# Patient Record
Sex: Female | Born: 1940 | Race: White | Hispanic: No | Marital: Married | Smoking: Never smoker
Health system: Southern US, Community
[De-identification: ages and names within clinical notes are randomized; demographics above are authoritative.]

## PROBLEM LIST (undated history)

## (undated) ENCOUNTER — Emergency Department (HOSPITAL_COMMUNITY): Admission: EM | Discharge status: Against Medical Advice | Payer: Medicare Other

## (undated) DIAGNOSIS — Z9981 Dependence on supplemental oxygen: Secondary | ICD-10-CM

## (undated) DIAGNOSIS — I513 Intracardiac thrombosis, not elsewhere classified: Secondary | ICD-10-CM

## (undated) DIAGNOSIS — E039 Hypothyroidism, unspecified: Secondary | ICD-10-CM

## (undated) DIAGNOSIS — Z95810 Presence of automatic (implantable) cardiac defibrillator: Secondary | ICD-10-CM

## (undated) DIAGNOSIS — N184 Chronic kidney disease, stage 4 (severe): Secondary | ICD-10-CM

## (undated) DIAGNOSIS — E785 Hyperlipidemia, unspecified: Secondary | ICD-10-CM

## (undated) DIAGNOSIS — E559 Vitamin D deficiency, unspecified: Secondary | ICD-10-CM

## (undated) DIAGNOSIS — E118 Type 2 diabetes mellitus with unspecified complications: Secondary | ICD-10-CM

## (undated) DIAGNOSIS — I10 Essential (primary) hypertension: Secondary | ICD-10-CM

## (undated) DIAGNOSIS — I480 Paroxysmal atrial fibrillation: Secondary | ICD-10-CM

## (undated) DIAGNOSIS — I255 Ischemic cardiomyopathy: Secondary | ICD-10-CM

## (undated) DIAGNOSIS — I2510 Atherosclerotic heart disease of native coronary artery without angina pectoris: Secondary | ICD-10-CM

## (undated) DIAGNOSIS — I5042 Chronic combined systolic (congestive) and diastolic (congestive) heart failure: Secondary | ICD-10-CM

## (undated) DIAGNOSIS — Z950 Presence of cardiac pacemaker: Secondary | ICD-10-CM

## (undated) DIAGNOSIS — Z9989 Dependence on other enabling machines and devices: Secondary | ICD-10-CM

## (undated) DIAGNOSIS — J449 Chronic obstructive pulmonary disease, unspecified: Secondary | ICD-10-CM

## (undated) DIAGNOSIS — H6120 Impacted cerumen, unspecified ear: Secondary | ICD-10-CM

## (undated) DIAGNOSIS — R0902 Hypoxemia: Secondary | ICD-10-CM

## (undated) DIAGNOSIS — S32000A Wedge compression fracture of unspecified lumbar vertebra, initial encounter for closed fracture: Secondary | ICD-10-CM

## (undated) DIAGNOSIS — M8008XA Age-related osteoporosis with current pathological fracture, vertebra(e), initial encounter for fracture: Secondary | ICD-10-CM

## (undated) DIAGNOSIS — M549 Dorsalgia, unspecified: Secondary | ICD-10-CM

## (undated) DIAGNOSIS — G8929 Other chronic pain: Secondary | ICD-10-CM

## (undated) DIAGNOSIS — I447 Left bundle-branch block, unspecified: Secondary | ICD-10-CM

## (undated) DIAGNOSIS — G4733 Obstructive sleep apnea (adult) (pediatric): Secondary | ICD-10-CM

## (undated) DIAGNOSIS — R32 Unspecified urinary incontinence: Secondary | ICD-10-CM

## (undated) DIAGNOSIS — M109 Gout, unspecified: Secondary | ICD-10-CM

## (undated) DIAGNOSIS — E13319 Other specified diabetes mellitus with unspecified diabetic retinopathy without macular edema: Secondary | ICD-10-CM

## (undated) DIAGNOSIS — Z6841 Body Mass Index (BMI) 40.0 and over, adult: Secondary | ICD-10-CM

## (undated) DIAGNOSIS — C8595 Non-Hodgkin lymphoma, unspecified, lymph nodes of inguinal region and lower limb: Secondary | ICD-10-CM

## (undated) DIAGNOSIS — Z8781 Personal history of (healed) traumatic fracture: Secondary | ICD-10-CM

## (undated) DIAGNOSIS — D649 Anemia, unspecified: Secondary | ICD-10-CM

## (undated) DIAGNOSIS — I6529 Occlusion and stenosis of unspecified carotid artery: Secondary | ICD-10-CM

## (undated) HISTORY — DX: Left bundle-branch block, unspecified: I44.7

## (undated) HISTORY — DX: Chronic obstructive pulmonary disease, unspecified: J44.9

## (undated) HISTORY — DX: Hypoxemia: Z99.81

## (undated) HISTORY — DX: Dorsalgia, unspecified: M54.9

## (undated) HISTORY — DX: Essential (primary) hypertension: I10

## (undated) HISTORY — DX: Age-related osteoporosis with current pathological fracture, vertebra(e), initial encounter for fracture: M80.08XA

## (undated) HISTORY — DX: Hyperlipidemia, unspecified: E78.5

## (undated) HISTORY — DX: Hypoxemia: R09.02

## (undated) HISTORY — DX: Vitamin D deficiency, unspecified: E55.9

## (undated) HISTORY — DX: Ischemic cardiomyopathy: I25.5

## (undated) HISTORY — DX: Impacted cerumen, unspecified ear: H61.20

## (undated) HISTORY — DX: Hypercalcemia: E83.52

## (undated) HISTORY — DX: Unspecified urinary incontinence: R32

## (undated) HISTORY — PX: REFRACTIVE SURGERY: SHX103

## (undated) HISTORY — PX: INSERT / REPLACE / REMOVE PACEMAKER: SUR710

## (undated) HISTORY — DX: Occlusion and stenosis of unspecified carotid artery: I65.29

## (undated) HISTORY — PX: CATARACT EXTRACTION: SUR2

## (undated) HISTORY — DX: Presence of automatic (implantable) cardiac defibrillator: Z95.810

## (undated) HISTORY — DX: Other chronic pain: G89.29

## (undated) HISTORY — DX: Personal history of (healed) traumatic fracture: Z87.81

## (undated) HISTORY — DX: Anemia, unspecified: D64.9

## (undated) HISTORY — DX: Other specified diabetes mellitus with unspecified diabetic retinopathy without macular edema: E13.319

---

## 1968-05-14 HISTORY — PX: CHOLECYSTECTOMY: SHX55

## 1970-05-14 HISTORY — PX: TUBAL LIGATION: SHX77

## 1980-05-14 HISTORY — PX: ABDOMINAL HYSTERECTOMY: SHX81

## 1997-09-17 ENCOUNTER — Encounter: Admission: RE | Admit: 1997-09-17 | Discharge: 1997-12-16 | Payer: Self-pay | Admitting: Internal Medicine

## 1999-03-12 ENCOUNTER — Emergency Department (HOSPITAL_COMMUNITY): Admission: EM | Admit: 1999-03-12 | Discharge: 1999-03-12 | Payer: Self-pay | Admitting: Emergency Medicine

## 1999-03-14 ENCOUNTER — Emergency Department (HOSPITAL_COMMUNITY): Admission: EM | Admit: 1999-03-14 | Discharge: 1999-03-14 | Payer: Self-pay | Admitting: Emergency Medicine

## 1999-11-06 ENCOUNTER — Encounter: Admission: RE | Admit: 1999-11-06 | Discharge: 2000-02-04 | Payer: Self-pay | Admitting: Endocrinology

## 2001-05-14 HISTORY — PX: CORONARY ARTERY BYPASS GRAFT: SHX141

## 2001-06-03 ENCOUNTER — Inpatient Hospital Stay (HOSPITAL_COMMUNITY): Admission: AD | Admit: 2001-06-03 | Discharge: 2001-06-09 | Payer: Self-pay | Admitting: Cardiology

## 2001-06-03 ENCOUNTER — Encounter: Payer: Self-pay | Admitting: Cardiology

## 2001-06-03 ENCOUNTER — Encounter: Payer: Self-pay | Admitting: Surgery

## 2001-06-03 HISTORY — PX: CARDIAC CATHETERIZATION: SHX172

## 2001-06-04 ENCOUNTER — Encounter: Payer: Self-pay | Admitting: Surgery

## 2001-06-05 ENCOUNTER — Encounter: Payer: Self-pay | Admitting: Surgery

## 2001-06-06 ENCOUNTER — Encounter: Payer: Self-pay | Admitting: Surgery

## 2001-07-08 ENCOUNTER — Encounter (HOSPITAL_COMMUNITY): Admission: RE | Admit: 2001-07-08 | Discharge: 2001-10-06 | Payer: Self-pay | Admitting: Cardiology

## 2001-08-11 ENCOUNTER — Ambulatory Visit (HOSPITAL_BASED_OUTPATIENT_CLINIC_OR_DEPARTMENT_OTHER): Admission: RE | Admit: 2001-08-11 | Discharge: 2001-08-11 | Payer: Self-pay | Admitting: Cardiology

## 2001-12-02 ENCOUNTER — Ambulatory Visit (HOSPITAL_BASED_OUTPATIENT_CLINIC_OR_DEPARTMENT_OTHER): Admission: RE | Admit: 2001-12-02 | Discharge: 2001-12-02 | Payer: Self-pay | Admitting: Pulmonary Disease

## 2001-12-18 ENCOUNTER — Emergency Department (HOSPITAL_COMMUNITY): Admission: EM | Admit: 2001-12-18 | Discharge: 2001-12-18 | Payer: Self-pay

## 2002-04-15 ENCOUNTER — Encounter: Admission: RE | Admit: 2002-04-15 | Discharge: 2002-07-14 | Payer: Self-pay | Admitting: Pulmonary Disease

## 2002-04-22 ENCOUNTER — Ambulatory Visit: Admission: RE | Admit: 2002-04-22 | Discharge: 2002-04-22 | Payer: Self-pay | Admitting: Cardiology

## 2002-04-29 ENCOUNTER — Encounter (HOSPITAL_COMMUNITY): Admission: RE | Admit: 2002-04-29 | Discharge: 2002-07-28 | Payer: Self-pay | Admitting: Cardiology

## 2003-09-30 ENCOUNTER — Encounter
Admission: RE | Admit: 2003-09-30 | Discharge: 2003-09-30 | Payer: Self-pay | Admitting: Physical Medicine and Rehabilitation

## 2003-10-18 ENCOUNTER — Ambulatory Visit (HOSPITAL_COMMUNITY): Admission: RE | Admit: 2003-10-18 | Discharge: 2003-10-18 | Payer: Self-pay | Admitting: Endocrinology

## 2004-01-10 ENCOUNTER — Emergency Department (HOSPITAL_COMMUNITY): Admission: EM | Admit: 2004-01-10 | Discharge: 2004-01-10 | Payer: Self-pay | Admitting: Emergency Medicine

## 2004-03-23 ENCOUNTER — Ambulatory Visit: Payer: Self-pay | Admitting: Internal Medicine

## 2004-03-31 ENCOUNTER — Ambulatory Visit: Payer: Self-pay | Admitting: Cardiology

## 2004-04-20 ENCOUNTER — Ambulatory Visit: Payer: Self-pay | Admitting: Internal Medicine

## 2004-04-20 ENCOUNTER — Ambulatory Visit: Payer: Self-pay | Admitting: Cardiology

## 2004-05-02 ENCOUNTER — Ambulatory Visit: Payer: Self-pay | Admitting: Cardiology

## 2004-05-12 ENCOUNTER — Ambulatory Visit: Payer: Self-pay | Admitting: Internal Medicine

## 2004-05-23 ENCOUNTER — Ambulatory Visit: Payer: Self-pay | Admitting: Internal Medicine

## 2004-05-30 ENCOUNTER — Ambulatory Visit: Payer: Self-pay

## 2004-06-07 ENCOUNTER — Ambulatory Visit: Payer: Self-pay | Admitting: Cardiology

## 2004-06-14 ENCOUNTER — Ambulatory Visit: Payer: Self-pay | Admitting: Internal Medicine

## 2004-07-05 ENCOUNTER — Ambulatory Visit: Payer: Self-pay | Admitting: Cardiology

## 2004-07-05 ENCOUNTER — Ambulatory Visit: Payer: Self-pay | Admitting: Internal Medicine

## 2004-07-13 ENCOUNTER — Ambulatory Visit: Payer: Self-pay | Admitting: Cardiology

## 2004-07-20 ENCOUNTER — Ambulatory Visit: Payer: Self-pay | Admitting: *Deleted

## 2004-07-25 ENCOUNTER — Ambulatory Visit: Payer: Self-pay | Admitting: Internal Medicine

## 2004-08-10 ENCOUNTER — Ambulatory Visit: Payer: Self-pay | Admitting: Internal Medicine

## 2004-09-07 ENCOUNTER — Ambulatory Visit: Payer: Self-pay | Admitting: Cardiology

## 2004-09-07 ENCOUNTER — Ambulatory Visit: Payer: Self-pay | Admitting: Internal Medicine

## 2004-09-21 ENCOUNTER — Ambulatory Visit: Payer: Self-pay | Admitting: Internal Medicine

## 2004-10-05 ENCOUNTER — Ambulatory Visit: Payer: Self-pay | Admitting: Cardiology

## 2004-10-23 ENCOUNTER — Ambulatory Visit: Payer: Self-pay | Admitting: Internal Medicine

## 2004-10-25 ENCOUNTER — Ambulatory Visit: Payer: Self-pay | Admitting: Internal Medicine

## 2004-11-07 ENCOUNTER — Ambulatory Visit: Payer: Self-pay | Admitting: Cardiology

## 2004-11-21 ENCOUNTER — Ambulatory Visit: Payer: Self-pay | Admitting: *Deleted

## 2004-11-28 ENCOUNTER — Ambulatory Visit: Payer: Self-pay | Admitting: Cardiology

## 2004-12-06 ENCOUNTER — Ambulatory Visit: Payer: Self-pay | Admitting: Cardiology

## 2004-12-13 ENCOUNTER — Ambulatory Visit: Payer: Self-pay | Admitting: *Deleted

## 2004-12-20 ENCOUNTER — Ambulatory Visit: Payer: Self-pay | Admitting: Cardiology

## 2004-12-27 ENCOUNTER — Ambulatory Visit: Payer: Self-pay | Admitting: Cardiology

## 2005-01-02 ENCOUNTER — Ambulatory Visit: Payer: Self-pay | Admitting: Cardiology

## 2005-01-12 ENCOUNTER — Ambulatory Visit: Payer: Self-pay | Admitting: *Deleted

## 2005-01-23 ENCOUNTER — Ambulatory Visit: Payer: Self-pay | Admitting: Internal Medicine

## 2005-01-23 ENCOUNTER — Ambulatory Visit: Payer: Self-pay

## 2005-01-29 ENCOUNTER — Emergency Department (HOSPITAL_COMMUNITY): Admission: EM | Admit: 2005-01-29 | Discharge: 2005-01-29 | Payer: Self-pay | Admitting: Emergency Medicine

## 2005-01-30 ENCOUNTER — Ambulatory Visit: Payer: Self-pay | Admitting: Internal Medicine

## 2005-02-05 ENCOUNTER — Ambulatory Visit: Payer: Self-pay | Admitting: Internal Medicine

## 2005-02-19 ENCOUNTER — Ambulatory Visit: Payer: Self-pay | Admitting: Cardiology

## 2005-03-01 ENCOUNTER — Ambulatory Visit: Payer: Self-pay | Admitting: Internal Medicine

## 2005-03-06 ENCOUNTER — Ambulatory Visit: Payer: Self-pay | Admitting: Internal Medicine

## 2005-03-15 ENCOUNTER — Ambulatory Visit: Payer: Self-pay | Admitting: Internal Medicine

## 2005-03-16 ENCOUNTER — Ambulatory Visit: Payer: Self-pay | Admitting: Cardiology

## 2005-03-30 ENCOUNTER — Ambulatory Visit: Payer: Self-pay | Admitting: Cardiology

## 2005-04-13 ENCOUNTER — Encounter: Payer: Self-pay | Admitting: Internal Medicine

## 2005-04-13 ENCOUNTER — Ambulatory Visit (HOSPITAL_COMMUNITY): Admission: RE | Admit: 2005-04-13 | Discharge: 2005-04-13 | Payer: Self-pay | Admitting: Cardiology

## 2005-04-26 ENCOUNTER — Ambulatory Visit: Payer: Self-pay | Admitting: *Deleted

## 2005-05-15 ENCOUNTER — Ambulatory Visit: Payer: Self-pay | Admitting: Internal Medicine

## 2005-05-17 ENCOUNTER — Ambulatory Visit: Payer: Self-pay | Admitting: Internal Medicine

## 2005-05-23 ENCOUNTER — Ambulatory Visit: Payer: Self-pay | Admitting: *Deleted

## 2005-05-25 ENCOUNTER — Ambulatory Visit: Payer: Self-pay | Admitting: Internal Medicine

## 2005-05-31 ENCOUNTER — Ambulatory Visit: Payer: Self-pay | Admitting: Cardiology

## 2005-06-07 ENCOUNTER — Ambulatory Visit: Payer: Self-pay | Admitting: Cardiology

## 2005-06-15 ENCOUNTER — Ambulatory Visit: Payer: Self-pay | Admitting: Internal Medicine

## 2005-06-15 ENCOUNTER — Ambulatory Visit: Payer: Self-pay | Admitting: Cardiology

## 2005-06-29 ENCOUNTER — Ambulatory Visit: Payer: Self-pay | Admitting: Cardiology

## 2005-06-29 ENCOUNTER — Ambulatory Visit: Payer: Self-pay | Admitting: Internal Medicine

## 2005-07-02 ENCOUNTER — Ambulatory Visit: Payer: Self-pay | Admitting: Internal Medicine

## 2005-07-05 ENCOUNTER — Ambulatory Visit: Payer: Self-pay | Admitting: Internal Medicine

## 2005-07-12 ENCOUNTER — Ambulatory Visit: Payer: Self-pay

## 2005-07-16 ENCOUNTER — Ambulatory Visit: Payer: Self-pay | Admitting: Internal Medicine

## 2005-07-23 ENCOUNTER — Ambulatory Visit: Payer: Self-pay | Admitting: Internal Medicine

## 2005-07-24 ENCOUNTER — Ambulatory Visit: Payer: Self-pay | Admitting: *Deleted

## 2005-08-02 ENCOUNTER — Ambulatory Visit: Payer: Self-pay | Admitting: Internal Medicine

## 2005-08-02 ENCOUNTER — Ambulatory Visit (HOSPITAL_COMMUNITY): Admission: RE | Admit: 2005-08-02 | Discharge: 2005-08-03 | Payer: Self-pay | Admitting: Internal Medicine

## 2005-08-06 ENCOUNTER — Ambulatory Visit: Payer: Self-pay | Admitting: Internal Medicine

## 2005-08-09 ENCOUNTER — Encounter: Payer: Self-pay | Admitting: Internal Medicine

## 2005-08-09 ENCOUNTER — Ambulatory Visit: Payer: Self-pay

## 2005-08-14 ENCOUNTER — Ambulatory Visit: Payer: Self-pay | Admitting: Cardiology

## 2005-08-15 ENCOUNTER — Ambulatory Visit: Payer: Self-pay

## 2005-08-30 ENCOUNTER — Ambulatory Visit: Payer: Self-pay | Admitting: Internal Medicine

## 2005-09-12 ENCOUNTER — Ambulatory Visit: Payer: Self-pay | Admitting: Internal Medicine

## 2005-09-25 ENCOUNTER — Ambulatory Visit: Payer: Self-pay | Admitting: Cardiology

## 2005-10-05 ENCOUNTER — Ambulatory Visit: Payer: Self-pay | Admitting: Cardiology

## 2005-10-12 ENCOUNTER — Ambulatory Visit: Payer: Self-pay | Admitting: Internal Medicine

## 2005-10-16 ENCOUNTER — Ambulatory Visit: Payer: Self-pay | Admitting: Cardiology

## 2005-10-30 ENCOUNTER — Ambulatory Visit: Payer: Self-pay | Admitting: Cardiology

## 2005-11-15 ENCOUNTER — Ambulatory Visit: Payer: Self-pay | Admitting: Cardiology

## 2005-11-15 ENCOUNTER — Ambulatory Visit: Payer: Self-pay | Admitting: Internal Medicine

## 2005-11-21 ENCOUNTER — Ambulatory Visit: Payer: Self-pay | Admitting: Internal Medicine

## 2005-11-21 ENCOUNTER — Encounter: Payer: Self-pay | Admitting: Cardiology

## 2005-11-30 ENCOUNTER — Ambulatory Visit: Payer: Self-pay | Admitting: Cardiology

## 2005-12-12 ENCOUNTER — Ambulatory Visit: Payer: Self-pay | Admitting: *Deleted

## 2005-12-24 ENCOUNTER — Ambulatory Visit: Payer: Self-pay | Admitting: Cardiovascular Disease

## 2006-01-08 ENCOUNTER — Ambulatory Visit: Payer: Self-pay | Admitting: Cardiology

## 2006-01-17 ENCOUNTER — Ambulatory Visit: Payer: Self-pay | Admitting: Internal Medicine

## 2006-01-31 ENCOUNTER — Ambulatory Visit: Payer: Self-pay | Admitting: Cardiology

## 2006-02-28 ENCOUNTER — Ambulatory Visit: Payer: Self-pay | Admitting: Internal Medicine

## 2006-03-19 ENCOUNTER — Ambulatory Visit: Payer: Self-pay | Admitting: Internal Medicine

## 2006-03-28 ENCOUNTER — Ambulatory Visit: Payer: Self-pay | Admitting: Cardiology

## 2006-04-22 ENCOUNTER — Ambulatory Visit: Payer: Self-pay | Admitting: Internal Medicine

## 2006-04-22 LAB — CONVERTED CEMR LAB
ALT: 20 units/L (ref 0–40)
AST: 24 units/L (ref 0–37)
Albumin: 3.2 g/dL — ABNORMAL LOW (ref 3.5–5.2)
BUN: 47 mg/dL — ABNORMAL HIGH (ref 6–23)
Basophils Absolute: 0 10*3/uL (ref 0.0–0.1)
Basophils Relative: 0.3 % (ref 0.0–1.0)
Bilirubin Urine: NEGATIVE
Calcium: 9.9 mg/dL (ref 8.4–10.5)
Chloride: 96 meq/L (ref 96–112)
Cholesterol: 250 mg/dL (ref 0–200)
GFR calc non Af Amer: 30 mL/min
Glucose, Bld: 276 mg/dL — ABNORMAL HIGH (ref 70–99)
HDL: 42.1 mg/dL (ref >39.0)
Hemoglobin, Urine: NEGATIVE
Iron: 90 ug/dL (ref 42–145)
LDL Cholesterol: 4 mg/dL (ref 0–99)
LDL DIRECT: 36.4 mg/dL
Monocytes Absolute: 0.6 10*3/uL (ref 0.2–0.7)
Monocytes Relative: 11.5 % — ABNORMAL HIGH (ref 3.0–11.0)
Neutro Abs: 3.7 10*3/uL (ref 1.4–7.7)
Neutrophils Relative %: 65.4 % (ref 43.0–77.0)
Phosphorus Concentration: 4.2 mg/dL (ref 2.3–4.6)
Specific Gravity, Urine: 1.01 (ref 1.000–1.03)
Triglyceride fasting, serum: 1018 mg/dL (ref 0–149)
Urine Glucose: 100 mg/dL — AB
VLDL: 204 mg/dL — ABNORMAL HIGH (ref 0–40)
pH: 6.5 (ref 5.0–8.0)

## 2006-04-25 ENCOUNTER — Ambulatory Visit: Payer: Self-pay | Admitting: Cardiology

## 2006-04-29 ENCOUNTER — Ambulatory Visit: Payer: Self-pay | Admitting: Internal Medicine

## 2006-05-23 ENCOUNTER — Ambulatory Visit: Payer: Self-pay | Admitting: Cardiology

## 2006-05-31 ENCOUNTER — Ambulatory Visit: Payer: Self-pay | Admitting: Internal Medicine

## 2006-06-20 ENCOUNTER — Ambulatory Visit: Payer: Self-pay | Admitting: Cardiology

## 2006-07-11 ENCOUNTER — Ambulatory Visit: Payer: Self-pay | Admitting: *Deleted

## 2006-07-11 ENCOUNTER — Ambulatory Visit: Payer: Self-pay | Admitting: Cardiology

## 2006-07-29 ENCOUNTER — Ambulatory Visit: Payer: Self-pay | Admitting: Cardiology

## 2006-08-01 ENCOUNTER — Ambulatory Visit: Payer: Self-pay | Admitting: Internal Medicine

## 2006-08-05 ENCOUNTER — Encounter: Payer: Self-pay | Admitting: Cardiology

## 2006-08-05 ENCOUNTER — Ambulatory Visit: Payer: Self-pay

## 2006-08-08 ENCOUNTER — Ambulatory Visit: Payer: Self-pay | Admitting: Cardiology

## 2006-08-26 ENCOUNTER — Ambulatory Visit: Payer: Self-pay | Admitting: Internal Medicine

## 2006-08-29 ENCOUNTER — Ambulatory Visit: Payer: Self-pay | Admitting: Internal Medicine

## 2006-09-02 ENCOUNTER — Ambulatory Visit: Payer: Self-pay | Admitting: Internal Medicine

## 2006-09-23 ENCOUNTER — Ambulatory Visit: Payer: Self-pay | Admitting: Internal Medicine

## 2006-10-10 ENCOUNTER — Ambulatory Visit: Payer: Self-pay | Admitting: *Deleted

## 2006-10-11 ENCOUNTER — Ambulatory Visit: Payer: Self-pay | Admitting: Internal Medicine

## 2006-10-21 ENCOUNTER — Ambulatory Visit: Payer: Self-pay | Admitting: Internal Medicine

## 2006-10-21 LAB — CONVERTED CEMR LAB
Cholesterol: 236 mg/dL (ref 0–200)
GFR calc Af Amer: 39 mL/min
GFR calc non Af Amer: 32 mL/min
Glucose, Bld: 241 mg/dL — ABNORMAL HIGH (ref 70–99)
Hgb A1c MFr Bld: 11.4 % — ABNORMAL HIGH (ref 4.6–6.0)
Sodium: 137 meq/L (ref 135–145)
Total CHOL/HDL Ratio: 6.1
Triglycerides: 880 mg/dL (ref 0–149)

## 2006-10-31 ENCOUNTER — Ambulatory Visit: Payer: Self-pay | Admitting: Internal Medicine

## 2006-11-18 ENCOUNTER — Ambulatory Visit: Payer: Self-pay | Admitting: Internal Medicine

## 2006-11-20 DIAGNOSIS — I509 Heart failure, unspecified: Secondary | ICD-10-CM | POA: Insufficient documentation

## 2006-11-20 DIAGNOSIS — I10 Essential (primary) hypertension: Secondary | ICD-10-CM

## 2006-11-21 ENCOUNTER — Ambulatory Visit: Payer: Self-pay | Admitting: Cardiovascular Disease

## 2006-11-21 ENCOUNTER — Ambulatory Visit: Payer: Self-pay | Admitting: Internal Medicine

## 2006-12-05 ENCOUNTER — Ambulatory Visit: Payer: Self-pay | Admitting: Cardiology

## 2006-12-26 ENCOUNTER — Ambulatory Visit: Payer: Self-pay | Admitting: Cardiovascular Disease

## 2007-01-09 ENCOUNTER — Ambulatory Visit: Payer: Self-pay | Admitting: Cardiology

## 2007-01-16 ENCOUNTER — Ambulatory Visit: Payer: Self-pay | Admitting: Internal Medicine

## 2007-02-06 ENCOUNTER — Ambulatory Visit: Payer: Self-pay | Admitting: Cardiology

## 2007-02-06 ENCOUNTER — Encounter: Payer: Self-pay | Admitting: Internal Medicine

## 2007-02-06 ENCOUNTER — Ambulatory Visit: Payer: Self-pay | Admitting: Internal Medicine

## 2007-03-03 ENCOUNTER — Ambulatory Visit: Payer: Self-pay | Admitting: Internal Medicine

## 2007-03-06 ENCOUNTER — Ambulatory Visit: Payer: Self-pay | Admitting: Cardiology

## 2007-03-12 ENCOUNTER — Encounter: Payer: Self-pay | Admitting: Internal Medicine

## 2007-03-19 ENCOUNTER — Ambulatory Visit: Payer: Self-pay | Admitting: Internal Medicine

## 2007-03-19 ENCOUNTER — Telehealth: Payer: Self-pay | Admitting: Internal Medicine

## 2007-03-19 DIAGNOSIS — E113299 Type 2 diabetes mellitus with mild nonproliferative diabetic retinopathy without macular edema, unspecified eye: Secondary | ICD-10-CM | POA: Insufficient documentation

## 2007-03-19 DIAGNOSIS — E11319 Type 2 diabetes mellitus with unspecified diabetic retinopathy without macular edema: Secondary | ICD-10-CM

## 2007-03-19 DIAGNOSIS — E785 Hyperlipidemia, unspecified: Secondary | ICD-10-CM | POA: Insufficient documentation

## 2007-03-19 DIAGNOSIS — B373 Candidiasis of vulva and vagina: Secondary | ICD-10-CM

## 2007-04-04 ENCOUNTER — Ambulatory Visit: Payer: Self-pay | Admitting: Internal Medicine

## 2007-04-04 ENCOUNTER — Encounter: Payer: Self-pay | Admitting: Internal Medicine

## 2007-04-11 ENCOUNTER — Encounter: Payer: Self-pay | Admitting: Internal Medicine

## 2007-04-17 ENCOUNTER — Ambulatory Visit: Payer: Self-pay | Admitting: Internal Medicine

## 2007-04-17 DIAGNOSIS — E039 Hypothyroidism, unspecified: Secondary | ICD-10-CM | POA: Insufficient documentation

## 2007-04-19 LAB — CONVERTED CEMR LAB
Alkaline Phosphatase: 53 units/L (ref 39–117)
BUN: 38 mg/dL — ABNORMAL HIGH (ref 6–23)
CO2: 30 meq/L (ref 19–32)
Creatinine,U: 110.6 mg/dL
GFR calc Af Amer: 36 mL/min
HDL: 45 mg/dL (ref >39.0)
Microalb Creat Ratio: 31.6 mg/g — ABNORMAL HIGH (ref 0.0–30.0)
Microalb, Ur: 3.5 mg/dL — ABNORMAL HIGH (ref 0.0–1.9)
Potassium: 4.3 meq/L (ref 3.5–5.1)
Sodium: 138 meq/L (ref 135–145)
TSH: 2.9 microintl units/mL (ref 0.35–5.50)
Total Protein: 6.4 g/dL (ref 6.0–8.3)
Triglycerides: 1188 mg/dL (ref 0–149)

## 2007-05-20 ENCOUNTER — Ambulatory Visit: Payer: Self-pay | Admitting: Internal Medicine

## 2007-05-20 ENCOUNTER — Telehealth: Payer: Self-pay | Admitting: Internal Medicine

## 2007-05-20 DIAGNOSIS — I6529 Occlusion and stenosis of unspecified carotid artery: Secondary | ICD-10-CM

## 2007-05-20 DIAGNOSIS — R300 Dysuria: Secondary | ICD-10-CM

## 2007-05-21 ENCOUNTER — Encounter: Payer: Self-pay | Admitting: Internal Medicine

## 2007-05-21 LAB — CONVERTED CEMR LAB
Crystals: NEGATIVE
Hemoglobin, Urine: NEGATIVE
Mucus, UA: NEGATIVE
Total Protein, Urine: NEGATIVE mg/dL

## 2007-05-22 ENCOUNTER — Ambulatory Visit: Payer: Self-pay | Admitting: Internal Medicine

## 2007-05-28 ENCOUNTER — Ambulatory Visit: Payer: Self-pay | Admitting: Cardiovascular Disease

## 2007-05-28 ENCOUNTER — Encounter: Payer: Self-pay | Admitting: Internal Medicine

## 2007-05-28 ENCOUNTER — Ambulatory Visit: Payer: Self-pay

## 2007-05-29 ENCOUNTER — Ambulatory Visit: Payer: Self-pay | Admitting: Cardiology

## 2007-05-29 ENCOUNTER — Encounter: Payer: Self-pay | Admitting: Physician Assistant

## 2007-05-29 ENCOUNTER — Ambulatory Visit: Payer: Self-pay | Admitting: Internal Medicine

## 2007-06-03 ENCOUNTER — Encounter: Payer: Self-pay | Admitting: Internal Medicine

## 2007-06-09 ENCOUNTER — Encounter: Payer: Self-pay | Admitting: Internal Medicine

## 2007-06-18 ENCOUNTER — Telehealth: Payer: Self-pay | Admitting: Internal Medicine

## 2007-06-25 ENCOUNTER — Ambulatory Visit: Payer: Self-pay | Admitting: Cardiology

## 2007-06-25 ENCOUNTER — Encounter: Payer: Self-pay | Admitting: Internal Medicine

## 2007-06-27 ENCOUNTER — Encounter: Payer: Self-pay | Admitting: Internal Medicine

## 2007-07-04 ENCOUNTER — Encounter: Payer: Self-pay | Admitting: Internal Medicine

## 2007-07-08 ENCOUNTER — Encounter: Payer: Self-pay | Admitting: Internal Medicine

## 2007-07-09 ENCOUNTER — Ambulatory Visit: Payer: Self-pay | Admitting: Internal Medicine

## 2007-07-18 ENCOUNTER — Telehealth: Payer: Self-pay | Admitting: Internal Medicine

## 2007-07-21 ENCOUNTER — Encounter: Payer: Self-pay | Admitting: Internal Medicine

## 2007-07-22 ENCOUNTER — Encounter: Payer: Self-pay | Admitting: Internal Medicine

## 2007-08-06 ENCOUNTER — Ambulatory Visit: Payer: Self-pay | Admitting: Cardiology

## 2007-08-06 ENCOUNTER — Ambulatory Visit: Payer: Self-pay | Admitting: Internal Medicine

## 2007-08-07 LAB — CONVERTED CEMR LAB
ALT: 18 units/L (ref 0–35)
AST: 26 units/L (ref 0–37)
Albumin: 3.5 g/dL (ref 3.5–5.2)
BUN: 44 mg/dL — ABNORMAL HIGH (ref 6–23)
Bilirubin Urine: NEGATIVE
CO2: 32 meq/L (ref 19–32)
Calcium: 9.7 mg/dL (ref 8.4–10.5)
Chloride: 100 meq/L (ref 96–112)
Cholesterol: 210 mg/dL (ref 0–200)
Creatinine, Ser: 1.6 mg/dL — ABNORMAL HIGH (ref 0.4–1.2)
Direct LDL: 36.1 mg/dL
HDL: 35.2 mg/dL — ABNORMAL LOW (ref >39.0)
Hemoglobin, Urine: NEGATIVE
Hgb A1c MFr Bld: 8.7 % — ABNORMAL HIGH (ref 4.6–6.0)
Nitrite: NEGATIVE
Specific Gravity, Urine: 1.015 (ref 1.000–1.03)
Total Bilirubin: 0.6 mg/dL (ref 0.3–1.2)
Total CHOL/HDL Ratio: 6
Total Protein, Urine: NEGATIVE mg/dL
Triglycerides: 1026 mg/dL (ref 0–149)
Urine Glucose: NEGATIVE mg/dL
Urobilinogen, UA: 0.2 (ref 0.0–1.0)
pH: 6 (ref 5.0–8.0)

## 2007-08-19 ENCOUNTER — Ambulatory Visit: Payer: Self-pay | Admitting: Internal Medicine

## 2007-09-01 ENCOUNTER — Encounter: Payer: Self-pay | Admitting: Internal Medicine

## 2007-09-02 ENCOUNTER — Encounter: Payer: Self-pay | Admitting: Internal Medicine

## 2007-09-02 ENCOUNTER — Ambulatory Visit: Payer: Self-pay | Admitting: Internal Medicine

## 2007-09-03 ENCOUNTER — Ambulatory Visit: Payer: Self-pay | Admitting: Cardiology

## 2007-09-22 ENCOUNTER — Encounter: Payer: Self-pay | Admitting: Internal Medicine

## 2007-10-01 ENCOUNTER — Ambulatory Visit: Payer: Self-pay | Admitting: Cardiology

## 2007-10-27 ENCOUNTER — Encounter: Payer: Self-pay | Admitting: Internal Medicine

## 2007-10-29 ENCOUNTER — Ambulatory Visit: Payer: Self-pay | Admitting: Internal Medicine

## 2007-11-10 ENCOUNTER — Encounter: Payer: Self-pay | Admitting: Internal Medicine

## 2007-11-25 ENCOUNTER — Encounter: Payer: Self-pay | Admitting: Internal Medicine

## 2007-11-26 ENCOUNTER — Ambulatory Visit: Payer: Self-pay | Admitting: Cardiology

## 2007-12-03 ENCOUNTER — Ambulatory Visit: Payer: Self-pay | Admitting: Internal Medicine

## 2007-12-15 ENCOUNTER — Ambulatory Visit: Payer: Self-pay | Admitting: Cardiology

## 2007-12-16 ENCOUNTER — Ambulatory Visit: Payer: Self-pay | Admitting: Internal Medicine

## 2007-12-16 ENCOUNTER — Telehealth: Payer: Self-pay | Admitting: Internal Medicine

## 2007-12-16 LAB — CONVERTED CEMR LAB
CO2: 32 meq/L (ref 19–32)
Chloride: 96 meq/L (ref 96–112)
Creatinine,U: 140.3 mg/dL
Direct LDL: 39.9 mg/dL
Glucose, Bld: 218 mg/dL — ABNORMAL HIGH (ref 70–99)
Hgb A1c MFr Bld: 8.8 % — ABNORMAL HIGH (ref 4.6–6.0)
Microalb Creat Ratio: 14.3 mg/g (ref 0.0–30.0)
Microalb, Ur: 2 mg/dL — ABNORMAL HIGH (ref 0.0–1.9)
Potassium: 4.4 meq/L (ref 3.5–5.1)
Sodium: 137 meq/L (ref 135–145)
Total CHOL/HDL Ratio: 6.3

## 2007-12-23 ENCOUNTER — Ambulatory Visit: Payer: Self-pay | Admitting: Internal Medicine

## 2007-12-24 ENCOUNTER — Encounter: Payer: Self-pay | Admitting: Internal Medicine

## 2008-01-12 ENCOUNTER — Ambulatory Visit: Payer: Self-pay | Admitting: Cardiology

## 2008-01-26 ENCOUNTER — Ambulatory Visit: Payer: Self-pay | Admitting: Cardiology

## 2008-01-30 ENCOUNTER — Ambulatory Visit: Payer: Self-pay | Admitting: Cardiology

## 2008-02-06 ENCOUNTER — Encounter: Payer: Self-pay | Admitting: Cardiology

## 2008-02-06 ENCOUNTER — Ambulatory Visit: Payer: Self-pay | Admitting: Cardiology

## 2008-02-10 ENCOUNTER — Encounter: Payer: Self-pay | Admitting: Internal Medicine

## 2008-02-16 ENCOUNTER — Ambulatory Visit: Payer: Self-pay | Admitting: Cardiology

## 2008-02-18 ENCOUNTER — Ambulatory Visit: Payer: Self-pay | Admitting: Internal Medicine

## 2008-02-19 ENCOUNTER — Encounter: Payer: Self-pay | Admitting: Internal Medicine

## 2008-03-01 ENCOUNTER — Ambulatory Visit: Payer: Self-pay | Admitting: Internal Medicine

## 2008-03-15 ENCOUNTER — Ambulatory Visit: Payer: Self-pay | Admitting: Internal Medicine

## 2008-03-15 ENCOUNTER — Ambulatory Visit: Payer: Self-pay | Admitting: Cardiovascular Disease

## 2008-03-15 LAB — CONVERTED CEMR LAB
ALT: 21 units/L (ref 0–35)
AST: 26 units/L (ref 0–37)
Cholesterol: 262 mg/dL (ref 0–200)
Direct LDL: 28.6 mg/dL

## 2008-03-23 ENCOUNTER — Encounter: Payer: Self-pay | Admitting: Internal Medicine

## 2008-03-24 ENCOUNTER — Ambulatory Visit: Payer: Self-pay | Admitting: Internal Medicine

## 2008-03-26 ENCOUNTER — Ambulatory Visit: Payer: Self-pay | Admitting: Cardiovascular Disease

## 2008-04-12 ENCOUNTER — Ambulatory Visit: Payer: Self-pay | Admitting: Cardiovascular Disease

## 2008-04-14 ENCOUNTER — Encounter: Payer: Self-pay | Admitting: Internal Medicine

## 2008-04-19 ENCOUNTER — Encounter: Payer: Self-pay | Admitting: Internal Medicine

## 2008-04-26 ENCOUNTER — Ambulatory Visit: Payer: Self-pay | Admitting: Cardiology

## 2008-05-04 ENCOUNTER — Ambulatory Visit: Payer: Self-pay | Admitting: Internal Medicine

## 2008-05-04 DIAGNOSIS — J018 Other acute sinusitis: Secondary | ICD-10-CM

## 2008-05-17 ENCOUNTER — Ambulatory Visit: Payer: Self-pay | Admitting: *Deleted

## 2008-05-17 ENCOUNTER — Encounter: Payer: Self-pay | Admitting: Internal Medicine

## 2008-05-17 ENCOUNTER — Ambulatory Visit: Payer: Self-pay | Admitting: Internal Medicine

## 2008-05-17 DIAGNOSIS — N3000 Acute cystitis without hematuria: Secondary | ICD-10-CM

## 2008-05-17 LAB — CONVERTED CEMR LAB

## 2008-05-21 ENCOUNTER — Encounter: Payer: Self-pay | Admitting: Internal Medicine

## 2008-05-31 ENCOUNTER — Ambulatory Visit: Payer: Self-pay | Admitting: Internal Medicine

## 2008-06-03 ENCOUNTER — Encounter: Payer: Self-pay | Admitting: Internal Medicine

## 2008-06-03 ENCOUNTER — Ambulatory Visit: Payer: Self-pay

## 2008-06-07 ENCOUNTER — Ambulatory Visit: Payer: Self-pay | Admitting: Cardiology

## 2008-06-15 ENCOUNTER — Ambulatory Visit: Payer: Self-pay | Admitting: Internal Medicine

## 2008-06-15 LAB — CONVERTED CEMR LAB
AST: 21 units/L (ref 0–37)
CO2: 30 meq/L (ref 19–32)
Chloride: 108 meq/L (ref 96–112)
Cholesterol: 196 mg/dL (ref 0–200)
Creatinine, Ser: 1.5 mg/dL — ABNORMAL HIGH (ref 0.4–1.2)
Direct LDL: 74.8 mg/dL
Glucose, Bld: 122 mg/dL — ABNORMAL HIGH (ref 70–99)
Hgb A1c MFr Bld: 8.6 % — ABNORMAL HIGH (ref 4.6–6.0)
Sodium: 142 meq/L (ref 135–145)
Total CHOL/HDL Ratio: 5.1

## 2008-06-24 ENCOUNTER — Ambulatory Visit: Payer: Self-pay | Admitting: Internal Medicine

## 2008-06-24 DIAGNOSIS — N390 Urinary tract infection, site not specified: Secondary | ICD-10-CM

## 2008-06-25 ENCOUNTER — Encounter: Payer: Self-pay | Admitting: Internal Medicine

## 2008-06-25 LAB — HM MAMMOGRAPHY: HM Mammogram: NORMAL

## 2008-07-05 ENCOUNTER — Ambulatory Visit: Payer: Self-pay | Admitting: Cardiology

## 2008-07-08 ENCOUNTER — Encounter: Payer: Self-pay | Admitting: Internal Medicine

## 2008-07-25 ENCOUNTER — Encounter: Payer: Self-pay | Admitting: Internal Medicine

## 2008-08-04 ENCOUNTER — Ambulatory Visit: Payer: Self-pay | Admitting: Internal Medicine

## 2008-08-17 ENCOUNTER — Ambulatory Visit (HOSPITAL_BASED_OUTPATIENT_CLINIC_OR_DEPARTMENT_OTHER): Admission: RE | Admit: 2008-08-17 | Discharge: 2008-08-17 | Payer: Self-pay | Admitting: Internal Medicine

## 2008-08-17 ENCOUNTER — Ambulatory Visit: Payer: Self-pay | Admitting: Diagnostic Radiology

## 2008-08-17 ENCOUNTER — Ambulatory Visit: Payer: Self-pay | Admitting: Internal Medicine

## 2008-08-17 DIAGNOSIS — R05 Cough: Secondary | ICD-10-CM

## 2008-08-26 DIAGNOSIS — I2589 Other forms of chronic ischemic heart disease: Secondary | ICD-10-CM

## 2008-08-26 DIAGNOSIS — M549 Dorsalgia, unspecified: Secondary | ICD-10-CM

## 2008-08-26 DIAGNOSIS — Z95810 Presence of automatic (implantable) cardiac defibrillator: Secondary | ICD-10-CM

## 2008-08-26 DIAGNOSIS — I447 Left bundle-branch block, unspecified: Secondary | ICD-10-CM

## 2008-08-26 DIAGNOSIS — G4730 Sleep apnea, unspecified: Secondary | ICD-10-CM | POA: Insufficient documentation

## 2008-08-26 DIAGNOSIS — Z8673 Personal history of transient ischemic attack (TIA), and cerebral infarction without residual deficits: Secondary | ICD-10-CM | POA: Insufficient documentation

## 2008-08-26 DIAGNOSIS — E1139 Type 2 diabetes mellitus with other diabetic ophthalmic complication: Secondary | ICD-10-CM | POA: Insufficient documentation

## 2008-08-27 ENCOUNTER — Encounter: Payer: Self-pay | Admitting: Internal Medicine

## 2008-08-27 ENCOUNTER — Ambulatory Visit: Payer: Self-pay | Admitting: Internal Medicine

## 2008-09-16 ENCOUNTER — Encounter: Payer: Self-pay | Admitting: Internal Medicine

## 2008-09-17 ENCOUNTER — Ambulatory Visit: Payer: Self-pay | Admitting: Internal Medicine

## 2008-09-17 ENCOUNTER — Ambulatory Visit: Payer: Self-pay | Admitting: Cardiovascular Disease

## 2008-09-17 LAB — CONVERTED CEMR LAB
BUN: 29 mg/dL — ABNORMAL HIGH (ref 6–23)
Cholesterol: 340 mg/dL — ABNORMAL HIGH (ref 0–200)
Creatinine, Ser: 1.6 mg/dL — ABNORMAL HIGH (ref 0.4–1.2)
GFR calc non Af Amer: 34.28 mL/min (ref >60)
HDL: 47.1 mg/dL (ref >39.00)
Total Bilirubin: 1 mg/dL (ref 0.3–1.2)
Total CHOL/HDL Ratio: 7
Triglycerides: 1666 mg/dL — ABNORMAL HIGH (ref 0.0–149.0)
VLDL: 333.2 mg/dL — ABNORMAL HIGH (ref 0.0–40.0)

## 2008-09-21 ENCOUNTER — Ambulatory Visit: Payer: Self-pay | Admitting: Internal Medicine

## 2008-09-28 ENCOUNTER — Telehealth: Payer: Self-pay | Admitting: Internal Medicine

## 2008-10-04 ENCOUNTER — Ambulatory Visit: Payer: Self-pay | Admitting: Cardiovascular Disease

## 2008-10-12 ENCOUNTER — Encounter: Payer: Self-pay | Admitting: *Deleted

## 2008-10-13 LAB — HM DIABETES FOOT EXAM

## 2008-10-18 ENCOUNTER — Ambulatory Visit: Payer: Self-pay | Admitting: Cardiovascular Disease

## 2008-10-21 ENCOUNTER — Ambulatory Visit: Payer: Self-pay | Admitting: Internal Medicine

## 2008-10-21 DIAGNOSIS — R5381 Other malaise: Secondary | ICD-10-CM

## 2008-10-21 DIAGNOSIS — R5383 Other fatigue: Secondary | ICD-10-CM

## 2008-11-01 ENCOUNTER — Ambulatory Visit: Payer: Self-pay | Admitting: Internal Medicine

## 2008-11-01 ENCOUNTER — Encounter: Payer: Self-pay | Admitting: Internal Medicine

## 2008-11-16 ENCOUNTER — Ambulatory Visit: Payer: Self-pay | Admitting: Cardiology

## 2008-11-16 LAB — CONVERTED CEMR LAB
POC INR: 2.4
Prothrombin Time: 18.8 s

## 2008-11-17 ENCOUNTER — Encounter: Payer: Self-pay | Admitting: *Deleted

## 2008-11-22 ENCOUNTER — Ambulatory Visit: Payer: Self-pay | Admitting: Internal Medicine

## 2008-11-23 ENCOUNTER — Encounter: Payer: Self-pay | Admitting: Internal Medicine

## 2008-12-01 ENCOUNTER — Encounter: Payer: Self-pay | Admitting: Internal Medicine

## 2008-12-14 ENCOUNTER — Ambulatory Visit: Payer: Self-pay | Admitting: Internal Medicine

## 2008-12-14 ENCOUNTER — Ambulatory Visit: Payer: Self-pay | Admitting: Cardiology

## 2008-12-14 LAB — CONVERTED CEMR LAB
Albumin: 3.2 g/dL — ABNORMAL LOW (ref 3.5–5.2)
Alkaline Phosphatase: 67 units/L (ref 39–117)
Calcium: 9.2 mg/dL (ref 8.4–10.5)
Direct LDL: 91.9 mg/dL
GFR calc non Af Amer: 36.9 mL/min (ref >60)
Glucose, Bld: 207 mg/dL — ABNORMAL HIGH (ref 70–99)
HDL: 42.7 mg/dL (ref >39.00)
POC INR: 1.2
Prothrombin Time: 13.5 s
Sodium: 141 meq/L (ref 135–145)
VLDL: 133.8 mg/dL — ABNORMAL HIGH (ref 0.0–40.0)

## 2008-12-21 ENCOUNTER — Ambulatory Visit: Payer: Self-pay | Admitting: Internal Medicine

## 2008-12-24 ENCOUNTER — Encounter: Payer: Self-pay | Admitting: Internal Medicine

## 2009-01-04 ENCOUNTER — Ambulatory Visit: Payer: Self-pay | Admitting: Internal Medicine

## 2009-01-04 DIAGNOSIS — R197 Diarrhea, unspecified: Secondary | ICD-10-CM

## 2009-01-06 ENCOUNTER — Telehealth: Payer: Self-pay | Admitting: Internal Medicine

## 2009-01-06 ENCOUNTER — Encounter: Payer: Self-pay | Admitting: Internal Medicine

## 2009-01-11 ENCOUNTER — Ambulatory Visit: Payer: Self-pay | Admitting: Cardiology

## 2009-01-13 ENCOUNTER — Telehealth: Payer: Self-pay | Admitting: Internal Medicine

## 2009-01-13 ENCOUNTER — Ambulatory Visit: Payer: Self-pay | Admitting: Internal Medicine

## 2009-01-13 LAB — CONVERTED CEMR LAB
Bilirubin Urine: NEGATIVE
Nitrite: NEGATIVE
Total Protein, Urine: NEGATIVE mg/dL
pH: 6 (ref 5.0–8.0)

## 2009-01-15 ENCOUNTER — Encounter: Payer: Self-pay | Admitting: Internal Medicine

## 2009-01-18 ENCOUNTER — Telehealth: Payer: Self-pay | Admitting: Internal Medicine

## 2009-01-19 ENCOUNTER — Ambulatory Visit: Payer: Self-pay | Admitting: Cardiology

## 2009-01-21 ENCOUNTER — Ambulatory Visit: Payer: Self-pay | Admitting: Internal Medicine

## 2009-02-04 ENCOUNTER — Encounter: Payer: Self-pay | Admitting: Internal Medicine

## 2009-02-08 ENCOUNTER — Telehealth (INDEPENDENT_AMBULATORY_CARE_PROVIDER_SITE_OTHER): Payer: Self-pay | Admitting: *Deleted

## 2009-02-11 DIAGNOSIS — C8595 Non-Hodgkin lymphoma, unspecified, lymph nodes of inguinal region and lower limb: Secondary | ICD-10-CM

## 2009-02-11 HISTORY — DX: Non-Hodgkin lymphoma, unspecified, lymph nodes of inguinal region and lower limb: C85.95

## 2009-02-16 ENCOUNTER — Ambulatory Visit: Payer: Self-pay | Admitting: Internal Medicine

## 2009-02-22 ENCOUNTER — Encounter: Payer: Self-pay | Admitting: Internal Medicine

## 2009-02-28 ENCOUNTER — Ambulatory Visit: Payer: Self-pay | Admitting: Internal Medicine

## 2009-03-02 ENCOUNTER — Encounter: Payer: Self-pay | Admitting: Internal Medicine

## 2009-03-07 ENCOUNTER — Ambulatory Visit: Payer: Self-pay | Admitting: Cardiology

## 2009-03-07 DIAGNOSIS — I890 Lymphedema, not elsewhere classified: Secondary | ICD-10-CM

## 2009-03-07 DIAGNOSIS — I238 Other current complications following acute myocardial infarction: Secondary | ICD-10-CM

## 2009-03-08 ENCOUNTER — Ambulatory Visit: Payer: Self-pay | Admitting: Cardiovascular Disease

## 2009-03-08 ENCOUNTER — Encounter: Payer: Self-pay | Admitting: Cardiology

## 2009-03-08 LAB — CONVERTED CEMR LAB: POC INR: 2

## 2009-03-09 ENCOUNTER — Encounter: Payer: Self-pay | Admitting: Cardiology

## 2009-03-10 ENCOUNTER — Encounter: Payer: Self-pay | Admitting: Cardiology

## 2009-03-16 ENCOUNTER — Ambulatory Visit (HOSPITAL_COMMUNITY): Admission: RE | Admit: 2009-03-16 | Discharge: 2009-03-16 | Payer: Self-pay | Admitting: Surgery

## 2009-03-21 ENCOUNTER — Encounter (INDEPENDENT_AMBULATORY_CARE_PROVIDER_SITE_OTHER): Payer: Self-pay | Admitting: Interventional Radiology

## 2009-03-21 ENCOUNTER — Ambulatory Visit (HOSPITAL_COMMUNITY): Admission: RE | Admit: 2009-03-21 | Discharge: 2009-03-21 | Payer: Self-pay | Admitting: Surgery

## 2009-03-21 ENCOUNTER — Encounter (INDEPENDENT_AMBULATORY_CARE_PROVIDER_SITE_OTHER): Payer: Self-pay | Admitting: Surgery

## 2009-03-22 ENCOUNTER — Telehealth: Payer: Self-pay | Admitting: Cardiovascular Disease

## 2009-03-23 ENCOUNTER — Telehealth: Payer: Self-pay | Admitting: Cardiology

## 2009-03-25 ENCOUNTER — Ambulatory Visit: Payer: Self-pay | Admitting: Oncology

## 2009-03-25 ENCOUNTER — Encounter: Payer: Self-pay | Admitting: Internal Medicine

## 2009-03-28 ENCOUNTER — Telehealth (INDEPENDENT_AMBULATORY_CARE_PROVIDER_SITE_OTHER): Payer: Self-pay | Admitting: *Deleted

## 2009-03-28 ENCOUNTER — Telehealth: Payer: Self-pay | Admitting: Internal Medicine

## 2009-03-30 ENCOUNTER — Other Ambulatory Visit: Admission: RE | Admit: 2009-03-30 | Discharge: 2009-03-30 | Payer: Self-pay | Admitting: Oncology

## 2009-04-01 LAB — CBC WITH DIFFERENTIAL/PLATELET
BASO%: 0.4 % (ref 0.0–2.0)
EOS%: 2 % (ref 0.0–7.0)
HCT: 37.2 % (ref 34.8–46.6)
MCH: 31.5 pg (ref 25.1–34.0)
MCHC: 35.3 g/dL (ref 31.5–36.0)
MONO#: 0.5 10*3/uL (ref 0.1–0.9)
RDW: 13.7 % (ref 11.2–14.5)
WBC: 6.2 10*3/uL (ref 3.9–10.3)
lymph#: 1.1 10*3/uL (ref 0.9–3.3)

## 2009-04-01 LAB — COMPREHENSIVE METABOLIC PANEL
ALT: 14 U/L (ref 0–35)
BUN: 29 mg/dL — ABNORMAL HIGH (ref 6–23)
CO2: 28 mEq/L (ref 19–32)
Creatinine, Ser: 1.17 mg/dL (ref 0.40–1.20)
Total Bilirubin: 0.5 mg/dL (ref 0.3–1.2)

## 2009-04-01 LAB — LACTATE DEHYDROGENASE: LDH: 437 U/L — ABNORMAL HIGH (ref 94–250)

## 2009-04-01 LAB — HEPATITIS B CORE ANTIBODY, TOTAL: Hep B Core Total Ab: NEGATIVE

## 2009-04-01 LAB — HEPATITIS B SURFACE ANTIGEN: Hepatitis B Surface Ag: NEGATIVE

## 2009-04-04 ENCOUNTER — Ambulatory Visit (HOSPITAL_COMMUNITY): Admission: RE | Admit: 2009-04-04 | Discharge: 2009-04-04 | Payer: Self-pay | Admitting: Oncology

## 2009-04-05 ENCOUNTER — Telehealth: Payer: Self-pay | Admitting: Internal Medicine

## 2009-04-07 ENCOUNTER — Encounter: Payer: Self-pay | Admitting: Cardiology

## 2009-04-07 ENCOUNTER — Inpatient Hospital Stay (HOSPITAL_COMMUNITY): Admission: EM | Admit: 2009-04-07 | Discharge: 2009-04-10 | Payer: Self-pay | Admitting: Emergency Medicine

## 2009-04-07 ENCOUNTER — Ambulatory Visit: Payer: Self-pay | Admitting: Oncology

## 2009-04-07 ENCOUNTER — Ambulatory Visit: Payer: Self-pay | Admitting: Internal Medicine

## 2009-04-08 ENCOUNTER — Encounter (INDEPENDENT_AMBULATORY_CARE_PROVIDER_SITE_OTHER): Payer: Self-pay | Admitting: Internal Medicine

## 2009-04-08 ENCOUNTER — Encounter: Payer: Self-pay | Admitting: Cardiology

## 2009-04-08 ENCOUNTER — Ambulatory Visit: Payer: Self-pay | Admitting: Cardiovascular Disease

## 2009-04-09 ENCOUNTER — Ambulatory Visit: Payer: Self-pay | Admitting: Hematology & Oncology

## 2009-04-10 ENCOUNTER — Encounter: Payer: Self-pay | Admitting: Cardiology

## 2009-04-13 ENCOUNTER — Telehealth: Payer: Self-pay | Admitting: Internal Medicine

## 2009-04-13 ENCOUNTER — Encounter: Payer: Self-pay | Admitting: Cardiology

## 2009-04-15 ENCOUNTER — Encounter: Payer: Self-pay | Admitting: Internal Medicine

## 2009-04-15 LAB — CBC WITH DIFFERENTIAL/PLATELET
BASO%: 0.8 % (ref 0.0–2.0)
EOS%: 0.9 % (ref 0.0–7.0)
Eosinophils Absolute: 0.1 10*3/uL (ref 0.0–0.5)
MCHC: 33.2 g/dL (ref 31.5–36.0)
MCV: 88.9 fL (ref 79.5–101.0)
MONO%: 7.7 % (ref 0.0–14.0)
NEUT#: 8.7 10*3/uL — ABNORMAL HIGH (ref 1.5–6.5)
RBC: 3.89 10*6/uL (ref 3.70–5.45)
RDW: 14.3 % (ref 11.2–14.5)
nRBC: 0 % (ref 0–0)

## 2009-04-21 ENCOUNTER — Ambulatory Visit: Payer: Self-pay | Admitting: Internal Medicine

## 2009-04-21 DIAGNOSIS — Z8572 Personal history of non-Hodgkin lymphomas: Secondary | ICD-10-CM | POA: Insufficient documentation

## 2009-04-26 ENCOUNTER — Encounter: Payer: Self-pay | Admitting: Internal Medicine

## 2009-04-26 ENCOUNTER — Ambulatory Visit: Payer: Self-pay | Admitting: Oncology

## 2009-04-26 LAB — CBC WITH DIFFERENTIAL/PLATELET
BASO%: 0.8 % (ref 0.0–2.0)
HCT: 36.6 % (ref 34.8–46.6)
LYMPH%: 17.9 % (ref 14.0–49.7)
MCHC: 33.6 g/dL (ref 31.5–36.0)
MCV: 90.4 fL (ref 79.5–101.0)
MONO%: 6.6 % (ref 0.0–14.0)
NEUT%: 73.6 % (ref 38.4–76.8)
Platelets: 236 10*3/uL (ref 145–400)
RBC: 4.05 10*6/uL (ref 3.70–5.45)

## 2009-04-26 LAB — COMPREHENSIVE METABOLIC PANEL
ALT: 22 U/L (ref 0–35)
AST: 27 U/L (ref 0–37)
Alkaline Phosphatase: 57 U/L (ref 39–117)
CO2: 31 mEq/L (ref 19–32)
Creatinine, Ser: 1.03 mg/dL (ref 0.40–1.20)
Sodium: 138 mEq/L (ref 135–145)
Total Bilirubin: 0.7 mg/dL (ref 0.3–1.2)
Total Protein: 6.1 g/dL (ref 6.0–8.3)

## 2009-05-05 LAB — CBC WITH DIFFERENTIAL/PLATELET
BASO%: 1.3 % (ref 0.0–2.0)
EOS%: 2.1 % (ref 0.0–7.0)
HCT: 31.6 % — ABNORMAL LOW (ref 34.8–46.6)
LYMPH%: 18 % (ref 14.0–49.7)
MCH: 31.7 pg (ref 25.1–34.0)
MCHC: 34.4 g/dL (ref 31.5–36.0)
MONO#: 0.5 10*3/uL (ref 0.1–0.9)
NEUT%: 71.6 % (ref 38.4–76.8)
Platelets: 146 10*3/uL (ref 145–400)
RBC: 3.43 10*6/uL — ABNORMAL LOW (ref 3.70–5.45)
WBC: 7.3 10*3/uL (ref 3.9–10.3)
lymph#: 1.3 10*3/uL (ref 0.9–3.3)

## 2009-05-17 ENCOUNTER — Encounter: Payer: Self-pay | Admitting: Cardiology

## 2009-05-17 ENCOUNTER — Encounter: Payer: Self-pay | Admitting: Internal Medicine

## 2009-05-17 ENCOUNTER — Ambulatory Visit: Payer: Self-pay | Admitting: Oncology

## 2009-05-17 LAB — CBC WITH DIFFERENTIAL/PLATELET
Basophils Absolute: 0 10*3/uL (ref 0.0–0.1)
Eosinophils Absolute: 0.2 10*3/uL (ref 0.0–0.5)
HGB: 12.4 g/dL (ref 11.6–15.9)
MCV: 93.8 fL (ref 79.5–101.0)
MONO#: 0.9 10*3/uL (ref 0.1–0.9)
MONO%: 7.3 % (ref 0.0–14.0)
NEUT#: 9.6 10*3/uL — ABNORMAL HIGH (ref 1.5–6.5)
RDW: 18.4 % — ABNORMAL HIGH (ref 11.2–14.5)

## 2009-05-17 LAB — COMPREHENSIVE METABOLIC PANEL
Albumin: 3.6 g/dL (ref 3.5–5.2)
Alkaline Phosphatase: 81 U/L (ref 39–117)
BUN: 23 mg/dL (ref 6–23)
CO2: 32 mEq/L (ref 19–32)
Calcium: 9.7 mg/dL (ref 8.4–10.5)
Chloride: 98 mEq/L (ref 96–112)
Glucose, Bld: 157 mg/dL — ABNORMAL HIGH (ref 70–99)
Potassium: 4.1 mEq/L (ref 3.5–5.3)

## 2009-05-23 ENCOUNTER — Ambulatory Visit: Payer: Self-pay | Admitting: Internal Medicine

## 2009-05-31 ENCOUNTER — Encounter: Payer: Self-pay | Admitting: Internal Medicine

## 2009-06-07 ENCOUNTER — Encounter: Payer: Self-pay | Admitting: Cardiology

## 2009-06-07 LAB — COMPREHENSIVE METABOLIC PANEL
Albumin: 3.4 g/dL — ABNORMAL LOW (ref 3.5–5.2)
BUN: 23 mg/dL (ref 6–23)
Calcium: 9.2 mg/dL (ref 8.4–10.5)
Chloride: 100 mEq/L (ref 96–112)
Glucose, Bld: 238 mg/dL — ABNORMAL HIGH (ref 70–99)
Potassium: 4.2 mEq/L (ref 3.5–5.3)
Sodium: 139 mEq/L (ref 135–145)
Total Protein: 5.9 g/dL — ABNORMAL LOW (ref 6.0–8.3)

## 2009-06-07 LAB — CBC WITH DIFFERENTIAL/PLATELET
Basophils Absolute: 0 10*3/uL (ref 0.0–0.1)
Eosinophils Absolute: 0.1 10*3/uL (ref 0.0–0.5)
HGB: 11.4 g/dL — ABNORMAL LOW (ref 11.6–15.9)
MONO#: 0.1 10*3/uL (ref 0.1–0.9)
NEUT#: 9.8 10*3/uL — ABNORMAL HIGH (ref 1.5–6.5)
RBC: 3.44 10*6/uL — ABNORMAL LOW (ref 3.70–5.45)
RDW: 18 % — ABNORMAL HIGH (ref 11.2–14.5)
WBC: 11.1 10*3/uL — ABNORMAL HIGH (ref 3.9–10.3)
lymph#: 1.1 10*3/uL (ref 0.9–3.3)

## 2009-06-10 ENCOUNTER — Telehealth (INDEPENDENT_AMBULATORY_CARE_PROVIDER_SITE_OTHER): Payer: Self-pay | Admitting: *Deleted

## 2009-06-10 ENCOUNTER — Encounter: Payer: Self-pay | Admitting: Cardiology

## 2009-06-17 ENCOUNTER — Ambulatory Visit: Payer: Self-pay | Admitting: Internal Medicine

## 2009-06-17 LAB — CONVERTED CEMR LAB: POC INR: 1.1

## 2009-06-20 ENCOUNTER — Ambulatory Visit: Payer: Self-pay | Admitting: Oncology

## 2009-06-21 ENCOUNTER — Ambulatory Visit: Payer: Self-pay | Admitting: Cardiology

## 2009-06-21 DIAGNOSIS — I480 Paroxysmal atrial fibrillation: Secondary | ICD-10-CM

## 2009-06-22 DIAGNOSIS — R0989 Other specified symptoms and signs involving the circulatory and respiratory systems: Secondary | ICD-10-CM

## 2009-06-22 DIAGNOSIS — R0902 Hypoxemia: Secondary | ICD-10-CM | POA: Insufficient documentation

## 2009-06-22 DIAGNOSIS — R0609 Other forms of dyspnea: Secondary | ICD-10-CM

## 2009-06-22 LAB — COMPREHENSIVE METABOLIC PANEL
AST: 19 U/L (ref 0–37)
BUN: 16 mg/dL (ref 6–23)
Calcium: 9.2 mg/dL (ref 8.4–10.5)
Chloride: 101 mEq/L (ref 96–112)
Creatinine, Ser: 1.14 mg/dL (ref 0.40–1.20)
Total Bilirubin: 0.8 mg/dL (ref 0.3–1.2)

## 2009-06-22 LAB — CBC WITH DIFFERENTIAL/PLATELET
BASO%: 0.4 % (ref 0.0–2.0)
Basophils Absolute: 0 10*3/uL (ref 0.0–0.1)
EOS%: 2.4 % (ref 0.0–7.0)
HCT: 32.7 % — ABNORMAL LOW (ref 34.8–46.6)
HGB: 11.3 g/dL — ABNORMAL LOW (ref 11.6–15.9)
LYMPH%: 10.8 % — ABNORMAL LOW (ref 14.0–49.7)
MCH: 33 pg (ref 25.1–34.0)
MCHC: 34.5 g/dL (ref 31.5–36.0)
MCV: 95.8 fL (ref 79.5–101.0)
MONO%: 7.2 % (ref 0.0–14.0)
NEUT%: 79.2 % — ABNORMAL HIGH (ref 38.4–76.8)
Platelets: 140 10*3/uL — ABNORMAL LOW (ref 145–400)
lymph#: 1.1 10*3/uL (ref 0.9–3.3)

## 2009-06-23 ENCOUNTER — Ambulatory Visit (HOSPITAL_COMMUNITY): Admission: RE | Admit: 2009-06-23 | Discharge: 2009-06-23 | Payer: Self-pay | Admitting: Oncology

## 2009-06-24 ENCOUNTER — Ambulatory Visit: Payer: Self-pay | Admitting: Internal Medicine

## 2009-06-24 ENCOUNTER — Encounter: Payer: Self-pay | Admitting: Cardiology

## 2009-06-24 LAB — CONVERTED CEMR LAB: POC INR: 1.8

## 2009-06-28 ENCOUNTER — Encounter: Payer: Self-pay | Admitting: Cardiology

## 2009-06-28 LAB — CBC WITH DIFFERENTIAL/PLATELET
Basophils Absolute: 0.1 10*3/uL (ref 0.0–0.1)
Eosinophils Absolute: 0.1 10*3/uL (ref 0.0–0.5)
HCT: 30.7 % — ABNORMAL LOW (ref 34.8–46.6)
HGB: 10.1 g/dL — ABNORMAL LOW (ref 11.6–15.9)
MCV: 95.6 fL (ref 79.5–101.0)
NEUT#: 8.5 10*3/uL — ABNORMAL HIGH (ref 1.5–6.5)
NEUT%: 84 % — ABNORMAL HIGH (ref 38.4–76.8)
RDW: 17.1 % — ABNORMAL HIGH (ref 11.2–14.5)
lymph#: 0.8 10*3/uL — ABNORMAL LOW (ref 0.9–3.3)

## 2009-06-29 ENCOUNTER — Ambulatory Visit: Payer: Self-pay | Admitting: Internal Medicine

## 2009-07-12 ENCOUNTER — Ambulatory Visit: Payer: Self-pay | Admitting: Cardiovascular Disease

## 2009-07-12 LAB — CONVERTED CEMR LAB: POC INR: 1.4

## 2009-07-15 ENCOUNTER — Encounter: Payer: Self-pay | Admitting: Cardiology

## 2009-07-19 ENCOUNTER — Encounter: Payer: Self-pay | Admitting: Cardiology

## 2009-07-19 LAB — CBC WITH DIFFERENTIAL/PLATELET
BASO%: 0.8 % (ref 0.0–2.0)
Basophils Absolute: 0.1 10*3/uL (ref 0.0–0.1)
EOS%: 1.4 % (ref 0.0–7.0)
HCT: 31.9 % — ABNORMAL LOW (ref 34.8–46.6)
HGB: 10.5 g/dL — ABNORMAL LOW (ref 11.6–15.9)
LYMPH%: 17.6 % (ref 14.0–49.7)
MCH: 31.6 pg (ref 25.1–34.0)
MCHC: 32.9 g/dL (ref 31.5–36.0)
MCV: 96.1 fL (ref 79.5–101.0)
NEUT%: 66.5 % (ref 38.4–76.8)
Platelets: 142 10*3/uL — ABNORMAL LOW (ref 145–400)

## 2009-07-19 LAB — COMPREHENSIVE METABOLIC PANEL
AST: 20 U/L (ref 0–37)
Albumin: 2.5 g/dL — ABNORMAL LOW (ref 3.5–5.2)
Alkaline Phosphatase: 208 U/L — ABNORMAL HIGH (ref 39–117)
BUN: 30 mg/dL — ABNORMAL HIGH (ref 6–23)
Calcium: 8.3 mg/dL — ABNORMAL LOW (ref 8.4–10.5)
Chloride: 104 mEq/L (ref 96–112)
Creatinine, Ser: 1.7 mg/dL — ABNORMAL HIGH (ref 0.40–1.20)
Glucose, Bld: 438 mg/dL — ABNORMAL HIGH (ref 70–99)

## 2009-07-22 ENCOUNTER — Ambulatory Visit: Payer: Self-pay | Admitting: Cardiology

## 2009-07-25 ENCOUNTER — Ambulatory Visit: Payer: Self-pay | Admitting: Oncology

## 2009-07-25 LAB — BASIC METABOLIC PANEL
BUN: 32 mg/dL — ABNORMAL HIGH (ref 6–23)
Chloride: 103 mEq/L (ref 96–112)
Potassium: 4.3 mEq/L (ref 3.5–5.3)
Sodium: 140 mEq/L (ref 135–145)

## 2009-08-05 ENCOUNTER — Ambulatory Visit: Payer: Self-pay | Admitting: Internal Medicine

## 2009-08-09 LAB — CBC WITH DIFFERENTIAL/PLATELET
Basophils Absolute: 0 10*3/uL (ref 0.0–0.1)
Eosinophils Absolute: 0.1 10*3/uL (ref 0.0–0.5)
HCT: 35.8 % (ref 34.8–46.6)
LYMPH%: 34.5 % (ref 14.0–49.7)
MCV: 96.2 fL (ref 79.5–101.0)
MONO#: 1.2 10*3/uL — ABNORMAL HIGH (ref 0.1–0.9)
MONO%: 28.6 % — ABNORMAL HIGH (ref 0.0–14.0)
NEUT#: 1.4 10*3/uL — ABNORMAL LOW (ref 1.5–6.5)
NEUT%: 33.6 % — ABNORMAL LOW (ref 38.4–76.8)
Platelets: 192 10*3/uL (ref 145–400)
RBC: 3.72 10*6/uL (ref 3.70–5.45)
WBC: 4.2 10*3/uL (ref 3.9–10.3)

## 2009-08-12 ENCOUNTER — Ambulatory Visit: Payer: Self-pay | Admitting: Internal Medicine

## 2009-08-17 ENCOUNTER — Telehealth: Payer: Self-pay | Admitting: Internal Medicine

## 2009-08-17 ENCOUNTER — Encounter: Payer: Self-pay | Admitting: Family

## 2009-08-22 ENCOUNTER — Telehealth: Payer: Self-pay | Admitting: Internal Medicine

## 2009-08-25 ENCOUNTER — Encounter: Payer: Self-pay | Admitting: Cardiology

## 2009-08-26 ENCOUNTER — Ambulatory Visit: Payer: Self-pay | Admitting: Oncology

## 2009-08-30 ENCOUNTER — Telehealth: Payer: Self-pay | Admitting: Internal Medicine

## 2009-08-30 ENCOUNTER — Encounter: Payer: Self-pay | Admitting: Cardiology

## 2009-08-30 LAB — COMPREHENSIVE METABOLIC PANEL
ALT: 13 U/L (ref 0–35)
AST: 16 U/L (ref 0–37)
Alkaline Phosphatase: 65 U/L (ref 39–117)
CO2: 26 mEq/L (ref 19–32)
Sodium: 139 mEq/L (ref 135–145)
Total Bilirubin: 0.5 mg/dL (ref 0.3–1.2)
Total Protein: 5.9 g/dL — ABNORMAL LOW (ref 6.0–8.3)

## 2009-08-30 LAB — CBC WITH DIFFERENTIAL/PLATELET
BASO%: 0.3 % (ref 0.0–2.0)
EOS%: 4.1 % (ref 0.0–7.0)
LYMPH%: 15.2 % (ref 14.0–49.7)
MCH: 31.6 pg (ref 25.1–34.0)
MCHC: 33.8 g/dL (ref 31.5–36.0)
MONO#: 0.8 10*3/uL (ref 0.1–0.9)
MONO%: 11.5 % (ref 0.0–14.0)
Platelets: 173 10*3/uL (ref 145–400)
RBC: 3.92 10*6/uL (ref 3.70–5.45)
WBC: 7.2 10*3/uL (ref 3.9–10.3)

## 2009-08-31 ENCOUNTER — Ambulatory Visit: Payer: Self-pay | Admitting: Cardiology

## 2009-09-06 ENCOUNTER — Ambulatory Visit: Payer: Self-pay | Admitting: Internal Medicine

## 2009-09-06 LAB — CONVERTED CEMR LAB
BUN: 21 mg/dL (ref 6–23)
CO2: 31 meq/L (ref 19–32)
Calcium: 8.9 mg/dL (ref 8.4–10.5)
Chloride: 103 meq/L (ref 96–112)
Creatinine, Ser: 0.9 mg/dL (ref 0.4–1.2)
Glucose, Bld: 143 mg/dL — ABNORMAL HIGH (ref 70–99)
Microalb, Ur: 91.7 mg/dL — ABNORMAL HIGH (ref 0.0–1.9)

## 2009-09-08 ENCOUNTER — Ambulatory Visit (HOSPITAL_COMMUNITY): Admission: RE | Admit: 2009-09-08 | Discharge: 2009-09-08 | Payer: Self-pay | Admitting: Oncology

## 2009-09-13 ENCOUNTER — Ambulatory Visit: Payer: Self-pay | Admitting: Internal Medicine

## 2009-09-13 LAB — CONVERTED CEMR LAB

## 2009-09-16 ENCOUNTER — Ambulatory Visit: Payer: Self-pay | Admitting: Internal Medicine

## 2009-09-16 LAB — CONVERTED CEMR LAB: POC INR: 1.1

## 2009-09-23 ENCOUNTER — Ambulatory Visit: Payer: Self-pay | Admitting: Cardiology

## 2009-09-23 LAB — CONVERTED CEMR LAB: POC INR: 1.3

## 2009-09-28 ENCOUNTER — Inpatient Hospital Stay (HOSPITAL_COMMUNITY): Admission: AD | Admit: 2009-09-28 | Discharge: 2009-09-30 | Payer: Self-pay | Admitting: Internal Medicine

## 2009-09-28 ENCOUNTER — Ambulatory Visit: Payer: Self-pay | Admitting: Internal Medicine

## 2009-09-28 ENCOUNTER — Ambulatory Visit: Payer: Self-pay | Admitting: Diagnostic Radiology

## 2009-09-28 ENCOUNTER — Encounter: Payer: Self-pay | Admitting: Emergency Medicine

## 2009-09-29 ENCOUNTER — Ambulatory Visit: Payer: Self-pay | Admitting: Surgery

## 2009-09-29 ENCOUNTER — Encounter (INDEPENDENT_AMBULATORY_CARE_PROVIDER_SITE_OTHER): Payer: Self-pay | Admitting: Internal Medicine

## 2009-10-03 ENCOUNTER — Ambulatory Visit: Payer: Self-pay | Admitting: Cardiology

## 2009-10-11 ENCOUNTER — Telehealth: Payer: Self-pay | Admitting: Internal Medicine

## 2009-10-12 ENCOUNTER — Ambulatory Visit: Payer: Self-pay | Admitting: Internal Medicine

## 2009-10-12 LAB — CONVERTED CEMR LAB
BUN: 28 mg/dL — ABNORMAL HIGH (ref 6–23)
Basophils Absolute: 0 10*3/uL (ref 0.0–0.1)
Chloride: 98 meq/L (ref 96–112)
Eosinophils Relative: 4.2 % (ref 0.0–5.0)
GFR calc non Af Amer: 54.35 mL/min (ref >60)
Glucose, Bld: 285 mg/dL — ABNORMAL HIGH (ref 70–99)
HCT: 35.5 % — ABNORMAL LOW (ref 36.0–46.0)
Hemoglobin: 12.1 g/dL (ref 12.0–15.0)
Lymphs Abs: 1 10*3/uL (ref 0.7–4.0)
MCV: 86.6 fL (ref 78.0–100.0)
Monocytes Absolute: 0.7 10*3/uL (ref 0.1–1.0)
Monocytes Relative: 11.4 % (ref 3.0–12.0)
Neutro Abs: 3.9 10*3/uL (ref 1.4–7.7)
Platelets: 198 10*3/uL (ref 150.0–400.0)
Potassium: 4.5 meq/L (ref 3.5–5.1)
RDW: 15.1 % — ABNORMAL HIGH (ref 11.5–14.6)
Sodium: 139 meq/L (ref 135–145)

## 2009-10-17 ENCOUNTER — Ambulatory Visit: Payer: Self-pay | Admitting: Cardiology

## 2009-10-17 ENCOUNTER — Ambulatory Visit: Payer: Self-pay | Admitting: Internal Medicine

## 2009-10-24 ENCOUNTER — Telehealth: Payer: Self-pay | Admitting: Internal Medicine

## 2009-10-28 ENCOUNTER — Ambulatory Visit: Payer: Self-pay | Admitting: Cardiology

## 2009-10-28 DIAGNOSIS — E11319 Type 2 diabetes mellitus with unspecified diabetic retinopathy without macular edema: Secondary | ICD-10-CM | POA: Insufficient documentation

## 2009-11-01 ENCOUNTER — Encounter: Payer: Self-pay | Admitting: Internal Medicine

## 2009-11-07 ENCOUNTER — Inpatient Hospital Stay (HOSPITAL_COMMUNITY): Admission: EM | Admit: 2009-11-07 | Discharge: 2009-11-09 | Payer: Self-pay | Admitting: Emergency Medicine

## 2009-11-08 ENCOUNTER — Ambulatory Visit: Payer: Self-pay | Admitting: Internal Medicine

## 2009-11-09 ENCOUNTER — Telehealth: Payer: Self-pay | Admitting: Internal Medicine

## 2009-11-11 ENCOUNTER — Encounter: Payer: Self-pay | Admitting: Internal Medicine

## 2009-11-15 ENCOUNTER — Ambulatory Visit: Payer: Self-pay | Admitting: Internal Medicine

## 2009-11-15 LAB — CONVERTED CEMR LAB: POC INR: 2.5

## 2009-11-25 ENCOUNTER — Ambulatory Visit: Payer: Self-pay | Admitting: Oncology

## 2009-11-29 ENCOUNTER — Ambulatory Visit: Payer: Self-pay

## 2009-11-29 ENCOUNTER — Ambulatory Visit: Payer: Self-pay | Admitting: Internal Medicine

## 2009-11-29 ENCOUNTER — Encounter: Payer: Self-pay | Admitting: Internal Medicine

## 2009-11-29 LAB — CONVERTED CEMR LAB: POC INR: 2.3

## 2009-11-30 ENCOUNTER — Telehealth: Payer: Self-pay | Admitting: Internal Medicine

## 2009-12-05 ENCOUNTER — Telehealth (INDEPENDENT_AMBULATORY_CARE_PROVIDER_SITE_OTHER): Payer: Self-pay | Admitting: *Deleted

## 2009-12-05 ENCOUNTER — Telehealth: Payer: Self-pay | Admitting: Internal Medicine

## 2009-12-13 ENCOUNTER — Encounter: Payer: Self-pay | Admitting: Internal Medicine

## 2009-12-13 LAB — HM DIABETES EYE EXAM: HM Diabetic Eye Exam: NORMAL

## 2009-12-20 ENCOUNTER — Ambulatory Visit: Payer: Self-pay | Admitting: Cardiology

## 2009-12-20 ENCOUNTER — Inpatient Hospital Stay (HOSPITAL_COMMUNITY): Admission: EM | Admit: 2009-12-20 | Discharge: 2009-12-23 | Payer: Self-pay | Admitting: Emergency Medicine

## 2009-12-20 ENCOUNTER — Telehealth (INDEPENDENT_AMBULATORY_CARE_PROVIDER_SITE_OTHER): Payer: Self-pay | Admitting: *Deleted

## 2009-12-20 ENCOUNTER — Encounter (INDEPENDENT_AMBULATORY_CARE_PROVIDER_SITE_OTHER): Payer: Self-pay | Admitting: Internal Medicine

## 2009-12-20 ENCOUNTER — Encounter: Payer: Self-pay | Admitting: Cardiology

## 2009-12-23 ENCOUNTER — Encounter: Payer: Self-pay | Admitting: Cardiology

## 2009-12-27 ENCOUNTER — Ambulatory Visit: Payer: Self-pay | Admitting: Cardiology

## 2009-12-27 ENCOUNTER — Telehealth: Payer: Self-pay | Admitting: Internal Medicine

## 2009-12-27 ENCOUNTER — Telehealth (INDEPENDENT_AMBULATORY_CARE_PROVIDER_SITE_OTHER): Payer: Self-pay | Admitting: *Deleted

## 2009-12-28 ENCOUNTER — Ambulatory Visit: Payer: Self-pay | Admitting: Internal Medicine

## 2009-12-28 DIAGNOSIS — M79609 Pain in unspecified limb: Secondary | ICD-10-CM

## 2009-12-28 LAB — CONVERTED CEMR LAB
Chloride: 98 meq/L (ref 96–112)
Magnesium: 2.1 mg/dL (ref 1.5–2.5)
Potassium: 5.3 meq/L (ref 3.5–5.3)
Sodium: 136 meq/L (ref 135–145)
Total CK: 86 units/L (ref 7–177)

## 2009-12-29 ENCOUNTER — Telehealth: Payer: Self-pay | Admitting: Internal Medicine

## 2010-01-06 ENCOUNTER — Ambulatory Visit: Payer: Self-pay | Admitting: Oncology

## 2010-01-06 ENCOUNTER — Telehealth: Payer: Self-pay | Admitting: Internal Medicine

## 2010-01-06 ENCOUNTER — Emergency Department (HOSPITAL_COMMUNITY): Admission: EM | Admit: 2010-01-06 | Discharge: 2010-01-06 | Payer: Self-pay | Admitting: Family Medicine

## 2010-01-17 ENCOUNTER — Ambulatory Visit: Payer: Self-pay | Admitting: Internal Medicine

## 2010-01-19 ENCOUNTER — Ambulatory Visit: Payer: Self-pay | Admitting: Internal Medicine

## 2010-01-23 ENCOUNTER — Encounter: Payer: Self-pay | Admitting: Internal Medicine

## 2010-01-27 ENCOUNTER — Ambulatory Visit: Payer: Self-pay | Admitting: Cardiology

## 2010-01-27 LAB — CONVERTED CEMR LAB: POC INR: 2.4

## 2010-01-30 ENCOUNTER — Encounter: Payer: Self-pay | Admitting: Internal Medicine

## 2010-02-10 ENCOUNTER — Encounter: Payer: Self-pay | Admitting: Internal Medicine

## 2010-02-24 ENCOUNTER — Ambulatory Visit: Payer: Self-pay | Admitting: Oncology

## 2010-02-24 ENCOUNTER — Ambulatory Visit: Payer: Self-pay | Admitting: Cardiology

## 2010-02-24 LAB — CONVERTED CEMR LAB: POC INR: 2.9

## 2010-02-28 ENCOUNTER — Encounter: Payer: Self-pay | Admitting: Cardiology

## 2010-03-01 ENCOUNTER — Telehealth: Payer: Self-pay | Admitting: Internal Medicine

## 2010-03-15 ENCOUNTER — Ambulatory Visit: Payer: Self-pay | Admitting: Internal Medicine

## 2010-03-15 LAB — CONVERTED CEMR LAB
Bilirubin, Direct: 0.1 mg/dL (ref 0.0–0.3)
Calcium: 9.2 mg/dL (ref 8.4–10.5)
Creatinine, Ser: 1.1 mg/dL (ref 0.4–1.2)
GFR calc non Af Amer: 54.88 mL/min (ref >60)
HDL: 41.4 mg/dL (ref >39.00)
Sodium: 142 meq/L (ref 135–145)
TSH: 4.15 microintl units/mL (ref 0.35–5.50)
Total Bilirubin: 0.8 mg/dL (ref 0.3–1.2)
Total CHOL/HDL Ratio: 5
Triglycerides: 418 mg/dL — ABNORMAL HIGH (ref 0.0–149.0)
VLDL: 83.6 mg/dL — ABNORMAL HIGH (ref 0.0–40.0)

## 2010-03-21 ENCOUNTER — Ambulatory Visit: Payer: Self-pay | Admitting: Internal Medicine

## 2010-03-24 ENCOUNTER — Ambulatory Visit: Payer: Self-pay | Admitting: Cardiology

## 2010-04-06 ENCOUNTER — Encounter: Payer: Self-pay | Admitting: Internal Medicine

## 2010-04-12 ENCOUNTER — Telehealth: Payer: Self-pay | Admitting: Internal Medicine

## 2010-04-21 ENCOUNTER — Ambulatory Visit: Payer: Self-pay | Admitting: Internal Medicine

## 2010-04-21 LAB — CONVERTED CEMR LAB: POC INR: 2.8

## 2010-04-25 ENCOUNTER — Telehealth: Payer: Self-pay | Admitting: Internal Medicine

## 2010-04-27 ENCOUNTER — Ambulatory Visit: Payer: Self-pay | Admitting: Internal Medicine

## 2010-05-16 ENCOUNTER — Telehealth: Payer: Self-pay | Admitting: Internal Medicine

## 2010-05-16 ENCOUNTER — Encounter (INDEPENDENT_AMBULATORY_CARE_PROVIDER_SITE_OTHER): Payer: Self-pay | Admitting: *Deleted

## 2010-05-16 ENCOUNTER — Ambulatory Visit
Admission: RE | Admit: 2010-05-16 | Discharge: 2010-05-16 | Payer: Self-pay | Source: Home / Self Care | Attending: Internal Medicine | Admitting: Internal Medicine

## 2010-05-16 LAB — CONVERTED CEMR LAB
BUN: 24 mg/dL — ABNORMAL HIGH (ref 6–23)
CO2: 28 meq/L (ref 19–32)
Chloride: 99 meq/L (ref 96–112)
Creatinine, Ser: 1 mg/dL (ref 0.4–1.2)
TSH: 2.8 microintl units/mL (ref 0.35–5.50)

## 2010-05-18 ENCOUNTER — Ambulatory Visit
Admission: RE | Admit: 2010-05-18 | Discharge: 2010-05-18 | Payer: Self-pay | Source: Home / Self Care | Attending: Cardiology | Admitting: Cardiology

## 2010-05-18 ENCOUNTER — Encounter: Payer: Self-pay | Admitting: Cardiology

## 2010-05-19 ENCOUNTER — Ambulatory Visit: Admission: RE | Admit: 2010-05-19 | Discharge: 2010-05-19 | Payer: Self-pay | Source: Home / Self Care

## 2010-05-19 ENCOUNTER — Ambulatory Visit: Admit: 2010-05-19 | Payer: Self-pay | Admitting: Internal Medicine

## 2010-05-19 LAB — CONVERTED CEMR LAB: POC INR: 2.8

## 2010-05-22 ENCOUNTER — Ambulatory Visit
Admission: RE | Admit: 2010-05-22 | Discharge: 2010-05-22 | Payer: Self-pay | Source: Home / Self Care | Attending: Cardiology | Admitting: Cardiology

## 2010-05-22 ENCOUNTER — Ambulatory Visit
Admission: RE | Admit: 2010-05-22 | Discharge: 2010-05-22 | Payer: Self-pay | Source: Home / Self Care | Attending: Internal Medicine | Admitting: Internal Medicine

## 2010-05-25 ENCOUNTER — Encounter (INDEPENDENT_AMBULATORY_CARE_PROVIDER_SITE_OTHER): Payer: Self-pay | Admitting: *Deleted

## 2010-05-26 ENCOUNTER — Ambulatory Visit: Payer: Self-pay | Admitting: Oncology

## 2010-05-30 ENCOUNTER — Encounter: Payer: Self-pay | Admitting: Cardiology

## 2010-05-30 ENCOUNTER — Encounter: Payer: Self-pay | Admitting: Internal Medicine

## 2010-05-30 LAB — CBC WITH DIFFERENTIAL/PLATELET
BASO%: 0.6 % (ref 0.0–2.0)
Basophils Absolute: 0 10*3/uL (ref 0.0–0.1)
EOS%: 2.4 % (ref 0.0–7.0)
Eosinophils Absolute: 0.2 10*3/uL (ref 0.0–0.5)
HCT: 37.7 % (ref 34.8–46.6)
HGB: 13 g/dL (ref 11.6–15.9)
LYMPH%: 16 % (ref 14.0–49.7)
MCH: 30.4 pg (ref 25.1–34.0)
MCHC: 34.4 g/dL (ref 31.5–36.0)
MCV: 88.5 fL (ref 79.5–101.0)
MONO#: 0.5 10*3/uL (ref 0.1–0.9)
MONO%: 6.4 % (ref 0.0–14.0)
NEUT#: 5.4 10*3/uL (ref 1.5–6.5)
NEUT%: 74.6 % (ref 38.4–76.8)
Platelets: 168 10*3/uL (ref 145–400)
RBC: 4.26 10*6/uL (ref 3.70–5.45)
RDW: 14.3 % (ref 11.2–14.5)
WBC: 7.2 10*3/uL (ref 3.9–10.3)
lymph#: 1.2 10*3/uL (ref 0.9–3.3)

## 2010-05-30 LAB — LACTATE DEHYDROGENASE: LDH: 221 U/L (ref 94–250)

## 2010-06-01 ENCOUNTER — Encounter: Payer: Self-pay | Admitting: Internal Medicine

## 2010-06-01 ENCOUNTER — Ambulatory Visit
Admission: RE | Admit: 2010-06-01 | Discharge: 2010-06-01 | Payer: Self-pay | Source: Home / Self Care | Attending: Internal Medicine | Admitting: Internal Medicine

## 2010-06-03 ENCOUNTER — Encounter: Payer: Self-pay | Admitting: Physical Medicine and Rehabilitation

## 2010-06-04 ENCOUNTER — Encounter: Payer: Self-pay | Admitting: Physical Medicine and Rehabilitation

## 2010-06-05 ENCOUNTER — Telehealth: Payer: Self-pay | Admitting: Internal Medicine

## 2010-06-11 LAB — CONVERTED CEMR LAB

## 2010-06-13 NOTE — Letter (Signed)
Summary: Remote Device Check  [employer], Main Office  [address]   [place], [place] [address]   Phone: [phone]  Fax: [phone]     May 31, 2009 MRN: [id number]   [facility] [address] [place], [place]  [address]   Dear Ms. Jobst,   Your remote transmission was recieved and reviewed by your physician.  All diagnostics were within normal limits for you.    ___X___Your next office visit is scheduled for:  APRIL 2011 WITH DR Ladona Ridgel. Please call our office to schedule an appointment.    Sincerely,  Proofreader

## 2010-06-13 NOTE — Medication Information (Signed)
Summary: rov/sp  Anticoagulant Therapy  Managed by: Bethena Midget, RN, BSN Referring MD: Lewayne Bunting PCP: Dondra Spry DO Supervising MD: Tenny Craw MD, Gunnar Fusi Indication 1: Left Ventricular Dysfunction (ICD-LVD) Indication 2: Implant date 08/02/05--global 90 days (ICD-**) Lab Used: [facility] Site: [facility] INR POC 1.1 INR RANGE 2 - 3  Dietary changes: no    Health status changes: no    Bleeding/hemorrhagic complications: no    Recent/future hospitalizations: no    Any changes in medication regimen? no    Recent/future dental: no  Any missed doses?: no       Is patient compliant with meds? yes      Comments: Was off for 5 days prior to [facility] remorval on 09/08/09. Restarted on 09/09/09 with normal dose.   Allergies: 1)  ! Sulfa  Anticoagulation Management History:      The patient is taking warfarin and comes in today for a routine follow up visit.  Positive risk factors for bleeding include an age of 68 years or older, history of CVA/TIA, and presence of serious comorbidities.  The bleeding index is 'high risk'.  Positive CHADS2 values include History of CHF, History of HTN, History of Diabetes, and Prior Stroke/CVA/TIA.  Negative CHADS2 values include Age > 68 years old.  The start date was 09/20/2001.  Anticoagulation responsible provider: Tenny Craw MD, Gunnar Fusi.  INR POC: 1.1.  Cuvette Lot#: [id number].  Exp: 10/2010.    Anticoagulation Management Assessment/Plan:      The patient's current anticoagulation dose is Warfarin sodium 5 mg tabs: take as directed per coumadin clinic.  The target INR is 2.0-3.0.  The next INR is due 09/23/2009.  Anticoagulation instructions were given to patient.  Results were reviewed/authorized by Bethena Midget, RN, BSN.  She was notified by Bethena Midget, RN, BSN.         Prior Anticoagulation Instructions: INR 1.2   Take an extra 1/2 tablet today, 2 tablets tomorrow then resume same dose of 1 1/2 tablets every day except 1 tablet on Sunday and  Thursday.   Current Anticoagulation Instructions: INR 1.1 Today take extra 2.5mg s and on Saturday take 10mg s then resume 7.5mg s everyday except 5mg s on Sundays and Thursdays. Recheck in one week.  Prescriptions: WARFARIN SODIUM 5 MG TABS (WARFARIN SODIUM) take as directed per coumadin clinic  #45 x 3   Entered by:   Bethena Midget, RN, BSN   Authorized by:   Lewayne Bunting, MD, [facility]   Signed by:   Bethena Midget, RN, BSN on 09/16/2009   Method used:   Print then Give to Patient   RxID:   [id number]

## 2010-06-13 NOTE — Letter (Signed)
Summary: External Correspondence/ [facility]  External Correspondence/ [facility]   Imported By: Dorise Hiss 06/17/2009 09:52:18  _____________________________________________________________________  External Attachment:    Type:   Image     Comment:   External Document

## 2010-06-13 NOTE — Medication Information (Signed)
Summary: rov/ewj  Anticoagulant Therapy  Managed by: Cloyde Reams, RN, BSN Referring MD: Lewayne Bunting PCP: Dondra Spry DO Supervising MD: Tenny Craw MD, Gunnar Fusi Indication 1: Left Ventricular Dysfunction (ICD-LVD) Indication 2: Implant date 08/02/05--global 90 days (ICD-**) Lab Used: [facility] Site: [facility] INR POC 1.8 INR RANGE 2 - 3  Dietary changes: no    Health status changes: no    Bleeding/hemorrhagic complications: no    Recent/future hospitalizations: no    Any changes in medication regimen? no    Recent/future dental: no  Any missed doses?: no       Is patient compliant with meds? yes       Allergies (verified): 1)  ! Sulfa  Anticoagulation Management History:      The patient is taking warfarin and comes in today for a routine follow up visit.  Positive risk factors for bleeding include an age of 70 years or older, history of CVA/TIA, and presence of serious comorbidities.  The bleeding index is 'high risk'.  Positive CHADS2 values include History of CHF, History of HTN, History of Diabetes, and Prior Stroke/CVA/TIA.  Negative CHADS2 values include Age > 68 years old.  The start date was 09/20/2001.  Anticoagulation responsible provider: Tenny Craw MD, Gunnar Fusi.  INR POC: 1.8.  Cuvette Lot#: [id number].  Exp: 08/2010.    Anticoagulation Management Assessment/Plan:      The patient's current anticoagulation dose is Warfarin sodium 5 mg tabs: take as directed per coumadin clinic.  The target INR is 2.0-3.0.  The next INR is due 07/04/2009.  Anticoagulation instructions were given to patient.  Results were reviewed/authorized by Cloyde Reams, RN, BSN.  She was notified by Cloyde Reams RN.         Prior Anticoagulation Instructions: INR 1.1  Take 7.5mg  on this Saturday and Sunday then resume same dosage 5mg  daily except 7.5mg  on Mondays, Wednesdays, and Friday.  Recheck in 1 week.    Current Anticoagulation Instructions: INR 1.8  Start taking 1.5 tablets daily except 1  tablet on Sundays, Tuesdays, and Thursdays.  Recheck in 10 days.

## 2010-06-13 NOTE — Miscellaneous (Signed)
Summary: Care Plan/[facility]  Care Plan/[facility]   Imported By: Lanelle Bal 01/26/2010 12:18:22  _____________________________________________________________________  External Attachment:    Type:   Image     Comment:   External Document

## 2010-06-13 NOTE — Medication Information (Signed)
Summary: rov/eac      Allergies Added:  Anticoagulant Therapy  Managed by: Loma Newton, PharmD Referring MD: Lewayne Bunting PCP: Dondra Spry DO Supervising MD: Antoine Poche MD, Fayrene Fearing Indication 1: Left Ventricular Dysfunction (ICD-LVD) Indication 2: Implant date 08/02/05--global 90 days (ICD-**) Lab Used: [facility] Site: [facility] INR POC 2.5 INR RANGE 2 - 3  Dietary changes: no    Health status changes: yes       Details: Hospitalized two weeks ago for fluid overload  Bleeding/hemorrhagic complications: no    Recent/future hospitalizations: no    Any changes in medication regimen? no    Recent/future dental: no  Any missed doses?: no       Is patient compliant with meds? yes       Current Medications (verified): 1)  Crestor 10 Mg  Tabs (Rosuvastatin Calcium) .... By Mouth Once Daily 2)  Enalapril Maleate 10 Mg  Tabs (Enalapril Maleate) .... By Mouth Once Daily 3)  Citalopram Hydrobromide 20 Mg  Tabs (Citalopram Hydrobromide) .... By Mouth Once Daily 4)  Furosemide 80 Mg Tabs (Furosemide) .... Take 1 Tablet By Mouth Once A Day 5)  Coreg 25 Mg  Tabs (Carvedilol) .... 2 By Mouth Two Times A Day 6)  Amlodipine Besylate 5 Mg Tabs (Amlodipine Besylate) .... One By Mouth Once Daily 7)  Humulin R 100 Unit/ml Soln (Insulin Regular Human) .Marland KitchenMarland KitchenMarland Kitchen 15-25 Units Before Meals 8)  Novolin N 100 Unit/ml Susp (Insulin Isophane Human) .... Inject 40-50 Units in Am and 20-30 Units At Night 9)  Low-Dose Aspirin 81 Mg  Tabs (Aspirin) .... One By Mouth Once Daily 10)  Bd Insulin Syringe Ultrafine 30g X 1/2" 1 Ml Misc (Insulin Syringe-Needle U-100) .... Use For Insulin Injection Four Times A Day  (250.02 Dx Code) 11)  Fish Oil 1200 Mg Caps (Omega-3 Fatty Acids) .... Take 2 Capsules By Mouth Two Times A Day 12)  Vitamin D3 1000 Unit Caps (Cholecalciferol) .... Take 1 Capsule By Mouth Once A Day 13)  Multivitamins  Tabs (Multiple Vitamin) .... Take 1 Tablet By Mouth Once A Day 14)  Lorazepam  0.5 Mg Tabs (Lorazepam) .... Take 1 Tab By Mouth At Bedtime 15)  Warfarin Sodium 5 Mg Tabs (Warfarin Sodium) .... 7.5 Mg By Mouth 6 Days A Week and 5mg  By Mouth One Day Per Week 16)  Oxygen 3.5 L By [place] .... Pt Needs Home Oxygen and Portable System. Conserving Device Is Okay. Used At Bedtime 17)  Calcium-Vitamin D 600-200 Mg-Unit Tabs (Calcium-Vitamin D) .... Take 1 Tablet By Mouth Once A Day 18)  Gabapentin 100 Mg Caps (Gabapentin) .... One By Mouth Bid  Allergies (verified): 1)  ! Sulfa  Anticoagulation Management History:      The patient is taking warfarin and comes in today for a routine follow up visit.  Positive risk factors for bleeding include an age of 74 years or older, history of CVA/TIA, and presence of serious comorbidities.  The bleeding index is 'high risk'.  Positive CHADS2 values include History of CHF, History of HTN, History of Diabetes, and Prior Stroke/CVA/TIA.  Negative CHADS2 values include Age > 21 years old.  The start date was 09/20/2001.  Today's INR is 2.5.  Anticoagulation responsible provider: Antoine Poche MD, Fayrene Fearing.  INR POC: 2.5.  Cuvette Lot#: [id number].  Exp: 12/2010.    Anticoagulation Management Assessment/Plan:      The patient's current anticoagulation dose is Warfarin sodium 5 mg tabs: 7.5 mg by mouth 6 days a week  and 5mg  by mouth one day per week.  The target INR is 2.0-3.0.  The next INR is due 11/15/2009.  Anticoagulation instructions were given to patient.  Results were reviewed/authorized by Loma Newton, PharmD.  She was notified by Loma Newton.         Prior Anticoagulation Instructions: INR 2.4  Continue taking 7.5 mg every day, except take 5 mg on Thursday.  Return to clinic in 2 weeks.   Current Anticoagulation Instructions: INR = 2.5 (goal = 2-3)  The patient is to continue with the same dose of coumadin.  This dosage includes: Take 1 and 1/2 tablets on all days except for thursday take 1 tablet.

## 2010-06-13 NOTE — Progress Notes (Signed)
Summary: Lorazepam Refill  Phone Note Refill Request Message from:  Fax from Pharmacy on Oct 11, 2009 3:07 PM  Refills Requested: Medication #1:  LORAZEPAM 0.5 MG TABS Take 1 tab by mouth at bedtime   Dosage confirmed as above?Dosage Confirmed   Brand Name Necessary? No   Supply Requested: 1 month   Last Refilled: 08/11/2009  Method Requested: Telephone to Pharmacy Next Appointment Scheduled: 10/17/2009 @ 10:45 am Dr Artist Pais Initial call taken by: Glendell Docker CMA,  Oct 11, 2009 3:08 PM  Follow-up for Phone Call        ok to refill x 3 Follow-up by: [name],  October 12, 2009 12:10 PM  Additional Follow-up for Phone Call Additional follow up Details #1::        Left message on [facility] voicemail [phone] authorizing refills per Dr. Artist Pais.  Mervin Kung CMA  October 12, 2009 3:36 PM

## 2010-06-13 NOTE — Progress Notes (Signed)
Summary: stop coumadin   Phone Note From Other Clinic   Caller: Nurse tina Summary of Call: Per tina.pt needs to hold coumdain 5 days starting on 4/22 for a port a cath removal. is pt ok to do this?? [phone]  Initial call taken by: Edman Circle,  August 30, 2009 4:43 PM  Follow-up for Phone Call        Yes ,no problem Follow-up by: Lewayne Bunting, MD, [facility],  September 01, 2009 1:39 PM  Additional Follow-up for Phone Call Additional follow up Details #1::        Spoke with patient.  She is aware to start holding Coumadin tomorrow.  Spoke with RN at [facility].  They are aware that she has the okay to hold Coumadin.  They will do the procedure next week on 4/27.  Pt has f/u INR scheduled for 5/6 Additional Follow-up by: Weston Brass PharmD,  September 01, 2009 2:34 PM

## 2010-06-13 NOTE — Letter (Signed)
Summary: Letter/ [facility]  Letter/ [facility]   Imported By: Dorise Hiss 08/30/2009 09:29:09  _____________________________________________________________________  External Attachment:    Type:   Image     Comment:   External Document

## 2010-06-13 NOTE — Cardiovascular Report (Signed)
Summary: Office Visit   Office Visit   Imported By: Roderic Ovens 08/19/2009 14:12:49  _____________________________________________________________________  External Attachment:    Type:   Image     Comment:   External Document

## 2010-06-13 NOTE — Letter (Signed)
Summary: [facility]  [facility]   Imported By: Lanelle Bal 06/01/2009 11:31:54  _____________________________________________________________________  External Attachment:    Type:   Image     Comment:   External Document

## 2010-06-13 NOTE — Medication Information (Signed)
Summary: rov/tm  Anticoagulant Therapy  Managed by: Weston Brass, PharmD Referring MD: Lewayne Bunting PCP: Dondra Spry DO Supervising MD: Antoine Poche MD, Fayrene Fearing Indication 1: Left Ventricular Dysfunction (ICD-LVD) Indication 2: Implant date 08/02/05--global 90 days (ICD-**) Lab Used: [facility] Site: [facility] INR POC 1.3 INR RANGE 2 - 3  Dietary changes: no    Health status changes: no    Bleeding/hemorrhagic complications: no    Recent/future hospitalizations: no    Any changes in medication regimen? no    Recent/future dental: no  Any missed doses?: no       Is patient compliant with meds? yes       Allergies: 1)  ! Sulfa  Anticoagulation Management History:      The patient is taking warfarin and comes in today for a routine follow up visit.  Positive risk factors for bleeding include an age of 68 years or older, history of CVA/TIA, and presence of serious comorbidities.  The bleeding index is 'high risk'.  Positive CHADS2 values include History of CHF, History of HTN, History of Diabetes, and Prior Stroke/CVA/TIA.  Negative CHADS2 values include Age > 4 years old.  The start date was 09/20/2001.  Anticoagulation responsible provider: Antoine Poche MD, Fayrene Fearing.  INR POC: 1.3.  Cuvette Lot#: [id number].  Exp: 12/2010.    Anticoagulation Management Assessment/Plan:      The patient's current anticoagulation dose is Warfarin sodium 5 mg tabs: take as directed per coumadin clinic.  The target INR is 2.0-3.0.  The next INR is due 09/30/2009.  Anticoagulation instructions were given to patient.  Results were reviewed/authorized by Weston Brass, PharmD.  She was notified by [facility].         Prior Anticoagulation Instructions: INR 1.1 Today take extra 2.5mg s and on Saturday take 10mg s then resume 7.5mg s everyday except 5mg s on Sundays and Thursdays. Recheck in one week.   Current Anticoagulation Instructions: INR-1.3 Take an extra 1/2 tablet today, then  take 2 tablets tomorrow,  Start new dosing schedule. Take 1.5 tablets daily except take 1 tablet on Thursdays . Return in 1 week.

## 2010-06-13 NOTE — Letter (Signed)
Summary: Eye Exam/[facility]   Eye Exam/[facility]   Imported By: Maryln Gottron 12/20/2009 12:46:31  _____________________________________________________________________  External Attachment:    Type:   Image     Comment:   External Document

## 2010-06-13 NOTE — Assessment & Plan Note (Signed)
Summary: 3 MONTH FOLLOW UP/[facility]   Vital Signs:  Patient profile:   70 year old female Height:      64 inches Weight:      257.25 pounds BMI:     44.32 O2 Sat:      96 % on 2 L/min Temp:     97.7 degrees F oral Pulse rate:   74 / minute Pulse rhythm:   regular Resp:     22 per minute BP sitting:   140 / 70  (left arm) Cuff size:   large  Vitals Entered By: Glendell Docker CMA (Sep 13, 2009 10:34 AM)  O2 Flow:  2 L/min CC: Rm 3- 3 Month Follow up Disease management Is Patient Diabetic? Yes     Last PAP Result Declined -Hyst   Primary Care Provider:  DThomos Lemons DO  CC:  Rm 3- 3 Month Follow up Disease management.  History of Present Illness: 70 y/o female for DM II f/u, high grade non hodgkins lymphoma for f/u blood sugar this am  152, low 104 high 275, elevation is usually in the afternoon  she had her port a cath removed last Thursday, Chemo was completed 07/19/2009.  she is feeling fatigued.  she has been in good spirits up to this point she has been more irritable A1c is much lower than her usual  she has intermittent aching sensation of right lower ext  Preventive Screening-Counseling & Management  Alcohol-Tobacco     Smoking Status: never  Allergies: 1)  ! Sulfa  Past History:  Past Medical History: ICD - IN SITU (ICD-V45.02) LBBB (ICD-426.3)  CAROTID BRUIT (ICD-785.9)   DYSLIPIDEMIA (ICD-272.4)  HYPERTENSION (ICD-401.9) CONGESTIVE HEART FAILURE (ICD-428.0) CVA (ICD-434.91) BACK PAIN, CHRONIC (ICD-724.5)   SLEEP APNEA (ICD-780.57) DIABETIC  RETINOPATHY (ICD-250.50)  CARDIOMYOPATHY, ISCHEMIC (ICD-414.8) COUGH (ICD-786.2) UTI'S, RECURRENT (ICD-599.0) ACUTE CYSTITIS (ICD-595.0) RHINOSINUSITIS, ACUTE (ICD-461.8) DYSURIA (ICD-788.1) UNSPECIFIED HYPOTHYROIDISM (ICD-244.9) BACKGROUND DIABETIC RETINOPATHY (ICD-362.01) CANDIDIASIS OF VULVA AND VAGINA (ICD-112.1) DIABETES MELLITUS, TYPE II, UNCONTROLLED (ICD-250.02)  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 Truett Perna  Past Surgical History: Hysterectomy - 1982     Gallbladder -- 1970  CABG -- 2003       Family History: Family History of Cancer:  Family History of Heart problems           Social History: Married   Alcohol use-no   Tobacco use-no     Retired     Regular Exercise - no  Drug Use - no     Physical Exam  General:  alert and overweight-appearing.   Head:  normocephalic and atraumatic.   Eyes:  pupils equal, pupils round, and pupils reactive to light.   Mouth:  pharynx pink and moist.   Lungs:  normal respiratory effort, normal breath sounds, no crackles, and no wheezes.   Heart:  normal rate, regular rhythm, and no gallop.   Abdomen:  soft, non-tender, and normal bowel sounds.   Extremities:  trace left pedal edema and trace right pedal edema.     Impression & Recommendations:  Problem # 1:  DIABETES MELLITUS, TYPE II, UNCONTROLLED (ICD-250.02)  A1c much bettter than usual.  I am worried pt having low blood sugars overnight.  reduce evening NPH  dose  Her updated medication list for this problem includes:    Enalapril Maleate 10 Mg Tabs (Enalapril maleate) ..... By mouth once daily    Humulin R 100 Unit/ml Soln (Insulin regular human) .Marland KitchenMarland KitchenMarland KitchenMarland Kitchen  15-25 units before meals    Novolin N 100 Unit/ml Susp (Insulin isophane human) ..... Inject 60 units in am and 20-30 units at night    Low-dose Aspirin 81 Mg Tabs (Aspirin) ..... One by mouth once daily  Labs Reviewed: Creat: 0.9 (09/06/2009)    Reviewed HgBA1c results: 6.2 (09/06/2009)  9.3 (12/14/2008)  Orders: Prescription Created Electronically [phone])  Problem # 2:  HYPERTENSION (ICD-401.9)  Maintain current medication regimen.  Her updated medication list for this problem includes:    Enalapril Maleate 10 Mg Tabs (Enalapril maleate) ..... By mouth once daily    Furosemide 40 Mg Tabs (Furosemide) ..... One and 1/2 tab daily    Coreg 25 Mg Tabs (Carvedilol) .Marland Kitchen... 2 by mouth two  times a day    Amlodipine Besylate 5 Mg Tabs (Amlodipine besylate) ..... One by mouth once daily  BP today: 140/70 Prior BP: 119/48 (08/12/2009)  Labs Reviewed: K+: 4.5 (09/06/2009) Creat: : 0.9 (09/06/2009)   Chol: 275 (12/14/2008)   HDL: 42.70 (12/14/2008)   LDL: DEL (06/15/2008)   TG: 669.0 (12/14/2008)  Orders: Prescription Created Electronically ([phone])  Complete Medication List: 1)  Crestor 10 Mg Tabs (Rosuvastatin calcium) .... By mouth once daily 2)  Enalapril Maleate 10 Mg Tabs (Enalapril maleate) .... By mouth once daily 3)  Citalopram Hydrobromide 20 Mg Tabs (Citalopram hydrobromide) .... By mouth once daily 4)  Furosemide 40 Mg Tabs (Furosemide) .... One and 1/2 tab daily 5)  Coreg 25 Mg Tabs (Carvedilol) .... 2 by mouth two times a day 6)  Amlodipine Besylate 5 Mg Tabs (Amlodipine besylate) .... One by mouth once daily 7)  Humulin R 100 Unit/ml Soln (Insulin regular human) .Marland KitchenMarland KitchenMarland Kitchen 15-25 units before meals 8)  Novolin N 100 Unit/ml Susp (Insulin isophane human) .... Inject 60 units in am and 20-30 units at night 9)  Low-dose Aspirin 81 Mg Tabs (Aspirin) .... One by mouth once daily 10)  Bd Insulin Syringe Ultrafine 30g X 1/2" 1 Ml Misc (Insulin syringe-needle u-100) .... Use for insulin injection four times a day  (250.02 dx code) 11)  Fish Oil 1200 Mg Caps (Omega-3 fatty acids) .... Take 2 capsules by mouth two times a day 12)  Vitamin D3 1000 Unit Caps (Cholecalciferol) .... Take 1 capsule by mouth once a day 13)  Multivitamins Tabs (Multiple vitamin) .... Take 1 tablet by mouth once a day 14)  Lorazepam 0.5 Mg Tabs (Lorazepam) .... Take 1 tab by mouth at bedtime 15)  Warfarin Sodium 5 Mg Tabs (Warfarin sodium) .... Take as directed per coumadin clinic 16)  Oxygen 2 L By [place]  .... Pt needs home oxygen and portable system. conserving device is okay. used at bedtime 17)  Calcium-vitamin D 600-200 Mg-unit Tabs (Calcium-vitamin d) .... Take 1 tablet by mouth once a day  Patient  Instructions: 1)  Please schedule a follow-up appointment in 1 month. 2)  BMP prior to visit, ICD-9:  401.9 3)  BNP:   428.00 4)  CBCD:  786.09 5)  Please return for lab work one (1) week before your next appointment.  Prescriptions: HUMULIN R 100 UNIT/ML SOLN (INSULIN REGULAR HUMAN) 15-25 units before meals  #1 month x 3   Entered and Authorized by:   [name]   Signed by:   [name] on 09/13/2009   Method used:   [facility]        [facility]  [address] [id number]  [address]       [place], [place]  [address]       Ph: [phone]       Fax: [phone]   RxID:   [id number] NOVOLIN N 100 UNIT/ML SUSP (INSULIN ISOPHANE HUMAN) inject 60 units in AM and 20-30 units at night  #1 month x 3   Entered and Authorized by:   [name]   Signed by:   [name] on 09/13/2009   Method used:   [facility]        [facility]  [address] [id number]       [address]       [place], [place]  [address]       Ph: [phone]       Fax: [phone]   RxID:   [id number] FUROSEMIDE 40 MG TABS (FUROSEMIDE) one and 1/2 tab daily  #45 x 3   Entered and Authorized by:   [name]   Signed by:   [name] on 09/13/2009   Method used:   [facility]        [facility]  [address] [id number]       [address]       [place], [place]  [address]       Ph: [phone]       Fax: [phone]   RxID:   [id number] AMLODIPINE BESYLATE 5 MG TABS (AMLODIPINE BESYLATE) one by mouth once daily  #90 x 3   Entered by:   Glendell Docker CMA   Authorized by:   [name]   Signed by:   Glendell Docker CMA on 09/13/2009   Method used:   Print then Give to Patient   RxID:   [id number] COREG 25 MG  TABS (CARVEDILOL) 2 by mouth two times a day  #360 x 3   Entered by:   Glendell Docker CMA   Authorized by:   [name]   Signed by:   Glendell Docker CMA on 09/13/2009   Method used:   Print then Give to Patient   RxID:   [id number]   Current Allergies: ! SULFA   Preventive Care Screening  Pap Smear:    Date:  09/13/2009    Results:  Declined -Hyst

## 2010-06-13 NOTE — Miscellaneous (Signed)
Summary: Home Care Report/ RESPIRATORY ASSESSMENT  Home Care Report/ RESPIRATORY ASSESSMENT   Imported By: Dorise Hiss 07/21/2009 09:20:23  _____________________________________________________________________  External Attachment:    Type:   Image     Comment:   External Document

## 2010-06-13 NOTE — Letter (Signed)
[facility]   Imported By: Lanelle Bal 06/13/2009 12:59:01  _____________________________________________________________________  External Attachment:    Type:   Image     Comment:   External Document

## 2010-06-13 NOTE — Progress Notes (Signed)
Summary: Torsemide refill  Phone Note Call from Patient Call back at Home Phone [phone]   Caller: Patient Reason for Call: Refill Medication Summary of Call: [facility] [address] [facility]semide, pt is currently taking 1.5 pills twice daily, she needs to get a refill, is going out of town Initial call taken by: Lannette Donath,  March 01, 2010 4:22 PM  Follow-up for Phone Call        [facility] are on [facility] for Torsemide. Spoke with Shanda Bumps, she states patient has refills and is able to pick one up . She states patient last filled medication on 01/26/2010. Call was returned to patient at [phone], she states she will be going out of town next week and tried to get a refill on her Torsemide. She states the pharmacy advised her that she would have to call the office for refills.  She was informed that I spoke with [facility], who informed me that  she  has rx's on file at the  pharmacy. Patient verbalized understanding and agrees to check back pharmacy for refill Follow-up by: Glendell Docker CMA,  March 01, 2010 4:48 PM

## 2010-06-13 NOTE — Assessment & Plan Note (Signed)
Summary: F/U PER PT CALL-JM  Medications Added PREDNISONE 20 MG TABS (PREDNISONE) use as directed ETOPOSIDE 50 MG CAPS (ETOPOSIDE) use as directed LORAZEPAM 1 MG TABS (LORAZEPAM) Take 1/2-11 tab by mouth at bedtime as directed.      Allergies Added:   Visit Type:  Follow-up Primary Provider:  Dondra Spry DO  CC:  follow-up visit.  History of Present Illness: the patient is a 70 year old female with a history of ischemic myopathy, coronary bypass grafting, status post CRT-D but now with normal ejection fraction at 60%. 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 This was associated with marked swelling in lymphedema of the left leg. The patient was referred to Dr. Elnita Maxwell and she underwent successful chemotherapy. Her leg swelling has resolved. There is no palpable mass present in the groin anymore. She is scheduled for CT scan of the groin. The patient does report shortness of breath on exertion. Shoulder shrug is sleep apnea but is unable to use her CPAP machine. She also complains of daytime somnolence. During recent ICD interrogation she was found to have paroxysmal atrial fibrillation.  We walked the patient he office today and on minimal exertion the patient's saturation dropped to 86%. She currently denies any chest pain orthopnea PND or syncope  Clinical Review Panels:  Lab Work HgbA1c 9.3 (12/14/2008) TSH 3.04 (08/06/2007) BUN 30 (12/14/2008) Creatinine 1.5 (12/14/2008)    Preventive Screening-Counseling & Management  Alcohol-Tobacco     Smoking Status: never  Current Medications (verified): 1)  Crestor 10 Mg  Tabs (Rosuvastatin Calcium) .... By Mouth Once Daily 2)  Enalapril Maleate 10 Mg  Tabs (Enalapril Maleate) .... By Mouth Once Daily 3)  Citalopram Hydrobromide 20 Mg  Tabs (Citalopram Hydrobromide) .... By Mouth Once Daily 4)  Furosemide 80 Mg  Tabs (Furosemide) .... By Mouth Once Daily 5)  Coreg 25 Mg   Tabs (Carvedilol) .... 2 By Mouth Two Times A Day 6)  Amlodipine Besylate 5 Mg Tabs (Amlodipine Besylate) .... One By Mouth Once Daily 7)  Humulin R 100 Unit/ml Soln (Insulin Regular Human) .... 20-30 Units Sliding Scale Before Meals 8)  Novolin N 100 Unit/ml Susp (Insulin Isophane Human) .... Inject 60 Units in Am and 40 Units At Night 9)  Low-Dose Aspirin 81 Mg  Tabs (Aspirin) .... One By Mouth Once Daily 10)  Bd Insulin Syringe Ultrafine 30g X 1/2" 1 Ml Misc (Insulin Syringe-Needle U-100) .... Use For Insulin Injection Four Times A Day  (250.02 Dx Code) 11)  Fish Oil 1200 Mg Caps (Omega-3 Fatty Acids) .... Take 2 Capsules By Mouth Two Times A Day 12)  Vitamin D3 1000 Unit Caps (Cholecalciferol) .... Take 1 Capsule By Mouth Once A Day 13)  Multivitamins  Tabs (Multiple Vitamin) .... Take 1 Tablet By Mouth Once A Day 14)  Lorazepam 0.5 Mg Tabs (Lorazepam) .... Take 1 Tab By Mouth At Bedtime 15)  Warfarin Sodium 5 Mg Tabs (Warfarin Sodium) .... Take As Directed Per Coumadin Clinic 16)  Prednisone 20 Mg Tabs (Prednisone) .... Use As Directed 17)  Etoposide 50 Mg Caps (Etoposide) .... Use As Directed  Allergies (verified): 1)  ! Sulfa  Comments:  Nurse/Medical Assistant: The patient's medications and allergies were reviewed with the patient and were updated in the Medication and Allergy Lists. Verbally gave names and dose.  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) COUGH (ICD-786.2) UTI'S, RECURRENT (ICD-599.0) ACUTE CYSTITIS (ICD-595.0) RHINOSINUSITIS, ACUTE (ICD-461.8) DYSURIA (ICD-788.1) UNSPECIFIED HYPOTHYROIDISM (ICD-244.9) BACKGROUND DIABETIC  RETINOPATHY (ICD-362.01) CANDIDIASIS OF VULVA AND VAGINA (ICD-112.1) DIABETES MELLITUS, TYPE II, UNCONTROLLED (ICD-250.02) CRI Left groin mass - Non Hodgkins lymphoma B type 02/2009  Past Surgical History: Last updated: 01/21/2009 Hysterectomy - 1982     Gallbladder -- 1970 CABG -- 2003     Family History: Last updated: 04/21/2009 Family History of Cancer:  Family History of Heart problems        Social History: Last updated: 04/21/2009 Married   Alcohol use-no   Tobacco use-no     Retired    Regular Exercise - no Drug Use - no   Risk Factors: Alcohol Use: 0 (09/21/2008) Caffeine Use: 2 beverages daily (09/21/2008) Exercise: no (09/21/2008)  Risk Factors: Smoking Status: never (06/21/2009)  Review of Systems       The patient complains of weight gain/loss, shortness of breath, and sleep apnea.  The patient denies fatigue, malaise, fever, vision loss, decreased hearing, hoarseness, chest pain, palpitations, prolonged cough, wheezing, coughing up blood, abdominal pain, blood in stool, nausea, vomiting, diarrhea, heartburn, incontinence, blood in urine, muscle weakness, joint pain, leg swelling, rash, skin lesions, headache, fainting, dizziness, depression, anxiety, enlarged lymph nodes, easy bruising or bleeding, and environmental allergies.    Vital Signs:  Patient profile:   70 year old female Height:      64 inches Weight:      247 pounds Pulse rate:   80 / minute BP sitting:   125 / 75  (left arm) Cuff size:   large  Vitals Entered By: Carlye Grippe (June 21, 2009 2:21 PM)  Serial Vital Signs/Assessments:  Comments: 3:02 PM  O2 Sat with walking:   Baseline:  O2 Sat_94_%   HR_82_  1 Minute:  O2 Sat_84_%   HR_94_  2 Minutes: O2 Sat_83_%   HR_95_  3 Minutes: O2 Sat_86_%   HR_96_  1 minute post walking:  O2 Sat_92_%   HR_78_  By: Cyril Loosen, RN, BSN   CC: follow-up visit   Physical Exam  Additional Exam:  General: overweight white  female, well-nourished in no distress head: Normocephalic and atraumatic eyes PERRLA/EOMI intact, conjunctiva and lids normal nose: No deformity or lesions mouth normal dentition, normal posterior pharynx neck: Supple, no JVD.  No masses, thyromegaly or abnormal cervical nodes. Normal carotid upstroke lungs: Normal breath sounds bilaterally without wheezing.  Normal percussion heart: regular rate and rhythm with normal S1 and S2, no S3 or S4.  PMI is normal.  No pathological murmurs abdomen: Normal bowel sounds, abdomen is soft and nontender without masses, organomegaly or hernias noted.  No hepatosplenomegaly musculoskeletal: Back normal, normal gait muscle strength and tone normal pulsus: Pulse is normal in all 4 extremities Extremities: No peripheral pitting edema neurologic: Alert and oriented x 3 skin: Intact without lesions or rashes cervical nodes: No significant adenopathy psychologic: Normal affect     ICD Specifications Following MD:  Lewayne Bunting, MD     Referring MD:  [facility] ICD Vendor:  [employer]     ICD Model Number:  V-343     ICD Serial Number:  [id number] ICD DOI:  08/02/2005      Lead 1:    Location: RA     DOI: 08/02/2005  Model #: [id number]     Serial #: [id number]     Status: active Lead 2:    Location: RV     DOI: 08/02/2005     Model #: [id number]     Serial #: [id number]     Status: active Lead 3:    Location: LV     DOI: 08/02/2005     Model #: [id number]     Serial #: [id number]     Status: active  Indications::  ICM   ICD Follow Up ICD Dependent:  No      Episodes Coumadin:  Yes  Brady Parameters Mode DDDR     Lower Rate Limit:  60     Upper Rate Limit 120 PAV 180     Sensed AV Delay:  130  Tachy Zones VF:  240     VT:  194     VT1:  174     Impression & Recommendations:  Problem # 1:  NON-HODGKIN'S LYMPHOMA (ICD-202.80) the patient has significant resolution of her swelling in the left leg. She continues to follow with Dr. Elnita Maxwell. The patient stated there is a  possibility she might require radiation therapy. A CT scan for followup is pending.  Problem # 2:  ICD - IN SITU (ICD-V45.02) Assessment: Comment Only  Problem # 3:  SLEEP APNEA (ICD-780.57) the patient states that she is unable to tolerate CPAP mask. She does report shortness of breath on minimal exertion. We did an abbreviated 6 minute walk in the office today and the patient's significant desaturation. I've ordered oxygen for at night as well as during activity during the daytime Orders: Misc. Referral (Misc. 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 She denies exertional chest pain. I do not think an ischemia workup is necessary at this point in time. Her updated medication list for this problem includes:    Enalapril Maleate 10 Mg Tabs (Enalapril maleate) ..... By mouth once daily    Furosemide 80 Mg Tabs (Furosemide) ..... By mouth once daily    Coreg 25 Mg Tabs (Carvedilol) .Marland Kitchen... 2 by mouth two times a day    Amlodipine Besylate 5 Mg Tabs (Amlodipine besylate) ..... One by mouth once daily    Low-dose Aspirin 81 Mg Tabs (Aspirin) ..... One by mouth once daily    Warfarin Sodium 5 Mg Tabs (Warfarin sodium) .Marland Kitchen... Take as directed per coumadin clinic  Orders: EKG w/ Interpretation (93000)  Problem # 5:  ATRIAL FIBRILLATION, PAROXYSMAL (ICD-427.31) on device interrogation the patient's proximal atrial. fibrillation. She is nonsymptomatic with this and I started her on Coumadin. Her updated medication list for this problem includes:    Coreg 25 Mg Tabs (Carvedilol) .Marland Kitchen... 2 by mouth two times a day    Low-dose Aspirin 81 Mg Tabs (Aspirin) ..... One by mouth once daily    Warfarin Sodium 5 Mg Tabs (Warfarin sodium) .Marland Kitchen... Take as directed per coumadin clinic  Patient Instructions: 1)  Your physician wants you to follow-up in: 6 months. You will receive a reminder letter in the mail one-two months in  advance. If you don't receive a letter, please call our office to schedule the follow-up appointment. 2)  Take Lorazepam 1mg  1/2-1 tablet by mouth at bedtime as directed. 3)  Keep PT/INR appt in [place] on Friday. Prescriptions: LORAZEPAM 1 MG TABS (LORAZEPAM) Take 1/2-11 tab by mouth at bedtime as directed.  #30 x 1  Entered by:   Cyril Loosen, RN, BSN   Authorized by:   Lewayne Bunting, MD, [facility]   Signed by:   Cyril Loosen, RN, BSN on 06/21/2009   Method used:   Handwritten   RxID:   [id number]   Appended Document: F/U PER PT CALL-JM    Clinical Lists Changes  Medications: Added new medication of * OXYGEN 2 L BY [place] Pt needs home oxygen and portable system. Conserving device is okay.

## 2010-06-13 NOTE — Medication Information (Signed)
Summary: rov/ewj   Anticoagulant Therapy  Managed by: Weston Brass, PharmD Referring MD: Lewayne Bunting PCP: Dondra Spry DO Supervising MD: Jens Som MD, Arlys John Indication 1: Left Ventricular Dysfunction (ICD-LVD) Indication 2: Implant date 08/02/05--global 90 days (ICD-**) Lab Used: [facility] Site: [facility] INR POC 2.6 INR RANGE 2 - 3  Dietary changes: no    Health status changes: no    Bleeding/hemorrhagic complications: no    Recent/future hospitalizations: no    Any changes in medication regimen? no    Recent/future dental: no  Any missed doses?: no       Is patient compliant with meds? yes       Allergies: 1)  ! Sulfa  Anticoagulation Management History:      The patient is taking warfarin and comes in today for a routine follow up visit.  Positive risk factors for bleeding include an age of 68 years or older, history of CVA/TIA, and presence of serious comorbidities.  The bleeding index is 'high risk'.  Positive CHADS2 values include History of CHF, History of HTN, History of Diabetes, and Prior Stroke/CVA/TIA.  Negative CHADS2 values include Age > 68 years old.  The start date was 09/20/2001.  Her last INR was 2.5.  Anticoagulation responsible provider: Jens Som MD, Arlys John.  INR POC: 2.6.  Cuvette Lot#: [id number].  Exp: 04/2011.    Anticoagulation Management Assessment/Plan:      The patient's current anticoagulation dose is Warfarin sodium 5 mg tabs: 7.5 mg by mouth 6 days a week and 5mg  by mouth one day per week.  The target INR is 2.0-3.0.  The next INR is due 04/21/2010.  Anticoagulation instructions were given to patient.  Results were reviewed/authorized by Weston Brass, PharmD.  She was notified by Weston Brass PharmD.         Prior Anticoagulation Instructions: INR 2.9  Continue on same dosage 1.5 tablets daily.  Recheck in 4 weeks.    Current Anticoagulation Instructions: INR 2.6  Continue same dose of 1 1/2 tablets every day.  Recheck INR in 4 weeks.

## 2010-06-13 NOTE — Miscellaneous (Signed)
Summary: Appointment Canceled  Appointment status changed to canceled by [employer] on 11/21/2009 11:13 AM.  Cancellation Comments --------------------- echo/chf/jml  Appointment Information ----------------------- Appt Type:  CARDIOLOGY ANCILLARY VISIT      Date:  Monday, November 21, 2009      Time:  4:00 PM for 60 min   Urgency:  Routine   Made By:  Pearson Grippe  To Visit:  [id number]    Reason:  echo/chf/jml  Appt Comments ------------- -- 11/21/09 11:13: ([facility]) CANCELED -- echo/chf/jml -- 11/17/09 12:06: ([facility]) BOOKED -- Routine CARDIOLOGY ANCILLARY VISIT at 11/21/2009 4:00 PM for 60 min echo/chf/jml -- 11/03/09 16:05: ([facility]) BOOKED -- Routine CARDIOLOGY ANCILLARY VISIT at 11/21/2009

## 2010-06-13 NOTE — Medication Information (Signed)
Summary: rov/ewj  Anticoagulant Therapy  Managed by: Bethena Midget, RN, BSN Referring MD: Lewayne Bunting PCP: Dondra Spry DO Supervising MD: Clifton James MD, Cristal Deer Indication 1: Left Ventricular Dysfunction (ICD-LVD) Indication 2: Implant date 08/02/05--global 90 days (ICD-**) Lab Used: [facility] Site: [facility] INR POC 1.4 INR RANGE 2 - 3  Dietary changes: no    Health status changes: no    Bleeding/hemorrhagic complications: no    Recent/future hospitalizations: no    Any changes in medication regimen? no    Recent/future dental: no  Any missed doses?: no       Is patient compliant with meds? yes      Comments: Last Chemo Tx on 07/19/09.   Allergies: 1)  ! Sulfa  Anticoagulation Management History:      The patient is taking warfarin and comes in today for a routine follow up visit.  Positive risk factors for bleeding include an age of 68 years or older, history of CVA/TIA, and presence of serious comorbidities.  The bleeding index is 'high risk'.  Positive CHADS2 values include History of CHF, History of HTN, History of Diabetes, and Prior Stroke/CVA/TIA.  Negative CHADS2 values include Age > 18 years old.  The start date was 09/20/2001.  Anticoagulation responsible provider: Clifton James MD, Cristal Deer.  INR POC: 1.4.  Cuvette Lot#: [id number].  Exp: 09/2010.    Anticoagulation Management Assessment/Plan:      The patient's current anticoagulation dose is Warfarin sodium 5 mg tabs: take as directed per coumadin clinic.  The target INR is 2.0-3.0.  The next INR is due 07/22/2009.  Anticoagulation instructions were given to patient.  Results were reviewed/authorized by Bethena Midget, RN, BSN.  She was notified by Bethena Midget, RN, BSN.         Prior Anticoagulation Instructions: INR 1.8  Start taking 1.5 tablets daily except 1 tablet on Sundays, Tuesdays, and Thursdays.  Recheck in 10 days.  Current Anticoagulation Instructions: INR 1.4 Today extra 2.5mg s, then change  dose to 7.5mg s everyday except 5mg s on Sundays and Thursdays. Recheck in 10 days.

## 2010-06-13 NOTE — Progress Notes (Signed)
Summary: Diabetic shoes--Lm 7/21  Phone Note Call from Patient Call back at Home Phone [phone]   Caller: Patient Summary of Call: Pt states that [facility] still needs another form Initial call taken by: Lannette Donath,  November 30, 2009 3:57 PM  Follow-up for Phone Call        Left message with pt's husband to have pt return my call.  Nicki Guadalajara Fergerson CMA Duncan Dull)  December 01, 2009 10:34 AM   Additional Follow-up for Phone Call Additional follow up Details #1::         call placed to [phone], patient states she went by [facility] yesterday and her concerns has been resolved. She states she will call them and make sure, but she is okay for now.  Additional Follow-up by: Glendell Docker CMA,  December 02, 2009 10:21 AM

## 2010-06-13 NOTE — Progress Notes (Signed)
Summary: Pain   Phone Note Call from Patient Call back at Home Phone [phone]   Caller: Patient Call For: [name] Summary of Call: patient called and left voice message stating she was discharged from the hospital last week. She is now having pain form her waist down to her knees and difficutly waliking.   Call was returned to patient at [phone], no answer, voice message was left for patient to return call Initial call taken by: Glendell Docker CMA,  December 27, 2009 11:57 AM  Follow-up for Phone Call        patient returned call, voice message was left stating she was home.  Call was returned to patient, she requested appointment for follow up with Dr Artist Pais following her hospitalization. She was given appointment for 12/28/2009 @ 8:30A Follow-up by: Glendell Docker CMA,  December 27, 2009 4:54 PM

## 2010-06-13 NOTE — Progress Notes (Signed)
Summary: echo done    Phone Note Call from Patient   Summary of Call: Called to let Dr. Andee Lineman know that she did have echo done at [facility]. while there recently.  D/C summary is in chart for review.    Initial call taken by: Hoover Brunette, LPN,  December 27, 2009 10:08 AM  Follow-up for Phone Call        Reviewed. Follow-up by: Lewayne Bunting, MD, [facility],  January 01, 2010 10:40 AM

## 2010-06-13 NOTE — Medication Information (Signed)
Summary: Destiny [name]  Anticoagulant Therapy  Managed by: Weston Brass, PharmD Referring MD: Lewayne Bunting PCP: Dondra Spry DO Supervising MD: Graciela Husbands MD, Viviann Spare Indication 1: Left Ventricular Dysfunction (ICD-LVD) Indication 2: Implant date 08/02/05--global 90 days (ICD-**) Lab Used: [facility] Site: [facility] INR POC 2.3 INR RANGE 2 - 3  Dietary changes: no    Health status changes: no    Bleeding/hemorrhagic complications: no    Recent/future hospitalizations: yes       Details: echo today, in hospital in June for SOB/fluid retention  Any changes in medication regimen? no    Recent/future dental: no  Any missed doses?: no       Is patient compliant with meds? yes       Allergies: 1)  ! Sulfa  Anticoagulation Management History:      The patient is taking warfarin and comes in today for a routine follow up visit.  Positive risk factors for bleeding include an age of 70 years or older, history of CVA/TIA, and presence of serious comorbidities.  The bleeding index is 'high risk'.  Positive CHADS2 values include History of CHF, History of HTN, History of Diabetes, and Prior Stroke/CVA/TIA.  Negative CHADS2 values include Age > 68 years old.  The start date was 09/20/2001.  Her last INR was 2.5.  Anticoagulation responsible provider: Graciela Husbands MD, Viviann Spare.  INR POC: 2.3.  Cuvette Lot#: [id number].  Exp: 02/2011.    Anticoagulation Management Assessment/Plan:      The patient's current anticoagulation dose is Warfarin sodium 5 mg tabs: 7.5 mg by mouth 6 days a week and 5mg  by mouth one day per week.  The target INR is 2.0-3.0.  The next INR is due 12/27/2009.  Anticoagulation instructions were given to patient.  Results were reviewed/authorized by Weston Brass, PharmD.  She was notified by Dillard Cannon.         Prior Anticoagulation Instructions: INR 2.5  Continue on same dosage 1.5 tablets daily.  Recheck in 2 weeks.    Current Anticoagulation Instructions: INR 2.3  Continue same dose  of 1.5 tabs daily.  Re-check INR in 4 weeks.

## 2010-06-13 NOTE — Progress Notes (Signed)
Summary: refill--BD insulin syringes  Phone Note Refill Request Message from:  Fax from [facility]acy [facility] [address] on 10/22/09 9:42 am  Refills Requested: Medication #1:  BD INSULIN SYRINGE ULTRAFINE 30G X 1/2" 1 ML MISC use for insulin injection four times a day  (250.02 Dx Code)   Dosage confirmed as above?Dosage Confirmed   Supply Requested: 200   Last Refilled: 09/01/2009 Next Appointment Scheduled: dr Yoo--01/17/10 Initial call taken by: Mervin Kung CMA,  October 24, 2009 8:56 AM [facility]  [address] [id number] Call For: Dr. Artist Pais     Prescriptions: BD INSULIN SYRINGE ULTRAFINE 30G X 1/2" 1 ML MISC (INSULIN SYRINGE-NEEDLE U-100) use for insulin injection four times a day  (250.02 Dx Code)  #200 x 3   Entered by:   Mervin Kung CMA   Authorized by:   [name]   Signed by:   Mervin Kung CMA on 10/24/2009   Method used:   [facility]        [facility]  [address] [id number]       [address]       [place], [place]  [address]       Ph: [phone]       Fax: [phone]   RxID:   [id number]

## 2010-06-13 NOTE — Medication Information (Signed)
Summary: [facility]  [facility]   Imported By: Lanelle Bal 08/25/2009 13:56:56  _____________________________________________________________________  External Attachment:    Type:   Image     Comment:   External Document

## 2010-06-13 NOTE — Miscellaneous (Signed)
Summary: Eye Exam  Clinical Lists Changes  Observations: Added new observation of DMEYEEXAMNXT: 12/2010 (12/13/2009 11:29) Added new observation of DMEYEEXMRES: normal (12/13/2009 11:29) Added new observation of EYE EXAM BY: [facility] (12/13/2009 11:29) Added new observation of DIAB EYE EX: normal (12/13/2009 11:29)       Diabetes Management Exam:    Eye Exam:       Eye Exam done elsewhere          Date: 12/13/2009          Results: normal          Done by: [facility]

## 2010-06-13 NOTE — Medication Information (Signed)
Summary: rov/tm  Anticoagulant Therapy  Managed by: Eda Keys, PharmD Referring MD: Lewayne Bunting PCP: Dondra Spry DO Supervising MD: Antoine Poche MD, Fayrene Fearing Indication 1: Left Ventricular Dysfunction (ICD-LVD) Indication 2: Implant date 08/02/05--global 90 days (ICD-**) Lab Used: [facility] Site: [facility] INR POC 2.2 INR RANGE 2 - 3  Dietary changes: no    Health status changes: no    Bleeding/hemorrhagic complications: no    Recent/future hospitalizations: no    Any changes in medication regimen? yes       Details: Took last chemo tx this past tuesday,   Recent/future dental: no  Any missed doses?: no       Is patient compliant with meds? yes       Allergies: 1)  ! Sulfa  Anticoagulation Management History:      The patient is taking warfarin and comes in today for a routine follow up visit.  Positive risk factors for bleeding include an age of 68 years or older, history of CVA/TIA, and presence of serious comorbidities.  The bleeding index is 'high risk'.  Positive CHADS2 values include History of CHF, History of HTN, History of Diabetes, and Prior Stroke/CVA/TIA.  Negative CHADS2 values include Age > 30 years old.  The start date was 09/20/2001.  Anticoagulation responsible provider: Antoine Poche MD, Fayrene Fearing.  INR POC: 2.2.  Exp: 09/2010.    Anticoagulation Management Assessment/Plan:      The patient's current anticoagulation dose is Warfarin sodium 5 mg tabs: take as directed per coumadin clinic.  The target INR is 2.0-3.0.  The next INR is due 08/05/2009.  Anticoagulation instructions were given to patient.  Results were reviewed/authorized by Eda Keys, PharmD.  She was notified by Eda Keys.         Prior Anticoagulation Instructions: INR 1.4 Today extra 2.5mg s, then change dose to 7.5mg s everyday except 5mg s on Sundays and Thursdays. Recheck in 10 days.   Current Anticoagulation Instructions: INR 2.2  Continue taking 1 tablet on Sunday and thursdsay  and 1.5 tablets all other days.  Return to clinic in 2 weeks.

## 2010-06-13 NOTE — Letter (Signed)
Summary: [facility] NOTE  Internal Correspondence/ [facility]   Imported By: Dorise Hiss 09/08/2009 14:27:35  _____________________________________________________________________  External Attachment:    Type:   Image     Comment:   External Document

## 2010-06-13 NOTE — Cardiovascular Report (Signed)
Summary: Office Visit Remote   Office Visit Remote   Imported By: Roderic Ovens 11/05/2009 11:53:38  _____________________________________________________________________  External Attachment:    Type:   Image     Comment:   External Document

## 2010-06-13 NOTE — Letter (Signed)
[facility]   Imported By: Maryln Gottron 12/20/2009 12:44:56  _____________________________________________________________________  External Attachment:    Type:   Image     Comment:   External Document

## 2010-06-13 NOTE — Medication Information (Signed)
Summary: rov/ln   Anticoagulant Therapy  Managed by: Weston Brass, PharmD Referring MD: Lewayne Bunting PCP: Dondra Spry DO Supervising MD: Graciela Husbands MD, Viviann Spare Indication 1: Left Ventricular Dysfunction (ICD-LVD) Indication 2: Implant date 08/02/05--global 90 days (ICD-**) Lab Used: [facility] Site: [facility] INR POC 2.3 INR RANGE 2 - 3  Dietary changes: no    Health status changes: no    Bleeding/hemorrhagic complications: no    Recent/future hospitalizations: yes       Details: Hospitalized last Tuesday through Friday for fluid overload.  Any changes in medication regimen? no    Recent/future dental: no  Any missed doses?: no       Is patient compliant with meds? yes       Allergies: 1)  ! Sulfa  Anticoagulation Management History:      The patient is taking warfarin and comes in today for a routine follow up visit.  Positive risk factors for bleeding include an age of 68 years or older, history of CVA/TIA, and presence of serious comorbidities.  The bleeding index is 'high risk'.  Positive CHADS2 values include History of CHF, History of HTN, History of Diabetes, and Prior Stroke/CVA/TIA.  Negative CHADS2 values include Age > 32 years old.  The start date was 09/20/2001.  Her last INR was 2.5.  Anticoagulation responsible provider: Graciela Husbands MD, Viviann Spare.  INR POC: 2.3.  Cuvette Lot#: [id number].  Exp: 02/2011.    Anticoagulation Management Assessment/Plan:      The patient's current anticoagulation dose is Warfarin sodium 5 mg tabs: 7.5 mg by mouth 6 days a week and 5mg  by mouth one day per week.  The target INR is 2.0-3.0.  The next INR is due 01/24/2010.  Anticoagulation instructions were given to patient.  Results were reviewed/authorized by Weston Brass, PharmD.  She was notified by Liana Gerold, PharmD Candidate.         Prior Anticoagulation Instructions: INR 2.3  Continue same dose of 1.5 tabs daily.  Re-check INR in 4 weeks.  Current Anticoagulation Instructions: INR  2.3  Continue with 1.5 tablets daily.  Return to clinic in 4 weeks.

## 2010-06-13 NOTE — Medication Information (Signed)
Summary: rov/ewj  Anticoagulant Therapy  Managed by: Weston Brass, PharmD Referring MD: Lewayne Bunting PCP: Dondra Spry DO Supervising MD: Shirlee Latch MD, Freida Busman Indication 1: Left Ventricular Dysfunction (ICD-LVD) Indication 2: Implant date 08/02/05--global 90 days (ICD-**) Lab Used: [facility] Site: [facility] INR POC 1.2 INR RANGE 2 - 3  Dietary changes: no    Health status changes: no    Bleeding/hemorrhagic complications: no    Recent/future hospitalizations: yes       Details: having port-a-cath removed next week.  Will speak to Dr. Ladona Ridgel or Dr. Andee Lineman to get surgical clearance   Any changes in medication regimen? yes       Details: decreased furosemide to 40mg    Recent/future dental: no  Any missed doses?: no       Is patient compliant with meds? yes       Allergies: 1)  ! Sulfa  Anticoagulation Management History:      The patient is taking warfarin and comes in today for a routine follow up visit.  Positive risk factors for bleeding include an age of 70 years or older, history of CVA/TIA, and presence of serious comorbidities.  The bleeding index is 'high risk'.  Positive CHADS2 values include History of CHF, History of HTN, History of Diabetes, and Prior Stroke/CVA/TIA.  Negative CHADS2 values include Age > 68 years old.  The start date was 09/20/2001.  Anticoagulation responsible provider: Shirlee Latch MD, Alija Riano.  INR POC: 1.2.  Cuvette Lot#: [id number].  Exp: 09/2010.    Anticoagulation Management Assessment/Plan:      The patient's current anticoagulation dose is Warfarin sodium 5 mg tabs: take as directed per coumadin clinic.  The target INR is 2.0-3.0.  The next INR is due 09/16/2009.  Anticoagulation instructions were given to patient.  Results were reviewed/authorized by Weston Brass, PharmD.  She was notified by Weston Brass PharmD.         Prior Anticoagulation Instructions: INR 2.2  Continue on same dosage 1.5 tablets daily except 1 tablet on Sundays and Thursdays.   Recheck in 3 weeks.    Current Anticoagulation Instructions: INR 1.2   Take an extra 1/2 tablet today, 2 tablets tomorrow then resume same dose of 1 1/2 tablets every day except 1 tablet on Sunday and Thursday.

## 2010-06-13 NOTE — Letter (Signed)
Summary: Remote Device Check  [employer], Main Office  [address]   [place], [place] [address]   Phone: [phone]  Fax: [phone]     November 01, 2009 MRN: [id number]   [facility] [address] [place], [place]  [address]   Dear Destiny [name],   Your remote transmission was recieved and reviewed by your physician.  All diagnostics were within normal limits for you.  __X___Your next transmission is scheduled for:  01-19-2010.  Please transmit at any time this day.  If you have a wireless device your transmission will be sent automatically.   Sincerely,  Vella Kohler

## 2010-06-13 NOTE — Letter (Signed)
Summary: [facility]  [facility]   Imported By: Claudette Laws 09/21/2009 14:38:09  _____________________________________________________________________  External Attachment:    Type:   Image     Comment:   External Document

## 2010-06-13 NOTE — Letter (Signed)
Summary: [facility]  [facility]   Imported By: Lanelle Bal 02/21/2010 09:49:21  _____________________________________________________________________  External Attachment:    Type:   Image     Comment:   External Document

## 2010-06-13 NOTE — Assessment & Plan Note (Signed)
Summary: defib check.sjm.amber  Medications Added * OXYGEN 2 L BY [place] Pt needs home oxygen and portable system. Conserving device is okay. used at bedtime        Visit Type:  defib check Primary Provider:  Dondra Spry DO  CC:  shortness of breath.  History of Present Illness: Mrs. Yearsley returns today for followup.  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  She feels well.  She has had complete resolution in the swelling of her leg.  She denies c/p or sob.  She still has her indwelling porta-cath.  Problems Prior to Update: 1)  Hypoxemia  (ICD-799.02) 2)  Dyspnea On Exertion  (ICD-786.09) 3)  Atrial Fibrillation, Paroxysmal  (ICD-427.31) 4)  Non-hodgkin's Lymphoma  (ICD-202.80) 5)  Lymphedema  (ICD-457.1) 6)  Mural Thrombus, Left Ventricle  (ICD-429.79) 7)  Diarrhea  (ICD-787.91) 8)  Weakness  (ICD-780.79) 9)  Icd - in Situ  (ICD-V45.02) 10)  Lbbb  (ICD-426.3) 11)  Carotid Bruit  (ICD-785.9) 12)  Dyslipidemia  (ICD-272.4) 13)  Hypertension  (ICD-401.9) 14)  Congestive Heart Failure  (ICD-428.0) 15)  Cva  (ICD-434.91) 16)  Back Pain, Chronic  (ICD-724.5) 17)  Sleep Apnea  (ICD-780.57) 18)  Diabetic Retinopathy  (ICD-250.50) 19)  Cardiomyopathy, Ischemic  (ICD-414.8) 20)  Cough  (ICD-786.2) 21)  Uti's, Recurrent  (ICD-599.0) 22)  Acute Cystitis  (ICD-595.0) 23)  Rhinosinusitis, Acute  (ICD-461.8) 24)  Dysuria  (ICD-788.1) 25)  Unspecified Hypothyroidism  (ICD-244.9) 26)  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 By Mouth Once Daily 2)  Enalapril Maleate 10 Mg  Tabs (Enalapril Maleate) .... By Mouth Once Daily 3)  Citalopram Hydrobromide 20 Mg  Tabs (Citalopram Hydrobromide) .... By  Mouth Once Daily 4)  Furosemide 80 Mg  Tabs (Furosemide) .... By Mouth Once Daily 5)  Coreg 25 Mg  Tabs (Carvedilol) .... 2 By Mouth Two Times A Day 6)  Amlodipine Besylate 5 Mg Tabs (Amlodipine Besylate) .... One By Mouth Once Daily 7)  Humulin R 100 Unit/ml Soln (Insulin Regular Human) .... 20-30 Units Sliding Scale Before Meals 8)  Novolin N 100 Unit/ml Susp (Insulin Isophane Human) .... Inject 60 Units in Am and 40 Units At Night 9)  Low-Dose Aspirin 81 Mg  Tabs (Aspirin) .... One By Mouth Once Daily 10)  Bd Insulin Syringe Ultrafine 30g X 1/2" 1 Ml Misc (Insulin Syringe-Needle U-100) .... Use For Insulin Injection Four Times A Day  (250.02 Dx Code) 11)  Fish Oil 1200 Mg Caps (Omega-3 Fatty Acids) .... Take 2 Capsules By Mouth Two Times A Day 12)  Vitamin D3 1000 Unit Caps (Cholecalciferol) .... Take 1 Capsule By Mouth Once A Day 13)  Multivitamins  Tabs (Multiple Vitamin) .... Take 1 Tablet By Mouth Once A Day 14)  Lorazepam 0.5 Mg Tabs (Lorazepam) .... Take 1 Tab By Mouth At Bedtime 15)  Warfarin Sodium 5 Mg Tabs (Warfarin Sodium) .... Take As Directed Per Coumadin Clinic 16)  Prednisone 20 Mg Tabs (Prednisone) .... Use As Directed 17)  Etoposide 50 Mg Caps (Etoposide) .... Use As Directed 18)  Lorazepam 1 Mg Tabs (Lorazepam) .... Take 1/2-11 Tab By Mouth At Bedtime As Directed. 19)  Oxygen 2 L By [place] .... Pt  Needs Home Oxygen and Portable System. Conserving Device Is Okay.  Allergies: 1)  ! Sulfa  Past History:  Past Medical History: Last updated: 06/29/2009 ICD - IN SITU (ICD-V45.02) LBBB (ICD-426.3)  CAROTID BRUIT (ICD-785.9)   DYSLIPIDEMIA (ICD-272.4)  HYPERTENSION (ICD-401.9) CONGESTIVE HEART FAILURE (ICD-428.0) CVA (ICD-434.91) BACK PAIN, CHRONIC (ICD-724.5)   SLEEP APNEA (ICD-780.57) DIABETIC  RETINOPATHY (ICD-250.50)  CARDIOMYOPATHY, ISCHEMIC (ICD-414.8) COUGH (ICD-786.2) UTI'S, RECURRENT (ICD-599.0) ACUTE CYSTITIS (ICD-595.0) RHINOSINUSITIS, ACUTE  (ICD-461.8) DYSURIA (ICD-788.1) UNSPECIFIED HYPOTHYROIDISM (ICD-244.9) BACKGROUND DIABETIC RETINOPATHY (ICD-362.01) CANDIDIASIS OF VULVA AND VAGINA (ICD-112.1) DIABETES MELLITUS, TYPE II, UNCONTROLLED (ICD-250.02) 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 Truett Perna  Past Surgical History: Last updated: 06/29/2009 Hysterectomy - 1982     Gallbladder -- 1970  CABG -- 2003      Review of Systems  The patient denies chest pain, syncope, dyspnea on exertion, and peripheral edema.    Vital Signs:  Patient profile:   70 year old female Height:      64 inches Weight:      254 pounds Pulse rate:   77 / minute BP sitting:   119 / 48  (left arm) Cuff size:   large  Vitals Entered By: Oswald Hillock (August 12, 2009 10:40 AM)  Physical Exam  General:  alert and overweight-appearing.   Head:  normocephalic and atraumatic. Alopecic. Eyes:  PERRLA/EOM intact; conjunctiva and lids normal. Neck:  Neck supple, no JVD. No masses, thyromegaly or abnormal cervical nodes. Chest Wall:  Normal ICD incision Lungs:  normal respiratory effort, normal breath sounds, no crackles, and no wheezes.   Heart:  normal rate, regular rhythm, and no gallop.   Abdomen:  obese, non-tender.  unable to palpate left groin mass Msk:  Back normal, difficulty with gait. Muscle strength and tone normal. Pulses:  pulses normal in all 4 extremities Extremities:  No lower extremity edema  Neurologic:  cranial nerves II-XII intact and gait normal.      ICD Specifications Following MD:  Lewayne Bunting, MD     Referring MD:  [facility] ICD Vendor:  [employer]     ICD Model Number:  V-343     ICD Serial Number:  [id number] ICD DOI:  08/02/2005      Lead 1:    Location: RA     DOI: 08/02/2005     Model #: [id number]     Serial #: [id number]     Status: active Lead 2:    Location: RV     DOI: 08/02/2005     Model #: 7001     Serial #: [id number]     Status: active Lead 3:    Location: LV     DOI: 08/02/2005      Model #: [id number]     Serial #: [id number]     Status: active  Indications::  ICM   ICD Follow Up Remote Check?  No Battery Voltage:  2.55 V     Charge Time:  12.0 seconds     Underlying rhythm:  Brady @ 54 ICD Dependent:  No       ICD Device Measurements Atrium:  Amplitude: 2.8 mV, Impedance: 365 ohms, Threshold: 0.75 V at 0.5 msec Right Ventricle:  Amplitude: 12 mV, Impedance: 430 ohms, Threshold: 0.75 V at 0.5 msec Left Ventricle:  Impedance: 485 ohms, Threshold: 1.75 V at 1.5 msec  Episodes MS Episodes:  7     Percent Mode Switch:  <1%  Coumadin:  Yes Shock:  0     ATP:  0     Nonsustained:  0     Atrial Pacing:  99%     Ventricular Pacing:  99%  Brady Parameters Mode DDDR     Lower Rate Limit:  60     Upper Rate Limit 120 PAV 150     Sensed AV Delay:  100  Tachy Zones VF:  240     VT:  194     VT1:  174     Next Remote Date:  10/17/2009     Next Cardiology Appt Due:  08/13/2010 Tech Comments:  LV reprogrammed 2.5@1 .5.  There were 7 mode switch episodes the longest > 1 day, she is on coumadin. Quick opt done today A-V delays reprogrammed as above.  IV delay  LV-> RV @ 60 msec.   Ms. Elbert just finished her chemo 3 weeks ago for non-Hodgkin Lymphoma.  Since her battery voltage with this Atlas device has reached 2.55 she will send her Merlin transmissions every 2 months and return in one year with Dr. Ladona Ridgel unless she reaches ERI. Altha Harm, LPN  August 12, 1025 10:54 AM  MD Comments:  Agree with above.  Impression & Recommendations:  Problem # 1:  ICD - IN SITU (ICD-V45.02) Her device is working normally with approximately one year of longevity.  Will followup in several months.  Problem # 2:  ATRIAL FIBRILLATION, PAROXYSMAL (ICD-427.31) She remains in NSR.  Continue meds as below. Her updated medication list for this problem includes:    Coreg 25 Mg Tabs (Carvedilol) .Marland Kitchen... 2 by mouth two times a day    Low-dose Aspirin 81 Mg Tabs (Aspirin) ..... One by mouth once daily     Warfarin Sodium 5 Mg Tabs (Warfarin sodium) .Marland Kitchen... Take as directed per coumadin clinic  Problem # 3:  CONGESTIVE HEART FAILURE (ICD-428.0) She appears to be class 1-2.  A low sodium diet is recommended.  Continue current meds. Her updated medication list for this problem includes:    Enalapril Maleate 10 Mg Tabs (Enalapril maleate) ..... By mouth once daily    Furosemide 80 Mg Tabs (Furosemide) ..... By mouth once daily    Coreg 25 Mg Tabs (Carvedilol) .Marland Kitchen... 2 by mouth two times a day    Amlodipine Besylate 5 Mg Tabs (Amlodipine besylate) ..... One by mouth once daily    Low-dose Aspirin 81 Mg Tabs (Aspirin) ..... One by mouth once daily    Warfarin Sodium 5 Mg Tabs (Warfarin sodium) .Marland Kitchen... Take as directed per coumadin clinic  Patient Instructions: 1)  Your physician wants you to follow-up in:12 months with Dr Ladona Ridgel.  You will receive a reminder letter in the mail two months in advance. If you don't receive a letter, please call our office to schedule the follow-up appointment.

## 2010-06-13 NOTE — Miscellaneous (Signed)
Summary: rx - coumadin  Clinical Lists Changes  Medications: Added new medication of WARFARIN SODIUM 5 MG TABS (WARFARIN SODIUM) take as directed per coumadin clinic - Signed Rx of WARFARIN SODIUM 5 MG TABS (WARFARIN SODIUM) take as directed per coumadin clinic;  #45 x 2;  Signed;  Entered by: Hoover Brunette, LPN;  Authorized by: Lewayne Bunting, MD, [facility];  Method [id number] [address] [place], [place], [place]  [address], Ph: [phone], Fax: [phone]    Prescriptions: WARFARIN SODIUM 5 MG TABS (WARFARIN SODIUM) take as directed per coumadin clinic  #45 x 2   Entered by:   Hoover Brunette, LPN   Authorized by:   Lewayne Bunting, MD, [facility]   Signed by:   Hoover Brunette, LPN on 65/78/4696   Method used:   [facility]        [facility]  [address] [id number]       [address]       [place], [place]  [address]       Ph: [phone]       Fax: [phone]   RxID:   [id number]

## 2010-06-13 NOTE — Medication Information (Signed)
Summary: rov/sp  Anticoagulant Therapy  Managed by: Bethena Midget, RN, BSN Referring MD: Lewayne Bunting PCP: Dondra Spry DO Supervising MD: Johney Frame MD, Fayrene Fearing Indication 1: Left Ventricular Dysfunction (ICD-LVD) Indication 2: Implant date 08/02/05--global 90 days (ICD-**) Lab Used: [facility] Site: [facility] INR POC 2.8 INR RANGE 2 - 3  Dietary changes: no    Health status changes: no    Bleeding/hemorrhagic complications: no    Recent/future hospitalizations: no    Any changes in medication regimen? no    Recent/future dental: no  Any missed doses?: no       Is patient compliant with meds? yes       Allergies: 1)  ! Sulfa  Anticoagulation Management History:      The patient is taking warfarin and comes in today for a routine follow up visit.  Positive risk factors for bleeding include an age of 68 years or older, history of CVA/TIA, and presence of serious comorbidities.  The bleeding index is 'high risk'.  Positive CHADS2 values include History of CHF, History of HTN, History of Diabetes, and Prior Stroke/CVA/TIA.  Negative CHADS2 values include Age > 32 years old.  The start date was 09/20/2001.  Her last INR was 2.5.  Anticoagulation responsible provider: Allred MD, Fayrene Fearing.  INR POC: 2.8.  Cuvette Lot#: [id number].  Exp: 06/2011.    Anticoagulation Management Assessment/Plan:      The patient's current anticoagulation dose is Warfarin sodium 5 mg tabs: 7.5 mg by mouth 6 days a week and 5mg  by mouth one day per week.  The target INR is 2.0-3.0.  The next INR is due 05/19/2010.  Anticoagulation instructions were given to patient.  Results were reviewed/authorized by Bethena Midget, RN, BSN.  She was notified by Bethena Midget, RN, BSN.         Prior Anticoagulation Instructions: INR 2.6  Continue same dose of 1 1/2 tablets every day.  Recheck INR in 4 weeks.   Current Anticoagulation Instructions: INR 2.8 Continue 7.5mg  everyday. Recheck in 4 weeks.

## 2010-06-13 NOTE — Progress Notes (Signed)
Summary: Lab Results  Phone Note Outgoing Call   Summary of Call: call pt - electrolytes and kidney function stable.  magnesium level is normal .  muscle enzymes are normal.  she can resume crestor. Initial call taken by: [name],  December 29, 2009 6:19 PM  Follow-up for Phone Call        call placed to patient at [phone] she has been advised per Dr Artist Pais instructions Follow-up by: Glendell Docker CMA,  December 30, 2009 12:58 PM

## 2010-06-13 NOTE — Letter (Signed)
Summary: Internal Correspondence/ [facility]  Internal Correspondence/ [facility]   Imported By: Dorise Hiss 06/27/2009 16:37:22  _____________________________________________________________________  External Attachment:    Type:   Image     Comment:   External Document

## 2010-06-13 NOTE — Letter (Signed)
Summary: [facility]  Internal Correspondence/ [facility]   Imported By: Dorise Hiss 07/07/2009 11:33:32  _____________________________________________________________________  External Attachment:    Type:   Image     Comment:   External Document

## 2010-06-13 NOTE — Progress Notes (Signed)
Summary: refill-- insulin syringe  Phone Note Refill Request Message from:  [facility] December 05[facility] 2009 9:53 AM  Refills Requested: Medication #1:  BD INSULIN SYRINGE ULTRAFINE 30G X 1/2" 1 ML MISC use for insulin injection four times a day  (250.02 Dx Code)   Dosage confirmed as above?Dosage Confirmed   Supply Requested: 200   Last Refilled: 10/22/2009 Next Appointment Scheduled: 01-17-10  Dr Artist Pais Initial call taken by: Mervin Kung CMA Duncan Dull),  December 05, 2009 9:53 AM    Prescriptions: BD INSULIN SYRINGE ULTRAFINE 30G X 1/2" 1 ML MISC (INSULIN SYRINGE-NEEDLE U-100) use for insulin injection four times a day  (250.02 Dx Code)  #200 x 3   Entered by:   Mervin Kung CMA (AAMA)   Authorized by:   [name]   Signed by:   Mervin Kung CMA (AAMA) on 12/05/2009   Method used:   [facility]        [facility]  [address] [id number]       [address]       [place], [place]  [address]       Ph: [phone]       Fax: [phone]   RxID:   [id number]

## 2010-06-13 NOTE — Progress Notes (Signed)
Summary: foot pain  Phone Note Call from Patient Call back at [phone]   Caller: Patient Call For: [name] Summary of Call: Received voice message from pt stating she is having extreme electrical shock sensations in her left foot since this morning. Gabapentin has not relieved it. Is there anything else pt can do?  Nicki Guadalajara Fergerson CMA Duncan Dull)  January 06, 2010 3:19 PM   Follow-up for Phone Call        see higher dose of gabapentin Follow-up by: [name],  January 06, 2010 5:03 PM  Additional Follow-up for Phone Call Additional follow up Details #1::        Left detailed message on home voicemail re: Dr Olegario Messier instructions. Nicki Guadalajara Fergerson CMA Duncan Dull)  January 06, 2010 5:12 PM     New/Updated Medications: NEURONTIN 300 MG CAPS (GABAPENTIN) one by mouth three times a day prn Prescriptions: NEURONTIN 300 MG CAPS (GABAPENTIN) one by mouth three times a day prn  #90 x 1   Entered and Authorized by:   [name]   Signed by:   [name] on 01/06/2010   Method used:   [facility]        [facility]  [address] [id number]       [address]       [place], [place]  [address]       Ph: [phone]       Fax: [phone]   RxID:   [id number]

## 2010-06-13 NOTE — Progress Notes (Signed)
Summary: PHONE:CANCEL ECHO   Phone Note Call from Patient   Caller: HUSBAND Summary of Call: recv telephone call from Destiny [name] husband. Destiny [name] has been admitted to [facility] and I will cancel echo that was scheduled for today 12-20-2009. Initial call taken by: Zachary George,  December 20, 2009 8:25 AM

## 2010-06-13 NOTE — Progress Notes (Signed)
Summary: echo   ---- Converted from flag ---- ---- 11/29/2009 12:57 PM, Cyril Loosen, RN, BSN wrote: Destiny [name], please review and d/w Dr. Earnestine Leys  ---- 11/29/2009 12:55 PM, Almetta Lovely wrote: Destiny [name], [facility] you are doing well.  Destiny [name] came in for her echocardiogram today as a 12:15 add on patient.  She had another procedure scheduled for 1:15. She had some lab work drawn, at that point she decided she would not have enough time to have her echo done and keep her [facility] appointment.  She said she told the person who scheduled the appointment it was too close to the other appointment.  Patient also believes she had an echo when she was in the [facility] in May and June.  She said she was billed for the echo (800.00).  I could not find an echo in the [employer] system, e-chart, or EMR.  I told the patient there was not echo.  She said she would check her records and talk to Dr Andee Lineman and would reschedule if need be.  Thanks ------------------------------  Phone Note Outgoing Call   Summary of Call: Spoke with pt and states that she did not have one in May.  Wants to reschedule at [facility].  Order changed and Vicky aware to reschedule.   Initial call taken by: Hoover Brunette, LPN,  December 05, 2009 3:35 PM

## 2010-06-13 NOTE — Medication Information (Signed)
Summary: Diabetic Shoes/[facility]  Diabetic Shoes/[facility]   Imported By: Lanelle Bal 11/18/2009 08:33:40  _____________________________________________________________________  External Attachment:    Type:   Image     Comment:   External Document

## 2010-06-13 NOTE — Letter (Signed)
Summary: [facility]  [facility]   Imported By: Lanelle Bal 02/07/2010 10:14:45  _____________________________________________________________________  External Attachment:    Type:   Image     Comment:   External Document

## 2010-06-13 NOTE — Miscellaneous (Signed)
Summary: EKG  Clinical Lists Changes  Observations: Added new observation of EKG INTERP: AV sequential paced rhythm heart rate 77 beats per minute (06/21/2009 16:33)      EKG  Procedure date:  06/21/2009  Findings:      AV sequential paced rhythm heart rate 77 beats per minute

## 2010-06-13 NOTE — Assessment & Plan Note (Signed)
Summary: DIFFICULTY BREATHING/LD   Vital Signs:  Patient profile:   70 year old female Weight:      257.50 pounds BMI:     44.36 O2 Sat:      85 % on 3 L/min Temp:     98.5 degrees F oral Pulse rate:   66 / minute Pulse rhythm:   irregular Resp:     24 per minute BP sitting:   130 / 70  (left arm) Cuff size:   large  Vitals Entered By: Glendell Docker CMA (Sep 28, 2009 11:14 AM)  O2 Flow:  3 L/min CC: Increase in shortness of breat Is Patient Diabetic? Yes Comments shortness of breath   Primary Care Provider:  DThomos Lemons DO  CC:  Increase in shortness of breat.  History of Present Illness:  CHF      This is a 70 year old woman with hx of CHF who c/o increase in SOB.  The patient reports dyspnea at rest, dyspnea on exertion, orthopnea, and leg edema.    she has cold symptoms and thought her symptoms may be due to URI  Allergies: 1)  ! Sulfa  Past History:  Past Medical History: ICD - IN SITU (ICD-V45.02) LBBB (ICD-426.3)  CAROTID BRUIT (ICD-785.9)    DYSLIPIDEMIA (ICD-272.4)  HYPERTENSION (ICD-401.9) CONGESTIVE HEART FAILURE (ICD-428.0) CVA (ICD-434.91) BACK PAIN, CHRONIC (ICD-724.5)   SLEEP APNEA (ICD-780.57) DIABETIC  RETINOPATHY (ICD-250.50)  CARDIOMYOPATHY, ISCHEMIC (ICD-414.8) COUGH (ICD-786.2) UTI'S, RECURRENT (ICD-599.0) ACUTE CYSTITIS (ICD-595.0) RHINOSINUSITIS, ACUTE (ICD-461.8) DYSURIA (ICD-788.1) UNSPECIFIED HYPOTHYROIDISM (ICD-244.9) BACKGROUND DIABETIC RETINOPATHY (ICD-362.01) CANDIDIASIS OF VULVA AND VAGINA (ICD-112.1) DIABETES MELLITUS, TYPE II, UNCONTROLLED (ICD-250.02)  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 Truett Perna  Past Surgical History: Hysterectomy - 1982     Gallbladder -- 1970  CABG -- 2003        Family History: Family History of Cancer:  Family History of Heart problems            Social History: Married   Alcohol use-no   Tobacco use-no      Retired     Regular Exercise - no  Drug Use  - no     Physical Exam  General:  alert and overweight-appearing.   Lungs:  increased resp effort,  bibasilar crackles Heart:  normal rate, regular rhythm, and no gallop.   Extremities:  trace left pedal edema and trace right pedal edema.   Neurologic:  cranial nerves II-XII intact.   Psych:  normally interactive and good eye contact.     Impression & Recommendations:  Problem # 1:  CONGESTIVE HEART FAILURE (ICD-428.0) Pt with acute exacerbation.   pt referred to ER for IV lasix and admission  Her updated medication list for this problem includes:    Enalapril Maleate 10 Mg Tabs (Enalapril maleate) ..... By mouth once daily    Furosemide 40 Mg Tabs (Furosemide) ..... One and 1/2 tab daily    Coreg 25 Mg Tabs (Carvedilol) .Marland Kitchen... 2 by mouth two times a day    Low-dose Aspirin 81 Mg Tabs (Aspirin) ..... One by mouth once daily    Warfarin Sodium 5 Mg Tabs (Warfarin sodium) .Marland Kitchen... Take as directed per coumadin clinic  Complete Medication List: 1)  Crestor 10 Mg Tabs (Rosuvastatin calcium) .... By mouth once daily 2)  Enalapril Maleate 10 Mg Tabs (Enalapril maleate) .... By mouth once daily 3)  Citalopram Hydrobromide 20 Mg Tabs (Citalopram hydrobromide) .... By mouth once daily 4)  Furosemide 40 Mg Tabs (Furosemide) .... One and 1/2 tab daily 5)  Coreg 25 Mg Tabs (Carvedilol) .... 2 by mouth two times a day 6)  Amlodipine Besylate 5 Mg Tabs (Amlodipine besylate) .... One by mouth once daily 7)  Humulin R 100 Unit/ml Soln (Insulin regular human) .Marland KitchenMarland KitchenMarland Kitchen 15-25 units before meals 8)  Novolin N 100 Unit/ml Susp (Insulin isophane human) .... Inject 60 units in am and 20-30 units at night 9)  Low-dose Aspirin 81 Mg Tabs (Aspirin) .... One by mouth once daily 10)  Bd Insulin Syringe Ultrafine 30g X 1/2" 1 Ml Misc (Insulin syringe-needle u-100) .... Use for insulin injection four times a day  (250.02 dx code) 11)  Fish Oil 1200 Mg Caps (Omega-3 fatty acids) .... Take 2 capsules by mouth two times  a day 12)  Vitamin D3 1000 Unit Caps (Cholecalciferol) .... Take 1 capsule by mouth once a day 13)  Multivitamins Tabs (Multiple vitamin) .... Take 1 tablet by mouth once a day 14)  Lorazepam 0.5 Mg Tabs (Lorazepam) .... Take 1 tab by mouth at bedtime 15)  Warfarin Sodium 5 Mg Tabs (Warfarin sodium) .... Take as directed per coumadin clinic 16)  Oxygen 2 L By [place]  .... Pt needs home oxygen and portable system. conserving device is okay. used at bedtime 17)  Calcium-vitamin D 600-200 Mg-unit Tabs (Calcium-vitamin d) .... Take 1 tablet by mouth once a day  Current Allergies (reviewed today): ! SULFA

## 2010-06-13 NOTE — Medication Information (Signed)
Summary: rov/mb  Anticoagulant Therapy  Managed by: Cloyde Reams, RN, BSN Referring MD: Lewayne Bunting PCP: Dondra Spry DO Supervising MD: Graciela Husbands MD, Viviann Spare Indication 1: Left Ventricular Dysfunction (ICD-LVD) Indication 2: Implant date 08/02/05--global 90 days (ICD-**) Lab Used: [facility] Site: [facility] INR POC 2.5 INR RANGE 2 - 3  Dietary changes: no    Health status changes: no    Bleeding/hemorrhagic complications: no    Recent/future hospitalizations: yes       Details: Pt was in hospital last week, d/c 11/09/09.  INR 1.6 advised to take 1.5 tablets daily.    Any changes in medication regimen? yes       Details: d/c citalipram, started on Cymbalta.   Recent/future dental: no  Any missed doses?: no       Is patient compliant with meds? yes       Allergies: 1)  ! Sulfa  Anticoagulation Management History:      The patient is taking warfarin and comes in today for a routine follow up visit.  Positive risk factors for bleeding include an age of 68 years or older, history of CVA/TIA, and presence of serious comorbidities.  The bleeding index is 'high risk'.  Positive CHADS2 values include History of CHF, History of HTN, History of Diabetes, and Prior Stroke/CVA/TIA.  Negative CHADS2 values include Age > 68 years old.  The start date was 09/20/2001.  Her last INR was 2.5.  Anticoagulation responsible provider: Graciela Husbands MD, Viviann Spare.  INR POC: 2.5.  Cuvette Lot#: [id number].  Exp: 01/2011.    Anticoagulation Management Assessment/Plan:      The patient's current anticoagulation dose is Warfarin sodium 5 mg tabs: 7.5 mg by mouth 6 days a week and 5mg  by mouth one day per week.  The target INR is 2.0-3.0.  The next INR is due 11/29/2009.  Anticoagulation instructions were given to patient.  Results were reviewed/authorized by Cloyde Reams, RN, BSN.  She was notified by Cloyde Reams RN.         Prior Anticoagulation Instructions: INR = 2.5 (goal = 2-3)  The patient is to continue  with the same dose of coumadin.  This dosage includes: Take 1 and 1/2 tablets on all days except for thursday take 1 tablet.  Current Anticoagulation Instructions: INR 2.5  Continue on same dosage 1.5 tablets daily.  Recheck in 2 weeks.

## 2010-06-13 NOTE — Progress Notes (Signed)
Summary: Diabetic Shoes  Phone Note Other Incoming   Caller: [facility] [phone] Summary of Call: Delorise Shiner called and left voice message requesting a return call regarding patient and diabetic Shoes Initial call taken by: Glendell Docker CMA,  November 09, 2009 10:02 AM  Follow-up for Phone Call        call returned to [place] at [phone], voice recording reached stating store are 9-5. Will call back Follow-up by: Glendell Docker CMA,  November 10, 2009 8:05 AM  Additional Follow-up for Phone Call Additional follow up Details #1::        she states patient is requesting diabetic shoes and she will need paper work completed in order for medicare to pay for them. Delorise Shiner states she will fax the information to the office for completion. Additional Follow-up by: Glendell Docker CMA,  November 10, 2009 2:52 PM    Additional Follow-up for Phone Call Additional follow up Details #2::    awaiting physician signature Follow-up by: Glendell Docker CMA,  November 11, 2009 11:35 AM  Additional Follow-up for Phone Call Additional follow up Details #3:: Details for Additional Follow-up Action Taken: form signed and faxed  Additional Follow-up by: Glendell Docker CMA,  November 15, 2009 10:19 AM

## 2010-06-13 NOTE — Letter (Signed)
Summary: Discharge Summary/ [facility]  Discharge Summary/ [facility]   Imported By: Dorise Hiss 06/21/2009 13:57:11  _____________________________________________________________________  External Attachment:    Type:   Image     Comment:   External Document

## 2010-06-13 NOTE — Assessment & Plan Note (Signed)
Summary: HOSPITAL FOLLOW UP/[facility]   Vital Signs:  Patient profile:   70 year old female Weight:      252.50 pounds BMI:     43.50 O2 Sat:      95 % on Room air Temp:     98.1 degrees F oral Pulse rate:   77 / minute Pulse rhythm:   regular Resp:     20 per minute BP sitting:   108 / 50  (right arm) Cuff size:   large  Vitals Entered By: Glendell Docker CMA (December 28, 2009 8:36 AM)  O2 Flow:  Room air CC: Hospital Follow up Is Patient Diabetic? Yes Did you bring your meter with you today? No Pain Assessment Patient in pain? yes     Location: knee Intensity: 3 Type: aching Onset of pain  With activity   Primary Care Provider:  Dondra Spry DO  CC:  Hospital Follow up.  History of Present Illness: 70 y/o white female for hosp f/u pt admitted with CHF exacerbation "The patient was taking Lasix at home and she noted she gained almost 10 pounds over the last 4-5 days.  The patient was experiencing worsening shortness of breath and presented to the ER for further evaluation."  she tries her best to follow low salt diet she recently stopped diet soft drinks CHF teaching performed during hosp  she c/o bilateral leg pains after hosp discharge some improvement with high K foods intake and low sugar gatorade she has hx of low back pain with significant spondylosis  Preventive Screening-Counseling & Management  Alcohol-Tobacco     Smoking Status: never  Allergies: 1)  ! Sulfa  Past History:  Past Medical History: ICD - IN SITU (ICD-V45.02) LBBB (ICD-426.3)  CAROTID BRUIT (ICD-785.9)     DYSLIPIDEMIA (ICD-272.4)  HYPERTENSION (ICD-401.9)  CONGESTIVE HEART FAILURE (ICD-428.0)  CVA (ICD-434.91) BACK PAIN, CHRONIC (ICD-724.5)   SLEEP APNEA (ICD-780.57) DIABETIC  RETINOPATHY (ICD-250.50)  CARDIOMYOPATHY, ISCHEMIC (ICD-414.8) COUGH (ICD-786.2) UTI'S, RECURRENT (ICD-599.0) ACUTE CYSTITIS (ICD-595.0) RHINOSINUSITIS, ACUTE (ICD-461.8) DYSURIA  (ICD-788.1) UNSPECIFIED HYPOTHYROIDISM (ICD-244.9) BACKGROUND DIABETIC RETINOPATHY (ICD-362.01) CANDIDIASIS OF VULVA AND VAGINA (ICD-112.1) DIABETES MELLITUS, TYPE II, UNCONTROLLED (ICD-250.02)  CRI  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 Truett Perna  Past Surgical History: Hysterectomy - 1982     Gallbladder -- 1970  CABG -- 2003           Family History: Family History of Cancer:  Family History of Heart problems              Social History: Married   Alcohol use-no   Tobacco use-no      Retired       Regular Exercise - no  Drug Use - no     Review of Systems  The patient denies weight gain and chest pain.         she denies palpiations.  no report of abnormal arrhthymia during hosp  Physical Exam  General:  alert and overweight-appearing.   Head:  normocephalic and atraumatic.   Ears:  R ear normal and L ear normal.   Mouth:  pharynx pink and moist.   Neck:  No deformities, masses, or tenderness noted. Lungs:  normal respiratory effort, normal breath sounds, no crackles, and no wheezes.   Heart:  normal rate, regular rhythm, and no gallop.   Abdomen:  soft, non-tender, normal bowel sounds, and no masses.   Extremities:  trace left pedal edema and trace right pedal edema.  Neurologic:  cranial nerves II-XII intact and gait normal.   Psych:  normally interactive, good eye contact, not anxious appearing, and not depressed appearing.     Impression & Recommendations:  Problem # 1:  CONGESTIVE HEART FAILURE (ICD-428.0) pt with several admissions for CHF.  A repeat echocardiogram was done during recent [facility] admission  which showed moderate dysfunction, moderate LV dysfunction and   moderate dilation of the cavity size.  There was grade 2 associated  diastolic dysfunction.  pt understands importance of strict salt and fluid restriction.  pt already on ACE and B Blocker.  change furosemide to torsemide. continue daily weights.  pt understands to take  extra dose of torsemide if  she gains more than 2 lbs over 24 hrs.   The following medications were removed from the medication list:    Furosemide 40 Mg Tabs (Furosemide) .Marland Kitchen... Take 1 1/2  tablet by mouth two times a day Her updated medication list for this problem includes:    Enalapril Maleate 10 Mg Tabs (Enalapril maleate) ..... By mouth once daily    Coreg 25 Mg Tabs (Carvedilol) .Marland Kitchen... 2 by mouth two times a day    Low-dose Aspirin 81 Mg Tabs (Aspirin) ..... One by mouth once daily    Warfarin Sodium 5 Mg Tabs (Warfarin sodium) .Marland Kitchen... 7.5 mg by mouth 6 days a week and 5mg  by mouth one day per week    Torsemide 20 Mg Tabs (Torsemide) ..... One and 1/2 tab by mouth two times a day  Orders: T-Basic Metabolic Panel [id number]) T-BNP  (B Natriuretic Peptide) ([id number])  Problem # 2:  LEG PAIN, BILATERAL (ICD-729.5) since hosp discharge pt c/o bilateral leg pains I doubt myopathy.  she has hx of back pain.  prev CT of L spine in 2005-  1.  Borderline to slight central canal stenosis at L4-5 due to   multiple factors with advanced bilateral facet degenerative joint   disease.  No MRI due to pace maker.  If leg symptoms get worse, we discussed neurosurgical referral for CT Myelogram  Orders: T-CK Total [id number]) T-Magnesium ([id number])  Problem # 3:  DIABETES MELLITUS, TYPE II, UNCONTROLLED (ICD-250.02) Pt reports blood sugars were higher in the hosp but returning to baseline  Her updated medication list for this problem includes:    Enalapril Maleate 10 Mg Tabs (Enalapril maleate) ..... By mouth once daily    Humulin R 100 Unit/ml Soln (Insulin regular human) ..... Inject subcutaneously 20 -30  units three times a day before meals    Novolin N 100 Unit/ml Susp (Insulin isophane human) ..... Inject subcutaneously 60 units  every morning  and 30 units every evening    Low-dose Aspirin 81 Mg Tabs (Aspirin) ..... One by mouth once daily  Complete Medication List: 1)  Crestor  10 Mg Tabs (Rosuvastatin calcium) .... By mouth once daily 2)  Enalapril Maleate 10 Mg Tabs (Enalapril maleate) .... By mouth once daily 3)  Coreg 25 Mg Tabs (Carvedilol) .... 2 by mouth two times a day 4)  Amlodipine Besylate 5 Mg Tabs (Amlodipine besylate) .... One by mouth once daily 5)  Humulin R 100 Unit/ml Soln (Insulin regular human) .... Inject subcutaneously 20 -30  units three times a day before meals 6)  Novolin N 100 Unit/ml Susp (Insulin isophane human) .... Inject subcutaneously 60 units  every morning  and 30 units every evening 7)  Low-dose Aspirin 81 Mg Tabs (Aspirin) .... One by mouth once daily 8)  Bd Insulin  Syringe Ultrafine 30g X 1/2" 1 Ml Misc (Insulin syringe-needle u-100) .... Use for insulin injection four times a day  (250.02 dx code) 9)  Fish Oil 1200 Mg Caps (Omega-3 fatty acids) .... Take 2 capsules by mouth two times a day 10)  Vitamin D3 1000 Unit Caps (Cholecalciferol) .... Take 1 capsule by mouth once a day 11)  Multivitamins Tabs (Multiple vitamin) .... Take 1 tablet by mouth once a day 12)  Lorazepam 0.5 Mg Tabs (Lorazepam) .... Take 1 tab by mouth at bedtime 13)  Warfarin Sodium 5 Mg Tabs (Warfarin sodium) .... 7.5 mg by mouth 6 days a week and 5mg  by mouth one day per week 14)  Oxygen 3.5 L By [place]  .... Pt needs home oxygen and portable system. conserving device is okay. used at bedtime 15)  Calcium-vitamin D 600-200 Mg-unit Tabs (Calcium-vitamin d) .... Take 1 tablet by mouth once a day 16)  Gabapentin 100 Mg Caps (Gabapentin) .... Take 1 tablet by mouth once a day as needed 17)  Citalopram Hydrobromide 20 Mg Tabs (Citalopram hydrobromide) .... Take 1 tablet by mouth once a day 18)  Torsemide 20 Mg Tabs (Torsemide) .... One and 1/2 tab by mouth two times a day  Patient Instructions: 1)  Hold Crestor 2)  If you gain more than 2 lbs in 24 hrs, take extra tab of torsemide 3)  Please schedule a follow-up appointment in 2 weeks. Prescriptions: LORAZEPAM 0.5 MG  TABS (LORAZEPAM) Take 1 tab by mouth at bedtime  #30 x 3   Entered and Authorized by:   [name]   Signed by:   [name] on 12/28/2009   Method used:   Print then Give to Patient   RxID:   [id number] TORSEMIDE 20 MG TABS (TORSEMIDE) one and 1/2 tab by mouth two times a day  #90 x 1   Entered and Authorized by:   [name]   Signed by:   [name] on 12/28/2009   Method used:   [facility] to        [facility]  [address] [id number]       [address]       [place], [place]  [address]       Ph: [phone]       Fax: [phone]   RxID:   [id number]    Current Allergies (reviewed today): ! SULFA

## 2010-06-13 NOTE — Progress Notes (Signed)
Summary: restart coumadin   Phone Note Call from Patient   Summary of Call: Spoke with pt.  Advised her that Dr. Andee Lineman did discuss with Dr. Alcide Evener about restarting her coumadin.  He agreed to restart coumadin at her previous dose of 5mg  every day except for 7.5mg  on M, W, F and will check INR on 2/1 at 11:15.  Shelby Dubin agreed with dose.  Pt. aware.   Will cont. to have routine cardiac care here in [place].  Coumadin management at [facility].   Initial call taken by: Hoover Brunette, LPN,  June 10, 2009 4:50 PM

## 2010-06-13 NOTE — Medication Information (Signed)
Summary: rov/sp  Anticoagulant Therapy  Managed by: Weston Brass, PharmD Referring MD: Lewayne Bunting PCP: Dondra Spry DO Supervising MD: Myrtis Ser MD, Tinnie Gens Indication 1: Left Ventricular Dysfunction (ICD-LVD) Indication 2: Implant date 08/02/05--global 90 days (ICD-**) Lab Used: [facility] Site: [facility] INR POC 2.4 INR RANGE 2 - 3  Dietary changes: no    Health status changes: no    Bleeding/hemorrhagic complications: no    Recent/future hospitalizations: no    Any changes in medication regimen? yes       Details: Fentanyl patch and hydrocodone started on 9/13  Recent/future dental: no  Any missed doses?: no       Is patient compliant with meds? yes       Allergies: 1)  ! Sulfa  Anticoagulation Management History:      The patient is taking warfarin and comes in today for a routine follow up visit.  Positive risk factors for bleeding include an age of 68 years or older, history of CVA/TIA, and presence of serious comorbidities.  The bleeding index is 'high risk'.  Positive CHADS2 values include History of CHF, History of HTN, History of Diabetes, and Prior Stroke/CVA/TIA.  Negative CHADS2 values include Age > 50 years old.  The start date was 09/20/2001.  Her last INR was 2.5.  Anticoagulation responsible provider: Myrtis Ser MD, Tinnie Gens.  INR POC: 2.4.  Cuvette Lot#: [id number].  Exp: 02/2011.    Anticoagulation Management Assessment/Plan:      The patient's current anticoagulation dose is Warfarin sodium 5 mg tabs: 7.5 mg by mouth 6 days a week and 5mg  by mouth one day per week.  The target INR is 2.0-3.0.  The next INR is due 02/24/2010.  Anticoagulation instructions were given to patient.  Results were reviewed/authorized by Weston Brass, PharmD.  She was notified by Harrel Carina, PharmD candidate.         Prior Anticoagulation Instructions: INR 2.3  Continue with 1.5 tablets daily.  Return to clinic in 4 weeks.  Current Anticoagulation Instructions: INR 2.4  Continue  taking 1 1/2 tablets everyday. Re-check INR in 4 weeks.

## 2010-06-13 NOTE — Progress Notes (Signed)
Summary: amlodipine refill  Phone Note Refill Request Message from:  Patient on August 17, 2009 2:13 PM  Refills Requested: Medication #1:  AMLODIPINE BESYLATE 5 MG TABS one by mouth once daily   Dosage confirmed as above?Dosage Confirmed   Brand Name Necessary? No   Supply Requested: 1 month [facility] [address]    [facility] Requested: Electronic Next Appointment Scheduled: 08-26-2009 145 coumadin  Initial call taken by: Roselle Locus,  August 17, 2009 2:14 PM    Prescriptions: AMLODIPINE BESYLATE 5 MG TABS (AMLODIPINE BESYLATE) one by mouth once daily  #30 x 0   Entered by:   Mervin Kung CMA   Authorized by:   [name]   Signed by:   Mervin Kung CMA on 08/18/2009   Method used:   [facility]        [facility]  [address] [id number]       [address]       [place], [place]  [address]       Ph: [phone]       Fax: [phone]   RxID:   [id number]

## 2010-06-13 NOTE — Cardiovascular Report (Signed)
Summary: Office Visit Remote   Office Visit Remote   Imported By: Roderic Ovens 06/01/2009 12:07:05  _____________________________________________________________________  External Attachment:    Type:   Image     Comment:   External Document

## 2010-06-13 NOTE — Progress Notes (Signed)
Summary: CPAP- NOTES  Phone Note From Other Clinic   Caller: [facility]  Call For: yoo Summary of Call: they faxed over on Monday the download titration from her c pap machine.  They are trying to replace her machine and medicare requires that the doctor has a face to face with patient or document the need for the c pap in the medical notes and that she benefits from the machine and then fax them that record [phone]).  If the settings need to be changed please fax an rx  Initial call taken by: Roselle Locus,  April 12, 2010 8:53 AM  Follow-up for Phone Call        is it okay to fax the last office note along with the referral order? Follow-up by: Glendell Docker CMA,  April 12, 2010 10:13 AM  Additional Follow-up for Phone Call Additional follow up Details #1::        yes,  ok to fax note Additional Follow-up by: [name],  April 14, 2010 5:10 PM    Additional Follow-up for Phone Call Additional follow up Details #2::    office note and referral  faxed to  [facility] at [phone] Follow-up by: Glendell Docker CMA,  April 17, 2010 9:49 AM

## 2010-06-13 NOTE — Medication Information (Signed)
Summary: ccr/jss  Anticoagulant Therapy  Managed by: Eda Keys, PharmD Referring MD: Lewayne Bunting PCP: Dondra Spry DO Supervising MD: Jens Som MD, Arlys John Indication 1: Left Ventricular Dysfunction (ICD-LVD) Indication 2: Implant date 08/02/05--global 90 days (ICD-**) Lab Used: [facility]  Site: [facility] INR POC 2.4 INR RANGE 2 - 3  Dietary changes: no    Health status changes: no    Bleeding/hemorrhagic complications: no    Recent/future hospitalizations: yes       Details: patient was d/c from hospital this past fri 5/20 for respiratory distress and CHF  Any changes in medication regimen? yes       Details: increased lasix dose in hospital  Recent/future dental: no  Any missed doses?: no       Is patient compliant with meds? yes      Comments: INR 1.6 at hospital discharge  Allergies: 1)  ! Sulfa  Anticoagulation Management History:      The patient is taking warfarin and comes in today for a routine follow up visit.  Positive risk factors for bleeding include an age of 73 years or older, history of CVA/TIA, and presence of serious comorbidities.  The bleeding index is 'high risk'.  Positive CHADS2 values include History of CHF, History of HTN, History of Diabetes, and Prior Stroke/CVA/TIA.  Negative CHADS2 values include Age > 34 years old.  The start date was 09/20/2001.  Anticoagulation responsible provider: Jens Som MD, Arlys John.  INR POC: 2.4.  Cuvette Lot#: [id number].  Exp: 12/2010.    Anticoagulation Management Assessment/Plan:      The patient's current anticoagulation dose is Warfarin sodium 5 mg tabs: take as directed per coumadin clinic.  The target INR is 2.0-3.0.  The next INR is due 10/17/2009.  Anticoagulation instructions were given to patient.  Results were reviewed/authorized by Eda Keys, PharmD.  She was notified by Eda Keys.         Prior Anticoagulation Instructions: INR-1.3 Take an extra 1/2 tablet today, then  take 2 tablets  tomorrow, Start new dosing schedule. Take 1.5 tablets daily except take 1 tablet on Thursdays . Return in 1 week.  Current Anticoagulation Instructions: INR 2.4  Continue taking 7.5 mg every day, except take 5 mg on Thursday.  Return to clinic in 2 weeks.

## 2010-06-13 NOTE — Assessment & Plan Note (Signed)
Summary: 6 month fu -recv letter vs  Medications Added CYMBALTA 30 MG CPEP (DULOXETINE HCL) Take 1 tablet by mouth once a day for 2 weeks CYMBALTA 30 MG CPEP (DULOXETINE HCL) Take 1 tablet by mouth once a day after initial 2 weeks of 30mg .      Allergies Added:   Visit Type:  Follow-up Primary Provider:  Dondra Spry DO   History of Present Illness: the patient is a 70 year old female with ischemic cardiomyopathy, status post coronary bypass grafting, status post CRT-D with a last known ejection fraction of 60%.  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  She was treated with chemotherapy and is currently in remission.  The patient also has diabetes and complains of significant pain consistent diabetic neuropathy.  The patient also has obstructive sleep apnea.  She uses oxygen at night.  He was recently admitted to [facility] which was presumed to be acute on chronic diastolic heart failure.  She developed some renal insufficiency secondary to diuresis.  She also has a history of paroxysmal atrial fibrillation and is on Coumadin but has remained in normal sinus rhythm.  Her BNP level however was not significantly elevated.  The patient reports some right leg edema.  She had Dopplers done and there was no evidence of DVT.  She feels very short of breath.  She is in NYHA class 3.  Her exercise tolerance is very limited.  She denies however any chest pain.  Preventive Screening-Counseling & Management  Alcohol-Tobacco     Smoking Status: never  Current Medications (verified): 1)  Crestor 10 Mg  Tabs (Rosuvastatin Calcium) .... By Mouth Once Daily 2)  Enalapril Maleate 10 Mg  Tabs (Enalapril Maleate) .... By Mouth Once Daily 3)  Citalopram Hydrobromide 20 Mg  Tabs (Citalopram Hydrobromide) .... By Mouth Once Daily 4)  Furosemide 80 Mg Tabs (Furosemide) .... Take 1 Tablet By Mouth Once A Day 5)  Coreg 25 Mg  Tabs (Carvedilol) .... 2 By  Mouth Two Times A Day 6)  Amlodipine Besylate 5 Mg Tabs (Amlodipine Besylate) .... One By Mouth Once Daily 7)  Humulin R 100 Unit/ml Soln (Insulin Regular Human) .Marland KitchenMarland KitchenMarland Kitchen 15-25 Units Before Meals 8)  Novolin N 100 Unit/ml Susp (Insulin Isophane Human) .... Inject 40-50 Units in Am and 20-30 Units At Night 9)  Low-Dose Aspirin 81 Mg  Tabs (Aspirin) .... One By Mouth Once Daily 10)  Bd Insulin Syringe Ultrafine 30g X 1/2" 1 Ml Misc (Insulin Syringe-Needle U-100) .... Use For Insulin Injection Four Times A Day  (250.02 Dx Code) 11)  Fish Oil 1200 Mg Caps (Omega-3 Fatty Acids) .... Take 2 Capsules By Mouth Two Times A Day 12)  Vitamin D3 1000 Unit Caps (Cholecalciferol) .... Take 1 Capsule By Mouth Once A Day 13)  Multivitamins  Tabs (Multiple Vitamin) .... Take 1 Tablet By Mouth Once A Day 14)  Lorazepam 0.5 Mg Tabs (Lorazepam) .... Take 1 Tab By Mouth At Bedtime 15)  Warfarin Sodium 5 Mg Tabs (Warfarin Sodium) .... 7.5 Mg By Mouth 6 Days A Week and 5mg  By Mouth One Day Per Week 16)  Oxygen 3.5 L By Plato .... Pt Needs Home Oxygen and Portable System. Conserving Device Is Okay. Used At Bedtime 17)  Calcium-Vitamin D 600-200 Mg-Unit Tabs (Calcium-Vitamin D) .... Take 1 Tablet By Mouth Once A Day 18)  Gabapentin 100 Mg Caps (Gabapentin) .... One By Mouth Bid  Allergies (verified): 1)  !  Sulfa  Comments:  Nurse/Medical Assistant: The patient's medication list and allergies were reviewed with the patient and were updated in the Medication and Allergy Lists.  Past History:  Past Medical History: Last updated: 09/28/2009 ICD - IN SITU (ICD-V45.02) LBBB (ICD-426.3)  CAROTID BRUIT (ICD-785.9)    DYSLIPIDEMIA (ICD-272.4)  HYPERTENSION (ICD-401.9) CONGESTIVE HEART FAILURE (ICD-428.0) CVA (ICD-434.91) BACK PAIN, CHRONIC (ICD-724.5)   SLEEP APNEA (ICD-780.57) DIABETIC  RETINOPATHY (ICD-250.50)  CARDIOMYOPATHY, ISCHEMIC (ICD-414.8) COUGH (ICD-786.2) UTI'S, RECURRENT (ICD-599.0) ACUTE CYSTITIS  (ICD-595.0) RHINOSINUSITIS, ACUTE (ICD-461.8) DYSURIA (ICD-788.1) UNSPECIFIED HYPOTHYROIDISM (ICD-244.9) BACKGROUND DIABETIC RETINOPATHY (ICD-362.01) CANDIDIASIS OF VULVA AND VAGINA (ICD-112.1) DIABETES MELLITUS, TYPE II, UNCONTROLLED (ICD-250.02)  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 Truett Perna  Past Surgical History: Last updated: 09/28/2009 Hysterectomy - 1982     Gallbladder -- 1970  CABG -- 2003        Family History: Last updated: 09/28/2009 Family History of Cancer:  Family History of Heart problems            Social History: Last updated: 09/28/2009 Married   Alcohol use-no   Tobacco use-no      Retired     Regular Exercise - no  Drug Use - no     Risk Factors: Alcohol Use: 0 (09/21/2008) Caffeine Use: 2 beverages daily (09/21/2008) Exercise: no (09/21/2008)  Risk Factors: Smoking Status: never (10/28/2009)  Review of Systems       The patient complains of dyspnea on exertion and muscle weakness.  The patient denies fatigue, malaise, fever, weight gain/loss, vision loss, decreased hearing, hoarseness, chest pain, palpitations, shortness of breath, prolonged cough, wheezing, sleep apnea, coughing up blood, abdominal pain, blood in stool, nausea, vomiting, diarrhea, heartburn, incontinence, blood in urine, joint pain, leg swelling, rash, skin lesions, headache, fainting, dizziness, depression, anxiety, enlarged lymph nodes, easy bruising or bleeding, and environmental allergies.    Vital Signs:  Patient profile:   70 year old female Height:      64 inches Weight:      252 pounds O2 Sat:      94 % on Room air Pulse rate:   79 / minute BP sitting:   125 / 80  (left arm) Cuff size:   large  Vitals Entered By: Carlye Grippe (October 28, 2009 11:20 AM)  O2 Flow:  Room air  Physical Exam  Additional Exam:  General: overweight white female, well-nourished in no distress head: Normocephalic and atraumatic eyes PERRLA/EOMI intact,  conjunctiva and lids normal nose: No deformity or lesions mouth normal dentition, normal posterior pharynx neck: Supple, no JVD.  No masses, thyromegaly or abnormal cervical nodes. Normal carotid upstroke lungs: Normal breath sounds bilaterally without wheezing.  Normal percussion heart: regular rate and rhythm with normal S1 and S2, no S3 or S4.  PMI is normal.  No pathological murmurs abdomen: Normal bowel sounds, abdomen is soft and nontender without masses, organomegaly or hernias noted.  No hepatosplenomegaly musculoskeletal: Back normal, normal gait muscle strength and tone normal pulsus: Pulse is normal in all 4 extremities Extremities: No peripheral pitting edema neurologic: Alert and oriented x 3 skin: Intact without lesions or rashes cervical nodes: No significant adenopathy psychologic: Normal affect    EKG  Procedure date:  10/28/2009  Findings:      AV sequential paced rhythm.   ICD Specifications Following MD:  Lewayne Bunting, MD     Referring MD:  Andee Lineman ICD Vendor:  [employer]     ICD Model Number:  V-343     ICD Serial Number:  [id number] ICD DOI:  08/02/2005      Lead 1:    Location: RA     DOI: 08/02/2005     Model #: [id number]     Serial #: [id number]     Status: active Lead 2:    Location: RV     DOI: 08/02/2005     Model #: 7001     Serial #: [id number]     Status: active Lead 3:    Location: LV     DOI: 08/02/2005     Model #: 1058T     Serial #: [id number]     Status: active  Indications::  ICM   ICD Follow Up ICD Dependent:  No      Episodes Coumadin:  Yes  Brady Parameters Mode DDDR     Lower Rate Limit:  60     Upper Rate Limit 120 PAV 150     Sensed AV Delay:  100  Tachy Zones VF:  240     VT:  194     VT1:  174     Impression & Recommendations:  Problem # 1:  DYSPNEA ON EXERTION (ICD-786.09) the patient has worsening dyspnea and exertion.  We will repeat an echocardiogram to reassess her LV function.  If she has LV dysfunction she will need an ischemia  workup.  Other possibilities include underlying lung disease and is no definite evidence of ischemia the patient may require pulmonary function studies Her updated medication list for this problem includes:    Enalapril Maleate 10 Mg Tabs (Enalapril maleate) ..... By mouth once daily    Furosemide 80 Mg Tabs (Furosemide) .Marland Kitchen... Take 1 tablet by mouth once a day    Coreg 25 Mg Tabs (Carvedilol) .Marland Kitchen... 2 by mouth two times a day    Amlodipine Besylate 5 Mg Tabs (Amlodipine besylate) ..... One by mouth once daily    Low-dose Aspirin 81 Mg Tabs (Aspirin) ..... One by mouth once daily  Orders: 2-D Echocardiogram (2D Echo)  Problem # 2:  ATRIAL FIBRILLATION, PAROXYSMAL (ICD-427.31) the patient remained in AV sequential paced rhythm.  She remains on Coumadin Her updated medication list for this problem includes:    Coreg 25 Mg Tabs (Carvedilol) .Marland Kitchen... 2 by mouth two times a day    Low-dose Aspirin 81 Mg Tabs (Aspirin) ..... One by mouth once daily    Warfarin Sodium 5 Mg Tabs (Warfarin sodium) .Marland Kitchen... 7.5 mg by mouth 6 days a week and 5mg  by mouth one day per week  Orders: EKG w/ Interpretation (93000)  Problem # 3:  ICD - IN SITU (ICD-V45.02) Assessment: Comment Only  Problem # 4:  DIABETES MELLITUS, WITH AUTONOMIC NEUROPATHY (ICD-250.60) the patient has significant symptoms of neuropathy.  We will change the citalopram to the duloxetine.we will do a careful cross taper and started initially on 30 minute a few daily x 2 weeks then increase to 60 mg p.o. daily Her updated medication list for this problem includes:    Enalapril Maleate 10 Mg Tabs (Enalapril maleate) ..... By mouth once daily    Humulin R 100 Unit/ml Soln (Insulin regular human) .Marland KitchenMarland KitchenMarland KitchenMarland Kitchen 15-25 units before meals    Novolin N 100 Unit/ml Susp (Insulin isophane human) ..... Inject 40-50 units in am and 20-30 units at night    Low-dose Aspirin 81 Mg Tabs (Aspirin) ..... One by mouth once daily  Patient Instructions: 1)  Stop  Citalopram. 2)  Start  Cymbalta 30mg  by mouth once daily for 1 week. Then increase to 60mg  by mouth once daily. 3)  Your physician has requested that you have an echocardiogram.  Echocardiography is a painless test that uses sound waves to create images of your heart. It provides your doctor with information about the size and shape of your heart and how well your heart's chambers and valves are working.  This procedure takes approximately one hour. There are no restrictions for this procedure. This will be done in the [place] office. If the results of your test are normal or stable, you will receive a letter. If they are abnormal, the nurse will contact you by phone.  4)  Your physician wants you to follow-up in: 6 months. You will receive a reminder letter in the mail one-two months in advance. If you don't receive a letter, please call our office to schedule the follow-up appointment. Prescriptions: CYMBALTA 30 MG CPEP (DULOXETINE HCL) Take 1 tablet by mouth once a day after initial 2 weeks of 30mg .  #30 x 3   Entered by:   Cyril Loosen, RN, BSN   Authorized by:   Lewayne Bunting, MD, [facility]   Signed by:   Cyril Loosen, RN, BSN on 10/28/2009   Method used:   [facility]ron[facility] to        [facility]       [address]       [place], [place]  [address]       Ph: [phone]       Fax: [phone]   RxID:   [id number] CYMBALTA 30 MG CPEP (DULOXETINE HCL) Take 1 tablet by mouth once a day for 2 weeks  #14 x 0   Entered by:   Cyril Loosen, RN, BSN   Authorized by:   Lewayne Bunting, MD, [facility]   Signed by:   Cyril Loosen, RN, BSN on 10/28/2009   Method used:   [facility] to        [id number]       [address]       [place], [place]  [address]       Ph: [phone]       Fax: [phone]   RxID:   [id number]   Handout requested.

## 2010-06-13 NOTE — Procedures (Signed)
Summary: Cardiology Device Clinic    Allergies: 1)  ! Sulfa   ICD Specifications Following MD:  Lewayne Bunting, MD     Referring MD:  [facility] ICD Vendor:  [employer]     ICD Model Number:  V-343     ICD Serial Number:  [id number] ICD DOI:  08/02/2005      Lead 1:    Location: RA     DOI: 08/02/2005     Model #: 1788TC     Serial #: [id number]     Status: active Lead 2:    Location: RV     DOI: 08/02/2005     Model #: 7001     Serial #: [id number]     Status: active Lead 3:    Location: LV     DOI: 08/02/2005     Model #: 1058T     Serial #: [id number]     Status: active  Indications::  ICM   ICD Follow Up Remote Check?  No Battery Voltage:  2.55 V     Charge Time:  12.3 seconds     Battery Est. Longevity:  6 months ICD Dependent:  No       ICD Device Measurements Atrium:  Amplitude: 2.2 mV, Impedance: 370 ohms,  Right Ventricle:  Amplitude: 12 mV, Impedance: 385 ohms,   Episodes Coumadin:  Yes  Brady Parameters Mode DDDR     Lower Rate Limit:  60     Upper Rate Limit 120 PAV 150     Sensed AV Delay:  100  Tachy Zones VF:  240     VT:  194     VT1:  174     Tech Comments:  RV reprogrammed 2.5, Far R suppression .  Checked by [employer].  Atlas battery near ERI with 6-8 months longevity per tech services @ [employer].  Merlin transmissions every 3 months.

## 2010-06-13 NOTE — Letter (Signed)
Summary: [facility]  Internal Correspondence/ [facility]   Imported By: Dorise Hiss 03/16/2010 08:58:02  _____________________________________________________________________  External Attachment:    Type:   Image     Comment:   External Document

## 2010-06-13 NOTE — Progress Notes (Signed)
Summary: Rx Novolon  Phone Note Call from Patient Call back at Home Phone [phone]   Caller: Patient Call For: [name] Reason for Call: Refill Medication Summary of Call: Pt would like to pick up 3 Novolon today at [facility] [address] Initial call taken by: Lannette Donath,  August 22, 2009 9:17 AM  Follow-up for Phone Call        Notified pt. that refill was sent to pharmacy.  Mervin Kung CMA  August 22, 2009 9:44 AM

## 2010-06-13 NOTE — Medication Information (Signed)
Summary: Destiny [name]  Anticoagulant Therapy  Managed by: Cloyde Reams, RN, BSN Referring MD: Lewayne Bunting PCP: Dondra Spry DO Supervising MD: Riley Kill MD, Maisie Fus Indication 1: Left Ventricular Dysfunction (ICD-LVD) Indication 2: Implant date 08/02/05--global 90 days (ICD-**) Lab Used: [facility] [place] Site: [facility] INR POC 2.9 INR RANGE 2 - 3  Dietary changes: no    Health status changes: no    Bleeding/hemorrhagic complications: no    Recent/future hospitalizations: no    Any changes in medication regimen? no    Recent/future dental: no  Any missed doses?: no       Is patient compliant with meds? yes       Allergies: 1)  ! Sulfa  Anticoagulation Management History:      The patient is taking warfarin and comes in today for a routine follow up visit.  Positive risk factors for bleeding include an age of 68 years or older, history of CVA/TIA, and presence of serious comorbidities.  The bleeding index is 'high risk'.  Positive CHADS2 values include History of CHF, History of HTN, History of Diabetes, and Prior Stroke/CVA/TIA.  Negative CHADS2 values include Age > 68 years old.  The start date was 09/20/2001.  Her last INR was 2.5.  Anticoagulation responsible provider: Riley Kill MD, Maisie Fus.  INR POC: 2.9.  Cuvette Lot#: [id number].  Exp: 03/2011.    Anticoagulation Management Assessment/Plan:      The patient's current anticoagulation dose is Warfarin sodium 5 mg tabs: 7.5 mg by mouth 6 days a week and 5mg  by mouth one day per week.  The target INR is 2.0-3.0.  The next INR is due 03/24/2010.  Anticoagulation instructions were given to patient.  Results were reviewed/authorized by Cloyde Reams, RN, BSN.  She was notified by Cloyde Reams RN.         Prior Anticoagulation Instructions: INR 2.4  Continue taking 1 1/2 tablets everyday. Re-check INR in 4 weeks.   Current Anticoagulation Instructions: INR 2.9  Continue on same dosage 1.5 tablets daily.  Recheck in 4 weeks.

## 2010-06-13 NOTE — Progress Notes (Signed)
Summary: Humulin Refill  Phone Note Refill Request Message from:  Fax from Pharmacy on August 30, 2009 3:26 PM  Refills Requested: Medication #1:  HUMULIN R 100 UNIT/ML SOLN 20-30 units sliding scale before meals   Last Refilled: 07/16/2009  Method Requested: Electronic Next Appointment Scheduled: 4.20.11  Follow-up for Phone Call        Rx completed in Dr. Tiajuana Amass Follow-up by: Glendell Docker CMA,  August 30, 2009 4:13 PM    Prescriptions: HUMULIN R 100 UNIT/ML SOLN (INSULIN REGULAR HUMAN) 20-30 units sliding scale before meals  #30 x 4   Entered by:   Glendell Docker CMA   Authorized by:   [name]   Signed by:   Glendell Docker CMA on 08/30/2009   Method used:   [facility]        [facility]       [address]       [place], [place]  [address]       Ph: [phone]       Fax: [phone]   RxID:   [id number]

## 2010-06-13 NOTE — Medication Information (Signed)
Summary: rov/eac  Anticoagulant Therapy  Managed by: Cloyde Reams, RN, BSN Referring MD: Lewayne Bunting PCP: Dondra Spry DO Supervising MD: Tenny Craw MD, Gunnar Fusi Indication 1: Left Ventricular Dysfunction (ICD-LVD) Indication 2: Implant date 08/02/05--global 90 days (ICD-**) Lab Used: [facility] [place] [facility] Site: [facility] INR POC 2.2 INR RANGE 2 - 3  Dietary changes: no    Health status changes: no    Bleeding/hemorrhagic complications: no    Recent/future hospitalizations: no    Any changes in medication regimen? no    Recent/future dental: no  Any missed doses?: no       Is patient compliant with meds? yes       Allergies (verified): 1)  ! Sulfa  Anticoagulation Management History:      The patient is taking warfarin and comes in today for a routine follow up visit.  Positive risk factors for bleeding include an age of 68 years or older, history of CVA/TIA, and presence of serious comorbidities.  The bleeding index is 'high risk'.  Positive CHADS2 values include History of CHF, History of HTN, History of Diabetes, and Prior Stroke/CVA/TIA.  Negative CHADS2 values include Age > 67 years old.  The start date was 09/20/2001.  Anticoagulation responsible provider: Tenny Craw MD, Gunnar Fusi.  INR POC: 2.2.  Cuvette Lot#: [id number].  Exp: 09/2010.    Anticoagulation Management Assessment/Plan:      The patient's current anticoagulation dose is Warfarin sodium 5 mg tabs: take as directed per coumadin clinic.  The target INR is 2.0-3.0.  The next INR is due 08/26/2009.  Anticoagulation instructions were given to patient.  Results were reviewed/authorized by Cloyde Reams, RN, BSN.  She was notified by Cloyde Reams RN.         Prior Anticoagulation Instructions: INR 2.2  Continue taking 1 tablet on Sunday and thursdsay and 1.5 tablets all other days.  Return to clinic in 2 weeks.    Current Anticoagulation Instructions: INR 2.2  Continue on same dosage 1.5 tablets daily except 1 tablet on Sundays and  Thursdays.  Recheck in 3 weeks.

## 2010-06-13 NOTE — Cardiovascular Report (Signed)
Summary: Office Visit Remote   Office Visit Remote   Imported By: Roderic Ovens 01/31/2010 13:58:43  _____________________________________________________________________  External Attachment:    Type:   Image     Comment:   External Document

## 2010-06-13 NOTE — Letter (Signed)
Summary: Remote Device Check  [facility], Main Office  [address]   [place], [place] [address]   Phone: [phone]  Fax: [phone]     January 30, 2010 MRN: [id number]   [facility] [place] [address] [place], [place]  [address]   Dear Ms. Luby,   Your remote transmission was recieved and reviewed by your physician.  All diagnostics were within normal limits for you.  __X___Your next transmission is scheduled for:   04-27-2010.  Please transmit at any time this day.  If you have a wireless device your transmission will be sent automatically.  Sincerely,  Vella Kohler

## 2010-06-13 NOTE — Assessment & Plan Note (Signed)
Summary: 1 month follow up/mhf   Vital Signs:  Patient profile:   70 year old female Height:      64 inches Weight:      252.50 pounds BMI:     43.50 O2 Sat:      93 % on Room air Temp:     97.7 degrees F oral Pulse rate:   76 / minute Pulse rhythm:   irregular Resp:     22 per minute BP sitting:   110 / 68  (left arm) Cuff size:   large  Vitals Entered By: Glendell Docker CMA (October 17, 2009 11:04 AM)  O2 Flow:  Room air CC: Rm 2- 1 Month Follow up Is Patient Diabetic? No Pain Assessment Patient in pain? no      Comments blood sugar this am 300, c/ o electrical shock sensation  in feet for the past 2 nights, swelling in right leg below the knee since the change in furosemide,   Primary Care Provider:  DThomos Lemons DO  CC:  Rm 2- 1 Month Follow up.  History of Present Illness: 70 y/o for hosp follow up breathing better since hospitalization for diuresis she c/o intermittent leg pains.   feels like shock sensation  Preventive Screening-Counseling & Management  Alcohol-Tobacco     Smoking Status: current  Allergies: 1)  ! Sulfa  Past History:  Past Medical History: ICD - IN SITU (ICD-V45.02) LBBB (ICD-426.3)  CAROTID BRUIT (ICD-785.9)     DYSLIPIDEMIA (ICD-272.4)  HYPERTENSION (ICD-401.9) CONGESTIVE HEART FAILURE (ICD-428.0)  CVA (ICD-434.91) BACK PAIN, CHRONIC (ICD-724.5)   SLEEP APNEA (ICD-780.57) DIABETIC  RETINOPATHY (ICD-250.50)  CARDIOMYOPATHY, ISCHEMIC (ICD-414.8) COUGH (ICD-786.2) UTI'S, RECURRENT (ICD-599.0) ACUTE CYSTITIS (ICD-595.0) RHINOSINUSITIS, ACUTE (ICD-461.8) DYSURIA (ICD-788.1) UNSPECIFIED HYPOTHYROIDISM (ICD-244.9) BACKGROUND DIABETIC RETINOPATHY (ICD-362.01) CANDIDIASIS OF VULVA AND VAGINA (ICD-112.1) DIABETES MELLITUS, TYPE II, UNCONTROLLED (ICD-250.02)  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 Truett Perna  Past Surgical History: Hysterectomy - 1982     Gallbladder -- 1970  CABG -- 2003           Family History: Family History of Cancer:  Family History of Heart problems             Social History: Married   Alcohol use-no   Tobacco use-no      Retired      Regular Exercise - no  Drug Use - no   Smoking Status:  current  Physical Exam  General:  alert and overweight-appearing.   Lungs:  normal respiratory effort, normal breath sounds, no crackles, and no wheezes.   Heart:  normal rate, regular rhythm, and no gallop.   Extremities:  trace left pedal edema and trace right pedal edema.     Impression & Recommendations:  Problem # 1:  CONGESTIVE HEART FAILURE (ICD-428.0) Assessment Improved she appears euvolemic.  continue 80 mg of lasix.  continue ACE and B Blocker  Her updated medication list for this problem includes:    Enalapril Maleate 10 Mg Tabs (Enalapril maleate) ..... By mouth once daily    Furosemide 80 Mg Tabs (Furosemide) .Marland Kitchen... Take 1 tablet by mouth once a day    Coreg 25 Mg Tabs (Carvedilol) .Marland Kitchen... 2 by mouth two times a day    Low-dose Aspirin 81 Mg Tabs (Aspirin) ..... One by mouth once daily    Warfarin Sodium 5 Mg Tabs (Warfarin sodium) .Marland Kitchen... 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  use gabapentin as directed.  Her updated medication list for this problem includes:    Enalapril Maleate 10 Mg Tabs (Enalapril maleate) ..... By mouth once daily    Humulin R 100 Unit/ml Soln (Insulin regular human) .Marland KitchenMarland KitchenMarland KitchenMarland Kitchen 15-25 units before meals    Novolin N 100 Unit/ml Susp (Insulin isophane human) ..... Inject 40-50 units in am and 20-30 units at night    Low-dose Aspirin 81 Mg Tabs (Aspirin) ..... One by mouth once daily  Complete Medication List: 1)  Crestor 10 Mg Tabs (Rosuvastatin calcium) .... By mouth once daily 2)  Enalapril Maleate 10 Mg Tabs (Enalapril maleate) .... By mouth once daily 3)  Furosemide 80 Mg Tabs  (Furosemide) .... Take 1 tablet by mouth once a day 4)  Coreg 25 Mg Tabs (Carvedilol) .... 2 by mouth two times a day 5)  Amlodipine Besylate 5 Mg Tabs (Amlodipine besylate) .... One by mouth once daily 6)  Humulin R 100 Unit/ml Soln (Insulin regular human) .Marland KitchenMarland KitchenMarland Kitchen 15-25 units before meals 7)  Novolin N 100 Unit/ml Susp (Insulin isophane human) .... Inject 40-50 units in am and 20-30 units at night 8)  Low-dose Aspirin 81 Mg Tabs (Aspirin) .... One by mouth once daily 9)  Bd Insulin Syringe Ultrafine 30g X 1/2" 1 Ml Misc (Insulin syringe-needle u-100) .... Use for insulin injection four times a day  (250.02 dx code) 10)  Fish Oil 1200 Mg Caps (Omega-3 fatty acids) .... Take 2 capsules by mouth two times a day 11)  Vitamin D3 1000 Unit Caps (Cholecalciferol) .... Take 1 capsule by mouth once a day 12)  Multivitamins Tabs (Multiple vitamin) .... Take 1 tablet by mouth once a day 13)  Lorazepam 0.5 Mg Tabs (Lorazepam) .... Take 1 tab by mouth at bedtime 14)  Warfarin Sodium 5 Mg Tabs (Warfarin sodium) .... 7.5 mg by mouth 6 days a week and 5mg  by mouth one day per week 15)  Oxygen 3.5 L By [place]  .... Pt needs home oxygen and portable system. conserving device is okay. used at bedtime 16)  Calcium-vitamin D 600-200 Mg-unit Tabs (Calcium-vitamin d) .... Take 1 tablet by mouth once a day 17)  Gabapentin 100 Mg Caps (Gabapentin) .... One by mouth bid 18)  Cymbalta 30 Mg Cpep (Duloxetine hcl) .... Take 1 tablet by mouth once a day for 2 weeks 19)  Cymbalta 30 Mg Cpep (Duloxetine hcl) .... Take 1 tablet by mouth once a day after initial 2 weeks of 30mg .  Patient Instructions: 1)  Please schedule a follow-up appointment in 3 months 2)  BMP prior to visit, ICD-9: 401.9 3)  HbgA1C prior to visit, ICD-9: 250.02 4)  Please return for lab work one (1) week before your next appointment.  Prescriptions: GABAPENTIN 100 MG CAPS (GABAPENTIN) one by mouth bid  #60 x 5   Entered and Authorized by:   [name]    Signed by:   [name] on 10/17/2009   Method used:   Electronically to        [id number] (retail)       [address]       [place], [place]  [address]       Ph: [phone]       Fax: [phone]   RxID:  [id number] NOVOLIN N 100 UNIT/ML SUSP (INSULIN ISOPHANE HUMAN) inject 40-50 units in AM and 20-30 units at night  #1 month x 3   Entered and Authorized by:   [name]   Signed by:   [name] on 10/17/2009   Method used:   Electronically to        [facility] [address] [id number] ([facility]       [address]       [place], [place]  [address]       Ph: [phone]       Fax: [phone]   RxID:   [id number]   Current Allergies (reviewed today): ! SULFA

## 2010-06-13 NOTE — Letter (Signed)
[facility]   Imported By: Lanelle Bal 06/13/2009 13:08:25  _____________________________________________________________________  External Attachment:    Type:   Image     Comment:   External Document

## 2010-06-13 NOTE — Medication Information (Signed)
Summary: [email or web address]  Anticoagulant Therapy  Managed by: Cloyde Reams, RN, BSN Referring MD: Lewayne Bunting PCP: Dondra Spry DO Supervising MD: Johney Frame MD, Fayrene Fearing Indication 1: Left Ventricular Dysfunction (ICD-LVD) Indication 2: Implant date 08/02/05--global 90 days (ICD-**) Lab Used: [facility] Site: [facility] INR POC 1.1 INR RANGE 2 - 3  Dietary changes: no    Health status changes: no    Bleeding/hemorrhagic complications: no    Recent/future hospitalizations: no    Any changes in medication regimen? no    Recent/future dental: no  Any missed doses?: no       Is patient compliant with meds? yes      Comments: Started back on coumadin 06/11/09 at 5mg  once daily/7.5mg  MWF.    Allergies (verified): 1)  ! Sulfa  Anticoagulation Management History:      The patient is taking warfarin and comes in today for a routine follow up visit.  Positive risk factors for bleeding include an age of 60 years or older, history of CVA/TIA, and presence of serious comorbidities.  The bleeding index is 'high risk'.  Positive CHADS2 values include History of CHF, History of HTN, History of Diabetes, and Prior Stroke/CVA/TIA.  Negative CHADS2 values include Age > 30 years old.  The start date was 09/20/2001.  Anticoagulation responsible provider: Loan Oguin MD, Fayrene Fearing.  INR POC: 1.1.  Cuvette Lot#: [id number].  Exp: 08/2010.    Anticoagulation Management Assessment/Plan:      The patient's current anticoagulation dose is Warfarin sodium 5 mg tabs: take as directed per coumadin clinic.  The target INR is 2.0-3.0.  The next INR is due 06/24/2009.  Anticoagulation instructions were given to patient.  Results were reviewed/authorized by Cloyde Reams, RN, BSN.  She was notified by Cloyde Reams RN.         Prior Anticoagulation Instructions: INR 2.0 Change dose to 5mg s everyday except 7.5mg s on Mondays, Wednesdays and Fridays.   Current Anticoagulation Instructions: INR 1.1  Take 7.5mg  on this Saturday  and Sunday then resume same dosage 5mg  daily except 7.5mg  on Mondays, Wednesdays, and Friday.  Recheck in 1 week.

## 2010-06-13 NOTE — Assessment & Plan Note (Signed)
Summary: 3 month follow up/mhf   Vital Signs:  Patient profile:   70 year old female Weight:      258.50 pounds BMI:     44.53 O2 Sat:      91 % on Room air Temp:     98.1 degrees F oral Pulse rate:   77 / minute Pulse rhythm:   regular Resp:     20 per minute BP sitting:   116 / 50  (right arm) Cuff size:   regular  Vitals Entered By: Glendell Docker CMA (June 29, 2009 10:23 AM)  O2 Flow:  Room air  Primary Care Provider:  [name]  CC:  3 Month Follow up .  History of Present Illness: 3 Month Follow up  70 year old white female with history of high-grade non-Hodgkin's lymphoma, type 2 diabetes, diastolic heart failure for followup Since previous visit patient has completed additional chemotherapy. She is using prednisone after chemotherapy which is raising her blood sugars. However, she denies severe elevation in blood sugars. Oral intake has been less due to to symptoms of nausea.  she has one more round of chemotherapy left overall patient has been in good spirits. she remains very positive  Prednisone has caused some weight gain.  Diastolic dysfunction-patient taking Lasix regularly  Allergies: 1)  ! Sulfa  Past History:  Past Medical History: ICD - IN SITU (ICD-V45.02) LBBB (ICD-426.3)  CAROTID BRUIT (ICD-785.9)   DYSLIPIDEMIA (ICD-272.4)  HYPERTENSION (ICD-401.9) CONGESTIVE HEART FAILURE (ICD-428.0) CVA (ICD-434.91) BACK PAIN, CHRONIC (ICD-724.5)   SLEEP APNEA (ICD-780.57) DIABETIC  RETINOPATHY (ICD-250.50)  CARDIOMYOPATHY, ISCHEMIC (ICD-414.8) COUGH (ICD-786.2) UTI'S, RECURRENT (ICD-599.0) ACUTE CYSTITIS (ICD-595.0) RHINOSINUSITIS, ACUTE (ICD-461.8) DYSURIA (ICD-788.1) UNSPECIFIED HYPOTHYROIDISM (ICD-244.9) BACKGROUND DIABETIC RETINOPATHY (ICD-362.01) CANDIDIASIS OF VULVA AND VAGINA (ICD-112.1) DIABETES MELLITUS, TYPE II, UNCONTROLLED (ICD-250.02) 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 Truett Perna  Past Surgical  History: Hysterectomy - 1982     Gallbladder -- 1970  CABG -- 2003      Family History: Family History of Cancer:  Family History of Heart problems          Social History: Married   Alcohol use-no   Tobacco use-no     Retired     Regular Exercise - no Drug Use - no     Physical Exam  General:  alert and overweight-appearing.   Lungs:  normal respiratory effort, normal breath sounds, no crackles, and no wheezes.   Heart:  normal rate, regular rhythm, and no gallop.   Abdomen:  obese, non-tender.  unable to palpate left groin mass Extremities:  No lower extremity edema  Neurologic:  cranial nerves II-XII intact and gait normal.   Psych:  normally interactive, good eye contact, and not depressed appearing.     Impression & Recommendations:  Problem # 1:  NON-HODGKIN'S LYMPHOMA (ICD-202.80) followed by Dr. Truett Perna.  she received CHOP x 5.  Dr. Truett Perna to decide about radiation.   Bone marrow test was negative. pt has been getting prednisone after chemo her blood sugar have not been out of control. topaside causes GI upset.  She will f/u with ortho re:  left femur weakness.   Pt may be higher risk for pathologic fracture.  Mgt as per ortho  Problem # 2:  DIABETES MELLITUS, TYPE II, UNCONTROLLED (ICD-250.02) blood sugars have not been over 400 considering prednisone use.  she is having occ self managed hypoglycemia.  pt advised to decrease NPH dose in half when she  has has poor p.o. intake due to chemotherapy.  Her updated medication list for this problem includes:    Enalapril Maleate 10 Mg Tabs (Enalapril maleate) ..... By mouth once daily    Humulin R 100 Unit/ml Soln (Insulin regular human) .Marland Kitchen... 20-30 units sliding scale before meals    Novolin N 100 Unit/ml Susp (Insulin isophane human) ..... Inject 60 units in am and 40 units at night    Low-dose Aspirin 81 Mg Tabs (Aspirin) ..... One by mouth once daily  Problem # 3:  HYPERTENSION (ICD-401.9) stable.  oncologist  has been monitoring BMET.  she reports kidney function has been stable. Her updated medication list for this problem includes:    Enalapril Maleate 10 Mg Tabs (Enalapril maleate) ..... By mouth once daily    Furosemide 80 Mg Tabs (Furosemide) ..... By mouth once daily    Coreg 25 Mg Tabs (Carvedilol) .Marland Kitchen... 2 by mouth two times a day    Amlodipine Besylate 5 Mg Tabs (Amlodipine besylate) ..... One by mouth once daily  BP today: 116/50 Prior BP: 125/75 (06/21/2009)  Labs Reviewed: K+: 4.8 (12/14/2008) Creat: : 1.5 (12/14/2008)   Chol: 275 (12/14/2008)   HDL: 42.70 (12/14/2008)   LDL: DEL (06/15/2008)   TG: 669.0 (12/14/2008)  Complete Medication List: 1)  Crestor 10 Mg Tabs (Rosuvastatin calcium) .... By mouth once daily 2)  Enalapril Maleate 10 Mg Tabs (Enalapril maleate) .... By mouth once daily 3)  Citalopram Hydrobromide 20 Mg Tabs (Citalopram hydrobromide) .... By mouth once daily 4)  Furosemide 80 Mg Tabs (Furosemide) .... By mouth once daily 5)  Coreg 25 Mg Tabs (Carvedilol) .... 2 by mouth two times a day 6)  Amlodipine Besylate 5 Mg Tabs (Amlodipine besylate) .... One by mouth once daily 7)  Humulin R 100 Unit/ml Soln (Insulin regular human) .... 20-30 units sliding scale before meals 8)  Novolin N 100 Unit/ml Susp (Insulin isophane human) .... Inject 60 units in am and 40 units at night 9)  Low-dose Aspirin 81 Mg Tabs (Aspirin) .... One by mouth once daily 10)  Bd Insulin Syringe Ultrafine 30g X 1/2" 1 Ml Misc (Insulin syringe-needle u-100) .... Use for insulin injection four times a day  (250.02 dx code) 11)  Fish Oil 1200 Mg Caps (Omega-3 fatty acids) .... Take 2 capsules by mouth two times a day 12)  Vitamin D3 1000 Unit Caps (Cholecalciferol) .... Take 1 capsule by mouth once a day 13)  Multivitamins Tabs (Multiple vitamin) .... Take 1 tablet by mouth once a day 14)  Lorazepam 0.5 Mg Tabs (Lorazepam) .... Take 1 tab by mouth at bedtime 15)  Warfarin Sodium 5 Mg Tabs (Warfarin  sodium) .... Take as directed per coumadin clinic 16)  Prednisone 20 Mg Tabs (Prednisone) .... Use as directed 17)  Etoposide 50 Mg Caps (Etoposide) .... Use as directed 18)  Lorazepam 1 Mg Tabs (Lorazepam) .... Take 1/2-11 tab by mouth at bedtime as directed. 19)  Oxygen 2 L By [place]  .... Pt needs home oxygen and portable system. conserving device is okay.  Patient Instructions: 1)  Please schedule a follow-up appointment in 3 months. 2)  BMP prior to visit, ICD-9: 401.9 3)  HbgA1C prior to visit, ICD-9: 250.02 4)  Urine Microalbumin prior to visit, ICD-9: 250.02 5)  Please return for lab work one (1) week before your next appointment.   Current Allergies (reviewed today): ! SULFA

## 2010-06-13 NOTE — Assessment & Plan Note (Signed)
Summary: 3 month follow up/mhf   Vital Signs:  Patient profile:   70 year old female Height:      64 inches Weight:      255.75 pounds BMI:     44.06 O2 Sat:      93 % on Room air Temp:     98.0 degrees F oral Pulse rate:   77 / minute Pulse rhythm:   regular Resp:     20 per minute BP sitting:   114 / 60  (left arm) Cuff size:   large  Vitals Entered By: Glendell Docker CMA (January 17, 2010 10:55 AM)  O2 Flow:  Room air CC: 3 Month Follow up  Is Patient Diabetic? Yes Did you bring your meter with you today? No Pain Assessment Patient in pain? no      Comments low blood sugar 145 high 275 avg  150-160, elevation is usually in the afternoon. cpap machine supplies from [facility]   Primary Care Provider:  [name]  CC:  3 Month Follow up .  History of Present Illness: 70 y/o white female for f/u started PT at home - exercises involved - standing up her toes and hip flexion it aggravated her back pain  planning to ICD / pacemaker replaced  CHF - new diuretic working well.  she is weighing herself daily  Preventive Screening-Counseling & Management  Alcohol-Tobacco     Smoking Status: never  Allergies: 1)  ! Sulfa  Past History:  Past Medical History: ICD - IN SITU (ICD-V45.02) LBBB (ICD-426.3)  CAROTID BRUIT (ICD-785.9)     DYSLIPIDEMIA (ICD-272.4)   HYPERTENSION (ICD-401.9)  CONGESTIVE HEART FAILURE (ICD-428.0)  CVA (ICD-434.91) BACK PAIN, CHRONIC (ICD-724.5)   SLEEP APNEA (ICD-780.57) DIABETIC  RETINOPATHY (ICD-250.50)  CARDIOMYOPATHY, ISCHEMIC (ICD-414.8) COUGH (ICD-786.2) UTI'S, RECURRENT (ICD-599.0) ACUTE CYSTITIS (ICD-595.0) RHINOSINUSITIS, ACUTE (ICD-461.8) DYSURIA (ICD-788.1) UNSPECIFIED HYPOTHYROIDISM (ICD-244.9) BACKGROUND DIABETIC RETINOPATHY (ICD-362.01) CANDIDIASIS OF VULVA AND VAGINA (ICD-112.1) DIABETES MELLITUS, TYPE II, UNCONTROLLED (ICD-250.02)  CRI  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  Truett Perna  Past Surgical History: Hysterectomy - 1982     Gallbladder -- 1970  CABG -- 2003            Family History: Family History of Cancer:  Family History of Heart problems               Physical Exam  General:  alert, well-developed, and well-nourished.   Lungs:  normal respiratory effort, normal breath sounds, no crackles, and no wheezes.   Heart:  normal rate, regular rhythm, and no gallop.   Extremities:  No lower extremity edema   Impression & Recommendations:  Problem # 1:  CONGESTIVE HEART FAILURE (ICD-428.0) Assessment Improved good response to torsemide  Her updated medication list for this problem includes:    Enalapril Maleate 10 Mg Tabs (Enalapril maleate) ..... By mouth once daily    Coreg 25 Mg Tabs (Carvedilol) .Marland Kitchen... 2 by mouth two times a day    Low-dose Aspirin 81 Mg Tabs (Aspirin) ..... One by mouth once daily    Warfarin Sodium 5 Mg Tabs (Warfarin sodium) .Marland Kitchen... 7.5 mg by mouth 6 days a week and 5mg  by mouth one day per week    Torsemide 20 Mg Tabs (Torsemide) ..... One and 1/2 tab by mouth two times a day  Echocardiogram:  -  Left ventricular size was at the upper limits of normal. Overall  left ventricular systolic function was normal. Left         ventricular ejection fraction was estimated , range being 55         % to 60 %. Although no diagnostic left ventricular regional         wall motion abnormality was identified, this possibility         cannot be completely excluded on the basis of this study.         Left ventricular wall thickness was mildly increased. There         was mild paradoxical motion of the interventricular septum,         consistent with a conduction abnormality or paced rhythm.   -  There was normal aortic valve leaflet excursion. The mean         transaortic valve gradient was 9 mmHg.   -  There was mild mitral annular calcification. Also calcified,         redundant chordal structure noted in left ventricle.   -   The left atrium was mild to moderately dilated.   -  There was the appearance of a catheter or pacing wire in the         right ventricle.   -  The estimated peak pulmonary artery systolic pressure was mildly         increased. (08/05/2006)  Problem # 2:  HYPERTENSION (ICD-401.9) Assessment: Unchanged  Her updated medication list for this problem includes:    Enalapril Maleate 10 Mg Tabs (Enalapril maleate) ..... By mouth once daily    Coreg 25 Mg Tabs (Carvedilol) .Marland Kitchen... 2 by mouth two times a day    Amlodipine Besylate 5 Mg Tabs (Amlodipine besylate) ..... One by mouth once daily    Torsemide 20 Mg Tabs (Torsemide) ..... One and 1/2 tab by mouth two times a day  BP today: 114/60 Prior BP: 108/50 (12/28/2009)  Labs Reviewed: K+: 5.3 (12/28/2009) Creat: : 1.29 (12/28/2009)   Chol: 275 (12/14/2008)   HDL: 42.70 (12/14/2008)   LDL: DEL (06/15/2008)   TG: 669.0 (12/14/2008)  Complete Medication List: 1)  Crestor 10 Mg Tabs (Rosuvastatin calcium) .... By mouth once daily 2)  Enalapril Maleate 10 Mg Tabs (Enalapril maleate) .... By mouth once daily 3)  Coreg 25 Mg Tabs (Carvedilol) .... 2 by mouth two times a day 4)  Amlodipine Besylate 5 Mg Tabs (Amlodipine besylate) .... One by mouth once daily 5)  Humulin R 100 Unit/ml Soln (Insulin regular human) .... Inject subcutaneously 20 -30  units three times a day before meals 6)  Novolin N 100 Unit/ml Susp (Insulin isophane human) .... Inject subcutaneously 60 units  every morning  and 30 units every evening 7)  Low-dose Aspirin 81 Mg Tabs (Aspirin) .... One by mouth once daily 8)  Bd Insulin Syringe Ultrafine 30g X 1/2" 1 Ml Misc (Insulin syringe-needle u-100) .... Use for insulin injection four times a day  (250.02 dx code) 9)  Fish Oil 1200 Mg Caps (Omega-3 fatty acids) .... Take 2 capsules by mouth two times a day 10)  Vitamin D3 1000 Unit Caps (Cholecalciferol) .... Take 1 capsule by mouth once a day 11)  Multivitamins Tabs (Multiple  vitamin) .... Take 1 tablet by mouth once a day 12)  Lorazepam 0.5 Mg Tabs (Lorazepam) .... Take 1 tab by mouth at bedtime 13)  Warfarin Sodium 5 Mg Tabs (Warfarin sodium) .... 7.5 mg by mouth 6 days a week and 5mg  by  mouth one day per week 14)  Oxygen 3.5 L By [place]  .... Pt needs home oxygen and portable system. conserving device is okay. used at bedtime 15)  Calcium-vitamin D 600-200 Mg-unit Tabs (Calcium-vitamin d) .... Take 1 tablet by mouth once a day 16)  Neurontin 300 Mg Caps (Gabapentin) .... One by mouth three times a day prn 17)  Citalopram Hydrobromide 20 Mg Tabs (Citalopram hydrobromide) .... Take 1 tablet by mouth once a day 18)  Torsemide 20 Mg Tabs (Torsemide) .... One and 1/2 tab by mouth two times a day 19)  Hydrocodone-acetaminophen 5-500 Mg Tabs (Hydrocodone-acetaminophen) .... One by mouth two times a day prn  Other Orders: Influenza Vaccine MCR (81191) Flu Vaccine 41yrs + MEDICARE PATIENTS (Y7829)  Patient Instructions: 1)  Please schedule a follow-up appointment in 2 months. 2)  BMP prior to visit, ICD-9: 401.9 3)  HbgA1C prior to visit, ICD-9: 250.00 4)  Hepatic Panel prior to visit, ICD-9:  272.4 5)  Lipid Panel prior to visit, ICD-9: 272.4 6)  TSH prior to visit, ICD-9: 272.4 7)  Please return for lab work one (1) week before your next appointment.  Prescriptions: HYDROCODONE-ACETAMINOPHEN 5-500 MG TABS (HYDROCODONE-ACETAMINOPHEN) one by mouth two times a day prn  #30 x 0   Entered and Authorized by:   [name]   Signed by:   [name] on 01/17/2010   Method used:   Print then Give to Patient   RxID:   [id number] TORSEMIDE 20 MG TABS (TORSEMIDE) one and 1/2 tab by mouth two times a day  #90 x 3   Entered and Authorized by:   [name]   Signed by:   [name] on 01/17/2010   Method used:   Electronically to        [id number]       [address]       [place], [place]  [address]        Ph: [phone]       Fax: [phone]   RxID:   [id number] COREG 25 MG  TABS (CARVEDILOL) 2 by mouth two times a day  #360 x 3   Entered and Authorized by:   [name]   Signed by:   [name] on 01/17/2010   Method used:   [facility]        [facility] [facility] [address] [id number]       [address]       [place], [place]  [address]       Ph: [phone]       Fax: [phone]   RxID:   [id number] ENALAPRIL MALEATE 10 MG  TABS (ENALAPRIL MALEATE) by mouth once daily  #90 Each x 2   Entered and Authorized by:   [name]   Signed by:   [name] on 01/17/2010   Method used:   [facility]ronically to        [id number]       [address]       [place], [place]  [address]       Ph: [phone]       Fax: [phone]   RxID:   [id number]  CRESTOR 10 MG  TABS (ROSUVASTATIN CALCIUM) by mouth once daily  #30 x 5   Entered and Authorized by:   [name]   Signed by:   [name] on 01/17/2010   Method used:   [facility] to        [id number]       [address]       [place], [place]  [address]       Ph: [phone]       Fax: [phone]   RxID:   [id number]   Current Allergies (reviewed today): ! SULFA   Immunizations Administered:  Influenza Vaccine # 1:    Vaccine Type: Fluvax MCR    Site: left deltoid    Mfr: GlaxoSmithKline    Dose: 0.5 ml    Route: IM    Given by: Glendell Docker CMA    Exp. Date: 11/11/2010    Lot #: [id number]    VIS given: 12/06/09 version given January 17, 2010.  Flu Vaccine Consent Questions:    Do you have a history of severe allergic reactions to this vaccine? no    Any prior history of allergic reactions to egg and/or gelatin? no    Do you have a sensitivity to the preservative Thimersol? no    Do you have a past history of Guillan-Barre Syndrome? no     Do you currently have an acute febrile illness? no    Have you ever had a severe reaction to latex? no    Vaccine information given and explained to patient? yes    Are you currently pregnant? no

## 2010-06-15 NOTE — Medication Information (Signed)
Summary: rov/tm  Anticoagulant Therapy  Managed by: Bethena Midget, RN, BSN Referring MD: Lewayne Bunting PCP: Dondra Spry DO Supervising MD: Tenny Craw MD, Gunnar Fusi Indication 1: Left Ventricular Dysfunction (ICD-LVD) Indication 2: Implant date 08/02/05--global 90 days (ICD-**) Lab Used: [facility] Site: [facility] INR POC 2.8 INR RANGE 2 - 3  Dietary changes: no    Health status changes: no    Bleeding/hemorrhagic complications: no    Recent/future hospitalizations: no    Any changes in medication regimen? no    Recent/future dental: no  Any missed doses?: no       Is patient compliant with meds? yes       Allergies: 1)  ! Sulfa  Anticoagulation Management History:      The patient is taking warfarin and comes in today for a routine follow up visit.  Positive risk factors for bleeding include an age of 51 years or older, history of CVA/TIA, and presence of serious comorbidities.  The bleeding index is 'high risk'.  Positive CHADS2 values include History of CHF, History of HTN, History of Diabetes, and Prior Stroke/CVA/TIA.  Negative CHADS2 values include Age > 59 years old.  The start date was 09/20/2001.  Her last INR was 2.5.  Anticoagulation responsible provider: Tenny Craw MD, Gunnar Fusi.  INR POC: 2.8.  Cuvette Lot#: [id number].  Exp: 12/2010.    Anticoagulation Management Assessment/Plan:      The patient's current anticoagulation dose is Warfarin sodium 5 mg tabs: 7.5 mg by mouth 6 days a week and 5mg  by mouth one day per week.  The target INR is 2.0-3.0.  The next INR is due 06/16/2010.  Anticoagulation instructions were given to patient.  Results were reviewed/authorized by Bethena Midget, RN, BSN.  She was notified by Bethena Midget, RN, BSN.         Prior Anticoagulation Instructions: INR 2.8 Continue 7.5mg  everyday. Recheck in 4 weeks.   Current Anticoagulation Instructions: INR 2.8 Continue 7.5mg s daily. Recheck in 4 weeks.

## 2010-06-15 NOTE — Assessment & Plan Note (Signed)
Summary: 2 month fu/dt   Vital Signs:  Patient profile:   70 year old female Height:      64 inches Weight:      262 pounds BMI:     45.13 O2 Sat:      91 % on Room air Temp:     97.9 degrees F oral Pulse rate:   77 / minute Pulse rhythm:   regular Resp:     18 per minute BP sitting:   98 / 60  (left arm) Cuff size:   large  Vitals Entered By: Glendell Docker CMA (March 21, 2010 11:11 AM)  O2 Flow:  Room air CC: 2 month follow up, Type 2 diabetes mellitus follow-up Is Patient Diabetic? Yes Did you bring your meter with you today? No Pain Assessment Patient in pain? no      Comments low blood sugar 140 high 220 avg 165-175, questions about CPAP machine, needs a rx for machine and the supplies that goes with it.   Primary Care Provider:  Dondra Spry DO  CC:  2 month follow up and Type 2 diabetes mellitus follow-up.  History of Present Illness:  Type 2 Diabetes Mellitus Follow-Up      This is a 70 year old woman who presents for Type 2 diabetes mellitus follow-up.  The patient reports self managed hypoglycemia and weight gain.  The patient denies the following symptoms: chest pain.  Since the last visit the patient reports compliance with medications and monitoring blood glucose.    Preventive Screening-Counseling & Management  Alcohol-Tobacco     Smoking Status: never  Allergies: 1)  ! Sulfa  Past History:  Past Medical History: ICD - IN SITU (ICD-V45.02) LBBB (ICD-426.3)  CAROTID BRUIT (ICD-785.9)      DYSLIPIDEMIA (ICD-272.4)   HYPERTENSION (ICD-401.9)  CONGESTIVE HEART FAILURE (ICD-428.0)  CVA (ICD-434.91) BACK PAIN, CHRONIC (ICD-724.5)   SLEEP APNEA (ICD-780.57) DIABETIC  RETINOPATHY (ICD-250.50)  CARDIOMYOPATHY, ISCHEMIC (ICD-414.8) COUGH (ICD-786.2) UTI'S, RECURRENT (ICD-599.0) ACUTE CYSTITIS (ICD-595.0) RHINOSINUSITIS, ACUTE (ICD-461.8) DYSURIA (ICD-788.1) UNSPECIFIED HYPOTHYROIDISM (ICD-244.9) BACKGROUND DIABETIC RETINOPATHY  (ICD-362.01) CANDIDIASIS OF VULVA AND VAGINA (ICD-112.1) DIABETES MELLITUS, TYPE II, UNCONTROLLED (ICD-250.02)  CRI  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 Truett Perna  Past Surgical History: Hysterectomy - 1982     Gallbladder -- 1970  CABG -- 2003             Family History: Family History of Cancer:  Family History of Heart problems                Social History: Married   Alcohol use-no   Tobacco use-no      Retired       Regular Exercise - no  Drug Use - no      Review of Systems       The patient complains of weight gain.  The patient denies chest pain.    Physical Exam  General:  alert, well-developed, and well-nourished.   Head:  normocephalic and atraumatic.   Mouth:  pharynx pink and moist.   Neck:  No deformities, masses, or tenderness noted. Lungs:  normal respiratory effort, normal breath sounds, no crackles, and no wheezes.   Heart:  normal rate, regular rhythm, and no gallop.   Extremities:  No lower extremity edema Neurologic:  cranial nerves II-XII intact and gait normal.   Psych:  normally interactive, good eye contact, not anxious appearing, and not depressed appearing.     Impression &  Recommendations:  Problem # 1:  DIABETES MELLITUS, TYPE II, UNCONTROLLED (ICD-250.02) pt still having intermittent low blood sugars.  reduce NPH dose  Her updated medication list for this problem includes:    Enalapril Maleate 10 Mg Tabs (Enalapril maleate) ..... By mouth once daily    Humulin R 100 Unit/ml Soln (Insulin regular human) ..... Inject subcutaneously 20 -30  units three times a day before meals    Novolin N 100 Unit/ml Susp (Insulin isophane human) ..... Inject subcutaneously 40 units  every morning  and 30 units every evening    Low-dose Aspirin 81 Mg Tabs (Aspirin) ..... One by mouth once daily  Problem # 2:  SLEEP APNEA (ICD-780.57)  Orders: Misc. Referral (Misc. Ref) Misc. Referral (Misc. Ref)  Problem # 3:  CONGESTIVE  HEART FAILURE (ICD-428.0) Assessment: Deteriorated  Her updated medication list for this problem includes:    Enalapril Maleate 10 Mg Tabs (Enalapril maleate) ..... By mouth once daily    Coreg 25 Mg Tabs (Carvedilol) .Marland Kitchen... 2 by mouth two times a day    Low-dose Aspirin 81 Mg Tabs (Aspirin) ..... One by mouth once daily    Warfarin Sodium 5 Mg Tabs (Warfarin sodium) .Marland Kitchen... 7.5 mg by mouth 6 days a week and 5mg  by mouth one day per week    Torsemide 20 Mg Tabs (Torsemide) ..... One and 1/2 tab by mouth two times a day  Complete Medication List: 1)  Crestor 10 Mg Tabs (Rosuvastatin calcium) .... By mouth once daily 2)  Enalapril Maleate 10 Mg Tabs (Enalapril maleate) .... By mouth once daily 3)  Coreg 25 Mg Tabs (Carvedilol) .... 2 by mouth two times a day 4)  Amlodipine Besylate 5 Mg Tabs (Amlodipine besylate) .... One by mouth once daily 5)  Humulin R 100 Unit/ml Soln (Insulin regular human) .... Inject subcutaneously 20 -30  units three times a day before meals 6)  Novolin N 100 Unit/ml Susp (Insulin isophane human) .... Inject subcutaneously 40 units  every morning  and 30 units every evening 7)  Low-dose Aspirin 81 Mg Tabs (Aspirin) .... One by mouth once daily 8)  Bd Insulin Syringe Ultrafine 30g X 1/2" 1 Ml Misc (Insulin syringe-needle u-100) .... Use for insulin injection four times a day  (250.02 dx code) 9)  Fish Oil 1200 Mg Caps (Omega-3 fatty acids) .... Take 2 capsules by mouth two times a day 10)  Vitamin D3 1000 Unit Caps (Cholecalciferol) .... Take 1 capsule by mouth once a day 11)  Multivitamins Tabs (Multiple vitamin) .... Take 1 tablet by mouth once a day 12)  Lorazepam 0.5 Mg Tabs (Lorazepam) .... Take 1 tab by mouth at bedtime 13)  Warfarin Sodium 5 Mg Tabs (Warfarin sodium) .... 7.5 mg by mouth 6 days a week and 5mg  by mouth one day per week 14)  Oxygen 3.5 L By [place]  .... Pt needs home oxygen and portable system. conserving device is okay. used at bedtime 15)   Calcium-vitamin D 600-200 Mg-unit Tabs (Calcium-vitamin d) .... Take 1 tablet by mouth once a day 16)  Neurontin 300 Mg Caps (Gabapentin) .... One by mouth three times a day prn 17)  Citalopram Hydrobromide 20 Mg Tabs (Citalopram hydrobromide) .... Take 1 tablet by mouth once a day 18)  Torsemide 20 Mg Tabs (Torsemide) .... One and 1/2 tab by mouth two times a day 19)  Hydrocodone-acetaminophen 5-500 Mg Tabs (Hydrocodone-acetaminophen) .... One by mouth two times a day prn 20)  Duragesic-25 25 Mcg/hr  Pt72 (Fentanyl) .... Apply new  pacth every 3 days as directed 21)  Levothyroxine Sodium 25 Mcg Tabs (Levothyroxine sodium) .... One by mouth once daily  Patient Instructions: 1)  Please schedule a follow-up appointment in 2 months. 2)  TSH prior to visit, ICD-9:  244.90 3)  BMP prior to visit, ICD-9: 401.9 4)  BNP:  428.0 5)  Please return for lab work one (1) week before your next appointment.   Prescriptions: LEVOTHYROXINE SODIUM 25 MCG TABS (LEVOTHYROXINE SODIUM) one by mouth once daily  #30 x 3   Entered and Authorized by:   [name]   Signed by:   [name] on 03/21/2010   Method used:   [facility] to        [facility] [address] [id number]       [address]       [place], [place]  [address]       Ph: [phone]       Fax: [phone]   RxID:   [id number]    Orders Added: 1)  Misc. Referral [Misc. Ref] 2)  Misc. Referral [Misc. Ref] 3)  Est. Patient Level IV [24401]    Current Allergies (reviewed today): ! SULFA  Appended Document: Office Visit ([facility] 05)      Allergies: 1)  ! Sulfa     Impression & Recommendations:  Problem # 1:  SLEEP APNEA (ICD-780.57) Assessment Deteriorated Pt with history of OSA.  she uses CPAP regularly but she  needs new mask and machine pt has multiple comorbidities that are affected by OSA ( DM II, Htn, and CAD) pt advised to continue to use CPAP regularly  Complete  Medication List: 1)  Crestor 10 Mg Tabs (Rosuvastatin calcium) .... By mouth once daily 2)  Enalapril Maleate 10 Mg Tabs (Enalapril maleate) .... By mouth once daily 3)  Coreg 25 Mg Tabs (Carvedilol) .... 2 by mouth two times a day 4)  Amlodipine Besylate 5 Mg Tabs (Amlodipine besylate) .... One by mouth once daily 5)  Humulin R 100 Unit/ml Soln (Insulin regular human) .... Inject subcutaneously 20 -30  units three times a day before meals 6)  Novolin N 100 Unit/ml Susp (Insulin isophane human) .... Inject subcutaneously 40 units  every morning  and 30 units every evening 7)  Low-dose Aspirin 81 Mg Tabs (Aspirin) .... One by mouth once daily 8)  Bd Insulin Syringe Ultrafine 30g X 1/2" 1 Ml Misc (Insulin syringe-needle u-100) .... Use for insulin injection four times a day  (250.02 dx code) 9)  Fish Oil 1200 Mg Caps (Omega-3 fatty acids) .... Take 2 capsules by mouth two times a day 10)  Vitamin D3 1000 Unit Caps (Cholecalciferol) .... Take 1 capsule by mouth once a day 11)  Multivitamins Tabs (Multiple vitamin) .... Take 1 tablet by mouth once a day 12)  Lorazepam 0.5 Mg Tabs (Lorazepam) .... Take 1 tab by mouth at bedtime 13)  Warfarin Sodium 5 Mg Tabs (Warfarin sodium) .... 7.5 mg by mouth 6 days a week and 5mg  by mouth one day per week 14)  Oxygen 3.5 L By [place]  .... Pt needs home oxygen and portable system. conserving device is okay. used at bedtime 15)  Calcium-vitamin D 600-200 Mg-unit Tabs (Calcium-vitamin d) .... Take 1 tablet by mouth once a day 16)  Neurontin 300 Mg Caps (Gabapentin) .... One by mouth three times a day prn  17)  Citalopram Hydrobromide 20 Mg Tabs (Citalopram hydrobromide) .... Take 1 tablet by mouth once a day 18)  Torsemide 20 Mg Tabs (Torsemide) .... One and 1/2 tab by mouth two times a day 19)  Hydrocodone-acetaminophen 5-500 Mg Tabs (Hydrocodone-acetaminophen) .... One by mouth two times a day prn 20)  Duragesic-25 25 Mcg/hr Pt72 (Fentanyl) .... Apply new  pacth every  3 days as directed 21)  Levothyroxine Sodium 25 Mcg Tabs (Levothyroxine sodium) .... One by mouth once daily  ]

## 2010-06-15 NOTE — Progress Notes (Signed)
Summary: Status Update Sleep Study  Phone Note Outgoing Call   Call placed by: Glendell Docker CMA,  June 05, 2010 5:04 PM Call placed to: Patient Summary of Call: call placed to patient regarding sleep study, she states that she had the sleep study done several years ago, and does not remember where she had it done. She states she thinks it may have been done with Dr Shelle Iron. She was informed that I did not see any record of a sleep study in her chart.  She was informed , the form that was presented to Dr Artist Pais for completion regarding her CPAP  has information that is unable to be completed without the sleep study. She states she did not want to have the sleep study as advised per Dr Artist Pais in [facility], so she went to [facility] where she obtains all of her supplies and she states they performed testing that met Medicare requirements in-order for her to obtain the CPAP. She was advised to have them forward the infromation to Dr Artist Pais for whatever testing she has had, because without the sleep study the form currently can not be completed. Patient verbalized understanding and states she will have the information sent to Dr Artist Pais. Initial call taken by: Glendell Docker CMA,  June 05, 2010 5:05 PM  Follow-up for Phone Call        I suggest consult with sleep specialist - would she prefer Dr. Shelle Iron or Dr. Vassie Loll Follow-up by: [name],  June 05, 2010 5:30 PM  Additional Follow-up for Phone Call Additional follow up Details #1::        call returned to patient at [phone], she states she had her sleep study between 2003 & 2005 with Dr Shelle Iron. She states she prefers to follow up with Dr . Shelle Iron since the sleep study was originally done with him. She was advised to call and schedule a follow up with Dr Shelle Iron regarding her sleep study. Patient verbalized understanding and agrees as instructed Additional Follow-up by: Glendell Docker CMA,  June 06, 2010 10:00 AM

## 2010-06-15 NOTE — Assessment & Plan Note (Signed)
Summary: follow up Dr Lanna Poche office said they called and changed to 2 ...   Vital Signs:  Patient profile:   70 year old [name] Height:      64 inches Weight:      262 pounds BMI:     45.13 O2 Sat:      94 % on 2 L/min Temp:     Destiny.8 degrees F oral Pulse rate:   78 / minute Pulse rhythm:   regular Resp:     18 per minute BP sitting:   130 / 70  (right arm) Cuff size:   large  Vitals Entered By: Mervin Kung CMA Duncan Dull) (May 22, 2010 1:45 PM)  O2 Flow:  2 L/min CC: Pt here for 2 month follow up., Type 2 diabetes mellitus follow-up Is Patient Diabetic? Yes Pain Assessment Patient in pain? no      Comments Needs New Rx to [facility] for continued oxygen use. Mervin Kung CMA Duncan Dull)  May 22, 2010 1:53 PM    Primary Care Provider:  Dondra Spry DO  CC:  Pt here for 2 month follow up. and Type 2 diabetes mellitus follow-up.  History of Present Illness:  Type 2 Diabetes Mellitus Follow-Up      This is a 70 year old woman who presents for Type 2 diabetes mellitus follow-up.  The patient denies self managed hypoglycemia and hypoglycemia requiring help.  The patient denies the following symptoms: chest pain.  Since the last visit the patient reports compliance with medications and monitoring blood glucose.    CHF - stable pt requires oxygen with ambulation  OSA - needs order for home oxygen    Preventive Screening-Counseling & Management  Alcohol-Tobacco     Alcohol drinks/day: 0     Alcohol Counseling: not indicated; patient does not drink     Smoking Status: never     Tobacco Counseling: not indicated; no tobacco use  Allergies: 1)  ! Sulfa  Past History:  Past Medical History: ICD - IN SITU (ICD-V45.02) LBBB (ICD-426.3)  CAROTID BRUIT (ICD-785.9)      DYSLIPIDEMIA (ICD-272.4)    HYPERTENSION (ICD-401.9)  CONGESTIVE HEART FAILURE (ICD-428.0)  CVA (ICD-434.91) BACK PAIN, CHRONIC (ICD-724.5)   SLEEP APNEA (ICD-780.57) DIABETIC   RETINOPATHY (ICD-250.50)  CARDIOMYOPATHY, ISCHEMIC (ICD-414.8) COUGH (ICD-786.2) UTI'S, RECURRENT (ICD-599.0) ACUTE CYSTITIS (ICD-595.0) RHINOSINUSITIS, ACUTE (ICD-461.8) DYSURIA (ICD-788.1) UNSPECIFIED HYPOTHYROIDISM (ICD-244.9) BACKGROUND DIABETIC RETINOPATHY (ICD-362.01) CANDIDIASIS OF VULVA AND VAGINA (ICD-112.1) DIABETES MELLITUS, TYPE II, UNCONTROLLED (ICD-250.02)  CRI  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 Truett Perna Chronic low back pain, intermittent leg pain due to multilevel DDD  Past Surgical History: Hysterectomy - 1982     Gallbladder -- 1970  CABG -- 2003              Family History: Family History of Cancer:  Family History of Heart problems                 Social History: Married   Alcohol use-no   Tobacco use-no       Retired       Regular Exercise - no  Drug Use - no      Physical Exam  General:  alert, well-developed, and well-nourished.   Lungs:  normal respiratory effort, normal breath sounds, no crackles, and no wheezes.   Heart:  normal rate, regular rhythm, and no gallop.   Abdomen:  soft, non-tender, normal bowel sounds, and no masses.  Extremities:  No lower extremity edema Neurologic:  cranial nerves II-XII intact and gait normal.     Impression & Recommendations:  Problem # 1:  HYPOXEMIA (ICD-799.02)  pt 91 % on room air and quickly desaturates to 87% with exertion  continue oxygen therapy  Orders: Misc. Referral (Misc. Ref)  Problem # 2:  DIABETES MELLITUS, TYPE II, UNCONTROLLED (ICD-250.02) Assessment: Deteriorated pt currently using 20 units before meals two times a day.  pt to increase to 25 - 30.   pt advised to occ check post prandial blood sugars  Her updated medication list for this problem includes:    Enalapril Maleate 10 Mg Tabs (Enalapril maleate) ..... By mouth once daily    Humulin R 100 Unit/ml Soln (Insulin regular human) ..... Inject subcutaneously 20 -30  units two times a day before  meals    Novolin N 100 Unit/ml Susp (Insulin isophane human) ..... Inject subcutaneously 40 units  every morning  and 30 units every evening    Low-dose Aspirin 81 Mg Tabs (Aspirin) ..... One by mouth once daily  Problem # 3:  CONGESTIVE HEART FAILURE (ICD-428.0) Assessment: Unchanged  Her updated medication list for this problem includes:    Enalapril Maleate 10 Mg Tabs (Enalapril maleate) ..... By mouth once daily    Coreg 25 Mg Tabs (Carvedilol) .Marland Kitchen... 2 by mouth two times a day    Low-dose Aspirin 81 Mg Tabs (Aspirin) ..... One by mouth once daily    Warfarin Sodium 5 Mg Tabs (Warfarin sodium) .Marland Kitchen... 7.5 mg by mouth daily.    Torsemide 20 Mg Tabs (Torsemide) ..... One and 1/2 tab by mouth two times a day  Problem # 4:  BACK PAIN, CHRONIC (ICD-724.5) Assessment: Deteriorated use stool softener as directed  The following medications were removed from the medication list:    Hydrocodone-acetaminophen 5-500 Mg Tabs (Hydrocodone-acetaminophen) ..... One by mouth two times a day prn Her updated medication list for this problem includes:    Low-dose Aspirin 81 Mg Tabs (Aspirin) ..... One by mouth once daily    Duragesic-25 25 Mcg/hr Pt72 (Fentanyl) .Marland Kitchen... Apply new  pacth every 3 days as directed  Complete Medication List: 1)  Crestor 10 Mg Tabs (Rosuvastatin calcium) .... By mouth once daily 2)  Enalapril Maleate 10 Mg Tabs (Enalapril maleate) .... By mouth once daily 3)  Coreg 25 Mg Tabs (Carvedilol) .... 2 by mouth two times a day 4)  Amlodipine Besylate 5 Mg Tabs (Amlodipine besylate) .... One by mouth once daily 5)  Humulin R 100 Unit/ml Soln (Insulin regular human) .... Inject subcutaneously 20 -30  units two times a day before meals 6)  Novolin N 100 Unit/ml Susp (Insulin isophane human) .... Inject subcutaneously 40 units  every morning  and 30 units every evening 7)  Low-dose Aspirin 81 Mg Tabs (Aspirin) .... One by mouth once daily 8)  Bd Insulin Syringe Ultrafine 30g X 1/2" 1 Ml  Misc (Insulin syringe-needle u-100) .... Use for insulin injection four times a day  (250.02 dx code) 9)  Fish Oil 1200 Mg Caps (Omega-3 fatty acids) .... Take 2 capsules by mouth two times a day 10)  Vitamin D3 1000 Unit Caps (Cholecalciferol) .... Take 1 capsule by mouth once a day 11)  Multivitamins Tabs (Multiple vitamin) .... Take 1 tablet by mouth once a day 12)  Lorazepam 0.5 Mg Tabs (Lorazepam) .... Take 1 tab by mouth at bedtime 13)  Warfarin Sodium 5 Mg Tabs (Warfarin sodium) .... 7.5 mg  by mouth daily. 14)  Oxygen 3.5 L By [place]  .... Pt needs home oxygen and portable system. conserving device is okay. used at bedtime 15)  Calcium-vitamin D 600-200 Mg-unit Tabs (Calcium-vitamin d) .... Take 1 tablet by mouth once a day 16)  Neurontin 300 Mg Caps (Gabapentin) .... One by mouth once a day prn 17)  Citalopram Hydrobromide 20 Mg Tabs (Citalopram hydrobromide) .... Take 1 tablet by mouth once a day 18)  Torsemide 20 Mg Tabs (Torsemide) .... One and 1/2 tab by mouth two times a day 19)  Duragesic-25 25 Mcg/hr Pt72 (Fentanyl) .... Apply new  pacth every 3 days as directed 20)  Levothyroxine Sodium 25 Mcg Tabs (Levothyroxine sodium) .... One by mouth once daily 21)  Colace 100 Mg Caps (Docusate sodium) .... One by mouth two times a day  Patient Instructions: 1)  Please schedule a follow-up appointment in 3 months. 2)  BMP prior to visit, ICD-9: 250.02 3)  HbgA1C prior to visit, ICD-9:250.02 4)  BNP:  428.0 5)  Use 1/2 dose of OTC miralax if you still have constipation after starting stool softener Prescriptions: LEVOTHYROXINE SODIUM 25 MCG TABS (LEVOTHYROXINE SODIUM) one by mouth once daily  #30 x 3   Entered and Authorized by:   [name]   Signed by:   [name] on 05/22/2010   Method used:   [facility]        [id number]       [address]       [place], [place]  [address]       Ph: [phone]       Fax:  [phone]   RxID:   [id number] HUMULIN R 100 UNIT/ML SOLN (INSULIN REGULAR HUMAN) inject subcutaneously 20 -30  units two times a day before meals  #1 month x 5   Entered and Authorized by:   [name]   Signed by:   [name] on 05/22/2010   Method used:   [facility]        [facility] [id number]       [address]       [place], [place]  [address]       Ph: [phone]       Fax: [phone]   RxID:   [id number] NOVOLIN N 100 UNIT/ML SUSP (INSULIN ISOPHANE HUMAN) inject subcutaneously 40 units  every morning  and 30 units every evening  #1 month x 5   Entered and Authorized by:   [name]   Signed by:   [name] on 05/22/2010   Method used:   [facility]        [facility] [address] [id number]       [address]       [place], [place]  [address]       Ph: [phone]       Fax: [phone]   RxID:   [id number] CRESTOR 10 MG  TABS (ROSUVASTATIN CALCIUM) by mouth once daily  #30 x 5   Entered and Authorized by:   [name]   Signed by:   [name] on 05/22/2010   Method used:   [facility]  [facility] [address] [id number]       [address]       [place], [place]  [address]       Ph: [phone]       Fax: [phone]   RxID:   [id number]    Orders Added: 1)  Misc. Referral [Misc. Ref] 2)  Est. Patient Level III [24401]     Current Allergies (reviewed today): ! SULFA

## 2010-06-15 NOTE — Cardiovascular Report (Signed)
Summary: Office Visit Remote   Office Visit Remote   Imported By: Roderic Ovens 05/16/2010 16:13:52  _____________________________________________________________________  External Attachment:    Type:   Image     Comment:   External Document

## 2010-06-15 NOTE — Assessment & Plan Note (Signed)
Summary: pacer rep  Nurse Visit  Comments patient seen by pacer rep   Allergies: 1)  ! Sulfa

## 2010-06-15 NOTE — Assessment & Plan Note (Signed)
Summary: 6 MO FUL  Medications Added NEURONTIN 300 MG CAPS (GABAPENTIN) one by mouth three times a day prn      Allergies Added:   Primary Provider:  Dondra Spry DO   History of Present Illness: the patient is a 70 year old female with a history of coronary artery disease status post coronary bypass grafting, CRT-D, diabetes mellitus hypertension and dyslipidemia. She also has a history of obstructive sleep apnea. She has a history of variable ejection fractions. Several months ago she required hospitalization for congestive heart failure. She also has a history of non-Hodgkin lymphoma of the left thigh which is in remission status post chemotherapy.  Repeat echocardiogram reportedly was done with evidence for decreased ejection fraction. Prior to this her ejection fraction was 60-65%. Of note is also the patient and the current EKG appears significantly wide QRS changes from a prior tracing in August questioning whether the LV lead is working properly. The patient states that his left shoulder breath however she has been switched from furosemide torsemide. She also reports to me that she may need a pacemaker placement in 6 months secondary to EOL.  she denies any chest pain orthopnea PND. She reports no palpitations or syncope      Preventive Screening-Counseling & Management  Alcohol-Tobacco     Smoking Status: never  Current Medications (verified): 1)  Crestor 10 Mg  Tabs (Rosuvastatin Calcium) .... By Mouth Once Daily 2)  Enalapril Maleate 10 Mg  Tabs (Enalapril Maleate) .... By Mouth Once Daily 3)  Coreg 25 Mg  Tabs (Carvedilol) .... 2 By Mouth Two Times A Day 4)  Amlodipine Besylate 5 Mg Tabs (Amlodipine Besylate) .... One By Mouth Once Daily 5)  Humulin R 100 Unit/ml Soln (Insulin Regular Human) .... Inject Subcutaneously 20 -30  Units Three Times A Day Before Meals 6)  Novolin N 100 Unit/ml Susp (Insulin Isophane Human) .... Inject Subcutaneously 40 Units  Every Morning   and 30 Units Every Evening 7)  Low-Dose Aspirin 81 Mg  Tabs (Aspirin) .... One By Mouth Once Daily 8)  Bd Insulin Syringe Ultrafine 30g X 1/2" 1 Ml Misc (Insulin Syringe-Needle U-100) .... Use For Insulin Injection Four Times A Day  (250.02 Dx Code) 9)  Fish Oil 1200 Mg Caps (Omega-3 Fatty Acids) .... Take 2 Capsules By Mouth Two Times A Day 10)  Vitamin D3 1000 Unit Caps (Cholecalciferol) .... Take 1 Capsule By Mouth Once A Day 11)  Multivitamins  Tabs (Multiple Vitamin) .... Take 1 Tablet By Mouth Once A Day 12)  Lorazepam 0.5 Mg Tabs (Lorazepam) .... Take 1 Tab By Mouth At Bedtime 13)  Warfarin Sodium 5 Mg Tabs (Warfarin Sodium) .... 7.5 Mg By Mouth 6 Days A Week and 5mg  By Mouth One Day Per Week 14)  Oxygen 3.5 L By [place] .... Pt Needs Home Oxygen and Portable System. Conserving Device Is Okay. Used At Bedtime 15)  Calcium-Vitamin D 600-200 Mg-Unit Tabs (Calcium-Vitamin D) .... Take 1 Tablet By Mouth Once A Day 16)  Neurontin 300 Mg Caps (Gabapentin) .... One By Mouth Three Times A Day Prn 17)  Citalopram Hydrobromide 20 Mg Tabs (Citalopram Hydrobromide) .... Take 1 Tablet By Mouth Once A Day 18)  Torsemide 20 Mg Tabs (Torsemide) .... One and 1/2 Tab By Mouth Two Times A Day 19)  Hydrocodone-Acetaminophen 5-500 Mg Tabs (Hydrocodone-Acetaminophen) .... One By Mouth Two Times A Day Prn 20)  Duragesic-25 25 Mcg/hr Pt72 (Fentanyl) .... Apply New  Pacth Every 3 Days As  Directed 21)  Levothyroxine Sodium 25 Mcg Tabs (Levothyroxine Sodium) .... One By Mouth Once Daily  Allergies (verified): 1)  ! Sulfa  Comments:  Nurse/Medical Assistant: The patient's medication list and allergies were reviewed with the patient and were updated in the Medication and Allergy Lists.  Past History:  Past Medical History: Last updated: 03/21/2010 ICD - IN SITU (ICD-V45.02) LBBB (ICD-426.3)  CAROTID BRUIT (ICD-785.9)      DYSLIPIDEMIA (ICD-272.4)   HYPERTENSION (ICD-401.9)  CONGESTIVE HEART FAILURE  (ICD-428.0)  CVA (ICD-434.91) BACK PAIN, CHRONIC (ICD-724.5)   SLEEP APNEA (ICD-780.57) DIABETIC  RETINOPATHY (ICD-250.50)  CARDIOMYOPATHY, ISCHEMIC (ICD-414.8) COUGH (ICD-786.2) UTI'S, RECURRENT (ICD-599.0) ACUTE CYSTITIS (ICD-595.0) RHINOSINUSITIS, ACUTE (ICD-461.8) DYSURIA (ICD-788.1) UNSPECIFIED HYPOTHYROIDISM (ICD-244.9) BACKGROUND DIABETIC RETINOPATHY (ICD-362.01) CANDIDIASIS OF VULVA AND VAGINA (ICD-112.1) DIABETES MELLITUS, TYPE II, UNCONTROLLED (ICD-250.02)  CRI  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 Truett Perna  Past Surgical History: Last updated: 03/21/2010 Hysterectomy - 1982     Gallbladder -- 1970  CABG -- 2003             Family History: Last updated: 03/21/2010 Family History of Cancer:  Family History of Heart problems                Social History: Last updated: 03/21/2010 Married   Alcohol use-no   Tobacco use-no      Retired       Regular Exercise - no  Drug Use - no      Risk Factors: Alcohol Use: 0 (09/21/2008) Caffeine Use: 2 beverages daily (09/21/2008) Exercise: no (09/21/2008)  Risk Factors: Smoking Status: never (05/18/2010)  Review of Systems       The patient complains of weight loss and dyspnea on exertion.  The patient denies fatigue, malaise, fever, weight gain/loss, vision loss, decreased hearing, hoarseness, chest pain, palpitations, shortness of breath, prolonged cough, wheezing, sleep apnea, coughing up blood, abdominal pain, blood in stool, nausea, vomiting, diarrhea, heartburn, incontinence, blood in urine, muscle weakness, joint pain, leg swelling, rash, skin lesions, headache, fainting, dizziness, depression, anxiety, enlarged lymph nodes, easy bruising or bleeding, and environmental allergies.    Vital Signs:  Patient profile:   70 year old female Height:      64 inches Weight:      262 pounds O2 Sat:      91 % on Room air Pulse rate:   82 / minute BP sitting:   127 / 65  (left arm) Cuff size:    large  Vitals Entered By: Carlye Grippe (May 18, 2010 10:23 AM)  O2 Flow:  Room air  Physical Exam  Additional Exam:  General: overweight white female, well-nourished in no distress head: Normocephalic and atraumatic eyes PERRLA/EOMI intact, conjunctiva and lids normal nose: No deformity or lesions mouth normal dentition, normal posterior pharynx neck: Supple, no JVD.  No masses, thyromegaly or abnormal cervical nodes. Normal carotid upstroke lungs: Normal breath sounds bilaterally without wheezing.  Normal percussion heart: regular rate and rhythm with normal S1 and S2, no S3 or S4.  PMI is normal.  No pathological murmurs abdomen: Normal bowel sounds, abdomen is soft and nontender without masses, organomegaly or hernias noted.  No hepatosplenomegaly musculoskeletal: Back normal, normal gait muscle strength and tone normal pulsus: Pulse is normal in all 4 extremities Extremities: No peripheral pitting edema neurologic: Alert and oriented x 3 skin: Intact without lesions or rashes cervical nodes: No significant adenopathy psychologic: Normal affect    EKG  Procedure date:  05/18/2010  Findings:      AV sequential paced rhythm. QRS with 180 ms. Heart rate 78 beats per minute   ICD Specifications Following MD:  Lewayne Bunting, MD     Referring MD:  [facility] ICD Vendor:  [employer]     ICD Model Number:  [id number]     ICD Serial Number:  [id number] ICD DOI:  08/02/2005      Lead 1:    Location: RA     DOI: 08/02/2005     Model #: [id number]     Serial #: [id number]     Status: active Lead 2:    Location: RV     DOI: 08/02/2005     Model #: 7001     Serial #: [id number]     Status: active Lead 3:    Location: LV     DOI: 08/02/2005     Model #: [id number]     Serial #: [id number]     Status: active  Indications::  ICM   ICD Follow Up ICD Dependent:  No      Episodes Coumadin:  Yes  Brady Parameters Mode DDDR     Lower Rate Limit:  60     Upper Rate Limit 120 PAV 150     Sensed AV Delay:   100  Tachy Zones VF:  240     VT:  194     VT1:  174     Impression & Recommendations:  Problem # 1:  ATRIAL FIBRILLATION, PAROXYSMAL (ICD-427.31) the patient is on chronic Coumadin therapy. She also has a prior history of LV thrombus. She remained in AV sequential paced rhythm Her updated medication list for this problem includes:    Coreg 25 Mg Tabs (Carvedilol) .Marland Kitchen... 2 by mouth two times a day    Low-dose Aspirin 81 Mg Tabs (Aspirin) ..... One by mouth once daily    Warfarin Sodium 5 Mg Tabs (Warfarin sodium) .Marland Kitchen... 7.5 mg by mouth 6 days a week and 5mg  by mouth one day per week  Orders: EKG w/ Interpretation (93000)  Problem # 2:  ICD - IN SITU (ICD-V45.02) the patient is status post CRT D. Given her decline in ejection fraction in the fact that the QRS width has changed considerably in wondering if there is an issue with the LV leads. We will obtain a chest x-ray PA and lateral as well as interrogation of pacemaker by [employer].  Problem # 3:  CARDIOMYOPATHY, ISCHEMIC (ICD-414.8) the patient has a history of ischemic cardiomyopathy and is status post coronary bypass grafting. However ejection fraction has improved over the years. It now appears to have declined again. The echo however was of poor quality. The patient is declining a MUGA scan. We'll continue her medical therapy for now. She seems well volume compensated. Her updated medication list for this problem includes:    Enalapril Maleate 10 Mg Tabs (Enalapril maleate) ..... By mouth once daily    Coreg 25 Mg Tabs (Carvedilol) .Marland Kitchen... 2 by mouth two times a day    Amlodipine Besylate 5 Mg Tabs (Amlodipine besylate) ..... One by mouth once daily    Low-dose Aspirin 81 Mg Tabs (Aspirin) ..... One by mouth once daily    Warfarin Sodium 5 Mg Tabs (Warfarin sodium) .Marland Kitchen... 7.5 mg by mouth 6 days a week and 5mg  by mouth one day per week    Torsemide 20 Mg Tabs (Torsemide) ..... One and 1/2 tab by mouth two times a day  Orders: T-Chest  x-ray, 2 views ([id number])  Patient Instructions: 1)  Your physician recommends that you continue on your current medications as directed. Please refer to the Current Medication list given to you today. 2)  A chest x-ray takes a picture of the organs and structures inside the chest, including the heart, lungs, and blood vessels. This test can show several things, including, whether the heart is enlarged; whether fluid is building up in the lungs; and whether pacemaker / defibrillator leads are still in place. Please have this done today at [facility]. 3)  Your appointment with Dr. Artist Pais for Monday 9th, has been changed to 2:00pm. 4)  You have an appointment here 05/22/10 @10 :30am with Arlys John to have your ICD (LV lead ?malfunction) evaluated. 5)  Your physician wants you to follow-up in: 6 months.  You will receive a reminder letter in the mail about two months in advance. If you don't receive a letter, please call our office to schedule the follow-up appointment.

## 2010-06-15 NOTE — Progress Notes (Signed)
Summary: Mayford Knife needs verification for CPAP  Phone Note Call from Patient   Caller: patient  Call For: yoo  Summary of Call: [facility] in [place] needs Dr Artist Pais to fax stating that she uses her CPAP each night and that it helps her when she uses and that Dr Artist Pais recommends that she get a new one.  Medicare requires that the supplier have a statement from the doctor stating she uses and benefits from the machine  Initial call taken by: Roselle Locus,  April 25, 2010 4:40 PM  Follow-up for Phone Call        11/8 notes was appended plz fax copy of note re:  sleep apnea  Follow-up by: [name],  May 01, 2010 5:21 PM  Additional Follow-up for Phone Call Additional follow up Details #1::        Notes faxed to [facility], [phone]. Pt notified of status. Nicki Guadalajara Fergerson CMA Duncan Dull)  May 03, 2010 10:11 AM

## 2010-06-15 NOTE — Letter (Signed)
Summary: Remote Device Check  [facility], Main Office  [address]   [place], [place] [address]   Phone: [phone]  Fax: [phone]     May 16, 2010 MRN: [id number]   [facility] [address] [place], [place]  [address]   Dear Ms. Wade,   Your remote transmission was recieved and reviewed by your physician.  All diagnostics were within normal limits for you.    __X____Your next office visit is scheduled for:  April 2012 with Dr Ladona Ridgel. Please call our office to schedule an appointment.    Sincerely,  Vella Kohler

## 2010-06-15 NOTE — Progress Notes (Signed)
Summary: Novolin Refill  Phone Note Refill Request Message from:  Patient on May 16, 2010 3:11 PM  Refills Requested: Medication #1:  NOVOLIN N 100 UNIT/ML SUSP inject subcutaneously 40 units  every morning  and 30 units every evening   Dosage confirmed as above?Dosage Confirmed   Brand Name Necessary? No   Supply Requested: 1 month [facility] [address] [employer] patient is out of insulin    Method Requested: Electronic Next Appointment Scheduled: 05-18-10 Cardiology  Initial call taken by: Roselle Locus,  May 16, 2010 3:11 PM  Follow-up for Phone Call        Rx completed in Dr. Tiajuana Amass Follow-up by: Glendell Docker CMA,  May 16, 2010 4:00 PM    Prescriptions: NOVOLIN N 100 UNIT/ML SUSP (INSULIN ISOPHANE HUMAN) inject subcutaneously 40 units  every morning  and 30 units every evening  #30 x 3   Entered by:   Glendell Docker CMA   Authorized by:   [name]   Signed by:   Glendell Docker CMA on 05/16/2010   Method used:   [facility]        [facility]  [address] [facility] [address] [facility]       [address]       [place], [place]  [address]       Ph: [phone]       Fax: [phone]   RxID:   [id number]

## 2010-06-15 NOTE — Letter (Signed)
Summary: Engineer, materials at [facility]  [address]   [place], [place] [address]   Phone: [phone]  Fax: [phone]        May 25, 2010 MRN: [id number]   [facility] [address] [place], [place]  [address]   Dear Ms. Kibler,  Your test ordered by Selena Batten has been reviewed by your physician (or physician assistant) and was found to be normal or stable. Your physician (or physician assistant) felt no changes were needed at this time.  ____ Echocardiogram  ____ Cardiac Stress Test  ____ Lab Work  ____ Peripheral vascular study of arms, legs or neck  __X__ CT scan or X-ray (chest)  ____ Lung or Breathing test  ____ Other:   Thank you.   Hoover Brunette, LPN    Duane Boston, M.D., F.A.C.C. Thressa Sheller, M.D., F.A.C.C. Oneal Grout, M.D., F.A.C.C. Cheree Ditto, M.D., F.A.C.C. Daiva Nakayama, M.D., F.A.C.C. Kenney Houseman, M.D., F.A.C.C. Jeanne Ivan, PA-C

## 2010-06-16 ENCOUNTER — Encounter (INDEPENDENT_AMBULATORY_CARE_PROVIDER_SITE_OTHER): Payer: Medicare Other

## 2010-06-16 ENCOUNTER — Encounter: Payer: Self-pay | Admitting: Cardiology

## 2010-06-16 ENCOUNTER — Ambulatory Visit: Admit: 2010-06-16 | Payer: Self-pay

## 2010-06-16 DIAGNOSIS — I4891 Unspecified atrial fibrillation: Secondary | ICD-10-CM

## 2010-06-16 DIAGNOSIS — Z7901 Long term (current) use of anticoagulants: Secondary | ICD-10-CM

## 2010-06-21 NOTE — Letter (Addendum)
Summary: Internal Correspondence/ [facility]  Internal Correspondence/ [facility]   Imported By: Dorise Hiss 06/15/2010 15:20:24  _____________________________________________________________________  External Attachment:    Type:   Image     Comment:   External Document

## 2010-06-21 NOTE — Medication Information (Signed)
Summary: rov/ewj  Anticoagulant Therapy  Managed by: Windell Hummingbird, RN Referring MD: Lewayne Bunting PCP: Dondra Spry DO Supervising MD: Jens Som MD, Arlys John Indication 1: Left Ventricular Dysfunction (ICD-LVD) Indication 2: Implant date 08/02/05--global 90 days (ICD-**) Lab Used: [facility] Site: [facility] INR POC 2.4 INR RANGE 2 - 3  Dietary changes: no    Health status changes: no    Bleeding/hemorrhagic complications: no    Recent/future hospitalizations: no    Any changes in medication regimen? no    Recent/future dental: no  Any missed doses?: no       Is patient compliant with meds? yes       Allergies: 1)  ! Sulfa  Anticoagulation Management History:      The patient is taking warfarin and comes in today for a routine follow up visit.  Positive risk factors for bleeding include an age of 70 years or older, history of CVA/TIA, and presence of serious comorbidities.  The bleeding index is 'high risk'.  Positive CHADS2 values include History of CHF, History of HTN, History of Diabetes, and Prior Stroke/CVA/TIA.  Negative CHADS2 values include Age > 59 years old.  The start date was 09/20/2001.  Her last INR was 2.5.  Anticoagulation responsible provider: Jens Som MD, Arlys John.  INR POC: 2.4.  Cuvette Lot#: [id number].  Exp: 05/2011.    Anticoagulation Management Assessment/Plan:      The patient's current anticoagulation dose is Warfarin sodium 5 mg tabs: 7.5 mg by mouth daily..  The target INR is 2.0-3.0.  The next INR is due 07/14/2010.  Anticoagulation instructions were given to patient.  Results were reviewed/authorized by Windell Hummingbird, RN.  She was notified by Windell Hummingbird, RN.         Prior Anticoagulation Instructions: INR 2.8 Continue 7.5mg s daily. Recheck in 4 weeks.   Current Anticoagulation Instructions: INR 2.4 Continue taking 1 1/2 tablets every day. Recheck 4 weeks.

## 2010-06-25 ENCOUNTER — Encounter (INDEPENDENT_AMBULATORY_CARE_PROVIDER_SITE_OTHER): Payer: Self-pay | Admitting: *Deleted

## 2010-06-27 ENCOUNTER — Encounter: Payer: Self-pay | Admitting: Internal Medicine

## 2010-06-28 ENCOUNTER — Encounter: Payer: Self-pay | Admitting: Internal Medicine

## 2010-06-28 ENCOUNTER — Telehealth: Payer: Self-pay | Admitting: Internal Medicine

## 2010-06-29 ENCOUNTER — Encounter: Payer: Self-pay | Admitting: Internal Medicine

## 2010-07-05 NOTE — Progress Notes (Signed)
Summary: recertify oxygen  Phone Note From Other Clinic   Caller: Olegario Messier Call For: nurse Request: Talk with Nurse Summary of Call: Olegario Messier with [employer] [phone] called requesting dr. Artist Pais to recertify oxygen for pt. please assist. Initial call taken by: Elba Barman,  June 28, 2010 9:27 AM  Follow-up for Phone Call        Recertification form received and forwarded to Provider for signature. Nicki Guadalajara Fergerson CMA Duncan Dull)  June 28, 2010 4:23 PM   Additional Follow-up for Phone Call Additional follow up Details #1::        signed [name]  June 29, 2010 8:26 AM  Additional Follow-up by: [name],  June 29, 2010 8:26 AM

## 2010-07-05 NOTE — Letter (Signed)
Summary: Remote Device Check  [facility], Main Office  [address]   [place], [place] [address]   Phone: [phone]  Fax: [phone]     June 25, 2010 MRN: [id number]   [facility] [place] [address] [place], [place]  [address]   Dear Ms. Hicks,   Your remote transmission was recieved and reviewed by your physician.  All diagnostics were within normal limits for you.  __X____Your next office visit is scheduled for:   April 2012 with Dr Ladona Ridgel. Please call our office to schedule an appointment.    Sincerely,  Vella Kohler

## 2010-07-05 NOTE — Cardiovascular Report (Signed)
Summary: Office Visit Remote   Office Visit Remote   Imported By: Roderic Ovens 06/27/2010 14:51:33  _____________________________________________________________________  External Attachment:    Type:   Image     Comment:   External Document

## 2010-07-06 ENCOUNTER — Telehealth: Payer: Self-pay | Admitting: Internal Medicine

## 2010-07-07 ENCOUNTER — Telehealth: Payer: Self-pay | Admitting: Internal Medicine

## 2010-07-10 ENCOUNTER — Encounter: Payer: Self-pay | Admitting: Internal Medicine

## 2010-07-11 NOTE — Progress Notes (Signed)
Summary: [facility]: Office Visit  [facility]: Office Visit   Imported By: Earl Many 06/27/2010 18:24:17  _____________________________________________________________________  External Attachment:    Type:   Image     Comment:   External Document

## 2010-07-11 NOTE — Progress Notes (Signed)
Summary: refill-humulin  Phone Note Refill Request Message from:  Fax from Pharmacy on July 07, 2010 10:29 AM  Refills Requested: Medication #1:  HUMULIN R 100 UNIT/ML SOLN inject subcutaneously 20 -30  units two times a day before meals   Dosage confirmed as above?Dosage Confirmed   Brand Name Necessary? No   Supply Requested: 1 month   Last Refilled: 05/23/2010 [facility] [address] [place] [address] fax [phone]   Method Requested: Electronic Next Appointment Scheduled: 4.18.12 Dionicio Shelnutt Initial call taken by: Elba Barman,  July 07, 2010 10:31 AM    Prescriptions: HUMULIN R 100 UNIT/ML SOLN (INSULIN REGULAR HUMAN) inject subcutaneously 20 -30  units two times a day before meals  #1 month x 4   Entered by:   Mervin Kung CMA (AAMA)   Authorized by:   [name]   Signed by:   Mervin Kung CMA (AAMA) on 07/07/2010   Method used:   Electronically to        [facility]* (retail)       [address]       [place], [place]  [address]       Ph: [phone]       Fax: [phone]   RxID:   [id number]

## 2010-07-11 NOTE — Letter (Signed)
Summary: Recertification for Home Oxygen  Recertification for Home Oxygen   Imported By: Maryln Gottron 07/06/2010 13:43:04  _____________________________________________________________________  External Attachment:    Type:   Image     Comment:   External Document

## 2010-07-12 ENCOUNTER — Encounter: Payer: Self-pay | Admitting: Internal Medicine

## 2010-07-12 DIAGNOSIS — I4891 Unspecified atrial fibrillation: Secondary | ICD-10-CM

## 2010-07-12 DIAGNOSIS — I238 Other current complications following acute myocardial infarction: Secondary | ICD-10-CM

## 2010-07-12 DIAGNOSIS — I6350 Cerebral infarction due to unspecified occlusion or stenosis of unspecified cerebral artery: Secondary | ICD-10-CM

## 2010-07-17 ENCOUNTER — Telehealth: Payer: Self-pay | Admitting: Internal Medicine

## 2010-07-18 ENCOUNTER — Encounter (INDEPENDENT_AMBULATORY_CARE_PROVIDER_SITE_OTHER): Payer: Medicare Other

## 2010-07-18 ENCOUNTER — Encounter: Payer: Self-pay | Admitting: Cardiology

## 2010-07-18 DIAGNOSIS — I238 Other current complications following acute myocardial infarction: Secondary | ICD-10-CM

## 2010-07-18 DIAGNOSIS — I4891 Unspecified atrial fibrillation: Secondary | ICD-10-CM

## 2010-07-18 LAB — CONVERTED CEMR LAB: POC INR: 2.6

## 2010-07-20 NOTE — Progress Notes (Signed)
Summary: Prior Auth--BD insulin syringe  Phone Note Other Incoming   Caller: Quantity Limit Insulin syringe from [facility] Summary of Call: Received letter from [facility] re: quantity limit for BD insulin syringe. Spoke to [place] @ [phone], they will cover 100 syringes in 30 days. Prior Berkley Harvey form is being faxed.  Initial call taken by: Mervin Kung CMA Duncan Dull),  July 06, 2010 11:05 AM  Follow-up for Phone Call        PA form received, completed and forwarded to Provider for signature. Nicki Guadalajara Fergerson CMA Duncan Dull)  July 06, 2010 4:51 PM   Additional Follow-up for Phone Call Additional follow up Details #1::        form completed and Faxed to [facility]. Awaiting approval/denial status Additional Follow-up by: Glendell Docker CMA,  July 11, 2010 9:17 AM    Additional Follow-up for Phone Call Additional follow up Details #2::    Insulin syringes approved  through 05/14/2011 Follow-up by: Glendell Docker CMA,  July 12, 2010 8:11 AM

## 2010-07-20 NOTE — Medication Information (Signed)
Summary: Prior Authorization Request for Insulin Syringes   Prior Authorization Request for Insulin Syringes   Imported By: Maryln Gottron 07/14/2010 15:52:09  _____________________________________________________________________  External Attachment:    Type:   Image     Comment:   External Document

## 2010-07-25 NOTE — Medication Information (Signed)
Summary: rov/pc      Allergies Added:  Anticoagulant Therapy  Managed by: Samantha Crimes, PharmD Referring MD: Lewayne Bunting PCP: Dondra Spry DO Supervising MD: Antoine Poche MD, Fayrene Fearing Indication 1: Left Ventricular Dysfunction (ICD-LVD) Indication 2: Implant date 08/02/05--global 90 days (ICD-**) Lab Used: [facility] Site: [facility] INR POC 2.6 INR RANGE 2 - 3  Dietary changes: no    Health status changes: no    Bleeding/hemorrhagic complications: no    Recent/future hospitalizations: no    Any changes in medication regimen? no    Recent/future dental: no  Any missed doses?: no       Is patient compliant with meds? yes       Current Medications (verified): 1)  Crestor 10 Mg  Tabs (Rosuvastatin Calcium) .... By Mouth Once Daily 2)  Enalapril Maleate 10 Mg  Tabs (Enalapril Maleate) .... By Mouth Once Daily 3)  Coreg 25 Mg  Tabs (Carvedilol) .... 2 By Mouth Two Times A Day 4)  Amlodipine Besylate 5 Mg Tabs (Amlodipine Besylate) .... One By Mouth Once Daily 5)  Humulin R 100 Unit/ml Soln (Insulin Regular Human) .... Inject Subcutaneously 20 -30  Units Two Times A Day Before Meals 6)  Novolin N 100 Unit/ml Susp (Insulin Isophane Human) .... Inject Subcutaneously 40 Units  Every Morning  and 30 Units Every Evening 7)  Low-Dose Aspirin 81 Mg  Tabs (Aspirin) .... One By Mouth Once Daily 8)  Bd Insulin Syringe Ultrafine 30g X 1/2" 1 Ml Misc (Insulin Syringe-Needle U-100) .... Use For Insulin Injection Four Times A Day  (250.02 Dx Code) 9)  Fish Oil 1200 Mg Caps (Omega-3 Fatty Acids) .... Take 2 Capsules By Mouth Two Times A Day 10)  Vitamin D3 1000 Unit Caps (Cholecalciferol) .... Take 1 Capsule By Mouth Once A Day 11)  Multivitamins  Tabs (Multiple Vitamin) .... Take 1 Tablet By Mouth Once A Day 12)  Lorazepam 0.5 Mg Tabs (Lorazepam) .... Take 1 Tab By Mouth At Bedtime 13)  Warfarin Sodium 5 Mg Tabs (Warfarin Sodium) .... 7.5 Mg By Mouth Daily. 14)  Oxygen 3.5 L By [place] .... Pt  Needs Home Oxygen and Portable System. Conserving Device Is Okay. Used At Bedtime 15)  Calcium-Vitamin D 600-200 Mg-Unit Tabs (Calcium-Vitamin D) .... Take 1 Tablet By Mouth Once A Day 16)  Neurontin 300 Mg Caps (Gabapentin) .... One By Mouth Once A Day Prn 17)  Citalopram Hydrobromide 20 Mg Tabs (Citalopram Hydrobromide) .... Take 1 Tablet By Mouth Once A Day 18)  Torsemide 20 Mg Tabs (Torsemide) .... One and 1/2 Tab By Mouth Two Times A Day 19)  Duragesic-25 25 Mcg/hr Pt72 (Fentanyl) .... Apply New  Pacth Every 3 Days As Directed 20)  Levothyroxine Sodium 25 Mcg Tabs (Levothyroxine Sodium) .... One By Mouth Once Daily 21)  Colace 100 Mg Caps (Docusate Sodium) .... One By Mouth Two Times A Day  Allergies (verified): 1)  ! Sulfa  Anticoagulation Management History:      Positive risk factors for bleeding include an age of 65 years or older, history of CVA/TIA, and presence of serious comorbidities.  The bleeding index is 'high risk'.  Positive CHADS2 values include History of CHF, History of HTN, History of Diabetes, and Prior Stroke/CVA/TIA.  Negative CHADS2 values include Age > 66 years old.  The start date was 09/20/2001.  Her last INR was 2.5.  Anticoagulation responsible provider: Antoine Poche MD, Fayrene Fearing.  INR POC: 2.6.  Exp: 05/2011.    Anticoagulation Management  Assessment/Plan:      The patient's current anticoagulation dose is Warfarin sodium 5 mg tabs: 7.5 mg by mouth daily..  The target INR is 2.0-3.0.  The next INR is due 08/15/2010.  Anticoagulation instructions were given to patient.  Results were reviewed/authorized by Samantha Crimes, PharmD.         Prior Anticoagulation Instructions: INR 2.4 Continue taking 1 1/2 tablets every day. Recheck 4 weeks.  Current Anticoagulation Instructions: Cont with current regimen Return to clinic in one month

## 2010-07-25 NOTE — Medication Information (Signed)
Summary: Prior Authorization Needed for Insulin Syringes  Prior Authorization Needed for Insulin Syringes   Imported By: Maryln Gottron 07/14/2010 14:35:27  _____________________________________________________________________  External Attachment:    Type:   Image     Comment:   External Document

## 2010-07-25 NOTE — Progress Notes (Signed)
Summary: refill-levothyroxine  Phone Note Refill Request Message from:  Fax from Pharmacy on July 17, 2010 10:07 AM  Refills Requested: Medication #1:  LEVOTHYROXINE SODIUM 25 MCG TABS one by mouth once daily   Dosage confirmed as above?Dosage Confirmed   Brand Name Necessary? No   Supply Requested: 1 month   Last Refilled: 06/19/2010 [facility] [address] [place] [address] fax [phone]   Method Requested: Electronic Next Appointment Scheduled: 4.18.12 yoo Initial call taken by: Elba Barman,  July 17, 2010 10:09 AM  Follow-up for Phone Call        Rx completed in Dr. Tiajuana Amass Follow-up by: Glendell Docker CMA,  July 17, 2010 12:27 PM    Prescriptions: LEVOTHYROXINE SODIUM 25 MCG TABS (LEVOTHYROXINE SODIUM) one by mouth once daily  #30 x 0   Entered by:   Glendell Docker CMA   Authorized by:   [name]   Signed by:   Glendell Docker CMA on 07/17/2010   Method used:   Electronically to        [facility]* (retail)       [address]       [place], [place]  [address]       Ph: [phone]       Fax: [phone]   RxID:   [id number]

## 2010-07-28 LAB — CBC
HCT: 37.4 % (ref 36.0–46.0)
HCT: 40.2 % (ref 36.0–46.0)
Hemoglobin: 12.8 g/dL (ref 12.0–15.0)
MCH: 29.9 pg (ref 26.0–34.0)
MCHC: 34.3 g/dL (ref 30.0–36.0)
MCHC: 34.8 g/dL (ref 30.0–36.0)
MCV: 86 fL (ref 78.0–100.0)
MCV: 86.5 fL (ref 78.0–100.0)
Platelets: 176 10*3/uL (ref 150–400)
Platelets: 202 10*3/uL (ref 150–400)
RBC: 4.15 MIL/uL (ref 3.87–5.11)
RDW: 16.3 % — ABNORMAL HIGH (ref 11.5–15.5)
RDW: 16.4 % — ABNORMAL HIGH (ref 11.5–15.5)
WBC: 13.9 10*3/uL — ABNORMAL HIGH (ref 4.0–10.5)
WBC: 7.6 10*3/uL (ref 4.0–10.5)
WBC: 8.5 10*3/uL (ref 4.0–10.5)

## 2010-07-28 LAB — PROTIME-INR
INR: 1.62 — ABNORMAL HIGH (ref 0.00–1.49)
INR: 1.72 — ABNORMAL HIGH (ref 0.00–1.49)
Prothrombin Time: 19.4 seconds — ABNORMAL HIGH (ref 11.6–15.2)
Prothrombin Time: 20.3 seconds — ABNORMAL HIGH (ref 11.6–15.2)
Prothrombin Time: 22.9 seconds — ABNORMAL HIGH (ref 11.6–15.2)

## 2010-07-28 LAB — DIFFERENTIAL
Basophils Absolute: 0 10*3/uL (ref 0.0–0.1)
Basophils Absolute: 0 10*3/uL (ref 0.0–0.1)
Basophils Relative: 0 % (ref 0–1)
Eosinophils Relative: 2 % (ref 0–5)
Eosinophils Relative: 3 % (ref 0–5)
Lymphocytes Relative: 14 % (ref 12–46)
Lymphocytes Relative: 8 % — ABNORMAL LOW (ref 12–46)
Lymphs Abs: 1.2 10*3/uL (ref 0.7–4.0)
Neutro Abs: 6.1 10*3/uL (ref 1.7–7.7)

## 2010-07-28 LAB — COMPREHENSIVE METABOLIC PANEL
AST: 23 U/L (ref 0–37)
Alkaline Phosphatase: 74 U/L (ref 39–117)
BUN: 29 mg/dL — ABNORMAL HIGH (ref 6–23)
CO2: 28 mEq/L (ref 19–32)
Chloride: 101 mEq/L (ref 96–112)
Creatinine, Ser: 1.03 mg/dL (ref 0.4–1.2)
GFR calc Af Amer: >60 mL/min (ref >60)
GFR calc non Af Amer: 53 mL/min — ABNORMAL LOW (ref >60)
Total Bilirubin: 0.6 mg/dL (ref 0.3–1.2)

## 2010-07-28 LAB — CARDIAC PANEL(CRET KIN+CKTOT+MB+TROPI)
CK, MB: 1.8 ng/mL (ref 0.3–4.0)
CK, MB: 1.8 ng/mL (ref 0.3–4.0)
CK, MB: 2.1 ng/mL (ref 0.3–4.0)
Relative Index: INVALID (ref 0.0–2.5)
Total CK: 79 U/L (ref 7–177)
Total CK: 87 U/L (ref 7–177)
Total CK: 97 U/L (ref 7–177)
Troponin I: 0.04 ng/mL (ref 0.00–0.06)

## 2010-07-28 LAB — CULTURE, BLOOD (ROUTINE X 2): Culture: NO GROWTH

## 2010-07-28 LAB — BASIC METABOLIC PANEL
BUN: 38 mg/dL — ABNORMAL HIGH (ref 6–23)
BUN: 48 mg/dL — ABNORMAL HIGH (ref 6–23)
CO2: 32 mEq/L (ref 19–32)
Chloride: 101 mEq/L (ref 96–112)
Chloride: 102 mEq/L (ref 96–112)
Creatinine, Ser: 1.2 mg/dL (ref 0.4–1.2)
GFR calc Af Amer: 54 mL/min — ABNORMAL LOW (ref >60)
GFR calc non Af Amer: 45 mL/min — ABNORMAL LOW (ref >60)
GFR calc non Af Amer: 51 mL/min — ABNORMAL LOW (ref >60)
Glucose, Bld: 101 mg/dL — ABNORMAL HIGH (ref 70–99)
Potassium: 4.3 mEq/L (ref 3.5–5.1)
Potassium: 4.9 mEq/L (ref 3.5–5.1)
Sodium: 141 mEq/L (ref 135–145)

## 2010-07-28 LAB — GLUCOSE, CAPILLARY
Glucose-Capillary: 143 mg/dL — ABNORMAL HIGH (ref 70–99)
Glucose-Capillary: 162 mg/dL — ABNORMAL HIGH (ref 70–99)
Glucose-Capillary: 163 mg/dL — ABNORMAL HIGH (ref 70–99)
Glucose-Capillary: 173 mg/dL — ABNORMAL HIGH (ref 70–99)
Glucose-Capillary: 181 mg/dL — ABNORMAL HIGH (ref 70–99)
Glucose-Capillary: 187 mg/dL — ABNORMAL HIGH (ref 70–99)
Glucose-Capillary: 209 mg/dL — ABNORMAL HIGH (ref 70–99)
Glucose-Capillary: 211 mg/dL — ABNORMAL HIGH (ref 70–99)
Glucose-Capillary: 221 mg/dL — ABNORMAL HIGH (ref 70–99)

## 2010-07-28 LAB — HEMOGLOBIN A1C
Hgb A1c MFr Bld: 8 % — ABNORMAL HIGH (ref <5.7)
Mean Plasma Glucose: 183 mg/dL — ABNORMAL HIGH (ref <117)

## 2010-07-28 LAB — URINE CULTURE
Colony Count: NO GROWTH
Culture  Setup Time: 201108100121
Special Requests: NEGATIVE

## 2010-07-28 LAB — BRAIN NATRIURETIC PEPTIDE: Pro B Natriuretic peptide (BNP): 155 pg/mL — ABNORMAL HIGH (ref 0.0–100.0)

## 2010-07-28 LAB — URINE MICROSCOPIC-ADD ON

## 2010-07-28 LAB — POCT CARDIAC MARKERS

## 2010-07-28 LAB — LIPID PANEL
Cholesterol: 357 mg/dL — ABNORMAL HIGH (ref 0–200)
LDL Cholesterol: UNDETERMINED mg/dL (ref 0–99)

## 2010-07-28 LAB — URINALYSIS, ROUTINE W REFLEX MICROSCOPIC
Bilirubin Urine: NEGATIVE
Nitrite: NEGATIVE
Specific Gravity, Urine: 1.015 (ref 1.005–1.030)
Urobilinogen, UA: 0.2 mg/dL (ref 0.0–1.0)
pH: 5.5 (ref 5.0–8.0)

## 2010-07-28 LAB — TSH: TSH: 2.321 u[IU]/mL (ref 0.350–4.500)

## 2010-07-30 LAB — URINE MICROSCOPIC-ADD ON

## 2010-07-30 LAB — BASIC METABOLIC PANEL
BUN: 41 mg/dL — ABNORMAL HIGH (ref 6–23)
CO2: 32 mEq/L (ref 19–32)
Calcium: 9.1 mg/dL (ref 8.4–10.5)
Calcium: 9.1 mg/dL (ref 8.4–10.5)
Calcium: 9.3 mg/dL (ref 8.4–10.5)
Calcium: 9.3 mg/dL (ref 8.4–10.5)
Creatinine, Ser: 1.22 mg/dL — ABNORMAL HIGH (ref 0.4–1.2)
GFR calc Af Amer: 53 mL/min — ABNORMAL LOW (ref >60)
GFR calc Af Amer: >60 mL/min (ref >60)
GFR calc Af Amer: >60 mL/min (ref >60)
GFR calc non Af Amer: 44 mL/min — ABNORMAL LOW (ref >60)
GFR calc non Af Amer: 45 mL/min — ABNORMAL LOW (ref >60)
GFR calc non Af Amer: 52 mL/min — ABNORMAL LOW (ref >60)
GFR calc non Af Amer: 55 mL/min — ABNORMAL LOW (ref >60)
Glucose, Bld: 183 mg/dL — ABNORMAL HIGH (ref 70–99)
Potassium: 4.7 mEq/L (ref 3.5–5.1)
Potassium: 4.8 mEq/L (ref 3.5–5.1)
Sodium: 137 mEq/L (ref 135–145)
Sodium: 138 mEq/L (ref 135–145)

## 2010-07-30 LAB — CARDIAC PANEL(CRET KIN+CKTOT+MB+TROPI)
CK, MB: 1.5 ng/mL (ref 0.3–4.0)
Relative Index: INVALID (ref 0.0–2.5)
Relative Index: INVALID (ref 0.0–2.5)
Total CK: 88 U/L (ref 7–177)
Troponin I: 0.04 ng/mL (ref 0.00–0.06)
Troponin I: 0.04 ng/mL (ref 0.00–0.06)

## 2010-07-30 LAB — GLUCOSE, CAPILLARY
Glucose-Capillary: 106 mg/dL — ABNORMAL HIGH (ref 70–99)
Glucose-Capillary: 122 mg/dL — ABNORMAL HIGH (ref 70–99)
Glucose-Capillary: 148 mg/dL — ABNORMAL HIGH (ref 70–99)
Glucose-Capillary: 158 mg/dL — ABNORMAL HIGH (ref 70–99)
Glucose-Capillary: 160 mg/dL — ABNORMAL HIGH (ref 70–99)
Glucose-Capillary: 163 mg/dL — ABNORMAL HIGH (ref 70–99)
Glucose-Capillary: 182 mg/dL — ABNORMAL HIGH (ref 70–99)
Glucose-Capillary: 190 mg/dL — ABNORMAL HIGH (ref 70–99)
Glucose-Capillary: 221 mg/dL — ABNORMAL HIGH (ref 70–99)
Glucose-Capillary: 256 mg/dL — ABNORMAL HIGH (ref 70–99)
Glucose-Capillary: 295 mg/dL — ABNORMAL HIGH (ref 70–99)

## 2010-07-30 LAB — CBC
HCT: 34.2 % — ABNORMAL LOW (ref 36.0–46.0)
Hemoglobin: 11.5 g/dL — ABNORMAL LOW (ref 12.0–15.0)
Hemoglobin: 13.1 g/dL (ref 12.0–15.0)
MCH: 29 pg (ref 26.0–34.0)
Platelets: 163 10*3/uL (ref 150–400)
RBC: 3.98 MIL/uL (ref 3.87–5.11)
RBC: 4.04 MIL/uL (ref 3.87–5.11)
RBC: 4.46 MIL/uL (ref 3.87–5.11)

## 2010-07-30 LAB — DIFFERENTIAL
Basophils Absolute: 0 10*3/uL (ref 0.0–0.1)
Basophils Relative: 0 % (ref 0–1)
Eosinophils Absolute: 0.1 10*3/uL (ref 0.0–0.7)
Eosinophils Relative: 1 % (ref 0–5)
Lymphocytes Relative: 8 % — ABNORMAL LOW (ref 12–46)
Lymphs Abs: 0.7 10*3/uL (ref 0.7–4.0)
Monocytes Absolute: 0.8 10*3/uL (ref 0.1–1.0)
Neutro Abs: 8 10*3/uL — ABNORMAL HIGH (ref 1.7–7.7)
Neutrophils Relative %: 83 % — ABNORMAL HIGH (ref 43–77)

## 2010-07-30 LAB — URINALYSIS, ROUTINE W REFLEX MICROSCOPIC
Glucose, UA: NEGATIVE mg/dL
Ketones, ur: NEGATIVE mg/dL
Leukocytes, UA: NEGATIVE
Nitrite: NEGATIVE
pH: 5 (ref 5.0–8.0)

## 2010-07-30 LAB — BLOOD GAS, ARTERIAL
Bicarbonate: 25.1 mEq/L — ABNORMAL HIGH (ref 20.0–24.0)
Expiratory PAP: 6
O2 Saturation: 96.3 %
TCO2: 22.9 mmol/L (ref 0–100)
pH, Arterial: 7.34 — ABNORMAL LOW (ref 7.350–7.400)
pO2, Arterial: 84 mmHg (ref 80.0–100.0)

## 2010-07-30 LAB — LIPID PANEL
LDL Cholesterol: 62 mg/dL (ref 0–99)
Total CHOL/HDL Ratio: 3.3 RATIO
Triglycerides: 211 mg/dL — ABNORMAL HIGH (ref <150)
VLDL: 42 mg/dL — ABNORMAL HIGH (ref 0–40)

## 2010-07-30 LAB — PROTIME-INR
INR: 1.89 — ABNORMAL HIGH (ref 0.00–1.49)
Prothrombin Time: 21.5 seconds — ABNORMAL HIGH (ref 11.6–15.2)
Prothrombin Time: 23.5 seconds — ABNORMAL HIGH (ref 11.6–15.2)

## 2010-07-30 LAB — TSH: TSH: 3.024 u[IU]/mL (ref 0.350–4.500)

## 2010-07-31 LAB — BASIC METABOLIC PANEL
BUN: 25 mg/dL — ABNORMAL HIGH (ref 6–23)
CO2: 30 mEq/L (ref 19–32)
CO2: 33 mEq/L — ABNORMAL HIGH (ref 19–32)
Calcium: 8.8 mg/dL (ref 8.4–10.5)
Calcium: 8.9 mg/dL (ref 8.4–10.5)
Chloride: 96 mEq/L (ref 96–112)
Chloride: 99 mEq/L (ref 96–112)
Creatinine, Ser: 1.16 mg/dL (ref 0.4–1.2)
GFR calc Af Amer: 49 mL/min — ABNORMAL LOW (ref >60)
GFR calc Af Amer: >60 mL/min (ref >60)
GFR calc non Af Amer: 55 mL/min — ABNORMAL LOW (ref >60)
Glucose, Bld: 173 mg/dL — ABNORMAL HIGH (ref 70–99)
Sodium: 138 mEq/L (ref 135–145)
Sodium: 141 mEq/L (ref 135–145)

## 2010-07-31 LAB — GLUCOSE, CAPILLARY
Glucose-Capillary: 138 mg/dL — ABNORMAL HIGH (ref 70–99)
Glucose-Capillary: 170 mg/dL — ABNORMAL HIGH (ref 70–99)
Glucose-Capillary: 190 mg/dL — ABNORMAL HIGH (ref 70–99)
Glucose-Capillary: 266 mg/dL — ABNORMAL HIGH (ref 70–99)

## 2010-07-31 LAB — CBC
HCT: 34.2 % — ABNORMAL LOW (ref 36.0–46.0)
Hemoglobin: 12.2 g/dL (ref 12.0–15.0)
MCHC: 33.7 g/dL (ref 30.0–36.0)
MCV: 88.1 fL (ref 78.0–100.0)
Platelets: 159 10*3/uL (ref 150–400)
RBC: 4.15 MIL/uL (ref 3.87–5.11)
RDW: 14.3 % (ref 11.5–15.5)
RDW: 15.1 % (ref 11.5–15.5)

## 2010-07-31 LAB — POCT CARDIAC MARKERS
CKMB, poc: 1.4 ng/mL (ref 1.0–8.0)
Myoglobin, poc: 105 ng/mL (ref 12–200)

## 2010-07-31 LAB — HEMOGLOBIN A1C: Hgb A1c MFr Bld: 7 % — ABNORMAL HIGH (ref <5.7)

## 2010-07-31 LAB — CARDIAC PANEL(CRET KIN+CKTOT+MB+TROPI)
CK, MB: 1.1 ng/mL (ref 0.3–4.0)
CK, MB: 1.1 ng/mL (ref 0.3–4.0)
CK, MB: 1.6 ng/mL (ref 0.3–4.0)
Relative Index: INVALID (ref 0.0–2.5)
Relative Index: INVALID (ref 0.0–2.5)
Total CK: 67 U/L (ref 7–177)
Total CK: 87 U/L (ref 7–177)
Troponin I: 0.06 ng/mL (ref 0.00–0.06)

## 2010-07-31 LAB — DIFFERENTIAL
Basophils Absolute: 0.1 10*3/uL (ref 0.0–0.1)
Lymphocytes Relative: 13 % (ref 12–46)
Monocytes Absolute: 1.2 10*3/uL — ABNORMAL HIGH (ref 0.1–1.0)
Neutro Abs: 5.5 10*3/uL (ref 1.7–7.7)

## 2010-07-31 LAB — PROTIME-INR
INR: 1.6 — ABNORMAL HIGH (ref 0.00–1.49)
Prothrombin Time: 18.9 seconds — ABNORMAL HIGH (ref 11.6–15.2)

## 2010-08-04 ENCOUNTER — Telehealth: Payer: Self-pay | Admitting: Pulmonary Disease

## 2010-08-07 NOTE — Telephone Encounter (Signed)
ERROR DUPLICATE.Destiny [name]

## 2010-08-10 NOTE — Telephone Encounter (Signed)
Error

## 2010-08-11 ENCOUNTER — Telehealth: Payer: Self-pay | Admitting: Internal Medicine

## 2010-08-11 DIAGNOSIS — G4730 Sleep apnea, unspecified: Secondary | ICD-10-CM

## 2010-08-11 NOTE — Telephone Encounter (Signed)
Ok to refill x 3 

## 2010-08-11 NOTE — Telephone Encounter (Signed)
Refill- lorazepam 0.5mg  tab. Take 1 tablet by mouth daily at bedtime. Qty 30. Last fill 3.5.12

## 2010-08-14 MED ORDER — LORAZEPAM 0.5 MG PO TABS: 0.5000 mg | ORAL_TABLET | Freq: Every day | ORAL | Status: DC

## 2010-08-14 NOTE — Telephone Encounter (Signed)
rx called to pharmacy 

## 2010-08-16 LAB — CBC
HCT: 30.4 % — ABNORMAL LOW (ref 36.0–46.0)
HCT: 31.9 % — ABNORMAL LOW (ref 36.0–46.0)
HCT: 34.8 % — ABNORMAL LOW (ref 36.0–46.0)
HCT: 39.5 % (ref 36.0–46.0)
Hemoglobin: 10.6 g/dL — ABNORMAL LOW (ref 12.0–15.0)
Hemoglobin: 11 g/dL — ABNORMAL LOW (ref 12.0–15.0)
Hemoglobin: 11.3 g/dL — ABNORMAL LOW (ref 12.0–15.0)
Hemoglobin: 12 g/dL (ref 12.0–15.0)
Hemoglobin: 12.9 g/dL (ref 12.0–15.0)
Hemoglobin: 14 g/dL (ref 12.0–15.0)
MCHC: 34.5 g/dL (ref 30.0–36.0)
MCHC: 34.6 g/dL (ref 30.0–36.0)
MCV: 89.7 fL (ref 78.0–100.0)
MCV: 90 fL (ref 78.0–100.0)
MCV: 89 fL (ref 78.0–100.0)
Platelets: 153 10*3/uL (ref 150–400)
Platelets: 184 10*3/uL (ref 150–400)
RBC: 4.19 MIL/uL (ref 3.87–5.11)
RDW: 13.8 % (ref 11.5–15.5)
RDW: 13.9 % (ref 11.5–15.5)
WBC: 10.8 10*3/uL — ABNORMAL HIGH (ref 4.0–10.5)
WBC: 17.5 10*3/uL — ABNORMAL HIGH (ref 4.0–10.5)
WBC: 7.2 10*3/uL (ref 4.0–10.5)
WBC: 7.9 10*3/uL (ref 4.0–10.5)

## 2010-08-16 LAB — BRAIN NATRIURETIC PEPTIDE: Pro B Natriuretic peptide (BNP): 391 pg/mL — ABNORMAL HIGH (ref 0.0–100.0)

## 2010-08-16 LAB — URINALYSIS, MICROSCOPIC ONLY
Glucose, UA: NEGATIVE mg/dL
Nitrite: NEGATIVE
Specific Gravity, Urine: 1.019 (ref 1.005–1.030)
pH: 5 (ref 5.0–8.0)

## 2010-08-16 LAB — BASIC METABOLIC PANEL
BUN: 28 mg/dL — ABNORMAL HIGH (ref 6–23)
CO2: 28 mEq/L (ref 19–32)
CO2: 29 mEq/L (ref 19–32)
Calcium: 8.3 mg/dL — ABNORMAL LOW (ref 8.4–10.5)
Chloride: 104 mEq/L (ref 96–112)
Creatinine, Ser: 1 mg/dL (ref 0.4–1.2)
Creatinine, Ser: 1.3 mg/dL — ABNORMAL HIGH (ref 0.4–1.2)
GFR calc non Af Amer: 40 mL/min — ABNORMAL LOW (ref >60)
Glucose, Bld: 115 mg/dL — ABNORMAL HIGH (ref 70–99)
Glucose, Bld: 189 mg/dL — ABNORMAL HIGH (ref 70–99)
Potassium: 4.2 mEq/L (ref 3.5–5.1)
Potassium: 4.4 mEq/L (ref 3.5–5.1)
Sodium: 139 mEq/L (ref 135–145)
Sodium: 139 mEq/L (ref 135–145)

## 2010-08-16 LAB — CARDIAC PANEL(CRET KIN+CKTOT+MB+TROPI)
CK, MB: 1.8 ng/mL (ref 0.3–4.0)
Relative Index: INVALID (ref 0.0–2.5)
Total CK: 93 U/L (ref 7–177)
Troponin I: 0.09 ng/mL — ABNORMAL HIGH (ref 0.00–0.06)
Troponin I: 0.09 ng/mL — ABNORMAL HIGH (ref 0.00–0.06)

## 2010-08-16 LAB — BLOOD GAS, ARTERIAL
Drawn by: 23588
O2 Content: 3 L/min
Patient temperature: 98.6
TCO2: 23.8 mmol/L (ref 0–100)
pCO2 arterial: 48.3 mmHg — ABNORMAL HIGH (ref 35.0–45.0)
pH, Arterial: 7.355 (ref 7.350–7.400)

## 2010-08-16 LAB — COMPREHENSIVE METABOLIC PANEL
Albumin: 2.8 g/dL — ABNORMAL LOW (ref 3.5–5.2)
BUN: 29 mg/dL — ABNORMAL HIGH (ref 6–23)
Calcium: 8.4 mg/dL (ref 8.4–10.5)
Chloride: 99 mEq/L (ref 96–112)
Creatinine, Ser: 1.32 mg/dL — ABNORMAL HIGH (ref 0.4–1.2)
Total Bilirubin: 1 mg/dL (ref 0.3–1.2)

## 2010-08-16 LAB — GLUCOSE, CAPILLARY
Glucose-Capillary: 150 mg/dL — ABNORMAL HIGH (ref 70–99)
Glucose-Capillary: 151 mg/dL — ABNORMAL HIGH (ref 70–99)
Glucose-Capillary: 166 mg/dL — ABNORMAL HIGH (ref 70–99)
Glucose-Capillary: 204 mg/dL — ABNORMAL HIGH (ref 70–99)
Glucose-Capillary: 207 mg/dL — ABNORMAL HIGH (ref 70–99)
Glucose-Capillary: 247 mg/dL — ABNORMAL HIGH (ref 70–99)
Glucose-Capillary: 277 mg/dL — ABNORMAL HIGH (ref 70–99)
Glucose-Capillary: 368 mg/dL — ABNORMAL HIGH (ref 70–99)

## 2010-08-16 LAB — DIFFERENTIAL
Basophils Relative: 0 % (ref 0–1)
Eosinophils Absolute: 0 10*3/uL (ref 0.0–0.7)
Eosinophils Relative: 0 % (ref 0–5)
Lymphocytes Relative: 15 % (ref 12–46)
Lymphocytes Relative: 7 % — ABNORMAL LOW (ref 12–46)
Lymphs Abs: 0.8 10*3/uL (ref 0.7–4.0)
Lymphs Abs: 1.1 10*3/uL (ref 0.7–4.0)
Monocytes Absolute: 0.6 10*3/uL (ref 0.1–1.0)
Monocytes Absolute: 0.7 10*3/uL (ref 0.1–1.0)
Monocytes Relative: 6 % (ref 3–12)
Monocytes Relative: 9 % (ref 3–12)
Neutro Abs: 5.2 10*3/uL (ref 1.7–7.7)
Neutrophils Relative %: 73 % (ref 43–77)

## 2010-08-16 LAB — CK TOTAL AND CKMB (NOT AT ARMC)
CK, MB: 3 ng/mL (ref 0.3–4.0)
Relative Index: 1.4 (ref 0.0–2.5)
Total CK: 216 U/L — ABNORMAL HIGH (ref 7–177)

## 2010-08-16 LAB — PROTIME-INR
INR: 0.9 (ref 0.00–1.49)
Prothrombin Time: 12.1 seconds (ref 11.6–15.2)
Prothrombin Time: 14.7 seconds (ref 11.6–15.2)

## 2010-08-16 LAB — DIGOXIN LEVEL: Digoxin Level: 0.5 ng/mL — ABNORMAL LOW (ref 0.8–2.0)

## 2010-08-16 LAB — URINALYSIS, ROUTINE W REFLEX MICROSCOPIC
Bilirubin Urine: NEGATIVE
Ketones, ur: NEGATIVE mg/dL
Nitrite: NEGATIVE
Protein, ur: 100 mg/dL — AB
pH: 6 (ref 5.0–8.0)

## 2010-08-16 LAB — CULTURE, BLOOD (ROUTINE X 2): Culture: NO GROWTH

## 2010-08-16 LAB — URINE MICROSCOPIC-ADD ON

## 2010-08-16 LAB — POCT CARDIAC MARKERS
CKMB, poc: 2.7 ng/mL (ref 1.0–8.0)
Troponin i, poc: <0.05 ng/mL (ref 0.00–0.09)

## 2010-08-16 LAB — APTT: aPTT: 25 seconds (ref 24–37)

## 2010-08-16 LAB — CHROMOSOME ANALYSIS, BONE MARROW

## 2010-08-16 LAB — TSH: TSH: 0.783 u[IU]/mL (ref 0.350–4.500)

## 2010-08-16 LAB — PHOSPHORUS: Phosphorus: 4.6 mg/dL (ref 2.3–4.6)

## 2010-08-16 LAB — TROPONIN I: Troponin I: 0.09 ng/mL — ABNORMAL HIGH (ref 0.00–0.06)

## 2010-08-17 ENCOUNTER — Ambulatory Visit (INDEPENDENT_AMBULATORY_CARE_PROVIDER_SITE_OTHER): Payer: Medicare Other | Admitting: *Deleted

## 2010-08-17 DIAGNOSIS — Z7901 Long term (current) use of anticoagulants: Secondary | ICD-10-CM

## 2010-08-17 DIAGNOSIS — I6350 Cerebral infarction due to unspecified occlusion or stenosis of unspecified cerebral artery: Secondary | ICD-10-CM

## 2010-08-17 DIAGNOSIS — I238 Other current complications following acute myocardial infarction: Secondary | ICD-10-CM

## 2010-08-17 DIAGNOSIS — I4891 Unspecified atrial fibrillation: Secondary | ICD-10-CM

## 2010-08-17 LAB — POCT INR: INR: 2.6

## 2010-08-21 ENCOUNTER — Telehealth (INDEPENDENT_AMBULATORY_CARE_PROVIDER_SITE_OTHER): Payer: Medicare Other | Admitting: Internal Medicine

## 2010-08-21 ENCOUNTER — Other Ambulatory Visit: Payer: Self-pay | Admitting: Internal Medicine

## 2010-08-21 DIAGNOSIS — IMO0001 Reserved for inherently not codable concepts without codable children: Secondary | ICD-10-CM

## 2010-08-21 DIAGNOSIS — I509 Heart failure, unspecified: Secondary | ICD-10-CM

## 2010-08-21 DIAGNOSIS — I10 Essential (primary) hypertension: Secondary | ICD-10-CM

## 2010-08-21 MED ORDER — TORSEMIDE 20 MG PO TABS: 30.0000 mg | ORAL_TABLET | Freq: Two times a day (BID) | ORAL | Status: DC

## 2010-08-21 NOTE — Telephone Encounter (Signed)
Refill- torsemide 20mg  tab. Take 1 and 1/2 tablets by mouth twice a day. Qty 90. Last fill 2.7.12

## 2010-08-21 NOTE — Telephone Encounter (Signed)
Call placed to patient at [phone], verification received from patient that she is still taking  30mg  daily. Refill sent to pharmacy

## 2010-08-22 ENCOUNTER — Other Ambulatory Visit (INDEPENDENT_AMBULATORY_CARE_PROVIDER_SITE_OTHER): Payer: Medicare Other

## 2010-08-22 ENCOUNTER — Telehealth (INDEPENDENT_AMBULATORY_CARE_PROVIDER_SITE_OTHER): Payer: Medicare Other | Admitting: Internal Medicine

## 2010-08-22 DIAGNOSIS — I10 Essential (primary) hypertension: Secondary | ICD-10-CM

## 2010-08-22 DIAGNOSIS — I509 Heart failure, unspecified: Secondary | ICD-10-CM

## 2010-08-22 LAB — BASIC METABOLIC PANEL
CO2: 30 mEq/L (ref 19–32)
Calcium: 8.7 mg/dL (ref 8.4–10.5)
Creatinine, Ser: 0.9 mg/dL (ref 0.4–1.2)
Glucose, Bld: 201 mg/dL — ABNORMAL HIGH (ref 70–99)

## 2010-08-22 LAB — HEMOGLOBIN A1C: Hgb A1c MFr Bld: 7.8 % — ABNORMAL HIGH (ref 4.6–6.5)

## 2010-08-22 LAB — BRAIN NATRIURETIC PEPTIDE: Pro B Natriuretic peptide (BNP): 318.7 pg/mL — ABNORMAL HIGH (ref 0.0–100.0)

## 2010-08-22 MED ORDER — ENALAPRIL MALEATE 10 MG PO TABS: 10.0000 mg | ORAL_TABLET | Freq: Every day | ORAL | Status: DC

## 2010-08-22 NOTE — Telephone Encounter (Signed)
Refill- enalapril 10mg  tab. Take one tablet by mouth every day. Qty 90. Last fill 10.18.11

## 2010-08-22 NOTE — Telephone Encounter (Signed)
rx refill for Enalapril sent to pharmacy

## 2010-08-23 ENCOUNTER — Telehealth (INDEPENDENT_AMBULATORY_CARE_PROVIDER_SITE_OTHER): Payer: Medicare Other | Admitting: Internal Medicine

## 2010-08-23 DIAGNOSIS — E039 Hypothyroidism, unspecified: Secondary | ICD-10-CM

## 2010-08-23 MED ORDER — LEVOTHYROXINE SODIUM 25 MCG PO TABS: 25.0000 ug | ORAL_TABLET | Freq: Every day | ORAL | Status: DC

## 2010-08-23 NOTE — Telephone Encounter (Signed)
rx refill sent to pharmacy x 1 patient is scheduled for follow up on 08/30/2010

## 2010-08-23 NOTE — Telephone Encounter (Signed)
Refill- levothyroxine sodium tab. Take 1 tablet by mouth once a day. Qty 30. Last fill 3.5.12.

## 2010-08-30 ENCOUNTER — Ambulatory Visit: Payer: Self-pay | Admitting: Internal Medicine

## 2010-08-31 ENCOUNTER — Encounter: Payer: Self-pay | Admitting: *Deleted

## 2010-08-31 ENCOUNTER — Encounter: Payer: Self-pay | Admitting: Internal Medicine

## 2010-08-31 ENCOUNTER — Ambulatory Visit (INDEPENDENT_AMBULATORY_CARE_PROVIDER_SITE_OTHER): Payer: Medicare Other | Admitting: Internal Medicine

## 2010-08-31 VITALS — BP 136/68 | HR 76 | Ht 64.0 in | Wt 260.0 lb

## 2010-08-31 DIAGNOSIS — I509 Heart failure, unspecified: Secondary | ICD-10-CM

## 2010-08-31 DIAGNOSIS — Z95810 Presence of automatic (implantable) cardiac defibrillator: Secondary | ICD-10-CM

## 2010-08-31 DIAGNOSIS — E785 Hyperlipidemia, unspecified: Secondary | ICD-10-CM

## 2010-08-31 DIAGNOSIS — I5022 Chronic systolic (congestive) heart failure: Secondary | ICD-10-CM

## 2010-08-31 DIAGNOSIS — I428 Other cardiomyopathies: Secondary | ICD-10-CM

## 2010-08-31 NOTE — Assessment & Plan Note (Signed)
Her device has reached elective replacement indication. I have discussed the risk, benefits, goals, and expectations of device generator change and she wishes to proceed.

## 2010-08-31 NOTE — Progress Notes (Signed)
HPI Destiny [name] returns today for followup. She is a 70 year old woman with a dilated cardiomyopathy and congestive heart failure. She is status post biventricular defibrillator insertion. She has reached elective replacement indication. She denies chest pain and peripheral edema. She has class II congestive heart failure. She has had no defibrillator shocks. Allergies  Allergen Reactions  . Sulfonamide Derivatives      Current Outpatient Prescriptions  Medication Sig Dispense Refill  . amLODipine (NORVASC) 5 MG tablet Take 5 mg by mouth daily.        Marland Kitchen aspirin 81 MG tablet Take 81 mg by mouth daily.        . Calcium 600-200 MG-UNIT per tablet Take 1 tablet by mouth daily.        . carvedilol (COREG) 25 MG tablet Take 50 mg by mouth 2 (two) times daily with meals.        . Cholecalciferol (VITAMIN D) 1000 UNITS capsule Take 1,000 Units by mouth daily.        Marland Kitchen docusate sodium (COLACE) 100 MG capsule Take 100 mg by mouth 2 (two) times daily.        . enalapril (VASOTEC) 10 MG tablet Take 1 tablet (10 mg total) by mouth daily.  30 tablet  3  . fentaNYL (DURAGESIC - DOSED MCG/HR) 25 MCG/HR Place 1 patch onto the skin every third day.        . gabapentin (NEURONTIN) 300 MG capsule Take 300 mg by mouth daily as needed.        . insulin NPH (NOVOLIN N) 100 UNIT/ML injection Inject into the skin. Inject 40 units subcutaneously every morning and 30 units every evening.       . insulin regular (HUMULIN R) 100 UNIT/ML injection Inject 20-30 Units into the skin 2 (two) times daily before a meal.        . Insulin Syringe-Needle U-100 (B-D INS SYR ULTRAFINE 1CC/30G) 30G X 1/2" 1 ML MISC Use for insulin injection four times a day (250.02)       . levothyroxine (SYNTHROID, LEVOTHROID) 25 MCG tablet Take 1 tablet (25 mcg total) by mouth daily.  30 tablet  0  . LORazepam (ATIVAN) 0.5 MG tablet Take 1 tablet (0.5 mg total) by mouth at bedtime.  30 tablet  3  . Multiple Vitamins-Minerals (MULTIVITAMIN WITH  MINERALS) tablet Take 1 tablet by mouth daily.        . NON FORMULARY 3.5 L by Nasal route continuous. oxygen       . Omega-3 Fatty Acids (FISH OIL) 1200 MG CAPS Take 2 capsules by mouth 2 (two) times daily.        . rosuvastatin (CRESTOR) 10 MG tablet Take 10 mg by mouth daily.        Marland Kitchen torsemide (DEMADEX) 20 MG tablet Take 1.5 tablets (30 mg total) by mouth 2 (two) times daily.  90 tablet  3  . warfarin (COUMADIN) 5 MG tablet Take by mouth as directed.       Marland Kitchen DISCONTD: citalopram (CELEXA) 20 MG tablet Take 20 mg by mouth daily.           Past Medical History  Diagnosis Date  . ICD (implantable cardiac defibrillator) in place   . LBBB (left bundle branch block)   . Carotid bruit   . Dyslipidemia   . Hypertension   . CHF (congestive heart failure)   . Stroke   . Back pain, chronic   . Sleep apnea   .  Cardiomyopathy, ischemic   . Cough   . History of recurrent UTIs   . Cystitis, acute   . Rhinosinusitis   . Dysuria   . Thyroid disease     hypothyroidism  . 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 CRI (chronic renal insufficiency)   . 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 DDD (degenerative disc disease)     multilevel    ROS:   All systems reviewed and negative except as noted in the HPI.   Past Surgical History  Procedure Date  . Abdominal hysterectomy 1982  . Cholecystectomy 1970  . Coronary artery bypass graft 2003     Family History  Problem Relation Age of Onset  . Cancer    . Heart disease       History   Social History  . Marital Status: Married    Spouse Name: N/A    Number of Children: N/A  . Years of Education: N/A   Occupational History  . retired    Social History Main Topics  . Smoking status: Never Smoker   . Smokeless tobacco: Not on file  . Alcohol Use: No  . Drug Use: No  . Sexually Active:    Other Topics Concern  . Not on file   Social History Narrative    Regular Exercise:  No     BP 136/68  Pulse 76  Ht 5\' 4"  (1.626 m)  Wt 260 lb (117.935 kg)  BMI 44.63 kg/m2  Physical Exam:  Well appearing NAD HEENT: Unremarkable Neck:  No JVD, no thyromegally Lymphatics:  No adenopathy Back:  No CVA tenderness Lungs:  Clear. Well-healed defibrillator incision HEART:  Regular rate rhythm, no murmurs, no rubs, no clicks Abd:  Flat, positive bowel sounds, no organomegally, no rebound, no guarding Ext:  2 plus pulses, no edema, no cyanosis, no clubbing Skin:  No rashes no nodules Neuro:  CN II through XII intact, motor grossly intact  DEVICE  Normal device function.  See PaceArt for details. Device at [facility].  Assess/Plan:

## 2010-08-31 NOTE — Assessment & Plan Note (Signed)
She will continue her current medications. She will maintain a low-fat diet.

## 2010-08-31 NOTE — Patient Instructions (Signed)
See instruction sheet for generator change instructions.

## 2010-08-31 NOTE — Assessment & Plan Note (Signed)
Her symptoms are well controlled. We'll continue her current medications. Will maintain a low-sodium diet.

## 2010-09-01 ENCOUNTER — Encounter: Payer: Self-pay | Admitting: Pulmonary Disease

## 2010-09-01 LAB — CBC WITH DIFFERENTIAL/PLATELET
Basophils Absolute: 0.1 10*3/uL (ref 0.0–0.1)
HCT: 37.7 % (ref 36.0–46.0)
Lymphs Abs: 1.7 10*3/uL (ref 0.7–4.0)
MCV: 89 fl (ref 78.0–100.0)
Monocytes Absolute: 0.6 10*3/uL (ref 0.1–1.0)
Platelets: 191 10*3/uL (ref 150.0–400.0)
RDW: 14.3 % (ref 11.5–14.6)

## 2010-09-01 LAB — BASIC METABOLIC PANEL
Calcium: 8.9 mg/dL (ref 8.4–10.5)
GFR: 54.8 mL/min — ABNORMAL LOW (ref >60.00)
Sodium: 139 mEq/L (ref 135–145)

## 2010-09-01 LAB — PROTIME-INR: INR: 3.9 ratio — ABNORMAL HIGH (ref 0.8–1.0)

## 2010-09-04 ENCOUNTER — Encounter: Payer: Self-pay | Admitting: Internal Medicine

## 2010-09-04 ENCOUNTER — Telehealth: Payer: Self-pay | Admitting: Internal Medicine

## 2010-09-04 ENCOUNTER — Ambulatory Visit (INDEPENDENT_AMBULATORY_CARE_PROVIDER_SITE_OTHER): Payer: Medicare Other | Admitting: Internal Medicine

## 2010-09-04 DIAGNOSIS — E785 Hyperlipidemia, unspecified: Secondary | ICD-10-CM

## 2010-09-04 DIAGNOSIS — I10 Essential (primary) hypertension: Secondary | ICD-10-CM

## 2010-09-04 NOTE — Telephone Encounter (Signed)
Destiny [name] is going to have pt come back in for a repeat INR

## 2010-09-04 NOTE — Patient Instructions (Addendum)
Take 1/2 dose of NPH before pacemaker procedure Please complete the following lab tests before your next follow up appointment: BMET, A1c - 250.02 TSH - 244.9

## 2010-09-04 NOTE — Progress Notes (Signed)
Subjective:    [name], female    DOB: 30-Mar-1941, 70 y.o.   MRN: [id number]  HPI  70 year old female for followup regarding type 2 diabetes.  Her blood sugar control has slightly worsened due to poor dietary compliance. Patient denies significant hypoglycemia. No change to her insulin regimen.  In march, she experienced "spell".  Pt told pacer paced out her rhythm.  Due for pacer change this Thursday  Review of Systems Negative for chest pain.  No weight gain   Past Medical History  Diagnosis Date  . ICD (implantable cardiac defibrillator) in place   . LBBB (left bundle branch block)   . Carotid bruit   . Dyslipidemia   . Hypertension   . CHF (congestive heart failure)   . Stroke   . Back pain, chronic   . Sleep apnea   . Cardiomyopathy, ischemic   . Cough   . History of recurrent UTIs   . Cystitis, acute   . Rhinosinusitis   . Dysuria   . Thyroid disease     hypothyroidism  . 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 CRI (chronic renal insufficiency)   . 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 DDD (degenerative disc disease)     multilevel    History   Social History  . Marital Status: Married    Spouse Name: N/A    Number of Children: Y  . Years of Education: N/A   Occupational History  . 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    Social History Main Topics  . Smoking status: Never Smoker   . Smokeless tobacco: Not on file  . Alcohol Use: No  . Drug Use: No  . Sexually Active: Not on file   Other Topics Concern  . Not on file   Social History Narrative   Regular Exercise:  No    Past Surgical History  Procedure Date  . Abdominal hysterectomy 1982  . Cholecystectomy 1970  . Coronary artery bypass graft 2003  . Tubal ligation     Family History  Problem Relation Age of Onset  . Cancer Mother     kidney and female repo  . Heart disease Father   . Asthma Mother      Allergies  Allergen Reactions  . Sulfonamide Derivatives     Current Outpatient Prescriptions on File Prior to Visit  Medication Sig Dispense Refill  . amLODipine (NORVASC) 5 MG tablet Take 5 mg by mouth daily.        Marland Kitchen aspirin 81 MG tablet Take 81 mg by mouth daily.        . Calcium 600-200 MG-UNIT per tablet Take 1 tablet by mouth daily.        . carvedilol (COREG) 25 MG tablet Take 50 mg by mouth 2 (two) times daily with meals.        . Cholecalciferol (VITAMIN D) 1000 UNITS capsule Take 1,000 Units by mouth daily.        . enalapril (VASOTEC) 10 MG tablet Take 1 tablet (10 mg total) by mouth daily.  30 tablet  3  . fentaNYL (DURAGESIC - DOSED MCG/HR) 25 MCG/HR Place 1 patch onto the skin every 3 (three) days. Current 50 every 3 days tapering to 25 next month and then discontinued      . insulin NPH (NOVOLIN N) 100 UNIT/ML injection  Inject 30 Units into the skin 2 (two) times daily.       . insulin regular (HUMULIN R) 100 UNIT/ML injection Inject 30 Units into the skin 2 (two) times daily before a meal.       . Insulin Syringe-Needle U-100 (B-D INS SYR ULTRAFINE 1CC/30G) 30G X 1/2" 1 ML MISC Use for insulin injection four times a day (250.02)       . levothyroxine (SYNTHROID, LEVOTHROID) 25 MCG tablet Take 1 tablet (25 mcg total) by mouth daily.  30 tablet  0  . LORazepam (ATIVAN) 0.5 MG tablet Take 1 tablet (0.5 mg total) by mouth at bedtime.  30 tablet  3  . Multiple Vitamins-Minerals (MULTIVITAMIN WITH MINERALS) tablet Take 1 tablet by mouth daily.        . Omega-3 Fatty Acids (FISH OIL) 1200 MG CAPS Take 2 capsules by mouth 2 (two) times daily.        . rosuvastatin (CRESTOR) 10 MG tablet Take 10 mg by mouth daily.        Marland Kitchen torsemide (DEMADEX) 20 MG tablet Take 1.5 tablets (30 mg total) by mouth 2 (two) times daily.  90 tablet  3  . warfarin (COUMADIN) 5 MG tablet Take by mouth as directed. 09/04/2010 on hold until after surgery      . docusate sodium (COLACE) 100 MG capsule Take  100 mg by mouth 2 (two) times daily as needed.         BP 120/60  Pulse 77  Temp(Src) 98 F (36.7 C) (Oral)  Resp 20  Wt 261 lb (118.389 kg)  SpO2 95%       Objective:   Physical Exam     Constitutional: Pleasant  70 year old female Cardiovascular: Normal rate, regular rhythm and normal heart sounds.  Exam reveals no gallop and no friction rub.   No murmur heard.  no lower extremity edema Pulmonary/Chest: Effort normal and breath sounds normal.  No wheezes. No rales.  Abdominal: Soft. Bowel sounds are normal. No mass. There is no tenderness.  Neurological: Alert. No cranial nerve deficit.  Skin: Skin is warm and dry.  Psychiatric: Normal mood and affect. Behavior is normal.      Assessment & Plan:

## 2010-09-04 NOTE — Telephone Encounter (Signed)
Call placed to Dr Ladona Ridgel office at [phone] with Destiny [name], message was left with her for Dr Bruna Potter nurse to return call, regarding INR. Per patient report she last her her INR checked on 4/19  At 3.9- she is currently holding her coumadin, and she is scheduled for pacemaker replacement on Thursday 4/26. Dr Artist Pais would like to know if patient should have her INR rechecked before she has the pacemaker replaced

## 2010-09-04 NOTE — Telephone Encounter (Signed)
Spoke with Darlene  Pt held her Coumadin yesterday on her own.  Weston Brass, Pharm D spoke with pt and asked to restart he Coumadin

## 2010-09-04 NOTE — Telephone Encounter (Signed)
Tresa Endo with Dr Bruna Potter office returned phone call and left voice message stating according to pharmacist Tomasa Rand, patient may have may the decision to hold the Coumadin on her own.  She stated the patient could have the INR performed the day before or the day of the procdure. Tresa Endo stated he in her message that she will contact the patient regarding blood work

## 2010-09-05 ENCOUNTER — Encounter: Payer: Self-pay | Admitting: Pulmonary Disease

## 2010-09-05 ENCOUNTER — Ambulatory Visit (INDEPENDENT_AMBULATORY_CARE_PROVIDER_SITE_OTHER): Payer: Medicare Other | Admitting: Pulmonary Disease

## 2010-09-05 VITALS — BP 130/60 | HR 84 | Temp 98.1°F | Ht 64.5 in | Wt 263.8 lb

## 2010-09-05 DIAGNOSIS — G4733 Obstructive sleep apnea (adult) (pediatric): Secondary | ICD-10-CM | POA: Insufficient documentation

## 2010-09-05 NOTE — Patient Instructions (Signed)
Please have your dme send me the paperwork for your new cpap machine, and I will be happy to sign Keep up with cpap mask changes and supplies during the year.  Work on weight loss followup with me in one year, but call if having sleep issues.

## 2010-09-05 NOTE — Progress Notes (Signed)
  Subjective:    [name], female    DOB: 07-30-1940, 70 y.o.   MRN: [id number]  HPI The pt is a 69y/o female who comes in today for management of osa.  She has known severe osa from 2003, where she was found to have AHI 41/hr.  She was started on cpap successfully, but I have not seen her since 2004.  She comes in today where she is in need of a new cpap machine.  She has received one from her dme, but needs cmn signed.  The pt feels she is sleeping well, and denies alertness issues during the day.  She is not having any mask or pressure tolerance issues.  Her weight is up 20 pounds since her last visit.  Her epworth score today is normal at 9.     Review of Systems  Constitutional: Negative for fever and unexpected weight change.  HENT: Positive for congestion. Negative for ear pain, nosebleeds, sore throat, rhinorrhea, sneezing, trouble swallowing, dental problem, postnasal drip and sinus pressure.   Eyes: Negative for redness and itching.  Respiratory: Negative for cough, chest tightness, shortness of breath and wheezing.   Cardiovascular: Positive for leg swelling. Negative for palpitations.  Gastrointestinal: Negative for nausea and vomiting.  Genitourinary: Negative for dysuria.  Musculoskeletal: Positive for joint swelling.  Skin: Negative for rash.  Neurological: Negative for headaches.  Hematological: Does not bruise/bleed easily.  Psychiatric/Behavioral: Negative for dysphoric mood. The patient is not nervous/anxious.        Objective:   Physical Exam Constitutional:  Obese female, no acute distress  HENT:  Nares patent without discharge  Oropharynx without exudate, palate and uvula significantly elongated with narrowing  Eyes:  Perrla, eomi, no scleral icterus  Neck:  No JVD, no TMG  Cardiovascular:  Normal rate, regular rhythm, no rubs or gallops.  No murmurs        Intact distal pulses but decreased  Pulmonary :  Normal breath sounds, no stridor or  respiratory distress   No rales, rhonchi, or wheezing  Abdominal:  Soft, nondistended, bowel sounds present.  No tenderness noted.   Musculoskeletal: mild LE edema noted  Lymph Nodes:  No cervical lymphadenopathy noted  Skin:  No cyanosis noted  Neurologic:  Alert, appropriate, moves all 4 extremities without obvious deficit.         Assessment & Plan:

## 2010-09-05 NOTE — Assessment & Plan Note (Signed)
The pt has a history of severe osa, and has been on cpap successfully since starting.  She has just had her machine replaced, and feels she is doing well with it.  She is sleeping well, and denies significant EDS.  Her weight has gone up 20 pounds since her last titration, and I have told her if it goes up any further she will need an auto titration study at home to re-optimize her pressure.  Will sign for her new cpap machine, and she is to f/u with me in one year.

## 2010-09-06 ENCOUNTER — Ambulatory Visit (INDEPENDENT_AMBULATORY_CARE_PROVIDER_SITE_OTHER): Payer: Medicare Other | Admitting: *Deleted

## 2010-09-06 DIAGNOSIS — I6350 Cerebral infarction due to unspecified occlusion or stenosis of unspecified cerebral artery: Secondary | ICD-10-CM

## 2010-09-06 DIAGNOSIS — I4891 Unspecified atrial fibrillation: Secondary | ICD-10-CM

## 2010-09-06 DIAGNOSIS — I238 Other current complications following acute myocardial infarction: Secondary | ICD-10-CM

## 2010-09-06 LAB — POCT INR: INR: 1.9

## 2010-09-07 ENCOUNTER — Ambulatory Visit (HOSPITAL_COMMUNITY)
Admission: RE | Admit: 2010-09-07 | Discharge: 2010-09-07 | Disposition: A | Discharge status: Home / Self Care | Payer: Medicare Other | Source: Ambulatory Visit | Attending: Internal Medicine | Admitting: Internal Medicine

## 2010-09-07 DIAGNOSIS — I428 Other cardiomyopathies: Secondary | ICD-10-CM | POA: Insufficient documentation

## 2010-09-07 DIAGNOSIS — I509 Heart failure, unspecified: Secondary | ICD-10-CM | POA: Insufficient documentation

## 2010-09-07 DIAGNOSIS — Z4502 Encounter for adjustment and management of automatic implantable cardiac defibrillator: Secondary | ICD-10-CM | POA: Insufficient documentation

## 2010-09-07 LAB — GLUCOSE, CAPILLARY: Glucose-Capillary: 92 mg/dL (ref 70–99)

## 2010-09-07 LAB — SURGICAL PCR SCREEN: Staphylococcus aureus: POSITIVE — AB

## 2010-09-11 NOTE — Op Note (Signed)
  NAME:  Destiny [name], Destiny [name]               ACCOUNT NO.:  [id number]  MEDICAL RECORD NO.:  [id number]           PATIENT TYPE:  O  LOCATION:  [facility]                         FACILITY:  [facility]  PHYSICIAN:  Doylene Canning. Ladona Ridgel, MD    DATE OF BIRTH:  14-Aug-1940  DATE OF PROCEDURE:  09/07/2010 DATE OF DISCHARGE:  09/07/2010                              OPERATIVE REPORT   PROCEDURE PERFORMED:  Removal of a previously implanted biventricular ICD and insertion of a new biventricular ICD.  INDICATIONS:  Status post BiV ICD with the device now at [facility].  The patient has underlying dilated cardiomyopathy class III, congestive heart failure.  INTRODUCTION:  The patient is a 70 year old woman with a history of longstanding dilated cardiomyopathy despite maximal medical therapy. She underwent biventricular ICD insertion approximately 4 years ago.  At that time, her heart failure went from class III to class II.  She has now reached elective replacement indication on her device and is referred for a new BiV device.  PROCEDURE:  After informed consent was obtained, the patient was taken to diagnostic EP lab in a fasting state.  After usual preparation and draping, intravenous fentanyl and midazolam were given for sedation.  30 mL of lidocaine was infiltrated into the left infraclavicular region.  A 6-cm incision was carried out over this region, and electrocautery was utilized to dissect down to the fascial plane.  The ICD pocket was then entered with electrocautery.  The device was removed with gentle traction.  The leads were evaluated and found be working satisfactorily. The old BiV device was removed, and the new [employer] Unify BiV ICD serial number [id number] was connected to the old atrial RV and LV leads and placed back in the subcutaneous pocket.  The pocket was irrigated with antibiotic irrigation.  Electrocautery was utilized to assure hemostasis, and the patient was deeply sedated for  defibrillation threshold testing.  After the patient was more deeply sedated with fentanyl and Versed, VF was induced with T-wave shock.  A 17-1/2 joule shock was then delivered, which terminated VF and restored sinus rhythm.  At this point, no additional defibrillation threshold testing was carried out, and the incision was closed with 2-0 and 3-0 Vicryl.  Benzoin and Steri-Strips were painted on the skin, pressure dressing was applied, and the patient was returned to her room in satisfactory condition.  COMPLICATIONS:  There were no immediate procedure complications.  RESULTS:  Demonstrate successful removal of previously implanted BiV ICD, which had reached elective replacement, and insertion of a new BiV ICD with defibrillation threshold testing.     Doylene Canning. Ladona Ridgel, MD     GWT/MEDQ  D:  09/07/2010  T:  09/07/2010  Job:  [id number]  Electronically Signed by Lewayne Bunting MD on 09/11/2010 08:00:07 AM

## 2010-09-12 ENCOUNTER — Telehealth: Payer: Self-pay | Admitting: Internal Medicine

## 2010-09-12 NOTE — Telephone Encounter (Signed)
Received refill requests from [facility] on [address]. Previous rxs went to [facility].  Left message for pt to return my call. Need to verify which pharmacy pt is using.

## 2010-09-12 NOTE — Telephone Encounter (Signed)
Refill-amlodipine besylate 5mg  tab. Take 1 tablet by mouth once a day. Qty 90. Last fill 2.6.12

## 2010-09-12 NOTE — Telephone Encounter (Signed)
Refill- enalapril 10mg tab. Take one tablet by mouth every day. Qty 90. Last fill 10.18.11 °

## 2010-09-13 ENCOUNTER — Telehealth: Payer: Self-pay | Admitting: Internal Medicine

## 2010-09-13 NOTE — Telephone Encounter (Signed)
Refill request from [facility] [place] for enalapril 10mg  tab take 1 tab daily last fill 02-28-2010 qty 90

## 2010-09-13 NOTE — Telephone Encounter (Signed)
Ok for refills at requested pharmacies

## 2010-09-13 NOTE — Telephone Encounter (Signed)
Call placed to [facility] pharmacy at [phone] spoke with  Summer, she states patient has a Rx ready there for pick up for Citalopram. Spoke with the pharmacist he was informed patient has the requested refill Amlodipine and Enalapril sent to another pharmacy. Pharmacist is aware requested refills were not authorized for refill.   Call was placed to patient she was informed Rx for Citalopram was ready for pick up at [facility] pharmacy. Patient verbalized understanding and she does not need any additional medications filled at this time

## 2010-09-13 NOTE — Telephone Encounter (Signed)
Pt returned my call and verified that she does not get enalapril or amlodipine at [facility], still has refills on file at [facility]. Pt states she contacted [facility] about refilling her Celexa which still has refills on file. Pt states she told us she was not taking this at her last visit but was confused and still takes medication. Med has been added back to medication list.

## 2010-09-14 ENCOUNTER — Encounter: Payer: Medicare Other | Admitting: *Deleted

## 2010-09-14 ENCOUNTER — Telehealth: Payer: Self-pay | Admitting: Pulmonary Disease

## 2010-09-14 NOTE — Telephone Encounter (Signed)
Please advise Alida if you have CMN on pt. Thanks  Mindy Silva, CMA  

## 2010-09-15 NOTE — Telephone Encounter (Signed)
Spoke with [facility], still waiting for the blank CMN which was not faxed, I do have the form for supplies.  She said she will fax today .Kandice Hams

## 2010-09-18 ENCOUNTER — Ambulatory Visit (INDEPENDENT_AMBULATORY_CARE_PROVIDER_SITE_OTHER): Payer: Medicare Other | Admitting: *Deleted

## 2010-09-18 DIAGNOSIS — I238 Other current complications following acute myocardial infarction: Secondary | ICD-10-CM

## 2010-09-18 DIAGNOSIS — I4891 Unspecified atrial fibrillation: Secondary | ICD-10-CM

## 2010-09-18 DIAGNOSIS — Z95810 Presence of automatic (implantable) cardiac defibrillator: Secondary | ICD-10-CM

## 2010-09-18 DIAGNOSIS — I6350 Cerebral infarction due to unspecified occlusion or stenosis of unspecified cerebral artery: Secondary | ICD-10-CM

## 2010-09-18 DIAGNOSIS — I428 Other cardiomyopathies: Secondary | ICD-10-CM

## 2010-09-18 DIAGNOSIS — I509 Heart failure, unspecified: Secondary | ICD-10-CM

## 2010-09-18 LAB — POCT INR: INR: 1.9

## 2010-09-18 NOTE — Progress Notes (Signed)
icd check in clinic  

## 2010-09-19 ENCOUNTER — Telehealth (INDEPENDENT_AMBULATORY_CARE_PROVIDER_SITE_OTHER): Payer: Medicare Other | Admitting: Internal Medicine

## 2010-09-19 DIAGNOSIS — E039 Hypothyroidism, unspecified: Secondary | ICD-10-CM

## 2010-09-19 NOTE — Telephone Encounter (Signed)
Refill- levothyroxine sodium tab. Take 1 tablet by mouth once a day. Qty 30. Last fill 4.11.12  Refill- novolin n 1000/ml inj ml. Inject subcutaneously 40 units every morning and 30 units every evening. Qty 30. Last fill 4.9.12

## 2010-09-19 NOTE — Assessment & Plan Note (Signed)
Lab Results  Component Value Date   CHOL 202* 03/15/2010   CHOL  Value: 357        ATP III CLASSIFICATION:  <200     mg/dL   Desirable  098-119  mg/dL   Borderline High  >=147    mg/dL   High       * 12/13/9560   CHOL  Value: 150        ATP III CLASSIFICATION:  <200     mg/dL   Desirable  130-865  mg/dL   Borderline High  >=784    mg/dL   High        6/96/2952   Lab Results  Component Value Date   HDL 41.40 03/15/2010   HDL 58 12/20/2009   HDL 46 11/07/2009   Lab Results  Component Value Date   LDLCALC  Value: UNABLE TO CALCULATE IF TRIGLYCERIDE OVER 400 mg/dL        Total Cholesterol/HDL:CHD Risk Coronary Heart Disease Risk Table                     Men   Women  1/2 Average Risk   3.4   3.3  Average Risk       5.0   4.4  2 X Average Risk   9.6   7.1  3 X Average Risk  23.4   11.0        Use the calculated Patient Ratio above and the CHD Risk Table to determine the patient's CHD Risk.        ATP III CLASSIFICATION (LDL):  <100     mg/dL   Optimal  841-324  mg/dL   Near or Above                    Optimal  130-159  mg/dL   Borderline  401-027  mg/dL   High  >253     mg/dL   Very High 10/17/4401   LDLCALC  Value: 62        Total Cholesterol/HDL:CHD Risk Coronary Heart Disease Risk Table                     Men   Women  1/2 Average Risk   3.4   3.3  Average Risk       5.0   4.4  2 X Average Risk   9.6   7.1  3 X Average Risk  23.4   11.0        Use the calculated Patient Ratio above and the CHD Risk Table to determine the patient's CHD Risk.        ATP III CLASSIFICATION (LDL):  <100     mg/dL   Optimal  474-259  mg/dL   Near or Above                    Optimal  130-159  mg/dL   Borderline  563-875  mg/dL   High  >643     mg/dL   Very High 08/10/5186   LDLCALC 4 04/22/2006   Lab Results  Component Value Date   TRIG 418.0 Triglyceride is over 400; calculations on Lipids are invalid. mg/dL* 41/10/6061   TRIG 016* 12/20/2009   TRIG 211* 11/07/2009   Lab Results  Component Value Date   CHOLHDL 5 03/15/2010   CHOLHDL 6.2 12/20/2009   CHOLHDL 3.3 11/07/2009   Lab Results  Component Value Date  LDLDIRECT 83.9 03/15/2010   LDLDIRECT 91.9 12/14/2008   LDLDIRECT 27.5 09/17/2008   Continue current medication regimen.

## 2010-09-19 NOTE — Assessment & Plan Note (Signed)
Well controlled.  No change in meds  BP: 120/60 mmHg   Lab Results  Component Value Date   MICROALBUR 91.7* 09/06/2009   Lab Results  Component Value Date   CREATININE 1.1 08/31/2010

## 2010-09-19 NOTE — Assessment & Plan Note (Signed)
Patient has reasonable blood sugar control. Encouraged improved dietary compliance. Se is not having significant hypoglycemia.  No change to insulin regimen  Lab Results  Component Value Date   HGBA1C 7.8* 08/22/2010

## 2010-09-20 MED ORDER — LEVOTHYROXINE SODIUM 25 MCG PO TABS: 25.0000 ug | ORAL_TABLET | Freq: Every day | ORAL | Status: DC

## 2010-09-20 MED ORDER — INSULIN NPH (HUMAN) (ISOPHANE) 100 UNIT/ML ~~LOC~~ SUSP: 30.0000 [IU] | Freq: Two times a day (BID) | SUBCUTANEOUS | Status: DC

## 2010-09-20 NOTE — Telephone Encounter (Signed)
Rx refills sent to pharmacy,patient aware

## 2010-09-26 ENCOUNTER — Other Ambulatory Visit: Payer: Self-pay | Admitting: Oncology

## 2010-09-26 ENCOUNTER — Other Ambulatory Visit: Payer: Self-pay | Admitting: Internal Medicine

## 2010-09-26 ENCOUNTER — Other Ambulatory Visit: Payer: Self-pay | Admitting: Cardiology

## 2010-09-26 ENCOUNTER — Encounter (HOSPITAL_BASED_OUTPATIENT_CLINIC_OR_DEPARTMENT_OTHER): Payer: Medicare Other | Admitting: Oncology

## 2010-09-26 ENCOUNTER — Other Ambulatory Visit: Payer: Self-pay

## 2010-09-26 DIAGNOSIS — C8595 Non-Hodgkin lymphoma, unspecified, lymph nodes of inguinal region and lower limb: Secondary | ICD-10-CM

## 2010-09-26 LAB — CBC WITH DIFFERENTIAL/PLATELET
BASO%: 0.2 % (ref 0.0–2.0)
EOS%: 3.6 % (ref 0.0–7.0)
HGB: 13 g/dL (ref 11.6–15.9)
MCH: 30.3 pg (ref 25.1–34.0)
MCHC: 34.4 g/dL (ref 31.5–36.0)
RDW: 14.2 % (ref 11.2–14.5)
lymph#: 1.5 10*3/uL (ref 0.9–3.3)

## 2010-09-26 MED ORDER — WARFARIN SODIUM 5 MG PO TABS: ORAL_TABLET | ORAL | Status: DC

## 2010-09-26 NOTE — Telephone Encounter (Signed)
Rx refill sent to pharmacy. 

## 2010-09-26 NOTE — Telephone Encounter (Signed)
Pt states that pharmacy told her that we would not refill warfarin. I looked in previous phone notes but could not find anything about that med as to why we would not refill. Pt states she only has 2 pills left of warfarin. Please assist.

## 2010-09-26 NOTE — Assessment & Plan Note (Signed)
[facility]                          [facility] OFFICE NOTE   NAME:Destiny [name], Destiny [name]                      MRN:          [id number]  DATE:01/30/2008                            DOB:          [birth year]    Destiny [name] is a very pleasant 70 year old female who has a history of  severe ischemic cardiomyopathy with previous ejection fraction of 20%  improved to 55-65% following biventricular/ICD implantation.  The last  echo was performed on August 05, 2006.  At that time, the ejection  fraction was 55-60%.  There was trivial mitral regurgitation.  She also  has cerebrovascular disease.  Her last carotid Dopplers on May 28, 2007, showed 40-59% bilateral internal carotid artery stenosis.  She is  also followed by Dr. Ladona Ridgel for her ICD/BiV pacer.  Since she was last  seen, she denies any dyspnea on exertion, orthopnea, PND, pedal edema,  palpitations, presyncope, syncope, or chest pain.  She has had some  problems with pain in her left knee with a small effusion by her report.   MEDICATIONS:  1. Enalapril 10 mg p.o. daily.  2. Coreg 50 mg p.o. b.i.d.  3. Lasix 80 mg p.o. daily.  4. Digoxin 0.125 mg p.o. daily.  5. Fenofibrate 200 mg p.o. daily.  6. Norvasc 5 mg daily.  7. Coumadin as directed and followed in our [facility].  8. Neurontin 300 mg p.o. b.i.d.  9. Aspirin 81 mg p.o. daily.  10.She also takes Crestor 10 mg p.o. daily.  11.Vitamin D.  12.Insulin.  13.Apidia 35 units t.i.d.  14.Citalopram.   PHYSICAL EXAMINATION:  VITAL SIGNS:  Blood pressure 110/57, her pulse is  81.  She weighs 242 pounds.  HEENT:  Normal.  NECK:  Supple.  CHEST:  Clear.  CARDIOVASCULAR:  Regular rate.  ABDOMEN:  Shows no tenderness.  EXTREMITIES:  Show no edema.  She does have an effusion in her left  knee.   DIAGNOSIS:  1. Coronary artery disease - the patient is having no chest pain.      However, it has been approximately 6 years since her  bypass      surgery.  She was scheduled previously to have a Myoview by [employer].  However, she did not show for this as she apparently has a      problem of claustrophobia.  She is agreeable to proceed with a      Myoview and we will give Valium 5 mg prior to the study to see if      she will tolerate.  If it shows normal perfusion, then we will not      need to pursue further ischemia evaluation.  She will continue on      her aspirin, angiotensin-converting enzyme inhibitor, beta-blocker,      and statin.  2. History of ischemic cardiomyopathy, improved by most recent      echocardiogram - she will continue on her present medications.      Note, Dr. Artist Pais is following her laboratories including her      cholesterol,  liver, renal function, and CBC.  3. History of cardiac resynchronization therapy/automatic implantable      cardioverter-defibrillator - this is being managed by Dr. Ladona Ridgel.  4. History of left ventricular thrombus - she will continue on      Coumadin therapy.  5. Diabetes mellitus.  6. Hypertension - her blood pressure is adequately controlled.  7. Hyperlipidemia - she will continue on her statin as well as her      fenofibrate.  8. Obstructive sleep apnea.  9. History of chronic back pain.  10.Cerebrovascular disease - she will need followup carotid Dopplers      in January.   She will return to this office in 6 months.     Madolyn Frieze Jens Som, MD, [facility]  Electronically Signed    BSC/MedQ  DD: 01/30/2008  DT: 01/31/2008  Job #: [id number]

## 2010-09-26 NOTE — Assessment & Plan Note (Signed)
[facility]                          [facility] NOTE   NAME:Messinger, LIANE TRIBBEY                      MRN:          [id number]  DATE:05/29/2007                            DOB:          01/26/1941    CARDIOLOGIST:  Dr. Lewayne Bunting   ELECTROPHYSIOLOGIST:  Dr. Lewayne Bunting   PRIMARY CARE PHYSICIAN:  Dr. Artist Pais   REASON FOR VISIT:  Six-month followup.   HISTORY OF PRESENT ILLNESS:  Ms. Blatz is a very pleasant 70 year old  female patient with a history of severe ischemic cardiomyopathy with a  previous EF of 20% improved to 55-65% post biventricular/AICD  implantation.  She returns today for routine followup.  Overall, she is  doing well.  She denies any chest pain or significant shortness of  breath.  She describes NYHA class II to class IIb symptoms.  She denies  orthopnea, PND or pedal edema.  She denies any syncope.  She recently  had carotid Dopplers performed in our [facility] office upon the  recommendation of Dr. Artist Pais.  The results of this test are unknown at this  point in time.   MEDICATIONS:  1. Crestor 10 mg daily.  2. Enalapril 10 mg daily.  3. Coreg 25 mg two tablets b.i.d.  4. Prozac 20 mg daily.  5. Lasix 80 mg daily.  6. Digitek 0.125 mg daily.  7. Apidra 30 units three times a day.  8. Fenofibrate 200 mg daily.  9. Norvasc 5 mg daily.  10.Coumadin as directed.  11.Neurontin 300 mg b.i.d.  12.Aspirin 81 mg daily.  13.Lantus insulin 60 units b.i.d.   ALLERGIES:  SULFA.   PHYSICAL EXAMINATION:  She is a well-nourished, well-developed female.  She has blood pressure 121/85, pulse 60, oxygen saturation 93% on room  air.  HEENT:  Normal.  NECK:  Without JVD.  CARDIAC:  Normal S1, S2.  Regular rate and rhythm with a soft 1-2/6  systolic ejection murmur heard best along the left sternal border.  LUNGS:  Clear to auscultation bilaterally without wheezing, rhonchi or  rales.  ABDOMEN:  Soft, nontender, with normal active  bowel sounds, no  organomegaly.  EXTREMITIES:  Without edema.  CAROTIDS:  With questionable bilateral bruits bilaterally.   Electrocardiogram reveals underlying sinus rhythm, ventricular pacing,  ventricular rate of 61.   IMPRESSION:  1. Severe ischemic cardiomyopathy with previous ejection fraction of      20%.  Ejection fraction improved to 55-60% by echocardiogram March      2008.  2. Chronic systolic congestive heart failure, [place] Heart      Association class II to class IIb symptoms.  3. Status post cardiac resynchronization therapy/automatic implanted      cardioverter-defibrillator implantation March 2007.  4. Coronary disease status post coronary artery bypass grafting in      2003.  5. History of left ventricular thrombus; Coumadin therapy.  6. Diabetes mellitus type 2.  7. Hypertension.  8. Dyslipidemia.  9. Obstructive sleep apnea.  10.Chronic back pain.  11.Cerebrovascular disease.  Carotid Dopplers pending from May 28, 2007.  PLAN:  Ms. Pruden returns to the office today for followup.  Overall,  she is doing well from a cardiovascular standpoint.  It has been greater  than 5 years since her bypass surgery.  We will arrange routine  adenosine Cardiolite to assess graft patency.  She will return for  followup in 6 months or sooner p.r.n.  Will try to obtain the results of  her carotid Dopplers performed yesterday at our [facility] office.      Tereso Newcomer, PA-C  Electronically Signed      Learta Codding, MD,FACC  Electronically Signed   SW/MedQ  DD: 05/29/2007  DT: 05/29/2007  Job #: [id number]   cc:   Barbette Hair. Artist Pais, DO

## 2010-09-26 NOTE — Assessment & Plan Note (Signed)
[facility]                         ELECTROPHYSIOLOGY OFFICE NOTE   NAME:Cafarelli, KARYN BRULL                      MRN:          [id number]  DATE:11/17/2006                            DOB:          11/02/1940    Ms. Feuerborn returns today for follow-up.  She is a very pleasant, obese  middle-aged woman with a history of ischemic cardiomyopathy and  congestive heart failure, status post bi-V ICD insertion.  She returns  today for follow-up.  She denies chest pain or shortness of breath or  peripheral edema.  She also has a history of obesity and sleep apnea and  is on CPAP.  She has diabetes and hypertriglyceridemia.  She is  presently being followed by Dr. Artist Pais for this.   On exam today she is a pleasant but morbidly obese middle-aged woman in  no distress.  Blood pressure today was 121/50, the pulse 60 and regular, the  respirations were 18.  Weight was 235 pounds.  The neck revealed no jugular venous distention.  LUNGS:  Clear bilaterally to auscultation.  No wheezes, rales or rhonchi  were present.  The cardiovascular exam revealed a regular rate and rhythm with normal  S1 and S2.  The PMI was enlarged and laterally displaced.  The extremities demonstrated no edema.   Her medications include:  1. Coreg 50 mg twice a day.  2. Insulin.  3. Enalapril 10 mg daily.  4. Crestor 10 mg daily.  5. Warfarin as directed.  6. Fenofibrate.   Interrogation of her defibrillator demonstrates a St. Jude Atlas [phone]  with P waves greater than 3 and R waves greater than 12, the impedance  435 in the atrium and 490 in the right ventricle, 590 in the left  ventricle.  The threshold was 1.75 at 0.5 in the LV, 1 at 0.5 in the RV,  and 0.75 at 0.5 in the atrium.  There were no IC therapies.   IMPRESSION:  1. Ischemic cardiomyopathy.  2. Congestive heart failure.  3. Left bundle branch block.  4. Status post biventricular implantable cardioverter-defibrillator  insertion.   DISCUSSION:  Overall, Ms. Galanti is stable from an arrhythmia  perspective and from a heart failure perspective.  Her hemoglobin A1c I  note to be 11.  Her triglycerides were 880.  I have  discussed with her the importance of dietary compliance and medical  compliance with regard to her diabetes.  She will seek follow-up with  Dr. Artist Pais.     Doylene Canning. Ladona Ridgel, MD  Electronically Signed    GWT/MedQ  DD: 11/18/2006  DT: 11/18/2006  Job #: [id number]   cc:   Barbette Hair. Artist Pais, DO  Learta Codding, MD,FACC

## 2010-09-27 NOTE — Telephone Encounter (Signed)
Call returned to patient [phone] answer. A detailed voice message was left informing patient medication was refilled by Dr Ladona Ridgel on yesterday. She was advised to contact their office to check status of medication refill

## 2010-09-29 NOTE — Cardiovascular Report (Signed)
. [facility]  Patient:    Destiny [name], Destiny [name] Visit Number: [id number] MRN: [id number]          Service Type: CAT Location: 6500 [id number] [id number] Attending Physician:  Learta Codding Dictated by:   Lewayne Bunting, M.D. [facility] Proc. Date: 06/03/01 Admit Date:  06/03/2001   CC:         Corwin Levins, M.D. [facility]  Lewayne Bunting, M.D. [facility]   Cardiac Catheterization  DATE OF BIRTH: 11-14-1940  REFERRING PHYSICIAN: Corwin Levins, M.D.  CARDIOLOGIST: Lewayne Bunting, M.D.  PROCEDURES PERFORMED: 1. Left and right heart catheterization. 2. Selective coronary angiography. 3. Ventriculography.  DIAGNOSES: 1. Severe two-vessel coronary artery disease with involvement of the proximal    left anterior descending. 2. Non-insulin dependent diabetes mellitus. 3. Left ventricular systolic dysfunction. Ejection fraction 25%. 4. Mild pulmonary hypertension.  HISTORY: The patient is a 70 year old, white female, with a history of multiple cardiac risks factors. The patient had a prior evaluation by Dr. Shelva Majestic in 1993 and had an abnormal Cardiolite. Recommendations were given for diagnostic catheterization, but the patient did not pursue this further. The patient is now referred due to increased shortness of breath over the last several weeks. In particularly, she had an episode of severe shortness of breath associated with heartburn just prior to the Christmas Holidays. She slowly improved but still reports marked dyspnea on exertion. She is now referred for left and right heart catheterization to assess her coronary anatomy and hemodynamics.  DESCRIPTION OF PROCEDURE: After informed consent was obtained, the patient was brought to the catheterization laboratory. The right groin was sterilely prepped and draped. Lidocaine 1% was infiltrated to provide local anesthesia. A 6 French arterial sheath was placed using the modified Seldinger technique. An 8 French venous sheath was also  placed using the modified Seldinger technique. Right heart catheterization was performed through the 8 French venous sheath with an S-tipped Swan-Ganz catheter. Appropriate right-sided hemodynamics were obtained. Cardiac output was then determined both by thermodilution and Fick principal. Attention was then turned to selective coronary angiography. A 6 [place] JL 3.5 catheter was used to engage the left main coronary artery. Selective coronary angiography was performed in various projections using manual injection of contrast. The right coronary ostium was accessed with a JR4 catheter. Following selective coronary angiography, a 6 French angled pigtail catheter was placed in the left ventricular cavity and appropriate left-sided hemodynamics were obtained. Ventriculography was then performed in a single plane RAO projection using power injection of contrast. At the termination of the procedure, all catheters were pulled back as well as the right heart catheter and right ventricular and right atrial pressures were obtained. Both sheaths were removed and adequate hemostasis was provided. No complications were noted.  FINDINGS: 1. Hemodynamics: Right atrial pressure 16 mmHg, right ventricular pressure    43/11 mmHg. Pulmonary artery pressure 43/25 mmHg with a mean of 31 mmHg.    Pulmonary capillary wedge pressure mean of 19 mmHg. Left ventricular    pressure 138/19 mmHg, aortic pressure 138/72 mmHg. Cardiac output 4.9    L/min., cardiac index 2.36 L/min. Fick cardiac output is 4.3 L/min.    Fick cardiac index is 2.1 L/min. 2. Ventriculography: Ejection fraction is estimated at 25%. There is global    hypokinesis with a area of inferior akinesis extending from the base to the    mid ventricle. No mitral regurgitation is noted.  SELECTIVE CORONARY ANGIOGRAPHY: 1. Left main coronary artery is a  large caliber vessel with no evidence of    flow-limiting coronary artery disease. 2. The left  anterior descending artery is a large caliber vessel wrapping    around the apex. There is a large area of focal stenosis originating    from the first diagonal branch past the first septal perforator. Stenosis    is estimated at approximately 70-80%. There is ostial stenosis of the    first diagonal branch which is a large bifurcating artery. The inferior    limb also has a 50% focal stenosis. There are three small septal    perforators. 3. Circumflex coronary artery is a large caliber vessel with no evidence    of flow-limiting disease. There is a very small first obtuse marginal    branch. The circumflex proper also is small. There is a large first obtuse    marginal branch bifurcating in two posterolateral branches.  There is no    flow-limiting coronary artery disease in this distribution. 4. The right coronary artery is a very large caliber vessel with a focal    90% stenosis just proximal to a moderate sized RV branch. The right    coronary artery terminates in a large posterior descending artery without    flow-limiting coronary artery disease.  RECOMMENDATIONS: The patient was given the option of percutaneous coronary intervention versus coronary artery bypass grafting. PCI certainly an option, particularly of the right coronary artery lesion but the LAD lesion would be much harder to treat as it is a long stenosis involving the very first bifurcating diagonal branch. Given the fact that the patient is a diabetic and has LV dysfunction, she most likely would be best served with surgical revascularization in the long run. Both options were presented to the patient and she will discuss this further with Dr. Laneta Simmers. The patients LV dysfunction may be partly secondary to ischemia heart disease but there is likely also component of obstructive sleep apnea which will need to be worked up further after surgery. Given her history of LV thrombus in the past, and LV  dysfunction,  consideration should be given to a long-term anticoagulation after surgical intervention. Dictated by:   Lewayne Bunting, M.D. [facility] Attending Physician:  Learta Codding DD:  06/03/01 TD:  06/03/01 Job: [id number]

## 2010-09-29 NOTE — Procedures (Signed)
[facility]  Patient:    Destiny [name], Destiny [name] Visit Number: [id number] MRN: [id number]          Service Type: MED Location: 2000 2018 01 Attending Physician:  Cleatrice Burke Dictated by:   Burna Forts, M.D. Proc. Date: 06/04/01 Admit Date:  06/03/2001                             Procedure Report  PROCEDURE:  Intraoperative transesophageal echocardiography.  INDICATION FOR PROCEDURE:  Destiny [name] is a 70 year old female who presents for coronary artery bypass grafting.  She is known to have a dilated cardiomyopathy with an exceedingly low EF around the 20% range estimated.  She is to undergo coronary artery bypass grafting by Dr. Laneta Simmers.  He has requested that we place the TEE probe for evaluation of cardiac function and structures throughout the operative procedure.  DESCRIPTION OF PROCEDURE:  The patient was brought to the holding area the morning of surgery, where under local anesthesia with sedation, radial arterial, IV, and pulmonary catheters are inserted.  She is then taken to the OR for routine induction of general anesthesia.  The trachea is intubated. The TEE probe is passed oropharyngeally into the stomach and withdrawn for imaging of cardiac structures.  PRECARDIOPULMONARY BYPASS TRANSESOPHAGEAL ECHOCARDIOGRAPHY EXAMINATION: Left ventricle:  The left ventricle is a globally dilated left ventricular chamber.  Estimated ejection fraction, however, appears to be greater than 20%, on the order of 25 to above percentage.  There is a seemingly dyskinetic septum noted.  There is an akinetic anteroseptal area.  There is a severely hypokinetic inferior wall noted.  There is contractility noted in the lateral and the anterolateral areas, but these appear to be the only segments that are actually thickening and contracting and moving inward in the short axis view. Papillary muscles are well-outlined.  Overall the left ventricular  wall chamber is of much increased size.  The wall thickness, however, is essentially normal.  It would be considered a globally hypokinetic left ventricular chamber.  Mitral valve:  This is a mildly thickened mitral valve apparatus; however, it opens appropriately and closes in the usual fashion.  It appears to have normal function.  Doppler examination reveals essentially no mitral regurgitant flow noted.  Left atrium:  There is a small left atrial chamber.  The interatrial septum is seen and interrogated and appears intact.  The appendage is visualized.  No masses are noted.  Overall, the left atrial chamber is of normal size.  Aortic valve:  There is moderate aortic sclerosis of the leaflets.  There are three leaflets, three cusps noted.  It opens satisfactorily on systole and closes appropriately on diastole.  Doppler examination in both short axis and long axis views again reveals essentially no stenotic areas or any aortic insufficiency.  Right ventricle:  This is an enlarged right ventricular chamber.  Heavy trabeculations are noted.  Pulmonary artery catheter noted within.  Tricuspid valve:  Normal, thin, compliant, mobile tricuspid valve with a trace tricuspid regurgitant flow.  Right atrium:  Normal right atrial chamber is noted.  The pulmonary artery catheter is seen within.  The patient was placed on cardiopulmonary bypass with hypothermia.  Coronary artery bypass grafting x 4 carried out by Dr. Evelene Croon.  The patient is rewarmed, inotropes are begun.  The patient is DDD-paced and separated from cardiopulmonary bypass with the initial attempt.  POSTCARDIOPULMONARY BYPASS TRANSESOPHAGEAL ECHOCARDIOGRAPHY EXAMINATION (limited  exam):  Left ventricle:  In the early bypass period, again we see a paced left ventricular chamber.  There does appear to be with the addition of the pacing and inotropes increased wall motion and activity in both lateral, anterolateral, and  perhaps anterior wall.  The inferior wall remains essentially severely hypokinetic.  The septal wall is essentially dyskinetic, and that anteroseptal area remains severely hypokinetic, but overall when compared to the prebypass period, the contractile pattern appears improved. There does appear to be a slightly increased ejection fraction at this time.  The rest of the cardiac examination was as previously described without any significant change.  The patient returned to the cardiac intensive care unit in stable condition. Dictated by:   Burna Forts, M.D. Attending Physician:  Cleatrice Burke DD:  06/04/01 TD:  06/05/01 Job: [id number]

## 2010-09-29 NOTE — Discharge Summary (Signed)
NAME:  SHAVERGeri, Hepler               ACCOUNT NO.:  [id number]   MEDICAL RECORD NO.:  [id number]          PATIENT TYPE:  INP   LOCATION:  3735                         FACILITY:  [facility]   PHYSICIAN:  Doylene Canning. Ladona Ridgel, M.D.  DATE OF BIRTH:  1941/01/10   DATE OF ADMISSION:  08/02/2005  DATE OF DISCHARGE:  08/03/2005                                 DISCHARGE SUMMARY   ALLERGIES:  SULFA, NIACIN.   PRINCIPAL DIAGNOSES:  1.  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  2.  Ischemic cardiomyopathy, ejection fraction by transesophageal      echocardiogram December 2006 with 20%.  3.  Class III congestive heart failure, low output.  4.  Left bundle branch block.  5.  Severe 3 vessel coronary artery disease status post coronary artery      bypass grafting surgery.  6.  Left ventricular thrombus Coumadin therapy.   SECONDARY DIAGNOSES:  1.  Type 2 diabetes.  2.  Hypertension.  3.  Dyslipidemia.  4.  Obstructive sleep apnea.  5.  Chronic back pain.  6.  Right carotid bruit.  7.  Status post cholecystectomy.  8.  Status post hysterectomy.  9.  Obesity.   PROCEDURES:  August 02, 2005 - Implantation of cardioverter defibrillator  with left ventricular lead augmenting, to provide cardiac re-synchronization  therapy.  The QRS has greatly improved from the 160's to 116 milliseconds.  The patient has had no post-procedural complications.   HISTORY OF PRESENT ILLNESS:  Ms. Storlie is a 70 year old female. She has a  history of diabetes, hypertension, ischemic cardiomyopathy. The patient has  left bundle branch block. Her cardiac symptoms are primarily due to low  cardiac output rather than volume overload. As far as congestive failure  symptoms, she does not get short of breath when she walks anywhere but she  becomes tired when doing any house work and has to sit down and rest. The  patient has not been recently hospitalized with pulmonary edema. Ejection  fraction variable in the past.  Most recently it is ejection fraction of 20%.  She is followed by Dr. Arrie Aran for renal disease. The patient's EKG shows  left bundle branch block with QRS of 170 milliseconds. Treatment options  include implantable cardioverter defibrillator with cardiac  resynchronization therapy. The risks and benefits have been described to the  patient. She wishes to proceed on an elective basis.   HOSPITAL COURSE:  The patient presented electively to [facility] on  August 02, 2005. She underwent implantation in electrophysiology lab of a St.  Jude Atlas by Dr. Lewayne Bunting. Left ventricular lead was successfully  placed. She had reduction in her QRS dimension from 170 to 116 milliseconds,  which shows excellent cardiac resynchronization. The patient has had no post  procedural complications. She has no hematoma at the incision site. She is  achieving 93% oxygen saturation on room air. She has no appreciable lower  extremity edema. Chest x-ray has been inspected and shows no evidence of  pneumothorax in all 3 leads, the right atrium, right ventricle, and left  ventricular  lead, all in appropriate position. The device has been  interrogated by the [facility]. Jude check representative and all values are within  normal limits. Mobility of the left arm has been discussed with the patient  prior to discharge. She has also completed 3 doses of antibiotics. Her  creatinine on admission was 1.7. She discharges with the following  medications.   DISCHARGE MEDICATIONS:  1.  Fluoxetine 20 mg daily.  2.  Warfarin 5 mg daily except 7.5 on Friday.  3.  Digoxin 0.125 mg daily.  4.  Enteric coated aspirin 81 mg daily.  5.  Multivitamin daily.  6.  Vytorin 10/40 1 tab daily at bedtime.  7.  Lasix 80 mg daily.  8.  Norvasc 5 mg daily.  9.  Lantus 60 units at bedtime, subcutaneous injection.  10. Enalapril 10 mg daily.  11. Genuvia 50 mg daily.  12. Coreg 25 mg tablets, 2 tabs in the morning and 2 tabs in  the evening.  13. Neurontin 300 mg daily.  14. Humalog 40 mg SQ in the morning. At lunch, Humalog per sliding scale.  15. Fenofibrate 200 mg daily.  16. Calcium with vitamin D 600 mg daily.   PLAN:  1.  The patient is to purse low-sodium, low-cholesterol, diabetic diet.  2.  She is asked not to drive for the next week.  3.  She is asked not to lift anything heavier than 10 pounds for the next 4      weeks.  4.  She is to keep her incision dry for the next 7 days and sponge bathe      until Thursday, August 09, 2005.   FOLLOW UP:  1.  She will have followup at the [facility], [address]  2.  ICD Clinic on Wednesday, August 15, 2005 at 10:00.  3.  Coumadin Clinic on Monday, August 06, 2005 at 2:45 p.m.  4.  She will see Dr. Ladona Ridgel on Tuesday, November 27, 2005 at noon.   Of note, the patient is possibly to be enrolled in the Response study.   LABORATORY DATA:  Preoperative labs are as follows:  Complete blood count:  White cells 4.4, hemoglobin 11.4, hematocrit 33. Platelets 217,000. Serum  electrolytes:  Sodium 134, potassium 4, chloride 97, carbonate 27, BUN 32,  creatinine 1.7, glucose 303. PT on admission was 18.2, INR 1.5. The PTT was  29.      Maple Mirza, P.A.    ______________________________  Doylene Canning. Ladona Ridgel, M.D.    GM/MEDQ  D:  08/03/2005  T:  08/05/2005  Job:  [id number]   cc:   Gregary Signs A. Everardo All, M.D. [facility]  [address]  [place]  [place] [address]   Learta Codding, M.D. [facility]  [address]  [address]  [place]  [place] [address]   Terrial Rhodes, M.D.  Fax: [phone]

## 2010-09-29 NOTE — Op Note (Signed)
[place]. [facility]  Patient:    Destiny [name], Destiny [name] Visit Number: [id number] MRN: [id number]          Service Type: MED Location: 2300 2314 01 Attending Physician:  Cleatrice Burke Dictated by:   Alleen Borne, M.D. Proc. Date: 06/04/01 Admit Date:  06/03/2001   CC:         Lewayne Bunting, M.D.  Cath lab   Operative Report  PREOPERATIVE DIAGNOSIS:  Severe two-vessel coronary artery disease with severe left ventricular dysfunction and congestive heart failure.  POSTOPERATIVE DIAGNOSIS:  Severe two-vessel coronary artery disease with severe left ventricular dysfunction and congestive heart failure.  OPERATION PERFORMED:  Median sternotomy, extracorporeal circulation, coronary artery bypass graft surgery x 4 using a left internal mammary artery graft to the left anterior descending coronary artery, with saphenous vein grafts to the first and second diagonal branch of the LAD, and the right coronary artery.  SURGEON:  Alleen Borne, M.D.  ASSISTANTTollie Pizza. Thomasena Edis, P.A.  ANESTHESIA:  General endotracheal.  INDICATIONS FOR PROCEDURE:  The patient is a 70 year old obese diabetic woman with a history of hypertension and hyperlipidemia who has been followed by cardiology for left ventricular dysfunction and congestive heart failure.  She has had nuclear scans in the past that have shown left ventricular dysfunction but no definite ischemia and it was thought that the cause of her cardiomyopathy was likely nonischemic.  She now presents with a several week history of increasing shortness of breath with minimal exertion, orthopnea, PND, and peripheral edema.  She had undergone an echocardiogram within the past few weeks.  This showed severe depression of left ventricular ejection fraction to about 25% with akinesis of the inferior, septal, and apical walls. They could not exclude a left ventricular apical thrombus.  The left ventricle was severely  dilated. There was mild mitral regurgitation.  The patient was admitted and underwent cardiac catheterization which showed severe two-vessel coronary artery disease.  The LAD had a long proximal 80% stenosis.  There was a large first diagonal that bifurcated immediately into two separate diagonal branches which I denoted diagonal 1 and diagonal 2.  Both of these had 50% proximal stenosis.  The right coronary artery was a large vessel and had 90% proximal to midvessel stenosis.  The left circumflex had no significant disease.  Left ventricular ejection fraction at catheterization was about 25% with no mitral regurgitation.  Pulmonary artery pressure was 43/25 with a wedge of 19.  Right ventricular pressure was 43/11 and the right atrial pressure was 16.  There was no gradient across the aortic valve and cardiac index was 2.36.  After review of the angiograms and examination of the patient it was felt that coronary artery bypass surgery was the best treatment to prevent further ischemia and infarction and further deterioration of left ventricular function.  I discussed the operative procedure with the patient and her family including alternatives to surgery, benefits, and risks including bleeding, possible blood transfusion, infection, stroke, myocardial infarction, and death.   understood and agreed to proceed with surgery.  DESCRIPTION OF PROCEDURE:  The patient was taken to the operating room and placed on the table in supine position.  After induction of general endotracheal anesthesia, a Foley catheter was placed in the bladder using sterile technique.  Then the chest, abdomen and both lower extremities were prepped and draped in the usual sterile manner.  A transesophageal echocardiogram was performed.  This showed severe left ventricular dysfunction with  an ejection fraction of about 25%.  There was no mitral regurgitation. No left ventricular thrombus was seen.  Then the chest was  entered through a median sternotomy incision and the pericardium opened in the midline.  Examination of the heart showed a large heart with global hypokinesis.  The ascending aorta was of normal size and had no palpable plaques in it.  Exposure was somewhat difficult due to the large size of the heart and her deep chest with at least 6 cm of subcutaneous fat above the sternum.  Then the left internal mammary artery was harvested from the chest wall as a pedicle graft.  This was a medium caliber vessel with excellent blood flow through it.  At the same time a segment of greater saphenous vein was harvested from the right leg.  This vein was of medium size and good quality.  Then the patient was heparinized and when an adequate activated clotting time was achieved, the distal ascending aorta was cannulated using a 20 French aortic cannula for arterial inflow.  Venous outflow was achieved using a two-stage venous cannula through the right atrial appendage.  An antegrade cardioplegia and vent cannula was inserted into the aortic root.  The patient was placed on cardiopulmonary bypass and the distal coronary arteries were identified.  There was a large amount of epicardial fat.  The LAD was a large graftable vessel.  The two diagonal branches were lying beneath the epicardial fat.  Both were large graftable vessels.  The right coronary artery was also a large graftable vessel with no distal disease in it.  Then the aorta was cross-clamped and 500 cc of cold blood antegrade cardioplegia was administered in the aortic root with quick arrest of the heart.  Systemic hypothermia to 20 degrees centigrade and topical hypothermia with iced saline was used.  A temperature probe was placed in the septum and an insulating pad in the pericardium.  The first distal anastomosis was performed to the distal right coronary  artery.  The internal diameter was greater than 3 mm.  The conduit used was  a segment of greater saphenous vein.  Anastomosis was performed in end-to-side manner using continuous 7-0 Prolene suture.  The flow was measured through the graft and was excellent.  The second distal anastomosis was performed to the first diagonal branch. The internal diameter was 1.75 mm.  The conduit used was a second segment of greater saphenous vein.  The anastomosis was performed in an end-to-side manner using continuous 7-0 Prolene suture.  The flow was measured through the graft and was excellent.  The third distal anastomosis was performed to the second diagonal branch.  The internal diameter was 1.75 mm.  The conduit used was a third segment of greater saphenous vein.  The anastomosis was performed in an end-to-side manner using continuous 7-0 Prolene suture.  The flow was measured through the graft and was excellent.  Then another dose of cardioplegia was down the vein graft and in the aortic root.  The fourth distal anastomosis was performed to the midportion of the left anterior descending coronary artery.  The internal diameter was about 2 mm. The conduit used was the left internal mammary artery graft and this was brought through an opening in the left pericardium and anterior to the phrenic nerve.  It was anastomosed to the LAD in end-to-side manner using continuous 8-0 Prolene suture.  The pedicle was tacked to the epicardium with 6-0 Prolene sutures.  The patient was rewarmed to  37 degrees and the clamp removed from the mammary pedicle.  There was rapid warming of the ventricular septum and return of spontaneous ventricular fibrillation.  The crossclamp was removed with a time of 51 minutes and the patient defibrillated into sinus rhythm.  A partial occlusion clamp was placed on the aortic root and the three proximal vein graft anastomoses were performed in end-to-side manner using continuous 6-0 Prolene suture.  The clamps were removed, the vein grafts deaired and  the clamps removed from them.  The proximal and distal anastomoses appeared hemostatic and the line of the grafts satisfactory.  Graft markers were placed around the proximal anastomoses.  Two temporary right ventricular and right atrial pacing wires were placed and brought out through the skin. When the patient had rewarmed to 37 degrees centigrade, she was weaned from cardiopulmonary bypass on low dose dopamine.  Total bypass time was 98 minutes.  Cardiac function appeared excellent with a cardiac output of 6L per minute.  Protamine was given and the venous and aortic cannulas were removed without difficulty.  Hemostasis was achieved.  Transesophageal echocardiogram was again performed and showed improved left ventricular function.  Three chest tubes were placed with a tube in the posterior pericardium, one in the left pleural space and one in the anterior mediastinum.  The pericardium was reapproximated over the heart.  The sternum was closed with #6 stainless steel wires.  The fascia was closed with continuous #1 Vicryl suture.  The subcutaneous tissues were closed using continuous 2-0 Vicryl and the skin with 3-0 Vicryl subcuticular closure.  The lower extremity vein harvest site was closed in layers in a similar manner with staples used for the skin.  The sponge, needle and instrument counts were correct according to the scrub nurse.  A dry sterile dressing was applied over the incisions and around the chest tubes which were hooked to Pleur-evac suction.  The patient remained hemodynamically stable and was transported to the SICU in guarded but stable condition. Dictated by:   Alleen Borne, M.D. Attending Physician:  Cleatrice Burke DD:  06/04/01 TD:  06/04/01 Job: [id number]

## 2010-09-29 NOTE — Assessment & Plan Note (Signed)
[facility]                            [facility] NOTE   NAME:Destiny [name], Destiny [name]                      MRN:          [id number]  DATE:01/08/2006                            DOB:          [birth year]    HISTORY OF PRESENT ILLNESS:  The patient is a middle aged female with a  history of ischemic cardiomyopathy and congestive heart failure.  The  patient is status post Bi-V ICD insertion in March 2007.  The patient has  had a dramatic improvement in her functional class.  She is currently NYHA  class 122.  She states she has no chest pain, shortness of breath,  orthopnea, PND.  She received a St. Jude device and is part of the [employer].  Her INR was checked today in the office and was 2.5.   MEDICATIONS:  1. Enalapril 10 mg p.o. daily.  2. Coreg two tablets 35 mg p.o. b.i.d.  3. Prozac 20 mg daily.  4. Lasix 80 mg p.o. daily.  5. Digitek 0.25 mg daily.  6. Lantus  7. Januvia  8. Neurontin.  9. Vytorin 10/40  10.__________200 mg daily.  11.Norvasc 5 mg daily.  12.Coumadin as directed.   PHYSICAL EXAMINATION:  VITAL SIGNS:  Blood pressure 120/60, heart rate 70  beats per minute.  NECK:  No carotid upstroke.  No carotid bruits.  LUNGS:  Clear.  Breath sounds bilaterally.  HEART:  Regular rate and rhythm at normal sinus, no murmurs, rubs or  gallops.  ABDOMEN:  Soft, nontender, no rebound or guarding, good bowel sounds.  EXTREMITIES:  No cyanosis, clubbing or edema.   PROBLEMS:  1. Ischemic cardiomyopathy.  2. History of congestive heart failure.  3. Left bundle branch block.  4. Status post CRT/ICD.   PLAN:  1. The patient is doing remarkably well.  Her function class has      dramatically improved.  2. I made no change in her medical regimen.  3. I will leave to you further management of the patient's dyslipidemia.  4. See if the patient can follow up with [place] in six months. January 07, 2006            Learta Codding, MD, [facility]   GED/MedQ  DD:  01/07/2006  DT:  01/08/2006  Job #:  [id number]   cc:   Doylene Canning. Ladona Ridgel, MD
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
## 2010-09-29 NOTE — Assessment & Plan Note (Signed)
[facility]                           ELECTROPHYSIOLOGY OFFICE NOTE   NAME:Destiny [name], Destiny [name]                      MRN:          [id number]  DATE:11/21/2005                            DOB:          March 09, 1941    REFERRING PHYSICIAN:  Marinus Maw   Ms. Edelen returns today for follow-up.  She is a very pleasant, obese,  middle-aged woman with a history of an ischemic cardiomyopathy, congestive  heart failure and left bundle branch block who underwent BiV ICD insertion  back in March.  The patient states that she felt better the day after the  procedure and continues to do well.  Prior to the procedure, she could not  do housework, could not go shopping, could not prepare foods and now she is  doing all these without difficulty.  She denies chest pain.   PHYSICAL EXAMINATION:  GENERAL APPEARANCE:  She is a pleasant, obese, middle-  aged woman in no acute distress.  VITAL SIGNS:  Blood pressure today was 100/50, pulse 74 and regular,  respirations 18, weight 255 pounds, up 3 pounds from her initial visit back  in March.  NECK:  No jugular venous distension.  LUNGS:  Clear to auscultation bilaterally.  CARDIOVASCULAR:  Regular rate and rhythm with normal S1 and S2.  EXTREMITIES:  No edema.   The EKG demonstrates sinus rhythm with P synchronous ventricular pacing, QR  restoration of 130 msec.   Check of her defibrillator demonstrates a St. Jude Atlas V-343 device  implanted in March of 2007.  The P and R waves were 3 and 12 respectively.  The pacing impedence 485 in the atrium, 500 in the ventricle and 730 in the  left ventricle.  Threshold 0.75 at 0.5 in the atrium, 1 at 0.5 in the right  ventricle and 1.25 at 0.5 in the left ventricle.  Battery voltage was 3.2  volts.  Today her outputs are decreased at 20.5 in the atrium, 2.5 at 0.5 in  the ventricle and 2.25 at 0.5 in the left ventricle.   IMPRESSION:  1.  Ischemic cardiomyopathy.  2.   Congestive heart failure.  3.  Left bundle branch block.  4.  Status post BiV implantable cardioverter defibrillator insertion.   DISCUSSION:  Overall Ms. Guttman has a very nice response to her BiV ICD  implantation with marked improvement in her heart failure going from class  III to class I to II.  I will plan to see her  back in follow-up in a year.  She will follow up with [facility].  She will see Dr. Andee Lineman next month.                                   Doylene Canning. Ladona Ridgel, MD   GWT/MedQ  DD:  11/21/2005  DT:  11/21/2005  Job #:  [id number]   cc:   Gregary Signs A. Everardo All, MD

## 2010-09-29 NOTE — Assessment & Plan Note (Signed)
[name]                          [facility] NOTE   NAME:[name], [name]                      MRN:          [id number]  DATE:07/29/2006                            DOB:          04/28/1941    HISTORY OF PRESENT ILLNESS:  The patient is a white female with a  history of ischemic cardiomyopathy and congestive heart failure.  Ejection fraction is 20%. The patient is status post Bi-V ICD device  with marked improvement in her functional class, as well as resolution  of her lower extremity edema. The patient states that she is doing  remarkably well. She has also lost 30 pounds. She still has difficulty  managing her diabetes and is managed by Dr. Artist Pais. She denies any chest  pain, shortness of breath, orthopnea, or PND. She is limited by  arthritic complaints and chronic back pain.   MEDICATIONS:  Enalapril 10 mg daily, Coreg 2 tablets 25 mg p.o. b.i.d.,  Prozac 20 mg p.o. daily, Lasix 80 mg p.o. daily, Digitek 0.25 daily,  Apidra  30 units t.i.d., Lantus insulin, Januvia, Neurontin, Vytorin  10/40, __________ once a day, Norvasc 5 mg daily, Coumadin as directed,  Biotin, aspirin 81 mg daily.   PHYSICAL EXAMINATION:  VITAL SIGNS:  Blood pressure 130/70, heart rate  65 beats per minute, weight 234 pounds.  GENERAL:  An overweight white female but with significant weight loss.  HEENT:  __________.  NECK:  Supple. Normal carotid upstroke. No carotid bruits.  LUNGS:  Clear breath sounds bilaterally.  HEART:  Regular rate and rhythm. Normal S1 and S2. No murmur, rub, or  gallop.  ABDOMEN:  Soft, nontender. No rebound or guarding. Good bowel sounds.  EXTREMITIES:  No clubbing, cyanosis, or edema.   PROBLEM LIST:  1. Ischemic cardiomyopathy.  2. History of congestive heart failure.  3. __________ status post CRT ICD.  4. Ejection fraction of 20%.  5. Three vessel coronary artery bypass grafting status post CABG.  6. Left ventricular  thrombus, on Coumadin therapy.   PLAN:  1. The patient is doing remarkably well. I will make to changes in her      medical regimen. The patient wants to discontinue Vytorin. I gave      her the recent negative advertisements. She will be switched to      Simvastatin 40 mg p.o. daily by Dr. Artist Pais.  2. The patient will have an echocardiographic study done in the next      couple of weeks in our      [facility] office to reassess her LV function.  3. The patient will be seen back in the office in 6 months.     Learta Codding, MD,FACC  Electronically Signed    GED/MedQ  DD: 07/29/2006  DT: 07/29/2006  Job #: [id number]

## 2010-09-29 NOTE — Discharge Summary (Signed)
[place]. [facility]  Patient:    Destiny [name], Destiny [name] Visit Number: [id number] MRN: [id number]          Service Type: MED Location: 2000 2018 01 Attending Physician:  Cleatrice Burke Dictated by:   Maxwell Marion, RNFA Admit Date:  06/03/2001 Disc. Date: 06/09/01   CC:         Evelene Croon, M.D.  Corwin Levins, M.D. [facility] Darrelyn Hillock, M.D. [facility]  Charlaine Dalton. Sherene Sires, M.D. [facility]  Lewayne Bunting, M.D. [facility]   Discharge Summary  DATE OF BIRTH:  Oct 20, 1940.  DATE OF SURGERY:  06/04/2001.  ADMISSION DIAGNOSIS:  Congestive heart failure.  PAST MEDICAL HISTORY: 1. Dilated cardiomyopathy, status post abnormal Cardiolite scan in 1993,    2D echocardiogram January 2003. 2. Diabetes mellitus type 2. 3. Hypertension. 4. Hyperlipidemia. 5. Possible obstructive sleep apnea. 6. Morbid obesity. 7. Past surgeries include total abdominal hysterectomy/ bilateral    salpingo-oophorectomy, cholecystectomy, right wrist ganglionectomy.  ALLERGIES:  SULFA.  DISCHARGE DIAGNOSIS:  Severe two vessel coronary artery disease with symptoms of congestive heart failure, status post coronary artery bypass grafting.  BRIEF HISTORY:  The patient is a 70 year old Caucasian female who presented with a several week history of increasing shortness of breath with minimal exertion, orthopnea, paroxysmal nocturnal dyspnea, peripheral edema.  A 2D echocardiogram performed on May 20, 2001 revealed severely depressed left ventricular ejection fraction at about 25%, and left apical thrombus could not be excluded.  The left ventricle was noted to be dilated and she had some mild mitral valve regurgitation.  It was recommended that she proceed at this time with a cardiac catheterization and the patient agreed.  HOSPITAL COURSE:  On 06/03/01 the patient was admitted to [facility] under the care of [facility].  Dr. Lewayne Bunting performed a cardiac  catheterization which revealed severe two vessel coronary artery disease, ejection fraction approximately 25% and no mitral valve regurgitation.  A cardiac surgery consultation was obtained with Dr. Evelene Croon.  After examination of the patient and review of the records including the catheterization films, he recommended coronary artery bypass grafting as the preferred treatment choice for this woman.  On 06/03/01 she also underwent Doppler studies which revealed no significant carotid artery disease. Her ABIs were noted to be greater than 1.0 bilaterally.  On June 04, 2001 the patient underwent an uncomplicated coronary artery bypass grafting times four with Dr. Evelene Croon. Grafts placed at the time of procedure were left internal mammary artery to the left anterior descending artery, saphenous vein grafted to the first diagonal artery, saphenous vein graft was grafted to the second diagonal artery, saphenous vein grafted to the right coronary artery.  Vein was harvested from the right lower extremity for the bypass grafts.  The patient tolerated the procedure well and was transferred in stable condition to the SICU.  The patient has remained hemodynamically stable and her postoperative course has been uneventful.  Of note her hemoglobin A1C measured on admission was 11.7.  Her diabetes mellitus has been managed in the postoperative period with resumption of her oral medications in addition to sliding scale insulin as needed.  Her CBGs measured from the p.m. of 06/07/01 to the a.m. on 06/08/01 were as follows: 223, 134, 158, 170, 202.  She will need close outpatient follow up for continued diabetes management.  The patient is making good progress in recovering from her surgery.  It is anticipated she will be ready for  discharge home tomorrow, 06/09/2001.  CONDITION ON DISCHARGE:  Improved.  MEDICATIONS AT DISCHARGE INCLUDE: 1. Enteric coated aspirin 325 mg p.o. q.d. 2. Tylox one  to two p.o. q.4-6h p.r.n. for pain. 3. Toprol XL 25 mg p.o. q.d. 4. Altace 2.5 mg p.o. q.d. 5. Potassium chloride 20 mEq p.o. q.d. 6. Ferrous gluconate 300 mg p.o. b.i.d.  Patient has also been instructed to resume the following home medications: 1. Starlix 120 mg p.o. t.i.d. (new dose). 2. Actos 45 mg p.o. q.d. 3. Lasix 40 mg p.o. q.d. (this is also a new dose). 4. Premarin 0.625 mg p.o. q.d. 5. Prozac 20 mg p.o. q.d. 6. Neurontin 300 mg p.o. q.d. 7. Lipitor 40 mg p.o. q.d. 8. Digoxin 0.25 mg p.o. q.d.  FOLLOW UP: 1. It has been recommended to the patient to follow up with Dr. Everardo All within    the next two weeks for her diabetes mellitus. 2. She will have an appointment to see Dr. Andee Lineman in his office in    approximately two weeks and have a chest x-ray taken at that time. 3. She will also have an appointment to see Dr. Laneta Simmers at the [facility] office    in approximately 3 weeks. The office will call the patient with the date    and time for that appointment. Dictated by:   Maxwell Marion, RNFA Attending Physician:  Cleatrice Burke DD:  06/08/01 TD:  06/09/01 Job: [id number]

## 2010-09-29 NOTE — Op Note (Signed)
NAME:  SHAVERBaylin, Destiny [name]               ACCOUNT NO.:  [id number]   MEDICAL RECORD NO.:  [id number]          PATIENT TYPE:  INP   LOCATION:  3735                         FACILITY:  [facility]   PHYSICIAN:  Doylene Canning. Ladona Ridgel, M.D.  DATE OF BIRTH:  27-Feb-1941   DATE OF PROCEDURE:  08/02/2005  DATE OF DISCHARGE:  08/03/2005                                 OPERATIVE REPORT   PROCEDURE PERFORMED:  Implantation of a biventricular ICD.   I:  INTRODUCTION:  The patient is a very pleasant middle aged woman with a  history of obesity, hypertension, diabetes, and ischemic cardiomyopathy and  left bundle branch block with chronic renal insufficiency.  She is referred  by Dr. Lewayne Bunting because of congestive heart failure symptoms due to low  output.  She is now referred for BiV ICD insertion secondary to her EF of  20%, class III heart failure, and left bundle branch block despite maximal  medical therapy.   II:  PROCEDURE:  After informed consent was obtained, the patient was taken  to the diagnostic EP lab in a fasting state.  After the usual preparation  and draping, intravenous fentanyl and midazolam was given for sedation.  30  mL of lidocaine was infiltrated into the left infraclavicular region.  A 9  cm incision was carried out over this region and electrocautery utilized to  dissect down to the fascial plane.  10 mL of contrast was injected into the  left upper extremity venous system demonstrating a patent left subclavian  vein.  It was subsequently punctured x 3 and the St. Jude model 7001, 65 cm  active fixation defibrillation lead, serial number [id number], was advanced  into the right ventricle, St. Jude model 1788T, 52 cm active fixation pacing  lead, serial number [id number], was advanced to the right atrium.  Mapping was  carried out in the right ventricle and at the final site in the RV apex, the  R waves measured 25 millivolts and the pacing impedance with the lead  actively fixed was 580  ohms.  10 volt pacing at this location did not  stimulate the diaphragm and the pacing threshold was 0.7 volts at 0.5  millisecond.  With the right ventricular lead in satisfactory position,  attention was turned to placement of the right atrial lead which was placed  in the right atrial appendage where P waves measured between 3 millivolts  and the pacing threshold was 0.5 volts at 0.5 milliseconds.  Again, 10 volt  pacing was carried which demonstrated no diaphragmatic stimulation.  The  pacing impedance was 440 ohms.  With both atrial and right ventricular leads  in satisfactory position, attention was turned to placement of the left  ventricular lead.  Coronary sinus cannulation was very difficult because the  coronary sinus was markedly posterior and the right atrium was somewhat  smaller than would be expected.  This resulted in very minimal room to  manipulate the catheter and difficulty achieving CS access.  Unfortunately,  there was a very high take off of the coronary sinus which also made  advancement of the  coronary sinus catheter more difficult.  Ultimately,  after approximately an 1 1/2 hours, the coronary sinus was successfully  cannulated and venography was carried out demonstrating a posterolateral  vein and a high lateral vein, both of which were acceptable for LV pacing.  Initially, the lead was placed in the high lateral vein, however, it could  not be advanced as far as it was thought to be necessary and was not  particularly lateral in the LAO projection.  For this reason, the guiding  catheter was withdrawn and the left ventricular lead was backed up into the  posterior portion of the coronary sinus.  The posterolateral vein was  cannulated with the angioplasty guide-wire and the pacing lead was advanced  over the guide-wire into the posterolateral wall of the left ventricle.  At  this location, the pacing threshold was a 1 volt at 0.5 milliseconds and the  pacing  impedance was 900 ohms.  10 volt pacing did not stimulate the  diaphragm.  With these satisfactory parameters, the leads were secured to  the subpectoralis fascia with a figure-of-eight silk suture.  The sewing  sleeve was also secured with a silk suture.  Electrocautery was utilized to  make a subcutaneous pocket.  Kanamycin irrigation was utilized to irrigate  the pocket and the St. Jude Atlas Plus HF, model V-343, biventricular ICD  was connected to the right atrial, right ventricular, and left ventricular  lead and placed in the subcutaneous pocket.  The generator was secured with  a silk suture.  The patient was more deeply sedated with fentanyl, Valium,  and Valium, and defibrillator threshold testing was carried out.  After the  patient was more deeply sedated with fentanyl and Versed, VF was induced  with a T-wave shock and a 15 joule shock was delivered which terminated  ventricular fibrillation and restored sinus rhythm.  Five minutes was  allowed to elapse and a second VFT carried out.  Again, VF was induced with  a T-wave shock and, again, a 15 joule shock was delivered terminating  ventricular fibrillation and restoring sinus rhythm.  At this point, no  additional defibrillation threshold testing was carried out and the incision  was closed with a layer of 2-0 Vicryl followed by a layer of 3-0 Vicryl  followed by a layer of 4-0 Vicryl.  Benzoin was painted on the skin, Steri-  Strips were applied, and a pressure dressing placed.  The patient was  returned to her room in satisfactory condition.   III:  COMPLICATIONS:  There were no immediate procedure complications.   IV:  RESULTS:  Successful implantation of a biventricular ICD in a patient  with cardiomyopathy, EF 20%, left bundle branch block, and class III  congestive heart failure.           ______________________________  Doylene Canning. Ladona Ridgel, M.D.    GWT/MEDQ  D:  08/02/2005  T:  08/03/2005  Job:  [id number]   cc:   Learta Codding, M.D. [facility]  [address]  [address]  [place]  [place] [address]   Cleophas Dunker. Everardo All, M.D. [facility]  [address]  [place]  [place] [address]

## 2010-10-16 ENCOUNTER — Encounter: Payer: Medicare Other | Admitting: *Deleted

## 2010-10-22 ENCOUNTER — Emergency Department (HOSPITAL_COMMUNITY): Payer: Medicare Other

## 2010-10-22 ENCOUNTER — Emergency Department (HOSPITAL_COMMUNITY)
Admission: EM | Admit: 2010-10-22 | Discharge: 2010-10-23 | Disposition: A | Discharge status: Other Acute Inpatient Hospital | Payer: Medicare Other | Source: Home / Self Care | Attending: Emergency Medicine | Admitting: Emergency Medicine

## 2010-10-22 LAB — CBC
MCH: 29.1 pg (ref 26.0–34.0)
MCHC: 33.1 g/dL (ref 30.0–36.0)
MCV: 88 fL (ref 78.0–100.0)
Platelets: 184 10*3/uL (ref 150–400)
RDW: 14.3 % (ref 11.5–15.5)

## 2010-10-22 LAB — DIFFERENTIAL
Basophils Relative: 0 % (ref 0–1)
Eosinophils Absolute: 0.2 10*3/uL (ref 0.0–0.7)
Eosinophils Relative: 1 % (ref 0–5)
Lymphs Abs: 1.3 10*3/uL (ref 0.7–4.0)
Monocytes Relative: 9 % (ref 3–12)

## 2010-10-22 LAB — TROPONIN I: Troponin I: <0.3 ng/mL (ref <0.30)

## 2010-10-23 ENCOUNTER — Inpatient Hospital Stay (HOSPITAL_COMMUNITY)
Admission: EM | Admit: 2010-10-23 | Discharge: 2010-10-25 | DRG: 292 | Disposition: A | Discharge status: Home / Self Care | Payer: Medicare Other | Source: Ambulatory Visit | Attending: Cardiology | Admitting: Cardiology

## 2010-10-23 DIAGNOSIS — I5033 Acute on chronic diastolic (congestive) heart failure: Secondary | ICD-10-CM | POA: Diagnosis present

## 2010-10-23 DIAGNOSIS — Z951 Presence of aortocoronary bypass graft: Secondary | ICD-10-CM

## 2010-10-23 DIAGNOSIS — E1142 Type 2 diabetes mellitus with diabetic polyneuropathy: Secondary | ICD-10-CM | POA: Diagnosis present

## 2010-10-23 DIAGNOSIS — I110 Hypertensive heart disease with heart failure: Principal | ICD-10-CM | POA: Diagnosis present

## 2010-10-23 DIAGNOSIS — Z9119 Patient's noncompliance with other medical treatment and regimen: Secondary | ICD-10-CM

## 2010-10-23 DIAGNOSIS — I5041 Acute combined systolic (congestive) and diastolic (congestive) heart failure: Secondary | ICD-10-CM

## 2010-10-23 DIAGNOSIS — E039 Hypothyroidism, unspecified: Secondary | ICD-10-CM | POA: Diagnosis present

## 2010-10-23 DIAGNOSIS — E785 Hyperlipidemia, unspecified: Secondary | ICD-10-CM | POA: Diagnosis present

## 2010-10-23 DIAGNOSIS — Z95810 Presence of automatic (implantable) cardiac defibrillator: Secondary | ICD-10-CM

## 2010-10-23 DIAGNOSIS — J984 Other disorders of lung: Secondary | ICD-10-CM | POA: Diagnosis present

## 2010-10-23 DIAGNOSIS — Z794 Long term (current) use of insulin: Secondary | ICD-10-CM

## 2010-10-23 DIAGNOSIS — E1149 Type 2 diabetes mellitus with other diabetic neurological complication: Secondary | ICD-10-CM | POA: Diagnosis present

## 2010-10-23 DIAGNOSIS — Z87440 Personal history of urinary (tract) infections: Secondary | ICD-10-CM

## 2010-10-23 DIAGNOSIS — G4733 Obstructive sleep apnea (adult) (pediatric): Secondary | ICD-10-CM | POA: Diagnosis present

## 2010-10-23 DIAGNOSIS — Z86718 Personal history of other venous thrombosis and embolism: Secondary | ICD-10-CM

## 2010-10-23 DIAGNOSIS — E669 Obesity, unspecified: Secondary | ICD-10-CM | POA: Diagnosis present

## 2010-10-23 DIAGNOSIS — Z7982 Long term (current) use of aspirin: Secondary | ICD-10-CM

## 2010-10-23 DIAGNOSIS — G894 Chronic pain syndrome: Secondary | ICD-10-CM | POA: Diagnosis present

## 2010-10-23 DIAGNOSIS — E11319 Type 2 diabetes mellitus with unspecified diabetic retinopathy without macular edema: Secondary | ICD-10-CM | POA: Diagnosis present

## 2010-10-23 DIAGNOSIS — Z91199 Patient's noncompliance with other medical treatment and regimen due to unspecified reason: Secondary | ICD-10-CM

## 2010-10-23 DIAGNOSIS — Z79899 Other long term (current) drug therapy: Secondary | ICD-10-CM

## 2010-10-23 DIAGNOSIS — I2589 Other forms of chronic ischemic heart disease: Secondary | ICD-10-CM | POA: Diagnosis present

## 2010-10-23 DIAGNOSIS — Z7901 Long term (current) use of anticoagulants: Secondary | ICD-10-CM

## 2010-10-23 DIAGNOSIS — I369 Nonrheumatic tricuspid valve disorder, unspecified: Secondary | ICD-10-CM

## 2010-10-23 DIAGNOSIS — I509 Heart failure, unspecified: Secondary | ICD-10-CM | POA: Diagnosis present

## 2010-10-23 DIAGNOSIS — I2510 Atherosclerotic heart disease of native coronary artery without angina pectoris: Secondary | ICD-10-CM | POA: Diagnosis present

## 2010-10-23 LAB — MRSA PCR SCREENING: MRSA by PCR: POSITIVE — AB

## 2010-10-23 LAB — PROTIME-INR
INR: 2.68 — ABNORMAL HIGH (ref 0.00–1.49)
Prothrombin Time: 28.6 seconds — ABNORMAL HIGH (ref 11.6–15.2)

## 2010-10-23 LAB — HEMOGLOBIN A1C: Hgb A1c MFr Bld: 7.1 % — ABNORMAL HIGH (ref <5.7)

## 2010-10-23 LAB — BASIC METABOLIC PANEL
CO2: 30 mEq/L (ref 19–32)
Chloride: 98 mEq/L (ref 96–112)
GFR calc non Af Amer: 48 mL/min — ABNORMAL LOW (ref >60)
Glucose, Bld: 155 mg/dL — ABNORMAL HIGH (ref 70–99)
Potassium: 4.7 mEq/L (ref 3.5–5.1)
Sodium: 136 mEq/L (ref 135–145)

## 2010-10-23 LAB — GLUCOSE, CAPILLARY: Glucose-Capillary: 162 mg/dL — ABNORMAL HIGH (ref 70–99)

## 2010-10-24 DIAGNOSIS — I5033 Acute on chronic diastolic (congestive) heart failure: Secondary | ICD-10-CM

## 2010-10-24 LAB — GLUCOSE, CAPILLARY: Glucose-Capillary: 138 mg/dL — ABNORMAL HIGH (ref 70–99)

## 2010-10-24 LAB — PROTIME-INR
INR: 2.04 — ABNORMAL HIGH (ref 0.00–1.49)
Prothrombin Time: 23.2 seconds — ABNORMAL HIGH (ref 11.6–15.2)

## 2010-10-24 LAB — CBC
Hemoglobin: 11.7 g/dL — ABNORMAL LOW (ref 12.0–15.0)
MCH: 29.8 pg (ref 26.0–34.0)
RBC: 3.93 MIL/uL (ref 3.87–5.11)

## 2010-10-24 LAB — BASIC METABOLIC PANEL
CO2: 30 mEq/L (ref 19–32)
Calcium: 8.7 mg/dL (ref 8.4–10.5)
GFR calc non Af Amer: 40 mL/min — ABNORMAL LOW (ref >60)
Sodium: 138 mEq/L (ref 135–145)

## 2010-10-24 LAB — PRO B NATRIURETIC PEPTIDE: Pro B Natriuretic peptide (BNP): 4179 pg/mL — ABNORMAL HIGH (ref 0–125)

## 2010-10-25 ENCOUNTER — Telehealth: Payer: Self-pay | Admitting: Internal Medicine

## 2010-10-25 LAB — BASIC METABOLIC PANEL
CO2: 30 mEq/L (ref 19–32)
Calcium: 8.9 mg/dL (ref 8.4–10.5)
Creatinine, Ser: 1.39 mg/dL — ABNORMAL HIGH (ref 0.4–1.2)
Glucose, Bld: 187 mg/dL — ABNORMAL HIGH (ref 70–99)

## 2010-10-25 LAB — GLUCOSE, CAPILLARY
Glucose-Capillary: 176 mg/dL — ABNORMAL HIGH (ref 70–99)
Glucose-Capillary: 102 mg/dL — ABNORMAL HIGH (ref 70–99)

## 2010-10-25 NOTE — Telephone Encounter (Signed)
Called nurse back on 4700 and they had figured out what ever it was they were questioning

## 2010-10-25 NOTE — Telephone Encounter (Signed)
Destiny [name] with [facility] has questions re pt-has accent and hard to understand

## 2010-10-26 NOTE — Discharge Summary (Signed)
Destiny [name], Destiny [name] NO.:  [id number]  MEDICAL RECORD NO.:  [id number]  LOCATION:  4704                         FACILITY:  [facility]  PHYSICIAN:  Verne Carrow, MDDATE OF BIRTH:  03-23-1941  DATE OF ADMISSION:  10/23/2010 DATE OF DISCHARGE:  10/25/2010                              DISCHARGE SUMMARY   PRIMARY CARDIOLOGIST:  Learta Codding, MD, [facility]  PRIMARY ELECTROPHYSIOLOGIST:  Doylene Canning. Ladona Ridgel, MD  DISCHARGE DIAGNOSES: 1. Acute respiratory distress/insufficiency secondary to congestive     heart failure. 2. Acute on chronic diastolic congestive heart failure. 3. Coronary artery disease.  REASON FOR ADMISSION:  Shortness of breath secondary to congestive heart failure.  HOSPITAL COURSE:  This is a 70 year old Caucasian female with a history of obesity, type 2 diabetes mellitus, coronary artery disease, hypertension, obstructive sleep apnea, diabetes mellitus, hypothyroidism, and hyperlipidemia who has a history of LV systolic dysfunction with complete recovery by recent assessment as well as diastolic dysfunction who presented to the hospital on October 23, 2010, with complaints of shortness of breath.  In the emergency department, the patient was found to have saturations of 80% and was placed on high- flow oxygen via nasal cannula.  She was admitted to the Step-Down ICU where she was placed on intravenous Lasix.  Her cardiac enzymes were negative.  She diuresed well on intravenous Lasix.  She was transferred to the floor on October 24, 2010.  Echocardiogram performed in the hospital showed a probable normal left ventricular systolic function with mild LVH, mild aortic stenosis, mitral annular calcification, and mild left atrial enlargement.  The patient had complete resolution of her shortness of breath here in the hospital.  She was discharged to home in stable condition.  PROCEDURES PERFORMED IN THE HOSPITAL:  Two-D echocardiogram performed on October 23, 2010, shows probable normal ejection fraction with mild LVH, mild AS, mild mitral annular calcification, and mild left atrial enlargement.  DISCHARGE MEDICATIONS:  Please see computer dictation.  The patient will be discharging on her home medications.  This will include: 1. Torsemide 30 mg twice daily. 2. Multivitamins once daily. 3. Levothyroxine 25 mcg once daily. 4. Gabapentin 100 mg as needed. 5. Enalapril 10 mg once daily. 6. Crestor 10 mg once daily. 7. Coreg 25 mg twice daily. 8. Vitamin D3 1000 units once daily. 9. Coumadin 7.5 mg once daily. 10.Fish oil 1200 mg 2 capsules once daily. 11.Fentanyl patches 25 mcg per hour transdermally 1 patch once every 3     days. 12.Regular Humulin insulin 30 units twice daily before meals. 13.Novolin insulin 30 units twice daily before meals. 14.Aspirin 81 mg once daily. 15.Amlodipine 5 mg once daily.  The patient will have followup planned with Tereso Newcomer, physician's assistant in the [address] [place] office in 7-10 days.  The patient wishes to transfer her care to the [place] office.  She would like to follow up with Dr. Lewayne Bunting for her cardiac needs as she already knows Dr. Ladona Ridgel well for pacemaker checks.  She has been very pleased with her care per Dr. Andee Lineman, but lives in [place] and would prefer to follow up here in this office in [facility].  TIME  SPENT ON DISCHARGE:  30 minutes.  ADDENDUM:  The patient's oxygen saturations on room air dropped to 82%. This is documented in the paper record.  She will require home oxygen therapy.  We will write an order for this as she is leaving the hospital.     Verne Carrow, MD     CM/MEDQ  D:  10/25/2010  T:  10/26/2010  Job:  [id number]  cc:   Learta Codding, MD,FACC Doylene Canning. Ladona Ridgel, MD  Electronically Signed by Verne Carrow MD on 10/26/2010 01:51:00 PM

## 2010-10-27 ENCOUNTER — Ambulatory Visit: Payer: Medicare Other | Admitting: Cardiology

## 2010-10-31 ENCOUNTER — Ambulatory Visit (INDEPENDENT_AMBULATORY_CARE_PROVIDER_SITE_OTHER): Payer: Medicare Other | Admitting: *Deleted

## 2010-10-31 DIAGNOSIS — I6350 Cerebral infarction due to unspecified occlusion or stenosis of unspecified cerebral artery: Secondary | ICD-10-CM

## 2010-10-31 DIAGNOSIS — I4891 Unspecified atrial fibrillation: Secondary | ICD-10-CM

## 2010-10-31 DIAGNOSIS — I238 Other current complications following acute myocardial infarction: Secondary | ICD-10-CM

## 2010-10-31 LAB — POCT INR: INR: 2.3

## 2010-11-07 ENCOUNTER — Ambulatory Visit (INDEPENDENT_AMBULATORY_CARE_PROVIDER_SITE_OTHER): Payer: Medicare Other | Admitting: Cardiology

## 2010-11-07 ENCOUNTER — Encounter: Payer: Self-pay | Admitting: Internal Medicine

## 2010-11-07 ENCOUNTER — Encounter: Payer: Self-pay | Admitting: Cardiology

## 2010-11-07 VITALS — Ht 65.0 in | Wt 250.0 lb

## 2010-11-07 DIAGNOSIS — I4891 Unspecified atrial fibrillation: Secondary | ICD-10-CM

## 2010-11-07 DIAGNOSIS — R0602 Shortness of breath: Secondary | ICD-10-CM

## 2010-11-07 DIAGNOSIS — I255 Ischemic cardiomyopathy: Secondary | ICD-10-CM

## 2010-11-07 DIAGNOSIS — R0902 Hypoxemia: Secondary | ICD-10-CM | POA: Insufficient documentation

## 2010-11-07 DIAGNOSIS — E785 Hyperlipidemia, unspecified: Secondary | ICD-10-CM

## 2010-11-07 DIAGNOSIS — I509 Heart failure, unspecified: Secondary | ICD-10-CM

## 2010-11-07 DIAGNOSIS — I2589 Other forms of chronic ischemic heart disease: Secondary | ICD-10-CM

## 2010-11-07 DIAGNOSIS — G4730 Sleep apnea, unspecified: Secondary | ICD-10-CM

## 2010-11-07 DIAGNOSIS — Z95810 Presence of automatic (implantable) cardiac defibrillator: Secondary | ICD-10-CM

## 2010-11-07 DIAGNOSIS — C8589 Other specified types of non-Hodgkin lymphoma, extranodal and solid organ sites: Secondary | ICD-10-CM

## 2010-11-07 DIAGNOSIS — I5031 Acute diastolic (congestive) heart failure: Secondary | ICD-10-CM | POA: Insufficient documentation

## 2010-11-07 DIAGNOSIS — C8590 Non-Hodgkin lymphoma, unspecified, unspecified site: Secondary | ICD-10-CM | POA: Insufficient documentation

## 2010-11-07 MED ORDER — LORAZEPAM 0.5 MG PO TABS: 0.5000 mg | ORAL_TABLET | Freq: Every day | ORAL | Status: DC

## 2010-11-07 NOTE — Progress Notes (Signed)
HPI The patient is a 70 year old female with a history of coronary artery disease status post coronary bypass grafting, CRT-D with recent revision secondary to end of life, diabetes mellitus, hypertension dyslipidemia. The patient also has a history of obstructive sleep apnea. Over the last year she has had variable ejection fractions. She has not required 2 hospitalizations in the last 6 months for recurrent heart failure symptoms. Her most recent admission was June 2012. She was diuresed and she was felt to have acute on chronic diastolic heart failure. An echocardiogram was obtained which showed a probable normal ejection fraction with mild aortic stenosis and mild left atrial enlargement. She was diuresed during his hospitalization and did quite well but despite this she remained hypoxemia during exertion but levels dropping to 83%. She is now using a scooter and is in the process of obtaining oxygen therapy. She reports no further edema and I suspect that the increased salt intake after her recent vacation. From a cardiac standpoint the patient is otherwise doing well. She reports no palpitations presyncope or syncope. EKG was reviewed today shows normal AV sequential pacing secondary to biventricular pacing. 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 The patient has poor echocardiographic windows for imaging and I have suggested during the last office visit a MUGA scan  but the patient has declined this.  Allergies  Allergen Reactions  . Sulfonamide Derivatives     Current Outpatient Prescriptions on File Prior to Visit  Medication Sig Dispense Refill  . amLODipine (NORVASC) 5 MG tablet Take 5 mg by mouth daily.        Marland Kitchen aspirin 81 MG tablet Take 81 mg by mouth daily.        . Calcium 600-200 MG-UNIT per tablet Take 1 tablet by mouth daily.        . carvedilol (COREG) 25 MG tablet TAKE TWO TABLETS BY MOUTH TWICE DAILY  360 tablet  1  .  Cholecalciferol (VITAMIN D) 1000 UNITS capsule Take 1,000 Units by mouth daily.        . citalopram (CELEXA) 20 MG tablet Take 20 mg by mouth daily.        Marland Kitchen docusate sodium (COLACE) 100 MG capsule Take 100 mg by mouth 2 (two) times daily as needed.       . enalapril (VASOTEC) 10 MG tablet Take 1 tablet (10 mg total) by mouth daily.  30 tablet  3  . fentaNYL (DURAGESIC - DOSED MCG/HR) 25 MCG/HR Place 1 patch onto the skin every 3 (three) days. Current 50 every 3 days tapering to 25 next month and then discontinued      . gabapentin (NEURONTIN) 100 MG capsule One caps twice daily      . insulin NPH (NOVOLIN N) 100 UNIT/ML injection Inject 30 Units into the skin 2 (two) times daily.  10 mL  3  . insulin regular (HUMULIN R) 100 UNIT/ML injection Inject 30 Units into the skin 2 (two) times daily before a meal.       . Insulin Syringe-Needle U-100 (B-D INS SYR ULTRAFINE 1CC/30G) 30G X 1/2" 1 ML MISC Use for insulin injection four times a day (250.02)       . levothyroxine (SYNTHROID, LEVOTHROID) 25 MCG tablet Take 1 tablet (25 mcg total) by mouth daily.  30 tablet  3  . Multiple Vitamins-Minerals (MULTIVITAMIN WITH MINERALS) tablet Take 1 tablet by mouth daily.        Marland Kitchen  Omega-3 Fatty Acids (FISH OIL) 1200 MG CAPS Take 2 capsules by mouth 2 (two) times daily.        . rosuvastatin (CRESTOR) 10 MG tablet Take 10 mg by mouth daily.        Marland Kitchen torsemide (DEMADEX) 20 MG tablet Take 1.5 tablets (30 mg total) by mouth 2 (two) times daily.  90 tablet  3  . warfarin (COUMADIN) 5 MG tablet Take as directed by Anticoagulation clinic   45 tablet  3  . DISCONTD: LORazepam (ATIVAN) 0.5 MG tablet Take 1 tablet (0.5 mg total) by mouth at bedtime.  30 tablet  3    Past Medical History  Diagnosis Date  . ICD (implantable cardiac defibrillator) in place   . LBBB (left bundle branch block)   . Carotid bruit   . Dyslipidemia   . Hypertension   . CHF (congestive heart failure)     Diastolic heart failure but with  history of variable ejection fractions most recent ejection fraction normal mild LVH June 2012 status post recent hospitalization in June for heart failure with volume overload  . Stroke   . Back pain, chronic   . Sleep apnea   . Cardiomyopathy, ischemic     Status post coronary bypass grafting no recurrent chest pain  . Cough   . History of recurrent UTIs   . Cystitis, acute   . Rhinosinusitis   . Dysuria   . Thyroid disease     hypothyroidism  . 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 CRI (chronic renal insufficiency)   . 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 DDD (degenerative disc disease)     multilevel  . Lymphoma, non Hodgkin's     In remission  . Biventricular implantable cardiac defibrillator in situ     Status post device revision secondary to end of life  . Hypoxemia requiring supplemental oxygen     Past Surgical History  Procedure Date  . Abdominal hysterectomy 1982  . Cholecystectomy 1970  . Coronary artery bypass graft 2003  . Tubal ligation     Family History  Problem Relation Age of Onset  . Cancer Mother     kidney and female repo  . Heart disease Father   . Asthma Mother     History   Social History  . Marital Status: Married    Spouse Name: N/A    Number of Children: Y  . Years of Education: N/A   Occupational History  . 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    Social History Main Topics  . Smoking status: Never Smoker   . Smokeless tobacco: Never Used  . Alcohol Use: No  . Drug Use: No  . Sexually Active: Not on file   Other Topics Concern  . Not on file   Social History Narrative   Regular Exercise:  No   KZS:WFUXNATFT positives as outlined above. The remainder of the 18  point review of systems is negative  PHYSICAL EXAM Ht 5\' 5"  (1.651 m)  Wt 250 lb (113.399 kg)  BMI 41.60 kg/m2  General: Overweight white female Head: Normocephalic and  atraumatic Eyes:PERRLA/EOMI intact, conjunctiva and lids normal Ears: No deformity or lesions Mouth:normal dentition, normal posterior pharynx Neck: Supple, no JVD.  No masses, thyromegaly or abnormal cervical nodes Lungs: Normal breath sounds bilaterally without wheezing.  Normal percussion Cardiac: regular rate and  rhythm with normal S1 and S2, no S3 or S4.  PMI is normal.  No pathological murmurs Abdomen: Normal bowel sounds, abdomen is soft and nontender without masses, organomegaly or hernias noted.  No hepatosplenomegaly MSK: Back normal, normal gait muscle strength and tone normal Vascular: Pulse is normal in all 4 extremities Extremities: No peripheral pitting edema Neurologic: Alert and oriented x 3 Skin: Intact without lesions or rashes Lymphatics: No significant adenopathy Psychologic: Normal affect  ECG: AV sequential pacing, biventricular pacing heart rate 60 beats per minute  ASSESSMENT AND PLAN

## 2010-11-07 NOTE — Assessment & Plan Note (Signed)
No recurrent chest pain. Status post coronary bypass grafting.

## 2010-11-07 NOTE — Patient Instructions (Signed)
Your physician wants you to follow-up in: 6 months. You will receive a reminder letter in the mail one-two months in advance. If you don't receive a letter, please call our office to schedule the follow-up appointment. Your physician recommends that you continue on your current medications as directed. Please refer to the Current Medication list given to you today. 

## 2010-11-07 NOTE — Assessment & Plan Note (Signed)
Status post recent device revision for end of life. No complications

## 2010-11-07 NOTE — Assessment & Plan Note (Signed)
2 hospitalizations for volume overload in the last year with preserved ejection fraction. Last episode occurred after the patient returned from a trip. Likely related to increased salt intake. Edema has now resolved. Ejection fraction has remained stable

## 2010-11-07 NOTE — Assessment & Plan Note (Signed)
No recurrent arrhythmias.

## 2010-11-07 NOTE — Assessment & Plan Note (Signed)
Patient is in the process of getting oxygen as her oxygen saturation during exertion dropped to 83% as documented during her recent hospitalization. This was after she was treated for heart failure.

## 2010-11-07 NOTE — Assessment & Plan Note (Signed)
Laboratory work follow by primary care physician

## 2010-11-07 NOTE — Assessment & Plan Note (Signed)
Status post non-Hodgkin lymphoma of the left thigh associated with unilateral edema. Followed by oncology status post treatment in complete remission.

## 2010-11-14 NOTE — H&P (Signed)
NAME:  Destiny [name], Destiny [name] NO.:  [id number]  MEDICAL RECORD NO.:  [id number]  LOCATION:  [facility]                         FACILITY:  [facility]  PHYSICIAN:  Georga Hacking, M.D.DATE OF BIRTH:  Feb 12, 1941  DATE OF ADMISSION:  10/23/2010                             HISTORY & PHYSICAL   REASON FOR ADMISSION:  Heart failure.  HISTORY:  The patient is a 70 year old female who has a history of a cardiomyopathy and thought to be nonischemic, but a catheterization in January 2003 had severe left ventricular dysfunction and had severe disease in the LAD and the right coronary artery, had bypass grafting by Dr. Rexanne Mano that year with a mammary graft to the LAD and vein graft to the first and second diagonal branch of the LAD and a vein graft to the right coronary artery.  Evidently, since then, she has had significant improvement in her left ventricular function and also had placement of a biventricular defibrillator in March 2007 with an EF of 20%.  Since then, echoes have shown ejection fractions of 55 and 60%. She has had several admissions for congestive heart failure mostly at [facility] in May 2011, June 2011, and August 2011.  She has a history of Coumadin therapy for LV thrombus also previously.  She has a history of diabetes, coronary artery disease, and hypertension.  She was at the beach last week and has some dietary non-discretion with some sodium and did not use her oxygen during the week, but on returning home noticed worsening dyspnea and began to use her oxygen more yesterday. She tonight presented to [facility] emergency room with worsening shortness of breath and the emergency room physician consulted the cardiologist.  She is transferred to [facility] for further treatment.  She had a change out of her biventricular defibrillator in April by Dr. Lewayne Bunting.  PAST MEDICAL HISTORY:  Remarkable for: 1. Type 2 diabetes mellitus. 2.  Coronary artery disease previously. 3. Hypertension. 4. Obstructive sleep apnea. 5. Diabetes with retinopathy. 6. Recurrent urinary tract infections. 7. Hypothyroidism. 8. Hyperlipidemia.  PAST SURGICAL HISTORY:  Cholecystectomy, hysterectomy, AICD placement, and coronary artery bypass grafting.  ALLERGIES: 1. SULFA. 2. NIACIN.  CURRENT MEDICATIONS: 1. Amlodipine 5 mg daily. 2. Aspirin 81 mg daily. 3. Calcium daily. 4. Carvedilol 25 mg b.i.d. 5. Vitamin D 1000 units daily. 6. Colace 100 mg b.i.d. as needed. 7. Enalapril 10 mg daily. 8. Fentanyl patch which is tried to be tapered down to 25 mg. 9. Gabapentin 100 mg b.i.d. 10.Novolin N 30 units b.i.d. 11.Humulin R 30 units twice daily before meals. 12.Levothyroxine 25 mcg daily. 13.Lorazepam 0.5 mg at bedtime. 14.Multivitamins daily. 15.Omega-3 fatty acids 2 capsules daily. 16.Crestor 10 mg daily. 17.Demadex 20 mg, take 30 mg by mouth 2 times daily. 18.Warfarin per protocol, current dose is 7.5 mg daily.  SOCIAL HISTORY:  She currently lives with her husband.  She is quite inactive at home.  She is not currently smoking.  Does not use alcohol to excess.  FAMILY HISTORY:  Reviewed in the chart and is unchanged.  REVIEW OF SYSTEMS:  She has been obese for years and sedentary.  She has diabetic  retinopathy, but no other visible complaints.  No other eye, ear, nose, or throat complaints.  No difficulty swallowing.  She has chronic dyspnea and wears CPAP at night and occasionally wears oxygen if she becomes dyspneic.  She has no diarrhea, hematochezia, abdominal pain, or melena.  Has a history of recurrent urinary tract infections, some urinary frequency, occasional nocturia, significant arthritis with low back pain.  Uses fentanyl patch.  Occasional arthritis elsewhere. No history of stroke or TIA.  Other than as noted above, the remainder of systems is unremarkable.  PHYSICAL EXAMINATION:  GENERAL:  She is a very large  obese female who is currently not complaining of shortness of breath. VITAL SIGNS:  She was saturating 85% when she arrived, but with oxygen therapy her saturations are now approaching normal.  Blood pressure was 155/75.  Pulse is currently 64 and regular. SKIN:  Warm and dry, somewhat pale.  No obvious mass or lesions. ENT:  EOMI.  PERRLA.  C and S clear.  Fundi not examined.  Pharynx is negative. NECK:  JVP is difficult to assess due to her obesity. LUNGS:  Increased AP diameter.  Reduced breath sounds.  Very faint crackles at the bases. CARDIOVASCULAR:  Normal S1 and S2.  There is no S3 noted. ABDOMEN:  Quite large and soft.  No gross abdominal masses are noted. No tenderness is noted. EXTREMITIES:  Femoral pulses are deep and not really felt well, but distal pulses are 2+.  There is 1 to 2+ edema bilaterally.  Previous saphenous harvesting scars noted. NEUROLOGIC:  Normal cranial nerves.  Sensory and motor are grossly intact.  A 12-lead EKG shows paced rhythm.  Chest x-ray shows congestive heart failure.  LABORATORY DATA:  White count 15,100, 81% segmented cells.  Protime INR is 2.78.  BUN is 30, creatinine 1.12.  B-natriuretic peptide is 3784.  IMPRESSION: 1. Acute systolic and diastolic congestive heart failure. 2. History of ischemic cardiomyopathy with improvement with     biventricular defibrillation implant. 3. Coronary artery disease with previous bypass grafting. 4. Diabetes mellitus with retinopathy and neuropathy. 5. Hypertensive heart disease. 6. Hyperlipidemia, under treatment. 7. Chronic pain syndrome, on fentanyl patch. 8. Hypothyroidism, under treatment.  RECOMMENDATIONS:  The patient is transferred to the Step-Down Unit.  She will be placed on IV nitroglycerin.  She will weigh daily, be given intravenous Lasix.  Continue home medicines for congestive heart failure.     Georga Hacking, M.D.     WST/MEDQ  D:  10/23/2010  T:  10/23/2010  Job:   [id number]  cc:   Learta Codding, MD,FACC Barbette Hair. Artist Pais, DO  Electronically Signed by Lacretia Nicks. Donnie Aho M.D. on 11/14/2010 05:08:13 PM

## 2010-11-23 ENCOUNTER — Telehealth: Payer: Self-pay | Admitting: Internal Medicine

## 2010-11-23 NOTE — Telephone Encounter (Signed)
Patient is unsure whether she needs labs prior to 8-23 follow up appt.

## 2010-11-24 NOTE — Telephone Encounter (Signed)
Per 4/23 office note, pt will need BMP and TSH prior to 01/04/11 appt with Dr Rodena Medin. Pt states she will go to [facility] lab. Appt entered.

## 2010-11-28 ENCOUNTER — Ambulatory Visit (INDEPENDENT_AMBULATORY_CARE_PROVIDER_SITE_OTHER): Payer: Medicare Other | Admitting: *Deleted

## 2010-11-28 DIAGNOSIS — I4891 Unspecified atrial fibrillation: Secondary | ICD-10-CM

## 2010-11-28 DIAGNOSIS — I6350 Cerebral infarction due to unspecified occlusion or stenosis of unspecified cerebral artery: Secondary | ICD-10-CM

## 2010-11-28 DIAGNOSIS — I238 Other current complications following acute myocardial infarction: Secondary | ICD-10-CM

## 2010-11-28 LAB — POCT INR: INR: 3.9

## 2010-12-12 ENCOUNTER — Encounter: Payer: Self-pay | Admitting: Internal Medicine

## 2010-12-12 ENCOUNTER — Ambulatory Visit (INDEPENDENT_AMBULATORY_CARE_PROVIDER_SITE_OTHER): Payer: Medicare Other | Admitting: *Deleted

## 2010-12-12 ENCOUNTER — Ambulatory Visit: Payer: Medicare Other | Admitting: Cardiology

## 2010-12-12 DIAGNOSIS — I6350 Cerebral infarction due to unspecified occlusion or stenosis of unspecified cerebral artery: Secondary | ICD-10-CM

## 2010-12-12 DIAGNOSIS — I238 Other current complications following acute myocardial infarction: Secondary | ICD-10-CM

## 2010-12-12 DIAGNOSIS — I4891 Unspecified atrial fibrillation: Secondary | ICD-10-CM

## 2010-12-19 ENCOUNTER — Encounter: Payer: Self-pay | Admitting: Internal Medicine

## 2010-12-19 ENCOUNTER — Ambulatory Visit (INDEPENDENT_AMBULATORY_CARE_PROVIDER_SITE_OTHER): Payer: Medicare Other | Admitting: Internal Medicine

## 2010-12-19 DIAGNOSIS — I255 Ischemic cardiomyopathy: Secondary | ICD-10-CM

## 2010-12-19 DIAGNOSIS — I4891 Unspecified atrial fibrillation: Secondary | ICD-10-CM

## 2010-12-19 DIAGNOSIS — Z95810 Presence of automatic (implantable) cardiac defibrillator: Secondary | ICD-10-CM

## 2010-12-19 DIAGNOSIS — I509 Heart failure, unspecified: Secondary | ICD-10-CM

## 2010-12-19 DIAGNOSIS — I2589 Other forms of chronic ischemic heart disease: Secondary | ICD-10-CM

## 2010-12-19 NOTE — Progress Notes (Signed)
HPI Destiny [name] returns today for followup. She is a pleasant morbidly obese 70 year old woman with a long-standing cardiomyopathy and chronic systolic heart failure. She is status post biventricular ICD implantation. She has had no defibrillator shocks. She denies chest pain. She has class II congestive heart failure symptoms. She admits to being quite sedentary and has difficulty exercising secondary to arthritis in her knees and hips. She is deconditioned. She has had no syncope. Allergies  Allergen Reactions  . Sulfonamide Derivatives      Current Outpatient Prescriptions  Medication Sig Dispense Refill  . amLODipine (NORVASC) 5 MG tablet Take 5 mg by mouth daily.        Marland Kitchen aspirin 81 MG tablet Take 81 mg by mouth daily.        . Calcium 600-200 MG-UNIT per tablet Take 1 tablet by mouth daily.        . carvedilol (COREG) 25 MG tablet TAKE TWO TABLETS BY MOUTH TWICE DAILY  360 tablet  1  . Cholecalciferol (VITAMIN D) 1000 UNITS capsule Take 1,000 Units by mouth daily.        . citalopram (CELEXA) 20 MG tablet Take 20 mg by mouth daily.        Marland Kitchen docusate sodium (COLACE) 100 MG capsule Take 100 mg by mouth 2 (two) times daily as needed.       . enalapril (VASOTEC) 10 MG tablet Take 1 tablet (10 mg total) by mouth daily.  30 tablet  3  . gabapentin (NEURONTIN) 100 MG capsule One caps twice daily      . insulin NPH (NOVOLIN N) 100 UNIT/ML injection Inject 30 Units into the skin 2 (two) times daily.  10 mL  3  . insulin regular (HUMULIN R) 100 UNIT/ML injection Inject 30 Units into the skin 2 (two) times daily before a meal.       . Insulin Syringe-Needle U-100 (B-D INS SYR ULTRAFINE 1CC/30G) 30G X 1/2" 1 ML MISC Use for insulin injection four times a day (250.02)       . levothyroxine (SYNTHROID, LEVOTHROID) 25 MCG tablet Take 1 tablet (25 mcg total) by mouth daily.  30 tablet  3  . LORazepam (ATIVAN) 0.5 MG tablet Take 1 tablet (0.5 mg total) by mouth at bedtime.  30 tablet  3  . Multiple  Vitamins-Minerals (MULTIVITAMIN WITH MINERALS) tablet Take 1 tablet by mouth daily.        . Omega-3 Fatty Acids (FISH OIL) 1200 MG CAPS Take 2 capsules by mouth 2 (two) times daily.        . rosuvastatin (CRESTOR) 10 MG tablet Take 10 mg by mouth daily.        Marland Kitchen torsemide (DEMADEX) 20 MG tablet Take 1.5 tablets (30 mg total) by mouth 2 (two) times daily.  90 tablet  3  . warfarin (COUMADIN) 5 MG tablet Take as directed by Anticoagulation clinic   45 tablet  3     Past Medical History  Diagnosis Date  . ICD (implantable cardiac defibrillator) in place   . LBBB (left bundle branch block)   . Carotid bruit   . Dyslipidemia   . Hypertension   . CHF (congestive heart failure)     Diastolic heart failure but with history of variable ejection fractions most recent ejection fraction normal mild LVH June 2012 status post recent hospitalization in June for heart failure with volume overload  . Stroke   . Back pain, chronic   . Sleep apnea   .  Cardiomyopathy, ischemic     Status post coronary bypass grafting no recurrent chest pain  . Cough   . History of recurrent UTIs   . Cystitis, acute   . Rhinosinusitis   . Dysuria   . Thyroid disease     hypothyroidism  . 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 CRI (chronic renal insufficiency)   . 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 DDD (degenerative disc disease)     multilevel  . Lymphoma, non Hodgkin's     In remission  . Biventricular implantable cardiac defibrillator in situ     Status post device revision secondary to end of life  . Hypoxemia requiring supplemental oxygen   . Candidiasis of vulva and vagina     ROS:   All systems reviewed and negative except as noted in the HPI.   Past Surgical History  Procedure Date  . Abdominal hysterectomy 1982  . Cholecystectomy 1970  . Coronary artery bypass graft 2003  . Tubal ligation      Family History  Problem  Relation Age of Onset  . Cancer Mother     kidney and female repo  . Heart disease Father   . Asthma Mother      History   Social History  . Marital Status: Married    Spouse Name: N/A    Number of Children: Y  . Years of Education: N/A   Occupational History  . 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    Social History Main Topics  . Smoking status: Never Smoker   . Smokeless tobacco: Never Used  . Alcohol Use: No  . Drug Use: No  . Sexually Active: Not on file   Other Topics Concern  . Not on file   Social History Narrative   Regular Exercise:  No     BP 116/49  Pulse 60  Ht 5\' 4"  (1.626 m)  Wt 255 lb (115.667 kg)  BMI 43.77 kg/m2  Physical Exam:  Morbidly obese appearing NAD HEENT: Unremarkable Neck:  No JVD, no thyromegally Lymphatics:  No adenopathy Back:  No CVA tenderness Lungs:  Clear. Well-healed ICD incision. HEART:  Regular rate rhythm, no murmurs, no rubs, no clicks Abd:  soft, positive bowel sounds, no organomegally, no rebound, no guarding Ext:  2 plus pulses, no edema, no cyanosis, no clubbing Skin:  No rashes no nodules Neuro:  CN II through XII intact, motor grossly intact  DEVICE  Normal device function.  See PaceArt for details.   Assess/Plan:

## 2010-12-19 NOTE — Assessment & Plan Note (Signed)
She denies anginal symptoms. She is unfortunately a fairly sedentary. I've encouraged her to look into ways that she might become more active either by finding a pool to swim in or by riding an exercise bike. She did not seem particularly motivated.

## 2010-12-19 NOTE — Patient Instructions (Signed)
Your physician wants you to follow-up in: May 2013 with Dr Court Joy will receive a reminder letter in the mail two months in advance. If you don't receive a letter, please call our office to schedule the follow-up appointment.   Remote monitoring is used to monitor your Pacemaker of ICD from home. This monitoring reduces the number of office visits required to check your device to one time per year. It allows [place] to keep an eye on the functioning of your device to ensure it is working properly. You are scheduled for a device check from home on 03/22/2011. You may send your transmission at any time that day. If you have a wireless device, the transmission will be sent automatically. After your physician reviews your transmission, you will receive a postcard with your next transmission date.

## 2010-12-19 NOTE — Assessment & Plan Note (Signed)
Her device is working normally. We'll plan to recheck in several months. 

## 2010-12-22 ENCOUNTER — Telehealth: Payer: Self-pay | Admitting: Internal Medicine

## 2010-12-22 MED ORDER — AMLODIPINE BESYLATE 5 MG PO TABS: 5.0000 mg | ORAL_TABLET | Freq: Every day | ORAL | Status: DC

## 2010-12-22 NOTE — Telephone Encounter (Signed)
rx refill sent to pharmacy 

## 2010-12-22 NOTE — Telephone Encounter (Signed)
Refill- amlodipine besylate 5mg  tab. Take one tablet by mouth once a day. Qty 90. Last fill 5.1.12

## 2010-12-26 ENCOUNTER — Ambulatory Visit: Payer: PRIVATE HEALTH INSURANCE

## 2010-12-26 DIAGNOSIS — I6350 Cerebral infarction due to unspecified occlusion or stenosis of unspecified cerebral artery: Secondary | ICD-10-CM

## 2010-12-26 DIAGNOSIS — E119 Type 2 diabetes mellitus without complications: Secondary | ICD-10-CM

## 2010-12-26 DIAGNOSIS — E039 Hypothyroidism, unspecified: Secondary | ICD-10-CM

## 2010-12-26 DIAGNOSIS — I4891 Unspecified atrial fibrillation: Secondary | ICD-10-CM

## 2010-12-26 DIAGNOSIS — I238 Other current complications following acute myocardial infarction: Secondary | ICD-10-CM

## 2010-12-26 LAB — BASIC METABOLIC PANEL
BUN: 38 mg/dL — ABNORMAL HIGH (ref 6–23)
Calcium: 9 mg/dL (ref 8.4–10.5)
GFR: 50.34 mL/min — ABNORMAL LOW (ref >60.00)
Potassium: 5 mEq/L (ref 3.5–5.1)
Sodium: 137 mEq/L (ref 135–145)

## 2010-12-26 LAB — TSH: TSH: 2.99 u[IU]/mL (ref 0.35–5.50)

## 2010-12-27 ENCOUNTER — Encounter: Payer: Self-pay | Admitting: Internal Medicine

## 2011-01-02 ENCOUNTER — Ambulatory Visit (INDEPENDENT_AMBULATORY_CARE_PROVIDER_SITE_OTHER): Payer: Medicare Other | Admitting: *Deleted

## 2011-01-02 DIAGNOSIS — I4891 Unspecified atrial fibrillation: Secondary | ICD-10-CM

## 2011-01-02 DIAGNOSIS — I238 Other current complications following acute myocardial infarction: Secondary | ICD-10-CM

## 2011-01-02 DIAGNOSIS — I6350 Cerebral infarction due to unspecified occlusion or stenosis of unspecified cerebral artery: Secondary | ICD-10-CM

## 2011-01-04 ENCOUNTER — Telehealth: Payer: Self-pay | Admitting: Internal Medicine

## 2011-01-04 ENCOUNTER — Ambulatory Visit (INDEPENDENT_AMBULATORY_CARE_PROVIDER_SITE_OTHER): Payer: Medicare Other | Admitting: Internal Medicine

## 2011-01-04 ENCOUNTER — Encounter: Payer: Self-pay | Admitting: Internal Medicine

## 2011-01-04 ENCOUNTER — Ambulatory Visit: Payer: Medicare Other | Admitting: Internal Medicine

## 2011-01-04 DIAGNOSIS — G629 Polyneuropathy, unspecified: Secondary | ICD-10-CM

## 2011-01-04 DIAGNOSIS — E039 Hypothyroidism, unspecified: Secondary | ICD-10-CM

## 2011-01-04 DIAGNOSIS — G589 Mononeuropathy, unspecified: Secondary | ICD-10-CM

## 2011-01-04 DIAGNOSIS — E785 Hyperlipidemia, unspecified: Secondary | ICD-10-CM

## 2011-01-04 MED ORDER — GABAPENTIN 100 MG PO CAPS: ORAL_CAPSULE | ORAL | Status: DC

## 2011-01-04 NOTE — Telephone Encounter (Signed)
Lab orders entered for Destiny [name] for the week of 03/26/2011

## 2011-01-04 NOTE — Telephone Encounter (Signed)
Please fax order for labs to [facility] for week of 11 14 she has appt with hodgin on 11-20 @10am 

## 2011-01-04 NOTE — Patient Instructions (Signed)
Please schedule cbc, chem7, a1c, urine microalbumin 250.0 and lipid/lft 272.4 prior to next visit

## 2011-01-07 DIAGNOSIS — G629 Polyneuropathy, unspecified: Secondary | ICD-10-CM | POA: Insufficient documentation

## 2011-01-07 NOTE — Progress Notes (Signed)
  Subjective:    [name], female    DOB: 1940/07/04, 70 y.o.   MRN: [id number]  HPI Pt presents to clinic for followup of multiple medical problems. H/o PAF maintained on coumadin without gross active bleeding. Reviewed last a1c 7.1. Diabetic eye exam utd. Needs annual mammogram but states will schedule. tsh reviewed nl 8/12 with h/o hypothyroidism.  Wt and bp stable. No active complaints  Past Medical History  Diagnosis Date  . ICD (implantable cardiac defibrillator) in place   . LBBB (left bundle branch block)   . Carotid bruit   . Dyslipidemia   . Hypertension   . CHF (congestive heart failure)     Diastolic heart failure but with history of variable ejection fractions most recent ejection fraction normal mild LVH June 2012 status post recent hospitalization in June for heart failure with volume overload  . Stroke   . Back pain, chronic   . Sleep apnea   . Cardiomyopathy, ischemic     Status post coronary bypass grafting no recurrent chest pain  . Cough   . History of recurrent UTIs   . Cystitis, acute   . Rhinosinusitis   . Dysuria   . Thyroid disease     hypothyroidism  . 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 CRI (chronic renal insufficiency)   . 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 DDD (degenerative disc disease)     multilevel  . Lymphoma, non Hodgkin's     In remission  . Biventricular implantable cardiac defibrillator in situ     Status post device revision secondary to end of life  . Hypoxemia requiring supplemental oxygen   . Candidiasis of vulva and vagina    Past Surgical History  Procedure Date  . Abdominal hysterectomy 1982  . Cholecystectomy 1970  . Coronary artery bypass graft 2003  . Tubal ligation     reports that she has never smoked. She has never used smokeless tobacco. She reports that she does not drink alcohol or use illicit drugs. family history includes Asthma  in her mother; Cancer in her mother; and Heart disease in her father. Allergies  Allergen Reactions  . Sulfonamide Derivatives      Review of Systems see hpi     Objective:   Physical Exam  Physical Exam  Nursing note and vitals reviewed. Constitutional: Appears well-developed and well-nourished. No distress.  HENT:  Head: Normocephalic and atraumatic.  Right Ear: External ear normal.  Left Ear: External ear normal.  Eyes: Conjunctivae are normal. No scleral icterus.  Neck: Neck supple. Carotid bruit is not present.  Cardiovascular: Normal rate, regular rhythm and normal heart sounds.  Exam reveals no gallop and no friction rub.   No murmur heard. Pulmonary/Chest: Effort normal and breath sounds normal. No respiratory distress. He has no wheezes. no rales.  Lymphadenopathy:    He has no cervical adenopathy.  Neurological:Alert.  Skin: Skin is warm and dry. Not diaphoretic.  Psychiatric: Has a normal mood and affect.   Diabetic foot exam: decreased sensation by monofilament bilateral plantar feet.      Assessment & Plan:

## 2011-01-07 NOTE — Assessment & Plan Note (Signed)
Refill neurontin. Prescription for diabetic shoes.

## 2011-01-07 NOTE — Assessment & Plan Note (Signed)
Average control. With underlying cad recommend a1c ~7.

## 2011-01-07 NOTE — Assessment & Plan Note (Signed)
Stable. Asx. Continue current dosing.

## 2011-01-16 ENCOUNTER — Ambulatory Visit (INDEPENDENT_AMBULATORY_CARE_PROVIDER_SITE_OTHER): Payer: Medicare Other | Admitting: *Deleted

## 2011-01-16 DIAGNOSIS — I238 Other current complications following acute myocardial infarction: Secondary | ICD-10-CM

## 2011-01-16 DIAGNOSIS — I6350 Cerebral infarction due to unspecified occlusion or stenosis of unspecified cerebral artery: Secondary | ICD-10-CM

## 2011-01-16 DIAGNOSIS — I4891 Unspecified atrial fibrillation: Secondary | ICD-10-CM

## 2011-01-16 LAB — POCT INR: INR: 2.8

## 2011-01-25 ENCOUNTER — Encounter (HOSPITAL_BASED_OUTPATIENT_CLINIC_OR_DEPARTMENT_OTHER): Payer: Medicare Other | Admitting: Oncology

## 2011-01-25 ENCOUNTER — Other Ambulatory Visit: Payer: Self-pay | Admitting: Oncology

## 2011-01-25 DIAGNOSIS — C8595 Non-Hodgkin lymphoma, unspecified, lymph nodes of inguinal region and lower limb: Secondary | ICD-10-CM

## 2011-01-25 DIAGNOSIS — I10 Essential (primary) hypertension: Secondary | ICD-10-CM

## 2011-01-25 DIAGNOSIS — E119 Type 2 diabetes mellitus without complications: Secondary | ICD-10-CM

## 2011-01-25 LAB — CBC WITH DIFFERENTIAL/PLATELET
BASO%: 0.3 % (ref 0.0–2.0)
Eosinophils Absolute: 0.2 10*3/uL (ref 0.0–0.5)
MONO#: 0.7 10*3/uL (ref 0.1–0.9)
NEUT#: 3.9 10*3/uL (ref 1.5–6.5)
Platelets: 164 10*3/uL (ref 145–400)
RBC: 4.07 10*6/uL (ref 3.70–5.45)
RDW: 15 % — ABNORMAL HIGH (ref 11.2–14.5)
WBC: 6.3 10*3/uL (ref 3.9–10.3)
lymph#: 1.6 10*3/uL (ref 0.9–3.3)

## 2011-01-25 LAB — LACTATE DEHYDROGENASE: LDH: 191 U/L (ref 94–250)

## 2011-01-26 ENCOUNTER — Telehealth: Payer: Self-pay | Admitting: *Deleted

## 2011-01-26 ENCOUNTER — Telehealth: Payer: Self-pay | Admitting: Internal Medicine

## 2011-01-26 DIAGNOSIS — I10 Essential (primary) hypertension: Secondary | ICD-10-CM

## 2011-01-26 DIAGNOSIS — T82198A Other mechanical complication of other cardiac electronic device, initial encounter: Secondary | ICD-10-CM

## 2011-01-26 MED ORDER — TORSEMIDE 20 MG PO TABS: 30.0000 mg | ORAL_TABLET | Freq: Two times a day (BID) | ORAL | Status: DC

## 2011-01-26 NOTE — Telephone Encounter (Signed)
Rx refill sent to pharmacy. 

## 2011-01-26 NOTE — Telephone Encounter (Signed)
Checking lead 

## 2011-01-26 NOTE — Telephone Encounter (Signed)
Refill-torsemide 20mg  tab. Take 1 and 1/2 tablets by mouth twice daily. Qty 90. Last fill 7.25.12

## 2011-01-31 ENCOUNTER — Ambulatory Visit (INDEPENDENT_AMBULATORY_CARE_PROVIDER_SITE_OTHER)
Admission: RE | Admit: 2011-01-31 | Discharge: 2011-01-31 | Disposition: A | Discharge status: Home / Self Care | Payer: Medicare Other | Source: Ambulatory Visit | Attending: Internal Medicine | Admitting: Internal Medicine

## 2011-01-31 DIAGNOSIS — T82198A Other mechanical complication of other cardiac electronic device, initial encounter: Secondary | ICD-10-CM

## 2011-02-01 ENCOUNTER — Telehealth: Payer: Self-pay | Admitting: Internal Medicine

## 2011-02-01 DIAGNOSIS — E039 Hypothyroidism, unspecified: Secondary | ICD-10-CM

## 2011-02-01 MED ORDER — LEVOTHYROXINE SODIUM 25 MCG PO TABS: 25.0000 ug | ORAL_TABLET | Freq: Every day | ORAL | Status: DC

## 2011-02-01 MED ORDER — INSULIN REGULAR HUMAN 100 UNIT/ML IJ SOLN: 25.0000 [IU] | Freq: Two times a day (BID) | INTRAMUSCULAR | Status: DC

## 2011-02-01 NOTE — Telephone Encounter (Signed)
REFILL HUMOLOG R 1000/ML INJ #20 LAST FILL 12-22-10

## 2011-02-01 NOTE — Telephone Encounter (Signed)
REFILL LEVOTHYROXINE SODIUM TAB #30 TAKE 1 TABLET BY MOUTH ONCE A DAY LAST FILL 01-01-11

## 2011-02-01 NOTE — Telephone Encounter (Signed)
Call placed to patient at [phone], to verify which insulin refill needed. Patient has verified that she needs Humulin R filled.  Rx refills sent to pharmacy.

## 2011-02-13 ENCOUNTER — Encounter: Payer: Medicare Other | Admitting: *Deleted

## 2011-02-20 ENCOUNTER — Encounter: Payer: Medicare Other | Admitting: *Deleted

## 2011-02-27 ENCOUNTER — Ambulatory Visit (INDEPENDENT_AMBULATORY_CARE_PROVIDER_SITE_OTHER): Payer: Medicare Other | Admitting: *Deleted

## 2011-02-27 DIAGNOSIS — I6350 Cerebral infarction due to unspecified occlusion or stenosis of unspecified cerebral artery: Secondary | ICD-10-CM

## 2011-02-27 DIAGNOSIS — I4891 Unspecified atrial fibrillation: Secondary | ICD-10-CM

## 2011-02-27 DIAGNOSIS — I238 Other current complications following acute myocardial infarction: Secondary | ICD-10-CM

## 2011-02-27 LAB — POCT INR: INR: 2.7

## 2011-03-03 ENCOUNTER — Other Ambulatory Visit: Payer: Self-pay | Admitting: Internal Medicine

## 2011-03-05 NOTE — Telephone Encounter (Signed)
Rx refill sent to pharmacy. 

## 2011-03-22 ENCOUNTER — Ambulatory Visit (INDEPENDENT_AMBULATORY_CARE_PROVIDER_SITE_OTHER): Payer: Medicare Other | Admitting: *Deleted

## 2011-03-22 DIAGNOSIS — Z95810 Presence of automatic (implantable) cardiac defibrillator: Secondary | ICD-10-CM

## 2011-03-22 DIAGNOSIS — I2589 Other forms of chronic ischemic heart disease: Secondary | ICD-10-CM

## 2011-03-23 ENCOUNTER — Encounter: Payer: Self-pay | Admitting: Internal Medicine

## 2011-03-27 ENCOUNTER — Encounter: Payer: Medicare Other | Admitting: *Deleted

## 2011-03-28 ENCOUNTER — Ambulatory Visit: Payer: Medicare Other

## 2011-03-28 DIAGNOSIS — E785 Hyperlipidemia, unspecified: Secondary | ICD-10-CM

## 2011-03-28 LAB — BASIC METABOLIC PANEL
CO2: 25 mEq/L (ref 19–32)
Calcium: 9.2 mg/dL (ref 8.4–10.5)
Chloride: 102 mEq/L (ref 96–112)
Glucose, Bld: 164 mg/dL — ABNORMAL HIGH (ref 70–99)
Potassium: 4.3 mEq/L (ref 3.5–5.1)
Sodium: 138 mEq/L (ref 135–145)

## 2011-03-28 LAB — CBC WITH DIFFERENTIAL/PLATELET
Basophils Absolute: 0 10*3/uL (ref 0.0–0.1)
Eosinophils Relative: 2.6 % (ref 0.0–5.0)
HCT: 38.7 % (ref 36.0–46.0)
Lymphocytes Relative: 20.9 % (ref 12.0–46.0)
Lymphs Abs: 1.5 10*3/uL (ref 0.7–4.0)
Monocytes Relative: 8.8 % (ref 3.0–12.0)
Neutrophils Relative %: 67.2 % (ref 43.0–77.0)
Platelets: 181 10*3/uL (ref 150.0–400.0)
RDW: 14 % (ref 11.5–14.6)
WBC: 7 10*3/uL (ref 4.5–10.5)

## 2011-03-28 LAB — LIPID PANEL
Cholesterol: 254 mg/dL — ABNORMAL HIGH (ref 0–200)
HDL: 45.7 mg/dL (ref >39.00)
Total CHOL/HDL Ratio: 6
Triglycerides: 682 mg/dL — ABNORMAL HIGH (ref 0.0–149.0)

## 2011-03-28 LAB — HEPATIC FUNCTION PANEL
AST: 17 U/L (ref 0–37)
Albumin: 3.4 g/dL — ABNORMAL LOW (ref 3.5–5.2)
Total Bilirubin: 0.7 mg/dL (ref 0.3–1.2)

## 2011-03-28 LAB — LDL CHOLESTEROL, DIRECT: Direct LDL: 82.4 mg/dL

## 2011-03-29 ENCOUNTER — Ambulatory Visit (INDEPENDENT_AMBULATORY_CARE_PROVIDER_SITE_OTHER): Payer: Medicare Other | Admitting: *Deleted

## 2011-03-29 DIAGNOSIS — I238 Other current complications following acute myocardial infarction: Secondary | ICD-10-CM

## 2011-03-29 DIAGNOSIS — I6350 Cerebral infarction due to unspecified occlusion or stenosis of unspecified cerebral artery: Secondary | ICD-10-CM

## 2011-03-29 DIAGNOSIS — I4891 Unspecified atrial fibrillation: Secondary | ICD-10-CM

## 2011-03-29 LAB — MICROALBUMIN / CREATININE URINE RATIO
Creatinine,U: 68.3 mg/dL
Microalb, Ur: 77 mg/dL — ABNORMAL HIGH (ref 0.0–1.9)

## 2011-03-30 NOTE — Progress Notes (Signed)
Remote icd check  

## 2011-04-03 ENCOUNTER — Telehealth: Payer: Self-pay | Admitting: Internal Medicine

## 2011-04-03 ENCOUNTER — Encounter: Payer: Self-pay | Admitting: Internal Medicine

## 2011-04-03 ENCOUNTER — Ambulatory Visit (INDEPENDENT_AMBULATORY_CARE_PROVIDER_SITE_OTHER): Payer: Medicare Other | Admitting: Internal Medicine

## 2011-04-03 VITALS — BP 116/64 | HR 74 | Temp 98.2°F | Resp 20 | Wt 261.0 lb

## 2011-04-03 DIAGNOSIS — E785 Hyperlipidemia, unspecified: Secondary | ICD-10-CM

## 2011-04-03 DIAGNOSIS — E119 Type 2 diabetes mellitus without complications: Secondary | ICD-10-CM

## 2011-04-03 MED ORDER — CHOLINE FENOFIBRATE 135 MG PO CPDR: 135.0000 mg | DELAYED_RELEASE_CAPSULE | Freq: Every day | ORAL | Status: DC

## 2011-04-03 MED ORDER — ROSUVASTATIN CALCIUM 10 MG PO TABS: 10.0000 mg | ORAL_TABLET | Freq: Every day | ORAL | Status: DC

## 2011-04-03 NOTE — Progress Notes (Signed)
  Subjective:    [name], female    DOB: February 23, 1941, 70 y.o.   MRN: [id number]  HPI Pt presents to clinic for followup of multiple medical problems. Reviewed TG elevated 682 with typical range b/t 418-870 and pt states peak of 3000 in the past. Tolerating statin tx but not taking fibrate-denies past difficulty with fibrate. BP reviewed as nl. Not checking fsbs. A1c remains elevated. Urine microalbumin improving. Mammogram due and pt defers clinic scheduling stating she will make the appt. No active complaint.  Past Medical History  Diagnosis Date  . ICD (implantable cardiac defibrillator) in place   . LBBB (left bundle branch block)   . Carotid bruit   . Dyslipidemia   . Hypertension   . CHF (congestive heart failure)     Diastolic heart failure but with history of variable ejection fractions most recent ejection fraction normal mild LVH June 2012 status post recent hospitalization in June for heart failure with volume overload  . Stroke   . Back pain, chronic   . Sleep apnea   . Cardiomyopathy, ischemic     Status post coronary bypass grafting no recurrent chest pain  . Cough   . History of recurrent UTIs   . Cystitis, acute   . Rhinosinusitis   . Dysuria   . Thyroid disease     hypothyroidism  . 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 CRI (chronic renal insufficiency)   . 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 DDD (degenerative disc disease)     multilevel  . Lymphoma, non Hodgkin's     In remission  . Biventricular implantable cardiac defibrillator in situ     Status post device revision secondary to end of life  . Hypoxemia requiring supplemental oxygen   . Candidiasis of vulva and vagina    Past Surgical History  Procedure Date  . Abdominal hysterectomy 1982  . Cholecystectomy 1970  . Coronary artery bypass graft 2003  . Tubal ligation     reports that she has never smoked. She has  never used smokeless tobacco. She reports that she does not drink alcohol or use illicit drugs. family history includes Asthma in her mother; Cancer in her mother; and Heart disease in her father. Allergies  Allergen Reactions  . Sulfonamide Derivatives      Review of Systems see hpi     Objective:   Physical Exam  Physical Exam  Nursing note and vitals reviewed. Constitutional: Appears well-developed and well-nourished. No distress.  HENT:  Head: Normocephalic and atraumatic.  Right Ear: External ear normal.  Left Ear: External ear normal.  Eyes: Conjunctivae are normal. No scleral icterus.  Neck: Neck supple. Carotid bruit is not present.  Cardiovascular: Normal rate, regular rhythm and normal heart sounds.  Exam reveals no gallop and no friction rub.   No murmur heard. Pulmonary/Chest: Effort normal and breath sounds normal. No respiratory distress. He has no wheezes. no rales.  Lymphadenopathy:    He has no cervical adenopathy.  Neurological:Alert.  Skin: Skin is warm and dry. Not diaphoretic.  Psychiatric: Has a normal mood and affect.        Assessment & Plan:

## 2011-04-03 NOTE — Telephone Encounter (Signed)
Future order placed at [facility] for the week of 08/20/11.

## 2011-04-03 NOTE — Patient Instructions (Signed)
Please schedule chem7, a1c 250.0 and lipid/lft 272.4 prior to next appointment 

## 2011-04-06 ENCOUNTER — Encounter: Payer: Self-pay | Admitting: *Deleted

## 2011-04-06 ENCOUNTER — Telehealth: Payer: Self-pay | Admitting: Internal Medicine

## 2011-04-06 ENCOUNTER — Other Ambulatory Visit: Payer: Self-pay | Admitting: Internal Medicine

## 2011-04-06 DIAGNOSIS — I10 Essential (primary) hypertension: Secondary | ICD-10-CM

## 2011-04-06 MED ORDER — ENALAPRIL MALEATE 10 MG PO TABS: 10.0000 mg | ORAL_TABLET | Freq: Every day | ORAL | Status: DC

## 2011-04-06 NOTE — Telephone Encounter (Signed)
Refill- enalapril maleate 10mg  tab. Take one tablet by mouth once a day. Qty 90 last fill 8.20.12

## 2011-04-06 NOTE — Telephone Encounter (Signed)
Rx refill sent to [facility] pharmacy.

## 2011-04-08 NOTE — Assessment & Plan Note (Signed)
Attempt trilipex qd. Low fat diet and continue statin tx

## 2011-04-08 NOTE — Assessment & Plan Note (Signed)
Reinforced the need for daily fsbs monitoring. Pt instructed to call clinic with fsbs report within 2 wks.

## 2011-04-13 ENCOUNTER — Telehealth: Payer: Self-pay | Admitting: Internal Medicine

## 2011-04-13 MED ORDER — INSULIN NPH (HUMAN) (ISOPHANE) 100 UNIT/ML ~~LOC~~ SUSP: 30.0000 [IU] | Freq: Two times a day (BID) | SUBCUTANEOUS | Status: DC

## 2011-04-13 MED ORDER — INSULIN REGULAR HUMAN 100 UNIT/ML IJ SOLN: 30.0000 [IU] | Freq: Two times a day (BID) | INTRAMUSCULAR | Status: DC

## 2011-04-13 NOTE — Telephone Encounter (Signed)
Patient states that she is trying to refill novolin but insurance says that its too soon. Patient says that Rx says 25 units twice a day but patient states that Dr. Rodena Medin upped the dosage to 30 units twice a day at last visit. That is why patient is running out. Please send refill to [facility].

## 2011-04-13 NOTE — Telephone Encounter (Signed)
Rx refill sent to pharmacy. 

## 2011-04-16 ENCOUNTER — Encounter: Payer: Self-pay | Admitting: Internal Medicine

## 2011-05-07 ENCOUNTER — Telehealth: Payer: Self-pay | Admitting: Internal Medicine

## 2011-05-07 MED ORDER — GABAPENTIN 100 MG PO CAPS: ORAL_CAPSULE | ORAL | Status: DC

## 2011-05-07 NOTE — Telephone Encounter (Signed)
Refill-gabapentin 100mg  cap. Take one capsule by mouth twice daily. Qty 60 last fill 11.23.12

## 2011-05-07 NOTE — Telephone Encounter (Signed)
Rx refill sent to pharmacy. 

## 2011-05-10 ENCOUNTER — Ambulatory Visit (INDEPENDENT_AMBULATORY_CARE_PROVIDER_SITE_OTHER): Payer: Medicare Other | Admitting: *Deleted

## 2011-05-10 DIAGNOSIS — I6350 Cerebral infarction due to unspecified occlusion or stenosis of unspecified cerebral artery: Secondary | ICD-10-CM

## 2011-05-10 DIAGNOSIS — I4891 Unspecified atrial fibrillation: Secondary | ICD-10-CM

## 2011-05-10 DIAGNOSIS — I238 Other current complications following acute myocardial infarction: Secondary | ICD-10-CM

## 2011-05-10 LAB — POCT INR: INR: 4.1

## 2011-05-18 ENCOUNTER — Telehealth: Payer: Self-pay | Admitting: Oncology

## 2011-05-18 NOTE — Telephone Encounter (Signed)
S/w pt today re appt for 1/25.

## 2011-05-29 ENCOUNTER — Telehealth: Payer: Self-pay | Admitting: Internal Medicine

## 2011-05-29 NOTE — Telephone Encounter (Signed)
New Msg: pt calling wanting to know if she can bring renewal for pt handicap sticker up to office for MD to sign. Please return pt call to discuss further.

## 2011-05-29 NOTE — Telephone Encounter (Signed)
Spoke with husband and let him know Dr Ladona Ridgel will fill out form for her

## 2011-05-30 ENCOUNTER — Telehealth: Payer: Self-pay | Admitting: Internal Medicine

## 2011-05-30 DIAGNOSIS — E039 Hypothyroidism, unspecified: Secondary | ICD-10-CM

## 2011-05-30 DIAGNOSIS — I10 Essential (primary) hypertension: Secondary | ICD-10-CM

## 2011-05-30 NOTE — Telephone Encounter (Signed)
Patient is requesting refills on citalopram,carvedilol, torsemide, amlodipine, and levothyroxine to be sent to [facility] on [address]

## 2011-05-31 ENCOUNTER — Telehealth: Payer: Self-pay | Admitting: Internal Medicine

## 2011-05-31 ENCOUNTER — Ambulatory Visit (INDEPENDENT_AMBULATORY_CARE_PROVIDER_SITE_OTHER): Payer: Medicare Other | Admitting: *Deleted

## 2011-05-31 DIAGNOSIS — I4891 Unspecified atrial fibrillation: Secondary | ICD-10-CM

## 2011-05-31 DIAGNOSIS — I238 Other current complications following acute myocardial infarction: Secondary | ICD-10-CM | POA: Diagnosis not present

## 2011-05-31 DIAGNOSIS — I6350 Cerebral infarction due to unspecified occlusion or stenosis of unspecified cerebral artery: Secondary | ICD-10-CM | POA: Diagnosis not present

## 2011-05-31 MED ORDER — WARFARIN SODIUM 5 MG PO TABS: 5.0000 mg | ORAL_TABLET | ORAL | Status: DC

## 2011-05-31 MED ORDER — AMLODIPINE BESYLATE 5 MG PO TABS: 5.0000 mg | ORAL_TABLET | Freq: Every day | ORAL | Status: DC

## 2011-05-31 MED ORDER — CITALOPRAM HYDROBROMIDE 20 MG PO TABS: 20.0000 mg | ORAL_TABLET | Freq: Every day | ORAL | Status: DC

## 2011-05-31 MED ORDER — CARVEDILOL 25 MG PO TABS: 25.0000 mg | ORAL_TABLET | Freq: Two times a day (BID) | ORAL | Status: DC

## 2011-05-31 MED ORDER — TORSEMIDE 20 MG PO TABS: 30.0000 mg | ORAL_TABLET | Freq: Two times a day (BID) | ORAL | Status: DC

## 2011-05-31 MED ORDER — LEVOTHYROXINE SODIUM 25 MCG PO TABS: 25.0000 ug | ORAL_TABLET | Freq: Every day | ORAL | Status: DC

## 2011-05-31 NOTE — Telephone Encounter (Signed)
Rx refills sent to pharmacy. 

## 2011-05-31 NOTE — Telephone Encounter (Signed)
Walk in Pt Form " pt Dropped off Disability Pla Card needs to be completed" sent to [facility]  05/31/11/KM

## 2011-06-08 ENCOUNTER — Ambulatory Visit: Payer: Medicare Other | Admitting: Oncology

## 2011-06-08 ENCOUNTER — Other Ambulatory Visit: Payer: Medicare Other | Admitting: Lab

## 2011-06-13 ENCOUNTER — Telehealth: Payer: Self-pay | Admitting: *Deleted

## 2011-06-13 NOTE — Telephone Encounter (Signed)
ok 

## 2011-06-13 NOTE — Telephone Encounter (Signed)
Fax received from [facility] pharmacy on [address] requesting permission to change patient from Humlin R ( # 10- 25 units  Sq bid before a meal) Insulin to [facility]ion Novolin R

## 2011-06-14 MED ORDER — INSULIN REGULAR HUMAN 100 UNIT/ML IJ SOLN: 10.0000 [IU] | Freq: Three times a day (TID) | INTRAMUSCULAR | Status: DC

## 2011-06-14 NOTE — Telephone Encounter (Signed)
Rx change sent to pharmacy 

## 2011-06-18 ENCOUNTER — Ambulatory Visit (INDEPENDENT_AMBULATORY_CARE_PROVIDER_SITE_OTHER): Payer: Medicare Other | Admitting: Family

## 2011-06-18 ENCOUNTER — Encounter: Payer: Self-pay | Admitting: Family

## 2011-06-18 VITALS — BP 116/70 | HR 60 | Temp 98.1°F | Resp 18 | Wt 266.0 lb

## 2011-06-18 DIAGNOSIS — M79674 Pain in right toe(s): Secondary | ICD-10-CM

## 2011-06-18 DIAGNOSIS — M79609 Pain in unspecified limb: Secondary | ICD-10-CM | POA: Diagnosis not present

## 2011-06-18 DIAGNOSIS — M109 Gout, unspecified: Secondary | ICD-10-CM

## 2011-06-18 MED ORDER — COLCHICINE 0.6 MG PO TABS: 0.6000 mg | ORAL_TABLET | Freq: Every day | ORAL | Status: DC

## 2011-06-18 NOTE — Assessment & Plan Note (Signed)
Obtain uric acid level.  Pt is instructed to take 2 tabs now, followed by 1 tab an hour later.  She can repeat this tomorrow as needed.  Call if symptoms worsen or do not improve.  She was advised re: side effect of diarrhea.

## 2011-06-18 NOTE — Patient Instructions (Signed)
Please complete your blood work prior to leaving. Call if your symptoms worsen or if no improvement with use of Colcrys.

## 2011-06-18 NOTE — Progress Notes (Signed)
Subjective:    [name], female    DOB: 07/01/1940, 71 y.o.   MRN: [id number]  HPI  Ms.  Gerhart is a 71 yr old female who presents today with chief complaint of right toe pain.  Pain is located in the distal joint of the right great toe.  She denies hx of gout.  Symptoms started >1 month ago and pain has worsened over the last 1 week..  Pain is constant, worse with walking, improved by nothing.  She reports that pain is severe and that her vicodin that she uses for her back "hasn't touched the pain."   Review of Systems Past Medical History  Diagnosis Date  . ICD (implantable cardiac defibrillator) in place   . LBBB (left bundle branch block)   . Carotid bruit   . Dyslipidemia   . Hypertension   . CHF (congestive heart failure)     Diastolic heart failure but with history of variable ejection fractions most recent ejection fraction normal mild LVH June 2012 status post recent hospitalization in June for heart failure with volume overload  . Stroke   . Back pain, chronic   . Sleep apnea   . Cardiomyopathy, ischemic     Status post coronary bypass grafting no recurrent chest pain  . Cough   . History of recurrent UTIs   . Cystitis, acute   . Rhinosinusitis   . Dysuria   . Thyroid disease     hypothyroidism  . 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 CRI (chronic renal insufficiency)   . 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 DDD (degenerative disc disease)     multilevel  . Lymphoma, non Hodgkin's     In remission  . Biventricular implantable cardiac defibrillator in situ     Status post device revision secondary to end of life  . Hypoxemia requiring supplemental oxygen   . Candidiasis of vulva and vagina     History   Social History  . Marital Status: Married    Spouse Name: N/A    Number of Children: Y  . Years of Education: N/A   Occupational History  . 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    Social History Main Topics  . Smoking status: Never Smoker   . Smokeless tobacco: Never Used  . Alcohol Use: No  . Drug Use: No  . Sexually Active: Not on file   Other Topics Concern  . Not on file   Social History Narrative   Regular Exercise:  No    Past Surgical History  Procedure Date  . Abdominal hysterectomy 1982  . Cholecystectomy 1970  . Coronary artery bypass graft 2003  . Tubal ligation     Family History  Problem Relation Age of Onset  . Cancer Mother     kidney and female repo  . Heart disease Father   . Asthma Mother     Allergies  Allergen Reactions  . Sulfonamide Derivatives     Current Outpatient Prescriptions on File Prior to Visit  Medication Sig Dispense Refill  . amLODipine (NORVASC) 5 MG tablet Take 1 tablet (5 mg total) by mouth daily.  30 tablet  3  . aspirin 81 MG tablet Take 81 mg by mouth daily.        . Calcium 600-200 MG-UNIT per tablet Take 1 tablet by mouth  daily.        . carvedilol (COREG) 25 MG tablet Take 1 tablet (25 mg total) by mouth 2 (two) times daily with a meal.  60 tablet  3  . Cholecalciferol (VITAMIN D) 1000 UNITS capsule Take 1,000 Units by mouth daily.        . Choline Fenofibrate (TRILIPIX) 135 MG capsule Take 1 capsule (135 mg total) by mouth daily.  30 capsule  6  . citalopram (CELEXA) 20 MG tablet Take 1 tablet (20 mg total) by mouth daily.  30 tablet  3  . docusate sodium (COLACE) 100 MG capsule Take 100 mg by mouth 2 (two) times daily as needed.       . enalapril (VASOTEC) 10 MG tablet Take 1 tablet (10 mg total) by mouth daily.  90 tablet  1  . gabapentin (NEURONTIN) 100 MG capsule One caps twice daily  60 capsule  3  . HYDROcodone-acetaminophen (NORCO) 5-325 MG per tablet Take 1 tablet by mouth every 6 (six) hours as needed.        . insulin NPH (HUMULIN N,NOVOLIN N) 100 UNIT/ML injection Inject 30 Units into the skin 2 (two) times daily.  10 mL  5  . insulin regular (NOVOLIN R RELION) 100 units/mL  injection Inject 0.1-0.25 mLs (10-25 Units total) into the skin 3 (three) times daily before meals.  10 mL  6  . Insulin Syringe-Needle U-100 (B-D INS SYR ULTRAFINE 1CC/30G) 30G X 1/2" 1 ML MISC Use for insulin injection four times a day (250.02)       . levothyroxine (SYNTHROID, LEVOTHROID) 25 MCG tablet Take 1 tablet (25 mcg total) by mouth daily.  30 tablet  3  . LORazepam (ATIVAN) 0.5 MG tablet Take 1 tablet (0.5 mg total) by mouth at bedtime.  30 tablet  3  . Multiple Vitamins-Minerals (MULTIVITAMIN WITH MINERALS) tablet Take 1 tablet by mouth daily.        . Omega-3 Fatty Acids (FISH OIL) 1200 MG CAPS Take 2 capsules by mouth 2 (two) times daily.        . rosuvastatin (CRESTOR) 10 MG tablet Take 1 tablet (10 mg total) by mouth daily.  30 tablet  6  . torsemide (DEMADEX) 20 MG tablet Take 1.5 tablets (30 mg total) by mouth 2 (two) times daily.  90 tablet  3  . warfarin (COUMADIN) 5 MG tablet Take 1 tablet (5 mg total) by mouth as directed.  45 tablet  3  . warfarin (COUMADIN) 5 MG tablet Take 1 tablet (5 mg total) by mouth as directed.  45 tablet  3    BP 116/70  Pulse 60  Temp(Src) 98.1 F (36.7 C) (Oral)  Resp 18  Wt 266 lb 0.6 oz (120.675 kg)  SpO2 92%       Objective:   Physical Exam  Constitutional: She appears well-developed and well-nourished. No distress.  HENT:  Head: Normocephalic and atraumatic.  Cardiovascular: Normal rate and regular rhythm.   No murmur heard. Pulmonary/Chest: Effort normal and breath sounds normal. No respiratory distress. She has no wheezes. She has no rales. She exhibits no tenderness.  Musculoskeletal: She exhibits no edema.       Right great toe- no swelling, redness.    Skin: Skin is warm and dry.  Psychiatric: She has a normal mood and affect. Her behavior is normal. Judgment and thought content normal.          Assessment & Plan:

## 2011-06-19 ENCOUNTER — Encounter: Payer: Self-pay | Admitting: Family

## 2011-06-21 ENCOUNTER — Ambulatory Visit (INDEPENDENT_AMBULATORY_CARE_PROVIDER_SITE_OTHER): Payer: Medicare Other | Admitting: *Deleted

## 2011-06-21 DIAGNOSIS — I2589 Other forms of chronic ischemic heart disease: Secondary | ICD-10-CM | POA: Diagnosis not present

## 2011-06-21 DIAGNOSIS — Z95810 Presence of automatic (implantable) cardiac defibrillator: Secondary | ICD-10-CM

## 2011-06-21 DIAGNOSIS — I255 Ischemic cardiomyopathy: Secondary | ICD-10-CM

## 2011-06-22 ENCOUNTER — Encounter: Payer: Self-pay | Admitting: Internal Medicine

## 2011-06-22 LAB — REMOTE ICD DEVICE
AL AMPLITUDE: 2.7 mv
AL IMPEDENCE ICD: 340 Ohm
BAMS-0003: 70 {beats}/min
DEVICE MODEL ICD: 631685
HV IMPEDENCE: 49 Ohm
LV LEAD IMPEDENCE ICD: 280 Ohm
MODE SWITCH EPISODES: 17
RV LEAD AMPLITUDE: 11.7 mv
TZAT-0001SLOWVT: 1
TZAT-0012FASTVT: 200 ms
TZAT-0012SLOWVT: 200 ms
TZAT-0013FASTVT: 2
TZAT-0018FASTVT: NEGATIVE
TZAT-0020SLOWVT: 1 ms
TZON-0003FASTVT: 310 ms
TZON-0005SLOWVT: 6
TZST-0001FASTVT: 3
TZST-0001FASTVT: 5
TZST-0001SLOWVT: 4
TZST-0001SLOWVT: 5
TZST-0003FASTVT: 36 J
TZST-0003FASTVT: 40 J
TZST-0003FASTVT: 40 J
TZST-0003SLOWVT: 15 J

## 2011-06-25 ENCOUNTER — Telehealth: Payer: Self-pay | Admitting: *Deleted

## 2011-06-25 MED ORDER — COLCHICINE 0.6 MG PO TABS: ORAL_TABLET | ORAL | Status: DC

## 2011-06-25 NOTE — Telephone Encounter (Signed)
Received call from pt stating the swelling has gone down in her toe but she continues to have intermittent sharp pain in that toe. States she is unable to take any more Colcrys due to extreme diarrhea. Wants to know if we can call something else in as she is afraid it is going to get flare up again. Please advise.

## 2011-06-25 NOTE — Telephone Encounter (Signed)
Spoke to pt.  Gout pain is much improved, just has occasional sharp pain in toe.  Had bad diarrhea following the colcrys regimen.  I recommended that she take colcrys one table daily for the next few days, then keep on hand as needed for flares. She verbalizes understanding.

## 2011-06-26 ENCOUNTER — Other Ambulatory Visit (INDEPENDENT_AMBULATORY_CARE_PROVIDER_SITE_OTHER): Payer: Medicare Other

## 2011-06-26 DIAGNOSIS — E785 Hyperlipidemia, unspecified: Secondary | ICD-10-CM | POA: Diagnosis not present

## 2011-06-26 DIAGNOSIS — E119 Type 2 diabetes mellitus without complications: Secondary | ICD-10-CM

## 2011-06-26 LAB — BASIC METABOLIC PANEL
BUN: 65 mg/dL — ABNORMAL HIGH (ref 6–23)
CO2: 29 mEq/L (ref 19–32)
Calcium: 9.2 mg/dL (ref 8.4–10.5)
Chloride: 101 mEq/L (ref 96–112)
Creatinine, Ser: 1.9 mg/dL — ABNORMAL HIGH (ref 0.4–1.2)

## 2011-06-26 LAB — LIPID PANEL
Cholesterol: 209 mg/dL — ABNORMAL HIGH (ref 0–200)
HDL: 43.8 mg/dL (ref >39.00)
Triglycerides: 628 mg/dL — ABNORMAL HIGH (ref 0.0–149.0)
VLDL: 125.6 mg/dL — ABNORMAL HIGH (ref 0.0–40.0)

## 2011-06-26 LAB — HEPATIC FUNCTION PANEL
Albumin: 3.5 g/dL (ref 3.5–5.2)
Total Bilirubin: 0.5 mg/dL (ref 0.3–1.2)

## 2011-06-26 LAB — HEMOGLOBIN A1C: Hgb A1c MFr Bld: 7.7 % — ABNORMAL HIGH (ref 4.6–6.5)

## 2011-06-28 ENCOUNTER — Ambulatory Visit (INDEPENDENT_AMBULATORY_CARE_PROVIDER_SITE_OTHER): Payer: Medicare Other | Admitting: *Deleted

## 2011-06-28 DIAGNOSIS — I4891 Unspecified atrial fibrillation: Secondary | ICD-10-CM

## 2011-06-28 DIAGNOSIS — I6350 Cerebral infarction due to unspecified occlusion or stenosis of unspecified cerebral artery: Secondary | ICD-10-CM | POA: Diagnosis not present

## 2011-06-28 DIAGNOSIS — I238 Other current complications following acute myocardial infarction: Secondary | ICD-10-CM

## 2011-06-29 NOTE — Progress Notes (Signed)
ICD remote 

## 2011-07-03 ENCOUNTER — Ambulatory Visit (INDEPENDENT_AMBULATORY_CARE_PROVIDER_SITE_OTHER): Payer: Medicare Other | Admitting: Internal Medicine

## 2011-07-03 ENCOUNTER — Encounter: Payer: Self-pay | Admitting: Internal Medicine

## 2011-07-03 DIAGNOSIS — M109 Gout, unspecified: Secondary | ICD-10-CM

## 2011-07-03 DIAGNOSIS — R0989 Other specified symptoms and signs involving the circulatory and respiratory systems: Secondary | ICD-10-CM | POA: Diagnosis not present

## 2011-07-03 DIAGNOSIS — E785 Hyperlipidemia, unspecified: Secondary | ICD-10-CM

## 2011-07-03 DIAGNOSIS — R0600 Dyspnea, unspecified: Secondary | ICD-10-CM | POA: Insufficient documentation

## 2011-07-03 DIAGNOSIS — N289 Disorder of kidney and ureter, unspecified: Secondary | ICD-10-CM

## 2011-07-03 DIAGNOSIS — R0609 Other forms of dyspnea: Secondary | ICD-10-CM | POA: Diagnosis not present

## 2011-07-03 DIAGNOSIS — I509 Heart failure, unspecified: Secondary | ICD-10-CM

## 2011-07-03 DIAGNOSIS — E119 Type 2 diabetes mellitus without complications: Secondary | ICD-10-CM

## 2011-07-03 MED ORDER — CHOLINE FENOFIBRATE 135 MG PO CPDR: 135.0000 mg | DELAYED_RELEASE_CAPSULE | Freq: Every day | ORAL | Status: DC

## 2011-07-03 MED ORDER — ALLOPURINOL 100 MG PO TABS: 100.0000 mg | ORAL_TABLET | Freq: Every day | ORAL | Status: DC

## 2011-07-03 NOTE — Assessment & Plan Note (Signed)
Repeat chem7 

## 2011-07-03 NOTE — Progress Notes (Signed)
  Subjective:    [name], female    DOB: Sep 14, 1940, 71 y.o.   MRN: [id number]  HPI Pt presents to clinic for followup of multiple medical problems. Diabetic control remains suboptimal. Taking regular insulin only bid. Last 6 a1c's above 7. Reviewed chem7 with elevated cr with nl baseline. TG remain 600's with past peak of >1000. Did not price or begin trilipex. Having gout attacks of toes. Improves with colchicine but has diarrhea from repeated dosing. Notes wt gain associated with mild dyspnea.  Past Medical History  Diagnosis Date  . ICD (implantable cardiac defibrillator) in place   . LBBB (left bundle branch block)   . Carotid bruit   . Dyslipidemia   . Hypertension   . CHF (congestive heart failure)     Diastolic heart failure but with history of variable ejection fractions most recent ejection fraction normal mild LVH June 2012 status post recent hospitalization in June for heart failure with volume overload  . Stroke   . Back pain, chronic   . Sleep apnea   . Cardiomyopathy, ischemic     Status post coronary bypass grafting no recurrent chest pain  . Cough   . History of recurrent UTIs   . Cystitis, acute   . Rhinosinusitis   . Dysuria   . Thyroid disease     hypothyroidism  . 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 CRI (chronic renal insufficiency)   . 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 DDD (degenerative disc disease)     multilevel  . Lymphoma, non Hodgkin's     In remission  . Biventricular implantable cardiac defibrillator in situ     Status post device revision secondary to end of life  . Hypoxemia requiring supplemental oxygen   . Candidiasis of vulva and vagina    Past Surgical History  Procedure Date  . Abdominal hysterectomy 1982  . Cholecystectomy 1970  . Coronary artery bypass graft 2003  . Tubal ligation     reports that she has never smoked. She has never used  smokeless tobacco. She reports that she does not drink alcohol or use illicit drugs. family history includes Asthma in her mother; Cancer in her mother; and Heart disease in her father. Allergies  Allergen Reactions  . Sulfonamide Derivatives       Review of Systems see hpi     Objective:   Physical Exam  Physical Exam  Nursing note and vitals reviewed. Constitutional: Appears well-developed and well-nourished. No distress.  HENT:  Head: Normocephalic and atraumatic.  Right Ear: External ear normal.  Left Ear: External ear normal.  Eyes: Conjunctivae are normal. No scleral icterus.  Neck: Neck supple. Carotid bruit is not present.  Cardiovascular: Normal rate, regular rhythm and normal heart sounds.  Exam reveals no gallop and no friction rub.   No murmur heard. Pulmonary/Chest: Effort normal and breath sounds normal. No respiratory distress. He has no wheezes. no rales.  Lymphadenopathy:    He has no cervical adenopathy.  Neurological:Alert.  Skin: Skin is warm and dry. Not diaphoretic.  Psychiatric: Has a normal mood and affect.  Diabetic foot exam: +2 DP pulses, no diabetic wounds, ulcerations or significant callousing. Monofilament exam nl.       Assessment & Plan:

## 2011-07-03 NOTE — Assessment & Plan Note (Signed)
Obtain BNP 

## 2011-07-03 NOTE — Assessment & Plan Note (Signed)
Colchicine bid prn attacks. Begin allopurinol 100mg  po qd

## 2011-07-03 NOTE — Assessment & Plan Note (Signed)
Take regular insulin with each meal. Discussed nph and regular insulin actions. Proceed with endocrinology referral

## 2011-07-03 NOTE — Assessment & Plan Note (Signed)
Encouraged to begin trilipex to address TG

## 2011-07-04 ENCOUNTER — Ambulatory Visit: Payer: Medicare Other | Admitting: Internal Medicine

## 2011-07-04 LAB — BASIC METABOLIC PANEL
BUN: 57 mg/dL — ABNORMAL HIGH (ref 6–23)
CO2: 27 mEq/L (ref 19–32)
Calcium: 9 mg/dL (ref 8.4–10.5)
Creat: 1.64 mg/dL — ABNORMAL HIGH (ref 0.50–1.10)
Glucose, Bld: 222 mg/dL — ABNORMAL HIGH (ref 70–99)
Sodium: 139 mEq/L (ref 135–145)

## 2011-07-11 ENCOUNTER — Encounter: Payer: Self-pay | Admitting: *Deleted

## 2011-07-12 ENCOUNTER — Ambulatory Visit (INDEPENDENT_AMBULATORY_CARE_PROVIDER_SITE_OTHER): Payer: Medicare Other | Admitting: Pharmacist

## 2011-07-12 DIAGNOSIS — I4891 Unspecified atrial fibrillation: Secondary | ICD-10-CM | POA: Diagnosis not present

## 2011-07-12 DIAGNOSIS — I6350 Cerebral infarction due to unspecified occlusion or stenosis of unspecified cerebral artery: Secondary | ICD-10-CM

## 2011-07-12 DIAGNOSIS — I238 Other current complications following acute myocardial infarction: Secondary | ICD-10-CM | POA: Diagnosis not present

## 2011-07-23 ENCOUNTER — Telehealth: Payer: Self-pay | Admitting: Internal Medicine

## 2011-07-23 MED ORDER — "INSULIN SYRINGE-NEEDLE U-100 30G X 1/2"" 1 ML MISC": Status: DC

## 2011-07-23 NOTE — Telephone Encounter (Signed)
Patient states that she has switched pharmacies. New pharmacy is [facility] on [address] in [place]. She states that she needs a new rx of pen needles to be sent to [facility].

## 2011-07-23 NOTE — Telephone Encounter (Signed)
Rx sent to pharmacy   

## 2011-07-26 ENCOUNTER — Ambulatory Visit (INDEPENDENT_AMBULATORY_CARE_PROVIDER_SITE_OTHER): Payer: Medicare Other | Admitting: *Deleted

## 2011-07-26 ENCOUNTER — Ambulatory Visit (HOSPITAL_BASED_OUTPATIENT_CLINIC_OR_DEPARTMENT_OTHER): Payer: Medicare Other | Admitting: Oncology

## 2011-07-26 ENCOUNTER — Other Ambulatory Visit (HOSPITAL_BASED_OUTPATIENT_CLINIC_OR_DEPARTMENT_OTHER): Payer: Medicare Other | Admitting: Lab

## 2011-07-26 ENCOUNTER — Telehealth: Payer: Self-pay | Admitting: Oncology

## 2011-07-26 VITALS — BP 136/62 | HR 62 | Temp 97.7°F | Ht 64.0 in | Wt 262.0 lb

## 2011-07-26 DIAGNOSIS — I4891 Unspecified atrial fibrillation: Secondary | ICD-10-CM | POA: Diagnosis not present

## 2011-07-26 DIAGNOSIS — C8595 Non-Hodgkin lymphoma, unspecified, lymph nodes of inguinal region and lower limb: Secondary | ICD-10-CM

## 2011-07-26 DIAGNOSIS — I6350 Cerebral infarction due to unspecified occlusion or stenosis of unspecified cerebral artery: Secondary | ICD-10-CM

## 2011-07-26 DIAGNOSIS — G609 Hereditary and idiopathic neuropathy, unspecified: Secondary | ICD-10-CM

## 2011-07-26 DIAGNOSIS — I238 Other current complications following acute myocardial infarction: Secondary | ICD-10-CM

## 2011-07-26 DIAGNOSIS — C8589 Other specified types of non-Hodgkin lymphoma, extranodal and solid organ sites: Secondary | ICD-10-CM

## 2011-07-26 LAB — CBC WITH DIFFERENTIAL/PLATELET
BASO%: 0.4 % (ref 0.0–2.0)
MCHC: 34.2 g/dL (ref 31.5–36.0)
MONO#: 0.5 10*3/uL (ref 0.1–0.9)
RBC: 4.05 10*6/uL (ref 3.70–5.45)
WBC: 7.5 10*3/uL (ref 3.9–10.3)
lymph#: 2 10*3/uL (ref 0.9–3.3)

## 2011-07-26 LAB — POCT INR: INR: 4.5

## 2011-07-26 LAB — LACTATE DEHYDROGENASE: LDH: 230 U/L (ref 94–250)

## 2011-07-26 NOTE — Telephone Encounter (Signed)
appts made and printed for 01/2012     aom

## 2011-07-26 NOTE — Progress Notes (Signed)
OFFICE PROGRESS NOTE   INTERVAL HISTORY:   She returns as scheduled. She feels well. She has no complaint. She denies fever, night sweats, and anorexia. No recurrent pain or mass at the left thigh.  Objective:  Vital signs in last 24 hours:  Blood pressure 136/62, pulse 62, temperature 97.7 F (36.5 C), temperature source Oral, height 5\' 4"  (1.626 m), weight 262 lb (118.842 kg).    HEENT: Neck without mass Lymphatics: No cervical, supraclavicular, axillary, or inguinal nodes. No evidence of recurrent tumor at the left thigh. Resp: Lungs clear bilaterally Cardio: Regular rate and rhythm GI: Nontender, no hepatosplenomegaly Vascular: No leg edema   Lab Results:  Lab Results  Component Value Date   WBC 7.5 07/26/2011   HGB 12.7 07/26/2011   HCT 37.0 07/26/2011   MCV 91.5 07/26/2011   PLT 186 07/26/2011   ANC 4.8    Medications: I have reviewed the patient's current medications.  Assessment/Plan: 1. Non-Hodgkin's lymphoma, high-grade B-cell lymphoma, involving left inguinal mass, status post core biopsy 03/21/2009.  Staging PET scan on 03/16/2009 showed a left inguinal nodal conglomerate measuring 7 x 6.3 cm with intense hypermetabolic activity with additional hypermetabolic tumor and mass-like swelling within the muscle groups extending to the left knee with a permeative appearance of the distal femoral metaphysis and femoral condyles.  She completed 6 cycles of CHOP (Adriamycin was deleted and etoposide substituted) and rituximab with marked clinical and radiographic improvement. 2. Hospitalization 11/25 through 04/10/2009 with acute exacerbation of diastolic congestive heart failure, likely secondary to Lasix being held and intravenous fluids given with cycle 1 of chemotherapy. 3. Ischemic cardiomyopathy status post implantation of a biventricular ICD 08/02/2005 with improvement in the ejection fraction. 4. Insulin-dependent diabetes. 5. Hypertension. 6. Hypercholesterolemia.     7. Hypertriglyceridemia. 8. Sleep apnea on CPAP and home oxygen. 9. History of peripheral neuropathy, unchanged following chemotherapy. 10. Chronic low back pain followed by Dr. Ethelene Hal.   Disposition:   She remains in clinical remission from the non-Hodgkin's lymphoma. She will return for an office visit in 6 months. She will contact us in the interim for new symptoms.   Destiny Shutters, MD  07/26/2011  6:14 PM

## 2011-08-09 ENCOUNTER — Ambulatory Visit (INDEPENDENT_AMBULATORY_CARE_PROVIDER_SITE_OTHER): Payer: Medicare Other | Admitting: Pharmacist

## 2011-08-09 DIAGNOSIS — I238 Other current complications following acute myocardial infarction: Secondary | ICD-10-CM | POA: Diagnosis not present

## 2011-08-09 DIAGNOSIS — I4891 Unspecified atrial fibrillation: Secondary | ICD-10-CM

## 2011-08-09 DIAGNOSIS — I6350 Cerebral infarction due to unspecified occlusion or stenosis of unspecified cerebral artery: Secondary | ICD-10-CM | POA: Diagnosis not present

## 2011-08-09 LAB — POCT INR: INR: 3.4

## 2011-08-23 ENCOUNTER — Telehealth: Payer: Self-pay | Admitting: Internal Medicine

## 2011-08-23 MED ORDER — CARVEDILOL 25 MG PO TABS: ORAL_TABLET | ORAL | Status: DC

## 2011-08-23 NOTE — Telephone Encounter (Signed)
Previously she took carvedilol 25 mg two pills twice a day for a total of 100 mg per day.  When she picked up her new rx from [facility] the sig now reads 1 pill two times per day with a meal.  For a total of 50mg  per day.  Did  Dr Rodena Medin  Change the amount or was this a mistake.

## 2011-08-23 NOTE — Telephone Encounter (Signed)
Call returned to patient at [phone], she has verified the she is taking coreg 100 mg daily. She was informed Rx would be re-submitted to reflect current dosing and qty.  Rx corrected and resent to pharmacy.

## 2011-08-24 DIAGNOSIS — I10 Essential (primary) hypertension: Secondary | ICD-10-CM | POA: Diagnosis not present

## 2011-08-24 DIAGNOSIS — E039 Hypothyroidism, unspecified: Secondary | ICD-10-CM | POA: Diagnosis not present

## 2011-08-24 DIAGNOSIS — E7800 Pure hypercholesterolemia, unspecified: Secondary | ICD-10-CM | POA: Diagnosis not present

## 2011-08-27 ENCOUNTER — Ambulatory Visit (INDEPENDENT_AMBULATORY_CARE_PROVIDER_SITE_OTHER): Payer: Medicare Other | Admitting: *Deleted

## 2011-08-27 DIAGNOSIS — I238 Other current complications following acute myocardial infarction: Secondary | ICD-10-CM

## 2011-08-27 DIAGNOSIS — I4891 Unspecified atrial fibrillation: Secondary | ICD-10-CM

## 2011-08-27 DIAGNOSIS — I6350 Cerebral infarction due to unspecified occlusion or stenosis of unspecified cerebral artery: Secondary | ICD-10-CM

## 2011-09-05 ENCOUNTER — Telehealth: Payer: Self-pay | Admitting: Internal Medicine

## 2011-09-05 DIAGNOSIS — E039 Hypothyroidism, unspecified: Secondary | ICD-10-CM

## 2011-09-05 DIAGNOSIS — E785 Hyperlipidemia, unspecified: Secondary | ICD-10-CM

## 2011-09-05 DIAGNOSIS — IMO0001 Reserved for inherently not codable concepts without codable children: Secondary | ICD-10-CM

## 2011-09-05 NOTE — Telephone Encounter (Signed)
Chem7, a1c 250.0, lipid/lft 272.4 and tsh -hypothyroidism

## 2011-09-05 NOTE — Telephone Encounter (Signed)
Are there any labs needed for patient prior to her May appointment?

## 2011-09-05 NOTE — Telephone Encounter (Signed)
She's talking about trilipex likely. Might have samples to help out. Also don't see anything from endocrinology. Referred her to endocrine in feb for DM

## 2011-09-05 NOTE — Telephone Encounter (Signed)
Call placed to patient at [phone], patient expressed concerns of lab orders not being in place for blood draw. She stated that she has spoken with Marj 2 days earlier and was informed by Marj that labs were ordered for [place]. Patient was informed that message was not received regarding lab draw. She stated the lab tech was advised to have her come in fasting to appointment . She stated she preferred not to do that because she had started new medication and labs were being  Checked for that reason. She has rescheduled her appointment for 10/01/2011 with Dr Rodena Medin. She would like to have her cholesterol checked to see if the medication is working for her.  She stated the medication is costing her $70 a month to fill and did not want to continue taking the medication if it is not working. Patient was provided with my direct voice line , and was asked to call that number if she has any clinical questions.

## 2011-09-05 NOTE — Telephone Encounter (Signed)
Patient states that she is upset that she was told that she needed labs prior to 09/06/11 appointment with Dr. Rodena Medin. She went to lab and was told that there were no orders in system. Lab called [place] and patient states that we told her to come in fasting for this appointment.   Patient rescheduled this appointment for 10/01/11. She would like to know if she does need labs one week prior to this visit. She states that she will be going to the [facility] lab.

## 2011-09-06 ENCOUNTER — Ambulatory Visit: Payer: Medicare Other | Admitting: Internal Medicine

## 2011-09-06 NOTE — Telephone Encounter (Signed)
Lab orders entered

## 2011-09-11 ENCOUNTER — Ambulatory Visit (INDEPENDENT_AMBULATORY_CARE_PROVIDER_SITE_OTHER): Payer: Medicare Other | Admitting: *Deleted

## 2011-09-11 ENCOUNTER — Ambulatory Visit (INDEPENDENT_AMBULATORY_CARE_PROVIDER_SITE_OTHER): Payer: Medicare Other | Admitting: Pulmonary Disease

## 2011-09-11 ENCOUNTER — Encounter: Payer: Self-pay | Admitting: Pulmonary Disease

## 2011-09-11 VITALS — BP 134/74 | HR 99 | Temp 98.3°F | Ht 64.5 in | Wt 257.0 lb

## 2011-09-11 DIAGNOSIS — I6350 Cerebral infarction due to unspecified occlusion or stenosis of unspecified cerebral artery: Secondary | ICD-10-CM

## 2011-09-11 DIAGNOSIS — G4733 Obstructive sleep apnea (adult) (pediatric): Secondary | ICD-10-CM

## 2011-09-11 DIAGNOSIS — I238 Other current complications following acute myocardial infarction: Secondary | ICD-10-CM

## 2011-09-11 DIAGNOSIS — I4891 Unspecified atrial fibrillation: Secondary | ICD-10-CM

## 2011-09-11 NOTE — Assessment & Plan Note (Signed)
The patient is doing well with CPAP, and has had an excellent clinical response.  She is satisfied with her sleep and daytime alertness.  I've encouraged her to keep up with mask changes and supplies, and to work aggressively on weight loss.  If she is doing well, I will see her back in one year.

## 2011-09-11 NOTE — Progress Notes (Signed)
  Subjective:    [name], female    DOB: March 14, 1941, 71 y.o.   MRN: [id number]  HPI The patient comes in today for followup of her known obstructive sleep apnea.  She is wearing CPAP compliantly, and is having no issues with mask fit or pressure.  She feels that she is sleeping well, and is satisfied with her daytime alertness.  She has lost some weight since the last visit.   Review of Systems  Constitutional: Negative for fever and unexpected weight change.  HENT: Positive for congestion, rhinorrhea and sneezing. Negative for ear pain, nosebleeds, sore throat, trouble swallowing, dental problem, postnasal drip and sinus pressure.   Eyes: Positive for itching. Negative for redness.  Respiratory: Negative for cough, chest tightness, shortness of breath and wheezing.   Cardiovascular: Negative for palpitations and leg swelling.  Gastrointestinal: Negative for nausea and vomiting.  Genitourinary: Negative for dysuria.  Musculoskeletal: Negative for joint swelling.  Skin: Negative for rash.  Neurological: Negative for headaches.  Hematological: Bruises/bleeds easily.  Psychiatric/Behavioral: Negative for dysphoric mood. The patient is not nervous/anxious.        Objective:   Physical Exam Obese female in no acute distress No skin breakdown or pressure necrosis from the CPAP mask Lower extremities with mild edema, no cyanosis Alert and oriented, moves all 4 extremities.  Does not appear to be sleepy.       Assessment & Plan:

## 2011-09-11 NOTE — Patient Instructions (Signed)
Continue with cpap, and keep up with mask changes and supplies. Work on weight loss followup with me in one year, but call if having issues with cpap.

## 2011-09-13 ENCOUNTER — Telehealth: Payer: Self-pay | Admitting: Internal Medicine

## 2011-09-13 MED ORDER — GABAPENTIN 100 MG PO CAPS: 100.0000 mg | ORAL_CAPSULE | Freq: Two times a day (BID) | ORAL | Status: DC

## 2011-09-13 NOTE — Telephone Encounter (Signed)
Rx refill sent to pharmacy. 

## 2011-09-19 ENCOUNTER — Encounter: Payer: Self-pay | Admitting: Internal Medicine

## 2011-09-19 ENCOUNTER — Ambulatory Visit (INDEPENDENT_AMBULATORY_CARE_PROVIDER_SITE_OTHER): Payer: Medicare Other | Admitting: Internal Medicine

## 2011-09-19 VITALS — BP 126/58 | HR 65 | Ht 64.0 in | Wt 264.8 lb

## 2011-09-19 DIAGNOSIS — I4891 Unspecified atrial fibrillation: Secondary | ICD-10-CM

## 2011-09-19 DIAGNOSIS — I509 Heart failure, unspecified: Secondary | ICD-10-CM

## 2011-09-19 DIAGNOSIS — Z95810 Presence of automatic (implantable) cardiac defibrillator: Secondary | ICD-10-CM

## 2011-09-19 LAB — ICD DEVICE OBSERVATION
AL AMPLITUDE: 2.8 mv
AL IMPEDENCE ICD: 337.5 Ohm
ATRIAL PACING ICD: 87 pct
BAMS-0001: 180 {beats}/min
LV LEAD IMPEDENCE ICD: 287.5 Ohm
LV LEAD THRESHOLD: 1 V
MODE SWITCH EPISODES: 37
RV LEAD AMPLITUDE: 11.7 mv
RV LEAD THRESHOLD: 0.75 V
TOT-0007: 1
TOT-0008: 0
TZAT-0001SLOWVT: 1
TZAT-0004FASTVT: 8
TZAT-0013SLOWVT: 4
TZAT-0018FASTVT: NEGATIVE
TZAT-0020SLOWVT: 1 ms
TZON-0003FASTVT: 310 ms
TZON-0005SLOWVT: 6
TZON-0010FASTVT: 40 ms
TZON-0010SLOWVT: 40 ms
TZST-0001FASTVT: 2
TZST-0001FASTVT: 3
TZST-0001FASTVT: 5
TZST-0001SLOWVT: 2
TZST-0001SLOWVT: 4
TZST-0003FASTVT: 40 J
TZST-0003FASTVT: 40 J
TZST-0003SLOWVT: 30 J
TZST-0003SLOWVT: 40 J
VENTRICULAR PACING ICD: 99.84 pct

## 2011-09-19 NOTE — Assessment & Plan Note (Signed)
Her device is working normally. Will recheck in several months. 

## 2011-09-19 NOTE — Progress Notes (Signed)
HPI Destiny [name] returns today for followup. She is a pleasant 71 yo woman with a h/o chronic systolic CHF, LBBB, and morbid obesity. In the interim she has done well. She denies chest pain, sob, peripheral edema or syncope. No ICD shocks. Allergies  Allergen Reactions  . Sulfonamide Derivatives      Current Outpatient Prescriptions  Medication Sig Dispense Refill  . allopurinol (ZYLOPRIM) 100 MG tablet Take 1 tablet (100 mg total) by mouth daily.  30 tablet  6  . amLODipine (NORVASC) 5 MG tablet Take 1 tablet (5 mg total) by mouth daily.  30 tablet  3  . aspirin 81 MG tablet Take 81 mg by mouth daily.        . Calcium 600-200 MG-UNIT per tablet Take 1 tablet by mouth daily.        . carvedilol (COREG) 25 MG tablet Take 2 tablets by mouth twice a day  120 tablet  3  . Cholecalciferol (VITAMIN D) 1000 UNITS capsule Take 1,000 Units by mouth daily.        . Choline Fenofibrate (TRILIPIX) 135 MG capsule Take 1 capsule (135 mg total) by mouth daily.  30 capsule  6  . citalopram (CELEXA) 20 MG tablet Take 1 tablet (20 mg total) by mouth daily.  30 tablet  3  . colchicine 0.6 MG tablet Take 0.6 mg by mouth as needed.      . docusate sodium (COLACE) 100 MG capsule Take 100 mg by mouth 2 (two) times daily as needed.       . enalapril (VASOTEC) 10 MG tablet Take 1 tablet (10 mg total) by mouth daily.  90 tablet  1  . gabapentin (NEURONTIN) 100 MG capsule Take 1 capsule (100 mg total) by mouth 2 (two) times daily.  60 capsule  3  . HYDROcodone-acetaminophen (NORCO) 5-325 MG per tablet Take 1 tablet by mouth every 6 (six) hours as needed.        . insulin NPH (HUMULIN N,NOVOLIN N) 100 UNIT/ML injection 50 U every morning and 50 U at night      . insulin regular (NOVOLIN R,HUMULIN R) 100 units/mL injection Inject 20 Units into the skin 2 (two) times daily.       . Insulin Syringe-Needle U-100 (B-D INS SYR ULTRAFINE 1CC/30G) 30G X 1/2" 1 ML MISC Use for insulin injection five times a day (250.02)  200  each  6  . levothyroxine (SYNTHROID, LEVOTHROID) 25 MCG tablet Take 1 tablet (25 mcg total) by mouth daily.  30 tablet  3  . LORazepam (ATIVAN) 0.5 MG tablet Take 1 tablet (0.5 mg total) by mouth at bedtime.  30 tablet  3  . Multiple Vitamins-Minerals (MULTIVITAMIN WITH MINERALS) tablet Take 1 tablet by mouth daily.        . Omega-3 Fatty Acids (FISH OIL) 1200 MG CAPS Take 2 capsules by mouth 2 (two) times daily.        . rosuvastatin (CRESTOR) 10 MG tablet Take 1 tablet (10 mg total) by mouth daily.  30 tablet  6  . torsemide (DEMADEX) 20 MG tablet Take 1.5 tablets (30 mg total) by mouth 2 (two) times daily.  90 tablet  3  . warfarin (COUMADIN) 5 MG tablet Take 1 tablet (5 mg total) by mouth as directed.  45 tablet  3     Past Medical History  Diagnosis Date  . ICD (implantable cardiac defibrillator) in place   . LBBB (left bundle branch block)   .  Carotid bruit   . Dyslipidemia   . Hypertension   . CHF (congestive heart failure)     Diastolic heart failure but with history of variable ejection fractions most recent ejection fraction normal mild LVH June 2012 status post recent hospitalization in June for heart failure with volume overload  . Stroke   . Back pain, chronic   . Sleep apnea   . Cardiomyopathy, ischemic     Status post coronary bypass grafting no recurrent chest pain  . Cough   . History of recurrent UTIs   . Cystitis, acute   . Rhinosinusitis   . Dysuria   . Thyroid disease     hypothyroidism  . 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 CRI (chronic renal insufficiency)   . 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 DDD (degenerative disc disease)     multilevel  . Lymphoma, non Hodgkin's     In remission  . Biventricular implantable cardiac defibrillator in situ     Status post device revision secondary to end of life  . Hypoxemia requiring supplemental oxygen   . Candidiasis of vulva and vagina      ROS:   All systems reviewed and negative except as noted in the HPI.   Past Surgical History  Procedure Date  . Abdominal hysterectomy 1982  . Cholecystectomy 1970  . Coronary artery bypass graft 2003  . Tubal ligation   . Cardiac catheterization 06/03/01     Family History  Problem Relation Age of Onset  . Cancer Mother     kidney and female repo  . Heart disease Father   . Asthma Mother      History   Social History  . Marital Status: Married    Spouse Name: N/A    Number of Children: Y  . Years of Education: N/A   Occupational History  . 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    Social History Main Topics  . Smoking status: Never Smoker   . Smokeless tobacco: Never Used  . Alcohol Use: No  . Drug Use: No  . Sexually Active: Not on file   Other Topics Concern  . Not on file   Social History Narrative   Regular Exercise:  No     BP 126/58  Pulse 65  Ht 5\' 4"  (1.626 m)  Wt 264 lb 12.8 oz (120.112 kg)  BMI 45.45 kg/m2  Physical Exam:  Well appearing obese woman, NAD HEENT: Unremarkable Neck:  No JVD, no thyromegally Lymphatics:  No adenopathy Back:  No CVA tenderness Lungs:  Clear with no wheezes, rales, or rhonchi HEART:  Regular rate rhythm, no murmurs, no rubs, no clicks Abd:  soft, positive bowel sounds, no organomegally, no rebound, no guarding Ext:  2 plus pulses, no edema, no cyanosis, no clubbing Skin:  No rashes no nodules Neuro:  CN II through XII intact, motor grossly intact  DEVICE  Normal device function.  See PaceArt for details.   Assess/Plan:

## 2011-09-19 NOTE — Assessment & Plan Note (Signed)
She appears to be maintaining NSR. She will continue her current meds. 

## 2011-09-19 NOTE — Patient Instructions (Signed)
Your physician wants you to follow-up in: 12 months with Dr Taylor You will receive a reminder letter in the mail two months in advance. If you don't receive a letter, please call our office to schedule the follow-up appointment.   Remote monitoring is used to monitor your Pacemaker of ICD from home. This monitoring reduces the number of office visits required to check your device to one time per year. It allows us to keep an eye on the functioning of your device to ensure it is working properly. You are scheduled for a device check from home on 12/27/11. You may send your transmission at any time that day. If you have a wireless device, the transmission will be sent automatically. After your physician reviews your transmission, you will receive a postcard with your next transmission date.   

## 2011-09-25 ENCOUNTER — Other Ambulatory Visit (INDEPENDENT_AMBULATORY_CARE_PROVIDER_SITE_OTHER): Payer: Medicare Other

## 2011-09-25 DIAGNOSIS — E785 Hyperlipidemia, unspecified: Secondary | ICD-10-CM | POA: Diagnosis not present

## 2011-09-25 DIAGNOSIS — E039 Hypothyroidism, unspecified: Secondary | ICD-10-CM | POA: Diagnosis not present

## 2011-09-25 LAB — LIPID PANEL
Cholesterol: 152 mg/dL (ref 0–200)
Total CHOL/HDL Ratio: 3
Triglycerides: 231 mg/dL — ABNORMAL HIGH (ref 0.0–149.0)
VLDL: 46.2 mg/dL — ABNORMAL HIGH (ref 0.0–40.0)

## 2011-09-25 LAB — BASIC METABOLIC PANEL
CO2: 28 mEq/L (ref 19–32)
Chloride: 106 mEq/L (ref 96–112)
Creatinine, Ser: 1.7 mg/dL — ABNORMAL HIGH (ref 0.4–1.2)
Glucose, Bld: 132 mg/dL — ABNORMAL HIGH (ref 70–99)
Sodium: 143 mEq/L (ref 135–145)

## 2011-09-25 LAB — HEPATIC FUNCTION PANEL
Albumin: 3.4 g/dL — ABNORMAL LOW (ref 3.5–5.2)
Total Bilirubin: 0.5 mg/dL (ref 0.3–1.2)

## 2011-09-25 LAB — TSH: TSH: 2.78 u[IU]/mL (ref 0.35–5.50)

## 2011-10-01 ENCOUNTER — Encounter: Payer: Self-pay | Admitting: Internal Medicine

## 2011-10-01 ENCOUNTER — Telehealth: Payer: Self-pay | Admitting: Internal Medicine

## 2011-10-01 ENCOUNTER — Ambulatory Visit (INDEPENDENT_AMBULATORY_CARE_PROVIDER_SITE_OTHER): Payer: Medicare Other | Admitting: Internal Medicine

## 2011-10-01 VITALS — BP 118/60 | HR 69 | Temp 98.2°F | Resp 22 | Wt 268.0 lb

## 2011-10-01 DIAGNOSIS — R0989 Other specified symptoms and signs involving the circulatory and respiratory systems: Secondary | ICD-10-CM

## 2011-10-01 DIAGNOSIS — I10 Essential (primary) hypertension: Secondary | ICD-10-CM

## 2011-10-01 DIAGNOSIS — Z23 Encounter for immunization: Secondary | ICD-10-CM

## 2011-10-01 DIAGNOSIS — G4730 Sleep apnea, unspecified: Secondary | ICD-10-CM | POA: Diagnosis not present

## 2011-10-01 DIAGNOSIS — D649 Anemia, unspecified: Secondary | ICD-10-CM

## 2011-10-01 DIAGNOSIS — N289 Disorder of kidney and ureter, unspecified: Secondary | ICD-10-CM

## 2011-10-01 MED ORDER — LORAZEPAM 0.5 MG PO TABS: 0.5000 mg | ORAL_TABLET | Freq: Every day | ORAL | Status: DC

## 2011-10-01 NOTE — Patient Instructions (Signed)
Please schedule labs prior to next visit chem7-renal insufficiency and cbc, ferritin-anemia

## 2011-10-01 NOTE — Progress Notes (Signed)
  Subjective:    [name], female    DOB: 1941/03/08, 71 y.o.   MRN: [id number]  HPI Pt presents to clinic for followup of multiple medical problems. Now seeing endocrinology for DM. BP reviewed normotensive.   Past Medical History  Diagnosis Date  . ICD (implantable cardiac defibrillator) in place   . LBBB (left bundle branch block)   . Carotid bruit   . Dyslipidemia   . Hypertension   . CHF (congestive heart failure)     Diastolic heart failure but with history of variable ejection fractions most recent ejection fraction normal mild LVH June 2012 status post recent hospitalization in June for heart failure with volume overload  . Stroke   . Back pain, chronic   . Sleep apnea   . Cardiomyopathy, ischemic     Status post coronary bypass grafting no recurrent chest pain  . Cough   . History of recurrent UTIs   . Cystitis, acute   . Rhinosinusitis   . Dysuria   . Thyroid disease     hypothyroidism  . 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 CRI (chronic renal insufficiency)   . 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 DDD (degenerative disc disease)     multilevel  . Lymphoma, non Hodgkin's     In remission  . Biventricular implantable cardiac defibrillator in situ     Status post device revision secondary to end of life  . Hypoxemia requiring supplemental oxygen   . Candidiasis of vulva and vagina    Past Surgical History  Procedure Date  . Abdominal hysterectomy 1982  . Cholecystectomy 1970  . Coronary artery bypass graft 2003  . Tubal ligation   . Cardiac catheterization 06/03/01    reports that she has never smoked. She has never used smokeless tobacco. She reports that she does not drink alcohol or use illicit drugs. family history includes Asthma in her mother; Cancer in her mother; and Heart disease in her father. Allergies  Allergen Reactions  . Sulfonamide Derivatives        Review of Systems see hpi     Objective:   Physical Exam  Nursing note and vitals reviewed. Constitutional: She appears well-developed and well-nourished. No distress.  HENT:  Head: Normocephalic and atraumatic.  Right Ear: External ear normal.  Left Ear: External ear normal.  Eyes: Conjunctivae are normal. No scleral icterus.  Neck: Neck supple. No JVD present. Carotid bruit is present.       Left carotid bruit  Cardiovascular: Normal rate, regular rhythm and normal heart sounds.   Pulmonary/Chest: Effort normal and breath sounds normal. No respiratory distress. She has no wheezes. She has no rales.  Neurological: She is alert.  Skin: Skin is warm and dry. She is not diaphoretic.  Psychiatric: She has a normal mood and affect.          Assessment & Plan:

## 2011-10-02 ENCOUNTER — Ambulatory Visit (INDEPENDENT_AMBULATORY_CARE_PROVIDER_SITE_OTHER): Payer: Medicare Other | Admitting: *Deleted

## 2011-10-02 DIAGNOSIS — I4891 Unspecified atrial fibrillation: Secondary | ICD-10-CM | POA: Diagnosis not present

## 2011-10-02 DIAGNOSIS — I6350 Cerebral infarction due to unspecified occlusion or stenosis of unspecified cerebral artery: Secondary | ICD-10-CM

## 2011-10-02 DIAGNOSIS — I238 Other current complications following acute myocardial infarction: Secondary | ICD-10-CM | POA: Diagnosis not present

## 2011-10-02 NOTE — Telephone Encounter (Signed)
Lab orders entered for August of 2013.

## 2011-10-04 ENCOUNTER — Other Ambulatory Visit: Payer: Self-pay | Admitting: Cardiology

## 2011-10-04 ENCOUNTER — Telehealth: Payer: Self-pay | Admitting: Internal Medicine

## 2011-10-04 DIAGNOSIS — E039 Hypothyroidism, unspecified: Secondary | ICD-10-CM

## 2011-10-04 DIAGNOSIS — R0989 Other specified symptoms and signs involving the circulatory and respiratory systems: Secondary | ICD-10-CM

## 2011-10-04 MED ORDER — LEVOTHYROXINE SODIUM 25 MCG PO TABS: 25.0000 ug | ORAL_TABLET | Freq: Every day | ORAL | Status: DC

## 2011-10-04 NOTE — Telephone Encounter (Signed)
Rx refill sent to pharmacy. 

## 2011-10-04 NOTE — Telephone Encounter (Signed)
Refill- levothroid tab. Take one tablet by mouth every day. Qty 30 last fill 4.25.13

## 2011-10-12 ENCOUNTER — Encounter (INDEPENDENT_AMBULATORY_CARE_PROVIDER_SITE_OTHER): Payer: Medicare Other

## 2011-10-12 DIAGNOSIS — R0989 Other specified symptoms and signs involving the circulatory and respiratory systems: Secondary | ICD-10-CM

## 2011-10-12 DIAGNOSIS — I6529 Occlusion and stenosis of unspecified carotid artery: Secondary | ICD-10-CM

## 2011-10-13 DIAGNOSIS — R0989 Other specified symptoms and signs involving the circulatory and respiratory systems: Secondary | ICD-10-CM | POA: Insufficient documentation

## 2011-10-13 NOTE — Assessment & Plan Note (Signed)
Neurologically asx. Schedule carotid US.

## 2011-10-13 NOTE — Assessment & Plan Note (Signed)
Normotensive and stable. Continue current regimen. Monitor bp as outpt and followup in clinic as scheduled.  

## 2011-10-24 ENCOUNTER — Ambulatory Visit (INDEPENDENT_AMBULATORY_CARE_PROVIDER_SITE_OTHER): Payer: Medicare Other | Admitting: *Deleted

## 2011-10-24 DIAGNOSIS — I4891 Unspecified atrial fibrillation: Secondary | ICD-10-CM

## 2011-10-24 DIAGNOSIS — I6350 Cerebral infarction due to unspecified occlusion or stenosis of unspecified cerebral artery: Secondary | ICD-10-CM | POA: Diagnosis not present

## 2011-10-24 DIAGNOSIS — I238 Other current complications following acute myocardial infarction: Secondary | ICD-10-CM | POA: Diagnosis not present

## 2011-10-24 LAB — POCT INR: INR: 4.2

## 2011-10-30 ENCOUNTER — Telehealth: Payer: Self-pay | Admitting: Internal Medicine

## 2011-10-30 ENCOUNTER — Other Ambulatory Visit: Payer: Self-pay | Admitting: Cardiology

## 2011-10-30 DIAGNOSIS — I10 Essential (primary) hypertension: Secondary | ICD-10-CM

## 2011-10-30 MED ORDER — TORSEMIDE 20 MG PO TABS: 30.0000 mg | ORAL_TABLET | Freq: Two times a day (BID) | ORAL | Status: DC

## 2011-10-30 NOTE — Telephone Encounter (Signed)
Rx refill sent to pharmacy. 

## 2011-11-08 ENCOUNTER — Ambulatory Visit (INDEPENDENT_AMBULATORY_CARE_PROVIDER_SITE_OTHER): Payer: Medicare Other | Admitting: *Deleted

## 2011-11-08 DIAGNOSIS — I238 Other current complications following acute myocardial infarction: Secondary | ICD-10-CM

## 2011-11-08 DIAGNOSIS — I6350 Cerebral infarction due to unspecified occlusion or stenosis of unspecified cerebral artery: Secondary | ICD-10-CM

## 2011-11-08 DIAGNOSIS — I4891 Unspecified atrial fibrillation: Secondary | ICD-10-CM | POA: Diagnosis not present

## 2011-11-12 ENCOUNTER — Telehealth: Payer: Self-pay | Admitting: Internal Medicine

## 2011-11-12 MED ORDER — CITALOPRAM HYDROBROMIDE 20 MG PO TABS: 20.0000 mg | ORAL_TABLET | Freq: Every day | ORAL | Status: DC

## 2011-11-12 NOTE — Telephone Encounter (Signed)
Refill- citalopram 20mg  tab. Take one tablet by mouth every day. Qty 30 last fill 6.6.13

## 2011-11-12 NOTE — Telephone Encounter (Signed)
Done/SLS 

## 2011-11-12 NOTE — Telephone Encounter (Signed)
#  30  rf 6 

## 2011-11-20 ENCOUNTER — Ambulatory Visit (INDEPENDENT_AMBULATORY_CARE_PROVIDER_SITE_OTHER): Payer: Medicare Other | Admitting: Internal Medicine

## 2011-11-20 VITALS — BP 130/62 | HR 68 | Temp 98.1°F | Wt 270.0 lb

## 2011-11-20 DIAGNOSIS — R0600 Dyspnea, unspecified: Secondary | ICD-10-CM

## 2011-11-20 DIAGNOSIS — R0609 Other forms of dyspnea: Secondary | ICD-10-CM | POA: Diagnosis not present

## 2011-11-20 DIAGNOSIS — I509 Heart failure, unspecified: Secondary | ICD-10-CM

## 2011-11-20 DIAGNOSIS — E8770 Fluid overload, unspecified: Secondary | ICD-10-CM

## 2011-11-20 DIAGNOSIS — R05 Cough: Secondary | ICD-10-CM | POA: Diagnosis not present

## 2011-11-20 DIAGNOSIS — E8779 Other fluid overload: Secondary | ICD-10-CM

## 2011-11-20 DIAGNOSIS — R0989 Other specified symptoms and signs involving the circulatory and respiratory systems: Secondary | ICD-10-CM | POA: Diagnosis not present

## 2011-11-20 MED ORDER — DOXYCYCLINE HYCLATE 100 MG PO TABS: 100.0000 mg | ORAL_TABLET | Freq: Two times a day (BID) | ORAL | Status: AC

## 2011-11-20 MED ORDER — BENZONATATE 100 MG PO CAPS: 100.0000 mg | ORAL_CAPSULE | Freq: Three times a day (TID) | ORAL | Status: AC | PRN

## 2011-11-21 ENCOUNTER — Telehealth: Payer: Self-pay | Admitting: *Deleted

## 2011-11-21 LAB — BRAIN NATRIURETIC PEPTIDE: Brain Natriuretic Peptide: 137.2 pg/mL — ABNORMAL HIGH (ref 0.0–100.0)

## 2011-11-21 NOTE — Telephone Encounter (Signed)
Pt called to inform us that she is on Tessalon PO tabs for cough and Doxycycline 100mg s BID for 7 days. Appt rescheduled to recheck INR.

## 2011-11-23 ENCOUNTER — Telehealth: Payer: Self-pay | Admitting: Internal Medicine

## 2011-11-23 MED ORDER — AMLODIPINE BESYLATE 5 MG PO TABS: 5.0000 mg | ORAL_TABLET | Freq: Every day | ORAL | Status: DC

## 2011-11-23 NOTE — Telephone Encounter (Signed)
Refill Rx to [facility]. amlodipine

## 2011-11-23 NOTE — Telephone Encounter (Signed)
Refill-amlodipine 5mg  tab. Take one tablet by mouth every day. Qty 30 last fill 5.23.13

## 2011-11-25 ENCOUNTER — Encounter: Payer: Self-pay | Admitting: Internal Medicine

## 2011-11-25 DIAGNOSIS — R05 Cough: Secondary | ICD-10-CM | POA: Insufficient documentation

## 2011-11-25 NOTE — Assessment & Plan Note (Signed)
Obtain BNP 

## 2011-11-25 NOTE — Progress Notes (Signed)
  Subjective:    [name], female    DOB: [birth year], 71 y.o.   MRN: [id number]  HPI Pt presents to clinic for evaluation of cough. Notes thick white sputum without color or blood. No associated wheezing or dyspnea. Has chronic le edema unchanged. No f/c. Takes coumadin chronically under the direct of the coumadin clinic. No other alleviating or exacerbating factors.   Past Medical History  Diagnosis Date  . ICD (implantable cardiac defibrillator) in place   . LBBB (left bundle branch block)   . Carotid bruit   . Dyslipidemia   . Hypertension   . CHF (congestive heart failure)     Diastolic heart failure but with history of variable ejection fractions most recent ejection fraction normal mild LVH June 2012 status post recent hospitalization in June for heart failure with volume overload  . Stroke   . Back pain, chronic   . Sleep apnea   . Cardiomyopathy, ischemic     Status post coronary bypass grafting no recurrent chest pain  . Cough   . History of recurrent UTIs   . Cystitis, acute   . Rhinosinusitis   . Dysuria   . Thyroid disease     hypothyroidism  . 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 CRI (chronic renal insufficiency)   . 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 DDD (degenerative disc disease)     multilevel  . Lymphoma, non Hodgkin's     In remission  . Biventricular implantable cardiac defibrillator in situ     Status post device revision secondary to end of life  . Hypoxemia requiring supplemental oxygen   . Candidiasis of vulva and vagina    Past Surgical History  Procedure Date  . Abdominal hysterectomy 1982  . Cholecystectomy 1970  . Coronary artery bypass graft 2003  . Tubal ligation   . Cardiac catheterization 06/03/01    reports that she has never smoked. She has never used smokeless tobacco. She reports that she does not drink alcohol or use illicit drugs. family history  includes Asthma in her mother; Cancer in her mother; and Heart disease in her father. Allergies  Allergen Reactions  . Sulfonamide Derivatives      Review of Systems see hpi     Objective:   Physical Exam  Nursing note and vitals reviewed. Constitutional: She appears well-developed and well-nourished. No distress.  HENT:  Head: Normocephalic and atraumatic.  Right Ear: External ear normal.  Left Ear: External ear normal.  Nose: Nose normal.  Mouth/Throat: Oropharynx is clear and moist. No oropharyngeal exudate.  Eyes: Conjunctivae are normal. No scleral icterus.  Neck: Neck supple.  Cardiovascular: Normal rate, regular rhythm and normal heart sounds.  Exam reveals no gallop.   Pulmonary/Chest: Effort normal and breath sounds normal. No respiratory distress. She has no wheezes. She has no rales.  Lymphadenopathy:    She has no cervical adenopathy.  Neurological: She is alert.  Skin: Skin is warm and dry. She is not diaphoretic.  Psychiatric: She has a normal mood and affect.  Ext: +1 bilateral LE edema       Assessment & Plan:

## 2011-11-25 NOTE — Assessment & Plan Note (Signed)
Begin abx. Notify coumadin clinic. Attempt tessalon prn. Followup if no improvement or worsening.

## 2011-11-26 ENCOUNTER — Ambulatory Visit (INDEPENDENT_AMBULATORY_CARE_PROVIDER_SITE_OTHER): Payer: Medicare Other

## 2011-11-26 DIAGNOSIS — I238 Other current complications following acute myocardial infarction: Secondary | ICD-10-CM | POA: Diagnosis not present

## 2011-11-26 DIAGNOSIS — I4891 Unspecified atrial fibrillation: Secondary | ICD-10-CM

## 2011-11-26 DIAGNOSIS — I6350 Cerebral infarction due to unspecified occlusion or stenosis of unspecified cerebral artery: Secondary | ICD-10-CM | POA: Diagnosis not present

## 2011-12-07 DIAGNOSIS — H2510 Age-related nuclear cataract, unspecified eye: Secondary | ICD-10-CM | POA: Diagnosis not present

## 2011-12-07 DIAGNOSIS — E1139 Type 2 diabetes mellitus with other diabetic ophthalmic complication: Secondary | ICD-10-CM | POA: Diagnosis not present

## 2011-12-07 DIAGNOSIS — H52209 Unspecified astigmatism, unspecified eye: Secondary | ICD-10-CM | POA: Diagnosis not present

## 2011-12-07 DIAGNOSIS — H25049 Posterior subcapsular polar age-related cataract, unspecified eye: Secondary | ICD-10-CM | POA: Diagnosis not present

## 2011-12-12 ENCOUNTER — Ambulatory Visit (HOSPITAL_BASED_OUTPATIENT_CLINIC_OR_DEPARTMENT_OTHER)
Admission: RE | Admit: 2011-12-12 | Discharge: 2011-12-12 | Disposition: A | Discharge status: Home / Self Care | Payer: Medicare Other | Source: Ambulatory Visit | Attending: Family | Admitting: Family

## 2011-12-12 ENCOUNTER — Encounter: Payer: Self-pay | Admitting: Family

## 2011-12-12 ENCOUNTER — Ambulatory Visit (INDEPENDENT_AMBULATORY_CARE_PROVIDER_SITE_OTHER): Payer: Medicare Other | Admitting: Family

## 2011-12-12 ENCOUNTER — Telehealth: Payer: Self-pay | Admitting: Family

## 2011-12-12 VITALS — BP 130/62 | HR 60 | Temp 98.0°F | Resp 20 | Ht 64.0 in | Wt 271.0 lb

## 2011-12-12 DIAGNOSIS — M7730 Calcaneal spur, unspecified foot: Secondary | ICD-10-CM | POA: Diagnosis not present

## 2011-12-12 DIAGNOSIS — M25579 Pain in unspecified ankle and joints of unspecified foot: Secondary | ICD-10-CM | POA: Diagnosis not present

## 2011-12-12 DIAGNOSIS — M79671 Pain in right foot: Secondary | ICD-10-CM | POA: Insufficient documentation

## 2011-12-12 DIAGNOSIS — M79609 Pain in unspecified limb: Secondary | ICD-10-CM

## 2011-12-12 MED ORDER — METHYLPREDNISOLONE 4 MG PO KIT: PACK | ORAL | Status: AC

## 2011-12-12 NOTE — Patient Instructions (Addendum)
Please complete your blood work prior to leaving. Complete you x-rays on the first floor.

## 2011-12-12 NOTE — Assessment & Plan Note (Signed)
Secondary to gout vs injury.  Obtain x-ray right foot/ankle.  Obtain uric acid level.  If gout, plan rx with short course of steroids.

## 2011-12-12 NOTE — Progress Notes (Signed)
Subjective:    [name], female    DOB: [birth year], 70 y.o.   MRN: [id number]  HPI  Ms.  [name] is a 71 yr old female who presents today with chief complaint of right foot swelling and pain. Swelling has been present x 2.5  Started after she stepped "wrong" on her right foot. Reports + soreness right  lateral ankle.  Has associated "popping noise" of the right great toe when she walks.  She reports that she has used allopurinol, and colcrys once daily x 5 days without improvement.     Review of Systems See HPI  Past Medical History  Diagnosis Date  . ICD (implantable cardiac defibrillator) in place   . LBBB (left bundle branch block)   . Carotid bruit   . Dyslipidemia   . Hypertension   . CHF (congestive heart failure)     Diastolic heart failure but with history of variable ejection fractions most recent ejection fraction normal mild LVH June 2012 status post recent hospitalization in June for heart failure with volume overload  . Stroke   . Back pain, chronic   . Sleep apnea   . Cardiomyopathy, ischemic     Status post coronary bypass grafting no recurrent chest pain  . Cough   . History of recurrent UTIs   . Cystitis, acute   . Rhinosinusitis   . Dysuria   . Thyroid disease     hypothyroidism  . 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 CRI (chronic renal insufficiency)   . 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 DDD (degenerative disc disease)     multilevel  . Lymphoma, non Hodgkin's     In remission  . Biventricular implantable cardiac defibrillator in situ     Status post device revision secondary to end of life  . Hypoxemia requiring supplemental oxygen   . Candidiasis of vulva and vagina     History   Social History  . Marital Status: Married    Spouse Name: N/A    Number of Children: Y  . Years of Education: N/A   Occupational History  . 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     Social History Main Topics  . Smoking status: Never Smoker   . Smokeless tobacco: Never Used  . Alcohol Use: No  . Drug Use: No  . Sexually Active: Not on file   Other Topics Concern  . Not on file   Social History Narrative   Regular Exercise:  No    Past Surgical History  Procedure Date  . Abdominal hysterectomy 1982  . Cholecystectomy 1970  . Coronary artery bypass graft 2003  . Tubal ligation   . Cardiac catheterization 06/03/01    Family History  Problem Relation Age of Onset  . Cancer Mother     kidney and female repo  . Heart disease Father   . Asthma Mother     Allergies  Allergen Reactions  . Sulfonamide Derivatives     Current Outpatient Prescriptions on File Prior to Visit  Medication Sig Dispense Refill  . allopurinol (ZYLOPRIM) 100 MG tablet Take 1 tablet (100 mg total) by mouth daily.  30 tablet  6  . amLODipine (NORVASC) 5 MG tablet Take 1 tablet (5 mg total) by mouth daily.  30 tablet  3  . aspirin 81 MG tablet Take 81 mg  by mouth daily.        . Calcium 600-200 MG-UNIT per tablet Take 1 tablet by mouth daily.        . carvedilol (COREG) 25 MG tablet Take 2 tablets by mouth twice a day  120 tablet  3  . Cholecalciferol (VITAMIN D) 1000 UNITS capsule Take 1,000 Units by mouth daily.        . Choline Fenofibrate (TRILIPIX) 135 MG capsule Take 1 capsule (135 mg total) by mouth daily.  30 capsule  6  . citalopram (CELEXA) 20 MG tablet Take 1 tablet (20 mg total) by mouth daily.  30 tablet  6  . colchicine 0.6 MG tablet Take 0.6 mg by mouth as needed.      . docusate sodium (COLACE) 100 MG capsule Take 100 mg by mouth 2 (two) times daily as needed.       . enalapril (VASOTEC) 10 MG tablet Take 1 tablet (10 mg total) by mouth daily.  90 tablet  1  . gabapentin (NEURONTIN) 100 MG capsule Take 1 capsule (100 mg total) by mouth 2 (two) times daily.  60 capsule  3  . insulin NPH (HUMULIN N,NOVOLIN N) 100 UNIT/ML injection 50 U every morning and 40 U at night       . insulin regular (NOVOLIN R,HUMULIN R) 100 units/mL injection Inject into the skin. 30 units in am, 20 units pm      . Insulin Syringe-Needle U-100 (B-D INS SYR ULTRAFINE 1CC/30G) 30G X 1/2" 1 ML MISC Use for insulin injection five times a day (250.02)  200 each  6  . levothyroxine (SYNTHROID, LEVOTHROID) 25 MCG tablet Take 1 tablet (25 mcg total) by mouth daily.  90 tablet  1  . LORazepam (ATIVAN) 0.5 MG tablet Take 1 tablet (0.5 mg total) by mouth at bedtime.  90 tablet  1  . Multiple Vitamins-Minerals (MULTIVITAMIN WITH MINERALS) tablet Take 1 tablet by mouth daily.        . Omega-3 Fatty Acids (FISH OIL) 1200 MG CAPS Take 2 capsules by mouth 2 (two) times daily.        . rosuvastatin (CRESTOR) 10 MG tablet Take 1 tablet (10 mg total) by mouth daily.  30 tablet  6  . torsemide (DEMADEX) 20 MG tablet Take 1.5 tablets (30 mg total) by mouth 2 (two) times daily.  90 tablet  3  . warfarin (COUMADIN) 5 MG tablet Take as directed by anticoagulation clinic  120 tablet  1    BP 130/62  Pulse 60  Temp 98 F (36.7 C) (Oral)  Resp 20  Ht 5\' 4"  (1.626 m)  Wt 271 lb (122.925 kg)  BMI 46.52 kg/m2  SpO2 93%       Objective:   Physical Exam  Constitutional: She is oriented to person, place, and time. She appears well-developed and well-nourished. No distress.  HENT:  Head: Normocephalic and atraumatic.  Cardiovascular: Normal rate and regular rhythm.   No murmur heard. Pulmonary/Chest: Effort normal and breath sounds normal. No respiratory distress. She has no wheezes. She has no rales. She exhibits no tenderness.  Musculoskeletal:       R foot swelling is noted. No erythema.    Neurological: She is alert and oriented to person, place, and time.  Skin: Skin is warm and dry.  Psychiatric: She has a normal mood and affect. Her behavior is normal. Judgment and thought content normal.          Assessment & Plan:

## 2011-12-12 NOTE — Telephone Encounter (Signed)
Please call pt and let her know that her x-ray is negative for fracture.  Uric acid level is still pending, but she can start a Medrol Dose pak for likely gout flare. Call if symptoms worsen or if no improvement in 2-3 days.

## 2011-12-13 ENCOUNTER — Telehealth: Payer: Self-pay | Admitting: Internal Medicine

## 2011-12-13 MED ORDER — ALLOPURINOL 100 MG PO TABS: 100.0000 mg | ORAL_TABLET | Freq: Two times a day (BID) | ORAL | Status: DC

## 2011-12-13 NOTE — Telephone Encounter (Signed)
Pt notified of results below. Pt requested gout diet and it will be mailed.  She requests refill of allopurinol; refill sent. Pt states that Colcrys has caused her to have diarrhea "pretty bad". Advised pt not to increase dose at this time. Please advise.

## 2011-12-13 NOTE — Telephone Encounter (Signed)
Uric acid is mildly elevated. Foot pain is likely gout related. Would rec that she increase allopurinol to bid . Can increase colcrys to bid next few days until  symptoms improve. Call if no improvement with these changes.

## 2011-12-13 NOTE — Telephone Encounter (Signed)
Patient would like to know if she has gout and if so will Melissa call in the rx that will help.  Melissa also told her she would give her a list of foods to avoid to keep from getting gout.

## 2011-12-14 NOTE — Telephone Encounter (Signed)
Advised pt of negative foot xray and to continue Colcrys once daily and hold off on medrol dose pack. Will use dose pack if colcrys and allopurinol do not get symptoms under control. Pt voices understanding.

## 2011-12-14 NOTE — Telephone Encounter (Signed)
See 12/13/11 phone note.

## 2011-12-14 NOTE — Telephone Encounter (Signed)
Continue colrys once daily as tolerated until symptoms improved.

## 2011-12-18 ENCOUNTER — Other Ambulatory Visit (INDEPENDENT_AMBULATORY_CARE_PROVIDER_SITE_OTHER): Payer: Medicare Other

## 2011-12-18 DIAGNOSIS — N289 Disorder of kidney and ureter, unspecified: Secondary | ICD-10-CM | POA: Diagnosis not present

## 2011-12-18 DIAGNOSIS — D649 Anemia, unspecified: Secondary | ICD-10-CM | POA: Diagnosis not present

## 2011-12-18 LAB — CBC
MCV: 92.4 fl (ref 78.0–100.0)
Platelets: 270 10*3/uL (ref 150.0–400.0)
RBC: 4.24 Mil/uL (ref 3.87–5.11)

## 2011-12-18 LAB — BASIC METABOLIC PANEL
CO2: 27 mEq/L (ref 19–32)
Calcium: 9.5 mg/dL (ref 8.4–10.5)
Chloride: 99 mEq/L (ref 96–112)
Potassium: 5.2 mEq/L — ABNORMAL HIGH (ref 3.5–5.1)
Sodium: 137 mEq/L (ref 135–145)

## 2011-12-26 ENCOUNTER — Ambulatory Visit (INDEPENDENT_AMBULATORY_CARE_PROVIDER_SITE_OTHER): Payer: Medicare Other | Admitting: Internal Medicine

## 2011-12-26 ENCOUNTER — Ambulatory Visit (HOSPITAL_BASED_OUTPATIENT_CLINIC_OR_DEPARTMENT_OTHER)
Admission: RE | Admit: 2011-12-26 | Discharge: 2011-12-26 | Disposition: A | Discharge status: Home / Self Care | Payer: Medicare Other | Source: Ambulatory Visit | Attending: Internal Medicine | Admitting: Internal Medicine

## 2011-12-26 ENCOUNTER — Encounter: Payer: Self-pay | Admitting: Internal Medicine

## 2011-12-26 ENCOUNTER — Ambulatory Visit (INDEPENDENT_AMBULATORY_CARE_PROVIDER_SITE_OTHER): Payer: Medicare Other | Admitting: *Deleted

## 2011-12-26 VITALS — BP 112/78 | HR 60 | Temp 98.1°F | Resp 16 | Ht 64.0 in | Wt 272.2 lb

## 2011-12-26 DIAGNOSIS — N289 Disorder of kidney and ureter, unspecified: Secondary | ICD-10-CM | POA: Diagnosis not present

## 2011-12-26 DIAGNOSIS — M79609 Pain in unspecified limb: Secondary | ICD-10-CM | POA: Insufficient documentation

## 2011-12-26 DIAGNOSIS — I238 Other current complications following acute myocardial infarction: Secondary | ICD-10-CM

## 2011-12-26 DIAGNOSIS — I6350 Cerebral infarction due to unspecified occlusion or stenosis of unspecified cerebral artery: Secondary | ICD-10-CM | POA: Diagnosis not present

## 2011-12-26 DIAGNOSIS — M7730 Calcaneal spur, unspecified foot: Secondary | ICD-10-CM | POA: Insufficient documentation

## 2011-12-26 DIAGNOSIS — IMO0001 Reserved for inherently not codable concepts without codable children: Secondary | ICD-10-CM | POA: Diagnosis not present

## 2011-12-26 DIAGNOSIS — M79671 Pain in right foot: Secondary | ICD-10-CM

## 2011-12-26 DIAGNOSIS — I4891 Unspecified atrial fibrillation: Secondary | ICD-10-CM

## 2011-12-26 DIAGNOSIS — M791 Myalgia, unspecified site: Secondary | ICD-10-CM

## 2011-12-26 DIAGNOSIS — I10 Essential (primary) hypertension: Secondary | ICD-10-CM | POA: Diagnosis not present

## 2011-12-26 LAB — BASIC METABOLIC PANEL
CO2: 26 mEq/L (ref 19–32)
Chloride: 105 mEq/L (ref 96–112)
Creat: 2.23 mg/dL — ABNORMAL HIGH (ref 0.50–1.10)

## 2011-12-26 LAB — POCT INR: INR: 5.6

## 2011-12-26 MED ORDER — CARVEDILOL 25 MG PO TABS: ORAL_TABLET | ORAL | Status: DC

## 2011-12-26 MED ORDER — GABAPENTIN 100 MG PO CAPS: 100.0000 mg | ORAL_CAPSULE | Freq: Two times a day (BID) | ORAL | Status: DC

## 2011-12-26 MED ORDER — ENALAPRIL MALEATE 10 MG PO TABS: 10.0000 mg | ORAL_TABLET | Freq: Every day | ORAL | Status: DC

## 2011-12-26 NOTE — Assessment & Plan Note (Signed)
Acute on chronic. Now cr 2.0 with recent range 1.6-1.9. Repeat chem7 and avoid nephrotoxic agents. Pt feels mildly volume deplete. Decrease torsemide dose mildly from 1.5 tablet to 1.

## 2011-12-26 NOTE — Assessment & Plan Note (Signed)
No definitive response to gout treatment. Stop colchicine and decrease allopurinol dose with rising creatinine. Avoid nsaids as well. Repeat plain radiograph of foot. If reimaging neg then proceed with orthopedic consult. Doubt inflammatory etiology currently given hx, exam and lack of response to tx. Elevated uric acid level can be present with renal insufficiency. Concern for mechanical etiology given very acute onset.

## 2011-12-26 NOTE — Progress Notes (Signed)
  Subjective:    [name], female    DOB: Mar 25, 1941, 71 y.o.   MRN: [id number]  HPI Pt presents to clinic for followup of multiple medical problems. Notes ~2+week h/o right foot pain. Occurred acutely while walking but without injury/trauma. Has associated ST swelling and pain worse with direct pressure to plantar 1-2 MT head area. Underwent plain xray without evidence of fracture. Uric acid elevated and was tx'ed empirically for gout. Took colchicine for 6d without improvement of swelling or pain and developed diarrhea. States feel like she may be mildly dry and attributes leg myalgias to this. Allopurinol was increased at the time as well. Reviewed chem7 with Cr increased from 1.6-17 to 2.0 with minimal hyperkalemia.   Past Medical History  Diagnosis Date  . ICD (implantable cardiac defibrillator) in place   . LBBB (left bundle branch block)   . Carotid bruit   . Dyslipidemia   . Hypertension   . CHF (congestive heart failure)     Diastolic heart failure but with history of variable ejection fractions most recent ejection fraction normal mild LVH June 2012 status post recent hospitalization in June for heart failure with volume overload  . Stroke   . Back pain, chronic   . Sleep apnea   . Cardiomyopathy, ischemic     Status post coronary bypass grafting no recurrent chest pain  . Cough   . History of recurrent UTIs   . Cystitis, acute   . Rhinosinusitis   . Dysuria   . Thyroid disease     hypothyroidism  . 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 CRI (chronic renal insufficiency)   . 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 DDD (degenerative disc disease)     multilevel  . Lymphoma, non Hodgkin's     In remission  . Biventricular implantable cardiac defibrillator in situ     Status post device revision secondary to end of life  . Hypoxemia requiring supplemental oxygen   . Candidiasis of vulva and  vagina    Past Surgical History  Procedure Date  . Abdominal hysterectomy 1982  . Cholecystectomy 1970  . Coronary artery bypass graft 2003  . Tubal ligation   . Cardiac catheterization 06/03/01    reports that she has never smoked. She has never used smokeless tobacco. She reports that she does not drink alcohol or use illicit drugs. family history includes Asthma in her mother; Cancer in her mother; and Heart disease in her father. Allergies  Allergen Reactions  . Sulfonamide Derivatives       Review of Systems see hpi     Objective:   Physical Exam  Nursing note and vitals reviewed. Constitutional: She appears well-developed and well-nourished. No distress.  HENT:  Head: Normocephalic and atraumatic.  Musculoskeletal:       Right foot-mild ST swelling. No erythema or warmth. No wound. NT to palpation  Neurological: She is alert.  Skin: Skin is warm and dry. She is not diaphoretic.  Psychiatric: She has a normal mood and affect.          Assessment & Plan:

## 2011-12-26 NOTE — Assessment & Plan Note (Signed)
Obtain CK enzyme.

## 2011-12-27 ENCOUNTER — Encounter (HOSPITAL_COMMUNITY): Payer: Self-pay | Admitting: Emergency Medicine

## 2011-12-27 ENCOUNTER — Emergency Department (HOSPITAL_COMMUNITY): Payer: Medicare Other

## 2011-12-27 ENCOUNTER — Inpatient Hospital Stay (HOSPITAL_COMMUNITY)
Admission: EM | Admit: 2011-12-27 | Discharge: 2011-12-28 | DRG: 683 | Disposition: A | Discharge status: Home / Self Care | Payer: Medicare Other | Attending: Internal Medicine | Admitting: Internal Medicine

## 2011-12-27 ENCOUNTER — Ambulatory Visit (INDEPENDENT_AMBULATORY_CARE_PROVIDER_SITE_OTHER): Payer: Medicare Other | Admitting: *Deleted

## 2011-12-27 ENCOUNTER — Encounter: Payer: Self-pay | Admitting: Internal Medicine

## 2011-12-27 DIAGNOSIS — R0902 Hypoxemia: Secondary | ICD-10-CM

## 2011-12-27 DIAGNOSIS — I509 Heart failure, unspecified: Secondary | ICD-10-CM | POA: Diagnosis not present

## 2011-12-27 DIAGNOSIS — M549 Dorsalgia, unspecified: Secondary | ICD-10-CM

## 2011-12-27 DIAGNOSIS — G4733 Obstructive sleep apnea (adult) (pediatric): Secondary | ICD-10-CM | POA: Diagnosis present

## 2011-12-27 DIAGNOSIS — I5031 Acute diastolic (congestive) heart failure: Secondary | ICD-10-CM | POA: Diagnosis present

## 2011-12-27 DIAGNOSIS — R05 Cough: Secondary | ICD-10-CM

## 2011-12-27 DIAGNOSIS — I255 Ischemic cardiomyopathy: Secondary | ICD-10-CM

## 2011-12-27 DIAGNOSIS — M79609 Pain in unspecified limb: Secondary | ICD-10-CM

## 2011-12-27 DIAGNOSIS — E039 Hypothyroidism, unspecified: Secondary | ICD-10-CM | POA: Diagnosis not present

## 2011-12-27 DIAGNOSIS — I517 Cardiomegaly: Secondary | ICD-10-CM | POA: Diagnosis not present

## 2011-12-27 DIAGNOSIS — G629 Polyneuropathy, unspecified: Secondary | ICD-10-CM

## 2011-12-27 DIAGNOSIS — R791 Abnormal coagulation profile: Secondary | ICD-10-CM | POA: Diagnosis present

## 2011-12-27 DIAGNOSIS — I4891 Unspecified atrial fibrillation: Secondary | ICD-10-CM

## 2011-12-27 DIAGNOSIS — E11319 Type 2 diabetes mellitus with unspecified diabetic retinopathy without macular edema: Secondary | ICD-10-CM | POA: Diagnosis present

## 2011-12-27 DIAGNOSIS — I5042 Chronic combined systolic (congestive) and diastolic (congestive) heart failure: Secondary | ICD-10-CM

## 2011-12-27 DIAGNOSIS — G909 Disorder of the autonomic nervous system, unspecified: Secondary | ICD-10-CM | POA: Diagnosis present

## 2011-12-27 DIAGNOSIS — I129 Hypertensive chronic kidney disease with stage 1 through stage 4 chronic kidney disease, or unspecified chronic kidney disease: Secondary | ICD-10-CM | POA: Diagnosis present

## 2011-12-27 DIAGNOSIS — N390 Urinary tract infection, site not specified: Secondary | ICD-10-CM

## 2011-12-27 DIAGNOSIS — I238 Other current complications following acute myocardial infarction: Secondary | ICD-10-CM | POA: Diagnosis present

## 2011-12-27 DIAGNOSIS — M6282 Rhabdomyolysis: Secondary | ICD-10-CM | POA: Diagnosis present

## 2011-12-27 DIAGNOSIS — IMO0001 Reserved for inherently not codable concepts without codable children: Secondary | ICD-10-CM

## 2011-12-27 DIAGNOSIS — I10 Essential (primary) hypertension: Secondary | ICD-10-CM | POA: Diagnosis not present

## 2011-12-27 DIAGNOSIS — Z7901 Long term (current) use of anticoagulants: Secondary | ICD-10-CM | POA: Diagnosis not present

## 2011-12-27 DIAGNOSIS — I480 Paroxysmal atrial fibrillation: Secondary | ICD-10-CM | POA: Diagnosis present

## 2011-12-27 DIAGNOSIS — I6350 Cerebral infarction due to unspecified occlusion or stenosis of unspecified cerebral artery: Secondary | ICD-10-CM

## 2011-12-27 DIAGNOSIS — I2589 Other forms of chronic ischemic heart disease: Secondary | ICD-10-CM | POA: Diagnosis present

## 2011-12-27 DIAGNOSIS — E1139 Type 2 diabetes mellitus with other diabetic ophthalmic complication: Secondary | ICD-10-CM | POA: Diagnosis present

## 2011-12-27 DIAGNOSIS — C8589 Other specified types of non-Hodgkin lymphoma, extranodal and solid organ sites: Secondary | ICD-10-CM

## 2011-12-27 DIAGNOSIS — E860 Dehydration: Secondary | ICD-10-CM | POA: Diagnosis present

## 2011-12-27 DIAGNOSIS — T466X5A Adverse effect of antihyperlipidemic and antiarteriosclerotic drugs, initial encounter: Secondary | ICD-10-CM | POA: Diagnosis present

## 2011-12-27 DIAGNOSIS — Z794 Long term (current) use of insulin: Secondary | ICD-10-CM | POA: Diagnosis not present

## 2011-12-27 DIAGNOSIS — E669 Obesity, unspecified: Secondary | ICD-10-CM | POA: Diagnosis present

## 2011-12-27 DIAGNOSIS — N183 Chronic kidney disease, stage 3 unspecified: Secondary | ICD-10-CM | POA: Diagnosis present

## 2011-12-27 DIAGNOSIS — R0602 Shortness of breath: Secondary | ICD-10-CM | POA: Diagnosis not present

## 2011-12-27 DIAGNOSIS — Z7982 Long term (current) use of aspirin: Secondary | ICD-10-CM | POA: Diagnosis not present

## 2011-12-27 DIAGNOSIS — Z6841 Body Mass Index (BMI) 40.0 and over, adult: Secondary | ICD-10-CM | POA: Diagnosis not present

## 2011-12-27 DIAGNOSIS — E1149 Type 2 diabetes mellitus with other diabetic neurological complication: Secondary | ICD-10-CM | POA: Diagnosis present

## 2011-12-27 DIAGNOSIS — I890 Lymphedema, not elsewhere classified: Secondary | ICD-10-CM | POA: Diagnosis present

## 2011-12-27 DIAGNOSIS — R0989 Other specified symptoms and signs involving the circulatory and respiratory systems: Secondary | ICD-10-CM

## 2011-12-27 DIAGNOSIS — M79671 Pain in right foot: Secondary | ICD-10-CM

## 2011-12-27 DIAGNOSIS — N179 Acute kidney failure, unspecified: Principal | ICD-10-CM | POA: Diagnosis present

## 2011-12-27 DIAGNOSIS — C8595 Non-Hodgkin lymphoma, unspecified, lymph nodes of inguinal region and lower limb: Secondary | ICD-10-CM | POA: Diagnosis present

## 2011-12-27 DIAGNOSIS — Z79899 Other long term (current) drug therapy: Secondary | ICD-10-CM | POA: Diagnosis not present

## 2011-12-27 DIAGNOSIS — Z9981 Dependence on supplemental oxygen: Secondary | ICD-10-CM

## 2011-12-27 DIAGNOSIS — E785 Hyperlipidemia, unspecified: Secondary | ICD-10-CM

## 2011-12-27 DIAGNOSIS — M109 Gout, unspecified: Secondary | ICD-10-CM | POA: Diagnosis present

## 2011-12-27 DIAGNOSIS — G4730 Sleep apnea, unspecified: Secondary | ICD-10-CM

## 2011-12-27 DIAGNOSIS — N289 Disorder of kidney and ureter, unspecified: Secondary | ICD-10-CM

## 2011-12-27 DIAGNOSIS — I447 Left bundle-branch block, unspecified: Secondary | ICD-10-CM

## 2011-12-27 DIAGNOSIS — M791 Myalgia, unspecified site: Secondary | ICD-10-CM

## 2011-12-27 DIAGNOSIS — Z95810 Presence of automatic (implantable) cardiac defibrillator: Secondary | ICD-10-CM | POA: Diagnosis present

## 2011-12-27 DIAGNOSIS — R059 Cough, unspecified: Secondary | ICD-10-CM

## 2011-12-27 DIAGNOSIS — R0600 Dyspnea, unspecified: Secondary | ICD-10-CM

## 2011-12-27 DIAGNOSIS — C8590 Non-Hodgkin lymphoma, unspecified, unspecified site: Secondary | ICD-10-CM | POA: Diagnosis present

## 2011-12-27 HISTORY — DX: Obstructive sleep apnea (adult) (pediatric): G47.33

## 2011-12-27 HISTORY — DX: Hypothyroidism, unspecified: E03.9

## 2011-12-27 HISTORY — DX: Dependence on other enabling machines and devices: Z99.89

## 2011-12-27 HISTORY — DX: Non-Hodgkin lymphoma, unspecified, lymph nodes of inguinal region and lower limb: C85.95

## 2011-12-27 HISTORY — DX: Dependence on supplemental oxygen: Z99.81

## 2011-12-27 LAB — GLUCOSE, CAPILLARY: Glucose-Capillary: 74 mg/dL (ref 70–99)

## 2011-12-27 LAB — COMPREHENSIVE METABOLIC PANEL
ALT: 81 U/L — ABNORMAL HIGH (ref 0–35)
AST: 84 U/L — ABNORMAL HIGH (ref 0–37)
Albumin: 3.5 g/dL (ref 3.5–5.2)
Alkaline Phosphatase: 49 U/L (ref 39–117)
Chloride: 102 mEq/L (ref 96–112)
Potassium: 4.8 mEq/L (ref 3.5–5.1)
Sodium: 138 mEq/L (ref 135–145)
Total Bilirubin: 0.3 mg/dL (ref 0.3–1.2)
Total Protein: 6.7 g/dL (ref 6.0–8.3)

## 2011-12-27 LAB — CBC
Hemoglobin: 11.9 g/dL — ABNORMAL LOW (ref 12.0–15.0)
MCHC: 33.2 g/dL (ref 30.0–36.0)
RBC: 3.89 MIL/uL (ref 3.87–5.11)
RDW: 13.9 % (ref 11.5–15.5)

## 2011-12-27 LAB — CK TOTAL AND CKMB (NOT AT ARMC)
CK, MB: 19.1 ng/mL (ref 0.3–4.0)
Relative Index: 1 (ref 0.0–2.5)
Total CK: 1932 U/L — ABNORMAL HIGH (ref 7–177)

## 2011-12-27 LAB — POCT I-STAT TROPONIN I: Troponin i, poc: 0.03 ng/mL (ref 0.00–0.08)

## 2011-12-27 LAB — APTT: aPTT: 50 seconds — ABNORMAL HIGH (ref 24–37)

## 2011-12-27 MED ORDER — INSULIN ASPART 100 UNIT/ML ~~LOC~~ SOLN
20.0000 [IU] | Freq: Every day | SUBCUTANEOUS | Status: DC
  Administered 2011-12-27: 20 [IU] via SUBCUTANEOUS

## 2011-12-27 MED ORDER — VITAMIN D3 25 MCG (1000 UNIT) PO TABS
1000.0000 [IU] | ORAL_TABLET | Freq: Every day | ORAL | Status: DC
  Administered 2011-12-27 – 2011-12-28 (×2): 1000 [IU] via ORAL
  Filled 2011-12-27 (×2): qty 1

## 2011-12-27 MED ORDER — INSULIN ASPART 100 UNIT/ML ~~LOC~~ SOLN
30.0000 [IU] | Freq: Every day | SUBCUTANEOUS | Status: DC
  Administered 2011-12-28: 30 [IU] via SUBCUTANEOUS

## 2011-12-27 MED ORDER — OXYCODONE HCL 5 MG PO TABS: 5.0000 mg | ORAL_TABLET | ORAL | Status: DC | PRN

## 2011-12-27 MED ORDER — SODIUM CHLORIDE 0.9 % IJ SOLN
3.0000 mL | Freq: Two times a day (BID) | INTRAMUSCULAR | Status: DC
  Administered 2011-12-28: 3 mL via INTRAVENOUS

## 2011-12-27 MED ORDER — INSULIN NPH (HUMAN) (ISOPHANE) 100 UNIT/ML ~~LOC~~ SUSP: 40.0000 [IU] | Freq: Two times a day (BID) | SUBCUTANEOUS | Status: DC

## 2011-12-27 MED ORDER — ACETAMINOPHEN 650 MG RE SUPP: 650.0000 mg | Freq: Four times a day (QID) | RECTAL | Status: DC | PRN

## 2011-12-27 MED ORDER — LEVOTHYROXINE SODIUM 25 MCG PO TABS
25.0000 ug | ORAL_TABLET | Freq: Every day | ORAL | Status: DC
  Administered 2011-12-27 – 2011-12-28 (×2): 25 ug via ORAL
  Filled 2011-12-27 (×4): qty 1

## 2011-12-27 MED ORDER — LORAZEPAM 0.5 MG PO TABS
0.5000 mg | ORAL_TABLET | Freq: Every day | ORAL | Status: DC
  Administered 2011-12-27: 0.5 mg via ORAL
  Filled 2011-12-27: qty 1

## 2011-12-27 MED ORDER — INSULIN REGULAR HUMAN 100 UNIT/ML IJ SOLN: 20.0000 [IU] | Freq: Two times a day (BID) | INTRAMUSCULAR | Status: DC

## 2011-12-27 MED ORDER — CALCIUM 600-200 MG-UNIT PO TABS: 1.0000 | ORAL_TABLET | Freq: Every day | ORAL | Status: DC

## 2011-12-27 MED ORDER — CITALOPRAM HYDROBROMIDE 20 MG PO TABS
20.0000 mg | ORAL_TABLET | Freq: Every day | ORAL | Status: DC
  Administered 2011-12-27 – 2011-12-28 (×2): 20 mg via ORAL
  Filled 2011-12-27 (×2): qty 1

## 2011-12-27 MED ORDER — SODIUM CHLORIDE 0.9 % IV SOLN: INTRAVENOUS | Status: DC

## 2011-12-27 MED ORDER — GABAPENTIN 100 MG PO CAPS
100.0000 mg | ORAL_CAPSULE | Freq: Two times a day (BID) | ORAL | Status: DC
  Administered 2011-12-27 – 2011-12-28 (×2): 100 mg via ORAL
  Filled 2011-12-27 (×3): qty 1

## 2011-12-27 MED ORDER — ALLOPURINOL 100 MG PO TABS
100.0000 mg | ORAL_TABLET | Freq: Two times a day (BID) | ORAL | Status: DC
  Administered 2011-12-27 – 2011-12-28 (×2): 100 mg via ORAL
  Filled 2011-12-27 (×3): qty 1

## 2011-12-27 MED ORDER — SODIUM CHLORIDE 0.9 % IV SOLN
INTRAVENOUS | Status: DC
  Administered 2011-12-27 – 2011-12-28 (×2): via INTRAVENOUS

## 2011-12-27 MED ORDER — WARFARIN - PHARMACIST DOSING INPATIENT: Freq: Every day | Status: DC

## 2011-12-27 MED ORDER — ONDANSETRON HCL 4 MG/2ML IJ SOLN: 4.0000 mg | Freq: Four times a day (QID) | INTRAMUSCULAR | Status: DC | PRN

## 2011-12-27 MED ORDER — VITAMIN D 1000 UNITS PO CAPS: 1000.0000 [IU] | ORAL_CAPSULE | Freq: Every day | ORAL | Status: DC

## 2011-12-27 MED ORDER — ONDANSETRON HCL 4 MG PO TABS: 4.0000 mg | ORAL_TABLET | Freq: Four times a day (QID) | ORAL | Status: DC | PRN

## 2011-12-27 MED ORDER — AMLODIPINE BESYLATE 5 MG PO TABS
5.0000 mg | ORAL_TABLET | Freq: Every day | ORAL | Status: DC
  Administered 2011-12-27 – 2011-12-28 (×2): 5 mg via ORAL
  Filled 2011-12-27 (×2): qty 1

## 2011-12-27 MED ORDER — ACETAMINOPHEN 325 MG PO TABS: 650.0000 mg | ORAL_TABLET | Freq: Four times a day (QID) | ORAL | Status: DC | PRN

## 2011-12-27 MED ORDER — CARVEDILOL 25 MG PO TABS
25.0000 mg | ORAL_TABLET | Freq: Two times a day (BID) | ORAL | Status: DC
  Administered 2011-12-28: 25 mg via ORAL
  Filled 2011-12-27 (×3): qty 1

## 2011-12-27 MED ORDER — MULTI-VITAMIN/MINERALS PO TABS: 1.0000 | ORAL_TABLET | Freq: Every day | ORAL | Status: DC

## 2011-12-27 MED ORDER — ENALAPRIL MALEATE 10 MG PO TABS
10.0000 mg | ORAL_TABLET | Freq: Every day | ORAL | Status: DC
  Administered 2011-12-27 – 2011-12-28 (×2): 10 mg via ORAL
  Filled 2011-12-27 (×2): qty 1

## 2011-12-27 MED ORDER — ASPIRIN EC 81 MG PO TBEC
81.0000 mg | DELAYED_RELEASE_TABLET | Freq: Every day | ORAL | Status: DC
  Administered 2011-12-27 – 2011-12-28 (×2): 81 mg via ORAL
  Filled 2011-12-27 (×2): qty 1

## 2011-12-27 MED ORDER — ADULT MULTIVITAMIN W/MINERALS CH
1.0000 | ORAL_TABLET | Freq: Every day | ORAL | Status: DC
  Administered 2011-12-27 – 2011-12-28 (×2): 1 via ORAL
  Filled 2011-12-27 (×2): qty 1

## 2011-12-27 MED ORDER — INSULIN NPH (HUMAN) (ISOPHANE) 100 UNIT/ML ~~LOC~~ SUSP
40.0000 [IU] | Freq: Every day | SUBCUTANEOUS | Status: DC
  Filled 2011-12-27: qty 10

## 2011-12-27 MED ORDER — CALCIUM CARBONATE-VITAMIN D 500-200 MG-UNIT PO TABS
1.0000 | ORAL_TABLET | Freq: Every day | ORAL | Status: DC
  Administered 2011-12-27 – 2011-12-28 (×2): 1 via ORAL
  Filled 2011-12-27 (×3): qty 1

## 2011-12-27 MED ORDER — INSULIN NPH (HUMAN) (ISOPHANE) 100 UNIT/ML ~~LOC~~ SUSP
50.0000 [IU] | Freq: Every day | SUBCUTANEOUS | Status: DC
  Administered 2011-12-28: 50 [IU] via SUBCUTANEOUS
  Filled 2011-12-27: qty 10

## 2011-12-27 NOTE — H&P (Signed)
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 Recently trilipix (fenofibrate) was added to her statin for cholesterol management. Also for the past 5 weeks she has been having pain of her right first metatarsal, was initially diagnosed with gout and given a prednisone taper as well as colchicine. She developed severe diarrhea with the colchicine as well as nausea and had decreased by mouth intake because of this. Yesterday at her primary care physician's office a BMET and a CPK level were drawn. She has a baseline creatinine of 1.7 and her creatinine was found to be 2.2. Her CPK level was found to be around 2300. She is complaining of proximal leg weakness. Her primary care physician called her today and asked her to come to the emergency department, we were asked to admit her for further evaluation and management.  Allergies:   Allergies  Allergen Reactions  . Sulfonamide Derivatives Other (See Comments)    Unknown; "childhood allergy/mother"      Past Medical History  Diagnosis Date  . LBBB (left bundle branch block)   . Carotid bruit   . Dyslipidemia   . Hypertension   . CHF (congestive heart failure)     Diastolic heart failure but with history of variable ejection fractions most recent ejection fraction normal mild LVH June 2012 status post recent hospitalization in June for heart failure with volume overload  . Stroke   . Back pain, chronic   . Cardiomyopathy, ischemic     Status post coronary bypass  grafting no recurrent chest pain  . Cough   . History of recurrent UTIs   . Cystitis, acute   . Rhinosinusitis   . Dysuria   . 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 CRI (chronic renal insufficiency)   . DDD (degenerative disc disease)     multilevel  . Biventricular implantable cardiac defibrillator in situ     Status post device revision secondary to end of life  . Hypoxemia requiring supplemental oxygen   . Candidiasis of vulva and vagina   . Hypothyroidism   . ICD (implantable cardiac defibrillator) in place   . Lymphoma, non Hodgkin's     In remission  . 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 Truett Perna  . Old MI (myocardial infarction)     "I was never treated for it"  . Pneumonia 12/27/11    "3 times; it's been a long time ago"  . OSA on CPAP   . On home oxygen therapy 12/27/2011    "w/CPAP at night"    Past Surgical History  Procedure Date  . Cardiac catheterization 06/03/01  . Cholecystectomy 1970  . Tubal ligation 1972  . Abdominal hysterectomy 1982  . Insert / replace / remove pacemaker 2007; 2012    w/AICD  . Coronary artery bypass graft 2003    CABG X4    Prior to Admission medications   Medication Sig Start Date End Date Taking? Authorizing Provider  allopurinol (ZYLOPRIM) 100 MG  tablet Take 1 tablet (100 mg total) by mouth 2 (two) times daily. 12/13/11 12/12/12 Yes Sandford Craze, NP  amLODipine (NORVASC) 5 MG tablet Take 1 tablet (5 mg total) by mouth daily. 11/23/11  Yes Edwyna Perfect, MD  aspirin EC 81 MG tablet Take 81 mg by mouth daily.   Yes Historical Provider, MD  Calcium 600-200 MG-UNIT per tablet Take 1 tablet by mouth daily.     Yes Historical Provider, MD  carvedilol (COREG) 25 MG tablet Take 2 tablets by mouth twice a day 12/26/11  Yes Edwyna Perfect, MD  Cholecalciferol (VITAMIN D) 1000 UNITS capsule Take 1,000 Units by mouth daily.     Yes Historical Provider, MD  Choline  Fenofibrate (TRILIPIX) 135 MG capsule Take 1 capsule (135 mg total) by mouth daily. 07/03/11 07/02/12 Yes Edwyna Perfect, MD  Choline Fenofibrate 135 MG capsule Take 135 mg by mouth daily.   Yes Historical Provider, MD  citalopram (CELEXA) 20 MG tablet Take 1 tablet (20 mg total) by mouth daily. 11/12/11  Yes Edwyna Perfect, MD  colchicine 0.6 MG tablet Take 0.6 mg by mouth daily as needed. For gout.   Yes Historical Provider, MD  docusate sodium (COLACE) 100 MG capsule Take 100 mg by mouth 2 (two) times daily as needed. For constipation   Yes Historical Provider, MD  enalapril (VASOTEC) 10 MG tablet Take 1 tablet (10 mg total) by mouth daily. 12/26/11  Yes Edwyna Perfect, MD  gabapentin (NEURONTIN) 100 MG capsule Take 1 capsule (100 mg total) by mouth 2 (two) times daily. 12/26/11  Yes Edwyna Perfect, MD  insulin NPH (HUMULIN N,NOVOLIN N) 100 UNIT/ML injection Inject 40-50 Units into the skin 2 (two) times daily. Inject 50 units every morning and inject 40 units every night.   Yes Historical Provider, MD  insulin regular (NOVOLIN R,HUMULIN R) 100 units/mL injection Inject 20-30 Units into the skin 2 (two) times daily before a meal. Inject 30 units every morning and inject 20 units every night.   Yes Historical Provider, MD  levothyroxine (SYNTHROID, LEVOTHROID) 25 MCG tablet Take 1 tablet (25 mcg total) by mouth daily. 10/04/11  Yes Edwyna Perfect, MD  LORazepam (ATIVAN) 0.5 MG tablet Take 1 tablet (0.5 mg total) by mouth at bedtime. 10/01/11  Yes Edwyna Perfect, MD  Multiple Vitamins-Minerals (MULTIVITAMIN WITH MINERALS) tablet Take 1 tablet by mouth daily.     Yes Historical Provider, MD  Omega-3 Fatty Acids (FISH OIL) 1200 MG CAPS Take 2 capsules by mouth 2 (two) times daily.     Yes Historical Provider, MD  rosuvastatin (CRESTOR) 10 MG tablet Take 1 tablet (10 mg total) by mouth daily. 04/03/11  Yes Edwyna Perfect, MD  torsemide (DEMADEX) 20 MG tablet Take 20 mg by mouth 2 (two) times daily.   Yes  Historical Provider, MD  warfarin (COUMADIN) 5 MG tablet Take 5 mg by mouth daily.   Yes Historical Provider, MD    Social History:  reports that she has never smoked. She has never used smokeless tobacco. She reports that she does not drink alcohol or use illicit drugs.  Family History  Problem Relation Age of Onset  . Cancer Mother     kidney and female repo  . Heart disease Father   . Asthma Mother     Review of Systems:  Constitutional: Denies fever, chills, diaphoresis, appetite change and fatigue.  HEENT: Denies photophobia, eye pain, redness, hearing loss, ear pain, congestion, sore  throat, rhinorrhea, sneezing, mouth sores, trouble swallowing, neck pain, neck stiffness and tinnitus.   Respiratory: Denies SOB, DOE, cough, chest tightness,  and wheezing.   Cardiovascular: Denies chest pain, palpitations and leg swelling.  Gastrointestinal: Denies vomiting, abdominal pain,  constipation, blood in stool and abdominal distention.  Genitourinary: Denies dysuria, urgency, frequency, hematuria, flank pain and difficulty urinating.  Musculoskeletal: Denies myalgias, back pain, joint swelling, arthralgias and gait problem.  Skin: Denies pallor, rash and wound.  Neurological: Denies dizziness, seizures, syncope, weakness, light-headedness, numbness and headaches.  Hematological: Denies adenopathy. Easy bruising, personal or family bleeding history  Psychiatric/Behavioral: Denies suicidal ideation, mood changes, confusion, nervousness, sleep disturbance and agitation   Physical Exam: Blood pressure 142/61, pulse 64, temperature 98 F (36.7 C), temperature source Oral, resp. rate 18, height 5\' 4"  (1.626 m), weight 116.5 kg (256 lb 13.4 oz), SpO2 95.00%. General: Alert, awake, oriented x3, in no acute distress, very pleasant and cooperative with examination. HEENT: Normocephalic, atraumatic, pupils equal round and reactive to light, intact extraocular movements, wears corrective lenses,  dry mucous membranes. Neck: Supple, no JVD, no lymphadenopathy, no bruits, no goiter. Cardiovascular: Regular rate and rhythm, no murmurs, rubs or gallops. Respiratory: Clear to auscultation bilaterally. Abdomen: Obese, soft, nontender, nondistended, positive bowel sounds, no masses or organomegaly noted. Extremities: No clubbing, cyanosis. Mild edema and pain to palpation of her right first metatarsal joint. Neurological: Grossly intact and nonfocal. Skin: No rashes.  Labs on Admission:  Results for orders placed during the hospital encounter of 12/27/11 (from the past 48 hour(s))  CBC     Status: Abnormal   Collection Time   12/27/11 10:01 AM      Component Value Range Comment   WBC 6.5  4.0 - 10.5 K/uL    RBC 3.89  3.87 - 5.11 MIL/uL    Hemoglobin 11.9 (*) 12.0 - 15.0 g/dL    HCT 40.9 (*) 81.1 - 46.0 %    MCV 92.0  78.0 - 100.0 fL    MCH 30.6  26.0 - 34.0 pg    MCHC 33.2  30.0 - 36.0 g/dL    RDW 91.4  78.2 - 95.6 %    Platelets 186  150 - 400 K/uL   COMPREHENSIVE METABOLIC PANEL     Status: Abnormal   Collection Time   12/27/11 10:01 AM      Component Value Range Comment   Sodium 138  135 - 145 mEq/L    Potassium 4.8  3.5 - 5.1 mEq/L    Chloride 102  96 - 112 mEq/L    CO2 24  19 - 32 mEq/L    Glucose, Bld 212 (*) 70 - 99 mg/dL    BUN 68 (*) 6 - 23 mg/dL    Creatinine, Ser 2.13 (*) 0.50 - 1.10 mg/dL    Calcium 9.4  8.4 - 08.6 mg/dL    Total Protein 6.7  6.0 - 8.3 g/dL    Albumin 3.5  3.5 - 5.2 g/dL    AST 84 (*) 0 - 37 U/L    ALT 81 (*) 0 - 35 U/L    Alkaline Phosphatase 49  39 - 117 U/L    Total Bilirubin 0.3  0.3 - 1.2 mg/dL    GFR calc non Af Amer 22 (*) >90 mL/min    GFR calc Af Amer 25 (*) >90 mL/min   APTT     Status: Abnormal   Collection Time   12/27/11 10:01 AM  Component Value Range Comment   aPTT 50 (*) 24 - 37 seconds   PROTIME-INR     Status: Abnormal   Collection Time   12/27/11 10:01 AM      Component Value Range Comment   Prothrombin Time 42.0  (*) 11.6 - 15.2 seconds    INR 4.32 (*) 0.00 - 1.49   CK TOTAL AND CKMB     Status: Abnormal   Collection Time   12/27/11 10:15 AM      Component Value Range Comment   Total CK 1932 (*) 7 - 177 U/L    CK, MB 19.1 (*) 0.3 - 4.0 ng/mL    Relative Index 1.0  0.0 - 2.5   POCT I-STAT TROPONIN I     Status: Normal   Collection Time   12/27/11 10:35 AM      Component Value Range Comment   Troponin i, poc 0.03  0.00 - 0.08 ng/mL    Comment 3            GLUCOSE, CAPILLARY     Status: Normal   Collection Time   12/27/11  1:12 PM      Component Value Range Comment   Glucose-Capillary 74  70 - 99 mg/dL     Radiological Exams on Admission: Dg Chest 2 View  12/27/2011  *RADIOLOGY REPORT*  Clinical Data: Shortness of breath and CHF  CHEST - 2 VIEW  Comparison: 01/31/2011  Findings: The cardiac shadow is mildly enlarged.  Defibrillator is again seen.  Postsurgical changes are stable.  Or old right rib fracture is noted.  The lungs are clear bilaterally.  Mild degenerative change of the thoracic spine is seen.  IMPRESSION: Stable cardiomegaly.  No acute abnormality is seen.  Original Report Authenticated By: Phillips Odor, M.D.   Dg Foot Complete Right  12/26/2011  *RADIOLOGY REPORT*  Clinical Data: Pain, no known injury  RIGHT FOOT COMPLETE - 3+ VIEW  Comparison: 12/12/2011  Findings: Three views of the right foot submitted.  No acute fracture or subluxation.  Again noted plantar and posterior spurring of the calcaneus.  IMPRESSION: No acute fracture or subluxation.  Stable spurring of the calcaneus.  Original Report Authenticated By: Natasha Mead, M.D.    Assessment/Plan Principal Problem:  *Rhabdomyolysis Active Problems:  Acute renal failure  DIABETES MELLITUS, WITH AUTONOMIC NEUROPATHY  HYPERTENSION  ATRIAL FIBRILLATION, PAROXYSMAL  MURAL THROMBUS, LEFT VENTRICLE  LYMPHEDEMA  ICD - IN SITU  OSA (obstructive sleep apnea)  Chronic combined systolic and diastolic CHF (congestive heart  failure)  Cardiomyopathy, ischemic  Lymphoma, non Hodgkin's  Gout flare (recent)  Supratherapeutic INR  CKD (chronic kidney disease) stage 3, GFR 30-59 ml/min  Dehydration   #1 acute renal failure on chronic kidney disease stage III: Likely secondary to prerenal azotemia related to colchicine-induced diarrhea. We'll bring into the hospital for gentle IV fluid hydration. Recheck creatinine in the morning.  #2 rhabdomyolysis: I suspect this is related to the statin/fibrate combination. I will discontinue these medications at this time. Upon review of her medication list, it appears that colchicine can also directly cause rhabdomyolysis. Total CK level is around 2000. As above, admit for IV fluid hydration.  #3 obstructive sleep apnea: Nightly CPAP.  #4 paroxysmal atrial fibrillation: Maintained on chronic anticoagulation with Coumadin. INR is supratherapeutic. We'll as pharmacy to dose.  #5 chronic combined CHF: Last echo is from 2011 shows mildly decreased ejection fraction, but was a technically difficult study, with diastolic dysfunction. Will order new echo  since it has been 2 years. Of note in 2006 patient had quadruple vessel bypass and an ejection fraction of 25% and has had an AICD ever since.  #6 DVT prophylaxis: Adequately anticoagulated on Coumadin.  #7 disposition: Anticipate hospital stay of one to 2 days for correction of renal failure and IV fluid hydration.   Time Spent on Admission: 45 minutes.  Chaya Jan [facility] Pager: [phone] 12/27/2011, 4:53 PM

## 2011-12-27 NOTE — ED Provider Notes (Signed)
History     CSN: [id number]  Arrival date & time 12/27/11  9562   First MD Initiated Contact with Patient 12/27/11 1005      Chief Complaint  Patient presents with  . Labs Only    (Consider location/radiation/quality/duration/timing/severity/associated sxs/prior treatment) The history is provided by the patient.   patient was sent in by PCP for worsening renal function and elevated total CK.   she's had diarrhea after she started cultures seen for foot pain. Her renal function increased. Total CK done yesterday was 2500. A chest pain. No abdominal pain. She states that she aches all over. She states she's also had a headache. No fevers. She states that x-rays were done on her foot and it showed a bone spur. No change in her urine. She has a history of CHF. Past Medical History  Diagnosis Date  . ICD (implantable cardiac defibrillator) in place   . LBBB (left bundle branch block)   . Carotid bruit   . Dyslipidemia   . Hypertension   . CHF (congestive heart failure)     Diastolic heart failure but with history of variable ejection fractions most recent ejection fraction normal mild LVH June 2012 status post recent hospitalization in June for heart failure with volume overload  . Stroke   . Back pain, chronic   . Sleep apnea   . Cardiomyopathy, ischemic     Status post coronary bypass grafting no recurrent chest pain  . Cough   . History of recurrent UTIs   . Cystitis, acute   . Rhinosinusitis   . Dysuria   . Thyroid disease     hypothyroidism  . 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 CRI (chronic renal insufficiency)   . 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 DDD (degenerative disc disease)     multilevel  . Lymphoma, non Hodgkin's     In remission  . Biventricular implantable cardiac defibrillator in situ     Status post device revision secondary to end of life  . Hypoxemia requiring supplemental oxygen     . Candidiasis of vulva and vagina     Past Surgical History  Procedure Date  . Abdominal hysterectomy 1982  . Cholecystectomy 1970  . Coronary artery bypass graft 2003  . Tubal ligation   . Cardiac catheterization 06/03/01    Family History  Problem Relation Age of Onset  . Cancer Mother     kidney and female repo  . Heart disease Father   . Asthma Mother     History  Substance Use Topics  . Smoking status: Never Smoker   . Smokeless tobacco: Never Used  . Alcohol Use: No    OB History    Grav Para Term Preterm Abortions TAB SAB Ect Mult Living                  Review of Systems  Constitutional: Negative for activity change and appetite change.  HENT: Negative for neck stiffness.   Eyes: Negative for pain.  Respiratory: Negative for chest tightness and shortness of breath.   Cardiovascular: Negative for chest pain and leg swelling.  Gastrointestinal: Negative for nausea, vomiting, abdominal pain and diarrhea.  Genitourinary: Negative for flank pain.  Musculoskeletal: Positive for myalgias. Negative for back pain.  Skin: Negative for rash.  Neurological: Positive for weakness and headaches. Negative for numbness.  Psychiatric/Behavioral: Negative for behavioral problems.  Allergies  Sulfonamide derivatives  Home Medications   Current Outpatient Rx  Name Route Sig Dispense Refill  . ALLOPURINOL 100 MG PO TABS Oral Take 1 tablet (100 mg total) by mouth 2 (two) times daily. 60 tablet 3  . AMLODIPINE BESYLATE 5 MG PO TABS Oral Take 1 tablet (5 mg total) by mouth daily. 30 tablet 3  . ASPIRIN EC 81 MG PO TBEC Oral Take 81 mg by mouth daily.    Marland Kitchen CALCIUM 600-200 MG-UNIT PO TABS Oral Take 1 tablet by mouth daily.      Marland Kitchen CARVEDILOL 25 MG PO TABS  Take 2 tablets by mouth twice a day 120 tablet 5    Quantity and direction correction 08/23/2011.  Marland Kitchen VITAMIN D 1000 UNITS PO CAPS Oral Take 1,000 Units by mouth daily.      . CHOLINE FENOFIBRATE 135 MG PO CPDR Oral Take  1 capsule (135 mg total) by mouth daily. 30 capsule 6  . CHOLINE FENOFIBRATE 135 MG PO CPDR Oral Take 135 mg by mouth daily.    Marland Kitchen CITALOPRAM HYDROBROMIDE 20 MG PO TABS Oral Take 1 tablet (20 mg total) by mouth daily. 30 tablet 6  . COLCHICINE 0.6 MG PO TABS Oral Take 0.6 mg by mouth daily as needed. For gout.    Marland Kitchen DOCUSATE SODIUM 100 MG PO CAPS Oral Take 100 mg by mouth 2 (two) times daily as needed. For constipation    . ENALAPRIL MALEATE 10 MG PO TABS Oral Take 1 tablet (10 mg total) by mouth daily. 30 tablet 5  . GABAPENTIN 100 MG PO CAPS Oral Take 1 capsule (100 mg total) by mouth 2 (two) times daily. 60 capsule 5  . INSULIN ISOPHANE HUMAN 100 UNIT/ML [place] SUSP Subcutaneous Inject 40-50 Units into the skin 2 (two) times daily. Inject 50 units every morning and inject 40 units every night.    . INSULIN REGULAR HUMAN 100 UNIT/ML IJ SOLN Subcutaneous Inject 20-30 Units into the skin 2 (two) times daily before a meal. Inject 30 units every morning and inject 20 units every night.    Marland Kitchen LEVOTHYROXINE SODIUM 25 MCG PO TABS Oral Take 1 tablet (25 mcg total) by mouth daily. 90 tablet 1  . LORAZEPAM 0.5 MG PO TABS Oral Take 1 tablet (0.5 mg total) by mouth at bedtime. 90 tablet 1  . MULTI-VITAMIN/MINERALS PO TABS Oral Take 1 tablet by mouth daily.      Marland Kitchen FISH OIL 1200 MG PO CAPS Oral Take 2 capsules by mouth 2 (two) times daily.      Marland Kitchen ROSUVASTATIN CALCIUM 10 MG PO TABS Oral Take 1 tablet (10 mg total) by mouth daily. 30 tablet 6  . TORSEMIDE 20 MG PO TABS Oral Take 20 mg by mouth 2 (two) times daily.    . WARFARIN SODIUM 5 MG PO TABS Oral Take 5 mg by mouth daily.      BP 123/73  Pulse 62  Temp 97.9 F (36.6 C) (Oral)  Resp 18  SpO2 100%  Physical Exam  Nursing note and vitals reviewed. Constitutional: She is oriented to person, place, and time. She appears well-developed and well-nourished.  HENT:  Head: Normocephalic and atraumatic.  Eyes: EOM are normal. Pupils are equal, round, and  reactive to light.  Neck: Normal range of motion. Neck supple.  Cardiovascular: Normal rate, regular rhythm and normal heart sounds.   No murmur heard. Pulmonary/Chest: Effort normal and breath sounds normal. No respiratory distress. She has no wheezes. She  has no rales.  Abdominal: Soft. Bowel sounds are normal. She exhibits no distension. There is no tenderness. There is no rebound and no guarding.  Musculoskeletal: Normal range of motion. She exhibits no edema.       Mild tenderness over right foot.  Neurological: She is alert and oriented to person, place, and time. No cranial nerve deficit.  Skin: Skin is warm and dry.  Psychiatric: She has a normal mood and affect. Her speech is normal.    ED Course  Procedures (including critical care time)  Labs Reviewed  CBC - Abnormal; Notable for the following:    Hemoglobin 11.9 (*)     HCT 35.8 (*)     All other components within normal limits  COMPREHENSIVE METABOLIC PANEL - Abnormal; Notable for the following:    Glucose, Bld 212 (*)     BUN 68 (*)     Creatinine, Ser 2.20 (*)     AST 84 (*)     ALT 81 (*)     GFR calc non Af Amer 22 (*)     GFR calc Af Amer 25 (*)     All other components within normal limits  APTT - Abnormal; Notable for the following:    aPTT 50 (*)     All other components within normal limits  PROTIME-INR - Abnormal; Notable for the following:    Prothrombin Time 42.0 (*)     INR 4.32 (*)     All other components within normal limits  CK TOTAL AND CKMB - Abnormal; Notable for the following:    Total CK 1932 (*)     CK, MB 19.1 (*)     All other components within normal limits  POCT I-STAT TROPONIN I   Dg Chest 2 View  12/27/2011  *RADIOLOGY REPORT*  Clinical Data: Shortness of breath and CHF  CHEST - 2 VIEW  Comparison: 01/31/2011  Findings: The cardiac shadow is mildly enlarged.  Defibrillator is again seen.  Postsurgical changes are stable.  Or old right rib fracture is noted.  The lungs are clear  bilaterally.  Mild degenerative change of the thoracic spine is seen.  IMPRESSION: Stable cardiomegaly.  No acute abnormality is seen.  Original Report Authenticated By: Phillips Odor, M.D.   Dg Foot Complete Right  12/26/2011  *RADIOLOGY REPORT*  Clinical Data: Pain, no known injury  RIGHT FOOT COMPLETE - 3+ VIEW  Comparison: 12/12/2011  Findings: Three views of the right foot submitted.  No acute fracture or subluxation.  Again noted plantar and posterior spurring of the calcaneus.  IMPRESSION: No acute fracture or subluxation.  Stable spurring of the calcaneus.  Original Report Authenticated By: Natasha Mead, M.D.     1. Rhabdomyolysis   2. Acute renal failure     Date: 12/27/2011  Rate: 60  Rhythm: Electronically paced  QRS Axis: normal  Intervals: Electronically paced  ST/T Wave abnormalities: Electronically paced  Conduction Disutrbances:Electronically paced  Narrative Interpretation:   Old EKG Reviewed: unchanged     MDM   patient's infrarenal sufficiency and elevated creatinine. Lab recheck creatinine still elevated and CK is is mildly improved. Patient is a history of CHF and will be admitted to medicine for further evaluation.        Juliet Rude. Rubin Payor, MD 12/27/11 1236

## 2011-12-27 NOTE — Progress Notes (Signed)
ANTICOAGULATION CONSULT NOTE - Initial Consult  Pharmacy Consult for Coumadin Indication: atrial fibrillation  Allergies  Allergen Reactions  . Sulfonamide Derivatives Other (See Comments)    Unknown; "childhood allergy/mother"    Patient Measurements: Height: 5\' 4"  (162.6 cm) Weight: 256 lb 13.4 oz (116.5 kg) (Scale A) IBW/kg (Calculated) : 54.7   Vital Signs: Temp: 98 F (36.7 C) (08/15 1607) Temp src: Oral (08/15 1607) BP: 142/61 mmHg (08/15 1607) Pulse Rate: 64  (08/15 1607)  Labs:  Basename 12/27/11 1015 12/27/11 1001 12/26/11 1236 12/26/11 0843  HGB -- 11.9* -- --  HCT -- 35.8* -- --  PLT -- 186 -- --  APTT -- 50* -- --  LABPROT -- 42.0* -- --  INR -- 4.32* -- 5.6  HEPARINUNFRC -- -- -- --  CREATININE -- 2.20* 2.23* --  CKTOTAL 1932* -- 2561* --  CKMB 19.1* -- -- --  TROPONINI -- -- -- --    Estimated Creatinine Clearance: 30.3 ml/min (by C-G formula based on Cr of 2.2).   Medical History: Past Medical History  Diagnosis Date  . LBBB (left bundle branch block)   . Carotid bruit   . Dyslipidemia   . Hypertension   . CHF (congestive heart failure)     Diastolic heart failure but with history of variable ejection fractions most recent ejection fraction normal mild LVH June 2012 status post recent hospitalization in June for heart failure with volume overload  . Back pain, chronic     "just when I walk; mass on 3rd and 4th vertebrae right lower back"  . Cardiomyopathy, ischemic     Status post coronary bypass grafting no recurrent chest pain  . Cough   . History of recurrent UTIs   . Cystitis, acute   . Rhinosinusitis   . Dysuria   . 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 CRI (chronic renal insufficiency)   . Biventricular implantable cardiac defibrillator in situ     Status post device revision secondary to end of life  . Hypoxemia requiring supplemental oxygen   . Candidiasis of vulva and vagina   . Hypothyroidism   .  ICD (implantable cardiac defibrillator) in place   . Lymphoma, non Hodgkin's     In remission  . 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 Truett Perna  . Old MI (myocardial infarction)     "I was never treated for it"  . Pneumonia 12/27/11    "3 times; it's been a long time ago"  . OSA on CPAP   . On home oxygen therapy 12/27/2011    "w/CPAP at night"  . Exertional dyspnea 12/27/2011  . Stroke 12/27/2011    pt denies this history  . DDD (degenerative disc disease)     multilevel  . Gout attack ~ 08/2011    Medications:  Coumadin PTA dose: 5mg  daily, last dose 8/14  Assessment: 71 YOF on coumadin PTA for afib. Pt. Is admitted for evaluation of elevated scr and CPK. INR 4.32 today. Hgb 11.9. plt 186. No active bleeding.    Goal of Therapy:  INR 2-3 Monitor platelets by anticoagulation protocol: Yes   Plan:  - Hold Coumadin today - f/u daily INR  Bayard Hugger, PharmD, BCPS  Clinical Pharmacist  Pager: [phone]   12/27/2011,5:30 PM

## 2011-12-27 NOTE — ED Notes (Signed)
CBG = 74. Pickering MD notified.

## 2011-12-27 NOTE — Progress Notes (Signed)
RT Note: Pt refused to wear CPAP tonight. Pt stated she can only tolerate the nasal pillows and that her husband will bring them to her tomorrow.

## 2011-12-27 NOTE — ED Notes (Signed)
Critical value 19.1ckmb reported by lab.

## 2011-12-27 NOTE — ED Notes (Signed)
Patient states recently Doctor said patient dehydrated and could be the possibility kidney labs were elevated. Patient has no complaints and went to the the Doctor's office yesterday for a check up and general blood work.  States right foot edema trace pedal pulses +2 stated x-ray taken one day ago and said has a bone spur.

## 2011-12-27 NOTE — Progress Notes (Addendum)
Disposition Note  Destiny [name], is a 71 y.o. female,   MRN: [id number]  -  DOB - 1940/12/25  Outpatient Primary MD for the patient is Letitia Libra, Ala Dach, MD   Blood pressure 123/73, pulse 62, temperature 97.9 F (36.6 C), temperature source Oral, resp. rate 18, SpO2 100.00%.  Principal Problem:  *Rhabdomyolysis Active Problems:  Supratherapeutic INR  CKD (chronic kidney disease) stage 3, GFR 30-59 ml/min  Acute renal failure  Dehydration  DIABETES MELLITUS, WITH AUTONOMIC NEUROPATHY  HYPERTENSION  ATRIAL FIBRILLATION, PAROXYSMAL  OSA (obstructive sleep apnea)  Systolic and diastolic CHF, chronic  Cardiomyopathy, ischemic  Gout flare (recent)  MURAL THROMBUS, LEFT VENTRICLE  LYMPHEDEMA  ICD - IN SITU  Lymphoma, non Hodgkin's   71 year old female sent to the emergency department today because of abnormal lab values. Outpatient physician records reviewed. Patient has recently been treated for acute gout exacerbation. Recently with progression in chronic kidney disease and elevated serum creatinine with patient also endorsing sensation of feeling dehydrated and weak. Because of this but to rest him I dosage was decreased by the primary care physician on 12/26/2011. Laboratory data obtained on the same date did reveal findings consistent with acute rhabdomyolysis (CPK 2561) and acute renal failure. In addition INR supratherapeutic at 5.6. Laboratory data have been completed in the ER and shows persistent elevation in CPK but has decreased slightly to 1932. INR has also decreased to 4.3. Because of patient's underlying combined systolic and diastolic heart failure the ER physician did not feel comfortable with rapid aggressive rehydration and sending patient home. Her hemoglobin has decreased 2 g since yesterday and unclear if this is a spurious laboratory result or if this is patient reequilibrated with decreased dose of diuretic. She is not experiencing any signs or symptoms of active  bleeding and apparently is not having any abdominal back or flank pain that would be concerning for retroperitoneal hematoma. I discussed this patient with the emergency room physician Dr. Rubin Payor and we determined this patient would be appropriate for telemetry bed. I requested the ER physician placed temporary holding orders and include IV fluids at 75 cc per hour. In addition last echocardiogram was in 2012 and I requested for him to place an order for echocardiogram with contrast. Notify the flow manager of the bed request. Please note that I did not interview or examine this patient.  Junious Silk, ANP

## 2011-12-27 NOTE — Progress Notes (Signed)
1600 Transferred in from ER via Lobbyist and monitor . Fully awake alert . Made pt comfortable in bed

## 2011-12-27 NOTE — ED Notes (Signed)
Pt was sent here for abnormal labs creatnine levels up pt not sure of level

## 2011-12-28 ENCOUNTER — Telehealth: Payer: Self-pay | Admitting: Internal Medicine

## 2011-12-28 ENCOUNTER — Other Ambulatory Visit: Payer: Self-pay | Admitting: Internal Medicine

## 2011-12-28 DIAGNOSIS — I517 Cardiomegaly: Secondary | ICD-10-CM

## 2011-12-28 DIAGNOSIS — N289 Disorder of kidney and ureter, unspecified: Secondary | ICD-10-CM

## 2011-12-28 DIAGNOSIS — M6282 Rhabdomyolysis: Secondary | ICD-10-CM

## 2011-12-28 LAB — GLUCOSE, CAPILLARY: Glucose-Capillary: 207 mg/dL — ABNORMAL HIGH (ref 70–99)

## 2011-12-28 LAB — REMOTE ICD DEVICE
AL IMPEDENCE ICD: 340 Ohm
BAMS-0003: 70 {beats}/min
HV IMPEDENCE: 49 Ohm
LV LEAD IMPEDENCE ICD: 290 Ohm
RV LEAD IMPEDENCE ICD: 430 Ohm
TZAT-0001SLOWVT: 1
TZAT-0004FASTVT: 8
TZAT-0004SLOWVT: 8
TZAT-0012FASTVT: 200 ms
TZAT-0012SLOWVT: 200 ms
TZAT-0013FASTVT: 2
TZAT-0019FASTVT: 7.5 V
TZAT-0019SLOWVT: 7.5 V
TZAT-0020FASTVT: 1 ms
TZON-0003FASTVT: 310 ms
TZON-0004FASTVT: 16
TZON-0004SLOWVT: 24
TZON-0005FASTVT: 6
TZON-0010SLOWVT: 40 ms
TZST-0001FASTVT: 3
TZST-0001FASTVT: 4
TZST-0001FASTVT: 5
TZST-0001SLOWVT: 3
TZST-0001SLOWVT: 5
TZST-0003FASTVT: 36 J
TZST-0003FASTVT: 40 J
TZST-0003SLOWVT: 15 J
TZST-0003SLOWVT: 40 J

## 2011-12-28 LAB — BASIC METABOLIC PANEL
BUN: 59 mg/dL — ABNORMAL HIGH (ref 6–23)
GFR calc non Af Amer: 24 mL/min — ABNORMAL LOW (ref >90)
Glucose, Bld: 226 mg/dL — ABNORMAL HIGH (ref 70–99)
Potassium: 5.1 mEq/L (ref 3.5–5.1)

## 2011-12-28 LAB — CBC
Hemoglobin: 11.1 g/dL — ABNORMAL LOW (ref 12.0–15.0)
MCH: 31.1 pg (ref 26.0–34.0)
MCHC: 33.6 g/dL (ref 30.0–36.0)
MCV: 92.4 fL (ref 78.0–100.0)

## 2011-12-28 LAB — CK: Total CK: 994 U/L — ABNORMAL HIGH (ref 7–177)

## 2011-12-28 LAB — PROTIME-INR: Prothrombin Time: 31.2 seconds — ABNORMAL HIGH (ref 11.6–15.2)

## 2011-12-28 MED ORDER — WARFARIN SODIUM 5 MG PO TABS
5.0000 mg | ORAL_TABLET | Freq: Once | ORAL | Status: DC
  Filled 2011-12-28: qty 1

## 2011-12-28 MED ORDER — BIOTENE DRY MOUTH MT LIQD
15.0000 mL | Freq: Two times a day (BID) | OROMUCOSAL | Status: DC
  Administered 2011-12-28: 15 mL via OROMUCOSAL

## 2011-12-28 NOTE — Telephone Encounter (Signed)
Sharon-could you please order labs for this patient. Chem7-renal insufficiency. Patient will be going to the [facility] lab on Monday. She has scheduled a post hospital follow up for 01/03/12.

## 2011-12-28 NOTE — Progress Notes (Signed)
*  PRELIMINARY RESULTS* Echocardiogram 2D Echocardiogram has been performed.  Jeryl Columbia 12/28/2011, 12:10 PM

## 2011-12-28 NOTE — Progress Notes (Signed)
ANTICOAGULATION CONSULT NOTE - Follow Up Consult  Pharmacy Consult for Coumadin Indication: atrial fibrillation  Allergies  Allergen Reactions  . Sulfonamide Derivatives Other (See Comments)    Unknown; "childhood allergy/mother"   Vital Signs: Temp: 97.8 F (36.6 C) (08/16 0521) Temp src: Oral (08/16 0521) BP: 147/37 mmHg (08/16 0914) Pulse Rate: 65  (08/16 0914)  Labs:  Basename 12/28/11 0624 12/27/11 1015 12/27/11 1001 12/26/11 1236 12/26/11 0843  HGB 11.1* -- 11.9* -- --  HCT 33.0* -- 35.8* -- --  PLT 183 -- 186 -- --  APTT -- -- 50* -- --  LABPROT 31.2* -- 42.0* -- --  INR 2.95* -- 4.32* -- 5.6  HEPARINUNFRC -- -- -- -- --  CREATININE 2.03* -- 2.20* 2.23* --  CKTOTAL -- 1932* -- 2561* --  CKMB -- 19.1* -- -- --  TROPONINI -- -- -- -- --    Estimated Creatinine Clearance: 34 ml/min (by C-G formula based on Cr of 2.03).  Assessment: 71yof on coumadin pta for afib, admitted with supratherapeutic INR in the setting of ARF and rhabdo. INR back within therapeutic range today after holding last night's dose. Small decrease in Hgb/Hct but no bleeding noted. AST/ALT elevated. Will resume coumadin tonight at home dose.  Goal of Therapy:  INR 2-3 Monitor platelets by anticoagulation protocol: Yes   Plan:  1) Coumadin 5mg  x 1 2) Follow up INR in AM  Fredrik Rigger 12/28/2011,10:06 AM

## 2011-12-28 NOTE — Discharge Summary (Signed)
Physician Discharge Summary  [name] MRN: [id number] DOB/AGE: [birth year] 71 y.o.  Admit date: 12/27/2011 Discharge date: 12/28/2011  Primary Care Physician:  Letitia Libra, Ala Dach, MD   Discharge Diagnoses:    Principal Problem:  *Rhabdomyolysis Active Problems:  Acute renal failure  DIABETES MELLITUS, WITH AUTONOMIC NEUROPATHY  HYPERTENSION  ATRIAL FIBRILLATION, PAROXYSMAL  MURAL THROMBUS, LEFT VENTRICLE  LYMPHEDEMA  ICD - IN SITU  OSA (obstructive sleep apnea)  Chronic combined systolic and diastolic CHF (congestive heart failure)  Cardiomyopathy, ischemic  Lymphoma, non Hodgkin's  Gout flare (recent)  Supratherapeutic INR  CKD (chronic kidney disease) stage 3, GFR 30-59 ml/min  Dehydration    Medication List  As of 12/28/2011 10:22 AM   STOP taking these medications         Choline Fenofibrate 135 MG capsule      COLACE 100 MG capsule      colchicine 0.6 MG tablet         TAKE these medications         allopurinol 100 MG tablet   Commonly known as: ZYLOPRIM   Take 1 tablet (100 mg total) by mouth 2 (two) times daily.      amLODipine 5 MG tablet   Commonly known as: NORVASC   Take 1 tablet (5 mg total) by mouth daily.      aspirin EC 81 MG tablet   Take 81 mg by mouth daily.      Calcium 600-200 MG-UNIT per tablet   Take 1 tablet by mouth daily.      carvedilol 25 MG tablet   Commonly known as: COREG   Take 2 tablets by mouth twice a day      citalopram 20 MG tablet   Commonly known as: CELEXA   Take 1 tablet (20 mg total) by mouth daily.      enalapril 10 MG tablet   Commonly known as: VASOTEC   Take 1 tablet (10 mg total) by mouth daily.      Fish Oil 1200 MG Caps   Take 2 capsules by mouth 2 (two) times daily.      gabapentin 100 MG capsule   Commonly known as: NEURONTIN   Take 1 capsule (100 mg total) by mouth 2 (two) times daily.      insulin NPH 100 UNIT/ML injection   Commonly known as: HUMULIN N,NOVOLIN N   Inject 40-50 Units into the skin 2 (two) times daily. Inject 50 units every morning and inject 40 units every night.      insulin regular 100 units/mL injection   Commonly known as: NOVOLIN R,HUMULIN R   Inject 20-30 Units into the skin 2 (two) times daily before a meal. Inject 30 units every morning and inject 20 units every night.      levothyroxine 25 MCG tablet   Commonly known as: SYNTHROID, LEVOTHROID   Take 1 tablet (25 mcg total) by mouth daily.      LORazepam 0.5 MG tablet   Commonly known as: ATIVAN   Take 1 tablet (0.5 mg total) by mouth at bedtime.      multivitamin with minerals tablet   Take 1 tablet by mouth daily.      rosuvastatin 10 MG tablet   Commonly known as: CRESTOR   Take 1 tablet (10 mg total) by mouth daily.      torsemide 20 MG tablet   Commonly known as: DEMADEX   Take 20 mg by mouth 2 (two)  times daily.      Vitamin D 1000 UNITS capsule   Take 1,000 Units by mouth daily.      warfarin 5 MG tablet   Commonly known as: COUMADIN   Take 5 mg by mouth daily.             Disposition and Follow-up:  Patient will be discharged home today in stable and improved condition. She is instructed to followup with her primary care physician early next week for repeat labs. She will need a BMET to follow on her renal failure as well as a CPK level to ensure that it is trending down. She is also instructed to followup at the [facility]madin clinic as scheduled.  Consults:  None    Significant Diagnostic Studies:  Dg Chest 2 View  12/27/2011  *RADIOLOGY REPORT*  Clinical Data: Shortness of breath and CHF  CHEST - 2 VIEW  Comparison: 01/31/2011  Findings: The cardiac shadow is mildly enlarged.  Defibrillator is again seen.  Postsurgical changes are stable.  Or old right rib fracture is noted.  The lungs are clear bilaterally.  Mild degenerative change of the thoracic spine is seen.  IMPRESSION: Stable cardiomegaly.  No acute abnormality is seen.  Original Report  Authenticated By: Phillips Odor, M.D.   Dg Foot Complete Right  12/26/2011  *RADIOLOGY REPORT*  Clinical Data: Pain, no known injury  RIGHT FOOT COMPLETE - 3+ VIEW  Comparison: 12/12/2011  Findings: Three views of the right foot submitted.  No acute fracture or subluxation.  Again noted plantar and posterior spurring of the calcaneus.  IMPRESSION: No acute fracture or subluxation.  Stable spurring of the calcaneus.  Original Report Authenticated By: Natasha Mead, M.D.    Brief H and P: For complete details please refer to admission H and P, but in brief patient is a 71 year old woman with history of chronic combined CHF, chronic kidney disease stage III, diabetes, hypertension, hyperlipidemia, atrial fibrillation who was sent to the hospital by her primary care physician after receiving results of lab work that showed acute on chronic renal failure as well as an increased CPK level. We are asked to admit her for further evaluation and management.    Hospital Course:  Principal Problem:  *Rhabdomyolysis Active Problems:  Acute renal failure  DIABETES MELLITUS, WITH AUTONOMIC NEUROPATHY  HYPERTENSION  ATRIAL FIBRILLATION, PAROXYSMAL  MURAL THROMBUS, LEFT VENTRICLE  LYMPHEDEMA  ICD - IN SITU  OSA (obstructive sleep apnea)  Chronic combined systolic and diastolic CHF (congestive heart failure)  Cardiomyopathy, ischemic  Lymphoma, non Hodgkin's  Gout flare (recent)  Supratherapeutic INR  CKD (chronic kidney disease) stage 3, GFR 30-59 ml/min  Dehydration   #1 acute on chronic kidney disease stage III: Baseline creatinine is about 1.7. On admission she had a creatinine of 2.25 which is down to 2.0 today. I suspect that her acute renal failure is secondary to prerenal azotemia related to colchicine-induced diarrhea in addition to mild rhabdomyolysis. She has been instructed to take copious amounts of by mouth fluids. And to return to her primary care physician in about 4 days for repeat renal  function testing.  #2 rhabdomyolysis: Recently a fibrate was added to her statin regimen. This combination is known to cause rhabdomyolysis. Patient had a CPK level of 2500 that has dropped to roughly 1500. In addition she was starting to have thigh muscle pain and weakness likely related by muscle breakdown. I have suggested that she discontinue use of the fibrate and stay  on her statin only. Again she has been instructed to take copious fluids throughout the weekend.  All rest of her chronic medical conditions have been stable this hospitalization. Especially she has showed no signs of overt CHF with IV fluid repletion.  Time spent on Discharge: Greater than 30 minutes.  SignedChaya Jan [facility] Pager: [phone] 12/28/2011, 10:22 AM

## 2011-12-28 NOTE — Telephone Encounter (Signed)
Done per [facility]S

## 2011-12-28 NOTE — Telephone Encounter (Signed)
Patient states that she is in the hospital now. She states that the doctor there(Dr. Ardyth Harps) advised her to see her primary before next Tuesday for additional lab work. Dr. Rodena Medin will be out of the office beginning of next week. Should she see Melissa? Or, does she just need to come in for labs?   FYI: patient states that you will need to dial [phone] when calling her back at [phone]

## 2011-12-28 NOTE — Telephone Encounter (Signed)
i can certainly see her Thursday and she could get a chem7-renal insufficiency on mon or tues. If has to be seen before then I spoke with [facility] and she was agreeable

## 2011-12-28 NOTE — Progress Notes (Signed)
Diffinity done per protocol

## 2011-12-28 NOTE — Progress Notes (Signed)
1330discharge instructions given to pt . Verbalized understanding. Wheeled to lobby by NT

## 2011-12-31 ENCOUNTER — Other Ambulatory Visit: Payer: Self-pay | Admitting: *Deleted

## 2011-12-31 ENCOUNTER — Ambulatory Visit: Payer: Medicare Other

## 2011-12-31 DIAGNOSIS — M6282 Rhabdomyolysis: Secondary | ICD-10-CM

## 2011-12-31 DIAGNOSIS — N289 Disorder of kidney and ureter, unspecified: Secondary | ICD-10-CM

## 2011-12-31 LAB — BASIC METABOLIC PANEL
BUN: 37 mg/dL — ABNORMAL HIGH (ref 6–23)
CO2: 26 mEq/L (ref 19–32)
Chloride: 106 mEq/L (ref 96–112)
Potassium: 5.3 mEq/L — ABNORMAL HIGH (ref 3.5–5.1)

## 2011-12-31 LAB — CK: Total CK: 299 U/L — ABNORMAL HIGH (ref 7–177)

## 2011-12-31 NOTE — Progress Notes (Signed)
Per pt request, via phone from [facility] lab/SLS

## 2012-01-01 ENCOUNTER — Ambulatory Visit: Payer: Medicare Other | Admitting: Internal Medicine

## 2012-01-03 ENCOUNTER — Ambulatory Visit (INDEPENDENT_AMBULATORY_CARE_PROVIDER_SITE_OTHER): Payer: Medicare Other | Admitting: Internal Medicine

## 2012-01-03 ENCOUNTER — Encounter: Payer: Self-pay | Admitting: Internal Medicine

## 2012-01-03 ENCOUNTER — Ambulatory Visit (INDEPENDENT_AMBULATORY_CARE_PROVIDER_SITE_OTHER): Payer: Medicare Other

## 2012-01-03 VITALS — BP 122/68 | HR 60 | Temp 98.4°F | Resp 16 | Wt 264.5 lb

## 2012-01-03 DIAGNOSIS — E785 Hyperlipidemia, unspecified: Secondary | ICD-10-CM | POA: Diagnosis not present

## 2012-01-03 DIAGNOSIS — I6350 Cerebral infarction due to unspecified occlusion or stenosis of unspecified cerebral artery: Secondary | ICD-10-CM | POA: Diagnosis not present

## 2012-01-03 DIAGNOSIS — M6282 Rhabdomyolysis: Secondary | ICD-10-CM

## 2012-01-03 DIAGNOSIS — I4891 Unspecified atrial fibrillation: Secondary | ICD-10-CM

## 2012-01-03 DIAGNOSIS — I238 Other current complications following acute myocardial infarction: Secondary | ICD-10-CM

## 2012-01-03 DIAGNOSIS — M79609 Pain in unspecified limb: Secondary | ICD-10-CM | POA: Diagnosis not present

## 2012-01-03 DIAGNOSIS — M79671 Pain in right foot: Secondary | ICD-10-CM

## 2012-01-03 NOTE — Assessment & Plan Note (Signed)
Resolved

## 2012-01-03 NOTE — Assessment & Plan Note (Signed)
Fenofibrate stopped with dx of rhabdo. Attempt to avoid dual therapy if possible

## 2012-01-03 NOTE — Patient Instructions (Signed)
Please cancel next appointment for next week

## 2012-01-03 NOTE — Assessment & Plan Note (Signed)
Resolving. Asx. Likely contribution from statin, fenofibrate, prerenal state and colchicine. Repeat CK with next visit

## 2012-01-03 NOTE — Progress Notes (Signed)
  Subjective:    [name], female    DOB: [birth year], 71 y.o.   MRN: [id number]  HPI Pt presents to clinic for followup of multiple medical problems. S/p hospitalization for acute on chronic renal insufficiency and rhabdo. Feels much improved. Also right foot swelling spontaneously improved and pain resolved. Reviewed recent repeat chem7 with improved cr 1.6 and declining ck enzyme.   Past Medical History  Diagnosis Date  . LBBB (left bundle branch block)   . Carotid bruit   . Dyslipidemia   . Hypertension   . CHF (congestive heart failure)     Diastolic heart failure but with history of variable ejection fractions most recent ejection fraction normal mild LVH June 2012 status post recent hospitalization in June for heart failure with volume overload  . Back pain, chronic     "just when I walk; mass on 3rd and 4th vertebrae right lower back"  . Cardiomyopathy, ischemic     Status post coronary bypass grafting no recurrent chest pain  . Cough   . History of recurrent UTIs   . Cystitis, acute   . Rhinosinusitis   . Dysuria   . 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 CRI (chronic renal insufficiency)   . Biventricular implantable cardiac defibrillator in situ     Status post device revision secondary to end of life  . Hypoxemia requiring supplemental oxygen   . Candidiasis of vulva and vagina   . Hypothyroidism   . ICD (implantable cardiac defibrillator) in place   . Lymphoma, non Hodgkin's     In remission  . 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 Truett Perna  . Old MI (myocardial infarction)     "I was never treated for it"  . Pneumonia 12/27/11    "3 times; it's been a long time ago"  . OSA on CPAP   . On home oxygen therapy 12/27/2011    "w/CPAP at night"  . Exertional dyspnea 12/27/2011  . Stroke 12/27/2011    pt denies this history  . DDD (degenerative disc disease)     multilevel  . Gout  attack ~ 08/2011   Past Surgical History  Procedure Date  . Cardiac catheterization 06/03/01  . Cholecystectomy 1970  . Tubal ligation 1972  . Abdominal hysterectomy 1982  . Insert / replace / remove pacemaker 2007; 2012    w/AICD  . Coronary artery bypass graft 2003    CABG X4    reports that she has never smoked. She has never used smokeless tobacco. She reports that she does not drink alcohol or use illicit drugs. family history includes Asthma in her mother; Cancer in her mother; and Heart disease in her father. Allergies  Allergen Reactions  . Sulfonamide Derivatives Other (See Comments)    Unknown; "childhood allergy/mother"      Review of Systems see hpi     Objective:   Physical Exam  Nursing note and vitals reviewed. Constitutional: She appears well-developed and well-nourished. No distress.  HENT:  Head: Normocephalic and atraumatic.  Neurological: She is alert.  Skin: She is not diaphoretic.  Psychiatric: She has a normal mood and affect.          Assessment & Plan:

## 2012-01-03 NOTE — Assessment & Plan Note (Signed)
Improving to baseline. Suspected contribution from prerenal state as well as rhabdo.

## 2012-01-04 ENCOUNTER — Encounter: Payer: Self-pay | Admitting: *Deleted

## 2012-01-04 MED FILL — Perflutren Lipid Microsphere IV Susp 6.52 MG/ML: INTRAVENOUS | Qty: 2 | Status: AC

## 2012-01-07 ENCOUNTER — Ambulatory Visit: Payer: Medicare Other | Admitting: Internal Medicine

## 2012-01-08 DIAGNOSIS — H2589 Other age-related cataract: Secondary | ICD-10-CM | POA: Diagnosis not present

## 2012-01-08 DIAGNOSIS — IMO0002 Reserved for concepts with insufficient information to code with codable children: Secondary | ICD-10-CM | POA: Diagnosis not present

## 2012-01-08 DIAGNOSIS — H25049 Posterior subcapsular polar age-related cataract, unspecified eye: Secondary | ICD-10-CM | POA: Diagnosis not present

## 2012-01-08 DIAGNOSIS — H2510 Age-related nuclear cataract, unspecified eye: Secondary | ICD-10-CM | POA: Diagnosis not present

## 2012-01-15 ENCOUNTER — Telehealth: Payer: Self-pay | Admitting: Internal Medicine

## 2012-01-15 DIAGNOSIS — E039 Hypothyroidism, unspecified: Secondary | ICD-10-CM

## 2012-01-15 MED ORDER — LEVOTHYROXINE SODIUM 25 MCG PO TABS: 25.0000 ug | ORAL_TABLET | Freq: Every day | ORAL | Status: DC

## 2012-01-15 NOTE — Telephone Encounter (Signed)
Rx done/SLS 

## 2012-01-15 NOTE — Telephone Encounter (Signed)
Refill- levothroid tab. Take one tablet by mouth every day. Qty 90 last fill 8.31.13

## 2012-01-17 ENCOUNTER — Ambulatory Visit (INDEPENDENT_AMBULATORY_CARE_PROVIDER_SITE_OTHER): Payer: Medicare Other | Admitting: Pharmacist

## 2012-01-17 DIAGNOSIS — I238 Other current complications following acute myocardial infarction: Secondary | ICD-10-CM | POA: Diagnosis not present

## 2012-01-17 DIAGNOSIS — I4891 Unspecified atrial fibrillation: Secondary | ICD-10-CM

## 2012-01-17 DIAGNOSIS — I6350 Cerebral infarction due to unspecified occlusion or stenosis of unspecified cerebral artery: Secondary | ICD-10-CM | POA: Diagnosis not present

## 2012-01-23 ENCOUNTER — Telehealth: Payer: Self-pay | Admitting: Oncology

## 2012-01-23 NOTE — Telephone Encounter (Signed)
Pt called to r/s 9/12 appt to 10/1.

## 2012-01-24 ENCOUNTER — Ambulatory Visit: Payer: Medicare Other | Admitting: Nurse Practitioner

## 2012-01-27 DIAGNOSIS — J189 Pneumonia, unspecified organism: Secondary | ICD-10-CM | POA: Diagnosis not present

## 2012-01-27 DIAGNOSIS — Z794 Long term (current) use of insulin: Secondary | ICD-10-CM

## 2012-01-27 DIAGNOSIS — J9620 Acute and chronic respiratory failure, unspecified whether with hypoxia or hypercapnia: Secondary | ICD-10-CM | POA: Diagnosis present

## 2012-01-27 DIAGNOSIS — J9819 Other pulmonary collapse: Secondary | ICD-10-CM | POA: Diagnosis not present

## 2012-01-27 DIAGNOSIS — Z23 Encounter for immunization: Secondary | ICD-10-CM

## 2012-01-27 DIAGNOSIS — I501 Left ventricular failure: Secondary | ICD-10-CM | POA: Diagnosis not present

## 2012-01-27 DIAGNOSIS — S8000XA Contusion of unspecified knee, initial encounter: Secondary | ICD-10-CM | POA: Diagnosis not present

## 2012-01-27 DIAGNOSIS — E1149 Type 2 diabetes mellitus with other diabetic neurological complication: Secondary | ICD-10-CM | POA: Diagnosis present

## 2012-01-27 DIAGNOSIS — E11319 Type 2 diabetes mellitus with unspecified diabetic retinopathy without macular edema: Secondary | ICD-10-CM | POA: Diagnosis present

## 2012-01-27 DIAGNOSIS — Z7901 Long term (current) use of anticoagulants: Secondary | ICD-10-CM

## 2012-01-27 DIAGNOSIS — I4891 Unspecified atrial fibrillation: Secondary | ICD-10-CM | POA: Diagnosis present

## 2012-01-27 DIAGNOSIS — L03119 Cellulitis of unspecified part of limb: Secondary | ICD-10-CM | POA: Diagnosis present

## 2012-01-27 DIAGNOSIS — Z95810 Presence of automatic (implantable) cardiac defibrillator: Secondary | ICD-10-CM

## 2012-01-27 DIAGNOSIS — I252 Old myocardial infarction: Secondary | ICD-10-CM

## 2012-01-27 DIAGNOSIS — E039 Hypothyroidism, unspecified: Secondary | ICD-10-CM | POA: Diagnosis present

## 2012-01-27 DIAGNOSIS — I129 Hypertensive chronic kidney disease with stage 1 through stage 4 chronic kidney disease, or unspecified chronic kidney disease: Secondary | ICD-10-CM | POA: Diagnosis present

## 2012-01-27 DIAGNOSIS — E1139 Type 2 diabetes mellitus with other diabetic ophthalmic complication: Secondary | ICD-10-CM | POA: Diagnosis present

## 2012-01-27 DIAGNOSIS — M549 Dorsalgia, unspecified: Secondary | ICD-10-CM | POA: Diagnosis present

## 2012-01-27 DIAGNOSIS — Z9981 Dependence on supplemental oxygen: Secondary | ICD-10-CM

## 2012-01-27 DIAGNOSIS — I2589 Other forms of chronic ischemic heart disease: Secondary | ICD-10-CM | POA: Diagnosis present

## 2012-01-27 DIAGNOSIS — Z87898 Personal history of other specified conditions: Secondary | ICD-10-CM

## 2012-01-27 DIAGNOSIS — G909 Disorder of the autonomic nervous system, unspecified: Secondary | ICD-10-CM | POA: Diagnosis present

## 2012-01-27 DIAGNOSIS — I472 Ventricular tachycardia, unspecified: Secondary | ICD-10-CM | POA: Diagnosis present

## 2012-01-27 DIAGNOSIS — W19XXXA Unspecified fall, initial encounter: Secondary | ICD-10-CM | POA: Diagnosis present

## 2012-01-27 DIAGNOSIS — N183 Chronic kidney disease, stage 3 unspecified: Secondary | ICD-10-CM | POA: Diagnosis present

## 2012-01-27 DIAGNOSIS — I509 Heart failure, unspecified: Secondary | ICD-10-CM | POA: Diagnosis present

## 2012-01-27 DIAGNOSIS — J90 Pleural effusion, not elsewhere classified: Secondary | ICD-10-CM | POA: Diagnosis not present

## 2012-01-27 DIAGNOSIS — I5031 Acute diastolic (congestive) heart failure: Principal | ICD-10-CM | POA: Diagnosis present

## 2012-01-27 DIAGNOSIS — R0602 Shortness of breath: Secondary | ICD-10-CM | POA: Diagnosis not present

## 2012-01-27 DIAGNOSIS — I4729 Other ventricular tachycardia: Secondary | ICD-10-CM | POA: Diagnosis present

## 2012-01-27 DIAGNOSIS — G4733 Obstructive sleep apnea (adult) (pediatric): Secondary | ICD-10-CM | POA: Diagnosis present

## 2012-01-27 DIAGNOSIS — L02419 Cutaneous abscess of limb, unspecified: Secondary | ICD-10-CM | POA: Diagnosis present

## 2012-01-27 DIAGNOSIS — Z951 Presence of aortocoronary bypass graft: Secondary | ICD-10-CM

## 2012-01-27 DIAGNOSIS — J9610 Chronic respiratory failure, unspecified whether with hypoxia or hypercapnia: Secondary | ICD-10-CM | POA: Diagnosis present

## 2012-01-27 DIAGNOSIS — Z6841 Body Mass Index (BMI) 40.0 and over, adult: Secondary | ICD-10-CM

## 2012-01-27 DIAGNOSIS — E119 Type 2 diabetes mellitus without complications: Secondary | ICD-10-CM | POA: Diagnosis not present

## 2012-01-28 ENCOUNTER — Encounter (HOSPITAL_COMMUNITY): Payer: Self-pay | Admitting: *Deleted

## 2012-01-28 ENCOUNTER — Inpatient Hospital Stay (HOSPITAL_COMMUNITY)
Admission: EM | Admit: 2012-01-28 | Discharge: 2012-01-31 | DRG: 291 | Disposition: A | Discharge status: Home / Self Care | Payer: Medicare Other | Attending: Internal Medicine | Admitting: Internal Medicine

## 2012-01-28 ENCOUNTER — Emergency Department (HOSPITAL_COMMUNITY): Payer: Medicare Other

## 2012-01-28 ENCOUNTER — Inpatient Hospital Stay (HOSPITAL_COMMUNITY): Payer: Medicare Other

## 2012-01-28 DIAGNOSIS — I5033 Acute on chronic diastolic (congestive) heart failure: Secondary | ICD-10-CM

## 2012-01-28 DIAGNOSIS — G4733 Obstructive sleep apnea (adult) (pediatric): Secondary | ICD-10-CM | POA: Diagnosis present

## 2012-01-28 DIAGNOSIS — Z8572 Personal history of non-Hodgkin lymphomas: Secondary | ICD-10-CM | POA: Diagnosis present

## 2012-01-28 DIAGNOSIS — Z6841 Body Mass Index (BMI) 40.0 and over, adult: Secondary | ICD-10-CM | POA: Diagnosis not present

## 2012-01-28 DIAGNOSIS — M549 Dorsalgia, unspecified: Secondary | ICD-10-CM | POA: Diagnosis present

## 2012-01-28 DIAGNOSIS — J9620 Acute and chronic respiratory failure, unspecified whether with hypoxia or hypercapnia: Secondary | ICD-10-CM | POA: Diagnosis present

## 2012-01-28 DIAGNOSIS — N289 Disorder of kidney and ureter, unspecified: Secondary | ICD-10-CM

## 2012-01-28 DIAGNOSIS — I4891 Unspecified atrial fibrillation: Secondary | ICD-10-CM | POA: Diagnosis not present

## 2012-01-28 DIAGNOSIS — G4730 Sleep apnea, unspecified: Secondary | ICD-10-CM

## 2012-01-28 DIAGNOSIS — J9610 Chronic respiratory failure, unspecified whether with hypoxia or hypercapnia: Secondary | ICD-10-CM | POA: Diagnosis present

## 2012-01-28 DIAGNOSIS — I5031 Acute diastolic (congestive) heart failure: Secondary | ICD-10-CM | POA: Diagnosis present

## 2012-01-28 DIAGNOSIS — E785 Hyperlipidemia, unspecified: Secondary | ICD-10-CM

## 2012-01-28 DIAGNOSIS — E1149 Type 2 diabetes mellitus with other diabetic neurological complication: Secondary | ICD-10-CM | POA: Diagnosis present

## 2012-01-28 DIAGNOSIS — Z9981 Dependence on supplemental oxygen: Secondary | ICD-10-CM | POA: Diagnosis not present

## 2012-01-28 DIAGNOSIS — R0602 Shortness of breath: Secondary | ICD-10-CM | POA: Diagnosis not present

## 2012-01-28 DIAGNOSIS — R0603 Acute respiratory distress: Secondary | ICD-10-CM

## 2012-01-28 DIAGNOSIS — L02419 Cutaneous abscess of limb, unspecified: Secondary | ICD-10-CM | POA: Diagnosis present

## 2012-01-28 DIAGNOSIS — E1139 Type 2 diabetes mellitus with other diabetic ophthalmic complication: Secondary | ICD-10-CM

## 2012-01-28 DIAGNOSIS — M6282 Rhabdomyolysis: Secondary | ICD-10-CM

## 2012-01-28 DIAGNOSIS — I255 Ischemic cardiomyopathy: Secondary | ICD-10-CM

## 2012-01-28 DIAGNOSIS — I4729 Other ventricular tachycardia: Secondary | ICD-10-CM | POA: Diagnosis not present

## 2012-01-28 DIAGNOSIS — I509 Heart failure, unspecified: Secondary | ICD-10-CM | POA: Diagnosis not present

## 2012-01-28 DIAGNOSIS — E119 Type 2 diabetes mellitus without complications: Secondary | ICD-10-CM

## 2012-01-28 DIAGNOSIS — M25569 Pain in unspecified knee: Secondary | ICD-10-CM | POA: Diagnosis not present

## 2012-01-28 DIAGNOSIS — E039 Hypothyroidism, unspecified: Secondary | ICD-10-CM | POA: Diagnosis present

## 2012-01-28 DIAGNOSIS — Z7901 Long term (current) use of anticoagulants: Secondary | ICD-10-CM | POA: Diagnosis not present

## 2012-01-28 DIAGNOSIS — M79671 Pain in right foot: Secondary | ICD-10-CM

## 2012-01-28 DIAGNOSIS — R791 Abnormal coagulation profile: Secondary | ICD-10-CM

## 2012-01-28 DIAGNOSIS — I472 Ventricular tachycardia, unspecified: Secondary | ICD-10-CM | POA: Diagnosis not present

## 2012-01-28 DIAGNOSIS — N390 Urinary tract infection, site not specified: Secondary | ICD-10-CM

## 2012-01-28 DIAGNOSIS — M79609 Pain in unspecified limb: Secondary | ICD-10-CM

## 2012-01-28 DIAGNOSIS — Z951 Presence of aortocoronary bypass graft: Secondary | ICD-10-CM

## 2012-01-28 DIAGNOSIS — I890 Lymphedema, not elsewhere classified: Secondary | ICD-10-CM

## 2012-01-28 DIAGNOSIS — I447 Left bundle-branch block, unspecified: Secondary | ICD-10-CM

## 2012-01-28 DIAGNOSIS — M25539 Pain in unspecified wrist: Secondary | ICD-10-CM | POA: Diagnosis not present

## 2012-01-28 DIAGNOSIS — M7989 Other specified soft tissue disorders: Secondary | ICD-10-CM | POA: Diagnosis not present

## 2012-01-28 DIAGNOSIS — I5042 Chronic combined systolic (congestive) and diastolic (congestive) heart failure: Secondary | ICD-10-CM

## 2012-01-28 DIAGNOSIS — L0390 Cellulitis, unspecified: Secondary | ICD-10-CM

## 2012-01-28 DIAGNOSIS — I238 Other current complications following acute myocardial infarction: Secondary | ICD-10-CM

## 2012-01-28 DIAGNOSIS — C8590 Non-Hodgkin lymphoma, unspecified, unspecified site: Secondary | ICD-10-CM

## 2012-01-28 DIAGNOSIS — Z8673 Personal history of transient ischemic attack (TIA), and cerebral infarction without residual deficits: Secondary | ICD-10-CM | POA: Diagnosis present

## 2012-01-28 DIAGNOSIS — J9819 Other pulmonary collapse: Secondary | ICD-10-CM | POA: Diagnosis not present

## 2012-01-28 DIAGNOSIS — E11319 Type 2 diabetes mellitus with unspecified diabetic retinopathy without macular edema: Secondary | ICD-10-CM

## 2012-01-28 DIAGNOSIS — I480 Paroxysmal atrial fibrillation: Secondary | ICD-10-CM | POA: Diagnosis present

## 2012-01-28 DIAGNOSIS — J90 Pleural effusion, not elsewhere classified: Secondary | ICD-10-CM | POA: Diagnosis not present

## 2012-01-28 DIAGNOSIS — M109 Gout, unspecified: Secondary | ICD-10-CM

## 2012-01-28 DIAGNOSIS — Z794 Long term (current) use of insulin: Secondary | ICD-10-CM | POA: Diagnosis not present

## 2012-01-28 DIAGNOSIS — R0600 Dyspnea, unspecified: Secondary | ICD-10-CM

## 2012-01-28 DIAGNOSIS — R6889 Other general symptoms and signs: Secondary | ICD-10-CM

## 2012-01-28 DIAGNOSIS — I252 Old myocardial infarction: Secondary | ICD-10-CM | POA: Diagnosis not present

## 2012-01-28 DIAGNOSIS — G629 Polyneuropathy, unspecified: Secondary | ICD-10-CM

## 2012-01-28 DIAGNOSIS — S8000XA Contusion of unspecified knee, initial encounter: Secondary | ICD-10-CM

## 2012-01-28 DIAGNOSIS — I6350 Cerebral infarction due to unspecified occlusion or stenosis of unspecified cerebral artery: Secondary | ICD-10-CM

## 2012-01-28 DIAGNOSIS — J189 Pneumonia, unspecified organism: Secondary | ICD-10-CM

## 2012-01-28 DIAGNOSIS — C8589 Other specified types of non-Hodgkin lymphoma, extranodal and solid organ sites: Secondary | ICD-10-CM

## 2012-01-28 DIAGNOSIS — I129 Hypertensive chronic kidney disease with stage 1 through stage 4 chronic kidney disease, or unspecified chronic kidney disease: Secondary | ICD-10-CM | POA: Diagnosis present

## 2012-01-28 DIAGNOSIS — R0989 Other specified symptoms and signs involving the circulatory and respiratory systems: Secondary | ICD-10-CM

## 2012-01-28 DIAGNOSIS — I501 Left ventricular failure: Secondary | ICD-10-CM

## 2012-01-28 DIAGNOSIS — N183 Chronic kidney disease, stage 3 unspecified: Secondary | ICD-10-CM | POA: Diagnosis not present

## 2012-01-28 DIAGNOSIS — Z95810 Presence of automatic (implantable) cardiac defibrillator: Secondary | ICD-10-CM

## 2012-01-28 DIAGNOSIS — I10 Essential (primary) hypertension: Secondary | ICD-10-CM

## 2012-01-28 DIAGNOSIS — R0609 Other forms of dyspnea: Secondary | ICD-10-CM

## 2012-01-28 DIAGNOSIS — G909 Disorder of the autonomic nervous system, unspecified: Secondary | ICD-10-CM | POA: Diagnosis present

## 2012-01-28 DIAGNOSIS — I2589 Other forms of chronic ischemic heart disease: Secondary | ICD-10-CM | POA: Diagnosis present

## 2012-01-28 DIAGNOSIS — R0902 Hypoxemia: Secondary | ICD-10-CM

## 2012-01-28 HISTORY — DX: Intracardiac thrombosis, not elsewhere classified: I51.3

## 2012-01-28 HISTORY — DX: Type 2 diabetes mellitus with unspecified complications: E11.8

## 2012-01-28 HISTORY — DX: Morbid (severe) obesity due to excess calories: E66.01

## 2012-01-28 HISTORY — DX: Paroxysmal atrial fibrillation: I48.0

## 2012-01-28 HISTORY — DX: Body Mass Index (BMI) 40.0 and over, adult: Z684

## 2012-01-28 LAB — URINALYSIS, ROUTINE W REFLEX MICROSCOPIC
Bilirubin Urine: NEGATIVE
Glucose, UA: NEGATIVE mg/dL
Ketones, ur: NEGATIVE mg/dL
Nitrite: NEGATIVE
Specific Gravity, Urine: 1.016 (ref 1.005–1.030)
pH: 5.5 (ref 5.0–8.0)

## 2012-01-28 LAB — COMPREHENSIVE METABOLIC PANEL
ALT: 15 U/L (ref 0–35)
AST: 15 U/L (ref 0–37)
Albumin: 3.1 g/dL — ABNORMAL LOW (ref 3.5–5.2)
Alkaline Phosphatase: 62 U/L (ref 39–117)
CO2: 28 mEq/L (ref 19–32)
Chloride: 98 mEq/L (ref 96–112)
GFR calc non Af Amer: 32 mL/min — ABNORMAL LOW (ref >90)
Potassium: 4.1 mEq/L (ref 3.5–5.1)
Sodium: 136 mEq/L (ref 135–145)
Total Bilirubin: 0.4 mg/dL (ref 0.3–1.2)

## 2012-01-28 LAB — GLUCOSE, CAPILLARY
Glucose-Capillary: 155 mg/dL — ABNORMAL HIGH (ref 70–99)
Glucose-Capillary: 190 mg/dL — ABNORMAL HIGH (ref 70–99)
Glucose-Capillary: 173 mg/dL — ABNORMAL HIGH (ref 70–99)

## 2012-01-28 LAB — CBC
MCHC: 33.4 g/dL (ref 30.0–36.0)
MCV: 92.7 fL (ref 78.0–100.0)
Platelets: 171 10*3/uL (ref 150–400)
RDW: 14.8 % (ref 11.5–15.5)
WBC: 11.5 10*3/uL — ABNORMAL HIGH (ref 4.0–10.5)

## 2012-01-28 LAB — CBC WITH DIFFERENTIAL/PLATELET
Basophils Absolute: 0 10*3/uL (ref 0.0–0.1)
Basophils Relative: 0 % (ref 0–1)
HCT: 32.4 % — ABNORMAL LOW (ref 36.0–46.0)
Lymphocytes Relative: 15 % (ref 12–46)
MCHC: 33 g/dL (ref 30.0–36.0)
Monocytes Absolute: 1.1 10*3/uL — ABNORMAL HIGH (ref 0.1–1.0)
Neutro Abs: 8.7 10*3/uL — ABNORMAL HIGH (ref 1.7–7.7)
Neutrophils Relative %: 74 % (ref 43–77)
RDW: 14.9 % (ref 11.5–15.5)
WBC: 11.8 10*3/uL — ABNORMAL HIGH (ref 4.0–10.5)

## 2012-01-28 LAB — URINE MICROSCOPIC-ADD ON

## 2012-01-28 LAB — BASIC METABOLIC PANEL
Calcium: 9.2 mg/dL (ref 8.4–10.5)
Creatinine, Ser: 1.56 mg/dL — ABNORMAL HIGH (ref 0.50–1.10)
GFR calc Af Amer: 38 mL/min — ABNORMAL LOW (ref >90)

## 2012-01-28 LAB — PROTIME-INR: Prothrombin Time: 22.9 seconds — ABNORMAL HIGH (ref 11.6–15.2)

## 2012-01-28 LAB — PRO B NATRIURETIC PEPTIDE: Pro B Natriuretic peptide (BNP): 2551 pg/mL — ABNORMAL HIGH (ref 0–125)

## 2012-01-28 MED ORDER — MUPIROCIN 2 % EX OINT
TOPICAL_OINTMENT | Freq: Two times a day (BID) | CUTANEOUS | Status: DC
  Administered 2012-01-28 – 2012-01-30 (×4): via NASAL
  Administered 2012-01-30: 1 via NASAL
  Administered 2012-01-31: 09:00:00 via NASAL
  Filled 2012-01-28: qty 22

## 2012-01-28 MED ORDER — FUROSEMIDE 10 MG/ML IJ SOLN
40.0000 mg | Freq: Once | INTRAMUSCULAR | Status: AC
  Administered 2012-01-28: 40 mg via INTRAVENOUS
  Filled 2012-01-28: qty 4

## 2012-01-28 MED ORDER — INSULIN ASPART 100 UNIT/ML ~~LOC~~ SOLN
0.0000 [IU] | Freq: Every day | SUBCUTANEOUS | Status: DC
  Administered 2012-01-29: 2 [IU] via SUBCUTANEOUS

## 2012-01-28 MED ORDER — DEXTROSE 5 % IV SOLN: 1.0000 g | INTRAVENOUS | Status: DC

## 2012-01-28 MED ORDER — BIOTENE DRY MOUTH MT LIQD
15.0000 mL | Freq: Two times a day (BID) | OROMUCOSAL | Status: DC
  Administered 2012-01-28 – 2012-01-29 (×3): 15 mL via OROMUCOSAL

## 2012-01-28 MED ORDER — SODIUM CHLORIDE 0.9 % IJ SOLN
3.0000 mL | Freq: Two times a day (BID) | INTRAMUSCULAR | Status: DC
  Administered 2012-01-28 – 2012-01-31 (×6): 3 mL via INTRAVENOUS

## 2012-01-28 MED ORDER — INFLUENZA VIRUS VACC SPLIT PF IM SUSP
0.5000 mL | INTRAMUSCULAR | Status: AC
  Administered 2012-01-29: 0.5 mL via INTRAMUSCULAR
  Filled 2012-01-28: qty 0.5

## 2012-01-28 MED ORDER — SODIUM CHLORIDE 0.9 % IJ SOLN: 3.0000 mL | INTRAMUSCULAR | Status: DC | PRN

## 2012-01-28 MED ORDER — INSULIN NPH (HUMAN) (ISOPHANE) 100 UNIT/ML ~~LOC~~ SUSP
40.0000 [IU] | Freq: Every day | SUBCUTANEOUS | Status: DC
  Administered 2012-01-28 – 2012-01-30 (×3): 40 [IU] via SUBCUTANEOUS

## 2012-01-28 MED ORDER — WARFARIN SODIUM 7.5 MG PO TABS: 7.5000 mg | ORAL_TABLET | ORAL | Status: DC

## 2012-01-28 MED ORDER — AMLODIPINE BESYLATE 5 MG PO TABS
5.0000 mg | ORAL_TABLET | Freq: Every day | ORAL | Status: DC
  Administered 2012-01-28 – 2012-01-31 (×4): 5 mg via ORAL
  Filled 2012-01-28 (×4): qty 1

## 2012-01-28 MED ORDER — INSULIN ASPART 100 UNIT/ML ~~LOC~~ SOLN: 0.0000 [IU] | Freq: Three times a day (TID) | SUBCUTANEOUS | Status: DC

## 2012-01-28 MED ORDER — ALLOPURINOL 100 MG PO TABS
100.0000 mg | ORAL_TABLET | Freq: Two times a day (BID) | ORAL | Status: DC
  Administered 2012-01-28 – 2012-01-31 (×7): 100 mg via ORAL
  Filled 2012-01-28 (×8): qty 1

## 2012-01-28 MED ORDER — ACETAMINOPHEN 325 MG PO TABS
650.0000 mg | ORAL_TABLET | Freq: Once | ORAL | Status: AC
  Administered 2012-01-28: 650 mg via ORAL
  Filled 2012-01-28: qty 2

## 2012-01-28 MED ORDER — WARFARIN SODIUM 5 MG PO TABS: 5.0000 mg | ORAL_TABLET | ORAL | Status: DC

## 2012-01-28 MED ORDER — WARFARIN SODIUM 5 MG PO TABS
5.0000 mg | ORAL_TABLET | ORAL | Status: DC
  Administered 2012-01-28: 5 mg via ORAL
  Filled 2012-01-28 (×3): qty 1

## 2012-01-28 MED ORDER — LEVOTHYROXINE SODIUM 25 MCG PO TABS
25.0000 ug | ORAL_TABLET | Freq: Every day | ORAL | Status: DC
  Administered 2012-01-28 – 2012-01-31 (×4): 25 ug via ORAL
  Filled 2012-01-28 (×5): qty 1

## 2012-01-28 MED ORDER — CITALOPRAM HYDROBROMIDE 10 MG PO TABS
20.0000 mg | ORAL_TABLET | Freq: Every day | ORAL | Status: DC
  Administered 2012-01-28 – 2012-01-31 (×4): 20 mg via ORAL
  Filled 2012-01-28 (×4): qty 2

## 2012-01-28 MED ORDER — ENALAPRIL MALEATE 10 MG PO TABS
10.0000 mg | ORAL_TABLET | Freq: Every day | ORAL | Status: DC
  Administered 2012-01-28 – 2012-01-31 (×4): 10 mg via ORAL
  Filled 2012-01-28 (×4): qty 1

## 2012-01-28 MED ORDER — SODIUM CHLORIDE 0.9 % IJ SOLN
3.0000 mL | Freq: Two times a day (BID) | INTRAMUSCULAR | Status: DC
  Administered 2012-01-28 – 2012-01-30 (×2): 3 mL via INTRAVENOUS

## 2012-01-28 MED ORDER — INSULIN ASPART 100 UNIT/ML ~~LOC~~ SOLN
0.0000 [IU] | Freq: Three times a day (TID) | SUBCUTANEOUS | Status: DC
  Administered 2012-01-28 – 2012-01-30 (×6): 3 [IU] via SUBCUTANEOUS
  Administered 2012-01-30: 2 [IU] via SUBCUTANEOUS
  Administered 2012-01-30 – 2012-01-31 (×2): 3 [IU] via SUBCUTANEOUS

## 2012-01-28 MED ORDER — DEXTROSE 5 % IV SOLN
1.0000 g | INTRAVENOUS | Status: DC
  Administered 2012-01-28 – 2012-01-29 (×2): 1 g via INTRAVENOUS
  Filled 2012-01-28 (×3): qty 10

## 2012-01-28 MED ORDER — SODIUM CHLORIDE 0.9 % IV SOLN: 250.0000 mL | INTRAVENOUS | Status: DC | PRN

## 2012-01-28 MED ORDER — VANCOMYCIN HCL 1000 MG IV SOLR
1500.0000 mg | INTRAVENOUS | Status: DC
  Administered 2012-01-29 – 2012-01-30 (×2): 1500 mg via INTRAVENOUS
  Filled 2012-01-28 (×2): qty 1500

## 2012-01-28 MED ORDER — INSULIN NPH (HUMAN) (ISOPHANE) 100 UNIT/ML ~~LOC~~ SUSP: 40.0000 [IU] | Freq: Two times a day (BID) | SUBCUTANEOUS | Status: DC

## 2012-01-28 MED ORDER — CARVEDILOL 25 MG PO TABS
25.0000 mg | ORAL_TABLET | Freq: Two times a day (BID) | ORAL | Status: DC
  Administered 2012-01-28 – 2012-01-31 (×7): 25 mg via ORAL
  Filled 2012-01-28 (×11): qty 1

## 2012-01-28 MED ORDER — ASPIRIN EC 81 MG PO TBEC
81.0000 mg | DELAYED_RELEASE_TABLET | Freq: Every day | ORAL | Status: DC
  Administered 2012-01-28 – 2012-01-31 (×4): 81 mg via ORAL
  Filled 2012-01-28 (×4): qty 1

## 2012-01-28 MED ORDER — TETANUS-DIPHTHERIA TOXOIDS TD 5-2 LFU IM INJ
0.5000 mL | INJECTION | Freq: Once | INTRAMUSCULAR | Status: AC
  Administered 2012-01-28: 0.5 mL via INTRAMUSCULAR
  Filled 2012-01-28: qty 0.5

## 2012-01-28 MED ORDER — LEVOFLOXACIN IN D5W 750 MG/150ML IV SOLN
750.0000 mg | Freq: Once | INTRAVENOUS | Status: AC
  Administered 2012-01-28: 750 mg via INTRAVENOUS
  Filled 2012-01-28 (×2): qty 150

## 2012-01-28 MED ORDER — LORAZEPAM 0.5 MG PO TABS
0.5000 mg | ORAL_TABLET | Freq: Every day | ORAL | Status: DC
  Administered 2012-01-28 – 2012-01-30 (×3): 0.5 mg via ORAL
  Filled 2012-01-28 (×3): qty 1

## 2012-01-28 MED ORDER — GABAPENTIN 100 MG PO CAPS
100.0000 mg | ORAL_CAPSULE | Freq: Two times a day (BID) | ORAL | Status: DC
  Administered 2012-01-28 – 2012-01-31 (×7): 100 mg via ORAL
  Filled 2012-01-28 (×8): qty 1

## 2012-01-28 MED ORDER — WARFARIN - PHARMACIST DOSING INPATIENT: Freq: Every day | Status: DC

## 2012-01-28 MED ORDER — VANCOMYCIN HCL 1000 MG IV SOLR
2000.0000 mg | Freq: Once | INTRAVENOUS | Status: AC
  Administered 2012-01-28: 2000 mg via INTRAVENOUS
  Filled 2012-01-28: qty 2000

## 2012-01-28 MED ORDER — INSULIN NPH (HUMAN) (ISOPHANE) 100 UNIT/ML ~~LOC~~ SUSP
50.0000 [IU] | Freq: Every day | SUBCUTANEOUS | Status: DC
  Administered 2012-01-28 – 2012-01-31 (×4): 50 [IU] via SUBCUTANEOUS
  Filled 2012-01-28: qty 10

## 2012-01-28 MED ORDER — FUROSEMIDE 10 MG/ML IJ SOLN
40.0000 mg | Freq: Two times a day (BID) | INTRAMUSCULAR | Status: DC
  Administered 2012-01-28 – 2012-01-29 (×3): 40 mg via INTRAVENOUS
  Filled 2012-01-28 (×4): qty 4

## 2012-01-28 NOTE — H&P (Signed)
PCP:   Letitia Libra, Ala Dach, MD [facility]  Chief Complaint:  Shortness of breath  HPI: This is a 71 year old female who is morbidly obese with history of cardiomyopathy and congestive heart failure, chronic respiratory failure. She fell last Tuesday and the lacerated her right knee. She believes her knee is are infected. she's been having fevers, chills and some are draining from the right knee. Today she developed some difficulty breathing. Her oxygen which she uses mainly at night with her CPAP she started using the daytime. She has some wheezing. She denies any cough, nausea, vomiting, burning or urination, altered mental status or chest pains. She came to the ER.   Review of Systems:  The patient denies anorexia, fever, weight loss,, vision loss, decreased hearing, hoarseness, chest pain, syncope, peripheral edema, balance deficits, hemoptysis, abdominal pain, melena, hematochezia, severe indigestion/heartburn, hematuria, incontinence, genital sores, muscle weakness, suspicious skin lesions, transient blindness, difficulty walking, depression, unusual weight change, abnormal bleeding, enlarged lymph nodes, angioedema, and breast masses.  Past Medical History: Past Medical History  Diagnosis Date  . LBBB (left bundle branch block)   . Carotid bruit   . Dyslipidemia   . Hypertension   . CHF (congestive heart failure)     Diastolic heart failure but with history of variable ejection fractions most recent ejection fraction normal mild LVH June 2012 status post recent hospitalization in June for heart failure with volume overload  . Back pain, chronic     "just when I walk; mass on 3rd and 4th vertebrae right lower back"  . Cardiomyopathy, ischemic     Status post coronary bypass grafting no recurrent chest pain  . Cough   . History of recurrent UTIs   . Cystitis, acute   . Rhinosinusitis   . Dysuria   . 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 CRI  (chronic renal insufficiency)   . Biventricular implantable cardiac defibrillator in situ     Status post device revision secondary to end of life  . Hypoxemia requiring supplemental oxygen   . Candidiasis of vulva and vagina   . Hypothyroidism   . ICD (implantable cardiac defibrillator) in place   . Lymphoma, non Hodgkin's     In remission  . 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 Truett Perna  . Old MI (myocardial infarction)     "I was never treated for it"  . Pneumonia 12/27/11    "3 times; it's been a long time ago"  . OSA on CPAP   . On home oxygen therapy 12/27/2011    "w/CPAP at night"  . Exertional dyspnea 12/27/2011  . Stroke 12/27/2011    pt denies this history  . DDD (degenerative disc disease)     multilevel  . Gout attack ~ 08/2011   Past Surgical History  Procedure Date  . Cardiac catheterization 06/03/01  . Cholecystectomy 1970  . Tubal ligation 1972  . Abdominal hysterectomy 1982  . Insert / replace / remove pacemaker 2007; 2012    w/AICD  . Coronary artery bypass graft 2003    CABG X4    Medications: Prior to Admission medications   Medication Sig Start Date End Date Taking? Authorizing Provider  allopurinol (ZYLOPRIM) 100 MG tablet Take 1 tablet (100 mg total) by mouth 2 (two) times daily. 12/13/11 12/12/12 Yes Sandford Craze, NP  amLODipine (NORVASC) 5 MG tablet Take 1 tablet (5 mg total) by mouth daily.  11/23/11  Yes Edwyna Perfect, MD  aspirin EC 81 MG tablet Take 81 mg by mouth daily.   Yes Historical Provider, MD  Calcium 600-200 MG-UNIT per tablet Take 1 tablet by mouth daily.     Yes Historical Provider, MD  carvedilol (COREG) 25 MG tablet Take 2 tablets by mouth twice a day 12/26/11  Yes Edwyna Perfect, MD  Cholecalciferol (VITAMIN D) 1000 UNITS capsule Take 1,000 Units by mouth daily.     Yes Historical Provider, MD  citalopram (CELEXA) 20 MG tablet Take 1 tablet (20 mg total) by mouth daily. 11/12/11  Yes Edwyna Perfect, MD  enalapril (VASOTEC) 10 MG tablet Take 1 tablet (10 mg total) by mouth daily. 12/26/11  Yes Edwyna Perfect, MD  gabapentin (NEURONTIN) 100 MG capsule Take 1 capsule (100 mg total) by mouth 2 (two) times daily. 12/26/11  Yes Edwyna Perfect, MD  insulin NPH (HUMULIN N,NOVOLIN N) 100 UNIT/ML injection Inject 40-50 Units into the skin 2 (two) times daily. Inject 50 units every morning and inject 40 units every night.   Yes Historical Provider, MD  insulin regular (NOVOLIN R,HUMULIN R) 100 units/mL injection Inject 20-30 Units into the skin 2 (two) times daily before a meal. Inject 30 units every morning and inject 20 units every night.   Yes Historical Provider, MD  levothyroxine (SYNTHROID, LEVOTHROID) 25 MCG tablet Take 1 tablet (25 mcg total) by mouth daily. 01/15/12  Yes Edwyna Perfect, MD  LORazepam (ATIVAN) 0.5 MG tablet Take 1 tablet (0.5 mg total) by mouth at bedtime. 10/01/11  Yes Edwyna Perfect, MD  Multiple Vitamins-Minerals (MULTIVITAMIN WITH MINERALS) tablet Take 1 tablet by mouth daily.     Yes Historical Provider, MD  Omega-3 Fatty Acids (FISH OIL) 1200 MG CAPS Take 2 capsules by mouth 2 (two) times daily.     Yes Historical Provider, MD  rosuvastatin (CRESTOR) 10 MG tablet Take 1 tablet (10 mg total) by mouth daily. 04/03/11  Yes Edwyna Perfect, MD  torsemide (DEMADEX) 20 MG tablet Take 30 mg by mouth 2 (two) times daily.    Yes Historical Provider, MD  warfarin (COUMADIN) 5 MG tablet Take 5-7.5 mg by mouth See admin instructions. Takes 1.5 tablets (7.5mg ) on Thursday and Sunday. All other days takes 5mg .   Yes Historical Provider, MD    Allergies:   Allergies  Allergen Reactions  . Sulfonamide Derivatives Other (See Comments)    Unknown; "childhood allergy/mother"    Social History:  reports that she has never smoked. She has never used smokeless tobacco. She reports that she does not drink alcohol or use illicit drugs. lives at home with her husband, uses a scooter,  has a CPAP and oxygen at night   Family History: Family History  Problem Relation Age of Onset  . Cancer Mother     kidney and female repo  . Heart disease Father   . Asthma Mother     Physical Exam: Filed Vitals:   01/28/12 0131 01/28/12 0224 01/28/12 0245 01/28/12 0300  BP: 132/43 130/58 133/45 138/45  Pulse: 66 78 64 78  Temp:      TempSrc:      Resp: 22 18 20 21   SpO2: 96% 98% 94% 95%    General:  Alert and oriented times three, morbidly obese, no acute distress Eyes: PERRLA, pink conjunctiva, no scleral icterus ENT: Moist oral mucosa, neck supple, no thyromegaly Lungs: clear to ascultation, no wheeze, no crackles, no use  of accessory muscles, poor exam due to patient's body habitus  Cardiovascular: regular rate and rhythm, no regurgitation, no gallops, no murmurs. No carotid bruits, no JVD Abdomen: soft, positive BS, non-tender, non-distended, no organomegaly, not an acute abdomen GU: not examined Neuro: CN II - XII grossly intact, sensation intact Musculoskeletal: strength 5/5 all extremities, no clubbing, cyanosis or edema Skin: no rash, no subcutaneous crepitation, no decubitus, bruising right knee, no fluctuance, eschar, bruising around knee cap and down the right leg Psych: appropriate patient   Labs on Admission:   [facility] 01/28/12 0017  NA 136  K 4.1  CL 98  CO2 28  GLUCOSE 178*  BUN 42*  CREATININE 1.58*  CALCIUM 9.3  MG --  PHOS --    Basename 01/28/12 0017  AST 15  ALT 15  ALKPHOS 62  BILITOT 0.4  PROT 6.3  ALBUMIN 3.1*   No results found for this basename: LIPASE:2,AMYLASE:2 in the last 72 hours  Basename 01/28/12 0322 01/28/12 0017  WBC 11.5* 11.8*  NEUTROABS -- 8.7*  HGB 10.6* 10.7*  HCT 31.7* 32.4*  MCV 92.7 92.8  PLT 171 171    Basename 01/28/12 0017  CKTOTAL --  CKMB --  CKMBINDEX --  TROPONINI <0.30  Results for Wedge, Destiny [name] (MRN [id number]) as of 01/28/2012 04:18  Ref. Range 01/28/2012 00:17  Pro B Natriuretic  peptide (BNP) Latest Range: 0-125 pg/mL 2551.0 (H)  Results for Kochel, Destiny [name] (MRN [id number]) as of 01/28/2012 04:18  Ref. Range 01/28/2012 03:05  Prothrombin Time Latest Range: 11.6-15.2 seconds 22.9 (H)  INR Latest Range: 0.00-1.49  1.99 (H)   No components found with this basename: POCBNP:3 No results found for this basename: DDIMER:2 in the last 72 hours No results found for this basename: HGBA1C:2 in the last 72 hours No results found for this basename: CHOL:2,HDL:2,LDLCALC:2,TRIG:2,CHOLHDL:2,LDLDIRECT:2 in the last 72 hours No results found for this basename: TSH,T4TOTAL,FREET3,T3FREE,THYROIDAB in the last 72 hours No results found for this basename: VITAMINB12:2,FOLATE:2,FERRITIN:2,TIBC:2,IRON:2,RETICCTPCT:2 in the last 72 hours  Micro Results: Ordered and pending   Radiological Exams on Admission: Dg Chest 2 View  01/28/2012  *RADIOLOGY REPORT*  Clinical Data: Shortness of breath with fever.  Hypertension. Diabetes.  CHEST - 2 VIEW  Comparison: 12/27/2011  Findings: Cardiomegaly.  Dual lead pacer. Mild vascular congestion. No focal infiltrates. Mild bibasilar subsegmental atelectasis with low lung volumes.  Prior CABG.  Small right effusion.  No pneumothorax.  Osteopenia.  Slight worsening aeration compared with priors.  IMPRESSION: Cardiomegaly with mild vascular congestion.  Slight worsening aeration.   Original Report Authenticated By: Elsie Stain, M.D.    Dg Wrist Complete Left  01/28/2012  *RADIOLOGY REPORT*  Clinical Data: Status post fall 6 days ago.  Pain.  LEFT WRIST - COMPLETE 3+ VIEW  Comparison: None.  Findings: No acute bony or joint abnormality is identified. 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 There is degenerative disease at the base of the thumb and about the radiocarpal joint.  IMPRESSION: No acute abnormality.   Original Report Authenticated By: Bernadene Bell. D'ALESSIO, M.D.    Dg Knee Complete 4 Views Right  01/28/2012  *RADIOLOGY  REPORT*  Clinical Data: Status post fall 6 days ago.  Pain.  RIGHT KNEE - COMPLETE 4+ VIEW  Comparison: None.  Findings: There is marked soft tissue swelling anterior to the patella without underlying fracture or dislocation.  No joint effusion.  Vascular clips in the medial soft tissues are noted. Joint spaces are preserved.  IMPRESSION:  Soft tissue swelling anterior to the patella consistent with contusion/hemorrhage into the patellar bursa.  Negative for fracture.   Original Report Authenticated By: Bernadene Bell. Maricela Curet, M.D.     Assessment/Plan Present on Admission:  .CHF, acute diastolic  Admit  to telemetry Lasix IV Check BMP in a.m. EF of 60%   Cellulitis right knee/bruising/status post fall IV antibiotics  .NON-HODGKIN'S LYMPHOMA .Unspecified hypothyroidism .DIABETIC  RETINOPATHY .DIABETES MELLITUS, WITH AUTONOMIC NEUROPATHY .DYSLIPIDEMIA .HYPERTENSION .CVA .LYMPHEDEMA .ICD - IN SITU .OSA (obstructive sleep apnea) .Cardiomyopathy, ischemic .CKD (chronic kidney disease) stage 3, GFR 30-59 ml/min  stable resume home medication ADA diet and sliding scale insulin CPAP machine with oxygen at night   Full code No need for DVT prophylaxis  Jada Fass 01/28/2012, 4:19 AM

## 2012-01-28 NOTE — ED Notes (Signed)
The pt is c/o sob  With a temp today.  She is also c/o rt knee and  Lt wrist pain from a fall last Tuesday..  She has been on 02 since this am

## 2012-01-28 NOTE — ED Provider Notes (Signed)
History     CSN: [id number]  Arrival date & time 01/27/12  2357   First MD Initiated Contact with Patient 01/28/12 0136      Chief Complaint  Patient presents with  . 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  She presents today with complaints of increasingly severe shortness of breath and dyspnea on exertion for the past 24 hours. She has chronic orthopnea but, says that this is worsened. She has also noticed an increase in bilateral lower extremity edema. She denies cough as well as chest pain. She does note that she had a temperature of 100.7 this afternoon at home. She denies any abdominal pain, nausea, vomiting, diarrhea or genitourinary symptoms.  The patient reports compliance with all medications. Of note, her dose of torsemide had been decreased from 30 mg twice a day to 20 mg twice a day and she took this dose for about one week. After about one week, which was 8 days ago, she felt like her lower extremity edema was worsening. So, she switched back to her previous dose of 30 mg twice a day.  Past Medical History  Diagnosis Date  . LBBB (left bundle branch block)   . Carotid bruit   . Dyslipidemia   . Hypertension   . CHF (congestive heart failure)     Diastolic heart failure but with history of variable ejection fractions most recent ejection fraction normal mild LVH June 2012 status post recent hospitalization in June for heart failure with volume overload  . Back pain, chronic     "just when I walk; mass on 3rd and 4th vertebrae right lower back"  . Cardiomyopathy, ischemic     Status post coronary bypass grafting no recurrent chest pain  . Cough   .  History of recurrent UTIs   . Cystitis, acute   . Rhinosinusitis   . Dysuria   . 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 CRI (chronic renal insufficiency)   . Biventricular implantable cardiac defibrillator in situ     Status post device revision secondary to end of life  . Hypoxemia requiring supplemental oxygen   . Candidiasis of vulva and vagina   . Hypothyroidism   . ICD (implantable cardiac defibrillator) in place   . Lymphoma, non Hodgkin's     In remission  . 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 Truett Perna  . Old MI (myocardial infarction)     "I was never treated for it"  . Pneumonia 12/27/11    "3 times; it's been a long time ago"  . OSA on CPAP   . On home oxygen therapy 12/27/2011    "w/CPAP at night"  . Exertional dyspnea 12/27/2011  . Stroke 12/27/2011    pt denies this history  . DDD (degenerative disc disease)     multilevel  . Gout attack ~ 08/2011    Past Surgical History  Procedure Date  . Cardiac catheterization 06/03/01  . Cholecystectomy 1970  . Tubal ligation 1972  . Abdominal hysterectomy 1982  . Insert / replace / remove pacemaker 2007; 2012    w/AICD  . Coronary artery bypass graft 2003    CABG X4    Family History  Problem  Relation Age of Onset  . Cancer Mother     kidney and female repo  . Heart disease Father   . Asthma Mother     History  Substance Use Topics  . Smoking status: Never Smoker   . Smokeless tobacco: Never Used  . Alcohol Use: No    OB History    Grav Para Term Preterm Abortions TAB SAB Ect Mult Living                  Review of Systems  Gen: As per history of present illness, no weight loss, , chills, night sweats Eyes: no discharge or drainage, no occular pain or visual changes Nose: no epistaxis or rhinorrhea Mouth: no dental pain, no sore throat Neck: no neck pain Lungs: As per history of present illness, otherwise negative CV: As per  history of present illness, no chest pain or palpitations. Abd: no abdominal pain, nausea, vomiting GU: no dysuria or gross hematuria MSK: patient complains of pain in the right knee and left wrist since fall with blunt trauma to these areas which occurred last week after a mechanical fall.  Neuro: no headache, no focal neurologic deficits Skin: no rash Psyche: negative.  Allergies  Sulfonamide derivatives  Home Medications   Current Outpatient Rx  Name Route Sig Dispense Refill  . ALLOPURINOL 100 MG PO TABS Oral Take 1 tablet (100 mg total) by mouth 2 (two) times daily. 60 tablet 3  . AMLODIPINE BESYLATE 5 MG PO TABS Oral Take 1 tablet (5 mg total) by mouth daily. 30 tablet 3  . ASPIRIN EC 81 MG PO TBEC Oral Take 81 mg by mouth daily.    Marland Kitchen CALCIUM 600-200 MG-UNIT PO TABS Oral Take 1 tablet by mouth daily.      Marland Kitchen CARVEDILOL 25 MG PO TABS  Take 2 tablets by mouth twice a day 120 tablet 5    Quantity and direction correction 08/23/2011.  Marland Kitchen VITAMIN D 1000 UNITS PO CAPS Oral Take 1,000 Units by mouth daily.      Marland Kitchen CITALOPRAM HYDROBROMIDE 20 MG PO TABS Oral Take 1 tablet (20 mg total) by mouth daily. 30 tablet 6  . ENALAPRIL MALEATE 10 MG PO TABS Oral Take 1 tablet (10 mg total) by mouth daily. 30 tablet 5  . GABAPENTIN 100 MG PO CAPS Oral Take 1 capsule (100 mg total) by mouth 2 (two) times daily. 60 capsule 5  . INSULIN ISOPHANE HUMAN 100 UNIT/ML [place] SUSP Subcutaneous Inject 40-50 Units into the skin 2 (two) times daily. Inject 50 units every morning and inject 40 units every night.    . INSULIN REGULAR HUMAN 100 UNIT/ML IJ SOLN Subcutaneous Inject 20-30 Units into the skin 2 (two) times daily before a meal. Inject 30 units every morning and inject 20 units every night.    Marland Kitchen LEVOTHYROXINE SODIUM 25 MCG PO TABS Oral Take 1 tablet (25 mcg total) by mouth daily. 90 tablet 1  . LORAZEPAM 0.5 MG PO TABS Oral Take 1 tablet (0.5 mg total) by mouth at bedtime. 90 tablet 1  . MULTI-VITAMIN/MINERALS PO  TABS Oral Take 1 tablet by mouth daily.      Marland Kitchen FISH OIL 1200 MG PO CAPS Oral Take 2 capsules by mouth 2 (two) times daily.      Marland Kitchen ROSUVASTATIN CALCIUM 10 MG PO TABS Oral Take 1 tablet (10 mg total) by mouth daily. 30 tablet 6  . TORSEMIDE 20 MG PO TABS Oral Take 30 mg by  mouth 2 (two) times daily.     . WARFARIN SODIUM 5 MG PO TABS Oral Take 5-7.5 mg by mouth See admin instructions. Takes 1.5 tablets (7.5mg ) on Thursday and Sunday. All other days takes 5mg .      BP 138/45  Pulse 78  Temp 100.1 F (37.8 C) (Oral)  Resp 21  SpO2 95%  Physical Exam  Gen: acutely ill appearing, pale, on 2L 02 via [place], RR 24/min, sitting upright in gurney Head: NCAT Eyes: PERL, EOMI Nose: no epistaixis or rhinorrhea Mouth/throat: mucosa is moist and pink Neck: supple, no stridor, no jvd appreciated Lungs: BS diminished bilaterally, faint rales both bases.  CV: RRR, no murmur appreciated, no jvd appreciated, 1+ symmetric pretibial edema. Abd: morbidly obese, soft, notender, nondistended Back: no ttp, no cva ttp Skin: abrasion to anterior right knee, o/w wnl Neuro: CN ii-xii grossly intact, no focal deficits Ext: abrasion with soft tissue swelling and ttp over the anterior right knee, able range range without any pain, no instability detected, no increased warmth or erythema appreciated in comparison to left. Remainder of both LE nontender with normal and painless. ROM.  UE: pain with extension and flexion of the left wrist. No significant ttp here. No edema. Remainder of both UE palpated and nontender with painless ROM. Psyche; normal affect,  calm and cooperative.   ED Course  Procedures (including critical care time)  Labs Reviewed  CBC WITH DIFFERENTIAL - Abnormal; Notable for the following:    WBC 11.8 (*)     RBC 3.49 (*)     Hemoglobin 10.7 (*)     HCT 32.4 (*)     Neutro Abs 8.7 (*)     Monocytes Absolute 1.1 (*)     All other components within normal limits  COMPREHENSIVE METABOLIC PANEL -  Abnormal; Notable for the following:    Glucose, Bld 178 (*)     BUN 42 (*)     Creatinine, Ser 1.58 (*)     Albumin 3.1 (*)     GFR calc non Af Amer 32 (*)     GFR calc Af Amer 37 (*)     All other components within normal limits  PRO B NATRIURETIC PEPTIDE - Abnormal; Notable for the following:    Pro B Natriuretic peptide (BNP) 2551.0 (*)     All other components within normal limits  TROPONIN I  URINALYSIS, ROUTINE W REFLEX MICROSCOPIC  CULTURE, BLOOD (ROUTINE X 2)  CULTURE, BLOOD (ROUTINE X 2)  PROTIME-INR  BASIC METABOLIC PANEL  CBC   Dg Chest 2 View  01/28/2012  *RADIOLOGY REPORT*  Clinical Data: Shortness of breath with fever.  Hypertension. Diabetes.  CHEST - 2 VIEW  Comparison: 12/27/2011  Findings: Cardiomegaly.  Dual lead pacer. Mild vascular congestion. No focal infiltrates. Mild bibasilar subsegmental atelectasis with low lung volumes.  Prior CABG.  Small right effusion.  No pneumothorax.  Osteopenia.  Slight worsening aeration compared with priors.  IMPRESSION: Cardiomegaly with mild vascular congestion.  Slight worsening aeration.   Original Report Authenticated By: Elsie Stain, M.D.    EKG: atrial paced rhythm, normal rate, normal qrs, no acute ischemic changes, normal intervals per pacemaker.  DDX: pna, pleural effusion, pulmonary edema, acs. PE unlikely given anticoagulation. Could have UTI or early cellulitis of the skin/soft tissue overlying the right knee. Exam does not support septic arthritis.   1. Pulmonary edema with congestive heart failure   2. Pneumonia   3. Contusion, knee   4. Diabetes  mellitus   5. Respiratory distress   6. Unspecified hypothyroidism   7. HYPERTENSION   8. SLEEP APNEA       MDM  Patient with decompensated heart failure and pulmonary edema. We are treating with Lasix 40 mg IV in the emergency department. I have discussed the case with the hospitalist who will see and admit. The patient's also has low-grade fever. Cannot exclude  community-acquired pneumonia based on patient's chest x-ray. We are just treating with Levaquin IV. Blood cultures have been obtained. Urinalysis is pending.        Brandt Loosen, MD 01/28/12 [phone]

## 2012-01-28 NOTE — ED Notes (Signed)
Temp earlier and she received tylenol at 2100

## 2012-01-28 NOTE — Progress Notes (Signed)
Pt seen and examined admitted with am with Diastolic CHF exacerbation And also swelling and contusion of R knee after fall Continue IV lasix today EF 60% based on echo 8/13 Continue Vancomycin Coumadin for LV thrombus Home soon  Zannie Cove, MD [phone]

## 2012-01-28 NOTE — Progress Notes (Signed)
ANTICOAGULATION/ANTIBIOTIC CONSULT NOTE - Initial Consult  Pharmacy Consult for Coumadin and Vancocin Indication: atrial fibrillation and cellulitis  Allergies  Allergen Reactions  . Sulfonamide Derivatives Other (See Comments)    Unknown; "childhood allergy/mother"    Patient Measurements: Weight: 120kg  Vital Signs: Temp: 100 F (37.8 C) (09/16 0442) Temp src: Oral (09/16 0442) BP: 135/96 mmHg (09/16 0442) Pulse Rate: 78  (09/16 0300)  Labs:  Basename 01/28/12 0322 01/28/12 0305 01/28/12 0017  HGB 10.6* -- 10.7*  HCT 31.7* -- 32.4*  PLT 171 -- 171  APTT -- -- --  LABPROT -- 22.9* --  INR -- 1.99* --  HEPARINUNFRC -- -- --  CREATININE 1.56* -- 1.58*  CKTOTAL -- -- --  CKMB -- -- --  TROPONINI -- -- <0.30    Medical History: Past Medical History  Diagnosis Date  . LBBB (left bundle branch block)   . Carotid bruit   . Dyslipidemia   . Hypertension   . CHF (congestive heart failure)     Diastolic heart failure but with history of variable ejection fractions most recent ejection fraction normal mild LVH June 2012 status post recent hospitalization in June for heart failure with volume overload  . Back pain, chronic     "just when I walk; mass on 3rd and 4th vertebrae right lower back"  . Cardiomyopathy, ischemic     Status post coronary bypass grafting no recurrent chest pain  . Cough   . History of recurrent UTIs   . Cystitis, acute   . Rhinosinusitis   . Dysuria   . 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 CRI (chronic renal insufficiency)   . Biventricular implantable cardiac defibrillator in situ     Status post device revision secondary to end of life  . Hypoxemia requiring supplemental oxygen   . Candidiasis of vulva and vagina   . Hypothyroidism   . ICD (implantable cardiac defibrillator) in place   . Lymphoma, non Hodgkin's     In remission  . 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  Truett Perna  . Old MI (myocardial infarction)     "I was never treated for it"  . Pneumonia 12/27/11    "3 times; it's been a long time ago"  . OSA on CPAP   . On home oxygen therapy 12/27/2011    "w/CPAP at night"  . Exertional dyspnea 12/27/2011  . DDD (degenerative disc disease)     multilevel  . Gout attack ~ 08/2011  . Pacemaker     Medications:  Prescriptions prior to admission  Medication Sig Dispense Refill  . allopurinol (ZYLOPRIM) 100 MG tablet Take 1 tablet (100 mg total) by mouth 2 (two) times daily.  60 tablet  3  . amLODipine (NORVASC) 5 MG tablet Take 1 tablet (5 mg total) by mouth daily.  30 tablet  3  . aspirin EC 81 MG tablet Take 81 mg by mouth daily.      . Calcium 600-200 MG-UNIT per tablet Take 1 tablet by mouth daily.        . carvedilol (COREG) 25 MG tablet Take 2 tablets by mouth twice a day  120 tablet  5  . Cholecalciferol (VITAMIN D) 1000 UNITS capsule Take 1,000 Units by mouth daily.        . citalopram (CELEXA) 20 MG tablet Take 1 tablet (20 mg total) by mouth daily.  30 tablet  6  .  enalapril (VASOTEC) 10 MG tablet Take 1 tablet (10 mg total) by mouth daily.  30 tablet  5  . gabapentin (NEURONTIN) 100 MG capsule Take 1 capsule (100 mg total) by mouth 2 (two) times daily.  60 capsule  5  . insulin NPH (HUMULIN N,NOVOLIN N) 100 UNIT/ML injection Inject 40-50 Units into the skin 2 (two) times daily. Inject 50 units every morning and inject 40 units every night.      . insulin regular (NOVOLIN R,HUMULIN R) 100 units/mL injection Inject 20-30 Units into the skin 2 (two) times daily before a meal. Inject 30 units every morning and inject 20 units every night.      . levothyroxine (SYNTHROID, LEVOTHROID) 25 MCG tablet Take 1 tablet (25 mcg total) by mouth daily.  90 tablet  1  . LORazepam (ATIVAN) 0.5 MG tablet Take 1 tablet (0.5 mg total) by mouth at bedtime.  90 tablet  1  . Multiple Vitamins-Minerals (MULTIVITAMIN WITH MINERALS) tablet Take 1 tablet by mouth daily.         . Omega-3 Fatty Acids (FISH OIL) 1200 MG CAPS Take 2 capsules by mouth 2 (two) times daily.        . rosuvastatin (CRESTOR) 10 MG tablet Take 1 tablet (10 mg total) by mouth daily.  30 tablet  6  . torsemide (DEMADEX) 20 MG tablet Take 30 mg by mouth 2 (two) times daily.       Marland Kitchen warfarin (COUMADIN) 5 MG tablet Take 5-7.5 mg by mouth See admin instructions. Takes 1.5 tablets (7.5mg ) on Thursday and Sunday. All other days takes 5mg .       Scheduled:    . acetaminophen  650 mg Oral Once  . allopurinol  100 mg Oral BID  . amLODipine  5 mg Oral Daily  . aspirin EC  81 mg Oral Daily  . carvedilol  25 mg Oral BID WC  . cefTRIAXone (ROCEPHIN)  IV  1 g Intravenous Q24H  . citalopram  20 mg Oral Daily  . enalapril  10 mg Oral Daily  . furosemide  40 mg Intravenous Once  . furosemide  40 mg Intravenous BID  . gabapentin  100 mg Oral BID  . insulin aspart  0-15 Units Subcutaneous TID WC  . insulin aspart  0-5 Units Subcutaneous QHS  . insulin NPH  40-50 Units Subcutaneous BID  . levofloxacin (LEVAQUIN) IV  750 mg Intravenous Once  . levothyroxine  25 mcg Oral Daily  . LORazepam  0.5 mg Oral QHS  . sodium chloride  3 mL Intravenous Q12H  . sodium chloride  3 mL Intravenous Q12H  . tetanus & diphtheria toxoids (adult)  0.5 mL Intramuscular Once  . vancomycin  1,500 mg Intravenous Q24H  . vancomycin  2,000 mg Intravenous Once  . warfarin  5 mg Oral Custom  . warfarin  7.5 mg Oral Custom  . Warfarin - Pharmacist Dosing Inpatient   Does not apply q1800  . DISCONTD: warfarin  5-7.5 mg Oral See admin instructions    Assessment: 71yo female with CHF c/o SOB and believes her knee is infected, c/o fevers, chills, and draining from knee, to begin IV ABX for cellulitis (CXR unremarkable for infection); also to continue Coumadin for Afib, admitted with therapeutic INR.  Goal of Therapy:  INR 2-3 Vancomycin trough 10-15   Plan:  Will give vancomycin 2000mg  IV x1 followed by 1500mg  IV Q24H and  monitor CBC, Cx, levels prn; will continue home Coumadin dose of 5mg   daily except 7.5mg  on ThuSun and monitor INR.  Colleen Can PharmD BCPS 01/28/2012,5:29 AM

## 2012-01-28 NOTE — Progress Notes (Signed)
Patient arrived to unit from ED via stretcher. Patient alert,oriented and ambulatory.  Admission weight and vitals done. Patient oriented to unit and fall safety plan reviewed. Patient currently resting comfortably with call light within reach, will continue to monitor. Filed Vitals:   01/28/12 0539  BP: 166/56  Pulse: 68  Temp: 98.7 F (37.1 C)  Resp: 20    Destiny [name] M

## 2012-01-28 NOTE — ED Notes (Signed)
Pt's husband reports giving pt two extra strength tylenol today around 2100.

## 2012-01-28 NOTE — Plan of Care (Signed)
Problem: Consults Goal: Heart Failure Patient Education (See Patient Education module for education specifics.)  Outcome: Progressing HF BOOKLET GIVEN AND REVIEWED. PATIENT WEIGHS DAILY AND LIMITS SODIUM INTAKE

## 2012-01-28 NOTE — Progress Notes (Signed)
Patient has home CPAP unit and states that she will place on herself tonight. RT will continue to monitor.

## 2012-01-28 NOTE — Progress Notes (Signed)
Ur done

## 2012-01-29 ENCOUNTER — Encounter (HOSPITAL_COMMUNITY): Payer: Self-pay | Admitting: Physician Assistant

## 2012-01-29 DIAGNOSIS — L0390 Cellulitis, unspecified: Secondary | ICD-10-CM | POA: Diagnosis present

## 2012-01-29 DIAGNOSIS — L0291 Cutaneous abscess, unspecified: Secondary | ICD-10-CM

## 2012-01-29 DIAGNOSIS — I5033 Acute on chronic diastolic (congestive) heart failure: Secondary | ICD-10-CM | POA: Diagnosis not present

## 2012-01-29 DIAGNOSIS — I4891 Unspecified atrial fibrillation: Secondary | ICD-10-CM | POA: Diagnosis not present

## 2012-01-29 DIAGNOSIS — I472 Ventricular tachycardia: Secondary | ICD-10-CM | POA: Diagnosis not present

## 2012-01-29 DIAGNOSIS — E119 Type 2 diabetes mellitus without complications: Secondary | ICD-10-CM

## 2012-01-29 DIAGNOSIS — Z951 Presence of aortocoronary bypass graft: Secondary | ICD-10-CM

## 2012-01-29 DIAGNOSIS — I509 Heart failure, unspecified: Secondary | ICD-10-CM

## 2012-01-29 LAB — BASIC METABOLIC PANEL
BUN: 52 mg/dL — ABNORMAL HIGH (ref 6–23)
Chloride: 97 mEq/L (ref 96–112)
Creatinine, Ser: 1.99 mg/dL — ABNORMAL HIGH (ref 0.50–1.10)
GFR calc Af Amer: 28 mL/min — ABNORMAL LOW (ref >90)

## 2012-01-29 LAB — GLUCOSE, CAPILLARY: Glucose-Capillary: 204 mg/dL — ABNORMAL HIGH (ref 70–99)

## 2012-01-29 MED ORDER — WARFARIN SODIUM 7.5 MG PO TABS
7.5000 mg | ORAL_TABLET | Freq: Once | ORAL | Status: AC
  Administered 2012-01-29: 7.5 mg via ORAL
  Filled 2012-01-29 (×2): qty 1

## 2012-01-29 MED ORDER — FUROSEMIDE 10 MG/ML IJ SOLN
40.0000 mg | Freq: Once | INTRAMUSCULAR | Status: AC
  Administered 2012-01-29: 40 mg via INTRAVENOUS
  Filled 2012-01-29: qty 4

## 2012-01-29 MED ORDER — FUROSEMIDE 10 MG/ML IJ SOLN
80.0000 mg | Freq: Two times a day (BID) | INTRAMUSCULAR | Status: DC
  Administered 2012-01-29 – 2012-01-31 (×4): 80 mg via INTRAVENOUS
  Filled 2012-01-29 (×5): qty 8

## 2012-01-29 MED ORDER — MAGNESIUM OXIDE 400 (241.3 MG) MG PO TABS
400.0000 mg | ORAL_TABLET | Freq: Every day | ORAL | Status: DC
  Administered 2012-01-29 – 2012-01-31 (×3): 400 mg via ORAL
  Filled 2012-01-29 (×5): qty 1

## 2012-01-29 NOTE — Progress Notes (Signed)
Patient had a 20 beat run of v-tach.  Patient asymptomatic. Patient sitting up in chair talking with husband. BP: 139/88, HR: 68.  Patient states that she feels fine. Paged Craige Cotta, NP last night.  Craige Cotta, NP  Called this morning to clarify receiving message and gave an order to get an EKG.

## 2012-01-29 NOTE — Progress Notes (Addendum)
[facility]             Progress Note   Assessment/Plan: 1. Acute on Chronic Diastolic CHF Continue IV lasix today 40mg  Q12 I/Os inaccurate Continue coreg EF 60% based on ECHO 8/13  2. R knee cellulitis, scab and bruising after fall Xray with soft tissue swelling Continue Vancomycin today could transition to PO Abx by tomorrow  3. NSVT: 20 beats last pm, asymptomatic Bmet pending, check Mag Continue coreg, will request cards input  4. H/o LV thrombus on coumadin  5. P.Afib/ BiV pacer Continue coumadin  6. OSA: CPAP QHS  7.  DIABETES MELLITUS:: stable, continue NPH   Ambulate Home soon    LOS: 1 day   Clarissa Laird [facility] Pager: [phone] 01/29/2012, 8:11 AM      Subjective: Breathing better, R knee a little better  Objective: Vital signs in last 24 hours: Temp:  [97.9 F (36.6 C)-98.8 F (37.1 C)] 98.8 F (37.1 C) (09/16 2052) Pulse Rate:  [61-68] 68  (09/16 2052) Resp:  [18-20] 18  (09/16 2052) BP: (123-139)/(52-88) 139/88 mmHg (09/16 2052) SpO2:  [91 %-97 %] 97 % (09/16 2052) Weight change:  Last BM Date: 01/27/12  Intake/Output from previous day: 09/16 0701 - 09/17 0700 In: 960 [P.O.:960] Out: 750 [Urine:750]     Physical Exam: General: Alert, awake, oriented x3, in no acute distress. HEENT: No bruits, no goiter. Heart: Regular rate and rhythm, without murmurs, rubs, gallops. Lungs: Clear to auscultation bilaterally. Abdomen: Soft, nontender, nondistended, positive bowel sounds. Extremities: R knee with bruising, large scab and surrounding erythema Neuro: Grossly intact, nonfocal.    Lab Results: Basic Metabolic Panel:  Basename 01/28/12 0322 01/28/12 0017  NA 136 136  K 4.3 4.1  CL 100 98  CO2 28 28  GLUCOSE 133* 178*  BUN 41* 42*  CREATININE 1.56* 1.58*  CALCIUM 9.2 9.3  MG -- --  PHOS -- --   Liver Function Tests:  Basename 01/28/12 0017  AST 15  ALT 15  ALKPHOS 62  BILITOT 0.4  PROT 6.3   ALBUMIN 3.1*   No results found for this basename: LIPASE:2,AMYLASE:2 in the last 72 hours No results found for this basename: AMMONIA:2 in the last 72 hours CBC:  Basename 01/28/12 0322 01/28/12 0017  WBC 11.5* 11.8*  NEUTROABS -- 8.7*  HGB 10.6* 10.7*  HCT 31.7* 32.4*  MCV 92.7 92.8  PLT 171 171   Cardiac Enzymes:  Basename 01/28/12 0017  CKTOTAL --  CKMB --  CKMBINDEX --  TROPONINI <0.30   BNP:  Basename 01/28/12 0322 01/28/12 0017  PROBNP 2931.0* 2551.0*   D-Dimer: No results found for this basename: DDIMER:2 in the last 72 hours CBG:  Basename 01/29/12 0629 01/28/12 2049 01/28/12 1631 01/28/12 1143 01/28/12 0629  GLUCAP 116* 173* 190* 185* 155*   Hemoglobin A1C: No results found for this basename: HGBA1C in the last 72 hours Fasting Lipid Panel: No results found for this basename: CHOL,HDL,LDLCALC,TRIG,CHOLHDL,LDLDIRECT in the last 72 hours Thyroid Function Tests: No results found for this basename: TSH,T4TOTAL,FREET4,T3FREE,THYROIDAB in the last 72 hours Anemia Panel: No results found for this basename: VITAMINB12,FOLATE,FERRITIN,TIBC,IRON,RETICCTPCT in the last 72 hours Coagulation:  Basename 01/29/12 0530 01/28/12 0305  LABPROT 19.6* 22.9*  INR 1.63* 1.99*   Urine Drug Screen: Drugs of Abuse  No results found for this basename: labopia, cocainscrnur, labbenz, amphetmu, thcu, labbarb    Alcohol Level: No results found for this basename: ETH:2 in the last 72 hours Urinalysis:  [facility] 01/28/12  0432  COLORURINE YELLOW  LABSPEC 1.016  PHURINE 5.5  GLUCOSEU NEGATIVE  HGBUR NEGATIVE  BILIRUBINUR NEGATIVE  KETONESUR NEGATIVE  PROTEINUR NEGATIVE  UROBILINOGEN 0.2  NITRITE NEGATIVE  LEUKOCYTESUR MODERATE*    Recent Results (from the past 240 hour(s))  MRSA PCR SCREENING     Status: Abnormal   Collection Time   01/28/12  5:40 AM      Component Value Range Status Comment   MRSA by PCR POSITIVE (*) NEGATIVE Final     Studies/Results: Dg  Chest 2 View  01/28/2012  *RADIOLOGY REPORT*  Clinical Data: Shortness of breath with fever.  Hypertension. Diabetes.  CHEST - 2 VIEW  Comparison: 12/27/2011  Findings: Cardiomegaly.  Dual lead pacer. Mild vascular congestion. No focal infiltrates. Mild bibasilar subsegmental atelectasis with low lung volumes.  Prior CABG.  Small right effusion.  No pneumothorax.  Osteopenia.  Slight worsening aeration compared with priors.  IMPRESSION: Cardiomegaly with mild vascular congestion.  Slight worsening aeration.   Original Report Authenticated By: Elsie Stain, M.D.    Dg Wrist Complete Left  01/28/2012  *RADIOLOGY REPORT*  Clinical Data: Status post fall 6 days ago.  Pain.  LEFT WRIST - COMPLETE 3+ VIEW  Comparison: None.  Findings: No acute bony or joint abnormality is identified. 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 There is degenerative disease at the base of the thumb and about the radiocarpal joint.  IMPRESSION: No acute abnormality.   Original Report Authenticated By: Bernadene Bell. D'ALESSIO, M.D.    Dg Knee Complete 4 Views Right  01/28/2012  *RADIOLOGY REPORT*  Clinical Data: Status post fall 6 days ago.  Pain.  RIGHT KNEE - COMPLETE 4+ VIEW  Comparison: None.  Findings: There is marked soft tissue swelling anterior to the patella without underlying fracture or dislocation.  No joint effusion.  Vascular clips in the medial soft tissues are noted. Joint spaces are preserved.  IMPRESSION: Soft tissue swelling anterior to the patella consistent with contusion/hemorrhage into the patellar bursa.  Negative for fracture.   Original Report Authenticated By: Bernadene Bell. Maricela Curet, M.D.     Medications: Scheduled Meds:   . allopurinol  100 mg Oral BID  . amLODipine  5 mg Oral Daily  . antiseptic oral rinse  15 mL Mouth Rinse BID  . aspirin EC  81 mg Oral Daily  . carvedilol  25 mg Oral BID WC  . citalopram  20 mg Oral Daily  . enalapril  10 mg Oral Daily  . furosemide  40 mg  Intravenous BID  . gabapentin  100 mg Oral BID  . influenza  inactive virus vaccine  0.5 mL Intramuscular Tomorrow-1000  . insulin aspart  0-15 Units Subcutaneous TID WC  . insulin aspart  0-5 Units Subcutaneous QHS  . insulin NPH  40 Units Subcutaneous Q supper  . insulin NPH  50 Units Subcutaneous QAC breakfast  . levothyroxine  25 mcg Oral Q0600  . LORazepam  0.5 mg Oral QHS  . mupirocin ointment   Nasal BID  . sodium chloride  3 mL Intravenous Q12H  . sodium chloride  3 mL Intravenous Q12H  . vancomycin  1,500 mg Intravenous Q24H  . vancomycin  2,000 mg Intravenous Once  . warfarin  5 mg Oral Custom  . warfarin  7.5 mg Oral Custom  . Warfarin - Pharmacist Dosing Inpatient   Does not apply q1800  . DISCONTD: cefTRIAXone (ROCEPHIN)  IV  1 g Intravenous Q24H   Continuous Infusions:  PRN  Meds:.sodium chloride, sodium chloride  Assessment/Plan:   1. Acute on Chronic Diastolic CHF Continue IV lasix today 40mg  Q12 I/Os inaccurate Continue coreg EF 60% based on ECHO 8/13  2. R knee cellulitis, scab and bruising after fall Xray with soft tissue swelling Continue Vancomycin today could transition to PO Abx by tomorrow  3. NSVT: 20 beats last pm, asymptomatic Bmet pending, check Mag Continue coreg, will request cards input  4. H/o LV thrombus on coumadin  5. P.Afib/ BiV pacer Continue coumadin  6. OSA: CPAP QHS  7.  DIABETES MELLITUS:: stable, continue NPH   Ambulate Home soon    LOS: 1 day   [facility] Pager: [phone] 01/29/2012, 8:11 AM

## 2012-01-29 NOTE — Progress Notes (Addendum)
ANTICOAGULATION CONSULT NOTE - Follow Up Consult  Pharmacy Consult for coumadin Indication: atrial fibrillation and hx LV thrombus  Allergies  Allergen Reactions  . Sulfonamide Derivatives Other (See Comments)    Unknown; "childhood allergy/mother"    Patient Measurements: Height: 5\' 4"  (162.6 cm) Weight: 275 lb 9.2 oz (125 kg) IBW/kg (Calculated) : 54.7    Vital Signs: BP: 115/64 mmHg (09/17 0922)  Labs:  Basename 01/29/12 0900 01/29/12 0530 01/28/12 0322 01/28/12 0305 01/28/12 0017  HGB -- -- 10.6* -- 10.7*  HCT -- -- 31.7* -- 32.4*  PLT -- -- 171 -- 171  APTT -- -- -- -- --  LABPROT -- 19.6* -- 22.9* --  INR -- 1.63* -- 1.99* --  HEPARINUNFRC -- -- -- -- --  CREATININE 1.99* -- 1.56* -- 1.58*  CKTOTAL -- -- -- -- --  CKMB -- -- -- -- --  TROPONINI -- -- -- -- <0.30    Estimated Creatinine Clearance: 34.9 ml/min (by C-G formula based on Cr of 1.99).   Assessment: 71 yo F on coumadin for afib and hx LV thrombus.  Home Coumadin dose is 5mg  daily except 7.5mg  on ThuSun.  Her INR today is 1.63 on her home dose. No bleeding reported.     Goal of Therapy:  INR 2-3    Plan:  1. Give coumadin 7.5 mg today instead of usual 5mg  2. Daily INR 3. Continue home Coumadin dose of 5mg  daily except 7.5mg  on ThuSun Herby Abraham, Pharm.D. [phone] 01/29/2012 10:47 AM

## 2012-01-29 NOTE — Consult Note (Signed)
CARDIOLOGY CONSULT NOTE   [name] MRN: [id number] DOB/AGE: 06-04-1940 71 y.o.  Admit date: 01/28/2012  Primary Physician   Letitia Libra, Ala Dach, MD Primary Cardiologist  Dr. Juline Patch Reason for Consultation   NSVT, 20 beats  [name] is a 71 y.o. morbidly obese female with a history of CAD s/p CABG x4 2003, CM s/p AICD replaced 2012, chronic diastolic CHF with EF 60%, PAF, h/o mural thrombus in LV, insulin dependent DM. Presented to ED on 9/15 with increasing SOB, low grade fever and knee laceration after a fall a few days prior.  Last night, 8:44PM, pt had a 20 beat run of NSVT at about 110 bpm documented on telemetry.. Pt was asymptomatic, sitting on side of bed talking to husband per RN note. Pt and her husband deny noticing sx including lightheadedness, chest pain, diaphoresis, N/V. States runs of arrythmias has happened a few times in the past during hospital stays (and [facility] did not find record of the arrythmia upon device interrogation in the past). She is generally AV paced. The patient does not recall being shocked and there is no evidence on telemetry of a delivered shock. She denies current chest pain, lightheadedness and palpitations. States her SOB has improved, however it increases significantly with exertion. Denies chest pain with exertion.   Past Medical History  Diagnosis Date  . LBBB (left bundle branch block)   . Carotid bruit 10/19/2011    a. 10/19/2011 carotid duplex - Mild hard plaque bilaterally. Stable 40-59% bilateral ICA stenosis.   Marland Kitchen Dyslipidemia   . Hypertension   . Chronic diastolic CHF (congestive heart failure)     Diastolic heart failure but with history of variable ejection fractions most recent ejection fraction normal mild LVH June 2012 status post recent hospitalization in June for heart failure with volume overload  . Back pain, chronic     "just when I walk; mass on 3rd and 4th vertebrae right lower back"  .  Cardiomyopathy, ischemic     05/2001 - s/p CABG x4, LIMA --> LAD, saphenous vein --> first diagonal artery, saphenous vein --> 2nd diagonal ,  saphenous vein --> RCA  . History of recurrent UTIs   . 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 CRI (chronic renal insufficiency)   . Biventricular implantable cardiac defibrillator in situ 2007, 2012    s/p device revision secondary to end of life  . Hypoxemia requiring supplemental oxygen   . Candidiasis of vulva and vagina   . Hypothyroidism   . 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 Truett Perna, in remission  . Pneumonia 12/27/11    "3 times; it's been a long time ago"  . OSA on CPAP   . Gout attack ~ 08/2011  . PAF (paroxysmal atrial fibrillation)     on Coumadin  . DM (diabetes mellitus), type 2 with complications     insulin dependent, retinopathy, neuropathy  . Mural thrombus of left ventricle     before 2003, while on Coumadin  . Morbid obesity with BMI of 45.0-49.9, adult     Ht. 5'4". BMI 47.2     Past Surgical History  Procedure Date  . Cardiac catheterization 06/03/01  . Cholecystectomy 1970  . Tubal ligation 1972  . Abdominal hysterectomy 1982  . Insert / replace / remove pacemaker 2007; 2012    w/AICD  . Coronary artery bypass graft  2003    CABG X4  . Cataract extraction     left eye    Allergies  Allergen Reactions  . Sulfonamide Derivatives Other (See Comments)    Unknown; "childhood allergy/mother"    I have reviewed the patient's current medications    . allopurinol  100 mg Oral BID  . amLODipine  5 mg Oral Daily  . antiseptic oral rinse  15 mL Mouth Rinse BID  . aspirin EC  81 mg Oral Daily  . carvedilol  25 mg Oral BID WC  . citalopram  20 mg Oral Daily  . enalapril  10 mg Oral Daily  . furosemide  40 mg Intravenous BID  . gabapentin  100 mg Oral BID  . influenza  inactive virus vaccine  0.5 mL Intramuscular Tomorrow-1000  . insulin aspart   0-15 Units Subcutaneous TID WC  . insulin aspart  0-5 Units Subcutaneous QHS  . insulin NPH  40 Units Subcutaneous Q supper  . insulin NPH  50 Units Subcutaneous QAC breakfast  . levothyroxine  25 mcg Oral Q0600  . LORazepam  0.5 mg Oral QHS  . mupirocin ointment   Nasal BID  . sodium chloride  3 mL Intravenous Q12H  . sodium chloride  3 mL Intravenous Q12H  . vancomycin  1,500 mg Intravenous Q24H  . warfarin  5 mg Oral Custom  . warfarin  7.5 mg Oral Custom  . Warfarin - Pharmacist Dosing Inpatient   Does not apply q1800  . DISCONTD: cefTRIAXone (ROCEPHIN)  IV  1 g Intravenous Q24H     sodium chloride, sodium chloride  Medication Sig  allopurinol (ZYLOPRIM) 100 MG tablet Take 1 tablet (100 mg total) by mouth 2 (two) times daily.  amLODipine (NORVASC) 5 MG tablet Take 1 tablet (5 mg total) by mouth daily.  aspirin EC 81 MG tablet Take 81 mg by mouth daily.  Calcium 600-200 MG-UNIT per tablet Take 1 tablet by mouth daily.    carvedilol (COREG) 25 MG tablet Take 2 tablets by mouth twice a day  Cholecalciferol (VITAMIN D) 1000 UNITS capsule Take 1,000 Units by mouth daily.    citalopram (CELEXA) 20 MG tablet Take 1 tablet (20 mg total) by mouth daily.  enalapril (VASOTEC) 10 MG tablet Take 1 tablet (10 mg total) by mouth daily.  gabapentin (NEURONTIN) 100 MG capsule Take 1 capsule (100 mg total) by mouth 2 (two) times daily.  insulin NPH (HUMULIN N,NOVOLIN N) 100 UNIT/ML injection Inject 40-50 Units into the skin 2 times daily. Inject 50 units every morning and inject 40 units every night.  insulin regular (NOVOLIN R,HUMULIN R) 100 units/mL injection Inject 20-30 Units into the skin 2 times daily before a meal. Inject 30 units every morning and inject 20 units every night.  levothyroxine (SYNTHROID, LEVOTHROID) 25 MCG tablet Take 1 tablet (25 mcg total) by mouth daily.  LORazepam (ATIVAN) 0.5 MG tablet Take 1 tablet (0.5 mg total) by mouth at bedtime.  Multiple Vitamins-Minerals tablet  Take 1 tablet by mouth daily.    Omega-3 Fatty Acids (FISH OIL) 1200 MG CAPS Take 2 capsules by mouth 2 (two) times daily.    rosuvastatin (CRESTOR) 10 MG tablet Take 1 tablet (10 mg total) by mouth daily.  torsemide (DEMADEX) 20 MG tablet Take 30 mg by mouth 2 (two) times daily.   warfarin (COUMADIN) 5 MG tablet Take 5-7.5 mg by mouth See admin instructions. Takes 1.5 tablets (7.5mg ) on Thursday and Sunday. All other days takes 5mg .  History   Social History  . Marital Status: Married    Spouse Name: N/A    Number of Children: Y  . Years of Education: N/A   Occupational History  . 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    Social History Main Topics  . Smoking status: Never Smoker   . Smokeless tobacco: Never Used  . Alcohol Use: No  . Drug Use: No  . Sexually Active: Not Currently   Other Topics Concern  . Not on file   Social History Narrative   Regular Exercise:  No     Family History  Problem Relation Age of Onset  . Cancer Mother     kidney and female repo  . Heart disease Father   . Asthma Mother      ROS:  Full 14 point review of systems complete and found to be negative unless listed above.  Physical Exam: Blood pressure 115/64, pulse 68, temperature 98.8 F (37.1 C), temperature source Oral, resp. rate 18, height 5\' 4"  (1.626 m), weight 275 lb 9.2 oz (125 kg), SpO2 97.00%.  General: Morbidly obese, female in no acute distress sitting comfortably in chair. Husband present in room. Head: Eyes PERRLA, No xanthomas.   Normocephalic and atraumatic, oropharynx without edema or exudate. Dentition poor Lungs: Clear to ascultation bilaterally. Breath sounds distant R >L. Heart: HRRR S1 S2, no rub/gallop. pulses are 2+ extrem.   Neck: No carotid bruits. No lymphadenopathy.  JVD. Abdomen: Bowel sounds present, abdomen soft and non-tender without masses or hernias noted. Msk:  No weakness, no joint deformities or effusions. Extremities: No clubbing or cyanosis.  Swelling of lower right leg noted, without pitting edema. Legs warm to touch R>L.  Neuro: Alert and oriented X 3. No focal deficits noted. Psych:  Good affect, responds appropriately Skin: Eccymosis noted on right elbow. Dry, healing abrasion on anterior right knee  Labs:   Lab Results  Component Value Date   WBC 11.5* 01/28/2012   HGB 10.6* 01/28/2012   HCT 31.7* 01/28/2012   MCV 92.7 01/28/2012   PLT 171 01/28/2012    Basename 01/29/12 0530  INR 1.63*     Lab 01/29/12 0900 01/28/12 0017  NA 134* --  K 4.2 --  CL 97 --  CO2 28 --  BUN 52* --  CREATININE 1.99* --  CALCIUM 9.0 --  PROT -- 6.3  BILITOT -- 0.4  ALKPHOS -- 62  ALT -- 15  AST -- 15  GLUCOSE 201* --    Basename 01/28/12 0017  CKTOTAL --  CKMB --  TROPONINI <0.30   Pro B Natriuretic peptide (BNP)  Date/Time Value Range Status  01/28/2012  3:22 AM 2931.0* 0 - 125 pg/mL Final  01/28/2012 12:17 AM 2551.0* 0 - 125 pg/mL Final   Magnesium  Date Value Range Status  01/29/2012 1.6  1.5 - 2.5 mg/dL Final     Echo: 01/10/5620 Study Conclusions - Left ventricle: The cavity size was normal. Wall thickness was increased in a pattern of mild LVH. Systolic function was normal. The estimated ejection fraction was in the range of 60% to 65%. - Mitral valve: Calcified annulus. Mildly thickened leaflets - Left atrium: The atrium was moderately dilated.  Carotid Duplex: 10/19/2011 Mild hard plaque bilaterally. Stable 40-59% bilateral ICA stenosis.   ECG:  01/29/2012 7:02AM - Ventricularlly paced +/- atrial pacing  Radiology:   Dg Chest 2 View 01/28/2012  *RADIOLOGY REPORT*  Clinical Data: Shortness of breath with fever.  Hypertension. Diabetes.  CHEST - 2 VIEW  Comparison: 12/27/2011  Findings: Cardiomegaly.  Dual lead pacer. Mild vascular congestion. No focal infiltrates. Mild bibasilar subsegmental atelectasis with low lung volumes.  Prior CABG.  Small right effusion.  No pneumothorax.  Osteopenia.  Slight worsening  aeration compared with priors.  IMPRESSION: Cardiomegaly with mild vascular congestion.  Slight worsening aeration.   Original Report Authenticated By: Elsie Stain, M.D.    Dg Knee Complete 4 Views Right 01/28/2012  *RADIOLOGY REPORT*  Clinical Data: Status post fall 6 days ago.  Pain.  RIGHT KNEE - COMPLETE 4+ VIEW  Comparison: None.  Findings: There is marked soft tissue swelling anterior to the patella without underlying fracture or dislocation.  No joint effusion.  Vascular clips in the medial soft tissues are noted. Joint spaces are preserved.  IMPRESSION: Soft tissue swelling anterior to the patella consistent with contusion/hemorrhage into the patellar bursa.  Negative for fracture.   Original Report Authenticated By: Bernadene Bell. D'ALESSIO, M.D.     ASSESSMENT AND PLAN:   The patient was seen today by Theodore Demark and Dr. Myrtis Ser, the patient evaluated and the data reviewed.   1. NSVT, 20 beats slow rate ~110bpm -  Will call [employer] to come interrogate the device. K+ is nl. Mg+ is to be drawn now. Otherwise no further workup. ** Magnesium ordered by primary MD has resulted and is low-normal at 1.6, will supplement orally.** ** Spoke with rep for [facility] [employer] - device interrogated, sensor was over-sensing and thinking pt needed a higher heart rate because of activity, when the activity was minimal and did not warrant the rate increase.  Ex, moving an arm or standing up - HR would increase to the 120's. There were no elevated V-rate episodes NOT caused by the sensor. The sensor was re-programmed and the patient's heart rate is more stable and reflective of her activity level. Will continue to follow.  2. Acute on Chronic diastolic CHF - BNP has increased since previous day. BiV ICD shows volume overload with change in impedence. CXR shows mild vascular congestion. Currently on Coreg 25 mg bid, Norvasc 5 mg qd. In hospital she is on Lasix 40 mg bid IV. At home she is on torsemide 30 mg bid PO. Her I&O has  been positive so far in the hospital. She needs a higher IV lasix dose. We will change to 80 mg IV bid and follow I&O and renal.  3. HTN - treated  4. Dyslipidemia - continue statin  5. Anemia - she is not chronically anemic based on CBC from past  6. Leukocytosis, mild - on ABX  7. Chronic renal insufficiency - SCr is back to her baseline  8. Right Knee injury - have thought about DVT, but clinically consistent with mechanical injury. Continue anticoagulation with Coumadin. Other treatment per primary team.  9. PAF  No atrial fib seen recently. Plan to continue coumadin.   Signed: Theodore Demark 01/29/2012, 10:33 AM Co-Sign MD Patient seen and examined. I agree with the assessment and plan as detailed above. See also my additional thoughts below.   We were called to see the patient for 20 beats of wide complex tachycardia with a rate of 110. There were no symptoms. Potassium was normal. Magnesium was low it is being supplemented. The ICD has been interrogated. There was adjustment made to a sensing mode the may have caused some increased heart rate. No further evaluation of the rhythm is needed. The patient did appear  to be volume overloaded when she was admitted. She is to be diuresed  with her renal function watched. Her knee injury is concerning. She fell. She did not have syncope. The right leg is swollen and warm. It is being treated for cellulitis. At this point there is no plan for venous Doppler. The patient is on Coumadin. The INR dropped a little since admission. It needs to be kept therapeutic. Review of the old records reveals that there is a history of paroxysmal atrial fibrillation. Coumadin is to be continued.  Willa Rough, MD, [facility] 01/29/2012 1:02 PM

## 2012-01-30 DIAGNOSIS — I5033 Acute on chronic diastolic (congestive) heart failure: Secondary | ICD-10-CM | POA: Diagnosis not present

## 2012-01-30 DIAGNOSIS — L0291 Cutaneous abscess, unspecified: Secondary | ICD-10-CM | POA: Diagnosis not present

## 2012-01-30 DIAGNOSIS — I509 Heart failure, unspecified: Secondary | ICD-10-CM | POA: Diagnosis not present

## 2012-01-30 LAB — BASIC METABOLIC PANEL
CO2: 20 mEq/L (ref 19–32)
Calcium: 9.3 mg/dL (ref 8.4–10.5)
Glucose, Bld: 188 mg/dL — ABNORMAL HIGH (ref 70–99)
Potassium: 4.7 mEq/L (ref 3.5–5.1)
Sodium: 133 mEq/L — ABNORMAL LOW (ref 135–145)

## 2012-01-30 LAB — GLUCOSE, CAPILLARY

## 2012-01-30 LAB — CBC
Hemoglobin: 10.3 g/dL — ABNORMAL LOW (ref 12.0–15.0)
MCH: 30.7 pg (ref 26.0–34.0)
RBC: 3.35 MIL/uL — ABNORMAL LOW (ref 3.87–5.11)

## 2012-01-30 LAB — PROTIME-INR: Prothrombin Time: 17.8 seconds — ABNORMAL HIGH (ref 11.6–15.2)

## 2012-01-30 MED ORDER — DOXYCYCLINE HYCLATE 100 MG PO TABS
100.0000 mg | ORAL_TABLET | Freq: Two times a day (BID) | ORAL | Status: DC
  Administered 2012-01-30 – 2012-01-31 (×3): 100 mg via ORAL
  Filled 2012-01-30 (×4): qty 1

## 2012-01-30 MED ORDER — WARFARIN SODIUM 10 MG PO TABS
10.0000 mg | ORAL_TABLET | Freq: Once | ORAL | Status: AC
  Administered 2012-01-30: 10 mg via ORAL
  Filled 2012-01-30: qty 1

## 2012-01-30 MED ORDER — INSULIN NPH (HUMAN) (ISOPHANE) 100 UNIT/ML ~~LOC~~ SUSP: 40.0000 [IU] | Freq: Every day | SUBCUTANEOUS | Status: DC

## 2012-01-30 NOTE — Progress Notes (Addendum)
Subjective: Breathing better.  Feeling better.  Wondering if she can be discharged today.  Knee feeling better.  Objective: Vital signs in last 24 hours: Filed Vitals:   01/29/12 1440 01/29/12 2114 01/30/12 0530 01/30/12 1054  BP: 122/50 134/61 142/57 117/69  Pulse: 62 64 64 60  Temp: 98.2 F (36.8 C) 98 F (36.7 C) 98.1 F (36.7 C) 98.1 F (36.7 C)  TempSrc: Oral Oral Oral Oral  Resp: 19 18 18 18   Height:      Weight:   125.9 kg (277 lb 9 oz)   SpO2: 95% 95% 94% 95%   Weight change:   Intake/Output Summary (Last 24 hours) at 01/30/12 1129 Last data filed at 01/30/12 1478  Gross per 24 hour  Intake    480 ml  Output    900 ml  Net   -420 ml    Physical Exam: General: Awake, Oriented, No acute distress. HEENT: EOMI. Neck: Supple CV: S1 and S2 Lungs: Clear to ascultation bilaterally Abdomen: Soft, Nontender, Nondistended, +bowel sounds. Ext: Good pulses. 2+ LE edema, Right knee Eschar with surrounding erythema.  Lab Results: Basic Metabolic Panel:  Lab 01/30/12 2956 01/29/12 0900 01/28/12 0322 01/28/12 0017  NA 133* 134* 136 136  K 4.7 4.2 4.3 4.1  CL 99 97 100 98  CO2 20 28 28 28   GLUCOSE 188* 201* 133* 178*  BUN 64* 52* 41* 42*  CREATININE 2.12* 1.99* 1.56* 1.58*  CALCIUM 9.3 9.0 9.2 9.3  MG -- 1.6 -- --  PHOS -- -- -- --   Liver Function Tests:  Lab 01/28/12 0017  AST 15  ALT 15  ALKPHOS 62  BILITOT 0.4  PROT 6.3  ALBUMIN 3.1*   No results found for this basename: LIPASE:5,AMYLASE:5 in the last 168 hours No results found for this basename: AMMONIA:5 in the last 168 hours CBC:  Lab 01/30/12 0610 01/28/12 0322 01/28/12 0017  WBC 10.4 11.5* 11.8*  NEUTROABS -- -- 8.7*  HGB 10.3* 10.6* 10.7*  HCT 31.2* 31.7* 32.4*  MCV 93.1 92.7 92.8  PLT 176 171 171   Cardiac Enzymes:  Lab 01/28/12 0017  CKTOTAL --  CKMB --  CKMBINDEX --  TROPONINI <0.30   BNP (last 3 results)  Basename 01/28/12 0322 01/28/12 0017  PROBNP 2931.0* 2551.0*    CBG:  Lab 01/30/12 0608 01/29/12 2128 01/29/12 1623 01/29/12 1141 01/29/12 0629  GLUCAP 168* 204* 167* 152* 116*   No results found for this basename: HGBA1C:5 in the last 72 hours Other Labs: No components found with this basename: POCBNP:3 No results found for this basename: DDIMER:2 in the last 168 hours No results found for this basename: CHOL:2,HDL:2,LDLCALC:2,TRIG:2,CHOLHDL:2,LDLDIRECT:2 in the last 168 hours No results found for this basename: TSH,T4TOTAL,FREET3,T3FREE,FREET4,THYROIDAB in the last 168 hours No results found for this basename: VITAMINB12:2,FOLATE:2,FERRITIN:2,TIBC:2,IRON:2,RETICCTPCT:2 in the last 168 hours  Micro Results: Recent Results (from the past 240 hour(s))  CULTURE, BLOOD (ROUTINE X 2)     Status: Normal (Preliminary result)   Collection Time   01/28/12  3:00 AM      Component Value Range Status Comment   Specimen Description BLOOD RIGHT ARM   Final    Special Requests BOTTLES DRAWN AEROBIC AND ANAEROBIC 6CC EA   Final    Culture  Setup Time 01/28/2012 14:17   Final    Culture     Final    Value:        BLOOD CULTURE RECEIVED NO GROWTH TO DATE CULTURE WILL BE HELD  FOR 5 DAYS BEFORE ISSUING A FINAL NEGATIVE REPORT   Report Status PENDING   Incomplete   CULTURE, BLOOD (ROUTINE X 2)     Status: Normal (Preliminary result)   Collection Time   01/28/12  3:18 AM      Component Value Range Status Comment   Specimen Description BLOOD RIGHT HAND   Final    Special Requests BOTTLES DRAWN AEROBIC AND ANAEROBIC 6CC EA   Final    Culture  Setup Time 01/28/2012 14:16   Final    Culture     Final    Value:        BLOOD CULTURE RECEIVED NO GROWTH TO DATE CULTURE WILL BE HELD FOR 5 DAYS BEFORE ISSUING A FINAL NEGATIVE REPORT   Report Status PENDING   Incomplete   MRSA PCR SCREENING     Status: Abnormal   Collection Time   01/28/12  5:40 AM      Component Value Range Status Comment   MRSA by PCR POSITIVE (*) NEGATIVE Final     Studies/Results: No results  found.  Medications: I have reviewed the patient's current medications. Scheduled Meds:   . allopurinol  100 mg Oral BID  . amLODipine  5 mg Oral Daily  . antiseptic oral rinse  15 mL Mouth Rinse BID  . aspirin EC  81 mg Oral Daily  . carvedilol  25 mg Oral BID WC  . citalopram  20 mg Oral Daily  . enalapril  10 mg Oral Daily  . furosemide  40 mg Intravenous Once  . furosemide  80 mg Intravenous BID  . gabapentin  100 mg Oral BID  . insulin aspart  0-15 Units Subcutaneous TID WC  . insulin aspart  0-5 Units Subcutaneous QHS  . insulin NPH  40 Units Subcutaneous Q supper  . insulin NPH  50 Units Subcutaneous QAC breakfast  . levothyroxine  25 mcg Oral Q0600  . LORazepam  0.5 mg Oral QHS  . magnesium oxide  400 mg Oral Daily  . mupirocin ointment   Nasal BID  . sodium chloride  3 mL Intravenous Q12H  . sodium chloride  3 mL Intravenous Q12H  . vancomycin  1,500 mg Intravenous Q24H  . warfarin  10 mg Oral ONCE-1800  . warfarin  7.5 mg Oral ONCE-1800  . Warfarin - Pharmacist Dosing Inpatient   Does not apply q1800  . DISCONTD: furosemide  40 mg Intravenous BID  . DISCONTD: warfarin  5 mg Oral Custom  . DISCONTD: warfarin  7.5 mg Oral Custom   Continuous Infusions:  PRN Meds:.sodium chloride, sodium chloride  Assessment/Plan: Acute on chronic respiratory failure due to acute on Chronic Diastolic CHF  Continue IV lasix today 80mg  Q12, patient on torsemide at home. Cardiology following, will defer to cardiology when the patient can be transitioned to torsemide. Continue coreg. EF 60% based on ECHO 8/13.  R knee cellulitis, scab and bruising after fall  Xray with soft tissue swelling. Discontinue Vancomycin, transition to doxycycline today.  Antibiotics since 01/28/2012.  NSVT 20 beats asymptomatic  Appreciate cardiology input.  Had her pacemaker interrogated. Continue coreg.  H/o LV thrombus Continue coumadin.  P.Afib/ BiV pacer  Continue coumadin.  OSA CPAP QHS    DIABETES MELLITUS Stable, continue NPH.  Chronic kidney disease stage III Creatinine at baseline.  Continue to monitor.  Prophylaxis Continue Coumadin.  Disposition Consider discharge in 1-2 days.   LOS: 2 days  Eiden Bagot A, MD 01/30/2012, 11:29 AM

## 2012-01-30 NOTE — Progress Notes (Signed)
Inpatient Diabetes Program Recommendations  AACE/ADA: New Consensus Statement on Inpatient Glycemic Control (2013)  Target Ranges:  Prepandial:   less than 140 mg/dL      Peak postprandial:   less than 180 mg/dL (1-2 hours)      Critically ill patients:  140 - 180 mg/dL   Reason for Visit: NPH ordered for am and ac supper rather than am and HS (as pt does at home).  Inpatient Diabetes Program Recommendations Insulin - Basal: NPH pm dose ordered for ac supper.  NPH needs be given at HS to cover basal needs during the night and early am hours. Will call pharmacy for change to HS.  (Pt takes Regular at supper at home).  Note: Thank you, Lenor Coffin, RN, CNS, Diabetes Coordinator [phone])

## 2012-01-30 NOTE — Consult Note (Signed)
CONSULT NOTE - Follow Up Consult  Pharmacy Consult for Coumadin ;  Vancomycin Indication: atrial fibrillation and hx LV thrombus ;  R-knee cellulitis  Allergies Allergies  Allergen Reactions  . Sulfonamide Derivatives Other (See Comments)    Unknown; "childhood allergy/mother"    Patient Measurements: Height: 5\' 4"  (162.6 cm) Weight: 277 lb 9 oz (125.9 kg) (SCALE A) IBW/kg (Calculated) : 54.7   Vital Signs: BP 142/57  Pulse 64  Temp 98.1 F (36.7 C) (Oral)  Resp 18  Ht 5\' 4"  (1.626 m)  Wt 277 lb 9 oz (125.9 kg)  BMI 47.64 kg/m2  SpO2 94%  Labs:  Basename 01/30/12 0610 01/29/12 0900 01/29/12 0530 01/28/12 0322 01/28/12 0305 01/28/12 0017  HGB 10.3* -- -- 10.6* -- --  HCT 31.2* -- -- 31.7* -- 32.4*  PLT 176 -- -- 171 -- 171  LABPROT 17.8* -- 19.6* -- 22.9* --  INR 1.51* -- 1.63* -- 1.99* --  CREATININE 2.12* 1.99* -- 1.56* -- --   Lab Results  Component Value Date   INR 1.51* 01/30/2012   INR 1.63* 01/29/2012   INR 1.99* 01/28/2012     Estimated Creatinine Clearance: 32.9 ml/min (by C-G formula based on Cr of 2.12).  Medications:  Prescriptions prior to admission  Medication Sig Dispense Refill  . allopurinol (ZYLOPRIM) 100 MG tablet Take 1 tablet (100 mg total) by mouth 2 (two) times daily.  60 tablet  3  . amLODipine (NORVASC) 5 MG tablet Take 1 tablet (5 mg total) by mouth daily.  30 tablet  3  . aspirin EC 81 MG tablet Take 81 mg by mouth daily.      . Calcium 600-200 MG-UNIT per tablet Take 1 tablet by mouth daily.        . carvedilol (COREG) 25 MG tablet Take 2 tablets by mouth twice a day  120 tablet  5  . Cholecalciferol (VITAMIN D) 1000 UNITS capsule Take 1,000 Units by mouth daily.        . citalopram (CELEXA) 20 MG tablet Take 1 tablet (20 mg total) by mouth daily.  30 tablet  6  . enalapril (VASOTEC) 10 MG tablet Take 1 tablet (10 mg total) by mouth daily.  30 tablet  5  . gabapentin (NEURONTIN) 100 MG capsule Take 1 capsule (100 mg total) by mouth 2  (two) times daily.  60 capsule  5  . insulin NPH (HUMULIN N,NOVOLIN N) 100 UNIT/ML injection Inject 40-50 Units into the skin 2 (two) times daily. Inject 50 units every morning and inject 40 units every night.      . insulin regular (NOVOLIN R,HUMULIN R) 100 units/mL injection Inject 20-30 Units into the skin 2 (two) times daily before a meal. Inject 30 units every morning and inject 20 units every night.      . levothyroxine (SYNTHROID, LEVOTHROID) 25 MCG tablet Take 1 tablet (25 mcg total) by mouth daily.  90 tablet  1  . LORazepam (ATIVAN) 0.5 MG tablet Take 1 tablet (0.5 mg total) by mouth at bedtime.  90 tablet  1  . Multiple Vitamins-Minerals (MULTIVITAMIN WITH MINERALS) tablet Take 1 tablet by mouth daily.        . Omega-3 Fatty Acids (FISH OIL) 1200 MG CAPS Take 2 capsules by mouth 2 (two) times daily.        . rosuvastatin (CRESTOR) 10 MG tablet Take 1 tablet (10 mg total) by mouth daily.  30 tablet  6  . torsemide (DEMADEX) 20 MG  tablet Take 30 mg by mouth 2 (two) times daily.       Marland Kitchen warfarin (COUMADIN) 5 MG tablet Take 5-7.5 mg by mouth See admin instructions. Takes 1.5 tablets (7.5mg ) on Thursday and Sunday. All other days takes 5mg .       Scheduled:    . allopurinol  100 mg Oral BID  . amLODipine  5 mg Oral Daily  . antiseptic oral rinse  15 mL Mouth Rinse BID  . aspirin EC  81 mg Oral Daily  . carvedilol  25 mg Oral BID WC  . citalopram  20 mg Oral Daily  . enalapril  10 mg Oral Daily  . furosemide  40 mg Intravenous Once  . furosemide  80 mg Intravenous BID  . gabapentin  100 mg Oral BID  . insulin aspart  0-15 Units Subcutaneous TID WC  . insulin aspart  0-5 Units Subcutaneous QHS  . insulin NPH  40 Units Subcutaneous Q supper  . insulin NPH  50 Units Subcutaneous QAC breakfast  . levothyroxine  25 mcg Oral Q0600  . LORazepam  0.5 mg Oral QHS  . magnesium oxide  400 mg Oral Daily  . mupirocin ointment   Nasal BID  . sodium chloride  3 mL Intravenous Q12H  . sodium  chloride  3 mL Intravenous Q12H  . vancomycin  1,500 mg Intravenous Q24H  . warfarin  5 mg Oral Custom  . warfarin  7.5 mg Oral Custom  . warfarin  7.5 mg Oral ONCE-1800  . Warfarin - Pharmacist Dosing Inpatient   Does not apply q1800  . DISCONTD: furosemide  40 mg Intravenous BID   Anti-infectives, Current    Start     Dose/Rate Route Frequency Ordered Stop   01/29/12 0600   vancomycin (VANCOCIN) 1,500 mg in sodium chloride 0.9 % 500 mL IVPB        1,500 mg 250 mL/hr over 120 Minutes Intravenous Every 24 hours 01/28/12 0528            Assessment:  71 yo F on coumadin for afib and hx LV thrombus. [Home Coumadin dose is 5mg  daily except 7.5mg  on ThuSun]  INR has continued to fall despite higher dose of Coumadin yesterday.  Scr has risen since Vancomycin started.  Noted that patient may transition to oral antibiotic(s) today.  Afebrile, WBC 10.4  Goal of Therapy:  Target INR 2-3   Vancomycin trough 10 - 15 mcg/ml   Plan:  Coumadin 10 mg today.  If patient continues on Vancomycin, will check Vancomycin trough before next dose [9/19].   Destiny [name], Deetta Perla.D 01/30/2012, 9:50 AM

## 2012-01-31 ENCOUNTER — Telehealth: Payer: Self-pay | Admitting: Internal Medicine

## 2012-01-31 DIAGNOSIS — E1142 Type 2 diabetes mellitus with diabetic polyneuropathy: Secondary | ICD-10-CM

## 2012-01-31 DIAGNOSIS — I509 Heart failure, unspecified: Secondary | ICD-10-CM | POA: Diagnosis not present

## 2012-01-31 DIAGNOSIS — E1149 Type 2 diabetes mellitus with other diabetic neurological complication: Secondary | ICD-10-CM

## 2012-01-31 DIAGNOSIS — L0390 Cellulitis, unspecified: Secondary | ICD-10-CM | POA: Diagnosis not present

## 2012-01-31 DIAGNOSIS — I5033 Acute on chronic diastolic (congestive) heart failure: Secondary | ICD-10-CM | POA: Diagnosis not present

## 2012-01-31 LAB — BASIC METABOLIC PANEL
Calcium: 9.6 mg/dL (ref 8.4–10.5)
GFR calc Af Amer: 27 mL/min — ABNORMAL LOW (ref >90)
GFR calc non Af Amer: 24 mL/min — ABNORMAL LOW (ref >90)
Glucose, Bld: 184 mg/dL — ABNORMAL HIGH (ref 70–99)
Sodium: 135 mEq/L (ref 135–145)

## 2012-01-31 LAB — CBC
HCT: 31 % — ABNORMAL LOW (ref 36.0–46.0)
Hemoglobin: 10.1 g/dL — ABNORMAL LOW (ref 12.0–15.0)
MCH: 30.3 pg (ref 26.0–34.0)
MCHC: 32.6 g/dL (ref 30.0–36.0)
MCV: 93.1 fL (ref 78.0–100.0)
Platelets: 210 10*3/uL (ref 150–400)
RBC: 3.33 MIL/uL — ABNORMAL LOW (ref 3.87–5.11)
RDW: 14.8 % (ref 11.5–15.5)
WBC: 9.8 10*3/uL (ref 4.0–10.5)

## 2012-01-31 LAB — GLUCOSE, CAPILLARY
Glucose-Capillary: 153 mg/dL — ABNORMAL HIGH (ref 70–99)
Glucose-Capillary: 151 mg/dL — ABNORMAL HIGH (ref 70–99)

## 2012-01-31 LAB — PROTIME-INR
INR: 1.5 — ABNORMAL HIGH (ref 0.00–1.49)
Prothrombin Time: 17.7 seconds — ABNORMAL HIGH (ref 11.6–15.2)

## 2012-01-31 LAB — MAGNESIUM: Magnesium: 2.2 mg/dL (ref 1.5–2.5)

## 2012-01-31 MED ORDER — DOXYCYCLINE HYCLATE 100 MG PO TABS: 100.0000 mg | ORAL_TABLET | Freq: Two times a day (BID) | ORAL | Status: DC

## 2012-01-31 MED ORDER — TORSEMIDE 20 MG PO TABS
30.0000 mg | ORAL_TABLET | Freq: Two times a day (BID) | ORAL | Status: DC
  Administered 2012-01-31: 30 mg via ORAL
  Filled 2012-01-31 (×3): qty 1

## 2012-01-31 NOTE — Telephone Encounter (Signed)
Angel from [address] [facility] called to make a post hospital follow up next week for patient. Dr Rodena Medin does not have an available 30 minute appointment next week and Lawanna Kobus said that patient will need to be seen next week. Can I schedule appointment with [facility] for this post hospital follow?

## 2012-01-31 NOTE — Progress Notes (Signed)
Subjective: Breathing better.  Right knee better. Wondering if she can be discharged today.  Objective: Vital signs in last 24 hours: Filed Vitals:   01/30/12 2043 01/31/12 0455 01/31/12 0500 01/31/12 0819  BP: 148/52 152/54  123/64  Pulse: 66 65  62  Temp: 98 F (36.7 C) 98.5 F (36.9 C)  98.1 F (36.7 C)  TempSrc: Oral Oral  Oral  Resp: 18 18  18   Height:      Weight:   125.283 kg (276 lb 3.2 oz)   SpO2: 92% 94%  95%   Weight change: -0.617 kg (-1 lb 5.8 oz)  Intake/Output Summary (Last 24 hours) at 01/31/12 0956 Last data filed at 01/31/12 0832  Gross per 24 hour  Intake    720 ml  Output   1601 ml  Net   -881 ml    Physical Exam: General: Awake, Oriented, No acute distress. HEENT: EOMI. Neck: Supple CV: S1 and S2 Lungs: Clear to ascultation bilaterally Abdomen: Soft, Nontender, Nondistended, +bowel sounds. Ext: Good pulses. 1-2+ LE edema, Right knee Eschar with surrounding erythema, improved from yesterday.  Lab Results: Basic Metabolic Panel:  Lab 01/31/12 1610 01/30/12 0610 01/29/12 0900 01/28/12 0322 01/28/12 0017  NA 135 133* 134* 136 136  K 5.0 4.7 4.2 4.3 4.1  CL 99 99 97 100 98  CO2 28 20 28 28 28   GLUCOSE 184* 188* 201* 133* 178*  BUN 73* 64* 52* 41* 42*  CREATININE 2.05* 2.12* 1.99* 1.56* 1.58*  CALCIUM 9.6 9.3 9.0 9.2 9.3  MG 2.2 -- 1.6 -- --  PHOS -- -- -- -- --   Liver Function Tests:  Lab 01/28/12 0017  AST 15  ALT 15  ALKPHOS 62  BILITOT 0.4  PROT 6.3  ALBUMIN 3.1*   No results found for this basename: LIPASE:5,AMYLASE:5 in the last 168 hours No results found for this basename: AMMONIA:5 in the last 168 hours CBC:  Lab 01/31/12 0600 01/30/12 0610 01/28/12 0322 01/28/12 0017  WBC 9.8 10.4 11.5* 11.8*  NEUTROABS -- -- -- 8.7*  HGB 10.1* 10.3* 10.6* 10.7*  HCT 31.0* 31.2* 31.7* 32.4*  MCV 93.1 93.1 92.7 92.8  PLT 210 176 171 171   Cardiac Enzymes:  Lab 01/28/12 0017  CKTOTAL --  CKMB --  CKMBINDEX --  TROPONINI <0.30    BNP (last 3 results)  Basename 01/28/12 0322 01/28/12 0017  PROBNP 2931.0* 2551.0*   CBG:  Lab 01/31/12 0605 01/30/12 2049 01/30/12 1619 01/30/12 1135 01/30/12 0608  GLUCAP 151* 153* 140* 160* 168*   No results found for this basename: HGBA1C:5 in the last 72 hours Other Labs: No components found with this basename: POCBNP:3 No results found for this basename: DDIMER:2 in the last 168 hours No results found for this basename: CHOL:2,HDL:2,LDLCALC:2,TRIG:2,CHOLHDL:2,LDLDIRECT:2 in the last 168 hours No results found for this basename: TSH,T4TOTAL,FREET3,T3FREE,FREET4,THYROIDAB in the last 168 hours No results found for this basename: VITAMINB12:2,FOLATE:2,FERRITIN:2,TIBC:2,IRON:2,RETICCTPCT:2 in the last 168 hours  Micro Results: Recent Results (from the past 240 hour(s))  CULTURE, BLOOD (ROUTINE X 2)     Status: Normal (Preliminary result)   Collection Time   01/28/12  3:00 AM      Component Value Range Status Comment   Specimen Description BLOOD RIGHT ARM   Final    Special Requests BOTTLES DRAWN AEROBIC AND ANAEROBIC 6CC EA   Final    Culture  Setup Time 01/28/2012 14:17   Final    Culture     Final  Value:        BLOOD CULTURE RECEIVED NO GROWTH TO DATE CULTURE WILL BE HELD FOR 5 DAYS BEFORE ISSUING A FINAL NEGATIVE REPORT   Report Status PENDING   Incomplete   CULTURE, BLOOD (ROUTINE X 2)     Status: Normal (Preliminary result)   Collection Time   01/28/12  3:18 AM      Component Value Range Status Comment   Specimen Description BLOOD RIGHT HAND   Final    Special Requests BOTTLES DRAWN AEROBIC AND ANAEROBIC 6CC EA   Final    Culture  Setup Time 01/28/2012 14:16   Final    Culture     Final    Value:        BLOOD CULTURE RECEIVED NO GROWTH TO DATE CULTURE WILL BE HELD FOR 5 DAYS BEFORE ISSUING A FINAL NEGATIVE REPORT   Report Status PENDING   Incomplete   MRSA PCR SCREENING     Status: Abnormal   Collection Time   01/28/12  5:40 AM      Component Value Range Status  Comment   MRSA by PCR POSITIVE (*) NEGATIVE Final     Studies/Results: No results found.  Medications: I have reviewed the patient's current medications. Scheduled Meds:    . allopurinol  100 mg Oral BID  . amLODipine  5 mg Oral Daily  . antiseptic oral rinse  15 mL Mouth Rinse BID  . aspirin EC  81 mg Oral Daily  . carvedilol  25 mg Oral BID WC  . citalopram  20 mg Oral Daily  . doxycycline  100 mg Oral Q12H  . enalapril  10 mg Oral Daily  . gabapentin  100 mg Oral BID  . insulin aspart  0-15 Units Subcutaneous TID WC  . insulin aspart  0-5 Units Subcutaneous QHS  . insulin NPH  40 Units Subcutaneous QHS  . insulin NPH  50 Units Subcutaneous QAC breakfast  . levothyroxine  25 mcg Oral Q0600  . LORazepam  0.5 mg Oral QHS  . magnesium oxide  400 mg Oral Daily  . mupirocin ointment   Nasal BID  . sodium chloride  3 mL Intravenous Q12H  . sodium chloride  3 mL Intravenous Q12H  . torsemide  30 mg Oral BID  . warfarin  10 mg Oral ONCE-1800  . Warfarin - Pharmacist Dosing Inpatient   Does not apply q1800  . DISCONTD: furosemide  80 mg Intravenous BID  . DISCONTD: insulin NPH  40 Units Subcutaneous Q supper  . DISCONTD: vancomycin  1,500 mg Intravenous Q24H  . DISCONTD: warfarin  5 mg Oral Custom  . DISCONTD: warfarin  7.5 mg Oral Custom   Continuous Infusions:  PRN Meds:.sodium chloride, sodium chloride  Assessment/Plan: Acute on chronic respiratory failure due to acute on Chronic Diastolic CHF  Cardiology transitioned IV lasix today 80mg  Q12 back to home torsemide dose today. Continue coreg. EF 60% based on ECHO 8/13. Admission weight: 125 kg, Weight today 125.28 kg.  R knee cellulitis, scab and bruising after fall  Xray with soft tissue swelling. Initially was on Vancomycin which was discontinued, transitioned to doxycycline on 01/30/2012.  Antibiotics since 01/28/2012. Define 2 week course of antibiotics.  NSVT 20 beats asymptomatic  Appreciate cardiology input, no  further workup.  Had her pacemaker interrogated. Continue coreg.  H/o LV thrombus Continue coumadin.  P.Afib/ BiV pacer  Continue coumadin.  OSA CPAP QHS   DIABETES MELLITUS Stable, continue NPH.  Chronic kidney disease stage  III Creatinine at baseline.  Continue to monitor.  Prophylaxis Continue Coumadin.  Disposition Discharge today.   LOS: 3 days  Dois Juarbe A, MD 01/31/2012, 9:56 AM

## 2012-01-31 NOTE — Progress Notes (Signed)
[name], female   DOB: 16-Mar-1941, 71 y.o.   MRN: [id number]    SUBJECTIVE: The patient is feeling better overall. She is diaries in her edema is improved.   Filed Vitals:   01/30/12 2043 01/31/12 0455 01/31/12 0500 01/31/12 0819  BP: 148/52 152/54  123/64  Pulse: 66 65  62  Temp: 98 F (36.7 C) 98.5 F (36.9 C)  98.1 F (36.7 C)  TempSrc: Oral Oral  Oral  Resp: 18 18  18   Height:      Weight:   276 lb 3.2 oz (125.283 kg)   SpO2: 92% 94%  95%    Intake/Output Summary (Last 24 hours) at 01/31/12 0852 Last data filed at 01/31/12 4098  Gross per 24 hour  Intake    720 ml  Output   1601 ml  Net   -881 ml    LABS: Basic Metabolic Panel:  Basename 01/31/12 0600 01/30/12 0610 01/29/12 0900  NA 135 133* --  K 5.0 4.7 --  CL 99 99 --  CO2 28 20 --  GLUCOSE 184* 188* --  BUN 73* 64* --  CREATININE 2.05* 2.12* --  CALCIUM 9.6 9.3 --  MG 2.2 -- 1.6  PHOS -- -- --   Liver Function Tests: No results found for this basename: AST:2,ALT:2,ALKPHOS:2,BILITOT:2,PROT:2,ALBUMIN:2 in the last 72 hours No results found for this basename: LIPASE:2,AMYLASE:2 in the last 72 hours CBC:  Basename 01/31/12 0600 01/30/12 0610  WBC 9.8 10.4  NEUTROABS -- --  HGB 10.1* 10.3*  HCT 31.0* 31.2*  MCV 93.1 93.1  PLT 210 176   Cardiac Enzymes: No results found for this basename: CKTOTAL:3,CKMB:3,CKMBINDEX:3,TROPONINI:3 in the last 72 hours BNP: No components found with this basename: POCBNP:3 D-Dimer: No results found for this basename: DDIMER:2 in the last 72 hours Hemoglobin A1C: No results found for this basename: HGBA1C in the last 72 hours Fasting Lipid Panel: No results found for this basename: CHOL,HDL,LDLCALC,TRIG,CHOLHDL,LDLDIRECT in the last 72 hours Thyroid Function Tests: No results found for this basename: TSH,T4TOTAL,FREET3,T3FREE,THYROIDAB in the last 72 hours  RADIOLOGY: Dg Chest 2 View  01/28/2012  *RADIOLOGY REPORT*  Clinical Data: Shortness of  breath with fever.  Hypertension. Diabetes.  CHEST - 2 VIEW  Comparison: 12/27/2011  Findings: Cardiomegaly.  Dual lead pacer. Mild vascular congestion. No focal infiltrates. Mild bibasilar subsegmental atelectasis with low lung volumes.  Prior CABG.  Small right effusion.  No pneumothorax.  Osteopenia.  Slight worsening aeration compared with priors.  IMPRESSION: Cardiomegaly with mild vascular congestion.  Slight worsening aeration.   Original Report Authenticated By: Elsie Stain, M.D.    Dg Wrist Complete Left  01/28/2012  *RADIOLOGY REPORT*  Clinical Data: Status post fall 6 days ago.  Pain.  LEFT WRIST - COMPLETE 3+ VIEW  Comparison: None.  Findings: No acute bony or joint abnormality is identified. 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 There is degenerative disease at the base of the thumb and about the radiocarpal joint.  IMPRESSION: No acute abnormality.   Original Report Authenticated By: Bernadene Bell. D'ALESSIO, M.D.    Dg Knee Complete 4 Views Right  01/28/2012  *RADIOLOGY REPORT*  Clinical Data: Status post fall 6 days ago.  Pain.  RIGHT KNEE - COMPLETE 4+ VIEW  Comparison: None.  Findings: There is marked soft tissue swelling anterior to the patella without underlying fracture or dislocation.  No joint effusion.  Vascular clips in the medial soft tissues are noted. Joint spaces are preserved.  IMPRESSION: Soft tissue swelling anterior to the patella consistent with contusion/hemorrhage into the patellar bursa.  Negative for fracture.   Original Report Authenticated By: Bernadene Bell. Maricela Curet, M.D.     PHYSICAL EXAM  Patient is oriented to person time and place. Affect is normal. There is no jugular venous distention. Lungs are clear. Respiratory effort is nonlabored. Cardiac exam reveals S1 and S2. The abdomen is soft. There is trace peripheral edema.   TELEMETRY: No significant arrhythmias are noted.   ASSESSMENT AND PLAN:   Cardiomyopathy, ischemic  Patient is  on the appropriate medications.   CKD (chronic kidney disease) stage 3, GFR 30-59 ml/min  Renal function is stable at this time.   Acute on chronic diastolic CHF (congestive heart failure)   Patient has diaries tend is improved. I have changed her IV Lasix to the Torsemide that she takes at home   Cellulitis  This is being treated.  From the cardiac viewpoint the patient is stable to be discharged home. Please make a followup appointment with Dr. Sharrell Ku of our group.   Willa Rough 01/31/2012 8:52 AM

## 2012-01-31 NOTE — Telephone Encounter (Signed)
Spoke to [employer] and she told me to schedule patient for next Thursday for post hospital follow up. I called Angel at [address] to inform her that we have scheduled patient. She will inform patient of this appointment.

## 2012-01-31 NOTE — Discharge Summary (Signed)
Physician Discharge Summary  Destiny [name]:[id number] DOB: May 20, 1940 DOA: 01/28/2012  PCP: Estill Cotta, MD  Admit date: 01/28/2012 Discharge date: 01/31/2012  Recommendations for Outpatient Follow-up:  1. Followup with PCP in 1 week. 2. Followup with cardiology in 1 week.  Discharge Diagnoses:  Principal Problem:  *Acute on chronic diastolic CHF (congestive heart failure) Active Problems:  NON-HODGKIN'S LYMPHOMA  Unspecified hypothyroidism  DIABETIC  RETINOPATHY  DIABETES MELLITUS, WITH AUTONOMIC NEUROPATHY  DYSLIPIDEMIA  HYPERTENSION  ATRIAL FIBRILLATION, PAROXYSMAL  CVA  LYMPHEDEMA  ICD - IN SITU  OSA (obstructive sleep apnea)  Cardiomyopathy, ischemic  CKD (chronic kidney disease) stage 3, GFR 30-59 ml/min  Hx of CABG  Cellulitis   Discharge Condition: Stable  Diet recommendation: Diabetic diet  Filed Weights   01/28/12 0539 01/30/12 0530 01/31/12 0500  Weight: 125 kg (275 lb 9.2 oz) 125.9 kg (277 lb 9 oz) 125.283 kg (276 lb 3.2 oz)    History of present illness:  71 year old female who is morbidly obese with history of cardiomyopathy and congestive heart failure, chronic respiratory failure who presented on 01/28/2012 with shortness of breath.  Hospital Course:  Acute on chronic respiratory failure due to acute on Chronic Diastolic CHF  Patient dieuresed with lasix, initally was on IV furosemide at 40 mg BID, which was increased to 80 mg IV BID. Cardiology transitioned IV lasix 80mg  Q12 back to home torsemide dose prior to discharge. Breathing improved during the hospital stay. Continue coreg. EF 60% based on ECHO 8/13. Admission weight: 125 kg, Weight today 125.28 kg. Dr. Myrtis Ser with cardiology evaluated the patient.  R knee cellulitis, scab and bruising after fall  Xray with soft tissue swelling. Initially was on Vancomycin which was discontinued, transitioned to doxycycline on 01/30/2012.  Antibiotics since 01/28/2012. Define 2 week course of  antibiotics.  NSVT 20 beats asymptomatic  During the hospital stay patient had 20 beat NSVT, nonsymptomatic.  Cardiology recommended no further workup.  Had her pacemaker interrogated. Continue coreg.  H/o LV thrombus Continue coumadin. Dose per pharmacy.  P.Afib/ BiV pacer  Continue coumadin.  OSA CPAP QHS   DIABETES MELLITUS Stable, continue NPH.  Chronic kidney disease stage III Creatinine at baseline (~2).  Continue to monitor.  Procedures:  None  Consultations:  Dr. Myrtis Ser, Cardiology  Discharge Exam: Filed Vitals:   01/30/12 2043 01/31/12 0455 01/31/12 0500 01/31/12 0819  BP: 148/52 152/54  123/64  Pulse: 66 65  62  Temp: 98 F (36.7 C) 98.5 F (36.9 C)  98.1 F (36.7 C)  TempSrc: Oral Oral  Oral  Resp: 18 18  18   Height:      Weight:   125.283 kg (276 lb 3.2 oz)   SpO2: 92% 94%  95%   Discharge Instructions  Discharge Orders    Future Appointments: Provider: Department: Dept Phone: Center:   02/12/2012 10:15 AM Rana Snare, NP [facility] [phone] None   02/13/2012 10:00 AM Edwyna Perfect, MD [facility] [phone] [facility]   03/31/2012 11:20 AM [facility] [phone] [facility]   09/10/2012 10:00 AM Barbaraann Share, MD [facility] [phone] None     Future Orders Please Complete By Expires   Diet Carb Modified      Increase activity slowly      Discharge instructions      Comments:   Followup with Letitia Libra, Ala Dach, MD (PCP) in 1 week. Followup with Dr. Ladona Ridgel (cardiology) in 1 week.  Medication List     As of 01/31/2012 10:05 AM    TAKE these medications         allopurinol 100 MG tablet   Commonly known as: ZYLOPRIM   Take 1 tablet (100 mg total) by mouth 2 (two) times daily.      amLODipine 5 MG tablet   Commonly known as: NORVASC   Take 1 tablet (5 mg total) by mouth daily.      aspirin EC 81 MG tablet   Take 81 mg by mouth daily.      Calcium  600-200 MG-UNIT per tablet   Take 1 tablet by mouth daily.      carvedilol 25 MG tablet   Commonly known as: COREG   Take 2 tablets by mouth twice a day      citalopram 20 MG tablet   Commonly known as: CELEXA   Take 1 tablet (20 mg total) by mouth daily.      doxycycline 100 MG tablet   Commonly known as: VIBRA-TABS   Take 1 tablet (100 mg total) by mouth every 12 (twelve) hours.      enalapril 10 MG tablet   Commonly known as: VASOTEC   Take 1 tablet (10 mg total) by mouth daily.      Fish Oil 1200 MG Caps   Take 2 capsules by mouth 2 (two) times daily.      gabapentin 100 MG capsule   Commonly known as: NEURONTIN   Take 1 capsule (100 mg total) by mouth 2 (two) times daily.      insulin NPH 100 UNIT/ML injection   Commonly known as: HUMULIN N,NOVOLIN N   Inject 40-50 Units into the skin 2 (two) times daily. Inject 50 units every morning and inject 40 units every night.      insulin regular 100 units/mL injection   Commonly known as: NOVOLIN R,HUMULIN R   Inject 20-30 Units into the skin 2 (two) times daily before a meal. Inject 30 units every morning and inject 20 units every night.      levothyroxine 25 MCG tablet   Commonly known as: SYNTHROID, LEVOTHROID   Take 1 tablet (25 mcg total) by mouth daily.      LORazepam 0.5 MG tablet   Commonly known as: ATIVAN   Take 1 tablet (0.5 mg total) by mouth at bedtime.      multivitamin with minerals tablet   Take 1 tablet by mouth daily.      rosuvastatin 10 MG tablet   Commonly known as: CRESTOR   Take 1 tablet (10 mg total) by mouth daily.      torsemide 20 MG tablet   Commonly known as: DEMADEX   Take 30 mg by mouth 2 (two) times daily.      Vitamin D 1000 UNITS capsule   Take 1,000 Units by mouth daily.      warfarin 5 MG tablet   Commonly known as: COUMADIN   Take 5-7.5 mg by mouth See admin instructions. Takes 1.5 tablets (7.5mg ) on Thursday and Sunday. All other days takes 5mg .           Follow-up  Information    Follow up with Letitia Libra, Ala Dach, MD. Schedule an appointment as soon as possible for a visit in 1 week.   Contact information:   [address] [place] [address] [phone]       Follow up with Lewayne Bunting, MD. Schedule an appointment as  soon as possible for a visit in 1 week.   Contact information:   [address] [place] [address] [phone]       Follow up with Coumadin/Warfarin clinic. On 02/04/2012. (Please have PT/INR checked and dose of coumadin adjusted by pharmacy as necessary.)           The results of significant diagnostics from this hospitalization (including imaging, microbiology, ancillary and laboratory) are listed below for reference.    Significant Diagnostic Studies: Dg Chest 2 View  01/28/2012  *RADIOLOGY REPORT*  Clinical Data: Shortness of breath with fever.  Hypertension. Diabetes.  CHEST - 2 VIEW  Comparison: 12/27/2011  Findings: Cardiomegaly.  Dual lead pacer. Mild vascular congestion. No focal infiltrates. Mild bibasilar subsegmental atelectasis with low lung volumes.  Prior CABG.  Small right effusion.  No pneumothorax.  Osteopenia.  Slight worsening aeration compared with priors.  IMPRESSION: Cardiomegaly with mild vascular congestion.  Slight worsening aeration.   Original Report Authenticated By: Elsie Stain, M.D.    Dg Wrist Complete Left  01/28/2012  *RADIOLOGY REPORT*  Clinical Data: Status post fall 6 days ago.  Pain.  LEFT WRIST - COMPLETE 3+ VIEW  Comparison: None.  Findings: No acute bony or joint abnormality is identified. 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 There is degenerative disease at the base of the thumb and about the radiocarpal joint.  IMPRESSION: No acute abnormality.   Original Report Authenticated By: Bernadene Bell. D'ALESSIO, M.D.    Dg Knee Complete 4 Views Right  01/28/2012  *RADIOLOGY REPORT*  Clinical Data: Status post fall 6 days ago.  Pain.   RIGHT KNEE - COMPLETE 4+ VIEW  Comparison: None.  Findings: There is marked soft tissue swelling anterior to the patella without underlying fracture or dislocation.  No joint effusion.  Vascular clips in the medial soft tissues are noted. Joint spaces are preserved.  IMPRESSION: Soft tissue swelling anterior to the patella consistent with contusion/hemorrhage into the patellar bursa.  Negative for fracture.   Original Report Authenticated By: Bernadene Bell. Maricela Curet, M.D.     Microbiology: Recent Results (from the past 240 hour(s))  CULTURE, BLOOD (ROUTINE X 2)     Status: Normal (Preliminary result)   Collection Time   01/28/12  3:00 AM      Component Value Range Status Comment   Specimen Description BLOOD RIGHT ARM   Final    Special Requests BOTTLES DRAWN AEROBIC AND ANAEROBIC 6CC EA   Final    Culture  Setup Time 01/28/2012 14:17   Final    Culture     Final    Value:        BLOOD CULTURE RECEIVED NO GROWTH TO DATE CULTURE WILL BE HELD FOR 5 DAYS BEFORE ISSUING A FINAL NEGATIVE REPORT   Report Status PENDING   Incomplete   CULTURE, BLOOD (ROUTINE X 2)     Status: Normal (Preliminary result)   Collection Time   01/28/12  3:18 AM      Component Value Range Status Comment   Specimen Description BLOOD RIGHT HAND   Final    Special Requests BOTTLES DRAWN AEROBIC AND ANAEROBIC 6CC EA   Final    Culture  Setup Time 01/28/2012 14:16   Final    Culture     Final    Value:        BLOOD CULTURE RECEIVED NO GROWTH TO DATE CULTURE WILL BE HELD FOR 5 DAYS BEFORE ISSUING A FINAL NEGATIVE REPORT  Report Status PENDING   Incomplete   MRSA PCR SCREENING     Status: Abnormal   Collection Time   01/28/12  5:40 AM      Component Value Range Status Comment   MRSA by PCR POSITIVE (*) NEGATIVE Final      Labs: Basic Metabolic Panel:  Lab 01/31/12 4098 01/30/12 0610 01/29/12 0900 01/28/12 0322 01/28/12 0017  NA 135 133* 134* 136 136  K 5.0 4.7 4.2 4.3 4.1  CL 99 99 97 100 98  CO2 28 20 28 28 28     GLUCOSE 184* 188* 201* 133* 178*  BUN 73* 64* 52* 41* 42*  CREATININE 2.05* 2.12* 1.99* 1.56* 1.58*  CALCIUM 9.6 9.3 9.0 9.2 9.3  MG 2.2 -- 1.6 -- --  PHOS -- -- -- -- --   Liver Function Tests:  Lab 01/28/12 0017  AST 15  ALT 15  ALKPHOS 62  BILITOT 0.4  PROT 6.3  ALBUMIN 3.1*   No results found for this basename: LIPASE:5,AMYLASE:5 in the last 168 hours No results found for this basename: AMMONIA:5 in the last 168 hours CBC:  Lab 01/31/12 0600 01/30/12 0610 01/28/12 0322 01/28/12 0017  WBC 9.8 10.4 11.5* 11.8*  NEUTROABS -- -- -- 8.7*  HGB 10.1* 10.3* 10.6* 10.7*  HCT 31.0* 31.2* 31.7* 32.4*  MCV 93.1 93.1 92.7 92.8  PLT 210 176 171 171   Cardiac Enzymes:  Lab 01/28/12 0017  CKTOTAL --  CKMB --  CKMBINDEX --  TROPONINI <0.30   BNP: BNP (last 3 results)  Basename 01/28/12 0322 01/28/12 0017  PROBNP 2931.0* 2551.0*   CBG:  Lab 01/31/12 0605 01/30/12 2049 01/30/12 1619 01/30/12 1135 01/30/12 0608  GLUCAP 151* 153* 140* 160* 168*    Time coordinating discharge: 25 minutes  Signed:  [place] Latulippe A  [facility] 01/31/2012, 10:05 AM

## 2012-02-03 LAB — CULTURE, BLOOD (ROUTINE X 2): Culture: NO GROWTH

## 2012-02-06 ENCOUNTER — Ambulatory Visit: Payer: Medicare Other | Admitting: Internal Medicine

## 2012-02-07 ENCOUNTER — Ambulatory Visit: Payer: Medicare Other | Admitting: Internal Medicine

## 2012-02-08 ENCOUNTER — Ambulatory Visit (INDEPENDENT_AMBULATORY_CARE_PROVIDER_SITE_OTHER): Payer: Medicare Other | Admitting: Physician Assistant

## 2012-02-08 ENCOUNTER — Ambulatory Visit (INDEPENDENT_AMBULATORY_CARE_PROVIDER_SITE_OTHER): Payer: Medicare Other

## 2012-02-08 ENCOUNTER — Encounter: Payer: Self-pay | Admitting: Physician Assistant

## 2012-02-08 VITALS — BP 132/59 | HR 60 | Resp 18 | Ht 64.0 in | Wt 257.8 lb

## 2012-02-08 DIAGNOSIS — I5032 Chronic diastolic (congestive) heart failure: Secondary | ICD-10-CM

## 2012-02-08 DIAGNOSIS — I2510 Atherosclerotic heart disease of native coronary artery without angina pectoris: Secondary | ICD-10-CM | POA: Diagnosis not present

## 2012-02-08 DIAGNOSIS — I4891 Unspecified atrial fibrillation: Secondary | ICD-10-CM

## 2012-02-08 DIAGNOSIS — I238 Other current complications following acute myocardial infarction: Secondary | ICD-10-CM

## 2012-02-08 DIAGNOSIS — I6350 Cerebral infarction due to unspecified occlusion or stenosis of unspecified cerebral artery: Secondary | ICD-10-CM

## 2012-02-08 DIAGNOSIS — I10 Essential (primary) hypertension: Secondary | ICD-10-CM

## 2012-02-08 DIAGNOSIS — I255 Ischemic cardiomyopathy: Secondary | ICD-10-CM

## 2012-02-08 DIAGNOSIS — N189 Chronic kidney disease, unspecified: Secondary | ICD-10-CM

## 2012-02-08 DIAGNOSIS — I2589 Other forms of chronic ischemic heart disease: Secondary | ICD-10-CM

## 2012-02-08 LAB — BASIC METABOLIC PANEL
CO2: 32 mEq/L (ref 19–32)
Calcium: 9.7 mg/dL (ref 8.4–10.5)
GFR: 34.18 mL/min — ABNORMAL LOW (ref >60.00)
Sodium: 140 mEq/L (ref 135–145)

## 2012-02-08 LAB — POCT INR: INR: 1.9

## 2012-02-08 NOTE — Progress Notes (Signed)
[address] [place], [place]  [address] Phone: [phone] Fax:  [phone]  Date:  02/08/2012   Name:  Destiny [name]   DOB:  1940/09/08   MRN:  [id number]  PCP:  Letitia Libra, Ala Dach, MD  Primary Cardiologist/Primary Electrophysiologist:  Dr. Lewayne Bunting    History of Present Illness: Destiny [name] is a 71 y.o. female who returns for post hospital follow up.  She has a history of CAD, s/p CABG in 2003, ischemic cardiomyopathy with improved LVF, status post BiV-AICD, diastolic CHF, paroxysmal atrial fibrillation, LV mural thrombus, chronic Coumadin therapy, DM2.  She was admitted 9/16-9/19 with a/c diastolic CHF. Cardiology was asked to see her for a 20 beat run of nonsustained ventricular tachycardia. Her device was interrogated and there were adjustments made in her rate response. There was evidence of volume overload by changes in her impedance on interrogation of her device. Diuresis was adjusted. She was also treated for right lower extremity cellulitis. Of note, she was admitted in 8/13 with acute on chronic renal failure and elevated CPK level (rhabdomyolysis). This was in the setting of colchicine and fenofibrate use. She was treated with IV fluids and her diuretic dose was reduced. Echocardiogram at that time demonstrated normal LV function.  Since discharge, she is doing well. She denies chest. She has chronic dyspnea with exertion. She is chronic class III. She denies changes. Her weights have gone down since returning home. She denies syncope. She sleeps on an incline. This is unchanged. She denies PND.  Wt Readings from Last 3 Encounters:  02/08/12 257 lb 12.8 oz (116.937 kg)  01/31/12 276 lb 3.2 oz (125.283 kg)  01/03/12 264 lb 8 oz (119.976 kg)     Past Medical History  Diagnosis Date  . LBBB (left bundle branch block)   . Carotid bruit 10/19/2011    a. 10/19/2011 carotid duplex - Mild hard plaque bilaterally. Stable 40-59% bilateral ICA  stenosis.   Marland Kitchen Dyslipidemia   . Hypertension   . Chronic diastolic CHF (congestive heart failure)     Diastolic heart failure but with history of variable ejection fractions most recent ejection fraction normal mild LVH June 2012 status post recent hospitalization in June for heart failure with volume overload  . Back pain, chronic     "just when I walk; mass on 3rd and 4th vertebrae right lower back"  . Cardiomyopathy, ischemic     05/2001 - s/p CABG x4, LIMA --> LAD, saphenous vein --> first diagonal artery, saphenous vein --> 2nd diagonal ,  saphenous vein --> RCA  . History of recurrent UTIs   . 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 CRI (chronic renal insufficiency)   . Biventricular implantable cardiac defibrillator in situ 2007, 2012    s/p device revision secondary to end of life  . Hypoxemia requiring supplemental oxygen   . Candidiasis of vulva and vagina   . Hypothyroidism   . 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 Truett Perna, in remission  . Pneumonia 12/27/11    "3 times; it's been a long time ago"  . OSA on CPAP   . Gout attack ~ 08/2011  . PAF (paroxysmal atrial fibrillation)     on Coumadin  . DM (diabetes mellitus), type 2 with complications     insulin dependent, retinopathy, neuropathy  . Mural thrombus of left ventricle     before 2003,  while on Coumadin, No h/o CVA  . Morbid obesity with BMI of 45.0-49.9, adult     Ht. 5'4". BMI 47.2    Current Outpatient Prescriptions  Medication Sig Dispense Refill  . allopurinol (ZYLOPRIM) 100 MG tablet Take 1 tablet (100 mg total) by mouth 2 (two) times daily.  60 tablet  3  . amLODipine (NORVASC) 5 MG tablet Take 1 tablet (5 mg total) by mouth daily.  30 tablet  3  . aspirin EC 81 MG tablet Take 81 mg by mouth daily.      . Calcium 600-200 MG-UNIT per tablet Take 1 tablet by mouth daily.        . carvedilol (COREG) 25 MG tablet Take 2 tablets by mouth twice a  day  120 tablet  5  . Cholecalciferol (VITAMIN D) 1000 UNITS capsule Take 1,000 Units by mouth daily.        . citalopram (CELEXA) 20 MG tablet Take 1 tablet (20 mg total) by mouth daily.  30 tablet  6  . doxycycline (VIBRA-TABS) 100 MG tablet Take 1 tablet (100 mg total) by mouth every 12 (twelve) hours.  22 tablet  0  . enalapril (VASOTEC) 10 MG tablet Take 1 tablet (10 mg total) by mouth daily.  30 tablet  5  . gabapentin (NEURONTIN) 100 MG capsule Take 1 capsule (100 mg total) by mouth 2 (two) times daily.  60 capsule  5  . insulin NPH (HUMULIN N,NOVOLIN N) 100 UNIT/ML injection Inject 40-50 Units into the skin 2 (two) times daily. Inject 50 units every morning and inject 40 units every night.      . insulin regular (NOVOLIN R,HUMULIN R) 100 units/mL injection Inject 20-30 Units into the skin 2 (two) times daily before a meal. Inject 30 units every morning and inject 20 units every night.      . levothyroxine (SYNTHROID, LEVOTHROID) 25 MCG tablet Take 1 tablet (25 mcg total) by mouth daily.  90 tablet  1  . LORazepam (ATIVAN) 0.5 MG tablet Take 1 tablet (0.5 mg total) by mouth at bedtime.  90 tablet  1  . Multiple Vitamins-Minerals (MULTIVITAMIN WITH MINERALS) tablet Take 1 tablet by mouth daily.        . rosuvastatin (CRESTOR) 10 MG tablet Take 1 tablet (10 mg total) by mouth daily.  30 tablet  6  . torsemide (DEMADEX) 20 MG tablet Take 30 mg by mouth 2 (two) times daily.       Marland Kitchen warfarin (COUMADIN) 5 MG tablet Take 5-7.5 mg by mouth See admin instructions. Takes 1.5 tablets (7.5mg ) on Thursday and Sunday. All other days takes 5mg .      . Omega-3 Fatty Acids (FISH OIL) 1200 MG CAPS Take 2 capsules by mouth 2 (two) times daily.        Marland Kitchen VIGAMOX 0.5 % ophthalmic solution       . DISCONTD: amLODipine (NORVASC) 5 MG tablet         Allergies: Allergies  Allergen Reactions  . Sulfonamide Derivatives Other (See Comments)    Unknown; "childhood allergy/mother"    History  Substance Use Topics   . Smoking status: Never Smoker   . Smokeless tobacco: Never Used  . Alcohol Use: No     ROS:  Please see the history of present illness.  Right leg cellulitis improved with reduced edema. Pain is improved.   All other systems reviewed and negative.   PHYSICAL EXAM: VS:  BP 132/59  Pulse 60  Resp  18  Ht 5\' 4"  (1.626 m)  Wt 257 lb 12.8 oz (116.937 kg)  BMI 44.25 kg/m2  SpO2 90% Well nourished, well developed, in no acute distress HEENT: normal Neck: Difficult to assess JVD Cardiac:  normal S1, S2; RRR; no murmur Lungs:  Decreased breath sounds bilaterally, no wheezing, rhonchi or rales Abd: soft, nontender, no hepatomegaly Ext: Right lower extremity with eschar formation over the patella; tight 1+ edema noted, left lower extremity with trace edema Skin: warm and dry Neuro:  CNs 2-12 intact, no focal abnormalities noted  EKG:  AV paced, heart rate 60      ASSESSMENT AND PLAN:  1. Chronic Diastolic CHF: Her volume is improved. She has continued right lower extremity edema in the setting of recent cellulitis and prior vein harvesting for CABG. Continue current diuretic regimen. Check a basic metabolic panel today. Followup with either me or Dr. Ladona Ridgel in 2-3 months.  2. Atrial Fibrillation: Maintaining sinus rhythm. She remains on Coumadin.  3. Right Lower Extremity Cellulitis: Management per primary care.  4. Coronary Artery Disease: Stable. No angina. Continue aspirin and statin.  5. Ischemic Cardiomyopathy: LV function has recovered. Her device was interrogated in the hospital. She is already scheduled for followup device check in November. Continue current dose of beta blocker and ACE inhibitor.  6. Carotid Stenosis: She is due for followup Dopplers 10/2012.  7. Hypertension: Controlled.  8. Chronic Kidney Disease: Repeat basic metabolic panel today.  9. Hyperlipidemia: Continue Crestor.  Luna Glasgow, PA-C  12:49 PM 02/08/2012

## 2012-02-08 NOTE — Patient Instructions (Addendum)
Your physician recommends that you continue on your current medications as directed. Please refer to the Current Medication list given to you today.  Your physician recommends that you HAVElab work TODAY: BMET  Your physician recommends that you schedule a follow-up appointment in: 3 MONTHS WITH EITHER SCOTT WEAVER, PA-C OR DR. Ladona Ridgel

## 2012-02-11 ENCOUNTER — Telehealth: Payer: Self-pay | Admitting: *Deleted

## 2012-02-11 NOTE — Telephone Encounter (Signed)
pt notified about lab results w/verbal understanding today 

## 2012-02-11 NOTE — Telephone Encounter (Signed)
Message copied by Tarri Fuller on Mon Feb 11, 2012 12:34 PM ------      Message from: [place], [place] T      Created: Sat Feb 09, 2012  5:10 PM       Renal fxn stable      Tereso Newcomer, [place]  5:10 PM 02/09/2012

## 2012-02-12 ENCOUNTER — Ambulatory Visit (HOSPITAL_BASED_OUTPATIENT_CLINIC_OR_DEPARTMENT_OTHER): Payer: Medicare Other | Admitting: Nurse Practitioner

## 2012-02-12 ENCOUNTER — Telehealth: Payer: Self-pay | Admitting: Oncology

## 2012-02-12 VITALS — BP 110/56 | HR 60 | Temp 98.0°F | Resp 18 | Ht 64.0 in | Wt 268.0 lb

## 2012-02-12 DIAGNOSIS — M545 Low back pain, unspecified: Secondary | ICD-10-CM

## 2012-02-12 DIAGNOSIS — C8595 Non-Hodgkin lymphoma, unspecified, lymph nodes of inguinal region and lower limb: Secondary | ICD-10-CM

## 2012-02-12 DIAGNOSIS — C8589 Other specified types of non-Hodgkin lymphoma, extranodal and solid organ sites: Secondary | ICD-10-CM

## 2012-02-12 NOTE — Progress Notes (Signed)
OFFICE PROGRESS NOTE  Interval history:  Destiny [name] returns as scheduled. She is feeling well. No fevers or sweats. She has a good appetite. No pain at the left inguinal/thigh region. No enlarged lymph nodes. Energy level is beginning to improve following a hospitalization in September with acute CHF and cellulitis.   Objective: Blood pressure 110/56, pulse 60, temperature 98 F (36.7 C), temperature source Oral, resp. rate 18, height 5\' 4"  (1.626 m), weight 268 lb (121.564 kg).  Oropharynx is without thrush or ulceration. No palpable cervical, supraclavicular, axillary or inguinal lymph nodes. No evidence of recurrent tumor at the left thigh. Lungs are clear. No wheezes or rales. Regular cardiac rhythm. Abdomen is soft and nontender. Obese. No obvious organomegaly. Large scab at the right knee. Right lower leg with trace edema.  Lab Results: Lab Results  Component Value Date   WBC 9.8 01/31/2012   HGB 10.1* 01/31/2012   HCT 31.0* 01/31/2012   MCV 93.1 01/31/2012   PLT 210 01/31/2012    Chemistry:    Chemistry      Component Value Date/Time   NA 140 02/08/2012 1314   K 4.3 02/08/2012 1314   CL 100 02/08/2012 1314   CO2 32 02/08/2012 1314   BUN 44* 02/08/2012 1314   CREATININE 1.6* 02/08/2012 1314   CREATININE 2.23* 12/26/2011 1236      Component Value Date/Time   CALCIUM 9.7 02/08/2012 1314   ALKPHOS 62 01/28/2012 0017   AST 15 01/28/2012 0017   ALT 15 01/28/2012 0017   BILITOT 0.4 01/28/2012 0017       Studies/Results: Dg Chest 2 View  01/28/2012  *RADIOLOGY REPORT*  Clinical Data: Shortness of breath with fever.  Hypertension. Diabetes.  CHEST - 2 VIEW  Comparison: 12/27/2011  Findings: Cardiomegaly.  Dual lead pacer. Mild vascular congestion. No focal infiltrates. Mild bibasilar subsegmental atelectasis with low lung volumes.  Prior CABG.  Small right effusion.  No pneumothorax.  Osteopenia.  Slight worsening aeration compared with priors.  IMPRESSION: Cardiomegaly with mild vascular  congestion.  Slight worsening aeration.   Original Report Authenticated By: Elsie Stain, M.D.    Dg Wrist Complete Left  01/28/2012  *RADIOLOGY REPORT*  Clinical Data: Status post fall 6 days ago.  Pain.  LEFT WRIST - COMPLETE 3+ VIEW  Comparison: None.  Findings: No acute bony or joint abnormality is identified. 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 There is degenerative disease at the base of the thumb and about the radiocarpal joint.  IMPRESSION: No acute abnormality.   Original Report Authenticated By: Bernadene Bell. D'ALESSIO, M.D.    Dg Knee Complete 4 Views Right  01/28/2012  *RADIOLOGY REPORT*  Clinical Data: Status post fall 6 days ago.  Pain.  RIGHT KNEE - COMPLETE 4+ VIEW  Comparison: None.  Findings: There is marked soft tissue swelling anterior to the patella without underlying fracture or dislocation.  No joint effusion.  Vascular clips in the medial soft tissues are noted. Joint spaces are preserved.  IMPRESSION: Soft tissue swelling anterior to the patella consistent with contusion/hemorrhage into the patellar bursa.  Negative for fracture.   Original Report Authenticated By: Bernadene Bell. Maricela Curet, M.D.     Medications: I have reviewed the patient's current medications.  Assessment/Plan:  1. Non-Hodgkin's lymphoma, high-grade B-cell lymphoma, involving left inguinal mass, status post core biopsy 03/21/2009. Staging PET scan on 03/16/2009 showed a left inguinal nodal conglomerate measuring 7 x 6.3 cm with intense hypermetabolic activity with additional hypermetabolic tumor and mass-like  swelling within the muscle groups extending to the left knee with a permeative appearance of the distal femoral metaphysis and femoral condyles. She completed 6 cycles of CHOP (Adriamycin was deleted and etoposide substituted) and rituximab with marked clinical and radiographic improvement. 2. Hospitalization 11/25 through 04/10/2009 with acute exacerbation of diastolic congestive  heart failure, likely secondary to Lasix being held and intravenous fluids given with cycle 1 of chemotherapy. 3. Ischemic cardiomyopathy status post implantation of a biventricular ICD 08/02/2005 with improvement in the ejection fraction. 4. Insulin-dependent diabetes. 5. Hypertension. 6. Hypercholesterolemia.  7. Hypertriglyceridemia. 8. Sleep apnea on CPAP and home oxygen. 9. History of peripheral neuropathy, unchanged following chemotherapy. 10. Chronic low back pain followed by Dr. Ethelene Hal. 11. Hospitalization September 2013 with acute CHF and cellulitis.  Disposition-Ms. Dabbs remains in clinical remission from the non-Hodgkin's lymphoma. She will return for a followup visit in 6 months. She will contact the office in the interim with any problems.  Plan reviewed with Dr. Truett Perna.  Destiny [name] ANP/GNP-BC

## 2012-02-12 NOTE — Telephone Encounter (Signed)
lvm for pt advising of march 2014 appt....mailed march appt schedule to pt....sed

## 2012-02-13 ENCOUNTER — Encounter: Payer: Self-pay | Admitting: Internal Medicine

## 2012-02-13 ENCOUNTER — Ambulatory Visit: Payer: Medicare Other | Admitting: Internal Medicine

## 2012-02-13 ENCOUNTER — Ambulatory Visit (INDEPENDENT_AMBULATORY_CARE_PROVIDER_SITE_OTHER): Payer: Medicare Other | Admitting: Internal Medicine

## 2012-02-13 VITALS — BP 128/68 | HR 63 | Temp 97.8°F | Resp 18 | Wt 269.5 lb

## 2012-02-13 DIAGNOSIS — L0390 Cellulitis, unspecified: Secondary | ICD-10-CM | POA: Diagnosis not present

## 2012-02-13 DIAGNOSIS — I2589 Other forms of chronic ischemic heart disease: Secondary | ICD-10-CM

## 2012-02-13 DIAGNOSIS — I255 Ischemic cardiomyopathy: Secondary | ICD-10-CM

## 2012-02-13 DIAGNOSIS — L0291 Cutaneous abscess, unspecified: Secondary | ICD-10-CM | POA: Diagnosis not present

## 2012-02-13 MED ORDER — DOXYCYCLINE HYCLATE 100 MG PO TABS: 100.0000 mg | ORAL_TABLET | Freq: Two times a day (BID) | ORAL | Status: AC

## 2012-02-13 NOTE — Progress Notes (Signed)
Subjective:    [name], female    DOB: May 28, 1940, 71 y.o.   MRN: [id number]  HPI Pt presents to clinic for followup of multiple medical problems. Recently hospitalized with a right leg cellulitis and volume overload. Was diuresed and placed on IV Avelox clindamycin before being transitioned to doxycycline amount. Just completed oral course of doxycycline. Notes significant improvement of right leg warmth and redness though mild residual remains at both. No fever or chills. Coumadin clinic following closely with antibiotic use. Influenza and tetanus booster provided during hospitalization.   Past Medical History  Diagnosis Date  . LBBB (left bundle branch block)   . Carotid bruit 10/19/2011    a. 10/19/2011 carotid duplex - Mild hard plaque bilaterally. Stable 40-59% bilateral ICA stenosis.   Marland Kitchen Dyslipidemia   . Hypertension   . Chronic diastolic CHF (congestive heart failure)     Diastolic heart failure but with history of variable ejection fractions most recent ejection fraction normal mild LVH June 2012 status post recent hospitalization in June for heart failure with volume overload  . Back pain, chronic     "just when I walk; mass on 3rd and 4th vertebrae right lower back"  . Cardiomyopathy, ischemic     05/2001 - s/p CABG x4, LIMA --> LAD, saphenous vein --> first diagonal artery, saphenous vein --> 2nd diagonal ,  saphenous vein --> RCA  . History of recurrent UTIs   . 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 CRI (chronic renal insufficiency)   . Biventricular implantable cardiac defibrillator in situ 2007, 2012    s/p device revision secondary to end of life  . Hypoxemia requiring supplemental oxygen   . Candidiasis of vulva and vagina   . Hypothyroidism   . 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 Truett Perna, in remission  . Pneumonia 12/27/11    "3 times; it's been a long time ago"  . OSA on CPAP   .  Gout attack ~ 08/2011  . PAF (paroxysmal atrial fibrillation)     on Coumadin  . DM (diabetes mellitus), type 2 with complications     insulin dependent, retinopathy, neuropathy  . Mural thrombus of left ventricle     before 2003, while on Coumadin, No h/o CVA  . Morbid obesity with BMI of 45.0-49.9, adult     Ht. 5'4". BMI 47.2   Past Surgical History  Procedure Date  . Cardiac catheterization 06/03/01  . Cholecystectomy 1970  . Tubal ligation 1972  . Abdominal hysterectomy 1982  . Insert / replace / remove pacemaker 2007; 2012    w/AICD  . Coronary artery bypass graft 2003    CABG X4  . Cataract extraction     left eye    reports that she has never smoked. She has never used smokeless tobacco. She reports that she does not drink alcohol or use illicit drugs. family history includes Asthma in her mother; Cancer in her mother; and Heart disease in her father. Allergies  Allergen Reactions  . Sulfonamide Derivatives Other (See Comments)    Unknown; "childhood allergy/mother"     Review of Systems see hpi     Objective:   Physical Exam  Nursing note and vitals reviewed. Constitutional: She appears well-developed and well-nourished. No distress.  HENT:  Head: Normocephalic and atraumatic.  Pulmonary/Chest: Effort normal and breath sounds normal. No respiratory distress. She has no wheezes. She has  no rales.  Neurological: She is alert.  Skin: Skin is warm and dry. She is not diaphoretic.       Right lower extremity-mild circumferential erythema with minimal warmth. No wound or drainage. Mild soft tissue swelling  Psychiatric: She has a normal mood and affect.          Assessment & Plan:

## 2012-02-13 NOTE — Assessment & Plan Note (Signed)
Clinically euvolemic. Continue current regimen

## 2012-02-13 NOTE — Assessment & Plan Note (Signed)
Improved but not resolved. Extend course of antibiotics additional one week of doxycycline. Followup if no improvement or worsening

## 2012-02-20 DIAGNOSIS — E7800 Pure hypercholesterolemia, unspecified: Secondary | ICD-10-CM | POA: Diagnosis not present

## 2012-02-20 DIAGNOSIS — I10 Essential (primary) hypertension: Secondary | ICD-10-CM | POA: Diagnosis not present

## 2012-02-20 DIAGNOSIS — E039 Hypothyroidism, unspecified: Secondary | ICD-10-CM | POA: Diagnosis not present

## 2012-02-22 ENCOUNTER — Ambulatory Visit (INDEPENDENT_AMBULATORY_CARE_PROVIDER_SITE_OTHER): Payer: Medicare Other | Admitting: *Deleted

## 2012-02-22 DIAGNOSIS — I6350 Cerebral infarction due to unspecified occlusion or stenosis of unspecified cerebral artery: Secondary | ICD-10-CM | POA: Diagnosis not present

## 2012-02-22 DIAGNOSIS — I4891 Unspecified atrial fibrillation: Secondary | ICD-10-CM

## 2012-02-22 DIAGNOSIS — I238 Other current complications following acute myocardial infarction: Secondary | ICD-10-CM

## 2012-02-22 LAB — POCT INR: INR: 2.4

## 2012-03-04 ENCOUNTER — Telehealth: Payer: Self-pay | Admitting: *Deleted

## 2012-03-04 NOTE — Telephone Encounter (Signed)
Pt called stating she has had a little trouble breathing. She is using her O2 all the time now. She has gained weight since her last hospital visit. She doubled her fluid pills to see if this would help. She wanted Dr. Rodena Medin to know and to ask what else should she do. Please advise.

## 2012-03-04 NOTE — Telephone Encounter (Signed)
Agree with increase of diuretic. Monitor sx's closely. appt if no improvement. ED if worsening shortness of breath

## 2012-03-04 NOTE — Telephone Encounter (Signed)
Pt notified that Dr. Rodena Medin agrees with increase in diuretic. Pt advised to monitor sx's closely, appt if no improvement and to got to ED if shortness of breath worsens.

## 2012-03-13 ENCOUNTER — Ambulatory Visit (INDEPENDENT_AMBULATORY_CARE_PROVIDER_SITE_OTHER): Payer: Medicare Other | Admitting: *Deleted

## 2012-03-13 DIAGNOSIS — I4891 Unspecified atrial fibrillation: Secondary | ICD-10-CM | POA: Diagnosis not present

## 2012-03-13 DIAGNOSIS — I238 Other current complications following acute myocardial infarction: Secondary | ICD-10-CM

## 2012-03-13 DIAGNOSIS — I6350 Cerebral infarction due to unspecified occlusion or stenosis of unspecified cerebral artery: Secondary | ICD-10-CM

## 2012-03-13 LAB — POCT INR: INR: 2

## 2012-03-31 ENCOUNTER — Encounter: Payer: Medicare Other | Admitting: *Deleted

## 2012-04-01 ENCOUNTER — Encounter: Payer: Self-pay | Admitting: *Deleted

## 2012-04-01 DIAGNOSIS — H2510 Age-related nuclear cataract, unspecified eye: Secondary | ICD-10-CM | POA: Diagnosis not present

## 2012-04-01 DIAGNOSIS — H2589 Other age-related cataract: Secondary | ICD-10-CM | POA: Diagnosis not present

## 2012-04-01 DIAGNOSIS — H25049 Posterior subcapsular polar age-related cataract, unspecified eye: Secondary | ICD-10-CM | POA: Diagnosis not present

## 2012-04-02 ENCOUNTER — Telehealth: Payer: Self-pay | Admitting: Internal Medicine

## 2012-04-02 ENCOUNTER — Encounter: Payer: Self-pay | Admitting: Internal Medicine

## 2012-04-02 ENCOUNTER — Encounter: Payer: Medicare Other | Admitting: *Deleted

## 2012-04-02 DIAGNOSIS — G4730 Sleep apnea, unspecified: Secondary | ICD-10-CM

## 2012-04-02 DIAGNOSIS — I428 Other cardiomyopathies: Secondary | ICD-10-CM | POA: Diagnosis not present

## 2012-04-02 MED ORDER — LORAZEPAM 0.5 MG PO TABS: 0.5000 mg | ORAL_TABLET | Freq: Every day | ORAL | Status: DC

## 2012-04-02 MED ORDER — AMLODIPINE BESYLATE 5 MG PO TABS: 5.0000 mg | ORAL_TABLET | Freq: Every day | ORAL | Status: DC

## 2012-04-02 NOTE — Telephone Encounter (Signed)
Spoke with [facility] pharmacy RE: Rx from 05.20.13 #90x1; appears that pt has only been receiving #30 at each dispense [05.20.13; 06.18.13; 07.18.13; 08.20.13; 09.22.13; 10.16.13] according to pharmacy records [?], no reason as to why & no notation for partial refill in pharmacy system [?]/SLS Ok per Vo to fill per [facility] #30x5; phoned to pharmacy/SLS

## 2012-04-02 NOTE — Telephone Encounter (Signed)
Lorazepam request [Last Rx 05.20.13 #90x1]/SLS Please advise.

## 2012-04-02 NOTE — Telephone Encounter (Signed)
If taking qhs and received #90 last month then shouldn't be out

## 2012-04-02 NOTE — Telephone Encounter (Signed)
Refill- lorazepam 0.5mg  tab. Take one tablet by mouth at bedtime. Qty 90 last fill 10.16.13  Refill- amlodipine 5mg  tab. Take one tablet by mouth every day. Qty 30 last fill 10.16.13

## 2012-04-04 LAB — REMOTE ICD DEVICE
AL IMPEDENCE ICD: 310 Ohm
BAMS-0001: 180 {beats}/min
BAMS-0003: 70 {beats}/min
HV IMPEDENCE: 45 Ohm
LV LEAD IMPEDENCE ICD: 290 Ohm
LV LEAD THRESHOLD: 1.25 V
RV LEAD IMPEDENCE ICD: 380 Ohm
TZAT-0004FASTVT: 8
TZAT-0004SLOWVT: 8
TZAT-0012FASTVT: 200 ms
TZAT-0018FASTVT: NEGATIVE
TZAT-0019FASTVT: 7.5 V
TZON-0003FASTVT: 310 ms
TZON-0003SLOWVT: 345 ms
TZON-0004FASTVT: 16
TZON-0010SLOWVT: 40 ms
TZST-0001FASTVT: 3
TZST-0001FASTVT: 4
TZST-0001SLOWVT: 2
TZST-0001SLOWVT: 3
TZST-0001SLOWVT: 5
TZST-0003FASTVT: 40 J
TZST-0003SLOWVT: 15 J
TZST-0003SLOWVT: 40 J

## 2012-04-15 ENCOUNTER — Ambulatory Visit (INDEPENDENT_AMBULATORY_CARE_PROVIDER_SITE_OTHER): Payer: Medicare Other

## 2012-04-15 DIAGNOSIS — I6350 Cerebral infarction due to unspecified occlusion or stenosis of unspecified cerebral artery: Secondary | ICD-10-CM

## 2012-04-15 DIAGNOSIS — I4891 Unspecified atrial fibrillation: Secondary | ICD-10-CM | POA: Diagnosis not present

## 2012-04-15 DIAGNOSIS — I238 Other current complications following acute myocardial infarction: Secondary | ICD-10-CM

## 2012-04-15 LAB — POCT INR: INR: 2.6

## 2012-04-16 ENCOUNTER — Telehealth: Payer: Self-pay | Admitting: Internal Medicine

## 2012-04-16 NOTE — Telephone Encounter (Signed)
Refill-torsemide 20mg  tab. Take one and one-half tablet by mouth twice daily. Qty 90 last fill 11.8.13

## 2012-04-16 NOTE — Telephone Encounter (Signed)
Torsemide request, not on active medication list [last px 09.19.13 by Dr. Myrtis Ser 30 mg BID, changed from IV lasix to oral]; not reported as active/taking at 10.02.13 OV/SLS Please advise.

## 2012-04-16 NOTE — Telephone Encounter (Signed)
Ok to fill 

## 2012-04-17 MED ORDER — TORSEMIDE 20 MG PO TABS: 30.0000 mg | ORAL_TABLET | Freq: Two times a day (BID) | ORAL | Status: DC

## 2012-04-17 NOTE — Telephone Encounter (Signed)
Rx to pharmacy/SLS 

## 2012-04-23 ENCOUNTER — Encounter: Payer: Self-pay | Admitting: *Deleted

## 2012-04-29 ENCOUNTER — Ambulatory Visit: Payer: Medicare Other | Admitting: Physician Assistant

## 2012-05-20 ENCOUNTER — Ambulatory Visit: Payer: Medicare Other | Admitting: Internal Medicine

## 2012-05-27 ENCOUNTER — Ambulatory Visit (INDEPENDENT_AMBULATORY_CARE_PROVIDER_SITE_OTHER): Payer: Medicare Other

## 2012-05-27 DIAGNOSIS — I6350 Cerebral infarction due to unspecified occlusion or stenosis of unspecified cerebral artery: Secondary | ICD-10-CM | POA: Diagnosis not present

## 2012-05-27 DIAGNOSIS — I4891 Unspecified atrial fibrillation: Secondary | ICD-10-CM

## 2012-05-27 DIAGNOSIS — I238 Other current complications following acute myocardial infarction: Secondary | ICD-10-CM | POA: Diagnosis not present

## 2012-06-04 ENCOUNTER — Ambulatory Visit: Payer: Medicare Other | Admitting: Internal Medicine

## 2012-06-09 ENCOUNTER — Ambulatory Visit (INDEPENDENT_AMBULATORY_CARE_PROVIDER_SITE_OTHER): Payer: Medicare Other | Admitting: Family Medicine

## 2012-06-09 ENCOUNTER — Encounter: Payer: Self-pay | Admitting: Family Medicine

## 2012-06-09 VITALS — BP 122/72 | HR 70 | Temp 97.6°F | Ht 64.0 in | Wt 271.1 lb

## 2012-06-09 DIAGNOSIS — Z9981 Dependence on supplemental oxygen: Secondary | ICD-10-CM

## 2012-06-09 DIAGNOSIS — R0902 Hypoxemia: Secondary | ICD-10-CM | POA: Diagnosis not present

## 2012-06-09 DIAGNOSIS — C8589 Other specified types of non-Hodgkin lymphoma, extranodal and solid organ sites: Secondary | ICD-10-CM

## 2012-06-09 DIAGNOSIS — C8590 Non-Hodgkin lymphoma, unspecified, unspecified site: Secondary | ICD-10-CM

## 2012-06-09 DIAGNOSIS — I10 Essential (primary) hypertension: Secondary | ICD-10-CM | POA: Diagnosis not present

## 2012-06-09 DIAGNOSIS — E785 Hyperlipidemia, unspecified: Secondary | ICD-10-CM

## 2012-06-09 DIAGNOSIS — E559 Vitamin D deficiency, unspecified: Secondary | ICD-10-CM

## 2012-06-09 DIAGNOSIS — M109 Gout, unspecified: Secondary | ICD-10-CM

## 2012-06-09 HISTORY — DX: Vitamin D deficiency, unspecified: E55.9

## 2012-06-09 MED ORDER — KRILL OIL PO CAPS: ORAL_CAPSULE | ORAL | Status: DC

## 2012-06-09 NOTE — Patient Instructions (Addendum)
Check labs prior to visit, vitamin D level, tsh, lipid, renal, cbc, hepatic  Labs at [facility] prior to visit, uric acid, vitamin d level, tsh, renal, cbc, hepatic, lipids  MegaRed caps daily by Schiff  Cholesterol Cholesterol is a white, waxy, fat-like protein needed by your body in small amounts. The liver makes all the cholesterol you need. It is carried from the liver by the blood through the blood vessels. Deposits (plaque) may build up on blood vessel walls. This makes the arteries narrower and stiffer. Plaque increases the risk for heart attack and stroke. You cannot feel your cholesterol level even if it is very high. The only way to know is by a blood test to check your lipid (fats) levels. Once you know your cholesterol levels, you should keep a record of the test results. Work with your caregiver to to keep your levels in the desired range. WHAT THE RESULTS MEAN:  Total cholesterol is a rough measure of all the cholesterol in your blood.  LDL is the so-called bad cholesterol. This is the type that deposits cholesterol in the walls of the arteries. You want this level to be low.  HDL is the good cholesterol because it cleans the arteries and carries the LDL away. You want this level to be high.  Triglycerides are fat that the body can either burn for energy or store. High levels are closely linked to heart disease. DESIRED LEVELS:  Total cholesterol below 200.  LDL below 100 for people at risk, below 70 for very high risk.  HDL above 50 is good, above 60 is best.  Triglycerides below 150. HOW TO LOWER YOUR CHOLESTEROL:  Diet.  Choose fish or white meat chicken and [place], roasted or baked. Limit fatty cuts of red meat, fried foods, and processed meats, such as sausage and lunch meat.  Eat lots of fresh fruits and vegetables. Choose whole grains, beans, pasta, potatoes and cereals.  Use only small amounts of olive, corn or canola oils. Avoid butter, mayonnaise, shortening or  palm kernel oils. Avoid foods with trans-fats.  Use skim/nonfat milk and low-fat/nonfat yogurt and cheeses. Avoid whole milk, cream, ice cream, egg yolks and cheeses. Healthy desserts include angel food cake, ginger snaps, animal crackers, hard candy, popsicles, and low-fat/nonfat frozen yogurt. Avoid pastries, cakes, pies and cookies.  Exercise.  A regular program helps decrease LDL and raises HDL.  Helps with weight control.  Do things that increase your activity level like gardening, walking, or taking the stairs.  Medication.  May be prescribed by your caregiver to help lowering cholesterol and the risk for heart disease.  You may need medicine even if your levels are normal if you have several risk factors. HOME CARE INSTRUCTIONS   Follow your diet and exercise programs as suggested by your caregiver.  Take medications as directed.  Have blood work done when your caregiver feels it is necessary. MAKE SURE YOU:   Understand these instructions.  Will watch your condition.  Will get help right away if you are not doing well or get worse. Document Released: 01/23/2001 Document Revised: 07/23/2011 Document Reviewed: 07/16/2007 [facility] Patient Information 2013 [place], [place].

## 2012-06-09 NOTE — Assessment & Plan Note (Signed)
Well controlled today, no changes 

## 2012-06-09 NOTE — Assessment & Plan Note (Signed)
Uses oxygen especially at night and she finds she sleeps much better.

## 2012-06-09 NOTE — Assessment & Plan Note (Signed)
On Allopurinol check uric acid at next visit

## 2012-06-09 NOTE — Assessment & Plan Note (Signed)
Follows with oncology no recurrence in 3 years.

## 2012-06-09 NOTE — Progress Notes (Signed)
[name], female   DOB: 12-14-1940, 72 y.o.   MRN: [id number] Destiny [name] [id number] 1940/08/05 06/09/2012      Progress Note-Follow Up  Subjective  Chief Complaint  Chief Complaint  Patient presents with  . Follow-up    3 month    HPI  Patient is a 72 year old Caucasian female who is in today accompanied by her husband with multiple medical problems. Recently she's done very well the recent past. She's not had any recent illness, fevers, chills, chest pain, palpitations. She did recently have to be hospitalized for CHF exacerbation but this is not happening is frequent he is to once daily. She reports she has learned how to titrate her furosemide to help improve her fluid status as needed. No other acute concerns. Has chronic back pain but this is not worse. No GI or GU c/o. Follows with endocrine for her sugars and notes these are well controlled Past Medical History  Diagnosis Date  . LBBB (left bundle branch block)   . Carotid bruit 10/19/2011    a. 10/19/2011 carotid duplex - Mild hard plaque bilaterally. Stable 40-59% bilateral ICA stenosis.   Marland Kitchen Dyslipidemia   . Hypertension   . Chronic diastolic CHF (congestive heart failure)     Diastolic heart failure but with history of variable ejection fractions most recent ejection fraction normal mild LVH June 2012 status post recent hospitalization in June for heart failure with volume overload  . Back pain, chronic     "just when I walk; mass on 3rd and 4th vertebrae right lower back"  . Cardiomyopathy, ischemic     05/2001 - s/p CABG x4, LIMA --> LAD, saphenous vein --> first diagonal artery, saphenous vein --> 2nd diagonal ,  saphenous vein --> RCA  . History of recurrent UTIs   . 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 CRI (chronic renal insufficiency)   . Biventricular implantable cardiac defibrillator in situ 2007, 2012    s/p device revision secondary to end of life  . Hypoxemia  requiring supplemental oxygen   . Candidiasis of vulva and vagina   . Hypothyroidism   . 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 Truett Perna, in remission  . Pneumonia 12/27/11    "3 times; it's been a long time ago"  . OSA on CPAP   . Gout attack ~ 08/2011  . PAF (paroxysmal atrial fibrillation)     on Coumadin  . DM (diabetes mellitus), type 2 with complications     insulin dependent, retinopathy, neuropathy  . Mural thrombus of left ventricle     before 2003, while on Coumadin, No h/o CVA  . Morbid obesity with BMI of 45.0-49.9, adult     Ht. 5'4". BMI 47.2  . Vitamin D deficiency 06/09/2012    historical    Past Surgical History  Procedure Date  . Cardiac catheterization 06/03/01  . Cholecystectomy 1970  . Tubal ligation 1972  . Abdominal hysterectomy 1982  . Insert / replace / remove pacemaker 2007; 2012    w/AICD  . Coronary artery bypass graft 2003    CABG X4  . Cataract extraction     left eye    Family History  Problem Relation Age of Onset  . Cancer Mother     kidney and female repo  . Heart disease Father   . Asthma Mother     History   Social History  .  Marital Status: Married    Spouse Name: N/A    Number of Children: Y  . Years of Education: N/A   Occupational History  . 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    Social History Main Topics  . Smoking status: Never Smoker   . Smokeless tobacco: Never Used  . Alcohol Use: No  . Drug Use: No  . Sexually Active: Not Currently   Other Topics Concern  . Not on file   Social History Narrative   Regular Exercise:  No    Current Outpatient Prescriptions on File Prior to Visit  Medication Sig Dispense Refill  . allopurinol (ZYLOPRIM) 100 MG tablet Take 100 mg by mouth daily.      Marland Kitchen amLODipine (NORVASC) 5 MG tablet Take 1 tablet (5 mg total) by mouth daily.  30 tablet  3  . aspirin EC 81 MG tablet Take 81 mg by mouth daily.      . Calcium 600-200 MG-UNIT per tablet  Take 1 tablet by mouth daily.        . carvedilol (COREG) 25 MG tablet Take 2 tablets by mouth twice a day  120 tablet  5  . citalopram (CELEXA) 20 MG tablet Take 1 tablet (20 mg total) by mouth daily.  30 tablet  6  . enalapril (VASOTEC) 10 MG tablet Take 1 tablet (10 mg total) by mouth daily.  30 tablet  5  . gabapentin (NEURONTIN) 100 MG capsule Take 1 capsule (100 mg total) by mouth 2 (two) times daily.  60 capsule  5  . insulin NPH (HUMULIN N,NOVOLIN N) 100 UNIT/ML injection Inject 40-50 Units into the skin 2 (two) times daily. Inject 50 units every morning and inject 40 units every night.      . insulin regular (NOVOLIN R,HUMULIN R) 100 units/mL injection Inject 20-30 Units into the skin 2 (two) times daily before a meal. Inject 30 units every morning and inject 20 units every night.      . levothyroxine (SYNTHROID, LEVOTHROID) 25 MCG tablet Take 1 tablet (25 mcg total) by mouth daily.  90 tablet  1  . LORazepam (ATIVAN) 0.5 MG tablet Take 1 tablet (0.5 mg total) by mouth at bedtime.  30 tablet  5  . Multiple Vitamins-Minerals (MULTIVITAMIN WITH MINERALS) tablet Take 1 tablet by mouth daily.        . rosuvastatin (CRESTOR) 10 MG tablet Take 1 tablet (10 mg total) by mouth daily.  30 tablet  6  . torsemide (DEMADEX) 20 MG tablet Take 1.5 tablets (30 mg total) by mouth 2 (two) times daily.  90 tablet  3  . warfarin (COUMADIN) 5 MG tablet Take 5-7.5 mg by mouth See admin instructions. Takes 1.5 tablets (7.5mg ) on Thursday and Sunday. All other days takes 5mg .      . Cholecalciferol (VITAMIN D) 1000 UNITS capsule Take 1,000 Units by mouth daily.          Allergies  Allergen Reactions  . Sulfonamide Derivatives Other (See Comments)    Unknown; "childhood allergy/mother"    Review of Systems  Review of Systems  Constitutional: Negative for fever and malaise/fatigue.  HENT: Negative for congestion.   Eyes: Negative for discharge.  Respiratory: Positive for sputum production. Negative for  shortness of breath.   Cardiovascular: Positive for leg swelling. Negative for chest pain and palpitations.  Gastrointestinal: Negative for nausea, abdominal pain and diarrhea.  Genitourinary: Negative for dysuria.  Musculoskeletal: Negative for falls.  Skin:  Negative for rash.  Neurological: Negative for loss of consciousness and headaches.  Endo/Heme/Allergies: Negative for polydipsia.  Psychiatric/Behavioral: Negative for depression and suicidal ideas. The patient is not nervous/anxious and does not have insomnia.     Objective  BP 122/72  Pulse 70  Temp 97.6 F (36.4 C) (Temporal)  Ht 5\' 4"  (1.626 m)  Wt 271 lb 1.9 oz (122.979 kg)  BMI 46.54 kg/m2  SpO2 88%  Physical Exam  Physical Exam  Constitutional: She is oriented to person, place, and time and well-developed, well-nourished, and in no distress. No distress.  HENT:  Head: Normocephalic and atraumatic.  Eyes: Conjunctivae normal are normal.  Neck: Neck supple. No thyromegaly present.  Cardiovascular: Normal rate and regular rhythm.   Murmur heard.      3-4/6 sys m  Pulmonary/Chest: Effort normal and breath sounds normal. She has no wheezes.  Abdominal: She exhibits no distension and no mass.  Musculoskeletal: She exhibits edema.       Trace edema b/l le  Lymphadenopathy:    She has no cervical adenopathy.  Neurological: She is alert and oriented to person, place, and time.  Skin: Skin is warm and dry. No rash noted. She is not diaphoretic.  Psychiatric: Memory, affect and judgment normal.    Lab Results  Component Value Date   TSH 2.78 09/25/2011   Lab Results  Component Value Date   WBC 9.8 01/31/2012   HGB 10.1* 01/31/2012   HCT 31.0* 01/31/2012   MCV 93.1 01/31/2012   PLT 210 01/31/2012   Lab Results  Component Value Date   CREATININE 1.6* 02/08/2012   BUN 44* 02/08/2012   NA 140 02/08/2012   K 4.3 02/08/2012   CL 100 02/08/2012   CO2 32 02/08/2012   Lab Results  Component Value Date   ALT 15  01/28/2012   AST 15 01/28/2012   ALKPHOS 62 01/28/2012   BILITOT 0.4 01/28/2012   Lab Results  Component Value Date   CHOL 152 09/25/2011   Lab Results  Component Value Date   HDL 48.10 09/25/2011   Lab Results  Component Value Date   LDLCALC  Value: UNABLE TO CALCULATE IF TRIGLYCERIDE OVER 400 mg/dL        Total Cholesterol/HDL:CHD Risk Coronary Heart Disease Risk Table                     Men   Women  1/2 Average Risk   3.4   3.3  Average Risk       5.0   4.4  2 X Average Risk   9.6   7.1  3 X Average Risk  23.4   11.0        Use the calculated Patient Ratio above and the CHD Risk Table to determine the patient's CHD Risk.        ATP III CLASSIFICATION (LDL):  <100     mg/dL   Optimal  130-865  mg/dL   Near or Above                    Optimal  130-159  mg/dL   Borderline  784-696  mg/dL   High  >295     mg/dL   Very High 06/21/4130   Lab Results  Component Value Date   TRIG 231.0* 09/25/2011   Lab Results  Component Value Date   CHOLHDL 3 09/25/2011     Assessment & Plan  HYPERTENSION Well controlled today, no  changes  Lymphoma, non-Hodgkin's Follows with oncology no recurrence in 3 years.  Hypoxemia requiring supplemental oxygen Uses oxygen especially at night and she finds she sleeps much better.  Gout flare (recent) On Allopurinol check uric acid at next visit  Vitamin D deficiency Continue Calcium with vitamin d daily and check level at next visit

## 2012-06-09 NOTE — Assessment & Plan Note (Signed)
Continue Calcium with vitamin d daily and check level at next visit

## 2012-06-11 ENCOUNTER — Other Ambulatory Visit: Payer: Self-pay | Admitting: Cardiology

## 2012-06-19 ENCOUNTER — Telehealth: Payer: Self-pay | Admitting: Internal Medicine

## 2012-06-19 MED ORDER — CITALOPRAM HYDROBROMIDE 20 MG PO TABS: 20.0000 mg | ORAL_TABLET | Freq: Every day | ORAL | Status: DC

## 2012-06-19 NOTE — Telephone Encounter (Signed)
Refill- citalopram 20mg  tab. Take one tablet by mouth every day. Qty 30 last fill 1.2.14

## 2012-07-07 ENCOUNTER — Ambulatory Visit (INDEPENDENT_AMBULATORY_CARE_PROVIDER_SITE_OTHER): Payer: Medicare Other | Admitting: *Deleted

## 2012-07-07 ENCOUNTER — Encounter: Payer: Self-pay | Admitting: Internal Medicine

## 2012-07-07 ENCOUNTER — Other Ambulatory Visit: Payer: Self-pay | Admitting: Internal Medicine

## 2012-07-07 DIAGNOSIS — I2589 Other forms of chronic ischemic heart disease: Secondary | ICD-10-CM

## 2012-07-07 DIAGNOSIS — I509 Heart failure, unspecified: Secondary | ICD-10-CM | POA: Diagnosis not present

## 2012-07-07 DIAGNOSIS — I255 Ischemic cardiomyopathy: Secondary | ICD-10-CM

## 2012-07-07 DIAGNOSIS — I5042 Chronic combined systolic (congestive) and diastolic (congestive) heart failure: Secondary | ICD-10-CM

## 2012-07-07 DIAGNOSIS — Z95810 Presence of automatic (implantable) cardiac defibrillator: Secondary | ICD-10-CM

## 2012-07-08 ENCOUNTER — Ambulatory Visit (INDEPENDENT_AMBULATORY_CARE_PROVIDER_SITE_OTHER): Payer: Medicare Other | Admitting: Pharmacist

## 2012-07-08 DIAGNOSIS — I4891 Unspecified atrial fibrillation: Secondary | ICD-10-CM | POA: Diagnosis not present

## 2012-07-08 DIAGNOSIS — I238 Other current complications following acute myocardial infarction: Secondary | ICD-10-CM

## 2012-07-08 LAB — POCT INR: INR: 1.5

## 2012-07-09 ENCOUNTER — Telehealth: Payer: Self-pay | Admitting: Internal Medicine

## 2012-07-09 MED ORDER — GABAPENTIN 100 MG PO CAPS: 100.0000 mg | ORAL_CAPSULE | Freq: Two times a day (BID) | ORAL | Status: DC

## 2012-07-09 MED ORDER — CARVEDILOL 25 MG PO TABS: ORAL_TABLET | ORAL | Status: DC

## 2012-07-09 NOTE — Telephone Encounter (Signed)
CARVEDILOL 25 MG TAB QTY 120 TAKE 2 BY MOUTH TWO TIMES DAILY  LAST FILL 06-11-2012 GABAPENTIN 100 MG CAP QTY 60 TAKE 1 BY MOUTH 2 TIMES DAILY LAST FILL 06-11-2012

## 2012-07-11 LAB — REMOTE ICD DEVICE
AL AMPLITUDE: 3.3 mv
AL THRESHOLD: 0.625 V
ATRIAL PACING ICD: 67 pct
LV LEAD THRESHOLD: 1.125 V
RV LEAD IMPEDENCE ICD: 390 Ohm
RV LEAD THRESHOLD: 0.75 V
TZAT-0001FASTVT: 1
TZAT-0012SLOWVT: 200 ms
TZAT-0013SLOWVT: 4
TZAT-0019SLOWVT: 7.5 V
TZAT-0020FASTVT: 1 ms
TZAT-0020SLOWVT: 1 ms
TZON-0004SLOWVT: 24
TZON-0005FASTVT: 6
TZON-0005SLOWVT: 6
TZON-0010FASTVT: 40 ms
TZST-0001FASTVT: 2
TZST-0001FASTVT: 5
TZST-0001SLOWVT: 2
TZST-0001SLOWVT: 4
TZST-0003FASTVT: 40 J
TZST-0003SLOWVT: 30 J

## 2012-07-14 ENCOUNTER — Ambulatory Visit (HOSPITAL_BASED_OUTPATIENT_CLINIC_OR_DEPARTMENT_OTHER): Payer: Medicare Other | Admitting: Oncology

## 2012-07-14 ENCOUNTER — Telehealth: Payer: Self-pay | Admitting: Internal Medicine

## 2012-07-14 VITALS — BP 144/47 | HR 66 | Temp 98.2°F | Resp 22 | Ht 64.0 in | Wt 269.4 lb

## 2012-07-14 DIAGNOSIS — G609 Hereditary and idiopathic neuropathy, unspecified: Secondary | ICD-10-CM | POA: Diagnosis not present

## 2012-07-14 MED ORDER — ENALAPRIL MALEATE 10 MG PO TABS: 10.0000 mg | ORAL_TABLET | Freq: Every day | ORAL | Status: DC

## 2012-07-14 NOTE — Telephone Encounter (Signed)
Refill- enalapril 10mg  tab. Take one tablet by mouth every day. Qty 30 last fill 2.28.14

## 2012-07-14 NOTE — Progress Notes (Signed)
   [facility]    OFFICE PROGRESS NOTE    INTERVAL HISTORY:   She returns as scheduled. She feels well. No fever or anorexia. No palpable lymph nodes. No discomfort at the left leg.  Objective:  Vital signs in last 24 hours:  Blood pressure 144/47, pulse 66, temperature 98.2 F (36.8 C), temperature source Oral, resp. rate 22, height 5\' 4"  (1.626 m), weight 269 lb 6.4 oz (122.199 kg).    HEENT: Neck without mass Lymphatics: No cervical, supraclavicular, axillary, or inguinal nodes. No mass at the left thigh. Resp: end inspiratory rhonchi at the posterior base bilaterally, no respiratory distress Cardio: Regular rate and rhythm GI: Nontender, no hepatosplenomegaly Vascular: No leg edema   Medications: I have reviewed the patient's current medications.  Assessment/Plan:  1. Non-Hodgkin's lymphoma, high-grade B-cell lymphoma, involving left inguinal mass, status post core biopsy 03/21/2009. Staging PET scan on 03/16/2009 showed a left inguinal nodal conglomerate measuring 7 x 6.3 cm with intense hypermetabolic activity with additional hypermetabolic tumor and mass-like swelling within the muscle groups extending to the left knee with a permeative appearance of the distal femoral metaphysis and femoral condyles. She completed 6 cycles of CHOP (Adriamycin was deleted and etoposide substituted) and rituximab with marked clinical and radiographic improvement. 2. Hospitalization 11/25 through 04/10/2009 with acute exacerbation of diastolic congestive heart failure, likely secondary to Lasix being held and intravenous fluids given with cycle 1 of chemotherapy. 3. Ischemic cardiomyopathy status post implantation of a biventricular ICD 08/02/2005 with improvement in the ejection fraction. 4. Insulin-dependent diabetes. 5. Hypertension. 6. Hypercholesterolemia.  7. Hypertriglyceridemia. 8. Sleep apnea on CPAP and home oxygen. 9. History of peripheral neuropathy, unchanged  following chemotherapy. 10. Chronic low back pain followed by Dr. Ethelene Hal. 11. Hospitalization September 2013 with acute CHF and cellulitis. 12. Anemia during the September 2013 hospital addition-? Related to the acute infection/probably phlebotomy versus renal insufficiency, we will ask Dr. Rogelia Rohrer to check a CBC at the next scheduled office visit     Disposition:  She remains in clinical remission from non-Hodgkin's lymphoma. She will return for an office visit in 6 months. We will ask her to followup with Dr. Rogelia Rohrer for the anemia noted last year.   Thornton Papas, MD  07/14/2012  11:32 AM

## 2012-07-15 ENCOUNTER — Telehealth: Payer: Self-pay | Admitting: Oncology

## 2012-07-15 NOTE — Telephone Encounter (Signed)
s.w. pt and advised on 9.4.14 appt...Marland KitchenMarland KitchenDone

## 2012-07-23 ENCOUNTER — Encounter: Payer: Self-pay | Admitting: *Deleted

## 2012-07-30 ENCOUNTER — Ambulatory Visit (INDEPENDENT_AMBULATORY_CARE_PROVIDER_SITE_OTHER): Payer: Medicare Other | Admitting: *Deleted

## 2012-07-30 DIAGNOSIS — I238 Other current complications following acute myocardial infarction: Secondary | ICD-10-CM | POA: Diagnosis not present

## 2012-07-30 DIAGNOSIS — I6350 Cerebral infarction due to unspecified occlusion or stenosis of unspecified cerebral artery: Secondary | ICD-10-CM | POA: Diagnosis not present

## 2012-07-30 LAB — POCT INR: INR: 1.8

## 2012-08-11 ENCOUNTER — Telehealth: Payer: Self-pay | Admitting: Family Medicine

## 2012-08-11 MED ORDER — TORSEMIDE 20 MG PO TABS: 30.0000 mg | ORAL_TABLET | Freq: Two times a day (BID) | ORAL | Status: DC

## 2012-08-11 NOTE — Telephone Encounter (Signed)
Refill torsemide 20 mg tab take 1 and 1/2 tablets by mouth twice daily qty 90

## 2012-08-20 ENCOUNTER — Ambulatory Visit (INDEPENDENT_AMBULATORY_CARE_PROVIDER_SITE_OTHER): Payer: Medicare Other | Admitting: *Deleted

## 2012-08-20 DIAGNOSIS — I4891 Unspecified atrial fibrillation: Secondary | ICD-10-CM

## 2012-08-20 DIAGNOSIS — I6350 Cerebral infarction due to unspecified occlusion or stenosis of unspecified cerebral artery: Secondary | ICD-10-CM | POA: Diagnosis not present

## 2012-08-20 DIAGNOSIS — I238 Other current complications following acute myocardial infarction: Secondary | ICD-10-CM | POA: Diagnosis not present

## 2012-08-20 LAB — POCT INR: INR: 2.4

## 2012-08-25 ENCOUNTER — Telehealth: Payer: Self-pay | Admitting: Family Medicine

## 2012-08-25 MED ORDER — AMLODIPINE BESYLATE 5 MG PO TABS: 5.0000 mg | ORAL_TABLET | Freq: Every day | ORAL | Status: DC

## 2012-08-25 NOTE — Telephone Encounter (Signed)
Amlodipine 5mg  tab qty 30 take 1 tablet by mouth every day

## 2012-08-25 NOTE — Telephone Encounter (Signed)
RX sent

## 2012-09-01 ENCOUNTER — Ambulatory Visit: Payer: Medicare Other | Admitting: Family Medicine

## 2012-09-08 DIAGNOSIS — I10 Essential (primary) hypertension: Secondary | ICD-10-CM | POA: Diagnosis not present

## 2012-09-08 DIAGNOSIS — E7800 Pure hypercholesterolemia, unspecified: Secondary | ICD-10-CM | POA: Diagnosis not present

## 2012-09-08 DIAGNOSIS — E039 Hypothyroidism, unspecified: Secondary | ICD-10-CM | POA: Diagnosis not present

## 2012-09-08 DIAGNOSIS — G609 Hereditary and idiopathic neuropathy, unspecified: Secondary | ICD-10-CM | POA: Diagnosis not present

## 2012-09-10 ENCOUNTER — Other Ambulatory Visit: Payer: Self-pay | Admitting: Pulmonary Disease

## 2012-09-10 ENCOUNTER — Encounter: Payer: Self-pay | Admitting: Pulmonary Disease

## 2012-09-10 ENCOUNTER — Ambulatory Visit (INDEPENDENT_AMBULATORY_CARE_PROVIDER_SITE_OTHER): Payer: Medicare Other | Admitting: Pulmonary Disease

## 2012-09-10 VITALS — BP 144/70 | HR 70 | Temp 99.3°F | Wt 275.0 lb

## 2012-09-10 DIAGNOSIS — G4733 Obstructive sleep apnea (adult) (pediatric): Secondary | ICD-10-CM

## 2012-09-10 NOTE — Assessment & Plan Note (Signed)
The patient is doing well with CPAP, and continues to feel that it is helping her sleep significantly.  She is having no mask or pressure issues.  I have asked her to continue on her device, and to keep up with the mask changes and supplies.  I've also encouraged her to work aggressively on weight loss, as she has gained 20 pounds since her last visit.  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

## 2012-09-10 NOTE — Progress Notes (Signed)
  Subjective:    Patient ID: ADI SEALES, female    DOB: 1941-02-09, 72 y.o.   MRN: [id number]  HPI The patient comes in today for followup of her obstructive sleep apnea.  She is wearing CPAP compliantly, and is having no issues with her mask fit or pressure.  She feels that she is sleeping well with the device, and her husband has not heard breakthrough snoring or respiratory events.  She feels rested in the mornings upon arising, and has excellent daytime alertness.  She is still using oxygen through her CPAP machine at night.   Review of Systems  Constitutional: Positive for fatigue. Negative for fever and unexpected weight change.  HENT: Positive for rhinorrhea, sneezing and postnasal drip. Negative for ear pain, nosebleeds, congestion, sore throat, trouble swallowing, dental problem and sinus pressure.   Eyes: Negative for redness and itching.  Respiratory: Positive for shortness of breath. Negative for cough, chest tightness and wheezing.   Cardiovascular: Negative for palpitations and leg swelling.  Gastrointestinal: Negative for nausea and vomiting.  Genitourinary: Negative for dysuria.  Musculoskeletal: Negative for joint swelling.  Skin: Negative for rash.  Neurological: Negative for headaches.  Hematological: Does not bruise/bleed easily.  Psychiatric/Behavioral: Negative for dysphoric mood. The patient is not nervous/anxious.        Objective:   Physical Exam Morbidly obese female in no acute distress Nose without purulent or discharge noted No skin breakdown or pressure necrosis from the CPAP mask Neck without lymphadenopathy or thyromegaly Extremities with mild edema, no cyanosis Alert and oriented, moves all 4 extremities.  Does not appear to be sleepy.        Assessment & Plan:

## 2012-09-10 NOTE — Patient Instructions (Addendum)
Stay on cpap, and keep up with mask changes and supplies. Work on [employer] loss Will get you referred to a new medical equipment company followup with me in one year if doing well.

## 2012-09-17 ENCOUNTER — Ambulatory Visit (INDEPENDENT_AMBULATORY_CARE_PROVIDER_SITE_OTHER): Payer: Medicare Other | Admitting: *Deleted

## 2012-09-17 DIAGNOSIS — I4891 Unspecified atrial fibrillation: Secondary | ICD-10-CM | POA: Diagnosis not present

## 2012-09-17 DIAGNOSIS — I238 Other current complications following acute myocardial infarction: Secondary | ICD-10-CM

## 2012-09-17 DIAGNOSIS — I6350 Cerebral infarction due to unspecified occlusion or stenosis of unspecified cerebral artery: Secondary | ICD-10-CM

## 2012-09-23 ENCOUNTER — Telehealth: Payer: Self-pay | Admitting: *Deleted

## 2012-09-23 NOTE — Telephone Encounter (Signed)
OK to change Carvedilol to 12.5 mg 2 tabs po bid, disp 30 day supply with 3 rf

## 2012-09-23 NOTE — Telephone Encounter (Signed)
[facility] faxed a request to change patient's carvedilol 25mg  to 12.5mg  4 po bid. Carvedilol 25mg  is on back order. Please advise?

## 2012-09-24 ENCOUNTER — Other Ambulatory Visit: Payer: Self-pay | Admitting: Family

## 2012-09-24 NOTE — Telephone Encounter (Signed)
Rx sent in to pharmacy. 

## 2012-09-25 ENCOUNTER — Telehealth: Payer: Self-pay | Admitting: *Deleted

## 2012-09-25 ENCOUNTER — Ambulatory Visit (INDEPENDENT_AMBULATORY_CARE_PROVIDER_SITE_OTHER): Payer: Medicare Other | Admitting: Internal Medicine

## 2012-09-25 ENCOUNTER — Encounter: Payer: Self-pay | Admitting: Internal Medicine

## 2012-09-25 VITALS — BP 132/58 | HR 62 | Ht 64.0 in | Wt 268.2 lb

## 2012-09-25 DIAGNOSIS — I5033 Acute on chronic diastolic (congestive) heart failure: Secondary | ICD-10-CM

## 2012-09-25 DIAGNOSIS — I509 Heart failure, unspecified: Secondary | ICD-10-CM

## 2012-09-25 DIAGNOSIS — I4891 Unspecified atrial fibrillation: Secondary | ICD-10-CM | POA: Diagnosis not present

## 2012-09-25 DIAGNOSIS — I5042 Chronic combined systolic (congestive) and diastolic (congestive) heart failure: Secondary | ICD-10-CM | POA: Diagnosis not present

## 2012-09-25 DIAGNOSIS — I2589 Other forms of chronic ischemic heart disease: Secondary | ICD-10-CM

## 2012-09-25 DIAGNOSIS — I255 Ischemic cardiomyopathy: Secondary | ICD-10-CM

## 2012-09-25 DIAGNOSIS — Z95810 Presence of automatic (implantable) cardiac defibrillator: Secondary | ICD-10-CM

## 2012-09-25 LAB — ICD DEVICE OBSERVATION
AL IMPEDENCE ICD: 337.5 Ohm
ATRIAL PACING ICD: 68 pct
BAMS-0001: 180 {beats}/min
LV LEAD IMPEDENCE ICD: 312.5 Ohm
LV LEAD THRESHOLD: 1.125 V
TOT-0006: 20120426000000
TOT-0007: 1
TOT-0008: 0
TZAT-0001SLOWVT: 1
TZAT-0004FASTVT: 8
TZAT-0013FASTVT: 2
TZAT-0018FASTVT: NEGATIVE
TZON-0003FASTVT: 310 ms
TZON-0010FASTVT: 40 ms
TZON-0010SLOWVT: 40 ms
TZST-0001FASTVT: 3
TZST-0001FASTVT: 5
TZST-0001SLOWVT: 2
TZST-0001SLOWVT: 4
TZST-0001SLOWVT: 5
TZST-0003FASTVT: 40 J
TZST-0003SLOWVT: 15 J
TZST-0003SLOWVT: 40 J
VENTRICULAR PACING ICD: 99.31 pct

## 2012-09-25 NOTE — Patient Instructions (Addendum)
Your physician wants you to follow-up in: 12 months with Dr Taylor You will receive a reminder letter in the mail two months in advance. If you don't receive a letter, please call our office to schedule the follow-up appointment.   Remote monitoring is used to monitor your Pacemaker of ICD from home. This monitoring reduces the number of office visits required to check your device to one time per year. It allows us to keep an eye on the functioning of your device to ensure it is working properly. You are scheduled for a device check from home on 12/29/12. You may send your transmission at any time that day. If you have a wireless device, the transmission will be sent automatically. After your physician reviews your transmission, you will receive a postcard with your next transmission date.   

## 2012-09-25 NOTE — Assessment & Plan Note (Signed)
Her chronic systolic heart failure symptoms remain class II. No change in medical therapy. I've encouraged the patient to reduce her sodium intake. In addition, she is instructed to a less and try to lose weight.

## 2012-09-25 NOTE — Progress Notes (Signed)
HPI Destiny [name] returns today for followup. She is a very pleasant 72 year old woman with an ischemic cardiomyopathy, chronic systolic heart failure, left bundle branch block, status post biventricular ICD implantation. In the interim she has been stable. She does have morbid obesity and hypertension. She notes mild peripheral edema. She is been unable to lose weight. No syncope. Allergies  Allergen Reactions  . Sulfonamide Derivatives Other (See Comments)    Unknown; "childhood allergy/mother"     Current Outpatient Prescriptions  Medication Sig Dispense Refill  . allopurinol (ZYLOPRIM) 100 MG tablet Take 100 mg by mouth daily.      Marland Kitchen amLODipine (NORVASC) 5 MG tablet Take 1 tablet (5 mg total) by mouth daily.  30 tablet  3  . aspirin EC 81 MG tablet Take 81 mg by mouth daily.      . Calcium 600-200 MG-UNIT per tablet Take 1 tablet by mouth daily.        . carvedilol (COREG) 25 MG tablet Take 2 tablets by mouth twice a day  120 tablet  3  . citalopram (CELEXA) 20 MG tablet Take 1 tablet (20 mg total) by mouth daily.  30 tablet  6  . enalapril (VASOTEC) 10 MG tablet Take 1 tablet (10 mg total) by mouth daily.  30 tablet  5  . gabapentin (NEURONTIN) 100 MG capsule Take 1 capsule (100 mg total) by mouth 2 (two) times daily.  60 capsule  3  . insulin NPH (HUMULIN N,NOVOLIN N) 100 UNIT/ML injection Inject 40-50 Units into the skin 2 (two) times daily. Inject 45 units every morning and inject 30 units every night.      . insulin regular (NOVOLIN R,HUMULIN R) 100 units/mL injection Inject 20-30 Units into the skin 2 (two) times daily before a meal. Inject 30 units every morning and inject 20 units every night.      Boris Lown Oil CAPS MegaRed caps daily by Schiff      . levothyroxine (SYNTHROID, LEVOTHROID) 25 MCG tablet Take 1 tablet (25 mcg total) by mouth daily.  90 tablet  1  . LORazepam (ATIVAN) 0.5 MG tablet Take 1 tablet (0.5 mg total) by mouth at bedtime.  30 tablet  5  . Multiple  Vitamins-Minerals (MULTIVITAMIN WITH MINERALS) tablet Take 1 tablet by mouth daily.        . rosuvastatin (CRESTOR) 10 MG tablet Take 1 tablet (10 mg total) by mouth daily.  30 tablet  6  . torsemide (DEMADEX) 20 MG tablet Take 20 mg by mouth 2 (two) times daily.      Marland Kitchen warfarin (COUMADIN) 5 MG tablet Take 5-7.5 mg by mouth See admin instructions. Takes 1.5 tablets (7.5mg ) on Tues, Thursday and Sunday. All other days takes 5mg .       No current facility-administered medications for this visit.     Past Medical History  Diagnosis Date  . LBBB (left bundle branch block)   . Carotid bruit 10/19/2011    a. 10/19/2011 carotid duplex - Mild hard plaque bilaterally. Stable 40-59% bilateral ICA stenosis.   Marland Kitchen Dyslipidemia   . Hypertension   . Chronic diastolic CHF (congestive heart failure)     Diastolic heart failure but with history of variable ejection fractions most recent ejection fraction normal mild LVH June 2012 status post recent hospitalization in June for heart failure with volume overload  . Back pain, chronic     "just when I walk; mass on 3rd and 4th vertebrae right lower back"  . Cardiomyopathy,  ischemic     05/2001 - s/p CABG x4, LIMA --> LAD, saphenous vein --> first diagonal artery, saphenous vein --> 2nd diagonal ,  saphenous vein --> RCA  . History of recurrent UTIs   . 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 CRI (chronic renal insufficiency)   . Biventricular implantable cardiac defibrillator in situ 2007, 2012    s/p device revision secondary to end of life  . Hypoxemia requiring supplemental oxygen   . Candidiasis of vulva and vagina   . Hypothyroidism   . 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 Truett Perna, in remission  . Pneumonia 12/27/11    "3 times; it's been a long time ago"  . OSA on CPAP   . Gout attack ~ 08/2011  . PAF (paroxysmal atrial fibrillation)     on Coumadin  . DM (diabetes mellitus), type 2  with complications     insulin dependent, retinopathy, neuropathy  . Mural thrombus of left ventricle     before 2003, while on Coumadin, No h/o CVA  . Morbid obesity with BMI of 45.0-49.9, adult     Ht. 5'4". BMI 47.2  . Vitamin D deficiency 06/09/2012    historical    ROS:   All systems reviewed and negative except as noted in the HPI.   Past Surgical History  Procedure Laterality Date  . Cardiac catheterization  06/03/01  . Cholecystectomy  1970  . Tubal ligation  1972  . Abdominal hysterectomy  1982  . Insert / replace / remove pacemaker  2007; 2012    w/AICD  . Coronary artery bypass graft  2003    CABG X4  . Cataract extraction      left eye     Family History  Problem Relation Age of Onset  . Cancer Mother     kidney and female repo  . Heart disease Father   . Asthma Mother      History   Social History  . Marital Status: Married    Spouse Name: N/A    Number of Children: Y  . Years of Education: N/A   Occupational History  . 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    Social History Main Topics  . Smoking status: Never Smoker   . Smokeless tobacco: Never Used  . Alcohol Use: No  . Drug Use: No  . Sexually Active: Not Currently   Other Topics Concern  . Not on file   Social History Narrative   Regular Exercise:  No     BP 132/58  Pulse 62  Ht 5\' 4"  (1.626 m)  Wt 268 lb 3.2 oz (121.655 kg)  BMI 46.01 kg/m2  SpO2 89%  Physical Exam:  Well appearing morbidly obese, 72 year old woman, NAD HEENT: Unremarkable Neck:  8 cm JVD, no thyromegally Back:  No CVA tenderness Lungs:  Clear except for basilar rales. No wheezes, no rhonchi. Well-healed ICD incision. HEART:  Regular rate rhythm, no murmurs, no rubs, no clicks Abd:  soft, positive bowel sounds, obese, no organomegally, no rebound, no guarding Ext:  2 plus pulses, no edema, no cyanosis, no clubbing Skin:  No rashes no nodules Neuro:  CN II through XII intact, motor grossly  intact  DEVICE  Normal device function.  See PaceArt for details.   Assess/Plan:

## 2012-09-25 NOTE — Assessment & Plan Note (Signed)
She denies anginal symptoms. I've encouraged the patient to increase her physical activity. No change in medical therapy.

## 2012-09-25 NOTE — Telephone Encounter (Signed)
Patient called concerning rx that was called in to pharmacy [facility] Pharmacy told patient that they had not heard from our office. Pharmacy was called this morning. "See previous message". I called pharmacy back and was told to send patient to [facility] to pick up rx (25 mg). When I called patient her husband said that they only had brand name rx at [facility] and it cost $85. Patient wanted the 12.5 mg 2 po bid called into her [facility] [address]. Called pharmacy back and gave them the rx for carvedilol 12.5 over the phone.

## 2012-09-25 NOTE — Telephone Encounter (Signed)
Called [facility] pharmacy to change medication. Pharmacist stated that they were able to get the 25 mg so there is no need to change dosage. Pharmacist also stated that they informed patient, who was on another line.

## 2012-09-25 NOTE — Assessment & Plan Note (Signed)
Her [employer] biventricular ICD is working normally. She's had no intermittent ventricular arrhythmias. We'll plan to recheck in several months.

## 2012-09-25 NOTE — Telephone Encounter (Signed)
Patient called in regarding this. She states that she is out of carvedilol and has not taking any this morning. She would like a callback on her husbands cell# [phone]. Patient says that you have to dial [phone] and she says that it is okay to either leave a detailed message or talk to her husband about this.

## 2012-09-29 ENCOUNTER — Telehealth: Payer: Self-pay

## 2012-09-29 ENCOUNTER — Other Ambulatory Visit (INDEPENDENT_AMBULATORY_CARE_PROVIDER_SITE_OTHER): Payer: Medicare Other

## 2012-09-29 DIAGNOSIS — E559 Vitamin D deficiency, unspecified: Secondary | ICD-10-CM

## 2012-09-29 DIAGNOSIS — E785 Hyperlipidemia, unspecified: Secondary | ICD-10-CM | POA: Diagnosis not present

## 2012-09-29 DIAGNOSIS — E039 Hypothyroidism, unspecified: Secondary | ICD-10-CM

## 2012-09-29 DIAGNOSIS — G589 Mononeuropathy, unspecified: Secondary | ICD-10-CM | POA: Diagnosis not present

## 2012-09-29 DIAGNOSIS — E1149 Type 2 diabetes mellitus with other diabetic neurological complication: Secondary | ICD-10-CM

## 2012-09-29 DIAGNOSIS — I10 Essential (primary) hypertension: Secondary | ICD-10-CM | POA: Diagnosis not present

## 2012-09-29 LAB — RENAL FUNCTION PANEL
Albumin: 3.5 g/dL (ref 3.5–5.2)
BUN: 47 mg/dL — ABNORMAL HIGH (ref 6–23)
Calcium: 9.3 mg/dL (ref 8.4–10.5)
Chloride: 99 mEq/L (ref 96–112)
Glucose, Bld: 224 mg/dL — ABNORMAL HIGH (ref 70–99)
Potassium: 4.8 mEq/L (ref 3.5–5.1)

## 2012-09-29 LAB — HEPATIC FUNCTION PANEL
ALT: 14 U/L (ref 0–35)
Albumin: 3.5 g/dL (ref 3.5–5.2)
Alkaline Phosphatase: 74 U/L (ref 39–117)
Bilirubin, Direct: 0.1 mg/dL (ref 0.0–0.3)
Total Protein: 7.1 g/dL (ref 6.0–8.3)

## 2012-09-29 LAB — CBC
Hemoglobin: 13.6 g/dL (ref 12.0–15.0)
MCHC: 34 g/dL (ref 30.0–36.0)
Platelets: 183 10*3/uL (ref 150.0–400.0)
RBC: 4.5 Mil/uL (ref 3.87–5.11)

## 2012-09-29 LAB — LIPID PANEL
Cholesterol: 165 mg/dL (ref 0–200)
HDL: 38.3 mg/dL — ABNORMAL LOW (ref >39.00)
VLDL: 78.8 mg/dL — ABNORMAL HIGH (ref 0.0–40.0)

## 2012-09-29 LAB — TSH: TSH: 2.76 u[IU]/mL (ref 0.35–5.50)

## 2012-09-29 NOTE — Telephone Encounter (Signed)
Labs ordered.

## 2012-09-30 LAB — VITAMIN D 25 HYDROXY (VIT D DEFICIENCY, FRACTURES): Vit D, 25-Hydroxy: 22 ng/mL — ABNORMAL LOW (ref 30–89)

## 2012-10-02 ENCOUNTER — Encounter: Payer: Self-pay | Admitting: Family Medicine

## 2012-10-02 ENCOUNTER — Ambulatory Visit (INDEPENDENT_AMBULATORY_CARE_PROVIDER_SITE_OTHER): Payer: Medicare Other | Admitting: Family Medicine

## 2012-10-02 VITALS — BP 130/64 | HR 64 | Temp 98.1°F | Ht 64.0 in | Wt 270.2 lb

## 2012-10-02 DIAGNOSIS — G609 Hereditary and idiopathic neuropathy, unspecified: Secondary | ICD-10-CM

## 2012-10-02 DIAGNOSIS — I10 Essential (primary) hypertension: Secondary | ICD-10-CM | POA: Diagnosis not present

## 2012-10-02 DIAGNOSIS — I4891 Unspecified atrial fibrillation: Secondary | ICD-10-CM

## 2012-10-02 DIAGNOSIS — E1149 Type 2 diabetes mellitus with other diabetic neurological complication: Secondary | ICD-10-CM

## 2012-10-02 DIAGNOSIS — I890 Lymphedema, not elsewhere classified: Secondary | ICD-10-CM

## 2012-10-02 DIAGNOSIS — B379 Candidiasis, unspecified: Secondary | ICD-10-CM

## 2012-10-02 DIAGNOSIS — E559 Vitamin D deficiency, unspecified: Secondary | ICD-10-CM | POA: Diagnosis not present

## 2012-10-02 DIAGNOSIS — E785 Hyperlipidemia, unspecified: Secondary | ICD-10-CM

## 2012-10-02 MED ORDER — VITAMIN D3 1.25 MG (50000 UT) PO CAPS: 1.0000 | ORAL_CAPSULE | ORAL | Status: DC

## 2012-10-02 MED ORDER — CLOTRIMAZOLE 2 % VA CREA: 1.0000 | TOPICAL_CREAM | Freq: Every day | VAGINAL | Status: DC

## 2012-10-02 MED ORDER — GABAPENTIN 300 MG PO CAPS: 300.0000 mg | ORAL_CAPSULE | Freq: Two times a day (BID) | ORAL | Status: DC

## 2012-10-02 NOTE — Patient Instructions (Addendum)
Next visit, gyn labs prior vitamin d, uric acid, tsh, cbc, liver, renal  Vitamin D Deficiency Vitamin D is an important vitamin that your body needs. Having too little of it in your body is called a deficiency. A very bad deficiency can make your bones soft and can cause a condition called rickets.  Vitamin D is important to your body for different reasons, such as:   It helps your body absorb 2 minerals called calcium and phosphorus.  It helps make your bones healthy.  It may prevent some diseases, such as diabetes and multiple sclerosis.  It helps your muscles and heart. You can get vitamin D in several ways. It is a natural part of some foods. The vitamin is also added to some dairy products and cereals. Some people take vitamin D supplements. Also, your body makes vitamin D when you are in the sun. It changes the sun's rays into a form of the vitamin that your body can use. CAUSES   Not eating enough foods that contain vitamin D.  Not getting enough sunlight.  Having certain digestive system diseases that make it hard to absorb vitamin D. These diseases include Crohn's disease, chronic pancreatitis, and cystic fibrosis.  Having a surgery in which part of the stomach or small intestine is removed.  Being obese. Fat cells pull vitamin D out of your blood. That means that obese people may not have enough vitamin D left in their blood and in other body tissues.  Having chronic kidney or liver disease. RISK FACTORS Risk factors are things that make you more likely to develop a vitamin D deficiency. They include:  Being older.  Not being able to get outside very much.  Living in a nursing home.  Having had broken bones.  Having weak or thin bones (osteoporosis).  Having a disease or condition that changes how your body absorbs vitamin D.  Having dark skin.  Some medicines such as seizure medicines or steroids.  Being overweight or obese. SYMPTOMS Mild cases of vitamin D  deficiency may not have any symptoms. If you have a very bad case, symptoms may include:  Bone pain.  Muscle pain.  Falling often.  Broken bones caused by a minor injury, due to osteoporosis. DIAGNOSIS A blood test is the best way to tell if you have a vitamin D deficiency. TREATMENT Vitamin D deficiency can be treated in different ways. Treatment for vitamin D deficiency depends on what is causing it. Options include:  Taking vitamin D supplements.  Taking a calcium supplement. Your caregiver will suggest what dose is best for you. HOME CARE INSTRUCTIONS  Take any supplements that your caregiver prescribes. Follow the directions carefully. Take only the suggested amount.  Have your blood tested 2 months after you start taking supplements.  Eat foods that contain vitamin D. Healthy choices include:  Fortified dairy products, cereals, or juices. Fortified means vitamin D has been added to the food. Check the label on the package to be sure.  Fatty fish like salmon or trout.  Eggs.  Oysters.  Spend some time in the sun. Most people should get out in the sun without sunblock for 10 to 15 minutes at a time. Do this 3 times a week. People who have had skin cancer should not do this. Ask your caregiver how long you should be in the sun. Do not use a tanning bed.  Keep your weight at a healthy level. Lose weight if you need to.  Keep all  follow-up appointments. Your caregiver will need to perform blood tests to make sure your vitamin D deficiency is going away. SEEK MEDICAL CARE IF:  You have any questions about your treatment.  You continue to have symptoms of vitamin D deficiency.  You have nausea or vomiting.  You are constipated.  You feel confused.  You have severe abdominal or back pain. MAKE SURE YOU:  Understand these instructions.  Will watch your condition.  Will get help right away if you are not doing well or get worse. Document Released: 07/23/2011  Document Reviewed: 07/23/2011 [facility] Patient Information 2014 [place], [place].

## 2012-10-07 NOTE — Assessment & Plan Note (Signed)
Avoid trans fats, continue krill oil increase activity as tolerated

## 2012-10-07 NOTE — Assessment & Plan Note (Signed)
Has dropped Torsemide to 1 tab po bid

## 2012-10-07 NOTE — Progress Notes (Signed)
[name], female   DOB: 1940-08-25, 72 y.o.   MRN: [id number] Destiny [name] [id number] August 13, 1940 10/07/2012      Progress Note-Follow Up  Subjective  Chief Complaint  Chief Complaint  Patient presents with  . Follow-up    HPI  Patient is a 72 year old Caucasian female who is in today for followup overall is doing well. Is following with her endocrinologist and notes a recent hemoglobin A1c in the office was 7.2 she is generally doing well she has persistent sensation of tingling and mild numbness in her legs below her knees as well as her hands. Swelling with endocrinology as well as neurosurgery for a long history of nerve damage nothing acute or new. Is Dr. Clint Bolder from one half tablet twice a day to one tablet twice a day if her edema has done well. No recent illness. No chest pain, palpitations, shortness of breath, GI or GU complaints noted today.  Past Medical History  Diagnosis Date  . LBBB (left bundle branch block)   . Carotid bruit 10/19/2011    a. 10/19/2011 carotid duplex - Mild hard plaque bilaterally. Stable 40-59% bilateral ICA stenosis.   Marland Kitchen Dyslipidemia   . Hypertension   . Chronic diastolic CHF (congestive heart failure)     Diastolic heart failure but with history of variable ejection fractions most recent ejection fraction normal mild LVH June 2012 status post recent hospitalization in June for heart failure with volume overload  . Back pain, chronic     "just when I walk; mass on 3rd and 4th vertebrae right lower back"  . Cardiomyopathy, ischemic     05/2001 - s/p CABG x4, LIMA --> LAD, saphenous vein --> first diagonal artery, saphenous vein --> 2nd diagonal ,  saphenous vein --> RCA  . History of recurrent UTIs   . 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 CRI (chronic renal insufficiency)   . Biventricular implantable cardiac defibrillator in situ 2007, 2012    s/p device revision secondary to end of life  . Hypoxemia  requiring supplemental oxygen   . Candidiasis of vulva and vagina   . Hypothyroidism   . 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 Truett Perna, in remission  . Pneumonia 12/27/11    "3 times; it's been a long time ago"  . OSA on CPAP   . Gout attack ~ 08/2011  . PAF (paroxysmal atrial fibrillation)     on Coumadin  . DM (diabetes mellitus), type 2 with complications     insulin dependent, retinopathy, neuropathy  . Mural thrombus of left ventricle     before 2003, while on Coumadin, No h/o CVA  . Morbid obesity with BMI of 45.0-49.9, adult     Ht. 5'4". BMI 47.2  . Vitamin D deficiency 06/09/2012    historical    Past Surgical History  Procedure Laterality Date  . Cardiac catheterization  06/03/01  . Cholecystectomy  1970  . Tubal ligation  1972  . Abdominal hysterectomy  1982  . Insert / replace / remove pacemaker  2007; 2012    w/AICD  . Coronary artery bypass graft  2003    CABG X4  . Cataract extraction      left eye    Family History  Problem Relation Age of Onset  . Cancer Mother     kidney and female repo  . Heart disease Father   . Asthma  Mother     History   Social History  . Marital Status: Married    Spouse Name: N/A    Number of Children: Y  . Years of Education: N/A   Occupational History  . 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    Social History Main Topics  . Smoking status: Never Smoker   . Smokeless tobacco: Never Used  . Alcohol Use: No  . Drug Use: No  . Sexually Active: Not Currently   Other Topics Concern  . Not on file   Social History Narrative   Regular Exercise:  No    Current Outpatient Prescriptions on File Prior to Visit  Medication Sig Dispense Refill  . allopurinol (ZYLOPRIM) 100 MG tablet Take 100 mg by mouth daily.      Marland Kitchen amLODipine (NORVASC) 5 MG tablet Take 1 tablet (5 mg total) by mouth daily.  30 tablet  3  . aspirin EC 81 MG tablet Take 81 mg by mouth daily.      . carvedilol  (COREG) 25 MG tablet Take 2 tablets by mouth twice a day  120 tablet  3  . citalopram (CELEXA) 20 MG tablet Take 1 tablet (20 mg total) by mouth daily.  30 tablet  6  . enalapril (VASOTEC) 10 MG tablet Take 1 tablet (10 mg total) by mouth daily.  30 tablet  5  . insulin NPH (HUMULIN N,NOVOLIN N) 100 UNIT/ML injection Inject 40-50 Units into the skin 2 (two) times daily. Inject 45 units every morning and inject 30 units every night.      . insulin regular (NOVOLIN R,HUMULIN R) 100 units/mL injection Inject 20-30 Units into the skin 2 (two) times daily before a meal. Inject 30 units every morning and inject 20 units every night.      Boris Lown Oil CAPS MegaRed caps daily by Schiff      . levothyroxine (SYNTHROID, LEVOTHROID) 25 MCG tablet Take 1 tablet (25 mcg total) by mouth daily.  90 tablet  1  . LORazepam (ATIVAN) 0.5 MG tablet Take 1 tablet (0.5 mg total) by mouth at bedtime.  30 tablet  5  . Multiple Vitamins-Minerals (MULTIVITAMIN WITH MINERALS) tablet Take 1 tablet by mouth daily.        . rosuvastatin (CRESTOR) 10 MG tablet Take 1 tablet (10 mg total) by mouth daily.  30 tablet  6  . torsemide (DEMADEX) 20 MG tablet Take 20 mg by mouth 2 (two) times daily.      Marland Kitchen warfarin (COUMADIN) 5 MG tablet Take 5-7.5 mg by mouth See admin instructions. Takes 1.5 tablets (7.5mg ) on Tues, Thursday and Sunday. All other days takes 5mg .      . Calcium 600-200 MG-UNIT per tablet Take 1 tablet by mouth daily.         No current facility-administered medications on file prior to visit.    Allergies  Allergen Reactions  . Sulfonamide Derivatives Other (See Comments)    Unknown; "childhood allergy/mother"    Review of Systems  Review of Systems  Constitutional: Negative for fever and malaise/fatigue.  HENT: Negative for congestion.   Eyes: Negative for discharge.  Respiratory: Negative for shortness of breath.   Cardiovascular: Positive for leg swelling. Negative for chest pain and palpitations.   Gastrointestinal: Negative for nausea, abdominal pain and diarrhea.  Genitourinary: Negative for dysuria.  Musculoskeletal: Negative for falls.  Skin: Negative for rash.  Neurological: Positive for tingling. Negative for loss of consciousness and  headaches.  Endo/Heme/Allergies: Negative for polydipsia.  Psychiatric/Behavioral: Negative for depression and suicidal ideas. The patient is not nervous/anxious and does not have insomnia.     Objective  BP 130/64  Pulse 64  Temp(Src) 98.1 F (36.7 C) (Oral)  Ht 5\' 4"  (1.626 m)  Wt 270 lb 4 oz (122.585 kg)  BMI 46.37 kg/m2  SpO2 88%  Physical Exam  Physical Exam  Constitutional: She is oriented to person, place, and time and well-developed, well-nourished, and in no distress. No distress.  HENT:  Head: Normocephalic and atraumatic.  Eyes: Conjunctivae are normal.  Neck: Neck supple. No thyromegaly present.  Cardiovascular: Normal rate, regular rhythm and normal heart sounds.   Pulmonary/Chest: Effort normal and breath sounds normal. She has no wheezes.  Abdominal: She exhibits no distension and no mass.  Musculoskeletal: She exhibits no edema.  Lymphadenopathy:    She has no cervical adenopathy.  Neurological: She is alert and oriented to person, place, and time.  Skin: Skin is warm and dry. No rash noted. She is not diaphoretic.  Psychiatric: Memory, affect and judgment normal.    Lab Results  Component Value Date   TSH 2.76 09/29/2012   Lab Results  Component Value Date   WBC 7.1 09/29/2012   HGB 13.6 09/29/2012   HCT 40.2 09/29/2012   MCV 89.4 09/29/2012   PLT 183.0 09/29/2012   Lab Results  Component Value Date   CREATININE 1.4* 09/29/2012   BUN 47* 09/29/2012   NA 137 09/29/2012   K 4.8 09/29/2012   CL 99 09/29/2012   CO2 28 09/29/2012   Lab Results  Component Value Date   ALT 14 09/29/2012   AST 17 09/29/2012   ALKPHOS 74 09/29/2012   BILITOT 0.5 09/29/2012   Lab Results  Component Value Date   CHOL 165 09/29/2012    Lab Results  Component Value Date   HDL 38.30* 09/29/2012   Lab Results  Component Value Date   LDLCALC  Value: UNABLE TO CALCULATE IF TRIGLYCERIDE OVER 400 mg/dL        Total Cholesterol/HDL:CHD Risk Coronary Heart Disease Risk Table                     Men   Women  1/2 Average Risk   3.4   3.3  Average Risk       5.0   4.4  2 X Average Risk   9.6   7.1  3 X Average Risk  23.4   11.0        Use the calculated Patient Ratio above and the CHD Risk Table to determine the patient's CHD Risk.        ATP III CLASSIFICATION (LDL):  <100     mg/dL   Optimal  161-096  mg/dL   Near or Above                    Optimal  130-159  mg/dL   Borderline  045-409  mg/dL   High  >811     mg/dL   Very High 01/12/4781   Lab Results  Component Value Date   TRIG 394.0* 09/29/2012   Lab Results  Component Value Date   CHOLHDL 4 09/29/2012     Assessment & Plan2  HYPERTENSION Well controlled, no changes today  DIABETES MELLITUS, WITH AUTONOMIC NEUROPATHY hgba1c 7.2 with endocrinology per patient, recently, encouraged decrease simple carbs  ATRIAL FIBRILLATION, PAROXYSMAL RRR today  DYSLIPIDEMIA Avoid trans fats,  continue krill oil increase activity as tolerated  LYMPHEDEMA Has dropped Torsemide to 1 tab po bid

## 2012-10-07 NOTE — Assessment & Plan Note (Signed)
hgba1c 7.2 with endocrinology per patient, recently, encouraged decrease simple carbs

## 2012-10-07 NOTE — Assessment & Plan Note (Signed)
Well controlled, no changes today 

## 2012-10-07 NOTE — Assessment & Plan Note (Signed)
RRR today 

## 2012-10-08 ENCOUNTER — Ambulatory Visit (INDEPENDENT_AMBULATORY_CARE_PROVIDER_SITE_OTHER): Payer: Medicare Other | Admitting: Pharmacist

## 2012-10-08 DIAGNOSIS — I6350 Cerebral infarction due to unspecified occlusion or stenosis of unspecified cerebral artery: Secondary | ICD-10-CM

## 2012-10-08 DIAGNOSIS — I4891 Unspecified atrial fibrillation: Secondary | ICD-10-CM

## 2012-10-08 DIAGNOSIS — I238 Other current complications following acute myocardial infarction: Secondary | ICD-10-CM

## 2012-10-09 ENCOUNTER — Other Ambulatory Visit: Payer: Self-pay | Admitting: Internal Medicine

## 2012-10-09 NOTE — Telephone Encounter (Signed)
Refill request for Ativan Last filled by MD on -04/02/13 #30 x5 Last seen-10/02/12 F/U-01/02/13 Please advise refill?

## 2012-10-13 ENCOUNTER — Telehealth: Payer: Self-pay

## 2012-10-13 MED ORDER — VITAMIN D (ERGOCALCIFEROL) 1.25 MG (50000 UNIT) PO CAPS: 50000.0000 [IU] | ORAL_CAPSULE | ORAL | Status: DC

## 2012-10-13 NOTE — Telephone Encounter (Signed)
Pt called stating she needed Vitamin d 16109 sent to the pharmacy

## 2012-10-15 ENCOUNTER — Other Ambulatory Visit: Payer: Self-pay | Admitting: Internal Medicine

## 2012-10-17 ENCOUNTER — Ambulatory Visit (HOSPITAL_COMMUNITY)
Admission: RE | Admit: 2012-10-17 | Discharge: 2012-10-17 | Disposition: A | Discharge status: Home / Self Care | Payer: Medicare Other | Source: Ambulatory Visit | Attending: Cardiology | Admitting: Cardiology

## 2012-10-17 ENCOUNTER — Other Ambulatory Visit (HOSPITAL_COMMUNITY): Payer: Self-pay | Admitting: Orthopedic Surgery

## 2012-10-17 DIAGNOSIS — M7989 Other specified soft tissue disorders: Secondary | ICD-10-CM

## 2012-10-17 DIAGNOSIS — S838X9A Sprain of other specified parts of unspecified knee, initial encounter: Secondary | ICD-10-CM | POA: Diagnosis not present

## 2012-10-17 DIAGNOSIS — S86819A Strain of other muscle(s) and tendon(s) at lower leg level, unspecified leg, initial encounter: Secondary | ICD-10-CM | POA: Diagnosis not present

## 2012-10-17 DIAGNOSIS — M25561 Pain in right knee: Secondary | ICD-10-CM

## 2012-10-17 DIAGNOSIS — M25569 Pain in unspecified knee: Secondary | ICD-10-CM | POA: Insufficient documentation

## 2012-10-17 NOTE — Progress Notes (Signed)
Venous Duplex Completed. Negative. Erlene Quan

## 2012-10-20 ENCOUNTER — Ambulatory Visit
Admission: RE | Admit: 2012-10-20 | Discharge: 2012-10-20 | Disposition: A | Discharge status: Home / Self Care | Payer: Medicare Other | Source: Ambulatory Visit | Attending: Orthopedic Surgery | Admitting: Orthopedic Surgery

## 2012-10-20 ENCOUNTER — Other Ambulatory Visit: Payer: Self-pay | Admitting: Orthopedic Surgery

## 2012-10-20 DIAGNOSIS — S86819A Strain of other muscle(s) and tendon(s) at lower leg level, unspecified leg, initial encounter: Secondary | ICD-10-CM

## 2012-10-20 DIAGNOSIS — M7989 Other specified soft tissue disorders: Secondary | ICD-10-CM | POA: Diagnosis not present

## 2012-10-24 ENCOUNTER — Telehealth: Payer: Self-pay | Admitting: Family Medicine

## 2012-10-24 DIAGNOSIS — I5042 Chronic combined systolic (congestive) and diastolic (congestive) heart failure: Secondary | ICD-10-CM

## 2012-10-24 MED ORDER — TORSEMIDE 20 MG PO TABS: 20.0000 mg | ORAL_TABLET | Freq: Two times a day (BID) | ORAL | Status: DC

## 2012-10-24 NOTE — Telephone Encounter (Signed)
Refill- torsemide 20mg  tab. Take one and one-half tablets by mouth twice daily. Qty 90 last fill 2.21.14

## 2012-10-29 ENCOUNTER — Ambulatory Visit (INDEPENDENT_AMBULATORY_CARE_PROVIDER_SITE_OTHER): Payer: Medicare Other | Admitting: *Deleted

## 2012-10-29 DIAGNOSIS — I4891 Unspecified atrial fibrillation: Secondary | ICD-10-CM | POA: Diagnosis not present

## 2012-10-29 DIAGNOSIS — I238 Other current complications following acute myocardial infarction: Secondary | ICD-10-CM | POA: Diagnosis not present

## 2012-10-29 DIAGNOSIS — I6350 Cerebral infarction due to unspecified occlusion or stenosis of unspecified cerebral artery: Secondary | ICD-10-CM

## 2012-10-29 LAB — POCT INR: INR: 2.2

## 2012-11-05 ENCOUNTER — Other Ambulatory Visit: Payer: Self-pay | Admitting: Family Medicine

## 2012-11-05 MED ORDER — LORAZEPAM 0.5 MG PO TABS: 0.5000 mg | ORAL_TABLET | Freq: Every day | ORAL | Status: DC

## 2012-11-05 NOTE — Telephone Encounter (Signed)
Please advise refill? Last RX was done on 10-09-12 quantity 30 with 0 refills  If ok fax to [phone]

## 2012-11-05 NOTE — Telephone Encounter (Signed)
Refill-ativan 0.5mg  tab. Take one tablet by mouth once daily at bedtime. Qty 30 last fill 5.29.14

## 2012-11-11 ENCOUNTER — Encounter: Payer: Self-pay | Admitting: Internal Medicine

## 2012-11-20 ENCOUNTER — Other Ambulatory Visit: Payer: Self-pay

## 2012-11-20 DIAGNOSIS — Z961 Presence of intraocular lens: Secondary | ICD-10-CM | POA: Diagnosis not present

## 2012-11-20 DIAGNOSIS — E1139 Type 2 diabetes mellitus with other diabetic ophthalmic complication: Secondary | ICD-10-CM | POA: Diagnosis not present

## 2012-11-20 DIAGNOSIS — E11319 Type 2 diabetes mellitus with unspecified diabetic retinopathy without macular edema: Secondary | ICD-10-CM | POA: Diagnosis not present

## 2012-11-20 DIAGNOSIS — H35379 Puckering of macula, unspecified eye: Secondary | ICD-10-CM | POA: Diagnosis not present

## 2012-11-26 ENCOUNTER — Ambulatory Visit (INDEPENDENT_AMBULATORY_CARE_PROVIDER_SITE_OTHER): Payer: Medicare Other | Admitting: *Deleted

## 2012-11-26 DIAGNOSIS — I6350 Cerebral infarction due to unspecified occlusion or stenosis of unspecified cerebral artery: Secondary | ICD-10-CM

## 2012-11-26 DIAGNOSIS — I4891 Unspecified atrial fibrillation: Secondary | ICD-10-CM

## 2012-11-26 DIAGNOSIS — I238 Other current complications following acute myocardial infarction: Secondary | ICD-10-CM

## 2012-11-26 LAB — POCT INR: INR: 2.5

## 2012-11-28 ENCOUNTER — Telehealth: Payer: Self-pay

## 2012-11-28 NOTE — Telephone Encounter (Signed)
Pt called stating that she received a letter from our office and she thought Dr Abner Greenspan was under the understanding that she saw Dr Deveron Furlong for her diabetes and Dr Randon Goldsmith for her eyes.   I updated pt that it looked like this was a computer generated letter but I would add this information to her chart

## 2012-12-05 ENCOUNTER — Telehealth: Payer: Self-pay | Admitting: *Deleted

## 2012-12-05 MED ORDER — LORAZEPAM 0.5 MG PO TABS: 0.5000 mg | ORAL_TABLET | Freq: Every day | ORAL | Status: DC

## 2012-12-05 NOTE — Telephone Encounter (Signed)
Faxed refill request received from pharmacy for Lorazepam 0.5 mg Last filled by MD on 06.25.14, #30x0 Last AEX - 05.22.14 Next AEX - 3-Months Please advise on refills/SLS

## 2012-12-05 NOTE — Telephone Encounter (Signed)
OK to refill Lorazepam with same strength, same sig, same number and 1 refill

## 2012-12-17 ENCOUNTER — Telehealth: Payer: Self-pay | Admitting: Family Medicine

## 2012-12-17 DIAGNOSIS — I10 Essential (primary) hypertension: Secondary | ICD-10-CM

## 2012-12-17 DIAGNOSIS — N289 Disorder of kidney and ureter, unspecified: Secondary | ICD-10-CM

## 2012-12-17 DIAGNOSIS — E1149 Type 2 diabetes mellitus with other diabetic neurological complication: Secondary | ICD-10-CM

## 2012-12-17 DIAGNOSIS — E039 Hypothyroidism, unspecified: Secondary | ICD-10-CM

## 2012-12-17 DIAGNOSIS — E559 Vitamin D deficiency, unspecified: Secondary | ICD-10-CM

## 2012-12-17 DIAGNOSIS — E785 Hyperlipidemia, unspecified: Secondary | ICD-10-CM

## 2012-12-17 DIAGNOSIS — E1139 Type 2 diabetes mellitus with other diabetic ophthalmic complication: Secondary | ICD-10-CM

## 2012-12-17 NOTE — Telephone Encounter (Signed)
Lab order placed.

## 2012-12-17 NOTE — Telephone Encounter (Signed)
° °   Next visit, gyn labs prior vitamin d, uric acid, tsh, cbc, liver, renal    Patient has appointment on 12/26/12 and will go to the [facility] lab prior to this appointment. Also, patient is scheduled for 15 follow up visit and I explained to patient that Dr. Abner Greenspan wanted her to have a GYN appointment. Patient does not want GYN appointment, only follow up.

## 2012-12-22 ENCOUNTER — Other Ambulatory Visit: Payer: Medicare Other

## 2012-12-22 DIAGNOSIS — N289 Disorder of kidney and ureter, unspecified: Secondary | ICD-10-CM

## 2012-12-22 DIAGNOSIS — E1149 Type 2 diabetes mellitus with other diabetic neurological complication: Secondary | ICD-10-CM

## 2012-12-22 DIAGNOSIS — E559 Vitamin D deficiency, unspecified: Secondary | ICD-10-CM

## 2012-12-22 DIAGNOSIS — E039 Hypothyroidism, unspecified: Secondary | ICD-10-CM

## 2012-12-22 DIAGNOSIS — I10 Essential (primary) hypertension: Secondary | ICD-10-CM

## 2012-12-22 LAB — RENAL FUNCTION PANEL
BUN: 62 mg/dL — ABNORMAL HIGH (ref 6–23)
Calcium: 9.2 mg/dL (ref 8.4–10.5)
Creatinine, Ser: 1.7 mg/dL — ABNORMAL HIGH (ref 0.4–1.2)
Glucose, Bld: 201 mg/dL — ABNORMAL HIGH (ref 70–99)
Sodium: 138 mEq/L (ref 135–145)

## 2012-12-22 LAB — CBC
HCT: 38.4 % (ref 36.0–46.0)
Hemoglobin: 12.7 g/dL (ref 12.0–15.0)
Platelets: 190 10*3/uL (ref 150.0–400.0)
RBC: 4.2 Mil/uL (ref 3.87–5.11)
WBC: 7.9 10*3/uL (ref 4.5–10.5)

## 2012-12-22 LAB — HEPATIC FUNCTION PANEL
ALT: 14 U/L (ref 0–35)
Albumin: 3.5 g/dL (ref 3.5–5.2)
Total Bilirubin: 0.5 mg/dL (ref 0.3–1.2)

## 2012-12-26 ENCOUNTER — Ambulatory Visit (INDEPENDENT_AMBULATORY_CARE_PROVIDER_SITE_OTHER): Payer: Medicare Other | Admitting: Family Medicine

## 2012-12-26 ENCOUNTER — Encounter: Payer: Self-pay | Admitting: Family Medicine

## 2012-12-26 VITALS — BP 120/60 | HR 64 | Temp 98.2°F | Ht 64.0 in | Wt 273.0 lb

## 2012-12-26 DIAGNOSIS — I10 Essential (primary) hypertension: Secondary | ICD-10-CM

## 2012-12-26 DIAGNOSIS — M7021 Olecranon bursitis, right elbow: Secondary | ICD-10-CM

## 2012-12-26 DIAGNOSIS — E785 Hyperlipidemia, unspecified: Secondary | ICD-10-CM

## 2012-12-26 DIAGNOSIS — R52 Pain, unspecified: Secondary | ICD-10-CM | POA: Diagnosis not present

## 2012-12-26 DIAGNOSIS — J45909 Unspecified asthma, uncomplicated: Secondary | ICD-10-CM

## 2012-12-26 DIAGNOSIS — E559 Vitamin D deficiency, unspecified: Secondary | ICD-10-CM

## 2012-12-26 DIAGNOSIS — M7020 Olecranon bursitis, unspecified elbow: Secondary | ICD-10-CM

## 2012-12-26 DIAGNOSIS — G4733 Obstructive sleep apnea (adult) (pediatric): Secondary | ICD-10-CM

## 2012-12-26 MED ORDER — VITAMIN D (ERGOCALCIFEROL) 1.25 MG (50000 UNIT) PO CAPS: 50000.0000 [IU] | ORAL_CAPSULE | ORAL | Status: DC

## 2012-12-26 MED ORDER — INSULIN NPH (HUMAN) (ISOPHANE) 100 UNIT/ML ~~LOC~~ SUSP: 40.0000 [IU] | Freq: Two times a day (BID) | SUBCUTANEOUS | Status: DC

## 2012-12-26 MED ORDER — TRAMADOL HCL 50 MG PO TABS: 50.0000 mg | ORAL_TABLET | Freq: Two times a day (BID) | ORAL | Status: DC | PRN

## 2012-12-26 MED ORDER — ALBUTEROL SULFATE HFA 108 (90 BASE) MCG/ACT IN AERS: 2.0000 | INHALATION_SPRAY | Freq: Four times a day (QID) | RESPIRATORY_TRACT | Status: AC | PRN

## 2012-12-26 MED ORDER — INSULIN REGULAR HUMAN 100 UNIT/ML IJ SOLN: 20.0000 [IU] | Freq: Two times a day (BID) | INTRAMUSCULAR | Status: DC

## 2012-12-26 NOTE — Patient Instructions (Addendum)
Asthma, Adult Asthma is a condition that affects your lungs. It is characterized by swelling and narrowing of your airways as well as increased mucus production. The narrowing comes from swelling and muscle spasms inside the airways. When this happens, breathing can be difficult and you can have coughing, wheezing, and shortness of breath. Knowing more about asthma can help you manage it better. Asthma cannot be cured, but medicines and lifestyle changes can help control it. Asthma can be a minor problem for some people but if it is not controlled it can lead to a life-threatening asthma attack. Asthma can change over time. It is important to work with your caregiver to manage your asthma symptoms. CAUSES The exact cause of asthma is unknown. Asthma is believed to be caused by inherited (genetic) and environmental exposures. Swelling and redness (inflammation) of the airways occurs in asthma. This can be triggered by allergies, viral lung infections, or irritants in the air. Allergic reactions can cause you to wheeze immediately or several hours after an exposure. Asthma triggers are different for each person. It is important to pay attention and know what triggers your asthma.  Common triggers for asthma attacks include:  Animal dander from the skin, hair, or feathers of animals.  Dust mites contained in house dust.  Cockroaches.  Pollen from trees or grass.  Mold.  Cigarette or tobacco smoke. Smoking cannot be allowed in homes of people with asthma. People with asthma should not smoke and should not be around smokers.  Air pollutants such as dust, household cleaners, hair sprays, aerosol sprays, paint fumes, strong chemicals, or strong odors.  Cold air or weather changes. Cold air may cause inflammation. Winds increase molds and pollens in the air. There is not one best climate for people with asthma.  Strong emotions such as crying or laughing hard.  Stress.  Certain medicines such as  aspirin or beta-blockers.  Sulfites in such foods and drinks as dried fruits and wine.  Infections or inflammatory conditions such as the flu, a cold, or an inflammation of the nasal membranes (rhinitis).  Gastroesophageal reflux disease (GERD). GERD is a condition where stomach acid backs up into your throat (esophagus).  Exercise or strenous activity. Proper pre-exercise medicines allow most people to participate in sports. SYMPTOMS  Feeling short of breath.  Chest tightness or pain.  Difficulty sleeping due to coughing, wheezing, or feeling short of breath.  A whistling or wheezing sound with exhalation.  Coughing or wheezing that is worse when you:  Have a virus (such as a cold or the flu).  Are suffering from allergies.  Are exposed to certain fumes or chemicals.  Exercise. Signs that your asthma is probably getting worse include:   More frequent and bothersome asthma signs and symptoms.  Increasing difficulty breathing. This can be measured by a peak flow meter, which is a simple device used to check how well your lungs are working.  An increasingly frequent need to use a quick-relief inhaler. DIAGNOSIS  The diagnosis of asthma is made by review of your medical history, a physical exam, and possibly from other tests. Lung function studies may help with the diagnosis. TREATMENT  Asthma cannot be cured. However, for the majority of adults, asthma can be controlled with treatment. Besides avoidance of triggers of your asthma, medicines are often required. There are 2 classes of medicine used for asthma treatment: controller medicines (reduce inflammation and symptoms) andreliever or rescue medicines (relieve asthma symptoms during acute attacks). You may require daily   medicines to control your asthma. The most effective long-term controller medicines for asthma are inhaled corticosteroids (blocks inflammation). Other long-term control medicines include:  Leukotriene  receptor antagonists (blocks a pathway of inflammation).  Long-acting beta2-agonists (relaxes the muscles of the airways for at least 12 hours) with an inhaled corticosteroid.  Cromolyn sodium or nedocromil (alters certain inflammatory cells' ability to release chemicals that cause inflammation).  Immunomodulators (alters the immune system to prevent asthma symptoms).  Theophylline (relaxes muscles in the airways). You may also require a short-acting beta2-agonist to relieve asthma symptoms during an acute attack. You should understand what to do during an acute attack. Inhaled medicines are effective when used properly. Read the instructions on how to use your medicines correctly and speak to your caregiver if you have questions. Follow up with your caregiver on a regular basis to make sure your asthma is well-controlled. If your asthma is not well-controlled, if you have been hospitalized for asthma, or if multiple medicines or medium to high doses of inhaled corticosteroids are needed to control your asthma, request a referral to an asthma specialist. HOME CARE INSTRUCTIONS   Take medicines as directed by your caregiver.  Control your home environment in the following ways to help prevent asthma attacks:  Change your heating and air conditioning filter at least once a month.  Place a filter or cheesecloth over your heating and air conditioning vents.  Limit the use of fireplaces and wood stoves.  Do not smoke. Do not stay in places where others are smoking.  Get rid of pests (such as roaches and mice) and their droppings.  If you see mold on a plant, throw it away.  Clean your floors and dust every week. Use unscented cleaning products. Use a vacuum cleaner with a HEPA filter if possible. If vacuuming or cleaning triggers your asthma, try to find someone else to do these chores.  Floors in your house should be wood, tile, or vinyl. Carpet can trap dander and dust.  Use  allergy-proof pillows, mattress covers, and box spring covers.  Wash bedsheets and blankets every week in hot water and dry in a dryer.  Use a blanket that is made of polyester or cotton with a tight nap.  Do not use a dust ruffle on your bed.  Clean bathrooms and kitchens with bleach and repaint with mold-resistant paint.  Wash hands frequently.  Talk to your caregiver about an action plan for managing asthma attacks. This includes the use of a peak flow meter which measures the severity of the attack and medicines that can help stop the attack. An action plan can help minimize or stop the attack without having to seek medical care.  Remain calm during an asthma attack.  Always have a plan prepared for seeking medical attention. This should include contacting your caregiver and in the case of a severe attack, calling your local emergency services (911 in [place] SEEK MEDICAL CARE IF:   You have wheezing, shortness of breath, or a cough even if taking medicine to prevent attacks.  You have thickening of sputum.  Your sputum changes from clear or white to yellow, green, gray, or bloody.  You have any problems that may be related to the medicines you are taking (such as a rash, itching, swelling, or trouble breathing).  You are using a reliever medicine more than 2 3 times per week.  Your peak flow is still at 50 79% of personal best after following your action plan for 1   hour. SEEK IMMEDIATE MEDICAL CARE IF:   You are short of breath even at rest.  You get short of breath when doing very little physical activity.  You have difficulty eating, drinking, or talking due to asthma symptoms.  You have chest pain or you feel that your heart is beating fast.  You have a bluish color to your lips or fingernails.  You are lightheaded, dizzy, or faint.  You have a fever or persistent symptoms for more than 2 3 days.  You have a fever and symptoms suddenly get worse.  You seem to be  getting worse and are unresponsive to treatment during an asthma attack.  Your peak flow is less than 50% of personal best. MAKE SURE YOU:   Understand these instructions.  Will watch your condition.  Will get help right away if you are not doing well or get worse. Document Released: 04/30/2005 Document Revised: 04/16/2012 Document Reviewed: 12/17/2007 [employer] Patient Information 2014 [employer].  

## 2012-12-26 NOTE — Assessment & Plan Note (Signed)
Creatinine 1.7 today, stable, will continue to monitor

## 2012-12-26 NOTE — Assessment & Plan Note (Signed)
Has added krill oil, given sample of Crestor. Recheck at next visit. Avoid trans fats.

## 2012-12-26 NOTE — Assessment & Plan Note (Signed)
Continue Vitamin D 50000IU weekly for now and recheck in 3months

## 2012-12-29 ENCOUNTER — Ambulatory Visit (INDEPENDENT_AMBULATORY_CARE_PROVIDER_SITE_OTHER): Payer: Medicare Other | Admitting: *Deleted

## 2012-12-29 ENCOUNTER — Encounter: Payer: Self-pay | Admitting: Family Medicine

## 2012-12-29 ENCOUNTER — Encounter: Payer: Self-pay | Admitting: Internal Medicine

## 2012-12-29 DIAGNOSIS — I509 Heart failure, unspecified: Secondary | ICD-10-CM

## 2012-12-29 DIAGNOSIS — I255 Ischemic cardiomyopathy: Secondary | ICD-10-CM

## 2012-12-29 DIAGNOSIS — Z95810 Presence of automatic (implantable) cardiac defibrillator: Secondary | ICD-10-CM

## 2012-12-29 DIAGNOSIS — I2589 Other forms of chronic ischemic heart disease: Secondary | ICD-10-CM | POA: Diagnosis not present

## 2012-12-29 DIAGNOSIS — I5042 Chronic combined systolic (congestive) and diastolic (congestive) heart failure: Secondary | ICD-10-CM

## 2012-12-29 DIAGNOSIS — M7021 Olecranon bursitis, right elbow: Secondary | ICD-10-CM | POA: Insufficient documentation

## 2012-12-29 LAB — REMOTE ICD DEVICE
AL AMPLITUDE: 2.9 mv
AL IMPEDENCE ICD: 340 Ohm
BRDY-0002LV: 60 {beats}/min
BRDY-0003LV: 120 {beats}/min
BRDY-0004LV: 120 {beats}/min
DEVICE MODEL ICD: 631685
HV IMPEDENCE: 51 Ohm
LV LEAD IMPEDENCE ICD: 300 Ohm
MODE SWITCH EPISODES: 6
RV LEAD IMPEDENCE ICD: 390 Ohm
TZAT-0001FASTVT: 1
TZAT-0004FASTVT: 8
TZAT-0004SLOWVT: 8
TZAT-0013SLOWVT: 4
TZAT-0018FASTVT: NEGATIVE
TZAT-0018SLOWVT: NEGATIVE
TZAT-0019FASTVT: 7.5 V
TZAT-0019SLOWVT: 7.5 V
TZAT-0020FASTVT: 1 ms
TZON-0003FASTVT: 310 ms
TZON-0003SLOWVT: 345 ms
TZON-0004FASTVT: 24
TZON-0004SLOWVT: 30
TZON-0005FASTVT: 6
TZON-0010FASTVT: 40 ms
TZON-0010SLOWVT: 40 ms
TZST-0001FASTVT: 3
TZST-0001FASTVT: 4
TZST-0001SLOWVT: 3
TZST-0001SLOWVT: 5
TZST-0003FASTVT: 25 J
TZST-0003FASTVT: 40 J
TZST-0003SLOWVT: 30 J

## 2012-12-29 NOTE — Assessment & Plan Note (Signed)
No redness or warmth, resolving, continue to ice and apply [place] report if worsens for referral to ortho.

## 2012-12-29 NOTE — Assessment & Plan Note (Signed)
Using CPAP routinely 

## 2012-12-29 NOTE — Assessment & Plan Note (Signed)
Well controlled, no changes 

## 2012-12-29 NOTE — Progress Notes (Signed)
[name], female   DOB: 1940/10/21, 72 y.o.   MRN: [id number] HARRYETTE SHUART [id number] Apr 09, 1941 12/29/2012      Progress Note-Follow Up  Subjective  Chief Complaint  Chief Complaint  Patient presents with  . Follow-up    HPI  Patient is a 72 year old female in today for. Follows with endocrinology Dr. Juliet Rude and has an appointment next month. Reports good hemoglobin and see at last visit. She reports it was 6.2. She denies polyuria or polydipsia. Has ongoing right knee pain but it is slowly improving she was seen point. She presents with swelling and pain in her right elbow but that is resolving. She denies any trauma. She denies chest pain or palpitations. No shortness of breath, fevers no GI or GU complaints.  Past Medical History  Diagnosis Date  . LBBB (left bundle branch block)   . Carotid bruit 10/19/2011    a. 10/19/2011 carotid duplex - Mild hard plaque bilaterally. Stable 40-59% bilateral ICA stenosis.   Marland Kitchen Dyslipidemia   . Hypertension   . Chronic diastolic CHF (congestive heart failure)     Diastolic heart failure but with history of variable ejection fractions most recent ejection fraction normal mild LVH June 2012 status post recent hospitalization in June for heart failure with volume overload  . Back pain, chronic     "just when I walk; mass on 3rd and 4th vertebrae right lower back"  . Cardiomyopathy, ischemic     05/2001 - s/p CABG x4, LIMA --> LAD, saphenous vein --> first diagonal artery, saphenous vein --> 2nd diagonal ,  saphenous vein --> RCA  . History of recurrent UTIs   . 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 CRI (chronic renal insufficiency)   . Biventricular implantable cardiac defibrillator in situ 2007, 2012    s/p device revision secondary to end of life  . Hypoxemia requiring supplemental oxygen   . Candidiasis of vulva and vagina   . Hypothyroidism   . 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 Truett Perna, in remission  . Pneumonia 12/27/11    "3 times; it's been a long time ago"  . OSA on CPAP   . Gout attack ~ 08/2011  . PAF (paroxysmal atrial fibrillation)     on Coumadin  . DM (diabetes mellitus), type 2 with complications     insulin dependent, retinopathy, neuropathy  . Mural thrombus of left ventricle     before 2003, while on Coumadin, No h/o CVA  . Morbid obesity with BMI of 45.0-49.9, adult     Ht. 5'4". BMI 47.2  . Vitamin D deficiency 06/09/2012    historical  . Olecranon bursitis of right elbow 12/29/2012    Past Surgical History  Procedure Laterality Date  . Cardiac catheterization  06/03/01  . Cholecystectomy  1970  . Tubal ligation  1972  . Abdominal hysterectomy  1982  . Insert / replace / remove pacemaker  2007; 2012    w/AICD  . Coronary artery bypass graft  2003    CABG X4  . Cataract extraction      left eye    Family History  Problem Relation Age of Onset  . Cancer Mother     kidney and female repo  . Heart disease Father   . Asthma Mother     History   Social History  . Marital Status: Married    Spouse Name:  N/A    Number of Children: Y  . Years of Education: N/A   Occupational History  . 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    Social History Main Topics  . Smoking status: Never Smoker   . Smokeless tobacco: Never Used  . Alcohol Use: No  . Drug Use: No  . Sexual Activity: Not Currently   Other Topics Concern  . Not on file   Social History Narrative   Regular Exercise:  No    Current Outpatient Prescriptions on File Prior to Visit  Medication Sig Dispense Refill  . allopurinol (ZYLOPRIM) 100 MG tablet Take 100 mg by mouth daily.      Marland Kitchen amLODipine (NORVASC) 5 MG tablet Take 1 tablet (5 mg total) by mouth daily.  30 tablet  3  . aspirin EC 81 MG tablet Take 81 mg by mouth daily.      . Calcium 600-200 MG-UNIT per tablet Take 1 tablet by mouth daily.        . carvedilol (COREG) 25 MG tablet Take 2  tablets by mouth twice a day  120 tablet  3  . citalopram (CELEXA) 20 MG tablet Take 1 tablet (20 mg total) by mouth daily.  30 tablet  6  . clotrimazole (GYNE-LOTRIMIN 3) 2 % vaginal cream Place 1 Applicatorful vaginally at bedtime.  21 g  1  . enalapril (VASOTEC) 10 MG tablet Take 1 tablet (10 mg total) by mouth daily.  30 tablet  5  . gabapentin (NEURONTIN) 300 MG capsule Take 1 capsule (300 mg total) by mouth 2 (two) times daily.  60 capsule  2  . Krill Oil CAPS MegaRed caps daily by Schiff      . levothyroxine (SYNTHROID, LEVOTHROID) 25 MCG tablet TAKE ONE TABLET BY MOUTH EVERY DAY  90 tablet  0  . LORazepam (ATIVAN) 0.5 MG tablet Take 1 tablet (0.5 mg total) by mouth at bedtime.  30 tablet  1  . Multiple Vitamins-Minerals (MULTIVITAMIN WITH MINERALS) tablet Take 1 tablet by mouth daily.        . rosuvastatin (CRESTOR) 10 MG tablet Take 1 tablet (10 mg total) by mouth daily.  30 tablet  6  . torsemide (DEMADEX) 20 MG tablet Take 1 tablet (20 mg total) by mouth 2 (two) times daily.  120 tablet  2  . warfarin (COUMADIN) 5 MG tablet Take 5-7.5 mg by mouth See admin instructions. Takes 1.5 tablets (7.5mg ) on Tues, Thursday and Sunday. All other days takes 5mg .       No current facility-administered medications on file prior to visit.    Allergies  Allergen Reactions  . Sulfonamide Derivatives Other (See Comments)    Unknown; "childhood allergy/mother"    Review of Systems  Review of Systems  Constitutional: Negative for fever and malaise/fatigue.  HENT: Negative for congestion.   Eyes: Negative for discharge.  Respiratory: Negative for shortness of breath.   Cardiovascular: Negative for chest pain, palpitations and leg swelling.  Gastrointestinal: Negative for nausea, abdominal pain and diarrhea.  Genitourinary: Negative for dysuria.  Musculoskeletal: Positive for joint pain. Negative for falls.       Right elbow swelling, mild irritation, no trauma  Skin: Negative for rash.   Neurological: Negative for loss of consciousness and headaches.  Endo/Heme/Allergies: Negative for polydipsia.  Psychiatric/Behavioral: Negative for depression and suicidal ideas. The patient is not nervous/anxious and does not have insomnia.     Objective  BP 120/60  Pulse 64  Temp(Src) 98.2 F (36.8 C) (Oral)  Ht 5\' 4"  (1.626 m)  Wt 273 lb (123.832 kg)  BMI 46.84 kg/m2  SpO2 94%  Physical Exam  Physical Exam  Constitutional: She is oriented to person, place, and time and well-developed, well-nourished, and in no distress. No distress.  HENT:  Head: Normocephalic and atraumatic.  Eyes: Conjunctivae are normal.  Neck: Neck supple. No thyromegaly present.  Cardiovascular: Normal rate, regular rhythm and normal heart sounds.   No murmur heard. Pulmonary/Chest: Effort normal and breath sounds normal. She has no wheezes.  Abdominal: She exhibits no distension and no mass.  Musculoskeletal: She exhibits no edema.  2 cm raised, soft swelling over right elbow, no warmth or erythema  Lymphadenopathy:    She has no cervical adenopathy.  Neurological: She is alert and oriented to person, place, and time.  Skin: Skin is warm and dry. No rash noted. She is not diaphoretic.  Psychiatric: Memory, affect and judgment normal.    Lab Results  Component Value Date   TSH 2.49 12/22/2012   Lab Results  Component Value Date   WBC 7.9 12/22/2012   HGB 12.7 12/22/2012   HCT 38.4 12/22/2012   MCV 91.5 12/22/2012   PLT 190.0 12/22/2012   Lab Results  Component Value Date   CREATININE 1.7* 12/22/2012   BUN 62* 12/22/2012   NA 138 12/22/2012   K 5.0 12/22/2012   CL 100 12/22/2012   CO2 31 12/22/2012   Lab Results  Component Value Date   ALT 14 12/22/2012   AST 17 12/22/2012   ALKPHOS 59 12/22/2012   BILITOT 0.5 12/22/2012   Lab Results  Component Value Date   CHOL 165 09/29/2012   Lab Results  Component Value Date   HDL 38.30* 09/29/2012   Lab Results  Component Value Date   LDLCALC   Value: UNABLE TO CALCULATE IF TRIGLYCERIDE OVER 400 mg/dL        Total Cholesterol/HDL:CHD Risk Coronary Heart Disease Risk Table                     Men   Women  1/2 Average Risk   3.4   3.3  Average Risk       5.0   4.4  2 X Average Risk   9.6   7.1  3 X Average Risk  23.4   11.0        Use the calculated Patient Ratio above and the CHD Risk Table to determine the patient's CHD Risk.        ATP III CLASSIFICATION (LDL):  <100     mg/dL   Optimal  161-096  mg/dL   Near or Above                    Optimal  130-159  mg/dL   Borderline  045-409  mg/dL   High  >811     mg/dL   Very High 01/12/4781   Lab Results  Component Value Date   TRIG 394.0* 09/29/2012   Lab Results  Component Value Date   CHOLHDL 4 09/29/2012     Assessment & Plan  DYSLIPIDEMIA Has added krill oil, given sample of Crestor. Recheck at next visit. Avoid trans fats.  CKD (chronic kidney disease) stage 3, GFR 30-59 ml/min Creatinine 1.7 today, stable, will continue to monitor  Vitamin D deficiency Continue Vitamin D 50000IU weekly for now and recheck in 3months  HYPERTENSION Well controlled, no changes.  OSA (obstructive sleep apnea) Using CPAP routinely.   Olecranon bursitis of right elbow No redness or warmth, resolving, continue to ice and apply Salon Pas report if worsens for referral to ortho.

## 2012-12-30 ENCOUNTER — Other Ambulatory Visit: Payer: Self-pay | Admitting: Cardiology

## 2012-12-31 ENCOUNTER — Other Ambulatory Visit: Payer: Self-pay | Admitting: *Deleted

## 2012-12-31 ENCOUNTER — Telehealth: Payer: Self-pay | Admitting: *Deleted

## 2012-12-31 ENCOUNTER — Ambulatory Visit (INDEPENDENT_AMBULATORY_CARE_PROVIDER_SITE_OTHER): Payer: Medicare Other | Admitting: Pharmacist

## 2012-12-31 DIAGNOSIS — I6350 Cerebral infarction due to unspecified occlusion or stenosis of unspecified cerebral artery: Secondary | ICD-10-CM | POA: Diagnosis not present

## 2012-12-31 DIAGNOSIS — I4891 Unspecified atrial fibrillation: Secondary | ICD-10-CM

## 2012-12-31 DIAGNOSIS — I238 Other current complications following acute myocardial infarction: Secondary | ICD-10-CM | POA: Diagnosis not present

## 2012-12-31 DIAGNOSIS — G609 Hereditary and idiopathic neuropathy, unspecified: Secondary | ICD-10-CM

## 2012-12-31 LAB — POCT INR: INR: 2.3

## 2012-12-31 MED ORDER — AMLODIPINE BESYLATE 5 MG PO TABS: 5.0000 mg | ORAL_TABLET | Freq: Every day | ORAL | Status: DC

## 2012-12-31 MED ORDER — GABAPENTIN 300 MG PO CAPS: 300.0000 mg | ORAL_CAPSULE | Freq: Two times a day (BID) | ORAL | Status: DC

## 2012-12-31 NOTE — Telephone Encounter (Signed)
Faxed refill request received from pharmacy for Gabapentin Last filled by MD on 05.22.14, #[id number] Last AEX - 08.15.14 Please Advise/SLS

## 2012-12-31 NOTE — Telephone Encounter (Signed)
Rx request to pharmacy/SLS  

## 2012-12-31 NOTE — Telephone Encounter (Signed)
Rx  sent  to  [facility]

## 2013-01-02 ENCOUNTER — Ambulatory Visit: Payer: Medicare Other | Admitting: Family Medicine

## 2013-01-02 ENCOUNTER — Encounter: Payer: Self-pay | Admitting: *Deleted

## 2013-01-14 ENCOUNTER — Other Ambulatory Visit: Payer: Self-pay | Admitting: *Deleted

## 2013-01-14 MED ORDER — LEVOTHYROXINE SODIUM 25 MCG PO TABS: ORAL_TABLET | ORAL | Status: DC

## 2013-01-14 NOTE — Telephone Encounter (Signed)
Rx request to pharmacy/SLS  

## 2013-01-15 ENCOUNTER — Telehealth: Payer: Self-pay

## 2013-01-15 ENCOUNTER — Ambulatory Visit (HOSPITAL_BASED_OUTPATIENT_CLINIC_OR_DEPARTMENT_OTHER): Payer: Medicare Other | Admitting: Nurse Practitioner

## 2013-01-15 VITALS — BP 132/57 | HR 61 | Temp 97.9°F | Resp 19 | Ht 64.0 in | Wt 272.8 lb

## 2013-01-15 DIAGNOSIS — C8595 Non-Hodgkin lymphoma, unspecified, lymph nodes of inguinal region and lower limb: Secondary | ICD-10-CM | POA: Diagnosis not present

## 2013-01-15 DIAGNOSIS — C8589 Other specified types of non-Hodgkin lymphoma, extranodal and solid organ sites: Secondary | ICD-10-CM

## 2013-01-15 NOTE — Telephone Encounter (Signed)
gv and printed appt sched and avs forpt for March 2015  °

## 2013-01-15 NOTE — Progress Notes (Signed)
OFFICE PROGRESS NOTE  Interval history:  Destiny [name] returns as scheduled. She feels well. No fevers or sweats. No palpable lymph nodes. She has a good appetite. She denies pain.   Objective: Blood pressure 132/57, pulse 61, temperature 97.9 F (36.6 C), temperature source Oral, resp. rate 19, height 5\' 4"  (1.626 m), weight 272 lb 12.8 oz (123.741 kg).  Oropharynx is without thrush or ulceration. No palpable cervical, supraclavicular, axillary or inguinal lymph nodes. No mass at the left thigh. Lungs are clear. Regular cardiac rhythm. Abdomen soft and nontender. Obese. Trace lower leg edema bilaterally.  Lab Results: Lab Results  Component Value Date   WBC 7.9 12/22/2012   HGB 12.7 12/22/2012   HCT 38.4 12/22/2012   MCV 91.5 12/22/2012   PLT 190.0 12/22/2012    Chemistry:    Chemistry      Component Value Date/Time   NA 138 12/22/2012 0941   K 5.0 12/22/2012 0941   CL 100 12/22/2012 0941   CO2 31 12/22/2012 0941   BUN 62* 12/22/2012 0941   CREATININE 1.7* 12/22/2012 0941   CREATININE 2.23* 12/26/2011 1236      Component Value Date/Time   CALCIUM 9.2 12/22/2012 0941   ALKPHOS 59 12/22/2012 0941   AST 17 12/22/2012 0941   ALT 14 12/22/2012 0941   BILITOT 0.5 12/22/2012 0941       Studies/Results: No results found.  Medications: I have reviewed the patient's current medications.  Assessment/Plan:  1. Non-Hodgkin's lymphoma, high-grade B-cell lymphoma, involving left inguinal mass, status post core biopsy 03/21/2009. Staging PET scan on 03/16/2009 showed a left inguinal nodal conglomerate measuring 7 x 6.3 cm with intense hypermetabolic activity with additional hypermetabolic tumor and mass-like swelling within the muscle groups extending to the left knee with a permeative appearance of the distal femoral metaphysis and femoral condyles. She completed 6 cycles of CHOP (Adriamycin was deleted and etoposide substituted) and rituximab with marked clinical and radiographic  improvement. 2. Hospitalization 11/25 through 04/10/2009 with acute exacerbation of diastolic congestive heart failure, likely secondary to Lasix being held and intravenous fluids given with cycle 1 of chemotherapy. 3. Ischemic cardiomyopathy status post implantation of a biventricular ICD 08/02/2005 with improvement in the ejection fraction. 4. Insulin-dependent diabetes. 5. Hypertension. 6. Hypercholesterolemia.  7. Hypertriglyceridemia. 8. Sleep apnea on CPAP and home oxygen. 9. History of peripheral neuropathy, unchanged following chemotherapy. 10. Chronic low back pain followed by Dr. Ethelene Hal. 11. Hospitalization September 2013 with acute CHF and cellulitis. 12. Anemia during the September 2013 hospital admission. Hemoglobin was in normal range on 09/29/2012 and 12/22/2012.  Disposition-Destiny [name] appears stable. She remains in clinical remission from non-Hodgkin's lymphoma. She will return for a followup visit in 6 months.  Plan reviewed with Dr. Truett Perna.  Lonna Cobb ANP/GNP-BC

## 2013-02-04 ENCOUNTER — Other Ambulatory Visit: Payer: Self-pay | Admitting: *Deleted

## 2013-02-04 DIAGNOSIS — I10 Essential (primary) hypertension: Secondary | ICD-10-CM

## 2013-02-04 MED ORDER — CARVEDILOL 25 MG PO TABS: ORAL_TABLET | ORAL | Status: DC

## 2013-02-04 MED ORDER — CITALOPRAM HYDROBROMIDE 20 MG PO TABS: 20.0000 mg | ORAL_TABLET | Freq: Every day | ORAL | Status: DC

## 2013-02-04 MED ORDER — ENALAPRIL MALEATE 10 MG PO TABS: 10.0000 mg | ORAL_TABLET | Freq: Every day | ORAL | Status: DC

## 2013-02-04 NOTE — Telephone Encounter (Signed)
Faxed refill request received from pharmacy for Celexa Last filled by MD on 02.06.14, #[id number] Last AEX - 08.15.14 Next AEX - 3 Months Refill sent per [facility] refill protocol/SLS

## 2013-02-06 DIAGNOSIS — Z23 Encounter for immunization: Secondary | ICD-10-CM | POA: Diagnosis not present

## 2013-02-10 ENCOUNTER — Other Ambulatory Visit: Payer: Self-pay | Admitting: Family Medicine

## 2013-02-10 NOTE — Telephone Encounter (Signed)
Faxed RX 

## 2013-02-10 NOTE — Telephone Encounter (Signed)
Please advise refill? Last RX was done on 12-05-12 quantity 30 with 1 refill  If ok fax to [phone]

## 2013-02-11 ENCOUNTER — Ambulatory Visit (INDEPENDENT_AMBULATORY_CARE_PROVIDER_SITE_OTHER): Payer: Medicare Other | Admitting: Pharmacist

## 2013-02-11 DIAGNOSIS — I238 Other current complications following acute myocardial infarction: Secondary | ICD-10-CM | POA: Diagnosis not present

## 2013-02-11 DIAGNOSIS — I4891 Unspecified atrial fibrillation: Secondary | ICD-10-CM

## 2013-02-11 DIAGNOSIS — I6350 Cerebral infarction due to unspecified occlusion or stenosis of unspecified cerebral artery: Secondary | ICD-10-CM | POA: Diagnosis not present

## 2013-02-11 LAB — POCT INR: INR: 3.1

## 2013-03-05 ENCOUNTER — Ambulatory Visit (INDEPENDENT_AMBULATORY_CARE_PROVIDER_SITE_OTHER): Payer: Medicare Other | Admitting: Family Medicine

## 2013-03-05 ENCOUNTER — Inpatient Hospital Stay (HOSPITAL_BASED_OUTPATIENT_CLINIC_OR_DEPARTMENT_OTHER)
Admission: EM | Admit: 2013-03-05 | Discharge: 2013-03-24 | DRG: 291 | Disposition: A | Discharge status: Rehabilitation Facility | Payer: Medicare Other | Attending: Internal Medicine | Admitting: Internal Medicine

## 2013-03-05 ENCOUNTER — Emergency Department (HOSPITAL_BASED_OUTPATIENT_CLINIC_OR_DEPARTMENT_OTHER): Payer: Medicare Other

## 2013-03-05 ENCOUNTER — Encounter: Payer: Self-pay | Admitting: Family Medicine

## 2013-03-05 ENCOUNTER — Encounter (HOSPITAL_BASED_OUTPATIENT_CLINIC_OR_DEPARTMENT_OTHER): Payer: Self-pay | Admitting: Emergency Medicine

## 2013-03-05 VITALS — BP 164/62 | HR 79 | Temp 99.1°F | Ht 64.0 in

## 2013-03-05 DIAGNOSIS — Z66 Do not resuscitate: Secondary | ICD-10-CM | POA: Diagnosis not present

## 2013-03-05 DIAGNOSIS — Z515 Encounter for palliative care: Secondary | ICD-10-CM

## 2013-03-05 DIAGNOSIS — E870 Hyperosmolality and hypernatremia: Secondary | ICD-10-CM | POA: Diagnosis not present

## 2013-03-05 DIAGNOSIS — I5042 Chronic combined systolic (congestive) and diastolic (congestive) heart failure: Secondary | ICD-10-CM

## 2013-03-05 DIAGNOSIS — J9600 Acute respiratory failure, unspecified whether with hypoxia or hypercapnia: Secondary | ICD-10-CM

## 2013-03-05 DIAGNOSIS — C8589 Other specified types of non-Hodgkin lymphoma, extranodal and solid organ sites: Secondary | ICD-10-CM | POA: Diagnosis present

## 2013-03-05 DIAGNOSIS — M549 Dorsalgia, unspecified: Secondary | ICD-10-CM

## 2013-03-05 DIAGNOSIS — R0989 Other specified symptoms and signs involving the circulatory and respiratory systems: Secondary | ICD-10-CM

## 2013-03-05 DIAGNOSIS — J9601 Acute respiratory failure with hypoxia: Secondary | ICD-10-CM | POA: Diagnosis present

## 2013-03-05 DIAGNOSIS — E875 Hyperkalemia: Secondary | ICD-10-CM | POA: Diagnosis not present

## 2013-03-05 DIAGNOSIS — T502X5A Adverse effect of carbonic-anhydrase inhibitors, benzothiadiazides and other diuretics, initial encounter: Secondary | ICD-10-CM | POA: Diagnosis present

## 2013-03-05 DIAGNOSIS — N179 Acute kidney failure, unspecified: Secondary | ICD-10-CM | POA: Diagnosis not present

## 2013-03-05 DIAGNOSIS — Z4682 Encounter for fitting and adjustment of non-vascular catheter: Secondary | ICD-10-CM | POA: Diagnosis not present

## 2013-03-05 DIAGNOSIS — E669 Obesity, unspecified: Secondary | ICD-10-CM | POA: Diagnosis not present

## 2013-03-05 DIAGNOSIS — J209 Acute bronchitis, unspecified: Secondary | ICD-10-CM | POA: Diagnosis not present

## 2013-03-05 DIAGNOSIS — I5043 Acute on chronic combined systolic (congestive) and diastolic (congestive) heart failure: Secondary | ICD-10-CM | POA: Diagnosis not present

## 2013-03-05 DIAGNOSIS — G909 Disorder of the autonomic nervous system, unspecified: Secondary | ICD-10-CM | POA: Diagnosis present

## 2013-03-05 DIAGNOSIS — I2510 Atherosclerotic heart disease of native coronary artery without angina pectoris: Secondary | ICD-10-CM | POA: Diagnosis present

## 2013-03-05 DIAGNOSIS — E1139 Type 2 diabetes mellitus with other diabetic ophthalmic complication: Secondary | ICD-10-CM | POA: Diagnosis present

## 2013-03-05 DIAGNOSIS — I238 Other current complications following acute myocardial infarction: Secondary | ICD-10-CM

## 2013-03-05 DIAGNOSIS — Z5189 Encounter for other specified aftercare: Secondary | ICD-10-CM | POA: Diagnosis not present

## 2013-03-05 DIAGNOSIS — D649 Anemia, unspecified: Secondary | ICD-10-CM | POA: Diagnosis not present

## 2013-03-05 DIAGNOSIS — I5033 Acute on chronic diastolic (congestive) heart failure: Secondary | ICD-10-CM | POA: Diagnosis not present

## 2013-03-05 DIAGNOSIS — E559 Vitamin D deficiency, unspecified: Secondary | ICD-10-CM

## 2013-03-05 DIAGNOSIS — I4891 Unspecified atrial fibrillation: Secondary | ICD-10-CM

## 2013-03-05 DIAGNOSIS — Z95810 Presence of automatic (implantable) cardiac defibrillator: Secondary | ICD-10-CM | POA: Diagnosis not present

## 2013-03-05 DIAGNOSIS — I480 Paroxysmal atrial fibrillation: Secondary | ICD-10-CM | POA: Diagnosis present

## 2013-03-05 DIAGNOSIS — G4733 Obstructive sleep apnea (adult) (pediatric): Secondary | ICD-10-CM | POA: Diagnosis present

## 2013-03-05 DIAGNOSIS — R5381 Other malaise: Secondary | ICD-10-CM | POA: Diagnosis present

## 2013-03-05 DIAGNOSIS — J189 Pneumonia, unspecified organism: Secondary | ICD-10-CM | POA: Diagnosis not present

## 2013-03-05 DIAGNOSIS — L02419 Cutaneous abscess of limb, unspecified: Secondary | ICD-10-CM | POA: Diagnosis present

## 2013-03-05 DIAGNOSIS — Z6841 Body Mass Index (BMI) 40.0 and over, adult: Secondary | ICD-10-CM | POA: Diagnosis not present

## 2013-03-05 DIAGNOSIS — E1129 Type 2 diabetes mellitus with other diabetic kidney complication: Secondary | ICD-10-CM | POA: Diagnosis present

## 2013-03-05 DIAGNOSIS — E46 Unspecified protein-calorie malnutrition: Secondary | ICD-10-CM | POA: Diagnosis present

## 2013-03-05 DIAGNOSIS — Z8249 Family history of ischemic heart disease and other diseases of the circulatory system: Secondary | ICD-10-CM

## 2013-03-05 DIAGNOSIS — E039 Hypothyroidism, unspecified: Secondary | ICD-10-CM | POA: Diagnosis present

## 2013-03-05 DIAGNOSIS — Z8051 Family history of malignant neoplasm of kidney: Secondary | ICD-10-CM

## 2013-03-05 DIAGNOSIS — J811 Chronic pulmonary edema: Secondary | ICD-10-CM

## 2013-03-05 DIAGNOSIS — I2589 Other forms of chronic ischemic heart disease: Secondary | ICD-10-CM | POA: Diagnosis present

## 2013-03-05 DIAGNOSIS — I10 Essential (primary) hypertension: Secondary | ICD-10-CM

## 2013-03-05 DIAGNOSIS — M545 Low back pain, unspecified: Secondary | ICD-10-CM | POA: Diagnosis present

## 2013-03-05 DIAGNOSIS — J45909 Unspecified asthma, uncomplicated: Secondary | ICD-10-CM | POA: Diagnosis present

## 2013-03-05 DIAGNOSIS — N058 Unspecified nephritic syndrome with other morphologic changes: Secondary | ICD-10-CM | POA: Diagnosis present

## 2013-03-05 DIAGNOSIS — E1149 Type 2 diabetes mellitus with other diabetic neurological complication: Secondary | ICD-10-CM | POA: Diagnosis present

## 2013-03-05 DIAGNOSIS — E11319 Type 2 diabetes mellitus with unspecified diabetic retinopathy without macular edema: Secondary | ICD-10-CM | POA: Diagnosis present

## 2013-03-05 DIAGNOSIS — R109 Unspecified abdominal pain: Secondary | ICD-10-CM | POA: Diagnosis not present

## 2013-03-05 DIAGNOSIS — Z9981 Dependence on supplemental oxygen: Secondary | ICD-10-CM

## 2013-03-05 DIAGNOSIS — F411 Generalized anxiety disorder: Secondary | ICD-10-CM | POA: Diagnosis not present

## 2013-03-05 DIAGNOSIS — R0902 Hypoxemia: Secondary | ICD-10-CM | POA: Diagnosis not present

## 2013-03-05 DIAGNOSIS — E1142 Type 2 diabetes mellitus with diabetic polyneuropathy: Secondary | ICD-10-CM | POA: Diagnosis present

## 2013-03-05 DIAGNOSIS — I369 Nonrheumatic tricuspid valve disorder, unspecified: Secondary | ICD-10-CM | POA: Diagnosis not present

## 2013-03-05 DIAGNOSIS — M7021 Olecranon bursitis, right elbow: Secondary | ICD-10-CM

## 2013-03-05 DIAGNOSIS — E876 Hypokalemia: Secondary | ICD-10-CM | POA: Diagnosis not present

## 2013-03-05 DIAGNOSIS — J9620 Acute and chronic respiratory failure, unspecified whether with hypoxia or hypercapnia: Secondary | ICD-10-CM | POA: Diagnosis not present

## 2013-03-05 DIAGNOSIS — J9819 Other pulmonary collapse: Secondary | ICD-10-CM | POA: Diagnosis not present

## 2013-03-05 DIAGNOSIS — Z7982 Long term (current) use of aspirin: Secondary | ICD-10-CM

## 2013-03-05 DIAGNOSIS — I5031 Acute diastolic (congestive) heart failure: Secondary | ICD-10-CM | POA: Diagnosis present

## 2013-03-05 DIAGNOSIS — I890 Lymphedema, not elsewhere classified: Secondary | ICD-10-CM

## 2013-03-05 DIAGNOSIS — Z794 Long term (current) use of insulin: Secondary | ICD-10-CM

## 2013-03-05 DIAGNOSIS — K649 Unspecified hemorrhoids: Secondary | ICD-10-CM | POA: Diagnosis not present

## 2013-03-05 DIAGNOSIS — R0602 Shortness of breath: Secondary | ICD-10-CM | POA: Diagnosis not present

## 2013-03-05 DIAGNOSIS — E785 Hyperlipidemia, unspecified: Secondary | ICD-10-CM | POA: Diagnosis present

## 2013-03-05 DIAGNOSIS — I509 Heart failure, unspecified: Secondary | ICD-10-CM | POA: Diagnosis present

## 2013-03-05 DIAGNOSIS — Z951 Presence of aortocoronary bypass graft: Secondary | ICD-10-CM

## 2013-03-05 DIAGNOSIS — R040 Epistaxis: Secondary | ICD-10-CM | POA: Diagnosis not present

## 2013-03-05 DIAGNOSIS — Z79899 Other long term (current) drug therapy: Secondary | ICD-10-CM

## 2013-03-05 DIAGNOSIS — G9340 Encephalopathy, unspecified: Secondary | ICD-10-CM | POA: Diagnosis not present

## 2013-03-05 DIAGNOSIS — J90 Pleural effusion, not elsewhere classified: Secondary | ICD-10-CM | POA: Diagnosis not present

## 2013-03-05 DIAGNOSIS — Z7901 Long term (current) use of anticoagulants: Secondary | ICD-10-CM

## 2013-03-05 DIAGNOSIS — M79609 Pain in unspecified limb: Secondary | ICD-10-CM

## 2013-03-05 DIAGNOSIS — I447 Left bundle-branch block, unspecified: Secondary | ICD-10-CM | POA: Diagnosis present

## 2013-03-05 DIAGNOSIS — G8929 Other chronic pain: Secondary | ICD-10-CM | POA: Diagnosis present

## 2013-03-05 DIAGNOSIS — N390 Urinary tract infection, site not specified: Secondary | ICD-10-CM

## 2013-03-05 DIAGNOSIS — N183 Chronic kidney disease, stage 3 unspecified: Secondary | ICD-10-CM | POA: Diagnosis not present

## 2013-03-05 DIAGNOSIS — I255 Ischemic cardiomyopathy: Secondary | ICD-10-CM

## 2013-03-05 DIAGNOSIS — I6350 Cerebral infarction due to unspecified occlusion or stenosis of unspecified cerebral artery: Secondary | ICD-10-CM

## 2013-03-05 DIAGNOSIS — I129 Hypertensive chronic kidney disease with stage 1 through stage 4 chronic kidney disease, or unspecified chronic kidney disease: Secondary | ICD-10-CM | POA: Diagnosis present

## 2013-03-05 HISTORY — DX: Gout, unspecified: M10.9

## 2013-03-05 HISTORY — DX: Atherosclerotic heart disease of native coronary artery without angina pectoris: I25.10

## 2013-03-05 HISTORY — DX: Chronic combined systolic (congestive) and diastolic (congestive) heart failure: I50.42

## 2013-03-05 LAB — BASIC METABOLIC PANEL
BUN: 46 mg/dL — ABNORMAL HIGH (ref 6–23)
CO2: 31 mEq/L (ref 19–32)
Calcium: 10.4 mg/dL (ref 8.4–10.5)
Glucose, Bld: 148 mg/dL — ABNORMAL HIGH (ref 70–99)
Potassium: 5.4 mEq/L — ABNORMAL HIGH (ref 3.5–5.1)
Sodium: 137 mEq/L (ref 135–145)

## 2013-03-05 LAB — CBC
Hemoglobin: 11.8 g/dL — ABNORMAL LOW (ref 12.0–15.0)
MCH: 30.2 pg (ref 26.0–34.0)
MCV: 95.4 fL (ref 78.0–100.0)
RBC: 3.91 MIL/uL (ref 3.87–5.11)

## 2013-03-05 LAB — MRSA PCR SCREENING: MRSA by PCR: NEGATIVE

## 2013-03-05 LAB — PROTIME-INR: Prothrombin Time: 19.5 seconds — ABNORMAL HIGH (ref 11.6–15.2)

## 2013-03-05 LAB — TROPONIN I: Troponin I: <0.3 ng/mL (ref <0.30)

## 2013-03-05 MED ORDER — NITROGLYCERIN 2 % TD OINT
1.0000 [in_us] | TOPICAL_OINTMENT | Freq: Four times a day (QID) | TRANSDERMAL | Status: DC
  Administered 2013-03-05: 1 [in_us] via TOPICAL
  Filled 2013-03-05: qty 1

## 2013-03-05 MED ORDER — ALBUTEROL SULFATE (5 MG/ML) 0.5% IN NEBU
5.0000 mg | INHALATION_SOLUTION | Freq: Once | RESPIRATORY_TRACT | Status: AC
  Administered 2013-03-05: 5 mg via RESPIRATORY_TRACT

## 2013-03-05 MED ORDER — INSULIN ASPART 100 UNIT/ML ~~LOC~~ SOLN: 0.0000 [IU] | Freq: Three times a day (TID) | SUBCUTANEOUS | Status: DC

## 2013-03-05 MED ORDER — ACETAMINOPHEN 325 MG PO TABS
650.0000 mg | ORAL_TABLET | ORAL | Status: DC | PRN
  Administered 2013-03-09: 650 mg via ORAL
  Filled 2013-03-05: qty 2

## 2013-03-05 MED ORDER — ONDANSETRON HCL 4 MG/2ML IJ SOLN
4.0000 mg | Freq: Four times a day (QID) | INTRAMUSCULAR | Status: DC | PRN
  Administered 2013-03-06 – 2013-03-23 (×2): 4 mg via INTRAVENOUS
  Filled 2013-03-05 (×2): qty 2

## 2013-03-05 MED ORDER — FUROSEMIDE 10 MG/ML IJ SOLN
80.0000 mg | Freq: Once | INTRAMUSCULAR | Status: AC
  Administered 2013-03-05: 80 mg via INTRAVENOUS
  Filled 2013-03-05: qty 8

## 2013-03-05 MED ORDER — NITROGLYCERIN IN D5W 200-5 MCG/ML-% IV SOLN
10.0000 ug/min | INTRAVENOUS | Status: DC
  Administered 2013-03-05: 10 ug/min via INTRAVENOUS
  Filled 2013-03-05: qty 250

## 2013-03-05 MED ORDER — SODIUM CHLORIDE 0.9 % IV SOLN: 250.0000 mL | INTRAVENOUS | Status: DC | PRN

## 2013-03-05 MED ORDER — FUROSEMIDE 10 MG/ML IJ SOLN
40.0000 mg | Freq: Two times a day (BID) | INTRAMUSCULAR | Status: DC
  Administered 2013-03-05: 40 mg via INTRAVENOUS
  Filled 2013-03-05 (×2): qty 4

## 2013-03-05 MED ORDER — ALBUTEROL SULFATE (5 MG/ML) 0.5% IN NEBU
INHALATION_SOLUTION | RESPIRATORY_TRACT | Status: AC
  Filled 2013-03-05: qty 1

## 2013-03-05 MED ORDER — SODIUM CHLORIDE 0.9 % IJ SOLN
3.0000 mL | Freq: Two times a day (BID) | INTRAMUSCULAR | Status: DC
  Administered 2013-03-05 – 2013-03-15 (×19): 3 mL via INTRAVENOUS

## 2013-03-05 MED ORDER — IPRATROPIUM BROMIDE 0.02 % IN SOLN
0.5000 mg | Freq: Once | RESPIRATORY_TRACT | Status: AC
  Administered 2013-03-05: 0.5 mg via RESPIRATORY_TRACT

## 2013-03-05 MED ORDER — NITROGLYCERIN 0.4 MG SL SUBL
0.4000 mg | SUBLINGUAL_TABLET | Freq: Once | SUBLINGUAL | Status: AC
  Administered 2013-03-05: 0.4 mg via SUBLINGUAL
  Filled 2013-03-05 (×2): qty 25

## 2013-03-05 MED ORDER — BIOTENE DRY MOUTH MT LIQD
15.0000 mL | Freq: Two times a day (BID) | OROMUCOSAL | Status: DC
  Administered 2013-03-05 – 2013-03-12 (×11): 15 mL via OROMUCOSAL

## 2013-03-05 MED ORDER — SODIUM CHLORIDE 0.9 % IJ SOLN
3.0000 mL | INTRAMUSCULAR | Status: DC | PRN
  Administered 2013-03-15: 3 mL via INTRAVENOUS

## 2013-03-05 MED ORDER — ASPIRIN EC 81 MG PO TBEC
81.0000 mg | DELAYED_RELEASE_TABLET | Freq: Every day | ORAL | Status: DC
  Administered 2013-03-05 – 2013-03-16 (×12): 81 mg via ORAL
  Filled 2013-03-05 (×12): qty 1

## 2013-03-05 MED ORDER — IPRATROPIUM BROMIDE 0.02 % IN SOLN
RESPIRATORY_TRACT | Status: AC
  Filled 2013-03-05: qty 2.5

## 2013-03-05 NOTE — Progress Notes (Signed)
ANTICOAGULATION CONSULT NOTE - Initial Consult  Pharmacy Consult for coumadin Indication: atrial fibrillation  Allergies  Allergen Reactions  . Sulfonamide Derivatives Other (See Comments)    Unknown; "childhood allergy/mother"    Patient Measurements: Height: 5' 4.5" (163.8 cm) Weight: 273 lb 9.5 oz (124.1 kg) IBW/kg (Calculated) : 55.85   Vital Signs: Temp: 98.9 F (37.2 C) (10/23 2100) Temp src: Oral (10/23 2100) BP: 163/68 mmHg (10/23 2100) Pulse Rate: 78 (10/23 2100)  Labs:  Recent Labs  03/05/13 1600 03/05/13 1608  HGB  --  11.8*  HCT  --  37.3  PLT  --  183  CREATININE  --  1.60*  TROPONINI <0.30  --     Estimated Creatinine Clearance: 43 ml/min (by C-G formula based on Cr of 1.6).   Medical History: Past Medical History  Diagnosis Date  . LBBB (left bundle branch block)   . Carotid bruit 10/19/2011    a. 10/19/2011 carotid duplex - Mild hard plaque bilaterally. Stable 40-59% bilateral ICA stenosis.   Marland Kitchen Dyslipidemia   . Hypertension   . Chronic diastolic CHF (congestive heart failure)     Diastolic heart failure but with history of variable ejection fractions most recent ejection fraction normal mild LVH June 2012 status post recent hospitalization in June for heart failure with volume overload  . Back pain, chronic     "just when I walk; mass on 3rd and 4th vertebrae right lower back"  . Cardiomyopathy, ischemic     05/2001 - s/p CABG x4, LIMA --> LAD, saphenous vein --> first diagonal artery, saphenous vein --> 2nd diagonal ,  saphenous vein --> RCA  . History of recurrent UTIs   . 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 CRI (chronic renal insufficiency)   . Biventricular implantable cardiac defibrillator in situ 2007, 2012    s/p device revision secondary to end of life  . Hypoxemia requiring supplemental oxygen   . Candidiasis of vulva and vagina   . Hypothyroidism   . 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 Truett Perna, in remission  . Pneumonia 12/27/11    "3 times; it's been a long time ago"  . OSA on CPAP   . Gout attack ~ 08/2011  . PAF (paroxysmal atrial fibrillation)     on Coumadin  . DM (diabetes mellitus), type 2 with complications     insulin dependent, retinopathy, neuropathy  . Mural thrombus of left ventricle     before 2003, while on Coumadin, No h/o CVA  . Morbid obesity with BMI of 45.0-49.9, adult     Ht. 5'4". BMI 47.2  . Vitamin D deficiency 06/09/2012    historical  . Olecranon bursitis of right elbow 12/29/2012    Medications:  Per pt and husband: coumadin 5 mg MWF and 7.5 mg TTSS. She took dose at home this morning.   Assessment: 72 yo F admitted for SOB.  Pharmacy has been consulted to continue her coumadin for afib.  Her admission INR has not been drawn yet.  Her H/H is 11.8/37.3 and pltc 183.   She took dose this morning of 5 mg.  Home dose is Evern Bio, Thurs and Saturdays she takes 1 1/2 tablets to equal 7.5 mg and on MWF she takes 1 5 mg tablet.  Goal of Therapy:  INR 2-3   Plan:  1. No dose tonight since she took dose of 5 mg at home today  2. Daily INR Herby Abraham, Pharm.D. [phone] 03/05/2013 9:53 PM

## 2013-03-05 NOTE — ED Notes (Signed)
Sent from Dr Lorelei Pont office with SOB.

## 2013-03-05 NOTE — ED Provider Notes (Signed)
CSN: [id number]     Arrival date & time 03/05/13  1545 History   First MD Initiated Contact with Patient 03/05/13 1548     Chief Complaint  Patient presents with  . Respiratory Distress    Patient is a 72 y.o. female presenting with shortness of breath. The history is provided by the patient.  Shortness of Breath Severity:  Severe Onset quality:  Gradual Duration:  4 days Timing:  Constant Progression:  Worsening Context: activity   Relieved by:  Nothing Worsened by:  Activity (lying flat) Ineffective treatments:  None tried Associated symptoms: cough (green mucus) and sore throat   Associated symptoms: no fever (her normal temperature runs 97)   She does have history of CHF and is chronically on oxygen.  Normally she just uses it intermittently but has had to use it constantly the last few days.  She does not have history of asthma and does not use inhalers per the patient.  Past Medical History  Diagnosis Date  . LBBB (left bundle branch block)   . Carotid bruit 10/19/2011    a. 10/19/2011 carotid duplex - Mild hard plaque bilaterally. Stable 40-59% bilateral ICA stenosis.   Marland Kitchen Dyslipidemia   . Hypertension   . Chronic diastolic CHF (congestive heart failure)     Diastolic heart failure but with history of variable ejection fractions most recent ejection fraction normal mild LVH June 2012 status post recent hospitalization in June for heart failure with volume overload  . Back pain, chronic     "just when I walk; mass on 3rd and 4th vertebrae right lower back"  . Cardiomyopathy, ischemic     05/2001 - s/p CABG x4, LIMA --> LAD, saphenous vein --> first diagonal artery, saphenous vein --> 2nd diagonal ,  saphenous vein --> RCA  . History of recurrent UTIs   . 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 CRI (chronic renal insufficiency)   . Biventricular implantable cardiac defibrillator in situ 2007, 2012    s/p device revision secondary to end of life  .  Hypoxemia requiring supplemental oxygen   . Candidiasis of vulva and vagina   . Hypothyroidism   . 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 Truett Perna, in remission  . Pneumonia 12/27/11    "3 times; it's been a long time ago"  . OSA on CPAP   . Gout attack ~ 08/2011  . PAF (paroxysmal atrial fibrillation)     on Coumadin  . DM (diabetes mellitus), type 2 with complications     insulin dependent, retinopathy, neuropathy  . Mural thrombus of left ventricle     before 2003, while on Coumadin, No h/o CVA  . Morbid obesity with BMI of 45.0-49.9, adult     Ht. 5'4". BMI 47.2  . Vitamin D deficiency 06/09/2012    historical  . Olecranon bursitis of right elbow 12/29/2012   Past Surgical History  Procedure Laterality Date  . Cardiac catheterization  06/03/01  . Cholecystectomy  1970  . Tubal ligation  1972  . Abdominal hysterectomy  1982  . Insert / replace / remove pacemaker  2007; 2012    w/AICD  . Coronary artery bypass graft  2003    CABG X4  . Cataract extraction      left eye   Family History  Problem Relation Age of Onset  . Cancer Mother     kidney and female repo  .  Heart disease Father   . Asthma Mother    History  Substance Use Topics  . Smoking status: Never Smoker   . Smokeless tobacco: Never Used  . Alcohol Use: No   OB History   Grav Para Term Preterm Abortions TAB SAB Ect Mult Living                 Review of Systems  Constitutional: Negative for fever (her normal temperature runs 97).  HENT: Positive for rhinorrhea, sinus pressure and sore throat.   Respiratory: Positive for cough (green mucus) and shortness of breath.   All other systems reviewed and are negative.    Allergies  Sulfonamide derivatives  Home Medications   Current Outpatient Rx  Name  Route  Sig  Dispense  Refill  . albuterol (PROVENTIL HFA;VENTOLIN HFA) 108 (90 BASE) MCG/ACT inhaler   Inhalation   Inhale 2 puffs into the lungs every 6 (six)  hours as needed for wheezing or shortness of breath.   1 Inhaler   1   . allopurinol (ZYLOPRIM) 100 MG tablet   Oral   Take 100 mg by mouth daily.         Marland Kitchen amLODipine (NORVASC) 5 MG tablet   Oral   Take 1 tablet (5 mg total) by mouth daily.   30 tablet   3   . aspirin EC 81 MG tablet   Oral   Take 81 mg by mouth daily.         . Calcium 600-200 MG-UNIT per tablet   Oral   Take 1 tablet by mouth daily.           . carvedilol (COREG) 25 MG tablet      Take 2 tablets by mouth twice a day   120 tablet   3   . citalopram (CELEXA) 20 MG tablet   Oral   Take 1 tablet (20 mg total) by mouth daily.   30 tablet   3   . clotrimazole (GYNE-LOTRIMIN 3) 2 % vaginal cream   Vaginal   Place 1 Applicatorful vaginally at bedtime.   21 g   1   . enalapril (VASOTEC) 10 MG tablet   Oral   Take 1 tablet (10 mg total) by mouth daily.   30 tablet   3   . gabapentin (NEURONTIN) 300 MG capsule   Oral   Take 1 capsule (300 mg total) by mouth 2 (two) times daily.   60 capsule   2   . insulin NPH (HUMULIN N,NOVOLIN N) 100 UNIT/ML injection   Subcutaneous   Inject 40-50 Units into the skin 2 (two) times daily. Inject 44 units every morning and inject 25 units every night.   1 vial      . insulin regular (NOVOLIN R,HUMULIN R) 100 units/mL injection   Subcutaneous   Inject 0.2-0.3 mL (20-30 Units total) into the skin 2 (two) times daily before a meal. Inject 30 units every morning and inject 15 units every night.   10 mL      . Krill Oil CAPS      MegaRed caps daily by Schiff         . levothyroxine (SYNTHROID, LEVOTHROID) 25 MCG tablet      TAKE ONE TABLET BY MOUTH EVERY DAY   90 tablet   0   . LORazepam (ATIVAN) 0.5 MG tablet      TAKE ONE TABLET BY MOUTH AT BEDTIME   30 tablet   1   .  Multiple Vitamins-Minerals (MULTIVITAMIN WITH MINERALS) tablet   Oral   Take 1 tablet by mouth daily.           . rosuvastatin (CRESTOR) 10 MG tablet   Oral   Take 1  tablet (10 mg total) by mouth daily.   30 tablet   6   . torsemide (DEMADEX) 20 MG tablet   Oral   Take 1 tablet (20 mg total) by mouth 2 (two) times daily.   120 tablet   2   . traMADol (ULTRAM) 50 MG tablet   Oral   Take 1 tablet (50 mg total) by mouth 2 (two) times daily as needed for pain.   60 tablet   1   . Vitamin D, Ergocalciferol, (DRISDOL) 50000 UNITS CAPS capsule   Oral   Take 1 capsule (50,000 Units total) by mouth every 7 (seven) days.   4 capsule   3   . warfarin (COUMADIN) 5 MG tablet      TAKE AS DIRECTED BY  ANTICOAGULATION  CLINIC   120 tablet   0     This is 3 months supply    BP 176/65  Pulse 76  Temp(Src) 99.8 F (37.7 C) (Oral)  Resp 22  Ht 5' 4.5" (1.638 m)  Wt 272 lb (123.378 kg)  BMI 45.98 kg/m2  SpO2 91% Physical Exam  Nursing note and vitals reviewed. Constitutional: No distress.  Obese   HENT:  Head: Normocephalic and atraumatic.  Right Ear: External ear normal.  Left Ear: External ear normal.  Mouth/Throat: No oropharyngeal exudate.  Eyes: Conjunctivae are normal. Right eye exhibits no discharge. Left eye exhibits no discharge. No scleral icterus.  Neck: Neck supple. No tracheal deviation present.  Cardiovascular: Normal rate, regular rhythm and intact distal pulses.   Pulmonary/Chest: Accessory muscle usage present. No stridor. Tachypnea noted. No respiratory distress. She has decreased breath sounds. She has wheezes. She has no rhonchi. She has no rales.  Abdominal: Soft. Bowel sounds are normal. She exhibits no distension. There is no tenderness. There is no rebound and no guarding.  Musculoskeletal: She exhibits no edema and no tenderness.  Trace edema bilateral lower extremities  Neurological: She is alert. She has normal strength. No sensory deficit. Cranial nerve deficit:  no gross defecits noted. She exhibits normal muscle tone. She displays no seizure activity. Coordination normal.  Skin: Skin is warm and dry. No rash  noted.  Psychiatric: She has a normal mood and affect.    ED Course  Procedures (including critical care time)  CRITICAL CARE Performed by: Linwood Dibbles R Total critical care time: 35 Critical care time was exclusive of separately billable procedures and treating other patients. Critical care was necessary to treat or prevent imminent or life-threatening deterioration. Critical care was time spent personally by me on the following activities: development of treatment plan with patient and/or surrogate as well as nursing, discussions with consultants, evaluation of patient's response to treatment, examination of patient, obtaining history from patient or surrogate, ordering and performing treatments and interventions, ordering and review of laboratory studies, ordering and review of radiographic studies, pulse oximetry and re-evaluation of patient's condition.  Labs Review Labs Reviewed  BASIC METABOLIC PANEL - Abnormal; Notable for the following:    Potassium 5.4 (*)    Glucose, Bld 148 (*)    BUN 46 (*)    Creatinine, Ser 1.60 (*)    GFR calc non Af Amer 32 (*)    GFR calc Af Amer 37 (*)  All other components within normal limits  CBC - Abnormal; Notable for the following:    WBC 15.2 (*)    Hemoglobin 11.8 (*)    All other components within normal limits  PRO B NATRIURETIC PEPTIDE - Abnormal; Notable for the following:    Pro B Natriuretic peptide (BNP) 3671.0 (*)    All other components within normal limits  TROPONIN I   Imaging Review Dg Chest Port 1 View  03/05/2013   CLINICAL DATA:  Fever. Shortness of breath.  EXAM: PORTABLE CHEST - 1 VIEW  COMPARISON:  January 28, 2012  FINDINGS: The heart size and mediastinal contours are stable. The heart size is enlarged. Cardiac pacemaker is unchanged. Patient is status post prior median sternotomy. There is central pulmonary edema. There is probable patchy consolidation of bilateral lung bases but evaluation is limited by  underpenetration of the lung bases. The visualized skeletal structures are stable.  IMPRESSION: Congestive heart failure. There is probable patchy consolidation of bilateral lung bases but evaluation is limited by underpenetration of the lung bases.   Electronically Signed   By: Sherian Rein M.D.   On: 03/05/2013 16:20    EKG Interpretation     Ventricular Rate:  75 PR Interval:  156 QRS Duration: 130 QT Interval:  436 QTC Calculation: 486 R Axis:   -116 Text Interpretation:  Atrial-sensed ventricular-paced rhythm Biventricular pacemaker detected Abnormal ECG No significant change since last tracing           Medications  nitroGLYCERIN (NITROGLYN) 2 % ointment 1 inch (1 inch Topical Given 03/05/13 1734)  furosemide (LASIX) injection 80 mg (not administered)  nitroGLYCERIN 0.2 mg/mL in dextrose 5 % infusion (not administered)  albuterol (PROVENTIL) (5 MG/ML) 0.5% nebulizer solution 5 mg (5 mg Nebulization Given 03/05/13 1557)  ipratropium (ATROVENT) nebulizer solution 0.5 mg (0.5 mg Nebulization Given 03/05/13 1557)  nitroGLYCERIN (NITROSTAT) SL tablet 0.4 mg (0.4 mg Sublingual Given 03/05/13 1625)     MDM   1. CHF (congestive heart failure)    Patient's symptoms are most likely related to congestive heart failure. I suspect her wheezing is most likely related to a cardiac etiology. I doubt pneumonia or COPD exacerbation.  Patient was started on nitrates and Lasix. She'll be transferred to [facility] to the step down unit for further treatment and evaluation   Celene Kras, MD 03/05/13 1739

## 2013-03-05 NOTE — H&P (Signed)
PCP:   Danise Edge, MD   Chief Complaint:  sob  HPI: 72 yo female with h/o chf, s/p cabg, ckd, aicd, on home oxygen 2 L [place] cont (this has increased over the last month, was prev only prn during day) comes in with several days of worsening sob and cough.  No cp.  No n/v.  No le edema or swelling.  No cough.  No h/o copd.  Arrived at urgent care and was hypoxic on RA 75%.  Started on oxygen and given lasix 80mg  iv and ntg gtt and transferred to [facility].  Now she is feeling better, her breathing is improved.  She is also cpap dep for osa.    Review of Systems:  Positive and negative as per HPI otherwise all other systems are negative  Past Medical History: Past Medical History  Diagnosis Date  . LBBB (left bundle branch block)   . Carotid bruit 10/19/2011    a. 10/19/2011 carotid duplex - Mild hard plaque bilaterally. Stable 40-59% bilateral ICA stenosis.   Marland Kitchen Dyslipidemia   . Hypertension   . Chronic diastolic CHF (congestive heart failure)     Diastolic heart failure but with history of variable ejection fractions most recent ejection fraction normal mild LVH June 2012 status post recent hospitalization in June for heart failure with volume overload  . Back pain, chronic     "just when I walk; mass on 3rd and 4th vertebrae right lower back"  . Cardiomyopathy, ischemic     05/2001 - s/p CABG x4, LIMA --> LAD, saphenous vein --> first diagonal artery, saphenous vein --> 2nd diagonal ,  saphenous vein --> RCA  . History of recurrent UTIs   . 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 CRI (chronic renal insufficiency)   . Biventricular implantable cardiac defibrillator in situ 2007, 2012    s/p device revision secondary to end of life  . Hypoxemia requiring supplemental oxygen   . Candidiasis of vulva and vagina   . Hypothyroidism   . 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 Truett Perna, in remission  . Pneumonia 12/27/11   "3 times; it's been a long time ago"  . OSA on CPAP   . Gout attack ~ 08/2011  . PAF (paroxysmal atrial fibrillation)     on Coumadin  . DM (diabetes mellitus), type 2 with complications     insulin dependent, retinopathy, neuropathy  . Mural thrombus of left ventricle     before 2003, while on Coumadin, No h/o CVA  . Morbid obesity with BMI of 45.0-49.9, adult     Ht. 5'4". BMI 47.2  . Vitamin D deficiency 06/09/2012    historical  . Olecranon bursitis of right elbow 12/29/2012   Past Surgical History  Procedure Laterality Date  . Cardiac catheterization  06/03/01  . Cholecystectomy  1970  . Tubal ligation  1972  . Abdominal hysterectomy  1982  . Insert / replace / remove pacemaker  2007; 2012    w/AICD  . Coronary artery bypass graft  2003    CABG X4  . Cataract extraction      left eye    Medications: Prior to Admission medications   Medication Sig Start Date End Date Taking? Authorizing Provider  albuterol (PROVENTIL HFA;VENTOLIN HFA) 108 (90 BASE) MCG/ACT inhaler Inhale 2 puffs into the lungs every 6 (six) hours as needed for wheezing or shortness of breath. 12/26/12  Bradd Canary, MD  allopurinol (ZYLOPRIM) 100 MG tablet Take 100 mg by mouth daily. 12/13/11 03/05/13  Sandford Craze, NP  amLODipine (NORVASC) 5 MG tablet Take 1 tablet (5 mg total) by mouth daily. 12/31/12   Bradd Canary, MD  aspirin EC 81 MG tablet Take 81 mg by mouth daily.    Historical Provider, MD  Calcium 600-200 MG-UNIT per tablet Take 1 tablet by mouth daily.      Historical Provider, MD  carvedilol (COREG) 25 MG tablet Take 2 tablets by mouth twice a day 02/04/13   Bradd Canary, MD  citalopram (CELEXA) 20 MG tablet Take 1 tablet (20 mg total) by mouth daily. 02/04/13   Bradd Canary, MD  clotrimazole (GYNE-LOTRIMIN 3) 2 % vaginal cream Place 1 Applicatorful vaginally at bedtime. 10/02/12   Bradd Canary, MD  enalapril (VASOTEC) 10 MG tablet Take 1 tablet (10 mg total) by mouth daily. 02/04/13    Bradd Canary, MD  gabapentin (NEURONTIN) 300 MG capsule Take 1 capsule (300 mg total) by mouth 2 (two) times daily. 12/31/12   Sandford Craze, NP  insulin NPH (HUMULIN N,NOVOLIN N) 100 UNIT/ML injection Inject 40-50 Units into the skin 2 (two) times daily. Inject 44 units every morning and inject 25 units every night. 12/26/12   Bradd Canary, MD  insulin regular (NOVOLIN R,HUMULIN R) 100 units/mL injection Inject 0.2-0.3 mL (20-30 Units total) into the skin 2 (two) times daily before a meal. Inject 30 units every morning and inject 15 units every night. 12/26/12   Bradd Canary, MD  Krill Oil CAPS MegaRed caps daily by Schiff 06/09/12   Bradd Canary, MD  levothyroxine (SYNTHROID, LEVOTHROID) 25 MCG tablet TAKE ONE TABLET BY MOUTH EVERY DAY 01/14/13   Bradd Canary, MD  LORazepam (ATIVAN) 0.5 MG tablet TAKE ONE TABLET BY MOUTH AT BEDTIME 02/10/13   Bradd Canary, MD  Multiple Vitamins-Minerals (MULTIVITAMIN WITH MINERALS) tablet Take 1 tablet by mouth daily.      Historical Provider, MD  rosuvastatin (CRESTOR) 10 MG tablet Take 1 tablet (10 mg total) by mouth daily. 04/03/11   Edwyna Perfect, MD  torsemide (DEMADEX) 20 MG tablet Take 1 tablet (20 mg total) by mouth 2 (two) times daily. 10/24/12   Bradd Canary, MD  traMADol (ULTRAM) 50 MG tablet Take 1 tablet (50 mg total) by mouth 2 (two) times daily as needed for pain. 12/26/12   Bradd Canary, MD  Vitamin D, Ergocalciferol, (DRISDOL) 50000 UNITS CAPS capsule Take 1 capsule (50,000 Units total) by mouth every 7 (seven) days. 12/26/12   Bradd Canary, MD  warfarin (COUMADIN) 5 MG tablet TAKE AS DIRECTED BY  ANTICOAGULATION  CLINIC 12/30/12   Marinus Maw, MD    Allergies:   Allergies  Allergen Reactions  . Sulfonamide Derivatives Other (See Comments)    Unknown; "childhood allergy/mother"    Social History:  reports that she has never smoked. She has never used smokeless tobacco. She reports that she does not drink alcohol or use  illicit drugs.  Family History: Family History  Problem Relation Age of Onset  . Cancer Mother     kidney and female repo  . Heart disease Father   . Asthma Mother     Physical Exam: Filed Vitals:   03/05/13 1810 03/05/13 1812 03/05/13 1813 03/05/13 1833  BP: 75/57 89/67 176/75 163/49  Pulse: 62 70 100 81  Temp:  TempSrc:      Resp:      Height:      Weight:      SpO2:       General appearance: alert, cooperative and no distress Head: Normocephalic, without obvious abnormality, atraumatic Eyes: negative Nose: Nares normal. Septum midline. Mucosa normal. No drainage or sinus tenderness. Neck: no JVD and supple, symmetrical, trachea midline Lungs: diminished breath sounds bibasilar Heart: regular rate and rhythm, S1, S2 normal, no murmur, click, rub or gallop Abdomen: soft, non-tender; bowel sounds normal; no masses,  no organomegaly Extremities: extremities normal, atraumatic, no cyanosis or edema Pulses: 2+ and symmetric Skin: Skin color, texture, turgor normal. No rashes or lesions Neurologic: Grossly normal  Labs on Admission:   Recent Labs  03/05/13 1608  NA 137  K 5.4*  CL 98  CO2 31  GLUCOSE 148*  BUN 46*  CREATININE 1.60*  CALCIUM 10.4    Recent Labs  03/05/13 1608  WBC 15.2*  HGB 11.8*  HCT 37.3  MCV 95.4  PLT 183    Recent Labs  03/05/13 1600  TROPONINI <0.30   Radiological Exams on Admission: Dg Chest Port 1 View  03/05/2013   CLINICAL DATA:  Fever. Shortness of breath.  EXAM: PORTABLE CHEST - 1 VIEW  COMPARISON:  January 28, 2012  FINDINGS: The heart size and mediastinal contours are stable. The heart size is enlarged. Cardiac pacemaker is unchanged. Patient is status post prior median sternotomy. There is central pulmonary edema. There is probable patchy consolidation of bilateral lung bases but evaluation is limited by underpenetration of the lung bases. The visualized skeletal structures are stable.  IMPRESSION: Congestive heart  failure. There is probable patchy consolidation of bilateral lung bases but evaluation is limited by underpenetration of the lung bases.   Electronically Signed   By: Sherian Rein M.D.   On: 03/05/2013 16:20    Assessment/Plan 72 yo female with acute on chronic chf exacerbation probable combined  Principal Problem:   Acute respiratory failure with hypoxia - chf pathway.  Cont lasix 40mg  bid, reports on lasix 10mg  po bid at home chronically.  Cont cpap qhs.  Serial enzymes and ck 2 d echo in am.  Last echo with good systolic function pt reports she had aicd placed for "poor muscle in her heart".  Cont ntg gtt until rules out.  Anticoagulated.  Monitor cr closely with more aggressive diuresis, cr at baseline of 1.6.    Active Problems:   HYPERTENSION   LBBB   ATRIAL FIBRILLATION, PAROXYSMAL   ICD - IN SITU   Chronic combined systolic and diastolic CHF (congestive heart failure)   Cardiomyopathy, ischemic   CKD (chronic kidney disease) stage 3, GFR 30-59 ml/min   Acute on chronic diastolic CHF (congestive heart failure)   Hx of CABG  Medication reconciliation pending.    Calieb Lichtman A 03/05/2013, 8:42 PM

## 2013-03-06 ENCOUNTER — Encounter (HOSPITAL_COMMUNITY): Payer: Self-pay | Admitting: Nurse Practitioner

## 2013-03-06 DIAGNOSIS — E039 Hypothyroidism, unspecified: Secondary | ICD-10-CM | POA: Diagnosis present

## 2013-03-06 DIAGNOSIS — I369 Nonrheumatic tricuspid valve disorder, unspecified: Secondary | ICD-10-CM

## 2013-03-06 DIAGNOSIS — Z95810 Presence of automatic (implantable) cardiac defibrillator: Secondary | ICD-10-CM

## 2013-03-06 DIAGNOSIS — I5043 Acute on chronic combined systolic (congestive) and diastolic (congestive) heart failure: Principal | ICD-10-CM

## 2013-03-06 DIAGNOSIS — Z951 Presence of aortocoronary bypass graft: Secondary | ICD-10-CM

## 2013-03-06 DIAGNOSIS — J9600 Acute respiratory failure, unspecified whether with hypoxia or hypercapnia: Secondary | ICD-10-CM

## 2013-03-06 DIAGNOSIS — I4891 Unspecified atrial fibrillation: Secondary | ICD-10-CM

## 2013-03-06 DIAGNOSIS — J189 Pneumonia, unspecified organism: Secondary | ICD-10-CM

## 2013-03-06 LAB — GLUCOSE, CAPILLARY
Glucose-Capillary: 283 mg/dL — ABNORMAL HIGH (ref 70–99)
Glucose-Capillary: 239 mg/dL — ABNORMAL HIGH (ref 70–99)
Glucose-Capillary: 157 mg/dL — ABNORMAL HIGH (ref 70–99)

## 2013-03-06 LAB — BASIC METABOLIC PANEL
BUN: 42 mg/dL — ABNORMAL HIGH (ref 6–23)
CO2: 30 mEq/L (ref 19–32)
Calcium: 9.3 mg/dL (ref 8.4–10.5)
Chloride: 95 mEq/L — ABNORMAL LOW (ref 96–112)
Creatinine, Ser: 1.57 mg/dL — ABNORMAL HIGH (ref 0.50–1.10)
GFR calc Af Amer: 37 mL/min — ABNORMAL LOW (ref >90)
Glucose, Bld: 194 mg/dL — ABNORMAL HIGH (ref 70–99)

## 2013-03-06 LAB — TROPONIN I
Troponin I: <0.3 ng/mL (ref <0.30)
Troponin I: <0.3 ng/mL (ref <0.30)

## 2013-03-06 LAB — PROTIME-INR: INR: 1.68 — ABNORMAL HIGH (ref 0.00–1.49)

## 2013-03-06 LAB — TSH: TSH: 1.63 u[IU]/mL (ref 0.350–4.500)

## 2013-03-06 MED ORDER — CARVEDILOL 12.5 MG PO TABS
12.5000 mg | ORAL_TABLET | Freq: Two times a day (BID) | ORAL | Status: DC
  Administered 2013-03-06 – 2013-03-12 (×13): 12.5 mg via ORAL
  Filled 2013-03-06 (×17): qty 1

## 2013-03-06 MED ORDER — CITALOPRAM HYDROBROMIDE 20 MG PO TABS
20.0000 mg | ORAL_TABLET | Freq: Every day | ORAL | Status: DC
  Administered 2013-03-06 – 2013-03-12 (×7): 20 mg via ORAL
  Filled 2013-03-06 (×8): qty 1

## 2013-03-06 MED ORDER — ALLOPURINOL 100 MG PO TABS
100.0000 mg | ORAL_TABLET | Freq: Every day | ORAL | Status: DC
  Administered 2013-03-06 – 2013-03-12 (×7): 100 mg via ORAL
  Filled 2013-03-06 (×8): qty 1

## 2013-03-06 MED ORDER — INSULIN ASPART 100 UNIT/ML ~~LOC~~ SOLN: 0.0000 [IU] | Freq: Three times a day (TID) | SUBCUTANEOUS | Status: DC

## 2013-03-06 MED ORDER — INSULIN ASPART 100 UNIT/ML ~~LOC~~ SOLN
15.0000 [IU] | Freq: Every day | SUBCUTANEOUS | Status: DC
  Administered 2013-03-06 – 2013-03-12 (×6): 15 [IU] via SUBCUTANEOUS

## 2013-03-06 MED ORDER — LEVOTHYROXINE SODIUM 25 MCG PO TABS
25.0000 ug | ORAL_TABLET | Freq: Every day | ORAL | Status: DC
  Administered 2013-03-06 – 2013-03-12 (×7): 25 ug via ORAL
  Filled 2013-03-06 (×9): qty 1

## 2013-03-06 MED ORDER — TRAMADOL HCL 50 MG PO TABS: 50.0000 mg | ORAL_TABLET | Freq: Two times a day (BID) | ORAL | Status: DC | PRN

## 2013-03-06 MED ORDER — INSULIN NPH (HUMAN) (ISOPHANE) 100 UNIT/ML ~~LOC~~ SUSP
25.0000 [IU] | Freq: Every day | SUBCUTANEOUS | Status: DC
  Administered 2013-03-06 – 2013-03-08 (×3): 25 [IU] via SUBCUTANEOUS

## 2013-03-06 MED ORDER — INSULIN ASPART 100 UNIT/ML ~~LOC~~ SOLN
30.0000 [IU] | Freq: Every day | SUBCUTANEOUS | Status: DC
  Administered 2013-03-07 – 2013-03-12 (×5): 30 [IU] via SUBCUTANEOUS

## 2013-03-06 MED ORDER — TORSEMIDE 20 MG PO TABS
20.0000 mg | ORAL_TABLET | Freq: Two times a day (BID) | ORAL | Status: DC
  Administered 2013-03-06: 20 mg via ORAL
  Filled 2013-03-06 (×2): qty 1

## 2013-03-06 MED ORDER — LEVOFLOXACIN IN D5W 500 MG/100ML IV SOLN
500.0000 mg | INTRAVENOUS | Status: DC
  Administered 2013-03-06: 500 mg via INTRAVENOUS
  Filled 2013-03-06: qty 100

## 2013-03-06 MED ORDER — ENALAPRIL MALEATE 10 MG PO TABS
10.0000 mg | ORAL_TABLET | Freq: Every day | ORAL | Status: DC
  Administered 2013-03-06 – 2013-03-09 (×4): 10 mg via ORAL
  Filled 2013-03-06 (×4): qty 1

## 2013-03-06 MED ORDER — FUROSEMIDE 10 MG/ML IJ SOLN
80.0000 mg | Freq: Two times a day (BID) | INTRAMUSCULAR | Status: DC
  Administered 2013-03-06 – 2013-03-07 (×2): 80 mg via INTRAVENOUS
  Filled 2013-03-06 (×3): qty 8

## 2013-03-06 MED ORDER — INSULIN ASPART 100 UNIT/ML ~~LOC~~ SOLN: 0.0000 [IU] | Freq: Every day | SUBCUTANEOUS | Status: DC

## 2013-03-06 MED ORDER — LORAZEPAM 0.5 MG PO TABS
0.5000 mg | ORAL_TABLET | Freq: Every day | ORAL | Status: DC
  Administered 2013-03-06 – 2013-03-12 (×7): 0.5 mg via ORAL
  Filled 2013-03-06 (×7): qty 1

## 2013-03-06 MED ORDER — CARVEDILOL 25 MG PO TABS: 50.0000 mg | ORAL_TABLET | Freq: Two times a day (BID) | ORAL | Status: DC

## 2013-03-06 MED ORDER — GABAPENTIN 300 MG PO CAPS
300.0000 mg | ORAL_CAPSULE | Freq: Two times a day (BID) | ORAL | Status: DC
  Administered 2013-03-06 – 2013-03-12 (×14): 300 mg via ORAL
  Filled 2013-03-06 (×17): qty 1

## 2013-03-06 MED ORDER — WARFARIN - PHARMACIST DOSING INPATIENT
Freq: Every day | Status: DC
  Administered 2013-03-06 – 2013-03-10 (×3)

## 2013-03-06 MED ORDER — INSULIN NPH (HUMAN) (ISOPHANE) 100 UNIT/ML ~~LOC~~ SUSP
40.0000 [IU] | Freq: Every day | SUBCUTANEOUS | Status: DC
  Administered 2013-03-07 – 2013-03-09 (×3): 40 [IU] via SUBCUTANEOUS
  Filled 2013-03-06: qty 10

## 2013-03-06 MED ORDER — WARFARIN SODIUM 7.5 MG PO TABS
7.5000 mg | ORAL_TABLET | Freq: Once | ORAL | Status: AC
  Administered 2013-03-06: 7.5 mg via ORAL
  Filled 2013-03-06: qty 1

## 2013-03-06 NOTE — Progress Notes (Signed)
ANTICOAGULATION CONSULT NOTE - Follow Up  Pharmacy Consult for coumadin Indication: atrial fibrillation  Allergies  Allergen Reactions  . Sulfonamide Derivatives Other (See Comments)    Unknown; "childhood allergy/mother"   Patient Measurements: Height: 5' 4.5" (163.8 cm) Weight: 272 lb 4.3 oz (123.5 kg) IBW/kg (Calculated) : 55.85  Vital Signs: Temp: 99.4 F (37.4 C) (10/24 0755) Temp src: Oral (10/24 0755) BP: 113/96 mmHg (10/24 0755) Pulse Rate: 78 (10/24 0755)  Labs:  Recent Labs  03/05/13 1608 03/05/13 2200 03/06/13 0500 03/06/13 0630 03/06/13 0740 03/06/13 0920  HGB 11.8*  --   --   --   --   --   HCT 37.3  --   --   --   --   --   PLT 183  --   --   --   --   --   LABPROT  --  19.5* 19.3*  --   --   --   INR  --  1.70* 1.68*  --   --   --   CREATININE 1.60*  --   --   --  1.57*  --   TROPONINI  --  <0.30  --  <0.30  --  <0.30   Estimated Creatinine Clearance: 43.6 ml/min (by C-G formula based on Cr of 1.57).  Medical History: Past Medical History  Diagnosis Date  . LBBB (left bundle branch block)   . Carotid bruit 10/19/2011    a. 10/19/2011 carotid duplex - Mild hard plaque bilaterally. Stable 40-59% bilateral ICA stenosis.   Marland Kitchen Dyslipidemia   . Hypertension   . Chronic diastolic CHF (congestive heart failure)     Diastolic heart failure but with history of variable ejection fractions most recent ejection fraction normal mild LVH June 2012 status post recent hospitalization in June for heart failure with volume overload  . Back pain, chronic     "just when I walk; mass on 3rd and 4th vertebrae right lower back"  . Cardiomyopathy, ischemic     05/2001 - s/p CABG x4, LIMA --> LAD, saphenous vein --> first diagonal artery, saphenous vein --> 2nd diagonal ,  saphenous vein --> RCA  . History of recurrent UTIs   . 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 CRI (chronic renal insufficiency)   . Biventricular implantable cardiac  defibrillator in situ 2007, 2012    s/p device revision secondary to end of life  . Hypoxemia requiring supplemental oxygen   . Candidiasis of vulva and vagina   . Hypothyroidism   . 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 Truett Perna, in remission  . Pneumonia 12/27/11    "3 times; it's been a long time ago"  . OSA on CPAP   . Gout attack ~ 08/2011  . PAF (paroxysmal atrial fibrillation)     on Coumadin  . DM (diabetes mellitus), type 2 with complications     insulin dependent, retinopathy, neuropathy  . Mural thrombus of left ventricle     before 2003, while on Coumadin, No h/o CVA  . Morbid obesity with BMI of 45.0-49.9, adult     Ht. 5'4". BMI 47.2  . Vitamin D deficiency 06/09/2012    historical  . Olecranon bursitis of right elbow 12/29/2012   HOME Dose: Per pt and husband: coumadin 5 mg MWF and 7.5 mg TTSS.   Assessment: 72 yo F admitted for worsening SOB.  Pharmacy has been consulted  to continue her coumadin for afib.  She has a significant PMH for cardiac disease.  Her admission INR was sub-therapeutic at 1.7 and remains low this morning at about the same after her 5 mg dose.  There is no CBC this morning but her H/H and platelets were stable yesterday.    Goal of Therapy:  INR 2-3   Plan:  1. Will give Warfarin 7.5mg  for a couple of days to try and get her to within desired goal range. 2. Daily INR 3.  Will monitor for adverse events and s/s of bleeding.  Nadara Mustard, PharmD., MS Clinical Pharmacist Pager:  [phone] Thank you for allowing pharmacy to be part of this patients care team. 03/06/2013 11:38 AM

## 2013-03-06 NOTE — Progress Notes (Signed)
  Echocardiogram 2D Echocardiogram has been performed.  Arvil Chaco 03/06/2013, 11:52 AM

## 2013-03-06 NOTE — Progress Notes (Addendum)
[facility] Progress Note  TEAM 1 - Stepdown ICU Team   Destiny [name] WUJ:[id number] DOB: 19-Nov-1940 DOA: 03/05/2013 PCP: Danise Edge, MD  Brief narrative: 72 yo female with h/o CHF, s/p cabg, ckd, aicd, on home oxygen 2 L [place]  (this she has increased over the past 2 weeks), was prev only prn during HS with CPAP. Presented with several days of worsening SOB and cough. No CP. No n/v. No LE edema. No cough. No h/o copd. Arrived at urgent care and was found to be hypoxic with sats on RA 75%. Started on oxygen and given lasix 80mg  IV and NTG gtt then transferred to [facility] stepdown as direct admission. After arrival here she endorsed feeling better with improved breathing.    Assessment/Plan: Active Problems: Acute respiratory failure with hypoxia due to:   A) Acute diastolic heart failure   B) History of Cardiomyopathy, ischemic   C) viral bronchitis? -sx's have markedly improved and is now on [place] oxygen -past 2 days sore throat, prod cough w/ green sputum and low grade fevers -suspect has PNA/bronchitis but as Pro calcitonin is low, will not give antibiotics -ECHO last year with preserved LV fnx likely due to med rxn -repeat ECHO reveals systolic CHF with EF 35-40%  And Grade 2 Diastolic dysf - DC IV Lasix in favor of home Demadex - repeat CXR in AM    OSA (obstructive sleep apnea) -husband to bring in home CPAP machine    DIABETES MELLITUS, WITH AUTONOMIC NEUROPATHY -home doses of insulin clarified (pt mixes her regular w/ her NPH) -advance diet    HYPERTENSION -BP soft so cautious use of anti HTN meds    CKD (chronic kidney disease) stage 3, GFR 30-59 ml/min -stable    ATRIAL FIBRILLATION, PAROXYSMAL -rate controlled -rationale for Warfarin (also h/o mural thrombus)   Hypothyroidism -Synthroid    ICD - IN SITU   DVT prophylaxis: Warfarin Code Status: Full Family Communication: Patient and husband at bedside Disposition Plan/Expected LOS:  Stepdown Isolation: None Nutritional Status: Acute protein calorie malnutrition related to acute pulmonary illness for 2 weeks  Consultants: None  Procedures: 2-D echocardiogram pending  Antibiotics: Levaquin 10/24 >>>  HPI/Subjective: Patient alert and endorses marked improvement in respiratory symptoms. No chest pain. No nausea or vomiting or abdominal pain.Coughing up green sputum.   Objective: Blood pressure 116/84, pulse 68, temperature 98.4 F (36.9 C), temperature source Oral, resp. rate 14, height 5' 4.5" (1.638 m), weight 123.5 kg (272 lb 4.3 oz), SpO2 96.00%.  Intake/Output Summary (Last 24 hours) at 03/06/13 1243 Last data filed at 03/06/13 1027  Gross per 24 hour  Intake    129 ml  Output   2350 ml  Net  -2221 ml     Exam: General: No acute respiratory distress Lungs: Crackles L > R without wheezes or crackles, 4L Cardiovascular: Regular rate and rhythm without murmur gallop or rub normal S1 and S2, no peripheral edema or JVD Abdomen: Obese, Nontender, nondistended, soft, bowel sounds positive, no rebound, no ascites, no appreciable mass Musculoskeletal: No significant cyanosis, clubbing of bilateral lower extremities Neurological: Alert and oriented x 3, moves all extremities x 4 without focal neurological deficits, CN 2-12 intact  Scheduled Meds:  Scheduled Meds: . allopurinol  100 mg Oral Daily  . antiseptic oral rinse  15 mL Mouth Rinse BID  . aspirin EC  81 mg Oral Daily  . carvedilol  12.5 mg Oral BID WC  . citalopram  20 mg Oral Daily  .  enalapril  10 mg Oral Daily  . gabapentin  300 mg Oral BID  . insulin aspart  15 Units Subcutaneous Q supper  . [START ON 03/07/2013] insulin aspart  30 Units Subcutaneous Q breakfast  . insulin NPH  25 Units Subcutaneous Q supper  . [START ON 03/07/2013] insulin NPH  40 Units Subcutaneous QAC breakfast  . levofloxacin (LEVAQUIN) IV  500 mg Intravenous Q24H  . levothyroxine  25 mcg Oral QAC breakfast  . LORazepam   0.5 mg Oral QHS  . sodium chloride  3 mL Intravenous Q12H  . torsemide  20 mg Oral BID  . warfarin  7.5 mg Oral ONCE-1800  . Warfarin - Pharmacist Dosing Inpatient   Does not apply q1800   Continuous Infusions:   **Reviewed in detail by the Attending Physician  Data Reviewed: Basic Metabolic Panel:  Recent Labs Lab 03/05/13 1608 03/06/13 0740  NA 137 136  K 5.4* 4.9  CL 98 95*  CO2 31 30  GLUCOSE 148* 194*  BUN 46* 42*  CREATININE 1.60* 1.57*  CALCIUM 10.4 9.3   Liver Function Tests: No results found for this basename: AST, ALT, ALKPHOS, BILITOT, PROT, ALBUMIN,  in the last 168 hours No results found for this basename: LIPASE, AMYLASE,  in the last 168 hours No results found for this basename: AMMONIA,  in the last 168 hours CBC:  Recent Labs Lab 03/05/13 1608  WBC 15.2*  HGB 11.8*  HCT 37.3  MCV 95.4  PLT 183   Cardiac Enzymes:  Recent Labs Lab 03/05/13 1600 03/05/13 2200 03/06/13 0630 03/06/13 0920  TROPONINI <0.30 <0.30 <0.30 <0.30   BNP (last 3 results)  Recent Labs  03/05/13 1608  PROBNP 3671.0*   CBG:  Recent Labs Lab 03/06/13 1013 03/06/13 1203  GLUCAP 331* 283*    Recent Results (from the past 240 hour(s))  MRSA PCR SCREENING     Status: None   Collection Time    03/05/13  8:35 PM      Result Value Range Status   MRSA by PCR NEGATIVE  NEGATIVE Final   Comment:            The GeneXpert MRSA Assay (FDA     approved for NASAL specimens     only), is one component of a     comprehensive MRSA colonization     surveillance program. It is not     intended to diagnose MRSA     infection nor to guide or     monitor treatment for     MRSA infections.     Studies:  Recent x-ray studies have been reviewed in detail by the Attending Physician     Junious Silk, ANP [facility] Office  [phone] Pager [phone]  **If unable to reach the above provider after paging please contact the Flow Manager @  [phone]  On-Call/Text Page:      [email or web address]      password [email or web address]  If 7PM-7AM, please contact night-coverage [email or web address] Password [email or web address] 03/06/2013, 12:43 PM   LOS: 1 day   I have examined the patient, reviewed the chart and modified the above note which I agree with.   Saanya Zieske,MD [phone] 03/06/2013, 2:47 PM

## 2013-03-06 NOTE — Consult Note (Signed)
CARDIOLOGY CONSULT NOTE   Patient ID: RUDOLPH DOBLER MRN: [id number], DOB/AGE: November 18, 1940   Admit date: 03/05/2013 Date of Consult: 03/06/2013  Primary Physician: Danise Edge, MD Primary Cardiologist: Reece Agar. Ladona Ridgel, MD   Pt. Profile  72 y/o female with h/o ICM and mixed syst/diast chf who presented with recurrent chf.  Problem List  Past Medical History  Diagnosis Date  . LBBB (left bundle branch block)   . Carotid bruit     a. 10/19/2011 carotid duplex - Mild hard plaque bilaterally. Stable 40-59% bilateral ICA stenosis.   Marland Kitchen Dyslipidemia   . Hypertension   . Chronic combined systolic and diastolic CHF (congestive heart failure)     a. EF as low as 25% in 2006;  b. EF 60-65% in 12/2011;  c. 02/2013 Echo: EF 35-40, mod mid-dist antsept HK, Gr 2 DD, mild LVH.  . Back pain, chronic     "just when I walk; mass on 3rd and 4th vertebrae right lower back"  . Cardiomyopathy, ischemic     a. 2012 s/p SJM 3231-40 Uni BiV ICD, ser # [id number].  Marland Kitchen CAD (coronary artery disease)     a. 05/2001 CABG x4: LIMA->LAD, VG->D1, VG->D2, VG->RCA.  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 CKD (chronic kidney disease), stage III   . Biventricular implantable cardiac defibrillator in situ 2007, 2012    a. 2007;  b. 2012 Gen change: SJM 3231-40 Uni BiV ICD, ser # [id number].  Marland Kitchen Hypoxemia requiring supplemental oxygen   . Candidiasis of vulva and vagina   . Hypothyroidism   . 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 Truett Perna, in remission  . History of pneumonia 12/27/11    "3 times; it's been a long time ago"  . OSA on CPAP   . Gout ~ 08/2011  . PAF (paroxysmal atrial fibrillation)     on Coumadin  . DM (diabetes mellitus), type 2 with complications     insulin dependent, retinopathy, neuropathy  . Mural thrombus of left ventricle     before 2003, while on Coumadin, No h/o CVA  . Morbid obesity with BMI of 45.0-49.9, adult     Ht. 5'4". BMI  47.2  . Vitamin D deficiency 06/09/2012    historical  . Olecranon bursitis of right elbow 12/29/2012  . History of recurrent UTIs     Past Surgical History  Procedure Laterality Date  . Cardiac catheterization  06/03/01  . Cholecystectomy  1970  . Tubal ligation  1972  . Abdominal hysterectomy  1982  . Insert / replace / remove pacemaker  2007; 2012    w/AICD  . Coronary artery bypass graft  2003    CABG X4  . Cataract extraction      left eye    Allergies  Allergies  Allergen Reactions  . Sulfonamide Derivatives Other (See Comments)    Unknown; "childhood allergy/mother"   HPI   72 y/o female with h/o ICM and mixed systolic and diastolic CHF with EF as low as 25% in 2006.  She also has a h/o CAD and is s/p CABG x 4 in 2003.  Secondary to ICM and LV dysfxn, she underwent ICD placement in 2007.  Last gen change was in 2012 (SJM CRT-D).  EF's over the years have been somewhat variable but on last check in 12/2011, EF was normal.  Despite this, she has chronic DOE.  She notes that about  6 mos ago, 2/2 rising creatinine, her demadex dose was reduced from 30mg  daily to 20mg  bid.  She has been using O2 with her CPAP at night for quite some time and approx 2-3 mos ago, 2/2 daytime doe, she began wearing her O2 24 hrs/day.    Approx 2 wks ago she developed significant sinus and upper resp congestion with productive cough.  This progressed and over the past week or so became associated with progressive DOE, LEE, early satiety, and increasing abdominal girth.  She has also noted weight gain of ~ 5 lbs.  She saw her PCP on 10/23, and in the setting of progressive Ss, was referred to the [facility] ED. There, ECG was non-acute but chest x ray showed central pulmonary edema/CHF.  She was transferred to [facility] for IM admission and has been placed on IV lasix and azithromycin. She has diuresed ~ 2.4 L. Admission weight was 272, which is ~ 4 or 5 lbs above he weight in 09/2012 (at the time her torsemide dose  was reduced).  With diuresis, she has had some symptomatic relief.  She has not had any chest pain leading up to this.  Troponin's are normal.  Inpatient Medications  . allopurinol  100 mg Oral Daily  . antiseptic oral rinse  15 mL Mouth Rinse BID  . aspirin EC  81 mg Oral Daily  . carvedilol  12.5 mg Oral BID WC  . citalopram  20 mg Oral Daily  . enalapril  10 mg Oral Daily  . furosemide  80 mg Intravenous BID  . gabapentin  300 mg Oral BID  . insulin aspart  15 Units Subcutaneous Q supper  . [START ON 03/07/2013] insulin aspart  30 Units Subcutaneous Q breakfast  . insulin NPH  25 Units Subcutaneous Q supper  . [START ON 03/07/2013] insulin NPH  40 Units Subcutaneous QAC breakfast  . levothyroxine  25 mcg Oral QAC breakfast  . LORazepam  0.5 mg Oral QHS  . sodium chloride  3 mL Intravenous Q12H  . Warfarin - Pharmacist Dosing Inpatient   Does not apply q1800   Family History Family History  Problem Relation Age of Onset  . Cancer Mother     kidney and female repo - died @ 68  . Heart disease Father     died @ 7  . Asthma Mother   . Stroke Father     Social History History   Social History  . Marital Status: Married    Spouse Name: N/A    Number of Children: Y  . Years of Education: N/A   Occupational History  . 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    Social History Main Topics  . Smoking status: Never Smoker   . Smokeless tobacco: Never Used  . Alcohol Use: No  . Drug Use: No  . Sexual Activity: Not Currently   Other Topics Concern  . Not on file   Social History Narrative   Lives in [place] with her husband.  She does not routinely exercise or adhere to any particular diet.       Review of Systems  General:   +++chills/fevers prior to admission.  No night sweats or weight changes.  Cardiovascular:  No chest pain, +++ dyspnea on exertion, +++ R>L LE edema, +++ orthopnea, palpitations, paroxysmal nocturnal dyspnea. Dermatological: No rash,  lesions/masses Respiratory: +++ cough, +++ dyspnea Urologic: No hematuria, dysuria Abdominal:   +++ abd bloating and  early satiety.  No nausea, vomiting, diarrhea, bright red blood per rectum, melena, or hematemesis Neurologic:  No visual changes, wkns, changes in mental status. All other systems reviewed and are otherwise negative except as noted above.  Physical Exam  Blood pressure 135/40, pulse 67, temperature 98.9 F (37.2 C), temperature source Oral, resp. rate 16, height 5' 4.5" (1.638 m), weight 272 lb 4.3 oz (123.5 kg), SpO2 95.00%.  General: Pleasant,, morbidly obese 72 yo in  NAD Psych: Normal affect. Neuro: Alert and oriented X 3. Moves all extremities spontaneously. HEENT: Normal  Neck: Supple without bruits.  Difficult to assess JVP 2/2 girth. Lungs:  Resp regular and unlabored, bibasilar crackles.  Faint wheeze Heart: RRR distant. Abdomen: Soft, non-tender, non-distended, BS + x 4.  Extremities: No clubbing, cyanosis or 1+ RLE edema, Trace LLE edema. DP/PT/Radials 2+ and equal bilaterally.  Labs   Recent Labs  03/05/13 1600 03/05/13 2200 03/06/13 0630 03/06/13 0920  TROPONINI <0.30 <0.30 <0.30 <0.30   Lab Results  Component Value Date   WBC 15.2* 03/05/2013   HGB 11.8* 03/05/2013   HCT 37.3 03/05/2013   MCV 95.4 03/05/2013   PLT 183 03/05/2013     Recent Labs Lab 03/06/13 0740  NA 136  K 4.9  CL 95*  CO2 30  BUN 42*  CREATININE 1.57*  CALCIUM 9.3  GLUCOSE 194*   Lab Results  Component Value Date   INR 1.68* 03/06/2013   INR 1.70* 03/05/2013   Radiology/Studies  Dg Chest Port 1 View  03/05/2013   CLINICAL DATA:  Fever. Shortness of breath.  EXAM: PORTABLE CHEST - 1 VIEW  IMPRESSION: Congestive heart failure. There is probable patchy consolidation of bilateral lung bases but evaluation is limited by underpenetration of the lung bases.   Electronically Signed   By: Sherian Rein M.D.   On: 03/05/2013 16:20   ECG  Bi-V paced, underlying  sinus.  ASSESSMENT AND PLAN  1.  Acute on chronic combined systolic/diastolic chf:  Pt presents with progressive doe and O2 requirements over the past 2-3 months culminating in 2 wks of progressive sinus/upper resp congestion with worsening dyspnea, orthopnea, weight gain, early satiety, LEE, and abd bloating.  She has responded well to IV lasix though continues to have evidence of volume overload on exam.  Her weight is still about 4-5 lbs above where she was in 09/2012 (?accuracy of scales). This coincides with decrease in demedex dose.   REcom:   Cont IV diuresis for now, BID.  Renal fxn stable.  Cont bb/acei.   She will benefit from CHF clinic referral given complex co-morbidities and some degree of cardiorenal syndrome.  Her LV fxn has been variable over the years and EF was 35-40% by echo yesterday.  She has not been having chest pain and troponins are normal  With reduced EF, we will plan on outpt MV, as it's been 11 yrs since her CABG.  2.  CAD:  No chest pain.  Troponin's nl.  As above, EF down to 35-40%.  Plan myoview as outpt    Cont asa, bb.  3.  PAF:  In sinus.  Cont bb, coumadin.  INR subRx.  Follow  4.  URI: abx per IM.  5.  CKD III:  Stable.  Follow with IV diuresis.    6.  DM: Per IM.  Signed, Nicolasa Ducking, NP 03/06/2013, 6:23 PM  Pateint seen and examiined.  Agree with Flavia Shipper  I have amended consult note to reflect my  findings.   pateint with signif volume overload  Responded to initial IV lasix  I would continue with IV BID lasix  Not switch to PO yet.  Continue other meds.   Myoview as outpatient given remote revascularization  I am not impressed that above is being driven by acute ischemia.    Dietrich Pates

## 2013-03-07 ENCOUNTER — Inpatient Hospital Stay (HOSPITAL_COMMUNITY): Payer: Medicare Other

## 2013-03-07 DIAGNOSIS — J209 Acute bronchitis, unspecified: Secondary | ICD-10-CM

## 2013-03-07 LAB — CBC
HCT: 34 % — ABNORMAL LOW (ref 36.0–46.0)
Hemoglobin: 11.2 g/dL — ABNORMAL LOW (ref 12.0–15.0)
MCH: 30.9 pg (ref 26.0–34.0)
MCHC: 32.9 g/dL (ref 30.0–36.0)
RBC: 3.63 MIL/uL — ABNORMAL LOW (ref 3.87–5.11)

## 2013-03-07 LAB — BASIC METABOLIC PANEL
BUN: 55 mg/dL — ABNORMAL HIGH (ref 6–23)
CO2: 29 mEq/L (ref 19–32)
Calcium: 8.9 mg/dL (ref 8.4–10.5)
Chloride: 93 mEq/L — ABNORMAL LOW (ref 96–112)
Creatinine, Ser: 1.96 mg/dL — ABNORMAL HIGH (ref 0.50–1.10)
Glucose, Bld: 141 mg/dL — ABNORMAL HIGH (ref 70–99)

## 2013-03-07 LAB — PROTIME-INR
INR: 1.47 (ref 0.00–1.49)
Prothrombin Time: 17.4 seconds — ABNORMAL HIGH (ref 11.6–15.2)

## 2013-03-07 LAB — GLUCOSE, CAPILLARY
Glucose-Capillary: 152 mg/dL — ABNORMAL HIGH (ref 70–99)
Glucose-Capillary: 134 mg/dL — ABNORMAL HIGH (ref 70–99)
Glucose-Capillary: 176 mg/dL — ABNORMAL HIGH (ref 70–99)

## 2013-03-07 MED ORDER — GUAIFENESIN-DM 100-10 MG/5ML PO SYRP
5.0000 mL | ORAL_SOLUTION | ORAL | Status: DC | PRN
  Administered 2013-03-07: 5 mL via ORAL
  Filled 2013-03-07: qty 5

## 2013-03-07 MED ORDER — WARFARIN SODIUM 10 MG PO TABS
10.0000 mg | ORAL_TABLET | Freq: Once | ORAL | Status: AC
  Administered 2013-03-07: 10 mg via ORAL
  Filled 2013-03-07: qty 1

## 2013-03-07 MED ORDER — FUROSEMIDE 10 MG/ML IJ SOLN
80.0000 mg | Freq: Every day | INTRAMUSCULAR | Status: DC
  Administered 2013-03-08: 80 mg via INTRAVENOUS
  Filled 2013-03-07: qty 8

## 2013-03-07 NOTE — Progress Notes (Signed)
ANTICOAGULATION CONSULT NOTE - Follow Up  Pharmacy Consult for coumadin Indication: atrial fibrillation  Allergies  Allergen Reactions  . Sulfonamide Derivatives Other (See Comments)    Unknown; "childhood allergy/mother"   Patient Measurements: Height: 5' 4.5" (163.8 cm) Weight: 271 lb 2.7 oz (123 kg) IBW/kg (Calculated) : 55.85  Vital Signs: Temp: 98.9 F (37.2 C) (10/25 0419) Temp src: Oral (10/25 0419) BP: 125/57 mmHg (10/25 0419) Pulse Rate: 72 (10/25 0419)  Labs:  Recent Labs  03/05/13 1608 03/05/13 2200 03/06/13 0500 03/06/13 0630 03/06/13 0740 03/06/13 0920 03/07/13 0525  HGB 11.8*  --   --   --   --   --  11.2*  HCT 37.3  --   --   --   --   --  34.0*  PLT 183  --   --   --   --   --  195  LABPROT  --  19.5* 19.3*  --   --   --  17.4*  INR  --  1.70* 1.68*  --   --   --  1.47  CREATININE 1.60*  --   --   --  1.57*  --  1.96*  TROPONINI  --  <0.30  --  <0.30  --  <0.30  --    Estimated Creatinine Clearance: 34.9 ml/min (by C-G formula based on Cr of 1.96).  HOME Dose: Per pt and husband: coumadin 5 mg MWF and 7.5 mg TTSS.   Assessment: 72 yo F admitted for worsening SOB.  Pharmacy has been consulted to continue her coumadin for afib.  INR now trending down to 1.4 overnight. Off abx. No bleeding noted.  Goal of Therapy:  INR 2-3   Plan:  1. Will give Warfarin 10mg  tonight 2. Daily INR 3.  Will monitor for adverse events and s/s of bleeding.  Sheppard Coil PharmD., BCPS Clinical Pharmacist Pager [phone] 03/07/2013 8:33 AM

## 2013-03-07 NOTE — Progress Notes (Signed)
SUBJECTIVE:  Feels much better than yesterday. Breathing is better.  OBJECTIVE:   Vitals:   Filed Vitals:   03/07/13 0419 03/07/13 0500 03/07/13 0800 03/07/13 0846  BP: 125/57   139/117  Pulse: 72  64 68  Temp: 98.9 F (37.2 C)   98.6 F (37 C)  TempSrc: Oral   Oral  Resp: 20  19 17   Height:      Weight:  271 lb 2.7 oz (123 kg)    SpO2: 88%  76% 90%   I&O's:   Intake/Output Summary (Last 24 hours) at 03/07/13 1123 Last data filed at 03/06/13 1528  Gross per 24 hour  Intake      0 ml  Output    175 ml  Net   -175 ml   TELEMETRY: Reviewed telemetry pt in atrial fibrillation:     PHYSICAL EXAM General: Well developed, well nourished, in no acute distress Head: Eyes PERRLA, No xanthomas.   Normal cephalic and atramatic  Lungs:  Basilar crackles, no wheezing Heart:  Irregularly irregular, S1 S2  Abdomen:  abdomen soft and non-tender  Msk:   Normal strength and tone for age. Extremities:  One plus, bilateral lower extremity edema.  Warm, erythematous area on the right shin  Neuro: Alert and oriented X 3. Psych:  Normal affect, responds appropriately   LABS: Basic Metabolic Panel:  Recent Labs  16/10/96 0740 03/07/13 0525  NA 136 133*  K 4.9 4.8  CL 95* 93*  CO2 30 29  GLUCOSE 194* 141*  BUN 42* 55*  CREATININE 1.57* 1.96*  CALCIUM 9.3 8.9   Liver Function Tests: No results found for this basename: AST, ALT, ALKPHOS, BILITOT, PROT, ALBUMIN,  in the last 72 hours No results found for this basename: LIPASE, AMYLASE,  in the last 72 hours CBC:  Recent Labs  03/05/13 1608 03/07/13 0525  WBC 15.2* 14.1*  HGB 11.8* 11.2*  HCT 37.3 34.0*  MCV 95.4 93.7  PLT 183 195   Cardiac Enzymes:  Recent Labs  03/05/13 2200 03/06/13 0630 03/06/13 0920  TROPONINI <0.30 <0.30 <0.30   BNP: No components found with this basename: POCBNP,  D-Dimer: No results found for this basename: DDIMER,  in the last 72 hours Hemoglobin A1C:  Recent Labs  03/06/13 1505   HGBA1C 7.3*   Fasting Lipid Panel: No results found for this basename: CHOL, HDL, LDLCALC, TRIG, CHOLHDL, LDLDIRECT,  in the last 72 hours Thyroid Function Tests:  Recent Labs  03/05/13 2200  TSH 1.630   Anemia Panel: No results found for this basename: VITAMINB12, FOLATE, FERRITIN, TIBC, IRON, RETICCTPCT,  in the last 72 hours Coag Panel:   Lab Results  Component Value Date   INR 1.47 03/07/2013   INR 1.68* 03/06/2013   INR 1.70* 03/05/2013   PROTIME 17.9 11/01/2008   PROTIME 15.7 10/18/2008    RADIOLOGY: Dg Chest 2 View  03/07/2013   CLINICAL DATA:  Respiratory failure  EXAM: CHEST  2 VIEW  COMPARISON:  Prior chest x-ray 03/05/2013  FINDINGS: Very low inspiratory volumes. Stable cardiomegaly. Improving pulmonary edema. Stable left subclavian approach cardiac rhythm maintenance device. No pneumothorax or pleural effusion.  IMPRESSION: Improving pulmonary edema but persistent very low inspiratory volumes with bibasilar atelectasis.   Electronically Signed   By: Malachy Moan M.D.   On: 03/07/2013 08:21   Dg Chest Port 1 View  03/05/2013   CLINICAL DATA:  Fever. Shortness of breath.  EXAM: PORTABLE CHEST - 1 VIEW  COMPARISON:  January 28, 2012  FINDINGS: The heart size and mediastinal contours are stable. The heart size is enlarged. Cardiac pacemaker is unchanged. Patient is status post prior median sternotomy. There is central pulmonary edema. There is probable patchy consolidation of bilateral lung bases but evaluation is limited by underpenetration of the lung bases. The visualized skeletal structures are stable.  IMPRESSION: Congestive heart failure. There is probable patchy consolidation of bilateral lung bases but evaluation is limited by underpenetration of the lung bases.   Electronically Signed   By: Sherian Rein M.D.   On: 03/05/2013 16:20      ASSESSMENT: Systolic/diastolic CHF, CAD, :  PLAN:  Cr increased today.  Will decrease Lasix to 80 mg x 1 today.  Dose  already given.  If Cr better tomorrow, could consider giving a dose in the afternoon.  CAD: ruled out for MI. H/o CABG.  Coumadin for AFib. INR Subtherapeutic at this time.  Question cellulitis of the right shin. Will defer to hospitalist. Patient already receiving Levaquin.  Corky Crafts., MD  03/07/2013  11:23 AM

## 2013-03-07 NOTE — Progress Notes (Signed)
[facility] Progress Note [place] TEAM 1 - Stepdown ICU Team   Destiny [name] VHQ:[id number] DOB: [birth year] DOA: 03/05/2013 PCP: Danise Edge, MD  Brief narrative: 72 yo female with h/o CHF, s/p cabg, ckd, aicd, on home oxygen 2 L [place]  (this she has increased over the past 2 weeks), was prev only prn during HS with CPAP. Presented with several days of worsening SOB and cough. No CP. No n/v. No LE edema. No cough. No h/o copd. Arrived at urgent care and was found to be hypoxic with sats on RA 75%. Started on oxygen and given lasix 80mg  IV and NTG gtt then transferred to [facility] stepdown as direct admission. After arrival here she endorsed feeling better with improved breathing.    Assessment/Plan: Active Problems: Acute respiratory failure with hypoxia due to:   A) Acute diastolic heart failure   B) History of Cardiomyopathy, ischemic   C) viral bronchitis? -sx's have markedly improved and is now on [place] oxygen -past 2 days sore throat, prod cough w/ green sputum and low grade fevers -suspect has PNA/bronchitis but as Pro calcitonin is low, will not give antibiotics -ECHO last year with preserved LV fnx likely due to med rxn -repeat ECHO reveals systolic CHF with EF 35-40%  And Grade 2 Diastolic dysf - cardio managing diuretics    OSA (obstructive sleep apnea) -husband to bring in home CPAP machine    DIABETES MELLITUS, WITH AUTONOMIC NEUROPATHY -home doses of insulin clarified (pt mixes her regular w/ her NPH) - sugars stable    HYPERTENSION -BP soft so cautious use of anti HTN meds    CKD (chronic kidney disease) stage 3, GFR 30-59 ml/min -stable    ATRIAL FIBRILLATION, PAROXYSMAL -rate controlled -rationale for Warfarin (also h/o mural thrombus)   Hypothyroidism -Synthroid    ICD - IN SITU   DVT prophylaxis: Warfarin Code Status: Full Family Communication: Patient and husband at bedside Disposition Plan/Expected LOS: Stepdown Isolation: None Nutritional  Status: Acute protein calorie malnutrition related to acute pulmonary illness for 2 weeks  Consultants: None  Procedures: 2-D echocardiogram pending  Antibiotics: Levaquin 10/24 >>>  HPI/Subjective: Patient sitting up in a chair- feels well and has no complaints.    Objective: Blood pressure 135/87, pulse 65, temperature 98.5 F (36.9 C), temperature source Oral, resp. rate 19, height 5' 4.5" (1.638 m), weight 123 kg (271 lb 2.7 oz), SpO2 96.00%. No intake or output data in the 24 hours ending 03/07/13 1536   Exam: General: No acute respiratory distress Lungs: Crackles L > R without wheezes or crackles, 3L Cardiovascular: Regular rate and rhythm without murmur gallop or rub normal S1 and S2, no peripheral edema or JVD Abdomen: Obese, Nontender, nondistended, soft, bowel sounds positive, no rebound, no ascites, no appreciable mass Musculoskeletal: No significant cyanosis, clubbing of bilateral lower extremities Neurological: Alert and oriented x 3, moves all extremities x 4 without focal neurological deficits, CN 2-12 intact  Scheduled Meds:  Scheduled Meds: . allopurinol  100 mg Oral Daily  . antiseptic oral rinse  15 mL Mouth Rinse BID  . aspirin EC  81 mg Oral Daily  . carvedilol  12.5 mg Oral BID WC  . citalopram  20 mg Oral Daily  . enalapril  10 mg Oral Daily  . [START ON 03/08/2013] furosemide  80 mg Intravenous Daily  . gabapentin  300 mg Oral BID  . insulin aspart  15 Units Subcutaneous Q supper  . insulin aspart  30 Units Subcutaneous Q  breakfast  . insulin NPH  25 Units Subcutaneous Q supper  . insulin NPH  40 Units Subcutaneous QAC breakfast  . levothyroxine  25 mcg Oral QAC breakfast  . LORazepam  0.5 mg Oral QHS  . sodium chloride  3 mL Intravenous Q12H  . warfarin  10 mg Oral ONCE-1800  . Warfarin - Pharmacist Dosing Inpatient   Does not apply q1800   Continuous Infusions:   **Reviewed in detail by the Attending Physician  Data Reviewed: Basic  Metabolic Panel:  Recent Labs Lab 03/05/13 1608 03/06/13 0740 03/07/13 0525  NA 137 136 133*  K 5.4* 4.9 4.8  CL 98 95* 93*  CO2 31 30 29   GLUCOSE 148* 194* 141*  BUN 46* 42* 55*  CREATININE 1.60* 1.57* 1.96*  CALCIUM 10.4 9.3 8.9   Liver Function Tests: No results found for this basename: AST, ALT, ALKPHOS, BILITOT, PROT, ALBUMIN,  in the last 168 hours No results found for this basename: LIPASE, AMYLASE,  in the last 168 hours No results found for this basename: AMMONIA,  in the last 168 hours CBC:  Recent Labs Lab 03/05/13 1608 03/07/13 0525  WBC 15.2* 14.1*  HGB 11.8* 11.2*  HCT 37.3 34.0*  MCV 95.4 93.7  PLT 183 195   Cardiac Enzymes:  Recent Labs Lab 03/05/13 1600 03/05/13 2200 03/06/13 0630 03/06/13 0920  TROPONINI <0.30 <0.30 <0.30 <0.30   BNP (last 3 results)  Recent Labs  03/05/13 1608  PROBNP 3671.0*   CBG:  Recent Labs Lab 03/06/13 1203 03/06/13 1713 03/06/13 2135 03/07/13 0843 03/07/13 1232  GLUCAP 283* 239* 157* 152* 134*    Recent Results (from the past 240 hour(s))  MRSA PCR SCREENING     Status: None   Collection Time    03/05/13  8:35 PM      Result Value Range Status   MRSA by PCR NEGATIVE  NEGATIVE Final   Comment:            The GeneXpert MRSA Assay (FDA     approved for NASAL specimens     only), is one component of a     comprehensive MRSA colonization     surveillance program. It is not     intended to diagnose MRSA     infection nor to guide or     monitor treatment for     MRSA infections.     Studies:  Recent x-ray studies have been reviewed in detail by the Attending Physician    Orvis Stann,MD [phone] 03/07/2013, 3:36 PM

## 2013-03-08 ENCOUNTER — Encounter: Payer: Self-pay | Admitting: Family Medicine

## 2013-03-08 DIAGNOSIS — E1139 Type 2 diabetes mellitus with other diabetic ophthalmic complication: Secondary | ICD-10-CM

## 2013-03-08 LAB — GLUCOSE, CAPILLARY
Glucose-Capillary: 83 mg/dL (ref 70–99)
Glucose-Capillary: 159 mg/dL — ABNORMAL HIGH (ref 70–99)

## 2013-03-08 LAB — CBC
HCT: 32 % — ABNORMAL LOW (ref 36.0–46.0)
MCV: 93.3 fL (ref 78.0–100.0)
Platelets: 186 10*3/uL (ref 150–400)
RBC: 3.43 MIL/uL — ABNORMAL LOW (ref 3.87–5.11)
RDW: 15.6 % — ABNORMAL HIGH (ref 11.5–15.5)
WBC: 13.6 10*3/uL — ABNORMAL HIGH (ref 4.0–10.5)

## 2013-03-08 LAB — BASIC METABOLIC PANEL
CO2: 28 mEq/L (ref 19–32)
Calcium: 8.5 mg/dL (ref 8.4–10.5)
Creatinine, Ser: 2.36 mg/dL — ABNORMAL HIGH (ref 0.50–1.10)
GFR calc Af Amer: 23 mL/min — ABNORMAL LOW (ref >90)
GFR calc non Af Amer: 20 mL/min — ABNORMAL LOW (ref >90)
Glucose, Bld: 121 mg/dL — ABNORMAL HIGH (ref 70–99)
Potassium: 4.8 mEq/L (ref 3.5–5.1)
Sodium: 131 mEq/L — ABNORMAL LOW (ref 135–145)

## 2013-03-08 LAB — PROTIME-INR
INR: 1.5 — ABNORMAL HIGH (ref 0.00–1.49)
Prothrombin Time: 17.7 seconds — ABNORMAL HIGH (ref 11.6–15.2)

## 2013-03-08 LAB — PROCALCITONIN: Procalcitonin: 0.25 ng/mL

## 2013-03-08 MED ORDER — BUDESONIDE 0.25 MG/2ML IN SUSP
0.2500 mg | Freq: Two times a day (BID) | RESPIRATORY_TRACT | Status: DC
  Administered 2013-03-08 – 2013-03-13 (×11): 0.25 mg via RESPIRATORY_TRACT
  Filled 2013-03-08 (×13): qty 2

## 2013-03-08 MED ORDER — ALBUTEROL SULFATE (5 MG/ML) 0.5% IN NEBU
2.5000 mg | INHALATION_SOLUTION | Freq: Four times a day (QID) | RESPIRATORY_TRACT | Status: DC
  Administered 2013-03-08 – 2013-03-10 (×9): 2.5 mg via RESPIRATORY_TRACT
  Filled 2013-03-08 (×10): qty 0.5

## 2013-03-08 MED ORDER — POLYETHYLENE GLYCOL 3350 17 G PO PACK
17.0000 g | PACK | Freq: Every day | ORAL | Status: DC
  Administered 2013-03-08 – 2013-03-12 (×4): 17 g via ORAL
  Filled 2013-03-08 (×5): qty 1

## 2013-03-08 MED ORDER — GUAIFENESIN ER 600 MG PO TB12
600.0000 mg | ORAL_TABLET | Freq: Two times a day (BID) | ORAL | Status: DC
  Administered 2013-03-08 – 2013-03-12 (×10): 600 mg via ORAL
  Filled 2013-03-08 (×13): qty 1

## 2013-03-08 MED ORDER — DEXTROMETHORPHAN POLISTIREX 30 MG/5ML PO LQCR
15.0000 mg | Freq: Two times a day (BID) | ORAL | Status: DC
  Administered 2013-03-08 – 2013-03-12 (×9): 15 mg via ORAL
  Administered 2013-03-12: 10:00:00 via ORAL
  Filled 2013-03-08 (×13): qty 5

## 2013-03-08 MED ORDER — BISACODYL 10 MG RE SUPP
10.0000 mg | Freq: Once | RECTAL | Status: AC
  Administered 2013-03-08: 10 mg via RECTAL
  Filled 2013-03-08: qty 1

## 2013-03-08 MED ORDER — WARFARIN SODIUM 10 MG PO TABS
10.0000 mg | ORAL_TABLET | Freq: Once | ORAL | Status: AC
  Administered 2013-03-08: 10 mg via ORAL
  Filled 2013-03-08: qty 1

## 2013-03-08 NOTE — Assessment & Plan Note (Signed)
Multifactorial with several days of worsening sore throat, cough, congestion and long history of pulmonary and cardiac disease. Patient uncomfortable and using accessory muscles for breathing, unable to get O2 to stay in 80s even with Ox at 3 L. Is transferred to the emergency room for admission to hospital

## 2013-03-08 NOTE — Progress Notes (Signed)
SUBJECTIVE:  Feels better than yesterday. Breathing is better.  Still with right leg redness.  OBJECTIVE:   Vitals:   Filed Vitals:   03/07/13 2040 03/08/13 0003 03/08/13 0500 03/08/13 0849  BP: 148/55 137/58  127/104  Pulse: 75 76  72  Temp: 98.9 F (37.2 C) 98.5 F (36.9 C)  98.5 F (36.9 C)  TempSrc: Oral Oral  Oral  Resp: 25 19  19   Height:      Weight:   270 lb 15.1 oz (122.9 kg)   SpO2: 90% 89%  91%   I&O's:    Intake/Output Summary (Last 24 hours) at 03/08/13 1133 Last data filed at 03/08/13 0651  Gross per 24 hour  Intake    360 ml  Output    720 ml  Net   -360 ml   TELEMETRY: Reviewed telemetry pt in atrial fibrillation:     PHYSICAL EXAM General: Well developed, well nourished, in no acute distress Head: Eyes PERRLA, No xanthomas.   Normal cephalic and atramatic  Lungs:  Basilar crackles, no wheezing Heart:  Irregularly irregular, S1 S2  Abdomen:  abdomen soft and non-tender  Msk:   Normal strength and tone for age. Extremities:  One plus, bilateral lower extremity edema.  Warm, erythematous area on the right shin  Neuro: Alert and oriented X 3. Psych:  Normal affect, responds appropriately   LABS: Basic Metabolic Panel:  Recent Labs  16/10/96 0525 03/08/13 0359  NA 133* 131*  K 4.8 4.8  CL 93* 93*  CO2 29 28  GLUCOSE 141* 121*  BUN 55* 72*  CREATININE 1.96* 2.36*  CALCIUM 8.9 8.5   Liver Function Tests: No results found for this basename: AST, ALT, ALKPHOS, BILITOT, PROT, ALBUMIN,  in the last 72 hours No results found for this basename: LIPASE, AMYLASE,  in the last 72 hours CBC:  Recent Labs  03/07/13 0525 03/08/13 0359  WBC 14.1* 13.6*  HGB 11.2* 10.5*  HCT 34.0* 32.0*  MCV 93.7 93.3  PLT 195 186   Cardiac Enzymes:  Recent Labs  03/05/13 2200 03/06/13 0630 03/06/13 0920  TROPONINI <0.30 <0.30 <0.30   BNP: No components found with this basename: POCBNP,  D-Dimer: No results found for this basename: DDIMER,  in the  last 72 hours Hemoglobin A1C:  Recent Labs  03/06/13 1505  HGBA1C 7.3*   Fasting Lipid Panel: No results found for this basename: CHOL, HDL, LDLCALC, TRIG, CHOLHDL, LDLDIRECT,  in the last 72 hours Thyroid Function Tests:  Recent Labs  03/05/13 2200  TSH 1.630   Anemia Panel: No results found for this basename: VITAMINB12, FOLATE, FERRITIN, TIBC, IRON, RETICCTPCT,  in the last 72 hours Coag Panel:   Lab Results  Component Value Date   INR 1.50* 03/08/2013   INR 1.47 03/07/2013   INR 1.68* 03/06/2013   PROTIME 17.9 11/01/2008   PROTIME 15.7 10/18/2008    RADIOLOGY: Dg Chest 2 View  03/07/2013   CLINICAL DATA:  Respiratory failure  EXAM: CHEST  2 VIEW  COMPARISON:  Prior chest x-ray 03/05/2013  FINDINGS: Very low inspiratory volumes. Stable cardiomegaly. Improving pulmonary edema. Stable left subclavian approach cardiac rhythm maintenance device. No pneumothorax or pleural effusion.  IMPRESSION: Improving pulmonary edema but persistent very low inspiratory volumes with bibasilar atelectasis.   Electronically Signed   By: Malachy Moan M.D.   On: 03/07/2013 08:21   Dg Chest Port 1 View  03/05/2013   CLINICAL DATA:  Fever. Shortness of breath.  EXAM: PORTABLE  CHEST - 1 VIEW  COMPARISON:  January 28, 2012  FINDINGS: The heart size and mediastinal contours are stable. The heart size is enlarged. Cardiac pacemaker is unchanged. Patient is status post prior median sternotomy. There is central pulmonary edema. There is probable patchy consolidation of bilateral lung bases but evaluation is limited by underpenetration of the lung bases. The visualized skeletal structures are stable.  IMPRESSION: Congestive heart failure. There is probable patchy consolidation of bilateral lung bases but evaluation is limited by underpenetration of the lung bases.   Electronically Signed   By: Sherian Rein M.D.   On: 03/05/2013 16:20      ASSESSMENT: Systolic/diastolic CHF, CAD, :  PLAN:  Cr  increased today.  Will stop Lasix.  Dose already given.  If Cr better tomorrow, could consider giving an oral dose.  Baseline home oxygen 2-3L/min.  Mild asthma.  CAD: ruled out for MI. H/o CABG.  Coumadin for AFib. INR Subtherapeutic at this time.  Question cellulitis of the right shin. Will defer to hospitalist. Patient already receiving Levaquin.  Corky Crafts., MD  03/08/2013  11:33 AM

## 2013-03-08 NOTE — Progress Notes (Signed)
[facility] Progress Note [place] TEAM 1 - Stepdown ICU Team   Destiny [name]:[id number] DOB: Apr 03, 1941 DOA: 03/05/2013 PCP: Danise Edge, MD  Brief narrative: 72 yo female with h/o CHF, s/p cabg, ckd, aicd, on home oxygen 2 L [place]  (this she has increased over the past 2 weeks), was prev only prn during HS with CPAP. Presented with several days of worsening SOB and cough. No CP. No n/v. No LE edema. No cough. No h/o copd. Arrived at urgent care and was found to be hypoxic with sats on RA 75%. Started on oxygen and given lasix 80mg  IV and NTG gtt then transferred to [facility] stepdown as direct admission. After arrival here she endorsed feeling better with improved breathing.    Assessment/Plan: Active Problems: Acute respiratory failure with hypoxia due to:   A) Acute diastolic heart failure   B) History of Cardiomyopathy, ischemic   C) viral bronchitis?   D) likely OHS   E) OSA -sx's have markedly improved and is now on [place] oxygen -past 2 days sore throat, prod cough w/ green sputum and low grade fevers -suspect has PNA/bronchitis but as Pro calcitonin is low, will not give antibiotics -ECHO last year with preserved LV fnx likely due to med rxn -repeat ECHO reveals systolic CHF with EF 35-40%  And Grade 2 Diastolic dysf - cardio managing diuretics  ARF - due to diuretics- Lasix stopped today    OSA (obstructive sleep apnea) -husband has brought  in home CPAP machine- staff decided not to use it last night- no notes in computer as to why-     DIABETES MELLITUS, WITH AUTONOMIC NEUROPATHY -home doses of insulin clarified (pt mixes her regular w/ her NPH) - sugars stable    HYPERTENSION -BP soft so cautious use of anti HTN meds    CKD (chronic kidney disease) stage 3, GFR 30-59 ml/min -stable    ATRIAL FIBRILLATION, PAROXYSMAL -rate controlled -rationale for Warfarin (also h/o mural thrombus)   Hypothyroidism -Synthroid  Cellulitis of right leg? - follow in  Levqauin     ICD - IN SITU   DVT prophylaxis: Warfarin Code Status: Full Family Communication: Patient and husband at bedside Disposition Plan/Expected LOS: Stepdown Isolation: None Nutritional Status: Acute protein calorie malnutrition related to acute pulmonary illness for 2 weeks  Consultants: None  Procedures: 2-D echocardiogram pending  Antibiotics: Levaquin 10/24 >>>  HPI/Subjective: Patient sitting up in a chair-mostly sleeping- staff did not allow pt to have CPAP last night-   Objective: Blood pressure 138/40, pulse 73, temperature 98.8 F (37.1 C), temperature source Oral, resp. rate 30, height 5' 4.5" (1.638 m), weight 122.9 kg (270 lb 15.1 oz), SpO2 94.00%.  Intake/Output Summary (Last 24 hours) at 03/08/13 1637 Last data filed at 03/08/13 1635  Gross per 24 hour  Intake    240 ml  Output    870 ml  Net   -630 ml     Exam: General: No acute respiratory distress Lungs: Crackles L > R without wheezes or crackles, 4L Cardiovascular: Regular rate and rhythm without murmur gallop or rub normal S1 and S2, no peripheral edema or JVD Abdomen: Obese, Nontender, nondistended, soft, bowel sounds positive, no rebound, no ascites, no appreciable mass Musculoskeletal: No significant cyanosis, clubbing of bilateral lower extremities- b/l edema- some erythema of right ant leg-  Neurological: sleepy but easily awakened-  Alert and oriented x 3, moves all extremities x 4 without focal neurological deficits, CN 2-12 intact  Scheduled Meds:  Scheduled Meds: . albuterol  2.5 mg Nebulization Q6H  . allopurinol  100 mg Oral Daily  . antiseptic oral rinse  15 mL Mouth Rinse BID  . aspirin EC  81 mg Oral Daily  . bisacodyl  10 mg Rectal Once  . budesonide (PULMICORT) nebulizer solution  0.25 mg Nebulization BID  . carvedilol  12.5 mg Oral BID WC  . citalopram  20 mg Oral Daily  . dextromethorphan  15 mg Oral BID  . enalapril  10 mg Oral Daily  . gabapentin  300 mg Oral BID    . guaiFENesin  600 mg Oral BID  . insulin aspart  15 Units Subcutaneous Q supper  . insulin aspart  30 Units Subcutaneous Q breakfast  . insulin NPH  25 Units Subcutaneous Q supper  . insulin NPH  40 Units Subcutaneous QAC breakfast  . levothyroxine  25 mcg Oral QAC breakfast  . LORazepam  0.5 mg Oral QHS  . polyethylene glycol  17 g Oral Daily  . sodium chloride  3 mL Intravenous Q12H  . warfarin  10 mg Oral ONCE-1800  . Warfarin - Pharmacist Dosing Inpatient   Does not apply q1800   Continuous Infusions:   **Reviewed in detail by the Attending Physician  Data Reviewed: Basic Metabolic Panel:  Recent Labs Lab 03/05/13 1608 03/06/13 0740 03/07/13 0525 03/08/13 0359  NA 137 136 133* 131*  K 5.4* 4.9 4.8 4.8  CL 98 95* 93* 93*  CO2 31 30 29 28   GLUCOSE 148* 194* 141* 121*  BUN 46* 42* 55* 72*  CREATININE 1.60* 1.57* 1.96* 2.36*  CALCIUM 10.4 9.3 8.9 8.5   Liver Function Tests: No results found for this basename: AST, ALT, ALKPHOS, BILITOT, PROT, ALBUMIN,  in the last 168 hours No results found for this basename: LIPASE, AMYLASE,  in the last 168 hours No results found for this basename: AMMONIA,  in the last 168 hours CBC:  Recent Labs Lab 03/05/13 1608 03/07/13 0525 03/08/13 0359  WBC 15.2* 14.1* 13.6*  HGB 11.8* 11.2* 10.5*  HCT 37.3 34.0* 32.0*  MCV 95.4 93.7 93.3  PLT 183 195 186   Cardiac Enzymes:  Recent Labs Lab 03/05/13 1600 03/05/13 2200 03/06/13 0630 03/06/13 0920  TROPONINI <0.30 <0.30 <0.30 <0.30   BNP (last 3 results)  Recent Labs  03/05/13 1608  PROBNP 3671.0*   CBG:  Recent Labs Lab 03/07/13 1232 03/07/13 1717 03/07/13 2322 03/08/13 0848 03/08/13 1150  GLUCAP 134* 176* 83 159* 139*    Recent Results (from the past 240 hour(s))  MRSA PCR SCREENING     Status: None   Collection Time    03/05/13  8:35 PM      Result Value Range Status   MRSA by PCR NEGATIVE  NEGATIVE Final   Comment:            The GeneXpert MRSA Assay  (FDA     approved for NASAL specimens     only), is one component of a     comprehensive MRSA colonization     surveillance program. It is not     intended to diagnose MRSA     infection nor to guide or     monitor treatment for     MRSA infections.     Studies:  Recent x-ray studies have been reviewed in detail by the Attending Physician    Candon Caras,MD [phone] 03/08/2013, 4:37 PM

## 2013-03-08 NOTE — Progress Notes (Signed)
ANTICOAGULATION CONSULT NOTE  Pharmacy Consult for coumadin Indication: atrial fibrillation/history of mural thrombus  Allergies  Allergen Reactions  . Sulfonamide Derivatives Other (See Comments)    Unknown; "childhood allergy/mother"   Patient Measurements: Height: 5' 4.5" (163.8 cm) Weight: 270 lb 15.1 oz (122.9 kg) IBW/kg (Calculated) : 55.85  Vital Signs: Temp: 98.5 F (36.9 C) (10/26 0003) Temp src: Oral (10/26 0003) BP: 137/58 mmHg (10/26 0003) Pulse Rate: 76 (10/26 0003)  Labs:  Recent Labs  03/05/13 1608  03/05/13 2200 03/06/13 0500 03/06/13 0630 03/06/13 0740 03/06/13 0920 03/07/13 0525 03/08/13 0359  HGB 11.8*  --   --   --   --   --   --  11.2* 10.5*  HCT 37.3  --   --   --   --   --   --  34.0* 32.0*  PLT 183  --   --   --   --   --   --  195 186  LABPROT  --   < > 19.5* 19.3*  --   --   --  17.4* 17.7*  INR  --   < > 1.70* 1.68*  --   --   --  1.47 1.50*  CREATININE 1.60*  --   --   --   --  1.57*  --  1.96* 2.36*  TROPONINI  --   --  <0.30  --  <0.30  --  <0.30  --   --   < > = values in this interval not displayed. Estimated Creatinine Clearance: 29 ml/min (by C-G formula based on Cr of 2.36).  HOME Dose: Per pt and husband: coumadin 5 mg MWF and 7.5 mg TTSS.   Assessment: 72 yo F admitted for worsening SOB.  Pharmacy has been consulted to continue her coumadin for afib and hx of mural thrombus.  INR slight trend up this morning to 1.5. Off abx. No bleeding noted. Will repeat higher dose tonight.  Goal of Therapy:  INR 2-3   Plan:  1. Will give Warfarin 10mg  tonight 2. Daily INR 3.  Will monitor for adverse events and s/s of bleeding.  Sheppard Coil PharmD., BCPS Clinical Pharmacist Pager [phone] 03/08/2013 8:32 AM

## 2013-03-08 NOTE — Assessment & Plan Note (Signed)
Elevated today

## 2013-03-08 NOTE — Progress Notes (Signed)
[name], female   DOB: [birth year], 72 y.o.   MRN: [id number] PATSYE SULLIVANT [id number] [birth year] 03/08/2013      Progress Note-Follow Up  Subjective  Chief Complaint  Chief Complaint  Patient presents with  . Sore Throat    X 4 day  . Cough    w/ green phlegm X 4 days  . Nasal Congestion    chest and head X 4 days    HPI  Patient is a 72 year old Caucasian female who is in today with several days worth of worsening sore throat, cough and congestion. For shortness of breath has been worsening over several weeks. At this point she has trouble finding a comfortable position is unable to lie down and sleep secondary to her shortness of breath. Denies chest pain or palpitations. No obvious fever but endorses anorexia. No nausea vomiting or GI complaints.  Past Medical History  Diagnosis Date  . LBBB (left bundle branch block)   . Carotid bruit     a. 10/19/2011 carotid duplex - Mild hard plaque bilaterally. Stable 40-59% bilateral ICA stenosis.   Marland Kitchen Dyslipidemia   . Hypertension   . Chronic combined systolic and diastolic CHF (congestive heart failure)     a. EF as low as 25% in 2006;  b. EF 60-65% in 12/2011;  c. 02/2013 Echo: EF 35-40, mod mid-dist antsept HK, Gr 2 DD, mild LVH.  . Back pain, chronic     "just when I walk; mass on 3rd and 4th vertebrae right lower back"  . Cardiomyopathy, ischemic     a. 2012 s/p SJM 3231-40 Uni BiV ICD, ser # [id number].  Marland Kitchen CAD (coronary artery disease)     a. 05/2001 CABG x4: LIMA->LAD, VG->D1, VG->D2, VG->RCA.  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 CKD (chronic kidney disease), stage III   . Biventricular implantable cardiac defibrillator in situ 2007, 2012    a. 2007;  b. 2012 Gen change: SJM 3231-40 Uni BiV ICD, ser # [id number].  Marland Kitchen Hypoxemia requiring supplemental oxygen   . Candidiasis of vulva and vagina   . Hypothyroidism   . 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 Truett Perna, in remission  . History of pneumonia 12/27/11    "3 times; it's been a long time ago"  . OSA on CPAP   . Gout ~ 08/2011  . PAF (paroxysmal atrial fibrillation)     on Coumadin  . DM (diabetes mellitus), type 2 with complications     insulin dependent, retinopathy, neuropathy  . Mural thrombus of left ventricle     before 2003, while on Coumadin, No h/o CVA  . Morbid obesity with BMI of 45.0-49.9, adult     Ht. 5'4". BMI 47.2  . Vitamin D deficiency 06/09/2012    historical  . Olecranon bursitis of right elbow 12/29/2012  . History of recurrent UTIs     Past Surgical History  Procedure Laterality Date  . Cardiac catheterization  06/03/01  . Cholecystectomy  1970  . Tubal ligation  1972  . Abdominal hysterectomy  1982  . Insert / replace / remove pacemaker  2007; 2012    w/AICD  . Coronary artery bypass graft  2003    CABG X4  . Cataract extraction      left eye    Family History  Problem Relation Age of Onset  . Cancer Mother  kidney and female repo - died @ 45  . Heart disease Father     died @ 31  . Asthma Mother   . Stroke Father     History   Social History  . Marital Status: Married    Spouse Name: N/A    Number of Children: Y  . Years of Education: N/A   Occupational History  . 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    Social History Main Topics  . Smoking status: Never Smoker   . Smokeless tobacco: Never Used  . Alcohol Use: No  . Drug Use: No  . Sexual Activity: Not Currently   Other Topics Concern  . Not on file   Social History Narrative   Lives in [place] with her husband.  She does not routinely exercise or adhere to any particular diet.      No current facility-administered medications on file prior to visit.   Current Outpatient Prescriptions on File Prior to Visit  Medication Sig Dispense Refill  . albuterol (PROVENTIL HFA;VENTOLIN HFA) 108 (90 BASE) MCG/ACT inhaler Inhale 2 puffs into the lungs every 6 (six)  hours as needed for wheezing or shortness of breath.  1 Inhaler  1  . amLODipine (NORVASC) 5 MG tablet Take 1 tablet (5 mg total) by mouth daily.  30 tablet  3  . aspirin EC 81 MG tablet Take 81 mg by mouth daily.      . citalopram (CELEXA) 20 MG tablet Take 1 tablet (20 mg total) by mouth daily.  30 tablet  3  . enalapril (VASOTEC) 10 MG tablet Take 1 tablet (10 mg total) by mouth daily.  30 tablet  3  . gabapentin (NEURONTIN) 300 MG capsule Take 1 capsule (300 mg total) by mouth 2 (two) times daily.  60 capsule  2  . Multiple Vitamins-Minerals (MULTIVITAMIN WITH MINERALS) tablet Take 1 tablet by mouth daily.        . rosuvastatin (CRESTOR) 10 MG tablet Take 1 tablet (10 mg total) by mouth daily.  30 tablet  6  . torsemide (DEMADEX) 20 MG tablet Take 1 tablet (20 mg total) by mouth 2 (two) times daily.  120 tablet  2  . traMADol (ULTRAM) 50 MG tablet Take 1 tablet (50 mg total) by mouth 2 (two) times daily as needed for pain.  60 tablet  1    Allergies  Allergen Reactions  . Sulfonamide Derivatives Other (See Comments)    Unknown; "childhood allergy/mother"    Review of Systems  Review of Systems  Constitutional: Positive for malaise/fatigue. Negative for fever.  HENT: Positive for congestion and sore throat.   Eyes: Negative for discharge.  Respiratory: Positive for cough, sputum production and shortness of breath.   Cardiovascular: Negative for chest pain, palpitations and leg swelling.  Gastrointestinal: Negative for nausea, abdominal pain and diarrhea.  Genitourinary: Negative for dysuria.  Musculoskeletal: Positive for myalgias. Negative for falls.  Skin: Negative for rash.  Neurological: Negative for loss of consciousness and headaches.  Endo/Heme/Allergies: Negative for polydipsia.  Psychiatric/Behavioral: Negative for depression and suicidal ideas. The patient is not nervous/anxious and does not have insomnia.     Objective  BP 164/62  Pulse 79  Temp(Src) 99.1 F (37.3  C) (Oral)  Ht 5\' 4"  (1.626 m)  SpO2 83%  Physical Exam  Physical Exam  Constitutional: She is oriented to person, place, and time and well-developed, well-nourished, and in no distress. No distress.  HENT:  Head: Normocephalic and atraumatic.  Eyes: Conjunctivae are normal.  Neck: Neck supple. No thyromegaly present.  Cardiovascular: Normal rate, regular rhythm and normal heart sounds.   Pulmonary/Chest: She is in respiratory distress. She has wheezes.  Using accessory muscles for respiration.   Abdominal: She exhibits no distension and no mass.  Musculoskeletal: She exhibits no edema.  Lymphadenopathy:    She has no cervical adenopathy.  Neurological: She is alert and oriented to person, place, and time.  Skin: Skin is warm and dry. No rash noted. She is not diaphoretic.  Psychiatric: Memory, affect and judgment normal.    Lab Results  Component Value Date   TSH 1.630 03/05/2013   Lab Results  Component Value Date   WBC 13.6* 03/08/2013   HGB 10.5* 03/08/2013   HCT 32.0* 03/08/2013   MCV 93.3 03/08/2013   PLT 186 03/08/2013   Lab Results  Component Value Date   CREATININE 2.36* 03/08/2013   BUN 72* 03/08/2013   NA 131* 03/08/2013   K 4.8 03/08/2013   CL 93* 03/08/2013   CO2 28 03/08/2013   Lab Results  Component Value Date   ALT 14 12/22/2012   AST 17 12/22/2012   ALKPHOS 59 12/22/2012   BILITOT 0.5 12/22/2012   Lab Results  Component Value Date   CHOL 165 09/29/2012   Lab Results  Component Value Date   HDL 38.30* 09/29/2012   Lab Results  Component Value Date   LDLCALC  Value: UNABLE TO CALCULATE IF TRIGLYCERIDE OVER 400 mg/dL        Total Cholesterol/HDL:CHD Risk Coronary Heart Disease Risk Table                     Men   Women  1/2 Average Risk   3.4   3.3  Average Risk       5.0   4.4  2 X Average Risk   9.6   7.1  3 X Average Risk  23.4   11.0        Use the calculated Patient Ratio above and the CHD Risk Table to determine the patient's CHD Risk.         ATP III CLASSIFICATION (LDL):  <100     mg/dL   Optimal  161-096  mg/dL   Near or Above                    Optimal  130-159  mg/dL   Borderline  045-409  mg/dL   High  >811     mg/dL   Very High 01/12/4781   Lab Results  Component Value Date   TRIG 394.0* 09/29/2012   Lab Results  Component Value Date   CHOLHDL 4 09/29/2012     Assessment & Plan  HYPERTENSION Elevated today  Acute respiratory failure with hypoxia Multifactorial with several days of worsening sore throat, cough, congestion and long history of pulmonary and cardiac disease. Patient uncomfortable and using accessory muscles for breathing, unable to get O2 to stay in 80s even with Ox at 3 L. Is transferred to the emergency room for admission to hospital

## 2013-03-09 DIAGNOSIS — E1149 Type 2 diabetes mellitus with other diabetic neurological complication: Secondary | ICD-10-CM

## 2013-03-09 LAB — GLUCOSE, CAPILLARY
Glucose-Capillary: 112 mg/dL — ABNORMAL HIGH (ref 70–99)
Glucose-Capillary: 312 mg/dL — ABNORMAL HIGH (ref 70–99)
Glucose-Capillary: 270 mg/dL — ABNORMAL HIGH (ref 70–99)
Glucose-Capillary: 167 mg/dL — ABNORMAL HIGH (ref 70–99)

## 2013-03-09 LAB — BASIC METABOLIC PANEL
CO2: 28 mEq/L (ref 19–32)
Chloride: 90 mEq/L — ABNORMAL LOW (ref 96–112)
GFR calc Af Amer: 21 mL/min — ABNORMAL LOW (ref >90)
Glucose, Bld: 245 mg/dL — ABNORMAL HIGH (ref 70–99)
Potassium: 4.9 mEq/L (ref 3.5–5.1)
Sodium: 130 mEq/L — ABNORMAL LOW (ref 135–145)

## 2013-03-09 LAB — PROTIME-INR: Prothrombin Time: 19.1 seconds — ABNORMAL HIGH (ref 11.6–15.2)

## 2013-03-09 MED ORDER — WARFARIN SODIUM 2.5 MG PO TABS
12.5000 mg | ORAL_TABLET | Freq: Once | ORAL | Status: AC
  Administered 2013-03-09: 12.5 mg via ORAL
  Filled 2013-03-09 (×2): qty 1

## 2013-03-09 MED ORDER — ACETAMINOPHEN 325 MG PO TABS
650.0000 mg | ORAL_TABLET | ORAL | Status: DC | PRN
  Administered 2013-03-09: 650 mg via ORAL
  Filled 2013-03-09 (×2): qty 2

## 2013-03-09 MED ORDER — SODIUM CHLORIDE 0.9 % IV BOLUS (SEPSIS)
500.0000 mL | Freq: Once | INTRAVENOUS | Status: AC
  Administered 2013-03-09: 500 mL via INTRAVENOUS

## 2013-03-09 MED ORDER — INSULIN NPH (HUMAN) (ISOPHANE) 100 UNIT/ML ~~LOC~~ SUSP
45.0000 [IU] | Freq: Every day | SUBCUTANEOUS | Status: DC
  Administered 2013-03-10 – 2013-03-11 (×2): 45 [IU] via SUBCUTANEOUS
  Filled 2013-03-09: qty 10

## 2013-03-09 MED ORDER — INSULIN NPH (HUMAN) (ISOPHANE) 100 UNIT/ML ~~LOC~~ SUSP
30.0000 [IU] | Freq: Every day | SUBCUTANEOUS | Status: DC
  Administered 2013-03-09 – 2013-03-12 (×4): 30 [IU] via SUBCUTANEOUS
  Filled 2013-03-09: qty 10

## 2013-03-09 NOTE — Progress Notes (Signed)
ANTICOAGULATION CONSULT NOTE  Pharmacy Consult for coumadin Indication: atrial fibrillation/history of mural thrombus  Allergies  Allergen Reactions  . Sulfonamide Derivatives Other (See Comments)    Unknown; "childhood allergy/mother"   Patient Measurements: Height: 5' 4.5" (163.8 cm) Weight: 274 lb 4 oz (124.4 kg) IBW/kg (Calculated) : 55.85  Vital Signs: Temp: 97.5 F (36.4 C) (10/27 0805) Temp src: Oral (10/27 0805) BP: 121/87 mmHg (10/27 0805) Pulse Rate: 63 (10/27 0805)  Labs:  Recent Labs  03/07/13 0525 03/08/13 0359 03/09/13 0440  HGB 11.2* 10.5*  --   HCT 34.0* 32.0*  --   PLT 195 186  --   LABPROT 17.4* 17.7* 19.1*  INR 1.47 1.50* 1.66*  CREATININE 1.96* 2.36*  --    Estimated Creatinine Clearance: 29.2 ml/min (by C-G formula based on Cr of 2.36).  HOME Dose: Per pt and husband: coumadin 5 mg MWF and 7.5 mg TTSS.   Assessment: 72 yo F admitted for worsening SOB.  Pharmacy has been consulted to continue her coumadin for afib and hx of mural thrombus.  INR continues to slowly trend up, 1.66 this morning. Now off abx. No bleeding noted. Will increase dose tonight.  Goal of Therapy:  INR 2-3   Plan:  1. Will give Warfarin 12.5mg  tonight 2. Daily INR 3.  Will monitor for adverse events and s/s of bleeding.  Sheppard Coil PharmD., BCPS Clinical Pharmacist Pager [phone] 03/09/2013 9:52 AM

## 2013-03-09 NOTE — Progress Notes (Signed)
[facility] Progress Note [place] TEAM 1 - Stepdown ICU Team   Destiny [name] ZOX:[id number] DOB: 18-Nov-1940 DOA: 03/05/2013 PCP: Danise Edge, MD  Brief narrative: 72 yo female with h/o CHF, s/p cabg, ckd, aicd, on home oxygen 2 L [place]  (this she has increased over the past 2 weeks), was prev only prn during HS with CPAP. Presented with several days of worsening SOB and cough. No CP. No n/v. No LE edema. No cough. No h/o copd. Arrived at urgent care and was found to be hypoxic with sats on RA 75%. Started on oxygen and given lasix 80mg  IV and NTG gtt then transferred to [facility] stepdown as direct admission.    Assessment/Plan:  Acute respiratory failure with hypoxia due to:   A) Acute diastolic heart failure   B) History of Cardiomyopathy, ischemic   C) likely OHS   D) OSA -sx's have markedly improved/stable on [employer] oxygen -ECHO last year with preserved LV fnx likely due to med rxn but repeat ECHO revealed systolic CHF with EF 35-40% with Grade 2 Diastolic dysf - cardio managing diuretics - dc'd 10/27 due to worsening renal function  ARF on CKD (chronic kidney disease) stage 3, GFR 30-59 ml/min - due to diuretics - Lasix stopped 10/27- repeat BMET 10/28 demonstrated worsened renal function (91/2.58) - one time bolus today - check BMET in am    OSA (obstructive sleep apnea) -husband brought  in home CPAP machine    DIABETES MELLITUS, WITH AUTONOMIC NEUROPATHY - home doses of insulin clarified (pt mixes her regular w/ her NPH) - CBG poorly controlled - adjust tx and follow  - A1c elevated at 7.3    HYPERTENSION -BP has been occasionally soft so cautious use of anti HTN meds    ATRIAL FIBRILLATION, PAROXYSMAL -rate controlled -rationale for Warfarin (also h/o mural thrombus)   Hypothyroidism -cont usual Synthroid  Cellulitis of right leg - follow on Levqauin - exam much improved today, now only suggestive of leukocytoclastic vasculitis     ICD - IN SITU  DVT  prophylaxis: Warfarin Code Status: Full Family Communication: Patient at bedside Disposition Plan/Expected LOS: Transfer to Telemetry - begin PT/OT - follow renal function   Consultants: Cardiology   Procedures: 2-D echocardiogram  - Left ventricle: The cavity size was at the upper limits of normal. Wall thickness was increased in a pattern of mild LVH. Systolic function was moderately reduced. The estimated ejection fraction was in the range of 35% to 40%. There is moderate hypokinesis of the mid-distalanteroseptal myocardium. Features are consistent with a pseudonormal left ventricular filling pattern, with concomitant abnormal relaxation and increased filling pressure (grade 2 diastolic dysfunction). - Mitral valve: Calcified annulus. - Left atrium: The atrium was mildly dilated. - Right atrium: The atrium was mildly dilated.  Antibiotics: Levaquin 10/24 >>>  HPI/Subjective: Patient in bed- endorsed fatigue but o/w stable.  Feels R LE is much improved.  Denies cp or f/c.    Objective: Blood pressure 121/87, pulse 63, temperature 97.5 F (36.4 C), temperature source Oral, resp. rate 16, height 5' 4.5" (1.638 m), weight 124.4 kg (274 lb 4 oz), SpO2 95.00%.  Intake/Output Summary (Last 24 hours) at 03/09/13 1102 Last data filed at 03/08/13 2039  Gross per 24 hour  Intake      0 ml  Output    400 ml  Net   -400 ml   Exam: General: No acute respiratory distress Lungs: distant bs th/o all fields - no focal crackles -  no wheeze  Cardiovascular: Regular rate without murmur gallop or rub  Abdomen: Obese, Nontender, nondistended, soft, bowel sounds positive, no rebound, no ascites, no appreciable mass Musculoskeletal: No significant cyanosis, clubbing of bilateral lower extremities - some erythema of right ant leg - vasculitic in appearance   Scheduled Meds:  Scheduled Meds: . albuterol  2.5 mg Nebulization Q6H  . allopurinol  100 mg Oral Daily  . antiseptic oral rinse  15  mL Mouth Rinse BID  . aspirin EC  81 mg Oral Daily  . budesonide (PULMICORT) nebulizer solution  0.25 mg Nebulization BID  . carvedilol  12.5 mg Oral BID WC  . citalopram  20 mg Oral Daily  . dextromethorphan  15 mg Oral BID  . enalapril  10 mg Oral Daily  . gabapentin  300 mg Oral BID  . guaiFENesin  600 mg Oral BID  . insulin aspart  15 Units Subcutaneous Q supper  . insulin aspart  30 Units Subcutaneous Q breakfast  . insulin NPH  25 Units Subcutaneous Q supper  . insulin NPH  40 Units Subcutaneous QAC breakfast  . levothyroxine  25 mcg Oral QAC breakfast  . LORazepam  0.5 mg Oral QHS  . polyethylene glycol  17 g Oral Daily  . sodium chloride  3 mL Intravenous Q12H  . warfarin  12.5 mg Oral ONCE-1800  . Warfarin - Pharmacist Dosing Inpatient   Does not apply q1800   Data Reviewed: Basic Metabolic Panel:  Recent Labs Lab 03/05/13 1608 03/06/13 0740 03/07/13 0525 03/08/13 0359  NA 137 136 133* 131*  K 5.4* 4.9 4.8 4.8  CL 98 95* 93* 93*  CO2 31 30 29 28   GLUCOSE 148* 194* 141* 121*  BUN 46* 42* 55* 72*  CREATININE 1.60* 1.57* 1.96* 2.36*  CALCIUM 10.4 9.3 8.9 8.5   Liver Function Tests: No results found for this basename: AST, ALT, ALKPHOS, BILITOT, PROT, ALBUMIN,  in the last 168 hours  CBC:  Recent Labs Lab 03/05/13 1608 03/07/13 0525 03/08/13 0359  WBC 15.2* 14.1* 13.6*  HGB 11.8* 11.2* 10.5*  HCT 37.3 34.0* 32.0*  MCV 95.4 93.7 93.3  PLT 183 195 186   Cardiac Enzymes:  Recent Labs Lab 03/05/13 1600 03/05/13 2200 03/06/13 0630 03/06/13 0920  TROPONINI <0.30 <0.30 <0.30 <0.30   BNP (last 3 results)  Recent Labs  03/05/13 1608  PROBNP 3671.0*   CBG:  Recent Labs Lab 03/08/13 0848 03/08/13 1150 03/08/13 1636 03/08/13 2139 03/09/13 0820  GLUCAP 159* 139* 202* 204* 112*    Recent Results (from the past 240 hour(s))  MRSA PCR SCREENING     Status: None   Collection Time    03/05/13  8:35 PM      Result Value Range Status   MRSA by  PCR NEGATIVE  NEGATIVE Final   Comment:            The GeneXpert MRSA Assay (FDA     approved for NASAL specimens     only), is one component of a     comprehensive MRSA colonization     surveillance program. It is not     intended to diagnose MRSA     infection nor to guide or     monitor treatment for     MRSA infections.     Studies:  Recent x-ray studies have been reviewed in detail by the Attending Physician  ELLIS,ALLISON L., ANP [phone] 03/09/2013, 11:02 AM  I have personally examined this patient  and reviewed the entire database. I have reviewed the above note, made any necessary editorial changes, and agree with its content.  Cherene Altes, MD [facility]

## 2013-03-09 NOTE — Progress Notes (Signed)
Notified attending MD of patient having a low-greade temp of 100.1. Patient states she is very cold. Patient has been up in the chair all day up until this evening. Husband at bedside. Will continue to monitor to end of shift.

## 2013-03-09 NOTE — Progress Notes (Signed)
Report given to Rashida 4E nurse, patient will be transfer soon.

## 2013-03-09 NOTE — Progress Notes (Signed)
RT placed patient on their home cpap via nasal pillows with 6lpm 02 bleed in to maintain a 94% sat. RT will continue to monitor.

## 2013-03-09 NOTE — Progress Notes (Signed)
New orders given to administer tylenol as needed for fever. Tylenol given per PRN order for fever of 100.1. Patient states that 100.1 is considered very high for her. Will notify oncoming nurse and continue to monitor patient to end of shift.

## 2013-03-09 NOTE — Progress Notes (Signed)
Patient transferred from 2600 to room 4E20. Patient oriented to room and equipment/call bell system. VSS and currently patient complains of no pain or discomfort. Patient sitting in chair. Will continue to monitor to end of shift.

## 2013-03-10 DIAGNOSIS — E039 Hypothyroidism, unspecified: Secondary | ICD-10-CM

## 2013-03-10 DIAGNOSIS — N390 Urinary tract infection, site not specified: Secondary | ICD-10-CM

## 2013-03-10 DIAGNOSIS — I10 Essential (primary) hypertension: Secondary | ICD-10-CM

## 2013-03-10 LAB — GLUCOSE, CAPILLARY
Glucose-Capillary: 122 mg/dL — ABNORMAL HIGH (ref 70–99)
Glucose-Capillary: 116 mg/dL — ABNORMAL HIGH (ref 70–99)
Glucose-Capillary: 169 mg/dL — ABNORMAL HIGH (ref 70–99)
Glucose-Capillary: 129 mg/dL — ABNORMAL HIGH (ref 70–99)

## 2013-03-10 LAB — URINALYSIS, ROUTINE W REFLEX MICROSCOPIC
Hgb urine dipstick: NEGATIVE
Nitrite: NEGATIVE
Protein, ur: NEGATIVE mg/dL
Specific Gravity, Urine: 1.015 (ref 1.005–1.030)
Urobilinogen, UA: 0.2 mg/dL (ref 0.0–1.0)
pH: 5 (ref 5.0–8.0)

## 2013-03-10 LAB — BASIC METABOLIC PANEL
BUN: 96 mg/dL — ABNORMAL HIGH (ref 6–23)
CO2: 25 mEq/L (ref 19–32)
Chloride: 92 mEq/L — ABNORMAL LOW (ref 96–112)
Creatinine, Ser: 2.5 mg/dL — ABNORMAL HIGH (ref 0.50–1.10)
GFR calc non Af Amer: 18 mL/min — ABNORMAL LOW (ref >90)
Glucose, Bld: 114 mg/dL — ABNORMAL HIGH (ref 70–99)
Potassium: 5 mEq/L (ref 3.5–5.1)
Sodium: 130 mEq/L — ABNORMAL LOW (ref 135–145)

## 2013-03-10 LAB — URINE MICROSCOPIC-ADD ON

## 2013-03-10 LAB — PROTIME-INR
INR: 1.51 — ABNORMAL HIGH (ref 0.00–1.49)
Prothrombin Time: 17.8 seconds — ABNORMAL HIGH (ref 11.6–15.2)

## 2013-03-10 MED ORDER — INSULIN ASPART 100 UNIT/ML ~~LOC~~ SOLN
0.0000 [IU] | Freq: Three times a day (TID) | SUBCUTANEOUS | Status: DC
  Administered 2013-03-10 – 2013-03-12 (×2): 2 [IU] via SUBCUTANEOUS
  Administered 2013-03-12: 17:00:00 3 [IU] via SUBCUTANEOUS

## 2013-03-10 MED ORDER — LEVOFLOXACIN 500 MG PO TABS
500.0000 mg | ORAL_TABLET | ORAL | Status: DC
  Administered 2013-03-12: 11:00:00 500 mg via ORAL
  Filled 2013-03-10: qty 1

## 2013-03-10 MED ORDER — WARFARIN SODIUM 2.5 MG PO TABS
12.5000 mg | ORAL_TABLET | Freq: Once | ORAL | Status: AC
  Administered 2013-03-10: 18:00:00 12.5 mg via ORAL
  Filled 2013-03-10: qty 1

## 2013-03-10 MED ORDER — PNEUMOCOCCAL VAC POLYVALENT 25 MCG/0.5ML IJ INJ
0.5000 mL | INJECTION | INTRAMUSCULAR | Status: AC
  Administered 2013-03-11: 0.5 mL via INTRAMUSCULAR
  Filled 2013-03-10: qty 0.5

## 2013-03-10 MED ORDER — TORSEMIDE 20 MG PO TABS
20.0000 mg | ORAL_TABLET | Freq: Every day | ORAL | Status: DC
  Administered 2013-03-11: 20 mg via ORAL
  Filled 2013-03-10: qty 1

## 2013-03-10 MED ORDER — LEVOFLOXACIN 750 MG PO TABS
750.0000 mg | ORAL_TABLET | Freq: Once | ORAL | Status: AC
  Administered 2013-03-10: 750 mg via ORAL
  Filled 2013-03-10: qty 1

## 2013-03-10 NOTE — Progress Notes (Signed)
[name], female   DOB: [birth year], 72 y.o.   MRN: [id number] Subjective:  Still some cough but dyspnea improved. C/o fever last night to 101.  Objective:  Vital Signs in the last 24 hours: Temp:  [97.5 F (36.4 C)-100.8 F (38.2 C)] 99.6 F (37.6 C) (10/28 0629) Pulse Rate:  [59-76] 59 (10/28 0629) Resp:  [18-22] 21 (10/28 0629) BP: (118-154)/(37-62) 129/37 mmHg (10/28 0629) SpO2:  [90 %-98 %] 97 % (10/28 0817) Weight:  [276 lb 14.4 oz (125.6 kg)-279 lb 12.8 oz (126.916 kg)] 279 lb 12.8 oz (126.916 kg) (10/28 0629)  Intake/Output from previous day: 10/27 0701 - 10/28 0700 In: 483 [P.O.:480; I.V.:3] Out: 100 [Urine:100] Intake/Output from this shift:    Physical Exam: Morbidly obese appearing NAD HEENT: Unremarkable Neck:  No JVD, no thyromegally Back:  No CVA tenderness Lungs:  Clear with no wheezes HEART:  Regular rate rhythm, no murmurs, no rubs, no clicks Abd:  soft, obese, positive bowel sounds, no organomegally, no rebound, no guarding Ext:  2 plus pulses, no edema, no cyanosis, no clubbing Skin:  No rashes no nodules Neuro:  CN II through XII intact, motor grossly intact  Lab Results:  Recent Labs  03/08/13 0359  WBC 13.6*  HGB 10.5*  PLT 186    Recent Labs  03/09/13 1057 03/10/13 0557  NA 130* 130*  K 4.9 5.0  CL 90* 92*  CO2 28 25  GLUCOSE 245* 114*  BUN 91* 96*  CREATININE 2.58* 2.50*   No results found for this basename: TROPONINI, CK, MB,  in the last 72 hours Hepatic Function Panel No results found for this basename: PROT, ALBUMIN, AST, ALT, ALKPHOS, BILITOT, BILIDIR, IBILI,  in the last 72 hours No results found for this basename: CHOL,  in the last 72 hours No results found for this basename: PROTIME,  in the last 72 hours  Imaging: No results found.  Cardiac Studies: Tele - nsr with ventricular pacing  Assessment/Plan:  1. Pneumonia with persistent fever off of anti-biotics 2. Acute on chronic systolic/diastolic  CHF 3.ICD in situ Rec: patient's family is concerned about her still having fever and not being on anti-biotics. I will defer to her primary team. She appears euvolemic though obesity makes this difficult to assess. No change in medical therapy for CHF at this time. Will sign off. Call for questions/problems.  LOS: 5 days    Gregg Taylor,M.D. 03/10/2013, 8:47 AM

## 2013-03-10 NOTE — Progress Notes (Signed)
Md aware of low sodium levels. Orders given to continue to monitor sodium level and continue to monitor patient. Patient complains of no discomfort at this time, will continue to monitor to end of shift.

## 2013-03-10 NOTE — Progress Notes (Signed)
Patient sitting upright in chair. VSS stable and is afebrile. Currently patient complains of no pain or discomfort. Patient is just concerned and needs assistance with getting around outside of home with her cylinder oxygen tank. Patient states it is very cumbersome for patient to get around such as doctor appointments and such and trying to maneuver a large oxygen tank. Is requesting a portable oxygen tank. Left note for Case Management to assist patient with trying to get a portable oxygen tank. Will continue to monitor.

## 2013-03-10 NOTE — Evaluation (Signed)
Physical Therapy Evaluation Patient Details Name: Destiny [name] MRN: [id number] DOB: 1940/10/19 Today's Date: 03/10/2013 Time: 4098-1191 PT Time Calculation (min): 18 min  PT Assessment / Plan / Recommendation History of Present Illness  72 yo female with h/o chf, s/p cabg, ckd, aicd, on home oxygen 2 L [place] cont (this has increased over the last month, was prev only prn during day) comes in with several days of worsening sob and cough.  Clinical Impression  Pt with decreased activity tolerance and desaturation with activity in to mid 80s on 4L02 via [place], however does recover quickly. Pt with good home set up and support. Pt to benefit from HHPT and use of RW upon d/c from hospital.    PT Assessment  Patient needs continued PT services    Follow Up Recommendations  Home health PT;Supervision - Intermittent    Does the patient have the potential to tolerate intense rehabilitation      Barriers to Discharge        Equipment Recommendations  Rolling walker with 5" wheels    Recommendations for Other Services     Frequency Min 3X/week    Precautions / Restrictions Precautions Precautions: Fall Precaution Comments: 4Lo2 via [place] Restrictions Weight Bearing Restrictions: No   Pertinent Vitals/Pain Denies pain      Mobility  Bed Mobility Bed Mobility: Not assessed (pt up in chair) Transfers Transfers: Sit to Stand;Stand to Sit Sit to Stand: 4: Min assist;With upper extremity assist;From chair/3-in-1 Stand to Sit: 5: Supervision;With upper extremity assist;To chair/3-in-1 Details for Transfer Assistance: increased time Ambulation/Gait Ambulation/Gait Assistance: 4: Min guard Ambulation Distance (Feet): 10 Feet (x2, to/from bathroom) Assistive device: Rolling walker;None Ambulation/Gait Assistance Details: pt amb to bathroom without RW however pt unsteady and held onto counter. pt with increased stability with RW on way back to chair from bathroom Gait Pattern: Step-through  pattern;Decreased stride length;Wide base of support Gait velocity: slow General Gait Details: +sob, SpO2 at 84% on 4LO2 at end of amb back from bathroom. Pt returned to 100% in 2 min Stairs: No    Exercises     PT Diagnosis: Generalized weakness  PT Problem List: Decreased strength;Decreased activity tolerance;Decreased knowledge of use of DME PT Treatment Interventions: DME instruction;Gait training;Functional mobility training;Therapeutic activities;Therapeutic exercise     PT Goals(Current goals can be found in the care plan section) Acute Rehab PT Goals Patient Stated Goal: home PT Goal Formulation: With patient Time For Goal Achievement: 03/17/13 Potential to Achieve Goals: Good  Visit Information  Last PT Received On: 03/10/13 Assistance Needed: +1 History of Present Illness: 72 yo female with h/o chf, s/p cabg, ckd, aicd, on home oxygen 2 L [place] cont (this has increased over the last month, was prev only prn during day) comes in with several days of worsening sob and cough.       Prior Functioning  Home Living Family/patient expects to be discharged to:: Private residence Living Arrangements: Spouse/significant other Available Help at Discharge: Family;Available 24 hours/day Type of Home: House Home Access: Ramped entrance Home Layout: One level Home Equipment: Walker - 4 wheels (tall toliets) Prior Function Level of Independence: Independent Communication Communication: No difficulties Dominant Hand: Right    Cognition  Cognition Arousal/Alertness: Awake/alert Behavior During Therapy: WFL for tasks assessed/performed Overall Cognitive Status: Within Functional Limits for tasks assessed    Extremity/Trunk Assessment Upper Extremity Assessment Upper Extremity Assessment: Generalized weakness Lower Extremity Assessment Lower Extremity Assessment: Generalized weakness Cervical / Trunk Assessment Cervical / Trunk Assessment: Normal  Balance    End of Session  PT - End of Session Equipment Utilized During Treatment: Gait belt;Oxygen (4Lo2 via ) Activity Tolerance:  (limited by SOB) Patient left: in chair;with call bell/phone within reach Nurse Communication: Mobility status (need for portable O2 tank for home)  GP     Marcene Brawn 03/10/2013, 4:36 PM  Lewis Shock, PT, DPT Pager #: [phone] Office #: [phone]

## 2013-03-10 NOTE — Progress Notes (Signed)
CONSULT NOTE - Follow Up Consult  Pharmacy Consult for Coumadin, Levofloxacin Indication: atrial fibrillation and presumed UTI  Allergies  Allergen Reactions  . Sulfonamide Derivatives Other (See Comments)    Unknown; "childhood allergy/mother"    Patient Measurements: Height: 5\' 4"  (162.6 cm) Weight: 279 lb 12.8 oz (126.916 kg) IBW/kg (Calculated) : 54.7 Heparin Dosing Weight:   Vital Signs: Temp: 99.6 F (37.6 C) (10/28 0629) Temp src: Oral (10/28 0629) BP: 129/37 mmHg (10/28 0629) Pulse Rate: 59 (10/28 0629)  Labs:  Recent Labs  03/08/13 0359 03/09/13 0440 03/09/13 1057 03/10/13 0557  HGB 10.5*  --   --   --   HCT 32.0*  --   --   --   PLT 186  --   --   --   LABPROT 17.7* 19.1*  --  17.8*  INR 1.50* 1.66*  --  1.51*  CREATININE 2.36*  --  2.58* 2.50*    Estimated Creatinine Clearance: 27.6 ml/min (by C-G formula based on Cr of 2.5).   Medications:  Scheduled:  . albuterol  2.5 mg Nebulization Q6H  . allopurinol  100 mg Oral Daily  . antiseptic oral rinse  15 mL Mouth Rinse BID  . aspirin EC  81 mg Oral Daily  . budesonide (PULMICORT) nebulizer solution  0.25 mg Nebulization BID  . carvedilol  12.5 mg Oral BID WC  . citalopram  20 mg Oral Daily  . dextromethorphan  15 mg Oral BID  . gabapentin  300 mg Oral BID  . guaiFENesin  600 mg Oral BID  . insulin aspart  15 Units Subcutaneous Q supper  . insulin aspart  30 Units Subcutaneous Q breakfast  . insulin NPH  30 Units Subcutaneous Q supper  . insulin NPH  45 Units Subcutaneous QAC breakfast  . levothyroxine  25 mcg Oral QAC breakfast  . LORazepam  0.5 mg Oral QHS  . polyethylene glycol  17 g Oral Daily  . sodium chloride  3 mL Intravenous Q12H  . Warfarin - Pharmacist Dosing Inpatient   Does not apply q1800    Assessment: 72yo female with AFib.  INR 1.51 this AM- slightly down, all doses have been charted and have been increasing doses.  No bleeding problems noted.  To add Levofloxacin today for  presumed UTI as pt has been experiencing periodic fevers and has a h/o recurrent UTIs.  Estimated CrCl ~32ml/min, dosing adjustment required.  This may increase Coumadin effect, as well.  Goal of Therapy:  INR 2-3 resolution of fevers, UTI Monitor platelets by anticoagulation protocol: Yes   Plan:  1.  Repeat Coumadin 12.5mg  2.  Levofloxacin 750mg  PO x 1, then 500mg  PO q48 3.  F/U urine culture and fever curve; watch for s/s of bleeding  Marisue Humble, PharmD Clinical Pharmacist [facility]

## 2013-03-10 NOTE — Progress Notes (Signed)
The patient's O2 sats fell to the low 80s when she was checked in the middle of the night.  She had her CPAP on at 6L of O2 at this time.  When her O2 was bumped up to 10L, her O2 sats went back up into the mid 90s.  Upon the recheck an hour later, her O2 sats were still in the mid 90s.

## 2013-03-10 NOTE — Progress Notes (Signed)
[facility] Progress Note  Destiny [name] ZOX:[id number] DOB: 27-Aug-1940 DOA: 03/05/2013 PCP: Danise Edge, MD  Brief narrative: 72 yo female with h/o CHF, s/p cabg, ckd, aicd, on home oxygen 2 L [place]  (this she has increased over the past 2 weeks), was prev only prn during HS with CPAP. Presented with several days of worsening SOB and cough. No CP. No n/v. No LE edema. No cough. No h/o copd. Arrived at urgent care and was found to be hypoxic with sats on RA 75%. Started on oxygen and given lasix 80mg  IV and NTG gtt then transferred to telemetry on 03/10/13. Still spiking fever, renal function improving and breathing is better as well.    Assessment/Plan:  Acute respiratory failure with hypoxia due to:   A) Acute diastolic heart failure   B) History of Cardiomyopathy, ischemic   C) likely OHS   D) OSA -sx's have markedly improved/stable on [place] oxygen -ECHO last year with preserved LV fnx likely due to med rxn but repeat ECHO revealed systolic CHF with EF 35-40% with Grade 2 Diastolic dysf - will resume lasix low dose on 10/29 -Close I's and O's -low sodium diet -continue CPAP  ARF on CKD (chronic kidney disease) stage 3, GFR 30-59 ml/min - due to diuretics -  -improving -will resume low dose lasix in am -follow BMET    OSA (obstructive sleep apnea) -husband brought  in home CPAP machine -continue use QHS.    DIABETES MELLITUS, WITH AUTONOMIC NEUROPATHY - home doses of insulin clarified (pt mixes her regular w/ her NPH) - CBG uncontrolled - adjust tx, use SSI and follow  - A1c elevated at 7.3    HYPERTENSION -BP has been occasionally soft so cautious use of anti HTN meds -will monitor VS and adjust meds as needed    ATRIAL FIBRILLATION, PAROXYSMAL -rate controlled -rationale for Warfarin (also h/o mural thrombus) -continue coumadin per pharmacy   Hypothyroidism -cont Synthroid  Cellulitis of right leg and presumed UTI - follow on Levqauin (exam much improved  today, now only suggestive of leukocytoclastic vasculitis) -UA and culture has been sent, follow results    ICD - IN SITU -rate controlled. -per cardiology no further intervention or recommendations at this point  DVT prophylaxis: Warfarin Code Status: Full Family Communication: Patient only, no family at bedside Disposition Plan/Expected LOS: home when medically stable.  Consultants: Cardiology   Procedures: 2-D echocardiogram  - Left ventricle: The cavity size was at the upper limits of normal. Wall thickness was increased in a pattern of mild LVH. Systolic function was moderately reduced. The estimated ejection fraction was in the range of 35% to 40%. There is moderate hypokinesis of the mid-distalanteroseptal myocardium. Features are consistent with a pseudonormal left ventricular filling pattern, with concomitant abnormal relaxation and increased filling pressure (grade 2 diastolic dysfunction). - Mitral valve: Calcified annulus. - Left atrium: The atrium was mildly dilated. - Right atrium: The atrium was mildly dilated.  Antibiotics: Levaquin 10/28 >>>  HPI/Subjective: Patient denies cp, abd pain, nausea or vomiting. Feel warm overnight and had low grade fever. Reports breathing has improved.    Objective: Blood pressure 120/59, pulse 64, temperature 98 F (36.7 C), temperature source Oral, resp. rate 22, height 5\' 4"  (1.626 m), weight 126.916 kg (279 lb 12.8 oz), SpO2 99.00%.  Intake/Output Summary (Last 24 hours) at 03/10/13 1402 Last data filed at 03/10/13 1300  Gross per 24 hour  Intake    963 ml  Output    600  ml  Net    363 ml   Exam: General: No acute respiratory distress; low grade fever Lungs: distant bs th/o all fields - no focal crackles - no wheeze  Cardiovascular: Regular rate, without murmur, gallop or rub  Abdomen: Obese, Nontender, nondistended, soft, bowel sounds positive, no rebound, no ascites, no appreciable mass Musculoskeletal: No  significant cyanosis or clubbing of bilateral lower extremities; trace edema bilaterally. Patient with some mild erythema of right ant leg (vasculitic in appearance, no warm sensation and no pain)   Scheduled Meds:  Scheduled Meds: . albuterol  2.5 mg Nebulization Q6H  . allopurinol  100 mg Oral Daily  . antiseptic oral rinse  15 mL Mouth Rinse BID  . aspirin EC  81 mg Oral Daily  . budesonide (PULMICORT) nebulizer solution  0.25 mg Nebulization BID  . carvedilol  12.5 mg Oral BID WC  . citalopram  20 mg Oral Daily  . dextromethorphan  15 mg Oral BID  . gabapentin  300 mg Oral BID  . guaiFENesin  600 mg Oral BID  . insulin aspart  15 Units Subcutaneous Q supper  . insulin aspart  30 Units Subcutaneous Q breakfast  . insulin NPH  30 Units Subcutaneous Q supper  . insulin NPH  45 Units Subcutaneous QAC breakfast  . [START ON 03/12/2013] levofloxacin  500 mg Oral Q48H  . levothyroxine  25 mcg Oral QAC breakfast  . LORazepam  0.5 mg Oral QHS  . [START ON 03/11/2013] pneumococcal 23 valent vaccine  0.5 mL Intramuscular Tomorrow-1000  . polyethylene glycol  17 g Oral Daily  . sodium chloride  3 mL Intravenous Q12H  . warfarin  12.5 mg Oral ONCE-1800  . Warfarin - Pharmacist Dosing Inpatient   Does not apply q1800   Data Reviewed: Basic Metabolic Panel:  Recent Labs Lab 03/06/13 0740 03/07/13 0525 03/08/13 0359 03/09/13 1057 03/10/13 0557  NA 136 133* 131* 130* 130*  K 4.9 4.8 4.8 4.9 5.0  CL 95* 93* 93* 90* 92*  CO2 30 29 28 28 25   GLUCOSE 194* 141* 121* 245* 114*  BUN 42* 55* 72* 91* 96*  CREATININE 1.57* 1.96* 2.36* 2.58* 2.50*  CALCIUM 9.3 8.9 8.5 8.4 8.0*   CBC:  Recent Labs Lab 03/05/13 1608 03/07/13 0525 03/08/13 0359  WBC 15.2* 14.1* 13.6*  HGB 11.8* 11.2* 10.5*  HCT 37.3 34.0* 32.0*  MCV 95.4 93.7 93.3  PLT 183 195 186   Cardiac Enzymes:  Recent Labs Lab 03/05/13 1600 03/05/13 2200 03/06/13 0630 03/06/13 0920  TROPONINI <0.30 <0.30 <0.30 <0.30    BNP (last 3 results)  Recent Labs  03/05/13 1608  PROBNP 3671.0*   CBG:  Recent Labs Lab 03/09/13 1243 03/09/13 1600 03/09/13 2039 03/10/13 0627 03/10/13 1110  GLUCAP 312* 270* 167* 122* 116*    Recent Results (from the past 240 hour(s))  MRSA PCR SCREENING     Status: None   Collection Time    03/05/13  8:35 PM      Result Value Range Status   MRSA by PCR NEGATIVE  NEGATIVE Final   Comment:            The GeneXpert MRSA Assay (FDA     approved for NASAL specimens     only), is one component of a     comprehensive MRSA colonization     surveillance program. It is not     intended to diagnose MRSA     infection nor to guide or  monitor treatment for     MRSA infections.     Delorse Shane,  [phone] 03/10/2013, 2:02 PM

## 2013-03-11 LAB — GLUCOSE, CAPILLARY
Glucose-Capillary: 109 mg/dL — ABNORMAL HIGH (ref 70–99)
Glucose-Capillary: 133 mg/dL — ABNORMAL HIGH (ref 70–99)

## 2013-03-11 LAB — BASIC METABOLIC PANEL
BUN: 99 mg/dL — ABNORMAL HIGH (ref 6–23)
CO2: 27 mEq/L (ref 19–32)
Calcium: 8.4 mg/dL (ref 8.4–10.5)
Glucose, Bld: 99 mg/dL (ref 70–99)
Potassium: 5.1 mEq/L (ref 3.5–5.1)
Sodium: 130 mEq/L — ABNORMAL LOW (ref 135–145)

## 2013-03-11 LAB — URINE CULTURE: Colony Count: 2000

## 2013-03-11 MED ORDER — WARFARIN SODIUM 7.5 MG PO TABS
7.5000 mg | ORAL_TABLET | Freq: Once | ORAL | Status: AC
  Administered 2013-03-11: 7.5 mg via ORAL
  Filled 2013-03-11: qty 1

## 2013-03-11 MED ORDER — FUROSEMIDE 10 MG/ML IJ SOLN: 40.0000 mg | Freq: Two times a day (BID) | INTRAMUSCULAR | Status: DC

## 2013-03-11 MED ORDER — ALBUTEROL SULFATE (5 MG/ML) 0.5% IN NEBU
2.5000 mg | INHALATION_SOLUTION | Freq: Three times a day (TID) | RESPIRATORY_TRACT | Status: DC
  Administered 2013-03-11 – 2013-03-13 (×7): 2.5 mg via RESPIRATORY_TRACT
  Filled 2013-03-11 (×6): qty 0.5

## 2013-03-11 MED ORDER — ALBUTEROL SULFATE (5 MG/ML) 0.5% IN NEBU: 2.5000 mg | INHALATION_SOLUTION | RESPIRATORY_TRACT | Status: DC | PRN

## 2013-03-11 NOTE — Progress Notes (Signed)
[facility] PROGRESS NOTE Interim History: 72 yo female with h/o CHF, s/p cabg, ckd, aicd, on home oxygen 2 L [place] (this she has increased over the past 2 weeks), was prev only prn during HS with CPAP. Presented with several days of worsening SOB and cough. No CP. No n/v. No LE edema. No cough. No h/o copd. Arrived at urgent care and was found to be hypoxic with sats on RA 75%. Started on oxygen and given lasix 80mg  IV and NTG gtt then transferred to telemetry   [facility] Weights   03/09/13 1220 03/10/13 0629 03/11/13 0454  Weight: 125.6 kg (276 lb 14.4 oz) 126.916 kg (279 lb 12.8 oz) 126.6 kg (279 lb 1.6 oz)        Intake/Output Summary (Last 24 hours) at 03/11/13 1259 Last data filed at 03/11/13 0500  Gross per 24 hour  Intake    240 ml  Output   1150 ml  Net   -910 ml     Assessment/Plan:  Acute respiratory failure with hypoxia due to Acute systolic and diastolic heart failure, likely OHS and OSA and PNA: -sx's have markedly improved/stable on [place] oxygen.  -ECHO last year with preserved LV fnx likely due to med rxn but repeat, ECHO revealed systolic CHF with EF 35-40% with Grade 2 Diastolic dysf  - cardiology managing diuretics - dc'd 10/27 due to worsening renal function. Have stopped torsemide on 10.29.2014. - b-met showed improvement in renal function. - cont Levaquin 9 restarted on 10.28  ARF on CKD (chronic kidney disease) stage 3, GFR 30-59 ml/min  - Lasix stopped 10/27- repeat BMET 10/28 demonstrated worsened renal function (91/2.58)  - one time bolus 10.28.2014 showed improved Cr - check BMET in am. - baseline Cr 1.6-1.8.  OSA (obstructive sleep apnea)  -husband brought in home CPAP machine   DIABETES MELLITUS, WITH AUTONOMIC NEUROPATHY  - home doses pt mixes her regular w/ her NPH.  - CBG with improved controlled.- adjust tx and follow  - A1c elevated at 7.3   HYPERTENSION  -BP has been occasionally soft so cautious use of anti HTN meds   ATRIAL FIBRILLATION,  PAROXYSMAL  -rate controlled  -rationale for Warfarin (also h/o mural thrombus)   Hypothyroidism  -cont usual Synthroid   Cellulitis of right leg  - follow on Levqauin - exam much improved today, now only suggestive of leukocytoclastic vasculitis     Code Status: Full  Family Communication: Patient only, no family at bedside  Disposition Plan/Expected LOS: home when medically stable.    Consultants:  cardiology  Procedures: ECHO: EF 35-40% with Grade 2 Diastolic dysf   Antibiotics:  levaquin  HPI/Subjective: No complains, she relates she feels better SOB improved and no further malaise.  Objective: Filed Vitals:   03/10/13 2023 03/10/13 2044 03/11/13 0454 03/11/13 0855  BP: 141/49  139/35 126/95  Pulse: 69 68 66 63  Temp: 98.4 F (36.9 C)  98.7 F (37.1 C) 98.8 F (37.1 C)  TempSrc: Oral  Oral Oral  Resp: 19 20 19 24   Height:      Weight:   126.6 kg (279 lb 1.6 oz)   SpO2: 98% 97% 94% 92%     Exam:  General: Alert, awake, oriented x3, in no acute distress.  HEENT: No bruits, no goiter.  Heart: Regular rate and rhythm, without murmurs, rubs, gallops.  Lungs: Good air movement, bilateral air movement.  Abdomen: Soft, nontender, nondistended, positive bowel sounds.     Data Reviewed: Basic Metabolic  Panel:  Recent Labs Lab 03/07/13 0525 03/08/13 0359 03/09/13 1057 03/10/13 0557 03/11/13 0615  NA 133* 131* 130* 130* 130*  K 4.8 4.8 4.9 5.0 5.1  CL 93* 93* 90* 92* 94*  CO2 29 28 28 25 27   GLUCOSE 141* 121* 245* 114* 99  BUN 55* 72* 91* 96* 99*  CREATININE 1.96* 2.36* 2.58* 2.50* 2.15*  CALCIUM 8.9 8.5 8.4 8.0* 8.4   Liver Function Tests: No results found for this basename: AST, ALT, ALKPHOS, BILITOT, PROT, ALBUMIN,  in the last 168 hours No results found for this basename: LIPASE, AMYLASE,  in the last 168 hours No results found for this basename: AMMONIA,  in the last 168 hours CBC:  Recent Labs Lab 03/05/13 1608 03/07/13 0525  03/08/13 0359  WBC 15.2* 14.1* 13.6*  HGB 11.8* 11.2* 10.5*  HCT 37.3 34.0* 32.0*  MCV 95.4 93.7 93.3  PLT 183 195 186   Cardiac Enzymes:  Recent Labs Lab 03/05/13 1600 03/05/13 2200 03/06/13 0630 03/06/13 0920  TROPONINI <0.30 <0.30 <0.30 <0.30   BNP (last 3 results)  Recent Labs  03/05/13 1608  PROBNP 3671.0*   CBG:  Recent Labs Lab 03/10/13 0627 03/10/13 1110 03/10/13 1559 03/10/13 2120 03/11/13 0606  GLUCAP 122* 116* 169* 129* 100*    Recent Results (from the past 240 hour(s))  MRSA PCR SCREENING     Status: None   Collection Time    03/05/13  8:35 PM      Result Value Range Status   MRSA by PCR NEGATIVE  NEGATIVE Final   Comment:            The GeneXpert MRSA Assay (FDA     approved for NASAL specimens     only), is one component of a     comprehensive MRSA colonization     surveillance program. It is not     intended to diagnose MRSA     infection nor to guide or     monitor treatment for     MRSA infections.     Studies: No results found.  Scheduled Meds: . albuterol  2.5 mg Nebulization TID  . allopurinol  100 mg Oral Daily  . antiseptic oral rinse  15 mL Mouth Rinse BID  . aspirin EC  81 mg Oral Daily  . budesonide (PULMICORT) nebulizer solution  0.25 mg Nebulization BID  . carvedilol  12.5 mg Oral BID WC  . citalopram  20 mg Oral Daily  . dextromethorphan  15 mg Oral BID  . gabapentin  300 mg Oral BID  . guaiFENesin  600 mg Oral BID  . insulin aspart  0-9 Units Subcutaneous TID WC  . insulin aspart  15 Units Subcutaneous Q supper  . insulin aspart  30 Units Subcutaneous Q breakfast  . insulin NPH  30 Units Subcutaneous Q supper  . insulin NPH  45 Units Subcutaneous QAC breakfast  . [START ON 03/12/2013] levofloxacin  500 mg Oral Q48H  . levothyroxine  25 mcg Oral QAC breakfast  . LORazepam  0.5 mg Oral QHS  . polyethylene glycol  17 g Oral Daily  . sodium chloride  3 mL Intravenous Q12H  . torsemide  20 mg Oral Daily  .  warfarin  7.5 mg Oral ONCE-1800  . Warfarin - Pharmacist Dosing Inpatient   Does not apply q1800   Continuous Infusions:    Marinda Elk  [facility] Pager [phone]. If 8PM-8AM, please contact night-coverage at [email or web address], password [facility] 03/11/2013,  12:59 PM  LOS: 6 days

## 2013-03-11 NOTE — Progress Notes (Signed)
CONSULT NOTE - Follow Up Consult  Pharmacy Consult for Coumadin, Levofloxacin Indication: atrial fibrillation and presumed UTI  Allergies  Allergen Reactions  . Sulfonamide Derivatives Other (See Comments)    Unknown; "childhood allergy/mother"    Patient Measurements: Height: 5\' 4"  (162.6 cm) Weight: 279 lb 1.6 oz (126.6 kg) (scale C) IBW/kg (Calculated) : 54.7    Vital Signs: Temp: 98.8 F (37.1 C) (10/29 0855) Temp src: Oral (10/29 0855) BP: 126/95 mmHg (10/29 0855) Pulse Rate: 63 (10/29 0855)  Labs:  Recent Labs  03/09/13 0440 03/09/13 1057 03/10/13 0557 03/11/13 0615  LABPROT 19.1*  --  17.8* 21.6*  INR 1.66*  --  1.51* 1.94*  CREATININE  --  2.58* 2.50* 2.15*    Estimated Creatinine Clearance: 32.1 ml/min (by C-G formula based on Cr of 2.15).   Medications:  Scheduled:  . albuterol  2.5 mg Nebulization TID  . allopurinol  100 mg Oral Daily  . antiseptic oral rinse  15 mL Mouth Rinse BID  . aspirin EC  81 mg Oral Daily  . budesonide (PULMICORT) nebulizer solution  0.25 mg Nebulization BID  . carvedilol  12.5 mg Oral BID WC  . citalopram  20 mg Oral Daily  . dextromethorphan  15 mg Oral BID  . gabapentin  300 mg Oral BID  . guaiFENesin  600 mg Oral BID  . insulin aspart  0-9 Units Subcutaneous TID WC  . insulin aspart  15 Units Subcutaneous Q supper  . insulin aspart  30 Units Subcutaneous Q breakfast  . insulin NPH  30 Units Subcutaneous Q supper  . insulin NPH  45 Units Subcutaneous QAC breakfast  . [START ON 03/12/2013] levofloxacin  500 mg Oral Q48H  . levothyroxine  25 mcg Oral QAC breakfast  . LORazepam  0.5 mg Oral QHS  . pneumococcal 23 valent vaccine  0.5 mL Intramuscular Tomorrow-1000  . polyethylene glycol  17 g Oral Daily  . sodium chloride  3 mL Intravenous Q12H  . torsemide  20 mg Oral Daily  . Warfarin - Pharmacist Dosing Inpatient   Does not apply q1800    Assessment: 72yo female with AFib. Also has h/o mural thrombus.  INR is  1.94 today. Increasing toward goal.  No bleeding problems noted.  PTA coumadin dose was 7.5 qTTHSS and  5mg  qMWF. Admission INR was subtherapeutic at 1.70 with last dose taken 10/22, one day prior to admit.  Inpatient he has received higher doses of 10mg , 10mg , 12.5 mg and 12.5mg  over past 4 days and INR finally increased today to 1.94.   Levofloxacin started yesterday 10/28/1 for cellulitis or right leg and presumed UTI. This may increase Coumadin effect. Will lower coumadin dose today.   Goal of Therapy:  INR 2-3 resolution of fevers, UTI Monitor platelets by anticoagulation protocol: Yes   Plan:  Coumadin 7.5 mg Daily PT/INR. Watch for s/s of bleeding.  Noah Delaine, RPh Clinical Pharmacist Pager: [phone] 03/11/2013 11:10AM

## 2013-03-11 NOTE — Evaluation (Signed)
Occupational Therapy Evaluation Patient Details Name: Destiny [name] MRN: [id number] DOB: 12-31-1940 Today's Date: 03/11/2013 Time: 9562-1308 OT Time Calculation (min): 27 min  OT Assessment / Plan / Recommendation History of present illness 72 yo female with h/o chf, s/p cabg, ckd, aicd, on home oxygen 2 L [place] cont (this has increased over the last month, was prev only prn during day) comes in with several days of worsening sob and cough.   Clinical Impression   Pt demos decline in function and safety with ADLs and ADL mobility and would benefit from acute OT services to address impairments to help restore PLOF to return home safely. Pt's husband very supportive and available 24/7 to assist her prn at home.     OT Assessment  Patient needs continued OT Services    Follow Up Recommendations  Supervision - Intermittent;No OT follow up    Barriers to Discharge   None  Equipment Recommendations  None recommended by OT    Recommendations for Other Services    Frequency  Min 2X/week    Precautions / Restrictions Precautions Precautions: Fall Precaution Comments: 4Lo2 via  Restrictions Weight Bearing Restrictions: No   Pertinent Vitals/Pain No c/o pain, O2 SATs >90% during and after toileting     ADL  Grooming: Performed;Wash/dry hands;Wash/dry face;Min guard Where Assessed - Grooming: Supported standing Upper Body Bathing: Simulated;Supervision/safety;Set up Lower Body Bathing: Simulated;Moderate assistance Upper Body Dressing: Performed;Supervision/safety;Set up Lower Body Dressing: Performed;Maximal assistance Toilet Transfer: Performed;Minimal assistance Toilet Transfer Method: Sit to stand Toilet Transfer Equipment: Bedside commode Toileting - Clothing Manipulation and Hygiene: Performed;Moderate assistance;Other (comment) (total A with hygiene) Where Assessed - Toileting Clothing Manipulation and Hygiene: Standing Tub/Shower Transfer Method: Not  assessed Transfers/Ambulation Related to ADLs: slower pace, increased time ADL Comments: Pt's spouse assists her with socks and shoes at home    OT Diagnosis: Generalized weakness  OT Problem List: Decreased strength;Decreased activity tolerance;Impaired balance (sitting and/or standing);Cardiopulmonary status limiting activity;Obesity OT Treatment Interventions: Self-care/ADL training;Therapeutic exercise;Patient/family education;Neuromuscular education;Balance training;Therapeutic activities;DME and/or AE instruction   OT Goals(Current goals can be found in the care plan section) Acute Rehab OT Goals Patient Stated Goal: home OT Goal Formulation: With patient/family Time For Goal Achievement: 03/18/13 Potential to Achieve Goals: Good ADL Goals Pt Will Perform Grooming: with set-up;with supervision;standing Pt Will Perform Lower Body Bathing: with min assist;with caregiver independent in assisting;sit to/from stand Pt Will Perform Lower Body Dressing: with mod assist;with caregiver independent in assisting;sitting/lateral leans;sit to/from stand Pt Will Transfer to Toilet: with min guard assist;with supervision;bedside commode;grab bars;ambulating;regular height toilet Pt Will Perform Toileting - Clothing Manipulation and hygiene: with min assist;with caregiver independent in assisting;sit to/from stand  Visit Information  Last OT Received On: 03/11/13 Assistance Needed: +1 History of Present Illness: 72 yo female with h/o chf, s/p cabg, ckd, aicd, on home oxygen 2 L  cont (this has increased over the last month, was prev only prn during day) comes in with several days of worsening sob and cough.       Prior Functioning     Home Living Family/patient expects to be discharged to:: Private residence Living Arrangements: Spouse/significant other Available Help at Discharge: Family;Available 24 hours/day Type of Home: House Home Access: Ramped entrance Home Layout: One level Home  Equipment: Walker - 4 wheels Prior Function Level of Independence: Independent Communication Communication: No difficulties Dominant Hand: Right         Vision/Perception Vision - History Baseline Vision: Wears glasses only for reading Patient Visual Report: No  change from baseline Perception Perception: Within Functional Limits   Cognition  Cognition Arousal/Alertness: Awake/alert Behavior During Therapy: WFL for tasks assessed/performed Overall Cognitive Status: Within Functional Limits for tasks assessed    Extremity/Trunk Assessment Upper Extremity Assessment Upper Extremity Assessment: Overall WFL for tasks assessed;Generalized weakness Lower Extremity Assessment Lower Extremity Assessment: Defer to PT evaluation Cervical / Trunk Assessment Cervical / Trunk Assessment: Normal     Mobility Bed Mobility Bed Mobility: Not assessed Details for Bed Mobility Assistance: pt sitting EOB Transfers Transfers: Sit to Stand;Stand to Sit Sit to Stand: 4: Min assist;With upper extremity assist;From chair/3-in-1;From bed Stand to Sit: 5: Supervision;With upper extremity assist;To chair/3-in-1;To bed Details for Transfer Assistance: increased time     Exercise     Balance Balance Balance Assessed: Yes Dynamic Standing Balance Dynamic Standing - Balance Support: Left upper extremity supported;During functional activity Dynamic Standing - Level of Assistance: 4: Min assist;Other (comment) (min guard A)   End of Session OT - End of Session Equipment Utilized During Treatment: Other (comment) (BSC) Activity Tolerance: Patient tolerated treatment well Patient left: in chair;with family/visitor present;with call bell/phone within reach  GO     Galen Manila 03/11/2013, 1:08 PM

## 2013-03-11 NOTE — Care Management Note (Addendum)
    Page 1 of 2   03/17/2013     11:37:27 AM   CARE MANAGEMENT NOTE 03/17/2013  Patient:  Destiny [name], Destiny [name]   Account Number:  [id number]  Date Initiated:  03/06/2013  Documentation initiated by:  MAYO,HENRIETTA  Subjective/Objective Assessment:   adm with dx of resp failure; lives with spouse, has home O2 through [facility] in [place] [phone])     Action/Plan:   Anticipated DC Date:  03/20/2013   Anticipated DC Plan:  LONG TERM ACUTE CARE (LTAC)      DC Planning Services  CM consult      Choice offered to / List presented to:  C-1 Patient   DME arranged  Dan Humphreys      DME agency  [facility]     HH arranged  HH-2 PT      [facility] agency  [facility]   Status of service:  In process, will continue to follow Medicare Important Message given?   (If response is "NO", the following Medicare IM given date fields will be blank) Date Medicare IM given:   Date Additional Medicare IM given:    Discharge Disposition:    Per UR Regulation:  Reviewed for med. necessity/level of care/duration of stay  If discussed at Long Length of Stay Meetings, dates discussed:   03/10/2013    Comments:  Contact: Vineta Carone, spouse  [phone], [phone]  03-17-13  11:30am Avie Arenas, RNBSN - [phone] Sitting up in chair. Remains on vent.  Husband at bedside. patient awake and alert, nodding head appropriately. Husband states patient was indedpendent at home but could not due much due to SOB.  Is on home oxygen - from [facility]. Cannot walk much as she becomes SOB very quickly.  CM will continue to follow for rehab options.  03-13-13 10:25am Avie Arenas, RNBSN [id number] Deteriorated - tx to ICU - failed bipap - intubated.  03/11/13  @ 1445  Lourdes Sledge RN Case Manager Spoke with patient regarding HHPT.  Referral made to [facility].  I spoke with [facility] regarding patients travel O2 tank.  In order to switch to a portable O2  concentrator an order would need to be written for the tank and also a conserving O2 portable tank.  The patient understands she will need to speak to her PCP to obtain this order.

## 2013-03-11 NOTE — Progress Notes (Signed)
I cosign for Destiny [name]'s (SN) assessment, I & O, care plan, and education.

## 2013-03-12 DIAGNOSIS — E875 Hyperkalemia: Secondary | ICD-10-CM

## 2013-03-12 LAB — BASIC METABOLIC PANEL
BUN: 91 mg/dL — ABNORMAL HIGH (ref 6–23)
CO2: 29 mEq/L (ref 19–32)
Calcium: 8.6 mg/dL (ref 8.4–10.5)
Creatinine, Ser: 1.67 mg/dL — ABNORMAL HIGH (ref 0.50–1.10)
GFR calc Af Amer: 35 mL/min — ABNORMAL LOW (ref >90)
GFR calc non Af Amer: 30 mL/min — ABNORMAL LOW (ref >90)
Glucose, Bld: 187 mg/dL — ABNORMAL HIGH (ref 70–99)
Sodium: 132 mEq/L — ABNORMAL LOW (ref 135–145)

## 2013-03-12 LAB — PROTIME-INR
INR: 1.92 — ABNORMAL HIGH (ref 0.00–1.49)
Prothrombin Time: 21.4 seconds — ABNORMAL HIGH (ref 11.6–15.2)

## 2013-03-12 LAB — GLUCOSE, CAPILLARY: Glucose-Capillary: 161 mg/dL — ABNORMAL HIGH (ref 70–99)

## 2013-03-12 MED ORDER — TORSEMIDE 20 MG PO TABS: 20.0000 mg | ORAL_TABLET | Freq: Every day | ORAL | Status: DC

## 2013-03-12 MED ORDER — WARFARIN SODIUM 10 MG PO TABS
10.0000 mg | ORAL_TABLET | Freq: Once | ORAL | Status: AC
  Administered 2013-03-12: 18:00:00 10 mg via ORAL
  Filled 2013-03-12: qty 1

## 2013-03-12 MED ORDER — SODIUM POLYSTYRENE SULFONATE 15 GM/60ML PO SUSP
30.0000 g | Freq: Once | ORAL | Status: AC
  Administered 2013-03-12: 17:00:00 30 g via ORAL
  Filled 2013-03-12: qty 120

## 2013-03-12 MED ORDER — ENALAPRIL MALEATE 10 MG PO TABS: 10.0000 mg | ORAL_TABLET | Freq: Every day | ORAL | Status: DC

## 2013-03-12 MED ORDER — LEVOFLOXACIN 500 MG PO TABS: 500.0000 mg | ORAL_TABLET | ORAL | Status: DC

## 2013-03-12 MED ORDER — TORSEMIDE 20 MG PO TABS: 20.0000 mg | ORAL_TABLET | Freq: Two times a day (BID) | ORAL | Status: DC

## 2013-03-12 NOTE — Progress Notes (Signed)
PT Cancellation Note  Patient Details Name: Destiny [name] MRN: [id number] DOB: May 10, 1941   Cancelled Treatment:    Reason Eval/Treat Not Completed: Fatigue/lethargy limiting ability to participate (pt scheduled for D/C, reports too worn out to try now)   Delorse Lek 03/12/2013, 2:22 PM Delaney Meigs, PT [phone]

## 2013-03-12 NOTE — Progress Notes (Addendum)
[facility] PROGRESS NOTE Interim History: 72 yo female with h/o CHF, s/p cabg, ckd, aicd, on home oxygen 2 L [place] (this she has increased over the past 2 weeks), was prev only prn during HS with CPAP. Presented with several days of worsening SOB and cough. No CP. No n/v. No LE edema. No cough. No h/o copd. Arrived at urgent care and was found to be hypoxic with sats on RA 75%. Started on oxygen and given lasix 80mg  IV and NTG gtt then transferred to telemetry   [facility]   03/10/13 0629 03/11/13 0454 03/12/13 0431  Weight: 126.916 kg (279 lb 12.8 oz) 126.6 kg (279 lb 1.6 oz) 125.828 kg (277 lb 6.4 oz)        Intake/Output Summary (Last 24 hours) at 03/12/13 1221 Last data filed at 03/12/13 0933  Gross per 24 hour  Intake    675 ml  Output    775 ml  Net   -100 ml     Assessment/Plan:  Acute respiratory failure with hypoxia due to Acute systolic and diastolic heart failure, likely OHS and OSA and PNA: -sx's have markedly improved/stable on [place] oxygen.  -ECHO last year with preserved LV fnx likely due to med rxn but repeat, ECHO revealed systolic CHF with EF 35-40% with Grade 2 Diastolic dysf  - cardiology managing diuretics - dc'd 10/27 due to worsening renal function. Have stopped torsemide on 10.29.2014. - b-met pending. - cont Levaquin 9 restarted on 10.28  ARF on CKD (chronic kidney disease) stage 3, GFR 30-59 ml/min  - Lasix stopped 10/27- repeat BMET 10/28 demonstrated worsened renal function (91/2.58)  - one time bolus 10.28.2014 showed improved Cr - - At baseline Cr 1.6-1.8.  Hyperkalemia: - Most likely due to pre-renal. - Kayexalate and fluid orally.  OSA (obstructive sleep apnea)  -husband brought in home CPAP machine   DIABETES MELLITUS, WITH AUTONOMIC NEUROPATHY  - home doses pt mixes her regular w/ her NPH.  - CBG with improved controlled.- adjust tx and follow  - A1c elevated at 7.3   HYPERTENSION  -BP has been occasionally soft so cautious use of  anti HTN meds   ATRIAL FIBRILLATION, PAROXYSMAL  -rate controlled  -rationale for Warfarin (also h/o mural thrombus)   Hypothyroidism  -cont usual Synthroid   Cellulitis of right leg  - follow on Levqauin - exam much improved today, now only suggestive of leukocytoclastic vasculitis     Code Status: Full  Family Communication: Patient only, no family at bedside  Disposition Plan/Expected LOS: home when medically stable.    Consultants:  cardiology  Procedures: ECHO: EF 35-40% with Grade 2 Diastolic dysf   Antibiotics:  levaquin  HPI/Subjective: No complains, wants to go home.  Objective: Filed Vitals:   03/12/13 0431 03/12/13 0559 03/12/13 0842 03/12/13 0915  BP: 175/56 156/57 154/94 154/94  Pulse: 67  66 67  Temp: 98.4 F (36.9 C)   97.3 F (36.3 C)  TempSrc: Oral   Oral  Resp: 18   16  Height:      Weight: 125.828 kg (277 lb 6.4 oz)     SpO2: 98%   96%     Exam:  General: Alert, awake, oriented x3, in no acute distress.  HEENT: No bruits, no goiter.  Heart: Regular rate and rhythm, without murmurs, rubs, gallops.  Lungs: Good air movement, bilateral air movement.  Abdomen: Soft, nontender, nondistended, positive bowel sounds.     Data Reviewed: Basic Metabolic Panel:  Recent  Labs Lab 03/07/13 0525 03/08/13 0359 03/09/13 1057 03/10/13 0557 03/11/13 0615  NA 133* 131* 130* 130* 130*  K 4.8 4.8 4.9 5.0 5.1  CL 93* 93* 90* 92* 94*  CO2 29 28 28 25 27   GLUCOSE 141* 121* 245* 114* 99  BUN 55* 72* 91* 96* 99*  CREATININE 1.96* 2.36* 2.58* 2.50* 2.15*  CALCIUM 8.9 8.5 8.4 8.0* 8.4   Liver Function Tests: No results found for this basename: AST, ALT, ALKPHOS, BILITOT, PROT, ALBUMIN,  in the last 168 hours No results found for this basename: LIPASE, AMYLASE,  in the last 168 hours No results found for this basename: AMMONIA,  in the last 168 hours CBC:  Recent Labs Lab 03/05/13 1608 03/07/13 0525 03/08/13 0359  WBC 15.2* 14.1* 13.6*   HGB 11.8* 11.2* 10.5*  HCT 37.3 34.0* 32.0*  MCV 95.4 93.7 93.3  PLT 183 195 186   Cardiac Enzymes:  Recent Labs Lab 03/05/13 1600 03/05/13 2200 03/06/13 0630 03/06/13 0920  TROPONINI <0.30 <0.30 <0.30 <0.30   BNP (last 3 results)  Recent Labs  03/05/13 1608  PROBNP 3671.0*   CBG:  Recent Labs Lab 03/11/13 1123 03/11/13 1553 03/11/13 2123 03/12/13 0556 03/12/13 1122  GLUCAP 109* 89 133* 113* 161*    Recent Results (from the past 240 hour(s))  MRSA PCR SCREENING     Status: None   Collection Time    03/05/13  8:35 PM      Result Value Range Status   MRSA by PCR NEGATIVE  NEGATIVE Final   Comment:            The GeneXpert MRSA Assay (FDA     approved for NASAL specimens     only), is one component of a     comprehensive MRSA colonization     surveillance program. It is not     intended to diagnose MRSA     infection nor to guide or     monitor treatment for     MRSA infections.  URINE CULTURE     Status: None   Collection Time    03/10/13 12:33 PM      Result Value Range Status   Specimen Description URINE, CLEAN CATCH   Final   Special Requests NONE   Final   Culture  Setup Time     Final   Value: 03/10/2013 17:08     Performed at [facility] Count     Final   Value: 2,000 COLONIES/ML     Performed at [facility]   Culture     Final   Value: INSIGNIFICANT GROWTH     Performed at [facility]   Report Status 03/11/2013 FINAL   Final     Studies: No results found.  Scheduled Meds: . albuterol  2.5 mg Nebulization TID  . allopurinol  100 mg Oral Daily  . antiseptic oral rinse  15 mL Mouth Rinse BID  . aspirin EC  81 mg Oral Daily  . budesonide (PULMICORT) nebulizer solution  0.25 mg Nebulization BID  . carvedilol  12.5 mg Oral BID WC  . citalopram  20 mg Oral Daily  . dextromethorphan  15 mg Oral BID  . gabapentin  300 mg Oral BID  . guaiFENesin  600 mg Oral BID  . insulin aspart  0-9 Units Subcutaneous  TID WC  . insulin aspart  15 Units Subcutaneous Q supper  . insulin aspart  30 Units Subcutaneous  Q breakfast  . insulin NPH  30 Units Subcutaneous Q supper  . insulin NPH  45 Units Subcutaneous QAC breakfast  . levofloxacin  500 mg Oral Q48H  . levothyroxine  25 mcg Oral QAC breakfast  . LORazepam  0.5 mg Oral QHS  . polyethylene glycol  17 g Oral Daily  . sodium chloride  3 mL Intravenous Q12H  . warfarin  10 mg Oral ONCE-1800  . Warfarin - Pharmacist Dosing Inpatient   Does not apply q1800   Continuous Infusions:    Marinda Elk  [facility] Pager [phone]. If 8PM-8AM, please contact night-coverage at [email or web address], password [facility] 03/12/2013, 12:21 PM  LOS: 7 days

## 2013-03-12 NOTE — Progress Notes (Signed)
Patient evaluated for community based chronic disease management services with [facility] Care Management Program as a benefit of patient's [employer]. Spoke with patient and spouse at bedside to explain [facility] Care Management services.  Both have declined services at this time maintaining that they can self manage.  Left contact information and [facility] literature at bedside. Made Inpatient Case Manager aware that [facility] Care Management following. Of note, [facility] Care Management services does not replace or interfere with any services that are arranged by inpatient case management or social work.  For additional questions or referrals please contact Anibal Henderson BSN RN [facility] Liaison at [phone].

## 2013-03-12 NOTE — Progress Notes (Addendum)
Referral received for insulin regimen. Glucose levels are fairly well controlled with present in-hospital regimen. Would not make any further recommendations other than a meal dose before lunch if eating lunch at home. Thank you, Lenor Coffin, RN, CNS, Diabetes Coordinator [phone])

## 2013-03-12 NOTE — Discharge Summary (Signed)
Physician Discharge Summary  Destiny [name] QIO:[id number] DOB: February 02, 1941 DOA: 03/05/2013  PCP: Danise Edge, MD  Admit date: 03/05/2013 Discharge date: 03/12/2013  Time spent: 35 minutes  Recommendations for Outpatient Follow-up:  1. Follow up with cardiologist in 1 week. 2. Check a b-me tin 1 week.  Discharge Diagnoses:  Principal Problem:   Acute on chronic combined systolic and diastolic heart failure Active Problems:   Acute respiratory failure with hypoxia   CAP (community acquired pneumonia)   DIABETES MELLITUS, WITH AUTONOMIC NEUROPATHY   HYPERTENSION   LBBB   ATRIAL FIBRILLATION, PAROXYSMAL   ICD - IN SITU   OSA (obstructive sleep apnea)   History of Cardiomyopathy, ischemic   CKD (chronic kidney disease) stage 3, GFR 30-59 ml/min   Hypothyroidism   Discharge Condition: stable  Diet recommendation: heart healthy low sodium diet  Filed Weights   03/10/13 0629 03/11/13 0454 03/12/13 0431  Weight: 126.916 kg (279 lb 12.8 oz) 126.6 kg (279 lb 1.6 oz) 125.828 kg (277 lb 6.4 oz)    History of present illness:  72 yo female with h/o chf, s/p cabg, ckd, aicd, on home oxygen 2 L [place] cont (this has increased over the last month, was prev only prn during day) comes in with several days of worsening sob and cough. No cp. No n/v. No le edema or swelling. No cough. No h/o copd. Arrived at urgent care and was hypoxic on RA 75%. Started on oxygen and given lasix 80mg  iv and ntg gtt and transferred to [facility]. Now she is feeling better, her breathing is improved   Hospital Course:  Acute respiratory failure with hypoxia due to Acute systolic and diastolic heart failure, likely OHS and OSA and PNA:  - initially admitted and over diuresis, started on empiric antibiotics. - sx's have markedly improved/stable on [place] oxygen.  - ECHO last year with preserved LV fnx likely due to med rxn but repeat, ECHO revealed systolic CHF with EF 35-40% with Grade 2 Diastolic dysf  -  cardiology managing diuretics - dc'd 10/27 due to worsening renal function. Have stopped torsemide on 10.29.2014. Will resume on 10.31.2014 at a lower dose. - will follow up with cardiology as an outpatinet. - cont Levaquin   on 10.28 - 11.5.2014.  ARF on CKD (chronic kidney disease) stage 3, GFR 30-59 ml/min  - Lasix stopped 10/27- repeat BMET 10/28 demonstrated worsened renal function (91/2.58)  - one time bolus 10.28.2014 showed improved Cr. - baseline Cr 1.6-1.8.- - follow up with cardiology as an outpatient with a b-met. - will resume torsemide on 10.31.2014.  OSA (obstructive sleep apnea)  -husband brought in home CPAP machine.  DIABETES MELLITUS, WITH AUTONOMIC NEUROPATHY  - home doses pt mixes her regular w/ her NPH.  - CBG with improved controlled.- adjust tx and follow  - A1c elevated at 7.3   HYPERTENSION  -Initially BP soft, resume medication at home dose except diuretics. - which was resumed at lower dose.  ATRIAL FIBRILLATION, PAROXYSMAL  -rate controlled  -rationale for Warfarin (also h/o mural thrombus) .  Hypothyroidism  -cont usual Synthroid.  Cellulitis of right leg  - follow on Levqauin - exam much improved today, now only suggestive of leukocytoclastic vasculitis.  Procedures: Echo 10.24.2014 CXR 10.25.2014 Consultations:  cardiology  Discharge Exam: Filed Vitals:   03/12/13 0915  BP: 154/94  Pulse: 67  Temp: 97.3 F (36.3 C)  Resp: 16    General: A&O x3 Cardiovascular: RRR Respiratory: good air movement CAT B/L  Discharge Instructions  Discharge Orders   Future Appointments Provider Department Dept Phone   03/25/2013 10:00 AM [facility] [place] Office [phone]   03/27/2013 10:15 AM Bradd Canary, MD [facility] at  [facility] [phone]   04/06/2013 8:15 AM [facility] [place] Office [phone]   07/16/2013 12:30 PM Ladene Artist, MD [facility] [phone]   Future Orders Complete By Expires   Diet - low sodium heart healthy  As directed    Increase activity slowly  As directed        Medication List         albuterol 108 (90 BASE) MCG/ACT inhaler  Commonly known as:  PROVENTIL HFA;VENTOLIN HFA  Inhale 2 puffs into the lungs every 6 (six) hours as needed for wheezing or shortness of breath.     allopurinol 100 MG tablet  Commonly known as:  ZYLOPRIM  Take 100 mg by mouth daily.     amLODipine 5 MG tablet  Commonly known as:  NORVASC  Take 1 tablet (5 mg total) by mouth daily.     aspirin EC 81 MG tablet  Take 81 mg by mouth daily.     calcium-vitamin D 500-200 MG-UNIT per tablet  Commonly known as:  OSCAL WITH D  Take 1 tablet by mouth daily with breakfast.     carvedilol 25 MG tablet  Commonly known as:  COREG  Take 50 mg by mouth 2 (two) times daily with a meal.     citalopram 20 MG tablet  Commonly known as:  CELEXA  Take 1 tablet (20 mg total) by mouth daily.     enalapril 10 MG tablet  Commonly known as:  VASOTEC  Take 1 tablet (10 mg total) by mouth daily.  Start taking on:  03/13/2013     gabapentin 300 MG capsule  Commonly known as:  NEURONTIN  Take 1 capsule (300 mg total) by mouth 2 (two) times daily.     insulin NPH 100 UNIT/ML injection  Commonly known as:  HUMULIN N,NOVOLIN N  Inject 25-44 Units into the skin 2 (two) times daily before a meal. Take 44 units in the morning and 25 units in the evening     insulin regular 100 units/mL injection  Commonly known as:  NOVOLIN R,HUMULIN R  Inject 15-30 Units into the skin 2 (two) times daily before a meal. Take 30 units in the morning and 15 units in the evening     KRILL OIL PO  Take 1 capsule by mouth daily. Mega Red     levofloxacin 500 MG tablet  Commonly known as:  LEVAQUIN  Take 1 tablet (500 mg total) by mouth every other day.     levothyroxine 25 MCG tablet  Commonly known as:  SYNTHROID, LEVOTHROID  Take  25 mcg by mouth daily before breakfast.     LORazepam 0.5 MG tablet  Commonly known as:  ATIVAN  Take 0.5 mg by mouth at bedtime.     multivitamin with minerals tablet  Take 1 tablet by mouth daily.     rosuvastatin 10 MG tablet  Commonly known as:  CRESTOR  Take 1 tablet (10 mg total) by mouth daily.     torsemide 20 MG tablet  Commonly known as:  DEMADEX  Take 1 tablet (20 mg total) by mouth daily.     traMADol 50 MG tablet  Commonly known as:  Janean Sark  Take 1 tablet (50 mg total) by mouth 2 (two) times daily as needed for pain.     Vitamin D (Ergocalciferol) 50000 UNITS Caps capsule  Commonly known as:  DRISDOL  Take 50,000 Units by mouth every 7 (seven) days. On Tuesday     warfarin 5 MG tablet  Commonly known as:  COUMADIN  Take 5-7.5 mg by mouth daily. Take 1 tablet on Mon, Wed, and Fri.  Take 1.5 tablet on Sun, Tues, Thurs, and Sat.       Allergies  Allergen Reactions  . Sulfonamide Derivatives Other (See Comments)    Unknown; "childhood allergy/mother"       Follow-up Information   Follow up with Corky Crafts., MD In 1 week. (follow up for HF, titrate diuretics as needed.)    Specialty:  Cardiology   Contact information:   [address] [place] [address] [phone]        The results of significant diagnostics from this hospitalization (including imaging, microbiology, ancillary and laboratory) are listed below for reference.    Significant Diagnostic Studies: Dg Chest 2 View  03/07/2013   CLINICAL DATA:  Respiratory failure  EXAM: CHEST  2 VIEW  COMPARISON:  Prior chest x-ray 03/05/2013  FINDINGS: Very low inspiratory volumes. Stable cardiomegaly. Improving pulmonary edema. Stable left subclavian approach cardiac rhythm maintenance device. No pneumothorax or pleural effusion.  IMPRESSION: Improving pulmonary edema but persistent very low inspiratory volumes with bibasilar atelectasis.   Electronically Signed   By: Malachy Moan M.D.   On: 03/07/2013 08:21   Dg Chest Port 1 View  03/05/2013   CLINICAL DATA:  Fever. Shortness of breath.  EXAM: PORTABLE CHEST - 1 VIEW  COMPARISON:  January 28, 2012  FINDINGS: The heart size and mediastinal contours are stable. The heart size is enlarged. Cardiac pacemaker is unchanged. Patient is status post prior median sternotomy. There is central pulmonary edema. There is probable patchy consolidation of bilateral lung bases but evaluation is limited by underpenetration of the lung bases. The visualized skeletal structures are stable.  IMPRESSION: Congestive heart failure. There is probable patchy consolidation of bilateral lung bases but evaluation is limited by underpenetration of the lung bases.   Electronically Signed   By: Sherian Rein M.D.   On: 03/05/2013 16:20    Microbiology: Recent Results (from the past 240 hour(s))  MRSA PCR SCREENING     Status: None   Collection Time    03/05/13  8:35 PM      Result Value Range Status   MRSA by PCR NEGATIVE  NEGATIVE Final   Comment:            The GeneXpert MRSA Assay (FDA     approved for NASAL specimens     only), is one component of a     comprehensive MRSA colonization     surveillance program. It is not     intended to diagnose MRSA     infection nor to guide or     monitor treatment for     MRSA infections.  URINE CULTURE     Status: None   Collection Time    03/10/13 12:33 PM      Result Value Range Status   Specimen Description URINE, CLEAN CATCH   Final   Special Requests NONE   Final   Culture  Setup Time     Final   Value: 03/10/2013 17:08     Performed at [facility]  [place] Count     Final   Value: 2,000 COLONIES/ML     Performed at [facility]   Culture     Final   Value: INSIGNIFICANT GROWTH     Performed at [facility]   Report Status 03/11/2013 FINAL   Final     Labs: Basic Metabolic Panel:  Recent Labs Lab 03/07/13 0525 03/08/13 0359 03/09/13 1057  03/10/13 0557 03/11/13 0615  NA 133* 131* 130* 130* 130*  K 4.8 4.8 4.9 5.0 5.1  CL 93* 93* 90* 92* 94*  CO2 29 28 28 25 27   GLUCOSE 141* 121* 245* 114* 99  BUN 55* 72* 91* 96* 99*  CREATININE 1.96* 2.36* 2.58* 2.50* 2.15*  CALCIUM 8.9 8.5 8.4 8.0* 8.4   Liver Function Tests: No results found for this basename: AST, ALT, ALKPHOS, BILITOT, PROT, ALBUMIN,  in the last 168 hours No results found for this basename: LIPASE, AMYLASE,  in the last 168 hours No results found for this basename: AMMONIA,  in the last 168 hours CBC:  Recent Labs Lab 03/05/13 1608 03/07/13 0525 03/08/13 0359  WBC 15.2* 14.1* 13.6*  HGB 11.8* 11.2* 10.5*  HCT 37.3 34.0* 32.0*  MCV 95.4 93.7 93.3  PLT 183 195 186   Cardiac Enzymes:  Recent Labs Lab 03/05/13 1600 03/05/13 2200 03/06/13 0630 03/06/13 0920  TROPONINI <0.30 <0.30 <0.30 <0.30   BNP: BNP (last 3 results)  Recent Labs  03/05/13 1608  PROBNP 3671.0*   CBG:  Recent Labs Lab 03/11/13 1123 03/11/13 1553 03/11/13 2123 03/12/13 0556 03/12/13 1122  GLUCAP 109* 89 133* 113* 161*       Signed:  FELIZ ORTIZ, ABRAHAM  [facility] 03/12/2013, 12:31 PM

## 2013-03-12 NOTE — Progress Notes (Signed)
ANTICOAGULATION CONSULT NOTE - Follow Up Consult  Pharmacy Consult for Coumadin Indication: atrial fibrillation   Allergies  Allergen Reactions  . Sulfonamide Derivatives Other (See Comments)    Unknown; "childhood allergy/mother"    Patient Measurements: Height: 5\' 4"  (162.6 cm) Weight: 277 lb 6.4 oz (125.828 kg) (scale c) IBW/kg (Calculated) : 54.7    Vital Signs: Temp: 97.3 F (36.3 C) (10/30 0915) Temp src: Oral (10/30 0915) BP: 154/94 mmHg (10/30 0915) Pulse Rate: 67 (10/30 0915)  Labs:  Recent Labs  03/09/13 1057 03/10/13 0557 03/11/13 0615 03/12/13 0357  LABPROT  --  17.8* 21.6* 21.4*  INR  --  1.51* 1.94* 1.92*  CREATININE 2.58* 2.50* 2.15*  --     Estimated Creatinine Clearance: 31.9 ml/min (by C-G formula based on Cr of 2.15).   Medications:  Scheduled:  . albuterol  2.5 mg Nebulization TID  . allopurinol  100 mg Oral Daily  . antiseptic oral rinse  15 mL Mouth Rinse BID  . aspirin EC  81 mg Oral Daily  . budesonide (PULMICORT) nebulizer solution  0.25 mg Nebulization BID  . carvedilol  12.5 mg Oral BID WC  . citalopram  20 mg Oral Daily  . dextromethorphan  15 mg Oral BID  . gabapentin  300 mg Oral BID  . guaiFENesin  600 mg Oral BID  . insulin aspart  0-9 Units Subcutaneous TID WC  . insulin aspart  15 Units Subcutaneous Q supper  . insulin aspart  30 Units Subcutaneous Q breakfast  . insulin NPH  30 Units Subcutaneous Q supper  . insulin NPH  45 Units Subcutaneous QAC breakfast  . levofloxacin  500 mg Oral Q48H  . levothyroxine  25 mcg Oral QAC breakfast  . LORazepam  0.5 mg Oral QHS  . polyethylene glycol  17 g Oral Daily  . sodium chloride  3 mL Intravenous Q12H  . Warfarin - Pharmacist Dosing Inpatient   Does not apply q1800    Assessment: 72yo female with AFib. Also has h/o mural thrombus.  INR is 1.92 today. Remains below therapeutic goal.  No bleeding problems noted.  PTA coumadin dose was 7.5 qTTHSS and  5mg  qMWF. Admission INR  was subtherapeutic at 1.70 with last dose taken 10/22, one day prior to admit.  Inpatient he has received higher doses of 10mg , 10mg , 12.5 mg ,12.5mg  and 7.5 mg over past 5 days.  INR has remained 1.5-1.9 range.   Levofloxacin started on 03/10/13 for right leg cellulitis and presumed UTI. Levofloxacin may increase Coumadin effect. Thus reduced coumadin dose yesterday.  INR decreased slightly today from 1.94 to 1.92  Morbid obesity: Weight 125.8 kg, Height= 64 inches,  BMI = 47.6  Goal of Therapy:  INR =2-3 Monitor platelets by anticoagulation protocol: Yes   Plan:  Coumadin 10 mg po x1 Daily PT/INR. Watch for s/s of bleeding.  Noah Delaine, RPh Clinical Pharmacist Pager: [phone] 03/12/2013 11:10AM

## 2013-03-13 ENCOUNTER — Inpatient Hospital Stay (HOSPITAL_COMMUNITY): Payer: Medicare Other

## 2013-03-13 LAB — CBC WITH DIFFERENTIAL/PLATELET
Basophils Relative: 0 % (ref 0–1)
Eosinophils Absolute: 0 10*3/uL (ref 0.0–0.7)
Eosinophils Relative: 0 % (ref 0–5)
HCT: 33 % — ABNORMAL LOW (ref 36.0–46.0)
Hemoglobin: 10.9 g/dL — ABNORMAL LOW (ref 12.0–15.0)
Lymphocytes Relative: 8 % — ABNORMAL LOW (ref 12–46)
MCHC: 33 g/dL (ref 30.0–36.0)
Monocytes Relative: 9 % (ref 3–12)
Neutro Abs: 14.3 10*3/uL — ABNORMAL HIGH (ref 1.7–7.7)
Neutrophils Relative %: 83 % — ABNORMAL HIGH (ref 43–77)
Platelets: 264 10*3/uL (ref 150–400)
RBC: 3.56 MIL/uL — ABNORMAL LOW (ref 3.87–5.11)

## 2013-03-13 LAB — URINE MICROSCOPIC-ADD ON

## 2013-03-13 LAB — GLUCOSE, CAPILLARY
Glucose-Capillary: 187 mg/dL — ABNORMAL HIGH (ref 70–99)
Glucose-Capillary: 189 mg/dL — ABNORMAL HIGH (ref 70–99)
Glucose-Capillary: 227 mg/dL — ABNORMAL HIGH (ref 70–99)

## 2013-03-13 LAB — BLOOD GAS, ARTERIAL
Acid-Base Excess: 4.8 mmol/L — ABNORMAL HIGH (ref 0.0–2.0)
Bicarbonate: 29.1 mEq/L — ABNORMAL HIGH (ref 20.0–24.0)
Delivery systems: POSITIVE
Delivery systems: POSITIVE
FIO2: 0.6 %
O2 Saturation: 98.1 %
Patient temperature: 98.6
TCO2: 30.8 mmol/L (ref 0–100)
TCO2: 32.4 mmol/L (ref 0–100)
pCO2 arterial: 60.7 mmHg (ref 35.0–45.0)
pH, Arterial: 7.33 — ABNORMAL LOW (ref 7.350–7.450)
pO2, Arterial: 65.8 mmHg — ABNORMAL LOW (ref 80.0–100.0)

## 2013-03-13 LAB — BASIC METABOLIC PANEL
BUN: 82 mg/dL — ABNORMAL HIGH (ref 6–23)
CO2: 30 mEq/L (ref 19–32)
CO2: 30 mEq/L (ref 19–32)
Calcium: 8.5 mg/dL (ref 8.4–10.5)
Chloride: 97 mEq/L (ref 96–112)
Chloride: 98 mEq/L (ref 96–112)
GFR calc non Af Amer: 34 mL/min — ABNORMAL LOW (ref >90)
Glucose, Bld: 179 mg/dL — ABNORMAL HIGH (ref 70–99)
Glucose, Bld: 214 mg/dL — ABNORMAL HIGH (ref 70–99)
Potassium: 5.7 mEq/L — ABNORMAL HIGH (ref 3.5–5.1)
Potassium: 4.6 mEq/L (ref 3.5–5.1)
Sodium: 136 mEq/L (ref 135–145)
Sodium: 138 mEq/L (ref 135–145)

## 2013-03-13 LAB — POCT I-STAT 3, ART BLOOD GAS (G3+)
Bicarbonate: 32.5 mEq/L — ABNORMAL HIGH (ref 20.0–24.0)
TCO2: 35 mmol/L (ref 0–100)
pCO2 arterial: 70.4 mmHg (ref 35.0–45.0)
pH, Arterial: 7.27 — ABNORMAL LOW (ref 7.350–7.450)
pO2, Arterial: 84 mmHg (ref 80.0–100.0)

## 2013-03-13 LAB — PROTIME-INR
INR: 2.09 — ABNORMAL HIGH (ref 0.00–1.49)
Prothrombin Time: 22.8 seconds — ABNORMAL HIGH (ref 11.6–15.2)

## 2013-03-13 LAB — PROCALCITONIN: Procalcitonin: 0.14 ng/mL

## 2013-03-13 LAB — URINALYSIS, ROUTINE W REFLEX MICROSCOPIC
Glucose, UA: NEGATIVE mg/dL
Specific Gravity, Urine: 1.016 (ref 1.005–1.030)
Urobilinogen, UA: 0.2 mg/dL (ref 0.0–1.0)
pH: 5 (ref 5.0–8.0)

## 2013-03-13 LAB — TROPONIN I
Troponin I: <0.3 ng/mL (ref <0.30)
Troponin I: <0.3 ng/mL (ref <0.30)

## 2013-03-13 LAB — LACTIC ACID, PLASMA: Lactic Acid, Venous: 0.7 mmol/L (ref 0.5–2.2)

## 2013-03-13 MED ORDER — FUROSEMIDE 10 MG/ML IJ SOLN
40.0000 mg | Freq: Once | INTRAMUSCULAR | Status: AC
  Administered 2013-03-13: 40 mg via INTRAVENOUS
  Filled 2013-03-13: qty 4

## 2013-03-13 MED ORDER — VECURONIUM BROMIDE 10 MG IV SOLR: 5.5000 mg | INTRAVENOUS | Status: AC | PRN

## 2013-03-13 MED ORDER — MIDAZOLAM HCL 2 MG/2ML IJ SOLN
1.0000 mg | INTRAMUSCULAR | Status: DC | PRN
  Filled 2013-03-13: qty 2

## 2013-03-13 MED ORDER — MIDAZOLAM HCL 2 MG/2ML IJ SOLN
INTRAMUSCULAR | Status: AC
  Administered 2013-03-13: 2 mg via INTRAVENOUS
  Filled 2013-03-13: qty 2

## 2013-03-13 MED ORDER — MIDAZOLAM BOLUS VIA INFUSION
1.0000 mg | INTRAVENOUS | Status: DC | PRN
Start: 2013-03-13 — End: 2013-03-16
  Filled 2013-03-13: qty 2

## 2013-03-13 MED ORDER — ETOMIDATE 2 MG/ML IV SOLN
16.5000 mg | INTRAVENOUS | Status: AC | PRN
  Administered 2013-03-13: 20 mg via INTRAVENOUS

## 2013-03-13 MED ORDER — PRO-STAT SUGAR FREE PO LIQD
60.0000 mL | Freq: Four times a day (QID) | ORAL | Status: DC
  Filled 2013-03-13 (×4): qty 60

## 2013-03-13 MED ORDER — FENTANYL CITRATE 0.05 MG/ML IJ SOLN: 25.0000 ug | INTRAMUSCULAR | Status: DC | PRN

## 2013-03-13 MED ORDER — MIDAZOLAM HCL 2 MG/2ML IJ SOLN
INTRAMUSCULAR | Status: AC
  Administered 2013-03-13: 2 mg
  Filled 2013-03-13: qty 2

## 2013-03-13 MED ORDER — SODIUM CHLORIDE 0.9 % IV SOLN
25.0000 ug/h | INTRAVENOUS | Status: DC
  Administered 2013-03-13: 100 ug/h via INTRAVENOUS
  Administered 2013-03-14: 150 ug/h via INTRAVENOUS
  Administered 2013-03-15 (×3): 250 ug/h via INTRAVENOUS
  Administered 2013-03-16: 125 ug/h via INTRAVENOUS
  Administered 2013-03-17: 250 ug/h via INTRAVENOUS
  Filled 2013-03-13 (×6): qty 50

## 2013-03-13 MED ORDER — ROCURONIUM BROMIDE 50 MG/5ML IV SOLN
55.0000 mg | INTRAVENOUS | Status: AC | PRN
  Filled 2013-03-13: qty 12

## 2013-03-13 MED ORDER — PIPERACILLIN-TAZOBACTAM 3.375 G IVPB
3.3750 g | Freq: Three times a day (TID) | INTRAVENOUS | Status: DC
  Administered 2013-03-13: 3.375 g via INTRAVENOUS
  Filled 2013-03-13 (×2): qty 50

## 2013-03-13 MED ORDER — SODIUM CHLORIDE 0.9 % IV SOLN
1500.0000 mg | INTRAVENOUS | Status: DC
  Administered 2013-03-14 – 2013-03-15 (×2): 1500 mg via INTRAVENOUS
  Filled 2013-03-13 (×2): qty 1500

## 2013-03-13 MED ORDER — SODIUM POLYSTYRENE SULFONATE 15 GM/60ML PO SUSP
30.0000 g | Freq: Once | ORAL | Status: DC
  Filled 2013-03-13: qty 120

## 2013-03-13 MED ORDER — FENTANYL BOLUS VIA INFUSION
25.0000 ug | Freq: Four times a day (QID) | INTRAVENOUS | Status: DC | PRN
  Administered 2013-03-16: 100 ug via INTRAVENOUS
  Filled 2013-03-13: qty 100

## 2013-03-13 MED ORDER — INSULIN DETEMIR 100 UNIT/ML ~~LOC~~ SOLN
30.0000 [IU] | Freq: Every day | SUBCUTANEOUS | Status: DC
  Filled 2013-03-13: qty 0.3

## 2013-03-13 MED ORDER — MIDAZOLAM HCL 2 MG/2ML IJ SOLN
2.0000 mg | Freq: Once | INTRAMUSCULAR | Status: AC
Start: 2013-03-13 — End: 2013-03-13
  Administered 2013-03-13: 2 mg via INTRAVENOUS

## 2013-03-13 MED ORDER — SODIUM CHLORIDE 0.9 % IV SOLN
1.0000 mg/h | INTRAVENOUS | Status: DC
  Administered 2013-03-13 – 2013-03-14 (×3): 2 mg/h via INTRAVENOUS
  Administered 2013-03-15: 4 mg/h via INTRAVENOUS
  Filled 2013-03-13 (×7): qty 10

## 2013-03-13 MED ORDER — PRO-STAT SUGAR FREE PO LIQD
60.0000 mL | Freq: Four times a day (QID) | ORAL | Status: DC
  Administered 2013-03-13 – 2013-03-16 (×13): 60 mL
  Filled 2013-03-13 (×16): qty 60

## 2013-03-13 MED ORDER — LIDOCAINE HCL (CARDIAC) 20 MG/ML IV SOLN: 83.0000 mg | INTRAVENOUS | Status: AC | PRN

## 2013-03-13 MED ORDER — FUROSEMIDE 10 MG/ML IJ SOLN
40.0000 mg | Freq: Two times a day (BID) | INTRAMUSCULAR | Status: DC
  Administered 2013-03-13 – 2013-03-14 (×3): 40 mg via INTRAVENOUS
  Filled 2013-03-13 (×5): qty 4

## 2013-03-13 MED ORDER — LORAZEPAM 0.5 MG PO TABS: 0.5000 mg | ORAL_TABLET | ORAL | Status: DC

## 2013-03-13 MED ORDER — SUCCINYLCHOLINE CHLORIDE 20 MG/ML IJ SOLN
83.0000 mg | INTRAMUSCULAR | Status: AC | PRN
  Filled 2013-03-13: qty 7.5

## 2013-03-13 MED ORDER — VANCOMYCIN HCL 10 G IV SOLR
2000.0000 mg | Freq: Once | INTRAVENOUS | Status: AC
  Administered 2013-03-13: 2000 mg via INTRAVENOUS
  Filled 2013-03-13: qty 2000

## 2013-03-13 MED ORDER — INSULIN ASPART 100 UNIT/ML ~~LOC~~ SOLN
0.0000 [IU] | SUBCUTANEOUS | Status: DC
  Administered 2013-03-13: 4 [IU] via SUBCUTANEOUS
  Administered 2013-03-13 – 2013-03-14 (×4): 7 [IU] via SUBCUTANEOUS
  Administered 2013-03-14: 4 [IU] via SUBCUTANEOUS
  Administered 2013-03-14: 7 [IU] via SUBCUTANEOUS
  Administered 2013-03-14: 4 [IU] via SUBCUTANEOUS
  Administered 2013-03-15 (×2): 7 [IU] via SUBCUTANEOUS
  Administered 2013-03-15 (×2): 11 [IU] via SUBCUTANEOUS
  Administered 2013-03-15: 7 [IU] via SUBCUTANEOUS
  Administered 2013-03-15 (×2): 4 [IU] via SUBCUTANEOUS
  Administered 2013-03-16: 7 [IU] via SUBCUTANEOUS
  Administered 2013-03-16: 4 [IU] via SUBCUTANEOUS
  Administered 2013-03-16: 15 [IU] via SUBCUTANEOUS
  Administered 2013-03-16: 7 [IU] via SUBCUTANEOUS
  Administered 2013-03-16 – 2013-03-17 (×2): 4 [IU] via SUBCUTANEOUS
  Administered 2013-03-17: 7 [IU] via SUBCUTANEOUS
  Administered 2013-03-17: 4 [IU] via SUBCUTANEOUS
  Administered 2013-03-17: 7 [IU] via SUBCUTANEOUS
  Administered 2013-03-17: 11 [IU] via SUBCUTANEOUS
  Administered 2013-03-18: 15 [IU] via SUBCUTANEOUS
  Administered 2013-03-18: 11 [IU] via SUBCUTANEOUS
  Administered 2013-03-18: 10 [IU] via SUBCUTANEOUS
  Administered 2013-03-18 (×2): 7 [IU] via SUBCUTANEOUS
  Administered 2013-03-18 – 2013-03-19 (×2): 11 [IU] via SUBCUTANEOUS
  Administered 2013-03-19: 7 [IU] via SUBCUTANEOUS
  Administered 2013-03-19 (×2): 11 [IU] via SUBCUTANEOUS
  Administered 2013-03-19: 7 [IU] via SUBCUTANEOUS
  Administered 2013-03-19: 11 [IU] via SUBCUTANEOUS
  Administered 2013-03-20: 15 [IU] via SUBCUTANEOUS
  Administered 2013-03-20 (×2): 7 [IU] via SUBCUTANEOUS
  Administered 2013-03-20: 3 [IU] via SUBCUTANEOUS
  Administered 2013-03-20: 4 [IU] via SUBCUTANEOUS
  Administered 2013-03-20 – 2013-03-21 (×2): 15 [IU] via SUBCUTANEOUS
  Administered 2013-03-21: 4 [IU] via SUBCUTANEOUS
  Administered 2013-03-21: 7 [IU] via SUBCUTANEOUS

## 2013-03-13 MED ORDER — FUROSEMIDE 10 MG/ML IJ SOLN
40.0000 mg | Freq: Once | INTRAMUSCULAR | Status: AC
  Administered 2013-03-13: 02:00:00 40 mg via INTRAVENOUS

## 2013-03-13 MED ORDER — BIOTENE DRY MOUTH MT LIQD
15.0000 mL | Freq: Four times a day (QID) | OROMUCOSAL | Status: DC
  Administered 2013-03-13 – 2013-03-21 (×32): 15 mL via OROMUCOSAL

## 2013-03-13 MED ORDER — CARVEDILOL 12.5 MG PO TABS
12.5000 mg | ORAL_TABLET | Freq: Two times a day (BID) | ORAL | Status: DC
  Administered 2013-03-13 – 2013-03-18 (×10): 12.5 mg
  Filled 2013-03-13 (×12): qty 1

## 2013-03-13 MED ORDER — LEVOTHYROXINE SODIUM 25 MCG PO TABS
25.0000 ug | ORAL_TABLET | Freq: Every day | ORAL | Status: DC
  Administered 2013-03-14 – 2013-03-18 (×5): 25 ug
  Filled 2013-03-13 (×6): qty 1

## 2013-03-13 MED ORDER — CHLORHEXIDINE GLUCONATE 0.12 % MT SOLN
15.0000 mL | Freq: Two times a day (BID) | OROMUCOSAL | Status: DC
  Administered 2013-03-13 – 2013-03-21 (×15): 15 mL via OROMUCOSAL
  Filled 2013-03-13 (×16): qty 15

## 2013-03-13 MED ORDER — MIDAZOLAM HCL 2 MG/2ML IJ SOLN
2.0000 mg | Freq: Once | INTRAMUSCULAR | Status: AC
  Administered 2013-03-13: 2 mg via INTRAVENOUS

## 2013-03-13 MED ORDER — ATROPINE SULFATE 1 MG/ML IJ SOLN: 1.0000 mg | INTRAMUSCULAR | Status: AC | PRN

## 2013-03-13 MED ORDER — LEVOFLOXACIN IN D5W 500 MG/100ML IV SOLN
500.0000 mg | INTRAVENOUS | Status: DC
  Administered 2013-03-14: 500 mg via INTRAVENOUS
  Filled 2013-03-13: qty 100

## 2013-03-13 MED ORDER — PIPERACILLIN-TAZOBACTAM 3.375 G IVPB: 3.3750 g | Freq: Four times a day (QID) | INTRAVENOUS | Status: DC

## 2013-03-13 MED ORDER — OSMOLITE 1.5 CAL PO LIQD
1000.0000 mL | ORAL | Status: DC
  Administered 2013-03-13 – 2013-03-16 (×4): 1000 mL
  Filled 2013-03-13 (×7): qty 1000

## 2013-03-13 MED ORDER — SODIUM POLYSTYRENE SULFONATE 15 GM/60ML PO SUSP
30.0000 g | Freq: Once | ORAL | Status: AC
  Administered 2013-03-13: 30 g via RECTAL
  Filled 2013-03-13: qty 120

## 2013-03-13 MED ORDER — FENTANYL CITRATE 0.05 MG/ML IJ SOLN
INTRAMUSCULAR | Status: AC
  Administered 2013-03-13: 50 ug
  Filled 2013-03-13: qty 2

## 2013-03-13 MED ORDER — LORAZEPAM 2 MG/ML IJ SOLN
0.5000 mg | Freq: Once | INTRAMUSCULAR | Status: AC
  Administered 2013-03-13: 0.5 mg via INTRAVENOUS
  Filled 2013-03-13: qty 1

## 2013-03-13 MED ORDER — DEXTROSE 5 % IV SOLN
1.0000 g | Freq: Two times a day (BID) | INTRAVENOUS | Status: DC
  Administered 2013-03-13 – 2013-03-17 (×9): 1 g via INTRAVENOUS
  Filled 2013-03-13 (×11): qty 1

## 2013-03-13 MED ORDER — VITAL AF 1.2 CAL PO LIQD: 1000.0000 mL | ORAL | Status: DC

## 2013-03-13 MED ORDER — ALBUTEROL SULFATE (5 MG/ML) 0.5% IN NEBU: 2.5000 mg | INHALATION_SOLUTION | RESPIRATORY_TRACT | Status: DC | PRN

## 2013-03-13 MED ORDER — ADULT MULTIVITAMIN LIQUID CH
5.0000 mL | Freq: Every day | ORAL | Status: DC
  Administered 2013-03-13 – 2013-03-17 (×5): 5 mL
  Filled 2013-03-13 (×5): qty 5

## 2013-03-13 MED ORDER — DEXTROSE 5 % IV SOLN
1.0000 g | INTRAVENOUS | Status: DC
  Filled 2013-03-13: qty 10

## 2013-03-13 MED ORDER — PANTOPRAZOLE SODIUM 40 MG PO PACK
40.0000 mg | PACK | Freq: Every day | ORAL | Status: DC
  Administered 2013-03-14 – 2013-03-16 (×3): 40 mg
  Filled 2013-03-13 (×3): qty 20

## 2013-03-13 NOTE — Procedures (Signed)
Central Venous Catheter Insertion Procedure Note Destiny [name] [id number] 1940-09-06  Procedure: Insertion of Central Venous Catheter Indications: Assessment of intravascular volume, Drug and/or fluid administration and Frequent blood sampling  Procedure Details Consent: Risks of procedure as well as the alternatives and risks of each were explained to the (patient/caregiver).  Consent for procedure obtained. Time Out: Verified patient identification, verified procedure, site/side was marked, verified correct patient position, special equipment/implants available, medications/allergies/relevent history reviewed, required imaging and test results available.  Performed  Maximum sterile technique was used including antiseptics, cap, gloves, gown, hand hygiene, mask and sheet. Skin prep: Chlorhexidine; local anesthetic administered A antimicrobial bonded/coated triple lumen catheter was placed in the left internal jugular vein using the Seldinger technique. Ultrasound guidance used.yes Catheter placed to 20 cm. Blood aspirated via all 3 ports and then flushed x 3. Line sutured x 2 and dressing applied.  Evaluation Blood flow good Complications: No apparent complications Patient did tolerate procedure well. Chest X-ray ordered to verify placement.  CXR: pending.  Scribed by Kandice Robinsons, RN Student ACNP [employer] for Anders Simmonds, NP-C  Coralyn Helling, MD [facility] Pulmonary/Critical Care 03/13/2013, 10:28 AM Pager:  [phone] After 3pm call: [phone]

## 2013-03-13 NOTE — Progress Notes (Signed)
@  0845 pt arrived on unit at this time intubated from [facility]. Pt was transported by Charge RT with no complications. Pt remains on current settings per MD order. RT will continue to monitor.

## 2013-03-13 NOTE — Progress Notes (Signed)
eLink Physician-Brief Progress Note Patient Name: Destiny [name] DOB: [birth year] MRN: [id number]  Date of Service  03/13/2013   HPI/Events of Note  Patient self-extubated at 2310.  Is alert and oriented in NAD, RR of 25 with BP 156/54 (74), sats of 98% on NRB.  No accessory muscle use.  eICU Interventions  Plan: ABG in 1 hour post self-extubation Continue to monitor - May require BiPAP   Intervention Category Intermediate Interventions: Respiratory distress - evaluation and management  DETERDING,ELIZABETH 03/13/2013, 11:13 PM

## 2013-03-13 NOTE — Progress Notes (Signed)
[facility] PROGRESS NOTE Interim History: 72 yo female with h/o CHF, s/p cabg, ckd, aicd, on home oxygen 2 L [place] (this she has increased over the past 2 weeks), was prev only prn during HS with CPAP. Presented with several days of worsening SOB and cough. No CP. No n/v. No LE edema. No cough. No h/o copd. Arrived at urgent care and was found to be hypoxic with sats on RA 75%. Started on oxygen and given lasix 80mg  IV and NTG gtt then transferred to telemetry   Filed Weights   03/11/13 0454 03/12/13 0431 03/13/13 0500  Weight: 126.6 kg (279 lb 1.6 oz) 125.828 kg (277 lb 6.4 oz) 121.4 kg (267 lb 10.2 oz)        Intake/Output Summary (Last 24 hours) at 03/13/13 0725 Last data filed at 03/13/13 0528  Gross per 24 hour  Intake   1510 ml  Output   1901 ml  Net   -391 ml     Assessment/Plan: Acute respiratory failure with hypoxia due to Acute systolic and diastolic heart failure, likely OHS and OSA and PNA: - develop fevers again on 10.31.2014, with and elevation in WBC CXR repeated showed worsening PNA. - antibiotics broaden (vanc and cefepime) ? wosening plumonary edema vs infectious etiology. Started on IV lasix. Strict I and o's. - Place a PICC line, consult PCCM. Family would like pt intubated if needed. - repeat blood cultures  ARF on CKD (chronic kidney disease) stage 3, GFR 30-59 ml/min  - Lasix stopped 10/27- repeat BMET 10/28 demonstrated worsened renal function (9.1: 2.58). Cr at baseline on 10.30.2014. - resume lasix IV 10.31.2014, strict I and O's.   Hyperkalemia on 10.30.2014: - Most likely due to pre-renal. - Kayexalate and fluid orally. - repeated b-met on 10.31.2014, unchanged repeat kayexalate.  OSA (obstructive sleep apnea)  -husband brought in home CPAP machine   DIABETES MELLITUS, WITH AUTONOMIC NEUROPATHY  - D/c NPH start levemir plus SSI. - PT is NPO. At home 70/30 30 units am and 15 pm.  HYPERTENSION  -BP has been occasionally soft so cautious use of  anti HTN meds   ATRIAL FIBRILLATION, PAROXYSMAL  -rate controlled  -rationale for Warfarin (also h/o mural thrombus)   Hypothyroidism  -cont usual Synthroid   Cellulitis of right leg  - follow on Levqauin - exam much improved today, now only suggestive of leukocytoclastic vasculitis     Code Status: Full  Family Communication: Patient only, no family at bedside  Disposition Plan/Expected LOS: home when medically stable.   Consultants:  Cardiology  PCCM  Procedures: ECHO: EF 35-40% with Grade 2 Diastolic dysf   Antibiotics:  levaquin 10.28.2014-10.31.2014  Vanc and  Cefepime10.31.2014  Zosyn 1 dose on 10.31.2014    HPI/Subjective: Able to follow some commands.  Objective: Filed Vitals:   03/13/13 0230 03/13/13 0300 03/13/13 0337 03/13/13 0500  BP: 185/95 152/52    Pulse: 87 87 87   Temp:      TempSrc:      Resp:  25 33   Height:      Weight:    121.4 kg (267 lb 10.2 oz)  SpO2:  94% 95%      Exam:  General: Alert, awake, oriented x2, in no acute distress.  HEENT: No bruits, no goiter.  Heart: Regular rate and rhythm, without murmurs, rubs, gallops.  Lungs: Good air movement,  Abdomen: Soft, nontender, nondistended, positive bowel sounds.     Data Reviewed: Basic Metabolic Panel:  Recent Labs  Lab 03/09/13 1057 03/10/13 0557 03/11/13 0615 03/12/13 1400 03/13/13 0300  NA 130* 130* 130* 132* 136  K 4.9 5.0 5.1 5.7* 5.7*  CL 90* 92* 94* 95* 97  CO2 28 25 27 29 30   GLUCOSE 245* 114* 99 187* 179*  BUN 91* 96* 99* 91* 80*  CREATININE 2.58* 2.50* 2.15* 1.67* 1.51*  CALCIUM 8.4 8.0* 8.4 8.6 9.0   Liver Function Tests: No results found for this basename: AST, ALT, ALKPHOS, BILITOT, PROT, ALBUMIN,  in the last 168 hours No results found for this basename: LIPASE, AMYLASE,  in the last 168 hours No results found for this basename: AMMONIA,  in the last 168 hours CBC:  Recent Labs Lab 03/07/13 0525 03/08/13 0359 03/13/13 0300  WBC 14.1*  13.6* 17.2*  NEUTROABS  --   --  14.3*  HGB 11.2* 10.5* 10.9*  HCT 34.0* 32.0* 33.0*  MCV 93.7 93.3 92.7  PLT 195 186 264   Cardiac Enzymes:  Recent Labs Lab 03/06/13 0920 03/13/13 0300  TROPONINI <0.30 <0.30   BNP (last 3 results)  Recent Labs  03/05/13 1608 03/13/13 0300  PROBNP 3671.0* 4437.0*   CBG:  Recent Labs Lab 03/11/13 2123 03/12/13 0556 03/12/13 1122 03/12/13 1629 03/12/13 2119  GLUCAP 133* 113* 161* 241* 190*    Recent Results (from the past 240 hour(s))  MRSA PCR SCREENING     Status: None   Collection Time    03/05/13  8:35 PM      Result Value Range Status   MRSA by PCR NEGATIVE  NEGATIVE Final   Comment:            The GeneXpert MRSA Assay (FDA     approved for NASAL specimens     only), is one component of a     comprehensive MRSA colonization     surveillance program. It is not     intended to diagnose MRSA     infection nor to guide or     monitor treatment for     MRSA infections.  URINE CULTURE     Status: None   Collection Time    03/10/13 12:33 PM      Result Value Range Status   Specimen Description URINE, CLEAN CATCH   Final   Special Requests NONE   Final   Culture  Setup Time     Final   Value: 03/10/2013 17:08     Performed at [facility] Count     Final   Value: 2,000 COLONIES/ML     Performed at [facility]   Culture     Final   Value: INSIGNIFICANT GROWTH     Performed at [facility]   Report Status 03/11/2013 FINAL   Final     Studies: Dg Chest Port 1 View  03/13/2013   CLINICAL DATA:  Hypoxia  EXAM: PORTABLE CHEST - 1 VIEW  COMPARISON:  03/07/2013  FINDINGS: Previous median sternotomy. Left subclavian AICD stable. Moderate cardiomegaly stable. New extensive interstitial and airspace edema or infiltrates, right worse than left. Possible layering pleural effusions.  IMPRESSION: 1. Significant worsening of asymmetric edema or infiltrates, right greater than left.   Electronically  Signed   By: Oley Balm M.D.   On: 03/13/2013 01:35    Scheduled Meds: . albuterol  2.5 mg Nebulization TID  . allopurinol  100 mg Oral Daily  . antiseptic oral rinse  15 mL Mouth Rinse BID  . aspirin  EC  81 mg Oral Daily  . budesonide (PULMICORT) nebulizer solution  0.25 mg Nebulization BID  . carvedilol  12.5 mg Oral BID WC  . citalopram  20 mg Oral Daily  . dextromethorphan  15 mg Oral BID  . gabapentin  300 mg Oral BID  . guaiFENesin  600 mg Oral BID  . insulin aspart  0-9 Units Subcutaneous TID WC  . insulin aspart  15 Units Subcutaneous Q supper  . insulin aspart  30 Units Subcutaneous Q breakfast  . insulin NPH  30 Units Subcutaneous Q supper  . insulin NPH  45 Units Subcutaneous QAC breakfast  . levofloxacin  500 mg Oral Q48H  . levothyroxine  25 mcg Oral QAC breakfast  . LORazepam  0.5 mg Oral QHS  . piperacillin-tazobactam (ZOSYN)  IV  3.375 g Intravenous Q8H  . sodium chloride  3 mL Intravenous Q12H  . [START ON 03/14/2013] vancomycin  1,500 mg Intravenous Q24H  . Warfarin - Pharmacist Dosing Inpatient   Does not apply q1800   Continuous Infusions:    Marinda Elk  [facility] Pager [phone]. If 8PM-8AM, please contact night-coverage at [email or web address], password [email or web address] 03/13/2013, 7:25 AM  LOS: 8 days

## 2013-03-13 NOTE — Progress Notes (Signed)
ANTICOAGULATION CONSULT NOTE - Initial Consult  Pharmacy Consult for heparin (begin when INR < 2.0) Indication: afib  Allergies  Allergen Reactions  . Sulfonamide Derivatives Other (See Comments)    Unknown; "childhood allergy/mother"    Patient Measurements: Height: 5\' 4"  (162.6 cm) Weight: 277 lb 12.5 oz (126 kg) IBW/kg (Calculated) : 54.7 Heparin Dosing Weight: 84.5kg  Vital Signs: Temp: 100.5 F (38.1 C) (10/31 0158) Temp src: Axillary (10/31 0158) BP: 119/43 mmHg (10/31 1030) Pulse Rate: 72 (10/31 1030)  Labs:  Recent Labs  03/11/13 0615 03/12/13 0357 03/12/13 1400 03/13/13 0300 03/13/13 0900  HGB  --   --   --  10.9*  --   HCT  --   --   --  33.0*  --   PLT  --   --   --  264  --   LABPROT 21.6* 21.4*  --  22.8*  --   INR 1.94* 1.92*  --  2.09*  --   CREATININE 2.15*  --  1.67* 1.51*  --   TROPONINI  --   --   --  <0.30 <0.30    Estimated Creatinine Clearance: 45.5 ml/min (by C-G formula based on Cr of 1.51).  Medications:  Scheduled:  . antiseptic oral rinse  15 mL Mouth Rinse QID  . aspirin EC  81 mg Oral Daily  . carvedilol  12.5 mg Per Tube BID WC  . ceFEPime (MAXIPIME) IV  1 g Intravenous Q12H  . chlorhexidine  15 mL Mouth Rinse BID  . furosemide  40 mg Intravenous BID  . insulin aspart  0-20 Units Subcutaneous Q4H  . [START ON 03/14/2013] levofloxacin (LEVAQUIN) IV  500 mg Intravenous Q48H  . [START ON 03/14/2013] levothyroxine  25 mcg Per Tube QAC breakfast  . sodium chloride  3 mL Intravenous Q12H  . sodium polystyrene  30 g Rectal Once  . sodium polystyrene  30 g Oral Once  . [START ON 03/14/2013] vancomycin  1,500 mg Intravenous Q24H    Assessment: 72 yo female here with HF on coumadin PTA for afib (also history of mural thrombus).  Patient has developed respiratory failure and now intubated and coumadin is on hold. Heparin to begin when INR < 2.0, INR today is 2.09.  Goal of Therapy:  Heparin level 0.3-0.7 units/ml Monitor platelets by  anticoagulation protocol: Yes   Plan:  -Hold heparin  -Will follow daily PT/INR  Harland German, Pharm D 03/13/2013 10:46 AM

## 2013-03-13 NOTE — Progress Notes (Signed)
Patient desating on 4 L O2.  Rapid response and respiratory called. Cpap placed on patient and O2 sat increased 100% on Cpap.  NP on called notified. RN will continue to monitor. Louretta Parma, RN

## 2013-03-13 NOTE — Progress Notes (Signed)
Patient self extubated around 23:08.  Was placed on a NRB, sats 99%.  Will get an ABG in one hour, per MD, to see how she is doing.  RT will continue to monitor.

## 2013-03-13 NOTE — Progress Notes (Signed)
Pt on bipap , lethargic Dr. Robb Matar just ordered stat ABG , Kayeaxelate and PICC insertion Will continue to monitor.

## 2013-03-13 NOTE — Consult Note (Signed)
PULMONARY  / CRITICAL CARE MEDICINE  Name: Destiny [name] MRN: [id number] DOB: 1941/03/24    ADMISSION DATE:  03/05/2013 CONSULTATION DATE:  10/31  REFERRING MD : Robb Matar  PRIMARY SERVICE: [facility]-->PCCM   CHIEF COMPLAINT:  Acute resp failure   BRIEF PATIENT DESCRIPTION:  32 yof w/ mult co-morbids: AF, CHF (EF 35-40%), obesity, OSA. Admitted 10/23 w/ what was felt to be decompensated heart failure, +/- bronchitis and cellulitis. Initially got better from symptom stand-point. On 10/31 developed increased fever, increased work of breathing and delirium. PCCM asked to see for progressive respiratory failure.   SIGNIFICANT EVENTS / STUDIES:  ECHO 10/24: EF 35-40% gd II diastolic dysfxn  LINES / TUBES: OETT 1031>>> left IJ 10/31>>>  CULTURES: Respiratory culture 10/31>>> BCX2 10/31>>> UC 10/31>>>  ANTIBIOTICS: levaquin 10/30>> Zosyn 10/30>>> vanc 10/30>>>  HISTORY OF PRESENT ILLNESS:   72 yo female with h/o chf, s/p cabg, ckd st III, aicd, on home oxygen 2 L [place] cont (this has increased over the last month, was prev only prn during day) presented 10/23  with several days of worsening sob and cough. No cp. No n/v. No le edema or swelling. No cough. No h/o copd. Arrived at urgent care and was hypoxic on RA 75%. Started on oxygen and given lasix 80mg  iv and ntg gtt and transferred to [facility] stepdown. Initially felt better. Treated w/ NIPPV, diuresis, and abx initially for what was thought to possibly be cellulitis +/- bronchitis. On 10/27 developed persistent fever and although symptoms from resp standpoint, then on 10/31 fevers spiked again, developed some increase WOB, increased leukocytosis, and worsening MS. PCCM asked to see for acute respiratory failure  PAST MEDICAL HISTORY :  Past Medical History  Diagnosis Date  . LBBB (left bundle branch block)   . Carotid bruit     a. 10/19/2011 carotid duplex - Mild hard plaque bilaterally. Stable 40-59% bilateral ICA stenosis.   Marland Kitchen Dyslipidemia    . Hypertension   . Chronic combined systolic and diastolic CHF (congestive heart failure)     a. EF as low as 25% in 2006;  b. EF 60-65% in 12/2011;  c. 02/2013 Echo: EF 35-40, mod mid-dist antsept HK, Gr 2 DD, mild LVH.  . Back pain, chronic     "just when I walk; mass on 3rd and 4th vertebrae right lower back"  . Cardiomyopathy, ischemic     a. 2012 s/p SJM 3231-40 Uni BiV ICD, ser # [id number].  Marland Kitchen CAD (coronary artery disease)     a. 05/2001 CABG x4: LIMA->LAD, VG->D1, VG->D2, VG->RCA.  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 CKD (chronic kidney disease), stage III   . Biventricular implantable cardiac defibrillator in situ 2007, 2012    a. 2007;  b. 2012 Gen change: SJM 3231-40 Uni BiV ICD, ser # [id number].  Marland Kitchen Hypoxemia requiring supplemental oxygen   . Candidiasis of vulva and vagina   . Hypothyroidism   . 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 Truett Perna, in remission  . History of pneumonia 12/27/11    "3 times; it's been a long time ago"  . OSA on CPAP   . Gout ~ 08/2011  . PAF (paroxysmal atrial fibrillation)     on Coumadin  . DM (diabetes mellitus), type 2 with complications     insulin dependent, retinopathy, neuropathy  . Mural thrombus of left ventricle     before 2003, while on  Coumadin, No h/o CVA  . Morbid obesity with BMI of 45.0-49.9, adult     Ht. 5'4". BMI 47.2  . Vitamin D deficiency 06/09/2012    historical  . Olecranon bursitis of right elbow 12/29/2012  . History of recurrent UTIs    Past Surgical History  Procedure Laterality Date  . Cardiac catheterization  06/03/01  . Cholecystectomy  1970  . Tubal ligation  1972  . Abdominal hysterectomy  1982  . Insert / replace / remove pacemaker  2007; 2012    w/AICD  . Coronary artery bypass graft  2003    CABG X4  . Cataract extraction      left eye   Prior to Admission medications   Medication Sig Start Date End Date Taking? Authorizing Provider   albuterol (PROVENTIL HFA;VENTOLIN HFA) 108 (90 BASE) MCG/ACT inhaler Inhale 2 puffs into the lungs every 6 (six) hours as needed for wheezing or shortness of breath. 12/26/12  Yes Bradd Canary, MD  allopurinol (ZYLOPRIM) 100 MG tablet Take 100 mg by mouth daily.   Yes Historical Provider, MD  amLODipine (NORVASC) 5 MG tablet Take 1 tablet (5 mg total) by mouth daily. 12/31/12  Yes Bradd Canary, MD  aspirin EC 81 MG tablet Take 81 mg by mouth daily.   Yes Historical Provider, MD  calcium-vitamin D (OSCAL WITH D) 500-200 MG-UNIT per tablet Take 1 tablet by mouth daily with breakfast.   Yes Historical Provider, MD  carvedilol (COREG) 25 MG tablet Take 50 mg by mouth 2 (two) times daily with a meal.   Yes Historical Provider, MD  citalopram (CELEXA) 20 MG tablet Take 1 tablet (20 mg total) by mouth daily. 02/04/13  Yes Bradd Canary, MD  gabapentin (NEURONTIN) 300 MG capsule Take 1 capsule (300 mg total) by mouth 2 (two) times daily. 12/31/12  Yes Sandford Craze, NP  insulin NPH (HUMULIN N,NOVOLIN N) 100 UNIT/ML injection Inject 25-44 Units into the skin 2 (two) times daily before a meal. Take 44 units in the morning and 25 units in the evening   Yes Historical Provider, MD  insulin regular (NOVOLIN R,HUMULIN R) 100 units/mL injection Inject 15-30 Units into the skin 2 (two) times daily before a meal. Take 30 units in the morning and 15 units in the evening   Yes Historical Provider, MD  KRILL OIL PO Take 1 capsule by mouth daily. Mega Red   Yes Historical Provider, MD  levothyroxine (SYNTHROID, LEVOTHROID) 25 MCG tablet Take 25 mcg by mouth daily before breakfast.   Yes Historical Provider, MD  LORazepam (ATIVAN) 0.5 MG tablet Take 0.5 mg by mouth at bedtime.   Yes Historical Provider, MD  Multiple Vitamins-Minerals (MULTIVITAMIN WITH MINERALS) tablet Take 1 tablet by mouth daily.     Yes Historical Provider, MD  rosuvastatin (CRESTOR) 10 MG tablet Take 1 tablet (10 mg total) by mouth daily.  04/03/11  Yes Edwyna Perfect, MD  traMADol (ULTRAM) 50 MG tablet Take 1 tablet (50 mg total) by mouth 2 (two) times daily as needed for pain. 12/26/12  Yes Bradd Canary, MD  Vitamin D, Ergocalciferol, (DRISDOL) 50000 UNITS CAPS capsule Take 50,000 Units by mouth every 7 (seven) days. On Tuesday   Yes Historical Provider, MD  warfarin (COUMADIN) 5 MG tablet Take 5-7.5 mg by mouth daily. Take 1 tablet on Mon, Wed, and Fri.  Take 1.5 tablet on Sun, Tues, Thurs, and Sat.   Yes Historical Provider, MD  enalapril (VASOTEC) 10 MG  tablet Take 1 tablet (10 mg total) by mouth daily. 03/13/13   Marinda Elk, MD  levofloxacin (LEVAQUIN) 500 MG tablet Take 1 tablet (500 mg total) by mouth every other day. 03/12/13   Marinda Elk, MD  torsemide (DEMADEX) 20 MG tablet Take 1 tablet (20 mg total) by mouth daily. 03/12/13   Marinda Elk, MD   Allergies  Allergen Reactions  . Sulfonamide Derivatives Other (See Comments)    Unknown; "childhood allergy/mother"    FAMILY HISTORY:  Family History  Problem Relation Age of Onset  . Cancer Mother     kidney and female repo - died @ 8  . Heart disease Father     died @ 74  . Asthma Mother   . Stroke Father    SOCIAL HISTORY:  reports that she has never smoked. She has never used smokeless tobacco. She reports that she does not drink alcohol or use illicit drugs.  REVIEW OF SYSTEMS:   Unable   SUBJECTIVE:  Acutely ill, lethargic  VITAL SIGNS: Temp:  [97.3 F (36.3 C)-100.5 F (38.1 C)] 100.5 F (38.1 C) (10/31 0158) Pulse Rate:  [63-90] 76 (10/31 0725) Resp:  [16-34] 24 (10/31 0725) BP: (137-214)/(52-95) 152/52 mmHg (10/31 0300) SpO2:  [93 %-98 %] 93 % (10/31 0725) FiO2 (%):  [50 %-60 %] 60 % (10/31 0725) Weight:  [121.4 kg (267 lb 10.2 oz)] 121.4 kg (267 lb 10.2 oz) (10/31 0500) HEMODYNAMICS:   VENTILATOR SETTINGS: Vent Mode:  [-]  FiO2 (%):  [50 %-60 %] 60 % INTAKE / OUTPUT: Intake/Output     10/30 0701 - 10/31 0700  10/31 0701 - 11/01 0700   P.O. 960    I.V. (mL/kg)     IV Piggyback 550    Total Intake(mL/kg) 1510 (12.4)    Urine (mL/kg/hr) 1900 (0.7)    Stool 1 (0)    Total Output 1901     Net -391          Stool Occurrence 1 x      PHYSICAL EXAMINATION: General:  Morbidly obese white female, lethargic prior to intubation  Neuro:  Sp slurred, moved all ext oriented X 1 HEENT:  Neck large, unable to assess JVD, MMM  Cardiovascular:  Regular irregular  Lungs:  Decreased t/o + accessory muscle use  Abdomen:  Obese + bowel sounds  Musculoskeletal:  intact Skin:  Chronic LE swelling 2+ pulses   LABS:  CBC Recent Labs     03/13/13  0300  WBC  17.2*  HGB  10.9*  HCT  33.0*  PLT  264   Coag's Recent Labs     03/11/13  0615  03/12/13  0357  03/13/13  0300  INR  1.94*  1.92*  2.09*   BMET Recent Labs     03/11/13  0615  03/12/13  1400  03/13/13  0300  NA  130*  132*  136  K  5.1  5.7*  5.7*  CL  94*  95*  97  CO2  27  29  30   BUN  99*  91*  80*  CREATININE  2.15*  1.67*  1.51*  GLUCOSE  99  187*  179*   Electrolytes Recent Labs     03/11/13  0615  03/12/13  1400  03/13/13  0300  CALCIUM  8.4  8.6  9.0   Sepsis Markers No results found for this basename: LACTICACIDVEN, PROCALCITON, O2SATVEN,  in the last 72 hours ABG Recent Labs  03/13/13  0108  03/13/13  0750  PHART  7.330*  7.322*  PCO2ART  56.7*  60.7*  PO2ART  65.8*  101.0*   Liver Enzymes No results found for this basename: AST, ALT, ALKPHOS, BILITOT, ALBUMIN,  in the last 72 hours Cardiac Enzymes Recent Labs     03/13/13  0300  TROPONINI  <0.30  PROBNP  4437.0*   Glucose Recent Labs     03/11/13  2123  03/12/13  0556  03/12/13  1122  03/12/13  1629  03/12/13  2119  03/13/13  0746  GLUCAP  133*  113*  161*  241*  190*  172*    Imaging Dg Chest Port 1 View  03/13/2013   CLINICAL DATA:  Hypoxia  EXAM: PORTABLE CHEST - 1 VIEW  COMPARISON:  03/07/2013  FINDINGS: Previous median  sternotomy. Left subclavian AICD stable. Moderate cardiomegaly stable. New extensive interstitial and airspace edema or infiltrates, right worse than left. Possible layering pleural effusions.  IMPRESSION: 1. Significant worsening of asymmetric edema or infiltrates, right greater than left.   Electronically Signed   By: Oley Balm M.D.   On: 03/13/2013 01:35     CXR:  Diffuse bilateral airspace disease   ASSESSMENT / PLAN:  PULMONARY A: Acute on chronic respiratory failure in setting of pulmonary edema +/- PNA/HCAP Decompensated OHS/OSA P:   Full vent support Diurese  See ID section    CARDIOVASCULAR A:  Decompensated systolic (EF 35-40%) and diastolic heart failure Pulmonary edema  CAF (on coumadin) P:  Intubate/ventilate  Pre-load reduction w/ Diuresis  Further consideration for afterload reduction as BP stabilizes Heparin per pharmacy   RENAL A:   Stage III CKD-->baseline scr 1.6-->1.7 Hyperkalemia -->s/p Kayexalate   P:   Cont lasix Stop any KCL supplementation  Trend chem Keep as dry as creatinine will allow   GASTROINTESTINAL A:   Obesity  P:   PPI Place FT Start tube feeds   HEMATOLOGIC A:   Anemia Therapeutic anticoagulation  P:  Cont warfarin  ppi Trend cbc   INFECTIOUS A:   R/o HCAP vs aspiration >not convinced that she actually has PNA. Favor heart failure   P:   Cont current abx for now Start procalcitonin algorithm   ENDOCRINE A:   DM w/ hyperglycemia Hypothyroidism  P:   ssi Cont thyroid replacement   NEUROLOGIC A:   Acute encephalopathy >mix of hypercarbia and hypoxia  P:   Supportive care Sedation protocol  TODAY'S SUMMARY:  Increased resp failure, failing NIPPV. Will intubate, cont diuresis, move to ICU.    Reviewed above, examined pt, and agree with assessment/plan.  CC time 60 minutes.  72 yo female admitted with fever, delirium and dyspnea.  Concern for cellulitis, acute systolic heart failure, and  pneumonia.  Has hx of sleep disordered breathing.  Developed progressive encephalopathy with hypercapnia >> not much improvement with NiPPV.  Pt intubated and transferred to ICU.  Will continue Abx for cellulitis and possible HCAP.  Continue diuresis.    Coralyn Helling, MD [facility] Pulmonary/Critical Care 03/13/2013, 10:03 AM Pager:  [phone] After 3pm call: [phone]

## 2013-03-13 NOTE — Progress Notes (Signed)
PT/OT  Cancellation and D/C Note  Patient Details Name: Destiny [name] MRN: [id number] DOB: 1940/09/10   Cancelled Treatment:    Reason Eval/Treat Not Completed: Medical issues which prohibited therapy .  Pt intubated early this am.  MD d/c'd therapy per orders.  Will await new orders prior to continuing PT.     INGOLD,[place] Agent 03/13/2013, 11:16 AM [facility] [phone] (pager)

## 2013-03-13 NOTE — Progress Notes (Signed)
ANTIBIOTIC CONSULT NOTE - INITIAL  Pharmacy Consult for Vancomycin per Rx (Zosyn per MD) Indication: pneumonia  Allergies  Allergen Reactions  . Sulfonamide Derivatives Other (See Comments)    Unknown; "childhood allergy/mother"    Patient Measurements: Height: 5\' 4"  (162.6 cm) Weight: 277 lb 6.4 oz (125.828 kg) (scale c) IBW/kg (Calculated) : 54.7  Vital Signs: Temp: 100.5 F (38.1 C) (10/31 0158) Temp src: Axillary (10/31 0158) BP: 214/67 mmHg (10/31 0158) Pulse Rate: 88 (10/31 0158) Intake/Output from previous day: 10/30 0701 - 10/31 0700 In: 960 [P.O.:960] Out: 951 [Urine:950; Stool:1] Intake/Output from this shift: Total I/O In: 240 [P.O.:240] Out: 350 [Urine:350]  Labs:  Recent Labs  03/10/13 0557 03/11/13 0615 03/12/13 1400  CREATININE 2.50* 2.15* 1.67*   Estimated Creatinine Clearance: 41.1 ml/min (by C-G formula based on Cr of 1.67). No results found for this basename: VANCOTROUGH, Leodis Binet, VANCORANDOM, GENTTROUGH, GENTPEAK, GENTRANDOM, TOBRATROUGH, TOBRAPEAK, TOBRARND, AMIKACINPEAK, AMIKACINTROU, AMIKACIN,  in the last 72 hours   Microbiology: Recent Results (from the past 720 hour(s))  MRSA PCR SCREENING     Status: None   Collection Time    03/05/13  8:35 PM      Result Value Range Status   MRSA by PCR NEGATIVE  NEGATIVE Final   Comment:            The GeneXpert MRSA Assay (FDA     approved for NASAL specimens     only), is one component of a     comprehensive MRSA colonization     surveillance program. It is not     intended to diagnose MRSA     infection nor to guide or     monitor treatment for     MRSA infections.  URINE CULTURE     Status: None   Collection Time    03/10/13 12:33 PM      Result Value Range Status   Specimen Description URINE, CLEAN CATCH   Final   Special Requests NONE   Final   Culture  Setup Time     Final   Value: 03/10/2013 17:08     Performed at [facility] Count     Final   Value: 2,000  COLONIES/ML     Performed at [facility]   Culture     Final   Value: INSIGNIFICANT GROWTH     Performed at [facility]   Report Status 03/11/2013 FINAL   Final    Medical History: Past Medical History  Diagnosis Date  . LBBB (left bundle branch block)   . Carotid bruit     a. 10/19/2011 carotid duplex - Mild hard plaque bilaterally. Stable 40-59% bilateral ICA stenosis.   Marland Kitchen Dyslipidemia   . Hypertension   . Chronic combined systolic and diastolic CHF (congestive heart failure)     a. EF as low as 25% in 2006;  b. EF 60-65% in 12/2011;  c. 02/2013 Echo: EF 35-40, mod mid-dist antsept HK, Gr 2 DD, mild LVH.  . Back pain, chronic     "just when I walk; mass on 3rd and 4th vertebrae right lower back"  . Cardiomyopathy, ischemic     a. 2012 s/p SJM 3231-40 Uni BiV ICD, ser # [id number].  Marland Kitchen CAD (coronary artery disease)     a. 05/2001 CABG x4: LIMA->LAD, VG->D1, VG->D2, VG->RCA.  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 CKD (chronic kidney disease), stage III   .  Biventricular implantable cardiac defibrillator in situ 2007, 2012    a. 2007;  b. 2012 Gen change: SJM 3231-40 Uni BiV ICD, ser # [id number].  Marland Kitchen Hypoxemia requiring supplemental oxygen   . Candidiasis of vulva and vagina   . Hypothyroidism   . 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 Truett Perna, in remission  . History of pneumonia 12/27/11    "3 times; it's been a long time ago"  . OSA on CPAP   . Gout ~ 08/2011  . PAF (paroxysmal atrial fibrillation)     on Coumadin  . DM (diabetes mellitus), type 2 with complications     insulin dependent, retinopathy, neuropathy  . Mural thrombus of left ventricle     before 2003, while on Coumadin, No h/o CVA  . Morbid obesity with BMI of 45.0-49.9, adult     Ht. 5'4". BMI 47.2  . Vitamin D deficiency 06/09/2012    historical  . Olecranon bursitis of right elbow 12/29/2012  . History of recurrent UTIs      Assessment:  72 y/o F with increasing oxygen requirements being tx to stepdown to start vancomycin per Rx, Zosyn per MD. CXR with new interstitial and airspace edema. Labs as above.   10/31 Vanco>> 10/31 Zosyn>> 10/28 Levaquin>>  Goal of Therapy:  Vancomycin trough level 15-20 mcg/ml  Plan:  -Vancomycin 2000 mg IV x 1 load -Vancomycin 1500 mg IV q24h -Zosyn per MD -Levaquin per previous note -Trend WBC, temp, renal function, CXR -DC Levaquin?  Thank you for allowing me to take part in this patient's care,  Abran Duke, PharmD Clinical Pharmacist Phone: [phone] Pager: [phone] 03/13/2013 2:28 AM

## 2013-03-13 NOTE — Procedures (Signed)
Intubation Procedure Note Destiny [name] [id number] 1940/09/24  Procedure: Intubation Indications: Respiratory insufficiency  Procedure Details Consent: Risks of procedure as well as the alternatives and risks of each were explained to the (patient/caregiver).  Consent for procedure obtained. Time Out: Verified patient identification, verified procedure, site/side was marked, verified correct patient position, special equipment/implants available, medications/allergies/relevent history reviewed, required imaging and test results available.  Performed  Maximum sterile technique was used including cap, gloves, hand hygiene and mask.  MAC #4 glide scope    Evaluation Hemodynamic Status: BP stable throughout; O2 sats: stable throughout Patient's Current Condition: stable Complications: No apparent complications Patient did tolerate procedure well. Chest X-ray ordered to verify placement.  CXR: pending.   BABCOCK,PETE 03/13/2013  Coralyn Helling, MD [place] Pulmonary/Critical Care 03/13/2013, 9:16 AM Pager:  [phone] After 3pm call: [phone]

## 2013-03-13 NOTE — Progress Notes (Signed)
S: Pt examined and chart reviewed. Patient began to feel more short of breath on 2L [place] around 12:30, sats in the 80s. She became confused and was talking about "5 babies that I need to take care of." She was placed on her home CPAP at 4L and sats improved to upper 90s although she was very anxious as she was not used to wearing a full face mask. Her shortness of breath did improve. Sats then declined again and it was decided to move her to SDU for BiPAP.  O: VS reviewed, RR in 30s, lungs clear but diminished in bases. Heart RRR. No LE edema. CXR on 10/25 shows edema. Labs reviewed.  A/P: Hypoxia, dyspnea: questionably related to flash pulmonary edema vs worsening pneumonia. Obtain ABG, BiPAP, transfer to SDU. Repeat CXR. Labs including BNP. Will make CCM aware in case of decline. Additional dose of lasix now and foley. Upon arrival to SDU patient had low grade fever 100.4, raises possibility of worsening pneumonia. Will add vanc and zosyn, has already been on levaquin.

## 2013-03-13 NOTE — Progress Notes (Signed)
Patient being transferred to higher level of care per MD order for desating.  Report given to [facility] nurse.  RN to transport patient. Louretta Parma, RN

## 2013-03-13 NOTE — Progress Notes (Signed)
Around 0800 Dr.Sood at bedside seen the ABG result, talked to the family and ordered for intubation.RT was made aware .ETT was inserted and well tolerated , vital signs stable. Dr. Robb Matar was paged and  Pt was  transferred to 2M14.

## 2013-03-13 NOTE — Progress Notes (Signed)
OT Cancellation Note  Patient Details Name: Destiny [name] MRN: [id number] DOB: 02-Feb-1941   Cancelled Treatment:    Reason Eval/Treat Not Completed: Medical issues which prohibited therapy - Pt now intubated and sedated.  MD has discontinued orders.  Will await OT reorders.  Jeani Hawking M [phone] 03/13/2013, 11:22 AM

## 2013-03-13 NOTE — Progress Notes (Signed)
INITIAL NUTRITION ASSESSMENT  DOCUMENTATION CODES Per approved criteria  -Morbid Obesity   INTERVENTION:  Initiate TF via OGT with Osmolite 1.5 at 15 ml/h, goal rate, with Prostat 60 ml QID to provide 1340 kcals (24 kcals/kg ideal weight), 143 gm protein, 274 ml free water daily.  NUTRITION DIAGNOSIS: Inadequate oral intake related to inability to eat as evidenced by NPO status.   Goal: Enteral nutrition to provide 60-70% of estimated calorie needs (22-25 kcals/kg ideal body weight) and 100% of estimated protein needs, based on ASPEN guidelines for permissive underfeeding in critically ill obese individuals.  Monitor:  TF tolerance/adequacy, weight trend, labs, vent status.  Reason for Assessment: MD Consult for TF initiation and management.  72 y.o. female  Admitting Dx: Acute on chronic combined systolic and diastolic heart failure  ASSESSMENT: Patient was admitted on 10/23 with what was felt to be decompensated heart failure, +/- bronchitis and cellulitis. Initially got better from symptom stand-point. On 10/31 developed increased fever, increased work of breathing and delirium. Required intubation on 10/31. Nutrition focused physical exam completed.  No muscle or subcutaneous fat depletion noticed. Since admission, patient has been consuming 100% of CHO-modified meals.  Patient is currently intubated on ventilator support.  MV: 7.6 L/min Temp:Temp (24hrs), Avg:99.2 F (37.3 C), Min:97.8 F (36.6 C), Max:100.5 F (38.1 C)   Height: Ht Readings from Last 1 Encounters:  03/09/13 5\' 4"  (1.626 m)    Weight: Wt Readings from Last 1 Encounters:  03/13/13 267 lb 10.2 oz (121.4 kg)    Ideal Body Weight: 54.5 kg  % Ideal Body Weight: 223%  Wt Readings from Last 10 Encounters:  03/13/13 267 lb 10.2 oz (121.4 kg)  01/15/13 272 lb 12.8 oz (123.741 kg)  12/26/12 273 lb (123.832 kg)  10/02/12 270 lb 4 oz (122.585 kg)  09/25/12 268 lb 3.2 oz (121.655 kg)  09/10/12 275 lb  (124.739 kg)  07/14/12 269 lb 6.4 oz (122.199 kg)  06/09/12 271 lb 1.9 oz (122.979 kg)  02/13/12 269 lb 8 oz (122.244 kg)  02/12/12 268 lb (121.564 kg)    Usual Body Weight: 268-270 lb  % Usual Body Weight: 100%  BMI:  Body mass index is 45.92 kg/(m^2). class 3, extreme/morbid obesity  Estimated Nutritional Needs: Kcal: 1865 Protein: 136 gm Fluid: 1.9-2 L  Skin: no problems  Diet Order: NPO  EDUCATION NEEDS: -Education not appropriate at this time   Intake/Output Summary (Last 24 hours) at 03/13/13 1022 Last data filed at 03/13/13 0528  Gross per 24 hour  Intake   1150 ml  Output   1501 ml  Net   -351 ml    Last BM: 10/30   Labs:   Recent Labs Lab 03/11/13 0615 03/12/13 1400 03/13/13 0300  NA 130* 132* 136  K 5.1 5.7* 5.7*  CL 94* 95* 97  CO2 27 29 30   BUN 99* 91* 80*  CREATININE 2.15* 1.67* 1.51*  CALCIUM 8.4 8.6 9.0  GLUCOSE 99 187* 179*    CBG (last 3)   Recent Labs  03/12/13 2119 03/13/13 0746 03/13/13 0849  GLUCAP 190* 172* 187*    Scheduled Meds: . antiseptic oral rinse  15 mL Mouth Rinse QID  . aspirin EC  81 mg Oral Daily  . carvedilol  12.5 mg Per Tube BID WC  . ceFEPime (MAXIPIME) IV  1 g Intravenous Q12H  . chlorhexidine  15 mL Mouth Rinse BID  . furosemide  40 mg Intravenous BID  . insulin aspart  0-20  Units Subcutaneous Q4H  . [START ON 03/14/2013] levofloxacin (LEVAQUIN) IV  500 mg Intravenous Q48H  . [START ON 03/14/2013] levothyroxine  25 mcg Per Tube QAC breakfast  . sodium chloride  3 mL Intravenous Q12H  . sodium polystyrene  30 g Rectal Once  . sodium polystyrene  30 g Oral Once  . [START ON 03/14/2013] vancomycin  1,500 mg Intravenous Q24H    Continuous Infusions: . fentaNYL infusion INTRAVENOUS 100 mcg/hr (03/13/13 0933)  . midazolam (VERSED) infusion 2 mg/hr (03/13/13 1014)    Past Medical History  Diagnosis Date  . LBBB (left bundle branch block)   . Carotid bruit     a. 10/19/2011 carotid duplex - Mild hard  plaque bilaterally. Stable 40-59% bilateral ICA stenosis.   Marland Kitchen Dyslipidemia   . Hypertension   . Chronic combined systolic and diastolic CHF (congestive heart failure)     a. EF as low as 25% in 2006;  b. EF 60-65% in 12/2011;  c. 02/2013 Echo: EF 35-40, mod mid-dist antsept HK, Gr 2 DD, mild LVH.  . Back pain, chronic     "just when I walk; mass on 3rd and 4th vertebrae right lower back"  . Cardiomyopathy, ischemic     a. 2012 s/p SJM 3231-40 Uni BiV ICD, ser # [id number].  Marland Kitchen CAD (coronary artery disease)     a. 05/2001 CABG x4: LIMA->LAD, VG->D1, VG->D2, VG->RCA.  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 CKD (chronic kidney disease), stage III   . Biventricular implantable cardiac defibrillator in situ 2007, 2012    a. 2007;  b. 2012 Gen change: SJM 3231-40 Uni BiV ICD, ser # [id number].  Marland Kitchen Hypoxemia requiring supplemental oxygen   . Candidiasis of vulva and vagina   . Hypothyroidism   . 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 Truett Perna, in remission  . History of pneumonia 12/27/11    "3 times; it's been a long time ago"  . OSA on CPAP   . Gout ~ 08/2011  . PAF (paroxysmal atrial fibrillation)     on Coumadin  . DM (diabetes mellitus), type 2 with complications     insulin dependent, retinopathy, neuropathy  . Mural thrombus of left ventricle     before 2003, while on Coumadin, No h/o CVA  . Morbid obesity with BMI of 45.0-49.9, adult     Ht. 5'4". BMI 47.2  . Vitamin D deficiency 06/09/2012    historical  . Olecranon bursitis of right elbow 12/29/2012  . History of recurrent UTIs     Past Surgical History  Procedure Laterality Date  . Cardiac catheterization  06/03/01  . Cholecystectomy  1970  . Tubal ligation  1972  . Abdominal hysterectomy  1982  . Insert / replace / remove pacemaker  2007; 2012    w/AICD  . Coronary artery bypass graft  2003    CABG X4  . Cataract extraction      left eye    Joaquin Courts,  RD, LDN, CNSC Pager [phone] After Hours Pager [phone]

## 2013-03-14 ENCOUNTER — Inpatient Hospital Stay (HOSPITAL_COMMUNITY): Payer: Medicare Other

## 2013-03-14 DIAGNOSIS — J9600 Acute respiratory failure, unspecified whether with hypoxia or hypercapnia: Secondary | ICD-10-CM

## 2013-03-14 LAB — POCT I-STAT 3, ART BLOOD GAS (G3+)
Acid-Base Excess: 6 mmol/L — ABNORMAL HIGH (ref 0.0–2.0)
Bicarbonate: 33 mEq/L — ABNORMAL HIGH (ref 20.0–24.0)
Bicarbonate: 33.6 mEq/L — ABNORMAL HIGH (ref 20.0–24.0)
O2 Saturation: 94 %
O2 Saturation: 96 %
Patient temperature: 98.6
TCO2: 35 mmol/L (ref 0–100)
pCO2 arterial: 59.7 mmHg (ref 35.0–45.0)
pCO2 arterial: 63 mmHg (ref 35.0–45.0)
pH, Arterial: 7.35 (ref 7.350–7.450)
pO2, Arterial: 80 mmHg (ref 80.0–100.0)
pO2, Arterial: 93 mmHg (ref 80.0–100.0)

## 2013-03-14 LAB — BASIC METABOLIC PANEL
BUN: 84 mg/dL — ABNORMAL HIGH (ref 6–23)
Chloride: 101 mEq/L (ref 96–112)
GFR calc Af Amer: 38 mL/min — ABNORMAL LOW (ref >90)
GFR calc non Af Amer: 33 mL/min — ABNORMAL LOW (ref >90)
Glucose, Bld: 247 mg/dL — ABNORMAL HIGH (ref 70–99)
Potassium: 4.3 mEq/L (ref 3.5–5.1)
Sodium: 141 mEq/L (ref 135–145)

## 2013-03-14 LAB — CBC
Hemoglobin: 9 g/dL — ABNORMAL LOW (ref 12.0–15.0)
MCH: 29.8 pg (ref 26.0–34.0)
MCHC: 31.8 g/dL (ref 30.0–36.0)
Platelets: 210 10*3/uL (ref 150–400)
WBC: 13.1 10*3/uL — ABNORMAL HIGH (ref 4.0–10.5)

## 2013-03-14 LAB — GLUCOSE, CAPILLARY
Glucose-Capillary: 159 mg/dL — ABNORMAL HIGH (ref 70–99)
Glucose-Capillary: 224 mg/dL — ABNORMAL HIGH (ref 70–99)
Glucose-Capillary: 224 mg/dL — ABNORMAL HIGH (ref 70–99)
Glucose-Capillary: 229 mg/dL — ABNORMAL HIGH (ref 70–99)

## 2013-03-14 LAB — PROCALCITONIN: Procalcitonin: 0.11 ng/mL

## 2013-03-14 LAB — URINE CULTURE
Culture: NO GROWTH
Special Requests: NORMAL

## 2013-03-14 LAB — PROTIME-INR
INR: 2.52 — ABNORMAL HIGH (ref 0.00–1.49)
Prothrombin Time: 26.3 seconds — ABNORMAL HIGH (ref 11.6–15.2)

## 2013-03-14 LAB — TROPONIN I: Troponin I: <0.3 ng/mL (ref <0.30)

## 2013-03-14 MED ORDER — PROPOFOL 10 MG/ML IV BOLUS
100.0000 mg | Freq: Once | INTRAVENOUS | Status: AC
  Administered 2013-03-14: 100 mg via INTRAVENOUS

## 2013-03-14 MED ORDER — FENTANYL CITRATE 0.05 MG/ML IJ SOLN
150.0000 ug | Freq: Once | INTRAMUSCULAR | Status: AC
  Administered 2013-03-14: 150 ug via INTRAVENOUS

## 2013-03-14 MED ORDER — WARFARIN - PHARMACIST DOSING INPATIENT
Freq: Every day | Status: DC
  Administered 2013-03-15: 18:00:00

## 2013-03-14 MED ORDER — FENTANYL CITRATE 0.05 MG/ML IJ SOLN
INTRAMUSCULAR | Status: AC
  Filled 2013-03-14: qty 4

## 2013-03-14 MED ORDER — FUROSEMIDE 10 MG/ML IJ SOLN
60.0000 mg | Freq: Two times a day (BID) | INTRAMUSCULAR | Status: DC
  Administered 2013-03-14 – 2013-03-16 (×4): 60 mg via INTRAVENOUS
  Filled 2013-03-14 (×7): qty 6

## 2013-03-14 MED ORDER — HALOPERIDOL LACTATE 5 MG/ML IJ SOLN
10.0000 mg | Freq: Once | INTRAMUSCULAR | Status: AC
  Administered 2013-03-14: 10 mg via INTRAVENOUS
  Filled 2013-03-14: qty 2

## 2013-03-14 MED ORDER — WARFARIN SODIUM 2.5 MG PO TABS
2.5000 mg | ORAL_TABLET | Freq: Once | ORAL | Status: AC
  Administered 2013-03-14: 2.5 mg via ORAL
  Filled 2013-03-14: qty 1

## 2013-03-14 MED ORDER — SUCCINYLCHOLINE CHLORIDE 20 MG/ML IJ SOLN
100.0000 mg | Freq: Once | INTRAMUSCULAR | Status: AC
  Administered 2013-03-14: 100 mg via INTRAVENOUS

## 2013-03-14 MED ORDER — PROPOFOL 10 MG/ML IV BOLUS
INTRAVENOUS | Status: AC
  Filled 2013-03-14: qty 20

## 2013-03-14 MED ORDER — HALOPERIDOL LACTATE 5 MG/ML IJ SOLN
5.0000 mg | Freq: Once | INTRAMUSCULAR | Status: AC
  Administered 2013-03-14: 5 mg via INTRAVENOUS
  Filled 2013-03-14: qty 1

## 2013-03-14 MED ORDER — PROPOFOL 10 MG/ML IV BOLUS: 10.0000 mg | Freq: Once | INTRAVENOUS | Status: DC

## 2013-03-14 NOTE — Procedures (Signed)
Intubation Procedure Note Destiny [name] [id number] 13-May-1941  Procedure: Intubation Indications: Respiratory insufficiency  Procedure Details Consent: Unable to obtain consent because of emergent medical necessity. Time Out: Verified patient identification, verified procedure, site/side was marked, verified correct patient position, special equipment/implants available, medications/allergies/relevent history reviewed, required imaging and test results available.  Performed  Medications used: fentanyl and succinylcholine Equipment used: Glidscope blade 3 Grade I View Correct placement confirmed by by auscultation, by CXR and ETCO2 monitor Tube secured at 23 cm at the lip  Evaluation Hemodynamic Status: BP stable throughout; O2 sats: stable throughout Patient's Current Condition: stable Complications: No apparent complications Patient did tolerate procedure well. Chest X-ray ordered to verify placement.  CXR: tube position acceptable.   Overton Mam, M.D. Pulmonary and Critical Care Medicine Call E-link with questions [phone] 03/14/2013

## 2013-03-14 NOTE — Procedures (Signed)
Intubation Procedure Note Destiny [name] [id number] January 28, 1941  Procedure: Intubation Indications: Respiratory insufficiency  Procedure Details Consent: Unable to obtain consent because of altered level of consciousness. Time Out: Verified patient identification, verified procedure, site/side was marked, verified correct patient position, special equipment/implants available, medications/allergies/relevent history reviewed, required imaging and test results available.  Performed  Maximum sterile technique was used including antiseptics, cap, gloves, gown, hand hygiene, mask and sheet.  MAC and 3    Evaluation Hemodynamic Status: BP stable throughout; O2 sats: stable throughout Patient's Current Condition: stable Complications: No apparent complications Patient did tolerate procedure well. Chest X-ray ordered to verify placement.  CXR: pending.  Patient previously self extubated.  Was reintubated due to increased WOB and elevating CO2 levels.  Sats 96%.  Positive bilateral breath sound.  Good color change with entitle.  RT will continue to monitor. Durwin Glaze 03/14/2013

## 2013-03-14 NOTE — Progress Notes (Signed)
ANTICOAGULATION CONSULT NOTE -  Pharmacy Consult for coumadin Indication: afib  Allergies  Allergen Reactions  . Sulfonamide Derivatives Other (See Comments)    Unknown; "childhood allergy/mother"    Patient Measurements: Height: 5\' 4"  (162.6 cm) Weight: 272 lb 14.9 oz (123.8 kg) IBW/kg (Calculated) : 54.7 Heparin Dosing Weight: 84.5kg  Vital Signs: Temp: 98.3 F (36.8 C) (11/01 1121) Temp src: Oral (11/01 1121) BP: 175/62 mmHg (11/01 1300) Pulse Rate: 104 (11/01 1300)  Labs:  Recent Labs  03/12/13 0357  03/13/13 0300 03/13/13 0900 03/13/13 1540 03/14/13 0137 03/14/13 0455  HGB  --   --  10.9*  --   --   --  9.0*  HCT  --   --  33.0*  --   --   --  28.3*  PLT  --   --  264  --   --   --  210  LABPROT 21.4*  --  22.8*  --   --   --  26.3*  INR 1.92*  --  2.09*  --   --   --  2.52*  CREATININE  --   < > 1.51*  --  1.63*  --  1.56*  TROPONINI  --   --  <0.30 <0.30  --  <0.30  --   < > = values in this interval not displayed.  Estimated Creatinine Clearance: 43.6 ml/min (by C-G formula based on Cr of 1.56).  Medications:  Scheduled:  . antiseptic oral rinse  15 mL Mouth Rinse QID  . aspirin EC  81 mg Oral Daily  . carvedilol  12.5 mg Per Tube BID WC  . ceFEPime (MAXIPIME) IV  1 g Intravenous Q12H  . chlorhexidine  15 mL Mouth Rinse BID  . feeding supplement (OSMOLITE 1.5 CAL)  1,000 mL Per Tube Q24H  . feeding supplement (PRO-STAT SUGAR FREE 64)  60 mL Per Tube QID  . fentaNYL      . furosemide  60 mg Intravenous BID  . insulin aspart  0-20 Units Subcutaneous Q4H  . levofloxacin (LEVAQUIN) IV  500 mg Intravenous Q48H  . levothyroxine  25 mcg Per Tube QAC breakfast  . multivitamin  5 mL Per Tube Daily  . pantoprazole sodium  40 mg Per Tube Q1200  . propofol      . sodium chloride  3 mL Intravenous Q12H  . sodium polystyrene  30 g Oral Once  . vancomycin  1,500 mg Intravenous Q24H    Assessment: 72 yo female here with HF on coumadin PTA for afib (also  history of mural thrombus).  Patient has developed respiratory failure and now intubated. Heparin infusion was ordered (to begin when INR < 2) and now for plans to resume coumadin.  INR today = 2.52 (trend up with last dose on 03/12/13). Patient now on antibiotics for PNA and anticipate increased coumadin sensitivity.  Home coumadin regimen: 7.5 TTHSS, 5mg  MWF  Goal of Therapy:  INR= 2-3 Monitor platelets by anticoagulation protocol: Yes   Plan:  -Coumadin 2.5mg  today - Daily PT/INR  Harland German, Pharm D 03/14/2013 1:53 PM

## 2013-03-14 NOTE — Consult Note (Signed)
PULMONARY  / CRITICAL CARE MEDICINE  Name: Destiny [name] MRN: [id number] DOB: 1941-04-28    ADMISSION DATE:  03/05/2013 CONSULTATION DATE:  10/31  REFERRING MD : Robb Matar  PRIMARY SERVICE: [facility]-->PCCM   CHIEF COMPLAINT:  Acute resp failure   BRIEF PATIENT DESCRIPTION:  68 yof w/ mult co-morbids: AF, CHF (EF 35-40%), obesity, OSA. Admitted 10/23 w/ what was felt to be decompensated heart failure, +/- bronchitis and cellulitis. Initially got better from symptom stand-point. On 10/31 developed increased fever, increased work of breathing and delirium. PCCM asked to see for progressive respiratory failure.   SIGNIFICANT EVENTS / STUDIES:  ECHO 10/24: EF 35-40% gd II diastolic dysfxn 10/31- self extubation, reintubated  LINES / TUBES: OETT 1031>>>seld ext ETT>>> left IJ 10/31>>>  CULTURES: Respiratory culture 10/31>>> BCX2 10/31>>> UC 10/31>>>  ANTIBIOTICS: levaquin 10/30>> Zosyn 10/30>>> vanc 10/30>>>   SUBJECTIVE:  retubed  VITAL SIGNS: Temp:  [98.5 F (36.9 C)-99.7 F (37.6 C)] 98.7 F (37.1 C) (11/01 0742) Pulse Rate:  [64-122] 79 (11/01 0900) Resp:  [18-28] 20 (11/01 0900) BP: (112-174)/(40-108) 138/47 mmHg (11/01 0900) SpO2:  [91 %-100 %] 99 % (11/01 0900) FiO2 (%):  [60 %-100 %] 60 % (11/01 0900) Weight:  [123.8 kg (272 lb 14.9 oz)-126 kg (277 lb 12.5 oz)] 123.8 kg (272 lb 14.9 oz) (11/01 0400) HEMODYNAMICS:   VENTILATOR SETTINGS: Vent Mode:  [-] PRVC FiO2 (%):  [60 %-100 %] 60 % Set Rate:  [20 bmp] 20 bmp Vt Set:  [400 mL-440 mL] 440 mL PEEP:  [5 cmH20-10 cmH20] 5 cmH20 Plateau Pressure:  [21 cmH20-28 cmH20] 27 cmH20 INTAKE / OUTPUT: Intake/Output     10/31 0701 - 11/01 0700 11/01 0701 - 11/02 0700   P.O.     I.V. (mL/kg) 194.9 (1.6)    NG/GT 463.8    IV Piggyback 600    Total Intake(mL/kg) 1258.6 (10.2)    Urine (mL/kg/hr) 1800 (0.6) 400 (1)   Stool     Total Output 1800 400   Net -541.4 -400        Stool Occurrence 2 x      PHYSICAL  EXAMINATION: General:  Morbidly obese white female, calm Neuro:  rass -3 HEENT:  Neck large, unable to assess JVD Cardiovascular:  Regular irregular  Lungs:  Reduced, coarse Abdomen:  Obese + bowel sounds  Musculoskeletal:  intact Skin:  Chronic LE swelling 2+ pulses   LABS:  CBC Recent Labs     03/13/13  0300  03/14/13  0455  WBC  17.2*  13.1*  HGB  10.9*  9.0*  HCT  33.0*  28.3*  PLT  264  210   Coag's Recent Labs     03/12/13  0357  03/13/13  0300  03/14/13  0455  INR  1.92*  2.09*  2.52*   BMET Recent Labs     03/13/13  0300  03/13/13  1540  03/14/13  0455  NA  136  138  141  K  5.7*  4.6  4.3  CL  97  98  101  CO2  30  30  31   BUN  80*  82*  84*  CREATININE  1.51*  1.63*  1.56*  GLUCOSE  179*  214*  247*   Electrolytes Recent Labs     03/13/13  0300  03/13/13  1540  03/14/13  0455  CALCIUM  9.0  8.5  8.4   Sepsis Markers Recent Labs     03/13/13  0900  03/14/13  0455  PROCALCITON  0.14  0.11   ABG Recent Labs     03/13/13  1020  03/14/13  0029  03/14/13  0340  PHART  7.270*  7.335*  7.350  PCO2ART  70.4*  63.0*  59.7*  PO2ART  84.0  80.0  93.0   Liver Enzymes No results found for this basename: AST, ALT, ALKPHOS, BILITOT, ALBUMIN,  in the last 72 hours Cardiac Enzymes Recent Labs     03/13/13  0300  03/13/13  0900  03/14/13  0137  TROPONINI  <0.30  <0.30  <0.30  PROBNP  4437.0*   --    --    Glucose Recent Labs     03/13/13  1235  03/13/13  1556  03/13/13  2014  03/14/13  0023  03/14/13  0357  03/14/13  0735  GLUCAP  191*  189*  227*  229*  248*  198*    Imaging Dg Abd 1 View  03/13/2013   CLINICAL DATA:  OG tube placement  EXAM: ABDOMEN - 1 VIEW  COMPARISON:  None.  FINDINGS: Enteric tube terminates in the antropyloric region, likely within the proximal duodenum.  Nonobstructive bowel gas pattern.  ICD leads, incompletely visualized.  IMPRESSION: Enteric tube terminates in the antropyloric region, likely within the  proximal duodenum.   Electronically Signed   By: Charline Bills M.D.   On: 03/13/2013 12:55   Dg Chest Port 1 View  03/14/2013   CLINICAL DATA:  ETT placement.  EXAM: PORTABLE CHEST - 1 VIEW  COMPARISON:  Chest x-ray from yesterday.  FINDINGS: Endotracheal tube ends in the mid thoracic trachea. Left IJ central venous catheter in similar position, likely in the left brachiocephalic vein. Stable positioning of biventricular pacer wires.  Pulmonary edema with perihilar opacities likely representing alveolar edema, increased from prior. Cardiomegaly. The heart is partly seen. No increasing pleural fluid. No pneumothorax.  IMPRESSION: 1. Endotracheal tube in good position. 2. Pulmonary edema which has likely increased since yesterday. 3. Patchy lung opacities which could be alveolar edema or pneumonia.   Electronically Signed   By: Tiburcio Pea M.D.   On: 03/14/2013 03:17   Dg Chest Port 1 View  03/13/2013   CLINICAL DATA:  Endotracheal tube replacement. Left central line placement.  EXAM: PORTABLE CHEST - 1 VIEW  COMPARISON:  One-view chest 03/13/2013 at 1:22 a.m.  FINDINGS: The patient has been intubated. The endotracheal tube terminates 4.5 cm above the chronic, in satisfactory position. A left IJ line terminates in the mid innominate vein. Arterial positioning cannot be excluded. The NG tube courses off the inferior border the film. Pacing wires are stable.  Cardiac enlargement is again noted. Interstitial edema and bilateral atelectasis is noted. Aeration is slightly improved.  IMPRESSION: 1. Satisfactory positioning of the endotracheal tube. 2. The left IJ line likely terminates in the innominate vein. 3. Persistent low lung volumes with improved aeration. 4. Stable cardiomegaly with some persistent pulmonary vascular congestion and bibasilar atelectasis.   Electronically Signed   By: Gennette Pac M.D.   On: 03/13/2013 10:50   Dg Chest Port 1 View  03/13/2013   CLINICAL DATA:  Hypoxia  EXAM:  PORTABLE CHEST - 1 VIEW  COMPARISON:  03/07/2013  FINDINGS: Previous median sternotomy. Left subclavian AICD stable. Moderate cardiomegaly stable. New extensive interstitial and airspace edema or infiltrates, right worse than left. Possible layering pleural effusions.  IMPRESSION: 1. Significant worsening of asymmetric edema or infiltrates, right greater than left.  Electronically Signed   By: Oley Balm M.D.   On: 03/13/2013 01:35   Dg Abd Portable 1v  03/14/2013   CLINICAL DATA:  Subsequent evaluation of nasogastric tube positioning.  EXAM: PORTABLE ABDOMEN - 1 VIEW  COMPARISON:  Portable abdomen examination yesterday.  FINDINGS: Nasogastric tube tip projects over the mid body of the stomach. Bowel gas pattern unremarkable.  IMPRESSION: 1. Nasogastric tube tip projects over the mid body of the stomach. 2. No acute abdominal abnormality.   Electronically Signed   By: Hulan Saas M.D.   On: 03/14/2013 08:30     CXR:  Diffuse bilateral airspace disease increased rt  ASSESSMENT / PLAN:  PULMONARY A: Acute on chronic respiratory failure in setting of pulmonary edema +/- PNA/HCAP Decompensated OHS/OSA P:   On 60%, if able to 50% will start cpap5 ps 5, goal 1 hr No extubation planned pcxr in am  Neg balance goals abx Maintain same MV on rest mode  CARDIOVASCULAR A:  Decompensated systolic (EF 35-40%) and diastolic heart failure Pulmonary edema  CAF (on coumadin) P:  Diuresis, may consider increase, pcxr increased 60% required, only 500 cc neg Afterload reduction Heparin per pharmacy   RENAL A:   Stage III CKD-->baseline scr 1.6-->1.7 Hyperkalemia -->s/p Kayexalate   P:   Cont lasix Am chem  GASTROINTESTINAL A:   Obesity  P:   PPI Place FT Tf to goal  HEMATOLOGIC A:   Anemia Therapeutic anticoagulation  P:  Cont warfarin per pharmacy ppi Am cbc   INFECTIOUS A:   R/o HCAP vs aspiration  P:   Cont current abx for now Start procalcitonin algorithm,  likely can narrow in am  ENDOCRINE A:   DM w/ hyperglycemia Hypothyroidism  P:   ssi Cont thyroid replacement   NEUROLOGIC A:   Acute encephalopathy >mix of hypercarbia and hypoxia  P:   Supportive care WUA  TODAY'S SUMMARY:  Increase lasix, continue abx, wean if to 50%  Ccm time 30 min   Mcarthur Rossetti. Tyson Alias, MD, FACP Pgr: [phone] [facility]

## 2013-03-14 NOTE — Progress Notes (Signed)
eLink Physician-Brief Progress Note Patient Name: Destiny [name] DOB: July 04, 1940 MRN: [id number]  Date of Service  03/14/2013   HPI/Events of Note  Patient so far stable self-extubation with ABG 7.33/63/86/33 94% but now agitated, confused.   eICU Interventions  Plan: Haldol 5 mg IV times one - nurse to call if continued issues with agitation/confusion Bilateral wrist restraints for patient safety   Intervention Category Major Interventions: Delirium, psychosis, severe agitation - evaluation and management  DETERDING,ELIZABETH 03/14/2013, 12:36 AM

## 2013-03-15 ENCOUNTER — Inpatient Hospital Stay (HOSPITAL_COMMUNITY): Payer: Medicare Other

## 2013-03-15 LAB — CBC WITH DIFFERENTIAL/PLATELET
Basophils Absolute: 0 10*3/uL (ref 0.0–0.1)
Basophils Relative: 0 % (ref 0–1)
Eosinophils Absolute: 0.2 10*3/uL (ref 0.0–0.7)
Eosinophils Relative: 2 % (ref 0–5)
Hemoglobin: 9.2 g/dL — ABNORMAL LOW (ref 12.0–15.0)
MCH: 30.2 pg (ref 26.0–34.0)
MCHC: 31.4 g/dL (ref 30.0–36.0)
MCV: 96.1 fL (ref 78.0–100.0)
Monocytes Absolute: 1.2 10*3/uL — ABNORMAL HIGH (ref 0.1–1.0)
Monocytes Relative: 9 % (ref 3–12)
Neutrophils Relative %: 78 % — ABNORMAL HIGH (ref 43–77)
Platelets: 196 10*3/uL (ref 150–400)
RDW: 15.2 % (ref 11.5–15.5)

## 2013-03-15 LAB — BASIC METABOLIC PANEL
BUN: 77 mg/dL — ABNORMAL HIGH (ref 6–23)
Calcium: 8.8 mg/dL (ref 8.4–10.5)
Creatinine, Ser: 1.31 mg/dL — ABNORMAL HIGH (ref 0.50–1.10)
GFR calc non Af Amer: 40 mL/min — ABNORMAL LOW (ref >90)
Glucose, Bld: 248 mg/dL — ABNORMAL HIGH (ref 70–99)

## 2013-03-15 LAB — GLUCOSE, CAPILLARY
Glucose-Capillary: 166 mg/dL — ABNORMAL HIGH (ref 70–99)
Glucose-Capillary: 286 mg/dL — ABNORMAL HIGH (ref 70–99)

## 2013-03-15 LAB — PROTIME-INR: Prothrombin Time: 24.1 seconds — ABNORMAL HIGH (ref 11.6–15.2)

## 2013-03-15 NOTE — Progress Notes (Signed)
ANTICOAGULATION CONSULT NOTE -  Pharmacy Consult for coumadin Indication: afib  Allergies  Allergen Reactions  . Sulfonamide Derivatives Other (See Comments)    Unknown; "childhood allergy/mother"    Patient Measurements: Height: 5\' 4"  (162.6 cm) Weight: 269 lb 13.5 oz (122.4 kg) IBW/kg (Calculated) : 54.7 Heparin Dosing Weight: 84.5kg  Vital Signs: Temp: 98.8 F (37.1 C) (11/02 1129) Temp src: Oral (11/02 1129) BP: 127/31 mmHg (11/02 1000) Pulse Rate: 74 (11/02 1137)  Labs:  Recent Labs  03/13/13 0300 03/13/13 0900 03/13/13 1540 03/14/13 0137 03/14/13 0455 03/15/13 0319  HGB 10.9*  --   --   --  9.0* 9.2*  HCT 33.0*  --   --   --  28.3* 29.3*  PLT 264  --   --   --  210 196  LABPROT 22.8*  --   --   --  26.3* 24.1*  INR 2.09*  --   --   --  2.52* 2.24*  CREATININE 1.51*  --  1.63*  --  1.56* 1.31*  TROPONINI <0.30 <0.30  --  <0.30  --   --     Estimated Creatinine Clearance: 51.6 ml/min (by C-G formula based on Cr of 1.31).  Medications:  Scheduled:  . antiseptic oral rinse  15 mL Mouth Rinse QID  . aspirin EC  81 mg Oral Daily  . carvedilol  12.5 mg Per Tube BID WC  . ceFEPime (MAXIPIME) IV  1 g Intravenous Q12H  . chlorhexidine  15 mL Mouth Rinse BID  . feeding supplement (OSMOLITE 1.5 CAL)  1,000 mL Per Tube Q24H  . feeding supplement (PRO-STAT SUGAR FREE 64)  60 mL Per Tube QID  . furosemide  60 mg Intravenous BID  . insulin aspart  0-20 Units Subcutaneous Q4H  . levofloxacin (LEVAQUIN) IV  500 mg Intravenous Q48H  . levothyroxine  25 mcg Per Tube QAC breakfast  . multivitamin  5 mL Per Tube Daily  . pantoprazole sodium  40 mg Per Tube Q1200  . sodium chloride  3 mL Intravenous Q12H  . vancomycin  1,500 mg Intravenous Q24H  . Warfarin - Pharmacist Dosing Inpatient   Does not apply q1800    Assessment: 72 yo female here with HF on coumadin PTA for afib (also history of mural thrombus).  Patient has developed respiratory failure and now intubated.  Heparin infusion was ordered (to begin when INR < 2) and now for plans to resume coumadin.  INR today = 2.24. Patient  on antibiotics for PNA and NPO and anticipate increased coumadin sensitivity. Coumadin 2.5 mg given 11/1 and dose skipped on 10/31. Of note coumadin dosing 10/24-10/30 was 7.5mg -12.5mg .  Home coumadin regimen: 7.5 TTHSS, 5mg  MWF  Goal of Therapy:  INR= 2-3 Monitor platelets by anticoagulation protocol: Yes   Plan:  -Coumadin 10 mg today - Daily PT/INR  Harland German, Pharm D 03/15/2013 11:58 AM

## 2013-03-15 NOTE — Progress Notes (Signed)
PULMONARY  / CRITICAL CARE MEDICINE  Name: Destiny [name] MRN: [id number] DOB: Mar 04, 1941    ADMISSION DATE:  03/05/2013 CONSULTATION DATE:  10/31  REFERRING MD : Robb Matar  PRIMARY SERVICE: [facility]-->PCCM   CHIEF COMPLAINT:  Acute resp failure   BRIEF PATIENT DESCRIPTION:  88 yof w/ mult co-morbids: AF, CHF (EF 35-40%), obesity, OSA. Admitted 10/23 w/ what was felt to be decompensated heart failure, +/- bronchitis and cellulitis. Initially got better from symptom stand-point. On 10/31 developed increased fever, increased work of breathing and delirium. PCCM asked to see for progressive respiratory failure.   SIGNIFICANT EVENTS / STUDIES:  ECHO 10/24: EF 35-40% gd II diastolic dysfxn 10/31- self extubation, reintubated  LINES / TUBES: OETT 1031>>>seld ext ETT>>> left IJ 10/31>>>  CULTURES: Respiratory culture 10/31>>> BCX2 10/31>>> UC 10/31>>>  ANTIBIOTICS: levaquin 10/30>> Zosyn 10/30>>> vanc 10/30>>>   SUBJECTIVE:  retubed  VITAL SIGNS: Temp:  [98.5 F (36.9 C)-99.1 F (37.3 C)] 98.8 F (37.1 C) (11/02 1129) Pulse Rate:  [67-104] 74 (11/02 1137) Resp:  [13-34] 20 (11/02 1137) BP: (101-175)/(21-106) 127/31 mmHg (11/02 1000) SpO2:  [88 %-99 %] 92 % (11/02 1137) FiO2 (%):  [40 %-60 %] 50 % (11/02 1137) Weight:  [122.4 kg (269 lb 13.5 oz)] 122.4 kg (269 lb 13.5 oz) (11/02 0312) HEMODYNAMICS:   VENTILATOR SETTINGS: Vent Mode:  [-] PRVC FiO2 (%):  [40 %-60 %] 50 % Set Rate:  [20 bmp] 20 bmp Vt Set:  [440 mL] 440 mL PEEP:  [5 cmH20] 5 cmH20 Pressure Support:  [8 cmH20] 8 cmH20 Plateau Pressure:  [20 cmH20-28 cmH20] 24 cmH20 INTAKE / OUTPUT: Intake/Output     11/01 0701 - 11/02 0700 11/02 0701 - 11/03 0700   I.V. (mL/kg) 488.6 (4) 12 (0.1)   NG/GT 574 280   IV Piggyback 700 50   Total Intake(mL/kg) 1762.6 (14.4) 342 (2.8)   Urine (mL/kg/hr) 3350 (1.1) 600 (1)   Total Output 3350 600   Net -1587.4 -258          PHYSICAL EXAMINATION:  Gen: Obese, comfortable  on vent HEENT: NCAT, ETT in place PULM: diminished in bases, otherwise clear CV: RRR, distant heart sounds AB: BS+, soft Ext: edema noted Neuro: awake, nods to questions, follows commands  LABS:  CBC Recent Labs     03/13/13  0300  03/14/13  0455  03/15/13  0319  WBC  17.2*  13.1*  12.5*  HGB  10.9*  9.0*  9.2*  HCT  33.0*  28.3*  29.3*  PLT  264  210  196   Coag's Recent Labs     03/13/13  0300  03/14/13  0455  03/15/13  0319  INR  2.09*  2.52*  2.24*   BMET Recent Labs     03/13/13  1540  03/14/13  0455  03/15/13  0319  NA  138  141  146*  K  4.6  4.3  3.8  CL  98  101  106  CO2  30  31  33*  BUN  82*  84*  77*  CREATININE  1.63*  1.56*  1.31*  GLUCOSE  214*  247*  248*   Electrolytes Recent Labs     03/13/13  1540  03/14/13  0455  03/15/13  0319  CALCIUM  8.5  8.4  8.8   Sepsis Markers Recent Labs     03/13/13  0900  03/14/13  0455  03/15/13  0319  PROCALCITON  0.14  0.11  0.12   ABG Recent Labs     03/13/13  1020  03/14/13  0029  03/14/13  0340  PHART  7.270*  7.335*  7.350  PCO2ART  70.4*  63.0*  59.7*  PO2ART  84.0  80.0  93.0   Liver Enzymes No results found for this basename: AST, ALT, ALKPHOS, BILITOT, ALBUMIN,  in the last 72 hours Cardiac Enzymes Recent Labs     03/13/13  0300  03/13/13  0900  03/14/13  0137  TROPONINI  <0.30  <0.30  <0.30  PROBNP  4437.0*   --    --    Glucose Recent Labs     03/14/13  1511  03/14/13  1945  03/15/13  0007  03/15/13  0419  03/15/13  0733  03/15/13  1125  GLUCAP  224*  224*  276*  240*  166*  218*    Imaging Dg Abd 1 View  03/13/2013   CLINICAL DATA:  OG tube placement  EXAM: ABDOMEN - 1 VIEW  COMPARISON:  None.  FINDINGS: Enteric tube terminates in the antropyloric region, likely within the proximal duodenum.  Nonobstructive bowel gas pattern.  ICD leads, incompletely visualized.  IMPRESSION: Enteric tube terminates in the antropyloric region, likely within the proximal  duodenum.   Electronically Signed   By: Charline Bills M.D.   On: 03/13/2013 12:55   Dg Chest Port 1 View  03/15/2013   CLINICAL DATA:  Pulmonary edema.  EXAM: PORTABLE CHEST - 1 VIEW  COMPARISON:  Chest radiograph March 14, 2013  FINDINGS: Cardiac silhouette appears moderately enlarged and unchanged, status post median sternotomy for apparent coronary artery bypass grafting. Moderate calcified aortic knob. Similar central pulmonary vasculature engorgement, interstitial prominence with patchy alveolar airspace opacities. Small left pleural effusion.  Endotracheal tube tip projects approximately 3 cm above the equina. Left internal jugular central venous catheter with distal tip projecting in proximal superior vena cava. Dual lead left cardiac defibrillator in situ. Multiple EKG lines overlie the patient and may obscure subtle underlying pathology. Nasogastric tube in place, distal tip not imaged at least the past the gastroesophageal junction. Remote right lateral rib fractures.  IMPRESSION: Interval placement of nasogastric tube, distal tip not imaged but at least past the gastroesophageal junction with no apparent change in remaining life support lines.  Similar appearance of chest: Cardiomegaly, pulmonary edema with patchy alveolar airspace opacities favoring confluent edema, small left pleural effusion.   Electronically Signed   By: Awilda Metro   On: 03/15/2013 04:57   Dg Chest Port 1 View  03/14/2013   CLINICAL DATA:  ETT placement.  EXAM: PORTABLE CHEST - 1 VIEW  COMPARISON:  Chest x-ray from yesterday.  FINDINGS: Endotracheal tube ends in the mid thoracic trachea. Left IJ central venous catheter in similar position, likely in the left brachiocephalic vein. Stable positioning of biventricular pacer wires.  Pulmonary edema with perihilar opacities likely representing alveolar edema, increased from prior. Cardiomegaly. The heart is partly seen. No increasing pleural fluid. No pneumothorax.   IMPRESSION: 1. Endotracheal tube in good position. 2. Pulmonary edema which has likely increased since yesterday. 3. Patchy lung opacities which could be alveolar edema or pneumonia.   Electronically Signed   By: Tiburcio Pea M.D.   On: 03/14/2013 03:17   Dg Abd Portable 1v  03/14/2013   CLINICAL DATA:  Subsequent evaluation of nasogastric tube positioning.  EXAM: PORTABLE ABDOMEN - 1 VIEW  COMPARISON:  Portable abdomen examination yesterday.  FINDINGS: Nasogastric tube tip projects  over the mid body of the stomach. Bowel gas pattern unremarkable.  IMPRESSION: 1. Nasogastric tube tip projects over the mid body of the stomach. 2. No acute abdominal abnormality.   Electronically Signed   By: Hulan Saas M.D.   On: 03/14/2013 08:30     CXR:  Diffuse bilateral airspace disease appears increased 11/2  ASSESSMENT / PLAN:  PULMONARY A: Acute on chronic respiratory failure in setting of pulmonary edema +/- PNA/HCAP Decompensated OHS/OSA P:   On 50%, if able to 40% will start cpap5 ps 5, goal 1 hr No extubation planned pcxr in am  Neg balance goals abx Maintain same MV on rest mode  CARDIOVASCULAR A:  Decompensated systolic (EF 35-40%) and diastolic heart failure Pulmonary edema  CAF (on coumadin) P:  Diuresis, may consider increase, pcxr increased edema? Afterload reduction Heparin per pharmacy   RENAL A:   Stage III CKD-->baseline scr 1.6-->1.7 Hyperkalemia -->s/p Kayexalate   P:   Cont lasix Am chem  GASTROINTESTINAL A:   Obesity  P:   PPI Tf to goal  HEMATOLOGIC A:   Anemia Therapeutic anticoagulation  P:  Cont warfarin per pharmacy ppi Am cbc   INFECTIOUS A:   R/o HCAP vs aspiration  P:   D/c levaquin/vanc Continue cefepime  ENDOCRINE A:   DM w/ hyperglycemia Hypothyroidism  P:   ssi Cont thyroid replacement   NEUROLOGIC A:   Acute encephalopathy >mix of hypercarbia and hypoxia  P:   Supportive care WUA  TODAY'S SUMMARY:   Continue lasix, wean antibiotics, wean FiO2  Ccm time 30 min   Yolonda Kida PCCM Pager: [phone] Cell: [phone] If no response, call [phone]

## 2013-03-16 ENCOUNTER — Inpatient Hospital Stay (HOSPITAL_COMMUNITY): Payer: Medicare Other

## 2013-03-16 ENCOUNTER — Encounter: Payer: Self-pay | Admitting: *Deleted

## 2013-03-16 LAB — BLOOD GAS, ARTERIAL
Acid-Base Excess: 9.1 mmol/L — ABNORMAL HIGH (ref 0.0–2.0)
Drawn by: 33234
MECHVT: 440 mL
O2 Saturation: 94.6 %
PEEP: 5 cmH2O
Patient temperature: 98.6
RATE: 20 resp/min
pCO2 arterial: 55.1 mmHg — ABNORMAL HIGH (ref 35.0–45.0)
pH, Arterial: 7.408 (ref 7.350–7.450)

## 2013-03-16 LAB — CBC WITH DIFFERENTIAL/PLATELET
Basophils Absolute: 0 10*3/uL (ref 0.0–0.1)
Basophils Relative: 0 % (ref 0–1)
Eosinophils Relative: 2 % (ref 0–5)
HCT: 29.7 % — ABNORMAL LOW (ref 36.0–46.0)
Hemoglobin: 9.1 g/dL — ABNORMAL LOW (ref 12.0–15.0)
Lymphocytes Relative: 11 % — ABNORMAL LOW (ref 12–46)
Lymphs Abs: 1.5 10*3/uL (ref 0.7–4.0)
MCV: 97.1 fL (ref 78.0–100.0)
Monocytes Absolute: 1.3 10*3/uL — ABNORMAL HIGH (ref 0.1–1.0)
Monocytes Relative: 10 % (ref 3–12)
Neutro Abs: 10 10*3/uL — ABNORMAL HIGH (ref 1.7–7.7)
RDW: 15.1 % (ref 11.5–15.5)
WBC: 13 10*3/uL — ABNORMAL HIGH (ref 4.0–10.5)

## 2013-03-16 LAB — CULTURE, RESPIRATORY W GRAM STAIN

## 2013-03-16 LAB — GLUCOSE, CAPILLARY
Glucose-Capillary: 173 mg/dL — ABNORMAL HIGH (ref 70–99)
Glucose-Capillary: 233 mg/dL — ABNORMAL HIGH (ref 70–99)
Glucose-Capillary: 306 mg/dL — ABNORMAL HIGH (ref 70–99)

## 2013-03-16 LAB — BASIC METABOLIC PANEL
CO2: 35 mEq/L — ABNORMAL HIGH (ref 19–32)
Chloride: 109 mEq/L (ref 96–112)
Creatinine, Ser: 1.31 mg/dL — ABNORMAL HIGH (ref 0.50–1.10)
GFR calc Af Amer: 47 mL/min — ABNORMAL LOW (ref >90)
Potassium: 3.5 mEq/L (ref 3.5–5.1)

## 2013-03-16 LAB — CLOSTRIDIUM DIFFICILE BY PCR: Toxigenic C. Difficile by PCR: NEGATIVE

## 2013-03-16 LAB — PROTIME-INR: INR: 2.15 — ABNORMAL HIGH (ref 0.00–1.49)

## 2013-03-16 MED ORDER — ASPIRIN 81 MG PO CHEW
81.0000 mg | CHEWABLE_TABLET | Freq: Every day | ORAL | Status: DC
  Administered 2013-03-17 – 2013-03-24 (×7): 81 mg via ORAL
  Filled 2013-03-16 (×7): qty 1

## 2013-03-16 MED ORDER — FUROSEMIDE 10 MG/ML IJ SOLN
60.0000 mg | Freq: Two times a day (BID) | INTRAMUSCULAR | Status: AC
  Administered 2013-03-16 – 2013-03-17 (×2): 60 mg via INTRAVENOUS
  Filled 2013-03-16: qty 6

## 2013-03-16 MED ORDER — DEXTROSE 5 % IV SOLN
INTRAVENOUS | Status: DC
  Administered 2013-03-16 – 2013-03-18 (×3): via INTRAVENOUS

## 2013-03-16 MED ORDER — INSULIN ASPART 100 UNIT/ML ~~LOC~~ SOLN
3.0000 [IU] | SUBCUTANEOUS | Status: DC
  Administered 2013-03-16 – 2013-03-18 (×10): 3 [IU] via SUBCUTANEOUS

## 2013-03-16 MED ORDER — WARFARIN SODIUM 5 MG PO TABS
5.0000 mg | ORAL_TABLET | Freq: Once | ORAL | Status: AC
  Administered 2013-03-16: 5 mg via ORAL
  Filled 2013-03-16: qty 1

## 2013-03-16 MED ORDER — DEXMEDETOMIDINE HCL IN NACL 400 MCG/100ML IV SOLN
0.2000 ug/kg/h | INTRAVENOUS | Status: DC
  Administered 2013-03-16: 0.4 ug/kg/h via INTRAVENOUS
  Administered 2013-03-17: 0.6 ug/kg/h via INTRAVENOUS
  Administered 2013-03-17 – 2013-03-18 (×3): 0.4 ug/kg/h via INTRAVENOUS
  Filled 2013-03-16 (×6): qty 100

## 2013-03-16 MED ORDER — DEXMEDETOMIDINE HCL IN NACL 200 MCG/50ML IV SOLN: 0.4000 ug/kg/h | INTRAVENOUS | Status: DC

## 2013-03-16 MED ORDER — BUDESONIDE 0.5 MG/2ML IN SUSP
0.5000 mg | Freq: Two times a day (BID) | RESPIRATORY_TRACT | Status: DC
  Administered 2013-03-16 – 2013-03-21 (×11): 0.5 mg via RESPIRATORY_TRACT
  Filled 2013-03-16 (×15): qty 2

## 2013-03-16 MED ORDER — POTASSIUM CHLORIDE 20 MEQ/15ML (10%) PO LIQD
40.0000 meq | Freq: Once | ORAL | Status: AC
  Administered 2013-03-16: 40 meq
  Filled 2013-03-16: qty 30

## 2013-03-16 MED ORDER — VITAL HIGH PROTEIN PO LIQD
1000.0000 mL | ORAL | Status: DC
  Administered 2013-03-17 – 2013-03-18 (×3): 1000 mL
  Filled 2013-03-16 (×6): qty 1000

## 2013-03-16 MED ORDER — ALBUTEROL SULFATE (5 MG/ML) 0.5% IN NEBU
2.5000 mg | INHALATION_SOLUTION | Freq: Four times a day (QID) | RESPIRATORY_TRACT | Status: DC
  Administered 2013-03-16 – 2013-03-22 (×23): 2.5 mg via RESPIRATORY_TRACT
  Filled 2013-03-16 (×23): qty 0.5

## 2013-03-16 MED ORDER — FAMOTIDINE 40 MG/5ML PO SUSR
20.0000 mg | Freq: Two times a day (BID) | ORAL | Status: DC
  Administered 2013-03-16 – 2013-03-18 (×5): 20 mg
  Filled 2013-03-16 (×6): qty 2.5

## 2013-03-16 MED ORDER — IPRATROPIUM BROMIDE 0.02 % IN SOLN
0.5000 mg | Freq: Four times a day (QID) | RESPIRATORY_TRACT | Status: DC
  Administered 2013-03-16 – 2013-03-22 (×23): 0.5 mg via RESPIRATORY_TRACT
  Filled 2013-03-16 (×23): qty 2.5

## 2013-03-16 MED ORDER — INSULIN ASPART 100 UNIT/ML ~~LOC~~ SOLN: 3.0000 [IU] | SUBCUTANEOUS | Status: DC

## 2013-03-16 MED ORDER — LOPERAMIDE HCL 1 MG/5ML PO LIQD
2.0000 mg | ORAL | Status: DC | PRN
  Administered 2013-03-16: 2 mg
  Filled 2013-03-16: qty 10

## 2013-03-16 NOTE — Progress Notes (Signed)
PULMONARY  / CRITICAL CARE MEDICINE  Name: Destiny [name] MRN: [id number] DOB: 19-Nov-1940    ADMISSION DATE:  03/05/2013 CONSULTATION DATE:  10/31  REFERRING MD : Robb Matar  PRIMARY SERVICE: [facility]-->PCCM   CHIEF COMPLAINT:  Acute resp failure   BRIEF PATIENT DESCRIPTION:  23 yof w/ mult co-morbids: AF, CHF (EF 35-40%), obesity, OSA. Admitted 10/23 w/ what was felt to be decompensated heart failure, +/- bronchitis and cellulitis. Initially got better from symptom stand-point. On 10/31 developed increased fever, increased work of breathing and delirium. PCCM asked to see for progressive respiratory failure.   SIGNIFICANT EVENTS / STUDIES:  ECHO 10/24: EF 35-40% gd II diastolic dysfxn 10/31- self extubation, reintubated, ABG 7.27pH, blood culture x 2, resp cx, and urine cx drawn 11/3 ABG pH 7.408  LINES / TUBES: OETT 1031>>>self ext ETT>>>re-placed left IJ 10/31>>> Foley 10/31>>> NG 11/1>>>  CULTURES: Respiratory culture 10/31>>> BCX2 10/31>>> UC 10/31>>>  ANTIBIOTICS: levaquin 10/30>>11/2 discontinued Zosyn 10/31>>10/31 discontinued vanc 10/30>>>11/2 discontinued cefipime 10/31>>>  SUBJECTIVE: Still intubated, asleep. Began goals of care discussion with family with no decisions made at this time. Husband reported that prior to being hospitalized patient had been getting short of breath walking through house and was on 2-3L O2 by [place] at least for the past few months.  VITAL SIGNS: Temp:  [98.6 F (37 C)-99.4 F (37.4 C)] 99.3 F (37.4 C) (11/03 0748) Pulse Rate:  [66-88] 71 (11/03 1300) Resp:  [12-33] 17 (11/03 1300) BP: (131-177)/(42-102) 164/50 mmHg (11/03 1300) SpO2:  [88 %-99 %] 93 % (11/03 1300) FiO2 (%):  [50 %-60 %] 60 % (11/03 1212) Weight:  [270 lb 15.1 oz (122.9 kg)] 270 lb 15.1 oz (122.9 kg) (11/03 0500) HEMODYNAMICS:   VENTILATOR SETTINGS: Vent Mode:  [-] PRVC FiO2 (%):  [50 %-60 %] 60 % Set Rate:  [15 bmp-20 bmp] 15 bmp Vt Set:  [440 mL] 440 mL PEEP:  [5  cmH20] 5 cmH20 Pressure Support:  [12 cmH20] 12 cmH20 Plateau Pressure:  [23 cmH20-26 cmH20] 23 cmH20 INTAKE / OUTPUT: Intake/Output     11/02 0701 - 11/03 0700 11/03 0701 - 11/04 0700   I.V. (mL/kg) 658.5 (5.4) 71.8 (0.6)   NG/GT 900 330   IV Piggyback 100 50   Total Intake(mL/kg) 1658.5 (13.5) 451.8 (3.7)   Urine (mL/kg/hr) 2910 (1) 1030 (1.1)   Total Output 2910 1030   Net -1251.5 -578.3          PHYSICAL EXAMINATION:  Gen: Obese, comfortable on vent HEENT: NCAT, ETT in place, NG tube in place PULM: diminished in bases, otherwise clear and distant due to habitus CV: RRR, distant heart sounds AB: BS+, soft, obese Ext: edema noted Neuro: sedated/asleep, moves extremities spontaneously  LABS: Reviewed and relevant labs documented in assessment and plan.   CXR:  11/2: Diffuse bilateral airspace disease appears increased  11/3: No significant change to diffuse bilateral airspace disease  ASSESSMENT / PLAN:  PULMONARY A: Acute on chronic respiratory failure in setting of pulmonary edema +/- PNA/HCAP Decompensated OHS/OSA P:   On 50%, if able to 40% will start cpap5 ps 5, goal 1 hr No extubation planned pcxr and BNP in am  Lasix 60mg  IV x 2 doses, reeval tomorrow Replete K Neg balance goals cefipime Maintain same MV on rest mode Increase bronchodilator Decr RR to 15 on vent settings  CARDIOVASCULAR A:  Decompensated systolic (EF 35-40%) and diastolic heart failure Pulmonary edema  CAF (on coumadin) P:  Diuresis, may consider increase, pcxr  increased edema? Afterload reduction Heparin per pharmacy   RENAL A:   Stage III CKD-->baseline scr 1.6-->1.7 Hyperkalemia -->s/p Kayexalate  P:   Cont lasix with K per tube now that normal K Am chem Add D5 water for hypernatremia  GASTROINTESTINAL A:   Obesity  P:   PPI Tf to goal  HEMATOLOGIC A:   Anemia Therapeutic anticoagulation  P:  Cont warfarin per pharmacy ppi Am cbc   INFECTIOUS A:   R/o  HCAP vs aspiration, CXR no imprvement P:   Continue cefepime CXR in AM  ENDOCRINE A:   DM w/ hyperglycemia Hypothyroidism P:   ssi plus 3 units Q4 hours Cont thyroid replacement   NEUROLOGIC A:   Acute encephalopathy >mix of hypercarbia and hypoxia  P:   Supportive care WUA  TODAY'S SUMMARY:  Continue lasix, add free water, BNP tomorrow, stop midazolam, repeat CXR tomorrow, add Kdur, change RR to 15.  Ccm time 30 min   Benjamin Stain [facility] PGY-2 Pulmonary and Critical Care Medicine  [facility]  Pager: [phone]     Husband and sons updated in detail @ bedside  PCCM ATTENDING: I have interviewed and examined the patient and reviewed the database. I have formulated the assessment and plan as reflected in the note above with amendments made by me.  Billy Fischer, MD;  PCCM service; Mobile [phone]  Billy Fischer, MD ; [facility] service Mobile [phone].  After 5:30 PM or weekends, call [phone]

## 2013-03-16 NOTE — Progress Notes (Signed)
Inpatient Diabetes Program Recommendations  AACE/ADA: New Consensus Statement on Inpatient Glycemic Control (2013)  Target Ranges:  Prepandial:   less than 140 mg/dL      Peak postprandial:   less than 180 mg/dL (1-2 hours)      Critically ill patients:  140 - 180 mg/dL  Results for Destiny [name], Destiny [name] (MRN [id number]) as of 03/16/2013 11:49  Ref. Range 03/15/2013 15:53 03/15/2013 19:38 03/16/2013 00:03 03/16/2013 03:58 03/16/2013 07:47  Glucose-Capillary Latest Range: 70-99 mg/dL 782 (H) 956 (H) 213 (H) 164 (H) 173 (H)   Inpatient Diabetes Program Recommendations Insulin - Basal: consider adding basal Lantus or Levemir 10 units  Thank you  Piedad Climes BSN, RN,CDE Inpatient Diabetes Coordinator [phone] (team pager)

## 2013-03-16 NOTE — Progress Notes (Signed)
RN requesting diprivan v precerdex  Camear care look stable  Plan precedex order sent   Dr. Kalman Shan, M.D., [facility].C.P Pulmonary and Critical Care Medicine Staff Physician [facility] Pager: [phone], If no answer or between  15:00h - 7:00h: call [phone]  03/16/2013 7:19 PM

## 2013-03-16 NOTE — Progress Notes (Signed)
NUTRITION FOLLOW UP  Intervention:    Change TF to Vital HP at 15 ml/h, increase by 10 ml every 4 hours to goal rate of 55 ml/h to provide 1320 kcals (24 kcals/kg ideal weight), 116 gm protein (84% of estimated needs), and 1104 ml free water daily.  No Prostat for now. Will evaluate tolerance of TF change and add protein supplement as able.  Nutrition Dx:   Inadequate oral intake related to inability to eat as evidenced by NPO status. Ongoing.  Goal:   Enteral nutrition to provide 60-70% of estimated calorie needs (22-25 kcals/kg ideal body weight) and 100% of estimated protein needs, based on ASPEN guidelines for permissive underfeeding in critically ill obese individuals. Met.  Monitor:   TF tolerance/adequacy, weight trend, labs, vent status.  Assessment:   Patient was admitted on 10/23 with what was felt to be decompensated heart failure, +/- bronchitis and cellulitis. Initially got better from symptom stand-point. On 10/31 developed increased fever, increased work of breathing and delirium. Required intubation on 10/31.   Patient remains intubated on ventilator support. MD is discussing goals of care with family. MV: 7.3 L/min Temp:Temp (24hrs), Avg:99 F (37.2 C), Min:98.6 F (37 C), Max:99.4 F (37.4 C)   RN reports that patient is having watery stools. Requests that RD re-evaluate TF. May have some intolerance to Prostat due to its osmolality. Will change patient to a semi-elemental formula and D/C Prostat to help improve tolerance.  Height: Ht Readings from Last 1 Encounters:  03/09/13 5\' 4"  (1.626 m)    Weight Status:   Wt Readings from Last 1 Encounters:  03/16/13 270 lb 15.1 oz (122.9 kg)  03/13/13  267 lb 10.2 oz (121.4 kg)   Re-estimated needs:  Kcal: 1500 Protein: 136 gm Fluid: 1.5 L  Skin: no problems  Diet Order: NPO  TF Order: Osmolite 1.5 at 15 ml/h with Prostat 60 ml QID to provide 1340 kcals (24 kcals/kg ideal weight), 143 gm protein, 274 ml free  water daily    Intake/Output Summary (Last 24 hours) at 03/16/13 1539 Last data filed at 03/16/13 1400  Gross per 24 hour  Intake 1468.25 ml  Output   2765 ml  Net -1296.75 ml    Last BM: 11/3 (watery)   Labs:   Recent Labs Lab 03/14/13 0455 03/15/13 0319 03/16/13 0400  NA 141 146* 149*  K 4.3 3.8 3.5  CL 101 106 109  CO2 31 33* 35*  BUN 84* 77* 83*  CREATININE 1.56* 1.31* 1.31*  CALCIUM 8.4 8.8 9.0  GLUCOSE 247* 248* 209*    CBG (last 3)   Recent Labs  03/16/13 0358 03/16/13 0747 03/16/13 1157  GLUCAP 164* 173* 233*    Scheduled Meds: . albuterol  2.5 mg Nebulization Q6H  . antiseptic oral rinse  15 mL Mouth Rinse QID  . aspirin  81 mg Oral Daily  . budesonide (PULMICORT) nebulizer solution  0.5 mg Nebulization BID  . carvedilol  12.5 mg Per Tube BID WC  . ceFEPime (MAXIPIME) IV  1 g Intravenous Q12H  . chlorhexidine  15 mL Mouth Rinse BID  . famotidine  20 mg Per Tube BID  . feeding supplement (OSMOLITE 1.5 CAL)  1,000 mL Per Tube Q24H  . feeding supplement (PRO-STAT SUGAR FREE 64)  60 mL Per Tube QID  . furosemide  60 mg Intravenous BID  . insulin aspart  0-20 Units Subcutaneous Q4H  . insulin aspart  3 Units Subcutaneous Q4H  . ipratropium  0.5 mg Nebulization Q6H  . levothyroxine  25 mcg Per Tube QAC breakfast  . multivitamin  5 mL Per Tube Daily  . warfarin  5 mg Oral ONCE-1800  . Warfarin - Pharmacist Dosing Inpatient   Does not apply q1800    Continuous Infusions: . dextrose 50 mL/hr at 03/16/13 1347  . fentaNYL infusion INTRAVENOUS 125 mcg/hr (03/16/13 1228)    Joaquin Courts, RD, LDN, CNSC Pager [phone] After Hours Pager [phone]

## 2013-03-16 NOTE — Progress Notes (Signed)
ANTICOAGULATION CONSULT NOTE -  Pharmacy Consult for coumadin Indication: afib  Allergies  Allergen Reactions  . Sulfonamide Derivatives Other (See Comments)    Unknown; "childhood allergy/mother"    Patient Measurements: Height: 5\' 4"  (162.6 cm) Weight: 270 lb 15.1 oz (122.9 kg) IBW/kg (Calculated) : 54.7 Heparin Dosing Weight: 84.5kg  Vital Signs: Temp: 99.3 F (37.4 C) (11/03 0748) Temp src: Oral (11/03 0748) BP: 152/51 mmHg (11/03 0800) Pulse Rate: 72 (11/03 0800)  Labs:  Recent Labs  03/14/13 0137  03/14/13 0455 03/15/13 0319 03/16/13 0400  HGB  --   < > 9.0* 9.2* 9.1*  HCT  --   --  28.3* 29.3* 29.7*  PLT  --   --  210 196 210  LABPROT  --   --  26.3* 24.1* 23.3*  INR  --   --  2.52* 2.24* 2.15*  CREATININE  --   --  1.56* 1.31* 1.31*  TROPONINI <0.30  --   --   --   --   < > = values in this interval not displayed.  Estimated Creatinine Clearance: 51.7 ml/min (by C-G formula based on Cr of 1.31).  Medications:  Scheduled:  . antiseptic oral rinse  15 mL Mouth Rinse QID  . aspirin  81 mg Oral Daily  . carvedilol  12.5 mg Per Tube BID WC  . ceFEPime (MAXIPIME) IV  1 g Intravenous Q12H  . chlorhexidine  15 mL Mouth Rinse BID  . feeding supplement (OSMOLITE 1.5 CAL)  1,000 mL Per Tube Q24H  . feeding supplement (PRO-STAT SUGAR FREE 64)  60 mL Per Tube QID  . furosemide  60 mg Intravenous BID  . insulin aspart  0-20 Units Subcutaneous Q4H  . levothyroxine  25 mcg Per Tube QAC breakfast  . multivitamin  5 mL Per Tube Daily  . pantoprazole sodium  40 mg Per Tube Q1200  . Warfarin - Pharmacist Dosing Inpatient   Does not apply q1800    Assessment: 72 yo female here with HF on coumadin PTA for afib (also history of mural thrombus). Coumadin has been restarted and her INR was trending up yesterday so dose was reduce. INR decrease slightly. Of note coumadin dosing 10/24-10/30 was 7.5mg -12.5mg .  Home coumadin regimen: 7.5 TTHSS, 5mg  MWF  Goal of Therapy:   INR= 2-3 Monitor platelets by anticoagulation protocol: Yes   Plan:  -Coumadin 5mg  PO/PT x1 - Daily PT/INR

## 2013-03-17 ENCOUNTER — Inpatient Hospital Stay (HOSPITAL_COMMUNITY): Payer: Medicare Other

## 2013-03-17 DIAGNOSIS — J811 Chronic pulmonary edema: Secondary | ICD-10-CM

## 2013-03-17 DIAGNOSIS — I5042 Chronic combined systolic (congestive) and diastolic (congestive) heart failure: Secondary | ICD-10-CM

## 2013-03-17 LAB — GLUCOSE, CAPILLARY
Glucose-Capillary: 115 mg/dL — ABNORMAL HIGH (ref 70–99)
Glucose-Capillary: 165 mg/dL — ABNORMAL HIGH (ref 70–99)
Glucose-Capillary: 188 mg/dL — ABNORMAL HIGH (ref 70–99)
Glucose-Capillary: 203 mg/dL — ABNORMAL HIGH (ref 70–99)
Glucose-Capillary: 240 mg/dL — ABNORMAL HIGH (ref 70–99)

## 2013-03-17 LAB — PROTIME-INR: INR: 1.97 — ABNORMAL HIGH (ref 0.00–1.49)

## 2013-03-17 LAB — BASIC METABOLIC PANEL
BUN: 73 mg/dL — ABNORMAL HIGH (ref 6–23)
CO2: 34 mEq/L — ABNORMAL HIGH (ref 19–32)
Chloride: 110 mEq/L (ref 96–112)
GFR calc Af Amer: 49 mL/min — ABNORMAL LOW (ref >90)
Potassium: 3.8 mEq/L (ref 3.5–5.1)
Sodium: 150 mEq/L — ABNORMAL HIGH (ref 135–145)

## 2013-03-17 LAB — PRO B NATRIURETIC PEPTIDE: Pro B Natriuretic peptide (BNP): 3616 pg/mL — ABNORMAL HIGH (ref 0–125)

## 2013-03-17 MED ORDER — WARFARIN SODIUM 7.5 MG PO TABS
7.5000 mg | ORAL_TABLET | Freq: Once | ORAL | Status: AC
  Administered 2013-03-17: 7.5 mg via ORAL
  Filled 2013-03-17: qty 1

## 2013-03-17 MED ORDER — POTASSIUM CHLORIDE 20 MEQ/15ML (10%) PO LIQD
40.0000 meq | Freq: Two times a day (BID) | ORAL | Status: AC
  Administered 2013-03-17 (×2): 40 meq
  Filled 2013-03-17 (×2): qty 30

## 2013-03-17 MED ORDER — FENTANYL CITRATE 0.05 MG/ML IJ SOLN
50.0000 ug | INTRAMUSCULAR | Status: DC | PRN
  Administered 2013-03-17: 50 ug via INTRAVENOUS
  Filled 2013-03-17: qty 2

## 2013-03-17 MED ORDER — SODIUM CHLORIDE 0.9 % IV SOLN
25.0000 ug/h | INTRAVENOUS | Status: DC
  Administered 2013-03-17: 250 ug/h via INTRAVENOUS
  Filled 2013-03-17: qty 50

## 2013-03-17 MED ORDER — FUROSEMIDE 10 MG/ML IJ SOLN
60.0000 mg | Freq: Two times a day (BID) | INTRAMUSCULAR | Status: AC
  Administered 2013-03-17 – 2013-03-18 (×2): 60 mg via INTRAVENOUS
  Filled 2013-03-17 (×2): qty 6

## 2013-03-17 MED ORDER — FENTANYL CITRATE 0.05 MG/ML IJ SOLN: 25.0000 ug | INTRAMUSCULAR | Status: DC | PRN

## 2013-03-17 MED ORDER — MIDAZOLAM HCL 2 MG/2ML IJ SOLN
INTRAMUSCULAR | Status: AC
  Filled 2013-03-17: qty 4

## 2013-03-17 NOTE — Progress Notes (Signed)
PT Cancellation Note  Patient Details Name: GENASIS ZINGALE MRN: [id number] DOB: 1940-06-20   Cancelled Treatment:    Reason Eval/Treat Not Completed: Medical issues which prohibited therapy.  RN recommending this PM ~13:30 so that pt will be less sedated.  PT to check back around 13:30.   Rollene Rotunda Ranika Mcniel, PT, DPT [phone]   03/17/2013, 9:39 AM

## 2013-03-17 NOTE — Progress Notes (Signed)
Rehab Admissions Coordinator Note:  Patient was screened by Clois Dupes for appropriateness for an Inpatient Acute Rehab Consult.  At this time, we are recommending follow her progress as she is currently intubated.Clois Dupes 03/17/2013, 3:50 PM  I can be reached at [phone].

## 2013-03-17 NOTE — Progress Notes (Signed)
ANTICOAGULATION CONSULT NOTE -  Pharmacy Consult for coumadin Indication: afib  Allergies  Allergen Reactions  . Sulfonamide Derivatives Other (See Comments)    Unknown; "childhood allergy/mother"    Patient Measurements: Height: 5\' 4"  (162.6 cm) Weight: 268 lb 15.4 oz (122 kg) IBW/kg (Calculated) : 54.7 Heparin Dosing Weight: 84.5kg  Vital Signs: Temp: 99.4 F (37.4 C) (11/04 0848) Temp src: Oral (11/04 0848) BP: 172/53 mmHg (11/04 0829) Pulse Rate: 61 (11/04 0829)  Labs:  Recent Labs  03/15/13 0319 03/16/13 0400 03/17/13 0430  HGB 9.2* 9.1*  --   HCT 29.3* 29.7*  --   PLT 196 210  --   LABPROT 24.1* 23.3* 21.8*  INR 2.24* 2.15* 1.97*  CREATININE 1.31* 1.31* 1.26*    Estimated Creatinine Clearance: 53.5 ml/min (by C-G formula based on Cr of 1.26).  Medications:  Scheduled:  . albuterol  2.5 mg Nebulization Q6H  . antiseptic oral rinse  15 mL Mouth Rinse QID  . aspirin  81 mg Oral Daily  . budesonide (PULMICORT) nebulizer solution  0.5 mg Nebulization BID  . carvedilol  12.5 mg Per Tube BID WC  . ceFEPime (MAXIPIME) IV  1 g Intravenous Q12H  . chlorhexidine  15 mL Mouth Rinse BID  . famotidine  20 mg Per Tube BID  . insulin aspart  0-20 Units Subcutaneous Q4H  . insulin aspart  3 Units Subcutaneous Q4H  . ipratropium  0.5 mg Nebulization Q6H  . levothyroxine  25 mcg Per Tube QAC breakfast  . multivitamin  5 mL Per Tube Daily  . warfarin  7.5 mg Oral ONCE-1800  . Warfarin - Pharmacist Dosing Inpatient   Does not apply q1800    Assessment: 72 yo female here with HF on coumadin PTA for afib (also history of mural thrombus). INR donw slightly today. Will adjust dose up a little. Of note coumadin dosing 10/24-10/30 was 7.5mg -12.5mg .  Home coumadin regimen: 7.5 TTHSS, 5mg  MWF  Goal of Therapy:  INR= 2-3 Monitor platelets by anticoagulation protocol: Yes   Plan:  -Coumadin 7.5mg  PO/PT x1 - Daily PT/INR

## 2013-03-17 NOTE — Evaluation (Signed)
Occupational Therapy Evaluation Patient Details Name: Destiny [name] MRN: [id number] DOB: 09-05-1940 Today's Date: 03/17/2013 Time: 0865-7846 OT Time Calculation (min): 15 min  OT Assessment / Plan / Recommendation History of present illness 72 yo female with h/o chf, s/p cabg, ckd, aicd, on home oxygen 2 L [place] cont (this has increased over the last month, was prev only prn during day) comes in with several days of worsening sob and cough. On 10/31 developed increased fever, increased work of breathing and delirium.  Pt intubated   Clinical Impression   Pt admitted with above.  She is currently intubated with ETT tube.  She demonstrates the below listed deficits and will benefit from continued OT to maximize safety and independence with BADLs.  D/C recommendation will be dependent upon respiratory status and ability to wean from vent.  If she weans well, recommend CIR, if respiratory status continues to be compromised, recommend LTACH.  Pt on 40% FIO2 with sats remaining >94% throughout session.     OT Assessment  Patient needs continued OT Services    Follow Up Recommendations  CIR;LTACH;Supervision/Assistance - 24 hour (depending on respiratory status)    Barriers to Discharge Decreased caregiver support husband cannot provide current level of assist  Equipment Recommendations  None recommended by OT    Recommendations for Other Services Rehab consult  Frequency  Min 2X/week    Precautions / Restrictions Precautions Precautions: Fall Precaution Comments: Pt with ETT tube; multiple lines Restrictions Weight Bearing Restrictions: No   Pertinent Vitals/Pain     ADL  Eating/Feeding: NPO Grooming: Wash/dry hands;Moderate assistance Where Assessed - Grooming: Supported sitting Upper Body Bathing: +1 Total assistance Where Assessed - Upper Body Bathing: Supported sitting Lower Body Bathing: +1 Total assistance Where Assessed - Lower Body Bathing: Supported sit to stand Upper Body  Dressing: +1 Total assistance Where Assessed - Upper Body Dressing: Supported sitting;Unsupported sitting Lower Body Dressing: +1 Total assistance Where Assessed - Lower Body Dressing: Supported sit to Pharmacist, hospital: +2 Total assistance Toilet Transfer: Patient Percentage: 60% Toilet Transfer Method: Sit to stand Toileting - Clothing Manipulation and Hygiene: +1 Total assistance Where Assessed - Toileting Clothing Manipulation and Hygiene: Standing Transfers/Ambulation Related to ADLs: Total A +2 (and third person to manage lines) pt ~60%    OT Diagnosis: Generalized weakness;Cognitive deficits  OT Problem List: Decreased strength;Decreased activity tolerance;Impaired balance (sitting and/or standing);Decreased cognition;Decreased safety awareness;Decreased knowledge of use of DME or AE;Cardiopulmonary status limiting activity;Obesity OT Treatment Interventions: Self-care/ADL training;Therapeutic exercise;DME and/or AE instruction;Therapeutic activities;Cognitive remediation/compensation;Patient/family education;Balance training   OT Goals(Current goals can be found in the care plan section) Acute Rehab OT Goals Patient Stated Goal: Unable to state OT Goal Formulation: With patient Time For Goal Achievement: 03/31/13 Potential to Achieve Goals: Good ADL Goals Pt Will Perform Grooming: standing;with min assist Pt Will Perform Upper Body Bathing: with min assist;sitting Pt Will Perform Lower Body Bathing: with mod assist;sit to/from stand Pt Will Transfer to Toilet: with min assist;regular height toilet;bedside commode;ambulating;grab bars Pt Will Perform Toileting - Clothing Manipulation and hygiene: with mod assist;sit to/from stand Pt/caregiver will Perform Home Exercise Program: Increased ROM;Increased strength;Both right and left upper extremity;With theraband;With Supervision Additional ADL Goal #1: Pt will incorporate energy conservation techniques into BADLs with min  cues  Visit Information  Last OT Received On: 03/17/13 Assistance Needed: +1 History of Present Illness: 72 yo female with h/o chf, s/p cabg, ckd, aicd, on home oxygen 2 L [place] cont (this has increased over the last month,  was prev only prn during day) comes in with several days of worsening sob and cough. On 10/31 developed increased fever, increased work of breathing and delirium.  Pt intubated       Prior Functioning     Home Living Family/patient expects to be discharged to:: Private residence Living Arrangements: Spouse/significant other Available Help at Discharge: Family;Available 24 hours/day Type of Home: House Home Access: Ramped entrance Home Layout: One level Home Equipment: Walker - 4 wheels Additional Comments: Pt unable to provide details of bathroom due to ETT tube Prior Function Level of Independence: Independent Comments: but fatigued quickly per chart Communication Communication: Other (comment) (Pt with ETT tube.  Nods yes/no) Dominant Hand: Right         Vision/Perception Vision - History Baseline Vision: Wears glasses only for reading Vision - Assessment Vision Assessment: Vision not tested   Cognition  Cognition Arousal/Alertness: Lethargic;Suspect due to medications Behavior During Therapy: Flat affect Overall Cognitive Status: Impaired/Different from baseline Area of Impairment: Orientation;Following commands Orientation Level: Place (with multiple choice questions) Following Commands: Follows one step commands with increased time (and min A due to arrousal) General Comments: Pt fatigued and difficulty maintaining full arrousal  Difficult to assess due to: Level of arousal;Intubated    Extremity/Trunk Assessment Upper Extremity Assessment Upper Extremity Assessment: Generalized weakness Lower Extremity Assessment Lower Extremity Assessment: Defer to PT evaluation Cervical / Trunk Assessment Cervical / Trunk Assessment: Normal      Mobility Bed Mobility Bed Mobility: Not assessed Transfers Transfers: Sit to Stand;Stand to Sit Sit to Stand: With upper extremity assist;1: +2 Total assist;From chair/3-in-1 Sit to Stand: Patient Percentage: 60% Stand to Sit: 1: +2 Total assist;With upper extremity assist;To chair/3-in-1 Stand to Sit: Patient Percentage: 60% Details for Transfer Assistance: Pt required assist to lift buttocks from chair.  Rt. knee blocked.  and assist to control descent      Exercise     Balance Balance Balance Assessed: Yes Static Standing Balance Static Standing - Balance Support: Bilateral upper extremity supported Static Standing - Level of Assistance: 1: +2 Total assist;Patient percentage (comment) (pt 60%) Static Standing - Comment/# of Minutes: 30 seconds   End of Session OT - End of Session Equipment Utilized During Treatment: Other (comment) (ETT tube/vent) Activity Tolerance: Patient limited by lethargy;Patient limited by fatigue Patient left: in chair;with call bell/phone within reach Nurse Communication: Mobility status  GO     Jeani Hawking M 03/17/2013, 3:07 PM

## 2013-03-17 NOTE — Evaluation (Signed)
Physical Therapy Evaluation Patient Details Name: Destiny [name] MRN: [id number] DOB: 1940/11/16 Today's Date: 03/17/2013 Time: 1478-2956 PT Time Calculation (min): 15 min  PT Assessment / Plan / Recommendation History of Present Illness  72 yo female with h/o chf, s/p cabg, ckd, aicd, on home oxygen 2 L [place] cont (this has increased over the last month, was prev only prn during day) comes in with several days of worsening sob and cough. On 10/31 developed increased fever, increased work of breathing and delirium.  Pt intubated  Clinical Impression  Pt is still intubated and somewhat sedated.  She is able to follow simple commands with extra time and was able to get onto her feet for a brief period of time today.  Her arms and legs are weak and her knees buckled under the weight of her body in standing.  She would be an excellent inpatient rehab candidate.     PT Assessment  Patient needs continued PT services    Follow Up Recommendations  CIR    Does the patient have the potential to tolerate intense rehabilitation     Yes  Barriers to Discharge   None      Equipment Recommendations  Rolling walker with 5" wheels    Recommendations for Other Services Rehab consult   Frequency Min 3X/week    Precautions / Restrictions Precautions Precautions: Fall Precaution Comments: Pt with ETT tube; multiple lines Restrictions Weight Bearing Restrictions: No   Pertinent Vitals/Pain VSS throughout treatment.      Mobility  Bed Mobility Bed Mobility: Not assessed (pt seated in recliner chair) Transfers Sit to Stand: With upper extremity assist;1: +2 Total assist;From chair/3-in-1 Sit to Stand: Patient Percentage: 60% Stand to Sit: 1: +2 Total assist;With upper extremity assist;To chair/3-in-1 Stand to Sit: Patient Percentage: 60% Details for Transfer Assistance: Pt required assist to lift buttocks from chair.  Rt. knee blocked.  and assist to control descent .  Pt also needed assist to  pull forward on chair to prepare to stand.  After positioned forward, she was able to maintain this position without external assist and with bil upper extremities stabilized on the armrest of the chair.         PT Diagnosis: Difficulty walking;Abnormality of gait;Generalized weakness;Altered mental status  PT Problem List: Decreased strength;Decreased activity tolerance;Decreased balance;Decreased mobility;Decreased cognition;Decreased knowledge of use of DME;Cardiopulmonary status limiting activity;Obesity PT Treatment Interventions: DME instruction;Stair training;Gait training;Functional mobility training;Therapeutic activities;Therapeutic exercise;Balance training;Neuromuscular re-education;Cognitive remediation;Patient/family education;Wheelchair mobility training     PT Goals(Current goals can be found in the care plan section) Acute Rehab PT Goals Patient Stated Goal: Unable to state PT Goal Formulation: Patient unable to participate in goal setting Time For Goal Achievement: 03/31/13 Potential to Achieve Goals: Good  Visit Information  Last PT Received On: 03/17/13 Assistance Needed: +2 (lines- as pt is still ventilated) History of Present Illness: 72 yo female with h/o chf, s/p cabg, ckd, aicd, on home oxygen 2 L [place] cont (this has increased over the last month, was prev only prn during day) comes in with several days of worsening sob and cough. On 10/31 developed increased fever, increased work of breathing and delirium.  Pt intubated       Prior Functioning  Home Living Family/patient expects to be discharged to:: Private residence Living Arrangements: Spouse/significant other Available Help at Discharge: Family;Available 24 hours/day Type of Home: House Home Access: Ramped entrance Home Layout: One level Home Equipment: Walker - 4 wheels Additional Comments: Pt unable to  provide details of bathroom due to ETT tube Prior Function Level of Independence:  Independent Comments: but fatigued quickly per chart Communication Communication: Other (comment) (Pt with ETT nods yes/no) Dominant Hand: Right    Cognition  Cognition Arousal/Alertness: Lethargic;Suspect due to medications Behavior During Therapy: Flat affect Overall Cognitive Status: Impaired/Different from baseline Area of Impairment: Orientation;Following commands Orientation Level: Place Following Commands: Follows one step commands with increased time General Comments: Pt fatigued and difficulty maintaining full arrousal  Difficult to assess due to: Level of arousal;Intubated    Extremity/Trunk Assessment Upper Extremity Assessment Upper Extremity Assessment: Defer to OT evaluation Lower Extremity Assessment Lower Extremity Assessment: Generalized weakness Cervical / Trunk Assessment Cervical / Trunk Assessment: Normal   Balance Balance Balance Assessed: Yes Static Sitting Balance Static Sitting - Balance Support: Bilateral upper extremity supported Static Sitting - Level of Assistance: 5: Stand by assistance Static Sitting - Comment/# of Minutes: supervision once we helped pt pull forward off of recliner to sitting.   Static Standing Balance Static Standing - Balance Support: Bilateral upper extremity supported Static Standing - Level of Assistance: 1: +2 Total assist;Patient percentage (comment) (pt 60%) Static Standing - Comment/# of Minutes: <30 seconds, trunk supported and bil knees blocked.  Pt pulling with bil upper extremities  End of Session PT - End of Session Equipment Utilized During Treatment: Oxygen (ventilator) Activity Tolerance: Patient limited by fatigue;Patient limited by lethargy Patient left: in chair;with call bell/phone within reach       [place] B. Aaleeyah Bias, PT, DPT [phone]   03/17/2013, 5:00 PM

## 2013-03-17 NOTE — Progress Notes (Signed)
PULMONARY  / CRITICAL CARE MEDICINE  Name: Destiny [name] MRN: [id number] DOB: [birth year]    ADMISSION DATE:  03/05/2013 CONSULTATION DATE:  10/31  REFERRING MD : Robb Matar  PRIMARY SERVICE: [facility]-->PCCM   CHIEF COMPLAINT:  Acute resp failure   BRIEF PATIENT DESCRIPTION:  77 yof w/ mult co-morbids: AF, CHF (EF 35-40%), obesity, OSA. Admitted 10/23 w/ what was felt to be decompensated heart failure, +/- bronchitis and cellulitis. Initially got better from symptom stand-point. On 10/31 developed increased fever, increased work of breathing and delirium. PCCM asked to see for progressive respiratory failure.   SIGNIFICANT EVENTS / STUDIES:  ECHO 10/24: EF 35-40% gd II diastolic dysfxn 10/31- self extubation, reintubated, ABG 7.27pH, blood culture x 2, resp cx, and urine cx drawn 11/3 ABG pH 7.408         Diarrhea --> C diff negative         Continued lasix, added free water, added K         Precedex 11/4 weaning on vent, continue lasix and K incr to BID, discontinuing abx, discontinued fentanyl  LINES / TUBES: OETT 1031>>>self ext ETT>>>re-placed>>11/4 weaning on vent left IJ 10/31>>> Foley 10/31>>> NG 11/1>>> Rectal tube 11/3>>>  CULTURES: UC 10/28>>>Insignif growth final Respiratory culture 10/31>>>11/3 mod WBC, no org final BCX2 10/31>>> UC 10/31>>>no growth final  c difficile 11/3>>>11/4 negative  ANTIBIOTICS: levaquin 10/30>>11/2 discontinued Zosyn 10/31>>10/31 discontinued vanc 10/30>>>11/2 discontinued cefipime 10/31>>>11/4 discontinued  SUBJECTIVE: Intubated, awake, follows basic commands this morning.  VITAL SIGNS: Temp:  [99 F (37.2 C)-100.4 F (38 C)] 100.1 F (37.8 C) (11/04 0341) Pulse Rate:  [60-89] 60 (11/04 0700) Resp:  [15-26] 17 (11/04 0700) BP: (138-182)/(41-67) 174/54 mmHg (11/04 0700) SpO2:  [89 %-99 %] 95 % (11/04 0700) FiO2 (%):  [50 %-60 %] 50 % (11/04 0700) Weight:  [268 lb 15.4 oz (122 kg)] 268 lb 15.4 oz (122 kg) (11/04  0430) HEMODYNAMICS:   VENTILATOR SETTINGS: Vent Mode:  [-] PRVC FiO2 (%):  [50 %-60 %] 50 % Set Rate:  [15 bmp-20 bmp] 15 bmp Vt Set:  [440 mL] 440 mL PEEP:  [5 cmH20] 5 cmH20 Plateau Pressure:  [20 cmH20-24 cmH20] 22 cmH20 INTAKE / OUTPUT: Intake/Output     11/03 0701 - 11/04 0700 11/04 0701 - 11/05 0700   I.V. (mL/kg) 1161.4 (9.5)    NG/GT 730    IV Piggyback 100    Total Intake(mL/kg) 1991.4 (16.3)    Urine (mL/kg/hr) 2980 (1)    Stool 250 (0.1)    Total Output 3230     Net -1238.6            PHYSICAL EXAMINATION:  Gen: Obese, comfortable on vent, awake HEENT: NCAT, ETT in place, NG tube in place PULM: diminished anteriorly CV: RRR, distant heart sounds AB: BS+, soft, obese Ext: edema noted trace in LEs bilaterally Neuro: awake, moves extremities spontaneously, EOMI, follows basic commands  LABS: Reviewed and relevant labs documented in assessment and plan.   CXR:  11/2: Diffuse bilateral airspace disease appears increased  11/3: No significant change to diffuse bilateral airspace disease 11/4: Possibly minor improvement to diffuse airspace disease in LUL  ASSESSMENT / PLAN:  PULMONARY A: Acute on chronic respiratory failure in setting of pulmonary edema +/- PNA/HCAP and CHF exac Decompensated OHS/OSA P:   Attempting vent weaning today Repeat pcxr and BNP in am  Lasix 60mg  IV x 2 more doses, reeval tomorrow Replete K, incr to BID Neg balance goals cefipime discontinued 11/4  Maintain same MV on rest mode bronchodilators  CARDIOVASCULAR A:  Decompensated systolic (EF 35-40%) and diastolic heart failure, downtrending BNP Pulmonary edema  CAF (on coumadin) P:  Diuresis two more doses today with K Afterload reduction Heparin per pharmacy   RENAL A:   Stage III CKD-->baseline scr 1.6-->1.7-->1.26 Hyperkalemia -->s/p Kayexalate  P:   Cont lasix with K per tube now that normal K Am chem Added D5 water 11/3 for hypernatremia  GASTROINTESTINAL A:    Obesity  P:   PPI Tf to goal  HEMATOLOGIC A:   Anemia Therapeutic anticoagulation  P:  Cont warfarin per pharmacy ppi Am cbc   INFECTIOUS A:   Doubt PNA P:   Discontinue cefipime 11/4 CXR in AM  ENDOCRINE A:   DM w/ hyperglycemia Hypothyroidism P:   ssi plus 3 units Q4 hours Cont thyroid replacement   NEUROLOGIC A:   Acute encephalopathy >mix of hypercarbia and hypoxia  P:   Supportive care WUA Discontinue fentanyl, added precedex  TODAY'S SUMMARY:  Weaning on vent, continue lasix with K, repeat CXR and BNP tomorrow.  Ccm time 30 min   Billy Fischer, MD ; [facility] [phone].  After 5:30 PM or weekends, call [phone]   Seen in conjunction with Benjamin Stain [facility] PGY-2 Pulmonary and Critical Care Medicine  [facility]  Pager: [phone]

## 2013-03-17 NOTE — Progress Notes (Signed)
patoent agitated on precedex gtt without fent gtt  Confirmed on camera exam  Plan Restart fent gtt Continue precedex gtt  Dr. Kalman Shan, M.D., [facility].C.P Pulmonary and Critical Care Medicine Staff Physician [facility] [place] Pulmonary and Critical Care Pager: [phone], If no answer or between  15:00h - 7:00h: call [phone]  03/17/2013 7:56 PM

## 2013-03-18 ENCOUNTER — Inpatient Hospital Stay (HOSPITAL_COMMUNITY): Payer: Medicare Other

## 2013-03-18 ENCOUNTER — Encounter: Payer: Medicare Other | Admitting: Cardiology

## 2013-03-18 LAB — GLUCOSE, CAPILLARY
Glucose-Capillary: 267 mg/dL — ABNORMAL HIGH (ref 70–99)
Glucose-Capillary: 301 mg/dL — ABNORMAL HIGH (ref 70–99)
Glucose-Capillary: 272 mg/dL — ABNORMAL HIGH (ref 70–99)
Glucose-Capillary: 180 mg/dL — ABNORMAL HIGH (ref 70–99)
Glucose-Capillary: 205 mg/dL — ABNORMAL HIGH (ref 70–99)
Glucose-Capillary: 256 mg/dL — ABNORMAL HIGH (ref 70–99)

## 2013-03-18 LAB — TRIGLYCERIDES: Triglycerides: 103 mg/dL (ref <150)

## 2013-03-18 LAB — CBC
HCT: 31.5 % — ABNORMAL LOW (ref 36.0–46.0)
Hemoglobin: 9.6 g/dL — ABNORMAL LOW (ref 12.0–15.0)
MCHC: 30.5 g/dL (ref 30.0–36.0)
Platelets: 242 10*3/uL (ref 150–400)
RBC: 3.23 MIL/uL — ABNORMAL LOW (ref 3.87–5.11)

## 2013-03-18 LAB — BASIC METABOLIC PANEL
BUN: 66 mg/dL — ABNORMAL HIGH (ref 6–23)
Calcium: 9.2 mg/dL (ref 8.4–10.5)
GFR calc Af Amer: 49 mL/min — ABNORMAL LOW (ref >90)
GFR calc non Af Amer: 43 mL/min — ABNORMAL LOW (ref >90)
Potassium: 4.2 mEq/L (ref 3.5–5.1)
Sodium: 150 mEq/L — ABNORMAL HIGH (ref 135–145)

## 2013-03-18 LAB — PROTIME-INR: Prothrombin Time: 20.9 seconds — ABNORMAL HIGH (ref 11.6–15.2)

## 2013-03-18 LAB — PRO B NATRIURETIC PEPTIDE: Pro B Natriuretic peptide (BNP): 7944 pg/mL — ABNORMAL HIGH (ref 0–125)

## 2013-03-18 MED ORDER — PRO-STAT SUGAR FREE PO LIQD
30.0000 mL | Freq: Three times a day (TID) | ORAL | Status: DC
  Administered 2013-03-19 – 2013-03-20 (×3): 30 mL
  Filled 2013-03-18 (×8): qty 30

## 2013-03-18 MED ORDER — INSULIN GLARGINE 100 UNIT/ML ~~LOC~~ SOLN
20.0000 [IU] | Freq: Every day | SUBCUTANEOUS | Status: DC
  Filled 2013-03-18: qty 0.2

## 2013-03-18 MED ORDER — VECURONIUM BROMIDE 10 MG IV SOLR
10.0000 mg | Freq: Once | INTRAVENOUS | Status: DC
  Filled 2013-03-18: qty 10

## 2013-03-18 MED ORDER — CARVEDILOL 6.25 MG PO TABS
6.2500 mg | ORAL_TABLET | Freq: Two times a day (BID) | ORAL | Status: DC
  Filled 2013-03-18 (×2): qty 1

## 2013-03-18 MED ORDER — LORAZEPAM 2 MG/ML IJ SOLN
0.5000 mg | Freq: Once | INTRAMUSCULAR | Status: AC
  Administered 2013-03-18: 0.5 mg via INTRAVENOUS

## 2013-03-18 MED ORDER — SODIUM CHLORIDE 0.9 % IV SOLN
100.0000 ug/h | INTRAVENOUS | Status: DC
  Administered 2013-03-18 (×2): 100 ug/h via INTRAVENOUS
  Administered 2013-03-19: 150 ug/h via INTRAVENOUS
  Filled 2013-03-18 (×3): qty 50

## 2013-03-18 MED ORDER — FUROSEMIDE 10 MG/ML IJ SOLN
60.0000 mg | Freq: Two times a day (BID) | INTRAMUSCULAR | Status: DC
Start: 2013-03-18 — End: 2013-03-19
  Administered 2013-03-18: 60 mg via INTRAVENOUS
  Filled 2013-03-18: qty 6

## 2013-03-18 MED ORDER — FENTANYL CITRATE 0.05 MG/ML IJ SOLN: 12.5000 ug | INTRAMUSCULAR | Status: DC | PRN

## 2013-03-18 MED ORDER — VITAL HIGH PROTEIN PO LIQD
1000.0000 mL | ORAL | Status: DC
  Administered 2013-03-19: 1000 mL
  Filled 2013-03-18 (×4): qty 1000

## 2013-03-18 MED ORDER — PROPOFOL 10 MG/ML IV EMUL
5.0000 ug/kg/min | Freq: Once | INTRAVENOUS | Status: DC
  Filled 2013-03-18: qty 100

## 2013-03-18 MED ORDER — WARFARIN SODIUM 10 MG PO TABS
10.0000 mg | ORAL_TABLET | Freq: Once | ORAL | Status: DC
  Filled 2013-03-18: qty 1

## 2013-03-18 MED ORDER — HYDRALAZINE HCL 20 MG/ML IJ SOLN
10.0000 mg | INTRAMUSCULAR | Status: DC | PRN
  Administered 2013-03-18 – 2013-03-23 (×5): 10 mg via INTRAVENOUS
  Filled 2013-03-18 (×5): qty 1

## 2013-03-18 MED ORDER — ETOMIDATE 2 MG/ML IV SOLN
40.0000 mg | Freq: Once | INTRAVENOUS | Status: DC
  Filled 2013-03-18: qty 20

## 2013-03-18 MED ORDER — MIDAZOLAM HCL 2 MG/2ML IJ SOLN
INTRAMUSCULAR | Status: AC
  Filled 2013-03-18: qty 2

## 2013-03-18 MED ORDER — CARVEDILOL 6.25 MG PO TABS
6.2500 mg | ORAL_TABLET | Freq: Two times a day (BID) | ORAL | Status: DC
  Administered 2013-03-19 – 2013-03-21 (×4): 6.25 mg
  Filled 2013-03-18 (×8): qty 1

## 2013-03-18 MED ORDER — MIDAZOLAM HCL 2 MG/2ML IJ SOLN: 4.0000 mg | Freq: Once | INTRAMUSCULAR | Status: DC

## 2013-03-18 MED ORDER — VITAL AF 1.2 CAL PO LIQD
1000.0000 mL | ORAL | Status: DC
  Filled 2013-03-18 (×2): qty 1000

## 2013-03-18 MED ORDER — RACEPINEPHRINE HCL 2.25 % IN NEBU
0.5000 mL | INHALATION_SOLUTION | Freq: Once | RESPIRATORY_TRACT | Status: AC
  Administered 2013-03-18: 0.5 mL via RESPIRATORY_TRACT
  Filled 2013-03-18: qty 0.5

## 2013-03-18 MED ORDER — LORAZEPAM 2 MG/ML IJ SOLN
INTRAMUSCULAR | Status: AC
  Administered 2013-03-18: 0.5 mg via INTRAVENOUS
  Filled 2013-03-18: qty 1

## 2013-03-18 MED ORDER — PROPOFOL 10 MG/ML IV EMUL
0.0000 ug/kg/min | INTRAVENOUS | Status: DC
  Administered 2013-03-18: 10 ug/kg/min via INTRAVENOUS
  Administered 2013-03-19 (×2): 40 ug/kg/min via INTRAVENOUS
  Administered 2013-03-19 (×4): 50 ug/kg/min via INTRAVENOUS
  Administered 2013-03-19: 40 ug/kg/min via INTRAVENOUS
  Administered 2013-03-20 (×2): 50 ug/kg/min via INTRAVENOUS
  Filled 2013-03-18 (×12): qty 100

## 2013-03-18 MED ORDER — POTASSIUM CHLORIDE CRYS ER 20 MEQ PO TBCR: 40.0000 meq | EXTENDED_RELEASE_TABLET | Freq: Once | ORAL | Status: DC

## 2013-03-18 MED ORDER — METHYLPREDNISOLONE SODIUM SUCC 125 MG IJ SOLR
80.0000 mg | Freq: Once | INTRAMUSCULAR | Status: AC
  Administered 2013-03-18: 80 mg via INTRAVENOUS
  Filled 2013-03-18: qty 1.28
  Filled 2013-03-18: qty 2

## 2013-03-18 MED ORDER — HEPARIN (PORCINE) IN NACL 100-0.45 UNIT/ML-% IJ SOLN
1600.0000 [IU]/h | INTRAMUSCULAR | Status: DC
  Administered 2013-03-18: 1350 [IU]/h via INTRAVENOUS
  Administered 2013-03-19 – 2013-03-20 (×2): 1500 [IU]/h via INTRAVENOUS
  Administered 2013-03-21 – 2013-03-23 (×3): 1600 [IU]/h via INTRAVENOUS
  Filled 2013-03-18 (×12): qty 250

## 2013-03-18 MED ORDER — LEVOTHYROXINE SODIUM 25 MCG PO TABS
25.0000 ug | ORAL_TABLET | Freq: Every day | ORAL | Status: DC
  Administered 2013-03-20 – 2013-03-24 (×5): 25 ug via ORAL
  Filled 2013-03-18 (×7): qty 1

## 2013-03-18 MED ORDER — DEXMEDETOMIDINE HCL IN NACL 400 MCG/100ML IV SOLN
0.2000 ug/kg/h | INTRAVENOUS | Status: DC
  Administered 2013-03-18 (×2): 0.6 ug/kg/h via INTRAVENOUS
  Filled 2013-03-18: qty 100

## 2013-03-18 MED ORDER — MIDAZOLAM HCL 2 MG/2ML IJ SOLN
2.0000 mg | Freq: Once | INTRAMUSCULAR | Status: AC
  Administered 2013-03-18: 2 mg via INTRAVENOUS

## 2013-03-18 MED ORDER — FENTANYL CITRATE 0.05 MG/ML IJ SOLN: 200.0000 ug | Freq: Once | INTRAMUSCULAR | Status: DC

## 2013-03-18 NOTE — Progress Notes (Signed)
Failed Extubation, increased work of breathing, tachycardia, tachypnea, Placed on bi-pap without success. Dr Sung Amabile informed. Patient given Racepinephrine HCl 2.25 % nebulizer solution 0.5 mL an d0.5 of ativan without improvement. Plan to reintubate per dr simonds

## 2013-03-18 NOTE — Progress Notes (Signed)
ANTICOAGULATION CONSULT NOTE -  Pharmacy Consult for coumadin Indication: afib  Allergies  Allergen Reactions  . Sulfonamide Derivatives Other (See Comments)    Unknown; "childhood allergy/mother"    Patient Measurements: Height: 5\' 4"  (162.6 cm) Weight: 269 lb 13.5 oz (122.4 kg) IBW/kg (Calculated) : 54.7 Heparin Dosing Weight: 84.5kg  Vital Signs: Temp: 97.4 F (36.3 C) (11/05 0816) Temp src: Oral (11/05 0816) BP: 107/46 mmHg (11/05 0814) Pulse Rate: 69 (11/05 0814)  Labs:  Recent Labs  03/16/13 0400 03/17/13 0430 03/18/13 0425  HGB 9.1*  --  9.6*  HCT 29.7*  --  31.5*  PLT 210  --  242  LABPROT 23.3* 21.8* 20.9*  INR 2.15* 1.97* 1.86*  CREATININE 1.31* 1.26* 1.25*    Estimated Creatinine Clearance: 54.1 ml/min (by C-G formula based on Cr of 1.25).  Medications:  Scheduled:  . albuterol  2.5 mg Nebulization Q6H  . antiseptic oral rinse  15 mL Mouth Rinse QID  . aspirin  81 mg Oral Daily  . budesonide (PULMICORT) nebulizer solution  0.5 mg Nebulization BID  . carvedilol  12.5 mg Per Tube BID WC  . chlorhexidine  15 mL Mouth Rinse BID  . famotidine  20 mg Per Tube BID  . insulin aspart  0-20 Units Subcutaneous Q4H  . insulin aspart  3 Units Subcutaneous Q4H  . ipratropium  0.5 mg Nebulization Q6H  . levothyroxine  25 mcg Per Tube QAC breakfast  . Warfarin - Pharmacist Dosing Inpatient   Does not apply q1800    Assessment: 72 yo female here with HF on coumadin PTA for afib (also history of mural thrombus). INR is trending down slightly. Will cont to try the higher end of home dose. Of note coumadin dosing 10/24-10/30 was 7.5mg -12.5mg .  Home coumadin regimen: 7.5 TTHSS, 5mg  MWF  Goal of Therapy:  INR= 2-3 Monitor platelets by anticoagulation protocol: Yes   Plan:  -Coumadin 10mg  PO x1 - Daily PT/INR

## 2013-03-18 NOTE — Procedures (Signed)
Extubation Procedure Note  Patient Details:   Name: Destiny [name] DOB: 08/24/1940 MRN: [id number]   Airway Documentation:  Airway 7.5 mm (Active)  Secured at (cm) 23 cm 03/18/2013  8:14 AM  Measured From Lips 03/18/2013  8:14 AM  Secured Location Right 03/18/2013  8:14 AM  Secured By [employer] 03/18/2013  8:14 AM  Tube Holder Repositioned Yes 03/18/2013  8:14 AM  Cuff Pressure (cm H2O) 23 cm H2O 03/18/2013  8:14 AM  Site Condition Dry 03/17/2013 11:25 PM    Evaluation  O2 sats: stable throughout Complications: No apparent complications Patient did tolerate procedure well. Bilateral Breath Sounds: Rhonchi Suctioning: Airway Yes  Ok Anis, MA 03/18/2013, 11:38 AM

## 2013-03-18 NOTE — Progress Notes (Signed)
Patient ambulated in room on the vent, ambulated to door. Tolerated fair.

## 2013-03-18 NOTE — Progress Notes (Signed)
ANTICOAGULATION CONSULT NOTE -  Pharmacy Consult for heparin Indication: afib  Allergies  Allergen Reactions  . Sulfonamide Derivatives Other (See Comments)    Unknown; "childhood allergy/mother"    Patient Measurements: Height: 5\' 4"  (162.6 cm) Weight: 269 lb 13.5 oz (122.4 kg) IBW/kg (Calculated) : 54.7 Heparin Dosing Weight: 84.5kg  Vital Signs: Temp: 97.4 F (36.3 C) (11/05 0816) Temp src: Oral (11/05 0816) BP: 149/65 mmHg (11/05 1246) Pulse Rate: 92 (11/05 1246)  Labs:  Recent Labs  03/16/13 0400 03/17/13 0430 03/18/13 0425  HGB 9.1*  --  9.6*  HCT 29.7*  --  31.5*  PLT 210  --  242  LABPROT 23.3* 21.8* 20.9*  INR 2.15* 1.97* 1.86*  CREATININE 1.31* 1.26* 1.25*    Estimated Creatinine Clearance: 54.1 ml/min (by C-G formula based on Cr of 1.25).  Medications:  Scheduled:  . albuterol  2.5 mg Nebulization Q6H  . antiseptic oral rinse  15 mL Mouth Rinse QID  . aspirin  81 mg Oral Daily  . budesonide (PULMICORT) nebulizer solution  0.5 mg Nebulization BID  . carvedilol  6.25 mg Per Tube BID WC  . chlorhexidine  15 mL Mouth Rinse BID  . furosemide  60 mg Intravenous BID  . insulin aspart  0-20 Units Subcutaneous Q4H  . insulin glargine  20 Units Subcutaneous Daily  . ipratropium  0.5 mg Nebulization Q6H  . [START ON 03/19/2013] levothyroxine  25 mcg Oral QAC breakfast  . potassium chloride  40 mEq Oral Once    Assessment: 72 yo female here with HF on coumadin PTA for afib (also history of mural thrombus). INR is close to therapeutic. Will transition to heparin no bolus to day. Pt is reintubated.   Goal of Therapy:  INR= 2-3 Monitor platelets by anticoagulation protocol: Yes   Plan:   Heparin drip at 1350 units/hr Check 8 hrs heparin level Daily heparin level and CBC

## 2013-03-18 NOTE — Progress Notes (Signed)
PULMONARY  / CRITICAL CARE MEDICINE  Name: Destiny [name] MRN: [id number] DOB: 1941-01-31    ADMISSION DATE:  03/05/2013 CONSULTATION DATE:  03/13/13  REFERRING MD : Robb Matar  PRIMARY SERVICE: [facility]-->PCCM   CHIEF COMPLAINT:  Acute resp failure   BRIEF PATIENT DESCRIPTION:  6 yof w/ mult co-morbids: AF, CHF (EF 35-40%), obesity, OSA. Admitted 10/23 w/ what was felt to be decompensated heart failure, +/- bronchitis and cellulitis. Initially got better from symptom stand-point. On 10/31 developed increased fever, increased work of breathing and delirium. PCCM asked to see for progressive respiratory failure.   SIGNIFICANT EVENTS / STUDIES:  ECHO 10/24: EF 35-40% gd II diastolic dysfxn 10/31- self extubation, reintubated, ABG 7.27pH, blood culture x 2, resp cx, and urine cx drawn 11/3 ABG pH 7.408         Diarrhea --> C diff negative         Continued diuresis and management of electrolytes 11/4 weaning on vent, discontinuing abx, discontinued fentanyl though restarted overnight 11/5 Failed SBT  LINES / TUBES: OETT 1031>>>self ext ETT>>>re-placed>>11/4 weaning>>11/5 failed STB and reintubated left IJ 10/31>>> Foley 10/31>>> NG 11/1>>>discontinued 11/5>>Replacing 11/5 NG/OG Rectal tube 11/3>>>  CULTURES: UC 10/28>>>Insignif growth final Respiratory culture 10/31>>>11/3 mod WBC, no org final BCX2 10/31>>> UC 10/31>>>no growth final  c difficile pcr 11/3>>>11/4 negative  ANTIBIOTICS: levaquin 10/30>>11/2 discontinued Zosyn 10/31>>10/31 discontinued vanc 10/30>>>11/2 discontinued cefipime 10/31>>>11/4 discontinued  SUBJECTIVE: Intubated, awake, follows basic commands this morning, motions towards ETT requesting it out. Proceeded to fail SBT and is reintubated.  VITAL SIGNS: Temp:  [98.9 F (37.2 C)-99.4 F (37.4 C)] 98.9 F (37.2 C) (11/05 0416) Pulse Rate:  [59-77] 59 (11/05 0700) Resp:  [13-30] 20 (11/05 0700) BP: (112-201)/(35-100) 145/35 mmHg (11/05 0700) SpO2:  [93  %-100 %] 93 % (11/05 0700) FiO2 (%):  [40 %] 40 % (11/05 0700) Weight:  [269 lb 13.5 oz (122.4 kg)] 269 lb 13.5 oz (122.4 kg) (11/05 0416) HEMODYNAMICS:   VENTILATOR SETTINGS: Vent Mode:  [-] PRVC FiO2 (%):  [40 %] 40 % Set Rate:  [15 bmp] 15 bmp Vt Set:  [440 mL] 440 mL PEEP:  [5 cmH20] 5 cmH20 Pressure Support:  [12 cmH20] 12 cmH20 Plateau Pressure:  [8 cmH20-23 cmH20] 8 cmH20 INTAKE / OUTPUT: Intake/Output     11/04 0701 - 11/05 0700 11/05 0701 - 11/06 0700   I.V. (mL/kg) 1624.6 (13.3)    NG/GT 1345    IV Piggyback 50    Total Intake(mL/kg) 3019.6 (24.7)    Urine (mL/kg/hr) 3665 (1.2)    Stool     Total Output 3665     Net -645.4            PHYSICAL EXAMINATION:  Gen: Obese, comfortable on vent, awake HEENT: NCAT, ETT in place, NG tube in place PULM: diminished anteriorly CV: RRR, distant heart sounds, 2+ bilateral DP pulses AB: BS+, soft, obese Ext: edema noted trace in LEs bilaterally Neuro: awake, moves extremities spontaneously, EOMI, follows basic commands  LABS: Reviewed and relevant labs documented in assessment and plan.   CXR:  11/2: Diffuse bilateral airspace disease appears increased  11/3: No significant change to diffuse bilateral airspace disease 11/4: Possibly minor improvement to diffuse airspace disease in LUL 11/5: No improvement/mild worsening of diffuse airspace disease  ASSESSMENT / PLAN:  PULMONARY A: Acute on chronic respiratory failure in setting of pulmonary edema +/- PNA/HCAP and CHF exac now with increasing BNP Decompensated OHS/OSA P:   11/4 was weaning but  11/5 failed SBT, reintubated Discussing tracheostomy placement with husband - re-visit discussion tomorrow Repeat pcxr and BNP in am  Continue lasix to 60mg  IV x 2 doses today, reeval tomorrow; Neg balance goals Repleting K cefipime discontinued 11/4 Maintain same MV on rest mode bronchodilators  CARDIOVASCULAR A:  Decompensated systolic (EF 35-40%) and diastolic heart  failure, downtrending BNP Pulmonary edema  CAF (on coumadin) P:  Diuresis two more doses today with K Afterload reduction Heparin per pharmacy with no bolus Holding coumadin 11/5 due to possible tracheostomy tomorrow  RENAL A:   Stage III CKD-->baseline scr 1.6-->1.7-->1.26-->1.25 Hyperkalemia -->s/p Kayexalate  P:   Cont lasix with K per tube Am chem Added D5 water 11/3 for hypernatremia  GASTROINTESTINAL A:   Obesity  P:   If intubated 2 days, restart stress ulcer ppx Tf to goal  HEMATOLOGIC A:   Anemia Therapeutic anticoagulation  P:  11/5 discontinue warfarin in preparation for possible tracheostomy plcmt tomorrow ppi Am cbc   INFECTIOUS A:   Doubt PNA P:   Discontinue cefipime 11/4 CXR in AM  ENDOCRINE A:   DM w/ hyperglycemia 72 units aspart dosed over 24 hours Hypothyroidism P:   Add lantus 20 units daily Continue ssi plus 3 units Q4 hours Cont thyroid replacement   NEUROLOGIC A:   Acute encephalopathy >mix of hypercarbia and hypoxia  P:   Supportive care WUA 11/4 precedex. Fentanyl restarted  TODAY'S SUMMARY:  Failed extubation today, reintubated, continue lasix with K, repeat CXR and BNP tomorrow.  Ccm time 40 min    Billy Fischer, MD ; [facility] [phone].  After 5:30 PM or weekends, call [phone]   Seen in conjunction with: [facility] PGY-2 Pulmonary and Critical Care Medicine  [facility]  Pager: [phone]

## 2013-03-18 NOTE — Procedures (Signed)
Intubation Procedure Note Destiny [name] [id number] Jan 25, 1941  Procedure: Intubation Indications: Respiratory insufficiency  Procedure Details Consent: Risks of procedure as well as the alternatives and risks of each were explained to the (patient/caregiver).  Consent for procedure obtained. Time Out: Verified patient identification, verified procedure, site/side was marked, verified correct patient position, special equipment/implants available, medications/allergies/relevent history reviewed, required imaging and test results available.  Performed  Maximum sterile technique was used including gloves, gown and hand hygiene.  MAC and 3    Evaluation Hemodynamic Status: BP stable throughout; O2 sats: stable throughout Patient's Current Condition: stable Complications: No apparent complications Patient did tolerate procedure well. Chest X-ray ordered to verify placement.  CXR: pending.  This procedure was performed under the direct supervision of Dr. Sung Amabile.  Forcucci, Courtney 03/18/2013  I was present for and supervised the entire procedure  Billy Fischer, MD ; [facility] [phone].  After 5:30 PM or weekends, call [phone]

## 2013-03-18 NOTE — Progress Notes (Signed)
NUTRITION CONSULT / FOLLOW UP  Intervention:    Once NGT is positioned appropriately, resume TF with Vital HP at 15 ml/h, increase by 10 ml every 4 hours to goal rate of 45 ml/h with Prostat 30 ml TID to provide 1380 kcals (25 kcals/kg ideal weight), 140 gm protein (100% of estimated needs), and 903 ml free water daily.  Nutrition Dx:   Inadequate oral intake related to inability to eat as evidenced by NPO status. Ongoing.  Goal:   Enteral nutrition to provide 60-70% of estimated calorie needs (22-25 kcals/kg ideal body weight) and 100% of estimated protein needs, based on ASPEN guidelines for permissive underfeeding in critically ill obese individuals. Progressing.  Monitor:   TF tolerance/adequacy, weight trend, labs, vent status.  Assessment:   Patient was admitted on 10/23 with what was felt to be decompensated heart failure, +/- bronchitis and cellulitis. Initially got better from symptom stand-point. On 10/31 developed increased fever, increased work of breathing and delirium.   Required intubation on 10/31. Failed trial of extubation earlier today. TF had been on hold for extubation this morning. Received consult to resume TF. NGT is not in a proper position per discussion with RN.  Patient is currently intubated on ventilator support. MD is discussing goals of care with family. MV: 8.1 L/min Temp:Temp (24hrs), Avg:98.6 F (37 C), Min:97.4 F (36.3 C), Max:99.2 F (37.3 C)  RN reports that watery stools continue. TF was changed to a semi-elemental formula.  Height: Ht Readings from Last 1 Encounters:  03/09/13 5\' 4"  (1.626 m)    Weight Status:   Wt Readings from Last 1 Encounters:  03/18/13 269 lb 13.5 oz (122.4 kg)  03/16/13  270 lb 15.1 oz (122.9 kg)  03/13/13  267 lb 10.2 oz (121.4 kg)   Re-estimated needs:  Kcal: 1830 Protein: 136 gm Fluid: 1.8 L  Skin: no problems  Diet Order:  NPO  Previous TF Order: Vital HP at 55 ml/h to provide 1320 kcals (24 kcals/kg  ideal weight), 116 gm protein (84% of estimated needs), and 1104 ml free water daily.    Intake/Output Summary (Last 24 hours) at 03/18/13 1335 Last data filed at 03/18/13 1031  Gross per 24 hour  Intake 2552.51 ml  Output   3065 ml  Net -512.49 ml    Last BM: 11/4 (diarrhea)   Labs:   Recent Labs Lab 03/16/13 0400 03/17/13 0430 03/18/13 0425  NA 149* 150* 150*  K 3.5 3.8 4.2  CL 109 110 110  CO2 35* 34* 33*  BUN 83* 73* 66*  CREATININE 1.31* 1.26* 1.25*  CALCIUM 9.0 9.4 9.2  GLUCOSE 209* 222* 340*    CBG (last 3)   Recent Labs  03/18/13 0422 03/18/13 0811 03/18/13 1136  GLUCAP 301* 272* 180*    Scheduled Meds: . albuterol  2.5 mg Nebulization Q6H  . antiseptic oral rinse  15 mL Mouth Rinse QID  . aspirin  81 mg Oral Daily  . budesonide (PULMICORT) nebulizer solution  0.5 mg Nebulization BID  . carvedilol  6.25 mg Per Tube BID WC  . chlorhexidine  15 mL Mouth Rinse BID  . furosemide  60 mg Intravenous BID  . insulin aspart  0-20 Units Subcutaneous Q4H  . ipratropium  0.5 mg Nebulization Q6H  . [START ON 03/19/2013] levothyroxine  25 mcg Oral QAC breakfast  . potassium chloride  40 mEq Oral Once    Continuous Infusions: . dexmedetomidine 0.6 mcg/kg/hr (03/18/13 1224)  . dextrose 50 mL/hr  at 03/18/13 0858  . feeding supplement (VITAL AF 1.2 CAL)    . fentaNYL infusion INTRAVENOUS 100 mcg/hr (03/18/13 1327)  . heparin      Joaquin Courts, RD, LDN, CNSC Pager [phone] After Hours Pager [phone]

## 2013-03-18 NOTE — Progress Notes (Signed)
Called Elink concerning patient being hypertensive. Spoke with [employer].

## 2013-03-19 ENCOUNTER — Inpatient Hospital Stay (HOSPITAL_COMMUNITY): Payer: Medicare Other

## 2013-03-19 ENCOUNTER — Encounter (HOSPITAL_COMMUNITY): Payer: Medicare Other

## 2013-03-19 LAB — GLUCOSE, CAPILLARY
Glucose-Capillary: 225 mg/dL — ABNORMAL HIGH (ref 70–99)
Glucose-Capillary: 251 mg/dL — ABNORMAL HIGH (ref 70–99)
Glucose-Capillary: 275 mg/dL — ABNORMAL HIGH (ref 70–99)
Glucose-Capillary: 288 mg/dL — ABNORMAL HIGH (ref 70–99)

## 2013-03-19 LAB — BASIC METABOLIC PANEL
BUN: 67 mg/dL — ABNORMAL HIGH (ref 6–23)
CO2: 33 mEq/L — ABNORMAL HIGH (ref 19–32)
Chloride: 109 mEq/L (ref 96–112)
Creatinine, Ser: 1.22 mg/dL — ABNORMAL HIGH (ref 0.50–1.10)
GFR calc Af Amer: 51 mL/min — ABNORMAL LOW (ref >90)
GFR calc non Af Amer: 44 mL/min — ABNORMAL LOW (ref >90)

## 2013-03-19 LAB — CBC
HCT: 33.2 % — ABNORMAL LOW (ref 36.0–46.0)
Hemoglobin: 10.3 g/dL — ABNORMAL LOW (ref 12.0–15.0)
MCH: 30 pg (ref 26.0–34.0)
Platelets: 257 10*3/uL (ref 150–400)
RBC: 3.43 MIL/uL — ABNORMAL LOW (ref 3.87–5.11)
WBC: 12.5 10*3/uL — ABNORMAL HIGH (ref 4.0–10.5)

## 2013-03-19 LAB — CULTURE, BLOOD (ROUTINE X 2): Culture: NO GROWTH

## 2013-03-19 LAB — PROTIME-INR: INR: 1.94 — ABNORMAL HIGH (ref 0.00–1.49)

## 2013-03-19 LAB — HEPARIN LEVEL (UNFRACTIONATED)
Heparin Unfractionated: 0.24 IU/mL — ABNORMAL LOW (ref 0.30–0.70)
Heparin Unfractionated: 0.38 IU/mL (ref 0.30–0.70)

## 2013-03-19 MED ORDER — INSULIN GLARGINE 100 UNIT/ML ~~LOC~~ SOLN
10.0000 [IU] | Freq: Every day | SUBCUTANEOUS | Status: DC
  Filled 2013-03-19 (×2): qty 0.1

## 2013-03-19 MED ORDER — FUROSEMIDE 10 MG/ML IJ SOLN
80.0000 mg | Freq: Two times a day (BID) | INTRAMUSCULAR | Status: AC
  Administered 2013-03-19 (×2): 80 mg via INTRAVENOUS
  Filled 2013-03-19 (×2): qty 8

## 2013-03-19 MED ORDER — DEXTROSE 5 % IV SOLN
INTRAVENOUS | Status: DC
  Administered 2013-03-19 – 2013-03-20 (×2): via INTRAVENOUS

## 2013-03-19 NOTE — Progress Notes (Addendum)
ANTICOAGULATION CONSULT NOTE - Follow Up Consult  Pharmacy Consult for Heparin Indication: atrial fibrillation  Allergies  Allergen Reactions  . Sulfonamide Derivatives Other (See Comments)    Unknown; "childhood allergy/mother"   Patient Measurements: Height: 5\' 4"  (162.6 cm) Weight: 265 lb 3.4 oz (120.3 kg) IBW/kg (Calculated) : 54.7 Heparin Dosing Weight: 84.5 kg Vital Signs: Temp: 98 F (36.7 C) (11/06 1150) Temp src: Oral (11/06 1150) BP: 134/45 mmHg (11/06 1233) Pulse Rate: 64 (11/06 1233) Labs:  Recent Labs  03/17/13 0430 03/18/13 0425 03/18/13 2331 03/19/13 0520  HGB  --  9.6*  --  10.3*  HCT  --  31.5*  --  33.2*  PLT  --  242  --  257  LABPROT 21.8* 20.9*  --  21.6*  INR 1.97* 1.86*  --  1.94*  HEPARINUNFRC  --   --  0.24* 0.38  CREATININE 1.26* 1.25*  --  1.22*   Estimated Creatinine Clearance: 54.8 ml/min (by C-G formula based on Cr of 1.22).  Assessment: 72 YOF on IV heparin for Afib. Heparin level was therapeutic this AM on 1500 units/hr.   Goal of Therapy:  Heparin level 0.3-0.7 units/ml Monitor platelets by anticoagulation protocol: Yes   Plan:  1. Continue heparin at 1500 units/hr.  2. Check heparin level to confirm.  3. Daily heparin level and CBC while on therapy. 4. Monitor for signs and symptoms of bleeding.   Link Snuffer, PharmD, BCPS Clinical Pharmacist [phone] 03/19/2013,2:30 PM  Addendum: Repeat heparin level remains therapeutic.   Plan: Continue Heparin at 1500 units/hr. Will continue to follow daily levels and CBC.  JBM, 4:54 PM

## 2013-03-19 NOTE — Progress Notes (Signed)
12:26 AM  Heparin per rx for afib.  HL is 0.24 on 1350/hr with no complications.   Increase to 1500 units/hr and f/u am labs  Jaselyn Coffin

## 2013-03-19 NOTE — Progress Notes (Signed)
Pharmacist Heart Failure Core Measure Documentation  Assessment: Destiny [name] has an EF documented as 35-40% on 03/09/13 by ECHO.  Rationale: Heart failure patients with left ventricular systolic dysfunction (LVSD) and an EF < 40% should be prescribed an angiotensin converting enzyme inhibitor (ACEI) or angiotensin receptor blocker (ARB) at discharge unless a contraindication is documented in the medical record.  This patient is not currently on an ACEI or ARB for HF.  This note is being placed in the record in order to provide documentation that a contraindication to the use of these agents is present for this encounter.  ACE Inhibitor or Angiotensin Receptor Blocker is contraindicated (specify all that apply)  []   ACEI allergy AND ARB allergy []   Angioedema []   Moderate or severe aortic stenosis []   Hyperkalemia []   Hypotension []   Renal artery stenosis []   Worsening renal function, preexisting renal disease or dysfunction  *03/19/13- Renal function is improving. Patient currently not taking oral medications. Patient on ACE inhibitor prior to admission.  Consider resuming ACE inhibitor when able.   Fayne Norrie 03/19/2013 3:32 PM

## 2013-03-19 NOTE — Progress Notes (Signed)
11-6/14 0637  Hold pt 0700 synthroid dose until pt gets Panda tube placement today. Pharmacist Christiane Ha made aware.   Elisha Headland RN

## 2013-03-19 NOTE — Progress Notes (Signed)
PT Cancellation Note  Patient Details Name: Destiny [name] MRN: [id number] DOB: [birth year]   Cancelled Treatment:    Reason Eval/Treat Not Completed: Medical issues which prohibited therapy (RN recommended holding today) .  Per RN, pt restless and needed increased sedation.      Rollene Rotunda Loudon Krakow, PT, DPT [phone]   03/19/2013, 11:16 AM

## 2013-03-19 NOTE — Progress Notes (Signed)
PULMONARY  / CRITICAL CARE MEDICINE  Name: LATAYNA RITCHIE MRN: [id number] DOB: August 05, 1940    ADMISSION DATE:  03/05/2013 CONSULTATION DATE:  03/13/13  REFERRING MD : Robb Matar  PRIMARY SERVICE: [facility]-->PCCM   CHIEF COMPLAINT:  Acute resp failure   BRIEF PATIENT DESCRIPTION:  36 yof w/ mult co-morbids: AF, CHF (EF 35-40%), obesity, OSA. Admitted 10/23 w/ what was felt to be decompensated heart failure, +/- bronchitis and cellulitis. Initially got better from symptom stand-point. On 10/31 developed increased fever, increased work of breathing and delirium. PCCM asked to see for progressive respiratory failure.   SIGNIFICANT EVENTS / STUDIES:  ECHO 10/24: EF 35-40% gd II diastolic dysfxn 10/31- self extubation, reintubated, ABG 7.27pH, blood culture x 2, resp cx, and urine cx drawn 11/3 ABG pH 7.408         Diarrhea --> C diff negative         Continued diuresis and management of electrolytes 11/4 weaning on vent, discontinuing abx, discontinued fentanyl though restarted overnight 11/5 Failed SBT 11/6 Having GOC discussion with family, increasing lasix  LINES / TUBES: OETT 1031>>>self ext ETT>>>re-placed>>11/4 weaning>>11/5 failed STB and reintubated CVC triple lumen 10/31>>> Foley 10/31>>> NG 11/1>>>discontinued 11/5>>Replacing 11/6 OG Rectal tube 11/3>>>  CULTURES: UC 10/28>>>Insignif growth final Respiratory culture 10/31>>>11/3 mod WBC, no org final BCX2 10/31>>> UC 10/31>>>no growth final  c difficile pcr 11/3>>>11/4 negative  ANTIBIOTICS: levaquin 10/30>>11/2 discontinued Zosyn 10/31>>10/31 discontinued vanc 10/30>>>11/2 discontinued cefipime 10/31>>>11/4 discontinued  SUBJECTIVE: Intubated and asleep, wakes appropriately and follows commands  VITAL SIGNS: Temp:  [97.4 F (36.3 C)-99.8 F (37.7 C)] 97.7 F (36.5 C) (11/06 0321) Pulse Rate:  [59-125] 66 (11/06 0600) Resp:  [11-37] 11 (11/06 0600) BP: (107-192)/(44-135) 192/66 mmHg (11/06 0648) SpO2:  [91 %-100 %]  100 % (11/06 0600) FiO2 (%):  [40 %-100 %] 50 % (11/06 0400) Weight:  [265 lb 3.4 oz (120.3 kg)] 265 lb 3.4 oz (120.3 kg) (11/06 0500) HEMODYNAMICS:   VENTILATOR SETTINGS: Vent Mode:  [-] PRVC FiO2 (%):  [40 %-100 %] 50 % Set Rate:  [8 bmp-15 bmp] 15 bmp Vt Set:  [440 mL] 440 mL PEEP:  [5 cmH20] 5 cmH20 Pressure Support:  [5 cmH20] 5 cmH20 Plateau Pressure:  [12 cmH20-28 cmH20] 19 cmH20 INTAKE / OUTPUT: Intake/Output     11/05 0701 - 11/06 0700 11/06 0701 - 11/07 0700   I.V. (mL/kg) 1946.3 (16.2)    Other 75    NG/GT 193    IV Piggyback     Total Intake(mL/kg) 2214.3 (18.4)    Urine (mL/kg/hr) 3000 (1)    Stool 400 (0.1)    Total Output 3400     Net -1185.7            PHYSICAL EXAMINATION:  Gen: Obese, comfortable on vent, asleep but wakes appropriately HEENT: NCAT, ETT in place PULM: CTAB anteriorly CV: RRR, distant heart sounds, 2+ bilateral DP pulses AB: BS+, soft, obese Ext: edema noted trace in LEs bilaterally Neuro: awake, moves extremities spontaneously, EOMI, follows basic commands  LABS: Reviewed and relevant labs documented in assessment and plan.   CXR:  11/2: Diffuse bilateral airspace disease appears increased  11/3: No significant change to diffuse bilateral airspace disease 11/4: Possibly minor improvement to diffuse airspace disease in LUL 11/5: No improvement/mild worsening of diffuse airspace disease 11/6: No improvement of diffuse airspace disease  ASSESSMENT / PLAN:  PULMONARY A: Acute on chronic respiratory failure in setting of pulmonary edema +/- PNA/HCAP and CHF exac now  with increasing BNP Decompensated OHS/OSA P:   11/5 reintubated after failing 3rd SBT Discussing tracheostomy placement with husband - re-visit discussion today Repeat pcxr and BNP in am  Increase lasix to 80mg  IV x 2 doses today, reeval tomorrow; Neg balance goals Repleting K Maintain same MV on rest mode bronchodilators  CARDIOVASCULAR A:  Decompensated  systolic (EF 35-40%) and diastolic heart failure, downtrending BNP Pulmonary edema  CAF (on coumadin) P:  Increased diuresis per above, dosing K Afterload reduction Heparin per pharmacy with no bolus Holding coumadin 11/5 due to possible tracheostomy, pending family discussion  RENAL A:   Stage III CKD-->baseline scr 1.6>>>>1.26>>1.22 Hyperkalemia -->s/p Kayexalate  P:   Cont lasix with K per tube Am chem Added D5 water 11/3 for hypernatremia  GASTROINTESTINAL A:   Obesity  P:   If intubated 2 days, restart stress ulcer ppx Tf to goal per OG tube that is to be placed today  HEMATOLOGIC A:   Anemia Therapeutic anticoagulation  P:  11/5 discontinue warfarin in preparation for possible tracheostomy plcmt  ppi Am cbc   INFECTIOUS A:   Doubt PNA P:   Discontinue cefipime 11/4 CXR in AM  ENDOCRINE A:   DM w/ hyperglycemia, BG 300s Hypothyroidism P:   Add lantus 10 units daily (not added yest as suspected) Continue ssi plus 3 units Q4 hours Cont thyroid replacement   NEUROLOGIC A:   Acute encephalopathy >mix of hypercarbia and hypoxia  P:   Supportive care WUA 11/4 precedex. Fentanyl restarted  TODAY'S SUMMARY:  Increased lasix to 80mg  IV BID, continue K, repeat CXR and BNP tomorrow, goals of care with family done today, awaiting family decision on tracheostomy vs retry extubation when able. Pending family decision, would consider consulting palliative care for home hospice.   Simone Curia  Family Practice PGY-2 03/19/2013 1:01 PM    PCCM ATTENDING: I have interviewed and examined the patient and reviewed the database. I have formulated the assessment and plan as reflected in the note above with amendments made by me. 45 mins of direct critical care time provided  I spoke in detail with pt's husband and 3 sons. I explained our options going forward including 1) optimizing her resp status and extubating with plans for no re-intubation and comfort  measures if she fails extubation, vs 2) trach tube placement. Her husband indicates that she has previously expressed desire to forgo prolonged life support. We will proceed with option 1.   Billy Fischer, MD;  PCCM service; Mobile [phone]

## 2013-03-20 ENCOUNTER — Inpatient Hospital Stay (HOSPITAL_COMMUNITY): Payer: Medicare Other

## 2013-03-20 LAB — GLUCOSE, CAPILLARY
Glucose-Capillary: 131 mg/dL — ABNORMAL HIGH (ref 70–99)
Glucose-Capillary: 190 mg/dL — ABNORMAL HIGH (ref 70–99)
Glucose-Capillary: 211 mg/dL — ABNORMAL HIGH (ref 70–99)
Glucose-Capillary: 226 mg/dL — ABNORMAL HIGH (ref 70–99)
Glucose-Capillary: 301 mg/dL — ABNORMAL HIGH (ref 70–99)
Glucose-Capillary: 306 mg/dL — ABNORMAL HIGH (ref 70–99)

## 2013-03-20 LAB — BASIC METABOLIC PANEL
CO2: 31 mEq/L (ref 19–32)
Calcium: 8.9 mg/dL (ref 8.4–10.5)
Creatinine, Ser: 1.39 mg/dL — ABNORMAL HIGH (ref 0.50–1.10)
GFR calc non Af Amer: 37 mL/min — ABNORMAL LOW (ref >90)
Glucose, Bld: 199 mg/dL — ABNORMAL HIGH (ref 70–99)
Potassium: 3.4 mEq/L — ABNORMAL LOW (ref 3.5–5.1)
Sodium: 147 mEq/L — ABNORMAL HIGH (ref 135–145)

## 2013-03-20 LAB — HEPARIN LEVEL (UNFRACTIONATED): Heparin Unfractionated: 0.52 IU/mL (ref 0.30–0.70)

## 2013-03-20 LAB — PROTIME-INR
INR: 2.2 — ABNORMAL HIGH (ref 0.00–1.49)
Prothrombin Time: 23.7 seconds — ABNORMAL HIGH (ref 11.6–15.2)

## 2013-03-20 LAB — PRO B NATRIURETIC PEPTIDE: Pro B Natriuretic peptide (BNP): 5660 pg/mL — ABNORMAL HIGH (ref 0–125)

## 2013-03-20 MED ORDER — FUROSEMIDE 10 MG/ML IJ SOLN
80.0000 mg | Freq: Two times a day (BID) | INTRAMUSCULAR | Status: DC
  Administered 2013-03-20: 80 mg via INTRAVENOUS
  Filled 2013-03-20: qty 8

## 2013-03-20 MED ORDER — POTASSIUM CHLORIDE 10 MEQ/50ML IV SOLN
10.0000 meq | INTRAVENOUS | Status: AC
  Administered 2013-03-20 (×4): 10 meq via INTRAVENOUS
  Filled 2013-03-20: qty 50

## 2013-03-20 MED ORDER — POTASSIUM CHLORIDE 20 MEQ/15ML (10%) PO LIQD
ORAL | Status: AC
  Administered 2013-03-20: 40 meq via ORAL
  Filled 2013-03-20: qty 30

## 2013-03-20 MED ORDER — INSULIN GLARGINE 100 UNIT/ML ~~LOC~~ SOLN
15.0000 [IU] | Freq: Every day | SUBCUTANEOUS | Status: DC
  Filled 2013-03-20: qty 0.15

## 2013-03-20 MED ORDER — POTASSIUM CHLORIDE 2 MEQ/ML IV SOLN
INTRAVENOUS | Status: DC
  Administered 2013-03-20: 15:00:00 via INTRAVENOUS
  Filled 2013-03-20 (×2): qty 1000

## 2013-03-20 MED ORDER — ENALAPRIL MALEATE 10 MG PO TABS
10.0000 mg | ORAL_TABLET | Freq: Every day | ORAL | Status: DC
  Administered 2013-03-20 – 2013-03-23 (×4): 10 mg via ORAL
  Filled 2013-03-20 (×5): qty 1

## 2013-03-20 MED ORDER — PANTOPRAZOLE SODIUM 40 MG IV SOLR
40.0000 mg | Freq: Every day | INTRAVENOUS | Status: DC
  Administered 2013-03-20 – 2013-03-21 (×2): 40 mg via INTRAVENOUS
  Filled 2013-03-20 (×2): qty 40

## 2013-03-20 MED ORDER — FUROSEMIDE 10 MG/ML IJ SOLN
60.0000 mg | Freq: Two times a day (BID) | INTRAMUSCULAR | Status: DC
  Administered 2013-03-20 – 2013-03-21 (×2): 60 mg via INTRAVENOUS
  Filled 2013-03-20 (×4): qty 6

## 2013-03-20 MED ORDER — POTASSIUM CHLORIDE 20 MEQ/15ML (10%) PO LIQD
40.0000 meq | Freq: Once | ORAL | Status: DC
  Filled 2013-03-20: qty 30

## 2013-03-20 MED ORDER — FENTANYL CITRATE 0.05 MG/ML IJ SOLN
12.5000 ug | INTRAMUSCULAR | Status: DC | PRN
  Administered 2013-03-20 – 2013-03-24 (×4): 25 ug via INTRAVENOUS
  Filled 2013-03-20 (×4): qty 2

## 2013-03-20 MED ORDER — FUROSEMIDE 10 MG/ML IJ SOLN: 60.0000 mg | Freq: Three times a day (TID) | INTRAMUSCULAR | Status: DC

## 2013-03-20 MED ORDER — DEXMEDETOMIDINE HCL IN NACL 200 MCG/50ML IV SOLN
0.2000 ug/kg/h | INTRAVENOUS | Status: DC
  Administered 2013-03-20: 0.5 ug/kg/h via INTRAVENOUS
  Administered 2013-03-20: 0.4 ug/kg/h via INTRAVENOUS
  Administered 2013-03-20: 0.2 ug/kg/h via INTRAVENOUS
  Administered 2013-03-20: 0.5 ug/kg/h via INTRAVENOUS
  Administered 2013-03-21: 0.7 ug/kg/h via INTRAVENOUS
  Administered 2013-03-21: 0.5 ug/kg/h via INTRAVENOUS
  Administered 2013-03-21: 0.7 ug/kg/h via INTRAVENOUS
  Filled 2013-03-20 (×8): qty 50

## 2013-03-20 MED ORDER — METHYLPREDNISOLONE SODIUM SUCC 125 MG IJ SOLR
80.0000 mg | Freq: Once | INTRAMUSCULAR | Status: AC
  Administered 2013-03-20: 80 mg via INTRAVENOUS
  Filled 2013-03-20: qty 1.28
  Filled 2013-03-20: qty 2

## 2013-03-20 MED ORDER — POTASSIUM CHLORIDE 20 MEQ/15ML (10%) PO LIQD
40.0000 meq | Freq: Once | ORAL | Status: AC
  Administered 2013-03-20: 40 meq via ORAL
  Filled 2013-03-20: qty 30

## 2013-03-20 NOTE — Progress Notes (Signed)
Wasted 75 cc of fentanyl from 250cc bag. Wasted with Clydia Llano

## 2013-03-20 NOTE — Progress Notes (Signed)
PT Cancellation Note  Patient Details Name: SHERESE HEYWARD MRN: [id number] DOB: [birth year]   Cancelled Treatment:    Reason Eval/Treat Not Completed: Medical issues which prohibited therapy.  Pt being considered for comfort measures.  Nursing asked this PT to sign off.  Will sign off at this time.  Will need reorder if pt status changes.  Thanks.   INGOLD,Nason Conradt 03/20/2013, 9:03 AM [facility] [phone] (pager)

## 2013-03-20 NOTE — Progress Notes (Signed)
eLink Physician-Brief Progress Note Patient Name: Destiny [name] DOB: [birth year] MRN: [id number]  Date of Service  03/20/2013   HPI/Events of Note     eICU Interventions  Potassium    Intervention Category Intermediate Interventions: Electrolyte abnormality - evaluation and management  Destiny [name] S. 03/20/2013, 6:04 AM

## 2013-03-20 NOTE — Progress Notes (Signed)
PULMONARY  / CRITICAL CARE MEDICINE  Name: Destiny [name] MRN: [id number] DOB: Jun 08, 1940    ADMISSION DATE:  03/05/2013 CONSULTATION DATE:  03/13/13  REFERRING MD : Robb Matar  PRIMARY SERVICE: [facility]-->PCCM   CHIEF COMPLAINT:  Acute resp failure   BRIEF PATIENT DESCRIPTION:  34 yof w/ mult co-morbids: AF, CHF (EF 35-40%), obesity, OSA. Admitted 10/23 w/ what was felt to be decompensated heart failure, +/- bronchitis and cellulitis. Initially got better from symptom stand-point. On 10/31 developed increased fever, increased work of breathing and delirium. PCCM asked to see for progressive respiratory failure.   SIGNIFICANT EVENTS / STUDIES:  ECHO 10/24: EF 35-40% gd II diastolic dysfxn 10/31 self extubation, failed 11/5 passed SBT, failed extubation 11/6 Goals of care conv with huband and son. Optimize medically and extubate. No re-intubation 11/7 Passed SBT  LINES / TUBES: ETT 1031>> 11/01 (self ext),  11/01 >> 11/05 (planned),  11/05 >>  L IJ CVL 10/31 >>   CULTURES: Urine 10/28>> NEG Resp 10/31>> NEG Blood 10/31>> NEG Urine 10/31>> NEG C diff  pcr 11/3>> NEG  ANTIBIOTICS: levaquin 10/30 >> 11/2 Zosyn 10/31 >> 10/31  vanc 10/30 >> 11/2  cefipime 10/31 >> 11/4   SUBJECTIVE:  Passed SBT. Extubated and tolerating. Mild stridor post extubation.   VITAL SIGNS: Temp:  [95.9 F (35.5 C)-98 F (36.7 C)] 97.6 F (36.4 C) (11/07 0342) Pulse Rate:  [60-84] 60 (11/07 0600) Resp:  [13-22] 13 (11/07 0600) BP: (112-178)/(38-65) 114/40 mmHg (11/07 0600) SpO2:  [88 %-100 %] 96 % (11/07 0600) FiO2 (%):  [30 %-40 %] 40 % (11/07 0334) Weight:  [270 lb 15.1 oz (122.9 kg)] 270 lb 15.1 oz (122.9 kg) (11/07 0500) HEMODYNAMICS:   VENTILATOR SETTINGS: Vent Mode:  [-] PRVC FiO2 (%):  [30 %-40 %] 40 % Set Rate:  [15 bmp] 15 bmp Vt Set:  [440 mL] 440 mL PEEP:  [5 cmH20] 5 cmH20 Plateau Pressure:  [14 cmH20-29 cmH20] 14 cmH20 INTAKE / OUTPUT: Intake/Output     11/06 0701 - 11/07 0700  11/07 0701 - 11/08 0700   I.V. (mL/kg) 2782.2 (22.6)    Other 100    NG/GT 765    Total Intake(mL/kg) 3647.2 (29.7)    Urine (mL/kg/hr) 2260 (0.8)    Stool     Total Output 2260     Net +1387.2           PHYSICAL EXAMINATION:  Gen: NAD on SBT HEENT: WNL PULM: dependent crackles, no wheezes CV: RRR s M AB: BS+, soft, obese Ext: tr symmetric edema Neuro: no focal deficits  LABS: Reviewed and relevant labs documented in assessment and plan.  CXR: LLV, edema pattern  ASSESSMENT / PLAN:  PULMONARY A: Acute on chronic respiratory failure pulmonary edema  Doubt PNA OHS/OSA Mild post extubation stridor P:   Extubated 11/07 Monitor in ICU post extubation Methylpred X 1 11/07 Empiric BDs  CARDIOVASCULAR A:  Combined CHF CAF P:  Diurese to extent permitted by BP and renal function Cont anticoagulation > transition to warfarin if/when appropriate Resume ACEI 11/07  RENAL A:   CKD Hyperkalemia, resolved  hypokalemia P:   Monitor BMET intermittently Correct electrolytes as indicated   GASTROINTESTINAL A:   Obesity  P:   SUP: IV PPI NPO post extubation  HEMATOLOGIC A:   Mild anemia without acute blood loss Therapeutic anticoagulation  P:  DVT px: full dose heparin (AF) Cont heparin Monitor CBC intermittently  INFECTIOUS A:   Doubt PNA P:  Micro and abx as above  ENDOCRINE A:   DM II, inadequately controlled Hypothyroidism P:   Cont CBGs/SSI  NEUROLOGIC A:   Acute encephalopathy, resolved Anxiety ICU assoc discomfort P:   Cont dexmedetomidine for now Low dose fentanyl Full comfort if develops significant distress  Family updated @ bedside Goal: home with hospice 50 mins CCM time    Billy Fischer, MD ; [facility] service Mobile [phone].  After 5:30 PM or weekends, call [phone]  Seen in conjunction with: Simone Curia  [facility] Practice PGY-2 03/20/2013 8:03 AM

## 2013-03-20 NOTE — Procedures (Signed)
Extubation Procedure Note  Patient Details:   Name: Destiny [name] DOB: 05-23-1940 MRN: [id number]   Airway Documentation:     Evaluation  O2 sats: stable throughout and currently acceptable Complications: No apparent complications Patient did tolerate procedure well. Bilateral Breath Sounds: Clear Suctioning: Airway Yes  Prior to extubation: pt suctioned orally and via ETT.  Post-extubation: Pt able to state her name, cough to produce sputum, no stridor noted.  Antoine Poche 03/20/2013, 12:32 PM

## 2013-03-20 NOTE — Progress Notes (Signed)
ANTICOAGULATION CONSULT NOTE - Follow Up Consult  Pharmacy Consult for Heparin Indication: atrial fibrillation  Allergies  Allergen Reactions  . Sulfonamide Derivatives Other (See Comments)    Unknown; "childhood allergy/mother"   Patient Measurements: Height: 5\' 4"  (162.6 cm) Weight: 270 lb 15.1 oz (122.9 kg) IBW/kg (Calculated) : 54.7 Heparin Dosing Weight: 84.5 kg Vital Signs: Temp: 98.1 F (36.7 C) (11/07 1156) Temp src: Oral (11/07 1156) BP: 114/40 mmHg (11/07 0600) Pulse Rate: 60 (11/07 0600) Labs:  Recent Labs  03/18/13 0425  03/19/13 0520 03/19/13 1505 03/20/13 0345  HGB 9.6*  --  10.3*  --   --   HCT 31.5*  --  33.2*  --   --   PLT 242  --  257  --   --   LABPROT 20.9*  --  21.6*  --  23.7*  INR 1.86*  --  1.94*  --  2.20*  HEPARINUNFRC  --   < > 0.38 0.35 0.52  CREATININE 1.25*  --  1.22*  --  1.39*  < > = values in this interval not displayed. Estimated Creatinine Clearance: 48.8 ml/min (by C-G formula based on Cr of 1.39).  Assessment: 72 YOF on IV heparin for Afib. Heparin level was therapeutic this AM on 1500 units/hr.  No bleeding or complications noted.  Plans to resume Coumadin in a few days.  Of note, INR up today to 2.2 despite no Coumadin doses since 11/4.  Goal of Therapy:  Heparin level 0.3-0.7 units/ml Monitor platelets by anticoagulation protocol: Yes   Plan:  1. Continue heparin at 1500 units/hr.  2. Daily heparin level and CBC while on therapy. 3. Monitor for signs and symptoms of bleeding.   Tad Moore, BCPS  Clinical Pharmacist Pager [phone]  03/20/2013 1:25 PM

## 2013-03-21 ENCOUNTER — Inpatient Hospital Stay (HOSPITAL_COMMUNITY): Payer: Medicare Other

## 2013-03-21 LAB — GLUCOSE, CAPILLARY
Glucose-Capillary: 179 mg/dL — ABNORMAL HIGH (ref 70–99)
Glucose-Capillary: 210 mg/dL — ABNORMAL HIGH (ref 70–99)
Glucose-Capillary: 217 mg/dL — ABNORMAL HIGH (ref 70–99)
Glucose-Capillary: 316 mg/dL — ABNORMAL HIGH (ref 70–99)

## 2013-03-21 LAB — CBC
HCT: 33.2 % — ABNORMAL LOW (ref 36.0–46.0)
Hemoglobin: 11 g/dL — ABNORMAL LOW (ref 12.0–15.0)
MCHC: 33.1 g/dL (ref 30.0–36.0)
MCV: 93 fL (ref 78.0–100.0)
RDW: 14.6 % (ref 11.5–15.5)
WBC: 15.3 10*3/uL — ABNORMAL HIGH (ref 4.0–10.5)

## 2013-03-21 LAB — PROTIME-INR
INR: 1.3 (ref 0.00–1.49)
Prothrombin Time: 15.9 seconds — ABNORMAL HIGH (ref 11.6–15.2)

## 2013-03-21 LAB — HEPARIN LEVEL (UNFRACTIONATED): Heparin Unfractionated: 0.43 IU/mL (ref 0.30–0.70)

## 2013-03-21 LAB — BASIC METABOLIC PANEL
BUN: 71 mg/dL — ABNORMAL HIGH (ref 6–23)
Calcium: 9.2 mg/dL (ref 8.4–10.5)
GFR calc Af Amer: 51 mL/min — ABNORMAL LOW (ref >90)
GFR calc non Af Amer: 44 mL/min — ABNORMAL LOW (ref >90)
Potassium: 3.9 mEq/L (ref 3.5–5.1)
Sodium: 144 mEq/L (ref 135–145)

## 2013-03-21 MED ORDER — INSULIN ASPART 100 UNIT/ML ~~LOC~~ SOLN
0.0000 [IU] | Freq: Three times a day (TID) | SUBCUTANEOUS | Status: DC
  Administered 2013-03-21: 5 [IU] via SUBCUTANEOUS
  Administered 2013-03-21 – 2013-03-22 (×2): 3 [IU] via SUBCUTANEOUS
  Administered 2013-03-22: 5 [IU] via SUBCUTANEOUS
  Administered 2013-03-22: 8 [IU] via SUBCUTANEOUS
  Administered 2013-03-23: 5 [IU] via SUBCUTANEOUS
  Administered 2013-03-23: 8 [IU] via SUBCUTANEOUS
  Administered 2013-03-23 – 2013-03-24 (×2): 3 [IU] via SUBCUTANEOUS
  Administered 2013-03-24: 5 [IU] via SUBCUTANEOUS

## 2013-03-21 MED ORDER — LORAZEPAM 0.5 MG PO TABS
0.5000 mg | ORAL_TABLET | Freq: Every evening | ORAL | Status: DC | PRN
  Administered 2013-03-21 – 2013-03-23 (×3): 0.5 mg via ORAL
  Filled 2013-03-21 (×3): qty 1

## 2013-03-21 MED ORDER — CARVEDILOL 6.25 MG PO TABS
6.2500 mg | ORAL_TABLET | Freq: Two times a day (BID) | ORAL | Status: DC
  Administered 2013-03-21 – 2013-03-24 (×7): 6.25 mg via ORAL
  Filled 2013-03-21 (×8): qty 1

## 2013-03-21 MED ORDER — FUROSEMIDE 10 MG/ML IJ SOLN
60.0000 mg | Freq: Every day | INTRAMUSCULAR | Status: DC
  Administered 2013-03-22: 60 mg via INTRAVENOUS
  Filled 2013-03-21: qty 6

## 2013-03-21 MED ORDER — INSULIN ASPART 100 UNIT/ML ~~LOC~~ SOLN
3.0000 [IU] | Freq: Three times a day (TID) | SUBCUTANEOUS | Status: DC
  Administered 2013-03-21 – 2013-03-24 (×10): 3 [IU] via SUBCUTANEOUS

## 2013-03-21 MED ORDER — WARFARIN SODIUM 5 MG PO TABS
5.0000 mg | ORAL_TABLET | Freq: Once | ORAL | Status: AC
  Administered 2013-03-21: 5 mg via ORAL
  Filled 2013-03-21: qty 1

## 2013-03-21 MED ORDER — INSULIN ASPART 100 UNIT/ML ~~LOC~~ SOLN
0.0000 [IU] | Freq: Every day | SUBCUTANEOUS | Status: DC
  Administered 2013-03-21: 2 [IU] via SUBCUTANEOUS

## 2013-03-21 MED ORDER — POTASSIUM CHLORIDE CRYS ER 20 MEQ PO TBCR
20.0000 meq | EXTENDED_RELEASE_TABLET | Freq: Every day | ORAL | Status: DC
  Administered 2013-03-21 – 2013-03-24 (×4): 20 meq via ORAL
  Filled 2013-03-21 (×6): qty 1

## 2013-03-21 MED ORDER — AMLODIPINE BESYLATE 5 MG PO TABS
5.0000 mg | ORAL_TABLET | Freq: Every day | ORAL | Status: DC
  Administered 2013-03-21 – 2013-03-24 (×4): 5 mg via ORAL
  Filled 2013-03-21 (×4): qty 1

## 2013-03-21 MED ORDER — WARFARIN - PHARMACIST DOSING INPATIENT: Freq: Every day | Status: DC

## 2013-03-21 NOTE — Progress Notes (Signed)
Pt. Confused, pullled out [facility]. Catheter tip noted to be intact. 2 PIV placed, and medications restarted.

## 2013-03-21 NOTE — Progress Notes (Signed)
ANTICOAGULATION CONSULT NOTE - Follow Up Consult  Pharmacy Consult for heparin Indication: atrial fibrillation   Labs:  Recent Labs  03/19/13 0520 03/19/13 1505 03/20/13 0345 03/21/13 0330  HGB 10.3*  --   --   --   HCT 33.2*  --   --   --   PLT 257  --   --   --   LABPROT 21.6*  --  23.7*  --   INR 1.94*  --  2.20*  --   HEPARINUNFRC 0.38 0.35 0.52 0.22*  CREATININE 1.22*  --  1.39* 1.22*    Assessment: 72yo female now subtherapeutic on heparin; pt had pulled her lines but heparin was only off x57min and had been running without issue afterwards x5hr prior to lab draw.  Goal of Therapy:  Heparin level 0.3-0.7 units/ml   Plan:  Will increase heparin gtt slightly to 1600 units/hr and check level in 6-8hr.  Vernard Gambles, PharmD, BCPS  03/21/2013,4:56 AM

## 2013-03-21 NOTE — Progress Notes (Signed)
PULMONARY  / CRITICAL CARE MEDICINE  Name: Destiny [name] MRN: [id number] DOB: 01-01-1941    ADMISSION DATE:  03/05/2013 CONSULTATION DATE:  03/13/13  REFERRING MD : Robb Matar  PRIMARY SERVICE: [facility]-->PCCM   CHIEF COMPLAINT:  Acute resp failure   BRIEF PATIENT DESCRIPTION:  73 yof w/ mult co-morbids: AF, CHF (EF 35-40%), obesity, OSA. Admitted 10/23 w/ what was felt to be decompensated heart failure, +/- bronchitis and cellulitis. Initially got better from symptom stand-point. On 10/31 developed increased fever, increased work of breathing and delirium. PCCM asked to see for progressive respiratory failure.   SIGNIFICANT EVENTS / STUDIES:  ECHO 10/24: EF 35-40% gd II diastolic dysfxn 10/31 self extubation, failed 11/5 passed SBT, failed extubation 11/6 Goals of care conv with huband and son. Optimize medically and extubate. No re-intubation 11/7 Passed SBT. Extubated and looks good.  11/08 looks good without distress. Cognition intact  LINES / TUBES: ETT  1031>> 11/01 (self ext),    11/01 >> 11/05 (planned)  11/05 >> 11/07 L IJ CVL 10/31 >> 11/07  CULTURES: Urine 10/28>> NEG Resp 10/31>> NEG Blood 10/31>> NEG Urine 10/31>> NEG C diff  pcr 11/3>> NEG  ANTIBIOTICS: levaquin 10/30 >> 11/2 Zosyn 10/31 >> 10/31  vanc 10/30 >> 11/2  cefepime 10/31 >> 11/4   SUBJECTIVE:  No distress, no complaints   VITAL SIGNS: Temp:  [97.6 F (36.4 C)-99.2 F (37.3 C)] 97.9 F (36.6 C) (11/08 1242) Pulse Rate:  [60-79] 76 (11/08 1400) Resp:  [12-24] 21 (11/08 1400) BP: (86-168)/(41-106) 168/53 mmHg (11/08 1445) SpO2:  [95 %-100 %] 98 % (11/08 1400) HEMODYNAMICS:   VENTILATOR SETTINGS:   INTAKE / OUTPUT: Intake/Output     11/07 0701 - 11/08 0700 11/08 0701 - 11/09 0700   I.V. (mL/kg) 1989.7 (16.2) 328.2 (2.7)   Other     NG/GT     Total Intake(mL/kg) 1989.7 (16.2) 328.2 (2.7)   Urine (mL/kg/hr) 3400 (1.2) 1235 (1.3)   Stool  50 (0.1)   Total Output 3400 1285   Net -1410.3  -956.8         PHYSICAL EXAMINATION:  Gen: NAD HEENT: WNL PULM: dependent crackles, no wheezes CV: RRR s M AB: BS+, soft, obese Ext: tr symmetric edema Neuro: no focal deficits  LABS: Reviewed and relevant labs documented in assessment and plan.  CXR: mild IS edema pattern  ASSESSMENT / PLAN:  PULMONARY A: Acute on chronic respiratory failure pulmonary edema  OHS/OSA Mild post extubation stridor, resolved P:   Cont supplemental O2 Cont empiric BDs  CARDIOVASCULAR A:  Combined CHF CAF Hypertension P:  Resume amlodipine 11/08 Resume warfarin per pharm Cont heparin until warfarin therapeutic  RENAL A:   CKD Hyperkalemia, resolved  Hypokalemia, resolved Hypervolemia, improving P:   Monitor BMET intermittently Correct electrolytes as indicated Scheduled furosemide and K+ ordered  GASTROINTESTINAL A:   Obesity  P:   SUP: N/I post extubatio Begin CHO mod diet 11/08  HEMATOLOGIC A:   Mild anemia without acute blood loss Therapeutic anticoagulation  P:  DVT px: full anticoagulation (AF) Cont heparin and warfarin per pharm Monitor CBC intermittently  INFECTIOUS A:   No acute issues P:   Micro and abx as above  ENDOCRINE A:   DM II, improved controlled Hypothyroidism P:   Cont CBGs/SSI Cont levothyroxine  NEUROLOGIC A:   Acute encephalopathy, resolved Anxiety, improving ICU assoc discomfort P:   Dexmedetomidine DC'd Low dose PRN fentanyl  Transfer to SDU. Remains on PCCM  Family updated @  bedside Goal: home with hospice 50 mins CCM time    Billy Fischer, MD ; [facility] [phone].  After 5:30 PM or weekends, call [phone]

## 2013-03-21 NOTE — Progress Notes (Signed)
RT helped place patient on her home cpap, via nasal pillows, with 4lpm 02 bleed in. Patient is tolerating cpap well at this time.

## 2013-03-21 NOTE — Progress Notes (Signed)
ANTICOAGULATION CONSULT NOTE - Follow Up Consult  Pharmacy Consult for Heparin/Coumadin Indication: atrial fibrillation  Allergies  Allergen Reactions  . Sulfonamide Derivatives Other (See Comments)    Unknown; "childhood allergy/mother"   Patient Measurements: Height: 5\' 4"  (162.6 cm) Weight: 270 lb 15.1 oz (122.9 kg) IBW/kg (Calculated) : 54.7 Heparin Dosing Weight: 84.5 kg Vital Signs: Temp: 98.3 F (36.8 C) (11/08 1505) Temp src: Oral (11/08 1505) BP: 174/58 mmHg (11/08 1505) Pulse Rate: 77 (11/08 1505) Labs:  Recent Labs  03/19/13 0520  03/20/13 0345 03/21/13 0330 03/21/13 1345  HGB 10.3*  --   --   --   --   HCT 33.2*  --   --   --   --   PLT 257  --   --   --   --   LABPROT 21.6*  --  23.7*  --  15.9*  INR 1.94*  --  2.20*  --  1.30  HEPARINUNFRC 0.38  < > 0.52 0.22* 0.43  CREATININE 1.22*  --  1.39* 1.22*  --   < > = values in this interval not displayed. Estimated Creatinine Clearance: 55.5 ml/min (by C-G formula based on Cr of 1.22).  Assessment: 72 YOF on IV heparin for Afib. Heparin level was therapeutic on 1600 units/hr.  No bleeding or complications noted.  Pharmacy asked to resume Coumadin today as well, INR now down to 1.3, last dose of Coumadin 11/4.  PTA Coumadin dosing was 7.5 mg daily except 5 mg on MWF.    Goal of Therapy:  Heparin level 0.3-0.7 units/ml INR 2-3 Monitor platelets by anticoagulation protocol: Yes   Plan:  1. Continue heparin at 1600 units/hr.  2. Daily heparin level and CBC while on therapy. 3. Coumadin 5 mg po x 1 tonight. 4. Daily PT/INR. 5. Monitor for signs and symptoms of bleeding.   Tad Moore, BCPS  Clinical Pharmacist Pager [phone]  03/21/2013 3:59 PM

## 2013-03-22 LAB — PROTIME-INR
INR: 1.19 (ref 0.00–1.49)
Prothrombin Time: 14.8 seconds (ref 11.6–15.2)

## 2013-03-22 LAB — GLUCOSE, CAPILLARY
Glucose-Capillary: 256 mg/dL — ABNORMAL HIGH (ref 70–99)
Glucose-Capillary: 161 mg/dL — ABNORMAL HIGH (ref 70–99)
Glucose-Capillary: 195 mg/dL — ABNORMAL HIGH (ref 70–99)

## 2013-03-22 LAB — HEPARIN LEVEL (UNFRACTIONATED): Heparin Unfractionated: 0.5 IU/mL (ref 0.30–0.70)

## 2013-03-22 MED ORDER — BUDESONIDE 0.25 MG/2ML IN SUSP
0.2500 mg | Freq: Two times a day (BID) | RESPIRATORY_TRACT | Status: DC
  Administered 2013-03-23 – 2013-03-24 (×3): 0.25 mg via RESPIRATORY_TRACT
  Filled 2013-03-22 (×6): qty 2

## 2013-03-22 MED ORDER — ALBUTEROL SULFATE (5 MG/ML) 0.5% IN NEBU
2.5000 mg | INHALATION_SOLUTION | Freq: Four times a day (QID) | RESPIRATORY_TRACT | Status: DC
  Administered 2013-03-22 – 2013-03-24 (×8): 2.5 mg via RESPIRATORY_TRACT
  Filled 2013-03-22 (×9): qty 0.5

## 2013-03-22 MED ORDER — WARFARIN SODIUM 7.5 MG PO TABS
7.5000 mg | ORAL_TABLET | Freq: Once | ORAL | Status: AC
  Administered 2013-03-22: 7.5 mg via ORAL
  Filled 2013-03-22: qty 1

## 2013-03-22 MED ORDER — TORSEMIDE 20 MG PO TABS
20.0000 mg | ORAL_TABLET | Freq: Two times a day (BID) | ORAL | Status: DC
  Administered 2013-03-22 – 2013-03-24 (×5): 20 mg via ORAL
  Filled 2013-03-22 (×6): qty 1

## 2013-03-22 MED ORDER — INSULIN GLARGINE 100 UNIT/ML ~~LOC~~ SOLN
20.0000 [IU] | Freq: Every day | SUBCUTANEOUS | Status: DC
  Administered 2013-03-22 – 2013-03-23 (×2): 20 [IU] via SUBCUTANEOUS
  Filled 2013-03-22 (×2): qty 0.2

## 2013-03-22 NOTE — Progress Notes (Signed)
Patient placed her home cpap on. Patient is tolerating cpap well at this time.

## 2013-03-22 NOTE — Progress Notes (Signed)
PULMONARY  / CRITICAL CARE MEDICINE  Name: Destiny [name] MRN: [id number] DOB: 25-Oct-1940    ADMISSION DATE:  03/05/2013 CONSULTATION DATE:  03/13/13  REFERRING MD : Robb Matar  PRIMARY SERVICE: [facility]-->PCCM   CHIEF COMPLAINT:  Acute resp failure   BRIEF PATIENT DESCRIPTION:  75 yof w/ mult co-morbids: AF, CHF (EF 35-40%), obesity, OSA. Admitted 10/23 w/ what was felt to be decompensated heart failure, +/- bronchitis and cellulitis. Initially got better from symptom stand-point. On 10/31 developed increased fever, increased work of breathing and delirium. PCCM asked to see for progressive respiratory failure.   SIGNIFICANT EVENTS / STUDIES:  ECHO 10/24: EF 35-40% gd II diastolic dysfxn 10/31 self extubation, failed 11/5 passed SBT, failed extubation 11/6 Goals of care conv with huband and son. Optimize medically and extubate. No re-intubation 11/7 Passed SBT. Extubated and looks good.  11/08 looks good without distress. Cognition intact. Trf to SDU  LINES / TUBES: ETT  1031>> 11/01 (self ext),    11/01 >> 11/05 (planned)  11/05 >> 11/07 L IJ CVL 10/31 >> 11/07  CULTURES: Urine 10/28>> NEG Resp 10/31>> NEG Blood 10/31>> NEG Urine 10/31>> NEG C diff  pcr 11/3>> NEG  ANTIBIOTICS: levaquin 10/30 >> 11/2 Zosyn 10/31 >> 10/31  vanc 10/30 >> 11/2  cefepime 10/31 >> 11/4   SUBJECTIVE:  No distress, no complaints   VITAL SIGNS: Temp:  [97.9 F (36.6 C)-98.8 F (37.1 C)] 98.1 F (36.7 C) (11/09 1200) Pulse Rate:  [73-106] 80 (11/09 1200) Resp:  [13-28] 20 (11/09 1200) BP: (141-184)/(41-82) 156/54 mmHg (11/09 1200) SpO2:  [91 %-98 %] 94 % (11/09 1200) HEMODYNAMICS:   VENTILATOR SETTINGS:   INTAKE / OUTPUT: Intake/Output     11/08 0701 - 11/09 0700 11/09 0701 - 11/10 0700   I.V. (mL/kg) 630.2 (5.1)    Total Intake(mL/kg) 630.2 (5.1)    Urine (mL/kg/hr) 1235 (0.4) 150 (0.2)   Stool 50 (0)    Total Output 1285 150   Net -654.8 -150        Urine Occurrence 1 x    Stool Occurrence 1 x     PHYSICAL EXAMINATION:  Gen: NAD HEENT: WNL PULM: dependent crackles, no wheezes CV: RRR s M AB: BS+, soft, obese Ext: tr symmetric edema Neuro: no focal deficits  LABS: Reviewed and relevant labs documented in assessment and plan.  CXR: mild IS edema pattern  ASSESSMENT / PLAN:  PULMONARY A: Acute on chronic respiratory failure pulmonary edema, compensated OHS/OSA Mild post extubation stridor, resolved P:   Cont supplemental O2 Cont empiric BDs Cont nebulized steroids  CARDIOVASCULAR A:  Combined CHF CAF Hypertension P:  Cont amlodipine Cont ACEI Cont heparin > warfarin per pharm  RENAL A:   CKD Hyperkalemia, resolved  Hypokalemia, resolved Hypervolemia, improving P:   Monitor BMET intermittently Correct electrolytes as indicated Change IV furosemide to prior dose of torsemide Cont supplemental K+  GASTROINTESTINAL A:   Obesity  P:   SUP: N/I post extubation Cont CHO mod diet  HEMATOLOGIC A:   Mild anemia without acute blood loss Therapeutic anticoagulation  P:  DVT px: full anticoagulation (AF) Monitor CBC intermittently  INFECTIOUS A:   No acute issues P:   Micro and abx as above  ENDOCRINE A:   DM II, inadequately controlled Hypothyroidism P:   Add Lantus Cont CBGs/SSI Cont levothyroxine  NEUROLOGIC A:   Acute encephalopathy, resolved Anxiety, resolved ICU assoc discomfort, resolved Chronic deconditioning P:   Cont low dose PRN fentanyl Palliative Care  consulted to assist with transition to home with Hospice Cont PT/OT She should go home with continued PT Given current goals, SNF, LTAC and CIR are not options  I confirmed DNR/DNI status with patient Family/pt updated @ bedside  Billy Fischer, MD ; [facility] service Mobile [phone].  After 5:30 PM or weekends, call [phone]

## 2013-03-22 NOTE — Progress Notes (Signed)
ANTICOAGULATION CONSULT NOTE - Follow Up Consult  Pharmacy Consult for Heparin and Coumaidn Indication: atrial fibrillation  Allergies  Allergen Reactions  . Sulfonamide Derivatives Other (See Comments)    Unknown; "childhood allergy/mother"    Patient Measurements: Height: 5\' 4"  (162.6 cm) Weight: 270 lb 15.1 oz (122.9 kg) IBW/kg (Calculated) : 54.7 Heparin Dosing Weight: 84.5kg  Vital Signs: Temp: 97.9 F (36.6 C) (11/09 0400) Temp src: Oral (11/09 0400) BP: 184/62 mmHg (11/09 0600) Pulse Rate: 106 (11/09 0600)  Labs:  Recent Labs  03/20/13 0345 03/21/13 0330 03/21/13 1345 03/21/13 1645 03/22/13 0545  HGB  --   --   --  11.0*  --   HCT  --   --   --  33.2*  --   PLT  --   --   --  288  --   LABPROT 23.7*  --  15.9*  --  14.8  INR 2.20*  --  1.30  --  1.19  HEPARINUNFRC 0.52 0.22* 0.43  --  0.50  CREATININE 1.39* 1.22*  --   --   --     Estimated Creatinine Clearance: 55.5 ml/min (by C-G formula based on Cr of 1.22).   Medications:  Heparin @ 1600 units/hr  Assessment: 72yof continues on IV heparin to coumadin bridge for afib. Heparin level is therapeutic. Coumadin was resumed yesterday and INR actually decreased to 1.19 today. CBC is stable. No bleeding reported.  Home coumadin dose: 7.5mg  daily except 5mg  MWF  Goal of Therapy:  INR 2-3 Heparin level 0.3-0.7 Monitor platelets by anticoagulation protocol: Yes   Plan:  1) Continue heparin at 1600 units/hr 2) Increase coumadin to 7.5mg  x 1 3) Heparin level, INR, CBC in AM  Fredrik Rigger 03/22/2013,7:55 AM

## 2013-03-23 DIAGNOSIS — R5381 Other malaise: Secondary | ICD-10-CM

## 2013-03-23 DIAGNOSIS — I509 Heart failure, unspecified: Secondary | ICD-10-CM

## 2013-03-23 DIAGNOSIS — N183 Chronic kidney disease, stage 3 (moderate): Secondary | ICD-10-CM

## 2013-03-23 DIAGNOSIS — I2589 Other forms of chronic ischemic heart disease: Secondary | ICD-10-CM

## 2013-03-23 DIAGNOSIS — I5043 Acute on chronic combined systolic (congestive) and diastolic (congestive) heart failure: Secondary | ICD-10-CM

## 2013-03-23 DIAGNOSIS — E669 Obesity, unspecified: Secondary | ICD-10-CM

## 2013-03-23 DIAGNOSIS — R040 Epistaxis: Secondary | ICD-10-CM

## 2013-03-23 LAB — PROTIME-INR: Prothrombin Time: 15.3 seconds — ABNORMAL HIGH (ref 11.6–15.2)

## 2013-03-23 LAB — CBC
HCT: 33.5 % — ABNORMAL LOW (ref 36.0–46.0)
MCH: 29.9 pg (ref 26.0–34.0)
MCHC: 31.9 g/dL (ref 30.0–36.0)
MCV: 93.6 fL (ref 78.0–100.0)
Platelets: 237 10*3/uL (ref 150–400)
RDW: 14.8 % (ref 11.5–15.5)
WBC: 10.8 10*3/uL — ABNORMAL HIGH (ref 4.0–10.5)

## 2013-03-23 LAB — GLUCOSE, CAPILLARY

## 2013-03-23 LAB — BASIC METABOLIC PANEL
Calcium: 8.9 mg/dL (ref 8.4–10.5)
Chloride: 101 mEq/L (ref 96–112)
Creatinine, Ser: 0.98 mg/dL (ref 0.50–1.10)
GFR calc Af Amer: 66 mL/min — ABNORMAL LOW (ref >90)
GFR calc non Af Amer: 57 mL/min — ABNORMAL LOW (ref >90)
Sodium: 141 mEq/L (ref 135–145)

## 2013-03-23 LAB — HEPARIN LEVEL (UNFRACTIONATED): Heparin Unfractionated: 0.48 IU/mL (ref 0.30–0.70)

## 2013-03-23 MED ORDER — INSULIN GLARGINE 100 UNIT/ML ~~LOC~~ SOLN
30.0000 [IU] | Freq: Every day | SUBCUTANEOUS | Status: DC
  Administered 2013-03-24: 30 [IU] via SUBCUTANEOUS
  Filled 2013-03-23: qty 0.3

## 2013-03-23 MED ORDER — PHENYLEPH-SHARK LIV OIL-MO-PET 0.25-3-14-71.9 % RE OINT
1.0000 "application " | TOPICAL_OINTMENT | Freq: Two times a day (BID) | RECTAL | Status: DC | PRN
  Filled 2013-03-23: qty 28.4

## 2013-03-23 MED ORDER — GLUCERNA SHAKE PO LIQD
237.0000 mL | Freq: Every day | ORAL | Status: DC
  Administered 2013-03-23 – 2013-03-24 (×2): 237 mL via ORAL

## 2013-03-23 MED ORDER — INSULIN GLARGINE 100 UNIT/ML ~~LOC~~ SOLN
10.0000 [IU] | Freq: Once | SUBCUTANEOUS | Status: AC
  Administered 2013-03-23: 10 [IU] via SUBCUTANEOUS
  Filled 2013-03-23: qty 0.1

## 2013-03-23 MED ORDER — GABAPENTIN 100 MG PO CAPS
200.0000 mg | ORAL_CAPSULE | Freq: Two times a day (BID) | ORAL | Status: DC
  Administered 2013-03-23 – 2013-03-24 (×3): 200 mg via ORAL
  Filled 2013-03-23 (×4): qty 2

## 2013-03-23 MED ORDER — WARFARIN SODIUM 7.5 MG PO TABS
7.5000 mg | ORAL_TABLET | Freq: Once | ORAL | Status: AC
  Administered 2013-03-23: 7.5 mg via ORAL
  Filled 2013-03-23: qty 1

## 2013-03-23 NOTE — Progress Notes (Signed)
ANTICOAGULATION CONSULT NOTE - Follow Up Consult  Pharmacy Consult:  Heparin / Coumaidn Indication: atrial fibrillation  Allergies  Allergen Reactions  . Sulfonamide Derivatives Other (See Comments)    Unknown; "childhood allergy/mother"    Patient Measurements: Height: 5\' 4"  (162.6 cm) Weight: 270 lb 15.1 oz (122.9 kg) IBW/kg (Calculated) : 54.7 Heparin Dosing Weight: 85 kg  Vital Signs: Temp: 97.8 F (36.6 C) (11/10 0839) Temp src: Oral (11/10 0839) BP: 160/65 mmHg (11/10 0839) Pulse Rate: 84 (11/10 1012)  Labs:  Recent Labs  03/21/13 0330 03/21/13 1345 03/21/13 1645 03/22/13 0545 03/23/13 0345  HGB  --   --  11.0*  --  10.7*  HCT  --   --  33.2*  --  33.5*  PLT  --   --  288  --  237  LABPROT  --  15.9*  --  14.8 15.3*  INR  --  1.30  --  1.19 1.24  HEPARINUNFRC 0.22* 0.43  --  0.50 0.48  CREATININE 1.22*  --   --   --  0.98    Estimated Creatinine Clearance: 69.1 ml/min (by C-G formula based on Cr of 0.98).      Assessment: 69 YOF with Afib to continue on IV heparin bridge while INR is sub-therapeutic on Coumadin.  Heparin level remains therapeutic and stable.  INR improved minimally.  No bleeding reported.   Goal of Therapy:  INR 2-3 Heparin level 0.3-0.7 Monitor platelets by anticoagulation protocol: Yes    Plan:  - Continue heparin at 1600 units/hr - Repeat Coumadin 7.5mg  PO today - Daily HL / CBC / PT / INR    Brittanny Levenhagen D. Laney Potash, PharmD, BCPS Pager:  [phone] 03/23/2013, 10:51 AM

## 2013-03-23 NOTE — Progress Notes (Signed)
NUTRITION FOLLOW UP  Intervention:    Glucerna Shake daily (220 kcals, 10 gm protein per 8 fl oz bottle) RD to follow for nutrition care plan  New Nutrition Dx:   Increased nutrient needs related to deconditioning, rehabilitation as evidenced by estimated nutrition needs, ongoing  New Goal:   Pt to meet >/= 90% of their estimated nutrition needs, progressing  Monitor:   PO & supplemental intake, weight, labs, I/O's  Assessment:   Patient was admitted on 10/23 with what was felt to be decompensated heart failure, +/- bronchitis and cellulitis. Initially got better from symptom stand-point. On 10/31 developed increased fever, increased work of breathing and delirium.   Patient extubated 11/5.  Vital HP formula discontinued via NGT with extubation.  Transferred from [facility] to [facility] Stepdown 11/8.   Diet advanced to Clear Liquids 11/7, Carbohydrate Modified Medium Calorie 11/8.  Patient reports she ate lunch today.  No % PO intake available per flowsheet records. Palliative Care Team involved.  PT recommending [facility].  Would benefit from addition of nutrition supplements -- RD to order.  Height: Ht Readings from Last 1 Encounters:  03/09/13 5\' 4"  (1.626 m)    Weight Status:   Wt Readings from Last 1 Encounters:  03/20/13 270 lb 15.1 oz (122.9 kg)    Re-estimated needs:  Kcal: 1700-1900 Protein: 90-100 gm Fluid: 1.7-1.9 L  Skin: Intact  Diet Order: Carb Control   Intake/Output Summary (Last 24 hours) at 03/23/13 1509 Last data filed at 03/23/13 1431  Gross per 24 hour  Intake    320 ml  Output    501 ml  Net   -181 ml    Labs:   Recent Labs Lab 03/20/13 0345 03/21/13 0330 03/23/13 0345  NA 147* 144 141  K 3.4* 3.9 3.7  CL 106 103 101  CO2 31 30 31   BUN 73* 71* 51*  CREATININE 1.39* 1.22* 0.98  CALCIUM 8.9 9.2 8.9  GLUCOSE 199* 255* 214*    CBG (last 3)   Recent Labs  03/22/13 2127 03/23/13 0808 03/23/13 1202  GLUCAP 195* 208* 258*     Scheduled Meds: . albuterol  2.5 mg Nebulization QID  . amLODipine  5 mg Oral Daily  . aspirin  81 mg Oral Daily  . budesonide (PULMICORT) nebulizer solution  0.25 mg Nebulization BID  . carvedilol  6.25 mg Oral BID WC  . enalapril  10 mg Oral Daily  . gabapentin  200 mg Oral BID  . insulin aspart  0-15 Units Subcutaneous TID WC  . insulin aspart  0-5 Units Subcutaneous QHS  . insulin aspart  3 Units Subcutaneous TID WC  . insulin glargine  10 Units Subcutaneous Once  . [START ON 03/24/2013] insulin glargine  30 Units Subcutaneous Daily  . levothyroxine  25 mcg Oral QAC breakfast  . potassium chloride  20 mEq Oral Daily  . torsemide  20 mg Oral BID  . warfarin  7.5 mg Oral ONCE-1800  . Warfarin - Pharmacist Dosing Inpatient   Does not apply q1800    Continuous Infusions: . heparin 1,600 Units/hr (03/23/13 1317)    Maureen Chatters, RD, LDN Pager #: [phone] After-Hours Pager #: [phone]

## 2013-03-23 NOTE — Consult Note (Signed)
Physical Medicine and Rehabilitation Consult  Reason for Consult: Deconditioning Referring Physician: Dr. Marin Shutter   HPI: Destiny [name] is a 72 y.o. 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  She was treated for acute on chronic CHF with IV diuretics and improved. Was noted to have cellulitis LLE and started on levaquin. Diuretics discontinued due to acute on chronic renal failure. On 03/13/13, patient developed recurrent hypoxia with fever and  lethargy requiring intubation for acute respiratory failure. She was started on IV antibiotics for PNA/HCAP and treated with IV diuretics for pulmonary edema. Was started on IV heparin and transition to coumadin for A fib. She was able to tolerated extubation on 03/20/13. Palliative care consulted for GOC and patient has elected DNR, no trach and no repeat ICU admission. PT re-evaluation done today and patient noted to be deconditioned. MD, PT recommending CIR.    Review of Systems  Respiratory: Positive for cough and shortness of breath.   Neurological: Positive for focal weakness.    Past Medical History  Diagnosis Date  . LBBB (left bundle branch block)   . Carotid bruit     a. 10/19/2011 carotid duplex - Mild hard plaque bilaterally. Stable 40-59% bilateral ICA stenosis.   Marland Kitchen Dyslipidemia   . Hypertension   . Chronic combined systolic and diastolic CHF (congestive heart failure)     a. EF as low as 25% in 2006;  b. EF 60-65% in 12/2011;  c. 02/2013 Echo: EF 35-40, mod mid-dist antsept HK, Gr 2 DD, mild LVH.  . Back pain, chronic     "just when I walk; mass on 3rd and 4th vertebrae right lower back"  . Cardiomyopathy, ischemic     a. 2012 s/p SJM 3231-40 Uni BiV ICD, ser # [id number].  Marland Kitchen CAD (coronary artery disease)     a. 05/2001 CABG x4: LIMA->LAD, VG->D1, VG->D2, VG->RCA.  Marland Kitchen  Retinopathy due to secondary diabetes     type II, uncontrolled  . CKD (chronic kidney disease), stage III   . Biventricular implantable cardiac defibrillator in situ 2007, 2012    a. 2007;  b. 2012 Gen change: SJM 3231-40 Uni BiV ICD, ser # [id number].  Marland Kitchen Hypoxemia requiring supplemental oxygen   . Candidiasis of vulva and vagina   . Hypothyroidism   . 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 Truett Perna, in remission  . History of pneumonia 12/27/11    "3 times; it's been a long time ago"  . OSA on CPAP   . Gout ~ 08/2011  . PAF (paroxysmal atrial fibrillation)     on Coumadin  . DM (diabetes mellitus), type 2 with complications     insulin dependent, retinopathy, neuropathy  . Mural thrombus of left ventricle     before 2003, while on Coumadin, No h/o CVA  . Morbid obesity with BMI of 45.0-49.9, adult     Ht. 5'4". BMI 47.2  . Vitamin D deficiency 06/09/2012    historical  . Olecranon bursitis of right elbow 12/29/2012  . History of recurrent UTIs    Past Surgical History  Procedure Laterality Date  . Cardiac catheterization  06/03/01  . Cholecystectomy  1970  . Tubal ligation  1972  . Abdominal hysterectomy  1982  . Insert / replace / remove pacemaker  2007; 2012  w/AICD  . Coronary artery bypass graft  2003    CABG X4  . Cataract extraction      left eye   Family History  Problem Relation Age of Onset  . Kidney cancer Mother     kidney and female repo - died @ 21  . Heart disease Father     died @ 54  . Asthma Mother   . Stroke Father    Social History: Married.  Retired Financial controller. reports that she has never smoked. She has never used smokeless tobacco. She reports that she does not drink alcohol or use illicit drugs.    Allergies  Allergen Reactions  . Sulfonamide Derivatives Other (See Comments)    Unknown; "childhood allergy/mother"   Medications Prior to Admission  Medication Sig Dispense Refill  . albuterol (PROVENTIL  HFA;VENTOLIN HFA) 108 (90 BASE) MCG/ACT inhaler Inhale 2 puffs into the lungs every 6 (six) hours as needed for wheezing or shortness of breath.  1 Inhaler  1  . allopurinol (ZYLOPRIM) 100 MG tablet Take 100 mg by mouth daily.      Marland Kitchen amLODipine (NORVASC) 5 MG tablet Take 1 tablet (5 mg total) by mouth daily.  30 tablet  3  . aspirin EC 81 MG tablet Take 81 mg by mouth daily.      . calcium-vitamin D (OSCAL WITH D) 500-200 MG-UNIT per tablet Take 1 tablet by mouth daily with breakfast.      . carvedilol (COREG) 25 MG tablet Take 50 mg by mouth 2 (two) times daily with a meal.      . citalopram (CELEXA) 20 MG tablet Take 1 tablet (20 mg total) by mouth daily.  30 tablet  3  . gabapentin (NEURONTIN) 300 MG capsule Take 1 capsule (300 mg total) by mouth 2 (two) times daily.  60 capsule  2  . insulin NPH (HUMULIN N,NOVOLIN N) 100 UNIT/ML injection Inject 25-44 Units into the skin 2 (two) times daily before a meal. Take 44 units in the morning and 25 units in the evening      . insulin regular (NOVOLIN R,HUMULIN R) 100 units/mL injection Inject 15-30 Units into the skin 2 (two) times daily before a meal. Take 30 units in the morning and 15 units in the evening      . KRILL OIL PO Take 1 capsule by mouth daily. Mega Red      . levothyroxine (SYNTHROID, LEVOTHROID) 25 MCG tablet Take 25 mcg by mouth daily before breakfast.      . LORazepam (ATIVAN) 0.5 MG tablet Take 0.5 mg by mouth at bedtime.      . Multiple Vitamins-Minerals (MULTIVITAMIN WITH MINERALS) tablet Take 1 tablet by mouth daily.        . rosuvastatin (CRESTOR) 10 MG tablet Take 1 tablet (10 mg total) by mouth daily.  30 tablet  6  . traMADol (ULTRAM) 50 MG tablet Take 1 tablet (50 mg total) by mouth 2 (two) times daily as needed for pain.  60 tablet  1  . Vitamin D, Ergocalciferol, (DRISDOL) 50000 UNITS CAPS capsule Take 50,000 Units by mouth every 7 (seven) days. On Tuesday      . warfarin (COUMADIN) 5 MG tablet Take 5-7.5 mg by mouth daily.  Take 1 tablet on Mon, Wed, and Fri.  Take 1.5 tablet on Sun, Tues, Thurs, and Sat.      . [DISCONTINUED] enalapril (VASOTEC) 10 MG tablet Take 1 tablet (10 mg total) by mouth daily.  30 tablet  3  . [DISCONTINUED] torsemide (DEMADEX) 20 MG tablet Take 1 tablet (20 mg total) by mouth 2 (two) times daily.  120 tablet  2    Home: Home Living Family/patient expects to be discharged to:: Private residence Living Arrangements: Spouse/significant other Available Help at Discharge: Family;Available 24 hours/day Type of Home: House Home Access: Ramped entrance Home Layout: One level Home Equipment: Walker - 4 wheels Additional Comments: Pt unable to provide details of bathroom due to ETT tube  Functional History: Prior Function Comments: but fatigued quickly per chart Functional Status:  Mobility: Bed Mobility Bed Mobility: Not assessed Transfers Transfers: Sit to Stand;Stand to Sit Sit to Stand: 3: Mod assist;With armrests;From chair/3-in-1 Sit to Stand: Patient Percentage: 60% Stand to Sit: 4: Min assist;With armrests;To chair/3-in-1 Stand to Sit: Patient Percentage: 60% Ambulation/Gait Ambulation/Gait Assistance: 4: Min assist (+1 to follow with chair) Ambulation Distance (Feet): 10 Feet Assistive device: Rolling walker Ambulation/Gait Assistance Details: mod assist x 1 during gait to recover balance with partial LOB Gait Pattern: Step-through pattern;Decreased stride length;Wide base of support;Trunk flexed Gait velocity: decreased General Gait Details: +sob, SpO2 at 84% on 4LO2 at end of amb back from bathroom. Pt returned to 100% in 2 min Stairs: No    ADL: ADL Eating/Feeding: NPO Grooming: Wash/dry hands;Moderate assistance Where Assessed - Grooming: Supported sitting Upper Body Bathing: +1 Total assistance Where Assessed - Upper Body Bathing: Supported sitting Lower Body Bathing: +1 Total assistance Where Assessed - Lower Body Bathing: Supported sit to stand Upper Body  Dressing: +1 Total assistance Where Assessed - Upper Body Dressing: Supported sitting;Unsupported sitting Lower Body Dressing: +1 Total assistance Where Assessed - Lower Body Dressing: Supported sit to Pharmacist, hospital: +2 Total assistance Toilet Transfer Method: Sit to Barista: Gaffer Method: Not assessed Transfers/Ambulation Related to ADLs: Total A +2 (and third person to manage lines) pt ~60% ADL Comments: Pt's spouse assists her with socks and shoes at home  Cognition: Cognition Overall Cognitive Status: Within Functional Limits for tasks assessed Orientation Level: Oriented X4 Cognition Arousal/Alertness: Awake/alert Behavior During Therapy: WFL for tasks assessed/performed Overall Cognitive Status: Within Functional Limits for tasks assessed Area of Impairment: Orientation;Following commands Orientation Level: Place Following Commands: Follows one step commands with increased time General Comments: Pt fatigued and difficulty maintaining full arrousal  Difficult to assess due to: Level of arousal;Intubated  Blood pressure 156/74, pulse 79, temperature 97.6 F (36.4 C), temperature source Oral, resp. rate 25, height 5\' 4"  (1.626 m), weight 122.9 kg (270 lb 15.1 oz), SpO2 95.00%. Physical Exam  Constitutional: She is oriented to person, place, and time. She appears well-developed and well-nourished.  Morbidly obese  HENT:  Head: Normocephalic and atraumatic.  Eyes: Pupils are equal, round, and reactive to light.  Neck: Normal range of motion. Neck supple. No tracheal deviation present. No thyromegaly present.  Cardiovascular: Normal rate and regular rhythm.   Respiratory: Effort normal and breath sounds normal.  Oxygen [place]  GI: Soft. She exhibits no distension.  Musculoskeletal:  Mild edema in LE's. Limbs non=tender  Neurological: She is alert and oriented to person, place, and time. She has normal reflexes. No  cranial nerve deficit. Coordination normal.  UE 3+ prox to 4/5 distally. LE 2+ prox to 4/5 at feet.  Psychiatric: She has a normal mood and affect. Her behavior is normal. Judgment and thought content normal.    Results for orders placed during the hospital encounter of 03/05/13 (from the past  24 hour(s))  GLUCOSE, CAPILLARY     Status: Abnormal   Collection Time    03/22/13  4:25 PM      Result Value Range   Glucose-Capillary 161 (*) 70 - 99 mg/dL  GLUCOSE, CAPILLARY     Status: Abnormal   Collection Time    03/22/13  9:27 PM      Result Value Range   Glucose-Capillary 195 (*) 70 - 99 mg/dL  HEPARIN LEVEL (UNFRACTIONATED)     Status: None   Collection Time    03/23/13  3:45 AM      Result Value Range   Heparin Unfractionated 0.48  0.30 - 0.70 IU/mL  PROTIME-INR     Status: Abnormal   Collection Time    03/23/13  3:45 AM      Result Value Range   Prothrombin Time 15.3 (*) 11.6 - 15.2 seconds   INR 1.24  0.00 - 1.49  CBC     Status: Abnormal   Collection Time    03/23/13  3:45 AM      Result Value Range   WBC 10.8 (*) 4.0 - 10.5 K/uL   RBC 3.58 (*) 3.87 - 5.11 MIL/uL   Hemoglobin 10.7 (*) 12.0 - 15.0 g/dL   HCT 16.1 (*) 09.6 - 04.5 %   MCV 93.6  78.0 - 100.0 fL   MCH 29.9  26.0 - 34.0 pg   MCHC 31.9  30.0 - 36.0 g/dL   RDW 40.9  81.1 - 91.4 %   Platelets 237  150 - 400 K/uL  BASIC METABOLIC PANEL     Status: Abnormal   Collection Time    03/23/13  3:45 AM      Result Value Range   Sodium 141  135 - 145 mEq/L   Potassium 3.7  3.5 - 5.1 mEq/L   Chloride 101  96 - 112 mEq/L   CO2 31  19 - 32 mEq/L   Glucose, Bld 214 (*) 70 - 99 mg/dL   BUN 51 (*) 6 - 23 mg/dL   Creatinine, Ser 7.82  0.50 - 1.10 mg/dL   Calcium 8.9  8.4 - 95.6 mg/dL   GFR calc non Af Amer 57 (*) >90 mL/min   GFR calc Af Amer 66 (*) >90 mL/min  GLUCOSE, CAPILLARY     Status: Abnormal   Collection Time    03/23/13  8:08 AM      Result Value Range   Glucose-Capillary 208 (*) 70 - 99 mg/dL   Comment  1 Notify RN    GLUCOSE, CAPILLARY     Status: Abnormal   Collection Time    03/23/13 12:02 PM      Result Value Range   Glucose-Capillary 258 (*) 70 - 99 mg/dL   No results found.  Assessment/Plan: Diagnosis: deconditioning related to CHF and multiple medical 1. Does the need for close, 24 hr/day medical supervision in concert with the patient's rehab needs make it unreasonable for this patient to be served in a less intensive setting? Yes 2. Co-Morbidities requiring supervision/potential complications: htn, dm, ckd, morbid obesity,  3. Due to bladder management, bowel management, safety, skin/wound care, disease management, medication administration, pain management and patient education, does the patient require 24 hr/day rehab nursing? Yes 4. Does the patient require coordinated care of a physician, rehab nurse, PT (1-2 hrs/day, 5 days/week) and OT (1-2 hrs/day, 5 days/week) to address physical and functional deficits in the context of the above medical diagnosis(es)? Yes Addressing deficits  in the following areas: balance, endurance, locomotion, strength, transferring, bowel/bladder control, bathing, dressing, feeding, grooming, toileting and psychosocial support 5. Can the patient actively participate in an intensive therapy program of at least 3 hrs of therapy per day at least 5 days per week? Yes 6. The potential for patient to make measurable gains while on inpatient rehab is excellent 7. Anticipated functional outcomes upon discharge from inpatient rehab are supervision to min assist with PT, supervision to min assist with OT, n/a with SLP. 8. Estimated rehab length of stay to reach the above functional goals is: 15-20 days 9. Does the patient have adequate social supports to accommodate these discharge functional goals? Yes 10. Anticipated D/C setting: Home 11. Anticipated post D/C treatments: HH therapy 12. Overall Rehab/Functional Prognosis: excellent  RECOMMENDATIONS: This  patient's condition is appropriate for continued rehabilitative care in the following setting: CIR Patient has agreed to participate in recommended program. Yes Note that insurance prior authorization may be required for reimbursement for recommended care.  Comment: Rehab RN to follow up.   Ranelle Oyster, MD, [place] [facility]     03/23/2013

## 2013-03-23 NOTE — Progress Notes (Signed)
PULMONARY  / CRITICAL CARE MEDICINE  Name: Destiny [name] MRN: [id number] DOB: 12-26-1940    ADMISSION DATE:  03/05/2013 CONSULTATION DATE:  03/13/13  REFERRING MD : EDP PRIMARY SERVICE: PCCM  CHIEF COMPLAINT:  Acute resp failure   BRIEF PATIENT DESCRIPTION: 72 yo with AF, CHF (EF 35-40%), obesity, OSA. Admitted 10/23 with decompensated heart failure. On 10/31 developed increased fever, increased work of breathing and delirium. PCCM asked to see for progressive respiratory failure.   SIGNIFICANT EVENTS / STUDIES:  10/24  EF 35-40% gd II diastolic dysfxn 10/31  Self extubation, failed 11/5    Passed SBT, failed extubation 11/6    Goals of care conference with huband and son >>> optimize medically and extubate / no reintubation 11/7    Passed SBT. Extubated and looks good.  11/08  Looks good without distress. Cognition intact. Transfer to SDU 11/10  Palliative care team >>> DNR, no ICU readmissions, possible short term rehab  LINES / TUBES: ETT     1031>>> 11/01 (self ext),       11/01 >>> 11/05 (planned)     11/05 >>> 11/07 L IJ CVL  10/31 >>> 11/07  CULTURES: Urine 10/28>> NEG Resp 10/31>> NEG Blood 10/31>> NEG Urine 10/31>> NEG C diff  pcr 11/3>> NEG  ANTIBIOTICS: Levaquin 10/30 >>> 11/2 Zosyn 10/31 >>> 10/31  Vancomycin 10/30 >>> 11/2  Cefepime 10/31 >>> 11/4   SUBJECTIVE: No overnight events.  Reports minor rectal bleeding  / discomfort after rectal tube was take out on Saturday, but this is improving.  VITAL SIGNS: Temp:  [97.4 F (36.3 C)-98.6 F (37 C)] 97.8 F (36.6 C) (11/10 0839) Pulse Rate:  [75-91] 84 (11/10 1012) Resp:  [20-29] 25 (11/10 0839) BP: (153-168)/(51-65) 160/65 mmHg (11/10 0839) SpO2:  [91 %-98 %] 95 % (11/10 1137)  HEMODYNAMICS:   VENTILATOR SETTINGS:   INTAKE / OUTPUT: Intake/Output     11/09 0701 - 11/10 0700 11/10 0701 - 11/11 0700   P.O. 960    I.V. (mL/kg) 352 (2.9)    Total Intake(mL/kg) 1312 (10.7)    Urine (mL/kg/hr) 720  (0.2)    Stool     Total Output 720     Net +592          Urine Occurrence 3 x    Stool Occurrence 3 x     PHYSICAL EXAMINATION:  Gen: Resting comfortable, no distress HEENT: PERRL PULM: Bilateral air entry, few bibasilar rales CV: regular, no murmurs AB: Soft, bowel sounds present Ext: Trace edema bilaterally Neuro: No focal deficits  LABS:   Recent Labs Lab 03/19/13 0520 03/21/13 1645 03/23/13 0345  HGB 10.3* 11.0* 10.7*  HCT 33.2* 33.2* 33.5*  WBC 12.5* 15.3* 10.8*  PLT 257 288 237    Recent Labs Lab 03/18/13 0425 03/19/13 0520 03/20/13 0345 03/21/13 0330 03/23/13 0345  NA 150* 152* 147* 144 141  K 4.2 4.1 3.4* 3.9 3.7  CL 110 109 106 103 101  CO2 33* 33* 31 30 31   GLUCOSE 340* 342* 199* 255* 214*  BUN 66* 67* 73* 71* 51*  CREATININE 1.25* 1.22* 1.39* 1.22* 0.98  CALCIUM 9.2 9.8 8.9 9.2 8.9    Recent Labs Lab 03/22/13 1226 03/22/13 1625 03/22/13 2127 03/23/13 0808 03/23/13 1202  GLUCAP 237* 161* 195* 208* 258*  \  CXR: None today  ASSESSMENT / PLAN:  PULMONARY A: Acute on chronic respiratory failure. OHS/OSA. Pulmonary edema - resolved. P:   Goal SpO2>92 Supplemental oxygen PRN Albuterol  Pulmocort  CARDIOVASCULAR A: Acute on chronic combined CHF. AF. Hypertension. P:  ASA, Norvasc, Coreg, Vasotec Hydralazine PRN  RENAL A:  CKD P:   Trend BMP Torsemide KCl 20 daily  GASTROINTESTINAL A:  Mild hemorrhoidal hemorrhage - now resolving. P:   Heart diet Prep H  HEMATOLOGIC A:  Mild anemia without acute blood loss. Therapeutic anticoagulation (for AF).  P:  Heparin to Coumadin conversion per Pharmacy, may need to d/c if hemorrhoidal bleeding continues.   INFECTIOUS A:  No acute issues. P:   No intervention required  ENDOCRINE A:  DM II. Hypothyroidism. P:   Increase Lantus to 30 Preprandial and SSI  NEUROLOGIC A:  Chronic deconditioning P:   Low dose Fentanyl / Ativan PRN Continue Neurontin Palliative care  following Acute rehab consult placed Cont PT/OT  DNR / DNI.  Awaiting rehab placement.  I have personally obtained history, examined patient, evaluated and interpreted laboratory and imaging results, reviewed medical records, formulated assessment / plan and placed orders.  Lonia Farber, MD Pulmonary and Critical Care Medicine [facility] Pager: [phone]  03/23/2013, 12:38 PM

## 2013-03-23 NOTE — Evaluation (Signed)
Physical Therapy Evaluation Patient Details Name: Destiny [name] MRN: [id number] DOB: 15-Jun-1940 Today's Date: 03/23/2013 Time: 4132-4401 PT Time Calculation (min): 18 min  PT Assessment / Plan / Recommendation History of Present Illness  72 yo female with h/o chf, s/p cabg, ckd, aicd, on home oxygen 2 L [place] cont (this has increased over the last month, was prev only prn during day) comes in with several days of worsening sob and cough. On 10/31 developed increased fever, increased work of breathing and delirium.  Pt intubated 10/31-11/6  Clinical Impression  Pt very pleasant and eager to progress to return to modI level she was at prior to admission. Spouse of 91yrs present and also eager to increase strength and function. Pt with below deficits and complicated course due to heart failure and VDRF who will benefit from acute therapy to maximize mobility, strength and function to decrease burden of care.     PT Assessment  Patient needs continued PT services    Follow Up Recommendations  CIR    Does the patient have the potential to tolerate intense rehabilitation      Barriers to Discharge        Equipment Recommendations       Recommendations for Other Services Rehab consult   Frequency Min 3X/week    Precautions / Restrictions Precautions Precautions: Fall Precaution Comments: watch sats   Pertinent Vitals/Pain Sats 92% on 4L with gait, 96% on 3L at rest HR 84 No pain      Mobility  Bed Mobility Bed Mobility: Not assessed Details for Bed Mobility Assistance: pt in chair on arrival Transfers Sit to Stand: 3: Mod assist;With armrests;From chair/3-in-1 Stand to Sit: 4: Min assist;With armrests;To chair/3-in-1 Details for Transfer Assistance: cueing for scooting to edge and back in chair with use of pad, reciprocal scooting and assist. Assist for anterior translation and controlled descent with sit<>stand Ambulation/Gait Ambulation/Gait Assistance: 4: Min assist (+1 to  follow with chair) Ambulation Distance (Feet): 10 Feet Assistive device: Rolling walker Ambulation/Gait Assistance Details: mod assist x 1 during gait to recover balance with partial LOB Gait Pattern: Step-through pattern;Decreased stride length;Wide base of support;Trunk flexed Gait velocity: decreased Stairs: No    Exercises     PT Diagnosis: Difficulty walking;Generalized weakness  PT Problem List: Decreased strength;Decreased activity tolerance;Decreased balance;Decreased mobility;Decreased cognition;Decreased knowledge of use of DME;Cardiopulmonary status limiting activity;Obesity PT Treatment Interventions: DME instruction;Gait training;Functional mobility training;Therapeutic activities;Therapeutic exercise;Balance training;Neuromuscular re-education;Patient/family education     PT Goals(Current goals can be found in the care plan section) Acute Rehab PT Goals Patient Stated Goal: return home with spouse Time For Goal Achievement: 04/06/13 Potential to Achieve Goals: Good  Visit Information  Last PT Received On: 03/23/13 Assistance Needed: +2 (chair for safety with ambulation) History of Present Illness: 72 yo female with h/o chf, s/p cabg, ckd, aicd, on home oxygen 2 L [place] cont (this has increased over the last month, was prev only prn during day) comes in with several days of worsening sob and cough. On 10/31 developed increased fever, increased work of breathing and delirium.  Pt intubated 10/31-11/6       Prior Functioning       Cognition  Cognition Arousal/Alertness: Awake/alert Behavior During Therapy: WFL for tasks assessed/performed Overall Cognitive Status: Within Functional Limits for tasks assessed    Extremity/Trunk Assessment Upper Extremity Assessment Upper Extremity Assessment: Defer to OT evaluation Lower Extremity Assessment Lower Extremity Assessment: Generalized weakness (3/5 bil LE hip and knee strength) Cervical / Trunk  Assessment Cervical / Trunk  Assessment: Normal   Balance    End of Session PT - End of Session Equipment Utilized During Treatment: Gait belt;Oxygen Activity Tolerance: Patient limited by fatigue Patient left: in chair;with call bell/phone within reach;with family/visitor present Nurse Communication: Mobility status  GP     Toney Sang [facility] 03/23/2013, 10:19 AM  Delaney Meigs, PT [phone]

## 2013-03-23 NOTE — Progress Notes (Signed)
Palliative Medicine Team Consult Note  Met with patient and her husband to discuss goals of care and potential hospice options. Patient is much improved, she is OOB, has full mental capacity and reports feeling better.  Summary of Goals:  1. DNR 2. No Trach, No repeat ICU admission-if decompensates treat reversible but no invasive interventions and focus on comfort. 3. Maximize medical interventions that are reasonable to achieve QOL. 4. Based on how she looks this AM-She will need short term rehab, CIR attempt prior to going home- at the time she transitions home she may be eligible for Hospice care and she is agreeable to that 5. Will work on her chronic pain issues.   Anderson Malta, DO Palliative Medicine

## 2013-03-24 ENCOUNTER — Encounter (HOSPITAL_COMMUNITY): Payer: Self-pay | Admitting: *Deleted

## 2013-03-24 ENCOUNTER — Encounter (HOSPITAL_COMMUNITY): Payer: Self-pay | Admitting: Physician Assistant

## 2013-03-24 ENCOUNTER — Inpatient Hospital Stay (HOSPITAL_COMMUNITY)
Admission: RE | Admit: 2013-03-24 | Discharge: 2013-04-01 | DRG: 945 | Disposition: A | Discharge status: Home Health | Payer: Medicare Other | Source: Intra-hospital | Attending: Physical Medicine & Rehabilitation | Admitting: Physical Medicine & Rehabilitation

## 2013-03-24 DIAGNOSIS — E11319 Type 2 diabetes mellitus with unspecified diabetic retinopathy without macular edema: Secondary | ICD-10-CM | POA: Diagnosis present

## 2013-03-24 DIAGNOSIS — E669 Obesity, unspecified: Secondary | ICD-10-CM

## 2013-03-24 DIAGNOSIS — Z9981 Dependence on supplemental oxygen: Secondary | ICD-10-CM

## 2013-03-24 DIAGNOSIS — G4733 Obstructive sleep apnea (adult) (pediatric): Secondary | ICD-10-CM | POA: Diagnosis not present

## 2013-03-24 DIAGNOSIS — Z7982 Long term (current) use of aspirin: Secondary | ICD-10-CM

## 2013-03-24 DIAGNOSIS — M545 Low back pain, unspecified: Secondary | ICD-10-CM | POA: Diagnosis present

## 2013-03-24 DIAGNOSIS — E559 Vitamin D deficiency, unspecified: Secondary | ICD-10-CM | POA: Diagnosis present

## 2013-03-24 DIAGNOSIS — E1149 Type 2 diabetes mellitus with other diabetic neurological complication: Secondary | ICD-10-CM | POA: Diagnosis present

## 2013-03-24 DIAGNOSIS — I255 Ischemic cardiomyopathy: Secondary | ICD-10-CM

## 2013-03-24 DIAGNOSIS — I480 Paroxysmal atrial fibrillation: Secondary | ICD-10-CM | POA: Diagnosis present

## 2013-03-24 DIAGNOSIS — Z794 Long term (current) use of insulin: Secondary | ICD-10-CM

## 2013-03-24 DIAGNOSIS — Z95810 Presence of automatic (implantable) cardiac defibrillator: Secondary | ICD-10-CM

## 2013-03-24 DIAGNOSIS — F411 Generalized anxiety disorder: Secondary | ICD-10-CM | POA: Diagnosis present

## 2013-03-24 DIAGNOSIS — J9601 Acute respiratory failure with hypoxia: Secondary | ICD-10-CM

## 2013-03-24 DIAGNOSIS — I5043 Acute on chronic combined systolic (congestive) and diastolic (congestive) heart failure: Secondary | ICD-10-CM | POA: Diagnosis not present

## 2013-03-24 DIAGNOSIS — Z951 Presence of aortocoronary bypass graft: Secondary | ICD-10-CM

## 2013-03-24 DIAGNOSIS — N179 Acute kidney failure, unspecified: Secondary | ICD-10-CM | POA: Diagnosis present

## 2013-03-24 DIAGNOSIS — N183 Chronic kidney disease, stage 3 unspecified: Secondary | ICD-10-CM | POA: Diagnosis present

## 2013-03-24 DIAGNOSIS — E1142 Type 2 diabetes mellitus with diabetic polyneuropathy: Secondary | ICD-10-CM | POA: Diagnosis present

## 2013-03-24 DIAGNOSIS — I509 Heart failure, unspecified: Secondary | ICD-10-CM

## 2013-03-24 DIAGNOSIS — J9600 Acute respiratory failure, unspecified whether with hypoxia or hypercapnia: Secondary | ICD-10-CM | POA: Diagnosis not present

## 2013-03-24 DIAGNOSIS — E1139 Type 2 diabetes mellitus with other diabetic ophthalmic complication: Secondary | ICD-10-CM | POA: Diagnosis present

## 2013-03-24 DIAGNOSIS — I4891 Unspecified atrial fibrillation: Secondary | ICD-10-CM | POA: Diagnosis not present

## 2013-03-24 DIAGNOSIS — J189 Pneumonia, unspecified organism: Secondary | ICD-10-CM | POA: Diagnosis present

## 2013-03-24 DIAGNOSIS — Z87440 Personal history of urinary (tract) infections: Secondary | ICD-10-CM

## 2013-03-24 DIAGNOSIS — Z7901 Long term (current) use of anticoagulants: Secondary | ICD-10-CM

## 2013-03-24 DIAGNOSIS — Z6841 Body Mass Index (BMI) 40.0 and over, adult: Secondary | ICD-10-CM | POA: Diagnosis not present

## 2013-03-24 DIAGNOSIS — R5381 Other malaise: Secondary | ICD-10-CM | POA: Diagnosis present

## 2013-03-24 DIAGNOSIS — M109 Gout, unspecified: Secondary | ICD-10-CM | POA: Diagnosis present

## 2013-03-24 DIAGNOSIS — I2589 Other forms of chronic ischemic heart disease: Secondary | ICD-10-CM | POA: Diagnosis not present

## 2013-03-24 DIAGNOSIS — K625 Hemorrhage of anus and rectum: Secondary | ICD-10-CM | POA: Diagnosis present

## 2013-03-24 DIAGNOSIS — J9620 Acute and chronic respiratory failure, unspecified whether with hypoxia or hypercapnia: Secondary | ICD-10-CM | POA: Diagnosis present

## 2013-03-24 DIAGNOSIS — I2510 Atherosclerotic heart disease of native coronary artery without angina pectoris: Secondary | ICD-10-CM | POA: Diagnosis present

## 2013-03-24 DIAGNOSIS — J9819 Other pulmonary collapse: Secondary | ICD-10-CM | POA: Diagnosis not present

## 2013-03-24 DIAGNOSIS — I5042 Chronic combined systolic (congestive) and diastolic (congestive) heart failure: Secondary | ICD-10-CM

## 2013-03-24 DIAGNOSIS — R040 Epistaxis: Secondary | ICD-10-CM | POA: Diagnosis present

## 2013-03-24 DIAGNOSIS — J811 Chronic pulmonary edema: Secondary | ICD-10-CM | POA: Diagnosis present

## 2013-03-24 DIAGNOSIS — I129 Hypertensive chronic kidney disease with stage 1 through stage 4 chronic kidney disease, or unspecified chronic kidney disease: Secondary | ICD-10-CM | POA: Diagnosis present

## 2013-03-24 DIAGNOSIS — G8929 Other chronic pain: Secondary | ICD-10-CM | POA: Diagnosis present

## 2013-03-24 DIAGNOSIS — E1165 Type 2 diabetes mellitus with hyperglycemia: Secondary | ICD-10-CM

## 2013-03-24 DIAGNOSIS — Z5189 Encounter for other specified aftercare: Principal | ICD-10-CM

## 2013-03-24 DIAGNOSIS — E039 Hypothyroidism, unspecified: Secondary | ICD-10-CM | POA: Diagnosis present

## 2013-03-24 DIAGNOSIS — IMO0001 Reserved for inherently not codable concepts without codable children: Secondary | ICD-10-CM

## 2013-03-24 DIAGNOSIS — D62 Acute posthemorrhagic anemia: Secondary | ICD-10-CM

## 2013-03-24 DIAGNOSIS — E662 Morbid (severe) obesity with alveolar hypoventilation: Secondary | ICD-10-CM | POA: Diagnosis present

## 2013-03-24 DIAGNOSIS — I10 Essential (primary) hypertension: Secondary | ICD-10-CM

## 2013-03-24 DIAGNOSIS — M549 Dorsalgia, unspecified: Secondary | ICD-10-CM | POA: Diagnosis present

## 2013-03-24 LAB — URINALYSIS, ROUTINE W REFLEX MICROSCOPIC
Bilirubin Urine: NEGATIVE
Ketones, ur: NEGATIVE mg/dL
Nitrite: NEGATIVE
Protein, ur: 30 mg/dL — AB
Specific Gravity, Urine: 1.011 (ref 1.005–1.030)
Urobilinogen, UA: 0.2 mg/dL (ref 0.0–1.0)

## 2013-03-24 LAB — CBC
Hemoglobin: 11.3 g/dL — ABNORMAL LOW (ref 12.0–15.0)
RBC: 3.78 MIL/uL — ABNORMAL LOW (ref 3.87–5.11)
WBC: 11.8 10*3/uL — ABNORMAL HIGH (ref 4.0–10.5)

## 2013-03-24 LAB — URINE MICROSCOPIC-ADD ON

## 2013-03-24 LAB — BASIC METABOLIC PANEL
CO2: 30 mEq/L (ref 19–32)
Chloride: 103 mEq/L (ref 96–112)
Glucose, Bld: 158 mg/dL — ABNORMAL HIGH (ref 70–99)
Potassium: 4.2 mEq/L (ref 3.5–5.1)
Sodium: 142 mEq/L (ref 135–145)

## 2013-03-24 LAB — GLUCOSE, CAPILLARY
Glucose-Capillary: 150 mg/dL — ABNORMAL HIGH (ref 70–99)
Glucose-Capillary: 227 mg/dL — ABNORMAL HIGH (ref 70–99)
Glucose-Capillary: 183 mg/dL — ABNORMAL HIGH (ref 70–99)
Glucose-Capillary: 190 mg/dL — ABNORMAL HIGH (ref 70–99)

## 2013-03-24 LAB — PROTIME-INR
INR: 1.22 (ref 0.00–1.49)
Prothrombin Time: 15.1 seconds (ref 11.6–15.2)

## 2013-03-24 LAB — HEPARIN LEVEL (UNFRACTIONATED): Heparin Unfractionated: 0.58 IU/mL (ref 0.30–0.70)

## 2013-03-24 MED ORDER — POLYETHYLENE GLYCOL 3350 17 G PO PACK
17.0000 g | PACK | Freq: Every day | ORAL | Status: DC | PRN
  Filled 2013-03-24: qty 1

## 2013-03-24 MED ORDER — GABAPENTIN 100 MG PO CAPS
200.0000 mg | ORAL_CAPSULE | Freq: Two times a day (BID) | ORAL | Status: DC
  Administered 2013-03-24 – 2013-04-01 (×16): 200 mg via ORAL
  Filled 2013-03-24 (×19): qty 2

## 2013-03-24 MED ORDER — HYDROCORTISONE 2.5 % RE CREA
TOPICAL_CREAM | Freq: Three times a day (TID) | RECTAL | Status: DC
  Administered 2013-03-24 – 2013-03-28 (×8): via RECTAL
  Filled 2013-03-24 (×2): qty 28.35

## 2013-03-24 MED ORDER — GLUCERNA SHAKE PO LIQD
237.0000 mL | Freq: Every day | ORAL | Status: DC
  Administered 2013-03-25 – 2013-03-31 (×6): 237 mL via ORAL

## 2013-03-24 MED ORDER — FLUTICASONE PROPIONATE 50 MCG/ACT NA SUSP
2.0000 | Freq: Every day | NASAL | Status: DC
  Administered 2013-03-24: 2 via NASAL
  Filled 2013-03-24: qty 16

## 2013-03-24 MED ORDER — WARFARIN SODIUM 10 MG PO TABS
10.0000 mg | ORAL_TABLET | Freq: Once | ORAL | Status: DC
  Filled 2013-03-24: qty 1

## 2013-03-24 MED ORDER — LORAZEPAM 0.5 MG PO TABS
0.5000 mg | ORAL_TABLET | Freq: Every evening | ORAL | Status: DC | PRN
  Administered 2013-03-24 – 2013-03-25 (×2): 0.5 mg via ORAL
  Filled 2013-03-24 (×3): qty 1

## 2013-03-24 MED ORDER — CARVEDILOL 6.25 MG PO TABS
6.2500 mg | ORAL_TABLET | Freq: Two times a day (BID) | ORAL | Status: DC
  Administered 2013-03-25 – 2013-04-01 (×15): 6.25 mg via ORAL
  Filled 2013-03-24 (×18): qty 1

## 2013-03-24 MED ORDER — INSULIN ASPART 100 UNIT/ML ~~LOC~~ SOLN
0.0000 [IU] | Freq: Three times a day (TID) | SUBCUTANEOUS | Status: DC
  Administered 2013-03-25: 3 [IU] via SUBCUTANEOUS
  Administered 2013-03-25: 2 [IU] via SUBCUTANEOUS
  Administered 2013-03-25: 8 [IU] via SUBCUTANEOUS
  Administered 2013-03-26: 5 [IU] via SUBCUTANEOUS
  Administered 2013-03-26 – 2013-03-27 (×4): 3 [IU] via SUBCUTANEOUS
  Administered 2013-03-27: 2 [IU] via SUBCUTANEOUS
  Administered 2013-03-28 (×2): 3 [IU] via SUBCUTANEOUS
  Administered 2013-03-28: 8 [IU] via SUBCUTANEOUS
  Administered 2013-03-29: 11 [IU] via SUBCUTANEOUS
  Administered 2013-03-29 (×2): 8 [IU] via SUBCUTANEOUS
  Administered 2013-03-30: 3 [IU] via SUBCUTANEOUS
  Administered 2013-03-30 (×2): 8 [IU] via SUBCUTANEOUS
  Administered 2013-03-31: 5 [IU] via SUBCUTANEOUS
  Administered 2013-03-31: 3 [IU] via SUBCUTANEOUS
  Administered 2013-03-31: 5 [IU] via SUBCUTANEOUS
  Administered 2013-04-01: 3 [IU] via SUBCUTANEOUS

## 2013-03-24 MED ORDER — FLUTICASONE PROPIONATE 50 MCG/ACT NA SUSP
2.0000 | Freq: Every day | NASAL | Status: DC
  Administered 2013-03-25 – 2013-03-31 (×7): 2 via NASAL
  Filled 2013-03-24: qty 16

## 2013-03-24 MED ORDER — ONDANSETRON HCL 4 MG PO TABS: 4.0000 mg | ORAL_TABLET | Freq: Four times a day (QID) | ORAL | Status: DC | PRN

## 2013-03-24 MED ORDER — INSULIN ASPART 100 UNIT/ML ~~LOC~~ SOLN
3.0000 [IU] | Freq: Three times a day (TID) | SUBCUTANEOUS | Status: DC
  Administered 2013-03-25 – 2013-03-26 (×4): 3 [IU] via SUBCUTANEOUS

## 2013-03-24 MED ORDER — TORSEMIDE 20 MG PO TABS
20.0000 mg | ORAL_TABLET | Freq: Two times a day (BID) | ORAL | Status: DC
  Administered 2013-03-25 – 2013-04-01 (×15): 20 mg via ORAL
  Filled 2013-03-24 (×16): qty 1

## 2013-03-24 MED ORDER — FLEET ENEMA 7-19 GM/118ML RE ENEM: 1.0000 | ENEMA | Freq: Once | RECTAL | Status: AC | PRN

## 2013-03-24 MED ORDER — BISACODYL 10 MG RE SUPP: 10.0000 mg | Freq: Every day | RECTAL | Status: DC | PRN

## 2013-03-24 MED ORDER — DIPHENHYDRAMINE HCL 12.5 MG/5ML PO ELIX: 12.5000 mg | ORAL_SOLUTION | Freq: Four times a day (QID) | ORAL | Status: DC | PRN

## 2013-03-24 MED ORDER — PHENYLEPHRINE HCL 0.5 % NA SOLN
1.0000 [drp] | Freq: Four times a day (QID) | NASAL | Status: DC | PRN
  Administered 2013-03-26 – 2013-03-28 (×2): 1 [drp] via NASAL
  Filled 2013-03-24: qty 15

## 2013-03-24 MED ORDER — ENALAPRIL MALEATE 10 MG PO TABS
10.0000 mg | ORAL_TABLET | Freq: Every day | ORAL | Status: DC
  Administered 2013-03-24 – 2013-03-29 (×6): 10 mg via ORAL
  Filled 2013-03-24 (×7): qty 1

## 2013-03-24 MED ORDER — POTASSIUM CHLORIDE CRYS ER 20 MEQ PO TBCR
20.0000 meq | EXTENDED_RELEASE_TABLET | Freq: Every day | ORAL | Status: DC
  Administered 2013-03-25 – 2013-03-27 (×3): 20 meq via ORAL
  Filled 2013-03-24 (×4): qty 1

## 2013-03-24 MED ORDER — ENOXAPARIN SODIUM 30 MG/0.3ML ~~LOC~~ SOLN
30.0000 mg | SUBCUTANEOUS | Status: DC
  Administered 2013-03-24 – 2013-03-25 (×2): 30 mg via SUBCUTANEOUS
  Filled 2013-03-24 (×3): qty 0.3

## 2013-03-24 MED ORDER — AMLODIPINE BESYLATE 5 MG PO TABS
5.0000 mg | ORAL_TABLET | Freq: Every day | ORAL | Status: DC
  Administered 2013-03-25 – 2013-04-01 (×8): 5 mg via ORAL
  Filled 2013-03-24 (×9): qty 1

## 2013-03-24 MED ORDER — INSULIN ASPART 100 UNIT/ML ~~LOC~~ SOLN
0.0000 [IU] | Freq: Every day | SUBCUTANEOUS | Status: DC
  Administered 2013-03-24: 2 [IU] via SUBCUTANEOUS
  Administered 2013-03-25: 3 [IU] via SUBCUTANEOUS
  Administered 2013-03-26 – 2013-03-27 (×2): 2 [IU] via SUBCUTANEOUS
  Administered 2013-03-28: 3 [IU] via SUBCUTANEOUS
  Administered 2013-03-29: 22:00:00 via SUBCUTANEOUS
  Administered 2013-03-30: 2 [IU] via SUBCUTANEOUS

## 2013-03-24 MED ORDER — ACETAMINOPHEN 325 MG PO TABS
325.0000 mg | ORAL_TABLET | ORAL | Status: DC | PRN
  Administered 2013-03-25 (×3): 325 mg via ORAL
  Administered 2013-03-26 – 2013-03-31 (×9): 650 mg via ORAL
  Filled 2013-03-24 (×5): qty 2
  Filled 2013-03-24: qty 1
  Filled 2013-03-24 (×2): qty 2
  Filled 2013-03-24: qty 1
  Filled 2013-03-24: qty 2
  Filled 2013-03-24: qty 1
  Filled 2013-03-24: qty 2

## 2013-03-24 MED ORDER — INSULIN GLARGINE 100 UNIT/ML ~~LOC~~ SOLN
30.0000 [IU] | Freq: Every day | SUBCUTANEOUS | Status: DC
  Administered 2013-03-25 – 2013-03-27 (×3): 30 [IU] via SUBCUTANEOUS
  Filled 2013-03-24 (×3): qty 0.3

## 2013-03-24 MED ORDER — SALINE SPRAY 0.65 % NA SOLN
2.0000 | NASAL | Status: DC | PRN
  Filled 2013-03-24: qty 44

## 2013-03-24 MED ORDER — TRAZODONE HCL 50 MG PO TABS
25.0000 mg | ORAL_TABLET | Freq: Every evening | ORAL | Status: DC | PRN
  Administered 2013-03-28 – 2013-03-31 (×5): 50 mg via ORAL
  Filled 2013-03-24 (×5): qty 1

## 2013-03-24 MED ORDER — LEVOTHYROXINE SODIUM 25 MCG PO TABS
25.0000 ug | ORAL_TABLET | Freq: Every day | ORAL | Status: DC
  Administered 2013-03-25 – 2013-04-01 (×8): 25 ug via ORAL
  Filled 2013-03-24 (×9): qty 1

## 2013-03-24 MED ORDER — BUDESONIDE 0.25 MG/2ML IN SUSP
0.2500 mg | Freq: Two times a day (BID) | RESPIRATORY_TRACT | Status: DC
  Administered 2013-03-24 – 2013-04-01 (×16): 0.25 mg via RESPIRATORY_TRACT
  Filled 2013-03-24 (×18): qty 2

## 2013-03-24 MED ORDER — ONDANSETRON HCL 4 MG/2ML IJ SOLN: 4.0000 mg | Freq: Four times a day (QID) | INTRAMUSCULAR | Status: DC | PRN

## 2013-03-24 MED ORDER — SALINE SPRAY 0.65 % NA SOLN
2.0000 | NASAL | Status: DC | PRN
  Administered 2013-03-27: 2 via NASAL
  Filled 2013-03-24 (×2): qty 44

## 2013-03-24 MED ORDER — PHENYLEPHRINE HCL 0.5 % NA SOLN: 1.0000 [drp] | Freq: Four times a day (QID) | NASAL | Status: DC | PRN

## 2013-03-24 MED ORDER — ALBUTEROL SULFATE (5 MG/ML) 0.5% IN NEBU
2.5000 mg | INHALATION_SOLUTION | Freq: Four times a day (QID) | RESPIRATORY_TRACT | Status: DC
  Administered 2013-03-24 – 2013-03-28 (×13): 2.5 mg via RESPIRATORY_TRACT
  Filled 2013-03-24 (×14): qty 0.5

## 2013-03-24 NOTE — Interval H&P Note (Signed)
Destiny [name] was admitted today to Inpatient Rehabilitation with the diagnosis of deconditioning due to respiratory failure and multiple medical issues.  The patient's history has been reviewed, patient examined, and there is no change in status.  Patient continues to be appropriate for intensive inpatient rehabilitation.  I have reviewed the patient's chart and labs.  Questions were answered to the patient's satisfaction.  Josef Tourigny T 03/24/2013, 8:28 PM

## 2013-03-24 NOTE — H&P (Signed)
Physical Medicine and Rehabilitation Admission H&P    Chief Complaint  Patient presents with  . Deconditioning.   HPI:  Destiny [name] is a 72 y.o. 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 Patient with increased O2 needs in the past month per reports.  She was treated for acute on chronic CHF with IV diuretics and improved. Was noted to have cellulitis LLE and started on levaquin. Diuretics discontinued due to acute on chronic renal failure. On 03/13/13, patient developed recurrent hypoxia with fever and lethargy requiring intubation for acute respiratory failure. She was started on IV antibiotics for PNA/HCAP and treated with IV diuretics for pulmonary edema. Was started on IV heparin. a She self extubated but required reintubation and was slow to wean off the vent. She  tolerated extubation on 03/20/13. Palliative care consulted for GOC and patient has elected DNR, no trach and no repeat ICU admission. Has been in process of  being transitioned to coumadin for A fib but has developed rectal bleeding due to hems as well as epistasis therefore all blood thinners stopped X 5 days per cards then decision to be made about coumadin v/s NOAC.  Therapies re-initiated and CIR recommended by rehab team.   Review of Systems  Constitutional: Negative for fever and chills.  HENT: Negative for hearing loss.   Eyes: Negative for blurred vision and double vision.  Respiratory: Positive for shortness of breath.   Cardiovascular: Positive for orthopnea.  Neurological: Negative for dizziness, tingling and headaches.   Past Medical History  Diagnosis Date  . LBBB (left bundle branch block)   . Carotid bruit     a. 10/19/2011 carotid duplex - Mild hard plaque bilaterally. Stable 40-59% bilateral ICA stenosis.   . Dyslipidemia   . Hypertension   .  Chronic combined systolic and diastolic CHF (congestive heart failure)     a. EF as low as 25% in 2006;  b. EF 60-65% in 12/2011;  c. 02/2013 Echo: EF 35-40, mod mid-dist antsept HK, Gr 2 DD, mild LVH.  . Back pain, chronic     "just when I walk; mass on 3rd and 4th vertebrae right lower back"  . Cardiomyopathy, ischemic     a. 2012 s/p SJM 3231-40 Uni BiV ICD, ser # [id number].  . CAD (coronary artery disease)     a. 05/2001 CABG x4: LIMA->LAD, VG->D1, VG->D2, VG->RCA.  . 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 CKD (chronic kidney disease), stage III   . Biventricular implantable cardiac defibrillator in situ 2007, 2012    a. 2007;  b. 2012 Gen change: SJM 3231-40 Uni BiV ICD, ser # [id number].  . Hypoxemia requiring supplemental oxygen   . Candidiasis of vulva and vagina   . Hypothyroidism   . 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 Sherrill, in remission  . History of pneumonia 12/27/11    "3 times; it's been a long time ago"  . OSA on CPAP   . Gout ~ 08/2011  . PAF (paroxysmal atrial fibrillation)     on Coumadin  . DM (diabetes mellitus), type 2 with complications     insulin dependent, retinopathy, neuropathy  . Mural thrombus of left ventricle     before 2003, while on Coumadin, No h/o   CVA  . Morbid obesity with BMI of 45.0-49.9, adult     Ht. 5'4". BMI 47.2  . Vitamin D deficiency 06/09/2012    historical  . Olecranon bursitis of right elbow 12/29/2012  . History of recurrent UTIs    Past Surgical History  Procedure Laterality Date  . Cardiac catheterization  06/03/01  . Cholecystectomy  1970  . Tubal ligation  1972  . Abdominal hysterectomy  1982  . Insert / replace / remove pacemaker  2007; 2012    w/AICD  . Coronary artery bypass graft  2003    CABG X4  . Cataract extraction      left eye   Family History  Problem Relation Age of Onset  . Kidney cancer Mother     kidney and female repo - died @ 57  . Heart  disease Father     died @ 61  . Asthma Mother   . Stroke Father     Social History: Married. 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 reports that she has never smoked. She has never used smokeless tobacco. She reports that she does not drink alcohol or use illicit drugs   Allergies  Allergen Reactions  . Sulfonamide Derivatives Other (See Comments)    Unknown; "childhood allergy/mother"   Medications Prior to Admission  Medication Sig Dispense Refill  . albuterol (PROVENTIL HFA;VENTOLIN HFA) 108 (90 BASE) MCG/ACT inhaler Inhale 2 puffs into the lungs every 6 (six) hours as needed for wheezing or shortness of breath.  1 Inhaler  1  . allopurinol (ZYLOPRIM) 100 MG tablet Take 100 mg by mouth daily.      . amLODipine (NORVASC) 5 MG tablet Take 1 tablet (5 mg total) by mouth daily.  30 tablet  3  . aspirin EC 81 MG tablet Take 81 mg by mouth daily.      . calcium-vitamin D (OSCAL WITH D) 500-200 MG-UNIT per tablet Take 1 tablet by mouth daily with breakfast.      . carvedilol (COREG) 25 MG tablet Take 50 mg by mouth 2 (two) times daily with a meal.      . citalopram (CELEXA) 20 MG tablet Take 1 tablet (20 mg total) by mouth daily.  30 tablet  3  . gabapentin (NEURONTIN) 300 MG capsule Take 1 capsule (300 mg total) by mouth 2 (two) times daily.  60 capsule  2  . insulin NPH (HUMULIN N,NOVOLIN N) 100 UNIT/ML injection Inject 25-44 Units into the skin 2 (two) times daily before a meal. Take 44 units in the morning and 25 units in the evening      . insulin regular (NOVOLIN R,HUMULIN R) 100 units/mL injection Inject 15-30 Units into the skin 2 (two) times daily before a meal. Take 30 units in the morning and 15 units in the evening      . KRILL OIL PO Take 1 capsule by mouth daily. Mega Red      . levothyroxine (SYNTHROID, LEVOTHROID) 25 MCG tablet Take 25 mcg by mouth daily before breakfast.      . LORazepam (ATIVAN) 0.5 MG tablet Take 0.5 mg by mouth at bedtime.      . Multiple Vitamins-Minerals (MULTIVITAMIN  WITH MINERALS) tablet Take 1 tablet by mouth daily.        . rosuvastatin (CRESTOR) 10 MG tablet Take 1 tablet (10 mg total) by mouth daily.  30 tablet  6  . traMADol (ULTRAM) 50 MG tablet Take 1 tablet (50 mg total) by mouth 2 (two) times   daily as needed for pain.  60 tablet  1  . Vitamin D, Ergocalciferol, (DRISDOL) 50000 UNITS CAPS capsule Take 50,000 Units by mouth every 7 (seven) days. On Tuesday      . warfarin (COUMADIN) 5 MG tablet Take 5-7.5 mg by mouth daily. Take 1 tablet on Mon, Wed, and Fri.  Take 1.5 tablet on Sun, Tues, Thurs, and Sat.      . [DISCONTINUED] enalapril (VASOTEC) 10 MG tablet Take 1 tablet (10 mg total) by mouth daily.  30 tablet  3  . [DISCONTINUED] torsemide (DEMADEX) 20 MG tablet Take 1 tablet (20 mg total) by mouth 2 (two) times daily.  120 tablet  2    Home: Home Living Family/patient expects to be discharged to:: Private residence Living Arrangements: Spouse/significant other Available Help at Discharge: Family;Available 24 hours/day Type of Home: House Home Access: Ramped entrance Home Layout: One level Home Equipment: Walker - 4 wheels Additional Comments: Pt unable to provide details of bathroom due to ETT tube   Functional History: Prior Function Comments: but fatigued quickly per chart  Functional Status:  Mobility: Bed Mobility Bed Mobility: Not assessed Transfers Transfers: Sit to Stand;Stand to Sit Sit to Stand: 3: Mod assist;With armrests;From chair/3-in-1 Sit to Stand: Patient Percentage: 60% Stand to Sit: 4: Min assist;With armrests;To chair/3-in-1 Stand to Sit: Patient Percentage: 60% Ambulation/Gait Ambulation/Gait Assistance: 4: Min assist (+1 to follow with chair) Ambulation Distance (Feet): 10 Feet Assistive device: Rolling walker Ambulation/Gait Assistance Details: mod assist x 1 during gait to recover balance with partial LOB Gait Pattern: Step-through pattern;Decreased stride length;Wide base of support;Trunk flexed Gait  velocity: decreased General Gait Details: +sob, SpO2 at 84% on 4LO2 at end of amb back from bathroom. Pt returned to 100% in 2 min Stairs: No    ADL: ADL Eating/Feeding: NPO Grooming: Wash/dry hands;Moderate assistance Where Assessed - Grooming: Supported sitting Upper Body Bathing: +1 Total assistance Where Assessed - Upper Body Bathing: Supported sitting Lower Body Bathing: +1 Total assistance Where Assessed - Lower Body Bathing: Supported sit to stand Upper Body Dressing: +1 Total assistance Where Assessed - Upper Body Dressing: Supported sitting;Unsupported sitting Lower Body Dressing: +1 Total assistance Where Assessed - Lower Body Dressing: Supported sit to stand Toilet Transfer: +2 Total assistance Toilet Transfer Method: Sit to stand Toilet Transfer Equipment: Bedside commode Tub/Shower Transfer Method: Not assessed Transfers/Ambulation Related to ADLs: Total A +2 (and third person to manage lines) pt ~60% ADL Comments: Pt's spouse assists her with socks and shoes at home  Cognition: Cognition Overall Cognitive Status: Within Functional Limits for tasks assessed Orientation Level: Oriented X4 Cognition Arousal/Alertness: Awake/alert Behavior During Therapy: WFL for tasks assessed/performed Overall Cognitive Status: Within Functional Limits for tasks assessed Area of Impairment: Orientation;Following commands Orientation Level: Place Following Commands: Follows one step commands with increased time General Comments: Pt fatigued and difficulty maintaining full arrousal  Difficult to assess due to: Level of arousal;Intubated  Physical Exam: Blood pressure 164/52, pulse 75, temperature 98.1 F (36.7 C), temperature source Oral, resp. rate 21, height 5' 4" (1.626 m), weight 122.9 kg (270 lb 15.1 oz), SpO2 97.00%. Physical Exam  Nursing note and vitals reviewed. Constitutional: She is oriented to person, place, and time. She appears well-developed and well-nourished.   obese  HENT:  Head: Normocephalic and atraumatic.  Right Ear: External ear normal.  Left Ear: External ear normal.  Eyes: Conjunctivae and EOM are normal. Pupils are equal, round, and reactive to light.  Neck: No JVD present. No tracheal   deviation present. No thyromegaly present.  Cardiovascular: Normal rate and intact distal pulses.   Respiratory: Effort normal and breath sounds normal. No respiratory distress. She has no wheezes.  GI: She exhibits no distension. There is no tenderness.  Musculoskeletal: She exhibits edema (trace to 1+ edema in legs).  Lymphadenopathy:    She has no cervical adenopathy.  Neurological: She is alert and oriented to person, place, and time. She has normal reflexes. No cranial nerve deficit. Coordination normal.  Pt appropriate. Good insight and awareness. Sensation intact in all 4s except for mild decrease in distal LE's. UE strength 4-4+/5. LE 3/5 HF to 4/5 at ankles. CN non-focal.  Psychiatric: She has a normal mood and affect. Her behavior is normal. Judgment and thought content normal.    Results for orders placed during the hospital encounter of 03/05/13 (from the past 48 hour(s))  GLUCOSE, CAPILLARY     Status: Abnormal   Collection Time    03/22/13 12:26 PM      Result Value Range   Glucose-Capillary 237 (*) 70 - 99 mg/dL  GLUCOSE, CAPILLARY     Status: Abnormal   Collection Time    03/22/13  4:25 PM      Result Value Range   Glucose-Capillary 161 (*) 70 - 99 mg/dL  GLUCOSE, CAPILLARY     Status: Abnormal   Collection Time    03/22/13  9:27 PM      Result Value Range   Glucose-Capillary 195 (*) 70 - 99 mg/dL  HEPARIN LEVEL (UNFRACTIONATED)     Status: None   Collection Time    03/23/13  3:45 AM      Result Value Range   Heparin Unfractionated 0.48  0.30 - 0.70 IU/mL   Comment:            IF HEPARIN RESULTS ARE BELOW     EXPECTED VALUES, AND PATIENT     DOSAGE HAS BEEN CONFIRMED,     SUGGEST FOLLOW UP TESTING     OF ANTITHROMBIN III  LEVELS.  PROTIME-INR     Status: Abnormal   Collection Time    03/23/13  3:45 AM      Result Value Range   Prothrombin Time 15.3 (*) 11.6 - 15.2 seconds   INR 1.24  0.00 - 1.49  CBC     Status: Abnormal   Collection Time    03/23/13  3:45 AM      Result Value Range   WBC 10.8 (*) 4.0 - 10.5 K/uL   RBC 3.58 (*) 3.87 - 5.11 MIL/uL   Hemoglobin 10.7 (*) 12.0 - 15.0 g/dL   HCT 33.5 (*) 36.0 - 46.0 %   MCV 93.6  78.0 - 100.0 fL   MCH 29.9  26.0 - 34.0 pg   MCHC 31.9  30.0 - 36.0 g/dL   RDW 14.8  11.5 - 15.5 %   Platelets 237  150 - 400 K/uL  BASIC METABOLIC PANEL     Status: Abnormal   Collection Time    03/23/13  3:45 AM      Result Value Range   Sodium 141  135 - 145 mEq/L   Potassium 3.7  3.5 - 5.1 mEq/L   Chloride 101  96 - 112 mEq/L   CO2 31  19 - 32 mEq/L   Glucose, Bld 214 (*) 70 - 99 mg/dL   BUN 51 (*) 6 - 23 mg/dL   Creatinine, Ser 0.98  0.50 - 1.10 mg/dL   Calcium   8.9  8.4 - 10.5 mg/dL   GFR calc non Af Amer 57 (*) >90 mL/min   GFR calc Af Amer 66 (*) >90 mL/min   Comment: (NOTE)     The eGFR has been calculated using the CKD EPI equation.     This calculation has not been validated in all clinical situations.     eGFR's persistently <90 mL/min signify possible Chronic Kidney     Disease.  GLUCOSE, CAPILLARY     Status: Abnormal   Collection Time    03/23/13  8:08 AM      Result Value Range   Glucose-Capillary 208 (*) 70 - 99 mg/dL   Comment 1 Notify RN    GLUCOSE, CAPILLARY     Status: Abnormal   Collection Time    03/23/13 12:02 PM      Result Value Range   Glucose-Capillary 258 (*) 70 - 99 mg/dL  GLUCOSE, CAPILLARY     Status: Abnormal   Collection Time    03/23/13  4:13 PM      Result Value Range   Glucose-Capillary 181 (*) 70 - 99 mg/dL   Comment 1 Notify RN    GLUCOSE, CAPILLARY     Status: Abnormal   Collection Time    03/23/13  8:28 PM      Result Value Range   Glucose-Capillary 199 (*) 70 - 99 mg/dL   Comment 1 Notify RN    GLUCOSE,  CAPILLARY     Status: Abnormal   Collection Time    03/24/13  7:33 AM      Result Value Range   Glucose-Capillary 150 (*) 70 - 99 mg/dL  HEPARIN LEVEL (UNFRACTIONATED)     Status: None   Collection Time    03/24/13  7:47 AM      Result Value Range   Heparin Unfractionated 0.58  0.30 - 0.70 IU/mL   Comment:            IF HEPARIN RESULTS ARE BELOW     EXPECTED VALUES, AND PATIENT     DOSAGE HAS BEEN CONFIRMED,     SUGGEST FOLLOW UP TESTING     OF ANTITHROMBIN III LEVELS.  PROTIME-INR     Status: None   Collection Time    03/24/13  7:47 AM      Result Value Range   Prothrombin Time 15.1  11.6 - 15.2 seconds   INR 1.22  0.00 - 1.49  CBC     Status: Abnormal   Collection Time    03/24/13  7:47 AM      Result Value Range   WBC 11.8 (*) 4.0 - 10.5 K/uL   RBC 3.78 (*) 3.87 - 5.11 MIL/uL   Hemoglobin 11.3 (*) 12.0 - 15.0 g/dL   HCT 35.6 (*) 36.0 - 46.0 %   MCV 94.2  78.0 - 100.0 fL   MCH 29.9  26.0 - 34.0 pg   MCHC 31.7  30.0 - 36.0 g/dL   RDW 15.1  11.5 - 15.5 %   Platelets 224  150 - 400 K/uL  BASIC METABOLIC PANEL     Status: Abnormal   Collection Time    03/24/13  7:47 AM      Result Value Range   Sodium 142  135 - 145 mEq/L   Potassium 4.2  3.5 - 5.1 mEq/L   Chloride 103  96 - 112 mEq/L   CO2 30  19 - 32 mEq/L   Glucose, Bld 158 (*) 70 -   99 mg/dL   BUN 45 (*) 6 - 23 mg/dL   Creatinine, Ser 1.09  0.50 - 1.10 mg/dL   Calcium 9.2  8.4 - 10.5 mg/dL   GFR calc non Af Amer 50 (*) >90 mL/min   GFR calc Af Amer 58 (*) >90 mL/min   Comment: (NOTE)     The eGFR has been calculated using the CKD EPI equation.     This calculation has not been validated in all clinical situations.     eGFR's persistently <90 mL/min signify possible Chronic Kidney     Disease.   No results found.  Post Admission Physician Evaluation: 1. Functional deficits secondary  to deconditioning related CHF and acute on chronic respiratory failure. 2. Patient is admitted to receive collaborative,  interdisciplinary care between the physiatrist, rehab nursing staff, and therapy team. 3. Patient's level of medical complexity and substantial therapy needs in context of that medical necessity cannot be provided at a lesser intensity of care such as a SNF. 4. Patient has experienced substantial functional loss from his/her baseline which was documented above under the "Functional History" and "Functional Status" headings.  Judging by the patient's diagnosis, physical exam, and functional history, the patient has potential for functional progress which will result in measurable gains while on inpatient rehab.  These gains will be of substantial and practical use upon discharge  in facilitating mobility and self-care at the household level. 5. Physiatrist will provide 24 hour management of medical needs as well as oversight of the therapy plan/treatment and provide guidance as appropriate regarding the interaction of the two. 6. 24 hour rehab nursing will assist with bladder management, bowel management, safety, skin/wound care, disease management, medication administration, pain management and patient education  and help integrate therapy concepts, techniques,education, etc. 7. PT will assess and treat for/with: Lower extremity strength, range of motion, stamina, balance, functional mobility, safety, adaptive techniques and equipment, edema mgt, family and pt ed.   Goals are: supervision to mod I. 8. OT will assess and treat for/with: ADL's, functional mobility, safety, upper extremity strength, adaptive techniques and equipment, edema control, family and patient ed.   Goals are: mod I to set up. 9. SLP will assess and treat for/with: n/a.  Goals are: n/a. 10. Case Management and Social Worker will assess and treat for psychological issues and discharge planning. 11. Team conference will be held weekly to assess progress toward goals and to determine barriers to discharge. 12. Patient will receive at  least 3 hours of therapy per day at least 5 days per week. 13. ELOS: 7-10 days       14. Prognosis:  excellent   Medical Problem List and Plan: Deconditioning due to acute on chronic respiratory failure due to fluid overload in setting of OHS/OSA.   1. DVT Prophylaxis/Anticoagulation: Pharmaceutical: Sq lovenox 2. Chronic back pain/ Pain Management: continue Neurontin bid with tylenol prn.  3. Mood:  Will monitor for now. LCSW to follow for evaluation.  4. Neuropsych: This patient is capable of making decisions on her own behalf. 5. Leucocytosis: reactive and slowly resolving. Will monitor for fevers and other signs of infection.  6 Rectal bleeding due to Hems/irritation from rectal tube: Will monitor for recurrence as now on blood thinners. Add Anusol HC cream.  7. Acute on Chronic combined systolic/diastolic CHF:  Check daily weights. Add salt limitations--may need fluid restrictions? Continue ASA, Demadex, Coreg and Vasotec.  8. Morbid obesity with OHS/OSA:  Needs to use CPAP at nights. .    9. Acute on chronic respiratory failure: CPAP when asleep.  Encourage increase in activity to help with respiratory status.  On Pulmicort and Proventil inhalers for SOB. 2 L oxygen at all times.  10. 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 Continue Lantus 30 units daily with meal coverage and SSI for  Elevated BS.     Zachary T. Swartz, MD, FAAPMR [facility]   03/24/2013 

## 2013-03-24 NOTE — Progress Notes (Signed)
Pt. Was placed on her home CPAP auto titrate via nasal pillows with 4L O2 bled in. Pt. Is tolerating CPAP well at this time without any complications.

## 2013-03-24 NOTE — Progress Notes (Signed)
Rehab admissions - Evaluated for possible admission.  I spoke with patient and her husband at the bedside.  They would like inpatient rehab admission.  Bed available and can admit to acute inpatient rehab today.  Call me for questions.  #[phone]

## 2013-03-24 NOTE — Discharge Summary (Signed)
Physician Discharge Summary  Patient ID: [name] MRN: [id number] DOB/AGE: [birth year] 72 y.o.  Admit date: 03/05/2013 Discharge date: 03/24/2013    Discharge Diagnoses:  Acute on Chronic Respiratory Failure OHS / OSA  Pulmonary Edema Acute on Chronic Combined CHF Atrial Fibrillation Hypertension CKD Mild Hemorrhoidal Hemorrhage Morbid Obesity Protein Calorie Malnutrition Anemia Therapeutic Anticoagulation Diabetes Mellitus Hypothyroidism Deconditioning                                                                     DISCHARGE PLAN BY DIAGNOSIS     Acute on Chronic Respiratory Failure OHS / OSA  Pulmonary Edema  Discharge Plan: -oxygen for sats >90%, humidity added -CPAP with nasal pillows QHS (pt does not tolerate face mask) -may need chin strap for tolerance (can make one out of Kling wrap if not available in house) -continue albuterol / pulmicort  Acute on Chronic Combined CHF Atrial Fibrillation Hypertension  Discharge Plan: -ASA, Norvasc, Coreg, Vasotec  -Hydralazine PRN for SBP > 170 while in CIR -hold heparin gtt, coumadin 11/11.   -consult Cardiology for input on anti-coagulation -will need outpatient Cardiology follow up post Rehab   CKD  Discharge Plan: -monitor BMP / Sr Cr trend -continue torsemide / KCL daily as renal fxn permits  Mild Hemorrhoidal Hemorrhage Morbid Obesity Protein Calorie Malnutrition  Discharge Plan: -heart healthy, diabetic diet  Anemia Therapeutic Anticoagulation  Discharge Plan: -monitor H/H -hold anti-coagulation 11/11, pending Cardiology Evaluation.   -Pharmacy following for anti-coagulation needs  Diabetes Mellitus Hypothyroidism  Discharge Plan: -SSI -Lantus -Continue Synthroid -f/u TSH with PCP  Deconditioning  Discharge Plan: -CIR Placement for Rehab Efforts  Goals of Care  Discharge Plan: -DNR / DNI                DISCHARGE SUMMARY   Destiny [name] is a 72 y.o. y/o female  with a PMH of DM, Morbid Obesity, Hypothyroidism, DM with nephropathy, CKD, CAD, HTN, HLD, Ischemic Cardiomyopathy s/p BiV AICD, LBBB, oxygen dependence, and OSA (CPAP) who presented [facility] ER on 10/23 with approx one month of increased O2 needs, and 1 week of worsening SOB & cough.  She was found to have saturations on RA of 75%.  She was treated with IV diuretics and CPAP with improvement in status.  Patient found to have CHF decompensation with EF 35-40%.  Noted to have LLE cellulitis and was treated with Levaquin.  Hospital course complicated by acute on chronic kidney injury in the setting of diuesis for decompensated CHF.  She further decompensated 10/31 developing fever, hypoxia, and lethargy requiring intubation and mechanical ventilation.  She was treated empirically with IV antibiotics for HCAP.  Cultures to date are negative.  Patient passed an SBT on 11/5 but failed extubation and was reintubated.  Family had goals of care conference resulting in DNR/DNI, optimal medical treatment but no reintubation.  11/7 patient was liberated from mechanical ventilation.  Was started on IV heparin and transition to coumadin for A fib.  Unfortunately, patient had oozing from nose, small amt of bloody sputum and bleeding from hemorrhoids.  Heparin gtt / coumadin held 11/11 with pending evaluation from Cardiology for recommendations regarding anti-coagulation.      SIGNIFICANT EVENTS / STUDIES:  10/24 -  EF 35-40% gd II diastolic dysfxn  16/10 - Self extubation, failed  11/05  -Passed SBT, failed extubation  11/06 - Goals of care conference with huband and son >>> optimize medically and extubate / no reintubation  11/07 - Passed SBT. Extubated and looks good.  11/08 -  Looks good without distress. Cognition intact. Transfer to SDU  11/10 - Palliative care team >>> DNR, no ICU readmissions, possible short term rehab   LINES / TUBES:  ETT 1031>>> 11/01 (self ext)  11/01 >>> 11/05 (planned)  11/05 >>>  11/07  L IJ CVL 10/31 >>> 11/07   CULTURES:  Urine 10/28>> NEG  Resp 10/31>> NEG  Blood 10/31>> NEG  Urine 10/31>> NEG  C diff pcr 11/3>> NEG   ANTIBIOTICS:  Levaquin 10/30 >>> 11/2  Zosyn 10/31 >>> 10/31  Vancomycin 10/30 >>> 11/2  Cefepime 10/31 >>> 11/4    Discharge Exam: Gen: Resting comfortable, no distress  HEENT: PERRL  PULM: Bilateral air entry, few bibasilar rales  CV: regular, no murmurs  AB: Soft, bowel sounds present  Ext: Trace edema bilaterally  Neuro: No focal deficits   Filed Vitals:   03/24/13 0700 03/24/13 0728 03/24/13 0730 03/24/13 0739  BP:  164/52 164/52 164/52  Pulse: 81 76 83 75  Temp:  98.1 F (36.7 C)    TempSrc:  Oral    Resp: 18 20 24 21   Height:      Weight:      SpO2: 95% 95% 95% 97%     Discharge Labs  BMET  Recent Labs Lab 03/18/13 0425 03/19/13 0520 03/20/13 0345 03/21/13 0330 03/23/13 0345  NA 150* 152* 147* 144 141  K 4.2 4.1 3.4* 3.9 3.7  CL 110 109 106 103 101  CO2 33* 33* 31 30 31   GLUCOSE 340* 342* 199* 255* 214*  BUN 66* 67* 73* 71* 51*  CREATININE 1.25* 1.22* 1.39* 1.22* 0.98  CALCIUM 9.2 9.8 8.9 9.2 8.9   CBC  Recent Labs Lab 03/21/13 1645 03/23/13 0345 03/24/13 0747  HGB 11.0* 10.7* 11.3*  HCT 33.2* 33.5* 35.6*  WBC 15.3* 10.8* 11.8*  PLT 288 237 224   Anti-Coagulation  Recent Labs Lab 03/20/13 0345 03/21/13 1345 03/22/13 0545 03/23/13 0345 03/24/13 0747  INR 2.20* 1.30 1.19 1.24 1.22   Discharge Orders   Future Appointments Provider Department Dept Phone   04/06/2013 8:15 AM [facility] [place] Office [phone]   07/16/2013 12:30 PM Ladene Artist, MD [facility] [phone]   Future Orders Complete By Expires   Diet - low sodium heart healthy  As directed    Increase activity slowly  As directed       Scheduled Meds: . albuterol  2.5 mg Nebulization QID  . amLODipine  5 mg Oral Daily  . aspirin  81 mg Oral Daily   . budesonide (PULMICORT) nebulizer solution  0.25 mg Nebulization BID  . carvedilol  6.25 mg Oral BID WC  . enalapril  10 mg Oral Daily  . feeding supplement (GLUCERNA SHAKE)  237 mL Oral Q supper  . gabapentin  200 mg Oral BID  . insulin aspart  0-15 Units Subcutaneous TID WC  . insulin aspart  0-5 Units Subcutaneous QHS  . insulin aspart  3 Units Subcutaneous TID WC  . insulin glargine  30 Units Subcutaneous Daily  . levothyroxine  25 mcg Oral QAC breakfast  . potassium chloride  20 mEq Oral Daily  .  torsemide  20 mg Oral BID  . warfarin  10 mg Oral ONCE-1800  . Warfarin - Pharmacist Dosing Inpatient   Does not apply q1800   Continuous Infusions: . heparin 1,600 Units/hr (03/24/13 0800)   PRN Meds:.fentaNYL, hydrALAZINE, loperamide, LORazepam, ondansetron (ZOFRAN) IV, phenylephrine-shark liver oil-mineral oil-petrolatum  Disposition:  [facility]  Discharged Condition: JOLISA INTRIAGO has met maximum benefit of inpatient care and is medically stable and cleared for discharge.  Patient is pending follow up as above.      Time spent on disposition:  Greater than 35 minutes.   Signed: Canary Brim, NP-C [facility] Pgr: [phone] Office: [phone]  I have personally obtained history, examined patient, evaluated and interpreted laboratory and imaging results, reviewed medical records, formulated assessment / plan and placed orders.  Lonia Farber, MD Pulmonary and Critical Care Medicine [facility] Pager: [phone]  03/24/2013, 1:03 PM

## 2013-03-24 NOTE — H&P (View-Only) (Signed)
Physical Medicine and Rehabilitation Admission H&P    Chief Complaint  Patient presents with  . Deconditioning.   HPI:  Destiny [name] is a 72 y.o. 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 Patient with increased O2 needs in the past month per reports.  She was treated for acute on chronic CHF with IV diuretics and improved. Was noted to have cellulitis LLE and started on levaquin. Diuretics discontinued due to acute on chronic renal failure. On 03/13/13, patient developed recurrent hypoxia with fever and lethargy requiring intubation for acute respiratory failure. She was started on IV antibiotics for PNA/HCAP and treated with IV diuretics for pulmonary edema. Was started on IV heparin. a She self extubated but required reintubation and was slow to wean off the vent. She  tolerated extubation on 03/20/13. Palliative care consulted for GOC and patient has elected DNR, no trach and no repeat ICU admission. Has been in process of  being transitioned to coumadin for A fib but has developed rectal bleeding due to hems as well as epistasis therefore all blood thinners stopped X 5 days per cards then decision to be made about coumadin v/s NOAC.  Therapies re-initiated and CIR recommended by rehab team.   Review of Systems  Constitutional: Negative for fever and chills.  HENT: Negative for hearing loss.   Eyes: Negative for blurred vision and double vision.  Respiratory: Positive for shortness of breath.   Cardiovascular: Positive for orthopnea.  Neurological: Negative for dizziness, tingling and headaches.   Past Medical History  Diagnosis Date  . LBBB (left bundle branch block)   . Carotid bruit     a. 10/19/2011 carotid duplex - Mild hard plaque bilaterally. Stable 40-59% bilateral ICA stenosis.   Marland Kitchen Dyslipidemia   . Hypertension   .  Chronic combined systolic and diastolic CHF (congestive heart failure)     a. EF as low as 25% in 2006;  b. EF 60-65% in 12/2011;  c. 02/2013 Echo: EF 35-40, mod mid-dist antsept HK, Gr 2 DD, mild LVH.  . Back pain, chronic     "just when I walk; mass on 3rd and 4th vertebrae right lower back"  . Cardiomyopathy, ischemic     a. 2012 s/p SJM 3231-40 Uni BiV ICD, ser # [id number].  Marland Kitchen CAD (coronary artery disease)     a. 05/2001 CABG x4: LIMA->LAD, VG->D1, VG->D2, VG->RCA.  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 CKD (chronic kidney disease), stage III   . Biventricular implantable cardiac defibrillator in situ 2007, 2012    a. 2007;  b. 2012 Gen change: SJM 3231-40 Uni BiV ICD, ser # [id number].  Marland Kitchen Hypoxemia requiring supplemental oxygen   . Candidiasis of vulva and vagina   . Hypothyroidism   . 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 Truett Perna, in remission  . History of pneumonia 12/27/11    "3 times; it's been a long time ago"  . OSA on CPAP   . Gout ~ 08/2011  . PAF (paroxysmal atrial fibrillation)     on Coumadin  . DM (diabetes mellitus), type 2 with complications     insulin dependent, retinopathy, neuropathy  . Mural thrombus of left ventricle     before 2003, while on Coumadin, No h/o  CVA  . Morbid obesity with BMI of 45.0-49.9, adult     Ht. 5'4". BMI 47.2  . Vitamin D deficiency 06/09/2012    historical  . Olecranon bursitis of right elbow 12/29/2012  . History of recurrent UTIs    Past Surgical History  Procedure Laterality Date  . Cardiac catheterization  06/03/01  . Cholecystectomy  1970  . Tubal ligation  1972  . Abdominal hysterectomy  1982  . Insert / replace / remove pacemaker  2007; 2012    w/AICD  . Coronary artery bypass graft  2003    CABG X4  . Cataract extraction      left eye   Family History  Problem Relation Age of Onset  . Kidney cancer Mother     kidney and female repo - died @ 16  . Heart  disease Father     died @ 60  . Asthma Mother   . Stroke Father     Social History: Married. Retired Financial controller. reports that she has never smoked. She has never used smokeless tobacco. She reports that she does not drink alcohol or use illicit drugs   Allergies  Allergen Reactions  . Sulfonamide Derivatives Other (See Comments)    Unknown; "childhood allergy/mother"   Medications Prior to Admission  Medication Sig Dispense Refill  . albuterol (PROVENTIL HFA;VENTOLIN HFA) 108 (90 BASE) MCG/ACT inhaler Inhale 2 puffs into the lungs every 6 (six) hours as needed for wheezing or shortness of breath.  1 Inhaler  1  . allopurinol (ZYLOPRIM) 100 MG tablet Take 100 mg by mouth daily.      Marland Kitchen amLODipine (NORVASC) 5 MG tablet Take 1 tablet (5 mg total) by mouth daily.  30 tablet  3  . aspirin EC 81 MG tablet Take 81 mg by mouth daily.      . calcium-vitamin D (OSCAL WITH D) 500-200 MG-UNIT per tablet Take 1 tablet by mouth daily with breakfast.      . carvedilol (COREG) 25 MG tablet Take 50 mg by mouth 2 (two) times daily with a meal.      . citalopram (CELEXA) 20 MG tablet Take 1 tablet (20 mg total) by mouth daily.  30 tablet  3  . gabapentin (NEURONTIN) 300 MG capsule Take 1 capsule (300 mg total) by mouth 2 (two) times daily.  60 capsule  2  . insulin NPH (HUMULIN N,NOVOLIN N) 100 UNIT/ML injection Inject 25-44 Units into the skin 2 (two) times daily before a meal. Take 44 units in the morning and 25 units in the evening      . insulin regular (NOVOLIN R,HUMULIN R) 100 units/mL injection Inject 15-30 Units into the skin 2 (two) times daily before a meal. Take 30 units in the morning and 15 units in the evening      . KRILL OIL PO Take 1 capsule by mouth daily. Mega Red      . levothyroxine (SYNTHROID, LEVOTHROID) 25 MCG tablet Take 25 mcg by mouth daily before breakfast.      . LORazepam (ATIVAN) 0.5 MG tablet Take 0.5 mg by mouth at bedtime.      . Multiple Vitamins-Minerals (MULTIVITAMIN  WITH MINERALS) tablet Take 1 tablet by mouth daily.        . rosuvastatin (CRESTOR) 10 MG tablet Take 1 tablet (10 mg total) by mouth daily.  30 tablet  6  . traMADol (ULTRAM) 50 MG tablet Take 1 tablet (50 mg total) by mouth 2 (two) times  daily as needed for pain.  60 tablet  1  . Vitamin D, Ergocalciferol, (DRISDOL) 50000 UNITS CAPS capsule Take 50,000 Units by mouth every 7 (seven) days. On Tuesday      . warfarin (COUMADIN) 5 MG tablet Take 5-7.5 mg by mouth daily. Take 1 tablet on Mon, Wed, and Fri.  Take 1.5 tablet on Sun, Tues, Thurs, and Sat.      . [DISCONTINUED] enalapril (VASOTEC) 10 MG tablet Take 1 tablet (10 mg total) by mouth daily.  30 tablet  3  . [DISCONTINUED] torsemide (DEMADEX) 20 MG tablet Take 1 tablet (20 mg total) by mouth 2 (two) times daily.  120 tablet  2    Home: Home Living Family/patient expects to be discharged to:: Private residence Living Arrangements: Spouse/significant other Available Help at Discharge: Family;Available 24 hours/day Type of Home: House Home Access: Ramped entrance Home Layout: One level Home Equipment: Walker - 4 wheels Additional Comments: Pt unable to provide details of bathroom due to ETT tube   Functional History: Prior Function Comments: but fatigued quickly per chart  Functional Status:  Mobility: Bed Mobility Bed Mobility: Not assessed Transfers Transfers: Sit to Stand;Stand to Sit Sit to Stand: 3: Mod assist;With armrests;From chair/3-in-1 Sit to Stand: Patient Percentage: 60% Stand to Sit: 4: Min assist;With armrests;To chair/3-in-1 Stand to Sit: Patient Percentage: 60% Ambulation/Gait Ambulation/Gait Assistance: 4: Min assist (+1 to follow with chair) Ambulation Distance (Feet): 10 Feet Assistive device: Rolling walker Ambulation/Gait Assistance Details: mod assist x 1 during gait to recover balance with partial LOB Gait Pattern: Step-through pattern;Decreased stride length;Wide base of support;Trunk flexed Gait  velocity: decreased General Gait Details: +sob, SpO2 at 84% on 4LO2 at end of amb back from bathroom. Pt returned to 100% in 2 min Stairs: No    ADL: ADL Eating/Feeding: NPO Grooming: Wash/dry hands;Moderate assistance Where Assessed - Grooming: Supported sitting Upper Body Bathing: +1 Total assistance Where Assessed - Upper Body Bathing: Supported sitting Lower Body Bathing: +1 Total assistance Where Assessed - Lower Body Bathing: Supported sit to stand Upper Body Dressing: +1 Total assistance Where Assessed - Upper Body Dressing: Supported sitting;Unsupported sitting Lower Body Dressing: +1 Total assistance Where Assessed - Lower Body Dressing: Supported sit to Pharmacist, hospital: +2 Total assistance Toilet Transfer Method: Sit to Barista: Gaffer Method: Not assessed Transfers/Ambulation Related to ADLs: Total A +2 (and third person to manage lines) pt ~60% ADL Comments: Pt's spouse assists her with socks and shoes at home  Cognition: Cognition Overall Cognitive Status: Within Functional Limits for tasks assessed Orientation Level: Oriented X4 Cognition Arousal/Alertness: Awake/alert Behavior During Therapy: WFL for tasks assessed/performed Overall Cognitive Status: Within Functional Limits for tasks assessed Area of Impairment: Orientation;Following commands Orientation Level: Place Following Commands: Follows one step commands with increased time General Comments: Pt fatigued and difficulty maintaining full arrousal  Difficult to assess due to: Level of arousal;Intubated  Physical Exam: Blood pressure 164/52, pulse 75, temperature 98.1 F (36.7 C), temperature source Oral, resp. rate 21, height 5\' 4"  (1.626 m), weight 122.9 kg (270 lb 15.1 oz), SpO2 97.00%. Physical Exam  Nursing note and vitals reviewed. Constitutional: She is oriented to person, place, and time. She appears well-developed and well-nourished.   obese  HENT:  Head: Normocephalic and atraumatic.  Right Ear: External ear normal.  Left Ear: External ear normal.  Eyes: Conjunctivae and EOM are normal. Pupils are equal, round, and reactive to light.  Neck: No JVD present. No tracheal  deviation present. No thyromegaly present.  Cardiovascular: Normal rate and intact distal pulses.   Respiratory: Effort normal and breath sounds normal. No respiratory distress. She has no wheezes.  GI: She exhibits no distension. There is no tenderness.  Musculoskeletal: She exhibits edema (trace to 1+ edema in legs).  Lymphadenopathy:    She has no cervical adenopathy.  Neurological: She is alert and oriented to person, place, and time. She has normal reflexes. No cranial nerve deficit. Coordination normal.  Pt appropriate. Good insight and awareness. Sensation intact in all 4s except for mild decrease in distal LE's. UE strength 4-4+/5. LE 3/5 HF to 4/5 at ankles. CN non-focal.  Psychiatric: She has a normal mood and affect. Her behavior is normal. Judgment and thought content normal.    Results for orders placed during the hospital encounter of 03/05/13 (from the past 48 hour(s))  GLUCOSE, CAPILLARY     Status: Abnormal   Collection Time    03/22/13 12:26 PM      Result Value Range   Glucose-Capillary 237 (*) 70 - 99 mg/dL  GLUCOSE, CAPILLARY     Status: Abnormal   Collection Time    03/22/13  4:25 PM      Result Value Range   Glucose-Capillary 161 (*) 70 - 99 mg/dL  GLUCOSE, CAPILLARY     Status: Abnormal   Collection Time    03/22/13  9:27 PM      Result Value Range   Glucose-Capillary 195 (*) 70 - 99 mg/dL  HEPARIN LEVEL (UNFRACTIONATED)     Status: None   Collection Time    03/23/13  3:45 AM      Result Value Range   Heparin Unfractionated 0.48  0.30 - 0.70 IU/mL   Comment:            IF HEPARIN RESULTS ARE BELOW     EXPECTED VALUES, AND PATIENT     DOSAGE HAS BEEN CONFIRMED,     SUGGEST FOLLOW UP TESTING     OF ANTITHROMBIN III  LEVELS.  PROTIME-INR     Status: Abnormal   Collection Time    03/23/13  3:45 AM      Result Value Range   Prothrombin Time 15.3 (*) 11.6 - 15.2 seconds   INR 1.24  0.00 - 1.49  CBC     Status: Abnormal   Collection Time    03/23/13  3:45 AM      Result Value Range   WBC 10.8 (*) 4.0 - 10.5 K/uL   RBC 3.58 (*) 3.87 - 5.11 MIL/uL   Hemoglobin 10.7 (*) 12.0 - 15.0 g/dL   HCT 04.5 (*) 40.9 - 81.1 %   MCV 93.6  78.0 - 100.0 fL   MCH 29.9  26.0 - 34.0 pg   MCHC 31.9  30.0 - 36.0 g/dL   RDW 91.4  78.2 - 95.6 %   Platelets 237  150 - 400 K/uL  BASIC METABOLIC PANEL     Status: Abnormal   Collection Time    03/23/13  3:45 AM      Result Value Range   Sodium 141  135 - 145 mEq/L   Potassium 3.7  3.5 - 5.1 mEq/L   Chloride 101  96 - 112 mEq/L   CO2 31  19 - 32 mEq/L   Glucose, Bld 214 (*) 70 - 99 mg/dL   BUN 51 (*) 6 - 23 mg/dL   Creatinine, Ser 2.13  0.50 - 1.10 mg/dL   Calcium  8.9  8.4 - 10.5 mg/dL   GFR calc non Af Amer 57 (*) >90 mL/min   GFR calc Af Amer 66 (*) >90 mL/min   Comment: (NOTE)     The eGFR has been calculated using the CKD EPI equation.     This calculation has not been validated in all clinical situations.     eGFR's persistently <90 mL/min signify possible Chronic Kidney     Disease.  GLUCOSE, CAPILLARY     Status: Abnormal   Collection Time    03/23/13  8:08 AM      Result Value Range   Glucose-Capillary 208 (*) 70 - 99 mg/dL   Comment 1 Notify RN    GLUCOSE, CAPILLARY     Status: Abnormal   Collection Time    03/23/13 12:02 PM      Result Value Range   Glucose-Capillary 258 (*) 70 - 99 mg/dL  GLUCOSE, CAPILLARY     Status: Abnormal   Collection Time    03/23/13  4:13 PM      Result Value Range   Glucose-Capillary 181 (*) 70 - 99 mg/dL   Comment 1 Notify RN    GLUCOSE, CAPILLARY     Status: Abnormal   Collection Time    03/23/13  8:28 PM      Result Value Range   Glucose-Capillary 199 (*) 70 - 99 mg/dL   Comment 1 Notify RN    GLUCOSE,  CAPILLARY     Status: Abnormal   Collection Time    03/24/13  7:33 AM      Result Value Range   Glucose-Capillary 150 (*) 70 - 99 mg/dL  HEPARIN LEVEL (UNFRACTIONATED)     Status: None   Collection Time    03/24/13  7:47 AM      Result Value Range   Heparin Unfractionated 0.58  0.30 - 0.70 IU/mL   Comment:            IF HEPARIN RESULTS ARE BELOW     EXPECTED VALUES, AND PATIENT     DOSAGE HAS BEEN CONFIRMED,     SUGGEST FOLLOW UP TESTING     OF ANTITHROMBIN III LEVELS.  PROTIME-INR     Status: None   Collection Time    03/24/13  7:47 AM      Result Value Range   Prothrombin Time 15.1  11.6 - 15.2 seconds   INR 1.22  0.00 - 1.49  CBC     Status: Abnormal   Collection Time    03/24/13  7:47 AM      Result Value Range   WBC 11.8 (*) 4.0 - 10.5 K/uL   RBC 3.78 (*) 3.87 - 5.11 MIL/uL   Hemoglobin 11.3 (*) 12.0 - 15.0 g/dL   HCT 62.1 (*) 30.8 - 65.7 %   MCV 94.2  78.0 - 100.0 fL   MCH 29.9  26.0 - 34.0 pg   MCHC 31.7  30.0 - 36.0 g/dL   RDW 84.6  96.2 - 95.2 %   Platelets 224  150 - 400 K/uL  BASIC METABOLIC PANEL     Status: Abnormal   Collection Time    03/24/13  7:47 AM      Result Value Range   Sodium 142  135 - 145 mEq/L   Potassium 4.2  3.5 - 5.1 mEq/L   Chloride 103  96 - 112 mEq/L   CO2 30  19 - 32 mEq/L   Glucose, Bld 158 (*) 70 -  99 mg/dL   BUN 45 (*) 6 - 23 mg/dL   Creatinine, Ser 1.61  0.50 - 1.10 mg/dL   Calcium 9.2  8.4 - 09.6 mg/dL   GFR calc non Af Amer 50 (*) >90 mL/min   GFR calc Af Amer 58 (*) >90 mL/min   Comment: (NOTE)     The eGFR has been calculated using the CKD EPI equation.     This calculation has not been validated in all clinical situations.     eGFR's persistently <90 mL/min signify possible Chronic Kidney     Disease.   No results found.  Post Admission Physician Evaluation: 1. Functional deficits secondary  to deconditioning related CHF and acute on chronic respiratory failure. 2. Patient is admitted to receive collaborative,  interdisciplinary care between the physiatrist, rehab nursing staff, and therapy team. 3. Patient's level of medical complexity and substantial therapy needs in context of that medical necessity cannot be provided at a lesser intensity of care such as a SNF. 4. Patient has experienced substantial functional loss from his/her baseline which was documented above under the "Functional History" and "Functional Status" headings.  Judging by the patient's diagnosis, physical exam, and functional history, the patient has potential for functional progress which will result in measurable gains while on inpatient rehab.  These gains will be of substantial and practical use upon discharge  in facilitating mobility and self-care at the household level. 5. Physiatrist will provide 24 hour management of medical needs as well as oversight of the therapy plan/treatment and provide guidance as appropriate regarding the interaction of the two. 6. 24 hour rehab nursing will assist with bladder management, bowel management, safety, skin/wound care, disease management, medication administration, pain management and patient education  and help integrate therapy concepts, techniques,education, etc. 7. PT will assess and treat for/with: Lower extremity strength, range of motion, stamina, balance, functional mobility, safety, adaptive techniques and equipment, edema mgt, family and pt ed.   Goals are: supervision to mod I. 8. OT will assess and treat for/with: ADL's, functional mobility, safety, upper extremity strength, adaptive techniques and equipment, edema control, family and patient ed.   Goals are: mod I to set up. 9. SLP will assess and treat for/with: n/a.  Goals are: n/a. 10. Case Management and Social Worker will assess and treat for psychological issues and discharge planning. 11. Team conference will be held weekly to assess progress toward goals and to determine barriers to discharge. 12. Patient will receive at  least 3 hours of therapy per day at least 5 days per week. 13. ELOS: 7-10 days       14. Prognosis:  excellent   Medical Problem List and Plan: Deconditioning due to acute on chronic respiratory failure due to fluid overload in setting of OHS/OSA.   1. DVT Prophylaxis/Anticoagulation: Pharmaceutical: Sq lovenox 2. Chronic back pain/ Pain Management: continue Neurontin bid with tylenol prn.  3. Mood:  Will monitor for now. LCSW to follow for evaluation.  4. Neuropsych: This patient is capable of making decisions on her own behalf. 5. Leucocytosis: reactive and slowly resolving. Will monitor for fevers and other signs of infection.  6 Rectal bleeding due to Hems/irritation from rectal tube: Will monitor for recurrence as now on blood thinners. Add Anusol HC cream.  7. Acute on Chronic combined systolic/diastolic CHF:  Check daily weights. Add salt limitations--may need fluid restrictions? Continue ASA, Demadex, Coreg and Vasotec.  8. Morbid obesity with OHS/OSA:  Needs to use CPAP at nights. Marland Kitchen  9. Acute on chronic respiratory failure: CPAP when asleep.  Encourage increase in activity to help with respiratory status.  On Pulmicort and Proventil inhalers for SOB. 2 L oxygen at all times.  10. 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 Continue Lantus 30 units daily with meal coverage and SSI for  Elevated BS.     Ranelle Oyster, MD, [facility] Physical Medicine & Rehabilitation   03/24/2013

## 2013-03-24 NOTE — Progress Notes (Addendum)
ANTICOAGULATION CONSULT NOTE - Follow Up Consult  Pharmacy Consult:  Heparin / Coumaidn Indication: atrial fibrillation  Allergies  Allergen Reactions  . Sulfonamide Derivatives Other (See Comments)    Unknown; "childhood allergy/mother"    Patient Measurements: Height: 5\' 4"  (162.6 cm) Weight: 270 lb 15.1 oz (122.9 kg) IBW/kg (Calculated) : 54.7 Heparin Dosing Weight: 85 kg  Vital Signs: Temp: 98.1 F (36.7 C) (11/11 0728) Temp src: Oral (11/11 0728) BP: 164/52 mmHg (11/11 0739) Pulse Rate: 75 (11/11 0739)  Labs:  Recent Labs  03/21/13 1645 03/22/13 0545 03/23/13 0345 03/24/13 0747  HGB 11.0*  --  10.7* 11.3*  HCT 33.2*  --  33.5* 35.6*  PLT 288  --  237 224  LABPROT  --  14.8 15.3* 15.1  INR  --  1.19 1.24 1.22  HEPARINUNFRC  --  0.50 0.48 0.58  CREATININE  --   --  0.98  --     Estimated Creatinine Clearance: 69.1 ml/min (by C-G formula based on Cr of 0.98).      Assessment: 60 YOF with Afib to continue on IV heparin bridge while INR is sub-therapeutic on Coumadin.  Heparin level remains therapeutic and stable. INR has trended down slightly. Doses have been charted. Merry Proud stated that there is some bleeding so anticoag will be held until re-eval by cards for anticoag.   Goal of Therapy:  INR 2-3 Heparin level 0.3-0.7 Monitor platelets by anticoagulation protocol: Yes    Plan:  Hold coumadin and heparin - Daily HL / CBC / PT / INR

## 2013-03-24 NOTE — Progress Notes (Signed)
Physical Therapy Treatment Patient Details Name: Destiny [name] MRN: [id number] DOB: 1940/10/09 Today's Date: 03/24/2013 Time: 9147-8295 PT Time Calculation (min): 29 min  PT Assessment / Plan / Recommendation  History of Present Illness 72 yo female with h/o chf, s/p cabg, ckd, aicd, on home oxygen 2 L [place] cont (this has increased over the last month, was prev only prn during day) comes in with several days of worsening sob and cough. On 10/31 developed increased fever, increased work of breathing and delirium.  Pt intubated 10/31-11/6   PT Comments   Pt tolerated treatment well and was able to amb. 70 feet with seated rest halfway through. She will benefit from therapy to increase her endurance and improve gait pattern and velocity.  Follow Up Recommendations  CIR     Does the patient have the potential to tolerate intense rehabilitation     Barriers to Discharge        Equipment Recommendations  Rolling walker with 5" wheels    Recommendations for Other Services    Frequency Min 3X/week   Progress towards PT Goals Progress towards PT goals: Progressing toward goals  Plan Current plan remains appropriate    Precautions / Restrictions Precautions Precautions: Fall Restrictions Weight Bearing Restrictions: No   Pertinent Vitals/Pain SpO2 at 90% on 4L after ambulating 35 feet, returned to 98% after seated rest for 2 mins.    Mobility  Bed Mobility Bed Mobility: Not assessed Details for Bed Mobility Assistance: pt in chair on arrival Transfers Transfers: Sit to Stand;Stand to Sit Sit to Stand: 4: Min assist;From chair/3-in-1;From bed Stand to Sit: 4: Min assist;To chair/3-in-1;To bed Details for Transfer Assistance: Transfersx3. Cued for scoot to edge of chair and stood with 2W walker; pivotted to prepare for ambulation and had significant posterior loss of balance requiring max assist  to safely lower to sitting on bed; stood from bed and began  ambulating Ambulation/Gait Ambulation/Gait Assistance: 4: Min assist Ambulation Distance (Feet): 70 Feet Assistive device: Rolling walker Ambulation/Gait Assistance Details: ambulated 35'x2 with rest in chair for 2 mins in between with min assist Gait Pattern: Step-through pattern;Decreased stride length;Wide base of support;Trunk flexed Gait velocity: decreased General Gait Details: SpO2 at 90% on 4LO2 after 35'; rested in chair and SpO2 back up to 98% Stairs: No    Exercises General Exercises - Lower Extremity Long Arc Quad: AROM;Right;Left;15 reps Hip Flexion/Marching: AROM;Right;Left;15 reps Toe Raises: AROM;Right;Left;15 reps Heel Raises: AROM;Other reps (comment);Both (30 reps)   PT Diagnosis:    PT Problem List:   PT Treatment Interventions:     PT Goals (current goals can now be found in the care plan section) Acute Rehab PT Goals Patient Stated Goal: return home with spouse PT Goal Formulation: With patient Time For Goal Achievement: 04/07/13 Potential to Achieve Goals: Good  Visit Information  Last PT Received On: 03/24/13 Assistance Needed: +2 History of Present Illness: 72 yo female with h/o chf, s/p cabg, ckd, aicd, on home oxygen 2 L  cont (this has increased over the last month, was prev only prn during day) comes in with several days of worsening sob and cough. On 10/31 developed increased fever, increased work of breathing and delirium.  Pt intubated 10/31-11/6    Subjective Data  Patient Stated Goal: return home with spouse   Cognition  Cognition Arousal/Alertness: Awake/alert Behavior During Therapy: WFL for tasks assessed/performed Overall Cognitive Status: Within Functional Limits for tasks assessed    Balance  Balance Balance Assessed: No  End  of Session PT - End of Session Equipment Utilized During Treatment: Gait belt;Oxygen Activity Tolerance: Patient limited by fatigue Patient left: in chair;with family/visitor present   GP     Willette Pa SPT 03/24/2013, 1:50 PM

## 2013-03-24 NOTE — Consult Note (Signed)
CARDIOLOGY CONSULT NOTE   [name] MRN: [id number] DOB/AGE: June 23, 1940 72 y.o.  Admit date: 03/05/2013  Primary Physician   Danise Edge, MD Primary Cardiologist  Dr. Ladona Ridgel Reason for Consultation   Atrial Fibrillation anticoagulation  HPI: Patient is a 72 year old female with a positive PMH of chronic combined systolic.diastolic CHF (echo 03/06/13: EF 35-40%), Ischemic cardiomyopathy s/p CABG x4 (2003), HTN, a biventricular ICD in situ (2007/2012), and paroxysmal atrial fibrillation who presented to the [facility] ED on 03/05/13 with SOB. The patient's SOB was subsequently treated w/ diuretics and CPAP, but the patient's hospital course was complicated by acute on chronic kidney injury 2/2 diuresis for decompensated CHF. The patient continued to decompensate and required mechanical ventilation on 10/31. The patient was weaned off ventilation on 11/7, and in preparation for discharge, CCM began to bridge her from Heparin to Coumadin for her paroxysmal a-fib. The patient began experienced some "oozing from nose," a small amount of blood sputum, and bleeding from hemorrhoids.   Patient reports that she has been on Coumadin since 2003, and was placed on it after her surgery. She was not aware that she had a-fib, and can not tell when her heart is out of rhythm. She denies any previous bleeding on anticoagulation therapy, hx of falls, or hx of blood clots.    Past Medical History  Diagnosis Date  . LBBB (left bundle branch block)   . Carotid bruit     a. 10/19/2011 carotid duplex - Mild hard plaque bilaterally. Stable 40-59% bilateral ICA stenosis.   Marland Kitchen Dyslipidemia   . Hypertension   . Chronic combined systolic and diastolic CHF (congestive heart failure)     a. EF as low as 25% in 2006;  b. EF 60-65% in 12/2011;  c. 02/2013 Echo: EF 35-40, mod mid-dist antsept HK, Gr 2 DD, mild LVH.  . Back pain, chronic     "just when I walk; mass on 3rd and 4th vertebrae right lower  back"  . Cardiomyopathy, ischemic     a. 2012 s/p SJM 3231-40 Uni BiV ICD, ser # [id number].  Marland Kitchen CAD (coronary artery disease)     a. 05/2001 CABG x4: LIMA->LAD, VG->D1, VG->D2, VG->RCA.  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 CKD (chronic kidney disease), stage III   . Biventricular implantable cardiac defibrillator in situ 2007, 2012    a. 2007;  b. 2012 Gen change: SJM 3231-40 Uni BiV ICD, ser # [id number].  Marland Kitchen Hypoxemia requiring supplemental oxygen   . Candidiasis of vulva and vagina   . Hypothyroidism   . 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 Truett Perna, in remission  . History of pneumonia 12/27/11    "3 times; it's been a long time ago"  . OSA on CPAP   . Gout ~ 08/2011  . PAF (paroxysmal atrial fibrillation)     on Coumadin  . DM (diabetes mellitus), type 2 with complications     insulin dependent, retinopathy, neuropathy  . Mural thrombus of left ventricle     before 2003, while on Coumadin, No h/o CVA  . Morbid obesity with BMI of 45.0-49.9, adult     Ht. 5'4". BMI 47.2  . Vitamin D deficiency 06/09/2012    historical  . Olecranon bursitis of right elbow 12/29/2012  . History of recurrent UTIs      Past Surgical History  Procedure Laterality Date  .  Cardiac catheterization  06/03/01  . Cholecystectomy  1970  . Tubal ligation  1972  . Abdominal hysterectomy  1982  . Insert / replace / remove pacemaker  2007; 2012    w/AICD  . Coronary artery bypass graft  2003    CABG X4  . Cataract extraction      left eye    Allergies  Allergen Reactions  . Sulfonamide Derivatives Other (See Comments)    Unknown; "childhood allergy/mother"    I have reviewed the patient's current medications . albuterol  2.5 mg Nebulization QID  . amLODipine  5 mg Oral Daily  . aspirin  81 mg Oral Daily  . budesonide (PULMICORT) nebulizer solution  0.25 mg Nebulization BID  . carvedilol  6.25 mg Oral BID WC  . enalapril  10 mg  Oral Daily  . feeding supplement (GLUCERNA SHAKE)  237 mL Oral Q supper  . fluticasone  2 spray Each Nare Daily  . gabapentin  200 mg Oral BID  . insulin aspart  0-15 Units Subcutaneous TID WC  . insulin aspart  0-5 Units Subcutaneous QHS  . insulin aspart  3 Units Subcutaneous TID WC  . insulin glargine  30 Units Subcutaneous Daily  . levothyroxine  25 mcg Oral QAC breakfast  . potassium chloride  20 mEq Oral Daily  . torsemide  20 mg Oral BID  . Warfarin - Pharmacist Dosing Inpatient   Does not apply q1800     fentaNYL, hydrALAZINE, loperamide, LORazepam, ondansetron (ZOFRAN) IV, phenylephrine, phenylephrine-shark liver oil-mineral oil-petrolatum, sodium chloride  Medication Sig  albuterol (PROVENTIL HFA;VENTOLIN HFA) 108 (90 BASE) MCG/ACT inhaler Inhale 2 puffs into the lungs every 6 (six) hours as needed for wheezing or shortness of breath.  allopurinol (ZYLOPRIM) 100 MG tablet Take 100 mg by mouth daily.  amLODipine (NORVASC) 5 MG tablet Take 1 tablet (5 mg total) by mouth daily.  aspirin EC 81 MG tablet Take 81 mg by mouth daily.  calcium-vitamin D (OSCAL WITH D) 500-200 MG-UNIT per tablet Take 1 tablet by mouth daily with breakfast.  carvedilol (COREG) 25 MG tablet Take 50 mg by mouth 2 (two) times daily with a meal.  citalopram (CELEXA) 20 MG tablet Take 1 tablet (20 mg total) by mouth daily.  gabapentin (NEURONTIN) 300 MG capsule Take 1 capsule (300 mg total) by mouth 2 (two) times daily.  insulin NPH (HUMULIN N,NOVOLIN N) 100 UNIT/ML injection Inject 25-44 Units into the skin 2 (two) times daily before a meal. Take 44 units in the morning and 25 units in the evening  insulin regular (NOVOLIN R,HUMULIN R) 100 units/mL injection Inject 15-30 Units into the skin 2 (two) times daily before a meal. Take 30 units in the morning and 15 units in the evening  KRILL OIL PO Take 1 capsule by mouth daily. Mega Red  levothyroxine (SYNTHROID, LEVOTHROID) 25 MCG tablet Take 25 mcg by mouth  daily before breakfast.  LORazepam (ATIVAN) 0.5 MG tablet Take 0.5 mg by mouth at bedtime.  Multiple Vitamins-Minerals (MULTIVITAMIN WITH MINERALS) tablet Take 1 tablet by mouth daily.    rosuvastatin (CRESTOR) 10 MG tablet Take 1 tablet (10 mg total) by mouth daily.  traMADol (ULTRAM) 50 MG tablet Take 1 tablet (50 mg total) by mouth 2 (two) times daily as needed for pain.  Vitamin D, Ergocalciferol, (DRISDOL) 50000 UNITS CAPS capsule Take 50,000 Units by mouth every 7 (seven) days. On Tuesday  warfarin (COUMADIN) 5 MG tablet Take 5-7.5 mg by mouth daily. Take 1  tablet on Mon, Wed, and Fri.  Take 1.5 tablet on Sun, Tues, Thurs, and Sat.  enalapril (VASOTEC) 10 MG tablet Take 1 tablet (10 mg total) by mouth daily.  levofloxacin (LEVAQUIN) 500 MG tablet Take 1 tablet (500 mg total) by mouth every other day.  torsemide (DEMADEX) 20 MG tablet Take 1 tablet (20 mg total) by mouth daily.     History   Social History  . Marital Status: Married    Spouse Name: N/A    Number of Children: Y  . Years of Education: N/A   Occupational History  . 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    Social History Main Topics  . Smoking status: Never Smoker   . Smokeless tobacco: Never Used  . Alcohol Use: No  . Drug Use: No  . Sexual Activity: Not Currently   Other Topics Concern  . Not on file   Social History Narrative   Lives in [place] with her husband.  She does not routinely exercise or adhere to any particular diet.      Family Status  Relation Status Death Age  . Mother Deceased     Hx CAD  . Father Deceased    Family History  Problem Relation Age of Onset  . Kidney cancer Mother     kidney and female repo - died @ 62  . Heart disease Father     died @ 59  . Asthma Mother   . Stroke Father      ROS:  Full 14 point review of systems complete and found to be negative unless listed above.  Physical Exam: Blood pressure 148/50, pulse 68, temperature 98.3 F (36.8 C),  temperature source Oral, resp. rate 13, height 5\' 4"  (1.626 m), weight 270 lb 15.1 oz (122.9 kg), SpO2 100.00%.  General: Well developed, well nourished, obese female in no acute distress Head: Eyes PERRLA, No xanthomas. Normocephalic and atraumatic, oropharynx without edema or exudate. Lungs: Decreased breath sounds bases, upper airway wheeze Heart: HRRR S1 S2, no rub/gallop, no murmur. pulses are 2+ extrem.   Neck: No carotid bruits noted - difficult to assess due to body habitus, minimal JVD Abdomen: Bowel sounds present, abdomen soft and non-tender without masses or hernias noted. Extremities: No clubbing or cyanosis. No edema.  Neuro: Alert and oriented X 3. No focal deficits noted. Psych:  Good affect, responds appropriately Skin: No rashes or lesions noted.  Labs:   Lab Results  Component Value Date   WBC 11.8* 03/24/2013   HGB 11.3* 03/24/2013   HCT 35.6* 03/24/2013   MCV 94.2 03/24/2013   PLT 224 03/24/2013    Recent Labs  03/24/13 0747  INR 1.22     Recent Labs Lab 03/24/13 0747  NA 142  K 4.2  CL 103  CO2 30  BUN 45*  CREATININE 1.09  CALCIUM 9.2  GLUCOSE 158*   Pro B Natriuretic peptide (BNP)  Date/Time Value Range Status  03/21/2013  3:30 AM 6000.0* 0 - 125 pg/mL Final  03/20/2013  3:45 AM 5660.0* 0 - 125 pg/mL Final    Echo: (03/06/13) - Left ventricle: The cavity size was at the upper limits of normal. Wall thickness was increased in a pattern of mild LVH. Systolic function was moderately reduced. The estimated ejection fraction was in the range of 35% to 40%. There is moderate hypokinesis of the mid-distalanteroseptal myocardium. Features are consistent with a pseudonormal left ventricular filling pattern, with concomitant abnormal relaxation  and increased filling pressure (grade 2 diastolic dysfunction). - Mitral valve: Calcified annulus. - Left atrium: The atrium was mildly dilated. - Right atrium: The atrium was mildly dilated.   ECG:   03/05/2013 SR, Vpacing Vent. rate 75 BPM PR interval 156 ms QRS duration 130 ms QT/QTc 436/486 ms P-R-T axes 51 244 68 Atrial-sensed ventricular-paced rhythm Biventricular pacemaker detected Abnormal ECG No significant change since last tracing  Radiology:  EXAM: PORTABLE CHEST - 1 VIEW COMPARISON: 03/20/2013 FINDINGS: Endotracheal tube has been removed. Lung expansion is improved bilaterally. There remains interstitial edema which shows improvement, especially since 03/19/2013. No large pleural effusions or significant airspace consolidation are identified. The heart size is stable.  IMPRESSION: Improved lung expansion bilaterally with some residual interstitial edema remaining.  ASSESSMENT AND PLAN:   Principal Problem:   Acute on chronic combined systolic and diastolic heart failure Active Problems:   DIABETES MELLITUS, WITH AUTONOMIC NEUROPATHY   HYPERTENSION   LBBB   ATRIAL FIBRILLATION, PAROXYSMAL   ICD - IN SITU   OSA (obstructive sleep apnea)   History of Cardiomyopathy, ischemic   CKD (chronic kidney disease) stage 3, GFR 30-59 ml/min   Acute respiratory failure with hypoxia   CAP (community acquired pneumonia)   Hypothyroidism   Hyperkalemia   Pulmonary edema   DNR (do not resuscitate)  1.) Acute on chronic combined systolic/diastolic CHF - Continue home torsemide per CCM as renal function permits - Continue home Norvasc, Coreg, and Vasotec - F/u outpatient with Dr. Ladona Ridgel  2.) Paroxysmal A-Fib - Patient is currently in an atrial-sensed ventricular-paced rhythm - Rate is currently controlled at 68 - She is in atrial fib 3.7 % of the time per her last device interrogation.  3.) Anticoagulation - For now, can hold off on oral anticoagulation. Allow her to recover a bit more. Use DVT Lovenox. Would d/c ASA as she has had no acute ischemic issues since her CABG. Next week, Dr. Ladona Ridgel to see and decide on long-term anticoagulation.  - Consider apixaban  or rivaroxaban for anticoagulation therapy, Pt is interested in this as her coumadin level has been chronically difficult to regulate. Will ask case manager to see if copay is very high for a NOAC. If so, coumadin is best option.   SignedTheodore Demark, PA-C 03/24/2013 1:26 PM Beeper [phone] Patient seen and examined. I agree with the assessment and plan as detailed above. See also my additional thoughts below.   I have personally reviewed a great deal of the patient's history. I have discussed all the information with Theodore Demark PA-C. The main issues at this time relate to the use of aspirin and anticoagulants in this patient. She is recovering from her prolonged illness. While on aspirin and trying to get her back on Coumadin she has been having oozing from her nose and hemorrhoids. In this regard I feel it is most appropriate to stop her anticoagulation for the next several days. The plan will be to get her back on an anticoagulant for her atrial fibrillation and plan to not restart aspirin. The choice of anticoagulant will depend on several factors. Because her INR has been difficult to control and because she can receive medicines reliably from her family, it seems it would be most appropriate to switch her to a NOAC. However first we have to be sure that cost is not prohibitive. As of today aspirin and Coumadin will be stopped. We will use appropriate measures for DVT prophylaxis. After approximately 5 days if she is  not having any further oozing of blood, an anticoagulant can be started.  Willa Rough, MD, [facility] 03/24/2013 1:59 PM

## 2013-03-24 NOTE — Progress Notes (Signed)
Seen and agree with SPT note Karris Deangelo Tabor Eleena Grater, PT [phone]  

## 2013-03-24 NOTE — PMR Pre-admission (Signed)
PMR Admission Coordinator Pre-Admission Assessment  Patient: Destiny [name] is an 72 y.o., female MRN: [id number] DOB: 1941/01/04 Height: 5\' 4"  (162.6 cm) Weight: 122.9 kg (270 lb 15.1 oz)              Insurance Information HMO:      PPO:       PCP:       IPA:       80/20:       OTHER:   PRIMARY: Medicare A/B      Policy#: [id number]      Subscriber: Jeral Pinch CM Name:        Phone#:       Fax#:   Pre-Cert#:        Employer: Retired Benefits:  Phone #:       Name: Armed forces technical officer. Date: 11/12/03     Deduct: $1216      Out of Pocket Max: none      Life Max: unlimited CIR: 100%      SNF: 100 days Outpatient: 80%     Co-Pay: 20% Home Health: 100%      Co-Pay: none DME: 80%     Co-Pay: 20% Providers: patient's choice  SECONDARY: [employer] supp      Policy#: [id number]      Subscriber: Jeral Pinch CM Name:        Phone#:       Fax#:   Pre-Cert#:        Employer: Retired Benefits:  Phone #: [phone]     Name:   Eff. Date:       Deduct:        Out of Pocket Max:        Life Max:   CIR:        SNF:   Outpatient:       Co-Pay:   Home Health:        Co-Pay:   DME:       Co-Pay:    Emergency Contact Information Contact Information   Name Relation Home Work Mobile   Solomons Spouse [phone]  [phone]     Current Medical History  Patient Admitting Diagnosis: Deconditioning related to CHF and multiple medical   History of Present Illness: A 72 y.o. 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 Patient with increased O2 needs in the past month per reports. She was treated for acute on chronic CHF with IV diuretics and improved. Was noted to have cellulitis LLE and started on levaquin. Diuretics discontinued due to acute on chronic renal failure. On 03/13/13, patient developed recurrent hypoxia with fever and  lethargy requiring intubation for acute respiratory failure. She was started on IV antibiotics for PNA/HCAP and treated with IV diuretics for pulmonary edema. Was started on IV heparin and is being transitioned to coumadin for A fib. She self extubated but required reintubation and was slow to wean off the vent. She tolerated extubation on 03/20/13. Palliative care consulted for GOC and patient has elected DNR, no trach and no repeat ICU admission. Therapies re-initiated and CIR recommended by rehab team.     Past Medical History  Past Medical History  Diagnosis Date  . LBBB (left bundle branch block)   . Carotid bruit     a. 10/19/2011 carotid duplex - Mild hard plaque bilaterally. Stable  40-59% bilateral ICA stenosis.   Marland Kitchen Dyslipidemia   . Hypertension   . Chronic combined systolic and diastolic CHF (congestive heart failure)     a. EF as low as 25% in 2006;  b. EF 60-65% in 12/2011;  c. 02/2013 Echo: EF 35-40, mod mid-dist antsept HK, Gr 2 DD, mild LVH.  . Back pain, chronic     "just when I walk; mass on 3rd and 4th vertebrae right lower back"  . Cardiomyopathy, ischemic     a. 2012 s/p SJM 3231-40 Uni BiV ICD, ser # [id number].  Marland Kitchen CAD (coronary artery disease)     a. 05/2001 CABG x4: LIMA->LAD, VG->D1, VG->D2, VG->RCA.  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 CKD (chronic kidney disease), stage III   . Biventricular implantable cardiac defibrillator in situ 2007, 2012    a. 2007;  b. 2012 Gen change: SJM 3231-40 Uni BiV ICD, ser # [id number].  Marland Kitchen Hypoxemia requiring supplemental oxygen   . Candidiasis of vulva and vagina   . Hypothyroidism   . 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 Truett Perna, in remission  . History of pneumonia 12/27/11    "3 times; it's been a long time ago"  . OSA on CPAP   . Gout ~ 08/2011  . PAF (paroxysmal atrial fibrillation)     on Coumadin  . DM (diabetes mellitus), type 2 with complications      insulin dependent, retinopathy, neuropathy  . Mural thrombus of left ventricle     before 2003, while on Coumadin, No h/o CVA  . Morbid obesity with BMI of 45.0-49.9, adult     Ht. 5'4". BMI 47.2  . Vitamin D deficiency 06/09/2012    historical  . Olecranon bursitis of right elbow 12/29/2012  . History of recurrent UTIs     Family History  family history includes Asthma in her mother; Heart disease in her father; Kidney cancer in her mother; Stroke in her father.  Prior Rehab/Hospitalizations: Had heart surgery in 2003 followed by cardiac rehab.   Current Medications  Current facility-administered medications:albuterol (PROVENTIL) (5 MG/ML) 0.5% nebulizer solution 2.5 mg, 2.5 mg, Nebulization, QID, Merwyn Katos, MD, 2.5 mg at 03/24/13 1137;  amLODipine (NORVASC) tablet 5 mg, 5 mg, Oral, Daily, Merwyn Katos, MD, 5 mg at 03/24/13 1123;  aspirin chewable tablet 81 mg, 81 mg, Oral, Daily, Nelda Bucks, MD, 81 mg at 03/24/13 1123 budesonide (PULMICORT) nebulizer solution 0.25 mg, 0.25 mg, Nebulization, BID, Merwyn Katos, MD, 0.25 mg at 03/24/13 1610;  carvedilol (COREG) tablet 6.25 mg, 6.25 mg, Oral, BID WC, Merwyn Katos, MD, 6.25 mg at 03/24/13 0825;  enalapril (VASOTEC) tablet 10 mg, 10 mg, Oral, Daily, Merwyn Katos, MD, 10 mg at 03/23/13 2208 feeding supplement (GLUCERNA SHAKE) (GLUCERNA SHAKE) liquid 237 mL, 237 mL, Oral, Q supper, Ailene Ards, RD, 237 mL at 03/23/13 1750;  fentaNYL (SUBLIMAZE) injection 12.5-25 mcg, 12.5-25 mcg, Intravenous, Q2H PRN, Merwyn Katos, MD, 25 mcg at 03/24/13 0125;  fluticasone (FLONASE) 50 MCG/ACT nasal spray 2 spray, 2 spray, Each Nare, Daily, Edsel Petrin, DO gabapentin (NEURONTIN) capsule 200 mg, 200 mg, Oral, BID, Edsel Petrin, DO, 200 mg at 03/24/13 1122;  hydrALAZINE (APRESOLINE) injection 10 mg, 10 mg, Intravenous, Q2H PRN, Zigmund Gottron, MD, 10 mg at 03/23/13 0359;  insulin aspart (novoLOG) injection 0-15  Units, 0-15 Units, Subcutaneous, TID WC, Onalee Hua  Ree Kida, MD, 3 Units at 03/23/13 1749 insulin aspart (novoLOG) injection 0-5 Units, 0-5 Units, Subcutaneous, QHS, Merwyn Katos, MD, 2 Units at 03/21/13 2154;  insulin aspart (novoLOG) injection 3 Units, 3 Units, Subcutaneous, TID WC, Merwyn Katos, MD, 3 Units at 03/24/13 [phone];  insulin glargine (LANTUS) injection 30 Units, 30 Units, Subcutaneous, Daily, Lonia Farber, MD, 30 Units at 03/24/13 1124 levothyroxine (SYNTHROID, LEVOTHROID) tablet 25 mcg, 25 mcg, Oral, QAC breakfast, Merwyn Katos, MD, 25 mcg at 03/24/13 0825;  loperamide (IMODIUM) 1 MG/5ML solution 2 mg, 2 mg, Per Tube, PRN, Leona Singleton, MD, 2 mg at 03/16/13 1723;  LORazepam (ATIVAN) tablet 0.5 mg, 0.5 mg, Oral, QHS PRN, Lonia Farber, MD, 0.5 mg at 03/23/13 2208 ondansetron (ZOFRAN) injection 4 mg, 4 mg, Intravenous, Q6H PRN, Haydee Monica, MD, 4 mg at 03/23/13 0805;  phenylephrine (NEO-SYNEPHRINE) 0.5 % nasal solution 1 drop, 1 drop, Each Nare, Q6H PRN, Edsel Petrin, DO;  phenylephrine-shark liver oil-mineral oil-petrolatum (PREPARATION H) rectal ointment 1 application, 1 application, Rectal, BID PRN, Lonia Farber, MD potassium chloride SA (K-DUR,KLOR-CON) CR tablet 20 mEq, 20 mEq, Oral, Daily, Merwyn Katos, MD, 20 mEq at 03/24/13 1123;  sodium chloride (OCEAN) 0.65 % nasal spray 2 spray, 2 spray, Each Nare, PRN, Edsel Petrin, DO;  torsemide [facility]) tablet 20 mg, 20 mg, Oral, BID, Merwyn Katos, MD, 20 mg at 03/24/13 1123;  Warfarin - Pharmacist Dosing Inpatient, , Does not apply, q1800, Gardner Candle, RPH  Patients Current Diet: Carb Control  Precautions / Restrictions Precautions Precautions: Fall Precaution Comments: watch sats Restrictions Weight Bearing Restrictions: No   Prior Activity Level Limited Community (1-2x/wk): Went out 1-2 X a week to [place] and grocery store with husband.  Home Assistive Devices /  Equipment Home Assistive Devices/Equipment: None Home Equipment: Walker - 4 wheels  Prior Functional Level Prior Function Level of Independence: Independent Comments: but fatigued quickly per chart  Current Functional Level Cognition  Overall Cognitive Status: Within Functional Limits for tasks assessed Difficult to assess due to: Level of arousal;Intubated Orientation Level: Oriented X4 Following Commands: Follows one step commands with increased time General Comments: Pt fatigued and difficulty maintaining full arrousal     Extremity Assessment (includes Sensation/Coordination)          ADLs  Eating/Feeding: NPO Grooming: Wash/dry hands;Moderate assistance Where Assessed - Grooming: Supported sitting Upper Body Bathing: +1 Total assistance Where Assessed - Upper Body Bathing: Supported sitting Lower Body Bathing: +1 Total assistance Where Assessed - Lower Body Bathing: Supported sit to stand Upper Body Dressing: +1 Total assistance Where Assessed - Upper Body Dressing: Supported sitting;Unsupported sitting Lower Body Dressing: +1 Total assistance Where Assessed - Lower Body Dressing: Supported sit to Pharmacist, hospital: +2 Total assistance Toilet Transfer: Patient Percentage: 60% Toilet Transfer Method: Sit to Barista: Materials engineer and Hygiene: +1 Total assistance Where Assessed - Engineer, mining and Hygiene: Standing Tub/Shower Transfer Method: Not assessed Transfers/Ambulation Related to ADLs: Total A +2 (and third person to manage lines) pt ~60% ADL Comments: Pt's spouse assists her with socks and shoes at home    Mobility  Bed Mobility: Not assessed    Transfers  Transfers: Sit to Stand;Stand to Sit Sit to Stand: 3: Mod assist;With armrests;From chair/3-in-1 Sit to Stand: Patient Percentage: 60% Stand to Sit: 4: Min assist;With armrests;To chair/3-in-1 Stand to Sit: Patient  Percentage: 60%    Ambulation / Gait / Stairs /  Wheelchair Mobility  Ambulation/Gait Ambulation/Gait Assistance: 4: Min assist (+1 to follow with chair) Ambulation Distance (Feet): 10 Feet Assistive device: Rolling walker Ambulation/Gait Assistance Details: mod assist x 1 during gait to recover balance with partial LOB Gait Pattern: Step-through pattern;Decreased stride length;Wide base of support;Trunk flexed Gait velocity: decreased General Gait Details: +sob, SpO2 at 84% on 4LO2 at end of amb back from bathroom. Pt returned to 100% in 2 min Stairs: No    Posture / Balance Static Sitting Balance Static Sitting - Balance Support: Bilateral upper extremity supported Static Sitting - Level of Assistance: 5: Stand by assistance Static Sitting - Comment/# of Minutes: supervision once we helped pt pull forward off of recliner to sitting.   Static Standing Balance Static Standing - Balance Support: Bilateral upper extremity supported Static Standing - Level of Assistance: 1: +2 Total assist;Patient percentage (comment) (pt 60%) Static Standing - Comment/# of Minutes: <30 seconds, trunk supported and bil knees blocked.  Pt pulling with bil upper extremities Dynamic Standing Balance Dynamic Standing - Balance Support: Left upper extremity supported;During functional activity Dynamic Standing - Level of Assistance: 4: Min assist;Other (comment) (min guard A)    Special needs/care consideration BiPAP/CPAP Has CPAP at home CPM No Continuous Drip IV Heparin drip stopped due to bleeding from nose.  Patient states also bleeding from meatus. Dialysis No         Life Vest No Oxygen Has 02 at 4L [place], used 02 at home at 2-3L [place] Special Bed No Trach Size NO Wound Vac (area) No      Skin Red and irritated under skin folds.  Uses powder under folds.                               Bowel mgmt: Incontinent of loose stool.  Had BM 03/23/13 Bladder mgmt: Voiding, some incontinence at times Diabetic mgmt  Yes, on insulin at home PTA    Previous Home Environment Living Arrangements: Spouse/significant other Available Help at Discharge: Family;Available 24 hours/day Type of Home: House Home Layout: One level Home Access: Ramped entrance Bathroom Shower/Tub: Health visitor: Standard Home Care Services: No Additional Comments: Pt unable to provide details of bathroom due to ETT tube  Discharge Living Setting Plans for Discharge Living Setting: Patient's home;House;Lives with (comment) (Lives with husband.) Type of Home at Discharge: House Discharge Home Layout: One level Discharge Home Access: Ramped entrance Does the patient have any problems obtaining your medications?: No  Social/Family/Support Systems Patient Roles: Spouse;Parent (Lives with husband who is retired and can assist.) Contact Information: Gilford Rile - spouse Anticipated Caregiver: Husband Anticipated Caregiver's Contact Information: Sherryle Lis - (h) [phone] (c)  [phone] Ability/Limitations of Caregiver: Husband can assist. (3 sons live close by, all sons working.) Caregiver Availability: 24/7 Discharge Plan Discussed with Primary Caregiver: Yes Is Caregiver In Agreement with Plan?: Yes Does Caregiver/Family have Issues with Lodging/Transportation while Pt is in Rehab?: No  Goals/Additional Needs Patient/Family Goal for Rehab: PT/OT S/Min Assist, no ST goals Expected length of stay: 15-20 days Cultural Considerations: [employer] Dietary Needs: Carb mod med cal, thin liquids Equipment Needs: TBD Additional Information: Patient is a DNR and palliative care is following.  Had goals of care meeting 03/23/13. Pt/Family Agrees to Admission and willing to participate: Yes Program Orientation Provided & Reviewed with Pt/Caregiver Including Roles  & Responsibilities: Yes  Decrease burden of Care through IP rehab admission: N/A  Possible need for SNF placement  upon discharge: Not planned  Patient  Condition: This patient's condition remains as documented in the consult dated 03/23/13, in which the Rehabilitation Physician determined and documented that the patient's condition is appropriate for intensive rehabilitative care in an inpatient rehabilitation facility. Will admit to inpatient rehab today.  Preadmission Screen Completed By:  Trish Mage, 03/24/2013 12:15 PM ______________________________________________________________________   Discussed status with Dr. Riley Kill on 03/24/13 at 1213 and received telephone approval for admission today.  Admission Coordinator:  Trish Mage, time1213/Date11/11/14

## 2013-03-25 ENCOUNTER — Inpatient Hospital Stay (HOSPITAL_COMMUNITY): Payer: Medicare Other | Admitting: Physical Therapy

## 2013-03-25 ENCOUNTER — Inpatient Hospital Stay (HOSPITAL_COMMUNITY): Payer: Medicare Other | Admitting: Occupational Therapy

## 2013-03-25 DIAGNOSIS — D62 Acute posthemorrhagic anemia: Secondary | ICD-10-CM

## 2013-03-25 DIAGNOSIS — K625 Hemorrhage of anus and rectum: Secondary | ICD-10-CM | POA: Diagnosis present

## 2013-03-25 DIAGNOSIS — E669 Obesity, unspecified: Secondary | ICD-10-CM

## 2013-03-25 DIAGNOSIS — R040 Epistaxis: Secondary | ICD-10-CM | POA: Diagnosis present

## 2013-03-25 DIAGNOSIS — R5381 Other malaise: Secondary | ICD-10-CM

## 2013-03-25 DIAGNOSIS — E1165 Type 2 diabetes mellitus with hyperglycemia: Secondary | ICD-10-CM

## 2013-03-25 DIAGNOSIS — I509 Heart failure, unspecified: Secondary | ICD-10-CM

## 2013-03-25 LAB — COMPREHENSIVE METABOLIC PANEL
ALT: 43 U/L — ABNORMAL HIGH (ref 0–35)
AST: 40 U/L — ABNORMAL HIGH (ref 0–37)
Albumin: 2.9 g/dL — ABNORMAL LOW (ref 3.5–5.2)
Alkaline Phosphatase: 65 U/L (ref 39–117)
BUN: 39 mg/dL — ABNORMAL HIGH (ref 6–23)
Chloride: 100 mEq/L (ref 96–112)
GFR calc non Af Amer: 54 mL/min — ABNORMAL LOW (ref >90)
Potassium: 4.6 mEq/L (ref 3.5–5.1)
Total Bilirubin: 0.5 mg/dL (ref 0.3–1.2)

## 2013-03-25 LAB — URINE CULTURE: Culture: NO GROWTH

## 2013-03-25 LAB — CBC WITH DIFFERENTIAL/PLATELET
Basophils Absolute: 0.1 10*3/uL (ref 0.0–0.1)
Basophils Relative: 1 % (ref 0–1)
Eosinophils Absolute: 0.3 10*3/uL (ref 0.0–0.7)
Hemoglobin: 10.9 g/dL — ABNORMAL LOW (ref 12.0–15.0)
Lymphocytes Relative: 16 % (ref 12–46)
MCHC: 32.1 g/dL (ref 30.0–36.0)
Neutrophils Relative %: 68 % (ref 43–77)
RBC: 3.63 MIL/uL — ABNORMAL LOW (ref 3.87–5.11)
RDW: 15 % (ref 11.5–15.5)

## 2013-03-25 LAB — GLUCOSE, CAPILLARY
Glucose-Capillary: 195 mg/dL — ABNORMAL HIGH (ref 70–99)
Glucose-Capillary: 300 mg/dL — ABNORMAL HIGH (ref 70–99)
Glucose-Capillary: 259 mg/dL — ABNORMAL HIGH (ref 70–99)

## 2013-03-25 MED ORDER — SALINE SPRAY 0.65 % NA SOLN
2.0000 | Freq: Four times a day (QID) | NASAL | Status: DC
  Administered 2013-03-25 – 2013-03-31 (×20): 2 via NASAL
  Filled 2013-03-25: qty 44

## 2013-03-25 NOTE — Evaluation (Signed)
Physical Therapy Assessment and Plan  Patient Details  Name: Destiny [name] MRN: [id number] Date of Birth: 06-Jan-1941  PT Diagnosis: Abnormality of gait, Edema, Impaired sensation and Muscle weakness Rehab Potential: Good ELOS: 5-7 days   Today's Date: 03/25/2013 Time:  0830-0900 and 1348-1418 60 min and 30 min  Problem List:  Patient Active Problem List   Diagnosis Date Noted  . Physical deconditioning 03/25/2013  . Epistaxis 03/25/2013  . Rectal bleeding--due to hems/rectal tube irritation 03/25/2013  . DNR (do not resuscitate) 03/24/2013  . Pulmonary edema 03/17/2013  . Hyperkalemia 03/12/2013  . Bronchitis, acute 03/07/2013  . CAP (community acquired pneumonia) 03/06/2013  . Hypothyroidism 03/06/2013  . Acute on chronic combined systolic and diastolic heart failure 03/06/2013  . Acute respiratory failure with hypoxia 03/05/2013  . Olecranon bursitis of right elbow 12/29/2012  . Vitamin D deficiency 06/09/2012  . CKD (chronic kidney disease) stage 3, GFR 30-59 ml/min 12/27/2011  . History of Cardiomyopathy, ischemic   . OSA (obstructive sleep apnea) 09/05/2010  . LEG PAIN, BILATERAL 12/28/2009  . DIABETES MELLITUS, WITH AUTONOMIC NEUROPATHY 10/28/2009  . ATRIAL FIBRILLATION, PAROXYSMAL 06/21/2009  . NON-HODGKIN'S LYMPHOMA 04/21/2009  . MURAL THROMBUS, LEFT VENTRICLE 03/07/2009  . LYMPHEDEMA 03/07/2009  . DIABETIC  RETINOPATHY 08/26/2008  . LBBB 08/26/2008  . CVA 08/26/2008  . BACK PAIN, CHRONIC 08/26/2008  . ICD - IN SITU 08/26/2008  . UTI'S, RECURRENT 06/24/2008  . CAROTID BRUIT 05/20/2007  . DYSLIPIDEMIA 03/19/2007  . HYPERTENSION 11/20/2006    Past Medical History:  Past Medical History  Diagnosis Date  . LBBB (left bundle branch block)   . Carotid bruit     a. 10/19/2011 carotid duplex - Mild hard plaque bilaterally. Stable 40-59% bilateral ICA stenosis.   Marland Kitchen Dyslipidemia   . Hypertension   . Chronic combined systolic and diastolic CHF (congestive heart  failure)     a. EF as low as 25% in 2006;  b. EF 60-65% in 12/2011;  c. 02/2013 Echo: EF 35-40, mod mid-dist antsept HK, Gr 2 DD, mild LVH.  . Back pain, chronic     "just when I walk; mass on 3rd and 4th vertebrae right lower back"  . Cardiomyopathy, ischemic     a. 2012 s/p SJM 3231-40 Uni BiV ICD, ser # [id number].  Marland Kitchen CAD (coronary artery disease)     a. 05/2001 CABG x4: LIMA->LAD, VG->D1, VG->D2, VG->RCA.  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 CKD (chronic kidney disease), stage III   . Biventricular implantable cardiac defibrillator in situ 2007, 2012    a. 2007;  b. 2012 Gen change: SJM 3231-40 Uni BiV ICD, ser # [id number].  Marland Kitchen Hypoxemia requiring supplemental oxygen   . Candidiasis of vulva and vagina   . Hypothyroidism   . 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 Truett Perna, in remission  . History of pneumonia 12/27/11    "3 times; it's been a long time ago"  . OSA on CPAP   . Gout ~ 08/2011  . PAF (paroxysmal atrial fibrillation)     on Coumadin  . DM (diabetes mellitus), type 2 with complications     insulin dependent, retinopathy, neuropathy  . Mural thrombus of left ventricle     before 2003, while on Coumadin, No h/o CVA  . Morbid obesity with BMI of 45.0-49.9, adult     Ht. 5'4". BMI 47.2  . Vitamin D deficiency 06/09/2012  historical  . Olecranon bursitis of right elbow 12/29/2012  . History of recurrent UTIs    Past Surgical History:  Past Surgical History  Procedure Laterality Date  . Cardiac catheterization  06/03/01  . Cholecystectomy  1970  . Tubal ligation  1972  . Abdominal hysterectomy  1982  . Insert / replace / remove pacemaker  2007; 2012    w/AICD  . Coronary artery bypass graft  2003    CABG X4  . Cataract extraction      left eye    Assessment & Plan Clinical Impression: Destiny [name] is a 72 y.o. 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 Patient with increased O2 needs in the past month per reports. She was treated for acute on chronic CHF with IV diuretics and improved. Was noted to have cellulitis LLE and started on levaquin. Diuretics discontinued due to acute on chronic renal failure. On 03/13/13, patient developed recurrent hypoxia with fever and lethargy requiring intubation for acute respiratory failure. She was started on IV antibiotics for PNA/HCAP and treated with IV diuretics for pulmonary edema. Was started on IV heparin. a She self extubated but required reintubation and was slow to wean off the vent. She tolerated extubation on 03/20/13. Patient transferred to CIR on 03/24/2013 .   Patient currently requires min with mobility secondary to muscle weakness, decreased cardiorespiratoy endurance and decreased oxygen support and decreased standing balance, decreased postural control and decreased balance strategies.  Prior to hospitalization, patient was independent  with mobility and lived with Spouse in a House home.  Home access is  Ramped entrance.  Patient will benefit from skilled PT intervention to maximize safe functional mobility and minimize fall risk for planned discharge home with 24 hour supervision.  Anticipate patient will not need PT follow up at discharge.  PT - End of Session Activity Tolerance: Tolerates 10 - 20 min activity with multiple rests Endurance Deficit: Yes Endurance Deficit Description: requires frequent rest breaks with activity; needs encouragement for pursed lip breathing PT Assessment Rehab Potential: Good PT Patient demonstrates impairments in the following area(s): Balance;Edema;Endurance;Motor PT Transfers Functional Problem(s): Bed Mobility;Bed to Chair;Furniture;Other (comment) (Husband's Zenaida Niece has electric lift for power scooter.) PT Locomotion Functional Problem(s):  Ambulation;Wheelchair Mobility PT Plan PT Intensity: Minimum of 1-2 x/day ,45 to 90 minutes PT Frequency: 5 out of 7 days PT Duration Estimated Length of Stay: 5-7 days PT Treatment/Interventions: Ambulation/gait training;Balance/vestibular training;Discharge planning;DME/adaptive equipment instruction;Functional mobility training;Patient/family education;Neuromuscular re-education;Therapeutic Activities;Therapeutic Exercise;UE/LE Strength taining/ROM;Wheelchair propulsion/positioning PT Transfers Anticipated Outcome(s): mod I PT Locomotion Anticipated Outcome(s): supervision to mod I PT Recommendation Follow Up Recommendations: Home health PT;24 hour supervision/assistance Patient destination: Home Equipment Recommended: Rolling walker with 5" wheels;Wheelchair cushion (measurements);Wheelchair (measurements) Equipment Details: Pt owns Scientist, clinical (histocompatibility and immunogenetics). May require rolling walker at discharge.  Skilled Therapeutic Intervention Treatment Session 1: PT evaluation initiated. Pt on 2L/min supplemental O2 until SpO2 decreased to 89% after sit>stand transfer, at which time supplemental O2 was increased to 3L/min. SpO2 maintained >93% during activity for remainder of session. Pt performed sit<>stand from bedside chair with min A for anterior weight shift. Ambulation x60' with RW requiring min A; gait trial ended due to pt fatigue. Therapist verbally instructed and demonstrated w/c propulsion technique. Hand-over-hand assist provided. Pt gave effective return demonstration during linear w/c propulsion x50' in hallway with supervision,  cueing. Session ended in pt room with pt seated in bedside chair with husband present and all needs within reach.  Treatment Session 2: Patient received seated in bedside chair; agreeable to session. Pt on 2L/min supplemental O2 until SpO2 decreased to 86% after supine<>sit transfer, at which time supplemental O2 was increased to 3L/min. SpO2 maintained >95% during activity  for remainder of session. Session focused on w/c propulsion, management of w/c parts, and stair negotiation. Pt negotiated 5 stairs with bilat hand rails, step-to pattern requiring min A. Pt performed w/c propulsion via bilat UE's x75' with supervision. Hand-over-hand required for negotiating turns. Therapist instructed pt in locking/unlocking w/c brakes; pt demonstrated effective within-session carryover. Session ended in pt room with pt seated in bedside chair with all needs within reach.   PT Evaluation Precautions/Restrictions Precautions Precautions: Fall Precaution Comments: monitor O2 saturation Restrictions Weight Bearing Restrictions: No General Chart Reviewed: Yes Vital SignsOxygen Therapy SpO2: 98 % O2 Device: Nasal cannula Pain Pain Assessment Pain Assessment: 0-10 Pain Score: 4  Pain Type: Acute pain Pain Location: Hip Pain Intervention(s): Medication (See eMAR) Home Living/Prior Functioning Home Living Available Help at Discharge: Family;Available 24 hours/day Type of Home: House Home Access: Ramped entrance Home Layout: One level  Lives With: Spouse Prior Function Level of Independence: Independent with transfers;Other (comment);Independent with gait (independent with houehold mobility; motorized chair for community mobility. Required frequent rest breaks with standing activities.)  Able to Take Stairs?: No Driving: Yes Leisure: Hobbies-yes (Comment) Comments: Crossword puzzles Vision/Perception  Vision - History Baseline Vision: Wears glasses only for reading Patient Visual Report: No change from baseline Vision - Assessment Vision Assessment: Vision not tested  Cognition Overall Cognitive Status: Within Functional Limits for tasks assessed Arousal/Alertness: Awake/alert Orientation Level: Oriented X4 Safety/Judgment: Appears intact Sensation Sensation Light Touch: Impaired by gross assessment (impaired sensation in bilat hands/feet (stocking/glove  distriubution) secondary to peripheral neuropathy) Coordination Gross Motor Movements are Fluid and Coordinated: Yes Motor  Motor Motor: Other (comment) Motor - Skilled Clinical Observations: generalized weakness  Mobility Bed Mobility Bed Mobility: Rolling Right;Rolling Left;Scooting to HOB;Sit to Supine;Supine to Sit Rolling Right: 5: Supervision Rolling Left: 5: Supervision Supine to Sit: 5: Supervision;HOB flat;With rails Sit to Supine: 5: Supervision;HOB flat;With rail Transfers Transfers: Yes Sit to Stand: 4: Min assist;With armrests;From chair/3-in-1;From bed Sit to Stand Details: Manual facilitation for weight shifting Stand to Sit: 4: Min assist Stand to Sit Details (indicate cue type and reason): Verbal cues for technique Stand to Sit Details: Verbal cues for hand placement Locomotion  Ambulation Ambulation: Yes Ambulation/Gait Assistance: 4: Min assist Ambulation Distance (Feet): 60 Feet Assistive device: Rolling walker Ambulation/Gait Assistance Details: Verbal cues for precautions/safety Ambulation/Gait Assistance Details: Verbal cues for breathing (focus on pursed-lip breathing, inhalation through nose). Vitals after gait x15': SpO2 92%, HR 103. Vitals post-gait x60': SpO2 92%, HR ((. Pt on 3L supplemental O2, [place]. Gait Gait: Yes Gait Pattern: Impaired Gait Pattern: Step-through pattern;Decreased stride length;Wide base of support;Trunk flexed Gait velocity: decreased Stairs / Additional Locomotion Stairs: Yes Stairs Assistance: 4: Min assist Stair Management Technique: Two rails;Step to pattern;Forwards Number of Stairs: 5 Corporate treasurer: Yes Occupational hygienist: Both upper extremities Wheelchair Parts Management: Supervision/cueing Distance: 75  Trunk/Postural Assessment  Cervical Assessment Cervical Assessment: Within Film/video editor Assessment: Within Functional Limits Lumbar Assessment Lumbar  Assessment: Within Functional Limits Postural Control Postural Control: Deficits on evaluation Righting Reactions: delayed  Balance Balance Balance Assessed: Yes Static Sitting Balance Static Sitting - Balance Support: Bilateral  upper extremity supported Static Sitting - Level of Assistance: 5: Stand by assistance Static Sitting - Comment/# of Minutes: Supervision Static Standing Balance Static Standing - Balance Support: Bilateral upper extremity supported Static Standing - Level of Assistance: 5: Stand by assistance Dynamic Standing Balance Dynamic Standing - Balance Support: Bilateral upper extremity supported;During functional activity Dynamic Standing - Level of Assistance: 4: Min assist Extremity Assessment  RLE Assessment RLE Assessment: Exceptions to [facility] RLE Strength RLE Overall Strength: Deficits RLE Overall Strength Comments: R hip strength grossly 4-/5 in all planes. R knee/ankle strength grossly 4/5 in all planes. LLE Assessment LLE Assessment: Exceptions to [facility] LLE Strength LLE Overall Strength: Deficits LLE Overall Strength Comments: L hip strength grossly 4-/5 in all planes. L knee/ankle strength grossly 4/5 in all planes.  FIM:  FIM - Locomotion: Wheelchair Distance: 75 FIM - Locomotion: Ambulation Ambulation/Gait Assistance: 4: Min assist   Refer to Care Plan for Long Term Goals  Recommendations for other services: None  Discharge Criteria: Patient will be discharged from PT if patient refuses treatment 3 consecutive times without medical reason, if treatment goals not met, if there is a change in medical status, if patient makes no progress towards goals or if patient is discharged from hospital.  The above assessment, treatment plan, treatment alternatives and goals were discussed and mutually agreed upon: by patient and by family  Calvert Cantor 03/25/2013, 7:10 PM

## 2013-03-25 NOTE — Progress Notes (Signed)
03/25/13 0229 03/25/13 0245  Oxygen Therapy  SpO2 ! 88 % 95 %  O2 Device CPAP Nasal cannula  O2 Flow Rate (L/min) 5 L/min 2 L/min   SpO2 was checked as ordered (q4h) and O2 level stayed around 88-89% with CPAP. Patient was visibly mouth breathing.  After toileting, patient requested to wear [place] instead of CPAP. SpO2 rechecked with 2L via [place] and it maintained around 94-95%. Patient stable, now back asleep. Husband at bedside. No further interventions performed.

## 2013-03-25 NOTE — Progress Notes (Signed)
On 11/11, I spoke with case management and then with patient by phone.   Both Xarelto and Eliquis were assessed for their cost to the patient. Either agent will cost the patient more than $150 per month. This is cost-prohibitive for her.   Therefore, the only possibility for anticoagulation his Coumadin.  Theodore Demark, PA-C 03/25/2013 9:11 AM Beeper [phone]'

## 2013-03-25 NOTE — Progress Notes (Signed)
Patient information reviewed and entered into eRehab system by Murielle Stang, RN, CRRN, PPS Coordinator.  Information including medical coding and functional independence measure will be reviewed and updated through discharge.     Per nursing patient was given "Data Collection Information Summary for Patients in Inpatient Rehabilitation Facilities with attached "Privacy Act Statement-Health Care Records" upon admission.  

## 2013-03-25 NOTE — Progress Notes (Addendum)
Subjective/Complaints: Had a little shortness of breath last night while on cpm with sat at 88-89%. Got up to the recliner and felt better. nocomplaints this am. Feels that dried blood, dry nose is partly to blame A 12 point review of systems has been performed and if not noted above is otherwise negative.   Objective: Vital Signs: Blood pressure 151/75, pulse 83, temperature 98 F (36.7 C), temperature source Oral, resp. rate 20, height 5\' 4"  (1.626 m), weight 121.156 kg (267 lb 1.6 oz), SpO2 96.00%. No results found.  Recent Labs  03/24/13 0747 03/25/13 0650  WBC 11.8* PENDING  HGB 11.3* 10.9*  HCT 35.6* 34.0*  PLT 224 207    Recent Labs  03/24/13 0747 03/25/13 0650  NA 142 140  K 4.2 4.6  CL 103 100  GLUCOSE 158* 208*  BUN 45* 39*  CREATININE 1.09 1.02  CALCIUM 9.2 9.3   CBG (last 3)   Recent Labs  03/24/13 1836 03/24/13 2002 03/25/13 0721  GLUCAP 190* 230* 195*    Wt Readings from Last 3 Encounters:  03/25/13 121.156 kg (267 lb 1.6 oz)  03/20/13 122.9 kg (270 lb 15.1 oz)  01/15/13 123.741 kg (272 lb 12.8 oz)    Physical Exam:  Constitutional: She is oriented to person, place, and time. She appears well-developed and well-nourished.  Obese. Appears comfortable HENT:  Head: Normocephalic and atraumatic.  Right Ear: External ear normal.  Left Ear: External ear normal.  Eyes: Conjunctivae and EOM are normal. Pupils are equal, round, and reactive to light.  Neck: No JVD present. No tracheal deviation present. No thyromegaly present.  Cardiovascular: Normal rate and intact distal pulses. Reg rhythm Respiratory: Effort normal and breath sounds normal. No respiratory distress. She has no wheezes. Oxygen via [place] GI: She exhibits no distension. There is no tenderness.  Musculoskeletal: She exhibits edema (trace to 1+ edema in legs).  Lymphadenopathy:  She has no cervical adenopathy.  Neurological: She is alert and oriented to person, place, and time. She  has normal reflexes. No cranial nerve deficit. Coordination normal.  Pt appropriate. Good insight and awareness. Sensation intact in all 4s except for mild decrease in distal LE's/feet. UE strength 4-4+/5. LE 3/5 HF to 4/5 at ankles. CN non-focal.  Psychiatric: She has a normal mood and affect. Her behavior is normal. Judgment and thought content normal.     Assessment/Plan: 1. Functional deficits secondary to deconditioning after acute on chronic respiratory failure which require 3+ hours per day of interdisciplinary therapy in a comprehensive inpatient rehab setting. Physiatrist is providing close team supervision and 24 hour management of active medical problems listed below. Physiatrist and rehab team continue to assess barriers to discharge/monitor patient progress toward functional and medical goals. FIM:                      Expression Expression Mode: Verbal Expression: 5-Expresses basic needs/ideas: With no assist  Social Interaction Social Interaction: 6-Interacts appropriately with others with medication or extra time (anti-anxiety, antidepressant).  Problem Solving Problem Solving: 6-Solves complex problems: With extra time  Memory Memory: 6-More than reasonable amt of time  Medical Problem List and Plan:  Deconditioning due to acute on chronic respiratory failure due to fluid overload in setting of OHS/OSA.  1. DVT Prophylaxis/Anticoagulation: Pharmaceutical: Sq lovenox  2. Chronic back pain/ Pain Management: continue Neurontin bid with tylenol prn.  3. Mood: Will monitor for now. LCSW to follow for evaluation.  4. Neuropsych: This patient is capable  of making decisions on her own behalf.  5. Leucocytosis: reactive and slowly resolving. Will monitor for fevers and other signs of infection.  6 Rectal bleeding due to [place]/irritation from rectal tube: follow hgb and symptoms.  -hemorrhoidal cream. 7. Acute on Chronic combined systolic/diastolic CHF:   daily  weights. Following i's and o's   -Continue ASA, Demadex, Coreg and Vasotec.   -no added salt to diet 8. Morbid obesity with OHS/OSA:  CPAP qhs. .  9. Acute on chronic respiratory failure: CPAP when asleep.    -Pulmicort and Proventil inhalers for SOB. 2 L oxygen at all times.   -saline spray for nose, humidified o2 10. 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 Continue Lantus 30 units daily with meal coverage and SSI for Elevated BS  -will likely need further titration  LOS (Days) 1 A FACE TO FACE EVALUATION WAS PERFORMED  Colson Barco T 03/25/2013 8:41 AM

## 2013-03-25 NOTE — Progress Notes (Signed)
Inpatient Diabetes Program Recommendations  AACE/ADA: New Consensus Statement on Inpatient Glycemic Control (2013)  Target Ranges:  Prepandial:   less than 140 mg/dL      Peak postprandial:   less than 180 mg/dL (1-2 hours)      Critically ill patients:  140 - 180 mg/dL   Reason for Visit: Hyperglycemia Results for Destiny [name], Destiny [name] (MRN [id number]) as of 03/25/2013 14:31  Ref. Range 03/24/2013 16:14 03/24/2013 18:36 03/24/2013 20:02 03/25/2013 07:21 03/25/2013 11:16  Glucose-Capillary Latest Range: 70-99 mg/dL 409 (H) 811 (H) 914 (H) 195 (H) 300 (H)   Post-prandial hypoglycemia and FBS elevated  Recommendations: Increase meal coverage insulin to Novolog 6 units tidwc  May need slight increase in basal insulin.  Will continue to follow. Thank you. Ailene Ards, RD, LDN, CDE Inpatient Diabetes Coordinator [phone]

## 2013-03-25 NOTE — Evaluation (Signed)
Occupational Therapy Assessment and Plan  Patient Details  Name: Destiny [name] MRN: [id number] Date of Birth: May 05, 1941  OT Diagnosis: muscle weakness (generalized) Rehab Potential: Rehab Potential: Good ELOS: 5-7 days   Today's Date: 03/25/2013 Time: 0930-1030 Time Calculation (min): 60 min  Problem List:  Patient Active Problem List   Diagnosis Date Noted  . Physical deconditioning 03/25/2013  . Epistaxis 03/25/2013  . Rectal bleeding--due to hems/rectal tube irritation 03/25/2013  . DNR (do not resuscitate) 03/24/2013  . Pulmonary edema 03/17/2013  . Hyperkalemia 03/12/2013  . Bronchitis, acute 03/07/2013  . CAP (community acquired pneumonia) 03/06/2013  . Hypothyroidism 03/06/2013  . Acute on chronic combined systolic and diastolic heart failure 03/06/2013  . Acute respiratory failure with hypoxia 03/05/2013  . Olecranon bursitis of right elbow 12/29/2012  . Vitamin D deficiency 06/09/2012  . CKD (chronic kidney disease) stage 3, GFR 30-59 ml/min 12/27/2011  . History of Cardiomyopathy, ischemic   . OSA (obstructive sleep apnea) 09/05/2010  . LEG PAIN, BILATERAL 12/28/2009  . DIABETES MELLITUS, WITH AUTONOMIC NEUROPATHY 10/28/2009  . ATRIAL FIBRILLATION, PAROXYSMAL 06/21/2009  . NON-HODGKIN'S LYMPHOMA 04/21/2009  . MURAL THROMBUS, LEFT VENTRICLE 03/07/2009  . LYMPHEDEMA 03/07/2009  . DIABETIC  RETINOPATHY 08/26/2008  . LBBB 08/26/2008  . CVA 08/26/2008  . BACK PAIN, CHRONIC 08/26/2008  . ICD - IN SITU 08/26/2008  . UTI'S, RECURRENT 06/24/2008  . CAROTID BRUIT 05/20/2007  . DYSLIPIDEMIA 03/19/2007  . HYPERTENSION 11/20/2006    Past Medical History:  Past Medical History  Diagnosis Date  . LBBB (left bundle branch block)   . Carotid bruit     a. 10/19/2011 carotid duplex - Mild hard plaque bilaterally. Stable 40-59% bilateral ICA stenosis.   Marland Kitchen Dyslipidemia   . Hypertension   . Chronic combined systolic and diastolic CHF (congestive heart failure)     a. EF  as low as 25% in 2006;  b. EF 60-65% in 12/2011;  c. 02/2013 Echo: EF 35-40, mod mid-dist antsept HK, Gr 2 DD, mild LVH.  . Back pain, chronic     "just when I walk; mass on 3rd and 4th vertebrae right lower back"  . Cardiomyopathy, ischemic     a. 2012 s/p SJM 3231-40 Uni BiV ICD, ser # [id number].  Marland Kitchen CAD (coronary artery disease)     a. 05/2001 CABG x4: LIMA->LAD, VG->D1, VG->D2, VG->RCA.  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 CKD (chronic kidney disease), stage III   . Biventricular implantable cardiac defibrillator in situ 2007, 2012    a. 2007;  b. 2012 Gen change: SJM 3231-40 Uni BiV ICD, ser # [id number].  Marland Kitchen Hypoxemia requiring supplemental oxygen   . Candidiasis of vulva and vagina   . Hypothyroidism   . 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 Truett Perna, in remission  . History of pneumonia 12/27/11    "3 times; it's been a long time ago"  . OSA on CPAP   . Gout ~ 08/2011  . PAF (paroxysmal atrial fibrillation)     on Coumadin  . DM (diabetes mellitus), type 2 with complications     insulin dependent, retinopathy, neuropathy  . Mural thrombus of left ventricle     before 2003, while on Coumadin, No h/o CVA  . Morbid obesity with BMI of 45.0-49.9, adult     Ht. 5'4". BMI 47.2  . Vitamin D deficiency 06/09/2012    historical  . Olecranon bursitis  of right elbow 12/29/2012  . History of recurrent UTIs    Past Surgical History:  Past Surgical History  Procedure Laterality Date  . Cardiac catheterization  06/03/01  . Cholecystectomy  1970  . Tubal ligation  1972  . Abdominal hysterectomy  1982  . Insert / replace / remove pacemaker  2007; 2012    w/AICD  . Coronary artery bypass graft  2003    CABG X4  . Cataract extraction      left eye    Assessment & Plan Clinical Impression: Patient is a 72 y.o. year old female admitted with few day history of worsening of SOB and cough with hypoxia. Patient with increased O2  needs in the past month per reports. She was treated for acute on chronic CHF with IV diuretics and improved. Was noted to have cellulitis LLE and started on levaquin. Diuretics discontinued due to acute on chronic renal failure. On 03/13/13, patient developed recurrent hypoxia with fever and lethargy requiring intubation for acute respiratory failure. She was started on IV antibiotics for PNA/HCAP and treated with IV diuretics for pulmonary edema. Was started on IV heparin. a She self extubated but required reintubation and was slow to wean off the vent. She tolerated extubation on 03/20/13.   Patient transferred to CIR on 03/24/2013 .    Patient currently requires min to mod A with basic self-care skills and min A for basic mobility secondary to muscle weakness, decreased cardiorespiratoy endurance and decreased oxygen support and decreased standing balance and decreased balance strategies.  Prior to hospitalization, patient could complete ADLs with independent .  Patient will benefit from skilled intervention to decrease level of assist with basic self-care skills and increase independence with basic self-care skills prior to discharge home with care partner.  Anticipate patient will require intermittent supervision and no further OT follow recommended.  OT - End of Session Activity Tolerance: Tolerates 10 - 20 min activity with multiple rests Endurance Deficit: Yes Endurance Deficit Description: requires frequent rest breaks with activity; needs encouragement for pursed lip breathing OT Assessment Rehab Potential: Good OT Patient demonstrates impairments in the following area(s): Balance;Edema;Endurance;Motor;Pain;Safety OT Basic ADL's Functional Problem(s): Grooming;Bathing;Dressing;Toileting OT Advanced ADL's Functional Problem(s): Simple Meal Preparation OT Transfers Functional Problem(s): Toilet;Tub/Shower OT Additional Impairment(s): None OT Plan OT Intensity: Minimum of 1-2 x/day, 45 to  90 minutes OT Frequency: 5 out of 7 days OT Duration/Estimated Length of Stay: 5-7 days OT Treatment/Interventions: Balance/vestibular training;Discharge planning;Community reintegration;DME/adaptive equipment instruction;Functional mobility training;Pain management;Patient/family education;Self Care/advanced ADL retraining;Therapeutic Exercise;UE/LE Coordination activities;Therapeutic Activities;UE/LE Strength taining/ROM;Wheelchair propulsion/positioning OT Basic Self-Care Anticipated Outcome(s): mod I  OT Toileting Anticipated Outcome(s): mod I  OT Bathroom Transfers Anticipated Outcome(s): mod I  OT Recommendation Patient destination: Home Follow Up Recommendations: None Equipment Recommended: None recommended by OT   Skilled Therapeutic Intervention 1:1 OT eval initiated with Pt and pt's husband with OT goals, purpose and role discussed. Self care retraining at shower level with focus on functional ambulation with RW, sit to stands from different surfaces, shower stall transfers with grab bar, d/c planning, activity tolerance with 3-4 liters of O2 (at home pt is on 2 liters), standing balance, etc. Pt required frequent rest breaks with 3/4 dyspnea with activity - O2 stats maintained >90% on 3-4 liters (4 liters in the shower). Pt with LOB with turns and required verbal cuing for walker safety.   OT Evaluation Precautions/Restrictions  Precautions Precautions: Fall Precaution Comments: monitor O2 saturation Restrictions Weight Bearing Restrictions: No General Chart Reviewed: Yes  Family/Caregiver Present: Yes Vital Signs Therapy Vitals BP: 151/75 mmHg Oxygen Therapy SpO2: 96 % O2 Device: Nasal cannula Pain Pain Assessment Pain Assessment: No/denies pain Pain Score: 0-No pain Home Living/Prior Functioning Home Living Available Help at Discharge: Family;Available 24 hours/day Type of Home: House Home Access: Ramped entrance Home Layout: One level  Lives With: Spouse Prior  Function Level of Independence: Independent with transfers;Other (comment) (household ambulation independently; motorized chair for community mobility. Standing activities with frequent seated rest breaks.)  Able to Take Stairs?: No Driving: Yes (Lift to enter/exit Zenaida Niece and car.) Leisure: Hobbies-yes (Comment) Comments: Criossword puzzles ADL  see FIM Vision/Perception  Vision - History Baseline Vision: Wears glasses only for reading Patient Visual Report: No change from baseline Vision - Assessment Vision Assessment: Vision not tested Perception Perception: Within Functional Limits Praxis Praxis: Intact  Cognition Overall Cognitive Status: Within Functional Limits for tasks assessed Arousal/Alertness: Awake/alert Orientation Level: Oriented X4 Safety/Judgment: Appears intact Sensation Sensation Light Touch: Impaired by gross assessment (dimished sensation in bilat hands, feet (stocking/glove distribution) secondary to peripheral neuropathy) Hot/Cold: Appears Intact Proprioception: Appears Intact Motor  Motor Motor: Other (comment) Motor - Skilled Clinical Observations: generalized weakness Mobility  Transfers Transfers: Sit to Stand;Stand to Sit Sit to Stand: 4: Min assist Sit to Stand Details: Manual facilitation for weight shifting Sit to Stand Details (indicate cue type and reason): Manual facilitation of anterior weight shift with sit>stand Stand to Sit: 4: Min assist Stand to Sit Details (indicate cue type and reason): Verbal cues for technique Stand to Sit Details: Verbal cues for hand placement  Trunk/Postural Assessment  Cervical Assessment Cervical Assessment: Within Functional Limits (rounded shoulders ) Thoracic Assessment Thoracic Assessment: Within Functional Limits (kyphotic) Lumbar Assessment Lumbar Assessment: Within Functional Limits (posterior pelvic tilt) Postural Control Postural Control: Deficits on evaluation Righting Reactions: delayed   Balance Static Sitting Balance Static Sitting - Level of Assistance: 5: Stand by assistance Static Standing Balance Static Standing - Level of Assistance: 4: Min assist Dynamic Standing Balance Dynamic Standing - Level of Assistance: 4: Min assist;Other (comment) Extremity/Trunk Assessment RUE Assessment RUE Assessment: Within Functional Limits (generalized weakness) LUE Assessment LUE Assessment: Within Functional Limits (generalized weakness)  FIM:  FIM - Grooming Grooming Steps: Wash, rinse, dry face;Wash, rinse, dry hands;Oral care, brush teeth, clean dentures;Brush, comb hair Grooming: 5: Set-up assist to obtain items FIM - Bathing Bathing Steps Patient Completed: Chest;Right Arm;Abdomen;Left Arm;Right upper leg;Buttocks;Front perineal area;Left upper leg Bathing: 4: Min-Patient completes 8-9 43f 10 parts or 75+ percent FIM - Upper Body Dressing/Undressing Upper body dressing/undressing steps patient completed: Thread/unthread right bra strap;Thread/unthread left bra strap;Thread/unthread right sleeve of pullover shirt/dresss;Thread/unthread left sleeve of pullover shirt/dress;Put head through opening of pull over shirt/dress;Pull shirt over trunk Upper body dressing/undressing: 4: Min-Patient completed 75 plus % of tasks FIM - Lower Body Dressing/Undressing Lower body dressing/undressing steps patient completed: Thread/unthread right underwear leg;Pull underwear up/down;Thread/unthread right pants leg;Pull pants up/down Lower body dressing/undressing: 3: Mod-Patient completed 50-74% of tasks FIM - Toileting Toileting steps completed by patient: Performs perineal hygiene;Adjust clothing after toileting Toileting: 3: Mod-Patient completed 2 of 3 steps FIM - Bed/Chair Transfer Bed/Chair Transfer Assistive Devices: Therapist, occupational: 4: Bed > Chair or W/C: Min A (steadying Pt. > 75%);4: Chair or W/C > Bed: Min A (steadying Pt. > 75%) FIM - Scientist, research (physical sciences) Devices: Art gallery manager Transfers: 4-To toilet/BSC: Min A (steadying Pt. > 75%);4-From toilet/BSC: Min A (steadying Pt. > 75%) FIM - Secretary/administrator Devices: Shower  chair;Grab bars Tub/shower Transfers: 4-Into Tub/Shower: Min A (steadying Pt. > 75%/lift 1 leg);4-Out of Tub/Shower: Min A (steadying Pt. > 75%/lift 1 leg)   Refer to Care Plan for Long Term Goals  Recommendations for other services: None  Discharge Criteria: Patient will be discharged from OT if patient refuses treatment 3 consecutive times without medical reason, if treatment goals not met, if there is a change in medical status, if patient makes no progress towards goals or if patient is discharged from hospital.  The above assessment, treatment plan, treatment alternatives and goals were discussed and mutually agreed upon: by patient and by family  Adan Sis 03/25/2013, 10:48 AM

## 2013-03-25 NOTE — Progress Notes (Signed)
Pt stated she would wear [place] tonight instead of CPAP mask. Pt stated she had some issues with her mask last night. Pt encouraged to call RT if needing any assistance or would like to use a hospital mask or machine. No distress noted at this.

## 2013-03-25 NOTE — Progress Notes (Signed)
Occupational Therapy Session Note  Patient Details  Name: Destiny [name] MRN: [id number] Date of Birth: [birth year]  Today's Date: 03/25/2013 Time: 1191-4782 Time Calculation (min): 40 min  Short Term Goals: Week 1:  OT Short Term Goal 1 (Week 1): STG=LTG-= short LOS  Skilled Therapeutic Interventions/Progress Updates:  Balance/vestibular training;Discharge planning;Community Designer, multimedia;Functional mobility training;Pain management;Patient/family education;Self Care/advanced ADL retraining;Therapeutic Exercise;UE/LE Coordination activities;Therapeutic Activities;UE/LE Strength taining/ROM;Wheelchair propulsion/positioning   1:1 Focus on shower stall transfer with RW with simulated blue frame with steadying A, bed mobility with elevated HOB with pillows on ADL bed, sit to stand with recall of proper hand placement, standing balance, and side stepping with RW to navigate tight spaces. Initiated BERG balance test but unable to complete it due to toileting needs, Returned to room and ambulated into the bathroom, performed 3/3 toileting tasks with supervision to steady A, stood at sink to wash hands with supervision. Pt with 2-3/ dyspnea but maintained O2 stats  Above 90%.   Stood with PT counterpart to complete BERG test.   Therapy Documentation Precautions:  Precautions Precautions: Fall Precaution Comments: monitor O2 saturation Restrictions Weight Bearing Restrictions: No General: General Chart Reviewed: Yes Family/Caregiver Present: Yes Vital Signs: Oxygen Therapy SpO2: 98 % O2 Device: Nasal cannula Pain: C/o right hip pain - Rn made aware    See FIM for current functional status  Therapy/Group: Individual Therapy  Roney Mans [facility] 03/25/2013, 2:28 PM

## 2013-03-26 ENCOUNTER — Inpatient Hospital Stay (HOSPITAL_COMMUNITY): Payer: Medicare Other

## 2013-03-26 ENCOUNTER — Inpatient Hospital Stay (HOSPITAL_COMMUNITY): Payer: Medicare Other | Admitting: Occupational Therapy

## 2013-03-26 ENCOUNTER — Inpatient Hospital Stay (HOSPITAL_COMMUNITY): Payer: Medicare Other | Admitting: Physical Therapy

## 2013-03-26 DIAGNOSIS — I509 Heart failure, unspecified: Secondary | ICD-10-CM | POA: Diagnosis not present

## 2013-03-26 DIAGNOSIS — J9819 Other pulmonary collapse: Secondary | ICD-10-CM | POA: Diagnosis not present

## 2013-03-26 LAB — CBC
Hemoglobin: 10.5 g/dL — ABNORMAL LOW (ref 12.0–15.0)
MCH: 30.3 pg (ref 26.0–34.0)
MCHC: 32.6 g/dL (ref 30.0–36.0)
MCV: 93.1 fL (ref 78.0–100.0)
Platelets: 169 10*3/uL (ref 150–400)
WBC: 8.3 10*3/uL (ref 4.0–10.5)

## 2013-03-26 LAB — GLUCOSE, CAPILLARY: Glucose-Capillary: 183 mg/dL — ABNORMAL HIGH (ref 70–99)

## 2013-03-26 MED ORDER — INSULIN ASPART 100 UNIT/ML ~~LOC~~ SOLN
5.0000 [IU] | Freq: Three times a day (TID) | SUBCUTANEOUS | Status: DC
  Administered 2013-03-26 – 2013-03-31 (×16): 5 [IU] via SUBCUTANEOUS

## 2013-03-26 MED ORDER — FUROSEMIDE 10 MG/ML IJ SOLN
40.0000 mg | Freq: Once | INTRAMUSCULAR | Status: AC
  Administered 2013-03-26: 40 mg via INTRAVENOUS
  Filled 2013-03-26: qty 4

## 2013-03-26 MED ORDER — MUSCLE RUB 10-15 % EX CREA
TOPICAL_CREAM | Freq: Four times a day (QID) | CUTANEOUS | Status: DC
  Administered 2013-03-26 – 2013-03-29 (×9): via TOPICAL
  Filled 2013-03-26 (×2): qty 85

## 2013-03-26 MED ORDER — ALPRAZOLAM 0.5 MG PO TABS
0.5000 mg | ORAL_TABLET | Freq: Every day | ORAL | Status: DC
  Administered 2013-03-26 – 2013-03-31 (×6): 0.5 mg via ORAL
  Filled 2013-03-26 (×6): qty 1

## 2013-03-26 MED ORDER — TRAMADOL HCL 50 MG PO TABS
50.0000 mg | ORAL_TABLET | Freq: Four times a day (QID) | ORAL | Status: DC | PRN
  Administered 2013-03-26 (×2): 50 mg via ORAL
  Filled 2013-03-26 (×3): qty 1

## 2013-03-26 MED ORDER — ENOXAPARIN SODIUM 40 MG/0.4ML ~~LOC~~ SOLN
40.0000 mg | SUBCUTANEOUS | Status: DC
  Administered 2013-03-26 – 2013-03-31 (×6): 40 mg via SUBCUTANEOUS
  Filled 2013-03-26 (×8): qty 0.4

## 2013-03-26 MED ORDER — POTASSIUM CHLORIDE CRYS ER 20 MEQ PO TBCR
20.0000 meq | EXTENDED_RELEASE_TABLET | Freq: Once | ORAL | Status: AC
  Administered 2013-03-26: 20 meq via ORAL
  Filled 2013-03-26: qty 1

## 2013-03-26 MED ORDER — TROLAMINE SALICYLATE 10 % EX CREA
TOPICAL_CREAM | Freq: Four times a day (QID) | CUTANEOUS | Status: DC
  Filled 2013-03-26: qty 85

## 2013-03-26 NOTE — Progress Notes (Signed)
Physical Therapy Session Note  Patient Details  Name: Destiny [name] MRN: [id number] Date of Birth: May 29, 1940  Today's Date: 03/26/2013 Time: 1300-1400 Time Calculation (min): 60 min  Short Term Goals: Week 1:  PT Short Term Goal 1 (Week 1): STG's=LTG's due to short ELOS.  Skilled Therapeutic Interventions/Progress Updates:    Pt received sitting in w/c with spouse present. Pt performed w/c mobility with supervision A and rest as needed. Gait training with RW and min A for 125', 30' and 40' with fatigue and SOB noted, vitals monitored throughout session as indicated below. Deep breathing techniques with visual cues to improve increased O2 SATS with proper performance. Standing balance, and transfer training to improve hand and foot placement and sequencing to improve and to decrease fall risk, reminders required to lock w/c brakes. . Pt very motivated.   Therapy Documentation Precautions:  Precautions Precautions: Fall Precaution Comments: monitor O2 saturation Restrictions Weight Bearing Restrictions: No General:   Vital Signs: Therapy Vitals Pulse Rate: 76 at rest, with w/c mobs 80 and after ambulation 95   Patient Position, if appropriate: Sitting Oxygen Therapy SpO2:at rest 97 %, w/c mobs 98 % and after gait 90 % increased to 98% with deep breathing technique and rest.  O2 Device: Nasal cannula O2 Flow Rate (L/min): 3 L/min Pulse Oximetry Type: Continuous Pain: Pain Assessment Pain Assessment: 0-10 Pain Score:2 Pain Location: Hip Pain Orientation: Right Pain Descriptors / Indicators: Aching Pain Intervention(s): RN made aware                     See FIM for current functional status  Therapy/Group: Individual Therapy  Jackelyn Knife 03/26/2013, 4:35 PM

## 2013-03-26 NOTE — IPOC Note (Signed)
Overall Plan of Care [facility]) Patient Details Name: Destiny [name] MRN: [id number] DOB: 1941-03-04  Admitting Diagnosis: Deconditioned,CHF  Hospital Problems: Principal Problem:   Physical deconditioning Active Problems:   ATRIAL FIBRILLATION, PAROXYSMAL   BACK PAIN, CHRONIC   OSA (obstructive sleep apnea)   History of Cardiomyopathy, ischemic   CKD (chronic kidney disease) stage 3, GFR 30-59 ml/min   Acute on chronic combined systolic and diastolic heart failure   Pulmonary edema   Epistaxis   Rectal bleeding--due to hems/rectal tube irritation     Functional Problem List: Nursing Endurance;Medication Management;Safety;Skin Integrity;Pain  PT Balance;Edema;Endurance;Motor  OT Balance;Edema;Endurance;Motor;Pain;Safety  SLP    TR         Basic ADL's: OT Grooming;Bathing;Dressing;Toileting     Advanced  ADL's: OT Simple Meal Preparation     Transfers: PT Bed Mobility;Bed to Chair;Furniture;Other (comment) (Husband's Zenaida Niece has electric lift for power scooter.)  OT Toilet;Tub/Shower     Locomotion: PT Ambulation;Wheelchair Mobility     Additional Impairments: OT None  SLP        TR      Anticipated Outcomes Item Anticipated Outcome  Self Feeding    Swallowing      Basic self-care  mod I   Toileting  mod I    Bathroom Transfers mod I   Bowel/Bladder  Remain continent of bladder and bowel-no meds  Transfers  mod I  Locomotion  supervision to mod I  Communication     Cognition     Pain  Pain <3  Safety/Judgment  Patient follows safety plan, No fall while on rehab   Therapy Plan: PT Intensity: Minimum of 1-2 x/day ,45 to 90 minutes PT Frequency: 5 out of 7 days PT Duration Estimated Length of Stay: 5-7 days OT Intensity: Minimum of 1-2 x/day, 45 to 90 minutes OT Frequency: 5 out of 7 days OT Duration/Estimated Length of Stay: 5-7 days         Team Interventions: Nursing Interventions Patient/Family Education;Disease Management/Prevention;Pain  Management;Medication Management;Skin Care/Wound Management  PT interventions Ambulation/gait training;Balance/vestibular training;Discharge planning;DME/adaptive equipment instruction;Functional mobility training;Patient/family education;Neuromuscular re-education;Therapeutic Activities;Therapeutic Exercise;UE/LE Strength taining/ROM;Wheelchair propulsion/positioning  OT Interventions Balance/vestibular training;Discharge planning;Community Designer, multimedia;Functional mobility training;Pain management;Patient/family education;Self Care/advanced ADL retraining;Therapeutic Exercise;UE/LE Coordination activities;Therapeutic Activities;UE/LE Strength taining/ROM;Wheelchair propulsion/positioning  SLP Interventions    TR Interventions    SW/CM Interventions Discharge Planning;Psychosocial Support;Patient/Family Education    Team Discharge Planning: Destination: PT-Home ,OT- Home , SLP-  Projected Follow-up: PT-Home health PT;24 hour supervision/assistance, OT-  None, SLP-  Projected Equipment Needs: PT-Rolling walker with 5" wheels;Wheelchair cushion (measurements);Wheelchair (measurements), OT- None recommended by OT, SLP-  Patient/family involved in discharge planning: PT- Patient;Family member/caregiver,  OT-Patient, SLP-   MD ELOS: 7 days Medical Rehab Prognosis:  Excellent Assessment: The patient has been admitted for CIR therapies. The team will be addressing, functional mobility, strength, stamina, balance, safety, adaptive techniques/equipment, self-care, bowel and bladder mgt, patient and caregiver education, cardiovascular education and training. Goals have been set at mod I for most self-care and mobility activities.    Ranelle Oyster, MD, FAAPMR      See Team Conference Notes for weekly updates to the plan of care

## 2013-03-26 NOTE — Progress Notes (Signed)
Patient was feeling restless with complaints of right hip pain, and discomfort. Assisted patient to recliner for comfort. Notified Pam Love,PA given order for Aspercream qid to right hip, and Ultram 50 mg qid prn. Will continue to monitor patient.

## 2013-03-26 NOTE — Progress Notes (Signed)
[facility] Individual Statement of Services  Patient Name:  Destiny [name]  Date:  03/26/2013  Welcome to the [facility]patient [facility].  Our goal is to provide you with an individualized program based on your diagnosis and situation, designed to meet your specific needs.  With this comprehensive rehabilitation program, you will be expected to participate in at least 3 hours of rehabilitation therapies Monday-Friday, with modified therapy programming on the weekends.  Your rehabilitation program will include the following services:  Physical Therapy (PT), Occupational Therapy (OT), 24 hour per day rehabilitation nursing, Therapeutic Recreaction (TR), Case Management (Social Worker), Rehabilitation Medicine, Nutrition Services and Pharmacy Services  Weekly team conferences will be held on Tuesdays to discuss your progress.  Your Social Worker will talk with you frequently to get your input and to update you on team discussions.  Team conferences with you and your family in attendance may also be held.  Expected length of stay: 5-7 days  Overall anticipated outcome: modified independent  Depending on your progress and recovery, your program may change. Your Social Worker will coordinate services and will keep you informed of any changes. Your Social Worker's name and contact numbers are listed  below.  The following services may also be recommended but are not provided by the [facility]patient [facility]:   Driving Evaluations  Home Health Rehabiltiation Services  Outpatient Rehabilitation Services    Arrangements will be made to provide these services after discharge if needed.  Arrangements include referral to agencies that provide these services.  Your insurance has been verified to be:  Medicare and [employer] Your primary doctor is:  Dr. Joaquin Courts  Pertinent information will be shared with your doctor and your insurance company.  Social Worker:   [place], [place] [phone] or [phone]   Information discussed with and copy given to patient by: Amada Jupiter, 03/26/2013, 11:43 AM

## 2013-03-26 NOTE — Progress Notes (Signed)
Pt does not wish to wear her home CPAP tonight. Pt states she is uncomfortable. Pt encouraged to call RT if pt would like to use hospital machine and/or mask. No distress noted at this time.

## 2013-03-26 NOTE — Progress Notes (Signed)
Social Work Assessment and Plan Social Work Assessment and Plan  Patient Details  Name: Destiny [name] MRN: [id number] Date of Birth: 20-Mar-1941  Today's Date: 03/26/2013  Problem List:  Patient Active Problem List   Diagnosis Date Noted  . Physical deconditioning 03/25/2013  . Epistaxis 03/25/2013  . Rectal bleeding--due to hems/rectal tube irritation 03/25/2013  . DNR (do not resuscitate) 03/24/2013  . Pulmonary edema 03/17/2013  . Hyperkalemia 03/12/2013  . Bronchitis, acute 03/07/2013  . CAP (community acquired pneumonia) 03/06/2013  . Hypothyroidism 03/06/2013  . Acute on chronic combined systolic and diastolic heart failure 03/06/2013  . Acute respiratory failure with hypoxia 03/05/2013  . Olecranon bursitis of right elbow 12/29/2012  . Vitamin D deficiency 06/09/2012  . CKD (chronic kidney disease) stage 3, GFR 30-59 ml/min 12/27/2011  . History of Cardiomyopathy, ischemic   . OSA (obstructive sleep apnea) 09/05/2010  . LEG PAIN, BILATERAL 12/28/2009  . DIABETES MELLITUS, WITH AUTONOMIC NEUROPATHY 10/28/2009  . ATRIAL FIBRILLATION, PAROXYSMAL 06/21/2009  . NON-HODGKIN'S LYMPHOMA 04/21/2009  . MURAL THROMBUS, LEFT VENTRICLE 03/07/2009  . LYMPHEDEMA 03/07/2009  . DIABETIC  RETINOPATHY 08/26/2008  . LBBB 08/26/2008  . CVA 08/26/2008  . BACK PAIN, CHRONIC 08/26/2008  . ICD - IN SITU 08/26/2008  . UTI'S, RECURRENT 06/24/2008  . CAROTID BRUIT 05/20/2007  . DYSLIPIDEMIA 03/19/2007  . HYPERTENSION 11/20/2006   Past Medical History:  Past Medical History  Diagnosis Date  . LBBB (left bundle branch block)   . Carotid bruit     a. 10/19/2011 carotid duplex - Mild hard plaque bilaterally. Stable 40-59% bilateral ICA stenosis.   Marland Kitchen Dyslipidemia   . Hypertension   . Chronic combined systolic and diastolic CHF (congestive heart failure)     a. EF as low as 25% in 2006;  b. EF 60-65% in 12/2011;  c. 02/2013 Echo: EF 35-40, mod mid-dist antsept HK, Gr 2 DD, mild LVH.  . Back  pain, chronic     "just when I walk; mass on 3rd and 4th vertebrae right lower back"  . Cardiomyopathy, ischemic     a. 2012 s/p SJM 3231-40 Uni BiV ICD, ser # [id number].  Marland Kitchen CAD (coronary artery disease)     a. 05/2001 CABG x4: LIMA->LAD, VG->D1, VG->D2, VG->RCA.  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 CKD (chronic kidney disease), stage III   . Biventricular implantable cardiac defibrillator in situ 2007, 2012    a. 2007;  b. 2012 Gen change: SJM 3231-40 Uni BiV ICD, ser # [id number].  Marland Kitchen Hypoxemia requiring supplemental oxygen   . Candidiasis of vulva and vagina   . Hypothyroidism   . 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 Truett Perna, in remission  . History of pneumonia 12/27/11    "3 times; it's been a long time ago"  . OSA on CPAP   . Gout ~ 08/2011  . PAF (paroxysmal atrial fibrillation)     on Coumadin  . DM (diabetes mellitus), type 2 with complications     insulin dependent, retinopathy, neuropathy  . Mural thrombus of left ventricle     before 2003, while on Coumadin, No h/o CVA  . Morbid obesity with BMI of 45.0-49.9, adult     Ht. 5'4". BMI 47.2  . Vitamin D deficiency 06/09/2012    historical  . Olecranon bursitis of right elbow 12/29/2012  . History of recurrent UTIs    Past Surgical History:  Past  Surgical History  Procedure Laterality Date  . Cardiac catheterization  06/03/01  . Cholecystectomy  1970  . Tubal ligation  1972  . Abdominal hysterectomy  1982  . Insert / replace / remove pacemaker  2007; 2012    w/AICD  . Coronary artery bypass graft  2003    CABG X4  . Cataract extraction      left eye   Social History:  reports that she has never smoked. She has never used smokeless tobacco. She reports that she does not drink alcohol or use illicit drugs.  Family / Support Systems Marital Status: Married Patient Roles: Spouse;Parent (Lives with husband who is retired and can  assist.) Spouse/Significant Other: husband, Denys Labree @ [phone] or [phone] Children: 3 sons all living locally:  Meda Klinefelter and Richard Anticipated Caregiver: Husband Ability/Limitations of Caregiver: Husband can assist. 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  Laughing easily with husband and this SW.  Denies any emotional distress, however, does state, "no (depression)... but I've had my moments of getting down becuase of the situation..."  Pt denies any s/s of depression or anxiety - will monitor Recent Psychosocial Issues: None Pyschiatric History: None Substance Abuse History: None  Patient / Family Perceptions, Expectations & Goals Pt/Family understanding of illness & functional limitations: pt and husband with good understanding of her health crisis, medical course and current deconditioned state requiring CIR. Premorbid pt/family roles/activities: Pt was limited by her chronic health problems and O2 dependency, however, was still trying to stay as active at community level as possible. Anticipated changes in roles/activities/participation: Little change anticipated as therapies have set mod i goals.  Husband has been providing support PTA and will  continue. Pt/family expectations/goals: "I want to be able to get around on my own."  [employer]: None Premorbid Home Care/DME Agencies: Other (Comment) ([facility] provides O2) Transportation available at discharge: yes  Discharge Planning Living Arrangements: Spouse/significant other Support Systems: Spouse/significant other;Children;Other relatives;Friends/neighbors Type of Residence: Private residence Insurance Resources: Administrator (specify) [employer]) Financial Resources: Social Security Financial Screen Referred: No Living Expenses: Own Money Management: Spouse Does the patient have any problems obtaining your medications?: No Home Management: pt and spouse Patient/Family Preliminary Plans: pt to return home with husband who can provide 24/7 assistance Social Work Anticipated Follow Up Needs: HH/OP Expected length of stay: 5-7 days  Clinical Impression Very pleasant woman here following CHF/ vent dependent hospitalization and severely deconditioned.  Good family support and husband able to provide any assistance needed.  Anticipate short LOS.  Continue to follow.  Daanish Copes 03/26/2013, 11:51 AM

## 2013-03-26 NOTE — Progress Notes (Signed)
Occupational Therapy Session Note  Patient Details  Name: Destiny [name] MRN: [id number] Date of Birth: 05/01/1941  Today's Date: 03/26/2013 Time: 2725-3664 Time Calculation (min): 35 min  Short Term Goals: Week 1:  OT Short Term Goal 1 (Week 1): STG=LTG-= short LOS  Skilled Therapeutic Interventions/Progress Updates:    1:1 Pt missed 10 min due to having a "rough" night last night with anxiety and breathing; pt requested extra time to eat breakfast and wake up. Engaged in self care retraining with focus on functional ambulation with RW to and from the bathroom with VC for hand placements for sit<>stands, toileting with extra time for pericare and dressing sitting EOB. Pt used reacher to thread underwear and pants to increase independence with LB dressing. Pt desated ot 85% on 2 liters with activity, requiring 3 liters with activity.   Therapy Documentation Precautions:  Precautions Precautions: Fall Precaution Comments: monitor O2 saturation Restrictions Weight Bearing Restrictions: No Pain: Pain Assessment Pain Assessment: No/denies pain  See FIM for current functional status  Therapy/Group: Individual Therapy  Roney Mans [facility] 03/26/2013, 11:15 AM

## 2013-03-26 NOTE — Plan of Care (Signed)
Problem: RH PAIN MANAGEMENT Goal: RH STG PAIN MANAGED AT OR BELOW PT'S PAIN GOAL Pain <3  Outcome: Not Progressing Patient experiencing increased right hip pain

## 2013-03-26 NOTE — Progress Notes (Addendum)
Physical Therapy Session Note  Patient Details  Name: Destiny [name] MRN: [id number] Date of Birth: 07-07-1940  Today's Date: 03/26/2013 Time: 0930-1030  Time Calculation (min):  Short Term Goals: Week 1:  PT Short Term Goal 1 (Week 1): STG's=LTG's due to short ELOS.  Skilled Therapeutic Interventions/Progress Updates:  1:1. Pt received sitting in recliner, ready for therapy. Pt req supervision for amb to bathroom and toilet transfer, (I) for pericare. Focus this session on functional endurance and assessment of dynamic standing balance. Berg Balance Test performed pt scored 25/56, pt and husband educated on results and that pt is at a high risk for falls without use of AD. Both verbalized understanding. Pt w/ intermittent posterior LOB throughout test, req assistance to maintain balance or slowly lower to tx mat. Functional endurance targeted through w/c propulsion- 50' w/ visual and verbal cues for seq of turns, amb- 100', 40' and 15'x2 w/ RW and close (S) to min guard as well as w/ use of NuStep. Pt cued for pursed lip breathing during amb, noted SOB. Pt w/ good tolerance to NuStep, level 1x66min. Pt able to maintain SaO2 >90% on 3-4L throughout tx session, req 4L after bout of amb x100' to bring SaO2 >90%, able to decrease back down to 3L during final on NuStep while maintaining >90%. Pt remained on 3L at end of session. Pt sitting in recliner at end of session w/ all needs in reach and husband in room.    Therapy Documentation Precautions:  Precautions Precautions: Fall Precaution Comments: monitor O2 saturation Restrictions Weight Bearing Restrictions: No  Vital Signs: Therapy Vitals Pulse Rate: 108 Oxygen Therapy SpO2: 87 % (after amb x100', increased to 4L to >90%) O2 Device: Nasal cannula O2 Flow Rate (L/min): 3 L/min Pulse Oximetry Type: Continuous Pain: Pain Assessment Pain Assessment: No/denies pain    Balance: Standardized Balance Assessment Standardized  Balance Assessment: Berg Balance Test Berg Balance Test Sit to Stand: Able to stand  independently using hands Standing Unsupported: Able to stand 2 minutes with supervision Sitting with Back Unsupported but Feet Supported on Floor or Stool: Able to sit safely and securely 2 minutes Stand to Sit: Controls descent by using hands Transfers: Able to transfer with verbal cueing and /or supervision Standing Unsupported with Eyes Closed: Able to stand 10 seconds with supervision Standing Ubsupported with Feet Together: Able to place feet together independently and stand for 1 minute with supervision From Standing, Reach Forward with Outstretched Arm: Can reach forward >12 cm safely (5") From Standing Position, Pick up Object from Floor: Unable to try/needs assist to keep balance From Standing Position, Turn to Look Behind Over each Shoulder: Needs assist to keep from losing balance and falling Turn 360 Degrees: Needs assistance while turning Standing Unsupported, Alternately Place Feet on Step/Stool: Needs assistance to keep from falling or unable to try Standing Unsupported, One Foot in Front: Needs help to step but can hold 15 seconds Standing on One Leg: Unable to try or needs assist to prevent fall Total Score: 25  See FIM for current functional status  Therapy/Group: Individual Therapy  Denzil Hughes 03/26/2013, 1:56 PM

## 2013-03-26 NOTE — Progress Notes (Signed)
Subjective/Complaints: SOB last night. sats apparently remained stable. Able to sleep later in the evening and early in the morning. Husband notes some anxiety.  A 12 point review of systems has been performed and if not noted above is otherwise negative.   Objective: Vital Signs: Blood pressure 140/60, pulse 82, temperature 98.4 F (36.9 C), temperature source Oral, resp. rate 18, height 5\' 4"  (1.626 m), weight 121.156 kg (267 lb 1.6 oz), SpO2 94.00%. No results found.  Recent Labs  03/25/13 0650 03/26/13 0520  WBC 9.4 8.3  HGB 10.9* 10.5*  HCT 34.0* 32.2*  PLT 207 169    Recent Labs  03/24/13 0747 03/25/13 0650  NA 142 140  K 4.2 4.6  CL 103 100  GLUCOSE 158* 208*  BUN 45* 39*  CREATININE 1.09 1.02  CALCIUM 9.2 9.3   CBG (last 3)   Recent Labs  03/25/13 1646 03/25/13 2057 03/26/13 0732  GLUCAP 135* 259* 183*    Wt Readings from Last 3 Encounters:  03/25/13 121.156 kg (267 lb 1.6 oz)  03/20/13 122.9 kg (270 lb 15.1 oz)  01/15/13 123.741 kg (272 lb 12.8 oz)    Physical Exam:  Constitutional: She is oriented to person, place, and time. She appears well-developed and well-nourished.  Obese. Appears comfortable HENT:  Head: Normocephalic and atraumatic.  Right Ear: External ear normal.  Left Ear: External ear normal.  Eyes: Conjunctivae and EOM are normal. Pupils are equal, round, and reactive to light.  Neck: No JVD present. No tracheal deviation present. No thyromegaly present.  Cardiovascular: Normal rate and intact distal pulses. Reg rhythm Respiratory: Effort normal and breath sounds normal. No respiratory distress. She has no wheezes. Oxygen via [place] [facility]: She exhibits no distension. There is no tenderness.  Musculoskeletal: She exhibits edema (trace to 1+ edema in legs).  Lymphadenopathy:  She has no cervical adenopathy.  Neurological: She is alert and oriented to person, place, and time. She has normal reflexes. No cranial nerve deficit.  Coordination normal.  Pt appropriate. Good insight and awareness. Sensation intact in all 4s except for mild decrease in distal LE's/feet. UE strength 4-4+/5. LE 3/5 HF to 4/5 at ankles. CN non-focal.  Psychiatric: She has a normal mood and affect. Her behavior is normal. Judgment and thought content normal.     Assessment/Plan: 1. Functional deficits secondary to deconditioning after acute on chronic respiratory failure which require 3+ hours per day of interdisciplinary therapy in a comprehensive inpatient rehab setting. Physiatrist is providing close team supervision and 24 hour management of active medical problems listed below. Physiatrist and rehab team continue to assess barriers to discharge/monitor patient progress toward functional and medical goals. FIM: FIM - Bathing Bathing Steps Patient Completed: Chest;Right Arm;Abdomen;Left Arm;Right upper leg;Buttocks;Front perineal area;Left upper leg Bathing: 4: Min-Patient completes 8-9 37f 10 parts or 75+ percent  FIM - Upper Body Dressing/Undressing Upper body dressing/undressing steps patient completed: Thread/unthread right bra strap;Thread/unthread left bra strap;Thread/unthread right sleeve of pullover shirt/dresss;Thread/unthread left sleeve of pullover shirt/dress;Put head through opening of pull over shirt/dress;Pull shirt over trunk Upper body dressing/undressing: 4: Min-Patient completed 75 plus % of tasks FIM - Lower Body Dressing/Undressing Lower body dressing/undressing steps patient completed: Thread/unthread right underwear leg;Pull underwear up/down;Thread/unthread right pants leg;Pull pants up/down Lower body dressing/undressing: 3: Mod-Patient completed 50-74% of tasks  FIM - Toileting Toileting steps completed by patient: Adjust clothing prior to toileting;Performs perineal hygiene;Adjust clothing after toileting Toileting: 4: Steadying assist  FIM - Diplomatic Services operational officer Devices: Art gallery manager  Transfers: 4-To toilet/BSC: Min A (steadying Pt. > 75%);4-From toilet/BSC: Min A (steadying Pt. > 75%)  FIM - Bed/Chair Transfer Bed/Chair Transfer Assistive Devices: Therapist, occupational: 4: Bed > Chair or W/C: Min A (steadying Pt. > 75%);4: Chair or W/C > Bed: Min A (steadying Pt. > 75%);5: Supine > Sit: Supervision (verbal cues/safety issues);5: Sit > Supine: Supervision (verbal cues/safety issues)  FIM - Locomotion: Wheelchair Distance: 75 Locomotion: Wheelchair: 2: Travels 50 - 149 ft with supervision, cueing or coaxing FIM - Locomotion: Ambulation Locomotion: Ambulation Assistive Devices: Designer, industrial/product Ambulation/Gait Assistance: 4: Min assist  Comprehension Comprehension Mode: Auditory Comprehension: 5-Follows basic conversation/direction: With no assist  Expression Expression Mode: Verbal Expression: 5-Expresses basic needs/ideas: With extra time/assistive device  Social Interaction Social Interaction: 6-Interacts appropriately with others with medication or extra time (anti-anxiety, antidepressant).  Problem Solving Problem Solving: 6-Solves complex problems: With extra time  Memory Memory: 6-More than reasonable amt of time  Medical Problem List and Plan:  Deconditioning due to acute on chronic respiratory failure due to fluid overload in setting of OHS/OSA.  1. DVT Prophylaxis/Anticoagulation: Pharmaceutical: Sq lovenox  2. Chronic back pain/ Pain Management: continue Neurontin bid with tylenol prn.  3. Mood: Will monitor for now. LCSW to follow for evaluation.  4. Neuropsych: This patient is capable of making decisions on her own behalf.   -add scheduled xanax at night 5. Leucocytosis: reactive and slowly resolving. Will monitor for fevers and other signs of infection.  6 Rectal bleeding due to [place]/irritation from rectal tube: follow hgb and symptoms.  -hemorrhoidal cream. 7. Acute on Chronic combined systolic/diastolic CHF:   daily weights. Following i's  and o's--fairly balanced   -Continue ASA, Demadex, Coreg and Vasotec.   -no added salt to diet  -pt maintaining sats when up with therapy. Stamina improving  - I suspect some of night time symptoms are anxiety related 8. Morbid obesity with OHS/OSA:  CPAP qhs. .  9. Acute on chronic respiratory failure: CPAP when asleep.    -Pulmicort and Proventil inhalers for SOB. 2 L oxygen at all times.   -saline spray for nose, humidified o2  -flonase 10. 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 Continue Lantus 30 units daily with meal coverage and SSI for Elevated BS  -increase mealtime covg today  LOS (Days) 2 A FACE TO FACE EVALUATION WAS PERFORMED  Maegen Wigle T 03/26/2013 9:24 AM

## 2013-03-26 NOTE — Progress Notes (Signed)
Physical Therapy Session Note  Patient Details  Name: MARIONETTE MESKILL MRN: [id number] Date of Birth: 03-09-1941  Today's Date: 03/26/2013 Time: 9629-5284 Time Calculation (min): 25 min  Short Term Goals: Week 1:  PT Short Term Goal 1 (Week 1): STG's=LTG's due to short ELOS.  Skilled Therapeutic Interventions/Progress Updates:  1:1. Pt received sitting in recliner, stated feeling fatigued from PT session directly prior to this one but motivated to participate. Pt able to amb 36' w/ RW and close (S) prior to requesting seated rest break 2/2 fatigue and audible SOB. SaO2 87% on 3L after amb, increased to 4L to bring SaO2>90%. Pt transported via w/c for remainder of session 2/2 fatigue. Focus in therapy gym on t/f sit<>stand x4 w/out RW and dynamic standing balance during 2 games of horseshoes. Emphasis on reaching outside BOS to R side to obtain horseshoe and anterior weight shift during toss w/out use of RW. Pt req close (S) to min guard assist w/ no LOB. Pt able to maintain SaO2 >95% during game on 3L. Pt sitting in recliner at end of session w/ all needs in reach and husband in room.   Therapy Documentation Precautions:  Precautions Precautions: Fall Precaution Comments: monitor O2 saturation Restrictions Weight Bearing Restrictions: No Vital Signs: Oxygen Therapy SpO2: 87 % (increased to 4L to bring SaO2>90%) O2 Device: Nasal cannula O2 Flow Rate (L/min): 3 L/min Pulse Oximetry Type: Continuous Pain: Pain Assessment Pain Assessment: 0-10 Pain Score: 5  Pain Location: Hip Pain Orientation: Right Pain Descriptors / Indicators: Aching Pain Intervention(s): RN made aware  See FIM for current functional status  Therapy/Group: Individual Therapy  Denzil Hughes 03/26/2013, 2:34 PM

## 2013-03-27 ENCOUNTER — Ambulatory Visit: Payer: Medicare Other | Admitting: Family Medicine

## 2013-03-27 ENCOUNTER — Inpatient Hospital Stay (HOSPITAL_COMMUNITY): Payer: Medicare Other | Admitting: Physical Therapy

## 2013-03-27 ENCOUNTER — Inpatient Hospital Stay (HOSPITAL_COMMUNITY): Payer: Medicare Other

## 2013-03-27 ENCOUNTER — Encounter (HOSPITAL_COMMUNITY): Payer: Medicare Other | Admitting: Occupational Therapy

## 2013-03-27 DIAGNOSIS — J9600 Acute respiratory failure, unspecified whether with hypoxia or hypercapnia: Secondary | ICD-10-CM

## 2013-03-27 DIAGNOSIS — I5043 Acute on chronic combined systolic (congestive) and diastolic (congestive) heart failure: Secondary | ICD-10-CM

## 2013-03-27 DIAGNOSIS — R040 Epistaxis: Secondary | ICD-10-CM

## 2013-03-27 DIAGNOSIS — I4891 Unspecified atrial fibrillation: Secondary | ICD-10-CM

## 2013-03-27 DIAGNOSIS — I2589 Other forms of chronic ischemic heart disease: Secondary | ICD-10-CM

## 2013-03-27 LAB — BASIC METABOLIC PANEL
BUN: 31 mg/dL — ABNORMAL HIGH (ref 6–23)
Calcium: 8.9 mg/dL (ref 8.4–10.5)
Chloride: 100 mEq/L (ref 96–112)
Creatinine, Ser: 1.1 mg/dL (ref 0.50–1.10)
GFR calc Af Amer: 58 mL/min — ABNORMAL LOW (ref >90)
GFR calc non Af Amer: 50 mL/min — ABNORMAL LOW (ref >90)
Glucose, Bld: 174 mg/dL — ABNORMAL HIGH (ref 70–99)

## 2013-03-27 LAB — CBC
HCT: 32.6 % — ABNORMAL LOW (ref 36.0–46.0)
Hemoglobin: 10.2 g/dL — ABNORMAL LOW (ref 12.0–15.0)
MCHC: 31.3 g/dL (ref 30.0–36.0)
MCV: 94.8 fL (ref 78.0–100.0)
Platelets: 170 10*3/uL (ref 150–400)
RDW: 15.6 % — ABNORMAL HIGH (ref 11.5–15.5)
WBC: 7.2 10*3/uL (ref 4.0–10.5)

## 2013-03-27 LAB — GLUCOSE, CAPILLARY
Glucose-Capillary: 189 mg/dL — ABNORMAL HIGH (ref 70–99)
Glucose-Capillary: 218 mg/dL — ABNORMAL HIGH (ref 70–99)

## 2013-03-27 MED ORDER — FUROSEMIDE 10 MG/ML IJ SOLN
40.0000 mg | Freq: Every day | INTRAMUSCULAR | Status: AC
  Administered 2013-03-27 – 2013-03-28 (×2): 40 mg via INTRAVENOUS
  Filled 2013-03-27 (×2): qty 4

## 2013-03-27 MED ORDER — INSULIN GLARGINE 100 UNIT/ML ~~LOC~~ SOLN
5.0000 [IU] | SUBCUTANEOUS | Status: AC
  Administered 2013-03-27: 5 [IU] via SUBCUTANEOUS
  Filled 2013-03-27: qty 0.05

## 2013-03-27 MED ORDER — INSULIN GLARGINE 100 UNIT/ML ~~LOC~~ SOLN
35.0000 [IU] | Freq: Every day | SUBCUTANEOUS | Status: DC
  Administered 2013-03-28 – 2013-03-29 (×2): 35 [IU] via SUBCUTANEOUS
  Filled 2013-03-27 (×2): qty 0.35

## 2013-03-27 NOTE — Progress Notes (Signed)
Physical Therapy Session Note  Patient Details  Name: Destiny [name] MRN: [id number] Date of Birth: 18-Apr-1941  Today's Date: 03/27/2013 Time:   5284-1324 session one              1100-1200 session two Time Calculation (min): 30 min session one                                             60 min session two  Short Term Goals: Week 1:  PT Short Term Goal 1 (Week 1): STG's=LTG's due to short ELOS.  Skilled Therapeutic Interventions/Progress Updates:    Pt seen in two session today:  Session one:   Pt received sitting in w/c with spouse present. Pt performed gait training with RW with supervision to min A due to need for portable O2 and w/c behind 100' x 2 with 2 standing rests on 2nd trial due to SOB and fatigue. Wheelchair mobility with supervision A and toilet transfers with supervision A with pt able to perform personal hygiene and clothing management. SATs monitored throughout with pt on 3 L maintaining above 92 %. Pt returned to room in w/c with spouse present and needs within reach.   Session two:   Pt received sitting in w/c. Pt performed wheelchair mobility to rehab gym O2 SATs stayed above 93% and HR 68. Nu-step at L 1 x 3 mins maintaining MET level of 1.8-2.0 pt reported BORG of 9 SpO2 95% HR 75bpm, 3 more mins with MET level of 2.0-2.2 Borg of 11 SpO2 of 93% HR 74 continued for additional 4 mins and reported Borg of 11 SpO2 96% and HR 77bpm ( total 10 mins on Nu-step with 3 rests) pt displayed increased activity tolerance and was able to converse throughout aerobic activity without noticeable SOB. Pt performed static/dynamic standing balance activities with reaching and bending outside BOS, pt required 3 seated rests and continued to maintain SATs above 90% with 3L of portable O2. Gait x 75' with RW, pt returned to room to recliner with all needs within reach.    Therapy Documentation Precautions:  Precautions Precautions: Fall Precaution Comments: monitor O2  saturation Restrictions Weight Bearing Restrictions: No    Vital Signs: Oxygen Therapy SpO2: 96 % O2 Device: Nasal cannula O2 Flow Rate (L/min): 3 L/min HR: 75bpm Pain: no pain/ denies pain both sessions      Locomotion : Ambulation Ambulation/Gait Assistance: 4: Min assist               See FIM for current functional status  Therapy/Group: Individual Therapy for both sessions  Jackelyn Knife 03/27/2013, 12:15 PM

## 2013-03-27 NOTE — Progress Notes (Signed)
Patient noted in floor at 1655 laying on side against wall facing bed. Patient reports she was reaching for call bell in bed mattress and stood up beside chair and fell hitting head on wall. Vital signs stable . See doc flowsheets . Neuro assessment W.N.L. Dr. Posey Rea notified approximately 1715. No new injuries noted. Previous bruising to extremities persist. No bumps or open areas noted to head . Patient's t husband notfifed by patient . Quick release belt applied while patient up in recliner. Continue with plan of care . Patient verbalized understanding of safety protocol.  Destiny [name]

## 2013-03-27 NOTE — Progress Notes (Signed)
Physical Therapy Session Note  Patient Details  Name: Destiny [name] MRN: [id number] Date of Birth: 10/24/1940  Today's Date: 03/27/2013 Time: 1330-1415 Time Calculation (min): 45 min  Short Term Goals: Week 1:  PT Short Term Goal 1 (Week 1): STG's=LTG's due to short ELOS.  Skilled Therapeutic Interventions/Progress Updates:  1:1. Pt received using bathroom w/ nurse tech in room, therapist stepped in to initiate tx session. Pt req supervision for toilet t/f , but mod(I) for pericare. Focus this session on sit<>stand transfers w/ RW, functional endurance and strengthening. Pt performed multiple t/f sit<>stand w/ RW throughout session w/ emphasis on hand placement for safety and consistency. Functional endurance targeted through w/c propulsion and ambulation. Pt able to amb 85', 75' and 50'x2 w/ RW, supervision and seated or standing rest breaks between bouts. During standing mobility, pt verbalized that R hip pain was more limiting than cardiovascular endurance. RN aware of pain. Transition to w/c propulsion for emphasis on cardiovascular endurance. Pt able to propel w/c 50' w/ B UE then 23' w/ B UE/LE. Close(S) to occasional min A during propulsion for steering and obstacle negotiation. Pt performed alternated seating and standing therex to target B LE strength. Seated exercises included 2x10 reps of: toe raises, heel raises, marching and LAQ. Standing exercises included 2x10 reps of: mini squats, toe raises and heel raises. Pt educated on benefit of ankle pumps and B LE elevation for management of edema. Pt able to maintain SaO2 >90% on 3L throughout tx session. Pt sitting in recliner at end of session w/ all needs in reach.   Therapy Documentation Precautions:  Precautions Precautions: Fall Precaution Comments: monitor O2 saturation Restrictions Weight Bearing Restrictions: No   Vital Signs: Oxygen Therapy O2 Device: Nasal cannula O2 Flow Rate (L/min): 3 L/min Pain: Pain  Assessment Pain Assessment: Faces Faces Pain Scale: Hurts little more Pain Location: Hip Pain Orientation: Right Pain Descriptors / Indicators: Aching Pain Intervention(s): RN made aware  See FIM for current functional status  Therapy/Group: Individual Therapy  Denzil Hughes 03/27/2013, 2:18 PM

## 2013-03-27 NOTE — Progress Notes (Addendum)
Subjective/Complaints: Had a much better night. Slept most of the night outside of having to empty bladder.  A 12 point review of systems has been performed and if not noted above is otherwise negative.   Objective: Vital Signs: Blood pressure 138/59, pulse 70, temperature 97 F (36.1 C), temperature source Oral, resp. rate 22, height 5\' 4"  (1.626 m), weight 118.298 kg (260 lb 12.8 oz), SpO2 98.00%. Dg Chest 1 View  03/26/2013   CLINICAL DATA:  Cough and shortness of breath. Hypertension. Diabetic. Recently intubated.  EXAM: CHEST - 1 VIEW  COMPARISON:  03/21/2013  FINDINGS: Mildly degraded exam due to AP portable technique and patient body habitus. Pacer/AICD device, unchanged.  Remote right rib trauma. Cardiomegaly accentuated by AP portable technique. No definite pleural fluid. No pneumothorax. Demented lung volumes with increased interstitial opacities. No lobar consolidation.  IMPRESSION: Worsened aeration with increased moderate congestive heart failure.  Decreased sensitivity and specificity exam due to technique related factors, as described above.   Electronically Signed   By: Jeronimo Greaves M.D.   On: 03/26/2013 16:27   Dg Chest 2 View  03/27/2013   CLINICAL DATA:  CHF.  Cough.  Shortness of breath.  EXAM: CHEST  2 VIEW  COMPARISON:  03/26/2013.  FINDINGS: Mediastinum is normal. Persistent cardiomegaly and pulmonary vascular prominence with bilateral interstitial and alveolar infiltrates are again noted. Interstitial/alveolar edema has improved from prior exam. There is however progressive atelectasis in both lung bases. Prior CABG. Cardiac pacer in stable position. Old right rib fractures. No pneumothorax.  IMPRESSION: 1. Interim improvement of congestive heart failure and pulmonary edema. 2. Progressive bibasilar atelectasis. Underlying pneumonia cannot be excluded.   Electronically Signed   By: Maisie Fus  Register   On: 03/27/2013 07:50    Recent Labs  03/26/13 0520 03/27/13 0530   WBC 8.3 7.2  HGB 10.5* 10.2*  HCT 32.2* 32.6*  PLT 169 170    Recent Labs  03/25/13 0650 03/27/13 0530  NA 140 142  K 4.6 4.4  CL 100 100  GLUCOSE 208* 174*  BUN 39* 31*  CREATININE 1.02 1.10  CALCIUM 9.3 8.9   CBG (last 3)   Recent Labs  03/26/13 1654 03/26/13 2033 03/27/13 0713  GLUCAP 175* 217* 162*    Wt Readings from Last 3 Encounters:  03/27/13 118.298 kg (260 lb 12.8 oz)  03/20/13 122.9 kg (270 lb 15.1 oz)  01/15/13 123.741 kg (272 lb 12.8 oz)    Physical Exam:  Constitutional: She is oriented to person, place, and time. She appears well-developed and well-nourished.  Obese. Appears comfortable HENT:  Head: Normocephalic and atraumatic.  Right Ear: External ear normal.  Left Ear: External ear normal.  Eyes: Conjunctivae and EOM are normal. Pupils are equal, round, and reactive to light.  Neck: No JVD present. No tracheal deviation present. No thyromegaly present.  Cardiovascular: Normal rate and intact distal pulses. Reg rhythm Respiratory: Effort normal and breath sounds normal. No respiratory distress. She has a few wheezes and rhonchi today. Oxygen via [place] GI: She exhibits no distension. There is no tenderness.  Musculoskeletal: She exhibits edema (trace to 1+ edema in legs).  Lymphadenopathy:  She has no cervical adenopathy.  Neurological: She is alert and oriented to person, place, and time. She has normal reflexes. No cranial nerve deficit. Coordination normal.  Pt appropriate. Good insight and awareness. Sensation intact in all 4s except for mild decrease in distal LE's/feet. UE strength 4-4+/5. LE 3/5 HF to 4/5 at ankles. CN non-focal.  Psychiatric: She has a normal mood and affect. Her behavior is normal. Judgment and thought content normal.     Assessment/Plan: 1. Functional deficits secondary to deconditioning after acute on chronic respiratory failure which require 3+ hours per day of interdisciplinary therapy in a comprehensive inpatient  rehab setting. Physiatrist is providing close team supervision and 24 hour management of active medical problems listed below. Physiatrist and rehab team continue to assess barriers to discharge/monitor patient progress toward functional and medical goals. FIM: FIM - Bathing Bathing Steps Patient Completed: Chest;Right Arm;Abdomen;Left Arm;Right upper leg;Buttocks;Front perineal area;Left upper leg Bathing: 4: Min-Patient completes 8-9 53f 10 parts or 75+ percent  FIM - Upper Body Dressing/Undressing Upper body dressing/undressing steps patient completed: Thread/unthread right bra strap;Thread/unthread left bra strap;Thread/unthread right sleeve of pullover shirt/dresss;Thread/unthread left sleeve of pullover shirt/dress;Put head through opening of pull over shirt/dress;Pull shirt over trunk Upper body dressing/undressing: 4: Min-Patient completed 75 plus % of tasks FIM - Lower Body Dressing/Undressing Lower body dressing/undressing steps patient completed: Thread/unthread right underwear leg;Pull underwear up/down;Thread/unthread right pants leg;Pull pants up/down Lower body dressing/undressing: 3: Mod-Patient completed 50-74% of tasks  FIM - Toileting Toileting steps completed by patient: Adjust clothing prior to toileting;Performs perineal hygiene;Adjust clothing after toileting Toileting Assistive Devices: Grab bar or rail for support Toileting: 5: Supervision: Safety issues/verbal cues  FIM - Diplomatic Services operational officer Devices: Art gallery manager Transfers: 4-To toilet/BSC: Min A (steadying Pt. > 75%);4-From toilet/BSC: Min A (steadying Pt. > 75%)  FIM - Bed/Chair Transfer Bed/Chair Transfer Assistive Devices: Therapist, occupational: 5: Supine > Sit: Supervision (verbal cues/safety issues);5: Sit > Supine: Supervision (verbal cues/safety issues);4: Bed > Chair or W/C: Min A (steadying Pt. > 75%);4: Chair or W/C > Bed: Min A (steadying Pt. > 75%)  FIM - Locomotion:  Wheelchair Distance: 75 Locomotion: Wheelchair: 2: Travels 50 - 149 ft with supervision, cueing or coaxing FIM - Locomotion: Ambulation Locomotion: Ambulation Assistive Devices: Designer, industrial/product Ambulation/Gait Assistance: 4: Min assist Locomotion: Ambulation: 1: Travels less than 50 ft with minimal assistance (Pt.>75%)  Comprehension Comprehension Mode: Auditory Comprehension: 5-Follows basic conversation/direction: With no assist  Expression Expression Mode: Verbal Expression: 5-Expresses basic needs/ideas: With extra time/assistive device  Social Interaction Social Interaction: 6-Interacts appropriately with others with medication or extra time (anti-anxiety, antidepressant).  Problem Solving Problem Solving: 5-Solves basic problems: With no assist  Memory Memory: 6-More than reasonable amt of time  Medical Problem List and Plan:  Deconditioning due to acute on chronic respiratory failure due to fluid overload in setting of OHS/OSA.  1. DVT Prophylaxis/Anticoagulation: Pharmaceutical: Sq lovenox  2. Chronic back pain/ Pain Management: continue Neurontin bid with tylenol prn.  3. Mood: Will monitor for now. LCSW to follow for evaluation.  4. Neuropsych: This patient is capable of making decisions on her own behalf.   -added scheduled xanax at night which helped 5. Leucocytosis: reactive and slowly resolving. Will monitor for fevers and other signs of infection.  6 Rectal bleeding due to [place]/irritation from rectal tube: follow hgb and symptoms.  -hemorrhoidal cream. 7. Acute on Chronic combined systolic/diastolic CHF:   daily weights. Following i's and o's--fairly balanced   -Continue ASA, Demadex, Coreg and Vasotec.  -IV lasix given yesterday after CXR reviewed. She had a much better night last night   -continue no added salt to diet  -pt maintaining sats when up with therapy. Stamina improving  -CXR looks better this am but there is some atelectasis. ---encourage  IS  -Will ask Cardiology for follow  up regarding her diuretics and volume status 8. Morbid obesity with OHS/OSA:  CPAP qhs. .  9. Acute on chronic respiratory failure: CPAP when asleep.    -Pulmicort and Proventil inhalers for SOB. 2 L oxygen at all times.   -saline spray for nose, humidified o2  -flonase  -has a few rhonchi and wheezes on exam today 10. 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 Continue Lantus 30 units daily with meal coverage and SSI for Elevated BS  -increased mealtime covg yesterday.   -will increase lantus slightly today  LOS (Days) 3 A FACE TO FACE EVALUATION WAS PERFORMED  Gared Gillie T 03/27/2013 8:50 AM

## 2013-03-27 NOTE — Progress Notes (Signed)
Occupational Therapy Session Note  Patient Details  Name: Destiny [name] MRN: [id number] Date of Birth: 16-Mar-1941  Today's Date: 03/27/2013 Time: 0815-0900 Time Calculation (min): 45 min  Short Term Goals: Week 1:  OT Short Term Goal 1 (Week 1): STG=LTG-= short LOS  Skilled Therapeutic Interventions/Progress Updates:    1:1 self care retraining at shower level with focus on functional ambulation around room and bathroom with RW with close supervision and A to manage O2 tubing. Pt still presents with dyspnea but can continue with activity with pursed lip breathing. Pt bathed at supervision level sit to stand with long handled sponge for LB. Also pt able to complete threading underwear and pants, socks and shoes with AE- need A to fasten shoes. Pt and OT discussed goal for next session to work on obtaining clothing from [employer] and management of O2 tubing.  Therapy Documentation Precautions:  Precautions Precautions: Fall Precaution Comments: monitor O2 saturation Restrictions Weight Bearing Restrictions: No General:   Vital Signs: Oxygen Therapy SpO2: 98 % O2 Device: Nasal cannula O2 Flow Rate (L/min): 3 L/min Pain: No c/o pain in session  See FIM for current functional status  Therapy/Group: Individual Therapy  Roney Mans [facility] 03/27/2013, 10:15 AM

## 2013-03-27 NOTE — Progress Notes (Addendum)
SUBJECTIVE: The patient is doing well today.  At this time, she denies chest pain, shortness of breath, or any new concerns.  She states her breathing is much improved after dose of IV Lasix yesterday.  CURRENT MEDICATIONS: . albuterol  2.5 mg Nebulization QID  . ALPRAZolam  0.5 mg Oral QHS  . amLODipine  5 mg Oral Daily  . budesonide (PULMICORT) nebulizer solution  0.25 mg Nebulization BID  . carvedilol  6.25 mg Oral BID WC  . enalapril  10 mg Oral Daily  . enoxaparin (LOVENOX) injection  40 mg Subcutaneous Q24H  . feeding supplement (GLUCERNA SHAKE)  237 mL Oral Q supper  . fluticasone  2 spray Each Nare Daily  . gabapentin  200 mg Oral BID  . hydrocortisone   Rectal TID  . insulin aspart  0-15 Units Subcutaneous TID WC  . insulin aspart  0-5 Units Subcutaneous QHS  . insulin aspart  5 Units Subcutaneous TID WC  . [START ON 03/28/2013] insulin glargine  35 Units Subcutaneous Daily  . insulin glargine  5 Units Subcutaneous NOW  . levothyroxine  25 mcg Oral QAC breakfast  . MUSCLE RUB   Topical QID  . sodium chloride  2 spray Each Nare QID  . torsemide  20 mg Oral BID      OBJECTIVE: Physical Exam: Filed Vitals:   03/26/13 2053 03/26/13 2054 03/27/13 0457 03/27/13 0747  BP:  152/52 138/59   Pulse:   70   Temp:   97 F (36.1 C)   TempSrc:   Oral   Resp:   22   Height:      Weight:   260 lb 12.8 oz (118.298 kg)   SpO2: 98%  95% 98%    Intake/Output Summary (Last 24 hours) at 03/27/13 1045 Last data filed at 03/27/13 0900  Gross per 24 hour  Intake   1077 ml  Output    200 ml  Net    877 ml    GEN- The patient is overweight and chronically ill appearing, alert and oriented x 3 today.   Head- normocephalic, atraumatic Eyes-  Sclera clear, conjunctiva pink Ears- hearing intact Oropharynx- clear Neck- supple, JPV 10cm Lungs- decreased BS at the bases, normal work of breathing Heart- Regular rate and rhythm (paced)  GI- soft, NT, ND, + BS Extremities- no  clubbing, cyanosis, + dependant edema Skin- no rash or lesion Psych- euthymic mood, full affect Neuro- strength and sensation are intact  LABS: Basic Metabolic Panel:  Recent Labs  56/21/30 0650 03/27/13 0530  NA 140 142  K 4.6 4.4  CL 100 100  CO2 32 33*  GLUCOSE 208* 174*  BUN 39* 31*  CREATININE 1.02 1.10  CALCIUM 9.3 8.9   Liver Function Tests:  Recent Labs  03/25/13 0650  AST 40*  ALT 43*  ALKPHOS 65  BILITOT 0.5  PROT 6.6  ALBUMIN 2.9*  CBC:  Recent Labs  03/25/13 0650 03/26/13 0520 03/27/13 0530  WBC 9.4 8.3 7.2  NEUTROABS 6.4  --   --   HGB 10.9* 10.5* 10.2*  HCT 34.0* 32.2* 32.6*  MCV 93.7 93.1 94.8  PLT 207 169 170    RADIOLOGY: Dg Chest 1 View 03/26/2013   CLINICAL DATA:  Cough and shortness of breath. Hypertension. Diabetic. Recently intubated.  EXAM: CHEST - 1 VIEW  COMPARISON:  03/21/2013  FINDINGS: Mildly degraded exam due to AP portable technique and patient body habitus. Pacer/AICD device, unchanged.  Remote right rib trauma.  Cardiomegaly accentuated by AP portable technique. No definite pleural fluid. No pneumothorax. Demented lung volumes with increased interstitial opacities. No lobar consolidation.  IMPRESSION: Worsened aeration with increased moderate congestive heart failure.  Decreased sensitivity and specificity exam due to technique related factors, as described above.   Electronically Signed   By: Jeronimo Greaves M.D.   On: 03/26/2013 16:27   Dg Chest 2 View 03/27/2013   CLINICAL DATA:  CHF.  Cough.  Shortness of breath.  EXAM: CHEST  2 VIEW  COMPARISON:  03/26/2013.  FINDINGS: Mediastinum is normal. Persistent cardiomegaly and pulmonary vascular prominence with bilateral interstitial and alveolar infiltrates are again noted. Interstitial/alveolar edema has improved from prior exam. There is however progressive atelectasis in both lung bases. Prior CABG. Cardiac pacer in stable position. Old right rib fractures. No pneumothorax.  IMPRESSION:  1. Interim improvement of congestive heart failure and pulmonary edema. 2. Progressive bibasilar atelectasis. Underlying pneumonia cannot be excluded.   Electronically Signed   By: Maisie Fus  Register   On: 03/27/2013 07:50   ASSESSMENT AND PLAN:  Principal Problem:   Physical deconditioning Active Problems:   ATRIAL FIBRILLATION, PAROXYSMAL   BACK PAIN, CHRONIC   OSA (obstructive sleep apnea)   History of Cardiomyopathy, ischemic   CKD (chronic kidney disease) stage 3, GFR 30-59 ml/min   Acute on chronic combined systolic and diastolic heart failure   Pulmonary edema   Epistaxis   Rectal bleeding--due to hems/rectal tube irritation  1. Acute on chronic combined systolic and diastolic dysfunction with acute respiratory failure and hypoxia Improved with IV lasix yestereday Will give IV lasix again today and tomorrow Continue current torsemide dosing May benefit from additional of spironolactone or increasing enalapril.  Will reassess in 48 hours after diuresis  2. afib Rate controlled Restart coumadin if no further nose bleeding in 24 hours  3. Ischemic cardiomyopathy No ischemic symptoms As above   Hillis Range, MD 03/27/2013 10:45 AM

## 2013-03-28 ENCOUNTER — Inpatient Hospital Stay (HOSPITAL_COMMUNITY): Payer: Medicare Other | Admitting: Occupational Therapy

## 2013-03-28 ENCOUNTER — Inpatient Hospital Stay (HOSPITAL_COMMUNITY): Payer: Medicare Other | Admitting: Physical Therapy

## 2013-03-28 DIAGNOSIS — E1149 Type 2 diabetes mellitus with other diabetic neurological complication: Secondary | ICD-10-CM

## 2013-03-28 DIAGNOSIS — R5381 Other malaise: Secondary | ICD-10-CM

## 2013-03-28 DIAGNOSIS — G4733 Obstructive sleep apnea (adult) (pediatric): Secondary | ICD-10-CM

## 2013-03-28 LAB — BASIC METABOLIC PANEL
Calcium: 8.8 mg/dL (ref 8.4–10.5)
Chloride: 97 mEq/L (ref 96–112)
GFR calc non Af Amer: 44 mL/min — ABNORMAL LOW (ref >90)
Glucose, Bld: 214 mg/dL — ABNORMAL HIGH (ref 70–99)
Potassium: 4.4 mEq/L (ref 3.5–5.1)
Sodium: 137 mEq/L (ref 135–145)

## 2013-03-28 LAB — GLUCOSE, CAPILLARY
Glucose-Capillary: 193 mg/dL — ABNORMAL HIGH (ref 70–99)
Glucose-Capillary: 271 mg/dL — ABNORMAL HIGH (ref 70–99)

## 2013-03-28 MED ORDER — ALBUTEROL SULFATE (5 MG/ML) 0.5% IN NEBU: 2.5000 mg | INHALATION_SOLUTION | RESPIRATORY_TRACT | Status: DC | PRN

## 2013-03-28 NOTE — Progress Notes (Signed)
Occupational Therapy Session Note  Patient Details  Name: Destiny [name] MRN: [id number] Date of Birth: 03/23/1941  Today's Date: 03/28/2013 Time: 6578-4696 Time Calculation (min): 43 min  Skilled Therapeutic Interventions/Progress Updates: Patient completed endurance, functional mobility, and energy conservation activities RW level  With many rest breaks as she was SOB with all activities.    Therapy Documentation Precautions:  Precautions Precautions: Fall Precaution Comments: monitor O2 saturation Restrictions Weight Bearing Restrictions: No Pain:denied   See FIM for current functional status  Therapy/Group: Individual Therapy  Bud Face [facility] 03/28/2013, 3:19 PM

## 2013-03-28 NOTE — Progress Notes (Signed)
[name], female   DOB: [birth year], 72 y.o.   MRN: [id number]   Subjective/Complaints:  11/15.  72 y/o  Admit  with deconditioning after treatment for acute on chronic  RF. Continues to struggle with sleep but finally got some rest after trazadone.  Sleeps in recliner and using home CPAP; c/o buttock pain Medical problems include DM, HTN, chronic RF with chronic home O2    Objective: Vital Signs: Blood pressure 126/60, pulse 74, temperature 98.3 F (36.8 C), temperature source Oral, resp. rate 18, height 5\' 4"  (1.626 m), weight 260 lb 9.6 oz (118.207 kg), SpO2 96.00%. Dg Chest 1 View  03/26/2013   CLINICAL DATA:  Cough and shortness of breath. Hypertension. Diabetic. Recently intubated.  EXAM: CHEST - 1 VIEW  COMPARISON:  03/21/2013  FINDINGS: Mildly degraded exam due to AP portable technique and patient body habitus. Pacer/AICD device, unchanged.  Remote right rib trauma. Cardiomegaly accentuated by AP portable technique. No definite pleural fluid. No pneumothorax. Demented lung volumes with increased interstitial opacities. No lobar consolidation.  IMPRESSION: Worsened aeration with increased moderate congestive heart failure.  Decreased sensitivity and specificity exam due to technique related factors, as described above.   Electronically Signed   By: Jeronimo Greaves M.D.   On: 03/26/2013 16:27   Dg Chest 2 View  03/27/2013   CLINICAL DATA:  CHF.  Cough.  Shortness of breath.  EXAM: CHEST  2 VIEW  COMPARISON:  03/26/2013.  FINDINGS: Mediastinum is normal. Persistent cardiomegaly and pulmonary vascular prominence with bilateral interstitial and alveolar infiltrates are again noted. Interstitial/alveolar edema has improved from prior exam. There is however progressive atelectasis in both lung bases. Prior CABG. Cardiac pacer in stable position. Old right rib fractures. No pneumothorax.  IMPRESSION: 1. Interim improvement of congestive heart failure and pulmonary edema. 2. Progressive  bibasilar atelectasis. Underlying pneumonia cannot be excluded.   Electronically Signed   By: Maisie Fus  Register   On: 03/27/2013 07:50    Recent Labs  03/26/13 0520 03/27/13 0530  WBC 8.3 7.2  HGB 10.5* 10.2*  HCT 32.2* 32.6*  PLT 169 170    Recent Labs  03/27/13 0530 03/28/13 0540  NA 142 137  K 4.4 4.4  CL 100 97  GLUCOSE 174* 214*  BUN 31* 33*  CREATININE 1.10 1.21*  CALCIUM 8.9 8.8   CBG (last 3)   Recent Labs  03/27/13 1617 03/27/13 2129 03/28/13 0731  GLUCAP 126* 218* 193*    Wt Readings from Last 3 Encounters:  03/28/13 260 lb 9.6 oz (118.207 kg)  03/20/13 270 lb 15.1 oz (122.9 kg)  01/15/13 272 lb 12.8 oz (123.741 kg)   Patient Vitals for the past 24 hrs:  BP Temp Temp src Pulse Resp SpO2 Weight  03/28/13 0721 - - - - - 96 % -  03/28/13 0500 126/60 mmHg 98.3 F (36.8 C) Oral 74 18 94 % 260 lb 9.6 oz (118.207 kg)  03/28/13 0215 126/49 mmHg 97.4 F (36.3 C) Oral 73 18 96 % -  03/27/13 2313 143/90 mmHg 98.1 F (36.7 C) Oral 77 18 97 % -  03/27/13 2225 148/47 mmHg 98.1 F (36.7 C) Oral 72 - 97 % -  03/27/13 2105 151/65 mmHg 98.3 F (36.8 C) Oral 76 18 98 % -  03/27/13 2014 - - - - - 98 % -  03/27/13 2004 162/48 mmHg 97.3 F (36.3 C) Oral 81 20 97 % -  03/27/13 1845 148/78 mmHg - - 80 18 97 % -  03/27/13 1815 150/64 mmHg 97.8 F (36.6 C) - 79 18 98 % -  03/27/13 1745 130/78 mmHg - - 88 18 - -  03/27/13 1730 130/80 mmHg - - 78 20 96 % -  03/27/13 1715 138/78 mmHg - - 82 20 97 % -  03/27/13 1655 138/70 mmHg 98.2 F (36.8 C) Oral 80 20 96 % -  03/27/13 1455 149/72 mmHg 97.9 F (36.6 C) Oral 78 - 100 % -    Intake/Output Summary (Last 24 hours) at 03/28/13 0955 Last data filed at 03/28/13 0600  Gross per 24 hour  Intake   1437 ml  Output    850 ml  Net    587 ml    Physical Exam:  Constitutional: She is oriented to person, place, and time. She appears well-developed and well-nourished.  Obese. Appears comfortable; n/c O2 in place HENT:   Head: Normocephalic and atraumatic.   Eyes: Conjunctivae and EOM are normal. Pupils are equal, round, and reactive to light.  Neck: No JVD present. No tracheal deviation present. No thyromegaly present.  Cardiovascular: Normal rate and intact distal pulses. Reg rhythm; s/p sternotomy and PM Respiratory: Effort normal and breath sounds normal. No respiratory distress. She has a rare wheezes and rhonchi today. Oxygen via [place] GI: She exhibits no distension. There is no tenderness.  Musculoskeletal: She exhibits edema (trace to 1+ edema in legs).  Lymphadenopathy:  She has no cervical adenopathy.  Neurological: She is alert and oriented to person, place, and time. She has normal reflexes. No cranial nerve deficit. Coordination normal.     Assessment/Plan: 1. Functional deficits secondary to deconditioning after acute on chronic respiratory failure which require 3+ hours per day of interdisciplinary therapy in a comprehensive inpatient rehab setting. 2. Morbid obesity with OHS/OSA:  CPAP qhs. .  3. Acute on chronic respiratory failure: CPAP when asleep.    -Pulmicort and Proventil inhalers for SOB. 2 L oxygen at all times.   -saline spray for nose, humidified o2  -flonase 4.  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 Continue Lantus 30 units daily with meal coverage and SSI for Elevated BS   LOS (Days) 4 A FACE TO FACE EVALUATION WAS PERFORMED  Rogelia Boga 03/28/2013 9:49 AM

## 2013-03-28 NOTE — Progress Notes (Signed)
Physical Therapy Session Note  Patient Details  Name: Destiny [name] MRN: [id number] Date of Birth: 03-24-1941  Today's Date: 03/28/2013 Time:0900-0955 and  1445-1530 Time Calculation (min):  55 min and 45 min  Short Term Goals: Week 1:  PT Short Term Goal 1 (Week 1): STG's=LTG's due to short ELOS.  Skilled Therapeutic Interventions/Progress Updates:    Treatment Session 1:  Pt received seated in bedside chair. Agreeable to therapy; reports pain in bilateral buttocks. Nursing alerted and w/c cushion modified to address said pain. Throughout session on 3L/min of supplemental O2, [place] with SpO2 maintained >92%. With pt seated EOB, assisted in dressing, during which pt performed multiple sit<>stands from EOB to rolling with supervision; dynamic standing balance with min guard. Self-propulsion of w/c via bilat upper/lower extremities x100' with supervision for obstacle negotiation with 2 seated rest breaks (<30 seconds) required, per pt request, due to SOB. Pt managed all w/c parts, with exception of leg rests, with supervision. Therapist provided hand-over-hand assist for removal of w/c brakes; pt gave effective return demonstration for remainder of session but required increased time. Once in gym, pt negotiated ramp and single standard curb x1 trial. Ascended/descended curb and ascended ramp with supervision; descended ramp with min A to control rolling walker. Pt transported to room in w/c due to pt fatigue. Session ended with pt seated in bedside chair with quick release belt on and all needs within reach. Supplemental O2 at 3L/min, [place]; SpO2 96%.  Treatment Session 2:  Pt received seated in bedside chair. Agreeable to therapy; report no pain. Upon arrival of this PT, pt on 3L/min supplemental O2, [place], with SpO2 96%, HR 78. Session focused on increasing pt stability/independence with gait, functional transfers, and dynamic standing balance. Gait x110' total with rolling walker, min guard; standing rest  break (<30 seconds) after ambulating x70'. Post-gait SpO2 93%, HR 104 (2L/min, [place]).  See below for detailed description of neuromuscular re-education. Following dynamic standing balance, transported pt back to room in w/c (secondary to pt fatigue), where pt performed toilet transfer, sit>stand from bedside chair without rolling walker requiring close supervision. Session ended with pt seated in bedside chair with quick release belt on and all needs within reach. Supplemental O2 at 2.5L/min, ; SpO2 98%.   Therapy Documentation  Precautions:  Precautions Precautions: Fall Precaution Comments: monitor O2 saturation Restrictions Weight Bearing Restrictions: No Vital Signs: Therapy Vitals Temp: 98.1 F (36.7 C) Temp src: Oral Pulse Rate: 83 Resp: 20 BP: 177/73 mmHg Patient Position, if appropriate: Sitting Oxygen Therapy SpO2: 96 % O2 Device: None (Room air) Pain: Pain Assessment Pain Score: 0-No pain Locomotion : Ambulation Ambulation/Gait Assistance: 4: Min guard  Balance: Short bouts (1-2 minutes each) of dynamic standing balance x6 trials total; 2 trials with single UE support at rolling walker requiring supervision, 4 trials without UE support requiring min guard. Posterior LOB with effective self-recovery via hip strategy, UE support. SpO2 increased to 3L/min during standing balance activities, after which SpO2 was >94% for remainder of session. During dynamic standing balance activities, pt required verbal cueing to inhale through nose. Pt gave effective return demonstration.  See FIM for current functional status  Therapy/Group: Individual Therapy  [place]ble, Lorenda Ishihara 03/28/2013, 3:46 PM

## 2013-03-28 NOTE — Progress Notes (Signed)
Patient and husband insists on patient sleeping on recliner at night. During rounds, patient has slid all the way down the bottom of the reclined chair with the quick release belt intact. RN, NT, and husband all had to help slide her up in the recliner to prevent her from further sliding down and potentially falling. RN had encouraged patient to sleep in the bed instead to prevent a potential fall if she slides down while sleeping in the recliner. Patient and husband are adamant in her sleeping in the recliner despite RN recommendation. Patient repositioned in the recliner, quick release belt placed. Husband sleeping in bed.

## 2013-03-28 NOTE — Progress Notes (Signed)
Occupational Therapy Session Note  Patient Details  Name: Destiny [name] MRN: [id number] Date of Birth: 14-Feb-1941  Today's Date: 03/28/2013 Time: 1914-7829 Time Calculation (min): 60 min  Skilled Therapeutic Interventions/Progress Updates: Patient seen for in room shower with focus on endurance, standing balance and standing endurance,functional mobility and safety with O2 cord, and energy conservation.   ADL overall mod A for LB tasks as her husbanded helped her today as they both preferred it that way rather than using reacher, sock aide or shoe horn.  Patient SOB with all movements and activities and required numerous rest breaks in order to complete the ADL.  She laid back onto the bed to rest after this ADL.  Husband present and stated he would alert staff to activate bed alarm when he got ready to leave.     Therapy Documentation Precautions:  Precautions Precautions: Fall Precaution Comments: monitor O2 saturation Restrictions Weight Bearing Restrictions: No   Pain:denied    See FIM for current functional status  Therapy/Group: Individual Therapy  [facility] 03/28/2013, 3:12 PM

## 2013-03-28 NOTE — Progress Notes (Signed)
Patient has her home CPAP unit with her here to use as she pleases. Patient can go on and off the machine at her pleasure. Patient has also been informed to call RT if she wishes to try one of our units or masks if her machine becomes uncomfortable. No distress noted at this time. RT will continue to assist as needed.

## 2013-03-29 ENCOUNTER — Inpatient Hospital Stay (HOSPITAL_COMMUNITY): Payer: Medicare Other | Admitting: Physical Therapy

## 2013-03-29 LAB — GLUCOSE, CAPILLARY
Glucose-Capillary: 269 mg/dL — ABNORMAL HIGH (ref 70–99)
Glucose-Capillary: 261 mg/dL — ABNORMAL HIGH (ref 70–99)

## 2013-03-29 MED ORDER — INSULIN GLARGINE 100 UNIT/ML ~~LOC~~ SOLN
40.0000 [IU] | Freq: Every day | SUBCUTANEOUS | Status: DC
  Administered 2013-03-30 – 2013-03-31 (×2): 40 [IU] via SUBCUTANEOUS
  Filled 2013-03-29 (×3): qty 0.4

## 2013-03-29 NOTE — Progress Notes (Signed)
[name], female   DOB: 1940-12-30, 72 y.o.   MRN: [id number] [name], female   DOB: 1940-08-01, 72 y.o.   MRN: [id number]   Subjective/Complaints:  11/16.  72 y/o  Admit  with deconditioning after treatment for acute on chronic  RF. Continues to struggle with sleep but slept in bed last night for several hours; did not use CPAP- N/C O2 only.  Better rested this AM. Medical problems include DM, HTN, chronic RF with chronic home O2    Objective: Vital Signs: Blood pressure 150/57, pulse 83, temperature 97.9 F (36.6 C), temperature source Oral, resp. rate 20, height 5\' 4"  (1.626 m), weight 263 lb 14.3 oz (119.7 kg), SpO2 98.00%. No results found.  Recent Labs  03/27/13 0530  WBC 7.2  HGB 10.2*  HCT 32.6*  PLT 170    Recent Labs  03/27/13 0530 03/28/13 0540  NA 142 137  K 4.4 4.4  CL 100 97  GLUCOSE 174* 214*  BUN 31* 33*  CREATININE 1.10 1.21*  CALCIUM 8.9 8.8   CBG (last 3)   Recent Labs  03/28/13 1658 03/28/13 2043 03/29/13 0735  GLUCAP 198* 296* 269*    Wt Readings from Last 3 Encounters:  03/29/13 263 lb 14.3 oz (119.7 kg)  03/20/13 270 lb 15.1 oz (122.9 kg)  01/15/13 272 lb 12.8 oz (123.741 kg)   Patient Vitals for the past 24 hrs:  BP Temp Temp src Pulse Resp SpO2 Weight  03/29/13 0625 150/57 mmHg 97.9 F (36.6 C) Oral 83 20 98 % 263 lb 14.3 oz (119.7 kg)  03/28/13 2221 127/67 mmHg - - - - - -  03/28/13 1922 - - - - - 96 % -  03/28/13 1830 145/68 mmHg 98.2 F (36.8 C) - 82 20 96 % -  03/28/13 1500 177/73 mmHg 98.1 F (36.7 C) Oral 83 20 96 % -  03/28/13 1016 163/70 mmHg 98.2 F (36.8 C) - 83 20 96 % -  03/28/13 1000 134/51 mmHg 97.3 F (36.3 C) Oral 74 - 100 % -    Intake/Output Summary (Last 24 hours) at 03/29/13 0858 Last data filed at 03/29/13 0455  Gross per 24 hour  Intake    480 ml  Output   1925 ml  Net  -1445 ml    Physical Exam:  Constitutional: She is oriented to person, place, and time. She  appears well-developed and well-nourished.  Obese. Appears comfortable; n/c O2 in place HENT:  Head: Normocephalic and atraumatic.   Eyes: Conjunctivae and EOM are normal. Pupils are equal, round, and reactive to light.  Neck: No JVD present. No tracheal deviation present. No thyromegaly present.  Cardiovascular: Normal rate and intact distal pulses. Reg rhythm; s/p sternotomy and PM Respiratory: Effort normal and breath sounds normal. No respiratory distress. She has decreased BS but clear today. Oxygen via [place] GI: She exhibits no distension. There is no tenderness.  Musculoskeletal: She exhibits edema (trace to 1+ edema in legs).  Lymphadenopathy:  She has no cervical adenopathy.  Neurological: She is alert and oriented to person, place, and time. She has normal reflexes. No cranial nerve deficit. Coordination normal.     Assessment/Plan: 1. Functional deficits secondary to deconditioning after acute on chronic respiratory failure which require 3+ hours per day of interdisciplinary therapy in a comprehensive inpatient rehab setting. 2. Morbid obesity with OHS/OSA:  CPAP qhs. .  3. Acute on chronic respiratory failure: CPAP when asleep.    -  Pulmicort and Proventil inhalers for SOB. 2 L oxygen at all times.   -saline spray for nose, humidified o2  -flonase 4.  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 On Lantus 30 units daily with meal coverage and SSI for Elevated BS;  CBG remains elevated- will titrate Lantus and continue SSI   LOS (Days) 5 A FACE TO FACE EVALUATION WAS PERFORMED  Rogelia Boga 03/29/2013 8:58 AM

## 2013-03-29 NOTE — Progress Notes (Signed)
The patient has a home CPAP unit that she uses at her pleasure. Patient is aware to call the RT if she needs any assistance with the machine. RT will continue to assist as needed.

## 2013-03-29 NOTE — Progress Notes (Signed)
Physical Therapy Note  Patient Details  Name: Destiny [name] MRN: [id number] Date of Birth: 06/21/1940 Today's Date: 03/29/2013  1400-1455 (55 minutes) individual Pain: no reported pain Focus of treatment:  Therapeutic exercise focused on activity tolerance; gait training/endurance Treatment: Pt in recliner upon arrival; Oxygen sats > 92% on 3 L [place] resting; transfer SPT RW SBA ; Nustep Level 4 X 10 minutes (Oxygen sats > 92% @ 3 minutes); gait - 65  Feet, 85 feet RW SBA.    Trammell Bowden,JIM 03/29/2013, 2:16 PM

## 2013-03-30 ENCOUNTER — Inpatient Hospital Stay (HOSPITAL_COMMUNITY): Payer: Medicare Other | Admitting: Physical Therapy

## 2013-03-30 ENCOUNTER — Inpatient Hospital Stay (HOSPITAL_COMMUNITY): Payer: Medicare Other

## 2013-03-30 DIAGNOSIS — D62 Acute posthemorrhagic anemia: Secondary | ICD-10-CM

## 2013-03-30 DIAGNOSIS — IMO0001 Reserved for inherently not codable concepts without codable children: Secondary | ICD-10-CM

## 2013-03-30 DIAGNOSIS — E1165 Type 2 diabetes mellitus with hyperglycemia: Secondary | ICD-10-CM

## 2013-03-30 DIAGNOSIS — R5381 Other malaise: Secondary | ICD-10-CM

## 2013-03-30 DIAGNOSIS — E669 Obesity, unspecified: Secondary | ICD-10-CM

## 2013-03-30 DIAGNOSIS — I509 Heart failure, unspecified: Secondary | ICD-10-CM

## 2013-03-30 LAB — GLUCOSE, CAPILLARY
Glucose-Capillary: 174 mg/dL — ABNORMAL HIGH (ref 70–99)
Glucose-Capillary: 215 mg/dL — ABNORMAL HIGH (ref 70–99)

## 2013-03-30 LAB — CBC
MCH: 30.4 pg (ref 26.0–34.0)
MCV: 91.9 fL (ref 78.0–100.0)
Platelets: 164 10*3/uL (ref 150–400)
RDW: 15.6 % — ABNORMAL HIGH (ref 11.5–15.5)
WBC: 5 10*3/uL (ref 4.0–10.5)

## 2013-03-30 LAB — BASIC METABOLIC PANEL
BUN: 35 mg/dL — ABNORMAL HIGH (ref 6–23)
CO2: 33 mEq/L — ABNORMAL HIGH (ref 19–32)
Calcium: 8.3 mg/dL — ABNORMAL LOW (ref 8.4–10.5)
Creatinine, Ser: 1.17 mg/dL — ABNORMAL HIGH (ref 0.50–1.10)
Glucose, Bld: 256 mg/dL — ABNORMAL HIGH (ref 70–99)

## 2013-03-30 MED ORDER — WARFARIN - PHARMACIST DOSING INPATIENT: Freq: Every day | Status: DC

## 2013-03-30 MED ORDER — ENALAPRIL MALEATE 20 MG PO TABS
20.0000 mg | ORAL_TABLET | Freq: Every day | ORAL | Status: DC
  Administered 2013-03-30 – 2013-03-31 (×2): 20 mg via ORAL
  Filled 2013-03-30 (×3): qty 1

## 2013-03-30 MED ORDER — WARFARIN SODIUM 7.5 MG PO TABS
7.5000 mg | ORAL_TABLET | Freq: Once | ORAL | Status: AC
  Administered 2013-03-30: 7.5 mg via ORAL
  Filled 2013-03-30: qty 1

## 2013-03-30 MED ORDER — WARFARIN SODIUM 5 MG PO TABS: 5.0000 mg | ORAL_TABLET | Freq: Every day | ORAL | Status: DC

## 2013-03-30 NOTE — Progress Notes (Signed)
Physical Therapy Note  Patient Details  Name: JEANEEN CALA MRN: [id number] Date of Birth: 08-12-1940 Today's Date: 03/30/2013  1000-1030 (30 minutes) individual Pain: no reported pain  Focus of treatment: therapeutic exercise focused on activity tolerance; gait training/ endurance Treatment: wc mobility 50 feet SBA ; transfer stand/turn RW SBA ; Nustep Level 5 x 10 minutes (Oxygen sats > 92% on 3 L [place]); gait - 20 feet, 25 feet min assist for balance without AD  / increased forward trunk lean; distance limited by 4/4 dyspnea; gait with RW 72 feet SBA; returned to room with nurse tech assisting with bathroom needs.   Athanasia Stanwood,JIM 03/30/2013, 10:15 AM

## 2013-03-30 NOTE — Progress Notes (Signed)
Subjective/Complaints: Slept well. Used cpap last night.   A 12 point review of systems has been performed and if not noted above is otherwise negative.   Objective: Vital Signs: Blood pressure 118/55, pulse 75, temperature 98 F (36.7 C), temperature source Oral, resp. rate 20, height 5\' 4"  (1.626 m), weight 119.7 kg (263 lb 14.3 oz), SpO2 95.00%. No results found.  Recent Labs  03/30/13 0500  WBC 5.0  HGB 9.8*  HCT 29.6*  PLT 164    Recent Labs  03/28/13 0540 03/30/13 0500  NA 137 137  K 4.4 4.1  CL 97 97  GLUCOSE 214* 256*  BUN 33* 35*  CREATININE 1.21* 1.17*  CALCIUM 8.8 8.3*   CBG (last 3)   Recent Labs  03/29/13 1640 03/29/13 2116 03/30/13 0733  GLUCAP 261* 232* 271*    Wt Readings from Last 3 Encounters:  03/29/13 119.7 kg (263 lb 14.3 oz)  03/20/13 122.9 kg (270 lb 15.1 oz)  01/15/13 123.741 kg (272 lb 12.8 oz)    Physical Exam:  Constitutional: She is oriented to person, place, and time. She appears well-developed and well-nourished.  Obese. Appears comfortable HENT:  Head: Normocephalic and atraumatic.  Right Ear: External ear normal.  Left Ear: External ear normal.  Eyes: Conjunctivae and EOM are normal. Pupils are equal, round, and reactive to light.  Neck: No JVD present. No tracheal deviation present. No thyromegaly present.  Cardiovascular: Normal rate and intact distal pulses. Reg rhythm Respiratory: Effort normal and breath sounds normal. No respiratory distress. She has a few wheezes and rhonchi today. Oxygen via [place] GI: She exhibits no distension. There is no tenderness.  Musculoskeletal: She exhibits edema (trace to 1+ edema in legs).  Lymphadenopathy:  She has no cervical adenopathy.  Neurological: She is alert and oriented to person, place, and time. She has normal reflexes. No cranial nerve deficit. Coordination normal.  Pt appropriate. Good insight and awareness. Sensation intact in all 4s except for mild decrease in distal  LE's/feet. UE strength 4-4+/5. LE 3/5 HF to 4/5 at ankles. CN non-focal.  Psychiatric: She has a normal mood and affect. Her behavior is normal. Judgment and thought content normal.     Assessment/Plan: 1. Functional deficits secondary to deconditioning after acute on chronic respiratory failure which require 3+ hours per day of interdisciplinary therapy in a comprehensive inpatient rehab setting. Physiatrist is providing close team supervision and 24 hour management of active medical problems listed below. Physiatrist and rehab team continue to assess barriers to discharge/monitor patient progress toward functional and medical goals. FIM: FIM - Bathing Bathing Steps Patient Completed: Chest;Right Arm;Left Arm;Abdomen;Front perineal area;Buttocks;Right lower leg (including foot);Left lower leg (including foot);Right upper leg;Left upper leg (used AE ) Bathing: 5: Set-up assist to: Adjust water temp  FIM - Upper Body Dressing/Undressing Upper body dressing/undressing steps patient completed: Thread/unthread left bra strap;Hook/unhook bra;Thread/unthread right bra strap;Thread/unthread right sleeve of pullover shirt/dresss;Thread/unthread left sleeve of pullover shirt/dress;Put head through opening of pull over shirt/dress;Pull shirt over trunk Upper body dressing/undressing: 5: Set-up assist to: Obtain clothing/put away FIM - Lower Body Dressing/Undressing Lower body dressing/undressing steps patient completed: Thread/unthread left underwear leg;Thread/unthread right underwear leg;Pull underwear up/down;Thread/unthread right pants leg;Thread/unthread left pants leg;Pull pants up/down;Don/Doff right sock;Don/Doff left sock;Don/Doff right shoe;Don/Doff left shoe Lower body dressing/undressing: 4: Min-Patient completed 75 plus % of tasks  FIM - Toileting Toileting steps completed by patient: Adjust clothing prior to toileting;Performs perineal hygiene;Adjust clothing after toileting Toileting  Assistive Devices: Grab bar or rail for  support Toileting: 6: More than reasonable amount of time  FIM - Diplomatic Services operational officer Devices: Grab bars;Walker Toilet Transfers: 5-To toilet/BSC: Supervision (verbal cues/safety issues);5-From toilet/BSC: Supervision (verbal cues/safety issues)  FIM - Bed/Chair Transfer Bed/Chair Transfer Assistive Devices: Therapist, occupational: 5: Chair or W/C > Bed: Supervision (verbal cues/safety issues);5: Bed > Chair or W/C: Supervision (verbal cues/safety issues)  FIM - Locomotion: Wheelchair Distance: 100 Locomotion: Wheelchair: 2: Travels 50 - 149 ft with supervision, cueing or coaxing FIM - Locomotion: Ambulation Locomotion: Ambulation Assistive Devices: Designer, industrial/product Ambulation/Gait Assistance: 4: Min guard Locomotion: Ambulation: 2: Travels 50 - 149 ft with supervision/safety issues  Comprehension Comprehension Mode: Auditory Comprehension: 5-Understands basic 90% of the time/requires cueing < 10% of the time  Expression Expression Mode: Verbal Expression: 5-Expresses basic 90% of the time/requires cueing < 10% of the time.  Social Interaction Social Interaction: 6-Interacts appropriately with others with medication or extra time (anti-anxiety, antidepressant).  Problem Solving Problem Solving: 5-Solves basic 90% of the time/requires cueing < 10% of the time  Memory Memory: 6-More than reasonable amt of time  Medical Problem List and Plan:  Deconditioning due to acute on chronic respiratory failure due to fluid overload in setting of OHS/OSA.  1. DVT Prophylaxis/Anticoagulation: Pharmaceutical: Sq lovenox  2. Chronic back pain/ Pain Management: continue Neurontin bid with tylenol prn.  3. Mood: generally appropriate.  4. Neuropsych: This patient is capable of making decisions on her own behalf.   -added scheduled xanax at night which helped 5. Leucocytosis: reactive and slowly resolving. Will monitor for  fevers and other signs of infection.  6 Rectal bleeding due to [place]/irritation from rectal tube: follow hgb and symptoms.  -hemorrhoidal cream. 7. Acute on Chronic combined systolic/diastolic CHF:   Weights up a little this weekend    -Continue ASA, Demadex, Coreg and Vasotec.  -ACE increased by cards   -continue no added salt to diet  -pt maintaining sats when up with therapy. Stamina improving  ---encourage IS 8. Morbid obesity with OHS/OSA:  CPAP qhs. .  9. Acute on chronic respiratory failure: CPAP when asleep-used last night  -Pulmicort and Proventil inhalers for SOB. 2 L oxygen at all times.   -saline spray for nose, humidified o2  -flonase  -improved 10. 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 Continue Lantus 30 units daily with meal coverage and SSI for Elevated BS  -increased mealtime covg last week  -increase lantus further today  -will increase lantus slightly today  LOS (Days) 6 A FACE TO FACE EVALUATION WAS PERFORMED  Burnadette Baskett T 03/30/2013 7:55 AM

## 2013-03-30 NOTE — Progress Notes (Signed)
Social Work [name], female   DOB: 02-Jul-1940, 72 y.o.   MRN: [id number]  Alerted by therapists that they feel pt will be ready for d/c on Wed 11/19.  Spoke with Dr. Riley Kill who is in agreement as is the pt and her husband.  Will begin making DME and f/u arrangements.  Wilberth Damon, LCSW

## 2013-03-30 NOTE — Progress Notes (Addendum)
SUBJECTIVE: The patient is doing well today.  At this time, she denies chest pain, shortness of breath, or any new concerns.  She states her breathing is much improved after IV Lasix end of last week.    Is/Os not accurate.  Weight up 3 pounds 11-15 to 11-16.  No weight recorded this morning.   Patient states discharge plans are for her to go home tomorrow.   CURRENT MEDICATIONS: . ALPRAZolam  0.5 mg Oral QHS  . amLODipine  5 mg Oral Daily  . budesonide (PULMICORT) nebulizer solution  0.25 mg Nebulization BID  . carvedilol  6.25 mg Oral BID WC  . enalapril  10 mg Oral Daily  . enoxaparin (LOVENOX) injection  40 mg Subcutaneous Q24H  . feeding supplement (GLUCERNA SHAKE)  237 mL Oral Q supper  . fluticasone  2 spray Each Nare Daily  . gabapentin  200 mg Oral BID  . hydrocortisone   Rectal TID  . insulin aspart  0-15 Units Subcutaneous TID WC  . insulin aspart  0-5 Units Subcutaneous QHS  . insulin aspart  5 Units Subcutaneous TID WC  . insulin glargine  40 Units Subcutaneous Daily  . levothyroxine  25 mcg Oral QAC breakfast  . MUSCLE RUB   Topical QID  . sodium chloride  2 spray Each Nare QID  . torsemide  20 mg Oral BID      OBJECTIVE: Physical Exam: Filed Vitals:   03/29/13 1600 03/29/13 2022 03/29/13 2106 03/30/13 0615  BP: 143/47  167/64 118/55  Pulse: 74   75  Temp: 98.4 F (36.9 C)   98 F (36.7 C)  TempSrc: Oral   Oral  Resp: 19   20  Height:      Weight:      SpO2: 100% 100%  95%    Intake/Output Summary (Last 24 hours) at 03/30/13 0659 Last data filed at 03/29/13 1600  Gross per 24 hour  Intake    480 ml  Output    800 ml  Net   -320 ml    GEN- The patient is overweight and chronically ill appearing, alert and oriented x 3 today.   Head- normocephalic, atraumatic Eyes-  Sclera clear, conjunctiva pink Ears- hearing intact Oropharynx- clear Neck- supple, JPV 9cm Lungs- decreased BS at the bases, normal work of breathing Heart- Regular rate and  rhythm (paced)  GI- soft, NT, ND, + BS Extremities- no clubbing, cyanosis, + dependant edema (improved) Skin- no rash or lesion Psych- euthymic mood, full affect Neuro- strength and sensation are intact  LABS: Basic Metabolic Panel:  Recent Labs  16/10/96 0540 03/30/13 0500  NA 137 137  K 4.4 4.1  CL 97 97  CO2 31 33*  GLUCOSE 214* 256*  BUN 33* 35*  CREATININE 1.21* 1.17*  CALCIUM 8.8 8.3*   CBC:  Recent Labs  03/30/13 0500  WBC 5.0  HGB 9.8*  HCT 29.6*  MCV 91.9  PLT 164    RADIOLOGY: Dg Chest 1 View 03/26/2013   CLINICAL DATA:  Cough and shortness of breath. Hypertension. Diabetic. Recently intubated.  EXAM: CHEST - 1 VIEW  COMPARISON:  03/21/2013  FINDINGS: Mildly degraded exam due to AP portable technique and patient body habitus. Pacer/AICD device, unchanged.  Remote right rib trauma. Cardiomegaly accentuated by AP portable technique. No definite pleural fluid. No pneumothorax. Demented lung volumes with increased interstitial opacities. No lobar consolidation.  IMPRESSION: Worsened aeration with increased moderate congestive heart failure.  Decreased sensitivity and specificity  exam due to technique related factors, as described above.   Electronically Signed   By: Jeronimo Greaves M.D.   On: 03/26/2013 16:27   Dg Chest 2 View 03/27/2013   CLINICAL DATA:  CHF.  Cough.  Shortness of breath.  EXAM: CHEST  2 VIEW  COMPARISON:  03/26/2013.  FINDINGS: Mediastinum is normal. Persistent cardiomegaly and pulmonary vascular prominence with bilateral interstitial and alveolar infiltrates are again noted. Interstitial/alveolar edema has improved from prior exam. There is however progressive atelectasis in both lung bases. Prior CABG. Cardiac pacer in stable position. Old right rib fractures. No pneumothorax.  IMPRESSION: 1. Interim improvement of congestive heart failure and pulmonary edema. 2. Progressive bibasilar atelectasis. Underlying pneumonia cannot be excluded.   Electronically  Signed   By: Maisie Fus  Register   On: 03/27/2013 07:50   ASSESSMENT AND PLAN:  Principal Problem:   Physical deconditioning Active Problems:   ATRIAL FIBRILLATION, PAROXYSMAL   BACK PAIN, CHRONIC   OSA (obstructive sleep apnea)   History of Cardiomyopathy, ischemic   CKD (chronic kidney disease) stage 3, GFR 30-59 ml/min   Acute on chronic combined systolic and diastolic heart failure   Pulmonary edema   Epistaxis   Rectal bleeding--due to hems/rectal tube irritation  1. Acute on chronic combined systolic and diastolic dysfunction with acute respiratory failure and hypoxia Improved  Continue current torsemide dosing Increase enalapril to 20mg  daily Will need outpatient follow-up and BMET  2. afib Rate controlled Restart coumadin today  3. Ischemic cardiomyopathy No ischemic symptoms As above  Follow up scheduled with Tereso Newcomer, PA 04-07-13 at [facility].  Will see as needed while here Please call with questions

## 2013-03-30 NOTE — Progress Notes (Signed)
Occupational Therapy Session Note  Patient Details  Name: Destiny [name] MRN: [id number] Date of Birth: [birth year]  Today's Date: 03/30/2013 Time: 1100-1200 Time Calculation (min): 60 min  Short Term Goals: Week 1:  OT Short Term Goal 1 (Week 1): STG=LTG-= short LOS  Skilled Therapeutic Interventions: ADL-retraining with emphasis on functional mobility using RW, transfers (to/from tub bench), endurance, and performance of iADL (simple meal prep: preparing a sandwich).    Patient was greeted in her room with her husband attending session.   Patient performed functional mobility using RW within her room and required only supervision to manage oxygen lines while she walked from recliner to sink to wash her face and comb her hair and then to walk-in shower to demonstrate transfer skills to tub bench.   Patient able to stand to perform grooming, independently modified by use of RW and leaning on sink to maintain balance.   From her room patient was escorted to kitchen to demonstrate her meal prep skills.  Patient completed simple meal prep, making a peanut butter and jelly sandwich, with min assist to gather supplies while she sat on a swivel stool to prepare sandwich.   Patient demonstrated good thoroughness with no evidence of LOB or fatigue during activity.   Patient required supervision throughout session with min verbal cues to remind her to lock her brakes, manage her oxygen lines, and to maintain proper distances before attempting transfers.     Therapy Documentation Precautions:  Precautions Precautions: Fall Precaution Comments: monitor O2 saturation Restrictions Weight Bearing Restrictions: No  Vital Signs: Oxygen Therapy SpO2: 98 % O2 Device: Nasal cannula O2 Flow Rate (L/min): 2 L/min  Pain: Pain Assessment Pain Assessment: No/denies pain  See FIM for current functional status  Therapy/Group: Individual Therapy  Keionte Swicegood 03/30/2013, 12:24 PM

## 2013-03-30 NOTE — Progress Notes (Signed)
ANTICOAGULATION CONSULT NOTE - Follow Up Consult  Pharmacy Consult for coumadin Indication: atrial fibrillation  Allergies  Allergen Reactions  . Sulfonamide Derivatives Other (See Comments)    Unknown; "childhood allergy/mother"    Patient Measurements: Height: 5\' 4"  (162.6 cm) Weight: 263 lb 14.3 oz (119.7 kg) IBW/kg (Calculated) : 54.7 Heparin Dosing Weight:   Vital Signs: Temp: 98 F (36.7 C) (11/17 0615) Temp src: Oral (11/17 0615) BP: 118/55 mmHg (11/17 0615) Pulse Rate: 75 (11/17 0615)  Labs:  Recent Labs  03/28/13 0540 03/30/13 0500  HGB  --  9.8*  HCT  --  29.6*  PLT  --  164  CREATININE 1.21* 1.17*    Estimated Creatinine Clearance: 57 ml/min (by C-G formula based on Cr of 1.17).   Medications:  Scheduled:  . ALPRAZolam  0.5 mg Oral QHS  . amLODipine  5 mg Oral Daily  . budesonide (PULMICORT) nebulizer solution  0.25 mg Nebulization BID  . carvedilol  6.25 mg Oral BID WC  . enalapril  20 mg Oral Daily  . enoxaparin (LOVENOX) injection  40 mg Subcutaneous Q24H  . feeding supplement (GLUCERNA SHAKE)  237 mL Oral Q supper  . fluticasone  2 spray Each Nare Daily  . gabapentin  200 mg Oral BID  . hydrocortisone   Rectal TID  . insulin aspart  0-15 Units Subcutaneous TID WC  . insulin aspart  0-5 Units Subcutaneous QHS  . insulin aspart  5 Units Subcutaneous TID WC  . insulin glargine  40 Units Subcutaneous Daily  . levothyroxine  25 mcg Oral QAC breakfast  . MUSCLE RUB   Topical QID  . sodium chloride  2 spray Each Nare QID  . torsemide  20 mg Oral BID   Infusions:    Assessment: 72 yo female with hx of afib will be resumed on coumadin.  It was held at one point due to oozing.  CBC stable today.  INR is 0.82 Goal of Therapy:  INR 2-3 Monitor platelets by anticoagulation protocol: Yes   Plan:  - Coumadin 7.5mg  po x1 - Cont lovenox 40mg  until INR >2 - Daily PT/INR   Azaya Goedde, Tsz-Yin 03/30/2013,8:41 AM

## 2013-03-30 NOTE — Progress Notes (Addendum)
Inpatient Diabetes Program Recommendations  AACE/ADA: New Consensus Statement on Inpatient Glycemic Control (2013)  Target Ranges:  Prepandial:   less than 140 mg/dL      Peak postprandial:   less than 180 mg/dL (1-2 hours)      Critically ill patients:  140 - 180 mg/dL   Pt is getting an average of 13 units (correction plus meal coverage) with each meal.  This is only maintaining glucose levels in the mid to upper 200's.:  Pt's home total daily basal insulin calculates to 69 units and total bolus (correction and meal coverage) calculates to 75 units.  In order to get glucose under control, please consider the following (which is still less insulin than she takes at home.) Inpatient Diabetes Program Recommendations Insulin - Basal: Please increase lantus to 55-60 units (can use 30 units am and HS) Insulin - Meal Coverage: Increase meal coverage to 8 units tidwc.  Thank you, Lenor Coffin, RN, CNS, Diabetes Coordinator [phone])

## 2013-03-30 NOTE — Progress Notes (Signed)
Occupational Therapy Session Note  Patient Details  Name: Destiny [name] MRN: [id number] Date of Birth: [birth year]  Today's Date: 03/30/2013 Time: 0800-0855 Time Calculation (min): 55 min  Short Term Goals: Week 1:  OT Short Term Goal 1 (Week 1): STG=LTG-= short LOS  Skilled Therapeutic Interventions/Progress Updates:    Pt engaged in bathing and dressing tasks w/c level at sink.  Pt's husband present for session.  Pt amb with RW to sink and completed grooming tasks while standing at sink.  Pt completed remainder of tasks with sit<>stand from w/c.  Pt did not don socks this morning but completed all remaining dressing tasks with supervision using AE appropriately.  Pt stated she used reacher and shoe horn at home to assist.  Pt O2 sats dropped to 85% on RA while donning shirt but recovered to 90% with pursed lip breathing within 30 seconds.  Focus on activity tolerance, standing balance,safety awareness, and functional amb with RW.  Therapy Documentation Precautions:  Precautions Precautions: Fall Precaution Comments: monitor O2 saturation Restrictions Weight Bearing Restrictions: No Pain: Pain Assessment Pain Assessment: No/denies pain  See FIM for current functional status  Therapy/Group: Individual Therapy  Rich Brave 03/30/2013, 8:55 AM

## 2013-03-30 NOTE — Progress Notes (Signed)
Physical Therapy Session Note  Patient Details  Name: Destiny [name] MRN: [id number] Date of Birth: [birth year]  Today's Date: 03/30/2013 Time: 4098-1191 Time Calculation (min): 45 min  Short Term Goals: Week 1:  PT Short Term Goal 1 (Week 1): STG's=LTG's due to short ELOS.  Skilled Therapeutic Interventions/Progress Updates:    Pt received seated in bedside chair on 3L/min of supplemental O2, Ashley. Pt denies pain and is agreeable to session. Session focused on initiating HEP and performing functional tranfers/mobility in home setting while managing oxygen tubing (as pt's current home setup requires her to negotiate obstacles with 50' of O2 tubing). Pt propelled w/c with bilat UE/LE's x100' for muscular endurance. Gait x25' in controlled environment with rolling walker, supervision/  PT verbally explained and demonstrated the following home exercises: seated hip adductor pillow squeeze x10 reps; seated hip abduction resisted by Tband x10 reps; partial squats with bilat UE support 2x5 reps; and seated bilat SAQ x10 reps per side. Concurrent verbal cues required to remind pt of proper pacing of breathing with each repetition. Pt verbalized understanding but required verbal reinforcement throughout. Paper handout of HEP and Tband provided to facilitate carryover.  In apartment, pt performed ambulation x25' total (home environment) with rolling walker and supervision and safe/effective management of >20' of O2 tubing. Standing, bilat sidestepping with rolling walker and supervision to negotiate of narrow space beside bed (to simulate pt-described home setup). Sit<>stand from standard bed to rolling walker, supervision and verbal cueing for hand placement. Supine<>sit on apartment bed without rails. Therapist transported pt to room in w/c secondary to pt fatigue.  Therapist departed with patient seated in bedside chair with husband present, quick release belt on, on 3L/min supplemental O2 via , and all  needs in reach.  Therapy Documentation Precautions:  Precautions Precautions: Fall Precaution Comments: monitor O2 saturation Restrictions Weight Bearing Restrictions: No Locomotion : Ambulation Ambulation/Gait Assistance: 5: Supervision Wheelchair Mobility Distance: 100   See FIM for current functional status  Therapy/Group: Individual Therapy  Discharged goal for w/c propulsion in home environment, as pt performs short-distance ambulation within home or motorized w/c, as needed. Downgraded long term goals for ambulation in controlled and home environments.  Calvert Cantor 03/30/2013, 3:47 PM

## 2013-03-30 NOTE — Progress Notes (Signed)
Patient verbalized safety needs this am . Refused quick release belt while husband in room but reports she will wear it up in chair if he is not there. Patient has called throughout day . No attempts out of chair . Educated husband on safety plan . Husband agrees with patient to provide safety awareness while in room . Continue with plan of care .  Destiny [name]

## 2013-03-30 NOTE — Progress Notes (Signed)
Pt has home machine in room and will place on when ready. Pt encouraged to call RT if needing any assistance. No distress noted at this time. Family at bedside.

## 2013-03-31 ENCOUNTER — Inpatient Hospital Stay (HOSPITAL_COMMUNITY): Payer: Medicare Other

## 2013-03-31 ENCOUNTER — Encounter (HOSPITAL_COMMUNITY): Payer: Medicare Other | Admitting: Occupational Therapy

## 2013-03-31 DIAGNOSIS — R5381 Other malaise: Secondary | ICD-10-CM

## 2013-03-31 DIAGNOSIS — I509 Heart failure, unspecified: Secondary | ICD-10-CM

## 2013-03-31 DIAGNOSIS — D62 Acute posthemorrhagic anemia: Secondary | ICD-10-CM

## 2013-03-31 DIAGNOSIS — E1165 Type 2 diabetes mellitus with hyperglycemia: Secondary | ICD-10-CM

## 2013-03-31 DIAGNOSIS — E669 Obesity, unspecified: Secondary | ICD-10-CM

## 2013-03-31 LAB — GLUCOSE, CAPILLARY
Glucose-Capillary: 244 mg/dL — ABNORMAL HIGH (ref 70–99)
Glucose-Capillary: 195 mg/dL — ABNORMAL HIGH (ref 70–99)

## 2013-03-31 LAB — PROTIME-INR: Prothrombin Time: 12.9 seconds (ref 11.6–15.2)

## 2013-03-31 MED ORDER — WARFARIN SODIUM 7.5 MG PO TABS
7.5000 mg | ORAL_TABLET | Freq: Once | ORAL | Status: AC
  Administered 2013-03-31: 7.5 mg via ORAL
  Filled 2013-03-31: qty 1

## 2013-03-31 MED ORDER — INSULIN ASPART 100 UNIT/ML ~~LOC~~ SOLN
10.0000 [IU] | Freq: Three times a day (TID) | SUBCUTANEOUS | Status: DC
  Administered 2013-03-31 – 2013-04-01 (×2): 10 [IU] via SUBCUTANEOUS

## 2013-03-31 MED ORDER — INSULIN NPH (HUMAN) (ISOPHANE) 100 UNIT/ML ~~LOC~~ SUSP
44.0000 [IU] | Freq: Every day | SUBCUTANEOUS | Status: DC
  Administered 2013-04-01: 44 [IU] via SUBCUTANEOUS
  Filled 2013-03-31: qty 10

## 2013-03-31 MED ORDER — HYDROCORTISONE 2.5 % RE CREA
TOPICAL_CREAM | Freq: Three times a day (TID) | RECTAL | Status: DC | PRN
  Filled 2013-03-31: qty 28.35

## 2013-03-31 MED ORDER — INSULIN NPH (HUMAN) (ISOPHANE) 100 UNIT/ML ~~LOC~~ SUSP
20.0000 [IU] | Freq: Every day | SUBCUTANEOUS | Status: DC
  Administered 2013-03-31: 20 [IU] via SUBCUTANEOUS
  Filled 2013-03-31 (×2): qty 10

## 2013-03-31 NOTE — Progress Notes (Signed)
Occupational Therapy Discharge Summary  Patient Details  Name: Destiny [name] MRN: [id number] Date of Birth: 1940/08/07  Today's Date: 03/31/2013 Time: 1440-1520 Time Calculation (min): 40 min  Patient has met 9 of 9 long term goals due to improved activity tolerance and improved balance.  Patient to discharge at overall Modified Independent level.  Patient's care partner is independent to provide the necessary physical assistance at discharge.    Reasons goals not met: NA  Recommendation:  Feel pt will need intermittent supervision for shower transfers as well as implementation of energy conservation strategies.  However do not feel that she warrants any further OT needs at this time post rehab stay.  Pt is still O2 dependent and has learned how to pace herself and take needed rest breaks during ADL tasks.  Equipment: No equipment provided  Reasons for discharge: treatment goals met and discharge from hospital  Patient/family agrees with progress made and goals achieved: Yes  OT Discharge Precautions/Restrictions  Precautions Precautions: Fall Precaution Comments: monitor O2 saturation  Vital Signs Therapy Vitals Temp: 98.3 F (36.8 C) Temp src: Oral Pulse Rate: 80 Resp: 20 BP: 155/72 mmHg Patient Position, if appropriate: Sitting Oxygen Therapy SpO2: 97 % O2 Device: Nasal cannula O2 Flow Rate (L/min): 2 L/min Pain Pain Assessment Pain Assessment: No/denies pain ADL  See FIM scale  Vision/Perception  Vision - History Baseline Vision: Wears glasses only for reading Patient Visual Report: No change from baseline Vision - Assessment Vision Assessment: Vision not tested Perception Perception: Within Functional Limits Praxis Praxis: Intact  Cognition Overall Cognitive Status: Within Functional Limits for tasks assessed Arousal/Alertness: Awake/alert Orientation Level: Oriented X4 Safety/Judgment: Appears intact Sensation Sensation Light Touch: Appears  Intact (for bilateral UEs) Stereognosis: Appears Intact Hot/Cold: Appears Intact Proprioception: Appears Intact Coordination Gross Motor Movements are Fluid and Coordinated: Yes Fine Motor Movements are Fluid and Coordinated: Yes Motor  Motor Motor: Within Functional Limits Mobility  Transfers Transfers: Sit to Stand Sit to Stand: 6: Modified independent (Device/Increase time);From bed;With upper extremity assist Stand to Sit: 6: Modified independent (Device/Increase time);To toilet  Trunk/Postural Assessment  Cervical Assessment Cervical Assessment: Within Functional Limits Thoracic Assessment Thoracic Assessment: Within Functional Limits Lumbar Assessment Lumbar Assessment: Within Functional Limits Postural Control Postural Control: Within Functional Limits  Balance SStatic Standing Balance Static Standing - Balance Support: No upper extremity supported Static Standing - Level of Assistance: 6: Modified independent (Device/Increase time) Dynamic Standing Balance Dynamic Standing - Balance Support: Bilateral upper extremity supported;During functional activity Dynamic Standing - Level of Assistance: 6: Modified independent (Device/Increase time) Extremity/Trunk Assessment RUE Assessment RUE Assessment: Within Functional Limits LUE Assessment LUE Assessment: Within Functional Limits  See FIM for current functional status  Girtie Wiersma OTR/L 03/31/2013, 4:37 PM

## 2013-03-31 NOTE — Progress Notes (Signed)
Subjective/Complaints: Using cpap. No major issues last night.   A 12 point review of systems has been performed and if not noted above is otherwise negative.   Objective: Vital Signs: Blood pressure 147/57, pulse 71, temperature 97.4 F (36.3 C), temperature source Oral, resp. rate 18, height 5\' 4"  (1.626 m), weight 117.7 kg (259 lb 7.7 oz), SpO2 100.00%. No results found.  Recent Labs  03/30/13 0500  WBC 5.0  HGB 9.8*  HCT 29.6*  PLT 164    Recent Labs  03/30/13 0500  NA 137  K 4.1  CL 97  GLUCOSE 256*  BUN 35*  CREATININE 1.17*  CALCIUM 8.3*   CBG (last 3)   Recent Labs  03/30/13 1145 03/30/13 1651 03/30/13 2145  GLUCAP 297* 174* 215*    Wt Readings from Last 3 Encounters:  03/31/13 117.7 kg (259 lb 7.7 oz)  03/20/13 122.9 kg (270 lb 15.1 oz)  01/15/13 123.741 kg (272 lb 12.8 oz)    Physical Exam:  Constitutional: She is oriented to person, place, and time. She appears well-developed and well-nourished.  Obese. Appears comfortable HENT:  Head: Normocephalic and atraumatic.  Right Ear: External ear normal.  Left Ear: External ear normal.  Eyes: Conjunctivae and EOM are normal. Pupils are equal, round, and reactive to light.  Neck: No JVD present. No tracheal deviation present. No thyromegaly present.  Cardiovascular: Normal rate and intact distal pulses. Reg rhythm Respiratory: Effort normal and breath sounds normal. No respiratory distress. She has a few wheezes and rhonchi today. Oxygen via [facility]: She exhibits no distension. There is no tenderness.  Musculoskeletal: She exhibits edema (trace to 1+ edema in legs).  Lymphadenopathy:  She has no cervical adenopathy.  Neurological: She is alert and oriented to person, place, and time. She has normal reflexes. No cranial nerve deficit. Coordination normal.  Pt appropriate. Good insight and awareness. Sensation intact in all 4s except for mild decrease in distal LE's/feet. UE strength 4-4+/5. LE 3/5 HF  to 4/5 at ankles. CN non-focal.  Psychiatric: She has a normal mood and affect. Her behavior is normal. Judgment and thought content normal.     Assessment/Plan: 1. Functional deficits secondary to deconditioning after acute on chronic respiratory failure which require 3+ hours per day of interdisciplinary therapy in a comprehensive inpatient rehab setting. Physiatrist is providing close team supervision and 24 hour management of active medical problems listed below. Physiatrist and rehab team continue to assess barriers to discharge/monitor patient progress toward functional and medical goals. FIM: FIM - Bathing Bathing Steps Patient Completed: Chest;Right Arm;Left Arm;Abdomen;Front perineal area;Buttocks;Right lower leg (including foot);Left lower leg (including foot);Right upper leg;Left upper leg Bathing: 5: Set-up assist to: Adjust water temp  FIM - Upper Body Dressing/Undressing Upper body dressing/undressing steps patient completed: Thread/unthread right sleeve of pullover shirt/dresss;Thread/unthread left sleeve of pullover shirt/dress;Put head through opening of pull over shirt/dress;Pull shirt over trunk Upper body dressing/undressing: 5: Set-up assist to: Obtain clothing/put away FIM - Lower Body Dressing/Undressing Lower body dressing/undressing steps patient completed: Thread/unthread left underwear leg;Thread/unthread right underwear leg;Pull underwear up/down;Thread/unthread right pants leg;Thread/unthread left pants leg;Pull pants up/down;Don/Doff right shoe;Don/Doff left shoe Lower body dressing/undressing: 4: Min-Patient completed 75 plus % of tasks  FIM - Toileting Toileting steps completed by patient: Adjust clothing prior to toileting;Performs perineal hygiene;Adjust clothing after toileting Toileting Assistive Devices: Grab bar or rail for support Toileting: 6: More than reasonable amount of time  FIM - Diplomatic Services operational officer Devices: Grab  bars;Walker Toilet Transfers:  5-To toilet/BSC: Supervision (verbal cues/safety issues);5-From toilet/BSC: Supervision (verbal cues/safety issues)  FIM - Bed/Chair Transfer Bed/Chair Transfer Assistive Devices: Therapist, occupational: 5: Chair or W/C > Bed: Supervision (verbal cues/safety issues);5: Bed > Chair or W/C: Supervision (verbal cues/safety issues);5: Supine > Sit: Supervision (verbal cues/safety issues);5: Sit > Supine: Supervision (verbal cues/safety issues)  FIM - Locomotion: Wheelchair Distance: 100 Locomotion: Wheelchair: 2: Travels 50 - 149 ft with supervision, cueing or coaxing FIM - Locomotion: Ambulation Locomotion: Ambulation Assistive Devices: Designer, industrial/product Ambulation/Gait Assistance: 5: Supervision Locomotion: Ambulation: 2: Travels 50 - 149 ft with supervision/safety issues  Comprehension Comprehension Mode: Auditory Comprehension: 5-Understands complex 90% of the time/Cues < 10% of the time  Expression Expression Mode: Verbal Expression: 5-Expresses complex 90% of the time/cues < 10% of the time  Social Interaction Social Interaction: 6-Interacts appropriately with others with medication or extra time (anti-anxiety, antidepressant).  Problem Solving Problem Solving: 5-Solves basic problems: With no assist  Memory Memory: 6-More than reasonable amt of time  Medical Problem List and Plan:  Deconditioning due to acute on chronic respiratory failure due to fluid overload in setting of OHS/OSA.  1. DVT Prophylaxis/Anticoagulation: Pharmaceutical: Sq lovenox  2. Chronic back pain/ Pain Management: continue Neurontin bid with tylenol prn.  3. Mood: generally appropriate.  4. Neuropsych: This patient is capable of making decisions on her own behalf.   -added scheduled xanax at night which helped 5. Leucocytosis: reactive and slowly resolving. Will monitor for fevers and other signs of infection.  6 Rectal bleeding due to Hems/irritation from rectal tube:  follow hgb and symptoms.  -hemorrhoidal cream. 7. Acute on Chronic combined systolic/diastolic CHF:   Weights up a little this weekend    -Continue ASA, Demadex, Coreg and Vasotec.  -ACE increased by cards   -continue no added salt to diet  -pt maintaining sats when up with therapy. Stamina improving  ---encouraging IS 8. Morbid obesity with OHS/OSA:  CPAP qhs. .  9. Acute on chronic respiratory failure: CPAP when asleep-used last night  -Pulmicort and Proventil inhalers for SOB. 2 L oxygen at all times.   -saline spray for nose, humidified o2  -flonase  -improved 10. 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 Continue Lantus 30 units daily with meal coverage and SSI for Elevated BS  -increased mealtime covg last week--may need further adjustment as well  -increased lantus yesterday--observe for effect today.    LOS (Days) 7 A FACE TO FACE EVALUATION WAS PERFORMED  Narayan Scull T 03/31/2013 7:36 AM

## 2013-03-31 NOTE — Progress Notes (Signed)
Physical Therapy Discharge Summary  Patient Details  Name: Destiny [name] MRN: [id number] Date of Birth: [birth year]  Today's Date: 03/31/2013  Session #2: Time: 1440-1520 Time Calculation (min): 40 min Individual therapy; Denies pain. Focused session on negotiation of ramp and curb step with RW to simulate home entry (overall S) and re-administering Berg Balance test (see below in d/c summary) including discussion of results. Pt demonstrated significant improvement in Berg Balance score from initial testing but still at almost 100% risk of falls if not using AD. Pt verbalized understanding. Pt and husband feel confident for d/c tomorrow and report having no further questions for therapy. Pt is mod I in room since this morning.    Patient has met 8 of 8 long term goals due to improved activity tolerance, improved balance, improved postural control and ability to compensate for deficits.  Patient to discharge at a household ambulatory level Modified Independent with RW.  Patient's husband is independent to provide the necessary supervision assistance asneeded at discharge.  Reasons goals not met: n/a. All goals met at this time.  Recommendation:  Patient will benefit from ongoing skilled PT services in home health setting to continue to advance safe functional mobility, address ongoing impairments in gait, balance, endurance, functional strength, and minimize fall risk.  Equipment: No equipment provided. Pt had already been issued RW prior to admission.  Reasons for discharge: treatment goals met and discharge from hospital  Patient/family agrees with progress made and goals achieved: Yes  PT Discharge Precautions/Restrictions Precautions Precautions: Fall Precaution Comments: monitor O2 saturation Restrictions Weight Bearing Restrictions: No Vital Signs O2 Device: Nasal cannula O2 Flow Rate (L/min): 2 L/min Vision/Perception  Perception Perception: Within Functional  Limits Praxis Praxis: Intact  Cognition Overall Cognitive Status: Within Functional Limits for tasks assessed Safety/Judgment: Appears intact Sensation Sensation Light Touch: Impaired by gross assessment (same as admission; peripheral neuropathy below shin) Coordination Gross Motor Movements are Fluid and Coordinated: Yes Motor  Motor Motor: Within Functional Limits  Locomotion  Ambulation Ambulation/Gait Assistance: 6: Modified independent (Device/Increase time)  Trunk/Postural Assessment  Cervical Assessment Cervical Assessment: Within Functional Limits Thoracic Assessment Thoracic Assessment: Within Functional Limits (kyphotic) Lumbar Assessment Lumbar Assessment: Within Functional Limits (posterior pelvic tilt) Postural Control Postural Control: Within Functional Limits  Balance Static and Dynamic Sitting balance: Mod I Static and Dynamic Standing balance: Mod I with RW  Standardized Balance Assessment Standardized Balance Assessment: Berg Balance Test Berg Balance Test Sit to Stand: Able to stand  independently using hands Standing Unsupported: Able to stand 2 minutes with supervision Sitting with Back Unsupported but Feet Supported on Floor or Stool: Able to sit safely and securely 2 minutes Stand to Sit: Controls descent by using hands Transfers: Able to transfer safely, definite need of hands Standing Unsupported with Eyes Closed: Able to stand 10 seconds with supervision Standing Ubsupported with Feet Together: Able to place feet together independently and stand for 1 minute with supervision From Standing, Reach Forward with Outstretched Arm: Can reach forward >12 cm safely (5") From Standing Position, Pick up Object from Floor: Able to pick up shoe, needs supervision From Standing Position, Turn to Look Behind Over each Shoulder: Turn sideways only but maintains balance Turn 360 Degrees: Able to turn 360 degrees safely but slowly Standing Unsupported, Alternately  Place Feet on Step/Stool: Able to complete >2 steps/needs minimal assist Standing Unsupported, One Foot in Front: Needs help to step but can hold 15 seconds Standing on One Leg: Unable to try or needs assist to  prevent fall Total Score: 34 Extremity Assessment  RLE Assessment RLE Assessment:  (grossly 4/5; decreased muscular endurance) LLE Assessment LLE Assessment:  (grossly 4/5; decreased muscular endurance)  See FIM for current functional status  Karolee Stamps [facility] 03/31/2013, 9:52 AM

## 2013-03-31 NOTE — Progress Notes (Signed)
Inpatient Diabetes Program Recommendations  AACE/ADA: New Consensus Statement on Inpatient Glycemic Control (2013)  Target Ranges:  Prepandial:   less than 140 mg/dL      Peak postprandial:   less than 180 mg/dL (1-2 hours)      Critically ill patients:  140 - 180 mg/dL   Reason for Visit: Hyperglycemia  Results for Destiny [name], Destiny [name] (MRN [id number]) as of 03/31/2013 10:40  Ref. Range 03/30/2013 07:33 03/30/2013 08:45 03/30/2013 11:45 03/30/2013 16:51 03/30/2013 21:45 03/31/2013 06:40 03/31/2013 07:19  Glucose-Capillary Latest Range: 70-99 mg/dL 010 (H)  272 (H) 536 (H) 215 (H)  200 (H)   Pt is getting an average of 13 units (correction plus meal coverage) with each meal. This is only maintaining glucose levels in the mid to upper 200's.:  Pt's home total daily basal insulin calculates to 69 units and total bolus (correction and meal coverage) calculates to 75 units.  In order to get glucose under control, please consider the following (which is still less insulin than she takes at home.)  Inpatient Diabetes Program Recommendations  Insulin - Basal: Please increase lantus to 55-60 units (can use 30 units am and HS)  Insulin - Meal Coverage: Increase meal coverage to 8 units tidwc.   Thank you, Lenor Coffin, RN, CNS, Diabetes Coordinator [phone])

## 2013-03-31 NOTE — Progress Notes (Signed)
Occupational Therapy Note  Patient Details  Name: Destiny [name] MRN: [id number] Date of Birth: 04/22/1941 Today's Date: 03/31/2013  Time: 1330-1400 Pt denies pain Individual Therapy  Pt resting in recliner upon arrival.  Pt had been made mod I in room earlier in day.  Pt requested use of toilet before ambulating to ADL apartment to engage in home mgmt tasks.  Pt required one sitting rest break and one standing rest break during walk to ADL apartment.  Pt completed all tasks at mod I level with rest breaks taken appropriately.  Focus on activity tolerance, dynamic standing balance, functional amb with RW, energy conservation, and safety awareness.   Lavone Neri [facility] 03/31/2013, 2:51 PM

## 2013-03-31 NOTE — Progress Notes (Signed)
ANTICOAGULATION CONSULT NOTE - Follow Up Consult  Pharmacy Consult for coumadin Indication: atrial fibrillation  Allergies  Allergen Reactions  . Sulfonamide Derivatives Other (See Comments)    Unknown; "childhood allergy/mother"    Patient Measurements: Height: 5\' 4"  (162.6 cm) Weight: 259 lb 7.7 oz (117.7 kg) IBW/kg (Calculated) : 54.7 Heparin Dosing Weight:   Vital Signs: Temp: 97.4 F (36.3 C) (11/18 0708) BP: 147/57 mmHg (11/18 0708) Pulse Rate: 71 (11/18 0708)  Labs:  Recent Labs  03/30/13 0500 03/30/13 0845 03/31/13 0640  HGB 9.8*  --   --   HCT 29.6*  --   --   PLT 164  --   --   LABPROT  --  11.2* 12.9  INR  --  0.82 0.99  CREATININE 1.17*  --   --     Estimated Creatinine Clearance: 56.4 ml/min (by C-G formula based on Cr of 1.17).   Medications:  Scheduled:  . ALPRAZolam  0.5 mg Oral QHS  . amLODipine  5 mg Oral Daily  . budesonide (PULMICORT) nebulizer solution  0.25 mg Nebulization BID  . carvedilol  6.25 mg Oral BID WC  . enalapril  20 mg Oral Daily  . enoxaparin (LOVENOX) injection  40 mg Subcutaneous Q24H  . feeding supplement (GLUCERNA SHAKE)  237 mL Oral Q supper  . fluticasone  2 spray Each Nare Daily  . gabapentin  200 mg Oral BID  . insulin aspart  0-15 Units Subcutaneous TID WC  . insulin aspart  0-5 Units Subcutaneous QHS  . insulin aspart  5 Units Subcutaneous TID WC  . insulin glargine  40 Units Subcutaneous Daily  . levothyroxine  25 mcg Oral QAC breakfast  . MUSCLE RUB   Topical QID  . sodium chloride  2 spray Each Nare QID  . torsemide  20 mg Oral BID  . Warfarin - Pharmacist Dosing Inpatient   Does not apply q1800   Infusions:    Assessment: 72 yo female with afib is currently on subtherapeutic coumadin.  It was just restarted yesterday.  INR today is 0.99.  Patient is also on lovenox 40mg  sq q24h Goal of Therapy:  INR 2-3 Monitor platelets by anticoagulation protocol: Yes   Plan:  - Coumadin 7.5mg  po x1 - Cont  lovenox 40mg  until INR >2   Leopoldo Mazzie, Tsz-Yin 03/31/2013,8:36 AM

## 2013-03-31 NOTE — Progress Notes (Signed)
Occupational Therapy Session Note  Patient Details  Name: ALESIA OSHIELDS MRN: [id number] Date of Birth: [birth year]  Today's Date: 03/31/2013 Time: 1100-1200 Time Calculation (min): 60 min  Short Term Goals: Week 1:  OT Short Term Goal 1 (Week 1): STG=LTG-= short LOS  Skilled Therapeutic Interventions/Progress Updates:    Pt performed bathing and dressing this session.  Overall modified independent using the RW to gather her clothing and for all transfers using the RW.  Therapist provided supervision when transferring from the shower and for getting up the excess water that had fallen on the floor.  Pt utilized the long handle sponge for washing her feet as well as washing her peri area.  Nasal cannula also remained in place during session.  Pt performed dressing sit to stand from EOB, also utilizing the the reacher.  Discussed use of the shower seat for energy conservation as well as AE.  She reports having a couple of reachers already.  Educated her on availability of a sock aide and LH shoe horn as well.  O2 sats checked at 88-89 % on 2Ls with activity.  Therapy Documentation Precautions:  Precautions Precautions: Fall Precaution Comments: monitor O2 saturation Restrictions Weight Bearing Restrictions: No  Pain: Pain Assessment Pain Assessment: No/denies pain Pain Score: 0-No pain ADL: See FIM for current functional status  Therapy/Group: Individual Therapy  Deanette Tullius OTR/L 03/31/2013, 12:32 PM

## 2013-03-31 NOTE — Discharge Summary (Signed)
Physician Discharge Summary  [name] MRN: [id number] DOB/AGE: [birth year] 72 y.o.  Admit date: 03/24/2013 Discharge date: 04/01/2013  Discharge Diagnoses:  Principal Problem:   Physical deconditioning Active Problems:   ATRIAL FIBRILLATION, PAROXYSMAL   BACK PAIN, CHRONIC   OSA (obstructive sleep apnea)   History of Cardiomyopathy, ischemic   CKD (chronic kidney disease) stage 3, GFR 30-59 ml/min   Acute on chronic combined systolic and diastolic heart failure   Pulmonary edema   Epistaxis   Rectal bleeding--due to hems/rectal tube irritation   Discharged Condition:  Stable  Significant Diagnostic Studies: Dg Chest 1 View  03/26/2013   CLINICAL DATA:  Cough and shortness of breath. Hypertension. Diabetic. Recently intubated.  EXAM: CHEST - 1 VIEW  COMPARISON:  03/21/2013  FINDINGS: Mildly degraded exam due to AP portable technique and patient body habitus. Pacer/AICD device, unchanged.  Remote right rib trauma. Cardiomegaly accentuated by AP portable technique. No definite pleural fluid. No pneumothorax. Demented lung volumes with increased interstitial opacities. No lobar consolidation.  IMPRESSION: Worsened aeration with increased moderate congestive heart failure.  Decreased sensitivity and specificity exam due to technique related factors, as described above.   Electronically Signed   By: Jeronimo Greaves M.D.   On: 03/26/2013 16:27   Dg Chest 2 View  03/27/2013   CLINICAL DATA:  CHF.  Cough.  Shortness of breath.  EXAM: CHEST  2 VIEW  COMPARISON:  03/26/2013.  FINDINGS: Mediastinum is normal. Persistent cardiomegaly and pulmonary vascular prominence with bilateral interstitial and alveolar infiltrates are again noted. Interstitial/alveolar edema has improved from prior exam. There is however progressive atelectasis in both lung bases. Prior CABG. Cardiac pacer in stable position. Old right rib fractures. No pneumothorax.  IMPRESSION: 1. Interim improvement of  congestive heart failure and pulmonary edema. 2. Progressive bibasilar atelectasis. Underlying pneumonia cannot be excluded.   Electronically Signed   By: Maisie Fus  Register   On: 03/27/2013 07:50     Labs:  Basic Metabolic Panel:  Recent Labs Lab 03/27/13 0530 03/28/13 0540 03/30/13 0500  NA 142 137 137  K 4.4 4.4 4.1  CL 100 97 97  CO2 33* 31 33*  GLUCOSE 174* 214* 256*  BUN 31* 33* 35*  CREATININE 1.10 1.21* 1.17*  CALCIUM 8.9 8.8 8.3*    CBC:  Recent Labs Lab 03/27/13 0530 03/30/13 0500 04/01/13 0812  WBC 7.2 5.0 7.0  HGB 10.2* 9.8* 10.9*  HCT 32.6* 29.6* 32.0*  MCV 94.8 91.9 89.4  PLT 170 164 199    CBG:  Recent Labs Lab 03/31/13 1154 03/31/13 1719 03/31/13 2028 04/01/13 0738 04/01/13 1143  GLUCAP 240* 244* 195* 180* 218*    Brief HPI:   Destiny [name] is a 72 y.o. 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  She was treated with diuresis for acute on chronic CHF. Hospital course complicated by PNA as well as acute renal failure as well as rectal bleeding. Respiratory status has stabilized but patient reported to have epistaxis as well as rectal bleeding due to hems/rectal tube irritation. Cardiology recommended holding coumadin  X 5 days then resuming if no recurrent bleeding noted. Patient is noted to be deconditioned and CIR recommended by therapy team.     Hospital Course: Destiny [name] was admitted to rehab 03/24/2013 for inpatient therapies to consist of  PT, ST and OT at least three hours five days a week. Past admission physiatrist, therapy team and rehab RN have worked together to provide customized collaborative inpatient rehab. She had problems with PND with difficulty sleeping past admission. She was reported have insomnia due to anxiety therefore xanax was scheduled at HS. CXR done  showed evidence of CHF and she was treated with IV lasix. Cardiology was consulted for input and has adjusted her diuretics. Patient has had improvement in respiratory symptoms and well as endurance levels. Weight at time of discharge is at 117.7 kg and she has been educated on importance of daily weight monitoring.  She continues to require encouragement to use her CPAP consistently.   Coumadin was resumed on 03/29/13 by cardiology. H/H has been monitored closely and no recurrent bleeding noted. INR subtherapeutic and patient discharged on 10 mg daily with daily protimes to be drawn by [facility] [place] coumad[facility] to manage and adjust dose as indicated. Hgb has been variable but has shown  Improvement. Recheck CBC to be drawn at post hospital follow up visit.  Stool guaiac X 1 done this week has been negative. Po intake has been good. Diabetes has been monitored with ac/hs checks and has been poorly controlled on lantus. She was started on her home regimen of NPH with meal coverage changed to 15 units regular tid ac. She has been advised to check BS bid-tid basis and follow up with Dr. Horald Pollen for further titration. She has progressed to modified independent level and will continue to receive HHPT past discharge.    Rehab course: During patient's stay in rehab weekly team conferences were held to monitor patient's progress, set goals and discuss barriers to discharge. Patient has had improvement in activity tolerance, dynamic standing, energy conservation as well as safety awareness.  She is modified independent with use of rolling walker. She is able to ambulate. She is modified independent for ADL tasks with use of AE.    Disposition: 01-Home or Self Care  Diet: Diabetic diet. Low salt.   Special Instructions: 1. Check weights daily. 2. Check blood sugars before meals and at bedtime.  3. HHRN to draw protime 11/20.         Future Appointments Provider Department Dept Phone   04/06/2013 8:15 AM  [facility] [place] Office [phone]   04/07/2013 2:00 PM Kennon Rounds [facility] [place] Office [phone]   04/08/2013 9:30 AM Brynda Rim [facility] at  [facility] [phone]   07/16/2013 12:30 PM Ladene Artist, MD [facility] [phone]       Medication List    STOP taking these medications       allopurinol 100 MG tablet  Commonly known as:  ZYLOPRIM     aspirin EC 81 MG tablet     citalopram 20 MG tablet  Commonly known as:  CELEXA     levofloxacin 500 MG tablet  Commonly known as:  LEVAQUIN      TAKE these medications       albuterol 108 (90 BASE) MCG/ACT inhaler  Commonly known as:  PROVENTIL HFA;VENTOLIN HFA  Inhale 2 puffs into the lungs every 6 (six) hours as needed for wheezing or shortness of breath.     amLODipine 5 MG tablet  Commonly known as:  NORVASC  Take 1 tablet (5 mg total) by mouth daily.     Budesonide 90 MCG/ACT inhaler  Inhale 1  puff into the lungs 2 (two) times daily.     calcium-vitamin D 500-200 MG-UNIT per tablet  Commonly known as:  OSCAL WITH D  Take 1 tablet by mouth daily with breakfast.     carvedilol 6.25 MG tablet  Commonly known as:  COREG  Take 1 tablet (6.25 mg total) by mouth 2 (two) times daily with a meal.     enalapril 10 MG tablet  Commonly known as:  VASOTEC  Take 2 tablets (20 mg total) by mouth daily. Note increase in dose     fluticasone 50 MCG/ACT nasal spray  Commonly known as:  FLONASE  Place 2 sprays into both nostrils daily as needed for allergies or rhinitis. As needed for nasal stuffiness.     gabapentin 300 MG capsule  Commonly known as:  NEURONTIN  Take 1 capsule (300 mg total) by mouth 2 (two) times daily.     insulin NPH 100 UNIT/ML injection  Commonly known as:  HUMULIN N,NOVOLIN N  Inject 25-44 Units into the skin 2 (two) times daily before a meal. Take 44 units in the morning and 25 units in  the evening     insulin regular 100 units/mL injection  Commonly known as:  NOVOLIN R,HUMULIN R  Change to 15 units three times a with meals (to cover all meals--this is the same total amount but spread out)     KRILL OIL PO  Take 1 capsule by mouth daily. Mega Red     levothyroxine 25 MCG tablet  Commonly known as:  SYNTHROID, LEVOTHROID  Take 25 mcg by mouth daily before breakfast.     LORazepam 0.5 MG tablet  Commonly known as:  ATIVAN  Take 0.5 mg by mouth at bedtime.     multivitamin with minerals tablet  Take 1 tablet by mouth daily.     rosuvastatin 10 MG tablet  Commonly known as:  CRESTOR  Take 1 tablet (10 mg total) by mouth daily.     torsemide 20 MG tablet  Commonly known as:  DEMADEX  Take 1 tablet (20 mg total) by mouth 2 (two) times daily.     traMADol 50 MG tablet  Commonly known as:  ULTRAM  Take 1 tablet (50 mg total) by mouth 2 (two) times daily as needed for pain.     traZODone 50 MG tablet  Commonly known as:  DESYREL  Take 0.5-1 tablets (25-50 mg total) by mouth at bedtime as needed for sleep.     Vitamin D (Ergocalciferol) 50000 UNITS Caps capsule  Commonly known as:  DRISDOL  Take 50,000 Units by mouth every 7 (seven) days. On Tuesday     warfarin 5 MG tablet  Commonly known as:  COUMADIN  Take two pills daily in evening.       Follow-up Information   Call Ranelle Oyster, MD. (As needed)    Specialty:  Physical Medicine and Rehabilitation   Contact information:   [address], [address] [place] [address] [phone]       Follow up with Tereso Newcomer, PA-C On 04/07/2013. (Appointment at 2 pm)    Specialty:  Physician Assistant   Contact information:   [address] [place] [address] [phone]       Follow up with Danise Edge, MD On 04/08/2013. (@ 9:30 am (with Malva Cogan, PA)) --Recheck CBC.   Specialty:  Family Medicine   Contact information:   [address]  [place] [address] [phone]       Signed: Jacquelynn Cree 04/02/2013, 9:26 AM

## 2013-03-31 NOTE — Progress Notes (Signed)
Physical Therapy Session Note  Patient Details  Name: Destiny [name] MRN: [id number] Date of Birth: 11/03/1940  Today's Date: 03/31/2013 Time: 0830-0925 Time Calculation (min): 55 min  Short Term Goals: Week 1:  PT Short Term Goal 1 (Week 1): STG's=LTG's due to short ELOS.  Skilled Therapeutic Interventions/Progress Updates:    Session focused on grad day activities and finalizing d/c planning/education. Pt's husband present for session as well. Gait with RW on unit and in ADL apartment/pt room mod I for endurance and strengthening, stair negotiation with S up/down 5 steps for community mobility and general strengthening, bed mobility and transfers in ADL apartment mod I, simulated car transfer with RW (recommended backing into the seat vs. Stepping in and pt in agreement and return demonstrated mod I), and w/c mobility on unit household distances mod I. Overall pt with excellent session and feels prepared for d/c tomorrow. Rest breaks as needed due to fatigue/decreased activity tolerance throughout session.  Therapy Documentation Precautions:  Precautions Precautions: Fall Precaution Comments: monitor O2 saturation Restrictions Weight Bearing Restrictions: No Vital Signs: O2 Device: Nasal cannula O2 Flow Rate (L/min): 2 L/min Pain:  Denies pain.  Locomotion : Ambulation Ambulation/Gait Assistance: 6: Modified independent (Device/Increase time)   See FIM for current functional status  Therapy/Group: Individual Therapy  Karolee Stamps [facility] 03/31/2013, 9:26 AM

## 2013-04-01 LAB — GLUCOSE, CAPILLARY
Glucose-Capillary: 180 mg/dL — ABNORMAL HIGH (ref 70–99)
Glucose-Capillary: 218 mg/dL — ABNORMAL HIGH (ref 70–99)

## 2013-04-01 LAB — CBC
MCH: 30.4 pg (ref 26.0–34.0)
MCHC: 34.1 g/dL (ref 30.0–36.0)
Platelets: 199 10*3/uL (ref 150–400)
RDW: 15.5 % (ref 11.5–15.5)

## 2013-04-01 MED ORDER — BUDESONIDE 90 MCG/ACT IN AEPB: 1.0000 | INHALATION_SPRAY | Freq: Two times a day (BID) | RESPIRATORY_TRACT | Status: DC

## 2013-04-01 MED ORDER — WARFARIN SODIUM 10 MG PO TABS
10.0000 mg | ORAL_TABLET | Freq: Once | ORAL | Status: DC
  Filled 2013-04-01: qty 1

## 2013-04-01 MED ORDER — CARVEDILOL 6.25 MG PO TABS: 6.2500 mg | ORAL_TABLET | Freq: Two times a day (BID) | ORAL | Status: DC

## 2013-04-01 MED ORDER — TORSEMIDE 20 MG PO TABS: 20.0000 mg | ORAL_TABLET | Freq: Two times a day (BID) | ORAL | Status: DC

## 2013-04-01 MED ORDER — BUDESONIDE 0.25 MG/2ML IN SUSP: 0.2500 mg | Freq: Two times a day (BID) | RESPIRATORY_TRACT | Status: DC

## 2013-04-01 MED ORDER — ENALAPRIL MALEATE 10 MG PO TABS: 20.0000 mg | ORAL_TABLET | Freq: Every day | ORAL | Status: DC

## 2013-04-01 MED ORDER — INSULIN REGULAR HUMAN 100 UNIT/ML IJ SOLN: INTRAMUSCULAR | Status: DC

## 2013-04-01 MED ORDER — WARFARIN SODIUM 5 MG PO TABS: ORAL_TABLET | ORAL | Status: DC

## 2013-04-01 MED ORDER — FLUTICASONE PROPIONATE 50 MCG/ACT NA SUSP: 2.0000 | Freq: Every day | NASAL | Status: DC | PRN

## 2013-04-01 MED ORDER — TRAZODONE HCL 50 MG PO TABS: 25.0000 mg | ORAL_TABLET | Freq: Every evening | ORAL | Status: DC | PRN

## 2013-04-01 NOTE — Plan of Care (Signed)
Problem: RH KNOWLEDGE DEFICIT Goal: RH STG INCREASE KNOWLEDGE OF DIABETES Able to identify signs and symptoms of hyperglycemia/hypoglycemia. Able to identify medications and treatments for diabetes with minimal assistance.  Outcome: Completed/Met Date Met:  04/01/13 Patient and husband verbalized understanding of monitoring CBG and insulin administration at home, and follow up appointment with their diabetes MD. Goal: RH STG INCREASE KNOWLEDGE OF HYPERTENSION Outcome: Completed/Met Date Met:  04/01/13 Patient and husband verbalized understanding of blood pressure monitoring, medication management, and follow up appointment with primary MD.

## 2013-04-01 NOTE — Progress Notes (Signed)
Patient discharged to home with husband at 1214. o2 sat at 94% on 4 liters [place]. No complaint of pain . Patient and husband verbalized understanding of discharge instructions given and reviewed by P. Love, PA.   Destiny [name]

## 2013-04-01 NOTE — Patient Care Conference (Signed)
Inpatient RehabilitationTeam Conference and Plan of Care Update Date: 03/31/2013   Time: 2:55 PM    Patient Name: Destiny [name]      Medical Record Number: [id number]  Date of Birth: 1940/08/28 Sex: Female         Room/Bed: 4W12C/4W12C-01 Payor Info: Payor: MEDICARE / Plan: MEDICARE PART A AND B / Product Type: *No Product type* /    Admitting Diagnosis: Deconditioned,CHF  Admit Date/Time:  03/24/2013  5:29 PM Admission Comments: No comment available   Primary Diagnosis:  Physical deconditioning Principal Problem: Physical deconditioning  Patient Active Problem List   Diagnosis Date Noted  . Physical deconditioning 03/25/2013  . Epistaxis 03/25/2013  . Rectal bleeding--due to hems/rectal tube irritation 03/25/2013  . DNR (do not resuscitate) 03/24/2013  . Pulmonary edema 03/17/2013  . Hyperkalemia 03/12/2013  . Bronchitis, acute 03/07/2013  . CAP (community acquired pneumonia) 03/06/2013  . Hypothyroidism 03/06/2013  . Acute on chronic combined systolic and diastolic heart failure 03/06/2013  . Acute respiratory failure with hypoxia 03/05/2013  . Olecranon bursitis of right elbow 12/29/2012  . Vitamin D deficiency 06/09/2012  . CKD (chronic kidney disease) stage 3, GFR 30-59 ml/min 12/27/2011  . History of Cardiomyopathy, ischemic   . OSA (obstructive sleep apnea) 09/05/2010  . LEG PAIN, BILATERAL 12/28/2009  . DIABETES MELLITUS, WITH AUTONOMIC NEUROPATHY 10/28/2009  . ATRIAL FIBRILLATION, PAROXYSMAL 06/21/2009  . NON-HODGKIN'S LYMPHOMA 04/21/2009  . MURAL THROMBUS, LEFT VENTRICLE 03/07/2009  . LYMPHEDEMA 03/07/2009  . DIABETIC  RETINOPATHY 08/26/2008  . LBBB 08/26/2008  . CVA 08/26/2008  . BACK PAIN, CHRONIC 08/26/2008  . ICD - IN SITU 08/26/2008  . UTI'S, RECURRENT 06/24/2008  . CAROTID BRUIT 05/20/2007  . DYSLIPIDEMIA 03/19/2007  . HYPERTENSION 11/20/2006    Expected Discharge Date: Expected Discharge Date: 04/01/13  Team Members Present: Physician leading  conference: Dr. Faith Rogue Social Worker Present: Amada Jupiter, LCSW Nurse Present: Leland Johns, RN PT Present: Zerita Boers, PT OT Present: Mackie Pai, OT;Ardis Rowan, COTA SLP Present: Feliberto Gottron, SLP PPS Coordinator present : Tora Duck, RN, CRRN;Becky Henrene Dodge, PT     Current Status/Progress Goal Weekly Team Focus  Medical   deconditioning after chf, respiratory failure, volume balanced, using cpap  prepare medically for dc  see above   Bowel/Bladder   continent bowel and baldder . LBM 11-16   mod I  [place]    Swallow/Nutrition/ Hydration             ADL's             Mobility   Mod(I)-Supervision  Mod(I)  family training, functional endurance    Communication             Safety/Cognition/ Behavioral Observations            Pain   tylenol 650 mgpo prn or ultram 50mg  po for increased pain . occasional muscle rub to right hip   less 3  monitor pain medications required    Skin   bruising to extremities and abdomen   no new [place]   educate on skin care     Rehab Goals Patient on target to meet rehab goals: Yes *See Care Plan and progress notes for long and short-term goals.  Barriers to Discharge: none, equipment set up    Possible Resolutions to Barriers:  n/a    Discharge Planning/Teaching Needs:  home with husband who will provide 24/7 assistance      Team Discussion:  Ready for d/c.  Good  progress.  All family ed completed with spouse  Revisions to Treatment Plan:     Continued Need for Acute Rehabilitation Level of Care: The patient requires daily medical management by a physician with specialized training in physical medicine and rehabilitation for the following conditions: Daily direction of a multidisciplinary physical rehabilitation program to ensure safe treatment while eliciting the highest outcome that is of practical value to the patient.: Yes Daily medical management of patient stability for increased activity during  participation in an intensive rehabilitation regime.: Yes Daily analysis of laboratory values and/or radiology reports with any subsequent need for medication adjustment of medical intervention for : Cardiac problems;Pulmonary problems  Latravious Levitt 04/01/2013, 10:59 AM

## 2013-04-01 NOTE — Progress Notes (Addendum)
Subjective/Complaints: Slept well again. Excited to go home. .   A 12 point review of systems has been performed and if not noted above is otherwise negative.   Objective: Vital Signs: Blood pressure 142/62, pulse 75, temperature 97.8 F (36.6 C), temperature source Oral, resp. rate 18, height 5\' 4"  (1.626 m), weight 116.5 kg (256 lb 13.4 oz), SpO2 96.00%. No results found.  Recent Labs  03/30/13 0500  WBC 5.0  HGB 9.8*  HCT 29.6*  PLT 164    Recent Labs  03/30/13 0500  NA 137  K 4.1  CL 97  GLUCOSE 256*  BUN 35*  CREATININE 1.17*  CALCIUM 8.3*   CBG (last 3)   Recent Labs  03/31/13 1719 03/31/13 2028 04/01/13 0738  GLUCAP 244* 195* 180*    Wt Readings from Last 3 Encounters:  04/01/13 116.5 kg (256 lb 13.4 oz)  03/20/13 122.9 kg (270 lb 15.1 oz)  01/15/13 123.741 kg (272 lb 12.8 oz)    Physical Exam:  Constitutional: She is oriented to person, place, and time. She appears well-developed and well-nourished.  Obese. Appears comfortable HENT:  Head: Normocephalic and atraumatic.  Right Ear: External ear normal.  Left Ear: External ear normal.  Eyes: Conjunctivae and EOM are normal. Pupils are equal, round, and reactive to light.  Neck: No JVD present. No tracheal deviation present. No thyromegaly present.  Cardiovascular: Normal rate and intact distal pulses. Reg rhythm Respiratory: Effort normal and breath sounds normal. No respiratory distress. She has a few wheezes and rhonchi today. Oxygen via [place] GI: She exhibits no distension. There is no tenderness.  Musculoskeletal: She exhibits edema (trace in legs).  Lymphadenopathy:  She has no cervical adenopathy.  Neurological: She is alert and oriented to person, place, and time. She has normal reflexes. No cranial nerve deficit. Coordination normal.  Pt appropriate. Good insight and awareness. Sensation intact in all 4s except for mild decrease in distal LE's/feet. UE strength 4-4+/5. LE 3/5 HF to 4/5 at  ankles. CN non-focal.  Psychiatric: She has a normal mood and affect. Her behavior is normal. Judgment and thought content normal.     Assessment/Plan: 1. Functional deficits secondary to deconditioning after acute on chronic respiratory failure which require 3+ hours per day of interdisciplinary therapy in a comprehensive inpatient rehab setting. Physiatrist is providing close team supervision and 24 hour management of active medical problems listed below. Physiatrist and rehab team continue to assess barriers to discharge/monitor patient progress toward functional and medical goals. FIM: FIM - Bathing Bathing Steps Patient Completed: Chest;Right Arm;Left Arm;Abdomen;Front perineal area;Buttocks;Right lower leg (including foot);Left lower leg (including foot);Right upper leg;Left upper leg Bathing: 6: More than reasonable amount of time  FIM - Upper Body Dressing/Undressing Upper body dressing/undressing steps patient completed: Thread/unthread right sleeve of pullover shirt/dresss;Thread/unthread left sleeve of pullover shirt/dress;Put head through opening of pull over shirt/dress;Pull shirt over trunk Upper body dressing/undressing: 6: More than reasonable amount of time FIM - Lower Body Dressing/Undressing Lower body dressing/undressing steps patient completed: Thread/unthread left underwear leg;Thread/unthread right underwear leg;Pull underwear up/down;Thread/unthread right pants leg;Thread/unthread left pants leg;Pull pants up/down;Don/Doff right shoe;Don/Doff left shoe Lower body dressing/undressing: 6: More than reasonable amount of time  FIM - Toileting Toileting steps completed by patient: Adjust clothing prior to toileting;Performs perineal hygiene;Adjust clothing after toileting Toileting Assistive Devices: Grab bar or rail for support Toileting: 6: More than reasonable amount of time  FIM - Diplomatic Services operational officer Devices: Grab bars;Walker Toilet  Transfers: 5-To toilet/BSC: Supervision (  verbal cues/safety issues);5-From toilet/BSC: Supervision (verbal cues/safety issues)  FIM - Banker Devices: Therapist, occupational: 6: Assistive device: no helper  FIM - Locomotion: Wheelchair Distance: 100 Locomotion: Wheelchair: 5: Travels 50 - 149 ft, turns around, maneuvers to table, bed or toilet, negotiates 3% grade: modified independent FIM - Locomotion: Ambulation Locomotion: Ambulation Assistive Devices: Designer, industrial/product Ambulation/Gait Assistance: 6: Modified independent (Device/Increase time) Locomotion: Ambulation: 5: Household Independent - travels 50 - 149 ft independent or modified independent  Comprehension Comprehension Mode: Auditory Comprehension: 5-Understands complex 90% of the time/Cues < 10% of the time  Expression Expression Mode: Verbal Expression: 5-Expresses complex 90% of the time/cues < 10% of the time  Social Interaction Social Interaction: 6-Interacts appropriately with others with medication or extra time (anti-anxiety, antidepressant).  Problem Solving Problem Solving: 5-Solves basic problems: With no assist  Memory Memory: 6-More than reasonable amt of time  Medical Problem List and Plan:  Deconditioning due to acute on chronic respiratory failure due to fluid overload in setting of OHS/OSA.  1. DVT Prophylaxis/Anticoagulation: Pharmaceutical: Sq lovenox ---> coumadin--will not crosscover at home 2. Chronic back pain/ Pain Management: continue Neurontin bid with tylenol prn.  3. Mood: generally appropriate.  4. Neuropsych: This patient is capable of making decisions on her own behalf.   -added scheduled xanax at night which helped 5. Leucocytosis: reactive and slowly resolving. Will monitor for fevers and other signs of infection.  6 Rectal bleeding due to Hems/irritation from rectal tube: follow hgb and symptoms.  -hemorrhoidal cream.  -hgb drifting down a  bit. Will need follow up as an outpt. Stool heme neg 7. Acute on Chronic combined systolic/diastolic CHF/ Afib:        - Demadex, Coreg and Vasotec, per cards---outpt follow up needed  -ACE increased by cards   -continue no added salt to diet  -coumadin resumed  -pt maintaining sats when up with therapy. Stamina improving  -follow up with cards early next week 8. Morbid obesity with OHS/OSA:  CPAP qhs. .  9. Acute on chronic respiratory failure: CPAP when asleep-used last night  -Pulmicort and Proventil inhalers for SOB. 2 L oxygen at all times.   -saline spray for nose, humidified o2  -flonase  -improved 10. 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 Continue Lantus 30 units daily with meal coverage and SSI for Elevated BS  -regimen changed to resemble home regimen--will need further titration once home   LOS (Days) 8 A FACE TO FACE EVALUATION WAS PERFORMED  Destiny [name] T 04/01/2013 8:15 AM

## 2013-04-01 NOTE — Progress Notes (Signed)
Social Work [name], female   DOB: Sep 23, 1940, 72 y.o.   MRN: [id number]   Destiny Jupiter, LCSW Social Worker Signed  Patient Care Conference Service date: 04/01/2013 10:57 AM  Inpatient RehabilitationTeam Conference and Plan of Care Update Date: 03/31/2013   Time: 2:55 PM     Patient Name: Destiny [name]       Medical Record Number: [id number]   Date of Birth: 28-Jul-1940 Sex: Female         Room/Bed: 4W12C/4W12C-01 Payor Info: Payor: MEDICARE / Plan: MEDICARE PART A AND B / Product Type: *No Product type* /   Admitting Diagnosis: Deconditioned,CHF   Admit Date/Time:  03/24/2013  5:29 PM Admission Comments: No comment available   Primary Diagnosis:  Physical deconditioning Principal Problem: Physical deconditioning    Patient Active Problem List     Diagnosis  Date Noted   .  Physical deconditioning  03/25/2013   .  Epistaxis  03/25/2013   .  Rectal bleeding--due to hems/rectal tube irritation  03/25/2013   .  DNR (do not resuscitate)  03/24/2013   .  Pulmonary edema  03/17/2013   .  Hyperkalemia  03/12/2013   .  Bronchitis, acute  03/07/2013   .  CAP (community acquired pneumonia)  03/06/2013   .  Hypothyroidism  03/06/2013   .  Acute on chronic combined systolic and diastolic heart failure  03/06/2013   .  Acute respiratory failure with hypoxia  03/05/2013   .  Olecranon bursitis of right elbow  12/29/2012   .  Vitamin D deficiency  06/09/2012   .  CKD (chronic kidney disease) stage 3, GFR 30-59 ml/min  12/27/2011   .  History of Cardiomyopathy, ischemic     .  OSA (obstructive sleep apnea)  09/05/2010   .  LEG PAIN, BILATERAL  12/28/2009   .  DIABETES MELLITUS, WITH AUTONOMIC NEUROPATHY  10/28/2009   .  ATRIAL FIBRILLATION, PAROXYSMAL  06/21/2009   .  NON-HODGKIN'S LYMPHOMA  04/21/2009   .  MURAL THROMBUS, LEFT VENTRICLE  03/07/2009   .  LYMPHEDEMA  03/07/2009   .  DIABETIC  RETINOPATHY  08/26/2008   .  LBBB  08/26/2008   .  CVA  08/26/2008   .  BACK  PAIN, CHRONIC  08/26/2008   .  ICD - IN SITU  08/26/2008   .  UTI'S, RECURRENT  06/24/2008   .  CAROTID BRUIT  05/20/2007   .  DYSLIPIDEMIA  03/19/2007   .  HYPERTENSION  11/20/2006     Expected Discharge Date: Expected Discharge Date: 04/01/13  Team Members Present: Physician leading conference: Dr. Faith Rogue Social Worker Present: Destiny Jupiter, LCSW Nurse Present: Leland Johns, RN PT Present: Zerita Boers, PT OT Present: Mackie Pai, OT;Ardis Rowan, COTA SLP Present: Feliberto Gottron, SLP PPS Coordinator present : Tora Duck, RN, CRRN;Becky Henrene Dodge, PT        Current Status/Progress  Goal  Weekly Team Focus   Medical     deconditioning after chf, respiratory failure, volume balanced, using cpap  prepare medically for dc  see above   Bowel/Bladder     continent bowel and baldder . LBM 11-16   mod I  [place]    Swallow/Nutrition/ Hydration            ADL's            Mobility     Mod(I)-Supervision  Mod(I)  family training, functional endurance    Communication  Safety/Cognition/ Behavioral Observations           Pain     tylenol 650 mgpo prn or ultram 50mg  po for increased pain . occasional muscle rub to right hip   less 3  monitor pain medications required    Skin     bruising to extremities and abdomen   no new [place]   educate on skin care     Rehab Goals Patient on target to meet rehab goals: Yes *See Care Plan and progress notes for long and short-term goals.    Barriers to Discharge:  none, equipment set up      Possible Resolutions to Barriers:    n/a     Discharge Planning/Teaching Needs:    home with husband who will provide 24/7 assistance      Team Discussion:    Ready for d/c.  Good progress.  All family ed completed with spouse   Revisions to Treatment Plan:        Continued Need for Acute Rehabilitation Level of Care: The patient requires daily medical management by a physician with specialized training in physical  medicine and rehabilitation for the following conditions: Daily direction of a multidisciplinary physical rehabilitation program to ensure safe treatment while eliciting the highest outcome that is of practical value to the patient.: Yes Daily medical management of patient stability for increased activity during participation in an intensive rehabilitation regime.: Yes Daily analysis of laboratory values and/or radiology reports with any subsequent need for medication adjustment of medical intervention for : Cardiac problems;Pulmonary problems  Michelle Vanhise 04/01/2013, 10:59 AM

## 2013-04-01 NOTE — Progress Notes (Signed)
ANTICOAGULATION CONSULT NOTE - Follow Up Consult  Pharmacy Consult for coumadin Indication: atrial fibrillation  Allergies  Allergen Reactions  . Sulfonamide Derivatives Other (See Comments)    Unknown; "childhood allergy/mother"    Patient Measurements: Height: 5\' 4"  (162.6 cm) Weight: 256 lb 13.4 oz (116.5 kg) IBW/kg (Calculated) : 54.7 Heparin Dosing Weight:   Vital Signs: Temp: 97.8 F (36.6 C) (11/19 0609) Temp src: Oral (11/19 0609) BP: 142/62 mmHg (11/19 0609) Pulse Rate: 75 (11/19 0609)  Labs:  Recent Labs  03/30/13 0500 03/30/13 0845 03/31/13 0640 04/01/13 0550  HGB 9.8*  --   --   --   HCT 29.6*  --   --   --   PLT 164  --   --   --   LABPROT  --  11.2* 12.9 12.0  INR  --  0.82 0.99 0.90  CREATININE 1.17*  --   --   --     Estimated Creatinine Clearance: 56.1 ml/min (by C-G formula based on Cr of 1.17).   Medications:  Scheduled:  . ALPRAZolam  0.5 mg Oral QHS  . amLODipine  5 mg Oral Daily  . budesonide (PULMICORT) nebulizer solution  0.25 mg Nebulization BID  . carvedilol  6.25 mg Oral BID WC  . enalapril  20 mg Oral Daily  . enoxaparin (LOVENOX) injection  40 mg Subcutaneous Q24H  . feeding supplement (GLUCERNA SHAKE)  237 mL Oral Q supper  . fluticasone  2 spray Each Nare Daily  . gabapentin  200 mg Oral BID  . insulin aspart  0-15 Units Subcutaneous TID WC  . insulin aspart  0-5 Units Subcutaneous QHS  . insulin aspart  10 Units Subcutaneous TID WC  . insulin NPH  20 Units Subcutaneous QHS  . insulin NPH  44 Units Subcutaneous QAC breakfast  . levothyroxine  25 mcg Oral QAC breakfast  . MUSCLE RUB   Topical QID  . sodium chloride  2 spray Each Nare QID  . torsemide  20 mg Oral BID  . Warfarin - Pharmacist Dosing Inpatient   Does not apply q1800   Infusions:    Assessment: 75 female with afib is currently on subtherapeutic coumadin.  INR today didn't move at 0.9 even after 2 doses of coumadin 7.5mg .  Patient was on coumadin 7.5mg  daily  except 5mg  MWF prior to admission with an INR of 1.97 on admission. Goal of Therapy:  INR 2-3 Monitor platelets by anticoagulation protocol: Yes   Plan:  1) Coumadin 10mg  po x1 2) INR in am  Elizabeth Paulsen, Tsz-Yin 04/01/2013,8:38 AM

## 2013-04-02 ENCOUNTER — Telehealth: Payer: Self-pay | Admitting: Internal Medicine

## 2013-04-02 ENCOUNTER — Ambulatory Visit (INDEPENDENT_AMBULATORY_CARE_PROVIDER_SITE_OTHER): Payer: Medicare Other | Admitting: Internal Medicine

## 2013-04-02 DIAGNOSIS — Z7901 Long term (current) use of anticoagulants: Secondary | ICD-10-CM | POA: Diagnosis not present

## 2013-04-02 DIAGNOSIS — I2510 Atherosclerotic heart disease of native coronary artery without angina pectoris: Secondary | ICD-10-CM | POA: Diagnosis not present

## 2013-04-02 DIAGNOSIS — I129 Hypertensive chronic kidney disease with stage 1 through stage 4 chronic kidney disease, or unspecified chronic kidney disease: Secondary | ICD-10-CM | POA: Diagnosis not present

## 2013-04-02 DIAGNOSIS — I509 Heart failure, unspecified: Secondary | ICD-10-CM | POA: Diagnosis not present

## 2013-04-02 DIAGNOSIS — N189 Chronic kidney disease, unspecified: Secondary | ICD-10-CM | POA: Diagnosis not present

## 2013-04-02 DIAGNOSIS — Z5181 Encounter for therapeutic drug level monitoring: Secondary | ICD-10-CM | POA: Diagnosis not present

## 2013-04-02 DIAGNOSIS — Z794 Long term (current) use of insulin: Secondary | ICD-10-CM | POA: Diagnosis not present

## 2013-04-02 DIAGNOSIS — E119 Type 2 diabetes mellitus without complications: Secondary | ICD-10-CM | POA: Diagnosis not present

## 2013-04-02 DIAGNOSIS — I6350 Cerebral infarction due to unspecified occlusion or stenosis of unspecified cerebral artery: Secondary | ICD-10-CM

## 2013-04-02 DIAGNOSIS — I238 Other current complications following acute myocardial infarction: Secondary | ICD-10-CM

## 2013-04-02 DIAGNOSIS — I4891 Unspecified atrial fibrillation: Secondary | ICD-10-CM

## 2013-04-02 DIAGNOSIS — I5043 Acute on chronic combined systolic (congestive) and diastolic (congestive) heart failure: Secondary | ICD-10-CM | POA: Diagnosis not present

## 2013-04-02 LAB — POCT INR: INR: 1

## 2013-04-02 NOTE — Telephone Encounter (Signed)
New problem    Please call back today RN would like to give the PT INR  Results.

## 2013-04-02 NOTE — Telephone Encounter (Signed)
Returned call to [facility] see anticoagulation note in EPIC.

## 2013-04-03 NOTE — Progress Notes (Signed)
Social Work  Discharge Note  The overall goal for the admission was met for:   Discharge location: Yes - home with husband who can provide 24/7 assistance  Length of Stay: Yes - 8 days  Discharge activity level: Yes - modified independent  Home/community participation: Yes  Services provided included: MD, RD, PT, OT, RN, TR, Pharmacy and SW  Financial Services: Medicare and Private Insurance: [employer]  Follow-up services arranged: Home Health: RN, PT via [facility] and Patient/Family has no preference for HH/DME agencies  Comments (or additional information):  Patient/Family verbalized understanding of follow-up arrangements: Yes  Individual responsible for coordination of the follow-up plan: patient  Confirmed correct DME delivered: NA    Shean Gerding

## 2013-04-06 ENCOUNTER — Encounter: Payer: Self-pay | Admitting: Internal Medicine

## 2013-04-06 ENCOUNTER — Ambulatory Visit (INDEPENDENT_AMBULATORY_CARE_PROVIDER_SITE_OTHER): Payer: Medicare Other | Admitting: *Deleted

## 2013-04-06 ENCOUNTER — Ambulatory Visit (INDEPENDENT_AMBULATORY_CARE_PROVIDER_SITE_OTHER): Payer: Medicare Other | Admitting: Cardiovascular Disease

## 2013-04-06 DIAGNOSIS — I4891 Unspecified atrial fibrillation: Secondary | ICD-10-CM | POA: Diagnosis not present

## 2013-04-06 DIAGNOSIS — E119 Type 2 diabetes mellitus without complications: Secondary | ICD-10-CM | POA: Diagnosis not present

## 2013-04-06 DIAGNOSIS — I5043 Acute on chronic combined systolic (congestive) and diastolic (congestive) heart failure: Secondary | ICD-10-CM | POA: Diagnosis not present

## 2013-04-06 DIAGNOSIS — I2589 Other forms of chronic ischemic heart disease: Secondary | ICD-10-CM | POA: Diagnosis not present

## 2013-04-06 DIAGNOSIS — I509 Heart failure, unspecified: Secondary | ICD-10-CM | POA: Diagnosis not present

## 2013-04-06 DIAGNOSIS — I6350 Cerebral infarction due to unspecified occlusion or stenosis of unspecified cerebral artery: Secondary | ICD-10-CM

## 2013-04-06 DIAGNOSIS — I2510 Atherosclerotic heart disease of native coronary artery without angina pectoris: Secondary | ICD-10-CM | POA: Diagnosis not present

## 2013-04-06 DIAGNOSIS — N189 Chronic kidney disease, unspecified: Secondary | ICD-10-CM | POA: Diagnosis not present

## 2013-04-06 DIAGNOSIS — I238 Other current complications following acute myocardial infarction: Secondary | ICD-10-CM

## 2013-04-06 DIAGNOSIS — I255 Ischemic cardiomyopathy: Secondary | ICD-10-CM

## 2013-04-06 DIAGNOSIS — I129 Hypertensive chronic kidney disease with stage 1 through stage 4 chronic kidney disease, or unspecified chronic kidney disease: Secondary | ICD-10-CM | POA: Diagnosis not present

## 2013-04-07 ENCOUNTER — Ambulatory Visit (INDEPENDENT_AMBULATORY_CARE_PROVIDER_SITE_OTHER): Payer: Medicare Other | Admitting: Physician Assistant

## 2013-04-07 ENCOUNTER — Encounter: Payer: Self-pay | Admitting: Physician Assistant

## 2013-04-07 VITALS — BP 132/60 | HR 82 | Ht 64.0 in | Wt 271.0 lb

## 2013-04-07 DIAGNOSIS — I5042 Chronic combined systolic (congestive) and diastolic (congestive) heart failure: Secondary | ICD-10-CM

## 2013-04-07 DIAGNOSIS — I2589 Other forms of chronic ischemic heart disease: Secondary | ICD-10-CM | POA: Diagnosis not present

## 2013-04-07 DIAGNOSIS — I2510 Atherosclerotic heart disease of native coronary artery without angina pectoris: Secondary | ICD-10-CM

## 2013-04-07 DIAGNOSIS — I10 Essential (primary) hypertension: Secondary | ICD-10-CM

## 2013-04-07 DIAGNOSIS — I6529 Occlusion and stenosis of unspecified carotid artery: Secondary | ICD-10-CM

## 2013-04-07 DIAGNOSIS — N189 Chronic kidney disease, unspecified: Secondary | ICD-10-CM | POA: Diagnosis not present

## 2013-04-07 DIAGNOSIS — D649 Anemia, unspecified: Secondary | ICD-10-CM | POA: Diagnosis not present

## 2013-04-07 DIAGNOSIS — Z95810 Presence of automatic (implantable) cardiac defibrillator: Secondary | ICD-10-CM

## 2013-04-07 DIAGNOSIS — E785 Hyperlipidemia, unspecified: Secondary | ICD-10-CM

## 2013-04-07 LAB — MDC_IDC_ENUM_SESS_TYPE_REMOTE
Battery Remaining Longevity: 47 mo
Battery Remaining Percentage: 61 %
HighPow Impedance: 48 Ohm
Implantable Pulse Generator Serial Number: 631685
Lead Channel Impedance Value: 280 Ohm
Lead Channel Impedance Value: 310 Ohm
Lead Channel Impedance Value: 380 Ohm
Lead Channel Pacing Threshold Amplitude: 0.625 V
Lead Channel Pacing Threshold Amplitude: 0.75 V
Lead Channel Pacing Threshold Amplitude: 1 V
Lead Channel Pacing Threshold Pulse Width: 0.6 ms
Lead Channel Sensing Intrinsic Amplitude: 3.2 mV
Lead Channel Setting Pacing Pulse Width: 0.5 ms
Lead Channel Setting Pacing Pulse Width: 0.6 ms
Zone Setting Detection Interval: 250 ms
Zone Setting Detection Interval: 310 ms

## 2013-04-07 LAB — BASIC METABOLIC PANEL
CO2: 36 mEq/L — ABNORMAL HIGH (ref 19–32)
Calcium: 10.1 mg/dL (ref 8.4–10.5)
Chloride: 94 mEq/L — ABNORMAL LOW (ref 96–112)
GFR: 31.96 mL/min — ABNORMAL LOW (ref >60.00)
Potassium: 4.1 mEq/L (ref 3.5–5.1)
Sodium: 137 mEq/L (ref 135–145)

## 2013-04-07 LAB — CBC WITH DIFFERENTIAL/PLATELET
Basophils Relative: 0.4 % (ref 0.0–3.0)
Eosinophils Absolute: 0.3 10*3/uL (ref 0.0–0.7)
Eosinophils Relative: 4.6 % (ref 0.0–5.0)
Hemoglobin: 11.5 g/dL — ABNORMAL LOW (ref 12.0–15.0)
Lymphs Abs: 2.1 10*3/uL (ref 0.7–4.0)
MCHC: 32.8 g/dL (ref 30.0–36.0)
MCV: 90.5 fl (ref 78.0–100.0)
Monocytes Absolute: 0.9 10*3/uL (ref 0.1–1.0)
Neutro Abs: 3.6 10*3/uL (ref 1.4–7.7)
RBC: 3.86 Mil/uL — ABNORMAL LOW (ref 3.87–5.11)

## 2013-04-07 MED ORDER — CARVEDILOL 6.25 MG PO TABS: 9.3750 mg | ORAL_TABLET | Freq: Two times a day (BID) | ORAL | Status: DC

## 2013-04-07 NOTE — Progress Notes (Signed)
[address] [place], [place]  [address] Phone: [phone] Fax:  [phone]  Date:  04/07/2013   ID:  Destiny [name], DOB Feb 08, 1941, MRN [id number]  PCP:  Danise Edge, MD  Cardiologist:  Dr. Lewayne Bunting     History of Present Illness: Destiny [name] is a 73 y.o. female with a history of CAD, status post CABG in 2003, ischemic cardiomyopathy with variable ejection fractions noted in the past (echo in 12/2011 demonstrated normal LV function with an EF 60-65%), status post CRT-D, chronic combined systolic and diastolic CHF, paroxysmal atrial fibrillation, prior LV mural thrombus, chronic Coumadin therapy, CKD, T2DM. She has a prior admission to the hospital for acute renal failure in the setting of rhabdomyolysis (while taking colchicine and fenofibrate).  Carotid US (09/2011): Bilateral ICA stenosis 40-59%.    She was admitted 10/23-11/11 with a/c combined systolic and diastolic CHF. Echocardiogram demonstrated worsening LV function with an EF of 35-40%. She was diuresed and cardiology initially recommended proceeding with an outpatient Myoview to rule out ischemia as it had been 11 years since her bypass surgery. She was treated with antibiotics for left lower extremity cellulitis. Her hospital course was complicated by a/c renal failure the setting of diuresis. She ultimately developed fever and hypoxia progressing to ventilator dependent respiratory failure. She was treated with IV antibiotics for HCAP. She was made a DO NOT RESUSCITATE/DO NOT INTUBATE. She did have symptoms of epistaxis and was seen by cardiology again. Our service made recommendations regarding anticoagulation going forward. She was discharged to inpatient rehabilitation x 1 week. Cardiology continued to follow her in rehab and had to adjust diuretics for a/c chronic heart failure.  Since d/c, she is doing well.  Weights at home have been stable without change.  She rides a scooter most of the time due to DJD.   She walks with a walker.  She gets tired easily.  Breathing is overall improved.  She sleeps on an incline chronically.  She denies PND.  She notes R>L LE edema.  This is typical for her.  It may be somewhat worse.  No syncope.  She has a dry cough.  This is improved.    Recent Labs: 09/29/2012: Direct LDL 67.6; HDL 38.30*  03/05/2013: TSH 1.630  03/21/2013: Pro B Natriuretic peptide (BNP) 6000.0*  03/25/2013: ALT 43*  03/30/2013: Creatinine 1.17*; Potassium 4.1  04/01/2013: Hemoglobin 10.9*   Wt Readings from Last 3 Encounters:  04/07/13 271 lb (122.925 kg)  04/01/13 256 lb 13.4 oz (116.5 kg)  03/20/13 270 lb 15.1 oz (122.9 kg)     Past Medical History  Diagnosis Date  . LBBB (left bundle branch block)   . Carotid bruit     a. 10/19/2011 carotid duplex - Mild hard plaque bilaterally. Stable 40-59% bilateral ICA stenosis.   Marland Kitchen Dyslipidemia   . Hypertension   . Chronic combined systolic and diastolic CHF (congestive heart failure)     a. EF as low as 25% in 2006;  b. EF 60-65% in 12/2011;  c. 02/2013 Echo: EF 35-40, mod mid-dist antsept HK, Gr 2 DD, mild LVH.  . Back pain, chronic     "just when I walk; mass on 3rd and 4th vertebrae right lower back"  . Cardiomyopathy, ischemic     a. 2012 s/p SJM 3231-40 Uni BiV ICD, ser # [id number].  Marland Kitchen CAD (coronary artery disease)     a. 05/2001 CABG x4: LIMA->LAD, VG->D1, VG->D2, VG->RCA.  Marland Kitchen Retinopathy due  to secondary diabetes     type II, uncontrolled  . CKD (chronic kidney disease), stage III   . Biventricular implantable cardiac defibrillator in situ 2007, 2012    a. 2007;  b. 2012 Gen change: SJM 3231-40 Uni BiV ICD, ser # [id number].  Marland Kitchen Hypoxemia requiring supplemental oxygen   . Candidiasis of vulva and vagina   . Hypothyroidism   . 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 Truett Perna, in remission  . History of pneumonia 12/27/11    "3 times; it's been a long time ago"  . OSA on CPAP   . Gout ~ 08/2011    . PAF (paroxysmal atrial fibrillation)     on Coumadin  . DM (diabetes mellitus), type 2 with complications     insulin dependent, retinopathy, neuropathy  . Mural thrombus of left ventricle     before 2003, while on Coumadin, No h/o CVA  . Morbid obesity with BMI of 45.0-49.9, adult     Ht. 5'4". BMI 47.2  . Vitamin D deficiency 06/09/2012    historical  . Olecranon bursitis of right elbow 12/29/2012  . History of recurrent UTIs     Current Outpatient Prescriptions  Medication Sig Dispense Refill  . albuterol (PROVENTIL HFA;VENTOLIN HFA) 108 (90 BASE) MCG/ACT inhaler Inhale 2 puffs into the lungs every 6 (six) hours as needed for wheezing or shortness of breath.  1 Inhaler  1  . amLODipine (NORVASC) 5 MG tablet Take 1 tablet (5 mg total) by mouth daily.  30 tablet  3  . calcium-vitamin D (OSCAL WITH D) 500-200 MG-UNIT per tablet Take 1 tablet by mouth daily with breakfast.      . carvedilol (COREG) 6.25 MG tablet Take 1 tablet (6.25 mg total) by mouth 2 (two) times daily with a meal.  60 tablet  1  . enalapril (VASOTEC) 10 MG tablet Take 2 tablets (20 mg total) by mouth daily. Note increase in dose  30 tablet  3  . gabapentin (NEURONTIN) 300 MG capsule Take 1 capsule (300 mg total) by mouth 2 (two) times daily.  60 capsule  2  . insulin NPH (HUMULIN N,NOVOLIN N) 100 UNIT/ML injection Inject 25-44 Units into the skin 2 (two) times daily before a meal. Take 44 units in the morning and 25 units in the evening      . insulin regular (NOVOLIN R,HUMULIN R) 100 units/mL injection Change to 15 units three times a with meals (to cover all meals--this is the same total amount but spread out)  10 mL  12  . KRILL OIL PO Take 1 capsule by mouth daily. Mega Red      . levothyroxine (SYNTHROID, LEVOTHROID) 25 MCG tablet Take 25 mcg by mouth daily before breakfast.      . LORazepam (ATIVAN) 0.5 MG tablet Take 0.5 mg by mouth at bedtime.      . Multiple Vitamins-Minerals (MULTIVITAMIN WITH MINERALS)  tablet Take 1 tablet by mouth daily.        . rosuvastatin (CRESTOR) 10 MG tablet Take 1 tablet (10 mg total) by mouth daily.  30 tablet  6  . torsemide (DEMADEX) 20 MG tablet Take 1 tablet (20 mg total) by mouth 2 (two) times daily.  120 tablet  2  . traMADol (ULTRAM) 50 MG tablet Take 1 tablet (50 mg total) by mouth 2 (two) times daily as needed for pain.  60 tablet  1  . Vitamin D, Ergocalciferol, (DRISDOL) 50000  UNITS CAPS capsule Take 50,000 Units by mouth every 7 (seven) days. On Tuesday      . warfarin (COUMADIN) 5 MG tablet Take two pills daily in evening.  60 tablet  1  . Budesonide 90 MCG/ACT inhaler Inhale 1 puff into the lungs 2 (two) times daily.  1 Inhaler  1  . fluticasone (FLONASE) 50 MCG/ACT nasal spray Place 2 sprays into both nostrils daily as needed for allergies or rhinitis. As needed for nasal stuffiness.    2  . traZODone (DESYREL) 50 MG tablet Take 0.5-1 tablets (25-50 mg total) by mouth at bedtime as needed for sleep.  30 tablet  0   No current facility-administered medications for this visit.    Allergies:   Sulfonamide derivatives   Social History:  The patient  reports that she has never smoked. She has never used smokeless tobacco. She reports that she does not drink alcohol or use illicit drugs.   Family History:  The patient's family history includes Asthma in her mother; Heart disease in her father; Kidney cancer in her mother; Stroke in her father.   ROS:  Please see the history of present illness.   All other systems reviewed and negative.   PHYSICAL EXAM: VS:  BP 132/60  Pulse 82  Ht 5\' 4"  (1.626 m)  Wt 271 lb (122.925 kg)  BMI 46.49 kg/m2 Well nourished, well developed, in no acute distress HEENT: normal Neck: no JVDat 90 Cardiac:  normal S1, S2; RRR; no murmur Lungs:  Decreased breath sounds bilaterally, no wheezing, rhonchi or rales Abd: soft, nontender, no hepatomegaly Ext: 1+ bilateral LE edema, R>L Skin: warm and dry Neuro:  CNs 2-12  intact, no focal abnormalities noted  EKG:  V paced, HR 82     ASSESSMENT AND PLAN:  1. Chronic Combined Systolic and Diastolic CHF: Volume appears stable since discharge. Her right greater than left LE edema is slightly increased. I have asked her to monitor this as well as her weight. If her weight increases, she can take an extra 10 mg of torsemide. Check a followup basic metabolic panel today.  Adjust carvedilol to 9.375 mg twice a day. 2. CAD: No angina. Her ejection fraction is lower than previous. Initially, recommendation was to proceed with an outpatient stress test. However, the patient became critically ill and remained on the ventilator for some time. She has just returned home and remains weak. She is slowly improving.  At this point, I have recommended that we hold off on proceeding with stress testing for now. We can certainly consider this in follow up. She is off of aspirin due to significant epistaxis in the hospital. Continue Coumadin. Continue statin. 3. Atrial Fibrillation: Maintaining sinus rhythm. Continue Coumadin. She did have significant epistaxis in the hospital as well as anemia. Check a followup CBC today. 4. Carotid Stenosis: Arrange followup carotid US. 5. Hypertension: Controlled. 6. Hyperlipidemia: Continue statin. 7. Chronic Kidney Disease: Arrange followup basic metabolic panel. 8. Status post CRT-D: Follow up with Dr. Ladona Ridgel as planned. 9. Disposition: I will have the patient establish with Dr. Shirlee Latch for continued management of her heart failure. I reviewed this with Dr. Ladona Ridgel today who agreed.  Signed, Tereso Newcomer, PA-C  04/07/2013 2:19 PM

## 2013-04-07 NOTE — Patient Instructions (Signed)
INCREASE COREG TO 9.375 MG TWICE DAILY (TAKE 1 AND 1/2 TABS OF THE 6.25 MG TWICE DAILY)  PLEASE SCHEDULE TO HAVE CAROTID DOPPLERS; DX 433.10  PLEASE FOLLOW UP WITH DR. Shirlee Latch IN 1 MONTH IN THE [facility]ART FAILURE CLINIC  LABS TODAY; BMET, CBC W/DIFF

## 2013-04-08 ENCOUNTER — Encounter: Payer: Self-pay | Admitting: Physician Assistant

## 2013-04-08 ENCOUNTER — Telehealth: Payer: Self-pay | Admitting: *Deleted

## 2013-04-08 ENCOUNTER — Ambulatory Visit (INDEPENDENT_AMBULATORY_CARE_PROVIDER_SITE_OTHER): Payer: Medicare Other | Admitting: Physician Assistant

## 2013-04-08 VITALS — BP 120/62 | HR 74 | Temp 98.3°F | Resp 16 | Ht 64.0 in | Wt 247.5 lb

## 2013-04-08 DIAGNOSIS — I5042 Chronic combined systolic (congestive) and diastolic (congestive) heart failure: Secondary | ICD-10-CM

## 2013-04-08 DIAGNOSIS — N189 Chronic kidney disease, unspecified: Secondary | ICD-10-CM | POA: Diagnosis not present

## 2013-04-08 DIAGNOSIS — I509 Heart failure, unspecified: Secondary | ICD-10-CM | POA: Diagnosis not present

## 2013-04-08 DIAGNOSIS — I5043 Acute on chronic combined systolic (congestive) and diastolic (congestive) heart failure: Secondary | ICD-10-CM | POA: Diagnosis not present

## 2013-04-08 DIAGNOSIS — K625 Hemorrhage of anus and rectum: Secondary | ICD-10-CM | POA: Diagnosis not present

## 2013-04-08 DIAGNOSIS — E1149 Type 2 diabetes mellitus with other diabetic neurological complication: Secondary | ICD-10-CM

## 2013-04-08 DIAGNOSIS — I129 Hypertensive chronic kidney disease with stage 1 through stage 4 chronic kidney disease, or unspecified chronic kidney disease: Secondary | ICD-10-CM

## 2013-04-08 DIAGNOSIS — E119 Type 2 diabetes mellitus without complications: Secondary | ICD-10-CM | POA: Diagnosis not present

## 2013-04-08 DIAGNOSIS — E785 Hyperlipidemia, unspecified: Secondary | ICD-10-CM | POA: Diagnosis not present

## 2013-04-08 DIAGNOSIS — I2510 Atherosclerotic heart disease of native coronary artery without angina pectoris: Secondary | ICD-10-CM | POA: Diagnosis not present

## 2013-04-08 DIAGNOSIS — I10 Essential (primary) hypertension: Secondary | ICD-10-CM

## 2013-04-08 MED ORDER — TORSEMIDE 20 MG PO TABS: ORAL_TABLET | ORAL | Status: DC

## 2013-04-08 MED ORDER — ROSUVASTATIN CALCIUM 10 MG PO TABS: 10.0000 mg | ORAL_TABLET | Freq: Every day | ORAL | Status: DC

## 2013-04-08 NOTE — Patient Instructions (Signed)
Please continue medications as prescribed.  I will put in order for repeat lab work for you to get at [facility] on Monday.  Try to stay hydrated.  Please call or return to clinic if you need anything.  We will schedule follow-up based on your next lab results.

## 2013-04-08 NOTE — Progress Notes (Signed)
Pre visit review using our clinic review tool, if applicable. No additional management support is needed unless otherwise documented below in the visit note/SLS  

## 2013-04-08 NOTE — Telephone Encounter (Signed)
I spoke with pt about her lab results. She will decrease her torsemide as directed. She understands when to take an extra torsemide  & will come in on 04/13/13 for repeat bmp Mylo Red RN

## 2013-04-08 NOTE — Telephone Encounter (Signed)
Message copied by Barrie Folk on Wed Apr 08, 2013  1:46 PM ------      Message from: Vowinckel, [place] T      Created: Wed Apr 08, 2013  1:40 PM       Hgb ok      Creatinine increased      Decrease Torsemide to 20 mg QD.      Weigh daily and take extra Torsemide 20 mg if:  Weight up 3 lbs in one day, increased swelling or increased dyspnea.      Check BMET Monday 12/1.      Tereso Newcomer, PA-C        04/08/2013 1:40 PM ------

## 2013-04-10 DIAGNOSIS — I5043 Acute on chronic combined systolic (congestive) and diastolic (congestive) heart failure: Secondary | ICD-10-CM | POA: Diagnosis not present

## 2013-04-10 DIAGNOSIS — I2510 Atherosclerotic heart disease of native coronary artery without angina pectoris: Secondary | ICD-10-CM | POA: Diagnosis not present

## 2013-04-10 DIAGNOSIS — E119 Type 2 diabetes mellitus without complications: Secondary | ICD-10-CM | POA: Diagnosis not present

## 2013-04-10 DIAGNOSIS — I129 Hypertensive chronic kidney disease with stage 1 through stage 4 chronic kidney disease, or unspecified chronic kidney disease: Secondary | ICD-10-CM | POA: Diagnosis not present

## 2013-04-10 DIAGNOSIS — N189 Chronic kidney disease, unspecified: Secondary | ICD-10-CM | POA: Diagnosis not present

## 2013-04-10 DIAGNOSIS — I509 Heart failure, unspecified: Secondary | ICD-10-CM | POA: Diagnosis not present

## 2013-04-13 ENCOUNTER — Other Ambulatory Visit: Payer: Medicare Other

## 2013-04-13 ENCOUNTER — Ambulatory Visit (INDEPENDENT_AMBULATORY_CARE_PROVIDER_SITE_OTHER): Payer: Medicare Other | Admitting: Pharmacist

## 2013-04-13 DIAGNOSIS — I509 Heart failure, unspecified: Secondary | ICD-10-CM | POA: Diagnosis not present

## 2013-04-13 DIAGNOSIS — I129 Hypertensive chronic kidney disease with stage 1 through stage 4 chronic kidney disease, or unspecified chronic kidney disease: Secondary | ICD-10-CM | POA: Diagnosis not present

## 2013-04-13 DIAGNOSIS — I4891 Unspecified atrial fibrillation: Secondary | ICD-10-CM

## 2013-04-13 DIAGNOSIS — N189 Chronic kidney disease, unspecified: Secondary | ICD-10-CM | POA: Diagnosis not present

## 2013-04-13 DIAGNOSIS — I2510 Atherosclerotic heart disease of native coronary artery without angina pectoris: Secondary | ICD-10-CM | POA: Diagnosis not present

## 2013-04-13 DIAGNOSIS — I5043 Acute on chronic combined systolic (congestive) and diastolic (congestive) heart failure: Secondary | ICD-10-CM | POA: Diagnosis not present

## 2013-04-13 DIAGNOSIS — I238 Other current complications following acute myocardial infarction: Secondary | ICD-10-CM

## 2013-04-13 DIAGNOSIS — E119 Type 2 diabetes mellitus without complications: Secondary | ICD-10-CM | POA: Diagnosis not present

## 2013-04-13 DIAGNOSIS — I6350 Cerebral infarction due to unspecified occlusion or stenosis of unspecified cerebral artery: Secondary | ICD-10-CM

## 2013-04-13 NOTE — Assessment & Plan Note (Signed)
BP good in clinic today.  Continue medications as prescribed.  Continue follow-up with Cardiology.

## 2013-04-13 NOTE — Assessment & Plan Note (Signed)
Elevated creatinine.  Patient already has order placed for repeat BMP.  Reminded patient to have labs drawn.  Patient to continue medications as directed by Cardiology

## 2013-04-13 NOTE — Assessment & Plan Note (Signed)
Resolved

## 2013-04-13 NOTE — Progress Notes (Signed)
[name], female   DOB: 01-23-1941, 72 y.o.   MRN: [id number]  Destiny [name] is a 72 y.o. 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 She was treated with diuresis for acute on chronic CHF. Hospital course complicated by PNA as well as acute renal failure as well as rectal bleeding 2/2 Flexiseal.  Patient was discharged with follow-up with Cardiology, Primary Care and Pulmonary Rehabilitation.  Patient given orders for Home Health.  Since discharge from hospital patient endorses she is feeling much better.  Feels stronger every day.  Endorses taking medication as prescribed.  Denies excessive dyspnea, chest pain, palpitations, LH or dizziness.  Denies BRBPR or melena.  Patient endorses taking medications as prescribed.  Has had follow-up with Cardiology who is monitoring her PT/INR.  Last INR 1.5.  Blood sugars have been stable at home.  Patient endorses good urinary output and has been staying well-hydrated.    Of note, patient had labs drawn at Cardiology follow-up on 04/08/13 revealed creatinine of 1.7.  Patient has orders for repeat BMP.  Patient was told to decrease dose of Torsemide to 20 mg QD.       Past Medical History  Diagnosis Date  . LBBB (left bundle branch block)   . Carotid bruit     a. 10/19/2011 carotid duplex - Mild hard plaque bilaterally. Stable 40-59% bilateral ICA stenosis.   Marland Kitchen Dyslipidemia   . Hypertension   . Chronic combined systolic and diastolic CHF (congestive heart failure)     a. EF as low as 25% in 2006;  b. EF 60-65% in 12/2011;  c. 02/2013 Echo: EF 35-40, mod mid-dist antsept HK, Gr 2 DD, mild LVH.  . Back pain, chronic     "just when I walk; mass on 3rd and 4th vertebrae right lower back"  . Cardiomyopathy, ischemic     a. 2012 s/p SJM 3231-40 Uni BiV ICD, ser # [id number].  Marland Kitchen CAD  (coronary artery disease)     a. 05/2001 CABG x4: LIMA->LAD, VG->D1, VG->D2, VG->RCA.  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 CKD (chronic kidney disease), stage III   . Biventricular implantable cardiac defibrillator in situ 2007, 2012    a. 2007;  b. 2012 Gen change: SJM 3231-40 Uni BiV ICD, ser # [id number].  Marland Kitchen Hypoxemia requiring supplemental oxygen   . Candidiasis of vulva and vagina   . Hypothyroidism   . 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 Truett Perna, in remission  . History of pneumonia 12/27/11    "3 times; it's been a long time ago"  . OSA on CPAP   . Gout ~ 08/2011  . PAF (paroxysmal atrial fibrillation)     on Coumadin  . DM (diabetes mellitus), type 2 with complications     insulin dependent, retinopathy, neuropathy  . Mural thrombus of left ventricle     before 2003, while on Coumadin, No h/o CVA  . Morbid obesity with BMI of 45.0-49.9, adult     Ht. 5'4". BMI 47.2  . Vitamin D deficiency 06/09/2012    historical  . Olecranon bursitis of right elbow 12/29/2012  . History of recurrent UTIs    Current Outpatient Prescriptions on File Prior to Visit  Medication Sig Dispense  Refill  . albuterol (PROVENTIL HFA;VENTOLIN HFA) 108 (90 BASE) MCG/ACT inhaler Inhale 2 puffs into the lungs every 6 (six) hours as needed for wheezing or shortness of breath.  1 Inhaler  1  . amLODipine (NORVASC) 5 MG tablet Take 1 tablet (5 mg total) by mouth daily.  30 tablet  3  . Budesonide 90 MCG/ACT inhaler Inhale 1 puff into the lungs 2 (two) times daily.  1 Inhaler  1  . calcium-vitamin D (OSCAL WITH D) 500-200 MG-UNIT per tablet Take 1 tablet by mouth daily with breakfast.      . carvedilol (COREG) 6.25 MG tablet Take 1.5 tablets (9.375 mg total) by mouth 2 (two) times daily with a meal.  90 tablet  11  . enalapril (VASOTEC) 10 MG tablet Take 2 tablets (20 mg total) by mouth daily. Note increase in dose  30 tablet  3  .  fluticasone (FLONASE) 50 MCG/ACT nasal spray Place 2 sprays into both nostrils daily as needed for allergies or rhinitis. As needed for nasal stuffiness.    2  . gabapentin (NEURONTIN) 300 MG capsule Take 1 capsule (300 mg total) by mouth 2 (two) times daily.  60 capsule  2  . insulin NPH (HUMULIN N,NOVOLIN N) 100 UNIT/ML injection Inject 25-44 Units into the skin 2 (two) times daily before a meal. Take 44 units in the morning and 25 units in the evening      . insulin regular (NOVOLIN R,HUMULIN R) 100 units/mL injection Change to 15 units three times a with meals (to cover all meals--this is the same total amount but spread out)  10 mL  12  . KRILL OIL PO Take 1 capsule by mouth daily. Mega Red      . levothyroxine (SYNTHROID, LEVOTHROID) 25 MCG tablet Take 25 mcg by mouth daily before breakfast.      . LORazepam (ATIVAN) 0.5 MG tablet Take 0.5 mg by mouth at bedtime.      . Multiple Vitamins-Minerals (MULTIVITAMIN WITH MINERALS) tablet Take 1 tablet by mouth daily.        . traMADol (ULTRAM) 50 MG tablet Take 1 tablet (50 mg total) by mouth 2 (two) times daily as needed for pain.  60 tablet  1  . traZODone (DESYREL) 50 MG tablet Take 0.5-1 tablets (25-50 mg total) by mouth at bedtime as needed for sleep.  30 tablet  0  . Vitamin D, Ergocalciferol, (DRISDOL) 50000 UNITS CAPS capsule Take 50,000 Units by mouth every 7 (seven) days. On Tuesday      . warfarin (COUMADIN) 5 MG tablet Take two pills daily in evening.  60 tablet  1   No current facility-administered medications on file prior to visit.   Allergies  Allergen Reactions  . Sulfonamide Derivatives Other (See Comments)    Unknown; "childhood allergy/mother"   Family History  Problem Relation Age of Onset  . Kidney cancer Mother     kidney and female repo - died @ 17  . Heart disease Father     died @ 17  . Asthma Mother   . Stroke Father    History   Social History  . Marital Status: Married    Spouse Name: N/A    Number of  Children: Y  . Years of Education: N/A   Occupational History  . 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    Social History Main Topics  . Smoking status: Never Smoker   . Smokeless tobacco: Never  Used  . Alcohol Use: No  . Drug Use: No  . Sexual Activity: Not Currently   Other Topics Concern  . None   Social History Narrative   Lives in [place] with her husband.  She does not routinely exercise or adhere to any particular diet.     ROS See HPI.  All other ROS are negative.  Filed Vitals:   04/08/13 0941  BP: 120/62  Pulse: 74  Temp: 98.3 F (36.8 C)  Resp: 16   Physical Exam  Vitals reviewed. Constitutional: She is oriented to person, place, and time and well-developed, well-nourished, and in no distress.  HENT:  Head: Normocephalic and atraumatic.  Eyes: Conjunctivae are normal. Pupils are equal, round, and reactive to light.  Neck: Neck supple.  Cardiovascular: Normal rate, regular rhythm, normal heart sounds and intact distal pulses.   Pulmonary/Chest: Effort normal and breath sounds normal. No respiratory distress. She has no wheezes. She has no rales. She exhibits no tenderness.  Lymphadenopathy:    She has no cervical adenopathy.  Neurological: She is alert and oriented to person, place, and time. No cranial nerve deficit.  Skin: Skin is warm and dry. No rash noted.  Psychiatric: Affect normal.   LABS/IMAGING:  CBC    Component Value Date/Time   WBC 6.9 04/07/2013 1452   WBC 7.5 07/26/2011 1443   RBC 3.86* 04/07/2013 1452   RBC 4.05 07/26/2011 1443   HGB 11.5* 04/07/2013 1452   HGB 12.7 07/26/2011 1443   HCT 35.0* 04/07/2013 1452   HCT 37.0 07/26/2011 1443   PLT 197.0 04/07/2013 1452   PLT 186 07/26/2011 1443   MCV 90.5 04/07/2013 1452   MCV 91.5 07/26/2011 1443   MCH 30.4 04/01/2013 0812   MCH 31.3 07/26/2011 1443   MCHC 32.8 04/07/2013 1452   MCHC 34.2 07/26/2011 1443   RDW 16.2* 04/07/2013 1452   RDW 14.0 07/26/2011 1443   LYMPHSABS 2.1 04/07/2013  1452   LYMPHSABS 2.0 07/26/2011 1443   MONOABS 0.9 04/07/2013 1452   MONOABS 0.5 07/26/2011 1443   EOSABS 0.3 04/07/2013 1452   EOSABS 0.2 07/26/2011 1443   BASOSABS 0.0 04/07/2013 1452   BASOSABS 0.0 07/26/2011 1443   CMP     Component Value Date/Time   NA 137 04/07/2013 1452   K 4.1 04/07/2013 1452   CL 94* 04/07/2013 1452   CO2 36* 04/07/2013 1452   GLUCOSE 133* 04/07/2013 1452   GLUCOSE 276* 04/22/2006 0900   BUN 31* 04/07/2013 1452   CREATININE 1.7* 04/07/2013 1452   CREATININE 2.23* 12/26/2011 1236   CALCIUM 10.1 04/07/2013 1452   PROT 6.6 03/25/2013 0650   ALBUMIN 2.9* 03/25/2013 0650   AST 40* 03/25/2013 0650   ALT 43* 03/25/2013 0650   ALKPHOS 65 03/25/2013 0650   BILITOT 0.5 03/25/2013 0650   GFRNONAA 46* 03/30/2013 0500   GFRAA 53* 03/30/2013 0500   03/21/2013: Pro B Natriuretic peptide (BNP) 6000.0*  03/25/2013: ALT 43*  03/30/2013: Creatinine 1.17*; Potassium 4.1  04/01/2013: Hemoglobin 10.9*   EKG 04/07/13 -- Paced rhythm  EXAM:  CHEST 2 VIEW  COMPARISON: 03/26/2013.  FINDINGS:  Mediastinum is normal. Persistent cardiomegaly and pulmonary  vascular prominence with bilateral interstitial and alveolar  infiltrates are again noted. Interstitial/alveolar edema has  improved from prior exam. There is however progressive atelectasis  in both lung bases. Prior CABG. Cardiac pacer in stable position.  Old right rib fractures. No pneumothorax.  IMPRESSION:  1. Interim improvement of congestive heart failure  and pulmonary  edema.  2. Progressive bibasilar atelectasis. Underlying pneumonia cannot be  excluded.    HYPERTENSION BP good in clinic today.  Continue medications as prescribed.  Continue follow-up with Cardiology.  Rectal bleeding--due to hems/rectal tube irritation Resolved.    DIABETES MELLITUS, WITH AUTONOMIC NEUROPATHY Continue current regimen.  CKD (chronic kidney disease) stage 3, GFR 30-59 ml/min Elevated creatinine.  Patient already has  order placed for repeat BMP.  Reminded patient to have labs drawn.  Patient to continue medications as directed by Cardiology  Acute on chronic combined systolic and diastolic heart failure Patient to continue follow-up with Cardiology.  Continue medications as prescribed.  Call or return to clinic if shortness of breath increases from baseline.

## 2013-04-13 NOTE — Assessment & Plan Note (Signed)
Patient to continue follow-up with Cardiology.  Continue medications as prescribed.  Call or return to clinic if shortness of breath increases from baseline.

## 2013-04-13 NOTE — Assessment & Plan Note (Signed)
Continue current regimen

## 2013-04-14 ENCOUNTER — Other Ambulatory Visit: Payer: Medicare Other

## 2013-04-14 ENCOUNTER — Encounter (HOSPITAL_COMMUNITY): Payer: Medicare Other

## 2013-04-15 DIAGNOSIS — I2510 Atherosclerotic heart disease of native coronary artery without angina pectoris: Secondary | ICD-10-CM | POA: Diagnosis not present

## 2013-04-15 DIAGNOSIS — I509 Heart failure, unspecified: Secondary | ICD-10-CM | POA: Diagnosis not present

## 2013-04-15 DIAGNOSIS — I5043 Acute on chronic combined systolic (congestive) and diastolic (congestive) heart failure: Secondary | ICD-10-CM | POA: Diagnosis not present

## 2013-04-15 DIAGNOSIS — E119 Type 2 diabetes mellitus without complications: Secondary | ICD-10-CM | POA: Diagnosis not present

## 2013-04-15 DIAGNOSIS — I129 Hypertensive chronic kidney disease with stage 1 through stage 4 chronic kidney disease, or unspecified chronic kidney disease: Secondary | ICD-10-CM | POA: Diagnosis not present

## 2013-04-15 DIAGNOSIS — N189 Chronic kidney disease, unspecified: Secondary | ICD-10-CM | POA: Diagnosis not present

## 2013-04-16 ENCOUNTER — Telehealth: Payer: Self-pay | Admitting: Family Medicine

## 2013-04-16 DIAGNOSIS — I10 Essential (primary) hypertension: Secondary | ICD-10-CM | POA: Diagnosis not present

## 2013-04-16 DIAGNOSIS — E039 Hypothyroidism, unspecified: Secondary | ICD-10-CM | POA: Diagnosis not present

## 2013-04-16 NOTE — Telephone Encounter (Signed)
OK to refill Enalapril bid #60 but I need to have a follow up before that runs out so I can check her bp

## 2013-04-16 NOTE — Telephone Encounter (Signed)
Do you want pt taking two? Please advise before I send in refill?

## 2013-04-16 NOTE — Telephone Encounter (Signed)
Patient states that she was taking one enalapril a day but when she was in the hospital the doctor there told her to take two a day. She states that she is running out of the medication and would like a refill sent to [facility] at [facility]

## 2013-04-17 ENCOUNTER — Encounter: Payer: Self-pay | Admitting: *Deleted

## 2013-04-17 MED ORDER — ENALAPRIL MALEATE 10 MG PO TABS: 20.0000 mg | ORAL_TABLET | Freq: Every day | ORAL | Status: DC

## 2013-04-17 NOTE — Telephone Encounter (Signed)
RX sent and detailed message on answering machine left

## 2013-04-20 ENCOUNTER — Ambulatory Visit (INDEPENDENT_AMBULATORY_CARE_PROVIDER_SITE_OTHER): Payer: Medicare Other | Admitting: Cardiology

## 2013-04-20 DIAGNOSIS — I509 Heart failure, unspecified: Secondary | ICD-10-CM | POA: Diagnosis not present

## 2013-04-20 DIAGNOSIS — I129 Hypertensive chronic kidney disease with stage 1 through stage 4 chronic kidney disease, or unspecified chronic kidney disease: Secondary | ICD-10-CM | POA: Diagnosis not present

## 2013-04-20 DIAGNOSIS — I4891 Unspecified atrial fibrillation: Secondary | ICD-10-CM

## 2013-04-20 DIAGNOSIS — I5043 Acute on chronic combined systolic (congestive) and diastolic (congestive) heart failure: Secondary | ICD-10-CM | POA: Diagnosis not present

## 2013-04-20 DIAGNOSIS — I6350 Cerebral infarction due to unspecified occlusion or stenosis of unspecified cerebral artery: Secondary | ICD-10-CM

## 2013-04-20 DIAGNOSIS — I2510 Atherosclerotic heart disease of native coronary artery without angina pectoris: Secondary | ICD-10-CM | POA: Diagnosis not present

## 2013-04-20 DIAGNOSIS — N189 Chronic kidney disease, unspecified: Secondary | ICD-10-CM | POA: Diagnosis not present

## 2013-04-20 DIAGNOSIS — E119 Type 2 diabetes mellitus without complications: Secondary | ICD-10-CM | POA: Diagnosis not present

## 2013-04-20 DIAGNOSIS — I238 Other current complications following acute myocardial infarction: Secondary | ICD-10-CM

## 2013-04-21 ENCOUNTER — Telehealth: Payer: Self-pay | Admitting: *Deleted

## 2013-04-21 ENCOUNTER — Ambulatory Visit (HOSPITAL_COMMUNITY): Payer: Medicare Other | Attending: Physician Assistant

## 2013-04-21 ENCOUNTER — Encounter: Payer: Self-pay | Admitting: Cardiology

## 2013-04-21 ENCOUNTER — Ambulatory Visit (INDEPENDENT_AMBULATORY_CARE_PROVIDER_SITE_OTHER): Payer: Medicare Other | Admitting: *Deleted

## 2013-04-21 DIAGNOSIS — E119 Type 2 diabetes mellitus without complications: Secondary | ICD-10-CM | POA: Insufficient documentation

## 2013-04-21 DIAGNOSIS — Z951 Presence of aortocoronary bypass graft: Secondary | ICD-10-CM | POA: Diagnosis not present

## 2013-04-21 DIAGNOSIS — I5042 Chronic combined systolic (congestive) and diastolic (congestive) heart failure: Secondary | ICD-10-CM

## 2013-04-21 DIAGNOSIS — I658 Occlusion and stenosis of other precerebral arteries: Secondary | ICD-10-CM | POA: Insufficient documentation

## 2013-04-21 DIAGNOSIS — E785 Hyperlipidemia, unspecified: Secondary | ICD-10-CM | POA: Diagnosis not present

## 2013-04-21 DIAGNOSIS — I2510 Atherosclerotic heart disease of native coronary artery without angina pectoris: Secondary | ICD-10-CM | POA: Insufficient documentation

## 2013-04-21 DIAGNOSIS — I509 Heart failure, unspecified: Secondary | ICD-10-CM | POA: Diagnosis not present

## 2013-04-21 DIAGNOSIS — Z8673 Personal history of transient ischemic attack (TIA), and cerebral infarction without residual deficits: Secondary | ICD-10-CM | POA: Diagnosis not present

## 2013-04-21 DIAGNOSIS — I10 Essential (primary) hypertension: Secondary | ICD-10-CM | POA: Insufficient documentation

## 2013-04-21 DIAGNOSIS — I6529 Occlusion and stenosis of unspecified carotid artery: Secondary | ICD-10-CM | POA: Diagnosis not present

## 2013-04-21 LAB — CBC WITH DIFFERENTIAL/PLATELET
Basophils Absolute: 0 10*3/uL (ref 0.0–0.1)
Basophils Relative: 0.5 % (ref 0.0–3.0)
Eosinophils Absolute: 0.2 10*3/uL (ref 0.0–0.7)
Eosinophils Relative: 2.4 % (ref 0.0–5.0)
Lymphocytes Relative: 26.5 % (ref 12.0–46.0)
MCHC: 33.4 g/dL (ref 30.0–36.0)
Monocytes Relative: 8.6 % (ref 3.0–12.0)
Neutrophils Relative %: 62 % (ref 43.0–77.0)
RBC: 4.1 Mil/uL (ref 3.87–5.11)
RDW: 15.8 % — ABNORMAL HIGH (ref 11.5–14.6)

## 2013-04-21 LAB — BASIC METABOLIC PANEL
CO2: 31 mEq/L (ref 19–32)
Calcium: 9.8 mg/dL (ref 8.4–10.5)
Creatinine, Ser: 1.6 mg/dL — ABNORMAL HIGH (ref 0.4–1.2)
GFR: 32.86 mL/min — ABNORMAL LOW (ref >60.00)
Glucose, Bld: 99 mg/dL (ref 70–99)
Sodium: 133 mEq/L — ABNORMAL LOW (ref 135–145)

## 2013-04-21 NOTE — Telephone Encounter (Signed)
Received call from [facility] with [facility] requesting [place] to place order for pt's labs as pt is having bmp ordered by their physician. Did not see orders or notations in last office note by Provider here for further labs. She states CBC was handwritten on pt's last AVS from our office and she has placed the order.

## 2013-04-22 ENCOUNTER — Telehealth: Payer: Self-pay | Admitting: *Deleted

## 2013-04-22 DIAGNOSIS — I2510 Atherosclerotic heart disease of native coronary artery without angina pectoris: Secondary | ICD-10-CM | POA: Diagnosis not present

## 2013-04-22 DIAGNOSIS — I129 Hypertensive chronic kidney disease with stage 1 through stage 4 chronic kidney disease, or unspecified chronic kidney disease: Secondary | ICD-10-CM | POA: Diagnosis not present

## 2013-04-22 DIAGNOSIS — I509 Heart failure, unspecified: Secondary | ICD-10-CM | POA: Diagnosis not present

## 2013-04-22 DIAGNOSIS — E119 Type 2 diabetes mellitus without complications: Secondary | ICD-10-CM | POA: Diagnosis not present

## 2013-04-22 DIAGNOSIS — N189 Chronic kidney disease, unspecified: Secondary | ICD-10-CM | POA: Diagnosis not present

## 2013-04-22 DIAGNOSIS — I5043 Acute on chronic combined systolic (congestive) and diastolic (congestive) heart failure: Secondary | ICD-10-CM | POA: Diagnosis not present

## 2013-04-22 NOTE — Telephone Encounter (Signed)
pt notified about lab results and to hold demadex x 1 day, she will hold 12/11 and resume 12/12; advised only take the extra demadex for 3# wt gain, sob, edema, not on a daily basis as pt just stated she has been doing for 1 wk now. Advised keep CHF appt

## 2013-04-23 ENCOUNTER — Encounter: Payer: Self-pay | Admitting: Physician Assistant

## 2013-04-27 ENCOUNTER — Ambulatory Visit (INDEPENDENT_AMBULATORY_CARE_PROVIDER_SITE_OTHER): Payer: Medicare Other | Admitting: Pharmacist

## 2013-04-27 DIAGNOSIS — N189 Chronic kidney disease, unspecified: Secondary | ICD-10-CM | POA: Diagnosis not present

## 2013-04-27 DIAGNOSIS — I129 Hypertensive chronic kidney disease with stage 1 through stage 4 chronic kidney disease, or unspecified chronic kidney disease: Secondary | ICD-10-CM | POA: Diagnosis not present

## 2013-04-27 DIAGNOSIS — I509 Heart failure, unspecified: Secondary | ICD-10-CM | POA: Diagnosis not present

## 2013-04-27 DIAGNOSIS — E119 Type 2 diabetes mellitus without complications: Secondary | ICD-10-CM | POA: Diagnosis not present

## 2013-04-27 DIAGNOSIS — I2510 Atherosclerotic heart disease of native coronary artery without angina pectoris: Secondary | ICD-10-CM | POA: Diagnosis not present

## 2013-04-27 DIAGNOSIS — I238 Other current complications following acute myocardial infarction: Secondary | ICD-10-CM

## 2013-04-27 DIAGNOSIS — I6350 Cerebral infarction due to unspecified occlusion or stenosis of unspecified cerebral artery: Secondary | ICD-10-CM

## 2013-04-27 DIAGNOSIS — I5043 Acute on chronic combined systolic (congestive) and diastolic (congestive) heart failure: Secondary | ICD-10-CM | POA: Diagnosis not present

## 2013-04-27 DIAGNOSIS — I4891 Unspecified atrial fibrillation: Secondary | ICD-10-CM

## 2013-04-27 LAB — POCT INR: INR: 1.6

## 2013-04-29 DIAGNOSIS — N189 Chronic kidney disease, unspecified: Secondary | ICD-10-CM | POA: Diagnosis not present

## 2013-04-29 DIAGNOSIS — E119 Type 2 diabetes mellitus without complications: Secondary | ICD-10-CM | POA: Diagnosis not present

## 2013-04-29 DIAGNOSIS — I129 Hypertensive chronic kidney disease with stage 1 through stage 4 chronic kidney disease, or unspecified chronic kidney disease: Secondary | ICD-10-CM | POA: Diagnosis not present

## 2013-04-29 DIAGNOSIS — I2510 Atherosclerotic heart disease of native coronary artery without angina pectoris: Secondary | ICD-10-CM | POA: Diagnosis not present

## 2013-04-29 DIAGNOSIS — I509 Heart failure, unspecified: Secondary | ICD-10-CM | POA: Diagnosis not present

## 2013-04-29 DIAGNOSIS — I5043 Acute on chronic combined systolic (congestive) and diastolic (congestive) heart failure: Secondary | ICD-10-CM | POA: Diagnosis not present

## 2013-05-01 ENCOUNTER — Encounter (HOSPITAL_COMMUNITY): Payer: Self-pay

## 2013-05-01 ENCOUNTER — Ambulatory Visit (HOSPITAL_COMMUNITY)
Admission: RE | Admit: 2013-05-01 | Discharge: 2013-05-01 | Disposition: A | Discharge status: Home / Self Care | Payer: Medicare Other | Source: Ambulatory Visit | Attending: Internal Medicine | Admitting: Internal Medicine

## 2013-05-01 VITALS — BP 138/60 | HR 71 | Wt 260.4 lb

## 2013-05-01 DIAGNOSIS — G4733 Obstructive sleep apnea (adult) (pediatric): Secondary | ICD-10-CM | POA: Diagnosis not present

## 2013-05-01 DIAGNOSIS — I4891 Unspecified atrial fibrillation: Secondary | ICD-10-CM

## 2013-05-01 DIAGNOSIS — I2589 Other forms of chronic ischemic heart disease: Secondary | ICD-10-CM

## 2013-05-01 DIAGNOSIS — I5042 Chronic combined systolic (congestive) and diastolic (congestive) heart failure: Secondary | ICD-10-CM | POA: Diagnosis not present

## 2013-05-01 DIAGNOSIS — I10 Essential (primary) hypertension: Secondary | ICD-10-CM

## 2013-05-01 DIAGNOSIS — I5022 Chronic systolic (congestive) heart failure: Secondary | ICD-10-CM | POA: Insufficient documentation

## 2013-05-01 DIAGNOSIS — I5043 Acute on chronic combined systolic (congestive) and diastolic (congestive) heart failure: Secondary | ICD-10-CM | POA: Insufficient documentation

## 2013-05-01 LAB — BASIC METABOLIC PANEL
CO2: 28 mEq/L (ref 19–32)
Chloride: 97 mEq/L (ref 96–112)
GFR calc Af Amer: 41 mL/min — ABNORMAL LOW (ref >90)
GFR calc non Af Amer: 35 mL/min — ABNORMAL LOW (ref >90)
Glucose, Bld: 170 mg/dL — ABNORMAL HIGH (ref 70–99)
Potassium: 5.4 mEq/L — ABNORMAL HIGH (ref 3.5–5.1)
Sodium: 134 mEq/L — ABNORMAL LOW (ref 135–145)

## 2013-05-01 LAB — PRO B NATRIURETIC PEPTIDE: Pro B Natriuretic peptide (BNP): 646.7 pg/mL — ABNORMAL HIGH (ref 0–125)

## 2013-05-01 MED ORDER — CARVEDILOL 12.5 MG PO TABS: 12.5000 mg | ORAL_TABLET | Freq: Two times a day (BID) | ORAL | Status: DC

## 2013-05-01 NOTE — Patient Instructions (Signed)
Increase your coreg to 12.5 mg (2 tablets) twice a day. When you get your new prescription you will have a 12.5 mg tablet and then you will take 1 tablet twice a day.  Doing great.  You can take an extra fluid pill if you gain more than 3 weights in a day.  Call if any issues [phone]  F/U 3 weeks.   Do the following things EVERYDAY: 1) Weigh yourself in the morning before breakfast. Write it down and keep it in a log. 2) Take your medicines as prescribed 3) Eat low salt foods-Limit salt (sodium) to 2000 mg per day.  4) Stay as active as you can everyday 5) Limit all fluids for the day to less than 2 liters 6)

## 2013-05-01 NOTE — Progress Notes (Addendum)
[name], female   DOB: [birth year], 72 y.o.   MRN: [id number]   HPI: Destiny [name] is a 72 y.o. female with a history of CAD, status post CABG in 2003, ischemic cardiomyopathy with variable ejection fractions noted in the past (echo in 12/2011 demonstrated normal LV function with an EF 60-65%), status post CRT-D, chronic combined systolic and diastolic CHF, paroxysmal atrial fibrillation, prior LV mural thrombus, chronic Coumadin therapy, CKD, T2DM. She has a prior admission to the hospital for acute renal failure in the setting of rhabdomyolysis (while taking colchicine and fenofibrate). Carotid US (09/2011): Bilateral ICA stenosis 40-59%.   She was admitted 10/23-11/11 with a/c combined systolic and diastolic CHF. Echocardiogram demonstrated worsening LV function with an EF of 35-40%. She was diuresed and cardiology initially recommended proceeding with an outpatient Myoview to rule out ischemia as it had been 11 years since her bypass surgery. She was treated with antibiotics for left lower extremity cellulitis. Her hospital course was complicated by a/c renal failure the setting of diuresis. She ultimately developed fever and hypoxia progressing to ventilator dependent respiratory failure. She was treated with IV antibiotics for HCAP. She was made a DO NOT RESUSCITATE/DO NOT INTUBATE. She did have symptoms of epistaxis and was seen by cardiology again. Our service made recommendations regarding anticoagulation going forward. She was discharged to inpatient rehabilitation x 1 week. Cardiology continued to follow her in rehab and had to adjust diuretics for a/c chronic heart failure. Discharge weight 256 lbs.   Follow up: Last visit increased coreg to 9.375 mg BID which she tolerated. Doing well. Has HHRN, PT, OT. Now can go from bedroom to bathroom without walker or O2. Denies SOB, PND, orthopnea, or CP. Taking medications as prescribed. Following a low salt diet and drinking less than 2L  a day. Trying to be more active. Sleeps with CPAP daily.    ROS: All systems negative except as listed in HPI, PMH and Problem List.  Past Medical History  Diagnosis Date  . LBBB (left bundle branch block)   . Carotid stenosis     a. 10/19/2011 carotid duplex - Mild hard plaque bilaterally. Stable 40-59% bilateral ICA stenosis. Carotid US (04/2013):  Bilateral 40-59% ICA.  F/u 1 year  . Dyslipidemia   . Hypertension   . Chronic combined systolic and diastolic CHF (congestive heart failure)     a. EF as low as 25% in 2006;  b. EF 60-65% in 12/2011;  c. 02/2013 Echo: EF 35-40, mod mid-dist antsept HK, Gr 2 DD, mild LVH.  . Back pain, chronic     "just when I walk; mass on 3rd and 4th vertebrae right lower back"  . Cardiomyopathy, ischemic     a. 2012 s/p SJM 3231-40 Uni BiV ICD, ser # [id number].  Marland Kitchen CAD (coronary artery disease)     a. 05/2001 CABG x4: LIMA->LAD, VG->D1, VG->D2, VG->RCA.  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 CKD (chronic kidney disease), stage III   . Biventricular implantable cardiac defibrillator in situ 2007, 2012    a. 2007;  b. 2012 Gen change: SJM 3231-40 Uni BiV ICD, ser # [id number].  Marland Kitchen Hypoxemia requiring supplemental oxygen   . Candidiasis of vulva and vagina   . Hypothyroidism   . 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 Truett Perna, in remission  . History of pneumonia 12/27/11    "3 times; it's been a  long time ago"  . OSA on CPAP   . Gout ~ 08/2011  . PAF (paroxysmal atrial fibrillation)     on Coumadin  . DM (diabetes mellitus), type 2 with complications     insulin dependent, retinopathy, neuropathy  . Mural thrombus of left ventricle     before 2003, while on Coumadin, No h/o CVA  . Morbid obesity with BMI of 45.0-49.9, adult     Ht. 5'4". BMI 47.2  . Vitamin D deficiency 06/09/2012    historical  . Olecranon bursitis of right elbow 12/29/2012  . History of recurrent UTIs     Current  Outpatient Prescriptions  Medication Sig Dispense Refill  . albuterol (PROVENTIL HFA;VENTOLIN HFA) 108 (90 BASE) MCG/ACT inhaler Inhale 2 puffs into the lungs every 6 (six) hours as needed for wheezing or shortness of breath.  1 Inhaler  1  . amLODipine (NORVASC) 5 MG tablet Take 1 tablet (5 mg total) by mouth daily.  30 tablet  3  . calcium-vitamin D (OSCAL WITH D) 500-200 MG-UNIT per tablet Take 1 tablet by mouth daily with breakfast.      . carvedilol (COREG) 6.25 MG tablet Take 1.5 tablets (9.375 mg total) by mouth 2 (two) times daily with a meal.  90 tablet  11  . enalapril (VASOTEC) 10 MG tablet Take 2 tablets (20 mg total) by mouth daily.  60 tablet  0  . fluticasone (FLONASE) 50 MCG/ACT nasal spray Place 2 sprays into both nostrils daily as needed for allergies or rhinitis. As needed for nasal stuffiness.    2  . gabapentin (NEURONTIN) 300 MG capsule Take 1 capsule (300 mg total) by mouth 2 (two) times daily.  60 capsule  2  . insulin NPH (HUMULIN N,NOVOLIN N) 100 UNIT/ML injection Inject 25-44 Units into the skin 2 (two) times daily before a meal. Take 44 units in the morning and 25 units in the evening      . insulin regular (NOVOLIN R,HUMULIN R) 100 units/mL injection Change to 15 units three times a with meals (to cover all meals--this is the same total amount but spread out)  10 mL  12  . KRILL OIL PO Take 1 capsule by mouth daily. Mega Red      . levothyroxine (SYNTHROID, LEVOTHROID) 25 MCG tablet Take 25 mcg by mouth daily before breakfast.      . LORazepam (ATIVAN) 0.5 MG tablet Take 0.5 mg by mouth at bedtime.      . Multiple Vitamins-Minerals (MULTIVITAMIN WITH MINERALS) tablet Take 1 tablet by mouth daily.        . rosuvastatin (CRESTOR) 10 MG tablet Take 1 tablet (10 mg total) by mouth daily.  30 tablet  6  . torsemide (DEMADEX) 20 MG tablet 1 tablet daily. May take an extra tablet for 3pound weight gain, increased swelling, or shortness of breath  120 tablet    . traMADol  (ULTRAM) 50 MG tablet Take 1 tablet (50 mg total) by mouth 2 (two) times daily as needed for pain.  60 tablet  1  . traZODone (DESYREL) 50 MG tablet Take 0.5-1 tablets (25-50 mg total) by mouth at bedtime as needed for sleep.  30 tablet  0  . Vitamin D, Ergocalciferol, (DRISDOL) 50000 UNITS CAPS capsule Take 50,000 Units by mouth every 7 (seven) days. On Tuesday      . warfarin (COUMADIN) 5 MG tablet Take two pills daily in evening.  60 tablet  1   No current facility-administered  medications for this encounter.    Filed Vitals:   05/01/13 0901  BP: 138/60  Pulse: 71  Weight: 260 lb 6.4 oz (118.117 kg)  SpO2: 96%    PHYSICAL EXAM: General:  Chronically ill appearing. No resp difficulty; on 2 L continuous.  HEENT: normal Neck: supple. JVP difficult to assess d/t thick neck but does not appear elevated. Carotids 2+ bilaterally; no bruits. No lymphadenopathy or thryomegaly appreciated. Cor: PMI normal. Regular rate & rhythm. No rubs, gallops or murmurs. Lungs: clear Abdomen: soft, nontender, obese, nondistended. No hepatosplenomegaly. No bruits or masses. Good bowel sounds. Extremities: no cyanosis, clubbing, rash, trace L>R edema Neuro: alert & orientedx3, cranial nerves grossly intact. Moves all 4 extremities w/o difficulty. Affect pleasant.  ASSESSMENT & PLAN:  1) Chronic systolic HF: ICM, EF 35-40% (02/2013) s/p CRT-D ([employer]) - NYHA III symptoms and volume status stable Will continue torsemide 20 mg daily and discussed the use of sliding scale diuretics. Instructed to take 10 mg torsemide (1/2 tablet) if weight increases more than 3 lbs in a day. - Will increase coreg to 12.5 mg BID. Continue enalapril at current dose, will recheck BMET today with recent increase in Cr will not titrate. - Could consider adding digoxin next visit.  - Provided patient with HF patient education book and discussed how the HF clinic works.  - Reinforced the need and importance of daily weights, a low  sodium diet, and fluid restriction (less than 2 L a day). Instructed to call the HF clinic if weight increases more than 3 lbs overnight or 5 lbs in a week.  2) PAF - Appears to be in NSR today by exam. Will continue BB and Coumadin. 3) OSA - Encouraged her to continue to wear CPAP nightly.  4) HTN - As above will increase coreg for LV dysfunction. Continue current ACE-I and norvasc.   F/U 3 weeks for medication titration. Ulla Potash B NP-C 10:36 AM

## 2013-05-04 ENCOUNTER — Ambulatory Visit (INDEPENDENT_AMBULATORY_CARE_PROVIDER_SITE_OTHER): Payer: Medicare Other | Admitting: Pharmacist

## 2013-05-04 DIAGNOSIS — E119 Type 2 diabetes mellitus without complications: Secondary | ICD-10-CM | POA: Diagnosis not present

## 2013-05-04 DIAGNOSIS — I5043 Acute on chronic combined systolic (congestive) and diastolic (congestive) heart failure: Secondary | ICD-10-CM | POA: Diagnosis not present

## 2013-05-04 DIAGNOSIS — I238 Other current complications following acute myocardial infarction: Secondary | ICD-10-CM

## 2013-05-04 DIAGNOSIS — I6350 Cerebral infarction due to unspecified occlusion or stenosis of unspecified cerebral artery: Secondary | ICD-10-CM

## 2013-05-04 DIAGNOSIS — I2510 Atherosclerotic heart disease of native coronary artery without angina pectoris: Secondary | ICD-10-CM | POA: Diagnosis not present

## 2013-05-04 DIAGNOSIS — N189 Chronic kidney disease, unspecified: Secondary | ICD-10-CM | POA: Diagnosis not present

## 2013-05-04 DIAGNOSIS — I4891 Unspecified atrial fibrillation: Secondary | ICD-10-CM

## 2013-05-04 DIAGNOSIS — I129 Hypertensive chronic kidney disease with stage 1 through stage 4 chronic kidney disease, or unspecified chronic kidney disease: Secondary | ICD-10-CM | POA: Diagnosis not present

## 2013-05-04 DIAGNOSIS — I509 Heart failure, unspecified: Secondary | ICD-10-CM | POA: Diagnosis not present

## 2013-05-04 LAB — POCT INR: INR: 1.5

## 2013-05-05 ENCOUNTER — Telehealth: Payer: Self-pay | Admitting: Family Medicine

## 2013-05-05 NOTE — Telephone Encounter (Signed)
Pharmacy updated.

## 2013-05-05 NOTE — Telephone Encounter (Signed)
Patient called in stating that she is now using the [facility] on [address] for her pharmacy

## 2013-05-06 DIAGNOSIS — E119 Type 2 diabetes mellitus without complications: Secondary | ICD-10-CM | POA: Diagnosis not present

## 2013-05-06 DIAGNOSIS — I509 Heart failure, unspecified: Secondary | ICD-10-CM | POA: Diagnosis not present

## 2013-05-06 DIAGNOSIS — N189 Chronic kidney disease, unspecified: Secondary | ICD-10-CM | POA: Diagnosis not present

## 2013-05-06 DIAGNOSIS — I2510 Atherosclerotic heart disease of native coronary artery without angina pectoris: Secondary | ICD-10-CM | POA: Diagnosis not present

## 2013-05-06 DIAGNOSIS — I129 Hypertensive chronic kidney disease with stage 1 through stage 4 chronic kidney disease, or unspecified chronic kidney disease: Secondary | ICD-10-CM | POA: Diagnosis not present

## 2013-05-06 DIAGNOSIS — I5043 Acute on chronic combined systolic (congestive) and diastolic (congestive) heart failure: Secondary | ICD-10-CM | POA: Diagnosis not present

## 2013-05-08 ENCOUNTER — Telehealth: Payer: Self-pay

## 2013-05-08 ENCOUNTER — Telehealth: Payer: Self-pay | Admitting: Family Medicine

## 2013-05-08 MED ORDER — GLUCOSE BLOOD VI STRP: ORAL_STRIP | Status: DC

## 2013-05-08 NOTE — Telephone Encounter (Signed)
Pt is requesting call after paperwork is faxed back to pharmacy

## 2013-05-08 NOTE — Telephone Encounter (Signed)
See prior phone note. 

## 2013-05-08 NOTE — Telephone Encounter (Signed)
Received paperwork from [facility] pharmacy for additional medicare information

## 2013-05-08 NOTE — Telephone Encounter (Signed)
Pt informed (after talking with Marj) that Marj faxed the paperwork on 05-05-13. Pt states she is going to call the pharmacy

## 2013-05-08 NOTE — Telephone Encounter (Signed)
?   What paperwork is she talking about? I don't have anything for her and I don't fax the papers?

## 2013-05-11 ENCOUNTER — Ambulatory Visit (INDEPENDENT_AMBULATORY_CARE_PROVIDER_SITE_OTHER): Payer: Medicare Other | Admitting: Pharmacist

## 2013-05-11 ENCOUNTER — Telehealth: Payer: Self-pay | Admitting: Family Medicine

## 2013-05-11 DIAGNOSIS — I129 Hypertensive chronic kidney disease with stage 1 through stage 4 chronic kidney disease, or unspecified chronic kidney disease: Secondary | ICD-10-CM | POA: Diagnosis not present

## 2013-05-11 DIAGNOSIS — I509 Heart failure, unspecified: Secondary | ICD-10-CM | POA: Diagnosis not present

## 2013-05-11 DIAGNOSIS — E119 Type 2 diabetes mellitus without complications: Secondary | ICD-10-CM | POA: Diagnosis not present

## 2013-05-11 DIAGNOSIS — I5043 Acute on chronic combined systolic (congestive) and diastolic (congestive) heart failure: Secondary | ICD-10-CM | POA: Diagnosis not present

## 2013-05-11 DIAGNOSIS — I6350 Cerebral infarction due to unspecified occlusion or stenosis of unspecified cerebral artery: Secondary | ICD-10-CM

## 2013-05-11 DIAGNOSIS — N189 Chronic kidney disease, unspecified: Secondary | ICD-10-CM | POA: Diagnosis not present

## 2013-05-11 DIAGNOSIS — I2510 Atherosclerotic heart disease of native coronary artery without angina pectoris: Secondary | ICD-10-CM | POA: Diagnosis not present

## 2013-05-11 DIAGNOSIS — I4891 Unspecified atrial fibrillation: Secondary | ICD-10-CM

## 2013-05-11 DIAGNOSIS — G609 Hereditary and idiopathic neuropathy, unspecified: Secondary | ICD-10-CM

## 2013-05-11 DIAGNOSIS — I5042 Chronic combined systolic (congestive) and diastolic (congestive) heart failure: Secondary | ICD-10-CM

## 2013-05-11 DIAGNOSIS — I238 Other current complications following acute myocardial infarction: Secondary | ICD-10-CM

## 2013-05-11 LAB — POCT INR: INR: 1.8

## 2013-05-11 MED ORDER — GABAPENTIN 300 MG PO CAPS: 300.0000 mg | ORAL_CAPSULE | Freq: Two times a day (BID) | ORAL | Status: DC

## 2013-05-11 MED ORDER — TORSEMIDE 20 MG PO TABS: ORAL_TABLET | ORAL | Status: DC

## 2013-05-11 MED ORDER — LEVOTHYROXINE SODIUM 25 MCG PO TABS: 25.0000 ug | ORAL_TABLET | Freq: Every day | ORAL | Status: DC

## 2013-05-11 MED ORDER — LORAZEPAM 0.5 MG PO TABS: 0.5000 mg | ORAL_TABLET | Freq: Every day | ORAL | Status: DC

## 2013-05-11 NOTE — Telephone Encounter (Signed)
She can have a 30 d supply with 1 rf of Lorazepam, Torsemide and Levothyroxine, til seen and had labs, I need to make sure they are working. Can have a 30 d supply with 5 rf or 90 supply with 1 rf of the others. She needs labs prior or at the visit in next couple of months. If we did not order labs as she left in October she at least needs CBC, renal, TSH

## 2013-05-11 NOTE — Telephone Encounter (Signed)
Patient is requesting refills of gabapentin, torsemide, levothyroxine, and lorazepam to be sent to [facility] on [address].

## 2013-05-11 NOTE — Telephone Encounter (Signed)
Please advise refill?  If ok fax to [phone]

## 2013-05-11 NOTE — Telephone Encounter (Signed)
Notified pt and she voices understanding. Pt will complete labs at her next appointment.

## 2013-05-12 DIAGNOSIS — I509 Heart failure, unspecified: Secondary | ICD-10-CM | POA: Diagnosis not present

## 2013-05-12 DIAGNOSIS — N189 Chronic kidney disease, unspecified: Secondary | ICD-10-CM | POA: Diagnosis not present

## 2013-05-12 DIAGNOSIS — I129 Hypertensive chronic kidney disease with stage 1 through stage 4 chronic kidney disease, or unspecified chronic kidney disease: Secondary | ICD-10-CM | POA: Diagnosis not present

## 2013-05-12 DIAGNOSIS — I2510 Atherosclerotic heart disease of native coronary artery without angina pectoris: Secondary | ICD-10-CM | POA: Diagnosis not present

## 2013-05-12 DIAGNOSIS — E119 Type 2 diabetes mellitus without complications: Secondary | ICD-10-CM | POA: Diagnosis not present

## 2013-05-12 DIAGNOSIS — I5043 Acute on chronic combined systolic (congestive) and diastolic (congestive) heart failure: Secondary | ICD-10-CM | POA: Diagnosis not present

## 2013-05-15 DIAGNOSIS — I509 Heart failure, unspecified: Secondary | ICD-10-CM | POA: Diagnosis not present

## 2013-05-15 DIAGNOSIS — N189 Chronic kidney disease, unspecified: Secondary | ICD-10-CM | POA: Diagnosis not present

## 2013-05-15 DIAGNOSIS — I129 Hypertensive chronic kidney disease with stage 1 through stage 4 chronic kidney disease, or unspecified chronic kidney disease: Secondary | ICD-10-CM | POA: Diagnosis not present

## 2013-05-15 DIAGNOSIS — E119 Type 2 diabetes mellitus without complications: Secondary | ICD-10-CM | POA: Diagnosis not present

## 2013-05-15 DIAGNOSIS — I2510 Atherosclerotic heart disease of native coronary artery without angina pectoris: Secondary | ICD-10-CM | POA: Diagnosis not present

## 2013-05-15 DIAGNOSIS — I5043 Acute on chronic combined systolic (congestive) and diastolic (congestive) heart failure: Secondary | ICD-10-CM | POA: Diagnosis not present

## 2013-05-18 ENCOUNTER — Ambulatory Visit (INDEPENDENT_AMBULATORY_CARE_PROVIDER_SITE_OTHER): Payer: Medicare Other | Admitting: Cardiology

## 2013-05-18 DIAGNOSIS — I6350 Cerebral infarction due to unspecified occlusion or stenosis of unspecified cerebral artery: Secondary | ICD-10-CM

## 2013-05-18 DIAGNOSIS — I4891 Unspecified atrial fibrillation: Secondary | ICD-10-CM

## 2013-05-18 DIAGNOSIS — N189 Chronic kidney disease, unspecified: Secondary | ICD-10-CM | POA: Diagnosis not present

## 2013-05-18 DIAGNOSIS — E119 Type 2 diabetes mellitus without complications: Secondary | ICD-10-CM | POA: Diagnosis not present

## 2013-05-18 DIAGNOSIS — I2510 Atherosclerotic heart disease of native coronary artery without angina pectoris: Secondary | ICD-10-CM | POA: Diagnosis not present

## 2013-05-18 DIAGNOSIS — I238 Other current complications following acute myocardial infarction: Secondary | ICD-10-CM

## 2013-05-18 DIAGNOSIS — I509 Heart failure, unspecified: Secondary | ICD-10-CM | POA: Diagnosis not present

## 2013-05-18 DIAGNOSIS — I5043 Acute on chronic combined systolic (congestive) and diastolic (congestive) heart failure: Secondary | ICD-10-CM | POA: Diagnosis not present

## 2013-05-18 DIAGNOSIS — I129 Hypertensive chronic kidney disease with stage 1 through stage 4 chronic kidney disease, or unspecified chronic kidney disease: Secondary | ICD-10-CM | POA: Diagnosis not present

## 2013-05-18 LAB — POCT INR: INR: 2.1

## 2013-05-19 ENCOUNTER — Ambulatory Visit (HOSPITAL_COMMUNITY)
Admission: RE | Admit: 2013-05-19 | Discharge: 2013-05-19 | Disposition: A | Discharge status: Home / Self Care | Payer: Medicare Other | Source: Ambulatory Visit | Attending: Internal Medicine | Admitting: Internal Medicine

## 2013-05-19 ENCOUNTER — Encounter (HOSPITAL_COMMUNITY): Payer: Self-pay

## 2013-05-19 VITALS — BP 130/74 | HR 74 | Resp 22 | Wt 259.2 lb

## 2013-05-19 DIAGNOSIS — I10 Essential (primary) hypertension: Secondary | ICD-10-CM | POA: Diagnosis not present

## 2013-05-19 DIAGNOSIS — I5022 Chronic systolic (congestive) heart failure: Secondary | ICD-10-CM | POA: Diagnosis not present

## 2013-05-19 DIAGNOSIS — I5042 Chronic combined systolic (congestive) and diastolic (congestive) heart failure: Secondary | ICD-10-CM

## 2013-05-19 DIAGNOSIS — I2589 Other forms of chronic ischemic heart disease: Secondary | ICD-10-CM

## 2013-05-19 DIAGNOSIS — I739 Peripheral vascular disease, unspecified: Secondary | ICD-10-CM | POA: Insufficient documentation

## 2013-05-19 LAB — BASIC METABOLIC PANEL
BUN: 42 mg/dL — ABNORMAL HIGH (ref 6–23)
CALCIUM: 9.5 mg/dL (ref 8.4–10.5)
CHLORIDE: 96 meq/L (ref 96–112)
CO2: 31 meq/L (ref 19–32)
Creatinine, Ser: 1.38 mg/dL — ABNORMAL HIGH (ref 0.50–1.10)
GFR calc Af Amer: 44 mL/min — ABNORMAL LOW (ref >90)
GFR calc non Af Amer: 38 mL/min — ABNORMAL LOW (ref >90)
GLUCOSE: 213 mg/dL — AB (ref 70–99)
Potassium: 5 mEq/L (ref 3.7–5.3)
Sodium: 139 mEq/L (ref 137–147)

## 2013-05-19 MED ORDER — CARVEDILOL 12.5 MG PO TABS: 18.7500 mg | ORAL_TABLET | Freq: Two times a day (BID) | ORAL | Status: DC

## 2013-05-19 NOTE — Patient Instructions (Signed)
Follow up in 3 -4 weeks  Take carvedilol 18.75 mg twice a day  Do the following things EVERYDAY: 1) Weigh yourself in the morning before breakfast. Write it down and keep it in a log. 2) Take your medicines as prescribed 3) Eat low salt foods-Limit salt (sodium) to 2000 mg per day.  4) Stay as active as you can everyday 5) Limit all fluids for the day to less than 2 liters

## 2013-05-19 NOTE — Progress Notes (Signed)
[name], female   DOB: Dec 24, 1940, 73 y.o.   MRN: [id number] PCP: Dr Charlett Blake  HPI: Destiny [name] is a 73 y.o. female with a history of CAD, status post CABG in 2003, ischemic cardiomyopathy with variable ejection fractions noted in the past (echo in 12/2011 demonstrated normal LV function with an EF 60-65%), status post CRT-D, chronic combined systolic and diastolic CHF, paroxysmal atrial fibrillation, prior LV mural thrombus, on chronic Coumadin therapy, CKD, T2DM. She has a prior admission to the hospital for acute renal failure in the setting of rhabdomyolysis (while taking colchicine and fenofibrate). Carotid US (09/2011): Bilateral ICA stenosis 40-59%.   She was admitted 10/23-11/11/14 with a/c combined systolic and diastolic CHF. ECHO with worsening LV function with an EF of 35-40%. Hospital stay was complicated by a/c renal failure and respiratory failure. Had short term intubation. She was made a DNR/DNI.  Discharge weight 256 lbs.   She returns for follow up. Last visit carvedilol was increased coreg to 12.5 mg BID. Wears 2 liters continuously O2. Mild dyspnea off oxygen when she showers. Denies orthopnea/PND. No bleeding problems. O2 sats drop down to the mid 80s but goes back up to 97% on oxygen. Dry cough. Uses scooter outside the house. Ambulates in the house with a rollator. Uses CPAP nightly. Weigh at home 255-258 pounds. She is taking an extra 10 mg every 2-3 times a week. Followed by [facility] with RN services.   Labs  05/01/13 K 5.4 Creatinine 1.46 Pro BNP 646  05/19/13 K 5.0 Creatinine 1.38  ROS: All systems negative except as listed in HPI, PMH and Problem List.  Past Medical History  Diagnosis Date  . LBBB (left bundle branch block)   . Carotid stenosis     a. 10/19/2011 carotid duplex - Mild hard plaque bilaterally. Stable 40-59% bilateral ICA stenosis. Carotid US (04/2013):  Bilateral 40-59% ICA.  F/u 1 year  . Dyslipidemia   . Hypertension   . Chronic combined  systolic and diastolic CHF (congestive heart failure)     a. EF as low as 25% in 2006;  b. EF 60-65% in 12/2011;  c. 02/2013 Echo: EF 35-40, mod mid-dist antsept HK, Gr 2 DD, mild LVH.  . Back pain, chronic     "just when I walk; mass on 3rd and 4th vertebrae right lower back"  . Cardiomyopathy, ischemic     a. 2012 s/p SJM 3231-40 Uni BiV ICD, ser # [id number].  Marland Kitchen CAD (coronary artery disease)     a. 05/2001 CABG x4: LIMA->LAD, VG->D1, VG->D2, VG->RCA.  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 CKD (chronic kidney disease), stage III   . Biventricular implantable cardiac defibrillator in situ 2007, 2012    a. 2007;  b. 2012 Gen change: SJM 3231-40 Uni BiV ICD, ser # [id number].  Marland Kitchen Hypoxemia requiring supplemental oxygen   . Candidiasis of vulva and vagina   . Hypothyroidism   . 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 Benay Spice, in remission  . History of pneumonia 12/27/11    "3 times; it's been a long time ago"  . OSA on CPAP   . Gout ~ 08/2011  . PAF (paroxysmal atrial fibrillation)     on Coumadin  . DM (diabetes mellitus), type 2 with complications     insulin dependent, retinopathy, neuropathy  . Mural thrombus of left ventricle     before 2003, while  on Coumadin, No h/o CVA  . Morbid obesity with BMI of 45.0-49.9, adult     Ht. 5'4". BMI 47.2  . Vitamin D deficiency 06/09/2012    historical  . Olecranon bursitis of right elbow 12/29/2012  . History of recurrent UTIs     Current Outpatient Prescriptions  Medication Sig Dispense Refill  . albuterol (PROVENTIL HFA;VENTOLIN HFA) 108 (90 BASE) MCG/ACT inhaler Inhale 2 puffs into the lungs every 6 (six) hours as needed for wheezing or shortness of breath.  1 Inhaler  1  . amLODipine (NORVASC) 5 MG tablet Take 1 tablet (5 mg total) by mouth daily.  30 tablet  3  . calcium-vitamin D (OSCAL WITH D) 500-200 MG-UNIT per tablet Take 1 tablet by mouth daily with breakfast.      .  carvedilol (COREG) 12.5 MG tablet Take 1 tablet (12.5 mg total) by mouth 2 (two) times daily with a meal.  60 tablet  2  . enalapril (VASOTEC) 10 MG tablet Take 2 tablets (20 mg total) by mouth daily.  60 tablet  0  . fluticasone (FLONASE) 50 MCG/ACT nasal spray Place 2 sprays into both nostrils daily as needed for allergies or rhinitis. As needed for nasal stuffiness.    2  . gabapentin (NEURONTIN) 300 MG capsule Take 1 capsule (300 mg total) by mouth 2 (two) times daily.  180 capsule  1  . glucose blood (FREESTYLE LITE) test strip DX: 250.60  100 each  1  . insulin NPH (HUMULIN N,NOVOLIN N) 100 UNIT/ML injection Inject 25-44 Units into the skin 2 (two) times daily before a meal. Take 44 units in the morning and 25 units in the evening      . insulin regular (NOVOLIN R,HUMULIN R) 100 units/mL injection Change to 15 units three times a with meals (to cover all meals--this is the same total amount but spread out)  10 mL  12  . KRILL OIL PO Take 1 capsule by mouth daily. Mega Red      . levothyroxine (SYNTHROID, LEVOTHROID) 25 MCG tablet Take 1 tablet (25 mcg total) by mouth daily before breakfast.  30 tablet  1  . LORazepam (ATIVAN) 0.5 MG tablet Take 1 tablet (0.5 mg total) by mouth at bedtime.  30 tablet  1  . Multiple Vitamins-Minerals (MULTIVITAMIN WITH MINERALS) tablet Take 1 tablet by mouth daily.        . rosuvastatin (CRESTOR) 10 MG tablet Take 1 tablet (10 mg total) by mouth daily.  30 tablet  6  . torsemide (DEMADEX) 20 MG tablet 1 tablet daily. May take an extra tablet for 3pound weight gain, increased swelling, or shortness of breath  60 tablet  1  . traMADol (ULTRAM) 50 MG tablet Take 1 tablet (50 mg total) by mouth 2 (two) times daily as needed for pain.  60 tablet  1  . traZODone (DESYREL) 50 MG tablet Take 0.5-1 tablets (25-50 mg total) by mouth at bedtime as needed for sleep.  30 tablet  0  . Vitamin D, Ergocalciferol, (DRISDOL) 50000 UNITS CAPS capsule Take 50,000 Units by mouth every  7 (seven) days. On Tuesday      . warfarin (COUMADIN) 5 MG tablet Take two pills daily in evening.  60 tablet  1   No current facility-administered medications for this encounter.    Filed Vitals:   05/19/13 1019  BP: 130/74  Pulse: 74  Resp: 22  Weight: 259 lb 4 oz (117.595 kg)  SpO2: 94%  PHYSICAL EXAM: General:  Chronically ill appearing. No resp difficulty; on 2 L continuous. Arrived into the clinic in a scooter. Husband present  HEENT: normal Neck: supple. JVP difficult to assess d/t thick neck but does not appear elevated. Carotids 2+ bilaterally; no bruits. No lymphadenopathy or thryomegaly appreciated. Cor: PMI normal. Regular rate & rhythm. No rubs, gallops or murmurs. Lungs: clear Abdomen: Obese, soft, nontender, obese, nondistended. No hepatosplenomegaly. No bruits or masses. Good bowel sounds. Extremities: no cyanosis, clubbing, rash, trace L>R edema Neuro: alert & orientedx3, cranial nerves grossly intact. Moves all 4 extremities w/o difficulty. Affect pleasant.  ASSESSMENT & PLAN:  1) Chronic systolic HF: ICM, EF 89-21% (02/2013) s/p CRT-D ([employer]. Jude) - NYHA II-III. On continuous oxygen at 2 liters [place].  Volume status stable. Continue torsemide 20 mg daily and and extra 10 mg torsemide as needed for 3 pound weight gain. No spironolactone due to hyperkalemia.   - Will increase coreg to 18.75 mg BID.  Continue enalapril 20 mg daily, if dry cough persists will switch to ARB at next visit. Will not titrate due to hyperkalemia noted in December.   -Reinforced the need and importance of daily weights, a low sodium diet, and fluid restriction (less than 2 L a day). Instructed to call the HF clinic if weight increases more than 3 lbs overnight or 5 lbs in a week.  Check BMET today --->K 5.0 Creatinine 1.38   2) PAF- Controlled rate. Continue BB and Coumadin. No bleeding problems 3) OSA-Continue nightly CPAP  4) HTN- - As above will increase coreg for LV dysfunction. Continue  current ACE-I and norvasc. Check BMET today.   Follow up in 3-4 weeks and continue to titrate HF meds.  CLEGG,AMY NP-C  Patient seen and examined with Darrick Grinder, NP. We discussed all aspects of the encounter. I agree with the assessment and plan as stated above.   She is much improved. Volume status looks good. Reinforced need for daily weights and reviewed use of sliding scale diuretics - which she has been doing well with. Agree with titrating carvedilol. BMET today.   Ryliee Figge,MD 8:31 PM     10:26 AM

## 2013-05-22 ENCOUNTER — Ambulatory Visit (INDEPENDENT_AMBULATORY_CARE_PROVIDER_SITE_OTHER): Payer: Medicare Other | Admitting: Family Medicine

## 2013-05-22 ENCOUNTER — Encounter: Payer: Self-pay | Admitting: Family Medicine

## 2013-05-22 VITALS — BP 118/78 | HR 72 | Temp 98.1°F | Ht 64.0 in | Wt 256.1 lb

## 2013-05-22 DIAGNOSIS — T7840XD Allergy, unspecified, subsequent encounter: Secondary | ICD-10-CM

## 2013-05-22 DIAGNOSIS — J9601 Acute respiratory failure with hypoxia: Secondary | ICD-10-CM

## 2013-05-22 DIAGNOSIS — Z5189 Encounter for other specified aftercare: Secondary | ICD-10-CM | POA: Diagnosis not present

## 2013-05-22 DIAGNOSIS — J9600 Acute respiratory failure, unspecified whether with hypoxia or hypercapnia: Secondary | ICD-10-CM

## 2013-05-22 DIAGNOSIS — I5042 Chronic combined systolic (congestive) and diastolic (congestive) heart failure: Secondary | ICD-10-CM | POA: Diagnosis not present

## 2013-05-22 DIAGNOSIS — I5043 Acute on chronic combined systolic (congestive) and diastolic (congestive) heart failure: Secondary | ICD-10-CM

## 2013-05-22 DIAGNOSIS — E119 Type 2 diabetes mellitus without complications: Secondary | ICD-10-CM

## 2013-05-22 DIAGNOSIS — I10 Essential (primary) hypertension: Secondary | ICD-10-CM | POA: Diagnosis not present

## 2013-05-22 DIAGNOSIS — I509 Heart failure, unspecified: Secondary | ICD-10-CM

## 2013-05-22 DIAGNOSIS — E1149 Type 2 diabetes mellitus with other diabetic neurological complication: Secondary | ICD-10-CM

## 2013-05-22 MED ORDER — TORSEMIDE 20 MG PO TABS: ORAL_TABLET | ORAL | Status: DC

## 2013-05-22 MED ORDER — FLUTICASONE PROPIONATE 50 MCG/ACT NA SUSP
2.0000 | Freq: Every day | NASAL | Status: DC | PRN
Start: 2013-05-22 — End: 2013-05-26

## 2013-05-22 MED ORDER — GLUCOSE BLOOD VI STRP: ORAL_STRIP | Status: DC

## 2013-05-22 NOTE — Progress Notes (Signed)
Pre visit review using our clinic review tool, if applicable. No additional management support is needed unless otherwise documented below in the visit note. 

## 2013-05-22 NOTE — Patient Instructions (Signed)
Probiotic daily, Digestive Advantage, Align, generic   Viral Gastroenteritis Viral gastroenteritis is also known as stomach flu. This condition affects the stomach and intestinal tract. It can cause sudden diarrhea and vomiting. The illness typically lasts 3 to 8 days. Most people develop an immune response that eventually gets rid of the virus. While this natural response develops, the virus can make you quite ill. CAUSES  Many different viruses can cause gastroenteritis, such as rotavirus or noroviruses. You can catch one of these viruses by consuming contaminated food or water. You may also catch a virus by sharing utensils or other personal items with an infected person or by touching a contaminated surface. SYMPTOMS  The most common symptoms are diarrhea and vomiting. These problems can cause a severe loss of body fluids (dehydration) and a body salt (electrolyte) imbalance. Other symptoms may include:  Fever.  Headache.  Fatigue.  Abdominal pain. DIAGNOSIS  Your caregiver can usually diagnose viral gastroenteritis based on your symptoms and a physical exam. A stool sample may also be taken to test for the presence of viruses or other infections. TREATMENT  This illness typically goes away on its own. Treatments are aimed at rehydration. The most serious cases of viral gastroenteritis involve vomiting so severely that you are not able to keep fluids down. In these cases, fluids must be given through an intravenous line (IV). HOME CARE INSTRUCTIONS   Drink enough fluids to keep your urine clear or pale yellow. Drink small amounts of fluids frequently and increase the amounts as tolerated.  Ask your caregiver for specific rehydration instructions.  Avoid:  Foods high in sugar.  Alcohol.  Carbonated drinks.  Tobacco.  Juice.  Caffeine drinks.  Extremely hot or cold fluids.  Fatty, greasy foods.  Too much intake of anything at one time.  Dairy products until 24 to 48  hours after diarrhea stops.  You may consume probiotics. Probiotics are active cultures of beneficial bacteria. They may lessen the amount and number of diarrheal stools in adults. Probiotics can be found in yogurt with active cultures and in supplements.  Wash your hands well to avoid spreading the virus.  Only take over-the-counter or prescription medicines for pain, discomfort, or fever as directed by your caregiver. Do not give aspirin to children. Antidiarrheal medicines are not recommended.  Ask your caregiver if you should continue to take your regular prescribed and over-the-counter medicines.  Keep all follow-up appointments as directed by your caregiver. SEEK IMMEDIATE MEDICAL CARE IF:   You are unable to keep fluids down.  You do not urinate at least once every 6 to 8 hours.  You develop shortness of breath.  You notice blood in your stool or vomit. This may look like coffee grounds.  You have abdominal pain that increases or is concentrated in one small area (localized).  You have persistent vomiting or diarrhea.  You have a fever.  The patient is a child younger than 3 months, and he or she has a fever.  The patient is a child older than 3 months, and he or she has a fever and persistent symptoms.  The patient is a child older than 3 months, and he or she has a fever and symptoms suddenly get worse.  The patient is a baby, and he or she has no tears when crying. MAKE SURE YOU:   Understand these instructions.  Will watch your condition.  Will get help right away if you are not doing well or get worse. Document  Released: 04/30/2005 Document Revised: 07/23/2011 Document Reviewed: 02/14/2011 [facility] Patient Information 2014 [place], [place].

## 2013-05-24 ENCOUNTER — Encounter: Payer: Self-pay | Admitting: Family Medicine

## 2013-05-24 DIAGNOSIS — E119 Type 2 diabetes mellitus without complications: Secondary | ICD-10-CM | POA: Insufficient documentation

## 2013-05-24 NOTE — Progress Notes (Signed)
[name], female   DOB: 05/30/1940, 73 y.o.   MRN: [id number] [name] [id number] 1940-08-22 05/24/2013      Progress Note-Follow Up  Subjective  Chief Complaint  Chief Complaint  Patient presents with  . Follow-up    bp     HPI  Patient is a 73 year old Caucasian female who is in today with her husband for hospital followup. She is in home for a while now after being hospitalized for a long period of time with respiratory failure secondary to pneumonia and congestive heart failure. She feels well at this time. Is now on oxygen 24 7 and while she struggled for shortness of breath it is improving. No fevers or chills. She did have some abdominal cramping over the last few days but is improved somewhat today. Her cough is nearly resolved and she has only minor postnasal drip and occasional clear sputum production. No GU complaints.  Past Medical History  Diagnosis Date  . LBBB (left bundle branch block)   . Carotid stenosis     a. 10/19/2011 carotid duplex - Mild hard plaque bilaterally. Stable 40-59% bilateral ICA stenosis. Carotid US (04/2013):  Bilateral 40-59% ICA.  F/u 1 year  . Dyslipidemia   . Hypertension   . Chronic combined systolic and diastolic CHF (congestive heart failure)     a. EF as low as 25% in 2006;  b. EF 60-65% in 12/2011;  c. 02/2013 Echo: EF 35-40, mod mid-dist antsept HK, Gr 2 DD, mild LVH.  . Back pain, chronic     "just when I walk; mass on 3rd and 4th vertebrae right lower back"  . Cardiomyopathy, ischemic     a. 2012 s/p SJM 3231-40 Uni BiV ICD, ser # [id number].  Marland Kitchen CAD (coronary artery disease)     a. 05/2001 CABG x4: LIMA->LAD, VG->D1, VG->D2, VG->RCA.  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 CKD (chronic kidney disease), stage III   . Biventricular implantable cardiac defibrillator in situ 2007, 2012    a. 2007;  b. 2012 Gen change: SJM 3231-40 Uni BiV ICD, ser # [id number].  Marland Kitchen Hypoxemia requiring supplemental  oxygen   . Candidiasis of vulva and vagina   . Hypothyroidism   . 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 Benay Spice, in remission  . History of pneumonia 12/27/11    "3 times; it's been a long time ago"  . OSA on CPAP   . Gout ~ 08/2011  . PAF (paroxysmal atrial fibrillation)     on Coumadin  . DM (diabetes mellitus), type 2 with complications     insulin dependent, retinopathy, neuropathy  . Mural thrombus of left ventricle     before 2003, while on Coumadin, No h/o CVA  . Morbid obesity with BMI of 45.0-49.9, adult     Ht. 5'4". BMI 47.2  . Vitamin D deficiency 06/09/2012    historical  . Olecranon bursitis of right elbow 12/29/2012  . History of recurrent UTIs     Past Surgical History  Procedure Laterality Date  . Cardiac catheterization  06/03/01  . Cholecystectomy  1970  . Tubal ligation  1972  . Abdominal hysterectomy  1982  . Insert / replace / remove pacemaker  2007; 2012    w/AICD  . Coronary artery bypass graft  2003    CABG X4  . Cataract extraction      left eye  Family History  Problem Relation Age of Onset  . Kidney cancer Mother     kidney and female repo - died @ 11  . Heart disease Father     died @ 44  . Asthma Mother   . Stroke Father     History   Social History  . Marital Status: Married    Spouse Name: N/A    Number of Children: Y  . Years of Education: N/A   Occupational History  . 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    Social History Main Topics  . Smoking status: Never Smoker   . Smokeless tobacco: Never Used  . Alcohol Use: No  . Drug Use: No  . Sexual Activity: Not Currently   Other Topics Concern  . Not on file   Social History Narrative   Lives in [place] with her husband.  She does not routinely exercise or adhere to any particular diet.      Current Outpatient Prescriptions on File Prior to Visit  Medication Sig Dispense Refill  . albuterol (PROVENTIL HFA;VENTOLIN HFA) 108  (90 BASE) MCG/ACT inhaler Inhale 2 puffs into the lungs every 6 (six) hours as needed for wheezing or shortness of breath.  1 Inhaler  1  . amLODipine (NORVASC) 5 MG tablet Take 1 tablet (5 mg total) by mouth daily.  30 tablet  3  . calcium-vitamin D (OSCAL WITH D) 500-200 MG-UNIT per tablet Take 1 tablet by mouth daily with breakfast.      . carvedilol (COREG) 12.5 MG tablet Take 1.5 tablets (18.75 mg total) by mouth 2 (two) times daily with a meal.  90 tablet  3  . enalapril (VASOTEC) 10 MG tablet Take 2 tablets (20 mg total) by mouth daily.  60 tablet  0  . gabapentin (NEURONTIN) 300 MG capsule Take 1 capsule (300 mg total) by mouth 2 (two) times daily.  180 capsule  1  . insulin NPH (HUMULIN N,NOVOLIN N) 100 UNIT/ML injection Inject 25-44 Units into the skin 2 (two) times daily before a meal. Take 44 units in the morning and 25 units in the evening      . insulin regular (NOVOLIN R,HUMULIN R) 100 units/mL injection Change to 15 units three times a with meals (to cover all meals--this is the same total amount but spread out)  10 mL  12  . KRILL OIL PO Take 1 capsule by mouth daily. Mega Red      . levothyroxine (SYNTHROID, LEVOTHROID) 25 MCG tablet Take 1 tablet (25 mcg total) by mouth daily before breakfast.  30 tablet  1  . LORazepam (ATIVAN) 0.5 MG tablet Take 1 tablet (0.5 mg total) by mouth at bedtime.  30 tablet  1  . Multiple Vitamins-Minerals (MULTIVITAMIN WITH MINERALS) tablet Take 1 tablet by mouth daily.        . rosuvastatin (CRESTOR) 10 MG tablet Take 1 tablet (10 mg total) by mouth daily.  30 tablet  6  . traMADol (ULTRAM) 50 MG tablet Take 1 tablet (50 mg total) by mouth 2 (two) times daily as needed for pain.  60 tablet  1  . Vitamin D, Ergocalciferol, (DRISDOL) 50000 UNITS CAPS capsule Take 50,000 Units by mouth every 7 (seven) days. On Tuesday       No current facility-administered medications on file prior to visit.    Allergies  Allergen Reactions  . Sulfonamide  Derivatives Other (See Comments)    Unknown; "childhood allergy/mother"  Review of Systems  Review of Systems  Constitutional: Negative for fever and malaise/fatigue.  HENT: Negative for congestion.   Eyes: Negative for discharge.  Respiratory: Positive for cough and shortness of breath.   Cardiovascular: Negative for chest pain, palpitations and leg swelling.  Gastrointestinal: Negative for nausea, abdominal pain and diarrhea.  Genitourinary: Negative for dysuria.  Musculoskeletal: Negative for falls.  Skin: Negative for rash.  Neurological: Negative for loss of consciousness and headaches.  Endo/Heme/Allergies: Negative for polydipsia.  Psychiatric/Behavioral: Negative for depression and suicidal ideas. The patient is not nervous/anxious and does not have insomnia.     Objective  BP 118/78  Pulse 72  Temp(Src) 98.1 F (36.7 C) (Oral)  Ht 5\' 4"  (1.626 m)  Wt 256 lb 1.9 oz (116.175 kg)  BMI 43.94 kg/m2  SpO2 93%  Physical Exam  Physical Exam  Constitutional: She is oriented to person, place, and time and well-developed, well-nourished, and in no distress. No distress.  HENT:  Head: Normocephalic and atraumatic.  Eyes: Conjunctivae are normal.  Neck: Neck supple. No thyromegaly present.  Cardiovascular: Normal rate, regular rhythm and normal heart sounds.   Pulmonary/Chest: Effort normal and breath sounds normal. She has no wheezes.  O2 via [place]  Abdominal: She exhibits no distension and no mass.  Musculoskeletal: She exhibits no edema.  Lymphadenopathy:    She has no cervical adenopathy.  Neurological: She is alert and oriented to person, place, and time.  Skin: Skin is warm and dry. No rash noted. She is not diaphoretic.  Psychiatric: Memory, affect and judgment normal.    Lab Results  Component Value Date   TSH 1.630 03/05/2013   Lab Results  Component Value Date   WBC 7.8 04/21/2013   HGB 12.3 04/21/2013   HCT 36.7 04/21/2013   MCV 89.4 04/21/2013   PLT  200.0 04/21/2013   Lab Results  Component Value Date   CREATININE 1.38* 05/19/2013   BUN 42* 05/19/2013   NA 139 05/19/2013   K 5.0 05/19/2013   CL 96 05/19/2013   CO2 31 05/19/2013   Lab Results  Component Value Date   ALT 43* 03/25/2013   AST 40* 03/25/2013   ALKPHOS 65 03/25/2013   BILITOT 0.5 03/25/2013   Lab Results  Component Value Date   CHOL 165 09/29/2012   Lab Results  Component Value Date   HDL 38.30* 09/29/2012   Lab Results  Component Value Date   LDLCALC  Value: UNABLE TO CALCULATE IF TRIGLYCERIDE OVER 400 mg/dL        Total Cholesterol/HDL:CHD Risk Coronary Heart Disease Risk Table                     Men   Women  1/2 Average Risk   3.4   3.3  Average Risk       5.0   4.4  2 X Average Risk   9.6   7.1  3 X Average Risk  23.4   11.0        Use the calculated Patient Ratio above and the CHD Risk Table to determine the patient's CHD Risk.        ATP III CLASSIFICATION (LDL):  <100     mg/dL   Optimal  100-129  mg/dL   Near or Above                    Optimal  130-159  mg/dL   Borderline  160-189  mg/dL  High  >190     mg/dL   Very High 12/20/2009   Lab Results  Component Value Date   TRIG 103 03/18/2013   Lab Results  Component Value Date   CHOLHDL 4 09/29/2012     Assessment & Plan  HYPERTENSION Well controlled, no changes  Acute respiratory failure with hypoxia Recently hospitalized but doing well since returning home. Is on oxygen 24 hours a day. Is encouraged to follow up with pulmonology  Acute on chronic combined systolic and diastolic heart failure Is following closely with carediology and symptomatically improved.   DIABETES MELLITUS, WITH AUTONOMIC NEUROPATHY Well controlled recently per patient follows up with endocrinology, is given rx for freestyle lite test strips, check blood sugars bid and prn.

## 2013-05-24 NOTE — Assessment & Plan Note (Signed)
Recently hospitalized but doing well since returning home. Is on oxygen 24 hours a day. Is encouraged to follow up with pulmonology

## 2013-05-24 NOTE — Assessment & Plan Note (Signed)
Well controlled, no changes 

## 2013-05-24 NOTE — Assessment & Plan Note (Signed)
Is following closely with [facility]iology and symptomatically improved.

## 2013-05-24 NOTE — Assessment & Plan Note (Addendum)
Well controlled recently per patient follows up with endocrinology, is given rx for freestyle lite test strips, check blood sugars bid and prn.

## 2013-05-25 ENCOUNTER — Telehealth: Payer: Self-pay | Admitting: Family Medicine

## 2013-05-25 ENCOUNTER — Ambulatory Visit (INDEPENDENT_AMBULATORY_CARE_PROVIDER_SITE_OTHER): Payer: Medicare Other | Admitting: Pharmacist

## 2013-05-25 DIAGNOSIS — N189 Chronic kidney disease, unspecified: Secondary | ICD-10-CM | POA: Diagnosis not present

## 2013-05-25 DIAGNOSIS — I2510 Atherosclerotic heart disease of native coronary artery without angina pectoris: Secondary | ICD-10-CM | POA: Diagnosis not present

## 2013-05-25 DIAGNOSIS — I509 Heart failure, unspecified: Secondary | ICD-10-CM | POA: Diagnosis not present

## 2013-05-25 DIAGNOSIS — I4891 Unspecified atrial fibrillation: Secondary | ICD-10-CM

## 2013-05-25 DIAGNOSIS — I6350 Cerebral infarction due to unspecified occlusion or stenosis of unspecified cerebral artery: Secondary | ICD-10-CM

## 2013-05-25 DIAGNOSIS — I238 Other current complications following acute myocardial infarction: Secondary | ICD-10-CM

## 2013-05-25 DIAGNOSIS — I5043 Acute on chronic combined systolic (congestive) and diastolic (congestive) heart failure: Secondary | ICD-10-CM | POA: Diagnosis not present

## 2013-05-25 DIAGNOSIS — E119 Type 2 diabetes mellitus without complications: Secondary | ICD-10-CM | POA: Diagnosis not present

## 2013-05-25 DIAGNOSIS — I129 Hypertensive chronic kidney disease with stage 1 through stage 4 chronic kidney disease, or unspecified chronic kidney disease: Secondary | ICD-10-CM | POA: Diagnosis not present

## 2013-05-25 DIAGNOSIS — I10 Essential (primary) hypertension: Secondary | ICD-10-CM

## 2013-05-25 LAB — POCT INR: INR: 2.2

## 2013-05-25 NOTE — Telephone Encounter (Signed)
Patient also states that [facility] says that they did not get the flonase refill

## 2013-05-25 NOTE — Telephone Encounter (Signed)
Patient husband called back stating that patient has been out of enalapril since last week and would like this and flonase refilled

## 2013-05-25 NOTE — Telephone Encounter (Signed)
Request refill on enalapril (VASOTEC) 10 MG tablet, please call into [facility] on [address]

## 2013-05-26 ENCOUNTER — Telehealth: Payer: Self-pay

## 2013-05-26 ENCOUNTER — Telehealth: Payer: Self-pay | Admitting: Family Medicine

## 2013-05-26 MED ORDER — FLUTICASONE PROPIONATE 50 MCG/ACT NA SUSP: 2.0000 | Freq: Every day | NASAL | Status: DC | PRN

## 2013-05-26 MED ORDER — ENALAPRIL MALEATE 10 MG PO TABS: 20.0000 mg | ORAL_TABLET | Freq: Every day | ORAL | Status: DC

## 2013-05-26 NOTE — Telephone Encounter (Signed)
Informed patient that nurse will look into this this afternoon and he is requesting a call when these have been sent. Best # [phone]. [facility] on [address]

## 2013-05-26 NOTE — Telephone Encounter (Signed)
Opened in error

## 2013-05-26 NOTE — Telephone Encounter (Signed)
Relevant patient education assigned to patient using Emmi. ° °

## 2013-06-02 ENCOUNTER — Telehealth: Payer: Self-pay | Admitting: Family Medicine

## 2013-06-02 MED ORDER — AMLODIPINE BESYLATE 5 MG PO TABS
5.0000 mg | ORAL_TABLET | Freq: Every day | ORAL | Status: DC
Start: 2013-06-02 — End: 2013-09-28

## 2013-06-02 NOTE — Telephone Encounter (Signed)
Patient is requesting a new prescription of amlodipine to be sent to [facility] on [address]

## 2013-06-02 NOTE — Telephone Encounter (Signed)
Refill sent, notified pt. 

## 2013-06-03 DIAGNOSIS — M545 Low back pain, unspecified: Secondary | ICD-10-CM | POA: Diagnosis not present

## 2013-06-03 DIAGNOSIS — M25559 Pain in unspecified hip: Secondary | ICD-10-CM | POA: Diagnosis not present

## 2013-06-03 DIAGNOSIS — M76899 Other specified enthesopathies of unspecified lower limb, excluding foot: Secondary | ICD-10-CM | POA: Diagnosis not present

## 2013-06-09 ENCOUNTER — Other Ambulatory Visit: Payer: Self-pay | Admitting: Family Medicine

## 2013-06-09 NOTE — Telephone Encounter (Signed)
Tramadol last refilled 12/26/12, #60 x 1 refill.  Vitamin D last refilled 12/26/12, #4 x 3 refills.  Please advise if ok to give refills on both and how many?

## 2013-06-10 ENCOUNTER — Ambulatory Visit (HOSPITAL_COMMUNITY)
Admission: RE | Admit: 2013-06-10 | Discharge: 2013-06-10 | Disposition: A | Discharge status: Home / Self Care | Payer: Medicare Other | Source: Ambulatory Visit | Attending: Internal Medicine | Admitting: Internal Medicine

## 2013-06-10 ENCOUNTER — Ambulatory Visit (INDEPENDENT_AMBULATORY_CARE_PROVIDER_SITE_OTHER): Payer: Medicare Other | Admitting: Pharmacist

## 2013-06-10 VITALS — BP 117/55 | HR 68 | Resp 18 | Wt 256.2 lb

## 2013-06-10 DIAGNOSIS — E11319 Type 2 diabetes mellitus with unspecified diabetic retinopathy without macular edema: Secondary | ICD-10-CM | POA: Insufficient documentation

## 2013-06-10 DIAGNOSIS — Z5181 Encounter for therapeutic drug level monitoring: Secondary | ICD-10-CM | POA: Diagnosis not present

## 2013-06-10 DIAGNOSIS — Z79899 Other long term (current) drug therapy: Secondary | ICD-10-CM | POA: Insufficient documentation

## 2013-06-10 DIAGNOSIS — G4733 Obstructive sleep apnea (adult) (pediatric): Secondary | ICD-10-CM | POA: Insufficient documentation

## 2013-06-10 DIAGNOSIS — Z87898 Personal history of other specified conditions: Secondary | ICD-10-CM | POA: Insufficient documentation

## 2013-06-10 DIAGNOSIS — E1142 Type 2 diabetes mellitus with diabetic polyneuropathy: Secondary | ICD-10-CM | POA: Insufficient documentation

## 2013-06-10 DIAGNOSIS — I5022 Chronic systolic (congestive) heart failure: Secondary | ICD-10-CM | POA: Diagnosis not present

## 2013-06-10 DIAGNOSIS — Z7901 Long term (current) use of anticoagulants: Secondary | ICD-10-CM | POA: Insufficient documentation

## 2013-06-10 DIAGNOSIS — I10 Essential (primary) hypertension: Secondary | ICD-10-CM

## 2013-06-10 DIAGNOSIS — N183 Chronic kidney disease, stage 3 unspecified: Secondary | ICD-10-CM | POA: Insufficient documentation

## 2013-06-10 DIAGNOSIS — Z95810 Presence of automatic (implantable) cardiac defibrillator: Secondary | ICD-10-CM | POA: Insufficient documentation

## 2013-06-10 DIAGNOSIS — E039 Hypothyroidism, unspecified: Secondary | ICD-10-CM | POA: Diagnosis not present

## 2013-06-10 DIAGNOSIS — E1339 Other specified diabetes mellitus with other diabetic ophthalmic complication: Secondary | ICD-10-CM | POA: Insufficient documentation

## 2013-06-10 DIAGNOSIS — I509 Heart failure, unspecified: Secondary | ICD-10-CM | POA: Diagnosis not present

## 2013-06-10 DIAGNOSIS — I6350 Cerebral infarction due to unspecified occlusion or stenosis of unspecified cerebral artery: Secondary | ICD-10-CM

## 2013-06-10 DIAGNOSIS — I4891 Unspecified atrial fibrillation: Secondary | ICD-10-CM | POA: Insufficient documentation

## 2013-06-10 DIAGNOSIS — I129 Hypertensive chronic kidney disease with stage 1 through stage 4 chronic kidney disease, or unspecified chronic kidney disease: Secondary | ICD-10-CM | POA: Diagnosis not present

## 2013-06-10 DIAGNOSIS — I2510 Atherosclerotic heart disease of native coronary artery without angina pectoris: Secondary | ICD-10-CM | POA: Insufficient documentation

## 2013-06-10 DIAGNOSIS — Z951 Presence of aortocoronary bypass graft: Secondary | ICD-10-CM | POA: Diagnosis not present

## 2013-06-10 DIAGNOSIS — I2589 Other forms of chronic ischemic heart disease: Secondary | ICD-10-CM | POA: Diagnosis not present

## 2013-06-10 DIAGNOSIS — Z6841 Body Mass Index (BMI) 40.0 and over, adult: Secondary | ICD-10-CM | POA: Diagnosis not present

## 2013-06-10 DIAGNOSIS — I238 Other current complications following acute myocardial infarction: Secondary | ICD-10-CM | POA: Diagnosis not present

## 2013-06-10 DIAGNOSIS — E1349 Other specified diabetes mellitus with other diabetic neurological complication: Secondary | ICD-10-CM | POA: Diagnosis not present

## 2013-06-10 DIAGNOSIS — I5042 Chronic combined systolic (congestive) and diastolic (congestive) heart failure: Secondary | ICD-10-CM | POA: Diagnosis not present

## 2013-06-10 DIAGNOSIS — Z794 Long term (current) use of insulin: Secondary | ICD-10-CM | POA: Insufficient documentation

## 2013-06-10 LAB — POCT INR: INR: 1.8

## 2013-06-10 MED ORDER — CARVEDILOL 25 MG PO TABS: 25.0000 mg | ORAL_TABLET | Freq: Two times a day (BID) | ORAL | Status: DC

## 2013-06-10 NOTE — Progress Notes (Signed)
[name], female   DOB: May 15, 1940, 73 y.o.   MRN: [id number]  PCP: Dr Charlett Blake  HPI: Destiny [name] is a 73 y.o. female with a history of CAD, status post CABG in 2003, ischemic cardiomyopathy with variable ejection fractions noted in the past (echo in 12/2011 demonstrated normal LV function with an EF 60-65%), status post CRT-D, chronic combined systolic and diastolic CHF, paroxysmal atrial fibrillation, prior LV mural thrombus, on chronic Coumadin therapy, CKD, T2DM. She has a prior admission to the hospital for acute renal failure in the setting of rhabdomyolysis (while taking colchicine and fenofibrate). Carotid US (09/2011): Bilateral ICA stenosis 40-59%.   She was admitted 10/23-11/11/14 with a/c combined systolic and diastolic CHF. ECHO with worsening LV function with an EF of 35-40%. Hospital stay was complicated by a/c renal failure and respiratory failure. Had short term intubation. She was made a DNR/DNI.  Discharge weight 256 lbs.   Follow up: Last visit increased carvedilol to 18.75 mg BID, which she tolerated. Cough improved. Wears 2L continuous O2. Denies SOB, orthopnea, CP or edema. NO BRBPR or melena. Still using the scooter when shopping. Uses rollator in the house. CPAP nightly. Weight at home 253-255 lbs. Has not needed any extra torsemide in 5 days. Has R hip pain and that is why she uses scooter out and about.   Labs  05/01/13 K 5.4 Creatinine 1.46 Pro BNP 646  05/19/13 K 5.0 Creatinine 1.38  ROS: All systems negative except as listed in HPI, PMH and Problem List.  Past Medical History  Diagnosis Date  . LBBB (left bundle branch block)   . Carotid stenosis     a. 10/19/2011 carotid duplex - Mild hard plaque bilaterally. Stable 40-59% bilateral ICA stenosis. Carotid US (04/2013):  Bilateral 40-59% ICA.  F/u 1 year  . Dyslipidemia   . Hypertension   . Chronic combined systolic and diastolic CHF (congestive heart failure)     a. EF as low as 25% in 2006;  b. EF  60-65% in 12/2011;  c. 02/2013 Echo: EF 35-40, mod mid-dist antsept HK, Gr 2 DD, mild LVH.  . Back pain, chronic     "just when I walk; mass on 3rd and 4th vertebrae right lower back"  . Cardiomyopathy, ischemic     a. 2012 s/p SJM 3231-40 Uni BiV ICD, ser # [id number].  Marland Kitchen CAD (coronary artery disease)     a. 05/2001 CABG x4: LIMA->LAD, VG->D1, VG->D2, VG->RCA.  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 CKD (chronic kidney disease), stage III   . Biventricular implantable cardiac defibrillator in situ 2007, 2012    a. 2007;  b. 2012 Gen change: SJM 3231-40 Uni BiV ICD, ser # [id number].  Marland Kitchen Hypoxemia requiring supplemental oxygen   . Candidiasis of vulva and vagina   . Hypothyroidism   . 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 Benay Spice, in remission  . History of pneumonia 12/27/11    "3 times; it's been a long time ago"  . OSA on CPAP   . Gout ~ 08/2011  . PAF (paroxysmal atrial fibrillation)     on Coumadin  . DM (diabetes mellitus), type 2 with complications     insulin dependent, retinopathy, neuropathy  . Mural thrombus of left ventricle     before 2003, while on Coumadin, No h/o CVA  . Morbid obesity with BMI of 45.0-49.9, adult  Ht. 5'4". BMI 47.2  . Vitamin D deficiency 06/09/2012    historical  . Olecranon bursitis of right elbow 12/29/2012  . History of recurrent UTIs     Current Outpatient Prescriptions  Medication Sig Dispense Refill  . albuterol (PROVENTIL HFA;VENTOLIN HFA) 108 (90 BASE) MCG/ACT inhaler Inhale 2 puffs into the lungs every 6 (six) hours as needed for wheezing or shortness of breath.  1 Inhaler  1  . amLODipine (NORVASC) 5 MG tablet Take 1 tablet (5 mg total) by mouth daily.  30 tablet  3  . calcium-vitamin D (OSCAL WITH D) 500-200 MG-UNIT per tablet Take 1 tablet by mouth daily with breakfast.      . carvedilol (COREG) 12.5 MG tablet Take 1.5 tablets (18.75 mg total) by mouth 2 (two) times daily  with a meal.  90 tablet  3  . enalapril (VASOTEC) 10 MG tablet Take 2 tablets (20 mg total) by mouth daily.  60 tablet  3  . fluticasone (FLONASE) 50 MCG/ACT nasal spray Place 2 sprays into both nostrils daily as needed for allergies or rhinitis. As needed for nasal stuffiness.  16 g  2  . gabapentin (NEURONTIN) 300 MG capsule Take 1 capsule (300 mg total) by mouth 2 (two) times daily.  180 capsule  1  . glucose blood (FREESTYLE LITE) test strip DX: 250.60  Check sugars bid and as needed  100 each  2  . insulin NPH (HUMULIN N,NOVOLIN N) 100 UNIT/ML injection Inject 25-44 Units into the skin 2 (two) times daily before a meal. Take 44 units in the morning and 25 units in the evening      . insulin regular (NOVOLIN R,HUMULIN R) 100 units/mL injection Change to 15 units three times a with meals (to cover all meals--this is the same total amount but spread out)  10 mL  12  . KRILL OIL PO Take 1 capsule by mouth daily. Mega Red      . levothyroxine (SYNTHROID, LEVOTHROID) 25 MCG tablet Take 1 tablet (25 mcg total) by mouth daily before breakfast.  30 tablet  1  . LORazepam (ATIVAN) 0.5 MG tablet Take 1 tablet (0.5 mg total) by mouth at bedtime.  30 tablet  1  . Multiple Vitamins-Minerals (MULTIVITAMIN WITH MINERALS) tablet Take 1 tablet by mouth daily.        . rosuvastatin (CRESTOR) 10 MG tablet Take 1 tablet (10 mg total) by mouth daily.  30 tablet  6  . torsemide (DEMADEX) 20 MG tablet 1 tablet daily. May take an extra tablet for 3pound weight gain, increased swelling, or shortness of breath  60 tablet  2  . traMADol (ULTRAM) 50 MG tablet TAKE ONE TABLET BY MOUTH TWICE DAILY AS NEEDED FOR PAIN  60 tablet  0  . Vitamin D, Ergocalciferol, (DRISDOL) 50000 UNITS CAPS capsule Take 50,000 Units by mouth every 7 (seven) days. On Tuesday      . Vitamin D, Ergocalciferol, (DRISDOL) 50000 UNITS CAPS capsule TAKE ONE CAPSULE BY MOUTH ONCE A WEEK  4 capsule  0  . warfarin (COUMADIN) 5 MG tablet 7.5 mg. 7.5 mg  everyday except Wed. Take 5 mg on Wed       No current facility-administered medications for this encounter.    Filed Vitals:   06/10/13 1003  BP: 117/55  Pulse: 68  Resp: 18  Weight: 256 lb 4 oz (116.234 kg)  SpO2: 92%    PHYSICAL EXAM: General:  Chronically ill appearing. No resp difficulty; on  2 L continuous. Husband present  HEENT: normal Neck: supple. JVP difficult to assess d/t thick neck but does not appear elevated. Carotids 2+ bilaterally; no bruits. No lymphadenopathy or thryomegaly appreciated. Cor: PMI normal. Regular rate & rhythm. No rubs, gallops or murmurs. Lungs: clear Abdomen: Obese, soft, nontender, obese, nondistended. No hepatosplenomegaly. No bruits or masses. Good bowel sounds. Extremities: no cyanosis, clubbing, rash, no edema Neuro: alert & orientedx3, cranial nerves grossly intact. Moves all 4 extremities w/o difficulty. Affect pleasant.  ASSESSMENT & PLAN:  1) Chronic systolic HF: ICM, EF 35-00% (02/2013) s/p CRT-D ([employer]) - NYHA II symptoms and volume status stable. Will continue torsemide 20 mg daily with an extra 10 mg as needed for 3 pound weight gain.  - Will increase coreg to 25 mg BID. Call any dizziness. - Continue enalapril 20 mg daily, cough improved. Will not titrate with hyperkalemia in the past. No spiro either. -Reinforced the need and importance of daily weights, a low sodium diet, and fluid restriction (less than 2 L a day). Instructed to call the HF clinic if weight increases more than 3 lbs overnight or 5 lbs in a week. 2) PAF- Appears to be in NSR today, regular. Continue BB and Coumadin. No bleeding problems 3) OSA-Continue nightly CPAP  4) HTN- - As above will increase coreg for LV dysfunction. Continue current ACE-I and norvasc. Check BMET today.   F/U 3 months with ECHO  Junie Bame B NP-C

## 2013-06-10 NOTE — Patient Instructions (Signed)
Increase your coreg to 25 mg twice a day.  Follow up 3 months with ECHO.  Do the following things EVERYDAY: 1) Weigh yourself in the morning before breakfast. Write it down and keep it in a log. 2) Take your medicines as prescribed 3) Eat low salt foods-Limit salt (sodium) to 2000 mg per day.  4) Stay as active as you can everyday 5) Limit all fluids for the day to less than 2 liters 6)

## 2013-06-25 ENCOUNTER — Ambulatory Visit (INDEPENDENT_AMBULATORY_CARE_PROVIDER_SITE_OTHER): Payer: Medicare Other | Admitting: *Deleted

## 2013-06-25 DIAGNOSIS — I4891 Unspecified atrial fibrillation: Secondary | ICD-10-CM

## 2013-06-25 DIAGNOSIS — Z5181 Encounter for therapeutic drug level monitoring: Secondary | ICD-10-CM

## 2013-06-25 DIAGNOSIS — I6350 Cerebral infarction due to unspecified occlusion or stenosis of unspecified cerebral artery: Secondary | ICD-10-CM

## 2013-06-25 DIAGNOSIS — I238 Other current complications following acute myocardial infarction: Secondary | ICD-10-CM

## 2013-06-25 LAB — POCT INR: INR: 1.7

## 2013-06-30 ENCOUNTER — Ambulatory Visit: Payer: Medicare Other | Admitting: Family Medicine

## 2013-07-07 ENCOUNTER — Ambulatory Visit: Payer: Medicare Other | Admitting: Family Medicine

## 2013-07-08 ENCOUNTER — Ambulatory Visit (INDEPENDENT_AMBULATORY_CARE_PROVIDER_SITE_OTHER): Payer: Medicare Other | Admitting: *Deleted

## 2013-07-08 ENCOUNTER — Ambulatory Visit (INDEPENDENT_AMBULATORY_CARE_PROVIDER_SITE_OTHER): Payer: Medicare Other | Admitting: Pharmacist

## 2013-07-08 ENCOUNTER — Encounter: Payer: Self-pay | Admitting: Internal Medicine

## 2013-07-08 DIAGNOSIS — I5022 Chronic systolic (congestive) heart failure: Secondary | ICD-10-CM

## 2013-07-08 DIAGNOSIS — I255 Ischemic cardiomyopathy: Secondary | ICD-10-CM

## 2013-07-08 DIAGNOSIS — I5042 Chronic combined systolic (congestive) and diastolic (congestive) heart failure: Secondary | ICD-10-CM

## 2013-07-08 DIAGNOSIS — Z5181 Encounter for therapeutic drug level monitoring: Secondary | ICD-10-CM | POA: Diagnosis not present

## 2013-07-08 DIAGNOSIS — I447 Left bundle-branch block, unspecified: Secondary | ICD-10-CM | POA: Diagnosis not present

## 2013-07-08 DIAGNOSIS — I238 Other current complications following acute myocardial infarction: Secondary | ICD-10-CM

## 2013-07-08 DIAGNOSIS — I4891 Unspecified atrial fibrillation: Secondary | ICD-10-CM | POA: Diagnosis not present

## 2013-07-08 DIAGNOSIS — I2589 Other forms of chronic ischemic heart disease: Secondary | ICD-10-CM | POA: Diagnosis not present

## 2013-07-08 DIAGNOSIS — I6350 Cerebral infarction due to unspecified occlusion or stenosis of unspecified cerebral artery: Secondary | ICD-10-CM | POA: Diagnosis not present

## 2013-07-08 LAB — MDC_IDC_ENUM_SESS_TYPE_REMOTE
Battery Remaining Percentage: 57 %
Brady Statistic AP VP Percent: 51 %
Brady Statistic AP VS Percent: 1 %
Brady Statistic AS VP Percent: 49 %
Brady Statistic RA Percent Paced: 50 %
HIGH POWER IMPEDANCE MEASURED VALUE: 48 Ohm
Lead Channel Impedance Value: 310 Ohm
Lead Channel Impedance Value: 390 Ohm
Lead Channel Pacing Threshold Amplitude: 0.75 V
Lead Channel Pacing Threshold Pulse Width: 0.5 ms
Lead Channel Pacing Threshold Pulse Width: 0.5 ms
Lead Channel Sensing Intrinsic Amplitude: 2.2 mV
Lead Channel Setting Sensing Sensitivity: 0.5 mV
MDC IDC MSMT BATTERY REMAINING LONGEVITY: 42 mo
MDC IDC MSMT BATTERY VOLTAGE: 2.93 V
MDC IDC MSMT LEADCHNL LV IMPEDANCE VALUE: 290 Ohm
MDC IDC MSMT LEADCHNL LV PACING THRESHOLD AMPLITUDE: 1.25 V
MDC IDC MSMT LEADCHNL LV PACING THRESHOLD PULSEWIDTH: 0.6 ms
MDC IDC MSMT LEADCHNL RV PACING THRESHOLD AMPLITUDE: 0.625 V
MDC IDC MSMT LEADCHNL RV SENSING INTR AMPL: 11.7 mV
MDC IDC PG SERIAL: 631685
MDC IDC SESS DTM: 20150225135558
MDC IDC SET LEADCHNL LV PACING AMPLITUDE: 2.25 V
MDC IDC SET LEADCHNL LV PACING PULSEWIDTH: 0.6 ms
MDC IDC SET LEADCHNL RA PACING AMPLITUDE: 1.75 V
MDC IDC SET LEADCHNL RV PACING AMPLITUDE: 2.5 V
MDC IDC SET LEADCHNL RV PACING PULSEWIDTH: 0.5 ms
MDC IDC SET ZONE DETECTION INTERVAL: 310 ms
MDC IDC STAT BRADY AS VS PERCENT: 1 %
Zone Setting Detection Interval: 250 ms
Zone Setting Detection Interval: 345 ms

## 2013-07-08 LAB — POCT INR: INR: 1.8

## 2013-07-08 MED ORDER — WARFARIN SODIUM 5 MG PO TABS: 7.5000 mg | ORAL_TABLET | ORAL | Status: DC

## 2013-07-11 ENCOUNTER — Other Ambulatory Visit: Payer: Self-pay | Admitting: Family Medicine

## 2013-07-13 NOTE — Telephone Encounter (Signed)
Refill request for Lorazepam Last filled by MD on - 05/11/13 #30 x1 Last Appt: 05/22/2013 Next Appt: 07/27/2013  Refill request for tramadol Last filled by MD on - 06/10/2013 #60 x0 Please advise refills?

## 2013-07-16 ENCOUNTER — Ambulatory Visit (HOSPITAL_BASED_OUTPATIENT_CLINIC_OR_DEPARTMENT_OTHER): Payer: Medicare Other | Admitting: Oncology

## 2013-07-16 VITALS — BP 127/49 | HR 62 | Temp 98.3°F | Resp 19 | Ht 64.0 in | Wt 257.3 lb

## 2013-07-16 DIAGNOSIS — G609 Hereditary and idiopathic neuropathy, unspecified: Secondary | ICD-10-CM | POA: Diagnosis not present

## 2013-07-16 DIAGNOSIS — C8595 Non-Hodgkin lymphoma, unspecified, lymph nodes of inguinal region and lower limb: Secondary | ICD-10-CM | POA: Diagnosis not present

## 2013-07-16 DIAGNOSIS — C8589 Other specified types of non-Hodgkin lymphoma, extranodal and solid organ sites: Secondary | ICD-10-CM

## 2013-07-16 NOTE — Progress Notes (Signed)
   Destiny [name]    OFFICE PROGRESS NOTE   INTERVAL HISTORY:   She had a lengthy hospital admission last fall with congestive heart failure and respiratory failure. She is now maintained on home oxygen and uses a walker to ambulate. No fever or night sweats. No palpable lymph nodes.  Objective:  Vital signs in last 24 hours:  Blood pressure 127/49, pulse 62, temperature 98.3 F (36.8 C), temperature source Oral, resp. rate 19, height 5\' 4"  (1.626 m), weight 257 lb 4.8 oz (116.711 kg), SpO2 98.00%.    HEENT: Neck without mass Lymphatics: No cervical, supraclavicular, axillary, or inguinal nodes. Left thigh without mass. Resp: Lungs clear bilaterally Cardio: Regular rate and rhythm GI: No hepatosplenomegaly Vascular: No leg edema   Lab Results:  Lab Results  Component Value Date   WBC 7.8 04/21/2013   HGB 12.3 04/21/2013   HCT 36.7 04/21/2013   MCV 89.4 04/21/2013   PLT 200.0 04/21/2013   NEUTROABS 4.9 04/21/2013      Medications: I have reviewed the patient's current medications.  Assessment/Plan: 1. Non-Hodgkin's lymphoma, high-grade B-cell lymphoma, involving left inguinal mass, status post core biopsy 03/21/2009. Staging PET scan on 03/16/2009 showed a left inguinal nodal conglomerate measuring 7 x 6.3 cm with intense hypermetabolic activity with additional hypermetabolic tumor and mass-like swelling within the muscle groups extending to the left knee with a permeative appearance of the distal femoral metaphysis and femoral condyles. She completed 6 cycles of CHOP (Adriamycin was deleted and etoposide substituted) and rituximab with marked clinical and radiographic improvement. 2. Hospitalization 11/25 through 04/10/2009 with acute exacerbation of diastolic congestive heart failure, likely secondary to Lasix being held and intravenous fluids given with cycle 1 of chemotherapy. 3. Ischemic cardiomyopathy status post implantation of a biventricular ICD 08/02/2005  with improvement in the ejection fraction. 4. Insulin-dependent diabetes. 5. Hypertension. 6. Hypercholesterolemia.  7. Hypertriglyceridemia. 8. Sleep apnea on CPAP and home oxygen. 9. History of peripheral neuropathy, unchanged following chemotherapy. 10. Chronic low back pain followed by Dr. Nelva Bush. 11. Hospitalization September 2013 with acute CHF and cellulitis. 12. Admission with heart failure/ respiratory failure October 2014  Disposition:  Destiny [name] remains in clinical remission from non-Hodgkin's lymphoma. She will return for an office visit in one year. She will contact us in the interim as needed.   Betsy Coder, MD  07/16/2013  2:07 PM

## 2013-07-17 ENCOUNTER — Telehealth: Payer: Self-pay | Admitting: Oncology

## 2013-07-17 NOTE — Telephone Encounter (Signed)
s.w. pt and advisedon March 2016 appt....pt ok and aware °

## 2013-07-22 ENCOUNTER — Ambulatory Visit (INDEPENDENT_AMBULATORY_CARE_PROVIDER_SITE_OTHER): Payer: Medicare Other | Admitting: Pharmacist

## 2013-07-22 DIAGNOSIS — I238 Other current complications following acute myocardial infarction: Secondary | ICD-10-CM

## 2013-07-22 DIAGNOSIS — Z5181 Encounter for therapeutic drug level monitoring: Secondary | ICD-10-CM | POA: Diagnosis not present

## 2013-07-22 DIAGNOSIS — I4891 Unspecified atrial fibrillation: Secondary | ICD-10-CM

## 2013-07-22 DIAGNOSIS — I6350 Cerebral infarction due to unspecified occlusion or stenosis of unspecified cerebral artery: Secondary | ICD-10-CM | POA: Diagnosis not present

## 2013-07-22 LAB — POCT INR: INR: 2.3

## 2013-07-27 ENCOUNTER — Encounter: Payer: Self-pay | Admitting: Family Medicine

## 2013-07-27 ENCOUNTER — Ambulatory Visit (INDEPENDENT_AMBULATORY_CARE_PROVIDER_SITE_OTHER): Payer: Medicare Other

## 2013-07-27 ENCOUNTER — Ambulatory Visit (INDEPENDENT_AMBULATORY_CARE_PROVIDER_SITE_OTHER): Payer: Medicare Other | Admitting: Family Medicine

## 2013-07-27 VITALS — BP 128/64 | HR 61 | Temp 98.1°F | Ht 64.0 in | Wt 255.1 lb

## 2013-07-27 DIAGNOSIS — I10 Essential (primary) hypertension: Secondary | ICD-10-CM

## 2013-07-27 DIAGNOSIS — F329 Major depressive disorder, single episode, unspecified: Secondary | ICD-10-CM

## 2013-07-27 DIAGNOSIS — F419 Anxiety disorder, unspecified: Principal | ICD-10-CM

## 2013-07-27 DIAGNOSIS — E1149 Type 2 diabetes mellitus with other diabetic neurological complication: Secondary | ICD-10-CM | POA: Diagnosis not present

## 2013-07-27 DIAGNOSIS — F341 Dysthymic disorder: Secondary | ICD-10-CM | POA: Diagnosis not present

## 2013-07-27 DIAGNOSIS — C8589 Other specified types of non-Hodgkin lymphoma, extranodal and solid organ sites: Secondary | ICD-10-CM | POA: Diagnosis not present

## 2013-07-27 DIAGNOSIS — I509 Heart failure, unspecified: Secondary | ICD-10-CM | POA: Diagnosis not present

## 2013-07-27 DIAGNOSIS — N289 Disorder of kidney and ureter, unspecified: Secondary | ICD-10-CM

## 2013-07-27 DIAGNOSIS — I4891 Unspecified atrial fibrillation: Secondary | ICD-10-CM

## 2013-07-27 DIAGNOSIS — I2589 Other forms of chronic ischemic heart disease: Secondary | ICD-10-CM | POA: Diagnosis not present

## 2013-07-27 DIAGNOSIS — E559 Vitamin D deficiency, unspecified: Secondary | ICD-10-CM | POA: Diagnosis not present

## 2013-07-27 DIAGNOSIS — E785 Hyperlipidemia, unspecified: Secondary | ICD-10-CM

## 2013-07-27 DIAGNOSIS — D649 Anemia, unspecified: Secondary | ICD-10-CM

## 2013-07-27 LAB — LIPID PANEL
Cholesterol: 164 mg/dL (ref 0–200)
HDL: 37.6 mg/dL — ABNORMAL LOW (ref >39.00)
LDL CALC: 41 mg/dL (ref 0–99)
Total CHOL/HDL Ratio: 4
Triglycerides: 428 mg/dL — ABNORMAL HIGH (ref 0.0–149.0)
VLDL: 85.6 mg/dL — ABNORMAL HIGH (ref 0.0–40.0)

## 2013-07-27 LAB — CBC
HCT: 35.3 % — ABNORMAL LOW (ref 36.0–46.0)
Hemoglobin: 11.9 g/dL — ABNORMAL LOW (ref 12.0–15.0)
MCHC: 33.8 g/dL (ref 30.0–36.0)
MCV: 87.1 fl (ref 78.0–100.0)
Platelets: 212 10*3/uL (ref 150.0–400.0)
RBC: 4.06 Mil/uL (ref 3.87–5.11)
RDW: 13.8 % (ref 11.5–14.6)
WBC: 7.9 10*3/uL (ref 4.5–10.5)

## 2013-07-27 LAB — HEPATIC FUNCTION PANEL
ALK PHOS: 60 U/L (ref 39–117)
ALT: 17 U/L (ref 0–35)
AST: 18 U/L (ref 0–37)
Albumin: 3.8 g/dL (ref 3.5–5.2)
Bilirubin, Direct: 0 mg/dL (ref 0.0–0.3)
TOTAL PROTEIN: 7.2 g/dL (ref 6.0–8.3)
Total Bilirubin: 0.4 mg/dL (ref 0.3–1.2)

## 2013-07-27 LAB — RENAL FUNCTION PANEL
ALBUMIN: 3.8 g/dL (ref 3.5–5.2)
BUN: 54 mg/dL — ABNORMAL HIGH (ref 6–23)
CHLORIDE: 97 meq/L (ref 96–112)
CO2: 32 mEq/L (ref 19–32)
Calcium: 10.2 mg/dL (ref 8.4–10.5)
Creatinine, Ser: 1.7 mg/dL — ABNORMAL HIGH (ref 0.4–1.2)
GFR: 31.5 mL/min — ABNORMAL LOW (ref >60.00)
Glucose, Bld: 154 mg/dL — ABNORMAL HIGH (ref 70–99)
PHOSPHORUS: 4.2 mg/dL (ref 2.3–4.6)
Potassium: 5 mEq/L (ref 3.5–5.1)
Sodium: 137 mEq/L (ref 135–145)

## 2013-07-27 LAB — TSH: TSH: 2.23 u[IU]/mL (ref 0.35–5.50)

## 2013-07-27 MED ORDER — ESCITALOPRAM OXALATE 10 MG PO TABS: ORAL_TABLET | ORAL | Status: DC

## 2013-07-27 MED ORDER — LORAZEPAM 0.5 MG PO TABS: 0.5000 mg | ORAL_TABLET | Freq: Two times a day (BID) | ORAL | Status: DC | PRN

## 2013-07-27 NOTE — Progress Notes (Signed)
Pre visit review using our clinic review tool, if applicable. No additional management support is needed unless otherwise documented below in the visit note. 

## 2013-07-27 NOTE — Patient Instructions (Signed)
DASH Diet  The DASH diet stands for "Dietary Approaches to Stop Hypertension." It is a healthy eating plan that has been shown to reduce high blood pressure (hypertension) in as little as 14 days, while also possibly providing other significant health benefits. These other health benefits include reducing the risk of breast cancer after menopause and reducing the risk of type 2 diabetes, heart disease, colon cancer, and stroke. Health benefits also include weight loss and slowing kidney failure in patients with chronic kidney disease.   DIET GUIDELINES  · Limit salt (sodium). Your diet should contain less than 1500 mg of sodium daily.  · Limit refined or processed carbohydrates. Your diet should include mostly whole grains. Desserts and added sugars should be used sparingly.  · Include small amounts of heart-healthy fats. These types of fats include nuts, oils, and tub margarine. Limit saturated and trans fats. These fats have been shown to be harmful in the body.  CHOOSING FOODS   The following food groups are based on a 2000 calorie diet. See your Registered Dietitian for individual calorie needs.  Grains and Grain Products (6 to 8 servings daily)  · Eat More Often: Whole-wheat bread, brown rice, whole-grain or wheat pasta, quinoa, popcorn without added fat or salt (air popped).  · Eat Less Often: White bread, white pasta, white rice, cornbread.  Vegetables (4 to 5 servings daily)  · Eat More Often: Fresh, frozen, and canned vegetables. Vegetables may be raw, steamed, roasted, or grilled with a minimal amount of fat.  · Eat Less Often/Avoid: Creamed or fried vegetables. Vegetables in a cheese sauce.  Fruit (4 to 5 servings daily)  · Eat More Often: All fresh, canned (in natural juice), or frozen fruits. Dried fruits without added sugar. One hundred percent fruit juice (½ cup [237 mL] daily).  · Eat Less Often: Dried fruits with added sugar. Canned fruit in light or heavy syrup.  Lean Meats, Fish, and Poultry (2  servings or less daily. One serving is 3 to 4 oz [85-114 g]).  · Eat More Often: Ninety percent or leaner ground beef, tenderloin, sirloin. Round cuts of beef, chicken breast, turkey breast. All fish. Grill, bake, or broil your meat. Nothing should be fried.  · Eat Less Often/Avoid: Fatty cuts of meat, turkey, or chicken leg, thigh, or wing. Fried cuts of meat or fish.  Dairy (2 to 3 servings)  · Eat More Often: Low-fat or fat-free milk, low-fat plain or light yogurt, reduced-fat or part-skim cheese.  · Eat Less Often/Avoid: Milk (whole, 2%). Whole milk yogurt. Full-fat cheeses.  Nuts, Seeds, and Legumes (4 to 5 servings per week)  · Eat More Often: All without added salt.  · Eat Less Often/Avoid: Salted nuts and seeds, canned beans with added salt.  Fats and Sweets (limited)  · Eat More Often: Vegetable oils, tub margarines without trans fats, sugar-free gelatin. Mayonnaise and salad dressings.  · Eat Less Often/Avoid: Coconut oils, palm oils, butter, stick margarine, cream, half and half, cookies, candy, pie.  FOR MORE INFORMATION  The Dash Diet Eating Plan: www.dashdiet.org  Document Released: 04/19/2011 Document Revised: 07/23/2011 Document Reviewed: 04/19/2011  Exit[employer]® Patient Information ©2014 [employer].

## 2013-07-28 ENCOUNTER — Telehealth: Payer: Self-pay | Admitting: Family Medicine

## 2013-07-28 LAB — VITAMIN D 25 HYDROXY (VIT D DEFICIENCY, FRACTURES): VIT D 25 HYDROXY: 39 ng/mL (ref 30–89)

## 2013-07-28 MED ORDER — ROSUVASTATIN CALCIUM 20 MG PO TABS: 20.0000 mg | ORAL_TABLET | Freq: Every day | ORAL | Status: DC

## 2013-07-28 NOTE — Telephone Encounter (Signed)
Relevant patient education assigned to patient using Emmi. ° °

## 2013-07-29 ENCOUNTER — Encounter: Payer: Self-pay | Admitting: Family Medicine

## 2013-07-29 NOTE — Assessment & Plan Note (Signed)
Tolerating Coumadin 

## 2013-07-29 NOTE — Assessment & Plan Note (Signed)
Well controlled, no changes to meds. Encouraged heart healthy diet such as the DASH diet and exercise as tolerated.  °

## 2013-07-29 NOTE — Progress Notes (Signed)
Patient ID: STACEE EARP, female   DOB: [birth year], 73 y.o.   MRN: [id number] FRANCOISE CHOJNOWSKI [id number] Jun 06, 1940 07/29/2013      Progress Note-Follow Up  Subjective  Chief Complaint  Chief Complaint  Patient presents with  . Follow-up    4 week    HPI  Patient is a 73 year old female in today for routine medical care. BP has been improved. Tolerating Enalapril. No further hospitalizations. Denies CP/palp/SOB/HA/congestion/fevers/GI or GU c/o. Taking meds as prescribed. No new concerns  Past Medical History  Diagnosis Date  . LBBB (left bundle branch block)   . Carotid stenosis     a. 10/19/2011 carotid duplex - Mild hard plaque bilaterally. Stable 40-59% bilateral ICA stenosis. Carotid US (04/2013):  Bilateral 40-59% ICA.  F/u 1 year  . Dyslipidemia   . Hypertension   . Chronic combined systolic and diastolic CHF (congestive heart failure)     a. EF as low as 25% in 2006;  b. EF 60-65% in 12/2011;  c. 02/2013 Echo: EF 35-40, mod mid-dist antsept HK, Gr 2 DD, mild LVH.  . Back pain, chronic     "just when I walk; mass on 3rd and 4th vertebrae right lower back"  . Cardiomyopathy, ischemic     a. 2012 s/p SJM 3231-40 Uni BiV ICD, ser # [id number].  Marland Kitchen CAD (coronary artery disease)     a. 05/2001 CABG x4: LIMA->LAD, VG->D1, VG->D2, VG->RCA.  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 CKD (chronic kidney disease), stage III   . Biventricular implantable cardiac defibrillator in situ 2007, 2012    a. 2007;  b. 2012 Gen change: SJM 3231-40 Uni BiV ICD, ser # [id number].  Marland Kitchen Hypoxemia requiring supplemental oxygen   . Candidiasis of vulva and vagina   . Hypothyroidism   . 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 Benay Spice, in remission  . History of pneumonia 12/27/11    "3 times; it's been a long time ago"  . OSA on CPAP   . Gout ~ 08/2011  . PAF (paroxysmal atrial fibrillation)     on Coumadin  . DM (diabetes mellitus), type 2  with complications     insulin dependent, retinopathy, neuropathy  . Mural thrombus of left ventricle     before 2003, while on Coumadin, No h/o CVA  . Morbid obesity with BMI of 45.0-49.9, adult     Ht. 5'4". BMI 47.2  . Vitamin D deficiency 06/09/2012    historical  . Olecranon bursitis of right elbow 12/29/2012  . History of recurrent UTIs   . Diabetes 05/24/2013    Past Surgical History  Procedure Laterality Date  . Cardiac catheterization  06/03/01  . Cholecystectomy  1970  . Tubal ligation  1972  . Abdominal hysterectomy  1982  . Insert / replace / remove pacemaker  2007; 2012    w/AICD  . Coronary artery bypass graft  2003    CABG X4  . Cataract extraction      left eye    Family History  Problem Relation Age of Onset  . Kidney cancer Mother     kidney and female repo - died @ 22  . Heart disease Father     died @ 52  . Asthma Mother   . Stroke Father     History   Social History  . Marital Status: Married    Spouse Name: N/A  Number of Children: Y  . Years of Education: N/A   Occupational History  . 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    Social History Main Topics  . Smoking status: Never Smoker   . Smokeless tobacco: Never Used  . Alcohol Use: No  . Drug Use: No  . Sexual Activity: Not Currently   Other Topics Concern  . Not on file   Social History Narrative   Lives in [place] with her husband.  She does not routinely exercise or adhere to any particular diet.      Current Outpatient Prescriptions on File Prior to Visit  Medication Sig Dispense Refill  . albuterol (PROVENTIL HFA;VENTOLIN HFA) 108 (90 BASE) MCG/ACT inhaler Inhale 2 puffs into the lungs every 6 (six) hours as needed for wheezing or shortness of breath.  1 Inhaler  1  . amLODipine (NORVASC) 5 MG tablet Take 1 tablet (5 mg total) by mouth daily.  30 tablet  3  . calcium-vitamin D (OSCAL WITH D) 500-200 MG-UNIT per tablet Take 1 tablet by mouth daily with breakfast.      .  carvedilol (COREG) 25 MG tablet Take 1 tablet (25 mg total) by mouth 2 (two) times daily with a meal.  60 tablet  3  . enalapril (VASOTEC) 10 MG tablet Take 2 tablets (20 mg total) by mouth daily.  60 tablet  3  . fluticasone (FLONASE) 50 MCG/ACT nasal spray Place 2 sprays into both nostrils daily as needed for allergies or rhinitis. As needed for nasal stuffiness.  16 g  2  . gabapentin (NEURONTIN) 300 MG capsule Take 1 capsule (300 mg total) by mouth 2 (two) times daily.  180 capsule  1  . glucose blood (FREESTYLE LITE) test strip DX: 250.60  Check sugars bid and as needed  100 each  2  . insulin NPH (HUMULIN N,NOVOLIN N) 100 UNIT/ML injection Inject 25-44 Units into the skin 2 (two) times daily before a meal. Take 44 units in the morning and 25 units in the evening      . insulin regular (NOVOLIN R,HUMULIN R) 100 units/mL injection Change to 15 units three times a with meals (to cover all meals--this is the same total amount but spread out)  10 mL  12  . KRILL OIL PO Take 1 capsule by mouth daily. Mega Red      . levothyroxine (SYNTHROID, LEVOTHROID) 25 MCG tablet Take 1 tablet (25 mcg total) by mouth daily before breakfast.  30 tablet  1  . Multiple Vitamins-Minerals (MULTIVITAMIN WITH MINERALS) tablet Take 1 tablet by mouth daily.        Marland Kitchen torsemide (DEMADEX) 20 MG tablet 1 tablet daily. May take an extra tablet for 3pound weight gain, increased swelling, or shortness of breath  60 tablet  2  . traMADol (ULTRAM) 50 MG tablet TAKE ONE TABLET BY MOUTH TWICE DAILY AS NEEDED FOR PAIN  60 tablet  0  . Vitamin D, Ergocalciferol, (DRISDOL) 50000 UNITS CAPS capsule Take 50,000 Units by mouth every 7 (seven) days. On Tuesday      . warfarin (COUMADIN) 5 MG tablet Take 7.5 mg by mouth daily at 6 PM.       No current facility-administered medications on file prior to visit.    Allergies  Allergen Reactions  . Sulfonamide Derivatives Other (See Comments)    Unknown; "childhood allergy/mother"     Review of Systems  Review of Systems  Constitutional: Negative for  fever and malaise/fatigue.  HENT: Negative for congestion.   Eyes: Negative for discharge.  Respiratory: Negative for shortness of breath.   Cardiovascular: Negative for chest pain, palpitations and leg swelling.  Gastrointestinal: Negative for nausea, abdominal pain and diarrhea.  Genitourinary: Negative for dysuria.  Musculoskeletal: Negative for falls.  Skin: Negative for rash.  Neurological: Negative for loss of consciousness and headaches.  Endo/Heme/Allergies: Negative for polydipsia.  Psychiatric/Behavioral: Negative for depression and suicidal ideas. The patient is not nervous/anxious and does not have insomnia.     Objective  BP 128/64  Pulse 61  Temp(Src) 98.1 F (36.7 C) (Oral)  Ht 5\' 4"  (1.626 m)  Wt 255 lb 1.3 oz (115.704 kg)  BMI 43.76 kg/m2  SpO2 %  Physical Exam  Physical Exam  Constitutional: She is oriented to person, place, and time and well-developed, well-nourished, and in no distress. No distress.  HENT:  Head: Normocephalic and atraumatic.  Eyes: Conjunctivae are normal.  Neck: Neck supple. No thyromegaly present.  Cardiovascular: Normal rate, regular rhythm and normal heart sounds.   Pulmonary/Chest: Effort normal and breath sounds normal. She has no wheezes.  Abdominal: She exhibits no distension and no mass.  Musculoskeletal: She exhibits no edema.  Lymphadenopathy:    She has no cervical adenopathy.  Neurological: She is alert and oriented to person, place, and time.  Skin: Skin is warm and dry. No rash noted. She is not diaphoretic.  Psychiatric: Memory, affect and judgment normal.    Lab Results  Component Value Date   TSH 2.23 07/27/2013   Lab Results  Component Value Date   WBC 7.9 07/27/2013   HGB 11.9* 07/27/2013   HCT 35.3* 07/27/2013   MCV 87.1 07/27/2013   PLT 212.0 07/27/2013   Lab Results  Component Value Date   CREATININE 1.7* 07/27/2013   BUN 54*  07/27/2013   NA 137 07/27/2013   K 5.0 07/27/2013   CL 97 07/27/2013   CO2 32 07/27/2013   Lab Results  Component Value Date   ALT 17 07/27/2013   AST 18 07/27/2013   ALKPHOS 60 07/27/2013   BILITOT 0.4 07/27/2013   Lab Results  Component Value Date   CHOL 164 07/27/2013   Lab Results  Component Value Date   HDL 37.60* 07/27/2013   Lab Results  Component Value Date   LDLCALC 41 07/27/2013   Lab Results  Component Value Date   TRIG 428.0* 07/27/2013   Lab Results  Component Value Date   CHOLHDL 4 07/27/2013     Assessment & Plan  HYPERTENSION Well controlled, no changes to meds. Encouraged heart healthy diet such as the DASH diet and exercise as tolerated.   DIABETES MELLITUS, WITH AUTONOMIC NEUROPATHY Recent A1C is 6.2  DYSLIPIDEMIA Tolerating statin, encouraged heart healthy diet, avoid trans fats, minimize simple carbs and saturated fats. Increase exercise as tolerated. Increase Crestor to 20 mg qhs   ATRIAL FIBRILLATION, PAROXYSMAL Tolerating Coumadin

## 2013-07-29 NOTE — Assessment & Plan Note (Signed)
Recent A1C is 6.2

## 2013-07-29 NOTE — Assessment & Plan Note (Signed)
Tolerating statin, encouraged heart healthy diet, avoid trans fats, minimize simple carbs and saturated fats. Increase exercise as tolerated. Increase Crestor to 20 mg qhs

## 2013-07-30 ENCOUNTER — Encounter: Payer: Self-pay | Admitting: *Deleted

## 2013-08-03 ENCOUNTER — Encounter: Payer: Self-pay | Admitting: *Deleted

## 2013-08-06 ENCOUNTER — Other Ambulatory Visit: Payer: Self-pay | Admitting: Family Medicine

## 2013-08-10 ENCOUNTER — Ambulatory Visit (INDEPENDENT_AMBULATORY_CARE_PROVIDER_SITE_OTHER): Payer: Medicare Other

## 2013-08-10 DIAGNOSIS — I6350 Cerebral infarction due to unspecified occlusion or stenosis of unspecified cerebral artery: Secondary | ICD-10-CM | POA: Diagnosis not present

## 2013-08-10 DIAGNOSIS — I238 Other current complications following acute myocardial infarction: Secondary | ICD-10-CM | POA: Diagnosis not present

## 2013-08-10 DIAGNOSIS — I4891 Unspecified atrial fibrillation: Secondary | ICD-10-CM

## 2013-08-10 DIAGNOSIS — Z5181 Encounter for therapeutic drug level monitoring: Secondary | ICD-10-CM | POA: Diagnosis not present

## 2013-08-10 LAB — POCT INR: INR: 2

## 2013-08-12 ENCOUNTER — Other Ambulatory Visit: Payer: Self-pay | Admitting: Family Medicine

## 2013-08-13 NOTE — Telephone Encounter (Signed)
Please advise refill? Last RX was done on 07-13-13, quantity 60 with 0 refills

## 2013-08-19 DIAGNOSIS — E7800 Pure hypercholesterolemia, unspecified: Secondary | ICD-10-CM | POA: Diagnosis not present

## 2013-08-19 DIAGNOSIS — E039 Hypothyroidism, unspecified: Secondary | ICD-10-CM | POA: Diagnosis not present

## 2013-08-19 DIAGNOSIS — I10 Essential (primary) hypertension: Secondary | ICD-10-CM | POA: Diagnosis not present

## 2013-08-19 DIAGNOSIS — IMO0001 Reserved for inherently not codable concepts without codable children: Secondary | ICD-10-CM | POA: Diagnosis not present

## 2013-08-24 ENCOUNTER — Telehealth: Payer: Self-pay

## 2013-08-24 DIAGNOSIS — I10 Essential (primary) hypertension: Secondary | ICD-10-CM

## 2013-08-24 DIAGNOSIS — C8589 Other specified types of non-Hodgkin lymphoma, extranodal and solid organ sites: Secondary | ICD-10-CM

## 2013-08-24 NOTE — Telephone Encounter (Signed)
Lab order changed to [facility]

## 2013-08-24 NOTE — Telephone Encounter (Signed)
Message copied by Varney Daily on Mon Aug 24, 2013 11:10 AM ------      Message from: Irven Baltimore      Created: Mon Aug 24, 2013  9:05 AM       Patient states that she will be going to [facility] lab tomorrow morning and wanted to make sure that the orders were in the system. Thanks! ------

## 2013-08-25 ENCOUNTER — Other Ambulatory Visit: Payer: Self-pay | Admitting: Family Medicine

## 2013-08-25 ENCOUNTER — Ambulatory Visit (INDEPENDENT_AMBULATORY_CARE_PROVIDER_SITE_OTHER): Payer: Medicare Other

## 2013-08-25 DIAGNOSIS — C8589 Other specified types of non-Hodgkin lymphoma, extranodal and solid organ sites: Secondary | ICD-10-CM | POA: Diagnosis not present

## 2013-08-25 DIAGNOSIS — I10 Essential (primary) hypertension: Secondary | ICD-10-CM | POA: Diagnosis not present

## 2013-08-25 DIAGNOSIS — N289 Disorder of kidney and ureter, unspecified: Secondary | ICD-10-CM

## 2013-08-25 LAB — RENAL FUNCTION PANEL
Albumin: 3.7 g/dL (ref 3.5–5.2)
BUN: 46 mg/dL — AB (ref 6–23)
CO2: 31 mEq/L (ref 19–32)
CREATININE: 1.8 mg/dL — AB (ref 0.4–1.2)
Calcium: 9.4 mg/dL (ref 8.4–10.5)
Chloride: 100 mEq/L (ref 96–112)
GFR: 30.06 mL/min — ABNORMAL LOW (ref >60.00)
Glucose, Bld: 95 mg/dL (ref 70–99)
PHOSPHORUS: 4.4 mg/dL (ref 2.3–4.6)
Potassium: 5.2 mEq/L — ABNORMAL HIGH (ref 3.5–5.1)
Sodium: 137 mEq/L (ref 135–145)

## 2013-08-25 LAB — CBC
HCT: 34.5 % — ABNORMAL LOW (ref 36.0–46.0)
Hemoglobin: 11.5 g/dL — ABNORMAL LOW (ref 12.0–15.0)
MCHC: 33.4 g/dL (ref 30.0–36.0)
MCV: 88.5 fl (ref 78.0–100.0)
Platelets: 198 10*3/uL (ref 150.0–400.0)
RBC: 3.9 Mil/uL (ref 3.87–5.11)
RDW: 14.7 % — AB (ref 11.5–14.6)
WBC: 8.4 10*3/uL (ref 4.5–10.5)

## 2013-08-25 MED ORDER — ENALAPRIL MALEATE 2.5 MG PO TABS: 2.5000 mg | ORAL_TABLET | Freq: Every day | ORAL | Status: DC

## 2013-08-27 ENCOUNTER — Encounter: Payer: Self-pay | Admitting: Family Medicine

## 2013-08-31 ENCOUNTER — Ambulatory Visit (INDEPENDENT_AMBULATORY_CARE_PROVIDER_SITE_OTHER): Payer: Medicare Other | Admitting: *Deleted

## 2013-08-31 DIAGNOSIS — I238 Other current complications following acute myocardial infarction: Secondary | ICD-10-CM | POA: Diagnosis not present

## 2013-08-31 DIAGNOSIS — I6350 Cerebral infarction due to unspecified occlusion or stenosis of unspecified cerebral artery: Secondary | ICD-10-CM | POA: Diagnosis not present

## 2013-08-31 DIAGNOSIS — I4891 Unspecified atrial fibrillation: Secondary | ICD-10-CM | POA: Diagnosis not present

## 2013-08-31 DIAGNOSIS — Z5181 Encounter for therapeutic drug level monitoring: Secondary | ICD-10-CM | POA: Diagnosis not present

## 2013-08-31 LAB — POCT INR: INR: 2

## 2013-09-07 ENCOUNTER — Ambulatory Visit (HOSPITAL_COMMUNITY)
Admission: RE | Admit: 2013-09-07 | Discharge: 2013-09-07 | Disposition: A | Discharge status: Home / Self Care | Payer: Medicare Other | Source: Ambulatory Visit | Attending: Family Medicine | Admitting: Family Medicine

## 2013-09-07 ENCOUNTER — Ambulatory Visit (HOSPITAL_BASED_OUTPATIENT_CLINIC_OR_DEPARTMENT_OTHER)
Admission: RE | Admit: 2013-09-07 | Discharge: 2013-09-07 | Disposition: A | Discharge status: Home / Self Care | Payer: Medicare Other | Source: Ambulatory Visit | Attending: Family Medicine | Admitting: Family Medicine

## 2013-09-07 ENCOUNTER — Encounter (HOSPITAL_COMMUNITY): Payer: Self-pay | Admitting: *Deleted

## 2013-09-07 VITALS — BP 118/58 | HR 62 | Wt 250.8 lb

## 2013-09-07 DIAGNOSIS — I447 Left bundle-branch block, unspecified: Secondary | ICD-10-CM | POA: Insufficient documentation

## 2013-09-07 DIAGNOSIS — I129 Hypertensive chronic kidney disease with stage 1 through stage 4 chronic kidney disease, or unspecified chronic kidney disease: Secondary | ICD-10-CM | POA: Insufficient documentation

## 2013-09-07 DIAGNOSIS — N183 Chronic kidney disease, stage 3 unspecified: Secondary | ICD-10-CM | POA: Insufficient documentation

## 2013-09-07 DIAGNOSIS — G4733 Obstructive sleep apnea (adult) (pediatric): Secondary | ICD-10-CM | POA: Diagnosis not present

## 2013-09-07 DIAGNOSIS — I509 Heart failure, unspecified: Secondary | ICD-10-CM | POA: Insufficient documentation

## 2013-09-07 DIAGNOSIS — I2510 Atherosclerotic heart disease of native coronary artery without angina pectoris: Secondary | ICD-10-CM | POA: Insufficient documentation

## 2013-09-07 DIAGNOSIS — I5042 Chronic combined systolic (congestive) and diastolic (congestive) heart failure: Secondary | ICD-10-CM

## 2013-09-07 DIAGNOSIS — E119 Type 2 diabetes mellitus without complications: Secondary | ICD-10-CM | POA: Diagnosis not present

## 2013-09-07 DIAGNOSIS — E785 Hyperlipidemia, unspecified: Secondary | ICD-10-CM | POA: Diagnosis not present

## 2013-09-07 DIAGNOSIS — I4891 Unspecified atrial fibrillation: Secondary | ICD-10-CM | POA: Diagnosis not present

## 2013-09-07 DIAGNOSIS — I2589 Other forms of chronic ischemic heart disease: Secondary | ICD-10-CM | POA: Insufficient documentation

## 2013-09-07 DIAGNOSIS — I5022 Chronic systolic (congestive) heart failure: Secondary | ICD-10-CM | POA: Insufficient documentation

## 2013-09-07 DIAGNOSIS — I517 Cardiomegaly: Secondary | ICD-10-CM | POA: Diagnosis not present

## 2013-09-07 DIAGNOSIS — Z95810 Presence of automatic (implantable) cardiac defibrillator: Secondary | ICD-10-CM | POA: Diagnosis not present

## 2013-09-07 DIAGNOSIS — Z951 Presence of aortocoronary bypass graft: Secondary | ICD-10-CM | POA: Diagnosis not present

## 2013-09-07 DIAGNOSIS — Z8673 Personal history of transient ischemic attack (TIA), and cerebral infarction without residual deficits: Secondary | ICD-10-CM | POA: Diagnosis not present

## 2013-09-07 LAB — BASIC METABOLIC PANEL
BUN: 54 mg/dL — ABNORMAL HIGH (ref 6–23)
CHLORIDE: 96 meq/L (ref 96–112)
CO2: 29 mEq/L (ref 19–32)
Calcium: 11.3 mg/dL — ABNORMAL HIGH (ref 8.4–10.5)
Creatinine, Ser: 1.61 mg/dL — ABNORMAL HIGH (ref 0.50–1.10)
GFR calc Af Amer: 36 mL/min — ABNORMAL LOW (ref >90)
GFR calc non Af Amer: 31 mL/min — ABNORMAL LOW (ref >90)
Glucose, Bld: 115 mg/dL — ABNORMAL HIGH (ref 70–99)
Potassium: 4.9 mEq/L (ref 3.7–5.3)
Sodium: 140 mEq/L (ref 137–147)

## 2013-09-07 LAB — PRO B NATRIURETIC PEPTIDE: PRO B NATRI PEPTIDE: 983 pg/mL — AB (ref 0–125)

## 2013-09-07 MED ORDER — ENALAPRIL MALEATE 2.5 MG PO TABS: 2.5000 mg | ORAL_TABLET | Freq: Two times a day (BID) | ORAL | Status: DC

## 2013-09-07 NOTE — Progress Notes (Signed)
  Echocardiogram 2D Echocardiogram has been performed.  Donata Clay 09/07/2013, 10:11 AM

## 2013-09-07 NOTE — Progress Notes (Signed)
[name], female   DOB: 1941/03/03, 73 y.o.   MRN: [id number]  PCP: Dr Charlett Blake  HPI: Destiny [name] is a 73 y.o. female with a history of CAD, status post CABG in 2003, ischemic cardiomyopathy with variable ejection fractions noted in the past (echo in 12/2011 demonstrated normal LV function with an EF 60-65%), status post CRT-D, chronic combined systolic and diastolic CHF, paroxysmal atrial fibrillation, prior LV mural thrombus, on chronic Coumadin therapy, CKD, T2DM. She has a prior admission to the hospital for acute renal failure in the setting of rhabdomyolysis (while taking colchicine and fenofibrate). Carotid US (12/14): Bilateral ICA stenosis 40-59%.   She was admitted 10/23-11/11/14 with acute on chronic combined systolic and diastolic CHF. ECHO with worsening LV function with an EF of 35-40%. Hospital stay was complicated by a/c renal failure and respiratory failure. Had short term intubation. She was made a DNR/DNI.  Discharge weight 256 lbs. She is on home oxygen 2L by nasal cannula.   ECHO (03/06/13): EF 35-40% ECHO (09/07/13): EF 40%, grade II diastolic dysfunction, mildly decreased RV systolic function with normal RV size.   Follow up: She is stable symptomatically.  Walking is limited by back and hip pain (bursitis), and she uses a scooter when she has to go long distances.  She can walk in her house without dyspnea.  No orthopnea or PND.  No chest pain. Recently, enalapril was cut back from 20 mg daily to 2.5 mg daily with elevated creatinine and potassium. Leg edema is improved.  She uses a cane when she walks in her house. She is in NSR today by exam.   Labs  05/01/13 K 5.4 Creatinine 1.46 Pro BNP 646  05/19/13 K 5.0 Creatinine 1.38 3/15 LDL 41, HDL 38, creatinine 1.7 4/15 K 5.2, creatinine 1.8  ROS: All systems negative except as listed in HPI, PMH and Problem List.  Past Medical History  Diagnosis Date  . LBBB (left bundle branch block)   . Carotid stenosis      a. 10/19/2011 carotid duplex - Mild hard plaque bilaterally. Stable 40-59% bilateral ICA stenosis. Carotid US (04/2013):  Bilateral 40-59% ICA.  F/u 1 year  . Dyslipidemia   . Hypertension   . Chronic combined systolic and diastolic CHF (congestive heart failure)     a. EF as low as 25% in 2006;  b. EF 60-65% in 12/2011;  c. 02/2013 Echo: EF 35-40, mod mid-dist antsept HK, Gr 2 DD, mild LVH.  . Back pain, chronic     "just when I walk; mass on 3rd and 4th vertebrae right lower back"  . Cardiomyopathy, ischemic     a. 2012 s/p SJM 3231-40 Uni BiV ICD, ser # [id number].  Marland Kitchen CAD (coronary artery disease)     a. 05/2001 CABG x4: LIMA->LAD, VG->D1, VG->D2, VG->RCA.  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 CKD (chronic kidney disease), stage III   . Biventricular implantable cardiac defibrillator in situ 2007, 2012    a. 2007;  b. 2012 Gen change: SJM 3231-40 Uni BiV ICD, ser # [id number].  Marland Kitchen Hypoxemia requiring supplemental oxygen   . Candidiasis of vulva and vagina   . Hypothyroidism   . 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 Benay Spice, in remission  . History of pneumonia 12/27/11    "3 times; it's been a long time ago"  . OSA on CPAP   . Gout ~  08/2011  . PAF (paroxysmal atrial fibrillation)     on Coumadin  . DM (diabetes mellitus), type 2 with complications     insulin dependent, retinopathy, neuropathy  . Mural thrombus of left ventricle     before 2003, while on Coumadin, No h/o CVA  . Morbid obesity with BMI of 45.0-49.9, adult     Ht. 5'4". BMI 47.2  . Vitamin D deficiency 06/09/2012    historical  . Olecranon bursitis of right elbow 12/29/2012  . History of recurrent UTIs   . Diabetes 05/24/2013    Current Outpatient Prescriptions  Medication Sig Dispense Refill  . albuterol (PROVENTIL HFA;VENTOLIN HFA) 108 (90 BASE) MCG/ACT inhaler Inhale 2 puffs into the lungs every 6 (six) hours as needed for wheezing or shortness  of breath.  1 Inhaler  1  . amLODipine (NORVASC) 5 MG tablet Take 1 tablet (5 mg total) by mouth daily.  30 tablet  3  . calcium-vitamin D (OSCAL WITH D) 500-200 MG-UNIT per tablet Take 1 tablet by mouth daily with breakfast.      . carvedilol (COREG) 25 MG tablet Take 1 tablet (25 mg total) by mouth 2 (two) times daily with a meal.  60 tablet  3  . enalapril (VASOTEC) 2.5 MG tablet Take 1 tablet (2.5 mg total) by mouth 2 (two) times daily.  60 tablet  2  . escitalopram (LEXAPRO) 10 MG tablet 1/2 tab po daily x 7 days then increase to 1 tab po daily  30 tablet  2  . fluticasone (FLONASE) 50 MCG/ACT nasal spray Place 2 sprays into both nostrils daily as needed for allergies or rhinitis. As needed for nasal stuffiness.  16 g  2  . gabapentin (NEURONTIN) 300 MG capsule TAKE ONE CAPSULE BY MOUTH TWICE DAILY  60 capsule  0  . glucose blood (FREESTYLE LITE) test strip DX: 250.60  Check sugars bid and as needed  100 each  2  . insulin NPH (HUMULIN N,NOVOLIN N) 100 UNIT/ML injection Inject 25-44 Units into the skin 2 (two) times daily before a meal. Take 44 units in the morning and 25 units in the evening      . insulin regular (NOVOLIN R,HUMULIN R) 100 units/mL injection Change to 15 units three times a with meals (to cover all meals--this is the same total amount but spread out)  10 mL  12  . KRILL OIL PO Take 1 capsule by mouth daily. Mega Red      . levothyroxine (SYNTHROID, LEVOTHROID) 25 MCG tablet TAKE ONE TABLET BY MOUTH ONCE DAILY BEFORE BREAKFAST  30 tablet  1  . LORazepam (ATIVAN) 0.5 MG tablet Take 1 tablet (0.5 mg total) by mouth 2 (two) times daily as needed for anxiety or sleep.  45 tablet  1  . Multiple Vitamins-Minerals (MULTIVITAMIN WITH MINERALS) tablet Take 1 tablet by mouth daily.        . rosuvastatin (CRESTOR) 20 MG tablet Take 1 tablet (20 mg total) by mouth daily.  30 tablet  3  . torsemide (DEMADEX) 20 MG tablet TAKE ONE TABLET BY MOUTH ONCE DAILY. MAY TAKE AN EXTRA TABLET FOR 3  POUND WEIGHT GAIN, INCREASED SWELLING, OR SHORTNESS OF BREATH  120 tablet  0  . traMADol (ULTRAM) 50 MG tablet TAKE ONE TABLET BY MOUTH TWICE DAILY AS NEEDED FOR PAIN  60 tablet  0  . Vitamin D, Ergocalciferol, (DRISDOL) 50000 UNITS CAPS capsule Take 50,000 Units by mouth every 7 (seven) days. On Tuesday      .  warfarin (COUMADIN) 5 MG tablet Take 7.5 mg by mouth daily at 6 PM.       No current facility-administered medications for this encounter.    Filed Vitals:   09/07/13 1022  BP: 118/58  Pulse: 62  Weight: 250 lb 12 oz (113.739 kg)  SpO2: 91%    PHYSICAL EXAM: General:  Chronically ill appearing. No resp difficulty; on 2 L continuous. Husband present  HEENT: normal Neck: supple. JVP difficult to assess d/t thick neck but does not appear elevated. Carotids 2+ bilaterally; no bruits. No lymphadenopathy or thryomegaly appreciated. Cor: PMI normal. Regular rate & rhythm. No rubs, gallops or murmurs. Lungs: clear Abdomen: Obese, soft, nontender, obese, nondistended. No hepatosplenomegaly. No bruits or masses. Good bowel sounds. Extremities: no cyanosis, clubbing, rash, no edema Neuro: alert & orientedx3, cranial nerves grossly intact. Moves all 4 extremities w/o difficulty. Affect pleasant.  ASSESSMENT & PLAN:  1) Chronic systolic HF: Ischemic cardiomyopathy s/p CRT-D (St. Jude).  I reviewed today's echo; EF is about 40%.  NYHA II-III symptoms but seems more limited by hip and low back. Creatinine has risen recently.  - Will continue torsemide 20 mg daily with an extra 10 mg as needed for 3 pound weight gain.  - Continue current Coreg.  - Increase enalapril to 2.5 mg bid.  Will get BMET today and in 2 wks.  - No spironolactone for now given mildly elevated K on recent BMET.   - Reinforced the need and importance of daily weights, a low sodium diet, and fluid restriction (less than 2 L a day). Instructed to call the HF clinic if weight increases more than 3 lbs overnight or 5 lbs in a  week. 2) PAF: Appears to be in NSR today, regular. Continue BB and Coumadin. No bleeding problems 3) OSA: Continue nightly CPAP  4) CKD:  As above, repeat creatinine today and recheck in 2 weeks on slightly higher enalapril. Followup with nephrology.  5) CAD: s/p CABG.  No chest pain.  Continue statin.  She is not on ASA given comadin use and stable CAD.   Followup in 2 months.   Larey Dresser 09/07/2013 2:39 PM

## 2013-09-07 NOTE — Patient Instructions (Signed)
Increase Enalapril to 2.5 mg Twice daily   Labs today and in 2 weeks  Your physician recommends that you schedule a follow-up appointment in: 2 months  You have been referred to Dr Marval Regal at [facility], they will contact you for appointment

## 2013-09-08 ENCOUNTER — Telehealth: Payer: Self-pay

## 2013-09-08 DIAGNOSIS — E875 Hyperkalemia: Secondary | ICD-10-CM

## 2013-09-08 DIAGNOSIS — E559 Vitamin D deficiency, unspecified: Secondary | ICD-10-CM

## 2013-09-08 DIAGNOSIS — C8589 Other specified types of non-Hodgkin lymphoma, extranodal and solid organ sites: Secondary | ICD-10-CM

## 2013-09-08 DIAGNOSIS — E785 Hyperlipidemia, unspecified: Secondary | ICD-10-CM

## 2013-09-08 DIAGNOSIS — I10 Essential (primary) hypertension: Secondary | ICD-10-CM

## 2013-09-08 NOTE — Telephone Encounter (Signed)
Opened in error

## 2013-09-08 NOTE — Telephone Encounter (Signed)
Lab orders placed per md

## 2013-09-09 ENCOUNTER — Telehealth: Payer: Self-pay | Admitting: Family Medicine

## 2013-09-09 NOTE — Telephone Encounter (Signed)
Pt is requesting what is going on with her blood work, she is giving results but not told what to do. Meds to take? Please advise.

## 2013-09-10 NOTE — Telephone Encounter (Signed)
Pt informed

## 2013-09-10 NOTE — Telephone Encounter (Signed)
So I can never see back into my chart messages but what I need her to do is hold the calcium and come in for repeat labs with the PTH, vitamin D level, renal and ionized calcium in next week or 2. I was confident I put that in a note to the patient but if she did not get that apologize for me and make sure she understands

## 2013-09-10 NOTE — Telephone Encounter (Signed)
Patient states she is taking calcium should she stop this?

## 2013-09-15 ENCOUNTER — Other Ambulatory Visit: Payer: Self-pay | Admitting: Family Medicine

## 2013-09-16 ENCOUNTER — Other Ambulatory Visit (INDEPENDENT_AMBULATORY_CARE_PROVIDER_SITE_OTHER): Payer: Medicare Other

## 2013-09-16 DIAGNOSIS — E785 Hyperlipidemia, unspecified: Secondary | ICD-10-CM | POA: Diagnosis not present

## 2013-09-16 DIAGNOSIS — I10 Essential (primary) hypertension: Secondary | ICD-10-CM | POA: Diagnosis not present

## 2013-09-16 DIAGNOSIS — E875 Hyperkalemia: Secondary | ICD-10-CM

## 2013-09-16 DIAGNOSIS — C8589 Other specified types of non-Hodgkin lymphoma, extranodal and solid organ sites: Secondary | ICD-10-CM | POA: Diagnosis not present

## 2013-09-16 DIAGNOSIS — E559 Vitamin D deficiency, unspecified: Secondary | ICD-10-CM | POA: Diagnosis not present

## 2013-09-16 LAB — RENAL FUNCTION PANEL
Albumin: 3.7 g/dL (ref 3.5–5.2)
BUN: 54 mg/dL — AB (ref 6–23)
CO2: 29 mEq/L (ref 19–32)
CREATININE: 1.6 mg/dL — AB (ref 0.4–1.2)
Calcium: 8.9 mg/dL (ref 8.4–10.5)
Chloride: 103 mEq/L (ref 96–112)
GFR: 33.77 mL/min — ABNORMAL LOW (ref >60.00)
Glucose, Bld: 138 mg/dL — ABNORMAL HIGH (ref 70–99)
PHOSPHORUS: 3.3 mg/dL (ref 2.3–4.6)
POTASSIUM: 4.6 meq/L (ref 3.5–5.1)
Sodium: 139 mEq/L (ref 135–145)

## 2013-09-17 LAB — PTH, INTACT AND CALCIUM
Calcium: 8.7 mg/dL (ref 8.4–10.5)
PTH: 143.6 pg/mL — ABNORMAL HIGH (ref 14.0–72.0)

## 2013-09-17 LAB — CALCIUM, IONIZED: Calcium, Ion: 1.19 mmol/L (ref 1.12–1.32)

## 2013-09-17 LAB — VITAMIN D 25 HYDROXY (VIT D DEFICIENCY, FRACTURES): Vit D, 25-Hydroxy: 34 ng/mL (ref 30–89)

## 2013-09-21 ENCOUNTER — Telehealth: Payer: Self-pay | Admitting: Family Medicine

## 2013-09-21 ENCOUNTER — Ambulatory Visit (INDEPENDENT_AMBULATORY_CARE_PROVIDER_SITE_OTHER): Payer: Medicare Other | Admitting: Family Medicine

## 2013-09-21 ENCOUNTER — Encounter: Payer: Self-pay | Admitting: Family Medicine

## 2013-09-21 VITALS — BP 130/78 | HR 59 | Temp 98.7°F | Ht 64.0 in | Wt 255.0 lb

## 2013-09-21 DIAGNOSIS — E039 Hypothyroidism, unspecified: Secondary | ICD-10-CM

## 2013-09-21 DIAGNOSIS — I2589 Other forms of chronic ischemic heart disease: Secondary | ICD-10-CM

## 2013-09-21 DIAGNOSIS — N183 Chronic kidney disease, stage 3 unspecified: Secondary | ICD-10-CM

## 2013-09-21 DIAGNOSIS — I10 Essential (primary) hypertension: Secondary | ICD-10-CM

## 2013-09-21 DIAGNOSIS — R609 Edema, unspecified: Secondary | ICD-10-CM | POA: Diagnosis not present

## 2013-09-21 DIAGNOSIS — E119 Type 2 diabetes mellitus without complications: Secondary | ICD-10-CM

## 2013-09-21 DIAGNOSIS — E559 Vitamin D deficiency, unspecified: Secondary | ICD-10-CM

## 2013-09-21 DIAGNOSIS — E785 Hyperlipidemia, unspecified: Secondary | ICD-10-CM

## 2013-09-21 DIAGNOSIS — E1139 Type 2 diabetes mellitus with other diabetic ophthalmic complication: Secondary | ICD-10-CM

## 2013-09-21 MED ORDER — SIMVASTATIN 20 MG PO TABS: 20.0000 mg | ORAL_TABLET | Freq: Every day | ORAL | Status: DC

## 2013-09-21 MED ORDER — TORSEMIDE 20 MG PO TABS: ORAL_TABLET | ORAL | Status: DC

## 2013-09-21 NOTE — Telephone Encounter (Signed)
Lab order week of 11-30-2013 elam Return in about 10 weeks (around 11/30/2013) for lipid, renal, cbc, tsh, hepatic prior. Diagnostic follow-up: Instructions: Check out comments: Add A PTH for hypercalcemia

## 2013-09-21 NOTE — Progress Notes (Signed)
Pre visit review using our clinic review tool, if applicable. No additional management support is needed unless otherwise documented below in the visit note. 

## 2013-09-21 NOTE — Patient Instructions (Addendum)
Add  A PTH for hypercalcemia  Parathyroid Hormone This is a test to determine whether PTH levels are responding normally to changes in blood calcium levels. It also helps to distinguish the cause of calcium imbalances, and to evaluate parathyroid function. When calcium blood levels are higher or lower than normal, and when your caregiver may want to determine how well your parathyroid glands are working. Parathyroid hormone (PTH) helps the body maintain stable levels of calcium in the blood. It is part of a 'feedback loop' that includes calcium, PTH, vitamin D, and, to some extent, phosphate and magnesium. Conditions and diseases that disrupt this feedback loop can cause inappropriate elevations or decreases in calcium and PTH levels and lead to symptoms of hypercalcemia (raised blood levels of calcium) or hypocalcemia (low blood levels of calcium).  PTH is produced by four parathyroid glands that are located in the neck beside the thyroid gland. Normally, these glands secrete PTH into the bloodstream in response to low blood calcium levels. Parathyroid hormone then works in three ways to help raise blood calcium levels back to normal. It takes calcium from the body's bone, stimulates the activation of vitamin D in the kidney (which in turn increases the absorption of calcium from the intestines), and suppresses the excretion of calcium in the urine (while encouraging excretion of phosphate). As calcium levels begin to increase in the blood, PTH normally decreases. PREPARATION FOR TEST You should have nothing to eat or drink except for water after midnight on the day of the test or as directed by your caregiver. A blood sample is obtained by inserting a needle into a vein in the arm. NORMAL FINDINGS Conventional Normal  PTH intact (whole)  Assay includes intact PTH  Values (pg/mL)10-65  SI Units (ng/L)10-65  PTH N-terminalN-terminal  Values (pg/mL) 8-24  SI Units (ng/L)8-24  PTH  C-terminal  Assay Includes C-terminal  Values (pg/mL) 50-330  SI Units (ng/L) 50-330  Intact PTH  Midmolecule Ranges for normal findings may vary among different laboratories and hospitals. You should always check with your doctor after having lab work or other tests done to discuss the meaning of your test results and whether your values are considered within normal limits. MEANING OF TEST  Your caregiver will go over the test results with you and discuss the importance and meaning of your results, as well as treatment options and the need for additional tests if necessary. OBTAINING THE TEST RESULTS  It is your responsibility to obtain your test results. Ask the lab or department performing the test when and how you will get your results. Document Released: 06/02/2004 Document Revised: 07/23/2011 Document Reviewed: 04/11/2008 [facility] Patient Information 2014 [place], [place].

## 2013-09-26 ENCOUNTER — Encounter: Payer: Self-pay | Admitting: Family Medicine

## 2013-09-26 DIAGNOSIS — R609 Edema, unspecified: Secondary | ICD-10-CM | POA: Insufficient documentation

## 2013-09-26 HISTORY — DX: Hypercalcemia: E83.52

## 2013-09-26 NOTE — Assessment & Plan Note (Signed)
Following with endocrinology well controlled per patient.

## 2013-09-26 NOTE — Assessment & Plan Note (Signed)
On Levothyroxine, continue to monitor 

## 2013-09-26 NOTE — Assessment & Plan Note (Signed)
normal

## 2013-09-26 NOTE — Assessment & Plan Note (Signed)
Well controlled, no changes to meds. Encouraged heart healthy diet such as the DASH diet and exercise as tolerated.  °

## 2013-09-26 NOTE — Assessment & Plan Note (Signed)
Improved on recheck. PTH elevated

## 2013-09-26 NOTE — Progress Notes (Signed)
[name], female   DOB: 02-12-1941, 73 y.o.   MRN: [id number] Destiny [name] [id number] [birth year] 09/26/2013      Progress Note-Follow Up  Subjective  Chief Complaint  Chief Complaint  Patient presents with  . Follow-up    8 week    HPI  Patient is a 73 year old female in today for routine medical care. Is feeling well. Has established with nephrology. No recent illness. Well controlled, no changes to meds. Encouraged heart healthy diet such as the DASH diet and exercise as tolerated. Sugars well controlled per patient  Past Medical History  Diagnosis Date  . LBBB (left bundle branch block)   . Carotid stenosis     a. 10/19/2011 carotid duplex - Mild hard plaque bilaterally. Stable 40-59% bilateral ICA stenosis. Carotid US (04/2013):  Bilateral 40-59% ICA.  F/u 1 year  . Dyslipidemia   . Hypertension   . Chronic combined systolic and diastolic CHF (congestive heart failure)     a. EF as low as 25% in 2006;  b. EF 60-65% in 12/2011;  c. 02/2013 Echo: EF 35-40, mod mid-dist antsept HK, Gr 2 DD, mild LVH.  . Back pain, chronic     "just when I walk; mass on 3rd and 4th vertebrae right lower back"  . Cardiomyopathy, ischemic     a. 2012 s/p SJM 3231-40 Uni BiV ICD, ser # [id number].  Marland Kitchen CAD (coronary artery disease)     a. 05/2001 CABG x4: LIMA->LAD, VG->D1, VG->D2, VG->RCA.  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 CKD (chronic kidney disease), stage III   . Biventricular implantable cardiac defibrillator in situ 2007, 2012    a. 2007;  b. 2012 Gen change: SJM 3231-40 Uni BiV ICD, ser # [id number].  Marland Kitchen Hypoxemia requiring supplemental oxygen   . Candidiasis of vulva and vagina   . Hypothyroidism   . 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 Benay Spice, in remission  . History of pneumonia 12/27/11    "3 times; it's been a long time ago"  . OSA on CPAP   . Gout ~ 08/2011  . PAF (paroxysmal atrial fibrillation)      on Coumadin  . DM (diabetes mellitus), type 2 with complications     insulin dependent, retinopathy, neuropathy  . Mural thrombus of left ventricle     before 2003, while on Coumadin, No h/o CVA  . Morbid obesity with BMI of 45.0-49.9, adult     Ht. 5'4". BMI 47.2  . Vitamin D deficiency 06/09/2012    historical  . Olecranon bursitis of right elbow 12/29/2012  . History of recurrent UTIs   . Diabetes 05/24/2013  . Hypercalcemia 09/26/2013    Past Surgical History  Procedure Laterality Date  . Cardiac catheterization  06/03/01  . Cholecystectomy  1970  . Tubal ligation  1972  . Abdominal hysterectomy  1982  . Insert / replace / remove pacemaker  2007; 2012    w/AICD  . Coronary artery bypass graft  2003    CABG X4  . Cataract extraction      left eye    Family History  Problem Relation Age of Onset  . Kidney cancer Mother     kidney and female repo - died @ 51  . Heart disease Father     died @ 69  . Asthma Mother   . Stroke Father  History   Social History  . Marital Status: Married    Spouse Name: N/A    Number of Children: Y  . Years of Education: N/A   Occupational History  . 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    Social History Main Topics  . Smoking status: Never Smoker   . Smokeless tobacco: Never Used  . Alcohol Use: No  . Drug Use: No  . Sexual Activity: Not Currently   Other Topics Concern  . Not on file   Social History Narrative   Lives in [place] with her husband.  She does not routinely exercise or adhere to any particular diet.      Current Outpatient Prescriptions on File Prior to Visit  Medication Sig Dispense Refill  . albuterol (PROVENTIL HFA;VENTOLIN HFA) 108 (90 BASE) MCG/ACT inhaler Inhale 2 puffs into the lungs every 6 (six) hours as needed for wheezing or shortness of breath.  1 Inhaler  1  . amLODipine (NORVASC) 5 MG tablet Take 1 tablet (5 mg total) by mouth daily.  30 tablet  3  . carvedilol (COREG) 25 MG tablet  Take 1 tablet (25 mg total) by mouth 2 (two) times daily with a meal.  60 tablet  3  . enalapril (VASOTEC) 2.5 MG tablet Take 1 tablet (2.5 mg total) by mouth 2 (two) times daily.  60 tablet  2  . escitalopram (LEXAPRO) 10 MG tablet 1/2 tab po daily x 7 days then increase to 1 tab po daily  30 tablet  2  . fluticasone (FLONASE) 50 MCG/ACT nasal spray Place 2 sprays into both nostrils daily as needed for allergies or rhinitis. As needed for nasal stuffiness.  16 g  2  . gabapentin (NEURONTIN) 300 MG capsule TAKE ONE CAPSULE BY MOUTH TWICE DAILY  60 capsule  0  . glucose blood (FREESTYLE LITE) test strip DX: 250.60  Check sugars bid and as needed  100 each  2  . insulin NPH (HUMULIN N,NOVOLIN N) 100 UNIT/ML injection Inject 25-44 Units into the skin 2 (two) times daily before a meal. Take 44 units in the morning and 25 units in the evening      . insulin regular (NOVOLIN R,HUMULIN R) 100 units/mL injection Change to 15 units three times a with meals (to cover all meals--this is the same total amount but spread out)  10 mL  12  . levothyroxine (SYNTHROID, LEVOTHROID) 25 MCG tablet TAKE ONE TABLET BY MOUTH ONCE DAILY BEFORE BREAKFAST  30 tablet  1  . LORazepam (ATIVAN) 0.5 MG tablet Take 1 tablet (0.5 mg total) by mouth 2 (two) times daily as needed for anxiety or sleep.  45 tablet  1  . traMADol (ULTRAM) 50 MG tablet TAKE ONE TABLET BY MOUTH TWICE DAILY AS NEEDED FOR PAIN.  60 tablet  0  . warfarin (COUMADIN) 5 MG tablet Take 7.5 mg by mouth daily at 6 PM.       No current facility-administered medications on file prior to visit.    Allergies  Allergen Reactions  . Sulfonamide Derivatives Other (See Comments)    Unknown; "childhood allergy/mother"    Review of Systems  Review of Systems  Constitutional: Negative for fever and malaise/fatigue.  HENT: Negative for congestion.   Eyes: Negative for discharge.  Respiratory: Negative for shortness of breath.   Cardiovascular: Negative for chest  pain, palpitations and leg swelling.  Gastrointestinal: Negative for nausea, abdominal pain and diarrhea.  Genitourinary: Negative  for dysuria.  Musculoskeletal: Negative for falls.  Skin: Negative for rash.  Neurological: Negative for loss of consciousness and headaches.  Endo/Heme/Allergies: Negative for polydipsia.  Psychiatric/Behavioral: Negative for depression and suicidal ideas. The patient is not nervous/anxious and does not have insomnia.     Objective  BP 130/78  Pulse 59  Temp(Src) 98.7 F (37.1 C) (Oral)  Ht 5\' 4"  (1.626 m)  Wt 255 lb (115.667 kg)  BMI 43.75 kg/m2  SpO2 92%  Physical Exam  Physical Exam  Constitutional: She is oriented to person, place, and time and well-developed, well-nourished, and in no distress. No distress.  HENT:  Head: Normocephalic and atraumatic.  Eyes: Conjunctivae are normal.  Neck: Neck supple. No thyromegaly present.  Cardiovascular: Normal rate, regular rhythm and normal heart sounds.   Pulmonary/Chest: Effort normal and breath sounds normal. She has no wheezes.  Abdominal: She exhibits no distension and no mass.  Musculoskeletal: She exhibits no edema.  Lymphadenopathy:    She has no cervical adenopathy.  Neurological: She is alert and oriented to person, place, and time.  Skin: Skin is warm and dry. No rash noted. She is not diaphoretic.  Psychiatric: Memory, affect and judgment normal.    Lab Results  Component Value Date   TSH 2.23 07/27/2013   Lab Results  Component Value Date   WBC 8.4 08/25/2013   HGB 11.5* 08/25/2013   HCT 34.5* 08/25/2013   MCV 88.5 08/25/2013   PLT 198.0 08/25/2013   Lab Results  Component Value Date   CREATININE 1.6* 09/16/2013   BUN 54* 09/16/2013   NA 139 09/16/2013   K 4.6 09/16/2013   CL 103 09/16/2013   CO2 29 09/16/2013   Lab Results  Component Value Date   ALT 17 07/27/2013   AST 18 07/27/2013   ALKPHOS 60 07/27/2013   BILITOT 0.4 07/27/2013   Lab Results  Component Value Date   CHOL 164  07/27/2013   Lab Results  Component Value Date   HDL 37.60* 07/27/2013   Lab Results  Component Value Date   LDLCALC 41 07/27/2013   Lab Results  Component Value Date   TRIG 428.0* 07/27/2013   Lab Results  Component Value Date   CHOLHDL 4 07/27/2013     Assessment & Plan  Hypercalcemia Improved on recheck. PTH elevated  HYPERTENSION Well controlled, no changes to meds. Encouraged heart healthy diet such as the DASH diet and exercise as tolerated.   Diabetes Following with endocrinology well controlled per patient.  Hypothyroidism On Levothyroxine, continue to monitor  Vitamin D deficiency normal

## 2013-09-28 ENCOUNTER — Other Ambulatory Visit: Payer: Self-pay | Admitting: Family Medicine

## 2013-09-29 ENCOUNTER — Ambulatory Visit (INDEPENDENT_AMBULATORY_CARE_PROVIDER_SITE_OTHER): Payer: Medicare Other

## 2013-09-29 ENCOUNTER — Encounter: Payer: Self-pay | Admitting: Internal Medicine

## 2013-09-29 ENCOUNTER — Ambulatory Visit (INDEPENDENT_AMBULATORY_CARE_PROVIDER_SITE_OTHER): Payer: Medicare Other | Admitting: Internal Medicine

## 2013-09-29 VITALS — BP 121/61 | HR 62 | Ht 64.0 in | Wt 252.8 lb

## 2013-09-29 DIAGNOSIS — I4891 Unspecified atrial fibrillation: Secondary | ICD-10-CM | POA: Diagnosis not present

## 2013-09-29 DIAGNOSIS — I2589 Other forms of chronic ischemic heart disease: Secondary | ICD-10-CM | POA: Diagnosis not present

## 2013-09-29 DIAGNOSIS — Z95810 Presence of automatic (implantable) cardiac defibrillator: Secondary | ICD-10-CM | POA: Diagnosis not present

## 2013-09-29 DIAGNOSIS — I238 Other current complications following acute myocardial infarction: Secondary | ICD-10-CM | POA: Diagnosis not present

## 2013-09-29 DIAGNOSIS — I6350 Cerebral infarction due to unspecified occlusion or stenosis of unspecified cerebral artery: Secondary | ICD-10-CM | POA: Diagnosis not present

## 2013-09-29 DIAGNOSIS — I5043 Acute on chronic combined systolic (congestive) and diastolic (congestive) heart failure: Secondary | ICD-10-CM | POA: Diagnosis not present

## 2013-09-29 DIAGNOSIS — I255 Ischemic cardiomyopathy: Secondary | ICD-10-CM

## 2013-09-29 DIAGNOSIS — Z5181 Encounter for therapeutic drug level monitoring: Secondary | ICD-10-CM

## 2013-09-29 LAB — MDC_IDC_ENUM_SESS_TYPE_INCLINIC
Battery Remaining Longevity: 39.6 mo
HighPow Impedance: 47.947
Lead Channel Impedance Value: 287.5 Ohm
Lead Channel Impedance Value: 312.5 Ohm
Lead Channel Impedance Value: 412.5 Ohm
Lead Channel Pacing Threshold Amplitude: 0.75 V
Lead Channel Pacing Threshold Pulse Width: 0.5 ms
Lead Channel Pacing Threshold Pulse Width: 0.6 ms
Lead Channel Sensing Intrinsic Amplitude: 2.1 mV
Lead Channel Setting Pacing Amplitude: 1.75 V
Lead Channel Setting Pacing Amplitude: 2.375
Lead Channel Setting Pacing Amplitude: 2.5 V
Lead Channel Setting Pacing Pulse Width: 0.5 ms
Lead Channel Setting Pacing Pulse Width: 0.6 ms
MDC IDC MSMT LEADCHNL LV PACING THRESHOLD AMPLITUDE: 1.375 V
MDC IDC MSMT LEADCHNL RV PACING THRESHOLD AMPLITUDE: 0.75 V
MDC IDC MSMT LEADCHNL RV PACING THRESHOLD PULSEWIDTH: 0.5 ms
MDC IDC MSMT LEADCHNL RV SENSING INTR AMPL: 11.7 mV
MDC IDC PG SERIAL: 631685
MDC IDC SESS DTM: 20150519102243
MDC IDC SET LEADCHNL RV SENSING SENSITIVITY: 0.5 mV
MDC IDC SET ZONE DETECTION INTERVAL: 250 ms
MDC IDC STAT BRADY RA PERCENT PACED: 60 %
MDC IDC STAT BRADY RV PERCENT PACED: 99 %
Zone Setting Detection Interval: 310 ms
Zone Setting Detection Interval: 345 ms

## 2013-09-29 LAB — POCT INR: INR: 2.8

## 2013-09-29 NOTE — Assessment & Plan Note (Signed)
Her St. Jude ICD is working normally. Will recheck in several months. 

## 2013-09-29 NOTE — Progress Notes (Signed)
HPI Destiny [name] returns today for followup. She is a very pleasant 73 year old woman with an ischemic cardiomyopathy, chronic systolic heart failure, left bundle branch block, status post biventricular ICD implantation. In the interim she has been stable. She was in the hospital for over a month secondary to CHF, renal insuff., and pneumonia. She is chronically oxygen dependent and desaturates at rest without oxygen. She does have morbid obesity and hypertension. She notes mild peripheral edema. She is been able to lose little weight. No syncope. Allergies  Allergen Reactions  . Sulfonamide Derivatives Other (See Comments)    Unknown; "childhood allergy/mother"     Current Outpatient Prescriptions  Medication Sig Dispense Refill  . albuterol (PROVENTIL HFA;VENTOLIN HFA) 108 (90 BASE) MCG/ACT inhaler Inhale 2 puffs into the lungs every 6 (six) hours as needed for wheezing or shortness of breath.  1 Inhaler  1  . amLODipine (NORVASC) 5 MG tablet TAKE ONE TABLET BY MOUTH DAILY  30 tablet  0  . carvedilol (COREG) 25 MG tablet Take 1 tablet (25 mg total) by mouth 2 (two) times daily with a meal.  60 tablet  3  . enalapril (VASOTEC) 2.5 MG tablet Take 1 tablet (2.5 mg total) by mouth 2 (two) times daily.  60 tablet  2  . escitalopram (LEXAPRO) 10 MG tablet 1/2 tab po daily x 7 days then increase to 1 tab po daily  30 tablet  2  . fluticasone (FLONASE) 50 MCG/ACT nasal spray Place 2 sprays into both nostrils daily as needed for allergies or rhinitis. As needed for nasal stuffiness.  16 g  2  . gabapentin (NEURONTIN) 300 MG capsule TAKE ONE CAPSULE BY MOUTH TWICE DAILY  60 capsule  0  . glucose blood (FREESTYLE LITE) test strip DX: 250.60  Check sugars bid and as needed  100 each  2  . insulin NPH (HUMULIN N,NOVOLIN N) 100 UNIT/ML injection Inject 25-44 Units into the skin 2 (two) times daily before a meal. Take 44 units in the morning and 25 units in the evening      . insulin regular (NOVOLIN  R,HUMULIN R) 100 units/mL injection Change to 15 units three times a with meals (to cover all meals--this is the same total amount but spread out)  10 mL  12  . levothyroxine (SYNTHROID, LEVOTHROID) 25 MCG tablet TAKE ONE TABLET BY MOUTH ONCE DAILY BEFORE BREAKFAST  30 tablet  1  . LORazepam (ATIVAN) 0.5 MG tablet Take 1 tablet (0.5 mg total) by mouth 2 (two) times daily as needed for anxiety or sleep.  45 tablet  1  . NON FORMULARY Oxygen---24 hour      . simvastatin (ZOCOR) 20 MG tablet Take 1 tablet (20 mg total) by mouth at bedtime.  30 tablet  2  . torsemide (DEMADEX) 20 MG tablet Take 1 tab po daily and may take an extra tab for 3# weight gain, swelling or SOB  135 tablet  3  . traMADol (ULTRAM) 50 MG tablet TAKE ONE TABLET BY MOUTH TWICE DAILY AS NEEDED FOR PAIN.  60 tablet  0  . warfarin (COUMADIN) 5 MG tablet Take 7.5 mg by mouth daily at 6 PM.       No current facility-administered medications for this visit.     Past Medical History  Diagnosis Date  . LBBB (left bundle branch block)   . Carotid stenosis     a. 10/19/2011 carotid duplex - Mild hard plaque bilaterally. Stable 40-59% bilateral ICA stenosis. Carotid  US (04/2013):  Bilateral 40-59% ICA.  F/u 1 year  . Dyslipidemia   . Hypertension   . Chronic combined systolic and diastolic CHF (congestive heart failure)     a. EF as low as 25% in 2006;  b. EF 60-65% in 12/2011;  c. 02/2013 Echo: EF 35-40, mod mid-dist antsept HK, Gr 2 DD, mild LVH.  . Back pain, chronic     "just when I walk; mass on 3rd and 4th vertebrae right lower back"  . Cardiomyopathy, ischemic     a. 2012 s/p SJM 3231-40 Uni BiV ICD, ser # [id number].  Marland Kitchen CAD (coronary artery disease)     a. 05/2001 CABG x4: LIMA->LAD, VG->D1, VG->D2, VG->RCA.  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 CKD (chronic kidney disease), stage III   . Biventricular implantable cardiac defibrillator in situ 2007, 2012    a. 2007;  b. 2012 Gen change: SJM  3231-40 Uni BiV ICD, ser # [id number].  Marland Kitchen Hypoxemia requiring supplemental oxygen   . Candidiasis of vulva and vagina   . Hypothyroidism   . 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 Benay Spice, in remission  . History of pneumonia 12/27/11    "3 times; it's been a long time ago"  . OSA on CPAP   . Gout ~ 08/2011  . PAF (paroxysmal atrial fibrillation)     on Coumadin  . DM (diabetes mellitus), type 2 with complications     insulin dependent, retinopathy, neuropathy  . Mural thrombus of left ventricle     before 2003, while on Coumadin, No h/o CVA  . Morbid obesity with BMI of 45.0-49.9, adult     Ht. 5'4". BMI 47.2  . Vitamin D deficiency 06/09/2012    historical  . Olecranon bursitis of right elbow 12/29/2012  . History of recurrent UTIs   . Diabetes 05/24/2013  . Hypercalcemia 09/26/2013    ROS:   All systems reviewed and negative except as noted in the HPI.   Past Surgical History  Procedure Laterality Date  . Cardiac catheterization  06/03/01  . Cholecystectomy  1970  . Tubal ligation  1972  . Abdominal hysterectomy  1982  . Insert / replace / remove pacemaker  2007; 2012    w/AICD  . Coronary artery bypass graft  2003    CABG X4  . Cataract extraction      left eye     Family History  Problem Relation Age of Onset  . Kidney cancer Mother     kidney and female repo - died @ 60  . Heart disease Father     died @ 17  . Asthma Mother   . Stroke Father      History   Social History  . Marital Status: Married    Spouse Name: N/A    Number of Children: Y  . Years of Education: N/A   Occupational History  . 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    Social History Main Topics  . Smoking status: Never Smoker   . Smokeless tobacco: Never Used  . Alcohol Use: No  . Drug Use: No  . Sexual Activity: Not Currently   Other Topics Concern  . Not on file   Social History Narrative   Lives in [place] with her husband.  She  does not routinely exercise or adhere to any particular diet.  BP 121/61  Pulse 62  Ht 5\' 4"  (1.626 m)  Wt 252 lb 12.8 oz (114.669 kg)  BMI 43.37 kg/m2  Physical Exam:  stable appearing morbidly obese, 73 year old woman, NAD HEENT: Unremarkable Neck:  6 cm JVD, no thyromegally Back:  No CVA tenderness Lungs:  Clear except for basilar rales. No wheezes, no rhonchi. Well-healed ICD incision. HEART:  Regular rate rhythm, no murmurs, no rubs, no clicks Abd:  soft, positive bowel sounds, obese, no organomegally, no rebound, no guarding Ext:  2 plus pulses, no edema, no cyanosis, no clubbing Skin:  No rashes no nodules Neuro:  CN II through XII intact, motor grossly intact  DEVICE  Normal device function.  See PaceArt for details.   Assess/Plan:

## 2013-09-29 NOTE — Assessment & Plan Note (Signed)
Her symptoms appear to be stable. I have asked her to continue her current meds and maintain a low sodium diet.

## 2013-09-29 NOTE — Assessment & Plan Note (Signed)
As she is wheelchair bound, she is quite sedentary. She denies anginal symptoms.

## 2013-09-29 NOTE — Patient Instructions (Signed)
Your physician wants you to follow-up in: 12 months with Dr Taylor You will receive a reminder letter in the mail two months in advance. If you don't receive a letter, please call our office to schedule the follow-up appointment.   Remote monitoring is used to monitor your Pacemaker of ICD from home. This monitoring reduces the number of office visits required to check your device to one time per year. It allows us to keep an eye on the functioning of your device to ensure it is working properly. You are scheduled for a device check from home on 12/31/13. You may send your transmission at any time that day. If you have a wireless device, the transmission will be sent automatically. After your physician reviews your transmission, you will receive a postcard with your next transmission date.   

## 2013-09-30 ENCOUNTER — Encounter: Payer: Self-pay | Admitting: Internal Medicine

## 2013-10-10 ENCOUNTER — Other Ambulatory Visit: Payer: Self-pay | Admitting: Internal Medicine

## 2013-10-14 DIAGNOSIS — D638 Anemia in other chronic diseases classified elsewhere: Secondary | ICD-10-CM | POA: Diagnosis not present

## 2013-10-14 DIAGNOSIS — I129 Hypertensive chronic kidney disease with stage 1 through stage 4 chronic kidney disease, or unspecified chronic kidney disease: Secondary | ICD-10-CM | POA: Diagnosis not present

## 2013-10-14 DIAGNOSIS — E1129 Type 2 diabetes mellitus with other diabetic kidney complication: Secondary | ICD-10-CM | POA: Diagnosis not present

## 2013-10-14 DIAGNOSIS — N2581 Secondary hyperparathyroidism of renal origin: Secondary | ICD-10-CM | POA: Diagnosis not present

## 2013-10-14 DIAGNOSIS — N183 Chronic kidney disease, stage 3 unspecified: Secondary | ICD-10-CM | POA: Diagnosis not present

## 2013-10-26 ENCOUNTER — Other Ambulatory Visit: Payer: Self-pay | Admitting: Family Medicine

## 2013-10-27 ENCOUNTER — Ambulatory Visit (INDEPENDENT_AMBULATORY_CARE_PROVIDER_SITE_OTHER): Payer: Medicare Other

## 2013-10-27 DIAGNOSIS — I4891 Unspecified atrial fibrillation: Secondary | ICD-10-CM | POA: Diagnosis not present

## 2013-10-27 DIAGNOSIS — I238 Other current complications following acute myocardial infarction: Secondary | ICD-10-CM

## 2013-10-27 DIAGNOSIS — Z5181 Encounter for therapeutic drug level monitoring: Secondary | ICD-10-CM | POA: Diagnosis not present

## 2013-10-27 DIAGNOSIS — I6350 Cerebral infarction due to unspecified occlusion or stenosis of unspecified cerebral artery: Secondary | ICD-10-CM | POA: Diagnosis not present

## 2013-10-27 LAB — POCT INR: INR: 3.1

## 2013-11-02 ENCOUNTER — Other Ambulatory Visit: Payer: Self-pay | Admitting: Family Medicine

## 2013-11-04 ENCOUNTER — Ambulatory Visit (HOSPITAL_COMMUNITY)
Admission: RE | Admit: 2013-11-04 | Discharge: 2013-11-04 | Disposition: A | Discharge status: Home / Self Care | Payer: Medicare Other | Source: Ambulatory Visit | Attending: Pediatrics | Admitting: Pediatrics

## 2013-11-04 ENCOUNTER — Encounter (HOSPITAL_COMMUNITY): Payer: Self-pay

## 2013-11-04 VITALS — BP 130/72 | HR 64 | Wt 253.4 lb

## 2013-11-04 DIAGNOSIS — I5022 Chronic systolic (congestive) heart failure: Secondary | ICD-10-CM | POA: Insufficient documentation

## 2013-11-04 DIAGNOSIS — I5042 Chronic combined systolic (congestive) and diastolic (congestive) heart failure: Secondary | ICD-10-CM | POA: Diagnosis not present

## 2013-11-04 DIAGNOSIS — E785 Hyperlipidemia, unspecified: Secondary | ICD-10-CM | POA: Diagnosis not present

## 2013-11-04 DIAGNOSIS — I428 Other cardiomyopathies: Secondary | ICD-10-CM | POA: Diagnosis not present

## 2013-11-04 DIAGNOSIS — Z7901 Long term (current) use of anticoagulants: Secondary | ICD-10-CM | POA: Insufficient documentation

## 2013-11-04 DIAGNOSIS — I4891 Unspecified atrial fibrillation: Secondary | ICD-10-CM | POA: Diagnosis not present

## 2013-11-04 DIAGNOSIS — Z9981 Dependence on supplemental oxygen: Secondary | ICD-10-CM | POA: Diagnosis not present

## 2013-11-04 DIAGNOSIS — I10 Essential (primary) hypertension: Secondary | ICD-10-CM | POA: Diagnosis not present

## 2013-11-04 DIAGNOSIS — I480 Paroxysmal atrial fibrillation: Secondary | ICD-10-CM

## 2013-11-04 DIAGNOSIS — Z794 Long term (current) use of insulin: Secondary | ICD-10-CM | POA: Diagnosis not present

## 2013-11-04 DIAGNOSIS — E119 Type 2 diabetes mellitus without complications: Secondary | ICD-10-CM | POA: Insufficient documentation

## 2013-11-04 DIAGNOSIS — I2510 Atherosclerotic heart disease of native coronary artery without angina pectoris: Secondary | ICD-10-CM | POA: Insufficient documentation

## 2013-11-04 DIAGNOSIS — G4733 Obstructive sleep apnea (adult) (pediatric): Secondary | ICD-10-CM | POA: Insufficient documentation

## 2013-11-04 DIAGNOSIS — E039 Hypothyroidism, unspecified: Secondary | ICD-10-CM | POA: Insufficient documentation

## 2013-11-04 DIAGNOSIS — Z6841 Body Mass Index (BMI) 40.0 and over, adult: Secondary | ICD-10-CM | POA: Diagnosis not present

## 2013-11-04 NOTE — Telephone Encounter (Signed)
Refill request for Lorazepam Last filled by MD on- 07/27/2013 #[id number] x1 Last Appt: 09/29/2013 Next Appt: 12/07/2013 Please advise refill?

## 2013-11-04 NOTE — Patient Instructions (Signed)
Follow up in 3 months   Do the following things EVERYDAY: 1) Weigh yourself in the morning before breakfast. Write it down and keep it in a log. 2) Take your medicines as prescribed 3) Eat low salt foods-Limit salt (sodium) to 2000 mg per day.  4) Stay as active as you can everyday 5) Limit all fluids for the day to less than 2 liters  

## 2013-11-04 NOTE — Telephone Encounter (Signed)
Rx request faxed to pharmacy[facility]  

## 2013-11-04 NOTE — Progress Notes (Signed)
[name], female   DOB: December 31, 1940, 73 y.o.   MRN: [id number]   PCP: Dr Charlett Blake Nephrologist: Dr Marval Regal EP: Dr taylor  Pulmonary: Dr Gwenette Greet  HPI: Destiny [name] is a 73 y.o. female with a history of CAD, status post CABG in 2003, ischemic cardiomyopathy with variable ejection fractions noted in the past (echo in 12/2011 demonstrated normal LV function with an EF 60-65%), status post CRT-D, chronic combined systolic and diastolic CHF, paroxysmal atrial fibrillation, prior LV mural thrombus, on chronic Coumadin therapy, CKD, T2DM. She has a prior admission to the hospital for acute renal failure in the setting of rhabdomyolysis (while taking colchicine and fenofibrate). Carotid US (12/14): Bilateral ICA stenosis 40-59%.   She was admitted 10/23-11/11/14 with acute on chronic combined systolic and diastolic CHF. ECHO with worsening LV function with an EF of 35-40%. Hospital stay was complicated by a/c renal failure and respiratory failure. Had short term intubation. She was made a DNR/DNI.  Discharge weight 256 lbs. She is on home oxygen 2L by nasal cannula.   She returns for follow up. Last visit enalapril was increased to 2.5 mg twice a day . Volume status stable. SOB with exertion on 2 liters oxygen and this is her baseline.  Uses an electric scooter outside the house. Uses walker in the house. Ongoing back pain. Weight at home 250-253 pounds. Taking all medications. Limiting fluid intake to < 2 liters per day. Following low salt diet.   ECHO (03/06/13): EF 35-40% ECHO (09/07/13): EF 40%, grade II diastolic dysfunction, mildly decreased RV systolic function with normal RV size.    Labs  05/01/13 K 5.4 Creatinine 1.46 Pro BNP 646  05/19/13 K 5.0 Creatinine 1.38 3/15 LDL 41, HDL 38, creatinine 1.7 4/15 K 5.2, creatinine 1.8 09/16/13 K 4.6 Creatinine 1. 6  ROS: All systems negative except as listed in HPI, PMH and Problem List.  Past Medical History  Diagnosis Date  . LBBB  (left bundle branch block)   . Carotid stenosis     a. 10/19/2011 carotid duplex - Mild hard plaque bilaterally. Stable 40-59% bilateral ICA stenosis. Carotid US (04/2013):  Bilateral 40-59% ICA.  F/u 1 year  . Dyslipidemia   . Hypertension   . Chronic combined systolic and diastolic CHF (congestive heart failure)     a. EF as low as 25% in 2006;  b. EF 60-65% in 12/2011;  c. 02/2013 Echo: EF 35-40, mod mid-dist antsept HK, Gr 2 DD, mild LVH.  . Back pain, chronic     "just when I walk; mass on 3rd and 4th vertebrae right lower back"  . Cardiomyopathy, ischemic     a. 2012 s/p SJM 3231-40 Uni BiV ICD, ser # [id number].  Marland Kitchen CAD (coronary artery disease)     a. 05/2001 CABG x4: LIMA->LAD, VG->D1, VG->D2, VG->RCA.  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 CKD (chronic kidney disease), stage III   . Biventricular implantable cardiac defibrillator in situ 2007, 2012    a. 2007;  b. 2012 Gen change: SJM 3231-40 Uni BiV ICD, ser # [id number].  Marland Kitchen Hypoxemia requiring supplemental oxygen   . Candidiasis of vulva and vagina   . Hypothyroidism   . 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 Benay Spice, in remission  . History of pneumonia 12/27/11    "3 times; it's been a long time ago"  . OSA on CPAP   .  Gout ~ 08/2011  . PAF (paroxysmal atrial fibrillation)     on Coumadin  . DM (diabetes mellitus), type 2 with complications     insulin dependent, retinopathy, neuropathy  . Mural thrombus of left ventricle     before 2003, while on Coumadin, No h/o CVA  . Morbid obesity with BMI of 45.0-49.9, adult     Ht. 5'4". BMI 47.2  . Vitamin D deficiency 06/09/2012    historical  . Olecranon bursitis of right elbow 12/29/2012  . History of recurrent UTIs   . Diabetes 05/24/2013  . Hypercalcemia 09/26/2013    Current Outpatient Prescriptions  Medication Sig Dispense Refill  . albuterol (PROVENTIL HFA;VENTOLIN HFA) 108 (90 BASE) MCG/ACT inhaler Inhale 2  puffs into the lungs every 6 (six) hours as needed for wheezing or shortness of breath.  1 Inhaler  1  . amLODipine (NORVASC) 5 MG tablet TAKE ONE TABLET BY MOUTH DAILY  30 tablet  0  . carvedilol (COREG) 25 MG tablet Take 1 tablet (25 mg total) by mouth 2 (two) times daily with a meal.  60 tablet  3  . enalapril (VASOTEC) 2.5 MG tablet Take 1 tablet (2.5 mg total) by mouth 2 (two) times daily.  60 tablet  2  . escitalopram (LEXAPRO) 10 MG tablet TAKE ONE TABLET BY MOUTH ONCE DAILY.  30 tablet  0  . fluticasone (FLONASE) 50 MCG/ACT nasal spray Place 2 sprays into both nostrils daily as needed for allergies or rhinitis. As needed for nasal stuffiness.  16 g  2  . gabapentin (NEURONTIN) 300 MG capsule TAKE ONE CAPSULE BY MOUTH TWICE DAILY  60 capsule  0  . glucose blood (FREESTYLE LITE) test strip DX: 250.60  Check sugars bid and as needed  100 each  2  . insulin NPH (HUMULIN N,NOVOLIN N) 100 UNIT/ML injection Inject 25-44 Units into the skin 2 (two) times daily before a meal. Take 44 units in the morning and 25 units in the evening      . levothyroxine (SYNTHROID, LEVOTHROID) 25 MCG tablet TAKE ONE TABLET BY MOUTH ONCE DAILY BEFORE BREAKFAST  30 tablet  1  . LORazepam (ATIVAN) 0.5 MG tablet Take 1 tablet (0.5 mg total) by mouth 2 (two) times daily as needed for anxiety or sleep.  45 tablet  1  . NON FORMULARY Oxygen---24 hour      . simvastatin (ZOCOR) 20 MG tablet Take 1 tablet (20 mg total) by mouth at bedtime.  30 tablet  2  . torsemide (DEMADEX) 20 MG tablet Take 1 tab po daily and may take an extra tab for 3# weight gain, swelling or SOB  135 tablet  3  . traMADol (ULTRAM) 50 MG tablet TAKE ONE TABLET BY MOUTH TWICE DAILY AS NEEDED FOR PAIN.  60 tablet  0  . Vitamin D, Ergocalciferol, (DRISDOL) 50000 UNITS CAPS capsule Take 50,000 Units by mouth every 7 (seven) days.      Marland Kitchen warfarin (COUMADIN) 5 MG tablet 1.5 tablets daily except 2 tablets on Wednesdays or as directed by coumadin clinic  150  tablet  1  . insulin regular (NOVOLIN R,HUMULIN R) 100 units/mL injection Change to 15 units three times a with meals (to cover all meals--this is the same total amount but spread out)  10 mL  12   No current facility-administered medications for this encounter.    Filed Vitals:   11/04/13 1024  BP: 130/72  Pulse: 64  Weight: 253 lb 6.4 oz (114.941  kg)  SpO2: 95%    PHYSICAL EXAM: General:  Chronically ill appearing. No resp difficulty; on 2 L continuous. Husband present  HEENT: normal Neck: supple. JVP difficult to assess d/t thick neck but does not appear elevated. Carotids 2+ bilaterally; no bruits. No lymphadenopathy or thryomegaly appreciated. Cor: PMI normal. Regular rate & rhythm. No rubs, gallops or murmurs. Lungs: clear Abdomen: Obese, soft, nontender, obese, nondistended. No hepatosplenomegaly. No bruits or masses. Good bowel sounds. Extremities: no cyanosis, clubbing, rash, no edema Neuro: alert & orientedx3, cranial nerves grossly intact. Moves all 4 extremities w/o difficulty. Affect pleasant.  ASSESSMENT & PLAN:  1) Chronic systolic HF: Ischemic cardiomyopathy s/p CRT-D ([employer]). 08/2013  EF is about 40%.  NYHA II-III symptoms but seems more limited by hip and low back. . Volume status stable. - Will continue torsemide 20 mg daily with an extra 10 mg as needed for 3 pound weight gain.  - Continue current Coreg.  - Continue enalapril to 2.5 mg bid.  No uptitration. Has had low BP at home and dizziness.  - No spironolactone for now given mildly elevated K on recent BMET.   - Reinforced the need and importance of daily weights, a low sodium diet, and fluid restriction (less than 2 L a day). Instructed to call the HF clinic if weight increases more than 3 lbs overnight or 5 lbs in a week. 2) PAF: Regular rate.  Continue BB and Coumadin. No bleeding problems 3) OSA: Continue nightly CPAP  4) CAD: s/p CABG.  No chest pain.  Continue statin.  She is not on ASA given comadin  use and stable CAD.   Follow up in 3 months.   CLEGG,AMY NP-C  11/04/2013 10:36 AM

## 2013-11-05 ENCOUNTER — Encounter (INDEPENDENT_AMBULATORY_CARE_PROVIDER_SITE_OTHER): Payer: Self-pay

## 2013-11-05 ENCOUNTER — Encounter: Payer: Self-pay | Admitting: Pulmonary Disease

## 2013-11-05 ENCOUNTER — Ambulatory Visit (INDEPENDENT_AMBULATORY_CARE_PROVIDER_SITE_OTHER): Payer: Medicare Other | Admitting: Pulmonary Disease

## 2013-11-05 VITALS — BP 122/64 | HR 57 | Temp 97.0°F | Ht 64.5 in | Wt 255.2 lb

## 2013-11-05 DIAGNOSIS — G4733 Obstructive sleep apnea (adult) (pediatric): Secondary | ICD-10-CM | POA: Diagnosis not present

## 2013-11-05 DIAGNOSIS — I2589 Other forms of chronic ischemic heart disease: Secondary | ICD-10-CM | POA: Diagnosis not present

## 2013-11-05 NOTE — Assessment & Plan Note (Signed)
The patient has been doing well with CPAP, and has lost significant weight since last visit. I have commended her on this, and asked her to continue doing so. She is to remain on CPAP, and to keep up with her mask changes and supplies. I will see her back in one year for evaluation of her sleep apnea. Will also arrange for a pulmonary consult at the request of her cardiologist and primary physician per the patient.

## 2013-11-05 NOTE — Patient Instructions (Signed)
Continue on cpap, and keep up with mask changes and supplies. Work on [employer] loss Will arrange a followup visit for a pulmonary consult (33min), with breathing studies the same day.

## 2013-11-05 NOTE — Progress Notes (Signed)
   Subjective:    [name], female    DOB: [birth year], 73 y.o.   MRN: [id number]  HPI Patient comes in today for followup of her obstructive sleep apnea. She tells me that she is wearing CPAP compliantly, and is having no issues with her mask fit or pressure. She feels that she sleeps well during the night, and has adequate daytime alertness. She is asking about pulmonary consultation for her shortness of breath and oxygen need, and tells me that her primary physician and cardiologist have requested this. She will need to be set up for a 30 minute slot for pulmonary evaluation.   Review of Systems  Constitutional: Negative for fever and unexpected weight change.  HENT: Negative for congestion, dental problem, ear pain, nosebleeds, postnasal drip, rhinorrhea, sinus pressure, sneezing, sore throat and trouble swallowing.   Eyes: Negative for redness and itching.  Respiratory: Positive for shortness of breath. Negative for cough, chest tightness and wheezing.   Cardiovascular: Negative for palpitations and leg swelling.  Gastrointestinal: Negative for nausea and vomiting.  Genitourinary: Negative for dysuria.  Musculoskeletal: Negative for joint swelling.  Skin: Negative for rash.  Neurological: Negative for headaches.  Hematological: Does not bruise/bleed easily.  Psychiatric/Behavioral: Negative for dysphoric mood. The patient is not nervous/anxious.        Objective:   Physical Exam Morbidly obese female in no acute distress Nose without purulence or discharge noted No skin breakdown or pressure necrosis from the CPAP mask Neck without lymphadenopathy or thyromegaly Lower extremities with edema noted, no cyanosis Alert and oriented, does not appear to be sleepy, moves all 4 extremities.       Assessment & Plan:

## 2013-11-06 ENCOUNTER — Other Ambulatory Visit: Payer: Self-pay

## 2013-11-06 DIAGNOSIS — E785 Hyperlipidemia, unspecified: Secondary | ICD-10-CM

## 2013-11-06 MED ORDER — FLUTICASONE PROPIONATE 50 MCG/ACT NA SUSP: 2.0000 | Freq: Every day | NASAL | Status: DC | PRN

## 2013-11-06 MED ORDER — SIMVASTATIN 20 MG PO TABS: 20.0000 mg | ORAL_TABLET | Freq: Every day | ORAL | Status: DC

## 2013-11-06 MED ORDER — WARFARIN SODIUM 5 MG PO TABS: ORAL_TABLET | ORAL | Status: DC

## 2013-11-06 MED ORDER — LEVOTHYROXINE SODIUM 25 MCG PO TABS: 25.0000 ug | ORAL_TABLET | Freq: Every day | ORAL | Status: DC

## 2013-11-06 MED ORDER — AMLODIPINE BESYLATE 5 MG PO TABS: 5.0000 mg | ORAL_TABLET | Freq: Every day | ORAL | Status: DC

## 2013-11-06 MED ORDER — ESCITALOPRAM OXALATE 10 MG PO TABS: 10.0000 mg | ORAL_TABLET | Freq: Every day | ORAL | Status: DC

## 2013-11-06 MED ORDER — GABAPENTIN 300 MG PO CAPS: 300.0000 mg | ORAL_CAPSULE | Freq: Two times a day (BID) | ORAL | Status: DC

## 2013-11-09 ENCOUNTER — Other Ambulatory Visit (HOSPITAL_COMMUNITY): Payer: Self-pay | Admitting: *Deleted

## 2013-11-09 DIAGNOSIS — I5042 Chronic combined systolic (congestive) and diastolic (congestive) heart failure: Secondary | ICD-10-CM

## 2013-11-09 MED ORDER — CARVEDILOL 25 MG PO TABS: 25.0000 mg | ORAL_TABLET | Freq: Two times a day (BID) | ORAL | Status: DC

## 2013-11-09 MED ORDER — ENALAPRIL MALEATE 2.5 MG PO TABS: 2.5000 mg | ORAL_TABLET | Freq: Two times a day (BID) | ORAL | Status: DC

## 2013-11-16 ENCOUNTER — Other Ambulatory Visit: Payer: Self-pay

## 2013-11-16 NOTE — Telephone Encounter (Signed)
Can have a refill on the Tramadol with same sig, same number 1 rf but need to know why she needs the Ativan so soon, it should not be due til the 24th of July.

## 2013-11-16 NOTE — Telephone Encounter (Signed)
Please advise refills? Lorazepam was last wrote on 11-04-13 quantity 45 with 0 refills Tramadol was last wrote on 09-25-13 quantity 60 with 0 refills

## 2013-11-17 MED ORDER — TRAMADOL HCL 50 MG PO TABS: 50.0000 mg | ORAL_TABLET | Freq: Two times a day (BID) | ORAL | Status: DC | PRN

## 2013-11-17 NOTE — Telephone Encounter (Signed)
Pt states she will get the ativan at her appt. Pt states she is not out was just going to get a new order through mail  Tramadol RX printed for md to sign and print

## 2013-11-24 ENCOUNTER — Ambulatory Visit (INDEPENDENT_AMBULATORY_CARE_PROVIDER_SITE_OTHER): Payer: Medicare Other

## 2013-11-24 DIAGNOSIS — I6350 Cerebral infarction due to unspecified occlusion or stenosis of unspecified cerebral artery: Secondary | ICD-10-CM

## 2013-11-24 DIAGNOSIS — I4891 Unspecified atrial fibrillation: Secondary | ICD-10-CM

## 2013-11-24 DIAGNOSIS — Z5181 Encounter for therapeutic drug level monitoring: Secondary | ICD-10-CM | POA: Diagnosis not present

## 2013-11-24 DIAGNOSIS — I238 Other current complications following acute myocardial infarction: Secondary | ICD-10-CM

## 2013-11-24 LAB — POCT INR: INR: 2.3

## 2013-11-26 ENCOUNTER — Emergency Department (INDEPENDENT_AMBULATORY_CARE_PROVIDER_SITE_OTHER): Payer: Medicare Other

## 2013-11-26 ENCOUNTER — Encounter (HOSPITAL_COMMUNITY): Payer: Self-pay | Admitting: Emergency Medicine

## 2013-11-26 ENCOUNTER — Emergency Department (INDEPENDENT_AMBULATORY_CARE_PROVIDER_SITE_OTHER)
Admission: EM | Admit: 2013-11-26 | Discharge: 2013-11-26 | Disposition: A | Discharge status: Home / Self Care | Payer: Medicare Other | Source: Home / Self Care | Attending: Emergency Medicine | Admitting: Emergency Medicine

## 2013-11-26 DIAGNOSIS — S92415A Nondisplaced fracture of proximal phalanx of left great toe, initial encounter for closed fracture: Secondary | ICD-10-CM

## 2013-11-26 DIAGNOSIS — S92919A Unspecified fracture of unspecified toe(s), initial encounter for closed fracture: Secondary | ICD-10-CM

## 2013-11-26 DIAGNOSIS — E1139 Type 2 diabetes mellitus with other diabetic ophthalmic complication: Secondary | ICD-10-CM | POA: Diagnosis not present

## 2013-11-26 DIAGNOSIS — Z961 Presence of intraocular lens: Secondary | ICD-10-CM | POA: Diagnosis not present

## 2013-11-26 DIAGNOSIS — H35359 Cystoid macular degeneration, unspecified eye: Secondary | ICD-10-CM | POA: Diagnosis not present

## 2013-11-26 NOTE — ED Notes (Signed)
Left foot pain.  Reports tripping over walker 2 weeks ago.  Patient did not fall at that time, but foot has been painful, bruised and swollen.  Over the 2 weeks bruising in different areas of foot and swelling continues.  Pedal pulse 2 plus.  Patient is a diabetic and concerned for any trauma to feet.  No visible break in skin integrity

## 2013-11-26 NOTE — Discharge Instructions (Signed)
Buddy Taping of Toes °We have taped your toes together to keep them from moving. This is called "buddy taping" since we used a part of your own body to keep the injured part still. We placed soft padding between your toes to keep them from rubbing against each other. Buddy taping will help with healing and to reduce pain. Keep your toes buddy taped together for as long as directed by your caregiver. °HOME CARE INSTRUCTIONS  °· Raise your injured area above the level of your heart while sitting or lying down. Prop it up with pillows. °· An ice pack used every twenty minutes, while awake, for the first one to two days may be helpful. Put ice in a plastic bag and put a towel between the bag and your skin. °· Watch for signs that the taping is too tight. These signs may be: °¨ Numbness of your taped toes. °¨ Coolness of your taped toes. °¨ Color change in the area beyond the tape. °¨ Increased pain. °· If you have any of these signs, loosen or rewrap the tape. If you need to loosen or rewrap the buddy tape, make sure you use the padding again. °SEEK IMMEDIATE MEDICAL CARE IF:  °· You have worse pain, swelling, inflammation (soreness), drainage or bleeding after you rewrap the tape. °· Any new problems occur. °MAKE SURE YOU:  °· Understand these instructions. °· Will watch your condition. °· Will get help right away if you are not doing well or get worse. °Document Released: 02/02/2004 Document Revised: 07/23/2011 Document Reviewed: 04/27/2008 °[employer]® Patient Information ©2015 [employer]. This information is not intended to replace advice given to you by your health care provider. Make sure you discuss any questions you have with your health care provider. ° °

## 2013-11-26 NOTE — ED Provider Notes (Signed)
Chief Complaint   Chief Complaint  Patient presents with  . Foot Pain    History of Present Illness   Destiny [name] is a 73 year old female who struck her left great toe on a piece of furniture about 2 weeks ago. She's had swelling and bruising of the left great toe MTP joint. She's able to walk, but it hurts to put pressure on it or to move the toe. She denies any numbness or tingling of the foot. There is some discoloration. No ulcerations.  Review of Systems   Other than as noted above, the patient denies any of the following symptoms: Systemic:  No fevers or chills. Musculoskeletal:  No joint pain or arthritis.  Neurological:  No muscular weakness, paresthesias.   [place]   Past medical history, family history, social history, meds, and allergies were reviewed.   She's allergic to sulfa she has diabetes, a pacemaker and defibrillator, and congestive heart failure. Other medical problems include left bundle branch block, carotid stenosis, hypercholesterolemia, hypertension, chronic back pain, ischemic cardiomyopathy, coronary artery disease, and chronic kidney disease. She also has hypoxemia requiring supplemental oxygen, hypothyroidism, a history of non-Hodgkin's lymphoma, obstructive sleep apnea, gout, paroxysmal atrial fibrillation, morbid obesity, and hypercalcemia. Her current meds include albuterol, amlodipine, carvedilol, enalapril, Lexapro, Flonase, Neurontin, NPH insulin, regular insulin, Synthroid, Ativan, Zocor, Coumadin, and tramadol.  Physical  Examination     Vital signs:  BP 118/62  Pulse 62  Temp(Src) 98.6 F (37 C) (Oral)  Resp 20  SpO2 94% Gen:  Alert and oriented times 3.  In no distress. Musculoskeletal:  Exam of the foot reveals no swelling, bruising, and pain to palpation over the MTP joint of the left great toe. It hurts slightly to move the toe.  Otherwise, all joints had a full a ROM with no swelling, bruising or deformity.  No edema, pulses full.  Extremities were warm and pink.  Capillary refill was brisk.  Skin:  Clear, warm and dry.  No rash. Neuro:  Alert and oriented times 3.  Muscle strength was normal.  Sensation was intact to light touch.    Radiology   Dg Foot Complete Left  11/26/2013   CLINICAL DATA:  Left foot injury.  EXAM: LEFT FOOT - COMPLETE 3+ VIEW  COMPARISON:  None.  FINDINGS: Moderately displaced fracture is seen involving the proximal base of the first proximal phalanx. Joint spaces are intact. Spurring of posterior calcaneus is noted. No radiopaque foreign body or soft tissue abnormality is noted.  IMPRESSION: Moderately displaced first proximal phalangeal fracture.   Electronically Signed   By: Sabino Dick M.D.   On: 11/26/2013 12:13   I reviewed the images independently and personally and concur with the radiologist's findings.  [facility]   The toe was buddy taped and she was placed in a postoperative boot.  Assessment   The encounter diagnosis was Closed nondisplaced fracture of proximal phalanx of left great toe, initial encounter.  Plan    1.  Meds:  The following meds were prescribed:   Discharge Medication List as of 11/26/2013 12:39 PM      2.  Patient Education/Counseling:  The patient was given appropriate handouts, self care instructions, and instructed in symptomatic relief including rest and activity, elevation, application of ice and compression.  She was told to continue the buddy taping and the postoperative boot for another 4 weeks. If still painful at that time suggested followup with her orthopedist.  3.  Follow up:  The patient was told to follow up here if no better in 3 to 4 days, or sooner if becoming worse in any way, and given some red flag symptoms such as worsening pain or neurological symptoms which would prompt immediate return.  Follow up here as necessary.       Harden Mo, MD 11/26/13 [phone]

## 2013-11-30 ENCOUNTER — Encounter: Payer: Self-pay | Admitting: Pulmonary Disease

## 2013-11-30 ENCOUNTER — Ambulatory Visit (INDEPENDENT_AMBULATORY_CARE_PROVIDER_SITE_OTHER): Payer: Medicare Other | Admitting: Pulmonary Disease

## 2013-11-30 ENCOUNTER — Telehealth: Payer: Self-pay

## 2013-11-30 ENCOUNTER — Ambulatory Visit (INDEPENDENT_AMBULATORY_CARE_PROVIDER_SITE_OTHER)
Admission: RE | Admit: 2013-11-30 | Discharge: 2013-11-30 | Disposition: A | Discharge status: Home / Self Care | Payer: Medicare Other | Source: Ambulatory Visit | Attending: Pulmonary Disease | Admitting: Pulmonary Disease

## 2013-11-30 ENCOUNTER — Other Ambulatory Visit (INDEPENDENT_AMBULATORY_CARE_PROVIDER_SITE_OTHER): Payer: Medicare Other

## 2013-11-30 VITALS — BP 110/60 | HR 61 | Temp 97.9°F | Ht 64.0 in | Wt 258.6 lb

## 2013-11-30 DIAGNOSIS — C8589 Other specified types of non-Hodgkin lymphoma, extranodal and solid organ sites: Secondary | ICD-10-CM

## 2013-11-30 DIAGNOSIS — R0602 Shortness of breath: Secondary | ICD-10-CM | POA: Diagnosis not present

## 2013-11-30 DIAGNOSIS — E1149 Type 2 diabetes mellitus with other diabetic neurological complication: Secondary | ICD-10-CM | POA: Diagnosis not present

## 2013-11-30 DIAGNOSIS — R0609 Other forms of dyspnea: Secondary | ICD-10-CM

## 2013-11-30 DIAGNOSIS — E785 Hyperlipidemia, unspecified: Secondary | ICD-10-CM

## 2013-11-30 DIAGNOSIS — E039 Hypothyroidism, unspecified: Secondary | ICD-10-CM | POA: Diagnosis not present

## 2013-11-30 DIAGNOSIS — R0989 Other specified symptoms and signs involving the circulatory and respiratory systems: Secondary | ICD-10-CM

## 2013-11-30 DIAGNOSIS — I10 Essential (primary) hypertension: Secondary | ICD-10-CM

## 2013-11-30 LAB — RENAL FUNCTION PANEL
Albumin: 3.5 g/dL (ref 3.5–5.2)
BUN: 46 mg/dL — AB (ref 6–23)
CO2: 28 meq/L (ref 19–32)
Calcium: 8.7 mg/dL (ref 8.4–10.5)
Chloride: 105 mEq/L (ref 96–112)
Creatinine, Ser: 1.7 mg/dL — ABNORMAL HIGH (ref 0.4–1.2)
GFR: 31.26 mL/min — ABNORMAL LOW (ref >60.00)
Glucose, Bld: 129 mg/dL — ABNORMAL HIGH (ref 70–99)
PHOSPHORUS: 3.5 mg/dL (ref 2.3–4.6)
POTASSIUM: 4.6 meq/L (ref 3.5–5.1)
SODIUM: 139 meq/L (ref 135–145)

## 2013-11-30 LAB — CBC
HCT: 35.3 % — ABNORMAL LOW (ref 36.0–46.0)
HEMOGLOBIN: 11.9 g/dL — AB (ref 12.0–15.0)
MCHC: 33.8 g/dL (ref 30.0–36.0)
MCV: 88.7 fl (ref 78.0–100.0)
Platelets: 170 10*3/uL (ref 150.0–400.0)
RBC: 3.99 Mil/uL (ref 3.87–5.11)
RDW: 14.3 % (ref 11.5–15.5)
WBC: 7.5 10*3/uL (ref 4.0–10.5)

## 2013-11-30 LAB — LIPID PANEL
CHOL/HDL RATIO: 5
Cholesterol: 178 mg/dL (ref 0–200)
HDL: 38.6 mg/dL — ABNORMAL LOW (ref >39.00)
NONHDL: 139.4
TRIGLYCERIDES: 422 mg/dL — AB (ref 0.0–149.0)
VLDL: 84.4 mg/dL — ABNORMAL HIGH (ref 0.0–40.0)

## 2013-11-30 LAB — HEPATIC FUNCTION PANEL
ALK PHOS: 65 U/L (ref 39–117)
ALT: 14 U/L (ref 0–35)
AST: 17 U/L (ref 0–37)
Albumin: 3.5 g/dL (ref 3.5–5.2)
Bilirubin, Direct: 0.1 mg/dL (ref 0.0–0.3)
Total Bilirubin: 0.4 mg/dL (ref 0.2–1.2)
Total Protein: 6.4 g/dL (ref 6.0–8.3)

## 2013-11-30 LAB — TSH: TSH: 1.98 u[IU]/mL (ref 0.35–4.50)

## 2013-11-30 NOTE — Patient Instructions (Signed)
Will check a chest xray today for completeness, and will call with results Keep working on weight loss, stay on cpap. Would discontinue your albuterol inhaler, since we do not see any airflow obstruction on breathing tests.  Keep followup with me for your sleep apnea.

## 2013-11-30 NOTE — Telephone Encounter (Signed)
Labs re-ordered

## 2013-11-30 NOTE — Telephone Encounter (Signed)
Message copied by Varney Daily on Mon Nov 30, 2013  9:33 AM ------      Message from: Irven Baltimore      Created: Mon Nov 30, 2013  9:31 AM       [facility] lab states that patient is there now and needs labs order put in system ------

## 2013-11-30 NOTE — Progress Notes (Signed)
   Subjective:    [name], female    DOB: 05/22/1940, 73 y.o.   MRN: [id number]  HPI The patient is a 73 year old female who I've been asked to see for dyspnea on exertion. She has known obstructive sleep apnea for which she is on CPAP, and also significant underlying cardiac disease. She has a history of an ischemic cardiomyopathy, as well as biventricular ICD implantation. She has had a recent echo that showed an ejection fraction of 40% with diffuse hypokinesis, as well as diastolic dysfunction. She had moderate left atrial dilatation, and they were unable to estimate her pulmonary artery pressures. She has a history of chronic congestive heart failure, with a chronically elevated BNP.  She also has chronic renal insufficiency, is oxygen dependent, and is on chronic anticoagulation for paroxysmal atrial fibrillation. She has a history of chronic dyspnea on exertion, but has not seen a worsening over the last one to 2 years. She will get short of breath just walking through her house, and is unable to walk up a flight of stairs without significant assistance. She is unable to do light housework or bringing groceries in from the car without being severely short of breath. She denies any pleuritic chest pain, nor has she had significantly worsening lower extremity edema. The patient has never smoked, but was told around the age of 68 that she may have asthma. She denies any chest congestion, cough, or mucus production. She has had intermittent worsening of her lower extremity edema, for which she is on sliding scale Lasix. The patient is also morbidly obese, and is very deconditioned with musculoskeletal debility.   Review of Systems  Constitutional: Negative for fever and unexpected weight change.  HENT: Positive for congestion. Negative for dental problem, ear pain, nosebleeds, postnasal drip, rhinorrhea, sinus pressure, sneezing, sore throat and trouble swallowing.   Eyes: Negative  for redness and itching.  Respiratory: Positive for shortness of breath. Negative for cough, chest tightness and wheezing.   Cardiovascular: Negative for palpitations and leg swelling.  Gastrointestinal: Negative for nausea and vomiting.  Genitourinary: Negative for dysuria.  Musculoskeletal: Negative for joint swelling.  Skin: Negative for rash.  Neurological: Negative for headaches.  Hematological: Does not bruise/bleed easily.  Psychiatric/Behavioral: Positive for dysphoric mood. The patient is nervous/anxious.        Objective:   Physical Exam Constitutional: morbidly obese female, no acute distress  HENT:  Nares patent without discharge  Oropharynx without exudate, palate and uvula are moderately elongated  Eyes:  Perrla, eomi, no scleral icterus  Neck:  No JVD, no TMG  Cardiovascular:  Normal rate, regular rhythm, no rubs or gallops.  3/6 sem        Intact distal pulses but decreased.   Pulmonary :  Normal breath sounds, no stridor or respiratory distress   No rales, rhonchi, or wheezing  Abdominal:  Soft, nondistended, bowel sounds present.  No tenderness noted.   Musculoskeletal:  mild lower extremity edema noted.  Lymph Nodes:  No cervical lymphadenopathy noted  Skin:  No cyanosis noted  Neurologic:  Alert, appropriate, moves all 4 extremities without obvious deficit.         Assessment & Plan:

## 2013-11-30 NOTE — Assessment & Plan Note (Signed)
The patient has severe dyspnea on exertion that I think is multifactorial. She has significant underlying cardiac disease, his morbidly obese with restrictive physiology on her spirometry, and also is deconditioned with musculoskeletal issues. Her spirometry does not show any airflow obstruction, and her lungs are totally clear today. I would find it unlikely that she has asthma, and would discontinue her albuterol inhaler. She is also on chronic anticoagulation, and therefore chronic thromboembolic disease is unlikely. I will check a chest x-ray today for completeness, and I have stressed to her the importance of aggressive weight loss and to work on conditioning as much as she can.

## 2013-12-01 LAB — PTH, INTACT AND CALCIUM
Calcium: 8.3 mg/dL — ABNORMAL LOW (ref 8.4–10.5)
PTH: 107 pg/mL — ABNORMAL HIGH (ref 14.0–72.0)

## 2013-12-07 ENCOUNTER — Telehealth: Payer: Self-pay

## 2013-12-07 ENCOUNTER — Encounter: Payer: Self-pay | Admitting: Family Medicine

## 2013-12-07 ENCOUNTER — Ambulatory Visit (INDEPENDENT_AMBULATORY_CARE_PROVIDER_SITE_OTHER): Payer: Medicare Other | Admitting: Family Medicine

## 2013-12-07 VITALS — BP 126/64 | HR 61 | Temp 98.2°F | Ht 64.0 in | Wt 257.0 lb

## 2013-12-07 DIAGNOSIS — E1139 Type 2 diabetes mellitus with other diabetic ophthalmic complication: Secondary | ICD-10-CM

## 2013-12-07 DIAGNOSIS — J189 Pneumonia, unspecified organism: Secondary | ICD-10-CM

## 2013-12-07 DIAGNOSIS — G4733 Obstructive sleep apnea (adult) (pediatric): Secondary | ICD-10-CM

## 2013-12-07 DIAGNOSIS — E039 Hypothyroidism, unspecified: Secondary | ICD-10-CM | POA: Diagnosis not present

## 2013-12-07 DIAGNOSIS — I4891 Unspecified atrial fibrillation: Secondary | ICD-10-CM | POA: Diagnosis not present

## 2013-12-07 DIAGNOSIS — I10 Essential (primary) hypertension: Secondary | ICD-10-CM

## 2013-12-07 DIAGNOSIS — E1149 Type 2 diabetes mellitus with other diabetic neurological complication: Secondary | ICD-10-CM

## 2013-12-07 DIAGNOSIS — I480 Paroxysmal atrial fibrillation: Secondary | ICD-10-CM

## 2013-12-07 DIAGNOSIS — E11319 Type 2 diabetes mellitus with unspecified diabetic retinopathy without macular edema: Secondary | ICD-10-CM

## 2013-12-07 DIAGNOSIS — E785 Hyperlipidemia, unspecified: Secondary | ICD-10-CM

## 2013-12-07 DIAGNOSIS — D649 Anemia, unspecified: Secondary | ICD-10-CM

## 2013-12-07 DIAGNOSIS — I2589 Other forms of chronic ischemic heart disease: Secondary | ICD-10-CM

## 2013-12-07 NOTE — Telephone Encounter (Signed)
Faxed results to [facility] (attn: Dr Marval Regal) at [phone]

## 2013-12-07 NOTE — Assessment & Plan Note (Signed)
Follows with Dr

## 2013-12-07 NOTE — Assessment & Plan Note (Signed)
RRR today 

## 2013-12-07 NOTE — Telephone Encounter (Signed)
Lab results faxed.

## 2013-12-07 NOTE — Patient Instructions (Addendum)
Add PTH to next labs  Destiny [name]'s Colon Health probiotic Benefiber powder twice daily followed by fluids  Encouraged increased hydration and fiber in diet. Daily probiotics. If bowels not moving can use MOM 2 tbls po in 4 oz of warm prune juice by mouth every 2-3 days. If no results then repeat in 4 hours with  Dulcolax suppository pr, may repeat again in 4 more hours as needed. Seek care if symptoms worsen. Consider daily Miralax and/or Dulcolax if symptoms persist.    Constipation Constipation is when a person has fewer than three bowel movements a week, has difficulty having a bowel movement, or has stools that are dry, hard, or larger than normal. As people grow older, constipation is more common. If you try to fix constipation with medicines that make you have a bowel movement (laxatives), the problem may get worse. Long-term laxative use may cause the muscles of the colon to become weak. A low-fiber diet, not taking in enough fluids, and taking certain medicines may make constipation worse.  CAUSES   Certain medicines, such as antidepressants, pain medicine, iron supplements, antacids, and water pills.   Certain diseases, such as diabetes, irritable bowel syndrome (IBS), thyroid disease, or depression.   Not drinking enough water.   Not eating enough fiber-rich foods.   Stress or travel.   Lack of physical activity or exercise.   Ignoring the urge to have a bowel movement.   Using laxatives too much.  SIGNS AND SYMPTOMS   Having fewer than three bowel movements a week.   Straining to have a bowel movement.   Having stools that are hard, dry, or larger than normal.   Feeling full or bloated.   Pain in the lower abdomen.   Not feeling relief after having a bowel movement.  DIAGNOSIS  Your health care provider will take a medical history and perform a physical exam. Further testing may be done for severe constipation. Some tests may include:  A barium enema  X-ray to examine your rectum, colon, and, sometimes, your small intestine.   A sigmoidoscopy to examine your lower colon.   A colonoscopy to examine your entire colon. TREATMENT  Treatment will depend on the severity of your constipation and what is causing it. Some dietary treatments include drinking more fluids and eating more fiber-rich foods. Lifestyle treatments may include regular exercise. If these diet and lifestyle recommendations do not help, your health care provider may recommend taking over-the-counter laxative medicines to help you have bowel movements. Prescription medicines may be prescribed if over-the-counter medicines do not work.  HOME CARE INSTRUCTIONS   Eat foods that have a lot of fiber, such as fruits, vegetables, whole grains, and beans.  Limit foods high in fat and processed sugars, such as french fries, hamburgers, cookies, candies, and soda.   A fiber supplement may be added to your diet if you cannot get enough fiber from foods.   Drink enough fluids to keep your urine clear or pale yellow.   Exercise regularly or as directed by your health care provider.   Go to the restroom when you have the urge to go. Do not hold it.   Only take over-the-counter or prescription medicines as directed by your health care provider. Do not take other medicines for constipation without talking to your health care provider first.  [place] IF:   You have bright red blood in your stool.   Your constipation lasts for more than 4 days or gets worse.  You have abdominal or rectal pain.   You have thin, pencil-like stools.   You have unexplained weight loss. MAKE SURE YOU:   Understand these instructions.  Will watch your condition.  Will get help right away if you are not doing well or get worse. Document Released: 01/27/2004 Document Revised: 05/05/2013 Document Reviewed: 02/09/2013 [facility] Patient Information 2015 [place], [place]. This  information is not intended to replace advice given to you by your health care provider. Make sure you discuss any questions you have with your health care provider.

## 2013-12-07 NOTE — Assessment & Plan Note (Signed)
Well controlled, no changes to meds. Encouraged heart healthy diet such as the DASH diet and exercise as tolerated.  °

## 2013-12-07 NOTE — Telephone Encounter (Signed)
Message copied by Varney Daily on Mon Dec 07, 2013  9:51 AM ------      Message from: Penni Homans A      Created: Mon Dec 07, 2013  9:39 AM       Please send a copy of 11/30/13 labs to her endocrinologist Dr Chalmers Cater as well. Her PTH was hi ------

## 2013-12-07 NOTE — Telephone Encounter (Signed)
Message copied by Varney Daily on Mon Dec 07, 2013  9:58 AM ------      Message from: Penni Homans A      Created: Mon Dec 07, 2013  9:25 AM       Need a copy of last 2 sets of labs sent to her nephrologist Dr Lavonna Rua (sp?) please. Dr B ------

## 2013-12-07 NOTE — Progress Notes (Signed)
Pre visit review using our clinic review tool, if applicable. No additional management support is needed unless otherwise documented below in the visit note. 

## 2013-12-07 NOTE — Assessment & Plan Note (Signed)
Doing very well, no recent recurrence

## 2013-12-07 NOTE — Assessment & Plan Note (Signed)
On Levothyroxine, continue to monitor 

## 2013-12-13 ENCOUNTER — Encounter: Payer: Self-pay | Admitting: Family Medicine

## 2013-12-13 DIAGNOSIS — D649 Anemia, unspecified: Secondary | ICD-10-CM | POA: Insufficient documentation

## 2013-12-13 HISTORY — DX: Anemia, unspecified: D64.9

## 2013-12-13 NOTE — Assessment & Plan Note (Signed)
Mild, proceed with IFOB and monitor

## 2013-12-13 NOTE — Assessment & Plan Note (Signed)
Follows with endocrinology. hgba1c acceptable, minimize simple carbs. Increase exercise as tolerated. Continue current meds

## 2013-12-13 NOTE — Assessment & Plan Note (Signed)
Using CPAP routinely 

## 2013-12-13 NOTE — Progress Notes (Signed)
[name], female   DOB: December 31, 1940, 73 y.o.   MRN: [id number] Destiny [name] [id number] [birth year] 12/13/2013      Progress Note-Follow Up  Subjective  Chief Complaint  Chief Complaint  Patient presents with  . Follow-up    10 week    HPI  Patient is a 73 year old female in today for routine medical care. She is doing fairly well although she is struggling with some right hip pain. No falls or raicular symptoms. No incontinence. Previously struggled with mild diarrhea now noting some mild constipation. Moves only a small stool which is hard and sometimes difficult to pass. No obvious blood. Follows with endocrinology and denies any recent trouble with sugar. No polyuria or polydipsia. Also follows with nephrology, no recent changes. Denies CP/palp/SOB/HA/congestion/fevers/GI or GU c/o. Taking meds as prescribed  Past Medical History  Diagnosis Date  . LBBB (left bundle branch block)   . Carotid stenosis     a. 10/19/2011 carotid duplex - Mild hard plaque bilaterally. Stable 40-59% bilateral ICA stenosis. Carotid US (04/2013):  Bilateral 40-59% ICA.  F/u 1 year  . Dyslipidemia   . Hypertension   . Chronic combined systolic and diastolic CHF (congestive heart failure)     a. EF as low as 25% in 2006;  b. EF 60-65% in 12/2011;  c. 02/2013 Echo: EF 35-40, mod mid-dist antsept HK, Gr 2 DD, mild LVH.  . Back pain, chronic     "just when I walk; mass on 3rd and 4th vertebrae right lower back"  . Cardiomyopathy, ischemic     a. 2012 s/p SJM 3231-40 Uni BiV ICD, ser # [id number].  Marland Kitchen CAD (coronary artery disease)     a. 05/2001 CABG x4: LIMA->LAD, VG->D1, VG->D2, VG->RCA.  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 CKD (chronic kidney disease), stage III   . Biventricular implantable cardiac defibrillator in situ 2007, 2012    a. 2007;  b. 2012 Gen change: SJM 3231-40 Uni BiV ICD, ser # [id number].  Marland Kitchen Hypoxemia requiring supplemental oxygen   . Candidiasis of  vulva and vagina   . Hypothyroidism   . 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 Benay Spice, in remission  . History of pneumonia 12/27/11    "3 times; it's been a long time ago"  . OSA on CPAP   . Gout ~ 08/2011  . PAF (paroxysmal atrial fibrillation)     on Coumadin  . DM (diabetes mellitus), type 2 with complications     insulin dependent, retinopathy, neuropathy  . Mural thrombus of left ventricle     before 2003, while on Coumadin, No h/o CVA  . Morbid obesity with BMI of 45.0-49.9, adult     Ht. 5'4". BMI 47.2  . Vitamin D deficiency 06/09/2012    historical  . Olecranon bursitis of right elbow 12/29/2012  . History of recurrent UTIs   . Diabetes 05/24/2013  . Hypercalcemia 09/26/2013  . Anemia, unspecified 12/13/2013    Past Surgical History  Procedure Laterality Date  . Cardiac catheterization  06/03/01  . Cholecystectomy  1970  . Tubal ligation  1972  . Abdominal hysterectomy  1982  . Insert / replace / remove pacemaker  2007; 2012    w/AICD  . Coronary artery bypass graft  2003    CABG X4  . Cataract extraction      left eye    Family  History  Problem Relation Age of Onset  . Kidney cancer Mother     kidney and female repo - died @ 73  . Heart disease Father     died @ 71  . Asthma Mother   . Stroke Father     History   Social History  . Marital Status: Married    Spouse Name: N/A    Number of Children: Y  . Years of Education: N/A   Occupational History  . 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    Social History Main Topics  . Smoking status: Never Smoker   . Smokeless tobacco: Never Used  . Alcohol Use: No  . Drug Use: No  . Sexual Activity: Not Currently   Other Topics Concern  . Not on file   Social History Narrative   Lives in [place] with her husband.  She does not routinely exercise or adhere to any particular diet.      Current Outpatient Prescriptions on File Prior to Visit  Medication Sig  Dispense Refill  . albuterol (PROVENTIL HFA;VENTOLIN HFA) 108 (90 BASE) MCG/ACT inhaler Inhale 2 puffs into the lungs every 6 (six) hours as needed for wheezing or shortness of breath.  1 Inhaler  1  . amLODipine (NORVASC) 5 MG tablet Take 1 tablet (5 mg total) by mouth daily.  90 tablet  0  . carvedilol (COREG) 25 MG tablet Take 1 tablet (25 mg total) by mouth 2 (two) times daily with a meal.  60 tablet  6  . enalapril (VASOTEC) 2.5 MG tablet Take 1 tablet (2.5 mg total) by mouth 2 (two) times daily.  60 tablet  6  . escitalopram (LEXAPRO) 10 MG tablet Take 1 tablet (10 mg total) by mouth daily.  90 tablet  0  . fluticasone (FLONASE) 50 MCG/ACT nasal spray Place 2 sprays into both nostrils daily as needed for allergies or rhinitis. As needed for nasal stuffiness.  16 g  0  . gabapentin (NEURONTIN) 300 MG capsule Take 1 capsule (300 mg total) by mouth 2 (two) times daily.  180 capsule  0  . glucose blood (FREESTYLE LITE) test strip DX: 250.60  Check sugars bid and as needed  100 each  2  . insulin NPH (HUMULIN N,NOVOLIN N) 100 UNIT/ML injection Inject 25-44 Units into the skin 2 (two) times daily before a meal. Take 44 units in the morning and 25 units in the evening      . insulin regular (NOVOLIN R,HUMULIN R) 100 units/mL injection Change to 15 units three times a with meals (to cover all meals--this is the same total amount but spread out)  10 mL  12  . levothyroxine (SYNTHROID, LEVOTHROID) 25 MCG tablet Take 1 tablet (25 mcg total) by mouth daily before breakfast.  90 tablet  0  . LORazepam (ATIVAN) 0.5 MG tablet TAKE ONE TABLET BY MOUTH TWICE DAILY AS NEEDED FOR ANXIETY OR SLEEP  45 tablet  0  . NON FORMULARY Oxygen---24 hour      . simvastatin (ZOCOR) 20 MG tablet Take 1 tablet (20 mg total) by mouth at bedtime.  90 tablet  0  . torsemide (DEMADEX) 20 MG tablet Take 1 tab po daily and may take an extra tab for 3# weight gain, swelling or SOB  135 tablet  3  . traMADol (ULTRAM) 50 MG tablet  Take 1 tablet (50 mg total) by mouth every 12 (twelve) hours as needed.  60 tablet  1  . Vitamin D, Ergocalciferol, (DRISDOL) 50000 UNITS CAPS capsule Take 50,000 Units by mouth every 7 (seven) days.      Marland Kitchen warfarin (COUMADIN) 5 MG tablet Take as directed by Anticoagulation clinic  45 tablet  3   No current facility-administered medications on file prior to visit.    Allergies  Allergen Reactions  . Sulfonamide Derivatives Other (See Comments)    Unknown; "childhood allergy/mother"    Review of Systems  Review of Systems  Constitutional: Negative for fever and malaise/fatigue.  HENT: Negative for congestion.   Eyes: Negative for discharge.  Respiratory: Negative for shortness of breath.   Cardiovascular: Negative for chest pain, palpitations and leg swelling.  Gastrointestinal: Negative for nausea, abdominal pain and diarrhea.  Genitourinary: Negative for dysuria.  Musculoskeletal: Negative for falls.  Skin: Negative for rash.  Neurological: Negative for loss of consciousness and headaches.  Endo/Heme/Allergies: Negative for polydipsia.  Psychiatric/Behavioral: Negative for depression and suicidal ideas. The patient is not nervous/anxious and does not have insomnia.     Objective  BP 126/64  Pulse 61  Temp(Src) 98.2 F (36.8 C) (Oral)  Ht 5\' 4"  (1.626 m)  Wt 257 lb 0.6 oz (116.593 kg)  BMI 44.10 kg/m2  SpO2 92%  Physical Exam  Physical Exam  Constitutional: She is oriented to person, place, and time and well-developed, well-nourished, and in no distress. No distress.  HENT:  Head: Normocephalic and atraumatic.  Eyes: Conjunctivae are normal.  Neck: Neck supple. No thyromegaly present.  Cardiovascular: Normal rate and regular rhythm.   Murmur heard. Pulmonary/Chest: Effort normal and breath sounds normal. She has no wheezes.  Abdominal: She exhibits no distension and no mass.  Musculoskeletal: She exhibits no edema.  Lymphadenopathy:    She has no cervical  adenopathy.  Neurological: She is alert and oriented to person, place, and time.  Skin: Skin is warm and dry. No rash noted. She is not diaphoretic.  Psychiatric: Memory, affect and judgment normal.    Lab Results  Component Value Date   TSH 1.98 11/30/2013   Lab Results  Component Value Date   WBC 7.5 11/30/2013   HGB 11.9* 11/30/2013   HCT 35.3* 11/30/2013   MCV 88.7 11/30/2013   PLT 170.0 11/30/2013   Lab Results  Component Value Date   CREATININE 1.7* 11/30/2013   BUN 46* 11/30/2013   NA 139 11/30/2013   K 4.6 11/30/2013   CL 105 11/30/2013   CO2 28 11/30/2013   Lab Results  Component Value Date   ALT 14 11/30/2013   AST 17 11/30/2013   ALKPHOS 65 11/30/2013   BILITOT 0.4 11/30/2013   Lab Results  Component Value Date   CHOL 178 11/30/2013   Lab Results  Component Value Date   HDL 38.60* 11/30/2013   Lab Results  Component Value Date   LDLCALC 41 07/27/2013   Lab Results  Component Value Date   TRIG 422.0* 11/30/2013   Lab Results  Component Value Date   CHOLHDL 5 11/30/2013     Assessment & Plan  HYPERTENSION Well controlled, no changes to meds. Encouraged heart healthy diet such as the DASH diet and exercise as tolerated.   ATRIAL FIBRILLATION, PAROXYSMAL RRR today  CAP (community acquired pneumonia) Doing very well, no recent recurrence  Hypothyroidism On Levothyroxine, continue to monitor  DIABETES MELLITUS, WITH AUTONOMIC NEUROPATHY Follows with Dr   OSA (obstructive sleep apnea) Using CPAP routinely  DYSLIPIDEMIA Tolerating statin, encouraged heart healthy diet, avoid trans fats, minimize  simple carbs and saturated fats. Increase exercise as tolerated  Diabetes Follows with endocrinology. hgba1c acceptable, minimize simple carbs. Increase exercise as tolerated. Continue current meds  Anemia, unspecified Mild, proceed with IFOB and monitor

## 2013-12-13 NOTE — Assessment & Plan Note (Signed)
Tolerating statin, encouraged heart healthy diet, avoid trans fats, minimize simple carbs and saturated fats. Increase exercise as tolerated 

## 2013-12-22 ENCOUNTER — Ambulatory Visit (INDEPENDENT_AMBULATORY_CARE_PROVIDER_SITE_OTHER): Payer: Medicare Other | Admitting: Pharmacist

## 2013-12-22 DIAGNOSIS — I4891 Unspecified atrial fibrillation: Secondary | ICD-10-CM | POA: Diagnosis not present

## 2013-12-22 DIAGNOSIS — Z5181 Encounter for therapeutic drug level monitoring: Secondary | ICD-10-CM | POA: Diagnosis not present

## 2013-12-22 DIAGNOSIS — I238 Other current complications following acute myocardial infarction: Secondary | ICD-10-CM | POA: Diagnosis not present

## 2013-12-22 DIAGNOSIS — I6350 Cerebral infarction due to unspecified occlusion or stenosis of unspecified cerebral artery: Secondary | ICD-10-CM | POA: Diagnosis not present

## 2013-12-22 DIAGNOSIS — I480 Paroxysmal atrial fibrillation: Secondary | ICD-10-CM

## 2013-12-22 LAB — POCT INR: INR: 2.9

## 2013-12-28 NOTE — Progress Notes (Signed)
Destiny [name], OTR/L [phone]

## 2013-12-31 ENCOUNTER — Ambulatory Visit (INDEPENDENT_AMBULATORY_CARE_PROVIDER_SITE_OTHER): Payer: Medicare Other | Admitting: *Deleted

## 2013-12-31 DIAGNOSIS — I5022 Chronic systolic (congestive) heart failure: Secondary | ICD-10-CM | POA: Diagnosis not present

## 2013-12-31 DIAGNOSIS — I255 Ischemic cardiomyopathy: Secondary | ICD-10-CM

## 2013-12-31 DIAGNOSIS — I2589 Other forms of chronic ischemic heart disease: Secondary | ICD-10-CM | POA: Diagnosis not present

## 2013-12-31 LAB — MDC_IDC_ENUM_SESS_TYPE_REMOTE
Battery Remaining Longevity: 38 mo
Brady Statistic AP VS Percent: 1 %
Brady Statistic AS VP Percent: 7.2 %
Brady Statistic AS VS Percent: 1 %
Date Time Interrogation Session: 20150820132655
HighPow Impedance: 48 Ohm
Lead Channel Impedance Value: 290 Ohm
Lead Channel Impedance Value: 310 Ohm
Lead Channel Impedance Value: 390 Ohm
Lead Channel Pacing Threshold Amplitude: 1.375 V
Lead Channel Pacing Threshold Pulse Width: 0.5 ms
Lead Channel Pacing Threshold Pulse Width: 0.6 ms
Lead Channel Setting Pacing Amplitude: 1.75 V
Lead Channel Setting Pacing Amplitude: 2.375
MDC IDC MSMT BATTERY REMAINING PERCENTAGE: 51 %
MDC IDC MSMT BATTERY VOLTAGE: 2.92 V
MDC IDC MSMT LEADCHNL RA PACING THRESHOLD AMPLITUDE: 0.75 V
MDC IDC MSMT LEADCHNL RA SENSING INTR AMPL: 2.3 mV
MDC IDC PG SERIAL: 631685
MDC IDC SET LEADCHNL LV PACING PULSEWIDTH: 0.6 ms
MDC IDC SET LEADCHNL RV PACING AMPLITUDE: 2.5 V
MDC IDC SET LEADCHNL RV PACING PULSEWIDTH: 0.5 ms
MDC IDC SET LEADCHNL RV SENSING SENSITIVITY: 0.5 mV
MDC IDC SET ZONE DETECTION INTERVAL: 250 ms
MDC IDC SET ZONE DETECTION INTERVAL: 310 ms
MDC IDC STAT BRADY AP VP PERCENT: 93 %
MDC IDC STAT BRADY RA PERCENT PACED: 92 %
Zone Setting Detection Interval: 345 ms

## 2013-12-31 NOTE — Progress Notes (Signed)
Remote ICD transmission.   

## 2014-01-04 ENCOUNTER — Other Ambulatory Visit: Payer: Self-pay | Admitting: Family Medicine

## 2014-01-04 NOTE — Telephone Encounter (Signed)
Received  Electronic refill request from pharmacy request refills for lorazepam 0.5 mg . Rx last written 11/04/13 #45/0 rf and pt last seen 12/07/13 . Please advise Thanks

## 2014-01-06 DIAGNOSIS — Z23 Encounter for immunization: Secondary | ICD-10-CM | POA: Diagnosis not present

## 2014-01-07 ENCOUNTER — Encounter: Payer: Self-pay | Admitting: Cardiology

## 2014-01-13 ENCOUNTER — Other Ambulatory Visit: Payer: Self-pay

## 2014-01-13 DIAGNOSIS — E785 Hyperlipidemia, unspecified: Secondary | ICD-10-CM

## 2014-01-13 MED ORDER — SIMVASTATIN 20 MG PO TABS: 20.0000 mg | ORAL_TABLET | Freq: Every day | ORAL | Status: DC

## 2014-01-13 MED ORDER — ESCITALOPRAM OXALATE 10 MG PO TABS: 10.0000 mg | ORAL_TABLET | Freq: Every day | ORAL | Status: DC

## 2014-01-13 MED ORDER — LEVOTHYROXINE SODIUM 25 MCG PO TABS: 25.0000 ug | ORAL_TABLET | Freq: Every day | ORAL | Status: DC

## 2014-01-13 MED ORDER — GABAPENTIN 300 MG PO CAPS: 300.0000 mg | ORAL_CAPSULE | Freq: Two times a day (BID) | ORAL | Status: DC

## 2014-01-13 MED ORDER — FLUTICASONE PROPIONATE 50 MCG/ACT NA SUSP: 2.0000 | Freq: Every day | NASAL | Status: AC | PRN

## 2014-01-13 MED ORDER — AMLODIPINE BESYLATE 5 MG PO TABS: 5.0000 mg | ORAL_TABLET | Freq: Every day | ORAL | Status: DC

## 2014-01-14 ENCOUNTER — Encounter: Payer: Self-pay | Admitting: Internal Medicine

## 2014-02-02 ENCOUNTER — Ambulatory Visit (INDEPENDENT_AMBULATORY_CARE_PROVIDER_SITE_OTHER): Payer: Medicare Other | Admitting: Pharmacist Clinician (PhC)/ Clinical Pharmacy Specialist

## 2014-02-02 DIAGNOSIS — I6350 Cerebral infarction due to unspecified occlusion or stenosis of unspecified cerebral artery: Secondary | ICD-10-CM | POA: Diagnosis not present

## 2014-02-02 DIAGNOSIS — I4891 Unspecified atrial fibrillation: Secondary | ICD-10-CM

## 2014-02-02 DIAGNOSIS — I238 Other current complications following acute myocardial infarction: Secondary | ICD-10-CM | POA: Diagnosis not present

## 2014-02-02 DIAGNOSIS — Z5181 Encounter for therapeutic drug level monitoring: Secondary | ICD-10-CM | POA: Diagnosis not present

## 2014-02-02 DIAGNOSIS — I480 Paroxysmal atrial fibrillation: Secondary | ICD-10-CM

## 2014-02-02 LAB — POCT INR: INR: 3.9

## 2014-02-15 ENCOUNTER — Other Ambulatory Visit: Payer: Self-pay | Admitting: Family Medicine

## 2014-02-16 ENCOUNTER — Ambulatory Visit (INDEPENDENT_AMBULATORY_CARE_PROVIDER_SITE_OTHER): Payer: Medicare Other | Admitting: *Deleted

## 2014-02-16 DIAGNOSIS — I639 Cerebral infarction, unspecified: Secondary | ICD-10-CM | POA: Diagnosis not present

## 2014-02-16 DIAGNOSIS — I482 Chronic atrial fibrillation, unspecified: Secondary | ICD-10-CM

## 2014-02-16 DIAGNOSIS — I4891 Unspecified atrial fibrillation: Secondary | ICD-10-CM | POA: Diagnosis not present

## 2014-02-16 DIAGNOSIS — I6350 Cerebral infarction due to unspecified occlusion or stenosis of unspecified cerebral artery: Secondary | ICD-10-CM

## 2014-02-16 DIAGNOSIS — Z5181 Encounter for therapeutic drug level monitoring: Secondary | ICD-10-CM | POA: Diagnosis not present

## 2014-02-16 LAB — POCT INR: INR: 2.7

## 2014-02-16 NOTE — Telephone Encounter (Signed)
Rx request faxed to pharmacy[facility]  

## 2014-02-16 NOTE — Telephone Encounter (Signed)
eScribe request from [facility]  for refill on Lorazepam Last filled - 08.24.15, #[id number] Last AEX - 07.27.15 Next AEX - 3 Mths. Please Advise on refills/SLS

## 2014-02-23 DIAGNOSIS — E1165 Type 2 diabetes mellitus with hyperglycemia: Secondary | ICD-10-CM | POA: Diagnosis not present

## 2014-02-23 DIAGNOSIS — E780 Pure hypercholesterolemia: Secondary | ICD-10-CM | POA: Diagnosis not present

## 2014-02-23 DIAGNOSIS — G609 Hereditary and idiopathic neuropathy, unspecified: Secondary | ICD-10-CM | POA: Diagnosis not present

## 2014-02-23 DIAGNOSIS — I10 Essential (primary) hypertension: Secondary | ICD-10-CM | POA: Diagnosis not present

## 2014-02-23 DIAGNOSIS — E039 Hypothyroidism, unspecified: Secondary | ICD-10-CM | POA: Diagnosis not present

## 2014-02-24 ENCOUNTER — Telehealth: Payer: Self-pay | Admitting: Internal Medicine

## 2014-02-24 NOTE — Telephone Encounter (Signed)
New message      Pt is on warfarin.  Diabetic doctor put her on cephalexin 500mg  bid for 10days.  Will she need to have her warfarin adjusted?

## 2014-02-24 NOTE — Telephone Encounter (Signed)
Called spoke with pt advised Cephalexin is safe to take with Coumadin, no interaction. Pt saw diabetic MD and started on abx for cellulitis.   Advised pt to continue on same dosage of Coumadin and keep scheduled follow-up appt.

## 2014-03-08 ENCOUNTER — Ambulatory Visit (INDEPENDENT_AMBULATORY_CARE_PROVIDER_SITE_OTHER): Payer: Medicare Other | Admitting: Family Medicine

## 2014-03-08 ENCOUNTER — Ambulatory Visit: Payer: Medicare Other | Admitting: Family

## 2014-03-08 ENCOUNTER — Encounter: Payer: Self-pay | Admitting: Family Medicine

## 2014-03-08 VITALS — BP 133/93 | HR 67 | Temp 98.0°F | Ht 64.0 in | Wt 258.4 lb

## 2014-03-08 DIAGNOSIS — I10 Essential (primary) hypertension: Secondary | ICD-10-CM

## 2014-03-08 DIAGNOSIS — I5022 Chronic systolic (congestive) heart failure: Secondary | ICD-10-CM | POA: Diagnosis not present

## 2014-03-08 DIAGNOSIS — I2589 Other forms of chronic ischemic heart disease: Secondary | ICD-10-CM | POA: Diagnosis not present

## 2014-03-08 DIAGNOSIS — E039 Hypothyroidism, unspecified: Secondary | ICD-10-CM

## 2014-03-08 DIAGNOSIS — D649 Anemia, unspecified: Secondary | ICD-10-CM

## 2014-03-08 DIAGNOSIS — N289 Disorder of kidney and ureter, unspecified: Secondary | ICD-10-CM

## 2014-03-08 DIAGNOSIS — E11319 Type 2 diabetes mellitus with unspecified diabetic retinopathy without macular edema: Secondary | ICD-10-CM | POA: Diagnosis not present

## 2014-03-08 DIAGNOSIS — E785 Hyperlipidemia, unspecified: Secondary | ICD-10-CM | POA: Diagnosis not present

## 2014-03-08 LAB — RENAL FUNCTION PANEL
Albumin: 3.5 g/dL (ref 3.5–5.2)
BUN: 40 mg/dL — ABNORMAL HIGH (ref 6–23)
CO2: 19 meq/L (ref 19–32)
CREATININE: 1.6 mg/dL — AB (ref 0.4–1.2)
Calcium: 9.2 mg/dL (ref 8.4–10.5)
Chloride: 106 mEq/L (ref 96–112)
GFR: 34.73 mL/min — AB (ref >60.00)
Glucose, Bld: 122 mg/dL — ABNORMAL HIGH (ref 70–99)
POTASSIUM: 5.2 meq/L — AB (ref 3.5–5.1)
Phosphorus: 3.7 mg/dL (ref 2.3–4.6)
SODIUM: 141 meq/L (ref 135–145)

## 2014-03-08 LAB — CBC
HCT: 39.2 % (ref 36.0–46.0)
Hemoglobin: 12.8 g/dL (ref 12.0–15.0)
MCHC: 32.6 g/dL (ref 30.0–36.0)
MCV: 89.4 fl (ref 78.0–100.0)
Platelets: 177 10*3/uL (ref 150.0–400.0)
RBC: 4.38 Mil/uL (ref 3.87–5.11)
RDW: 14.3 % (ref 11.5–15.5)
WBC: 8.4 10*3/uL (ref 4.0–10.5)

## 2014-03-08 LAB — HEPATIC FUNCTION PANEL
ALT: 13 U/L (ref 0–35)
AST: 17 U/L (ref 0–37)
Albumin: 3.5 g/dL (ref 3.5–5.2)
Alkaline Phosphatase: 84 U/L (ref 39–117)
Bilirubin, Direct: 0 mg/dL (ref 0.0–0.3)
Total Bilirubin: 0.4 mg/dL (ref 0.2–1.2)
Total Protein: 7.6 g/dL (ref 6.0–8.3)

## 2014-03-08 LAB — LIPID PANEL
CHOLESTEROL: 183 mg/dL (ref 0–200)
HDL: 33.3 mg/dL — ABNORMAL LOW (ref >39.00)
NonHDL: 149.7
TRIGLYCERIDES: 425 mg/dL — AB (ref 0.0–149.0)
Total CHOL/HDL Ratio: 5
VLDL: 85 mg/dL — AB (ref 0.0–40.0)

## 2014-03-08 LAB — LDL CHOLESTEROL, DIRECT: LDL DIRECT: 71.1 mg/dL

## 2014-03-08 NOTE — Progress Notes (Signed)
Pre visit review using our clinic review tool, if applicable. No additional management support is needed unless otherwise documented below in the visit note. 

## 2014-03-09 LAB — TSH: TSH: 0.64 u[IU]/mL (ref 0.35–4.50)

## 2014-03-10 ENCOUNTER — Encounter (HOSPITAL_COMMUNITY): Payer: Self-pay

## 2014-03-10 ENCOUNTER — Ambulatory Visit (HOSPITAL_COMMUNITY)
Admission: RE | Admit: 2014-03-10 | Discharge: 2014-03-10 | Disposition: A | Discharge status: Home / Self Care | Payer: Medicare Other | Source: Ambulatory Visit | Attending: Cardiology | Admitting: Cardiology

## 2014-03-10 ENCOUNTER — Encounter: Payer: Self-pay | Admitting: Internal Medicine

## 2014-03-10 VITALS — BP 134/52 | HR 72 | Wt 257.4 lb

## 2014-03-10 DIAGNOSIS — E119 Type 2 diabetes mellitus without complications: Secondary | ICD-10-CM | POA: Diagnosis not present

## 2014-03-10 DIAGNOSIS — I5022 Chronic systolic (congestive) heart failure: Secondary | ICD-10-CM | POA: Diagnosis not present

## 2014-03-10 DIAGNOSIS — Z7901 Long term (current) use of anticoagulants: Secondary | ICD-10-CM | POA: Diagnosis not present

## 2014-03-10 DIAGNOSIS — Z8572 Personal history of non-Hodgkin lymphomas: Secondary | ICD-10-CM | POA: Insufficient documentation

## 2014-03-10 DIAGNOSIS — I6529 Occlusion and stenosis of unspecified carotid artery: Secondary | ICD-10-CM | POA: Insufficient documentation

## 2014-03-10 DIAGNOSIS — I10 Essential (primary) hypertension: Secondary | ICD-10-CM

## 2014-03-10 DIAGNOSIS — Z951 Presence of aortocoronary bypass graft: Secondary | ICD-10-CM | POA: Insufficient documentation

## 2014-03-10 DIAGNOSIS — Z7982 Long term (current) use of aspirin: Secondary | ICD-10-CM | POA: Diagnosis not present

## 2014-03-10 DIAGNOSIS — E039 Hypothyroidism, unspecified: Secondary | ICD-10-CM | POA: Insufficient documentation

## 2014-03-10 DIAGNOSIS — I4891 Unspecified atrial fibrillation: Secondary | ICD-10-CM | POA: Diagnosis not present

## 2014-03-10 DIAGNOSIS — I2510 Atherosclerotic heart disease of native coronary artery without angina pectoris: Secondary | ICD-10-CM | POA: Diagnosis not present

## 2014-03-10 DIAGNOSIS — G4733 Obstructive sleep apnea (adult) (pediatric): Secondary | ICD-10-CM | POA: Insufficient documentation

## 2014-03-10 DIAGNOSIS — Z79899 Other long term (current) drug therapy: Secondary | ICD-10-CM | POA: Insufficient documentation

## 2014-03-10 DIAGNOSIS — Z9981 Dependence on supplemental oxygen: Secondary | ICD-10-CM | POA: Diagnosis not present

## 2014-03-10 DIAGNOSIS — I129 Hypertensive chronic kidney disease with stage 1 through stage 4 chronic kidney disease, or unspecified chronic kidney disease: Secondary | ICD-10-CM | POA: Diagnosis not present

## 2014-03-10 DIAGNOSIS — I5042 Chronic combined systolic (congestive) and diastolic (congestive) heart failure: Secondary | ICD-10-CM | POA: Diagnosis present

## 2014-03-10 DIAGNOSIS — Z794 Long term (current) use of insulin: Secondary | ICD-10-CM | POA: Insufficient documentation

## 2014-03-10 DIAGNOSIS — E785 Hyperlipidemia, unspecified: Secondary | ICD-10-CM | POA: Insufficient documentation

## 2014-03-10 DIAGNOSIS — N183 Chronic kidney disease, stage 3 unspecified: Secondary | ICD-10-CM

## 2014-03-10 DIAGNOSIS — I480 Paroxysmal atrial fibrillation: Secondary | ICD-10-CM | POA: Diagnosis not present

## 2014-03-10 MED ORDER — ATORVASTATIN CALCIUM 20 MG PO TABS: 20.0000 mg | ORAL_TABLET | Freq: Every day | ORAL | Status: DC

## 2014-03-10 MED ORDER — SIMVASTATIN 40 MG PO TABS: 40.0000 mg | ORAL_TABLET | Freq: Every day | ORAL | Status: DC

## 2014-03-10 MED ORDER — FENOFIBRATE 48 MG PO TABS: 48.0000 mg | ORAL_TABLET | Freq: Every day | ORAL | Status: DC

## 2014-03-10 NOTE — Patient Instructions (Signed)
Stop Simvastatin  Start Atorvastatin 20 mg daily  Start Fenofibrate 48 mg daily  Fasting Labs in 2 monhts (lipids)  Your physician recommends that you schedule a follow-up appointment in: 3 months

## 2014-03-10 NOTE — Progress Notes (Signed)
Patient ID: BRIELLE MORO, female   DOB: 01/15/1941, 73 y.o.   MRN: [id number] PCP: Dr Charlett Blake Nephrologist: Dr Marval Regal EP: Dr Lovena Le  Pulmonary: Dr Gwenette Greet  HPI: LINDAMARIE MACLACHLAN is a 73 y.o. female with a history of CAD, status post CABG in 2003, ischemic cardiomyopathy with variable ejection fractions noted in the past (echo in 12/2011 demonstrated normal LV function with an EF 60-65%), status post CRT-D, chronic combined systolic and diastolic CHF, paroxysmal atrial fibrillation, prior LV mural thrombus, on chronic Coumadin therapy, CKD, T2DM. She has a prior admission to the hospital for acute renal failure in the setting of rhabdomyolysis (while taking colchicine and fenofibrate). Carotid US (12/14): Bilateral ICA stenosis 40-59%.    She was admitted 10/23-11/11/14 with acute on chronic combined systolic and diastolic CHF. ECHO with worsening LV function with an EF of 35-40%. Hospital stay was complicated by a/c renal failure and respiratory failure. Had short term intubation. She was made a DNR/DNI.  Discharge weight 256 lbs. She is on home oxygen 2L by nasal cannula and CPAP at night, OHS/OSA.   She returns for follow up.  She is quite limited.  She uses a motorized scooter outside the house and a walker in the house.  Balance is poor and she has a lot of hip pain.  She has dyspnea with housework. She can walk around her house without dyspnea.  Weight has been very stable on home scale.  No chest pain.    I checked Corevue on her CRT-D device today.  This showed stable thoracic impedance without evidence for volume overload.  ECG: A-V sequential pacing  ECHO (03/06/13): EF 35-40% ECHO (09/07/13): EF 40%, grade II diastolic dysfunction, mildly decreased RV systolic function with normal RV size.   Labs  05/01/13 K 5.4 Creatinine 1.46 Pro BNP 646  05/19/13 K 5.0 Creatinine 1.38 3/15 LDL 41, HDL 38, creatinine 1.7 4/15 K 5.2, creatinine 1.8 09/16/13 K 4.6 Creatinine 1.6 10/15 K 5.2, creatinine  1.6, LDL 71, HDL 33, TGs 425, TSH normal  ROS: All systems negative except as listed in HPI, PMH and Problem List.  Past Medical History  Diagnosis Date  . LBBB (left bundle branch block)   . Carotid stenosis     a. 10/19/2011 carotid duplex - Mild hard plaque bilaterally. Stable 40-59% bilateral ICA stenosis. Carotid US (04/2013):  Bilateral 40-59% ICA.  F/u 1 year  . Dyslipidemia   . Hypertension   . Chronic combined systolic and diastolic CHF (congestive heart failure)     a. EF as low as 25% in 2006;  b. EF 60-65% in 12/2011;  c. 02/2013 Echo: EF 35-40, mod mid-dist antsept HK, Gr 2 DD, mild LVH.  . Back pain, chronic     "just when I walk; mass on 3rd and 4th vertebrae right lower back"  . Cardiomyopathy, ischemic     a. 2012 s/p SJM 3231-40 Uni BiV ICD, ser # [id number].  Marland Kitchen CAD (coronary artery disease)     a. 05/2001 CABG x4: LIMA->LAD, VG->D1, VG->D2, VG->RCA.  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 CKD (chronic kidney disease), stage III   . Biventricular implantable cardiac defibrillator in situ 2007, 2012    a. 2007;  b. 2012 Gen change: SJM 3231-40 Uni BiV ICD, ser # [id number].  Marland Kitchen Hypoxemia requiring supplemental oxygen   . Candidiasis of vulva and vagina   . Hypothyroidism   . 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 Benay Spice, in remission  . History of pneumonia 12/27/11    "3 times; it's been a long time ago"  . OSA on CPAP   . Gout ~ 08/2011  . PAF (paroxysmal atrial fibrillation)     on Coumadin  . DM (diabetes mellitus), type 2 with complications     insulin dependent, retinopathy, neuropathy  . Mural thrombus of left ventricle     before 2003, while on Coumadin, No h/o CVA  . Morbid obesity with BMI of 45.0-49.9, adult     Ht. 5'4". BMI 47.2  . Vitamin D deficiency 06/09/2012    historical  . Olecranon bursitis of right elbow 12/29/2012  . History of recurrent UTIs   . Diabetes 05/24/2013  .  Hypercalcemia 09/26/2013  . Anemia, unspecified 12/13/2013    Current Outpatient Prescriptions  Medication Sig Dispense Refill  . albuterol (PROVENTIL HFA;VENTOLIN HFA) 108 (90 BASE) MCG/ACT inhaler Inhale 2 puffs into the lungs every 6 (six) hours as needed for wheezing or shortness of breath.  1 Inhaler  1  . amLODipine (NORVASC) 5 MG tablet Take 1 tablet (5 mg total) by mouth daily.  90 tablet  0  . carvedilol (COREG) 25 MG tablet Take 1 tablet (25 mg total) by mouth 2 (two) times daily with a meal.  60 tablet  6  . enalapril (VASOTEC) 2.5 MG tablet Take 1 tablet (2.5 mg total) by mouth 2 (two) times daily.  60 tablet  6  . escitalopram (LEXAPRO) 10 MG tablet Take 1 tablet (10 mg total) by mouth daily.  90 tablet  0  . fluticasone (FLONASE) 50 MCG/ACT nasal spray Place 2 sprays into both nostrils daily as needed for allergies or rhinitis. As needed for nasal stuffiness.  16 g  0  . gabapentin (NEURONTIN) 300 MG capsule Take 1 capsule (300 mg total) by mouth 2 (two) times daily.  180 capsule  0  . glucose blood (FREESTYLE LITE) test strip DX: 250.60  Check sugars bid and as needed  100 each  2  . insulin NPH (HUMULIN N,NOVOLIN N) 100 UNIT/ML injection Inject 25-44 Units into the skin 2 (two) times daily before a meal. Take 44 units in the morning and 25 units in the evening      . insulin regular (NOVOLIN R,HUMULIN R) 100 units/mL injection Change to 15 units three times a with meals (to cover all meals--this is the same total amount but spread out)  10 mL  12  . levothyroxine (SYNTHROID, LEVOTHROID) 25 MCG tablet Take 1 tablet (25 mcg total) by mouth daily before breakfast.  90 tablet  0  . LORazepam (ATIVAN) 0.5 MG tablet TAKE ONE TABLET BY MOUTH TWICE DAILY AS NEEDED FOR ANXIETY OR SLEEP  45 tablet  0  . NON FORMULARY Oxygen---24 hour      . torsemide (DEMADEX) 20 MG tablet Take 1 tab po daily and may take an extra tab for 3# weight gain, swelling or SOB  135 tablet  3  . traMADol (ULTRAM) 50  MG tablet Take 1 tablet (50 mg total) by mouth every 12 (twelve) hours as needed.  60 tablet  1  . Vitamin D, Ergocalciferol, (DRISDOL) 50000 UNITS CAPS capsule Take 50,000 Units by mouth every 7 (seven) days.      Marland Kitchen warfarin (COUMADIN) 5 MG tablet Take as directed by Anticoagulation clinic  45 tablet  3  . atorvastatin (LIPITOR) 20 MG tablet Take 1 tablet (  20 mg total) by mouth daily at 6 PM.  90 tablet  3  . fenofibrate (TRICOR) 48 MG tablet Take 1 tablet (48 mg total) by mouth daily.  90 tablet  3   No current facility-administered medications for this encounter.    Filed Vitals:   03/10/14 1037  BP: 134/52  Pulse: 72  Weight: 257 lb 6.4 oz (116.756 kg)  SpO2: 87%    PHYSICAL EXAM: General:  Obese. No resp difficulty; on 2 L continuous. Husband present  HEENT: normal Neck: supple. JVP difficult to assess due to thick neck but does not appear elevated. Carotids 2+ bilaterally; no bruits. No lymphadenopathy or thryomegaly appreciated. Cor: PMI normal. Regular rate & rhythm. No rubs, gallops.  2/6 early SEM RUSB. Lungs: clear Abdomen: Obese, soft, nontender, obese, nondistended. No hepatosplenomegaly. No bruits or masses. Good bowel sounds. Extremities: no cyanosis, clubbing, rash, no edema Neuro: alert & orientedx3, cranial nerves grossly intact. Moves all 4 extremities w/o difficulty. Affect pleasant.  ASSESSMENT & PLAN:  1) Chronic systolic HF: Ischemic cardiomyopathy s/p CRT-D ([employer]). Echo 08/2013: EF is about 40%.  NYHA II-III symptoms but seems more limited by hip and low back. Volume status stable, weight stable on home scale. Corevue confirms stable volume.  - Will continue torsemide 20 mg daily with an extra 10 mg as needed for 3 pound weight gain.  - Continue current Coreg.  - Continue enalapril to 2.5 mg bid.  No uptitration. Has had low BP at home and dizziness.  - No spironolactone for now given mildly elevated K on recent BMET => follow low K diet.   - Reinforced  the need and importance of daily weights, a low sodium diet, and fluid restriction (less than 2 L a day). Instructed to call the HF clinic if weight increases more than 3 lbs overnight or 5 lbs in a week. 2) PAF: She is not in atrial fibrillation currently.  Continue BB and Coumadin. No bleeding problems. 3) OHS/OSA: Continue nightly CPAP and home oxygen.  4) CAD: s/p CABG.  No chest pain.  Continue statin.  She is not on ASA given comadin use and stable CAD.  5) Hyperlipidemia: Patient is on simvastatin 40 mg daily and amlodipine.  This combination is high risk for side effects from the statin.  I will have her stop simvastatin and start atorvastatin 20 mg daily.  Given very high triglycerides, she will also start fenofibrate 48 mg daily.  6) Carotid stenosis: Due for carotid dopplers.   Follow up in 3 months.   Loralie Champagne  03/10/2014

## 2014-03-14 ENCOUNTER — Encounter: Payer: Self-pay | Admitting: Family Medicine

## 2014-03-14 DIAGNOSIS — N289 Disorder of kidney and ureter, unspecified: Secondary | ICD-10-CM | POA: Insufficient documentation

## 2014-03-14 NOTE — Assessment & Plan Note (Signed)
No recent exacerbations.  

## 2014-03-14 NOTE — Assessment & Plan Note (Signed)
Tolerating statin, encouraged heart healthy diet, avoid trans fats, minimize simple carbs and saturated fats. Increase exercise as tolerated 

## 2014-03-14 NOTE — Progress Notes (Signed)
Destiny [name] [id number] [birth year] 03/14/2014      Progress Note-Follow Up  Subjective  Chief Complaint  Chief Complaint  Patient presents with  . Follow-up    3 month    HPI  Patient is a 73 year old female in today for routine medical care. She is doing much better, had a right leg cellulitis but that has resolved. No fevers, chills. Sugars improving, this am 103, A1C 6.3 with her endocrinologist. Took her flu shot on Sept 1 at [facility] in [place]. Has been managing her edema well with intermittent diuretic use and diet. Denies CP/palp/SOB/HA/congestion/fevers/GI or GU c/o. Taking meds as prescribed  Past Medical History  Diagnosis Date  . LBBB (left bundle branch block)   . Carotid stenosis     a. 10/19/2011 carotid duplex - Mild hard plaque bilaterally. Stable 40-59% bilateral ICA stenosis. Carotid US (04/2013):  Bilateral 40-59% ICA.  F/u 1 year  . Dyslipidemia   . Hypertension   . Chronic combined systolic and diastolic CHF (congestive heart failure)     a. EF as low as 25% in 2006;  b. EF 60-65% in 12/2011;  c. 02/2013 Echo: EF 35-40, mod mid-dist antsept HK, Gr 2 DD, mild LVH.  . Back pain, chronic     "just when I walk; mass on 3rd and 4th vertebrae right lower back"  . Cardiomyopathy, ischemic     a. 2012 s/p SJM 3231-40 Uni BiV ICD, ser # [id number].  Marland Kitchen CAD (coronary artery disease)     a. 05/2001 CABG x4: LIMA->LAD, VG->D1, VG->D2, VG->RCA.  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 CKD (chronic kidney disease), stage III   . Biventricular implantable cardiac defibrillator in situ 2007, 2012    a. 2007;  b. 2012 Gen change: SJM 3231-40 Uni BiV ICD, ser # [id number].  Marland Kitchen Hypoxemia requiring supplemental oxygen   . Candidiasis of vulva and vagina   . Hypothyroidism   . 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 Benay Spice, in remission  . History of pneumonia 12/27/11    "3 times; it's been a long time ago"  . OSA on  CPAP   . Gout ~ 08/2011  . PAF (paroxysmal atrial fibrillation)     on Coumadin  . DM (diabetes mellitus), type 2 with complications     insulin dependent, retinopathy, neuropathy  . Mural thrombus of left ventricle     before 2003, while on Coumadin, No h/o CVA  . Morbid obesity with BMI of 45.0-49.9, adult     Ht. 5'4". BMI 47.2  . Vitamin D deficiency 06/09/2012    historical  . Olecranon bursitis of right elbow 12/29/2012  . History of recurrent UTIs   . Diabetes 05/24/2013  . Hypercalcemia 09/26/2013  . Anemia, unspecified 12/13/2013    Past Surgical History  Procedure Laterality Date  . Cardiac catheterization  06/03/01  . Cholecystectomy  1970  . Tubal ligation  1972  . Abdominal hysterectomy  1982  . Insert / replace / remove pacemaker  2007; 2012    w/AICD  . Coronary artery bypass graft  2003    CABG X4  . Cataract extraction      left eye    Family History  Problem Relation Age of Onset  . Kidney cancer Mother     kidney and female repo - died @ 65  . Heart disease Father     died @  70  . Asthma Mother   . Stroke Father     History   Social History  . Marital Status: Married    Spouse Name: N/A    Number of Children: Y  . Years of Education: N/A   Occupational History  . 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    Social History Main Topics  . Smoking status: Never Smoker   . Smokeless tobacco: Never Used  . Alcohol Use: No  . Drug Use: No  . Sexual Activity: Not Currently   Other Topics Concern  . Not on file   Social History Narrative   Lives in [place] with her husband.  She does not routinely exercise or adhere to any particular diet.      Current Outpatient Prescriptions on File Prior to Visit  Medication Sig Dispense Refill  . albuterol (PROVENTIL HFA;VENTOLIN HFA) 108 (90 BASE) MCG/ACT inhaler Inhale 2 puffs into the lungs every 6 (six) hours as needed for wheezing or shortness of breath. 1 Inhaler 1  . amLODipine (NORVASC) 5 MG tablet  Take 1 tablet (5 mg total) by mouth daily. 90 tablet 0  . carvedilol (COREG) 25 MG tablet Take 1 tablet (25 mg total) by mouth 2 (two) times daily with a meal. 60 tablet 6  . enalapril (VASOTEC) 2.5 MG tablet Take 1 tablet (2.5 mg total) by mouth 2 (two) times daily. 60 tablet 6  . escitalopram (LEXAPRO) 10 MG tablet Take 1 tablet (10 mg total) by mouth daily. 90 tablet 0  . fluticasone (FLONASE) 50 MCG/ACT nasal spray Place 2 sprays into both nostrils daily as needed for allergies or rhinitis. As needed for nasal stuffiness. 16 g 0  . gabapentin (NEURONTIN) 300 MG capsule Take 1 capsule (300 mg total) by mouth 2 (two) times daily. 180 capsule 0  . glucose blood (FREESTYLE LITE) test strip DX: 250.60  Check sugars bid and as needed 100 each 2  . insulin NPH (HUMULIN N,NOVOLIN N) 100 UNIT/ML injection Inject 25-44 Units into the skin 2 (two) times daily before a meal. Take 44 units in the morning and 25 units in the evening    . insulin regular (NOVOLIN R,HUMULIN R) 100 units/mL injection Change to 15 units three times a with meals (to cover all meals--this is the same total amount but spread out) 10 mL 12  . levothyroxine (SYNTHROID, LEVOTHROID) 25 MCG tablet Take 1 tablet (25 mcg total) by mouth daily before breakfast. 90 tablet 0  . LORazepam (ATIVAN) 0.5 MG tablet TAKE ONE TABLET BY MOUTH TWICE DAILY AS NEEDED FOR ANXIETY OR SLEEP 45 tablet 0  . NON FORMULARY Oxygen---24 hour    . torsemide (DEMADEX) 20 MG tablet Take 1 tab po daily and may take an extra tab for 3# weight gain, swelling or SOB 135 tablet 3  . traMADol (ULTRAM) 50 MG tablet Take 1 tablet (50 mg total) by mouth every 12 (twelve) hours as needed. 60 tablet 1  . Vitamin D, Ergocalciferol, (DRISDOL) 50000 UNITS CAPS capsule Take 50,000 Units by mouth every 7 (seven) days.    Marland Kitchen warfarin (COUMADIN) 5 MG tablet Take as directed by Anticoagulation clinic 45 tablet 3   No current facility-administered medications on file prior to visit.     Allergies  Allergen Reactions  . Sulfonamide Derivatives Other (See Comments)    Unknown; "childhood allergy/mother"    Review of Systems  Review of Systems  Constitutional: Positive for malaise/fatigue. Negative for  fever.  HENT: Negative for congestion.   Eyes: Negative for discharge.  Respiratory: Positive for sputum production. Negative for shortness of breath.   Cardiovascular: Negative for chest pain, palpitations and leg swelling.  Gastrointestinal: Negative for nausea, abdominal pain and diarrhea.  Genitourinary: Negative for dysuria.  Musculoskeletal: Negative for falls.  Skin: Negative for rash.  Neurological: Negative for loss of consciousness and headaches.  Endo/Heme/Allergies: Negative for polydipsia.  Psychiatric/Behavioral: Negative for depression and suicidal ideas. The patient is not nervous/anxious and does not have insomnia.     Objective  BP 133/93 mmHg  Pulse 67  Temp(Src) 98 F (36.7 C) (Oral)  Ht 5\' 4"  (1.626 m)  Wt 258 lb 6.4 oz (117.209 kg)  BMI 44.33 kg/m2  SpO2 93%  Physical Exam  Physical Exam  Constitutional: She is oriented to person, place, and time and well-developed, well-nourished, and in no distress. No distress.  HENT:  Head: Normocephalic and atraumatic.  Eyes: Conjunctivae are normal.  Neck: Neck supple. No thyromegaly present.  Cardiovascular: Normal rate, regular rhythm and normal heart sounds.   No murmur heard. Pulmonary/Chest: Effort normal and breath sounds normal. She has no wheezes.  Abdominal: She exhibits no distension and no mass.  Musculoskeletal: She exhibits no edema.  Lymphadenopathy:    She has no cervical adenopathy.  Neurological: She is alert and oriented to person, place, and time.  Skin: Skin is warm and dry. No rash noted. She is not diaphoretic.  Psychiatric: Memory, affect and judgment normal.    Lab Results  Component Value Date   TSH 0.64 03/08/2014   Lab Results  Component Value Date    WBC 8.4 03/08/2014   HGB 12.8 03/08/2014   HCT 39.2 03/08/2014   MCV 89.4 03/08/2014   PLT 177.0 03/08/2014   Lab Results  Component Value Date   CREATININE 1.6* 03/08/2014   BUN 40* 03/08/2014   NA 141 03/08/2014   K 5.2* 03/08/2014   CL 106 03/08/2014   CO2 19 03/08/2014   Lab Results  Component Value Date   ALT 13 03/08/2014   AST 17 03/08/2014   ALKPHOS 84 03/08/2014   BILITOT 0.4 03/08/2014   Lab Results  Component Value Date   CHOL 183 03/08/2014   Lab Results  Component Value Date   HDL 33.30* 03/08/2014   Lab Results  Component Value Date   LDLCALC 41 07/27/2013   Lab Results  Component Value Date   TRIG 425.0* 03/08/2014   Lab Results  Component Value Date   CHOLHDL 5 03/08/2014     Assessment & Plan  Diabetes mellitus type 2 with retinopathy Sugars improving, checks A1C with endocrinology, Dr Chalmers Cater Minimize simple carbs. Continue current meds  Essential hypertension Well controlled, no changes to meds. Encouraged heart healthy diet such as the DASH diet and exercise as tolerated.   Hyperlipidemia Tolerating statin, encouraged heart healthy diet, avoid trans fats, minimize simple carbs and saturated fats. Increase exercise as tolerated  Hypothyroidism Encouraged complete cessation. Discussed need to quit as relates to risk of numerous cancers, cardiac and pulmonary disease as well as neurologic complications. Counseled for greater than 3 minutes  Anemia Increase leafy greens, consider increased lean red meat and using cast iron cookware. Continue to monitor, report any concerns  Chronic systolic heart failure No recent exacerbations.

## 2014-03-14 NOTE — Assessment & Plan Note (Addendum)
Sugars improving, checks A1C with endocrinology, Dr Chalmers Cater Minimize simple carbs. Continue current meds

## 2014-03-14 NOTE — Assessment & Plan Note (Signed)
Well controlled, no changes to meds. Encouraged heart healthy diet such as the DASH diet and exercise as tolerated.  °

## 2014-03-14 NOTE — Assessment & Plan Note (Signed)
Encouraged complete cessation. Discussed need to quit as relates to risk of numerous cancers, cardiac and pulmonary disease as well as neurologic complications. Counseled for greater than 3 minutes 

## 2014-03-14 NOTE — Assessment & Plan Note (Signed)
Increase leafy greens, consider increased lean red meat and using cast iron cookware. Continue to monitor, report any concerns 

## 2014-03-15 ENCOUNTER — Other Ambulatory Visit (HOSPITAL_COMMUNITY): Payer: Self-pay | Admitting: Cardiology

## 2014-03-15 ENCOUNTER — Telehealth (HOSPITAL_COMMUNITY): Payer: Self-pay | Admitting: Vascular Surgery

## 2014-03-15 DIAGNOSIS — E785 Hyperlipidemia, unspecified: Secondary | ICD-10-CM

## 2014-03-15 MED ORDER — FENOFIBRATE 48 MG PO TABS: 48.0000 mg | ORAL_TABLET | Freq: Every day | ORAL | Status: DC

## 2014-03-15 MED ORDER — ATORVASTATIN CALCIUM 20 MG PO TABS: 20.0000 mg | ORAL_TABLET | Freq: Every day | ORAL | Status: DC

## 2014-03-15 NOTE — Telephone Encounter (Signed)
Pt called requesting rx be sent into local pharmacy rx's sent into [facility] [address]

## 2014-03-15 NOTE — Telephone Encounter (Signed)
Opened in error

## 2014-03-15 NOTE — Telephone Encounter (Signed)
Pt called she was her on 10/28 pt was suppose top start new medication but was not called in to [facility] ground Atorvastin 20 mg/ Fenofbrate 48 mg.. Please advise

## 2014-03-16 ENCOUNTER — Ambulatory Visit (INDEPENDENT_AMBULATORY_CARE_PROVIDER_SITE_OTHER): Payer: Medicare Other | Admitting: Surgery

## 2014-03-16 ENCOUNTER — Other Ambulatory Visit (HOSPITAL_COMMUNITY): Payer: Self-pay | Admitting: Cardiology

## 2014-03-16 ENCOUNTER — Telehealth: Payer: Self-pay | Admitting: Family Medicine

## 2014-03-16 DIAGNOSIS — I482 Chronic atrial fibrillation, unspecified: Secondary | ICD-10-CM

## 2014-03-16 DIAGNOSIS — E785 Hyperlipidemia, unspecified: Secondary | ICD-10-CM

## 2014-03-16 DIAGNOSIS — I4891 Unspecified atrial fibrillation: Secondary | ICD-10-CM

## 2014-03-16 DIAGNOSIS — I639 Cerebral infarction, unspecified: Secondary | ICD-10-CM | POA: Diagnosis not present

## 2014-03-16 DIAGNOSIS — Z5181 Encounter for therapeutic drug level monitoring: Secondary | ICD-10-CM | POA: Diagnosis not present

## 2014-03-16 DIAGNOSIS — I6350 Cerebral infarction due to unspecified occlusion or stenosis of unspecified cerebral artery: Secondary | ICD-10-CM

## 2014-03-16 LAB — POCT INR: INR: 3.3

## 2014-03-16 MED ORDER — FENOFIBRATE 48 MG PO TABS: 48.0000 mg | ORAL_TABLET | Freq: Every day | ORAL | Status: DC

## 2014-03-16 MED ORDER — ATORVASTATIN CALCIUM 20 MG PO TABS: 20.0000 mg | ORAL_TABLET | Freq: Every day | ORAL | Status: DC

## 2014-03-16 NOTE — Telephone Encounter (Signed)
Pt states dr. Charlett Blake informed her that she would send [facility] a copy of her blood work, pt states they have not received it yet and is requesting her to come back in to get more and pt states she does not want to be charged again for blood work because she has already had the blood work done. Please send a copy to [facility] pt states she needs it faxed by Thursday before her appt.

## 2014-03-16 NOTE — Telephone Encounter (Signed)
Please fax her lab results over to [facility] ASAP. Thanks.

## 2014-03-17 ENCOUNTER — Other Ambulatory Visit: Payer: Self-pay

## 2014-03-17 NOTE — Telephone Encounter (Signed)
Lab results from 03/08/14 faxed to [facility] at [phone].  Fax confirmation received.

## 2014-03-18 DIAGNOSIS — N183 Chronic kidney disease, stage 3 (moderate): Secondary | ICD-10-CM | POA: Diagnosis not present

## 2014-03-18 DIAGNOSIS — I129 Hypertensive chronic kidney disease with stage 1 through stage 4 chronic kidney disease, or unspecified chronic kidney disease: Secondary | ICD-10-CM | POA: Diagnosis not present

## 2014-03-18 DIAGNOSIS — N2581 Secondary hyperparathyroidism of renal origin: Secondary | ICD-10-CM | POA: Diagnosis not present

## 2014-03-18 DIAGNOSIS — D638 Anemia in other chronic diseases classified elsewhere: Secondary | ICD-10-CM | POA: Diagnosis not present

## 2014-03-30 ENCOUNTER — Ambulatory Visit (INDEPENDENT_AMBULATORY_CARE_PROVIDER_SITE_OTHER): Payer: Medicare Other

## 2014-03-30 DIAGNOSIS — I6350 Cerebral infarction due to unspecified occlusion or stenosis of unspecified cerebral artery: Secondary | ICD-10-CM

## 2014-03-30 DIAGNOSIS — I482 Chronic atrial fibrillation, unspecified: Secondary | ICD-10-CM

## 2014-03-30 DIAGNOSIS — Z5181 Encounter for therapeutic drug level monitoring: Secondary | ICD-10-CM

## 2014-03-30 DIAGNOSIS — I4891 Unspecified atrial fibrillation: Secondary | ICD-10-CM

## 2014-03-30 DIAGNOSIS — I639 Cerebral infarction, unspecified: Secondary | ICD-10-CM | POA: Diagnosis not present

## 2014-03-30 LAB — POCT INR: INR: 2.7

## 2014-04-05 ENCOUNTER — Ambulatory Visit (INDEPENDENT_AMBULATORY_CARE_PROVIDER_SITE_OTHER): Payer: Medicare Other | Admitting: *Deleted

## 2014-04-05 ENCOUNTER — Other Ambulatory Visit: Payer: Self-pay | Admitting: Family Medicine

## 2014-04-05 DIAGNOSIS — I5022 Chronic systolic (congestive) heart failure: Secondary | ICD-10-CM

## 2014-04-05 DIAGNOSIS — I255 Ischemic cardiomyopathy: Secondary | ICD-10-CM

## 2014-04-05 NOTE — Progress Notes (Signed)
Remote ICD transmission.   

## 2014-04-05 NOTE — Telephone Encounter (Signed)
rx printed for md to sign and fax 

## 2014-04-07 LAB — MDC_IDC_ENUM_SESS_TYPE_REMOTE
Battery Remaining Percentage: 47 %
Brady Statistic RV Percent Paced: 99 %
HighPow Impedance: 48 Ohm
Implantable Pulse Generator Serial Number: 631685
Lead Channel Impedance Value: 280 Ohm
Lead Channel Impedance Value: 310 Ohm
Lead Channel Pacing Threshold Pulse Width: 0.6 ms
Lead Channel Sensing Intrinsic Amplitude: 2.4 mV
Lead Channel Setting Pacing Amplitude: 2.375
Lead Channel Setting Pacing Amplitude: 2.5 V
Lead Channel Setting Pacing Pulse Width: 0.5 ms
Lead Channel Setting Pacing Pulse Width: 0.6 ms
Lead Channel Setting Sensing Sensitivity: 0.5 mV
MDC IDC MSMT LEADCHNL LV PACING THRESHOLD AMPLITUDE: 1.25 V
MDC IDC MSMT LEADCHNL RV IMPEDANCE VALUE: 390 Ohm
MDC IDC SET LEADCHNL RA PACING AMPLITUDE: 1.75 V
MDC IDC SET ZONE DETECTION INTERVAL: 345 ms
Zone Setting Detection Interval: 250 ms
Zone Setting Detection Interval: 310 ms

## 2014-04-20 ENCOUNTER — Ambulatory Visit (INDEPENDENT_AMBULATORY_CARE_PROVIDER_SITE_OTHER): Payer: Medicare Other | Admitting: Pharmacist

## 2014-04-20 DIAGNOSIS — Z5181 Encounter for therapeutic drug level monitoring: Secondary | ICD-10-CM

## 2014-04-20 DIAGNOSIS — I6350 Cerebral infarction due to unspecified occlusion or stenosis of unspecified cerebral artery: Secondary | ICD-10-CM

## 2014-04-20 DIAGNOSIS — I482 Chronic atrial fibrillation, unspecified: Secondary | ICD-10-CM

## 2014-04-20 DIAGNOSIS — I4891 Unspecified atrial fibrillation: Secondary | ICD-10-CM

## 2014-04-20 DIAGNOSIS — I639 Cerebral infarction, unspecified: Secondary | ICD-10-CM | POA: Diagnosis not present

## 2014-04-20 LAB — POCT INR: INR: 1.9

## 2014-04-24 ENCOUNTER — Other Ambulatory Visit (HOSPITAL_COMMUNITY): Payer: Self-pay | Admitting: Anesthesiology

## 2014-04-24 ENCOUNTER — Other Ambulatory Visit: Payer: Self-pay | Admitting: Family Medicine

## 2014-04-28 ENCOUNTER — Encounter: Payer: Self-pay | Admitting: Cardiology

## 2014-04-28 ENCOUNTER — Other Ambulatory Visit (HOSPITAL_COMMUNITY): Payer: Self-pay | Admitting: *Deleted

## 2014-04-28 DIAGNOSIS — I6523 Occlusion and stenosis of bilateral carotid arteries: Secondary | ICD-10-CM

## 2014-05-10 ENCOUNTER — Encounter: Payer: Self-pay | Admitting: Internal Medicine

## 2014-05-11 ENCOUNTER — Other Ambulatory Visit (INDEPENDENT_AMBULATORY_CARE_PROVIDER_SITE_OTHER): Payer: Medicare Other | Admitting: *Deleted

## 2014-05-11 ENCOUNTER — Ambulatory Visit (INDEPENDENT_AMBULATORY_CARE_PROVIDER_SITE_OTHER): Payer: Medicare Other | Admitting: *Deleted

## 2014-05-11 ENCOUNTER — Other Ambulatory Visit (HOSPITAL_COMMUNITY): Payer: Medicare Other

## 2014-05-11 DIAGNOSIS — E785 Hyperlipidemia, unspecified: Secondary | ICD-10-CM

## 2014-05-11 DIAGNOSIS — I639 Cerebral infarction, unspecified: Secondary | ICD-10-CM

## 2014-05-11 DIAGNOSIS — Z5181 Encounter for therapeutic drug level monitoring: Secondary | ICD-10-CM | POA: Diagnosis not present

## 2014-05-11 DIAGNOSIS — I6350 Cerebral infarction due to unspecified occlusion or stenosis of unspecified cerebral artery: Secondary | ICD-10-CM

## 2014-05-11 DIAGNOSIS — I482 Chronic atrial fibrillation, unspecified: Secondary | ICD-10-CM

## 2014-05-11 DIAGNOSIS — I4891 Unspecified atrial fibrillation: Secondary | ICD-10-CM

## 2014-05-11 LAB — LIPID PANEL
CHOLESTEROL: 159 mg/dL (ref 0–200)
HDL: 31.2 mg/dL — ABNORMAL LOW (ref >39.00)
NonHDL: 127.8
TRIGLYCERIDES: 337 mg/dL — AB (ref 0.0–149.0)
Total CHOL/HDL Ratio: 5
VLDL: 67.4 mg/dL — ABNORMAL HIGH (ref 0.0–40.0)

## 2014-05-11 LAB — LDL CHOLESTEROL, DIRECT: Direct LDL: 64.8 mg/dL

## 2014-05-11 LAB — POCT INR: INR: 2.5

## 2014-05-12 ENCOUNTER — Other Ambulatory Visit: Payer: Self-pay

## 2014-05-12 MED ORDER — ATORVASTATIN CALCIUM 40 MG PO TABS: 40.0000 mg | ORAL_TABLET | Freq: Every day | ORAL | Status: DC

## 2014-05-21 ENCOUNTER — Ambulatory Visit (HOSPITAL_COMMUNITY): Payer: Medicare Other | Attending: Cardiovascular Disease | Admitting: *Deleted

## 2014-05-21 ENCOUNTER — Other Ambulatory Visit: Payer: Self-pay | Admitting: Family Medicine

## 2014-05-21 DIAGNOSIS — I6523 Occlusion and stenosis of bilateral carotid arteries: Secondary | ICD-10-CM | POA: Diagnosis not present

## 2014-05-21 NOTE — Telephone Encounter (Signed)
Rx printed and signed and faxed to the pharmacy.//AB/CMA

## 2014-05-21 NOTE — Progress Notes (Signed)
Carotid Duplex Exam Performed 

## 2014-05-21 NOTE — Telephone Encounter (Signed)
Requesting:LORazepam (ATIVAN) 0.5 MG tablet  Contract on file UDS no   Last OV 03/08/2014 Last Refill 04/05/2014  Please Advise

## 2014-05-28 ENCOUNTER — Telehealth: Payer: Self-pay | Admitting: Internal Medicine

## 2014-05-28 NOTE — Telephone Encounter (Signed)
New message      Pt need her warfarin refilled but the manufacturer has changed.  Please call [facility] at [address] to ok manufacturer change.

## 2014-05-28 NOTE — Telephone Encounter (Signed)
Pharmacist at [facility] states they don't need approved or new order just waiting for meds to come into store. They will call us back if they need further clarification.

## 2014-05-30 ENCOUNTER — Other Ambulatory Visit: Payer: Self-pay | Admitting: Internal Medicine

## 2014-06-08 ENCOUNTER — Ambulatory Visit (INDEPENDENT_AMBULATORY_CARE_PROVIDER_SITE_OTHER): Payer: Medicare Other | Admitting: *Deleted

## 2014-06-08 DIAGNOSIS — I4891 Unspecified atrial fibrillation: Secondary | ICD-10-CM

## 2014-06-08 DIAGNOSIS — I482 Chronic atrial fibrillation, unspecified: Secondary | ICD-10-CM

## 2014-06-08 DIAGNOSIS — Z5181 Encounter for therapeutic drug level monitoring: Secondary | ICD-10-CM

## 2014-06-08 DIAGNOSIS — I6350 Cerebral infarction due to unspecified occlusion or stenosis of unspecified cerebral artery: Secondary | ICD-10-CM

## 2014-06-08 DIAGNOSIS — I639 Cerebral infarction, unspecified: Secondary | ICD-10-CM | POA: Diagnosis not present

## 2014-06-08 LAB — POCT INR: INR: 3

## 2014-07-07 ENCOUNTER — Ambulatory Visit (INDEPENDENT_AMBULATORY_CARE_PROVIDER_SITE_OTHER): Payer: Medicare Other | Admitting: *Deleted

## 2014-07-07 DIAGNOSIS — I255 Ischemic cardiomyopathy: Secondary | ICD-10-CM

## 2014-07-07 DIAGNOSIS — I5022 Chronic systolic (congestive) heart failure: Secondary | ICD-10-CM

## 2014-07-07 NOTE — Progress Notes (Signed)
Remote ICD transmission.   

## 2014-07-08 LAB — MDC_IDC_ENUM_SESS_TYPE_REMOTE
Battery Remaining Longevity: 32 mo
Battery Remaining Percentage: 43 %
Battery Voltage: 2.9 V
Brady Statistic RA Percent Paced: 93 %
Date Time Interrogation Session: 20160224143328
HIGH POWER IMPEDANCE MEASURED VALUE: 47 Ohm
Implantable Pulse Generator Serial Number: 631685
Lead Channel Impedance Value: 300 Ohm
Lead Channel Impedance Value: 380 Ohm
Lead Channel Pacing Threshold Amplitude: 1.25 V
Lead Channel Pacing Threshold Pulse Width: 0.5 ms
Lead Channel Sensing Intrinsic Amplitude: 2.7 mV
Lead Channel Setting Pacing Amplitude: 1.75 V
Lead Channel Setting Sensing Sensitivity: 0.5 mV
MDC IDC MSMT LEADCHNL LV IMPEDANCE VALUE: 280 Ohm
MDC IDC MSMT LEADCHNL LV PACING THRESHOLD PULSEWIDTH: 0.6 ms
MDC IDC MSMT LEADCHNL RA PACING THRESHOLD AMPLITUDE: 0.75 V
MDC IDC MSMT LEADCHNL RA PACING THRESHOLD PULSEWIDTH: 0.5 ms
MDC IDC MSMT LEADCHNL RV PACING THRESHOLD AMPLITUDE: 0.75 V
MDC IDC MSMT LEADCHNL RV SENSING INTR AMPL: 11.7 mV
MDC IDC SET LEADCHNL LV PACING AMPLITUDE: 2.25 V
MDC IDC SET LEADCHNL LV PACING PULSEWIDTH: 0.6 ms
MDC IDC SET LEADCHNL RV PACING AMPLITUDE: 2.5 V
MDC IDC SET LEADCHNL RV PACING PULSEWIDTH: 0.5 ms
MDC IDC SET ZONE DETECTION INTERVAL: 310 ms
MDC IDC STAT BRADY AP VP PERCENT: 93 %
MDC IDC STAT BRADY AP VS PERCENT: 1 %
MDC IDC STAT BRADY AS VP PERCENT: 7 %
MDC IDC STAT BRADY AS VS PERCENT: 1 %
Zone Setting Detection Interval: 250 ms
Zone Setting Detection Interval: 345 ms

## 2014-07-14 ENCOUNTER — Other Ambulatory Visit: Payer: Self-pay | Admitting: Family Medicine

## 2014-07-14 NOTE — Telephone Encounter (Signed)
Last filled:  05/21/14 Amt: 45, 0 refills Last OV: 03/08/14  Please advise.

## 2014-07-15 ENCOUNTER — Encounter (HOSPITAL_COMMUNITY): Payer: Self-pay

## 2014-07-15 ENCOUNTER — Ambulatory Visit (INDEPENDENT_AMBULATORY_CARE_PROVIDER_SITE_OTHER): Payer: Medicare Other | Admitting: Pharmacist

## 2014-07-15 ENCOUNTER — Ambulatory Visit (HOSPITAL_COMMUNITY)
Admission: RE | Admit: 2014-07-15 | Discharge: 2014-07-15 | Disposition: A | Discharge status: Home / Self Care | Payer: Medicare Other | Source: Ambulatory Visit | Attending: Cardiology | Admitting: Cardiology

## 2014-07-15 VITALS — BP 116/55 | HR 75 | Resp 18 | Wt 261.5 lb

## 2014-07-15 DIAGNOSIS — I480 Paroxysmal atrial fibrillation: Secondary | ICD-10-CM | POA: Insufficient documentation

## 2014-07-15 DIAGNOSIS — E039 Hypothyroidism, unspecified: Secondary | ICD-10-CM | POA: Diagnosis not present

## 2014-07-15 DIAGNOSIS — N183 Chronic kidney disease, stage 3 (moderate): Secondary | ICD-10-CM | POA: Diagnosis not present

## 2014-07-15 DIAGNOSIS — Z79899 Other long term (current) drug therapy: Secondary | ICD-10-CM | POA: Diagnosis not present

## 2014-07-15 DIAGNOSIS — R609 Edema, unspecified: Secondary | ICD-10-CM | POA: Diagnosis not present

## 2014-07-15 DIAGNOSIS — I2510 Atherosclerotic heart disease of native coronary artery without angina pectoris: Secondary | ICD-10-CM | POA: Diagnosis not present

## 2014-07-15 DIAGNOSIS — I4891 Unspecified atrial fibrillation: Secondary | ICD-10-CM

## 2014-07-15 DIAGNOSIS — Z95810 Presence of automatic (implantable) cardiac defibrillator: Secondary | ICD-10-CM | POA: Insufficient documentation

## 2014-07-15 DIAGNOSIS — I6523 Occlusion and stenosis of bilateral carotid arteries: Secondary | ICD-10-CM | POA: Diagnosis not present

## 2014-07-15 DIAGNOSIS — E119 Type 2 diabetes mellitus without complications: Secondary | ICD-10-CM | POA: Insufficient documentation

## 2014-07-15 DIAGNOSIS — I639 Cerebral infarction, unspecified: Secondary | ICD-10-CM

## 2014-07-15 DIAGNOSIS — G4733 Obstructive sleep apnea (adult) (pediatric): Secondary | ICD-10-CM | POA: Insufficient documentation

## 2014-07-15 DIAGNOSIS — Z794 Long term (current) use of insulin: Secondary | ICD-10-CM | POA: Diagnosis not present

## 2014-07-15 DIAGNOSIS — Z5181 Encounter for therapeutic drug level monitoring: Secondary | ICD-10-CM

## 2014-07-15 DIAGNOSIS — R0609 Other forms of dyspnea: Secondary | ICD-10-CM

## 2014-07-15 DIAGNOSIS — I482 Chronic atrial fibrillation, unspecified: Secondary | ICD-10-CM

## 2014-07-15 DIAGNOSIS — I5042 Chronic combined systolic (congestive) and diastolic (congestive) heart failure: Secondary | ICD-10-CM

## 2014-07-15 DIAGNOSIS — Z951 Presence of aortocoronary bypass graft: Secondary | ICD-10-CM | POA: Insufficient documentation

## 2014-07-15 DIAGNOSIS — I129 Hypertensive chronic kidney disease with stage 1 through stage 4 chronic kidney disease, or unspecified chronic kidney disease: Secondary | ICD-10-CM | POA: Insufficient documentation

## 2014-07-15 DIAGNOSIS — I255 Ischemic cardiomyopathy: Secondary | ICD-10-CM | POA: Insufficient documentation

## 2014-07-15 DIAGNOSIS — M109 Gout, unspecified: Secondary | ICD-10-CM | POA: Insufficient documentation

## 2014-07-15 DIAGNOSIS — E785 Hyperlipidemia, unspecified: Secondary | ICD-10-CM | POA: Diagnosis not present

## 2014-07-15 DIAGNOSIS — Z7901 Long term (current) use of anticoagulants: Secondary | ICD-10-CM | POA: Diagnosis not present

## 2014-07-15 DIAGNOSIS — I5022 Chronic systolic (congestive) heart failure: Secondary | ICD-10-CM | POA: Diagnosis not present

## 2014-07-15 DIAGNOSIS — Z6841 Body Mass Index (BMI) 40.0 and over, adult: Secondary | ICD-10-CM | POA: Insufficient documentation

## 2014-07-15 DIAGNOSIS — I6350 Cerebral infarction due to unspecified occlusion or stenosis of unspecified cerebral artery: Secondary | ICD-10-CM

## 2014-07-15 LAB — BRAIN NATRIURETIC PEPTIDE: B Natriuretic Peptide: 188.8 pg/mL — ABNORMAL HIGH (ref 0.0–100.0)

## 2014-07-15 LAB — BASIC METABOLIC PANEL
Anion gap: 6 (ref 5–15)
BUN: 43 mg/dL — AB (ref 6–23)
CO2: 28 mmol/L (ref 19–32)
CREATININE: 1.8 mg/dL — AB (ref 0.50–1.10)
Calcium: 8.8 mg/dL (ref 8.4–10.5)
Chloride: 104 mmol/L (ref 96–112)
GFR calc Af Amer: 32 mL/min — ABNORMAL LOW (ref >90)
GFR calc non Af Amer: 27 mL/min — ABNORMAL LOW (ref >90)
Glucose, Bld: 128 mg/dL — ABNORMAL HIGH (ref 70–99)
Potassium: 4.7 mmol/L (ref 3.5–5.1)
SODIUM: 138 mmol/L (ref 135–145)

## 2014-07-15 LAB — POCT INR: INR: 2.4

## 2014-07-15 MED ORDER — ATORVASTATIN CALCIUM 20 MG PO TABS: 20.0000 mg | ORAL_TABLET | Freq: Every day | ORAL | Status: DC

## 2014-07-15 MED ORDER — CVS FISH OIL 1000 MG PO CAPS: 2.0000 | ORAL_CAPSULE | Freq: Every day | ORAL | Status: DC

## 2014-07-15 MED ORDER — FENOFIBRATE 48 MG PO TABS: 48.0000 mg | ORAL_TABLET | Freq: Every day | ORAL | Status: DC

## 2014-07-15 MED ORDER — TORSEMIDE 20 MG PO TABS: 40.0000 mg | ORAL_TABLET | Freq: Once | ORAL | Status: DC

## 2014-07-15 NOTE — Progress Notes (Addendum)
[name], female   DOB: [birth year], 74 y.o.   MRN: [id number] PCP: Dr Charlett Blake Nephrologist: Dr Marval Regal EP: Dr Lovena Le  Pulmonary: Dr Gwenette Greet  HPI: Destiny [name] is a 74 y.o. female with a history of CAD, status post CABG in 2003, ischemic cardiomyopathy with variable ejection fractions noted in the past (echo in 12/2011 demonstrated normal LV function with an EF 60-65%), status post CRT-D, chronic combined systolic and diastolic CHF, paroxysmal atrial fibrillation, prior LV mural thrombus, on chronic Coumadin therapy, CKD, T2DM. She has a prior admission to the hospital for acute renal failure in the setting of rhabdomyolysis (while taking colchicine and fenofibrate). Carotid US (12/14): Bilateral ICA stenosis 40-59%.    She was admitted 10/23-11/11/14 with acute on chronic combined systolic and diastolic CHF. ECHO with worsening LV function with an EF of 35-40%. Hospital stay was complicated by a/c renal failure and respiratory failure. Had short term intubation. She was made a DNR/DNI.  Discharge weight 256 lbs. She is on home oxygen 2L by nasal cannula and CPAP at night, OHS/OSA.   She returns for follow up.  She is quite limited.  She uses a motorized scooter outside the house and a walker in the house.  Balance is poor and she has a lot of hip pain and knee pain.  She has dyspnea with housework like loading the dishwasher. She can walk around her house without dyspnea.  Weight is up 4 lbs.  No chest pain.  She is not taking atorvastatin currently as she had myalgias when it was increased to 40 mg daily.  She is not on Tricor as it was too expensive.   ECHO (03/06/13): EF 35-40% ECHO (09/07/13): EF 40%, grade II diastolic dysfunction, mildly decreased RV systolic function with normal RV size.  Carotid dopplers (1/16) with 40-59% bilateral ICA stenosis.   Labs  05/01/13 K 5.4 Creatinine 1.46 Pro BNP 646  05/19/13 K 5.0 Creatinine 1.38 3/15 LDL 41, HDL 38, creatinine 1.7 4/15 K  5.2, creatinine 1.8 09/16/13 K 4.6 Creatinine 1.6 10/15 K 5.2, creatinine 1.6, LDL 71, HDL 33, TGs 425, TSH normal 12/15 LDL 65, TGs 337  ROS: All systems negative except as listed in HPI, PMH and Problem List.  Past Medical History  Diagnosis Date  . LBBB (left bundle branch block)   . Carotid stenosis     a. 10/19/2011 carotid duplex - Mild hard plaque bilaterally. Stable 40-59% bilateral ICA stenosis. Carotid US (04/2013):  Bilateral 40-59% ICA.  F/u 1 year  . Dyslipidemia   . Hypertension   . Chronic combined systolic and diastolic CHF (congestive heart failure)     a. EF as low as 25% in 2006;  b. EF 60-65% in 12/2011;  c. 02/2013 Echo: EF 35-40, mod mid-dist antsept HK, Gr 2 DD, mild LVH.  . Back pain, chronic     "just when I walk; mass on 3rd and 4th vertebrae right lower back"  . Cardiomyopathy, ischemic     a. 2012 s/p SJM 3231-40 Uni BiV ICD, ser # [id number].  Marland Kitchen CAD (coronary artery disease)     a. 05/2001 CABG x4: LIMA->LAD, VG->D1, VG->D2, VG->RCA.  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 CKD (chronic kidney disease), stage III   . Biventricular implantable cardiac defibrillator in situ 2007, 2012    a. 2007;  b. 2012 Gen change: SJM 3231-40 Uni BiV ICD, ser # [id number].  Marland Kitchen Hypoxemia requiring  supplemental oxygen   . Candidiasis of vulva and vagina   . Hypothyroidism   . 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 Benay Spice, in remission  . History of pneumonia 12/27/11    "3 times; it's been a long time ago"  . OSA on CPAP   . Gout ~ 08/2011  . PAF (paroxysmal atrial fibrillation)     on Coumadin  . DM (diabetes mellitus), type 2 with complications     insulin dependent, retinopathy, neuropathy  . Mural thrombus of left ventricle     before 2003, while on Coumadin, No h/o CVA  . Morbid obesity with BMI of 45.0-49.9, adult     Ht. 5'4". BMI 47.2  . Vitamin D deficiency 06/09/2012    historical  . Olecranon  bursitis of right elbow 12/29/2012  . History of recurrent UTIs   . Diabetes 05/24/2013  . Hypercalcemia 09/26/2013  . Anemia, unspecified 12/13/2013  . Renal insufficiency 03/14/2014    Following with [facility], Dr Loyal Buba    Current Outpatient Prescriptions  Medication Sig Dispense Refill  . albuterol (PROVENTIL HFA;VENTOLIN HFA) 108 (90 BASE) MCG/ACT inhaler Inhale 2 puffs into the lungs every 6 (six) hours as needed for wheezing or shortness of breath. 1 Inhaler 1  . amLODipine (NORVASC) 5 MG tablet TAKE 1 TABLET EVERY DAY 90 tablet 1  . carvedilol (COREG) 25 MG tablet TAKE 1 TABLET TWICE DAILY  WITH  A  MEAL. 180 tablet 6  . enalapril (VASOTEC) 2.5 MG tablet TAKE 1 TABLET TWICE DAILY. 180 tablet 6  . escitalopram (LEXAPRO) 10 MG tablet TAKE 1 TABLET EVERY DAY 90 tablet 0  . fluticasone (FLONASE) 50 MCG/ACT nasal spray Place 2 sprays into both nostrils daily as needed for allergies or rhinitis. As needed for nasal stuffiness. 16 g 0  . gabapentin (NEURONTIN) 300 MG capsule TAKE 1 CAPSULE TWICE DAILY 180 capsule 0  . glucose blood (FREESTYLE LITE) test strip DX: 250.60  Check sugars bid and as needed 100 each 2  . insulin NPH (HUMULIN N,NOVOLIN N) 100 UNIT/ML injection Inject 25-44 Units into the skin 2 (two) times daily before a meal. Take 44 units in the morning and 25 units in the evening    . insulin regular (NOVOLIN R,HUMULIN R) 100 units/mL injection Change to 15 units three times a with meals (to cover all meals--this is the same total amount but spread out) 10 mL 12  . levothyroxine (SYNTHROID, LEVOTHROID) 25 MCG tablet TAKE 1 TABLET EVERY DAY BEFORE BREAKFAST 90 tablet 1  . LORazepam (ATIVAN) 0.5 MG tablet TAKE ONE TABLET BY MOUTH TWICE DAILY AS NEEDED FOR ANXIETY OR SLEEP 45 tablet 0  . NON FORMULARY Oxygen---24 hour    . torsemide (DEMADEX) 20 MG tablet Take 2 tablets (40 mg total) by mouth once. may take an extra tab for 3# weight gain, swelling or SOB 135 tablet 3  .  traMADol (ULTRAM) 50 MG tablet Take 1 tablet (50 mg total) by mouth every 12 (twelve) hours as needed. 60 tablet 1  . Vitamin D, Ergocalciferol, (DRISDOL) 50000 UNITS CAPS capsule Take 50,000 Units by mouth every 7 (seven) days.    Marland Kitchen warfarin (COUMADIN) 5 MG tablet TAKE AS DIRECTED BY ANTICOAGULATION CLINIC 45 tablet 3  . atorvastatin (LIPITOR) 20 MG tablet Take 1 tablet (20 mg total) by mouth daily. 90 tablet 3  . fenofibrate (TRICOR) 48 MG tablet Take 1 tablet (48 mg total) by mouth daily.  90 tablet 3  . Omega-3 Fatty Acids ([employer] FISH OIL) 1000 MG CAPS Take 2 capsules by mouth daily. 60 capsule 3   No current facility-administered medications for this encounter.    Filed Vitals:   07/15/14 0907  BP: 116/55  Pulse: 75  Resp: 18  Weight: 261 lb 8 oz (118.616 kg)  SpO2: 90%    PHYSICAL EXAM: General:  Obese. No resp difficulty; on 2 L continuous. Husband present  HEENT: normal Neck: supple. JVP 8-9 cm. Carotids 2+ bilaterally; no bruits. No lymphadenopathy or thryomegaly appreciated. Cor: PMI normal. Regular rate & rhythm. No rubs, gallops.  1/6 early SEM RUSB. Lungs: clear Abdomen: Obese, soft, nontender, obese, nondistended. No hepatosplenomegaly. No bruits or masses. Good bowel sounds. Extremities: no cyanosis, clubbing, rash. 1+ edema 1/2 up lung fields bilaterally.  Neuro: alert & orientedx3, cranial nerves grossly intact. Moves all 4 extremities w/o difficulty. Affect pleasant.  ASSESSMENT & PLAN:  1) Chronic systolic HF: Ischemic cardiomyopathy s/p CRT-D ([employer]). Echo 08/2013: EF is about 40%.  NYHA III symptoms but seems more limited by hip and low back. She does appear to have some volume overload today. - Increase torsemide to 40 mg daily today with BMET/BNP today and in 2 wks.  - Continue current Coreg.  - Continue enalapril to 2.5 mg bid.  No uptitration. Has had low BP at home and dizziness.  - No spironolactone for now given mildly elevated K on recent BMET => follow  low K diet.   - Reinforced the need and importance of daily weights, a low sodium diet, and fluid restriction (less than 2 L a day). Instructed to call the HF clinic if weight increases more than 3 lbs overnight or 5 lbs in a week. 2) PAF: She is not in atrial fibrillation currently.  Continue BB and Coumadin. No bleeding problems. 3) OHS/OSA: Continue nightly CPAP and home oxygen.  4) CAD: s/p CABG.  No chest pain.  She is not on ASA given comadin use and stable CAD. Restart statin => she can tolerate atorvastatin 20 but not 40 due to myalgias.  5) Hyperlipidemia: Restart atorvastatin 20 mg daily (she has been able to tolerate this dose). Triglycerides very high, will try to get her on generic fenofibrate 48 mg daily.  6) Carotid stenosis: Repeat dopplers in 1/17.   Follow up in 6 weeks.   Loralie Champagne  07/15/2014

## 2014-07-15 NOTE — Patient Instructions (Signed)
INCREASE Torsemide to 40mg  (2 tablets) once daily.  RESTART Atorvastatin at lower dose of 20mg  (1 tablet) once daily.  START Fish oil 2 grams (2 capsules) once daily.  START generic Fenofibrate 48mg  once daily.  Return in 2 weeks for repeat blood work.  Follow up 6 weeks for routine appointment.  Do the following things EVERYDAY: 1) Weigh yourself in the morning before breakfast. Write it down and keep it in a log. 2) Take your medicines as prescribed 3) Eat low salt foods-Limit salt (sodium) to 2000 mg per day.  4) Stay as active as you can everyday 5) Limit all fluids for the day to less than 2 liters

## 2014-07-15 NOTE — Telephone Encounter (Signed)
Faxed hardcopy for Lorazepam to [facility]

## 2014-07-16 ENCOUNTER — Encounter: Payer: Self-pay | Admitting: Cardiology

## 2014-07-16 ENCOUNTER — Ambulatory Visit (HOSPITAL_BASED_OUTPATIENT_CLINIC_OR_DEPARTMENT_OTHER): Payer: Medicare Other | Admitting: Nurse Practitioner

## 2014-07-16 VITALS — BP 119/44 | HR 76 | Temp 97.7°F | Resp 18 | Ht 64.0 in | Wt 264.6 lb

## 2014-07-16 DIAGNOSIS — Z8572 Personal history of non-Hodgkin lymphomas: Secondary | ICD-10-CM

## 2014-07-16 DIAGNOSIS — G629 Polyneuropathy, unspecified: Secondary | ICD-10-CM

## 2014-07-16 DIAGNOSIS — C8590 Non-Hodgkin lymphoma, unspecified, unspecified site: Secondary | ICD-10-CM

## 2014-07-16 NOTE — Progress Notes (Signed)
  [facility] OFFICE PROGRESS NOTE   Diagnosis:  Non-Hodgkin's lymphoma  INTERVAL HISTORY:   Destiny [name] returns as scheduled. She denies fevers and sweats. No enlarged lymph nodes. She has a good appetite. No weight loss. She fatigues easily. She is maintained on home oxygen at 2 L/m. She ambulates with a walker. No recent hospitalizations.  Objective:  Vital signs in last 24 hours:  Blood pressure 119/44, pulse 76, temperature 97.7 F (36.5 C), temperature source Oral, resp. rate 18, height 5\' 4"  (1.626 m), weight 264 lb 9.6 oz (120.022 kg).    HEENT: No thrush or ulcers. Lymphatics: No palpable cervical, supra clavicular, axillary or inguinal lymph nodes. Left thigh without mass. Resp: Lungs clear bilaterally. Cardio: Regular rate and rhythm. GI: No organomegaly. Vascular: No leg edema.   Lab Results:  Lab Results  Component Value Date   WBC 8.4 03/08/2014   HGB 12.8 03/08/2014   HCT 39.2 03/08/2014   MCV 89.4 03/08/2014   PLT 177.0 03/08/2014   NEUTROABS 4.9 04/21/2013    Imaging:  No results found.  Medications: I have reviewed the patient's current medications.  Assessment/Plan: 1. Non-Hodgkin's lymphoma, high-grade B-cell lymphoma, involving left inguinal mass, status post core biopsy 03/21/2009. Staging PET scan on 03/16/2009 showed a left inguinal nodal conglomerate measuring 7 x 6.3 cm with intense hypermetabolic activity with additional hypermetabolic tumor and mass-like swelling within the muscle groups extending to the left knee with a permeative appearance of the distal femoral metaphysis and femoral condyles. She completed 6 cycles of CHOP (Adriamycin was deleted and etoposide substituted) and rituximab with marked clinical and radiographic improvement. 2. Hospitalization 11/25 through 04/10/2009 with acute exacerbation of diastolic congestive heart failure, likely secondary to Lasix being held and intravenous fluids given with cycle 1 of  chemotherapy. 3. Ischemic cardiomyopathy status post implantation of a biventricular ICD 08/02/2005 with improvement in the ejection fraction. 4. Insulin-dependent diabetes. 5. Hypertension. 6. Hypercholesterolemia.  7. Hypertriglyceridemia. 8. Sleep apnea on CPAP and home oxygen. 9. History of peripheral neuropathy, unchanged following chemotherapy. 10. Chronic low back pain followed by Dr. Nelva Bush. 11. Hospitalization September 2013 with acute CHF and cellulitis. 12. Admission with heart failure/ respiratory failure October 2014   Disposition: Destiny [name] remains in clinical remission from non-Hodgkin's lymphoma. She is now greater than 5 years out from diagnosis. We discussed returning for a follow-up visit in one year versus being released. She is comfortable being released from the practice. She understands to contact the office if she develops any worrisome symptoms.  Plan reviewed with Dr. Benay Spice.  Ned Card ANP/GNP-BC   07/16/2014  12:47 PM

## 2014-07-21 ENCOUNTER — Encounter: Payer: Self-pay | Admitting: Internal Medicine

## 2014-07-27 ENCOUNTER — Other Ambulatory Visit: Payer: Self-pay | Admitting: Family Medicine

## 2014-07-28 ENCOUNTER — Encounter (HOSPITAL_COMMUNITY): Payer: Self-pay | Admitting: Emergency Medicine

## 2014-07-28 ENCOUNTER — Telehealth (HOSPITAL_COMMUNITY): Payer: Self-pay

## 2014-07-28 ENCOUNTER — Emergency Department (HOSPITAL_COMMUNITY): Payer: Medicare Other

## 2014-07-28 ENCOUNTER — Other Ambulatory Visit: Payer: Self-pay

## 2014-07-28 ENCOUNTER — Inpatient Hospital Stay (HOSPITAL_COMMUNITY)
Admission: EM | Admit: 2014-07-28 | Discharge: 2014-07-31 | DRG: 291 | Disposition: A | Discharge status: Home / Self Care | Payer: Medicare Other | Attending: Internal Medicine | Admitting: Internal Medicine

## 2014-07-28 ENCOUNTER — Other Ambulatory Visit (HOSPITAL_COMMUNITY): Payer: Self-pay

## 2014-07-28 ENCOUNTER — Ambulatory Visit (HOSPITAL_BASED_OUTPATIENT_CLINIC_OR_DEPARTMENT_OTHER)
Admission: RE | Admit: 2014-07-28 | Discharge: 2014-07-28 | Disposition: A | Discharge status: Home / Self Care | Payer: Medicare Other | Source: Ambulatory Visit | Attending: Internal Medicine | Admitting: Internal Medicine

## 2014-07-28 DIAGNOSIS — Z7901 Long term (current) use of anticoagulants: Secondary | ICD-10-CM

## 2014-07-28 DIAGNOSIS — M549 Dorsalgia, unspecified: Secondary | ICD-10-CM | POA: Diagnosis present

## 2014-07-28 DIAGNOSIS — I129 Hypertensive chronic kidney disease with stage 1 through stage 4 chronic kidney disease, or unspecified chronic kidney disease: Secondary | ICD-10-CM | POA: Diagnosis not present

## 2014-07-28 DIAGNOSIS — Z66 Do not resuscitate: Secondary | ICD-10-CM | POA: Diagnosis present

## 2014-07-28 DIAGNOSIS — E1122 Type 2 diabetes mellitus with diabetic chronic kidney disease: Secondary | ICD-10-CM | POA: Diagnosis present

## 2014-07-28 DIAGNOSIS — Z87440 Personal history of urinary (tract) infections: Secondary | ICD-10-CM | POA: Diagnosis not present

## 2014-07-28 DIAGNOSIS — Z882 Allergy status to sulfonamides status: Secondary | ICD-10-CM | POA: Diagnosis not present

## 2014-07-28 DIAGNOSIS — Z6841 Body Mass Index (BMI) 40.0 and over, adult: Secondary | ICD-10-CM

## 2014-07-28 DIAGNOSIS — N183 Chronic kidney disease, stage 3 unspecified: Secondary | ICD-10-CM | POA: Diagnosis present

## 2014-07-28 DIAGNOSIS — Z794 Long term (current) use of insulin: Secondary | ICD-10-CM | POA: Diagnosis not present

## 2014-07-28 DIAGNOSIS — I509 Heart failure, unspecified: Secondary | ICD-10-CM | POA: Diagnosis not present

## 2014-07-28 DIAGNOSIS — D649 Anemia, unspecified: Secondary | ICD-10-CM | POA: Diagnosis present

## 2014-07-28 DIAGNOSIS — I5043 Acute on chronic combined systolic (congestive) and diastolic (congestive) heart failure: Secondary | ICD-10-CM | POA: Insufficient documentation

## 2014-07-28 DIAGNOSIS — I10 Essential (primary) hypertension: Secondary | ICD-10-CM | POA: Diagnosis not present

## 2014-07-28 DIAGNOSIS — Z9842 Cataract extraction status, left eye: Secondary | ICD-10-CM | POA: Diagnosis not present

## 2014-07-28 DIAGNOSIS — E119 Type 2 diabetes mellitus without complications: Secondary | ICD-10-CM | POA: Diagnosis not present

## 2014-07-28 DIAGNOSIS — Z90710 Acquired absence of both cervix and uterus: Secondary | ICD-10-CM | POA: Diagnosis not present

## 2014-07-28 DIAGNOSIS — I480 Paroxysmal atrial fibrillation: Secondary | ICD-10-CM | POA: Diagnosis present

## 2014-07-28 DIAGNOSIS — Z951 Presence of aortocoronary bypass graft: Secondary | ICD-10-CM

## 2014-07-28 DIAGNOSIS — Z8572 Personal history of non-Hodgkin lymphomas: Secondary | ICD-10-CM | POA: Diagnosis not present

## 2014-07-28 DIAGNOSIS — J9621 Acute and chronic respiratory failure with hypoxia: Secondary | ICD-10-CM | POA: Diagnosis not present

## 2014-07-28 DIAGNOSIS — I482 Chronic atrial fibrillation: Secondary | ICD-10-CM | POA: Diagnosis not present

## 2014-07-28 DIAGNOSIS — G8929 Other chronic pain: Secondary | ICD-10-CM | POA: Diagnosis present

## 2014-07-28 DIAGNOSIS — I4891 Unspecified atrial fibrillation: Secondary | ICD-10-CM | POA: Diagnosis not present

## 2014-07-28 DIAGNOSIS — Z95810 Presence of automatic (implantable) cardiac defibrillator: Secondary | ICD-10-CM | POA: Diagnosis not present

## 2014-07-28 DIAGNOSIS — E785 Hyperlipidemia, unspecified: Secondary | ICD-10-CM | POA: Diagnosis present

## 2014-07-28 DIAGNOSIS — I5041 Acute combined systolic (congestive) and diastolic (congestive) heart failure: Secondary | ICD-10-CM | POA: Diagnosis not present

## 2014-07-28 DIAGNOSIS — E559 Vitamin D deficiency, unspecified: Secondary | ICD-10-CM | POA: Diagnosis present

## 2014-07-28 DIAGNOSIS — I2510 Atherosclerotic heart disease of native coronary artery without angina pectoris: Secondary | ICD-10-CM | POA: Diagnosis present

## 2014-07-28 DIAGNOSIS — G4733 Obstructive sleep apnea (adult) (pediatric): Secondary | ICD-10-CM | POA: Diagnosis present

## 2014-07-28 DIAGNOSIS — Z9049 Acquired absence of other specified parts of digestive tract: Secondary | ICD-10-CM | POA: Diagnosis present

## 2014-07-28 DIAGNOSIS — E1140 Type 2 diabetes mellitus with diabetic neuropathy, unspecified: Secondary | ICD-10-CM | POA: Diagnosis present

## 2014-07-28 DIAGNOSIS — E118 Type 2 diabetes mellitus with unspecified complications: Secondary | ICD-10-CM | POA: Diagnosis present

## 2014-07-28 DIAGNOSIS — N179 Acute kidney failure, unspecified: Secondary | ICD-10-CM | POA: Diagnosis present

## 2014-07-28 DIAGNOSIS — E039 Hypothyroidism, unspecified: Secondary | ICD-10-CM | POA: Diagnosis present

## 2014-07-28 DIAGNOSIS — D689 Coagulation defect, unspecified: Secondary | ICD-10-CM | POA: Diagnosis not present

## 2014-07-28 DIAGNOSIS — Z8701 Personal history of pneumonia (recurrent): Secondary | ICD-10-CM

## 2014-07-28 DIAGNOSIS — J90 Pleural effusion, not elsewhere classified: Secondary | ICD-10-CM | POA: Diagnosis not present

## 2014-07-28 DIAGNOSIS — R0602 Shortness of breath: Secondary | ICD-10-CM | POA: Diagnosis not present

## 2014-07-28 DIAGNOSIS — I255 Ischemic cardiomyopathy: Secondary | ICD-10-CM | POA: Diagnosis present

## 2014-07-28 DIAGNOSIS — I5022 Chronic systolic (congestive) heart failure: Secondary | ICD-10-CM

## 2014-07-28 DIAGNOSIS — E11319 Type 2 diabetes mellitus with unspecified diabetic retinopathy without macular edema: Secondary | ICD-10-CM | POA: Diagnosis present

## 2014-07-28 LAB — BRAIN NATRIURETIC PEPTIDE
B NATRIURETIC PEPTIDE 5: 171.5 pg/mL — AB (ref 0.0–100.0)
B Natriuretic Peptide: 353.9 pg/mL — ABNORMAL HIGH (ref 0.0–100.0)

## 2014-07-28 LAB — BASIC METABOLIC PANEL
ANION GAP: 10 (ref 5–15)
Anion gap: 7 (ref 5–15)
BUN: 44 mg/dL — ABNORMAL HIGH (ref 6–23)
BUN: 43 mg/dL — ABNORMAL HIGH (ref 6–23)
CALCIUM: 8.7 mg/dL (ref 8.4–10.5)
CHLORIDE: 104 mmol/L (ref 96–112)
CO2: 28 mmol/L (ref 19–32)
CO2: 25 mmol/L (ref 19–32)
Calcium: 8.8 mg/dL (ref 8.4–10.5)
Chloride: 104 mmol/L (ref 96–112)
Creatinine, Ser: 2.15 mg/dL — ABNORMAL HIGH (ref 0.50–1.10)
Creatinine, Ser: 2.02 mg/dL — ABNORMAL HIGH (ref 0.50–1.10)
GFR, EST AFRICAN AMERICAN: 25 mL/min — AB (ref >90)
GFR, EST AFRICAN AMERICAN: 27 mL/min — AB (ref >90)
GFR, EST NON AFRICAN AMERICAN: 22 mL/min — AB (ref >90)
GFR, EST NON AFRICAN AMERICAN: 24 mL/min — AB (ref >90)
GLUCOSE: 116 mg/dL — AB (ref 70–99)
Glucose, Bld: 107 mg/dL — ABNORMAL HIGH (ref 70–99)
POTASSIUM: 4.8 mmol/L (ref 3.5–5.1)
POTASSIUM: 4.7 mmol/L (ref 3.5–5.1)
SODIUM: 139 mmol/L (ref 135–145)
SODIUM: 139 mmol/L (ref 135–145)

## 2014-07-28 LAB — CBC
HCT: 36.6 % (ref 36.0–46.0)
Hemoglobin: 12 g/dL (ref 12.0–15.0)
MCH: 29.6 pg (ref 26.0–34.0)
MCHC: 32.8 g/dL (ref 30.0–36.0)
MCV: 90.4 fL (ref 78.0–100.0)
PLATELETS: 219 10*3/uL (ref 150–400)
RBC: 4.05 MIL/uL (ref 3.87–5.11)
RDW: 14.9 % (ref 11.5–15.5)
WBC: 14.7 10*3/uL — ABNORMAL HIGH (ref 4.0–10.5)

## 2014-07-28 LAB — PROTIME-INR
INR: 4.1 — ABNORMAL HIGH (ref 0.00–1.49)
Prothrombin Time: 40 seconds — ABNORMAL HIGH (ref 11.6–15.2)

## 2014-07-28 LAB — I-STAT TROPONIN, ED: TROPONIN I, POC: 0.02 ng/mL (ref 0.00–0.08)

## 2014-07-28 LAB — D-DIMER, QUANTITATIVE (NOT AT ARMC): D DIMER QUANT: 0.31 ug{FEU}/mL (ref 0.00–0.48)

## 2014-07-28 MED ORDER — FUROSEMIDE 10 MG/ML IJ SOLN
40.0000 mg | Freq: Once | INTRAMUSCULAR | Status: AC
  Administered 2014-07-28: 40 mg via INTRAVENOUS
  Filled 2014-07-28: qty 4

## 2014-07-28 NOTE — Telephone Encounter (Signed)
Patient called c/o increased SOB and WOB x days, getting worse this week.  Labs today were stable, BNP 171.  Patient states weight is stable at home, no swelling.  Does not seem to be fluid related.  Patient states she has seasonal allergies and asthma, and that her nose is "stopped up".  Advised to take some of her PRN allergy medication along with her inhalers.  Also encouraged to call Dr. Janifer Adie office who manages her CPAP to see if their triage nurse had any additional suggestions.  Aware and agreeable.

## 2014-07-28 NOTE — ED Provider Notes (Signed)
CSN: [id number]     Arrival date & time 07/28/14  2032 History   First MD Initiated Contact with Patient 07/28/14 2106     Chief Complaint  Patient presents with  . Shortness of Breath     (Consider location/radiation/quality/duration/timing/severity/associated sxs/prior Treatment) Patient is a 74 y.o. female presenting with shortness of breath. The history is provided by the patient.  Shortness of Breath Severity:  Severe Onset quality:  Gradual Timing:  Constant Progression:  Worsening Chronicity:  New Context: not URI   Relieved by:  Oxygen and sitting up Worsened by:  Exertion Associated symptoms: no abdominal pain, no chest pain, no cough, no sputum production, no swollen glands and no wheezing     Past Medical History  Diagnosis Date  . LBBB (left bundle branch block)   . Carotid stenosis     a. 10/19/2011 carotid duplex - Mild hard plaque bilaterally. Stable 40-59% bilateral ICA stenosis. Carotid US (04/2013):  Bilateral 40-59% ICA.  F/u 1 year  . Dyslipidemia   . Hypertension   . Chronic combined systolic and diastolic CHF (congestive heart failure)     a. EF as low as 25% in 2006;  b. EF 60-65% in 12/2011;  c. 02/2013 Echo: EF 35-40, mod mid-dist antsept HK, Gr 2 DD, mild LVH.  . Back pain, chronic     "just when I walk; mass on 3rd and 4th vertebrae right lower back"  . Cardiomyopathy, ischemic     a. 2012 s/p SJM 3231-40 Uni BiV ICD, ser # [id number].  Marland Kitchen CAD (coronary artery disease)     a. 05/2001 CABG x4: LIMA->LAD, VG->D1, VG->D2, VG->RCA.  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 CKD (chronic kidney disease), stage III   . Biventricular implantable cardiac defibrillator in situ 2007, 2012    a. 2007;  b. 2012 Gen change: SJM 3231-40 Uni BiV ICD, ser # [id number].  Marland Kitchen Hypoxemia requiring supplemental oxygen   . Candidiasis of vulva and vagina   . Hypothyroidism   . 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  Benay Spice, in remission  . History of pneumonia 12/27/11    "3 times; it's been a long time ago"  . OSA on CPAP   . Gout ~ 08/2011  . PAF (paroxysmal atrial fibrillation)     on Coumadin  . DM (diabetes mellitus), type 2 with complications     insulin dependent, retinopathy, neuropathy  . Mural thrombus of left ventricle     before 2003, while on Coumadin, No h/o CVA  . Morbid obesity with BMI of 45.0-49.9, adult     Ht. 5'4". BMI 47.2  . Vitamin D deficiency 06/09/2012    historical  . Olecranon bursitis of right elbow 12/29/2012  . History of recurrent UTIs   . Diabetes 05/24/2013  . Hypercalcemia 09/26/2013  . Anemia, unspecified 12/13/2013  . Renal insufficiency 03/14/2014    Following with  [facility], Dr Loyal Buba   Past Surgical History  Procedure Laterality Date  . Cardiac catheterization  06/03/01  . Cholecystectomy  1970  . Tubal ligation  1972  . Abdominal hysterectomy  1982  . Insert / replace / remove pacemaker  2007; 2012    w/AICD  . Coronary artery bypass graft  2003    CABG X4  . Cataract extraction      left eye   Family History  Problem Relation Age of Onset  . Kidney  cancer Mother     kidney and female repo - died @ 69  . Heart disease Father     died @ 18  . Asthma Mother   . Stroke Father    History  Substance Use Topics  . Smoking status: Never Smoker   . Smokeless tobacco: Never Used  . Alcohol Use: No   OB History    No data available     Review of Systems  Respiratory: Positive for shortness of breath. Negative for cough, sputum production and wheezing.   Cardiovascular: Negative for chest pain.  Gastrointestinal: Negative for abdominal pain.  All other systems reviewed and are negative.     Allergies  Sulfonamide derivatives  Home Medications   Prior to Admission medications   Medication Sig Start Date End Date Taking? Authorizing Provider  albuterol (PROVENTIL HFA;VENTOLIN HFA) 108 (90 BASE) MCG/ACT inhaler Inhale 2 puffs  into the lungs every 6 (six) hours as needed for wheezing or shortness of breath. 12/26/12  Yes Mosie Lukes, MD  amLODipine (NORVASC) 5 MG tablet TAKE 1 TABLET EVERY DAY 04/26/14  Yes Mosie Lukes, MD  atorvastatin (LIPITOR) 20 MG tablet Take 1 tablet (20 mg total) by mouth daily. 07/15/14  Yes Larey Dresser, MD  carvedilol (COREG) 25 MG tablet TAKE 1 TABLET TWICE DAILY  WITH  A  MEAL. 04/26/14  Yes Shaune Pascal Bensimhon, MD  enalapril (VASOTEC) 2.5 MG tablet TAKE 1 TABLET TWICE DAILY. 04/26/14  Yes Jolaine Artist, MD  escitalopram (LEXAPRO) 10 MG tablet TAKE 1 TABLET EVERY DAY 07/27/14  Yes Mosie Lukes, MD  fenofibrate (TRICOR) 48 MG tablet Take 1 tablet (48 mg total) by mouth daily. 07/15/14  Yes Larey Dresser, MD  fluticasone [facility]) 50 MCG/ACT nasal spray Place 2 sprays into both nostrils daily as needed for allergies or rhinitis. As needed for nasal stuffiness. 01/13/14  Yes Mosie Lukes, MD  gabapentin (NEURONTIN) 300 MG capsule TAKE 1 CAPSULE TWICE DAILY 07/28/14  Yes Mosie Lukes, MD  glucose blood (FREESTYLE LITE) test strip DX: 250.60  Check sugars bid and as needed 05/22/13  Yes Mosie Lukes, MD  insulin NPH (HUMULIN N,NOVOLIN N) 100 UNIT/ML injection Inject 20-45 Units into the skin 2 (two) times daily before a meal. Take 45 units in the morning and 20 units in the evening   Yes Historical Provider, MD  insulin regular (NOVOLIN R,HUMULIN R) 100 units/mL injection Change to 15 units three times a with meals (to cover all meals--this is the same total amount but spread out) Patient taking differently: Inject 15 Units into the skin 2 (two) times daily before a meal. Change to 15 units three times a with meals (to cover all meals--this is the same total amount but spread out) 04/01/13  Yes Ivan Anchors Love, PA-C  levothyroxine (SYNTHROID, LEVOTHROID) 25 MCG tablet TAKE 1 TABLET EVERY DAY BEFORE BREAKFAST 04/26/14  Yes Mosie Lukes, MD  LORazepam (ATIVAN) 0.5 MG tablet TAKE ONE TABLET  BY MOUTH TWICE DAILY AS NEEDED FOR ANXIETY OR SLEEP 07/14/14  Yes Mosie Lukes, MD  Omega-3 Fatty Acids ([employer] FISH OIL) 1000 MG CAPS Take 2 capsules by mouth daily. 07/15/14  Yes Larey Dresser, MD  torsemide (DEMADEX) 20 MG tablet Take 2 tablets (40 mg total) by mouth once. may take an extra tab for 3# weight gain, swelling or SOB 07/15/14  Yes Larey Dresser, MD  Vitamin D, Ergocalciferol, (DRISDOL) 50000 UNITS CAPS capsule  Take 50,000 Units by mouth every 7 (seven) days.   Yes Historical Provider, MD  warfarin (COUMADIN) 5 MG tablet Take 7.5 mg by mouth daily at 6 PM.   Yes Historical Provider, MD  NON FORMULARY Oxygen---24 hour    Historical Provider, MD  traMADol (ULTRAM) 50 MG tablet Take 1 tablet (50 mg total) by mouth every 12 (twelve) hours as needed. Patient not taking: Reported on 07/16/2014    Mosie Lukes, MD   BP 112/49 mmHg  Pulse 101  Temp(Src) 99.1 F (37.3 C) (Oral)  Resp 18  Ht 5\' 4"  (1.626 m)  Wt 262 lb (118.842 kg)  BMI 44.95 kg/m2  SpO2 92% Physical Exam  Constitutional: She appears well-developed and well-nourished. She appears distressed (moderately distress, speaking in short phrases ).  HENT:  Head: Normocephalic and atraumatic.  Mouth/Throat: Oropharynx is clear and moist. No oropharyngeal exudate.  Eyes: EOM are normal. Pupils are equal, round, and reactive to light.  Neck: Normal range of motion. Neck supple. JVD present.  Cardiovascular: Normal rate, regular rhythm and normal heart sounds.  Exam reveals no gallop and no friction rub.   No murmur heard. Pulmonary/Chest: She is in respiratory distress (moderate, visibly short of breath). She has decreased breath sounds (at bases). She has wheezes (mild). She has rhonchi (bases). She has no rales.  Abdominal: Soft. She exhibits no distension. There is no tenderness.  Musculoskeletal: Normal range of motion. She exhibits edema (1+ bilaterally to knees).  Skin: Skin is warm and dry. No rash noted. She is not  diaphoretic.  Nursing note and vitals reviewed.   ED Course  Procedures (including critical care time) Labs Review Labs Reviewed  CBC - Abnormal; Notable for the following:    WBC 14.7 (*)    All other components within normal limits  BASIC METABOLIC PANEL - Abnormal; Notable for the following:    Glucose, Bld 107 (*)    BUN 43 (*)    Creatinine, Ser 2.02 (*)    GFR calc non Af Amer 24 (*)    GFR calc Af Amer 27 (*)    All other components within normal limits  BRAIN NATRIURETIC PEPTIDE - Abnormal; Notable for the following:    B Natriuretic Peptide 353.9 (*)    All other components within normal limits  PROTIME-INR - Abnormal; Notable for the following:    Prothrombin Time 40.0 (*)    INR 4.10 (*)    All other components within normal limits  D-DIMER, QUANTITATIVE  Randolm Idol, ED    Imaging Review Dg Chest Port 1 View  07/28/2014   CLINICAL DATA:  74 year old female with a history of shortness of breath for 4 days.  EXAM: PORTABLE CHEST - 1 VIEW  COMPARISON:  11/30/2013  FINDINGS: Cardiomediastinal silhouette likely unchanged in size and contour, partially screw by overlying lung and pleural disease.  Fullness in the central vasculature.  Surgical changes of prior median sternotomy and CABG.  Unchanged position of cardiac pacing device on left chest wall, with right ventricular, right atrial, and coronary sinus leads.  Mixed interstitial and airspace disease, with interlobular septal thickening. Lung bases not well evaluated.  No evidence of displaced fracture, with similar appearance of posttraumatic deformity of the right chest wall.  IMPRESSION: Evidence of congestive heart failure and pulmonary edema. Small pleural effusions cannot be excluded.  Unchanged position of cardiac pacing device/ AICD.  Signed,  Dulcy Fanny. Earleen Newport, DO  [facility]  [facility]  Electronically Signed   By: Corrie Mckusick D.O.   On: 07/28/2014 21:31      EKG Interpretation None      MDM   Final diagnoses:  Acute exacerbation of congestive heart failure   74 year old female with a past medical history of congestive heart failure requiring chronic oxygen of 2 L as well as coronary artery disease, chronic kidney disease, morbid obesity, diabetes presents with increasing shortness of breath over the last week. Dyspnea is worse with exertion as well as endorses increased orthopnea since yesterday. She is visibly short of breath on my exam and speaking in short phrases. She normally wears 2 L of home oxygen and that is increased to 5 L. She does improve with oxygen. She denies any chest pain or pain otherwise. She denies any cough. She is also tachypnea.  On arrival she is tachypneic, tachycardic, hypoxic. Presentation concerning for possible volume overload related to her heart failure versus underlying pneumonia despite the fact that she does not have a productive cough. She is therapeutic on her Coumadin making pulmonary embolus less likely, especially given the lack of lower extremities swelling or previous DVT. Point of care troponin negative. Chest x-ray, basic labs, BNP, d-dimer, troponin sent.  Troponin negative. BNP is elevated from earlier today at 360. INR also elevated at 4.1 but no evidence of bleeding. Chest x-ray is consistent with physical exam revealing volume overload. I discussed the patient with cardiology who will evaluate the patient and recommended medical admission. She will be admitted to the hospitalist.  Larence Penning, MD 07/28/14 [place], MD 07/30/14 1416

## 2014-07-28 NOTE — ED Provider Notes (Signed)
ED ECG REPORT   Date: 07/28/2014  Rate: 118  Rhythm: sinus tachycardia with pacs and pvcs versus afib  QRS Axis: normal  Intervals: indeterminate  ST/T Wave abnormalities: nonspecific ST/T changes  Conduction Disutrbances:left bundle branch block  Narrative Interpretation:   Old EKG Reviewed: prior ekgs were paced rhythm  EKG not available in epic for interpretation into muse  Alfonzo Beers, MD 07/29/14 1636

## 2014-07-28 NOTE — Consult Note (Signed)
Reason for Consult: shortness of breath Primary Cardiologist: Dr. Aundra Dubin Referring Physician: Dr. Maggie Schwalbe Destiny [name] is an 74 y.o. female.  HPI: Destiny [name] is a 74 yo woman with PMH of CAD s/p CABG '03, systolic/diastolic heart failure with [employer]es BIVICD and last known EF 4/15 with EF 40%, dyslipidemia, chronic back pain, T2DM, atrial fibrillation and OSA on CPAP who actually was seen in clinic with Dr. Aundra Dubin on 3/03 who presents with a few days of dyspnea on exertion/shortness of breath. She's had some subjective fevers at home. She thinks her symptoms started shortly after her new triglyceride medication was started approximately 1 month ago. She recently saw Dr. Aundra Dubin and she's briefly took a higher dose of diuretics with good results;however, she gets weak when she losing to much water weight. She is up another 3-4 lbs now and has had worse breathing leading to presentation.           Past Medical History  Diagnosis Date  . LBBB (left bundle branch block)   . Carotid stenosis     a. 10/19/2011 carotid duplex - Mild hard plaque bilaterally. Stable 40-59% bilateral ICA stenosis. Carotid US (04/2013):  Bilateral 40-59% ICA.  F/u 1 year  . Dyslipidemia   . Hypertension   . Chronic combined systolic and diastolic CHF (congestive heart failure)     a. EF as low as 25% in 2006;  b. EF 60-65% in 12/2011;  c. 02/2013 Echo: EF 35-40, mod mid-dist antsept HK, Gr 2 DD, mild LVH.  . Back pain, chronic     "just when I walk; mass on 3rd and 4th vertebrae right lower back"  . Cardiomyopathy, ischemic     a. 2012 s/p SJM 3231-40 Uni BiV ICD, ser # [id number].  Marland Kitchen CAD (coronary artery disease)     a. 05/2001 CABG x4: LIMA->LAD, VG->D1, VG->D2, VG->RCA.  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 CKD (chronic kidney disease), stage III   . Biventricular implantable cardiac defibrillator in situ 2007, 2012    a. 2007;  b. 2012 Gen change: SJM 3231-40 Uni  BiV ICD, ser # [id number].  Marland Kitchen Hypoxemia requiring supplemental oxygen   . Candidiasis of vulva and vagina   . Hypothyroidism   . 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 Benay Spice, in remission  . History of pneumonia 12/27/11    "3 times; it's been a long time ago"  . OSA on CPAP   . Gout ~ 08/2011  . PAF (paroxysmal atrial fibrillation)     on Coumadin  . DM (diabetes mellitus), type 2 with complications     insulin dependent, retinopathy, neuropathy  . Mural thrombus of left ventricle     before 2003, while on Coumadin, No h/o CVA  . Morbid obesity with BMI of 45.0-49.9, adult     Ht. 5'4". BMI 47.2  . Vitamin D deficiency 06/09/2012    historical  . Olecranon bursitis of right elbow 12/29/2012  . History of recurrent UTIs   . Diabetes 05/24/2013  . Hypercalcemia 09/26/2013  . Anemia, unspecified 12/13/2013  . Renal insufficiency 03/14/2014    Following with [facility], Dr Loyal Buba    Past Surgical History  Procedure Laterality Date  . Cardiac catheterization  06/03/01  . Cholecystectomy  1970  . Tubal ligation  1972  . Abdominal hysterectomy  1982  . Insert / replace / remove pacemaker  2007;  2012    w/AICD  . Coronary artery bypass graft  2003    CABG X4  . Cataract extraction      left eye    Family History  Problem Relation Age of Onset  . Kidney cancer Mother     kidney and female repo - died @ 32  . Heart disease Father     died @ 66  . Asthma Mother   . Stroke Father     Social History:  reports that she has never smoked. She has never used smokeless tobacco. She reports that she does not drink alcohol or use illicit drugs.  Allergies:  Allergies  Allergen Reactions  . Sulfonamide Derivatives Other (See Comments)    Unknown; "childhood allergy/mother"    Medications: I have reviewed the patient's current medications. Prior to Admission:  (Not in a hospital admission) Scheduled:  Results for orders placed or  performed during the hospital encounter of 07/28/14 (from the past 48 hour(s))  CBC     Status: Abnormal   Collection Time: 07/28/14  8:48 PM  Result Value Ref Range   WBC 14.7 (H) 4.0 - 10.5 K/uL   RBC 4.05 3.87 - 5.11 MIL/uL   Hemoglobin 12.0 12.0 - 15.0 g/dL   HCT 36.6 36.0 - 46.0 %   MCV 90.4 78.0 - 100.0 fL   MCH 29.6 26.0 - 34.0 pg   MCHC 32.8 30.0 - 36.0 g/dL   RDW 14.9 11.5 - 15.5 %   Platelets 219 150 - 400 K/uL  Basic metabolic panel     Status: Abnormal   Collection Time: 07/28/14  8:48 PM  Result Value Ref Range   Sodium 139 135 - 145 mmol/L   Potassium 4.7 3.5 - 5.1 mmol/L   Chloride 104 96 - 112 mmol/L   CO2 25 19 - 32 mmol/L   Glucose, Bld 107 (H) 70 - 99 mg/dL   BUN 43 (H) 6 - 23 mg/dL   Creatinine, Ser 2.02 (H) 0.50 - 1.10 mg/dL   Calcium 8.7 8.4 - 10.5 mg/dL   GFR calc non Af Amer 24 (L) >90 mL/min   GFR calc Af Amer 27 (L) >90 mL/min    Comment: (NOTE) The eGFR has been calculated using the CKD EPI equation. This calculation has not been validated in all clinical situations. eGFR's persistently <90 mL/min signify possible Chronic Kidney Disease.    Anion gap 10 5 - 15  BNP (order ONLY if patient complains of dyspnea/SOB AND you have documented it for THIS visit)     Status: Abnormal   Collection Time: 07/28/14  8:48 PM  Result Value Ref Range   B Natriuretic Peptide 353.9 (H) 0.0 - 100.0 pg/mL  D-dimer, quantitative     Status: None   Collection Time: 07/28/14  8:48 PM  Result Value Ref Range   D-Dimer, Quant 0.31 0.00 - 0.48 ug/mL-FEU    Comment:        AT THE INHOUSE ESTABLISHED CUTOFF VALUE OF 0.48 ug/mL FEU, THIS ASSAY HAS BEEN DOCUMENTED IN THE LITERATURE TO HAVE A SENSITIVITY AND NEGATIVE PREDICTIVE VALUE OF AT LEAST 98 TO 99%.  THE TEST RESULT SHOULD BE CORRELATED WITH AN ASSESSMENT OF THE CLINICAL PROBABILITY OF DVT / VTE.   Protime-INR (if pt is taking Coumadin)     Status: Abnormal   Collection Time: 07/28/14  8:48 PM  Result Value  Ref Range   Prothrombin Time 40.0 (H) 11.6 - 15.2 seconds   INR 4.10 (  H) 0.00 - 1.49  I-stat troponin, ED (not at [facility])     Status: None   Collection Time: 07/28/14  8:56 PM  Result Value Ref Range   Troponin i, poc 0.02 0.00 - 0.08 ng/mL   Comment 3            Comment: Due to the release kinetics of cTnI, a negative result within the first hours of the onset of symptoms does not rule out myocardial infarction with certainty. If myocardial infarction is still suspected, repeat the test at appropriate intervals.     Dg Chest Port 1 View  07/28/2014   CLINICAL DATA:  74 year old female with a history of shortness of breath for 4 days.  EXAM: PORTABLE CHEST - 1 VIEW  COMPARISON:  11/30/2013  FINDINGS: Cardiomediastinal silhouette likely unchanged in size and contour, partially screw by overlying lung and pleural disease.  Fullness in the central vasculature.  Surgical changes of prior median sternotomy and CABG.  Unchanged position of cardiac pacing device on left chest wall, with right ventricular, right atrial, and coronary sinus leads.  Mixed interstitial and airspace disease, with interlobular septal thickening. Lung bases not well evaluated.  No evidence of displaced fracture, with similar appearance of posttraumatic deformity of the right chest wall.  IMPRESSION: Evidence of congestive heart failure and pulmonary edema. Small pleural effusions cannot be excluded.  Unchanged position of cardiac pacing device/ AICD.  Signed,  Dulcy Fanny. Earleen Newport, DO  [facility]  [facility]   Electronically Signed   By: Corrie Mckusick D.O.   On: 07/28/2014 21:31    Review of Systems  Constitutional: Positive for fever, chills and malaise/fatigue.  HENT: Negative for ear pain.   Eyes: Negative for double vision and photophobia.  Respiratory: Positive for shortness of breath. Negative for hemoptysis and sputum production.   Cardiovascular: Positive for orthopnea, leg  swelling and PND. Negative for chest pain.  Gastrointestinal: Negative for nausea and vomiting.  Genitourinary: Negative for dysuria and hematuria.  Musculoskeletal: Negative for myalgias and neck pain.  Skin: Negative for rash.  Neurological: Negative for tingling, tremors, sensory change and headaches.  Endo/Heme/Allergies: Negative for polydipsia. Bruises/bleeds easily.  Psychiatric/Behavioral: Negative for suicidal ideas and substance abuse.   Blood pressure 112/49, pulse 101, temperature 99.1 F (37.3 C), temperature source Oral, resp. rate 18, height $RemoveBe'5\' 4"'VPNwRxPAG$  (1.626 m), weight 118.842 kg (262 lb), SpO2 92 %. Physical Exam  Nursing note and vitals reviewed. Constitutional: She is oriented to person, place, and time. She appears well-developed and well-nourished. She appears distressed.  HENT:  Head: Normocephalic and atraumatic.  Nose: Nose normal.  Mouth/Throat: Oropharynx is clear and moist. No oropharyngeal exudate.  Eyes: Conjunctivae and EOM are normal. Pupils are equal, round, and reactive to light. No scleral icterus.  Neck: Normal range of motion. Neck supple. JVD present. No tracheal deviation present.  JVP to midneck with HJR  Cardiovascular: Intact distal pulses.  Exam reveals no gallop.   No murmur heard. Irregularly irregular  Respiratory: Effort normal. No respiratory distress. She has no wheezes. She has rales.  GI: Soft. Bowel sounds are normal. She exhibits no distension. There is no tenderness. There is no rebound.  Musculoskeletal: Normal range of motion. She exhibits edema.  Trace edema bilaterally  Neurological: She is alert and oriented to person, place, and time. No cranial nerve deficit. Coordination normal.  Skin: Skin is warm and dry. No rash noted. She is not diaphoretic. No erythema.  Psychiatric: She  has a normal mood and affect. Her behavior is normal. Thought content normal.    Labs reviewed; wbc 14.7, h/h 12/36, plt 200s, na 139, K 4.7, bun/cr  43/2.02, BNP 300s, INR 4.1 Trop 0.02, d-dimer 0.31  Reviewed results of 4/15Echo: EF 40%, diffuse hypokinesis, pseudonormal LV filling, grade II DD, calcified mitral annulus, mildly decreased RV function Assessment/Plan: Problem List Shortness of breath/exertional dyspnea Acute on chronic systolic and diastolic heart failure Acute on chronic renal failure Elevated INR (4.1) Known CAD Atrial fibrillation   Destiny [name] is a 73 yo woman with PMH of CAD s/p CABG '03, systolic/diastolic heart failure with [employer]. Judes BIVICD and last known EF 4/15 with EF 40%, dyslipidemia, chronic back pain, T2DM, atrial fibrillation and OSA on CPAP who presents with increased shortness of breath. Differential diagnosis for shortness of breath includes includes pneumonia, COPD exacerbation, acute bronchitis, deconditioning, anginal equivalent, heart failure - systolic and/or diastolic among other etiologies. She appears to have mild HF exacerbation, hemodynamically stable and warm. Will continue home therapy and assess response to IV lasix in the ER. (repeat dose of 100 mg).  - hold warfarin given supratherapeutic level - interrogate St. Jude's device in AM - assess response to determine need for home torsemide dose (was on 40 mg once daily) goal negative 1-2  - continue home coreg, enalapril 2.5 mg bid, no spironolactone as stated in clinic given elevated K previously - continue CPAP for OHS/OSA - give daily asa 81 mg while holding warfarin - start heparin for INR < 2 - continue atorvastatin 20 mg daily   Benito Lemmerman 07/28/2014, 10:57 PM

## 2014-07-28 NOTE — ED Notes (Addendum)
Dr. Eulis Foster at triage to assess pt regarding abnormal EKG.  No STEMI activation at this time per Dr. Eulis Foster.

## 2014-07-28 NOTE — ED Notes (Signed)
Increased sob with exertion since Sunday.  SOB worse today.  Speaking in short phrases.  Normally wears 2 liters home O2 via [place].  Family member states he has turned oxygen up to 5 liters (and continuous- highest setting) today without any improvements.  Pt denies pain.

## 2014-07-28 NOTE — H&P (Signed)
[facility] History and Physical  Destiny [name] QQI:[id number] DOB: 07-Aug-1940 DOA: 07/28/2014  Referring physician: ER physician. PCP: Penni Homans, MD  Chief Complaint: Shortness of breath.  HPI: Destiny [name] is a 74 y.o. female with history of CAD status post CABG, paroxysmal atrial fibrillation, chronic systolic and diastolic heart failure last EF measured in April 2015 was 40%, sleep apnea, diabetes mellitus presents to the ER because of shortness of breath. Patient stated that she has been short of breath over the last 1 week gradually worsening. Patient shortness of breath increases on exertion and was unable to lie flat. Denies any productive cough fever and chills. Patient states patient's cardiologist has recently increased her diuretic but after taking which patient felt weak and she went back to her usual dose. Chest x-ray shows features concerning for CHF and on-call cardiologist was consulted and patient was given Lasix 100 mg IV and admitted for further management. In addition patient is found to be mildly febrile with leukocytosis. Patient denies any chest pain.    Review of Systems: As presented in the history of presenting illness, rest negative.  Past Medical History  Diagnosis Date  . LBBB (left bundle branch block)   . Carotid stenosis     a. 10/19/2011 carotid duplex - Mild hard plaque bilaterally. Stable 40-59% bilateral ICA stenosis. Carotid US (04/2013):  Bilateral 40-59% ICA.  F/u 1 year  . Dyslipidemia   . Hypertension   . Chronic combined systolic and diastolic CHF (congestive heart failure)     a. EF as low as 25% in 2006;  b. EF 60-65% in 12/2011;  c. 02/2013 Echo: EF 35-40, mod mid-dist antsept HK, Gr 2 DD, mild LVH.  . Back pain, chronic     "just when I walk; mass on 3rd and 4th vertebrae right lower back"  . Cardiomyopathy, ischemic     a. 2012 s/p SJM 3231-40 Uni BiV ICD, ser # [id number].  Marland Kitchen CAD (coronary artery disease)     a. 05/2001 CABG x4:  LIMA->LAD, VG->D1, VG->D2, VG->RCA.  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 CKD (chronic kidney disease), stage III   . Biventricular implantable cardiac defibrillator in situ 2007, 2012    a. 2007;  b. 2012 Gen change: SJM 3231-40 Uni BiV ICD, ser # [id number].  Marland Kitchen Hypoxemia requiring supplemental oxygen   . Candidiasis of vulva and vagina   . Hypothyroidism   . 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 Benay Spice, in remission  . History of pneumonia 12/27/11    "3 times; it's been a long time ago"  . OSA on CPAP   . Gout ~ 08/2011  . PAF (paroxysmal atrial fibrillation)     on Coumadin  . DM (diabetes mellitus), type 2 with complications     insulin dependent, retinopathy, neuropathy  . Mural thrombus of left ventricle     before 2003, while on Coumadin, No h/o CVA  . Morbid obesity with BMI of 45.0-49.9, adult     Ht. 5'4". BMI 47.2  . Vitamin D deficiency 06/09/2012    historical  . Olecranon bursitis of right elbow 12/29/2012  . History of recurrent UTIs   . Diabetes 05/24/2013  . Hypercalcemia 09/26/2013  . Anemia, unspecified 12/13/2013  . Renal insufficiency 03/14/2014    Following with [facility], Dr Loyal Buba   Past Surgical History  Procedure Laterality Date  . Cardiac catheterization  06/03/01  . Cholecystectomy  1970  . Tubal ligation  1972  . Abdominal hysterectomy  1982  . Insert / replace / remove pacemaker  2007; 2012    w/AICD  . Coronary artery bypass graft  2003    CABG X4  . Cataract extraction      left eye   Social History:  reports that she has never smoked. She has never used smokeless tobacco. She reports that she does not drink alcohol or use illicit drugs. Where does patient livhome. Can patient participate in [facility].  Allergies  Allergen Reactions  . Sulfonamide Derivatives Other (See Comments)    Unknown; "childhood allergy/mother"    Family History:  Family History  Problem  Relation Age of Onset  . Kidney cancer Mother     kidney and female repo - died @ 18  . Heart disease Father     died @ 63  . Asthma Mother   . Stroke Father       Prior to Admission medications   Medication Sig Start Date End Date Taking? Authorizing Provider  albuterol (PROVENTIL HFA;VENTOLIN HFA) 108 (90 BASE) MCG/ACT inhaler Inhale 2 puffs into the lungs every 6 (six) hours as needed for wheezing or shortness of breath. 12/26/12  Yes Mosie Lukes, MD  amLODipine (NORVASC) 5 MG tablet TAKE 1 TABLET EVERY DAY 04/26/14  Yes Mosie Lukes, MD  atorvastatin (LIPITOR) 20 MG tablet Take 1 tablet (20 mg total) by mouth daily. 07/15/14  Yes Larey Dresser, MD  carvedilol (COREG) 25 MG tablet TAKE 1 TABLET TWICE DAILY  WITH  A  MEAL. 04/26/14  Yes Shaune Pascal Bensimhon, MD  enalapril (VASOTEC) 2.5 MG tablet TAKE 1 TABLET TWICE DAILY. 04/26/14  Yes Jolaine Artist, MD  escitalopram (LEXAPRO) 10 MG tablet TAKE 1 TABLET EVERY DAY 07/27/14  Yes Mosie Lukes, MD  fenofibrate (TRICOR) 48 MG tablet Take 1 tablet (48 mg total) by mouth daily. 07/15/14  Yes Larey Dresser, MD  fluticasone [facility]) 50 MCG/ACT nasal spray Place 2 sprays into both nostrils daily as needed for allergies or rhinitis. As needed for nasal stuffiness. 01/13/14  Yes Mosie Lukes, MD  gabapentin (NEURONTIN) 300 MG capsule TAKE 1 CAPSULE TWICE DAILY 07/28/14  Yes Mosie Lukes, MD  glucose blood (FREESTYLE LITE) test strip DX: 250.60  Check sugars bid and as needed 05/22/13  Yes Mosie Lukes, MD  insulin NPH (HUMULIN N,NOVOLIN N) 100 UNIT/ML injection Inject 20-45 Units into the skin 2 (two) times daily before a meal. Take 45 units in the morning and 20 units in the evening   Yes Historical Provider, MD  insulin regular (NOVOLIN R,HUMULIN R) 100 units/mL injection Change to 15 units three times a with meals (to cover all meals--this is the same total amount but spread out) Patient taking differently: Inject 15 Units into the skin 2  (two) times daily before a meal. Change to 15 units three times a with meals (to cover all meals--this is the same total amount but spread out) 04/01/13  Yes Ivan Anchors Love, PA-C  levothyroxine (SYNTHROID, LEVOTHROID) 25 MCG tablet TAKE 1 TABLET EVERY DAY BEFORE BREAKFAST 04/26/14  Yes Mosie Lukes, MD  LORazepam (ATIVAN) 0.5 MG tablet TAKE ONE TABLET BY MOUTH TWICE DAILY AS NEEDED FOR ANXIETY OR SLEEP 07/14/14  Yes Mosie Lukes, MD  Omega-3 Fatty Acids ([employer] FISH OIL) 1000 MG CAPS Take 2 capsules by mouth daily. 07/15/14  Yes Elby Showers  Aundra Dubin, MD  torsemide (DEMADEX) 20 MG tablet Take 2 tablets (40 mg total) by mouth once. may take an extra tab for 3# weight gain, swelling or SOB 07/15/14  Yes Larey Dresser, MD  Vitamin D, Ergocalciferol, (DRISDOL) 50000 UNITS CAPS capsule Take 50,000 Units by mouth every 7 (seven) days.   Yes Historical Provider, MD  warfarin (COUMADIN) 5 MG tablet Take 7.5 mg by mouth daily at 6 PM.   Yes Historical Provider, MD  NON FORMULARY Oxygen---24 hour    Historical Provider, MD  traMADol (ULTRAM) 50 MG tablet Take 1 tablet (50 mg total) by mouth every 12 (twelve) hours as needed. Patient not taking: Reported on 07/16/2014    Mosie Lukes, MD    Physical Exam: Filed Vitals:   07/28/14 2200 07/28/14 2215 07/28/14 2245 07/28/14 2330  BP: 113/45 112/49 107/84 108/47  Pulse: 106 101 91 85  Temp:      TempSrc:      Resp: 19 18 21 21   Height:      Weight:      SpO2: 92% 92% 94% 92%     General:  Obese mildly short of breath.  Eyes: Anicteric no pallor.  ENT: No discharge from the ears eyes nose or mouth.  Neck: No mass felt. JVD elevated.  Cardiovascular: S1 and S2 heard.  Respiratory: No rhonchi or crepitations.  Abdomen: Soft nontender bowel sounds present.  Skin: No rash.  Musculoskeletal: No edema.  Psychiatric: Appears normal.  Neurologic: Alert and awake oriented to time place and person. Moves all extremities.  Labs on Admission:  Basic  Metabolic Panel:  Recent Labs Lab 07/28/14 1000 07/28/14 2048  NA 139 139  K 4.8 4.7  CL 104 104  CO2 28 25  GLUCOSE 116* 107*  BUN 44* 43*  CREATININE 2.15* 2.02*  CALCIUM 8.8 8.7   Liver Function Tests: No results for input(s): AST, ALT, ALKPHOS, BILITOT, PROT, ALBUMIN in the last 168 hours. No results for input(s): LIPASE, AMYLASE in the last 168 hours. No results for input(s): AMMONIA in the last 168 hours. CBC:  Recent Labs Lab 07/28/14 2048  WBC 14.7*  HGB 12.0  HCT 36.6  MCV 90.4  PLT 219   Cardiac Enzymes: No results for input(s): CKTOTAL, CKMB, CKMBINDEX, TROPONINI in the last 168 hours.  BNP (last 3 results)  Recent Labs  07/15/14 1025 07/28/14 1000 07/28/14 2048  BNP 188.8* 171.5* 353.9*    ProBNP (last 3 results)  Recent Labs  09/07/13 1050  PROBNP 983.0*    CBG: No results for input(s): GLUCAP in the last 168 hours.  Radiological Exams on Admission: Dg Chest Port 1 View  07/28/2014   CLINICAL DATA:  74 year old female with a history of shortness of breath for 4 days.  EXAM: PORTABLE CHEST - 1 VIEW  COMPARISON:  11/30/2013  FINDINGS: Cardiomediastinal silhouette likely unchanged in size and contour, partially screw by overlying lung and pleural disease.  Fullness in the central vasculature.  Surgical changes of prior median sternotomy and CABG.  Unchanged position of cardiac pacing device on left chest wall, with right ventricular, right atrial, and coronary sinus leads.  Mixed interstitial and airspace disease, with interlobular septal thickening. Lung bases not well evaluated.  No evidence of displaced fracture, with similar appearance of posttraumatic deformity of the right chest wall.  IMPRESSION: Evidence of congestive heart failure and pulmonary edema. Small pleural effusions cannot be excluded.  Unchanged position of cardiac pacing device/ AICD.  Signed,  Dulcy Fanny. Earleen Newport, DO  [facility]  [facility]  Radiology   Electronically Signed   By: Corrie Mckusick D.O.   On: 07/28/2014 21:31    EKG: Independently reviewed. A. fib with mildly elevated heart rate.  Assessment/Plan Principal Problem:   CHF exacerbation Active Problems:   Essential hypertension   ATRIAL FIBRILLATION, PAROXYSMAL   OSA (obstructive sleep apnea)   CKD (chronic kidney disease) stage 3, GFR 30-59 ml/min   Hypothyroidism   Diabetes mellitus type 2, controlled   CHF (congestive heart failure)   1. Acute on chronic combined CHF last EF measured was 40% - patient received a total of 140 mg Lasix IV in the ER. I have placed patient on 40 mg IV every 8 hourly. Closely follow intake and output metabolic panel and daily weights. Patient is on lisinopril. 2. Atrial fibrillation - initial rate is mildly elevated. Chads 2 vasc score is more than 2. Patient presently is on heparin infusion. 3. Fever - I have placed patient on doxycycline and check influenza PCR. 4. Chronic kidney disease stage III - closely follow metabolic panel. 5. Diabetes mellitus type 2 - continue home medications with sliding scale coverage. 6. Hypothyroidism Synthroid. 7. Sleep apnea on CPAP.   DVT Prophylaxis heparin infusion.  Code Status: Full code.  Family Communication: None.  Disposition Plan: Admit to inpatient.    Mahonri Seiden N. [facility] Pager [phone].  If 7PM-7AM, please contact night-coverage [email or web address] Password [facility] 07/28/2014, 11:51 PM

## 2014-07-29 DIAGNOSIS — N183 Chronic kidney disease, stage 3 (moderate): Secondary | ICD-10-CM

## 2014-07-29 DIAGNOSIS — J9621 Acute and chronic respiratory failure with hypoxia: Secondary | ICD-10-CM

## 2014-07-29 DIAGNOSIS — I482 Chronic atrial fibrillation: Secondary | ICD-10-CM

## 2014-07-29 DIAGNOSIS — D689 Coagulation defect, unspecified: Secondary | ICD-10-CM

## 2014-07-29 LAB — CBC WITH DIFFERENTIAL/PLATELET
BASOS PCT: 0 % (ref 0–1)
Basophils Absolute: 0 10*3/uL (ref 0.0–0.1)
EOS ABS: 0.1 10*3/uL (ref 0.0–0.7)
Eosinophils Relative: 1 % (ref 0–5)
HCT: 35.4 % — ABNORMAL LOW (ref 36.0–46.0)
Hemoglobin: 11.5 g/dL — ABNORMAL LOW (ref 12.0–15.0)
LYMPHS ABS: 2.4 10*3/uL (ref 0.7–4.0)
LYMPHS PCT: 24 % (ref 12–46)
MCH: 29.8 pg (ref 26.0–34.0)
MCHC: 32.5 g/dL (ref 30.0–36.0)
MCV: 91.7 fL (ref 78.0–100.0)
Monocytes Absolute: 1 10*3/uL (ref 0.1–1.0)
Monocytes Relative: 10 % (ref 3–12)
NEUTROS ABS: 6.7 10*3/uL (ref 1.7–7.7)
NEUTROS PCT: 65 % (ref 43–77)
Platelets: 182 10*3/uL (ref 150–400)
RBC: 3.86 MIL/uL — ABNORMAL LOW (ref 3.87–5.11)
RDW: 15 % (ref 11.5–15.5)
WBC: 10.2 10*3/uL (ref 4.0–10.5)

## 2014-07-29 LAB — COMPREHENSIVE METABOLIC PANEL
ALT: 12 U/L (ref 0–35)
ANION GAP: 10 (ref 5–15)
AST: 14 U/L (ref 0–37)
Albumin: 3.4 g/dL — ABNORMAL LOW (ref 3.5–5.2)
Alkaline Phosphatase: 57 U/L (ref 39–117)
BUN: 44 mg/dL — ABNORMAL HIGH (ref 6–23)
CALCIUM: 8.8 mg/dL (ref 8.4–10.5)
CO2: 26 mmol/L (ref 19–32)
Chloride: 102 mmol/L (ref 96–112)
Creatinine, Ser: 2.14 mg/dL — ABNORMAL HIGH (ref 0.50–1.10)
GFR calc Af Amer: 26 mL/min — ABNORMAL LOW (ref >90)
GFR calc non Af Amer: 22 mL/min — ABNORMAL LOW (ref >90)
Glucose, Bld: 183 mg/dL — ABNORMAL HIGH (ref 70–99)
Potassium: 4.6 mmol/L (ref 3.5–5.1)
Sodium: 138 mmol/L (ref 135–145)
Total Bilirubin: 0.8 mg/dL (ref 0.3–1.2)
Total Protein: 6.7 g/dL (ref 6.0–8.3)

## 2014-07-29 LAB — GLUCOSE, CAPILLARY
GLUCOSE-CAPILLARY: 129 mg/dL — AB (ref 70–99)
Glucose-Capillary: 181 mg/dL — ABNORMAL HIGH (ref 70–99)
Glucose-Capillary: 193 mg/dL — ABNORMAL HIGH (ref 70–99)
Glucose-Capillary: 169 mg/dL — ABNORMAL HIGH (ref 70–99)

## 2014-07-29 LAB — PROTIME-INR
INR: 3.87 — ABNORMAL HIGH (ref 0.00–1.49)
Prothrombin Time: 38.3 seconds — ABNORMAL HIGH (ref 11.6–15.2)

## 2014-07-29 LAB — INFLUENZA PANEL BY PCR (TYPE A & B)
H1N1 flu by pcr: NOT DETECTED
INFLAPCR: NEGATIVE
Influenza B By PCR: NEGATIVE

## 2014-07-29 LAB — TSH: TSH: 1.558 u[IU]/mL (ref 0.350–4.500)

## 2014-07-29 LAB — TROPONIN I: TROPONIN I: 0.03 ng/mL (ref <0.031)

## 2014-07-29 MED ORDER — ATORVASTATIN CALCIUM 20 MG PO TABS
20.0000 mg | ORAL_TABLET | Freq: Every day | ORAL | Status: DC
  Administered 2014-07-29 – 2014-07-31 (×3): 20 mg via ORAL
  Filled 2014-07-29 (×3): qty 1

## 2014-07-29 MED ORDER — ONDANSETRON HCL 4 MG/2ML IJ SOLN: 4.0000 mg | Freq: Three times a day (TID) | INTRAMUSCULAR | Status: DC | PRN

## 2014-07-29 MED ORDER — SODIUM CHLORIDE 0.9 % IJ SOLN
3.0000 mL | Freq: Two times a day (BID) | INTRAMUSCULAR | Status: DC
  Administered 2014-07-29 – 2014-07-31 (×6): 3 mL via INTRAVENOUS

## 2014-07-29 MED ORDER — ONDANSETRON HCL 4 MG PO TABS: 4.0000 mg | ORAL_TABLET | Freq: Four times a day (QID) | ORAL | Status: DC | PRN

## 2014-07-29 MED ORDER — SALINE SPRAY 0.65 % NA SOLN
1.0000 | NASAL | Status: DC | PRN
  Administered 2014-07-29 – 2014-07-30 (×2): 1 via NASAL
  Filled 2014-07-29 (×2): qty 44

## 2014-07-29 MED ORDER — FUROSEMIDE 10 MG/ML IJ SOLN
80.0000 mg | Freq: Two times a day (BID) | INTRAMUSCULAR | Status: DC
  Administered 2014-07-29 – 2014-07-31 (×4): 80 mg via INTRAVENOUS
  Filled 2014-07-29 (×5): qty 8

## 2014-07-29 MED ORDER — INSULIN NPH (HUMAN) (ISOPHANE) 100 UNIT/ML ~~LOC~~ SUSP
45.0000 [IU] | Freq: Every day | SUBCUTANEOUS | Status: DC
  Administered 2014-07-29: 45 [IU] via SUBCUTANEOUS
  Filled 2014-07-29 (×2): qty 10

## 2014-07-29 MED ORDER — ONDANSETRON HCL 4 MG/2ML IJ SOLN: 4.0000 mg | Freq: Four times a day (QID) | INTRAMUSCULAR | Status: DC | PRN

## 2014-07-29 MED ORDER — METOLAZONE 5 MG PO TABS
5.0000 mg | ORAL_TABLET | Freq: Every day | ORAL | Status: DC
  Administered 2014-07-29 – 2014-07-30 (×2): 5 mg via ORAL
  Filled 2014-07-29 (×4): qty 1

## 2014-07-29 MED ORDER — CVS FISH OIL 1000 MG PO CAPS: 2.0000 | ORAL_CAPSULE | Freq: Every day | ORAL | Status: DC

## 2014-07-29 MED ORDER — OMEGA-3-ACID ETHYL ESTERS 1 G PO CAPS
2.0000 g | ORAL_CAPSULE | Freq: Every day | ORAL | Status: DC
  Administered 2014-07-29 – 2014-07-31 (×3): 2 g via ORAL
  Filled 2014-07-29 (×3): qty 2

## 2014-07-29 MED ORDER — FUROSEMIDE 10 MG/ML IJ SOLN
100.0000 mg | Freq: Once | INTRAVENOUS | Status: AC
  Administered 2014-07-29: 100 mg via INTRAVENOUS
  Filled 2014-07-29: qty 10

## 2014-07-29 MED ORDER — FUROSEMIDE 10 MG/ML IJ SOLN
40.0000 mg | Freq: Three times a day (TID) | INTRAMUSCULAR | Status: DC
  Administered 2014-07-29 (×2): 40 mg via INTRAVENOUS
  Filled 2014-07-29 (×5): qty 4

## 2014-07-29 MED ORDER — INSULIN NPH (HUMAN) (ISOPHANE) 100 UNIT/ML ~~LOC~~ SUSP
20.0000 [IU] | Freq: Every day | SUBCUTANEOUS | Status: DC
  Administered 2014-07-30: 20 [IU] via SUBCUTANEOUS
  Filled 2014-07-29: qty 10

## 2014-07-29 MED ORDER — DOXYCYCLINE HYCLATE 100 MG IV SOLR
100.0000 mg | Freq: Two times a day (BID) | INTRAVENOUS | Status: DC
  Administered 2014-07-29 – 2014-07-31 (×6): 100 mg via INTRAVENOUS
  Filled 2014-07-29 (×7): qty 100

## 2014-07-29 MED ORDER — GABAPENTIN 300 MG PO CAPS
300.0000 mg | ORAL_CAPSULE | Freq: Two times a day (BID) | ORAL | Status: DC
  Administered 2014-07-29 – 2014-07-31 (×5): 300 mg via ORAL
  Filled 2014-07-29 (×6): qty 1

## 2014-07-29 MED ORDER — CARVEDILOL 25 MG PO TABS
25.0000 mg | ORAL_TABLET | Freq: Two times a day (BID) | ORAL | Status: DC
  Administered 2014-07-29 – 2014-07-31 (×4): 25 mg via ORAL
  Filled 2014-07-29 (×7): qty 1

## 2014-07-29 MED ORDER — AMLODIPINE BESYLATE 5 MG PO TABS
5.0000 mg | ORAL_TABLET | Freq: Every day | ORAL | Status: DC
  Filled 2014-07-29 (×2): qty 1

## 2014-07-29 MED ORDER — FENOFIBRATE 54 MG PO TABS
54.0000 mg | ORAL_TABLET | Freq: Every day | ORAL | Status: DC
  Administered 2014-07-29 – 2014-07-31 (×3): 54 mg via ORAL
  Filled 2014-07-29 (×3): qty 1

## 2014-07-29 MED ORDER — ENALAPRIL MALEATE 2.5 MG PO TABS
2.5000 mg | ORAL_TABLET | Freq: Two times a day (BID) | ORAL | Status: DC
  Administered 2014-07-30 – 2014-07-31 (×3): 2.5 mg via ORAL
  Filled 2014-07-29 (×6): qty 1

## 2014-07-29 MED ORDER — INSULIN ASPART 100 UNIT/ML ~~LOC~~ SOLN
0.0000 [IU] | Freq: Three times a day (TID) | SUBCUTANEOUS | Status: DC
  Administered 2014-07-29: 1 [IU] via SUBCUTANEOUS
  Administered 2014-07-29 (×2): 2 [IU] via SUBCUTANEOUS
  Administered 2014-07-30: 5 [IU] via SUBCUTANEOUS
  Administered 2014-07-31: 1 [IU] via SUBCUTANEOUS
  Administered 2014-07-31: 5 [IU] via SUBCUTANEOUS

## 2014-07-29 MED ORDER — LORAZEPAM 0.5 MG PO TABS
0.5000 mg | ORAL_TABLET | Freq: Two times a day (BID) | ORAL | Status: DC | PRN
  Administered 2014-07-29 – 2014-07-30 (×2): 0.5 mg via ORAL
  Filled 2014-07-29 (×2): qty 1

## 2014-07-29 MED ORDER — ACETAMINOPHEN 650 MG RE SUPP: 650.0000 mg | Freq: Four times a day (QID) | RECTAL | Status: DC | PRN

## 2014-07-29 MED ORDER — FLUTICASONE PROPIONATE 50 MCG/ACT NA SUSP
2.0000 | Freq: Every day | NASAL | Status: DC | PRN
  Filled 2014-07-29: qty 16

## 2014-07-29 MED ORDER — LEVOTHYROXINE SODIUM 25 MCG PO TABS
25.0000 ug | ORAL_TABLET | Freq: Every day | ORAL | Status: DC
  Administered 2014-07-29 – 2014-07-31 (×2): 25 ug via ORAL
  Filled 2014-07-29 (×4): qty 1

## 2014-07-29 MED ORDER — ACETAMINOPHEN 325 MG PO TABS: 650.0000 mg | ORAL_TABLET | Freq: Four times a day (QID) | ORAL | Status: DC | PRN

## 2014-07-29 MED ORDER — ESCITALOPRAM OXALATE 10 MG PO TABS
10.0000 mg | ORAL_TABLET | Freq: Every day | ORAL | Status: DC
  Administered 2014-07-29 – 2014-07-31 (×3): 10 mg via ORAL
  Filled 2014-07-29 (×3): qty 1

## 2014-07-29 MED ORDER — WARFARIN - PHARMACIST DOSING INPATIENT
Freq: Every day | Status: DC
  Administered 2014-07-30: 18:00:00

## 2014-07-29 NOTE — Progress Notes (Signed)
Utilization review completed.  

## 2014-07-29 NOTE — Progress Notes (Signed)
RT Note: Pt has home cpap and places self on/off.  Rt will continue to monitor.

## 2014-07-29 NOTE — Progress Notes (Signed)
Advanced Heart Failure Rounding Note   Subjective:    74 yo woman with PMH of CAD s/p CABG '03, systolic/diastolic heart failure with [facility] BIVICD and last known EF 4/15 with EF 40%, dyslipidemia, chronic back pain, morbid obesity, T2DM, atrial fibrillation and OSA on CPAP admitted 3/15 with ADHF. Weigh up about 5-6 pounds.   Not diuresing that much but says she feels some better. Dyspneic on any exertion. +orthopnea.    Objective:   Weight Range:  Vital Signs:   Temp:  [97.9 F (36.6 C)-99.1 F (37.3 C)] 98.1 F (36.7 C) (03/17 1423) Pulse Rate:  [71-106] 76 (03/17 1423) Resp:  [15-32] 18 (03/17 1423) BP: (100-127)/(32-89) 120/79 mmHg (03/17 1423) SpO2:  [87 %-97 %] 97 % (03/17 1423) Weight:  [462.703 kg (262 lb)-119.296 kg (263 lb)] 119.296 kg (263 lb) (03/17 0141) Last BM Date: 07/27/14  Weight change: Filed Weights   07/28/14 2053 07/29/14 0141  Weight: 118.842 kg (262 lb) 119.296 kg (263 lb)    Intake/Output:   Intake/Output Summary (Last 24 hours) at 07/29/14 1748 Last data filed at 07/29/14 1600  Gross per 24 hour  Intake   1030 ml  Output   2125 ml  Net  -1095 ml     Physical Exam: General:  Obese woman lying in bed. + dyspneic HEENT: normal Neck: supple. Thick. JVP hard to see but looks elevated. C No lymphadenopathy or thryomegaly appreciated. Cor: PMI nonpalpable Irregular rate & rhythm. No rubs, gallops or murmurs. Lungs: + crackles Abdomen: morbidly obese , nontender, nondistended. No hepatosplenomegaly. No bruits or masses. Good bowel sounds. Extremities: no cyanosis, clubbing, rash, 1-2+ edema Neuro: alert & orientedx3, cranial nerves grossly intact. moves all 4 extremities w/o difficulty. Affect pleasant   Telemetry: AF with occasional V spikes ~100 (viewed personally)  Labs: Basic Metabolic Panel:  Recent Labs Lab 07/28/14 1000 07/28/14 2048 07/29/14 0500  NA 139 139 138  K 4.8 4.7 4.6  CL 104 104 102  CO2 28 25 26   GLUCOSE 116*  107* 183*  BUN 44* 43* 44*  CREATININE 2.15* 2.02* 2.14*  CALCIUM 8.8 8.7 8.8    Liver Function Tests:  Recent Labs Lab 07/29/14 0500  AST 14  ALT 12  ALKPHOS 57  BILITOT 0.8  PROT 6.7  ALBUMIN 3.4*   No results for input(s): LIPASE, AMYLASE in the last 168 hours. No results for input(s): AMMONIA in the last 168 hours.  CBC:  Recent Labs Lab 07/28/14 2048 07/29/14 0500  WBC 14.7* 10.2  NEUTROABS  --  6.7  HGB 12.0 11.5*  HCT 36.6 35.4*  MCV 90.4 91.7  PLT 219 182    Cardiac Enzymes:  Recent Labs Lab 07/29/14 0500  TROPONINI 0.03    BNP: BNP (last 3 results)  Recent Labs  07/15/14 1025 07/28/14 1000 07/28/14 2048  BNP 188.8* 171.5* 353.9*    ProBNP (last 3 results)  Recent Labs  09/07/13 1050  PROBNP 983.0*      Other results:    Imaging: Dg Chest Port 1 View  07/28/2014   CLINICAL DATA:  74 year old female with a history of shortness of breath for 4 days.  EXAM: PORTABLE CHEST - 1 VIEW  COMPARISON:  11/30/2013  FINDINGS: Cardiomediastinal silhouette likely unchanged in size and contour, partially screw by overlying lung and pleural disease.  Fullness in the central vasculature.  Surgical changes of prior median sternotomy and CABG.  Unchanged position of cardiac pacing device on left chest wall, with right  ventricular, right atrial, and coronary sinus leads.  Mixed interstitial and airspace disease, with interlobular septal thickening. Lung bases not well evaluated.  No evidence of displaced fracture, with similar appearance of posttraumatic deformity of the right chest wall.  IMPRESSION: Evidence of congestive heart failure and pulmonary edema. Small pleural effusions cannot be excluded.  Unchanged position of cardiac pacing device/ AICD.  Signed,  Dulcy Fanny. Earleen Newport, DO  [facility]  [facility] Radiology   Electronically Signed   By: Corrie Mckusick D.O.   On: 07/28/2014 21:31      Medications:      Scheduled Medications: . amLODipine  5 mg Oral Daily  . atorvastatin  20 mg Oral Daily  . carvedilol  25 mg Oral BID WC  . doxycycline (VIBRAMYCIN) IV  100 mg Intravenous Q12H  . enalapril  2.5 mg Oral BID  . escitalopram  10 mg Oral Daily  . fenofibrate  54 mg Oral Daily  . furosemide  40 mg Intravenous Q8H  . gabapentin  300 mg Oral BID  . insulin aspart  0-9 Units Subcutaneous TID WC  . insulin NPH Human  20 Units Subcutaneous QHS  . insulin NPH Human  45 Units Subcutaneous Q breakfast  . levothyroxine  25 mcg Oral QAC breakfast  . omega-3 acid ethyl esters  2 g Oral Daily  . sodium chloride  3 mL Intravenous Q12H  . Warfarin - Pharmacist Dosing Inpatient   Does not apply q1800     Infusions:     PRN Medications:  acetaminophen **OR** acetaminophen, fluticasone, LORazepam, ondansetron **OR** ondansetron (ZOFRAN) IV   Assessment:   1. Acute on chronic systolic and diastolic HF, EF 26% - s/p BIVICD 2. CKD, stage III 3. PAF  4. Morbid obesity 5. DM2 6. Acute on chronic resp failure    Plan/Discussion:     I suspect she ADHF in setting of recurrent PAF and loss of AV synchrony and BIV pacing. Not diuresing well. Change lasix to 80 IV bid and add metolazone. Will have STJ interrogate ICD. INR has been > 2.0 for a long time so can possibly cardiovert tomorrow. Will keep NPO. INR high so holding warfarin.   She was previously DNR/DNI but now wants Full Code.   Length of Stay: 1   Glori Bickers MD 07/29/2014, 5:48 PM  Advanced Heart Failure Team Pager [phone] (M-F; 7a - 4p)  Please contact [facility] for night-coverage after hours (4p -7a ) and weekends on [email or web address]

## 2014-07-29 NOTE — Progress Notes (Addendum)
ANTICOAGULATION CONSULT NOTE - Initial Consult  Pharmacy Consult for Coumadin Indication: atrial fibrillation  Allergies  Allergen Reactions  . Sulfonamide Derivatives Other (See Comments)    Unknown; "childhood allergy/mother"    Patient Measurements: Height: 5\' 4"  (162.6 cm) Weight: 262 lb (118.842 kg) IBW/kg (Calculated) : 54.7  Vital Signs: Temp: 99.1 F (37.3 C) (03/16 2053) Temp Source: Oral (03/16 2053) BP: 113/89 mmHg (03/17 0045) Pulse Rate: 89 (03/17 0045)  Labs:  Recent Labs  07/28/14 1000 07/28/14 2048  HGB  --  12.0  HCT  --  36.6  PLT  --  219  LABPROT  --  40.0*  INR  --  4.10*  CREATININE 2.15* 2.02*    Estimated Creatinine Clearance: 32.4 mL/min (by C-G formula based on Cr of 2.02).   Medical History: Past Medical History  Diagnosis Date  . LBBB (left bundle branch block)   . Carotid stenosis     a. 10/19/2011 carotid duplex - Mild hard plaque bilaterally. Stable 40-59% bilateral ICA stenosis. Carotid US (04/2013):  Bilateral 40-59% ICA.  F/u 1 year  . Dyslipidemia   . Hypertension   . Chronic combined systolic and diastolic CHF (congestive heart failure)     a. EF as low as 25% in 2006;  b. EF 60-65% in 12/2011;  c. 02/2013 Echo: EF 35-40, mod mid-dist antsept HK, Gr 2 DD, mild LVH.  . Back pain, chronic     "just when I walk; mass on 3rd and 4th vertebrae right lower back"  . Cardiomyopathy, ischemic     a. 2012 s/p SJM 3231-40 Uni BiV ICD, ser # [id number].  Marland Kitchen CAD (coronary artery disease)     a. 05/2001 CABG x4: LIMA->LAD, VG->D1, VG->D2, VG->RCA.  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 CKD (chronic kidney disease), stage III   . Biventricular implantable cardiac defibrillator in situ 2007, 2012    a. 2007;  b. 2012 Gen change: SJM 3231-40 Uni BiV ICD, ser # [id number].  Marland Kitchen Hypoxemia requiring supplemental oxygen   . Candidiasis of vulva and vagina   . Hypothyroidism   . 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 Benay Spice, in remission  . History of pneumonia 12/27/11    "3 times; it's been a long time ago"  . OSA on CPAP   . Gout ~ 08/2011  . PAF (paroxysmal atrial fibrillation)     on Coumadin  . DM (diabetes mellitus), type 2 with complications     insulin dependent, retinopathy, neuropathy  . Mural thrombus of left ventricle     before 2003, while on Coumadin, No h/o CVA  . Morbid obesity with BMI of 45.0-49.9, adult     Ht. 5'4". BMI 47.2  . Vitamin D deficiency 06/09/2012    historical  . Olecranon bursitis of right elbow 12/29/2012  . History of recurrent UTIs   . Diabetes 05/24/2013  . Hypercalcemia 09/26/2013  . Anemia, unspecified 12/13/2013  . Renal insufficiency 03/14/2014    Following with [facility], Dr Loyal Buba     Assessment: 74yo female c/o SOB w/ exertion since Sunday w/ worsening today, admitted for acute on chronic CHF w/ volume overload, to continue Coumadin for Afib; current INR above goal w/ last Coumadin dose taken 3/15.  Goal of Therapy:  INR 2-3   Plan:  Will hold Coumadin for now and monitor INR to restart and adjust dose.  Wynona Neat, PharmD,  BCPS  07/29/2014,1:25 AM    ADDENDUM: Now to hold Coumadin and start heparin bridge; will monitor INR and start heparin when INR <2.  VB 5:07 AM

## 2014-07-29 NOTE — Progress Notes (Signed)
Pt arrived to floor in NAD, VSS, pt A/Ox4, standby assist. Oriented to room and floor. Will continue to monitor. Ronnette Hila, RN

## 2014-07-29 NOTE — Clinical Documentation Improvement (Signed)
  Please clarify and document the clinical condition associated with the findings  Below.  On admission, noted pt.presenting with dyspnea on exertion/shortness of breath  she normally wears 2 L of home oxygen and  is increased to 5 L. Nurses flowsheet indicates  O2 @4L /[place] with sats 95%   . Document acuity: --Acute --Chronic --Acute on chronic . Document inclusion of: --Hypoxia --Hypercapnia  Supporting Information: PMH Hypoxemia requiring supplemental oxygen CHF, HTN, OSA on CPAP, morbid obesity  ED notes: "Pulmonary/Chest: She is in respiratory distress (moderate, visibly short of breath). She has decreased breath sounds (at bases). She has wheezes (mild). She has rhonchi (bases). "  Thank You,  Melvia Heaps, RN, BSN, [place] [phone] [place]

## 2014-07-29 NOTE — Progress Notes (Signed)
Pt BP 111/41 this AM, received 100mg  IVPB lasix at 2am. PA on call notified and ordered to re-schedule AM lasix to 8AM d/t soft BP.

## 2014-07-29 NOTE — Progress Notes (Signed)
Pt unable to wear hospital CPAP, pt states she wears a CPAP with nasal prongs at home, RT notified and stated patient will need to bring CPAP from home. Pt notified that she needs to bring CPAP from home.

## 2014-07-29 NOTE — Progress Notes (Signed)
PROGRESS NOTE  Destiny [name] JTT:[id number] DOB: 1941/04/20 DOA: 07/28/2014 PCP: Penni Homans, MD  HPI/Recap of past 24 hours: Patient is a 74 year old female with past medical history of atrial fibrillation on chronic anticoagulation, chronic respiratory failure on 2 L nasal cannula, diabetes mellitus and stage III chronic kidney disease, and chronic systolic/diastolic heart failure who was admitted nearly morning of 3/17 for several days of progressively worsening shortness of breath and found to be in acute heart failure.  Patient admitted earlier this morning and seen after arrival to floor. Breathing little bit more comfortably now.  Assessment/Plan: Principal Problem:   Acute on chronic respiratory failure with hypoxia secondary to Acute on chronic combined systolic and diastolic congestive heart failure: Oxygen requirements at 4-5 liters, baseline 2 L. Continue to diurese. Appreciate cardiology help. Patient down already 1.5 L. Patient states her dry weight is between 255-260. Weight noted on admission at 263 Active Problems:   Essential hypertension: Continue home medications   ATRIAL FIBRILLATION, PAROXYSMAL: Rate controlled. See coagulopathy below   OSA (obstructive sleep apnea): Patient will bring in home see Pap   CKD (chronic kidney disease) stage 3, GFR 30-59 ml/min: Monitoring renal function while diuresing   Hypothyroidism: Continue Synthroid   Type 2 diabetes mellitus, controlled, with renal complications: Sliding scale plus long-term insulin, checking A1c   Coagulopathy: Elevated INR, most likely from decreased Coumadin clearance brought on by secondary hepatic congestion from heart failure. She improve as she has diuresed    Morbid obesity: Patient is criteria with BMI greater than 40   Code Status: Full code  Family Communication: Left message with husband  Disposition Plan: Home once fully  diuresed   Consultants:  Cardiology  Procedures:  None  Antibiotics:  None   Objective: BP 120/79 mmHg  Pulse 76  Temp(Src) 98.1 F (36.7 C) (Oral)  Resp 18  Ht 5\' 4"  (1.626 m)  Wt 119.296 kg (263 lb)  BMI 45.12 kg/m2  SpO2 97%  Intake/Output Summary (Last 24 hours) at 07/29/14 1612 Last data filed at 07/29/14 1424  Gross per 24 hour  Intake    670 ml  Output   2125 ml  Net  -1455 ml   Filed Weights   07/28/14 2053 07/29/14 0141  Weight: 118.842 kg (262 lb) 119.296 kg (263 lb)    Exam:   General:  Alert and oriented 3, no acute distress  Cardiovascular: Irregular rhythm, rate controlled, 2/6 systolic ejection murmur  Respiratory: Decreased breath sounds bibasilar  Abdomen: Soft, nontender, nondistended, positive bowel sounds  Musculoskeletal: No clubbing or cyanosis, 2+ pitting edema from the knees down   Data Reviewed: Basic Metabolic Panel:  Recent Labs Lab 07/28/14 1000 07/28/14 2048 07/29/14 0500  NA 139 139 138  K 4.8 4.7 4.6  CL 104 104 102  CO2 28 25 26   GLUCOSE 116* 107* 183*  BUN 44* 43* 44*  CREATININE 2.15* 2.02* 2.14*  CALCIUM 8.8 8.7 8.8   Liver Function Tests:  Recent Labs Lab 07/29/14 0500  AST 14  ALT 12  ALKPHOS 57  BILITOT 0.8  PROT 6.7  ALBUMIN 3.4*   No results for input(s): LIPASE, AMYLASE in the last 168 hours. No results for input(s): AMMONIA in the last 168 hours. CBC:  Recent Labs Lab 07/28/14 2048 07/29/14 0500  WBC 14.7* 10.2  NEUTROABS  --  6.7  HGB 12.0 11.5*  HCT 36.6 35.4*  MCV 90.4 91.7  PLT 219 182   Cardiac Enzymes:  Recent Labs Lab 07/29/14 0500  TROPONINI 0.03   BNP (last 3 results)  Recent Labs  07/15/14 1025 07/28/14 1000 07/28/14 2048  BNP 188.8* 171.5* 353.9*    ProBNP (last 3 results)  Recent Labs  09/07/13 1050  PROBNP 983.0*    CBG:  Recent Labs Lab 07/29/14 0612 07/29/14 1143  GLUCAP 181* 193*    No results found for this or any previous  visit (from the past 240 hour(s)).   Studies: Dg Chest Port 1 View  07/28/2014   CLINICAL DATA:  74 year old female with a history of shortness of breath for 4 days.  EXAM: PORTABLE CHEST - 1 VIEW  COMPARISON:  11/30/2013  FINDINGS: Cardiomediastinal silhouette likely unchanged in size and contour, partially screw by overlying lung and pleural disease.  Fullness in the central vasculature.  Surgical changes of prior median sternotomy and CABG.  Unchanged position of cardiac pacing device on left chest wall, with right ventricular, right atrial, and coronary sinus leads.  Mixed interstitial and airspace disease, with interlobular septal thickening. Lung bases not well evaluated.  No evidence of displaced fracture, with similar appearance of posttraumatic deformity of the right chest wall.  IMPRESSION: Evidence of congestive heart failure and pulmonary edema. Small pleural effusions cannot be excluded.  Unchanged position of cardiac pacing device/ AICD.  Signed,  Dulcy Fanny. Earleen Newport, DO  [facility]  [facility]   Electronically Signed   By: Corrie Mckusick D.O.   On: 07/28/2014 21:31    Scheduled Meds: . amLODipine  5 mg Oral Daily  . atorvastatin  20 mg Oral Daily  . carvedilol  25 mg Oral BID WC  . doxycycline (VIBRAMYCIN) IV  100 mg Intravenous Q12H  . enalapril  2.5 mg Oral BID  . escitalopram  10 mg Oral Daily  . fenofibrate  54 mg Oral Daily  . furosemide  40 mg Intravenous Q8H  . gabapentin  300 mg Oral BID  . insulin aspart  0-9 Units Subcutaneous TID WC  . insulin NPH Human  20 Units Subcutaneous QHS  . insulin NPH Human  45 Units Subcutaneous Q breakfast  . levothyroxine  25 mcg Oral QAC breakfast  . omega-3 acid ethyl esters  2 g Oral Daily  . sodium chloride  3 mL Intravenous Q12H  . Warfarin - Pharmacist Dosing Inpatient   Does not apply q1800    Continuous Infusions:    Time spent: 25 minutes  [place]  [facility] Pager [phone]. If 7PM-7AM, please contact night-coverage at [email or web address], password [facility] 07/29/2014, 4:12 PM  LOS: 1 day

## 2014-07-30 ENCOUNTER — Inpatient Hospital Stay (HOSPITAL_COMMUNITY): Payer: Medicare Other | Admitting: Anesthesiology

## 2014-07-30 ENCOUNTER — Encounter (HOSPITAL_COMMUNITY): Payer: Self-pay | Admitting: *Deleted

## 2014-07-30 ENCOUNTER — Encounter (HOSPITAL_COMMUNITY)
Admission: EM | Disposition: A | Discharge status: Home / Self Care | Payer: Self-pay | Source: Home / Self Care | Attending: Internal Medicine

## 2014-07-30 DIAGNOSIS — I129 Hypertensive chronic kidney disease with stage 1 through stage 4 chronic kidney disease, or unspecified chronic kidney disease: Secondary | ICD-10-CM

## 2014-07-30 HISTORY — PX: CARDIOVERSION: SHX1299

## 2014-07-30 LAB — BASIC METABOLIC PANEL
ANION GAP: 12 (ref 5–15)
BUN: 56 mg/dL — AB (ref 6–23)
CHLORIDE: 99 mmol/L (ref 96–112)
CO2: 27 mmol/L (ref 19–32)
Calcium: 9.4 mg/dL (ref 8.4–10.5)
Creatinine, Ser: 2.17 mg/dL — ABNORMAL HIGH (ref 0.50–1.10)
GFR, EST AFRICAN AMERICAN: 25 mL/min — AB (ref >90)
GFR, EST NON AFRICAN AMERICAN: 22 mL/min — AB (ref >90)
Glucose, Bld: 100 mg/dL — ABNORMAL HIGH (ref 70–99)
Potassium: 4.7 mmol/L (ref 3.5–5.1)
SODIUM: 138 mmol/L (ref 135–145)

## 2014-07-30 LAB — GLUCOSE, CAPILLARY
GLUCOSE-CAPILLARY: 156 mg/dL — AB (ref 70–99)
GLUCOSE-CAPILLARY: 264 mg/dL — AB (ref 70–99)
Glucose-Capillary: 108 mg/dL — ABNORMAL HIGH (ref 70–99)
Glucose-Capillary: 275 mg/dL — ABNORMAL HIGH (ref 70–99)

## 2014-07-30 LAB — PROTIME-INR
INR: 3.03 — ABNORMAL HIGH (ref 0.00–1.49)
Prothrombin Time: 31.6 seconds — ABNORMAL HIGH (ref 11.6–15.2)

## 2014-07-30 SURGERY — CARDIOVERSION
Anesthesia: Monitor Anesthesia Care

## 2014-07-30 MED ORDER — INSULIN NPH (HUMAN) (ISOPHANE) 100 UNIT/ML ~~LOC~~ SUSP
48.0000 [IU] | Freq: Every day | SUBCUTANEOUS | Status: DC
Start: 2014-07-31 — End: 2014-07-31
  Administered 2014-07-31: 48 [IU] via SUBCUTANEOUS
  Filled 2014-07-30: qty 10

## 2014-07-30 MED ORDER — WARFARIN SODIUM 7.5 MG PO TABS
7.5000 mg | ORAL_TABLET | Freq: Once | ORAL | Status: AC
  Administered 2014-07-30: 7.5 mg via ORAL
  Filled 2014-07-30: qty 1

## 2014-07-30 MED ORDER — SODIUM CHLORIDE 0.9 % IV SOLN
INTRAVENOUS | Status: DC
  Administered 2014-07-30: 500 mL via INTRAVENOUS

## 2014-07-30 MED ORDER — HYDROMORPHONE HCL 1 MG/ML IJ SOLN: 0.2500 mg | INTRAMUSCULAR | Status: DC | PRN

## 2014-07-30 MED ORDER — LIDOCAINE HCL (CARDIAC) 20 MG/ML IV SOLN
INTRAVENOUS | Status: DC | PRN
  Administered 2014-07-30: 40 mg via INTRAVENOUS

## 2014-07-30 MED ORDER — PROPOFOL 10 MG/ML IV BOLUS
INTRAVENOUS | Status: DC | PRN
  Administered 2014-07-30: 70 mg via INTRAVENOUS

## 2014-07-30 MED ORDER — ONDANSETRON HCL 4 MG/2ML IJ SOLN: 4.0000 mg | Freq: Once | INTRAMUSCULAR | Status: AC | PRN

## 2014-07-30 NOTE — Progress Notes (Signed)
PROGRESS NOTE  Destiny [name] JHE:[id number] DOB: 1940/05/16 DOA: 07/28/2014 PCP: Penni Homans, MD  HPI/Recap of past 24 hours: Patient is a 74 year old female with past medical history of atrial fibrillation on chronic anticoagulation, chronic respiratory failure on 2 L nasal cannula, diabetes mellitus and stage III chronic kidney disease, and chronic systolic/diastolic heart failure who was admitted nearly morning of 3/17 for several days of progressively worsening shortness of breath and found to be in acute heart failure.  Patient seen status post cardioversion with no complaints. Breathing comfortably. No chest pain.  Assessment/Plan: Principal Problem:   Acute on chronic respiratory failure with hypoxia secondary to Acute on chronic combined systolic and diastolic congestive heart failure: Oxygen requirements at 4 liters, baseline 2 L. Continue to diurese. Appreciate cardiology help. Not much diuresis recorded in the last 24 hours, 1 L Patient states her dry weight is between 255-260. Today down to 259 Active Problems:   Essential hypertension: Continue home medications   ATRIAL FIBRILLATION, PAROXYSMAL: Rate controlled. See coagulopathy below. Status post cardioversion   OSA (obstructive sleep apnea): Patient will bring in home see Pap   Acute kidney injury in the setting of CKD (chronic kidney disease) stage 3, GFR 30-59 ml/min: Monitoring renal function while diuresing   Hypothyroidism: Continue Synthroid, TSH is normal   Type 2 diabetes mellitus, controlled, with renal complications: Sliding scale plus long-term insulin, A1c still pending   Coagulopathy: Elevated INR, most likely from decreased Coumadin clearance brought on by secondary hepatic congestion from heart failure. Continues to improve as she has diuresed    Morbid obesity: Patient is criteria with BMI greater than 40   Code Status: Full code  Family Communication: Left message with husband  Disposition Plan: Home  once fully diuresed   Consultants:  Cardiology  Procedures:  3/18: Successful cardioversion  Antibiotics:  None   Objective: BP 107/58 mmHg  Pulse 71  Temp(Src) 97.4 F (36.3 C) (Oral)  Resp 16  Ht 5\' 4"  (1.626 m)  Wt 117.845 kg (259 lb 12.8 oz)  BMI 44.57 kg/m2  SpO2 96%  Intake/Output Summary (Last 24 hours) at 07/30/14 1603 Last data filed at 07/30/14 1157  Gross per 24 hour  Intake    100 ml  Output      0 ml  Net    100 ml   Filed Weights   07/28/14 2053 07/29/14 0141 07/30/14 0445  Weight: 118.842 kg (262 lb) 119.296 kg (263 lb) 117.845 kg (259 lb 12.8 oz)    Exam:   General:  Alert and oriented 3, no acute distress  Cardiovascular: Regular rate and rhythm, 2/6 systolic ejection murmur   Respiratory: Decreased breath sounds bibasilar  Abdomen: Soft, nontender, nondistended, positive bowel sounds  Musculoskeletal: No clubbing or cyanosis, 2+ pitting edema from the knees down   Data Reviewed: Basic Metabolic Panel:  Recent Labs Lab 07/28/14 1000 07/28/14 2048 07/29/14 0500 07/30/14 0450  NA 139 139 138 138  K 4.8 4.7 4.6 4.7  CL 104 104 102 99  CO2 28 25 26 27   GLUCOSE 116* 107* 183* 100*  BUN 44* 43* 44* 56*  CREATININE 2.15* 2.02* 2.14* 2.17*  CALCIUM 8.8 8.7 8.8 9.4   Liver Function Tests:  Recent Labs Lab 07/29/14 0500  AST 14  ALT 12  ALKPHOS 57  BILITOT 0.8  PROT 6.7  ALBUMIN 3.4*   No results for input(s): LIPASE, AMYLASE in the last 168 hours. No results for input(s): AMMONIA in the last 168  hours. CBC:  Recent Labs Lab 07/28/14 2048 07/29/14 0500  WBC 14.7* 10.2  NEUTROABS  --  6.7  HGB 12.0 11.5*  HCT 36.6 35.4*  MCV 90.4 91.7  PLT 219 182   Cardiac Enzymes:    Recent Labs Lab 07/29/14 0500  TROPONINI 0.03   BNP (last 3 results)  Recent Labs  07/15/14 1025 07/28/14 1000 07/28/14 2048  BNP 188.8* 171.5* 353.9*    ProBNP (last 3 results)  Recent Labs  09/07/13 1050  PROBNP 983.0*     CBG:  Recent Labs Lab 07/29/14 1143 07/29/14 1640 07/29/14 2045 07/30/14 0609 07/30/14 1235  GLUCAP 193* 129* 169* 108* 156*    No results found for this or any previous visit (from the past 240 hour(s)).   Studies: No results found.  Scheduled Meds: . atorvastatin  20 mg Oral Daily  . carvedilol  25 mg Oral BID WC  . doxycycline (VIBRAMYCIN) IV  100 mg Intravenous Q12H  . enalapril  2.5 mg Oral BID  . escitalopram  10 mg Oral Daily  . fenofibrate  54 mg Oral Daily  . furosemide  80 mg Intravenous BID  . gabapentin  300 mg Oral BID  . insulin aspart  0-9 Units Subcutaneous TID WC  . insulin NPH Human  20 Units Subcutaneous QHS  . insulin NPH Human  45 Units Subcutaneous Q breakfast  . levothyroxine  25 mcg Oral QAC breakfast  . metolazone  5 mg Oral Daily  . omega-3 acid ethyl esters  2 g Oral Daily  . sodium chloride  3 mL Intravenous Q12H  . warfarin  7.5 mg Oral ONCE-1800  . Warfarin - Pharmacist Dosing Inpatient   Does not apply q1800    Continuous Infusions: . sodium chloride 500 mL (07/30/14 1051)     Time spent: 15 minutes  [place] [facility] Pager [phone]. If 7PM-7AM, please contact night-coverage at [email or web address], password [facility] 07/30/2014, 4:03 PM  LOS: 2 days

## 2014-07-30 NOTE — Anesthesia Preprocedure Evaluation (Addendum)
Anesthesia Evaluation  Patient identified by MRN, date of birth, ID band Patient awake    Reviewed: Allergy & Precautions, NPO status , Patient's Chart, lab work & pertinent test results, reviewed documented beta blocker date and time   Airway Mallampati: III  TM Distance: >3 FB Neck ROM: Full    Dental  (+) Teeth Intact   Pulmonary sleep apnea, Continuous Positive Airway Pressure Ventilation and Oxygen sleep apnea ,          Cardiovascular hypertension, Pt. on medications and Pt. on home beta blockers + CAD, + Past MI, + Peripheral Vascular Disease, +CHF and + DOE + dysrhythmias Atrial Fibrillation + Cardiac Defibrillator     Neuro/Psych    GI/Hepatic   Endo/Other  diabetes, Type 2, Oral Hypoglycemic AgentsHypothyroidism   Renal/GU      Musculoskeletal   Abdominal   Peds  Hematology   Anesthesia Other Findings   Reproductive/Obstetrics                            Anesthesia Physical Anesthesia Plan  ASA: IV  Anesthesia Plan: MAC and General   Post-op Pain Management:    Induction: Intravenous  Airway Management Planned: Mask  Additional Equipment:   Intra-op Plan:   Post-operative Plan:   Informed Consent: I have reviewed the patients History and Physical, chart, labs and discussed the procedure including the risks, benefits and alternatives for the proposed anesthesia with the patient or authorized representative who has indicated his/her understanding and acceptance.     Plan Discussed with: CRNA, Anesthesiologist and Surgeon  Anesthesia Plan Comments:        Anesthesia Quick Evaluation

## 2014-07-30 NOTE — Progress Notes (Signed)
ANTICOAGULATION CONSULT NOTE - Follow Up Consult  Pharmacy Consult for Coumadin Indication: atrial fibrillation  Allergies  Allergen Reactions  . Sulfonamide Derivatives Other (See Comments)    Unknown; "childhood allergy/mother"    Patient Measurements: Height: 5\' 4"  (162.6 cm) Weight: 259 lb 12.8 oz (117.845 kg) IBW/kg (Calculated) : 54.7  Vital Signs: Temp: 97.4 F (36.3 C) (03/18 1420) Temp Source: Oral (03/18 1420) BP: 107/58 mmHg (03/18 1420) Pulse Rate: 71 (03/18 1420)  Labs:  Recent Labs  07/28/14 2048 07/29/14 0500 07/30/14 0450  HGB 12.0 11.5*  --   HCT 36.6 35.4*  --   PLT 219 182  --   LABPROT 40.0* 38.3* 31.6*  INR 4.10* 3.87* 3.03*  CREATININE 2.02* 2.14* 2.17*  TROPONINI  --  0.03  --     Estimated Creatinine Clearance: 30 mL/min (by C-G formula based on Cr of 2.17).   Medical History: Past Medical History  Diagnosis Date  . LBBB (left bundle branch block)   . Carotid stenosis     a. 10/19/2011 carotid duplex - Mild hard plaque bilaterally. Stable 40-59% bilateral ICA stenosis. Carotid US (04/2013):  Bilateral 40-59% ICA.  F/u 1 year  . Dyslipidemia   . Hypertension   . Chronic combined systolic and diastolic CHF (congestive heart failure)     a. EF as low as 25% in 2006;  b. EF 60-65% in 12/2011;  c. 02/2013 Echo: EF 35-40, mod mid-dist antsept HK, Gr 2 DD, mild LVH.  . Back pain, chronic     "just when I walk; mass on 3rd and 4th vertebrae right lower back"  . Cardiomyopathy, ischemic     a. 2012 s/p SJM 3231-40 Uni BiV ICD, ser # [id number].  Marland Kitchen CAD (coronary artery disease)     a. 05/2001 CABG x4: LIMA->LAD, VG->D1, VG->D2, VG->RCA.  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 CKD (chronic kidney disease), stage III   . Biventricular implantable cardiac defibrillator in situ 2007, 2012    a. 2007;  b. 2012 Gen change: SJM 3231-40 Uni BiV ICD, ser # [id number].  Marland Kitchen Hypoxemia requiring supplemental oxygen   . Candidiasis of  vulva and vagina   . Hypothyroidism   . 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 Benay Spice, in remission  . History of pneumonia 12/27/11    "3 times; it's been a long time ago"  . OSA on CPAP   . Gout ~ 08/2011  . PAF (paroxysmal atrial fibrillation)     on Coumadin  . DM (diabetes mellitus), type 2 with complications     insulin dependent, retinopathy, neuropathy  . Mural thrombus of left ventricle     before 2003, while on Coumadin, No h/o CVA  . Morbid obesity with BMI of 45.0-49.9, adult     Ht. 5'4". BMI 47.2  . Vitamin D deficiency 06/09/2012    historical  . Olecranon bursitis of right elbow 12/29/2012  . History of recurrent UTIs   . Diabetes 05/24/2013  . Hypercalcemia 09/26/2013  . Anemia, unspecified 12/13/2013  . Renal insufficiency 03/14/2014    Following with [facility], Dr Loyal Buba     Assessment: 74yo female c/o SOB w/ exertion since Sunday w/ worsening today, admitted for acute on chronic CHF w/ volume overload, to continue Coumadin for Afib;  Admit  INR 4  above goal w/ last Coumadin dose taken 3/15. Likely d/t dec liver perfusion with HF exacerbation  and Afib RVR   Current INR 3.03 - expect INR to continue to drop will try to prevent drop < 2 - restart Home dose Coumadin 7.5mg   S/p DCCV 3/18 > SR  Goal of Therapy:  INR 2-3   Plan: Coumadin 7.5 mg x1 Daily INR   Bonnita Nasuti Pharm.D. CPP, BCPS Clinical Pharmacist [phone] 07/30/2014 3:24 PM

## 2014-07-30 NOTE — Progress Notes (Signed)
Pt placed self on home cpap for the night. Will monotor

## 2014-07-30 NOTE — Transfer of Care (Signed)
Immediate Anesthesia Transfer of Care Note  Patient: Destiny [name]  Procedure(s) Performed: Procedure(s): CARDIOVERSION (N/A)  Patient Location: PACU and Endoscopy Unit  Anesthesia Type:MAC  Level of Consciousness: awake, alert , oriented and patient cooperative  Airway & Oxygen Therapy: Patient Spontanous Breathing and Patient connected to nasal cannula oxygen  Post-op Assessment: Report given to RN and Patient moving all extremities  Post vital signs: Reviewed and stable  Last Vitals:  Filed Vitals:   07/30/14 1155  BP: 130/83  Pulse: 70  Temp:   Resp: 13    Complications: No apparent anesthesia complications

## 2014-07-30 NOTE — Interval H&P Note (Signed)
History and Physical Interval Note:  07/30/2014 11:31 AM  Destiny [name]  has presented today for surgery, with the diagnosis of a fib  The various methods of treatment have been discussed with the patient and family. After consideration of risks, benefits and other options for treatment, the patient has consented to  Procedure(s): CARDIOVERSION (N/A) as a surgical intervention .  The patient's history has been reviewed, patient examined, no change in status, stable for surgery.  I have reviewed the patient's chart and labs.  Questions were answered to the patient's satisfaction.     Cain Fitzhenry

## 2014-07-30 NOTE — Progress Notes (Signed)
Advanced Heart Failure Rounding Note   Subjective:    74 yo woman with PMH of CAD s/p CABG '03, systolic/diastolic heart failure with [facility] BIVICD and last known EF 4/15 with EF 40%, dyslipidemia, chronic back pain, morbid obesity, T2DM, atrial fibrillation and OSA on CPAP admitted 3/15 with ADHF. Weigh up about 5-6 pounds.   ICD interrogated this am. Shows several weeks of AF. Diuresing ok. Weight down 4 pounds. Renal function stable.    Objective:   Weight Range:  Vital Signs:   Temp:  [98.1 F (36.7 C)] 98.1 F (36.7 C) (03/18 0445) Pulse Rate:  [61-91] 70 (03/18 0830) Resp:  [18-19] 19 (03/18 0445) BP: (97-136)/(40-79) 127/54 mmHg (03/18 0830) SpO2:  [95 %-97 %] 95 % (03/18 0445) Weight:  [117.845 kg (259 lb 12.8 oz)] 117.845 kg (259 lb 12.8 oz) (03/18 0445) Last BM Date: 07/29/14  Weight change: Filed Weights   07/28/14 2053 07/29/14 0141 07/30/14 0445  Weight: 118.842 kg (262 lb) 119.296 kg (263 lb) 117.845 kg (259 lb 12.8 oz)    Intake/Output:   Intake/Output Summary (Last 24 hours) at 07/30/14 1000 Last data filed at 07/29/14 1600  Gross per 24 hour  Intake    360 ml  Output    650 ml  Net   -290 ml     Physical Exam: General:  Obese woman lying in bed. HEENT: normal Neck: supple. Thick. JVP hard to see but looks elevated. C No lymphadenopathy or thryomegaly appreciated. Cor: PMI nonpalpable Irregular rate & rhythm. No rubs, gallops or murmurs. Lungs: + crackles Abdomen: morbidly obese , nontender, nondistended. No hepatosplenomegaly. No bruits or masses. Good bowel sounds. Extremities: no cyanosis, clubbing, rash, 1+ edema Neuro: alert & orientedx3, cranial nerves grossly intact. moves all 4 extremities w/o difficulty. Affect pleasant   Telemetry: AF with occasional V spikes ~100 (viewed personally)  Labs: Basic Metabolic Panel:  Recent Labs Lab 07/28/14 1000 07/28/14 2048 07/29/14 0500 07/30/14 0450  NA 139 139 138 138  K 4.8 4.7 4.6 4.7   CL 104 104 102 99  CO2 28 25 26 27   GLUCOSE 116* 107* 183* 100*  BUN 44* 43* 44* 56*  CREATININE 2.15* 2.02* 2.14* 2.17*  CALCIUM 8.8 8.7 8.8 9.4    Liver Function Tests:  Recent Labs Lab 07/29/14 0500  AST 14  ALT 12  ALKPHOS 57  BILITOT 0.8  PROT 6.7  ALBUMIN 3.4*   No results for input(s): LIPASE, AMYLASE in the last 168 hours. No results for input(s): AMMONIA in the last 168 hours.  CBC:  Recent Labs Lab 07/28/14 2048 07/29/14 0500  WBC 14.7* 10.2  NEUTROABS  --  6.7  HGB 12.0 11.5*  HCT 36.6 35.4*  MCV 90.4 91.7  PLT 219 182    Cardiac Enzymes:  Recent Labs Lab 07/29/14 0500  TROPONINI 0.03    BNP: BNP (last 3 results)  Recent Labs  07/15/14 1025 07/28/14 1000 07/28/14 2048  BNP 188.8* 171.5* 353.9*    ProBNP (last 3 results)  Recent Labs  09/07/13 1050  PROBNP 983.0*      Other results:    Imaging: Dg Chest Port 1 View  07/28/2014   CLINICAL DATA:  74 year old female with a history of shortness of breath for 4 days.  EXAM: PORTABLE CHEST - 1 VIEW  COMPARISON:  11/30/2013  FINDINGS: Cardiomediastinal silhouette likely unchanged in size and contour, partially screw by overlying lung and pleural disease.  Fullness in the central vasculature.  Surgical  changes of prior median sternotomy and CABG.  Unchanged position of cardiac pacing device on left chest wall, with right ventricular, right atrial, and coronary sinus leads.  Mixed interstitial and airspace disease, with interlobular septal thickening. Lung bases not well evaluated.  No evidence of displaced fracture, with similar appearance of posttraumatic deformity of the right chest wall.  IMPRESSION: Evidence of congestive heart failure and pulmonary edema. Small pleural effusions cannot be excluded.  Unchanged position of cardiac pacing device/ AICD.  Signed,  Dulcy Fanny. Earleen Newport, DO  [facility]  [facility]   Electronically Signed   By: Corrie Mckusick D.O.   On: 07/28/2014 21:31     Medications:     Scheduled Medications: . amLODipine  5 mg Oral Daily  . atorvastatin  20 mg Oral Daily  . carvedilol  25 mg Oral BID WC  . doxycycline (VIBRAMYCIN) IV  100 mg Intravenous Q12H  . enalapril  2.5 mg Oral BID  . escitalopram  10 mg Oral Daily  . fenofibrate  54 mg Oral Daily  . furosemide  80 mg Intravenous BID  . gabapentin  300 mg Oral BID  . insulin aspart  0-9 Units Subcutaneous TID WC  . insulin NPH Human  20 Units Subcutaneous QHS  . insulin NPH Human  45 Units Subcutaneous Q breakfast  . levothyroxine  25 mcg Oral QAC breakfast  . metolazone  5 mg Oral Daily  . omega-3 acid ethyl esters  2 g Oral Daily  . sodium chloride  3 mL Intravenous Q12H  . Warfarin - Pharmacist Dosing Inpatient   Does not apply q1800    Infusions:    PRN Medications: acetaminophen **OR** acetaminophen, fluticasone, LORazepam, ondansetron **OR** ondansetron (ZOFRAN) IV, sodium chloride   Assessment:   1. Acute on chronic systolic and diastolic HF, EF 59% - s/p BIVICD 2. CKD, stage III 3. PAF  4. Morbid obesity 5. DM2 6. Acute on chronic resp failure    Plan/Discussion:     I suspect she ADHF in setting of recurrent PAF and loss of AV synchrony and BIV pacing. ICD interrogation reviewed personally. She has AF x 3-4 weeks.  INR has been > 2.0. Cardioversion planned for 11a.   Diuresis improved on higher dose lasix. Will continue one more day.  With reduced EF, may benefit from switching amlodipine to hydral/nitrate. Renal function K+ preclude ACE/ARB at this point.  She was previously DNR/DNI but now wants Full Code.   Length of Stay: 2   Glori Bickers MD 07/30/2014, 10:00 AM  Advanced Heart Failure Team Pager [phone] (M-F; [place])  Please contact [facility] for night-coverage after hours (4p -7a ) and weekends on [email or web address]

## 2014-07-30 NOTE — CV Procedure (Signed)
     DIRECT CURRENT CARDIOVERSION  NAME:  Destiny [name]   MRN: [id number] DOB:  [birth year]   ADMIT DATE: 07/28/2014   INDICATIONS: Atrial fibrillation    PROCEDURE:   Informed consent was obtained prior to the procedure. The risks, benefits and alternatives for the procedure were discussed and the patient comprehended these risks. Once an appropriate time out was taken, the patient had the defibrillator pads placed in the anterior and posterior position. The patient then underwent sedation by the anesthesia service. Once an appropriate level of sedation was achieved, the patient received a three biphasic, synchronized 200J shocks with eventual conversion to sinus rhythm. No apparent complications.  Maahi Lannan,MD 11:32 AM

## 2014-07-30 NOTE — Anesthesia Postprocedure Evaluation (Signed)
  Anesthesia Post-op Note  Patient: Destiny [name]  Procedure(s) Performed: Procedure(s): CARDIOVERSION (N/A)  Patient Location: PACU  Anesthesia Type:General  Level of Consciousness: awake, alert , oriented and patient cooperative  Airway and Oxygen Therapy: Patient Spontanous Breathing  Post-op Pain: none  Post-op Assessment: Post-op Vital signs reviewed, Patient's Cardiovascular Status Stable, Respiratory Function Stable, Patent Airway, No signs of Nausea or vomiting and Pain level controlled  Post-op Vital Signs: stable  Last Vitals:  Filed Vitals:   07/30/14 1110  BP: 134/59  Pulse: 88  Temp:   Resp: 16    Complications: No apparent anesthesia complications

## 2014-07-30 NOTE — H&P (View-Only) (Signed)
Advanced Heart Failure Rounding Note   Subjective:    74 yo woman with PMH of CAD s/p CABG '03, systolic/diastolic heart failure with [facility] BIVICD and last known EF 4/15 with EF 40%, dyslipidemia, chronic back pain, morbid obesity, T2DM, atrial fibrillation and OSA on CPAP admitted 3/15 with ADHF. Weigh up about 5-6 pounds.   ICD interrogated this am. Shows several weeks of AF. Diuresing ok. Weight down 4 pounds. Renal function stable.    Objective:   Weight Range:  Vital Signs:   Temp:  [98.1 F (36.7 C)] 98.1 F (36.7 C) (03/18 0445) Pulse Rate:  [61-91] 70 (03/18 0830) Resp:  [18-19] 19 (03/18 0445) BP: (97-136)/(40-79) 127/54 mmHg (03/18 0830) SpO2:  [95 %-97 %] 95 % (03/18 0445) Weight:  [117.845 kg (259 lb 12.8 oz)] 117.845 kg (259 lb 12.8 oz) (03/18 0445) Last BM Date: 07/29/14  Weight change: Filed Weights   07/28/14 2053 07/29/14 0141 07/30/14 0445  Weight: 118.842 kg (262 lb) 119.296 kg (263 lb) 117.845 kg (259 lb 12.8 oz)    Intake/Output:   Intake/Output Summary (Last 24 hours) at 07/30/14 1000 Last data filed at 07/29/14 1600  Gross per 24 hour  Intake    360 ml  Output    650 ml  Net   -290 ml     Physical Exam: General:  Obese woman lying in bed. HEENT: normal Neck: supple. Thick. JVP hard to see but looks elevated. C No lymphadenopathy or thryomegaly appreciated. Cor: PMI nonpalpable Irregular rate & rhythm. No rubs, gallops or murmurs. Lungs: + crackles Abdomen: morbidly obese , nontender, nondistended. No hepatosplenomegaly. No bruits or masses. Good bowel sounds. Extremities: no cyanosis, clubbing, rash, 1+ edema Neuro: alert & orientedx3, cranial nerves grossly intact. moves all 4 extremities w/o difficulty. Affect pleasant   Telemetry: AF with occasional V spikes ~100 (viewed personally)  Labs: Basic Metabolic Panel:  Recent Labs Lab 07/28/14 1000 07/28/14 2048 07/29/14 0500 07/30/14 0450  NA 139 139 138 138  K 4.8 4.7 4.6 4.7   CL 104 104 102 99  CO2 28 25 26 27   GLUCOSE 116* 107* 183* 100*  BUN 44* 43* 44* 56*  CREATININE 2.15* 2.02* 2.14* 2.17*  CALCIUM 8.8 8.7 8.8 9.4    Liver Function Tests:  Recent Labs Lab 07/29/14 0500  AST 14  ALT 12  ALKPHOS 57  BILITOT 0.8  PROT 6.7  ALBUMIN 3.4*   No results for input(s): LIPASE, AMYLASE in the last 168 hours. No results for input(s): AMMONIA in the last 168 hours.  CBC:  Recent Labs Lab 07/28/14 2048 07/29/14 0500  WBC 14.7* 10.2  NEUTROABS  --  6.7  HGB 12.0 11.5*  HCT 36.6 35.4*  MCV 90.4 91.7  PLT 219 182    Cardiac Enzymes:  Recent Labs Lab 07/29/14 0500  TROPONINI 0.03    BNP: BNP (last 3 results)  Recent Labs  07/15/14 1025 07/28/14 1000 07/28/14 2048  BNP 188.8* 171.5* 353.9*    ProBNP (last 3 results)  Recent Labs  09/07/13 1050  PROBNP 983.0*      Other results:    Imaging: Dg Chest Port 1 View  07/28/2014   CLINICAL DATA:  74 year old female with a history of shortness of breath for 4 days.  EXAM: PORTABLE CHEST - 1 VIEW  COMPARISON:  11/30/2013  FINDINGS: Cardiomediastinal silhouette likely unchanged in size and contour, partially screw by overlying lung and pleural disease.  Fullness in the central vasculature.  Surgical  changes of prior median sternotomy and CABG.  Unchanged position of cardiac pacing device on left chest wall, with right ventricular, right atrial, and coronary sinus leads.  Mixed interstitial and airspace disease, with interlobular septal thickening. Lung bases not well evaluated.  No evidence of displaced fracture, with similar appearance of posttraumatic deformity of the right chest wall.  IMPRESSION: Evidence of congestive heart failure and pulmonary edema. Small pleural effusions cannot be excluded.  Unchanged position of cardiac pacing device/ AICD.  Signed,  Dulcy Fanny. Earleen Newport, DO  [facility]  [facility]   Electronically Signed   By: Corrie Mckusick D.O.   On: 07/28/2014 21:31     Medications:     Scheduled Medications: . amLODipine  5 mg Oral Daily  . atorvastatin  20 mg Oral Daily  . carvedilol  25 mg Oral BID WC  . doxycycline (VIBRAMYCIN) IV  100 mg Intravenous Q12H  . enalapril  2.5 mg Oral BID  . escitalopram  10 mg Oral Daily  . fenofibrate  54 mg Oral Daily  . furosemide  80 mg Intravenous BID  . gabapentin  300 mg Oral BID  . insulin aspart  0-9 Units Subcutaneous TID WC  . insulin NPH Human  20 Units Subcutaneous QHS  . insulin NPH Human  45 Units Subcutaneous Q breakfast  . levothyroxine  25 mcg Oral QAC breakfast  . metolazone  5 mg Oral Daily  . omega-3 acid ethyl esters  2 g Oral Daily  . sodium chloride  3 mL Intravenous Q12H  . Warfarin - Pharmacist Dosing Inpatient   Does not apply q1800    Infusions:    PRN Medications: acetaminophen **OR** acetaminophen, fluticasone, LORazepam, ondansetron **OR** ondansetron (ZOFRAN) IV, sodium chloride   Assessment:   1. Acute on chronic systolic and diastolic HF, EF 48% - s/p BIVICD 2. CKD, stage III 3. PAF  4. Morbid obesity 5. DM2 6. Acute on chronic resp failure    Plan/Discussion:     I suspect she ADHF in setting of recurrent PAF and loss of AV synchrony and BIV pacing. ICD interrogation reviewed personally. She has AF x 3-4 weeks.  INR has been > 2.0. Cardioversion planned for 11a.   Diuresis improved on higher dose lasix. Will continue one more day.  With reduced EF, may benefit from switching amlodipine to hydral/nitrate. Renal function K+ preclude ACE/ARB at this point.  She was previously DNR/DNI but now wants Full Code.   Length of Stay: 2   Glori Bickers MD 07/30/2014, 10:00 AM  Advanced Heart Failure Team Pager [phone] (M-F; [place])  Please contact [facility] for night-coverage after hours (4p -7a ) and weekends on [email or web address]

## 2014-07-31 DIAGNOSIS — I5041 Acute combined systolic (congestive) and diastolic (congestive) heart failure: Secondary | ICD-10-CM

## 2014-07-31 LAB — BASIC METABOLIC PANEL
Anion gap: 10 (ref 5–15)
BUN: 68 mg/dL — ABNORMAL HIGH (ref 6–23)
CO2: 29 mmol/L (ref 19–32)
Calcium: 8.6 mg/dL (ref 8.4–10.5)
Chloride: 95 mmol/L — ABNORMAL LOW (ref 96–112)
Creatinine, Ser: 2.25 mg/dL — ABNORMAL HIGH (ref 0.50–1.10)
GFR calc Af Amer: 24 mL/min — ABNORMAL LOW (ref >90)
GFR, EST NON AFRICAN AMERICAN: 21 mL/min — AB (ref >90)
GLUCOSE: 159 mg/dL — AB (ref 70–99)
Potassium: 3.7 mmol/L (ref 3.5–5.1)
Sodium: 134 mmol/L — ABNORMAL LOW (ref 135–145)

## 2014-07-31 LAB — PROTIME-INR
INR: 2.59 — ABNORMAL HIGH (ref 0.00–1.49)
Prothrombin Time: 28 seconds — ABNORMAL HIGH (ref 11.6–15.2)

## 2014-07-31 LAB — GLUCOSE, CAPILLARY
GLUCOSE-CAPILLARY: 129 mg/dL — AB (ref 70–99)
GLUCOSE-CAPILLARY: 260 mg/dL — AB (ref 70–99)

## 2014-07-31 MED ORDER — TORSEMIDE 20 MG PO TABS
40.0000 mg | ORAL_TABLET | Freq: Every day | ORAL | Status: DC
Start: 2014-07-31 — End: 2014-07-31
  Filled 2014-07-31: qty 2

## 2014-07-31 MED ORDER — WARFARIN SODIUM 10 MG PO TABS
10.0000 mg | ORAL_TABLET | Freq: Once | ORAL | Status: AC
  Administered 2014-07-31: 10 mg via ORAL
  Filled 2014-07-31: qty 1

## 2014-07-31 NOTE — Progress Notes (Signed)
SUBJECTIVE:  Patient is feeling much better today. She is holding sinus rhythm with pacing after cardioversion yesterday.   Filed Vitals:   07/30/14 1420 07/30/14 1733 07/30/14 2138 07/31/14 0459  BP: 107/58 113/45 116/49 104/34  Pulse: 71 70 69 70  Temp: 97.4 F (36.3 C)  98 F (36.7 C) 97.7 F (36.5 C)  TempSrc: Oral  Oral Oral  Resp: 16  18 18   Height:      Weight:    259 lb 0.7 oz (117.5 kg)  SpO2: 96%  99% 96%     Intake/Output Summary (Last 24 hours) at 07/31/14 0833 Last data filed at 07/31/14 0459  Gross per 24 hour  Intake    100 ml  Output    550 ml  Net   -450 ml    LABS: Basic Metabolic Panel:  Recent Labs  07/30/14 0450 07/31/14 0350  NA 138 134*  K 4.7 3.7  CL 99 95*  CO2 27 29  GLUCOSE 100* 159*  BUN 56* 68*  CREATININE 2.17* 2.25*  CALCIUM 9.4 8.6   Liver Function Tests:  Recent Labs  07/29/14 0500  AST 14  ALT 12  ALKPHOS 57  BILITOT 0.8  PROT 6.7  ALBUMIN 3.4*   No results for input(s): LIPASE, AMYLASE in the last 72 hours. CBC:  Recent Labs  07/28/14 2048 07/29/14 0500  WBC 14.7* 10.2  NEUTROABS  --  6.7  HGB 12.0 11.5*  HCT 36.6 35.4*  MCV 90.4 91.7  PLT 219 182   Cardiac Enzymes:  Recent Labs  07/29/14 0500  TROPONINI 0.03   BNP: Invalid input(s): POCBNP D-Dimer:  Recent Labs  07/28/14 2048  DDIMER 0.31   Hemoglobin A1C: No results for input(s): HGBA1C in the last 72 hours. Fasting Lipid Panel: No results for input(s): CHOL, HDL, LDLCALC, TRIG, CHOLHDL, LDLDIRECT in the last 72 hours. Thyroid Function Tests:  Recent Labs  07/29/14 0500  TSH 1.558    RADIOLOGY: Dg Chest Port 1 View  07/28/2014   CLINICAL DATA:  74 year old female with a history of shortness of breath for 4 days.  EXAM: PORTABLE CHEST - 1 VIEW  COMPARISON:  11/30/2013  FINDINGS: Cardiomediastinal silhouette likely unchanged in size and contour, partially screw by overlying lung and pleural disease.  Fullness in the central  vasculature.  Surgical changes of prior median sternotomy and CABG.  Unchanged position of cardiac pacing device on left chest wall, with right ventricular, right atrial, and coronary sinus leads.  Mixed interstitial and airspace disease, with interlobular septal thickening. Lung bases not well evaluated.  No evidence of displaced fracture, with similar appearance of posttraumatic deformity of the right chest wall.  IMPRESSION: Evidence of congestive heart failure and pulmonary edema. Small pleural effusions cannot be excluded.  Unchanged position of cardiac pacing device/ AICD.  Signed,  Destiny [name]. Destiny Newport, DO  [facility]  [facility]   Electronically Signed   By: Destiny [name] D.O.   On: 07/28/2014 21:31    PHYSICAL EXAM patient is oriented to person time or place. Affect is normal. Her husband is in the room. She is overweight. Lungs are clear. Respiratory effort is nonlabored. Cardiac exam reveals S1 and S2. The rhythm is regular.   TELEMETRY: I have reviewed telemetry today. There is dual chamber pacing.   ASSESSMENT AND PLAN:     Acute combined systolic and diastolic congestive heart failure     She is improved with diuresis and cardioversion.  The patient is stable this morning. She can be discharged home later this morning or early afternoon when her husband is able to take her home. Our team will be sure that she has early post hospital follow-up with the heart failure team.     Essential hypertension    ATRIAL FIBRILLATION, PAROXYSMAL     Patient was cardioverted yesterday and appears to be holding sinus rhythm.    OSA (obstructive sleep apnea)   CKD (chronic kidney disease) stage 3, GFR 30-59 ml/min   Hypothyroidism   Type 2 diabetes mellitus, controlled, with renal complications   Coagulopathy   Acute and chronic respiratory failure with hypoxia   Morbid obesity   Destiny [name] 07/31/2014 8:33 AM

## 2014-07-31 NOTE — Progress Notes (Addendum)
ANTICOAGULATION CONSULT NOTE - Follow Up Consult  Pharmacy Consult for Coumadin Indication: atrial fibrillation  Allergies  Allergen Reactions  . Sulfonamide Derivatives Other (See Comments)    Unknown; "childhood allergy/mother"    Patient Measurements: Height: 5\' 4"  (162.6 cm) Weight: 259 lb 0.7 oz (117.5 kg) IBW/kg (Calculated) : 54.7  Vital Signs: Temp: 97.7 F (36.5 C) (03/19 0459) Temp Source: Oral (03/19 0459) BP: 104/34 mmHg (03/19 0459) Pulse Rate: 70 (03/19 0459)  Labs:  Recent Labs  07/28/14 2048 07/29/14 0500 07/30/14 0450 07/31/14 0350  HGB 12.0 11.5*  --   --   HCT 36.6 35.4*  --   --   PLT 219 182  --   --   LABPROT 40.0* 38.3* 31.6* 28.0*  INR 4.10* 3.87* 3.03* 2.59*  CREATININE 2.02* 2.14* 2.17* 2.25*  TROPONINI  --  0.03  --   --     Estimated Creatinine Clearance: 28.9 mL/min (by C-G formula based on Cr of 2.25).   Medical History: Past Medical History  Diagnosis Date  . LBBB (left bundle branch block)   . Carotid stenosis     a. 10/19/2011 carotid duplex - Mild hard plaque bilaterally. Stable 40-59% bilateral ICA stenosis. Carotid US (04/2013):  Bilateral 40-59% ICA.  F/u 1 year  . Dyslipidemia   . Hypertension   . Chronic combined systolic and diastolic CHF (congestive heart failure)     a. EF as low as 25% in 2006;  b. EF 60-65% in 12/2011;  c. 02/2013 Echo: EF 35-40, mod mid-dist antsept HK, Gr 2 DD, mild LVH.  . Back pain, chronic     "just when I walk; mass on 3rd and 4th vertebrae right lower back"  . Cardiomyopathy, ischemic     a. 2012 s/p SJM 3231-40 Uni BiV ICD, ser # [id number].  Marland Kitchen CAD (coronary artery disease)     a. 05/2001 CABG x4: LIMA->LAD, VG->D1, VG->D2, VG->RCA.  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 CKD (chronic kidney disease), stage III   . Biventricular implantable cardiac defibrillator in situ 2007, 2012    a. 2007;  b. 2012 Gen change: SJM 3231-40 Uni BiV ICD, ser # [id number].  Marland Kitchen Hypoxemia  requiring supplemental oxygen   . Candidiasis of vulva and vagina   . Hypothyroidism   . 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 Benay Spice, in remission  . History of pneumonia 12/27/11    "3 times; it's been a long time ago"  . OSA on CPAP   . Gout ~ 08/2011  . PAF (paroxysmal atrial fibrillation)     on Coumadin  . DM (diabetes mellitus), type 2 with complications     insulin dependent, retinopathy, neuropathy  . Mural thrombus of left ventricle     before 2003, while on Coumadin, No h/o CVA  . Morbid obesity with BMI of 45.0-49.9, adult     Ht. 5'4". BMI 47.2  . Vitamin D deficiency 06/09/2012    historical  . Olecranon bursitis of right elbow 12/29/2012  . History of recurrent UTIs   . Diabetes 05/24/2013  . Hypercalcemia 09/26/2013  . Anemia, unspecified 12/13/2013  . Renal insufficiency 03/14/2014    Following with [facility], Dr Loyal Buba     Assessment: 74yo female c/o SOB w/ exertion since Sunday w/ worsening today, admitted for acute on chronic CHF w/ volume overload, to continue Coumadin for Afib;  Admit  INR 4  above goal w/ last Coumadin dose taken 3/15. High INR was likely d/t dec liver perfusion with HF exacerbation and Afib RVR   INR trend 4> 3.87> 3> 2.59  expect INR to continue to drop will try to prevent drop < 2 - given S/p DCCV 3/18 > SR  Goal of Therapy:  INR 2-3   Plan: Coumadin 10 mg x1 - prior to d/c 3/19 Then restart home dose 7.5mg  qd - 3/20 Would check INR early next week - dont want INR < 2 given DCCV 3/18  Bonnita Nasuti Pharm.D. CPP, BCPS Clinical Pharmacist [phone] 07/31/2014 8:55 AM

## 2014-07-31 NOTE — Discharge Summary (Signed)
Discharge Summary  Destiny [name] SWH:[id number] DOB: August 16, 1940  PCP: Penni Homans, MD  Admit date: 07/28/2014 Discharge date: 07/31/2014  Time spent: 25 minutes  Recommendations for Outpatient Follow-up:  1.  Cardiology CHF clinic will follow up with patient on Monday to schedule outpatient appointment in the next one week 2. At that time, her antihypertensives will be evaluated and consideration for switching Norvasc to hydralazine/nitrate be given 3. Dry weight: 259 pounds  Discharge Diagnoses:  Active Hospital Problems   Diagnosis Date Noted  . Acute combined systolic and diastolic congestive heart failure 07/28/2014  . Coagulopathy 07/29/2014  . Acute and chronic respiratory failure with hypoxia 07/29/2014  . Morbid obesity 07/29/2014  . Type 2 diabetes mellitus, controlled, with renal complications 66/59/9357  . Hypothyroidism 03/06/2013  . CKD (chronic kidney disease) stage 3, GFR 30-59 ml/min 12/27/2011  . OSA (obstructive sleep apnea) 09/05/2010  . ATRIAL FIBRILLATION, PAROXYSMAL 06/21/2009  . Essential hypertension 11/20/2006    Resolved Hospital Problems   Diagnosis Date Noted Date Resolved  No resolved problems to display.    Discharge Condition: Improved, being discharged home  Diet recommendation: Heart healthy, carb modified  Filed Weights   07/29/14 0141 07/30/14 0445 07/31/14 0459  Weight: 119.296 kg (263 lb) 117.845 kg (259 lb 12.8 oz) 117.5 kg (259 lb 0.7 oz)    History of present illness:  Patient is a 74 year old female with past medical history of atrial fibrillation on chronic anticoagulation, chronic respiratory failure on 2 L nasal cannula, diabetes mellitus and stage III chronic kidney disease, and chronic systolic/diastolic heart failure who was admitted nearly morning of 3/17 for several days of progressively worsening shortness of breath and found to be in acute heart failure.  Hospital Course:  Principal Problem:  Acute on chronic  respiratory failure with hypoxia secondary to Acute combined systolic and diastolic congestive heart failure: Patient was diuresed. Diurese several 1.5 L. Dry weight came down to 259. This was aided by cardioverting her. Felt to be now at dry weight and discharged home with outpatient follow-up with CHF clinic Active Problems:   Essential hypertension   ATRIAL FIBRILLATION, PAROXYSMAL: Patient able to rate controlled. Coagulopathy managed as below. Patient underwent cardioversion successfully on 3/18 and has been in sinus rhythm since.   OSA (obstructive sleep apnea): Patient continued her home C Pap.    CKD (chronic kidney disease) stage 3, GFR 30-59 ml/min   Hypothyroidism: Patient will be continued on Synthroid. TSH on admission stable at 1.6    Type 2 diabetes mellitus, controlled, with renal complications: CBGs overall stable. Patient will resume home medications    Coagulopathy: Patient presented with INR of 4.4 on admission, likely from decreased Coumadin clearance brought on by secondary hepatic congestion from heart failure. Following diuresis, INR continued to drop. Her Coumadin was held during that time. By day of discharge INR 2.59 and she was started back on Coumadin.    Acute and chronic respiratory failure with hypoxia   Morbid obesity: Patient meets criteria with BMI greater than 40   Consultants:  Cardiology  Procedures:  3/18: Successful cardioversion  Discharge Exam: BP 104/34 mmHg  Pulse 70  Temp(Src) 97.7 F (36.5 C) (Oral)  Resp 18  Ht 5\' 4"  (1.626 m)  Wt 117.5 kg (259 lb 0.7 oz)  BMI 44.44 kg/m2  SpO2 96%  General: Alert and oriented 3, no acute distress Cardiovascular: Regular rate and rhythm, S1-S2 Respiratory: Clear to auscultation bilaterally  Discharge Instructions You were cared for by a  hospitalist during your hospital stay. If you have any questions about your discharge medications or the care you received while you were in the hospital after  you are discharged, you can call the unit and asked to speak with the hospitalist on call if the hospitalist that took care of you is not available. Once you are discharged, your primary care physician will handle any further medical issues. Please note that NO REFILLS for any discharge medications will be authorized once you are discharged, as it is imperative that you return to your primary care physician (or establish a relationship with a primary care physician if you do not have one) for your aftercare needs so that they can reassess your need for medications and monitor your lab values.  Discharge Instructions    Diet - low sodium heart healthy    Complete by:  As directed      Increase activity slowly    Complete by:  As directed             Medication List    STOP taking these medications        traMADol 50 MG tablet  Commonly known as:  ULTRAM      TAKE these medications        albuterol 108 (90 BASE) MCG/ACT inhaler  Commonly known as:  PROVENTIL HFA;VENTOLIN HFA  Inhale 2 puffs into the lungs every 6 (six) hours as needed for wheezing or shortness of breath.     amLODipine 5 MG tablet  Commonly known as:  NORVASC  TAKE 1 TABLET EVERY DAY     atorvastatin 20 MG tablet  Commonly known as:  LIPITOR  Take 1 tablet (20 mg total) by mouth daily.     carvedilol 25 MG tablet  Commonly known as:  COREG  TAKE 1 TABLET TWICE DAILY  WITH  A  MEAL.     [employer] FISH OIL 1000 MG Caps  Take 2 capsules by mouth daily.     enalapril 2.5 MG tablet  Commonly known as:  VASOTEC  TAKE 1 TABLET TWICE DAILY.     escitalopram 10 MG tablet  Commonly known as:  LEXAPRO  TAKE 1 TABLET EVERY DAY     fenofibrate 48 MG tablet  Commonly known as:  TRICOR  Take 1 tablet (48 mg total) by mouth daily.     fluticasone 50 MCG/ACT nasal spray  Commonly known as:  FLONASE  Place 2 sprays into both nostrils daily as needed for allergies or rhinitis. As needed for nasal stuffiness.      gabapentin 300 MG capsule  Commonly known as:  NEURONTIN  TAKE 1 CAPSULE TWICE DAILY     glucose blood test strip  Commonly known as:  FREESTYLE LITE  - DX: 250.60  -   - Check sugars bid and as needed     insulin NPH Human 100 UNIT/ML injection  Commonly known as:  HUMULIN N,NOVOLIN N  Inject 20-45 Units into the skin 2 (two) times daily before a meal. Take 45 units in the morning and 20 units in the evening     insulin regular 100 units/mL injection  Commonly known as:  NOVOLIN R,HUMULIN R  Change to 15 units three times a with meals (to cover all meals--this is the same total amount but spread out)     levothyroxine 25 MCG tablet  Commonly known as:  SYNTHROID, LEVOTHROID  TAKE 1 TABLET EVERY DAY BEFORE BREAKFAST     LORazepam 0.5  MG tablet  Commonly known as:  ATIVAN  TAKE ONE TABLET BY MOUTH TWICE DAILY AS NEEDED FOR ANXIETY OR SLEEP     NON FORMULARY  Oxygen---24 hour     torsemide 20 MG tablet  Commonly known as:  DEMADEX  Take 2 tablets (40 mg total) by mouth once. may take an extra tab for 3# weight gain, swelling or SOB     Vitamin D (Ergocalciferol) 50000 UNITS Caps capsule  Commonly known as:  DRISDOL  Take 50,000 Units by mouth every 7 (seven) days.     warfarin 5 MG tablet  Commonly known as:  COUMADIN  Take 7.5 mg by mouth daily at 6 PM.       Allergies  Allergen Reactions  . Sulfonamide Derivatives Other (See Comments)    Unknown; "childhood allergy/mother"       Follow-up Information    Follow up with Glori Bickers, MD In 1 week.   Specialty:  Cardiology   Why:  We will arrange for follow-up and contact you.   Contact information:   [place] [address] [phone]       Follow up with Penni Homans, MD.   Specialty:  Family Medicine   Why:  as scheduled.   Contact information:   [place] [address] [phone]       Follow up with [facility].    Why:  They will call you next week to schedule follow-up.  if you haven't heard from them by Tues, call them to set up appt   Contact information:   [address] [place] [address]        The results of significant diagnostics from this hospitalization (including imaging, microbiology, ancillary and laboratory) are listed below for reference.    Significant Diagnostic Studies: Dg Chest Port 1 View  07/28/2014   CLINICAL DATA:  74 year old female with a history of shortness of breath for 4 days.  EXAM: PORTABLE CHEST - 1 VIEW  COMPARISON:  11/30/2013  FINDINGS: Cardiomediastinal silhouette likely unchanged in size and contour, partially screw by overlying lung and pleural disease.  Fullness in the central vasculature.  Surgical changes of prior median sternotomy and CABG.  Unchanged position of cardiac pacing device on left chest wall, with right ventricular, right atrial, and coronary sinus leads.  Mixed interstitial and airspace disease, with interlobular septal thickening. Lung bases not well evaluated.  No evidence of displaced fracture, with similar appearance of posttraumatic deformity of the right chest wall.  IMPRESSION: Evidence of congestive heart failure and pulmonary edema. Small pleural effusions cannot be excluded.  Unchanged position of cardiac pacing device/ AICD.  Signed,  Dulcy Fanny. Earleen Newport, DO  [facility]  [facility]   Electronically Signed   By: Corrie Mckusick D.O.   On: 07/28/2014 21:31    Microbiology: No results found for this or any previous visit (from the past 240 hour(s)).   Labs: Basic Metabolic Panel:  Recent Labs Lab 07/28/14 1000 07/28/14 2048 07/29/14 0500 07/30/14 0450 07/31/14 0350  NA 139 139 138 138 134*  K 4.8 4.7 4.6 4.7 3.7  CL 104 104 102 99 95*  CO2 28 25 26 27 29   GLUCOSE 116* 107* 183* 100* 159*  BUN 44* 43* 44* 56* 68*  CREATININE 2.15* 2.02* 2.14* 2.17* 2.25*    CALCIUM 8.8 8.7 8.8 9.4 8.6   Liver  Function Tests:  Recent Labs Lab 07/29/14 0500  AST 14  ALT 12  ALKPHOS 57  BILITOT 0.8  PROT 6.7  ALBUMIN 3.4*   No results for input(s): LIPASE, AMYLASE in the last 168 hours. No results for input(s): AMMONIA in the last 168 hours. CBC:  Recent Labs Lab 07/28/14 2048 07/29/14 0500  WBC 14.7* 10.2  NEUTROABS  --  6.7  HGB 12.0 11.5*  HCT 36.6 35.4*  MCV 90.4 91.7  PLT 219 182   Cardiac Enzymes:  Recent Labs Lab 07/29/14 0500  TROPONINI 0.03   BNP: BNP (last 3 results)  Recent Labs  07/15/14 1025 07/28/14 1000 07/28/14 2048  BNP 188.8* 171.5* 353.9*    ProBNP (last 3 results)  Recent Labs  09/07/13 1050  PROBNP 983.0*    CBG:  Recent Labs Lab 07/30/14 1235 07/30/14 1702 07/30/14 2127 07/31/14 0609 07/31/14 1119  GLUCAP 156* 275* 264* 129* 260*       Signed:  Brexton Sofia K  [facility] 07/31/2014, 3:15 PM

## 2014-08-02 ENCOUNTER — Encounter (HOSPITAL_COMMUNITY): Payer: Self-pay | Admitting: Internal Medicine

## 2014-08-02 ENCOUNTER — Telehealth: Payer: Self-pay | Admitting: *Deleted

## 2014-08-02 NOTE — Telephone Encounter (Signed)
Patient only willing to see Dr. Charlett Blake for TCM follow-up.  (No open spots) Please advise. eal

## 2014-08-02 NOTE — Telephone Encounter (Signed)
Transition Care Management Follow-up Telephone Call  How have you been since you were released from the hospital?  No concerns, no shortness of breath, lightheadedness, or chest palpitation- patient states she is feeling very well   Do you understand why you were in the hospital? YES    Do you understand the discharge instrcutions? YES  Items Reviewed:  Medications reviewed: YES  Allergies reviewed: YES  Dietary changes reviewed: Low-Sodium Diet (Heart Healthy)       Referrals reviewed: YES- patient notified to make cardiology appointment as well, verbalized understanding  Functional Questionnaire:   Activities of Daily Living (ADLs):   She states they are independent in the following: Uses walker for ambulation, bathing/dressing independently  States they require assistance with the following: none   Any transportation issues/concerns?: NO- husband will bring   Any patient concerns? NONE   Confirmed importance and date/time of follow-up visits scheduled: PATIENT ONLY WILLING TO SEE DR. Charlett Blake   Confirmed with patient if condition begins to worsen call PCP or go to the ER.  Patient was given the Call-a-Nurse line [phone]: YES

## 2014-08-02 NOTE — Telephone Encounter (Signed)
Offered patient appointment tomorrow morning but patient declined.  She states she will see Dr. Charlett Blake soon 08/31/14 for CPE and that she does not want another visit before this.  Patient states she will make appointment with Dr. Lovena Le within two weeks.

## 2014-08-03 ENCOUNTER — Encounter: Payer: Self-pay | Admitting: Internal Medicine

## 2014-08-04 DIAGNOSIS — I129 Hypertensive chronic kidney disease with stage 1 through stage 4 chronic kidney disease, or unspecified chronic kidney disease: Secondary | ICD-10-CM | POA: Diagnosis not present

## 2014-08-04 DIAGNOSIS — N2581 Secondary hyperparathyroidism of renal origin: Secondary | ICD-10-CM | POA: Diagnosis not present

## 2014-08-04 DIAGNOSIS — N183 Chronic kidney disease, stage 3 (moderate): Secondary | ICD-10-CM | POA: Diagnosis not present

## 2014-08-04 DIAGNOSIS — D638 Anemia in other chronic diseases classified elsewhere: Secondary | ICD-10-CM | POA: Diagnosis not present

## 2014-08-10 ENCOUNTER — Telehealth: Payer: Self-pay | Admitting: Family Medicine

## 2014-08-10 NOTE — Telephone Encounter (Signed)
Pre visit letter sent  °

## 2014-08-12 ENCOUNTER — Ambulatory Visit (INDEPENDENT_AMBULATORY_CARE_PROVIDER_SITE_OTHER): Payer: Medicare Other | Admitting: *Deleted

## 2014-08-12 DIAGNOSIS — I482 Chronic atrial fibrillation, unspecified: Secondary | ICD-10-CM

## 2014-08-12 DIAGNOSIS — I6350 Cerebral infarction due to unspecified occlusion or stenosis of unspecified cerebral artery: Secondary | ICD-10-CM

## 2014-08-12 DIAGNOSIS — I639 Cerebral infarction, unspecified: Secondary | ICD-10-CM

## 2014-08-12 DIAGNOSIS — Z5181 Encounter for therapeutic drug level monitoring: Secondary | ICD-10-CM | POA: Diagnosis not present

## 2014-08-12 DIAGNOSIS — I4891 Unspecified atrial fibrillation: Secondary | ICD-10-CM | POA: Diagnosis not present

## 2014-08-12 DIAGNOSIS — I480 Paroxysmal atrial fibrillation: Secondary | ICD-10-CM

## 2014-08-12 LAB — PROTIME-INR
INR: 6.4 ratio — AB (ref 0.8–1.0)
PROTHROMBIN TIME: 67.2 s — AB (ref 9.6–13.1)

## 2014-08-12 LAB — POCT INR: INR: 7.2

## 2014-08-16 ENCOUNTER — Ambulatory Visit (INDEPENDENT_AMBULATORY_CARE_PROVIDER_SITE_OTHER): Payer: Medicare Other

## 2014-08-16 DIAGNOSIS — I639 Cerebral infarction, unspecified: Secondary | ICD-10-CM

## 2014-08-16 DIAGNOSIS — Z5181 Encounter for therapeutic drug level monitoring: Secondary | ICD-10-CM

## 2014-08-16 DIAGNOSIS — I4891 Unspecified atrial fibrillation: Secondary | ICD-10-CM | POA: Diagnosis not present

## 2014-08-16 DIAGNOSIS — I6350 Cerebral infarction due to unspecified occlusion or stenosis of unspecified cerebral artery: Secondary | ICD-10-CM

## 2014-08-16 DIAGNOSIS — I482 Chronic atrial fibrillation, unspecified: Secondary | ICD-10-CM

## 2014-08-16 LAB — POCT INR: INR: 1.6

## 2014-08-19 ENCOUNTER — Other Ambulatory Visit: Payer: Self-pay

## 2014-08-19 MED ORDER — ATORVASTATIN CALCIUM 20 MG PO TABS: 20.0000 mg | ORAL_TABLET | Freq: Every day | ORAL | Status: DC

## 2014-08-23 DIAGNOSIS — E11329 Type 2 diabetes mellitus with mild nonproliferative diabetic retinopathy without macular edema: Secondary | ICD-10-CM | POA: Diagnosis not present

## 2014-08-23 DIAGNOSIS — Z961 Presence of intraocular lens: Secondary | ICD-10-CM | POA: Diagnosis not present

## 2014-08-23 LAB — HM DIABETES EYE EXAM

## 2014-08-24 ENCOUNTER — Ambulatory Visit (INDEPENDENT_AMBULATORY_CARE_PROVIDER_SITE_OTHER): Payer: Medicare Other

## 2014-08-24 ENCOUNTER — Encounter: Payer: Self-pay | Admitting: Family Medicine

## 2014-08-24 DIAGNOSIS — I4891 Unspecified atrial fibrillation: Secondary | ICD-10-CM

## 2014-08-24 DIAGNOSIS — I639 Cerebral infarction, unspecified: Secondary | ICD-10-CM | POA: Diagnosis not present

## 2014-08-24 DIAGNOSIS — I482 Chronic atrial fibrillation, unspecified: Secondary | ICD-10-CM

## 2014-08-24 DIAGNOSIS — E780 Pure hypercholesterolemia: Secondary | ICD-10-CM | POA: Diagnosis not present

## 2014-08-24 DIAGNOSIS — E039 Hypothyroidism, unspecified: Secondary | ICD-10-CM | POA: Diagnosis not present

## 2014-08-24 DIAGNOSIS — Z5181 Encounter for therapeutic drug level monitoring: Secondary | ICD-10-CM

## 2014-08-24 DIAGNOSIS — I6350 Cerebral infarction due to unspecified occlusion or stenosis of unspecified cerebral artery: Secondary | ICD-10-CM

## 2014-08-24 DIAGNOSIS — E1165 Type 2 diabetes mellitus with hyperglycemia: Secondary | ICD-10-CM | POA: Diagnosis not present

## 2014-08-24 LAB — POCT INR: INR: 3.4

## 2014-08-25 ENCOUNTER — Other Ambulatory Visit: Payer: Self-pay | Admitting: Family Medicine

## 2014-08-26 NOTE — Telephone Encounter (Signed)
Last appt. 03/08/14 Upcoming appt. 08/31/14 Last refill 07/14/14 #45 with 0 refills.

## 2014-08-26 NOTE — Telephone Encounter (Signed)
Faxed hardcopy for Lorazepam  #[phone] with 0 refills to [facility].

## 2014-08-29 ENCOUNTER — Other Ambulatory Visit: Payer: Self-pay | Admitting: Family Medicine

## 2014-08-30 ENCOUNTER — Telehealth: Payer: Self-pay | Admitting: *Deleted

## 2014-08-30 ENCOUNTER — Encounter: Payer: Self-pay | Admitting: *Deleted

## 2014-08-30 NOTE — Telephone Encounter (Signed)
Pre-Visit Call completed with patient and chart updated.   Pre-Visit Info documented in Specialty Comments under [employer].    

## 2014-08-30 NOTE — Telephone Encounter (Signed)
Received fax from pharmacy this am 08/30/14 stating did not received fax sent in on 08/26/14.  Called the pharmacy and gave a verbal refill for Lorazepam that was faxed in on 08/26/14.

## 2014-08-31 ENCOUNTER — Encounter: Payer: Self-pay | Admitting: Family Medicine

## 2014-08-31 ENCOUNTER — Ambulatory Visit (INDEPENDENT_AMBULATORY_CARE_PROVIDER_SITE_OTHER): Payer: Medicare Other | Admitting: Family Medicine

## 2014-08-31 VITALS — BP 118/56 | HR 71 | Temp 98.0°F | Ht 64.0 in | Wt 263.1 lb

## 2014-08-31 DIAGNOSIS — G4733 Obstructive sleep apnea (adult) (pediatric): Secondary | ICD-10-CM

## 2014-08-31 DIAGNOSIS — E038 Other specified hypothyroidism: Secondary | ICD-10-CM

## 2014-08-31 DIAGNOSIS — I255 Ischemic cardiomyopathy: Secondary | ICD-10-CM | POA: Diagnosis not present

## 2014-08-31 DIAGNOSIS — C8590 Non-Hodgkin lymphoma, unspecified, unspecified site: Secondary | ICD-10-CM

## 2014-08-31 DIAGNOSIS — I5042 Chronic combined systolic (congestive) and diastolic (congestive) heart failure: Secondary | ICD-10-CM | POA: Diagnosis not present

## 2014-08-31 DIAGNOSIS — I10 Essential (primary) hypertension: Secondary | ICD-10-CM

## 2014-08-31 DIAGNOSIS — I639 Cerebral infarction, unspecified: Secondary | ICD-10-CM

## 2014-08-31 DIAGNOSIS — E785 Hyperlipidemia, unspecified: Secondary | ICD-10-CM

## 2014-08-31 DIAGNOSIS — N058 Unspecified nephritic syndrome with other morphologic changes: Secondary | ICD-10-CM

## 2014-08-31 DIAGNOSIS — E1129 Type 2 diabetes mellitus with other diabetic kidney complication: Secondary | ICD-10-CM

## 2014-08-31 DIAGNOSIS — I480 Paroxysmal atrial fibrillation: Secondary | ICD-10-CM

## 2014-08-31 DIAGNOSIS — Z0000 Encounter for general adult medical examination without abnormal findings: Secondary | ICD-10-CM | POA: Diagnosis not present

## 2014-08-31 MED ORDER — GABAPENTIN 300 MG PO CAPS: 300.0000 mg | ORAL_CAPSULE | Freq: Two times a day (BID) | ORAL | Status: DC

## 2014-08-31 MED ORDER — LEVOTHYROXINE SODIUM 25 MCG PO TABS: ORAL_TABLET | ORAL | Status: DC

## 2014-08-31 MED ORDER — AMLODIPINE BESYLATE 5 MG PO TABS: 5.0000 mg | ORAL_TABLET | Freq: Every day | ORAL | Status: DC

## 2014-08-31 MED ORDER — ESCITALOPRAM OXALATE 10 MG PO TABS: 10.0000 mg | ORAL_TABLET | Freq: Every day | ORAL | Status: DC

## 2014-08-31 MED ORDER — ATORVASTATIN CALCIUM 20 MG PO TABS: 20.0000 mg | ORAL_TABLET | Freq: Every day | ORAL | Status: DC

## 2014-08-31 MED ORDER — LORAZEPAM 0.5 MG PO TABS: 0.5000 mg | ORAL_TABLET | Freq: Every evening | ORAL | Status: DC | PRN

## 2014-08-31 NOTE — Progress Notes (Signed)
Pre visit review using our clinic review tool, if applicable. No additional management support is needed unless otherwise documented below in the visit note. 

## 2014-08-31 NOTE — Patient Instructions (Signed)
Rel of [facility] Dr Gillian Scarce recent eye visit at [place] of [facility] nephrologist, Dr Loyal Buba, last office visit, last set of labs    Preventive Care for Adults A healthy lifestyle and preventive care can promote health and wellness. Preventive health guidelines for women include the following key practices.  A routine yearly physical is a good way to check with your health care provider about your health and preventive screening. It is a chance to share any concerns and updates on your health and to receive a thorough exam.  Visit your dentist for a routine exam and preventive care every 6 months. Brush your teeth twice a day and floss once a day. Good oral hygiene prevents tooth decay and gum disease.  The frequency of eye exams is based on your age, health, family medical history, use of contact lenses, and other factors. Follow your health care provider's recommendations for frequency of eye exams.  Eat a healthy diet. Foods like vegetables, fruits, whole grains, low-fat dairy products, and lean protein foods contain the nutrients you need without too many calories. Decrease your intake of foods high in solid fats, added sugars, and salt. Eat the right amount of calories for you.Get information about a proper diet from your health care provider, if necessary.  Regular physical exercise is one of the most important things you can do for your health. Most adults should get at least 150 minutes of moderate-intensity exercise (any activity that increases your heart rate and causes you to sweat) each week. In addition, most adults need muscle-strengthening exercises on 2 or more days a week.  Maintain a healthy weight. The body mass index (BMI) is a screening tool to identify possible weight problems. It provides an estimate of body fat based on height and weight. Your health care provider can find your BMI and can help you achieve or maintain a healthy weight.For adults 20 years and older:  A BMI  below 18.5 is considered underweight.  A BMI of 18.5 to 24.9 is normal.  A BMI of 25 to 29.9 is considered overweight.  A BMI of 30 and above is considered obese.  Maintain normal blood lipids and cholesterol levels by exercising and minimizing your intake of saturated fat. Eat a balanced diet with plenty of fruit and vegetables. Blood tests for lipids and cholesterol should begin at age 35 and be repeated every 5 years. If your lipid or cholesterol levels are high, you are over 50, or you are at high risk for heart disease, you may need your cholesterol levels checked more frequently.Ongoing high lipid and cholesterol levels should be treated with medicines if diet and exercise are not working.  If you smoke, find out from your health care provider how to quit. If you do not use tobacco, do not start.  Lung cancer screening is recommended for adults aged 9-80 years who are at high risk for developing lung cancer because of a history of smoking. A yearly low-dose CT scan of the lungs is recommended for people who have at least a 30-pack-year history of smoking and are a current smoker or have quit within the past 15 years. A pack year of smoking is smoking an average of 1 pack of cigarettes a day for 1 year (for example: 1 pack a day for 30 years or 2 packs a day for 15 years). Yearly screening should continue until the smoker has stopped smoking for at least 15 years. Yearly screening should be stopped for people  who develop a health problem that would prevent them from having lung cancer treatment.  If you are pregnant, do not drink alcohol. If you are breastfeeding, be very cautious about drinking alcohol. If you are not pregnant and choose to drink alcohol, do not have more than 1 drink per day. One drink is considered to be 12 ounces (355 mL) of beer, 5 ounces (148 mL) of wine, or 1.5 ounces (44 mL) of liquor.  Avoid use of street drugs. Do not share needles with anyone. Ask for help if you  need support or instructions about stopping the use of drugs.  High blood pressure causes heart disease and increases the risk of stroke. Your blood pressure should be checked at least every 1 to 2 years. Ongoing high blood pressure should be treated with medicines if weight loss and exercise do not work.  If you are 22-60 years old, ask your health care provider if you should take aspirin to prevent strokes.  Diabetes screening involves taking a blood sample to check your fasting blood sugar level. This should be done once every 3 years, after age 38, if you are within normal weight and without risk factors for diabetes. Testing should be considered at a younger age or be carried out more frequently if you are overweight and have at least 1 risk factor for diabetes.  Breast cancer screening is essential preventive care for women. You should practice "breast self-awareness." This means understanding the normal appearance and feel of your breasts and may include breast self-examination. Any changes detected, no matter how small, should be reported to a health care provider. Women in their 7s and 30s should have a clinical breast exam (CBE) by a health care provider as part of a regular health exam every 1 to 3 years. After age 83, women should have a CBE every year. Starting at age 49, women should consider having a mammogram (breast X-ray test) every year. Women who have a family history of breast cancer should talk to their health care provider about genetic screening. Women at a high risk of breast cancer should talk to their health care providers about having an MRI and a mammogram every year.  Breast cancer gene (BRCA)-related cancer risk assessment is recommended for women who have family members with BRCA-related cancers. BRCA-related cancers include breast, ovarian, tubal, and peritoneal cancers. Having family members with these cancers may be associated with an increased risk for harmful changes  (mutations) in the breast cancer genes BRCA1 and BRCA2. Results of the assessment will determine the need for genetic counseling and BRCA1 and BRCA2 testing.  Routine pelvic exams to screen for cancer are no longer recommended for nonpregnant women who are considered low risk for cancer of the pelvic organs (ovaries, uterus, and vagina) and who do not have symptoms. Ask your health care provider if a screening pelvic exam is right for you.  If you have had past treatment for cervical cancer or a condition that could lead to cancer, you need Pap tests and screening for cancer for at least 20 years after your treatment. If Pap tests have been discontinued, your risk factors (such as having a new sexual partner) need to be reassessed to determine if screening should be resumed. Some women have medical problems that increase the chance of getting cervical cancer. In these cases, your health care provider may recommend more frequent screening and Pap tests.  The HPV test is an additional test that may be used for cervical  cancer screening. The HPV test looks for the virus that can cause the cell changes on the cervix. The cells collected during the Pap test can be tested for HPV. The HPV test could be used to screen women aged 70 years and older, and should be used in women of any age who have unclear Pap test results. After the age of 74, women should have HPV testing at the same frequency as a Pap test.  Colorectal cancer can be detected and often prevented. Most routine colorectal cancer screening begins at the age of 52 years and continues through age 49 years. However, your health care provider may recommend screening at an earlier age if you have risk factors for colon cancer. On a yearly basis, your health care provider may provide home test kits to check for hidden blood in the stool. Use of a small camera at the end of a tube, to directly examine the colon (sigmoidoscopy or colonoscopy), can detect the  earliest forms of colorectal cancer. Talk to your health care provider about this at age 53, when routine screening begins. Direct exam of the colon should be repeated every 5-10 years through age 39 years, unless early forms of pre-cancerous polyps or small growths are found.  People who are at an increased risk for hepatitis B should be screened for this virus. You are considered at high risk for hepatitis B if:  You were born in a country where hepatitis B occurs often. Talk with your health care provider about which countries are considered high risk.  Your parents were born in a high-risk country and you have not received a shot to protect against hepatitis B (hepatitis B vaccine).  You have HIV or AIDS.  You use needles to inject street drugs.  You live with, or have sex with, someone who has hepatitis B.  You get hemodialysis treatment.  You take certain medicines for conditions like cancer, organ transplantation, and autoimmune conditions.  Hepatitis C blood testing is recommended for all people born from [birth year] through 1963 and any individual with known risks for hepatitis C.  Practice safe sex. Use condoms and avoid high-risk sexual practices to reduce the spread of sexually transmitted infections (STIs). STIs include gonorrhea, chlamydia, syphilis, trichomonas, herpes, HPV, and human immunodeficiency virus (HIV). Herpes, HIV, and HPV are viral illnesses that have no cure. They can result in disability, cancer, and death.  You should be screened for sexually transmitted illnesses (STIs) including gonorrhea and chlamydia if:  You are sexually active and are younger than 24 years.  You are older than 24 years and your health care provider tells you that you are at risk for this type of infection.  Your sexual activity has changed since you were last screened and you are at an increased risk for chlamydia or gonorrhea. Ask your health care provider if you are at risk.  If you are  at risk of being infected with HIV, it is recommended that you take a prescription medicine daily to prevent HIV infection. This is called preexposure prophylaxis (PrEP). You are considered at risk if:  You are a heterosexual woman, are sexually active, and are at increased risk for HIV infection.  You take drugs by injection.  You are sexually active with a partner who has HIV.  Talk with your health care provider about whether you are at high risk of being infected with HIV. If you choose to begin PrEP, you should first be tested for HIV. You should  then be tested every 3 months for as long as you are taking PrEP.  Osteoporosis is a disease in which the bones lose minerals and strength with aging. This can result in serious bone fractures or breaks. The risk of osteoporosis can be identified using a bone density scan. Women ages 48 years and over and women at risk for fractures or osteoporosis should discuss screening with their health care providers. Ask your health care provider whether you should take a calcium supplement or vitamin D to reduce the rate of osteoporosis.  Menopause can be associated with physical symptoms and risks. Hormone replacement therapy is available to decrease symptoms and risks. You should talk to your health care provider about whether hormone replacement therapy is right for you.  Use sunscreen. Apply sunscreen liberally and repeatedly throughout the day. You should seek shade when your shadow is shorter than you. Protect yourself by wearing long sleeves, pants, a wide-brimmed hat, and sunglasses year round, whenever you are outdoors.  Once a month, do a whole body skin exam, using a mirror to look at the skin on your back. Tell your health care provider of new moles, moles that have irregular borders, moles that are larger than a pencil eraser, or moles that have changed in shape or color.  Stay current with required vaccines (immunizations).  Influenza vaccine.  All adults should be immunized every year.  Tetanus, diphtheria, and acellular pertussis (Td, Tdap) vaccine. Pregnant women should receive 1 dose of Tdap vaccine during each pregnancy. The dose should be obtained regardless of the length of time since the last dose. Immunization is preferred during the 27th-36th week of gestation. An adult who has not previously received Tdap or who does not know her vaccine status should receive 1 dose of Tdap. This initial dose should be followed by tetanus and diphtheria toxoids (Td) booster doses every 10 years. Adults with an unknown or incomplete history of completing a 3-dose immunization series with Td-containing vaccines should begin or complete a primary immunization series including a Tdap dose. Adults should receive a Td booster every 10 years.  Varicella vaccine. An adult without evidence of immunity to varicella should receive 2 doses or a second dose if she has previously received 1 dose. Pregnant females who do not have evidence of immunity should receive the first dose after pregnancy. This first dose should be obtained before leaving the health care facility. The second dose should be obtained 4-8 weeks after the first dose.  Human papillomavirus (HPV) vaccine. Females aged 13-26 years who have not received the vaccine previously should obtain the 3-dose series. The vaccine is not recommended for use in pregnant females. However, pregnancy testing is not needed before receiving a dose. If a female is found to be pregnant after receiving a dose, no treatment is needed. In that case, the remaining doses should be delayed until after the pregnancy. Immunization is recommended for any person with an immunocompromised condition through the age of 52 years if she did not get any or all doses earlier. During the 3-dose series, the second dose should be obtained 4-8 weeks after the first dose. The third dose should be obtained 24 weeks after the first dose and 16  weeks after the second dose.  Zoster vaccine. One dose is recommended for adults aged 1 years or older unless certain conditions are present.  Measles, mumps, and rubella (MMR) vaccine. Adults born before [birth year] generally are considered immune to measles and mumps. Adults born in [birth year]  or later should have 1 or more doses of MMR vaccine unless there is a contraindication to the vaccine or there is laboratory evidence of immunity to each of the three diseases. A routine second dose of MMR vaccine should be obtained at least 28 days after the first dose for students attending postsecondary schools, health care workers, or international travelers. People who received inactivated measles vaccine or an unknown type of measles vaccine during 1963-1967 should receive 2 doses of MMR vaccine. People who received inactivated mumps vaccine or an unknown type of mumps vaccine before 1979 and are at high risk for mumps infection should consider immunization with 2 doses of MMR vaccine. For females of childbearing age, rubella immunity should be determined. If there is no evidence of immunity, females who are not pregnant should be vaccinated. If there is no evidence of immunity, females who are pregnant should delay immunization until after pregnancy. Unvaccinated health care workers born before [birth year] who lack laboratory evidence of measles, mumps, or rubella immunity or laboratory confirmation of disease should consider measles and mumps immunization with 2 doses of MMR vaccine or rubella immunization with 1 dose of MMR vaccine.  Pneumococcal 13-valent conjugate (PCV13) vaccine. When indicated, a person who is uncertain of her immunization history and has no record of immunization should receive the PCV13 vaccine. An adult aged 74 years or older who has certain medical conditions and has not been previously immunized should receive 1 dose of PCV13 vaccine. This PCV13 should be followed with a dose of pneumococcal  polysaccharide (PPSV23) vaccine. The PPSV23 vaccine dose should be obtained at least 8 weeks after the dose of PCV13 vaccine. An adult aged 38 years or older who has certain medical conditions and previously received 1 or more doses of PPSV23 vaccine should receive 1 dose of PCV13. The PCV13 vaccine dose should be obtained 1 or more years after the last PPSV23 vaccine dose.  Pneumococcal polysaccharide (PPSV23) vaccine. When PCV13 is also indicated, PCV13 should be obtained first. All adults aged 49 years and older should be immunized. An adult younger than age 91 years who has certain medical conditions should be immunized. Any person who resides in a nursing home or long-term care facility should be immunized. An adult smoker should be immunized. People with an immunocompromised condition and certain other conditions should receive both PCV13 and PPSV23 vaccines. People with human immunodeficiency virus (HIV) infection should be immunized as soon as possible after diagnosis. Immunization during chemotherapy or radiation therapy should be avoided. Routine use of PPSV23 vaccine is not recommended for American Indians, [place]atives, or people younger than 65 years unless there are medical conditions that require PPSV23 vaccine. When indicated, people who have unknown immunization and have no record of immunization should receive PPSV23 vaccine. One-time revaccination 5 years after the first dose of PPSV23 is recommended for people aged 19-64 years who have chronic kidney failure, nephrotic syndrome, asplenia, or immunocompromised conditions. People who received 1-2 doses of PPSV23 before age 74 years should receive another dose of PPSV23 vaccine at age 81 years or later if at least 5 years have passed since the previous dose. Doses of PPSV23 are not needed for people immunized with PPSV23 at or after age 24 years.  Meningococcal vaccine. Adults with asplenia or persistent complement component deficiencies  should receive 2 doses of quadrivalent meningococcal conjugate (MenACWY-D) vaccine. The doses should be obtained at least 2 months apart. Microbiologists working with certain meningococcal bacteria, [employer] recruits, people at risk during  an outbreak, and people who travel to or live in countries with a high rate of meningitis should be immunized. A first-year college student up through age 40 years who is living in a residence hall should receive a dose if she did not receive a dose on or after her 16th birthday. Adults who have certain high-risk conditions should receive one or more doses of vaccine.  Hepatitis A vaccine. Adults who wish to be protected from this disease, have certain high-risk conditions, work with hepatitis A-infected animals, work in hepatitis A research labs, or travel to or work in countries with a high rate of hepatitis A should be immunized. Adults who were previously unvaccinated and who anticipate close contact with an international adoptee during the first 60 days after arrival in the [place] from a country with a high rate of hepatitis A should be immunized.  Hepatitis B vaccine. Adults who wish to be protected from this disease, have certain high-risk conditions, may be exposed to blood or other infectious body fluids, are household contacts or sex partners of hepatitis B positive people, are clients or workers in certain care facilities, or travel to or work in countries with a high rate of hepatitis B should be immunized.  Haemophilus influenzae type b (Hib) vaccine. A previously unvaccinated person with asplenia or sickle cell disease or having a scheduled splenectomy should receive 1 dose of Hib vaccine. Regardless of previous immunization, a recipient of a hematopoietic stem cell transplant should receive a 3-dose series 6-12 months after her successful transplant. Hib vaccine is not recommended for adults with HIV infection. Preventive Services / Frequency Ages 53  to 31 years  Blood pressure check.** / Every 1 to 2 years.  Lipid and cholesterol check.** / Every 5 years beginning at age 57.  Clinical breast exam.** / Every 3 years for women in their 43s and 26s.  BRCA-related cancer risk assessment.** / For women who have family members with a BRCA-related cancer (breast, ovarian, tubal, or peritoneal cancers).  Pap test.** / Every 2 years from ages 57 through 54. Every 3 years starting at age 67 through age 5 or 38 with a history of 3 consecutive normal Pap tests.  HPV screening.** / Every 3 years from ages 45 through ages 39 to 35 with a history of 3 consecutive normal Pap tests.  Hepatitis C blood test.** / For any individual with known risks for hepatitis C.  Skin self-exam. / Monthly.  Influenza vaccine. / Every year.  Tetanus, diphtheria, and acellular pertussis (Tdap, Td) vaccine.** / Consult your health care provider. Pregnant women should receive 1 dose of Tdap vaccine during each pregnancy. 1 dose of Td every 10 years.  Varicella vaccine.** / Consult your health care provider. Pregnant females who do not have evidence of immunity should receive the first dose after pregnancy.  HPV vaccine. / 3 doses over 6 months, if 26 and younger. The vaccine is not recommended for use in pregnant females. However, pregnancy testing is not needed before receiving a dose.  Measles, mumps, rubella (MMR) vaccine.** / You need at least 1 dose of MMR if you were born in 1957 or later. You may also need a 2nd dose. For females of childbearing age, rubella immunity should be determined. If there is no evidence of immunity, females who are not pregnant should be vaccinated. If there is no evidence of immunity, females who are pregnant should delay immunization until after pregnancy.  Pneumococcal 13-valent conjugate (PCV13) vaccine.** / Consult  your health care provider.  Pneumococcal polysaccharide (PPSV23) vaccine.** / 1 to 2 doses if you smoke cigarettes  or if you have certain conditions.  Meningococcal vaccine.** / 1 dose if you are age 44 to 69 years and a Market researcher living in a residence hall, or have one of several medical conditions, you need to get vaccinated against meningococcal disease. You may also need additional booster doses.  Hepatitis A vaccine.** / Consult your health care provider.  Hepatitis B vaccine.** / Consult your health care provider.  Haemophilus influenzae type b (Hib) vaccine.** / Consult your health care provider. Ages 66 to 93 years  Blood pressure check.** / Every 1 to 2 years.  Lipid and cholesterol check.** / Every 5 years beginning at age 57 years.  Lung cancer screening. / Every year if you are aged 21-80 years and have a 30-pack-year history of smoking and currently smoke or have quit within the past 15 years. Yearly screening is stopped once you have quit smoking for at least 15 years or develop a health problem that would prevent you from having lung cancer treatment.  Clinical breast exam.** / Every year after age 69 years.  BRCA-related cancer risk assessment.** / For women who have family members with a BRCA-related cancer (breast, ovarian, tubal, or peritoneal cancers).  Mammogram.** / Every year beginning at age 78 years and continuing for as long as you are in good health. Consult with your health care provider.  Pap test.** / Every 3 years starting at age 63 years through age 42 or 105 years with a history of 3 consecutive normal Pap tests.  HPV screening.** / Every 3 years from ages 5 years through ages 58 to 37 years with a history of 3 consecutive normal Pap tests.  Fecal occult blood test (FOBT) of stool. / Every year beginning at age 52 years and continuing until age 60 years. You may not need to do this test if you get a colonoscopy every 10 years.  Flexible sigmoidoscopy or colonoscopy.** / Every 5 years for a flexible sigmoidoscopy or every 10 years for a colonoscopy  beginning at age 80 years and continuing until age 66 years.  Hepatitis C blood test.** / For all people born from [birth year] through 1963 and any individual with known risks for hepatitis C.  Skin self-exam. / Monthly.  Influenza vaccine. / Every year.  Tetanus, diphtheria, and acellular pertussis (Tdap/Td) vaccine.** / Consult your health care provider. Pregnant women should receive 1 dose of Tdap vaccine during each pregnancy. 1 dose of Td every 10 years.  Varicella vaccine.** / Consult your health care provider. Pregnant females who do not have evidence of immunity should receive the first dose after pregnancy.  Zoster vaccine.** / 1 dose for adults aged 20 years or older.  Measles, mumps, rubella (MMR) vaccine.** / You need at least 1 dose of MMR if you were born in 1955 or later. You may also need a 2nd dose. For females of childbearing age, rubella immunity should be determined. If there is no evidence of immunity, females who are not pregnant should be vaccinated. If there is no evidence of immunity, females who are pregnant should delay immunization until after pregnancy.  Pneumococcal 13-valent conjugate (PCV13) vaccine.** / Consult your health care provider.  Pneumococcal polysaccharide (PPSV23) vaccine.** / 1 to 2 doses if you smoke cigarettes or if you have certain conditions.  Meningococcal vaccine.** / Consult your health care provider.  Hepatitis A vaccine.** / Consult your  health care provider.  Hepatitis B vaccine.** / Consult your health care provider.  Haemophilus influenzae type b (Hib) vaccine.** / Consult your health care provider. Ages 63 years and over  Blood pressure check.** / Every 1 to 2 years.  Lipid and cholesterol check.** / Every 5 years beginning at age 26 years.  Lung cancer screening. / Every year if you are aged 32-80 years and have a 30-pack-year history of smoking and currently smoke or have quit within the past 15 years. Yearly screening is stopped  once you have quit smoking for at least 15 years or develop a health problem that would prevent you from having lung cancer treatment.  Clinical breast exam.** / Every year after age 13 years.  BRCA-related cancer risk assessment.** / For women who have family members with a BRCA-related cancer (breast, ovarian, tubal, or peritoneal cancers).  Mammogram.** / Every year beginning at age 75 years and continuing for as long as you are in good health. Consult with your health care provider.  Pap test.** / Every 3 years starting at age 79 years through age 74 or 79 years with 3 consecutive normal Pap tests. Testing can be stopped between 65 and 70 years with 3 consecutive normal Pap tests and no abnormal Pap or HPV tests in the past 10 years.  HPV screening.** / Every 3 years from ages 71 years through ages 30 or 45 years with a history of 3 consecutive normal Pap tests. Testing can be stopped between 65 and 70 years with 3 consecutive normal Pap tests and no abnormal Pap or HPV tests in the past 10 years.  Fecal occult blood test (FOBT) of stool. / Every year beginning at age 76 years and continuing until age 63 years. You may not need to do this test if you get a colonoscopy every 10 years.  Flexible sigmoidoscopy or colonoscopy.** / Every 5 years for a flexible sigmoidoscopy or every 10 years for a colonoscopy beginning at age 3 years and continuing until age 59 years.  Hepatitis C blood test.** / For all people born from [birth year] through 1963 and any individual with known risks for hepatitis C.  Osteoporosis screening.** / A one-time screening for women ages 61 years and over and women at risk for fractures or osteoporosis.  Skin self-exam. / Monthly.  Influenza vaccine. / Every year.  Tetanus, diphtheria, and acellular pertussis (Tdap/Td) vaccine.** / 1 dose of Td every 10 years.  Varicella vaccine.** / Consult your health care provider.  Zoster vaccine.** / 1 dose for adults aged 45 years  or older.  Pneumococcal 13-valent conjugate (PCV13) vaccine.** / Consult your health care provider.  Pneumococcal polysaccharide (PPSV23) vaccine.** / 1 dose for all adults aged 44 years and older.  Meningococcal vaccine.** / Consult your health care provider.  Hepatitis A vaccine.** / Consult your health care provider.  Hepatitis B vaccine.** / Consult your health care provider.  Haemophilus influenzae type b (Hib) vaccine.** / Consult your health care provider. ** Family history and personal history of risk and conditions may change your health care provider's recommendations. Document Released: 06/26/2001 Document Revised: 09/14/2013 Document Reviewed: 09/25/2010 [facility] Patient Information 2015 [place], [place]. This information is not intended to replace advice given to you by your health care provider. Make sure you discuss any questions you have with your health care provider.

## 2014-09-01 DIAGNOSIS — E1165 Type 2 diabetes mellitus with hyperglycemia: Secondary | ICD-10-CM | POA: Diagnosis not present

## 2014-09-01 DIAGNOSIS — E039 Hypothyroidism, unspecified: Secondary | ICD-10-CM | POA: Diagnosis not present

## 2014-09-01 DIAGNOSIS — I10 Essential (primary) hypertension: Secondary | ICD-10-CM | POA: Diagnosis not present

## 2014-09-01 DIAGNOSIS — E780 Pure hypercholesterolemia: Secondary | ICD-10-CM | POA: Diagnosis not present

## 2014-09-02 ENCOUNTER — Encounter: Payer: Self-pay | Admitting: Cardiology

## 2014-09-02 ENCOUNTER — Ambulatory Visit (INDEPENDENT_AMBULATORY_CARE_PROVIDER_SITE_OTHER): Payer: Medicare Other | Admitting: *Deleted

## 2014-09-02 ENCOUNTER — Ambulatory Visit (INDEPENDENT_AMBULATORY_CARE_PROVIDER_SITE_OTHER): Payer: Medicare Other | Admitting: Cardiology

## 2014-09-02 VITALS — BP 124/60 | HR 74 | Ht 64.0 in | Wt 264.8 lb

## 2014-09-02 DIAGNOSIS — I4891 Unspecified atrial fibrillation: Secondary | ICD-10-CM

## 2014-09-02 DIAGNOSIS — I482 Chronic atrial fibrillation, unspecified: Secondary | ICD-10-CM

## 2014-09-02 DIAGNOSIS — Z5181 Encounter for therapeutic drug level monitoring: Secondary | ICD-10-CM

## 2014-09-02 DIAGNOSIS — I639 Cerebral infarction, unspecified: Secondary | ICD-10-CM

## 2014-09-02 DIAGNOSIS — I480 Paroxysmal atrial fibrillation: Secondary | ICD-10-CM

## 2014-09-02 DIAGNOSIS — I6350 Cerebral infarction due to unspecified occlusion or stenosis of unspecified cerebral artery: Secondary | ICD-10-CM

## 2014-09-02 LAB — POCT INR: INR: 3

## 2014-09-02 NOTE — Patient Instructions (Signed)
Medication Instructions:  None  Labwork: None  Testing/Procedures: None  Follow-Up: As already ordered  Any Other Special Instructions Will Be Listed Below (If Applicable).

## 2014-09-02 NOTE — Progress Notes (Signed)
09/02/2014 Destiny [name]   [birth year]  [id number]  Primary Physician Penni Homans, MD Primary Cardiologist: Dr. Aundra Dubin Electrophysiologist: Dr. Lovena Le  Reason for Visit/CC: [facility] F/U for Acute on Chronic Combined Systolic + Diastolic CHF and Atrial Fibrillation.    HPI:  The patient is a 74 y/o female, followed by Dr. Aundra Dubin, with a PMH of CAD s/p CABG '03, systolic/diastolic heart failure with [employer] BIVICD, EF of 40% on ehco 08/2013, dyslipidemia, chronic back pain, morbid obesity, T2DM, atrial fibrillation and OSA on CPAP. She is also on chronic home O2 (2L [place]).   She presents to clinic today for post hospital f/u after recent admission at [facility]. She initally presented with complaints of progressive fatigue and increased dyspnea. She was found to be in acute CHF, secondary to combined systolic + diastolic dysfunction. She was treated with IV Lasix. She had good diuresis and symptoms improved. She was transitioned back to PO torsemide. BB and ACE-I were continued.   Her ICD was also interrogated and showed several weeks of AF. She was continued on Warfarin. INRs were therapeutic. She underwent successful DCCV by Dr. Haroldine Laws on 3/18. She remained in NSR throughout the remainder of her hospitalization. She was discharged home on 07/31/14.   She presents back for post hospital f/u. She states she has done well since discharge. No further dyspnea beyond her baseline. She has not had to increase her chronic home O2 beyond 2L. She has been compliant with daily weights and low sodium diet. No weight gain, LEE, orthopnea or PND. She denies CP. No symptoms of breakthrough atrial fibrillation. She reports full compliance with warfarin and all other meds. No abnormal bleeding. INR was checked today and is therapeutic at 3.0.      Current Outpatient Prescriptions  Medication Sig Dispense Refill  . albuterol (PROVENTIL HFA;VENTOLIN HFA) 108 (90 BASE) MCG/ACT inhaler Inhale 2 puffs into the  lungs every 6 (six) hours as needed for wheezing or shortness of breath. 1 Inhaler 1  . amLODipine (NORVASC) 5 MG tablet Take 1 tablet (5 mg total) by mouth daily. 90 tablet 1  . atorvastatin (LIPITOR) 20 MG tablet Take 1 tablet (20 mg total) by mouth daily. 90 tablet 1  . carvedilol (COREG) 25 MG tablet TAKE 1 TABLET TWICE DAILY  WITH  A  MEAL. 180 tablet 6  . enalapril (VASOTEC) 2.5 MG tablet TAKE 1 TABLET TWICE DAILY. 180 tablet 6  . escitalopram (LEXAPRO) 10 MG tablet Take 1 tablet (10 mg total) by mouth daily. 90 tablet 1  . fenofibrate (TRICOR) 48 MG tablet Take 1 tablet (48 mg total) by mouth daily. 90 tablet 3  . fluticasone (FLONASE) 50 MCG/ACT nasal spray Place 2 sprays into both nostrils daily as needed for allergies or rhinitis. As needed for nasal stuffiness. 16 g 0  . gabapentin (NEURONTIN) 300 MG capsule Take 1 capsule (300 mg total) by mouth 2 (two) times daily. 180 capsule 1  . glucose blood (FREESTYLE LITE) test strip DX: 250.60  Check sugars bid and as needed 100 each 2  . insulin NPH (HUMULIN N,NOVOLIN N) 100 UNIT/ML injection Inject 20-45 Units into the skin 2 (two) times daily before a meal. Take 45 units in the morning and 20 units in the evening    . insulin regular (NOVOLIN R,HUMULIN R) 100 units/mL injection Change to 15 units three times a with meals (to cover all meals--this is the same total amount but spread out) (Patient taking differently: Inject  15 Units into the skin 2 (two) times daily before a meal. Change to 15 units three times a with meals (to cover all meals--this is the same total amount but spread out)) 10 mL 12  . levothyroxine (SYNTHROID, LEVOTHROID) 25 MCG tablet TAKE 1 TABLET EVERY DAY BEFORE BREAKFAST 90 tablet 1  . LORazepam (ATIVAN) 0.5 MG tablet Take 1 tablet (0.5 mg total) by mouth at bedtime as needed for anxiety. 90 tablet 1  . NON FORMULARY Oxygen---24 hour    . Omega-3 Fatty Acids ([employer] FISH OIL) 1000 MG CAPS Take 2 capsules by mouth daily. 60  capsule 3  . torsemide (DEMADEX) 20 MG tablet Take 2 tablets (40 mg total) by mouth once. may take an extra tab for 3# weight gain, swelling or SOB (Patient taking differently: Take 40 mg by mouth once. may take an extra tab for 3# weight gain, swelling or SOB Takes one in the morning and one at 3:30) 135 tablet 3  . Vitamin D, Ergocalciferol, (DRISDOL) 50000 UNITS CAPS capsule Take 50,000 Units by mouth every 7 (seven) days.    Marland Kitchen warfarin (COUMADIN) 5 MG tablet Take 7.5 mg by mouth daily at 6 PM.     No current facility-administered medications for this visit.    Allergies  Allergen Reactions  . Sulfonamide Derivatives Other (See Comments)    Unknown; "childhood allergy/mother"    History   Social History  . Marital Status: Married    Spouse Name: N/A  . Number of Children: Y  . Years of Education: N/A   Occupational History  . 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    Social History Main Topics  . Smoking status: Never Smoker   . Smokeless tobacco: Never Used  . Alcohol Use: No  . Drug Use: No  . Sexual Activity: Not Currently     Comment: lives with husband, no major dietary restrictions   Other Topics Concern  . Not on file   Social History Narrative   Lives in [place] with her husband.  She does not routinely exercise or adhere to any particular diet.      Family History  Problem Relation Age of Onset  . Kidney cancer Mother     kidney and female repo - died @ 27  . Asthma Mother   . Cancer Mother     gyn and renal  . Heart disease Mother     CAD  . Heart disease Father     died @ 89  . Stroke Father   . Diabetes Father   . Hypertension Father   . Hyperlipidemia Father   . Hypertension Son   . Hypertension Maternal Aunt   . Kidney cancer Maternal Uncle   . Cancer Maternal Uncle   . Cirrhosis Maternal Grandmother     non alcohol  . Cancer Maternal Grandfather   . Kidney cancer Maternal Grandfather      Review of Systems: General: negative for  chills, fever, night sweats or weight changes.  Cardiovascular: negative for chest pain, dyspnea on exertion, edema, orthopnea, palpitations, paroxysmal nocturnal dyspnea or shortness of breath Dermatological: negative for rash Respiratory: negative for cough or wheezing Urologic: negative for hematuria Abdominal: negative for nausea, vomiting, diarrhea, bright red blood per rectum, melena, or hematemesis Neurologic: negative for visual changes, syncope, or dizziness All other systems reviewed and are otherwise negative except as noted above.    Blood pressure 124/60, pulse 74, height 5\' 4"  (1.626  m), weight 264 lb 12.8 oz (120.112 kg).  General appearance: alert, cooperative, no distress and moderately obese Neck: no carotid bruit and no JVD Lungs: clear to auscultation bilaterally Heart: regular rate and rhythm, S1, S2 normal, no murmur, click, rub or gallop Extremities: no LEE Pulses: 2+ and symmetric Skin: warm and dry Neurologic: Grossly normal  EKG NSR. 74 bpm.   ASSESSMENT AND PLAN:    1. Chronic Combined Systolic + Diastolic HF: stable. Euvoelmic on physical exam. No increased dyspnea beyond her baseline (on chronic home O2). BP well controlled. Continue daily PO toresimide, BB and ACE-I. Patient reminded to continue daily weights and low sodium diet.   2. PAF: s/p sucessful DCCV 07/30/14. Maintaing NSR. No symptoms of breakthrough afib. Continue BB for rate control. Continue Coumadin for a/c.  3. Chronic Coumadin Therapy: INR is therapuetic today at 3.0.  Goal is 2-3. Dosing instructions and f/u provided by pharmacist.   4. CAD: s/p CABG in 2003. Stable w/o angina. Continue medical therapy. ASA, statin, BB + ACE-I.  5. HTN: well controlled on current regimen. Coreg, lisinopril and amlodipine.   6: HLD: continue statin therapy with Lipitor.   7: ICD: Followed by Dr. Lovena Le. F/u scheduled for 09/29/14  8: OSA: Compliant with CPAP therapy. Continue.    PLAN  Patient is  stable and doing well post hospitalization. Continue current plan of care. No changes made. She has been instructed to keep f/u appointment with Dr. Aundra Dubin on 09/09/14 and EP f/u with Dr. Lovena Le for her ICD on 09/29/14.   Neidra Girvan, BRITTAINYPA-C 09/02/2014 9:55 AM

## 2014-09-07 ENCOUNTER — Encounter: Payer: Self-pay | Admitting: Family Medicine

## 2014-09-09 ENCOUNTER — Ambulatory Visit (HOSPITAL_COMMUNITY)
Admission: RE | Admit: 2014-09-09 | Discharge: 2014-09-09 | Disposition: A | Discharge status: Home / Self Care | Payer: Medicare Other | Source: Ambulatory Visit | Attending: Internal Medicine | Admitting: Internal Medicine

## 2014-09-09 VITALS — BP 142/60 | HR 72 | Wt 258.8 lb

## 2014-09-09 DIAGNOSIS — I5022 Chronic systolic (congestive) heart failure: Secondary | ICD-10-CM | POA: Diagnosis not present

## 2014-09-09 DIAGNOSIS — I480 Paroxysmal atrial fibrillation: Secondary | ICD-10-CM | POA: Diagnosis not present

## 2014-09-09 DIAGNOSIS — Z951 Presence of aortocoronary bypass graft: Secondary | ICD-10-CM | POA: Insufficient documentation

## 2014-09-09 DIAGNOSIS — N183 Chronic kidney disease, stage 3 unspecified: Secondary | ICD-10-CM

## 2014-09-09 DIAGNOSIS — Z7901 Long term (current) use of anticoagulants: Secondary | ICD-10-CM | POA: Insufficient documentation

## 2014-09-09 DIAGNOSIS — E785 Hyperlipidemia, unspecified: Secondary | ICD-10-CM | POA: Diagnosis not present

## 2014-09-09 DIAGNOSIS — I5043 Acute on chronic combined systolic (congestive) and diastolic (congestive) heart failure: Secondary | ICD-10-CM

## 2014-09-09 DIAGNOSIS — G4733 Obstructive sleep apnea (adult) (pediatric): Secondary | ICD-10-CM | POA: Diagnosis not present

## 2014-09-09 DIAGNOSIS — I255 Ischemic cardiomyopathy: Secondary | ICD-10-CM | POA: Insufficient documentation

## 2014-09-09 DIAGNOSIS — I5042 Chronic combined systolic (congestive) and diastolic (congestive) heart failure: Secondary | ICD-10-CM | POA: Diagnosis not present

## 2014-09-09 DIAGNOSIS — Z794 Long term (current) use of insulin: Secondary | ICD-10-CM | POA: Diagnosis not present

## 2014-09-09 DIAGNOSIS — I2510 Atherosclerotic heart disease of native coronary artery without angina pectoris: Secondary | ICD-10-CM | POA: Insufficient documentation

## 2014-09-09 DIAGNOSIS — I6529 Occlusion and stenosis of unspecified carotid artery: Secondary | ICD-10-CM | POA: Insufficient documentation

## 2014-09-09 DIAGNOSIS — I129 Hypertensive chronic kidney disease with stage 1 through stage 4 chronic kidney disease, or unspecified chronic kidney disease: Secondary | ICD-10-CM | POA: Insufficient documentation

## 2014-09-09 NOTE — Progress Notes (Signed)
Patient ID: ARNELLE NALE, female   DOB: 06/06/1940, 74 y.o.   MRN: [id number] PCP: Dr Charlett Blake Nephrologist: Dr Marval Regal EP: Dr Lovena Le  Pulmonary: Dr Gwenette Greet  HPI: Destiny [name] is a 74 y.o. female with a history of CAD, status post CABG in 2003, ischemic cardiomyopathy with variable ejection fractions noted in the past (echo in 12/2011 demonstrated normal LV function with an EF 60-65%), status post CRT-D, chronic combined systolic and diastolic CHF, paroxysmal atrial fibrillation, prior LV mural thrombus, on chronic Coumadin therapy, CKD, T2DM. She has a prior admission to the hospital for acute renal failure in the setting of rhabdomyolysis (while taking colchicine and fenofibrate). Carotid US (12/14): Bilateral ICA stenosis 40-59%.    She was admitted 10/23-11/11/14 with acute on chronic combined systolic and diastolic CHF. ECHO with worsening LV function with an EF of 35-40%. Hospital stay was complicated by a/c renal failure and respiratory failure. Had short term intubation. She was made a DNR/DNI.  Discharge weight 256 lbs. She is on home oxygen 2L by nasal cannula and CPAP at night, OHS/OSA.   She returns for follow up.  Overall feeling pretty good. Ongoing mild dyspnea. Wears 2 liters [place] oxygen. Using CPAP nightly.  Rides in motorized scooter. Weight at home 256-259 pounds. Taking all medications. Tries to follow low salt diet and limit salt intake. Uses a walker in the house. She is not taking atorvastatin currently as she had myalgias when it was increased to 40 mg daily.  She is not on Tricor as it was too expensive.   ECHO (03/06/13): EF 35-40% ECHO (09/07/13): EF 40%, grade II diastolic dysfunction, mildly decreased RV systolic function with normal RV size.  Carotid dopplers (1/16) with 40-59% bilateral ICA stenosis.   Labs  05/01/13 K 5.4 Creatinine 1.46 Pro BNP 646  05/19/13 K 5.0 Creatinine 1.38 3/15 LDL 41, HDL 38, creatinine 1.7 4/15 K 5.2, creatinine 1.8 09/16/13 K 4.6 Creatinine  1.6 10/15 K 5.2, creatinine 1.6, LDL 71, HDL 33, TGs 425, TSH normal 12/15 LDL 65, TGs 337  ROS: All systems negative except as listed in HPI, PMH and Problem List.  Past Medical History  Diagnosis Date  . LBBB (left bundle branch block)   . Carotid stenosis     a. 10/19/2011 carotid duplex - Mild hard plaque bilaterally. Stable 40-59% bilateral ICA stenosis. Carotid US (04/2013):  Bilateral 40-59% ICA.  F/u 1 year  . Dyslipidemia   . Hypertension   . Chronic combined systolic and diastolic CHF (congestive heart failure)     a. EF as low as 25% in 2006;  b. EF 60-65% in 12/2011;  c. 02/2013 Echo: EF 35-40, mod mid-dist antsept HK, Gr 2 DD, mild LVH.  . Back pain, chronic     "just when I walk; mass on 3rd and 4th vertebrae right lower back"  . Cardiomyopathy, ischemic     a. 2012 s/p SJM 3231-40 Uni BiV ICD, ser # [id number].  Marland Kitchen CAD (coronary artery disease)     a. 05/2001 CABG x4: LIMA->LAD, VG->D1, VG->D2, VG->RCA.  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 CKD (chronic kidney disease), stage III   . Biventricular implantable cardiac defibrillator in situ 2007, 2012    a. 2007;  b. 2012 Gen change: SJM 3231-40 Uni BiV ICD, ser # [id number].  Marland Kitchen Hypoxemia requiring supplemental oxygen   . Candidiasis of vulva and vagina   . Hypothyroidism   . 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 Benay Spice, in remission  . History of pneumonia 12/27/11    "3 times; it's been a long time ago"  . OSA on CPAP   . Gout ~ 08/2011  . PAF (paroxysmal atrial fibrillation)     on Coumadin  . DM (diabetes mellitus), type 2 with complications     insulin dependent, retinopathy, neuropathy  . Mural thrombus of left ventricle     before 2003, while on Coumadin, No h/o CVA  . Morbid obesity with BMI of 45.0-49.9, adult     Ht. 5'4". BMI 47.2  . Vitamin D deficiency 06/09/2012    historical  . Olecranon bursitis of right elbow 12/29/2012  . History of  recurrent UTIs   . Diabetes 05/24/2013  . Hypercalcemia 09/26/2013  . Anemia, unspecified 12/13/2013  . Renal insufficiency 03/14/2014    Following with [facility], Dr Loyal Buba    Current Outpatient Prescriptions  Medication Sig Dispense Refill  . albuterol (PROVENTIL HFA;VENTOLIN HFA) 108 (90 BASE) MCG/ACT inhaler Inhale 2 puffs into the lungs every 6 (six) hours as needed for wheezing or shortness of breath. 1 Inhaler 1  . amLODipine (NORVASC) 5 MG tablet Take 1 tablet (5 mg total) by mouth daily. 90 tablet 1  . atorvastatin (LIPITOR) 20 MG tablet Take 1 tablet (20 mg total) by mouth daily. 90 tablet 1  . carvedilol (COREG) 25 MG tablet TAKE 1 TABLET TWICE DAILY  WITH  A  MEAL. 180 tablet 6  . enalapril (VASOTEC) 2.5 MG tablet TAKE 1 TABLET TWICE DAILY. 180 tablet 6  . escitalopram (LEXAPRO) 10 MG tablet Take 1 tablet (10 mg total) by mouth daily. 90 tablet 1  . fenofibrate (TRICOR) 48 MG tablet Take 1 tablet (48 mg total) by mouth daily. 90 tablet 3  . fluticasone (FLONASE) 50 MCG/ACT nasal spray Place 2 sprays into both nostrils daily as needed for allergies or rhinitis. As needed for nasal stuffiness. 16 g 0  . gabapentin (NEURONTIN) 300 MG capsule Take 1 capsule (300 mg total) by mouth 2 (two) times daily. 180 capsule 1  . glucose blood (FREESTYLE LITE) test strip DX: 250.60  Check sugars bid and as needed 100 each 2  . insulin NPH (HUMULIN N,NOVOLIN N) 100 UNIT/ML injection Inject 20-45 Units into the skin 2 (two) times daily before a meal. Take 45 units in the morning and 20 units in the evening    . insulin regular (NOVOLIN R,HUMULIN R) 100 units/mL injection Change to 15 units three times a with meals (to cover all meals--this is the same total amount but spread out) (Patient taking differently: Inject 15 Units into the skin 2 (two) times daily before a meal. Change to 15 units three times a with meals (to cover all meals--this is the same total amount but spread out)) 10 mL 12  .  levothyroxine (SYNTHROID, LEVOTHROID) 25 MCG tablet TAKE 1 TABLET EVERY DAY BEFORE BREAKFAST 90 tablet 1  . LORazepam (ATIVAN) 0.5 MG tablet Take 1 tablet (0.5 mg total) by mouth at bedtime as needed for anxiety. 90 tablet 1  . NON FORMULARY Oxygen---24 hour    . Omega-3 Fatty Acids ([employer]S FISH OIL) 1000 MG CAPS Take 2 capsules by mouth daily. 60 capsule 3  . torsemide (DEMADEX) 20 MG tablet Take 2 tablets (40 mg total) by mouth once. may take an extra tab for 3# weight gain, swelling or SOB (Patient taking differently: Take 40 mg by  mouth once. may take an extra tab for 3# weight gain, swelling or SOB Takes one in the morning and one at 3:30) 135 tablet 3  . Vitamin D, Ergocalciferol, (DRISDOL) 50000 UNITS CAPS capsule Take 50,000 Units by mouth every 7 (seven) days.    Marland Kitchen warfarin (COUMADIN) 5 MG tablet Take 7.5 mg by mouth daily at 6 PM.     No current facility-administered medications for this encounter.    Filed Vitals:   09/09/14 0943  BP: 142/60  Pulse: 72  Weight: 258 lb 12.8 oz (117.391 kg)  SpO2: 94%    PHYSICAL EXAM: General:  Obese. No resp difficulty; on 2 L continuous. Husband present . Arrived in scooter.  HEENT: normal Neck: supple. JVP 6-7 cm. Carotids 2+ bilaterally; no bruits. No lymphadenopathy or thryomegaly appreciated. Cor: PMI normal. Regular rate & rhythm. No rubs, gallops.  1/6 early SEM RUSB. Lungs: clear Abdomen: Obese, soft, nontender, obese, nondistended. No hepatosplenomegaly. No bruits or masses. Good bowel sounds. Extremities: no cyanosis, clubbing, rash. No edema.  Neuro: alert & orientedx3, cranial nerves grossly intact. Moves all 4 extremities w/o difficulty. Affect pleasant.  ASSESSMENT & PLAN:  1) Chronic systolic HF: Ischemic cardiomyopathy s/p CRT-D (St. Jude). Echo 08/2013: EF is about 40%.  NYHA III symptoms but seems more limited by hip and low back.  Volume status stable. Continue torsemide to 20 mg twice a day with an extra 20 mg as needed.   - Continue current Coreg 25 mg twice a day.  - Continue enalapril to 2.5 mg daily will not increase with CKD.  - No spironolactone for given CKD.  - May need to consider hydralazine/imdur next visit.   - Reinforced the need and importance of daily weights, a low sodium diet, and fluid restriction (less than 2 L a day). Instructed to call the HF clinic if weight increases more than 3 lbs overnight or 5 lbs in a week. 2) PAF: S/P DC-CV 07/28/14.Regular rhythm.  Continue BB and Coumadin. No bleeding problems. 3) OHS/OSA: Continue nightly CPAP and home oxygen.  4) CAD: s/p CABG.  No chest pain.  She is not on ASA given comadin use and stable CAD. Restart statin => she can tolerate atorvastatin 20 but not 40 due to myalgias.  5) Hyperlipidemia: Restart atorvastatin 20 mg daily (she has been able to tolerate this dose). Triglycerides very high, continue fenofibrate 48 mg daily.  6) Carotid stenosis: Repeat dopplers in 1/17.  7) CKD. Stage III:  Followed by Dr Marval Regal.   Follow up in 6 weeks with an ECHO    CLEGG,AMY NP-C  09/09/2014

## 2014-09-09 NOTE — Patient Instructions (Signed)
Follow up 6 weeks with echocardiogram.  Do the following things EVERYDAY: 1) Weigh yourself in the morning before breakfast. Write it down and keep it in a log. 2) Take your medicines as prescribed 3) Eat low salt foods-Limit salt (sodium) to 2000 mg per day.  4) Stay as active as you can everyday 5) Limit all fluids for the day to less than 2 liters

## 2014-09-12 ENCOUNTER — Encounter: Payer: Self-pay | Admitting: Family Medicine

## 2014-09-12 DIAGNOSIS — Z0000 Encounter for general adult medical examination without abnormal findings: Secondary | ICD-10-CM | POA: Insufficient documentation

## 2014-09-12 NOTE — Assessment & Plan Note (Signed)
On Levothyroxine, follows with Dr Chalmers Cater

## 2014-09-12 NOTE — Assessment & Plan Note (Signed)
Encouraged DASH diet, decrease po intake and increase exercise as tolerated. Needs 7-8 hours of sleep nightly. Avoid trans fats, eat small, frequent meals every 4-5 hours with lean proteins, complex carbs and healthy fats. Minimize simple carbs, GMO foods. 

## 2014-09-12 NOTE — Assessment & Plan Note (Addendum)
Was admitted to the hospital in March 2016 with an acute exacerbation. Is feeling much better and has had follow up with cardiology. No changes today

## 2014-09-12 NOTE — Assessment & Plan Note (Signed)
Well controlled, no changes to meds. Encouraged heart healthy diet such as the DASH diet and exercise as tolerated.  °

## 2014-09-12 NOTE — Assessment & Plan Note (Signed)
Using CPAP routinely 

## 2014-09-12 NOTE — Assessment & Plan Note (Signed)
Patient denies any difficulties at home. No trouble with ADLs, depression or falls. No recent changes to vision or hearing. Is UTD with immunizations. Is UTD with screening. Discussed Advanced Directives, patient agrees to bring [place] copies of documents if can. Encouraged heart healthy diet, exercise as tolerated and adequate sleep. Medication: Reviewed with patient and updated  Preferred Pharmacy and which med where: 90 day supply/mail order: [place] [facility]: [facility]  DM supplies: [facility]   Allergies verified: UTD   Immunization Status: Flu vaccine-- 01/12/14 Tdap-- 01/28/12 PNA-- 03/11/13 (23)  Shingles--08/19/07  A/P:  Changes to [place], [place] or Personal Hx: UTD  Pap-- none found  MMG-- 06/25/08 with Johnnette Gourd, MD at [place] [facility]: Benign Bone Density-- none found  CCS--declined  Eye- 08/23/14 - positive retinopathy, recommended to continue to monitor.  Declines paps   Care Teams Updated: Danton Sewer, MD - Pulmonology, Cristopher Peru, MD- pacemaker, Loralie Champagne, MD- CHF, Donato Heinz, MD- Nephrology   ED/Hospital/Urgent Care Visits: Patient was in hospital 08/02/14 for CHF exacerbation with cardioversion- TCM call made but patient did not want appt, states she is doing much better   To Discuss with Provider: No concerns at this time See problem list for patient risk factors See AVS for preventative healthcare recommendations

## 2014-09-12 NOTE — Assessment & Plan Note (Signed)
Required cardioversion in March but feeling well today

## 2014-09-12 NOTE — Assessment & Plan Note (Signed)
Tolerating statin, encouraged heart healthy diet, avoid trans fats, minimize simple carbs and saturated fats. Increase exercise as tolerated 

## 2014-09-12 NOTE — Progress Notes (Signed)
Destiny [name]  [id number] [birth year] 09/12/2014      Progress Note-Follow Up  Subjective  Chief Complaint  Chief Complaint  Patient presents with  . Annual Exam    Medicare    HPI  Patient is a 74 y.o. female in today for routine medical care. Patient is in today accompanied by her husband for annual exam. She is doing much better. Last month she was in the hospital for cardioversion due to atrial fibrillation and congestive heart failure but since coming home has had no further complications. Continues to struggle with chronic health conditions but overall says she is actually doing very well. She follows with oncology, nephrology, endocrinology, cardiology, pulmonology, ophthalmology as well as others. No recent fevers or febrile illness. Denies CP/palp/SOB/HA/congestion/fevers/GI or GU c/o. Taking meds as prescribed. Continues to be very sedentary due to her numerous health concerns. Is trying to eat a heart healthy diet but struggles  Past Medical History  Diagnosis Date  . LBBB (left bundle branch block)   . Carotid stenosis     a. 10/19/2011 carotid duplex - Mild hard plaque bilaterally. Stable 40-59% bilateral ICA stenosis. Carotid US (04/2013):  Bilateral 40-59% ICA.  F/u 1 year  . Dyslipidemia   . Hypertension   . Chronic combined systolic and diastolic CHF (congestive heart failure)     a. EF as low as 25% in 2006;  b. EF 60-65% in 12/2011;  c. 02/2013 Echo: EF 35-40, mod mid-dist antsept HK, Gr 2 DD, mild LVH.  . Back pain, chronic     "just when I walk; mass on 3rd and 4th vertebrae right lower back"  . Cardiomyopathy, ischemic     a. 2012 s/p SJM 3231-40 Uni BiV ICD, ser # [id number].  Marland Kitchen CAD (coronary artery disease)     a. 05/2001 CABG x4: LIMA->LAD, VG->D1, VG->D2, VG->RCA.  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 CKD (chronic kidney disease), stage III   . Biventricular implantable cardiac defibrillator in situ 2007, 2012    a. 2007;  b.  2012 Gen change: SJM 3231-40 Uni BiV ICD, ser # [id number].  Marland Kitchen Hypoxemia requiring supplemental oxygen   . Candidiasis of vulva and vagina   . Hypothyroidism   . 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 Benay Spice, in remission  . History of pneumonia 12/27/11    "3 times; it's been a long time ago"  . OSA on CPAP   . Gout ~ 08/2011  . PAF (paroxysmal atrial fibrillation)     on Coumadin  . DM (diabetes mellitus), type 2 with complications     insulin dependent, retinopathy, neuropathy  . Mural thrombus of left ventricle     before 2003, while on Coumadin, No h/o CVA  . Morbid obesity with BMI of 45.0-49.9, adult     Ht. 5'4". BMI 47.2  . Vitamin D deficiency 06/09/2012    historical  . Olecranon bursitis of right elbow 12/29/2012  . History of recurrent UTIs   . Diabetes 05/24/2013  . Hypercalcemia 09/26/2013  . Anemia, unspecified 12/13/2013  . Renal insufficiency 03/14/2014    Following with [facility], Dr Loyal Buba  . Medicare annual wellness visit, subsequent 09/12/2014    Medication: Reviewed with patient and updated  Preferred Pharmacy and which med where: 90 day supply/mail order: [place] [facility]: [facility]  DM supplies: [facility]   Allergies verified: UTD  Immunization Status: Flu vaccine-- 01/12/14 Tdap-- 01/28/12 PNA-- 03/11/13 (23)  Shingles--08/19/07  MMG-- 06/25/08 with Johnnette Gourd, MD at [facility]    Past Surgical History  Procedure Laterality Date  . Cardiac catheterization  06/03/01  . Cholecystectomy  1970  . Tubal ligation  1972  . Abdominal hysterectomy  1982  . Insert / replace / remove pacemaker  2007; 2012    w/AICD  . Coronary artery bypass graft  2003    CABG X4  . Cataract extraction      left eye  . Cardioversion N/A 07/30/2014    Procedure: CARDIOVERSION;  Surgeon: Jolaine Artist, MD;  Location: [facility] ENDOSCOPY;  Service: Cardiovascular;  Laterality: N/A;  .  Refractive surgery      right eye    Family History  Problem Relation Age of Onset  . Kidney cancer Mother     kidney and female repo - died @ 4  . Asthma Mother   . Cancer Mother     gyn and renal  . Heart disease Mother     CAD  . Heart disease Father     died @ 51  . Stroke Father   . Diabetes Father   . Hypertension Father   . Hyperlipidemia Father   . Hypertension Son   . Hypertension Maternal Aunt   . Kidney cancer Maternal Uncle   . Cancer Maternal Uncle   . Cirrhosis Maternal Grandmother     non alcohol  . Cancer Maternal Grandfather   . Kidney cancer Maternal Grandfather     History   Social History  . Marital Status: Married    Spouse Name: N/A  . Number of Children: Y  . Years of Education: N/A   Occupational History  . 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    Social History Main Topics  . Smoking status: Never Smoker   . Smokeless tobacco: Never Used  . Alcohol Use: No  . Drug Use: No  . Sexual Activity: Not Currently     Comment: lives with husband, no major dietary restrictions   Other Topics Concern  . Not on file   Social History Narrative   Lives in [place] with her husband.  She does not routinely exercise or adhere to any particular diet.      Current Outpatient Prescriptions on File Prior to Visit  Medication Sig Dispense Refill  . albuterol (PROVENTIL HFA;VENTOLIN HFA) 108 (90 BASE) MCG/ACT inhaler Inhale 2 puffs into the lungs every 6 (six) hours as needed for wheezing or shortness of breath. 1 Inhaler 1  . carvedilol (COREG) 25 MG tablet TAKE 1 TABLET TWICE DAILY  WITH  A  MEAL. 180 tablet 6  . enalapril (VASOTEC) 2.5 MG tablet TAKE 1 TABLET TWICE DAILY. 180 tablet 6  . fenofibrate (TRICOR) 48 MG tablet Take 1 tablet (48 mg total) by mouth daily. 90 tablet 3  . fluticasone (FLONASE) 50 MCG/ACT nasal spray Place 2 sprays into both nostrils daily as needed for allergies or rhinitis. As needed for nasal stuffiness. 16 g 0  .  glucose blood (FREESTYLE LITE) test strip DX: 250.60  Check sugars bid and as needed 100 each 2  . insulin NPH (HUMULIN N,NOVOLIN N) 100 UNIT/ML injection Inject 20-45 Units into the skin 2 (two) times daily before a meal. Take 45 units in the morning and 20 units in the evening    . insulin regular (NOVOLIN R,HUMULIN R) 100 units/mL injection Change  to 15 units three times a with meals (to cover all meals--this is the same total amount but spread out) (Patient taking differently: Inject 15 Units into the skin 2 (two) times daily before a meal. Change to 15 units three times a with meals (to cover all meals--this is the same total amount but spread out)) 10 mL 12  . NON FORMULARY Oxygen---24 hour    . Omega-3 Fatty Acids ([employer] FISH OIL) 1000 MG CAPS Take 2 capsules by mouth daily. 60 capsule 3  . torsemide (DEMADEX) 20 MG tablet Take 2 tablets (40 mg total) by mouth once. may take an extra tab for 3# weight gain, swelling or SOB (Patient taking differently: Take 40 mg by mouth once. may take an extra tab for 3# weight gain, swelling or SOB Takes one in the morning and one at 3:30) 135 tablet 3  . Vitamin D, Ergocalciferol, (DRISDOL) 50000 UNITS CAPS capsule Take 50,000 Units by mouth every 7 (seven) days.    Marland Kitchen warfarin (COUMADIN) 5 MG tablet Take 7.5 mg by mouth daily at 6 PM.     No current facility-administered medications on file prior to visit.    Allergies  Allergen Reactions  . Sulfonamide Derivatives Other (See Comments)    Unknown; "childhood allergy/mother"    Review of Systems  Review of Systems  Constitutional: Positive for malaise/fatigue. Negative for fever and chills.  HENT: Negative for congestion, hearing loss and nosebleeds.   Eyes: Negative for discharge.  Respiratory: Negative for cough, sputum production, shortness of breath and wheezing.   Cardiovascular: Negative for chest pain, palpitations and leg swelling.  Gastrointestinal: Negative for heartburn, nausea,  vomiting, abdominal pain, diarrhea, constipation and blood in stool.  Genitourinary: Negative for dysuria, urgency, frequency and hematuria.  Musculoskeletal: Positive for back pain and joint pain. Negative for myalgias and falls.  Skin: Negative for rash.  Neurological: Positive for weakness. Negative for dizziness, tremors, sensory change, focal weakness, loss of consciousness and headaches.  Endo/Heme/Allergies: Negative for polydipsia. Does not bruise/bleed easily.  Psychiatric/Behavioral: Negative for depression and suicidal ideas. The patient is not nervous/anxious and does not have insomnia.     Objective  BP 118/56 mmHg  Pulse 71  Temp(Src) 98 F (36.7 C) (Oral)  Ht 5\' 4"  (1.626 m)  Wt 263 lb 2 oz (119.353 kg)  BMI 45.14 kg/m2  SpO2 93%  Physical Exam  Physical Exam  Constitutional: She is oriented to person, place, and time and well-developed, well-nourished, and in no distress. No distress.  HENT:  Head: Normocephalic and atraumatic.  Right Ear: External ear normal.  Left Ear: External ear normal.  Nose: Nose normal.  Mouth/Throat: Oropharynx is clear and moist. No oropharyngeal exudate.  Eyes: Conjunctivae are normal. Pupils are equal, round, and reactive to light. Right eye exhibits no discharge. Left eye exhibits no discharge. No scleral icterus.  Neck: Normal range of motion. Neck supple. No thyromegaly present.  Cardiovascular: Normal rate and intact distal pulses.   Murmur heard. Irregular   Pulmonary/Chest: Effort normal and breath sounds normal. No respiratory distress. She has no wheezes. She has no rales.  Abdominal: Soft. Bowel sounds are normal. She exhibits no distension and no mass. There is no tenderness.  Musculoskeletal: Normal range of motion. She exhibits no edema or tenderness.  Lymphadenopathy:    She has no cervical adenopathy.  Neurological: She is alert and oriented to person, place, and time. She has normal reflexes. No cranial nerve deficit.  Coordination normal.  Skin: Skin  is warm and dry. No rash noted. She is not diaphoretic.  Psychiatric: Mood, memory and affect normal.    Lab Results  Component Value Date   TSH 1.558 07/29/2014   Lab Results  Component Value Date   WBC 10.2 07/29/2014   HGB 11.5* 07/29/2014   HCT 35.4* 07/29/2014   MCV 91.7 07/29/2014   PLT 182 07/29/2014   Lab Results  Component Value Date   CREATININE 2.25* 07/31/2014   BUN 68* 07/31/2014   NA 134* 07/31/2014   K 3.7 07/31/2014   CL 95* 07/31/2014   CO2 29 07/31/2014   Lab Results  Component Value Date   ALT 12 07/29/2014   AST 14 07/29/2014   ALKPHOS 57 07/29/2014   BILITOT 0.8 07/29/2014   Lab Results  Component Value Date   CHOL 159 05/11/2014   Lab Results  Component Value Date   HDL 31.20* 05/11/2014   Lab Results  Component Value Date   LDLCALC 41 07/27/2013   Lab Results  Component Value Date   TRIG 337.0* 05/11/2014   Lab Results  Component Value Date   CHOLHDL 5 05/11/2014     Assessment & Plan  Chronic combined systolic and diastolic heart failure Was admitted to the hospital in March 2016 with an acute exacerbation. Is feeling much better and has had follow up with cardiology. No changes today   Type 2 diabetes mellitus, controlled, with renal complications Follows closely with nephrology and endocrinology and declines to let us do blood work for fear of having blood work duplicated. Will request records and she is asked to check sugars at least bid but ultimately up to 4 x a day as needed. minimize simple carbs. Increase exercise as tolerated. Continue current meds   Hypothyroidism On Levothyroxine, follows with Dr Chalmers Cater   NHL (non-Hodgkin's lymphoma) Diagnosis made in 2010 no recurrence   Morbid obesity Encouraged DASH diet, decrease po intake and increase exercise as tolerated. Needs 7-8 hours of sleep nightly. Avoid trans fats, eat small, frequent meals every 4-5 hours with lean proteins,  complex carbs and healthy fats. Minimize simple carbs, GMO foods.   Hyperlipidemia Tolerating statin, encouraged heart healthy diet, avoid trans fats, minimize simple carbs and saturated fats. Increase exercise as tolerated   ATRIAL FIBRILLATION, PAROXYSMAL Required cardioversion in March but feeling well today   OSA (obstructive sleep apnea) Using CPAP routinely   History of Cardiomyopathy, ischemic Follows closely with cardiology   Essential hypertension Well controlled, no changes to meds. Encouraged heart healthy diet such as the DASH diet and exercise as tolerated.    Medicare annual wellness visit, subsequent Patient denies any difficulties at home. No trouble with ADLs, depression or falls. No recent changes to vision or hearing. Is UTD with immunizations. Is UTD with screening. Discussed Advanced Directives, patient agrees to bring [place] copies of documents if can. Encouraged heart healthy diet, exercise as tolerated and adequate sleep. Medication: Reviewed with patient and updated  Preferred Pharmacy and which med where: 90 day supply/mail order: [place] [facility]: [facility]  DM supplies: [facility]   Allergies verified: UTD   Immunization Status: Flu vaccine-- 01/12/14 Tdap-- 01/28/12 PNA-- 03/11/13 (23)  Shingles--08/19/07  A/P:  Changes to [place], [place] or Personal Hx: UTD  Pap-- none found  MMG-- 06/25/08 with Johnnette Gourd, MD at [facility]: Benign Bone Density-- none found  CCS--declined  Eye- 08/23/14 - positive retinopathy, recommended to continue to monitor.  Declines paps  Care Teams Updated: Danton Sewer, MD - Pulmonology, Cristopher Peru, MD- pacemaker, Loralie Champagne, MD- CHF, Donato Heinz, MD- Nephrology   ED/Hospital/Urgent Care Visits: Patient was in hospital 08/02/14 for CHF exacerbation with cardioversion- TCM call made but patient did not want appt, states she is doing much better    To Discuss with Provider: No concerns at this time See problem list for patient risk factors See AVS for preventative healthcare recommendations

## 2014-09-12 NOTE — Assessment & Plan Note (Signed)
Follows closely with cardiology

## 2014-09-12 NOTE — Assessment & Plan Note (Addendum)
Follows closely with nephrology and endocrinology and declines to let us do blood work for fear of having blood work duplicated. Will request records and she is asked to check sugars at least bid but ultimately up to 4 x a day as needed. minimize simple carbs. Increase exercise as tolerated. Continue current meds

## 2014-09-12 NOTE — Assessment & Plan Note (Signed)
Diagnosis made in 2010 no recurrence

## 2014-09-16 ENCOUNTER — Ambulatory Visit (INDEPENDENT_AMBULATORY_CARE_PROVIDER_SITE_OTHER): Payer: Medicare Other

## 2014-09-16 DIAGNOSIS — I482 Chronic atrial fibrillation, unspecified: Secondary | ICD-10-CM

## 2014-09-16 DIAGNOSIS — I4891 Unspecified atrial fibrillation: Secondary | ICD-10-CM | POA: Diagnosis not present

## 2014-09-16 DIAGNOSIS — Z5181 Encounter for therapeutic drug level monitoring: Secondary | ICD-10-CM

## 2014-09-16 DIAGNOSIS — I6350 Cerebral infarction due to unspecified occlusion or stenosis of unspecified cerebral artery: Secondary | ICD-10-CM

## 2014-09-16 DIAGNOSIS — I639 Cerebral infarction, unspecified: Secondary | ICD-10-CM | POA: Diagnosis not present

## 2014-09-16 LAB — POCT INR: INR: 2.8

## 2014-09-20 ENCOUNTER — Encounter: Payer: Self-pay | Admitting: Internal Medicine

## 2014-09-25 ENCOUNTER — Emergency Department (HOSPITAL_COMMUNITY): Payer: Medicare Other

## 2014-09-25 ENCOUNTER — Encounter (HOSPITAL_COMMUNITY): Payer: Self-pay | Admitting: Emergency Medicine

## 2014-09-25 ENCOUNTER — Inpatient Hospital Stay (HOSPITAL_COMMUNITY)
Admission: EM | Admit: 2014-09-25 | Discharge: 2014-09-30 | DRG: 871 | Disposition: A | Discharge status: Home / Self Care | Payer: Medicare Other | Attending: Internal Medicine | Admitting: Internal Medicine

## 2014-09-25 DIAGNOSIS — J9621 Acute and chronic respiratory failure with hypoxia: Secondary | ICD-10-CM | POA: Diagnosis not present

## 2014-09-25 DIAGNOSIS — Y95 Nosocomial condition: Secondary | ICD-10-CM | POA: Diagnosis present

## 2014-09-25 DIAGNOSIS — I5043 Acute on chronic combined systolic (congestive) and diastolic (congestive) heart failure: Secondary | ICD-10-CM | POA: Diagnosis present

## 2014-09-25 DIAGNOSIS — J9622 Acute and chronic respiratory failure with hypercapnia: Secondary | ICD-10-CM | POA: Diagnosis present

## 2014-09-25 DIAGNOSIS — J9601 Acute respiratory failure with hypoxia: Secondary | ICD-10-CM | POA: Diagnosis not present

## 2014-09-25 DIAGNOSIS — M109 Gout, unspecified: Secondary | ICD-10-CM | POA: Diagnosis present

## 2014-09-25 DIAGNOSIS — Z9842 Cataract extraction status, left eye: Secondary | ICD-10-CM

## 2014-09-25 DIAGNOSIS — A419 Sepsis, unspecified organism: Principal | ICD-10-CM | POA: Diagnosis present

## 2014-09-25 DIAGNOSIS — D649 Anemia, unspecified: Secondary | ICD-10-CM | POA: Diagnosis present

## 2014-09-25 DIAGNOSIS — Z8572 Personal history of non-Hodgkin lymphomas: Secondary | ICD-10-CM

## 2014-09-25 DIAGNOSIS — I480 Paroxysmal atrial fibrillation: Secondary | ICD-10-CM | POA: Diagnosis present

## 2014-09-25 DIAGNOSIS — R6520 Severe sepsis without septic shock: Secondary | ICD-10-CM

## 2014-09-25 DIAGNOSIS — Z794 Long term (current) use of insulin: Secondary | ICD-10-CM

## 2014-09-25 DIAGNOSIS — Z882 Allergy status to sulfonamides status: Secondary | ICD-10-CM

## 2014-09-25 DIAGNOSIS — N179 Acute kidney failure, unspecified: Secondary | ICD-10-CM | POA: Diagnosis not present

## 2014-09-25 DIAGNOSIS — Z90710 Acquired absence of both cervix and uterus: Secondary | ICD-10-CM

## 2014-09-25 DIAGNOSIS — F410 Panic disorder [episodic paroxysmal anxiety] without agoraphobia: Secondary | ICD-10-CM | POA: Diagnosis present

## 2014-09-25 DIAGNOSIS — I2510 Atherosclerotic heart disease of native coronary artery without angina pectoris: Secondary | ICD-10-CM | POA: Diagnosis present

## 2014-09-25 DIAGNOSIS — E875 Hyperkalemia: Secondary | ICD-10-CM | POA: Diagnosis not present

## 2014-09-25 DIAGNOSIS — Z6841 Body Mass Index (BMI) 40.0 and over, adult: Secondary | ICD-10-CM

## 2014-09-25 DIAGNOSIS — E039 Hypothyroidism, unspecified: Secondary | ICD-10-CM | POA: Diagnosis present

## 2014-09-25 DIAGNOSIS — E785 Hyperlipidemia, unspecified: Secondary | ICD-10-CM | POA: Diagnosis present

## 2014-09-25 DIAGNOSIS — J189 Pneumonia, unspecified organism: Secondary | ICD-10-CM | POA: Diagnosis not present

## 2014-09-25 DIAGNOSIS — I255 Ischemic cardiomyopathy: Secondary | ICD-10-CM | POA: Diagnosis present

## 2014-09-25 DIAGNOSIS — Z87440 Personal history of urinary (tract) infections: Secondary | ICD-10-CM

## 2014-09-25 DIAGNOSIS — Z66 Do not resuscitate: Secondary | ICD-10-CM | POA: Diagnosis present

## 2014-09-25 DIAGNOSIS — Z7901 Long term (current) use of anticoagulants: Secondary | ICD-10-CM

## 2014-09-25 DIAGNOSIS — R0602 Shortness of breath: Secondary | ICD-10-CM | POA: Diagnosis not present

## 2014-09-25 DIAGNOSIS — E559 Vitamin D deficiency, unspecified: Secondary | ICD-10-CM | POA: Diagnosis present

## 2014-09-25 DIAGNOSIS — M549 Dorsalgia, unspecified: Secondary | ICD-10-CM | POA: Diagnosis present

## 2014-09-25 DIAGNOSIS — Z951 Presence of aortocoronary bypass graft: Secondary | ICD-10-CM

## 2014-09-25 DIAGNOSIS — Z9049 Acquired absence of other specified parts of digestive tract: Secondary | ICD-10-CM | POA: Diagnosis present

## 2014-09-25 DIAGNOSIS — G4733 Obstructive sleep apnea (adult) (pediatric): Secondary | ICD-10-CM | POA: Diagnosis present

## 2014-09-25 DIAGNOSIS — G8929 Other chronic pain: Secondary | ICD-10-CM | POA: Diagnosis present

## 2014-09-25 DIAGNOSIS — N183 Chronic kidney disease, stage 3 (moderate): Secondary | ICD-10-CM | POA: Diagnosis present

## 2014-09-25 DIAGNOSIS — I129 Hypertensive chronic kidney disease with stage 1 through stage 4 chronic kidney disease, or unspecified chronic kidney disease: Secondary | ICD-10-CM | POA: Diagnosis present

## 2014-09-25 DIAGNOSIS — E11319 Type 2 diabetes mellitus with unspecified diabetic retinopathy without macular edema: Secondary | ICD-10-CM | POA: Diagnosis present

## 2014-09-25 DIAGNOSIS — Z8673 Personal history of transient ischemic attack (TIA), and cerebral infarction without residual deficits: Secondary | ICD-10-CM

## 2014-09-25 LAB — I-STAT ARTERIAL BLOOD GAS, ED
Bicarbonate: 25.7 mEq/L — ABNORMAL HIGH (ref 20.0–24.0)
O2 Saturation: 99 %
Patient temperature: 99.8
TCO2: 27 mmol/L (ref 0–100)
pCO2 arterial: 47.9 mmHg — ABNORMAL HIGH (ref 35.0–45.0)
pH, Arterial: 7.341 — ABNORMAL LOW (ref 7.350–7.450)
pO2, Arterial: 133 mmHg — ABNORMAL HIGH (ref 80.0–100.0)

## 2014-09-25 LAB — CBC
HEMATOCRIT: 37.3 % (ref 36.0–46.0)
Hemoglobin: 11.6 g/dL — ABNORMAL LOW (ref 12.0–15.0)
MCH: 28.4 pg (ref 26.0–34.0)
MCHC: 31.1 g/dL (ref 30.0–36.0)
MCV: 91.2 fL (ref 78.0–100.0)
PLATELETS: 239 10*3/uL (ref 150–400)
RBC: 4.09 MIL/uL (ref 3.87–5.11)
RDW: 14.4 % (ref 11.5–15.5)
WBC: 18 10*3/uL — ABNORMAL HIGH (ref 4.0–10.5)

## 2014-09-25 LAB — COMPREHENSIVE METABOLIC PANEL
ALK PHOS: 53 U/L (ref 38–126)
ALT: 16 U/L (ref 14–54)
AST: 28 U/L (ref 15–41)
Albumin: 3.4 g/dL — ABNORMAL LOW (ref 3.5–5.0)
Anion gap: 14 (ref 5–15)
BUN: 60 mg/dL — ABNORMAL HIGH (ref 6–20)
CALCIUM: 8.4 mg/dL — AB (ref 8.9–10.3)
CO2: 21 mmol/L — ABNORMAL LOW (ref 22–32)
Chloride: 98 mmol/L — ABNORMAL LOW (ref 101–111)
Creatinine, Ser: 2.49 mg/dL — ABNORMAL HIGH (ref 0.44–1.00)
GFR, EST AFRICAN AMERICAN: 21 mL/min — AB (ref >60)
GFR, EST NON AFRICAN AMERICAN: 18 mL/min — AB (ref >60)
GLUCOSE: 395 mg/dL — AB (ref 65–99)
Potassium: 6 mmol/L — ABNORMAL HIGH (ref 3.5–5.1)
SODIUM: 133 mmol/L — AB (ref 135–145)
Total Bilirubin: 1.1 mg/dL (ref 0.3–1.2)
Total Protein: 6.5 g/dL (ref 6.5–8.1)

## 2014-09-25 LAB — I-STAT CG4 LACTIC ACID, ED: LACTIC ACID, VENOUS: 2.3 mmol/L — AB (ref 0.5–2.0)

## 2014-09-25 LAB — PROTIME-INR
INR: 3.18 — AB (ref 0.00–1.49)
Prothrombin Time: 32.8 seconds — ABNORMAL HIGH (ref 11.6–15.2)

## 2014-09-25 LAB — I-STAT TROPONIN, ED: Troponin i, poc: 0.01 ng/mL (ref 0.00–0.08)

## 2014-09-25 LAB — APTT: APTT: 33 s (ref 24–37)

## 2014-09-25 LAB — BRAIN NATRIURETIC PEPTIDE: B Natriuretic Peptide: 237.5 pg/mL — ABNORMAL HIGH (ref 0.0–100.0)

## 2014-09-25 NOTE — ED Notes (Signed)
Lab reports pt's initial CMP was hemolyzed, need to redraw specimen.

## 2014-09-25 NOTE — ED Notes (Signed)
[place] herself [place] the fire station c/o SOB, EMS reports pt's 0 sats were in the low 70s, EMS applied CPAP and adm a total of 10mg  of albuterol and 0.5 mg of atrovent. EMS reports 02 sats 85-87% on CPAP.

## 2014-09-26 DIAGNOSIS — N183 Chronic kidney disease, stage 3 (moderate): Secondary | ICD-10-CM | POA: Diagnosis present

## 2014-09-26 DIAGNOSIS — J9622 Acute and chronic respiratory failure with hypercapnia: Secondary | ICD-10-CM | POA: Diagnosis present

## 2014-09-26 DIAGNOSIS — Z794 Long term (current) use of insulin: Secondary | ICD-10-CM | POA: Diagnosis not present

## 2014-09-26 DIAGNOSIS — E11319 Type 2 diabetes mellitus with unspecified diabetic retinopathy without macular edema: Secondary | ICD-10-CM | POA: Diagnosis present

## 2014-09-26 DIAGNOSIS — Z8572 Personal history of non-Hodgkin lymphomas: Secondary | ICD-10-CM | POA: Diagnosis not present

## 2014-09-26 DIAGNOSIS — Z951 Presence of aortocoronary bypass graft: Secondary | ICD-10-CM | POA: Diagnosis not present

## 2014-09-26 DIAGNOSIS — J9620 Acute and chronic respiratory failure, unspecified whether with hypoxia or hypercapnia: Secondary | ICD-10-CM | POA: Diagnosis not present

## 2014-09-26 DIAGNOSIS — E559 Vitamin D deficiency, unspecified: Secondary | ICD-10-CM | POA: Diagnosis present

## 2014-09-26 DIAGNOSIS — Z6841 Body Mass Index (BMI) 40.0 and over, adult: Secondary | ICD-10-CM | POA: Diagnosis not present

## 2014-09-26 DIAGNOSIS — J189 Pneumonia, unspecified organism: Secondary | ICD-10-CM

## 2014-09-26 DIAGNOSIS — Z882 Allergy status to sulfonamides status: Secondary | ICD-10-CM | POA: Diagnosis not present

## 2014-09-26 DIAGNOSIS — D649 Anemia, unspecified: Secondary | ICD-10-CM | POA: Diagnosis present

## 2014-09-26 DIAGNOSIS — I2510 Atherosclerotic heart disease of native coronary artery without angina pectoris: Secondary | ICD-10-CM | POA: Diagnosis present

## 2014-09-26 DIAGNOSIS — E785 Hyperlipidemia, unspecified: Secondary | ICD-10-CM | POA: Diagnosis present

## 2014-09-26 DIAGNOSIS — Z87440 Personal history of urinary (tract) infections: Secondary | ICD-10-CM | POA: Diagnosis not present

## 2014-09-26 DIAGNOSIS — A419 Sepsis, unspecified organism: Secondary | ICD-10-CM

## 2014-09-26 DIAGNOSIS — Z9842 Cataract extraction status, left eye: Secondary | ICD-10-CM | POA: Diagnosis not present

## 2014-09-26 DIAGNOSIS — Z66 Do not resuscitate: Secondary | ICD-10-CM | POA: Diagnosis present

## 2014-09-26 DIAGNOSIS — J9621 Acute and chronic respiratory failure with hypoxia: Secondary | ICD-10-CM | POA: Diagnosis not present

## 2014-09-26 DIAGNOSIS — I5043 Acute on chronic combined systolic (congestive) and diastolic (congestive) heart failure: Secondary | ICD-10-CM | POA: Diagnosis not present

## 2014-09-26 DIAGNOSIS — R0902 Hypoxemia: Secondary | ICD-10-CM | POA: Diagnosis not present

## 2014-09-26 DIAGNOSIS — Z9049 Acquired absence of other specified parts of digestive tract: Secondary | ICD-10-CM | POA: Diagnosis present

## 2014-09-26 DIAGNOSIS — I480 Paroxysmal atrial fibrillation: Secondary | ICD-10-CM | POA: Diagnosis present

## 2014-09-26 DIAGNOSIS — E875 Hyperkalemia: Secondary | ICD-10-CM | POA: Diagnosis present

## 2014-09-26 DIAGNOSIS — I255 Ischemic cardiomyopathy: Secondary | ICD-10-CM | POA: Diagnosis present

## 2014-09-26 DIAGNOSIS — I129 Hypertensive chronic kidney disease with stage 1 through stage 4 chronic kidney disease, or unspecified chronic kidney disease: Secondary | ICD-10-CM | POA: Diagnosis present

## 2014-09-26 DIAGNOSIS — M109 Gout, unspecified: Secondary | ICD-10-CM | POA: Diagnosis present

## 2014-09-26 DIAGNOSIS — N179 Acute kidney failure, unspecified: Secondary | ICD-10-CM | POA: Diagnosis present

## 2014-09-26 DIAGNOSIS — Z8673 Personal history of transient ischemic attack (TIA), and cerebral infarction without residual deficits: Secondary | ICD-10-CM | POA: Diagnosis not present

## 2014-09-26 DIAGNOSIS — M549 Dorsalgia, unspecified: Secondary | ICD-10-CM | POA: Diagnosis present

## 2014-09-26 DIAGNOSIS — Z90710 Acquired absence of both cervix and uterus: Secondary | ICD-10-CM | POA: Diagnosis not present

## 2014-09-26 DIAGNOSIS — E039 Hypothyroidism, unspecified: Secondary | ICD-10-CM | POA: Diagnosis present

## 2014-09-26 DIAGNOSIS — G8929 Other chronic pain: Secondary | ICD-10-CM | POA: Diagnosis present

## 2014-09-26 DIAGNOSIS — F410 Panic disorder [episodic paroxysmal anxiety] without agoraphobia: Secondary | ICD-10-CM | POA: Diagnosis present

## 2014-09-26 DIAGNOSIS — Z7901 Long term (current) use of anticoagulants: Secondary | ICD-10-CM | POA: Diagnosis not present

## 2014-09-26 DIAGNOSIS — G4733 Obstructive sleep apnea (adult) (pediatric): Secondary | ICD-10-CM | POA: Diagnosis present

## 2014-09-26 DIAGNOSIS — Y95 Nosocomial condition: Secondary | ICD-10-CM | POA: Diagnosis present

## 2014-09-26 LAB — COMPREHENSIVE METABOLIC PANEL
ALT: 14 U/L (ref 14–54)
AST: 16 U/L (ref 15–41)
Albumin: 3.2 g/dL — ABNORMAL LOW (ref 3.5–5.0)
Alkaline Phosphatase: 49 U/L (ref 38–126)
Anion gap: 11 (ref 5–15)
BILIRUBIN TOTAL: 0.8 mg/dL (ref 0.3–1.2)
BUN: 60 mg/dL — ABNORMAL HIGH (ref 6–20)
CALCIUM: 8.3 mg/dL — AB (ref 8.9–10.3)
CO2: 22 mmol/L (ref 22–32)
Chloride: 102 mmol/L (ref 101–111)
Creatinine, Ser: 2.5 mg/dL — ABNORMAL HIGH (ref 0.44–1.00)
GFR calc non Af Amer: 18 mL/min — ABNORMAL LOW (ref >60)
GFR, EST AFRICAN AMERICAN: 21 mL/min — AB (ref >60)
Glucose, Bld: 301 mg/dL — ABNORMAL HIGH (ref 65–99)
POTASSIUM: 5.6 mmol/L — AB (ref 3.5–5.1)
SODIUM: 135 mmol/L (ref 135–145)
Total Protein: 6.2 g/dL — ABNORMAL LOW (ref 6.5–8.1)

## 2014-09-26 LAB — CBC WITH DIFFERENTIAL/PLATELET
BASOS ABS: 0 10*3/uL (ref 0.0–0.1)
Basophils Relative: 0 % (ref 0–1)
EOS PCT: 0 % (ref 0–5)
Eosinophils Absolute: 0 10*3/uL (ref 0.0–0.7)
HCT: 33.2 % — ABNORMAL LOW (ref 36.0–46.0)
Hemoglobin: 10.8 g/dL — ABNORMAL LOW (ref 12.0–15.0)
LYMPHS ABS: 1.5 10*3/uL (ref 0.7–4.0)
LYMPHS PCT: 12 % (ref 12–46)
MCH: 29.2 pg (ref 26.0–34.0)
MCHC: 32.5 g/dL (ref 30.0–36.0)
MCV: 89.7 fL (ref 78.0–100.0)
Monocytes Absolute: 1 10*3/uL (ref 0.1–1.0)
Monocytes Relative: 8 % (ref 3–12)
NEUTROS ABS: 10 10*3/uL — AB (ref 1.7–7.7)
Neutrophils Relative %: 80 % — ABNORMAL HIGH (ref 43–77)
Platelets: 211 10*3/uL (ref 150–400)
RBC: 3.7 MIL/uL — ABNORMAL LOW (ref 3.87–5.11)
RDW: 14.5 % (ref 11.5–15.5)
WBC: 12.5 10*3/uL — ABNORMAL HIGH (ref 4.0–10.5)

## 2014-09-26 LAB — I-STAT ARTERIAL BLOOD GAS, ED
Acid-base deficit: 1 mmol/L (ref 0.0–2.0)
BICARBONATE: 25.2 meq/L — AB (ref 20.0–24.0)
O2 Saturation: 87 %
Patient temperature: 99.3
TCO2: 27 mmol/L (ref 0–100)
pCO2 arterial: 46.2 mmHg — ABNORMAL HIGH (ref 35.0–45.0)
pH, Arterial: 7.346 — ABNORMAL LOW (ref 7.350–7.450)
pO2, Arterial: 57 mmHg — ABNORMAL LOW (ref 80.0–100.0)

## 2014-09-26 LAB — TROPONIN I
Troponin I: 0.03 ng/mL (ref <0.031)
Troponin I: 0.04 ng/mL — ABNORMAL HIGH (ref <0.031)
Troponin I: 0.05 ng/mL — ABNORMAL HIGH (ref <0.031)

## 2014-09-26 LAB — URINALYSIS, ROUTINE W REFLEX MICROSCOPIC
Bilirubin Urine: NEGATIVE
Glucose, UA: 500 mg/dL — AB
Hgb urine dipstick: NEGATIVE
Ketones, ur: NEGATIVE mg/dL
Leukocytes, UA: NEGATIVE
NITRITE: NEGATIVE
PROTEIN: NEGATIVE mg/dL
Specific Gravity, Urine: 1.015 (ref 1.005–1.030)
Urobilinogen, UA: 0.2 mg/dL (ref 0.0–1.0)
pH: 5.5 (ref 5.0–8.0)

## 2014-09-26 LAB — GLUCOSE, CAPILLARY
GLUCOSE-CAPILLARY: 193 mg/dL — AB (ref 65–99)
GLUCOSE-CAPILLARY: 135 mg/dL — AB (ref 65–99)
GLUCOSE-CAPILLARY: 235 mg/dL — AB (ref 65–99)
GLUCOSE-CAPILLARY: 361 mg/dL — AB (ref 65–99)

## 2014-09-26 LAB — BASIC METABOLIC PANEL
ANION GAP: 10 (ref 5–15)
BUN: 59 mg/dL — AB (ref 6–20)
CALCIUM: 8.6 mg/dL — AB (ref 8.9–10.3)
CHLORIDE: 100 mmol/L — AB (ref 101–111)
CO2: 24 mmol/L (ref 22–32)
Creatinine, Ser: 2.49 mg/dL — ABNORMAL HIGH (ref 0.44–1.00)
GFR calc Af Amer: 21 mL/min — ABNORMAL LOW (ref >60)
GFR, EST NON AFRICAN AMERICAN: 18 mL/min — AB (ref >60)
Glucose, Bld: 332 mg/dL — ABNORMAL HIGH (ref 65–99)
POTASSIUM: 5.5 mmol/L — AB (ref 3.5–5.1)
Sodium: 134 mmol/L — ABNORMAL LOW (ref 135–145)

## 2014-09-26 LAB — I-STAT CG4 LACTIC ACID, ED: Lactic Acid, Venous: 1.21 mmol/L (ref 0.5–2.0)

## 2014-09-26 LAB — LACTIC ACID, PLASMA
LACTIC ACID, VENOUS: 1.1 mmol/L (ref 0.5–2.0)
LACTIC ACID, VENOUS: 0.7 mmol/L (ref 0.5–2.0)

## 2014-09-26 LAB — PROTIME-INR
INR: 2.95 — AB (ref 0.00–1.49)
PROTHROMBIN TIME: 31 s — AB (ref 11.6–15.2)

## 2014-09-26 LAB — TSH: TSH: 1.854 u[IU]/mL (ref 0.350–4.500)

## 2014-09-26 LAB — MRSA PCR SCREENING: MRSA by PCR: NEGATIVE

## 2014-09-26 LAB — PROCALCITONIN: Procalcitonin: 0.6 ng/mL

## 2014-09-26 MED ORDER — LORAZEPAM 2 MG/ML IJ SOLN
2.0000 mg | Freq: Once | INTRAMUSCULAR | Status: AC
  Administered 2014-09-26: 2 mg via INTRAVENOUS

## 2014-09-26 MED ORDER — LORAZEPAM 2 MG/ML IJ SOLN
0.5000 mg | Freq: Once | INTRAMUSCULAR | Status: AC
  Administered 2014-09-26: 0.5 mg via INTRAVENOUS

## 2014-09-26 MED ORDER — ATORVASTATIN CALCIUM 20 MG PO TABS
20.0000 mg | ORAL_TABLET | Freq: Every day | ORAL | Status: DC
  Administered 2014-09-27 – 2014-09-30 (×4): 20 mg via ORAL
  Filled 2014-09-26 (×5): qty 1

## 2014-09-26 MED ORDER — SODIUM POLYSTYRENE SULFONATE 15 GM/60ML PO SUSP
45.0000 g | Freq: Once | ORAL | Status: DC
Start: 2014-09-26 — End: 2014-09-26

## 2014-09-26 MED ORDER — VANCOMYCIN HCL IN DEXTROSE 1-5 GM/200ML-% IV SOLN: 1000.0000 mg | Freq: Once | INTRAVENOUS | Status: DC

## 2014-09-26 MED ORDER — DEXTROSE 5 % IV SOLN
1.0000 g | INTRAVENOUS | Status: DC
  Administered 2014-09-26 – 2014-09-29 (×3): 1 g via INTRAVENOUS
  Filled 2014-09-26 (×4): qty 1

## 2014-09-26 MED ORDER — ACETAMINOPHEN 650 MG RE SUPP
650.0000 mg | Freq: Once | RECTAL | Status: AC
  Administered 2014-09-26: 650 mg via RECTAL
  Filled 2014-09-26: qty 1

## 2014-09-26 MED ORDER — SODIUM CHLORIDE 0.9 % IJ SOLN
3.0000 mL | Freq: Two times a day (BID) | INTRAMUSCULAR | Status: DC
  Administered 2014-09-26: 3 mL via INTRAVENOUS

## 2014-09-26 MED ORDER — CARVEDILOL 12.5 MG PO TABS
12.5000 mg | ORAL_TABLET | Freq: Two times a day (BID) | ORAL | Status: DC
  Administered 2014-09-26 – 2014-09-30 (×8): 12.5 mg via ORAL
  Filled 2014-09-26 (×11): qty 1

## 2014-09-26 MED ORDER — ESCITALOPRAM OXALATE 10 MG PO TABS
10.0000 mg | ORAL_TABLET | Freq: Every day | ORAL | Status: DC
  Administered 2014-09-26 – 2014-09-30 (×5): 10 mg via ORAL
  Filled 2014-09-26 (×5): qty 1

## 2014-09-26 MED ORDER — ALBUTEROL SULFATE (2.5 MG/3ML) 0.083% IN NEBU: 2.5000 mg | INHALATION_SOLUTION | Freq: Four times a day (QID) | RESPIRATORY_TRACT | Status: DC | PRN

## 2014-09-26 MED ORDER — FUROSEMIDE 10 MG/ML IJ SOLN
80.0000 mg | Freq: Three times a day (TID) | INTRAMUSCULAR | Status: DC
  Administered 2014-09-26 – 2014-09-29 (×10): 80 mg via INTRAVENOUS
  Filled 2014-09-26 (×13): qty 8

## 2014-09-26 MED ORDER — LORAZEPAM 2 MG/ML IJ SOLN
INTRAMUSCULAR | Status: AC
  Filled 2014-09-26: qty 1

## 2014-09-26 MED ORDER — INSULIN NPH (HUMAN) (ISOPHANE) 100 UNIT/ML ~~LOC~~ SUSP
20.0000 [IU] | Freq: Every day | SUBCUTANEOUS | Status: DC
  Administered 2014-09-26 – 2014-09-30 (×4): 20 [IU] via SUBCUTANEOUS
  Filled 2014-09-26 (×2): qty 10

## 2014-09-26 MED ORDER — VANCOMYCIN HCL 10 G IV SOLR: 1750.0000 mg | INTRAVENOUS | Status: DC

## 2014-09-26 MED ORDER — VANCOMYCIN HCL 10 G IV SOLR
1750.0000 mg | INTRAVENOUS | Status: DC
  Administered 2014-09-28: 1750 mg via INTRAVENOUS
  Filled 2014-09-26 (×2): qty 1750

## 2014-09-26 MED ORDER — GABAPENTIN 300 MG PO CAPS
300.0000 mg | ORAL_CAPSULE | Freq: Two times a day (BID) | ORAL | Status: DC
  Administered 2014-09-26 – 2014-09-30 (×8): 300 mg via ORAL
  Filled 2014-09-26 (×10): qty 1

## 2014-09-26 MED ORDER — INSULIN ASPART 100 UNIT/ML ~~LOC~~ SOLN
0.0000 [IU] | SUBCUTANEOUS | Status: DC
  Administered 2014-09-26: 5 [IU] via SUBCUTANEOUS
  Administered 2014-09-26: 1 [IU] via SUBCUTANEOUS
  Administered 2014-09-26: 2 [IU] via SUBCUTANEOUS
  Administered 2014-09-26: 3 [IU] via SUBCUTANEOUS

## 2014-09-26 MED ORDER — VANCOMYCIN HCL 10 G IV SOLR
2000.0000 mg | Freq: Once | INTRAVENOUS | Status: AC
  Administered 2014-09-26: 2000 mg via INTRAVENOUS
  Filled 2014-09-26: qty 2000

## 2014-09-26 MED ORDER — DEXTROSE 5 % IV SOLN: 2.0000 g | Freq: Once | INTRAVENOUS | Status: DC

## 2014-09-26 MED ORDER — WARFARIN SODIUM 5 MG PO TABS
5.0000 mg | ORAL_TABLET | Freq: Once | ORAL | Status: AC
  Administered 2014-09-26: 5 mg via ORAL
  Filled 2014-09-26: qty 1

## 2014-09-26 MED ORDER — SODIUM CHLORIDE 0.9 % IV SOLN: 250.0000 mL | INTRAVENOUS | Status: DC | PRN

## 2014-09-26 MED ORDER — VANCOMYCIN HCL IN DEXTROSE 750-5 MG/150ML-% IV SOLN: 750.0000 mg | INTRAVENOUS | Status: DC

## 2014-09-26 MED ORDER — DEXTROSE 5 % IV SOLN
2.0000 g | Freq: Once | INTRAVENOUS | Status: AC
  Administered 2014-09-26: 2 g via INTRAVENOUS
  Filled 2014-09-26: qty 2

## 2014-09-26 MED ORDER — INSULIN ASPART 100 UNIT/ML ~~LOC~~ SOLN
0.0000 [IU] | Freq: Three times a day (TID) | SUBCUTANEOUS | Status: DC
  Administered 2014-09-27 (×2): 2 [IU] via SUBCUTANEOUS
  Administered 2014-09-27: 1 [IU] via SUBCUTANEOUS
  Administered 2014-09-28: 3 [IU] via SUBCUTANEOUS
  Administered 2014-09-28: 5 [IU] via SUBCUTANEOUS
  Administered 2014-09-28: 2 [IU] via SUBCUTANEOUS
  Administered 2014-09-29 (×2): 5 [IU] via SUBCUTANEOUS
  Administered 2014-09-30: 2 [IU] via SUBCUTANEOUS

## 2014-09-26 MED ORDER — INSULIN NPH (HUMAN) (ISOPHANE) 100 UNIT/ML ~~LOC~~ SUSP
45.0000 [IU] | Freq: Every day | SUBCUTANEOUS | Status: DC
  Administered 2014-09-27 – 2014-09-30 (×3): 45 [IU] via SUBCUTANEOUS
  Filled 2014-09-26: qty 10

## 2014-09-26 MED ORDER — SODIUM CHLORIDE 0.9 % IJ SOLN: 3.0000 mL | INTRAMUSCULAR | Status: DC | PRN

## 2014-09-26 MED ORDER — LORAZEPAM 0.5 MG PO TABS
0.5000 mg | ORAL_TABLET | Freq: Once | ORAL | Status: AC
  Administered 2014-09-26: 0.5 mg via ORAL
  Filled 2014-09-26: qty 1

## 2014-09-26 MED ORDER — WARFARIN - PHARMACIST DOSING INPATIENT
Freq: Every day | Status: DC
  Administered 2014-09-28 – 2014-09-29 (×2)

## 2014-09-26 MED ORDER — ALBUTEROL SULFATE HFA 108 (90 BASE) MCG/ACT IN AERS: 2.0000 | INHALATION_SPRAY | Freq: Four times a day (QID) | RESPIRATORY_TRACT | Status: DC | PRN

## 2014-09-26 MED ORDER — LEVOTHYROXINE SODIUM 25 MCG PO TABS
25.0000 ug | ORAL_TABLET | Freq: Every day | ORAL | Status: DC
  Administered 2014-09-27 – 2014-09-30 (×4): 25 ug via ORAL
  Filled 2014-09-26 (×6): qty 1

## 2014-09-26 NOTE — ED Notes (Signed)
Lab called to inquire about pt's most recent CMP results drawn at [facility].

## 2014-09-26 NOTE — Progress Notes (Signed)
ANTICOAGULATION CONSULT NOTE - Initial Consult  Pharmacy Consult for Coumadin Indication: atrial fibrillation  Allergies  Allergen Reactions  . Sulfonamide Derivatives Other (See Comments)    Unknown; "childhood allergy/mother"    Patient Measurements:    Vital Signs: Temp: 101 F (38.3 C) (05/15 0212) Temp Source: Rectal (05/15 0212) BP: 102/67 mmHg (05/15 0130) Pulse Rate: 106 (05/15 0145)  Labs:  Recent Labs  09/25/14 2159 09/25/14 2254 09/26/14 0148  HGB 11.6*  --   --   HCT 37.3  --   --   PLT 239  --   --   APTT 33  --   --   LABPROT 32.8*  --   --   INR 3.18*  --   --   CREATININE  --  2.49* 2.49*    CrCl cannot be calculated (Unknown ideal weight.).  Home Medications:  Coumadin 7.5 mg daily except 5 mg Mondays and Fridays  Assessment: 74 y.o. female admitted with SOB/PNA, h/o Afib, to continue Coumadin Goal of Therapy:  INR 2-3 Monitor platelets by anticoagulation protocol: Yes   Plan:  Daily INR  Arieliz Latino, Bronson Curb 09/26/2014,2:47 AM

## 2014-09-26 NOTE — ED Provider Notes (Signed)
CSN: [id number]     Arrival date & time 09/25/14  2158 History   First MD Initiated Contact with Patient 09/25/14 2201     Chief Complaint  Patient presents with  . Shortness of Breath   Destiny [name] is a 74 y.o. female with a past medical history significant for congestive heart failure, coronary artery disease, chronic kidney disease, hypothyroidism, sleep apnea, and obesity who presents with cough and shortness of breath. The patient reports that she has had worsening shortness of breath since this morning and normally wears 2 L nasal cannula at home but had to increase that secondary to her shortness of breath to 5 L. The patient was brought to the emergency department when she continued to have worsening tachypnea and tachycardia. On arrival, the patient was placed on BiPAP which she was able to tolerate without complication. The patient denied fevers or chills but does report a dry cough since yesterday. The patient felt that this may be her CHF worsening  So she took an extra "fluid pill" today. The patient denied nausea, vomiting, constipation, diarrhea, dysuria or rashes. The patient denied chest pain.   (Consider location/radiation/quality/duration/timing/severity/associated sxs/prior Treatment) Patient is a 74 y.o. female presenting with shortness of breath. The history is provided by the patient and the spouse. No language interpreter was used.  Shortness of Breath Severity:  Severe Onset quality:  Gradual Duration:  1 day Timing:  Constant Progression:  Worsening Chronicity:  New Relieved by:  Nothing Worsened by:  Nothing tried Ineffective treatments:  Oxygen and rest Associated symptoms: cough   Associated symptoms: no abdominal pain, no chest pain, no diaphoresis, no fever, no headaches, no hemoptysis, no rash, no sputum production, no vomiting and no wheezing   Cough:    Cough characteristics:  Non-productive   Sputum characteristics:  Nondescript   Severity:   Moderate   Onset quality:  Gradual   Duration:  1 day   Timing:  Constant Risk factors: obesity     Past Medical History  Diagnosis Date  . LBBB (left bundle branch block)   . Carotid stenosis     a. 10/19/2011 carotid duplex - Mild hard plaque bilaterally. Stable 40-59% bilateral ICA stenosis. Carotid US (04/2013):  Bilateral 40-59% ICA.  F/u 1 year  . Dyslipidemia   . Hypertension   . Chronic combined systolic and diastolic CHF (congestive heart failure)     a. EF as low as 25% in 2006;  b. EF 60-65% in 12/2011;  c. 02/2013 Echo: EF 35-40, mod mid-dist antsept HK, Gr 2 DD, mild LVH.  . Back pain, chronic     "just when I walk; mass on 3rd and 4th vertebrae right lower back"  . Cardiomyopathy, ischemic     a. 2012 s/p SJM 3231-40 Uni BiV ICD, ser # [id number].  Marland Kitchen CAD (coronary artery disease)     a. 05/2001 CABG x4: LIMA->LAD, VG->D1, VG->D2, VG->RCA.  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 CKD (chronic kidney disease), stage III   . Biventricular implantable cardiac defibrillator in situ 2007, 2012    a. 2007;  b. 2012 Gen change: SJM 3231-40 Uni BiV ICD, ser # [id number].  Marland Kitchen Hypoxemia requiring supplemental oxygen   . Candidiasis of vulva and vagina   . Hypothyroidism   . 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 Benay Spice, in remission  . History of pneumonia 12/27/11    "3  times; it's been a long time ago"  . OSA on CPAP   . Gout ~ 08/2011  . PAF (paroxysmal atrial fibrillation)     on Coumadin  . DM (diabetes mellitus), type 2 with complications     insulin dependent, retinopathy, neuropathy  . Mural thrombus of left ventricle     before 2003, while on Coumadin, No h/o CVA  . Morbid obesity with BMI of 45.0-49.9, adult     Ht. 5'4". BMI 47.2  . Vitamin D deficiency 06/09/2012    historical  . Olecranon bursitis of right elbow 12/29/2012  . History of recurrent UTIs   . Diabetes 05/24/2013  . Hypercalcemia 09/26/2013   . Anemia, unspecified 12/13/2013  . Renal insufficiency 03/14/2014    Following with [facility], Dr Loyal Buba  . Medicare annual wellness visit, subsequent 09/12/2014    Medication: Reviewed with patient and updated  Preferred Pharmacy and which med where: 90 day supply/mail order: [place] [facility]: [facility]  DM supplies: [facility]   Allergies verified: UTD   Immunization Status: Flu vaccine-- 01/12/14 Tdap-- 01/28/12 PNA-- 03/11/13 (23)  Shingles--08/19/07  MMG-- 06/25/08 with Johnnette Gourd, MD at [facility]   Past Surgical History  Procedure Laterality Date  . Cardiac catheterization  06/03/01  . Cholecystectomy  1970  . Tubal ligation  1972  . Abdominal hysterectomy  1982  . Insert / replace / remove pacemaker  2007; 2012    w/AICD  . Coronary artery bypass graft  2003    CABG X4  . Cataract extraction      left eye  . Cardioversion N/A 07/30/2014    Procedure: CARDIOVERSION;  Surgeon: Jolaine Artist, MD;  Location: [facility] ENDOSCOPY;  Service: Cardiovascular;  Laterality: N/A;  . Refractive surgery      right eye   Family History  Problem Relation Age of Onset  . Kidney cancer Mother     kidney and female repo - died @ 28  . Asthma Mother   . Cancer Mother     gyn and renal  . Heart disease Mother     CAD  . Heart disease Father     died @ 56  . Stroke Father   . Diabetes Father   . Hypertension Father   . Hyperlipidemia Father   . Hypertension Son   . Hypertension Maternal Aunt   . Kidney cancer Maternal Uncle   . Cancer Maternal Uncle   . Cirrhosis Maternal Grandmother     non alcohol  . Cancer Maternal Grandfather   . Kidney cancer Maternal Grandfather    History  Substance Use Topics  . Smoking status: Never Smoker   . Smokeless tobacco: Never Used  . Alcohol Use: No   OB History    No data available     Review of Systems  Constitutional: Negative for fever, chills, diaphoresis and appetite  change.  HENT: Negative for congestion.   Eyes: Negative for visual disturbance.  Respiratory: Positive for cough and shortness of breath. Negative for hemoptysis, sputum production, chest tightness, wheezing and stridor.   Cardiovascular: Negative for chest pain, palpitations and leg swelling.  Gastrointestinal: Negative for nausea, vomiting, abdominal pain, diarrhea and constipation.  Genitourinary: Negative for dysuria.  Musculoskeletal: Negative for back pain.  Skin: Negative for rash.  Neurological: Negative for seizures and headaches.  Psychiatric/Behavioral: Negative for agitation.  All other systems reviewed and are negative.     Allergies  Sulfonamide derivatives  Home  Medications   Prior to Admission medications   Medication Sig Start Date End Date Taking? Authorizing Provider  acetaminophen (TYLENOL) 500 MG tablet Take 1,000 mg by mouth every 6 (six) hours as needed (pain).   Yes Historical Provider, MD  albuterol (PROVENTIL HFA;VENTOLIN HFA) 108 (90 BASE) MCG/ACT inhaler Inhale 2 puffs into the lungs every 6 (six) hours as needed for wheezing or shortness of breath. 12/26/12  Yes Mosie Lukes, MD  amLODipine (NORVASC) 5 MG tablet Take 1 tablet (5 mg total) by mouth daily. 08/31/14  Yes Mosie Lukes, MD  atorvastatin (LIPITOR) 20 MG tablet Take 1 tablet (20 mg total) by mouth daily. 08/31/14  Yes Mosie Lukes, MD  carvedilol (COREG) 25 MG tablet TAKE 1 TABLET TWICE DAILY  WITH  A  MEAL. 04/26/14  Yes Shaune Pascal Bensimhon, MD  enalapril (VASOTEC) 2.5 MG tablet TAKE 1 TABLET TWICE DAILY. 04/26/14  Yes Shaune Pascal Bensimhon, MD  escitalopram (LEXAPRO) 10 MG tablet Take 1 tablet (10 mg total) by mouth daily. 08/31/14  Yes Mosie Lukes, MD  fenofibrate (TRICOR) 48 MG tablet Take 1 tablet (48 mg total) by mouth daily. 07/15/14  Yes Larey Dresser, MD  fluticasone [facility]) 50 MCG/ACT nasal spray Place 2 sprays into both nostrils daily as needed for allergies or rhinitis. As needed  for nasal stuffiness. 01/13/14  Yes Mosie Lukes, MD  gabapentin (NEURONTIN) 300 MG capsule Take 1 capsule (300 mg total) by mouth 2 (two) times daily. 08/31/14  Yes Mosie Lukes, MD  glucose blood (FREESTYLE LITE) test strip DX: 250.60  Check sugars bid and as needed 05/22/13  Yes Mosie Lukes, MD  insulin NPH (HUMULIN N,NOVOLIN N) 100 UNIT/ML injection Inject 20-45 Units into the skin 2 (two) times daily before a meal. Take 45 units in the morning and 20 units in the evening   Yes Historical Provider, MD  insulin regular (NOVOLIN R,HUMULIN R) 100 units/mL injection Change to 15 units three times a with meals (to cover all meals--this is the same total amount but spread out) Patient taking differently: Inject 15 Units into the skin 2 (two) times daily before a meal. Change to 15 units three times a with meals (to cover all meals--this is the same total amount but spread out) 04/01/13  Yes Ivan Anchors Love, PA-C  levothyroxine (SYNTHROID, LEVOTHROID) 25 MCG tablet TAKE 1 TABLET EVERY DAY BEFORE BREAKFAST 08/31/14  Yes Mosie Lukes, MD  LORazepam (ATIVAN) 0.5 MG tablet Take 1 tablet (0.5 mg total) by mouth at bedtime as needed for anxiety. 08/31/14  Yes Mosie Lukes, MD  NON FORMULARY Oxygen---24 hour   Yes Historical Provider, MD  torsemide (DEMADEX) 20 MG tablet Take 2 tablets (40 mg total) by mouth once. may take an extra tab for 3# weight gain, swelling or SOB Patient taking differently: Take 40 mg by mouth once. may take an extra tab for 3# weight gain, swelling or SOB Takes one in the morning and one at 3:30 07/15/14  Yes Larey Dresser, MD  Vitamin D, Ergocalciferol, (DRISDOL) 50000 UNITS CAPS capsule Take 50,000 Units by mouth every 7 (seven) days. Tuesdays   Yes Historical Provider, MD  warfarin (COUMADIN) 5 MG tablet Take 7.5 mg by mouth daily at 6 PM. Monday and Friday takes 5mg     And all other days takes 7.5mg    Yes Historical Provider, MD  Omega-3 Fatty Acids ([employer] FISH OIL) 1000 MG CAPS  Take 2 capsules by  mouth daily. Patient not taking: Reported on 09/26/2014 07/15/14   Larey Dresser, MD   BP 132/78 mmHg  Pulse 97  Temp(Src) 100.5 F (38.1 C) (Rectal)  Resp 23  SpO2 99% Physical Exam  Constitutional: She is oriented to person, place, and time. She appears well-developed. No distress. Face mask in place.  HENT:  Head: Normocephalic and atraumatic. Head is without contusion.  Mouth/Throat: No oropharyngeal exudate.  Eyes: Pupils are equal, round, and reactive to light. No scleral icterus.  Neck: Normal range of motion.  Cardiovascular: Normal heart sounds and intact distal pulses.  Tachycardia present.   No murmur heard. Pulmonary/Chest: No stridor. Tachypnea noted. She is in respiratory distress. She has no decreased breath sounds. She has no wheezes. She has rhonchi. She has rales. She exhibits no tenderness.  Abdominal: Soft. Bowel sounds are normal. She exhibits no distension. There is no tenderness. There is no rigidity and no rebound.  Musculoskeletal: She exhibits no tenderness.  Neurological: She is alert and oriented to person, place, and time.  Skin: Skin is warm. No pallor.  Psychiatric: She has a normal mood and affect.    ED Course  Procedures (including critical care time) Labs Review Labs Reviewed  CBC - Abnormal; Notable for the following:    WBC 18.0 (*)    Hemoglobin 11.6 (*)    All other components within normal limits  BRAIN NATRIURETIC PEPTIDE - Abnormal; Notable for the following:    B Natriuretic Peptide 237.5 (*)    All other components within normal limits  PROTIME-INR - Abnormal; Notable for the following:    Prothrombin Time 32.8 (*)    INR 3.18 (*)    All other components within normal limits  COMPREHENSIVE METABOLIC PANEL - Abnormal; Notable for the following:    Sodium 133 (*)    Potassium 6.0 (*)    Chloride 98 (*)    CO2 21 (*)    Glucose, Bld 395 (*)    BUN 60 (*)    Creatinine, Ser 2.49 (*)    Calcium 8.4 (*)     Albumin 3.4 (*)    GFR calc non Af Amer 18 (*)    GFR calc Af Amer 21 (*)    All other components within normal limits  URINALYSIS, ROUTINE W REFLEX MICROSCOPIC - Abnormal; Notable for the following:    Glucose, UA 500 (*)    All other components within normal limits  BASIC METABOLIC PANEL - Abnormal; Notable for the following:    Sodium 134 (*)    Potassium 5.5 (*)    Chloride 100 (*)    Glucose, Bld 332 (*)    BUN 59 (*)    Creatinine, Ser 2.49 (*)    Calcium 8.6 (*)    GFR calc non Af Amer 18 (*)    GFR calc Af Amer 21 (*)    All other components within normal limits  I-STAT ARTERIAL BLOOD GAS, ED - Abnormal; Notable for the following:    pH, Arterial 7.341 (*)    pCO2 arterial 47.9 (*)    pO2, Arterial 133.0 (*)    Bicarbonate 25.7 (*)    All other components within normal limits  I-STAT CG4 LACTIC ACID, ED - Abnormal; Notable for the following:    Lactic Acid, Venous 2.30 (*)    All other components within normal limits  I-STAT ARTERIAL BLOOD GAS, ED - Abnormal; Notable for the following:    pH, Arterial 7.346 (*)  pCO2 arterial 46.2 (*)    pO2, Arterial 57.0 (*)    Bicarbonate 25.2 (*)    All other components within normal limits  CULTURE, BLOOD (ROUTINE X 2)  CULTURE, BLOOD (ROUTINE X 2)  APTT  BLOOD GAS, ARTERIAL  CBC WITH DIFFERENTIAL/PLATELET  LACTIC ACID, PLASMA  LACTIC ACID, PLASMA  PROCALCITONIN  COMPREHENSIVE METABOLIC PANEL  CBC WITH DIFFERENTIAL/PLATELET  TROPONIN I  TROPONIN I  TROPONIN I  TSH  HEMOGLOBIN A1C  PROTIME-INR  I-STAT TROPOININ, ED  I-STAT CG4 LACTIC ACID, ED  I-STAT CG4 LACTIC ACID, ED    Imaging Review Dg Chest Port 1 View  09/25/2014   CLINICAL DATA:  Shortness of breath.  EXAM: PORTABLE CHEST - 1 VIEW  COMPARISON:  07/28/2014  FINDINGS: Postoperative changes in the mediastinum. Cardiac pacemaker. Shallow inspiration. Cardiac enlargement with diffuse pulmonary vascular congestion. Perihilar airspace disease suggesting edema.  Superimposed consolidation in the right upper lung demonstrates progression since prior study and may represent superimposed atelectasis or pneumonia. No pneumothorax.  IMPRESSION: Cardiac enlargement with pulmonary vascular congestion, perihilar edema, and superimposed consolidation in the right upper lung.   Electronically Signed   By: Lucienne Capers M.D.   On: 09/25/2014 23:02     EKG Interpretation   Date/Time:  Saturday Sep 25 2014 22:07:26 EDT Ventricular Rate:  122 PR Interval:    QRS Duration: 161 QT Interval:  381 QTC Calculation: 543 R Axis:   105 Text Interpretation:  Atrial fibrillation Nonspecific intraventricular  conduction delay Consider anterior infarct No significant change since  last tracing Confirmed by Kathrynn Humble, MD, Thelma Comp [phone]) on 09/26/2014 1:37:25  AM      MDM   Final diagnoses:  CAP (community acquired pneumonia)  Severe sepsis  Acute respiratory failure with hypoxia    Destiny [name] is a 74 y.o. female with a past medical history significant for congestive heart failure, coronary artery disease, chronic kidney disease, hypothyroidism, sleep apnea, and obesity who presents with cough and shortness of breath. On arrival, the patient was alert and oriented 3 and was placed on BiPAP for airway management. The patient reported improvement in her shortness of breath with BiPAP on board. The patient's lungs revealed coarse breath sounds bilaterally. The patient continued to deny chest pain. The patient was found to have a fever on further temperature gathering. The patient continued to be tachycardic and tachypneic and required BiPAP for oxygen saturation maintenance. The patient's initial EKG showed no significant change from prior and she continued to deny chest pain.  The patient's chest x-ray showed evidence of pneumonia as the source of her shortness of breath, leukocytosis, respiratory distress, and fever. The patient was started on antibiotics and will be  admitted for further management of her community acquired pneumonia.   Given the patient's shortness of breath and cough, initial differential diagnosis included pneumonia as well as CHF exacerbation. The patient's initial CBC revealed a leukocytosis of 18, Her initial troponin was normal, her initial be in PE was mildly elevated but not as high as it has been in the past. The patient's blood gas showed a mild acidosis with a PO2 of 133 on BiPAP. The patient's initial lactic acid was 2.3. As the patient has a history of CHF and there was concern for exacerbation, the patient did not get fluid resuscitation however, her lactic acid was trended and improved during her emergency department management.  The patient will be admitted.    This patient was seen with Dr. Kathrynn Humble, emergency medicine  attending.    Antony Blackbird, MD 09/26/14 [id number]  Varney Biles, MD 09/26/14 [phone]

## 2014-09-26 NOTE — H&P (Addendum)
Hospitalist Admission History and Physical  Patient name: Destiny [name] Medical record number: [id number] Date of birth: 1940-06-16 Age: 74 y.o. Gender: female  Primary Care Provider: Penni Homans, MD  Chief Complaint: sepsis, acute on chronic resp failure, HCAP, hyperkalemia, acute on chronic mixed CHF   History of Present Illness:This is a 74 y.o. year old female with significant past medical history of morbid obesity, IDDM, paroxysmal atrial fibrillation on coumadin, chronic resp failure on 2, chronic combined systolic and diastolic CHF, CAD s/p CABG, stage 3 CKD presenting with sepsis, acute on chronic resp failure, HCAP, hyperkalemia, acute on chronic mixed CHF. Husband and patient report patient with progressive weakness and shortness of breath over the past 2 days. Symptoms seemed to progressively worsen primarily over the past 24 hours. Reports increased work of breathing. No fevers or chills. Has no some mild increase in baseline weight her husband and patient. Attended take increased doses of torsemide at home however, this was ineffective and denies any increased salt intake nonsmoker. No wheezing. Patient presents to ER Tmax 101, heart rate into 120s, respirations in the tens to 40s, blood pressures in the 100s to 140s, satting 88% on 3-4 L, transition to BiPAP. Satting mid to upper 80s on BiPAP. White blood cell count 18, creatinine 2.5, potassium 6, INR 3.2, blood sugar 332. ABG with a compensated mild respiratory acidosis. Lactate 2.3-1.2. BNP 238. Dry weight 259 pounds. Weight pending. Chest x-ray with cardiac enlargement with pulmonary vascular congestion, perihilar edema and superimposed consolidation in the right upper lung.  Assessment and Plan: Destiny [name] is a 74 y.o. year old female presenting with sepsis, acute on chronic resp failure, HCAP, hyperkalemia, acute on chronic mixed CHF   Active Problems:   Acute on chronic respiratory failure with hypoxia   HCAP  (healthcare-associated pneumonia)   Sepsis   1- Sepsis  -meets criteria by T, HR, WBC  -likely secondary to HCAP (last hospitalization w/in 90 days) -pan culture -lactate improving s/p gentle IVFs -trend -LLN BP in setting of hypertensive use  -HOLD overnight -stepdown bed   2-Acute on Chronic Resp Failure -likely multifactorial in setting HCAP and CHF -ABG stable  -cont BiPAP  -treat HCAP as above -BNP minimally to mildly elevated, though w/ pulm vascular congestion + cardiomegaly on imaging -+ rales on exam -will need to gently diurese in setting of above  3-Chronic mixed diastolic and systolic CHF -followed in heart failure clinic outpt -s/p ICD  -2D ECHO 08/2013 EF 40%, diffuse hypokinesis, grade 2 diastolic dysfunction  -clinically volume overloaded -dry weight noted to be 259 lbs from last admission -pending weight -strict Is and Os and daily weights  -gently diurese given sepsis 2/2 HCAP -noted LLN BPs in setting of sepsis and BP medication use -HOLD BP meds overnight -titrate as clinically indicated -consider formal cards consult in am   4-Hyperkalemia -no peaked T waves on EKG -kayexalate -tele bed  5-Atrial fibrillation  -INR therapeutic -cont coumadin  -follow HR in setting of above -will need to titrate BB w/ active duresis and sepsis treatment as to avoid conversion in to afib w/ RVR  -tele bed  -follow closely   6-CAD  -no active CP  -trop and EKG WNL  -cycle CEs -tele bed  7- IDDM -SSI -A1C  -cont NPH  8-CKD -stage 3 -at baseline -follow   FEN/GI: NPO for now  Prophylaxis: couamdin  Disposition: pending further evaluation Code Status: Full Code    Patient Active Problem List  Diagnosis Date Noted  . Acute on chronic respiratory failure with hypoxia 09/26/2014  . HCAP (healthcare-associated pneumonia) 09/26/2014  . Medicare annual wellness visit, subsequent 09/12/2014  . Coagulopathy 07/29/2014  . Morbid obesity 07/29/2014   . Type 2 diabetes mellitus, controlled, with renal complications 67/89/3810  . Chronic combined systolic and diastolic CHF (congestive heart failure) 07/28/2014  . Anemia 12/13/2013  . DOE (dyspnea on exertion) 11/30/2013  . Edema 09/26/2013  . Hypercalcemia 09/26/2013  . Encounter for therapeutic drug monitoring 06/10/2013  . Chronic combined systolic and diastolic heart failure 17/51/0258  . Physical deconditioning 03/25/2013  . DNR (do not resuscitate) 03/24/2013  . Pulmonary edema 03/17/2013  . Hyperkalemia 03/12/2013  . Hypothyroidism 03/06/2013  . Acute on chronic combined systolic and diastolic heart failure 52/77/8242  . Olecranon bursitis of right elbow 12/29/2012  . Vitamin D deficiency 06/09/2012  . CKD (chronic kidney disease) stage 3, GFR 30-59 ml/min 12/27/2011  . History of Cardiomyopathy, ischemic   . OSA (obstructive sleep apnea) 09/05/2010  . LEG PAIN, BILATERAL 12/28/2009  . Diabetes mellitus type 2 with retinopathy 10/28/2009  . ATRIAL FIBRILLATION, PAROXYSMAL 06/21/2009  . NHL (non-Hodgkin's lymphoma) 04/21/2009  . MURAL THROMBUS, LEFT VENTRICLE 03/07/2009  . LYMPHEDEMA 03/07/2009  . LBBB 08/26/2008  . CVA 08/26/2008  . BACK PAIN, CHRONIC 08/26/2008  . ICD - IN SITU 08/26/2008  . UTI'S, RECURRENT 06/24/2008  . CAROTID BRUIT 05/20/2007  . Hyperlipidemia 03/19/2007  . Essential hypertension 11/20/2006   Past Medical History: Past Medical History  Diagnosis Date  . LBBB (left bundle branch block)   . Carotid stenosis     a. 10/19/2011 carotid duplex - Mild hard plaque bilaterally. Stable 40-59% bilateral ICA stenosis. Carotid US (04/2013):  Bilateral 40-59% ICA.  F/u 1 year  . Dyslipidemia   . Hypertension   . Chronic combined systolic and diastolic CHF (congestive heart failure)     a. EF as low as 25% in 2006;  b. EF 60-65% in 12/2011;  c. 02/2013 Echo: EF 35-40, mod mid-dist antsept HK, Gr 2 DD, mild LVH.  . Back pain, chronic     "just when I walk;  mass on 3rd and 4th vertebrae right lower back"  . Cardiomyopathy, ischemic     a. 2012 s/p SJM 3231-40 Uni BiV ICD, ser # [id number].  Marland Kitchen CAD (coronary artery disease)     a. 05/2001 CABG x4: LIMA->LAD, VG->D1, VG->D2, VG->RCA.  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 CKD (chronic kidney disease), stage III   . Biventricular implantable cardiac defibrillator in situ 2007, 2012    a. 2007;  b. 2012 Gen change: SJM 3231-40 Uni BiV ICD, ser # [id number].  Marland Kitchen Hypoxemia requiring supplemental oxygen   . Candidiasis of vulva and vagina   . Hypothyroidism   . 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 Benay Spice, in remission  . History of pneumonia 12/27/11    "3 times; it's been a long time ago"  . OSA on CPAP   . Gout ~ 08/2011  . PAF (paroxysmal atrial fibrillation)     on Coumadin  . DM (diabetes mellitus), type 2 with complications     insulin dependent, retinopathy, neuropathy  . Mural thrombus of left ventricle     before 2003, while on Coumadin, No h/o CVA  . Morbid obesity with BMI of 45.0-49.9, adult     Ht. 5'4". BMI 47.2  . Vitamin  D deficiency 06/09/2012    historical  . Olecranon bursitis of right elbow 12/29/2012  . History of recurrent UTIs   . Diabetes 05/24/2013  . Hypercalcemia 09/26/2013  . Anemia, unspecified 12/13/2013  . Renal insufficiency 03/14/2014    Following with [facility], Dr Loyal Buba  . Medicare annual wellness visit, subsequent 09/12/2014    Medication: Reviewed with patient and updated  Preferred Pharmacy and which med where: 90 day supply/mail order: [facility]: [place]  DM supplies: [facility]   Allergies verified: UTD   Immunization Status: Flu vaccine-- 01/12/14 Tdap-- 01/28/12 PNA-- 03/11/13 (23)  Shingles--08/19/07  MMG-- 06/25/08 with Johnnette Gourd, MD at [facility]    Past Surgical History: Past Surgical History  Procedure Laterality  Date  . Cardiac catheterization  06/03/01  . Cholecystectomy  1970  . Tubal ligation  1972  . Abdominal hysterectomy  1982  . Insert / replace / remove pacemaker  2007; 2012    w/AICD  . Coronary artery bypass graft  2003    CABG X4  . Cataract extraction      left eye  . Cardioversion N/A 07/30/2014    Procedure: CARDIOVERSION;  Surgeon: Jolaine Artist, MD;  Location: [facility] ENDOSCOPY;  Service: Cardiovascular;  Laterality: N/A;  . Refractive surgery      right eye    Social History: History   Social History  . Marital Status: Married    Spouse Name: N/A  . Number of Children: Y  . Years of Education: N/A   Occupational History  . 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    Social History Main Topics  . Smoking status: Never Smoker   . Smokeless tobacco: Never Used  . Alcohol Use: No  . Drug Use: No  . Sexual Activity: Not Currently     Comment: lives with husband, no major dietary restrictions   Other Topics Concern  . None   Social History Narrative   Lives in [place] with her husband.  She does not routinely exercise or adhere to any particular diet.      Family History: Family History  Problem Relation Age of Onset  . Kidney cancer Mother     kidney and female repo - died @ 28  . Asthma Mother   . Cancer Mother     gyn and renal  . Heart disease Mother     CAD  . Heart disease Father     died @ 48  . Stroke Father   . Diabetes Father   . Hypertension Father   . Hyperlipidemia Father   . Hypertension Son   . Hypertension Maternal Aunt   . Kidney cancer Maternal Uncle   . Cancer Maternal Uncle   . Cirrhosis Maternal Grandmother     non alcohol  . Cancer Maternal Grandfather   . Kidney cancer Maternal Grandfather     Allergies: Allergies  Allergen Reactions  . Sulfonamide Derivatives Other (See Comments)    Unknown; "childhood allergy/mother"    Current Facility-Administered Medications  Medication Dose Route Frequency Provider Last Rate  Last Dose  . 0.9 %  sodium chloride infusion  250 mL Intravenous PRN Deneise Lever, MD      . albuterol (PROVENTIL HFA;VENTOLIN HFA) 108 (90 BASE) MCG/ACT inhaler 2 puff  2 puff Inhalation Q6H PRN Deneise Lever, MD      . atorvastatin (LIPITOR) tablet 20 mg  20 mg Oral Daily  Deneise Lever, MD      . ceFEPIme (MAXIPIME) 2 g in dextrose 5 % 50 mL IVPB  2 g Intravenous Once Deneise Lever, MD      . furosemide (LASIX) injection 80 mg  80 mg Intravenous 3 times per day Deneise Lever, MD      . gabapentin (NEURONTIN) capsule 300 mg  300 mg Oral BID Deneise Lever, MD      . insulin aspart (novoLOG) injection 0-9 Units  0-9 Units Subcutaneous 6 times per day Deneise Lever, MD      . insulin NPH Human (HUMULIN N,NOVOLIN N) injection 20-45 Units  20-45 Units Subcutaneous BID AC Deneise Lever, MD      . levothyroxine (SYNTHROID, LEVOTHROID) tablet 25 mcg  25 mcg Oral QAC breakfast Deneise Lever, MD      . sodium chloride 0.9 % injection 3 mL  3 mL Intravenous Q12H Deneise Lever, MD      . sodium chloride 0.9 % injection 3 mL  3 mL Intravenous Q12H Deneise Lever, MD      . sodium chloride 0.9 % injection 3 mL  3 mL Intravenous PRN Deneise Lever, MD      . vancomycin (VANCOCIN) 2,000 mg in sodium chloride 0.9 % 500 mL IVPB  2,000 mg Intravenous Once Antony Blackbird, MD 250 mL/hr at 09/26/14 0200 2,000 mg at 09/26/14 0200  . vancomycin (VANCOCIN) IVPB 1000 mg/200 mL premix  1,000 mg Intravenous Once Deneise Lever, MD       Current Outpatient Prescriptions  Medication Sig Dispense Refill  . acetaminophen (TYLENOL) 500 MG tablet Take 1,000 mg by mouth every 6 (six) hours as needed (pain).    Marland Kitchen albuterol (PROVENTIL HFA;VENTOLIN HFA) 108 (90 BASE) MCG/ACT inhaler Inhale 2 puffs into the lungs every 6 (six) hours as needed for wheezing or shortness of breath. 1 Inhaler 1  . amLODipine (NORVASC) 5 MG tablet Take 1 tablet (5 mg total) by mouth daily. 90 tablet 1  . atorvastatin (LIPITOR) 20  MG tablet Take 1 tablet (20 mg total) by mouth daily. 90 tablet 1  . carvedilol (COREG) 25 MG tablet TAKE 1 TABLET TWICE DAILY  WITH  A  MEAL. 180 tablet 6  . enalapril (VASOTEC) 2.5 MG tablet TAKE 1 TABLET TWICE DAILY. 180 tablet 6  . escitalopram (LEXAPRO) 10 MG tablet Take 1 tablet (10 mg total) by mouth daily. 90 tablet 1  . fenofibrate (TRICOR) 48 MG tablet Take 1 tablet (48 mg total) by mouth daily. 90 tablet 3  . fluticasone (FLONASE) 50 MCG/ACT nasal spray Place 2 sprays into both nostrils daily as needed for allergies or rhinitis. As needed for nasal stuffiness. 16 g 0  . gabapentin (NEURONTIN) 300 MG capsule Take 1 capsule (300 mg total) by mouth 2 (two) times daily. 180 capsule 1  . glucose blood (FREESTYLE LITE) test strip DX: 250.60  Check sugars bid and as needed 100 each 2  . insulin NPH (HUMULIN N,NOVOLIN N) 100 UNIT/ML injection Inject 20-45 Units into the skin 2 (two) times daily before a meal. Take 45 units in the morning and 20 units in the evening    . insulin regular (NOVOLIN R,HUMULIN R) 100 units/mL injection Change to 15 units three times a with meals (to cover all meals--this is the same total amount but spread out) (Patient taking differently: Inject 15 Units into the skin 2 (two) times daily before a meal. Change  to 15 units three times a with meals (to cover all meals--this is the same total amount but spread out)) 10 mL 12  . levothyroxine (SYNTHROID, LEVOTHROID) 25 MCG tablet TAKE 1 TABLET EVERY DAY BEFORE BREAKFAST 90 tablet 1  . LORazepam (ATIVAN) 0.5 MG tablet Take 1 tablet (0.5 mg total) by mouth at bedtime as needed for anxiety. 90 tablet 1  . NON FORMULARY Oxygen---24 hour    . torsemide (DEMADEX) 20 MG tablet Take 2 tablets (40 mg total) by mouth once. may take an extra tab for 3# weight gain, swelling or SOB (Patient taking differently: Take 40 mg by mouth once. may take an extra tab for 3# weight gain, swelling or SOB Takes one in the morning and one at 3:30)  135 tablet 3  . Vitamin D, Ergocalciferol, (DRISDOL) 50000 UNITS CAPS capsule Take 50,000 Units by mouth every 7 (seven) days. Tuesdays    . warfarin (COUMADIN) 5 MG tablet Take 7.5 mg by mouth daily at 6 PM. Monday and Friday takes 5mg     And all other days takes 7.5mg     . Omega-3 Fatty Acids ([employer] FISH OIL) 1000 MG CAPS Take 2 capsules by mouth daily. (Patient not taking: Reported on 09/26/2014) 60 capsule 3   Review Of Systems: 12 point ROS negative except as noted above in HPI.  Physical Exam: Filed Vitals:   09/26/14 0212  BP:   Pulse:   Temp: 101 F (38.3 C)  Resp:     General: cooperative, morbidly obese and mild increased WOB  HEENT: PERRLA and extra ocular movement intact Heart: S1, S2 normal, no murmur, rub or gallop, regular rate and rhythm Lungs: mild increased WOB, + rales diffusely  Abdomen: abdomen is soft without significant tenderness, masses, organomegaly or guarding Extremities: extremities normal, atraumatic, no cyanosis or edema Skin:no rashes Neurology: normal without focal findings  Labs and Imaging: Lab Results  Component Value Date/Time   NA 134* 09/26/2014 01:48 AM   K 5.5* 09/26/2014 01:48 AM   CL 100* 09/26/2014 01:48 AM   CO2 24 09/26/2014 01:48 AM   BUN 59* 09/26/2014 01:48 AM   CREATININE 2.49* 09/26/2014 01:48 AM   CREATININE 2.23* 12/26/2011 12:36 PM   GLUCOSE 332* 09/26/2014 01:48 AM   GLUCOSE 276* 04/22/2006 09:00 AM   Lab Results  Component Value Date   WBC 18.0* 09/25/2014   HGB 11.6* 09/25/2014   HCT 37.3 09/25/2014   MCV 91.2 09/25/2014   PLT 239 09/25/2014   Urinalysis    Component Value Date/Time   COLORURINE YELLOW 09/25/2014 2336   APPEARANCEUR CLEAR 09/25/2014 2336   LABSPEC 1.015 09/25/2014 2336   PHURINE 5.5 09/25/2014 2336   GLUCOSEU 500* 09/25/2014 2336   GLUCOSEU NEGATIVE 01/13/2009 1526   HGBUR NEGATIVE 09/25/2014 2336   HGBUR large 05/17/2008 1325   BILIRUBINUR NEGATIVE 09/25/2014 2336   KETONESUR NEGATIVE  09/25/2014 2336   PROTEINUR NEGATIVE 09/25/2014 2336   UROBILINOGEN 0.2 09/25/2014 2336   NITRITE NEGATIVE 09/25/2014 2336   LEUKOCYTESUR NEGATIVE 09/25/2014 2336       Dg Chest Port 1 View  09/25/2014   CLINICAL DATA:  Shortness of breath.  EXAM: PORTABLE CHEST - 1 VIEW  COMPARISON:  07/28/2014  FINDINGS: Postoperative changes in the mediastinum. Cardiac pacemaker. Shallow inspiration. Cardiac enlargement with diffuse pulmonary vascular congestion. Perihilar airspace disease suggesting edema. Superimposed consolidation in the right upper lung demonstrates progression since prior study and may represent superimposed atelectasis or pneumonia. No pneumothorax.  IMPRESSION: Cardiac enlargement  with pulmonary vascular congestion, perihilar edema, and superimposed consolidation in the right upper lung.   Electronically Signed   By: Lucienne Capers M.D.   On: 09/25/2014 23:02           Shanda Howells MD  Pager: [phone]

## 2014-09-26 NOTE — Progress Notes (Addendum)
ANTIBIOTIC CONSULT NOTE - INITIAL  Pharmacy Consult for Vancomycin and Cefepime  Indication: rule out pneumonia  Allergies  Allergen Reactions  . Sulfonamide Derivatives Other (See Comments)    Unknown; "childhood allergy/mother"    Patient Measurements:   Adjusted Body Weight: 80 kg  Vital Signs: Temp: 101 F (38.3 C) (05/15 0212) Temp Source: Rectal (05/15 0212) BP: 102/67 mmHg (05/15 0130) Pulse Rate: 106 (05/15 0145) Intake/Output from previous day: 05/14 0701 - 05/15 0700 In: -  Out: 100 [Urine:100] Intake/Output from this shift: Total I/O In: -  Out: 100 [Urine:100]  Labs:  Recent Labs  09/25/14 2159 09/25/14 2254 09/26/14 0148  WBC 18.0*  --   --   HGB 11.6*  --   --   PLT 239  --   --   CREATININE  --  2.49* 2.49*   CrCl cannot be calculated (Unknown ideal weight.). No results for input(s): VANCOTROUGH, VANCOPEAK, VANCORANDOM, GENTTROUGH, GENTPEAK, GENTRANDOM, TOBRATROUGH, TOBRAPEAK, TOBRARND, AMIKACINPEAK, AMIKACINTROU, AMIKACIN in the last 72 hours.   Microbiology: No results found for this or any previous visit (from the past 720 hour(s)).  Medical History: Past Medical History  Diagnosis Date  . LBBB (left bundle branch block)   . Carotid stenosis     a. 10/19/2011 carotid duplex - Mild hard plaque bilaterally. Stable 40-59% bilateral ICA stenosis. Carotid US (04/2013):  Bilateral 40-59% ICA.  F/u 1 year  . Dyslipidemia   . Hypertension   . Chronic combined systolic and diastolic CHF (congestive heart failure)     a. EF as low as 25% in 2006;  b. EF 60-65% in 12/2011;  c. 02/2013 Echo: EF 35-40, mod mid-dist antsept HK, Gr 2 DD, mild LVH.  . Back pain, chronic     "just when I walk; mass on 3rd and 4th vertebrae right lower back"  . Cardiomyopathy, ischemic     a. 2012 s/p SJM 3231-40 Uni BiV ICD, ser # [id number].  Marland Kitchen CAD (coronary artery disease)     a. 05/2001 CABG x4: LIMA->LAD, VG->D1, VG->D2, VG->RCA.  Marland Kitchen Retinopathy due to secondary diabetes      type II, uncontrolled  . CKD (chronic kidney disease), stage III   . Biventricular implantable cardiac defibrillator in situ 2007, 2012    a. 2007;  b. 2012 Gen change: SJM 3231-40 Uni BiV ICD, ser # [id number].  Marland Kitchen Hypoxemia requiring supplemental oxygen   . Candidiasis of vulva and vagina   . Hypothyroidism   . 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 Benay Spice, in remission  . History of pneumonia 12/27/11    "3 times; it's been a long time ago"  . OSA on CPAP   . Gout ~ 08/2011  . PAF (paroxysmal atrial fibrillation)     on Coumadin  . DM (diabetes mellitus), type 2 with complications     insulin dependent, retinopathy, neuropathy  . Mural thrombus of left ventricle     before 2003, while on Coumadin, No h/o CVA  . Morbid obesity with BMI of 45.0-49.9, adult     Ht. 5'4". BMI 47.2  . Vitamin D deficiency 06/09/2012    historical  . Olecranon bursitis of right elbow 12/29/2012  . History of recurrent UTIs   . Diabetes 05/24/2013  . Hypercalcemia 09/26/2013  . Anemia, unspecified 12/13/2013  . Renal insufficiency 03/14/2014    Following with [facility], Dr Loyal Buba  . Medicare annual wellness visit, subsequent 09/12/2014    Medication: Reviewed  with patient and updated  Preferred Pharmacy and which med where: 90 day supply/mail order: [place] [facility]: [place]  DM supplies: [facility]   Allergies verified: UTD   Immunization Status: Flu vaccine-- 01/12/14 Tdap-- 01/28/12 PNA-- 03/11/13 (23)  Shingles--08/19/07  MMG-- 06/25/08 with Johnnette Gourd, [facility] at [facility]    Medications:  Albuterol  Norvasc  Lipitor  Coreg  Enalapril  Lexapro  Tricor  Flonase  Neurontin NPH  Novolin R  Synthroid  Ativan  Fish oil  Demadex   Coumadin 7.5 mg daily except 5 mg MonFri  Assessment: 74 y.o. female with SOB, possible PNA, for empiric antibiotics  Vancomycin 2 g IV given in ED at 0200  Goal of Therapy:   Vancomycin trough level 15-20 mcg/ml  Plan:  Vancomycin 750 mg IV q24h Cefepime 1 g IV q24h  Abbott, Bronson Curb 09/26/2014,2:36 AM   Based on renal function and weight, will change dose to 1750 mg IV q48h.   Cassie L. Nicole Kindred, PharmD Clinical Pharmacy Resident Pager: [phone] 09/26/2014 10:32 AM

## 2014-09-26 NOTE — Progress Notes (Addendum)
ANTICOAGULATION CONSULT NOTE - Follow Up Consult  Pharmacy Consult for warfarin Indication: atrial fibrillation  Allergies  Allergen Reactions  . Sulfonamide Derivatives Other (See Comments)    Unknown; "childhood allergy/mother"    Patient Measurements: Height: 5\' 4"  (162.6 cm) Weight: 268 lb 15.4 oz (122 kg) IBW/kg (Calculated) : 54.7  Vital Signs: Temp: 98.2 F (36.8 C) (05/15 0731) Temp Source: Axillary (05/15 0731) BP: 103/45 mmHg (05/15 0731) Pulse Rate: 83 (05/15 0731)  Labs:  Recent Labs  09/25/14 2159 09/25/14 2254 09/26/14 0148 09/26/14 0333 09/26/14 0446  HGB 11.6*  --   --  10.8*  --   HCT 37.3  --   --  33.2*  --   PLT 239  --   --  211  --   APTT 33  --   --   --   --   LABPROT 32.8*  --   --   --  31.0*  INR 3.18*  --   --   --  2.95*  CREATININE  --  2.49* 2.49*  --  2.50*  TROPONINI  --   --   --  0.03  --     Estimated Creatinine Clearance: 26.2 mL/min (by C-G formula based on Cr of 2.5).   Medications:  Scheduled:  . atorvastatin  20 mg Oral Daily  . ceFEPime (MAXIPIME) IV  1 g Intravenous Q24H  . furosemide  80 mg Intravenous 3 times per day  . gabapentin  300 mg Oral BID  . insulin aspart  0-9 Units Subcutaneous 6 times per day  . insulin NPH Human  20 Units Subcutaneous QHS  . insulin NPH Human  45 Units Subcutaneous Q breakfast  . levothyroxine  25 mcg Oral QAC breakfast  . sodium chloride  3 mL Intravenous Q12H  . sodium chloride  3 mL Intravenous Q12H  . sodium polystyrene  45 g Oral Once  . [START ON 09/27/2014] vancomycin  750 mg Intravenous Q24H  . Warfarin - Pharmacist Dosing Inpatient   Does not apply q1800    Assessment: 74 yo m admitted on 5/15 for AoC respiratory failure, hyperkalemia, and AoCHF. Patient is on warfarin PTA (7.5 mg daily except 5 mg on Monday and Friday) for afib. Pharmacy has been consulted to dose warfarin, last dose 5/13 @ 1900. INR 3.18 on admission, 2.95 today. Hgb 10.8, plts 211, no bleeding or issues  noted.  Goal of Therapy:  INR 2-3 Monitor platelets by anticoagulation protocol: Yes   Plan:  Warfarin 5 mg x 1 tonight F/u INR in AM to determine further dosing Monitor hgb/plts, s/s of bleeding, clinical course  Cassie L. Nicole Kindred, PharmD Clinical Pharmacy Resident Pager: [phone] 09/26/2014 10:11 AM

## 2014-09-26 NOTE — Progress Notes (Signed)
[place] TEAM 1 - Stepdown/ICU TEAM Progress Note  Destiny [name] OTL:[id number] DOB: Jun 28, 1940 DOA: 09/25/2014 PCP: Penni Homans, MD  Admit HPI / Brief Narrative: 74 year old female with history of morbid obesity, DM, paroxysmal atrial fibrillation on coumadin, chronic resp failure on oxygen, chronic combined systolic and diastolic CHF, CAD s/p CABG, and stage 3 CKD who presented with progressive weakness and shortness of breath over 2 days. No fevers or chills. Had noted an increase in baseline weight.   In the ED her heart rate was into 120s, respirations in the 40s, blood pressures in the 100s to 140s, satting 88% on 3-4 L, requiring BiPAP. White blood cell count 18, creatinine 2.5, potassium 6, INR 3.2, blood sugar 332. ABG with a compensated mild respiratory acidosis. Lactate 2.3-1.2. BNP 238. Dry weight 259 pounds. Chest x-ray with cardiac enlargement with pulmonary vascular congestion, perihilar edema and superimposed consolidation in the right upper lung.  HPI/Subjective: Follow-up visit completed  Assessment/Plan:  Acute on chronic respiratory failure with hypoxia  RUL HCAP w/ Sepsis   Chronic mixed diastolic and systolic CHF dry weight 974-163 lbs - current wgt 268lbs - TTE 08/2013 EF about 40%  Hyperkalemia  Atrial fibrillation S/P DCCV 07/28/14  CAD s/p CABG 2003  DM2  CKD Stage 3 (GFR 30-59 ml/min) Baseline crt 2.0-2.25  Morbid obesity - Body mass index is 46.14 kg/(m^2).   Hypothyroidism  Hyperlipidemia  Code Status: FULL Family Communication: no family present at time of exam Disposition Plan: SDU  Consultants: none  Procedures: none  Antibiotics: Cefepime 5/14 > Vanc 5/14 >  DVT prophylaxis: warfarin  Objective: Blood pressure 103/45, pulse 83, temperature 98.2 F (36.8 C), temperature source Axillary, resp. rate 18, height 5\' 4"  (1.626 m), weight 122 kg (268 lb 15.4 oz), SpO2 98 %.  Intake/Output Summary (Last 24 hours) at 09/26/14  0735 Last data filed at 09/26/14 0451  Gross per 24 hour  Intake    550 ml  Output    100 ml  Net    450 ml   Exam: Follow-up visit completed  Data Reviewed: Basic Metabolic Panel:  Recent Labs Lab 09/25/14 2254 09/26/14 0148 09/26/14 0446  NA 133* 134* 135  K 6.0* 5.5* 5.6*  CL 98* 100* 102  CO2 21* 24 22  GLUCOSE 395* 332* 301*  BUN 60* 59* 60*  CREATININE 2.49* 2.49* 2.50*  CALCIUM 8.4* 8.6* 8.3*    CBC:  Recent Labs Lab 09/25/14 2159 09/26/14 0333  WBC 18.0* 12.5*  NEUTROABS  --  10.0*  HGB 11.6* 10.8*  HCT 37.3 33.2*  MCV 91.2 89.7  PLT 239 211   Liver Function Tests:  Recent Labs Lab 09/25/14 2254 09/26/14 0446  AST 28 16  ALT 16 14  ALKPHOS 53 49  BILITOT 1.1 0.8  PROT 6.5 6.2*  ALBUMIN 3.4* 3.2*   Coags:  Recent Labs Lab 09/25/14 2159 09/26/14 0446  INR 3.18* 2.95*    Recent Labs Lab 09/25/14 2159  APTT 33    Cardiac Enzymes:  Recent Labs Lab 09/26/14 0333  TROPONINI 0.03    Studies:   Recent x-ray studies have been reviewed in detail by the Attending Physician  Scheduled Meds:  Scheduled Meds: . atorvastatin  20 mg Oral Daily  . ceFEPime (MAXIPIME) IV  1 g Intravenous Q24H  . furosemide  80 mg Intravenous 3 times per day  . gabapentin  300 mg Oral BID  . insulin aspart  0-9 Units Subcutaneous 6 times per  day  . insulin NPH Human  20 Units Subcutaneous QHS  . insulin NPH Human  45 Units Subcutaneous Q breakfast  . levothyroxine  25 mcg Oral QAC breakfast  . sodium chloride  3 mL Intravenous Q12H  . sodium chloride  3 mL Intravenous Q12H  . sodium polystyrene  45 g Oral Once  . [START ON 09/27/2014] vancomycin  750 mg Intravenous Q24H  . Warfarin - Pharmacist Dosing Inpatient   Does not apply q1800    Time spent on care of this patient: no charge    Cherene Altes , MD   [facility] Office  [phone] Pager - Text Page per Amion as per below:  On-Call/Text Page:      [email or web address]      password  [email or web address]  If 7PM-7AM, please contact night-coverage [email or web address] Password [email or web address] 09/26/2014, 7:35 AM   LOS: 0 days

## 2014-09-26 NOTE — Progress Notes (Signed)
Utilization review completed.  

## 2014-09-26 NOTE — ED Notes (Signed)
Flow called to inquire about bed status.  

## 2014-09-26 NOTE — Progress Notes (Signed)
Pt asked to come off BiPAP; weaned to 4L [place]. Will continue to monitor.

## 2014-09-26 NOTE — ED Notes (Signed)
Admitting MD at bedside.

## 2014-09-27 DIAGNOSIS — R0902 Hypoxemia: Secondary | ICD-10-CM

## 2014-09-27 DIAGNOSIS — J9620 Acute and chronic respiratory failure, unspecified whether with hypoxia or hypercapnia: Secondary | ICD-10-CM

## 2014-09-27 DIAGNOSIS — I5043 Acute on chronic combined systolic (congestive) and diastolic (congestive) heart failure: Secondary | ICD-10-CM | POA: Insufficient documentation

## 2014-09-27 LAB — CBC WITH DIFFERENTIAL/PLATELET
Basophils Absolute: 0 10*3/uL (ref 0.0–0.1)
Basophils Relative: 0 % (ref 0–1)
EOS ABS: 0 10*3/uL (ref 0.0–0.7)
Eosinophils Relative: 0 % (ref 0–5)
HCT: 33.7 % — ABNORMAL LOW (ref 36.0–46.0)
HEMOGLOBIN: 10.8 g/dL — AB (ref 12.0–15.0)
LYMPHS PCT: 14 % (ref 12–46)
Lymphs Abs: 1.7 10*3/uL (ref 0.7–4.0)
MCH: 28.9 pg (ref 26.0–34.0)
MCHC: 32 g/dL (ref 30.0–36.0)
MCV: 90.1 fL (ref 78.0–100.0)
MONOS PCT: 9 % (ref 3–12)
Monocytes Absolute: 1.2 10*3/uL — ABNORMAL HIGH (ref 0.1–1.0)
NEUTROS ABS: 9.5 10*3/uL — AB (ref 1.7–7.7)
NEUTROS PCT: 77 % (ref 43–77)
Platelets: 201 10*3/uL (ref 150–400)
RBC: 3.74 MIL/uL — ABNORMAL LOW (ref 3.87–5.11)
RDW: 14.6 % (ref 11.5–15.5)
WBC: 12.5 10*3/uL — AB (ref 4.0–10.5)

## 2014-09-27 LAB — GLUCOSE, CAPILLARY
GLUCOSE-CAPILLARY: 186 mg/dL — AB (ref 65–99)
GLUCOSE-CAPILLARY: 149 mg/dL — AB (ref 65–99)
GLUCOSE-CAPILLARY: 129 mg/dL — AB (ref 65–99)
Glucose-Capillary: 266 mg/dL — ABNORMAL HIGH (ref 65–99)
Glucose-Capillary: 180 mg/dL — ABNORMAL HIGH (ref 65–99)
Glucose-Capillary: 128 mg/dL — ABNORMAL HIGH (ref 65–99)

## 2014-09-27 LAB — COMPREHENSIVE METABOLIC PANEL
ALT: 15 U/L (ref 14–54)
AST: 18 U/L (ref 15–41)
Albumin: 3.4 g/dL — ABNORMAL LOW (ref 3.5–5.0)
Alkaline Phosphatase: 45 U/L (ref 38–126)
Anion gap: 12 (ref 5–15)
BUN: 60 mg/dL — AB (ref 6–20)
CO2: 24 mmol/L (ref 22–32)
CREATININE: 2.5 mg/dL — AB (ref 0.44–1.00)
Calcium: 8.5 mg/dL — ABNORMAL LOW (ref 8.9–10.3)
Chloride: 97 mmol/L — ABNORMAL LOW (ref 101–111)
GFR calc non Af Amer: 18 mL/min — ABNORMAL LOW (ref >60)
GFR, EST AFRICAN AMERICAN: 21 mL/min — AB (ref >60)
GLUCOSE: 271 mg/dL — AB (ref 65–99)
Potassium: 4.7 mmol/L (ref 3.5–5.1)
Sodium: 133 mmol/L — ABNORMAL LOW (ref 135–145)
TOTAL PROTEIN: 6.5 g/dL (ref 6.5–8.1)
Total Bilirubin: 0.9 mg/dL (ref 0.3–1.2)

## 2014-09-27 LAB — PROTIME-INR
INR: 2.33 — ABNORMAL HIGH (ref 0.00–1.49)
Prothrombin Time: 25.7 seconds — ABNORMAL HIGH (ref 11.6–15.2)

## 2014-09-27 LAB — BLOOD GAS, ARTERIAL
Acid-Base Excess: 2 mmol/L (ref 0.0–2.0)
BICARBONATE: 26.3 meq/L — AB (ref 20.0–24.0)
Delivery systems: POSITIVE
Drawn by: 270271
Expiratory PAP: 10
O2 CONTENT: 5 L/min
O2 Saturation: 92.5 %
PCO2 ART: 43.4 mmHg (ref 35.0–45.0)
PH ART: 7.401 (ref 7.350–7.450)
Patient temperature: 98.6
TCO2: 27.7 mmol/L (ref 0–100)
pO2, Arterial: 65.6 mmHg — ABNORMAL LOW (ref 80.0–100.0)

## 2014-09-27 LAB — HEMOGLOBIN A1C
Hgb A1c MFr Bld: 7.2 % — ABNORMAL HIGH (ref 4.8–5.6)
Mean Plasma Glucose: 160 mg/dL

## 2014-09-27 LAB — PROCALCITONIN: Procalcitonin: 1.27 ng/mL

## 2014-09-27 MED ORDER — SALINE SPRAY 0.65 % NA SOLN
1.0000 | NASAL | Status: DC | PRN
Start: 2014-09-27 — End: 2014-09-30
  Filled 2014-09-27: qty 44

## 2014-09-27 MED ORDER — WARFARIN SODIUM 7.5 MG PO TABS
7.5000 mg | ORAL_TABLET | Freq: Once | ORAL | Status: AC
  Administered 2014-09-27: 7.5 mg via ORAL
  Filled 2014-09-27 (×2): qty 1

## 2014-09-27 MED ORDER — DILTIAZEM HCL 25 MG/5ML IV SOLN
5.0000 mg | Freq: Once | INTRAVENOUS | Status: AC
  Administered 2014-09-27: 5 mg via INTRAVENOUS
  Filled 2014-09-27: qty 5

## 2014-09-27 MED ORDER — PHENOL 1.4 % MT LIQD
1.0000 | OROMUCOSAL | Status: DC | PRN
  Filled 2014-09-27: qty 177

## 2014-09-27 MED ORDER — ONDANSETRON HCL 4 MG/2ML IJ SOLN
4.0000 mg | Freq: Four times a day (QID) | INTRAMUSCULAR | Status: DC | PRN
Start: 2014-09-27 — End: 2014-09-30

## 2014-09-27 NOTE — Progress Notes (Signed)
Pt having anxiety attack, asked me to call husband, i did  HR 140's afib, o2 increased from 4 l via nasal cannula 15 liters   With venturi mask. Respirations increased. Gave 2 doses of 0.5 of ativan with out success. Md ordered one  Time dose of cardizem husband arrived from home with pt cpap placed on patient by husband at 10 liters. Hr now 100-110's oxygen saturations 89-94% patient much calmer will continue to monitor.

## 2014-09-27 NOTE — Progress Notes (Signed)
Patient to transfer to 3E09 report given to receiving nurse, all questions answered at this time.  Pt. VSS with no s/s of distress noted.  Patient stable at transfer.

## 2014-09-27 NOTE — Progress Notes (Signed)
Earlier in shift, RN paged because pt was having a "panic attack". She asked for Ativan in order to tolerate Bipap. However, after 2 small doses of Ativan, she refused to wear Bipap. Husband was called and he came to hospital. Pt more calm after that. Husband brought her nasal CPAP from home and she has been wearing this with supplemental O2 as well to keep O2 sat>90%. ABG was done given anxiety to r/o hypoxia. ABG looked OK except for low PO2 and after that, pt was given supplemental O2 with CPAP. No problems reported since then.  Clance Boll, NP [facility]

## 2014-09-27 NOTE — Progress Notes (Signed)
Transfer report received from 3S at 1730 and pt arrived to the unit at 1805 via wheelchair with husband and belongings to the side and on 4L oxygen. Pt denies any pain, VSS, telemetry applied and verified; pt oriented to the unit and room. Pt sitting up in bed to eat dinner; call light within reach and spouse remains at bedside with pt remaining on 4L oxygen. Skin intact with no pressure ulcer noted. Will closely monitor pt and report off to incoming RN. P. Amo Harm Jou Rn.

## 2014-09-27 NOTE — Progress Notes (Signed)
Patient request throat spray for a sore throat and nasal congestion. Text paged and notified hospitalist. Will carry out orders given and continue to monitor patient to end of shift.

## 2014-09-27 NOTE — Progress Notes (Signed)
Pt require high usage of oxygen refusing to wear hospital bipap even after 2 doses of Ativan NP notified will continue monitor

## 2014-09-27 NOTE — Progress Notes (Signed)
Inpatient Diabetes Program Recommendations  AACE/ADA: New Consensus Statement on Inpatient Glycemic Control (2013)  Target Ranges:  Prepandial:   less than 140 mg/dL      Peak postprandial:   less than 180 mg/dL (1-2 hours)      Critically ill patients:  140 - 180 mg/dL   Results for Destiny [name], Destiny [name] (MRN [id number]) as of 09/27/2014 11:40  Ref. Range 09/26/2014 07:34 09/26/2014 11:40 09/26/2014 17:13 09/26/2014 21:22 09/27/2014 08:02  Glucose-Capillary Latest Ref Range: 65-99 mg/dL 193 (H) 135 (H) 235 (H) 361 (H) 180 (H)   Glucose high now that pt is eating. On home doses of NPH, but only on sensitive correction tidwc. Pt takes 15 units humalog with meals at home. HS glucose was high-thus fasting this am is elevated. Please consider recommendation below. Inpatient Diabetes Program Recommendations Insulin - Meal Coverage:  Pt takes 15 units humalog meal coverage tidwc. Please consider ordering meal coverage of 4-6 units tidwc while here. Pt is no longer NPO-eating 100% of meals  Thank you Rosita Kea, RN, MSN, CDE  Diabetes Inpatient Program Office: [phone] Pager: [phone] 8:00 am to 5:00 pm

## 2014-09-27 NOTE — Progress Notes (Signed)
Medicare Important Message given? YES (If response is "NO", the following Medicare IM given date fields will be blank) Date Medicare IM given:09/27/14 Medicare IM given by: Johnn Krasowski 

## 2014-09-27 NOTE — Progress Notes (Signed)
Pt's family member stated he would be placing pt on cpap for the night when pt is ready

## 2014-09-27 NOTE — Progress Notes (Addendum)
[facility] TEAM 1 - Stepdown/ICU TEAM Progress Note  Destiny [name]:[id number] DOB: [birth year] DOA: 09/25/2014 PCP: Penni Homans, MD  Admit HPI / Brief Narrative: 74 year old female with history of morbid obesity, DM, paroxysmal atrial fibrillation on coumadin, chronic resp failure on oxygen, chronic combined systolic and diastolic CHF, CAD s/p CABG, and stage 3 CKD who presented with progressive weakness and shortness of breath over 2 days. No fevers or chills. Had noted an increase in baseline weight.   In the ED her heart rate was into 120s, respirations in the 40s, blood pressures in the 100s to 140s, satting 88% on 3-4 L, requiring BiPAP. White blood cell count 18, creatinine 2.5, potassium 6, INR 3.2, blood sugar 332. ABG with a compensated mild respiratory acidosis. Lactate 2.3-1.2. BNP 238. Dry weight 259 pounds. Chest x-ray with cardiac enlargement with pulmonary vascular congestion, perihilar edema and superimposed consolidation in the right upper lung.  HPI/Subjective: Patient is alert and conversant and quite cheerful.  She denies shortness of breath at present.  She states she feels much better.  She denies abdominal pain chest pain nausea or vomiting.  She states that her appetite is improving.  Assessment/Plan:  Acute on chronic respiratory failure with hypoxia Has been liberated from BiPAP - oxygen saturations 93% at present on 4 L - continue to wean oxygen as able  RUL HCAP w/ Sepsis  Continue empiric antibiotic - white count stable - clinically slowly improving - begin ambulating  Acute exacerbation of Chronic mixed diastolic and systolic CHF dry weight 258-527 lbs - current wgt 265lbs - TTE 08/2013 EF about 40% - continue IV diuretic and follow I's/O's plus weights  Hyperkalemia Resolved  Atrial fibrillation S/P DCCV 07/28/14 - patient does appear to be back in atrial fibrillation but her heart rate is controlled  CAD s/p CABG 2003 Asymptomatic  DM2 CBGs poorly  controlled last night but improved today - follow without change at this time  CKD Stage 3 (GFR 30-59 ml/min) Baseline crt 2.0-2.25 - creatinine slightly elevated today - follow trend  Morbid obesity - Body mass index is 45.61 kg/(m^2).   Hypothyroidism Continue home medical therapy  Hyperlipidemia Continue home medical therapy  Code Status: FULL Family Communication: no family present at time of exam Disposition Plan: Stable for transfer to CHF floor - PT OT - wean oxygen as able  Consultants: none  Procedures: none  Antibiotics: Cefepime 5/14 > Vanc 5/14 >  DVT prophylaxis: warfarin  Objective: Blood pressure 118/53, pulse 88, temperature 99.7 F (37.6 C), temperature source Oral, resp. rate 27, height 5\' 4"  (1.626 m), weight 120.6 kg (265 lb 14 oz), SpO2 93 %.  Intake/Output Summary (Last 24 hours) at 09/27/14 1456 Last data filed at 09/27/14 1338  Gross per 24 hour  Intake    840 ml  Output   2500 ml  Net  -1660 ml   Exam: General: No acute respiratory distress - alert and conversant Lungs: Mild bibasilar crackles with no wheeze Cardiovascular: Irregular rhythm with controlled rate without appreciable murmur or rub Abdomen: Nontender, nondistended, soft, bowel sounds positive, no rebound, no ascites, no appreciable mass Extremities: No significant cyanosis, or clubbing, 1+ edema bilateral lower extremities   Data Reviewed: Basic Metabolic Panel:  Recent Labs Lab 09/25/14 2254 09/26/14 0148 09/26/14 0446 09/27/14 0220  NA 133* 134* 135 133*  K 6.0* 5.5* 5.6* 4.7  CL 98* 100* 102 97*  CO2 21* 24 22 24   GLUCOSE 395* 332* 301* 271*  BUN 60* 59* 60* 60*  CREATININE 2.49* 2.49* 2.50* 2.50*  CALCIUM 8.4* 8.6* 8.3* 8.5*    CBC:  Recent Labs Lab 09/25/14 2159 09/26/14 0333 09/27/14 0220  WBC 18.0* 12.5* 12.5*  NEUTROABS  --  10.0* 9.5*  HGB 11.6* 10.8* 10.8*  HCT 37.3 33.2* 33.7*  MCV 91.2 89.7 90.1  PLT 239 211 201   Liver Function  Tests:  Recent Labs Lab 09/25/14 2254 09/26/14 0446 09/27/14 0220  AST 28 16 18   ALT 16 14 15   ALKPHOS 53 49 45  BILITOT 1.1 0.8 0.9  PROT 6.5 6.2* 6.5  ALBUMIN 3.4* 3.2* 3.4*   Coags:  Recent Labs Lab 09/25/14 2159 09/26/14 0446 09/27/14 0220  INR 3.18* 2.95* 2.33*    Recent Labs Lab 09/25/14 2159  APTT 33    Cardiac Enzymes:  Recent Labs Lab 09/26/14 0333 09/26/14 1030 09/26/14 1418  TROPONINI 0.03 0.04* 0.05*    Studies:   Recent x-ray studies have been reviewed in detail by the Attending Physician  Scheduled Meds:  Scheduled Meds: . atorvastatin  20 mg Oral Daily  . carvedilol  12.5 mg Oral BID WC  . ceFEPime (MAXIPIME) IV  1 g Intravenous Q24H  . escitalopram  10 mg Oral Daily  . furosemide  80 mg Intravenous 3 times per day  . gabapentin  300 mg Oral BID  . insulin aspart  0-9 Units Subcutaneous TID WC  . insulin NPH Human  20 Units Subcutaneous QHS  . insulin NPH Human  45 Units Subcutaneous Q breakfast  . levothyroxine  25 mcg Oral QAC breakfast  . [START ON 09/28/2014] vancomycin  1,750 mg Intravenous Q48H  . warfarin  7.5 mg Oral ONCE-1800  . Warfarin - Pharmacist Dosing Inpatient   Does not apply q1800    Time spent on care of this patient: 66 minutes    Karas Pickerill T , MD   [facility] Office  [phone] Pager - Text Page per Amion as per below:  On-Call/Text Page:      [email or web address]      password [email or web address]  If 7PM-7AM, please contact night-coverage [email or web address] Password [email or web address] 09/27/2014, 2:56 PM   LOS: 1 day

## 2014-09-27 NOTE — Progress Notes (Signed)
ANTICOAGULATION CONSULT NOTE - Follow Up Consult  Pharmacy Consult for warfarin Indication: atrial fibrillation  Allergies  Allergen Reactions  . Sulfonamide Derivatives Other (See Comments)    Unknown; "childhood allergy/mother"    Patient Measurements: Height: 5\' 4"  (162.6 cm) Weight: 265 lb 14 oz (120.6 kg) IBW/kg (Calculated) : 54.7  Vital Signs: Temp: 98 F (36.7 C) (05/16 0806) Temp Source: Axillary (05/16 0806) BP: 134/95 mmHg (05/16 0806) Pulse Rate: 101 (05/16 0806)  Labs:  Recent Labs  09/25/14 2159  09/26/14 0148 09/26/14 0333 09/26/14 0446 09/26/14 1030 09/26/14 1418 09/27/14 0220  HGB 11.6*  --   --  10.8*  --   --   --  10.8*  HCT 37.3  --   --  33.2*  --   --   --  33.7*  PLT 239  --   --  211  --   --   --  201  APTT 33  --   --   --   --   --   --   --   LABPROT 32.8*  --   --   --  31.0*  --   --  25.7*  INR 3.18*  --   --   --  2.95*  --   --  2.33*  CREATININE  --   < > 2.49*  --  2.50*  --   --  2.50*  TROPONINI  --   --   --  0.03  --  0.04* 0.05*  --   < > = values in this interval not displayed.  Estimated Creatinine Clearance: 26 mL/min (by C-G formula based on Cr of 2.5).  Assessment: 74 yo m admitted on 5/15 for AoC respiratory failure, hyperkalemia, and AoCHF. Patient is on warfarin PTA (7.5 mg daily except 5 mg on Monday and Friday) for afib. Pharmacy has been consulted to dose warfarin, last dose 5/13 @ 1900. INR 3.18 on admission, 2.33 today. Hgb 10.8, plts 201, no bleeding or issues noted.  Goal of Therapy:  INR 2-3 Monitor platelets by anticoagulation protocol: Yes   Plan:  Warfarin 7.5 mg x 1 tonight F/u INR in AM to determine further dosing Monitor hgb/plts, s/s of bleeding, clinical course  Levester Fresh, PharmD, BCPS Clinical Pharmacist 5/16/201610:12 AM

## 2014-09-28 ENCOUNTER — Telehealth: Payer: Self-pay | Admitting: Internal Medicine

## 2014-09-28 LAB — CBC WITH DIFFERENTIAL/PLATELET
BASOS PCT: 0 % (ref 0–1)
Basophils Absolute: 0 10*3/uL (ref 0.0–0.1)
Eosinophils Absolute: 0.3 10*3/uL (ref 0.0–0.7)
Eosinophils Relative: 3 % (ref 0–5)
HEMATOCRIT: 30.1 % — AB (ref 36.0–46.0)
Hemoglobin: 9.7 g/dL — ABNORMAL LOW (ref 12.0–15.0)
Lymphocytes Relative: 18 % (ref 12–46)
Lymphs Abs: 1.9 10*3/uL (ref 0.7–4.0)
MCH: 28.9 pg (ref 26.0–34.0)
MCHC: 32.2 g/dL (ref 30.0–36.0)
MCV: 89.6 fL (ref 78.0–100.0)
MONOS PCT: 9 % (ref 3–12)
Monocytes Absolute: 0.9 10*3/uL (ref 0.1–1.0)
Neutro Abs: 7.5 10*3/uL (ref 1.7–7.7)
Neutrophils Relative %: 70 % (ref 43–77)
Platelets: 160 10*3/uL (ref 150–400)
RBC: 3.36 MIL/uL — AB (ref 3.87–5.11)
RDW: 14.4 % (ref 11.5–15.5)
WBC: 10.7 10*3/uL — AB (ref 4.0–10.5)

## 2014-09-28 LAB — GLUCOSE, CAPILLARY
GLUCOSE-CAPILLARY: 192 mg/dL — AB (ref 65–99)
GLUCOSE-CAPILLARY: 181 mg/dL — AB (ref 65–99)
Glucose-Capillary: 258 mg/dL — ABNORMAL HIGH (ref 65–99)
Glucose-Capillary: 236 mg/dL — ABNORMAL HIGH (ref 65–99)

## 2014-09-28 LAB — COMPREHENSIVE METABOLIC PANEL
ALT: 14 U/L (ref 14–54)
AST: 16 U/L (ref 15–41)
Albumin: 2.9 g/dL — ABNORMAL LOW (ref 3.5–5.0)
Alkaline Phosphatase: 42 U/L (ref 38–126)
Anion gap: 10 (ref 5–15)
BUN: 71 mg/dL — AB (ref 6–20)
CO2: 28 mmol/L (ref 22–32)
CREATININE: 2.65 mg/dL — AB (ref 0.44–1.00)
Calcium: 8.5 mg/dL — ABNORMAL LOW (ref 8.9–10.3)
Chloride: 96 mmol/L — ABNORMAL LOW (ref 101–111)
GFR calc Af Amer: 20 mL/min — ABNORMAL LOW (ref >60)
GFR, EST NON AFRICAN AMERICAN: 17 mL/min — AB (ref >60)
Glucose, Bld: 177 mg/dL — ABNORMAL HIGH (ref 65–99)
Potassium: 4.4 mmol/L (ref 3.5–5.1)
Sodium: 134 mmol/L — ABNORMAL LOW (ref 135–145)
Total Bilirubin: 1 mg/dL (ref 0.3–1.2)
Total Protein: 6.1 g/dL — ABNORMAL LOW (ref 6.5–8.1)

## 2014-09-28 LAB — PROTIME-INR
INR: 2.03 — ABNORMAL HIGH (ref 0.00–1.49)
PROTHROMBIN TIME: 23.1 s — AB (ref 11.6–15.2)

## 2014-09-28 MED ORDER — WARFARIN SODIUM 7.5 MG PO TABS
7.5000 mg | ORAL_TABLET | Freq: Once | ORAL | Status: AC
  Administered 2014-09-28: 7.5 mg via ORAL
  Filled 2014-09-28: qty 1

## 2014-09-28 NOTE — Telephone Encounter (Signed)
New Messag  Pt husband wanted to let our office/Dr. Lovena Le know that pt was admitted to [facility] for fluid on lungs and possible pneumonia.

## 2014-09-28 NOTE — Progress Notes (Signed)
Please make note during the night patient stated sore throat and nasal congestion. When throat spray was brought into the room, patient stated she did not state that her throat hurt but just needed something for nasal congestion. Notified the doctor once more. Orders given for nasal congestion and was given as ordered.   Also husband states that patient needs a continuous pulse oximeter for he was concerned of her oxygen saturation dropping while on her CPAP machine. Husband states that whatever her oxygen saturation is he adjust the CPAP to the result showing on continuous pulse ox. Informed and educated husband not to adjust oxygen meter on the wall but stated other nurses on another floor allowed him to do that otherwise he stated she would have to be intubated like before in the past. Once more explained to husband he is not supposed to touch oxygen meter on the wall for safety issues respiratory wise for the patient and as a general rule. Husband stated he understood but did not comply. Please address when making rounds. Thanks.

## 2014-09-28 NOTE — Telephone Encounter (Signed)
Dr. Taylor aware. °

## 2014-09-28 NOTE — Discharge Instructions (Signed)

## 2014-09-28 NOTE — Care Management Note (Signed)
Case Management Note  Patient Details  Name: Destiny [name] MRN: [id number] Date of Birth: 1940-08-25  Subjective/Objective:      Pt admitted on 09/25/14 with CHF/CAP.  PTA, pt resides at home with spouse.                Action/Plan: Will follow for discharge needs as pt progresses.    Expected Discharge Date:                  Expected Discharge Plan:  [place]  In-House Referral:     Discharge planning Services  CM Consult  Post Acute Care Choice:    Choice offered to:     DME Arranged:    DME Agency:     HH Arranged:    [place] Agency:     Status of Service:  In process, will continue to follow  Medicare Important Message Given:    Date Medicare IM Given:    Medicare IM give by:    Date Additional Medicare IM Given:    Additional Medicare Important Message give by:     If discussed at [place] of Stay Meetings, dates discussed:    Additional Comments:  Ella Bodo, RN 09/28/2014, 4:05 PM Phone [phone]

## 2014-09-28 NOTE — Progress Notes (Signed)
ANTICOAGULATION CONSULT NOTE - Follow Up Consult  Pharmacy Consult for warfarin Indication: atrial fibrillation  Allergies  Allergen Reactions  . Sulfonamide Derivatives Other (See Comments)    Unknown; "childhood allergy/mother"    Patient Measurements: Height: 5\' 4"  (162.6 cm) Weight: 262 lb (118.842 kg) (a scale) IBW/kg (Calculated) : 54.7  Vital Signs: Temp: 98.2 F (36.8 C) (05/17 1101) Temp Source: Oral (05/17 1101) BP: 128/72 mmHg (05/17 1101) Pulse Rate: 73 (05/17 1101)  Labs:  Recent Labs  09/25/14 2159  09/26/14 0333 09/26/14 0446 09/26/14 1030 09/26/14 1418 09/27/14 0220 09/28/14 0241  HGB 11.6*  --  10.8*  --   --   --  10.8* 9.7*  HCT 37.3  --  33.2*  --   --   --  33.7* 30.1*  PLT 239  --  211  --   --   --  201 160  APTT 33  --   --   --   --   --   --   --   LABPROT 32.8*  --   --  31.0*  --   --  25.7* 23.1*  INR 3.18*  --   --  2.95*  --   --  2.33* 2.03*  CREATININE  --   < >  --  2.50*  --   --  2.50* 2.65*  TROPONINI  --   --  0.03  --  0.04* 0.05*  --   --   < > = values in this interval not displayed.  Estimated Creatinine Clearance: 24.3 mL/min (by C-G formula based on Cr of 2.65).  Assessment: 74 yo m admitted on 5/15 for AoC respiratory failure, hyperkalemia, and AoCHF. Patient is on warfarin PTA (7.5 mg daily except 5 mg on Monday and Friday) for afib. Pharmacy has been consulted to dose warfarin, last dose 5/13 @ 1900. INR 3.18 on admission, 2.03 today. Hgb 10.8, plts 201, no bleeding or issues noted.  Goal of Therapy:  INR 2-3 Monitor platelets by anticoagulation protocol: Yes   Plan:  Warfarin 7.5 mg x 1 tonight Daily INR Monitor hgb/plts, s/s of bleeding, clinical course  Levester Fresh, PharmD, BCPS Clinical Pharmacist 5/17/201611:13 AM

## 2014-09-28 NOTE — Progress Notes (Signed)
[place] TEAM 1 - Stepdown/ICU TEAM Progress Note  Destiny [name] IWL:[id number] DOB: 10-04-1940 DOA: 09/25/2014 PCP: Penni Homans, MD  Admit HPI / Brief Narrative: From prior PN 74 year old female with history of morbid obesity, DM, paroxysmal atrial fibrillation on coumadin, chronic resp failure on oxygen, chronic combined systolic and diastolic CHF, CAD s/p CABG, and stage 3 CKD who presented with progressive weakness and shortness of breath over 2 days. No fevers or chills. Had noted an increase in baseline weight.   In the ED her heart rate was into 120s, respirations in the 40s, blood pressures in the 100s to 140s, satting 88% on 3-4 L, requiring BiPAP. White blood cell count 18, creatinine 2.5, potassium 6, INR 3.2, blood sugar 332. ABG with a compensated mild respiratory acidosis. Lactate 2.3-1.2. BNP 238. Dry weight 259 pounds. Chest x-ray with cardiac enlargement with pulmonary vascular congestion, perihilar edema and superimposed consolidation in the right upper lung.  HPI/Subjective: Pt states that she feels better. No new complaints.  Assessment/Plan:  Acute on chronic respiratory failure with hypoxia - wean back to home 2L via [place] as tolerated. - continue current medical management given improvement in condition.  RUL HCAP w/ Sepsis  WBC trending down on current antibiotic regimen - Will consider changing to oral regimen 09/29/2014 with continued improvement in condition  Acute exacerbation of Chronic mixed diastolic and systolic CHF dry weight 174-081 lbs - current wgt 265lbs - TTE 08/2013 EF about 40% - continue IV diuretic and follow I's/O's plus weights  Hyperkalemia Resolved  Atrial fibrillation S/P DCCV 07/28/14 - patient does appear to be back in atrial fibrillation but her heart rate is controlled  CAD s/p CABG 2003 Asymptomatic  DM2 CBGs poorly controlled last night but improved today - follow without change at this time  CKD Stage 3 (GFR 30-59  ml/min) Baseline crt 2.0-2.25 - creatinine slightly elevated today - follow trend  Morbid obesity - Body mass index is 44.95 kg/(m^2).   Hypothyroidism Continue home medical therapy  Hyperlipidemia Continue home medical therapy  Code Status: FULL Family Communication: Discussed with patient and spouse Disposition Plan: Pending improvement in respiratory condition  Consultants: none  Procedures: none  Antibiotics: Cefepime 5/14 > Vanc 5/14 >  DVT prophylaxis: warfarin  Objective: Blood pressure 128/72, pulse 73, temperature 98.2 F (36.8 C), temperature source Oral, resp. rate 20, height 5\' 4"  (1.626 m), weight 262 lb (118.842 kg), SpO2 96 %.  Intake/Output Summary (Last 24 hours) at 09/28/14 1412 Last data filed at 09/28/14 1350  Gross per 24 hour  Intake   1540 ml  Output   1650 ml  Net   -110 ml   Exam: General: Patient in no acute distress, alert and awake Lungs: Mild bibasilar crackles with no wheeze, equal chest rise Cardiovascular: Irregular rhythm with controlled rate without appreciable murmur or rub Abdomen: Nontender, nondistended, soft, bowel sounds positive, no rebound, no ascites, no appreciable mass Extremities: No significant cyanosis, or clubbing, 1+ edema bilateral lower extremities   Data Reviewed: Basic Metabolic Panel:  Recent Labs Lab 09/25/14 2254 09/26/14 0148 09/26/14 0446 09/27/14 0220 09/28/14 0241  NA 133* 134* 135 133* 134*  K 6.0* 5.5* 5.6* 4.7 4.4  CL 98* 100* 102 97* 96*  CO2 21* 24 22 24 28   GLUCOSE 395* 332* 301* 271* 177*  BUN 60* 59* 60* 60* 71*  CREATININE 2.49* 2.49* 2.50* 2.50* 2.65*  CALCIUM 8.4* 8.6* 8.3* 8.5* 8.5*    CBC:  Recent Labs  Lab 09/25/14 2159 09/26/14 0333 09/27/14 0220 09/28/14 0241  WBC 18.0* 12.5* 12.5* 10.7*  NEUTROABS  --  10.0* 9.5* 7.5  HGB 11.6* 10.8* 10.8* 9.7*  HCT 37.3 33.2* 33.7* 30.1*  MCV 91.2 89.7 90.1 89.6  PLT 239 211 201 160   Liver Function Tests:  Recent Labs Lab  09/25/14 2254 09/26/14 0446 09/27/14 0220 09/28/14 0241  AST 28 16 18 16   ALT 16 14 15 14   ALKPHOS 53 49 45 42  BILITOT 1.1 0.8 0.9 1.0  PROT 6.5 6.2* 6.5 6.1*  ALBUMIN 3.4* 3.2* 3.4* 2.9*   Coags:  Recent Labs Lab 09/25/14 2159 09/26/14 0446 09/27/14 0220 09/28/14 0241  INR 3.18* 2.95* 2.33* 2.03*    Recent Labs Lab 09/25/14 2159  APTT 33    Cardiac Enzymes:  Recent Labs Lab 09/26/14 0333 09/26/14 1030 09/26/14 1418  TROPONINI 0.03 0.04* 0.05*    Studies:   Recent x-ray studies have been reviewed in detail by the Attending Physician  Scheduled Meds:  Scheduled Meds: . atorvastatin  20 mg Oral Daily  . carvedilol  12.5 mg Oral BID WC  . ceFEPime (MAXIPIME) IV  1 g Intravenous Q24H  . escitalopram  10 mg Oral Daily  . furosemide  80 mg Intravenous 3 times per day  . gabapentin  300 mg Oral BID  . insulin aspart  0-9 Units Subcutaneous TID WC  . insulin NPH Human  20 Units Subcutaneous QHS  . insulin NPH Human  45 Units Subcutaneous Q breakfast  . levothyroxine  25 mcg Oral QAC breakfast  . vancomycin  1,750 mg Intravenous Q48H  . warfarin  7.5 mg Oral ONCE-1800  . Warfarin - Pharmacist Dosing Inpatient   Does not apply q1800    Time spent on care of this patient: 35 minutes    Velvet Bathe , MD   [facility]  Pager [phone]  If 7PM-7AM, please contact night-coverage [email or web address] Password [email or web address] 09/28/2014, 2:12 PM   LOS: 2 days

## 2014-09-29 ENCOUNTER — Encounter: Payer: Medicare Other | Admitting: Internal Medicine

## 2014-09-29 DIAGNOSIS — N183 Chronic kidney disease, stage 3 (moderate): Secondary | ICD-10-CM

## 2014-09-29 DIAGNOSIS — A419 Sepsis, unspecified organism: Principal | ICD-10-CM

## 2014-09-29 DIAGNOSIS — I5043 Acute on chronic combined systolic (congestive) and diastolic (congestive) heart failure: Secondary | ICD-10-CM

## 2014-09-29 DIAGNOSIS — J9621 Acute and chronic respiratory failure with hypoxia: Secondary | ICD-10-CM

## 2014-09-29 DIAGNOSIS — J189 Pneumonia, unspecified organism: Secondary | ICD-10-CM

## 2014-09-29 DIAGNOSIS — I482 Chronic atrial fibrillation: Secondary | ICD-10-CM

## 2014-09-29 LAB — COMPREHENSIVE METABOLIC PANEL
ALK PHOS: 44 U/L (ref 38–126)
ALT: 14 U/L (ref 14–54)
ANION GAP: 10 (ref 5–15)
AST: 14 U/L — ABNORMAL LOW (ref 15–41)
Albumin: 3 g/dL — ABNORMAL LOW (ref 3.5–5.0)
BILIRUBIN TOTAL: 0.8 mg/dL (ref 0.3–1.2)
BUN: 75 mg/dL — AB (ref 6–20)
CALCIUM: 8.9 mg/dL (ref 8.9–10.3)
CO2: 27 mmol/L (ref 22–32)
Chloride: 99 mmol/L — ABNORMAL LOW (ref 101–111)
Creatinine, Ser: 2.23 mg/dL — ABNORMAL HIGH (ref 0.44–1.00)
GFR calc Af Amer: 24 mL/min — ABNORMAL LOW (ref >60)
GFR, EST NON AFRICAN AMERICAN: 21 mL/min — AB (ref >60)
GLUCOSE: 104 mg/dL — AB (ref 65–99)
Potassium: 4.2 mmol/L (ref 3.5–5.1)
SODIUM: 136 mmol/L (ref 135–145)
Total Protein: 6.4 g/dL — ABNORMAL LOW (ref 6.5–8.1)

## 2014-09-29 LAB — GLUCOSE, CAPILLARY
GLUCOSE-CAPILLARY: 90 mg/dL (ref 65–99)
Glucose-Capillary: 63 mg/dL — ABNORMAL LOW (ref 65–99)
Glucose-Capillary: 261 mg/dL — ABNORMAL HIGH (ref 65–99)
Glucose-Capillary: 271 mg/dL — ABNORMAL HIGH (ref 65–99)
Glucose-Capillary: 304 mg/dL — ABNORMAL HIGH (ref 65–99)

## 2014-09-29 LAB — CBC WITH DIFFERENTIAL/PLATELET
BASOS ABS: 0 10*3/uL (ref 0.0–0.1)
Basophils Relative: 0 % (ref 0–1)
EOS PCT: 4 % (ref 0–5)
Eosinophils Absolute: 0.4 10*3/uL (ref 0.0–0.7)
HCT: 29.6 % — ABNORMAL LOW (ref 36.0–46.0)
Hemoglobin: 9.7 g/dL — ABNORMAL LOW (ref 12.0–15.0)
Lymphocytes Relative: 19 % (ref 12–46)
Lymphs Abs: 1.7 10*3/uL (ref 0.7–4.0)
MCH: 28.9 pg (ref 26.0–34.0)
MCHC: 32.8 g/dL (ref 30.0–36.0)
MCV: 88.1 fL (ref 78.0–100.0)
MONO ABS: 1 10*3/uL (ref 0.1–1.0)
MONOS PCT: 11 % (ref 3–12)
Neutro Abs: 5.9 10*3/uL (ref 1.7–7.7)
Neutrophils Relative %: 66 % (ref 43–77)
Platelets: 184 10*3/uL (ref 150–400)
RBC: 3.36 MIL/uL — ABNORMAL LOW (ref 3.87–5.11)
RDW: 14.3 % (ref 11.5–15.5)
WBC: 9 10*3/uL (ref 4.0–10.5)

## 2014-09-29 LAB — PROTIME-INR
INR: 1.92 — ABNORMAL HIGH (ref 0.00–1.49)
Prothrombin Time: 22.2 seconds — ABNORMAL HIGH (ref 11.6–15.2)

## 2014-09-29 MED ORDER — FUROSEMIDE 10 MG/ML IJ SOLN
40.0000 mg | Freq: Two times a day (BID) | INTRAMUSCULAR | Status: DC
  Administered 2014-09-29 – 2014-09-30 (×2): 40 mg via INTRAVENOUS
  Filled 2014-09-29 (×2): qty 4

## 2014-09-29 MED ORDER — BLISTEX MEDICATED EX OINT
TOPICAL_OINTMENT | CUTANEOUS | Status: DC | PRN
Start: 2014-09-29 — End: 2014-09-30
  Administered 2014-09-29: 07:00:00 via TOPICAL
  Filled 2014-09-29: qty 10

## 2014-09-29 MED ORDER — LEVOFLOXACIN 500 MG PO TABS: 500.0000 mg | ORAL_TABLET | Freq: Every day | ORAL | Status: DC

## 2014-09-29 MED ORDER — VALACYCLOVIR HCL 500 MG PO TABS
2000.0000 mg | ORAL_TABLET | Freq: Two times a day (BID) | ORAL | Status: AC
  Administered 2014-09-29 (×2): 2000 mg via ORAL
  Filled 2014-09-29 (×2): qty 4

## 2014-09-29 MED ORDER — WARFARIN SODIUM 7.5 MG PO TABS
7.5000 mg | ORAL_TABLET | Freq: Once | ORAL | Status: AC
  Administered 2014-09-29: 7.5 mg via ORAL
  Filled 2014-09-29: qty 1

## 2014-09-29 MED ORDER — LEVOFLOXACIN 750 MG PO TABS
750.0000 mg | ORAL_TABLET | Freq: Every day | ORAL | Status: AC
  Administered 2014-09-29: 750 mg via ORAL
  Filled 2014-09-29: qty 1

## 2014-09-29 NOTE — Progress Notes (Signed)
[facility]                                                                              Patient Demographics  Destiny [name], is a 74 y.o. female, DOB - Feb 24, 1941, EVO:[id number]  Admit date - 09/25/2014   Admitting Physician Deneise Lever, MD  Outpatient Primary MD for the patient is Penni Homans, MD  LOS - 3   Chief Complaint  Patient presents with  . Shortness of Breath      HPI on 09/26/2014 by Dr. Shanda Howells This is a 74 y.o. year old female with significant past medical history of morbid obesity, IDDM, paroxysmal atrial fibrillation on coumadin, chronic resp failure on 2, chronic combined systolic and diastolic CHF, CAD s/p CABG, stage 3 CKD presenting with sepsis, acute on chronic resp failure, HCAP, hyperkalemia, acute on chronic mixed CHF. Husband and patient report patient with progressive weakness and shortness of breath over the past 2 days. Symptoms seemed to progressively worsen primarily over the past 24 hours. Reports increased work of breathing. No fevers or chills. Has no some mild increase in baseline weight her husband and patient. Attended take increased doses of torsemide at home however, this was ineffective and denies any increased salt intake nonsmoker. No wheezing. Patient presents to ER Tmax 101, heart rate into 120s, respirations in the tens to 40s, blood pressures in the 100s to 140s, satting 88% on 3-4 L, transition to BiPAP. Satting mid to upper 80s on BiPAP. White blood cell count 18, creatinine 2.5, potassium 6, INR 3.2, blood sugar 332. ABG with a compensated mild respiratory acidosis. Lactate 2.3-1.2. BNP 238. Dry weight 259 pounds. Weight pending. Chest x-ray with cardiac enlargement with pulmonary vascular congestion, perihilar edema and superimposed consolidation in the right upper lung.  Assessment & Plan   Acute on chronic respiratory failure with hypoxia -Back on 2L (home) -Likely multifactorial, including pneumonia and CHF  Sepsis  secondary to HCAP -Upon admission, patient was septic with leukocytosis and elevated lactic acid (both have resolved) -Initially placed on vanc and cefepime- received 3 doses -Will transition to oral levaquin, spoke with pharmacy, 750mg  dose today, followed by 500mg  dose in 48hrs  Acute exacerbation of Chronic mixed diastolic and systolic CHF -BNP on admission 237 -Echocardiogram 08/2013: EF 40%, moderate diastolic dysfunction -Continue to monitor daily weights, intake and output -Dry weight 256-259 lbs - current wgt 261lbs  -Continue lasix, will decrease dose  -Patient on torsemide at home -Continue Coreg, Enalapril held   Hyperkalemia -Resolved, continue to monitor BMP  Atrial fibrillation -S/P DCCV 07/28/14  -Currently back in Afib, however, rate controlled -Continue coumadin -Possibly secondary to CHF exac vs pneumonia  CAD s/p CABG 2003 -Asymptomatic, no complaints of chest pain   Diabetes mellitus, type 2 -Continue ISS with CBG monitoring  -Hemoglobin A1c 7.2  CKD Stage 3 (GFR 30-59 ml/min) -Baseline crt 2.0-2.25 -Cr 2.23, appears to be stable -Continue to monitor BMP  Morbid obesity - Body mass index is 44.95 kg/(m^2).  -Will need to discuss lifestyle modifications with PCP upon discharge  Hypothyroidism -Continue synthroid  Hyperlipidemia -Continue statin  ?Cold Sore -Patient given valtrex  Code Status: Full  Family Communication:  Husband at bedside.  Disposition Plan: Admitted.  Continue diuresis and monitor.  Likely d/c 5/19.  Time Spent in minutes   30 minutes  Procedures  None  Consults   None  DVT Prophylaxis  Coumadin  Lab Results  Component Value Date   PLT 184 09/29/2014    Medications  Scheduled Meds: . atorvastatin  20 mg Oral Daily  . carvedilol  12.5 mg Oral BID WC  . ceFEPime (MAXIPIME) IV  1 g Intravenous Q24H  . escitalopram  10 mg Oral Daily  . furosemide  80 mg Intravenous 3 times per day  . gabapentin  300 mg Oral BID   . insulin aspart  0-9 Units Subcutaneous TID WC  . insulin NPH Human  20 Units Subcutaneous QHS  . insulin NPH Human  45 Units Subcutaneous Q breakfast  . levothyroxine  25 mcg Oral QAC breakfast  . valACYclovir  2,000 mg Oral Q12H  . vancomycin  1,750 mg Intravenous Q48H  . Warfarin - Pharmacist Dosing Inpatient   Does not apply q1800   Continuous Infusions:  PRN Meds:.albuterol, lip balm, ondansetron (ZOFRAN) IV, phenol, sodium chloride  Antibiotics    Anti-infectives    Start     Dose/Rate Route Frequency Ordered Stop   09/29/14 0200  valACYclovir (VALTREX) tablet 2,000 mg     2,000 mg Oral Every 12 hours 09/29/14 0143 09/30/14 0159   09/28/14 0600  vancomycin (VANCOCIN) 1,750 mg in sodium chloride 0.9 % 500 mL IVPB     1,750 mg 250 mL/hr over 120 Minutes Intravenous Every 48 hours 09/26/14 1034     09/27/14 0600  vancomycin (VANCOCIN) IVPB 750 mg/150 ml premix  Status:  Discontinued     750 mg 150 mL/hr over 60 Minutes Intravenous Every 24 hours 09/26/14 0244 09/26/14 1033   09/27/14 0600  vancomycin (VANCOCIN) 1,750 mg in sodium chloride 0.9 % 500 mL IVPB  Status:  Discontinued     1,750 mg 250 mL/hr over 120 Minutes Intravenous Every 48 hours 09/26/14 1033 09/26/14 1034   09/26/14 2200  ceFEPIme (MAXIPIME) 1 g in dextrose 5 % 50 mL IVPB     1 g 100 mL/hr over 30 Minutes Intravenous Every 24 hours 09/26/14 0244     09/26/14 0245  ceFEPIme (MAXIPIME) 2 g in dextrose 5 % 50 mL IVPB  Status:  Discontinued     2 g 100 mL/hr over 30 Minutes Intravenous  Once 09/26/14 0230 09/26/14 0241   09/26/14 0245  vancomycin (VANCOCIN) IVPB 1000 mg/200 mL premix  Status:  Discontinued     1,000 mg 200 mL/hr over 60 Minutes Intravenous  Once 09/26/14 0230 09/26/14 0241   09/26/14 0130  vancomycin (VANCOCIN) 2,000 mg in sodium chloride 0.9 % 500 mL IVPB     2,000 mg 250 mL/hr over 120 Minutes Intravenous  Once 09/26/14 0123 09/26/14 0451   09/26/14 0130  ceFEPIme (MAXIPIME) 2 g in dextrose  5 % 50 mL IVPB     2 g 100 mL/hr over 30 Minutes Intravenous  Once 09/26/14 0129 09/26/14 0256        Subjective:   Destiny [name] seen and examined today. Patient feels her breathing has improved as compared to admission.  She denies cough.  States she is having bowel movements.  Denies chest pain, dizziness, headache, abdominal pain.    Objective:   Filed Vitals:   09/28/14 1101 09/28/14 1837 09/28/14 2122 09/29/14 0510  BP: 128/72 133/55 131/68 112/44  Pulse: 73 74  86 75  Temp: 98.2 F (36.8 C) 98.5 F (36.9 C) 98.4 F (36.9 C) 98.4 F (36.9 C)  TempSrc: Oral Oral Oral Oral  Resp: 20 18 18 18   Height:      Weight:    118.48 kg (261 lb 3.2 oz)  SpO2: 96% 92% 92% 95%    Wt Readings from Last 3 Encounters:  09/29/14 118.48 kg (261 lb 3.2 oz)  09/09/14 117.391 kg (258 lb 12.8 oz)  09/02/14 120.112 kg (264 lb 12.8 oz)     Intake/Output Summary (Last 24 hours) at 09/29/14 0815 Last data filed at 09/29/14 0600  Gross per 24 hour  Intake   1370 ml  Output    800 ml  Net    570 ml    Exam  General: Well developed, well nourished, NAD, appears stated age  HEENT: NCAT, mucous membranes moist.   Cardiovascular: S1 S2 auscultated, no rubs, murmurs or gallops. Irregular  Respiratory: Slightly diminished in lower lobes, otherwise clear  Abdomen: Soft, obese, nontender, nondistended, + bowel sounds  Extremities: warm dry without cyanosis clubbing. Trace edema in LE B/L  Neuro: AAOx3, nonfocal  Skin: Without rashes exudates or nodules, seborrheic keratosis  Psych: Normal affect and demeanor with intact judgement and insight    Data Review   Micro Results Recent Results (from the past 240 hour(s))  Culture, blood (routine x 2)     Status: None (Preliminary result)   Collection Time: 09/26/14  1:50 AM  Result Value Ref Range Status   Specimen Description BLOOD RIGHT HAND  Final   Special Requests BOTTLES DRAWN AEROBIC AND ANAEROBIC 5CC EACH  Final   Culture    Final           BLOOD CULTURE RECEIVED NO GROWTH TO DATE CULTURE WILL BE HELD FOR 5 DAYS BEFORE ISSUING A FINAL NEGATIVE REPORT Performed at [facility]    Report Status PENDING  Incomplete  Culture, blood (routine x 2)     Status: None (Preliminary result)   Collection Time: 09/26/14  1:56 AM  Result Value Ref Range Status   Specimen Description BLOOD RIGHT FOREARM  Final   Special Requests BOTTLES DRAWN AEROBIC AND ANAEROBIC 4CC EACH  Final   Culture   Final           BLOOD CULTURE RECEIVED NO GROWTH TO DATE CULTURE WILL BE HELD FOR 5 DAYS BEFORE ISSUING A FINAL NEGATIVE REPORT Performed at [facility]    Report Status PENDING  Incomplete  MRSA PCR Screening     Status: None   Collection Time: 09/26/14  8:37 AM  Result Value Ref Range Status   MRSA by PCR NEGATIVE NEGATIVE Final    Comment:        The GeneXpert MRSA Assay (FDA approved for NASAL specimens only), is one component of a comprehensive MRSA colonization surveillance program. It is not intended to diagnose MRSA infection nor to guide or monitor treatment for MRSA infections.     Radiology Reports Dg Chest Port 1 View  09/25/2014   CLINICAL DATA:  Shortness of breath.  EXAM: PORTABLE CHEST - 1 VIEW  COMPARISON:  07/28/2014  FINDINGS: Postoperative changes in the mediastinum. Cardiac pacemaker. Shallow inspiration. Cardiac enlargement with diffuse pulmonary vascular congestion. Perihilar airspace disease suggesting edema. Superimposed consolidation in the right upper lung demonstrates progression since prior study and may represent superimposed atelectasis or pneumonia. No pneumothorax.  IMPRESSION: Cardiac enlargement with pulmonary vascular congestion, perihilar edema, and superimposed  consolidation in the right upper lung.   Electronically Signed   By: Lucienne Capers M.D.   On: 09/25/2014 23:02    CBC  Recent Labs Lab 09/25/14 2159 09/26/14 0333 09/27/14 0220 09/28/14 0241 09/29/14 0338    WBC 18.0* 12.5* 12.5* 10.7* 9.0  HGB 11.6* 10.8* 10.8* 9.7* 9.7*  HCT 37.3 33.2* 33.7* 30.1* 29.6*  PLT 239 211 201 160 184  MCV 91.2 89.7 90.1 89.6 88.1  MCH 28.4 29.2 28.9 28.9 28.9  MCHC 31.1 32.5 32.0 32.2 32.8  RDW 14.4 14.5 14.6 14.4 14.3  LYMPHSABS  --  1.5 1.7 1.9 1.7  MONOABS  --  1.0 1.2* 0.9 1.0  EOSABS  --  0.0 0.0 0.3 0.4  BASOSABS  --  0.0 0.0 0.0 0.0    Chemistries   Recent Labs Lab 09/25/14 2254 09/26/14 0148 09/26/14 0446 09/27/14 0220 09/28/14 0241 09/29/14 0338  NA 133* 134* 135 133* 134* 136  K 6.0* 5.5* 5.6* 4.7 4.4 4.2  CL 98* 100* 102 97* 96* 99*  CO2 21* 24 22 24 28 27   GLUCOSE 395* 332* 301* 271* 177* 104*  BUN 60* 59* 60* 60* 71* 75*  CREATININE 2.49* 2.49* 2.50* 2.50* 2.65* 2.23*  CALCIUM 8.4* 8.6* 8.3* 8.5* 8.5* 8.9  AST 28  --  16 18 16  14*  ALT 16  --  14 15 14 14   ALKPHOS 53  --  49 45 42 44  BILITOT 1.1  --  0.8 0.9 1.0 0.8   ------------------------------------------------------------------------------------------------------------------ estimated creatinine clearance is 28.9 mL/min (by C-G formula based on Cr of 2.23). ------------------------------------------------------------------------------------------------------------------ No results for input(s): HGBA1C in the last 72 hours. ------------------------------------------------------------------------------------------------------------------ No results for input(s): CHOL, HDL, LDLCALC, TRIG, CHOLHDL, LDLDIRECT in the last 72 hours. ------------------------------------------------------------------------------------------------------------------ No results for input(s): TSH, T4TOTAL, T3FREE, THYROIDAB in the last 72 hours.  Invalid input(s): FREET3 ------------------------------------------------------------------------------------------------------------------ No results for input(s): VITAMINB12, FOLATE, FERRITIN, TIBC, IRON, RETICCTPCT in the last 72 hours.  Coagulation  profile  Recent Labs Lab 09/25/14 2159 09/26/14 0446 09/27/14 0220 09/28/14 0241 09/29/14 0338  INR 3.18* 2.95* 2.33* 2.03* 1.92*    No results for input(s): DDIMER in the last 72 hours.  Cardiac Enzymes  Recent Labs Lab 09/26/14 0333 09/26/14 1030 09/26/14 1418  TROPONINI 0.03 0.04* 0.05*   ------------------------------------------------------------------------------------------------------------------ Invalid input(s): POCBNP    Destiny [name] D.O. on 09/29/2014 at 8:15 AM  Between 7am to 7pm - Pager - [phone]  After 7pm go to [email or web address] - password [email or web address]  And look for the night coverage person covering for me after hours  [facility] Office  [phone]

## 2014-09-29 NOTE — Care Management Note (Signed)
Case Management Note  Patient Details  Name: Destiny [name] MRN: [id number] Date of Birth: [birth year]  Subjective/Objective:       PT recommending no OP follow up at dc.              Action/Plan: Pt states she is fairly independent at home with husband.  Home oxygen provided by [employer].  Pt denies any home needs presently; will cont to follow progress.     Expected Discharge Date:                  Expected Discharge Plan:  [place]  In-House Referral:     Discharge planning Services  CM Consult  Post Acute Care Choice:    Choice offered to:     DME Arranged:    DME Agency:     HH Arranged:    [place] Agency:     Status of Service:  In process, will continue to follow  Medicare Important Message Given:  Yes Date Medicare IM Given:  09/29/14 Medicare IM give by:  Ellan Lambert, RN, BSN  Date Additional Medicare IM Given:    Additional Medicare Important Message give by:     If discussed at [place] of Stay Meetings, dates discussed:    Additional Comments:  Ella Bodo, RN 09/29/2014, 2:52 PM Phone [phone]

## 2014-09-29 NOTE — Evaluation (Signed)
Physical Therapy Evaluation Patient Details Name: Destiny [name] MRN: [id number] DOB: [birth year] Today's Date: 09/29/2014   History of Present Illness  This is a 74 y.o. year old female with significant past medical history of morbid obesity, IDDM, paroxysmal atrial fibrillation on coumadin, chronic resp failure on 2, chronic combined systolic and diastolic CHF, CAD s/p CABG, stage 3 CKD presenting with sepsis, acute on chronic resp failure, HCAP, hyperkalemia, acute on chronic mixed CHF.  Clinical Impression  Pt admitted with above diagnosis. Pt currently with functional limitations due to the deficits listed below (see PT Problem List).  Pt will benefit from skilled PT to increase their independence and safety with mobility to allow discharge to the venue listed below.      Follow Up Recommendations No PT follow up    Equipment Recommendations  None recommended by PT    Recommendations for Other Services       Precautions / Restrictions Precautions Precautions: Fall Precaution Comments: watch O2 sats Restrictions Weight Bearing Restrictions: No      Mobility  Bed Mobility               General bed mobility comments: pt received in chair  Transfers Overall transfer level: Needs assistance Equipment used: Rolling walker (2 wheeled) Transfers: Sit to/from Stand Sit to Stand: Min guard         General transfer comment: min-guard A but pt did not require physical help, did require increased time and maximal effort to rise from recliner. She reports that her chair at home is raised up for this reason  Ambulation/Gait Ambulation/Gait assistance: Min guard Ambulation Distance (Feet): 70 Feet Assistive device: Rolling walker (2 wheeled) Gait Pattern/deviations: Trunk flexed;Step-through pattern Gait velocity: decreased Gait velocity interpretation: Below normal speed for age/gender General Gait Details: 2 standing rest breaks to catch breath, ambulated on 3L O2,  increased to 4L last 15'. HR up to 125 with initiation of ambulation, O2 sats 87%. Returned to 91% on 2L at wall after ambulation. Decreased pace due to fatigue.   Stairs            Wheelchair Mobility    Modified Rankin (Stroke Patients Only)       Balance Overall balance assessment: Needs assistance Sitting-balance support: No upper extremity supported;Feet supported Sitting balance-Leahy Scale: Good     Standing balance support: No upper extremity supported Standing balance-Leahy Scale: Fair Standing balance comment: Pt able to maintain balance without UE support in standing, though when she fatigues, legs get weak and she requires UE support, so balance inconsistent                             Pertinent Vitals/Pain Pain Assessment: No/denies pain    Home Living Family/patient expects to be discharged to:: Private residence Living Arrangements: Spouse/significant other Available Help at Discharge: Family;Available 24 hours/day Type of Home: House Home Access: Ramped entrance     Home Layout: One level Home Equipment: Walker - 4 wheels;Grab bars - tub/shower;Hand held shower head;Shower seat;Electric scooter      Prior Function Level of Independence: Independent with assistive device(s)         Comments: if pt needs help, husband assists her     Hand Dominance        Extremity/Trunk Assessment   Upper Extremity Assessment: Defer to OT evaluation           Lower Extremity Assessment: Generalized weakness  Cervical / Trunk Assessment: Kyphotic  Communication   Communication: No difficulties  Cognition Arousal/Alertness: Awake/alert Behavior During Therapy: WFL for tasks assessed/performed Overall Cognitive Status: Within Functional Limits for tasks assessed                      General Comments General comments (skin integrity, edema, etc.): O2 sats began at 90% on 2L O2 before session    Exercises General  Exercises - Lower Extremity Mini-Sqauts: Strengthening;10 reps;Standing      Assessment/Plan    PT Assessment Patient needs continued PT services  PT Diagnosis Difficulty walking;Generalized weakness   PT Problem List Decreased activity tolerance;Decreased strength;Decreased balance;Decreased mobility;Cardiopulmonary status limiting activity  PT Treatment Interventions DME instruction;Gait training;Functional mobility training;Therapeutic activities;Therapeutic exercise;Patient/family education   PT Goals (Current goals can be found in the Care Plan section) Acute Rehab PT Goals Patient Stated Goal: return home PT Goal Formulation: With patient Time For Goal Achievement: 10/13/14 Potential to Achieve Goals: Good    Frequency Min 3X/week   Barriers to discharge        Co-evaluation               End of Session Equipment Utilized During Treatment: Gait belt;Oxygen Activity Tolerance: Patient tolerated treatment well Patient left: in chair;with call bell/phone within reach;with chair alarm set Nurse Communication: Mobility status         Time: 0321-2248 PT Time Calculation (min) (ACUTE ONLY): 21 min   Charges:   PT Evaluation $Initial PT Evaluation Tier I: 1 Procedure     PT G Codes:      Leighton Roach, PT  Acute Rehab Services  [place], [place] 09/29/2014, 9:47 AM

## 2014-09-29 NOTE — Progress Notes (Signed)
ANTICOAGULATION CONSULT NOTE - Follow Up Consult  Pharmacy Consult for warfarin Indication: atrial fibrillation  Allergies  Allergen Reactions  . Sulfonamide Derivatives Other (See Comments)    Unknown; "childhood allergy/mother"    Patient Measurements: Height: 5\' 4"  (162.6 cm) Weight: 261 lb 3.2 oz (118.48 kg) (a scale) IBW/kg (Calculated) : 54.7  Vital Signs: Temp: 98.4 F (36.9 C) (05/18 0510) Temp Source: Oral (05/18 0510) BP: 112/44 mmHg (05/18 0510) Pulse Rate: 75 (05/18 0510)  Labs:  Recent Labs  09/26/14 1418  09/27/14 0220 09/28/14 0241 09/29/14 0338  HGB  --   < > 10.8* 9.7* 9.7*  HCT  --   --  33.7* 30.1* 29.6*  PLT  --   --  201 160 184  LABPROT  --   --  25.7* 23.1* 22.2*  INR  --   --  2.33* 2.03* 1.92*  CREATININE  --   --  2.50* 2.65* 2.23*  TROPONINI 0.05*  --   --   --   --   < > = values in this interval not displayed.  Estimated Creatinine Clearance: 28.9 mL/min (by C-G formula based on Cr of 2.23).  Assessment: 74 yo m admitted on 5/15 for AoC respiratory failure, hyperkalemia, and AoCHF. Patient continues on chronic coumadin for afib (PTA dose 7.5 mg daily except 5 mg on Monday and Friday). INR is slightly low today. Will not plan to increase dose though since levaquin is starting today which may increase the INR.   Goal of Therapy:  INR 2-3 Monitor platelets by anticoagulation protocol: Yes   Plan:  - Warfarin 7.5mg  PO x 1 tonight - F/u AM INR  Salome Arnt, PharmD, BCPS Pager # [phone] 09/29/2014 12:30 PM

## 2014-09-29 NOTE — Progress Notes (Signed)
Hypoglycemic Event  CBG: 63 at 0702  Treatment: 15 GM carbohydrate snack  Symptoms: None  Follow-up CBG: [id number] CBG Result:90  Possible Reasons for Event: Inadequate meal intake  Comments/MD notified:Text page hospitalist after giving report to day nurse. Pt states she did not eat well for any of her meals yesterday and that may have been the cause. Pt had a night time snack of graham crackers and peanut butter with diet ginger ale. Also pt received 20 units of NPH per order. Pt was asymptomatic when blood sugar low. Reported off to day shift of this event. Nurse signing off at this time.    Jag Lenz  Remember to initiate Hypoglycemia Order Set & complete

## 2014-09-29 NOTE — Progress Notes (Signed)
Pt has personal unit/nasal pillows from home.

## 2014-09-29 NOTE — Progress Notes (Signed)
Pt and husband stated that they will take care of putting CPAP on patient when she is ready. They understand to contact RN/RT if the need help. RT will monitor.

## 2014-09-30 ENCOUNTER — Telehealth: Payer: Self-pay | Admitting: *Deleted

## 2014-09-30 DIAGNOSIS — E785 Hyperlipidemia, unspecified: Secondary | ICD-10-CM

## 2014-09-30 DIAGNOSIS — E039 Hypothyroidism, unspecified: Secondary | ICD-10-CM

## 2014-09-30 LAB — CBC WITH DIFFERENTIAL/PLATELET
Basophils Absolute: 0 10*3/uL (ref 0.0–0.1)
Basophils Relative: 0 % (ref 0–1)
Eosinophils Absolute: 0.3 10*3/uL (ref 0.0–0.7)
Eosinophils Relative: 4 % (ref 0–5)
HCT: 28.1 % — ABNORMAL LOW (ref 36.0–46.0)
Hemoglobin: 9.5 g/dL — ABNORMAL LOW (ref 12.0–15.0)
Lymphocytes Relative: 19 % (ref 12–46)
Lymphs Abs: 1.4 10*3/uL (ref 0.7–4.0)
MCH: 29.6 pg (ref 26.0–34.0)
MCHC: 33.8 g/dL (ref 30.0–36.0)
MCV: 87.5 fL (ref 78.0–100.0)
MONOS PCT: 13 % — AB (ref 3–12)
Monocytes Absolute: 1 10*3/uL (ref 0.1–1.0)
Neutro Abs: 4.9 10*3/uL (ref 1.7–7.7)
Neutrophils Relative %: 64 % (ref 43–77)
PLATELETS: 169 10*3/uL (ref 150–400)
RBC: 3.21 MIL/uL — ABNORMAL LOW (ref 3.87–5.11)
RDW: 14.2 % (ref 11.5–15.5)
WBC: 7.5 10*3/uL (ref 4.0–10.5)

## 2014-09-30 LAB — PROTIME-INR
INR: 2.14 — ABNORMAL HIGH (ref 0.00–1.49)
PROTHROMBIN TIME: 24.1 s — AB (ref 11.6–15.2)

## 2014-09-30 LAB — COMPREHENSIVE METABOLIC PANEL
ALT: 12 U/L — ABNORMAL LOW (ref 14–54)
AST: 13 U/L — AB (ref 15–41)
Albumin: 2.7 g/dL — ABNORMAL LOW (ref 3.5–5.0)
Alkaline Phosphatase: 45 U/L (ref 38–126)
Anion gap: 10 (ref 5–15)
BILIRUBIN TOTAL: 0.6 mg/dL (ref 0.3–1.2)
BUN: 73 mg/dL — AB (ref 6–20)
CALCIUM: 8.7 mg/dL — AB (ref 8.9–10.3)
CO2: 28 mmol/L (ref 22–32)
Chloride: 97 mmol/L — ABNORMAL LOW (ref 101–111)
Creatinine, Ser: 2.18 mg/dL — ABNORMAL HIGH (ref 0.44–1.00)
GFR, EST AFRICAN AMERICAN: 25 mL/min — AB (ref >60)
GFR, EST NON AFRICAN AMERICAN: 21 mL/min — AB (ref >60)
GLUCOSE: 186 mg/dL — AB (ref 65–99)
Potassium: 4.3 mmol/L (ref 3.5–5.1)
Sodium: 135 mmol/L (ref 135–145)
Total Protein: 5.8 g/dL — ABNORMAL LOW (ref 6.5–8.1)

## 2014-09-30 LAB — GLUCOSE, CAPILLARY: Glucose-Capillary: 171 mg/dL — ABNORMAL HIGH (ref 65–99)

## 2014-09-30 MED ORDER — WARFARIN SODIUM 7.5 MG PO TABS
7.5000 mg | ORAL_TABLET | Freq: Once | ORAL | Status: DC
  Filled 2014-09-30: qty 1

## 2014-09-30 MED ORDER — LEVOFLOXACIN 500 MG PO TABS: 500.0000 mg | ORAL_TABLET | Freq: Every day | ORAL | Status: DC

## 2014-09-30 MED ORDER — CARVEDILOL 12.5 MG PO TABS: 12.5000 mg | ORAL_TABLET | Freq: Two times a day (BID) | ORAL | Status: DC

## 2014-09-30 NOTE — Telephone Encounter (Signed)
Transition Care Management Follow-up Telephone Call  How have you been since you were released from the hospital? "I'm weak but feeling a whole lot better"    Do you understand why you were in the hospital? YES- I got to where I couldn't breathe, I called an ambulance and went to the hospital with pneumonia"   Do you understand the discharge instrcutions? YES-   Items Reviewed:  Medications reviewed:  YES   Allergies reviewed:  YES   Dietary changes reviewed:  YES   Referrals reviewed:  YES    Functional Questionnaire:   Activities of Daily Living (ADLs):   She states they are independent in the following: bathroom, dressing, bathing States they require assistance with the following: medications, driving, cooking- husband is helping with these    Any transportation issues/concerns?: NO   Any patient concerns? NO   Confirmed importance and date/time of follow-up visits scheduled: YES- scheduled with Mackie Pai, PA 10/05/14.   Confirmed with patient if condition begins to worsen call PCP or go to the ER.  Patient was given the Call-a-Nurse line [phone]: YES

## 2014-09-30 NOTE — Progress Notes (Signed)
[facility] for warfarin Indication: atrial fibrillation  Allergies  Allergen Reactions  . Sulfonamide Derivatives Other (See Comments)    Unknown; "childhood allergy/mother"    Patient Measurements: Height: 5\' 4"  (162.6 cm) Weight: 261 lb 6.4 oz (118.57 kg) IBW/kg (Calculated) : 54.7  Vital Signs: Temp: 97.8 F (36.6 C) (05/19 0555) Temp Source: Oral (05/19 0555) BP: 114/55 mmHg (05/19 0555) Pulse Rate: 72 (05/19 0555)  Labs:  Recent Labs  09/28/14 0241 09/29/14 0338 09/30/14 0455  HGB 9.7* 9.7* 9.5*  HCT 30.1* 29.6* 28.1*  PLT 160 184 169  LABPROT 23.1* 22.2* 24.1*  INR 2.03* 1.92* 2.14*  CREATININE 2.65* 2.23* 2.18*    Estimated Creatinine Clearance: 29.6 mL/min (by C-G formula based on Cr of 2.18).  Assessment: 74 yo m admitted on 5/15 for AoC respiratory failure, hyperkalemia, and AoCHF. Patient continues on chronic coumadin for afib (PTA dose 7.5 mg daily except 5 mg on Monday and Friday). INR 2.19, started Levaquin yesterday, may effect INR.  Goal of Therapy:  INR 2-3 Monitor platelets by anticoagulation protocol: Yes   Plan:  - Warfarin 7.5 mg po x1 - F/u AM INR    Hughes Better, PharmD, BCPS Clinical Pharmacist Pager: [phone] 09/30/2014 10:18 AM

## 2014-09-30 NOTE — Discharge Summary (Signed)
Physician Discharge Summary  Destiny [name] MPN:[id number] DOB: 28-Jun-1940 DOA: 09/25/2014  PCP: Penni Homans, MD  Admit date: 09/25/2014 Discharge date: 09/30/2014  Time spent: 45 minutes  Recommendations for Outpatient Follow-up:  Patient will be discharged to home.  Patient will need to follow up with primary care provider within one week of discharge as well as cardiology, Dr. Lovena Le.  Patient should continue medications as prescribed.  Patient should follow a Heart healthy/carb modified diet, 1227ml fluid restriction per day.   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  Filed Weights   09/28/14 0644 09/29/14 0510 09/30/14 0555  Weight: 118.842 kg (262 lb) 118.48 kg (261 lb 3.2 oz) 118.57 kg (261 lb 6.4 oz)    History of present illness:  on 09/26/2014 by Dr. Shanda Howells This is a 74 y.o. year old female with significant past medical history of morbid obesity, IDDM, paroxysmal atrial fibrillation on coumadin, chronic resp failure on 2, chronic combined systolic and diastolic CHF, CAD s/p CABG, stage 3 CKD presenting with sepsis, acute on chronic resp failure, HCAP, hyperkalemia, acute on chronic mixed CHF. Husband and patient report patient with progressive weakness and shortness of breath over the past 2 days. Symptoms seemed to progressively worsen primarily over the past 24 hours. Reports increased work of breathing. No fevers or chills. Has no some mild increase in baseline weight her husband and patient. Attended take increased doses of torsemide  at home however, this was ineffective and denies any increased salt intake nonsmoker. No wheezing. Patient presents to ER Tmax 101, heart rate into 120s, respirations in the tens to 40s, blood pressures in the 100s to 140s, satting 88% on 3-4 L, transition to BiPAP. Satting mid to upper 80s on BiPAP. White blood cell count 18, creatinine 2.5, potassium 6, INR 3.2, blood sugar 332. ABG with a compensated mild respiratory acidosis. Lactate 2.3-1.2. BNP 238. Dry weight 259 pounds. Weight pending. Chest x-ray with cardiac enlargement with pulmonary vascular congestion, perihilar edema and superimposed consolidation in the right upper lung.  Hospital Course:  Acute on chronic respiratory failure with hypoxia -Back on 2L (home) -Likely multifactorial, including pneumonia and CHF  Sepsis secondary to HCAP -Upon admission, patient was septic with leukocytosis and elevated lactic acid (both have resolved) -Initially placed on vanc and cefepime- received 3 doses -Will transition to oral levaquin, spoke with pharmacy, continue on more day  Acute exacerbation of Chronic mixed diastolic and systolic CHF -BNP on admission 237 -Echocardiogram 08/2013: EF 40%, moderate diastolic dysfunction -Continue to monitor daily weights, intake and output -Dry weight 256-259 lbs - current wgt 261lbs  -Continue lasix, will decrease dose  -Patient on torsemide at home- continue at discharge -Continue Coreg, Enalapril held due to AKI, but may continue upon discharge  Hyperkalemia -Resolved, continue to monitor BMP  Atrial fibrillation -S/P DCCV 07/28/14  -Currently back in Afib, however, rate controlled -Continue coumadin, INR 2.14 -Possibly secondary to CHF exac vs pneumonia -Patient should follow up with cardiology at discharge.  CAD s/p CABG 2003 -Asymptomatic, no complaints of chest pain   Diabetes mellitus, type 2 -Continue ISS with CBG monitoring  -Hemoglobin A1c 7.2  CKD Stage 3 (GFR  30-59  ml/min) -Baseline crt 2.0-2.25 -Cr 2.18, appears to be stable  Morbid obesity - Body mass index is 44.95 kg/(m^2).  -Will need to discuss lifestyle modifications with PCP upon discharge  Hypothyroidism -Continue synthroid  Hyperlipidemia -Continue statin  ?Cold Sore -Patient given valtrex  Procedures  None  Consults  None  Discharge Exam: Filed Vitals:   09/30/14 0555  BP: 114/55  Pulse: 72  Temp: 97.8 F (36.6 C)  Resp: 20   Exam  General: Well developed, well nourished, no distress  Cardiovascular: S1 S2 auscultated, no murmurs,  Irregular  Respiratory: Clear to auscultation  Abdomen: Soft, obese, nontender, nondistended, + bowel sounds  Extremities: warm dry without cyanosis clubbing, or edema in lower extremities  Neuro: AAOx3, nonfocal  Psych: Normal affect and demeanor  Discharge Instructions      Discharge Instructions    Discharge instructions    Complete by:  As directed   Patient will be discharged to home.  Patient will need to follow up with primary care provider within one week of discharge as well as cardiology, Dr. Lovena Le.  Patient should continue medications as prescribed.  Patient should follow a Heart healthy/carb modified diet, 1233ml fluid restriction per day.            Medication List    STOP taking these medications        amLODipine 5 MG tablet  Commonly known as:  NORVASC      TAKE these medications        acetaminophen 500 MG tablet  Commonly known as:  TYLENOL  Take 1,000 mg by mouth every 6 (six) hours as needed (pain).     albuterol 108 (90 BASE) MCG/ACT inhaler  Commonly known as:  PROVENTIL HFA;VENTOLIN HFA  Inhale 2 puffs into the lungs every 6 (six) hours as needed for wheezing or shortness of breath.     atorvastatin 20 MG tablet  Commonly known as:  LIPITOR  Take 1 tablet (20 mg total) by mouth daily.     carvedilol 12.5 MG tablet  Commonly known as:  COREG  Take 1 tablet (12.5 mg total) by mouth  2 (two) times daily with a meal.     [employer] FISH OIL 1000 MG Caps  Take 2 capsules by mouth daily.     enalapril 2.5 MG tablet  Commonly known as:  VASOTEC  TAKE 1 TABLET TWICE DAILY.     escitalopram 10 MG tablet  Commonly known as:  LEXAPRO  Take 1 tablet (10 mg total) by mouth daily.     fenofibrate 48 MG tablet  Commonly known as:  TRICOR  Take 1 tablet (48 mg total) by mouth daily.     fluticasone 50 MCG/ACT nasal spray  Commonly known as:  FLONASE  Place 2 sprays into both nostrils daily as needed for allergies or rhinitis. As needed for nasal stuffiness.     gabapentin 300 MG capsule  Commonly known as:  NEURONTIN  Take 1 capsule (300 mg total) by mouth 2 (two) times daily.     glucose blood test strip  Commonly known as:  FREESTYLE LITE  - DX: 250.60  -   - Check sugars bid and as needed     insulin NPH Human 100 UNIT/ML injection  Commonly known as:  HUMULIN N,NOVOLIN N  Inject 20-45 Units into the skin 2 (two) times daily before a meal. Take 45 units in the morning and 20 units in the evening  insulin regular 100 units/mL injection  Commonly known as:  NOVOLIN R,HUMULIN R  Change to 15 units three times a with meals (to cover all meals--this is the same total amount but spread out)     levofloxacin 500 MG tablet  Commonly known as:  LEVAQUIN  Take 1 tablet (500 mg total) by mouth daily. Continuation of hospital course.  Start taking on:  10/01/2014     levothyroxine 25 MCG tablet  Commonly known as:  SYNTHROID, LEVOTHROID  TAKE 1 TABLET EVERY DAY BEFORE BREAKFAST     LORazepam 0.5 MG tablet  Commonly known as:  ATIVAN  Take 1 tablet (0.5 mg total) by mouth at bedtime as needed for anxiety.     NON FORMULARY  Oxygen---24 hour     torsemide 20 MG tablet  Commonly known as:  DEMADEX  Take 2 tablets (40 mg total) by mouth once. may take an extra tab for 3# weight gain, swelling or SOB     Vitamin D (Ergocalciferol) 50000 UNITS Caps capsule  Commonly  known as:  DRISDOL  Take 50,000 Units by mouth every 7 (seven) days. Tuesdays     warfarin 5 MG tablet  Commonly known as:  COUMADIN  Take 7.5 mg by mouth daily at 6 PM. Monday and Friday takes 5mg     And all other days takes 7.5mg        Allergies  Allergen Reactions  . Sulfonamide Derivatives Other (See Comments)    Unknown; "childhood allergy/mother"   Follow-up Information    Follow up with Penni Homans, MD. Schedule an appointment as soon as possible for a visit in 1 week.   Specialty:  Family Medicine   Why:  Hospital follow up   Contact information:   [place] [address] [phone]       Follow up with Cristopher Peru, MD. Schedule an appointment as soon as possible for a visit in 2 weeks.   Specialty:  Cardiology   Why:  Hospital followup   Contact information:   [address] [place] [address] [phone]        The results of significant diagnostics from this hospitalization (including imaging, microbiology, ancillary and laboratory) are listed below for reference.    Significant Diagnostic Studies: Dg Chest Port 1 View  09/25/2014   CLINICAL DATA:  Shortness of breath.  EXAM: PORTABLE CHEST - 1 VIEW  COMPARISON:  07/28/2014  FINDINGS: Postoperative changes in the mediastinum. Cardiac pacemaker. Shallow inspiration. Cardiac enlargement with diffuse pulmonary vascular congestion. Perihilar airspace disease suggesting edema. Superimposed consolidation in the right upper lung demonstrates progression since prior study and may represent superimposed atelectasis or pneumonia. No pneumothorax.  IMPRESSION: Cardiac enlargement with pulmonary vascular congestion, perihilar edema, and superimposed consolidation in the right upper lung.   Electronically Signed   By: Lucienne Capers M.D.   On: 09/25/2014 23:02    Microbiology: Recent Results (from the past 240 hour(s))  Culture, blood (routine x 2)     Status: None  (Preliminary result)   Collection Time: 09/26/14  1:50 AM  Result Value Ref Range Status   Specimen Description BLOOD RIGHT HAND  Final   Special Requests BOTTLES DRAWN AEROBIC AND ANAEROBIC 5CC EACH  Final   Culture   Final           BLOOD CULTURE RECEIVED NO GROWTH TO DATE CULTURE WILL BE HELD FOR 5 DAYS BEFORE ISSUING A FINAL NEGATIVE REPORT Performed at  [facility]    Report Status PENDING  Incomplete  Culture, blood (routine x 2)     Status: None (Preliminary result)   Collection Time: 09/26/14  1:56 AM  Result Value Ref Range Status   Specimen Description BLOOD RIGHT FOREARM  Final   Special Requests BOTTLES DRAWN AEROBIC AND ANAEROBIC 4CC EACH  Final   Culture   Final           BLOOD CULTURE RECEIVED NO GROWTH TO DATE CULTURE WILL BE HELD FOR 5 DAYS BEFORE ISSUING A FINAL NEGATIVE REPORT Performed at [facility]    Report Status PENDING  Incomplete  MRSA PCR Screening     Status: None   Collection Time: 09/26/14  8:37 AM  Result Value Ref Range Status   MRSA by PCR NEGATIVE NEGATIVE Final    Comment:        The GeneXpert MRSA Assay (FDA approved for NASAL specimens only), is one component of a comprehensive MRSA colonization surveillance program. It is not intended to diagnose MRSA infection nor to guide or monitor treatment for MRSA infections.      Labs: Basic Metabolic Panel:  Recent Labs Lab 09/26/14 0446 09/27/14 0220 09/28/14 0241 09/29/14 0338 09/30/14 0455  NA 135 133* 134* 136 135  K 5.6* 4.7 4.4 4.2 4.3  CL 102 97* 96* 99* 97*  CO2 22 24 28 27 28   GLUCOSE 301* 271* 177* 104* 186*  BUN 60* 60* 71* 75* 73*  CREATININE 2.50* 2.50* 2.65* 2.23* 2.18*  CALCIUM 8.3* 8.5* 8.5* 8.9 8.7*   Liver Function Tests:  Recent Labs Lab 09/26/14 0446 09/27/14 0220 09/28/14 0241 09/29/14 0338 09/30/14 0455  AST 16 18 16  14* 13*  ALT 14 15 14 14  12*  ALKPHOS 49 45 42 44 45  BILITOT 0.8 0.9 1.0 0.8 0.6  PROT 6.2* 6.5 6.1* 6.4* 5.8*    ALBUMIN 3.2* 3.4* 2.9* 3.0* 2.7*   No results for input(s): LIPASE, AMYLASE in the last 168 hours. No results for input(s): AMMONIA in the last 168 hours. CBC:  Recent Labs Lab 09/26/14 0333 09/27/14 0220 09/28/14 0241 09/29/14 0338 09/30/14 0455  WBC 12.5* 12.5* 10.7* 9.0 7.5  NEUTROABS 10.0* 9.5* 7.5 5.9 4.9  HGB 10.8* 10.8* 9.7* 9.7* 9.5*  HCT 33.2* 33.7* 30.1* 29.6* 28.1*  MCV 89.7 90.1 89.6 88.1 87.5  PLT 211 201 160 184 169   Cardiac Enzymes:  Recent Labs Lab 09/26/14 0333 09/26/14 1030 09/26/14 1418  TROPONINI 0.03 0.04* 0.05*   BNP: BNP (last 3 results)  Recent Labs  07/28/14 1000 07/28/14 2048 09/25/14 2159  BNP 171.5* 353.9* 237.5*    ProBNP (last 3 results) No results for input(s): PROBNP in the last 8760 hours.  CBG:  Recent Labs Lab 09/29/14 0739 09/29/14 1149 09/29/14 1649 09/29/14 2125 09/30/14 0546  GLUCAP 90 261* 271* 304* 171*       Signed:  Albertus Chiarelli  [facility] 09/30/2014, 9:58 AM

## 2014-09-30 NOTE — Progress Notes (Signed)
Pt is being discharged home. Pt has been provided with her discharge instructions. RN answered all questions the patient had. Pt is being transported home by her husband who is at the bedside.

## 2014-10-02 LAB — CULTURE, BLOOD (ROUTINE X 2)
CULTURE: NO GROWTH
CULTURE: NO GROWTH

## 2014-10-05 ENCOUNTER — Ambulatory Visit (INDEPENDENT_AMBULATORY_CARE_PROVIDER_SITE_OTHER): Payer: Medicare Other | Admitting: Medical

## 2014-10-05 ENCOUNTER — Ambulatory Visit (HOSPITAL_BASED_OUTPATIENT_CLINIC_OR_DEPARTMENT_OTHER)
Admission: RE | Admit: 2014-10-05 | Discharge: 2014-10-05 | Disposition: A | Discharge status: Home / Self Care | Payer: Medicare Other | Source: Ambulatory Visit | Attending: Medical | Admitting: Medical

## 2014-10-05 ENCOUNTER — Encounter: Payer: Self-pay | Admitting: Medical

## 2014-10-05 VITALS — BP 110/73 | HR 72 | Temp 97.9°F | Wt 261.2 lb

## 2014-10-05 DIAGNOSIS — I509 Heart failure, unspecified: Secondary | ICD-10-CM | POA: Diagnosis not present

## 2014-10-05 DIAGNOSIS — E875 Hyperkalemia: Secondary | ICD-10-CM | POA: Diagnosis not present

## 2014-10-05 DIAGNOSIS — J189 Pneumonia, unspecified organism: Secondary | ICD-10-CM

## 2014-10-05 DIAGNOSIS — J811 Chronic pulmonary edema: Secondary | ICD-10-CM | POA: Insufficient documentation

## 2014-10-05 DIAGNOSIS — J9621 Acute and chronic respiratory failure with hypoxia: Secondary | ICD-10-CM

## 2014-10-05 DIAGNOSIS — I5043 Acute on chronic combined systolic (congestive) and diastolic (congestive) heart failure: Secondary | ICD-10-CM | POA: Diagnosis not present

## 2014-10-05 DIAGNOSIS — Z951 Presence of aortocoronary bypass graft: Secondary | ICD-10-CM | POA: Diagnosis present

## 2014-10-05 LAB — CBC WITH DIFFERENTIAL/PLATELET
Basophils Absolute: 0 10*3/uL (ref 0.0–0.1)
Basophils Relative: 0.3 % (ref 0.0–3.0)
EOS ABS: 0.1 10*3/uL (ref 0.0–0.7)
Eosinophils Relative: 2.1 % (ref 0.0–5.0)
HCT: 31 % — ABNORMAL LOW (ref 36.0–46.0)
HEMOGLOBIN: 10.5 g/dL — AB (ref 12.0–15.0)
Lymphocytes Relative: 19.4 % (ref 12.0–46.0)
Lymphs Abs: 1.3 10*3/uL (ref 0.7–4.0)
MCHC: 33.9 g/dL (ref 30.0–36.0)
MCV: 86.6 fl (ref 78.0–100.0)
MONOS PCT: 10 % (ref 3.0–12.0)
Monocytes Absolute: 0.7 10*3/uL (ref 0.1–1.0)
NEUTROS ABS: 4.6 10*3/uL (ref 1.4–7.7)
Neutrophils Relative %: 68.2 % (ref 43.0–77.0)
PLATELETS: 216 10*3/uL (ref 150.0–400.0)
RBC: 3.57 Mil/uL — ABNORMAL LOW (ref 3.87–5.11)
RDW: 14.8 % (ref 11.5–15.5)
WBC: 6.8 10*3/uL (ref 4.0–10.5)

## 2014-10-05 LAB — COMPREHENSIVE METABOLIC PANEL
ALBUMIN: 3.7 g/dL (ref 3.5–5.2)
ALT: 11 U/L (ref 0–35)
AST: 13 U/L (ref 0–37)
Alkaline Phosphatase: 48 U/L (ref 39–117)
BUN: 72 mg/dL — AB (ref 6–23)
CALCIUM: 9.5 mg/dL (ref 8.4–10.5)
CHLORIDE: 96 meq/L (ref 96–112)
CO2: 33 mEq/L — ABNORMAL HIGH (ref 19–32)
CREATININE: 2.53 mg/dL — AB (ref 0.40–1.20)
GFR: 19.84 mL/min — AB (ref >60.00)
GLUCOSE: 190 mg/dL — AB (ref 70–99)
Potassium: 4.7 mEq/L (ref 3.5–5.1)
Sodium: 135 mEq/L (ref 135–145)
Total Bilirubin: 0.4 mg/dL (ref 0.2–1.2)
Total Protein: 6.7 g/dL (ref 6.0–8.3)

## 2014-10-05 NOTE — Progress Notes (Signed)
Pre visit review using our clinic review tool, if applicable. No additional management support is needed unless otherwise documented below in the visit note. 

## 2014-10-05 NOTE — Assessment & Plan Note (Signed)
Clinically stable and on continuous 02. Getting repeat cxr.

## 2014-10-05 NOTE — Assessment & Plan Note (Signed)
Stable currently post hospitalization. Will see Dr. Lovena Le in one week.

## 2014-10-05 NOTE — Assessment & Plan Note (Signed)
Will recheck cmp today. She had hyperkalemia on admission per her report/summary. Hx of in past so will get lab today.

## 2014-10-05 NOTE — Assessment & Plan Note (Signed)
Pt in for follow up from hospital. She was admitted for pneumonia(CAP), sepis, resp failure, chronic chf, hyperkalemia, and atrial fibrillation.  Clinically doing well. Lungs sound clear. 02 sat 93% but that is not new for her.   Will get repeat cxr today. Did not see repeat cxr on review. One I saw was on 09-25-2014. She will stay on her prior chronic meds. Pt currenlty finished with oral antibiotic given on dc.  Will go ahead and get cbc as well today.  Will defer bnp to cardiologist who will see her in a week.

## 2014-10-05 NOTE — Patient Instructions (Addendum)
CAP (community acquired pneumonia) Pt in for follow up from hospital. She was admitted for pneumonia(CAP), sepis, resp failure, chronic chf, hyperkalemia, and atrial fibrillation.  Clinically doing well. Lungs sound clear. 02 sat 93% but that is not new for her.   Will get repeat cxr today. Did not see repeat cxr on review. One I saw was on 09-25-2014. She will stay on her prior chronic meds. Pt currenlty finished with oral antibiotic given on dc.  Will go ahead and get cbc as well today.  Will defer bnp to cardiologist who will see her in a week.   Hyperkalemia Will recheck cmp today. She had hyperkalemia on admission per her report/summary. Hx of in past so will get lab today.   Acute on chronic combined systolic and diastolic heart failure Stable currently post hospitalization. Will see Dr. Lovena Le in one week.   Acute on chronic respiratory failure with hypoxia Clinically stable and on continuous 02. Getting repeat cxr.     Follow up in with cardio in one week. Then follow with pcp as regulary scheduled or as needed.

## 2014-10-05 NOTE — Progress Notes (Signed)
Subjective:    [name], female    DOB: 08-Mar-1941, 74 y.o.   MRN: [id number]  HPI  Pt in for follow up from hospital. She was admitted for pneumonia(CAP), sepis, resp failure, chronic chf, hyperkalemia, and atrial fibrillation.  Pt wad dc on 09-30-2014. Pt should see Dr. Lovena Le cardiology.  Pt was treated with antibiotics. Appears discharge with levofloxin. Pt stats since discharge gradually getting better each day. Pt appetite improving. Pt has been on o2 since 2014.   Pt will see Dr. Lovena Le next week. Also wil see Dr. Algernon Huxley for chf.  No fevers or chills, sweats or cough.  Pt k level on discharge was normal. Pt sees nephrologist every 4 months. Approximate 2 months from now.  Sep 25 2014. CXR showed some chf and some rul consolidation.    Review of Systems  Constitutional: Negative for fever, chills, diaphoresis, activity change and fatigue.  Respiratory: Negative for cough, chest tightness, shortness of breath and wheezing.   Cardiovascular: Negative for chest pain, palpitations and leg swelling.  Gastrointestinal: Negative for nausea, vomiting, abdominal pain and anal bleeding.  Musculoskeletal: Negative for neck pain and neck stiffness.  Neurological: Negative for dizziness, tremors, seizures, syncope, facial asymmetry, speech difficulty, weakness, light-headedness, numbness and headaches.  Psychiatric/Behavioral: Negative for behavioral problems, confusion and agitation. The patient is not nervous/anxious.      Past Medical History  Diagnosis Date  . LBBB (left bundle branch block)   . Carotid stenosis     a. 10/19/2011 carotid duplex - Mild hard plaque bilaterally. Stable 40-59% bilateral ICA stenosis. Carotid US (04/2013):  Bilateral 40-59% ICA.  F/u 1 year  . Dyslipidemia   . Hypertension   . Chronic combined systolic and diastolic CHF (congestive heart failure)     a. EF as low as 25% in 2006;  b. EF 60-65% in 12/2011;  c. 02/2013 Echo: EF 35-40,  mod mid-dist antsept HK, Gr 2 DD, mild LVH.  . Back pain, chronic     "just when I walk; mass on 3rd and 4th vertebrae right lower back"  . Cardiomyopathy, ischemic     a. 2012 s/p SJM 3231-40 Uni BiV ICD, ser # [id number].  Marland Kitchen CAD (coronary artery disease)     a. 05/2001 CABG x4: LIMA->LAD, VG->D1, VG->D2, VG->RCA.  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 CKD (chronic kidney disease), stage III   . Biventricular implantable cardiac defibrillator in situ 2007, 2012    a. 2007;  b. 2012 Gen change: SJM 3231-40 Uni BiV ICD, ser # [id number].  Marland Kitchen Hypoxemia requiring supplemental oxygen   . Candidiasis of vulva and vagina   . Hypothyroidism   . 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 Benay Spice, in remission  . History of pneumonia 12/27/11    "3 times; it's been a long time ago"  . OSA on CPAP   . Gout ~ 08/2011  . PAF (paroxysmal atrial fibrillation)     on Coumadin  . DM (diabetes mellitus), type 2 with complications     insulin dependent, retinopathy, neuropathy  . Mural thrombus of left ventricle     before 2003, while on Coumadin, No h/o CVA  . Morbid obesity with BMI of 45.0-49.9, adult     Ht. 5'4". BMI 47.2  . Vitamin D deficiency 06/09/2012    historical  . Olecranon bursitis of right elbow 12/29/2012  . History of  recurrent UTIs   . Diabetes 05/24/2013  . Hypercalcemia 09/26/2013  . Anemia, unspecified 12/13/2013  . Renal insufficiency 03/14/2014    Following with [facility], Dr Loyal Buba  . Medicare annual wellness visit, subsequent 09/12/2014    Medication: Reviewed with patient and updated  Preferred Pharmacy and which med where: 90 day supply/mail order: [place] [facility]: [place]  DM supplies: [facility]   Allergies verified: UTD   Immunization Status: Flu vaccine-- 01/12/14 Tdap-- 01/28/12 PNA-- 03/11/13 (23)  Shingles--08/19/07  MMG-- 06/25/08 with Johnnette Gourd, MD  at [place] History  . Marital Status: Married    Spouse Name: N/A  . Number of Children: Y  . Years of Education: N/A   Occupational History  . 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    Social History Main Topics  . Smoking status: Never Smoker   . Smokeless tobacco: Never Used  . Alcohol Use: No  . Drug Use: No  . Sexual Activity: Not Currently     Comment: lives with husband, no major dietary restrictions   Other Topics Concern  . Not on file   Social History Narrative   Lives in [place] with her husband.  She does not routinely exercise or adhere to any particular diet.      Past Surgical History  Procedure Laterality Date  . Cardiac catheterization  06/03/01  . Cholecystectomy  1970  . Tubal ligation  1972  . Abdominal hysterectomy  1982  . Insert / replace / remove pacemaker  2007; 2012    w/AICD  . Coronary artery bypass graft  2003    CABG X4  . Cataract extraction      left eye  . Cardioversion N/A 07/30/2014    Procedure: CARDIOVERSION;  Surgeon: Jolaine Artist, MD;  [facility]: [facility] ENDOSCOPY;  Service: Cardiovascular;  Laterality: N/A;  . Refractive surgery      right eye    Family History  Problem Relation Age of Onset  . Kidney cancer Mother     kidney and female repo - died @ 71  . Asthma Mother   . Cancer Mother     gyn and renal  . Heart disease Mother     CAD  . Heart disease Father     died @ 54  . Stroke Father   . Diabetes Father   . Hypertension Father   . Hyperlipidemia Father   . Hypertension Son   . Hypertension Maternal Aunt   . Kidney cancer Maternal Uncle   . Cancer Maternal Uncle   . Cirrhosis Maternal Grandmother     non alcohol  . Cancer Maternal Grandfather   . Kidney cancer Maternal Grandfather     Allergies  Allergen Reactions  . Sulfonamide Derivatives Other (See Comments)    Unknown; "childhood allergy/mother"    Current Outpatient Prescriptions on File Prior to Visit    Medication Sig Dispense Refill  . acetaminophen (TYLENOL) 500 MG tablet Take 1,000 mg by mouth every 6 (six) hours as needed (pain).    Marland Kitchen albuterol (PROVENTIL HFA;VENTOLIN HFA) 108 (90 BASE) MCG/ACT inhaler Inhale 2 puffs into the lungs every 6 (six) hours as needed for wheezing or shortness of breath. 1 Inhaler 1  . atorvastatin (LIPITOR) 20 MG tablet Take 1 tablet (20 mg total) by mouth daily. 90 tablet 1  . enalapril (VASOTEC) 2.5 MG tablet TAKE 1 TABLET TWICE DAILY.  180 tablet 6  . escitalopram (LEXAPRO) 10 MG tablet Take 1 tablet (10 mg total) by mouth daily. 90 tablet 1  . fenofibrate (TRICOR) 48 MG tablet Take 1 tablet (48 mg total) by mouth daily. 90 tablet 3  . fluticasone (FLONASE) 50 MCG/ACT nasal spray Place 2 sprays into both nostrils daily as needed for allergies or rhinitis. As needed for nasal stuffiness. 16 g 0  . gabapentin (NEURONTIN) 300 MG capsule Take 1 capsule (300 mg total) by mouth 2 (two) times daily. 180 capsule 1  . glucose blood (FREESTYLE LITE) test strip DX: 250.60  Check sugars bid and as needed 100 each 2  . insulin NPH (HUMULIN N,NOVOLIN N) 100 UNIT/ML injection Inject 20-45 Units into the skin 2 (two) times daily before a meal. Take 45 units in the morning and 20 units in the evening    . insulin regular (NOVOLIN R,HUMULIN R) 100 units/mL injection Change to 15 units three times a with meals (to cover all meals--this is the same total amount but spread out) (Patient taking differently: Inject 15 Units into the skin 2 (two) times daily before a meal. Change to 15 units three times a with meals (to cover all meals--this is the same total amount but spread out)) 10 mL 12  . levofloxacin (LEVAQUIN) 500 MG tablet Take 1 tablet (500 mg total) by mouth daily. Continuation of hospital course. 1 tablet 0  . levothyroxine (SYNTHROID, LEVOTHROID) 25 MCG tablet TAKE 1 TABLET EVERY DAY BEFORE BREAKFAST 90 tablet 1  . LORazepam (ATIVAN) 0.5 MG tablet Take 1 tablet (0.5 mg  total) by mouth at bedtime as needed for anxiety. 90 tablet 1  . NON FORMULARY Oxygen---24 hour    . torsemide (DEMADEX) 20 MG tablet Take 2 tablets (40 mg total) by mouth once. may take an extra tab for 3# weight gain, swelling or SOB (Patient taking differently: Take 40 mg by mouth once. may take an extra tab for 3# weight gain, swelling or SOB Takes one in the morning and one at 3:30) 135 tablet 3  . Vitamin D, Ergocalciferol, (DRISDOL) 50000 UNITS CAPS capsule Take 50,000 Units by mouth every 7 (seven) days. Tuesdays    . warfarin (COUMADIN) 5 MG tablet Take 7.5 mg by mouth daily at 6 PM. Monday and Friday takes 5mg     And all other days takes 7.5mg      No current facility-administered medications on file prior to visit.    BP 110/73 mmHg  Pulse 72  Temp(Src) 97.9 F (36.6 C) (Oral)  Wt 261 lb 3.2 oz (118.48 kg)  SpO2 93%        Objective:   Physical Exam   General Mental Status- Alert. General Appearance- Not in acute distress.   Skin General: Color- Normal Color. Moisture- Normal Moisture.  Neck Carotid Arteries- Normal color. Moisture- Normal Moisture. No carotid bruits. No JVD.  Chest and Lung Exam Auscultation: Breath Sounds:-. CTA, but mild shallow  Cardiovascular Auscultation:Rythm- Regular, rate and rhythm. Murmurs & Other Heart Sounds:Auscultation of the heart reveals- No Murmurs.  Abdomen Inspection:-Inspeection Normal. Palpation/Percussion:Note:No mass. Palpation and Percussion of the abdomen reveal- Non Tender, Non Distended + BS, no rebound or guarding.    Neurologic Cranial Nerve exam:- CN III-XII intact(No nystagmus), symmetric smile. Strength:- 5/5 equal and symmetric strength both upper and lower extremities.  Lower ext- no pedal edema. Calfs symmetric. Neg homans signs.     Assessment & Plan:

## 2014-10-07 ENCOUNTER — Ambulatory Visit (INDEPENDENT_AMBULATORY_CARE_PROVIDER_SITE_OTHER): Payer: Medicare Other | Admitting: *Deleted

## 2014-10-07 DIAGNOSIS — I482 Chronic atrial fibrillation, unspecified: Secondary | ICD-10-CM

## 2014-10-07 DIAGNOSIS — I4891 Unspecified atrial fibrillation: Secondary | ICD-10-CM | POA: Diagnosis not present

## 2014-10-07 DIAGNOSIS — Z5181 Encounter for therapeutic drug level monitoring: Secondary | ICD-10-CM

## 2014-10-07 DIAGNOSIS — I6350 Cerebral infarction due to unspecified occlusion or stenosis of unspecified cerebral artery: Secondary | ICD-10-CM

## 2014-10-07 DIAGNOSIS — I639 Cerebral infarction, unspecified: Secondary | ICD-10-CM

## 2014-10-07 LAB — POCT INR: INR: 2.1

## 2014-10-13 ENCOUNTER — Encounter: Payer: Self-pay | Admitting: Internal Medicine

## 2014-10-13 ENCOUNTER — Ambulatory Visit (INDEPENDENT_AMBULATORY_CARE_PROVIDER_SITE_OTHER): Payer: Medicare Other | Admitting: Internal Medicine

## 2014-10-13 VITALS — BP 110/56 | HR 76 | Ht 64.0 in | Wt 265.6 lb

## 2014-10-13 DIAGNOSIS — I639 Cerebral infarction, unspecified: Secondary | ICD-10-CM | POA: Diagnosis not present

## 2014-10-13 DIAGNOSIS — Z95810 Presence of automatic (implantable) cardiac defibrillator: Secondary | ICD-10-CM

## 2014-10-13 DIAGNOSIS — I5042 Chronic combined systolic (congestive) and diastolic (congestive) heart failure: Secondary | ICD-10-CM

## 2014-10-13 DIAGNOSIS — I4891 Unspecified atrial fibrillation: Secondary | ICD-10-CM

## 2014-10-13 DIAGNOSIS — I255 Ischemic cardiomyopathy: Secondary | ICD-10-CM

## 2014-10-13 LAB — CUP PACEART INCLINIC DEVICE CHECK
Battery Remaining Longevity: 26.4 mo
Brady Statistic RV Percent Paced: 84 %
Date Time Interrogation Session: 20160601150136
HIGH POWER IMPEDANCE MEASURED VALUE: 47.7085
Lead Channel Impedance Value: 312.5 Ohm
Lead Channel Impedance Value: 362.5 Ohm
Lead Channel Pacing Threshold Amplitude: 0.75 V
Lead Channel Pacing Threshold Amplitude: 1.25 V
Lead Channel Pacing Threshold Amplitude: 1.25 V
Lead Channel Pacing Threshold Pulse Width: 0.5 ms
Lead Channel Pacing Threshold Pulse Width: 0.6 ms
Lead Channel Sensing Intrinsic Amplitude: 11.7 mV
Lead Channel Setting Pacing Amplitude: 2.5 V
Lead Channel Setting Pacing Pulse Width: 0.6 ms
Lead Channel Setting Sensing Sensitivity: 0.5 mV
MDC IDC MSMT LEADCHNL LV IMPEDANCE VALUE: 275 Ohm
MDC IDC MSMT LEADCHNL RA PACING THRESHOLD PULSEWIDTH: 0.5 ms
MDC IDC MSMT LEADCHNL RA SENSING INTR AMPL: 0.8 mV
MDC IDC SET LEADCHNL LV PACING AMPLITUDE: 2 V
MDC IDC SET LEADCHNL RA PACING AMPLITUDE: 1.75 V
MDC IDC SET LEADCHNL RV PACING PULSEWIDTH: 0.5 ms
MDC IDC STAT BRADY RA PERCENT PACED: 66 %
Pulse Gen Serial Number: 631685
Zone Setting Detection Interval: 250 ms
Zone Setting Detection Interval: 310 ms
Zone Setting Detection Interval: 345 ms

## 2014-10-13 MED ORDER — AMIODARONE HCL 200 MG PO TABS: 200.0000 mg | ORAL_TABLET | Freq: Two times a day (BID) | ORAL | Status: DC

## 2014-10-13 NOTE — Progress Notes (Signed)
HPI Destiny [name] returns today for followup. She is a very pleasant 74 year old woman with an ischemic cardiomyopathy, chronic systolic heart failure, left bundle branch block, status post biventricular ICD implantation. In the interim she has been in the hospital with atrial fib and chf and pneumonia.  She is chronically oxygen dependent and desaturates at rest without oxygen. She does have morbid obesity and hypertension. She notes mild peripheral edema. No syncope.   Allergies  Allergen Reactions  . Sulfonamide Derivatives Other (See Comments)    Unknown; "childhood allergy/mother"     Current Outpatient Prescriptions  Medication Sig Dispense Refill  . acetaminophen (TYLENOL) 500 MG tablet Take 1,000 mg by mouth every 6 (six) hours as needed (pain).    Marland Kitchen albuterol (PROVENTIL HFA;VENTOLIN HFA) 108 (90 BASE) MCG/ACT inhaler Inhale 2 puffs into the lungs every 6 (six) hours as needed for wheezing or shortness of breath. 1 Inhaler 1  . atorvastatin (LIPITOR) 20 MG tablet Take 1 tablet (20 mg total) by mouth daily. 90 tablet 1  . carvedilol (COREG) 25 MG tablet Take 1 tablet by mouth 2 (two) times daily.    . enalapril (VASOTEC) 2.5 MG tablet TAKE 1 TABLET TWICE DAILY. 180 tablet 6  . escitalopram (LEXAPRO) 10 MG tablet Take 1 tablet (10 mg total) by mouth daily. 90 tablet 1  . fenofibrate (TRICOR) 48 MG tablet Take 1 tablet (48 mg total) by mouth daily. 90 tablet 3  . fluticasone (FLONASE) 50 MCG/ACT nasal spray Place 2 sprays into both nostrils daily as needed for allergies or rhinitis. As needed for nasal stuffiness. 16 g 0  . gabapentin (NEURONTIN) 300 MG capsule Take 1 capsule (300 mg total) by mouth 2 (two) times daily. 180 capsule 1  . glucose blood (FREESTYLE LITE) test strip DX: 250.60  Check sugars bid and as needed 100 each 2  . insulin NPH (HUMULIN N,NOVOLIN N) 100 UNIT/ML injection Inject 20-45 Units into the skin 2 (two) times daily before a meal. Take 45 units in the morning and  20 units in the evening    . insulin regular (NOVOLIN R,HUMULIN R) 100 units/mL injection Change to 15 units three times a with meals (to cover all meals--this is the same total amount but spread out) (Patient taking differently: Inject 15 Units into the skin 2 (two) times daily before a meal. Change to 15 units three times a with meals (to cover all meals--this is the same total amount but spread out)) 10 mL 12  . levothyroxine (SYNTHROID, LEVOTHROID) 25 MCG tablet TAKE 1 TABLET EVERY DAY BEFORE BREAKFAST 90 tablet 1  . LORazepam (ATIVAN) 0.5 MG tablet Take 1 tablet (0.5 mg total) by mouth at bedtime as needed for anxiety. 90 tablet 1  . NON FORMULARY Oxygen---24 hour    . torsemide (DEMADEX) 20 MG tablet Take 2 tablets (40 mg total) by mouth once. may take an extra tab for 3# weight gain, swelling or SOB (Patient taking differently: Take 40 mg by mouth once. may take an extra tab for 3# weight gain, swelling or SOB Takes one in the morning and one at 3:30) 135 tablet 3  . Vitamin D, Ergocalciferol, (DRISDOL) 50000 UNITS CAPS capsule Take 50,000 Units by mouth every 7 (seven) days. Tuesdays    . warfarin (COUMADIN) 5 MG tablet Take 7.5 mg by mouth daily at 6 PM. Monday and Friday takes 5mg     And all other days takes 7.5mg      No current facility-administered medications for this  visit.     Past Medical History  Diagnosis Date  . LBBB (left bundle branch block)   . Carotid stenosis     a. 10/19/2011 carotid duplex - Mild hard plaque bilaterally. Stable 40-59% bilateral ICA stenosis. Carotid US (04/2013):  Bilateral 40-59% ICA.  F/u 1 year  . Dyslipidemia   . Hypertension   . Chronic combined systolic and diastolic CHF (congestive heart failure)     a. EF as low as 25% in 2006;  b. EF 60-65% in 12/2011;  c. 02/2013 Echo: EF 35-40, mod mid-dist antsept HK, Gr 2 DD, mild LVH.  . Back pain, chronic     "just when I walk; mass on 3rd and 4th vertebrae right lower back"  . Cardiomyopathy, ischemic      a. 2012 s/p SJM 3231-40 Uni BiV ICD, ser # [id number].  Marland Kitchen CAD (coronary artery disease)     a. 05/2001 CABG x4: LIMA->LAD, VG->D1, VG->D2, VG->RCA.  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 CKD (chronic kidney disease), stage III   . Biventricular implantable cardiac defibrillator in situ 2007, 2012    a. 2007;  b. 2012 Gen change: SJM 3231-40 Uni BiV ICD, ser # [id number].  Marland Kitchen Hypoxemia requiring supplemental oxygen   . Candidiasis of vulva and vagina   . Hypothyroidism   . 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 Benay Spice, in remission  . History of pneumonia 12/27/11    "3 times; it's been a long time ago"  . OSA on CPAP   . Gout ~ 08/2011  . PAF (paroxysmal atrial fibrillation)     on Coumadin  . DM (diabetes mellitus), type 2 with complications     insulin dependent, retinopathy, neuropathy  . Mural thrombus of left ventricle     before 2003, while on Coumadin, No h/o CVA  . Morbid obesity with BMI of 45.0-49.9, adult     Ht. 5'4". BMI 47.2  . Vitamin D deficiency 06/09/2012    historical  . Olecranon bursitis of right elbow 12/29/2012  . History of recurrent UTIs   . Diabetes 05/24/2013  . Hypercalcemia 09/26/2013  . Anemia, unspecified 12/13/2013  . Renal insufficiency 03/14/2014    Following with [facility], Dr Loyal Buba  . Medicare annual wellness visit, subsequent 09/12/2014    Medication: Reviewed with patient and updated  Preferred Pharmacy and which med where: 90 day supply/mail order: [facility]: [place]  DM supplies: [facility]   Allergies verified: UTD   Immunization Status: Flu vaccine-- 01/12/14 Tdap-- 01/28/12 PNA-- 03/11/13 (23)  Shingles--08/19/07  MMG-- 06/25/08 with Johnnette Gourd, MD at [place]:   All systems reviewed and negative except as noted in the HPI.   Past Surgical History  Procedure Laterality Date  . Cardiac  catheterization  06/03/01  . Cholecystectomy  1970  . Tubal ligation  1972  . Abdominal hysterectomy  1982  . Insert / replace / remove pacemaker  2007; 2012    w/AICD  . Coronary artery bypass graft  2003    CABG X4  . Cataract extraction      left eye  . Cardioversion N/A 07/30/2014    Procedure: CARDIOVERSION;  Surgeon: Jolaine Artist, MD;  [facility] ENDOSCOPY;  Service: Cardiovascular;  Laterality: N/A;  . Refractive surgery      right eye     Family History  Problem Relation Age of Onset  . Kidney cancer Mother     kidney and female repo - died @ 29  . Asthma Mother   . Cancer Mother     gyn and renal  . Heart disease Mother     CAD  . Heart disease Father     died @ 88  . Stroke Father   . Diabetes Father   . Hypertension Father   . Hyperlipidemia Father   . Hypertension Son   . Hypertension Maternal Aunt   . Kidney cancer Maternal Uncle   . Cancer Maternal Uncle   . Cirrhosis Maternal Grandmother     non alcohol  . Cancer Maternal Grandfather   . Kidney cancer Maternal Grandfather      History   Social History  . Marital Status: Married    Spouse Name: N/A  . Number of Children: Y  . Years of Education: N/A   Occupational History  . 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    Social History Main Topics  . Smoking status: Never Smoker   . Smokeless tobacco: Never Used  . Alcohol Use: No  . Drug Use: No  . Sexual Activity: Not Currently     Comment: lives with husband, no major dietary restrictions   Other Topics Concern  . Not on file   Social History Narrative   Lives in [place] with her husband.  She does not routinely exercise or adhere to any particular diet.       BP 110/56 mmHg  Pulse 76  Ht 5\' 4"  (1.626 m)  Wt 265 lb 9.6 oz (120.475 kg)  BMI 45.57 kg/m2  SpO2 97%  Physical Exam:  stable appearing morbidly obese, 74 year old woman, NAD HEENT: Unremarkable except for oxygen by nasal cannula Neck:  6 cm JVD, no  thyromegally Back:  No CVA tenderness Lungs:  Clear except for basilar rales. No wheezes, no rhonchi. Well-healed ICD incision. HEART:  Regular rate rhythm, no murmurs, no rubs, no clicks Abd:  soft, positive bowel sounds, obese, no organomegally, no rebound, no guarding Ext:  2 plus pulses, no edema, no cyanosis, no clubbing Skin:  No rashes no nodules Neuro:  CN II through XII intact, motor grossly intact  DEVICE  Normal device function.  See PaceArt for details. BiV pacing is down.  Assess/Plan:

## 2014-10-13 NOTE — Assessment & Plan Note (Signed)
Her symptoms are class 3. I think she would be better if in NSR and will attempt this. She will continue her chronic CHF meds and maintain a low sodium diet.

## 2014-10-13 NOTE — Assessment & Plan Note (Signed)
Her morbid obesity definitely makes her more difficult to treat. Much of her pulmonary problem is related to her massive obesity.

## 2014-10-13 NOTE — Patient Instructions (Addendum)
Medication Instructions:  Your physician has recommended you make the following change in your medication:  1) START Amiodarone 200 mg tablets by mouth TWICE daily   Labwork: Need Coumadin appointment in 5 DAYS - in our coumadin clinic  Testing/Procedures: Your physician has recommended that you have a pulmonary function test. Pulmonary Function Tests are a group of tests that measure how well air moves in and out of your lungs.   Follow-Up: Your physician recommends that you schedule a follow-up appointment in: 1 month with Dr. Lovena Le.   Any Other Special Instructions Will Be Listed Below (If Applicable).

## 2014-10-13 NOTE — Assessment & Plan Note (Signed)
She now has persistent atrial fib and has done poorly. I have discussed the treatment options with the patient. There are no good options. However, she is guaranteed to do badly in atrial fibrillation.  I have recommended a trial of amiodarone. I would try to keep her on amio for 6 months, get her back to NSR and then stop amiodarone. She will continue coumadin but will be referred back to the coumadin clinic in a week as she will likely not need to take as much coumadin.

## 2014-10-13 NOTE — Assessment & Plan Note (Signed)
Her biv ICD is pacing 80% of the time, and much of this is during conduction.

## 2014-10-14 ENCOUNTER — Other Ambulatory Visit: Payer: Self-pay

## 2014-10-14 MED ORDER — AMIODARONE HCL 200 MG PO TABS: 200.0000 mg | ORAL_TABLET | Freq: Two times a day (BID) | ORAL | Status: DC

## 2014-10-15 ENCOUNTER — Ambulatory Visit (INDEPENDENT_AMBULATORY_CARE_PROVIDER_SITE_OTHER): Payer: Medicare Other | Admitting: Internal Medicine

## 2014-10-15 ENCOUNTER — Encounter: Payer: Self-pay | Admitting: Internal Medicine

## 2014-10-15 DIAGNOSIS — I5042 Chronic combined systolic (congestive) and diastolic (congestive) heart failure: Secondary | ICD-10-CM

## 2014-10-15 DIAGNOSIS — I4891 Unspecified atrial fibrillation: Secondary | ICD-10-CM

## 2014-10-15 DIAGNOSIS — I255 Ischemic cardiomyopathy: Secondary | ICD-10-CM

## 2014-10-15 LAB — PULMONARY FUNCTION TEST
DL/VA % pred: 74 %
DL/VA: 3.59 ml/min/mmHg/L
DLCO UNC % PRED: 44 %
DLCO UNC: 10.74 ml/min/mmHg
FEF 25-75 POST: 2.01 L/s
FEF 25-75 PRE: 1.33 L/s
FEF2575-%Change-Post: 51 %
FEF2575-%Pred-Post: 111 %
FEF2575-%Pred-Pre: 73 %
FEV1-%CHANGE-POST: 8 %
FEV1-%Pred-Post: 52 %
FEV1-%Pred-Pre: 48 %
FEV1-Post: 1.16 L
FEV1-Pre: 1.06 L
FEV1FVC-%Change-Post: 2 %
FEV1FVC-%Pred-Pre: 112 %
FEV6-%CHANGE-POST: 4 %
FEV6-%PRED-PRE: 44 %
FEV6-%Pred-Post: 46 %
FEV6-Post: 1.3 L
FEV6-Pre: 1.24 L
FEV6FVC-%Change-Post: -1 %
FEV6FVC-%PRED-POST: 101 %
FEV6FVC-%Pred-Pre: 103 %
FVC-%Change-Post: 6 %
FVC-%Pred-Post: 45 %
FVC-%Pred-Pre: 42 %
FVC-Post: 1.34 L
FVC-Pre: 1.25 L
POST FEV1/FVC RATIO: 86 %
PRE FEV1/FVC RATIO: 85 %
Post FEV6/FVC ratio: 97 %
Pre FEV6/FVC Ratio: 99 %

## 2014-10-15 NOTE — Progress Notes (Signed)
PFT done today. 

## 2014-10-18 ENCOUNTER — Other Ambulatory Visit (HOSPITAL_COMMUNITY): Payer: Self-pay | Admitting: Cardiology

## 2014-10-18 DIAGNOSIS — I5022 Chronic systolic (congestive) heart failure: Secondary | ICD-10-CM

## 2014-10-20 ENCOUNTER — Ambulatory Visit (HOSPITAL_COMMUNITY)
Admission: RE | Admit: 2014-10-20 | Discharge: 2014-10-20 | Disposition: A | Discharge status: Home / Self Care | Payer: Medicare Other | Source: Ambulatory Visit | Attending: Internal Medicine | Admitting: Internal Medicine

## 2014-10-20 ENCOUNTER — Telehealth: Payer: Self-pay

## 2014-10-20 ENCOUNTER — Encounter (HOSPITAL_COMMUNITY): Payer: Self-pay

## 2014-10-20 ENCOUNTER — Ambulatory Visit (HOSPITAL_BASED_OUTPATIENT_CLINIC_OR_DEPARTMENT_OTHER)
Admission: RE | Admit: 2014-10-20 | Discharge: 2014-10-20 | Disposition: A | Discharge status: Home / Self Care | Payer: Medicare Other | Source: Ambulatory Visit | Attending: Internal Medicine | Admitting: Internal Medicine

## 2014-10-20 ENCOUNTER — Ambulatory Visit (INDEPENDENT_AMBULATORY_CARE_PROVIDER_SITE_OTHER): Payer: Medicare Other | Admitting: *Deleted

## 2014-10-20 VITALS — BP 108/62 | HR 71 | Wt 265.8 lb

## 2014-10-20 DIAGNOSIS — I052 Rheumatic mitral stenosis with insufficiency: Secondary | ICD-10-CM | POA: Diagnosis not present

## 2014-10-20 DIAGNOSIS — I5042 Chronic combined systolic (congestive) and diastolic (congestive) heart failure: Secondary | ICD-10-CM | POA: Diagnosis not present

## 2014-10-20 DIAGNOSIS — I6350 Cerebral infarction due to unspecified occlusion or stenosis of unspecified cerebral artery: Secondary | ICD-10-CM

## 2014-10-20 DIAGNOSIS — I5022 Chronic systolic (congestive) heart failure: Secondary | ICD-10-CM | POA: Diagnosis not present

## 2014-10-20 DIAGNOSIS — Z5181 Encounter for therapeutic drug level monitoring: Secondary | ICD-10-CM

## 2014-10-20 DIAGNOSIS — I4891 Unspecified atrial fibrillation: Secondary | ICD-10-CM | POA: Diagnosis not present

## 2014-10-20 DIAGNOSIS — I272 Other secondary pulmonary hypertension: Secondary | ICD-10-CM | POA: Diagnosis not present

## 2014-10-20 DIAGNOSIS — I639 Cerebral infarction, unspecified: Secondary | ICD-10-CM | POA: Diagnosis not present

## 2014-10-20 DIAGNOSIS — I482 Chronic atrial fibrillation, unspecified: Secondary | ICD-10-CM

## 2014-10-20 DIAGNOSIS — I509 Heart failure, unspecified: Secondary | ICD-10-CM | POA: Diagnosis present

## 2014-10-20 LAB — BASIC METABOLIC PANEL
ANION GAP: 9 (ref 5–15)
BUN: 74 mg/dL — ABNORMAL HIGH (ref 6–20)
CO2: 27 mmol/L (ref 22–32)
CREATININE: 2.91 mg/dL — AB (ref 0.44–1.00)
Calcium: 9.3 mg/dL (ref 8.9–10.3)
Chloride: 105 mmol/L (ref 101–111)
GFR calc Af Amer: 17 mL/min — ABNORMAL LOW (ref >60)
GFR calc non Af Amer: 15 mL/min — ABNORMAL LOW (ref >60)
Glucose, Bld: 52 mg/dL — ABNORMAL LOW (ref 65–99)
POTASSIUM: 4.9 mmol/L (ref 3.5–5.1)
Sodium: 141 mmol/L (ref 135–145)

## 2014-10-20 LAB — PROTIME-INR
INR: 5.7 ratio (ref 0.8–1.0)
PROTHROMBIN TIME: 60.6 s — AB (ref 9.6–13.1)

## 2014-10-20 LAB — POCT INR: INR: 6

## 2014-10-20 NOTE — Progress Notes (Signed)
  Echocardiogram 2D Echocardiogram has been performed.  Jennette Dubin 10/20/2014, 11:18 AM

## 2014-10-20 NOTE — Telephone Encounter (Signed)
INR addressed by Coumadin Clinic

## 2014-10-20 NOTE — Progress Notes (Signed)
[name], female   DOB: [birth year], 74 y.o.   MRN: [id number]  PCP: Dr Charlett Blake Nephrologist: Dr Marval Regal EP: Dr Lovena Le  Pulmonary: Dr Gwenette Greet  HPI: Destiny [name] is a 74 y.o. female with a history of CAD, status post CABG in 2003, ischemic cardiomyopathy with variable ejection fractions noted in the past (echo in 12/2011 demonstrated normal LV function with an EF 60-65%), status post CRT-D, chronic combined systolic and diastolic CHF, paroxysmal atrial fibrillation, prior LV mural thrombus, on chronic Coumadin therapy, CKD, T2DM. She has a prior admission to the hospital for acute renal failure in the setting of rhabdomyolysis (while taking colchicine and fenofibrate). Carotid US (12/14): Bilateral ICA stenosis 40-59%.    She was admitted 10/23-11/11/14 with acute on chronic combined systolic and diastolic CHF. ECHO with worsening LV function with an EF of 35-40%. Hospital stay was complicated by a/c renal failure and respiratory failure. Had short term intubation. She was made a DNR/DNI.  Discharge weight 256 lbs. She is on home oxygen 2L by nasal cannula and CPAP at night, OHS/OSA.   She returns for follow up.  Since the last visit she was evaluated by Dr Lovena Le and started on amio for persistent A fib. Overall feeling pretty good. Ongoing mild dyspnea but this is her baseline. Wears 3 liters [place] oxygen. Using CPAP nightly. Had PFTs last Friday.  Rides in motorized scooter. Weight at home 256-261 pounds. Taking all medications. Tries to follow low salt diet and limit salt intake. Uses a walker in the house. She is not taking atorvastatin currently as she had myalgias when it was increased to 40 mg daily.  Started back on Tricor as it was too expensive. No bleeding problems.   ECHO (03/06/13): EF 35-40% ECHO (09/07/13): EF 40%, grade II diastolic dysfunction, mildly decreased RV systolic function with normal RV size.  Carotid dopplers (1/16) with 40-59% bilateral ICA stenosis.   Labs   05/01/13 K 5.4 Creatinine 1.46 Pro BNP 646  05/19/13 K 5.0 Creatinine 1.38 3/15 LDL 41, HDL 38, creatinine 1.7 4/15 K 5.2, creatinine 1.8 09/16/13 K 4.6 Creatinine 1.6 10/15 K 5.2, creatinine 1.6, LDL 71, HDL 33, TGs 425, TSH normal 12/15 LDL 65, TGs 337  ROS: All systems negative except as listed in HPI, PMH and Problem List.  Past Medical History  Diagnosis Date  . LBBB (left bundle branch block)   . Carotid stenosis     a. 10/19/2011 carotid duplex - Mild hard plaque bilaterally. Stable 40-59% bilateral ICA stenosis. Carotid US (04/2013):  Bilateral 40-59% ICA.  F/u 1 year  . Dyslipidemia   . Hypertension   . Chronic combined systolic and diastolic CHF (congestive heart failure)     a. EF as low as 25% in 2006;  b. EF 60-65% in 12/2011;  c. 02/2013 Echo: EF 35-40, mod mid-dist antsept HK, Gr 2 DD, mild LVH.  . Back pain, chronic     "just when I walk; mass on 3rd and 4th vertebrae right lower back"  . Cardiomyopathy, ischemic     a. 2012 s/p SJM 3231-40 Uni BiV ICD, ser # [id number].  Marland Kitchen CAD (coronary artery disease)     a. 05/2001 CABG x4: LIMA->LAD, VG->D1, VG->D2, VG->RCA.  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 CKD (chronic kidney disease), stage III   . Biventricular implantable cardiac defibrillator in situ 2007, 2012    a. 2007;  b. 2012 Gen change: SJM  3231-40 Uni BiV ICD, ser # [id number].  Marland Kitchen Hypoxemia requiring supplemental oxygen   . Candidiasis of vulva and vagina   . Hypothyroidism   . 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 Benay Spice, in remission  . History of pneumonia 12/27/11    "3 times; it's been a long time ago"  . OSA on CPAP   . Gout ~ 08/2011  . PAF (paroxysmal atrial fibrillation)     on Coumadin  . DM (diabetes mellitus), type 2 with complications     insulin dependent, retinopathy, neuropathy  . Mural thrombus of left ventricle     before 2003, while on Coumadin, No h/o CVA  . Morbid obesity  with BMI of 45.0-49.9, adult     Ht. 5'4". BMI 47.2  . Vitamin D deficiency 06/09/2012    historical  . Olecranon bursitis of right elbow 12/29/2012  . History of recurrent UTIs   . Diabetes 05/24/2013  . Hypercalcemia 09/26/2013  . Anemia, unspecified 12/13/2013  . Renal insufficiency 03/14/2014    Following with [facility], Dr Loyal Buba  . Medicare annual wellness visit, subsequent 09/12/2014    Medication: Reviewed with patient and updated  Preferred Pharmacy and which med where: 90 day supply/mail order: [facility] [place]  DM supplies: [facility]   Allergies verified: UTD   Immunization Status: Flu vaccine-- 01/12/14 Tdap-- 01/28/12 PNA-- 03/11/13 (23)  Shingles--08/19/07  MMG-- 06/25/08 with Johnnette Gourd, MD at [facility]    Current Outpatient Prescriptions  Medication Sig Dispense Refill  . acetaminophen (TYLENOL) 500 MG tablet Take 1,000 mg by mouth every 6 (six) hours as needed (pain).    Marland Kitchen albuterol (PROVENTIL HFA;VENTOLIN HFA) 108 (90 BASE) MCG/ACT inhaler Inhale 2 puffs into the lungs every 6 (six) hours as needed for wheezing or shortness of breath. 1 Inhaler 1  . amiodarone (PACERONE) 200 MG tablet Take 1 tablet (200 mg total) by mouth 2 (two) times daily. 180 tablet 1  . atorvastatin (LIPITOR) 20 MG tablet Take 1 tablet (20 mg total) by mouth daily. 90 tablet 1  . carvedilol (COREG) 25 MG tablet Take 1 tablet by mouth 2 (two) times daily.    . enalapril (VASOTEC) 2.5 MG tablet TAKE 1 TABLET TWICE DAILY. 180 tablet 6  . escitalopram (LEXAPRO) 10 MG tablet Take 1 tablet (10 mg total) by mouth daily. 90 tablet 1  . fenofibrate (TRICOR) 48 MG tablet Take 1 tablet (48 mg total) by mouth daily. 90 tablet 3  . fluticasone (FLONASE) 50 MCG/ACT nasal spray Place 2 sprays into both nostrils daily as needed for allergies or rhinitis. As needed for nasal stuffiness. 16 g 0  . gabapentin (NEURONTIN) 300 MG capsule Take 1 capsule  (300 mg total) by mouth 2 (two) times daily. 180 capsule 1  . glucose blood (FREESTYLE LITE) test strip DX: 250.60  Check sugars bid and as needed 100 each 2  . insulin NPH (HUMULIN N,NOVOLIN N) 100 UNIT/ML injection Inject 20-45 Units into the skin 2 (two) times daily before a meal. Take 45 units in the morning and 15 units in the evening    . insulin regular (NOVOLIN R,HUMULIN R) 100 units/mL injection Change to 15 units three times a with meals (to cover all meals--this is the same total amount but spread out) (Patient taking differently: Inject 15 Units into the skin 2 (two) times daily before a meal. Change to 15 units three times a  with meals (to cover all meals--this is the same total amount but spread out)) 10 mL 12  . levothyroxine (SYNTHROID, LEVOTHROID) 25 MCG tablet TAKE 1 TABLET EVERY DAY BEFORE BREAKFAST 90 tablet 1  . LORazepam (ATIVAN) 0.5 MG tablet Take 1 tablet (0.5 mg total) by mouth at bedtime as needed for anxiety. 90 tablet 1  . NON FORMULARY Oxygen---24 hour    . torsemide (DEMADEX) 20 MG tablet Take 2 tablets (40 mg total) by mouth once. may take an extra tab for 3# weight gain, swelling or SOB (Patient taking differently: Take 40 mg by mouth once. may take an extra tab for 3# weight gain, swelling or SOB Takes one in the morning and one at 3:30) 135 tablet 3  . Vitamin D, Ergocalciferol, (DRISDOL) 50000 UNITS CAPS capsule Take 50,000 Units by mouth every 7 (seven) days. Tuesdays    . warfarin (COUMADIN) 5 MG tablet Take 7.5 mg by mouth daily at 6 PM. Monday and Friday takes 5mg     And all other days takes 7.5mg      No current facility-administered medications for this encounter.    Filed Vitals:   10/20/14 1122  BP: 108/62  Pulse: 71  Weight: 265 lb 12 oz (120.543 kg)  SpO2: 94%    PHYSICAL EXAM: General:  Obese. No resp difficulty; on 2 L continuous. Husband present . Arrived in scooter.  HEENT: normal Neck: supple. JVP 6-7 cm. Carotids 2+ bilaterally; no bruits.  No lymphadenopathy or thryomegaly appreciated. Cor: PMI normal. Regular rate & rhythm. No rubs, gallops.  1/6 early SEM RUSB. Lungs: clear Abdomen: Obese, soft, nontender, obese, nondistended. No hepatosplenomegaly. No bruits or masses. Good bowel sounds. Extremities: no cyanosis, clubbing, rash. No edema.  Neuro: alert & orientedx3, cranial nerves grossly intact. Moves all 4 extremities w/o difficulty. Affect pleasant.  ASSESSMENT & PLAN:  1) Chronic systolic HF: Ischemic cardiomyopathy s/p CRT-D ([employer]). Echo 08/2013: EF is about 40%. Todays ECHO ~30%% limited images floating mitral valve cord noted by Dr Haroldine Laws \ NYHA III symptoms  Volume status stable. Continue torsemide to 20 mg twice a day with an extra 20 mg as needed.  - Continue current Coreg 25 mg twice a day.  - Continue enalapril to 2.5 mg daily will not increase with CKD.  - No spironolactone for given CKD.  - May need to consider hydralazine/imdur next visit.   - Reinforced the need and importance of daily weights, a low sodium diet, and fluid restriction (less than 2 L a day). Instructed to call the HF clinic if weight increases more than 3 lbs overnight or 5 lbs in a week. 2) PAF: S/P DC-CV 07/28/14. Chronic A fib . Amiodarone started 10/13/2014 by Dr Lovena Le.  Will need TSH next month. Yearly eye exam. Continue BB and Coumadin. No bleeding problems. 3) OHS/OSA: Continue nightly CPAP and home oxygen.  4) CAD: s/p CABG.  No chest pain.  She is not on ASA given comadin use and stable CAD. Restart statin => she can tolerate atorvastatin 20 but not 40 due to myalgias.  5) Hyperlipidemia: Restart atorvastatin 20 mg daily (she has been able to tolerate this dose). Triglycerides very high, continue fenofibrate 48 mg daily.  6) Carotid stenosis: Repeat dopplers in 1/17.  7) CKD. Stage III:  Followed by Dr Marval Regal.   Follow up in 3 months  CLEGG,AMY NP-C  10/20/2014   Patient seen and examined with Darrick Grinder, NP. We discussed  all aspects of the encounter. I  agree with the assessment and plan as stated above.   Remains fairly stable. Volume status up earlier in the week but better with extra torsemide. I reviewed echo personally EF ~30%. Floating MV chord - which is stable from previous echo. Check labs. If renal function stable can try to titrate ACE. Needs weight loss.  Bensimhon, Daniel,MD 6:56 PM

## 2014-10-20 NOTE — Patient Instructions (Signed)
Follow up 3 months.  Do the following things EVERYDAY: 1) Weigh yourself in the morning before breakfast. Write it down and keep it in a log. 2) Take your medicines as prescribed 3) Eat low salt foods-Limit salt (sodium) to 2000 mg per day.  4) Stay as active as you can everyday 5) Limit all fluids for the day to less than 2 liters  

## 2014-10-20 NOTE — Telephone Encounter (Signed)
Lynn from the lab called with critical PT/INR. INR=5.73 PT=60.6  To Coumadin Clinic.

## 2014-10-20 NOTE — Progress Notes (Signed)
Advanced Heart Failure Medication Review by a Pharmacist  Does the patient  feel that his/her medications are working for him/her?  yes  Has the patient been experiencing any side effects to the medications prescribed?  no  Does the patient measure his/her own blood pressure or blood glucose at home?  no   Does the patient have any problems obtaining medications due to transportation or finances?   no  Understanding of regimen: good Understanding of indications: good Potential of compliance: good    Pharmacist comments: Patient presents to heart failure clinic with her husband and medications were reviewed with a pharmacist. She reports compliance to all of her medications and does not have any questions at this time. No medication discrepancies noted.   Megan E. Supple, Pharm.D Clinical Pharmacy Resident Pager: [phone] 10/20/2014 11:33 AM

## 2014-10-24 ENCOUNTER — Inpatient Hospital Stay (HOSPITAL_COMMUNITY)
Admission: EM | Admit: 2014-10-24 | Discharge: 2014-10-29 | DRG: 291 | Disposition: A | Discharge status: Home / Self Care | Payer: Medicare Other | Attending: Internal Medicine | Admitting: Internal Medicine

## 2014-10-24 ENCOUNTER — Emergency Department (HOSPITAL_COMMUNITY): Payer: Medicare Other

## 2014-10-24 ENCOUNTER — Encounter (HOSPITAL_COMMUNITY): Payer: Self-pay | Admitting: *Deleted

## 2014-10-24 DIAGNOSIS — J9610 Chronic respiratory failure, unspecified whether with hypoxia or hypercapnia: Secondary | ICD-10-CM | POA: Diagnosis present

## 2014-10-24 DIAGNOSIS — Z9981 Dependence on supplemental oxygen: Secondary | ICD-10-CM

## 2014-10-24 DIAGNOSIS — J810 Acute pulmonary edema: Secondary | ICD-10-CM

## 2014-10-24 DIAGNOSIS — E11649 Type 2 diabetes mellitus with hypoglycemia without coma: Secondary | ICD-10-CM | POA: Diagnosis present

## 2014-10-24 DIAGNOSIS — R609 Edema, unspecified: Secondary | ICD-10-CM

## 2014-10-24 DIAGNOSIS — J449 Chronic obstructive pulmonary disease, unspecified: Secondary | ICD-10-CM | POA: Diagnosis present

## 2014-10-24 DIAGNOSIS — G4733 Obstructive sleep apnea (adult) (pediatric): Secondary | ICD-10-CM | POA: Diagnosis present

## 2014-10-24 DIAGNOSIS — E875 Hyperkalemia: Secondary | ICD-10-CM | POA: Diagnosis not present

## 2014-10-24 DIAGNOSIS — R0602 Shortness of breath: Secondary | ICD-10-CM | POA: Diagnosis not present

## 2014-10-24 DIAGNOSIS — Z6841 Body Mass Index (BMI) 40.0 and over, adult: Secondary | ICD-10-CM

## 2014-10-24 DIAGNOSIS — E162 Hypoglycemia, unspecified: Secondary | ICD-10-CM | POA: Diagnosis present

## 2014-10-24 DIAGNOSIS — I509 Heart failure, unspecified: Secondary | ICD-10-CM

## 2014-10-24 DIAGNOSIS — E785 Hyperlipidemia, unspecified: Secondary | ICD-10-CM | POA: Diagnosis present

## 2014-10-24 DIAGNOSIS — E1140 Type 2 diabetes mellitus with diabetic neuropathy, unspecified: Secondary | ICD-10-CM | POA: Diagnosis present

## 2014-10-24 DIAGNOSIS — E1129 Type 2 diabetes mellitus with other diabetic kidney complication: Secondary | ICD-10-CM

## 2014-10-24 DIAGNOSIS — Z8572 Personal history of non-Hodgkin lymphomas: Secondary | ICD-10-CM | POA: Diagnosis present

## 2014-10-24 DIAGNOSIS — I481 Persistent atrial fibrillation: Secondary | ICD-10-CM | POA: Diagnosis not present

## 2014-10-24 DIAGNOSIS — E1122 Type 2 diabetes mellitus with diabetic chronic kidney disease: Secondary | ICD-10-CM | POA: Diagnosis not present

## 2014-10-24 DIAGNOSIS — E11319 Type 2 diabetes mellitus with unspecified diabetic retinopathy without macular edema: Secondary | ICD-10-CM | POA: Diagnosis present

## 2014-10-24 DIAGNOSIS — I129 Hypertensive chronic kidney disease with stage 1 through stage 4 chronic kidney disease, or unspecified chronic kidney disease: Secondary | ICD-10-CM | POA: Diagnosis present

## 2014-10-24 DIAGNOSIS — I447 Left bundle-branch block, unspecified: Secondary | ICD-10-CM | POA: Diagnosis present

## 2014-10-24 DIAGNOSIS — Z90710 Acquired absence of both cervix and uterus: Secondary | ICD-10-CM

## 2014-10-24 DIAGNOSIS — I480 Paroxysmal atrial fibrillation: Secondary | ICD-10-CM | POA: Diagnosis present

## 2014-10-24 DIAGNOSIS — R0600 Dyspnea, unspecified: Secondary | ICD-10-CM

## 2014-10-24 DIAGNOSIS — D649 Anemia, unspecified: Secondary | ICD-10-CM | POA: Diagnosis present

## 2014-10-24 DIAGNOSIS — Z95810 Presence of automatic (implantable) cardiac defibrillator: Secondary | ICD-10-CM | POA: Diagnosis present

## 2014-10-24 DIAGNOSIS — J9621 Acute and chronic respiratory failure with hypoxia: Secondary | ICD-10-CM | POA: Diagnosis not present

## 2014-10-24 DIAGNOSIS — I10 Essential (primary) hypertension: Secondary | ICD-10-CM | POA: Diagnosis present

## 2014-10-24 DIAGNOSIS — I5043 Acute on chronic combined systolic (congestive) and diastolic (congestive) heart failure: Secondary | ICD-10-CM | POA: Diagnosis not present

## 2014-10-24 DIAGNOSIS — E039 Hypothyroidism, unspecified: Secondary | ICD-10-CM | POA: Diagnosis present

## 2014-10-24 DIAGNOSIS — Z7901 Long term (current) use of anticoagulants: Secondary | ICD-10-CM

## 2014-10-24 DIAGNOSIS — Z882 Allergy status to sulfonamides status: Secondary | ICD-10-CM

## 2014-10-24 DIAGNOSIS — M109 Gout, unspecified: Secondary | ICD-10-CM | POA: Diagnosis present

## 2014-10-24 DIAGNOSIS — I2510 Atherosclerotic heart disease of native coronary artery without angina pectoris: Secondary | ICD-10-CM | POA: Diagnosis present

## 2014-10-24 DIAGNOSIS — I255 Ischemic cardiomyopathy: Secondary | ICD-10-CM | POA: Diagnosis present

## 2014-10-24 DIAGNOSIS — Z9049 Acquired absence of other specified parts of digestive tract: Secondary | ICD-10-CM | POA: Diagnosis present

## 2014-10-24 DIAGNOSIS — Z9851 Tubal ligation status: Secondary | ICD-10-CM

## 2014-10-24 DIAGNOSIS — Z9842 Cataract extraction status, left eye: Secondary | ICD-10-CM

## 2014-10-24 DIAGNOSIS — Z794 Long term (current) use of insulin: Secondary | ICD-10-CM

## 2014-10-24 DIAGNOSIS — N183 Chronic kidney disease, stage 3 (moderate): Secondary | ICD-10-CM | POA: Diagnosis present

## 2014-10-24 LAB — CBC WITH DIFFERENTIAL/PLATELET
BASOS ABS: 0 10*3/uL (ref 0.0–0.1)
Basophils Relative: 0 % (ref 0–1)
EOS PCT: 3 % (ref 0–5)
Eosinophils Absolute: 0.2 10*3/uL (ref 0.0–0.7)
HEMATOCRIT: 31.9 % — AB (ref 36.0–46.0)
Hemoglobin: 10.2 g/dL — ABNORMAL LOW (ref 12.0–15.0)
LYMPHS ABS: 1.4 10*3/uL (ref 0.7–4.0)
Lymphocytes Relative: 14 % (ref 12–46)
MCH: 29.2 pg (ref 26.0–34.0)
MCHC: 32 g/dL (ref 30.0–36.0)
MCV: 91.4 fL (ref 78.0–100.0)
MONOS PCT: 10 % (ref 3–12)
Monocytes Absolute: 0.9 10*3/uL (ref 0.1–1.0)
Neutro Abs: 7 10*3/uL (ref 1.7–7.7)
Neutrophils Relative %: 73 % (ref 43–77)
Platelets: 218 10*3/uL (ref 150–400)
RBC: 3.49 MIL/uL — ABNORMAL LOW (ref 3.87–5.11)
RDW: 15.2 % (ref 11.5–15.5)
WBC: 9.6 10*3/uL (ref 4.0–10.5)

## 2014-10-24 LAB — HEPATIC FUNCTION PANEL
ALBUMIN: 3.5 g/dL (ref 3.5–5.0)
ALT: 12 U/L — AB (ref 14–54)
AST: 14 U/L — ABNORMAL LOW (ref 15–41)
Alkaline Phosphatase: 37 U/L — ABNORMAL LOW (ref 38–126)
Bilirubin, Direct: 0.1 mg/dL (ref 0.1–0.5)
Indirect Bilirubin: 0.5 mg/dL (ref 0.3–0.9)
TOTAL PROTEIN: 7.2 g/dL (ref 6.5–8.1)
Total Bilirubin: 0.6 mg/dL (ref 0.3–1.2)

## 2014-10-24 LAB — PROTIME-INR
INR: 2.86 — AB (ref 0.00–1.49)
Prothrombin Time: 29.5 seconds — ABNORMAL HIGH (ref 11.6–15.2)

## 2014-10-24 LAB — BASIC METABOLIC PANEL
Anion gap: 11 (ref 5–15)
BUN: 71 mg/dL — ABNORMAL HIGH (ref 6–20)
CHLORIDE: 105 mmol/L (ref 101–111)
CO2: 25 mmol/L (ref 22–32)
CREATININE: 2.97 mg/dL — AB (ref 0.44–1.00)
Calcium: 8.9 mg/dL (ref 8.9–10.3)
GFR calc Af Amer: 17 mL/min — ABNORMAL LOW (ref >60)
GFR, EST NON AFRICAN AMERICAN: 15 mL/min — AB (ref >60)
Glucose, Bld: 50 mg/dL — ABNORMAL LOW (ref 65–99)
Potassium: 5 mmol/L (ref 3.5–5.1)
Sodium: 141 mmol/L (ref 135–145)

## 2014-10-24 LAB — TROPONIN I: Troponin I: <0.03 ng/mL (ref <0.031)

## 2014-10-24 LAB — BRAIN NATRIURETIC PEPTIDE: B Natriuretic Peptide: 215.2 pg/mL — ABNORMAL HIGH (ref 0.0–100.0)

## 2014-10-24 NOTE — ED Notes (Signed)
The pt is c/o sob for one week.  She is on home 02 and for the past 2 days her husband has increased her 02 liters from 2 to 3.  Alert  Rapid breathing

## 2014-10-24 NOTE — ED Provider Notes (Signed)
CSN: [id number]     Arrival date & time 10/24/14  2141 History   First MD Initiated Contact with Patient 10/24/14 2209     Chief Complaint  Patient presents with  . Shortness of Breath     (Consider location/radiation/quality/duration/timing/severity/associated sxs/prior Treatment) Patient is a 74 y.o. female presenting with shortness of breath. The history is provided by the patient.  Shortness of Breath Severity:  Mild Onset quality:  Gradual Duration:  1 week Timing:  Constant Progression:  Worsening Chronicity:  New Context comment:  At rest Relieved by:  Sitting up Exacerbated by: lying supine. Ineffective treatments:  None tried Associated symptoms: no abdominal pain, no chest pain, no cough, no fever, no headaches, no neck pain and no vomiting     Past Medical History  Diagnosis Date  . LBBB (left bundle branch block)   . Carotid stenosis     a. 10/19/2011 carotid duplex - Mild hard plaque bilaterally. Stable 40-59% bilateral ICA stenosis. Carotid US (04/2013):  Bilateral 40-59% ICA.  F/u 1 year  . Dyslipidemia   . Hypertension   . Chronic combined systolic and diastolic CHF (congestive heart failure)     a. EF as low as 25% in 2006;  b. EF 60-65% in 12/2011;  c. 02/2013 Echo: EF 35-40, mod mid-dist antsept HK, Gr 2 DD, mild LVH.  . Back pain, chronic     "just when I walk; mass on 3rd and 4th vertebrae right lower back"  . Cardiomyopathy, ischemic     a. 2012 s/p SJM 3231-40 Uni BiV ICD, ser # [id number].  Marland Kitchen CAD (coronary artery disease)     a. 05/2001 CABG x4: LIMA->LAD, VG->D1, VG->D2, VG->RCA.  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 CKD (chronic kidney disease), stage III   . Biventricular implantable cardiac defibrillator in situ 2007, 2012    a. 2007;  b. 2012 Gen change: SJM 3231-40 Uni BiV ICD, ser # [id number].  Marland Kitchen Hypoxemia requiring supplemental oxygen   . Candidiasis of vulva and vagina   . Hypothyroidism   . 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 Benay Spice, in remission  . History of pneumonia 12/27/11    "3 times; it's been a long time ago"  . OSA on CPAP   . Gout ~ 08/2011  . PAF (paroxysmal atrial fibrillation)     on Coumadin  . DM (diabetes mellitus), type 2 with complications     insulin dependent, retinopathy, neuropathy  . Mural thrombus of left ventricle     before 2003, while on Coumadin, No h/o CVA  . Morbid obesity with BMI of 45.0-49.9, adult     Ht. 5'4". BMI 47.2  . Vitamin D deficiency 06/09/2012    historical  . Olecranon bursitis of right elbow 12/29/2012  . History of recurrent UTIs   . Diabetes 05/24/2013  . Hypercalcemia 09/26/2013  . Anemia, unspecified 12/13/2013  . Renal insufficiency 03/14/2014    Following with [facility], Dr Loyal Buba  . Medicare annual wellness visit, subsequent 09/12/2014    Medication: Reviewed with patient and updated  Preferred Pharmacy and which med where: 90 day supply/mail order: [place] [facility]: [place]  DM supplies: [facility]   Allergies verified: UTD   Immunization Status: Flu vaccine-- 01/12/14 Tdap-- 01/28/12 PNA-- 03/11/13 (23)  Shingles--08/19/07  MMG-- 06/25/08 with Johnnette Gourd, MD at [facility]   Past Surgical History  Procedure Laterality Date  . Cardiac catheterization  06/03/01  . Cholecystectomy  1970  . Tubal ligation  1972  . Abdominal hysterectomy  1982  . Insert / replace / remove pacemaker  2007; 2012    w/AICD  . Coronary artery bypass graft  2003    CABG X4  . Cataract extraction      left eye  . Cardioversion N/A 07/30/2014    Procedure: CARDIOVERSION;  Surgeon: Jolaine Artist, MD;  Location: [facility] ENDOSCOPY;  Service: Cardiovascular;  Laterality: N/A;  . Refractive surgery      right eye   Family History  Problem Relation Age of Onset  . Kidney cancer Mother     kidney and female repo - died @ 60  . Asthma Mother   . Cancer Mother      gyn and renal  . Heart disease Mother     CAD  . Heart disease Father     died @ 57  . Stroke Father   . Diabetes Father   . Hypertension Father   . Hyperlipidemia Father   . Hypertension Son   . Hypertension Maternal Aunt   . Kidney cancer Maternal Uncle   . Cancer Maternal Uncle   . Cirrhosis Maternal Grandmother     non alcohol  . Cancer Maternal Grandfather   . Kidney cancer Maternal Grandfather    History  Substance Use Topics  . Smoking status: Never Smoker   . Smokeless tobacco: Never Used  . Alcohol Use: No   OB History    No data available     Review of Systems  Constitutional: Negative for fever and fatigue.  HENT: Negative for congestion and drooling.   Eyes: Negative for pain.  Respiratory: Positive for shortness of breath. Negative for cough.   Cardiovascular: Negative for chest pain.  Gastrointestinal: Negative for nausea, vomiting, abdominal pain and diarrhea.  Genitourinary: Negative for dysuria and hematuria.  Musculoskeletal: Negative for back pain, gait problem and neck pain.  Skin: Negative for color change.  Neurological: Negative for dizziness and headaches.  Hematological: Negative for adenopathy.  Psychiatric/Behavioral: Negative for behavioral problems.  All other systems reviewed and are negative.     Allergies  Sulfonamide derivatives  Home Medications   Prior to Admission medications   Medication Sig Start Date End Date Taking? Authorizing Provider  acetaminophen (TYLENOL) 500 MG tablet Take 1,000 mg by mouth every 6 (six) hours as needed (pain).    Historical Provider, MD  albuterol (PROVENTIL HFA;VENTOLIN HFA) 108 (90 BASE) MCG/ACT inhaler Inhale 2 puffs into the lungs every 6 (six) hours as needed for wheezing or shortness of breath. 12/26/12   Mosie Lukes, MD  amiodarone (PACERONE) 200 MG tablet Take 1 tablet (200 mg total) by mouth 2 (two) times daily. 10/14/14   Evans Lance, MD  atorvastatin (LIPITOR) 20 MG tablet Take 1  tablet (20 mg total) by mouth daily. 08/31/14   Mosie Lukes, MD  carvedilol (COREG) 25 MG tablet Take 1 tablet by mouth 2 (two) times daily. 07/27/14   Historical Provider, MD  enalapril (VASOTEC) 2.5 MG tablet TAKE 1 TABLET TWICE DAILY. 04/26/14   Jolaine Artist, MD  escitalopram (LEXAPRO) 10 MG tablet Take 1 tablet (10 mg total) by mouth daily. 08/31/14   Mosie Lukes, MD  fenofibrate (TRICOR) 48 MG tablet Take 1 tablet (48 mg total) by mouth daily. 07/15/14   Larey Dresser, MD  fluticasone (FLONASE) 50 MCG/ACT nasal spray  Place 2 sprays into both nostrils daily as needed for allergies or rhinitis. As needed for nasal stuffiness. 01/13/14   Mosie Lukes, MD  gabapentin (NEURONTIN) 300 MG capsule Take 1 capsule (300 mg total) by mouth 2 (two) times daily. 08/31/14   Mosie Lukes, MD  glucose blood (FREESTYLE LITE) test strip DX: 250.60  Check sugars bid and as needed 05/22/13   Mosie Lukes, MD  insulin NPH (HUMULIN N,NOVOLIN N) 100 UNIT/ML injection Inject 20-45 Units into the skin 2 (two) times daily before a meal. Take 45 units in the morning and 15 units in the evening    Historical Provider, MD  insulin regular (NOVOLIN R,HUMULIN R) 100 units/mL injection Change to 15 units three times a with meals (to cover all meals--this is the same total amount but spread out) Patient taking differently: Inject 15 Units into the skin 2 (two) times daily before a meal. Change to 15 units three times a with meals (to cover all meals--this is the same total amount but spread out) 04/01/13   Ivan Anchors Love, PA-C  levothyroxine (SYNTHROID, LEVOTHROID) 25 MCG tablet TAKE 1 TABLET EVERY DAY BEFORE BREAKFAST 08/31/14   Mosie Lukes, MD  LORazepam (ATIVAN) 0.5 MG tablet Take 1 tablet (0.5 mg total) by mouth at bedtime as needed for anxiety. 08/31/14   Mosie Lukes, MD  NON FORMULARY Oxygen---24 hour    Historical Provider, MD  torsemide (DEMADEX) 20 MG tablet Take 2 tablets (40 mg total) by mouth once. may  take an extra tab for 3# weight gain, swelling or SOB Patient taking differently: Take 40 mg by mouth once. may take an extra tab for 3# weight gain, swelling or SOB Takes one in the morning and one at 3:30 07/15/14   Larey Dresser, MD  Vitamin D, Ergocalciferol, (DRISDOL) 50000 UNITS CAPS capsule Take 50,000 Units by mouth every 7 (seven) days. Tuesdays    Historical Provider, MD  warfarin (COUMADIN) 5 MG tablet Take 7.5 mg by mouth daily at 6 PM. Monday and Friday takes 5mg     And all other days takes 7.5mg     Historical Provider, MD   BP 132/83 mmHg  Pulse 106  Temp(Src) 98.1 F (36.7 C) (Oral)  Resp 26  Ht 5\' 4"  (1.626 m)  SpO2 93% Physical Exam  Constitutional: She is oriented to person, place, and time. She appears well-developed and well-nourished.  HENT:  Head: Normocephalic.  Mouth/Throat: No oropharyngeal exudate.  Eyes: Conjunctivae and EOM are normal. Pupils are equal, round, and reactive to light.  Neck: Normal range of motion. Neck supple.  Cardiovascular: Normal rate, regular rhythm, normal heart sounds and intact distal pulses.  Exam reveals no gallop and no friction rub.   No murmur heard. Pulmonary/Chest: She has no wheezes.  Mild tachypnea with diminished breath sounds bilaterally.  Abdominal: Soft. Bowel sounds are normal. There is no tenderness. There is no rebound and no guarding.  Musculoskeletal: Normal range of motion. She exhibits no edema or tenderness.  Neurological: She is alert and oriented to person, place, and time.  Skin: Skin is warm and dry.  Psychiatric: She has a normal mood and affect. Her behavior is normal.  Nursing note and vitals reviewed.   ED Course  Procedures (including critical care time) Labs Review Labs Reviewed  BASIC METABOLIC PANEL - Abnormal; Notable for the following:    Glucose, Bld 50 (*)    BUN 71 (*)    Creatinine, Ser 2.97 (*)  GFR calc non Af Amer 15 (*)    GFR calc Af Amer 17 (*)    All other components within  normal limits  BRAIN NATRIURETIC PEPTIDE - Abnormal; Notable for the following:    B Natriuretic Peptide 215.2 (*)    All other components within normal limits  CBC WITH DIFFERENTIAL/PLATELET - Abnormal; Notable for the following:    RBC 3.49 (*)    Hemoglobin 10.2 (*)    HCT 31.9 (*)    All other components within normal limits  PROTIME-INR - Abnormal; Notable for the following:    Prothrombin Time 29.5 (*)    INR 2.86 (*)    All other components within normal limits  HEPATIC FUNCTION PANEL - Abnormal; Notable for the following:    AST 14 (*)    ALT 12 (*)    Alkaline Phosphatase 37 (*)    All other components within normal limits  CBC WITH DIFFERENTIAL/PLATELET - Abnormal; Notable for the following:    RBC 3.54 (*)    Hemoglobin 10.3 (*)    HCT 32.6 (*)    Neutrophils Relative % 88 (*)    Lymphocytes Relative 10 (*)    Monocytes Relative 2 (*)    All other components within normal limits  COMPREHENSIVE METABOLIC PANEL - Abnormal; Notable for the following:    Potassium 5.9 (*)    Glucose, Bld 175 (*)    BUN 70 (*)    Creatinine, Ser 2.98 (*)    Calcium 8.5 (*)    AST 14 (*)    ALT 12 (*)    Alkaline Phosphatase 36 (*)    GFR calc non Af Amer 15 (*)    GFR calc Af Amer 17 (*)    All other components within normal limits  PROTIME-INR - Abnormal; Notable for the following:    Prothrombin Time 27.5 (*)    INR 2.61 (*)    All other components within normal limits  GLUCOSE, CAPILLARY - Abnormal; Notable for the following:    Glucose-Capillary 177 (*)    All other components within normal limits  GLUCOSE, CAPILLARY - Abnormal; Notable for the following:    Glucose-Capillary 169 (*)    All other components within normal limits  POTASSIUM - Abnormal; Notable for the following:    Potassium 6.0 (*)    All other components within normal limits  GLUCOSE, CAPILLARY - Abnormal; Notable for the following:    Glucose-Capillary 207 (*)    All other components within normal  limits  I-STAT ARTERIAL BLOOD GAS, ED - Abnormal; Notable for the following:    pH, Arterial 7.307 (*)    pCO2 arterial 53.5 (*)    pO2, Arterial 63.0 (*)    Bicarbonate 26.7 (*)    All other components within normal limits  MRSA PCR SCREENING  CULTURE, BLOOD (ROUTINE X 2)  CULTURE, BLOOD (ROUTINE X 2)  CULTURE, EXPECTORATED SPUTUM-ASSESSMENT  GRAM STAIN  TROPONIN I  STREP PNEUMONIAE URINARY ANTIGEN  INFLUENZA PANEL BY PCR (TYPE A & B, H1N1)(NOT AT [facility])  LACTIC ACID, PLASMA  TROPONIN I  BLOOD GAS, ARTERIAL  LEGIONELLA ANTIGEN, URINE  TROPONIN I  TROPONIN I  CBG MONITORING, ED    Imaging Review Dg Chest Portable 1 View  10/24/2014   CLINICAL DATA:  Acute onset of shortness of breath. Initial encounter.  EXAM: PORTABLE CHEST - 1 VIEW  COMPARISON:  Chest radiograph performed 10/05/2014  FINDINGS: The lungs are well-aerated. Vascular congestion is noted. Increased interstitial markings  and bibasilar airspace opacities raise concern for mild pulmonary edema. There is no evidence of pleural effusion or pneumothorax.  The cardiomediastinal silhouette is mildly enlarged. The patient is status post median sternotomy. A pacemaker/AICD is noted overlying the left chest wall, with leads ending overlying the right atrium, right ventricle and coronary sinus. No acute osseous abnormalities are seen.  IMPRESSION: Vascular congestion and mild cardiomegaly. Increased interstitial markings and bibasilar airspace opacities raise concern for mild pulmonary edema.   Electronically Signed   By: Garald Balding M.D.   On: 10/24/2014 22:32     EKG Interpretation   Date/Time:  Sunday October 24 2014 21:53:35 EDT Ventricular Rate:  78 PR Interval:    QRS Duration: 160 QT Interval:  426 QTC Calculation: 485 R Axis:   138 Text Interpretation:  paced rhythm Non-specific intra-ventricular  conduction block Possible Lateral infarct , age undetermined Confirmed by  Cecia Egge  MD, Kimberleigh Mehan ([id number]) on 10/24/2014  10:07:49 PM      MDM   Final diagnoses:  Acute pulmonary edema  SOB (shortness of breath)  CHF exacerbation    10:19 PM 74 y.o. female w hx of ischemic cardiomyopathy, chronic systolic heart failure, LBBB, status post biventricular ICD implantation, afib on amio and coumadin, DM who presents with worsening shortness of breath over the last week. She states that she had a lung test about one week ago and has felt more short of breath since then. She had some weight gain over the last week and doubled up on her diaphoretic for several days. Her husband notes that he had increased her nasal cannula from 2-3 L throughout the week and had to increase it to 5 L this evening. She notes worsening shortness of breath on lying supine. She was admitted about one month ago for pneumonia and found to be in atrial fibrillation. She is placed on amiodarone at that time. She notes she has had a cardioversion earlier this year. Vital signs unremarkable here. I turned the patient's oxygen down to 4 L via nasal cannula. We'll get screening labs and imaging. She denies any cp or cough.   Discussed w/ hospitalist (Dr. Posey Pronto who will admit).    Pamella Pert, MD 10/25/14 1158

## 2014-10-25 DIAGNOSIS — E875 Hyperkalemia: Secondary | ICD-10-CM | POA: Diagnosis not present

## 2014-10-25 DIAGNOSIS — I2510 Atherosclerotic heart disease of native coronary artery without angina pectoris: Secondary | ICD-10-CM | POA: Diagnosis present

## 2014-10-25 DIAGNOSIS — J9622 Acute and chronic respiratory failure with hypercapnia: Secondary | ICD-10-CM | POA: Diagnosis not present

## 2014-10-25 DIAGNOSIS — Z95810 Presence of automatic (implantable) cardiac defibrillator: Secondary | ICD-10-CM

## 2014-10-25 DIAGNOSIS — I5043 Acute on chronic combined systolic (congestive) and diastolic (congestive) heart failure: Secondary | ICD-10-CM | POA: Diagnosis not present

## 2014-10-25 DIAGNOSIS — E039 Hypothyroidism, unspecified: Secondary | ICD-10-CM | POA: Diagnosis present

## 2014-10-25 DIAGNOSIS — E1140 Type 2 diabetes mellitus with diabetic neuropathy, unspecified: Secondary | ICD-10-CM | POA: Diagnosis present

## 2014-10-25 DIAGNOSIS — Z794 Long term (current) use of insulin: Secondary | ICD-10-CM | POA: Diagnosis not present

## 2014-10-25 DIAGNOSIS — I4891 Unspecified atrial fibrillation: Secondary | ICD-10-CM | POA: Diagnosis not present

## 2014-10-25 DIAGNOSIS — Z7901 Long term (current) use of anticoagulants: Secondary | ICD-10-CM | POA: Diagnosis not present

## 2014-10-25 DIAGNOSIS — N183 Chronic kidney disease, stage 3 (moderate): Secondary | ICD-10-CM | POA: Diagnosis present

## 2014-10-25 DIAGNOSIS — G4733 Obstructive sleep apnea (adult) (pediatric): Secondary | ICD-10-CM | POA: Diagnosis present

## 2014-10-25 DIAGNOSIS — I447 Left bundle-branch block, unspecified: Secondary | ICD-10-CM | POA: Diagnosis present

## 2014-10-25 DIAGNOSIS — Z9842 Cataract extraction status, left eye: Secondary | ICD-10-CM | POA: Diagnosis not present

## 2014-10-25 DIAGNOSIS — Z9851 Tubal ligation status: Secondary | ICD-10-CM | POA: Diagnosis not present

## 2014-10-25 DIAGNOSIS — J449 Chronic obstructive pulmonary disease, unspecified: Secondary | ICD-10-CM | POA: Diagnosis present

## 2014-10-25 DIAGNOSIS — Z8572 Personal history of non-Hodgkin lymphomas: Secondary | ICD-10-CM | POA: Diagnosis not present

## 2014-10-25 DIAGNOSIS — E11319 Type 2 diabetes mellitus with unspecified diabetic retinopathy without macular edema: Secondary | ICD-10-CM | POA: Diagnosis not present

## 2014-10-25 DIAGNOSIS — J9621 Acute and chronic respiratory failure with hypoxia: Secondary | ICD-10-CM | POA: Diagnosis not present

## 2014-10-25 DIAGNOSIS — I129 Hypertensive chronic kidney disease with stage 1 through stage 4 chronic kidney disease, or unspecified chronic kidney disease: Secondary | ICD-10-CM | POA: Diagnosis present

## 2014-10-25 DIAGNOSIS — Z90710 Acquired absence of both cervix and uterus: Secondary | ICD-10-CM | POA: Diagnosis not present

## 2014-10-25 DIAGNOSIS — I480 Paroxysmal atrial fibrillation: Secondary | ICD-10-CM | POA: Diagnosis present

## 2014-10-25 DIAGNOSIS — Z9049 Acquired absence of other specified parts of digestive tract: Secondary | ICD-10-CM | POA: Diagnosis present

## 2014-10-25 DIAGNOSIS — Z9981 Dependence on supplemental oxygen: Secondary | ICD-10-CM | POA: Diagnosis not present

## 2014-10-25 DIAGNOSIS — M109 Gout, unspecified: Secondary | ICD-10-CM | POA: Diagnosis present

## 2014-10-25 DIAGNOSIS — J9610 Chronic respiratory failure, unspecified whether with hypoxia or hypercapnia: Secondary | ICD-10-CM | POA: Diagnosis present

## 2014-10-25 DIAGNOSIS — E11649 Type 2 diabetes mellitus with hypoglycemia without coma: Secondary | ICD-10-CM | POA: Diagnosis present

## 2014-10-25 DIAGNOSIS — I10 Essential (primary) hypertension: Secondary | ICD-10-CM

## 2014-10-25 DIAGNOSIS — R0602 Shortness of breath: Secondary | ICD-10-CM | POA: Diagnosis not present

## 2014-10-25 DIAGNOSIS — E785 Hyperlipidemia, unspecified: Secondary | ICD-10-CM | POA: Diagnosis present

## 2014-10-25 DIAGNOSIS — E1122 Type 2 diabetes mellitus with diabetic chronic kidney disease: Secondary | ICD-10-CM | POA: Diagnosis present

## 2014-10-25 DIAGNOSIS — Z882 Allergy status to sulfonamides status: Secondary | ICD-10-CM | POA: Diagnosis not present

## 2014-10-25 DIAGNOSIS — D649 Anemia, unspecified: Secondary | ICD-10-CM | POA: Diagnosis not present

## 2014-10-25 DIAGNOSIS — E162 Hypoglycemia, unspecified: Secondary | ICD-10-CM | POA: Diagnosis present

## 2014-10-25 DIAGNOSIS — I481 Persistent atrial fibrillation: Secondary | ICD-10-CM | POA: Diagnosis present

## 2014-10-25 DIAGNOSIS — I255 Ischemic cardiomyopathy: Secondary | ICD-10-CM | POA: Diagnosis present

## 2014-10-25 DIAGNOSIS — Z6841 Body Mass Index (BMI) 40.0 and over, adult: Secondary | ICD-10-CM | POA: Diagnosis not present

## 2014-10-25 LAB — COMPREHENSIVE METABOLIC PANEL
ALBUMIN: 3.7 g/dL (ref 3.5–5.0)
ALT: 12 U/L — AB (ref 14–54)
ANION GAP: 8 (ref 5–15)
AST: 14 U/L — ABNORMAL LOW (ref 15–41)
Alkaline Phosphatase: 36 U/L — ABNORMAL LOW (ref 38–126)
BUN: 70 mg/dL — ABNORMAL HIGH (ref 6–20)
CO2: 25 mmol/L (ref 22–32)
Calcium: 8.5 mg/dL — ABNORMAL LOW (ref 8.9–10.3)
Chloride: 105 mmol/L (ref 101–111)
Creatinine, Ser: 2.98 mg/dL — ABNORMAL HIGH (ref 0.44–1.00)
GFR calc Af Amer: 17 mL/min — ABNORMAL LOW (ref >60)
GFR calc non Af Amer: 15 mL/min — ABNORMAL LOW (ref >60)
Glucose, Bld: 175 mg/dL — ABNORMAL HIGH (ref 65–99)
Potassium: 5.9 mmol/L — ABNORMAL HIGH (ref 3.5–5.1)
SODIUM: 138 mmol/L (ref 135–145)
TOTAL PROTEIN: 7 g/dL (ref 6.5–8.1)
Total Bilirubin: 0.6 mg/dL (ref 0.3–1.2)

## 2014-10-25 LAB — CBC WITH DIFFERENTIAL/PLATELET
BASOS PCT: 0 % (ref 0–1)
Basophils Absolute: 0 10*3/uL (ref 0.0–0.1)
EOS ABS: 0 10*3/uL (ref 0.0–0.7)
EOS PCT: 0 % (ref 0–5)
HEMATOCRIT: 32.6 % — AB (ref 36.0–46.0)
HEMOGLOBIN: 10.3 g/dL — AB (ref 12.0–15.0)
Lymphocytes Relative: 10 % — ABNORMAL LOW (ref 12–46)
Lymphs Abs: 0.8 10*3/uL (ref 0.7–4.0)
MCH: 29.1 pg (ref 26.0–34.0)
MCHC: 31.6 g/dL (ref 30.0–36.0)
MCV: 92.1 fL (ref 78.0–100.0)
MONO ABS: 0.1 10*3/uL (ref 0.1–1.0)
MONOS PCT: 2 % — AB (ref 3–12)
Neutro Abs: 6.8 10*3/uL (ref 1.7–7.7)
Neutrophils Relative %: 88 % — ABNORMAL HIGH (ref 43–77)
Platelets: 196 10*3/uL (ref 150–400)
RBC: 3.54 MIL/uL — AB (ref 3.87–5.11)
RDW: 15.1 % (ref 11.5–15.5)
WBC: 7.8 10*3/uL (ref 4.0–10.5)

## 2014-10-25 LAB — GLUCOSE, CAPILLARY
Glucose-Capillary: 177 mg/dL — ABNORMAL HIGH (ref 65–99)
Glucose-Capillary: 169 mg/dL — ABNORMAL HIGH (ref 65–99)
Glucose-Capillary: 207 mg/dL — ABNORMAL HIGH (ref 65–99)
Glucose-Capillary: 390 mg/dL — ABNORMAL HIGH (ref 65–99)
Glucose-Capillary: 395 mg/dL — ABNORMAL HIGH (ref 65–99)
Glucose-Capillary: 423 mg/dL — ABNORMAL HIGH (ref 65–99)

## 2014-10-25 LAB — I-STAT ARTERIAL BLOOD GAS, ED
BICARBONATE: 26.7 meq/L — AB (ref 20.0–24.0)
O2 Saturation: 89 %
TCO2: 28 mmol/L (ref 0–100)
pCO2 arterial: 53.5 mmHg — ABNORMAL HIGH (ref 35.0–45.0)
pH, Arterial: 7.307 — ABNORMAL LOW (ref 7.350–7.450)
pO2, Arterial: 63 mmHg — ABNORMAL LOW (ref 80.0–100.0)

## 2014-10-25 LAB — PROTIME-INR
INR: 2.61 — ABNORMAL HIGH (ref 0.00–1.49)
PROTHROMBIN TIME: 27.5 s — AB (ref 11.6–15.2)

## 2014-10-25 LAB — INFLUENZA PANEL BY PCR (TYPE A & B)
H1N1 flu by pcr: NOT DETECTED
INFLBPCR: NEGATIVE
Influenza A By PCR: NEGATIVE

## 2014-10-25 LAB — TROPONIN I

## 2014-10-25 LAB — CBG MONITORING, ED: GLUCOSE-CAPILLARY: 91 mg/dL (ref 65–99)

## 2014-10-25 LAB — STREP PNEUMONIAE URINARY ANTIGEN: STREP PNEUMO URINARY ANTIGEN: NEGATIVE

## 2014-10-25 LAB — MRSA PCR SCREENING: MRSA by PCR: NEGATIVE

## 2014-10-25 LAB — POTASSIUM: POTASSIUM: 6 mmol/L — AB (ref 3.5–5.1)

## 2014-10-25 LAB — LACTIC ACID, PLASMA: Lactic Acid, Venous: 0.6 mmol/L (ref 0.5–2.0)

## 2014-10-25 MED ORDER — INSULIN ASPART 100 UNIT/ML ~~LOC~~ SOLN
0.0000 [IU] | Freq: Three times a day (TID) | SUBCUTANEOUS | Status: DC
  Administered 2014-10-25: 3 [IU] via SUBCUTANEOUS
  Administered 2014-10-25 (×2): 9 [IU] via SUBCUTANEOUS

## 2014-10-25 MED ORDER — CARVEDILOL 25 MG PO TABS
25.0000 mg | ORAL_TABLET | Freq: Two times a day (BID) | ORAL | Status: DC
  Administered 2014-10-25 – 2014-10-29 (×9): 25 mg via ORAL
  Filled 2014-10-25 (×11): qty 1

## 2014-10-25 MED ORDER — ACETAMINOPHEN 325 MG PO TABS
650.0000 mg | ORAL_TABLET | ORAL | Status: DC | PRN
  Filled 2014-10-25: qty 2

## 2014-10-25 MED ORDER — INSULIN NPH (HUMAN) (ISOPHANE) 100 UNIT/ML ~~LOC~~ SUSP
15.0000 [IU] | Freq: Every day | SUBCUTANEOUS | Status: DC
  Administered 2014-10-26 (×2): 15 [IU] via SUBCUTANEOUS
  Filled 2014-10-25: qty 10

## 2014-10-25 MED ORDER — LORAZEPAM 0.5 MG PO TABS
0.5000 mg | ORAL_TABLET | Freq: Every day | ORAL | Status: DC | PRN
  Administered 2014-10-26 – 2014-10-28 (×4): 0.5 mg via ORAL
  Filled 2014-10-25 (×4): qty 1

## 2014-10-25 MED ORDER — METHYLPREDNISOLONE SODIUM SUCC 125 MG IJ SOLR
60.0000 mg | Freq: Every day | INTRAMUSCULAR | Status: DC
  Administered 2014-10-25: 60 mg via INTRAVENOUS
  Filled 2014-10-25: qty 2

## 2014-10-25 MED ORDER — WARFARIN - PHARMACIST DOSING INPATIENT: Freq: Every day | Status: DC

## 2014-10-25 MED ORDER — LEVOTHYROXINE SODIUM 25 MCG PO TABS
25.0000 ug | ORAL_TABLET | Freq: Every day | ORAL | Status: DC
  Administered 2014-10-25 – 2014-10-29 (×5): 25 ug via ORAL
  Filled 2014-10-25 (×6): qty 1

## 2014-10-25 MED ORDER — SODIUM CHLORIDE 0.9 % IJ SOLN: 3.0000 mL | INTRAMUSCULAR | Status: DC | PRN

## 2014-10-25 MED ORDER — PIPERACILLIN-TAZOBACTAM 3.375 G IVPB
3.3750 g | Freq: Three times a day (TID) | INTRAVENOUS | Status: DC
  Filled 2014-10-25: qty 50

## 2014-10-25 MED ORDER — FUROSEMIDE 10 MG/ML IJ SOLN
40.0000 mg | Freq: Once | INTRAMUSCULAR | Status: AC
  Administered 2014-10-25: 40 mg via INTRAVENOUS
  Filled 2014-10-25: qty 4

## 2014-10-25 MED ORDER — GABAPENTIN 300 MG PO CAPS
300.0000 mg | ORAL_CAPSULE | Freq: Two times a day (BID) | ORAL | Status: DC
  Administered 2014-10-25 – 2014-10-29 (×9): 300 mg via ORAL
  Filled 2014-10-25 (×10): qty 1

## 2014-10-25 MED ORDER — WARFARIN SODIUM 5 MG PO TABS
5.0000 mg | ORAL_TABLET | ORAL | Status: DC
  Administered 2014-10-26: 5 mg via ORAL
  Filled 2014-10-25: qty 1

## 2014-10-25 MED ORDER — SODIUM CHLORIDE 0.9 % IJ SOLN
3.0000 mL | Freq: Two times a day (BID) | INTRAMUSCULAR | Status: DC
  Administered 2014-10-25 – 2014-10-28 (×8): 3 mL via INTRAVENOUS

## 2014-10-25 MED ORDER — LEVALBUTEROL HCL 0.63 MG/3ML IN NEBU
0.6300 mg | INHALATION_SOLUTION | Freq: Four times a day (QID) | RESPIRATORY_TRACT | Status: DC
  Administered 2014-10-25 (×2): 0.63 mg via RESPIRATORY_TRACT
  Filled 2014-10-25 (×6): qty 3

## 2014-10-25 MED ORDER — ESCITALOPRAM OXALATE 10 MG PO TABS
10.0000 mg | ORAL_TABLET | Freq: Every day | ORAL | Status: DC
  Administered 2014-10-25 – 2014-10-29 (×5): 10 mg via ORAL
  Filled 2014-10-25 (×5): qty 1

## 2014-10-25 MED ORDER — VANCOMYCIN HCL 10 G IV SOLR
1750.0000 mg | INTRAVENOUS | Status: DC
  Filled 2014-10-25: qty 1750

## 2014-10-25 MED ORDER — ATORVASTATIN CALCIUM 20 MG PO TABS
20.0000 mg | ORAL_TABLET | Freq: Every day | ORAL | Status: DC
  Administered 2014-10-25 – 2014-10-29 (×5): 20 mg via ORAL
  Filled 2014-10-25 (×5): qty 1

## 2014-10-25 MED ORDER — SODIUM CHLORIDE 0.9 % IV SOLN: 250.0000 mL | INTRAVENOUS | Status: DC | PRN

## 2014-10-25 MED ORDER — SODIUM POLYSTYRENE SULFONATE 15 GM/60ML PO SUSP
15.0000 g | Freq: Once | ORAL | Status: AC
  Administered 2014-10-25: 15 g via ORAL
  Filled 2014-10-25: qty 60

## 2014-10-25 MED ORDER — WARFARIN SODIUM 7.5 MG PO TABS
7.5000 mg | ORAL_TABLET | ORAL | Status: DC
  Administered 2014-10-25: 7.5 mg via ORAL
  Filled 2014-10-25 (×2): qty 1

## 2014-10-25 MED ORDER — LEVALBUTEROL HCL 0.63 MG/3ML IN NEBU
0.6300 mg | INHALATION_SOLUTION | Freq: Four times a day (QID) | RESPIRATORY_TRACT | Status: DC | PRN
  Administered 2014-10-27: 0.63 mg via RESPIRATORY_TRACT
  Filled 2014-10-25: qty 3

## 2014-10-25 MED ORDER — IPRATROPIUM BROMIDE 0.02 % IN SOLN
0.5000 mg | Freq: Four times a day (QID) | RESPIRATORY_TRACT | Status: DC
  Administered 2014-10-25 (×2): 0.5 mg via RESPIRATORY_TRACT
  Filled 2014-10-25 (×2): qty 2.5

## 2014-10-25 MED ORDER — ONDANSETRON HCL 4 MG/2ML IJ SOLN: 4.0000 mg | Freq: Four times a day (QID) | INTRAMUSCULAR | Status: DC | PRN

## 2014-10-25 NOTE — Progress Notes (Signed)
UR COMPLETED  

## 2014-10-25 NOTE — Progress Notes (Signed)
ANTIBIOTIC CONSULT NOTE - INITIAL  Pharmacy Consult for Vancomycin/Zosyn  Indication: rule out pneumonia  Allergies  Allergen Reactions  . Sulfonamide Derivatives Other (See Comments)    Unknown; "childhood allergy/mother"    Patient Measurements: Height: 5\' 4"  (162.6 cm) Weight: 267 lb 12.8 oz (121.473 kg) IBW/kg (Calculated) : 54.7  Vital Signs: Temp: 98.1 F (36.7 C) (06/12 2145) Temp Source: Oral (06/12 2145) BP: 115/49 mmHg (06/13 0215) Pulse Rate: 70 (06/13 0219)  Labs:  Recent Labs  10/24/14 2232  WBC 9.6  HGB 10.2*  PLT 218  CREATININE 2.97*   Estimated Creatinine Clearance: 22 mL/min (by C-G formula based on Cr of 2.97). No results for input(s): VANCOTROUGH, VANCOPEAK, VANCORANDOM, GENTTROUGH, GENTPEAK, GENTRANDOM, TOBRATROUGH, TOBRAPEAK, TOBRARND, AMIKACINPEAK, AMIKACINTROU, AMIKACIN in the last 72 hours.   Microbiology: Recent Results (from the past 720 hour(s))  Culture, blood (routine x 2)     Status: None   Collection Time: 09/26/14  1:50 AM  Result Value Ref Range Status   Specimen Description BLOOD RIGHT HAND  Final   Special Requests BOTTLES DRAWN AEROBIC AND ANAEROBIC 5CC EACH  Final   Culture   Final    NO GROWTH 5 DAYS Performed at [facility]    Report Status 10/02/2014 FINAL  Final  Culture, blood (routine x 2)     Status: None   Collection Time: 09/26/14  1:56 AM  Result Value Ref Range Status   Specimen Description BLOOD RIGHT FOREARM  Final   Special Requests BOTTLES DRAWN AEROBIC AND ANAEROBIC 4CC EACH  Final   Culture   Final    NO GROWTH 5 DAYS Performed at [facility]    Report Status 10/02/2014 FINAL  Final  MRSA PCR Screening     Status: None   Collection Time: 09/26/14  8:37 AM  Result Value Ref Range Status   MRSA by PCR NEGATIVE NEGATIVE Final    Comment:        The GeneXpert MRSA Assay (FDA approved for NASAL specimens only), is one component of a comprehensive MRSA colonization surveillance  program. It is not intended to diagnose MRSA infection nor to guide or monitor treatment for MRSA infections.     Medical History: Past Medical History  Diagnosis Date  . LBBB (left bundle branch block)   . Carotid stenosis     a. 10/19/2011 carotid duplex - Mild hard plaque bilaterally. Stable 40-59% bilateral ICA stenosis. Carotid US (04/2013):  Bilateral 40-59% ICA.  F/u 1 year  . Dyslipidemia   . Hypertension   . Chronic combined systolic and diastolic CHF (congestive heart failure)     a. EF as low as 25% in 2006;  b. EF 60-65% in 12/2011;  c. 02/2013 Echo: EF 35-40, mod mid-dist antsept HK, Gr 2 DD, mild LVH.  . Back pain, chronic     "just when I walk; mass on 3rd and 4th vertebrae right lower back"  . Cardiomyopathy, ischemic     a. 2012 s/p SJM 3231-40 Uni BiV ICD, ser # [id number].  Marland Kitchen CAD (coronary artery disease)     a. 05/2001 CABG x4: LIMA->LAD, VG->D1, VG->D2, VG->RCA.  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 CKD (chronic kidney disease), stage III   . Biventricular implantable cardiac defibrillator in situ 2007, 2012    a. 2007;  b. 2012 Gen change: SJM 3231-40 Uni BiV ICD, ser # [id number].  Marland Kitchen Hypoxemia requiring supplemental oxygen   .  Candidiasis of vulva and vagina   . Hypothyroidism   . 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 Benay Spice, in remission  . History of pneumonia 12/27/11    "3 times; it's been a long time ago"  . OSA on CPAP   . Gout ~ 08/2011  . PAF (paroxysmal atrial fibrillation)     on Coumadin  . DM (diabetes mellitus), type 2 with complications     insulin dependent, retinopathy, neuropathy  . Mural thrombus of left ventricle     before 2003, while on Coumadin, No h/o CVA  . Morbid obesity with BMI of 45.0-49.9, adult     Ht. 5'4". BMI 47.2  . Vitamin D deficiency 06/09/2012    historical  . Olecranon bursitis of right elbow 12/29/2012  . History of recurrent UTIs   . Diabetes  05/24/2013  . Hypercalcemia 09/26/2013  . Anemia, unspecified 12/13/2013  . Renal insufficiency 03/14/2014    Following with [facility], Dr Loyal Buba  . Medicare annual wellness visit, subsequent 09/12/2014    Medication: Reviewed with patient and updated  Preferred Pharmacy and which med where: 90 day supply/mail order: [place] [facility]: [place]  DM supplies: [place] [facility]   Allergies verified: UTD   Immunization Status: Flu vaccine-- 01/12/14 Tdap-- 01/28/12 PNA-- 03/11/13 (23)  Shingles--08/19/07  MMG-- 06/25/08 with Johnnette Gourd, MD at [facility]    Assessment: 74 y/o F with SOB x 1 wk, CXR with bibasilar opacities, to start broad spectrum anti-biotics for r/o PNA, WBC WNL, noted renal dysfunction.    Goal of Therapy:  Vancomycin trough level 15-20 mcg/ml  Plan:  -Vancomycin 1750 mg IV q48h -Zosyn 3.375G IV q8h to be infused over 4 hours -Trend WBC, temp, renal function  -Drug levels as indicated   Narda Bonds 10/25/2014,2:49 AM

## 2014-10-25 NOTE — Progress Notes (Signed)
CM consulted for Heart Failure Screen.CM spoke with pt in room regarding Heart failure understanding and needs. Pt verbally stated understanding of heart failure. States she has scale @ home which she weighs daily and pays close attention to soduim intake with diet. Pt states she is being followed @  Dr.McLean's office for her heart failure. CM offered and provided CHF booklet for reinforcement .  No other needs identified @ present time.

## 2014-10-25 NOTE — Progress Notes (Signed)
Inpatient Diabetes Program Recommendations  AACE/ADA: New Consensus Statement on Inpatient Glycemic Control (2013)  Target Ranges:  Prepandial:   less than 140 mg/dL      Peak postprandial:   less than 180 mg/dL (1-2 hours)      Critically ill patients:  140 - 180 mg/dL   Results for [name], Destiny [name] (MRN [id number]) as of 10/25/2014 11:21  Ref. Range 10/25/2014 02:13 10/25/2014 04:23 10/25/2014 05:46 10/25/2014 08:39  Glucose-Capillary Latest Ref Range: 65-99 mg/dL 91 177 (H) 169 (H) 207 (H)   Reason for Visit: SOB  Diabetes history: DM 2 Outpatient Diabetes medications: NPH 45 units QAM, 15 units QPM, Novolin R 15 units TID Current orders for Inpatient glycemic control: Novolog Sensitive scale (0-9 units) TID  Inpatient Diabetes Program Recommendations Insulin - Basal: Please consider ordering a portion of te patient's NPH from home. Correction - Insulin: Solumedrol 60 mg Daily, glucose has increased into the 200 range. Please consider increasing correction scale to Novolog Moderate (0-15 units ) TID.   Thanks,  Tama Headings RN, MSN, [facility] Inpatient Diabetes Coordinator Team Pager [phone]

## 2014-10-25 NOTE — Progress Notes (Signed)
ANTICOAGULATION CONSULT NOTE - Initial Consult  Pharmacy Consult for Warfarin  Indication: atrial fibrillation  Allergies  Allergen Reactions  . Sulfonamide Derivatives Other (See Comments)    Unknown; "childhood allergy/mother"    Patient Measurements: Height: 5\' 4"  (162.6 cm) Weight: 267 lb 12.8 oz (121.473 kg) IBW/kg (Calculated) : 54.7  Vital Signs: Temp: 98.1 F (36.7 C) (06/12 2145) Temp Source: Oral (06/12 2145) BP: 115/49 mmHg (06/13 0215) Pulse Rate: 70 (06/13 0219)  Labs:  Recent Labs  10/24/14 2232 10/24/14 2233  HGB 10.2*  --   HCT 31.9*  --   PLT 218  --   LABPROT  --  29.5*  INR  --  2.86*  CREATININE 2.97*  --   TROPONINI <0.03  --     Estimated Creatinine Clearance: 22 mL/min (by C-G formula based on Cr of 2.97).   Medical History: Past Medical History  Diagnosis Date  . LBBB (left bundle branch block)   . Carotid stenosis     a. 10/19/2011 carotid duplex - Mild hard plaque bilaterally. Stable 40-59% bilateral ICA stenosis. Carotid US (04/2013):  Bilateral 40-59% ICA.  F/u 1 year  . Dyslipidemia   . Hypertension   . Chronic combined systolic and diastolic CHF (congestive heart failure)     a. EF as low as 25% in 2006;  b. EF 60-65% in 12/2011;  c. 02/2013 Echo: EF 35-40, mod mid-dist antsept HK, Gr 2 DD, mild LVH.  . Back pain, chronic     "just when I walk; mass on 3rd and 4th vertebrae right lower back"  . Cardiomyopathy, ischemic     a. 2012 s/p SJM 3231-40 Uni BiV ICD, ser # [id number].  Marland Kitchen CAD (coronary artery disease)     a. 05/2001 CABG x4: LIMA->LAD, VG->D1, VG->D2, VG->RCA.  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 CKD (chronic kidney disease), stage III   . Biventricular implantable cardiac defibrillator in situ 2007, 2012    a. 2007;  b. 2012 Gen change: SJM 3231-40 Uni BiV ICD, ser # [id number].  Marland Kitchen Hypoxemia requiring supplemental oxygen   . Candidiasis of vulva and vagina   . Hypothyroidism   . 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 Benay Spice, in remission  . History of pneumonia 12/27/11    "3 times; it's been a long time ago"  . OSA on CPAP   . Gout ~ 08/2011  . PAF (paroxysmal atrial fibrillation)     on Coumadin  . DM (diabetes mellitus), type 2 with complications     insulin dependent, retinopathy, neuropathy  . Mural thrombus of left ventricle     before 2003, while on Coumadin, No h/o CVA  . Morbid obesity with BMI of 45.0-49.9, adult     Ht. 5'4". BMI 47.2  . Vitamin D deficiency 06/09/2012    historical  . Olecranon bursitis of right elbow 12/29/2012  . History of recurrent UTIs   . Diabetes 05/24/2013  . Hypercalcemia 09/26/2013  . Anemia, unspecified 12/13/2013  . Renal insufficiency 03/14/2014    Following with [facility], Dr Loyal Buba  . Medicare annual wellness visit, subsequent 09/12/2014    Medication: Reviewed with patient and updated  Preferred Pharmacy and which med where: 90 day supply/mail order: [place] pharmacy: [facility]  DM supplies: [facility]   Allergies verified: UTD   Immunization Status: Flu vaccine-- 01/12/14 Tdap-- 01/28/12 PNA--  03/11/13 (23)  Shingles--08/19/07  MMG-- 06/25/08 with Johnnette Gourd, MD at [place]: 74 y/o F here with SOB, on warfarin PTA for afib, INR on admit is good at 2.86, Hgb 10.2, noted renal dysfunction.   Goal of Therapy:  INR 2-3 Monitor platelets by anticoagulation protocol: Yes   Plan:  -Resume home dose warfarin: 7.5 mg on Mon/Wed/Fri, 5 mg all other days -Daily PT/INR, adjust dose as needed -Monitor for bleeding  Narda Bonds 10/25/2014,3:44 AM

## 2014-10-25 NOTE — Care Management Note (Signed)
Case Management Note  Patient Details  Name: JINNIE ONLEY MRN: [id number] Date of Birth: August 21, 1940  Subjective/Objective:               Pt from home with husband admitted with sob. Past medical history with chronic combined systolic and diastolic heart failure ejection fraction 25-30% echo 6/16, PA-C implant, coronary artery disease, hypertension, carotid artery disease, chronic kidney disease, diabetes type 2 and chronic kidney disease, obstructive sleep apnea on cpap, hypothyroidism, NHL, anemia. Home 02 @ 2L.  Action/Plan: Return to home when medically stable. CM to f/u with d/c needs.  Expected Discharge Date:                  Expected Discharge Plan:  [place]  In-House Referral:     Discharge planning Services  CM Consult  Post Acute Care Choice:    Choice offered to:     DME Arranged:    DME Agency:     HH Arranged:    [place] Agency:     Status of Service:  In process, will continue to follow  Medicare Important Message Given:    Date Medicare IM Given:    Medicare IM give by:    Date Additional Medicare IM Given:    Additional Medicare Important Message give by:     If discussed at [place] of Stay Meetings, dates discussed:    Additional Comments: DolonShaver (husband) [phone] Sharin Mons, RN 10/25/2014, 10:25 AM -

## 2014-10-25 NOTE — H&P (Signed)
[facility] History and Physical  Patient: Destiny [name]  MRN: [id number]  DOB: October 19, 1940  DOS: the patient was seen and examined on 10/25/2014 PCP: Penni Homans, MD  Referring physician: [facility]. Chief Complaint: Shortness of breath  HPI: Destiny [name] is a 74 y.o. female with Past medical history of chronic combined systolic and diastolic heart failure ejection fraction 25-30% echo 6/16, PA-C implant, coronary artery disease, hypertension, carotid artery disease, chronic kidney disease, diabetes type 2 with her time of fatigue and chronic kidney disease, obstructive sleep apnea on C Pap, hypothyroidism, history of NHL, anemia. The patient presents with complaints of shortness of breath that is ongoing since her recent PFT. Patient underwent a PFT on 10-15-14 and was started on amiodarone on 10-14-14. Patient mentions since then she has been having progressively worsening shortness of breath. She mentions she has tried taking extra dose of torsemide 3 days ago for 3 days but has not improved. She has gained 5 pounds since her last hospitalization as per the patient and 7 pounds as per documentation. She denies any chest pain chest heaviness she denies any fever or chills. She complains of cough with expectoration which is chronic. She has chronic orthopnea but denies any PND. She is compliant with her seatbelt. No nausea no vomiting no abdominal pain no diarrhea no constipation no burning urination and no active bleeding. She uses 2 L of oxygen at her baseline and her husband has increased her to 5 liters of oxygen.  The patient is coming from home. And at her baseline independent for most of her ADL.  Review of Systems: as mentioned in the history of present illness.  A comprehensive review of the other systems is negative.  Past Medical History  Diagnosis Date  . LBBB (left bundle branch block)   . Carotid stenosis     a. 10/19/2011 carotid duplex - Mild hard plaque  bilaterally. Stable 40-59% bilateral ICA stenosis. Carotid US (04/2013):  Bilateral 40-59% ICA.  F/u 1 year  . Dyslipidemia   . Hypertension   . Chronic combined systolic and diastolic CHF (congestive heart failure)     a. EF as low as 25% in 2006;  b. EF 60-65% in 12/2011;  c. 02/2013 Echo: EF 35-40, mod mid-dist antsept HK, Gr 2 DD, mild LVH.  . Back pain, chronic     "just when I walk; mass on 3rd and 4th vertebrae right lower back"  . Cardiomyopathy, ischemic     a. 2012 s/p SJM 3231-40 Uni BiV ICD, ser # [id number].  Marland Kitchen CAD (coronary artery disease)     a. 05/2001 CABG x4: LIMA->LAD, VG->D1, VG->D2, VG->RCA.  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 CKD (chronic kidney disease), stage III   . Biventricular implantable cardiac defibrillator in situ 2007, 2012    a. 2007;  b. 2012 Gen change: SJM 3231-40 Uni BiV ICD, ser # [id number].  Marland Kitchen Hypoxemia requiring supplemental oxygen   . Candidiasis of vulva and vagina   . Hypothyroidism   . 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 Benay Spice, in remission  . History of pneumonia 12/27/11    "3 times; it's been a long time ago"  . OSA on CPAP   . Gout ~ 08/2011  . PAF (paroxysmal atrial fibrillation)     on Coumadin  . DM (diabetes mellitus), type 2 with complications     insulin dependent,  retinopathy, neuropathy  . Mural thrombus of left ventricle     before 2003, while on Coumadin, No h/o CVA  . Morbid obesity with BMI of 45.0-49.9, adult     Ht. 5'4". BMI 47.2  . Vitamin D deficiency 06/09/2012    historical  . Olecranon bursitis of right elbow 12/29/2012  . History of recurrent UTIs   . Diabetes 05/24/2013  . Hypercalcemia 09/26/2013  . Anemia, unspecified 12/13/2013  . Renal insufficiency 03/14/2014    Following with [facility], Dr Loyal Buba  . Medicare annual wellness visit, subsequent 09/12/2014    Medication: Reviewed with patient and updated  Preferred Pharmacy and which  med where: 90 day supply/mail order: [place] pharmacy: [place]  DM supplies: [facility]   Allergies verified: UTD   Immunization Status: Flu vaccine-- 01/12/14 Tdap-- 01/28/12 PNA-- 03/11/13 (23)  Shingles--08/19/07  MMG-- 06/25/08 with Johnnette Gourd, MD at [facility]   Past Surgical History  Procedure Laterality Date  . Cardiac catheterization  06/03/01  . Cholecystectomy  1970  . Tubal ligation  1972  . Abdominal hysterectomy  1982  . Insert / replace / remove pacemaker  2007; 2012    w/AICD  . Coronary artery bypass graft  2003    CABG X4  . Cataract extraction      left eye  . Cardioversion N/A 07/30/2014    Procedure: CARDIOVERSION;  Surgeon: Jolaine Artist, MD;  Location: [facility] ENDOSCOPY;  Service: Cardiovascular;  Laterality: N/A;  . Refractive surgery      right eye   Social History:  reports that she has never smoked. She has never used smokeless tobacco. She reports that she does not drink alcohol or use illicit drugs.  Allergies  Allergen Reactions  . Sulfonamide Derivatives Other (See Comments)    Unknown; "childhood allergy/mother"    Family History  Problem Relation Age of Onset  . Kidney cancer Mother     kidney and female repo - died @ 48  . Asthma Mother   . Cancer Mother     gyn and renal  . Heart disease Mother     CAD  . Heart disease Father     died @ 8  . Stroke Father   . Diabetes Father   . Hypertension Father   . Hyperlipidemia Father   . Hypertension Son   . Hypertension Maternal Aunt   . Kidney cancer Maternal Uncle   . Cancer Maternal Uncle   . Cirrhosis Maternal Grandmother     non alcohol  . Cancer Maternal Grandfather   . Kidney cancer Maternal Grandfather     Prior to Admission medications   Medication Sig Start Date End Date Taking? Authorizing Provider  acetaminophen (TYLENOL) 500 MG tablet Take 1,000 mg by mouth every 6 (six) hours as needed (pain).   Yes Historical  Provider, MD  albuterol (PROVENTIL HFA;VENTOLIN HFA) 108 (90 BASE) MCG/ACT inhaler Inhale 2 puffs into the lungs every 6 (six) hours as needed for wheezing or shortness of breath. 12/26/12  Yes Mosie Lukes, MD  amiodarone (PACERONE) 200 MG tablet Take 1 tablet (200 mg total) by mouth 2 (two) times daily. 10/14/14  Yes Evans Lance, MD  atorvastatin (LIPITOR) 20 MG tablet Take 1 tablet (20 mg total) by mouth daily. 08/31/14  Yes Mosie Lukes, MD  carvedilol (COREG) 25 MG tablet Take 1 tablet by mouth 2 (two) times daily. 07/27/14  Yes Historical Provider, MD  enalapril (VASOTEC)  2.5 MG tablet TAKE 1 TABLET TWICE DAILY. 04/26/14  Yes Shaune Pascal Bensimhon, MD  escitalopram (LEXAPRO) 10 MG tablet Take 1 tablet (10 mg total) by mouth daily. 08/31/14  Yes Mosie Lukes, MD  fenofibrate (TRICOR) 48 MG tablet Take 1 tablet (48 mg total) by mouth daily. 07/15/14  Yes Larey Dresser, MD  fluticasone [facility]) 50 MCG/ACT nasal spray Place 2 sprays into both nostrils daily as needed for allergies or rhinitis. As needed for nasal stuffiness. 01/13/14  Yes Mosie Lukes, MD  gabapentin (NEURONTIN) 300 MG capsule Take 1 capsule (300 mg total) by mouth 2 (two) times daily. 08/31/14  Yes Mosie Lukes, MD  glucose blood (FREESTYLE LITE) test strip DX: 250.60  Check sugars bid and as needed 05/22/13  Yes Mosie Lukes, MD  insulin NPH (HUMULIN N,NOVOLIN N) 100 UNIT/ML injection Inject 20-45 Units into the skin 2 (two) times daily before a meal. Take 45 units in the morning and 15 units in the evening   Yes Historical Provider, MD  insulin regular (NOVOLIN R,HUMULIN R) 100 units/mL injection Change to 15 units three times a with meals (to cover all meals--this is the same total amount but spread out) Patient taking differently: Inject 15 Units into the skin 2 (two) times daily before a meal. Change to 15 units three times a with meals (to cover all meals--this is the same total amount but spread out) 04/01/13  Yes Ivan Anchors  Love, PA-C  levothyroxine (SYNTHROID, LEVOTHROID) 25 MCG tablet TAKE 1 TABLET EVERY DAY BEFORE BREAKFAST 08/31/14  Yes Mosie Lukes, MD  LORazepam (ATIVAN) 0.5 MG tablet Take 1 tablet (0.5 mg total) by mouth at bedtime as needed for anxiety. 08/31/14  Yes Mosie Lukes, MD  NON FORMULARY Oxygen---24 hour   Yes Historical Provider, MD  torsemide (DEMADEX) 20 MG tablet Take 2 tablets (40 mg total) by mouth once. may take an extra tab for 3# weight gain, swelling or SOB Patient taking differently: Take 20 mg by mouth 2 (two) times daily. may take an extra tab for 3# weight gain, swelling or SOB Takes one in the morning and one at 3:30 07/15/14  Yes Larey Dresser, MD  Vitamin D, Ergocalciferol, (DRISDOL) 50000 UNITS CAPS capsule Take 50,000 Units by mouth every 7 (seven) days. Tuesdays   Yes Historical Provider, MD  warfarin (COUMADIN) 5 MG tablet Take 5-7.5 mg by mouth daily at 6 PM. 7.9m on Monday Wednesday and Friday. 533mall other days   Yes Historical Provider, MD    Physical Exam: Filed Vitals:   10/25/14 0149 10/25/14 0200 10/25/14 0215 10/25/14 0219  BP: 100/59 119/67 115/49   Pulse: 83 78 58 70  Temp:      TempSrc:      Resp: 22     Height:      Weight:      SpO2: 95% 93% 98% 91%    General: Alert, Awake and Oriented to Time, Place and Person. Appear in marked distress Eyes: PERRL ENT: Oral Mucosa clear moist. Neck: Difficult to assess JVD Cardiovascular: S1 and S2 Present, no Murmur, Peripheral Pulses Present Respiratory: Bilateral Air entry equal and Decreased,  Basal Crackles, expiratory wheezes Wheezes resolved on reexamination 3 hours later  Abdomen: Bowel Sound present, Soft and non tender Skin: no Rash Extremities: Bilateral Pedal edema, no calf tenderness Neurologic: Grossly no focal neuro deficit.  Labs on Admission:  CBC:  Recent Labs Lab 10/24/14 2232  WBC 9.6  NEUTROABS 7.0  HGB 10.2*  HCT 31.9*  MCV 91.4  PLT 218    CMP     Component Value  Date/Time   NA 141 10/24/2014 2232   K 5.0 10/24/2014 2232   CL 105 10/24/2014 2232   CO2 25 10/24/2014 2232   GLUCOSE 50* 10/24/2014 2232   GLUCOSE 276* 04/22/2006 0900   BUN 71* 10/24/2014 2232   CREATININE 2.97* 10/24/2014 2232   CREATININE 2.23* 12/26/2011 1236   CALCIUM 8.9 10/24/2014 2232   PROT 7.2 10/24/2014 2233   ALBUMIN 3.5 10/24/2014 2233   AST 14* 10/24/2014 2233   ALT 12* 10/24/2014 2233   ALKPHOS 37* 10/24/2014 2233   BILITOT 0.6 10/24/2014 2233   GFRNONAA 15* 10/24/2014 2232   GFRAA 17* 10/24/2014 2232    No results for input(s): LIPASE, AMYLASE in the last 168 hours.   Recent Labs Lab 10/24/14 2232  TROPONINI <0.03   BNP (last 3 results)  Recent Labs  07/28/14 2048 09/25/14 2159 10/24/14 2233  BNP 353.9* 237.5* 215.2*    ProBNP (last 3 results) No results for input(s): PROBNP in the last 8760 hours.   Radiological Exams on Admission: Dg Chest Portable 1 View  10/24/2014   CLINICAL DATA:  Acute onset of shortness of breath. Initial encounter.  EXAM: PORTABLE CHEST - 1 VIEW  COMPARISON:  Chest radiograph performed 10/05/2014  FINDINGS: The lungs are well-aerated. Vascular congestion is noted. Increased interstitial markings and bibasilar airspace opacities raise concern for mild pulmonary edema. There is no evidence of pleural effusion or pneumothorax.  The cardiomediastinal silhouette is mildly enlarged. The patient is status post median sternotomy. A pacemaker/AICD is noted overlying the left chest wall, with leads ending overlying the right atrium, right ventricle and coronary sinus. No acute osseous abnormalities are seen.  IMPRESSION: Vascular congestion and mild cardiomegaly. Increased interstitial markings and bibasilar airspace opacities raise concern for mild pulmonary edema.   Electronically Signed   By: Garald Balding M.D.   On: 10/24/2014 22:32   EKG: Independently reviewed. atrial fibrillation, rate controlled.  Assessment/Plan Principal  Problem:   Acute and chronic respiratory failure (acute-on-chronic) Active Problems:   NHL (non-Hodgkin's lymphoma)   Diabetes mellitus type 2 with retinopathy   Essential hypertension   ATRIAL FIBRILLATION, PAROXYSMAL   Automatic implantable cardioverter-defibrillator in situ   OSA (obstructive sleep apnea)   Hypothyroidism   Anemia   Type 2 diabetes mellitus, controlled, with renal complications   Morbid obesity   Acute on chronic combined systolic and diastolic congestive heart failure   Hypoglycemia   1. Acute and chronic respiratory failure (acute-on-chronic) The patient is presenting with complaints of shortness of breath. Her chest x-ray shows increased interstitial markings suggesting possible atypical pneumonia versus interstitial inflammation versus CHF. She is hypoxic more than her baseline and also has developed hypercarbia which is also worse than her baseline. At present potential differential included amiodarone-induced toxicity, chronic interstitial lung disease, acute on chronic CHF, acute atypical pneumonia. With this differential the patient at present does not have any evidence of infection clinically with fever or chills or expectorate sputum or leukocytosis-therefore I would hold off on antibiotics.  I will check ESR, CRP and hold amiodarone. I would use DuoNeb's, Solu-Medrol, flutter device. Check influenza PCR, sputum culture, culture, urine antigens. Continue CPAP at home setting, when necessary BiPAP although the patient adamantly refused to be on BiPAP. I will give her 40 mg IV Lasix and monitor ins and outs. Check serial troponin.  2.  Acute on chronic combined CHF. Management as above. Continue Coreg. Patient has improved with IV Lasix and therefore I would continue with IV diuresis. Monitor BMP regularly closely. Cardiology consult in the morning.  3. Obstructive sleep apnea on C Pap. Continuing home C Pap.  4. Chronic kidney disease. With  diuresis need to closely monitor her renal function. Holding ACE inhibitor in the setting of worsening renal function.  5. Hypothyroidism. Continuing Synthroid.  6. Hypoglycemia, history of diabetes mellitus. The patient's explanation is she has reduced her diet secondary to weight gain due to CHF which could explain her hypoglycemia. She only checks her sugars once a day. She was recommended to monitor sugar more frequently. Currently holding her insulin and placing her on sliding scale sensitive.  7. Chronic anticoagulation, A. fib. Patient is back again in A. fib. With possibility of pulmonary toxicity from amiodarone would hold off on amiodarone. Continue warfarin.  Advance goals of care discussion: Full code as per my discussion with patient.  DVT Prophylaxis: on chronic therapeutic anticoagulation. Nutrition: Cardiac and diabetic diet  Family Communication: family was present at bedside, opportunity was given to ask question and all questions were answered satisfactorily at the time of interview. Disposition: Admitted as inpatient, step-down unit.  Author: Berle Mull, MD [facility] Pager: [phone] 10/25/2014  If 7PM-7AM, please contact night-coverage [email or web address] Password [email or web address]

## 2014-10-25 NOTE — Progress Notes (Signed)
[facility] PROGRESS NOTE   Destiny [name] SNK:[id number] DOB: Sep 28, 1940 DOA: 10/24/2014 PCP: Penni Homans, MD  HPI/Subjective: Seen with husband at bedside, sitting in chair at bedside. On 3 L of oxygen sats in the mid 90s.  Assessment/Plan: Principal Problem:   Acute and chronic respiratory failure (acute-on-chronic) Active Problems:   NHL (non-Hodgkin's lymphoma)   Diabetes mellitus type 2 with retinopathy   Essential hypertension   ATRIAL FIBRILLATION, PAROXYSMAL   Automatic implantable cardioverter-defibrillator in situ   OSA (obstructive sleep apnea)   Hypothyroidism   Anemia   Type 2 diabetes mellitus, controlled, with renal complications   Morbid obesity   Acute on chronic combined systolic and diastolic congestive heart failure   Hypoglycemia   This is a no charge note, patient seen earlier today by my colleague Dr. Posey Pronto. Patient admitted to the hospital with acute on chronic respiratory failure, feels much better. Felt her breathing is worsening after starting amiodarone. Amiodarone on hold. BNP is marginal elevated, no impressive lower limb edema or change in x-ray.  Code Status: Full Code Family Communication: Plan discussed with the patient. Disposition Plan: Remains inpatient Diet: Diet heart healthy/carb modified Room service appropriate?: Yes; Fluid consistency:: Thin  Consultants:  None  Procedures:  None  Antibiotics:  None   Objective: Filed Vitals:   10/25/14 0753  BP: 129/63  Pulse: 110  Temp: 97.9 F (36.6 C)  Resp: 23    Intake/Output Summary (Last 24 hours) at 10/25/14 1218 Last data filed at 10/25/14 1031  Gross per 24 hour  Intake    120 ml  Output    551 ml  Net   -431 ml   Filed Weights   10/25/14 0142  Weight: 121.473 kg (267 lb 12.8 oz)    Exam: General: Alert and awake, oriented x3, not in any acute distress. HEENT: anicteric sclera, pupils reactive to light and accommodation, EOMI CVS: S1-S2 clear,  no murmur rubs or gallops Chest: clear to auscultation bilaterally, no wheezing, rales or rhonchi Abdomen: soft nontender, nondistended, normal bowel sounds, no organomegaly Extremities: no cyanosis, clubbing or edema noted bilaterally Neuro: Cranial nerves II-XII intact, no focal neurological deficits  Data Reviewed: Basic Metabolic Panel:  Recent Labs Lab 10/20/14 1243 10/24/14 2232 10/25/14 0453 10/25/14 0904  NA 141 141 138  --   K 4.9 5.0 5.9* 6.0*  CL 105 105 105  --   CO2 27 25 25   --   GLUCOSE 52* 50* 175*  --   BUN 74* 71* 70*  --   CREATININE 2.91* 2.97* 2.98*  --   CALCIUM 9.3 8.9 8.5*  --    Liver Function Tests:  Recent Labs Lab 10/24/14 2233 10/25/14 0453  AST 14* 14*  ALT 12* 12*  ALKPHOS 37* 36*  BILITOT 0.6 0.6  PROT 7.2 7.0  ALBUMIN 3.5 3.7   No results for input(s): LIPASE, AMYLASE in the last 168 hours. No results for input(s): AMMONIA in the last 168 hours. CBC:  Recent Labs Lab 10/24/14 2232 10/25/14 0453  WBC 9.6 7.8  NEUTROABS 7.0 6.8  HGB 10.2* 10.3*  HCT 31.9* 32.6*  MCV 91.4 92.1  PLT 218 196   Cardiac Enzymes:  Recent Labs Lab 10/24/14 2232 10/25/14 0453  TROPONINI <0.03 <0.03   BNP (last 3 results)  Recent Labs  07/28/14 2048 09/25/14 2159 10/24/14 2233  BNP 353.9* 237.5* 215.2*    ProBNP (last 3 results) No results for input(s): PROBNP in the last 8760 hours.  CBG:  Recent Labs Lab 10/25/14 0213 10/25/14 0423 10/25/14 0546 10/25/14 0839  GLUCAP 91 177* 169* 207*    Micro Recent Results (from the past 240 hour(s))  MRSA PCR Screening     Status: None   Collection Time: 10/25/14  3:19 AM  Result Value Ref Range Status   MRSA by PCR NEGATIVE NEGATIVE Final    Comment:        The GeneXpert MRSA Assay (FDA approved for NASAL specimens only), is one component of a comprehensive MRSA colonization surveillance program. It is not intended to diagnose MRSA infection nor to guide or monitor treatment  for MRSA infections.      Studies: Dg Chest Portable 1 View  10/24/2014   CLINICAL DATA:  Acute onset of shortness of breath. Initial encounter.  EXAM: PORTABLE CHEST - 1 VIEW  COMPARISON:  Chest radiograph performed 10/05/2014  FINDINGS: The lungs are well-aerated. Vascular congestion is noted. Increased interstitial markings and bibasilar airspace opacities raise concern for mild pulmonary edema. There is no evidence of pleural effusion or pneumothorax.  The cardiomediastinal silhouette is mildly enlarged. The patient is status post median sternotomy. A pacemaker/AICD is noted overlying the left chest wall, with leads ending overlying the right atrium, right ventricle and coronary sinus. No acute osseous abnormalities are seen.  IMPRESSION: Vascular congestion and mild cardiomegaly. Increased interstitial markings and bibasilar airspace opacities raise concern for mild pulmonary edema.   Electronically Signed   By: Garald Balding M.D.   On: 10/24/2014 22:32    Scheduled Meds: . atorvastatin  20 mg Oral Daily  . carvedilol  25 mg Oral BID WC  . escitalopram  10 mg Oral Daily  . gabapentin  300 mg Oral BID  . insulin aspart  0-9 Units Subcutaneous TID WC  . ipratropium  0.5 mg Nebulization Q6H  . levalbuterol  0.63 mg Nebulization Q6H  . levothyroxine  25 mcg Oral QAC breakfast  . methylPREDNISolone (SOLU-MEDROL) injection  60 mg Intravenous Daily  . sodium chloride  3 mL Intravenous Q12H  . [START ON 10/26/2014] warfarin  5 mg Oral Q T,Th,S,Su-1800  . warfarin  7.5 mg Oral Q M,W,F-1800  . Warfarin - Pharmacist Dosing Inpatient   Does not apply q1800   Continuous Infusions:      Time spent: 35 minutes    [facility] A  [facility] Pager [phone] If 7PM-7AM, please contact night-coverage at [email or web address], password [facility] 10/25/2014, 12:18 PM  LOS: 0 days             ]

## 2014-10-25 NOTE — Progress Notes (Signed)
[place] for Warfarin  Indication: atrial fibrillation  Allergies  Allergen Reactions  . Sulfonamide Derivatives Other (See Comments)    Unknown; "childhood allergy/mother"    Patient Measurements: Height: 5\' 4"  (162.6 cm) Weight: 267 lb 12.8 oz (121.473 kg) IBW/kg (Calculated) : 54.7  Vital Signs: Temp: 97.9 F (36.6 C) (06/13 0753) Temp Source: Oral (06/13 0753) BP: 129/63 mmHg (06/13 0753) Pulse Rate: 110 (06/13 0753)  Labs:  Recent Labs  10/24/14 2232 10/24/14 2233 10/25/14 0453  HGB 10.2*  --  10.3*  HCT 31.9*  --  32.6*  PLT 218  --  196  LABPROT  --  29.5* 27.5*  INR  --  2.86* 2.61*  CREATININE 2.97*  --  2.98*  TROPONINI <0.03  --  <0.03    Estimated Creatinine Clearance: 21.9 mL/min (by C-G formula based on Cr of 2.98).   Medical History: Past Medical History  Diagnosis Date  . LBBB (left bundle branch block)   . Carotid stenosis     a. 10/19/2011 carotid duplex - Mild hard plaque bilaterally. Stable 40-59% bilateral ICA stenosis. Carotid US (04/2013):  Bilateral 40-59% ICA.  F/u 1 year  . Dyslipidemia   . Hypertension   . Chronic combined systolic and diastolic CHF (congestive heart failure)     a. EF as low as 25% in 2006;  b. EF 60-65% in 12/2011;  c. 02/2013 Echo: EF 35-40, mod mid-dist antsept HK, Gr 2 DD, mild LVH.  . Back pain, chronic     "just when I walk; mass on 3rd and 4th vertebrae right lower back"  . Cardiomyopathy, ischemic     a. 2012 s/p SJM 3231-40 Uni BiV ICD, ser # [id number].  Marland Kitchen CAD (coronary artery disease)     a. 05/2001 CABG x4: LIMA->LAD, VG->D1, VG->D2, VG->RCA.  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 CKD (chronic kidney disease), stage III   . Biventricular implantable cardiac defibrillator in situ 2007, 2012    a. 2007;  b. 2012 Gen change: SJM 3231-40 Uni BiV ICD, ser # [id number].  Marland Kitchen Hypoxemia requiring supplemental oxygen   . Candidiasis of vulva and vagina    . Hypothyroidism   . 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 Benay Spice, in remission  . History of pneumonia 12/27/11    "3 times; it's been a long time ago"  . OSA on CPAP   . Gout ~ 08/2011  . PAF (paroxysmal atrial fibrillation)     on Coumadin  . DM (diabetes mellitus), type 2 with complications     insulin dependent, retinopathy, neuropathy  . Mural thrombus of left ventricle     before 2003, while on Coumadin, No h/o CVA  . Morbid obesity with BMI of 45.0-49.9, adult     Ht. 5'4". BMI 47.2  . Vitamin D deficiency 06/09/2012    historical  . Olecranon bursitis of right elbow 12/29/2012  . History of recurrent UTIs   . Diabetes 05/24/2013  . Hypercalcemia 09/26/2013  . Anemia, unspecified 12/13/2013  . Renal insufficiency 03/14/2014    Following with [facility], Dr Loyal Buba  . Medicare annual wellness visit, subsequent 09/12/2014    Medication: Reviewed with patient and updated  Preferred Pharmacy and which med where: 90 day supply/mail order: [place] [facility]: [facility]  DM supplies: [facility]   Allergies verified: UTD   Immunization Status:  Flu vaccine-- 01/12/14 Tdap-- 01/28/12 PNA-- 03/11/13 (23)  Shingles--08/19/07  MMG-- 06/25/08 with Johnnette Gourd, MD at [place]: 74 y/o F here with SOB, on warfarin PTA for afib INR still therapeutic  K = 6  Goal of Therapy:  INR 2-3 Monitor platelets by anticoagulation protocol: Yes   Plan:  -Resume home dose warfarin: 7.5 mg on Mon/Wed/Fri, 5 mg all other days -Daily PT/INR, adjust dose as needed -Monitor for bleeding  Thank you. Anette Guarneri, PharmD [phone]  Tad Moore 10/25/2014,10:55 AM

## 2014-10-26 DIAGNOSIS — E1129 Type 2 diabetes mellitus with other diabetic kidney complication: Secondary | ICD-10-CM

## 2014-10-26 DIAGNOSIS — N058 Unspecified nephritic syndrome with other morphologic changes: Secondary | ICD-10-CM

## 2014-10-26 DIAGNOSIS — D649 Anemia, unspecified: Secondary | ICD-10-CM

## 2014-10-26 DIAGNOSIS — E038 Other specified hypothyroidism: Secondary | ICD-10-CM

## 2014-10-26 DIAGNOSIS — C8590 Non-Hodgkin lymphoma, unspecified, unspecified site: Secondary | ICD-10-CM

## 2014-10-26 LAB — BASIC METABOLIC PANEL
Anion gap: 9 (ref 5–15)
BUN: 74 mg/dL — AB (ref 6–20)
CALCIUM: 8.4 mg/dL — AB (ref 8.9–10.3)
CHLORIDE: 103 mmol/L (ref 101–111)
CO2: 24 mmol/L (ref 22–32)
Creatinine, Ser: 2.93 mg/dL — ABNORMAL HIGH (ref 0.44–1.00)
GFR calc Af Amer: 17 mL/min — ABNORMAL LOW (ref >60)
GFR calc non Af Amer: 15 mL/min — ABNORMAL LOW (ref >60)
Glucose, Bld: 324 mg/dL — ABNORMAL HIGH (ref 65–99)
Potassium: 5.2 mmol/L — ABNORMAL HIGH (ref 3.5–5.1)
Sodium: 136 mmol/L (ref 135–145)

## 2014-10-26 LAB — GLUCOSE, CAPILLARY
GLUCOSE-CAPILLARY: 173 mg/dL — AB (ref 65–99)
Glucose-Capillary: 205 mg/dL — ABNORMAL HIGH (ref 65–99)
Glucose-Capillary: 213 mg/dL — ABNORMAL HIGH (ref 65–99)
Glucose-Capillary: 215 mg/dL — ABNORMAL HIGH (ref 65–99)

## 2014-10-26 LAB — PROTIME-INR
INR: 3.17 — ABNORMAL HIGH (ref 0.00–1.49)
Prothrombin Time: 31.9 seconds — ABNORMAL HIGH (ref 11.6–15.2)

## 2014-10-26 LAB — CBC
HCT: 29 % — ABNORMAL LOW (ref 36.0–46.0)
Hemoglobin: 9.3 g/dL — ABNORMAL LOW (ref 12.0–15.0)
MCH: 29 pg (ref 26.0–34.0)
MCHC: 32.1 g/dL (ref 30.0–36.0)
MCV: 90.3 fL (ref 78.0–100.0)
PLATELETS: 192 10*3/uL (ref 150–400)
RBC: 3.21 MIL/uL — ABNORMAL LOW (ref 3.87–5.11)
RDW: 14.9 % (ref 11.5–15.5)
WBC: 9.8 10*3/uL (ref 4.0–10.5)

## 2014-10-26 LAB — LEGIONELLA ANTIGEN, URINE

## 2014-10-26 MED ORDER — FUROSEMIDE 10 MG/ML IJ SOLN
40.0000 mg | Freq: Once | INTRAMUSCULAR | Status: AC
  Administered 2014-10-26: 40 mg via INTRAVENOUS
  Filled 2014-10-26: qty 4

## 2014-10-26 MED ORDER — INSULIN NPH (HUMAN) (ISOPHANE) 100 UNIT/ML ~~LOC~~ SUSP
45.0000 [IU] | Freq: Every day | SUBCUTANEOUS | Status: DC
  Administered 2014-10-26: 45 [IU] via SUBCUTANEOUS

## 2014-10-26 MED ORDER — SODIUM POLYSTYRENE SULFONATE 15 GM/60ML PO SUSP
15.0000 g | Freq: Once | ORAL | Status: AC
Start: 2014-10-26 — End: 2014-10-26
  Administered 2014-10-26: 15 g via ORAL
  Filled 2014-10-26: qty 60

## 2014-10-26 NOTE — Progress Notes (Signed)
Destiny [name] [id number] Admission Data: 10/26/2014 5:06 PM Attending Provider: Verlee Monte, MD  XTK:WIOXB, [name], MD Consults/ Treatment Team:    Destiny [name] is a 74 y.o. female patient admitted from ED awake, alert  & orientated  X 3,  Full Code, VSS - Blood pressure 152/73, pulse 72, temperature 97.5 F (36.4 C), temperature source Oral, resp. rate 14, height 5\' 4"  (1.626 m), weight 122.698 kg (270 lb 8 oz), SpO2 96 %., O2    2 L nasal cannular, no c/o shortness of breath, no c/o chest pain, no distress noted. Tele # 6 placed and pt is currently running:atrial fibrillation, with ventricular rate of 79.   IV site WDL:  forearm right, condition patent and no redness with a transparent dsg that's clean dry and intact.  Allergies:   Allergies  Allergen Reactions  . Sulfonamide Derivatives Other (See Comments)    Unknown; "childhood allergy/mother"     Past Medical History  Diagnosis Date  . LBBB (left bundle branch block)   . Carotid stenosis     a. 10/19/2011 carotid duplex - Mild hard plaque bilaterally. Stable 40-59% bilateral ICA stenosis. Carotid US (04/2013):  Bilateral 40-59% ICA.  F/u 1 year  . Dyslipidemia   . Hypertension   . Chronic combined systolic and diastolic CHF (congestive heart failure)     a. EF as low as 25% in 2006;  b. EF 60-65% in 12/2011;  c. 02/2013 Echo: EF 35-40, mod mid-dist antsept HK, Gr 2 DD, mild LVH.  . Back pain, chronic     "just when I walk; mass on 3rd and 4th vertebrae right lower back"  . Cardiomyopathy, ischemic     a. 2012 s/p SJM 3231-40 Uni BiV ICD, ser # [id number].  Marland Kitchen CAD (coronary artery disease)     a. 05/2001 CABG x4: LIMA->LAD, VG->D1, VG->D2, VG->RCA.  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 CKD (chronic kidney disease), stage III   . Biventricular implantable cardiac defibrillator in situ 2007, 2012    a. 2007;  b. 2012 Gen change: SJM 3231-40 Uni BiV ICD, ser # [id number].  Marland Kitchen Hypoxemia requiring  supplemental oxygen   . Candidiasis of vulva and vagina   . Hypothyroidism   . 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 Benay Spice, in remission  . History of pneumonia 12/27/11    "3 times; it's been a long time ago"  . OSA on CPAP   . Gout ~ 08/2011  . PAF (paroxysmal atrial fibrillation)     on Coumadin  . DM (diabetes mellitus), type 2 with complications     insulin dependent, retinopathy, neuropathy  . Mural thrombus of left ventricle     before 2003, while on Coumadin, No h/o CVA  . Morbid obesity with BMI of 45.0-49.9, adult     Ht. 5'4". BMI 47.2  . Vitamin D deficiency 06/09/2012    historical  . Olecranon bursitis of right elbow 12/29/2012  . History of recurrent UTIs   . Diabetes 05/24/2013  . Hypercalcemia 09/26/2013  . Anemia, unspecified 12/13/2013  . Renal insufficiency 03/14/2014    Following with [facility], Dr Loyal Buba  . Medicare annual wellness visit, subsequent 09/12/2014    Medication: Reviewed with patient and updated  Preferred Pharmacy and which med where: 90 day supply/mail order: [place] [facility]: [facility]  DM supplies: [facility]   Allergies verified:  UTD   Immunization Status: Flu vaccine-- 01/12/14 Tdap-- 01/28/12 PNA-- 03/11/13 (23)  Shingles--08/19/07  MMG-- 06/25/08 with Johnnette Gourd, MD at [facility]    History:  obtained from the patient.  Pt orientation to unit, room and routine. Information packet given to patient/family and safety video watched.  Admission INP armband ID verified with patient/family, and in place. SR up x 2, fall risk assessment complete with Patient and family verbalizing understanding of risks associated with falls. Pt verbalizes an understanding of how to use the call bell and to call for help before getting out of bed.  Skin, clean-dry- intact without evidence of bruising, or skin tears.   No evidence of skin break down noted on exam. no  rashes, no ecchymoses\    Will cont to monitor and assist as needed.  Michelle Wnek Margaretha Sheffield, RN 10/26/2014 5:06 PM

## 2014-10-26 NOTE — Progress Notes (Signed)
Report called to Ginger, RN, all questions answered.  Patient notified husband of transfer to 5W-06.  No complaints

## 2014-10-26 NOTE — Progress Notes (Signed)
[facility] PROGRESS NOTE   Destiny [name] YYT:[id number] DOB: 03/28/1941 DOA: 10/24/2014 PCP: Penni Homans, MD  HPI/Subjective: Again seen with husband at bedside, still on 3 L of oxygen sats in the high 90s. Turned oxygen down to 2 L. We'll better, less shortness of breath and less cough.  Assessment/Plan: Principal Problem:   Acute and chronic respiratory failure (acute-on-chronic) Active Problems:   NHL (non-Hodgkin's lymphoma)   Diabetes mellitus type 2 with retinopathy   Essential hypertension   ATRIAL FIBRILLATION, PAROXYSMAL   Automatic implantable cardioverter-defibrillator in situ   OSA (obstructive sleep apnea)   Hypothyroidism   Anemia   Type 2 diabetes mellitus, controlled, with renal complications   Morbid obesity   Acute on chronic combined systolic and diastolic congestive heart failure   Hypoglycemia   Acute on chronic respiratory failure Patient came to the hospital complains of shortness of breath and cough. Chest x-ray showed interstitial marking consistent with CHF versus atypical pneumonia/interstitial inflammation. Patient denies any fever, chills, productive cough or leukocytosis. Started on DuoNeb, Solu-Medrol, flutter valve and Lasix. Patient feels much better, I'll diuresis again today as she has lower extremity edema. PT/OT, if she is doing okay she might be candidate for discharge in a.m.  Acute on chronic combined CHF As mentioned above SOB, x-ray consistent with CHF. On Coreg, repeat IV Lasix., Held on cardiology consultation as she is improving.  Hypothyroidism Continue Synthroid.  Hypoglycemia Presented with blood sugar of 50, likely secondary to poor oral intake. Insulin was held, blood sugar is in the high side, restarted today.  Atrial fibrillation She has a paced rhythm, on Coumadin, INR is 3.17 This patients CHA2DS2-VASc Score and unadjusted Ischemic Stroke Rate (% per year) is equal to 7.2 % stroke rate/year from a score  of 5 for CHF, HTN, DM, age and female gender.  Above score calculated as 1 point each if present [CHF, HTN, DM, Vascular=MI/PAD/Aortic Plaque, Age if 65-74, or Female] Above score calculated as 2 points each if present [Age > 75, or Stroke/TIA/TE]  CKD stage III She is on her baseline at 2.93.   Code Status: Full Code Family Communication: Plan discussed with the patient. Disposition Plan: Remains inpatient Diet: Diet heart healthy/carb modified Room service appropriate?: Yes; Fluid consistency:: Thin  Consultants:  None  Procedures:  None  Antibiotics:  None   Objective: Filed Vitals:   10/26/14 0733  BP: 152/73  Pulse: 72  Temp:   Resp: 14    Intake/Output Summary (Last 24 hours) at 10/26/14 1130 Last data filed at 10/25/14 2300  Gross per 24 hour  Intake    560 ml  Output    725 ml  Net   -165 ml   Filed Weights   10/25/14 0142 10/26/14 0530  Weight: 121.473 kg (267 lb 12.8 oz) 122.698 kg (270 lb 8 oz)    Exam: General: Alert and awake, oriented x3, not in any acute distress. HEENT: anicteric sclera, pupils reactive to light and accommodation, EOMI CVS: S1-S2 clear, no murmur rubs or gallops Chest: clear to auscultation bilaterally, no wheezing, rales or rhonchi Abdomen: soft nontender, nondistended, normal bowel sounds, no organomegaly Extremities: no cyanosis, clubbing or edema noted bilaterally Neuro: Cranial nerves II-XII intact, no focal neurological deficits  Data Reviewed: Basic Metabolic Panel:  Recent Labs Lab 10/20/14 1243 10/24/14 2232 10/25/14 0453 10/25/14 0904 10/26/14 0305  NA 141 141 138  --  136  K 4.9 5.0 5.9* 6.0* 5.2*  CL 105 105 105  --  103  CO2 27 25 25   --  24  GLUCOSE 52* 50* 175*  --  324*  BUN 74* 71* 70*  --  74*  CREATININE 2.91* 2.97* 2.98*  --  2.93*  CALCIUM 9.3 8.9 8.5*  --  8.4*   Liver Function Tests:  Recent Labs Lab 10/24/14 2233 10/25/14 0453  AST 14* 14*  ALT 12* 12*  ALKPHOS 37* 36*  BILITOT  0.6 0.6  PROT 7.2 7.0  ALBUMIN 3.5 3.7   No results for input(s): LIPASE, AMYLASE in the last 168 hours. No results for input(s): AMMONIA in the last 168 hours. CBC:  Recent Labs Lab 10/24/14 2232 10/25/14 0453 10/26/14 0305  WBC 9.6 7.8 9.8  NEUTROABS 7.0 6.8  --   HGB 10.2* 10.3* 9.3*  HCT 31.9* 32.6* 29.0*  MCV 91.4 92.1 90.3  PLT 218 196 192   Cardiac Enzymes:  Recent Labs Lab 10/24/14 2232 10/25/14 0453 10/25/14 1157 10/25/14 1810  TROPONINI <0.03 <0.03 <0.03 <0.03   BNP (last 3 results)  Recent Labs  07/28/14 2048 09/25/14 2159 10/24/14 2233  BNP 353.9* 237.5* 215.2*    ProBNP (last 3 results) No results for input(s): PROBNP in the last 8760 hours.  CBG:  Recent Labs Lab 10/25/14 0839 10/25/14 1303 10/25/14 1751 10/25/14 2136 10/26/14 0817  GLUCAP 207* 390* 395* 423* 205*    Micro Recent Results (from the past 240 hour(s))  MRSA PCR Screening     Status: None   Collection Time: 10/25/14  3:19 AM  Result Value Ref Range Status   MRSA by PCR NEGATIVE NEGATIVE Final    Comment:        The GeneXpert MRSA Assay (FDA approved for NASAL specimens only), is one component of a comprehensive MRSA colonization surveillance program. It is not intended to diagnose MRSA infection nor to guide or monitor treatment for MRSA infections.   Culture, blood (routine x 2) Call MD if unable to obtain prior to antibiotics being given     Status: None (Preliminary result)   Collection Time: 10/25/14  4:05 AM  Result Value Ref Range Status   Specimen Description BLOOD LEFT HAND  Final   Special Requests BOTTLES DRAWN AEROBIC ONLY 10CC  Final   Culture   Final           BLOOD CULTURE RECEIVED NO GROWTH TO DATE CULTURE WILL BE HELD FOR 5 DAYS BEFORE ISSUING A FINAL NEGATIVE REPORT Performed at [facility]    Report Status PENDING  Incomplete  Culture, blood (routine x 2) Call MD if unable to obtain prior to antibiotics being given     Status: None  (Preliminary result)   Collection Time: 10/25/14  4:05 AM  Result Value Ref Range Status   Specimen Description BLOOD RIGHT HAND  Final   Special Requests BOTTLES DRAWN AEROBIC ONLY 3CC  Final   Culture   Final           BLOOD CULTURE RECEIVED NO GROWTH TO DATE CULTURE WILL BE HELD FOR 5 DAYS BEFORE ISSUING A FINAL NEGATIVE REPORT Performed at [facility]    Report Status PENDING  Incomplete     Studies: Dg Chest Portable 1 View  10/24/2014   CLINICAL DATA:  Acute onset of shortness of breath. Initial encounter.  EXAM: PORTABLE CHEST - 1 VIEW  COMPARISON:  Chest radiograph performed 10/05/2014  FINDINGS: The lungs are well-aerated. Vascular congestion is noted. Increased interstitial markings and bibasilar airspace opacities raise concern  for mild pulmonary edema. There is no evidence of pleural effusion or pneumothorax.  The cardiomediastinal silhouette is mildly enlarged. The patient is status post median sternotomy. A pacemaker/AICD is noted overlying the left chest wall, with leads ending overlying the right atrium, right ventricle and coronary sinus. No acute osseous abnormalities are seen.  IMPRESSION: Vascular congestion and mild cardiomegaly. Increased interstitial markings and bibasilar airspace opacities raise concern for mild pulmonary edema.   Electronically Signed   By: Garald Balding M.D.   On: 10/24/2014 22:32    Scheduled Meds: . atorvastatin  20 mg Oral Daily  . carvedilol  25 mg Oral BID WC  . escitalopram  10 mg Oral Daily  . gabapentin  300 mg Oral BID  . insulin NPH Human  15 Units Subcutaneous QHS  . insulin NPH Human  45 Units Subcutaneous QAC breakfast  . levothyroxine  25 mcg Oral QAC breakfast  . sodium chloride  3 mL Intravenous Q12H  . warfarin  5 mg Oral Q T,Th,S,Su-1800  . warfarin  7.5 mg Oral Q M,W,F-1800  . Warfarin - Pharmacist Dosing Inpatient   Does not apply q1800   Continuous Infusions:      Time spent: 35 minutes    [facility]  A  [facility] Pager [phone] If 7PM-7AM, please contact night-coverage at [email or web address], password [facility] 10/26/2014, 11:30 AM  LOS: 1 day             ]

## 2014-10-26 NOTE — Progress Notes (Signed)
[facility]  Indication: atrial fibrillation  Allergies  Allergen Reactions  . Sulfonamide Derivatives Other (See Comments)    Unknown; "childhood allergy/mother"    Patient Measurements: Height: 5\' 4"  (162.6 cm) Weight: 270 lb 8 oz (122.698 kg) IBW/kg (Calculated) : 54.7  Vital Signs: Temp: 98.1 F (36.7 C) (06/14 1203) Temp Source: Oral (06/14 1203) BP: 152/73 mmHg (06/14 0733) Pulse Rate: 72 (06/14 0733)  Labs:  Recent Labs  10/24/14 2232 10/24/14 2233 10/25/14 0453 10/25/14 1157 10/25/14 1810 10/26/14 0305  HGB 10.2*  --  10.3*  --   --  9.3*  HCT 31.9*  --  32.6*  --   --  29.0*  PLT 218  --  196  --   --  192  LABPROT  --  29.5* 27.5*  --   --  31.9*  INR  --  2.86* 2.61*  --   --  3.17*  CREATININE 2.97*  --  2.98*  --   --  2.93*  TROPONINI <0.03  --  <0.03 <0.03 <0.03  --     Estimated Creatinine Clearance: 22.4 mL/min (by C-G formula based on Cr of 2.93).   Medical History: Past Medical History  Diagnosis Date  . LBBB (left bundle branch block)   . Carotid stenosis     a. 10/19/2011 carotid duplex - Mild hard plaque bilaterally. Stable 40-59% bilateral ICA stenosis. Carotid US (04/2013):  Bilateral 40-59% ICA.  F/u 1 year  . Dyslipidemia   . Hypertension   . Chronic combined systolic and diastolic CHF (congestive heart failure)     a. EF as low as 25% in 2006;  b. EF 60-65% in 12/2011;  c. 02/2013 Echo: EF 35-40, mod mid-dist antsept HK, Gr 2 DD, mild LVH.  . Back pain, chronic     "just when I walk; mass on 3rd and 4th vertebrae right lower back"  . Cardiomyopathy, ischemic     a. 2012 s/p SJM 3231-40 Uni BiV ICD, ser # [id number].  Marland Kitchen CAD (coronary artery disease)     a. 05/2001 CABG x4: LIMA->LAD, VG->D1, VG->D2, VG->RCA.  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 CKD (chronic kidney disease), stage III   . Biventricular implantable cardiac defibrillator in situ 2007, 2012    a. 2007;  b. 2012 Gen change: SJM 3231-40 Uni BiV ICD, ser # [id number].  Marland Kitchen Hypoxemia requiring supplemental oxygen   . Candidiasis of vulva and vagina   . Hypothyroidism   . 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 Benay Spice, in remission  . History of pneumonia 12/27/11    "3 times; it's been a long time ago"  . OSA on CPAP   . Gout ~ 08/2011  . PAF (paroxysmal atrial fibrillation)     on Coumadin  . DM (diabetes mellitus), type 2 with complications     insulin dependent, retinopathy, neuropathy  . Mural thrombus of left ventricle     before 2003, while on Coumadin, No h/o CVA  . Morbid obesity with BMI of 45.0-49.9, adult     Ht. 5'4". BMI 47.2  . Vitamin D deficiency 06/09/2012    historical  . Olecranon bursitis of right elbow 12/29/2012  . History of recurrent UTIs   . Diabetes 05/24/2013  . Hypercalcemia 09/26/2013  . Anemia, unspecified 12/13/2013  . Renal insufficiency 03/14/2014    Following with [facility], Dr  Caldanato  . Medicare annual wellness visit, subsequent 09/12/2014    Medication: Reviewed with patient and updated  Preferred Pharmacy and which med where: 90 day supply/mail order: [place] [facility]: [place]  DM supplies: [facility]   Allergies verified: UTD   Immunization Status: Flu vaccine-- 01/12/14 Tdap-- 01/28/12 PNA-- 03/11/13 (23)  Shingles--08/19/07  MMG-- 06/25/08 with Johnnette Gourd, [facility] at [facility]   Assessment: 74 y/o F here with SOB, on warfarin PTA for afib INR slightly supra-therapeutic   Goal of Therapy:  INR 2-3 Monitor platelets by anticoagulation protocol: Yes   Plan:  -Resume home dose warfarin: 7.5 mg on Mon/Wed/Fri, 5 mg all other days -Daily PT/INR, adjust dose as needed -Monitor for bleeding  Thank you. Anette Guarneri, PharmD [phone]  Tad Moore 10/26/2014,1:28 PM

## 2014-10-26 NOTE — Progress Notes (Signed)
Inpatient Diabetes Program Recommendations  AACE/ADA: New Consensus Statement on Inpatient Glycemic Control (2013)  Target Ranges:  Prepandial:   less than 140 mg/dL      Peak postprandial:   less than 180 mg/dL (1-2 hours)      Critically ill patients:  140 - 180 mg/dL   Results for Destiny [name], Destiny [name] (MRN [id number]) as of 10/26/2014 09:40  Ref. Range 10/25/2014 08:39 10/25/2014 13:03 10/25/2014 17:51 10/25/2014 21:36  Glucose-Capillary Latest Ref Range: 65-99 mg/dL 207 (H) 390 (H) 395 (H) 423 (H)   Reason for Visit: SOB  Diabetes history: DM 2 Outpatient Diabetes medications: NPH 45 units QAM, 15 units QPM, Novolin R 15 units TID Current orders for Inpatient glycemic control: NPH 15 units QPM  Inpatient Diabetes Program Recommendations Insulin - Basal: Please consider ordering NPH 45 units QAM, 15 units QPM (patient's home dose). Correction (SSI): Please consider ordering correction scale Novolog Moderate (0-15 units) TID for meal coverage.   Thanks,  Tama Headings RN, MSN, [facility] Inpatient Diabetes Coordinator Team Pager [phone]

## 2014-10-27 ENCOUNTER — Inpatient Hospital Stay (HOSPITAL_COMMUNITY): Payer: Medicare Other

## 2014-10-27 DIAGNOSIS — J9621 Acute and chronic respiratory failure with hypoxia: Secondary | ICD-10-CM

## 2014-10-27 DIAGNOSIS — E11319 Type 2 diabetes mellitus with unspecified diabetic retinopathy without macular edema: Secondary | ICD-10-CM

## 2014-10-27 DIAGNOSIS — I4891 Unspecified atrial fibrillation: Secondary | ICD-10-CM

## 2014-10-27 DIAGNOSIS — E162 Hypoglycemia, unspecified: Secondary | ICD-10-CM

## 2014-10-27 DIAGNOSIS — I5043 Acute on chronic combined systolic (congestive) and diastolic (congestive) heart failure: Principal | ICD-10-CM

## 2014-10-27 LAB — BASIC METABOLIC PANEL
ANION GAP: 10 (ref 5–15)
BUN: 64 mg/dL — ABNORMAL HIGH (ref 6–20)
CHLORIDE: 102 mmol/L (ref 101–111)
CO2: 27 mmol/L (ref 22–32)
Calcium: 8.8 mg/dL — ABNORMAL LOW (ref 8.9–10.3)
Creatinine, Ser: 2.42 mg/dL — ABNORMAL HIGH (ref 0.44–1.00)
GFR calc non Af Amer: 19 mL/min — ABNORMAL LOW (ref >60)
GFR, EST AFRICAN AMERICAN: 22 mL/min — AB (ref >60)
Glucose, Bld: 62 mg/dL — ABNORMAL LOW (ref 65–99)
POTASSIUM: 4.6 mmol/L (ref 3.5–5.1)
Sodium: 139 mmol/L (ref 135–145)

## 2014-10-27 LAB — GLUCOSE, CAPILLARY
GLUCOSE-CAPILLARY: 67 mg/dL (ref 65–99)
Glucose-Capillary: 92 mg/dL (ref 65–99)
Glucose-Capillary: 134 mg/dL — ABNORMAL HIGH (ref 65–99)
Glucose-Capillary: 187 mg/dL — ABNORMAL HIGH (ref 65–99)
Glucose-Capillary: 293 mg/dL — ABNORMAL HIGH (ref 65–99)

## 2014-10-27 LAB — PROTIME-INR
INR: 3.31 — ABNORMAL HIGH (ref 0.00–1.49)
PROTHROMBIN TIME: 32.9 s — AB (ref 11.6–15.2)

## 2014-10-27 MED ORDER — INSULIN NPH (HUMAN) (ISOPHANE) 100 UNIT/ML ~~LOC~~ SUSP
10.0000 [IU] | Freq: Every day | SUBCUTANEOUS | Status: DC
  Administered 2014-10-27 – 2014-10-28 (×2): 10 [IU] via SUBCUTANEOUS

## 2014-10-27 MED ORDER — MORPHINE SULFATE 2 MG/ML IJ SOLN
2.0000 mg | Freq: Once | INTRAMUSCULAR | Status: AC
  Administered 2014-10-27: 2 mg via INTRAVENOUS
  Filled 2014-10-27: qty 1

## 2014-10-27 MED ORDER — FUROSEMIDE 10 MG/ML IJ SOLN
40.0000 mg | Freq: Once | INTRAMUSCULAR | Status: AC
  Administered 2014-10-27: 40 mg via INTRAVENOUS
  Filled 2014-10-27: qty 4

## 2014-10-27 MED ORDER — FUROSEMIDE 10 MG/ML IJ SOLN
60.0000 mg | Freq: Two times a day (BID) | INTRAMUSCULAR | Status: DC
  Administered 2014-10-27 – 2014-10-28 (×3): 60 mg via INTRAVENOUS
  Filled 2014-10-27 (×6): qty 6

## 2014-10-27 MED ORDER — METOPROLOL TARTRATE 1 MG/ML IV SOLN
2.5000 mg | Freq: Once | INTRAVENOUS | Status: AC
  Administered 2014-10-27: 2.5 mg via INTRAVENOUS
  Filled 2014-10-27: qty 5

## 2014-10-27 MED ORDER — ALBUTEROL SULFATE (2.5 MG/3ML) 0.083% IN NEBU
INHALATION_SOLUTION | RESPIRATORY_TRACT | Status: AC
Start: 2014-10-27 — End: 2014-10-27
  Filled 2014-10-27: qty 3

## 2014-10-27 MED ORDER — ALBUTEROL SULFATE (2.5 MG/3ML) 0.083% IN NEBU
2.5000 mg | INHALATION_SOLUTION | Freq: Once | RESPIRATORY_TRACT | Status: AC
  Administered 2014-10-27: 2.5 mg via RESPIRATORY_TRACT

## 2014-10-27 MED ORDER — INSULIN NPH (HUMAN) (ISOPHANE) 100 UNIT/ML ~~LOC~~ SUSP
35.0000 [IU] | Freq: Every day | SUBCUTANEOUS | Status: DC
  Administered 2014-10-28 – 2014-10-29 (×2): 35 [IU] via SUBCUTANEOUS
  Filled 2014-10-27: qty 10

## 2014-10-27 MED ORDER — GI COCKTAIL ~~LOC~~
30.0000 mL | Freq: Once | ORAL | Status: AC
  Administered 2014-10-27: 30 mL via ORAL
  Filled 2014-10-27: qty 30

## 2014-10-27 NOTE — Progress Notes (Signed)
Hypoglycemic Event  CBG: 67  Treatment: 15 GM carbohydrate snack  Symptoms: None  Follow-up CBG: [id number] CBG Result:92  Possible Reasons for Event: Unknown  Comments/MD notified:Dr. Maryland Pink notified. Order to hold NPH insulin    Florentina Addison  Remember to initiate Hypoglycemia Order Set & complete

## 2014-10-27 NOTE — Progress Notes (Signed)
[facility] PROGRESS NOTE  Destiny [name] JEH:[id number] DOB: Jan 19, 1941 DOA: 10/24/2014  PCP: Penni Homans, MD  Brief HPI: 74 year old Caucasian female with a past medical history of chronic combined systolic and diastolic congestive heart failure with EF of 25-30% on recent echocardiogram, pacemaker placement, coronary artery disease, chronic kidney disease, diabetes mellitus type 2, obstructive sleep apnea on C Pap presented with complaints of shortness of breath. She was noted to have pulmonary edema on chest x-ray. She was given intravenous Lasix and was hospitalized for further management. She had a recent hospitalization for same problem a few weeks earlier.  Past medical history:  Past Medical History  Diagnosis Date  . LBBB (left bundle branch block)   . Carotid stenosis     a. 10/19/2011 carotid duplex - Mild hard plaque bilaterally. Stable 40-59% bilateral ICA stenosis. Carotid US (04/2013):  Bilateral 40-59% ICA.  F/u 1 year  . Dyslipidemia   . Hypertension   . Chronic combined systolic and diastolic CHF (congestive heart failure)     a. EF as low as 25% in 2006;  b. EF 60-65% in 12/2011;  c. 02/2013 Echo: EF 35-40, mod mid-dist antsept HK, Gr 2 DD, mild LVH.  . Back pain, chronic     "just when I walk; mass on 3rd and 4th vertebrae right lower back"  . Cardiomyopathy, ischemic     a. 2012 s/p SJM 3231-40 Uni BiV ICD, ser # [id number].  Marland Kitchen CAD (coronary artery disease)     a. 05/2001 CABG x4: LIMA->LAD, VG->D1, VG->D2, VG->RCA.  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 CKD (chronic kidney disease), stage III   . Biventricular implantable cardiac defibrillator in situ 2007, 2012    a. 2007;  b. 2012 Gen change: SJM 3231-40 Uni BiV ICD, ser # [id number].  Marland Kitchen Hypoxemia requiring supplemental oxygen   . Candidiasis of vulva and vagina   . Hypothyroidism   . 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 Benay Spice, in  remission  . History of pneumonia 12/27/11    "3 times; it's been a long time ago"  . OSA on CPAP   . Gout ~ 08/2011  . PAF (paroxysmal atrial fibrillation)     on Coumadin  . DM (diabetes mellitus), type 2 with complications     insulin dependent, retinopathy, neuropathy  . Mural thrombus of left ventricle     before 2003, while on Coumadin, No h/o CVA  . Morbid obesity with BMI of 45.0-49.9, adult     Ht. 5'4". BMI 47.2  . Vitamin D deficiency 06/09/2012    historical  . Olecranon bursitis of right elbow 12/29/2012  . History of recurrent UTIs   . Diabetes 05/24/2013  . Hypercalcemia 09/26/2013  . Anemia, unspecified 12/13/2013  . Renal insufficiency 03/14/2014    Following with  Kidney, Dr Loyal Buba  . Medicare annual wellness visit, subsequent 09/12/2014    Medication: Reviewed with patient and updated  Preferred Pharmacy and which med where: 90 day supply/mail order: [place] [facility]: [place]  DM supplies: [facility]   Allergies verified: UTD   Immunization Status: Flu vaccine-- 01/12/14 Tdap-- 01/28/12 PNA-- 03/11/13 (23)  Shingles--08/19/07  MMG-- 06/25/08 with Johnnette Gourd, MD at [facility]    Consultants: Heart failure team  Procedures: None  Antibiotics: None  Subjective: Patient feels better this morning. Still some short of breath. Denies any chest  pain. No nausea, vomiting. No abdominal pain. Husband is at bedside.  Objective: Vital Signs  Filed Vitals:   10/27/14 0250 10/27/14 0508 10/27/14 0510 10/27/14 0856  BP:  112/70    Pulse: 94 111 124   Temp:  99.2 F (37.3 C)    TempSrc:  Oral    Resp:  20    Height:      Weight:   123 kg (271 lb 2.7 oz)   SpO2: 93% 89% 90% 93%    Intake/Output Summary (Last 24 hours) at 10/27/14 1338 Last data filed at 10/27/14 1100  Gross per 24 hour  Intake    440 ml  Output   1500 ml  Net  -1060 ml   Filed Weights   10/25/14 0142 10/26/14 0530 10/27/14 0510    Weight: 121.473 kg (267 lb 12.8 oz) 122.698 kg (270 lb 8 oz) 123 kg (271 lb 2.7 oz)    General appearance: alert, cooperative, appears stated age, no distress and moderately obese Resp: Fine crackles present, about one thirds of the lung fields bilaterally. No wheezing. No rhonchi. Cardio: S1, S2 is irregularly irregular. No S3, S4. No rubs, murmurs, or bruit. Minimal pedal edema.  GI: soft, non-tender; bowel sounds normal; no masses,  no organomegaly Extremities: Minimal pedal edema bilaterally Neurologic: No focal deficits  Lab Results:  Basic Metabolic Panel:  Recent Labs Lab 10/24/14 2232 10/25/14 0453 10/25/14 0904 10/26/14 0305 10/27/14 0717  NA 141 138  --  136 139  K 5.0 5.9* 6.0* 5.2* 4.6  CL 105 105  --  103 102  CO2 25 25  --  24 27  GLUCOSE 50* 175*  --  324* 62*  BUN 71* 70*  --  74* 64*  CREATININE 2.97* 2.98*  --  2.93* 2.42*  CALCIUM 8.9 8.5*  --  8.4* 8.8*   Liver Function Tests:  Recent Labs Lab 10/24/14 2233 10/25/14 0453  AST 14* 14*  ALT 12* 12*  ALKPHOS 37* 36*  BILITOT 0.6 0.6  PROT 7.2 7.0  ALBUMIN 3.5 3.7   CBC:  Recent Labs Lab 10/24/14 2232 10/25/14 0453 10/26/14 0305  WBC 9.6 7.8 9.8  NEUTROABS 7.0 6.8  --   HGB 10.2* 10.3* 9.3*  HCT 31.9* 32.6* 29.0*  MCV 91.4 92.1 90.3  PLT 218 196 192   Cardiac Enzymes:  Recent Labs Lab 10/24/14 2232 10/25/14 0453 10/25/14 1157 10/25/14 1810  TROPONINI <0.03 <0.03 <0.03 <0.03   BNP (last 3 results)  Recent Labs  07/28/14 2048 09/25/14 2159 10/24/14 2233  BNP 353.9* 237.5* 215.2*    CBG:  Recent Labs Lab 10/26/14 1655 10/26/14 2132 10/27/14 0803 10/27/14 0840 10/27/14 1156  GLUCAP 215* 173* 67 92 134*    Recent Results (from the past 240 hour(s))  MRSA PCR Screening     Status: None   Collection Time: 10/25/14  3:19 AM  Result Value Ref Range Status   MRSA by PCR NEGATIVE NEGATIVE Final    Comment:        The GeneXpert MRSA Assay (FDA approved for NASAL  specimens only), is one component of a comprehensive MRSA colonization surveillance program. It is not intended to diagnose MRSA infection nor to guide or monitor treatment for MRSA infections.   Culture, blood (routine x 2) Call MD if unable to obtain prior to antibiotics being given     Status: None (Preliminary result)   Collection Time: 10/25/14  4:05 AM  Result Value Ref Range Status  Specimen Description BLOOD LEFT HAND  Final   Special Requests BOTTLES DRAWN AEROBIC ONLY 10CC  Final   Culture   Final           BLOOD CULTURE RECEIVED NO GROWTH TO DATE CULTURE WILL BE HELD FOR 5 DAYS BEFORE ISSUING A FINAL NEGATIVE REPORT Performed at [facility]    Report Status PENDING  Incomplete  Culture, blood (routine x 2) Call MD if unable to obtain prior to antibiotics being given     Status: None (Preliminary result)   Collection Time: 10/25/14  4:05 AM  Result Value Ref Range Status   Specimen Description BLOOD RIGHT HAND  Final   Special Requests BOTTLES DRAWN AEROBIC ONLY 3CC  Final   Culture   Final           BLOOD CULTURE RECEIVED NO GROWTH TO DATE CULTURE WILL BE HELD FOR 5 DAYS BEFORE ISSUING A FINAL NEGATIVE REPORT Performed at [facility]    Report Status PENDING  Incomplete      Studies/Results: No results found.  Medications:  Scheduled: . albuterol      . atorvastatin  20 mg Oral Daily  . carvedilol  25 mg Oral BID WC  . escitalopram  10 mg Oral Daily  . gabapentin  300 mg Oral BID  . insulin NPH Human  15 Units Subcutaneous QHS  . insulin NPH Human  45 Units Subcutaneous QAC breakfast  . levothyroxine  25 mcg Oral QAC breakfast  . sodium chloride  3 mL Intravenous Q12H  . Warfarin - Pharmacist Dosing Inpatient   Does not apply q1800   Continuous:  [facility]:XNATFT chloride, acetaminophen, levalbuterol, LORazepam, ondansetron (ZOFRAN) IV, sodium chloride  Assessment/Plan:  Principal Problem:   Acute and chronic respiratory failure  (acute-on-chronic) Active Problems:   NHL (non-Hodgkin's lymphoma)   Diabetes mellitus type 2 with retinopathy   Essential hypertension   ATRIAL FIBRILLATION, PAROXYSMAL   Automatic implantable cardioverter-defibrillator in situ   OSA (obstructive sleep apnea)   Hypothyroidism   Anemia   Type 2 diabetes mellitus, controlled, with renal complications   Morbid obesity   Acute on chronic combined systolic and diastolic congestive heart failure   Hypoglycemia    Acute on chronic respiratory failure Most likely secondary to acute systolic congestive heart failure. Patient improved with Lasix, but then had a an episode of slurred decompensation last night. Once again has improved with Lasix. Repeat chest x-rays pending. Initially she was started on nebulizer treatments and steroids, which have been discontinued. Infectious etiology is unlikely.   Acute on chronic combined CHF As mentioned above SOB, x-ray consistent with CHF. EF based on recent echocardiogram is 25-30%. This is patient's second hospitalization within the last 4 weeks for similar complaints. Despite being on adequate treatment she is developing fluid overload. Will consult cardiology/heart failure team to assist with management. She is also noted to be tachycardic with persistent A. fib. This could be contributing to her presentation. Amiodarone was started recently, but was discontinued at the time of admission as it was felt that it could've contributed to her symptoms, though it is less likely to have done so. Will defer further management to cardiology. Continue IV Lasix. Continue beta blocker. No ACE inhibitor's due to chronic kidney disease. Repeat chest x-ray is pending. Strict ins and outs. Daily weights.  Hypothyroidism Continue Synthroid.  Hypoglycemia in the setting of type 2 diabetes on insulin with chronic kidney disease Adjust dose of her NPH. HbA1c was 7.2  on May 15.   Atrial fibrillation with mild RVR Heart  rate is poorly controlled. She was recently started on amiodarone, which was discontinued at the time of admission. Cardiology to address further. She has a paced rhythm, on Coumadin, INR is 3.17. This patients CHA2DS2-VASc Score and unadjusted Ischemic Stroke Rate (% per year) is equal to 7.2 % stroke rate/year from a score of 5 for CHF, HTN, DM, age and female gender.   CKD stage III Creatinine is close to baseline. Hyperkalemia has been corrected.   DVT Prophylaxis: On warfarin    Code Status: Full code  Family Communication: Discussed with the patient and her husband  Disposition Plan: Not ready for discharge. Will likely return home when improved. PT and OT to evaluate.  Follow-up Appointment?: Will need close follow-up with heart failure team within 7-10 days of discharge.   LOS: 2 days   [facility] Pager [phone] 10/27/2014, 1:38 PM  If 7PM-7AM, please contact night-coverage at [email or web address], password [email or web address]

## 2014-10-27 NOTE — Progress Notes (Signed)
Results for Bauer, ELVIRA LANGSTON (MRN [id number]) as of 10/27/2014 11:15  Ref. Range 10/26/2014 12:11 10/26/2014 16:55 10/26/2014 21:32 10/27/2014 08:03 10/27/2014 08:40  Glucose-Capillary Latest Ref Range: 65-99 mg/dL 213 (H) 215 (H) 173 (H) 67 92  CBG was 67 mg/dl this am.  Recommend decreasing evening NPH to 10-12 units at HS since fasting blood sugar is less than 70 mg/dl.  Will continue to follow while in hospital. Harvel Ricks RN BSN CDE

## 2014-10-27 NOTE — Progress Notes (Signed)
Pt has already placed herself on home CPAP machine via nasal pillows and is resting comfortably. Pt was encouraged to contact RT for assistance throughout the night if needed. RT will continue to monitor as needed.

## 2014-10-27 NOTE — Progress Notes (Signed)
Pt tele ringing VTach. EKG showed A.fib with RVR. Schorr, NP notified. Will continue to monitor pt.

## 2014-10-27 NOTE — Progress Notes (Signed)
Pt c/o sob and low o2 sats. Charge RN saw pt and called rapid RN and RT. Pt given xopenex and albuterol. Following breathing tx, Schorr, NP ordered 40 mg IV lasix. Pt states she feels better post breathing treatments. O2 sats high 80s-90 on 6 L nasal cannula. Will continue to monitor pt.

## 2014-10-27 NOTE — Progress Notes (Signed)
ANTICOAGULATION CONSULT NOTE - Follow Up Consult  Pharmacy Consult for Coumadin Indication: atrial fibrillation  Allergies  Allergen Reactions  . Sulfonamide Derivatives Other (See Comments)    Unknown; "childhood allergy/mother"    Patient Measurements: Height: 5\' 4"  (162.6 cm) Weight: 271 lb 2.7 oz (123 kg) IBW/kg (Calculated) : 54.7  Vital Signs: Temp: 99.2 F (37.3 C) (06/15 0508) Temp Source: Oral (06/15 0508) BP: 112/70 mmHg (06/15 0508) Pulse Rate: 124 (06/15 0510)  Labs:  Recent Labs  10/24/14 2232  10/25/14 0453 10/25/14 1157 10/25/14 1810 10/26/14 0305 10/27/14 0717  HGB 10.2*  --  10.3*  --   --  9.3*  --   HCT 31.9*  --  32.6*  --   --  29.0*  --   PLT 218  --  196  --   --  192  --   LABPROT  --   < > 27.5*  --   --  31.9* 32.9*  INR  --   < > 2.61*  --   --  3.17* 3.31*  CREATININE 2.97*  --  2.98*  --   --  2.93* 2.42*  TROPONINI <0.03  --  <0.03 <0.03 <0.03  --   --   < > = values in this interval not displayed.  Estimated Creatinine Clearance: 27.2 mL/min (by C-G formula based on Cr of 2.42).   Medications:  Scheduled:  . albuterol      . atorvastatin  20 mg Oral Daily  . carvedilol  25 mg Oral BID WC  . escitalopram  10 mg Oral Daily  . gabapentin  300 mg Oral BID  . insulin NPH Human  15 Units Subcutaneous QHS  . insulin NPH Human  45 Units Subcutaneous QAC breakfast  . levothyroxine  25 mcg Oral QAC breakfast  . sodium chloride  3 mL Intravenous Q12H  . warfarin  5 mg Oral Q T,Th,S,Su-1800  . warfarin  7.5 mg Oral Q M,W,F-1800  . Warfarin - Pharmacist Dosing Inpatient   Does not apply q1800    Assessment: 74 yo F on Coumadin PTA for hx of afib.  INR has been trending up on home dose.  No bleeding noted.  Will hold dose for tonight.  Goal of Therapy:  INR 2-3 Monitor platelets by anticoagulation protocol: Yes   Plan:  No Coumadin tonight. Daily INR.  [employer], Pharm.D., BCPS Clinical Pharmacist Pager  [phone] 10/27/2014 11:11 AM

## 2014-10-27 NOTE — Progress Notes (Signed)
Utilization Review completed. Nayelli Inglis RN BSN CM 

## 2014-10-27 NOTE — Progress Notes (Signed)
OT Cancellation Note  Patient Details Name: Destiny [name] MRN: [id number] DOB: 1941/04/01   Cancelled Treatment:    Reason Eval/Treat Not Completed: Other (comment) (OT screened). Spoke with pt and she reports she has no OT concerns. OT explained role. OT signing off.   Benito Mccreedy OTR/L [phone] 10/27/2014, 2:27 PM

## 2014-10-27 NOTE — Progress Notes (Signed)
Charge RN was called into room by patient's husband stating that the patient was having a hard time breathing. Rn went into room to find patient on 6L. Pulse ox was placed and patient was reading 84% on the monitor. After asking patient to calm down and take deep breaths- o2 remained the same.Rn suggested calling rapid and getting a non re-breather- husband stated that patient did not want anything on her face. April from rapid came up to look at patient. At that time patient was reading around 92-93% on the monitor. PRN albuterol was ordered. Rapid, RN, and RT at bedside. NP notified. Awaiting any further orders.

## 2014-10-27 NOTE — Evaluation (Addendum)
Physical Therapy Evaluation Patient Details Name: Destiny [name] MRN: [id number] DOB: 1940-12-14 Today's Date: 10/27/2014   History of Present Illness  74 y.o. female admitted with Acute on chronic respiratory failure and Acute on chronic combined CHF  Clinical Impression  Pt admitted with the above diagnosis. Pt currently with functional limitations due to the deficits listed below (see PT Problem List). Ambulates generally well with a rolling walker for support. SpO2 90% ambulating on 4L supplemental O2 and 95% on 6L supplemental O2. HR monitor indicated 130s briefly however was noted to be 112 at end of ambulatory distance. Of note patient typically wears 2L supplemental O2 24/7. Good family support from husband. Pt will benefit from skilled PT to increase their independence and safety with mobility to allow discharge to the venue listed below.       Follow Up Recommendations No PT follow up;Supervision - Intermittent (May benefit from cardiac rehab if MD feels pt is medically appropriate)    Equipment Recommendations  None recommended by PT    Recommendations for Other Services       Precautions / Restrictions Precautions Precautions: None Precaution Comments: monitor O2 Restrictions Weight Bearing Restrictions: No      Mobility  Bed Mobility Overal bed mobility: Modified Independent                Transfers Overall transfer level: Needs assistance Equipment used: None Transfers: Sit to/from Stand;Stand Pivot Transfers Sit to Stand: Supervision Stand pivot transfers: Supervision       General transfer comment: supervision for safety with VC for hand placement. Rocks anterior to rise from bed. No assist needed but notable sway which is greatly improved with use of RW for support  Ambulation/Gait Ambulation/Gait assistance: Supervision Ambulation Distance (Feet): 90 Feet Assistive device: Rolling walker (2 wheeled) Gait Pattern/deviations: Step-through  pattern;Decreased stride length;Wide base of support Gait velocity: decreased   General Gait Details: Good control of RW. 3/4 dyspnea, required 1 standing rest break to complete distance. HR montior indicating 134 although noisey reading and question accuracy. HR was 112 upon sitting when returned to room. SpO2 on 4L 90%, and 95% on 6L supplemental O2. no loss of balance.  Stairs            Wheelchair Mobility    Modified Rankin (Stroke Patients Only)       Balance Overall balance assessment: Needs assistance Sitting-balance support: No upper extremity supported;Feet supported Sitting balance-Leahy Scale: Good     Standing balance support: No upper extremity supported Standing balance-Leahy Scale: Fair                               Pertinent Vitals/Pain Pain Assessment: No/denies pain    Home Living Family/patient expects to be discharged to:: Private residence Living Arrangements: Spouse/significant other Available Help at Discharge: Family;Available 24 hours/day Type of Home: House Home Access: Ramped entrance     Home Layout: One level Home Equipment: Walker - 4 wheels;Grab bars - tub/shower;Hand held shower head;Shower seat;Electric scooter      Prior Function Level of Independence: Independent with assistive device(s)         Comments: 4 wheeled walker in house, scooter outside of house     Hand Dominance        Extremity/Trunk Assessment   Upper Extremity Assessment: Defer to OT evaluation           Lower Extremity Assessment: Generalized weakness  Communication   Communication: No difficulties  Cognition Arousal/Alertness: Awake/alert Behavior During Therapy: WFL for tasks assessed/performed Overall Cognitive Status: Within Functional Limits for tasks assessed                      General Comments General comments (skin integrity, edema, etc.): Reviewed signs/symptoms of hypoxia.     Exercises         Assessment/Plan    PT Assessment Patient needs continued PT services  PT Diagnosis Abnormality of gait;Generalized weakness;Difficulty walking   PT Problem List Decreased strength;Decreased activity tolerance;Decreased balance;Decreased mobility;Cardiopulmonary status limiting activity;Obesity  PT Treatment Interventions DME instruction;Gait training;Functional mobility training;Therapeutic activities;Therapeutic exercise;Balance training;Patient/family education   PT Goals (Current goals can be found in the Care Plan section) Acute Rehab PT Goals Patient Stated Goal: Go home PT Goal Formulation: With patient/family Time For Goal Achievement: 11/10/14 Potential to Achieve Goals: Good    Frequency Min 3X/week   Barriers to discharge        Co-evaluation               End of Session Equipment Utilized During Treatment: Oxygen Activity Tolerance: Patient limited by fatigue Patient left: in bed;with call bell/phone within reach;with family/visitor present           Time: 1030-1314 PT Time Calculation (min) (ACUTE ONLY): 25 min   Charges:   PT Evaluation $Initial PT Evaluation Tier I: 1 Procedure PT Treatments $Therapeutic Activity: 8-22 mins   PT G CodesEllouise [name] 10/27/2014, 6:09 PM Destiny [name], Destiny [name]

## 2014-10-27 NOTE — Consult Note (Addendum)
Advanced Heart Failure Team Consult Note  Referring Physician: Curly [name] Primary Physician: Destiny Homans, MD Primary Cardiologist:    Reason for Consultation:  HF  HPI:    Destiny [name] is a 74 y.o. female with a history of CAD, status post CABG in 3716, systolic CHF due to ischemic cardiomyopathy with EF 25-30% by Echo 10/20/14 status post CRT-D, morbid obesity, CKD stage 4, COPD with chronic respiratory failure on home O2, paroxysmal atrial fibrillation, prior LV mural thrombus, on chronic Coumadin therapy, CKD, T2DM. She has a prior admission to the hospital for acute renal failure in the setting of rhabdomyolysis (while taking colchicine and fenofibrate). Carotid US (12/14): Bilateral ICA stenosis 40-59%.   Destiny [name] presented to [facility] with c/o SOB on 10/24/14.  She had this have been gradually worsening since she had PFTs on 10-15-14. She was highly anxious for the exam and felt claustrophobic during.She states her weight had also gone up several pounds from her baseline of 260 lbs, so she began taking an extra 20 mg of torsemide for 3 days as directed, but had no improvement. She is currently at 271 lbs..  She has a chronic cough but denies increased sputum. She has chronic orthopnea but no PND.  She denies CP, fever, chills, N/V, Gi symptoms, urinary symptoms, or active bleeding.   She came from home and had been using 2 lpm of oxygen.  She increased to 5 lpm with minimal improvement. She is independent at baseline for her ADLs.  She feels much better since being admitted and feels like this is all secondary to anxiety related to her PFTs.  She feels comfortable monitoring her fluid and medications at home. She denies fluid or dietary indiscretion.    Review of Systems: [y] = yes, [ ]  = no   General: Weight gain [y]; Weight loss [ ] ; Anorexia [ ] ; Fatigue [y]; Fever [ ] ; Chills [ ] ; Weakness [ ]   Cardiac: Chest pain/pressure [ ] ; Resting SOB [ ] ; Exertional SOB [y]; Orthopnea [y]; Pedal  Edema [y]; Palpitations [ ] ; Syncope [ ] ; Presyncope [ ] ; Paroxysmal nocturnal dyspnea[ ]   Pulmonary: Cough [ ] ; Wheezing[ ] ; Hemoptysis[ ] ; Sputum [ ] ; Snoring [ ]   GI: Vomiting[ ] ; Dysphagia[ ] ; Melena[ ] ; Hematochezia [ ] ; Heartburn[ ] ; Abdominal pain [ ] ; Constipation [ ] ; Diarrhea [ ] ; BRBPR [ ]   GU: Hematuria[ ] ; Dysuria [ ] ; Nocturia[ ]   Vascular: Pain in legs with walking [ ] ; Pain in feet with lying flat [ ] ; Non-healing sores [ ] ; Stroke [ ] ; TIA [ ] ; Slurred speech [ ] ;  Neuro: Headaches[ ] ; Vertigo[ ] ; Seizures[ ] ; Paresthesias[ ] ;Blurred vision [ ] ; Diplopia [ ] ; Vision changes [ ]   Ortho/Skin: Arthritis [ ] ; Joint pain [ ] ; Muscle pain [ ] ; Joint swelling [ ] ; Back Pain [ ] ; Rash [ ]   Psych: Depression[ ] ; Anxiety[ ]   Heme: Bleeding problems [ ] ; Clotting disorders [ ] ; Anemia [ ]   Endocrine: Diabetes [y]; Thyroid dysfunction[y]  Home Medications Prior to Admission medications   Medication Sig Start Date End Date Taking? Authorizing Provider  acetaminophen (TYLENOL) 500 MG tablet Take 1,000 mg by mouth every 6 (six) hours as needed (pain).   Yes Historical Provider, MD  albuterol (PROVENTIL HFA;VENTOLIN HFA) 108 (90 BASE) MCG/ACT inhaler Inhale 2 puffs into the lungs every 6 (six) hours as needed for wheezing or shortness of breath. 12/26/12  Yes Mosie Lukes, MD  amiodarone (PACERONE) 200 MG tablet Take 1  tablet (200 mg total) by mouth 2 (two) times daily. 10/14/14  Yes Evans Lance, MD  atorvastatin (LIPITOR) 20 MG tablet Take 1 tablet (20 mg total) by mouth daily. 08/31/14  Yes Mosie Lukes, MD  carvedilol (COREG) 25 MG tablet Take 1 tablet by mouth 2 (two) times daily. 07/27/14  Yes Historical Provider, MD  enalapril (VASOTEC) 2.5 MG tablet TAKE 1 TABLET TWICE DAILY. 04/26/14  Yes Shaune Pascal Bensimhon, MD  escitalopram (LEXAPRO) 10 MG tablet Take 1 tablet (10 mg total) by mouth daily. 08/31/14  Yes Mosie Lukes, MD  fenofibrate (TRICOR) 48 MG tablet Take 1 tablet (48 mg total)  by mouth daily. 07/15/14  Yes Larey Dresser, MD  fluticasone [facility]) 50 MCG/ACT nasal spray Place 2 sprays into both nostrils daily as needed for allergies or rhinitis. As needed for nasal stuffiness. 01/13/14  Yes Mosie Lukes, MD  gabapentin (NEURONTIN) 300 MG capsule Take 1 capsule (300 mg total) by mouth 2 (two) times daily. 08/31/14  Yes Mosie Lukes, MD  glucose blood (FREESTYLE LITE) test strip DX: 250.60  Check sugars bid and as needed 05/22/13  Yes Mosie Lukes, MD  insulin NPH (HUMULIN N,NOVOLIN N) 100 UNIT/ML injection Inject 20-45 Units into the skin 2 (two) times daily before a meal. Take 45 units in the morning and 15 units in the evening   Yes Historical Provider, MD  insulin regular (NOVOLIN R,HUMULIN R) 100 units/mL injection Change to 15 units three times a with meals (to cover all meals--this is the same total amount but spread out) Patient taking differently: Inject 15 Units into the skin 2 (two) times daily before a meal. Change to 15 units three times a with meals (to cover all meals--this is the same total amount but spread out) 04/01/13  Yes Ivan Anchors Love, PA-C  levothyroxine (SYNTHROID, LEVOTHROID) 25 MCG tablet TAKE 1 TABLET EVERY DAY BEFORE BREAKFAST 08/31/14  Yes Mosie Lukes, MD  LORazepam (ATIVAN) 0.5 MG tablet Take 1 tablet (0.5 mg total) by mouth at bedtime as needed for anxiety. 08/31/14  Yes Mosie Lukes, MD  NON FORMULARY Oxygen---24 hour   Yes Historical Provider, MD  torsemide (DEMADEX) 20 MG tablet Take 2 tablets (40 mg total) by mouth once. may take an extra tab for 3# weight gain, swelling or SOB Patient taking differently: Take 20 mg by mouth 2 (two) times daily. may take an extra tab for 3# weight gain, swelling or SOB Takes one in the morning and one at 3:30 07/15/14  Yes Larey Dresser, MD  Vitamin D, Ergocalciferol, (DRISDOL) 50000 UNITS CAPS capsule Take 50,000 Units by mouth every 7 (seven) days. Tuesdays   Yes Historical Provider, MD  warfarin  (COUMADIN) 5 MG tablet Take 5-7.5 mg by mouth daily at 6 PM. 7.5mg  on Monday Wednesday and Friday. 5mg  all other days   Yes Historical Provider, MD    Past Medical History: Past Medical History  Diagnosis Date  . LBBB (left bundle branch block)   . Carotid stenosis     a. 10/19/2011 carotid duplex - Mild hard plaque bilaterally. Stable 40-59% bilateral ICA stenosis. Carotid US (04/2013):  Bilateral 40-59% ICA.  F/u 1 year  . Dyslipidemia   . Hypertension   . Chronic combined systolic and diastolic CHF (congestive heart failure)     a. EF as low as 25% in 2006;  b. EF 60-65% in 12/2011;  c. 02/2013 Echo: EF 35-40, mod mid-dist antsept HK,  Gr 2 DD, mild LVH.  . Back pain, chronic     "just when I walk; mass on 3rd and 4th vertebrae right lower back"  . Cardiomyopathy, ischemic     a. 2012 s/p SJM 3231-40 Uni BiV ICD, ser # [id number].  Marland Kitchen CAD (coronary artery disease)     a. 05/2001 CABG x4: LIMA->LAD, VG->D1, VG->D2, VG->RCA.  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 CKD (chronic kidney disease), stage III   . Biventricular implantable cardiac defibrillator in situ 2007, 2012    a. 2007;  b. 2012 Gen change: SJM 3231-40 Uni BiV ICD, ser # [id number].  Marland Kitchen Hypoxemia requiring supplemental oxygen   . Candidiasis of vulva and vagina   . Hypothyroidism   . 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 Benay Spice, in remission  . History of pneumonia 12/27/11    "3 times; it's been a long time ago"  . OSA on CPAP   . Gout ~ 08/2011  . PAF (paroxysmal atrial fibrillation)     on Coumadin  . DM (diabetes mellitus), type 2 with complications     insulin dependent, retinopathy, neuropathy  . Mural thrombus of left ventricle     before 2003, while on Coumadin, No h/o CVA  . Morbid obesity with BMI of 45.0-49.9, adult     Ht. 5'4". BMI 47.2  . Vitamin D deficiency 06/09/2012    historical  . Olecranon bursitis of right elbow 12/29/2012  . History  of recurrent UTIs   . Diabetes 05/24/2013  . Hypercalcemia 09/26/2013  . Anemia, unspecified 12/13/2013  . Renal insufficiency 03/14/2014    Following with [facility], Dr Loyal Buba  . Medicare annual wellness visit, subsequent 09/12/2014    Medication: Reviewed with patient and updated  Preferred Pharmacy and which med where: 90 day supply/mail order: [facility]: [place]  DM supplies: [facility]   Allergies verified: UTD   Immunization Status: Flu vaccine-- 01/12/14 Tdap-- 01/28/12 PNA-- 03/11/13 (23)  Shingles--08/19/07  MMG-- 06/25/08 with Johnnette Gourd, MD at [facility]    Past Surgical History: Past Surgical History  Procedure Laterality Date  . Cardiac catheterization  06/03/01  . Cholecystectomy  1970  . Tubal ligation  1972  . Abdominal hysterectomy  1982  . Insert / replace / remove pacemaker  2007; 2012    w/AICD  . Coronary artery bypass graft  2003    CABG X4  . Cataract extraction      left eye  . Cardioversion N/A 07/30/2014    Procedure: CARDIOVERSION;  Surgeon: Jolaine Artist, MD;  Location: [facility] ENDOSCOPY;  Service: Cardiovascular;  Laterality: N/A;  . Refractive surgery      right eye    Family History: Family History  Problem Relation Age of Onset  . Kidney cancer Mother     kidney and female repo - died @ 47  . Asthma Mother   . Cancer Mother     gyn and renal  . Heart disease Mother     CAD  . Heart disease Father     died @ 29  . Stroke Father   . Diabetes Father   . Hypertension Father   . Hyperlipidemia Father   . Hypertension Son   . Hypertension Maternal Aunt   . Kidney cancer Maternal Uncle   . Cancer Maternal Uncle   . Cirrhosis Maternal Grandmother  non alcohol  . Cancer Maternal Grandfather   . Kidney cancer Maternal Grandfather     Social History: History   Social History  . Marital Status: Married    Spouse Name: N/A  . Number of Children: Y  . Years of  Education: N/A   Occupational History  . 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    Social History Main Topics  . Smoking status: Never Smoker   . Smokeless tobacco: Never Used  . Alcohol Use: No  . Drug Use: No  . Sexual Activity: Not Currently     Comment: lives with husband, no major dietary restrictions   Other Topics Concern  . None   Social History Narrative   Lives in [place] with her husband.  She does not routinely exercise or adhere to any particular diet.      Allergies:  Allergies  Allergen Reactions  . Sulfonamide Derivatives Other (See Comments)    Unknown; "childhood allergy/mother"    Objective:    Vital Signs:   Temp:  [97.5 F (36.4 C)-99.2 F (37.3 C)] 98.2 F (36.8 C) (06/15 1343) Pulse Rate:  [82-138] 82 (06/15 1343) Resp:  [18-20] 18 (06/15 1343) BP: (112-125)/(63-70) 123/63 mmHg (06/15 1343) SpO2:  [87 %-94 %] 94 % (06/15 1343) Weight:  [271 lb 2.7 oz (123 kg)] 271 lb 2.7 oz (123 kg) (06/15 0510) Last BM Date: 10/26/14  Weight change: Filed Weights   10/25/14 0142 10/26/14 0530 10/27/14 0510  Weight: 267 lb 12.8 oz (121.473 kg) 270 lb 8 oz (122.698 kg) 271 lb 2.7 oz (123 kg)    Intake/Output:   Intake/Output Summary (Last 24 hours) at 10/27/14 1354 Last data filed at 10/27/14 1100  Gross per 24 hour  Intake    440 ml  Output   1500 ml  Net  -1060 ml     Physical Exam: General:  Well appearing. No resp difficulty. Obese. HEENT: normal Neck: supple. JVP 6-7, but difficult to assess 2/2 weight. Carotids 2+ bilat; no bruits. No lymphadenopathy or thryomegaly appreciated. Cor: PMI nondisplaced. Regular rate & rhythm. No rubs, gallops or murmurs appreciated. Lungs: Mild bibasilar rales. Abdomen: soft, nontender, nondistended. No hepatosplenomegaly. No bruits or masses. Good bowel sounds. Extremities: no cyanosis, clubbing, rash. 1+ edema to knees. Neuro: alert & orientedx3, cranial nerves grossly intact. moves all 4 extremities w/o  difficulty. Affect pleasant  Telemetry: Paced 80-90s with PVCs.  Labs: Basic Metabolic Panel:  Recent Labs Lab 10/24/14 2232 10/25/14 0453 10/25/14 0904 10/26/14 0305 10/27/14 0717  NA 141 138  --  136 139  K 5.0 5.9* 6.0* 5.2* 4.6  CL 105 105  --  103 102  CO2 25 25  --  24 27  GLUCOSE 50* 175*  --  324* 62*  BUN 71* 70*  --  74* 64*  CREATININE 2.97* 2.98*  --  2.93* 2.42*  CALCIUM 8.9 8.5*  --  8.4* 8.8*    Liver Function Tests:  Recent Labs Lab 10/24/14 2233 10/25/14 0453  AST 14* 14*  ALT 12* 12*  ALKPHOS 37* 36*  BILITOT 0.6 0.6  PROT 7.2 7.0  ALBUMIN 3.5 3.7   No results for input(s): LIPASE, AMYLASE in the last 168 hours. No results for input(s): AMMONIA in the last 168 hours.  CBC:  Recent Labs Lab 10/24/14 2232 10/25/14 0453 10/26/14 0305  WBC 9.6 7.8 9.8  NEUTROABS 7.0 6.8  --   HGB 10.2* 10.3* 9.3*  HCT  31.9* 32.6* 29.0*  MCV 91.4 92.1 90.3  PLT 218 196 192    Cardiac Enzymes:  Recent Labs Lab 10/24/14 2232 10/25/14 0453 10/25/14 1157 10/25/14 1810  TROPONINI <0.03 <0.03 <0.03 <0.03    BNP: BNP (last 3 results)  Recent Labs  07/28/14 2048 09/25/14 2159 10/24/14 2233  BNP 353.9* 237.5* 215.2*    ProBNP (last 3 results) No results for input(s): PROBNP in the last 8760 hours.   CBG:  Recent Labs Lab 10/26/14 1655 10/26/14 2132 10/27/14 0803 10/27/14 0840 10/27/14 1156  GLUCAP 215* 173* 67 92 134*    Coagulation Studies:  Recent Labs  10/24/14 2233 10/25/14 0453 10/26/14 0305 10/27/14 0717  LABPROT 29.5* 27.5* 31.9* 32.9*  INR 2.86* 2.61* 3.17* 3.31*    Other results:   Imaging:  ECHO 10/20/14  LV EF: 25% -  30%  ------------------------------------------------------------------- Indications:   CHF - 428.0.  ------------------------------------------------------------------- History:  PMH:  Congestive heart failure. Risk factors: Diabetes  mellitus.  ------------------------------------------------------------------- Study Conclusions  - Left ventricle: The cavity size was mildly dilated. There was mild concentric hypertrophy. Systolic function was severely reduced. The estimated ejection fraction was in the range of 25% to 30%. Wall motion was normal; there were no regional wall motion abnormalities. Doppler parameters are consistent with restrictive physiology, indicative of decreased left ventricular diastolic compliance and/or increased left atrial pressure. - Ventricular septum: Septal motion showed paradox. - Aortic valve: Trileaflet; mildly thickened, mildly calcified leaflets. Valve mobility was restricted. Sclerosis without stenosis. - Aortic root: The aortic root was normal in size. - Mitral valve: Calcified annulus. Mildly thickened leaflets . The findings are consistent with mild stenosis. There was mild regurgitation. - Left atrium: The atrium was moderately dilated. - Right ventricle: The cavity size was normal. Wall thickness was normal. Systolic function was moderately reduced. - Right atrium: The atrium was mildly dilated. - Pulmonary arteries: Systolic pressure was mildly increased. PA peak pressure: 37 mm Hg (S). - Pericardium, extracardiac: There was no pericardial effusion.  Impressions:  - Compared to the prior study from 09/07/2013 the LVEF is now severely reduced estimated at 25-30%. Endocardium is poorly visualized and regional wall motion can&'t be evaluated. There is a highly mobile echodensity measuring 17 x 3 mm attached to the papillary muscle chordae that is unchanged from the prior study. Mild mitral stenosis and regurgitation. Moderate TR, mild pulmonary hypertension.  Medications:     Current Medications: . albuterol      . atorvastatin  20 mg Oral Daily  . carvedilol  25 mg Oral BID WC  . escitalopram  10 mg Oral Daily  . gabapentin   300 mg Oral BID  . insulin NPH Human  10 Units Subcutaneous QHS  . [START ON 10/28/2014] insulin NPH Human  35 Units Subcutaneous QAC breakfast  . levothyroxine  25 mcg Oral QAC breakfast  . sodium chloride  3 mL Intravenous Q12H  . Warfarin - Pharmacist Dosing Inpatient   Does not apply q1800     Infusions:      Assessment/Plan   1. Acute on chronic respiratory failure - Improved with diuresis.  Presented with SOB and cough. No fever, chills, cough, leukocytosis.- On DuoNeb, Solu-Medrol, flutter valve and Lasix. 2. Acute on chronic combined CHF- LV EF 25-30% by Echo 10/20/14 as above. Decreased LV diastolic compliance. Getting IV lasix. On Coreg. 3. Atrial fibrillation - Paced, coumadin, Dosing per IP pharm.  Last INR 3.31. CHA2DS2-VASc 5 and unadjusted Ischemic Stroke Rate 7.2 % stroke  rate/year 4. Hypothyroidism - Synthroid per primary team 5. DM2 - Per primary team.    Much improved since admit per pt and wants to go home. However weight still up about 5-10 pounds despite diuresis.  She feels comfortable managing her home diuretics. Was taking 40 mg of Demadex daily, with an extra 20 mg prn for weight gain.  Cr improved from 2.9 -> 2.4 today with diuresis. K 4.6.   Will consult MD for optimal treatment.  Length of Stay: 2  Shirley Friar  PA-C  10/27/2014, 1:54 PM  Advanced Heart Failure Team Pager [phone] (M-F; 7a - 4p)  Please contact [facility] for night-coverage after hours (4p -7a ) and weekends on [email or web address]   Patient seen and examined with Oda Kilts, PA-C. We discussed all aspects of the encounter. I agree with the assessment and plan as stated above.   Feeling better after diuresis but weight still up from baseline. Hard to assess volume status on exam due to size. Will continue IV lasix tonight and see how she responds. Continue coumadin. Hopefully home in am.   Bensimhon, Daniel,MD 4:27 PM

## 2014-10-28 LAB — BASIC METABOLIC PANEL
Anion gap: 11 (ref 5–15)
BUN: 69 mg/dL — ABNORMAL HIGH (ref 6–20)
CALCIUM: 8.6 mg/dL — AB (ref 8.9–10.3)
CO2: 29 mmol/L (ref 22–32)
Chloride: 97 mmol/L — ABNORMAL LOW (ref 101–111)
Creatinine, Ser: 2.5 mg/dL — ABNORMAL HIGH (ref 0.44–1.00)
GFR, EST AFRICAN AMERICAN: 21 mL/min — AB (ref >60)
GFR, EST NON AFRICAN AMERICAN: 18 mL/min — AB (ref >60)
GLUCOSE: 177 mg/dL — AB (ref 65–99)
POTASSIUM: 4.6 mmol/L (ref 3.5–5.1)
Sodium: 137 mmol/L (ref 135–145)

## 2014-10-28 LAB — GLUCOSE, CAPILLARY
GLUCOSE-CAPILLARY: 243 mg/dL — AB (ref 65–99)
GLUCOSE-CAPILLARY: 316 mg/dL — AB (ref 65–99)
Glucose-Capillary: 187 mg/dL — ABNORMAL HIGH (ref 65–99)
Glucose-Capillary: 266 mg/dL — ABNORMAL HIGH (ref 65–99)

## 2014-10-28 LAB — PROTIME-INR
INR: 3.35 — AB (ref 0.00–1.49)
PROTHROMBIN TIME: 33.3 s — AB (ref 11.6–15.2)

## 2014-10-28 MED ORDER — INSULIN ASPART 100 UNIT/ML ~~LOC~~ SOLN
0.0000 [IU] | Freq: Three times a day (TID) | SUBCUTANEOUS | Status: DC
  Administered 2014-10-28: 11 [IU] via SUBCUTANEOUS
  Administered 2014-10-29: 2 [IU] via SUBCUTANEOUS

## 2014-10-28 MED ORDER — INSULIN ASPART 100 UNIT/ML ~~LOC~~ SOLN
0.0000 [IU] | Freq: Every day | SUBCUTANEOUS | Status: DC
  Administered 2014-10-28: 3 [IU] via SUBCUTANEOUS

## 2014-10-28 MED ORDER — INSULIN ASPART 100 UNIT/ML ~~LOC~~ SOLN: 0.0000 [IU] | Freq: Three times a day (TID) | SUBCUTANEOUS | Status: DC

## 2014-10-28 NOTE — Progress Notes (Signed)
Results for Arabie, QUANTISHA MARSICANO (MRN [id number]) as of 10/28/2014 13:42  Ref. Range 10/27/2014 11:56 10/27/2014 17:07 10/27/2014 21:21 10/28/2014 08:06 10/28/2014 12:32  Glucose-Capillary Latest Ref Range: 65-99 mg/dL 134 (H) 187 (H) 293 (H) 187 (H) 243 (H)  If CBGs continue to be greater than 180 mg/dl, recommend adding Novolog SENSITIVE correction scale TID along with NPH doses. Will continue to follow while in hospital. Harvel Ricks RN BSN CDE

## 2014-10-28 NOTE — Progress Notes (Signed)
Patient has home CPAP in room that she places on herself when ready for bed.

## 2014-10-28 NOTE — Progress Notes (Signed)
Advanced Heart Failure Rounding Note  PCP: Penni Homans, MD   Subjective:    In very good spirits today.  Says she has been "peeing non stop" with the lasix. Feels much better from admission, and says she slept really well.   Objective:   Weight Range: 271 lb 2.7 oz (123 kg) Body mass index is 46.52 kg/(m^2).   Vital Signs:   Temp:  [97.8 F (36.6 C)-98.3 F (36.8 C)] 97.8 F (36.6 C) (06/16 0521) Pulse Rate:  [73-75] 73 (06/16 0521) Resp:  [18] 18 (06/16 0521) BP: (103-129)/(54-82) 129/54 mmHg (06/16 0521) SpO2:  [94 %-95 %] 94 % (06/16 0521) Weight:  [271 lb 2.7 oz (123 kg)] 271 lb 2.7 oz (123 kg) (06/16 0500) Last BM Date: 10/26/14  Weight change: Filed Weights   10/26/14 0530 10/27/14 0510 10/28/14 0500  Weight: 270 lb 8 oz (122.698 kg) 271 lb 2.7 oz (123 kg) 271 lb 2.7 oz (123 kg)    Intake/Output:   Intake/Output Summary (Last 24 hours) at 10/28/14 1415 Last data filed at 10/28/14 1240  Gross per 24 hour  Intake    733 ml  Output   3250 ml  Net  -2517 ml     Physical Exam: General: Well appearing. No resp difficulty. Obese. HEENT: normal Neck: supple. JVP 5-6, but difficult to assess 2/2 weight. Carotids 2+ bilat; no bruits. No lymphadenopathy or thryomegaly appreciated. Cor: PMI nondisplaced. Regular rate & rhythm. No rubs, gallops or murmurs appreciated. Lungs: Mild bibasilar rales. Abdomen: soft, nontender, nondistended. No hepatosplenomegaly. No bruits or masses. Good bowel sounds. Extremities: no cyanosis, clubbing, rash. 0-1+ edema to knees. Neuro: alert & orientedx3, cranial nerves grossly intact. moves all 4 extremities w/o difficulty. Affect pleasant   Telemetry: Paced 80-90s with PVCs  Labs: CBC  Recent Labs  10/26/14 0305  WBC 9.8  HGB 9.3*  HCT 29.0*  MCV 90.3  PLT 259   Basic Metabolic Panel  Recent Labs  10/27/14 0717 10/28/14 0540  NA 139 137  K 4.6 4.6  CL 102 97*  CO2 27 29  GLUCOSE 62* 177*  BUN 64* 69*  CALCIUM  8.8* 8.6*   Liver Function Tests No results for input(s): AST, ALT, ALKPHOS, BILITOT, PROT, ALBUMIN in the last 72 hours. No results for input(s): LIPASE, AMYLASE in the last 72 hours. Cardiac Enzymes  Recent Labs  10/25/14 1810  TROPONINI <0.03    BNP: BNP (last 3 results)  Recent Labs  07/28/14 2048 09/25/14 2159 10/24/14 2233  BNP 353.9* 237.5* 215.2*    ProBNP (last 3 results) No results for input(s): PROBNP in the last 8760 hours.   D-Dimer No results for input(s): DDIMER in the last 72 hours. Hemoglobin A1C No results for input(s): HGBA1C in the last 72 hours. Fasting Lipid Panel No results for input(s): CHOL, HDL, LDLCALC, TRIG, CHOLHDL, LDLDIRECT in the last 72 hours. Thyroid Function Tests No results for input(s): TSH, T4TOTAL, T3FREE, THYROIDAB in the last 72 hours.  Invalid input(s): FREET3  Other results:     Imaging/Studies:  Dg Chest 2 View  10/27/2014   CLINICAL DATA:  Shortness of breath. Dyspnea. Congestive heart failure. Coronary artery disease. Atrial fibrillation.  EXAM: CHEST  2 VIEW  COMPARISON:  10/24/2014  FINDINGS: Cardiomegaly remains stable. Pacemaker remains in place and prior CABG again noted.  Diffuse interstitial infiltrates remain stable, suspicious for mild interstitial edema. Opacity in both lung bases has a more streaky linear appearance on today's study favoring atelectasis over airspace  disease. No evidence of pleural effusion.  IMPRESSION: Stable cardiomegaly and diffuse pulmonary edema, consistent with congestive heart failure.  Probable bibasilar atelectasis.   Electronically Signed   By: Earle Gell M.D.   On: 10/27/2014 13:52     Latest Echo  Latest Cath   Medications:     Scheduled Medications: . atorvastatin  20 mg Oral Daily  . carvedilol  25 mg Oral BID WC  . escitalopram  10 mg Oral Daily  . furosemide  60 mg Intravenous Q12H  . gabapentin  300 mg Oral BID  . insulin NPH Human  10 Units Subcutaneous QHS   . insulin NPH Human  35 Units Subcutaneous QAC breakfast  . levothyroxine  25 mcg Oral QAC breakfast  . sodium chloride  3 mL Intravenous Q12H  . Warfarin - Pharmacist Dosing Inpatient   Does not apply q1800     Infusions:     PRN Medications:  sodium chloride, acetaminophen, levalbuterol, LORazepam, ondansetron (ZOFRAN) IV, sodium chloride   Assessment/Plan   1. Acute on chronic respiratory failure - Improved with diuresis. Presented with SOB and cough. No fever, chills, cough, leukocytosis.- On DuoNeb, Solu-Medrol, flutter valve and Lasix. 2. Acute on chronic combined CHF- LV EF 25-30% by Echo 10/20/14 as above. Decreased LV diastolic compliance. Getting IV lasix. On Coreg. 3. Atrial fibrillation - Paced, coumadin, Dosing per IP pharm. Last INR 3.31. CHA2DS2-VASc 5 and unadjusted Ischemic Stroke Rate 7.2 % stroke rate/year 4. Hypothyroidism - Synthroid per primary team 5. DM2 - Per primary team.     Weight the showing the same despite output of 2.2 L yesterday. Will have standing weight obtained.  Cont coumadin and IV lasix.  Will consult MD ref disposition. Looking much better today and may be able to go home this evening vs tomorrow morning with small amount of fluid still on board.   Length of Stay: 3   Shirley Friar PA-C 10/28/2014, 2:15 PM  Advanced Heart Failure Team Pager [phone] (M-F; 7a - 4p)  Please contact [facility] for night-coverage after hours (4p -7a ) and weekends on [email or web address]  Patient seen and examined with Oda Kilts, PA-C. We discussed all aspects of the encounter. I agree with the assessment and plan as stated above.   Improving with diuresis. Volume status still mildly elevated. Hopefully she can go home tomorrow after am IV lasix.   Nathaneil Feagans,MD 7:06 PM

## 2014-10-28 NOTE — Progress Notes (Signed)
[facility] PROGRESS NOTE  Destiny [name] JJO:[id number] DOB: Oct 03, 1940 DOA: 10/24/2014  PCP: Penni Homans, MD  Brief HPI: 75 year old Caucasian female with a past medical history of chronic combined systolic and diastolic congestive heart failure with EF of 25-30% on recent echocardiogram, pacemaker placement, coronary artery disease, chronic kidney disease, diabetes mellitus type 2, obstructive sleep apnea on C Pap presented with complaints of shortness of breath. She was noted to have pulmonary edema on chest x-ray. She was given intravenous Lasix and was hospitalized for further management. She had a recent hospitalization for same problem a few weeks earlier.  Past medical history:  Past Medical History  Diagnosis Date  . LBBB (left bundle branch block)   . Carotid stenosis     a. 10/19/2011 carotid duplex - Mild hard plaque bilaterally. Stable 40-59% bilateral ICA stenosis. Carotid US (04/2013):  Bilateral 40-59% ICA.  F/u 1 year  . Dyslipidemia   . Hypertension   . Chronic combined systolic and diastolic CHF (congestive heart failure)     a. EF as low as 25% in 2006;  b. EF 60-65% in 12/2011;  c. 02/2013 Echo: EF 35-40, mod mid-dist antsept HK, Gr 2 DD, mild LVH.  . Back pain, chronic     "just when I walk; mass on 3rd and 4th vertebrae right lower back"  . Cardiomyopathy, ischemic     a. 2012 s/p SJM 3231-40 Uni BiV ICD, ser # [id number].  Marland Kitchen CAD (coronary artery disease)     a. 05/2001 CABG x4: LIMA->LAD, VG->D1, VG->D2, VG->RCA.  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 CKD (chronic kidney disease), stage III   . Biventricular implantable cardiac defibrillator in situ 2007, 2012    a. 2007;  b. 2012 Gen change: SJM 3231-40 Uni BiV ICD, ser # [id number].  Marland Kitchen Hypoxemia requiring supplemental oxygen   . Candidiasis of vulva and vagina   . Hypothyroidism   . 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 Benay Spice, in  remission  . History of pneumonia 12/27/11    "3 times; it's been a long time ago"  . OSA on CPAP   . Gout ~ 08/2011  . PAF (paroxysmal atrial fibrillation)     on Coumadin  . DM (diabetes mellitus), type 2 with complications     insulin dependent, retinopathy, neuropathy  . Mural thrombus of left ventricle     before 2003, while on Coumadin, No h/o CVA  . Morbid obesity with BMI of 45.0-49.9, adult     Ht. 5'4". BMI 47.2  . Vitamin D deficiency 06/09/2012    historical  . Olecranon bursitis of right elbow 12/29/2012  . History of recurrent UTIs   . Diabetes 05/24/2013  . Hypercalcemia 09/26/2013  . Anemia, unspecified 12/13/2013  . Renal insufficiency 03/14/2014    Following with [facility], Dr Loyal Buba  . Medicare annual wellness visit, subsequent 09/12/2014    Medication: Reviewed with patient and updated  Preferred Pharmacy and which med where: 90 day supply/mail order: [facility]: [place]  DM supplies: [facility]   Allergies verified: UTD   Immunization Status: Flu vaccine-- 01/12/14 Tdap-- 01/28/12 PNA-- 03/11/13 (23)  Shingles--08/19/07  MMG-- 06/25/08 with Johnnette Gourd, MD at [facility]    Consultants: Heart failure team  Procedures: None  Antibiotics: None  Subjective: Patient continues to feel better. Denies any chest pain. Breathing is much improved.  Objective: Vital Signs  Filed Vitals:   10/27/14 1343 10/27/14 2123 10/28/14 0500 10/28/14 0521  BP: 123/63 103/82  129/54  Pulse: 82 75  73  Temp: 98.2 F (36.8 C) 98.3 F (36.8 C)  97.8 F (36.6 C)  TempSrc: Oral Oral  Oral  Resp: 18 18  18   Height:      Weight:   123 kg (271 lb 2.7 oz)   SpO2: 94% 95%  94%    Intake/Output Summary (Last 24 hours) at 10/28/14 0955 Last data filed at 10/28/14 3536  Gross per 24 hour  Intake    523 ml  Output   3250 ml  Net  -2727 ml   Filed Weights   10/26/14 0530 10/27/14 0510 10/28/14 0500  Weight:  122.698 kg (270 lb 8 oz) 123 kg (271 lb 2.7 oz) 123 kg (271 lb 2.7 oz)    General appearance: alert, cooperative, appears stated age, no distress and moderately obese Resp: Fine crackles present at the bases but much less than yesterday. No wheezing. No rhonchi. Cardio: S1, S2 is irregularly irregular. No S3, S4. No rubs, murmurs, or bruit. Minimal pedal edema.  GI: soft, non-tender; bowel sounds normal; no masses,  no organomegaly Extremities: Minimal pedal edema bilaterally Neurologic: No focal deficits  Lab Results:  Basic Metabolic Panel:  Recent Labs Lab 10/24/14 2232 10/25/14 0453 10/25/14 0904 10/26/14 0305 10/27/14 0717 10/28/14 0540  NA 141 138  --  136 139 137  K 5.0 5.9* 6.0* 5.2* 4.6 4.6  CL 105 105  --  103 102 97*  CO2 25 25  --  24 27 29   GLUCOSE 50* 175*  --  324* 62* 177*  BUN 71* 70*  --  74* 64* 69*  CREATININE 2.97* 2.98*  --  2.93* 2.42* 2.50*  CALCIUM 8.9 8.5*  --  8.4* 8.8* 8.6*   Liver Function Tests:  Recent Labs Lab 10/24/14 2233 10/25/14 0453  AST 14* 14*  ALT 12* 12*  ALKPHOS 37* 36*  BILITOT 0.6 0.6  PROT 7.2 7.0  ALBUMIN 3.5 3.7   CBC:  Recent Labs Lab 10/24/14 2232 10/25/14 0453 10/26/14 0305  WBC 9.6 7.8 9.8  NEUTROABS 7.0 6.8  --   HGB 10.2* 10.3* 9.3*  HCT 31.9* 32.6* 29.0*  MCV 91.4 92.1 90.3  PLT 218 196 192   Cardiac Enzymes:  Recent Labs Lab 10/24/14 2232 10/25/14 0453 10/25/14 1157 10/25/14 1810  TROPONINI <0.03 <0.03 <0.03 <0.03   BNP (last 3 results)  Recent Labs  07/28/14 2048 09/25/14 2159 10/24/14 2233  BNP 353.9* 237.5* 215.2*    CBG:  Recent Labs Lab 10/27/14 0840 10/27/14 1156 10/27/14 1707 10/27/14 2121 10/28/14 0806  GLUCAP 92 134* 187* 293* 187*    Recent Results (from the past 240 hour(s))  MRSA PCR Screening     Status: None   Collection Time: 10/25/14  3:19 AM  Result Value Ref Range Status   MRSA by PCR NEGATIVE NEGATIVE Final    Comment:        The GeneXpert MRSA  Assay (FDA approved for NASAL specimens only), is one component of a comprehensive MRSA colonization surveillance program. It is not intended to diagnose MRSA infection nor to guide or monitor treatment for MRSA infections.   Culture, blood (routine x 2) Call MD if unable to obtain prior to antibiotics being given     Status: None (Preliminary result)   Collection Time: 10/25/14  4:05 AM  Result Value Ref  Range Status   Specimen Description BLOOD LEFT HAND  Final   Special Requests BOTTLES DRAWN AEROBIC ONLY 10CC  Final   Culture   Final           BLOOD CULTURE RECEIVED NO GROWTH TO DATE CULTURE WILL BE HELD FOR 5 DAYS BEFORE ISSUING A FINAL NEGATIVE REPORT Performed at [facility]    Report Status PENDING  Incomplete  Culture, blood (routine x 2) Call MD if unable to obtain prior to antibiotics being given     Status: None (Preliminary result)   Collection Time: 10/25/14  4:05 AM  Result Value Ref Range Status   Specimen Description BLOOD RIGHT HAND  Final   Special Requests BOTTLES DRAWN AEROBIC ONLY 3CC  Final   Culture   Final           BLOOD CULTURE RECEIVED NO GROWTH TO DATE CULTURE WILL BE HELD FOR 5 DAYS BEFORE ISSUING A FINAL NEGATIVE REPORT Performed at [facility]    Report Status PENDING  Incomplete      Studies/Results: Dg Chest 2 View  10/27/2014   CLINICAL DATA:  Shortness of breath. Dyspnea. Congestive heart failure. Coronary artery disease. Atrial fibrillation.  EXAM: CHEST  2 VIEW  COMPARISON:  10/24/2014  FINDINGS: Cardiomegaly remains stable. Pacemaker remains in place and prior CABG again noted.  Diffuse interstitial infiltrates remain stable, suspicious for mild interstitial edema. Opacity in both lung bases has a more streaky linear appearance on today's study favoring atelectasis over airspace disease. No evidence of pleural effusion.  IMPRESSION: Stable cardiomegaly and diffuse pulmonary edema, consistent with congestive heart failure.   Probable bibasilar atelectasis.   Electronically Signed   By: Earle Gell M.D.   On: 10/27/2014 13:52    Medications:  Scheduled: . atorvastatin  20 mg Oral Daily  . carvedilol  25 mg Oral BID WC  . escitalopram  10 mg Oral Daily  . furosemide  60 mg Intravenous Q12H  . gabapentin  300 mg Oral BID  . insulin NPH Human  10 Units Subcutaneous QHS  . insulin NPH Human  35 Units Subcutaneous QAC breakfast  . levothyroxine  25 mcg Oral QAC breakfast  . sodium chloride  3 mL Intravenous Q12H  . Warfarin - Pharmacist Dosing Inpatient   Does not apply q1800   Continuous:  IHK:VQQVZD chloride, acetaminophen, levalbuterol, LORazepam, ondansetron (ZOFRAN) IV, sodium chloride  Assessment/Plan:  Principal Problem:   Acute and chronic respiratory failure (acute-on-chronic) Active Problems:   NHL (non-Hodgkin's lymphoma)   Diabetes mellitus type 2 with retinopathy   Essential hypertension   ATRIAL FIBRILLATION, PAROXYSMAL   Automatic implantable cardioverter-defibrillator in situ   OSA (obstructive sleep apnea)   Hypothyroidism   Anemia   Type 2 diabetes mellitus, controlled, with renal complications   Morbid obesity   Acute on chronic combined systolic and diastolic congestive heart failure   Hypoglycemia    Acute on chronic respiratory failure Most likely secondary to acute systolic congestive heart failure. Patient improved with Lasix, but then had a an episode of decompensation overnight on 6/14. She has improved with Lasix. Initially she was started on nebulizer treatments and steroids, which have been discontinued. Infectious etiology is unlikely.   Acute on chronic combined CHF As mentioned above SOB, x-ray consistent with CHF. EF based on recent echocardiogram is 25-30%. This is patient's second hospitalization within the last 4 weeks for similar complaints. Despite being on adequate treatment she is developing fluid overload. Her failure team  was consulted and they're following  closely. Patient is still not down to her dry weight. Continue with diuresis. She is also noted to be tachycardic with persistent A. fib. This could could have contributed to her presentation. Amiodarone was started recently, but was discontinued at the time of admission as it was felt that it could've contributed to her symptoms, though it is less likely to have done so. Will defer further management to cardiology. Continue IV Lasix. Continue beta blocker. No ACE inhibitor's due to chronic kidney disease. Repeat chest x-ray is pending. Strict ins and outs. Daily weights.  Hypothyroidism Continue Synthroid.  Hypoglycemia in the setting of type 2 diabetes on insulin with chronic kidney disease Hypoglycemia has resolved. Blood glucose on the hyperglycemic range. HbA1c was 7.2 on May 15. Continue to monitor  Atrial fibrillation with mild RVR Heart rate is better today. She was recently started on amiodarone by her cardiologist, which was discontinued at the time of admission. Cardiology to address further. She has a paced rhythm, on Coumadin. This patients CHA2DS2-VASc Score and unadjusted Ischemic Stroke Rate (% per year) is equal to 7.2 % stroke rate/year from a score of 5 for CHF, HTN, DM, age and female gender.   CKD stage III Creatinine is close to baseline. Hyperkalemia has been corrected.   DVT Prophylaxis: On warfarin    Code Status: Full code  Family Communication: Discussed with the patient and her husband  Disposition Plan: Will likely return home when improved. PT and OT to evaluate. Await diuresis to Dry weight. Possible discharge tomorrow.  Follow-up Appointment?: Will need close follow-up with heart failure team within 7-10 days of discharge.   LOS: 3 days   [facility] Pager [phone] 10/28/2014, 9:55 AM  If 7PM-7AM, please contact night-coverage at [email or web address], password [facility]

## 2014-10-28 NOTE — Progress Notes (Signed)
Requested home health RN order with F2F from Dr Maryland Pink.

## 2014-10-28 NOTE — Progress Notes (Signed)
ANTICOAGULATION CONSULT NOTE - Follow Up Consult  Pharmacy Consult for Coumadin Indication: atrial fibrillation  Allergies  Allergen Reactions  . Sulfonamide Derivatives Other (See Comments)    Unknown; "childhood allergy/mother"    Patient Measurements: Height: 5\' 4"  (162.6 cm) Weight: 271 lb 2.7 oz (123 kg) IBW/kg (Calculated) : 54.7  Vital Signs: Temp: 97.8 F (36.6 C) (06/16 0521) Temp Source: Oral (06/16 0521) BP: 129/54 mmHg (06/16 0521) Pulse Rate: 73 (06/16 0521)  Labs:  Recent Labs  10/25/14 1157 10/25/14 1810 10/26/14 0305 10/27/14 0717 10/28/14 0540  HGB  --   --  9.3*  --   --   HCT  --   --  29.0*  --   --   PLT  --   --  192  --   --   LABPROT  --   --  31.9* 32.9* 33.3*  INR  --   --  3.17* 3.31* 3.35*  CREATININE  --   --  2.93* 2.42* 2.50*  TROPONINI <0.03 <0.03  --   --   --     Estimated Creatinine Clearance: 26.3 mL/min (by C-G formula based on Cr of 2.5).   Medications:  Scheduled:  . atorvastatin  20 mg Oral Daily  . carvedilol  25 mg Oral BID WC  . escitalopram  10 mg Oral Daily  . furosemide  60 mg Intravenous Q12H  . gabapentin  300 mg Oral BID  . insulin NPH Human  10 Units Subcutaneous QHS  . insulin NPH Human  35 Units Subcutaneous QAC breakfast  . levothyroxine  25 mcg Oral QAC breakfast  . sodium chloride  3 mL Intravenous Q12H  . Warfarin - Pharmacist Dosing Inpatient   Does not apply q1800    Assessment: 74 yo F on Coumadin PTA for hx of afib.  INR has been trending up on home dose, last dose given 6/13.  No bleeding noted.    Goal of Therapy:  INR 2-3 Monitor platelets by anticoagulation protocol: Yes   Plan:  No Coumadin tonight. Daily INR.  Uvaldo Rising, BCPS  Clinical Pharmacist Pager [phone]  10/28/2014 7:46 AM

## 2014-10-28 NOTE — Progress Notes (Signed)
Physical Therapy Treatment Patient Details Name: Destiny [name] MRN: [id number] DOB: [birth year] Today's Date: 10/28/2014    History of Present Illness 74 y.o. female admitted with Acute on chronic respiratory failure and Acute on chronic combined CHF    PT Comments    Pt did better walking today - walked longer, improved O2 sats on less O2 and stable heartrate.  Pt educated on respiratory strategies and talked about walking program once home.  Pt with no further questions - she is hoping to go home soon  Follow Up Recommendations  No PT follow up;Supervision - Intermittent     Equipment Recommendations  None recommended by PT    Recommendations for Other Services       Precautions / Restrictions Precautions Precautions: None Precaution Comments: monitor O2 Restrictions Weight Bearing Restrictions: No    Mobility  Bed Mobility                  Transfers Overall transfer level: Modified independent   Transfers: Sit to/from Stand Sit to Stand: Modified independent (Device/Increase time) Stand pivot transfers: Supervision       General transfer comment: stood safely from chair and backed up safely to sit with control  Ambulation/Gait Ambulation/Gait assistance: Supervision Ambulation Distance (Feet): 150 Feet Assistive device: Rolling walker (2 wheeled) (portable O2) Gait Pattern/deviations: Step-through pattern;Wide base of support Gait velocity: decreased with focus on breathing - no loss of balance seen   General Gait Details: focus today on respiratory control - catching her breath before starting, not talking while walking and standing rest break x 2 when needed.  pt knows to breath thorugh her nose and out through her mouth.  It took pt 30 seconds with controlled breathing to return to her baseline   Financial trader [place] (Stroke Patients Only)       Balance                                     Cognition Arousal/Alertness: Awake/alert Behavior During Therapy: WFL for tasks assessed/performed Overall Cognitive Status: Within Functional Limits for tasks assessed                      Exercises Other Exercises Other Exercises: reviewed HEP for home - setting timer (starting at 4 minutes) and doing 3 walks a day - increasing to 6 mintues when that gets easy.  Pt has pulse oximeter to monitor her vitals with ambulation    General Comments        Pertinent Vitals/Pain Pain Assessment: No/denies pain    Home Living                      Prior Function            PT Goals (current goals can now be found in the care plan section) Progress towards PT goals: Progressing toward goals    Frequency       PT Plan Current plan remains appropriate    Co-evaluation             End of Session Equipment Utilized During Treatment: Gait belt;Oxygen Activity Tolerance: Patient tolerated treatment well Patient left: in chair;with call bell/phone within reach     Time: 1535-1600 PT Time Calculation (min) (ACUTE ONLY): 25 min  Charges:  $  Gait Training: 23-37 mins                    G Codes:      Loyal Buba 10/28/2014, 4:13 PM 10/28/2014   Rande Lawman, PT

## 2014-10-28 NOTE — Progress Notes (Addendum)
Spoke with patient. She states that she gets her home O2 from [place]. She uses 2L at home, and that is what she is on now. She will be followed with [facility] after discharge. No further needs at this time. [place] with [facility] notified of DC today.

## 2014-10-28 NOTE — Progress Notes (Signed)
Patient approved for [facility] this week.

## 2014-10-29 LAB — BASIC METABOLIC PANEL
ANION GAP: 10 (ref 5–15)
BUN: 74 mg/dL — ABNORMAL HIGH (ref 6–20)
CALCIUM: 8.8 mg/dL — AB (ref 8.9–10.3)
CHLORIDE: 101 mmol/L (ref 101–111)
CO2: 29 mmol/L (ref 22–32)
CREATININE: 2.28 mg/dL — AB (ref 0.44–1.00)
GFR calc non Af Amer: 20 mL/min — ABNORMAL LOW (ref >60)
GFR, EST AFRICAN AMERICAN: 24 mL/min — AB (ref >60)
Glucose, Bld: 102 mg/dL — ABNORMAL HIGH (ref 65–99)
Potassium: 4.1 mmol/L (ref 3.5–5.1)
SODIUM: 140 mmol/L (ref 135–145)

## 2014-10-29 LAB — PROTIME-INR
INR: 2.65 — ABNORMAL HIGH (ref 0.00–1.49)
Prothrombin Time: 27.9 seconds — ABNORMAL HIGH (ref 11.6–15.2)

## 2014-10-29 LAB — GLUCOSE, CAPILLARY
Glucose-Capillary: 89 mg/dL (ref 65–99)
Glucose-Capillary: 144 mg/dL — ABNORMAL HIGH (ref 65–99)

## 2014-10-29 MED ORDER — WARFARIN SODIUM 7.5 MG PO TABS
7.5000 mg | ORAL_TABLET | Freq: Once | ORAL | Status: DC
  Filled 2014-10-29: qty 1

## 2014-10-29 MED ORDER — FUROSEMIDE 10 MG/ML IJ SOLN
80.0000 mg | Freq: Once | INTRAMUSCULAR | Status: AC
  Administered 2014-10-29: 80 mg via INTRAVENOUS

## 2014-10-29 MED ORDER — METOLAZONE 2.5 MG PO TABS
2.5000 mg | ORAL_TABLET | Freq: Once | ORAL | Status: AC
Start: 2014-10-29 — End: 2014-10-29
  Administered 2014-10-29: 2.5 mg via ORAL
  Filled 2014-10-29: qty 1

## 2014-10-29 MED ORDER — TORSEMIDE 20 MG PO TABS: ORAL_TABLET | ORAL | Status: DC

## 2014-10-29 MED ORDER — FUROSEMIDE 10 MG/ML IJ SOLN: 60.0000 mg | Freq: Once | INTRAMUSCULAR | Status: DC

## 2014-10-29 MED ORDER — WARFARIN SODIUM 5 MG PO TABS: 5.0000 mg | ORAL_TABLET | Freq: Every day | ORAL | Status: DC

## 2014-10-29 NOTE — Consult Note (Signed)
   [facility] CM Inpatient Consult   10/29/2014  KAETLYN NOA 17-Mar-1941 [id number] Referral received to assess for care management services. Explained that [facility] is a covered benefit of Medicare insurance.   Met with the patient and husband,  regarding the benefits of [facility] Care Management services. Patient states she will have home health coming out to see her again. Husband states, "I am not sure how this will be able to help her in that we know what causes her problems which she has had for years.  I am pretty much familiar with her symptoms and her care needs."  Denies issues with follow up appointments, medications, or monitoring.  Review information for [facility] Care Management and a brochure was provided with contact information.  Explained that [facility] does not interfere with or replace any services arranged by the inpatient care management staff.  Patient declined services with [facility], at this time.  Encouraged to call if questions arises and/or if they change their mind. Encouraged to speak with their primary provider for questions as well.  For further questions, please contact: Natividad Brood, RN BSN [facility] Liaison  [phone] business mobile phone

## 2014-10-29 NOTE — Discharge Instructions (Signed)

## 2014-10-29 NOTE — Discharge Summary (Signed)
[facility]  Physician Discharge Summary   Patient ID: DESTINE ZIRKLE MRN: [id number] DOB/AGE: March 15, 1941 74 y.o.  Admit date: 10/24/2014 Discharge date: 10/29/2014  PCP: Penni Homans, MD  DISCHARGE DIAGNOSES:  Principal Problem:   Acute and chronic respiratory failure (acute-on-chronic) Active Problems:   NHL (non-Hodgkin's lymphoma)   Diabetes mellitus type 2 with retinopathy   Essential hypertension   ATRIAL FIBRILLATION, PAROXYSMAL   Automatic implantable cardioverter-defibrillator in situ   OSA (obstructive sleep apnea)   Hypothyroidism   Anemia   Type 2 diabetes mellitus, controlled, with renal complications   Morbid obesity   Acute on chronic combined systolic and diastolic congestive heart failure   Hypoglycemia   RECOMMENDATIONS FOR OUTPATIENT FOLLOW UP: Close follow-up with the heart failure clinic as well as for PT/INR  DISCHARGE CONDITION: fair  Diet recommendation: Modified carbohydrate  Filed Weights   10/28/14 0500 10/28/14 1427 10/29/14 0511  Weight: 123 kg (271 lb 2.7 oz) 120.112 kg (264 lb 12.8 oz) 118.389 kg (261 lb)    INITIAL HISTORY: 74 year old Caucasian female with a past medical history of chronic combined systolic and diastolic congestive heart failure with EF of 25-30% on recent echocardiogram, pacemaker placement, coronary artery disease, chronic kidney disease, diabetes mellitus type 2, obstructive sleep apnea on C Pap presented with complaints of shortness of breath. She was noted to have pulmonary edema on chest x-ray. She was given intravenous Lasix and was hospitalized for further management. She had a recent hospitalization for same problem a few weeks earlier.  Consultations:  Heart failure team  HOSPITAL COURSE:   Acute on chronic respiratory failure Most likely secondary to acute systolic congestive heart failure. Patient improved with Lasix, but then had an episode of decompensation overnight on 6/14. She was placed on  higher doses of Lasix intravenously. She started diuresing well. She has lost a significant amount of weight during this hospitalization. He is very close to her dry weight. Symptomatically, she is much improved. Initially she was started on nebulizer treatments and steroids, which have been discontinued. Infectious etiology is unlikely.   Acute on chronic combined CHF As mentioned above SOB, x-ray consistent with CHF. EF based on recent echocardiogram is 25-30%. This is patient's second hospitalization within the last 4 weeks for similar complaints. Despite being on adequate treatment she was developing fluid overload. Heart failure team was consulted and they made their recommendations. Patient was aggressively diuresed. Initially she was also noted to be tachycardic with persistent A. fib. This could could have contributed to her presentation. Amiodarone was started recently, but was discontinued at the time of admission as it was felt that it could've contributed to her symptoms, though it is less likely to have done so. She'll be discharged today on a higher dose of torsemide. Discussed with Dr. Haroldine Laws. She was also given 1 dose of metolazone. Continue beta blocker. No ACE inhibitor's due to chronic kidney disease.   Hypothyroidism Continue Synthroid.  Hypoglycemia in the setting of type 2 diabetes on insulin with chronic kidney disease Hypoglycemia has resolved. HbA1c was 7.2 on May 15. May resume her home medication regimen.  Atrial fibrillation with mild RVR Heart rate is better. She was recently started on amiodarone by her cardiologist, which was discontinued at the time of admission. Okay to resume amiodarone per cardiology. She has a paced rhythm, on Coumadin. This patients CHA2DS2-VASc Score and unadjusted Ischemic Stroke Rate (% per year) is equal to 7.2 % stroke rate/year from a score of 5 for CHF,  HTN, DM, age and female gender. Due to interaction with amiodarone the dose of warfarin  will be reduced. She will need to have a repeat PT/INR checked next week.  CKD stage III Creatinine is close to baseline. Hyperkalemia has been corrected.  Overall, patient is much better. She has ambulated without any difficulty. Home health has been set up for CHF. Okay for discharge.  PERTINENT LABS:  The results of significant diagnostics from this hospitalization (including imaging, microbiology, ancillary and laboratory) are listed below for reference.    Microbiology: Recent Results (from the past 240 hour(s))  MRSA PCR Screening     Status: None   Collection Time: 10/25/14  3:19 AM  Result Value Ref Range Status   MRSA by PCR NEGATIVE NEGATIVE Final    Comment:        The GeneXpert MRSA Assay (FDA approved for NASAL specimens only), is one component of a comprehensive MRSA colonization surveillance program. It is not intended to diagnose MRSA infection nor to guide or monitor treatment for MRSA infections.   Culture, blood (routine x 2) Call MD if unable to obtain prior to antibiotics being given     Status: None (Preliminary result)   Collection Time: 10/25/14  4:05 AM  Result Value Ref Range Status   Specimen Description BLOOD LEFT HAND  Final   Special Requests BOTTLES DRAWN AEROBIC ONLY 10CC  Final   Culture   Final           BLOOD CULTURE RECEIVED NO GROWTH TO DATE CULTURE WILL BE HELD FOR 5 DAYS BEFORE ISSUING A FINAL NEGATIVE REPORT Performed at [facility]    Report Status PENDING  Incomplete  Culture, blood (routine x 2) Call MD if unable to obtain prior to antibiotics being given     Status: None (Preliminary result)   Collection Time: 10/25/14  4:05 AM  Result Value Ref Range Status   Specimen Description BLOOD RIGHT HAND  Final   Special Requests BOTTLES DRAWN AEROBIC ONLY 3CC  Final   Culture   Final           BLOOD CULTURE RECEIVED NO GROWTH TO DATE CULTURE WILL BE HELD FOR 5 DAYS BEFORE ISSUING A FINAL NEGATIVE REPORT Performed at [facility]    Report Status PENDING  Incomplete     Labs: Basic Metabolic Panel:  Recent Labs Lab 10/25/14 0453 10/25/14 0904 10/26/14 0305 10/27/14 0717 10/28/14 0540 10/29/14 0408  NA 138  --  136 139 137 140  K 5.9* 6.0* 5.2* 4.6 4.6 4.1  CL 105  --  103 102 97* 101  CO2 25  --  24 27 29 29   GLUCOSE 175*  --  324* 62* 177* 102*  BUN 70*  --  74* 64* 69* 74*  CREATININE 2.98*  --  2.93* 2.42* 2.50* 2.28*  CALCIUM 8.5*  --  8.4* 8.8* 8.6* 8.8*   Liver Function Tests:  Recent Labs Lab 10/24/14 2233 10/25/14 0453  AST 14* 14*  ALT 12* 12*  ALKPHOS 37* 36*  BILITOT 0.6 0.6  PROT 7.2 7.0  ALBUMIN 3.5 3.7   CBC:  Recent Labs Lab 10/24/14 2232 10/25/14 0453 10/26/14 0305  WBC 9.6 7.8 9.8  NEUTROABS 7.0 6.8  --   HGB 10.2* 10.3* 9.3*  HCT 31.9* 32.6* 29.0*  MCV 91.4 92.1 90.3  PLT 218 196 192   Cardiac Enzymes:  Recent Labs Lab 10/24/14 2232 10/25/14 0453 10/25/14 1157 10/25/14 1810  TROPONINI <  0.03 <0.03 <0.03 <0.03   BNP: BNP (last 3 results)  Recent Labs  07/28/14 2048 09/25/14 2159 10/24/14 2233  BNP 353.9* 237.5* 215.2*    CBG:  Recent Labs Lab 10/28/14 1232 10/28/14 1655 10/28/14 2118 10/29/14 0822 10/29/14 1214  GLUCAP 243* 316* 266* 89 144*     IMAGING STUDIES Dg Chest 2 View  10/27/2014   CLINICAL DATA:  Shortness of breath. Dyspnea. Congestive heart failure. Coronary artery disease. Atrial fibrillation.  EXAM: CHEST  2 VIEW  COMPARISON:  10/24/2014  FINDINGS: Cardiomegaly remains stable. Pacemaker remains in place and prior CABG again noted.  Diffuse interstitial infiltrates remain stable, suspicious for mild interstitial edema. Opacity in both lung bases has a more streaky linear appearance on today's study favoring atelectasis over airspace disease. No evidence of pleural effusion.  IMPRESSION: Stable cardiomegaly and diffuse pulmonary edema, consistent with congestive heart failure.  Probable bibasilar atelectasis.    Electronically Signed   By: Earle Gell M.D.   On: 10/27/2014 13:52   Dg Chest 2 View  10/05/2014   CLINICAL DATA:  CABG.  EXAM: CHEST  2 VIEW  COMPARISON:  09/25/2014 .  FINDINGS: Mediastinum hilar structures are normal. Cardiac pacer with lead tips in right atrium right ventricle. Prior CABG Cardiomegaly. Pulmonary venous congestion and interstitial prominence consistent with congestive heart failure. Pulmonary edema has cleared significantly from prior exam.  IMPRESSION: Congestive heart failure with pulmonary interstitial edema. Interim significant clearing of pulmonary edema from 09/25/2014   Electronically Signed   By: Orin   On: 10/05/2014 14:55   Dg Chest Portable 1 View  10/24/2014   CLINICAL DATA:  Acute onset of shortness of breath. Initial encounter.  EXAM: PORTABLE CHEST - 1 VIEW  COMPARISON:  Chest radiograph performed 10/05/2014  FINDINGS: The lungs are well-aerated. Vascular congestion is noted. Increased interstitial markings and bibasilar airspace opacities raise concern for mild pulmonary edema. There is no evidence of pleural effusion or pneumothorax.  The cardiomediastinal silhouette is mildly enlarged. The patient is status post median sternotomy. A pacemaker/AICD is noted overlying the left chest wall, with leads ending overlying the right atrium, right ventricle and coronary sinus. No acute osseous abnormalities are seen.  IMPRESSION: Vascular congestion and mild cardiomegaly. Increased interstitial markings and bibasilar airspace opacities raise concern for mild pulmonary edema.   Electronically Signed   By: Garald Balding M.D.   On: 10/24/2014 22:32    DISCHARGE EXAMINATION: Filed Vitals:   10/28/14 1643 10/28/14 2123 10/29/14 0511 10/29/14 0821  BP: 117/54 132/42 115/63 114/63  Pulse: 81 77 75 80  Temp:  98.2 F (36.8 C) 97.6 F (36.4 C)   TempSrc:  Oral Oral   Resp:  18 18   Height:      Weight:   118.389 kg (261 lb)   SpO2:  94% 92%    General  appearance: alert, cooperative and no distress Resp: Diminished air entry at the bases. Much improved from before. In terms of overall air entry. Very few crackles. No wheezing. Cardio: regular rate and rhythm, S1, S2 normal, no murmur, click, rub or gallop GI: soft, non-tender; bowel sounds normal; no masses,  no organomegaly  DISPOSITION: Home  Discharge Instructions    Call MD for:  difficulty breathing, headache or visual disturbances    Complete by:  As directed      Call MD for:  extreme fatigue    Complete by:  As directed      Call MD for:  persistant dizziness or light-headedness    Complete by:  As directed      Call MD for:  persistant nausea and vomiting    Complete by:  As directed      Call MD for:  severe uncontrolled pain    Complete by:  As directed      Call MD for:  temperature >100.4    Complete by:  As directed      Diet Carb Modified    Complete by:  As directed      Discharge instructions    Complete by:  As directed   Please note change in dosage of warfarin. You will need to have your INR checked next week by Tuesday. Continue checking weight daily.  You were cared for by a hospitalist during your hospital stay. If you have any questions about your discharge medications or the care you received while you were in the hospital after you are discharged, you can call the unit and asked to speak with the hospitalist on call if the hospitalist that took care of you is not available. Once you are discharged, your primary care physician will handle any further medical issues. Please note that NO REFILLS for any discharge medications will be authorized once you are discharged, as it is imperative that you return to your primary care physician (or establish a relationship with a primary care physician if you do not have one) for your aftercare needs so that they can reassess your need for medications and monitor your lab values. If you do not have a primary care physician, you  can call [phone] for a physician referral.     Increase activity slowly    Complete by:  As directed            ALLERGIES:  Allergies  Allergen Reactions  . Sulfonamide Derivatives Other (See Comments)    Unknown; "childhood allergy/mother"     Discharge Medication List as of 10/29/2014  2:10 PM    CONTINUE these medications which have CHANGED   Details  torsemide (DEMADEX) 20 MG tablet Take 40mg  in morning and 20mg  in evening. May take an extra tab for 3# weight gain, swelling or SOB, No Print    warfarin (COUMADIN) 5 MG tablet Take 1 tablet (5 mg total) by mouth daily at 6 PM., Starting 10/29/2014, Until Discontinued, No Print      CONTINUE these medications which have NOT CHANGED   Details  acetaminophen (TYLENOL) 500 MG tablet Take 1,000 mg by mouth every 6 (six) hours as needed (pain)., Until Discontinued, Historical Med    albuterol (PROVENTIL HFA;VENTOLIN HFA) 108 (90 BASE) MCG/ACT inhaler Inhale 2 puffs into the lungs every 6 (six) hours as needed for wheezing or shortness of breath., Starting 12/26/2012, Until Discontinued, Normal    amiodarone (PACERONE) 200 MG tablet Take 1 tablet (200 mg total) by mouth 2 (two) times daily., Starting 10/14/2014, Until Discontinued, Normal    atorvastatin (LIPITOR) 20 MG tablet Take 1 tablet (20 mg total) by mouth daily., Starting 08/31/2014, Until Discontinued, Normal    carvedilol (COREG) 25 MG tablet Take 1 tablet by mouth 2 (two) times daily., Starting 07/27/2014, Until Discontinued, Historical Med    escitalopram (LEXAPRO) 10 MG tablet Take 1 tablet (10 mg total) by mouth daily., Starting 08/31/2014, Until Discontinued, Normal    fenofibrate (TRICOR) 48 MG tablet Take 1 tablet (48 mg total) by mouth daily., Starting 07/15/2014, Until Discontinued, Normal    fluticasone (FLONASE) 50 MCG/ACT nasal  spray Place 2 sprays into both nostrils daily as needed for allergies or rhinitis. As needed for nasal stuffiness., Starting 01/13/2014, Until  Discontinued, Normal    gabapentin (NEURONTIN) 300 MG capsule Take 1 capsule (300 mg total) by mouth 2 (two) times daily., Starting 08/31/2014, Until Discontinued, Normal    glucose blood (FREESTYLE LITE) test strip DX: 250.60  Check sugars bid and as needed, Print    insulin NPH (HUMULIN N,NOVOLIN N) 100 UNIT/ML injection Inject 20-45 Units into the skin 2 (two) times daily before a meal. Take 45 units in the morning and 15 units in the evening, Until Discontinued, Historical Med    insulin regular (NOVOLIN R,HUMULIN R) 100 units/mL injection Change to 15 units three times a with meals (to cover all meals--this is the same total amount but spread out), Print    levothyroxine (SYNTHROID, LEVOTHROID) 25 MCG tablet TAKE 1 TABLET EVERY DAY BEFORE BREAKFAST, Normal    LORazepam (ATIVAN) 0.5 MG tablet Take 1 tablet (0.5 mg total) by mouth at bedtime as needed for anxiety., Starting 08/31/2014, Until Discontinued, Print    NON FORMULARY Oxygen---24 hour, Until Discontinued, Historical Med    Vitamin D, Ergocalciferol, (DRISDOL) 50000 UNITS CAPS capsule Take 50,000 Units by mouth every 7 (seven) days. Tuesdays, Until Discontinued, Historical Med      STOP taking these medications     enalapril (VASOTEC) 2.5 MG tablet        Follow-up Information    Follow up with [facility].   Specialty:  Home Health Services   Why:  will call 24-48 hours after discharge.    Contact information:   [place] [address] [phone]       Follow up with Glori Bickers, MD On 11/18/2014.   Specialty:  Cardiology   Why:  at 1200 noon in the [facility]--gate code [id number]--please bring all medications to appt   Contact information:   [place] [address] [phone]       TOTAL DISCHARGE TIME: 35 minutes  [facility] Pager [phone]  10/29/2014, 3:10 PM

## 2014-10-29 NOTE — Progress Notes (Signed)
Reviewed discharge instructions with pt and husband.  Pt denied any needs at this time.  Removed PIV.  Pt taken to discharge location via wheelchair.

## 2014-10-29 NOTE — Progress Notes (Signed)
Advanced Heart Failure Rounding Note  PCP: Penni Homans, MD   Subjective:    Breathing well. Weight down another 3 pounds. Good diuresis.   Objective:   Weight Range: 261 lb (118.389 kg) Body mass index is 44.78 kg/(m^2).   Vital Signs:   Temp:  [97.6 F (36.4 C)-98.2 F (36.8 C)] 97.6 F (36.4 C) (06/17 0511) Pulse Rate:  [75-81] 80 (06/17 0821) Resp:  [18] 18 (06/17 0511) BP: (114-132)/(42-63) 114/63 mmHg (06/17 0821) SpO2:  [92 %-94 %] 92 % (06/17 0511) Weight:  [261 lb (118.389 kg)] 261 lb (118.389 kg) (06/17 0511) Last BM Date: 10/28/14  Weight change: Filed Weights   10/28/14 0500 10/28/14 1427 10/29/14 0511  Weight: 271 lb 2.7 oz (123 kg) 264 lb 12.8 oz (120.112 kg) 261 lb (118.389 kg)    Intake/Output:   Intake/Output Summary (Last 24 hours) at 10/29/14 1450 Last data filed at 10/29/14 1006  Gross per 24 hour  Intake    400 ml  Output   1450 ml  Net  -1050 ml     Physical Exam: General: Sitting in chair. No resp difficulty. Obese. HEENT: normal Neck: supple. JVP 7-8, but difficult to assess 2/2 weight. Carotids 2+ bilat; no bruits. No lymphadenopathy or thryomegaly appreciated. Cor: PMI nondisplaced. Regular rate & rhythm. No rubs, gallops or murmurs appreciated. Lungs: Mild bibasilar rales. Abdomen: soft, nontender, nondistended. No hepatosplenomegaly. No bruits or masses. Good bowel sounds. Extremities: no cyanosis, clubbing, rash. trace edema to knees. Neuro: alert & orientedx3, cranial nerves grossly intact. moves all 4 extremities w/o difficulty. Affect pleasant   Telemetry: Paced 80-90s with PVCs  Labs: CBC No results for input(s): WBC, NEUTROABS, HGB, HCT, MCV, PLT in the last 72 hours. Basic Metabolic Panel  Recent Labs  10/28/14 0540 10/29/14 0408  NA 137 140  K 4.6 4.1  CL 97* 101  CO2 29 29  GLUCOSE 177* 102*  BUN 69* 74*  CALCIUM 8.6* 8.8*   Liver Function Tests No results for input(s): AST, ALT, ALKPHOS, BILITOT, PROT,  ALBUMIN in the last 72 hours. No results for input(s): LIPASE, AMYLASE in the last 72 hours. Cardiac Enzymes No results for input(s): CKTOTAL, CKMB, CKMBINDEX, TROPONINI in the last 72 hours.  BNP: BNP (last 3 results)  Recent Labs  07/28/14 2048 09/25/14 2159 10/24/14 2233  BNP 353.9* 237.5* 215.2*    ProBNP (last 3 results) No results for input(s): PROBNP in the last 8760 hours.   D-Dimer No results for input(s): DDIMER in the last 72 hours. Hemoglobin A1C No results for input(s): HGBA1C in the last 72 hours. Fasting Lipid Panel No results for input(s): CHOL, HDL, LDLCALC, TRIG, CHOLHDL, LDLDIRECT in the last 72 hours. Thyroid Function Tests No results for input(s): TSH, T4TOTAL, T3FREE, THYROIDAB in the last 72 hours.  Invalid input(s): FREET3  Other results:     Imaging/Studies:  No results found.  Latest Echo  Latest Cath   Medications:     Scheduled Medications: . atorvastatin  20 mg Oral Daily  . carvedilol  25 mg Oral BID WC  . escitalopram  10 mg Oral Daily  . gabapentin  300 mg Oral BID  . insulin aspart  0-15 Units Subcutaneous TID WC  . insulin aspart  0-5 Units Subcutaneous QHS  . insulin NPH Human  10 Units Subcutaneous QHS  . insulin NPH Human  35 Units Subcutaneous QAC breakfast  . levothyroxine  25 mcg Oral QAC breakfast  . sodium chloride  3 mL Intravenous  Q12H  . warfarin  7.5 mg Oral ONCE-1800  . Warfarin - Pharmacist Dosing Inpatient   Does not apply q1800    Infusions:    PRN Medications: sodium chloride, acetaminophen, levalbuterol, LORazepam, ondansetron (ZOFRAN) IV, sodium chloride   Assessment/Plan   1. Acute on chronic respiratory failure - Improved with diuresis. Presented with SOB and cough. No fever, chills, cough, leukocytosis.- On DuoNeb, Solu-Medrol, flutter valve and Lasix. 2. Acute on chronic combined CHF- LV EF 25-30% by Echo 10/20/14 as above. Decreased LV diastolic compliance. Getting IV lasix. On Coreg. 3.  Atrial fibrillation - Paced, coumadin, Dosing per IP pharm. Last INR 3.31. CHA2DS2-VASc 5 and unadjusted Ischemic Stroke Rate 7.2 % stroke rate/year 4. Hypothyroidism - Synthroid per primary team 5. DM2 - Per primary team.   Improving with diuresis. Volume status still mildly elevated. Will give one more dose of IV lasix and po metolazone this am and then discharge, Will f/u in HF Clinic next week. D/w Dr [place] Pink.  Bensimhon, Daniel,MD 2:50 PM

## 2014-10-29 NOTE — Progress Notes (Signed)
ANTICOAGULATION CONSULT NOTE - Follow Up Consult  Pharmacy Consult for Coumadin Indication: atrial fibrillation  Allergies  Allergen Reactions  . Sulfonamide Derivatives Other (See Comments)    Unknown; "childhood allergy/mother"    Patient Measurements: Height: 5\' 4"  (162.6 cm) Weight: 261 lb (118.389 kg) IBW/kg (Calculated) : 54.7  Vital Signs: Temp: 97.6 F (36.4 C) (06/17 0511) Temp Source: Oral (06/17 0511) BP: 114/63 mmHg (06/17 0821) Pulse Rate: 80 (06/17 0821)  Labs:  Recent Labs  10/27/14 0717 10/28/14 0540 10/29/14 0408  LABPROT 32.9* 33.3* 27.9*  INR 3.31* 3.35* 2.65*  CREATININE 2.42* 2.50* 2.28*    Estimated Creatinine Clearance: 28.2 mL/min (by C-G formula based on Cr of 2.28).   Medications:  Scheduled:  . atorvastatin  20 mg Oral Daily  . carvedilol  25 mg Oral BID WC  . escitalopram  10 mg Oral Daily  . furosemide  60 mg Intravenous Q12H  . gabapentin  300 mg Oral BID  . insulin aspart  0-15 Units Subcutaneous TID WC  . insulin aspart  0-5 Units Subcutaneous QHS  . insulin NPH Human  10 Units Subcutaneous QHS  . insulin NPH Human  35 Units Subcutaneous QAC breakfast  . levothyroxine  25 mcg Oral QAC breakfast  . sodium chloride  3 mL Intravenous Q12H  . Warfarin - Pharmacist Dosing Inpatient   Does not apply q1800    Assessment: 74 yo F on Coumadin PTA for hx of afib.  INR had been trending up on home dose, last dose given 6/13.  Today's INR back down to 2.65 after holding Coumadin for several days.  No bleeding noted.    PTA Coumadin dose 5 mg daily except 7.5 mg on MWF. (may need dose reduction at discharge)  Goal of Therapy:  INR 2-3 Monitor platelets by anticoagulation protocol: Yes   Plan:  Coumadin 7.5 mg x 1 tonight. Daily INR.  Uvaldo Rising, BCPS  Clinical Pharmacist Pager [phone]  10/29/2014 8:29 AM

## 2014-10-31 LAB — CULTURE, BLOOD (ROUTINE X 2)
CULTURE: NO GROWTH
Culture: NO GROWTH

## 2014-11-01 ENCOUNTER — Telehealth: Payer: Self-pay

## 2014-11-01 ENCOUNTER — Other Ambulatory Visit: Payer: Self-pay | Admitting: Internal Medicine

## 2014-11-01 DIAGNOSIS — Z794 Long term (current) use of insulin: Secondary | ICD-10-CM | POA: Diagnosis not present

## 2014-11-01 DIAGNOSIS — I2510 Atherosclerotic heart disease of native coronary artery without angina pectoris: Secondary | ICD-10-CM | POA: Diagnosis not present

## 2014-11-01 DIAGNOSIS — E1129 Type 2 diabetes mellitus with other diabetic kidney complication: Secondary | ICD-10-CM | POA: Diagnosis not present

## 2014-11-01 DIAGNOSIS — N183 Chronic kidney disease, stage 3 (moderate): Secondary | ICD-10-CM | POA: Diagnosis not present

## 2014-11-01 DIAGNOSIS — C8590 Non-Hodgkin lymphoma, unspecified, unspecified site: Secondary | ICD-10-CM | POA: Diagnosis not present

## 2014-11-01 DIAGNOSIS — I129 Hypertensive chronic kidney disease with stage 1 through stage 4 chronic kidney disease, or unspecified chronic kidney disease: Secondary | ICD-10-CM | POA: Diagnosis not present

## 2014-11-01 DIAGNOSIS — E1140 Type 2 diabetes mellitus with diabetic neuropathy, unspecified: Secondary | ICD-10-CM | POA: Diagnosis not present

## 2014-11-01 DIAGNOSIS — I5043 Acute on chronic combined systolic (congestive) and diastolic (congestive) heart failure: Secondary | ICD-10-CM | POA: Diagnosis not present

## 2014-11-01 DIAGNOSIS — I447 Left bundle-branch block, unspecified: Secondary | ICD-10-CM | POA: Diagnosis not present

## 2014-11-01 DIAGNOSIS — D649 Anemia, unspecified: Secondary | ICD-10-CM | POA: Diagnosis not present

## 2014-11-01 DIAGNOSIS — M549 Dorsalgia, unspecified: Secondary | ICD-10-CM | POA: Diagnosis not present

## 2014-11-01 DIAGNOSIS — I4891 Unspecified atrial fibrillation: Secondary | ICD-10-CM | POA: Diagnosis not present

## 2014-11-01 DIAGNOSIS — E785 Hyperlipidemia, unspecified: Secondary | ICD-10-CM | POA: Diagnosis not present

## 2014-11-01 NOTE — Telephone Encounter (Signed)
Admit date: 10/24/2014 Discharge date: 10/29/2014  PCP: Penni Homans, MD  Reason for admission:  Acute and chronic respiratory failure (acute-on-chronic)  RECOMMENDATIONS FOR OUTPATIENT FOLLOW UP: Close follow-up with the heart failure clinic as well as for PT/INR  Hospital follow up appointment:  11/04/14 @ 10 am with Dr. Charlett Blake.     Transition Care Management Follow-up Telephone Call  How have you been since you were released from the hospital?  Pt states she is much improved.  Weighing daily.  No significant weight gain noted. Denies chest pain, shortness of breath, swelling, fever, lightheadedness, n/v, visual changes, or severe pain.   Do you understand why you were in the hospital? yes   Do you understand the discharge instructions? yes  Items Reviewed:  Medications reviewed: yes  Allergies reviewed: yes  Dietary changes reviewed: yes, carb modified  Referrals reviewed: yes, per pt--heart failure and coumadin clinic appts scheduled.     Functional Questionnaire:   Activities of Daily Living (ADLs):   She states they are independent in the following: ambulation, bathing and hygiene, feeding, continence, grooming, toileting and dressing States they require assistance with the following: husband there to assist as needed.   Any transportation issues/concerns?: no   Any patient concerns? no   Confirmed importance and date/time of follow-up visits scheduled: yes   Confirmed with patient if condition begins to worsen call PCP or go to the ER: yes

## 2014-11-04 ENCOUNTER — Ambulatory Visit (INDEPENDENT_AMBULATORY_CARE_PROVIDER_SITE_OTHER): Payer: Medicare Other | Admitting: Family Medicine

## 2014-11-04 ENCOUNTER — Encounter: Payer: Self-pay | Admitting: Family Medicine

## 2014-11-04 ENCOUNTER — Other Ambulatory Visit: Payer: Self-pay | Admitting: Family Medicine

## 2014-11-04 VITALS — BP 120/78 | HR 75 | Temp 98.2°F | Ht 64.0 in | Wt 258.1 lb

## 2014-11-04 DIAGNOSIS — I4891 Unspecified atrial fibrillation: Secondary | ICD-10-CM

## 2014-11-04 DIAGNOSIS — I10 Essential (primary) hypertension: Secondary | ICD-10-CM

## 2014-11-04 DIAGNOSIS — D649 Anemia, unspecified: Secondary | ICD-10-CM

## 2014-11-04 DIAGNOSIS — E11319 Type 2 diabetes mellitus with unspecified diabetic retinopathy without macular edema: Secondary | ICD-10-CM

## 2014-11-04 DIAGNOSIS — I5041 Acute combined systolic (congestive) and diastolic (congestive) heart failure: Secondary | ICD-10-CM

## 2014-11-04 DIAGNOSIS — N058 Unspecified nephritic syndrome with other morphologic changes: Secondary | ICD-10-CM

## 2014-11-04 DIAGNOSIS — E1129 Type 2 diabetes mellitus with other diabetic kidney complication: Secondary | ICD-10-CM | POA: Diagnosis not present

## 2014-11-04 DIAGNOSIS — N289 Disorder of kidney and ureter, unspecified: Secondary | ICD-10-CM

## 2014-11-04 DIAGNOSIS — E038 Other specified hypothyroidism: Secondary | ICD-10-CM

## 2014-11-04 DIAGNOSIS — I5042 Chronic combined systolic (congestive) and diastolic (congestive) heart failure: Secondary | ICD-10-CM | POA: Diagnosis not present

## 2014-11-04 DIAGNOSIS — J9621 Acute and chronic respiratory failure with hypoxia: Secondary | ICD-10-CM

## 2014-11-04 LAB — CBC
HEMATOCRIT: 32.1 % — AB (ref 36.0–46.0)
HEMOGLOBIN: 10.5 g/dL — AB (ref 12.0–15.0)
MCHC: 32.7 g/dL (ref 30.0–36.0)
MCV: 87.9 fl (ref 78.0–100.0)
PLATELETS: 235 10*3/uL (ref 150.0–400.0)
RBC: 3.65 Mil/uL — AB (ref 3.87–5.11)
RDW: 14.8 % (ref 11.5–15.5)
WBC: 8.6 10*3/uL (ref 4.0–10.5)

## 2014-11-04 LAB — COMPREHENSIVE METABOLIC PANEL
ALT: 17 U/L (ref 0–35)
AST: 20 U/L (ref 0–37)
Albumin: 3.9 g/dL (ref 3.5–5.2)
Alkaline Phosphatase: 44 U/L (ref 39–117)
BILIRUBIN TOTAL: 0.4 mg/dL (ref 0.2–1.2)
BUN: 99 mg/dL (ref 6–23)
CO2: 33 meq/L — AB (ref 19–32)
CREATININE: 3.61 mg/dL — AB (ref 0.40–1.20)
Calcium: 9.6 mg/dL (ref 8.4–10.5)
Chloride: 94 mEq/L — ABNORMAL LOW (ref 96–112)
GFR: 13.16 mL/min — AB (ref >60.00)
Glucose, Bld: 133 mg/dL — ABNORMAL HIGH (ref 70–99)
Potassium: 5.3 mEq/L — ABNORMAL HIGH (ref 3.5–5.1)
Sodium: 134 mEq/L — ABNORMAL LOW (ref 135–145)
Total Protein: 7.1 g/dL (ref 6.0–8.3)

## 2014-11-04 NOTE — Assessment & Plan Note (Signed)
Well controlled, no changes to meds. Encouraged heart healthy diet such as the DASH diet and exercise as tolerated.  °

## 2014-11-04 NOTE — Patient Instructions (Signed)

## 2014-11-04 NOTE — Progress Notes (Signed)
Destiny [name]  [id number] December 17, 1940 11/04/2014      Progress Note-Follow Up  Subjective  Chief Complaint  Chief Complaint  Patient presents with  . Hospitalization Follow-up    HPI  Patient is a 74 y.o. female in today for routine medical care. Patient is in today for hospital follow up accompanied by her husband. They report she has done well and feels good since returning home from the hospital. She has been trying to following her dietary restrictions and has stayed as active as her baseline allows. No palpitasions, flare in SOB, chest pain. No recent illness. No acute concerns at this visit. She denies any GI or GU complaints. Has tolerated the Amiodarone started for Afib.   Past Medical History  Diagnosis Date  . LBBB (left bundle branch block)   . Carotid stenosis     a. 10/19/2011 carotid duplex - Mild hard plaque bilaterally. Stable 40-59% bilateral ICA stenosis. Carotid US (04/2013):  Bilateral 40-59% ICA.  F/u 1 year  . Dyslipidemia   . Hypertension   . Chronic combined systolic and diastolic CHF (congestive heart failure)     a. EF as low as 25% in 2006;  b. EF 60-65% in 12/2011;  c. 02/2013 Echo: EF 35-40, mod mid-dist antsept HK, Gr 2 DD, mild LVH.  . Back pain, chronic     "just when I walk; mass on 3rd and 4th vertebrae right lower back"  . Cardiomyopathy, ischemic     a. 2012 s/p SJM 3231-40 Uni BiV ICD, ser # [id number].  Marland Kitchen CAD (coronary artery disease)     a. 05/2001 CABG x4: LIMA->LAD, VG->D1, VG->D2, VG->RCA.  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 CKD (chronic kidney disease), stage III   . Biventricular implantable cardiac defibrillator in situ 2007, 2012    a. 2007;  b. 2012 Gen change: SJM 3231-40 Uni BiV ICD, ser # [id number].  Marland Kitchen Hypoxemia requiring supplemental oxygen   . Candidiasis of vulva and vagina   . Hypothyroidism   . 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 Benay Spice, in remission  .  History of pneumonia 12/27/11    "3 times; it's been a long time ago"  . OSA on CPAP   . Gout ~ 08/2011  . PAF (paroxysmal atrial fibrillation)     on Coumadin  . DM (diabetes mellitus), type 2 with complications     insulin dependent, retinopathy, neuropathy  . Mural thrombus of left ventricle     before 2003, while on Coumadin, No h/o CVA  . Morbid obesity with BMI of 45.0-49.9, adult     Ht. 5'4". BMI 47.2  . Vitamin D deficiency 06/09/2012    historical  . Olecranon bursitis of right elbow 12/29/2012  . History of recurrent UTIs   . Diabetes 05/24/2013  . Hypercalcemia 09/26/2013  . Anemia, unspecified 12/13/2013  . Renal insufficiency 03/14/2014    Following with [facility], Dr Loyal Buba  . Medicare annual wellness visit, subsequent 09/12/2014    Medication: Reviewed with patient and updated  Preferred Pharmacy and which med where: 90 day supply/mail order: [facility] [place]  DM supplies: [facility]   Allergies verified: UTD   Immunization Status: Flu vaccine-- 01/12/14 Tdap-- 01/28/12 PNA-- 03/11/13 (23)  Shingles--08/19/07  MMG-- 06/25/08 with Johnnette Gourd, [facility] at [facility]    Past Surgical History  Procedure Laterality Date  .  Cardiac catheterization  06/03/01  . Cholecystectomy  1970  . Tubal ligation  1972  . Abdominal hysterectomy  1982  . Insert / replace / remove pacemaker  2007; 2012    w/AICD  . Coronary artery bypass graft  2003    CABG X4  . Cataract extraction      left eye  . Cardioversion N/A 07/30/2014    Procedure: CARDIOVERSION;  Surgeon: Jolaine Artist, MD;  Location: [facility];  Service: Cardiovascular;  Laterality: N/A;  . Refractive surgery      right eye    Family History  Problem Relation Age of Onset  . Kidney cancer Mother     kidney and female repo - died @ 43  . Asthma Mother   . Cancer Mother     gyn and renal  . Heart disease Mother     CAD  . Heart disease Father      died @ 60  . Stroke Father   . Diabetes Father   . Hypertension Father   . Hyperlipidemia Father   . Hypertension Son   . Hypertension Maternal Aunt   . Kidney cancer Maternal Uncle   . Cancer Maternal Uncle   . Cirrhosis Maternal Grandmother     non alcohol  . Cancer Maternal Grandfather   . Kidney cancer Maternal Grandfather     History   Social History  . Marital Status: Married    Spouse Name: N/A  . Number of Children: Y  . Years of Education: N/A   Occupational History  . 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    Social History Main Topics  . Smoking status: Never Smoker   . Smokeless tobacco: Never Used  . Alcohol Use: No  . Drug Use: No  . Sexual Activity: Not Currently     Comment: lives with husband, no major dietary restrictions   Other Topics Concern  . Not on file   Social History Narrative   Lives in [place] with her husband.  She does not routinely exercise or adhere to any particular diet.      Current Outpatient Prescriptions on File Prior to Visit  Medication Sig Dispense Refill  . acetaminophen (TYLENOL) 500 MG tablet Take 1,000 mg by mouth every 6 (six) hours as needed (pain).    Marland Kitchen albuterol (PROVENTIL HFA;VENTOLIN HFA) 108 (90 BASE) MCG/ACT inhaler Inhale 2 puffs into the lungs every 6 (six) hours as needed for wheezing or shortness of breath. 1 Inhaler 1  . amiodarone (PACERONE) 200 MG tablet Take 1 tablet (200 mg total) by mouth 2 (two) times daily. 180 tablet 1  . atorvastatin (LIPITOR) 20 MG tablet Take 1 tablet (20 mg total) by mouth daily. 90 tablet 1  . carvedilol (COREG) 25 MG tablet Take 1 tablet by mouth 2 (two) times daily.    Marland Kitchen escitalopram (LEXAPRO) 10 MG tablet Take 1 tablet (10 mg total) by mouth daily. 90 tablet 1  . fenofibrate (TRICOR) 48 MG tablet Take 1 tablet (48 mg total) by mouth daily. 90 tablet 3  . fluticasone (FLONASE) 50 MCG/ACT nasal spray Place 2 sprays into both nostrils daily as needed for allergies or  rhinitis. As needed for nasal stuffiness. 16 g 0  . gabapentin (NEURONTIN) 300 MG capsule Take 1 capsule (300 mg total) by mouth 2 (two) times daily. 180 capsule 1  . glucose blood (FREESTYLE LITE) test strip DX: 250.60  Check sugars bid and as needed  100 each 2  . insulin NPH (HUMULIN N,NOVOLIN N) 100 UNIT/ML injection Inject 20-45 Units into the skin 2 (two) times daily before a meal. Take 45 units in the morning and 15 units in the evening    . insulin regular (NOVOLIN R,HUMULIN R) 100 units/mL injection Change to 15 units three times a with meals (to cover all meals--this is the same total amount but spread out) (Patient taking differently: Inject 15 Units into the skin 2 (two) times daily before a meal. Change to 15 units three times a with meals (to cover all meals--this is the same total amount but spread out)) 10 mL 12  . levothyroxine (SYNTHROID, LEVOTHROID) 25 MCG tablet TAKE 1 TABLET EVERY DAY BEFORE BREAKFAST 90 tablet 1  . LORazepam (ATIVAN) 0.5 MG tablet Take 1 tablet (0.5 mg total) by mouth at bedtime as needed for anxiety. 90 tablet 1  . NON FORMULARY Oxygen---24 hour    . torsemide (DEMADEX) 20 MG tablet Take 40mg  in morning and 20mg  in evening. May take an extra tab for 3# weight gain, swelling or SOB 135 tablet 3  . Vitamin D, Ergocalciferol, (DRISDOL) 50000 UNITS CAPS capsule Take 50,000 Units by mouth every 7 (seven) days. Tuesdays    . warfarin (COUMADIN) 5 MG tablet Take 1 tablet (5 mg total) by mouth daily at 6 PM.    . warfarin (COUMADIN) 5 MG tablet TAKE AS DIRECTED BY MOUTH BY ANTICOAGULATION  CLINIC 45 tablet 3   No current facility-administered medications on file prior to visit.    Allergies  Allergen Reactions  . Sulfonamide Derivatives Other (See Comments)    Unknown; "childhood allergy/mother"    Review of Systems  Review of Systems  Constitutional: Positive for malaise/fatigue. Negative for fever.  HENT: Negative for congestion.   Eyes: Negative for  discharge.  Respiratory: Positive for shortness of breath.   Cardiovascular: Negative for chest pain, palpitations and leg swelling.  Gastrointestinal: Positive for diarrhea. Negative for nausea and abdominal pain.  Genitourinary: Negative for dysuria.  Musculoskeletal: Negative for falls.  Skin: Negative for rash.  Neurological: Negative for loss of consciousness and headaches.  Endo/Heme/Allergies: Negative for polydipsia.  Psychiatric/Behavioral: Negative for depression and suicidal ideas. The patient is not nervous/anxious and does not have insomnia.     Objective  BP 120/78 mmHg  Pulse 75  Temp(Src) 98.2 F (36.8 C) (Oral)  Ht 5\' 4"  (1.626 m)  Wt 258 lb 2 oz (117.085 kg)  BMI 44.29 kg/m2  SpO2 96%  Physical Exam  Physical Exam  Constitutional: She is oriented to person, place, and time and well-developed, well-nourished, and in no distress. No distress.  HENT:  Head: Normocephalic and atraumatic.  Eyes: Conjunctivae are normal.  Neck: Neck supple. No thyromegaly present.  Cardiovascular: Normal rate and normal heart sounds.   No murmur heard. Irregularly irregular  Pulmonary/Chest: Effort normal and breath sounds normal. She has no wheezes.  Abdominal: She exhibits no distension and no mass.  Musculoskeletal: She exhibits no edema.  Lymphadenopathy:    She has no cervical adenopathy.  Neurological: She is alert and oriented to person, place, and time.  Skin: Skin is warm and dry. No rash noted. She is not diaphoretic.  Psychiatric: Memory, affect and judgment normal.    Lab Results  Component Value Date   TSH 1.854 09/26/2014   Lab Results  Component Value Date   WBC 9.8 10/26/2014   HGB 9.3* 10/26/2014   HCT 29.0* 10/26/2014   MCV 90.3  10/26/2014   PLT 192 10/26/2014   Lab Results  Component Value Date   CREATININE 2.28* 10/29/2014   BUN 74* 10/29/2014   NA 140 10/29/2014   K 4.1 10/29/2014   CL 101 10/29/2014   CO2 29 10/29/2014   Lab Results    Component Value Date   ALT 12* 10/25/2014   AST 14* 10/25/2014   ALKPHOS 36* 10/25/2014   BILITOT 0.6 10/25/2014   Lab Results  Component Value Date   CHOL 159 05/11/2014   Lab Results  Component Value Date   HDL 31.20* 05/11/2014   Lab Results  Component Value Date   LDLCALC 41 07/27/2013   Lab Results  Component Value Date   TRIG 337.0* 05/11/2014   Lab Results  Component Value Date   CHOLHDL 5 05/11/2014     Assessment & Plan  Essential hypertension Well controlled, no changes to meds. Encouraged heart healthy diet such as the DASH diet and exercise as tolerated.   Type 2 diabetes mellitus, controlled, with renal complications Worsening renal function since hospitalization for cardiac complications. Held amiodarone one day and liberalized fluids slightly and numbers improved a marginal amount. Have contacted her nephrology practice, [facility] and they have agreed to see patient in follow up to monitor. Patient looks and feels well since being home from the hospital  Morbid obesity Encouraged DASH diet, decrease po intake and increase exercise as tolerated. Needs 7-8 hours of sleep nightly. Avoid trans fats, eat small, frequent meals every 4-5 hours with lean proteins, complex carbs and healthy fats. Minimize simple carbs  Hypothyroidism On Levothyroxine, continue to monitor. TSH WNL in May 2016  Chronic combined systolic and diastolic heart failure Recently hospitalized with acute on chronic CHF now feeling better and Euvolemic, has close follow up with cardiology. No change in diuretics at this time.  ATRIAL FIBRILLATION, PAROXYSMAL Well controlled at today's visit, symptoms improved since being stabilized during recent hospitalization. Held Amiodarone one day to reassess renal functions but is asked to continue the medicine for now and follow up with cardiology  Diabetes mellitus type 2 with retinopathy Last hgba1c 7.2 no change in meds at this time.  hgba1c acceptable, minimize simple carbs. Increase activity as tolerated

## 2014-11-04 NOTE — Progress Notes (Signed)
Pre visit review using our clinic review tool, if applicable. No additional management support is needed unless otherwise documented below in the visit note. 

## 2014-11-05 ENCOUNTER — Other Ambulatory Visit (INDEPENDENT_AMBULATORY_CARE_PROVIDER_SITE_OTHER): Payer: Medicare Other

## 2014-11-05 ENCOUNTER — Telehealth: Payer: Self-pay | Admitting: Family Medicine

## 2014-11-05 ENCOUNTER — Telehealth: Payer: Self-pay | Admitting: *Deleted

## 2014-11-05 DIAGNOSIS — N289 Disorder of kidney and ureter, unspecified: Secondary | ICD-10-CM

## 2014-11-05 LAB — COMPREHENSIVE METABOLIC PANEL
ALBUMIN: 3.9 g/dL (ref 3.5–5.2)
ALK PHOS: 43 U/L (ref 39–117)
ALT: 18 U/L (ref 0–35)
AST: 22 U/L (ref 0–37)
BILIRUBIN TOTAL: 0.4 mg/dL (ref 0.2–1.2)
BUN: 98 mg/dL — AB (ref 6–23)
CO2: 36 meq/L — AB (ref 19–32)
Calcium: 9.8 mg/dL (ref 8.4–10.5)
Chloride: 94 mEq/L — ABNORMAL LOW (ref 96–112)
Creatinine, Ser: 3.49 mg/dL — ABNORMAL HIGH (ref 0.40–1.20)
GFR: 13.69 mL/min — AB (ref >60.00)
Glucose, Bld: 79 mg/dL (ref 70–99)
POTASSIUM: 4.8 meq/L (ref 3.5–5.1)
SODIUM: 136 meq/L (ref 135–145)
Total Protein: 7.1 g/dL (ref 6.0–8.3)

## 2014-11-05 NOTE — Telephone Encounter (Signed)
Home health certification and plan of care received via fax from [facility]. Forwarded to Dr. Charlett Blake. JG//CMA

## 2014-11-05 NOTE — Telephone Encounter (Signed)
I called [facility] and spoke to the Dr. Elissa Hefty RN [facility].  Informed of patients recent results.  She had me fax over all this weeks lab resutls as well as PCP's Office notes from the visit to [phone].  She would give to Dr. Marval Regal as soon as received information and address for this patient.

## 2014-11-05 NOTE — Telephone Encounter (Signed)
Please call Destiny [name] and see whom we can fax her labs to for review. She looked well and felt well yesterday when we saw her for hospital follow up. She was hospitalized with Afib and CHF. We held her Amiodaraone for one day and liberalized her fluids slightly and her numbers look slightly better today compared to yesterday but not greatly better. They last saw her in March

## 2014-11-05 NOTE — Telephone Encounter (Signed)
CRITICAL LAB RESULT  BUN 98 GFR 13.68 Creatinine 3.49

## 2014-11-06 NOTE — Telephone Encounter (Signed)
Left message on home phone to confirm patient is aware nephrology has been contacted and will be getting in touch with them to monitor her worsening kidney function. She has been instructed to maintain the slight increase in fluid intake and to take her Amiodarone as prescribed seek care if any concerns or significant SOB, weight gain is noted.

## 2014-11-07 NOTE — Assessment & Plan Note (Addendum)
Recently hospitalized with acute on chronic CHF now feeling better and Euvolemic, has close follow up with cardiology. No change in diuretics at this time.

## 2014-11-07 NOTE — Assessment & Plan Note (Signed)
Encouraged DASH diet, decrease po intake and increase exercise as tolerated. Needs 7-8 hours of sleep nightly. Avoid trans fats, eat small, frequent meals every 4-5 hours with lean proteins, complex carbs and healthy fats. Minimize simple carbs 

## 2014-11-07 NOTE — Assessment & Plan Note (Signed)
Last hgba1c 7.2 no change in meds at this time. hgba1c acceptable, minimize simple carbs. Increase activity as tolerated

## 2014-11-07 NOTE — Assessment & Plan Note (Signed)
Well controlled at today's visit, symptoms improved since being stabilized during recent hospitalization. Held Amiodarone one day to reassess renal functions but is asked to continue the medicine for now and follow up with cardiology

## 2014-11-07 NOTE — Assessment & Plan Note (Signed)
On Levothyroxine, continue to monitor. TSH WNL in May 2016

## 2014-11-07 NOTE — Assessment & Plan Note (Signed)
Worsening renal function since hospitalization for cardiac complications. Held amiodarone one day and liberalized fluids slightly and numbers improved a marginal amount. Have contacted her nephrology practice, [facility] and they have agreed to see patient in follow up to monitor. Patient looks and feels well since being home from the hospital

## 2014-11-09 ENCOUNTER — Telehealth: Payer: Self-pay | Admitting: Family Medicine

## 2014-11-09 ENCOUNTER — Ambulatory Visit (INDEPENDENT_AMBULATORY_CARE_PROVIDER_SITE_OTHER): Payer: Medicare Other | Admitting: Pharmacist

## 2014-11-09 ENCOUNTER — Telehealth: Payer: Self-pay | Admitting: Internal Medicine

## 2014-11-09 DIAGNOSIS — E1129 Type 2 diabetes mellitus with other diabetic kidney complication: Secondary | ICD-10-CM | POA: Diagnosis not present

## 2014-11-09 DIAGNOSIS — Z5181 Encounter for therapeutic drug level monitoring: Secondary | ICD-10-CM

## 2014-11-09 DIAGNOSIS — I5043 Acute on chronic combined systolic (congestive) and diastolic (congestive) heart failure: Secondary | ICD-10-CM | POA: Diagnosis not present

## 2014-11-09 DIAGNOSIS — C8590 Non-Hodgkin lymphoma, unspecified, unspecified site: Secondary | ICD-10-CM | POA: Diagnosis not present

## 2014-11-09 DIAGNOSIS — N183 Chronic kidney disease, stage 3 (moderate): Secondary | ICD-10-CM | POA: Diagnosis not present

## 2014-11-09 DIAGNOSIS — I4891 Unspecified atrial fibrillation: Secondary | ICD-10-CM

## 2014-11-09 DIAGNOSIS — I129 Hypertensive chronic kidney disease with stage 1 through stage 4 chronic kidney disease, or unspecified chronic kidney disease: Secondary | ICD-10-CM | POA: Diagnosis not present

## 2014-11-09 LAB — POCT INR: INR: 2.4

## 2014-11-09 NOTE — Telephone Encounter (Signed)
She needs to call Dr. Lovena Le (Cardiologist) regarding her Amiodarone. 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  She needs to have appointment scheduled with Nephrology ASAP.  Please call to see if she has been scheduled for appointment.

## 2014-11-09 NOTE — Telephone Encounter (Signed)
Called the patient informed to Call Dr. Tanna Furry office today to address amiodarone as Raiford Noble PA-C instructed.  The patient did agree to do so at once.

## 2014-11-09 NOTE — Telephone Encounter (Signed)
I called the patient and [facility] called her this morning.  She has an appt. On July 5th to get labs done. They will contact her with the results/instructions.  She has appt. With Dr. Lovena Le July 5th and Dr. Aundra Dubin on July 8th.  Advise.

## 2014-11-09 NOTE — Telephone Encounter (Signed)
I  Did call [facility] and spoke to [facility] Dr. Elissa Hefty nurse.   He has reviewed all her labs/office notes and has stopped the enalapril and scheduled labs for her on July 5th.  They by the way did not have amiodarone on her medication list.  I will call the patient if you wish to call Dr. Tanna Furry office.  Advise

## 2014-11-09 NOTE — Telephone Encounter (Signed)
Dr Lovena Le reviewed and wants her to continue Amiodarone and follow up with Dr Louanna Raw for renal dysfunction.  Patient aware

## 2014-11-09 NOTE — Telephone Encounter (Signed)
Relation to pt: self  Call back number: [phone]    Reason for call:  As per MD informed pt to hold amiodarone (PACERONE) 200 MG tablet due to labs taken Friday 12/05/14. Pt not sure if she should start taking meds again. Please advise

## 2014-11-09 NOTE — Telephone Encounter (Signed)
Again patient needs to contact Dr. Lovena Le regarding Amiodarone.   Thank you for getting everything set up with [facility].

## 2014-11-09 NOTE — Telephone Encounter (Signed)
New Message      Pt calling stating that Dr. Randel Pigg wanted pt to find out from Dr. Lovena Le if pt should keep taking Amiodarone 200 mg 2x per day. Dr. Randel Pigg is concerned about pt's recent lab work, pt's Kreatin and Potassium were high. Results in Epic. Please call back and advise.

## 2014-11-09 NOTE — Telephone Encounter (Signed)
Can they not work her in sooner? Her renal function is atrocious. Again she needs to speak with Dr. Lovena Le regarding amiodarone.

## 2014-11-12 ENCOUNTER — Telehealth: Payer: Self-pay | Admitting: Internal Medicine

## 2014-11-12 DIAGNOSIS — I5043 Acute on chronic combined systolic (congestive) and diastolic (congestive) heart failure: Secondary | ICD-10-CM | POA: Diagnosis not present

## 2014-11-12 DIAGNOSIS — E1129 Type 2 diabetes mellitus with other diabetic kidney complication: Secondary | ICD-10-CM | POA: Diagnosis not present

## 2014-11-12 DIAGNOSIS — C8590 Non-Hodgkin lymphoma, unspecified, unspecified site: Secondary | ICD-10-CM | POA: Diagnosis not present

## 2014-11-12 DIAGNOSIS — I129 Hypertensive chronic kidney disease with stage 1 through stage 4 chronic kidney disease, or unspecified chronic kidney disease: Secondary | ICD-10-CM | POA: Diagnosis not present

## 2014-11-12 DIAGNOSIS — I4891 Unspecified atrial fibrillation: Secondary | ICD-10-CM | POA: Diagnosis not present

## 2014-11-12 DIAGNOSIS — N183 Chronic kidney disease, stage 3 (moderate): Secondary | ICD-10-CM | POA: Diagnosis not present

## 2014-11-12 NOTE — Telephone Encounter (Signed)
Pt c/o medication issue:  1. Name of Medication: Amlodipine  2. How are you currently taking this medication (dosage and times per day)? Not taking it  3. Are you having a reaction (difficulty breathing--STAT)? No  4. What is your medication issue? When pt left hospital on 10-29-14 Amlodipine was not on her med list, didn't notice it until the next week, so she did take it the first week out of the hospital, but has not taken any since, she wants to verify that so is NOT to take it

## 2014-11-12 NOTE — Telephone Encounter (Signed)
Patient informed that Dr. Lovena Le is not in the office today however her question regarding Amlodipine will be forwarded to Dr. Lovena Le for his review/advisement. Patient has an appointment with him this coming Tuesday, if she would like to further discuss it. At this time she is unsure whether she should be taking it. She states she does not remember if it was discontinued or she was supposed to restart it after she was discharged from the hospital. Informed patient that her Amlodipine was discontinued on 5/19 upon discharge from the hospital. I do not see any indication that it was ever restarted, so she should not be taking it at this time. Routed to Dr. Lovena Le as Juluis Rainier. Patient verbalized understanding and agreement.

## 2014-11-12 NOTE — Telephone Encounter (Signed)
Faxed successfully. Sent for scanning. JG//CMA 

## 2014-11-16 ENCOUNTER — Encounter: Payer: Self-pay | Admitting: Internal Medicine

## 2014-11-16 ENCOUNTER — Ambulatory Visit (INDEPENDENT_AMBULATORY_CARE_PROVIDER_SITE_OTHER): Payer: Medicare Other | Admitting: Internal Medicine

## 2014-11-16 ENCOUNTER — Encounter: Payer: Self-pay | Admitting: *Deleted

## 2014-11-16 VITALS — BP 118/78 | HR 60 | Ht 64.0 in | Wt 258.4 lb

## 2014-11-16 DIAGNOSIS — Z95810 Presence of automatic (implantable) cardiac defibrillator: Secondary | ICD-10-CM | POA: Diagnosis not present

## 2014-11-16 DIAGNOSIS — I255 Ischemic cardiomyopathy: Secondary | ICD-10-CM

## 2014-11-16 DIAGNOSIS — I4891 Unspecified atrial fibrillation: Secondary | ICD-10-CM | POA: Diagnosis not present

## 2014-11-16 DIAGNOSIS — N181 Chronic kidney disease, stage 1: Secondary | ICD-10-CM | POA: Diagnosis not present

## 2014-11-16 DIAGNOSIS — I5042 Chronic combined systolic (congestive) and diastolic (congestive) heart failure: Secondary | ICD-10-CM | POA: Diagnosis not present

## 2014-11-16 DIAGNOSIS — I639 Cerebral infarction, unspecified: Secondary | ICD-10-CM

## 2014-11-16 DIAGNOSIS — C8590 Non-Hodgkin lymphoma, unspecified, unspecified site: Secondary | ICD-10-CM | POA: Diagnosis not present

## 2014-11-16 DIAGNOSIS — I5043 Acute on chronic combined systolic (congestive) and diastolic (congestive) heart failure: Secondary | ICD-10-CM | POA: Diagnosis not present

## 2014-11-16 DIAGNOSIS — I129 Hypertensive chronic kidney disease with stage 1 through stage 4 chronic kidney disease, or unspecified chronic kidney disease: Secondary | ICD-10-CM | POA: Diagnosis not present

## 2014-11-16 DIAGNOSIS — Z5181 Encounter for therapeutic drug level monitoring: Secondary | ICD-10-CM

## 2014-11-16 DIAGNOSIS — I10 Essential (primary) hypertension: Secondary | ICD-10-CM

## 2014-11-16 DIAGNOSIS — N183 Chronic kidney disease, stage 3 (moderate): Secondary | ICD-10-CM | POA: Diagnosis not present

## 2014-11-16 DIAGNOSIS — E1129 Type 2 diabetes mellitus with other diabetic kidney complication: Secondary | ICD-10-CM | POA: Diagnosis not present

## 2014-11-16 LAB — CUP PACEART INCLINIC DEVICE CHECK
Brady Statistic RA Percent Paced: 0.11 %
Brady Statistic RV Percent Paced: 69 %
Date Time Interrogation Session: 20160705110805
HighPow Impedance: 49.5152
Lead Channel Impedance Value: 275 Ohm
Lead Channel Impedance Value: 387.5 Ohm
Lead Channel Pacing Threshold Amplitude: 0.75 V
Lead Channel Pacing Threshold Pulse Width: 0.5 ms
Lead Channel Sensing Intrinsic Amplitude: 11.7 mV
Lead Channel Setting Pacing Amplitude: 1.75 V
Lead Channel Setting Pacing Amplitude: 2.25 V
Lead Channel Setting Pacing Amplitude: 2.5 V
Lead Channel Setting Pacing Pulse Width: 0.6 ms
MDC IDC MSMT BATTERY REMAINING LONGEVITY: 31.2 mo
MDC IDC MSMT LEADCHNL LV PACING THRESHOLD AMPLITUDE: 1.25 V
MDC IDC MSMT LEADCHNL LV PACING THRESHOLD PULSEWIDTH: 0.6 ms
MDC IDC MSMT LEADCHNL RA IMPEDANCE VALUE: 312.5 Ohm
MDC IDC MSMT LEADCHNL RA SENSING INTR AMPL: 0.8 mV
MDC IDC PG SERIAL: 631685
MDC IDC SET LEADCHNL RV PACING PULSEWIDTH: 0.5 ms
MDC IDC SET LEADCHNL RV SENSING SENSITIVITY: 0.5 mV
MDC IDC SET ZONE DETECTION INTERVAL: 310 ms
MDC IDC SET ZONE DETECTION INTERVAL: 345 ms
Zone Setting Detection Interval: 250 ms

## 2014-11-16 LAB — POCT INR: INR: 2.7

## 2014-11-16 NOTE — Assessment & Plan Note (Signed)
She appears euvolemic. I have asked that she continue her current medications. Will follow.

## 2014-11-16 NOTE — Assessment & Plan Note (Signed)
She continues in atrial fib which is persistent. She has been on amiodarone for several weeks. She will continue this medication and will come back for DCCV in a few weeks. She will continue systemic anti-coagulation.

## 2014-11-16 NOTE — Assessment & Plan Note (Signed)
Her device is working normally. Will recheck in several months. 

## 2014-11-16 NOTE — Assessment & Plan Note (Signed)
Her blood pressure is well controlled. She has asked me about amlodipine which has been stopped. Will not plan to restart at this point.

## 2014-11-16 NOTE — Progress Notes (Signed)
HPI Destiny [name] returns today for followup. She is a very pleasant 74 year old woman with an ischemic cardiomyopathy, chronic systolic heart failure, left bundle branch block, status post biventricular ICD implantation.  She is chronically oxygen dependent and desaturates at rest without oxygen. She does have morbid obesity and hypertension. She notes mild peripheral edema. No syncope. When I saw her last, she was not doing well. She has undergone PFT's and was rehospitalized and treated for pneumonia and copd. She feels better. She has been on amiodarone. She has been therapeutically anti-coagulated.    Allergies  Allergen Reactions  . Sulfonamide Derivatives Other (See Comments)    Unknown; "childhood allergy/mother"     Current Outpatient Prescriptions  Medication Sig Dispense Refill  . acetaminophen (TYLENOL) 500 MG tablet Take 1,000 mg by mouth every 6 (six) hours as needed (pain).    Marland Kitchen albuterol (PROVENTIL HFA;VENTOLIN HFA) 108 (90 BASE) MCG/ACT inhaler Inhale 2 puffs into the lungs every 6 (six) hours as needed for wheezing or shortness of breath. 1 Inhaler 1  . amiodarone (PACERONE) 200 MG tablet Take 1 tablet (200 mg total) by mouth 2 (two) times daily. 180 tablet 1  . atorvastatin (LIPITOR) 20 MG tablet Take 1 tablet (20 mg total) by mouth daily. 90 tablet 1  . carvedilol (COREG) 25 MG tablet Take 2 tablets by mouth 2 (two) times daily.     Marland Kitchen escitalopram (LEXAPRO) 10 MG tablet Take 1 tablet (10 mg total) by mouth daily. 90 tablet 1  . fenofibrate (TRICOR) 48 MG tablet Take 1 tablet (48 mg total) by mouth daily. 90 tablet 3  . fluticasone (FLONASE) 50 MCG/ACT nasal spray Place 2 sprays into both nostrils daily as needed for allergies or rhinitis. As needed for nasal stuffiness. 16 g 0  . gabapentin (NEURONTIN) 300 MG capsule Take 1 capsule (300 mg total) by mouth 2 (two) times daily. 180 capsule 1  . glucose blood (FREESTYLE LITE) test strip DX: 250.60  Check sugars bid and as  needed 100 each 2  . insulin NPH (HUMULIN N,NOVOLIN N) 100 UNIT/ML injection Inject 20-45 Units into the skin 2 (two) times daily before a meal. Take 45 units in the morning and 15 units in the evening    . insulin regular (NOVOLIN R,HUMULIN R) 100 units/mL injection Change to 15 units three times a with meals (to cover all meals--this is the same total amount but spread out) (Patient taking differently: Inject 15 Units into the skin 2 (two) times daily before a meal. Change to 15 units three times a with meals (to cover all meals--this is the same total amount but spread out)) 10 mL 12  . levothyroxine (SYNTHROID, LEVOTHROID) 25 MCG tablet Take 25 mcg by mouth daily before breakfast.    . LORazepam (ATIVAN) 0.5 MG tablet Take 1 tablet (0.5 mg total) by mouth at bedtime as needed for anxiety. 90 tablet 1  . NON FORMULARY Oxygen---24 hour    . torsemide (DEMADEX) 20 MG tablet Take 40mg  in morning and 20mg  in evening. May take an extra tab for 3# weight gain, swelling or SOB 135 tablet 3  . Vitamin D, Ergocalciferol, (DRISDOL) 50000 UNITS CAPS capsule Take 50,000 Units by mouth every 7 (seven) days. Tuesdays    . warfarin (COUMADIN) 5 MG tablet Take 1 tablet (5 mg total) by mouth daily at 6 PM.     No current facility-administered medications for this visit.     Past Medical History  Diagnosis Date  .  LBBB (left bundle branch block)   . Carotid stenosis     a. 10/19/2011 carotid duplex - Mild hard plaque bilaterally. Stable 40-59% bilateral ICA stenosis. Carotid US (04/2013):  Bilateral 40-59% ICA.  F/u 1 year  . Dyslipidemia   . Hypertension   . Chronic combined systolic and diastolic CHF (congestive heart failure)     a. EF as low as 25% in 2006;  b. EF 60-65% in 12/2011;  c. 02/2013 Echo: EF 35-40, mod mid-dist antsept HK, Gr 2 DD, mild LVH.  . Back pain, chronic     "just when I walk; mass on 3rd and 4th vertebrae right lower back"  . Cardiomyopathy, ischemic     a. 2012 s/p SJM 3231-40 Uni  BiV ICD, ser # [id number].  Marland Kitchen CAD (coronary artery disease)     a. 05/2001 CABG x4: LIMA->LAD, VG->D1, VG->D2, VG->RCA.  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 CKD (chronic kidney disease), stage III   . Biventricular implantable cardiac defibrillator in situ 2007, 2012    a. 2007;  b. 2012 Gen change: SJM 3231-40 Uni BiV ICD, ser # [id number].  Marland Kitchen Hypoxemia requiring supplemental oxygen   . Candidiasis of vulva and vagina   . Hypothyroidism   . 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 Benay Spice, in remission  . History of pneumonia 12/27/11    "3 times; it's been a long time ago"  . OSA on CPAP   . Gout ~ 08/2011  . PAF (paroxysmal atrial fibrillation)     on Coumadin  . DM (diabetes mellitus), type 2 with complications     insulin dependent, retinopathy, neuropathy  . Mural thrombus of left ventricle     before 2003, while on Coumadin, No h/o CVA  . Morbid obesity with BMI of 45.0-49.9, adult     Ht. 5'4". BMI 47.2  . Vitamin D deficiency 06/09/2012    historical  . Olecranon bursitis of right elbow 12/29/2012  . History of recurrent UTIs   . Diabetes 05/24/2013  . Hypercalcemia 09/26/2013  . Anemia, unspecified 12/13/2013  . Renal insufficiency 03/14/2014    Following with [facility], Dr Loyal Buba  . Medicare annual wellness visit, subsequent 09/12/2014    Medication: Reviewed with patient and updated  Preferred Pharmacy and which med where: 90 day supply/mail order: [place] [facility]: [place]  DM supplies: [facility]   Allergies verified: UTD   Immunization Status: Flu vaccine-- 01/12/14 Tdap-- 01/28/12 PNA-- 03/11/13 (23)  Shingles--08/19/07  MMG-- 06/25/08 with Johnnette Gourd, MD at [facility]:   All systems reviewed and negative except as noted in the HPI.   Past Surgical History  Procedure Laterality Date  . Cardiac catheterization  06/03/01  .  Cholecystectomy  1970  . Tubal ligation  1972  . Abdominal hysterectomy  1982  . Insert / replace / remove pacemaker  2007; 2012    w/AICD  . Coronary artery bypass graft  2003    CABG X4  . Cataract extraction      left eye  . Cardioversion N/A 07/30/2014    Procedure: CARDIOVERSION;  Surgeon: Jolaine Artist, MD;  Location: [facility] ENDOSCOPY;  Service: Cardiovascular;  Laterality: N/A;  . Refractive surgery      right eye     Family History  Problem Relation Age of Onset  . Kidney cancer Mother  kidney and female repo - died @ 31  . Asthma Mother   . Cancer Mother     gyn and renal  . Heart disease Mother     CAD  . Heart disease Father     died @ 53  . Stroke Father   . Diabetes Father   . Hypertension Father   . Hyperlipidemia Father   . Hypertension Son   . Hypertension Maternal Aunt   . Kidney cancer Maternal Uncle   . Cancer Maternal Uncle   . Cirrhosis Maternal Grandmother     non alcohol  . Cancer Maternal Grandfather   . Kidney cancer Maternal Grandfather      History   Social History  . Marital Status: Married    Spouse Name: N/A  . Number of Children: Y  . Years of Education: N/A   Occupational History  . 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    Social History Main Topics  . Smoking status: Never Smoker   . Smokeless tobacco: Never Used  . Alcohol Use: No  . Drug Use: No  . Sexual Activity: Not Currently     Comment: lives with husband, no major dietary restrictions   Other Topics Concern  . Not on file   Social History Narrative   Lives in [place] with her husband.  She does not routinely exercise or adhere to any particular diet.       BP 118/78 mmHg  Pulse 60  Ht 5\' 4"  (1.626 m)  Wt 258 lb 6.4 oz (117.209 kg)  BMI 44.33 kg/m2  Physical Exam:  stable appearing morbidly obese, 74 year old woman, NAD HEENT: Unremarkable except for oxygen by nasal cannula Neck:  6 cm JVD, no thyromegally Back:  No CVA tenderness Lungs:   Clear except for minimal basilar rales. No wheezes, no rhonchi. Well-healed ICD incision. HEART:  Regular rate rhythm, no murmurs, no rubs, no clicks Abd:  soft, positive bowel sounds, obese, no organomegally, no rebound, no guarding Ext:  2 plus pulses, no edema, no cyanosis, no clubbing Skin:  No rashes no nodules Neuro:  CN II through XII intact, motor grossly intact  DEVICE  Normal device function.  See PaceArt for details. BiV pacing is 69%  Assess/Plan:

## 2014-11-16 NOTE — Patient Instructions (Signed)
Medication Instructions: - none  Labwork: - Your physician recommends that you return for lab work: Tuesday 11/23/14- BMP/ CBC/ INR  Procedures/Testing: - Your physician has recommended that you have a Cardioversion (DCCV). Electrical Cardioversion uses a jolt of electricity to your heart either through paddles or wired patches attached to your chest. This is a controlled, usually prescheduled, procedure. Defibrillation is done under light anesthesia in the hospital, and you usually go home the day of the procedure. This is done to get your heart back into a normal rhythm. You are not awake for the procedure. Please see the instruction sheet given to you today.  Follow-Up: - Your physician recommends that you schedule a follow-up appointment: the week of 12/06/14 with Chanetta Marshall, NP  Any Additional Special Instructions Will Be Listed Below (If Applicable).

## 2014-11-18 ENCOUNTER — Ambulatory Visit (HOSPITAL_COMMUNITY)
Admit: 2014-11-18 | Discharge: 2014-11-18 | Disposition: A | Discharge status: Home / Self Care | Payer: Medicare Other | Source: Ambulatory Visit | Attending: Cardiology | Admitting: Cardiology

## 2014-11-18 ENCOUNTER — Encounter (HOSPITAL_COMMUNITY): Payer: Self-pay

## 2014-11-18 VITALS — BP 104/60 | HR 72 | Wt 257.5 lb

## 2014-11-18 DIAGNOSIS — I6529 Occlusion and stenosis of unspecified carotid artery: Secondary | ICD-10-CM | POA: Insufficient documentation

## 2014-11-18 DIAGNOSIS — I2510 Atherosclerotic heart disease of native coronary artery without angina pectoris: Secondary | ICD-10-CM | POA: Insufficient documentation

## 2014-11-18 DIAGNOSIS — E785 Hyperlipidemia, unspecified: Secondary | ICD-10-CM | POA: Insufficient documentation

## 2014-11-18 DIAGNOSIS — E039 Hypothyroidism, unspecified: Secondary | ICD-10-CM | POA: Diagnosis not present

## 2014-11-18 DIAGNOSIS — E1122 Type 2 diabetes mellitus with diabetic chronic kidney disease: Secondary | ICD-10-CM | POA: Insufficient documentation

## 2014-11-18 DIAGNOSIS — I5022 Chronic systolic (congestive) heart failure: Secondary | ICD-10-CM | POA: Diagnosis not present

## 2014-11-18 DIAGNOSIS — E1140 Type 2 diabetes mellitus with diabetic neuropathy, unspecified: Secondary | ICD-10-CM | POA: Insufficient documentation

## 2014-11-18 DIAGNOSIS — N183 Chronic kidney disease, stage 3 unspecified: Secondary | ICD-10-CM

## 2014-11-18 DIAGNOSIS — E11319 Type 2 diabetes mellitus with unspecified diabetic retinopathy without macular edema: Secondary | ICD-10-CM | POA: Diagnosis not present

## 2014-11-18 DIAGNOSIS — I5042 Chronic combined systolic (congestive) and diastolic (congestive) heart failure: Secondary | ICD-10-CM | POA: Diagnosis not present

## 2014-11-18 DIAGNOSIS — G4733 Obstructive sleep apnea (adult) (pediatric): Secondary | ICD-10-CM | POA: Diagnosis not present

## 2014-11-18 DIAGNOSIS — Z8572 Personal history of non-Hodgkin lymphomas: Secondary | ICD-10-CM | POA: Diagnosis not present

## 2014-11-18 DIAGNOSIS — I129 Hypertensive chronic kidney disease with stage 1 through stage 4 chronic kidney disease, or unspecified chronic kidney disease: Secondary | ICD-10-CM | POA: Diagnosis not present

## 2014-11-18 DIAGNOSIS — Z951 Presence of aortocoronary bypass graft: Secondary | ICD-10-CM | POA: Diagnosis not present

## 2014-11-18 DIAGNOSIS — I480 Paroxysmal atrial fibrillation: Secondary | ICD-10-CM | POA: Insufficient documentation

## 2014-11-18 DIAGNOSIS — I255 Ischemic cardiomyopathy: Secondary | ICD-10-CM | POA: Insufficient documentation

## 2014-11-18 DIAGNOSIS — Z9981 Dependence on supplemental oxygen: Secondary | ICD-10-CM | POA: Insufficient documentation

## 2014-11-18 NOTE — Patient Instructions (Signed)
We will contact you in 3 months to schedule your next appointment.  

## 2014-11-18 NOTE — Progress Notes (Signed)
[name], female   DOB: 06/04/1940, 74 y.o.   MRN: [id number]  PCP: Dr Charlett Blake Nephrologist: Dr Marval Regal EP: Dr Lovena Le  Pulmonary: Dr Gwenette Greet  HPI: Destiny [name] is a 74 y.o. female with a history of CAD, status post CABG in 2003, ischemic cardiomyopathy with variable ejection fractions noted in the past (echo in 12/2011 demonstrated normal LV function with an EF 60-65%), status post CRT-D, chronic combined systolic and diastolic CHF, paroxysmal atrial fibrillation, prior LV mural thrombus, on chronic Coumadin therapy, CKD, T2DM. She has a prior admission to the hospital for acute renal failure in the setting of rhabdomyolysis (while taking colchicine and fenofibrate). Carotid US (12/14): Bilateral ICA stenosis 40-59%.    She was admitted 10/23-11/11/14 with acute on chronic combined systolic and diastolic CHF. ECHO with worsening LV function with an EF of 35-40%. Hospital stay was complicated by a/c renal failure and respiratory failure. Had short term intubation. She was made a DNR/DNI.  Discharge weight 256 lbs. She is on home oxygen 2L by nasal cannula and CPAP at night, OHS/OSA.   She returns for follow up.  Since the last visit she was evaluated by Dr Lovena Le with plans for DC-CV. Overall feeling pretty good. Weight at home had not been above 261 pounds. Mild dyspnea with exertion. Wears 3 liters [place] oxygen. Using CPAP nightly. Rides in motorized scooter.Taking all medications. Tries to follow low salt diet and limit salt intake. Uses a walker in the house.  No bleeding problems.   ECHO (03/06/13): EF 35-40%  ECHO (09/07/13): EF 40%, grade II diastolic dysfunction, mildly decreased RV systolic function with normal RV size.  Carotid dopplers (1/16) with 40-59% bilateral ICA stenosis.   Labs  05/01/13 K 5.4 Creatinine 1.46 Pro BNP 646  05/19/13 K 5.0 Creatinine 1.38 3/15 LDL 41, HDL 38, creatinine 1.7 4/15 K 5.2, creatinine 1.8 09/16/13 K 4.6 Creatinine 1.6 10/15 K 5.2,  creatinine 1.6, LDL 71, HDL 33, TGs 425, TSH normal 12/15 LDL 65, TGs 337 11/05/2014; K 4.8 Creatinine 3.49   ROS: All systems negative except as listed in HPI, PMH and Problem List.  Past Medical History  Diagnosis Date  . LBBB (left bundle branch block)   . Carotid stenosis     a. 10/19/2011 carotid duplex - Mild hard plaque bilaterally. Stable 40-59% bilateral ICA stenosis. Carotid US (04/2013):  Bilateral 40-59% ICA.  F/u 1 year  . Dyslipidemia   . Hypertension   . Chronic combined systolic and diastolic CHF (congestive heart failure)     a. EF as low as 25% in 2006;  b. EF 60-65% in 12/2011;  c. 02/2013 Echo: EF 35-40, mod mid-dist antsept HK, Gr 2 DD, mild LVH.  . Back pain, chronic     "just when I walk; mass on 3rd and 4th vertebrae right lower back"  . Cardiomyopathy, ischemic     a. 2012 s/p SJM 3231-40 Uni BiV ICD, ser # [id number].  Marland Kitchen CAD (coronary artery disease)     a. 05/2001 CABG x4: LIMA->LAD, VG->D1, VG->D2, VG->RCA.  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 CKD (chronic kidney disease), stage III   . Biventricular implantable cardiac defibrillator in situ 2007, 2012    a. 2007;  b. 2012 Gen change: SJM 3231-40 Uni BiV ICD, ser # [id number].  Marland Kitchen Hypoxemia requiring supplemental oxygen   . Candidiasis of vulva and vagina   . Hypothyroidism   . 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 Benay Spice, in remission  . History of pneumonia 12/27/11    "3 times; it's been a long time ago"  . OSA on CPAP   . Gout ~ 08/2011  . PAF (paroxysmal atrial fibrillation)     on Coumadin  . DM (diabetes mellitus), type 2 with complications     insulin dependent, retinopathy, neuropathy  . Mural thrombus of left ventricle     before 2003, while on Coumadin, No h/o CVA  . Morbid obesity with BMI of 45.0-49.9, adult     Ht. 5'4". BMI 47.2  . Vitamin D deficiency 06/09/2012    historical  . Olecranon bursitis of right elbow 12/29/2012   . History of recurrent UTIs   . Diabetes 05/24/2013  . Hypercalcemia 09/26/2013  . Anemia, unspecified 12/13/2013  . Renal insufficiency 03/14/2014    Following with [facility], Dr Loyal Buba  . Medicare annual wellness visit, subsequent 09/12/2014    Medication: Reviewed with patient and updated  Preferred Pharmacy and which med where: 90 day supply/mail order: [place] [facility]: [place]  DM supplies: [facility]   Allergies verified: UTD   Immunization Status: Flu vaccine-- 01/12/14 Tdap-- 01/28/12 PNA-- 03/11/13 (23)  Shingles--08/19/07  MMG-- 06/25/08 with Johnnette Gourd, MD at [facility]    Current Outpatient Prescriptions  Medication Sig Dispense Refill  . acetaminophen (TYLENOL) 500 MG tablet Take 1,000 mg by mouth every 6 (six) hours as needed (pain).    Marland Kitchen albuterol (PROVENTIL HFA;VENTOLIN HFA) 108 (90 BASE) MCG/ACT inhaler Inhale 2 puffs into the lungs every 6 (six) hours as needed for wheezing or shortness of breath. 1 Inhaler 1  . amiodarone (PACERONE) 200 MG tablet Take 1 tablet (200 mg total) by mouth 2 (two) times daily. 180 tablet 1  . atorvastatin (LIPITOR) 20 MG tablet Take 1 tablet (20 mg total) by mouth daily. 90 tablet 1  . carvedilol (COREG) 25 MG tablet Take 2 tablets by mouth 2 (two) times daily.     Marland Kitchen escitalopram (LEXAPRO) 10 MG tablet Take 1 tablet (10 mg total) by mouth daily. 90 tablet 1  . fenofibrate (TRICOR) 48 MG tablet Take 1 tablet (48 mg total) by mouth daily. 90 tablet 3  . fluticasone (FLONASE) 50 MCG/ACT nasal spray Place 2 sprays into both nostrils daily as needed for allergies or rhinitis. As needed for nasal stuffiness. 16 g 0  . gabapentin (NEURONTIN) 300 MG capsule Take 1 capsule (300 mg total) by mouth 2 (two) times daily. 180 capsule 1  . glucose blood (FREESTYLE LITE) test strip DX: 250.60  Check sugars bid and as needed 100 each 2  . insulin NPH (HUMULIN N,NOVOLIN N) 100 UNIT/ML injection Inject  20-45 Units into the skin 2 (two) times daily before a meal. Take 45 units in the morning and 15 units in the evening    . insulin regular (NOVOLIN R,HUMULIN R) 100 units/mL injection Change to 15 units three times a with meals (to cover all meals--this is the same total amount but spread out) (Patient taking differently: Inject 15 Units into the skin 2 (two) times daily before a meal. Change to 15 units three times a with meals (to cover all meals--this is the same total amount but spread out)) 10 mL 12  . levothyroxine (SYNTHROID, LEVOTHROID) 25 MCG tablet Take 25 mcg by mouth daily before breakfast.    . LORazepam (ATIVAN) 0.5 MG tablet Take 1  tablet (0.5 mg total) by mouth at bedtime as needed for anxiety. 90 tablet 1  . NON FORMULARY Oxygen---24 hour    . torsemide (DEMADEX) 20 MG tablet Take 40mg  in morning and 20mg  in evening. May take an extra tab for 3# weight gain, swelling or SOB 135 tablet 3  . Vitamin D, Ergocalciferol, (DRISDOL) 50000 UNITS CAPS capsule Take 50,000 Units by mouth every 7 (seven) days. Tuesdays    . warfarin (COUMADIN) 5 MG tablet Take 1 tablet (5 mg total) by mouth daily at 6 PM.     No current facility-administered medications for this encounter.    Filed Vitals:   11/18/14 1201  BP: 104/60  Pulse: 72  Weight: 257 lb 8 oz (116.801 kg)  SpO2: 92%     PHYSICAL EXAM: General:  Obese. No resp difficulty; on 2 L continuous. Husband present . Arrived in scooter.  HEENT: normal Neck: supple. JVP 6-7 cm. Carotids 2+ bilaterally; no bruits. No lymphadenopathy or thryomegaly appreciated. Cor: PMI normal. Regular rate & rhythm. No rubs, gallops.  1/6 early SEM RUSB. Lungs: clear Abdomen: Obese, soft, nontender, obese, nondistended. No hepatosplenomegaly. No bruits or masses. Good bowel sounds. Extremities: no cyanosis, clubbing, rash. No edema.  Neuro: alert & orientedx3, cranial nerves grossly intact. Moves all 4 extremities w/o difficulty. Affect  pleasant.  ASSESSMENT & PLAN:   1) Chronic systolic HF: Ischemic cardiomyopathy s/p CRT-D (St. Jude). Echo 10/20/2014: EF ECHO ~30%% limited images floating mitral valve cord noted by Dr Haroldine Laws NYHA III symptoms.Volume status stable. Continue torsemide to 20 mg twice a day with an extra 20 mg as needed.  - Continue current Coreg 25 mg twice a day.  - No spironolactone for given CKD.  -No Ace with CKD.  - May need to consider hydralazine/imdur next visit.   - Reinforced the need and importance of daily weights, a low sodium diet, and fluid restriction (less than 2 L a day). Instructed to call the HF clinic if weight increases more than 3 lbs overnight or 5 lbs in a week. 2) PAF: S/P DC-CV 07/28/14. Back in Afib and concern for worsening EF.  Amiodarone started 10/13/2014 by Dr Lovena Le.  Yearly eye exam. Continue BB and Coumadin. Has INR checked weekly.  No bleeding problems. Plan for DC-CV next week.   3) OHS/OSA: Continue nightly CPAP and home oxygen.  4) CAD: s/p CABG.  No chest pain.  She is not on ASA given comadin use and stable CAD. Restart statin => she can tolerate atorvastatin 20 but not 40 due to myalgias.  5) Hyperlipidemia: Continue atorvastatin 20 mg daily (she has been able to tolerate this dose). Triglycerides very high, continue fenofibrate 48 mg daily.  6) Carotid stenosis: Repeat dopplers in 1/17.  7) CKD. Stage III:  Followed by Dr Marval Regal.    Follow up in 3 month  Bridger Pizzi NP-C  11/18/2014

## 2014-11-23 ENCOUNTER — Other Ambulatory Visit (INDEPENDENT_AMBULATORY_CARE_PROVIDER_SITE_OTHER): Payer: Medicare Other | Admitting: *Deleted

## 2014-11-23 ENCOUNTER — Telehealth: Payer: Self-pay | Admitting: Internal Medicine

## 2014-11-23 DIAGNOSIS — I4891 Unspecified atrial fibrillation: Secondary | ICD-10-CM | POA: Diagnosis not present

## 2014-11-23 LAB — CBC WITH DIFFERENTIAL/PLATELET
BASOS ABS: 0 10*3/uL (ref 0.0–0.1)
Basophils Relative: 0.4 % (ref 0.0–3.0)
EOS PCT: 2.3 % (ref 0.0–5.0)
Eosinophils Absolute: 0.2 10*3/uL (ref 0.0–0.7)
HEMATOCRIT: 31.6 % — AB (ref 36.0–46.0)
Hemoglobin: 10.5 g/dL — ABNORMAL LOW (ref 12.0–15.0)
LYMPHS PCT: 20.5 % (ref 12.0–46.0)
Lymphs Abs: 1.4 10*3/uL (ref 0.7–4.0)
MCHC: 33.3 g/dL (ref 30.0–36.0)
MCV: 88.1 fl (ref 78.0–100.0)
Monocytes Absolute: 0.6 10*3/uL (ref 0.1–1.0)
Monocytes Relative: 8.9 % (ref 3.0–12.0)
NEUTROS ABS: 4.6 10*3/uL (ref 1.4–7.7)
NEUTROS PCT: 67.9 % (ref 43.0–77.0)
Platelets: 168 10*3/uL (ref 150.0–400.0)
RBC: 3.59 Mil/uL — AB (ref 3.87–5.11)
RDW: 15.6 % — ABNORMAL HIGH (ref 11.5–15.5)
WBC: 6.8 10*3/uL (ref 4.0–10.5)

## 2014-11-23 LAB — BASIC METABOLIC PANEL
BUN: 64 mg/dL — ABNORMAL HIGH (ref 6–23)
CALCIUM: 9.2 mg/dL (ref 8.4–10.5)
CO2: 34 meq/L — AB (ref 19–32)
Chloride: 98 mEq/L (ref 96–112)
Creatinine, Ser: 2.88 mg/dL — ABNORMAL HIGH (ref 0.40–1.20)
GFR: 17.08 mL/min — AB (ref >60.00)
Glucose, Bld: 100 mg/dL — ABNORMAL HIGH (ref 70–99)
POTASSIUM: 4.4 meq/L (ref 3.5–5.1)
SODIUM: 138 meq/L (ref 135–145)

## 2014-11-23 LAB — PROTIME-INR
INR: 3.6 ratio — ABNORMAL HIGH (ref 0.8–1.0)
Prothrombin Time: 38.5 s — ABNORMAL HIGH (ref 9.6–13.1)

## 2014-11-23 NOTE — Telephone Encounter (Signed)
Late entry: notified Ward on 7/816 that the patient is scheduled for a DCCV on 11/25/14 at 1:00 pm with Dr. Lovena Le at [facility].

## 2014-11-24 ENCOUNTER — Ambulatory Visit (INDEPENDENT_AMBULATORY_CARE_PROVIDER_SITE_OTHER): Payer: Medicare Other | Admitting: Pharmacist

## 2014-11-24 ENCOUNTER — Other Ambulatory Visit (HOSPITAL_COMMUNITY): Payer: Self-pay | Admitting: *Deleted

## 2014-11-24 DIAGNOSIS — I4891 Unspecified atrial fibrillation: Secondary | ICD-10-CM | POA: Diagnosis not present

## 2014-11-24 DIAGNOSIS — I129 Hypertensive chronic kidney disease with stage 1 through stage 4 chronic kidney disease, or unspecified chronic kidney disease: Secondary | ICD-10-CM | POA: Diagnosis not present

## 2014-11-24 DIAGNOSIS — I5043 Acute on chronic combined systolic (congestive) and diastolic (congestive) heart failure: Secondary | ICD-10-CM | POA: Diagnosis not present

## 2014-11-24 DIAGNOSIS — N183 Chronic kidney disease, stage 3 (moderate): Secondary | ICD-10-CM | POA: Diagnosis not present

## 2014-11-24 DIAGNOSIS — E1129 Type 2 diabetes mellitus with other diabetic kidney complication: Secondary | ICD-10-CM | POA: Diagnosis not present

## 2014-11-24 DIAGNOSIS — Z5181 Encounter for therapeutic drug level monitoring: Secondary | ICD-10-CM

## 2014-11-24 DIAGNOSIS — C8590 Non-Hodgkin lymphoma, unspecified, unspecified site: Secondary | ICD-10-CM | POA: Diagnosis not present

## 2014-11-24 DIAGNOSIS — I480 Paroxysmal atrial fibrillation: Secondary | ICD-10-CM

## 2014-11-24 LAB — POCT INR: INR: 3.4

## 2014-11-25 ENCOUNTER — Encounter (HOSPITAL_COMMUNITY): Payer: Self-pay | Admitting: *Deleted

## 2014-11-25 ENCOUNTER — Encounter (HOSPITAL_COMMUNITY)
Admission: RE | Disposition: A | Discharge status: Home / Self Care | Payer: Self-pay | Source: Ambulatory Visit | Attending: Internal Medicine

## 2014-11-25 ENCOUNTER — Ambulatory Visit (HOSPITAL_COMMUNITY): Payer: Medicare Other | Admitting: Anesthesiology

## 2014-11-25 ENCOUNTER — Ambulatory Visit (HOSPITAL_COMMUNITY)
Admission: RE | Admit: 2014-11-25 | Discharge: 2014-11-25 | Disposition: A | Discharge status: Home / Self Care | Payer: Medicare Other | Source: Ambulatory Visit | Attending: Internal Medicine | Admitting: Internal Medicine

## 2014-11-25 DIAGNOSIS — N183 Chronic kidney disease, stage 3 (moderate): Secondary | ICD-10-CM | POA: Insufficient documentation

## 2014-11-25 DIAGNOSIS — J9621 Acute and chronic respiratory failure with hypoxia: Secondary | ICD-10-CM | POA: Diagnosis present

## 2014-11-25 DIAGNOSIS — J449 Chronic obstructive pulmonary disease, unspecified: Secondary | ICD-10-CM | POA: Insufficient documentation

## 2014-11-25 DIAGNOSIS — I509 Heart failure, unspecified: Secondary | ICD-10-CM | POA: Diagnosis not present

## 2014-11-25 DIAGNOSIS — E1140 Type 2 diabetes mellitus with diabetic neuropathy, unspecified: Secondary | ICD-10-CM

## 2014-11-25 DIAGNOSIS — Z95810 Presence of automatic (implantable) cardiac defibrillator: Secondary | ICD-10-CM

## 2014-11-25 DIAGNOSIS — I129 Hypertensive chronic kidney disease with stage 1 through stage 4 chronic kidney disease, or unspecified chronic kidney disease: Secondary | ICD-10-CM | POA: Insufficient documentation

## 2014-11-25 DIAGNOSIS — Z79899 Other long term (current) drug therapy: Secondary | ICD-10-CM

## 2014-11-25 DIAGNOSIS — I255 Ischemic cardiomyopathy: Secondary | ICD-10-CM | POA: Diagnosis present

## 2014-11-25 DIAGNOSIS — G8929 Other chronic pain: Secondary | ICD-10-CM

## 2014-11-25 DIAGNOSIS — M109 Gout, unspecified: Secondary | ICD-10-CM | POA: Insufficient documentation

## 2014-11-25 DIAGNOSIS — M488X6 Other specified spondylopathies, lumbar region: Secondary | ICD-10-CM | POA: Insufficient documentation

## 2014-11-25 DIAGNOSIS — Z951 Presence of aortocoronary bypass graft: Secondary | ICD-10-CM

## 2014-11-25 DIAGNOSIS — I481 Persistent atrial fibrillation: Secondary | ICD-10-CM | POA: Diagnosis not present

## 2014-11-25 DIAGNOSIS — I480 Paroxysmal atrial fibrillation: Secondary | ICD-10-CM | POA: Insufficient documentation

## 2014-11-25 DIAGNOSIS — E11319 Type 2 diabetes mellitus with unspecified diabetic retinopathy without macular edema: Secondary | ICD-10-CM

## 2014-11-25 DIAGNOSIS — I5043 Acute on chronic combined systolic (congestive) and diastolic (congestive) heart failure: Secondary | ICD-10-CM | POA: Diagnosis not present

## 2014-11-25 DIAGNOSIS — I4891 Unspecified atrial fibrillation: Secondary | ICD-10-CM | POA: Diagnosis not present

## 2014-11-25 DIAGNOSIS — E039 Hypothyroidism, unspecified: Secondary | ICD-10-CM

## 2014-11-25 DIAGNOSIS — Z9981 Dependence on supplemental oxygen: Secondary | ICD-10-CM

## 2014-11-25 DIAGNOSIS — Z7901 Long term (current) use of anticoagulants: Secondary | ICD-10-CM

## 2014-11-25 DIAGNOSIS — I2510 Atherosclerotic heart disease of native coronary artery without angina pectoris: Secondary | ICD-10-CM | POA: Diagnosis present

## 2014-11-25 DIAGNOSIS — Z6841 Body Mass Index (BMI) 40.0 and over, adult: Secondary | ICD-10-CM | POA: Diagnosis not present

## 2014-11-25 DIAGNOSIS — E785 Hyperlipidemia, unspecified: Secondary | ICD-10-CM

## 2014-11-25 DIAGNOSIS — G4733 Obstructive sleep apnea (adult) (pediatric): Secondary | ICD-10-CM | POA: Insufficient documentation

## 2014-11-25 DIAGNOSIS — Z9989 Dependence on other enabling machines and devices: Secondary | ICD-10-CM | POA: Insufficient documentation

## 2014-11-25 DIAGNOSIS — Z8572 Personal history of non-Hodgkin lymphomas: Secondary | ICD-10-CM

## 2014-11-25 DIAGNOSIS — E1165 Type 2 diabetes mellitus with hyperglycemia: Secondary | ICD-10-CM

## 2014-11-25 DIAGNOSIS — Z794 Long term (current) use of insulin: Secondary | ICD-10-CM

## 2014-11-25 DIAGNOSIS — E559 Vitamin D deficiency, unspecified: Secondary | ICD-10-CM

## 2014-11-25 DIAGNOSIS — R0602 Shortness of breath: Secondary | ICD-10-CM | POA: Diagnosis not present

## 2014-11-25 DIAGNOSIS — I517 Cardiomegaly: Secondary | ICD-10-CM | POA: Diagnosis present

## 2014-11-25 DIAGNOSIS — I5042 Chronic combined systolic (congestive) and diastolic (congestive) heart failure: Secondary | ICD-10-CM

## 2014-11-25 DIAGNOSIS — Z90710 Acquired absence of both cervix and uterus: Secondary | ICD-10-CM

## 2014-11-25 DIAGNOSIS — Z882 Allergy status to sulfonamides status: Secondary | ICD-10-CM

## 2014-11-25 DIAGNOSIS — I447 Left bundle-branch block, unspecified: Secondary | ICD-10-CM | POA: Diagnosis present

## 2014-11-25 DIAGNOSIS — Z9842 Cataract extraction status, left eye: Secondary | ICD-10-CM

## 2014-11-25 DIAGNOSIS — E1122 Type 2 diabetes mellitus with diabetic chronic kidney disease: Secondary | ICD-10-CM | POA: Diagnosis not present

## 2014-11-25 HISTORY — PX: CARDIOVERSION: SHX1299

## 2014-11-25 LAB — GLUCOSE, CAPILLARY: Glucose-Capillary: 101 mg/dL — ABNORMAL HIGH (ref 65–99)

## 2014-11-25 SURGERY — CARDIOVERSION
Anesthesia: General

## 2014-11-25 MED ORDER — PROPOFOL 10 MG/ML IV BOLUS
INTRAVENOUS | Status: DC | PRN
  Administered 2014-11-25: 40 mg via INTRAVENOUS
  Administered 2014-11-25: 50 mg via INTRAVENOUS

## 2014-11-25 MED ORDER — SODIUM CHLORIDE 0.9 % IV SOLN
INTRAVENOUS | Status: DC
  Administered 2014-11-25: 12:00:00 via INTRAVENOUS

## 2014-11-25 NOTE — Interval H&P Note (Signed)
History and Physical Interval Note:  11/25/2014 1:17 PM  Destiny [name]  has presented today for surgery, with the diagnosis of afib  The various methods of treatment have been discussed with the patient and family. After consideration of risks, benefits and other options for treatment, the patient has consented to  Procedure(s): CARDIOVERSION (N/A) as a surgical intervention .  The patient's history has been reviewed, patient examined, no change in status, stable for surgery.  I have reviewed the patient's chart and labs.  Questions were answered to the patient's satisfaction.     Mikle Bosworth.D.

## 2014-11-25 NOTE — Discharge Instructions (Signed)

## 2014-11-25 NOTE — Anesthesia Preprocedure Evaluation (Signed)
Anesthesia Evaluation  Patient identified by MRN, date of birth, ID band Patient awake    Reviewed: Allergy & Precautions, NPO status , Patient's Chart, lab work & pertinent test results  Airway Mallampati: II  TM Distance: >3 FB Neck ROM: Full    Dental  (+) Teeth Intact   Pulmonary  breath sounds clear to auscultation        Cardiovascular hypertension, Rhythm:Irregular Rate:Normal     Neuro/Psych    GI/Hepatic   Endo/Other  diabetes  Renal/GU      Musculoskeletal   Abdominal (+) + obese,   Peds  Hematology   Anesthesia Other Findings   Reproductive/Obstetrics                             Anesthesia Physical Anesthesia Plan  ASA: III  Anesthesia Plan: General   Post-op Pain Management:    Induction: Intravenous  Airway Management Planned: Mask  Additional Equipment:   Intra-op Plan:   Post-operative Plan:   Informed Consent: I have reviewed the patients History and Physical, chart, labs and discussed the procedure including the risks, benefits and alternatives for the proposed anesthesia with the patient or authorized representative who has indicated his/her understanding and acceptance.     Plan Discussed with: CRNA and Anesthesiologist  Anesthesia Plan Comments:         Anesthesia Quick Evaluation

## 2014-11-25 NOTE — Anesthesia Postprocedure Evaluation (Signed)
  Anesthesia Post-op Note  Patient: Destiny [name]  Procedure(s) Performed: Procedure(s): CARDIOVERSION (N/A)  Patient Location: Endoscopy Unit  Anesthesia Type:General  Level of Consciousness: awake, alert  and oriented  Airway and Oxygen Therapy: Patient Spontanous Breathing and Patient connected to nasal cannula oxygen  Post-op Pain: none  Post-op Assessment: Post-op Vital signs reviewed, Patient's Cardiovascular Status Stable, Respiratory Function Stable, Patent Airway and Pain level controlled              Post-op Vital Signs: stable  Last Vitals:  Filed Vitals:   11/25/14 1350  BP: 146/82  Pulse: 69  Temp:   Resp: 14    Complications: No apparent anesthesia complications

## 2014-11-25 NOTE — H&P (View-Only) (Signed)
HPI Destiny [name] returns today for followup. She is a very pleasant 74 year old woman with an ischemic cardiomyopathy, chronic systolic heart failure, left bundle branch block, status post biventricular ICD implantation.  She is chronically oxygen dependent and desaturates at rest without oxygen. She does have morbid obesity and hypertension. She notes mild peripheral edema. No syncope. When I saw her last, she was not doing well. She has undergone PFT's and was rehospitalized and treated for pneumonia and copd. She feels better. She has been on amiodarone. She has been therapeutically anti-coagulated.    Allergies  Allergen Reactions  . Sulfonamide Derivatives Other (See Comments)    Unknown; "childhood allergy/mother"     Current Outpatient Prescriptions  Medication Sig Dispense Refill  . acetaminophen (TYLENOL) 500 MG tablet Take 1,000 mg by mouth every 6 (six) hours as needed (pain).    Marland Kitchen albuterol (PROVENTIL HFA;VENTOLIN HFA) 108 (90 BASE) MCG/ACT inhaler Inhale 2 puffs into the lungs every 6 (six) hours as needed for wheezing or shortness of breath. 1 Inhaler 1  . amiodarone (PACERONE) 200 MG tablet Take 1 tablet (200 mg total) by mouth 2 (two) times daily. 180 tablet 1  . atorvastatin (LIPITOR) 20 MG tablet Take 1 tablet (20 mg total) by mouth daily. 90 tablet 1  . carvedilol (COREG) 25 MG tablet Take 2 tablets by mouth 2 (two) times daily.     Marland Kitchen escitalopram (LEXAPRO) 10 MG tablet Take 1 tablet (10 mg total) by mouth daily. 90 tablet 1  . fenofibrate (TRICOR) 48 MG tablet Take 1 tablet (48 mg total) by mouth daily. 90 tablet 3  . fluticasone (FLONASE) 50 MCG/ACT nasal spray Place 2 sprays into both nostrils daily as needed for allergies or rhinitis. As needed for nasal stuffiness. 16 g 0  . gabapentin (NEURONTIN) 300 MG capsule Take 1 capsule (300 mg total) by mouth 2 (two) times daily. 180 capsule 1  . glucose blood (FREESTYLE LITE) test strip DX: 250.60  Check sugars bid and as  needed 100 each 2  . insulin NPH (HUMULIN N,NOVOLIN N) 100 UNIT/ML injection Inject 20-45 Units into the skin 2 (two) times daily before a meal. Take 45 units in the morning and 15 units in the evening    . insulin regular (NOVOLIN R,HUMULIN R) 100 units/mL injection Change to 15 units three times a with meals (to cover all meals--this is the same total amount but spread out) (Patient taking differently: Inject 15 Units into the skin 2 (two) times daily before a meal. Change to 15 units three times a with meals (to cover all meals--this is the same total amount but spread out)) 10 mL 12  . levothyroxine (SYNTHROID, LEVOTHROID) 25 MCG tablet Take 25 mcg by mouth daily before breakfast.    . LORazepam (ATIVAN) 0.5 MG tablet Take 1 tablet (0.5 mg total) by mouth at bedtime as needed for anxiety. 90 tablet 1  . NON FORMULARY Oxygen---24 hour    . torsemide (DEMADEX) 20 MG tablet Take 40mg  in morning and 20mg  in evening. May take an extra tab for 3# weight gain, swelling or SOB 135 tablet 3  . Vitamin D, Ergocalciferol, (DRISDOL) 50000 UNITS CAPS capsule Take 50,000 Units by mouth every 7 (seven) days. Tuesdays    . warfarin (COUMADIN) 5 MG tablet Take 1 tablet (5 mg total) by mouth daily at 6 PM.     No current facility-administered medications for this visit.     Past Medical History  Diagnosis Date  .  LBBB (left bundle branch block)   . Carotid stenosis     a. 10/19/2011 carotid duplex - Mild hard plaque bilaterally. Stable 40-59% bilateral ICA stenosis. Carotid US (04/2013):  Bilateral 40-59% ICA.  F/u 1 year  . Dyslipidemia   . Hypertension   . Chronic combined systolic and diastolic CHF (congestive heart failure)     a. EF as low as 25% in 2006;  b. EF 60-65% in 12/2011;  c. 02/2013 Echo: EF 35-40, mod mid-dist antsept HK, Gr 2 DD, mild LVH.  . Back pain, chronic     "just when I walk; mass on 3rd and 4th vertebrae right lower back"  . Cardiomyopathy, ischemic     a. 2012 s/p SJM 3231-40 Uni  BiV ICD, ser # [id number].  Marland Kitchen CAD (coronary artery disease)     a. 05/2001 CABG x4: LIMA->LAD, VG->D1, VG->D2, VG->RCA.  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 CKD (chronic kidney disease), stage III   . Biventricular implantable cardiac defibrillator in situ 2007, 2012    a. 2007;  b. 2012 Gen change: SJM 3231-40 Uni BiV ICD, ser # [id number].  Marland Kitchen Hypoxemia requiring supplemental oxygen   . Candidiasis of vulva and vagina   . Hypothyroidism   . 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 Benay Spice, in remission  . History of pneumonia 12/27/11    "3 times; it's been a long time ago"  . OSA on CPAP   . Gout ~ 08/2011  . PAF (paroxysmal atrial fibrillation)     on Coumadin  . DM (diabetes mellitus), type 2 with complications     insulin dependent, retinopathy, neuropathy  . Mural thrombus of left ventricle     before 2003, while on Coumadin, No h/o CVA  . Morbid obesity with BMI of 45.0-49.9, adult     Ht. 5'4". BMI 47.2  . Vitamin D deficiency 06/09/2012    historical  . Olecranon bursitis of right elbow 12/29/2012  . History of recurrent UTIs   . Diabetes 05/24/2013  . Hypercalcemia 09/26/2013  . Anemia, unspecified 12/13/2013  . Renal insufficiency 03/14/2014    Following with [facility], Dr Loyal Buba  . Medicare annual wellness visit, subsequent 09/12/2014    Medication: Reviewed with patient and updated  Preferred Pharmacy and which med where: 90 day supply/mail order: [place] [facility]: [place]  DM supplies: [facility]   Allergies verified: UTD   Immunization Status: Flu vaccine-- 01/12/14 Tdap-- 01/28/12 PNA-- 03/11/13 (23)  Shingles--08/19/07  MMG-- 06/25/08 with Johnnette Gourd, MD at [facility]:   All systems reviewed and negative except as noted in the HPI.   Past Surgical History  Procedure Laterality Date  . Cardiac catheterization  06/03/01  .  Cholecystectomy  1970  . Tubal ligation  1972  . Abdominal hysterectomy  1982  . Insert / replace / remove pacemaker  2007; 2012    w/AICD  . Coronary artery bypass graft  2003    CABG X4  . Cataract extraction      left eye  . Cardioversion N/A 07/30/2014    Procedure: CARDIOVERSION;  Surgeon: Jolaine Artist, MD;  Location: [facility] ENDOSCOPY;  Service: Cardiovascular;  Laterality: N/A;  . Refractive surgery      right eye     Family History  Problem Relation Age of Onset  . Kidney cancer Mother  kidney and female repo - died @ 68  . Asthma Mother   . Cancer Mother     gyn and renal  . Heart disease Mother     CAD  . Heart disease Father     died @ 84  . Stroke Father   . Diabetes Father   . Hypertension Father   . Hyperlipidemia Father   . Hypertension Son   . Hypertension Maternal Aunt   . Kidney cancer Maternal Uncle   . Cancer Maternal Uncle   . Cirrhosis Maternal Grandmother     non alcohol  . Cancer Maternal Grandfather   . Kidney cancer Maternal Grandfather      History   Social History  . Marital Status: Married    Spouse Name: N/A  . Number of Children: Y  . Years of Education: N/A   Occupational History  . 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    Social History Main Topics  . Smoking status: Never Smoker   . Smokeless tobacco: Never Used  . Alcohol Use: No  . Drug Use: No  . Sexual Activity: Not Currently     Comment: lives with husband, no major dietary restrictions   Other Topics Concern  . Not on file   Social History Narrative   Lives in [place] with her husband.  She does not routinely exercise or adhere to any particular diet.       BP 118/78 mmHg  Pulse 60  Ht 5\' 4"  (1.626 m)  Wt 258 lb 6.4 oz (117.209 kg)  BMI 44.33 kg/m2  Physical Exam:  stable appearing morbidly obese, 74 year old woman, NAD HEENT: Unremarkable except for oxygen by nasal cannula Neck:  6 cm JVD, no thyromegally Back:  No CVA tenderness Lungs:   Clear except for minimal basilar rales. No wheezes, no rhonchi. Well-healed ICD incision. HEART:  Regular rate rhythm, no murmurs, no rubs, no clicks Abd:  soft, positive bowel sounds, obese, no organomegally, no rebound, no guarding Ext:  2 plus pulses, no edema, no cyanosis, no clubbing Skin:  No rashes no nodules Neuro:  CN II through XII intact, motor grossly intact  DEVICE  Normal device function.  See PaceArt for details. BiV pacing is 69%  Assess/Plan:

## 2014-11-25 NOTE — Transfer of Care (Signed)
Immediate Anesthesia Transfer of Care Note  Patient: Destiny [name]  Procedure(s) Performed: Procedure(s): CARDIOVERSION (N/A)  Patient Location: PACU and Endoscopy Unit  Anesthesia Type:General  Level of Consciousness: awake, alert , oriented and sedated  Airway & Oxygen Therapy: Patient Spontanous Breathing and Patient connected to nasal cannula oxygen  Post-op Assessment: Report given to RN, Post -op Vital signs reviewed and stable and Patient moving all extremities  Post vital signs: Reviewed and stable  Last Vitals:  Filed Vitals:   11/25/14 1316  BP:   Pulse:   Temp:   Resp: 16    Complications: No apparent anesthesia complications

## 2014-11-25 NOTE — CV Procedure (Signed)
EP Procedure Note  Procedure: DC Cardioversion  Preoperative diagnosis: atrial fibrillation  Postoperative diagnosis: atrial fibrillation  Description of the Procedure: after informed consent was obtained, the patient was prepped in the usual manner. The electrodispersive pad was placed in the AP position. The patient was sedated deeply under the direction of Dr. Linna Caprice with IV propafol. She was cardioverted with 200 joules of synchronized biphasic energy, restoring NSR. She tolerated the procedure well.  Complication: none immediately  Conclusion: successful DCCV in a patient with persistent atrial fibrillation, now on amiodarone.  Mikle Bosworth.D.

## 2014-11-26 ENCOUNTER — Emergency Department (HOSPITAL_COMMUNITY): Payer: Medicare Other

## 2014-11-26 ENCOUNTER — Inpatient Hospital Stay (HOSPITAL_COMMUNITY)
Admission: EM | Admit: 2014-11-26 | Discharge: 2014-11-28 | DRG: 291 | Disposition: A | Discharge status: Home / Self Care | Payer: Medicare Other | Attending: Internal Medicine | Admitting: Internal Medicine

## 2014-11-26 ENCOUNTER — Encounter (HOSPITAL_COMMUNITY): Payer: Self-pay | Admitting: Internal Medicine

## 2014-11-26 DIAGNOSIS — I509 Heart failure, unspecified: Secondary | ICD-10-CM | POA: Diagnosis not present

## 2014-11-26 DIAGNOSIS — I5043 Acute on chronic combined systolic (congestive) and diastolic (congestive) heart failure: Secondary | ICD-10-CM | POA: Diagnosis present

## 2014-11-26 DIAGNOSIS — E1122 Type 2 diabetes mellitus with diabetic chronic kidney disease: Secondary | ICD-10-CM | POA: Diagnosis present

## 2014-11-26 DIAGNOSIS — R0602 Shortness of breath: Secondary | ICD-10-CM

## 2014-11-26 DIAGNOSIS — I480 Paroxysmal atrial fibrillation: Secondary | ICD-10-CM | POA: Diagnosis present

## 2014-11-26 DIAGNOSIS — E039 Hypothyroidism, unspecified: Secondary | ICD-10-CM | POA: Diagnosis present

## 2014-11-26 DIAGNOSIS — R609 Edema, unspecified: Secondary | ICD-10-CM

## 2014-11-26 DIAGNOSIS — J9622 Acute and chronic respiratory failure with hypercapnia: Secondary | ICD-10-CM | POA: Diagnosis present

## 2014-11-26 LAB — PROTIME-INR
INR: 2.36 — AB (ref 0.00–1.49)
PROTHROMBIN TIME: 25.5 s — AB (ref 11.6–15.2)

## 2014-11-26 LAB — BASIC METABOLIC PANEL
Anion gap: 10 (ref 5–15)
BUN: 58 mg/dL — ABNORMAL HIGH (ref 6–20)
CO2: 30 mmol/L (ref 22–32)
CREATININE: 2.86 mg/dL — AB (ref 0.44–1.00)
Calcium: 8.8 mg/dL — ABNORMAL LOW (ref 8.9–10.3)
Chloride: 97 mmol/L — ABNORMAL LOW (ref 101–111)
GFR calc Af Amer: 18 mL/min — ABNORMAL LOW (ref >60)
GFR, EST NON AFRICAN AMERICAN: 15 mL/min — AB (ref >60)
GLUCOSE: 211 mg/dL — AB (ref 65–99)
POTASSIUM: 4.6 mmol/L (ref 3.5–5.1)
SODIUM: 137 mmol/L (ref 135–145)

## 2014-11-26 LAB — I-STAT VENOUS BLOOD GAS, ED
Acid-Base Excess: 5 mmol/L — ABNORMAL HIGH (ref 0.0–2.0)
Bicarbonate: 30.9 mEq/L — ABNORMAL HIGH (ref 20.0–24.0)
O2 Saturation: 47 %
TCO2: 32 mmol/L (ref 0–100)
pCO2, Ven: 53.1 mmHg — ABNORMAL HIGH (ref 45.0–50.0)
pH, Ven: 7.373 — ABNORMAL HIGH (ref 7.250–7.300)
pO2, Ven: 27 mmHg — CL (ref 30.0–45.0)

## 2014-11-26 LAB — I-STAT TROPONIN, ED: TROPONIN I, POC: 0.03 ng/mL (ref 0.00–0.08)

## 2014-11-26 LAB — CBC
HCT: 34.2 % — ABNORMAL LOW (ref 36.0–46.0)
Hemoglobin: 10.7 g/dL — ABNORMAL LOW (ref 12.0–15.0)
MCH: 28.7 pg (ref 26.0–34.0)
MCHC: 31.3 g/dL (ref 30.0–36.0)
MCV: 91.7 fL (ref 78.0–100.0)
PLATELETS: 172 10*3/uL (ref 150–400)
RBC: 3.73 MIL/uL — AB (ref 3.87–5.11)
RDW: 14.8 % (ref 11.5–15.5)
WBC: 9.9 10*3/uL (ref 4.0–10.5)

## 2014-11-26 LAB — BRAIN NATRIURETIC PEPTIDE: B Natriuretic Peptide: 298.3 pg/mL — ABNORMAL HIGH (ref 0.0–100.0)

## 2014-11-26 MED ORDER — FUROSEMIDE 10 MG/ML IJ SOLN
60.0000 mg | INTRAMUSCULAR | Status: AC
  Administered 2014-11-26: 60 mg via INTRAVENOUS
  Filled 2014-11-26: qty 6

## 2014-11-26 MED ORDER — FUROSEMIDE 10 MG/ML IJ SOLN: 80.0000 mg | INTRAMUSCULAR | Status: DC

## 2014-11-26 MED ORDER — LORAZEPAM 2 MG/ML IJ SOLN
0.5000 mg | INTRAMUSCULAR | Status: DC | PRN
Start: 2014-11-26 — End: 2014-11-28

## 2014-11-26 MED ORDER — NITROGLYCERIN 0.4 MG SL SUBL
0.4000 mg | SUBLINGUAL_TABLET | SUBLINGUAL | Status: DC | PRN
Start: 2014-11-26 — End: 2014-11-28

## 2014-11-26 NOTE — ED Provider Notes (Signed)
CSN: [id number]     Arrival date & time 11/26/14  2216 History   First MD Initiated Contact with Patient 11/26/14 2248     Chief Complaint  Patient presents with  . Shortness of Breath    (Consider location/radiation/quality/duration/timing/severity/associated sxs/prior Treatment) HPI Comments: Patient is a 74 y/o female with a hx of ischemic cardiomyopathy, chronic systolic heart failure (LVEF 25-30%), left bundle branch block, s/p biventricular ICD implantation, morbid obesity, HTN, CAD, DM, morbid obesity, and PAF on chronic coumadin. She is chronically oxygen dependent (2L) and desaturates at rest without oxygen. The patient presents to the emergency department today for worsening shortness of breath. Patient reports that she awoke from sleep this morning feeling as though she had some nasal congestion. This persisted throughout the day with some worsening shortness of breath. Patient used a nasal spray in the late afternoon which mildly improved her congestion. She states that she also took an extra dose of her fluid pill without relief. She has been voiding multiple times over the course of the day. Patient reports some mild soreness in her lower chest from deep breathing and persistent tachypnea. She denies any chest pain or syncope. No worsening leg swelling or reported fevers. She underwent cardioversion yesterday with Dr. Lovena Le.  PCP - Penni Homans Cardiologist - Dr. Lovena Le  Patient is a 74 y.o. female presenting with shortness of breath. The history is provided by the patient and the spouse. No language interpreter was used.  Shortness of Breath Associated symptoms: no chest pain, no fever and no vomiting     Past Medical History  Diagnosis Date  . LBBB (left bundle branch block)   . Carotid stenosis     a. 10/19/2011 carotid duplex - Mild hard plaque bilaterally. Stable 40-59% bilateral ICA stenosis. Carotid US (04/2013):  Bilateral 40-59% ICA.  F/u 1 year  . Dyslipidemia   .  Hypertension   . Chronic combined systolic and diastolic CHF (congestive heart failure)     a. EF as low as 25% in 2006;  b. EF 60-65% in 12/2011;  c. 02/2013 Echo: EF 35-40, mod mid-dist antsept HK, Gr 2 DD, mild LVH.  . Back pain, chronic     "just when I walk; mass on 3rd and 4th vertebrae right lower back"  . Cardiomyopathy, ischemic     a. 2012 s/p SJM 3231-40 Uni BiV ICD, ser # [id number].  Marland Kitchen CAD (coronary artery disease)     a. 05/2001 CABG x4: LIMA->LAD, VG->D1, VG->D2, VG->RCA.  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 CKD (chronic kidney disease), stage III   . Biventricular implantable cardiac defibrillator in situ 2007, 2012    a. 2007;  b. 2012 Gen change: SJM 3231-40 Uni BiV ICD, ser # [id number].  Marland Kitchen Hypoxemia requiring supplemental oxygen   . Candidiasis of vulva and vagina   . Hypothyroidism   . 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 Benay Spice, in remission  . History of pneumonia 12/27/11    "3 times; it's been a long time ago"  . OSA on CPAP   . Gout ~ 08/2011  . PAF (paroxysmal atrial fibrillation)     on Coumadin  . DM (diabetes mellitus), type 2 with complications     insulin dependent, retinopathy, neuropathy  . Mural thrombus of left ventricle     before 2003, while on Coumadin, No h/o CVA  . Morbid obesity with BMI of  45.0-49.9, adult     Ht. 5'4". BMI 47.2  . Vitamin D deficiency 06/09/2012    historical  . Olecranon bursitis of right elbow 12/29/2012  . History of recurrent UTIs   . Diabetes 05/24/2013  . Hypercalcemia 09/26/2013  . Anemia, unspecified 12/13/2013  . Renal insufficiency 03/14/2014    Following with [facility], Dr Loyal Buba  . Medicare annual wellness visit, subsequent 09/12/2014    Medication: Reviewed with patient and updated  Preferred Pharmacy and which med where: 90 day supply/mail order: [place] pharmacy: [place]  DM supplies: [facility]  [facility]   Allergies verified: UTD   Immunization Status: Flu vaccine-- 01/12/14 Tdap-- 01/28/12 PNA-- 03/11/13 (23)  Shingles--08/19/07  MMG-- 06/25/08 with Johnnette Gourd, MD at [facility]   Past Surgical History  Procedure Laterality Date  . Cardiac catheterization  06/03/01  . Cholecystectomy  1970  . Tubal ligation  1972  . Abdominal hysterectomy  1982  . Insert / replace / remove pacemaker  2007; 2012    w/AICD  . Coronary artery bypass graft  2003    CABG X4  . Cataract extraction      left eye  . Cardioversion N/A 07/30/2014    Procedure: CARDIOVERSION;  Surgeon: Jolaine Artist, MD;  Location: [facility] ENDOSCOPY;  Service: Cardiovascular;  Laterality: N/A;  . Refractive surgery      right eye  . Cardioversion N/A 11/25/2014    Procedure: CARDIOVERSION;  Surgeon: Evans Lance, MD;  Location: [facility] ENDOSCOPY;  Service: Cardiovascular;  Laterality: N/A;   Family History  Problem Relation Age of Onset  . Kidney cancer Mother     kidney and female repo - died @ 25  . Asthma Mother   . Cancer Mother     gyn and renal  . Heart disease Mother     CAD  . Heart disease Father     died @ 49  . Stroke Father   . Diabetes Father   . Hypertension Father   . Hyperlipidemia Father   . Hypertension Son   . Hypertension Maternal Aunt   . Kidney cancer Maternal Uncle   . Cancer Maternal Uncle   . Cirrhosis Maternal Grandmother     non alcohol  . Cancer Maternal Grandfather   . Kidney cancer Maternal Grandfather    History  Substance Use Topics  . Smoking status: Never Smoker   . Smokeless tobacco: Never Used  . Alcohol Use: No   OB History    No data available      Review of Systems  Constitutional: Negative for fever.  HENT: Positive for congestion.   Respiratory: Positive for chest tightness and shortness of breath.   Cardiovascular: Negative for chest pain and leg swelling.  Gastrointestinal: Negative for vomiting.  All other systems reviewed and are  negative.   Allergies  Sulfonamide derivatives  Home Medications   Prior to Admission medications   Medication Sig Start Date End Date Taking? Authorizing Provider  acetaminophen (TYLENOL) 500 MG tablet Take 1,000 mg by mouth every 6 (six) hours as needed (pain).    Historical Provider, MD  albuterol (PROVENTIL HFA;VENTOLIN HFA) 108 (90 BASE) MCG/ACT inhaler Inhale 2 puffs into the lungs every 6 (six) hours as needed for wheezing or shortness of breath. 12/26/12   Mosie Lukes, MD  amiodarone (PACERONE) 200 MG tablet Take 1 tablet (200 mg total) by mouth 2 (two) times daily. 10/14/14   Evans Lance, MD  atorvastatin (LIPITOR) 20 MG tablet Take 1 tablet (20 mg total) by mouth daily. 08/31/14   Mosie Lukes, MD  carvedilol (COREG) 25 MG tablet Take 2 tablets by mouth 2 (two) times daily.  07/27/14   Historical Provider, MD  escitalopram (LEXAPRO) 10 MG tablet Take 1 tablet (10 mg total) by mouth daily. 08/31/14   Mosie Lukes, MD  fenofibrate (TRICOR) 48 MG tablet Take 1 tablet (48 mg total) by mouth daily. 07/15/14   Larey Dresser, MD  fluticasone (FLONASE) 50 MCG/ACT nasal spray Place 2 sprays into both nostrils daily as needed for allergies or rhinitis. As needed for nasal stuffiness. 01/13/14   Mosie Lukes, MD  gabapentin (NEURONTIN) 300 MG capsule Take 1 capsule (300 mg total) by mouth 2 (two) times daily. 08/31/14   Mosie Lukes, MD  glucose blood (FREESTYLE LITE) test strip DX: 250.60  Check sugars bid and as needed 05/22/13   Mosie Lukes, MD  insulin NPH (HUMULIN N,NOVOLIN N) 100 UNIT/ML injection Inject 20-45 Units into the skin 2 (two) times daily before a meal. Take 45 units in the morning and 15 units in the evening    Historical Provider, MD  insulin regular (NOVOLIN R,HUMULIN R) 100 units/mL injection Change to 15 units three times a with meals (to cover all meals--this is the same total amount but spread out) Patient taking differently: Inject 15 Units into the skin 2 (two)  times daily before a meal. Change to 15 units three times a with meals (to cover all meals--this is the same total amount but spread out) 04/01/13   Ivan Anchors Love, PA-C  levothyroxine (SYNTHROID, LEVOTHROID) 25 MCG tablet Take 25 mcg by mouth daily before breakfast.    Historical Provider, MD  LORazepam (ATIVAN) 0.5 MG tablet Take 1 tablet (0.5 mg total) by mouth at bedtime as needed for anxiety. 08/31/14   Mosie Lukes, MD  NON FORMULARY Oxygen---24 hour    Historical Provider, MD  torsemide (DEMADEX) 20 MG tablet Take 40mg  in morning and 20mg  in evening. May take an extra tab for 3# weight gain, swelling or SOB 10/29/14   Bonnielee Haff, MD  Vitamin D, Ergocalciferol, (DRISDOL) 50000 UNITS CAPS capsule Take 50,000 Units by mouth every 7 (seven) days. Tuesdays    Historical Provider, MD  warfarin (COUMADIN) 5 MG tablet Take 1 tablet (5 mg total) by mouth daily at 6 PM. 10/29/14   Bonnielee Haff, MD   BP 140/65 mmHg  Pulse 73  Temp(Src) 99.4 F (37.4 C) (Oral)  Resp 16  Ht 5\' 4"  (1.626 m)  Wt 259 lb (117.482 kg)  BMI 44.44 kg/m2  SpO2 86%   Physical Exam  Constitutional: She is oriented to person, place, and time. She appears well-developed and well-nourished. No distress.  Nontoxic appearing, morbidly obese female  HENT:  Head: Normocephalic and atraumatic.  Eyes: Conjunctivae and EOM are normal. No scleral icterus.  Neck: Normal range of motion.  Cardiovascular: Normal rate, regular rhythm and intact distal pulses.   Pulmonary/Chest: Effort normal. Tachypnea noted. No respiratory distress. She has decreased breath sounds (diffuse; worse in b/l bases). She has no wheezes. She has no rales.  Patient speaking in truncated sentences. SpO2 86-89% on 4L O2 via [place].  Musculoskeletal: Normal range of motion.  Neurological: She is alert and oriented to person, place, and time. She exhibits normal muscle tone. Coordination normal.  GCS 15. Speech is goal oriented. Patient moves extremities  without ataxia.  Skin:  Skin is warm and dry. No rash noted. She is not diaphoretic. No erythema. No pallor.  Psychiatric: She has a normal mood and affect. Her behavior is normal.  Nursing note and vitals reviewed.   ED Course  Procedures (including critical care time) Labs Review Labs Reviewed  BASIC METABOLIC PANEL - Abnormal; Notable for the following:    Chloride 97 (*)    Glucose, Bld 211 (*)    BUN 58 (*)    Creatinine, Ser 2.86 (*)    Calcium 8.8 (*)    GFR calc non Af Amer 15 (*)    GFR calc Af Amer 18 (*)    All other components within normal limits  CBC - Abnormal; Notable for the following:    RBC 3.73 (*)    Hemoglobin 10.7 (*)    HCT 34.2 (*)    All other components within normal limits  BRAIN NATRIURETIC PEPTIDE - Abnormal; Notable for the following:    B Natriuretic Peptide 298.3 (*)    All other components within normal limits  PROTIME-INR - Abnormal; Notable for the following:    Prothrombin Time 25.5 (*)    INR 2.36 (*)    All other components within normal limits  I-STAT VENOUS BLOOD GAS, ED - Abnormal; Notable for the following:    pH, Ven 7.373 (*)    pCO2, Ven 53.1 (*)    pO2, Ven 27.0 (*)    Bicarbonate 30.9 (*)    Acid-Base Excess 5.0 (*)    All other components within normal limits  Randolm Idol, ED    Imaging Review Dg Chest Port 1 View  11/26/2014   CLINICAL DATA:  75 year old female with shortness of breath  EXAM: PORTABLE CHEST - 1 VIEW  COMPARISON:  Radiograph dated 10/27/2014  FINDINGS: The lungs are hypovolemic. There is diffuse band lateral interstitial and vascular prominence compatible with congestive changes. Cardiomegaly. Left pectoral AICD device and median sternotomy wires noted.  IMPRESSION: Cardiomegaly with increased vascular and interstitial prominence compatible with congestive changes. Clinical correlation and follow-up recommended.   Electronically Signed   By: Anner Crete M.D.   On: 11/26/2014 23:25     EKG  Interpretation   Date/Time:  Friday November 26 2014 22:24:28 EDT Ventricular Rate:  71 PR Interval:  142 QRS Duration: 160 QT Interval:  462 QTC Calculation: 502 R Axis:   -121 Text Interpretation:  AV dual-paced rhythm Abnormal ECG No significant  change since last tracing Confirmed by Glynn Octave [phone]) on  11/26/2014 10:55:48 PM      CRITICAL CARE Performed by: Antonietta Breach   Total critical care time: 31  Critical care time was exclusive of separately billable procedures and treating other patients.  Critical care was necessary to treat or prevent imminent or life-threatening deterioration.  Critical care was time spent personally by me on the following activities: development of treatment plan with patient and/or surrogate as well as nursing, discussions with consultants, evaluation of patient's response to treatment, examination of patient, obtaining history from patient or surrogate, ordering and performing treatments and interventions, ordering and review of laboratory studies, ordering and review of radiographic studies, pulse oximetry and re-evaluation of patient's condition.  MDM   Final diagnoses:  SOB (shortness of breath)  Acute congestive heart failure, unspecified congestive heart failure type    Patient to be admitted to [facility] for acute CHF exacerbation. Case discussed with Dr. Alcario Drought. BiPAP initiated in ED for persistent hypoxia; sats 86-91% on 4L nasal cannula.  Case also discussed with Dr. Susy Manor of cardiology who will see the patient in consult when she gets to the [facility] unit.   Filed Vitals:   11/26/14 2345 11/27/14 0000 11/27/14 0022 11/27/14 0030  BP: 122/95 136/47 124/43 131/56  Pulse: 74 69 72 70  Temp:      TempSrc:      Resp: 23 22 18 20   Height:      Weight:      SpO2: 93% 93% 93% 93%       Antonietta Breach, PA-C 11/27/14 0037  Everlene Balls, MD 11/27/14 [phone]

## 2014-11-26 NOTE — ED Notes (Signed)
PA at bedside.

## 2014-11-26 NOTE — ED Notes (Signed)
Pt. reports progressing SOB onset today worse with exertion , denies cough , no fever or chills.

## 2014-11-26 NOTE — ED Notes (Signed)
Dr Claudine Mouton in room

## 2014-11-27 ENCOUNTER — Encounter (HOSPITAL_COMMUNITY): Payer: Self-pay | Admitting: Internal Medicine

## 2014-11-27 DIAGNOSIS — I5043 Acute on chronic combined systolic (congestive) and diastolic (congestive) heart failure: Secondary | ICD-10-CM | POA: Diagnosis not present

## 2014-11-27 DIAGNOSIS — E785 Hyperlipidemia, unspecified: Secondary | ICD-10-CM | POA: Diagnosis present

## 2014-11-27 DIAGNOSIS — I517 Cardiomegaly: Secondary | ICD-10-CM | POA: Diagnosis present

## 2014-11-27 DIAGNOSIS — J9621 Acute and chronic respiratory failure with hypoxia: Secondary | ICD-10-CM

## 2014-11-27 DIAGNOSIS — G8929 Other chronic pain: Secondary | ICD-10-CM | POA: Diagnosis present

## 2014-11-27 DIAGNOSIS — E038 Other specified hypothyroidism: Secondary | ICD-10-CM | POA: Diagnosis not present

## 2014-11-27 DIAGNOSIS — R0602 Shortness of breath: Secondary | ICD-10-CM | POA: Diagnosis not present

## 2014-11-27 DIAGNOSIS — E1122 Type 2 diabetes mellitus with diabetic chronic kidney disease: Secondary | ICD-10-CM | POA: Diagnosis present

## 2014-11-27 DIAGNOSIS — I480 Paroxysmal atrial fibrillation: Secondary | ICD-10-CM

## 2014-11-27 DIAGNOSIS — I509 Heart failure, unspecified: Secondary | ICD-10-CM

## 2014-11-27 DIAGNOSIS — I481 Persistent atrial fibrillation: Secondary | ICD-10-CM | POA: Diagnosis present

## 2014-11-27 DIAGNOSIS — N058 Unspecified nephritic syndrome with other morphologic changes: Secondary | ICD-10-CM

## 2014-11-27 DIAGNOSIS — I129 Hypertensive chronic kidney disease with stage 1 through stage 4 chronic kidney disease, or unspecified chronic kidney disease: Secondary | ICD-10-CM | POA: Diagnosis present

## 2014-11-27 DIAGNOSIS — Z9981 Dependence on supplemental oxygen: Secondary | ICD-10-CM | POA: Diagnosis not present

## 2014-11-27 DIAGNOSIS — Z9842 Cataract extraction status, left eye: Secondary | ICD-10-CM | POA: Diagnosis not present

## 2014-11-27 DIAGNOSIS — Z882 Allergy status to sulfonamides status: Secondary | ICD-10-CM | POA: Diagnosis not present

## 2014-11-27 DIAGNOSIS — E039 Hypothyroidism, unspecified: Secondary | ICD-10-CM | POA: Diagnosis present

## 2014-11-27 DIAGNOSIS — Z951 Presence of aortocoronary bypass graft: Secondary | ICD-10-CM | POA: Diagnosis not present

## 2014-11-27 DIAGNOSIS — G4733 Obstructive sleep apnea (adult) (pediatric): Secondary | ICD-10-CM | POA: Diagnosis present

## 2014-11-27 DIAGNOSIS — J449 Chronic obstructive pulmonary disease, unspecified: Secondary | ICD-10-CM | POA: Diagnosis present

## 2014-11-27 DIAGNOSIS — I2510 Atherosclerotic heart disease of native coronary artery without angina pectoris: Secondary | ICD-10-CM | POA: Diagnosis present

## 2014-11-27 DIAGNOSIS — Z90710 Acquired absence of both cervix and uterus: Secondary | ICD-10-CM | POA: Diagnosis not present

## 2014-11-27 DIAGNOSIS — I5023 Acute on chronic systolic (congestive) heart failure: Secondary | ICD-10-CM | POA: Diagnosis not present

## 2014-11-27 DIAGNOSIS — E1129 Type 2 diabetes mellitus with other diabetic kidney complication: Secondary | ICD-10-CM

## 2014-11-27 DIAGNOSIS — M109 Gout, unspecified: Secondary | ICD-10-CM | POA: Diagnosis present

## 2014-11-27 DIAGNOSIS — E11319 Type 2 diabetes mellitus with unspecified diabetic retinopathy without macular edema: Secondary | ICD-10-CM | POA: Diagnosis present

## 2014-11-27 DIAGNOSIS — Z95810 Presence of automatic (implantable) cardiac defibrillator: Secondary | ICD-10-CM | POA: Diagnosis not present

## 2014-11-27 DIAGNOSIS — Z794 Long term (current) use of insulin: Secondary | ICD-10-CM | POA: Diagnosis not present

## 2014-11-27 DIAGNOSIS — I255 Ischemic cardiomyopathy: Secondary | ICD-10-CM | POA: Diagnosis present

## 2014-11-27 DIAGNOSIS — Z8572 Personal history of non-Hodgkin lymphomas: Secondary | ICD-10-CM | POA: Diagnosis not present

## 2014-11-27 DIAGNOSIS — Z6841 Body Mass Index (BMI) 40.0 and over, adult: Secondary | ICD-10-CM | POA: Diagnosis not present

## 2014-11-27 DIAGNOSIS — I447 Left bundle-branch block, unspecified: Secondary | ICD-10-CM | POA: Diagnosis present

## 2014-11-27 DIAGNOSIS — N183 Chronic kidney disease, stage 3 (moderate): Secondary | ICD-10-CM | POA: Diagnosis present

## 2014-11-27 LAB — GLUCOSE, CAPILLARY
Glucose-Capillary: 131 mg/dL — ABNORMAL HIGH (ref 65–99)
Glucose-Capillary: 100 mg/dL — ABNORMAL HIGH (ref 65–99)
Glucose-Capillary: 118 mg/dL — ABNORMAL HIGH (ref 65–99)
Glucose-Capillary: 120 mg/dL — ABNORMAL HIGH (ref 65–99)

## 2014-11-27 LAB — PROTIME-INR
INR: 2.51 — ABNORMAL HIGH (ref 0.00–1.49)
Prothrombin Time: 26.7 seconds — ABNORMAL HIGH (ref 11.6–15.2)

## 2014-11-27 LAB — MRSA PCR SCREENING: MRSA by PCR: NEGATIVE

## 2014-11-27 MED ORDER — CETYLPYRIDINIUM CHLORIDE 0.05 % MT LIQD
7.0000 mL | Freq: Two times a day (BID) | OROMUCOSAL | Status: DC
  Administered 2014-11-27 – 2014-11-28 (×3): 7 mL via OROMUCOSAL

## 2014-11-27 MED ORDER — SODIUM CHLORIDE 0.9 % IJ SOLN: 3.0000 mL | INTRAMUSCULAR | Status: DC | PRN

## 2014-11-27 MED ORDER — FENOFIBRATE 54 MG PO TABS
54.0000 mg | ORAL_TABLET | Freq: Every day | ORAL | Status: DC
Start: 2014-11-27 — End: 2014-11-28
  Administered 2014-11-27 – 2014-11-28 (×2): 54 mg via ORAL
  Filled 2014-11-27 (×2): qty 1

## 2014-11-27 MED ORDER — SALINE SPRAY 0.65 % NA SOLN
1.0000 | NASAL | Status: DC | PRN
  Administered 2014-11-27 (×2): 1 via NASAL
  Filled 2014-11-27: qty 44

## 2014-11-27 MED ORDER — TORSEMIDE 20 MG PO TABS
60.0000 mg | ORAL_TABLET | Freq: Two times a day (BID) | ORAL | Status: DC
  Administered 2014-11-27 – 2014-11-28 (×3): 60 mg via ORAL
  Filled 2014-11-27 (×5): qty 3

## 2014-11-27 MED ORDER — ALBUTEROL SULFATE (2.5 MG/3ML) 0.083% IN NEBU: 3.0000 mL | INHALATION_SOLUTION | Freq: Four times a day (QID) | RESPIRATORY_TRACT | Status: DC | PRN

## 2014-11-27 MED ORDER — ACETAMINOPHEN 325 MG PO TABS: 650.0000 mg | ORAL_TABLET | ORAL | Status: DC | PRN

## 2014-11-27 MED ORDER — ATORVASTATIN CALCIUM 20 MG PO TABS
20.0000 mg | ORAL_TABLET | Freq: Every day | ORAL | Status: DC
  Administered 2014-11-27 – 2014-11-28 (×2): 20 mg via ORAL
  Filled 2014-11-27 (×2): qty 1

## 2014-11-27 MED ORDER — ONDANSETRON HCL 4 MG/2ML IJ SOLN: 4.0000 mg | Freq: Four times a day (QID) | INTRAMUSCULAR | Status: DC | PRN

## 2014-11-27 MED ORDER — INSULIN NPH (HUMAN) (ISOPHANE) 100 UNIT/ML ~~LOC~~ SUSP
45.0000 [IU] | Freq: Every day | SUBCUTANEOUS | Status: DC
  Administered 2014-11-27 – 2014-11-28 (×2): 45 [IU] via SUBCUTANEOUS
  Filled 2014-11-27 (×2): qty 10

## 2014-11-27 MED ORDER — LORAZEPAM 0.5 MG PO TABS
0.5000 mg | ORAL_TABLET | Freq: Every evening | ORAL | Status: DC | PRN
  Administered 2014-11-27: 0.5 mg via ORAL
  Filled 2014-11-27: qty 1

## 2014-11-27 MED ORDER — INSULIN ASPART 100 UNIT/ML ~~LOC~~ SOLN
0.0000 [IU] | Freq: Three times a day (TID) | SUBCUTANEOUS | Status: DC
  Administered 2014-11-28: 3 [IU] via SUBCUTANEOUS

## 2014-11-27 MED ORDER — SODIUM CHLORIDE 0.9 % IJ SOLN
3.0000 mL | Freq: Two times a day (BID) | INTRAMUSCULAR | Status: DC
  Administered 2014-11-27 – 2014-11-28 (×3): 3 mL via INTRAVENOUS

## 2014-11-27 MED ORDER — AMIODARONE HCL 200 MG PO TABS
200.0000 mg | ORAL_TABLET | Freq: Two times a day (BID) | ORAL | Status: DC
  Administered 2014-11-27 – 2014-11-28 (×3): 200 mg via ORAL
  Filled 2014-11-27 (×5): qty 1

## 2014-11-27 MED ORDER — ACETAMINOPHEN 500 MG PO TABS: 1000.0000 mg | ORAL_TABLET | Freq: Four times a day (QID) | ORAL | Status: DC | PRN

## 2014-11-27 MED ORDER — LEVOTHYROXINE SODIUM 25 MCG PO TABS
25.0000 ug | ORAL_TABLET | Freq: Every day | ORAL | Status: DC
  Administered 2014-11-27 – 2014-11-28 (×2): 25 ug via ORAL
  Filled 2014-11-27 (×3): qty 1

## 2014-11-27 MED ORDER — INSULIN NPH (HUMAN) (ISOPHANE) 100 UNIT/ML ~~LOC~~ SUSP
15.0000 [IU] | Freq: Every day | SUBCUTANEOUS | Status: DC
  Administered 2014-11-27: 15 [IU] via SUBCUTANEOUS

## 2014-11-27 MED ORDER — WARFARIN - PHARMACIST DOSING INPATIENT: Freq: Every day | Status: DC

## 2014-11-27 MED ORDER — CARVEDILOL 25 MG PO TABS
50.0000 mg | ORAL_TABLET | Freq: Two times a day (BID) | ORAL | Status: DC
  Administered 2014-11-27 – 2014-11-28 (×3): 50 mg via ORAL
  Filled 2014-11-27 (×5): qty 2

## 2014-11-27 MED ORDER — WARFARIN SODIUM 5 MG PO TABS
5.0000 mg | ORAL_TABLET | Freq: Every day | ORAL | Status: DC
  Administered 2014-11-27: 5 mg via ORAL
  Filled 2014-11-27 (×2): qty 1

## 2014-11-27 MED ORDER — ESCITALOPRAM OXALATE 10 MG PO TABS
10.0000 mg | ORAL_TABLET | Freq: Every day | ORAL | Status: DC
  Administered 2014-11-27 – 2014-11-28 (×2): 10 mg via ORAL
  Filled 2014-11-27 (×2): qty 1

## 2014-11-27 MED ORDER — FLUTICASONE PROPIONATE 50 MCG/ACT NA SUSP: 2.0000 | Freq: Every day | NASAL | Status: DC | PRN

## 2014-11-27 MED ORDER — INSULIN ASPART 100 UNIT/ML ~~LOC~~ SOLN
6.0000 [IU] | Freq: Three times a day (TID) | SUBCUTANEOUS | Status: DC
  Administered 2014-11-27 – 2014-11-28 (×4): 6 [IU] via SUBCUTANEOUS

## 2014-11-27 MED ORDER — GABAPENTIN 300 MG PO CAPS
300.0000 mg | ORAL_CAPSULE | Freq: Two times a day (BID) | ORAL | Status: DC
  Administered 2014-11-27 – 2014-11-28 (×3): 300 mg via ORAL
  Filled 2014-11-27 (×5): qty 1

## 2014-11-27 MED ORDER — SODIUM CHLORIDE 0.9 % IV SOLN: 250.0000 mL | INTRAVENOUS | Status: DC | PRN

## 2014-11-27 MED ORDER — ALBUTEROL SULFATE HFA 108 (90 BASE) MCG/ACT IN AERS: 2.0000 | INHALATION_SPRAY | Freq: Four times a day (QID) | RESPIRATORY_TRACT | Status: DC | PRN

## 2014-11-27 NOTE — Consult Note (Signed)
Referring Physician: Dr. Alcario Drought   Primary Cardiologist: Dr. Aundra Dubin (HF), Dr. Lovena Le (EP)  Reason for Consultation: HF exacerbation  HPI: 74 y.o. Obese CA female with CAD, s/p CABG in 2003, ischemic cardiomyopathy, severe LV systolic dysfunction, chronic systolic HF, LVEF 95-63% by a recent echo on 10/20/14, s/p [employer] CRTD implanted in 2007 with generator change out in 2012, CKD-III, paroxysmal AF, on coumadin and Amiodarone, home O2 2-3 liters, recently underwent DCCV by Dr. Lovena Le on 11/25/14, presented to ER for evaluation of SOB.  She reports that she did not take her torsemide on the day of cardioversion and next morning her weight was up by 2 lbs so she took extra 20 mg of Torsemide from her usual dose of 40 in am and 20 in pm. She reports good urine output with that intervention, She also reports that her nose is stuffed up and late yesterday she felt more SOB so came to ER. Denies angina, syncope, bleeding, fever, chills, cough. IN ER, she was given Lasix 60 mg IV with good UOP and improved SOB.    Review of Systems:  10 systems reviewed unremarkable except as noted in HPI   Past Medical History  Diagnosis Date  . LBBB (left bundle branch block)   . Carotid stenosis     a. 10/19/2011 carotid duplex - Mild hard plaque bilaterally. Stable 40-59% bilateral ICA stenosis. Carotid US (04/2013):  Bilateral 40-59% ICA.  F/u 1 year  . Dyslipidemia   . Hypertension   . Chronic combined systolic and diastolic CHF (congestive heart failure)     a. EF as low as 25% in 2006;  b. EF 60-65% in 12/2011;  c. 02/2013 Echo: EF 35-40, mod mid-dist antsept HK, Gr 2 DD, mild LVH.  . Back pain, chronic     "just when I walk; mass on 3rd and 4th vertebrae right lower back"  . Cardiomyopathy, ischemic     a. 2012 s/p SJM 3231-40 Uni BiV ICD, ser # [id number].  Marland Kitchen CAD (coronary artery disease)     a. 05/2001 CABG x4: LIMA->LAD, VG->D1, VG->D2, VG->RCA.  Marland Kitchen Retinopathy due to secondary diabetes    type II, uncontrolled  . CKD (chronic kidney disease), stage III   . Biventricular implantable cardiac defibrillator in situ 2007, 2012    a. 2007;  b. 2012 Gen change: SJM 3231-40 Uni BiV ICD, ser # [id number].  Marland Kitchen Hypoxemia requiring supplemental oxygen   . Candidiasis of vulva and vagina   . Hypothyroidism   . 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 Benay Spice, in remission  . History of pneumonia 12/27/11    "3 times; it's been a long time ago"  . OSA on CPAP   . Gout ~ 08/2011  . PAF (paroxysmal atrial fibrillation)     on Coumadin  . DM (diabetes mellitus), type 2 with complications     insulin dependent, retinopathy, neuropathy  . Mural thrombus of left ventricle     before 2003, while on Coumadin, No h/o CVA  . Morbid obesity with BMI of 45.0-49.9, adult     Ht. 5'4". BMI 47.2  . Vitamin D deficiency 06/09/2012    historical  . Olecranon bursitis of right elbow 12/29/2012  . History of recurrent UTIs   . Diabetes 05/24/2013  . Hypercalcemia 09/26/2013  . Anemia, unspecified 12/13/2013  . Renal insufficiency 03/14/2014    Following with [facility], Dr Loyal Buba  . Medicare annual  wellness visit, subsequent 09/12/2014    Medication: Reviewed with patient and updated  Preferred Pharmacy and which med where: 90 day supply/mail order: [place] [facility]: [place]  DM supplies: [facility]   Allergies verified: UTD   Immunization Status: Flu vaccine-- 01/12/14 Tdap-- 01/28/12 PNA-- 03/11/13 (23)  Shingles--08/19/07  MMG-- 06/25/08 with Johnnette Gourd, MD at [facility]    Medications Prior to Admission  Medication Sig Dispense Refill  . acetaminophen (TYLENOL) 500 MG tablet Take 1,000 mg by mouth every 6 (six) hours as needed (pain).    Marland Kitchen albuterol (PROVENTIL HFA;VENTOLIN HFA) 108 (90 BASE) MCG/ACT inhaler Inhale 2 puffs into the lungs every 6 (six) hours as needed for wheezing or shortness of breath. 1  Inhaler 1  . amiodarone (PACERONE) 200 MG tablet Take 1 tablet (200 mg total) by mouth 2 (two) times daily. 180 tablet 1  . atorvastatin (LIPITOR) 20 MG tablet Take 1 tablet (20 mg total) by mouth daily. 90 tablet 1  . carvedilol (COREG) 25 MG tablet Take 2 tablets by mouth 2 (two) times daily.     Marland Kitchen escitalopram (LEXAPRO) 10 MG tablet Take 1 tablet (10 mg total) by mouth daily. 90 tablet 1  . fenofibrate (TRICOR) 48 MG tablet Take 1 tablet (48 mg total) by mouth daily. 90 tablet 3  . fluticasone (FLONASE) 50 MCG/ACT nasal spray Place 2 sprays into both nostrils daily as needed for allergies or rhinitis. As needed for nasal stuffiness. 16 g 0  . gabapentin (NEURONTIN) 300 MG capsule Take 1 capsule (300 mg total) by mouth 2 (two) times daily. 180 capsule 1  . insulin NPH (HUMULIN N,NOVOLIN N) 100 UNIT/ML injection Inject 20-45 Units into the skin 2 (two) times daily before a meal. Take 45 units in the morning and 15 units in the evening    . insulin regular (NOVOLIN R,HUMULIN R) 100 units/mL injection Change to 15 units three times a with meals (to cover all meals--this is the same total amount but spread out) (Patient taking differently: Inject 15 Units into the skin 2 (two) times daily before a meal. Change to 15 units three times a with meals (to cover all meals--this is the same total amount but spread out)) 10 mL 12  . levothyroxine (SYNTHROID, LEVOTHROID) 25 MCG tablet Take 25 mcg by mouth daily before breakfast.    . LORazepam (ATIVAN) 0.5 MG tablet Take 1 tablet (0.5 mg total) by mouth at bedtime as needed for anxiety. 90 tablet 1  . torsemide (DEMADEX) 20 MG tablet Take 40mg  in morning and 20mg  in evening. May take an extra tab for 3# weight gain, swelling or SOB 135 tablet 3  . Vitamin D, Ergocalciferol, (DRISDOL) 50000 UNITS CAPS capsule Take 50,000 Units by mouth every 7 (seven) days. Tuesdays    . warfarin (COUMADIN) 5 MG tablet Take 1 tablet (5 mg total) by mouth daily at 6 PM.    .  glucose blood (FREESTYLE LITE) test strip DX: 250.60  Check sugars bid and as needed 100 each 2  . NON FORMULARY Oxygen---24 hour       Inpatient:   . amiodarone  200 mg Oral BID  . antiseptic oral rinse  7 mL Mouth Rinse BID  . atorvastatin  20 mg Oral Daily  . carvedilol  50 mg Oral BID WC  . escitalopram  10 mg Oral Daily  . fenofibrate  54 mg Oral Daily  . gabapentin  300 mg Oral BID  .  insulin aspart  0-15 Units Subcutaneous TID WC  . insulin aspart  6 Units Subcutaneous TID WC  . insulin NPH Human  15 Units Subcutaneous Q supper  . insulin NPH Human  45 Units Subcutaneous Q breakfast  . levothyroxine  25 mcg Oral QAC breakfast  . sodium chloride  3 mL Intravenous Q12H  . warfarin  5 mg Oral q1800  . Warfarin - Pharmacist Dosing Inpatient   Does not apply q1800    Infusions:    Allergies  Allergen Reactions  . Sulfonamide Derivatives Other (See Comments)    Unknown; "childhood allergy/mother"    History   Social History  . Marital Status: Married    Spouse Name: N/A  . Number of Children: Y  . Years of Education: N/A   Occupational History  . 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    Social History Main Topics  . Smoking status: Never Smoker   . Smokeless tobacco: Never Used  . Alcohol Use: No  . Drug Use: No  . Sexual Activity: Not on file     Comment: lives with husband, no major dietary restrictions   Other Topics Concern  . Not on file   Social History Narrative   Lives in [place] with her husband.  She does not routinely exercise or adhere to any particular diet.      Family History  Problem Relation Age of Onset  . Kidney cancer Mother     kidney and female repo - died @ 61  . Asthma Mother   . Cancer Mother     gyn and renal  . Heart disease Mother     CAD  . Heart disease Father     died @ 80  . Stroke Father   . Diabetes Father   . Hypertension Father   . Hyperlipidemia Father   . Hypertension Son   . Hypertension Maternal  Aunt   . Kidney cancer Maternal Uncle   . Cancer Maternal Uncle   . Cirrhosis Maternal Grandmother     non alcohol  . Cancer Maternal Grandfather   . Kidney cancer Maternal Grandfather     PHYSICAL EXAM: Filed Vitals:   11/27/14 0325  BP: 125/54  Pulse: 68  Temp: 99.6 F (37.6 C)  Resp: 18     Intake/Output Summary (Last 24 hours) at 11/27/14 0932 Last data filed at 11/27/14 0427  Gross per 24 hour  Intake    120 ml  Output    500 ml  Net   -380 ml    General:  Obese, Well appearing. No respiratory difficulty HEENT: normal Neck: supple. JVP difficult to assess. Carotids 2+ bilat; no bruits. No lymphadenopathy or thryomegaly appreciated. Cor: PMI displaced. Regular rate & rhythm. No rubs, gallops or murmurs. Lungs: clear Abdomen: soft, nontender, nondistended. No hepatosplenomegaly. No bruits or masses. Good bowel sounds. Extremities: no cyanosis, clubbing, rash, edema Neuro: alert & oriented x 3, cranial nerves grossly intact. moves all 4 extremities w/o difficulty. Affect pleasant.  ECG: BiV paced at 75 bpm   Results for orders placed or performed during the hospital encounter of 11/26/14 (from the past 24 hour(s))  Basic metabolic panel     Status: Abnormal   Collection Time: 11/26/14 10:34 PM  Result Value Ref Range   Sodium 137 135 - 145 mmol/L   Potassium 4.6 3.5 - 5.1 mmol/L   Chloride 97 (L) 101 - 111 mmol/L   CO2 30 22 -  32 mmol/L   Glucose, Bld 211 (H) 65 - 99 mg/dL   BUN 58 (H) 6 - 20 mg/dL   Creatinine, Ser 2.86 (H) 0.44 - 1.00 mg/dL   Calcium 8.8 (L) 8.9 - 10.3 mg/dL   GFR calc non Af Amer 15 (L) >60 mL/min   GFR calc Af Amer 18 (L) >60 mL/min   Anion gap 10 5 - 15  CBC     Status: Abnormal   Collection Time: 11/26/14 10:34 PM  Result Value Ref Range   WBC 9.9 4.0 - 10.5 K/uL   RBC 3.73 (L) 3.87 - 5.11 MIL/uL   Hemoglobin 10.7 (L) 12.0 - 15.0 g/dL   HCT 34.2 (L) 36.0 - 46.0 %   MCV 91.7 78.0 - 100.0 fL   MCH 28.7 26.0 - 34.0 pg   MCHC 31.3  30.0 - 36.0 g/dL   RDW 14.8 11.5 - 15.5 %   Platelets 172 150 - 400 K/uL  Protime-INR     Status: Abnormal   Collection Time: 11/26/14 10:34 PM  Result Value Ref Range   Prothrombin Time 25.5 (H) 11.6 - 15.2 seconds   INR 2.36 (H) 0.00 - 1.49  Brain natriuretic peptide     Status: Abnormal   Collection Time: 11/26/14 10:36 PM  Result Value Ref Range   B Natriuretic Peptide 298.3 (H) 0.0 - 100.0 pg/mL  I-stat troponin, ED     Status: None   Collection Time: 11/26/14 10:40 PM  Result Value Ref Range   Troponin i, poc 0.03 0.00 - 0.08 ng/mL   Comment 3          I-Stat Venous Blood Gas, ED (order at [facility] and [facility] only)     Status: Abnormal   Collection Time: 11/26/14 11:22 PM  Result Value Ref Range   pH, Ven 7.373 (H) 7.250 - 7.300   pCO2, Ven 53.1 (H) 45.0 - 50.0 mmHg   pO2, Ven 27.0 (LL) 30.0 - 45.0 mmHg   Bicarbonate 30.9 (H) 20.0 - 24.0 mEq/L   TCO2 32 0 - 100 mmol/L   O2 Saturation 47.0 %   Acid-Base Excess 5.0 (H) 0.0 - 2.0 mmol/L   Sample type VENOUS    Comment NOTIFIED PHYSICIAN   MRSA PCR Screening     Status: None   Collection Time: 11/27/14 12:57 AM  Result Value Ref Range   MRSA by PCR NEGATIVE NEGATIVE  Glucose, capillary     Status: Abnormal   Collection Time: 11/27/14  1:13 AM  Result Value Ref Range   Glucose-Capillary 131 (H) 65 - 99 mg/dL   Comment 1 Document in Chart   Protime-INR     Status: Abnormal   Collection Time: 11/27/14  2:35 AM  Result Value Ref Range   Prothrombin Time 26.7 (H) 11.6 - 15.2 seconds   INR 2.51 (H) 0.00 - 1.49   *Note: Due to a large number of results and/or encounters for the requested time period, some results have not been displayed. A complete set of results can be found in Results Review.   Dg Chest Port 1 View  11/26/2014   CLINICAL DATA:  74 year old female with shortness of breath  EXAM: PORTABLE CHEST - 1 VIEW  COMPARISON:  Radiograph dated 10/27/2014  FINDINGS: The lungs are hypovolemic. There is diffuse band lateral  interstitial and vascular prominence compatible with congestive changes. Cardiomegaly. Left pectoral AICD device and median sternotomy wires noted.  IMPRESSION: Cardiomegaly with increased vascular and interstitial prominence compatible with congestive  changes. Clinical correlation and follow-up recommended.   Electronically Signed   By: Anner Crete M.D.   On: 11/26/2014 23:25     ASSESSMENT:  1. SOB likely related to  Mild acute on chronic systolic HF as well as nasal/sinus congestion - Stable BP; no shock  2. Paroxysmal AF 3. CRTD in place 4. CKD-III   PLAN/DISCUSSION:  - Would restart her Torsemide but give her 60 mg this am followed usual home dose. Agree with monitoring of renal function and electrolytes as well as weight and I/O. Continue oxygen. Saline nasal spray. Ambulate. Continue other home meds including coumadin. Anticipate further improvement in next 24-48 hours.   Wandra Mannan, MD Cardiology

## 2014-11-27 NOTE — Progress Notes (Addendum)
Pt seen and examined at bedside, admitted after midnight, please see earlier admission note by Dr. Alcario Drought. Pt admitted with shortness of breath that was determined to be secondary to acute on chronic systolic CHF. Cardiology team has seen in consultation and has recommended restarting home regimen with Torsemide but give one dose of Lasix IV 60 mg one time this am. Monitor strict I/O, daily weights (weight today is 117 kg ( (Body mass index is 44.28 kg/(m^2).)  Will check BMP in AM, continue Coumadin per pharmacy. Faye Ramsay, MD  [facility] Pager [phone]  If 7PM-7AM, please contact night-coverage [email or web address] Password [email or web address]

## 2014-11-27 NOTE — Progress Notes (Signed)
Utilization Review Completed.  

## 2014-11-27 NOTE — Progress Notes (Signed)
ANTICOAGULATION CONSULT NOTE - Initial Consult  Pharmacy Consult for Coumadin Indication: atrial fibrillation  Allergies  Allergen Reactions  . Sulfonamide Derivatives Other (See Comments)    Unknown; "childhood allergy/mother"    Patient Measurements: Height: 5\' 4"  (162.6 cm) Weight: 259 lb (117.482 kg) IBW/kg (Calculated) : 54.7  Vital Signs: Temp: 99.4 F (37.4 C) (07/15 2224) Temp Source: Oral (07/15 2224) BP: 131/56 mmHg (07/16 0030) Pulse Rate: 70 (07/16 0030)  Labs:  Recent Labs  11/26/14 2234  HGB 10.7*  HCT 34.2*  PLT 172  LABPROT 25.5*  INR 2.36*  CREATININE 2.86*    Estimated Creatinine Clearance: 22.4 mL/min (by C-G formula based on Cr of 2.86).   Medical History: Past Medical History  Diagnosis Date  . LBBB (left bundle branch block)   . Carotid stenosis     a. 10/19/2011 carotid duplex - Mild hard plaque bilaterally. Stable 40-59% bilateral ICA stenosis. Carotid US (04/2013):  Bilateral 40-59% ICA.  F/u 1 year  . Dyslipidemia   . Hypertension   . Chronic combined systolic and diastolic CHF (congestive heart failure)     a. EF as low as 25% in 2006;  b. EF 60-65% in 12/2011;  c. 02/2013 Echo: EF 35-40, mod mid-dist antsept HK, Gr 2 DD, mild LVH.  . Back pain, chronic     "just when I walk; mass on 3rd and 4th vertebrae right lower back"  . Cardiomyopathy, ischemic     a. 2012 s/p SJM 3231-40 Uni BiV ICD, ser # [id number].  Marland Kitchen CAD (coronary artery disease)     a. 05/2001 CABG x4: LIMA->LAD, VG->D1, VG->D2, VG->RCA.  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 CKD (chronic kidney disease), stage III   . Biventricular implantable cardiac defibrillator in situ 2007, 2012    a. 2007;  b. 2012 Gen change: SJM 3231-40 Uni BiV ICD, ser # [id number].  Marland Kitchen Hypoxemia requiring supplemental oxygen   . Candidiasis of vulva and vagina   . Hypothyroidism   . 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 Benay Spice,  in remission  . History of pneumonia 12/27/11    "3 times; it's been a long time ago"  . OSA on CPAP   . Gout ~ 08/2011  . PAF (paroxysmal atrial fibrillation)     on Coumadin  . DM (diabetes mellitus), type 2 with complications     insulin dependent, retinopathy, neuropathy  . Mural thrombus of left ventricle     before 2003, while on Coumadin, No h/o CVA  . Morbid obesity with BMI of 45.0-49.9, adult     Ht. 5'4". BMI 47.2  . Vitamin D deficiency 06/09/2012    historical  . Olecranon bursitis of right elbow 12/29/2012  . History of recurrent UTIs   . Diabetes 05/24/2013  . Hypercalcemia 09/26/2013  . Anemia, unspecified 12/13/2013  . Renal insufficiency 03/14/2014    Following with [facility], Dr Loyal Buba  . Medicare annual wellness visit, subsequent 09/12/2014    Medication: Reviewed with patient and updated  Preferred Pharmacy and which med where: 90 day supply/mail order: [place] [facility]: [place]  DM supplies: [facility]   Allergies verified: UTD   Immunization Status: Flu vaccine-- 01/12/14 Tdap-- 01/28/12 PNA-- 03/11/13 (23)  Shingles--08/19/07  MMG-- 06/25/08 with Johnnette Gourd, MD at [facility]    Medications:  See electronic med rec  Assessment: 74 y.o. female presents  with SOB. Pt on coumadin PTA for afib. Home dose 5mg  daily - last dose 7/15. Admit INR therapeutic. CBC stable.  Goal of Therapy:  INR 2-3 Monitor platelets by anticoagulation protocol: Yes   Plan:  Daily PT/INR Continue home coumadin dose of 5mg  daily  Sherlon Handing, PharmD, BCPS Clinical pharmacist, pager [phone] 11/27/2014,12:50 AM

## 2014-11-27 NOTE — Progress Notes (Signed)
Pt CBG upon admission to [facility] is 131

## 2014-11-27 NOTE — H&P (Signed)
[facility] History and Physical  CELINA SHILEY QQV:[id number] DOB: February 13, 1941 DOA: 11/26/2014  Referring physician: EDP PCP: Penni Homans, MD   Chief Complaint: SOB   HPI: Destiny [name] is a 74 y.o. female with extensive history of systolic CHF, EF 33-29%.  Has bivetricular pacer / AICD.  Was just admitted in June this year for CHF exacerbation.  After discharge had been doing well other than bump in creatinine at end of June to 3.49.  Had cardioversion yesterday for A.Fib, patient did not take her torsemide in the morning yesterday.  As a result she was SOB today and had gained over 2 LBS.  Took Torsemide at 8 PM tonight, and presented to ED.  In the ED she was found to be fluid overloaded on CXR.  Given IV lasix 60mg  which is now working.  SOB is improved and she is starting to have UOP.   Review of Systems: Systems reviewed.  As above, otherwise negative  Past Medical History  Diagnosis Date  . LBBB (left bundle branch block)   . Carotid stenosis     a. 10/19/2011 carotid duplex - Mild hard plaque bilaterally. Stable 40-59% bilateral ICA stenosis. Carotid US (04/2013):  Bilateral 40-59% ICA.  F/u 1 year  . Dyslipidemia   . Hypertension   . Chronic combined systolic and diastolic CHF (congestive heart failure)     a. EF as low as 25% in 2006;  b. EF 60-65% in 12/2011;  c. 02/2013 Echo: EF 35-40, mod mid-dist antsept HK, Gr 2 DD, mild LVH.  . Back pain, chronic     "just when I walk; mass on 3rd and 4th vertebrae right lower back"  . Cardiomyopathy, ischemic     a. 2012 s/p SJM 3231-40 Uni BiV ICD, ser # [id number].  Marland Kitchen CAD (coronary artery disease)     a. 05/2001 CABG x4: LIMA->LAD, VG->D1, VG->D2, VG->RCA.  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 CKD (chronic kidney disease), stage III   . Biventricular implantable cardiac defibrillator in situ 2007, 2012    a. 2007;  b. 2012 Gen change: SJM 3231-40 Uni BiV ICD, ser # [id number].  Marland Kitchen Hypoxemia  requiring supplemental oxygen   . Candidiasis of vulva and vagina   . Hypothyroidism   . 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 Benay Spice, in remission  . History of pneumonia 12/27/11    "3 times; it's been a long time ago"  . OSA on CPAP   . Gout ~ 08/2011  . PAF (paroxysmal atrial fibrillation)     on Coumadin  . DM (diabetes mellitus), type 2 with complications     insulin dependent, retinopathy, neuropathy  . Mural thrombus of left ventricle     before 2003, while on Coumadin, No h/o CVA  . Morbid obesity with BMI of 45.0-49.9, adult     Ht. 5'4". BMI 47.2  . Vitamin D deficiency 06/09/2012    historical  . Olecranon bursitis of right elbow 12/29/2012  . History of recurrent UTIs   . Diabetes 05/24/2013  . Hypercalcemia 09/26/2013  . Anemia, unspecified 12/13/2013  . Renal insufficiency 03/14/2014    Following with [facility], Dr Loyal Buba  . Medicare annual wellness visit, subsequent 09/12/2014    Medication: Reviewed with patient and updated  Preferred Pharmacy and which med where: 90 day supply/mail order: [place] [facility]: [facility]  DM supplies: [facility]   Allergies verified: UTD   Immunization Status: Flu vaccine-- 01/12/14 Tdap-- 01/28/12 PNA-- 03/11/13 (23)  Shingles--08/19/07  MMG-- 06/25/08 with Johnnette Gourd, MD at [facility]   Past Surgical History  Procedure Laterality Date  . Cardiac catheterization  06/03/01  . Cholecystectomy  1970  . Tubal ligation  1972  . Abdominal hysterectomy  1982  . Insert / replace / remove pacemaker  2007; 2012    w/AICD  . Coronary artery bypass graft  2003    CABG X4  . Cataract extraction      left eye  . Cardioversion N/A 07/30/2014    Procedure: CARDIOVERSION;  Surgeon: Jolaine Artist, MD;  Location: [facility] ENDOSCOPY;  Service: Cardiovascular;  Laterality: N/A;  . Refractive surgery      right eye  . Cardioversion N/A 11/25/2014     Procedure: CARDIOVERSION;  Surgeon: Evans Lance, MD;  Location: [facility];  Service: Cardiovascular;  Laterality: N/A;   Social History:  reports that she has never smoked. She has never used smokeless tobacco. She reports that she does not drink alcohol or use illicit drugs.  Allergies  Allergen Reactions  . Sulfonamide Derivatives Other (See Comments)    Unknown; "childhood allergy/mother"    Family History  Problem Relation Age of Onset  . Kidney cancer Mother     kidney and female repo - died @ 26  . Asthma Mother   . Cancer Mother     gyn and renal  . Heart disease Mother     CAD  . Heart disease Father     died @ 60  . Stroke Father   . Diabetes Father   . Hypertension Father   . Hyperlipidemia Father   . Hypertension Son   . Hypertension Maternal Aunt   . Kidney cancer Maternal Uncle   . Cancer Maternal Uncle   . Cirrhosis Maternal Grandmother     non alcohol  . Cancer Maternal Grandfather   . Kidney cancer Maternal Grandfather      Prior to Admission medications   Medication Sig Start Date End Date Taking? Authorizing Provider  acetaminophen (TYLENOL) 500 MG tablet Take 1,000 mg by mouth every 6 (six) hours as needed (pain).   Yes Historical Provider, MD  albuterol (PROVENTIL HFA;VENTOLIN HFA) 108 (90 BASE) MCG/ACT inhaler Inhale 2 puffs into the lungs every 6 (six) hours as needed for wheezing or shortness of breath. 12/26/12  Yes Mosie Lukes, MD  amiodarone (PACERONE) 200 MG tablet Take 1 tablet (200 mg total) by mouth 2 (two) times daily. 10/14/14  Yes Evans Lance, MD  atorvastatin (LIPITOR) 20 MG tablet Take 1 tablet (20 mg total) by mouth daily. 08/31/14  Yes Mosie Lukes, MD  carvedilol (COREG) 25 MG tablet Take 2 tablets by mouth 2 (two) times daily.  07/27/14  Yes Historical Provider, MD  escitalopram (LEXAPRO) 10 MG tablet Take 1 tablet (10 mg total) by mouth daily. 08/31/14  Yes Mosie Lukes, MD  fenofibrate (TRICOR) 48 MG tablet Take 1 tablet  (48 mg total) by mouth daily. 07/15/14  Yes Larey Dresser, MD  fluticasone [facility]) 50 MCG/ACT nasal spray Place 2 sprays into both nostrils daily as needed for allergies or rhinitis. As needed for nasal stuffiness. 01/13/14  Yes Mosie Lukes, MD  gabapentin (NEURONTIN) 300 MG capsule Take 1 capsule (300 mg total) by mouth 2 (two) times daily. 08/31/14  Yes Mosie Lukes, MD  insulin NPH (HUMULIN  N,NOVOLIN N) 100 UNIT/ML injection Inject 20-45 Units into the skin 2 (two) times daily before a meal. Take 45 units in the morning and 15 units in the evening   Yes Historical Provider, MD  insulin regular (NOVOLIN R,HUMULIN R) 100 units/mL injection Change to 15 units three times a with meals (to cover all meals--this is the same total amount but spread out) Patient taking differently: Inject 15 Units into the skin 2 (two) times daily before a meal. Change to 15 units three times a with meals (to cover all meals--this is the same total amount but spread out) 04/01/13  Yes Ivan Anchors Love, PA-C  levothyroxine (SYNTHROID, LEVOTHROID) 25 MCG tablet Take 25 mcg by mouth daily before breakfast.   Yes Historical Provider, MD  LORazepam (ATIVAN) 0.5 MG tablet Take 1 tablet (0.5 mg total) by mouth at bedtime as needed for anxiety. 08/31/14  Yes Mosie Lukes, MD  torsemide (DEMADEX) 20 MG tablet Take 40mg  in morning and 20mg  in evening. May take an extra tab for 3# weight gain, swelling or SOB 10/29/14  Yes Bonnielee Haff, MD  Vitamin D, Ergocalciferol, (DRISDOL) 50000 UNITS CAPS capsule Take 50,000 Units by mouth every 7 (seven) days. Tuesdays   Yes Historical Provider, MD  warfarin (COUMADIN) 5 MG tablet Take 1 tablet (5 mg total) by mouth daily at 6 PM. 10/29/14  Yes Bonnielee Haff, MD  glucose blood (FREESTYLE LITE) test strip DX: 250.60  Check sugars bid and as needed 05/22/13   Mosie Lukes, MD  NON FORMULARY Oxygen---24 hour    Historical Provider, MD   Physical Exam: Filed Vitals:   11/27/14 0022  BP:  124/43  Pulse: 72  Temp:   Resp: 18    BP 124/43 mmHg  Pulse 72  Temp(Src) 99.4 F (37.4 C) (Oral)  Resp 18  Ht 5\' 4"  (1.626 m)  Wt 117.482 kg (259 lb)  BMI 44.44 kg/m2  SpO2 93%  General Appearance:    Alert, oriented, no distress, appears stated age  Head:    Normocephalic, atraumatic  Eyes:    PERRL, EOMI, sclera non-icteric        Nose:   Nares without drainage or epistaxis. Mucosa, turbinates normal  Throat:   Moist mucous membranes. Oropharynx without erythema or exudate.  Neck:   Supple. No carotid bruits.  No thyromegaly.  No lymphadenopathy.   Back:     No CVA tenderness, no spinal tenderness  Lungs:     Clear to auscultation bilaterally, without wheezes, rhonchi or rales  Chest wall:    No tenderness to palpitation  Heart:    Regular rate and rhythm without murmurs, gallops, rubs  Abdomen:     Soft, non-tender, nondistended, normal bowel sounds, no organomegaly  Genitalia:    deferred  Rectal:    deferred  Extremities:   No clubbing, cyanosis or edema.  Pulses:   2+ and symmetric all extremities  Skin:   Skin color, texture, turgor normal, no rashes or lesions  Lymph nodes:   Cervical, supraclavicular, and axillary nodes normal  Neurologic:   CNII-XII intact. Normal strength, sensation and reflexes      throughout    Labs on Admission:  Basic Metabolic Panel:  Recent Labs Lab 11/23/14 1131 11/26/14 2234  NA 138 137  K 4.4 4.6  CL 98 97*  CO2 34* 30  GLUCOSE 100* 211*  BUN 64* 58*  CREATININE 2.88* 2.86*  CALCIUM 9.2 8.8*   Liver Function Tests: No results  for input(s): AST, ALT, ALKPHOS, BILITOT, PROT, ALBUMIN in the last 168 hours. No results for input(s): LIPASE, AMYLASE in the last 168 hours. No results for input(s): AMMONIA in the last 168 hours. CBC:  Recent Labs Lab 11/23/14 1131 11/26/14 2234  WBC 6.8 9.9  NEUTROABS 4.6  --   HGB 10.5* 10.7*  HCT 31.6* 34.2*  MCV 88.1 91.7  PLT 168.0 172   Cardiac Enzymes: No results for  input(s): CKTOTAL, CKMB, CKMBINDEX, TROPONINI in the last 168 hours.  BNP (last 3 results) No results for input(s): PROBNP in the last 8760 hours. CBG:  Recent Labs Lab 11/25/14 1222  GLUCAP 101*    Radiological Exams on Admission: Dg Chest Port 1 View  11/26/2014   CLINICAL DATA:  74 year old female with shortness of breath  EXAM: PORTABLE CHEST - 1 VIEW  COMPARISON:  Radiograph dated 10/27/2014  FINDINGS: The lungs are hypovolemic. There is diffuse band lateral interstitial and vascular prominence compatible with congestive changes. Cardiomegaly. Left pectoral AICD device and median sternotomy wires noted.  IMPRESSION: Cardiomegaly with increased vascular and interstitial prominence compatible with congestive changes. Clinical correlation and follow-up recommended.   Electronically Signed   By: Anner Crete M.D.   On: 11/26/2014 23:25    EKG: Independently reviewed.  Assessment/Plan Principal Problem:   Acute on chronic combined systolic and diastolic congestive heart failure Active Problems:   ATRIAL FIBRILLATION, PAROXYSMAL   Hypothyroidism   Type 2 diabetes mellitus, controlled, with renal complications   Acute on chronic respiratory failure with hypoxia   Acute exacerbation of CHF (congestive heart failure)   1. Acute on chronic CHF - 1. Got lasix 60mg  in ED 2. CHF pathway 3. Will defer diuretic orders to cardiology given her very tenous kidney function which is baseline 4. No ACEi due to kidneys 5. Already on beta blocker 6. Tele monitor 7. Strict intake and output 2. CKD stage 4 - kidney function appears to be at baseline and slightly better than end of June, currently creatinine is 2.8 and BUN 58 1. Daily BMP with diuresis 3. DM2 - 1. Continue home NPH 2. Putting patient on 6 units with meals TID 3. Moderate dose SSI 4. Hypothyroidism - continue synthroid 5. A.Fib - continue Amiodarone for rhythm control, continue coumadin per pharm 6. Acute on chronic resp  failure - 1. Continuous pulse ox 2. Add BIPAP PRN    Code Status: Full Code  Family Communication: Family at bedside Disposition Plan: Admit to inpatient   Time spent: 70 min  Donjuan Robison M. [facility] Pager [phone]  If 7AM-7PM, please contact the day team taking care of the patient [email or web address] Password [email or web address] 11/27/2014, 12:30 AM

## 2014-11-28 LAB — BASIC METABOLIC PANEL
Anion gap: 7 (ref 5–15)
BUN: 68 mg/dL — ABNORMAL HIGH (ref 6–20)
CALCIUM: 8.6 mg/dL — AB (ref 8.9–10.3)
CO2: 31 mmol/L (ref 22–32)
Chloride: 99 mmol/L — ABNORMAL LOW (ref 101–111)
Creatinine, Ser: 3.11 mg/dL — ABNORMAL HIGH (ref 0.44–1.00)
GFR, EST AFRICAN AMERICAN: 16 mL/min — AB (ref >60)
GFR, EST NON AFRICAN AMERICAN: 14 mL/min — AB (ref >60)
GLUCOSE: 58 mg/dL — AB (ref 65–99)
POTASSIUM: 3.8 mmol/L (ref 3.5–5.1)
SODIUM: 137 mmol/L (ref 135–145)

## 2014-11-28 LAB — GLUCOSE, CAPILLARY
GLUCOSE-CAPILLARY: 64 mg/dL — AB (ref 65–99)
GLUCOSE-CAPILLARY: 143 mg/dL — AB (ref 65–99)
GLUCOSE-CAPILLARY: 163 mg/dL — AB (ref 65–99)
Glucose-Capillary: 141 mg/dL — ABNORMAL HIGH (ref 65–99)

## 2014-11-28 LAB — CBC
HEMATOCRIT: 29.9 % — AB (ref 36.0–46.0)
HEMOGLOBIN: 9.4 g/dL — AB (ref 12.0–15.0)
MCH: 28.7 pg (ref 26.0–34.0)
MCHC: 31.4 g/dL (ref 30.0–36.0)
MCV: 91.2 fL (ref 78.0–100.0)
Platelets: 163 10*3/uL (ref 150–400)
RBC: 3.28 MIL/uL — AB (ref 3.87–5.11)
RDW: 15.1 % (ref 11.5–15.5)
WBC: 7.5 10*3/uL (ref 4.0–10.5)

## 2014-11-28 LAB — PROTIME-INR
INR: 2.58 — ABNORMAL HIGH (ref 0.00–1.49)
Prothrombin Time: 27.3 seconds — ABNORMAL HIGH (ref 11.6–15.2)

## 2014-11-28 MED ORDER — TORSEMIDE 20 MG PO TABS: ORAL_TABLET | ORAL | Status: DC

## 2014-11-28 NOTE — Discharge Instructions (Signed)

## 2014-11-28 NOTE — Progress Notes (Addendum)
SUBJECTIVE:  Feels well. Wants to go home.  OBJECTIVE:   Vitals:   Filed Vitals:   11/27/14 2017 11/27/14 2300 11/28/14 0357 11/28/14 0733  BP:  103/40 89/48 118/90  Pulse: 70 70 70 70  Temp: 97.6 F (36.4 C) 97.6 F (36.4 C) 97.5 F (36.4 C) 97.5 F (36.4 C)  TempSrc: Oral Oral Oral Oral  Resp: 17 15 19 22   Height:      Weight:   257 lb 4.8 oz (116.711 kg)   SpO2: 99% 92% 91% 100%   I&O's:   Intake/Output Summary (Last 24 hours) at 11/28/14 1127 Last data filed at 11/28/14 0800  Gross per 24 hour  Intake    600 ml  Output   1300 ml  Net   -700 ml   TELEMETRY: Reviewed telemetry pt in paced rhythm:     PHYSICAL EXAM General: Well developed, well nourished, in no acute distress Head:   Normal cephalic and atramatic  Lungs:   Clear bilaterally to auscultation. Heart: HRRR S1 S2  No JVD.   Abdomen: abdomen soft and non-tender, obese Msk:  Back normal,  Normal strength and tone for age. Extremities:   No edema.   Neuro: Alert and oriented. Psych:  Normal affect, responds appropriately Skin: No rash   LABS: Basic Metabolic Panel:  Recent Labs  11/26/14 2234 11/28/14 0311  NA 137 137  K 4.6 3.8  CL 97* 99*  CO2 30 31  GLUCOSE 211* 58*  BUN 58* 68*  CREATININE 2.86* 3.11*  CALCIUM 8.8* 8.6*   Liver Function Tests: No results for input(s): AST, ALT, ALKPHOS, BILITOT, PROT, ALBUMIN in the last 72 hours. No results for input(s): LIPASE, AMYLASE in the last 72 hours. CBC:  Recent Labs  11/26/14 2234 11/28/14 0311  WBC 9.9 7.5  HGB 10.7* 9.4*  HCT 34.2* 29.9*  MCV 91.7 91.2  PLT 172 163   Cardiac Enzymes: No results for input(s): CKTOTAL, CKMB, CKMBINDEX, TROPONINI in the last 72 hours. BNP: Invalid input(s): POCBNP D-Dimer: No results for input(s): DDIMER in the last 72 hours. Hemoglobin A1C: No results for input(s): HGBA1C in the last 72 hours. Fasting Lipid Panel: No results for input(s): CHOL, HDL, LDLCALC, TRIG, CHOLHDL, LDLDIRECT in  the last 72 hours. Thyroid Function Tests: No results for input(s): TSH, T4TOTAL, T3FREE, THYROIDAB in the last 72 hours.  Invalid input(s): FREET3 Anemia Panel: No results for input(s): VITAMINB12, FOLATE, FERRITIN, TIBC, IRON, RETICCTPCT in the last 72 hours. Coag Panel:   Lab Results  Component Value Date   INR 2.58* 11/28/2014   INR 2.51* 11/27/2014   INR 2.36* 11/26/2014   PROTIME 17.9 11/01/2008   PROTIME 15.7 10/18/2008    RADIOLOGY: Dg Chest Port 1 View  11/26/2014   CLINICAL DATA:  74 year old female with shortness of breath  EXAM: PORTABLE CHEST - 1 VIEW  COMPARISON:  Radiograph dated 10/27/2014  FINDINGS: The lungs are hypovolemic. There is diffuse band lateral interstitial and vascular prominence compatible with congestive changes. Cardiomegaly. Left pectoral AICD device and median sternotomy wires noted.  IMPRESSION: Cardiomegaly with increased vascular and interstitial prominence compatible with congestive changes. Clinical correlation and follow-up recommended.   Electronically Signed   By: Anner Crete M.D.   On: 11/26/2014 23:25      ASSESSMENT: Kathyrn Lass:    1) acute on chronic systolic heart failure: Appears euvolemic at this point. Creatinine is increased slightly, but still within the range that she has had during the past year. We'll  hold evening dose of torsemide. Would restart usual home dose of torsemide tomorrow with 40 mg in the morning and 20 mg in the afternoon. She has lab work scheduled with nephrology on Wednesday.  I informed Dr. Marval Regal and he will follow up on these labs.  Atrial fibrillation: INR therapeutic.  Okay to discharge from cardiac standpoint.  Jettie Booze, MD  11/28/2014  11:27 AM

## 2014-11-28 NOTE — Progress Notes (Signed)
ANTICOAGULATION CONSULT NOTE - Follow Up Consult  Pharmacy Consult for warfarin Indication: atrial fibrillation  Allergies  Allergen Reactions  . Sulfonamide Derivatives Other (See Comments)    Unknown; "childhood allergy/mother"    Patient Measurements: Height: 5\' 4"  (162.6 cm) Weight: 257 lb 4.8 oz (116.711 kg) IBW/kg (Calculated) : 54.7  Vital Signs: Temp: 97.5 F (36.4 C) (07/17 0733) Temp Source: Oral (07/17 0733) BP: 118/90 mmHg (07/17 0733) Pulse Rate: 70 (07/17 0733)  Labs:  Recent Labs  11/26/14 2234 11/27/14 0235 11/28/14 0311  HGB 10.7*  --  9.4*  HCT 34.2*  --  29.9*  PLT 172  --  163  LABPROT 25.5* 26.7* 27.3*  INR 2.36* 2.51* 2.58*  CREATININE 2.86*  --  3.11*    Estimated Creatinine Clearance: 20.5 mL/min (by C-G formula based on Cr of 3.11).  Assessment: 74 yo f admitted with SOB.  Patient is on warfarin PTA for afib.  Home dose is 5 mg daily.  INR has been therapeutic since admission and today is 2.58. CBC stable.  Goal of Therapy:  INR 2-3 Monitor platelets by anticoagulation protocol: Yes   Plan:  Continue home warfarin dose of 5 mg daily Follow INR  Stewart, Cassie L 11/28/2014,10:27 AM

## 2014-11-28 NOTE — Discharge Summary (Signed)
Physician Discharge Summary  Destiny [name] YQI:[id number] DOB: Sep 23, 1940 DOA: 11/26/2014  PCP: Penni Homans, MD  Admit date: 11/26/2014 Discharge date: 11/28/2014  Recommendations for Outpatient Follow-up:  1. Pt will need to follow up with PCP in 2-3 weeks post discharge 2. Please obtain BMP to evaluate electrolytes and kidney function 3. Please also check CBC to evaluate Hg and Hct levels 4. Please note that pt reported she is no longer taking statin or coreg   Discharge Diagnoses:  Principal Problem:   Acute on chronic combined systolic and diastolic congestive heart failure Active Problems:   ATRIAL FIBRILLATION, PAROXYSMAL   Hypothyroidism   Type 2 diabetes mellitus, controlled, with renal complications   Acute on chronic respiratory failure with hypoxia   Acute exacerbation of CHF (congestive heart failure)  Discharge Condition: Stable  Diet recommendation: Heart healthy diet discussed in details   History of present illness:  74 y.o. Obese female with CAD, s/p CABG in 2003, ischemic cardiomyopathy, severe LV systolic dysfunction, chronic systolic HF, LVEF 38-75% by a recent echo on 10/20/14, s/p [employer] CRTD implanted in 2007 with generator change out in 2012, CKD-III, paroxysmal AF, on coumadin and Amiodarone, home O2 2-3 liters, recently underwent DCCV by Dr. Lovena Le on 11/25/14, presented to ER for evaluation of SOB. She reported that she did not take her torsemide on the day of cardioversion and next morning her weight was up by 2 lbs so she took extra 20 mg of Torsemide from her usual dose of 40 in am and 20 in pm.   Hospital Course:  Principal Problem:   Acute on chronic respiratory failure due combined systolic and diastolic congestive heart failure - pt on oxygen at home  - pt reports feeling better, denies shortness of breath  - per cardiology team, she is ok to go home on her regular regimen with no additional changes  - weight on discharge is 116 kg, pt  made aware she needs to monitor her weight closely and report any weight gain of 3+ lbs to her PCP Active Problems:   ATRIAL FIBRILLATION, PAROXYSMAL - rate controlled, continue coumadin  - continue amiodarone as per home medical regimen    Hypothyroidism - continue synthroid    Type 2 diabetes mellitus, controlled, with renal complications - continue insulin per home medical regimen    Acute on chronic kidney disease, stage III - Cr up this AM from diuretics - needs close outpatient follow up   Morbid obesity - Body mass index is 44.14 kg/(m^2).   Code Status: Full Code  Family Communication: Family at bedside Disposition Plan: Admit to inpatient   Procedures/Studies: Dg Chest Port 1 View  11/26/2014   CLINICAL DATA:  74 year old female with shortness of breath  EXAM: PORTABLE CHEST - 1 VIEW  COMPARISON:  Radiograph dated 10/27/2014  FINDINGS: The lungs are hypovolemic. There is diffuse band lateral interstitial and vascular prominence compatible with congestive changes. Cardiomegaly. Left pectoral AICD device and median sternotomy wires noted.  IMPRESSION: Cardiomegaly with increased vascular and interstitial prominence compatible with congestive changes. Clinical correlation and follow-up recommended.   Electronically Signed   By: Anner Crete M.D.   On: 11/26/2014 23:25   Discharge Exam: Filed Vitals:   11/28/14 0733  BP: 118/90  Pulse: 70  Temp: 97.5 F (36.4 C)  Resp: 22   Filed Vitals:   11/27/14 2017 11/27/14 2300 11/28/14 0357 11/28/14 0733  BP:  103/40 89/48 118/90  Pulse: 70 70 70 70  Temp: 97.6 F (36.4 C) 97.6 F (36.4 C) 97.5 F (36.4 C) 97.5 F (36.4 C)  TempSrc: Oral Oral Oral Oral  Resp: 17 15 19 22   Height:      Weight:   116.711 kg (257 lb 4.8 oz)   SpO2: 99% 92% 91% 100%    General: Pt is alert, follows commands appropriately, not in acute distress Cardiovascular: Irregular rate and rhythm, no rubs, no gallops Respiratory: Clear to  auscultation bilaterally, no wheezing, no crackles, no rhonchi Abdominal: Soft, non tender, non distended, bowel sounds +, no guarding Extremities: no cyanosis, pulses palpable bilaterally DP and PT  Discharge Instructions  Discharge Instructions    Diet - low sodium heart healthy    Complete by:  As directed      Increase activity slowly    Complete by:  As directed             Medication List    STOP taking these medications        atorvastatin 20 MG tablet  Commonly known as:  LIPITOR     carvedilol 25 MG tablet  Commonly known as:  COREG     fenofibrate 48 MG tablet  Commonly known as:  TRICOR      TAKE these medications        acetaminophen 500 MG tablet  Commonly known as:  TYLENOL  Take 1,000 mg by mouth every 6 (six) hours as needed (pain).     albuterol 108 (90 BASE) MCG/ACT inhaler  Commonly known as:  PROVENTIL HFA;VENTOLIN HFA  Inhale 2 puffs into the lungs every 6 (six) hours as needed for wheezing or shortness of breath.     amiodarone 200 MG tablet  Commonly known as:  PACERONE  Take 1 tablet (200 mg total) by mouth 2 (two) times daily.     escitalopram 10 MG tablet  Commonly known as:  LEXAPRO  Take 1 tablet (10 mg total) by mouth daily.     fluticasone 50 MCG/ACT nasal spray  Commonly known as:  FLONASE  Place 2 sprays into both nostrils daily as needed for allergies or rhinitis. As needed for nasal stuffiness.     gabapentin 300 MG capsule  Commonly known as:  NEURONTIN  Take 1 capsule (300 mg total) by mouth 2 (two) times daily.     glucose blood test strip  Commonly known as:  FREESTYLE LITE  DX: 250.60  Check sugars bid and as needed     insulin NPH Human 100 UNIT/ML injection  Commonly known as:  HUMULIN N,NOVOLIN N  Inject 20-45 Units into the skin 2 (two) times daily before a meal. Take 45 units in the morning and 15 units in the evening     insulin regular 100 units/mL injection  Commonly known as:  NOVOLIN R,HUMULIN R  Change  to 15 units three times a with meals (to cover all meals--this is the same total amount but spread out)     levothyroxine 25 MCG tablet  Commonly known as:  SYNTHROID, LEVOTHROID  Take 25 mcg by mouth daily before breakfast.     LORazepam 0.5 MG tablet  Commonly known as:  ATIVAN  Take 1 tablet (0.5 mg total) by mouth at bedtime as needed for anxiety.     NON FORMULARY  Oxygen---24 hour     torsemide 20 MG tablet  Commonly known as:  DEMADEX  Take 40mg  in morning and 20mg  in evening. May take an extra tab for 3#  weight gain, swelling or SOB. Start taking from 11/29/2014     Vitamin D (Ergocalciferol) 50000 UNITS Caps capsule  Commonly known as:  DRISDOL  Take 50,000 Units by mouth every 7 (seven) days. Tuesdays     warfarin 5 MG tablet  Commonly known as:  COUMADIN  Take 1 tablet (5 mg total) by mouth daily at 6 PM.            Follow-up Information    Follow up with Penni Homans, MD.   Specialty:  Family Medicine   Contact information:   [place] [address] [phone]       Call Faye Ramsay, MD.   Specialty:  Internal Medicine   Why:  As needed   Contact information:   [address] [place] [address] [phone]        The results of significant diagnostics from this hospitalization (including imaging, microbiology, ancillary and laboratory) are listed below for reference.     Microbiology: Recent Results (from the past 240 hour(s))  MRSA PCR Screening     Status: None   Collection Time: 11/27/14 12:57 AM  Result Value Ref Range Status   MRSA by PCR NEGATIVE NEGATIVE Final    Comment:        The GeneXpert MRSA Assay (FDA approved for NASAL specimens only), is one component of a comprehensive MRSA colonization surveillance program. It is not intended to diagnose MRSA infection nor to guide or monitor treatment for MRSA infections.      Labs: Basic Metabolic Panel:  Recent  Labs Lab 11/23/14 1131 11/26/14 2234 11/28/14 0311  NA 138 137 137  K 4.4 4.6 3.8  CL 98 97* 99*  CO2 34* 30 31  GLUCOSE 100* 211* 58*  BUN 64* 58* 68*  CREATININE 2.88* 2.86* 3.11*  CALCIUM 9.2 8.8* 8.6*   CBC:  Recent Labs Lab 11/23/14 1131 11/26/14 2234 11/28/14 0311  WBC 6.8 9.9 7.5  NEUTROABS 4.6  --   --   HGB 10.5* 10.7* 9.4*  HCT 31.6* 34.2* 29.9*  MCV 88.1 91.7 91.2  PLT 168.0 172 163    BNP (last 3 results)  Recent Labs  09/25/14 2159 10/24/14 2233 11/26/14 2236  BNP 237.5* 215.2* 298.3*   CBG:  Recent Labs Lab 11/27/14 1135 11/27/14 1623 11/27/14 2125 11/28/14 0730 11/28/14 0842  GLUCAP 118* 120* 141* 64* 143*   SIGNED: Time coordinating discharge: Over 30 minutes  Faye Ramsay, MD  [facility] 11/28/2014, 11:38 AM Pager [phone]  If 7PM-7AM, please contact night-coverage [email or web address] Password [email or web address]

## 2014-11-30 ENCOUNTER — Other Ambulatory Visit (HOSPITAL_COMMUNITY): Payer: Self-pay | Admitting: Cardiology

## 2014-11-30 ENCOUNTER — Telehealth: Payer: Self-pay | Admitting: Family Medicine

## 2014-11-30 DIAGNOSIS — I5022 Chronic systolic (congestive) heart failure: Secondary | ICD-10-CM

## 2014-11-30 MED ORDER — ATORVASTATIN CALCIUM 20 MG PO TABS: 20.0000 mg | ORAL_TABLET | Freq: Every day | ORAL | Status: DC

## 2014-11-30 MED ORDER — CARVEDILOL 25 MG PO TABS: 50.0000 mg | ORAL_TABLET | Freq: Two times a day (BID) | ORAL | Status: DC

## 2014-11-30 MED ORDER — FENOFIBRATE 48 MG PO TABS: 48.0000 mg | ORAL_TABLET | Freq: Every day | ORAL | Status: DC

## 2014-11-30 NOTE — Telephone Encounter (Signed)
Prescription sent in as requested and patient informed

## 2014-11-30 NOTE — Telephone Encounter (Signed)
Caller name: Dulcy Relation to pt: self Call back number:   [phone] Pharmacy: [facility]  Reason for call:   Patient is requesting atorvastatin refill

## 2014-12-01 ENCOUNTER — Encounter: Payer: Self-pay | Admitting: Pulmonary Disease

## 2014-12-01 ENCOUNTER — Ambulatory Visit (INDEPENDENT_AMBULATORY_CARE_PROVIDER_SITE_OTHER): Payer: Medicare Other | Admitting: Pulmonary Disease

## 2014-12-01 VITALS — BP 132/62 | HR 70 | Temp 98.2°F | Ht 64.0 in | Wt 255.8 lb

## 2014-12-01 DIAGNOSIS — I639 Cerebral infarction, unspecified: Secondary | ICD-10-CM

## 2014-12-01 DIAGNOSIS — J9611 Chronic respiratory failure with hypoxia: Secondary | ICD-10-CM | POA: Diagnosis not present

## 2014-12-01 DIAGNOSIS — G4733 Obstructive sleep apnea (adult) (pediatric): Secondary | ICD-10-CM | POA: Diagnosis not present

## 2014-12-01 DIAGNOSIS — N181 Chronic kidney disease, stage 1: Secondary | ICD-10-CM | POA: Diagnosis not present

## 2014-12-01 NOTE — Progress Notes (Signed)
   Subjective:    [name], female    DOB: 1940/07/21, 74 y.o.   MRN: [id number]  HPI 74 year old never smoker for follow-up of obstructive sleep apnea , maintained on CPAP since 2003. She has a history of  ischemic cardiomyopathy, s/p biventricular ICD implantation.  She has a history of chronic congestive heart failure, with a chronically elevated BNP. She also has chronic renal insufficiency, is oxygen dependent, and is on chronic anticoagulation for paroxysmal atrial fibrillation.  She has never smoked, but was told around the age of 77 that she may have asthma. She is also morbidly obese, and is very deconditioned with musculoskeletal debility.  12/01/2014  Chief Complaint  Patient presents with  . Follow-up    Former [facility] pt; having to use 3l daily, using increased pressure on CPAP at night.    she has been maintained on CPAP for many years and is compliant.download has never been checked on her machine  On 2l O2 , 3l pulse on walking, 5L blended into CPAP during sleep (increased air flow per husband -was told to do this at the hospital) DME- [facility] RA satn -87%  She had hospital admissions 7/15 for acute on chronic systolic heart failure .I note 4 admits in the last 6 months  Significant tests/ events  NPSG 2003:  AHI 41/hr, cpap titrated to 10cm PFTs 10/2014 >>FEV1 48%, no BD response, TLC 50%, DLCO 44%   Review of Systems neg for any significant sore throat, dysphagia, itching, sneezing, nasal congestion or excess/ purulent secretions, fever, chills, sweats, unintended wt loss, pleuritic or exertional cp, hempoptysis, orthopnea pnd or change in chronic leg swelling. Also denies presyncope, palpitations, heartburn, abdominal pain, nausea, vomiting, diarrhea or change in bowel or urinary habits, dysuria,hematuria, rash, arthralgias, visual complaints, headache, numbness weakness or ataxia.     Objective:   Physical Exam  Gen. Pleasant, obese, in no  distress ENT - no lesions, no post nasal drip Neck: No JVD, no thyromegaly, no carotid bruits Lungs: no use of accessory muscles, no dullness to percussion, decreased without rales or rhonchi  Cardiovascular: Rhythm regular, heart sounds  normal, no murmurs or gallops, no peripheral edema Musculoskeletal: No deformities, no cyanosis or clubbing , no tremors       Assessment & Plan:

## 2014-12-01 NOTE — Patient Instructions (Signed)
Check CPAP download - [facility] Nocturnal oxygen check during sleep

## 2014-12-02 ENCOUNTER — Ambulatory Visit (INDEPENDENT_AMBULATORY_CARE_PROVIDER_SITE_OTHER): Payer: Medicare Other | Admitting: Cardiology

## 2014-12-02 DIAGNOSIS — I4891 Unspecified atrial fibrillation: Secondary | ICD-10-CM | POA: Diagnosis not present

## 2014-12-02 DIAGNOSIS — I480 Paroxysmal atrial fibrillation: Secondary | ICD-10-CM

## 2014-12-02 DIAGNOSIS — I5043 Acute on chronic combined systolic (congestive) and diastolic (congestive) heart failure: Secondary | ICD-10-CM | POA: Diagnosis not present

## 2014-12-02 DIAGNOSIS — E1129 Type 2 diabetes mellitus with other diabetic kidney complication: Secondary | ICD-10-CM | POA: Diagnosis not present

## 2014-12-02 DIAGNOSIS — N183 Chronic kidney disease, stage 3 (moderate): Secondary | ICD-10-CM | POA: Diagnosis not present

## 2014-12-02 DIAGNOSIS — Z5181 Encounter for therapeutic drug level monitoring: Secondary | ICD-10-CM

## 2014-12-02 DIAGNOSIS — I129 Hypertensive chronic kidney disease with stage 1 through stage 4 chronic kidney disease, or unspecified chronic kidney disease: Secondary | ICD-10-CM | POA: Diagnosis not present

## 2014-12-02 DIAGNOSIS — C8590 Non-Hodgkin lymphoma, unspecified, unspecified site: Secondary | ICD-10-CM | POA: Diagnosis not present

## 2014-12-02 LAB — POCT INR: INR: 3

## 2014-12-02 NOTE — Assessment & Plan Note (Signed)
Download will be checked, CPAP supplies will be renewed for a year  Weight loss encouraged, compliance with goal of at least 4-6 hrs every night is the expectation. Advised against medications with sedative side effects Cautioned against driving when sleepy - understanding that sleepiness will vary on a day to day basis

## 2014-12-02 NOTE — Assessment & Plan Note (Signed)
She does seem to require 3 L pulse on walking We'll check nocturnal oximetry and titrate oxygen blended into CPAP as required

## 2014-12-14 DIAGNOSIS — I5043 Acute on chronic combined systolic (congestive) and diastolic (congestive) heart failure: Secondary | ICD-10-CM | POA: Diagnosis not present

## 2014-12-14 DIAGNOSIS — N183 Chronic kidney disease, stage 3 (moderate): Secondary | ICD-10-CM | POA: Diagnosis not present

## 2014-12-14 DIAGNOSIS — C8590 Non-Hodgkin lymphoma, unspecified, unspecified site: Secondary | ICD-10-CM | POA: Diagnosis not present

## 2014-12-14 DIAGNOSIS — E1129 Type 2 diabetes mellitus with other diabetic kidney complication: Secondary | ICD-10-CM | POA: Diagnosis not present

## 2014-12-14 DIAGNOSIS — I4891 Unspecified atrial fibrillation: Secondary | ICD-10-CM | POA: Diagnosis not present

## 2014-12-14 DIAGNOSIS — I129 Hypertensive chronic kidney disease with stage 1 through stage 4 chronic kidney disease, or unspecified chronic kidney disease: Secondary | ICD-10-CM | POA: Diagnosis not present

## 2014-12-14 LAB — POCT INR: INR: 3.5

## 2014-12-15 ENCOUNTER — Other Ambulatory Visit: Payer: Self-pay | Admitting: Internal Medicine

## 2014-12-15 ENCOUNTER — Ambulatory Visit (INDEPENDENT_AMBULATORY_CARE_PROVIDER_SITE_OTHER): Payer: Medicare Other | Admitting: Internal Medicine

## 2014-12-15 ENCOUNTER — Ambulatory Visit (INDEPENDENT_AMBULATORY_CARE_PROVIDER_SITE_OTHER): Payer: Medicare Other | Admitting: Nurse Practitioner

## 2014-12-15 ENCOUNTER — Encounter: Payer: Self-pay | Admitting: Nurse Practitioner

## 2014-12-15 VITALS — BP 120/60 | HR 71 | Ht 64.0 in | Wt 258.0 lb

## 2014-12-15 DIAGNOSIS — I5022 Chronic systolic (congestive) heart failure: Secondary | ICD-10-CM | POA: Diagnosis not present

## 2014-12-15 DIAGNOSIS — I481 Persistent atrial fibrillation: Secondary | ICD-10-CM

## 2014-12-15 DIAGNOSIS — Z5181 Encounter for therapeutic drug level monitoring: Secondary | ICD-10-CM

## 2014-12-15 DIAGNOSIS — I639 Cerebral infarction, unspecified: Secondary | ICD-10-CM | POA: Diagnosis not present

## 2014-12-15 DIAGNOSIS — I4819 Other persistent atrial fibrillation: Secondary | ICD-10-CM

## 2014-12-15 LAB — CUP PACEART INCLINIC DEVICE CHECK
Date Time Interrogation Session: 20160804164827
Lead Channel Setting Pacing Amplitude: 2.25 V
Lead Channel Setting Pacing Pulse Width: 0.5 ms
MDC IDC PG SERIAL: 631685
MDC IDC SET LEADCHNL LV PACING PULSEWIDTH: 0.6 ms
MDC IDC SET LEADCHNL RA PACING AMPLITUDE: 1.75 V
MDC IDC SET LEADCHNL RV PACING AMPLITUDE: 2.5 V
MDC IDC SET LEADCHNL RV SENSING SENSITIVITY: 0.5 mV
MDC IDC SET ZONE DETECTION INTERVAL: 250 ms
MDC IDC SET ZONE DETECTION INTERVAL: 345 ms
Zone Setting Detection Interval: 310 ms

## 2014-12-15 MED ORDER — AMIODARONE HCL 200 MG PO TABS: 200.0000 mg | ORAL_TABLET | Freq: Every day | ORAL | Status: DC

## 2014-12-15 NOTE — Patient Instructions (Signed)
Medication Instructions:  START TAKING AMIODARONE ONCE A DAY     Labwork:  NONE ORDER TODAY    Testing/Procedures:   NONE ORDER TODAY   Follow-Up:  Your physician wants you to follow-up in: [place] will receive a reminder letter in the mail two months in advance. If you don't receive a letter, please call our office to schedule the follow-up appointment.    Any Other Special Instructions Will Be Listed Below (If Applicable).

## 2014-12-15 NOTE — Progress Notes (Signed)
Electrophysiology Office Note Date: 12/15/2014  ID:  Destiny [name], DOB [birth year], MRN [id number]  PCP: Penni Homans, MD Primary Cardiologist: Bensimhon ([facility] clinic) Electrophysiologist: Lovena Le  CC: AF and HF follow up  Destiny [name] is a 74 y.o. female seen today for Dr Lovena Le.  She recently underwent DCCV after being loaded with amiodarone and then was readmitted for worsening heart failure. She was diuresed and presents today for follow up.   Since last being seen in our clinic, the patient reports doing reasonably well. She has stable dyspnea but denies chest pain, palpitations, PND, orthopnea, nausea, vomiting, dizziness, syncope, edema, weight gain, or early satiety.  She has not had ICD shocks.   Device History: SJT CRTD implanted 2007 for ischemic cardiomyopathy, CHF, LBBB; gen change 2012 History of appropriate therapy: No History of AAD therapy: Yes - amiodarone for AF   Past Medical History  Diagnosis Date  . LBBB (left bundle branch block)   . Carotid stenosis     a. 10/19/2011 carotid duplex - Mild hard plaque bilaterally. Stable 40-59% bilateral ICA stenosis. Carotid US (04/2013):  Bilateral 40-59% ICA.  F/u 1 year  . Dyslipidemia   . Hypertension   . Chronic combined systolic and diastolic CHF (congestive heart failure)     a. EF as low as 25% in 2006;  b. EF 60-65% in 12/2011;  c. 02/2013 Echo: EF 35-40, mod mid-dist antsept HK, Gr 2 DD, mild LVH.  . Back pain, chronic     "just when I walk; mass on 3rd and 4th vertebrae right lower back"  . Cardiomyopathy, ischemic     a. 2012 s/p SJM 3231-40 Uni BiV ICD, ser # [id number].  Marland Kitchen CAD (coronary artery disease)     a. 05/2001 CABG x4: LIMA->LAD, VG->D1, VG->D2, VG->RCA.  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 CKD (chronic kidney disease), stage III   . Biventricular implantable cardiac defibrillator in situ 2007, 2012    a. 2007;  b. 2012 Gen change: SJM 3231-40 Uni BiV ICD, ser # [id number].    Marland Kitchen Hypoxemia requiring supplemental oxygen   . Hypothyroidism   . 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 Benay Spice, in remission  . OSA on CPAP   . Gout ~ 08/2011  . PAF (paroxysmal atrial fibrillation)     on Coumadin  . DM (diabetes mellitus), type 2 with complications     insulin dependent, retinopathy, neuropathy  . Mural thrombus of left ventricle     before 2003, while on Coumadin, No h/o CVA  . Morbid obesity with BMI of 45.0-49.9, adult     Ht. 5'4". BMI 47.2  . Vitamin D deficiency 06/09/2012    historical  . Hypercalcemia 09/26/2013  . Anemia, unspecified 12/13/2013   Past Surgical History  Procedure Laterality Date  . Cardiac catheterization  06/03/01  . Cholecystectomy  1970  . Tubal ligation  1972  . Abdominal hysterectomy  1982  . Insert / replace / remove pacemaker  2007; 2012    w/AICD  . Coronary artery bypass graft  2003    CABG X4  . Cataract extraction      left eye  . Cardioversion N/A 07/30/2014    Procedure: CARDIOVERSION;  Surgeon: Jolaine Artist, MD;  Location[facility] END[facility]COPY;  Service: Cardiovascular;  Laterality: N/A;  . Refractive surgery      right eye  . Cardioversion N/A 11/25/2014  Procedure: CARDIOVERSION;  Surgeon: Evans Lance, MD;  Location: [facility] ENDOSCOPY;  Service: Cardiovascular;  Laterality: N/A;    Current Outpatient Prescriptions  Medication Sig Dispense Refill  . acetaminophen (TYLENOL) 500 MG tablet Take 1,000 mg by mouth every 6 (six) hours as needed (pain).    Marland Kitchen albuterol (PROVENTIL HFA;VENTOLIN HFA) 108 (90 BASE) MCG/ACT inhaler Inhale 2 puffs into the lungs every 6 (six) hours as needed for wheezing or shortness of breath. 1 Inhaler 1  . amiodarone (PACERONE) 200 MG tablet Take 1 tablet (200 mg total) by mouth 2 (two) times daily. 180 tablet 1  . atorvastatin (LIPITOR) 20 MG tablet Take 1 tablet (20 mg total) by mouth daily. 90 tablet 1  . carvedilol (COREG) 25 MG tablet Take 2 tablets (50  mg total) by mouth 2 (two) times daily. 360 tablet 3  . escitalopram (LEXAPRO) 10 MG tablet Take 1 tablet (10 mg total) by mouth daily. 90 tablet 1  . fenofibrate (TRICOR) 48 MG tablet Take 1 tablet (48 mg total) by mouth daily. 90 tablet 3  . fluticasone (FLONASE) 50 MCG/ACT nasal spray Place 2 sprays into both nostrils daily as needed for allergies or rhinitis. As needed for nasal stuffiness. 16 g 0  . gabapentin (NEURONTIN) 300 MG capsule Take 1 capsule (300 mg total) by mouth 2 (two) times daily. 180 capsule 1  . glucose blood (FREESTYLE LITE) test strip DX: 250.60  Check sugars bid and as needed 100 each 2  . insulin NPH (HUMULIN N,NOVOLIN N) 100 UNIT/ML injection Inject 20-45 Units into the skin 2 (two) times daily before a meal. Take 45 units in the morning and 15 units in the evening    . insulin regular (NOVOLIN R,HUMULIN R) 100 units/mL injection Change to 15 units three times a with meals (to cover all meals--this is the same total amount but spread out) (Patient taking differently: Inject 15 Units into the skin 2 (two) times daily before a meal. Change to 15 units three times a with meals (to cover all meals--this is the same total amount but spread out)) 10 mL 12  . levothyroxine (SYNTHROID, LEVOTHROID) 25 MCG tablet Take 25 mcg by mouth daily before breakfast.    . LORazepam (ATIVAN) 0.5 MG tablet Take 1 tablet (0.5 mg total) by mouth at bedtime as needed for anxiety. 90 tablet 1  . NON FORMULARY Oxygen---24 hour    . torsemide (DEMADEX) 20 MG tablet Take 40mg  in morning and 20mg  in evening. May take an extra tab for 3# weight gain, swelling or SOB. Start taking from 11/29/2014 60 tablet 0  . Vitamin D, Ergocalciferol, (DRISDOL) 50000 UNITS CAPS capsule Take 50,000 Units by mouth every 7 (seven) days. Tuesdays    . warfarin (COUMADIN) 5 MG tablet Take 1 tablet (5 mg total) by mouth daily at 6 PM.     No current facility-administered medications for this visit.    Allergies:    Sulfonamide derivatives   Social History: History   Social History  . Marital Status: Married    Spouse Name: N/A  . Number of Children: Y  . Years of Education: N/A   Occupational History  . 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    Social History Main Topics  . Smoking status: Never Smoker   . Smokeless tobacco: Never Used  . Alcohol Use: No  . Drug Use: No  . Sexual Activity: Not on file     Comment: lives with  husband, no major dietary restrictions   Other Topics Concern  . Not on file   Social History Narrative   Lives in [place] with her husband.  She does not routinely exercise or adhere to any particular diet.      Family History: Family History  Problem Relation Age of Onset  . Kidney cancer Mother     kidney and female repo - died @ 22  . Asthma Mother   . Cancer Mother     gyn and renal  . Heart disease Mother     CAD  . Heart disease Father     died @ 63  . Stroke Father   . Diabetes Father   . Hypertension Father   . Hyperlipidemia Father   . Hypertension Son   . Hypertension Maternal Aunt   . Kidney cancer Maternal Uncle   . Cancer Maternal Uncle   . Cirrhosis Maternal Grandmother     non alcohol  . Cancer Maternal Grandfather   . Kidney cancer Maternal Grandfather   . Heart attack Father     Review of Systems: All other systems reviewed and are otherwise negative except as noted above.   Physical Exam: VS:  BP 120/60 mmHg  Pulse 71  Ht 5\' 4"  (1.626 m)  Wt 258 lb (117.028 kg)  BMI 44.26 kg/m2  SpO2 95% , BMI Body mass index is 44.26 kg/(m^2).  GEN- The patient is obese appearing in a wheelchair, alert and oriented x 3 today.   HEENT: normocephalic, atraumatic; sclera clear, conjunctiva pink; hearing intact; oropharynx clear; neck supple, no JVP Lymph- no cervical lymphadenopathy Lungs- Clear to ausculation bilaterally, normal work of breathing.  No wheezes, rales, rhonchi Heart- Regular rate and rhythm (paced) GI- obese,  non-tender, non-distended, bowel sounds present Extremities- no clubbing, cyanosis, +dependent edema; DP/PT/radial pulses 1+ bilaterally MS- no significant deformity or atrophy Skin- warm and dry, no rash or lesion; ICD pocket well healed Psych- euthymic mood, full affect Neuro- strength and sensation are intact  ICD interrogation- reviewed in detail today,  See PACEART report  EKG:  EKG is not ordered today.  Recent Labs: 09/26/2014: TSH 1.854 11/05/2014: ALT 18 11/26/2014: B Natriuretic Peptide 298.3* 11/28/2014: BUN 68*; Creatinine, Ser 3.11*; Hemoglobin 9.4*; Platelets 163; Potassium 3.8; Sodium 137   Wt Readings from Last 3 Encounters:  12/15/14 258 lb (117.028 kg)  12/01/14 255 lb 12.8 oz (116.03 kg)  11/28/14 257 lb 4.8 oz (116.711 kg)     Other studies Reviewed: Additional studies/ records that were reviewed today include: Dr Tanna Furry office notes, AHF notes, recent hospital records  Assessment and Plan:  1.  Chronic systolic dysfunction euvolemic today Stable on an appropriate medical regimen (no ACE-I or Spiro 2/2 CKD) Normal ICD function See Pace Art report No changes today  2.  Persistent atrial fibrillation Maintaining SR since DCCV last month Continue Warfarin for CHADS2VASC of at least 6 Decrease Amiodarone to 200mg  once daily today (discussed with Dr Rayann Heman)  3.  Obesity Weight loss encouraged  4.  OSA Continue CPAP - followed by pulmonary  5.  CAD s/p CABG No recent ischemic symptoms No ASA with Warfarin  6.  CKD stage III Followed by renal - Dr Marval Regal   Current medicines are reviewed at length with the patient today.   The patient does not have concerns regarding her medicines.  The following changes were made today:  Decrease Amiodarone to 200mg  daily  Labs/ tests ordered today include: none  Disposition:   Follow up with [facility] transmissions, HF clinic 02/2015 as scheduled, enrolled in [facility] clinic today, Dr Lovena Le in 1  year   Signed, Chanetta Marshall, NP 12/15/2014 3:06 PM  [place] [address] [place] [address] [phone] (office) [phone] (fax)

## 2014-12-24 DIAGNOSIS — I5043 Acute on chronic combined systolic (congestive) and diastolic (congestive) heart failure: Secondary | ICD-10-CM | POA: Diagnosis not present

## 2014-12-24 DIAGNOSIS — I4891 Unspecified atrial fibrillation: Secondary | ICD-10-CM | POA: Diagnosis not present

## 2014-12-24 DIAGNOSIS — I129 Hypertensive chronic kidney disease with stage 1 through stage 4 chronic kidney disease, or unspecified chronic kidney disease: Secondary | ICD-10-CM | POA: Diagnosis not present

## 2014-12-24 DIAGNOSIS — E1129 Type 2 diabetes mellitus with other diabetic kidney complication: Secondary | ICD-10-CM | POA: Diagnosis not present

## 2014-12-24 DIAGNOSIS — N183 Chronic kidney disease, stage 3 (moderate): Secondary | ICD-10-CM | POA: Diagnosis not present

## 2014-12-24 DIAGNOSIS — C8590 Non-Hodgkin lymphoma, unspecified, unspecified site: Secondary | ICD-10-CM | POA: Diagnosis not present

## 2014-12-27 ENCOUNTER — Ambulatory Visit (INDEPENDENT_AMBULATORY_CARE_PROVIDER_SITE_OTHER): Payer: Medicare Other | Admitting: Cardiology

## 2014-12-27 DIAGNOSIS — N183 Chronic kidney disease, stage 3 (moderate): Secondary | ICD-10-CM | POA: Diagnosis not present

## 2014-12-27 DIAGNOSIS — I4891 Unspecified atrial fibrillation: Secondary | ICD-10-CM | POA: Diagnosis not present

## 2014-12-27 DIAGNOSIS — I4819 Other persistent atrial fibrillation: Secondary | ICD-10-CM

## 2014-12-27 DIAGNOSIS — E1129 Type 2 diabetes mellitus with other diabetic kidney complication: Secondary | ICD-10-CM | POA: Diagnosis not present

## 2014-12-27 DIAGNOSIS — I5043 Acute on chronic combined systolic (congestive) and diastolic (congestive) heart failure: Secondary | ICD-10-CM | POA: Diagnosis not present

## 2014-12-27 DIAGNOSIS — C8590 Non-Hodgkin lymphoma, unspecified, unspecified site: Secondary | ICD-10-CM | POA: Diagnosis not present

## 2014-12-27 DIAGNOSIS — I129 Hypertensive chronic kidney disease with stage 1 through stage 4 chronic kidney disease, or unspecified chronic kidney disease: Secondary | ICD-10-CM | POA: Diagnosis not present

## 2014-12-27 DIAGNOSIS — Z5181 Encounter for therapeutic drug level monitoring: Secondary | ICD-10-CM

## 2014-12-27 DIAGNOSIS — I481 Persistent atrial fibrillation: Secondary | ICD-10-CM

## 2014-12-27 LAB — POCT INR: INR: 4.2

## 2014-12-29 ENCOUNTER — Telehealth: Payer: Self-pay | Admitting: Pulmonary Disease

## 2014-12-29 NOTE — Telephone Encounter (Signed)
Recommendations - 4L o2 blended into CPAP during sleep

## 2014-12-30 NOTE — Telephone Encounter (Signed)
Patient notified.  Nothing further needed. 

## 2015-01-04 ENCOUNTER — Ambulatory Visit (INDEPENDENT_AMBULATORY_CARE_PROVIDER_SITE_OTHER): Payer: Medicare Other | Admitting: *Deleted

## 2015-01-04 DIAGNOSIS — Z5181 Encounter for therapeutic drug level monitoring: Secondary | ICD-10-CM

## 2015-01-04 DIAGNOSIS — I639 Cerebral infarction, unspecified: Secondary | ICD-10-CM

## 2015-01-04 DIAGNOSIS — I4819 Other persistent atrial fibrillation: Secondary | ICD-10-CM

## 2015-01-04 DIAGNOSIS — I482 Chronic atrial fibrillation, unspecified: Secondary | ICD-10-CM

## 2015-01-04 DIAGNOSIS — I6350 Cerebral infarction due to unspecified occlusion or stenosis of unspecified cerebral artery: Secondary | ICD-10-CM

## 2015-01-04 DIAGNOSIS — I4891 Unspecified atrial fibrillation: Secondary | ICD-10-CM | POA: Diagnosis not present

## 2015-01-04 DIAGNOSIS — I481 Persistent atrial fibrillation: Secondary | ICD-10-CM

## 2015-01-04 LAB — POCT INR: INR: 2

## 2015-01-06 ENCOUNTER — Encounter: Payer: Self-pay | Admitting: Pulmonary Disease

## 2015-01-11 DIAGNOSIS — N179 Acute kidney failure, unspecified: Secondary | ICD-10-CM | POA: Diagnosis not present

## 2015-01-11 DIAGNOSIS — I129 Hypertensive chronic kidney disease with stage 1 through stage 4 chronic kidney disease, or unspecified chronic kidney disease: Secondary | ICD-10-CM | POA: Diagnosis not present

## 2015-01-11 DIAGNOSIS — N2581 Secondary hyperparathyroidism of renal origin: Secondary | ICD-10-CM | POA: Diagnosis not present

## 2015-01-11 DIAGNOSIS — D638 Anemia in other chronic diseases classified elsewhere: Secondary | ICD-10-CM | POA: Diagnosis not present

## 2015-01-11 DIAGNOSIS — N183 Chronic kidney disease, stage 3 (moderate): Secondary | ICD-10-CM | POA: Diagnosis not present

## 2015-01-11 DIAGNOSIS — E1129 Type 2 diabetes mellitus with other diabetic kidney complication: Secondary | ICD-10-CM | POA: Diagnosis not present

## 2015-01-11 LAB — BASIC METABOLIC PANEL
BUN: 60 mg/dL — AB (ref 4–21)
CREATININE: 2.7 mg/dL — AB (ref <=1.1)
Glucose: 52 mg/dL
POTASSIUM: 4.2 mmol/L (ref 3.4–5.3)
Sodium: 141 mmol/L (ref 137–147)

## 2015-01-14 ENCOUNTER — Encounter: Payer: Self-pay | Admitting: Family Medicine

## 2015-01-14 ENCOUNTER — Encounter: Payer: Self-pay | Admitting: Internal Medicine

## 2015-01-19 ENCOUNTER — Telehealth: Payer: Self-pay | Admitting: Cardiology

## 2015-01-19 ENCOUNTER — Ambulatory Visit (INDEPENDENT_AMBULATORY_CARE_PROVIDER_SITE_OTHER): Payer: Medicare Other

## 2015-01-19 ENCOUNTER — Ambulatory Visit (INDEPENDENT_AMBULATORY_CARE_PROVIDER_SITE_OTHER): Payer: Medicare Other | Admitting: *Deleted

## 2015-01-19 DIAGNOSIS — I639 Cerebral infarction, unspecified: Secondary | ICD-10-CM | POA: Diagnosis not present

## 2015-01-19 DIAGNOSIS — Z95810 Presence of automatic (implantable) cardiac defibrillator: Secondary | ICD-10-CM | POA: Diagnosis not present

## 2015-01-19 DIAGNOSIS — I5042 Chronic combined systolic (congestive) and diastolic (congestive) heart failure: Secondary | ICD-10-CM | POA: Diagnosis not present

## 2015-01-19 DIAGNOSIS — I4819 Other persistent atrial fibrillation: Secondary | ICD-10-CM

## 2015-01-19 DIAGNOSIS — Z5181 Encounter for therapeutic drug level monitoring: Secondary | ICD-10-CM

## 2015-01-19 DIAGNOSIS — I482 Chronic atrial fibrillation, unspecified: Secondary | ICD-10-CM

## 2015-01-19 DIAGNOSIS — I4891 Unspecified atrial fibrillation: Secondary | ICD-10-CM

## 2015-01-19 DIAGNOSIS — I6350 Cerebral infarction due to unspecified occlusion or stenosis of unspecified cerebral artery: Secondary | ICD-10-CM

## 2015-01-19 DIAGNOSIS — I481 Persistent atrial fibrillation: Secondary | ICD-10-CM

## 2015-01-19 LAB — POCT INR: INR: 3.2

## 2015-01-19 NOTE — Telephone Encounter (Signed)
Spoke with pt and reminded pt of remote transmission that is due today. Pt verbalized understanding.   

## 2015-01-24 NOTE — Progress Notes (Signed)
EPIC Encounter for ICM Monitoring  Patient Name: Destiny [name] is a 74 y.o. female Date: 01/24/2015 Primary Care Physican: Penni Homans, MD Primary Cardiologist: [place] ([facility]) Electrophysiologist: Lovena Le Dry Weight: 256.2 lbs       Bi-V Pacing: 98%  In the past month, have you:  1. Gained more than 2 pounds in a day or more than 5 pounds in a week? no  2. Had changes in your medications (with verification of current medications)? no  3. Had more shortness of breath than is usual for you? no  4. Limited your activity because of shortness of breath? no  5. Not been able to sleep because of shortness of breath? no  6. Had increased swelling in your feet or ankles? no  7. Had symptoms of dehydration (dizziness, dry mouth, increased thirst, decreased urine output) no  8. Had changes in sodium restriction? no  9. Been compliant with medication? Yes   ICM trend: 01/19/2015   Follow-up plan: ICM clinic phone appointment 02/24/2015.  1st encounter with patient for ICM follow up.  She reported she is doing well.  She weighs daily and limits salt intake as instructed.  Denied any HF symptoms.  Education given to call the should she experience any symptoms.  No changes today.   Copy of note sent to patient's primary care physician, primary cardiologist, and device following physician.  [name], RN, CCM 01/24/2015 9:16 AM

## 2015-01-26 ENCOUNTER — Other Ambulatory Visit: Payer: Self-pay | Admitting: Family Medicine

## 2015-01-26 NOTE — Telephone Encounter (Signed)
Pt states that she will be out before receiving torsemide from mail order. Only enough til Monday. Please send 30 day supply to [facility] at [facility] (not [facility]).

## 2015-01-26 NOTE — Telephone Encounter (Signed)
Please advise  torsemide (DEMADEX) 20 MG tablet MYERS, ISKRA M 60 tablet 0 ordered Edit  Summary: Take 40mg  in morning and 20mg  in evening. May take an extra tab for 3# weight gain, swelling or SOB. Start taking from 11/29/2014, Print Start: 11/29/2014  Ord/Sold: 11/28/2014 (O)     Patient Sig: Take 40mg  in morning and 20mg  in evening. May take an extra tab for 3# weight gain, swelling or SOB. Start taking from 11/29/2014   Ordered on: 11/28/2014   Admin Instructions: Take 40mg  in morning and 20mg  in evening. May take an extra tab for 3# weight gain, swelling or SOB. Start taking from 11/29/2014

## 2015-01-26 NOTE — Telephone Encounter (Signed)
OK to send a 30 day supply of Torsemide to local pharmacy at 20 mg tabs, 2 tabs po qam and 1 tab po qpm prn edema/sob/wt gain>3# in 24 hr, disp #90 with 1 rf

## 2015-01-28 MED ORDER — TORSEMIDE 20 MG PO TABS
20.0000 mg | ORAL_TABLET | Freq: Every day | ORAL | Status: DC
Start: 2015-01-28 — End: 2015-02-07

## 2015-01-28 NOTE — Telephone Encounter (Signed)
Pt notified rx sent.

## 2015-01-28 NOTE — Telephone Encounter (Signed)
Pt states that she contacted her local pharmacy and was told that the 30 day supply wasn't sent. Could you please confirm for pt.   CB: [phone]

## 2015-02-05 ENCOUNTER — Other Ambulatory Visit: Payer: Self-pay | Admitting: Family Medicine

## 2015-02-07 ENCOUNTER — Telehealth: Payer: Self-pay

## 2015-02-07 MED ORDER — TORSEMIDE 20 MG PO TABS: ORAL_TABLET | ORAL | Status: DC

## 2015-02-07 NOTE — Telephone Encounter (Signed)
Patient's spouse called in today. States that they have not been able to get his wife's medication due to issues surrounding the quantity. [employer] has been faxing paperwork to get the quantity changed but has been unable to get a response. Patient will run out Wednesday. After further discussion, Mr Riggenbach would like for Rx to be sent to [facility]. Rx'd torsemide (DEMADEX) 20 MG tablet.  Mr Nored will call back my direct number if there are any further issues getting this medication today. I will discuss this issue with PCP when she is available.

## 2015-02-09 ENCOUNTER — Ambulatory Visit (INDEPENDENT_AMBULATORY_CARE_PROVIDER_SITE_OTHER): Payer: Medicare Other | Admitting: *Deleted

## 2015-02-09 DIAGNOSIS — I482 Chronic atrial fibrillation, unspecified: Secondary | ICD-10-CM

## 2015-02-09 DIAGNOSIS — Z5181 Encounter for therapeutic drug level monitoring: Secondary | ICD-10-CM | POA: Diagnosis not present

## 2015-02-09 DIAGNOSIS — I4891 Unspecified atrial fibrillation: Secondary | ICD-10-CM | POA: Diagnosis not present

## 2015-02-09 DIAGNOSIS — I4819 Other persistent atrial fibrillation: Secondary | ICD-10-CM

## 2015-02-09 DIAGNOSIS — I481 Persistent atrial fibrillation: Secondary | ICD-10-CM

## 2015-02-09 DIAGNOSIS — I639 Cerebral infarction, unspecified: Secondary | ICD-10-CM | POA: Diagnosis not present

## 2015-02-09 DIAGNOSIS — I6350 Cerebral infarction due to unspecified occlusion or stenosis of unspecified cerebral artery: Secondary | ICD-10-CM

## 2015-02-09 LAB — POCT INR: INR: 2.3

## 2015-02-13 DIAGNOSIS — Z23 Encounter for immunization: Secondary | ICD-10-CM | POA: Diagnosis not present

## 2015-02-23 NOTE — Patient Outreach (Signed)
[place] [facility]) Care Management  02/23/2015  Destiny [name] 1941/03/19 [id number]   Referral from June, 2016 Heart Failure readmission report, assigned [name], RN to outreach for [place] Management services.  Thanks, [name]. [place], [place] Assistant Phone: [phone] Fax: [phone]

## 2015-02-24 ENCOUNTER — Ambulatory Visit (INDEPENDENT_AMBULATORY_CARE_PROVIDER_SITE_OTHER): Payer: Medicare Other

## 2015-02-24 DIAGNOSIS — Z95810 Presence of automatic (implantable) cardiac defibrillator: Secondary | ICD-10-CM

## 2015-02-24 DIAGNOSIS — I5042 Chronic combined systolic (congestive) and diastolic (congestive) heart failure: Secondary | ICD-10-CM | POA: Diagnosis not present

## 2015-02-25 NOTE — Progress Notes (Addendum)
EPIC Encounter for ICM Monitoring  Patient Name: Destiny [name] is a 74 y.o. female Date: 02/25/2015 Primary Care Physican: Penni Homans, MD Primary Cardiologist: Aundra Dubin Electrophysiologist: Lovena Le Dry Weight: 261.2 lb       In the past month, have you:  1. Gained more than 2 pounds in a day or more than 5 pounds in a week? No, but has gained 6 lbs in the last month.  2. Had changes in your medications (with verification of current medications)? no  3. Had more shortness of breath than is usual for you? no  4. Limited your activity because of shortness of breath? no  5. Not been able to sleep because of shortness of breath? no  6. Had increased swelling in your feet or ankles? no  7. Had symptoms of dehydration (dizziness, dry mouth, increased thirst, decreased urine output) no  8. Had changes in sodium restriction? no  9. Been compliant with medication? Yes   ICM trend:   Follow-up plan: ICM clinic phone appointment will be December 2016 due to patient will be calling CHF clinic to make a 3 month follow up.  [place] impedance slightly below baseline at time of transmission.  Destiny [name] stated Destiny [name] has gained  1 lb in last week and Destiny [name] took an extra Torsemide as prescribed 3 days ago. Destiny [name] stated if Destiny [name] still feels like Destiny [name] has some fluid Destiny [name] will take an extra one tomorrow but Destiny [name] said is urinating more today.  Destiny [name] denied any chest pain or  palpitations.    Copy of note sent to patient's primary care physician, primary cardiologist, and device following physician.  [name], RN, CCM 02/25/2015 12:22 PM   02/28/2015  Patient scheduled CHF clinic appointment for 03/15/2015.  Next home transmission scheduled for 04/13/2015.

## 2015-03-01 ENCOUNTER — Other Ambulatory Visit: Payer: Self-pay | Admitting: *Deleted

## 2015-03-01 NOTE — Patient Outreach (Signed)
[facility] outreach call for screening. Call to patient. Spoke with patient about [facility] program services. Patient reports she has several doctors and specialists that she sees and that she is "doing fine" and "I have no needs" Patient would welcome a packet of information for any future needs. RNCM unable to complete screening assessment per patient request. RNCM will mail out [facility] information packet and close screening. Royetta Crochet. Laymond Purser, RN, BSN, [place] [phone]

## 2015-03-03 ENCOUNTER — Ambulatory Visit (INDEPENDENT_AMBULATORY_CARE_PROVIDER_SITE_OTHER): Payer: Medicare Other | Admitting: Family Medicine

## 2015-03-03 ENCOUNTER — Encounter: Payer: Self-pay | Admitting: Family Medicine

## 2015-03-03 VITALS — BP 130/72 | HR 71 | Temp 98.0°F | Ht 64.0 in | Wt 263.0 lb

## 2015-03-03 DIAGNOSIS — E782 Mixed hyperlipidemia: Secondary | ICD-10-CM

## 2015-03-03 DIAGNOSIS — I6350 Cerebral infarction due to unspecified occlusion or stenosis of unspecified cerebral artery: Secondary | ICD-10-CM

## 2015-03-03 DIAGNOSIS — Z23 Encounter for immunization: Secondary | ICD-10-CM | POA: Diagnosis not present

## 2015-03-03 DIAGNOSIS — D649 Anemia, unspecified: Secondary | ICD-10-CM

## 2015-03-03 DIAGNOSIS — E559 Vitamin D deficiency, unspecified: Secondary | ICD-10-CM

## 2015-03-03 DIAGNOSIS — I4819 Other persistent atrial fibrillation: Secondary | ICD-10-CM

## 2015-03-03 DIAGNOSIS — I10 Essential (primary) hypertension: Secondary | ICD-10-CM

## 2015-03-03 DIAGNOSIS — E1122 Type 2 diabetes mellitus with diabetic chronic kidney disease: Secondary | ICD-10-CM

## 2015-03-03 DIAGNOSIS — I481 Persistent atrial fibrillation: Secondary | ICD-10-CM

## 2015-03-03 LAB — LIPID PANEL
Cholesterol: 161 mg/dL (ref 0–200)
HDL: 46 mg/dL (ref >39.00)
LDL Cholesterol: 78 mg/dL (ref 0–99)
NonHDL: 114.75
TRIGLYCERIDES: 185 mg/dL — AB (ref 0.0–149.0)
Total CHOL/HDL Ratio: 3
VLDL: 37 mg/dL (ref 0.0–40.0)

## 2015-03-03 LAB — CBC
HCT: 32.7 % — ABNORMAL LOW (ref 36.0–46.0)
HEMOGLOBIN: 10.6 g/dL — AB (ref 12.0–15.0)
MCHC: 32.4 g/dL (ref 30.0–36.0)
MCV: 90 fl (ref 78.0–100.0)
PLATELETS: 194 10*3/uL (ref 150.0–400.0)
RBC: 3.63 Mil/uL — AB (ref 3.87–5.11)
RDW: 15.3 % (ref 11.5–15.5)
WBC: 6.9 10*3/uL (ref 4.0–10.5)

## 2015-03-03 MED ORDER — TORSEMIDE 20 MG PO TABS: ORAL_TABLET | ORAL | Status: DC

## 2015-03-03 MED ORDER — GABAPENTIN 300 MG PO CAPS: 300.0000 mg | ORAL_CAPSULE | Freq: Two times a day (BID) | ORAL | Status: DC

## 2015-03-03 MED ORDER — ESCITALOPRAM OXALATE 10 MG PO TABS: 10.0000 mg | ORAL_TABLET | Freq: Every day | ORAL | Status: DC

## 2015-03-03 MED ORDER — LEVOTHYROXINE SODIUM 25 MCG PO TABS: 25.0000 ug | ORAL_TABLET | Freq: Every day | ORAL | Status: DC

## 2015-03-03 MED ORDER — LORAZEPAM 0.5 MG PO TABS: 0.5000 mg | ORAL_TABLET | Freq: Every evening | ORAL | Status: DC | PRN

## 2015-03-03 NOTE — Patient Instructions (Addendum)
Nasal saline and Flonase  Hypoglycemia Hypoglycemia occurs when the glucose in your blood is too low. Glucose is a type of sugar that is your body's main energy source. Hormones, such as insulin and glucagon, control the level of glucose in the blood. Insulin lowers blood glucose and glucagon increases blood glucose. Having too much insulin in your blood stream, or not eating enough food containing sugar, can result in hypoglycemia. Hypoglycemia can happen to people with or without diabetes. It can develop quickly and can be a medical emergency.  CAUSES   Missing or delaying meals.  Not eating enough carbohydrates at meals.  Taking too much diabetes medicine.  Not timing your oral diabetes medicine or insulin doses with meals, snacks, and exercise.  Nausea and vomiting.  Certain medicines.  Severe illnesses, such as hepatitis, kidney disorders, and certain eating disorders.  Increased activity or exercise without eating something extra or adjusting medicines.  Drinking too much alcohol.  A nerve disorder that affects body functions like your heart rate, blood pressure, and digestion (autonomic neuropathy).  A condition where the stomach muscles do not function properly (gastroparesis). Therefore, medicines and food may not absorb properly.  Rarely, a tumor of the pancreas can produce too much insulin. SYMPTOMS   Hunger.  Sweating (diaphoresis).  Change in body temperature.  Shakiness.  Headache.  Anxiety.  Lightheadedness.  Irritability.  Difficulty concentrating.  Dry mouth.  Tingling or numbness in the hands or feet.  Restless sleep or sleep disturbances.  Altered speech and coordination.  Change in mental status.  Seizures or prolonged convulsions.  Combativeness.  Drowsiness (lethargic).  Weakness.  Increased heart rate or palpitations.  Confusion.  Pale, gray skin color.  Blurred or double vision.  Fainting. DIAGNOSIS  A physical exam  and medical history will be performed. Your caregiver may make a diagnosis based on your symptoms. Blood tests and other lab tests may be performed to confirm a diagnosis. Once the diagnosis is made, your caregiver will see if your signs and symptoms go away once your blood glucose is raised.  TREATMENT  Usually, you can easily treat your hypoglycemia when you notice symptoms.  Check your blood glucose. If it is less than 70 mg/dl, take one of the following:   3-4 glucose tablets.    cup juice.    cup regular soda.   1 cup skim milk.   -1 tube of glucose gel.   5-6 hard candies.   Avoid high-fat drinks or food that may delay a rise in blood glucose levels.  Do not take more than the recommended amount of sugary foods, drinks, gel, or tablets. Doing so will cause your blood glucose to go too high.   Wait 10-15 minutes and recheck your blood glucose. If it is still less than 70 mg/dl or below your target range, repeat treatment.   Eat a snack if it is more than 1 hour until your next meal.  There may be a time when your blood glucose may go so low that you are unable to treat yourself at home when you start to notice symptoms. You may need someone to help you. You may even faint or be unable to swallow. If you cannot treat yourself, someone will need to bring you to the hospital.  Joes  If you have diabetes, follow your diabetes management plan by:  Taking your medicines as directed.  Following your exercise plan.  Following your meal plan. Do not skip meals. Eat on  time.  Testing your blood glucose regularly. Check your blood glucose before and after exercise. If you exercise longer or different than usual, be sure to check blood glucose more frequently.  Wearing your medical alert jewelry that says you have diabetes.  Identify the cause of your hypoglycemia. Then, develop ways to prevent the recurrence of hypoglycemia.  Do not take a hot bath  or shower right after an insulin shot.  Always carry treatment with you. Glucose tablets are the easiest to carry.  If you are going to drink alcohol, drink it only with meals.  Tell friends or family members ways to keep you safe during a seizure. This may include removing hard or sharp objects from the area or turning you on your side.  Maintain a healthy weight. SEEK MEDICAL CARE IF:   You are having problems keeping your blood glucose in your target range.  You are having frequent episodes of hypoglycemia.  You feel you might be having side effects from your medicines.  You are not sure why your blood glucose is dropping so low.  You notice a change in vision or a new problem with your vision. SEEK IMMEDIATE MEDICAL CARE IF:   Confusion develops.  A change in mental status occurs.  The inability to swallow develops.  Fainting occurs.   This information is not intended to replace advice given to you by your health care provider. Make sure you discuss any questions you have with your health care provider.   Document Released: 04/30/2005 Document Revised: 05/05/2013 Document Reviewed: 01/04/2015 Elsevier Interactive Patient Education [employer].

## 2015-03-03 NOTE — Progress Notes (Signed)
Pre visit review using our clinic review tool, if applicable. No additional management support is needed unless otherwise documented below in the visit note. 

## 2015-03-09 ENCOUNTER — Ambulatory Visit (INDEPENDENT_AMBULATORY_CARE_PROVIDER_SITE_OTHER): Payer: Medicare Other | Admitting: Pharmacist

## 2015-03-09 DIAGNOSIS — I6350 Cerebral infarction due to unspecified occlusion or stenosis of unspecified cerebral artery: Secondary | ICD-10-CM | POA: Diagnosis not present

## 2015-03-09 DIAGNOSIS — Z5181 Encounter for therapeutic drug level monitoring: Secondary | ICD-10-CM

## 2015-03-09 DIAGNOSIS — I482 Chronic atrial fibrillation, unspecified: Secondary | ICD-10-CM

## 2015-03-09 DIAGNOSIS — I4891 Unspecified atrial fibrillation: Secondary | ICD-10-CM | POA: Diagnosis not present

## 2015-03-09 DIAGNOSIS — I481 Persistent atrial fibrillation: Secondary | ICD-10-CM

## 2015-03-09 DIAGNOSIS — I4819 Other persistent atrial fibrillation: Secondary | ICD-10-CM

## 2015-03-09 LAB — POCT INR: INR: 3.2

## 2015-03-10 NOTE — Patient Outreach (Signed)
[name]) Care Management  03/10/2015  Destiny [name] [birth year] [id number]   Notification from [name], RN to close case due to patient refused [place] Management services.  Thanks, [name]. [place], [place] Assistant Phone: [phone] Fax: [phone]

## 2015-03-11 ENCOUNTER — Inpatient Hospital Stay (HOSPITAL_COMMUNITY)
Admission: EM | Admit: 2015-03-11 | Discharge: 2015-03-13 | DRG: 291 | Disposition: A | Discharge status: Home / Self Care | Payer: Medicare Other | Attending: Internal Medicine | Admitting: Internal Medicine

## 2015-03-11 ENCOUNTER — Emergency Department (HOSPITAL_COMMUNITY): Payer: Medicare Other

## 2015-03-11 ENCOUNTER — Encounter (HOSPITAL_COMMUNITY): Payer: Self-pay | Admitting: Emergency Medicine

## 2015-03-11 DIAGNOSIS — R0602 Shortness of breath: Secondary | ICD-10-CM | POA: Diagnosis not present

## 2015-03-11 DIAGNOSIS — I10 Essential (primary) hypertension: Secondary | ICD-10-CM | POA: Diagnosis present

## 2015-03-11 DIAGNOSIS — D649 Anemia, unspecified: Secondary | ICD-10-CM | POA: Diagnosis present

## 2015-03-11 DIAGNOSIS — I509 Heart failure, unspecified: Secondary | ICD-10-CM

## 2015-03-11 DIAGNOSIS — E662 Morbid (severe) obesity with alveolar hypoventilation: Secondary | ICD-10-CM | POA: Diagnosis present

## 2015-03-11 DIAGNOSIS — I255 Ischemic cardiomyopathy: Secondary | ICD-10-CM | POA: Diagnosis present

## 2015-03-11 DIAGNOSIS — Z9981 Dependence on supplemental oxygen: Secondary | ICD-10-CM

## 2015-03-11 DIAGNOSIS — I480 Paroxysmal atrial fibrillation: Secondary | ICD-10-CM | POA: Diagnosis not present

## 2015-03-11 DIAGNOSIS — Z8572 Personal history of non-Hodgkin lymphomas: Secondary | ICD-10-CM

## 2015-03-11 DIAGNOSIS — Z79899 Other long term (current) drug therapy: Secondary | ICD-10-CM

## 2015-03-11 DIAGNOSIS — M109 Gout, unspecified: Secondary | ICD-10-CM | POA: Diagnosis present

## 2015-03-11 DIAGNOSIS — J9621 Acute and chronic respiratory failure with hypoxia: Secondary | ICD-10-CM | POA: Diagnosis present

## 2015-03-11 DIAGNOSIS — Z95810 Presence of automatic (implantable) cardiac defibrillator: Secondary | ICD-10-CM

## 2015-03-11 DIAGNOSIS — E11319 Type 2 diabetes mellitus with unspecified diabetic retinopathy without macular edema: Secondary | ICD-10-CM | POA: Diagnosis present

## 2015-03-11 DIAGNOSIS — J449 Chronic obstructive pulmonary disease, unspecified: Secondary | ICD-10-CM | POA: Diagnosis present

## 2015-03-11 DIAGNOSIS — M545 Low back pain: Secondary | ICD-10-CM | POA: Diagnosis present

## 2015-03-11 DIAGNOSIS — N183 Chronic kidney disease, stage 3 unspecified: Secondary | ICD-10-CM | POA: Diagnosis present

## 2015-03-11 DIAGNOSIS — E1122 Type 2 diabetes mellitus with diabetic chronic kidney disease: Secondary | ICD-10-CM | POA: Diagnosis present

## 2015-03-11 DIAGNOSIS — Z6841 Body Mass Index (BMI) 40.0 and over, adult: Secondary | ICD-10-CM | POA: Diagnosis not present

## 2015-03-11 DIAGNOSIS — J9610 Chronic respiratory failure, unspecified whether with hypoxia or hypercapnia: Secondary | ICD-10-CM | POA: Diagnosis present

## 2015-03-11 DIAGNOSIS — E039 Hypothyroidism, unspecified: Secondary | ICD-10-CM | POA: Diagnosis present

## 2015-03-11 DIAGNOSIS — E8779 Other fluid overload: Secondary | ICD-10-CM | POA: Diagnosis not present

## 2015-03-11 DIAGNOSIS — E785 Hyperlipidemia, unspecified: Secondary | ICD-10-CM | POA: Diagnosis present

## 2015-03-11 DIAGNOSIS — I5043 Acute on chronic combined systolic (congestive) and diastolic (congestive) heart failure: Secondary | ICD-10-CM | POA: Diagnosis not present

## 2015-03-11 DIAGNOSIS — R0600 Dyspnea, unspecified: Secondary | ICD-10-CM | POA: Diagnosis not present

## 2015-03-11 DIAGNOSIS — Z7901 Long term (current) use of anticoagulants: Secondary | ICD-10-CM

## 2015-03-11 DIAGNOSIS — J9620 Acute and chronic respiratory failure, unspecified whether with hypoxia or hypercapnia: Secondary | ICD-10-CM | POA: Diagnosis present

## 2015-03-11 DIAGNOSIS — G4733 Obstructive sleep apnea (adult) (pediatric): Secondary | ICD-10-CM | POA: Diagnosis present

## 2015-03-11 DIAGNOSIS — I2510 Atherosclerotic heart disease of native coronary artery without angina pectoris: Secondary | ICD-10-CM | POA: Diagnosis present

## 2015-03-11 DIAGNOSIS — Z794 Long term (current) use of insulin: Secondary | ICD-10-CM

## 2015-03-11 DIAGNOSIS — Z951 Presence of aortocoronary bypass graft: Secondary | ICD-10-CM

## 2015-03-11 DIAGNOSIS — R0981 Nasal congestion: Secondary | ICD-10-CM | POA: Diagnosis not present

## 2015-03-11 DIAGNOSIS — G8929 Other chronic pain: Secondary | ICD-10-CM | POA: Diagnosis present

## 2015-03-11 HISTORY — DX: Presence of automatic (implantable) cardiac defibrillator: Z95.810

## 2015-03-11 HISTORY — DX: Presence of cardiac pacemaker: Z95.0

## 2015-03-11 LAB — BASIC METABOLIC PANEL
Anion gap: 9 (ref 5–15)
BUN: 46 mg/dL — AB (ref 6–20)
CO2: 30 mmol/L (ref 22–32)
Calcium: 9 mg/dL (ref 8.9–10.3)
Chloride: 98 mmol/L — ABNORMAL LOW (ref 101–111)
Creatinine, Ser: 2.86 mg/dL — ABNORMAL HIGH (ref 0.44–1.00)
GFR, EST AFRICAN AMERICAN: 18 mL/min — AB (ref >60)
GFR, EST NON AFRICAN AMERICAN: 15 mL/min — AB (ref >60)
Glucose, Bld: 242 mg/dL — ABNORMAL HIGH (ref 65–99)
POTASSIUM: 4.6 mmol/L (ref 3.5–5.1)
SODIUM: 137 mmol/L (ref 135–145)

## 2015-03-11 LAB — I-STAT TROPONIN, ED: TROPONIN I, POC: 0.03 ng/mL (ref 0.00–0.08)

## 2015-03-11 MED ORDER — DM-GUAIFENESIN ER 30-600 MG PO TB12
1.0000 | ORAL_TABLET | ORAL | Status: AC
  Administered 2015-03-12: 1 via ORAL
  Filled 2015-03-11: qty 1

## 2015-03-11 NOTE — ED Provider Notes (Signed)
CSN: [id number]     Arrival date & time 03/11/15  2322 History  By signing my name below, I, Emmanuella Mensah, attest that this documentation has been prepared under the direction and in the presence of Everlene Balls, MD. Electronically Signed: Judithann Sauger, ED Scribe. 03/11/2015. 11:50 PM.    Chief Complaint  Patient presents with  . Shortness of Breath   The history is provided by the patient. No language interpreter was used.   HPI Comments: Destiny [name] is a 74 y.o. female with a hx of BBB, HTN, CHF, CAD, and PAF who presents to the Emergency Department complaining of gradually worsening constant moderate nasal congestion onset 3-4 days ago. She reports associated rhinorrhea. She denies any cough or fevers. She explains that she took a nasal spray with mild relief but causes SOB. She denies any sick contacts. She states that she is normally on 2L of oxygen and tonight, her O2 stats was in the 80's and when she stood up to walk, her O2 stats dropped to the 70's and when she was placed on 3L, her O2 stats went up to approx. 93%. Pt has gained 3 pounds in 2 days despite taking an extra Torsemide.   Past Medical History  Diagnosis Date  . LBBB (left bundle branch block)   . Carotid stenosis     a. 10/19/2011 carotid duplex - Mild hard plaque bilaterally. Stable 40-59% bilateral ICA stenosis. Carotid US (04/2013):  Bilateral 40-59% ICA.  F/u 1 year  . Dyslipidemia   . Hypertension   . Chronic combined systolic and diastolic CHF (congestive heart failure) (HCC)     a. EF as low as 25% in 2006;  b. EF 60-65% in 12/2011;  c. 02/2013 Echo: EF 35-40, mod mid-dist antsept HK, Gr 2 DD, mild LVH.  . Back pain, chronic     "just when I walk; mass on 3rd and 4th vertebrae right lower back"  . Cardiomyopathy, ischemic     a. 2012 s/p SJM 3231-40 Uni BiV ICD, ser # [id number].  Marland Kitchen CAD (coronary artery disease)     a. 05/2001 CABG x4: LIMA->LAD, VG->D1, VG->D2, VG->RCA.  Marland Kitchen Retinopathy due to secondary  diabetes ([place])     type II, uncontrolled  . CKD (chronic kidney disease), stage III   . Biventricular implantable cardiac defibrillator in situ 2007, 2012    a. 2007;  b. 2012 Gen change: SJM 3231-40 Uni BiV ICD, ser # [id number].  Marland Kitchen Hypoxemia requiring supplemental oxygen   . Hypothyroidism   . 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 Benay Spice, in remission  . OSA on CPAP   . Gout ~ 08/2011  . PAF (paroxysmal atrial fibrillation) (HCC)     on Coumadin  . DM (diabetes mellitus), type 2 with complications (HCC)     insulin dependent, retinopathy, neuropathy  . Mural thrombus of left ventricle (HCC)     before 2003, while on Coumadin, No h/o CVA  . Morbid obesity with BMI of 45.0-49.9, adult (HCC)     Ht. 5'4". BMI 47.2  . Vitamin D deficiency 06/09/2012    historical  . Hypercalcemia 09/26/2013  . Anemia, unspecified 12/13/2013   Past Surgical History  Procedure Laterality Date  . Cardiac catheterization  06/03/01  . Cholecystectomy  1970  . Tubal ligation  1972  . Abdominal hysterectomy  1982  . Insert / replace / remove pacemaker  2007; 2012    w/AICD  .  Coronary artery bypass graft  2003    CABG X4  . Cataract extraction      left eye  . Cardioversion N/A 07/30/2014    Procedure: CARDIOVERSION;  Surgeon: Jolaine Artist, MD;  [facility];  Service: Cardiovascular;  Laterality: N/A;  . Refractive surgery      right eye  . Cardioversion N/A 11/25/2014    Procedure: CARDIOVERSION;  Surgeon: Evans Lance, MD;  Location[facility]COPY;  Service: Cardiovascular;  Laterality: N/A;   Family History  Problem Relation Age of Onset  . Kidney cancer Mother     kidney and female repo - died @ 98  . Asthma Mother   . Cancer Mother     gyn and renal  . Heart disease Mother     CAD  . Heart disease Father     died @ 36  . Stroke Father   . Diabetes Father   . Hypertension Father   . Hyperlipidemia Father   . Hypertension Son    . Hypertension Maternal Aunt   . Kidney cancer Maternal Uncle   . Cancer Maternal Uncle   . Cirrhosis Maternal Grandmother     non alcohol  . Cancer Maternal Grandfather   . Kidney cancer Maternal Grandfather   . Heart attack Father    Social History  Substance Use Topics  . Smoking status: Never Smoker   . Smokeless tobacco: Never Used  . Alcohol Use: No   OB History    No data available     Review of Systems  Constitutional: Negative for fever and chills.  HENT: Positive for congestion and rhinorrhea.   Respiratory: Positive for shortness of breath. Negative for cough.     A complete 10 system review of systems was obtained and all systems are negative except as noted in the HPI and PMH.    Allergies  Sulfonamide derivatives  Home Medications   Prior to Admission medications   Medication Sig Start Date End Date Taking? Authorizing Provider  acetaminophen (TYLENOL) 500 MG tablet Take 1,000 mg by mouth every 6 (six) hours as needed (pain).    Historical Provider, MD  albuterol (PROVENTIL HFA;VENTOLIN HFA) 108 (90 BASE) MCG/ACT inhaler Inhale 2 puffs into the lungs every 6 (six) hours as needed for wheezing or shortness of breath. 12/26/12   Mosie Lukes, MD  amiodarone (PACERONE) 200 MG tablet Take 1 tablet (200 mg total) by mouth daily. 12/15/14   Amber Sena Slate, NP  atorvastatin (LIPITOR) 20 MG tablet Take 1 tablet (20 mg total) by mouth daily. 11/30/14   Mosie Lukes, MD  carvedilol (COREG) 25 MG tablet Take 2 tablets (50 mg total) by mouth 2 (two) times daily. 11/30/14   Jolaine Artist, MD  escitalopram (LEXAPRO) 10 MG tablet Take 1 tablet (10 mg total) by mouth daily. 03/03/15   Mosie Lukes, MD  fenofibrate (TRICOR) 48 MG tablet Take 1 tablet (48 mg total) by mouth daily. 11/30/14   Jolaine Artist, MD  fluticasone (FLONASE) 50 MCG/ACT nasal spray Place 2 sprays into both nostrils daily as needed for allergies or rhinitis. As needed for nasal stuffiness. 01/13/14    Mosie Lukes, MD  gabapentin (NEURONTIN) 300 MG capsule Take 1 capsule (300 mg total) by mouth 2 (two) times daily. 03/03/15   Mosie Lukes, MD  glucose blood (FREESTYLE LITE) test strip DX: 250.60  Check sugars bid and as needed 05/22/13   Mosie Lukes, MD  insulin NPH (HUMULIN N,NOVOLIN N) 100 UNIT/ML injection Inject 20-45 Units into the skin 2 (two) times daily before a meal. Take 45 units in the morning and 15 units in the evening    Historical Provider, MD  insulin regular (NOVOLIN R,HUMULIN R) 100 units/mL injection Change to 15 units three times a with meals (to cover all meals--this is the same total amount but spread out) Patient taking differently: Inject 15 Units into the skin 2 (two) times daily before a meal. Change to 15 units three times a with meals (to cover all meals--this is the same total amount but spread out) 04/01/13   Ivan Anchors Love, PA-C  levothyroxine (SYNTHROID, LEVOTHROID) 25 MCG tablet Take 1 tablet (25 mcg total) by mouth daily before breakfast. 03/03/15   Mosie Lukes, MD  LORazepam (ATIVAN) 0.5 MG tablet Take 1 tablet (0.5 mg total) by mouth at bedtime as needed for anxiety. 03/03/15   Mosie Lukes, MD  NON FORMULARY Oxygen---24 hour    Historical Provider, MD  torsemide (DEMADEX) 20 MG tablet TAKE 2 TABLETS EVERY MORNING, TAKE 1 TABLET IN THE AFTERNOON AND MAY TAKE AN EXTRA TABLET WITH A 3 POUND WEIGHT GAIN. 03/03/15   Mosie Lukes, MD  Vitamin D, Ergocalciferol, (DRISDOL) 50000 UNITS CAPS capsule Take 50,000 Units by mouth every 7 (seven) days. Tuesdays    Historical Provider, MD  warfarin (COUMADIN) 5 MG tablet Take 1 tablet (5 mg total) by mouth daily at 6 PM. 10/29/14   Bonnielee Haff, MD   BP 136/67 mmHg  Pulse 70  Temp(Src) 99.2 F (37.3 C) (Oral)  Resp 24  Ht 5\' 4"  (1.626 m)  Wt 262 lb (118.842 kg)  BMI 44.95 kg/m2  SpO2 92% Physical Exam  Constitutional: She is oriented to person, place, and time. She appears well-developed and  well-nourished. No distress.  Obese female  HENT:  Head: Normocephalic and atraumatic.  Nose: Nose normal.  Mouth/Throat: Oropharynx is clear and moist. No oropharyngeal exudate.  Eyes: Conjunctivae and EOM are normal. Pupils are equal, round, and reactive to light. No scleral icterus.  Neck: Normal range of motion. Neck supple. No JVD present. No tracheal deviation present. No thyromegaly present.  Cardiovascular: Normal rate, regular rhythm and normal heart sounds.  Exam reveals no gallop and no friction rub.   No murmur heard. Pulmonary/Chest: Breath sounds normal. No respiratory distress. She has no wheezes. She exhibits no tenderness.  2L nasal  Tachypnea     Pacemaker in chest wall   Abdominal: Soft. Bowel sounds are normal. She exhibits no distension and no mass. There is no tenderness. There is no rebound and no guarding.  Musculoskeletal: Normal range of motion. She exhibits no edema or tenderness.  Lymphadenopathy:    She has no cervical adenopathy.  Neurological: She is alert and oriented to person, place, and time. No cranial nerve deficit. She exhibits normal muscle tone.  Skin: Skin is warm and dry. No rash noted. No erythema. No pallor.  Nursing note and vitals reviewed.   ED Course  Procedures (including critical care time) DIAGNOSTIC STUDIES: Oxygen Saturation is 92% on [place], low by my interpretation.    COORDINATION OF CARE: 11:46 PM- Pt advised of plan for treatment and pt agrees.    Labs Review Labs Reviewed  BASIC METABOLIC PANEL - Abnormal; Notable for the following:    Chloride 98 (*)    Glucose, Bld 242 (*)    BUN 46 (*)    Creatinine, Ser 2.86 (*)  GFR calc non Af Amer 15 (*)    GFR calc Af Amer 18 (*)    All other components within normal limits  CBC - Abnormal; Notable for the following:    RBC 3.49 (*)    Hemoglobin 10.4 (*)    HCT 32.5 (*)    RDW 15.6 (*)    All other components within normal limits  BRAIN NATRIURETIC PEPTIDE - Abnormal;  Notable for the following:    B Natriuretic Peptide 578.3 (*)    All other components within normal limits  PROTIME-INR - Abnormal; Notable for the following:    Prothrombin Time 23.4 (*)    INR 2.10 (*)    All other components within normal limits  I-STAT TROPOININ, ED    Imaging Review Dg Chest 2 View  03/12/2015  CLINICAL DATA:  Shortness of breath for 2 days.  Hypertension. EXAM: CHEST  2 VIEW COMPARISON:  November 26, 2014 FINDINGS: There is no edema or consolidation. There is cardiomegaly with pulmonary venous hypertension, stable. Pacemaker leads are attached to the right atrium and right ventricle. No adenopathy. No bone lesions. IMPRESSION: Pulmonary vascular congestion with cardiomegaly and pulmonary venous hypertension. No frank edema or consolidation. Electronically Signed   By: Lowella Grip III M.D.   On: 03/12/2015 00:12   Everlene Balls, MD has personally reviewed and evaluated these images and lab results as part of his medical decision-making.   EKG Interpretation   Date/Time:  Friday March 11 2015 23:45:02 EDT Ventricular Rate:  70 PR Interval:  72 QRS Duration: 168 QT Interval:  475 QTC Calculation: 513 R Axis:   -161 Text Interpretation:  A-V dual-paced rhythm with some inhibition No  significant change since last tracing Confirmed by Glynn Octave  [phone]) on 03/11/2015 11:57:19 PM      MDM   Final diagnoses:  None   Patient presents to the ED for SOB and rhinorrhea for several days.  She is compliant with all medications.  Her normal O2 sat is 93% on 2L [place].  She is currently >95% on 2L in the ED.  Lungs are clear.  Will perform SOB workup and give mucinex for congestion.   BNP has doubled, patient has gained 3 lbs in 2 days, CHF is likely the cause of her SOB and increased O2 requirement.  Will admit to hospitalist for further care and diuresis.    I personally performed the services described in this documentation, which was scribed in my  presence. The recorded information has been reviewed and is accurate.     Everlene Balls, MD 03/12/15 [phone]

## 2015-03-11 NOTE — ED Notes (Addendum)
Pt. reports shortness or breath with nasal congestion / runny nose onset 2 days ago unrelieved by MDI and nasal spray  , denies cough or fever .

## 2015-03-12 ENCOUNTER — Encounter (HOSPITAL_COMMUNITY): Payer: Self-pay | Admitting: Family Medicine

## 2015-03-12 DIAGNOSIS — E785 Hyperlipidemia, unspecified: Secondary | ICD-10-CM | POA: Diagnosis present

## 2015-03-12 DIAGNOSIS — G4733 Obstructive sleep apnea (adult) (pediatric): Secondary | ICD-10-CM

## 2015-03-12 DIAGNOSIS — R0600 Dyspnea, unspecified: Secondary | ICD-10-CM | POA: Diagnosis not present

## 2015-03-12 DIAGNOSIS — I255 Ischemic cardiomyopathy: Secondary | ICD-10-CM | POA: Diagnosis present

## 2015-03-12 DIAGNOSIS — Z95810 Presence of automatic (implantable) cardiac defibrillator: Secondary | ICD-10-CM | POA: Diagnosis not present

## 2015-03-12 DIAGNOSIS — Z794 Long term (current) use of insulin: Secondary | ICD-10-CM | POA: Diagnosis not present

## 2015-03-12 DIAGNOSIS — I10 Essential (primary) hypertension: Secondary | ICD-10-CM

## 2015-03-12 DIAGNOSIS — G8929 Other chronic pain: Secondary | ICD-10-CM | POA: Diagnosis present

## 2015-03-12 DIAGNOSIS — Z9981 Dependence on supplemental oxygen: Secondary | ICD-10-CM | POA: Diagnosis not present

## 2015-03-12 DIAGNOSIS — I5043 Acute on chronic combined systolic (congestive) and diastolic (congestive) heart failure: Secondary | ICD-10-CM | POA: Diagnosis not present

## 2015-03-12 DIAGNOSIS — Z6841 Body Mass Index (BMI) 40.0 and over, adult: Secondary | ICD-10-CM | POA: Diagnosis not present

## 2015-03-12 DIAGNOSIS — Z7901 Long term (current) use of anticoagulants: Secondary | ICD-10-CM | POA: Diagnosis not present

## 2015-03-12 DIAGNOSIS — E662 Morbid (severe) obesity with alveolar hypoventilation: Secondary | ICD-10-CM | POA: Diagnosis present

## 2015-03-12 DIAGNOSIS — R0602 Shortness of breath: Secondary | ICD-10-CM | POA: Diagnosis not present

## 2015-03-12 DIAGNOSIS — Z951 Presence of aortocoronary bypass graft: Secondary | ICD-10-CM | POA: Diagnosis not present

## 2015-03-12 DIAGNOSIS — E1122 Type 2 diabetes mellitus with diabetic chronic kidney disease: Secondary | ICD-10-CM | POA: Diagnosis present

## 2015-03-12 DIAGNOSIS — I509 Heart failure, unspecified: Secondary | ICD-10-CM

## 2015-03-12 DIAGNOSIS — N183 Chronic kidney disease, stage 3 (moderate): Secondary | ICD-10-CM | POA: Diagnosis present

## 2015-03-12 DIAGNOSIS — E11319 Type 2 diabetes mellitus with unspecified diabetic retinopathy without macular edema: Secondary | ICD-10-CM | POA: Diagnosis present

## 2015-03-12 DIAGNOSIS — J9621 Acute and chronic respiratory failure with hypoxia: Secondary | ICD-10-CM

## 2015-03-12 DIAGNOSIS — E038 Other specified hypothyroidism: Secondary | ICD-10-CM

## 2015-03-12 DIAGNOSIS — M109 Gout, unspecified: Secondary | ICD-10-CM | POA: Diagnosis present

## 2015-03-12 DIAGNOSIS — Z79899 Other long term (current) drug therapy: Secondary | ICD-10-CM | POA: Diagnosis not present

## 2015-03-12 DIAGNOSIS — I2510 Atherosclerotic heart disease of native coronary artery without angina pectoris: Secondary | ICD-10-CM | POA: Diagnosis present

## 2015-03-12 DIAGNOSIS — D649 Anemia, unspecified: Secondary | ICD-10-CM | POA: Diagnosis present

## 2015-03-12 DIAGNOSIS — E039 Hypothyroidism, unspecified: Secondary | ICD-10-CM | POA: Diagnosis present

## 2015-03-12 DIAGNOSIS — M545 Low back pain: Secondary | ICD-10-CM | POA: Diagnosis present

## 2015-03-12 DIAGNOSIS — I481 Persistent atrial fibrillation: Secondary | ICD-10-CM | POA: Diagnosis not present

## 2015-03-12 DIAGNOSIS — I480 Paroxysmal atrial fibrillation: Secondary | ICD-10-CM | POA: Diagnosis present

## 2015-03-12 DIAGNOSIS — J9620 Acute and chronic respiratory failure, unspecified whether with hypoxia or hypercapnia: Secondary | ICD-10-CM | POA: Diagnosis present

## 2015-03-12 DIAGNOSIS — Z8572 Personal history of non-Hodgkin lymphomas: Secondary | ICD-10-CM | POA: Diagnosis not present

## 2015-03-12 DIAGNOSIS — J449 Chronic obstructive pulmonary disease, unspecified: Secondary | ICD-10-CM | POA: Diagnosis present

## 2015-03-12 LAB — PROTIME-INR
INR: 2.1 — AB (ref 0.00–1.49)
Prothrombin Time: 23.4 seconds — ABNORMAL HIGH (ref 11.6–15.2)

## 2015-03-12 LAB — BASIC METABOLIC PANEL
ANION GAP: 7 (ref 5–15)
BUN: 43 mg/dL — ABNORMAL HIGH (ref 6–20)
CALCIUM: 8.5 mg/dL — AB (ref 8.9–10.3)
CO2: 30 mmol/L (ref 22–32)
Chloride: 97 mmol/L — ABNORMAL LOW (ref 101–111)
Creatinine, Ser: 2.7 mg/dL — ABNORMAL HIGH (ref 0.44–1.00)
GFR calc Af Amer: 19 mL/min — ABNORMAL LOW (ref >60)
GFR, EST NON AFRICAN AMERICAN: 17 mL/min — AB (ref >60)
Glucose, Bld: 140 mg/dL — ABNORMAL HIGH (ref 65–99)
POTASSIUM: 4.2 mmol/L (ref 3.5–5.1)
Sodium: 134 mmol/L — ABNORMAL LOW (ref 135–145)

## 2015-03-12 LAB — CBC
HEMATOCRIT: 30.1 % — AB (ref 36.0–46.0)
HEMATOCRIT: 32.5 % — AB (ref 36.0–46.0)
HEMOGLOBIN: 9.7 g/dL — AB (ref 12.0–15.0)
HEMOGLOBIN: 10.4 g/dL — AB (ref 12.0–15.0)
MCH: 29.8 pg (ref 26.0–34.0)
MCH: 29.8 pg (ref 26.0–34.0)
MCHC: 32 g/dL (ref 30.0–36.0)
MCHC: 32.2 g/dL (ref 30.0–36.0)
MCV: 92.6 fL (ref 78.0–100.0)
MCV: 93.1 fL (ref 78.0–100.0)
Platelets: 195 10*3/uL (ref 150–400)
Platelets: 242 10*3/uL (ref 150–400)
RBC: 3.25 MIL/uL — ABNORMAL LOW (ref 3.87–5.11)
RBC: 3.49 MIL/uL — AB (ref 3.87–5.11)
RDW: 15.5 % (ref 11.5–15.5)
RDW: 15.6 % — ABNORMAL HIGH (ref 11.5–15.5)
WBC: 9.4 10*3/uL (ref 4.0–10.5)
WBC: 10.3 10*3/uL (ref 4.0–10.5)

## 2015-03-12 LAB — GLUCOSE, CAPILLARY
GLUCOSE-CAPILLARY: 129 mg/dL — AB (ref 65–99)
GLUCOSE-CAPILLARY: 126 mg/dL — AB (ref 65–99)
GLUCOSE-CAPILLARY: 167 mg/dL — AB (ref 65–99)
Glucose-Capillary: 152 mg/dL — ABNORMAL HIGH (ref 65–99)
Glucose-Capillary: 188 mg/dL — ABNORMAL HIGH (ref 65–99)

## 2015-03-12 LAB — BRAIN NATRIURETIC PEPTIDE: B Natriuretic Peptide: 578.3 pg/mL — ABNORMAL HIGH (ref 0.0–100.0)

## 2015-03-12 MED ORDER — CARVEDILOL 25 MG PO TABS
50.0000 mg | ORAL_TABLET | Freq: Two times a day (BID) | ORAL | Status: DC
  Administered 2015-03-12 – 2015-03-13 (×3): 50 mg via ORAL
  Filled 2015-03-12 (×3): qty 2

## 2015-03-12 MED ORDER — IPRATROPIUM BROMIDE 0.02 % IN SOLN
RESPIRATORY_TRACT | Status: AC
  Administered 2015-03-12: 0.5 mg
  Filled 2015-03-12: qty 2.5

## 2015-03-12 MED ORDER — SODIUM CHLORIDE 0.9 % IJ SOLN
3.0000 mL | Freq: Two times a day (BID) | INTRAMUSCULAR | Status: DC
  Administered 2015-03-12 – 2015-03-13 (×4): 3 mL via INTRAVENOUS

## 2015-03-12 MED ORDER — INSULIN ASPART 100 UNIT/ML ~~LOC~~ SOLN
0.0000 [IU] | Freq: Three times a day (TID) | SUBCUTANEOUS | Status: DC
  Administered 2015-03-12: 4 [IU] via SUBCUTANEOUS
  Administered 2015-03-12: 3 [IU] via SUBCUTANEOUS
  Administered 2015-03-12 – 2015-03-13 (×3): 4 [IU] via SUBCUTANEOUS

## 2015-03-12 MED ORDER — ATORVASTATIN CALCIUM 20 MG PO TABS
20.0000 mg | ORAL_TABLET | Freq: Every day | ORAL | Status: DC
  Administered 2015-03-12 – 2015-03-13 (×2): 20 mg via ORAL
  Filled 2015-03-12 (×2): qty 1

## 2015-03-12 MED ORDER — FUROSEMIDE 10 MG/ML IJ SOLN
60.0000 mg | Freq: Once | INTRAMUSCULAR | Status: AC
  Administered 2015-03-12: 60 mg via INTRAVENOUS
  Filled 2015-03-12: qty 6

## 2015-03-12 MED ORDER — INSULIN GLARGINE 100 UNIT/ML ~~LOC~~ SOLN
20.0000 [IU] | Freq: Two times a day (BID) | SUBCUTANEOUS | Status: DC
  Administered 2015-03-12 – 2015-03-13 (×2): 20 [IU] via SUBCUTANEOUS
  Filled 2015-03-12 (×4): qty 0.2

## 2015-03-12 MED ORDER — WARFARIN - PHARMACIST DOSING INPATIENT: Freq: Every day | Status: DC

## 2015-03-12 MED ORDER — IPRATROPIUM-ALBUTEROL 0.5-2.5 (3) MG/3ML IN SOLN
3.0000 mL | Freq: Once | RESPIRATORY_TRACT | Status: DC
Start: 2015-03-12 — End: 2015-03-13

## 2015-03-12 MED ORDER — WARFARIN SODIUM 5 MG PO TABS
5.0000 mg | ORAL_TABLET | Freq: Once | ORAL | Status: AC
  Administered 2015-03-12: 5 mg via ORAL
  Filled 2015-03-12: qty 1

## 2015-03-12 MED ORDER — DM-GUAIFENESIN ER 30-600 MG PO TB12
1.0000 | ORAL_TABLET | Freq: Two times a day (BID) | ORAL | Status: DC
  Administered 2015-03-12 – 2015-03-13 (×3): 1 via ORAL
  Filled 2015-03-12 (×3): qty 1

## 2015-03-12 MED ORDER — ESCITALOPRAM OXALATE 10 MG PO TABS
10.0000 mg | ORAL_TABLET | Freq: Every day | ORAL | Status: DC
  Administered 2015-03-12 – 2015-03-13 (×2): 10 mg via ORAL
  Filled 2015-03-12 (×2): qty 1

## 2015-03-12 MED ORDER — LORAZEPAM 0.5 MG PO TABS
0.5000 mg | ORAL_TABLET | Freq: Every evening | ORAL | Status: DC | PRN
  Administered 2015-03-12: 0.5 mg via ORAL
  Filled 2015-03-12: qty 1

## 2015-03-12 MED ORDER — LEVOTHYROXINE SODIUM 25 MCG PO TABS
25.0000 ug | ORAL_TABLET | Freq: Every day | ORAL | Status: DC
Start: 2015-03-12 — End: 2015-03-13
  Administered 2015-03-12 – 2015-03-13 (×2): 25 ug via ORAL
  Filled 2015-03-12 (×2): qty 1

## 2015-03-12 MED ORDER — INSULIN ASPART 100 UNIT/ML ~~LOC~~ SOLN
0.0000 [IU] | Freq: Every day | SUBCUTANEOUS | Status: DC
Start: 2015-03-12 — End: 2015-03-13

## 2015-03-12 MED ORDER — AMIODARONE HCL 200 MG PO TABS
200.0000 mg | ORAL_TABLET | Freq: Every day | ORAL | Status: DC
  Administered 2015-03-12 – 2015-03-13 (×2): 200 mg via ORAL
  Filled 2015-03-12 (×2): qty 1

## 2015-03-12 MED ORDER — ALBUTEROL SULFATE (2.5 MG/3ML) 0.083% IN NEBU
2.5000 mg | INHALATION_SOLUTION | RESPIRATORY_TRACT | Status: DC | PRN
  Administered 2015-03-12: 2.5 mg via RESPIRATORY_TRACT

## 2015-03-12 MED ORDER — ALBUTEROL SULFATE (2.5 MG/3ML) 0.083% IN NEBU
INHALATION_SOLUTION | RESPIRATORY_TRACT | Status: AC
  Filled 2015-03-12: qty 3

## 2015-03-12 MED ORDER — FLUTICASONE PROPIONATE 50 MCG/ACT NA SUSP: 2.0000 | Freq: Every day | NASAL | Status: DC | PRN

## 2015-03-12 MED ORDER — FUROSEMIDE 10 MG/ML IJ SOLN
80.0000 mg | Freq: Once | INTRAMUSCULAR | Status: AC
  Administered 2015-03-12: 80 mg via INTRAVENOUS
  Filled 2015-03-12: qty 8

## 2015-03-12 MED ORDER — GABAPENTIN 300 MG PO CAPS
300.0000 mg | ORAL_CAPSULE | Freq: Two times a day (BID) | ORAL | Status: DC
  Administered 2015-03-12 – 2015-03-13 (×3): 300 mg via ORAL
  Filled 2015-03-12 (×3): qty 1

## 2015-03-12 MED ORDER — FENOFIBRATE 54 MG PO TABS
54.0000 mg | ORAL_TABLET | Freq: Every day | ORAL | Status: DC
  Administered 2015-03-12 – 2015-03-13 (×2): 54 mg via ORAL
  Filled 2015-03-12 (×2): qty 1

## 2015-03-12 MED ORDER — TORSEMIDE 20 MG PO TABS
40.0000 mg | ORAL_TABLET | Freq: Once | ORAL | Status: AC
  Administered 2015-03-12: 40 mg via ORAL
  Filled 2015-03-12: qty 2

## 2015-03-12 NOTE — H&P (Signed)
History and Physical  Patient Name: Destiny [name]     TMA:[id number]    DOB: 11-21-1940    DOA: 03/11/2015 Referring physician: Everlene Balls, MD PCP: Penni Homans, MD      Chief Complaint: Shortness of breath  HPI: Destiny [name] is a 74 y.o. female with a past medical history significant for A. fib on amiodarone and warfarin, IDDM, morbid obesity, HTN, ICD and pacemaker, OSA, CKD stage III, hypothyroidism, COPD on home oxygen, and chronic combined systolic and diastolic heart failure last EF25-30% in June who presents with dyspnea, weight gain, and URI symptoms.  The patient was in her normal state of health until a few days ago when she developed runny nose and congestion followed by increasing shortness of breath. She weighs herself daily and when she started to notice that she was gaining 2 or 3 pounds she increased her torsemide dose. Unfortunately by tonight, she was worse and noted that her oxygen level was dropping into the 80s on her home pulse oximeter so her husband brought her to the ER.  She endorses weight gain, orthopnea and hypoxia, but no leg swelling.  She endorses headache, sinus pressure, runny nose, but no wheezing, fever, or productive cough. She has not tried her home albuterol.  In the ED, the patient required 3 L of oxygen to maintain her saturations in the low 90s. She'll low grade fever and an elevated BNP. Chest x-ray showed chronic pulmonary congestion without frank edema. An ECG was normal. TRH were asked to admit for congestive heart failure.     Review of Systems:  All systems negative except as just noted or noted in the history of present illness.  Allergies  Allergen Reactions  . Sulfonamide Derivatives Other (See Comments)    Unknown; "childhood allergy/mother"    Prior to Admission medications   Medication Sig Start Date End Date Taking? Authorizing Provider  acetaminophen (TYLENOL) 500 MG tablet Take 1,000 mg by mouth every 6 (six) hours as  needed (pain).   Yes Historical Provider, MD  albuterol (PROVENTIL HFA;VENTOLIN HFA) 108 (90 BASE) MCG/ACT inhaler Inhale 2 puffs into the lungs every 6 (six) hours as needed for wheezing or shortness of breath. 12/26/12  Yes Mosie Lukes, MD  amiodarone (PACERONE) 200 MG tablet Take 1 tablet (200 mg total) by mouth daily. 12/15/14  Yes Amber Sena Slate, NP  atorvastatin (LIPITOR) 20 MG tablet Take 1 tablet (20 mg total) by mouth daily. 11/30/14  Yes Mosie Lukes, MD  carvedilol (COREG) 25 MG tablet Take 2 tablets (50 mg total) by mouth 2 (two) times daily. 11/30/14  Yes Shaune Pascal Bensimhon, MD  escitalopram (LEXAPRO) 10 MG tablet Take 1 tablet (10 mg total) by mouth daily. 03/03/15  Yes Mosie Lukes, MD  fenofibrate (TRICOR) 48 MG tablet Take 1 tablet (48 mg total) by mouth daily. 11/30/14  Yes Jolaine Artist, MD  fluticasone (FLONASE) 50 MCG/ACT nasal spray Place 2 sprays into both nostrils daily as needed for allergies or rhinitis. As needed for nasal stuffiness. 01/13/14  Yes Mosie Lukes, MD  gabapentin (NEURONTIN) 300 MG capsule Take 1 capsule (300 mg total) by mouth 2 (two) times daily. 03/03/15  Yes Mosie Lukes, MD  glucose blood (FREESTYLE LITE) test strip DX: 250.60  Check sugars bid and as needed 05/22/13  Yes Mosie Lukes, MD  insulin NPH (HUMULIN N,NOVOLIN N) 100 UNIT/ML injection Inject 20-45 Units into the skin 2 (two) times daily before a  meal. Take 45 units in the morning and 20 units in the evening   Yes Historical Provider, MD  insulin regular (NOVOLIN R,HUMULIN R) 100 units/mL injection Change to 15 units three times a with meals (to cover all meals--this is the same total amount but spread out) Patient taking differently: Inject 15 Units into the skin 2 (two) times daily before a meal. Change to 15 units three times a with meals (to cover all meals--this is the same total amount but spread out) 04/01/13  Yes Ivan Anchors Love, PA-C  levothyroxine (SYNTHROID, LEVOTHROID) 25 MCG  tablet Take 1 tablet (25 mcg total) by mouth daily before breakfast. 03/03/15  Yes Mosie Lukes, MD  LORazepam (ATIVAN) 0.5 MG tablet Take 1 tablet (0.5 mg total) by mouth at bedtime as needed for anxiety. Patient taking differently: Take 0.5 mg by mouth at bedtime.  03/03/15  Yes Mosie Lukes, MD  NON FORMULARY Oxygen---24 hour   Yes Historical Provider, MD  torsemide (DEMADEX) 20 MG tablet TAKE 2 TABLETS EVERY MORNING, TAKE 1 TABLET IN THE AFTERNOON AND MAY TAKE AN EXTRA TABLET WITH A 3 POUND WEIGHT GAIN. 03/03/15  Yes Mosie Lukes, MD  Vitamin D, Ergocalciferol, (DRISDOL) 50000 UNITS CAPS capsule Take 50,000 Units by mouth every 7 (seven) days. Tuesdays   Yes Historical Provider, MD  warfarin (COUMADIN) 5 MG tablet Take 1 tablet (5 mg total) by mouth daily at 6 PM. Patient taking differently: Take 2.5-5 mg by mouth daily at 6 PM. Take 5mg  everyday except 2.5mg  on Thursday. 10/29/14  Yes Bonnielee Haff, MD    Past Medical History  Diagnosis Date  . LBBB (left bundle branch block)   . Carotid stenosis     a. 10/19/2011 carotid duplex - Mild hard plaque bilaterally. Stable 40-59% bilateral ICA stenosis. Carotid US (04/2013):  Bilateral 40-59% ICA.  F/u 1 year  . Dyslipidemia   . Hypertension   . Chronic combined systolic and diastolic CHF (congestive heart failure) (HCC)     a. EF as low as 25% in 2006;  b. EF 60-65% in 12/2011;  c. 02/2013 Echo: EF 35-40, mod mid-dist antsept HK, Gr 2 DD, mild LVH.  . Back pain, chronic     "just when I walk; mass on 3rd and 4th vertebrae right lower back"  . Cardiomyopathy, ischemic     a. 2012 s/p SJM 3231-40 Uni BiV ICD, ser # [id number].  Marland Kitchen CAD (coronary artery disease)     a. 05/2001 CABG x4: LIMA->LAD, VG->D1, VG->D2, VG->RCA.  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 CKD (chronic kidney disease), stage III   . Biventricular implantable cardiac defibrillator in situ 2007, 2012    a. 2007;  b. 2012 Gen change: SJM  3231-40 Uni BiV ICD, ser # [id number].  Marland Kitchen Hypoxemia requiring supplemental oxygen   . Hypothyroidism   . 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 Benay Spice, in remission  . OSA on CPAP   . Gout ~ 08/2011  . PAF (paroxysmal atrial fibrillation) (HCC)     on Coumadin  . DM (diabetes mellitus), type 2 with complications (HCC)     insulin dependent, retinopathy, neuropathy  . Mural thrombus of left ventricle (HCC)     before 2003, while on Coumadin, No h/o CVA  . Morbid obesity with BMI of 45.0-49.9, adult (HCC)     Ht. 5'4". BMI 47.2  . Vitamin  D deficiency 06/09/2012    historical  . Hypercalcemia 09/26/2013  . Anemia, unspecified 12/13/2013  . Presence of permanent cardiac pacemaker   . AICD (automatic cardioverter/defibrillator) present     Past Surgical History  Procedure Laterality Date  . Cardiac catheterization  06/03/01  . Cholecystectomy  1970  . Tubal ligation  1972  . Abdominal hysterectomy  1982  . Insert / replace / remove pacemaker  2007; 2012    w/AICD  . Coronary artery bypass graft  2003    CABG X4  . Cataract extraction      left eye  . Cardioversion N/A 07/30/2014    Procedure: CARDIOVERSION;  Surgeon: Jolaine Artist, MD;  Location: [facility] ENDOSCOPY;  Service: Cardiovascular;  Laterality: N/A;  . Refractive surgery      right eye  . Cardioversion N/A 11/25/2014    Procedure: CARDIOVERSION;  Surgeon: Evans Lance, MD;  Location: [facility] ENDOSCOPY;  Service: Cardiovascular;  Laterality: N/A;    Family history: family history includes Asthma in her mother; Cancer in her maternal grandfather, maternal uncle, and mother; Cirrhosis in her maternal grandmother; Diabetes in her father; Heart attack in her father; Heart disease in her father and mother; Hyperlipidemia in her father; Hypertension in her father, maternal aunt, and son; Kidney cancer in her maternal grandfather, maternal uncle, and mother; Stroke in her father.  Social  History: Patient lives with her husband. She is a never smoker. She is normally ambulatory with her home O2 and independent with ADLs.       Physical Exam: BP 134/51 mmHg  Pulse 70  Temp(Src) 99.2 F (37.3 C) (Oral)  Resp 19  Ht 5\' 4"  (1.626 m)  Wt 118.842 kg (262 lb)  BMI 44.95 kg/m2  SpO2 95% General appearance: Obese adult female, alert and in no acute distress, breathless.   Eyes: Anicteric, conjunctiva pink, lids and lashes normal.     ENT: No nasal deformity, discharge, or epistaxis.  Nasal cannula in place.  OP moist without lesions.  Mallampati 4.  Skin: Warm and dry.  No suspicious rashes or lesions. Cardiac: RRR, heart sounds distant.  Capillary refill is brisk.  JVP not remotely visible.  No LE edema.   Respiratory: Speaks in short sentences, no tachypnea.  No rales.  I find wheezes on exam. Abdomen: Abdomen soft without rigidity.    Neuro: Sensorium intact and responding to questions, attention normal.  Speech is fluent.  Moves all extremities equally and with normal coordination.    Psych: Behavior appropriate.  Affect normal.  No evidence of aural or visual hallucinations or delusions.       Labs on Admission:  The metabolic panel shows the sodium, potassium, bicarbonate normal. There is hyperglycemia.  The serum creatinine is 2.86 from a baseline between 2.2 and 2.5 mg/dL. The BNP is 500 pg/mL, compared to her previous in July of 300 pg/mL. The INR is in the therapeutic range. The troponin is negative. The complete blood count shows shows chronic stable normocytic anemia, high normal WBC, normal platelets.   Radiological Exams on Admission: Personally reviewed: Dg Chest 2 View 03/12/2015 Pulmonary congestion and scant edema.    EKG: Independently reviewed. AV paced rhythm.    Assessment/Plan  1. Acute on chronic respiratory failure:  This is new.  I suspect that this is a combination of acute on chronic CHF as well as bronchospasm/COPD from a URI.  See  #2 and #3 below.    2. Chronic combined systolic and diastolic  CHF:  Last EF 25-30% this year. The patient has a pickwickian morphology. -Furosemide 60 mg IV once -Continue home torsemide -Strict ins and outs -Continue home beta blocker, statin and fenofibrate; the patient is not on an ACE/ARB due to CKD per Cards notes  3. COPD on home O2:  -Ipratropium albuterol once -Albuterol every 4 hours when necessary -Continue home nasal fluticasone -Mucinex  4. Atrial fibrillation:  CHADS2Vasc 6.   -Continue amiodarone and warfarin  5. OSA:  Stable.  -CPAP  6. Hypothyroidism:  Stable.  -Continue home levothyroxine  7. IDDM:  Stable. Last hemoglobin A1c well controlled at 7.2% in May. -Use glargine 20 units BID instead of NPH while inpatient -Novolog corrections       DVT PPx: Warfarin Diet: Cardiac, carb modified Code Status: Full Family Communication: The patient differential diagnosis, workup, and treatment plan were discussed with her husband at the bedside. CODE STATUS was confirmed.  Medical decision making: What exists of the patient's previous chart was reviewed in depth and the case was discussed with Dr. Claudine Mouton. Patient seen 1:57 AM on 03/12/2015.  Disposition Plan:  Admit for brief diuresis, bronchodilators. Reevaluate tomorrow. Dry weight 258 lbs.     Edwin Dada [facility] Pager [phone]

## 2015-03-12 NOTE — Progress Notes (Signed)
   03/12/15 0254  Vitals  Temp 100 F (37.8 C)  Temp Source Oral  BP (!) 130/100 mmHg  MAP (mmHg) 108  BP Location Right Arm  BP Method Automatic  Patient Position (if appropriate) Sitting  Pulse Rate 70  Resp 20  Oxygen Therapy  SpO2 96 %  O2 Device Nasal Cannula  O2 Flow Rate (L/min) 3 L/min  Height and Weight  Height 5\' 4"  (1.626 m)  Weight 119.296 kg (263 lb)  Type of Scale Used Bed (scale A)  Type of Weight Actual  BSA (Calculated - sq m) 2.32 sq meters  BMI (Calculated) 45.2  Weight in (lb) to have BMI = 25 145.3  admitted pt to rm 3E09 from ED, pt alert and oriented, denied pain at this time, oriented to room, call bell placed within reach, admission assessment done, orders carried out. Will monitor

## 2015-03-12 NOTE — Progress Notes (Signed)
Patient CPAP set up. Patient has her home unit. O2 bleed in of 3L. Patient states she does not need any assistance in putting on mask. RT will continue to monitor as needed.

## 2015-03-12 NOTE — Progress Notes (Signed)
ANTICOAGULATION CONSULT NOTE - Initial Consult  Pharmacy Consult for warfarin Indication: atrial fibrillation  Allergies  Allergen Reactions  . Sulfonamide Derivatives Other (See Comments)    Unknown; "childhood allergy/mother"    Patient Measurements: Height: 5\' 4"  (162.6 cm) Weight: 263 lb (119.296 kg) IBW/kg (Calculated) : 54.7 Heparin Dosing Weight:   Vital Signs: Temp: 100 F (37.8 C) (10/29 0254) Temp Source: Oral (10/29 0254) BP: 130/100 mmHg (10/29 0254) Pulse Rate: 70 (10/29 0254)  Labs:  Recent Labs  03/09/15 1002 03/11/15 2336 03/11/15 2355  HGB  --  10.4*  --   HCT  --  32.5*  --   PLT  --  242  --   LABPROT  --   --  23.4*  INR 3.2  --  2.10*  CREATININE  --  2.86*  --     Estimated Creatinine Clearance: 22.6 mL/min (by C-G formula based on Cr of 2.86).   Medical History: Past Medical History  Diagnosis Date  . LBBB (left bundle branch block)   . Carotid stenosis     a. 10/19/2011 carotid duplex - Mild hard plaque bilaterally. Stable 40-59% bilateral ICA stenosis. Carotid US (04/2013):  Bilateral 40-59% ICA.  F/u 1 year  . Dyslipidemia   . Hypertension   . Chronic combined systolic and diastolic CHF (congestive heart failure) (HCC)     a. EF as low as 25% in 2006;  b. EF 60-65% in 12/2011;  c. 02/2013 Echo: EF 35-40, mod mid-dist antsept HK, Gr 2 DD, mild LVH.  . Back pain, chronic     "just when I walk; mass on 3rd and 4th vertebrae right lower back"  . Cardiomyopathy, ischemic     a. 2012 s/p SJM 3231-40 Uni BiV ICD, ser # [id number].  Marland Kitchen CAD (coronary artery disease)     a. 05/2001 CABG x4: LIMA->LAD, VG->D1, VG->D2, VG->RCA.  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 CKD (chronic kidney disease), stage III   . Biventricular implantable cardiac defibrillator in situ 2007, 2012    a. 2007;  b. 2012 Gen change: SJM 3231-40 Uni BiV ICD, ser # [id number].  Marland Kitchen Hypoxemia requiring supplemental oxygen   . Hypothyroidism   .  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 Benay Spice, in remission  . OSA on CPAP   . Gout ~ 08/2011  . PAF (paroxysmal atrial fibrillation) (HCC)     on Coumadin  . DM (diabetes mellitus), type 2 with complications (HCC)     insulin dependent, retinopathy, neuropathy  . Mural thrombus of left ventricle (HCC)     before 2003, while on Coumadin, No h/o CVA  . Morbid obesity with BMI of 45.0-49.9, adult (HCC)     Ht. 5'4". BMI 47.2  . Vitamin D deficiency 06/09/2012    historical  . Hypercalcemia 09/26/2013  . Anemia, unspecified 12/13/2013  . Presence of permanent cardiac pacemaker   . AICD (automatic cardioverter/defibrillator) present     Medications:  Prescriptions prior to admission  Medication Sig Dispense Refill Last Dose  . acetaminophen (TYLENOL) 500 MG tablet Take 1,000 mg by mouth every 6 (six) hours as needed (pain).   unk  . albuterol (PROVENTIL HFA;VENTOLIN HFA) 108 (90 BASE) MCG/ACT inhaler Inhale 2 puffs into the lungs every 6 (six) hours as needed for wheezing or shortness of breath. 1 Inhaler 1 couple months  . amiodarone (PACERONE) 200 MG tablet Take  1 tablet (200 mg total) by mouth daily. 180 tablet 1 03/11/2015 at Unknown time  . atorvastatin (LIPITOR) 20 MG tablet Take 1 tablet (20 mg total) by mouth daily. 90 tablet 1 03/11/2015 at Unknown time  . carvedilol (COREG) 25 MG tablet Take 2 tablets (50 mg total) by mouth 2 (two) times daily. 360 tablet 3 03/11/2015 at 2000  . escitalopram (LEXAPRO) 10 MG tablet Take 1 tablet (10 mg total) by mouth daily. 90 tablet 1 03/11/2015 at Unknown time  . fenofibrate (TRICOR) 48 MG tablet Take 1 tablet (48 mg total) by mouth daily. 90 tablet 3 03/11/2015 at Unknown time  . fluticasone (FLONASE) 50 MCG/ACT nasal spray Place 2 sprays into both nostrils daily as needed for allergies or rhinitis. As needed for nasal stuffiness. 16 g 0 03/11/2015 at Unknown time  . gabapentin (NEURONTIN) 300 MG  capsule Take 1 capsule (300 mg total) by mouth 2 (two) times daily. 180 capsule 1 03/11/2015 at Unknown time  . glucose blood (FREESTYLE LITE) test strip DX: 250.60  Check sugars bid and as needed 100 each 2 03/11/2015 at Unknown time  . insulin NPH (HUMULIN N,NOVOLIN N) 100 UNIT/ML injection Inject 20-45 Units into the skin 2 (two) times daily before a meal. Take 45 units in the morning and 20 units in the evening   03/11/2015 at Unknown time  . insulin regular (NOVOLIN R,HUMULIN R) 100 units/mL injection Change to 15 units three times a with meals (to cover all meals--this is the same total amount but spread out) (Patient taking differently: Inject 15 Units into the skin 2 (two) times daily before a meal. Change to 15 units three times a with meals (to cover all meals--this is the same total amount but spread out)) 10 mL 12 03/11/2015 at am  . levothyroxine (SYNTHROID, LEVOTHROID) 25 MCG tablet Take 1 tablet (25 mcg total) by mouth daily before breakfast. 90 tablet 1 03/11/2015 at Unknown time  . LORazepam (ATIVAN) 0.5 MG tablet Take 1 tablet (0.5 mg total) by mouth at bedtime as needed for anxiety. (Patient taking differently: Take 0.5 mg by mouth at bedtime. ) 90 tablet 1 03/11/2015 at Unknown time  . NON FORMULARY Oxygen---24 hour   03/11/2015 at Unknown time  . torsemide (DEMADEX) 20 MG tablet TAKE 2 TABLETS EVERY MORNING, TAKE 1 TABLET IN THE AFTERNOON AND MAY TAKE AN EXTRA TABLET WITH A 3 POUND WEIGHT GAIN. 300 tablet 1 03/11/2015 at Unknown time  . Vitamin D, Ergocalciferol, (DRISDOL) 50000 UNITS CAPS capsule Take 50,000 Units by mouth every 7 (seven) days. Tuesdays   03/08/2015  . warfarin (COUMADIN) 5 MG tablet Take 1 tablet (5 mg total) by mouth daily at 6 PM. (Patient taking differently: Take 2.5-5 mg by mouth daily at 6 PM. Take 5mg  everyday except 2.5mg  on Thursday.)   03/11/2015 at Unknown time    Assessment: On warfarin for AFib PTA warfarin dose: 2.5 mg qThurs, 5 mg all other  days Last dose 10/28  Goal of Therapy:  INR 2-3 Monitor platelets by anticoagulation protocol: Yes    Plan:  Warfarin 5 mg po x1 Daily INR   Hughes Better, PharmD, BCPS Clinical Pharmacist Pager: [phone] 03/12/2015 3:05 AM

## 2015-03-12 NOTE — ED Notes (Signed)
Hospitalist at bedside with patient/family. 

## 2015-03-12 NOTE — Progress Notes (Signed)
[facility] PROGRESS NOTE   ZARINA PE BHA:[id number] DOB: 20-Aug-1940 DOA: 03/11/2015 PCP: Penni Homans, MD  HPI/Subjective: Seen with her husband at bedside, denies new complaints. Feeling much better than yesterday.  Assessment/Plan: Principal Problem:   Acute on chronic respiratory failure (HCC) Active Problems:   Diabetes mellitus type 2 with retinopathy ([place])   Essential hypertension   ATRIAL FIBRILLATION, PAROXYSMAL   Automatic implantable cardioverter-defibrillator in situ   OSA (obstructive sleep apnea)   CKD (chronic kidney disease) stage 3, GFR 30-59 ml/min   Hypothyroidism   Chronic combined systolic and diastolic heart failure (HCC)   Morbid obesity (HCC)   Chronic respiratory failure ([place])   CHF exacerbation ([place])   Patient seen and examined, database reviewed, this is no charge note patient admitted earlier today by my colleague Dr. Loleta Books. Admitted to the hospital with shortness of breath. Suspect mild acute on chronic systolic and diastolic CHF with some allergy component as she was wheezing. This is improved after giving breathing treatments and IV Lasix, get PT/OT and repeat Lasix today.  Code Status: Full Code Family Communication: Plan discussed with the patient. Disposition Plan: Remains inpatient Diet: Diet heart healthy/carb modified Room service appropriate?: Yes; Fluid consistency:: Thin  Consultants:  None  Procedures:  None  Antibiotics:  None (indicate start date, and stop date if known)   Objective: Filed Vitals:   03/12/15 1021  BP: 117/63  Pulse:   Temp: 98 F (36.7 C)  Resp: 20    Intake/Output Summary (Last 24 hours) at 03/12/15 1117 Last data filed at 03/12/15 1109  Gross per 24 hour  Intake    360 ml  Output   1350 ml  Net   -990 ml   Filed Weights   03/11/15 2330 03/12/15 0254  Weight: 118.842 kg (262 lb) 119.296 kg (263 lb)    Exam: General: Alert and awake, oriented x3, not in any acute  distress. HEENT: anicteric sclera, pupils reactive to light and accommodation, EOMI CVS: S1-S2 clear, no murmur rubs or gallops Chest: clear to auscultation bilaterally, no wheezing, rales or rhonchi Abdomen: soft nontender, nondistended, normal bowel sounds, no organomegaly Extremities: no cyanosis, clubbing or edema noted bilaterally Neuro: Cranial nerves II-XII intact, no focal neurological deficits  Data Reviewed: Basic Metabolic Panel:  Recent Labs Lab 03/11/15 2336 03/12/15 0338  NA 137 134*  K 4.6 4.2  CL 98* 97*  CO2 30 30  GLUCOSE 242* 140*  BUN 46* 43*  CREATININE 2.86* 2.70*  CALCIUM 9.0 8.5*   Liver Function Tests: No results for input(s): AST, ALT, ALKPHOS, BILITOT, PROT, ALBUMIN in the last 168 hours. No results for input(s): LIPASE, AMYLASE in the last 168 hours. No results for input(s): AMMONIA in the last 168 hours. CBC:  Recent Labs Lab 03/11/15 2336 03/12/15 0338  WBC 10.3 9.4  HGB 10.4* 9.7*  HCT 32.5* 30.1*  MCV 93.1 92.6  PLT 242 195   Cardiac Enzymes: No results for input(s): CKTOTAL, CKMB, CKMBINDEX, TROPONINI in the last 168 hours. BNP (last 3 results)  Recent Labs  10/24/14 2233 11/26/14 2236 03/11/15 2339  BNP 215.2* 298.3* 578.3*    ProBNP (last 3 results) No results for input(s): PROBNP in the last 8760 hours.  CBG:  Recent Labs Lab 03/12/15 0341 03/12/15 0523  GLUCAP 129* 126*    Micro No results found for this or any previous visit (from the past 240 hour(s)).   Studies: Dg Chest 2 View  03/12/2015  CLINICAL DATA:  Shortness  of breath for 2 days.  Hypertension. EXAM: CHEST  2 VIEW COMPARISON:  November 26, 2014 FINDINGS: There is no edema or consolidation. There is cardiomegaly with pulmonary venous hypertension, stable. Pacemaker leads are attached to the right atrium and right ventricle. No adenopathy. No bone lesions. IMPRESSION: Pulmonary vascular congestion with cardiomegaly and pulmonary venous hypertension. No  frank edema or consolidation. Electronically Signed   By: Lowella Grip III M.D.   On: 03/12/2015 00:12    Scheduled Meds: . albuterol      . amiodarone  200 mg Oral Daily  . atorvastatin  20 mg Oral Daily  . carvedilol  50 mg Oral BID  . dextromethorphan-guaiFENesin  1 tablet Oral BID  . escitalopram  10 mg Oral Daily  . fenofibrate  54 mg Oral Daily  . gabapentin  300 mg Oral BID  . insulin aspart  0-20 Units Subcutaneous TID WC  . insulin aspart  0-5 Units Subcutaneous QHS  . insulin glargine  20 Units Subcutaneous BID  . ipratropium-albuterol  3 mL Nebulization Once  . levothyroxine  25 mcg Oral QAC breakfast  . sodium chloride  3 mL Intravenous Q12H  . warfarin  5 mg Oral ONCE-1800  . Warfarin - Pharmacist Dosing Inpatient   Does not apply q1800   Continuous Infusions:      Time spent: 35 minutes    [facility] A  [facility] Pager [phone] If 7PM-7AM, please contact night-coverage at [email or web address], password [email or web address] 03/12/2015, 11:17 AM  LOS: 0 days

## 2015-03-13 ENCOUNTER — Encounter: Payer: Self-pay | Admitting: Family Medicine

## 2015-03-13 DIAGNOSIS — Z95810 Presence of automatic (implantable) cardiac defibrillator: Secondary | ICD-10-CM

## 2015-03-13 DIAGNOSIS — J9611 Chronic respiratory failure with hypoxia: Secondary | ICD-10-CM

## 2015-03-13 DIAGNOSIS — I481 Persistent atrial fibrillation: Secondary | ICD-10-CM

## 2015-03-13 DIAGNOSIS — N183 Chronic kidney disease, stage 3 (moderate): Secondary | ICD-10-CM

## 2015-03-13 DIAGNOSIS — Z794 Long term (current) use of insulin: Secondary | ICD-10-CM

## 2015-03-13 DIAGNOSIS — I5043 Acute on chronic combined systolic (congestive) and diastolic (congestive) heart failure: Principal | ICD-10-CM

## 2015-03-13 DIAGNOSIS — E11319 Type 2 diabetes mellitus with unspecified diabetic retinopathy without macular edema: Secondary | ICD-10-CM

## 2015-03-13 DIAGNOSIS — I5042 Chronic combined systolic (congestive) and diastolic (congestive) heart failure: Secondary | ICD-10-CM

## 2015-03-13 LAB — GLUCOSE, CAPILLARY
GLUCOSE-CAPILLARY: 166 mg/dL — AB (ref 65–99)
GLUCOSE-CAPILLARY: 199 mg/dL — AB (ref 65–99)

## 2015-03-13 LAB — PROTIME-INR
INR: 2.14 — ABNORMAL HIGH (ref 0.00–1.49)
Prothrombin Time: 23.8 seconds — ABNORMAL HIGH (ref 11.6–15.2)

## 2015-03-13 NOTE — Discharge Summary (Signed)
Physician Discharge Summary  [name] AYT:[id number] DOB: 12-11-1940 DOA: 03/11/2015  PCP: Penni Homans, MD  Admit date: 03/11/2015 Discharge date: 03/13/2015  Time spent: 40 minutes  Recommendations for Outpatient Follow-up:  1. Follow-up with primary care physician within one week.  Discharge Diagnoses:  Principal Problem:   Acute on chronic respiratory failure (HCC) Active Problems:   Diabetes mellitus type 2 with retinopathy ([place])   Essential hypertension   ATRIAL FIBRILLATION, PAROXYSMAL   Automatic implantable cardioverter-defibrillator in situ   OSA (obstructive sleep apnea)   CKD (chronic kidney disease) stage 3, GFR 30-59 ml/min   Hypothyroidism   Chronic combined systolic and diastolic heart failure (HCC)   Morbid obesity (HCC)   Chronic respiratory failure (HCC)   CHF exacerbation ([place])   Discharge Condition: Stable  Diet recommendation: Heart Healthy  Filed Weights   03/11/15 2330 03/12/15 0254 03/13/15 0401  Weight: 118.842 kg (262 lb) 119.296 kg (263 lb) 117.663 kg (259 lb 6.4 oz)    History of present illness:  Destiny [name] is a 74 y.o. female with a past medical history significant for A. fib on amiodarone and warfarin, IDDM, morbid obesity, HTN, ICD and pacemaker, OSA, CKD stage III, hypothyroidism, COPD on home oxygen, and chronic combined systolic and diastolic heart failure last EF25-30% in June who presents with dyspnea, weight gain, and URI symptoms.  The patient was in her normal state of health until a few days ago when she developed runny nose and congestion followed by increasing shortness of breath. She weighs herself daily and when she started to notice that she was gaining 2 or 3 pounds she increased her torsemide dose. Unfortunately by tonight, she was worse and noted that her oxygen level was dropping into the 80s on her home pulse oximeter so her husband brought her to the ER.  She endorses weight gain, orthopnea and hypoxia, but no  leg swelling. She endorses headache, sinus pressure, runny nose, but no wheezing, fever, or productive cough. She has not tried her home albuterol.  In the ED, the patient required 3 L of oxygen to maintain her saturations in the low 90s. She'll low grade fever and an elevated BNP. Chest x-ray showed chronic pulmonary congestion without frank edema. An ECG was normal. TRH were asked to admit for congestive heart failure.  Hospital Course:    1. Acute on chronic respiratory failure:  This is new. I suspect that this is a combination of acute on chronic CHF as well as bronchospasm/COPD from a URI.  See #2 and #3 below.   2. Chronic combined systolic and diastolic CHF:  Last EF 35-57% 10/20/2014. The patient has a pickwickian morphology. -Given 2 doses of 60 mg of furosemide, mild acute on chronic combined systolic/diastolic CHF, resolved. -Her home torsemide continued. -Weight down about 4 pounds. -Continue home beta blocker, statin and fenofibrate; the patient is not on an ACE/ARB due to CKD per Cards notes. -No changes done to medications.  3. COPD on home O2:  -No wheezing, sputum production or fever to suggest exacerbation. -Patient has seasonal allergies, said October is the worst months for her -Albuterol every 4 hours when necessary -Continue home nasal fluticasone -Patient improved after breathing treatments, to be discharged home without changes to medications. 4. Atrial fibrillation:  CHADS2Vasc 6.  -Continue amiodarone and warfarin  5. OSA:  Stable.  -CPAP  6. Hypothyroidism:  Stable.  -Continue home levothyroxine  7. IDDM:  Stable. Last hemoglobin A1c well controlled at 7.2% in  May. -Use glargine 20 units BID instead of NPH while inpatient -Novolog corrections     Procedures:  None  Consultations:  None  Discharge Exam: Filed Vitals:   03/13/15 1013  BP: 116/90  Pulse: 70  Temp: 98.1 F (36.7 C)  Resp: 20   General: Alert and awake,  oriented x3, not in any acute distress. HEENT: anicteric sclera, pupils reactive to light and accommodation, EOMI CVS: S1-S2 clear, no murmur rubs or gallops Chest: clear to auscultation bilaterally, no wheezing, rales or rhonchi Abdomen: soft nontender, nondistended, normal bowel sounds, no organomegaly Extremities: no cyanosis, clubbing or edema noted bilaterally Neuro: Cranial nerves II-XII intact, no focal neurological deficits   Discharge Instructions   Discharge Instructions    Diet - low sodium heart healthy    Complete by:  As directed      Increase activity slowly    Complete by:  As directed           Current Discharge Medication List    CONTINUE these medications which have NOT CHANGED   Details  acetaminophen (TYLENOL) 500 MG tablet Take 1,000 mg by mouth every 6 (six) hours as needed (pain).    albuterol (PROVENTIL HFA;VENTOLIN HFA) 108 (90 BASE) MCG/ACT inhaler Inhale 2 puffs into the lungs every 6 (six) hours as needed for wheezing or shortness of breath. Qty: 1 Inhaler, Refills: 1   Associated Diagnoses: Unspecified asthma(493.90)    amiodarone (PACERONE) 200 MG tablet Take 1 tablet (200 mg total) by mouth daily. Qty: 180 tablet, Refills: 1    atorvastatin (LIPITOR) 20 MG tablet Take 1 tablet (20 mg total) by mouth daily. Qty: 90 tablet, Refills: 1    carvedilol (COREG) 25 MG tablet Take 2 tablets (50 mg total) by mouth 2 (two) times daily. Qty: 360 tablet, Refills: 3   Associated Diagnoses: Chronic systolic heart failure (HCC)    escitalopram (LEXAPRO) 10 MG tablet Take 1 tablet (10 mg total) by mouth daily. Qty: 90 tablet, Refills: 1    fenofibrate (TRICOR) 48 MG tablet Take 1 tablet (48 mg total) by mouth daily. Qty: 90 tablet, Refills: 3   Associated Diagnoses: Chronic systolic heart failure (HCC)    fluticasone (FLONASE) 50 MCG/ACT nasal spray Place 2 sprays into both nostrils daily as needed for allergies or rhinitis. As needed for nasal  stuffiness. Qty: 16 g, Refills: 0    gabapentin (NEURONTIN) 300 MG capsule Take 1 capsule (300 mg total) by mouth 2 (two) times daily. Qty: 180 capsule, Refills: 1    glucose blood (FREESTYLE LITE) test strip DX: 250.60  Check sugars bid and as needed Qty: 100 each, Refills: 2   Associated Diagnoses: Diabetes ([place])    insulin NPH (HUMULIN N,NOVOLIN N) 100 UNIT/ML injection Inject 20-45 Units into the skin 2 (two) times daily before a meal. Take 45 units in the morning and 20 units in the evening    insulin regular (NOVOLIN R,HUMULIN R) 100 units/mL injection Change to 15 units three times a with meals (to cover all meals--this is the same total amount but spread out) Qty: 10 mL, Refills: 12    levothyroxine (SYNTHROID, LEVOTHROID) 25 MCG tablet Take 1 tablet (25 mcg total) by mouth daily before breakfast. Qty: 90 tablet, Refills: 1    LORazepam (ATIVAN) 0.5 MG tablet Take 1 tablet (0.5 mg total) by mouth at bedtime as needed for anxiety. Qty: 90 tablet, Refills: 1    NON FORMULARY Oxygen---24 hour    torsemide (DEMADEX) 20 MG  tablet TAKE 2 TABLETS EVERY MORNING, TAKE 1 TABLET IN THE AFTERNOON AND MAY TAKE AN EXTRA TABLET WITH A 3 POUND WEIGHT GAIN. Qty: 300 tablet, Refills: 1    Vitamin D, Ergocalciferol, (DRISDOL) 50000 UNITS CAPS capsule Take 50,000 Units by mouth every 7 (seven) days. Tuesdays    warfarin (COUMADIN) 5 MG tablet Take 1 tablet (5 mg total) by mouth daily at 6 PM.       Allergies  Allergen Reactions  . Sulfonamide Derivatives Other (See Comments)    Unknown; "childhood allergy/mother"      The results of significant diagnostics from this hospitalization (including imaging, microbiology, ancillary and laboratory) are listed below for reference.    Significant Diagnostic Studies: Dg Chest 2 View  03/12/2015  CLINICAL DATA:  Shortness of breath for 2 days.  Hypertension. EXAM: CHEST  2 VIEW COMPARISON:  November 26, 2014 FINDINGS: There is no edema or  consolidation. There is cardiomegaly with pulmonary venous hypertension, stable. Pacemaker leads are attached to the right atrium and right ventricle. No adenopathy. No bone lesions. IMPRESSION: Pulmonary vascular congestion with cardiomegaly and pulmonary venous hypertension. No frank edema or consolidation. Electronically Signed   By: Lowella Grip III M.D.   On: 03/12/2015 00:12    Microbiology: No results found for this or any previous visit (from the past 240 hour(s)).   Labs: Basic Metabolic Panel:  Recent Labs Lab 03/11/15 2336 03/12/15 0338  NA 137 134*  K 4.6 4.2  CL 98* 97*  CO2 30 30  GLUCOSE 242* 140*  BUN 46* 43*  CREATININE 2.86* 2.70*  CALCIUM 9.0 8.5*   Liver Function Tests: No results for input(s): AST, ALT, ALKPHOS, BILITOT, PROT, ALBUMIN in the last 168 hours. No results for input(s): LIPASE, AMYLASE in the last 168 hours. No results for input(s): AMMONIA in the last 168 hours. CBC:  Recent Labs Lab 03/11/15 2336 03/12/15 0338  WBC 10.3 9.4  HGB 10.4* 9.7*  HCT 32.5* 30.1*  MCV 93.1 92.6  PLT 242 195   Cardiac Enzymes: No results for input(s): CKTOTAL, CKMB, CKMBINDEX, TROPONINI in the last 168 hours. BNP: BNP (last 3 results)  Recent Labs  10/24/14 2233 11/26/14 2236 03/11/15 2339  BNP 215.2* 298.3* 578.3*    ProBNP (last 3 results) No results for input(s): PROBNP in the last 8760 hours.  CBG:  Recent Labs Lab 03/12/15 1115 03/12/15 1626 03/12/15 2102 03/13/15 0623 03/13/15 1101  GLUCAP 152* 188* 167* 166* 199*       Signed:  Umaiza Matusik A  [facility] 03/13/2015, 11:38 AM

## 2015-03-13 NOTE — Assessment & Plan Note (Addendum)
minimize simple carbs. Increase exercise as tolerated. Continue current meds. Is having episodes of hypoglycemia, she is reminded to not skip meals, always include a lean protein and minimize simple carbs.

## 2015-03-13 NOTE — Assessment & Plan Note (Signed)
Increase leafy greens, consider increased lean red meat and using cast iron cookware. Continue to monitor, report any concerns 

## 2015-03-13 NOTE — Assessment & Plan Note (Signed)
Tolerating hi dose vitamin d will continue to monitor

## 2015-03-13 NOTE — Assessment & Plan Note (Signed)
On Amiodarone following with cardiology

## 2015-03-13 NOTE — Progress Notes (Addendum)
Subjective:    [name], female    DOB: [birth year], 74 y.o.   MRN: [id number]  Chief Complaint  Patient presents with  . Follow-up    HPI Patient is in today for follow up. She is accompanied by her husband. She continues to struggle with fatigue, weakness and decreased balance. Notes some hip and back pain and has been struggling with increased falls. Recent hgba1c 6.2, no polyuria or polydipsia. No recent chest pain, palpitations.no fevers, chills. Mobility decreasing. No palpitationsfevers/GI or GU c/o. Taking meds as prescribed  Past Medical History  Diagnosis Date  . LBBB (left bundle branch block)   . Carotid stenosis     a. 10/19/2011 carotid duplex - Mild hard plaque bilaterally. Stable 40-59% bilateral ICA stenosis. Carotid US (04/2013):  Bilateral 40-59% ICA.  F/u 1 year  . Dyslipidemia   . Hypertension   . Chronic combined systolic and diastolic CHF (congestive heart failure) (HCC)     a. EF as low as 25% in 2006;  b. EF 60-65% in 12/2011;  c. 02/2013 Echo: EF 35-40, mod mid-dist antsept HK, Gr 2 DD, mild LVH.  . Back pain, chronic     "just when I walk; mass on 3rd and 4th vertebrae right lower back"  . Cardiomyopathy, ischemic     a. 2012 s/p SJM 3231-40 Uni BiV ICD, ser # [id number].  Marland Kitchen CAD (coronary artery disease)     a. 05/2001 CABG x4: LIMA->LAD, VG->D1, VG->D2, VG->RCA.  Marland Kitchen Retinopathy due to secondary diabetes ([place])     type II, uncontrolled  . CKD (chronic kidney disease), stage III   . Biventricular implantable cardiac defibrillator in situ 2007, 2012    a. 2007;  b. 2012 Gen change: SJM 3231-40 Uni BiV ICD, ser # [id number].  Marland Kitchen Hypoxemia requiring supplemental oxygen   . Hypothyroidism   . 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 Benay Spice, in remission  . OSA on CPAP   . Gout ~ 08/2011  . PAF (paroxysmal atrial fibrillation) (HCC)     on Coumadin  . DM (diabetes mellitus), type 2 with complications (HCC)       insulin dependent, retinopathy, neuropathy  . Mural thrombus of left ventricle (HCC)     before 2003, while on Coumadin, No h/o CVA  . Morbid obesity with BMI of 45.0-49.9, adult (HCC)     Ht. 5'4". BMI 47.2  . Vitamin D deficiency 06/09/2012    historical  . Hypercalcemia 09/26/2013  . Anemia, unspecified 12/13/2013  . Presence of permanent cardiac pacemaker   . AICD (automatic cardioverter/defibrillator) present     Past Surgical History  Procedure Laterality Date  . Cardiac catheterization  06/03/01  . Cholecystectomy  1970  . Tubal ligation  1972  . Abdominal hysterectomy  1982  . Insert / replace / remove pacemaker  2007; 2012    w/AICD  . Coronary artery bypass graft  2003    CABG X4  . Cataract extraction      left eye  . Cardioversion N/A 07/30/2014    Procedure: CARDIOVERSION;  Surgeon: Jolaine Artist, MD;  Location: [facility] ENDOSCOPY;  Service: Cardiovascular;  Laterality: N/A;  . Refractive surgery      right eye  . Cardioversion N/A 11/25/2014    Procedure: CARDIOVERSION;  Surgeon: Evans Lance, MD;  Location: [facility] ENDOSCOPY;  Service: Cardiovascular;  Laterality: N/A;    Family History  Problem Relation Age of  Onset  . Kidney cancer Mother     kidney and female repo - died @ 49  . Asthma Mother   . Cancer Mother     gyn and renal  . Heart disease Mother     CAD  . Heart disease Father     died @ 48  . Stroke Father   . Diabetes Father   . Hypertension Father   . Hyperlipidemia Father   . Hypertension Son   . Hypertension Maternal Aunt   . Kidney cancer Maternal Uncle   . Cancer Maternal Uncle   . Cirrhosis Maternal Grandmother     non alcohol  . Cancer Maternal Grandfather   . Kidney cancer Maternal Grandfather   . Heart attack Father     Social History   Social History  . Marital Status: Married    Spouse Name: N/A  . Number of Children: Y  . Years of Education: N/A   Occupational History  . 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    Social  History Main Topics  . Smoking status: Never Smoker   . Smokeless tobacco: Never Used  . Alcohol Use: No  . Drug Use: No  . Sexual Activity: Not on file     Comment: lives with husband, no major dietary restrictions   Other Topics Concern  . Not on file   Social History Narrative   Lives in [place] with her husband.  She does not routinely exercise or adhere to any particular diet.      Outpatient Prescriptions Prior to Visit  Medication Sig Dispense Refill  . acetaminophen (TYLENOL) 500 MG tablet Take 1,000 mg by mouth every 6 (six) hours as needed (pain).    Marland Kitchen albuterol (PROVENTIL HFA;VENTOLIN HFA) 108 (90 BASE) MCG/ACT inhaler Inhale 2 puffs into the lungs every 6 (six) hours as needed for wheezing or shortness of breath. 1 Inhaler 1  . amiodarone (PACERONE) 200 MG tablet Take 1 tablet (200 mg total) by mouth daily. 180 tablet 1  . atorvastatin (LIPITOR) 20 MG tablet Take 1 tablet (20 mg total) by mouth daily. 90 tablet 1  . carvedilol (COREG) 25 MG tablet Take 2 tablets (50 mg total) by mouth 2 (two) times daily. 360 tablet 3  . fenofibrate (TRICOR) 48 MG tablet Take 1 tablet (48 mg total) by mouth daily. 90 tablet 3  . fluticasone (FLONASE) 50 MCG/ACT nasal spray Place 2 sprays into both nostrils daily as needed for allergies or rhinitis. As needed for nasal stuffiness. 16 g 0  . glucose blood (FREESTYLE LITE) test strip DX: 250.60  Check sugars bid and as needed 100 each 2  . insulin NPH (HUMULIN N,NOVOLIN N) 100 UNIT/ML injection Inject 20-45 Units into the skin 2 (two) times daily before a meal. Take 45 units in the morning and 20 units in the evening    . insulin regular (NOVOLIN R,HUMULIN R) 100 units/mL injection Change to 15 units three times a with meals (to cover all meals--this is the same total amount but spread out) (Patient taking differently: Inject 15 Units into the skin 2 (two) times daily before a meal. Change to 15 units three times a with meals (to cover all  meals--this is the same total amount but spread out)) 10 mL 12  . NON FORMULARY Oxygen---24 hour    . Vitamin D, Ergocalciferol, (DRISDOL) 50000 UNITS CAPS capsule Take 50,000 Units by mouth every 7 (seven) days. Tuesdays    .  warfarin (COUMADIN) 5 MG tablet Take 1 tablet (5 mg total) by mouth daily at 6 PM. (Patient taking differently: Take 2.5-5 mg by mouth daily at 6 PM. Take 5mg  everyday except 2.5mg  on Thursday.)    . escitalopram (LEXAPRO) 10 MG tablet Take 1 tablet (10 mg total) by mouth daily. 90 tablet 1  . gabapentin (NEURONTIN) 300 MG capsule Take 1 capsule (300 mg total) by mouth 2 (two) times daily. 180 capsule 1  . levothyroxine (SYNTHROID, LEVOTHROID) 25 MCG tablet Take 25 mcg by mouth daily before breakfast.    . LORazepam (ATIVAN) 0.5 MG tablet Take 1 tablet (0.5 mg total) by mouth at bedtime as needed for anxiety. 90 tablet 1  . torsemide (DEMADEX) 20 MG tablet TAKE 1 TABLET EVERY DAY  AND  MAY  TAKE AN EXTRA TABLET FOR 3 LB WEIGHT GAIN, SWELLING, OR SHORTNESS OF BREATH (Patient not taking: Reported on 03/03/2015) 300 tablet 3   No facility-administered medications prior to visit.    Allergies  Allergen Reactions  . Sulfonamide Derivatives Other (See Comments)    Unknown; "childhood allergy/mother"    Review of Systems  Constitutional: Positive for malaise/fatigue. Negative for fever.  HENT: Negative for congestion.   Eyes: Negative for discharge.  Respiratory: Positive for shortness of breath.   Cardiovascular: Negative for chest pain, palpitations and leg swelling.  Gastrointestinal: Negative for nausea and abdominal pain.  Genitourinary: Negative for dysuria.  Musculoskeletal: Positive for myalgias, back pain and joint pain. Negative for falls.  Skin: Negative for rash.  Neurological: Positive for dizziness. Negative for loss of consciousness and headaches.  Endo/Heme/Allergies: Negative for environmental allergies.  Psychiatric/Behavioral: Negative for depression.  The patient is not nervous/anxious.        Objective:    Physical Exam  Constitutional: She is oriented to person, place, and time. She appears well-developed and well-nourished. No distress.  HENT:  Head: Normocephalic and atraumatic.  Nose: Nose normal.  Eyes: Right eye exhibits no discharge. Left eye exhibits no discharge.  Neck: Normal range of motion. Neck supple.  Cardiovascular: Normal rate.   Murmur heard. Irregularly irregular.  Pulmonary/Chest: Effort normal and breath sounds normal.  Abdominal: Soft. Bowel sounds are normal. There is no tenderness.  Musculoskeletal: She exhibits no edema.  Neurological: She is alert and oriented to person, place, and time.  Skin: Skin is warm and dry.  Psychiatric: She has a normal mood and affect.  Nursing note and vitals reviewed.   BP 130/72 mmHg  Pulse 71  Temp(Src) 98 F (36.7 C) (Oral)  Ht 5\' 4"  (1.626 m)  Wt 263 lb (119.296 kg)  BMI 45.12 kg/m2  SpO2 93% Wt Readings from Last 3 Encounters:  03/13/15 259 lb 6.4 oz (117.663 kg)  03/03/15 263 lb (119.296 kg)  12/15/14 258 lb (117.028 kg)     Lab Results  Component Value Date   WBC 9.4 03/12/2015   HGB 9.7* 03/12/2015   HCT 30.1* 03/12/2015   PLT 195 03/12/2015   GLUCOSE 140* 03/12/2015   CHOL 161 03/03/2015   TRIG 185.0* 03/03/2015   HDL 46.00 03/03/2015   LDLDIRECT 64.8 05/11/2014   LDLCALC 78 03/03/2015   ALT 18 11/05/2014   AST 22 11/05/2014   NA 134* 03/12/2015   K 4.2 03/12/2015   CL 97* 03/12/2015   CREATININE 2.70* 03/12/2015   BUN 43* 03/12/2015   CO2 30 03/12/2015   TSH 1.854 09/26/2014   INR 2.14* 03/13/2015   HGBA1C 7.2* 09/26/2014   MICROALBUR  77.0* 03/28/2011    Lab Results  Component Value Date   TSH 1.854 09/26/2014   Lab Results  Component Value Date   WBC 9.4 03/12/2015   HGB 9.7* 03/12/2015   HCT 30.1* 03/12/2015   MCV 92.6 03/12/2015   PLT 195 03/12/2015   Lab Results  Component Value Date   NA 134* 03/12/2015   K 4.2  03/12/2015   CO2 30 03/12/2015   GLUCOSE 140* 03/12/2015   BUN 43* 03/12/2015   CREATININE 2.70* 03/12/2015   BILITOT 0.4 11/05/2014   ALKPHOS 43 11/05/2014   AST 22 11/05/2014   ALT 18 11/05/2014   PROT 7.1 11/05/2014   ALBUMIN 3.9 11/05/2014   CALCIUM 8.5* 03/12/2015   ANIONGAP 7 03/12/2015   GFR 17.08* 11/23/2014   Lab Results  Component Value Date   CHOL 161 03/03/2015   Lab Results  Component Value Date   HDL 46.00 03/03/2015   Lab Results  Component Value Date   LDLCALC 78 03/03/2015   Lab Results  Component Value Date   TRIG 185.0* 03/03/2015   Lab Results  Component Value Date   CHOLHDL 3 03/03/2015   Lab Results  Component Value Date   HGBA1C 7.2* 09/26/2014       Assessment & Plan:   Problem List Items Addressed This Visit    Vitamin D deficiency    Tolerating hi dose vitamin d will continue to monitor      Type 2 diabetes mellitus, controlled, with renal complications ([place]) (Chronic)     minimize simple carbs. Increase exercise as tolerated. Continue current meds. Is having episodes of hypoglycemia, she is reminded to not skip meals, always include a lean protein and minimize simple carbs.      Essential hypertension    Well controlled, no changes to meds. Encouraged heart healthy diet such as the DASH diet and exercise as tolerated.       Relevant Medications   torsemide (DEMADEX) 20 MG tablet   ATRIAL FIBRILLATION, PAROXYSMAL    On Amiodarone following with cardiology      Relevant Medications   torsemide (DEMADEX) 20 MG tablet   Anemia - Primary    Increase leafy greens, consider increased lean red meat and using cast iron cookware. Continue to monitor, report any concerns      Relevant Orders   Lipid panel (Completed)   CBC (Completed)    Other Visit Diagnoses    Hyperlipidemia, mixed        Relevant Medications    torsemide (DEMADEX) 20 MG tablet    Other Relevant Orders    Lipid panel (Completed)    CBC (Completed)        I have changed Ms. Vanderzanden's levothyroxine. I am also having her maintain her albuterol, insulin NPH Human, insulin regular, glucose blood, NON FORMULARY, Vitamin D (Ergocalciferol), fluticasone, acetaminophen, warfarin, atorvastatin, fenofibrate, carvedilol, amiodarone, escitalopram, gabapentin, LORazepam, and torsemide.  Meds ordered this encounter  Medications  . DISCONTD: torsemide (DEMADEX) 20 MG tablet    Sig: TAKE 2 TABLETS EVERY MORNING, TAKE 1 TABLET IN THE AFTERNOON AND MAY TAKE AN EXTRA TABLET WITH A 3 POUND WEIGHT GAIN.  Marland Kitchen escitalopram (LEXAPRO) 10 MG tablet    Sig: Take 1 tablet (10 mg total) by mouth daily.    Dispense:  90 tablet    Refill:  1  . gabapentin (NEURONTIN) 300 MG capsule    Sig: Take 1 capsule (300 mg total) by mouth 2 (two) times daily.  Dispense:  180 capsule    Refill:  1  . levothyroxine (SYNTHROID, LEVOTHROID) 25 MCG tablet    Sig: Take 1 tablet (25 mcg total) by mouth daily before breakfast.    Dispense:  90 tablet    Refill:  1  . LORazepam (ATIVAN) 0.5 MG tablet    Sig: Take 1 tablet (0.5 mg total) by mouth at bedtime as needed for anxiety.    Dispense:  90 tablet    Refill:  1  . torsemide (DEMADEX) 20 MG tablet    Sig: TAKE 2 TABLETS EVERY MORNING, TAKE 1 TABLET IN THE AFTERNOON AND MAY TAKE AN EXTRA TABLET WITH A 3 POUND WEIGHT GAIN.    Dispense:  300 tablet    Refill:  1  Hypoxia: patient with persistent hypoxia and worsening symptoms. She is struggling with debility and requires portable oxygen to help with her limited mobility. She is now unable to negatiate the larger tanks she has previously used   Penni Homans, MD

## 2015-03-13 NOTE — Assessment & Plan Note (Signed)
Well controlled, no changes to meds. Encouraged heart healthy diet such as the DASH diet and exercise as tolerated.  °

## 2015-03-13 NOTE — Evaluation (Signed)
Physical Therapy Evaluation Patient Details Name: Destiny [name] MRN: [id number] DOB: [birth year] Today's Date: 03/13/2015   History of Present Illness  HPI: Destiny [name] is a 74 y.o. female with a past medical history significant for A. fib on amiodarone and warfarin, IDDM, morbid obesity, HTN, ICD and pacemaker, OSA, CKD stage III, hypothyroidism, COPD on home oxygen, and chronic combined systolic and diastolic heart failure last EF25-30% in June who presents with dyspnea, weight gain, and URI symptoms.  Clinical Impression   Pt admitted with above diagnosis. Pt currently with functional limitations due to the deficits listed below (see PT Problem List). Overall pt feels like she is back to baseline; we discussed getting HHPT, HHRN back out to the house, given this hospitalization -- reports she will think about it;  Pt will benefit from skilled PT to increase their independence and safety with mobility to allow discharge to the venue listed below.    She hopes to go home today, and is OK for dc home from PT standpoint; Will follow for Acute PT while in-hospital     Follow Up Recommendations Home health PT;Other (comment) [facility] for chronick disease management (active with [facility]))    Equipment Recommendations  None recommended by PT    Recommendations for Other Services       Precautions / Restrictions Precautions Precaution Comments: Cues to self-monitor for activity tolerance; desats with activity Restrictions Weight Bearing Restrictions: No      Mobility  Bed Mobility               General bed mobility comments: Pt sitting EOB upon entry  Transfers Overall transfer level: Needs assistance Equipment used: Rolling walker (2 wheeled) Transfers: Sit to/from Stand Sit to Stand: Supervision         General transfer comment: Cues for safety and hand placement  Ambulation/Gait Ambulation/Gait assistance: Supervision Ambulation Distance (Feet): 75 Feet Assistive  device: Rolling walker (2 wheeled) Gait Pattern/deviations: Step-through pattern Gait velocity: decr   General Gait Details: Cues to self-monitor for activity tolerance; Walked on 3 L O2 (at baseline she increases her O2 from 2-3 when she anticipates walking or more activity); O2 sats ranged 87-94% with walking  Stairs            Wheelchair Mobility    Modified Rankin (Stroke Patients Only)       Balance                                             Pertinent Vitals/Pain Pain Assessment: No/denies pain    Home Living Family/patient expects to be discharged to:: Private residence Living Arrangements: Spouse/significant other Available Help at Discharge: Family;Available 24 hours/day Type of Home: House Home Access: Ramped entrance     Home Layout: One level Home Equipment: Walker - 4 wheels;Grab bars - tub/shower;Hand held shower head;Shower seat;Electric scooter      Prior Function Level of Independence: Independent with assistive device(s)         Comments: 4 wheeled walker in house, scooter outside of house     Hand Dominance        Extremity/Trunk Assessment   Upper Extremity Assessment: Overall WFL for tasks assessed           Lower Extremity Assessment: Generalized weakness         Communication   Communication: No difficulties  Cognition Arousal/Alertness:  Awake/alert Behavior During Therapy: WFL for tasks assessed/performed Overall Cognitive Status: Within Functional Limits for tasks assessed                      General Comments General comments (skin integrity, edema, etc.): Pt feels she is back to baseline    Exercises        Assessment/Plan    PT Assessment Patient needs continued PT services  PT Diagnosis Difficulty walking   PT Problem List Decreased strength;Decreased activity tolerance;Cardiopulmonary status limiting activity  PT Treatment Interventions DME instruction;Gait  training;Functional mobility training;Therapeutic activities;Therapeutic exercise;Patient/family education   PT Goals (Current goals can be found in the Care Plan section) Acute Rehab PT Goals Patient Stated Goal: Hopes to get home soon PT Goal Formulation: With patient Time For Goal Achievement: 03/20/15 Potential to Achieve Goals: Good    Frequency Min 3X/week   Barriers to discharge        Co-evaluation               End of Session Equipment Utilized During Treatment: Oxygen Activity Tolerance: Patient tolerated treatment well Patient left: in bed;with call bell/phone within reach;with family/visitor present (sitting EOB) Nurse Communication: Mobility status         Time: 5956-3875 PT Time Calculation (min) (ACUTE ONLY): 23 min   Charges:   PT Evaluation $Initial PT Evaluation Tier I: 1 Procedure PT Treatments $Gait Training: 8-22 mins   PT G CodesQuin Hoop 03/13/2015, 9:14 AM  Roney Marion, [place] Pager [phone] Office [phone]

## 2015-03-14 ENCOUNTER — Telehealth: Payer: Self-pay

## 2015-03-14 NOTE — Telephone Encounter (Signed)
PCP: Penni Homans, MD  Admit date: 03/11/2015 Discharge date: 03/13/2015  Recommendations for Outpatient Follow-up:  1. Follow-up with primary care physician within one week.  Discharge Diagnoses:  Principal Problem:  Acute on chronic respiratory failure (HCC)  Pt states that she's feeling better.  However, sats drop to 82% when standing or with exertion.  Sats increases back to 95% or so with rest.   Denies shortness of breath, wheezing, weight gain, swelling, chest pain, fever, weakness or fatigue.  Wears O2 @ 2LNC.  CPAP at night.  Weighing daily.  Weight this morning 260.4lbs.    She declines a follow up visit with Dr. Charlett Blake at this time.  Stating she doesn't see a reason why she needs to come in.  When importance was discussed, she declined again, stating she has an appointment with Dr. Aundra Dubin tomorrow and Dr. Chalmers Cater on November 9th.  She says that if she has any problems, she would call the office.  Danger symptoms reviewed with patient that would prompt her to return back to the ER.  She stated understanding and agreed to comply.    Message routed to PCP for FYI.

## 2015-03-15 ENCOUNTER — Ambulatory Visit (HOSPITAL_COMMUNITY)
Admission: RE | Admit: 2015-03-15 | Discharge: 2015-03-15 | Disposition: A | Discharge status: Home / Self Care | Payer: Medicare Other | Source: Ambulatory Visit | Attending: Cardiology | Admitting: Cardiology

## 2015-03-15 ENCOUNTER — Encounter (HOSPITAL_COMMUNITY): Payer: Self-pay

## 2015-03-15 DIAGNOSIS — E785 Hyperlipidemia, unspecified: Secondary | ICD-10-CM | POA: Diagnosis not present

## 2015-03-15 DIAGNOSIS — Z79899 Other long term (current) drug therapy: Secondary | ICD-10-CM | POA: Insufficient documentation

## 2015-03-15 DIAGNOSIS — E1140 Type 2 diabetes mellitus with diabetic neuropathy, unspecified: Secondary | ICD-10-CM | POA: Diagnosis not present

## 2015-03-15 DIAGNOSIS — C8595 Non-Hodgkin lymphoma, unspecified, lymph nodes of inguinal region and lower limb: Secondary | ICD-10-CM | POA: Insufficient documentation

## 2015-03-15 DIAGNOSIS — E1122 Type 2 diabetes mellitus with diabetic chronic kidney disease: Secondary | ICD-10-CM | POA: Insufficient documentation

## 2015-03-15 DIAGNOSIS — E039 Hypothyroidism, unspecified: Secondary | ICD-10-CM | POA: Diagnosis not present

## 2015-03-15 DIAGNOSIS — Z95810 Presence of automatic (implantable) cardiac defibrillator: Secondary | ICD-10-CM | POA: Insufficient documentation

## 2015-03-15 DIAGNOSIS — I5042 Chronic combined systolic (congestive) and diastolic (congestive) heart failure: Secondary | ICD-10-CM | POA: Diagnosis not present

## 2015-03-15 DIAGNOSIS — J9611 Chronic respiratory failure with hypoxia: Secondary | ICD-10-CM | POA: Diagnosis not present

## 2015-03-15 DIAGNOSIS — I482 Chronic atrial fibrillation: Secondary | ICD-10-CM | POA: Insufficient documentation

## 2015-03-15 DIAGNOSIS — Z6841 Body Mass Index (BMI) 40.0 and over, adult: Secondary | ICD-10-CM | POA: Diagnosis not present

## 2015-03-15 DIAGNOSIS — I480 Paroxysmal atrial fibrillation: Secondary | ICD-10-CM

## 2015-03-15 DIAGNOSIS — I255 Ischemic cardiomyopathy: Secondary | ICD-10-CM | POA: Insufficient documentation

## 2015-03-15 DIAGNOSIS — E11319 Type 2 diabetes mellitus with unspecified diabetic retinopathy without macular edema: Secondary | ICD-10-CM | POA: Diagnosis not present

## 2015-03-15 DIAGNOSIS — N183 Chronic kidney disease, stage 3 (moderate): Secondary | ICD-10-CM | POA: Diagnosis not present

## 2015-03-15 DIAGNOSIS — Z951 Presence of aortocoronary bypass graft: Secondary | ICD-10-CM | POA: Insufficient documentation

## 2015-03-15 DIAGNOSIS — I6523 Occlusion and stenosis of bilateral carotid arteries: Secondary | ICD-10-CM | POA: Diagnosis not present

## 2015-03-15 DIAGNOSIS — Z9981 Dependence on supplemental oxygen: Secondary | ICD-10-CM | POA: Insufficient documentation

## 2015-03-15 DIAGNOSIS — I2510 Atherosclerotic heart disease of native coronary artery without angina pectoris: Secondary | ICD-10-CM | POA: Diagnosis not present

## 2015-03-15 DIAGNOSIS — I5022 Chronic systolic (congestive) heart failure: Secondary | ICD-10-CM | POA: Diagnosis not present

## 2015-03-15 DIAGNOSIS — Z7901 Long term (current) use of anticoagulants: Secondary | ICD-10-CM | POA: Insufficient documentation

## 2015-03-15 DIAGNOSIS — Z794 Long term (current) use of insulin: Secondary | ICD-10-CM | POA: Diagnosis not present

## 2015-03-15 DIAGNOSIS — I130 Hypertensive heart and chronic kidney disease with heart failure and stage 1 through stage 4 chronic kidney disease, or unspecified chronic kidney disease: Secondary | ICD-10-CM | POA: Insufficient documentation

## 2015-03-15 DIAGNOSIS — G4733 Obstructive sleep apnea (adult) (pediatric): Secondary | ICD-10-CM | POA: Diagnosis not present

## 2015-03-15 NOTE — Progress Notes (Signed)
[name], female   DOB: Feb 16, 1941, 74 y.o.   MRN: [id number]  PCP: Dr Charlett Blake Nephrologist: Dr Marval Regal EP: Dr Lovena Le  Pulmonary: Dr Gwenette Greet  HPI: [name] is a 74 y.o. female with a history of CAD, status post CABG in 2003, ischemic cardiomyopathy with variable ejection fractions noted in the past (echo in 12/2011 demonstrated normal LV function with an EF 60-65%), status post CRT-D, chronic combined systolic and diastolic CHF, paroxysmal atrial fibrillation, prior LV mural thrombus, on chronic Coumadin therapy, CKD, T2DM. She has a prior admission to the hospital for acute renal failure in the setting of rhabdomyolysis (while taking colchicine and fenofibrate). Carotid US (12/14): Bilateral ICA stenosis 40-59%.    She was admitted 10/23-11/11/14 with acute on chronic combined systolic and diastolic CHF. ECHO with worsening LV function with an EF of 35-40%. Hospital stay was complicated by a/c renal failure and respiratory failure. Had short term intubation. She was made a DNR/DNI.  Discharge weight 256 lbs. She is on home oxygen 2L by nasal cannula and CPAP at night, OHS/OSA.   Admitted with increased dyspnea. Treated for URI and mild volume overload. Transitioned to torsemide 40 mg in am and 20 mg in pm.   She returns for post hospital follow up. Overall feeing ok. Ongoing dyspnea exertion but this is her baseline.  Wears 3 liters [place] oxygen. Using CPAP nightly. Had PFTs last Friday.  Rides in motorized scooter. Weight at home 259-260 pounds. Has not required extra torsemide. Taking all medications. Tries to follow low salt diet and limit fluid intake. Uses a walker in the house. Taking atorvastatin  wthout difficulty.  No bleeding problems.   ECHO (03/06/13): EF 35-40% ECHO (09/07/13): EF 40%, grade II diastolic dysfunction, mildly decreased RV systolic function with normal RV size. ECHO 10/2014:  EF ~30%. Floating MV chord - which is stable from previous echo. Carotid  dopplers (1/16) with 40-59% bilateral ICA stenosis.   Labs  05/01/13 K 5.4 Creatinine 1.46 Pro BNP 646  05/19/13 K 5.0 Creatinine 1.38 3/15 LDL 41, HDL 38, creatinine 1.7 4/15 K 5.2, creatinine 1.8 09/16/13 K 4.6 Creatinine 1.6 10/15 K 5.2, creatinine 1.6, LDL 71, HDL 33, TGs 425, TSH normal 12/15 LDL 65, TGs 337 03/12/15: K 4.2 Creatinine 2.70  ROS: All systems negative except as listed in HPI, PMH and Problem List.  Past Medical History  Diagnosis Date  . LBBB (left bundle branch block)   . Carotid stenosis     a. 10/19/2011 carotid duplex - Mild hard plaque bilaterally. Stable 40-59% bilateral ICA stenosis. Carotid US (04/2013):  Bilateral 40-59% ICA.  F/u 1 year  . Dyslipidemia   . Hypertension   . Chronic combined systolic and diastolic CHF (congestive heart failure) (HCC)     a. EF as low as 25% in 2006;  b. EF 60-65% in 12/2011;  c. 02/2013 Echo: EF 35-40, mod mid-dist antsept HK, Gr 2 DD, mild LVH.  . Back pain, chronic     "just when I walk; mass on 3rd and 4th vertebrae right lower back"  . Cardiomyopathy, ischemic     a. 2012 s/p SJM 3231-40 Uni BiV ICD, ser # [id number].  Marland Kitchen CAD (coronary artery disease)     a. 05/2001 CABG x4: LIMA->LAD, VG->D1, VG->D2, VG->RCA.  Marland Kitchen Retinopathy due to secondary diabetes ([place])     type II, uncontrolled  . CKD (chronic kidney disease), stage III   . Biventricular implantable cardiac defibrillator in situ 2007,  2012    a. 2007;  b. 2012 Gen change: SJM 3231-40 Uni BiV ICD, ser # [id number].  Marland Kitchen Hypoxemia requiring supplemental oxygen   . Hypothyroidism   . 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 Benay Spice, in remission  . OSA on CPAP   . Gout ~ 08/2011  . PAF (paroxysmal atrial fibrillation) (HCC)     on Coumadin  . DM (diabetes mellitus), type 2 with complications (HCC)     insulin dependent, retinopathy, neuropathy  . Mural thrombus of left ventricle (HCC)     before 2003, while on Coumadin, No h/o CVA   . Morbid obesity with BMI of 45.0-49.9, adult (HCC)     Ht. 5'4". BMI 47.2  . Vitamin D deficiency 06/09/2012    historical  . Hypercalcemia 09/26/2013  . Anemia, unspecified 12/13/2013  . Presence of permanent cardiac pacemaker   . AICD (automatic cardioverter/defibrillator) present     Current Outpatient Prescriptions  Medication Sig Dispense Refill  . acetaminophen (TYLENOL) 500 MG tablet Take 1,000 mg by mouth every 6 (six) hours as needed (pain).    Marland Kitchen albuterol (PROVENTIL HFA;VENTOLIN HFA) 108 (90 BASE) MCG/ACT inhaler Inhale 2 puffs into the lungs every 6 (six) hours as needed for wheezing or shortness of breath. 1 Inhaler 1  . amiodarone (PACERONE) 200 MG tablet Take 1 tablet (200 mg total) by mouth daily. 180 tablet 1  . atorvastatin (LIPITOR) 20 MG tablet Take 1 tablet (20 mg total) by mouth daily. 90 tablet 1  . carvedilol (COREG) 25 MG tablet Take 2 tablets (50 mg total) by mouth 2 (two) times daily. 360 tablet 3  . escitalopram (LEXAPRO) 10 MG tablet Take 1 tablet (10 mg total) by mouth daily. 90 tablet 1  . fenofibrate (TRICOR) 48 MG tablet Take 1 tablet (48 mg total) by mouth daily. 90 tablet 3  . fluticasone (FLONASE) 50 MCG/ACT nasal spray Place 2 sprays into both nostrils daily as needed for allergies or rhinitis. As needed for nasal stuffiness. 16 g 0  . gabapentin (NEURONTIN) 300 MG capsule Take 1 capsule (300 mg total) by mouth 2 (two) times daily. 180 capsule 1  . glucose blood (FREESTYLE LITE) test strip DX: 250.60  Check sugars bid and as needed 100 each 2  . insulin NPH (HUMULIN N,NOVOLIN N) 100 UNIT/ML injection Inject 20-45 Units into the skin 2 (two) times daily before a meal. Take 45 units in the morning and 20 units in the evening    . insulin regular (NOVOLIN R,HUMULIN R) 100 units/mL injection Change to 15 units three times a with meals (to cover all meals--this is the same total amount but spread out) (Patient taking differently: Inject 15 Units into the skin 2  (two) times daily before a meal. Change to 15 units three times a with meals (to cover all meals--this is the same total amount but spread out)) 10 mL 12  . levothyroxine (SYNTHROID, LEVOTHROID) 25 MCG tablet Take 1 tablet (25 mcg total) by mouth daily before breakfast. 90 tablet 1  . LORazepam (ATIVAN) 0.5 MG tablet Take 1 tablet (0.5 mg total) by mouth at bedtime as needed for anxiety. (Patient taking differently: Take 0.5 mg by mouth at bedtime. ) 90 tablet 1  . NON FORMULARY Oxygen---24 hour    . torsemide (DEMADEX) 20 MG tablet TAKE 2 TABLETS EVERY MORNING, TAKE 1 TABLET IN THE AFTERNOON AND MAY TAKE AN EXTRA TABLET WITH A 3 POUND WEIGHT  GAIN. 300 tablet 1  . Vitamin D, Ergocalciferol, (DRISDOL) 50000 UNITS CAPS capsule Take 50,000 Units by mouth every 7 (seven) days. Tuesdays    . warfarin (COUMADIN) 5 MG tablet Take 1 tablet (5 mg total) by mouth daily at 6 PM. (Patient taking differently: Take 2.5-5 mg by mouth daily at 6 PM. Take 5mg  everyday except 2.5mg  on Thursday.)     No current facility-administered medications for this encounter.    Filed Vitals:   03/15/15 1039  BP: 122/76  Pulse: 73  Weight: 262 lb 6 oz (119.013 kg)  SpO2: 91%    PHYSICAL EXAM: General:  Obese. No resp difficulty; on 3 L continuous. Husband present . Arrived in scooter.  HEENT: normal Neck: supple. JVP 6-7 cm. Carotids 2+ bilaterally; no bruits. No lymphadenopathy or thryomegaly appreciated. Cor: PMI normal. Regular rate & rhythm. No rubs, gallops.  1/6 early SEM RUSB. Lungs: clear. On oxygen  Abdomen: Obese, soft, nontender, obese, nondistended. No hepatosplenomegaly. No bruits or masses. Good bowel sounds. Extremities: no cyanosis, clubbing, rash. No edema.  Neuro: alert & orientedx3, cranial nerves grossly intact. Moves all 4 extremities w/o difficulty. Affect pleasant.  ASSESSMENT & PLAN:  1) Chronic systolic HF: Ischemic cardiomyopathy s/p CRT-D ([employer]). Echo 08/2013: EF is about 40%. ECHO  10/2014  ~30%% limited images floating mitral valve cord same as previous. Corvue- impedance up. Volume ok.  NYHA III symptoms. Volume status stable. Continue torsemide 40 mg in am and 20 mg in pm. Extra 20 mg as needed.  - Continue current Coreg 25 mg twice a day.  - Continue enalapril to 2.5 mg daily will not increase with CKD.  - No spironolactone for given CKD.  - Reinforced the need and importance of daily weights, a low sodium diet, and fluid restriction (less than 2 L a day). Instructed to call the HF clinic if weight increases more than 3 lbs overnight or 5 lbs in a week. 2) PAF: S/P DC-CV 07/28/14. Chronic A fib . Amiodarone started 10/13/2014 by Dr Lovena Le.  Will need TSH next month. Yearly eye exam. Continue BB and Coumadin. No bleeding problems. 3) OHS/OSA: Continue nightly CPAP and home oxygen.  4) CAD: s/p CABG.  No chest pain.  She is not on ASA given comadin use and stable CAD. Restart statin => she can tolerate atorvastatin 20 . Intolerant 40 mg due to myalgia. Marland Kitchen  5) Hyperlipidemia: Tolerating atorvastatin 20 mg daily (she has been able to tolerate this dose). Triglycerides very high, continue fenofibrate 48 mg daily.  6) Carotid stenosis: Repeat dopplers in 1/17.  7) CKD. Stage III:  Creatinine baseline 2.7-2.8 Followed by Dr Marval Regal. Has follow up in January.   Follow up in 2 months  Ananda Caya NP-C  03/15/2015

## 2015-03-15 NOTE — Patient Instructions (Signed)
FOLLOW UP:2 months w/Dr.Bensimhon

## 2015-03-16 ENCOUNTER — Other Ambulatory Visit: Payer: Self-pay | Admitting: Pharmacist

## 2015-03-16 NOTE — Patient Outreach (Signed)
[facility]) Care Management  [place]   03/16/2015  Destiny [name] July 20, 1940 [id number]  Subjective: Destiny [name] is a 74yo with a recent hospitalization for chronic systolic heart failure.  I identified patient as eligible for pharmacy transition of care project.  Medication review completed.  I called patient and read consent.  Patient declined to participate in transition of care project.  Patient reports she has already had a hospital follow up on 03/15/15.    Objective:   Current Medications: Current Outpatient Prescriptions  Medication Sig Dispense Refill  . acetaminophen (TYLENOL) 500 MG tablet Take 1,000 mg by mouth every 6 (six) hours as needed (pain).    Marland Kitchen albuterol (PROVENTIL HFA;VENTOLIN HFA) 108 (90 BASE) MCG/ACT inhaler Inhale 2 puffs into the lungs every 6 (six) hours as needed for wheezing or shortness of breath. 1 Inhaler 1  . amiodarone (PACERONE) 200 MG tablet Take 1 tablet (200 mg total) by mouth daily. 180 tablet 1  . atorvastatin (LIPITOR) 20 MG tablet Take 1 tablet (20 mg total) by mouth daily. 90 tablet 1  . carvedilol (COREG) 25 MG tablet Take 2 tablets (50 mg total) by mouth 2 (two) times daily. 360 tablet 3  . escitalopram (LEXAPRO) 10 MG tablet Take 1 tablet (10 mg total) by mouth daily. 90 tablet 1  . fenofibrate (TRICOR) 48 MG tablet Take 1 tablet (48 mg total) by mouth daily. 90 tablet 3  . fluticasone (FLONASE) 50 MCG/ACT nasal spray Place 2 sprays into both nostrils daily as needed for allergies or rhinitis. As needed for nasal stuffiness. 16 g 0  . gabapentin (NEURONTIN) 300 MG capsule Take 1 capsule (300 mg total) by mouth 2 (two) times daily. 180 capsule 1  . glucose blood (FREESTYLE LITE) test strip DX: 250.60  Check sugars bid and as needed 100 each 2  . insulin NPH (HUMULIN N,NOVOLIN N) 100 UNIT/ML injection Inject 20-45 Units into the skin 2 (two) times daily before a meal. Take 45 units in the morning and 20 units in the  evening    . insulin regular (NOVOLIN R,HUMULIN R) 100 units/mL injection Change to 15 units three times a with meals (to cover all meals--this is the same total amount but spread out) (Patient taking differently: Inject 15 Units into the skin 2 (two) times daily before a meal. Change to 15 units three times a with meals (to cover all meals--this is the same total amount but spread out)) 10 mL 12  . levothyroxine (SYNTHROID, LEVOTHROID) 25 MCG tablet Take 1 tablet (25 mcg total) by mouth daily before breakfast. 90 tablet 1  . LORazepam (ATIVAN) 0.5 MG tablet Take 1 tablet (0.5 mg total) by mouth at bedtime as needed for anxiety. (Patient taking differently: Take 0.5 mg by mouth at bedtime. ) 90 tablet 1  . NON FORMULARY Oxygen---24 hour    . torsemide (DEMADEX) 20 MG tablet TAKE 2 TABLETS EVERY MORNING, TAKE 1 TABLET IN THE AFTERNOON AND MAY TAKE AN EXTRA TABLET WITH A 3 POUND WEIGHT GAIN. 300 tablet 1  . Vitamin D, Ergocalciferol, (DRISDOL) 50000 UNITS CAPS capsule Take 50,000 Units by mouth every 7 (seven) days. Tuesdays    . warfarin (COUMADIN) 5 MG tablet Take 1 tablet (5 mg total) by mouth daily at 6 PM. (Patient taking differently: Take 2.5-5 mg by mouth daily at 6 PM. Take 5mg  everyday except 2.5mg  on Thursday.)     No current facility-administered medications for this visit.  Functional Status: In your present state of health, do you have any difficulty performing the following activities: 03/12/2015 11/27/2014  Hearing? N N  Vision? N N  Difficulty concentrating or making decisions? N N  Walking or climbing stairs? Y N  Dressing or bathing? N N  Doing errands, shopping? Y N    Fall/Depression Screening: PHQ 2/9 Scores 08/31/2014  PHQ - 2 Score 0    Assessment: 1.  Medication review:   Drugs sorted by system:  Neurologic/Psychologic: escitalopram, lorazepam  Cardiovascular: amiodarone, atorvastatin, carvedilol, fenofibrate, torsemide, warfarin  Pulmonary/Allergy: albuterol  HFA, fluticasone  Gastrointestinal: none  Endocrine: insulin NPH, insulin regular, levothyroxine  Renal: none  Topical: none  Pain: acetaminophen, gabapentin  Vitamins/Minerals: ergocalciferol  Infectious Diseases: none  Miscellaneous: none    Duplications in therapy: none noted Gaps in therapy: ACEi/ARB for LVSD  Medications to avoid in the elderly: lorazepam (increase risk of cognitive impairment, delirium, falls, fractures, and motor vehicle crashes in older adults) Drug interactions: patient is on several medications that interact with warfarin (amiodarone, fenofibrate, acetaminophen, escitalopram)  Other issues noted: none noted  2.  Medication adherence:  Unable to assess  Plan: 1.  Medication review:  all medications appear appropriate based on patient's problem list.  Please use caution with medications on [place] list.  Drug interactions with warfarin noted.  Patient has been on these medications long term and warfarin has been adjusted for interactions.  Most recent INR at goal and patient has appointment to have INR rechecked on 03/30/15.  Patient has LVSD which is compelling indication for ACEi use.  Per hospital discharge summary, patient is not currently on ACEi or ARB due to CKD.  Unless the renal failure is attributed to ACEi/ARB use, these medications are still indicated.  Will send a fax to patient's cardiology office with this information.     2.  Medication adherence:  Unable to assess  Elisabeth Most, Pharm.D. Pharmacy Resident [place] [phone]

## 2015-03-23 DIAGNOSIS — I10 Essential (primary) hypertension: Secondary | ICD-10-CM | POA: Diagnosis not present

## 2015-03-23 DIAGNOSIS — E1165 Type 2 diabetes mellitus with hyperglycemia: Secondary | ICD-10-CM | POA: Diagnosis not present

## 2015-03-23 DIAGNOSIS — E7800 Pure hypercholesterolemia, unspecified: Secondary | ICD-10-CM | POA: Diagnosis not present

## 2015-03-23 DIAGNOSIS — E039 Hypothyroidism, unspecified: Secondary | ICD-10-CM | POA: Diagnosis not present

## 2015-03-28 ENCOUNTER — Encounter: Payer: Self-pay | Admitting: Adult Health

## 2015-03-30 ENCOUNTER — Ambulatory Visit (INDEPENDENT_AMBULATORY_CARE_PROVIDER_SITE_OTHER): Payer: Medicare Other | Admitting: Pharmacist

## 2015-03-30 DIAGNOSIS — Z5181 Encounter for therapeutic drug level monitoring: Secondary | ICD-10-CM | POA: Diagnosis not present

## 2015-03-30 DIAGNOSIS — I4891 Unspecified atrial fibrillation: Secondary | ICD-10-CM

## 2015-03-30 DIAGNOSIS — I482 Chronic atrial fibrillation, unspecified: Secondary | ICD-10-CM

## 2015-03-30 DIAGNOSIS — I6350 Cerebral infarction due to unspecified occlusion or stenosis of unspecified cerebral artery: Secondary | ICD-10-CM | POA: Diagnosis not present

## 2015-03-30 DIAGNOSIS — I480 Paroxysmal atrial fibrillation: Secondary | ICD-10-CM

## 2015-03-30 LAB — POCT INR: INR: 4.4

## 2015-04-13 ENCOUNTER — Ambulatory Visit (INDEPENDENT_AMBULATORY_CARE_PROVIDER_SITE_OTHER): Payer: Medicare Other

## 2015-04-13 ENCOUNTER — Ambulatory Visit (INDEPENDENT_AMBULATORY_CARE_PROVIDER_SITE_OTHER): Payer: Medicare Other | Admitting: Pharmacist

## 2015-04-13 DIAGNOSIS — Z95810 Presence of automatic (implantable) cardiac defibrillator: Secondary | ICD-10-CM

## 2015-04-13 DIAGNOSIS — I482 Chronic atrial fibrillation, unspecified: Secondary | ICD-10-CM

## 2015-04-13 DIAGNOSIS — I5042 Chronic combined systolic (congestive) and diastolic (congestive) heart failure: Secondary | ICD-10-CM | POA: Diagnosis not present

## 2015-04-13 DIAGNOSIS — I480 Paroxysmal atrial fibrillation: Secondary | ICD-10-CM

## 2015-04-13 DIAGNOSIS — I6350 Cerebral infarction due to unspecified occlusion or stenosis of unspecified cerebral artery: Secondary | ICD-10-CM

## 2015-04-13 DIAGNOSIS — Z5181 Encounter for therapeutic drug level monitoring: Secondary | ICD-10-CM

## 2015-04-13 DIAGNOSIS — I4891 Unspecified atrial fibrillation: Secondary | ICD-10-CM | POA: Diagnosis not present

## 2015-04-13 LAB — POCT INR: INR: 3.4

## 2015-04-15 NOTE — Progress Notes (Signed)
EPIC Encounter for ICM Monitoring  Patient Name: Destiny [name] is a 74 y.o. female Date: 04/15/2015 Primary Care Physican: Penni Homans, MD Primary Cardiologist: [place]/Bensimhon Electrophysiologist: Lovena Le Dry Weight: 260.4 lb       In the past month, have you:  1. Gained more than 2 pounds in a day or more than 5 pounds in a week? Yes, in the past week and was resolved with extra Torsemide.   2. Had changes in your medications (with verification of current medications)? no  3. Had more shortness of breath than is usual for you? no  4. Limited your activity because of shortness of breath? no  5. Not been able to sleep because of shortness of breath? no  6. Had increased swelling in your feet or ankles? no  7. Had symptoms of dehydration (dizziness, dry mouth, increased thirst, decreased urine output) no  8. Had changes in sodium restriction? no  9. Been compliant with medication? Yes   ICM trend:  04/13/2015 - 1 year trend    ICM Trend: 3 month view  Follow-up plan: ICM clinic phone appointment 04/28/2015 for repeat transmission, appointment with Dr Haroldine Laws 05/18/2015.  Corvue daily impedance above baseline 04/08/2015 to 04/10/2015 suggesting dryness.  She reported not drinking as many fluids during that time and felt dehydrated.  She can usually tell when she is dehydrated by the skin on back of her hands.  She increased fluids and then impedance was below baseline 04/11/2015 to 04/12/2015 suggesting fluid retention.  She stated she has taken extra Torsemide twice this week.  She eats at restaurants on the weekends and understands food is high in sodium but tries to stay within sodium limits the rest of the week. She reported Dr Haroldine Laws has limited her fluids to 40 oz daily and she stays within the limit.  She had a cold at end of October which developed into a hospitalization for CHF symptoms of 3 lb weight gain.   No changes today.    Copy of note sent to patient's  primary care physician, primary cardiologist, and device following physician.  [name], RN, CCM 04/15/2015 9:54 AM

## 2015-04-26 ENCOUNTER — Other Ambulatory Visit: Payer: Self-pay | Admitting: Internal Medicine

## 2015-04-27 ENCOUNTER — Ambulatory Visit (INDEPENDENT_AMBULATORY_CARE_PROVIDER_SITE_OTHER): Payer: Medicare Other | Admitting: *Deleted

## 2015-04-27 DIAGNOSIS — I4891 Unspecified atrial fibrillation: Secondary | ICD-10-CM | POA: Diagnosis not present

## 2015-04-27 DIAGNOSIS — I6350 Cerebral infarction due to unspecified occlusion or stenosis of unspecified cerebral artery: Secondary | ICD-10-CM | POA: Diagnosis not present

## 2015-04-27 DIAGNOSIS — I480 Paroxysmal atrial fibrillation: Secondary | ICD-10-CM

## 2015-04-27 DIAGNOSIS — Z5181 Encounter for therapeutic drug level monitoring: Secondary | ICD-10-CM | POA: Diagnosis not present

## 2015-04-27 DIAGNOSIS — I482 Chronic atrial fibrillation, unspecified: Secondary | ICD-10-CM

## 2015-04-27 LAB — POCT INR: INR: 4.9

## 2015-05-03 ENCOUNTER — Ambulatory Visit (INDEPENDENT_AMBULATORY_CARE_PROVIDER_SITE_OTHER): Payer: Medicare Other

## 2015-05-03 DIAGNOSIS — Z95810 Presence of automatic (implantable) cardiac defibrillator: Secondary | ICD-10-CM

## 2015-05-03 DIAGNOSIS — I5042 Chronic combined systolic (congestive) and diastolic (congestive) heart failure: Secondary | ICD-10-CM

## 2015-05-03 NOTE — Progress Notes (Signed)
EPIC Encounter for ICM Monitoring  Patient Name: Destiny [name] is a 74 y.o. female Date: 05/03/2015 Primary Care Physican: Penni Homans, MD Primary Cardiologist: [place] ([facility]) Electrophysiologist: Lovena Le Dry Weight: 262.2 lb   Bi-V Pacing >99%      In the past month, have you:  1. Gained more than 2 pounds in a day or more than 5 pounds in a week? Yes, 3 lbs in last week  2. Had changes in your medications (with verification of current medications)? no  3. Had more shortness of breath than is usual for you? no  4. Limited your activity because of shortness of breath? no  5. Not been able to sleep because of shortness of breath? no  6. Had increased swelling in your feet or ankles? no  7. Had symptoms of dehydration (dizziness, dry mouth, increased thirst, decreased urine output) no  8. Had changes in sodium restriction? no  9. Been compliant with medication? Yes   ICM trend:  06/28/2014 - 3 month view         ICM trend: 1 year view   Follow-up plan: ICM clinic phone appointment 06/21/2015 and office appointment with Dr Haroldine Laws 05/18/2015.  Corvue impedance below baseline since last ICM transmission from 04/14/2015 to 04/22/2015, 04/28/2015 to 05/01/2015 suggesting fluid retention.  She reported a 3 lb weight gain in last week and has taken extra 20 mg Torsemide tablet yesterday and today.  She reported 2 lb weight loss with extra medication.  She reported eating holiday food that caused the fluid retention.  She stated she is feeling better today. No changes today.  Advised to call should she have any HF symptoms.    Copy of note sent to patient's primary care physician, primary cardiologist, and device following physician.  [name], RN, CCM 05/03/2015 10:10 AM

## 2015-05-04 ENCOUNTER — Ambulatory Visit (INDEPENDENT_AMBULATORY_CARE_PROVIDER_SITE_OTHER): Payer: Medicare Other | Admitting: *Deleted

## 2015-05-04 ENCOUNTER — Other Ambulatory Visit: Payer: Self-pay | Admitting: Internal Medicine

## 2015-05-04 DIAGNOSIS — I6350 Cerebral infarction due to unspecified occlusion or stenosis of unspecified cerebral artery: Secondary | ICD-10-CM

## 2015-05-04 DIAGNOSIS — Z5181 Encounter for therapeutic drug level monitoring: Secondary | ICD-10-CM | POA: Diagnosis not present

## 2015-05-04 DIAGNOSIS — I6523 Occlusion and stenosis of bilateral carotid arteries: Secondary | ICD-10-CM

## 2015-05-04 DIAGNOSIS — I480 Paroxysmal atrial fibrillation: Secondary | ICD-10-CM

## 2015-05-04 DIAGNOSIS — I482 Chronic atrial fibrillation, unspecified: Secondary | ICD-10-CM

## 2015-05-04 DIAGNOSIS — I4891 Unspecified atrial fibrillation: Secondary | ICD-10-CM | POA: Diagnosis not present

## 2015-05-04 LAB — POCT INR: INR: 2.3

## 2015-05-09 ENCOUNTER — Other Ambulatory Visit: Payer: Self-pay | Admitting: Internal Medicine

## 2015-05-18 ENCOUNTER — Ambulatory Visit (INDEPENDENT_AMBULATORY_CARE_PROVIDER_SITE_OTHER): Payer: Medicare Other | Admitting: Pharmacist

## 2015-05-18 ENCOUNTER — Encounter (HOSPITAL_COMMUNITY): Payer: Self-pay | Admitting: Internal Medicine

## 2015-05-18 ENCOUNTER — Ambulatory Visit (HOSPITAL_COMMUNITY)
Admission: RE | Admit: 2015-05-18 | Discharge: 2015-05-18 | Disposition: A | Discharge status: Home / Self Care | Payer: Medicare Other | Source: Ambulatory Visit | Attending: Internal Medicine | Admitting: Internal Medicine

## 2015-05-18 VITALS — BP 110/62 | HR 69 | Wt 261.2 lb

## 2015-05-18 DIAGNOSIS — E038 Other specified hypothyroidism: Secondary | ICD-10-CM | POA: Insufficient documentation

## 2015-05-18 DIAGNOSIS — E1122 Type 2 diabetes mellitus with diabetic chronic kidney disease: Secondary | ICD-10-CM | POA: Insufficient documentation

## 2015-05-18 DIAGNOSIS — I6529 Occlusion and stenosis of unspecified carotid artery: Secondary | ICD-10-CM | POA: Diagnosis not present

## 2015-05-18 DIAGNOSIS — Z79899 Other long term (current) drug therapy: Secondary | ICD-10-CM | POA: Diagnosis not present

## 2015-05-18 DIAGNOSIS — Z951 Presence of aortocoronary bypass graft: Secondary | ICD-10-CM | POA: Diagnosis not present

## 2015-05-18 DIAGNOSIS — Z95810 Presence of automatic (implantable) cardiac defibrillator: Secondary | ICD-10-CM | POA: Diagnosis not present

## 2015-05-18 DIAGNOSIS — J9611 Chronic respiratory failure with hypoxia: Secondary | ICD-10-CM | POA: Insufficient documentation

## 2015-05-18 DIAGNOSIS — Z950 Presence of cardiac pacemaker: Secondary | ICD-10-CM | POA: Diagnosis not present

## 2015-05-18 DIAGNOSIS — I482 Chronic atrial fibrillation, unspecified: Secondary | ICD-10-CM

## 2015-05-18 DIAGNOSIS — R0600 Dyspnea, unspecified: Secondary | ICD-10-CM | POA: Diagnosis not present

## 2015-05-18 DIAGNOSIS — E662 Morbid (severe) obesity with alveolar hypoventilation: Secondary | ICD-10-CM | POA: Insufficient documentation

## 2015-05-18 DIAGNOSIS — Z66 Do not resuscitate: Secondary | ICD-10-CM | POA: Insufficient documentation

## 2015-05-18 DIAGNOSIS — Z7901 Long term (current) use of anticoagulants: Secondary | ICD-10-CM | POA: Insufficient documentation

## 2015-05-18 DIAGNOSIS — I129 Hypertensive chronic kidney disease with stage 1 through stage 4 chronic kidney disease, or unspecified chronic kidney disease: Secondary | ICD-10-CM | POA: Insufficient documentation

## 2015-05-18 DIAGNOSIS — Z794 Long term (current) use of insulin: Secondary | ICD-10-CM | POA: Insufficient documentation

## 2015-05-18 DIAGNOSIS — I480 Paroxysmal atrial fibrillation: Secondary | ICD-10-CM | POA: Insufficient documentation

## 2015-05-18 DIAGNOSIS — Z9981 Dependence on supplemental oxygen: Secondary | ICD-10-CM | POA: Insufficient documentation

## 2015-05-18 DIAGNOSIS — I6350 Cerebral infarction due to unspecified occlusion or stenosis of unspecified cerebral artery: Secondary | ICD-10-CM

## 2015-05-18 DIAGNOSIS — G4733 Obstructive sleep apnea (adult) (pediatric): Secondary | ICD-10-CM | POA: Diagnosis not present

## 2015-05-18 DIAGNOSIS — E1140 Type 2 diabetes mellitus with diabetic neuropathy, unspecified: Secondary | ICD-10-CM | POA: Insufficient documentation

## 2015-05-18 DIAGNOSIS — I2510 Atherosclerotic heart disease of native coronary artery without angina pectoris: Secondary | ICD-10-CM | POA: Insufficient documentation

## 2015-05-18 DIAGNOSIS — N183 Chronic kidney disease, stage 3 (moderate): Secondary | ICD-10-CM | POA: Diagnosis not present

## 2015-05-18 DIAGNOSIS — Z5181 Encounter for therapeutic drug level monitoring: Secondary | ICD-10-CM | POA: Diagnosis not present

## 2015-05-18 DIAGNOSIS — I5042 Chronic combined systolic (congestive) and diastolic (congestive) heart failure: Secondary | ICD-10-CM | POA: Diagnosis not present

## 2015-05-18 DIAGNOSIS — E785 Hyperlipidemia, unspecified: Secondary | ICD-10-CM | POA: Diagnosis not present

## 2015-05-18 DIAGNOSIS — I255 Ischemic cardiomyopathy: Secondary | ICD-10-CM | POA: Diagnosis not present

## 2015-05-18 DIAGNOSIS — E11319 Type 2 diabetes mellitus with unspecified diabetic retinopathy without macular edema: Secondary | ICD-10-CM | POA: Diagnosis not present

## 2015-05-18 DIAGNOSIS — I4891 Unspecified atrial fibrillation: Secondary | ICD-10-CM

## 2015-05-18 LAB — BASIC METABOLIC PANEL
Anion gap: 9 (ref 5–15)
BUN: 53 mg/dL — AB (ref 6–20)
CALCIUM: 9 mg/dL (ref 8.9–10.3)
CO2: 32 mmol/L (ref 22–32)
CREATININE: 3 mg/dL — AB (ref 0.44–1.00)
Chloride: 99 mmol/L — ABNORMAL LOW (ref 101–111)
GFR calc Af Amer: 17 mL/min — ABNORMAL LOW (ref >60)
GFR, EST NON AFRICAN AMERICAN: 15 mL/min — AB (ref >60)
GLUCOSE: 256 mg/dL — AB (ref 65–99)
POTASSIUM: 4.3 mmol/L (ref 3.5–5.1)
SODIUM: 140 mmol/L (ref 135–145)

## 2015-05-18 LAB — BRAIN NATRIURETIC PEPTIDE: B Natriuretic Peptide: 298.3 pg/mL — ABNORMAL HIGH (ref 0.0–100.0)

## 2015-05-18 LAB — TSH: TSH: 5.415 u[IU]/mL — AB (ref 0.350–4.500)

## 2015-05-18 LAB — POCT INR: INR: 2.7

## 2015-05-18 NOTE — Progress Notes (Signed)
[name], female   DOB: Feb 06, 1941, 75 y.o.   MRN: [id number]    Advanced Heart Failure Clinic Note   PCP: Dr Charlett Blake Nephrologist: Dr Marval Regal EP: Dr Lovena Le  Pulmonary: Dr Gwenette Greet  HPI: Destiny [name] is a 75 y.o. female with a history of CAD, status post CABG in 2003, ischemic cardiomyopathy with variable ejection fractions noted in the past (echo in 12/2011 demonstrated normal LV function with an EF 60-65%), status post CRT-D, chronic combined systolic and diastolic CHF, paroxysmal atrial fibrillation, prior LV mural thrombus, on chronic Coumadin therapy, CKD, T2DM. She has a prior admission to the hospital for acute renal failure in the setting of rhabdomyolysis (while taking colchicine and fenofibrate). Carotid US (12/14): Bilateral ICA stenosis 40-59%.    She was admitted 10/23-11/11/14 with acute on chronic combined systolic and diastolic CHF. ECHO with worsening LV function with an EF of 35-40%. Hospital stay was complicated by a/c renal failure and respiratory failure. Had short term intubation. She was made a DNR/DNI.  Discharge weight 256 lbs. She is on home oxygen 2L by nasal cannula and CPAP at night, OHS/OSA.   Admitted with increased dyspnea. Treated for URI and mild volume overload. Transitioned to torsemide 40 mg in am and 20 mg in pm.   She returns today for regular follow up. No changes made at last visit. Up-titration of meds limited by CKD. Has been feeling good overall.  Weight up and fluid up over christmas and thanksgiving with eating others peoples food (she normally doesn't add salt to hers), she has managed with occasional extra torsemide. Takes an extra torsemide if her weight goes up 2-3 over night, Tries to keep weight between 255-260. Weight at home 258.8 at home this morning. Wears 2 liters oxygen via [place] chronically, 3 liters with exertion. Continue nightly CPAP. Taking all medications. No added salt. Drinks about 40 oz of fluid daily. Uses walker in  the house. If weight is down, she does not get SOB. If weight is up, has DOE with moderate exertion, and has to rest more often. No muscle cramps on atorvastatin.  No bleeding coumadin.   Corvue- Thoracic impedence up. Periods of fluid gain over thanksgiving/christmas. Volume stable. No AT/AF alerts.   ECHO (03/06/13): EF 35-40% ECHO (09/07/13): EF 40%, grade II diastolic dysfunction, mildly decreased RV systolic function with normal RV size. ECHO 10/2014:  EF ~30%. Floating MV chord - which is stable from previous echo. Carotid dopplers (1/16) with 40-59% bilateral ICA stenosis.   Labs  05/01/13 K 5.4 Creatinine 1.46 Pro BNP 646  05/19/13 K 5.0 Creatinine 1.38 3/15 LDL 41, HDL 38, creatinine 1.7 4/15 K 5.2, creatinine 1.8 09/16/13 K 4.6 Creatinine 1.6 10/15 K 5.2, creatinine 1.6, LDL 71, HDL 33, TGs 425, TSH normal 12/15 LDL 65, TGs 337 03/12/15: K 4.2 Creatinine 2.70  ROS: All systems negative except as listed in HPI, PMH and Problem List.  Past Medical History  Diagnosis Date  . LBBB (left bundle branch block)   . Carotid stenosis     a. 10/19/2011 carotid duplex - Mild hard plaque bilaterally. Stable 40-59% bilateral ICA stenosis. Carotid US (04/2013):  Bilateral 40-59% ICA.  F/u 1 year  . Dyslipidemia   . Hypertension   . Chronic combined systolic and diastolic CHF (congestive heart failure) (HCC)     a. EF as low as 25% in 2006;  b. EF 60-65% in 12/2011;  c. 02/2013 Echo: EF 35-40, mod mid-dist antsept HK, Gr  2 DD, mild LVH.  . Back pain, chronic     "just when I walk; mass on 3rd and 4th vertebrae right lower back"  . Cardiomyopathy, ischemic     a. 2012 s/p SJM 3231-40 Uni BiV ICD, ser # [id number].  Marland Kitchen CAD (coronary artery disease)     a. 05/2001 CABG x4: LIMA->LAD, VG->D1, VG->D2, VG->RCA.  Marland Kitchen Retinopathy due to secondary diabetes ([place])     type II, uncontrolled  . CKD (chronic kidney disease), stage III   . Biventricular implantable cardiac defibrillator in situ 2007, 2012    a.  2007;  b. 2012 Gen change: SJM 3231-40 Uni BiV ICD, ser # [id number].  Marland Kitchen Hypoxemia requiring supplemental oxygen   . Hypothyroidism   . 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 Benay Spice, in remission  . OSA on CPAP   . Gout ~ 08/2011  . PAF (paroxysmal atrial fibrillation) (HCC)     on Coumadin  . DM (diabetes mellitus), type 2 with complications (HCC)     insulin dependent, retinopathy, neuropathy  . Mural thrombus of left ventricle (HCC)     before 2003, while on Coumadin, No h/o CVA  . Morbid obesity with BMI of 45.0-49.9, adult (HCC)     Ht. 5'4". BMI 47.2  . Vitamin D deficiency 06/09/2012    historical  . Hypercalcemia 09/26/2013  . Anemia, unspecified 12/13/2013  . Presence of permanent cardiac pacemaker   . AICD (automatic cardioverter/defibrillator) present     Current Outpatient Prescriptions  Medication Sig Dispense Refill  . acetaminophen (TYLENOL) 500 MG tablet Take 1,000 mg by mouth every 6 (six) hours as needed (pain).    Marland Kitchen albuterol (PROVENTIL HFA;VENTOLIN HFA) 108 (90 BASE) MCG/ACT inhaler Inhale 2 puffs into the lungs every 6 (six) hours as needed for wheezing or shortness of breath. 1 Inhaler 1  . amiodarone (PACERONE) 200 MG tablet TAKE 1 TABLET TWICE DAILY 180 tablet 2  . atorvastatin (LIPITOR) 20 MG tablet Take 1 tablet (20 mg total) by mouth daily. 90 tablet 1  . carvedilol (COREG) 25 MG tablet Take 2 tablets (50 mg total) by mouth 2 (two) times daily. 360 tablet 3  . escitalopram (LEXAPRO) 10 MG tablet Take 1 tablet (10 mg total) by mouth daily. 90 tablet 1  . fenofibrate (TRICOR) 48 MG tablet Take 1 tablet (48 mg total) by mouth daily. 90 tablet 3  . fluticasone (FLONASE) 50 MCG/ACT nasal spray Place 2 sprays into both nostrils daily as needed for allergies or rhinitis. As needed for nasal stuffiness. 16 g 0  . gabapentin (NEURONTIN) 300 MG capsule Take 1 capsule (300 mg total) by mouth 2 (two) times daily. 180 capsule 1  .  glucose blood (FREESTYLE LITE) test strip DX: 250.60  Check sugars bid and as needed 100 each 2  . insulin NPH (HUMULIN N,NOVOLIN N) 100 UNIT/ML injection Inject 20-45 Units into the skin 2 (two) times daily before a meal. Take 45 units in the morning and 20 units in the evening    . insulin regular (NOVOLIN R,HUMULIN R) 100 units/mL injection Change to 15 units three times a with meals (to cover all meals--this is the same total amount but spread out) (Patient taking differently: Inject 15 Units into the skin 2 (two) times daily before a meal. Change to 15 units three times a with meals (to cover all meals--this is the same total amount but spread out)) 10 mL 12  .  levothyroxine (SYNTHROID, LEVOTHROID) 25 MCG tablet Take 1 tablet (25 mcg total) by mouth daily before breakfast. 90 tablet 1  . LORazepam (ATIVAN) 0.5 MG tablet Take 1 tablet (0.5 mg total) by mouth at bedtime as needed for anxiety. (Patient taking differently: Take 0.5 mg by mouth at bedtime. ) 90 tablet 1  . NON FORMULARY Oxygen---24 hour    . torsemide (DEMADEX) 20 MG tablet TAKE 2 TABLETS EVERY MORNING, TAKE 1 TABLET IN THE AFTERNOON AND MAY TAKE AN EXTRA TABLET WITH A 3 POUND WEIGHT GAIN. 300 tablet 1  . Vitamin D, Ergocalciferol, (DRISDOL) 50000 UNITS CAPS capsule Take 50,000 Units by mouth every 7 (seven) days. Tuesdays    . warfarin (COUMADIN) 5 MG tablet Take 1 tablet (5 mg total) by mouth daily at 6 PM. (Patient taking differently: Take 2.5-5 mg by mouth daily at 6 PM. Take 5mg  everyday except 2.5mg  on Thursday.)     No current facility-administered medications for this encounter.    Filed Vitals:   05/18/15 0953  BP: 110/62  Pulse: 69  Weight: 261 lb 4 oz (118.502 kg)  SpO2: 93%   Wt Readings from Last 3 Encounters:  05/18/15 261 lb 4 oz (118.502 kg)  03/15/15 262 lb 6 oz (119.013 kg)  03/13/15 259 lb 6.4 oz (117.663 kg)    PHYSICAL EXAM: General:  Obese, Elderly appearing, NAD;  Husband present. Arrived in  scooter.  HEENT: normal; on 2 L continuous 02 via [place]. Neck: Thick, supple. JVP 6 to 7 cm. Carotids 2+ bilaterally; no bruits. No thyromegaly or nodule noted. Cor: PMI normal. RRR. No gallops or rubs.  1/6 early SEM RUSB. Lungs: CTAB, normal effort. On 02.  Abdomen: Obese, soft, NT, ND, no HSM. No bruits or masses. +BS  Extremities: no cyanosis, clubbing, rash. No peripheral edema.  Neuro: A&Ox3, cranial nerves grossly intact. Moves all 4 extremities w/o difficulty. Affect pleasant.  ASSESSMENT & PLAN:  1) Chronic combined diastolic/systolic HF: Ischemic cardiomyopathy s/p CRT-D ([employer]). Echo 08/2013: EF is about 40%. ECHO 10/2014  25-30%, doppler consistent with restrictive physiology. limited images floating mitral valve cord same as previous.  - NYHA III symptoms. Volume status stable on exam and Corvue. - Continue sliding scale torsemide with 40 mg am and 20 mg pm. Take an extra 20 mg as needed for 2-3 lb weight gain.  - Continue current Coreg 25 mg BID - Continue enalapril 2.5 mg daily; No up-titration with CKD.  - No spironolactone with CKD.  - Reinforced the need and importance of daily weights, a low sodium diet, and fluid restriction (less than 2 L a day). Instructed to call the HF clinic if weight increases more than 3 lbs overnight or 5 lbs in a week. 2) PAF: S/P DC-CV 07/28/14. Chronic A fib. - Amiodarone started 10/13/2014 by Dr Lovena Le. Has not had TSH checked since 5//2016 in our system. Will check today. Needs yearly eye exams.  - Continue BB and coumadin. Denies bleeding on coumadin. Has coumadin clinic appt today. - No AF on corvue. 3) OHS/OSA: Continue nightly CPAP and chronic home oxygen.  4) CAD: s/p CABG.  Denies CP. No ASA with stable CAD and coumadin use. - Continue statin as below. 5) Hyperlipidemia:  - Tolerating atorvastatin 20 mg daily (unable to tolerate 40 mg with myalgias) - Triglycerides very high in 02/2015, continue fenofibrate 48 mg daily.  - Further  management per PCP.  6) Carotid stenosis: Repeat dopplers scheduled for 05/24/15.  7) CKD. Stage  III:  Creatinine baseline 2.7-2.8. Saw Dr Marval Regal in [place]. Reported stable. Sees him again March.  Follow up in 3 months. Check BMET, BNP, and TSH today. Knows to call clinic with worsening symptoms or weight gain not controlled with her sliding scale torsemide.   Satira Mccallum Tillery PA-C  05/18/2015

## 2015-05-18 NOTE — Patient Instructions (Signed)
Routine lab work today. Will notify you of abnormal results, otherwise no news is good news!  Follow up 3 months.  Do the following things EVERYDAY: 1) Weigh yourself in the morning before breakfast. Write it down and keep it in a log. 2) Take your medicines as prescribed 3) Eat low salt foods-Limit salt (sodium) to 2000 mg per day.  4) Stay as active as you can everyday 5) Limit all fluids for the day to less than 2 liters  

## 2015-05-23 ENCOUNTER — Other Ambulatory Visit: Payer: Self-pay | Admitting: Family Medicine

## 2015-05-24 ENCOUNTER — Inpatient Hospital Stay (HOSPITAL_COMMUNITY): Admission: RE | Admit: 2015-05-24 | Payer: Medicare Other | Source: Ambulatory Visit

## 2015-05-24 ENCOUNTER — Ambulatory Visit (HOSPITAL_COMMUNITY)
Admission: RE | Admit: 2015-05-24 | Discharge: 2015-05-24 | Disposition: A | Discharge status: Home / Self Care | Payer: Medicare Other | Source: Ambulatory Visit | Attending: Internal Medicine | Admitting: Internal Medicine

## 2015-05-24 DIAGNOSIS — E785 Hyperlipidemia, unspecified: Secondary | ICD-10-CM | POA: Insufficient documentation

## 2015-05-24 DIAGNOSIS — I6523 Occlusion and stenosis of bilateral carotid arteries: Secondary | ICD-10-CM | POA: Diagnosis not present

## 2015-05-24 DIAGNOSIS — I129 Hypertensive chronic kidney disease with stage 1 through stage 4 chronic kidney disease, or unspecified chronic kidney disease: Secondary | ICD-10-CM | POA: Insufficient documentation

## 2015-05-24 DIAGNOSIS — N183 Chronic kidney disease, stage 3 (moderate): Secondary | ICD-10-CM | POA: Diagnosis not present

## 2015-05-24 DIAGNOSIS — E1122 Type 2 diabetes mellitus with diabetic chronic kidney disease: Secondary | ICD-10-CM | POA: Diagnosis not present

## 2015-05-27 ENCOUNTER — Ambulatory Visit (HOSPITAL_COMMUNITY)
Admission: RE | Admit: 2015-05-27 | Discharge: 2015-05-27 | Disposition: A | Discharge status: Home / Self Care | Payer: Medicare Other | Source: Ambulatory Visit | Attending: Internal Medicine | Admitting: Internal Medicine

## 2015-05-27 DIAGNOSIS — I5042 Chronic combined systolic (congestive) and diastolic (congestive) heart failure: Secondary | ICD-10-CM

## 2015-05-27 DIAGNOSIS — E038 Other specified hypothyroidism: Secondary | ICD-10-CM | POA: Insufficient documentation

## 2015-05-27 LAB — HEPATIC FUNCTION PANEL
ALK PHOS: 41 U/L (ref 38–126)
ALT: 18 U/L (ref 14–54)
AST: 25 U/L (ref 15–41)
Albumin: 3.4 g/dL — ABNORMAL LOW (ref 3.5–5.0)
BILIRUBIN INDIRECT: 0.2 mg/dL — AB (ref 0.3–0.9)
Bilirubin, Direct: 0.2 mg/dL (ref 0.1–0.5)
TOTAL PROTEIN: 6.4 g/dL — AB (ref 6.5–8.1)
Total Bilirubin: 0.4 mg/dL (ref 0.3–1.2)

## 2015-05-27 LAB — T4, FREE: FREE T4: 1.1 ng/dL (ref 0.61–1.12)

## 2015-05-28 LAB — T3, FREE: T3, Free: 1.8 pg/mL — ABNORMAL LOW (ref 2.0–4.4)

## 2015-06-06 ENCOUNTER — Telehealth: Payer: Self-pay | Admitting: Family Medicine

## 2015-06-06 ENCOUNTER — Telehealth: Payer: Self-pay

## 2015-06-06 DIAGNOSIS — J9610 Chronic respiratory failure, unspecified whether with hypoxia or hypercapnia: Secondary | ICD-10-CM

## 2015-06-06 NOTE — Telephone Encounter (Signed)
Call to patient.  She reported she needs to get a new O2 concentrator and was not sure who to contact but [facility] has requested she get the information for new script and office notes sent to them. Advised to contact PCP office for authorization and [facility] should have been requesting the information and prescription but unsure why she was asked to do it instead.  She stated she would call Dr Frederik Pear office.   Received voice mail message for return call.

## 2015-06-06 NOTE — Telephone Encounter (Signed)
Have written addendum on my last note so we can order her oxygen

## 2015-06-06 NOTE — Telephone Encounter (Signed)
I think with new rules we need a visit to document need and switch to portable tank but reach out to her home health that supplies her oxygen and confirm. They may be able to go out and check her oxygen to document need without her needing to come in and see me. Then they could send me results

## 2015-06-06 NOTE — Addendum Note (Signed)
Addended by: Sharon Seller B on: 06/06/2015 04:19 PM   Modules accepted: Orders

## 2015-06-06 NOTE — Telephone Encounter (Signed)
Caller name: Self  Can be reached: [phone]   Fax number [phone] (faX RX TO)  Reason for call: Patient states it is time for her rx for Oxygen to be renewed. She is requesting that the rx this time be for portable O2 and not the tanks. They also need the notes from her last OV

## 2015-06-06 NOTE — Telephone Encounter (Signed)
Called [facility] at [phone] spoke to [place]. They need a script requesting to change to POC from tank (cyclinders. Office notes from the last 6 months.  The notes can be added onto or do a letter stating why the patient needs to change to POC from tanks.  Stating that health is deteriorating to make medically justified.   Scrip is on counter for signature, will fax along with notes/letter to [facility] at [phone] Attn. Rodena Piety.

## 2015-06-07 NOTE — Telephone Encounter (Signed)
Faxed over order to [facility] [place] [place], called Rodena Piety at [place] to confirm instructions.

## 2015-06-14 ENCOUNTER — Other Ambulatory Visit: Payer: Self-pay | Admitting: Family Medicine

## 2015-06-15 ENCOUNTER — Ambulatory Visit (INDEPENDENT_AMBULATORY_CARE_PROVIDER_SITE_OTHER): Payer: Medicare Other | Admitting: *Deleted

## 2015-06-15 DIAGNOSIS — I482 Chronic atrial fibrillation, unspecified: Secondary | ICD-10-CM

## 2015-06-15 DIAGNOSIS — I480 Paroxysmal atrial fibrillation: Secondary | ICD-10-CM

## 2015-06-15 DIAGNOSIS — Z5181 Encounter for therapeutic drug level monitoring: Secondary | ICD-10-CM | POA: Diagnosis not present

## 2015-06-15 DIAGNOSIS — I6350 Cerebral infarction due to unspecified occlusion or stenosis of unspecified cerebral artery: Secondary | ICD-10-CM

## 2015-06-15 DIAGNOSIS — I4891 Unspecified atrial fibrillation: Secondary | ICD-10-CM | POA: Diagnosis not present

## 2015-06-15 LAB — POCT INR: INR: 2.1

## 2015-06-21 ENCOUNTER — Telehealth: Payer: Self-pay | Admitting: Cardiology

## 2015-06-21 ENCOUNTER — Ambulatory Visit (INDEPENDENT_AMBULATORY_CARE_PROVIDER_SITE_OTHER): Payer: Medicare Other

## 2015-06-21 DIAGNOSIS — I5042 Chronic combined systolic (congestive) and diastolic (congestive) heart failure: Secondary | ICD-10-CM | POA: Diagnosis not present

## 2015-06-21 DIAGNOSIS — Z95810 Presence of automatic (implantable) cardiac defibrillator: Secondary | ICD-10-CM

## 2015-06-21 DIAGNOSIS — N184 Chronic kidney disease, stage 4 (severe): Secondary | ICD-10-CM | POA: Diagnosis not present

## 2015-06-21 DIAGNOSIS — D509 Iron deficiency anemia, unspecified: Secondary | ICD-10-CM | POA: Diagnosis not present

## 2015-06-21 NOTE — Telephone Encounter (Signed)
Spoke with pt and reminded pt of remote transmission that is due today. Pt verbalized understanding.   

## 2015-06-21 NOTE — Progress Notes (Signed)
EPIC Encounter for ICM Monitoring  Patient Name: Destiny [name] is a 75 y.o. female Date: 06/21/2015 Primary Care Physican: Penni Homans, MD Primary Cardiologist: [place] Electrophysiologist: Druscilla Brownie Weight: 260 lbs       In the past month, have you:  1. Gained more than 2 pounds in a day or more than 5 pounds in a week? Yes, 4 lbs in last week due increase in salt intake.  She has last 3 of the 4 lbs that she has gained.   2. Had changes in your medications (with verification of current medications)? no  3. Had more shortness of breath than is usual for you? no  4. Limited your activity because of shortness of breath? no  5. Not been able to sleep because of shortness of breath? no  6. Had increased swelling in your feet or ankles? no  7. Had symptoms of dehydration (dizziness, dry mouth, increased thirst, decreased urine output) no  8. Had changes in sodium restriction? no  9. Been compliant with medication? Yes   ICM trend: 3 month view for 06/20/2015   ICM trend: 1 year view for 06/20/2015   Follow-up plan: ICM clinic phone appointment 07/27/2015.  Corvue thoracic impedance below reference line suggesting fluid retention.  She reported she has eaten foods that were high in sodium in the last week and gained weight. She has taken extra dose of Torsemide x 2 days and weight has decreased.  She stated she is feeling much better and she knew the fried seafood dinner she had made her retain fluid.  Encouraged her to resume low salt diet.  Encouraged her to call if she has fluid symptoms.  She reported her son had a heart attack in the last week so she has been under lot of stress.  She has appointment with Dr Myna Hidalgo, nephrologist, tomorrow to check her kidneys since creatinine is high.  She has had labs drawn and new creatinine will be checked.  No changes today.   LABS: 05/18/2015 Creatinine 3.0, BUN 53, K 4.3  Copy of note sent to patient's primary care physician, primary  cardiologist, and device following physician.  [name], RN, CCM 06/21/2015 3:25 PM

## 2015-06-23 DIAGNOSIS — I429 Cardiomyopathy, unspecified: Secondary | ICD-10-CM | POA: Diagnosis not present

## 2015-06-23 DIAGNOSIS — I4891 Unspecified atrial fibrillation: Secondary | ICD-10-CM | POA: Diagnosis not present

## 2015-06-23 DIAGNOSIS — N189 Chronic kidney disease, unspecified: Secondary | ICD-10-CM | POA: Diagnosis not present

## 2015-06-23 DIAGNOSIS — E669 Obesity, unspecified: Secondary | ICD-10-CM | POA: Diagnosis not present

## 2015-06-23 DIAGNOSIS — D509 Iron deficiency anemia, unspecified: Secondary | ICD-10-CM | POA: Diagnosis not present

## 2015-06-23 DIAGNOSIS — G4733 Obstructive sleep apnea (adult) (pediatric): Secondary | ICD-10-CM | POA: Diagnosis not present

## 2015-06-23 DIAGNOSIS — I129 Hypertensive chronic kidney disease with stage 1 through stage 4 chronic kidney disease, or unspecified chronic kidney disease: Secondary | ICD-10-CM | POA: Diagnosis not present

## 2015-06-23 DIAGNOSIS — N2581 Secondary hyperparathyroidism of renal origin: Secondary | ICD-10-CM | POA: Diagnosis not present

## 2015-06-23 DIAGNOSIS — D638 Anemia in other chronic diseases classified elsewhere: Secondary | ICD-10-CM | POA: Diagnosis not present

## 2015-06-23 DIAGNOSIS — E1129 Type 2 diabetes mellitus with other diabetic kidney complication: Secondary | ICD-10-CM | POA: Diagnosis not present

## 2015-06-29 ENCOUNTER — Other Ambulatory Visit (HOSPITAL_COMMUNITY): Payer: Self-pay

## 2015-06-29 ENCOUNTER — Other Ambulatory Visit: Payer: Self-pay | Admitting: Internal Medicine

## 2015-06-29 ENCOUNTER — Encounter (HOSPITAL_COMMUNITY): Payer: Medicare Other

## 2015-06-30 ENCOUNTER — Ambulatory Visit (HOSPITAL_COMMUNITY)
Admission: RE | Admit: 2015-06-30 | Discharge: 2015-06-30 | Disposition: A | Discharge status: Home / Self Care | Payer: Medicare Other | Source: Ambulatory Visit | Attending: Nephrology | Admitting: Nephrology

## 2015-06-30 DIAGNOSIS — D509 Iron deficiency anemia, unspecified: Secondary | ICD-10-CM | POA: Diagnosis not present

## 2015-06-30 MED ORDER — SODIUM CHLORIDE 0.9 % IV SOLN
510.0000 mg | INTRAVENOUS | Status: DC
  Administered 2015-06-30: 510 mg via INTRAVENOUS
  Filled 2015-06-30: qty 17

## 2015-07-05 ENCOUNTER — Other Ambulatory Visit (HOSPITAL_COMMUNITY): Payer: Self-pay | Admitting: *Deleted

## 2015-07-06 ENCOUNTER — Ambulatory Visit (HOSPITAL_COMMUNITY)
Admission: RE | Admit: 2015-07-06 | Discharge: 2015-07-06 | Disposition: A | Discharge status: Home / Self Care | Payer: Medicare Other | Source: Ambulatory Visit | Attending: Nephrology | Admitting: Nephrology

## 2015-07-06 DIAGNOSIS — D509 Iron deficiency anemia, unspecified: Secondary | ICD-10-CM | POA: Diagnosis not present

## 2015-07-06 MED ORDER — SODIUM CHLORIDE 0.9 % IV SOLN
510.0000 mg | INTRAVENOUS | Status: DC
  Administered 2015-07-06: 510 mg via INTRAVENOUS
  Filled 2015-07-06: qty 17

## 2015-07-14 ENCOUNTER — Ambulatory Visit (INDEPENDENT_AMBULATORY_CARE_PROVIDER_SITE_OTHER): Payer: Medicare Other | Admitting: Family Medicine

## 2015-07-14 ENCOUNTER — Encounter: Payer: Self-pay | Admitting: Family Medicine

## 2015-07-14 VITALS — BP 120/68 | HR 72 | Temp 98.0°F | Ht 64.0 in | Wt 264.2 lb

## 2015-07-14 DIAGNOSIS — Z794 Long term (current) use of insulin: Secondary | ICD-10-CM

## 2015-07-14 DIAGNOSIS — N183 Chronic kidney disease, stage 3 unspecified: Secondary | ICD-10-CM

## 2015-07-14 DIAGNOSIS — E559 Vitamin D deficiency, unspecified: Secondary | ICD-10-CM | POA: Diagnosis not present

## 2015-07-14 DIAGNOSIS — E11319 Type 2 diabetes mellitus with unspecified diabetic retinopathy without macular edema: Secondary | ICD-10-CM

## 2015-07-14 DIAGNOSIS — I6350 Cerebral infarction due to unspecified occlusion or stenosis of unspecified cerebral artery: Secondary | ICD-10-CM | POA: Diagnosis not present

## 2015-07-14 DIAGNOSIS — E1122 Type 2 diabetes mellitus with diabetic chronic kidney disease: Secondary | ICD-10-CM

## 2015-07-14 DIAGNOSIS — E782 Mixed hyperlipidemia: Secondary | ICD-10-CM

## 2015-07-14 DIAGNOSIS — I10 Essential (primary) hypertension: Secondary | ICD-10-CM | POA: Diagnosis not present

## 2015-07-14 LAB — CBC WITH DIFFERENTIAL/PLATELET
Basophils Absolute: 0 10*3/uL (ref 0.0–0.1)
Basophils Relative: 0.3 % (ref 0.0–3.0)
EOS PCT: 1.7 % (ref 0.0–5.0)
Eosinophils Absolute: 0.1 10*3/uL (ref 0.0–0.7)
HCT: 32.8 % — ABNORMAL LOW (ref 36.0–46.0)
HEMOGLOBIN: 10.7 g/dL — AB (ref 12.0–15.0)
Lymphocytes Relative: 21.5 % (ref 12.0–46.0)
Lymphs Abs: 1.3 10*3/uL (ref 0.7–4.0)
MCHC: 32.7 g/dL (ref 30.0–36.0)
MCV: 90.3 fl (ref 78.0–100.0)
MONO ABS: 0.6 10*3/uL (ref 0.1–1.0)
Monocytes Relative: 9.7 % (ref 3.0–12.0)
Neutro Abs: 4.1 10*3/uL (ref 1.4–7.7)
Neutrophils Relative %: 66.8 % (ref 43.0–77.0)
Platelets: 154 10*3/uL (ref 150.0–400.0)
RBC: 3.64 Mil/uL — AB (ref 3.87–5.11)
RDW: 18.5 % — ABNORMAL HIGH (ref 11.5–15.5)
WBC: 6.1 10*3/uL (ref 4.0–10.5)

## 2015-07-14 LAB — COMPREHENSIVE METABOLIC PANEL
ALBUMIN: 3.9 g/dL (ref 3.5–5.2)
ALK PHOS: 38 U/L — AB (ref 39–117)
ALT: 14 U/L (ref 0–35)
AST: 20 U/L (ref 0–37)
BUN: 48 mg/dL — AB (ref 6–23)
CALCIUM: 9.2 mg/dL (ref 8.4–10.5)
CO2: 34 mEq/L — ABNORMAL HIGH (ref 19–32)
Chloride: 99 mEq/L (ref 96–112)
Creatinine, Ser: 2.65 mg/dL — ABNORMAL HIGH (ref 0.40–1.20)
GFR: 18.77 mL/min — AB (ref >60.00)
Glucose, Bld: 105 mg/dL — ABNORMAL HIGH (ref 70–99)
POTASSIUM: 4.2 meq/L (ref 3.5–5.1)
SODIUM: 140 meq/L (ref 135–145)
TOTAL PROTEIN: 6.9 g/dL (ref 6.0–8.3)
Total Bilirubin: 0.5 mg/dL (ref 0.2–1.2)

## 2015-07-14 LAB — LIPID PANEL
CHOLESTEROL: 148 mg/dL (ref 0–200)
HDL: 40.3 mg/dL (ref >39.00)
LDL CALC: 68 mg/dL (ref 0–99)
NonHDL: 107.44
TRIGLYCERIDES: 195 mg/dL — AB (ref 0.0–149.0)
Total CHOL/HDL Ratio: 4
VLDL: 39 mg/dL (ref 0.0–40.0)

## 2015-07-14 NOTE — Progress Notes (Signed)
Pre visit review using our clinic review tool, if applicable. No additional management support is needed unless otherwise documented below in the visit note. 

## 2015-07-14 NOTE — Assessment & Plan Note (Signed)
Currently in AM Novolin N is 30 units in am and 10 units in pm Novolin R is 10 units in am and 10 units in pm most days. Sugars ranging from 90s in am, does not check sugars frequently in pm unless concerned calorie intake, often after overindulging it is no higher than 250 Asymptomatic feeling well

## 2015-07-14 NOTE — Patient Instructions (Signed)
Anemia, Nonspecific Anemia is a condition in which the concentration of red blood cells or hemoglobin in the blood is below normal. Hemoglobin is a substance in red blood cells that carries oxygen to the tissues of the body. Anemia results in not enough oxygen reaching these tissues.  CAUSES  Common causes of anemia include:   Excessive bleeding. Bleeding may be internal or external. This includes excessive bleeding from periods (in women) or from the intestine.   Poor nutrition.   Chronic kidney, thyroid, and liver disease.  Bone marrow disorders that decrease red blood cell production.  Cancer and treatments for cancer.  HIV, AIDS, and their treatments.  Spleen problems that increase red blood cell destruction.  Blood disorders.  Excess destruction of red blood cells due to infection, medicines, and autoimmune disorders. SIGNS AND SYMPTOMS   Minor weakness.   Dizziness.   Headache.  Palpitations.   Shortness of breath, especially with exercise.   Paleness.  Cold sensitivity.  Indigestion.  Nausea.  Difficulty sleeping.  Difficulty concentrating. Symptoms may occur suddenly or they may develop slowly.  DIAGNOSIS  Additional blood tests are often needed. These help your health care provider determine the best treatment. Your health care provider will check your stool for blood and look for other causes of blood loss.  TREATMENT  Treatment varies depending on the cause of the anemia. Treatment can include:   Supplements of iron, vitamin B12, or folic acid.   Hormone medicines.   A blood transfusion. This may be needed if blood loss is severe.   Hospitalization. This may be needed if there is significant continual blood loss.   Dietary changes.  Spleen removal. HOME CARE INSTRUCTIONS Keep all follow-up appointments. It often takes many weeks to correct anemia, and having your health care provider check on your condition and your response to  treatment is very important. SEEK IMMEDIATE MEDICAL CARE IF:   You develop extreme weakness, shortness of breath, or chest pain.   You become dizzy or have trouble concentrating.  You develop heavy vaginal bleeding.   You develop a rash.   You have bloody or black, tarry stools.   You faint.   You vomit up blood.   You vomit repeatedly.   You have abdominal pain.  You have a fever or persistent symptoms for more than 2-3 days.   You have a fever and your symptoms suddenly get worse.   You are dehydrated.  MAKE SURE YOU:  Understand these instructions.  Will watch your condition.  Will get help right away if you are not doing well or get worse.   This information is not intended to replace advice given to you by your health care provider. Make sure you discuss any questions you have with your health care provider.   Document Released: 06/07/2004 Document Revised: 12/31/2012 Document Reviewed: 10/24/2012 Elsevier Interactive Patient Education 2016 [employer] Inc.  

## 2015-07-14 NOTE — Assessment & Plan Note (Signed)
Well controlled, no changes to meds. Encouraged heart healthy diet such as the DASH diet and exercise as tolerated.  °

## 2015-07-16 ENCOUNTER — Other Ambulatory Visit: Payer: Self-pay | Admitting: Family Medicine

## 2015-07-24 NOTE — Assessment & Plan Note (Signed)
Follows with Dr Chalmers Cater, hgba1c acceptable, minimize simple carbs. Increase exercise as tolerated. Continue current meds

## 2015-07-24 NOTE — Assessment & Plan Note (Signed)
Continues to follow with [facility], is stable,

## 2015-07-24 NOTE — Assessment & Plan Note (Signed)
Continue vitamin D supplementation 

## 2015-07-24 NOTE — Progress Notes (Signed)
Subjective:    [name], female    DOB: [birth year], 75 y.o.   MRN: [id number]  Chief Complaint  Patient presents with  . Follow-up    HPI Patient is in today for Follow-up. Overall she's doing well she just is dyspnea on exertion no recent hospitalization. Continues to follow with [facility]. No acute concerns. No polyuria or polydipsia. Denies CP/palp/SOB/HA/congestion/fevers/GI or GU c/o. Taking meds as prescribed  Past Medical History  Diagnosis Date  . LBBB (left bundle branch block)   . Carotid stenosis     a. 10/19/2011 carotid duplex - Mild hard plaque bilaterally. Stable 40-59% bilateral ICA stenosis. Carotid US (04/2013):  Bilateral 40-59% ICA.  F/u 1 year  . Dyslipidemia   . Hypertension   . Chronic combined systolic and diastolic CHF (congestive heart failure) (HCC)     a. EF as low as 25% in 2006;  b. EF 60-65% in 12/2011;  c. 02/2013 Echo: EF 35-40, mod mid-dist antsept HK, Gr 2 DD, mild LVH.  . Back pain, chronic     "just when I walk; mass on 3rd and 4th vertebrae right lower back"  . Cardiomyopathy, ischemic     a. 2012 s/p SJM 3231-40 Uni BiV ICD, ser # [id number].  Marland Kitchen CAD (coronary artery disease)     a. 05/2001 CABG x4: LIMA->LAD, VG->D1, VG->D2, VG->RCA.  Marland Kitchen Retinopathy due to secondary diabetes ([place])     type II, uncontrolled  . CKD (chronic kidney disease), stage III   . Biventricular implantable cardiac defibrillator in situ 2007, 2012    a. 2007;  b. 2012 Gen change: SJM 3231-40 Uni BiV ICD, ser # [id number].  Marland Kitchen Hypoxemia requiring supplemental oxygen   . Hypothyroidism   . 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 Benay Spice, in remission  . OSA on CPAP   . Gout ~ 08/2011  . PAF (paroxysmal atrial fibrillation) (HCC)     on Coumadin  . DM (diabetes mellitus), type 2 with complications (HCC)     insulin dependent, retinopathy, neuropathy  . Mural thrombus of left ventricle (HCC)    before 2003, while on Coumadin, No h/o CVA  . Morbid obesity with BMI of 45.0-49.9, adult (HCC)     Ht. 5'4". BMI 47.2  . Vitamin D deficiency 06/09/2012    historical  . Hypercalcemia 09/26/2013  . Anemia, unspecified 12/13/2013  . Presence of permanent cardiac pacemaker   . AICD (automatic cardioverter/defibrillator) present     Past Surgical History  Procedure Laterality Date  . Cardiac catheterization  06/03/01  . Cholecystectomy  1970  . Tubal ligation  1972  . Abdominal hysterectomy  1982  . Insert / replace / remove pacemaker  2007; 2012    w/AICD  . Coronary artery bypass graft  2003    CABG X4  . Cataract extraction      left eye  . Cardioversion N/A 07/30/2014    Procedure: CARDIOVERSION;  Surgeon: Jolaine Artist, MD;  Location: [facility] END[facility]COPY;  Service: Cardiovascular;  Laterality: N/A;  . Refractive surgery      right eye  . Cardioversion N/A 11/25/2014    Procedure: CARDIOVERSION;  Surgeon: Evans Lance, MD;  Location: [facility] ENDOSCOPY;  Service: Cardiovascular;  Laterality: N/A;    Family History  Problem Relation Age of Onset  . Kidney cancer Mother     kidney and female repo - died @ 59  . Asthma  Mother   . Cancer Mother     gyn and renal  . Heart disease Mother     CAD  . Heart disease Father     died @ 51  . Stroke Father   . Diabetes Father   . Hypertension Father   . Hyperlipidemia Father   . Hypertension Son   . Hypertension Maternal Aunt   . Kidney cancer Maternal Uncle   . Cancer Maternal Uncle   . Cirrhosis Maternal Grandmother     non alcohol  . Cancer Maternal Grandfather   . Kidney cancer Maternal Grandfather   . Heart attack Father     Social History   Social History  . Marital Status: Married    Spouse Name: N/A  . Number of Children: Y  . Years of Education: N/A   Occupational History  . 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    Social History Main Topics  . Smoking status: Never Smoker   . Smokeless tobacco: Never Used  .  Alcohol Use: No  . Drug Use: No  . Sexual Activity: Not on file     Comment: lives with husband, no major dietary restrictions   Other Topics Concern  . Not on file   Social History Narrative   Lives in [place] with her husband.  She does not routinely exercise or adhere to any particular diet.      Outpatient Prescriptions Prior to Visit  Medication Sig Dispense Refill  . acetaminophen (TYLENOL) 500 MG tablet Take 1,000 mg by mouth every 6 (six) hours as needed (pain).    Marland Kitchen albuterol (PROVENTIL HFA;VENTOLIN HFA) 108 (90 BASE) MCG/ACT inhaler Inhale 2 puffs into the lungs every 6 (six) hours as needed for wheezing or shortness of breath. 1 Inhaler 1  . amiodarone (PACERONE) 200 MG tablet TAKE 1 TABLET TWICE DAILY 180 tablet 2  . atorvastatin (LIPITOR) 20 MG tablet TAKE 1 TABLET EVERY DAY 90 tablet 1  . carvedilol (COREG) 25 MG tablet Take 2 tablets (50 mg total) by mouth 2 (two) times daily. 360 tablet 3  . fenofibrate (TRICOR) 48 MG tablet Take 1 tablet (48 mg total) by mouth daily. 90 tablet 3  . fluticasone (FLONASE) 50 MCG/ACT nasal spray Place 2 sprays into both nostrils daily as needed for allergies or rhinitis. As needed for nasal stuffiness. 16 g 0  . gabapentin (NEURONTIN) 300 MG capsule Take 1 capsule (300 mg total) by mouth 2 (two) times daily. 180 capsule 1  . glucose blood (FREESTYLE LITE) test strip DX: 250.60  Check sugars bid and as needed 100 each 2  . insulin NPH (HUMULIN N,NOVOLIN N) 100 UNIT/ML injection Inject 20-45 Units into the skin 2 (two) times daily before a meal. Take 45 units in the morning and 20 units in the evening    . insulin regular (NOVOLIN R,HUMULIN R) 100 units/mL injection Change to 15 units three times a with meals (to cover all meals--this is the same total amount but spread out) (Patient taking differently: Inject 15 Units into the skin 2 (two) times daily before a meal. Change to 15 units three times a with meals (to cover all meals--this is  the same total amount but spread out)) 10 mL 12  . LORazepam (ATIVAN) 0.5 MG tablet Take 1 tablet (0.5 mg total) by mouth at bedtime as needed for anxiety. (Patient taking differently: Take 0.5 mg by mouth at bedtime. ) 90 tablet 1  . NON  FORMULARY Oxygen---24 hour    . torsemide (DEMADEX) 20 MG tablet TAKE 2 TABLETS EVERY MORNING, TAKE 1 TABLET IN THE AFTERNOON AND MAY TAKE AN EXTRA TABLET WITH A 3 POUND WEIGHT GAIN. 360 tablet 1  . Vitamin D, Ergocalciferol, (DRISDOL) 50000 UNITS CAPS capsule Take 50,000 Units by mouth every 7 (seven) days. Tuesdays    . warfarin (COUMADIN) 5 MG tablet TAKE AS DIRECTED BY ANTICOAGULATION CLINIC. 70 tablet 1  . escitalopram (LEXAPRO) 10 MG tablet Take 1 tablet (10 mg total) by mouth daily. 90 tablet 1  . levothyroxine (SYNTHROID, LEVOTHROID) 25 MCG tablet Take 1 tablet (25 mcg total) by mouth daily before breakfast. 90 tablet 1   No facility-administered medications prior to visit.    Allergies  Allergen Reactions  . Sulfonamide Derivatives Other (See Comments)    Unknown; "childhood allergy/mother"    Review of Systems  Constitutional: Positive for malaise/fatigue. Negative for fever.  HENT: Negative for congestion.   Eyes: Negative for blurred vision.  Respiratory: Positive for shortness of breath.   Cardiovascular: Negative for chest pain, palpitations and leg swelling.  Gastrointestinal: Negative for nausea, abdominal pain and blood in stool.  Genitourinary: Negative for dysuria and frequency.  Musculoskeletal: Negative for falls.  Skin: Negative for rash.  Neurological: Negative for dizziness, loss of consciousness and headaches.  Endo/Heme/Allergies: Negative for environmental allergies.  Psychiatric/Behavioral: Negative for depression. The patient is not nervous/anxious.        Objective:    Physical Exam  Constitutional: She is oriented to person, place, and time. She appears well-developed and well-nourished. No distress.  HENT:    Head: Normocephalic and atraumatic.  Nose: Nose normal.  Eyes: Right eye exhibits no discharge. Left eye exhibits no discharge.  Neck: Normal range of motion. Neck supple.  Cardiovascular: Normal rate and regular rhythm.   Murmur heard. Pulmonary/Chest: Effort normal and breath sounds normal.  Abdominal: Soft. Bowel sounds are normal. There is no tenderness.  Musculoskeletal: She exhibits no edema.  Neurological: She is alert and oriented to person, place, and time.  Skin: Skin is warm and dry.  Psychiatric: She has a normal mood and affect.  Nursing note and vitals reviewed.   BP 120/68 mmHg  Pulse 72  Temp(Src) 98 F (36.7 C) (Oral)  Ht 5\' 4"  (1.626 m)  Wt 264 lb 4 oz (119.863 kg)  BMI 45.34 kg/m2  SpO2 94% Wt Readings from Last 3 Encounters:  07/14/15 264 lb 4 oz (119.863 kg)  07/06/15 258 lb 2 oz (117.085 kg)  06/30/15 259 lb (117.482 kg)     Lab Results  Component Value Date   WBC 6.1 07/14/2015   HGB 10.7* 07/14/2015   HCT 32.8* 07/14/2015   PLT 154.0 07/14/2015   GLUCOSE 105* 07/14/2015   CHOL 148 07/14/2015   TRIG 195.0* 07/14/2015   HDL 40.30 07/14/2015   LDLDIRECT 64.8 05/11/2014   LDLCALC 68 07/14/2015   ALT 14 07/14/2015   AST 20 07/14/2015   NA 140 07/14/2015   K 4.2 07/14/2015   CL 99 07/14/2015   CREATININE 2.65* 07/14/2015   BUN 48* 07/14/2015   CO2 34* 07/14/2015   TSH 5.415* 05/18/2015   INR 2.1 06/15/2015   HGBA1C 7.2* 09/26/2014   MICROALBUR 77.0* 03/28/2011    Lab Results  Component Value Date   TSH 5.415* 05/18/2015   Lab Results  Component Value Date   WBC 6.1 07/14/2015   HGB 10.7* 07/14/2015   HCT 32.8* 07/14/2015   MCV 90.3  07/14/2015   PLT 154.0 07/14/2015   Lab Results  Component Value Date   NA 140 07/14/2015   K 4.2 07/14/2015   CO2 34* 07/14/2015   GLUCOSE 105* 07/14/2015   BUN 48* 07/14/2015   CREATININE 2.65* 07/14/2015   BILITOT 0.5 07/14/2015   ALKPHOS 38* 07/14/2015   AST 20 07/14/2015   ALT 14  07/14/2015   PROT 6.9 07/14/2015   ALBUMIN 3.9 07/14/2015   CALCIUM 9.2 07/14/2015   ANIONGAP 9 05/18/2015   GFR 18.77* 07/14/2015   Lab Results  Component Value Date   CHOL 148 07/14/2015   Lab Results  Component Value Date   HDL 40.30 07/14/2015   Lab Results  Component Value Date   LDLCALC 68 07/14/2015   Lab Results  Component Value Date   TRIG 195.0* 07/14/2015   Lab Results  Component Value Date   CHOLHDL 4 07/14/2015   Lab Results  Component Value Date   HGBA1C 7.2* 09/26/2014       Assessment & Plan:   Problem List Items Addressed This Visit    CKD (chronic kidney disease) stage 3, GFR 30-59 ml/min    Continues to follow with [facility], is stable,       Diabetes mellitus type 2 with retinopathy ([place]) - Primary    Currently in AM Novolin N is 30 units in am and 10 units in pm Novolin R is 10 units in am and 10 units in pm most days. Sugars ranging from 90s in am, does not check sugars frequently in pm unless concerned calorie intake, often after overindulging it is no higher than 250 Asymptomatic feeling well      Relevant Orders   Comprehensive metabolic panel (Completed)   CBC with Differential (Completed)   Lipid panel (Completed)   Essential hypertension    Well controlled, no changes to meds. Encouraged heart healthy diet such as the DASH diet and exercise as tolerated.       Morbid obesity (HCC) (Chronic)    Encouraged DASH diet, decrease po intake and increase exercise as tolerated. Needs 7-8 hours of sleep nightly. Avoid trans fats, eat small, frequent meals every 4-5 hours with lean proteins, complex carbs and healthy fats. Minimize simple carbs      Type 2 diabetes mellitus, controlled, with renal complications (HCC) (Chronic)    Follows with Dr Chalmers Cater, hgba1c acceptable, minimize simple carbs. Increase exercise as tolerated. Continue current meds      Vitamin D deficiency    Continue vitamin D supplementation       Other Visit  Diagnoses    Hyperlipidemia, mixed        Relevant Orders    Comprehensive metabolic panel (Completed)    CBC with Differential (Completed)    Lipid panel (Completed)       I am having Ms. Bartell maintain her albuterol, insulin NPH Human, insulin regular, glucose blood, NON FORMULARY, Vitamin D (Ergocalciferol), fluticasone, acetaminophen, fenofibrate, carvedilol, gabapentin, LORazepam, amiodarone, torsemide, atorvastatin, and warfarin.  No orders of the defined types were placed in this encounter.     Penni Homans, MD

## 2015-07-24 NOTE — Assessment & Plan Note (Signed)
Encouraged DASH diet, decrease po intake and increase exercise as tolerated. Needs 7-8 hours of sleep nightly. Avoid trans fats, eat small, frequent meals every 4-5 hours with lean proteins, complex carbs and healthy fats. Minimize simple carbs 

## 2015-07-27 ENCOUNTER — Ambulatory Visit (INDEPENDENT_AMBULATORY_CARE_PROVIDER_SITE_OTHER): Payer: Medicare Other | Admitting: Surgery

## 2015-07-27 ENCOUNTER — Ambulatory Visit (INDEPENDENT_AMBULATORY_CARE_PROVIDER_SITE_OTHER): Payer: Medicare Other

## 2015-07-27 DIAGNOSIS — I482 Chronic atrial fibrillation, unspecified: Secondary | ICD-10-CM

## 2015-07-27 DIAGNOSIS — I5042 Chronic combined systolic (congestive) and diastolic (congestive) heart failure: Secondary | ICD-10-CM | POA: Diagnosis not present

## 2015-07-27 DIAGNOSIS — Z5181 Encounter for therapeutic drug level monitoring: Secondary | ICD-10-CM | POA: Diagnosis not present

## 2015-07-27 DIAGNOSIS — Z95810 Presence of automatic (implantable) cardiac defibrillator: Secondary | ICD-10-CM | POA: Diagnosis not present

## 2015-07-27 DIAGNOSIS — I6350 Cerebral infarction due to unspecified occlusion or stenosis of unspecified cerebral artery: Secondary | ICD-10-CM | POA: Diagnosis not present

## 2015-07-27 DIAGNOSIS — I4891 Unspecified atrial fibrillation: Secondary | ICD-10-CM | POA: Diagnosis not present

## 2015-07-27 DIAGNOSIS — I480 Paroxysmal atrial fibrillation: Secondary | ICD-10-CM

## 2015-07-27 LAB — POCT INR: INR: 2.5

## 2015-07-27 NOTE — Progress Notes (Signed)
EPIC Encounter for ICM Monitoring  Patient Name: Destiny [name] is a 75 y.o. female Date: 07/27/2015 Primary Care Physican: Penni Homans, MD Primary Cardiologist: [place]  Electrophysiologist: Druscilla Brownie Weight: 261 lbs   Bi-V Pacing >99%      In the past month, have you:  1. Gained more than 2 pounds in a day or more than 5 pounds in a week? no  2. Had changes in your medications (with verification of current medications)? no  3. Had more shortness of breath than is usual for you? no  4. Limited your activity because of shortness of breath? no  5. Not been able to sleep because of shortness of breath? no  6. Had increased swelling in your feet or ankles? no  7. Had symptoms of dehydration (dizziness, dry mouth, increased thirst, decreased urine output) no  8. Had changes in sodium restriction? no  9. Been compliant with medication? Yes   ICM trend: 3 month view for 07/27/2015   ICM trend: 1 year view for 07/27/2015   Follow-up plan: ICM clinic phone appointment on 08/30/2015.  Thoracic impedance below reference line from 07/24/2015 to 07/26/2015 suggesting fluid accumulation.  Patient has symptoms of weight gain.  She stated she took additional Torsemide x 1 day with decrease in weight of 2 lbs.  She stated she feels fine today.  Thoracici impedance returned to reference line on 07/26/2015.   Encouraged to call for any fluid symptoms.  No changes today.    [name], RN, CCM 07/27/2015 1:59 PM

## 2015-08-03 ENCOUNTER — Other Ambulatory Visit: Payer: Self-pay | Admitting: Family Medicine

## 2015-08-10 ENCOUNTER — Telehealth: Payer: Self-pay | Admitting: *Deleted

## 2015-08-10 NOTE — Telephone Encounter (Signed)
Received CMN for Oxygen; forwarded to provider/SLS 03/29

## 2015-08-15 ENCOUNTER — Telehealth: Payer: Self-pay | Admitting: Family Medicine

## 2015-08-15 NOTE — Telephone Encounter (Signed)
Called [employer] and they refaxed form, completed requested items over the phone.  Then refaxed back to [place] at [phone]. Spoke to [place] at [place].

## 2015-08-15 NOTE — Telephone Encounter (Signed)
Reason for call: Pt asked that we call [employer] ph# [phone] about getting her portable oxygen. She is trying to get a different system and they told her we need to complete paperwork. What was returned was incomplete. Pt has been talking with Rodena Piety at [facility].

## 2015-08-15 NOTE — Telephone Encounter (Signed)
Received CMN; forwarded to provider [may be duplicate of [id number] fax/SLS 04/03

## 2015-08-25 DIAGNOSIS — E113293 Type 2 diabetes mellitus with mild nonproliferative diabetic retinopathy without macular edema, bilateral: Secondary | ICD-10-CM | POA: Diagnosis not present

## 2015-08-25 DIAGNOSIS — Z0100 Encounter for examination of eyes and vision without abnormal findings: Secondary | ICD-10-CM | POA: Diagnosis not present

## 2015-08-25 DIAGNOSIS — Z961 Presence of intraocular lens: Secondary | ICD-10-CM | POA: Diagnosis not present

## 2015-08-25 LAB — HM DIABETES EYE EXAM

## 2015-08-26 ENCOUNTER — Other Ambulatory Visit (HOSPITAL_COMMUNITY): Payer: Self-pay | Admitting: Internal Medicine

## 2015-08-30 ENCOUNTER — Telehealth: Payer: Self-pay | Admitting: *Deleted

## 2015-08-30 ENCOUNTER — Ambulatory Visit (INDEPENDENT_AMBULATORY_CARE_PROVIDER_SITE_OTHER): Payer: Medicare Other

## 2015-08-30 ENCOUNTER — Encounter: Payer: Self-pay | Admitting: Family Medicine

## 2015-08-30 DIAGNOSIS — Z95810 Presence of automatic (implantable) cardiac defibrillator: Secondary | ICD-10-CM | POA: Diagnosis not present

## 2015-08-30 DIAGNOSIS — I5042 Chronic combined systolic (congestive) and diastolic (congestive) heart failure: Secondary | ICD-10-CM

## 2015-08-30 NOTE — Progress Notes (Signed)
EPIC Encounter for ICM Monitoring  Patient Name: Destiny [name] is a 75 y.o. female Date: 08/30/2015 Primary Care Physican: Penni Homans, MD Primary Cardiologist: [place] Electrophysiologist: Druscilla Brownie Weight: 262 lbs   Bi-V Pacing >99%      In the past month, have you:  1. Gained more than 2 pounds in a day or more than 5 pounds in a week? Yes, 2 lbs in the last 24 hours  2. Had changes in your medications (with verification of current medications)? no  3. Had more shortness of breath than is usual for you? no  4. Limited your activity because of shortness of breath? no  5. Not been able to sleep because of shortness of breath? no  6. Had increased swelling in your feet or ankles? no  7. Had symptoms of dehydration (dizziness, dry mouth, increased thirst, decreased urine output) no  8. Had changes in sodium restriction? no  9. Been compliant with medication? Yes   ICM trend: 3 month view for 08/30/2015   ICM trend: 1 year view for 08/30/2015   Follow-up plan: ICM clinic phone appointment on 09/30/2015.  Thoracic impedance below above reference line from 07/31/2015 to 08/07/2015 and 08/22/2015 to 08/26/2015 suggesting fluid accumulation.  Patient has fluid symptoms of weight gain.  Thoracic impedance returned to reference line 08/27/2015.  Reviewed importance of low sodium diet and limit intake to < 2000 mg and fluid intake to 64 oz daily.  She reported she took extra Torsemide today for the weight gain of 2 lbs. Encouraged to call for any fluid symptoms.  No changes today.          Patient manages fluid symptoms by taking extra Torsemide dosage.    [name], RN, CCM 08/30/2015 2:20 PM

## 2015-08-30 NOTE — Telephone Encounter (Signed)
Received orders for CMN Oxygen; forwarded to provider/SLS 04/18

## 2015-09-07 ENCOUNTER — Ambulatory Visit (INDEPENDENT_AMBULATORY_CARE_PROVIDER_SITE_OTHER): Payer: Medicare Other | Admitting: *Deleted

## 2015-09-07 DIAGNOSIS — I482 Chronic atrial fibrillation, unspecified: Secondary | ICD-10-CM

## 2015-09-07 DIAGNOSIS — I4891 Unspecified atrial fibrillation: Secondary | ICD-10-CM

## 2015-09-07 DIAGNOSIS — I480 Paroxysmal atrial fibrillation: Secondary | ICD-10-CM

## 2015-09-07 DIAGNOSIS — I6350 Cerebral infarction due to unspecified occlusion or stenosis of unspecified cerebral artery: Secondary | ICD-10-CM | POA: Diagnosis not present

## 2015-09-07 DIAGNOSIS — Z5181 Encounter for therapeutic drug level monitoring: Secondary | ICD-10-CM | POA: Diagnosis not present

## 2015-09-07 LAB — POCT INR: INR: 3.1

## 2015-09-20 DIAGNOSIS — I10 Essential (primary) hypertension: Secondary | ICD-10-CM | POA: Diagnosis not present

## 2015-09-20 DIAGNOSIS — E1165 Type 2 diabetes mellitus with hyperglycemia: Secondary | ICD-10-CM | POA: Diagnosis not present

## 2015-09-20 DIAGNOSIS — E7800 Pure hypercholesterolemia, unspecified: Secondary | ICD-10-CM | POA: Diagnosis not present

## 2015-09-20 DIAGNOSIS — E039 Hypothyroidism, unspecified: Secondary | ICD-10-CM | POA: Diagnosis not present

## 2015-09-26 ENCOUNTER — Telehealth: Payer: Self-pay

## 2015-09-26 ENCOUNTER — Ambulatory Visit (INDEPENDENT_AMBULATORY_CARE_PROVIDER_SITE_OTHER): Payer: Medicare Other

## 2015-09-26 DIAGNOSIS — Z95810 Presence of automatic (implantable) cardiac defibrillator: Secondary | ICD-10-CM

## 2015-09-26 DIAGNOSIS — I5042 Chronic combined systolic (congestive) and diastolic (congestive) heart failure: Secondary | ICD-10-CM

## 2015-09-26 NOTE — Progress Notes (Signed)
EPIC Encounter for ICM Monitoring  Patient Name: Destiny [name] is a 75 y.o. female Date: 09/26/2015 Primary Care Physican: Penni Homans, MD Primary Cardiologist: [place] Electrophysiologist: Druscilla Brownie Weight: 264.2 lbs   Bi-V Pacing >99%      In the past month, have you:  1. Gained more than 2 pounds in a day or more than 5 pounds in a week? Yes, fluctuates by 3-5 pound gain since 09/13/2015.  Her baseline is is 260 lbs  2. Had changes in your medications (with verification of current medications)? no  3. Had more shortness of breath than is usual for you? no  4. Limited your activity because of shortness of breath? no  5. Not been able to sleep because of shortness of breath? no  6. Had increased swelling in your feet, ankles, legs or stomach area? no  7. Had symptoms of dehydration (dizziness, dry mouth, increased thirst, decreased urine output) no  8. Had changes in sodium restriction? no  9. Been compliant with medication? Yes  ICM trend: 3 month view for 09/26/2015   ICM trend: 1 year view for 09/26/2015   Follow-up plan: ICM clinic phone appointment 09/30/2015.  Received call from patient stating she was gaining weight and not feeling well.  She currently has congestion, possibly infection.   FLUID LEVELS:  [place] thoracic impedance decreased 09/18/2015 to 09/26/2015 suggesting fluid accumulation.    SYMPTOMS:   She reported symptom such as weight gain of > 3 pounds and has been up to 5 pound gain since 09/13/2015.  She denied other fluid symptoms.  She has has increased Torsemide 20 mg by 1 tablet for several days within the last 2 weeks and her urine output has not increased.    She thinks the Torsemide is not effective right now.    LABS:  07/14/2015 Creatinine 2.65, BUN 48, Potassium 4.2, Sodium 140, eGFR 18.77 05/18/2015 Creatinine 3.00, BUN 53, Potassium 4.3, Sodium 140 03/12/2015 Creatinine 2.70, BUN 43, Potassium 4.2, Sodium 134  Advised will send to PCP, Dr  Haroldine Laws and Dr Lovena Le for review for weight gain and decreased thoracic impedance.  If any recommendations, will call back.     [name], RN, CCM 09/26/2015 12:08 PM

## 2015-09-26 NOTE — Telephone Encounter (Signed)
Return call to patient after receiving voice mail to call her regarding weight gain.  Spoke with patient.  She stated she has gained approximately 5 pounds since since 09/13/2015 despite taking extra Torsemide.  Requested she send a transmission today for review.

## 2015-09-28 NOTE — Progress Notes (Signed)
Reviewed transmission and weight gain symptom with Dr Lovena Le in the office today.   He ordered patient to increase Torsemide 20 mg to 3 tablets am and 2 tablets pm x 3 days only.  If she returns to baseline weight before the 3 days she can resume her prescribed dosage of Torsemide 20 mg of 2 tablets am and 1 tablet in pm.  She verbalized understanding.  She stated she had lost 1 lb yesterday but regained again overnight.  Advised if symptoms worsen to call back or to use the local emergency number or ER.  Repeat transmission 10/03/2015.

## 2015-10-03 ENCOUNTER — Ambulatory Visit (INDEPENDENT_AMBULATORY_CARE_PROVIDER_SITE_OTHER): Payer: Medicare Other

## 2015-10-03 ENCOUNTER — Encounter: Payer: Self-pay | Admitting: Family Medicine

## 2015-10-03 ENCOUNTER — Ambulatory Visit (INDEPENDENT_AMBULATORY_CARE_PROVIDER_SITE_OTHER): Payer: Medicare Other | Admitting: Family Medicine

## 2015-10-03 VITALS — BP 110/60 | HR 71 | Temp 98.0°F | Ht 64.0 in | Wt 264.2 lb

## 2015-10-03 DIAGNOSIS — I5042 Chronic combined systolic (congestive) and diastolic (congestive) heart failure: Secondary | ICD-10-CM

## 2015-10-03 DIAGNOSIS — T485X1A Poisoning by other anti-common-cold drugs, accidental (unintentional), initial encounter: Secondary | ICD-10-CM

## 2015-10-03 DIAGNOSIS — J310 Chronic rhinitis: Secondary | ICD-10-CM | POA: Diagnosis not present

## 2015-10-03 DIAGNOSIS — R0981 Nasal congestion: Secondary | ICD-10-CM | POA: Diagnosis not present

## 2015-10-03 DIAGNOSIS — I6350 Cerebral infarction due to unspecified occlusion or stenosis of unspecified cerebral artery: Secondary | ICD-10-CM

## 2015-10-03 DIAGNOSIS — T485X5A Adverse effect of other anti-common-cold drugs, initial encounter: Secondary | ICD-10-CM

## 2015-10-03 DIAGNOSIS — Z95810 Presence of automatic (implantable) cardiac defibrillator: Secondary | ICD-10-CM

## 2015-10-03 MED ORDER — MONTELUKAST SODIUM 10 MG PO TABS: 10.0000 mg | ORAL_TABLET | Freq: Every day | ORAL | Status: DC

## 2015-10-03 MED ORDER — LORAZEPAM 0.5 MG PO TABS: 0.5000 mg | ORAL_TABLET | Freq: Every day | ORAL | Status: DC

## 2015-10-03 NOTE — Progress Notes (Signed)
[place] at [facility] [address] [place], [place] [address] [phone]  Date:  10/03/2015   Name:  MADDELINE TAVERAS   DOB:  1941/03/17   MRN:  [id number]  PCP:  Penni Homans, MD    Chief Complaint: Ear Fullness   History of Present Illness:  Destiny [name] is a 75 y.o. very pleasant female patient who presents with the following:  Here today with complaint of sinus congestion. She tried treating this with mucinex. She then noted that her right ear seems to be stopped up for the last few days. The left ear is fine.  No pain No fever  She uses [place] oxygen at baseline No new lung symptoms or change in her baseline general health. No chest congestion No sore throat Sx are only in her sinuses and nose  Her nose feels "sore on the inside."   She sees Dr. Chalmers Cater for her DM Appetite is ok  She has noted these sx for about one week now.  She uses an OTC allergy medication- antihistamine of some sort. She has been using afrin and fluticasone nasal sprays.  Admits she has been on the afrin for about a week now BP Readings from Last 3 Encounters:  10/03/15 118/54  07/14/15 120/68  07/06/15 131/59   Lab Results  Component Value Date   HGBA1C 7.2* 09/26/2014     Patient Active Problem List   Diagnosis Date Noted  . CHF exacerbation ([place]) 03/12/2015  . Chronic respiratory failure ([place]) 10/25/2014  . Hypoglycemia 10/25/2014  . Medicare annual wellness visit, subsequent 09/12/2014  . Coagulopathy ([place]) 07/29/2014  . Morbid obesity ([place]) 07/29/2014  . Type 2 diabetes mellitus, controlled, with renal complications ([place]) [id number]  . Chronic combined systolic and diastolic CHF (congestive heart failure) ([place]) 07/28/2014  . Anemia 12/13/2013  . DOE (dyspnea on exertion) 11/30/2013  . Edema 09/26/2013  . Hypercalcemia 09/26/2013  . Encounter for therapeutic drug monitoring 06/10/2013  . Chronic combined systolic and diastolic  heart failure ([place]) 05/01/2013  . Physical deconditioning 03/25/2013  . Hypothyroidism 03/06/2013  . Olecranon bursitis of right elbow 12/29/2012  . Vitamin D deficiency 06/09/2012  . CKD (chronic kidney disease) stage 3, GFR 30-59 ml/min 12/27/2011  . History of Cardiomyopathy, ischemic   . OSA (obstructive sleep apnea) 09/05/2010  . LEG PAIN, BILATERAL 12/28/2009  . Diabetes mellitus type 2 with retinopathy ([place]) 10/28/2009  . ATRIAL FIBRILLATION, PAROXYSMAL 06/21/2009  . NHL (non-Hodgkin's lymphoma) ([place]) 04/21/2009  . MURAL THROMBUS, LEFT VENTRICLE 03/07/2009  . LYMPHEDEMA 03/07/2009  . LBBB 08/26/2008  . CVA 08/26/2008  . BACK PAIN, CHRONIC 08/26/2008  . Automatic implantable cardioverter-defibrillator in situ 08/26/2008  . UTI'S, RECURRENT 06/24/2008  . CAROTID BRUIT 05/20/2007  . Hyperlipidemia 03/19/2007  . Essential hypertension 11/20/2006    Past Medical History  Diagnosis Date  . LBBB (left bundle branch block)   . Carotid stenosis     a. 10/19/2011 carotid duplex - Mild hard plaque bilaterally. Stable 40-59% bilateral ICA stenosis. Carotid US (04/2013):  Bilateral 40-59% ICA.  F/u 1 year  . Dyslipidemia   . Hypertension   . Chronic combined systolic and diastolic CHF (congestive heart failure) (HCC)     a. EF as low as 25% in 2006;  b. EF 60-65% in 12/2011;  c. 02/2013 Echo: EF 35-40, mod mid-dist antsept HK, Gr 2 DD, mild LVH.  . Back pain, chronic     "just  when I walk; mass on 3rd and 4th vertebrae right lower back"  . Cardiomyopathy, ischemic     a. 2012 s/p SJM 3231-40 Uni BiV ICD, ser # [id number].  Marland Kitchen CAD (coronary artery disease)     a. 05/2001 CABG x4: LIMA->LAD, VG->D1, VG->D2, VG->RCA.  Marland Kitchen Retinopathy due to secondary diabetes ([place])     type II, uncontrolled  . CKD (chronic kidney disease), stage III   . Biventricular implantable cardiac defibrillator in situ 2007, 2012    a. 2007;  b. 2012 Gen change: SJM 3231-40 Uni BiV ICD, ser # [id number].  Marland Kitchen Hypoxemia  requiring supplemental oxygen   . Hypothyroidism   . 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 Benay Spice, in remission  . OSA on CPAP   . Gout ~ 08/2011  . PAF (paroxysmal atrial fibrillation) (HCC)     on Coumadin  . DM (diabetes mellitus), type 2 with complications (HCC)     insulin dependent, retinopathy, neuropathy  . Mural thrombus of left ventricle (HCC)     before 2003, while on Coumadin, No h/o CVA  . Morbid obesity with BMI of 45.0-49.9, adult (HCC)     Ht. 5'4". BMI 47.2  . Vitamin D deficiency 06/09/2012    historical  . Hypercalcemia 09/26/2013  . Anemia, unspecified 12/13/2013  . Presence of permanent cardiac pacemaker   . AICD (automatic cardioverter/defibrillator) present     Past Surgical History  Procedure Laterality Date  . Cardiac catheterization  06/03/01  . Cholecystectomy  1970  . Tubal ligation  1972  . Abdominal hysterectomy  1982  . Insert / replace / remove pacemaker  2007; 2012    w/AICD  . Coronary artery bypass graft  2003    CABG X4  . Cataract extraction      left eye  . Cardioversion N/A 07/30/2014    Procedure: CARDIOVERSION;  Surgeon: Jolaine Artist, MD;  Location: [facility] ENDOSCOPY;  Service: Cardiovascular;  Laterality: N/A;  . Refractive surgery      right eye  . Cardioversion N/A 11/25/2014    Procedure: CARDIOVERSION;  Surgeon: Evans Lance, MD;  Location: [facility] ENDOSCOPY;  Service: Cardiovascular;  Laterality: N/A;    Social History  Substance Use Topics  . Smoking status: Never Smoker   . Smokeless tobacco: Never Used  . Alcohol Use: No    Family History  Problem Relation Age of Onset  . Kidney cancer Mother     kidney and female repo - died @ 70  . Asthma Mother   . Cancer Mother     gyn and renal  . Heart disease Mother     CAD  . Heart disease Father     died @ 30  . Stroke Father   . Diabetes Father   . Hypertension Father   . Hyperlipidemia Father   . Hypertension Son   .  Hypertension Maternal Aunt   . Kidney cancer Maternal Uncle   . Cancer Maternal Uncle   . Cirrhosis Maternal Grandmother     non alcohol  . Cancer Maternal Grandfather   . Kidney cancer Maternal Grandfather   . Heart attack Father     Allergies  Allergen Reactions  . Sulfonamide Derivatives Other (See Comments)    Unknown; "childhood allergy/mother"    Medication list has been reviewed and updated.  Current Outpatient Prescriptions on File Prior to Visit  Medication Sig Dispense Refill  . acetaminophen (TYLENOL) 500 MG tablet  Take 1,000 mg by mouth every 6 (six) hours as needed (pain).    Marland Kitchen albuterol (PROVENTIL HFA;VENTOLIN HFA) 108 (90 BASE) MCG/ACT inhaler Inhale 2 puffs into the lungs every 6 (six) hours as needed for wheezing or shortness of breath. 1 Inhaler 1  . amiodarone (PACERONE) 200 MG tablet TAKE 1 TABLET TWICE DAILY 180 tablet 2  . atorvastatin (LIPITOR) 20 MG tablet TAKE 1 TABLET EVERY DAY 90 tablet 1  . carvedilol (COREG) 25 MG tablet TAKE 2 TABLETS TWICE DAILY 360 tablet 3  . escitalopram (LEXAPRO) 10 MG tablet TAKE 1 TABLET ONE TIME DAILY 90 tablet 1  . fenofibrate (TRICOR) 48 MG tablet Take 1 tablet (48 mg total) by mouth daily. 90 tablet 3  . fluticasone (FLONASE) 50 MCG/ACT nasal spray Place 2 sprays into both nostrils daily as needed for allergies or rhinitis. As needed for nasal stuffiness. 16 g 0  . gabapentin (NEURONTIN) 300 MG capsule TAKE 1 CAPSULE TWICE DAILY 180 capsule 1  . glucose blood (FREESTYLE LITE) test strip DX: 250.60  Check sugars bid and as needed 100 each 2  . insulin NPH (HUMULIN N,NOVOLIN N) 100 UNIT/ML injection Inject 20-45 Units into the skin 2 (two) times daily before a meal. Take 30 units in the morning and 10 units in the evening    . insulin regular (NOVOLIN R,HUMULIN R) 100 units/mL injection Change to 15 units three times a with meals (to cover all meals--this is the same total amount but spread out) (Patient taking differently:  Inject 15 Units into the skin 2 (two) times daily before a meal. Change to 15 units three times a with meals (to cover all meals--this is the same total amount but spread out)) 10 mL 12  . levothyroxine (SYNTHROID, LEVOTHROID) 25 MCG tablet TAKE 1 TABLET EVERY DAY BEFORE BREAKFAST 90 tablet 1  . LORazepam (ATIVAN) 0.5 MG tablet Take 1 tablet (0.5 mg total) by mouth at bedtime as needed for anxiety. (Patient taking differently: Take 0.5 mg by mouth at bedtime. ) 90 tablet 1  . NON FORMULARY Oxygen---24 hour    . torsemide (DEMADEX) 20 MG tablet TAKE 2 TABLETS EVERY MORNING, TAKE 1 TABLET IN THE AFTERNOON AND MAY TAKE AN EXTRA TABLET WITH A 3 POUND WEIGHT GAIN. 360 tablet 1  . Vitamin D, Ergocalciferol, (DRISDOL) 50000 UNITS CAPS capsule Take 50,000 Units by mouth every 7 (seven) days. Tuesdays    . warfarin (COUMADIN) 5 MG tablet TAKE AS DIRECTED BY ANTICOAGULATION CLINIC. 70 tablet 1   No current facility-administered medications on file prior to visit.    Review of Systems:  As per HPI- otherwise negative.   Physical Examination: Filed Vitals:   10/03/15 1309  BP: 118/54  Pulse: 71  Temp: 98 F (36.7 C)   Filed Vitals:   10/03/15 1309  Height: 5\' 4"  (1.626 m)  Weight: 264 lb 3.2 oz (119.84 kg)   Body mass index is 45.33 kg/(m^2). Ideal Body Weight: Weight in (lb) to have BMI = 25: 145.3  GEN: WDWN, NAD, Non-toxic, A & O x 3, obese, uses a scooter. Oxygen via [place] HEENT: Atraumatic, Normocephalic. Neck supple. No masses, No LAD.  Bilateral TM wnl, oropharynx normal.  PEERL,EOMI.  No cerumen impaction or sign of ear infection Ears and Nose: No external deformity. CV: RRR, No M/G/R. No JVD. No thrill. No extra heart sounds. PULM: CTA B, no wheezes, crackles, rhonchi. No retractions. No resp. distress. No accessory muscle use. EXTR: No c/c/e NEURO gait  not observed as she is in a scooter PSYCH: Normally interactive. Conversant. Not depressed or anxious appearing.  Calm demeanor.     Assessment and Plan: Nasal congestion - Plan: montelukast (SINGULAIR) 10 MG tablet  Rhinitis medicamentosa   Reassurance- they wondered about oral steroids or abx but counseled that these are not necessary or advisable at this time. Will add singulair to her regimen for 2-3 weeks as needed Counseled that she does need to stop using afrin as this can worsen nasal congestion  They will let me know if she is not feeling better in the next several days- Sooner if worse.     Signed Lamar Blinks, MD

## 2015-10-03 NOTE — Patient Instructions (Signed)
It was good to see you today!  I do not see any sign of infection in your ear.   Add the singulair to your regimen- once a day. You can continue your steroid nasal spray but please come off the afrin.  Let me know if you are not feeling better in the next few days- Sooner if worse.

## 2015-10-04 NOTE — Progress Notes (Signed)
From: Jolaine Artist, MD    Sent: 10/01/2015  10:54 PM      To: [name], RN Subject: RE: [place] Fluid levels and weight gain        Lets try metolazone 2.5 mg x 1 with kcl 20.  Thanks   No metolazone given due to fluid levels and weight gain were addressed by Dr Lovena Le on 09/28/2015.

## 2015-10-04 NOTE — Progress Notes (Signed)
EPIC Encounter for ICM Monitoring  Patient Name: Destiny [name] is a 75 y.o. female Date: 10/04/2015 Primary Care Physican: Penni Homans, MD Primary Cardiologist: Carrick Electrophysiologist: Druscilla Brownie Weight: 261.6 lbs   Bi-V Pacing >99%      In the past month, have you:  1. Gained more than 2 pounds in a day or more than 5 pounds in a week? She lost fluid weight and back to baseline of 261 lbs.  2. Had changes in your medications (with verification of current medications)? no  3. Had more shortness of breath than is usual for you? no  4. Limited your activity because of shortness of breath? no  5. Not been able to sleep because of shortness of breath? no  6. Had increased swelling in your feet, ankles, legs or stomach area? no  7. Had symptoms of dehydration (dizziness, dry mouth, increased thirst, decreased urine output) no  8. Had changes in sodium restriction? no  9. Been compliant with medication? Yes  ICM trend: 3 month view for 10/03/2015   ICM trend: 1 year view for 10/03/2015   Follow-up plan: ICM clinic phone appointment 11/04/2015.    FLUID LEVELS: Since 09/26/2015 transmission, [place] thoracic impedance returned to baseline on 09/28/2015 and above reference line stating 09/30/2015 which correlates with increase in Torsemide x 3 days.     SYMPTOMS:  She reported she feels much better since losing the fluid weight.  She is back to her baseline weight and she breathes easier.   Encouraged to call for any fluid symptoms.   RECOMMENDATIONS: No changes today.    Copy of updated transmission sent to Dr Haroldine Laws and Dr Lovena Le showing improved thoracic impedance.      [name], RN, CCM 10/04/2015 10:45 AM

## 2015-10-12 ENCOUNTER — Ambulatory Visit (INDEPENDENT_AMBULATORY_CARE_PROVIDER_SITE_OTHER): Payer: Medicare Other | Admitting: *Deleted

## 2015-10-12 DIAGNOSIS — I482 Chronic atrial fibrillation, unspecified: Secondary | ICD-10-CM

## 2015-10-12 DIAGNOSIS — I4891 Unspecified atrial fibrillation: Secondary | ICD-10-CM | POA: Diagnosis not present

## 2015-10-12 DIAGNOSIS — Z5181 Encounter for therapeutic drug level monitoring: Secondary | ICD-10-CM | POA: Diagnosis not present

## 2015-10-12 DIAGNOSIS — I480 Paroxysmal atrial fibrillation: Secondary | ICD-10-CM

## 2015-10-12 DIAGNOSIS — I6350 Cerebral infarction due to unspecified occlusion or stenosis of unspecified cerebral artery: Secondary | ICD-10-CM | POA: Diagnosis not present

## 2015-10-12 LAB — POCT INR: INR: 3.1

## 2015-10-13 ENCOUNTER — Other Ambulatory Visit: Payer: Self-pay | Admitting: Family Medicine

## 2015-10-18 ENCOUNTER — Ambulatory Visit (INDEPENDENT_AMBULATORY_CARE_PROVIDER_SITE_OTHER): Payer: Medicare Other | Admitting: Family Medicine

## 2015-10-18 ENCOUNTER — Encounter: Payer: Self-pay | Admitting: Family Medicine

## 2015-10-18 DIAGNOSIS — E039 Hypothyroidism, unspecified: Secondary | ICD-10-CM | POA: Diagnosis not present

## 2015-10-18 DIAGNOSIS — I10 Essential (primary) hypertension: Secondary | ICD-10-CM | POA: Diagnosis not present

## 2015-10-18 DIAGNOSIS — I6350 Cerebral infarction due to unspecified occlusion or stenosis of unspecified cerebral artery: Secondary | ICD-10-CM | POA: Diagnosis not present

## 2015-10-18 DIAGNOSIS — E785 Hyperlipidemia, unspecified: Secondary | ICD-10-CM | POA: Diagnosis not present

## 2015-10-18 DIAGNOSIS — H6120 Impacted cerumen, unspecified ear: Secondary | ICD-10-CM

## 2015-10-18 MED ORDER — PREDNISONE 10 MG PO TABS: ORAL_TABLET | ORAL | Status: DC

## 2015-10-18 MED ORDER — MECLIZINE HCL 25 MG PO TABS: 25.0000 mg | ORAL_TABLET | Freq: Three times a day (TID) | ORAL | Status: DC | PRN

## 2015-10-18 NOTE — Patient Instructions (Signed)

## 2015-10-18 NOTE — Progress Notes (Signed)
Pre visit review using our clinic review tool, if applicable. No additional management support is needed unless otherwise documented below in the visit note. 

## 2015-10-24 ENCOUNTER — Ambulatory Visit (INDEPENDENT_AMBULATORY_CARE_PROVIDER_SITE_OTHER): Payer: Medicare Other | Admitting: *Deleted

## 2015-10-24 DIAGNOSIS — I482 Chronic atrial fibrillation, unspecified: Secondary | ICD-10-CM

## 2015-10-24 DIAGNOSIS — Z5181 Encounter for therapeutic drug level monitoring: Secondary | ICD-10-CM | POA: Diagnosis not present

## 2015-10-24 DIAGNOSIS — I6350 Cerebral infarction due to unspecified occlusion or stenosis of unspecified cerebral artery: Secondary | ICD-10-CM

## 2015-10-24 DIAGNOSIS — I480 Paroxysmal atrial fibrillation: Secondary | ICD-10-CM

## 2015-10-24 DIAGNOSIS — I4891 Unspecified atrial fibrillation: Secondary | ICD-10-CM | POA: Diagnosis not present

## 2015-10-24 LAB — POCT INR: INR: 2.9

## 2015-10-28 ENCOUNTER — Encounter: Payer: Self-pay | Admitting: Family Medicine

## 2015-10-28 DIAGNOSIS — H6120 Impacted cerumen, unspecified ear: Secondary | ICD-10-CM

## 2015-10-28 HISTORY — DX: Impacted cerumen, unspecified ear: H61.20

## 2015-10-28 NOTE — Progress Notes (Signed)
[name], female   DOB: 1940-08-15, 75 y.o.   MRN: [id number]   Subjective:    [name], female    DOB: March 09, 1941, 75 y.o.   MRN: [id number]  Chief Complaint  Patient presents with  . Cerumen Impaction  . Dizziness    HPI Patient is in today for follow up. No recent illness but is struggling with some decreased hearing. No pain or neurologic other complaints. No hospitalization. Denies CP/palp/SOB/HA/congestion/fevers/GI or GU c/o. Taking meds as prescribed  Past Medical History  Diagnosis Date  . LBBB (left bundle branch block)   . Carotid stenosis     a. 10/19/2011 carotid duplex - Mild hard plaque bilaterally. Stable 40-59% bilateral ICA stenosis. Carotid US (04/2013):  Bilateral 40-59% ICA.  F/u 1 year  . Dyslipidemia   . Hypertension   . Chronic combined systolic and diastolic CHF (congestive heart failure) (HCC)     a. EF as low as 25% in 2006;  b. EF 60-65% in 12/2011;  c. 02/2013 Echo: EF 35-40, mod mid-dist antsept HK, Gr 2 DD, mild LVH.  . Back pain, chronic     "just when I walk; mass on 3rd and 4th vertebrae right lower back"  . Cardiomyopathy, ischemic     a. 2012 s/p SJM 3231-40 Uni BiV ICD, ser # [id number].  Marland Kitchen CAD (coronary artery disease)     a. 05/2001 CABG x4: LIMA->LAD, VG->D1, VG->D2, VG->RCA.  Marland Kitchen Retinopathy due to secondary diabetes ([place])     type II, uncontrolled  . CKD (chronic kidney disease), stage III   . Biventricular implantable cardiac defibrillator in situ 2007, 2012    a. 2007;  b. 2012 Gen change: SJM 3231-40 Uni BiV ICD, ser # [id number].  Marland Kitchen Hypoxemia requiring supplemental oxygen   . Hypothyroidism   . 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 Benay Spice, in remission  . OSA on CPAP   . Gout ~ 08/2011  . PAF (paroxysmal atrial fibrillation) (HCC)     on Coumadin  . DM (diabetes mellitus), type 2 with complications (HCC)     insulin dependent, retinopathy, neuropathy  . Mural  thrombus of left ventricle (HCC)     before 2003, while on Coumadin, No h/o CVA  . Morbid obesity with BMI of 45.0-49.9, adult (HCC)     Ht. 5'4". BMI 47.2  . Vitamin D deficiency 06/09/2012    historical  . Hypercalcemia 09/26/2013  . Anemia, unspecified 12/13/2013  . Presence of permanent cardiac pacemaker   . AICD (automatic cardioverter/defibrillator) present   . Cerumen impaction 10/28/2015    Past Surgical History  Procedure Laterality Date  . Cardiac catheterization  06/03/01  . Cholecystectomy  1970  . Tubal ligation  1972  . Abdominal hysterectomy  1982  . Insert / replace / remove pacemaker  2007; 2012    w/AICD  . Coronary artery bypass graft  2003    CABG X4  . Cataract extraction      left eye  . Cardioversion N/A 07/30/2014    Procedure: CARDIOVERSION;  Surgeon: Jolaine Artist, MD;  Location: [facility] ENDOSCOPY;  Service: Cardiovascular;  Laterality: N/A;  . Refractive surgery      right eye  . Cardioversion N/A 11/25/2014    Procedure: CARDIOVERSION;  Surgeon: Evans Lance, MD;  Location: [facility] ENDOSCOPY;  Service: Cardiovascular;  Laterality: N/A;    Family History  Problem Relation Age of Onset  .  Kidney cancer Mother     kidney and female repo - died @ 59  . Asthma Mother   . Cancer Mother     gyn and renal  . Heart disease Mother     CAD  . Heart disease Father     died @ 31  . Stroke Father   . Diabetes Father   . Hypertension Father   . Hyperlipidemia Father   . Hypertension Son   . Hypertension Maternal Aunt   . Kidney cancer Maternal Uncle   . Cancer Maternal Uncle   . Cirrhosis Maternal Grandmother     non alcohol  . Cancer Maternal Grandfather   . Kidney cancer Maternal Grandfather   . Heart attack Father     Social History   Social History  . Marital Status: Married    Spouse Name: N/A  . Number of Children: Y  . Years of Education: N/A   Occupational History  . 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    Social History Main Topics  .  Smoking status: Never Smoker   . Smokeless tobacco: Never Used  . Alcohol Use: No  . Drug Use: No  . Sexual Activity: Not on file     Comment: lives with husband, no major dietary restrictions   Other Topics Concern  . Not on file   Social History Narrative   Lives in [place] with her husband.  She does not routinely exercise or adhere to any particular diet.      Outpatient Prescriptions Prior to Visit  Medication Sig Dispense Refill  . acetaminophen (TYLENOL) 500 MG tablet Take 1,000 mg by mouth every 6 (six) hours as needed (pain).    Marland Kitchen albuterol (PROVENTIL HFA;VENTOLIN HFA) 108 (90 BASE) MCG/ACT inhaler Inhale 2 puffs into the lungs every 6 (six) hours as needed for wheezing or shortness of breath. 1 Inhaler 1  . amiodarone (PACERONE) 200 MG tablet TAKE 1 TABLET TWICE DAILY 180 tablet 2  . atorvastatin (LIPITOR) 20 MG tablet TAKE 1 TABLET EVERY DAY 90 tablet 1  . carvedilol (COREG) 25 MG tablet TAKE 2 TABLETS TWICE DAILY 360 tablet 3  . escitalopram (LEXAPRO) 10 MG tablet TAKE 1 TABLET ONE TIME DAILY 90 tablet 1  . fenofibrate (TRICOR) 48 MG tablet Take 1 tablet (48 mg total) by mouth daily. 90 tablet 3  . fluticasone (FLONASE) 50 MCG/ACT nasal spray Place 2 sprays into both nostrils daily as needed for allergies or rhinitis. As needed for nasal stuffiness. 16 g 0  . gabapentin (NEURONTIN) 300 MG capsule TAKE 1 CAPSULE TWICE DAILY 180 capsule 1  . glucose blood (FREESTYLE LITE) test strip DX: 250.60  Check sugars bid and as needed 100 each 2  . insulin NPH (HUMULIN N,NOVOLIN N) 100 UNIT/ML injection Inject 20-45 Units into the skin 2 (two) times daily before a meal. Take 30 units in the morning and 10 units in the evening    . insulin regular (NOVOLIN R,HUMULIN R) 100 units/mL injection Change to 15 units three times a with meals (to cover all meals--this is the same total amount but spread out) (Patient taking differently: Inject 15 Units into the skin 2 (two) times daily  before a meal. Change to 15 units three times a with meals (to cover all meals--this is the same total amount but spread out)) 10 mL 12  . levothyroxine (SYNTHROID, LEVOTHROID) 25 MCG tablet TAKE 1 TABLET EVERY DAY BEFORE BREAKFAST 90 tablet 1  .  LORazepam (ATIVAN) 0.5 MG tablet Take 1 tablet (0.5 mg total) by mouth at bedtime. 90 tablet 1  . montelukast (SINGULAIR) 10 MG tablet Take 1 tablet (10 mg total) by mouth at bedtime. 30 tablet 0  . NON FORMULARY Oxygen---24 hour    . torsemide (DEMADEX) 20 MG tablet TAKE 2 TABLETS EVERY MORNING, TAKE 1 TABLET IN THE AFTERNOON AND MAY TAKE AN EXTRA TABLET WITH A 3 POUND WEIGHT GAIN. 360 tablet 0  . Vitamin D, Ergocalciferol, (DRISDOL) 50000 UNITS CAPS capsule Take 50,000 Units by mouth every 7 (seven) days. Tuesdays    . warfarin (COUMADIN) 5 MG tablet TAKE AS DIRECTED BY ANTICOAGULATION CLINIC. 70 tablet 1   No facility-administered medications prior to visit.    Allergies  Allergen Reactions  . Sulfonamide Derivatives Other (See Comments)    Unknown; "childhood allergy/mother"    Review of Systems  Constitutional: Positive for malaise/fatigue. Negative for fever.  HENT: Negative for congestion.   Eyes: Negative for blurred vision.  Respiratory: Positive for shortness of breath.   Cardiovascular: Negative for chest pain, palpitations and leg swelling.  Gastrointestinal: Negative for nausea, abdominal pain and blood in stool.  Genitourinary: Negative for dysuria and frequency.  Musculoskeletal: Negative for falls.  Skin: Negative for rash.  Neurological: Negative for dizziness, loss of consciousness and headaches.  Endo/Heme/Allergies: Negative for environmental allergies.  Psychiatric/Behavioral: Negative for depression. The patient is not nervous/anxious.        Objective:    Physical Exam  Constitutional: She is oriented to person, place, and time. She appears well-developed and well-nourished. No distress.  HENT:  Head:  Normocephalic and atraumatic.  Nose: Nose normal.  Eyes: Right eye exhibits no discharge. Left eye exhibits no discharge.  Neck: Normal range of motion. Neck supple.  Cardiovascular: Normal rate and regular rhythm.   Murmur heard. Pulmonary/Chest: Effort normal and breath sounds normal.  Abdominal: Soft. Bowel sounds are normal. There is no tenderness.  Musculoskeletal: She exhibits no edema.  Neurological: She is alert and oriented to person, place, and time.  Skin: Skin is warm and dry.  Psychiatric: She has a normal mood and affect.  Nursing note and vitals reviewed.   BP 118/72 mmHg  Pulse 71  Temp(Src) 98.5 F (36.9 C) (Oral)  Ht 5\' 4"  (1.626 m)  Wt 266 lb 2 oz (120.714 kg)  BMI 45.66 kg/m2  SpO2 91% Wt Readings from Last 3 Encounters:  10/18/15 266 lb 2 oz (120.714 kg)  10/03/15 264 lb 3.2 oz (119.84 kg)  07/14/15 264 lb 4 oz (119.863 kg)     Lab Results  Component Value Date   WBC 6.1 07/14/2015   HGB 10.7* 07/14/2015   HCT 32.8* 07/14/2015   PLT 154.0 07/14/2015   GLUCOSE 105* 07/14/2015   CHOL 148 07/14/2015   TRIG 195.0* 07/14/2015   HDL 40.30 07/14/2015   LDLDIRECT 64.8 05/11/2014   LDLCALC 68 07/14/2015   ALT 14 07/14/2015   AST 20 07/14/2015   NA 140 07/14/2015   K 4.2 07/14/2015   CL 99 07/14/2015   CREATININE 2.65* 07/14/2015   BUN 48* 07/14/2015   CO2 34* 07/14/2015   TSH 5.415* 05/18/2015   INR 2.9 10/24/2015   HGBA1C 7.2* 09/26/2014   MICROALBUR 77.0* 03/28/2011    Lab Results  Component Value Date   TSH 5.415* 05/18/2015   Lab Results  Component Value Date   WBC 6.1 07/14/2015   HGB 10.7* 07/14/2015   HCT 32.8* 07/14/2015   MCV  90.3 07/14/2015   PLT 154.0 07/14/2015   Lab Results  Component Value Date   NA 140 07/14/2015   K 4.2 07/14/2015   CO2 34* 07/14/2015   GLUCOSE 105* 07/14/2015   BUN 48* 07/14/2015   CREATININE 2.65* 07/14/2015   BILITOT 0.5 07/14/2015   ALKPHOS 38* 07/14/2015   AST 20 07/14/2015   ALT 14  07/14/2015   PROT 6.9 07/14/2015   ALBUMIN 3.9 07/14/2015   CALCIUM 9.2 07/14/2015   ANIONGAP 9 05/18/2015   GFR 18.77* 07/14/2015   Lab Results  Component Value Date   CHOL 148 07/14/2015   Lab Results  Component Value Date   HDL 40.30 07/14/2015   Lab Results  Component Value Date   LDLCALC 68 07/14/2015   Lab Results  Component Value Date   TRIG 195.0* 07/14/2015   Lab Results  Component Value Date   CHOLHDL 4 07/14/2015   Lab Results  Component Value Date   HGBA1C 7.2* 09/26/2014       Assessment & Plan:   Problem List Items Addressed This Visit    Morbid obesity ([place]) - Primary (Chronic)    Encouraged DASH diet, decrease po intake and increase exercise as tolerated. Needs 7-8 hours of sleep nightly. Avoid trans fats, eat small, frequent meals every 4-5 hours with lean proteins, complex carbs and healthy fats. Minimize simple carbs      Hypothyroidism    On Levothyroxine, continue to monitor      Hyperlipidemia    Tolerating statin, encouraged heart healthy diet, avoid trans fats, minimize simple carbs and saturated fats. Increase exercise as tolerated      Essential hypertension    Well controlled, no changes to meds. Encouraged heart healthy diet such as the DASH diet and exercise as tolerated.       Cerumen impaction    H2O2 to ears qhs as needed         I am having Ms. Edgett start on predniSONE and meclizine. I am also having her maintain her albuterol, insulin NPH Human, insulin regular, glucose blood, NON FORMULARY, Vitamin D (Ergocalciferol), fluticasone, acetaminophen, fenofibrate, amiodarone, atorvastatin, warfarin, levothyroxine, escitalopram, gabapentin, carvedilol, LORazepam, montelukast, and torsemide.  Meds ordered this encounter  Medications  . predniSONE (DELTASONE) 10 MG tablet    Sig: 4 tabs po x 2 days then 3 tabs po x 2 days then 1 tab po qd x 2 days then 1/2 tab po x 2 days.    Dispense:  20 tablet    Refill:  0  . meclizine  (ANTIVERT) 25 MG tablet    Sig: Take 1 tablet (25 mg total) by mouth 3 (three) times daily as needed for dizziness.    Dispense:  30 tablet    Refill:  1     Penni Homans, MD

## 2015-10-28 NOTE — Assessment & Plan Note (Signed)
Tolerating statin, encouraged heart healthy diet, avoid trans fats, minimize simple carbs and saturated fats. Increase exercise as tolerated 

## 2015-10-28 NOTE — Assessment & Plan Note (Signed)
On Levothyroxine, continue to monitor 

## 2015-10-28 NOTE — Assessment & Plan Note (Signed)
Encouraged DASH diet, decrease po intake and increase exercise as tolerated. Needs 7-8 hours of sleep nightly. Avoid trans fats, eat small, frequent meals every 4-5 hours with lean proteins, complex carbs and healthy fats. Minimize simple carbs 

## 2015-10-28 NOTE — Assessment & Plan Note (Signed)
H2O2 to ears qhs as needed

## 2015-10-28 NOTE — Assessment & Plan Note (Signed)
Well controlled, no changes to meds. Encouraged heart healthy diet such as the DASH diet and exercise as tolerated.  °

## 2015-10-31 ENCOUNTER — Other Ambulatory Visit: Payer: Self-pay | Admitting: Family Medicine

## 2015-11-04 ENCOUNTER — Ambulatory Visit (INDEPENDENT_AMBULATORY_CARE_PROVIDER_SITE_OTHER): Payer: Medicare Other

## 2015-11-04 DIAGNOSIS — Z95810 Presence of automatic (implantable) cardiac defibrillator: Secondary | ICD-10-CM | POA: Diagnosis not present

## 2015-11-04 DIAGNOSIS — I5042 Chronic combined systolic (congestive) and diastolic (congestive) heart failure: Secondary | ICD-10-CM

## 2015-11-04 NOTE — Progress Notes (Signed)
EPIC Encounter for ICM Monitoring  Patient Name: Destiny [name] is a 75 y.o. female Date: 11/04/2015 Primary Care Physican: Penni Homans, MD Primary Cardiologist: [place] Electrophysiologist: Druscilla Brownie Weight: 259 lbs  Bi-Vacing >99%      In the past month, have you:  1. Gained more than 2 pounds in a day or more than 5 pounds in a week? No  2. Had changes in your medications (with verification of current medications)? No  3. Had more shortness of breath than is usual for you? No   4. Limited your activity because of shortness of breath? No   5. Not been able to sleep because of shortness of breath? No   6. Had increased swelling in your feet, ankles, legs or stomach area? No   7. Had symptoms of dehydration (dizziness, dry mouth, increased thirst, decreased urine output) No   8. Had changes in sodium restriction? No   9. Been compliant with medication? Yes   ICM trend: 3 month view for 11/04/2015   ICM trend: 1 year view for 6/23/217   Follow-up plan: ICM clinic phone appointment 12/08/2015.    FLUID LEVELS: [place] thoracic impedance decreased 10/04/2015 to 10/19/2015 suggesting fluid accumulation above reference line 10/20/2015 to 11/02/2015 suggesting fluid accumulation suggesting dryness.    SYMPTOMS:  Weight gain of 3 lbs within a few days.  Weight gain has resolved after taking 1 extra dose of Torsemide last week which correlates with thoracic impedance above reference line.  She reported having some ear problems with loss of hearing.  Has ENT appointment next week.  RECOMMENDATIONS:  Encouraged to drink up to 64 oz daily.  She stated she thought she was only supposed to drink 40 oz a day.  Reviewed last HF clinic note on 05/18/2015 and explained instructions say she can have up to 2 liters a day.  She stated she never received a recall for CHF clinic appointment and encouraged her to call for an appointment.        [name], RN, CCM 11/04/2015 2:11 PM

## 2015-11-08 DIAGNOSIS — H903 Sensorineural hearing loss, bilateral: Secondary | ICD-10-CM | POA: Diagnosis not present

## 2015-11-08 DIAGNOSIS — H9311 Tinnitus, right ear: Secondary | ICD-10-CM | POA: Diagnosis not present

## 2015-11-08 DIAGNOSIS — H9123 Sudden idiopathic hearing loss, bilateral: Secondary | ICD-10-CM | POA: Diagnosis not present

## 2015-11-10 ENCOUNTER — Telehealth: Payer: Self-pay | Admitting: Pharmacist

## 2015-11-10 NOTE — Telephone Encounter (Signed)
Pt started prednisone taper with 60mg  yesterday 6/28. She has a 12 day course. Advised pt to cut back Coumadin dose to 1/2 tab daily (affects Thursday and Sunday dose). She has INR recheck on Monday, 7/3. She will keep this appt.

## 2015-11-14 ENCOUNTER — Ambulatory Visit (INDEPENDENT_AMBULATORY_CARE_PROVIDER_SITE_OTHER): Payer: Medicare Other | Admitting: *Deleted

## 2015-11-14 DIAGNOSIS — Z5181 Encounter for therapeutic drug level monitoring: Secondary | ICD-10-CM | POA: Diagnosis not present

## 2015-11-14 DIAGNOSIS — I480 Paroxysmal atrial fibrillation: Secondary | ICD-10-CM

## 2015-11-14 DIAGNOSIS — I4891 Unspecified atrial fibrillation: Secondary | ICD-10-CM

## 2015-11-14 DIAGNOSIS — I6350 Cerebral infarction due to unspecified occlusion or stenosis of unspecified cerebral artery: Secondary | ICD-10-CM | POA: Diagnosis not present

## 2015-11-14 DIAGNOSIS — I482 Chronic atrial fibrillation, unspecified: Secondary | ICD-10-CM

## 2015-11-14 LAB — POCT INR: INR: 1.4

## 2015-11-16 ENCOUNTER — Other Ambulatory Visit (HOSPITAL_COMMUNITY): Payer: Self-pay | Admitting: Internal Medicine

## 2015-11-24 ENCOUNTER — Ambulatory Visit (INDEPENDENT_AMBULATORY_CARE_PROVIDER_SITE_OTHER): Payer: Medicare Other | Admitting: *Deleted

## 2015-11-24 DIAGNOSIS — Z5181 Encounter for therapeutic drug level monitoring: Secondary | ICD-10-CM

## 2015-11-24 DIAGNOSIS — I6350 Cerebral infarction due to unspecified occlusion or stenosis of unspecified cerebral artery: Secondary | ICD-10-CM

## 2015-11-24 DIAGNOSIS — I4891 Unspecified atrial fibrillation: Secondary | ICD-10-CM

## 2015-11-24 DIAGNOSIS — I482 Chronic atrial fibrillation, unspecified: Secondary | ICD-10-CM

## 2015-11-24 DIAGNOSIS — I480 Paroxysmal atrial fibrillation: Secondary | ICD-10-CM

## 2015-11-24 LAB — POCT INR: INR: 2.1

## 2015-11-25 ENCOUNTER — Other Ambulatory Visit: Payer: Self-pay | Admitting: Internal Medicine

## 2015-11-29 ENCOUNTER — Other Ambulatory Visit: Payer: Self-pay | Admitting: Family Medicine

## 2015-12-05 ENCOUNTER — Emergency Department (HOSPITAL_COMMUNITY)
Admission: EM | Admit: 2015-12-05 | Discharge: 2015-12-05 | Disposition: A | Discharge status: Home / Self Care | Payer: Medicare Other | Attending: Emergency Medicine | Admitting: Emergency Medicine

## 2015-12-05 ENCOUNTER — Encounter (HOSPITAL_COMMUNITY): Payer: Self-pay | Admitting: Emergency Medicine

## 2015-12-05 ENCOUNTER — Emergency Department (HOSPITAL_COMMUNITY): Payer: Medicare Other

## 2015-12-05 ENCOUNTER — Ambulatory Visit: Payer: Medicare Other | Admitting: Pulmonary Disease

## 2015-12-05 DIAGNOSIS — E039 Hypothyroidism, unspecified: Secondary | ICD-10-CM | POA: Diagnosis not present

## 2015-12-05 DIAGNOSIS — I130 Hypertensive heart and chronic kidney disease with heart failure and stage 1 through stage 4 chronic kidney disease, or unspecified chronic kidney disease: Secondary | ICD-10-CM | POA: Insufficient documentation

## 2015-12-05 DIAGNOSIS — N183 Chronic kidney disease, stage 3 (moderate): Secondary | ICD-10-CM | POA: Insufficient documentation

## 2015-12-05 DIAGNOSIS — Z794 Long term (current) use of insulin: Secondary | ICD-10-CM | POA: Diagnosis not present

## 2015-12-05 DIAGNOSIS — E1122 Type 2 diabetes mellitus with diabetic chronic kidney disease: Secondary | ICD-10-CM | POA: Diagnosis not present

## 2015-12-05 DIAGNOSIS — Z7901 Long term (current) use of anticoagulants: Secondary | ICD-10-CM | POA: Diagnosis not present

## 2015-12-05 DIAGNOSIS — I2510 Atherosclerotic heart disease of native coronary artery without angina pectoris: Secondary | ICD-10-CM | POA: Diagnosis not present

## 2015-12-05 DIAGNOSIS — Z951 Presence of aortocoronary bypass graft: Secondary | ICD-10-CM | POA: Insufficient documentation

## 2015-12-05 DIAGNOSIS — I110 Hypertensive heart disease with heart failure: Secondary | ICD-10-CM | POA: Diagnosis not present

## 2015-12-05 DIAGNOSIS — Z79899 Other long term (current) drug therapy: Secondary | ICD-10-CM | POA: Diagnosis not present

## 2015-12-05 DIAGNOSIS — Z8673 Personal history of transient ischemic attack (TIA), and cerebral infarction without residual deficits: Secondary | ICD-10-CM | POA: Diagnosis not present

## 2015-12-05 DIAGNOSIS — R0902 Hypoxemia: Secondary | ICD-10-CM | POA: Diagnosis not present

## 2015-12-05 DIAGNOSIS — I509 Heart failure, unspecified: Secondary | ICD-10-CM | POA: Insufficient documentation

## 2015-12-05 LAB — BASIC METABOLIC PANEL
ANION GAP: 7 (ref 5–15)
BUN: 47 mg/dL — ABNORMAL HIGH (ref 6–20)
CALCIUM: 8.9 mg/dL (ref 8.9–10.3)
CO2: 34 mmol/L — ABNORMAL HIGH (ref 22–32)
CREATININE: 2.95 mg/dL — AB (ref 0.44–1.00)
Chloride: 97 mmol/L — ABNORMAL LOW (ref 101–111)
GFR calc non Af Amer: 15 mL/min — ABNORMAL LOW (ref >60)
GFR, EST AFRICAN AMERICAN: 17 mL/min — AB (ref >60)
Glucose, Bld: 81 mg/dL (ref 65–99)
Potassium: 4 mmol/L (ref 3.5–5.1)
SODIUM: 138 mmol/L (ref 135–145)

## 2015-12-05 LAB — I-STAT TROPONIN, ED: TROPONIN I, POC: 0.03 ng/mL (ref 0.00–0.08)

## 2015-12-05 LAB — CBC
HCT: 32.9 % — ABNORMAL LOW (ref 36.0–46.0)
Hemoglobin: 10.2 g/dL — ABNORMAL LOW (ref 12.0–15.0)
MCH: 30.7 pg (ref 26.0–34.0)
MCHC: 31 g/dL (ref 30.0–36.0)
MCV: 99.1 fL (ref 78.0–100.0)
PLATELETS: 171 10*3/uL (ref 150–400)
RBC: 3.32 MIL/uL — AB (ref 3.87–5.11)
RDW: 17.3 % — ABNORMAL HIGH (ref 11.5–15.5)
WBC: 6.1 10*3/uL (ref 4.0–10.5)

## 2015-12-05 LAB — PROTIME-INR
INR: 2.17 — AB (ref 0.00–1.49)
PROTHROMBIN TIME: 24 s — AB (ref 11.6–15.2)

## 2015-12-05 LAB — BRAIN NATRIURETIC PEPTIDE: B Natriuretic Peptide: 655.1 pg/mL — ABNORMAL HIGH (ref 0.0–100.0)

## 2015-12-05 MED ORDER — FUROSEMIDE 10 MG/ML IJ SOLN
60.0000 mg | Freq: Once | INTRAMUSCULAR | Status: AC
  Administered 2015-12-05: 60 mg via INTRAVENOUS
  Filled 2015-12-05: qty 6

## 2015-12-05 NOTE — ED Notes (Signed)
Destiny [name] - Transporter, returned pt to room.

## 2015-12-05 NOTE — ED Provider Notes (Signed)
Patient care seemed at shift change from [facility], [place]. For full history of present illness, please see initial provider's notes.  In short, 75 year old female with past medical history of congestive heart failure presenting with weight gain and hypoxia. Patient reports a 2 pound weight gain and oxygen levels in the mid 80s at home. Patient is asymptomatic and denies fevers, chills, chest pain, shortness of breath, cough or leg swelling. Workup by initial provider does not show florid heart failure exacerbation. Patient has been given IV Lasix. Anticipate discharge after monitoring the patient for urination and no change in symptoms.  6:30: Reevaluated patient who reports she has been urinating. She continues to deny chest pain or shortness of breath. She states she feels well and is ready for discharge home. She has maintained her oxygenation above 92% while being monitored in emergency department. The patient importance of close follow-up with her heart failure clinic. Patient is to call them first thing in the morning to schedule an appointment. I have also discussed reasons to return immediately to the emergency department. Patient states understanding and is stable for outpatient follow-up.  Vitals:   12/05/15 1530 12/05/15 1600 12/05/15 1630 12/05/15 1835  BP: 108/69 (!) 116/54 96/68 (!) 102/45  Pulse: 73 76 70 73  Resp: 17 17 18 18   Temp:      TempSrc:      SpO2: 95% (!) 89% 91% 94%  Weight:      Height:       Results for orders placed or performed during the hospital encounter of 12/05/15 (from the past 24 hour(s))  Basic metabolic panel     Status: Abnormal   Collection Time: 12/05/15 11:44 AM  Result Value Ref Range   Sodium 138 135 - 145 mmol/L   Potassium 4.0 3.5 - 5.1 mmol/L   Chloride 97 (L) 101 - 111 mmol/L   CO2 34 (H) 22 - 32 mmol/L   Glucose, Bld 81 65 - 99 mg/dL   BUN 47 (H) 6 - 20 mg/dL   Creatinine, Ser 2.95 (H) 0.44 - 1.00 mg/dL   Calcium 8.9 8.9 - 10.3 mg/dL   GFR calc non Af Amer 15 (L) >60 mL/min   GFR calc Af Amer 17 (L) >60 mL/min   Anion gap 7 5 - 15  CBC     Status: Abnormal   Collection Time: 12/05/15 11:44 AM  Result Value Ref Range   WBC 6.1 4.0 - 10.5 K/uL   RBC 3.32 (L) 3.87 - 5.11 MIL/uL   Hemoglobin 10.2 (L) 12.0 - 15.0 g/dL   HCT 32.9 (L) 36.0 - 46.0 %   MCV 99.1 78.0 - 100.0 fL   MCH 30.7 26.0 - 34.0 pg   MCHC 31.0 30.0 - 36.0 g/dL   RDW 17.3 (H) 11.5 - 15.5 %   Platelets 171 150 - 400 K/uL  Protime-INR (order if Patient is taking Coumadin / Warfarin)     Status: Abnormal   Collection Time: 12/05/15 11:44 AM  Result Value Ref Range   Prothrombin Time 24.0 (H) 11.6 - 15.2 seconds   INR 2.17 (H) 0.00 - 1.49  Brain natriuretic peptide     Status: Abnormal   Collection Time: 12/05/15 11:44 AM  Result Value Ref Range   B Natriuretic Peptide 655.1 (H) 0.0 - 100.0 pg/mL  I-stat troponin, ED     Status: None   Collection Time: 12/05/15 12:01 PM  Result Value Ref Range   Troponin i, poc 0.03 0.00 -  0.08 ng/mL   Comment 3           *Note: Due to a large number of results and/or encounters for the requested time period, some results have not been displayed. A complete set of results can be found in Results Review.      Lahoma Crocker Trey Bebee, PA-C 12/05/15 1841    Malvin Johns, MD 12/06/15 [phone]

## 2015-12-05 NOTE — ED Notes (Signed)
Pt departed in NAD, escorted in wheelchair by NT.  

## 2015-12-05 NOTE — ED Triage Notes (Signed)
Pt has extensive cardiac hx, c/o weight gain of 2# in 1 day, has taken extra fluid pill yesterday, has been short of breath-- sees Dr. Haroldine Laws for cardiology, Dr. Lovena Le for pacemaker.  Pt on home O2 continuously.   Pt states has not been able to get O2 Sat up to 90% at home.

## 2015-12-05 NOTE — ED Notes (Signed)
Rudy Suits; Transporter, transporting pt to x-ray.  

## 2015-12-05 NOTE — ED Provider Notes (Signed)
[facility] DEPT Provider Note   CSN: [id number] Arrival date & time: 12/05/15  1106  First Provider Contact:  None       History   Chief Complaint Chief Complaint  Patient presents with  . Shortness of Breath  . Congestive Heart Failure    HPI Destiny [name] is a 75 y.o. female.  HPI   Pt with hx CHF, CAD s/p CABG, DM, pacemaker p/w weight gain and hypoxia.  States she noted a 2 lb weight gain yesterday, took an extra torsemide per protocol.  This morning the weight was unchanged and her O2 was in the mid 80s at rest with her home O2 (2L, [place]).  Pt uses 3L with any moving around.  Denies any other medication changes, denies any food changes or increase in salt.  Feels the fluid retention is in her abdomen.  Denies fever, CP, SOB, cough, leg swelling or pain.   Baseline weight 260-263  Past Medical History:  Diagnosis Date  . AICD (automatic cardioverter/defibrillator) present   . Anemia, unspecified 12/13/2013  . Back pain, chronic    "just when I walk; mass on 3rd and 4th vertebrae right lower back"  . Biventricular implantable cardiac defibrillator in situ 2007, 2012   a. 2007;  b. 2012 Gen change: SJM 3231-40 Uni BiV ICD, ser # [id number].  Marland Kitchen CAD (coronary artery disease)    a. 05/2001 CABG x4: LIMA->LAD, VG->D1, VG->D2, VG->RCA.  . Cardiomyopathy, ischemic    a. 2012 s/p SJM 3231-40 Uni BiV ICD, ser # [id number].  . Carotid stenosis    a. 10/19/2011 carotid duplex - Mild hard plaque bilaterally. Stable 40-59% bilateral ICA stenosis. Carotid US (04/2013):  Bilateral 40-59% ICA.  F/u 1 year  . Cerumen impaction 10/28/2015  . Chronic combined systolic and diastolic CHF (congestive heart failure) (HCC)    a. EF as low as 25% in 2006;  b. EF 60-65% in 12/2011;  c. 02/2013 Echo: EF 35-40, mod mid-dist antsept HK, Gr 2 DD, mild LVH.  . CKD (chronic kidney disease), stage III   . 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  Dyslipidemia   . Gout ~ 08/2011  . Hypercalcemia 09/26/2013  . Hypertension   . Hypothyroidism   . Hypoxemia requiring supplemental oxygen   . LBBB (left bundle branch block)   . Morbid obesity with BMI of 45.0-49.9, adult (HCC)    Ht. 5'4". BMI 47.2  . Mural thrombus of left ventricle (HCC)    before 2003, while on Coumadin, No h/o CVA  . 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 Benay Spice, in remission  . OSA on CPAP   . PAF (paroxysmal atrial fibrillation) (HCC)    on Coumadin  . Presence of permanent cardiac pacemaker   . 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 Vitamin D deficiency 06/09/2012   historical    Patient Active Problem List   Diagnosis Date Noted  . Cerumen impaction 10/28/2015  . CHF exacerbation ([place]) 03/12/2015  . Hypoglycemia 10/25/2014  . Medicare annual wellness visit, subsequent 09/12/2014  . Coagulopathy ([place]) 07/29/2014  . Morbid obesity ([place]) 07/29/2014  . Type 2 diabetes mellitus, controlled, with renal complications ([place]) 123456  . Chronic combined systolic and diastolic CHF (congestive heart failure) ([place]) 07/28/2014  . Anemia 12/13/2013  . DOE (dyspnea on exertion) 11/30/2013  . Edema 09/26/2013  .  Hypercalcemia 09/26/2013  . Encounter for therapeutic drug monitoring 06/10/2013  . Chronic combined systolic and diastolic heart failure ([place]) 05/01/2013  . Physical deconditioning 03/25/2013  . Hypothyroidism 03/06/2013  . Olecranon bursitis of right elbow 12/29/2012  . Vitamin D deficiency 06/09/2012  . History of Cardiomyopathy, ischemic   . OSA (obstructive sleep apnea) 09/05/2010  . LEG PAIN, BILATERAL 12/28/2009  . Diabetes mellitus type 2 with retinopathy ([place]) 10/28/2009  . ATRIAL FIBRILLATION, PAROXYSMAL 06/21/2009  . NHL (non-Hodgkin's lymphoma) ([place]) 04/21/2009  . MURAL THROMBUS, LEFT VENTRICLE 03/07/2009  . LYMPHEDEMA 03/07/2009  . LBBB 08/26/2008  . CVA  08/26/2008  . BACK PAIN, CHRONIC 08/26/2008  . Automatic implantable cardioverter-defibrillator in situ 08/26/2008  . UTI'S, RECURRENT 06/24/2008  . CAROTID BRUIT 05/20/2007  . Hyperlipidemia 03/19/2007  . Essential hypertension 11/20/2006    Past Surgical History:  Procedure Laterality Date  . ABDOMINAL HYSTERECTOMY  1982  . CARDIAC CATHETERIZATION  06/03/01  . CARDIOVERSION N/A 07/30/2014   Procedure: CARDIOVERSION;  Surgeon: Jolaine Artist, MD;  Location: [facility] ENDOSCOPY;  Service: Cardiovascular;  Laterality: N/A;  . CARDIOVERSION N/A 11/25/2014   Procedure: CARDIOVERSION;  Surgeon: Evans Lance, MD;  Location: [facility];  Service: Cardiovascular;  Laterality: N/A;  . CATARACT EXTRACTION     left eye  . CHOLECYSTECTOMY  1970  . CORONARY ARTERY BYPASS GRAFT  2003   CABG X4  . INSERT / REPLACE / REMOVE PACEMAKER  2007; 2012   w/AICD  . REFRACTIVE SURGERY     right eye  . TUBAL LIGATION  1972    OB History    No data available       Home Medications    Prior to Admission medications   Medication Sig Start Date End Date Taking? Authorizing Provider  acetaminophen (TYLENOL) 500 MG tablet Take 1,000 mg by mouth every 6 (six) hours as needed (pain).    Historical Provider, MD  albuterol (PROVENTIL HFA;VENTOLIN HFA) 108 (90 BASE) MCG/ACT inhaler Inhale 2 puffs into the lungs every 6 (six) hours as needed for wheezing or shortness of breath. 12/26/12   Mosie Lukes, MD  amiodarone (PACERONE) 200 MG tablet TAKE 1 TABLET TWICE DAILY 11/25/15   Evans Lance, MD  atorvastatin (LIPITOR) 20 MG tablet TAKE 1 TABLET EVERY DAY 10/31/15   Mosie Lukes, MD  carvedilol (COREG) 25 MG tablet TAKE 2 TABLETS TWICE DAILY 08/30/15   Jolaine Artist, MD  escitalopram (LEXAPRO) 10 MG tablet TAKE 1 TABLET EVERY DAY 11/29/15   Mosie Lukes, MD  fenofibrate (TRICOR) 48 MG tablet TAKE 1 TABLET EVERY DAY 11/16/15   Jolaine Artist, MD  fluticasone (FLONASE) 50 MCG/ACT nasal spray Place 2  sprays into both nostrils daily as needed for allergies or rhinitis. As needed for nasal stuffiness. 01/13/14   Mosie Lukes, MD  gabapentin (NEURONTIN) 300 MG capsule TAKE 1 CAPSULE TWICE DAILY 08/03/15   Mosie Lukes, MD  glucose blood (FREESTYLE LITE) test strip DX: 250.60  Check sugars bid and as needed 05/22/13   Mosie Lukes, MD  insulin NPH (HUMULIN N,NOVOLIN N) 100 UNIT/ML injection Inject 20-45 Units into the skin 2 (two) times daily before a meal. Take 30 units in the morning and 10 units in the evening    Historical Provider, MD  insulin regular (NOVOLIN R,HUMULIN R) 100 units/mL injection Change to 15 units three times a with meals (to cover all meals--this is the same total amount but spread out) Patient  taking differently: Inject 15 Units into the skin 2 (two) times daily before a meal. Change to 15 units three times a with meals (to cover all meals--this is the same total amount but spread out) 04/01/13   Ivan Anchors Love, PA-C  levothyroxine (SYNTHROID, LEVOTHROID) 25 MCG tablet TAKE 1 TABLET EVERY DAY BEFORE BREAKFAST 11/29/15   Mosie Lukes, MD  LORazepam (ATIVAN) 0.5 MG tablet Take 1 tablet (0.5 mg total) by mouth at bedtime. 10/03/15   Mosie Lukes, MD  meclizine (ANTIVERT) 25 MG tablet Take 1 tablet (25 mg total) by mouth 3 (three) times daily as needed for dizziness. 10/18/15   Mosie Lukes, MD  montelukast (SINGULAIR) 10 MG tablet Take 1 tablet (10 mg total) by mouth at bedtime. 10/03/15   Darreld Mclean, MD  NON FORMULARY Oxygen---24 hour    Historical Provider, MD  predniSONE (DELTASONE) 10 MG tablet 4 tabs po x 2 days then 3 tabs po x 2 days then 1 tab po qd x 2 days then 1/2 tab po x 2 days. 10/18/15   Mosie Lukes, MD  torsemide (DEMADEX) 20 MG tablet TAKE 2 TABLETS EVERY MORNING, TAKE 1 TABLET IN THE AFTERNOON AND MAY TAKE AN EXTRA TABLET WITH A 3 POUND WEIGHT GAIN. 10/13/15   Mosie Lukes, MD  Vitamin D, Ergocalciferol, (DRISDOL) 50000 UNITS CAPS capsule Take 50,000  Units by mouth every 7 (seven) days. Tuesdays    Historical Provider, MD  warfarin (COUMADIN) 5 MG tablet TAKE AS DIRECTED BY ANTICOAGULATION CLINIC. 06/30/15   Evans Lance, MD    Family History Family History  Problem Relation Age of Onset  . Kidney cancer Mother     kidney and female repo - died @ 26  . Asthma Mother   . Cancer Mother     gyn and renal  . Heart disease Mother     CAD  . Heart disease Father     died @ 31  . Stroke Father   . Diabetes Father   . Hypertension Father   . Hyperlipidemia Father   . Heart attack Father   . Hypertension Son   . Kidney cancer Maternal Uncle   . Cancer Maternal Uncle   . Cirrhosis Maternal Grandmother     non alcohol  . Cancer Maternal Grandfather   . Kidney cancer Maternal Grandfather   . Hypertension Maternal Aunt     Social History Social History  Substance Use Topics  . Smoking status: Never Smoker  . Smokeless tobacco: Never Used  . Alcohol use No     Allergies   Sulfonamide derivatives   Review of Systems Review of Systems  All other systems reviewed and are negative.    Physical Exam Updated Vital Signs BP 119/74 (BP Location: Left Arm)   Pulse 70   Temp 98 F (36.7 C) (Oral)   Resp 17   Ht 5\' 4"  (1.626 m)   Wt 120.8 kg   SpO2 96%   BMI 45.73 kg/m   Physical Exam  Constitutional: She appears well-developed and well-nourished. No distress.  HENT:  Head: Normocephalic and atraumatic.  Neck: Neck supple.  Cardiovascular: Normal rate and regular rhythm.   Pulmonary/Chest: No respiratory distress. She has no wheezes. She has rales.  2L [place].  Labored breathing.   Abdominal: Soft. She exhibits no distension. There is no tenderness. There is no rebound and no guarding.  Morbidly obese  Musculoskeletal: She exhibits no edema.  Neurological: She is  alert.  Skin: She is not diaphoretic.  Nursing note and vitals reviewed.    ED Treatments / Results  Labs (all labs ordered are listed, but only  abnormal results are displayed) Labs Reviewed  BASIC METABOLIC PANEL - Abnormal; Notable for the following:       Result Value   Chloride 97 (*)    CO2 34 (*)    BUN 47 (*)    Creatinine, Ser 2.95 (*)    GFR calc non Af Amer 15 (*)    GFR calc Af Amer 17 (*)    All other components within normal limits  CBC - Abnormal; Notable for the following:    RBC 3.32 (*)    Hemoglobin 10.2 (*)    HCT 32.9 (*)    RDW 17.3 (*)    All other components within normal limits  PROTIME-INR - Abnormal; Notable for the following:    Prothrombin Time 24.0 (*)    INR 2.17 (*)    All other components within normal limits  BRAIN NATRIURETIC PEPTIDE - Abnormal; Notable for the following:    B Natriuretic Peptide 655.1 (*)    All other components within normal limits  I-STAT TROPOININ, ED    EKG  EKG Interpretation None       Radiology Dg Chest 2 View  Result Date: 12/05/2015 CLINICAL DATA:  Decreased oxygen saturation. EXAM: CHEST  2 VIEW COMPARISON:  03/11/2015 FINDINGS: Stable dual lead cardiac pacemaker. Cardiomediastinal silhouette is stably enlarged. Mediastinal contours appear intact. Bilateral main pulmonary arteries are prominent. There is no evidence of pneumothorax. There is coarsening of the interstitial markings usually seen with interstitial pulmonary edema. Osseous structures are without acute abnormality. Soft tissues are grossly normal. IMPRESSION: Findings suggestive of congestive heart failure with stable cardiomegaly and interval development of interstitial pulmonary edema. Probable pulmonary arterial hypertension. Electronically Signed   By: Fidela Salisbury M.D.   On: 12/05/2015 13:02   Procedures Procedures (including critical care time)  Medications Ordered in ED Medications  furosemide (LASIX) injection 60 mg (60 mg Intravenous Given 12/05/15 1516)     Initial Impression / Assessment and Plan / ED Course  I have reviewed the triage vital signs and the nursing  notes.  Pertinent labs & imaging results that were available during my care of the patient were reviewed by me and considered in my medical decision making (see chart for details).  Clinical Course  Value Comment By Time  MAP (mmHg): 71 (Reviewed) Clayton Bibles, PA-C 07/24 1309   Afebrile nontoxic morbidly obese patient with CHF p/w weight gain and hypoxia at home.  Denies SOB.  O2 is normal on her home oxygen in ED (mid 90s).  Weight is 267 in ED.  Pt continues to feel well in ED, is starting to urinate after IV lasix given.  Patient signed out to [facility], PA-C, pending trial of IV diuretic.  Anticipate discharge home if able to urinate and feel improvement.  Will need to follow up with heart failure clinic.  Cardiologist Dr Haroldine Laws.   Final Clinical Impressions(s) / ED Diagnoses   Final diagnoses:  Congestive heart failure, unspecified congestive heart failure chronicity, unspecified congestive heart failure type [facility])    New Prescriptions New Prescriptions   No medications on file     [place], PA-C 12/05/15 [place], MD 12/06/15 [phone]

## 2015-12-05 NOTE — Discharge Instructions (Signed)
Call your heart failure doctor first thing in the morning to schedule a follow up appointment. Continue monitoring your oxygen and weights at home. Continue all your medications as prescribed. Return to ED with new, worsening or concernign symptoms.

## 2015-12-05 NOTE — ED Notes (Signed)
Pt used bedside commode. After getting back into bed O2 sats dropped to 82% on 2L.  O2 sats rebounded to 97% on 2L within 5 minutes.

## 2015-12-08 ENCOUNTER — Ambulatory Visit (INDEPENDENT_AMBULATORY_CARE_PROVIDER_SITE_OTHER): Payer: Medicare Other

## 2015-12-08 DIAGNOSIS — Z95810 Presence of automatic (implantable) cardiac defibrillator: Secondary | ICD-10-CM

## 2015-12-08 DIAGNOSIS — I5042 Chronic combined systolic (congestive) and diastolic (congestive) heart failure: Secondary | ICD-10-CM

## 2015-12-09 NOTE — Progress Notes (Signed)
EPIC Encounter for ICM Monitoring  Patient Name: Destiny [name] is a 75 y.o. female Date: 12/09/2015 Primary Care Physican: Penni Homans, MD Primary Cardiologist: [place] Electrophysiologist: Lovena Le Nephrologist: Dr Marval Regal Dry Weight: 268 lb  Bi-V Pacing:  >99%       Heart Failure questions reviewed, pt symptomatic with some stomach bloating and weight gain of 8 lbs in the last couple of weeks and 2 of those lbs since 12/05/2015. She has taken 1 extra tablet of Torsemide 20 mg yesterday and today.  She does not feel like she is urinating enough.   Thoracic impedance abnormal suggesting fluid accumulation since 11/19/2015 and presented at the ED on 12/05/2015 for SOB and weight gain.  LABS: 12/05/2015 BNP 655.1 12/05/2015 Creatinine 2.95, BUN 47, Potassium 4.0, Sodium 138, GFR 15 07/14/2015 Creatinine 2.65, BUN 48, Potassium 4.2, Sodium 140 05/18/2015 Creatinine 3.00, BUN 53, Potassium 4.3, Sodium 140   Recommendations:  Reviewed with Dr Lovena Le in the office.  He ordered her to increase Torsemide 20 mg to 3 tablets every am and 2 tablets every pm until weight loss of 8 lbs or until appt with Dr Haroldine Laws on 12/21/2015.  She should go to ER if she has additional symptoms or weight gain and stomach bloating worsen.  Call to patient and advised of Dr Tanna Furry recommendation to  increase Torsemide 20 mg to 3 tablets every am and 2 tablets every pm until weight loss of 8 lbs or until appt with Dr Haroldine Laws on 12/21/2015.  She should go to ER if she has additional symptoms or weight gain and stomach bloating worsen. She verbalized understanding.   ICM trend: 12/09/2015     Follow-up plan: ICM clinic phone appointment on 12/14/2015.  Office appointment with Dr Haroldine Laws on 12/21/2015 and Dr Lovena Le on 12/28/2015.    Copy of ICM check sent to primary cardiologist and device physician.   [name], RN 12/09/2015 11:19 AM

## 2015-12-14 ENCOUNTER — Observation Stay (HOSPITAL_COMMUNITY)
Admission: EM | Admit: 2015-12-14 | Discharge: 2015-12-17 | Disposition: A | Discharge status: Home / Self Care | Payer: Medicare Other | Attending: Internal Medicine | Admitting: Internal Medicine

## 2015-12-14 ENCOUNTER — Ambulatory Visit (INDEPENDENT_AMBULATORY_CARE_PROVIDER_SITE_OTHER): Payer: Medicare Other

## 2015-12-14 ENCOUNTER — Observation Stay (HOSPITAL_COMMUNITY): Payer: Medicare Other

## 2015-12-14 ENCOUNTER — Emergency Department (HOSPITAL_COMMUNITY): Payer: Medicare Other

## 2015-12-14 ENCOUNTER — Encounter (HOSPITAL_COMMUNITY): Payer: Self-pay | Admitting: *Deleted

## 2015-12-14 DIAGNOSIS — I130 Hypertensive heart and chronic kidney disease with heart failure and stage 1 through stage 4 chronic kidney disease, or unspecified chronic kidney disease: Secondary | ICD-10-CM | POA: Insufficient documentation

## 2015-12-14 DIAGNOSIS — Z9981 Dependence on supplemental oxygen: Secondary | ICD-10-CM | POA: Insufficient documentation

## 2015-12-14 DIAGNOSIS — G8929 Other chronic pain: Secondary | ICD-10-CM | POA: Diagnosis not present

## 2015-12-14 DIAGNOSIS — S0990XA Unspecified injury of head, initial encounter: Secondary | ICD-10-CM | POA: Insufficient documentation

## 2015-12-14 DIAGNOSIS — E1122 Type 2 diabetes mellitus with diabetic chronic kidney disease: Secondary | ICD-10-CM | POA: Insufficient documentation

## 2015-12-14 DIAGNOSIS — I5042 Chronic combined systolic (congestive) and diastolic (congestive) heart failure: Secondary | ICD-10-CM | POA: Diagnosis not present

## 2015-12-14 DIAGNOSIS — E11319 Type 2 diabetes mellitus with unspecified diabetic retinopathy without macular edema: Secondary | ICD-10-CM | POA: Insufficient documentation

## 2015-12-14 DIAGNOSIS — Z7901 Long term (current) use of anticoagulants: Secondary | ICD-10-CM | POA: Diagnosis not present

## 2015-12-14 DIAGNOSIS — Z95810 Presence of automatic (implantable) cardiac defibrillator: Secondary | ICD-10-CM

## 2015-12-14 DIAGNOSIS — Z950 Presence of cardiac pacemaker: Secondary | ICD-10-CM | POA: Diagnosis not present

## 2015-12-14 DIAGNOSIS — R531 Weakness: Secondary | ICD-10-CM

## 2015-12-14 DIAGNOSIS — E039 Hypothyroidism, unspecified: Secondary | ICD-10-CM | POA: Insufficient documentation

## 2015-12-14 DIAGNOSIS — N179 Acute kidney failure, unspecified: Secondary | ICD-10-CM | POA: Diagnosis not present

## 2015-12-14 DIAGNOSIS — J811 Chronic pulmonary edema: Secondary | ICD-10-CM | POA: Diagnosis not present

## 2015-12-14 DIAGNOSIS — N183 Chronic kidney disease, stage 3 (moderate): Secondary | ICD-10-CM | POA: Insufficient documentation

## 2015-12-14 DIAGNOSIS — R410 Disorientation, unspecified: Secondary | ICD-10-CM | POA: Diagnosis not present

## 2015-12-14 DIAGNOSIS — G4733 Obstructive sleep apnea (adult) (pediatric): Secondary | ICD-10-CM

## 2015-12-14 DIAGNOSIS — E662 Morbid (severe) obesity with alveolar hypoventilation: Secondary | ICD-10-CM | POA: Diagnosis not present

## 2015-12-14 DIAGNOSIS — I480 Paroxysmal atrial fibrillation: Secondary | ICD-10-CM | POA: Diagnosis present

## 2015-12-14 DIAGNOSIS — I5043 Acute on chronic combined systolic (congestive) and diastolic (congestive) heart failure: Secondary | ICD-10-CM | POA: Diagnosis present

## 2015-12-14 DIAGNOSIS — D649 Anemia, unspecified: Secondary | ICD-10-CM | POA: Diagnosis not present

## 2015-12-14 DIAGNOSIS — I255 Ischemic cardiomyopathy: Secondary | ICD-10-CM | POA: Insufficient documentation

## 2015-12-14 DIAGNOSIS — Z951 Presence of aortocoronary bypass graft: Secondary | ICD-10-CM | POA: Diagnosis not present

## 2015-12-14 DIAGNOSIS — I482 Chronic atrial fibrillation: Secondary | ICD-10-CM | POA: Insufficient documentation

## 2015-12-14 DIAGNOSIS — Z794 Long term (current) use of insulin: Secondary | ICD-10-CM | POA: Insufficient documentation

## 2015-12-14 DIAGNOSIS — E11649 Type 2 diabetes mellitus with hypoglycemia without coma: Secondary | ICD-10-CM | POA: Insufficient documentation

## 2015-12-14 DIAGNOSIS — Z8249 Family history of ischemic heart disease and other diseases of the circulatory system: Secondary | ICD-10-CM | POA: Insufficient documentation

## 2015-12-14 DIAGNOSIS — G9340 Encephalopathy, unspecified: Secondary | ICD-10-CM

## 2015-12-14 DIAGNOSIS — Z6841 Body Mass Index (BMI) 40.0 and over, adult: Secondary | ICD-10-CM | POA: Insufficient documentation

## 2015-12-14 DIAGNOSIS — Z79899 Other long term (current) drug therapy: Secondary | ICD-10-CM | POA: Diagnosis not present

## 2015-12-14 DIAGNOSIS — R5381 Other malaise: Secondary | ICD-10-CM

## 2015-12-14 DIAGNOSIS — E1140 Type 2 diabetes mellitus with diabetic neuropathy, unspecified: Secondary | ICD-10-CM | POA: Diagnosis not present

## 2015-12-14 DIAGNOSIS — I10 Essential (primary) hypertension: Secondary | ICD-10-CM

## 2015-12-14 DIAGNOSIS — E785 Hyperlipidemia, unspecified: Secondary | ICD-10-CM | POA: Insufficient documentation

## 2015-12-14 DIAGNOSIS — I2510 Atherosclerotic heart disease of native coronary artery without angina pectoris: Secondary | ICD-10-CM | POA: Insufficient documentation

## 2015-12-14 DIAGNOSIS — Z8572 Personal history of non-Hodgkin lymphomas: Secondary | ICD-10-CM | POA: Insufficient documentation

## 2015-12-14 DIAGNOSIS — E875 Hyperkalemia: Secondary | ICD-10-CM

## 2015-12-14 DIAGNOSIS — R140 Abdominal distension (gaseous): Secondary | ICD-10-CM | POA: Diagnosis not present

## 2015-12-14 LAB — I-STAT VENOUS BLOOD GAS, ED
ACID-BASE EXCESS: 11 mmol/L — AB (ref 0.0–2.0)
BICARBONATE: 38.1 meq/L — AB (ref 20.0–24.0)
O2 SAT: 36 %
PH VEN: 7.367 — AB (ref 7.250–7.300)
TCO2: 40 mmol/L (ref 0–100)
pCO2, Ven: 66.3 mmHg — ABNORMAL HIGH (ref 45.0–50.0)
pO2, Ven: 23 mmHg — ABNORMAL LOW (ref 31.0–45.0)

## 2015-12-14 LAB — PROTIME-INR
INR: 2.51
Prothrombin Time: 27.5 seconds — ABNORMAL HIGH (ref 11.4–15.2)

## 2015-12-14 LAB — CBC
HCT: 34.8 % — ABNORMAL LOW (ref 36.0–46.0)
HEMOGLOBIN: 10.5 g/dL — AB (ref 12.0–15.0)
MCH: 30.4 pg (ref 26.0–34.0)
MCHC: 30.2 g/dL (ref 30.0–36.0)
MCV: 100.9 fL — ABNORMAL HIGH (ref 78.0–100.0)
PLATELETS: 222 10*3/uL (ref 150–400)
RBC: 3.45 MIL/uL — AB (ref 3.87–5.11)
RDW: 17.7 % — ABNORMAL HIGH (ref 11.5–15.5)
WBC: 8 10*3/uL (ref 4.0–10.5)

## 2015-12-14 LAB — BASIC METABOLIC PANEL
Anion gap: 8 (ref 5–15)
BUN: 57 mg/dL — ABNORMAL HIGH (ref 6–20)
CALCIUM: 9.2 mg/dL (ref 8.9–10.3)
CO2: 34 mmol/L — ABNORMAL HIGH (ref 22–32)
CREATININE: 3.38 mg/dL — AB (ref 0.44–1.00)
Chloride: 93 mmol/L — ABNORMAL LOW (ref 101–111)
GFR calc Af Amer: 14 mL/min — ABNORMAL LOW (ref >60)
GFR, EST NON AFRICAN AMERICAN: 13 mL/min — AB (ref >60)
Glucose, Bld: 113 mg/dL — ABNORMAL HIGH (ref 65–99)
Potassium: 5.5 mmol/L — ABNORMAL HIGH (ref 3.5–5.1)
Sodium: 135 mmol/L (ref 135–145)

## 2015-12-14 LAB — GLUCOSE, CAPILLARY: Glucose-Capillary: 164 mg/dL — ABNORMAL HIGH (ref 65–99)

## 2015-12-14 LAB — I-STAT TROPONIN, ED: TROPONIN I, POC: 0.03 ng/mL (ref 0.00–0.08)

## 2015-12-14 MED ORDER — ATORVASTATIN CALCIUM 20 MG PO TABS: 20.0000 mg | ORAL_TABLET | Freq: Every day | ORAL | Status: DC

## 2015-12-14 MED ORDER — FENOFIBRATE 54 MG PO TABS
54.0000 mg | ORAL_TABLET | Freq: Every day | ORAL | Status: DC
  Administered 2015-12-15 – 2015-12-17 (×3): 54 mg via ORAL
  Filled 2015-12-14 (×5): qty 1

## 2015-12-14 MED ORDER — ESCITALOPRAM OXALATE 10 MG PO TABS
10.0000 mg | ORAL_TABLET | Freq: Every day | ORAL | Status: DC
  Administered 2015-12-15 – 2015-12-17 (×3): 10 mg via ORAL
  Filled 2015-12-14 (×3): qty 1

## 2015-12-14 MED ORDER — ATORVASTATIN CALCIUM 20 MG PO TABS
20.0000 mg | ORAL_TABLET | Freq: Every day | ORAL | Status: DC
  Administered 2015-12-15 – 2015-12-16 (×3): 20 mg via ORAL
  Filled 2015-12-14 (×3): qty 1

## 2015-12-14 MED ORDER — ACETAMINOPHEN 325 MG PO TABS
650.0000 mg | ORAL_TABLET | Freq: Four times a day (QID) | ORAL | Status: DC | PRN
  Administered 2015-12-15: 650 mg via ORAL
  Filled 2015-12-14: qty 2

## 2015-12-14 MED ORDER — CARVEDILOL 25 MG PO TABS
50.0000 mg | ORAL_TABLET | Freq: Two times a day (BID) | ORAL | Status: DC
  Administered 2015-12-14 – 2015-12-17 (×6): 50 mg via ORAL
  Filled 2015-12-14 (×6): qty 2

## 2015-12-14 MED ORDER — SODIUM CHLORIDE 0.9 % IV SOLN
INTRAVENOUS | Status: AC
  Administered 2015-12-14: 22:00:00 via INTRAVENOUS

## 2015-12-14 MED ORDER — INSULIN ASPART 100 UNIT/ML ~~LOC~~ SOLN
0.0000 [IU] | SUBCUTANEOUS | Status: DC
  Administered 2015-12-14: 2 [IU] via SUBCUTANEOUS
  Administered 2015-12-15: 3 [IU] via SUBCUTANEOUS
  Administered 2015-12-15: 1 [IU] via SUBCUTANEOUS
  Administered 2015-12-15 (×3): 2 [IU] via SUBCUTANEOUS
  Administered 2015-12-16: 3 [IU] via SUBCUTANEOUS
  Administered 2015-12-16 (×2): 2 [IU] via SUBCUTANEOUS
  Administered 2015-12-16: 7 [IU] via SUBCUTANEOUS
  Administered 2015-12-17: 2 [IU] via SUBCUTANEOUS
  Administered 2015-12-17 (×2): 1 [IU] via SUBCUTANEOUS

## 2015-12-14 MED ORDER — ACETAMINOPHEN 650 MG RE SUPP: 650.0000 mg | Freq: Four times a day (QID) | RECTAL | Status: DC | PRN

## 2015-12-14 MED ORDER — AMIODARONE HCL 200 MG PO TABS
200.0000 mg | ORAL_TABLET | Freq: Two times a day (BID) | ORAL | Status: DC
  Administered 2015-12-14 – 2015-12-17 (×6): 200 mg via ORAL
  Filled 2015-12-14 (×6): qty 1

## 2015-12-14 MED ORDER — ONDANSETRON HCL 4 MG PO TABS: 4.0000 mg | ORAL_TABLET | Freq: Four times a day (QID) | ORAL | Status: DC | PRN

## 2015-12-14 MED ORDER — ONDANSETRON HCL 4 MG/2ML IJ SOLN: 4.0000 mg | Freq: Four times a day (QID) | INTRAMUSCULAR | Status: DC | PRN

## 2015-12-14 MED ORDER — LORAZEPAM 0.5 MG PO TABS
0.5000 mg | ORAL_TABLET | Freq: Two times a day (BID) | ORAL | Status: DC | PRN
  Administered 2015-12-14 – 2015-12-15 (×2): 0.5 mg via ORAL
  Filled 2015-12-14 (×2): qty 1

## 2015-12-14 MED ORDER — INSULIN NPH (HUMAN) (ISOPHANE) 100 UNIT/ML ~~LOC~~ SUSP
10.0000 [IU] | Freq: Every day | SUBCUTANEOUS | Status: DC
  Administered 2015-12-14 – 2015-12-16 (×3): 10 [IU] via SUBCUTANEOUS
  Filled 2015-12-14: qty 10

## 2015-12-14 MED ORDER — INSULIN NPH (HUMAN) (ISOPHANE) 100 UNIT/ML ~~LOC~~ SUSP
20.0000 [IU] | Freq: Every day | SUBCUTANEOUS | Status: DC
  Administered 2015-12-15 – 2015-12-17 (×2): 20 [IU] via SUBCUTANEOUS
  Filled 2015-12-14: qty 10

## 2015-12-14 MED ORDER — HYDROCODONE-ACETAMINOPHEN 5-325 MG PO TABS: 1.0000 | ORAL_TABLET | ORAL | Status: DC | PRN

## 2015-12-14 MED ORDER — TORSEMIDE 20 MG PO TABS
20.0000 mg | ORAL_TABLET | Freq: Two times a day (BID) | ORAL | Status: DC
  Administered 2015-12-15: 20 mg via ORAL
  Filled 2015-12-14: qty 1

## 2015-12-14 MED ORDER — LEVOTHYROXINE SODIUM 25 MCG PO TABS
25.0000 ug | ORAL_TABLET | Freq: Every day | ORAL | Status: DC
  Administered 2015-12-15 – 2015-12-17 (×3): 25 ug via ORAL
  Filled 2015-12-14 (×3): qty 1

## 2015-12-14 MED ORDER — SODIUM CHLORIDE 0.9% FLUSH
3.0000 mL | Freq: Two times a day (BID) | INTRAVENOUS | Status: DC
  Administered 2015-12-14 – 2015-12-17 (×5): 3 mL via INTRAVENOUS

## 2015-12-14 NOTE — ED Notes (Signed)
Ordered heart healthy food tray at this time.

## 2015-12-14 NOTE — ED Provider Notes (Signed)
[facility] DEPT Provider Note   CSN: [id number] Arrival date & time: 12/14/15  1155  First Provider Contact:  None       History   Chief Complaint Chief Complaint  Patient presents with  . Altered Mental Status  . Fatigue    HPI Destiny [name] is a 75 y.o. female.  HPI   75 year old female with hx of anemia, ischemic cardiomyopathy, CHF, CKD, DM, PAF currently on Coumadin presenting with Restlessness, generalized weakness, and increasing confusion. Patient was seen 9 days ago for shortness of breath and was found to be in a CHF exacerbation. She was given Lasix in the ER and was discharged home. Her doctor encourage patient to double up her torsemide, which she has been doing so for the past for 5 days. For the past 4 days she notices increased restlessness, with tremors throughout her extremities, having trouble sleeping, having trouble wearing her CPAP At night due to her tremors and report inadequate rest. Report weakness with ambulation.  She usually uses her walker. Husband noticed that patient appears to be more confused than usual, occasionally having slurring of speech and more drowsy. She is having to urinate more frequently. At nighttime, her blood sugar sometimes drops into the 40s. Her blood sugar is usually well controlled. Patient otherwise denies having fever, chills, headache, URI symptoms, chest pain, shortness of breath, abdominal pain, back pain, increased weight gain, heart palpitation, focal numbness, burning urination.   Past Medical History:  Diagnosis Date  . AICD (automatic cardioverter/defibrillator) present   . Anemia, unspecified 12/13/2013  . Back pain, chronic    "just when I walk; mass on 3rd and 4th vertebrae right lower back"  . Biventricular implantable cardiac defibrillator in situ 2007, 2012   a. 2007;  b. 2012 Gen change: SJM 3231-40 Uni BiV ICD, ser # [id number].  Marland Kitchen CAD (coronary artery disease)    a. 05/2001 CABG x4: LIMA->LAD, VG->D1, VG->D2,  VG->RCA.  . Cardiomyopathy, ischemic    a. 2012 s/p SJM 3231-40 Uni BiV ICD, ser # [id number].  . Carotid stenosis    a. 10/19/2011 carotid duplex - Mild hard plaque bilaterally. Stable 40-59% bilateral ICA stenosis. Carotid US (04/2013):  Bilateral 40-59% ICA.  F/u 1 year  . Cerumen impaction 10/28/2015  . Chronic combined systolic and diastolic CHF (congestive heart failure) (HCC)    a. EF as low as 25% in 2006;  b. EF 60-65% in 12/2011;  c. 02/2013 Echo: EF 35-40, mod mid-dist antsept HK, Gr 2 DD, mild LVH.  . CKD (chronic kidney disease), stage III   . 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 Dyslipidemia   . Gout ~ 08/2011  . Hypercalcemia 09/26/2013  . Hypertension   . Hypothyroidism   . Hypoxemia requiring supplemental oxygen   . LBBB (left bundle branch block)   . Morbid obesity with BMI of 45.0-49.9, adult (HCC)    Ht. 5'4". BMI 47.2  . Mural thrombus of left ventricle (HCC)    before 2003, while on Coumadin, No h/o CVA  . 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 Benay Spice, in remission  . OSA on CPAP   . PAF (paroxysmal atrial fibrillation) (HCC)    on Coumadin  . Presence of permanent cardiac pacemaker   . Retinopathy due to secondary diabetes ([place])    type II, uncontrolled  . Vitamin D deficiency 06/09/2012   historical  Patient Active Problem List   Diagnosis Date Noted  . Cerumen impaction 10/28/2015  . CHF exacerbation ([place]) 03/12/2015  . Hypoglycemia 10/25/2014  . Medicare annual wellness visit, subsequent 09/12/2014  . Coagulopathy ([place]) 07/29/2014  . Morbid obesity ([place]) 07/29/2014  . Type 2 diabetes mellitus, controlled, with renal complications ([place]) 123456  . Chronic combined systolic and diastolic CHF (congestive heart failure) ([place]) 07/28/2014  . Anemia 12/13/2013  . DOE (dyspnea on exertion) 11/30/2013  . Edema 09/26/2013  . Hypercalcemia 09/26/2013   . Encounter for therapeutic drug monitoring 06/10/2013  . Chronic combined systolic and diastolic heart failure ([place]) 05/01/2013  . Physical deconditioning 03/25/2013  . Hypothyroidism 03/06/2013  . Olecranon bursitis of right elbow 12/29/2012  . Vitamin D deficiency 06/09/2012  . History of Cardiomyopathy, ischemic   . OSA (obstructive sleep apnea) 09/05/2010  . LEG PAIN, BILATERAL 12/28/2009  . Diabetes mellitus type 2 with retinopathy ([place]) 10/28/2009  . ATRIAL FIBRILLATION, PAROXYSMAL 06/21/2009  . NHL (non-Hodgkin's lymphoma) ([place]) 04/21/2009  . MURAL THROMBUS, LEFT VENTRICLE 03/07/2009  . LYMPHEDEMA 03/07/2009  . LBBB 08/26/2008  . CVA 08/26/2008  . BACK PAIN, CHRONIC 08/26/2008  . Automatic implantable cardioverter-defibrillator in situ 08/26/2008  . UTI'S, RECURRENT 06/24/2008  . CAROTID BRUIT 05/20/2007  . Hyperlipidemia 03/19/2007  . Essential hypertension 11/20/2006    Past Surgical History:  Procedure Laterality Date  . ABDOMINAL HYSTERECTOMY  1982  . CARDIAC CATHETERIZATION  06/03/01  . CARDIOVERSION N/A 07/30/2014   Procedure: CARDIOVERSION;  Surgeon: Jolaine Artist, MD;  Location: [facility] ENDOSCOPY;  Service: Cardiovascular;  Laterality: N/A;  . CARDIOVERSION N/A 11/25/2014   Procedure: CARDIOVERSION;  Surgeon: Evans Lance, MD;  Location: [facility];  Service: Cardiovascular;  Laterality: N/A;  . CATARACT EXTRACTION     left eye  . CHOLECYSTECTOMY  1970  . CORONARY ARTERY BYPASS GRAFT  2003   CABG X4  . INSERT / REPLACE / REMOVE PACEMAKER  2007; 2012   w/AICD  . REFRACTIVE SURGERY     right eye  . TUBAL LIGATION  1972    OB History    No data available       Home Medications    Prior to Admission medications   Medication Sig Start Date End Date Taking? Authorizing Provider  acetaminophen (TYLENOL) 500 MG tablet Take 1,000 mg by mouth every 6 (six) hours as needed (pain).    Historical Provider, MD  albuterol (PROVENTIL HFA;VENTOLIN HFA) 108 (90  BASE) MCG/ACT inhaler Inhale 2 puffs into the lungs every 6 (six) hours as needed for wheezing or shortness of breath. 12/26/12   Mosie Lukes, MD  amiodarone (PACERONE) 200 MG tablet TAKE 1 TABLET TWICE DAILY Patient taking differently: TAKE 1 TABLET ONCE DAILY 11/25/15   Evans Lance, MD  atorvastatin (LIPITOR) 20 MG tablet TAKE 1 TABLET EVERY DAY 10/31/15   Mosie Lukes, MD  carvedilol (COREG) 25 MG tablet TAKE 2 TABLETS TWICE DAILY 08/30/15   Jolaine Artist, MD  chlorpheniramine (CHLOR-TRIMETON) 4 MG tablet Take 4 mg by mouth 2 (two) times daily.    Historical Provider, MD  escitalopram (LEXAPRO) 10 MG tablet TAKE 1 TABLET EVERY DAY 11/29/15   Mosie Lukes, MD  fenofibrate (TRICOR) 48 MG tablet TAKE 1 TABLET EVERY DAY 11/16/15   Jolaine Artist, MD  fluticasone (FLONASE) 50 MCG/ACT nasal spray Place 2 sprays into both nostrils daily as needed for allergies or rhinitis. As needed for nasal stuffiness. 01/13/14   [name] A  Charlett Blake, MD  gabapentin (NEURONTIN) 300 MG capsule TAKE 1 CAPSULE TWICE DAILY 08/03/15   Mosie Lukes, MD  glucose blood (FREESTYLE LITE) test strip DX: 250.60  Check sugars bid and as needed 05/22/13   Mosie Lukes, MD  insulin NPH (HUMULIN N,NOVOLIN N) 100 UNIT/ML injection Inject 15-30 Units into the skin 2 (two) times daily before a meal. Take 30 units in the morning and 15 units in the evening    Historical Provider, MD  insulin regular (NOVOLIN R,HUMULIN R) 100 units/mL injection Change to 15 units three times a with meals (to cover all meals--this is the same total amount but spread out) Patient taking differently: Inject 10 Units into the skin 2 (two) times daily before a meal. Change to 15 units three times a with meals (to cover all meals--this is the same total amount but spread out) 04/01/13   Ivan Anchors Love, PA-C  levothyroxine (SYNTHROID, LEVOTHROID) 25 MCG tablet TAKE 1 TABLET EVERY DAY BEFORE BREAKFAST 11/29/15   Mosie Lukes, MD  LORazepam (ATIVAN) 0.5 MG  tablet Take 1 tablet (0.5 mg total) by mouth at bedtime. 10/03/15   Mosie Lukes, MD  meclizine (ANTIVERT) 25 MG tablet Take 1 tablet (25 mg total) by mouth 3 (three) times daily as needed for dizziness. 10/18/15   Mosie Lukes, MD  montelukast (SINGULAIR) 10 MG tablet Take 1 tablet (10 mg total) by mouth at bedtime. Patient not taking: Reported on 12/05/2015 10/03/15   Darreld Mclean, MD  NON FORMULARY Oxygen---24 hour    Historical Provider, MD  predniSONE (DELTASONE) 10 MG tablet 4 tabs po x 2 days then 3 tabs po x 2 days then 1 tab po qd x 2 days then 1/2 tab po x 2 days. 10/18/15   Mosie Lukes, MD  torsemide (DEMADEX) 20 MG tablet TAKE 2 TABLETS EVERY MORNING, TAKE 1 TABLET IN THE AFTERNOON AND MAY TAKE AN EXTRA TABLET WITH A 3 POUND WEIGHT GAIN. 10/13/15   Mosie Lukes, MD  Vitamin D, Ergocalciferol, (DRISDOL) 50000 UNITS CAPS capsule Take 50,000 Units by mouth every 7 (seven) days. Tuesdays    Historical Provider, MD  warfarin (COUMADIN) 5 MG tablet TAKE AS DIRECTED BY ANTICOAGULATION CLINIC. Patient taking differently: TAKES 1 TAB ON SUN AND THURS, TAKES 1/2 TAB ALL OTHER DAYS 06/30/15   Evans Lance, MD    Family History Family History  Problem Relation Age of Onset  . Kidney cancer Mother     kidney and female repo - died @ 42  . Asthma Mother   . Cancer Mother     gyn and renal  . Heart disease Mother     CAD  . Heart disease Father     died @ 51  . Stroke Father   . Diabetes Father   . Hypertension Father   . Hyperlipidemia Father   . Heart attack Father   . Hypertension Son   . Kidney cancer Maternal Uncle   . Cancer Maternal Uncle   . Cirrhosis Maternal Grandmother     non alcohol  . Cancer Maternal Grandfather   . Kidney cancer Maternal Grandfather   . Hypertension Maternal Aunt     Social History Social History  Substance Use Topics  . Smoking status: Never Smoker  . Smokeless tobacco: Never Used  . Alcohol use No     Allergies   Sulfonamide  derivatives   Review of Systems Review of Systems  All other systems  reviewed and are negative.    Physical Exam Updated Vital Signs BP 132/60 (BP Location: Right Arm)   Pulse 67   Temp 98.8 F (37.1 C) (Oral)   Resp 19   SpO2 97%   Physical Exam  Constitutional: She is oriented to person, place, and time. She appears well-developed and well-nourished. No distress.  Morbidly obese caucasian female, sitting in bed in NAD, nontoxic  HENT:  Head: Atraumatic.  Mouth/Throat: Oropharynx is clear and moist.  Eyes: Conjunctivae are normal.  Neck: Neck supple. No JVD present.  Cardiovascular: Normal rate, regular rhythm and intact distal pulses.  Exam reveals no gallop and no friction rub.   No murmur heard. Pulmonary/Chest: Effort normal and breath sounds normal.  Abdominal: Soft.  Musculoskeletal: She exhibits no edema.  Neurological: She is alert and oriented to person, place, and time. GCS eye subscore is 4. GCS verbal subscore is 5. GCS motor subscore is 6.  Global weakness but equal strength to all 4 extremities  Skin: No rash noted.  Psychiatric: She has a normal mood and affect.  Nursing note and vitals reviewed.    ED Treatments / Results  Labs (all labs ordered are listed, but only abnormal results are displayed) Labs Reviewed  BASIC METABOLIC PANEL - Abnormal; Notable for the following:       Result Value   Potassium 5.5 (*)    Chloride 93 (*)    CO2 34 (*)    Glucose, Bld 113 (*)    BUN 57 (*)    Creatinine, Ser 3.38 (*)    GFR calc non Af Amer 13 (*)    GFR calc Af Amer 14 (*)    All other components within normal limits  CBC - Abnormal; Notable for the following:    RBC 3.45 (*)    Hemoglobin 10.5 (*)    HCT 34.8 (*)    MCV 100.9 (*)    RDW 17.7 (*)    All other components within normal limits  PROTIME-INR - Abnormal; Notable for the following:    Prothrombin Time 27.5 (*)    All other components within normal limits  I-STAT TROPOININ, ED     EKG  EKG Interpretation None       Radiology Dg Chest 2 View  Result Date: 12/14/2015 CLINICAL DATA:  Not being able to sleep for 3 days due to tremors, shaking, thick tongued, altered mental status, fatigue, history hypertension, chronic combined systolic and diastolic CHF, ischemic cardiomyopathy, coronary artery disease EXAM: CHEST  2 VIEW COMPARISON:  12/05/2015 FINDINGS: LEFT subclavian transvenous pacemaker/AICD leads project at RIGHT atrium, RIGHT ventricle and coronary sinus. Enlargement of cardiac silhouette post CABG. Pulmonary vascular congestion. Mediastinal contour stable. Slightly improved pulmonary edema since previous exam. No segmental consolidation, pleural effusion or pneumothorax. Bones demineralized. IMPRESSION: Enlargement cardiac silhouette post CABG and AICD with pulmonary vascular congestion and slightly improved pulmonary edema. Electronically Signed   By: Lavonia Dana M.D.   On: 12/14/2015 13:31    Procedures Procedures (including critical care time)  Medications Ordered in ED Medications - No data to display   Initial Impression / Assessment and Plan / ED Course  I have reviewed the triage vital signs and the nursing notes.  Pertinent labs & imaging results that were available during my care of the patient were reviewed by me and considered in my medical decision making (see chart for details).  Clinical Course    BP 127/61   Pulse 73   Temp 98.8  F (37.1 C) (Oral)   Resp 21   SpO2 97%    Final Clinical Impressions(s) / ED Diagnoses   Final diagnoses:  AKI (acute kidney injury) ([place])  Weakness  Hyperkalemia    New Prescriptions New Prescriptions   No medications on file    7:00 PM Patient here with generalized weakness. She was seen 9 days ago for a SOB and diagnosed with CHF exacerbation.  Her PCP increased her torsemide and now she's here with body tremors, muscle weakness, and restlessness.  On exam, she has global weakness but no  focal neuro deficits.  No pain and no evidence suggestive of infection.  Labs remarkabler for hyperkalemia with K+ 5.5, no EKG changes.  Increase Cr. Of 3.39, worsening than baseline.  Suspect her sxs likely 2/2 being overdiurese, and not having restful sleep due to not able to use her CPAP as normal.  Plan to have pt admit for obs.  Care discussed with Dr. Jeneen Rinks  7:04 PM Appreciate consultation from [facility], Dr. Roel Cluck who agrees to admit pt to telemetry floor, obseveration status for further care.  Pt agrees with plan.     Domenic Moras, PA-C 12/14/15 1905    Tanna Furry, MD 12/25/15 1949

## 2015-12-14 NOTE — ED Notes (Signed)
Pt. Unable to ambulate at this time. Pt. Reports being very weak since this issue started. EDP made aware.

## 2015-12-14 NOTE — ED Notes (Signed)
Pt used bedside toilet.  

## 2015-12-14 NOTE — H&P (Addendum)
Destiny [name] [id number] DOB: 11-Dec-1940 DOA: 12/14/2015     PCP: Penni Homans, MD   Outpatient Specialists: Cardiology Dr. Haroldine Laws Patient coming from:   home Lives With family    Chief Complaint: confusion fatigue  HPI: Destiny [name] is a 75 y.o. female with medical history significant of a.fib on Anticoagulation,   DM 2, CKD, benign systolic diastolic heart failure on home oxygen, OSA on CPAP, hx of NHL in remission    Presented with worsening confusion fatigue recurrent tremulousness and increased jerking movements. patient has not endorse any chest pain or shortness of breath.  Husband indorsed trouble speaking, worsening confusion. She have had episodes of hypoglycemia down to 40 at night..  Of note in 27th of July patient was complaining about weight gain and abdominal distention with the torsemide was increased up to 60 mg in the morning in 40 mg in afternoon until she is able to lose 8 pounds or able to follow-up with Dr. Haroldine Laws . Patient states when she first took the extra torsemide there was some response but soon her weight has come back up and she started to feel fatigued. Prior to this patient has been seen in emergency department 9 days ago for CHF exacerbation and treated with Lasix. Patient reports she fell 2 weeks ago hitting the back of her head.  reports decreased PO intake. No cough or fever. Endorses some constipation.  She feels a bit light headed when she stands up.    Regarding pertinent Chronic problems: reports DM has ben under good control on NPH. last Echo was June 2016 showing EF 25-30%   IN ER: a.febrile, HR 73, BP down to 90's WBC 8.0 Hg 10.5, K 5.5 Cr up to 3.38 from baseline of  2.6-2.95 INR 2.5 CXR showing improved CHF Trop 0.03,    Hospitalist was called for admission for acute on chronic renal failure in the setting of overdiuresis  Review of Systems:    Pertinent positives include: fatigue, confusion, slurred speech     Constitutional:  No weight loss, night sweats, Fevers, chills, weight loss  HEENT:  No headaches, Difficulty swallowing,Tooth/dental problems,Sore throat,  No sneezing, itching, ear ache, nasal congestion, post nasal drip,  Cardio-vascular:  No chest pain, Orthopnea, PND, anasarca, dizziness, palpitations.no Bilateral lower extremity swelling  GI:  No heartburn, indigestion, abdominal pain, nausea, vomiting, diarrhea, change in bowel habits, loss of appetite, melena, blood in stool, hematemesis Resp:  no shortness of breath at rest. No dyspnea on exertion, No excess mucus, no productive cough, No non-productive cough, No coughing up of blood.No change in color of mucus.No wheezing. Skin:  no rash or lesions. No jaundice GU:  no dysuria, change in color of urine, no urgency or frequency. No straining to urinate.  No flank pain.  Musculoskeletal:  No joint pain or no joint swelling. No decreased range of motion. No back pain.  Psych:  No change in mood or affect. No depression or anxiety. No memory loss.  Neuro: no localizing neurological complaints, no tingling, no weakness, no double vision, no gait abnormality, noAs per HPI otherwise 10 point review of systems negative.   Past Medical History: Past Medical History:  Diagnosis Date  . AICD (automatic cardioverter/defibrillator) present   . Anemia, unspecified 12/13/2013  . Back pain, chronic    "just when I walk; mass on 3rd and 4th vertebrae right lower back"  . Biventricular implantable cardiac defibrillator in situ 2007, 2012   a. 2007;  b.  2012 Gen change: SJM 3231-40 Uni BiV ICD, ser # [id number].  Marland Kitchen CAD (coronary artery disease)    a. 05/2001 CABG x4: LIMA->LAD, VG->D1, VG->D2, VG->RCA.  . Cardiomyopathy, ischemic    a. 2012 s/p SJM 3231-40 Uni BiV ICD, ser # [id number].  . Carotid stenosis    a. 10/19/2011 carotid duplex - Mild hard plaque bilaterally. Stable 40-59% bilateral ICA stenosis. Carotid US (04/2013):  Bilateral 40-59%  ICA.  F/u 1 year  . Cerumen impaction 10/28/2015  . Chronic combined systolic and diastolic CHF (congestive heart failure) (HCC)    a. EF as low as 25% in 2006;  b. EF 60-65% in 12/2011;  c. 02/2013 Echo: EF 35-40, mod mid-dist antsept HK, Gr 2 DD, mild LVH.  . CKD (chronic kidney disease), stage III   . 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 Dyslipidemia   . Gout ~ 08/2011  . Hypercalcemia 09/26/2013  . Hypertension   . Hypothyroidism   . Hypoxemia requiring supplemental oxygen   . LBBB (left bundle branch block)   . Morbid obesity with BMI of 45.0-49.9, adult (HCC)    Ht. 5'4". BMI 47.2  . Mural thrombus of left ventricle (HCC)    before 2003, while on Coumadin, No h/o CVA  . 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 Benay Spice, in remission  . OSA on CPAP   . PAF (paroxysmal atrial fibrillation) (HCC)    on Coumadin  . Presence of permanent cardiac pacemaker   . Retinopathy due to secondary diabetes ([place])    type II, uncontrolled  . Vitamin D deficiency 06/09/2012   historical   Past Surgical History:  Procedure Laterality Date  . ABDOMINAL HYSTERECTOMY  1982  . CARDIAC CATHETERIZATION  06/03/01  . CARDIOVERSION N/A 07/30/2014   Procedure: CARDIOVERSION;  Surgeon: Jolaine Artist, MD;  Location: [facility] ENDOSCOPY;  Service: Cardiovascular;  Laterality: N/A;  . CARDIOVERSION N/A 11/25/2014   Procedure: CARDIOVERSION;  Surgeon: Evans Lance, MD;  Location: [place];  Service: Cardiovascular;  Laterality: N/A;  . CATARACT EXTRACTION     left eye  . CHOLECYSTECTOMY  1970  . CORONARY ARTERY BYPASS GRAFT  2003   CABG X4  . INSERT / REPLACE / REMOVE PACEMAKER  2007; 2012   w/AICD  . REFRACTIVE SURGERY     right eye  . TUBAL LIGATION  1972     Social History:  Ambulatory  Gilford Rile     reports that she has never smoked. She has never used smokeless tobacco. She reports  that she does not drink alcohol or use drugs.  Allergies:   Allergies  Allergen Reactions  . Sulfonamide Derivatives Other (See Comments)    Unknown; "childhood allergy/mother"       Family History:   Family History  Problem Relation Age of Onset  . Kidney cancer Mother     kidney and female repo - died @ 83  . Asthma Mother   . Cancer Mother     gyn and renal  . Heart disease Mother     CAD  . Heart disease Father     died @ 81  . Stroke Father   . Diabetes Father   . Hypertension Father   . Hyperlipidemia Father   . Heart attack Father   . Hypertension Son   . Kidney cancer Maternal Uncle   . Cancer Maternal Uncle   . Cirrhosis  Maternal Grandmother     non alcohol  . Cancer Maternal Grandfather   . Kidney cancer Maternal Grandfather   . Hypertension Maternal Aunt     Medications: Prior to Admission medications   Medication Sig Start Date End Date Taking? Authorizing Provider  acetaminophen (TYLENOL) 500 MG tablet Take 1,000 mg by mouth 2 (two) times daily as needed for mild pain.    Yes Historical Provider, MD  albuterol (PROVENTIL HFA;VENTOLIN HFA) 108 (90 BASE) MCG/ACT inhaler Inhale 2 puffs into the lungs every 6 (six) hours as needed for wheezing or shortness of breath. 12/26/12  Yes Mosie Lukes, MD  amiodarone (PACERONE) 200 MG tablet TAKE 1 TABLET TWICE DAILY Patient taking differently: TAKE 1 TABLET ONCE DAILY 11/25/15  Yes Evans Lance, MD  atorvastatin (LIPITOR) 20 MG tablet TAKE 1 TABLET EVERY DAY 10/31/15  Yes Mosie Lukes, MD  carvedilol (COREG) 25 MG tablet TAKE 2 TABLETS TWICE DAILY 08/30/15  Yes Jolaine Artist, MD  chlorpheniramine (CHLOR-TRIMETON) 4 MG tablet Take 4 mg by mouth 2 (two) times daily.   Yes Historical Provider, MD  escitalopram (LEXAPRO) 10 MG tablet TAKE 1 TABLET EVERY DAY 11/29/15  Yes Mosie Lukes, MD  fenofibrate (TRICOR) 48 MG tablet TAKE 1 TABLET EVERY DAY 11/16/15  Yes Jolaine Artist, MD  fluticasone (FLONASE) 50  MCG/ACT nasal spray Place 2 sprays into both nostrils daily as needed for allergies or rhinitis. As needed for nasal stuffiness. 01/13/14  Yes Mosie Lukes, MD  gabapentin (NEURONTIN) 300 MG capsule TAKE 1 CAPSULE TWICE DAILY 08/03/15  Yes Mosie Lukes, MD  glucose blood (FREESTYLE LITE) test strip DX: 250.60  Check sugars bid and as needed 05/22/13  Yes Mosie Lukes, MD  insulin NPH (HUMULIN N,NOVOLIN N) 100 UNIT/ML injection Inject 15-30 Units into the skin 2 (two) times daily before a meal. Take 20-30 units in the morning and 10-15 units in the evening   Yes Historical Provider, MD  insulin regular (NOVOLIN R,HUMULIN R) 100 units/mL injection Change to 15 units three times a with meals (to cover all meals--this is the same total amount but spread out) Patient taking differently: Inject 10-15 Units into the skin 2 (two) times daily before a meal. Change to 15 units three times a with meals (to cover all meals--this is the same total amount but spread out) 04/01/13  Yes Ivan Anchors Love, PA-C  levothyroxine (SYNTHROID, LEVOTHROID) 25 MCG tablet TAKE 1 TABLET EVERY DAY BEFORE BREAKFAST 11/29/15  Yes Mosie Lukes, MD  LORazepam (ATIVAN) 0.5 MG tablet Take 1 tablet (0.5 mg total) by mouth at bedtime. 10/03/15  Yes Mosie Lukes, MD  OXYGEN Inhale 3 L into the lungs continuous.   Yes Historical Provider, MD  torsemide (DEMADEX) 20 MG tablet TAKE 2 TABLETS EVERY MORNING, TAKE 1 TABLET IN THE AFTERNOON AND MAY TAKE AN EXTRA TABLET WITH A 3 POUND WEIGHT GAIN. 10/13/15  Yes Mosie Lukes, MD  Vitamin D, Ergocalciferol, (DRISDOL) 50000 UNITS CAPS capsule Take 50,000 Units by mouth every Tuesday.    Yes Historical Provider, MD  warfarin (COUMADIN) 5 MG tablet TAKE AS DIRECTED BY ANTICOAGULATION CLINIC. Patient taking differently: TAKES 1 TAB (5 mg) ON SUN AND THURS, TAKES 1/2 TAB (2.5 mg) ALL OTHER DAYS 06/30/15  Yes Evans Lance, MD    Physical Exam: Patient Vitals for the past 24 hrs:  BP Temp Temp src  Pulse Resp SpO2  12/14/15 1830 127/61 - - 73  21 97 %  12/14/15 1730 145/72 - - 72 16 98 %  12/14/15 1707 112/55 - - 70 19 93 %  12/14/15 1700 124/58 - - 70 16 98 %  12/14/15 1630 133/55 - - 69 18 95 %  12/14/15 1600 125/60 - - 70 19 100 %  12/14/15 1531 132/60 - - 67 19 97 %  12/14/15 1212 140/80 98.8 F (37.1 C) Oral 68 24 94 %  12/14/15 1211 - - - - - 94 %    1. General:  in No Acute distress 2. Psychological: Alert and   Oriented 3. Head/ENT:    Dry Mucous Membranes                          Head Non traumatic, neck supple                          Normal  Dentition 4. SKIN:  decreased Skin turgor,  Skin clean Dry and intact no rash 5. Heart: Regular rate and rhythm no  Murmur, Rub or gallop 6. Lungs: no wheezes mild crackles  distant 7. Abdomen: Soft, non-tender,  distended, obese 8. Lower extremities: no clubbing, cyanosis, or edema 9. Neurologically Grossly intact, moving all 4 extremities equally 10. MSK: Normal range of motion   body mass index is unknown because there is no height or weight on file.  Labs on Admission:   Labs on Admission: I have personally reviewed following labs and imaging studies  CBC:  Recent Labs Lab 12/14/15 1232  WBC 8.0  HGB 10.5*  HCT 34.8*  MCV 100.9*  PLT [id number]   Basic Metabolic Panel:  Recent Labs Lab 12/14/15 1232  NA 135  K 5.5*  CL 93*  CO2 34*  GLUCOSE 113*  BUN 57*  CREATININE 3.38*  CALCIUM 9.2   GFR: Estimated Creatinine Clearance: 19 mL/min (by C-G formula based on SCr of 3.38 mg/dL). Liver Function Tests: No results for input(s): AST, ALT, ALKPHOS, BILITOT, PROT, ALBUMIN in the last 168 hours. No results for input(s): LIPASE, AMYLASE in the last 168 hours. No results for input(s): AMMONIA in the last 168 hours. Coagulation Profile:  Recent Labs Lab 12/14/15 1232  INR 2.51   Cardiac Enzymes: No results for input(s): CKTOTAL, CKMB, CKMBINDEX, TROPONINI in the last 168 hours. BNP (last 3 results) No  results for input(s): PROBNP in the last 8760 hours. HbA1C: No results for input(s): HGBA1C in the last 72 hours. CBG: No results for input(s): GLUCAP in the last 168 hours. Lipid Profile: No results for input(s): CHOL, HDL, LDLCALC, TRIG, CHOLHDL, LDLDIRECT in the last 72 hours. Thyroid Function Tests: No results for input(s): TSH, T4TOTAL, FREET4, T3FREE, THYROIDAB in the last 72 hours. Anemia Panel: No results for input(s): VITAMINB12, FOLATE, FERRITIN, TIBC, IRON, RETICCTPCT in the last 72 hours. Urine analysis:    Component Value Date/Time   COLORURINE YELLOW 09/25/2014 2336   APPEARANCEUR CLEAR 09/25/2014 2336   LABSPEC 1.015 09/25/2014 2336   PHURINE 5.5 09/25/2014 2336   GLUCOSEU 500 (A) 09/25/2014 2336   GLUCOSEU NEGATIVE 01/13/2009 1526   HGBUR NEGATIVE 09/25/2014 2336   HGBUR large 05/17/2008 1325   BILIRUBINUR NEGATIVE 09/25/2014 2336   KETONESUR NEGATIVE 09/25/2014 2336   PROTEINUR NEGATIVE 09/25/2014 2336   UROBILINOGEN 0.2 09/25/2014 2336   NITRITE NEGATIVE 09/25/2014 2336   LEUKOCYTESUR NEGATIVE 09/25/2014 2336   Sepsis Labs: @LABRCNTIP (procalcitonin:4,lacticidven:4) )No results found for this  or any previous visit (from the past 240 hour(s)).       UA ordered  Lab Results  Component Value Date   HGBA1C 7.2 (H) 09/26/2014    Estimated Creatinine Clearance: 19 mL/min (by C-G formula based on SCr of 3.38 mg/dL).  BNP (last 3 results) No results for input(s): PROBNP in the last 8760 hours.   ECG REPORT  Independently reviewed Rate: 70  Rhythm: Paced ST&T Change: No acute ischemic changes   QTC 524  There were no vitals filed for this visit.   Cultures:    Component Value Date/Time   SDES BLOOD LEFT HAND 10/25/2014 0405   SDES BLOOD RIGHT HAND 10/25/2014 0405   SPECREQUEST BOTTLES DRAWN AEROBIC ONLY 10CC 10/25/2014 0405   SPECREQUEST BOTTLES DRAWN AEROBIC ONLY 3CC 10/25/2014 0405   CULT  10/25/2014 0405    NO GROWTH 5 DAYS Performed at  [place]  10/25/2014 0405    NO GROWTH 5 DAYS Performed at [place] 10/31/2014 FINAL 10/25/2014 0405   REPTSTATUS 10/31/2014 FINAL 10/25/2014 0405     Radiological Exams on Admission: Dg Chest 2 View  Result Date: 12/14/2015 CLINICAL DATA:  Not being able to sleep for 3 days due to tremors, shaking, thick tongued, altered mental status, fatigue, history hypertension, chronic combined systolic and diastolic CHF, ischemic cardiomyopathy, coronary artery disease EXAM: CHEST  2 VIEW COMPARISON:  12/05/2015 FINDINGS: LEFT subclavian transvenous pacemaker/AICD leads project at RIGHT atrium, RIGHT ventricle and coronary sinus. Enlargement of cardiac silhouette post CABG. Pulmonary vascular congestion. Mediastinal contour stable. Slightly improved pulmonary edema since previous exam. No segmental consolidation, pleural effusion or pneumothorax. Bones demineralized. IMPRESSION: Enlargement cardiac silhouette post CABG and AICD with pulmonary vascular congestion and slightly improved pulmonary edema. Electronically Signed   By: Lavonia Dana M.D.   On: 12/14/2015 13:31    Chart has been reviewed    Assessment/Plan   75 y.o. female with medical history significant of a.fib on Anticoagulation, hx of CVA, DM 2, CKD, benign systolic diastolic heart failure on home oxygen, OSA on CPAP, hx of NHL in remission here with fatigue, confusion was found to have acute on chronic renal failure  Present on Admission: Acute encephalopathy - in the setting of over diuresis and polypharmacy VBG showed compensated hypercarbia currently appears to be alert and oriented. Given recent head trauma will obtain CT of the head. Patient status post Darcella Cheshire not a candidate for MRI . ATRIAL FIBRILLATION, PAROXYSMAL - CHA2DS2 vasc score - 6 on chronic anticoagulation will obtain CT scan of the head if  Unremarkable would Defer to cardiology given patient's debility and risk of fall if long  term anticoagulation continuation  Is waranted . Anemia chronic likely anemia of chronic disease currently at baseline . Automatic implantable cardioverter-defibrillator in situ - stable continue to monitor not an MRI candidate . Chronic combined systolic and diastolic CHF (congestive heart failure) ([place]) - possibly have being over diuresed. Patient still has abdominal distention but no peripheral swelling. Creatinine has gone up. Patient has been more fatigued. Decrease dose of torsemide tomorrow and for tonight give a gentle IV fluids. Cardiology consult in the morning . Diabetes mellitus type 2 with retinopathy ([place]) - given hypoglycemic episodes at home will hold long-lasting insulin for now continue sliding scale . Essential hypertension - blood pressure currently improved will monitor carefully restart Coreg . OSA (obstructive sleep apnea) - CPAP QHS  Given reports of constipation obtain KUB  to eval for stool burden  Other plan as per orders.  DVT prophylaxis:  couamdin  Code Status:  FULL CODE as per patient    Family Communication:   Family not at  Bedside     Disposition Plan:    To home once workup is complete and patient is stable   Consults called: emailed cardiology   Admission status:  obs    Level of care     tele         I have spent a total of 56 min on this admission    Jahmir Salo 12/14/2015, 8:12 PM    [facility]  Pager [phone]   after 2 AM please page floor coverage PA If 7AM-7PM, please contact the day team taking care of the patient  [email or web address]  Password [email or web address]

## 2015-12-14 NOTE — Progress Notes (Signed)
Pt. Has home cpap. Machine appears to be intact. No frayed wires or cords. Pt. Husband operates machine for pt.

## 2015-12-14 NOTE — ED Notes (Signed)
Attempted report 

## 2015-12-14 NOTE — ED Notes (Signed)
PA student at bedside.

## 2015-12-14 NOTE — ED Notes (Signed)
Admitting at bedside 

## 2015-12-14 NOTE — ED Notes (Signed)
Dinner tray ordered at 6:57 pm -TY

## 2015-12-14 NOTE — Progress Notes (Signed)
EPIC Encounter for ICM Monitoring  Patient Name: Destiny [name] is a 75 y.o. female Date: 12/14/2015 Primary Care Physican: Penni Homans, MD Primary Cardiologist: [place] Electrophysiologist: Druscilla Brownie Weight: unknown Bi-V Pacing:  >99%       Patient presented at ER today, 12/14/2015.    After taking increase of Furosemide on 12/08/2015, thoracic impedance continued to be abnormal suggesting fluid accumulation.     Appointment with Dr Haroldine Laws on 12/21/2015 and Dr Lovena Le on 12/28/2015.    ICM trend: 12/14/2015     Follow-up plan: ICM clinic phone appointment on 12/19/2015.  Copy of ICM check sent to primary cardiologist and device physician.   [name], RN 12/14/2015 1:48 PM

## 2015-12-14 NOTE — ED Triage Notes (Addendum)
Per family, pt has extensive cardiac hx and chf. Wears home O2 constant. Reports 3 days of not being able to sleep due to tremors, shaking and they report pt being "thick tongued", altered and fatigued. Denies any cp or sob. Pt appears pale at triage.

## 2015-12-15 ENCOUNTER — Observation Stay (HOSPITAL_BASED_OUTPATIENT_CLINIC_OR_DEPARTMENT_OTHER): Payer: Medicare Other

## 2015-12-15 DIAGNOSIS — I5022 Chronic systolic (congestive) heart failure: Secondary | ICD-10-CM

## 2015-12-15 DIAGNOSIS — R140 Abdominal distension (gaseous): Secondary | ICD-10-CM

## 2015-12-15 DIAGNOSIS — I509 Heart failure, unspecified: Secondary | ICD-10-CM | POA: Diagnosis not present

## 2015-12-15 DIAGNOSIS — G9340 Encephalopathy, unspecified: Secondary | ICD-10-CM | POA: Diagnosis not present

## 2015-12-15 DIAGNOSIS — N179 Acute kidney failure, unspecified: Secondary | ICD-10-CM

## 2015-12-15 LAB — ECHOCARDIOGRAM LIMITED
Height: 64 in
WEIGHTICAEL: 4348.8 [oz_av]

## 2015-12-15 LAB — BLOOD GAS, ARTERIAL
Acid-Base Excess: 7 mmol/L — ABNORMAL HIGH (ref 0.0–2.0)
Bicarbonate: 32.8 mEq/L — ABNORMAL HIGH (ref 20.0–24.0)
DRAWN BY: 105521
O2 CONTENT: 2 L/min
O2 Saturation: 88.2 %
PH ART: 7.325 — AB (ref 7.350–7.450)
Patient temperature: 98.6
TCO2: 34.8 mmol/L (ref 0–100)
pCO2 arterial: 64.8 mmHg (ref 35.0–45.0)
pO2, Arterial: 59.5 mmHg — ABNORMAL LOW (ref 80.0–100.0)

## 2015-12-15 LAB — URINALYSIS, ROUTINE W REFLEX MICROSCOPIC
BILIRUBIN URINE: NEGATIVE
Glucose, UA: NEGATIVE mg/dL
Hgb urine dipstick: NEGATIVE
KETONES UR: NEGATIVE mg/dL
NITRITE: NEGATIVE
Protein, ur: NEGATIVE mg/dL
Specific Gravity, Urine: 1.016 (ref 1.005–1.030)
pH: 5.5 (ref 5.0–8.0)

## 2015-12-15 LAB — COMPREHENSIVE METABOLIC PANEL
ALK PHOS: 33 U/L — AB (ref 38–126)
ALT: 17 U/L (ref 14–54)
AST: 24 U/L (ref 15–41)
Albumin: 3.6 g/dL (ref 3.5–5.0)
Anion gap: 5 (ref 5–15)
BILIRUBIN TOTAL: 0.4 mg/dL (ref 0.3–1.2)
BUN: 62 mg/dL — AB (ref 6–20)
CALCIUM: 8.7 mg/dL — AB (ref 8.9–10.3)
CHLORIDE: 96 mmol/L — AB (ref 101–111)
CO2: 35 mmol/L — ABNORMAL HIGH (ref 22–32)
CREATININE: 3.45 mg/dL — AB (ref 0.44–1.00)
GFR, EST AFRICAN AMERICAN: 14 mL/min — AB (ref >60)
GFR, EST NON AFRICAN AMERICAN: 12 mL/min — AB (ref >60)
Glucose, Bld: 97 mg/dL (ref 65–99)
Potassium: 5 mmol/L (ref 3.5–5.1)
Sodium: 136 mmol/L (ref 135–145)
TOTAL PROTEIN: 6.4 g/dL — AB (ref 6.5–8.1)

## 2015-12-15 LAB — URINE MICROSCOPIC-ADD ON: RBC / HPF: NONE SEEN RBC/hpf (ref 0–5)

## 2015-12-15 LAB — GLUCOSE, CAPILLARY
GLUCOSE-CAPILLARY: 165 mg/dL — AB (ref 65–99)
GLUCOSE-CAPILLARY: 86 mg/dL (ref 65–99)
GLUCOSE-CAPILLARY: 93 mg/dL (ref 65–99)
Glucose-Capillary: 123 mg/dL — ABNORMAL HIGH (ref 65–99)
Glucose-Capillary: 172 mg/dL — ABNORMAL HIGH (ref 65–99)
Glucose-Capillary: 188 mg/dL — ABNORMAL HIGH (ref 65–99)
Glucose-Capillary: 223 mg/dL — ABNORMAL HIGH (ref 65–99)

## 2015-12-15 LAB — CBC
HCT: 32.3 % — ABNORMAL LOW (ref 36.0–46.0)
Hemoglobin: 9.6 g/dL — ABNORMAL LOW (ref 12.0–15.0)
MCH: 29.9 pg (ref 26.0–34.0)
MCHC: 29.7 g/dL — ABNORMAL LOW (ref 30.0–36.0)
MCV: 100.6 fL — AB (ref 78.0–100.0)
PLATELETS: 223 10*3/uL (ref 150–400)
RBC: 3.21 MIL/uL — AB (ref 3.87–5.11)
RDW: 17.7 % — AB (ref 11.5–15.5)
WBC: 7.5 10*3/uL (ref 4.0–10.5)

## 2015-12-15 LAB — PROTIME-INR
INR: 2.24
Prothrombin Time: 25.1 seconds — ABNORMAL HIGH (ref 11.4–15.2)

## 2015-12-15 LAB — TROPONIN I
TROPONIN I: 0.03 ng/mL — AB (ref <0.03)
TROPONIN I: 0.03 ng/mL — AB (ref <0.03)

## 2015-12-15 LAB — MAGNESIUM: Magnesium: 2.4 mg/dL (ref 1.7–2.4)

## 2015-12-15 LAB — BRAIN NATRIURETIC PEPTIDE: B NATRIURETIC PEPTIDE 5: 758.8 pg/mL — AB (ref 0.0–100.0)

## 2015-12-15 LAB — BASIC METABOLIC PANEL
Anion gap: 5 (ref 5–15)
BUN: 62 mg/dL — AB (ref 6–20)
CALCIUM: 8.9 mg/dL (ref 8.9–10.3)
CO2: 34 mmol/L — ABNORMAL HIGH (ref 22–32)
CREATININE: 3.51 mg/dL — AB (ref 0.44–1.00)
Chloride: 94 mmol/L — ABNORMAL LOW (ref 101–111)
GFR calc Af Amer: 14 mL/min — ABNORMAL LOW (ref >60)
GFR, EST NON AFRICAN AMERICAN: 12 mL/min — AB (ref >60)
Glucose, Bld: 207 mg/dL — ABNORMAL HIGH (ref 65–99)
Potassium: 5.3 mmol/L — ABNORMAL HIGH (ref 3.5–5.1)
SODIUM: 133 mmol/L — AB (ref 135–145)

## 2015-12-15 LAB — PHOSPHORUS: Phosphorus: 4.1 mg/dL (ref 2.5–4.6)

## 2015-12-15 LAB — TSH: TSH: 4.27 u[IU]/mL (ref 0.350–4.500)

## 2015-12-15 MED ORDER — LORAZEPAM 0.5 MG PO TABS
0.5000 mg | ORAL_TABLET | Freq: Every evening | ORAL | Status: DC | PRN
  Administered 2015-12-15 – 2015-12-16 (×2): 0.5 mg via ORAL
  Filled 2015-12-15 (×2): qty 1

## 2015-12-15 MED ORDER — ALBUTEROL SULFATE (2.5 MG/3ML) 0.083% IN NEBU: 2.5000 mg | INHALATION_SOLUTION | Freq: Four times a day (QID) | RESPIRATORY_TRACT | Status: DC | PRN

## 2015-12-15 MED ORDER — FLUTICASONE PROPIONATE 50 MCG/ACT NA SUSP
2.0000 | Freq: Every day | NASAL | Status: DC | PRN
  Filled 2015-12-15: qty 16

## 2015-12-15 MED ORDER — ALBUTEROL SULFATE HFA 108 (90 BASE) MCG/ACT IN AERS: 2.0000 | INHALATION_SPRAY | Freq: Four times a day (QID) | RESPIRATORY_TRACT | Status: DC | PRN

## 2015-12-15 MED ORDER — GABAPENTIN 250 MG/5ML PO SOLN
150.0000 mg | Freq: Every day | ORAL | Status: DC
  Administered 2015-12-15 – 2015-12-16 (×2): 150 mg via ORAL
  Filled 2015-12-15 (×2): qty 3

## 2015-12-15 MED ORDER — ROPINIROLE HCL 0.25 MG PO TABS
0.2500 mg | ORAL_TABLET | Freq: Every day | ORAL | Status: DC
  Administered 2015-12-15 – 2015-12-16 (×2): 0.25 mg via ORAL
  Filled 2015-12-15 (×2): qty 1

## 2015-12-15 MED ORDER — SALINE SPRAY 0.65 % NA SOLN
1.0000 | NASAL | Status: DC | PRN
  Filled 2015-12-15 (×2): qty 44

## 2015-12-15 NOTE — Progress Notes (Signed)
Lab called with panic test results from [facility] as follows: Ph = 7.325 CO2 = 64.8 PO2 = 59.5 Bicarb = 32.8 Base Excess = +7 Sat = 88% Above with patient on 2L nasal cannula  Ella Bodo, patient's RN given above results.  She was unavailable when lab called with results.

## 2015-12-15 NOTE — Progress Notes (Addendum)
[facility] PROGRESS NOTE  Destiny [name] [id number] DOB: 05/04/1941 DOA: 12/14/2015   PCP: Penni Homans, MD     Assessment/Plan: Active Problems:   Diabetes mellitus type 2 with retinopathy ([place])   Essential hypertension   ATRIAL FIBRILLATION, PAROXYSMAL   Automatic implantable cardioverter-defibrillator in situ   OSA (obstructive sleep apnea)   Anemia   Type 2 diabetes mellitus, controlled, with renal complications (HCC)   Chronic combined systolic and diastolic CHF (congestive heart failure) (HCC)   Debility   Acute encephalopathy   75 y/o F who presented with worsening confusion, fatigue, recurrent tremulousness, and jerking movements. Admitting dx: acute encephalopathy in setting over diuresis and polypharmacy.  She has had some episodes of hypoglycemia down to 40 at night.  Pt was seen in ED ~9 days prior to current admission for CHF exa cerbation and treated with Lasix.  Pt fell 2 weeks ago hitting the back of her head. Pt's PMH includes AICD, chronic back pain, CKD, gout, LBBB, morbid obesity, non-hodgkin's lymphoma, CABG x4  Assessment and plan  Acute encephalopathy - in the setting of over diuresis and polypharmacy VBG showed compensated hypercarbia currently appears to be alert and oriented. Given recent head trauma will obtain CT of the head. Patient status post Darcella Cheshire not a candidate for MRI, I suspect that she has side effects from regular meds due to change in her renal clearance , CT head negative   . ATRIAL FIBRILLATION, PAROXYSMAL - CHA2DS2 vasc score - 6 on chronic anticoagulation , would Defer to cardiology given patient's debility and risk of fall if long term anticoagulation continuation  Is warranted  . Anemia chronic likely anemia of chronic disease currently at baseline  . Automatic implantable cardioverter-defibrillator in situ - stable continue to monitor not an MRI candidate  . Chronic combined systolic and diastolic CHF (congestive heart  failure) ([place]) - possibly have being over diuresed. Patient still has abdominal distention but no peripheral swelling. Creatinine has gone up. Patient has been more fatigued. Cards to dose   Torsemide . IVF discontinued . Cardiology to see Dr Aundra Dubin , ECHO with limited windows , cardiology to review   . Diabetes mellitus type 2 with retinopathy ([place]) - given hypoglycemic episodes at home will hold long-lasting insulin for now continue sliding scale  . Essential hypertension - blood pressure currently improved will monitor carefully restart Coreg  . OSA (obstructive sleep apnea) - CPAP QHS   Given reports of constipation obtain KUB to eval for stool burden which was negative   restless legs ,insomnia - I suspect RLS , started low dose requip and lower dose of gabapentin   DVT prophylaxsis   Code Status:  Full code    Family Communication: Discussed in detail with the patient's husband , all imaging results, lab results explained to the patient   Disposition Plan:  As per cardiology       Consultants:  cardiology  Procedures:  None   Antibiotics: Anti-infectives    None         HPI/Subjective: Falling asleep in the chair , husband says she is very restless at night , has not slept in 3 nights   Objective: Vitals:   12/15/15 0012 12/15/15 0541 12/15/15 1105 12/15/15 1212  BP: (!) 111/53 121/80 122/66 (!) 100/48  Pulse: 73 70  72  Resp: 18 18  20   Temp: 100.2 F (37.9 C) 98.2 F (36.8 C)  99 F (37.2 C)  TempSrc: Oral  Oral  Oral  SpO2: 93% 93%  90%  Weight:  123.3 kg (271 lb 12.8 oz)    Height:        Intake/Output Summary (Last 24 hours) at 12/15/15 1419 Last data filed at 12/15/15 1353  Gross per 24 hour  Intake          1306.67 ml  Output              325 ml  Net           981.67 ml    Exam:  Examination:  General exam: Appears calm and comfortable  Respiratory system: Clear to auscultation. Respiratory effort normal. Cardiovascular system: S1 &  S2 heard, RRR. No JVD, murmurs, rubs, gallops or clicks. No pedal edema. Gastrointestinal system: Abdomen is nondistended, soft and nontender. No organomegaly or masses felt. Normal bowel sounds heard. Central nervous system: Alert and oriented. No focal neurological deficits. Extremities: Symmetric 5 x 5 power. Skin: No rashes, lesions or ulcers Psychiatry: Judgement and insight appear normal. Mood & affect appropriate.     Data Reviewed: I have personally reviewed following labs and imaging studies  Micro Results No results found for this or any previous visit (from the past 240 hour(s)).  Radiology Reports Dg Chest 2 View  Result Date: 12/14/2015 CLINICAL DATA:  Not being able to sleep for 3 days due to tremors, shaking, thick tongued, altered mental status, fatigue, history hypertension, chronic combined systolic and diastolic CHF, ischemic cardiomyopathy, coronary artery disease EXAM: CHEST  2 VIEW COMPARISON:  12/05/2015 FINDINGS: LEFT subclavian transvenous pacemaker/AICD leads project at RIGHT atrium, RIGHT ventricle and coronary sinus. Enlargement of cardiac silhouette post CABG. Pulmonary vascular congestion. Mediastinal contour stable. Slightly improved pulmonary edema since previous exam. No segmental consolidation, pleural effusion or pneumothorax. Bones demineralized. IMPRESSION: Enlargement cardiac silhouette post CABG and AICD with pulmonary vascular congestion and slightly improved pulmonary edema. Electronically Signed   By: Lavonia Dana M.D.   On: 12/14/2015 13:31   Dg Chest 2 View  Result Date: 12/05/2015 CLINICAL DATA:  Decreased oxygen saturation. EXAM: CHEST  2 VIEW COMPARISON:  03/11/2015 FINDINGS: Stable dual lead cardiac pacemaker. Cardiomediastinal silhouette is stably enlarged. Mediastinal contours appear intact. Bilateral main pulmonary arteries are prominent. There is no evidence of pneumothorax. There is coarsening of the interstitial markings usually seen with  interstitial pulmonary edema. Osseous structures are without acute abnormality. Soft tissues are grossly normal. IMPRESSION: Findings suggestive of congestive heart failure with stable cardiomegaly and interval development of interstitial pulmonary edema. Probable pulmonary arterial hypertension. Electronically Signed   By: Fidela Salisbury M.D.   On: 12/05/2015 13:02  Dg Abd 1 View  Result Date: 12/14/2015 CLINICAL DATA:  Abdominal distension. EXAM: ABDOMEN - 1 VIEW COMPARISON:  Radiographs of March 19, 2013. FINDINGS: The bowel gas pattern is normal. No radio-opaque calculi or other significant radiographic abnormality are seen. IMPRESSION: No evidence of bowel obstruction or ileus. Electronically Signed   By: Marijo Conception, M.D.   On: 12/14/2015 21:51   Ct Head Wo Contrast  Result Date: 12/14/2015 CLINICAL DATA:  Acute encephalopathy. Worsening confusion, fatigue, recurrent tremulousness and jerking movements. EXAM: CT HEAD WITHOUT CONTRAST TECHNIQUE: Contiguous axial images were obtained from the base of the skull through the vertex without intravenous contrast. COMPARISON:  None. FINDINGS: Brain: No intracranial hemorrhage, mass effect, or midline shift. No hydrocephalus. The basilar cisterns are patent. Mild to moderate chronic small vessel ischemia. No evidence of territorial infarct. No intracranial fluid collection. Vascular:  No hyperdense vessel or abnormal calcification. Atherosclerosis of skullbase vasculature. Skull:  Calvarium is intact. Sinuses/Orbits: Included paranasal sinuses and mastoid air cells are well aerated. Other: None. IMPRESSION: Chronic small vessel ischemia without acute intracranial abnormality. Electronically Signed   By: Jeb Levering M.D.   On: 12/14/2015 21:28     CBC  Recent Labs Lab 12/14/15 1232 12/15/15 0423  WBC 8.0 7.5  HGB 10.5* 9.6*  HCT 34.8* 32.3*  PLT 222 223  MCV 100.9* 100.6*  MCH 30.4 29.9  MCHC 30.2 29.7*  RDW 17.7* 17.7*     Chemistries   Recent Labs Lab 12/14/15 1232 12/14/15 2325 12/15/15 0423  NA 135 133* 136  K 5.5* 5.3* 5.0  CL 93* 94* 96*  CO2 34* 34* 35*  GLUCOSE 113* 207* 97  BUN 57* 62* 62*  CREATININE 3.38* 3.51* 3.45*  CALCIUM 9.2 8.9 8.7*  MG  --   --  2.4  AST  --   --  24  ALT  --   --  17  ALKPHOS  --   --  33*  BILITOT  --   --  0.4   ------------------------------------------------------------------------------------------------------------------ estimated creatinine clearance is 18.8 mL/min (by C-G formula based on SCr of 3.45 mg/dL). ------------------------------------------------------------------------------------------------------------------ No results for input(s): HGBA1C in the last 72 hours. ------------------------------------------------------------------------------------------------------------------ No results for input(s): CHOL, HDL, LDLCALC, TRIG, CHOLHDL, LDLDIRECT in the last 72 hours. ------------------------------------------------------------------------------------------------------------------  Recent Labs  12/15/15 0423  TSH 4.270   ------------------------------------------------------------------------------------------------------------------ No results for input(s): VITAMINB12, FOLATE, FERRITIN, TIBC, IRON, RETICCTPCT in the last 72 hours.  Coagulation profile  Recent Labs Lab 12/14/15 1232 12/15/15 1027  INR 2.51 2.24    No results for input(s): DDIMER in the last 72 hours.  Cardiac Enzymes  Recent Labs Lab 12/14/15 2325 12/15/15 0423 12/15/15 1027  TROPONINI <0.03 0.03* 0.03*   ------------------------------------------------------------------------------------------------------------------ Invalid input(s): POCBNP   CBG:  Recent Labs Lab 12/14/15 2203 12/15/15 0004 12/15/15 0533 12/15/15 0837 12/15/15 1128  GLUCAP 164* 223* 86 188* 165*       Studies: Dg Chest 2 View  Result Date: 12/14/2015 CLINICAL DATA:   Not being able to sleep for 3 days due to tremors, shaking, thick tongued, altered mental status, fatigue, history hypertension, chronic combined systolic and diastolic CHF, ischemic cardiomyopathy, coronary artery disease EXAM: CHEST  2 VIEW COMPARISON:  12/05/2015 FINDINGS: LEFT subclavian transvenous pacemaker/AICD leads project at RIGHT atrium, RIGHT ventricle and coronary sinus. Enlargement of cardiac silhouette post CABG. Pulmonary vascular congestion. Mediastinal contour stable. Slightly improved pulmonary edema since previous exam. No segmental consolidation, pleural effusion or pneumothorax. Bones demineralized. IMPRESSION: Enlargement cardiac silhouette post CABG and AICD with pulmonary vascular congestion and slightly improved pulmonary edema. Electronically Signed   By: Lavonia Dana M.D.   On: 12/14/2015 13:31   Dg Abd 1 View  Result Date: 12/14/2015 CLINICAL DATA:  Abdominal distension. EXAM: ABDOMEN - 1 VIEW COMPARISON:  Radiographs of March 19, 2013. FINDINGS: The bowel gas pattern is normal. No radio-opaque calculi or other significant radiographic abnormality are seen. IMPRESSION: No evidence of bowel obstruction or ileus. Electronically Signed   By: Marijo Conception, M.D.   On: 12/14/2015 21:51   Ct Head Wo Contrast  Result Date: 12/14/2015 CLINICAL DATA:  Acute encephalopathy. Worsening confusion, fatigue, recurrent tremulousness and jerking movements. EXAM: CT HEAD WITHOUT CONTRAST TECHNIQUE: Contiguous axial images were obtained from the base of the skull through the vertex without intravenous contrast. COMPARISON:  None. FINDINGS: Brain: No intracranial hemorrhage, mass  effect, or midline shift. No hydrocephalus. The basilar cisterns are patent. Mild to moderate chronic small vessel ischemia. No evidence of territorial infarct. No intracranial fluid collection. Vascular: No hyperdense vessel or abnormal calcification. Atherosclerosis of skullbase vasculature. Skull:  Calvarium is intact.  Sinuses/Orbits: Included paranasal sinuses and mastoid air cells are well aerated. Other: None. IMPRESSION: Chronic small vessel ischemia without acute intracranial abnormality. Electronically Signed   By: Jeb Levering M.D.   On: 12/14/2015 21:28      Lab Results  Component Value Date   HGBA1C 7.2 (H) 09/26/2014   HGBA1C 7.3 (H) 03/06/2013   HGBA1C 7.9 (H) 09/25/2011   Lab Results  Component Value Date   MICROALBUR 77.0 (H) 03/28/2011   LDLCALC 68 07/14/2015   CREATININE 3.45 (H) 12/15/2015       Scheduled Meds: . amiodarone  200 mg Oral BID  . atorvastatin  20 mg Oral QHS  . carvedilol  50 mg Oral BID  . escitalopram  10 mg Oral Daily  . fenofibrate  54 mg Oral Daily  . insulin aspart  0-9 Units Subcutaneous Q4H  . insulin NPH Human  10 Units Subcutaneous QHS  . insulin NPH Human  20 Units Subcutaneous Q breakfast  . levothyroxine  25 mcg Oral QAC breakfast  . sodium chloride flush  3 mL Intravenous Q12H   Continuous Infusions:    LOS: 0 days    Time spent: >30 MINS    [facility]  [facility] Pager [phone]. If 7PM-7AM, please contact night-coverage at [email or web address], password [facility] 12/15/2015, 2:19 PM  LOS: 0 days

## 2015-12-15 NOTE — Progress Notes (Signed)
Pt. With critical Troponin value of 0.03 per lab. On call NP, Tylene Fantasia, made aware via amion, text page.

## 2015-12-15 NOTE — Evaluation (Deleted)
Occupational Therapy Evaluation Patient Details Name: Destiny [name] MRN: [id number] DOB: 16-Jun-1940 Today's Date: 12/15/2015    History of Present Illness Pt is a 75 y/o F who presented with worsening confusion, fatigue, recurrent tremulousness, and jerking movements. Admitting dx: acute encephalopathy in setting over diuresis and polypharmacy.  She has had some episodes of hypoglycemia down to 40 at night.  Pt was seen in ED ~9 days prior to current admission for CHF exa cerbation and treated with Lasix.  Pt fell 2 weeks ago hitting the back of her head. Pt's PMH includes AICD, chronic back pain, CKD, gout, LBBB, morbid obesity, non-hodgkin's lymphoma, CABG x4.   Clinical Impression   Pt with decline in function and safety with ADLs and ADL mobility with decreased strength, balance and endurance. Pt's BP remained WNL 121/72 throughout session seated and standing. Pt requires min A for transfers due to decreased balance and fatigue.  Her husband will be available to provide 24/7 assist/supervision at d/c. Pt has necessary DME and A/E at home. Pt would benefit form acute OT services to address impairments to increase level of function and safety      Follow Up Recommendations  No OT follow up;Supervision/Assistance - 24 hour    Equipment Recommendations  None recommended by OT    Recommendations for Other Services       Precautions / Restrictions Precautions Precautions: Fall Precaution Comments: Monitor BP Restrictions Weight Bearing Restrictions: No      Mobility Bed Mobility               General bed mobility comments: pt up in recliner  Transfers Overall transfer level: Needs assistance Equipment used: Rolling walker (2 wheeled) Transfers: Sit to/from Stand Sit to Stand: Min assist Stand pivot transfers: Min assist       General transfer comment:  min assist to steady using RW.      Balance Overall balance assessment: History of Falls;Needs  assistance Sitting-balance support: No upper extremity supported;Feet supported Sitting balance-Leahy Scale: Fair Sitting balance - Comments: Pt lethargic and falling asleep sitting EOB, requires close min guard assist   Standing balance support: Bilateral upper extremity supported;During functional activity Standing balance-Leahy Scale: Poor Standing balance comment: RW for support                            ADL Overall ADL's : Needs assistance/impaired     Grooming: Wash/dry hands;Wash/dry face;Standing;Min guard   Upper Body Bathing: Supervision/ safety;Set up;Sitting   Lower Body Bathing: Minimal assistance   Upper Body Dressing : Supervision/safety;Set up   Lower Body Dressing: Minimal assistance   Toilet Transfer: Minimal assistance;BSC;RW   Toileting- Clothing Manipulation and Hygiene: Min guard;Sit to/from stand       Functional mobility during ADLs: Minimal assistance;Rolling walker       Vision  no change from baseline              Pertinent Vitals/Pain Pain Assessment: No/denies pain     Hand Dominance Right   Extremity/Trunk Assessment Upper Extremity Assessment Upper Extremity Assessment: Generalized weakness   Lower Extremity Assessment Lower Extremity Assessment: Defer to PT evaluation       Communication Communication Communication: No difficulties   Cognition Arousal/Alertness: Alert Behavior During Therapy: WFL for tasks assessed/performed Overall Cognitive Status: Within Functional Limits for tasks assessed                     General  Comments   Pt pleasant and cooperative                 Home Living Family/patient expects to be discharged to:: Private residence Living Arrangements: Spouse/significant other Available Help at Discharge: Family;Available 24 hours/day Type of Home: House Home Access: Ramped entrance     Home Layout: One level     Bathroom Shower/Tub: Radiographer, therapeutic: Handicapped height     Home Equipment: Walker - 4 wheels;Grab bars - tub/shower;Hand held shower head;Shower seat;Electric scooter;Walker - 2 wheels;Wheelchair - manual          Prior Functioning/Environment Level of Independence: Independent with assistive device(s)        Comments: Ind ambulating household distances with 4 wheeled walker in home, scooter in community.  Dresses and showers Ind.      OT Diagnosis: Generalized weakness   OT Problem List: Decreased activity tolerance;Impaired balance (sitting and/or standing);Decreased strength;Obesity   OT Treatment/Interventions: Self-care/ADL training;DME and/or AE instruction;Therapeutic activities;Patient/family education    OT Goals(Current goals can be found in the care plan section) Acute Rehab OT Goals Patient Stated Goal: go home today OT Goal Formulation: With patient Time For Goal Achievement: 12/22/15 Potential to Achieve Goals: Good ADL Goals Pt Will Perform Grooming: with set-up;with supervision;standing Pt Will Perform Upper Body Bathing: with modified independence;with set-up Pt Will Perform Lower Body Bathing: with set-up;with min guard assist;with supervision Pt Will Perform Upper Body Dressing: with set-up;with modified independence Pt Will Perform Lower Body Dressing: with supervision;with set-up;with min guard assist Pt Will Transfer to Toilet: with min guard assist;with supervision;bedside commode;regular height toilet;ambulating;grab bars Pt Will Perform Toileting - Clothing Manipulation and hygiene: with supervision;sit to/from stand Pt Will Perform Tub/Shower Transfer: with min guard assist;with supervision;shower seat;grab bars  OT Frequency: Min 2X/week   Barriers to D/C:    none, husband can assist 24/7                     End of Session Equipment Utilized During Treatment: Gait belt;Rolling walker;Other (comment) (BSC)  Activity Tolerance:   Patient left: in chair;with call  bell/phone within reach;with chair alarm set   Time:  -    Charges:  OT General Charges $OT Visit: 1 Procedure OT Evaluation $OT Eval Moderate Complexity: 1 Procedure G-Codes:    Destiny [name] 12/15/2015, 1:53 PM

## 2015-12-15 NOTE — Progress Notes (Signed)
CRITICAL VALUE ALERT  Critical value received:  PH 7.325, CO2 64.8, PO259.5, Bicarb 32.8, Base Excess +7, Sat 88% with 2L [place]  Date of notification:  12/15/2015  Time of notification:  12.:46  Critical value read back:Yes.    Nurse who received alert:  Doona Owens/Diannie Willner Dorice Lamas  MD notified (1st page):  Allyson Sabal, MD  Time of first page:  12:55  MD notified (2nd page):  Time of second page:  Responding MD:  Allyson Sabal, MD  Time MD responded:  13.08

## 2015-12-15 NOTE — Evaluation (Signed)
Occupational Therapy Evaluation Patient Details Name: Destiny [name] MRN: [id number] DOB: Feb 20, 1941 Today's Date: 12/15/2015    History of Present Illness Pt is a 75 y/o F who presented with worsening confusion, fatigue, recurrent tremulousness, and jerking movements. Admitting dx: acute encephalopathy in setting over diuresis and polypharmacy.  She has had some episodes of hypoglycemia down to 40 at night.  Pt was seen in ED ~9 days prior to current admission for CHF exa cerbation and treated with Lasix.  Pt fell 2 weeks ago hitting the back of her head. Pt's PMH includes AICD, chronic back pain, CKD, gout, LBBB, morbid obesity, non-hodgkin's lymphoma, CABG x4.   Clinical Impression   Pt with decline in function and safety with ADLs and ADL mobility with decreased strength, balance and endurance. Pt's BP remained WNL 121/72 throughout session seated and standing. Pt requires min A for transfers due to decreased balance and fatigue. Her husband will be available to provide 24/7 assist/supervision at d/c.Pt has necessary DME and A/E at home. Pt would benefit form acute OT services to address impairments to increase level of function and safety     Follow Up Recommendations  No OT follow up;Supervision/Assistance - 24 hour    Equipment Recommendations  None recommended by OT    Recommendations for Other Services       Precautions / Restrictions Precautions Precautions: Fall Precaution Comments: Monitor BP Restrictions Weight Bearing Restrictions: No      Mobility Bed Mobility               General bed mobility comments: pt up in recliner  Transfers Overall transfer level: Needs assistance Equipment used: Rolling walker (2 wheeled) Transfers: Sit to/from Stand Sit to Stand: Min assist Stand pivot transfers: Min assist       General transfer comment:  min assist to steady using RW.      Balance Overall balance assessment: History of Falls;Needs  assistance Sitting-balance support: No upper extremity supported;Feet supported Sitting balance-Leahy Scale: Fair Sitting balance - Comments: Pt lethargic and falling asleep sitting EOB, requires close min guard assist   Standing balance support: Bilateral upper extremity supported;During functional activity Standing balance-Leahy Scale: Poor Standing balance comment: RW for support                            ADL Overall ADL's : Needs assistance/impaired     Grooming: Wash/dry hands;Wash/dry face;Standing;Min guard   Upper Body Bathing: Supervision/ safety;Set up;Sitting   Lower Body Bathing: Minimal assistance   Upper Body Dressing : Supervision/safety;Set up   Lower Body Dressing: Minimal assistance   Toilet Transfer: Minimal assistance;BSC;RW   Toileting- Clothing Manipulation and Hygiene: Min guard;Sit to/from stand       Functional mobility during ADLs: Minimal assistance;Rolling walker       Vision  no change from baseline              Pertinent Vitals/Pain Pain Assessment: No/denies pain     Hand Dominance Right   Extremity/Trunk Assessment Upper Extremity Assessment Upper Extremity Assessment: Generalized weakness   Lower Extremity Assessment Lower Extremity Assessment: Defer to PT evaluation       Communication Communication Communication: No difficulties   Cognition Arousal/Alertness: Lethargic Behavior During Therapy: WFL for tasks assessed/performed Overall Cognitive Status: Within Functional Limits for tasks assessed                     General Comments  Pt pleasant and cooperative, feeling better                 Home Living Family/patient expects to be discharged to:: Private residence Living Arrangements: Spouse/significant other Available Help at Discharge: Family;Available 24 hours/day Type of Home: House Home Access: Ramped entrance     Home Layout: One level     Bathroom Shower/Tub: Emergency planning/management officer: Handicapped height     Home Equipment: Walker - 4 wheels;Grab bars - tub/shower;Hand held shower head;Shower seat;Electric scooter;Walker - 2 wheels;Wheelchair - manual          Prior Functioning/Environment Level of Independence: Independent with assistive device(s)        Comments: Ind ambulating household distances with 4 wheeled walker in home, scooter in community.  Dresses and showers Ind.      OT Diagnosis: Generalized weakness   OT Problem List: Decreased activity tolerance;Impaired balance (sitting and/or standing);Decreased strength;Obesity   OT Treatment/Interventions: Self-care/ADL training;DME and/or AE instruction;Therapeutic activities;Patient/family education    OT Goals(Current goals can be found in the care plan section) Acute Rehab OT Goals Patient Stated Goal: go home today OT Goal Formulation: With patient Time For Goal Achievement: 12/22/15 Potential to Achieve Goals: Good ADL Goals Pt Will Perform Grooming: with set-up;with supervision;standing Pt Will Perform Upper Body Bathing: with modified independence;with set-up Pt Will Perform Lower Body Bathing: with set-up;with min guard assist;with supervision Pt Will Perform Upper Body Dressing: with set-up;with modified independence Pt Will Perform Lower Body Dressing: with supervision;with set-up;with min guard assist Pt Will Transfer to Toilet: with min guard assist;with supervision;bedside commode;regular height toilet;ambulating;grab bars Pt Will Perform Toileting - Clothing Manipulation and hygiene: with supervision;sit to/from stand Pt Will Perform Tub/Shower Transfer: with min guard assist;with supervision;shower seat;grab bars  OT Frequency: Min 2X/week   Barriers to D/C:    none, husband can assist 24/7                     End of Session Equipment Utilized During Treatment: Gait belt;Rolling walker;Other (comment) (BSC)  Activity Tolerance:   Patient left: in  chair;with call bell/phone within reach;with chair alarm set     Charges:  OT General Charges $OT Visit: 1 Procedure OT Evaluation $OT Eval Moderate Complexity: 1 Procedure G-Codes: OT G-codes **NOT FOR INPATIENT CLASS** Functional Assessment Tool Used: clinical judgement Functional Limitation: Self care Self Care Current Status [id number]): At least 20 percent but less than 40 percent impaired, limited or restricted Self Care Goal Status [id number]): At least 1 percent but less than 20 percent impaired, limited or restricted  Britt Bottom 12/15/2015, 2:02 PM

## 2015-12-15 NOTE — Care Management Obs Status (Signed)
[facility] NOTIFICATION   Patient Details  Name: Destiny [name] MRN: [id number] Date of Birth: Oct 14, 1940   Medicare Observation Status Notification Given:  Yes    Royston Bake, RN 12/15/2015, 10:15 AM

## 2015-12-15 NOTE — Progress Notes (Signed)
  Echocardiogram 2D Echocardiogram Limited has been performed.  Darlina Sicilian M 12/15/2015, 12:51 PM

## 2015-12-15 NOTE — Consult Note (Signed)
Advanced Heart Failure Team Consult Note  Referring Physician: Dr Allyson Sabal PCP: Dr Charlett Blake Nephrologist: Dr Marval Regal EP: Dr Lovena Le  Pulmonary: Dr Gwenette Greet  Reason for Consultation: AKI with chronic combined CHF  HPI:    Destiny [name] is a 75 y.o. female with a history of CAD, status post CABG in 2003, ischemic cardiomyopathy with variable ejection fractions noted in the past (echo in 12/2011 demonstrated normal LV function with an EF 60-65%), status post CRT-D, chronic combined systolic and diastolic CHF, paroxysmal atrial fibrillation, prior LV mural thrombus, on chronic Coumadin therapy, CKD, T2DM.  Pt last seen in HF clinic 05/18/15. Was thought to be stable.   PT admitted to [facility] 12/14/15 with worsening confusion and tremulousness and was found to have AKI. Reports Hypoglycemia at home as low as 40 mg/dL at night. Pertinent labs on admission include K 5.5, Creatinine 3.38, BNP 758.8. CXR with improved pulmonary edema since last exam (12/05/15). CT head with no acute changes.   Denies CP or SOB.  Weight at home only up from 267 to 271, and this over several days. Has been taking medications as directed. Recently treated in ED for CHF exacerbation with IV lasix.  Po torsemide recently increased as outpatient.  States her weight was up and down at home.   Corevue: Shows thoracic impedence down.  Review of Systems: [y] = yes, [ ]  = no   General: Weight gain [y]; Weight loss [ ] ; Anorexia [ ] ; Fatigue [ ] ; Fever [ ] ; Chills [ ] ; Weakness [ ]   Cardiac: Chest pain/pressure [ ] ; Resting SOB [ ] ; Exertional SOB [ ] ; Orthopnea [ ] ; Pedal Edema [ ] ; Palpitations [ ] ; Syncope [ ] ; Presyncope [ ] ; Paroxysmal nocturnal dyspnea[ ]   Pulmonary: Cough [ ] ; Wheezing[ ] ; Hemoptysis[ ] ; Sputum [ ] ; Snoring [ ]   GI: Vomiting[ ] ; Dysphagia[ ] ; Melena[ ] ; Hematochezia [ ] ; Heartburn[ ] ; Abdominal pain [ ] ; Constipation [ ] ; Diarrhea [ ] ; BRBPR [ ]   GU: Hematuria[ ] ; Dysuria [ ] ; Nocturia[ ]   Vascular: Pain in  legs with walking [ ] ; Pain in feet with lying flat [ ] ; Non-healing sores [ ] ; Stroke [ ] ; TIA [ ] ; Slurred speech [ ] ;  Neuro: Headaches[ ] ; Vertigo[ ] ; Seizures[ ] ; Paresthesias[ ] ;Blurred vision [ ] ; Diplopia [ ] ; Vision changes [ ]  Confusion [y] Ortho/Skin: Arthritis [y]; Joint pain [y]; Muscle pain [ ] ; Joint swelling [ ] ; Back Pain [ ] ; Rash [ ]   Psych: Depression[ ] ; Anxiety[ ]   Heme: Bleeding problems [ ] ; Clotting disorders [ ] ; Anemia [ ]   Endocrine: Diabetes [y]; Thyroid dysfunction[ ]   Home Medications Prior to Admission medications   Medication Sig Start Date End Date Taking? Authorizing Provider  acetaminophen (TYLENOL) 500 MG tablet Take 1,000 mg by mouth 2 (two) times daily as needed for mild pain.    Yes Historical Provider, MD  albuterol (PROVENTIL HFA;VENTOLIN HFA) 108 (90 BASE) MCG/ACT inhaler Inhale 2 puffs into the lungs every 6 (six) hours as needed for wheezing or shortness of breath. 12/26/12  Yes Mosie Lukes, MD  amiodarone (PACERONE) 200 MG tablet TAKE 1 TABLET TWICE DAILY Patient taking differently: TAKE 1 TABLET ONCE DAILY 11/25/15  Yes Evans Lance, MD  atorvastatin (LIPITOR) 20 MG tablet TAKE 1 TABLET EVERY DAY 10/31/15  Yes Mosie Lukes, MD  carvedilol (COREG) 25 MG tablet TAKE 2 TABLETS TWICE DAILY 08/30/15  Yes Jolaine Artist, MD  chlorpheniramine (CHLOR-TRIMETON) 4 MG tablet Take  4 mg by mouth 2 (two) times daily.   Yes Historical Provider, MD  escitalopram (LEXAPRO) 10 MG tablet TAKE 1 TABLET EVERY DAY 11/29/15  Yes Mosie Lukes, MD  fenofibrate (TRICOR) 48 MG tablet TAKE 1 TABLET EVERY DAY 11/16/15  Yes Jolaine Artist, MD  fluticasone (FLONASE) 50 MCG/ACT nasal spray Place 2 sprays into both nostrils daily as needed for allergies or rhinitis. As needed for nasal stuffiness. 01/13/14  Yes Mosie Lukes, MD  gabapentin (NEURONTIN) 300 MG capsule TAKE 1 CAPSULE TWICE DAILY 08/03/15  Yes Mosie Lukes, MD  glucose blood (FREESTYLE LITE) test strip DX:  250.60  Check sugars bid and as needed 05/22/13  Yes Mosie Lukes, MD  insulin NPH (HUMULIN N,NOVOLIN N) 100 UNIT/ML injection Inject 15-30 Units into the skin 2 (two) times daily before a meal. Take 20-30 units in the morning and 10-15 units in the evening   Yes Historical Provider, MD  insulin regular (NOVOLIN R,HUMULIN R) 100 units/mL injection Change to 15 units three times a with meals (to cover all meals--this is the same total amount but spread out) Patient taking differently: Inject 10-15 Units into the skin 2 (two) times daily before a meal. Change to 15 units three times a with meals (to cover all meals--this is the same total amount but spread out) 04/01/13  Yes Ivan Anchors Love, PA-C  levothyroxine (SYNTHROID, LEVOTHROID) 25 MCG tablet TAKE 1 TABLET EVERY DAY BEFORE BREAKFAST 11/29/15  Yes Mosie Lukes, MD  LORazepam (ATIVAN) 0.5 MG tablet Take 1 tablet (0.5 mg total) by mouth at bedtime. 10/03/15  Yes Mosie Lukes, MD  OXYGEN Inhale 3 L into the lungs continuous.   Yes Historical Provider, MD  torsemide (DEMADEX) 20 MG tablet TAKE 2 TABLETS EVERY MORNING, TAKE 1 TABLET IN THE AFTERNOON AND MAY TAKE AN EXTRA TABLET WITH A 3 POUND WEIGHT GAIN. 10/13/15  Yes Mosie Lukes, MD  Vitamin D, Ergocalciferol, (DRISDOL) 50000 UNITS CAPS capsule Take 50,000 Units by mouth every Tuesday.    Yes Historical Provider, MD  warfarin (COUMADIN) 5 MG tablet TAKE AS DIRECTED BY ANTICOAGULATION CLINIC. Patient taking differently: TAKES 1 TAB (5 mg) ON SUN AND THURS, TAKES 1/2 TAB (2.5 mg) ALL OTHER DAYS 06/30/15  Yes Evans Lance, MD    Past Medical History: Past Medical History:  Diagnosis Date  . AICD (automatic cardioverter/defibrillator) present   . Anemia, unspecified 12/13/2013  . Back pain, chronic    "just when I walk; mass on 3rd and 4th vertebrae right lower back"  . Biventricular implantable cardiac defibrillator in situ 2007, 2012   a. 2007;  b. 2012 Gen change: SJM 3231-40 Uni BiV ICD, ser #  [id number].  Marland Kitchen CAD (coronary artery disease)    a. 05/2001 CABG x4: LIMA->LAD, VG->D1, VG->D2, VG->RCA.  . Cardiomyopathy, ischemic    a. 2012 s/p SJM 3231-40 Uni BiV ICD, ser # [id number].  . Carotid stenosis    a. 10/19/2011 carotid duplex - Mild hard plaque bilaterally. Stable 40-59% bilateral ICA stenosis. Carotid US (04/2013):  Bilateral 40-59% ICA.  F/u 1 year  . Cerumen impaction 10/28/2015  . Chronic combined systolic and diastolic CHF (congestive heart failure) (HCC)    a. EF as low as 25% in 2006;  b. EF 60-65% in 12/2011;  c. 02/2013 Echo: EF 35-40, mod mid-dist antsept HK, Gr 2 DD, mild LVH.  . CKD (chronic kidney disease), stage III   . DM (diabetes mellitus), type 2  with complications (HCC)    insulin dependent, retinopathy, neuropathy  . Dyslipidemia   . Gout ~ 08/2011  . Hypercalcemia 09/26/2013  . Hypertension   . Hypothyroidism   . Hypoxemia requiring supplemental oxygen   . LBBB (left bundle branch block)   . Morbid obesity with BMI of 45.0-49.9, adult (HCC)    Ht. 5'4". BMI 47.2  . Mural thrombus of left ventricle (HCC)    before 2003, while on Coumadin, No h/o CVA  . Non-Hodgkin's lymphoma of inguinal region ([place]) 02/2009   mass; left; B-type; Dr. Benay Spice, in remission  . On home oxygen therapy    "normally 2-3L; 24/7" (12/14/2015)  . OSA on CPAP   . PAF (paroxysmal atrial fibrillation) (HCC)    on Coumadin  . Presence of permanent cardiac pacemaker   . 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 Vitamin D deficiency 06/09/2012   historical    Past Surgical History: Past Surgical History:  Procedure Laterality Date  . ABDOMINAL HYSTERECTOMY  1982  . CARDIAC CATHETERIZATION  06/03/01  . CARDIOVERSION N/A 07/30/2014   Procedure: CARDIOVERSION;  Surgeon: Jolaine Artist, MD;  Location: [facility] ENDOSCOPY;  Service: Cardiovascular;  Laterality: N/A;  . CARDIOVERSION N/A 11/25/2014   Procedure: CARDIOVERSION;  Surgeon: Evans Lance, MD;  Location:  [place];  Service: Cardiovascular;  Laterality: N/A;  . CATARACT EXTRACTION     left eye  . CHOLECYSTECTOMY  1970  . CORONARY ARTERY BYPASS GRAFT  2003   CABG X4  . INSERT / REPLACE / REMOVE PACEMAKER  2007; 2012   w/AICD  . REFRACTIVE SURGERY     right eye  . TUBAL LIGATION  1972    Family History: Family History  Problem Relation Age of Onset  . Kidney cancer Mother     kidney and female repo - died @ 35  . Asthma Mother   . Cancer Mother     gyn and renal  . Heart disease Mother     CAD  . Heart disease Father     died @ 23  . Stroke Father   . Diabetes Father   . Hypertension Father   . Hyperlipidemia Father   . Heart attack Father   . Hypertension Son   . Kidney cancer Maternal Uncle   . Cancer Maternal Uncle   . Cirrhosis Maternal Grandmother     non alcohol  . Cancer Maternal Grandfather   . Kidney cancer Maternal Grandfather   . Hypertension Maternal Aunt     Social History: Social History   Social History  . Marital status: Married    Spouse name: N/A  . Number of children: Y  . Years of education: N/A   Occupational History  . 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    Social History Main Topics  . Smoking status: Never Smoker  . Smokeless tobacco: Never Used  . Alcohol use No  . Drug use: No  . Sexual activity: Not Asked     Comment: lives with husband, no major dietary restrictions   Other Topics Concern  . None   Social History Narrative   Lives in [place] with her husband.  She does not routinely exercise or adhere to any particular diet.      Allergies:  Allergies  Allergen Reactions  . Sulfonamide Derivatives Other (See Comments)    Unknown; "childhood allergy/mother"    Objective:    Vital  Signs:   Temp:  [98.2 F (36.8 C)-100.2 F (37.9 C)] 99 F (37.2 C) (08/03 1212) Pulse Rate:  [69-73] 72 (08/03 1212) Resp:  [16-21] 20 (08/03 1212) BP: (100-145)/(48-94) 100/48 (08/03 1212) SpO2:  [90 %-100 %] 90 %  (08/03 1212) Weight:  [271 lb 12.8 oz (123.3 kg)-272 lb 4.8 oz (123.5 kg)] 271 lb 12.8 oz (123.3 kg) (08/03 0541) Last BM Date: 12/13/15  Weight change: Filed Weights   12/14/15 2020 12/15/15 0541  Weight: 272 lb 4.8 oz (123.5 kg) 271 lb 12.8 oz (123.3 kg)    Intake/Output:   Intake/Output Summary (Last 24 hours) at 12/15/15 1543 Last data filed at 12/15/15 1353  Gross per 24 hour  Intake          1306.67 ml  Output              325 ml  Net           981.67 ml     Physical Exam: General:  Morbidly obese, Elderly appearing. Husband present  HEENT: Normal Neck: JVP difficult to assess but does not appear elevated. Carotids 2+ bilaterally; no bruits. No thyromegaly or nodule noted. Cor: PMI normal. RRR. No gallops or rubs.  1/6 early SEM RUSB. Lungs: Clear, On 02.  Abdomen: Obese, soft, non-tender, mildly distended, no HSM. No bruits or masses. +BS  Extremities: no cyanosis, clubbing, rash. No peripheral edema.  Neuro: A&Ox3, cranial nerves grossly intact. Moves all 4 extremities w/o difficulty. Affect pleasant.  Telemetry: Reviewed, paced in 70s  Labs: Basic Metabolic Panel:  Recent Labs Lab 12/14/15 1232 12/14/15 2325 12/15/15 0423  NA 135 133* 136  K 5.5* 5.3* 5.0  CL 93* 94* 96*  CO2 34* 34* 35*  GLUCOSE 113* 207* 97  BUN 57* 62* 62*  CREATININE 3.38* 3.51* 3.45*  CALCIUM 9.2 8.9 8.7*  MG  --   --  2.4  PHOS  --   --  4.1    Liver Function Tests:  Recent Labs Lab 12/15/15 0423  AST 24  ALT 17  ALKPHOS 33*  BILITOT 0.4  PROT 6.4*  ALBUMIN 3.6   No results for input(s): LIPASE, AMYLASE in the last 168 hours. No results for input(s): AMMONIA in the last 168 hours.  CBC:  Recent Labs Lab 12/14/15 1232 12/15/15 0423  WBC 8.0 7.5  HGB 10.5* 9.6*  HCT 34.8* 32.3*  MCV 100.9* 100.6*  PLT 222 223    Cardiac Enzymes:  Recent Labs Lab 12/14/15 2325 12/15/15 0423 12/15/15 1027  TROPONINI <0.03 0.03* 0.03*    BNP: BNP (last 3  results)  Recent Labs  05/18/15 1045 12/05/15 1144 12/15/15 0423  BNP 298.3* 655.1* 758.8*    ProBNP (last 3 results) No results for input(s): PROBNP in the last 8760 hours.   CBG:  Recent Labs Lab 12/14/15 2203 12/15/15 0004 12/15/15 0533 12/15/15 0837 12/15/15 1128  GLUCAP 164* 223* 86 188* 165*    Coagulation Studies:  Recent Labs  12/14/15 1232 12/15/15 1027  LABPROT 27.5* 25.1*  INR 2.51 2.24    Other results: EKG: Dual AV paced rhythm 70 bpm  Imaging: Dg Chest 2 View  Result Date: 12/14/2015 CLINICAL DATA:  Not being able to sleep for 3 days due to tremors, shaking, thick tongued, altered mental status, fatigue, history hypertension, chronic combined systolic and diastolic CHF, ischemic cardiomyopathy, coronary artery disease EXAM: CHEST  2 VIEW COMPARISON:  12/05/2015 FINDINGS: LEFT subclavian transvenous pacemaker/AICD leads project at RIGHT atrium, RIGHT  ventricle and coronary sinus. Enlargement of cardiac silhouette post CABG. Pulmonary vascular congestion. Mediastinal contour stable. Slightly improved pulmonary edema since previous exam. No segmental consolidation, pleural effusion or pneumothorax. Bones demineralized. IMPRESSION: Enlargement cardiac silhouette post CABG and AICD with pulmonary vascular congestion and slightly improved pulmonary edema. Electronically Signed   By: Lavonia Dana M.D.   On: 12/14/2015 13:31   Dg Abd 1 View  Result Date: 12/14/2015 CLINICAL DATA:  Abdominal distension. EXAM: ABDOMEN - 1 VIEW COMPARISON:  Radiographs of March 19, 2013. FINDINGS: The bowel gas pattern is normal. No radio-opaque calculi or other significant radiographic abnormality are seen. IMPRESSION: No evidence of bowel obstruction or ileus. Electronically Signed   By: Marijo Conception, M.D.   On: 12/14/2015 21:51   Ct Head Wo Contrast  Result Date: 12/14/2015 CLINICAL DATA:  Acute encephalopathy. Worsening confusion, fatigue, recurrent tremulousness and jerking  movements. EXAM: CT HEAD WITHOUT CONTRAST TECHNIQUE: Contiguous axial images were obtained from the base of the skull through the vertex without intravenous contrast. COMPARISON:  None. FINDINGS: Brain: No intracranial hemorrhage, mass effect, or midline shift. No hydrocephalus. The basilar cisterns are patent. Mild to moderate chronic small vessel ischemia. No evidence of territorial infarct. No intracranial fluid collection. Vascular: No hyperdense vessel or abnormal calcification. Atherosclerosis of skullbase vasculature. Skull:  Calvarium is intact. Sinuses/Orbits: Included paranasal sinuses and mastoid air cells are well aerated. Other: None. IMPRESSION: Chronic small vessel ischemia without acute intracranial abnormality. Electronically Signed   By: Jeb Levering M.D.   On: 12/14/2015 21:28      Medications:     Current Medications: . amiodarone  200 mg Oral BID  . atorvastatin  20 mg Oral QHS  . carvedilol  50 mg Oral BID  . escitalopram  10 mg Oral Daily  . fenofibrate  54 mg Oral Daily  . gabapentin  150 mg Oral QHS  . insulin aspart  0-9 Units Subcutaneous Q4H  . insulin NPH Human  10 Units Subcutaneous QHS  . insulin NPH Human  20 Units Subcutaneous Q breakfast  . levothyroxine  25 mcg Oral QAC breakfast  . rOPINIRole  0.25 mg Oral QHS  . sodium chloride flush  3 mL Intravenous Q12H     Infusions:      Assessment   1. ARF on CKD stage III 2. Chronic combined CHF 3. PAF s/p DCCV 07/28/14 Chronic Afib 4. OHS/OSA 5. CAD s/p CABG 6. Carotid stenosis 7. Confusion  Plan    Despite thoracic impedence down on Corevue, still think that pt is dry. Pt does not appear volume overloaded on exam.  OK to hold torsemide today to rehydrate kidneys.  BNP mildly elevated. Can likely resume po diuretics in next 24-48 hours.   Husband states pt has been confused and has had trouble controlling sugars at home.  Glucose has been up to 207 here. No clear etiology for tremors.   Unlikely that either of these are from cardiac source.     We will continue to follow to assess and assist in managing volume status.   Thank you for the consult.    Length of Stay: 0  Shirley Friar PA-C 12/15/2015, 3:43 PM  Advanced Heart Failure Team Pager [phone] (M-F; 7a - 4p)  Please contact [facility] for night-coverage after hours (4p -7a ) and weekends on [email or web address]  Patient seen and examined with Oda Kilts, PA-C. We discussed all aspects of the encounter. I agree with the assessment and plan as stated  above.   ICD interrogated personally. No VT. Impedance down (suggestive of fluid overload).   However on exam looks dry (though exam difficult due to size) and renal function actually up some. Would hold diuretics today and follow renal function. Hopefully can restart oral diuretics in am. May need RHC at some point if symptoms worsen.   Husband reports confusion. Head CT shows small vessel disease but no acute process. She seems well oriented this am. Tremor may be related to amio.   Kaetlin Bullen,MD 7:10 PM

## 2015-12-15 NOTE — Evaluation (Signed)
Physical Therapy Evaluation Patient Details Name: Destiny [name] MRN: [id number] DOB: 1940-09-09 Today's Date: 12/15/2015   History of Present Illness  Pt is a 75 y/o F who presented with worsening confusion, fatigue, recurrent tremulousness, and jerking movements. Admitting dx: acute encephalopathy in setting over diuresis and polypharmacy.  She has had some episodes of hypoglycemia down to 40 at night.  Pt was seen in ED ~9 days prior to current admission for CHF exa cerbation and treated with Lasix.  Pt fell 2 weeks ago hitting the back of her head. Pt's PMH includes AICD, chronic back pain, CKD, gout, LBBB, morbid obesity, non-hodgkin's lymphoma, CABG x4.    Clinical Impression  Pt admitted with above diagnosis. Pt currently with functional limitations due to the deficits listed below (see PT Problem List). Pt lethargic today, limiting session, and reports that she hasn't gotten sleep in 4 days.  Pt currently requires light min assist for transfers due to fatigue.  PTA pt using rollator in home and electric scooter in community.  Her husband will be available to provide 24/7 assist/supervision at d/c. Pt will benefit from skilled PT to increase their independence and safety with mobility to allow discharge to the venue listed below.      Follow Up Recommendations Home health PT;Supervision for mobility/OOB    Equipment Recommendations  None recommended by PT    Recommendations for Other Services OT consult     Precautions / Restrictions Precautions Precautions: Fall;Other (comment) Precaution Comments: Monitor BP Restrictions Weight Bearing Restrictions: No      Mobility  Bed Mobility               General bed mobility comments: Pt sitting EOB upon PT arrival  Transfers Overall transfer level: Needs assistance Equipment used: Rolling walker (2 wheeled) Transfers: Sit to/from [facility] Sit to Stand: Min assist Stand pivot transfers: Min assist        General transfer comment: Light min assist to steady and to maneuver RW.  Pt stood ~5 minutes.    Ambulation/Gait             General Gait Details: Not safe to attempt at this time due to pt's lethargy  Stairs            Wheelchair Mobility    Modified Rankin (Stroke Patients Only)       Balance Overall balance assessment: Needs assistance;History of Falls Sitting-balance support: No upper extremity supported;Feet supported Sitting balance-Leahy Scale: Fair Sitting balance - Comments: Pt lethargic and falling asleep sitting EOB, requires close min guard assist   Standing balance support: Bilateral upper extremity supported;During functional activity Standing balance-Leahy Scale: Poor Standing balance comment: RW for support                             Pertinent Vitals/Pain Pain Assessment: No/denies pain    Home Living Family/patient expects to be discharged to:: Private residence Living Arrangements: Spouse/significant other Available Help at Discharge: Family;Available 24 hours/day Type of Home: House Home Access: Ramped entrance     Home Layout: One level Home Equipment: Walker - 4 wheels;Grab bars - tub/shower;Hand held shower head;Shower seat;Electric scooter;Walker - 2 wheels;Wheelchair - manual (3 wheeled rollator)      Prior Function Level of Independence: Independent with assistive device(s)         Comments: Ind ambulating household distances with 4 wheeled walker in home, scooter in community.  Dresses and showers Ind.  Hand Dominance        Extremity/Trunk Assessment   Upper Extremity Assessment: Defer to OT evaluation           Lower Extremity Assessment: Generalized weakness         Communication   Communication: No difficulties  Cognition Arousal/Alertness: Lethargic (husband says she hasn't gotten sleep in 4 days) Behavior During Therapy: WFL for tasks assessed/performed Overall Cognitive Status:  Within Functional Limits for tasks assessed                      General Comments General comments (skin integrity, edema, etc.): BP sitting EOB 115/67, standing at bedside 104/83.  Pt remains awake if someone talking to her or if active, otherwise falling asleep sitting EOB.    Exercises        Assessment/Plan    PT Assessment Patient needs continued PT services  PT Diagnosis Difficulty walking;Generalized weakness   PT Problem List Decreased strength;Decreased activity tolerance;Decreased balance;Decreased safety awareness;Cardiopulmonary status limiting activity;Obesity  PT Treatment Interventions DME instruction;Gait training;Functional mobility training;Therapeutic activities;Therapeutic exercise;Balance training;Patient/family education   PT Goals (Current goals can be found in the Care Plan section) Acute Rehab PT Goals Patient Stated Goal: to get some sleep PT Goal Formulation: With patient/family Time For Goal Achievement: 12/29/15 Potential to Achieve Goals: Good    Frequency Min 3X/week   Barriers to discharge        Co-evaluation               End of Session Equipment Utilized During Treatment: Gait belt;Oxygen Activity Tolerance: Patient limited by fatigue;Patient limited by lethargy Patient left: in chair;with call bell/phone within reach;with chair alarm set;with family/visitor present Nurse Communication: Mobility status    Functional Assessment Tool Used: Clinical Judgement Functional Limitation: Mobility: Walking and moving around Mobility: Walking and Moving Around Current Status [id number]): At least 1 percent but less than 20 percent impaired, limited or restricted Mobility: Walking and Moving Around Goal Status [phone]): At least 1 percent but less than 20 percent impaired, limited or restricted    Time: 1124-1150 PT Time Calculation (min) (ACUTE ONLY): 26 min   Charges:   PT Evaluation $PT Eval Moderate Complexity: 1 Procedure      PT G Codes:   PT G-Codes **NOT FOR INPATIENT CLASS** Functional Assessment Tool Used: Clinical Judgement Functional Limitation: Mobility: Walking and moving around Mobility: Walking and Moving Around Current Status [id number]): At least 1 percent but less than 20 percent impaired, limited or restricted Mobility: Walking and Moving Around Goal Status [phone]): At least 1 percent but less than 20 percent impaired, limited or restricted    Collie Siad PT, DPT  Pager: [phone] Phone: [phone] 12/15/2015, 12:06 PM

## 2015-12-15 NOTE — Care Management Note (Signed)
Case Management Note  Patient Details  Name: Destiny [name] MRN: [id number] Date of Birth: Apr 02, 1941  Subjective/Objective:     Admitted with Atrial Fib        Action/Plan: Patient lives at home with spouse; PCP Dr Gwyneth Revels; private insurance with Medicare/ [employer]; pharmacy of choice is [facility] [employer] [facility], pt receives a 3 month supply of medication. She has home oxygen with [employer], walker and cane; Also scales at home to weigh herself daily. CM will continue to follow for DCP  Expected Discharge Date:     Possibly 12/16/2015             Expected Discharge Plan:  Home/Self Care  In-House Referral:   [facility] CHF program  Discharge planning Services  CM Consult  Choice offered to:  Patient, Spouse   Status of Service:  In process, will continue to follow  Sherrilyn Rist [id number] 12/15/2015, 9:46 AM

## 2015-12-15 NOTE — Progress Notes (Signed)
OT Cancellation Note  Patient Details Name: KAT PLAZA MRN: [id number] DOB: Dec 28, 1940   Cancelled Treatment:    Reason Eval/Treat Not Completed: Fatigue/lethargy limiting ability to participate;Other (comment). Per pt's nurse she did not get much sleep last night and has been restless and "fidgety". Pt has been given Ativan. Pt and her spouse request OT return later. Will re attempt l;ater today as able/as appropriate  Britt Bottom 12/15/2015, 10:38 AM

## 2015-12-16 ENCOUNTER — Encounter (HOSPITAL_COMMUNITY): Payer: Self-pay | Admitting: *Deleted

## 2015-12-16 ENCOUNTER — Observation Stay (HOSPITAL_BASED_OUTPATIENT_CLINIC_OR_DEPARTMENT_OTHER): Payer: Medicare Other

## 2015-12-16 ENCOUNTER — Encounter (HOSPITAL_COMMUNITY)
Admission: EM | Disposition: A | Discharge status: Home / Self Care | Payer: Self-pay | Source: Home / Self Care | Attending: Emergency Medicine

## 2015-12-16 DIAGNOSIS — N179 Acute kidney failure, unspecified: Secondary | ICD-10-CM | POA: Diagnosis not present

## 2015-12-16 DIAGNOSIS — I5023 Acute on chronic systolic (congestive) heart failure: Secondary | ICD-10-CM | POA: Diagnosis not present

## 2015-12-16 DIAGNOSIS — I340 Nonrheumatic mitral (valve) insufficiency: Secondary | ICD-10-CM

## 2015-12-16 DIAGNOSIS — I481 Persistent atrial fibrillation: Secondary | ICD-10-CM | POA: Diagnosis not present

## 2015-12-16 DIAGNOSIS — R931 Abnormal findings on diagnostic imaging of heart and coronary circulation: Secondary | ICD-10-CM | POA: Diagnosis not present

## 2015-12-16 HISTORY — PX: TEE WITHOUT CARDIOVERSION: SHX5443

## 2015-12-16 LAB — COMPREHENSIVE METABOLIC PANEL
ALT: 17 U/L (ref 14–54)
ANION GAP: 7 (ref 5–15)
AST: 22 U/L (ref 15–41)
Albumin: 3.5 g/dL (ref 3.5–5.0)
Alkaline Phosphatase: 32 U/L — ABNORMAL LOW (ref 38–126)
BUN: 67 mg/dL — AB (ref 6–20)
CHLORIDE: 96 mmol/L — AB (ref 101–111)
CO2: 34 mmol/L — ABNORMAL HIGH (ref 22–32)
Calcium: 8.9 mg/dL (ref 8.9–10.3)
Creatinine, Ser: 3.16 mg/dL — ABNORMAL HIGH (ref 0.44–1.00)
GFR, EST AFRICAN AMERICAN: 16 mL/min — AB (ref >60)
GFR, EST NON AFRICAN AMERICAN: 14 mL/min — AB (ref >60)
Glucose, Bld: 88 mg/dL (ref 65–99)
POTASSIUM: 4.7 mmol/L (ref 3.5–5.1)
Sodium: 137 mmol/L (ref 135–145)
Total Bilirubin: 0.5 mg/dL (ref 0.3–1.2)
Total Protein: 6.5 g/dL (ref 6.5–8.1)

## 2015-12-16 LAB — GLUCOSE, CAPILLARY
GLUCOSE-CAPILLARY: 182 mg/dL — AB (ref 65–99)
GLUCOSE-CAPILLARY: 217 mg/dL — AB (ref 65–99)
Glucose-Capillary: 68 mg/dL (ref 65–99)
Glucose-Capillary: 103 mg/dL — ABNORMAL HIGH (ref 65–99)
Glucose-Capillary: 309 mg/dL — ABNORMAL HIGH (ref 65–99)
Glucose-Capillary: 166 mg/dL — ABNORMAL HIGH (ref 65–99)
Glucose-Capillary: 160 mg/dL — ABNORMAL HIGH (ref 65–99)

## 2015-12-16 LAB — HEMOGLOBIN A1C
HEMOGLOBIN A1C: 6.6 % — AB (ref 4.8–5.6)
Mean Plasma Glucose: 143 mg/dL

## 2015-12-16 LAB — CBC
HEMATOCRIT: 32 % — AB (ref 36.0–46.0)
HEMOGLOBIN: 9.6 g/dL — AB (ref 12.0–15.0)
MCH: 30.2 pg (ref 26.0–34.0)
MCHC: 30 g/dL (ref 30.0–36.0)
MCV: 100.6 fL — AB (ref 78.0–100.0)
Platelets: 202 10*3/uL (ref 150–400)
RBC: 3.18 MIL/uL — AB (ref 3.87–5.11)
RDW: 17.5 % — ABNORMAL HIGH (ref 11.5–15.5)
WBC: 6.4 10*3/uL (ref 4.0–10.5)

## 2015-12-16 LAB — PROTIME-INR
INR: 1.94
PROTHROMBIN TIME: 22.5 s — AB (ref 11.4–15.2)

## 2015-12-16 SURGERY — ECHOCARDIOGRAM, TRANSESOPHAGEAL
Anesthesia: Moderate Sedation

## 2015-12-16 MED ORDER — TORSEMIDE 20 MG PO TABS
40.0000 mg | ORAL_TABLET | Freq: Every day | ORAL | Status: DC
  Administered 2015-12-16 – 2015-12-17 (×2): 40 mg via ORAL
  Filled 2015-12-16 (×2): qty 2

## 2015-12-16 MED ORDER — WARFARIN SODIUM 5 MG PO TABS
5.0000 mg | ORAL_TABLET | Freq: Once | ORAL | Status: AC
  Administered 2015-12-16: 5 mg via ORAL
  Filled 2015-12-16: qty 1

## 2015-12-16 MED ORDER — HYDRALAZINE HCL 20 MG/ML IJ SOLN
INTRAMUSCULAR | Status: AC
  Filled 2015-12-16: qty 1

## 2015-12-16 MED ORDER — MIDAZOLAM HCL 10 MG/2ML IJ SOLN
INTRAMUSCULAR | Status: DC | PRN
  Administered 2015-12-16: 1 mg via INTRAVENOUS
  Administered 2015-12-16: 2 mg via INTRAVENOUS

## 2015-12-16 MED ORDER — SODIUM CHLORIDE 0.9 % IV SOLN
INTRAVENOUS | Status: DC
  Administered 2015-12-16: 500 mL via INTRAVENOUS

## 2015-12-16 MED ORDER — FENTANYL CITRATE (PF) 100 MCG/2ML IJ SOLN
INTRAMUSCULAR | Status: AC
  Filled 2015-12-16: qty 2

## 2015-12-16 MED ORDER — HYDRALAZINE HCL 20 MG/ML IJ SOLN
INTRAMUSCULAR | Status: DC | PRN
  Administered 2015-12-16: 10 mg via INTRAVENOUS

## 2015-12-16 MED ORDER — MIDAZOLAM HCL 5 MG/ML IJ SOLN
INTRAMUSCULAR | Status: AC
  Filled 2015-12-16: qty 2

## 2015-12-16 MED ORDER — FENTANYL CITRATE (PF) 100 MCG/2ML IJ SOLN
INTRAMUSCULAR | Status: DC | PRN
  Administered 2015-12-16: 25 ug via INTRAVENOUS

## 2015-12-16 MED ORDER — HYDRALAZINE HCL 20 MG/ML IJ SOLN
10.0000 mg | Freq: Once | INTRAMUSCULAR | Status: AC
  Administered 2015-12-16: 10 mg via INTRAVENOUS

## 2015-12-16 MED ORDER — WARFARIN - PHARMACIST DOSING INPATIENT
Freq: Every day | Status: DC
  Administered 2015-12-16: 18:00:00

## 2015-12-16 MED ORDER — BUTAMBEN-TETRACAINE-BENZOCAINE 2-2-14 % EX AERO
INHALATION_SPRAY | CUTANEOUS | Status: DC | PRN
  Administered 2015-12-16: 2 via TOPICAL

## 2015-12-16 NOTE — Progress Notes (Signed)
Advanced Heart Failure Rounding Note   Subjective:    Admitted with confusion.  Thought to be dry. Diuretics held. Creatinine coming down 3.4>3.1.     Objective:   Weight Range:  Vital Signs:   Temp:  [97.8 F (36.6 C)-99 F (37.2 C)] 97.8 F (36.6 C) (08/03 2221) Pulse Rate:  [69-72] 69 (08/03 2221) Resp:  [20] 20 (08/03 2221) BP: (100-122)/(48-66) 101/50 (08/03 2221) SpO2:  [90 %-95 %] 95 % (08/03 2221) Weight:  [272 lb 1.6 oz (123.4 kg)] 272 lb 1.6 oz (123.4 kg) (08/04 0324) Last BM Date: 12/15/15  Weight change: Filed Weights   12/14/15 2020 12/15/15 0541 12/16/15 0324  Weight: 272 lb 4.8 oz (123.5 kg) 271 lb 12.8 oz (123.3 kg) 272 lb 1.6 oz (123.4 kg)    Intake/Output:   Intake/Output Summary (Last 24 hours) at 12/16/15 0846 Last data filed at 12/16/15 0324  Gross per 24 hour  Intake              660 ml  Output              675 ml  Net              -15 ml     Physical Exam: General:  Well appearing. No resp difficulty. In the chair.  HEENT: normal Neck: supple. JVP difficult to assess due to body habitus . Carotids 2+ bilat; no bruits. No lymphadenopathy or thryomegaly appreciated. Cor: PMI nondisplaced. Regular rate & rhythm. No rubs, gallops or murmurs. Lungs: clear Abdomen:  Obese, soft, nontender, nondistended. No hepatosplenomegaly. No bruits or masses. Good bowel sounds. Extremities: no cyanosis, clubbing, rash, edema Neuro: alert & orientedx3, cranial nerves grossly intact. moves all 4 extremities w/o difficulty. Affect pleasant  Telemetry: A pced V sensed 70  Labs: Basic Metabolic Panel:  Recent Labs Lab 12/14/15 1232 12/14/15 2325 12/15/15 0423 12/16/15 0343  NA 135 133* 136 137  K 5.5* 5.3* 5.0 4.7  CL 93* 94* 96* 96*  CO2 34* 34* 35* 34*  GLUCOSE 113* 207* 97 88  BUN 57* 62* 62* 67*  CREATININE 3.38* 3.51* 3.45* 3.16*  CALCIUM 9.2 8.9 8.7* 8.9  MG  --   --  2.4  --   PHOS  --   --  4.1  --     Liver Function  Tests:  Recent Labs Lab 12/15/15 0423 12/16/15 0343  AST 24 22  ALT 17 17  ALKPHOS 33* 32*  BILITOT 0.4 0.5  PROT 6.4* 6.5  ALBUMIN 3.6 3.5   No results for input(s): LIPASE, AMYLASE in the last 168 hours. No results for input(s): AMMONIA in the last 168 hours.  CBC:  Recent Labs Lab 12/14/15 1232 12/15/15 0423 12/16/15 0343  WBC 8.0 7.5 6.4  HGB 10.5* 9.6* 9.6*  HCT 34.8* 32.3* 32.0*  MCV 100.9* 100.6* 100.6*  PLT 222 223 202    Cardiac Enzymes:  Recent Labs Lab 12/14/15 2325 12/15/15 0423 12/15/15 1027  TROPONINI <0.03 0.03* 0.03*    BNP: BNP (last 3 results)  Recent Labs  05/18/15 1045 12/05/15 1144 12/15/15 0423  BNP 298.3* 655.1* 758.8*    ProBNP (last 3 results) No results for input(s): PROBNP in the last 8760 hours.    Other results:  Imaging: Dg Chest 2 View  Result Date: 12/14/2015 CLINICAL DATA:  Not being able to sleep for 3 days due to tremors, shaking, thick tongued, altered mental status, fatigue, history hypertension, chronic combined systolic and  diastolic CHF, ischemic cardiomyopathy, coronary artery disease EXAM: CHEST  2 VIEW COMPARISON:  12/05/2015 FINDINGS: LEFT subclavian transvenous pacemaker/AICD leads project at RIGHT atrium, RIGHT ventricle and coronary sinus. Enlargement of cardiac silhouette post CABG. Pulmonary vascular congestion. Mediastinal contour stable. Slightly improved pulmonary edema since previous exam. No segmental consolidation, pleural effusion or pneumothorax. Bones demineralized. IMPRESSION: Enlargement cardiac silhouette post CABG and AICD with pulmonary vascular congestion and slightly improved pulmonary edema. Electronically Signed   By: Lavonia Dana M.D.   On: 12/14/2015 13:31   Dg Abd 1 View  Result Date: 12/14/2015 CLINICAL DATA:  Abdominal distension. EXAM: ABDOMEN - 1 VIEW COMPARISON:  Radiographs of March 19, 2013. FINDINGS: The bowel gas pattern is normal. No radio-opaque calculi or other significant  radiographic abnormality are seen. IMPRESSION: No evidence of bowel obstruction or ileus. Electronically Signed   By: Marijo Conception, M.D.   On: 12/14/2015 21:51   Ct Head Wo Contrast  Result Date: 12/14/2015 CLINICAL DATA:  Acute encephalopathy. Worsening confusion, fatigue, recurrent tremulousness and jerking movements. EXAM: CT HEAD WITHOUT CONTRAST TECHNIQUE: Contiguous axial images were obtained from the base of the skull through the vertex without intravenous contrast. COMPARISON:  None. FINDINGS: Brain: No intracranial hemorrhage, mass effect, or midline shift. No hydrocephalus. The basilar cisterns are patent. Mild to moderate chronic small vessel ischemia. No evidence of territorial infarct. No intracranial fluid collection. Vascular: No hyperdense vessel or abnormal calcification. Atherosclerosis of skullbase vasculature. Skull:  Calvarium is intact. Sinuses/Orbits: Included paranasal sinuses and mastoid air cells are well aerated. Other: None. IMPRESSION: Chronic small vessel ischemia without acute intracranial abnormality. Electronically Signed   By: Jeb Levering M.D.   On: 12/14/2015 21:28      Medications:     Scheduled Medications: . amiodarone  200 mg Oral BID  . atorvastatin  20 mg Oral QHS  . carvedilol  50 mg Oral BID  . escitalopram  10 mg Oral Daily  . fenofibrate  54 mg Oral Daily  . gabapentin  150 mg Oral QHS  . insulin aspart  0-9 Units Subcutaneous Q4H  . insulin NPH Human  10 Units Subcutaneous QHS  . insulin NPH Human  20 Units Subcutaneous Q breakfast  . levothyroxine  25 mcg Oral QAC breakfast  . rOPINIRole  0.25 mg Oral QHS  . sodium chloride flush  3 mL Intravenous Q12H     Infusions:     PRN Medications:  acetaminophen **OR** acetaminophen, albuterol, fluticasone, LORazepam, ondansetron **OR** ondansetron (ZOFRAN) IV, sodium chloride   Assessment:  1. ARF on CKD stage III 2. Chronic combined CHF 3. PAF s/p DCCV 07/28/14 Chronic Afib 4.  OHS/OSA 5. CAD s/p CABG 6. Carotid stenosis 7. Confusion   Plan/Discussion:   Creatinine coming down. 3.4>3.1. She will need outpatient nephrology follow up.   Restart torsemide 40 mg daily.   EF looks like 40-45%. Mobile area on mitral valve. Will need TEE to look for vegetation versus calcification. Order blood cultures    Length of Stay: 0   Amy Clegg NP-C  12/16/2015, 8:46 AM  Advanced Heart Failure Team Pager [phone] (M-F; 7a - 4p)  Please contact [facility] for night-coverage after hours (4p -7a ) and weekends on [email or web address]   Patient seen and examined with Darrick Grinder, NP. We discussed all aspects of the encounter. I agree with the assessment and plan as stated above.   Clinically much improved today. Looks euvolemic. I have reviewed echo images personally with Dr. Aundra Dubin. TTE incomplete  but LV function looks to be improved probably 40-45%. However RV looks down and there is a mobile density attached to the mitral valve which may be calcification vs vegetation. Will get bcx. Plan TEE later today (i discussed with her and her husband). Mental status much clearer. May have component of uremia. She follows with Dr. Marval Regal who has told her that she is not candidate for HD. Will resume po diuretics.   Raequon Catanzaro,MD 9:33 AM

## 2015-12-16 NOTE — Progress Notes (Signed)
RN called for pt returning from Endo with a BP of 204/90. Pt had left endo before this RN was able to speak with Destiny [name]. Pt received Hydralazine 10 mg IVP one time dose. BP 138/55 prior to transport to 3E04, upon my arrival pt was up to [facility], alert and oriented, denied any pain, did report she was hungry, no neuro deficits noted. BP 116/63, HR 73, 98% 2L. Advised RN to call for additional PRN orders and to repeat BP in 30 mins and call if BP begins to increase.

## 2015-12-16 NOTE — Progress Notes (Signed)
[place] Telephone report received from [place]. Mention that pt has high BP 190/92 . Hydralazine was given earrler but Bp remains to climb up to 204/90. Marland Kitchen Sonia Baller attempted to call Dr.Benshimon to no avail . Wants pt to be transferred here in instead. Placed acall to RRt to evaluate pt before transfer . Pt was given another dose of hydralazine. Bp better now  135/84 at 1605

## 2015-12-16 NOTE — Progress Notes (Signed)
ANTICOAGULATION CONSULT NOTE - Initial Consult  Pharmacy Consult for Warfarin Indication: atrial fibrillation  Allergies  Allergen Reactions  . Sulfonamide Derivatives Other (See Comments)    Unknown; "childhood allergy/mother"    Patient Measurements: Height: 5\' 4"  (162.6 cm) Weight: 272 lb 1.6 oz (123.4 kg) (a scale) IBW/kg (Calculated) : 54.7 Heparin Dosing Weight: n/a  Vital Signs:    Labs:  Recent Labs  12/14/15 1232 12/14/15 2325 12/15/15 0423 12/15/15 1027 12/16/15 0343 12/16/15 0553  HGB 10.5*  --  9.6*  --  9.6*  --   HCT 34.8*  --  32.3*  --  32.0*  --   PLT 222  --  223  --  202  --   LABPROT 27.5*  --   --  25.1*  --  22.5*  INR 2.51  --   --  2.24  --  1.94  CREATININE 3.38* 3.51* 3.45*  --  3.16*  --   TROPONINI  --  <0.03 0.03* 0.03*  --   --     Estimated Creatinine Clearance: 20.6 mL/min (by C-G formula based on SCr of 3.16 mg/dL).   Medical History: Past Medical History:  Diagnosis Date  . AICD (automatic cardioverter/defibrillator) present   . Anemia, unspecified 12/13/2013  . Back pain, chronic    "just when I walk; mass on 3rd and 4th vertebrae right lower back"  . Biventricular implantable cardiac defibrillator in situ 2007, 2012   a. 2007;  b. 2012 Gen change: SJM 3231-40 Uni BiV ICD, ser # [id number].  Marland Kitchen CAD (coronary artery disease)    a. 05/2001 CABG x4: LIMA->LAD, VG->D1, VG->D2, VG->RCA.  . Cardiomyopathy, ischemic    a. 2012 s/p SJM 3231-40 Uni BiV ICD, ser # [id number].  . Carotid stenosis    a. 10/19/2011 carotid duplex - Mild hard plaque bilaterally. Stable 40-59% bilateral ICA stenosis. Carotid US (04/2013):  Bilateral 40-59% ICA.  F/u 1 year  . Cerumen impaction 10/28/2015  . Chronic combined systolic and diastolic CHF (congestive heart failure) (HCC)    a. EF as low as 25% in 2006;  b. EF 60-65% in 12/2011;  c. 02/2013 Echo: EF 35-40, mod mid-dist antsept HK, Gr 2 DD, mild LVH.  . CKD (chronic kidney disease), stage III   . DM (diabetes  mellitus), type 2 with complications (HCC)    insulin dependent, retinopathy, neuropathy  . Dyslipidemia   . Gout ~ 08/2011  . Hypercalcemia 09/26/2013  . Hypertension   . Hypothyroidism   . Hypoxemia requiring supplemental oxygen   . LBBB (left bundle branch block)   . Morbid obesity with BMI of 45.0-49.9, adult (HCC)    Ht. 5'4". BMI 47.2  . Mural thrombus of left ventricle (HCC)    before 2003, while on Coumadin, No h/o CVA  . 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 Benay Spice, in remission  . On home oxygen therapy    "normally 2-3L; 24/7" (12/14/2015)  . OSA on CPAP   . PAF (paroxysmal atrial fibrillation) (HCC)    on Coumadin  . Presence of permanent cardiac pacemaker   . 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 Vitamin D deficiency 06/09/2012   historical    Medications:  Scheduled:  . amiodarone  200 mg Oral BID  . atorvastatin  20 mg Oral QHS  . carvedilol  50 mg Oral BID  . escitalopram  10 mg Oral Daily  .  fenofibrate  54 mg Oral Daily  . gabapentin  150 mg Oral QHS  . insulin aspart  0-9 Units Subcutaneous Q4H  . insulin NPH Human  10 Units Subcutaneous QHS  . insulin NPH Human  20 Units Subcutaneous Q breakfast  . levothyroxine  25 mcg Oral QAC breakfast  . rOPINIRole  0.25 mg Oral QHS  . sodium chloride flush  3 mL Intravenous Q12H  . torsemide  40 mg Oral Daily    Assessment: 75 yo female on chronic Coumadin for a.fib, hx mural thrombus.  Warfarin has been held since admission, pharmacy asked to resume today after discussion with Dr. Haroldine Laws.  Today's INR 1.94, no bleeding or complications noted.  PTA warfarin dose 2.5 mg daily except 5 mg on Sundays.  Goal of Therapy:  INR 2-3 Monitor platelets by anticoagulation protocol: Yes   Plan:  1. Warfarin 5 mg x 1 tonight. 2. Daily PT/INR.  Uvaldo Rising, BCPS  Clinical Pharmacist Pager [phone]  12/16/2015 10:50  AM

## 2015-12-16 NOTE — H&P (View-Only) (Signed)
Advanced Heart Failure Rounding Note   Subjective:    Admitted with confusion.  Thought to be dry. Diuretics held. Creatinine coming down 3.4>3.1.     Objective:   Weight Range:  Vital Signs:   Temp:  [97.8 F (36.6 C)-99 F (37.2 C)] 97.8 F (36.6 C) (08/03 2221) Pulse Rate:  [69-72] 69 (08/03 2221) Resp:  [20] 20 (08/03 2221) BP: (100-122)/(48-66) 101/50 (08/03 2221) SpO2:  [90 %-95 %] 95 % (08/03 2221) Weight:  [272 lb 1.6 oz (123.4 kg)] 272 lb 1.6 oz (123.4 kg) (08/04 0324) Last BM Date: 12/15/15  Weight change: Filed Weights   12/14/15 2020 12/15/15 0541 12/16/15 0324  Weight: 272 lb 4.8 oz (123.5 kg) 271 lb 12.8 oz (123.3 kg) 272 lb 1.6 oz (123.4 kg)    Intake/Output:   Intake/Output Summary (Last 24 hours) at 12/16/15 0846 Last data filed at 12/16/15 0324  Gross per 24 hour  Intake              660 ml  Output              675 ml  Net              -15 ml     Physical Exam: General:  Well appearing. No resp difficulty. In the chair.  HEENT: normal Neck: supple. JVP difficult to assess due to body habitus . Carotids 2+ bilat; no bruits. No lymphadenopathy or thryomegaly appreciated. Cor: PMI nondisplaced. Regular rate & rhythm. No rubs, gallops or murmurs. Lungs: clear Abdomen:  Obese, soft, nontender, nondistended. No hepatosplenomegaly. No bruits or masses. Good bowel sounds. Extremities: no cyanosis, clubbing, rash, edema Neuro: alert & orientedx3, cranial nerves grossly intact. moves all 4 extremities w/o difficulty. Affect pleasant  Telemetry: A pced V sensed 70  Labs: Basic Metabolic Panel:  Recent Labs Lab 12/14/15 1232 12/14/15 2325 12/15/15 0423 12/16/15 0343  NA 135 133* 136 137  K 5.5* 5.3* 5.0 4.7  CL 93* 94* 96* 96*  CO2 34* 34* 35* 34*  GLUCOSE 113* 207* 97 88  BUN 57* 62* 62* 67*  CREATININE 3.38* 3.51* 3.45* 3.16*  CALCIUM 9.2 8.9 8.7* 8.9  MG  --   --  2.4  --   PHOS  --   --  4.1  --     Liver Function  Tests:  Recent Labs Lab 12/15/15 0423 12/16/15 0343  AST 24 22  ALT 17 17  ALKPHOS 33* 32*  BILITOT 0.4 0.5  PROT 6.4* 6.5  ALBUMIN 3.6 3.5   No results for input(s): LIPASE, AMYLASE in the last 168 hours. No results for input(s): AMMONIA in the last 168 hours.  CBC:  Recent Labs Lab 12/14/15 1232 12/15/15 0423 12/16/15 0343  WBC 8.0 7.5 6.4  HGB 10.5* 9.6* 9.6*  HCT 34.8* 32.3* 32.0*  MCV 100.9* 100.6* 100.6*  PLT 222 223 202    Cardiac Enzymes:  Recent Labs Lab 12/14/15 2325 12/15/15 0423 12/15/15 1027  TROPONINI <0.03 0.03* 0.03*    BNP: BNP (last 3 results)  Recent Labs  05/18/15 1045 12/05/15 1144 12/15/15 0423  BNP 298.3* 655.1* 758.8*    ProBNP (last 3 results) No results for input(s): PROBNP in the last 8760 hours.    Other results:  Imaging: Dg Chest 2 View  Result Date: 12/14/2015 CLINICAL DATA:  Not being able to sleep for 3 days due to tremors, shaking, thick tongued, altered mental status, fatigue, history hypertension, chronic combined systolic and  diastolic CHF, ischemic cardiomyopathy, coronary artery disease EXAM: CHEST  2 VIEW COMPARISON:  12/05/2015 FINDINGS: LEFT subclavian transvenous pacemaker/AICD leads project at RIGHT atrium, RIGHT ventricle and coronary sinus. Enlargement of cardiac silhouette post CABG. Pulmonary vascular congestion. Mediastinal contour stable. Slightly improved pulmonary edema since previous exam. No segmental consolidation, pleural effusion or pneumothorax. Bones demineralized. IMPRESSION: Enlargement cardiac silhouette post CABG and AICD with pulmonary vascular congestion and slightly improved pulmonary edema. Electronically Signed   By: Lavonia Dana M.D.   On: 12/14/2015 13:31   Dg Abd 1 View  Result Date: 12/14/2015 CLINICAL DATA:  Abdominal distension. EXAM: ABDOMEN - 1 VIEW COMPARISON:  Radiographs of March 19, 2013. FINDINGS: The bowel gas pattern is normal. No radio-opaque calculi or other significant  radiographic abnormality are seen. IMPRESSION: No evidence of bowel obstruction or ileus. Electronically Signed   By: Marijo Conception, M.D.   On: 12/14/2015 21:51   Ct Head Wo Contrast  Result Date: 12/14/2015 CLINICAL DATA:  Acute encephalopathy. Worsening confusion, fatigue, recurrent tremulousness and jerking movements. EXAM: CT HEAD WITHOUT CONTRAST TECHNIQUE: Contiguous axial images were obtained from the base of the skull through the vertex without intravenous contrast. COMPARISON:  None. FINDINGS: Brain: No intracranial hemorrhage, mass effect, or midline shift. No hydrocephalus. The basilar cisterns are patent. Mild to moderate chronic small vessel ischemia. No evidence of territorial infarct. No intracranial fluid collection. Vascular: No hyperdense vessel or abnormal calcification. Atherosclerosis of skullbase vasculature. Skull:  Calvarium is intact. Sinuses/Orbits: Included paranasal sinuses and mastoid air cells are well aerated. Other: None. IMPRESSION: Chronic small vessel ischemia without acute intracranial abnormality. Electronically Signed   By: Jeb Levering M.D.   On: 12/14/2015 21:28      Medications:     Scheduled Medications: . amiodarone  200 mg Oral BID  . atorvastatin  20 mg Oral QHS  . carvedilol  50 mg Oral BID  . escitalopram  10 mg Oral Daily  . fenofibrate  54 mg Oral Daily  . gabapentin  150 mg Oral QHS  . insulin aspart  0-9 Units Subcutaneous Q4H  . insulin NPH Human  10 Units Subcutaneous QHS  . insulin NPH Human  20 Units Subcutaneous Q breakfast  . levothyroxine  25 mcg Oral QAC breakfast  . rOPINIRole  0.25 mg Oral QHS  . sodium chloride flush  3 mL Intravenous Q12H     Infusions:     PRN Medications:  acetaminophen **OR** acetaminophen, albuterol, fluticasone, LORazepam, ondansetron **OR** ondansetron (ZOFRAN) IV, sodium chloride   Assessment:  1. ARF on CKD stage III 2. Chronic combined CHF 3. PAF s/p DCCV 07/28/14 Chronic Afib 4.  OHS/OSA 5. CAD s/p CABG 6. Carotid stenosis 7. Confusion   Plan/Discussion:   Creatinine coming down. 3.4>3.1. She will need outpatient nephrology follow up.   Restart torsemide 40 mg daily.   EF looks like 40-45%. Mobile area on mitral valve. Will need TEE to look for vegetation versus calcification. Order blood cultures    Length of Stay: 0   Amy Clegg NP-C  12/16/2015, 8:46 AM  Advanced Heart Failure Team Pager [phone] (M-F; 7a - 4p)  Please contact [facility] for night-coverage after hours (4p -7a ) and weekends on [email or web address]   Patient seen and examined with Darrick Grinder, NP. We discussed all aspects of the encounter. I agree with the assessment and plan as stated above.   Clinically much improved today. Looks euvolemic. I have reviewed echo images personally with Dr. Aundra Dubin. TTE incomplete  but LV function looks to be improved probably 40-45%. However RV looks down and there is a mobile density attached to the mitral valve which may be calcification vs vegetation. Will get bcx. Plan TEE later today (i discussed with her and her husband). Mental status much clearer. May have component of uremia. She follows with Dr. Marval Regal who has told her that she is not candidate for HD. Will resume po diuretics.   Karstyn Birkey,MD 9:33 AM

## 2015-12-16 NOTE — Progress Notes (Addendum)
Hydralazine 10 mg given IV per Dr. Haroldine Laws order, repeat BP 135/88, patient to transfer back to 3E04, Elfredia Nevins, RN notified.

## 2015-12-16 NOTE — CV Procedure (Signed)
    TRANSESOPHAGEAL ECHOCARDIOGRAM   NAME:  Destiny [name]   MRN: [id number] DOB:  07/21/1940   ADMIT DATE: 12/14/2015  INDICATIONS:   PROCEDURE:   Informed consent was obtained prior to the procedure. The risks, benefits and alternatives for the procedure were discussed and the patient comprehended these risks.  Risks include, but are not limited to, cough, sore throat, vomiting, nausea, somnolence, esophageal and stomach trauma or perforation, bleeding, low blood pressure, aspiration, pneumonia, infection, trauma to the teeth and death.    During this procedure the patient is administered a total of Versed 3 mg and Fentanyl 25 mcg to achieve and maintain moderate conscious sedation.  The patient's heart rate, blood pressure, and oxygen saturation are monitored continuously during the procedure. The period of conscious sedation is 19 minutes, of which I was present face-to-face 100% of this time.  The oropharynx was anesthetized with cetacaine spray.  The transesophageal probe was inserted in the esophagus and stomach without difficulty and multiple views were obtained.     COMPLICATIONS:    There were no immediate complications.  FINDINGS:  LEFT VENTRICLE: EF = 40%. Global HK  RIGHT VENTRICLE: Moderately HK  LEFT ATRIUM: Dilated  LEFT ATRIAL APPENDAGE: Not visualized  RIGHT ATRIUM: Dilated  AORTIC VALVE:  Trileaflet. Mildly calcified. Left coronary cusp fused. No signifcant AS/AI  MITRAL VALVE:    Valve thickened. Mild MS. Mean gradient 19mm HG. MVA 1.73cm2. Ruptured secondary on anterior leaflet of MV with mild prolapse. Mild MR.  TRICUSPID VALVE: Normal. Moderate TR  PULMONIC VALVE: Grossly normal. No PI  INTERATRIAL SEPTUM: No PFO or ASD.  PERICARDIUM: No effusion  DESCENDING AORTA:Mild plaque   Bensimhon, Daniel,MD 3:11 PM

## 2015-12-16 NOTE — Progress Notes (Signed)
Echocardiogram Echocardiogram Transesophageal has been performed.  Destiny [name] 12/16/2015, 3:25 PM

## 2015-12-16 NOTE — Progress Notes (Signed)
1625 pt transferred to 3e and commode previledge rendered .had abm the patient comfortable . Fully awake , alert and denies pain. Cm AV paced. Reevaluated by RRT and vs  Taken and charted. Will be continued to [employer]

## 2015-12-16 NOTE — Interval H&P Note (Signed)
History and Physical Interval Note:  12/16/2015 2:56 PM  Destiny [name]  has presented today for surgery, with the diagnosis of vegitation  The various methods of treatment have been discussed with the patient and family. After consideration of risks, benefits and other options for treatment, the patient has consented to  Procedure(s): TRANSESOPHAGEAL ECHOCARDIOGRAM (TEE) (N/A) as a surgical intervention .  The patient's history has been reviewed, patient examined, no change in status, stable for surgery.  I have reviewed the patient's chart and labs.  Questions were answered to the patient's satisfaction.     Bensimhon, Quillian Quince

## 2015-12-16 NOTE — Progress Notes (Signed)
Call placed to Dr. Haroldine Laws to report BP 204/90, verbal order received for hydralazine 10mg  IV x1 now.

## 2015-12-16 NOTE — Progress Notes (Signed)
Pt slept on and off overnight on her CPAP machine, husband is in bed side overnight, this am around 3.30 blood sugar was down to 68, after some snacks came up to 103, no any other sign of SOB and distress, will continue to monitor the patient

## 2015-12-16 NOTE — Progress Notes (Signed)
1816 sitting at the edge of the bed. Dangling feet

## 2015-12-16 NOTE — Consult Note (Signed)
   [facility] CM Inpatient Consult   12/16/2015  SYNIYAH BOURNE May 14, 1941 [id number]  Patient screened for potential [place] Management services. Patient is eligible for [facility] Care Management services under patient's Medicare  plan. Patient's primary care provider is Dr. Penni Homans.  Patient's medical record reveals in MD notes  she is a 75 y/o F who presented with worsening confusion, fatigue, recurrent tremulousness, and jerking movements. Admitting dx: acute encephalopathy in setting over diuresis and polypharmacy. She has had some episodes of hypoglycemia down to 40 at night. Pt was seen in ED ~9 days prior to current admission for HF exa cerbation and treated with Lasix. Pt fell 2 weeks ago hitting the back of her head. Pt's PMH includes AICD, chronic back pain, CKD, gout, LBBB, morbid obesity, non-hodgkin's lymphoma, CABG x4.   Met with patient and husband briefly regarding [place] [facility] and community needs.  Patient and husband declined services stating they didn't need anything extra at this time.  Husband state patient has follow up with lots of physicians and he was not sure she would need physical therapy when she got home but no care management needs at this time.  Chart review reveals that inpatient RNCM has assigned  [facility] HF calls.  A brochure with contact information was given and encouraged to call if needs arises.  For questions contact:   Natividad Brood, RN BSN [facility] Liaison  [phone] business mobile phone Toll free office [phone]

## 2015-12-16 NOTE — Progress Notes (Addendum)
[facility] PROGRESS NOTE  Destiny [name] [id number] DOB: 1941-04-03 DOA: 12/14/2015   PCP: Penni Homans, MD     Assessment/Plan: Active Problems:   Diabetes mellitus type 2 with retinopathy ([place])   Essential hypertension   ATRIAL FIBRILLATION, PAROXYSMAL   Automatic implantable cardioverter-defibrillator in situ   OSA (obstructive sleep apnea)   Anemia   Type 2 diabetes mellitus, controlled, with renal complications (HCC)   Chronic combined systolic and diastolic CHF (congestive heart failure) (HCC)   Debility   Acute encephalopathy   Abdominal distention   AKI (acute kidney injury) ([place])   75 y/o F who presented with worsening confusion, fatigue, recurrent tremulousness, and jerking movements. Admitting dx: acute encephalopathy in setting over diuresis and polypharmacy.  She has had some episodes of hypoglycemia down to 40 at night.  Pt was seen in ED ~9 days prior to current admission for CHF exa cerbation and treated with Lasix.  Pt fell 2 weeks ago hitting the back of her head. Pt's PMH includes AICD, chronic back pain, CKD, gout, LBBB, morbid obesity, non-hodgkin's lymphoma, CABG x4  Assessment and plan  Acute encephalopathy - in the setting of over diuresis and polypharmacy VBG showed compensated hypercarbia currently appears to be alert and oriented. Given recent head trauma will obtain CT of the head. Patient status post Darcella Cheshire not a candidate for MRI, I suspect that she has side effects from regular meds due to change in her renal clearance , CT head negative , fortunately she is better today and slept well last night , she has been taking 600 mg BID of gabapentin, she received only 150 mg QHS yesterday  . ATRIAL FIBRILLATION, PAROXYSMAL - CHA2DS2 vasc score - 6 on chronic anticoagulation , would Defer to cardiology given patient's debility and risk of fall if long term anticoagulation continuation  Is warranted, TEE today  showed  Ruptured secondary on anterior  leaflet of MV with mild prolapse. Mild MR  . Anemia chronic likely anemia of chronic disease currently at baseline, hg dropped 10.5>9.6  . Automatic implantable cardioverter-defibrillator in situ - stable continue to monitor not an MRI candidate  . Chronic combined systolic and diastolic CHF (congestive heart failure) ([place]) - possibly have being over diuresed. Patient still has abdominal distention but no peripheral swelling. Creatinine better than yesterday 3.4>3.16. Patient has been more fatigued. Cards to dose   Torsemide .Crds restarted torsemide today. Cardiology to see Dr Aundra Dubin , ECHO with limited windows , TEE showed EF of 40%   . Diabetes mellitus type 2 with retinopathy ([place]) - controlled , cont NPH and SSI  . Essential hypertension - blood pressure currently improved on  Coreg, demadex  . OSA (obstructive sleep apnea) - CPAP QHS   constipation -  KUB   was negative for impaction  restless legs ,insomnia - I suspect RLS , started low dose requip which seemed to have helped and lowered dose of gabapentin ,  DVT prophylaxsis   Code Status:  Full code    Family Communication: Discussed in detail with the patient's husband , all imaging results, lab results explained to the patient   Disposition Plan:  As per cardiology       Consultants:  cardiology  Procedures:  None   Antibiotics: Anti-infectives    None         HPI/Subjective: Slept very well last night   Objective: Vitals:   12/16/15 1535 12/16/15 1545 12/16/15 1555 12/16/15 1600  BP: Marland Kitchen)  190/92 (!) 204/90 (!) 138/55 116/63  Pulse: 70 69    Resp: 19 17    Temp:      TempSrc:      SpO2: 97% 97%  98%  Weight:      Height:        Intake/Output Summary (Last 24 hours) at 12/16/15 1642 Last data filed at 12/16/15 1633  Gross per 24 hour  Intake              270 ml  Output             1502 ml  Net            -1232 ml    Exam:  Examination:  General exam: Appears calm and comfortable   Respiratory system: Clear to auscultation. Respiratory effort normal. Cardiovascular system: S1 & S2 heard, RRR. No JVD, murmurs, rubs, gallops or clicks. No pedal edema. Gastrointestinal system: Abdomen is nondistended, soft and nontender. No organomegaly or masses felt. Normal bowel sounds heard. Central nervous system: Alert and oriented. No focal neurological deficits. Extremities: Symmetric 5 x 5 power. Skin: No rashes, lesions or ulcers Psychiatry: Judgement and insight appear normal. Mood & affect appropriate.     Data Reviewed: I have personally reviewed following labs and imaging studies  Micro Results Recent Results (from the past 240 hour(s))  Culture, blood (routine x 2)     Status: None (Preliminary result)   Collection Time: 12/16/15 10:20 AM  Result Value Ref Range Status   Specimen Description BLOOD RIGHT HAND  Final   Special Requests BOTTLES DRAWN AEROBIC ONLY  5CC  Final   Culture PENDING  Incomplete   Report Status PENDING  Incomplete    Radiology Reports Dg Chest 2 View  Result Date: 12/14/2015 CLINICAL DATA:  Not being able to sleep for 3 days due to tremors, shaking, thick tongued, altered mental status, fatigue, history hypertension, chronic combined systolic and diastolic CHF, ischemic cardiomyopathy, coronary artery disease EXAM: CHEST  2 VIEW COMPARISON:  12/05/2015 FINDINGS: LEFT subclavian transvenous pacemaker/AICD leads project at RIGHT atrium, RIGHT ventricle and coronary sinus. Enlargement of cardiac silhouette post CABG. Pulmonary vascular congestion. Mediastinal contour stable. Slightly improved pulmonary edema since previous exam. No segmental consolidation, pleural effusion or pneumothorax. Bones demineralized. IMPRESSION: Enlargement cardiac silhouette post CABG and AICD with pulmonary vascular congestion and slightly improved pulmonary edema. Electronically Signed   By: Lavonia Dana M.D.   On: 12/14/2015 13:31   Dg Chest 2 View  Result Date:  12/05/2015 CLINICAL DATA:  Decreased oxygen saturation. EXAM: CHEST  2 VIEW COMPARISON:  03/11/2015 FINDINGS: Stable dual lead cardiac pacemaker. Cardiomediastinal silhouette is stably enlarged. Mediastinal contours appear intact. Bilateral main pulmonary arteries are prominent. There is no evidence of pneumothorax. There is coarsening of the interstitial markings usually seen with interstitial pulmonary edema. Osseous structures are without acute abnormality. Soft tissues are grossly normal. IMPRESSION: Findings suggestive of congestive heart failure with stable cardiomegaly and interval development of interstitial pulmonary edema. Probable pulmonary arterial hypertension. Electronically Signed   By: Fidela Salisbury M.D.   On: 12/05/2015 13:02  Dg Abd 1 View  Result Date: 12/14/2015 CLINICAL DATA:  Abdominal distension. EXAM: ABDOMEN - 1 VIEW COMPARISON:  Radiographs of March 19, 2013. FINDINGS: The bowel gas pattern is normal. No radio-opaque calculi or other significant radiographic abnormality are seen. IMPRESSION: No evidence of bowel obstruction or ileus. Electronically Signed   By: Marijo Conception, M.D.   On: 12/14/2015 21:51  Ct Head Wo Contrast  Result Date: 12/14/2015 CLINICAL DATA:  Acute encephalopathy. Worsening confusion, fatigue, recurrent tremulousness and jerking movements. EXAM: CT HEAD WITHOUT CONTRAST TECHNIQUE: Contiguous axial images were obtained from the base of the skull through the vertex without intravenous contrast. COMPARISON:  None. FINDINGS: Brain: No intracranial hemorrhage, mass effect, or midline shift. No hydrocephalus. The basilar cisterns are patent. Mild to moderate chronic small vessel ischemia. No evidence of territorial infarct. No intracranial fluid collection. Vascular: No hyperdense vessel or abnormal calcification. Atherosclerosis of skullbase vasculature. Skull:  Calvarium is intact. Sinuses/Orbits: Included paranasal sinuses and mastoid air cells are well  aerated. Other: None. IMPRESSION: Chronic small vessel ischemia without acute intracranial abnormality. Electronically Signed   By: Jeb Levering M.D.   On: 12/14/2015 21:28     CBC  Recent Labs Lab 12/14/15 1232 12/15/15 0423 12/16/15 0343  WBC 8.0 7.5 6.4  HGB 10.5* 9.6* 9.6*  HCT 34.8* 32.3* 32.0*  PLT 222 223 202  MCV 100.9* 100.6* 100.6*  MCH 30.4 29.9 30.2  MCHC 30.2 29.7* 30.0  RDW 17.7* 17.7* 17.5*    Chemistries   Recent Labs Lab 12/14/15 1232 12/14/15 2325 12/15/15 0423 12/16/15 0343  NA 135 133* 136 137  K 5.5* 5.3* 5.0 4.7  CL 93* 94* 96* 96*  CO2 34* 34* 35* 34*  GLUCOSE 113* 207* 97 88  BUN 57* 62* 62* 67*  CREATININE 3.38* 3.51* 3.45* 3.16*  CALCIUM 9.2 8.9 8.7* 8.9  MG  --   --  2.4  --   AST  --   --  24 22  ALT  --   --  17 17  ALKPHOS  --   --  33* 32*  BILITOT  --   --  0.4 0.5   ------------------------------------------------------------------------------------------------------------------ estimated creatinine clearance is 20.6 mL/min (by C-G formula based on SCr of 3.16 mg/dL). ------------------------------------------------------------------------------------------------------------------  Recent Labs  12/14/15 2325  HGBA1C 6.6*   ------------------------------------------------------------------------------------------------------------------ No results for input(s): CHOL, HDL, LDLCALC, TRIG, CHOLHDL, LDLDIRECT in the last 72 hours. ------------------------------------------------------------------------------------------------------------------  Recent Labs  12/15/15 0423  TSH 4.270   ------------------------------------------------------------------------------------------------------------------ No results for input(s): VITAMINB12, FOLATE, FERRITIN, TIBC, IRON, RETICCTPCT in the last 72 hours.  Coagulation profile  Recent Labs Lab 12/14/15 1232 12/15/15 1027 12/16/15 0553  INR 2.51 2.24 1.94    No results for  input(s): DDIMER in the last 72 hours.  Cardiac Enzymes  Recent Labs Lab 12/14/15 2325 12/15/15 0423 12/15/15 1027  TROPONINI <0.03 0.03* 0.03*   ------------------------------------------------------------------------------------------------------------------ Invalid input(s): POCBNP   CBG:  Recent Labs Lab 12/15/15 2330 12/16/15 0322 12/16/15 0355 12/16/15 1148 12/16/15 1528  GLUCAP 93 68 103* 309* 166*       Studies: Dg Abd 1 View  Result Date: 12/14/2015 CLINICAL DATA:  Abdominal distension. EXAM: ABDOMEN - 1 VIEW COMPARISON:  Radiographs of March 19, 2013. FINDINGS: The bowel gas pattern is normal. No radio-opaque calculi or other significant radiographic abnormality are seen. IMPRESSION: No evidence of bowel obstruction or ileus. Electronically Signed   By: Marijo Conception, M.D.   On: 12/14/2015 21:51   Ct Head Wo Contrast  Result Date: 12/14/2015 CLINICAL DATA:  Acute encephalopathy. Worsening confusion, fatigue, recurrent tremulousness and jerking movements. EXAM: CT HEAD WITHOUT CONTRAST TECHNIQUE: Contiguous axial images were obtained from the base of the skull through the vertex without intravenous contrast. COMPARISON:  None. FINDINGS: Brain: No intracranial hemorrhage, mass effect, or midline shift. No hydrocephalus. The basilar cisterns are patent. Mild to moderate  chronic small vessel ischemia. No evidence of territorial infarct. No intracranial fluid collection. Vascular: No hyperdense vessel or abnormal calcification. Atherosclerosis of skullbase vasculature. Skull:  Calvarium is intact. Sinuses/Orbits: Included paranasal sinuses and mastoid air cells are well aerated. Other: None. IMPRESSION: Chronic small vessel ischemia without acute intracranial abnormality. Electronically Signed   By: Jeb Levering M.D.   On: 12/14/2015 21:28      Lab Results  Component Value Date   HGBA1C 6.6 (H) 12/14/2015   HGBA1C 7.2 (H) 09/26/2014   HGBA1C 7.3 (H) 03/06/2013    Lab Results  Component Value Date   MICROALBUR 77.0 (H) 03/28/2011   LDLCALC 68 07/14/2015   CREATININE 3.16 (H) 12/16/2015       Scheduled Meds: . amiodarone  200 mg Oral BID  . atorvastatin  20 mg Oral QHS  . carvedilol  50 mg Oral BID  . escitalopram  10 mg Oral Daily  . fenofibrate  54 mg Oral Daily  . gabapentin  150 mg Oral QHS  . insulin aspart  0-9 Units Subcutaneous Q4H  . insulin NPH Human  10 Units Subcutaneous QHS  . insulin NPH Human  20 Units Subcutaneous Q breakfast  . levothyroxine  25 mcg Oral QAC breakfast  . rOPINIRole  0.25 mg Oral QHS  . sodium chloride flush  3 mL Intravenous Q12H  . torsemide  40 mg Oral Daily  . warfarin  5 mg Oral ONCE-1800  . Warfarin - Pharmacist Dosing Inpatient   Does not apply q1800   Continuous Infusions:    LOS: 0 days    Time spent: >30 MINS    [facility]  [facility] Pager [phone]. If 7PM-7AM, please contact night-coverage at [email or web address], password [facility] 12/16/2015, 4:42 PM  LOS: 0 days

## 2015-12-17 DIAGNOSIS — G9340 Encephalopathy, unspecified: Secondary | ICD-10-CM | POA: Diagnosis not present

## 2015-12-17 DIAGNOSIS — I5042 Chronic combined systolic (congestive) and diastolic (congestive) heart failure: Secondary | ICD-10-CM | POA: Diagnosis not present

## 2015-12-17 DIAGNOSIS — N179 Acute kidney failure, unspecified: Secondary | ICD-10-CM | POA: Diagnosis not present

## 2015-12-17 DIAGNOSIS — D500 Iron deficiency anemia secondary to blood loss (chronic): Secondary | ICD-10-CM

## 2015-12-17 LAB — COMPREHENSIVE METABOLIC PANEL
ALBUMIN: 3.4 g/dL — AB (ref 3.5–5.0)
ALK PHOS: 31 U/L — AB (ref 38–126)
ALT: 14 U/L (ref 14–54)
ANION GAP: 7 (ref 5–15)
AST: 21 U/L (ref 15–41)
BILIRUBIN TOTAL: 0.5 mg/dL (ref 0.3–1.2)
BUN: 64 mg/dL — AB (ref 6–20)
CALCIUM: 9 mg/dL (ref 8.9–10.3)
CO2: 34 mmol/L — ABNORMAL HIGH (ref 22–32)
Chloride: 98 mmol/L — ABNORMAL LOW (ref 101–111)
Creatinine, Ser: 2.85 mg/dL — ABNORMAL HIGH (ref 0.44–1.00)
GFR calc Af Amer: 18 mL/min — ABNORMAL LOW (ref >60)
GFR, EST NON AFRICAN AMERICAN: 15 mL/min — AB (ref >60)
GLUCOSE: 142 mg/dL — AB (ref 65–99)
POTASSIUM: 5 mmol/L (ref 3.5–5.1)
Sodium: 139 mmol/L (ref 135–145)
TOTAL PROTEIN: 5.8 g/dL — AB (ref 6.5–8.1)

## 2015-12-17 LAB — GLUCOSE, CAPILLARY
GLUCOSE-CAPILLARY: 144 mg/dL — AB (ref 65–99)
GLUCOSE-CAPILLARY: 142 mg/dL — AB (ref 65–99)
GLUCOSE-CAPILLARY: 176 mg/dL — AB (ref 65–99)

## 2015-12-17 LAB — CBC
HEMATOCRIT: 33.1 % — AB (ref 36.0–46.0)
HEMOGLOBIN: 10 g/dL — AB (ref 12.0–15.0)
MCH: 30.5 pg (ref 26.0–34.0)
MCHC: 30.2 g/dL (ref 30.0–36.0)
MCV: 100.9 fL — AB (ref 78.0–100.0)
Platelets: 209 10*3/uL (ref 150–400)
RBC: 3.28 MIL/uL — ABNORMAL LOW (ref 3.87–5.11)
RDW: 17.4 % — AB (ref 11.5–15.5)
WBC: 8.2 10*3/uL (ref 4.0–10.5)

## 2015-12-17 LAB — PROTIME-INR
INR: 1.96
Prothrombin Time: 22.6 seconds — ABNORMAL HIGH (ref 11.4–15.2)

## 2015-12-17 MED ORDER — TORSEMIDE 20 MG PO TABS: 40.0000 mg | ORAL_TABLET | Freq: Every day | ORAL | 2 refills | Status: DC

## 2015-12-17 MED ORDER — ROPINIROLE HCL 0.25 MG PO TABS: 0.2500 mg | ORAL_TABLET | Freq: Every day | ORAL | 1 refills | Status: DC

## 2015-12-17 MED ORDER — GABAPENTIN 300 MG PO CAPS: 600.0000 mg | ORAL_CAPSULE | Freq: Every day | ORAL | 0 refills | Status: DC

## 2015-12-17 MED ORDER — WARFARIN SODIUM 5 MG PO TABS: 5.0000 mg | ORAL_TABLET | Freq: Once | ORAL | Status: DC

## 2015-12-17 MED ORDER — GABAPENTIN 300 MG PO CAPS: 300.0000 mg | ORAL_CAPSULE | Freq: Every day | ORAL | 0 refills | Status: DC

## 2015-12-17 NOTE — Discharge Summary (Signed)
Physician Discharge Summary  JERRIS KELTZ MRN: [id number] DOB/AGE: 1941-02-08 75 y.o.  PCP: Penni Homans, MD   Admit date: 12/14/2015 Discharge date: 12/17/2015  Discharge Diagnoses:   Active Problems:   Diabetes mellitus type 2 with retinopathy ([place])   Essential hypertension   ATRIAL FIBRILLATION, PAROXYSMAL   Automatic implantable cardioverter-defibrillator in situ   OSA (obstructive sleep apnea)   Anemia   Type 2 diabetes mellitus, controlled, with renal complications (HCC)   Chronic combined systolic and diastolic CHF (congestive heart failure) (HCC)   Debility   Acute encephalopathy   Abdominal distention   AKI (acute kidney injury) ([place])    Follow-up recommendations Follow-up with PCP in 3-5 days , including all  additional recommended appointments as below Follow-up CBC, CMP in 3-5 days Patient needs to check on 8/8 or 8/9 We have reduced torsemide to '40mg'$  daily. Can take extra 20 mg in afternoon as needed for weight gain     Current Discharge Medication List    START taking these medications   Details  rOPINIRole (REQUIP) 0.25 MG tablet Take 1 tablet (0.25 mg total) by mouth at bedtime. Qty: 30 tablet, Refills: 1      CONTINUE these medications which have CHANGED   Details  gabapentin (NEURONTIN) 300 MG capsule Take 2 capsule (600 mg total) by mouth at bedtime. Qty: 30 capsule, Refills: 0    torsemide (DEMADEX) 20 MG tablet Take 2 tablets (40 mg total) by mouth daily. Qty: 30 tablet, Refills: 2      CONTINUE these medications which have NOT CHANGED   Details  acetaminophen (TYLENOL) 500 MG tablet Take 1,000 mg by mouth 2 (two) times daily as needed for mild pain.     albuterol (PROVENTIL HFA;VENTOLIN HFA) 108 (90 BASE) MCG/ACT inhaler Inhale 2 puffs into the lungs every 6 (six) hours as needed for wheezing or shortness of breath. Qty: 1 Inhaler, Refills: 1   Associated Diagnoses: Unspecified asthma(493.90)    amiodarone (PACERONE) 200 MG tablet TAKE  1 TABLET TWICE DAILY Qty: 180 tablet, Refills: 0    atorvastatin (LIPITOR) 20 MG tablet TAKE 1 TABLET EVERY DAY Qty: 90 tablet, Refills: 1    carvedilol (COREG) 25 MG tablet TAKE 2 TABLETS TWICE DAILY Qty: 360 tablet, Refills: 3    chlorpheniramine (CHLOR-TRIMETON) 4 MG tablet Take 4 mg by mouth 2 (two) times daily.    escitalopram (LEXAPRO) 10 MG tablet TAKE 1 TABLET EVERY DAY Qty: 90 tablet, Refills: 1    fenofibrate (TRICOR) 48 MG tablet TAKE 1 TABLET EVERY DAY Qty: 90 tablet, Refills: 3    fluticasone (FLONASE) 50 MCG/ACT nasal spray Place 2 sprays into both nostrils daily as needed for allergies or rhinitis. As needed for nasal stuffiness. Qty: 16 g, Refills: 0    glucose blood (FREESTYLE LITE) test strip DX: 250.60  Check sugars bid and as needed Qty: 100 each, Refills: 2   Associated Diagnoses: Diabetes ([place])    insulin NPH (HUMULIN N,NOVOLIN N) 100 UNIT/ML injection Inject 15-30 Units into the skin 2 (two) times daily before a meal. Take 20-30 units in the morning and 10-15 units in the evening    insulin regular (NOVOLIN R,HUMULIN R) 100 units/mL injection Change to 15 units three times a with meals (to cover all meals--this is the same total amount but spread out) Qty: 10 mL, Refills: 12    levothyroxine (SYNTHROID, LEVOTHROID) 25 MCG tablet TAKE 1 TABLET EVERY DAY BEFORE BREAKFAST Qty: 90 tablet, Refills: 1  LORazepam (ATIVAN) 0.5 MG tablet Take 1 tablet (0.5 mg total) by mouth at bedtime. Qty: 90 tablet, Refills: 1    OXYGEN Inhale 3 L into the lungs continuous.    Vitamin D, Ergocalciferol, (DRISDOL) 50000 UNITS CAPS capsule Take 50,000 Units by mouth every Tuesday.     warfarin (COUMADIN) 5 MG tablet TAKE AS DIRECTED BY ANTICOAGULATION CLINIC. Qty: 70 tablet, Refills: 1         Discharge Condition: Overall prognosis guarded   Discharge Instructions Get Medicines reviewed and adjusted: Please take all your medications with you for your next visit  with your Primary MD  Please request your Primary MD to go over all hospital tests and procedure/radiological results at the follow up, please ask your Primary MD to get all Hospital records sent to his/her office.  If you experience worsening of your admission symptoms, develop shortness of breath, life threatening emergency, suicidal or homicidal thoughts you must seek medical attention immediately by calling 911 or calling your MD immediately if symptoms less severe.  You must read complete instructions/literature along with all the possible adverse reactions/side effects for all the Medicines you take and that have been prescribed to you. Take any new Medicines after you have completely understood and accpet all the possible adverse reactions/side effects.   Do not drive when taking Pain medications.   Do not take more than prescribed Pain, Sleep and Anxiety Medications  Special Instructions: If you have smoked or chewed Tobacco in the last 2 yrs please stop smoking, stop any regular Alcohol and or any Recreational drug use.  Wear Seat belts while driving.  Please note  You were cared for by a hospitalist during your hospital stay. Once you are discharged, your primary care physician will handle any further medical issues. Please note that NO REFILLS for any discharge medications will be authorized once you are discharged, as it is imperative that you return to your primary care physician (or establish a relationship with a primary care physician if you do not have one) for your aftercare needs so that they can reassess your need for medications and monitor your lab values.     Allergies  Allergen Reactions  . Sulfonamide Derivatives Other (See Comments)    Unknown; "childhood allergy/mother"      Disposition: Home with home health   Consults:   Cardiology    Significant Diagnostic Studies:  Dg Chest 2 View  Result Date: 12/14/2015 CLINICAL DATA:  Not being able to  sleep for 3 days due to tremors, shaking, thick tongued, altered mental status, fatigue, history hypertension, chronic combined systolic and diastolic CHF, ischemic cardiomyopathy, coronary artery disease EXAM: CHEST  2 VIEW COMPARISON:  12/05/2015 FINDINGS: LEFT subclavian transvenous pacemaker/AICD leads project at RIGHT atrium, RIGHT ventricle and coronary sinus. Enlargement of cardiac silhouette post CABG. Pulmonary vascular congestion. Mediastinal contour stable. Slightly improved pulmonary edema since previous exam. No segmental consolidation, pleural effusion or pneumothorax. Bones demineralized. IMPRESSION: Enlargement cardiac silhouette post CABG and AICD with pulmonary vascular congestion and slightly improved pulmonary edema. Electronically Signed   By: Lavonia Dana M.D.   On: 12/14/2015 13:31   Dg Chest 2 View  Result Date: 12/05/2015 CLINICAL DATA:  Decreased oxygen saturation. EXAM: CHEST  2 VIEW COMPARISON:  03/11/2015 FINDINGS: Stable dual lead cardiac pacemaker. Cardiomediastinal silhouette is stably enlarged. Mediastinal contours appear intact. Bilateral main pulmonary arteries are prominent. There is no evidence of pneumothorax. There is coarsening of the interstitial markings usually seen with interstitial pulmonary  edema. Osseous structures are without acute abnormality. Soft tissues are grossly normal. IMPRESSION: Findings suggestive of congestive heart failure with stable cardiomegaly and interval development of interstitial pulmonary edema. Probable pulmonary arterial hypertension. Electronically Signed   By: Fidela Salisbury M.D.   On: 12/05/2015 13:02  Dg Abd 1 View  Result Date: 12/14/2015 CLINICAL DATA:  Abdominal distension. EXAM: ABDOMEN - 1 VIEW COMPARISON:  Radiographs of March 19, 2013. FINDINGS: The bowel gas pattern is normal. No radio-opaque calculi or other significant radiographic abnormality are seen. IMPRESSION: No evidence of bowel obstruction or ileus.  Electronically Signed   By: Marijo Conception, M.D.   On: 12/14/2015 21:51   Ct Head Wo Contrast  Result Date: 12/14/2015 CLINICAL DATA:  Acute encephalopathy. Worsening confusion, fatigue, recurrent tremulousness and jerking movements. EXAM: CT HEAD WITHOUT CONTRAST TECHNIQUE: Contiguous axial images were obtained from the base of the skull through the vertex without intravenous contrast. COMPARISON:  None. FINDINGS: Brain: No intracranial hemorrhage, mass effect, or midline shift. No hydrocephalus. The basilar cisterns are patent. Mild to moderate chronic small vessel ischemia. No evidence of territorial infarct. No intracranial fluid collection. Vascular: No hyperdense vessel or abnormal calcification. Atherosclerosis of skullbase vasculature. Skull:  Calvarium is intact. Sinuses/Orbits: Included paranasal sinuses and mastoid air cells are well aerated. Other: None. IMPRESSION: Chronic small vessel ischemia without acute intracranial abnormality. Electronically Signed   By: Jeb Levering M.D.   On: 12/14/2015 21:28    TEE  FINDINGS:  LEFT VENTRICLE: EF = 40%. Global HK  RIGHT VENTRICLE: Moderately HK  LEFT ATRIUM: Dilated  LEFT ATRIAL APPENDAGE: Not visualized  RIGHT ATRIUM: Dilated  AORTIC VALVE:  Trileaflet. Mildly calcified. Left coronary cusp fused. No signifcant AS/AI  MITRAL VALVE:    Valve thickened. Mild MS. Mean gradient 75m HG. MVA 1.73cm2. Ruptured secondary on anterior leaflet of MV with mild prolapse. Mild MR.  TRICUSPID VALVE: Normal. Moderate TR  PULMONIC VALVE: Grossly normal. No PI  INTERATRIAL SEPTUM: No PFO or ASD.  PERICARDIUM: No effusion  DESCENDING AORTA:Mild plaque    Filed Weights   12/15/15 0541 12/16/15 0324 12/17/15 0454  Weight: 123.3 kg (271 lb 12.8 oz) 123.4 kg (272 lb 1.6 oz) 122.2 kg (269 lb 4.8 oz)     Microbiology: Recent Results (from the past 240 hour(s))  Culture, blood (routine x 2)     Status: None (Preliminary  result)   Collection Time: 12/16/15 10:10 AM  Result Value Ref Range Status   Specimen Description BLOOD LEFT WRIST  Final   Special Requests BOTTLES DRAWN AEROBIC AND ANAEROBIC  5CC  Final   Culture NO GROWTH < 12 HOURS  Final   Report Status PENDING  Incomplete  Culture, blood (routine x 2)     Status: None (Preliminary result)   Collection Time: 12/16/15 10:20 AM  Result Value Ref Range Status   Specimen Description BLOOD RIGHT HAND  Final   Special Requests BOTTLES DRAWN AEROBIC ONLY  5CC  Final   Culture NO GROWTH < 12 HOURS  Final   Report Status PENDING  Incomplete       Blood Culture    Component Value Date/Time   SDES BLOOD RIGHT HAND 12/16/2015 1020   SPECREQUEST BOTTLES DRAWN AEROBIC ONLY  5CC 12/16/2015 1020   CULT NO GROWTH < 12 HOURS 12/16/2015 1020   REPTSTATUS PENDING 12/16/2015 1020      Labs: Results for orders placed or performed during the hospital encounter of 12/14/15 (from the past 48 hour(s))  Troponin I     Status: Abnormal   Collection Time: 12/15/15 10:27 AM  Result Value Ref Range   Troponin I 0.03 (HH) <0.03 ng/mL    Comment: CRITICAL VALUE NOTED.  VALUE IS CONSISTENT WITH PREVIOUSLY REPORTED AND CALLED VALUE.  Protime-INR     Status: Abnormal   Collection Time: 12/15/15 10:27 AM  Result Value Ref Range   Prothrombin Time 25.1 (H) 11.4 - 15.2 seconds   INR 2.24   Glucose, capillary     Status: Abnormal   Collection Time: 12/15/15 11:28 AM  Result Value Ref Range   Glucose-Capillary 165 (H) 65 - 99 mg/dL   Comment 1 Notify RN   Blood gas, arterial     Status: Abnormal   Collection Time: 12/15/15 12:25 PM  Result Value Ref Range   O2 Content 2.0 L/min   Delivery systems NASAL CANNULA    pH, Arterial 7.325 (L) 7.350 - 7.450   pCO2 arterial 64.8 (HH) 35.0 - 45.0 mmHg    Comment: CRITICAL RESULT CALLED TO, READ BACK BY AND VERIFIED WITH: Emiliano Dyer, RN AT 1238 BY [facility] BROWNING,RRT RCP ON 12/15/2015    pO2, Arterial 59.5 (L) 80.0 - 100.0  mmHg   Bicarbonate 32.8 (H) 20.0 - 24.0 mEq/L   TCO2 34.8 0 - 100 mmol/L   Acid-Base Excess 7.0 (H) 0.0 - 2.0 mmol/L   O2 Saturation 88.2 %   Patient temperature 98.6    Collection site RIGHT RADIAL    Drawn by [id number]    Sample type ARTERIAL DRAW    Allens test (pass/fail) PASS PASS  Glucose, capillary     Status: Abnormal   Collection Time: 12/15/15  4:37 PM  Result Value Ref Range   Glucose-Capillary 123 (H) 65 - 99 mg/dL   Comment 1 Notify RN   Glucose, capillary     Status: Abnormal   Collection Time: 12/15/15  8:03 PM  Result Value Ref Range   Glucose-Capillary 172 (H) 65 - 99 mg/dL  Glucose, capillary     Status: None   Collection Time: 12/15/15 11:30 PM  Result Value Ref Range   Glucose-Capillary 93 65 - 99 mg/dL  Glucose, capillary     Status: None   Collection Time: 12/16/15  3:22 AM  Result Value Ref Range   Glucose-Capillary 68 65 - 99 mg/dL   Comment 1 Notify RN    Comment 2 Document in Chart   Comprehensive metabolic panel     Status: Abnormal   Collection Time: 12/16/15  3:43 AM  Result Value Ref Range   Sodium 137 135 - 145 mmol/L   Potassium 4.7 3.5 - 5.1 mmol/L   Chloride 96 (L) 101 - 111 mmol/L   CO2 34 (H) 22 - 32 mmol/L   Glucose, Bld 88 65 - 99 mg/dL   BUN 67 (H) 6 - 20 mg/dL   Creatinine, Ser 3.16 (H) 0.44 - 1.00 mg/dL   Calcium 8.9 8.9 - 10.3 mg/dL   Total Protein 6.5 6.5 - 8.1 g/dL   Albumin 3.5 3.5 - 5.0 g/dL   AST 22 15 - 41 U/L   ALT 17 14 - 54 U/L   Alkaline Phosphatase 32 (L) 38 - 126 U/L   Total Bilirubin 0.5 0.3 - 1.2 mg/dL   GFR calc non Af Amer 14 (L) >60 mL/min   GFR calc Af Amer 16 (L) >60 mL/min    Comment: (NOTE) The eGFR has been calculated using the CKD EPI equation. This calculation  has not been validated in all clinical situations. eGFR's persistently <60 mL/min signify possible Chronic Kidney Disease.    Anion gap 7 5 - 15  CBC     Status: Abnormal   Collection Time: 12/16/15  3:43 AM  Result Value Ref Range   WBC 6.4  4.0 - 10.5 K/uL   RBC 3.18 (L) 3.87 - 5.11 MIL/uL   Hemoglobin 9.6 (L) 12.0 - 15.0 g/dL   HCT 32.0 (L) 36.0 - 46.0 %   MCV 100.6 (H) 78.0 - 100.0 fL   MCH 30.2 26.0 - 34.0 pg   MCHC 30.0 30.0 - 36.0 g/dL   RDW 17.5 (H) 11.5 - 15.5 %   Platelets 202 150 - 400 K/uL  Glucose, capillary     Status: Abnormal   Collection Time: 12/16/15  3:55 AM  Result Value Ref Range   Glucose-Capillary 103 (H) 65 - 99 mg/dL  Protime-INR     Status: Abnormal   Collection Time: 12/16/15  5:53 AM  Result Value Ref Range   Prothrombin Time 22.5 (H) 11.4 - 15.2 seconds   INR 1.94   Culture, blood (routine x 2)     Status: None (Preliminary result)   Collection Time: 12/16/15 10:10 AM  Result Value Ref Range   Specimen Description BLOOD LEFT WRIST    Special Requests BOTTLES DRAWN AEROBIC AND ANAEROBIC  5CC    Culture NO GROWTH < 12 HOURS    Report Status PENDING   Culture, blood (routine x 2)     Status: None (Preliminary result)   Collection Time: 12/16/15 10:20 AM  Result Value Ref Range   Specimen Description BLOOD RIGHT HAND    Special Requests BOTTLES DRAWN AEROBIC ONLY  5CC    Culture NO GROWTH < 12 HOURS    Report Status PENDING   Glucose, capillary     Status: Abnormal   Collection Time: 12/16/15 11:48 AM  Result Value Ref Range   Glucose-Capillary 309 (H) 65 - 99 mg/dL  Glucose, capillary     Status: Abnormal   Collection Time: 12/16/15  3:28 PM  Result Value Ref Range   Glucose-Capillary 166 (H) 65 - 99 mg/dL  Glucose, capillary     Status: Abnormal   Collection Time: 12/16/15  4:35 PM  Result Value Ref Range   Glucose-Capillary 160 (H) 65 - 99 mg/dL  Glucose, capillary     Status: Abnormal   Collection Time: 12/16/15  7:43 PM  Result Value Ref Range   Glucose-Capillary 182 (H) 65 - 99 mg/dL   Comment 1 Notify RN    Comment 2 Document in Chart   Glucose, capillary     Status: Abnormal   Collection Time: 12/16/15 11:05 PM  Result Value Ref Range   Glucose-Capillary 217 (H) 65 -  99 mg/dL   Comment 1 Notify RN    Comment 2 Document in Chart   Protime-INR     Status: Abnormal   Collection Time: 12/17/15  3:48 AM  Result Value Ref Range   Prothrombin Time 22.6 (H) 11.4 - 15.2 seconds   INR 1.96   CBC     Status: Abnormal   Collection Time: 12/17/15  3:48 AM  Result Value Ref Range   WBC 8.2 4.0 - 10.5 K/uL   RBC 3.28 (L) 3.87 - 5.11 MIL/uL   Hemoglobin 10.0 (L) 12.0 - 15.0 g/dL   HCT 33.1 (L) 36.0 - 46.0 %   MCV 100.9 (H) 78.0 - 100.0 fL  MCH 30.5 26.0 - 34.0 pg   MCHC 30.2 30.0 - 36.0 g/dL   RDW 17.4 (H) 11.5 - 15.5 %   Platelets 209 150 - 400 K/uL  Comprehensive metabolic panel     Status: Abnormal   Collection Time: 12/17/15  3:48 AM  Result Value Ref Range   Sodium 139 135 - 145 mmol/L   Potassium 5.0 3.5 - 5.1 mmol/L   Chloride 98 (L) 101 - 111 mmol/L   CO2 34 (H) 22 - 32 mmol/L   Glucose, Bld 142 (H) 65 - 99 mg/dL   BUN 64 (H) 6 - 20 mg/dL   Creatinine, Ser 2.85 (H) 0.44 - 1.00 mg/dL   Calcium 9.0 8.9 - 10.3 mg/dL   Total Protein 5.8 (L) 6.5 - 8.1 g/dL   Albumin 3.4 (L) 3.5 - 5.0 g/dL   AST 21 15 - 41 U/L   ALT 14 14 - 54 U/L   Alkaline Phosphatase 31 (L) 38 - 126 U/L   Total Bilirubin 0.5 0.3 - 1.2 mg/dL   GFR calc non Af Amer 15 (L) >60 mL/min   GFR calc Af Amer 18 (L) >60 mL/min    Comment: (NOTE) The eGFR has been calculated using the CKD EPI equation. This calculation has not been validated in all clinical situations. eGFR's persistently <60 mL/min signify possible Chronic Kidney Disease.    Anion gap 7 5 - 15  Glucose, capillary     Status: Abnormal   Collection Time: 12/17/15  5:15 AM  Result Value Ref Range   Glucose-Capillary 144 (H) 65 - 99 mg/dL   Comment 1 Notify RN   Glucose, capillary     Status: Abnormal   Collection Time: 12/17/15  7:51 AM  Result Value Ref Range   Glucose-Capillary 142 (H) 65 - 99 mg/dL   *Note: Due to a large number of results and/or encounters for the requested time period, some results have not  been displayed. A complete set of results can be found in Results Review.     Lipid Panel     Component Value Date/Time   CHOL 148 07/14/2015 1041   TRIG 195.0 (H) 07/14/2015 1041   TRIG 1018 (HH) 04/22/2006 0900   HDL 40.30 07/14/2015 1041   CHOLHDL 4 07/14/2015 1041   VLDL 39.0 07/14/2015 1041   LDLCALC 68 07/14/2015 1041   LDLDIRECT 64.8 05/11/2014 0813     Lab Results  Component Value Date   HGBA1C 6.6 (H) 12/14/2015   HGBA1C 7.2 (H) 09/26/2014   HGBA1C 7.3 (H) 03/06/2013        74 y/o F history of CAD, status post CABG in 2003, ischemic cardiomyopathy with variable ejection fractions noted in the past (echo in 12/2011 demonstrated normal LV function with an EF 60-65%), status post CRT-D, chronic combined systolic and diastolic CHF, paroxysmal atrial fibrillation, prior LV mural thrombus, on chronic Coumadin therapy , who presented with worsening confusion, fatigue, recurrent tremulousness, and jerking movements. Admitting dx: acute encephalopathy in setting over diuresis and polypharmacy. She has had some episodes of hypoglycemia down to 40 at night. Pertinent labs on admission include K 5.5, Creatinine 3.38, BNP 758.8. Po torsemide recently increased as outpatient.Pt was seen in ED ~9 days prior to current admission for CHF exacerbation and treated with Lasix. Pt fell 2 weeks ago hitting the back of her head. Pt's PMH includes AICD, chronic back pain, CKD, gout, LBBB, morbid obesity, non-hodgkin's lymphoma,    Hospital course Acute encephalopathy - in the  setting of over diuresis /acute on chronic kidney injury and polypharmacy venous blood gas showed compensated hypercarbia currently appears to be alert and oriented. Given recent head trauma will obtain CT of the head. Patient status post Darcella Cheshire not a candidate for MRI, I suspect that she has side effects from regular meds due to change in her renal clearance , CT head negative , she has been taking 600 mg BID of gabapentin,  along with lorazepam  .ATRIAL FIBRILLATION, PAROXYSMAL - CHA2DS2 vasc score - 6 on chronic anticoagulation , patient has been on long term anticoagulation continuation which was discontinued by cardiology. Status post TEE that  showed  Ruptured secondary on anterior leaflet of MV with mild prolapse. Mild MR. INR 1.196 on the day of discharge   .Anemiachronic likely anemia of chronic disease currently at baseline, hg dropped 10.5>9.6  .Automatic implantable cardioverter-defibrillator in situ - stable continue to monitor not an MRI candidate  .Chronic combined systolic and diastolic CHF (congestive heart failure) ([place]) - possibly have being over diuresed. Patient still has abdominal distention but no peripheral swelling. Creatinine better than yesterday 3.4>3.16>2.85. Cardiology adjusted diuretics   . Restarted torsemide 40 mg a day. Cardiology to see Dr Aundra Dubin , ECHO with limited windows , TEE showed EF of 40% .We have reduced torsemide to '40mg'$  daily. Can take extra 20 mg in afternoon as needed for weight gain  .Diabetes mellitus type 2 with retinopathy ([place]) - controlled , cont NPH and SSI  .Essential hypertension - blood pressure currently improved on  Coreg, demadex  .OSA (obstructive sleep apnea) - CPAP QHS   constipation -  KUB   was negative for impaction  RLS ,insomnia - I suspect RLS , started low dose requip which seemed to have helped and lowered dose of gabapentin , to 300 mg at bedtime    Discharge Exam:  Blood pressure (!) 130/46, pulse 67, temperature 98.8 F (37.1 C), temperature source Oral, resp. rate 18, height '5\' 4"'$  (1.626 m), weight 122.2 kg (269 lb 4.8 oz), SpO2 95 %.  General: Morbidly obese, Elderly appearing. Husband present  HEENT: Normal Neck: JVP difficult to assess but does not appear elevated. Carotids 2+ bilaterally; no bruits. No thyromegaly or nodule noted. Cor: PMI normal. RRR. No gallops or rubs. 1/6 early SEM RUSB. Lungs: Clear, On  02.  Abdomen: Obese, soft, non-tender, mildly distended, no HSM. No bruits or masses. +BS  Extremities: no cyanosis, clubbing, rash. No peripheral edema.  Neuro: A&Ox3, cranial nerves grossly intact. Moves all 4 extremities w/o difficulty. Affect pleasant.    Follow-up Information    Penni Homans, MD. Schedule an appointment as soon as possible for a visit in 2 day(s).   Specialty:  Family Medicine Why:  Hospital follow-up, check CBC, BMP, INR Contact information: [address] [place] [address] [phone]        Glori Bickers, MD. Schedule an appointment as soon as possible for a visit in 1 week(s).   Specialty:  Cardiology Why:  Hospital follow-up Contact information: [place] [address] [phone]           Signed: Reyne Dumas 12/17/2015, 8:39 AM        Time spent >45 mins

## 2015-12-17 NOTE — Progress Notes (Signed)
ANTICOAGULATION CONSULT NOTE - Follow Up Consult  Pharmacy Consult for Coumadin Indication: hx mural thrombus and afib  Allergies  Allergen Reactions  . Sulfonamide Derivatives Other (See Comments)    Unknown; "childhood allergy/mother"    Patient Measurements: Height: 5\' 4"  (162.6 cm) Weight: 269 lb 4.8 oz (122.2 kg) (scale a) IBW/kg (Calculated) : 54.7  Vital Signs: Temp: 98.8 F (37.1 C) (08/05 0454) Temp Source: Oral (08/05 0454) BP: 130/46 (08/05 0454) Pulse Rate: 67 (08/05 0454)  Labs:  Recent Labs  12/14/15 2325 12/15/15 0423 12/15/15 1027 12/16/15 0343 12/16/15 0553 12/17/15 0348  HGB  --  9.6*  --  9.6*  --  10.0*  HCT  --  32.3*  --  32.0*  --  33.1*  PLT  --  223  --  202  --  209  LABPROT  --   --  25.1*  --  22.5* 22.6*  INR  --   --  2.24  --  1.94 1.96  CREATININE 3.51* 3.45*  --  3.16*  --  2.85*  TROPONINI <0.03 0.03* 0.03*  --   --   --     Estimated Creatinine Clearance: 22.7 mL/min (by C-G formula based on SCr of 2.85 mg/dL).  Assessment: 75yof continues on coumadin for hx mural thrombus and afib. INR 2.51 on admit and coumadin held due to AMS. CT head negative and coumadin resumed yesterday. Today's INR is just slightly below goal at 1.96. Continues on amiodarone as pta. CBC stable. No bleeding.  Home dose: 2.5mg  daily except 5mg  on Sun/Thurs  Goal of Therapy:  INR 2-3 Monitor platelets by anticoagulation protocol: Yes   Plan:  1) Coumadin 5mg  x 1 2) Daily INR  Deboraha Sprang 12/17/2015,11:10 AM

## 2015-12-17 NOTE — Progress Notes (Signed)
Advanced Heart Failure Rounding Note   Subjective:    Underwent TEE yesterday. EF 40% +RV dysfunction. Ruptured MV chord with only mild MR.  Feels well. No dyspnea. Creatinine improved.    Objective:   Weight Range:  Vital Signs:   Temp:  [97.6 F (36.4 C)-98.8 F (37.1 C)] 98.8 F (37.1 C) (08/05 0454) Pulse Rate:  [67-73] 67 (08/05 0454) Resp:  [14-28] 18 (08/05 0454) BP: (116-230)/(44-117) 130/46 (08/05 0454) SpO2:  [92 %-100 %] 95 % (08/05 0454) Weight:  [122.2 kg (269 lb 4.8 oz)] 122.2 kg (269 lb 4.8 oz) (08/05 0454) Last BM Date: 12/16/15  Weight change: Filed Weights   12/15/15 0541 12/16/15 0324 12/17/15 0454  Weight: 123.3 kg (271 lb 12.8 oz) 123.4 kg (272 lb 1.6 oz) 122.2 kg (269 lb 4.8 oz)    Intake/Output:   Intake/Output Summary (Last 24 hours) at 12/17/15 0933 Last data filed at 12/17/15 0455  Gross per 24 hour  Intake               30 ml  Output             1403 ml  Net            -1373 ml     Physical Exam: General:  Well appearing. No resp difficulty. Lying flat in bed  HEENT: normal Neck: supple. JVP difficult to assess due to body habitus . Carotids 2+ bilat; no bruits. No lymphadenopathy or thryomegaly appreciated. Cor: PMI nondisplaced. Regular rate & rhythm. No rubs, gallops or murmurs. Lungs: clear Abdomen:  Obese, soft, nontender, nondistended. No hepatosplenomegaly. No bruits or masses. Good bowel sounds. Extremities: no cyanosis, clubbing, rash, edema Neuro: alert & orientedx3, cranial nerves grossly intact. moves all 4 extremities w/o difficulty. Affect pleasant  Telemetry: A pced V sensed 70  Labs: Basic Metabolic Panel:  Recent Labs Lab 12/14/15 1232 12/14/15 2325 12/15/15 0423 12/16/15 0343 12/17/15 0348  NA 135 133* 136 137 139  K 5.5* 5.3* 5.0 4.7 5.0  CL 93* 94* 96* 96* 98*  CO2 34* 34* 35* 34* 34*  GLUCOSE 113* 207* 97 88 142*  BUN 57* 62* 62* 67* 64*  CREATININE 3.38* 3.51* 3.45* 3.16* 2.85*  CALCIUM 9.2  8.9 8.7* 8.9 9.0  MG  --   --  2.4  --   --   PHOS  --   --  4.1  --   --     Liver Function Tests:  Recent Labs Lab 12/15/15 0423 12/16/15 0343 12/17/15 0348  AST 24 22 21   ALT 17 17 14   ALKPHOS 33* 32* 31*  BILITOT 0.4 0.5 0.5  PROT 6.4* 6.5 5.8*  ALBUMIN 3.6 3.5 3.4*   No results for input(s): LIPASE, AMYLASE in the last 168 hours. No results for input(s): AMMONIA in the last 168 hours.  CBC:  Recent Labs Lab 12/14/15 1232 12/15/15 0423 12/16/15 0343 12/17/15 0348  WBC 8.0 7.5 6.4 8.2  HGB 10.5* 9.6* 9.6* 10.0*  HCT 34.8* 32.3* 32.0* 33.1*  MCV 100.9* 100.6* 100.6* 100.9*  PLT 222 223 202 209    Cardiac Enzymes:  Recent Labs Lab 12/14/15 2325 12/15/15 0423 12/15/15 1027  TROPONINI <0.03 0.03* 0.03*    BNP: BNP (last 3 results)  Recent Labs  05/18/15 1045 12/05/15 1144 12/15/15 0423  BNP 298.3* 655.1* 758.8*    ProBNP (last 3 results) No results for input(s): PROBNP in the last 8760 hours.    Other results:  Imaging: No results found.   Medications:     Scheduled Medications: . amiodarone  200 mg Oral BID  . atorvastatin  20 mg Oral QHS  . carvedilol  50 mg Oral BID  . escitalopram  10 mg Oral Daily  . fenofibrate  54 mg Oral Daily  . gabapentin  150 mg Oral QHS  . insulin aspart  0-9 Units Subcutaneous Q4H  . insulin NPH Human  10 Units Subcutaneous QHS  . insulin NPH Human  20 Units Subcutaneous Q breakfast  . levothyroxine  25 mcg Oral QAC breakfast  . rOPINIRole  0.25 mg Oral QHS  . sodium chloride flush  3 mL Intravenous Q12H  . torsemide  40 mg Oral Daily  . Warfarin - Pharmacist Dosing Inpatient   Does not apply q1800    Infusions:    PRN Medications: acetaminophen **OR** acetaminophen, albuterol, fluticasone, LORazepam, ondansetron **OR** ondansetron (ZOFRAN) IV, sodium chloride   Assessment:   1. ARF on CKD stage III 2. Chronic combined CHF 3. PAF s/p DCCV 07/28/14 Chronic Afib 4. OHS/OSA 5. CAD s/p  CABG 6. Carotid stenosis 7. Confusion 8. Ruptured MV chord   Plan/Discussion:    Volume status stable. TEE results reviewed with her and her husband. EF 40%. RV dysfunction. Ruptured MV chord.   We have reduced torsemide to 40mg  daily. Can take extra 20 mg in afternoon as needed for weight gain.   Discussed importance of losing weight to help treat her obesity hypoventilation syndrome.   OK to go home from our perspective. We will sign off. F/u in HF Clinic and with Renal.   Length of Stay: 0 Bensimhon, Daniel MD 12/17/2015, 9:33 AM  Advanced Heart Failure Team Pager [phone] (M-F; 7a - 4p)  Please contact [facility] for night-coverage after hours (4p -7a ) and weekends on [email or web address]

## 2015-12-18 ENCOUNTER — Encounter (HOSPITAL_COMMUNITY): Payer: Self-pay | Admitting: Internal Medicine

## 2015-12-19 ENCOUNTER — Telehealth: Payer: Self-pay | Admitting: Internal Medicine

## 2015-12-19 ENCOUNTER — Ambulatory Visit (INDEPENDENT_AMBULATORY_CARE_PROVIDER_SITE_OTHER): Payer: Medicare Other | Admitting: Pharmacist

## 2015-12-19 ENCOUNTER — Telehealth: Payer: Self-pay | Admitting: Behavioral Health

## 2015-12-19 DIAGNOSIS — I481 Persistent atrial fibrillation: Secondary | ICD-10-CM

## 2015-12-19 DIAGNOSIS — I4819 Other persistent atrial fibrillation: Secondary | ICD-10-CM

## 2015-12-19 DIAGNOSIS — Z5181 Encounter for therapeutic drug level monitoring: Secondary | ICD-10-CM

## 2015-12-19 NOTE — Telephone Encounter (Signed)
Spoke with pt.  INR from hospital addressed.  See anti-coag note for details.

## 2015-12-19 NOTE — Telephone Encounter (Signed)
New message     Pt was due to have PT/INR check last week.  She was in the hosp.  Calling to see if you know her PT/INR results from the hosp and will her medication need to be adjusted?

## 2015-12-19 NOTE — Telephone Encounter (Signed)
Patient declined TCM/Hospital Follow-up. She stated, "I'm doing ok and I already have an appointment scheduled with Dr. Charlett Blake next month and would rather see her at that time". Also, patient voiced that soon she will be following-up with both Dr. Haroldine Laws & Dr. Lovena Le.

## 2015-12-20 NOTE — Progress Notes (Unsigned)
ICM remote transmission rescheduled for 01/11/2016.  Patient has HF clinic appointment on 12/21/2015 and office defib check with Dr Lovena Le on 12/28/2015.

## 2015-12-21 ENCOUNTER — Encounter (HOSPITAL_COMMUNITY): Payer: Self-pay | Admitting: Internal Medicine

## 2015-12-21 ENCOUNTER — Ambulatory Visit (HOSPITAL_COMMUNITY)
Admission: RE | Admit: 2015-12-21 | Discharge: 2015-12-21 | Disposition: A | Discharge status: Home / Self Care | Payer: Medicare Other | Source: Ambulatory Visit | Attending: Internal Medicine | Admitting: Internal Medicine

## 2015-12-21 VITALS — BP 122/60 | HR 70 | Wt 267.5 lb

## 2015-12-21 DIAGNOSIS — Z951 Presence of aortocoronary bypass graft: Secondary | ICD-10-CM | POA: Diagnosis not present

## 2015-12-21 DIAGNOSIS — E039 Hypothyroidism, unspecified: Secondary | ICD-10-CM | POA: Diagnosis not present

## 2015-12-21 DIAGNOSIS — I255 Ischemic cardiomyopathy: Secondary | ICD-10-CM | POA: Diagnosis not present

## 2015-12-21 DIAGNOSIS — Z9981 Dependence on supplemental oxygen: Secondary | ICD-10-CM | POA: Diagnosis not present

## 2015-12-21 DIAGNOSIS — I5022 Chronic systolic (congestive) heart failure: Secondary | ICD-10-CM | POA: Diagnosis not present

## 2015-12-21 DIAGNOSIS — Z79899 Other long term (current) drug therapy: Secondary | ICD-10-CM | POA: Insufficient documentation

## 2015-12-21 DIAGNOSIS — I5042 Chronic combined systolic (congestive) and diastolic (congestive) heart failure: Secondary | ICD-10-CM | POA: Diagnosis not present

## 2015-12-21 DIAGNOSIS — E785 Hyperlipidemia, unspecified: Secondary | ICD-10-CM | POA: Diagnosis not present

## 2015-12-21 DIAGNOSIS — I6523 Occlusion and stenosis of bilateral carotid arteries: Secondary | ICD-10-CM

## 2015-12-21 DIAGNOSIS — I130 Hypertensive heart and chronic kidney disease with heart failure and stage 1 through stage 4 chronic kidney disease, or unspecified chronic kidney disease: Secondary | ICD-10-CM | POA: Insufficient documentation

## 2015-12-21 DIAGNOSIS — Z8572 Personal history of non-Hodgkin lymphomas: Secondary | ICD-10-CM | POA: Insufficient documentation

## 2015-12-21 DIAGNOSIS — Z7901 Long term (current) use of anticoagulants: Secondary | ICD-10-CM | POA: Diagnosis not present

## 2015-12-21 DIAGNOSIS — E1122 Type 2 diabetes mellitus with diabetic chronic kidney disease: Secondary | ICD-10-CM | POA: Insufficient documentation

## 2015-12-21 DIAGNOSIS — E1140 Type 2 diabetes mellitus with diabetic neuropathy, unspecified: Secondary | ICD-10-CM | POA: Diagnosis not present

## 2015-12-21 DIAGNOSIS — Z794 Long term (current) use of insulin: Secondary | ICD-10-CM | POA: Diagnosis not present

## 2015-12-21 DIAGNOSIS — I480 Paroxysmal atrial fibrillation: Secondary | ICD-10-CM

## 2015-12-21 DIAGNOSIS — I482 Chronic atrial fibrillation: Secondary | ICD-10-CM | POA: Insufficient documentation

## 2015-12-21 DIAGNOSIS — E11319 Type 2 diabetes mellitus with unspecified diabetic retinopathy without macular edema: Secondary | ICD-10-CM | POA: Diagnosis not present

## 2015-12-21 DIAGNOSIS — G4733 Obstructive sleep apnea (adult) (pediatric): Secondary | ICD-10-CM

## 2015-12-21 DIAGNOSIS — I2510 Atherosclerotic heart disease of native coronary artery without angina pectoris: Secondary | ICD-10-CM | POA: Insufficient documentation

## 2015-12-21 DIAGNOSIS — Z95810 Presence of automatic (implantable) cardiac defibrillator: Secondary | ICD-10-CM | POA: Insufficient documentation

## 2015-12-21 DIAGNOSIS — E662 Morbid (severe) obesity with alveolar hypoventilation: Secondary | ICD-10-CM | POA: Diagnosis not present

## 2015-12-21 DIAGNOSIS — N183 Chronic kidney disease, stage 3 (moderate): Secondary | ICD-10-CM | POA: Insufficient documentation

## 2015-12-21 DIAGNOSIS — Z6841 Body Mass Index (BMI) 40.0 and over, adult: Secondary | ICD-10-CM | POA: Insufficient documentation

## 2015-12-21 LAB — CULTURE, BLOOD (ROUTINE X 2)
CULTURE: NO GROWTH
CULTURE: NO GROWTH

## 2015-12-21 NOTE — Progress Notes (Signed)
[name], female   DOB: 26-Apr-1941, 75 y.o.   MRN: [id number]    Advanced Heart Failure Clinic Note   PCP: Dr Charlett Blake Nephrologist: Dr Marval Regal EP: Dr Lovena Le  Pulmonary: Dr Gwenette Greet  HPI: Destiny [name] is a 75 y.o. female with a history of CAD, status post CABG in 2003, ischemic cardiomyopathy with variable ejection fractions noted in the past (echo in 12/2011 demonstrated normal LV function with an EF 60-65%), status post CRT-D, chronic combined systolic and diastolic CHF, paroxysmal atrial fibrillation, prior LV mural thrombus, on chronic Coumadin therapy, CKD, T2DM. She has a prior admission to the hospital for acute renal failure in the setting of rhabdomyolysis (while taking colchicine and fenofibrate). Carotid US (12/14): Bilateral ICA stenosis 40-59%.    She was admitted 10/23-11/11/14 with acute on chronic combined systolic and diastolic CHF. ECHO with worsening LV function with an EF of 35-40%. Hospital stay was complicated by a/c renal failure and respiratory failure. Had short term intubation. She was made a DNR/DNI.  Discharge weight 256 lbs. She is on home oxygen 2L by nasal cannula and CPAP at night, OHS/OSA.   Admitted 12/14/15 with AKI and hypoglycemia.  ICD interrogation showed no VT but impedance down suggesting fluid overload. She was thought to be dry on exam, especially with AKI.  Limited echo 12/15/15 with probable EF 40-45%, but concerning for mobile density attached to mitral valve.     TEE 12/16/15 showed EF 40% with RV dysfunction. Ruptured MV chord and only mild MVR.   She presents today for post follow up. Is feeling good since discharge.  Diuresing fine on decreased torsemide. She has not taken any extra. Weight at home 265.2 this morning, which is up from her previous weights, but down from getting home from the hospital.  Wears 2 liters oxygen via [place] chronically, 3 liters with exertion. Wearing CPAP every night. Limiting salt and fluid. Uses a walker in the  house but a motorized wheelchair everywhere else. Mostly due to back and hip pain. Breathing has been fine.  Does get SOB changing clothes and showering. Denies any bleeding problems.   Corvue-  MOD not functioning properly in clinic today.   ECHO (03/06/13): EF 35-40% ECHO (09/07/13): EF 40%, grade II diastolic dysfunction, mildly decreased RV systolic function with normal RV size. ECHO 10/2014:  EF ~30%. Floating MV chord - which is stable from previous echo. Carotid dopplers (1/16) with 40-59% bilateral ICA stenosis.   Labs  05/01/13 K 5.4 Creatinine 1.46 Pro BNP 646  05/19/13 K 5.0 Creatinine 1.38 3/15 LDL 41, HDL 38, creatinine 1.7 4/15 K 5.2, creatinine 1.8 09/16/13 K 4.6 Creatinine 1.6 10/15 K 5.2, creatinine 1.6, LDL 71, HDL 33, TGs 425, TSH normal 12/15 LDL 65, TGs 337 03/12/15: K 4.2 Creatinine 2.70  ROS: All systems negative except as listed in HPI, PMH and Problem List.  Past Medical History:  Diagnosis Date  . AICD (automatic cardioverter/defibrillator) present   . Anemia, unspecified 12/13/2013  . Back pain, chronic    "just when I walk; mass on 3rd and 4th vertebrae right lower back"  . Biventricular implantable cardiac defibrillator in situ 2007, 2012   a. 2007;  b. 2012 Gen change: SJM 3231-40 Uni BiV ICD, ser # [id number].  Marland Kitchen CAD (coronary artery disease)    a. 05/2001 CABG x4: LIMA->LAD, VG->D1, VG->D2, VG->RCA.  . Cardiomyopathy, ischemic    a. 2012 s/p SJM 3231-40 Uni BiV ICD, ser # [id number].  . Carotid  stenosis    a. 10/19/2011 carotid duplex - Mild hard plaque bilaterally. Stable 40-59% bilateral ICA stenosis. Carotid US (04/2013):  Bilateral 40-59% ICA.  F/u 1 year  . Cerumen impaction 10/28/2015  . Chronic combined systolic and diastolic CHF (congestive heart failure) (HCC)    a. EF as low as 25% in 2006;  b. EF 60-65% in 12/2011;  c. 02/2013 Echo: EF 35-40, mod mid-dist antsept HK, Gr 2 DD, mild LVH.  . CKD (chronic kidney disease), stage III   . DM (diabetes mellitus),  type 2 with complications (HCC)    insulin dependent, retinopathy, neuropathy  . Dyslipidemia   . Gout ~ 08/2011  . Hypercalcemia 09/26/2013  . Hypertension   . Hypothyroidism   . Hypoxemia requiring supplemental oxygen   . LBBB (left bundle branch block)   . Morbid obesity with BMI of 45.0-49.9, adult (HCC)    Ht. 5'4". BMI 47.2  . Mural thrombus of left ventricle (HCC)    before 2003, while on Coumadin, No h/o CVA  . 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 Benay Spice, in remission  . On home oxygen therapy    "normally 2-3L; 24/7" (12/14/2015)  . OSA on CPAP   . PAF (paroxysmal atrial fibrillation) (HCC)    on Coumadin  . Presence of permanent cardiac pacemaker   . 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 Vitamin D deficiency 06/09/2012   historical    Current Outpatient Prescriptions  Medication Sig Dispense Refill  . acetaminophen (TYLENOL) 500 MG tablet Take 1,000 mg by mouth 2 (two) times daily as needed for mild pain.     Marland Kitchen albuterol (PROVENTIL HFA;VENTOLIN HFA) 108 (90 BASE) MCG/ACT inhaler Inhale 2 puffs into the lungs every 6 (six) hours as needed for wheezing or shortness of breath. 1 Inhaler 1  . amiodarone (PACERONE) 200 MG tablet Take 200 mg by mouth daily.    Marland Kitchen atorvastatin (LIPITOR) 20 MG tablet TAKE 1 TABLET EVERY DAY 90 tablet 1  . carvedilol (COREG) 25 MG tablet TAKE 2 TABLETS TWICE DAILY 360 tablet 3  . chlorpheniramine (CHLOR-TRIMETON) 4 MG tablet Take 4 mg by mouth 2 (two) times daily.    Marland Kitchen escitalopram (LEXAPRO) 10 MG tablet TAKE 1 TABLET EVERY DAY 90 tablet 1  . fenofibrate (TRICOR) 48 MG tablet TAKE 1 TABLET EVERY DAY 90 tablet 3  . fluticasone (FLONASE) 50 MCG/ACT nasal spray Place 2 sprays into both nostrils daily as needed for allergies or rhinitis. As needed for nasal stuffiness. 16 g 0  . gabapentin (NEURONTIN) 300 MG capsule Take 300 mg by mouth at bedtime.    Marland Kitchen glucose blood  (FREESTYLE LITE) test strip DX: 250.60  Check sugars bid and as needed 100 each 2  . insulin NPH (HUMULIN N,NOVOLIN N) 100 UNIT/ML injection Inject 15-30 Units into the skin 2 (two) times daily before a meal. Take 20-30 units in the morning and 10-15 units in the evening    . insulin regular (NOVOLIN R,HUMULIN R) 100 units/mL injection Change to 15 units three times a with meals (to cover all meals--this is the same total amount but spread out) (Patient taking differently: Inject 10-15 Units into the skin 2 (two) times daily before a meal. Change to 15 units three times a with meals (to cover all meals--this is the same total amount but spread out)) 10 mL 12  . levothyroxine (SYNTHROID, LEVOTHROID) 25 MCG tablet TAKE  1 TABLET EVERY DAY BEFORE BREAKFAST 90 tablet 1  . LORazepam (ATIVAN) 0.5 MG tablet Take 1 tablet (0.5 mg total) by mouth at bedtime. 90 tablet 1  . OXYGEN Inhale 3 L into the lungs continuous.    Marland Kitchen rOPINIRole (REQUIP) 0.25 MG tablet Take 1 tablet (0.25 mg total) by mouth at bedtime. 30 tablet 1  . torsemide (DEMADEX) 20 MG tablet Take 2 tablets (40 mg total) by mouth daily. 30 tablet 2  . Vitamin D, Ergocalciferol, (DRISDOL) 50000 UNITS CAPS capsule Take 50,000 Units by mouth every Tuesday.     . warfarin (COUMADIN) 5 MG tablet TAKE AS DIRECTED BY ANTICOAGULATION CLINIC. (Patient taking differently: TAKES 1 TAB (5 mg) ON SUN AND THURS, TAKES 1/2 TAB (2.5 mg) ALL OTHER DAYS) 70 tablet 1   No current facility-administered medications for this encounter.     Vitals:   12/21/15 1402  BP: 122/60  Pulse: 70  SpO2: 94%  Weight: 267 lb 8 oz (121.3 kg)   Wt Readings from Last 3 Encounters:  12/21/15 267 lb 8 oz (121.3 kg)  12/17/15 269 lb 4.8 oz (122.2 kg)  12/05/15 267 lb 13.7 oz (121.5 kg)    PHYSICAL EXAM: General:  Obese, Elderly appearing, NAD;  Husband present. Arrived in scooter.  HEENT: normal; on 2 L continuous 02 via [place]. Neck: Thick, supple. JVP difficult to assess but  does not appear elevated.  Carotids 2+ bilaterally; no bruits. No thyromegaly or lymphadenopathy noted.  Cor: PMI normal. RRR. No gallops or rubs.  1/6 early SEM RUSB. Lungs: Clear, normal effort.  Abdomen: Obese, soft, NT, ND, no HSM. No bruits or masses. +BS   Extremities: no cyanosis, clubbing, rash. No LE edema.  Neuro: A&Ox3, cranial nerves grossly intact. Moves all 4 extremities w/o difficulty. Affect pleasant.  ASSESSMENT & PLAN:  1) Chronic combined diastolic/systolic HF: Ischemic cardiomyopathy s/p CRT-D ([employer]). TEE 12/16/15 with EF 40%, RV dysfunction, and floating MV cord similar to previous Echos.  - NYHA III - early IIIb symptoms. Volume status stable on exam. - Continue sliding scale torsemide with 40 mg daily. She can take an extra 20 mg as needed for 2-3 lb weight gain.  - Continue Coreg 25 mg BID - No spironolactone with CKD. Off enalapril for same.  - Reinforced the need and importance of daily weights, a low sodium diet, and fluid restriction (less than 2 L a day). Instructed to call the HF clinic if weight increases more than 3 lbs overnight or 5 lbs in a week. 2) PAF: S/P DC-CV 07/28/14. Chronic A fib. - No AFib by recent corevue.  - Continue BB and coumadin. - No bleeding on coumadin. Coumadin clinic follows.  3) OHS/OSA - Continue nightly CPAP and chronic home oxygen.  4) CAD: s/p CABG.   - Denies CP. No ASA with stable CAD and coumadin use. - Continue statin as below. 5) Hyperlipidemia:  - Tolerating atorvastatin 20 mg daily (unable to tolerate 40 mg with myalgias) - Triglycerides very high in 02/2015, continue fenofibrate 48 mg daily.  - Further management per PCP.  6) Carotid stenosis:  - Bilateral 1-39% stenosis - Repeat dopplers 05/2016.  7) CKD Stage III-IV:  Creatinine baseline 2.7-2.8.  - Has follow up with Dr Marval Regal in September   - Stable on discharge.   Shirley Friar PA-C  12/21/2015   Patient seen and examined with Oda Kilts,  PA-C. We discussed all aspects of the encounter. I agree with the assessment  and plan as stated above.   She looks very good post-hospital. Volume status improved. TEE in hospital showed ruptured MV chord without significant MR. Check labs today. Reinforced need for daily weights and reviewed use of sliding scale diuretics. Continue f/u with Renal.   Jennaya Pogue,MD 9:45 PM

## 2015-12-21 NOTE — Patient Instructions (Signed)
Your physician recommends that you schedule a follow-up appointment in: 4-6 weeks  

## 2015-12-28 ENCOUNTER — Ambulatory Visit (INDEPENDENT_AMBULATORY_CARE_PROVIDER_SITE_OTHER): Payer: Medicare Other | Admitting: Internal Medicine

## 2015-12-28 ENCOUNTER — Ambulatory Visit (INDEPENDENT_AMBULATORY_CARE_PROVIDER_SITE_OTHER): Payer: Medicare Other | Admitting: *Deleted

## 2015-12-28 ENCOUNTER — Other Ambulatory Visit: Payer: Self-pay

## 2015-12-28 ENCOUNTER — Encounter: Payer: Self-pay | Admitting: Internal Medicine

## 2015-12-28 ENCOUNTER — Telehealth: Payer: Self-pay | Admitting: Cardiology

## 2015-12-28 VITALS — BP 108/62 | HR 73 | Ht 64.0 in | Wt 268.0 lb

## 2015-12-28 DIAGNOSIS — I482 Chronic atrial fibrillation, unspecified: Secondary | ICD-10-CM

## 2015-12-28 DIAGNOSIS — I5022 Chronic systolic (congestive) heart failure: Secondary | ICD-10-CM

## 2015-12-28 DIAGNOSIS — I6350 Cerebral infarction due to unspecified occlusion or stenosis of unspecified cerebral artery: Secondary | ICD-10-CM | POA: Diagnosis not present

## 2015-12-28 DIAGNOSIS — I480 Paroxysmal atrial fibrillation: Secondary | ICD-10-CM | POA: Diagnosis not present

## 2015-12-28 DIAGNOSIS — Z5181 Encounter for therapeutic drug level monitoring: Secondary | ICD-10-CM

## 2015-12-28 DIAGNOSIS — I4891 Unspecified atrial fibrillation: Secondary | ICD-10-CM | POA: Diagnosis not present

## 2015-12-28 LAB — CUP PACEART INCLINIC DEVICE CHECK
Brady Statistic RV Percent Paced: 99.91 %
HighPow Impedance: 40.4135
Implantable Lead Implant Date: 20070322
Implantable Lead Implant Date: 20070322
Implantable Lead Location: 753860
Lead Channel Impedance Value: 287.5 Ohm
Lead Channel Impedance Value: 350 Ohm
Lead Channel Pacing Threshold Amplitude: 1.25 V
Lead Channel Pacing Threshold Pulse Width: 0.6 ms
Lead Channel Sensing Intrinsic Amplitude: 3.6 mV
Lead Channel Setting Pacing Pulse Width: 0.6 ms
Lead Channel Setting Sensing Sensitivity: 0.5 mV
MDC IDC LEAD IMPLANT DT: 20070322
MDC IDC LEAD LOCATION: 753858
MDC IDC LEAD LOCATION: 753859
MDC IDC LEAD MODEL: 7001
MDC IDC MSMT BATTERY REMAINING LONGEVITY: 15.6
MDC IDC MSMT LEADCHNL LV IMPEDANCE VALUE: 262.5 Ohm
MDC IDC MSMT LEADCHNL RA PACING THRESHOLD AMPLITUDE: 0.75 V
MDC IDC MSMT LEADCHNL RA PACING THRESHOLD PULSEWIDTH: 0.5 ms
MDC IDC MSMT LEADCHNL RV PACING THRESHOLD AMPLITUDE: 0.75 V
MDC IDC MSMT LEADCHNL RV PACING THRESHOLD PULSEWIDTH: 0.5 ms
MDC IDC MSMT LEADCHNL RV SENSING INTR AMPL: 11.7 mV
MDC IDC SESS DTM: 20170816110455
MDC IDC SET LEADCHNL LV PACING AMPLITUDE: 2.125
MDC IDC SET LEADCHNL RA PACING AMPLITUDE: 1.875
MDC IDC SET LEADCHNL RV PACING AMPLITUDE: 2.5 V
MDC IDC SET LEADCHNL RV PACING PULSEWIDTH: 0.5 ms
MDC IDC STAT BRADY RA PERCENT PACED: 99.84 %
Pulse Gen Serial Number: 631685

## 2015-12-28 LAB — POCT INR: INR: 3.2

## 2015-12-28 NOTE — Patient Outreach (Signed)
[facility] Care Management  12/28/2015  Destiny [name] 31-Oct-1940 [id number]     EMMI- HF RED ON EMMI ALERT Day # 7 Date: 12/26/15 Red Alert Reason: "new/worsening problems? Yes  New swelling? Yes"    Outreach attempt #1 to patient. No answer at present. RN CM left HIPAA compliant voicemail message along with contact info.    Plan: RN CM will make outreach attempt to patient within 24 hrs.    Enzo Montgomery, RN,BSN,CCM [place] [facility] Telephonic Care Management Coordinator Direct Phone: [phone] Toll Free: [phone] Fax: [phone]

## 2015-12-28 NOTE — Patient Outreach (Signed)
[facility]) Care Management  12/28/2015  GHAZAL GOFF 03/06/1941 [id number]   EMMI- HF RED ON EMMI ALERT Day # 7 Date: 12/26/15 Red Alert Reason: "new/worsening problems? Yes  New swelling? Yes"    Incoming call received from patient returning RN CM call. Reviewed and discussed red alert. Patient states that she was not feeling well that day. She attributes it to "having a bout of diarrhea." she is unsure of cause but states it only lasted one day and has since resolved. She reports that she saw heart MD today and got a good report. Patient reported that MD told her she didn't not have any issues with swelling at this time. Patient voices she is weighing daily and weigh staying about the same. She is adhering to med regimen and denies any issues with meds. She voices that she has supportive spouse and other family who are helping take care of her and take her to appointments. Patient states she decided against [facility] services as she doesn't think she needs them at this time. She denies any RN CM needs or concerns at this time. Advised patient that she would continue to get automated EMMI calls to monitor and track discharge progression.    Plan: RN CM will notify [facility] administrative assistant of case status.   Enzo Montgomery, RN,BSN,CCM [place] [facility] Telephonic Care Management Coordinator Direct Phone: [phone] Toll Free: [phone] Fax: [phone]

## 2015-12-28 NOTE — Progress Notes (Signed)
HPI [name] returns today for followup. She is a very pleasant 75 year old woman with an ischemic cardiomyopathy, chronic systolic heart failure, left bundle branch block, status post biventricular ICD implantation.  She is chronically oxygen dependent and desaturates at rest without oxygen. She does have morbid obesity and hypertension. She has persistent atrial fib and we reluctantly started her on amiodarone over a year ago. She has maintained NSR. She was in the hospital last week with dehydration due to too much diuretic therapy.  Allergies  Allergen Reactions  . Sulfonamide Derivatives Other (See Comments)    Unknown; "childhood allergy/mother"     Current Outpatient Prescriptions  Medication Sig Dispense Refill  . acetaminophen (TYLENOL) 500 MG tablet Take 1,000 mg by mouth 2 (two) times daily as needed for mild pain.     Marland Kitchen albuterol (PROVENTIL HFA;VENTOLIN HFA) 108 (90 BASE) MCG/ACT inhaler Inhale 2 puffs into the lungs every 6 (six) hours as needed for wheezing or shortness of breath. 1 Inhaler 1  . amiodarone (PACERONE) 200 MG tablet Take 200 mg by mouth daily.    Marland Kitchen atorvastatin (LIPITOR) 20 MG tablet TAKE 1 TABLET EVERY DAY 90 tablet 1  . carvedilol (COREG) 25 MG tablet TAKE 2 TABLETS TWICE DAILY 360 tablet 3  . chlorpheniramine (CHLOR-TRIMETON) 4 MG tablet Take 4 mg by mouth 2 (two) times daily.    Marland Kitchen escitalopram (LEXAPRO) 10 MG tablet TAKE 1 TABLET EVERY DAY 90 tablet 1  . fenofibrate (TRICOR) 48 MG tablet TAKE 1 TABLET EVERY DAY 90 tablet 3  . fluticasone (FLONASE) 50 MCG/ACT nasal spray Place 2 sprays into both nostrils daily as needed for allergies or rhinitis. As needed for nasal stuffiness. 16 g 0  . gabapentin (NEURONTIN) 300 MG capsule Take 300 mg by mouth at bedtime.    Marland Kitchen glucose blood (FREESTYLE LITE) test strip DX: 250.60  Check sugars bid and as needed 100 each 2  . insulin NPH (HUMULIN N,NOVOLIN N) 100 UNIT/ML injection Inject 15-30 Units into the skin 2 (two)  times daily before a meal. Take 20-30 units in the morning and 10-15 units in the evening    . insulin regular (NOVOLIN R,HUMULIN R) 100 units/mL injection Change to 15 units three times a with meals (to cover all meals--this is the same total amount but spread out) (Patient taking differently: Inject 10-15 Units into the skin 2 (two) times daily before a meal. Change to 15 units three times a with meals (to cover all meals--this is the same total amount but spread out)) 10 mL 12  . levothyroxine (SYNTHROID, LEVOTHROID) 25 MCG tablet TAKE 1 TABLET EVERY DAY BEFORE BREAKFAST 90 tablet 1  . LORazepam (ATIVAN) 0.5 MG tablet Take 1 tablet (0.5 mg total) by mouth at bedtime. 90 tablet 1  . OXYGEN Inhale 3 L into the lungs continuous.    Marland Kitchen rOPINIRole (REQUIP) 0.25 MG tablet Take 1 tablet (0.25 mg total) by mouth at bedtime. 30 tablet 1  . torsemide (DEMADEX) 20 MG tablet Take 2 tablets (40 mg total) by mouth daily. 30 tablet 2  . Vitamin D, Ergocalciferol, (DRISDOL) 50000 UNITS CAPS capsule Take 50,000 Units by mouth every Tuesday.     . warfarin (COUMADIN) 5 MG tablet TAKE AS DIRECTED BY ANTICOAGULATION CLINIC. (Patient taking differently: TAKES 1 TAB (5 mg) ON SUN AND THURS, TAKES 1/2 TAB (2.5 mg) ALL OTHER DAYS) 70 tablet 1   No current facility-administered medications for this visit.      Past Medical History:  Diagnosis Date  . AICD (automatic cardioverter/defibrillator) present   . Anemia, unspecified 12/13/2013  . Back pain, chronic    "just when I walk; mass on 3rd and 4th vertebrae right lower back"  . Biventricular implantable cardiac defibrillator in situ 2007, 2012   a. 2007;  b. 2012 Gen change: SJM 3231-40 Uni BiV ICD, ser # [id number].  Marland Kitchen CAD (coronary artery disease)    a. 05/2001 CABG x4: LIMA->LAD, VG->D1, VG->D2, VG->RCA.  . Cardiomyopathy, ischemic    a. 2012 s/p SJM 3231-40 Uni BiV ICD, ser # [id number].  . Carotid stenosis    a. 10/19/2011 carotid duplex - Mild hard plaque bilaterally.  Stable 40-59% bilateral ICA stenosis. Carotid US (04/2013):  Bilateral 40-59% ICA.  F/u 1 year  . Cerumen impaction 10/28/2015  . Chronic combined systolic and diastolic CHF (congestive heart failure) (HCC)    a. EF as low as 25% in 2006;  b. EF 60-65% in 12/2011;  c. 02/2013 Echo: EF 35-40, mod mid-dist antsept HK, Gr 2 DD, mild LVH.  . CKD (chronic kidney disease), stage III   . 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 Dyslipidemia   . Gout ~ 08/2011  . Hypercalcemia 09/26/2013  . Hypertension   . Hypothyroidism   . Hypoxemia requiring supplemental oxygen   . LBBB (left bundle branch block)   . Morbid obesity with BMI of 45.0-49.9, adult (HCC)    Ht. 5'4". BMI 47.2  . Mural thrombus of left ventricle (HCC)    before 2003, while on Coumadin, No h/o CVA  . 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 On home oxygen therapy    "normally 2-3L; 24/7" (12/14/2015)  . OSA on CPAP   . PAF (paroxysmal atrial fibrillation) (HCC)    on Coumadin  . Presence of permanent cardiac pacemaker   . 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 Vitamin D deficiency 06/09/2012   historical    ROS:   All systems reviewed and negative except as noted in the HPI.   Past Surgical History:  Procedure Laterality Date  . ABDOMINAL HYSTERECTOMY  1982  . CARDIAC CATHETERIZATION  06/03/01  . CARDIOVERSION N/A 07/30/2014   Procedure: CARDIOVERSION;  Surgeon: Destiny Artist, MD;  Location: [facility] ENDOSCOPY;  Service: Cardiovascular;  Laterality: N/A;  . CARDIOVERSION N/A 11/25/2014   Procedure: CARDIOVERSION;  Surgeon: Destiny Lance, MD;  Location: [facility];  Service: Cardiovascular;  Laterality: N/A;  . CATARACT EXTRACTION     left eye  . CHOLECYSTECTOMY  1970  . CORONARY ARTERY BYPASS GRAFT  2003   CABG X4  . INSERT / REPLACE / REMOVE PACEMAKER   2007; 2012   w/AICD  . REFRACTIVE SURGERY     right eye  . TEE WITHOUT CARDIOVERSION N/A 12/16/2015   Procedure: TRANSESOPHAGEAL ECHOCARDIOGRAM (TEE);  Surgeon: Destiny Artist, MD;  Location: [facility] ENDOSCOPY;  Service: Cardiovascular;  Laterality: N/A;  . TUBAL LIGATION  1972     Family History  Problem Relation Age of Onset  . Kidney cancer Mother     kidney and female repo - died @ 59  . Asthma Mother   . Cancer Mother     gyn and renal  . Heart disease Mother     CAD  . Heart disease Father     died @ 52  .  Stroke Father   . Diabetes Father   . Hypertension Father   . Hyperlipidemia Father   . Heart attack Father   . Hypertension Son   . Kidney cancer Maternal Uncle   . Cancer Maternal Uncle   . Cirrhosis Maternal Grandmother     non alcohol  . Cancer Maternal Grandfather   . Kidney cancer Maternal Grandfather   . Hypertension Maternal Aunt      Social History   Social History  . Marital status: Married    Spouse name: N/A  . Number of children: Y  . Years of education: N/A   Occupational History  . 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    Social History Main Topics  . Smoking status: Never Smoker  . Smokeless tobacco: Never Used  . Alcohol use No  . Drug use: No  . Sexual activity: Not on file     Comment: lives with husband, no major dietary restrictions   Other Topics Concern  . Not on file   Social History Narrative   Lives in [place] with her husband.  She does not routinely exercise or adhere to any particular diet.       BP 108/62   Pulse 73   Ht 5\' 4"  (1.626 m)   Wt 268 lb (121.6 kg)   BMI 46.00 kg/m   Physical Exam:  stable appearing morbidly obese, 75 year old woman, NAD HEENT: Unremarkable except for oxygen by nasal cannula Neck:  6 cm JVD, no thyromegally Back:  No CVA tenderness Lungs:  Clear except for minimal basilar rales. No wheezes, no rhonchi. Well-healed ICD incision. HEART:  Regular rate rhythm, no  murmurs, no rubs, no clicks Abd:  soft, positive bowel sounds, obese, no organomegally, no rebound, no guarding Ext:  2 plus pulses, no edema, no cyanosis, no clubbing Skin:  No rashes no nodules Neuro:  CN II through XII intact, motor grossly intact  ECG - NSR with AV sequential biv pacing  DEVICE  Normal device function.  See PaceArt for details. BiV pacing is 99%  Assess/Plan: 1. Chronic systolic heart failure - she appears to be euvolemic. She will continue her current meds. 2. PAF - she is maintaining NSR on low dose amiodarone 3. Morbid obesity - weight loss is encouraged 4. ICD - her St. Jude BiV ICD is working normally. Will follow.  Mikle Bosworth.D.

## 2015-12-28 NOTE — Patient Instructions (Signed)
Medication Instructions: Your physician recommends that you continue on your current medications as directed. Please refer to the Current Medication list given to you today.   Labwork: NONE ORDERED  Procedures/Testing: NONE ORDERED  Follow-Up: Your physician wants you to follow-up in 1 YEAR with Dr. Lovena Le. You will receive a reminder letter in the mail two months in advance. If you don't receive a letter, please call our office to schedule the follow-up appointment.  Remote monitoring is used to monitor your ICD from home. This monitoring reduces the number of office visits required to check your device to one time per year. It allows [place] to keep an eye on the functioning of your device to ensure it is working properly. You are scheduled for a device check from home on 03/28/16. You may send your transmission at any time that day. If you have a wireless device, the transmission will be sent automatically. After your physician reviews your transmission, you will receive a postcard with your next transmission date.    Any Additional Special Instructions Will Be Listed Below (If Applicable).     If you need a refill on your cardiac medications before your next appointment, please call your pharmacy.

## 2015-12-28 NOTE — Telephone Encounter (Signed)
Spoke w/ pt and requested that she send a manual transmission b/c her home monitor has not updated in at least 8 days.   

## 2015-12-29 ENCOUNTER — Ambulatory Visit: Payer: Self-pay

## 2016-01-01 ENCOUNTER — Emergency Department (HOSPITAL_COMMUNITY): Payer: Medicare Other

## 2016-01-01 ENCOUNTER — Inpatient Hospital Stay (HOSPITAL_COMMUNITY)
Admission: EM | Admit: 2016-01-01 | Discharge: 2016-01-09 | DRG: 189 | Disposition: A | Discharge status: Home Health | Payer: Medicare Other | Attending: Internal Medicine | Admitting: Internal Medicine

## 2016-01-01 ENCOUNTER — Encounter (HOSPITAL_COMMUNITY): Payer: Self-pay

## 2016-01-01 DIAGNOSIS — I5042 Chronic combined systolic (congestive) and diastolic (congestive) heart failure: Secondary | ICD-10-CM

## 2016-01-01 DIAGNOSIS — Z79899 Other long term (current) drug therapy: Secondary | ICD-10-CM

## 2016-01-01 DIAGNOSIS — Z7901 Long term (current) use of anticoagulants: Secondary | ICD-10-CM

## 2016-01-01 DIAGNOSIS — I130 Hypertensive heart and chronic kidney disease with heart failure and stage 1 through stage 4 chronic kidney disease, or unspecified chronic kidney disease: Secondary | ICD-10-CM | POA: Diagnosis present

## 2016-01-01 DIAGNOSIS — I454 Nonspecific intraventricular block: Secondary | ICD-10-CM | POA: Diagnosis present

## 2016-01-01 DIAGNOSIS — E039 Hypothyroidism, unspecified: Secondary | ICD-10-CM | POA: Diagnosis present

## 2016-01-01 DIAGNOSIS — E662 Morbid (severe) obesity with alveolar hypoventilation: Secondary | ICD-10-CM | POA: Diagnosis present

## 2016-01-01 DIAGNOSIS — Z9981 Dependence on supplemental oxygen: Secondary | ICD-10-CM

## 2016-01-01 DIAGNOSIS — N183 Chronic kidney disease, stage 3 (moderate): Secondary | ICD-10-CM | POA: Diagnosis present

## 2016-01-01 DIAGNOSIS — I255 Ischemic cardiomyopathy: Secondary | ICD-10-CM | POA: Diagnosis not present

## 2016-01-01 DIAGNOSIS — Z794 Long term (current) use of insulin: Secondary | ICD-10-CM

## 2016-01-01 DIAGNOSIS — I447 Left bundle-branch block, unspecified: Secondary | ICD-10-CM | POA: Diagnosis present

## 2016-01-01 DIAGNOSIS — D649 Anemia, unspecified: Secondary | ICD-10-CM | POA: Diagnosis present

## 2016-01-01 DIAGNOSIS — N179 Acute kidney failure, unspecified: Secondary | ICD-10-CM | POA: Diagnosis present

## 2016-01-01 DIAGNOSIS — R0602 Shortness of breath: Secondary | ICD-10-CM | POA: Diagnosis not present

## 2016-01-01 DIAGNOSIS — I2510 Atherosclerotic heart disease of native coronary artery without angina pectoris: Secondary | ICD-10-CM | POA: Diagnosis present

## 2016-01-01 DIAGNOSIS — Z882 Allergy status to sulfonamides status: Secondary | ICD-10-CM

## 2016-01-01 DIAGNOSIS — I4581 Long QT syndrome: Secondary | ICD-10-CM | POA: Diagnosis present

## 2016-01-01 DIAGNOSIS — E11319 Type 2 diabetes mellitus with unspecified diabetic retinopathy without macular edema: Secondary | ICD-10-CM | POA: Diagnosis present

## 2016-01-01 DIAGNOSIS — Z6841 Body Mass Index (BMI) 40.0 and over, adult: Secondary | ICD-10-CM | POA: Diagnosis not present

## 2016-01-01 DIAGNOSIS — E785 Hyperlipidemia, unspecified: Secondary | ICD-10-CM | POA: Diagnosis present

## 2016-01-01 DIAGNOSIS — G2581 Restless legs syndrome: Secondary | ICD-10-CM | POA: Diagnosis present

## 2016-01-01 DIAGNOSIS — F40240 Claustrophobia: Secondary | ICD-10-CM | POA: Diagnosis present

## 2016-01-01 DIAGNOSIS — J9620 Acute and chronic respiratory failure, unspecified whether with hypoxia or hypercapnia: Secondary | ICD-10-CM | POA: Diagnosis present

## 2016-01-01 DIAGNOSIS — J9621 Acute and chronic respiratory failure with hypoxia: Principal | ICD-10-CM | POA: Diagnosis present

## 2016-01-01 DIAGNOSIS — I340 Nonrheumatic mitral (valve) insufficiency: Secondary | ICD-10-CM | POA: Diagnosis present

## 2016-01-01 DIAGNOSIS — Z95810 Presence of automatic (implantable) cardiac defibrillator: Secondary | ICD-10-CM | POA: Diagnosis not present

## 2016-01-01 DIAGNOSIS — I5043 Acute on chronic combined systolic (congestive) and diastolic (congestive) heart failure: Secondary | ICD-10-CM | POA: Diagnosis present

## 2016-01-01 DIAGNOSIS — E1122 Type 2 diabetes mellitus with diabetic chronic kidney disease: Secondary | ICD-10-CM | POA: Diagnosis present

## 2016-01-01 DIAGNOSIS — Z951 Presence of aortocoronary bypass graft: Secondary | ICD-10-CM | POA: Diagnosis not present

## 2016-01-01 DIAGNOSIS — J9622 Acute and chronic respiratory failure with hypercapnia: Secondary | ICD-10-CM | POA: Diagnosis not present

## 2016-01-01 DIAGNOSIS — I480 Paroxysmal atrial fibrillation: Secondary | ICD-10-CM | POA: Diagnosis present

## 2016-01-01 DIAGNOSIS — G9340 Encephalopathy, unspecified: Secondary | ICD-10-CM | POA: Diagnosis present

## 2016-01-01 DIAGNOSIS — G4733 Obstructive sleep apnea (adult) (pediatric): Secondary | ICD-10-CM | POA: Diagnosis present

## 2016-01-01 DIAGNOSIS — I10 Essential (primary) hypertension: Secondary | ICD-10-CM | POA: Diagnosis present

## 2016-01-01 DIAGNOSIS — J9602 Acute respiratory failure with hypercapnia: Secondary | ICD-10-CM | POA: Diagnosis not present

## 2016-01-01 DIAGNOSIS — Z90710 Acquired absence of both cervix and uterus: Secondary | ICD-10-CM

## 2016-01-01 DIAGNOSIS — J9601 Acute respiratory failure with hypoxia: Secondary | ICD-10-CM | POA: Diagnosis not present

## 2016-01-01 LAB — CBC WITH DIFFERENTIAL/PLATELET
Basophils Absolute: 0 10*3/uL (ref 0.0–0.1)
Basophils Relative: 0 %
Eosinophils Absolute: 0.2 10*3/uL (ref 0.0–0.7)
Eosinophils Relative: 3 %
HCT: 32 % — ABNORMAL LOW (ref 36.0–46.0)
Hemoglobin: 9.4 g/dL — ABNORMAL LOW (ref 12.0–15.0)
Lymphocytes Relative: 17 %
Lymphs Abs: 1.2 10*3/uL (ref 0.7–4.0)
MCH: 29.9 pg (ref 26.0–34.0)
MCHC: 29.4 g/dL — ABNORMAL LOW (ref 30.0–36.0)
MCV: 101.9 fL — ABNORMAL HIGH (ref 78.0–100.0)
Monocytes Absolute: 1.1 10*3/uL — ABNORMAL HIGH (ref 0.1–1.0)
Monocytes Relative: 16 %
Neutro Abs: 4.4 10*3/uL (ref 1.7–7.7)
Neutrophils Relative %: 64 %
Platelets: 162 10*3/uL (ref 150–400)
RBC: 3.14 MIL/uL — ABNORMAL LOW (ref 3.87–5.11)
RDW: 16.5 % — ABNORMAL HIGH (ref 11.5–15.5)
WBC: 6.9 10*3/uL (ref 4.0–10.5)

## 2016-01-01 LAB — COMPREHENSIVE METABOLIC PANEL
ALT: 25 U/L (ref 14–54)
AST: 29 U/L (ref 15–41)
Albumin: 3.4 g/dL — ABNORMAL LOW (ref 3.5–5.0)
Alkaline Phosphatase: 37 U/L — ABNORMAL LOW (ref 38–126)
Anion gap: 9 (ref 5–15)
BUN: 56 mg/dL — ABNORMAL HIGH (ref 6–20)
CO2: 34 mmol/L — ABNORMAL HIGH (ref 22–32)
Calcium: 8.9 mg/dL (ref 8.9–10.3)
Chloride: 94 mmol/L — ABNORMAL LOW (ref 101–111)
Creatinine, Ser: 2.91 mg/dL — ABNORMAL HIGH (ref 0.44–1.00)
GFR calc Af Amer: 17 mL/min — ABNORMAL LOW (ref >60)
GFR calc non Af Amer: 15 mL/min — ABNORMAL LOW (ref >60)
Glucose, Bld: 128 mg/dL — ABNORMAL HIGH (ref 65–99)
Potassium: 4.6 mmol/L (ref 3.5–5.1)
Sodium: 137 mmol/L (ref 135–145)
Total Bilirubin: 0.8 mg/dL (ref 0.3–1.2)
Total Protein: 6.3 g/dL — ABNORMAL LOW (ref 6.5–8.1)

## 2016-01-01 LAB — I-STAT TROPONIN, ED: Troponin i, poc: 0.03 ng/mL (ref 0.00–0.08)

## 2016-01-01 LAB — MRSA PCR SCREENING: MRSA BY PCR: NEGATIVE

## 2016-01-01 LAB — I-STAT ARTERIAL BLOOD GAS, ED
ACID-BASE EXCESS: 12 mmol/L — AB (ref 0.0–2.0)
Acid-Base Excess: 10 mmol/L — ABNORMAL HIGH (ref 0.0–2.0)
Bicarbonate: 38.5 mEq/L — ABNORMAL HIGH (ref 20.0–24.0)
Bicarbonate: 40.3 mEq/L — ABNORMAL HIGH (ref 20.0–24.0)
O2 SAT: 85 %
O2 SAT: 99 %
PCO2 ART: 73.7 mmHg — AB (ref 35.0–45.0)
PH ART: 7.326 — AB (ref 7.350–7.450)
PH ART: 7.329 — AB (ref 7.350–7.450)
Patient temperature: 98.6
TCO2: 41 mmol/L (ref 0–100)
TCO2: 43 mmol/L (ref 0–100)
pCO2 arterial: 76.4 mmHg (ref 35.0–45.0)
pO2, Arterial: 57 mmHg — ABNORMAL LOW (ref 80.0–100.0)
pO2, Arterial: 131 mmHg — ABNORMAL HIGH (ref 80.0–100.0)

## 2016-01-01 LAB — PROTIME-INR
INR: 3.06
Prothrombin Time: 32.3 seconds — ABNORMAL HIGH (ref 11.4–15.2)

## 2016-01-01 LAB — GLUCOSE, CAPILLARY
GLUCOSE-CAPILLARY: 109 mg/dL — AB (ref 65–99)
Glucose-Capillary: 80 mg/dL (ref 65–99)
Glucose-Capillary: 62 mg/dL — ABNORMAL LOW (ref 65–99)

## 2016-01-01 LAB — BRAIN NATRIURETIC PEPTIDE: B Natriuretic Peptide: 829.2 pg/mL — ABNORMAL HIGH (ref 0.0–100.0)

## 2016-01-01 MED ORDER — FLUTICASONE PROPIONATE 50 MCG/ACT NA SUSP
2.0000 | Freq: Every day | NASAL | Status: DC | PRN
  Filled 2016-01-01: qty 16

## 2016-01-01 MED ORDER — SODIUM CHLORIDE 0.9% FLUSH
3.0000 mL | INTRAVENOUS | Status: DC | PRN
  Administered 2016-01-08: 3 mL via INTRAVENOUS
  Filled 2016-01-01: qty 3

## 2016-01-01 MED ORDER — INSULIN ASPART 100 UNIT/ML ~~LOC~~ SOLN: 0.0000 [IU] | SUBCUTANEOUS | Status: DC

## 2016-01-01 MED ORDER — DEXTROSE 50 % IV SOLN
INTRAVENOUS | Status: AC
  Administered 2016-01-01: 25 mL
  Filled 2016-01-01: qty 50

## 2016-01-01 MED ORDER — METOPROLOL TARTRATE 5 MG/5ML IV SOLN
5.0000 mg | Freq: Four times a day (QID) | INTRAVENOUS | Status: DC
  Administered 2016-01-02 – 2016-01-03 (×6): 5 mg via INTRAVENOUS
  Filled 2016-01-01 (×7): qty 5

## 2016-01-01 MED ORDER — FUROSEMIDE 10 MG/ML IJ SOLN
40.0000 mg | Freq: Once | INTRAMUSCULAR | Status: AC
  Administered 2016-01-01: 40 mg via INTRAVENOUS
  Filled 2016-01-01: qty 4

## 2016-01-01 MED ORDER — SODIUM CHLORIDE 0.9 % IV SOLN: 250.0000 mL | INTRAVENOUS | Status: DC | PRN

## 2016-01-01 MED ORDER — FUROSEMIDE 10 MG/ML IJ SOLN
60.0000 mg | Freq: Once | INTRAMUSCULAR | Status: AC
  Administered 2016-01-01: 60 mg via INTRAVENOUS
  Filled 2016-01-01: qty 6

## 2016-01-01 MED ORDER — WARFARIN - PHARMACIST DOSING INPATIENT
Freq: Every day | Status: DC
  Administered 2016-01-02: 18:00:00
  Administered 2016-01-06: 1

## 2016-01-01 MED ORDER — LEVOTHYROXINE SODIUM 100 MCG IV SOLR
12.5000 ug | Freq: Every day | INTRAVENOUS | Status: DC
  Administered 2016-01-02: 12.5 ug via INTRAVENOUS
  Filled 2016-01-01 (×2): qty 5

## 2016-01-01 MED ORDER — ALBUTEROL SULFATE (2.5 MG/3ML) 0.083% IN NEBU: 3.0000 mL | INHALATION_SOLUTION | Freq: Four times a day (QID) | RESPIRATORY_TRACT | Status: DC | PRN

## 2016-01-01 MED ORDER — ACETAMINOPHEN 325 MG PO TABS: 650.0000 mg | ORAL_TABLET | ORAL | Status: DC | PRN

## 2016-01-01 MED ORDER — WARFARIN SODIUM 1 MG PO TABS
1.0000 mg | ORAL_TABLET | Freq: Once | ORAL | Status: AC
  Administered 2016-01-01: 1 mg via ORAL
  Filled 2016-01-01: qty 1

## 2016-01-01 MED ORDER — SODIUM CHLORIDE 0.9% FLUSH
3.0000 mL | Freq: Two times a day (BID) | INTRAVENOUS | Status: DC
  Administered 2016-01-01 – 2016-01-09 (×14): 3 mL via INTRAVENOUS

## 2016-01-01 MED ORDER — ONDANSETRON HCL 4 MG/2ML IJ SOLN
4.0000 mg | Freq: Four times a day (QID) | INTRAMUSCULAR | Status: DC | PRN
  Administered 2016-01-02: 4 mg via INTRAVENOUS
  Filled 2016-01-01: qty 2

## 2016-01-01 MED ORDER — ALBUTEROL SULFATE (2.5 MG/3ML) 0.083% IN NEBU
5.0000 mg | INHALATION_SOLUTION | Freq: Once | RESPIRATORY_TRACT | Status: AC
  Administered 2016-01-01: 5 mg via RESPIRATORY_TRACT
  Filled 2016-01-01: qty 6

## 2016-01-01 NOTE — ED Notes (Signed)
Pt was brought back by tech from triage, pt was pale/diaphoretic, SOB. Pt placed on 12 lead, IV started, Kohut MD at bedside. Pt was 79% on 3L [place], place on NRB and sats inc to 100%. Kohut MD placed pt back on 3L [place]. O2 sats remain above 90%, pt went to xray. Stable

## 2016-01-01 NOTE — Progress Notes (Signed)
ANTICOAGULATION CONSULT NOTE - Initial Consult  Pharmacy Consult for Warfarin Indication: atrial fibrillation  Allergies  Allergen Reactions  . Sulfonamide Derivatives Other (See Comments)    Unknown; "childhood allergy/mother"    Patient Measurements: Height: 5\' 4"  (162.6 cm) Weight: 268 lb (121.6 kg) IBW/kg (Calculated) : 54.7  Vital Signs: Temp: 98.3 F (36.8 C) (08/20 1038) Temp Source: Oral (08/20 1038) BP: 120/78 (08/20 1345) Pulse Rate: 69 (08/20 1345)  Labs:  Recent Labs  01/01/16 1110  HGB 9.4*  HCT 32.0*  PLT 162  LABPROT 32.3*  INR 3.06  CREATININE 2.91*    Estimated Creatinine Clearance: 22.2 mL/min (by C-G formula based on SCr of 2.91 mg/dL).   Medical History: Past Medical History:  Diagnosis Date  . AICD (automatic cardioverter/defibrillator) present   . Anemia, unspecified 12/13/2013  . Back pain, chronic    "just when I walk; mass on 3rd and 4th vertebrae right lower back"  . Biventricular implantable cardiac defibrillator in situ 2007, 2012   a. 2007;  b. 2012 Gen change: SJM 3231-40 Uni BiV ICD, ser # [id number].  Marland Kitchen CAD (coronary artery disease)    a. 05/2001 CABG x4: LIMA->LAD, VG->D1, VG->D2, VG->RCA.  . Cardiomyopathy, ischemic    a. 2012 s/p SJM 3231-40 Uni BiV ICD, ser # [id number].  . Carotid stenosis    a. 10/19/2011 carotid duplex - Mild hard plaque bilaterally. Stable 40-59% bilateral ICA stenosis. Carotid US (04/2013):  Bilateral 40-59% ICA.  F/u 1 year  . Cerumen impaction 10/28/2015  . Chronic combined systolic and diastolic CHF (congestive heart failure) (HCC)    a. EF as low as 25% in 2006;  b. EF 60-65% in 12/2011;  c. 02/2013 Echo: EF 35-40, mod mid-dist antsept HK, Gr 2 DD, mild LVH.  . CKD (chronic kidney disease), stage III   . 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 Dyslipidemia   . Gout ~ 08/2011  . Hypercalcemia 09/26/2013  . Hypertension   . Hypothyroidism   .  Hypoxemia requiring supplemental oxygen   . LBBB (left bundle branch block)   . Morbid obesity with BMI of 45.0-49.9, adult (HCC)    Ht. 5'4". BMI 47.2  . Mural thrombus of left ventricle (HCC)    before 2003, while on Coumadin, No h/o CVA  . 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 Benay Spice, in remission  . On home oxygen therapy    "normally 2-3L; 24/7" (12/14/2015)  . OSA on CPAP   . PAF (paroxysmal atrial fibrillation) (HCC)    on Coumadin  . Presence of permanent cardiac pacemaker   . 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 Vitamin D deficiency 06/09/2012   historical    Medications:  See med rec - Coumadin 2.5mg  daily except 5mg  on Sunday and Thursday.  Assessment: 75 yo F presented to ED with increased BOB and confusion x 3 days.  Pt with recent hospitalization 8/2 > 8/5 for dehydration thought secondary to over diuresis.  PMH: PAF, HF, ICM, DM, HTN, hypothyroidism, OSA  Last clinic note shows INR 3.2 on 8/16 with instructions to take Coumadin 2.5mg  daily except 5mg  on Sunday and Thursday.  Goal of Therapy:  INR 2-3 Monitor platelets by anticoagulation protocol: Yes   Plan:  Coumadin 1mg  PO x 1 tonight - reduced dose due to slightly elevated INR Daily INR   [employer],  Pharm.D., BCPS Clinical Pharmacist Pager [phone] 01/01/2016 2:06 PM

## 2016-01-01 NOTE — ED Notes (Signed)
Hospitalist at bedside 

## 2016-01-01 NOTE — Progress Notes (Signed)
Patient stated her breathing felt better with no c/o being short of breath. Removed bipap and placed on home regimen oxygen set at 3lpm.

## 2016-01-01 NOTE — ED Provider Notes (Signed)
[facility] DEPT Provider Note   CSN: [id number] Arrival date & time: 01/01/16  1028  By signing my name below, I, Destiny [name], attest that this documentation has been prepared under the direction and in the presence of Destiny Manifold, MD. Electronically Signed: Hansel [name], ED Scribe. 01/01/16. 11:04 AM.     History   Chief Complaint Chief Complaint  Patient presents with  . Shortness of Breath  . Altered Mental Status    Destiny [name] is a 75 y.o. female with h/o CHF, CAD, CKDIII, DM2, PAF, AICD placement who presents to the Emergency Department complaining of gradually worsening SOB for a week with associated generalized weakness. Husband also notes increased confusion for the last 2 days, decreased urine volume and increased leg swelling from baseline. Per husband, he has had to increase her home O2 from 2-3 L to 4 L in order to keep her spO2 above 90% for the last 3 days. Husband states he has not been able to put the pt on CPAP for the last few nights because he has not been able to maintain her spO2 on the machine. Per husband, the pt has been occasionally speaking nonsensical sentences and fidgeting. Pt states her breathing feels labored, she feels slightly confused and generally fatigued. Pt was recently hospitalized for similar complaints on 12/14/15 for 3 days with dx of acute encephalopathy. Pt denies cough, CP, abdominal pain.   The history is provided by the patient. No language interpreter was used.    Past Medical History:  Diagnosis Date  . AICD (automatic cardioverter/defibrillator) present   . Anemia, unspecified 12/13/2013  . Back pain, chronic    "just when I walk; mass on 3rd and 4th vertebrae right lower back"  . Biventricular implantable cardiac defibrillator in situ 2007, 2012   a. 2007;  b. 2012 Gen change: SJM 3231-40 Uni BiV ICD, ser # [id number].  Marland Kitchen CAD (coronary artery disease)    a. 05/2001 CABG x4: LIMA->LAD, VG->D1, VG->D2, VG->RCA.  . Cardiomyopathy,  ischemic    a. 2012 s/p SJM 3231-40 Uni BiV ICD, ser # [id number].  . Carotid stenosis    a. 10/19/2011 carotid duplex - Mild hard plaque bilaterally. Stable 40-59% bilateral ICA stenosis. Carotid US (04/2013):  Bilateral 40-59% ICA.  F/u 1 year  . Cerumen impaction 10/28/2015  . Chronic combined systolic and diastolic CHF (congestive heart failure) (HCC)    a. EF as low as 25% in 2006;  b. EF 60-65% in 12/2011;  c. 02/2013 Echo: EF 35-40, mod mid-dist antsept HK, Gr 2 DD, mild LVH.  . CKD (chronic kidney disease), stage III   . 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 Dyslipidemia   . Gout ~ 08/2011  . Hypercalcemia 09/26/2013  . Hypertension   . Hypothyroidism   . Hypoxemia requiring supplemental oxygen   . LBBB (left bundle branch block)   . Morbid obesity with BMI of 45.0-49.9, adult (HCC)    Ht. 5'4". BMI 47.2  . Mural thrombus of left ventricle (HCC)    before 2003, while on Coumadin, No h/o CVA  . 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 Benay Spice, in remission  . On home oxygen therapy    "normally 2-3L; 24/7" (12/14/2015)  . OSA on CPAP   . PAF (paroxysmal atrial fibrillation) (HCC)    on Coumadin  . Presence of permanent cardiac pacemaker   . 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 Vitamin D deficiency 06/09/2012   historical    Patient Active Problem List   Diagnosis Date Noted  . Abdominal distention   . Debility 12/14/2015  . Acute encephalopathy 12/14/2015  . Cerumen impaction 10/28/2015  . Hypoglycemia 10/25/2014  . Medicare annual wellness visit, subsequent 09/12/2014  . Coagulopathy ([place]) 07/29/2014  . Morbid obesity ([place]) 07/29/2014  . Type 2 diabetes mellitus, controlled, with renal complications ([place]) 123456  . Chronic combined systolic and diastolic CHF (congestive heart failure) ([place]) 07/28/2014  . Anemia 12/13/2013  . DOE  (dyspnea on exertion) 11/30/2013  . Edema 09/26/2013  . Hypercalcemia 09/26/2013  . Encounter for therapeutic drug monitoring 06/10/2013  . Chronic systolic heart failure ([place]) 05/01/2013  . Chronic combined systolic and diastolic heart failure ([place]) 05/01/2013  . Physical deconditioning 03/25/2013  . Hyperkalemia 03/12/2013  . Hypothyroidism 03/06/2013  . Vitamin D deficiency 06/09/2012  . History of Cardiomyopathy, ischemic   . OSA (obstructive sleep apnea) 09/05/2010  . LEG PAIN, BILATERAL 12/28/2009  . Diabetes mellitus type 2 with retinopathy ([place]) 10/28/2009  . ATRIAL FIBRILLATION, PAROXYSMAL 06/21/2009  . NHL (non-Hodgkin's lymphoma) ([place]) 04/21/2009  . MURAL THROMBUS, LEFT VENTRICLE 03/07/2009  . LYMPHEDEMA 03/07/2009  . LBBB 08/26/2008  . CVA 08/26/2008  . BACK PAIN, CHRONIC 08/26/2008  . Automatic implantable cardioverter-defibrillator in situ 08/26/2008  . UTI'S, RECURRENT 06/24/2008  . Carotid stenosis 05/20/2007  . Hyperlipidemia 03/19/2007  . Essential hypertension 11/20/2006    Past Surgical History:  Procedure Laterality Date  . ABDOMINAL HYSTERECTOMY  1982  . CARDIAC CATHETERIZATION  06/03/01  . CARDIOVERSION N/A 07/30/2014   Procedure: CARDIOVERSION;  Surgeon: Jolaine Artist, MD;  Location[facility] ENDOSCOPY;  Service: Cardiovascular;  Laterality: N/A;  . CARDIOVERSION N/A 11/25/2014   Procedure: CARDIOVERSION;  Surgeon: Evans Lance, MD;  Location: [facility];  Service: Cardiovascular;  Laterality: N/A;  . CATARACT EXTRACTION     left eye  . CHOLECYSTECTOMY  1970  . CORONARY ARTERY BYPASS GRAFT  2003   CABG X4  . INSERT / REPLACE / REMOVE PACEMAKER  2007; 2012   w/AICD  . REFRACTIVE SURGERY     right eye  . TEE WITHOUT CARDIOVERSION N/A 12/16/2015   Procedure: TRANSESOPHAGEAL ECHOCARDIOGRAM (TEE);  Surgeon: Jolaine Artist, MD;  Location: [facility] ENDOSCOPY;  Service: Cardiovascular;  Laterality: N/A;  . TUBAL LIGATION  1972    OB History    No data  available       Home Medications    Prior to Admission medications   Medication Sig Start Date End Date Taking? Authorizing Provider  acetaminophen (TYLENOL) 500 MG tablet Take 1,000 mg by mouth 2 (two) times daily as needed for mild pain.     Historical Provider, MD  albuterol (PROVENTIL HFA;VENTOLIN HFA) 108 (90 BASE) MCG/ACT inhaler Inhale 2 puffs into the lungs every 6 (six) hours as needed for wheezing or shortness of breath. 12/26/12   Mosie Lukes, MD  amiodarone (PACERONE) 200 MG tablet Take 200 mg by mouth daily.    Historical Provider, MD  atorvastatin (LIPITOR) 20 MG tablet TAKE 1 TABLET EVERY DAY 10/31/15   Mosie Lukes, MD  carvedilol (COREG) 25 MG tablet TAKE 2 TABLETS TWICE DAILY 08/30/15   Jolaine Artist, MD  chlorpheniramine (CHLOR-TRIMETON) 4 MG tablet Take 4 mg by mouth 2 (two) times daily.    Historical Provider, MD  escitalopram (LEXAPRO) 10 MG tablet TAKE  1 TABLET EVERY DAY 11/29/15   Mosie Lukes, MD  fenofibrate (TRICOR) 48 MG tablet TAKE 1 TABLET EVERY DAY 11/16/15   Jolaine Artist, MD  fluticasone [facility]) 50 MCG/ACT nasal spray Place 2 sprays into both nostrils daily as needed for allergies or rhinitis. As needed for nasal stuffiness. 01/13/14   Mosie Lukes, MD  gabapentin (NEURONTIN) 300 MG capsule Take 300 mg by mouth at bedtime.    Historical Provider, MD  glucose blood (FREESTYLE LITE) test strip DX: 250.60  Check sugars bid and as needed 05/22/13   Mosie Lukes, MD  insulin NPH (HUMULIN N,NOVOLIN N) 100 UNIT/ML injection Inject 15-30 Units into the skin 2 (two) times daily before a meal. Take 20-30 units in the morning and 10-15 units in the evening    Historical Provider, MD  insulin regular (NOVOLIN R,HUMULIN R) 100 units/mL injection Change to 15 units three times a with meals (to cover all meals--this is the same total amount but spread out) Patient taking differently: Inject 10-15 Units into the skin 2 (two) times daily before a meal. Change to  15 units three times a with meals (to cover all meals--this is the same total amount but spread out) 04/01/13   Ivan Anchors Love, PA-C  levothyroxine (SYNTHROID, LEVOTHROID) 25 MCG tablet TAKE 1 TABLET EVERY DAY BEFORE BREAKFAST 11/29/15   Mosie Lukes, MD  LORazepam (ATIVAN) 0.5 MG tablet Take 1 tablet (0.5 mg total) by mouth at bedtime. 10/03/15   Mosie Lukes, MD  OXYGEN Inhale 3 L into the lungs continuous.    Historical Provider, MD  rOPINIRole (REQUIP) 0.25 MG tablet Take 1 tablet (0.25 mg total) by mouth at bedtime. 12/17/15   Reyne Dumas, MD  torsemide (DEMADEX) 20 MG tablet Take 2 tablets (40 mg total) by mouth daily. 12/17/15   Reyne Dumas, MD  Vitamin D, Ergocalciferol, (DRISDOL) 50000 UNITS CAPS capsule Take 50,000 Units by mouth every Tuesday.     Historical Provider, MD  warfarin (COUMADIN) 5 MG tablet TAKE AS DIRECTED BY ANTICOAGULATION CLINIC. Patient taking differently: TAKES 1 TAB (5 mg) ON SUN AND THURS, TAKES 1/2 TAB (2.5 mg) ALL OTHER DAYS 06/30/15   Evans Lance, MD    Family History Family History  Problem Relation Age of Onset  . Kidney cancer Mother     kidney and female repo - died @ 24  . Asthma Mother   . Cancer Mother     gyn and renal  . Heart disease Mother     CAD  . Heart disease Father     died @ 52  . Stroke Father   . Diabetes Father   . Hypertension Father   . Hyperlipidemia Father   . Heart attack Father   . Hypertension Son   . Kidney cancer Maternal Uncle   . Cancer Maternal Uncle   . Cirrhosis Maternal Grandmother     non alcohol  . Cancer Maternal Grandfather   . Kidney cancer Maternal Grandfather   . Hypertension Maternal Aunt     Social History Social History  Substance Use Topics  . Smoking status: Never Smoker  . Smokeless tobacco: Never Used  . Alcohol use No     Allergies   Sulfonamide derivatives   Review of Systems Review of Systems  Constitutional: Positive for fatigue.  Respiratory: Positive for shortness of  breath. Negative for cough.   Cardiovascular: Negative for chest pain and leg swelling.  Gastrointestinal: Negative for abdominal  pain.  Genitourinary: Positive for decreased urine volume.  Neurological: Positive for weakness.  Psychiatric/Behavioral: Positive for confusion.  All other systems reviewed and are negative.    Physical Exam Updated Vital Signs BP (!) 116/53   Temp 98.3 F (36.8 C) (Oral)   Resp 22   Ht 5\' 4"  (1.626 m)   Wt 268 lb (121.6 kg)   SpO2 (!) 87%   BMI 46.00 kg/m   Physical Exam  Constitutional: She appears well-developed and well-nourished. No distress.  Morbidly obese.   HENT:  Head: Normocephalic and atraumatic.  Mouth/Throat: Oropharynx is clear and moist. No oropharyngeal exudate.  Eyes: Conjunctivae and EOM are normal. Pupils are equal, round, and reactive to light. Right eye exhibits no discharge. Left eye exhibits no discharge. No scleral icterus.  Neck: Normal range of motion. Neck supple. No JVD present. No thyromegaly present.  Cardiovascular: Normal rate, regular rhythm, normal heart sounds and intact distal pulses.  Exam reveals no gallop and no friction rub.   No murmur heard. Pulmonary/Chest: No respiratory distress. She has no wheezes. She has no rales.  Tachypnic, belly breathing. Breath sounds decreased bilaterally. Prolonged expiratory phase.   Abdominal: Soft. Bowel sounds are normal. She exhibits no distension and no mass. There is no tenderness.  Musculoskeletal: Normal range of motion. She exhibits no edema or tenderness.  Lymphadenopathy:    She has no cervical adenopathy.  Neurological: She is alert. Coordination normal.  Skin: Skin is warm and dry. No rash noted. No erythema.  Psychiatric: She has a normal mood and affect. Her behavior is normal.  Nursing note and vitals reviewed.    ED Treatments / Results  Labs (all labs ordered are listed, but only abnormal results are displayed) Labs Reviewed  BRAIN NATRIURETIC  PEPTIDE - Abnormal; Notable for the following:       Result Value   B Natriuretic Peptide 829.2 (*)    All other components within normal limits  CBC WITH DIFFERENTIAL/PLATELET - Abnormal; Notable for the following:    RBC 3.14 (*)    Hemoglobin 9.4 (*)    HCT 32.0 (*)    MCV 101.9 (*)    MCHC 29.4 (*)    RDW 16.5 (*)    Monocytes Absolute 1.1 (*)    All other components within normal limits  COMPREHENSIVE METABOLIC PANEL - Abnormal; Notable for the following:    Chloride 94 (*)    CO2 34 (*)    Glucose, Bld 128 (*)    BUN 56 (*)    Creatinine, Ser 2.91 (*)    Total Protein 6.3 (*)    Albumin 3.4 (*)    Alkaline Phosphatase 37 (*)    GFR calc non Af Amer 15 (*)    GFR calc Af Amer 17 (*)    All other components within normal limits  PROTIME-INR - Abnormal; Notable for the following:    Prothrombin Time 32.3 (*)    All other components within normal limits  BASIC METABOLIC PANEL - Abnormal; Notable for the following:    Chloride 94 (*)    CO2 36 (*)    BUN 55 (*)    Creatinine, Ser 2.83 (*)    GFR calc non Af Amer 15 (*)    GFR calc Af Amer 18 (*)    All other components within normal limits  PROTIME-INR - Abnormal; Notable for the following:    Prothrombin Time 33.0 (*)    All other components within normal limits  GLUCOSE,  CAPILLARY - Abnormal; Notable for the following:    Glucose-Capillary 62 (*)    All other components within normal limits  GLUCOSE, CAPILLARY - Abnormal; Notable for the following:    Glucose-Capillary 109 (*)    All other components within normal limits  GLUCOSE, CAPILLARY - Abnormal; Notable for the following:    Glucose-Capillary 155 (*)    All other components within normal limits  GLUCOSE, CAPILLARY - Abnormal; Notable for the following:    Glucose-Capillary 128 (*)    All other components within normal limits  GLUCOSE, CAPILLARY - Abnormal; Notable for the following:    Glucose-Capillary 139 (*)    All other components within normal  limits  BASIC METABOLIC PANEL - Abnormal; Notable for the following:    Chloride 91 (*)    CO2 40 (*)    Glucose, Bld 137 (*)    BUN 52 (*)    Creatinine, Ser 2.47 (*)    GFR calc non Af Amer 18 (*)    GFR calc Af Amer 21 (*)    All other components within normal limits  PROTIME-INR - Abnormal; Notable for the following:    Prothrombin Time 36.1 (*)    All other components within normal limits  CBC - Abnormal; Notable for the following:    RBC 3.25 (*)    Hemoglobin 9.5 (*)    HCT 32.6 (*)    MCV 100.3 (*)    MCHC 29.1 (*)    RDW 16.3 (*)    All other components within normal limits  GLUCOSE, CAPILLARY - Abnormal; Notable for the following:    Glucose-Capillary 181 (*)    All other components within normal limits  GLUCOSE, CAPILLARY - Abnormal; Notable for the following:    Glucose-Capillary 150 (*)    All other components within normal limits  GLUCOSE, CAPILLARY - Abnormal; Notable for the following:    Glucose-Capillary 140 (*)    All other components within normal limits  GLUCOSE, CAPILLARY - Abnormal; Notable for the following:    Glucose-Capillary 139 (*)    All other components within normal limits  GLUCOSE, CAPILLARY - Abnormal; Notable for the following:    Glucose-Capillary 149 (*)    All other components within normal limits  GLUCOSE, CAPILLARY - Abnormal; Notable for the following:    Glucose-Capillary 208 (*)    All other components within normal limits  BASIC METABOLIC PANEL - Abnormal; Notable for the following:    Chloride 89 (*)    CO2 39 (*)    Glucose, Bld 121 (*)    BUN 60 (*)    Creatinine, Ser 2.58 (*)    Calcium 8.7 (*)    GFR calc non Af Amer 17 (*)    GFR calc Af Amer 20 (*)    All other components within normal limits  PROTIME-INR - Abnormal; Notable for the following:    Prothrombin Time 38.3 (*)    All other components within normal limits  CBC - Abnormal; Notable for the following:    RBC 3.00 (*)    Hemoglobin 8.6 (*)    HCT 29.6 (*)     MCHC 29.1 (*)    RDW 16.3 (*)    All other components within normal limits  GLUCOSE, CAPILLARY - Abnormal; Notable for the following:    Glucose-Capillary 334 (*)    All other components within normal limits  GLUCOSE, CAPILLARY - Abnormal; Notable for the following:    Glucose-Capillary 193 (*)  All other components within normal limits  GLUCOSE, CAPILLARY - Abnormal; Notable for the following:    Glucose-Capillary 132 (*)    All other components within normal limits  PROTIME-INR - Abnormal; Notable for the following:    Prothrombin Time 29.9 (*)    All other components within normal limits  COMPREHENSIVE METABOLIC PANEL - Abnormal; Notable for the following:    Sodium 134 (*)    Chloride 91 (*)    CO2 36 (*)    Glucose, Bld 127 (*)    BUN 64 (*)    Creatinine, Ser 2.46 (*)    Calcium 8.5 (*)    Total Protein 5.9 (*)    Albumin 3.0 (*)    GFR calc non Af Amer 18 (*)    GFR calc Af Amer 21 (*)    All other components within normal limits  CBC - Abnormal; Notable for the following:    RBC 2.97 (*)    Hemoglobin 8.9 (*)    HCT 29.0 (*)    RDW 16.2 (*)    All other components within normal limits  GLUCOSE, CAPILLARY - Abnormal; Notable for the following:    Glucose-Capillary 203 (*)    All other components within normal limits  GLUCOSE, CAPILLARY - Abnormal; Notable for the following:    Glucose-Capillary 209 (*)    All other components within normal limits  GLUCOSE, CAPILLARY - Abnormal; Notable for the following:    Glucose-Capillary 118 (*)    All other components within normal limits  GLUCOSE, CAPILLARY - Abnormal; Notable for the following:    Glucose-Capillary 200 (*)    All other components within normal limits  I-STAT ARTERIAL BLOOD GAS, ED - Abnormal; Notable for the following:    pH, Arterial 7.326 (*)    pCO2 arterial 73.7 (*)    pO2, Arterial 57.0 (*)    Bicarbonate 38.5 (*)    Acid-Base Excess 10.0 (*)    All other components within normal limits    I-STAT ARTERIAL BLOOD GAS, ED - Abnormal; Notable for the following:    pH, Arterial 7.329 (*)    pCO2 arterial 76.4 (*)    pO2, Arterial 131.0 (*)    Bicarbonate 40.3 (*)    Acid-Base Excess 12.0 (*)    All other components within normal limits  MRSA PCR SCREENING  GLUCOSE, CAPILLARY  GLUCOSE, CAPILLARY  GLUCOSE, CAPILLARY  GLUCOSE, CAPILLARY  MAGNESIUM  PHOSPHORUS  I-STAT TROPOININ, ED    EKG  EKG Interpretation  Date/Time:  Sunday January 01 2016 10:37:27 EDT Ventricular Rate:  70 PR Interval:    QRS Duration: 178 QT Interval:  470 QTC Calculation: 507 R Axis:   -144 Text Interpretation:  AV dual-paced rhythm Abnormal ECG Confirmed by Wilson Singer  MD, [place]alah Asberry ([id number]) on 01/01/2016 11:34:37 AM       Radiology No results found.   Dg Chest 2 View  Result Date: 01/01/2016 CLINICAL DATA:  Shortness of breath, congestive heart failure EXAM: CHEST  2 VIEW COMPARISON:  12/14/2015 FINDINGS: Cardiomegaly again noted. Status post CABG. Three leads cardiac pacemaker is unchanged in position. Central vascular congestion and mild interstitial prominence bilaterally suspicious for mild interstitial edema. There is streaky left basilar atelectasis or infiltrate. Osteopenia and degenerative changes thoracic spine. IMPRESSION: Cardiomegaly. Status post CABG. Central mild vascular congestion mild interstitial prominence bilaterally suspicious for mild interstitial edema. Streaky left basilar atelectasis or infiltrate. Electronically Signed   By: Lahoma Crocker M.D.   On: 01/01/2016 11:26  Dg Chest 2 View  Result Date: 12/14/2015 CLINICAL DATA:  Not being able to sleep for 3 days due to tremors, shaking, thick tongued, altered mental status, fatigue, history hypertension, chronic combined systolic and diastolic CHF, ischemic cardiomyopathy, coronary artery disease EXAM: CHEST  2 VIEW COMPARISON:  12/05/2015 FINDINGS: LEFT subclavian transvenous pacemaker/AICD leads project at RIGHT atrium, RIGHT  ventricle and coronary sinus. Enlargement of cardiac silhouette post CABG. Pulmonary vascular congestion. Mediastinal contour stable. Slightly improved pulmonary edema since previous exam. No segmental consolidation, pleural effusion or pneumothorax. Bones demineralized. IMPRESSION: Enlargement cardiac silhouette post CABG and AICD with pulmonary vascular congestion and slightly improved pulmonary edema. Electronically Signed   By: Lavonia Dana M.D.   On: 12/14/2015 13:31   Dg Abd 1 View  Result Date: 12/14/2015 CLINICAL DATA:  Abdominal distension. EXAM: ABDOMEN - 1 VIEW COMPARISON:  Radiographs of March 19, 2013. FINDINGS: The bowel gas pattern is normal. No radio-opaque calculi or other significant radiographic abnormality are seen. IMPRESSION: No evidence of bowel obstruction or ileus. Electronically Signed   By: Marijo Conception, M.D.   On: 12/14/2015 21:51   Ct Head Wo Contrast  Result Date: 12/14/2015 CLINICAL DATA:  Acute encephalopathy. Worsening confusion, fatigue, recurrent tremulousness and jerking movements. EXAM: CT HEAD WITHOUT CONTRAST TECHNIQUE: Contiguous axial images were obtained from the base of the skull through the vertex without intravenous contrast. COMPARISON:  None. FINDINGS: Brain: No intracranial hemorrhage, mass effect, or midline shift. No hydrocephalus. The basilar cisterns are patent. Mild to moderate chronic small vessel ischemia. No evidence of territorial infarct. No intracranial fluid collection. Vascular: No hyperdense vessel or abnormal calcification. Atherosclerosis of skullbase vasculature. Skull:  Calvarium is intact. Sinuses/Orbits: Included paranasal sinuses and mastoid air cells are well aerated. Other: None. IMPRESSION: Chronic small vessel ischemia without acute intracranial abnormality. Electronically Signed   By: Jeb Levering M.D.   On: 12/14/2015 21:28   Dg Chest Port 1 View  Result Date: 01/02/2016 CLINICAL DATA:  Acute on chronic respiratory failure  with hypoxia and hypercapnia. EXAM: PORTABLE CHEST 1 VIEW COMPARISON:  Chest radiograph 01/01/2016 FINDINGS: Persistent cardiomegaly with diffuse bilateral airspace opacities, worsened from the prior examination. CABG and AICD are unchanged. Left costophrenic angle is incompletely visualized. IMPRESSION: Cardiomegaly and worsening pulmonary edema. Electronically Signed   By: Ulyses Jarred M.D.   On: 01/02/2016 06:00    Procedures Procedures (including critical care time)  CRITICAL CARE Performed by: Destiny [name] Total critical care time: 35 minutes Critical care time was exclusive of separately billable procedures and treating other patients. Critical care was necessary to treat or prevent imminent or life-threatening deterioration. Critical care was time spent personally by me on the following activities: development of treatment plan with patient and/or surrogate as well as nursing, discussions with consultants, evaluation of patient's response to treatment, examination of patient, obtaining history from patient or surrogate, ordering and performing treatments and interventions, ordering and review of laboratory studies, ordering and review of radiographic studies, pulse oximetry and re-evaluation of patient's condition.   DIAGNOSTIC STUDIES: Oxygen Saturation is 87% on [place] 4L, low by my interpretation.    COORDINATION OF CARE: 11:00 AM Discussed treatment plan with pt at bedside which includes lab work, CXR and pt agreed to plan.    Medications Ordered in ED Medications  albuterol (PROVENTIL) (2.5 MG/3ML) 0.083% nebulizer solution 5 mg (not administered)     Initial Impression / Assessment and Plan / ED Course  I have reviewed the triage vital signs and  the nursing notes.  Pertinent labs & imaging results that were available during my care of the patient were reviewed by me and considered in my medical decision making (see chart for details).  Clinical Course    75yF with  worsening encephalopathy. Herpcapneic. Bipap. Admit.   Final Clinical Impressions(s) / ED Diagnoses   Final diagnoses:  Acute on chronic respiratory failure with hypercapnia (HCC)    New Prescriptions New Prescriptions   No medications on file    I personally preformed the services scribed in my presence. The recorded information has been reviewed is accurate. Destiny Manifold, MD.    Destiny Manifold, MD 01/05/16 [id number]

## 2016-01-01 NOTE — ED Notes (Signed)
Attempted report 

## 2016-01-01 NOTE — H&P (Signed)
History and Physical    Destiny [name] [id number] DOB: 1941-02-16 DOA: 01/01/2016   PCP: Penni Homans, MD   Patient coming from/Resides with: Private residence/lives with husband  Chief Complaint: Lethargy and hypoxia  HPI: Destiny [name] is a 75 y.o. female with medical history significant for OSA and OHA on chronic oxygen and chronic nocturnal CPAP, chronic combined systolic and diastolic heart failure as well as ischemic cardiopathy, diabetes on insulin, hypertension, paroxysmal atrial fibrillation on Coumadin, AICD in place, hypothyroidism, morbid obesity. The patient was recently discharged on 8/5 after an admission for metabolic encephalopathy felt to be secondary to polypharmacy (Neurontin) in setting of chronic kidney disease. Accumulation of sedating medications worsened by acute kidney injury at presentation. Apparently the patient had been having heart failure type symptoms and had been instructed to take additional diuretics prior to admission. 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 At time of discharge her Demadex dosage was reduced to 40 mg daily with instructions to take an extra 20 mg in the afternoon if needed for weight gain.   Since discharge patient's husband reports that she has progressively worsened with her lethargy noting it has been worse over the past 3 days. He reports that in the past she has been intolerant to fullface mask for CPAP and typically uses nasal pillows. He reports issues with the oxygen concentrator on the CPAP "not working right" and only shows that he can turn it up to 5 L therefore "I have to turn it all the way up to keep her oxygen level between 88 and 91%". He reports they utilize this machine while at the hospital with nasal pillows and patient had no problems. He does note that over the past 3 days patient has been unable to  sleep and restless and awakens around 3 AM, goes to sit in a recliner chair and removes her CPAP and utilizes nasal cannula oxygen instead. Husband states that patient is established with a pulmonologist with [facility] but he cannot remember the pulmonologist name. In regards to any heart failure symptoms. The husband reports that based on home scale her weight has been 266 pounds since discharge and only today had he noticed weight up to 267.6 pounds and some trace edema on her legs. She has not had any constitutional symptoms suggestive of an infectious process such as fevers, chills or cough. Because of progressive lethargy to the point the patient was barely arousable this morning the husband called EMS and the patient was transported to the hospital.  ED Course:  Vital signs: 98.3-116/53-76-22-4 L 87% saturation-BiPAP with 50% FiO2 sats 96% 2 view chest x-ray: demonstrates central mild vascular congestion and mild interstitial prominence bilaterally suspicious for mild interstitial edema ABG: PH 7.33-PCO2 74-PO2 57-bicarbonate 38.5-as a base excess 10 Lab data: Sodium 137, potassium 4.6, chloride 94, CO2 34, BUN 56, creatinine 2.91, glucose 128, albumin 3.4, BNP 829, poc 0.03, WBC 6900 with normal differential, hemoglobin 9.4, MCV 102, PTT 32.3, INR 3.6 Medications and treatments: Proventil nebulizer 1, Lasix 40 mg IV 1  Review of Systems:  In addition to the HPI above, **obtained from the patient's husband due to patient's altered mentation No Fever-chills, myalgias or other constitutional symptoms No Headache, changes with Vision or hearing, new weakness, tingling, numbness in any extremity, No problems swallowing food or Liquids, indigestion/reflux No Chest pain, Cough or Shortness of Breath, palpitations, orthopnea  or DOE No Abdominal pain, N/V; no melena or hematochezia, no dark tarry stools No dysuria, hematuria or flank pain No new skin rashes, lesions, masses or bruises, No new joints  pains-aches No recent weight loss No polyuria, polydypsia or polyphagia,   Past Medical History:  Diagnosis Date  . AICD (automatic cardioverter/defibrillator) present   . Anemia, unspecified 12/13/2013  . Back pain, chronic    "just when I walk; mass on 3rd and 4th vertebrae right lower back"  . Biventricular implantable cardiac defibrillator in situ 2007, 2012   a. 2007;  b. 2012 Gen change: SJM 3231-40 Uni BiV ICD, ser # [id number].  Marland Kitchen CAD (coronary artery disease)    a. 05/2001 CABG x4: LIMA->LAD, VG->D1, VG->D2, VG->RCA.  . Cardiomyopathy, ischemic    a. 2012 s/p SJM 3231-40 Uni BiV ICD, ser # [id number].  . Carotid stenosis    a. 10/19/2011 carotid duplex - Mild hard plaque bilaterally. Stable 40-59% bilateral ICA stenosis. Carotid US (04/2013):  Bilateral 40-59% ICA.  F/u 1 year  . Cerumen impaction 10/28/2015  . Chronic combined systolic and diastolic CHF (congestive heart failure) (HCC)    a. EF as low as 25% in 2006;  b. EF 60-65% in 12/2011;  c. 02/2013 Echo: EF 35-40, mod mid-dist antsept HK, Gr 2 DD, mild LVH.  . CKD (chronic kidney disease), stage III   . 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 Dyslipidemia   . Gout ~ 08/2011  . Hypercalcemia 09/26/2013  . Hypertension   . Hypothyroidism   . Hypoxemia requiring supplemental oxygen   . LBBB (left bundle branch block)   . Morbid obesity with BMI of 45.0-49.9, adult (HCC)    Ht. 5'4". BMI 47.2  . Mural thrombus of left ventricle (HCC)    before 2003, while on Coumadin, No h/o CVA  . 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 Benay Spice, in remission  . On home oxygen therapy    "normally 2-3L; 24/7" (12/14/2015)  . OSA on CPAP   . PAF (paroxysmal atrial fibrillation) (HCC)    on Coumadin  . Presence of permanent cardiac pacemaker   . 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 Vitamin D deficiency  06/09/2012   historical    Past Surgical History:  Procedure Laterality Date  . ABDOMINAL HYSTERECTOMY  1982  . CARDIAC CATHETERIZATION  06/03/01  . CARDIOVERSION N/A 07/30/2014   Procedure: CARDIOVERSION;  Surgeon: Jolaine Artist, MD;  Location: [facility] ENDOSCOPY;  Service: Cardiovascular;  Laterality: N/A;  . CARDIOVERSION N/A 11/25/2014   Procedure: CARDIOVERSION;  Surgeon: Evans Lance, MD;  Location: [facility];  Service: Cardiovascular;  Laterality: N/A;  . CATARACT EXTRACTION     left eye  . CHOLECYSTECTOMY  1970  . CORONARY ARTERY BYPASS GRAFT  2003   CABG X4  . INSERT / REPLACE / REMOVE PACEMAKER  2007; 2012   w/AICD  . REFRACTIVE SURGERY     right eye  . TEE WITHOUT CARDIOVERSION N/A 12/16/2015   Procedure: TRANSESOPHAGEAL ECHOCARDIOGRAM (TEE);  Surgeon: Jolaine Artist, MD;  Location: [facility] ENDOSCOPY;  Service: Cardiovascular;  Laterality: N/A;  . [place]    Social History   Social History  . Marital status: Married    Spouse name: N/A  . Number of children: Y  . Years of education: N/A   Occupational History  .  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    Social History Main Topics  . Smoking status: Never Smoker  . Smokeless tobacco: Never Used  . Alcohol use No  . Drug use: No  . Sexual activity: Not on file     Comment: lives with husband, no major dietary restrictions   Other Topics Concern  . Not on file   Social History Narrative   Lives in [place] with her husband.  She does not routinely exercise or adhere to any particular diet.      Mobility: Rolling walker Work history: Disabled   Allergies  Allergen Reactions  . Sulfonamide Derivatives Other (See Comments)    Unknown; "childhood allergy/mother"    Family History  Problem Relation Age of Onset  . Kidney cancer Mother     kidney and female repo - died @ 56  . Asthma Mother   . Cancer Mother     gyn and renal  . Heart disease Mother     CAD  . Heart disease  Father     died @ 27  . Stroke Father   . Diabetes Father   . Hypertension Father   . Hyperlipidemia Father   . Heart attack Father   . Hypertension Son   . Kidney cancer Maternal Uncle   . Cancer Maternal Uncle   . Cirrhosis Maternal Grandmother     non alcohol  . Cancer Maternal Grandfather   . Kidney cancer Maternal Grandfather   . Hypertension Maternal Aunt    t   Prior to Admission medications   Medication Sig Start Date End Date Taking? Authorizing Provider  acetaminophen (TYLENOL) 500 MG tablet Take 1,000 mg by mouth 2 (two) times daily as needed for mild pain.    Yes Historical Provider, MD  amiodarone (PACERONE) 200 MG tablet Take 200 mg by mouth daily.   Yes Historical Provider, MD  atorvastatin (LIPITOR) 20 MG tablet TAKE 1 TABLET EVERY DAY Patient taking differently: Take 20 mg by mouth daily 10/31/15  Yes Mosie Lukes, MD  carvedilol (COREG) 25 MG tablet TAKE 2 TABLETS TWICE DAILY Patient taking differently: Take 50 mg by mouth twice daily 08/30/15  Yes Jolaine Artist, MD  chlorpheniramine (CHLOR-TRIMETON) 4 MG tablet Take 4 mg by mouth 2 (two) times daily.   Yes Historical Provider, MD  escitalopram (LEXAPRO) 10 MG tablet TAKE 1 TABLET EVERY DAY Patient taking differently: Take 10 mg by mouth daily 11/29/15  Yes Mosie Lukes, MD  fenofibrate (TRICOR) 48 MG tablet TAKE 1 TABLET EVERY DAY Patient taking differently: Take 48 mg by mouth daily 11/16/15  Yes Jolaine Artist, MD  gabapentin (NEURONTIN) 300 MG capsule Take 300 mg by mouth at bedtime.   Yes Historical Provider, MD  glucose blood (FREESTYLE LITE) test strip DX: 250.60  Check sugars bid and as needed Patient taking differently: 1 each by Other route See admin instructions. Check blood sugar twice daily 05/22/13  Yes Mosie Lukes, MD  insulin NPH (HUMULIN N,NOVOLIN N) 100 UNIT/ML injection Inject 15-30 Units into the skin 2 (two) times daily before a meal. Take 20-30 units in the morning and 10-15 units  in the evening   Yes Historical Provider, MD  insulin regular (NOVOLIN R,HUMULIN R) 100 units/mL injection Change to 15 units three times a with meals (to cover all meals--this is the same total amount but spread out) Patient taking differently: Inject 10-15 Units into the skin 2 (two)  times daily before a meal. Change to 15 units three times a with meals (to cover all meals--this is the same total amount but spread out) 04/01/13  Yes Ivan Anchors Love, PA-C  levothyroxine (SYNTHROID, LEVOTHROID) 25 MCG tablet TAKE 1 TABLET EVERY DAY BEFORE BREAKFAST Patient taking differently: Take 25 mcg by mouth daily 11/29/15  Yes Mosie Lukes, MD  LORazepam (ATIVAN) 0.5 MG tablet Take 1 tablet (0.5 mg total) by mouth at bedtime. 10/03/15  Yes Mosie Lukes, MD  OXYGEN Inhale 3 L into the lungs continuous.   Yes Historical Provider, MD  rOPINIRole (REQUIP) 0.25 MG tablet Take 1 tablet (0.25 mg total) by mouth at bedtime. 12/17/15  Yes Reyne Dumas, MD  torsemide (DEMADEX) 20 MG tablet Take 2 tablets (40 mg total) by mouth daily. 12/17/15  Yes Reyne Dumas, MD  Vitamin D, Ergocalciferol, (DRISDOL) 50000 UNITS CAPS capsule Take 50,000 Units by mouth every Tuesday.    Yes Historical Provider, MD  warfarin (COUMADIN) 5 MG tablet TAKE AS DIRECTED BY ANTICOAGULATION CLINIC. Patient taking differently: TAKES 1 TAB (5 mg) ON SUN AND THURS, TAKES 1/2 TAB (2.5 mg) ALL OTHER DAYS 06/30/15  Yes Evans Lance, MD  albuterol (PROVENTIL HFA;VENTOLIN HFA) 108 (90 BASE) MCG/ACT inhaler Inhale 2 puffs into the lungs every 6 (six) hours as needed for wheezing or shortness of breath. 12/26/12   Mosie Lukes, MD  fluticasone (FLONASE) 50 MCG/ACT nasal spray Place 2 sprays into both nostrils daily as needed for allergies or rhinitis. As needed for nasal stuffiness. 01/13/14   Mosie Lukes, MD    Physical Exam: Vitals:   01/01/16 1215 01/01/16 1230 01/01/16 1300 01/01/16 1328  BP: 107/57 117/61 121/60 111/61  Pulse: 69 70 70 71  Resp:  21 21 18 16   Temp:      TempSrc:      SpO2: 97% 99% 97% 100%  Weight:      Height:          Constitutional: Very lethargic and upon initial evaluation barely able to awaken although in the past 20 minutes she seems to be slightly more alert but this is post arterial puncture for ABG Eyes: PERRL, lids and conjunctivae normal ENMT: Mucous membranes are dry appearing. Posterior pharynx clear of any exudate or lesions.unable to visualize due to BiPAP mask but husband reports patient has several irritating lesions on the left side of the time and inside the mouth Neck: normal, supple, no masses, no thyromegaly Respiratory: clear to auscultation bilaterally quite diminished throughout difficult to obtain accurate pulmonary exam due to patient's body habitus, no wheezing, no obvious crackles. Normal respiratory effort. No accessory muscle use.  Cardiovascular: Regular rate and rhythm-AV pacing with A sensing/V pacing, no murmurs / rubs / gallops. Trace to 1+ bilateral lower extremity edema. 2+ pedal pulses. No carotid bruits.  Abdomen: no tenderness, no masses palpated. No hepatosplenomegaly. Bowel sounds positive. Morbidly obese Musculoskeletal: no clubbing / cyanosis. No joint deformity upper and lower extremities. Good ROM, no contractures. Normal muscle tone.  Skin: no rashes, lesions, ulcers. No induration Neurologic: CN 2-12 grossly intact on inspection. Sensation appears to be intact stone gross observation and application of tactile stimulation, DTR normal. Unable to test strength secondary to altered mentation Psychiatric: Remains lethargic and initially not following commands although minimally improved 30 minutes after my initial encounter with the patient   Labs on Admission: I have personally reviewed following labs and imaging studies  CBC:  Recent Labs Lab 01/01/16  1110  WBC 6.9  NEUTROABS 4.4  HGB 9.4*  HCT 32.0*  MCV 101.9*  PLT 0000000   Basic Metabolic Panel:  Recent  Labs Lab 01/01/16 1110  NA 137  K 4.6  CL 94*  CO2 34*  GLUCOSE 128*  BUN 56*  CREATININE 2.91*  CALCIUM 8.9   GFR: Estimated Creatinine Clearance: 22.2 mL/min (by C-G formula based on SCr of 2.91 mg/dL). Liver Function Tests:  Recent Labs Lab 01/01/16 1110  AST 29  ALT 25  ALKPHOS 37*  BILITOT 0.8  PROT 6.3*  ALBUMIN 3.4*   No results for input(s): LIPASE, AMYLASE in the last 168 hours. No results for input(s): AMMONIA in the last 168 hours. Coagulation Profile:  Recent Labs Lab 12/28/15 1042 01/01/16 1110  INR 3.2 3.06   Cardiac Enzymes: No results for input(s): CKTOTAL, CKMB, CKMBINDEX, TROPONINI in the last 168 hours. BNP (last 3 results) No results for input(s): PROBNP in the last 8760 hours. HbA1C: No results for input(s): HGBA1C in the last 72 hours. CBG: No results for input(s): GLUCAP in the last 168 hours. Lipid Profile: No results for input(s): CHOL, HDL, LDLCALC, TRIG, CHOLHDL, LDLDIRECT in the last 72 hours. Thyroid Function Tests: No results for input(s): TSH, T4TOTAL, FREET4, T3FREE, THYROIDAB in the last 72 hours. Anemia Panel: No results for input(s): VITAMINB12, FOLATE, FERRITIN, TIBC, IRON, RETICCTPCT in the last 72 hours. Urine analysis:    Component Value Date/Time   COLORURINE YELLOW 12/15/2015 0107   APPEARANCEUR CLEAR 12/15/2015 0107   LABSPEC 1.016 12/15/2015 0107   PHURINE 5.5 12/15/2015 0107   GLUCOSEU NEGATIVE 12/15/2015 0107   GLUCOSEU NEGATIVE 01/13/2009 1526   HGBUR NEGATIVE 12/15/2015 0107   HGBUR large 05/17/2008 1325   BILIRUBINUR NEGATIVE 12/15/2015 0107   KETONESUR NEGATIVE 12/15/2015 0107   PROTEINUR NEGATIVE 12/15/2015 0107   UROBILINOGEN 0.2 09/25/2014 2336   NITRITE NEGATIVE 12/15/2015 0107   LEUKOCYTESUR SMALL (A) 12/15/2015 0107   Sepsis Labs: @LABRCNTIP (procalcitonin:4,lacticidven:4) )No results found for this or any previous visit (from the past 240 hour(s)).   Radiological Exams on Admission: Dg  Chest 2 View  Result Date: 01/01/2016 CLINICAL DATA:  Shortness of breath, congestive heart failure EXAM: CHEST  2 VIEW COMPARISON:  12/14/2015 FINDINGS: Cardiomegaly again noted. Status post CABG. Three leads cardiac pacemaker is unchanged in position. Central vascular congestion and mild interstitial prominence bilaterally suspicious for mild interstitial edema. There is streaky left basilar atelectasis or infiltrate. Osteopenia and degenerative changes thoracic spine. IMPRESSION: Cardiomegaly. Status post CABG. Central mild vascular congestion mild interstitial prominence bilaterally suspicious for mild interstitial edema. Streaky left basilar atelectasis or infiltrate. Electronically Signed   By: Lahoma Crocker M.D.   On: 01/01/2016 11:26    EKG: (Independently reviewed) EKG reveals 100% AV paced rhythm with atrial sensing and ventricular pacing, QTC 507 ms and setting a paced rhythm with underlying left bundle branch block  Assessment/Plan Principal Problem:   Acute on chronic respiratory failure with hypoxia and hypercapnia: a) OSA (obstructive sleep apnea)/ Obesity hypoventilation syndrome, b)  Acute on chronic combined systolic and diastolic heart failure, NYHA class 2/Known ischemic cardiomyopathy -Presents with acute altered mentation and setting up confirmed hypercarbic as well as hypoxemic respiratory failure; further discussion with husband reveals an adequate utilization of CPAP at home as well as likely a mild degree of heart failure noting 24 hours of trace edema and weight up almost 2 pounds -Remains quite lethargic despite BiPAP with persistent hypercarbia PCO2's in the 70s-PCCM consulted-has been agreeable  to short-term intubation if necessary and patient remains quite tenuous so plan is to admit to the ICU under Dr. Lamonte Sakai -Because of multiple comorbidities, need for higher level of care in the ICU and likely protracted hospital stay patient has been admitted inpatient status -Very mild  heart failure symptomatology so we'll give 1 additional dose of Lasix IV 60 mg later tonight and reevaluate in a.m. following renal function and urine output -Daily weights and strict intake and output -Continue BiPAP with adjustments based on clinical response and/or ABG -Per my discussion with Dr. Lamonte Sakai it is agreed that patient at minimum needs adjustment in her baseline CPAP pressures but may have reached the point in her OSA/OHS that she now requires BiPAP at home -Because of altered mentation and BiPAP preadmission Coreg on hold in favor of IV Lopressor scheduled with hold parameters -Not on ARB/ACE I like due to chronic kidney disease -Previous admission TEE revealed EF 40% with a ruptured MV cord with associated mild RV dysfunction -Adult acute heart failure order set initiated  Active Problems:   Acute encephalopathy -Secondary to hypercarbia -Patient has continued with the new lower Neurontin dose since discharge -She is now NPO secondary to altered mentation and requirement for BiPAP therefore preadmission Requip and Ativan also on hold (in addition to Neurontin)    Diabetes mellitus type 2 with retinopathy -Fair oral intake since arrival to home -Typically uses Novolin R for meal coverage and NPH insulin when eating but both of these are on hold since NPO -[employer] and provide moderate sliding scale coverage -Hemoglobin A1c 8/2 was 6.6       Essential hypertension -Current blood pressure actually somewhat soft -Carvedilol on hold in favor of IV Lopressor    ATRIAL FIBRILLATION, PAROXYSMAL -Currently 100% paced -Home amiodarone on hold secondary to NPO -IV Lopressor as above -Mildly prolonged QTC and setting of underlying bundle branch block -Warfarin per pharmacy -CHADVASc=5    Automatic implantable cardioverter-defibrillator in situ/PPM -As above -Husband denies being made aware of any defibrillator discharges since patient has returned home from last  hospitalization    Hypothyroidism -Synthroid converted to appropriate IV dosing -TSH was 4.27 on 8/3      DVT prophylaxis: Warfarin per pharmacy noting INR greater than 3 at presentation Code Status: Full  Family Communication: Husband at bedside Disposition Plan: Anticipate discharge back to preadmission home once medically stable Consults called: PCCM/[place] who is now assumed attending responsibilities for this patient Admission status: ICU/inpatient    Danielly Ackerley L. ANP-BC [facility] Pager [phone]   If 7PM-7AM, please contact night-coverage [email or web address] Password [email or web address]  01/01/2016, 1:35 PM

## 2016-01-01 NOTE — Consult Note (Signed)
PULMONARY / CRITICAL CARE MEDICINE   Name: Destiny [name] MRN: [id number] DOB: 1940/07/02    ADMISSION DATE:  01/01/2016 CONSULTATION DATE:  01/01/16  REFERRING MD:  Dr Cruzita Lederer  CHIEF COMPLAINT:  Acute on chronic respiratory failure  HISTORY OF PRESENT ILLNESS:   75 yo morbidly obese woman with a hx chronic hypoxemic and hypercapneic resp failure in setting of OSA / OHS (on home CPAP, followed by RA), chronic combined CHF, cardiomyopathy with AICD, parox A Fib, systemic HTN. She also has DM, chronic renal insufficiency and hypothyroidism. 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 She has been home since 8/5 on decreased demadex dose. She has experienced progressive worsening energy level, lethargy especially over the last 3 days. She has been unable to wear her CPAP for last 3 days due to associated desaturations - ? Due to clinical worsening or to malfunctioning equipment as they are concerned that O2 concentrator may not be working. Her husband has been taking her off CPAp and increasing her O2. She has gained about 2 lbs, CXR shows evidence for B perihilar infiltrates and interstitial prominence. On the am 8/20 her husband could barely wake her. In ED she was placed on NIPPV, ABG with hypercapnia and hypoxemia, pH 7.33. No reported infectious prodrome, WBC normal. After two hours she has changed little clinically, remains obtunded. PCCM asked to eval.   PAST MEDICAL HISTORY :  She  has a past medical history of AICD (automatic cardioverter/defibrillator) present; Anemia, unspecified (12/13/2013); Back pain, chronic; Biventricular implantable cardiac defibrillator in situ (2007, 2012); CAD (coronary artery disease); Cardiomyopathy, ischemic; Carotid stenosis; Cerumen impaction (10/28/2015); Chronic combined systolic and diastolic CHF (congestive heart failure) ([place]); CKD (chronic kidney disease), stage III; DM (diabetes mellitus), type 2 with complications ([place]);  Dyslipidemia; Gout (~ 08/2011); Hypercalcemia (09/26/2013); Hypertension; Hypothyroidism; Hypoxemia requiring supplemental oxygen; LBBB (left bundle branch block); Morbid obesity with BMI of 45.0-49.9, adult ([place]); Mural thrombus of left ventricle ([place]); Non-Hodgkin's lymphoma of inguinal region [facility]) (02/2009); On home oxygen therapy; OSA on CPAP; PAF (paroxysmal atrial fibrillation) ([place]); Presence of permanent cardiac pacemaker; Retinopathy due to secondary diabetes ([place]); and Vitamin D deficiency (06/09/2012).  PAST SURGICAL HISTORY: She  has a past surgical history that includes Cardiac catheterization (06/03/01); Cholecystectomy (1970); Tubal ligation (1972); Abdominal hysterectomy (1982); Insert / replace / remove pacemaker (2007; 2012); Coronary artery bypass graft (2003); Cataract extraction; Cardioversion (N/A, 07/30/2014); Refractive surgery; Cardioversion (N/A, 11/25/2014); and TEE without cardioversion (N/A, 12/16/2015).  Allergies  Allergen Reactions  . Sulfonamide Derivatives Other (See Comments)    Unknown; "childhood allergy/mother"    No current facility-administered medications on file prior to encounter.    Current Outpatient Prescriptions on File Prior to Encounter  Medication Sig  . acetaminophen (TYLENOL) 500 MG tablet Take 1,000 mg by mouth 2 (two) times daily as needed for mild pain.   Marland Kitchen amiodarone (PACERONE) 200 MG tablet Take 200 mg by mouth daily.  Marland Kitchen atorvastatin (LIPITOR) 20 MG tablet TAKE 1 TABLET EVERY DAY (Patient taking differently: Take 20 mg by mouth daily)  . carvedilol (COREG) 25 MG tablet TAKE 2 TABLETS TWICE DAILY (Patient taking differently: Take 50 mg by mouth twice daily)  . chlorpheniramine (CHLOR-TRIMETON) 4 MG tablet Take 4 mg by mouth 2 (two) times daily.  Marland Kitchen escitalopram (LEXAPRO) 10 MG tablet TAKE 1 TABLET EVERY DAY (Patient taking differently: Take 10 mg by mouth daily)  . fenofibrate (TRICOR) 48 MG tablet TAKE 1 TABLET EVERY DAY (Patient taking  differently: Take 48 mg by mouth daily)  . gabapentin (NEURONTIN) 300 MG capsule Take 300 mg by mouth at bedtime.  Marland Kitchen glucose blood (FREESTYLE LITE) test strip DX: 250.60  Check sugars bid and as needed (Patient taking differently: 1 each by Other route See admin instructions. Check blood sugar twice daily)  . insulin NPH (HUMULIN N,NOVOLIN N) 100 UNIT/ML injection Inject 15-30 Units into the skin 2 (two) times daily before a meal. Take 20-30 units in the morning and 10-15 units in the evening  . insulin regular (NOVOLIN R,HUMULIN R) 100 units/mL injection Change to 15 units three times a with meals (to cover all meals--this is the same total amount but spread out) (Patient taking differently: Inject 10-15 Units into the skin 2 (two) times daily before a meal. Change to 15 units three times a with meals (to cover all meals--this is the same total amount but spread out))  . levothyroxine (SYNTHROID, LEVOTHROID) 25 MCG tablet TAKE 1 TABLET EVERY DAY BEFORE BREAKFAST (Patient taking differently: Take 25 mcg by mouth daily)  . LORazepam (ATIVAN) 0.5 MG tablet Take 1 tablet (0.5 mg total) by mouth at bedtime.  . OXYGEN Inhale 3 L into the lungs continuous.  Marland Kitchen rOPINIRole (REQUIP) 0.25 MG tablet Take 1 tablet (0.25 mg total) by mouth at bedtime.  . torsemide (DEMADEX) 20 MG tablet Take 2 tablets (40 mg total) by mouth daily.  . Vitamin D, Ergocalciferol, (DRISDOL) 50000 UNITS CAPS capsule Take 50,000 Units by mouth every Tuesday.   . warfarin (COUMADIN) 5 MG tablet TAKE AS DIRECTED BY ANTICOAGULATION CLINIC. (Patient taking differently: TAKES 1 TAB (5 mg) ON SUN AND THURS, TAKES 1/2 TAB (2.5 mg) ALL OTHER DAYS)  . albuterol (PROVENTIL HFA;VENTOLIN HFA) 108 (90 BASE) MCG/ACT inhaler Inhale 2 puffs into the lungs every 6 (six) hours as needed for wheezing or shortness of breath.  . fluticasone (FLONASE) 50 MCG/ACT nasal spray Place 2 sprays into both nostrils daily as needed for allergies or rhinitis. As needed  for nasal stuffiness.    FAMILY HISTORY:  Her indicated that her mother is deceased. She indicated that her father is deceased. She indicated that her brother is deceased. She indicated that her maternal grandmother is deceased. She indicated that her maternal grandfather is deceased. She indicated that her paternal grandmother is deceased. She indicated that her paternal grandfather is deceased. She indicated that all of her three sons are alive. She indicated that the status of her maternal aunt is unknown. She indicated that her maternal uncle is deceased.    SOCIAL HISTORY: She  reports that she has never smoked. She has never used smokeless tobacco. She reports that she does not drink alcohol or use drugs.  REVIEW OF SYSTEMS:   As above  SUBJECTIVE:  Feeling a bit better after 2+ hours on NIPPV  VITAL SIGNS: BP 130/75   Pulse 70   Temp 98.3 F (36.8 C) (Oral)   Resp 20   Ht 5\' 4"  (1.626 m)   Wt 121.6 kg (268 lb)   SpO2 100%   BMI 46.00 kg/m   HEMODYNAMICS:    VENTILATOR SETTINGS: FiO2 (%):  [50 %] 50 %  INTAKE / OUTPUT: No intake/output data recorded.  PHYSICAL EXAMINATION: General:  Morbidly obese woman, on BiPAP Neuro:  Awake and will answer questions (improved from presentation), moves all ext, some LE jerking movements HEENT:  BiPAp mask in place, pupils 10mm and equal, sluggish.  Cardiovascular:  Regular, very distant heart sounds Lungs:  Distant, B insp crackles mid lungs Abdomen:  Large pannus, non-tender Musculoskeletal:  No deformities, trace pretibial edema Skin:  No rash  LABS:  BMET  Recent Labs Lab 01/01/16 1110  NA 137  K 4.6  CL 94*  CO2 34*  BUN 56*  CREATININE 2.91*  GLUCOSE 128*    Electrolytes  Recent Labs Lab 01/01/16 1110  CALCIUM 8.9    CBC  Recent Labs Lab 01/01/16 1110  WBC 6.9  HGB 9.4*  HCT 32.0*  PLT 162    Coag's  Recent Labs Lab 12/28/15 1042 01/01/16 1110  INR 3.2 3.06    Sepsis Markers No  results for input(s): LATICACIDVEN, PROCALCITON, O2SATVEN in the last 168 hours.  ABG  Recent Labs Lab 01/01/16 1128 01/01/16 1356  PHART 7.326* 7.329*  PCO2ART 73.7* 76.4*  PO2ART 57.0* 131.0*    Liver Enzymes  Recent Labs Lab 01/01/16 1110  AST 29  ALT 25  ALKPHOS 37*  BILITOT 0.8  ALBUMIN 3.4*    Cardiac Enzymes No results for input(s): TROPONINI, PROBNP in the last 168 hours.  Glucose No results for input(s): GLUCAP in the last 168 hours.  Imaging Dg Chest 2 View  Result Date: 01/01/2016 CLINICAL DATA:  Shortness of breath, congestive heart failure EXAM: CHEST  2 VIEW COMPARISON:  12/14/2015 FINDINGS: Cardiomegaly again noted. Status post CABG. Three leads cardiac pacemaker is unchanged in position. Central vascular congestion and mild interstitial prominence bilaterally suspicious for mild interstitial edema. There is streaky left basilar atelectasis or infiltrate. Osteopenia and degenerative changes thoracic spine. IMPRESSION: Cardiomegaly. Status post CABG. Central mild vascular congestion mild interstitial prominence bilaterally suspicious for mild interstitial edema. Streaky left basilar atelectasis or infiltrate. Electronically Signed   By: Lahoma Crocker M.D.   On: 01/01/2016 11:26     STUDIES:   CULTURES:  ANTIBIOTICS  SIGNIFICANT EVENTS:  LINES/TUBES:  DISCUSSION: 75 yo woman with Acute on chronic resp failure in setting acute on chronic diastolic and systolic CHF as well as OSA / OHS that appears to be under treated based on husband's report of her nocturnal saturations. Last CPAP titration study I can find is from 2011. She needs to continue current CPAP. Does not appear to need intubation at this time. Continue diuresis. She will need an outpatient BiPAP titration study and new pressures for her home device. I discussed code status with the patient and her husband. She would accept short term intubation for reversible processes, would not want aggressive  interventions if no chance to return to current QOL.   ASSESSMENT / PLAN:  PULMONARY A: Acute on chronic hypercapneic and hypoxemic resp failure OSA / OHS, likely on inadequate home settings  P:   Continuous BiPAP for now w transition to qhs as she improves Would send her to an ICU bed to monitor closely for worsening  She will need outpt BiPAP titration study, f/u w Dr Elsworth Soho Hold ativan and sedating meds for now  CARDIOVASCULAR A:  Acute on chronic systolic and diastolic CHF HTN A fib on anti-coag AICD in place MV regurgitation P:  Amiodarone when able to take PO IV BP control / rate control for now, transition to home meds when able.  Diuresis as she is able to tolerate based on BP and renal fxn Coumadin per pharmacy  RENAL A:   Chronic renal insufficiency P:   Diuresis as she is able to tolerate Follow BMP and UOP  GASTROINTESTINAL A:   SUP P:   pepcid for prophylaxis  HEMATOLOGIC A:   Anticoagulation with coumadin P:  Follow CBC, INR  INFECTIOUS A:   No evidence active infxn P:   Follow clinically  ENDOCRINE A:   DM hypothyroidism P:   SSI per protocol Diabetic diet Synthroid IV for now  NEUROLOGIC A:   Acute encephalopathy, likely due to evolving resp failure, improving RLS P:   RASS goal: 0 Minimize any sedating meds Restart requip when able to take PO   FAMILY  - Updates: updated pt and husband at bedside  - Inter-disciplinary family meet or Palliative Care meeting due by:  01/08/16   Baltazar Apo, MD, PhD 01/01/2016, 2:37 PM [place] [facility] [phone] or if no answer [phone]

## 2016-01-01 NOTE — ED Triage Notes (Signed)
Patient here with increasing shortness of breath and confusion x 3 days, arrived on 4l of oxygen, patient pale on arrival. Denies pain, alert to person and place

## 2016-01-02 ENCOUNTER — Inpatient Hospital Stay (HOSPITAL_COMMUNITY): Payer: Medicare Other

## 2016-01-02 LAB — GLUCOSE, CAPILLARY
GLUCOSE-CAPILLARY: 128 mg/dL — AB (ref 65–99)
GLUCOSE-CAPILLARY: 139 mg/dL — AB (ref 65–99)
GLUCOSE-CAPILLARY: 150 mg/dL — AB (ref 65–99)
GLUCOSE-CAPILLARY: 67 mg/dL (ref 65–99)
GLUCOSE-CAPILLARY: 84 mg/dL (ref 65–99)
Glucose-Capillary: 155 mg/dL — ABNORMAL HIGH (ref 65–99)
Glucose-Capillary: 181 mg/dL — ABNORMAL HIGH (ref 65–99)
Glucose-Capillary: 92 mg/dL (ref 65–99)

## 2016-01-02 LAB — PROTIME-INR
INR: 3.14
PROTHROMBIN TIME: 33 s — AB (ref 11.4–15.2)

## 2016-01-02 LAB — BASIC METABOLIC PANEL
Anion gap: 10 (ref 5–15)
BUN: 55 mg/dL — AB (ref 6–20)
CALCIUM: 9.1 mg/dL (ref 8.9–10.3)
CHLORIDE: 94 mmol/L — AB (ref 101–111)
CO2: 36 mmol/L — ABNORMAL HIGH (ref 22–32)
CREATININE: 2.83 mg/dL — AB (ref 0.44–1.00)
GFR calc Af Amer: 18 mL/min — ABNORMAL LOW (ref >60)
GFR, EST NON AFRICAN AMERICAN: 15 mL/min — AB (ref >60)
Glucose, Bld: 78 mg/dL (ref 65–99)
Potassium: 4.7 mmol/L (ref 3.5–5.1)
SODIUM: 140 mmol/L (ref 135–145)

## 2016-01-02 MED ORDER — FUROSEMIDE 10 MG/ML IJ SOLN
40.0000 mg | Freq: Once | INTRAMUSCULAR | Status: AC
  Administered 2016-01-02: 40 mg via INTRAVENOUS
  Filled 2016-01-02: qty 4

## 2016-01-02 MED ORDER — INSULIN ASPART 100 UNIT/ML ~~LOC~~ SOLN
0.0000 [IU] | SUBCUTANEOUS | Status: DC
  Administered 2016-01-02: 1 [IU] via SUBCUTANEOUS
  Administered 2016-01-02: 2 [IU] via SUBCUTANEOUS
  Administered 2016-01-03: 7 [IU] via SUBCUTANEOUS
  Administered 2016-01-03 (×3): 1 [IU] via SUBCUTANEOUS
  Administered 2016-01-03: 3 [IU] via SUBCUTANEOUS
  Administered 2016-01-04: 1 [IU] via SUBCUTANEOUS
  Administered 2016-01-04: 2 [IU] via SUBCUTANEOUS
  Administered 2016-01-04: 1 [IU] via SUBCUTANEOUS

## 2016-01-02 MED ORDER — WARFARIN SODIUM 1 MG PO TABS
1.0000 mg | ORAL_TABLET | Freq: Once | ORAL | Status: AC
  Administered 2016-01-02: 1 mg via ORAL
  Filled 2016-01-02: qty 1

## 2016-01-02 MED ORDER — DEXTROSE 50 % IV SOLN
INTRAVENOUS | Status: AC
  Administered 2016-01-02: 25 mL
  Filled 2016-01-02: qty 50

## 2016-01-02 NOTE — Consult Note (Signed)
PULMONARY / CRITICAL CARE MEDICINE   Name: MYSTICAL STORZ MRN: [id number] DOB: 04/03/1941    ADMISSION DATE:  01/01/2016 CONSULTATION DATE:  01/01/16  REFERRING MD:  Dr Cruzita Lederer  CHIEF COMPLAINT:  Acute on chronic respiratory failure  HISTORY OF PRESENT ILLNESS:   75 yo morbidly obese woman with a hx chronic hypoxemic and hypercapneic resp failure in setting of OSA / OHS (on home CPAP, followed by RA), chronic combined CHF, cardiomyopathy with AICD, parox A Fib, systemic HTN. She also has DM, chronic renal insufficiency and hypothyroidism. 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 She has been home since 8/5 on decreased demadex dose. She has experienced progressive worsening energy level, lethargy especially over the last 3 days. She has been unable to wear her CPAP for last 3 days due to associated desaturations - ? Due to clinical worsening or to malfunctioning equipment as they are concerned that O2 concentrator may not be working. Her husband has been taking her off CPAp and increasing her O2. She has gained about 2 lbs, CXR shows evidence for B perihilar infiltrates and interstitial prominence. On the am 8/20 her husband could barely wake her. In ED she was placed on NIPPV, ABG with hypercapnia and hypoxemia, pH 7.33. No reported infectious prodrome, WBC normal. After two hours she has changed little clinically, remains obtunded. PCCM asked to eval.   PAST MEDICAL HISTORY :  She  has a past medical history of AICD (automatic cardioverter/defibrillator) present; Anemia, unspecified (12/13/2013); Back pain, chronic; Biventricular implantable cardiac defibrillator in situ (2007, 2012); CAD (coronary artery disease); Cardiomyopathy, ischemic; Carotid stenosis; Cerumen impaction (10/28/2015); Chronic combined systolic and diastolic CHF (congestive heart failure) ([place]); CKD (chronic kidney disease), stage III; DM (diabetes mellitus), type 2 with complications ([place]);  Dyslipidemia; Gout (~ 08/2011); Hypercalcemia (09/26/2013); Hypertension; Hypothyroidism; Hypoxemia requiring supplemental oxygen; LBBB (left bundle branch block); Morbid obesity with BMI of 45.0-49.9, adult ([place]); Mural thrombus of left ventricle ([place]); Non-Hodgkin's lymphoma of inguinal region [facility]) (02/2009); On home oxygen therapy; OSA on CPAP; PAF (paroxysmal atrial fibrillation) ([place]); Presence of permanent cardiac pacemaker; Retinopathy due to secondary diabetes ([place]); and Vitamin D deficiency (06/09/2012).  PAST SURGICAL HISTORY: She  has a past surgical history that includes Cardiac catheterization (06/03/01); Cholecystectomy (1970); Tubal ligation (1972); Abdominal hysterectomy (1982); Insert / replace / remove pacemaker (2007; 2012); Coronary artery bypass graft (2003); Cataract extraction; Cardioversion (N/A, 07/30/2014); Refractive surgery; Cardioversion (N/A, 11/25/2014); and TEE without cardioversion (N/A, 12/16/2015).  Allergies  Allergen Reactions  . Sulfonamide Derivatives Other (See Comments)    Unknown; "childhood allergy/mother"    No current facility-administered medications on file prior to encounter.    Current Outpatient Prescriptions on File Prior to Encounter  Medication Sig  . acetaminophen (TYLENOL) 500 MG tablet Take 1,000 mg by mouth 2 (two) times daily as needed for mild pain.   Marland Kitchen amiodarone (PACERONE) 200 MG tablet Take 200 mg by mouth daily.  Marland Kitchen atorvastatin (LIPITOR) 20 MG tablet TAKE 1 TABLET EVERY DAY (Patient taking differently: Take 20 mg by mouth daily)  . carvedilol (COREG) 25 MG tablet TAKE 2 TABLETS TWICE DAILY (Patient taking differently: Take 50 mg by mouth twice daily)  . chlorpheniramine (CHLOR-TRIMETON) 4 MG tablet Take 4 mg by mouth 2 (two) times daily.  Marland Kitchen escitalopram (LEXAPRO) 10 MG tablet TAKE 1 TABLET EVERY DAY (Patient taking differently: Take 10 mg by mouth daily)  . fenofibrate (TRICOR) 48 MG tablet TAKE 1 TABLET EVERY DAY (Patient taking  differently: Take 48 mg by mouth daily)  . gabapentin (NEURONTIN) 300 MG capsule Take 300 mg by mouth at bedtime.  Marland Kitchen glucose blood (FREESTYLE LITE) test strip DX: 250.60  Check sugars bid and as needed (Patient taking differently: 1 each by Other route See admin instructions. Check blood sugar twice daily)  . insulin NPH (HUMULIN N,NOVOLIN N) 100 UNIT/ML injection Inject 15-30 Units into the skin 2 (two) times daily before a meal. Take 20-30 units in the morning and 10-15 units in the evening  . insulin regular (NOVOLIN R,HUMULIN R) 100 units/mL injection Change to 15 units three times a with meals (to cover all meals--this is the same total amount but spread out) (Patient taking differently: Inject 10-15 Units into the skin 2 (two) times daily before a meal. Change to 15 units three times a with meals (to cover all meals--this is the same total amount but spread out))  . levothyroxine (SYNTHROID, LEVOTHROID) 25 MCG tablet TAKE 1 TABLET EVERY DAY BEFORE BREAKFAST (Patient taking differently: Take 25 mcg by mouth daily)  . LORazepam (ATIVAN) 0.5 MG tablet Take 1 tablet (0.5 mg total) by mouth at bedtime.  . OXYGEN Inhale 3 L into the lungs continuous.  Marland Kitchen rOPINIRole (REQUIP) 0.25 MG tablet Take 1 tablet (0.25 mg total) by mouth at bedtime.  . torsemide (DEMADEX) 20 MG tablet Take 2 tablets (40 mg total) by mouth daily.  . Vitamin D, Ergocalciferol, (DRISDOL) 50000 UNITS CAPS capsule Take 50,000 Units by mouth every Tuesday.   . warfarin (COUMADIN) 5 MG tablet TAKE AS DIRECTED BY ANTICOAGULATION CLINIC. (Patient taking differently: TAKES 1 TAB (5 mg) ON SUN AND THURS, TAKES 1/2 TAB (2.5 mg) ALL OTHER DAYS)  . albuterol (PROVENTIL HFA;VENTOLIN HFA) 108 (90 BASE) MCG/ACT inhaler Inhale 2 puffs into the lungs every 6 (six) hours as needed for wheezing or shortness of breath.  . fluticasone (FLONASE) 50 MCG/ACT nasal spray Place 2 sprays into both nostrils daily as needed for allergies or rhinitis. As needed  for nasal stuffiness.    FAMILY HISTORY:  Her indicated that her mother is deceased. She indicated that her father is deceased. She indicated that her brother is deceased. She indicated that her maternal grandmother is deceased. She indicated that her maternal grandfather is deceased. She indicated that her paternal grandmother is deceased. She indicated that her paternal grandfather is deceased. She indicated that all of her three sons are alive. She indicated that the status of her maternal aunt is unknown. She indicated that her maternal uncle is deceased.    SOCIAL HISTORY: She  reports that she has never smoked. She has never used smokeless tobacco. She reports that she does not drink alcohol or use drugs.  REVIEW OF SYSTEMS:   As above  SUBJECTIVE:  Feels well with no complaints.  VITAL SIGNS: BP 93/82 (BP Location: Right Arm)   Pulse 70   Temp 100.1 F (37.8 C) (Oral)   Resp (!) 24   Ht 5\' 4"  (1.626 m)   Wt 266 lb 12.1 oz (121 kg)   SpO2 92%   BMI 45.79 kg/m   HEMODYNAMICS:    VENTILATOR SETTINGS: FiO2 (%):  [50 %] 50 %  INTAKE / OUTPUT: I/O last 3 completed shifts: In: 30 [P.O.:30] Out: 3050 [Urine:3050]  PHYSICAL EXAMINATION: General:  Morbidly obese woman, on BiPAP Neuro:  Awake, alert, no focal deficits HEENT:  Bipap mask in place, No thyromegaly, JVD Cardiovascular:  Regular, very distant heart sounds Lungs:  Clear, no  wheeze or crackles Abdomen:  Large pannus, non-tender Musculoskeletal:  No deformities, trace pretibial edema Skin:  No rash  LABS:  BMET  Recent Labs Lab 01/01/16 1110 01/02/16 0325  NA 137 140  K 4.6 4.7  CL 94* 94*  CO2 34* 36*  BUN 56* 55*  CREATININE 2.91* 2.83*  GLUCOSE 128* 78    Electrolytes  Recent Labs Lab 01/01/16 1110 01/02/16 0325  CALCIUM 8.9 9.1    CBC  Recent Labs Lab 01/01/16 1110  WBC 6.9  HGB 9.4*  HCT 32.0*  PLT 162    Coag's  Recent Labs Lab 12/28/15 1042 01/01/16 1110  01/02/16 0325  INR 3.2 3.06 3.14    Sepsis Markers No results for input(s): LATICACIDVEN, PROCALCITON, O2SATVEN in the last 168 hours.  ABG  Recent Labs Lab 01/01/16 1128 01/01/16 1356  PHART 7.326* 7.329*  PCO2ART 73.7* 76.4*  PO2ART 57.0* 131.0*    Liver Enzymes  Recent Labs Lab 01/01/16 1110  AST 29  ALT 25  ALKPHOS 37*  BILITOT 0.8  ALBUMIN 3.4*    Cardiac Enzymes No results for input(s): TROPONINI, PROBNP in the last 168 hours.  Glucose  Recent Labs Lab 01/01/16 1558 01/01/16 2002 01/01/16 2033 01/02/16 0006 01/02/16 0030 01/02/16 0404  GLUCAP 80 62* 109* 67 155* 128*    Imaging Dg Chest 2 View  Result Date: 01/01/2016 CLINICAL DATA:  Shortness of breath, congestive heart failure EXAM: CHEST  2 VIEW COMPARISON:  12/14/2015 FINDINGS: Cardiomegaly again noted. Status post CABG. Three leads cardiac pacemaker is unchanged in position. Central vascular congestion and mild interstitial prominence bilaterally suspicious for mild interstitial edema. There is streaky left basilar atelectasis or infiltrate. Osteopenia and degenerative changes thoracic spine. IMPRESSION: Cardiomegaly. Status post CABG. Central mild vascular congestion mild interstitial prominence bilaterally suspicious for mild interstitial edema. Streaky left basilar atelectasis or infiltrate. Electronically Signed   By: Lahoma Crocker M.D.   On: 01/01/2016 11:26   Dg Chest Port 1 View  Result Date: 01/02/2016 CLINICAL DATA:  Acute on chronic respiratory failure with hypoxia and hypercapnia. EXAM: PORTABLE CHEST 1 VIEW COMPARISON:  Chest radiograph 01/01/2016 FINDINGS: Persistent cardiomegaly with diffuse bilateral airspace opacities, worsened from the prior examination. CABG and AICD are unchanged. Left costophrenic angle is incompletely visualized. IMPRESSION: Cardiomegaly and worsening pulmonary edema. Electronically Signed   By: Ulyses Jarred M.D.   On: 01/02/2016 06:00     STUDIES:    CULTURES:  ANTIBIOTICS  SIGNIFICANT EVENTS:  LINES/TUBES:  DISCUSSION: 75 yo woman with Acute on chronic resp failure in setting acute on chronic diastolic and systolic CHF as well as OSA / OHS that appears to be under treated based on husband's report of her nocturnal saturations. Last CPAP titration study is from 2011.   She is doing well on Bipap at present. No need for intubation. We will reassess taking off Bipap later today. Continue diuresis. She will need an outpatient BiPAP titration study and new pressures for her home device. Code status discussed by Dr. Lamonte Sakai with the patient and her husband. She would accept short term intubation for reversible processes, would not want aggressive interventions if no chance to return to current QOL.   ASSESSMENT / PLAN:  PULMONARY A: Acute on chronic hypercapneic and hypoxemic resp failure OSA / OHS, likely on inadequate home settings  P:   Continuous BiPAP for now w transition to qhs as she improves Continue monitoring in ICU She will need outpt BiPAP titration study, f/u w Dr Elsworth Soho  Hold ativan and sedating meds for now  CARDIOVASCULAR A:  Acute on chronic systolic and diastolic CHF HTN A fib on anti-coag AICD in place MV regurgitation P:  Amiodarone when able to take PO IV BP control / rate control for now, transition to home meds when able.  Diuresis as she is able to tolerate based on BP and renal fxn Coumadin per pharmacy  RENAL A:   Chronic renal insufficiency P:   Continue diuresis Follow BMP and UOP  GASTROINTESTINAL A:   SUP P:   Pepcid for prophylaxis  HEMATOLOGIC A:   Anticoagulation with coumadin P:  Follow CBC, INR  INFECTIOUS A:   No evidence active infxn P:   Follow clinically  ENDOCRINE A:   DM hypothyroidism P:   SSI per protocol Diabetic diet Synthroid IV for now  NEUROLOGIC A:   Acute encephalopathy, likely due to evolving resp failure, improving RLS P:   RASS goal:  0 Minimize any sedating meds Restart requip when able to take PO  FAMILY  - Updates: updated pt and husband at bedside - Inter-disciplinary family meet or Palliative Care meeting due by:  01/08/16  Critcal care time- 35 mins.  Marshell Garfinkel MD [place] [facility] Pager [phone] If no answer or after 3pm call: [phone] 01/02/2016, 7:17 AM

## 2016-01-02 NOTE — Progress Notes (Signed)
ANTICOAGULATION CONSULT NOTE - Follow-Up Consult   Pharmacy Consult for Warfarin Indication: atrial fibrillation  Allergies  Allergen Reactions  . Sulfonamide Derivatives Other (See Comments)    Unknown; "childhood allergy/mother"    Patient Measurements: Height: 5\' 4"  (162.6 cm) Weight: 266 lb 12.1 oz (121 kg) IBW/kg (Calculated) : 54.7  Vital Signs: Temp: 98.7 F (37.1 C) (08/21 0750) Temp Source: Axillary (08/21 0750) BP: 112/35 (08/21 0900) Pulse Rate: 72 (08/21 0900)  Labs:  Recent Labs  01/01/16 1110 01/02/16 0325  HGB 9.4*  --   HCT 32.0*  --   PLT 162  --   LABPROT 32.3* 33.0*  INR 3.06 3.14  CREATININE 2.91* 2.83*    Estimated Creatinine Clearance: 22.7 mL/min (by C-G formula based on SCr of 2.83 mg/dL).   Medical History: Past Medical History:  Diagnosis Date  . AICD (automatic cardioverter/defibrillator) present   . Anemia, unspecified 12/13/2013  . Back pain, chronic    "just when I walk; mass on 3rd and 4th vertebrae right lower back"  . Biventricular implantable cardiac defibrillator in situ 2007, 2012   a. 2007;  b. 2012 Gen change: SJM 3231-40 Uni BiV ICD, ser # [id number].  Marland Kitchen CAD (coronary artery disease)    a. 05/2001 CABG x4: LIMA->LAD, VG->D1, VG->D2, VG->RCA.  . Cardiomyopathy, ischemic    a. 2012 s/p SJM 3231-40 Uni BiV ICD, ser # [id number].  . Carotid stenosis    a. 10/19/2011 carotid duplex - Mild hard plaque bilaterally. Stable 40-59% bilateral ICA stenosis. Carotid US (04/2013):  Bilateral 40-59% ICA.  F/u 1 year  . Cerumen impaction 10/28/2015  . Chronic combined systolic and diastolic CHF (congestive heart failure) (HCC)    a. EF as low as 25% in 2006;  b. EF 60-65% in 12/2011;  c. 02/2013 Echo: EF 35-40, mod mid-dist antsept HK, Gr 2 DD, mild LVH.  . CKD (chronic kidney disease), stage III   . 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 Dyslipidemia   . Gout ~ 08/2011  .  Hypercalcemia 09/26/2013  . Hypertension   . Hypothyroidism   . Hypoxemia requiring supplemental oxygen   . LBBB (left bundle branch block)   . Morbid obesity with BMI of 45.0-49.9, adult (HCC)    Ht. 5'4". BMI 47.2  . Mural thrombus of left ventricle (HCC)    before 2003, while on Coumadin, No h/o CVA  . 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 Benay Spice, in remission  . On home oxygen therapy    "normally 2-3L; 24/7" (12/14/2015)  . OSA on CPAP   . PAF (paroxysmal atrial fibrillation) (HCC)    on Coumadin  . Presence of permanent cardiac pacemaker   . 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 Vitamin D deficiency 06/09/2012   historical    Medications:  PTA: Coumadin 2.5mg  daily except 5mg  on Sunday and Thursday.  Assessment: 75 yo F presented to ED with increased SOB and confusion x 3 days.  Pharmacy dosing coumadin for a-fib.   Admit INR: 3.06- Last dose on 8/20 per outpatient records and we gave 1 mg as well last night for a total of 6 mg on Sunday.  INR today: 3.14 - Likely trending up due to acute illness and 6 mg on Sunday.   No signs of bleeding noted. Patient is not on any interacting meds currently but does  take amiodarone at home. Will monitor for re-initiation. Patient has been NPO but started a diet today around 1200.   Goal of Therapy:  INR 2-3 Monitor platelets by anticoagulation protocol: Yes   Plan:  Will repeat Coumadin 1 mg tonight due to INR trending up Daily INR  Uvaldo Bristle, Sherian Rein D Pharmacy Resident  Pager [phone] 01/02/2016 9:19 AM

## 2016-01-03 ENCOUNTER — Telehealth: Payer: Self-pay | Admitting: Family Medicine

## 2016-01-03 LAB — CBC
HCT: 32.6 % — ABNORMAL LOW (ref 36.0–46.0)
Hemoglobin: 9.5 g/dL — ABNORMAL LOW (ref 12.0–15.0)
MCH: 29.2 pg (ref 26.0–34.0)
MCHC: 29.1 g/dL — AB (ref 30.0–36.0)
MCV: 100.3 fL — ABNORMAL HIGH (ref 78.0–100.0)
PLATELETS: 165 10*3/uL (ref 150–400)
RBC: 3.25 MIL/uL — AB (ref 3.87–5.11)
RDW: 16.3 % — AB (ref 11.5–15.5)
WBC: 8.3 10*3/uL (ref 4.0–10.5)

## 2016-01-03 LAB — BASIC METABOLIC PANEL
Anion gap: 8 (ref 5–15)
BUN: 52 mg/dL — AB (ref 6–20)
CALCIUM: 9 mg/dL (ref 8.9–10.3)
CO2: 40 mmol/L — ABNORMAL HIGH (ref 22–32)
CREATININE: 2.47 mg/dL — AB (ref 0.44–1.00)
Chloride: 91 mmol/L — ABNORMAL LOW (ref 101–111)
GFR calc Af Amer: 21 mL/min — ABNORMAL LOW (ref >60)
GFR, EST NON AFRICAN AMERICAN: 18 mL/min — AB (ref >60)
Glucose, Bld: 137 mg/dL — ABNORMAL HIGH (ref 65–99)
POTASSIUM: 4.3 mmol/L (ref 3.5–5.1)
SODIUM: 139 mmol/L (ref 135–145)

## 2016-01-03 LAB — GLUCOSE, CAPILLARY
GLUCOSE-CAPILLARY: 139 mg/dL — AB (ref 65–99)
GLUCOSE-CAPILLARY: 149 mg/dL — AB (ref 65–99)
GLUCOSE-CAPILLARY: 208 mg/dL — AB (ref 65–99)
GLUCOSE-CAPILLARY: 334 mg/dL — AB (ref 65–99)
Glucose-Capillary: 140 mg/dL — ABNORMAL HIGH (ref 65–99)
Glucose-Capillary: 193 mg/dL — ABNORMAL HIGH (ref 65–99)

## 2016-01-03 LAB — MAGNESIUM: MAGNESIUM: 2 mg/dL (ref 1.7–2.4)

## 2016-01-03 LAB — PHOSPHORUS: Phosphorus: 4.1 mg/dL (ref 2.5–4.6)

## 2016-01-03 LAB — PROTIME-INR
INR: 3.51
PROTHROMBIN TIME: 36.1 s — AB (ref 11.4–15.2)

## 2016-01-03 MED ORDER — CARVEDILOL 25 MG PO TABS
50.0000 mg | ORAL_TABLET | Freq: Two times a day (BID) | ORAL | Status: DC
  Administered 2016-01-03 – 2016-01-09 (×13): 50 mg via ORAL
  Filled 2016-01-03 (×14): qty 2

## 2016-01-03 MED ORDER — FENOFIBRATE 54 MG PO TABS
54.0000 mg | ORAL_TABLET | Freq: Every day | ORAL | Status: DC
  Administered 2016-01-03 – 2016-01-09 (×7): 54 mg via ORAL
  Filled 2016-01-03 (×7): qty 1

## 2016-01-03 MED ORDER — ATORVASTATIN CALCIUM 20 MG PO TABS
20.0000 mg | ORAL_TABLET | Freq: Every day | ORAL | Status: DC
  Administered 2016-01-03 – 2016-01-08 (×6): 20 mg via ORAL
  Filled 2016-01-03 (×6): qty 1

## 2016-01-03 MED ORDER — AMIODARONE HCL 200 MG PO TABS
200.0000 mg | ORAL_TABLET | Freq: Every day | ORAL | Status: DC
  Administered 2016-01-03 – 2016-01-09 (×7): 200 mg via ORAL
  Filled 2016-01-03 (×7): qty 1

## 2016-01-03 MED ORDER — TORSEMIDE 20 MG PO TABS
40.0000 mg | ORAL_TABLET | Freq: Every day | ORAL | Status: DC
  Administered 2016-01-03 – 2016-01-09 (×7): 40 mg via ORAL
  Filled 2016-01-03 (×7): qty 2

## 2016-01-03 MED ORDER — FENOFIBRATE 48 MG PO TABS
48.0000 mg | ORAL_TABLET | Freq: Every day | ORAL | Status: DC
  Filled 2016-01-03: qty 1

## 2016-01-03 MED ORDER — LEVOTHYROXINE SODIUM 25 MCG PO TABS
25.0000 ug | ORAL_TABLET | Freq: Every day | ORAL | Status: DC
  Administered 2016-01-03 – 2016-01-06 (×4): 25 ug via ORAL
  Filled 2016-01-03 (×5): qty 1

## 2016-01-03 MED ORDER — ROPINIROLE HCL 0.25 MG PO TABS
0.2500 mg | ORAL_TABLET | Freq: Every day | ORAL | Status: DC
  Administered 2016-01-03 – 2016-01-08 (×6): 0.25 mg via ORAL
  Filled 2016-01-03 (×8): qty 1

## 2016-01-03 MED ORDER — ESCITALOPRAM OXALATE 10 MG PO TABS
10.0000 mg | ORAL_TABLET | Freq: Every day | ORAL | Status: DC
  Administered 2016-01-03 – 2016-01-09 (×7): 10 mg via ORAL
  Filled 2016-01-03 (×7): qty 1

## 2016-01-03 NOTE — Telephone Encounter (Signed)
Relation to [id number] Call back [place]   Reason for call:  Spouse wanted to inform PCP patient is currently admitted to [facility] in Intensive care, as per spouse patient is doing better.

## 2016-01-03 NOTE — Discharge Instructions (Signed)

## 2016-01-03 NOTE — Progress Notes (Signed)
PULMONARY / CRITICAL CARE MEDICINE   Name: Destiny [name] MRN: [id number] DOB: 1941-02-01    ADMISSION DATE:  01/01/2016 CONSULTATION DATE:  01/01/16  REFERRING MD:  Dr Cruzita Lederer  CHIEF COMPLAINT:  Acute on chronic respiratory failure  HISTORY OF PRESENT ILLNESS:   75 yo morbidly obese woman with a hx chronic hypoxemic and hypercapneic resp failure in setting of OSA / OHS (on home CPAP, followed by RA), chronic combined CHF, cardiomyopathy with AICD, parox A Fib, systemic HTN. She also has DM, chronic renal insufficiency and hypothyroidism. 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 She has been home since 8/5 on decreased demadex dose. She has experienced progressive worsening energy level, lethargy especially over the last 3 days. She has been unable to wear her CPAP for last 3 days due to associated desaturations - ? Due to clinical worsening or to malfunctioning equipment as they are concerned that O2 concentrator may not be working. Her husband has been taking her off CPAp and increasing her O2. She has gained about 2 lbs, CXR shows evidence for B perihilar infiltrates and interstitial prominence. On the am 8/20 her husband could barely wake her. In ED she was placed on NIPPV, ABG with hypercapnia and hypoxemia, pH 7.33. No reported infectious prodrome, WBC normal. After two hours she has changed little clinically, remains obtunded. PCCM asked to eval.   PAST MEDICAL HISTORY :  She  has a past medical history of AICD (automatic cardioverter/defibrillator) present; Anemia, unspecified (12/13/2013); Back pain, chronic; Biventricular implantable cardiac defibrillator in situ (2007, 2012); CAD (coronary artery disease); Cardiomyopathy, ischemic; Carotid stenosis; Cerumen impaction (10/28/2015); Chronic combined systolic and diastolic CHF (congestive heart failure) ([place]); CKD (chronic kidney disease), stage III; DM (diabetes mellitus), type 2 with complications ([place]);  Dyslipidemia; Gout (~ 08/2011); Hypercalcemia (09/26/2013); Hypertension; Hypothyroidism; Hypoxemia requiring supplemental oxygen; LBBB (left bundle branch block); Morbid obesity with BMI of 45.0-49.9, adult ([place]); Mural thrombus of left ventricle ([place]); Non-Hodgkin's lymphoma of inguinal region [facility]) (02/2009); On home oxygen therapy; OSA on CPAP; PAF (paroxysmal atrial fibrillation) ([place]); Presence of permanent cardiac pacemaker; Retinopathy due to secondary diabetes ([place]); and Vitamin D deficiency (06/09/2012).  PAST SURGICAL HISTORY: She  has a past surgical history that includes Cardiac catheterization (06/03/01); Cholecystectomy (1970); Tubal ligation (1972); Abdominal hysterectomy (1982); Insert / replace / remove pacemaker (2007; 2012); Coronary artery bypass graft (2003); Cataract extraction; Cardioversion (N/A, 07/30/2014); Refractive surgery; Cardioversion (N/A, 11/25/2014); and TEE without cardioversion (N/A, 12/16/2015).  Allergies  Allergen Reactions  . Sulfonamide Derivatives Other (See Comments)    Unknown; "childhood allergy/mother"    No current facility-administered medications on file prior to encounter.    Current Outpatient Prescriptions on File Prior to Encounter  Medication Sig  . acetaminophen (TYLENOL) 500 MG tablet Take 1,000 mg by mouth 2 (two) times daily as needed for mild pain.   Marland Kitchen amiodarone (PACERONE) 200 MG tablet Take 200 mg by mouth daily.  Marland Kitchen atorvastatin (LIPITOR) 20 MG tablet TAKE 1 TABLET EVERY DAY (Patient taking differently: Take 20 mg by mouth daily)  . carvedilol (COREG) 25 MG tablet TAKE 2 TABLETS TWICE DAILY (Patient taking differently: Take 50 mg by mouth twice daily)  . chlorpheniramine (CHLOR-TRIMETON) 4 MG tablet Take 4 mg by mouth 2 (two) times daily.  Marland Kitchen escitalopram (LEXAPRO) 10 MG tablet TAKE 1 TABLET EVERY DAY (Patient taking differently: Take 10 mg by mouth daily)  . fenofibrate (TRICOR) 48 MG tablet TAKE 1 TABLET EVERY DAY (Patient taking  differently: Take 48 mg by mouth daily)  . gabapentin (NEURONTIN) 300 MG capsule Take 300 mg by mouth at bedtime.  Marland Kitchen glucose blood (FREESTYLE LITE) test strip DX: 250.60  Check sugars bid and as needed (Patient taking differently: 1 each by Other route See admin instructions. Check blood sugar twice daily)  . insulin NPH (HUMULIN N,NOVOLIN N) 100 UNIT/ML injection Inject 15-30 Units into the skin 2 (two) times daily before a meal. Take 20-30 units in the morning and 10-15 units in the evening  . insulin regular (NOVOLIN R,HUMULIN R) 100 units/mL injection Change to 15 units three times a with meals (to cover all meals--this is the same total amount but spread out) (Patient taking differently: Inject 10-15 Units into the skin 2 (two) times daily before a meal. Change to 15 units three times a with meals (to cover all meals--this is the same total amount but spread out))  . levothyroxine (SYNTHROID, LEVOTHROID) 25 MCG tablet TAKE 1 TABLET EVERY DAY BEFORE BREAKFAST (Patient taking differently: Take 25 mcg by mouth daily)  . LORazepam (ATIVAN) 0.5 MG tablet Take 1 tablet (0.5 mg total) by mouth at bedtime.  . OXYGEN Inhale 3 L into the lungs continuous.  Marland Kitchen rOPINIRole (REQUIP) 0.25 MG tablet Take 1 tablet (0.25 mg total) by mouth at bedtime.  . torsemide (DEMADEX) 20 MG tablet Take 2 tablets (40 mg total) by mouth daily.  . Vitamin D, Ergocalciferol, (DRISDOL) 50000 UNITS CAPS capsule Take 50,000 Units by mouth every Tuesday.   . warfarin (COUMADIN) 5 MG tablet TAKE AS DIRECTED BY ANTICOAGULATION CLINIC. (Patient taking differently: TAKES 1 TAB (5 mg) ON SUN AND THURS, TAKES 1/2 TAB (2.5 mg) ALL OTHER DAYS)  . albuterol (PROVENTIL HFA;VENTOLIN HFA) 108 (90 BASE) MCG/ACT inhaler Inhale 2 puffs into the lungs every 6 (six) hours as needed for wheezing or shortness of breath.  . fluticasone (FLONASE) 50 MCG/ACT nasal spray Place 2 sprays into both nostrils daily as needed for allergies or rhinitis. As needed  for nasal stuffiness.    FAMILY HISTORY:  Her indicated that her mother is deceased. She indicated that her father is deceased. She indicated that her brother is deceased. She indicated that her maternal grandmother is deceased. She indicated that her maternal grandfather is deceased. She indicated that her paternal grandmother is deceased. She indicated that her paternal grandfather is deceased. She indicated that all of her three sons are alive. She indicated that the status of her maternal aunt is unknown. She indicated that her maternal uncle is deceased.    SOCIAL HISTORY: She  reports that she has never smoked. She has never used smokeless tobacco. She reports that she does not drink alcohol or use drugs.  REVIEW OF SYSTEMS:   As above  SUBJECTIVE:  Feels well with no complaints. Off bipap today AM.  VITAL SIGNS: BP (!) 146/80   Pulse 72   Temp 98.2 F (36.8 C) (Oral)   Resp 13   Ht 5\' 4"  (1.626 m)   Wt 262 lb 2 oz (118.9 kg)   SpO2 (!) 87%   BMI 44.99 kg/m   HEMODYNAMICS:    VENTILATOR SETTINGS: FiO2 (%):  [50 %] 50 %  INTAKE / OUTPUT: I/O last 3 completed shifts: In: 393 [P.O.:390; I.V.:3] Out: 4530 [Urine:4530]  PHYSICAL EXAMINATION: General:  Morbidly obese woman, on BiPAP Neuro:  Awake, alert, no focal deficits HEENT:  Bipap mask in place, No thyromegaly, JVD Cardiovascular:  Regular, very distant heart sounds Lungs:  Clear, no wheeze or crackles Abdomen:  Large pannus, non-tender Musculoskeletal:  No deformities, trace pretibial edema Skin:  No rash  LABS:  BMET  Recent Labs Lab 01/01/16 1110 01/02/16 0325 01/03/16 0324  NA 137 140 139  K 4.6 4.7 4.3  CL 94* 94* 91*  CO2 34* 36* 40*  BUN 56* 55* 52*  CREATININE 2.91* 2.83* 2.47*  GLUCOSE 128* 78 137*    Electrolytes  Recent Labs Lab 01/01/16 1110 01/02/16 0325 01/03/16 0324  CALCIUM 8.9 9.1 9.0  MG  --   --  2.0  PHOS  --   --  4.1    CBC  Recent Labs Lab 01/01/16 1110  01/03/16 0324  WBC 6.9 8.3  HGB 9.4* 9.5*  HCT 32.0* 32.6*  PLT 162 165    Coag's  Recent Labs Lab 01/01/16 1110 01/02/16 0325 01/03/16 0324  INR 3.06 3.14 3.51    Sepsis Markers No results for input(s): LATICACIDVEN, PROCALCITON, O2SATVEN in the last 168 hours.  ABG  Recent Labs Lab 01/01/16 1128 01/01/16 1356  PHART 7.326* 7.329*  PCO2ART 73.7* 76.4*  PO2ART 57.0* 131.0*    Liver Enzymes  Recent Labs Lab 01/01/16 1110  AST 29  ALT 25  ALKPHOS 37*  BILITOT 0.8  ALBUMIN 3.4*    Cardiac Enzymes No results for input(s): TROPONINI, PROBNP in the last 168 hours.  Glucose  Recent Labs Lab 01/02/16 1208 01/02/16 1527 01/02/16 1946 01/02/16 2329 01/03/16 0345 01/03/16 0746  GLUCAP 84 139* 181* 150* 140* 139*    Imaging No results found.   STUDIES:   CULTURES:  ANTIBIOTICS  SIGNIFICANT EVENTS:  LINES/TUBES:  DISCUSSION: 75 yo woman with Acute on chronic resp failure in setting acute on chronic diastolic and systolic CHF as well as OSA / OHS that appears to be under treated based on husband's report of her nocturnal saturations. Last CPAP titration study is from 2011.   She is doing well on Bipap at present. No need for intubation. We will reassess taking off Bipap later today. Continue diuresis. She will need an outpatient BiPAP titration study and new pressures for her home device. Code status discussed by Dr. Lamonte Sakai with the patient and her husband. She would accept short term intubation for reversible processes, would not want aggressive interventions if no chance to return to current QOL.   ASSESSMENT / PLAN:  PULMONARY A: Acute on chronic hypercapneic and hypoxemic resp failure OSA / OHS, likely on inadequate home settings  P:   Change bipap to qhs mandatory and as needed during the day Continue monitoring in ICU She will need outpt BiPAP titration study, f/u w Dr Elsworth Soho Hold ativan and sedating meds for now  CARDIOVASCULAR A:   Acute on chronic systolic and diastolic CHF HTN A fib on anti-coag AICD in place MV regurgitation P:  Start PO amio, coreg and home diuretics Diuresis as she is able to tolerate based on BP and renal fxn Coumadin per pharmacy  RENAL A:   Chronic renal insufficiency P:   Continue diuresis Follow BMP and UOP  GASTROINTESTINAL A:   SUP P:   Pepcid for prophylaxis  HEMATOLOGIC A:   Anticoagulation with coumadin P:  Follow CBC, INR  INFECTIOUS A:   No evidence active infxn P:   Follow clinically  ENDOCRINE A:   DM hypothyroidism P:   SSI per protocol Diabetic diet Synthroid PO  NEUROLOGIC A:   Acute encephalopathy, likely due to evolving resp failure, improving RLS P:  RASS goal: 0 Minimize any sedating meds Restart requip when able to take PO  FAMILY  - Updates: updated pt and husband at bedside - Inter-disciplinary family meet or Palliative Care meeting due by:  01/08/16  Stable for transfer to SDU.  Marshell Garfinkel MD [facility] Pager [phone] If no answer or after 3pm call: [phone] 01/03/2016, 9:49 AM

## 2016-01-03 NOTE — Progress Notes (Signed)
ANTICOAGULATION CONSULT NOTE - Follow-Up Consult   Pharmacy Consult for Warfarin Indication: atrial fibrillation  Allergies  Allergen Reactions  . Sulfonamide Derivatives Other (See Comments)    Unknown; "childhood allergy/mother"    Patient Measurements: Height: 5\' 4"  (162.6 cm) Weight: 262 lb 2 oz (118.9 kg) IBW/kg (Calculated) : 54.7  Vital Signs: Temp: 98.2 F (36.8 C) (08/22 0750) Temp Source: Oral (08/22 0750) BP: 146/80 (08/22 0900) Pulse Rate: 72 (08/22 0900)  Labs:  Recent Labs  01/01/16 1110 01/02/16 0325 01/03/16 0324  HGB 9.4*  --  9.5*  HCT 32.0*  --  32.6*  PLT 162  --  165  LABPROT 32.3* 33.0* 36.1*  INR 3.06 3.14 3.51  CREATININE 2.91* 2.83* 2.47*    Estimated Creatinine Clearance: 25.7 mL/min (by C-G formula based on SCr of 2.47 mg/dL).   Medical History: Past Medical History:  Diagnosis Date  . AICD (automatic cardioverter/defibrillator) present   . Anemia, unspecified 12/13/2013  . Back pain, chronic    "just when I walk; mass on 3rd and 4th vertebrae right lower back"  . Biventricular implantable cardiac defibrillator in situ 2007, 2012   a. 2007;  b. 2012 Gen change: SJM 3231-40 Uni BiV ICD, ser # [id number].  Marland Kitchen CAD (coronary artery disease)    a. 05/2001 CABG x4: LIMA->LAD, VG->D1, VG->D2, VG->RCA.  . Cardiomyopathy, ischemic    a. 2012 s/p SJM 3231-40 Uni BiV ICD, ser # [id number].  . Carotid stenosis    a. 10/19/2011 carotid duplex - Mild hard plaque bilaterally. Stable 40-59% bilateral ICA stenosis. Carotid US (04/2013):  Bilateral 40-59% ICA.  F/u 1 year  . Cerumen impaction 10/28/2015  . Chronic combined systolic and diastolic CHF (congestive heart failure) (HCC)    a. EF as low as 25% in 2006;  b. EF 60-65% in 12/2011;  c. 02/2013 Echo: EF 35-40, mod mid-dist antsept HK, Gr 2 DD, mild LVH.  . CKD (chronic kidney disease), stage III   . 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  Dyslipidemia   . Gout ~ 08/2011  . Hypercalcemia 09/26/2013  . Hypertension   . Hypothyroidism   . Hypoxemia requiring supplemental oxygen   . LBBB (left bundle branch block)   . Morbid obesity with BMI of 45.0-49.9, adult (HCC)    Ht. 5'4". BMI 47.2  . Mural thrombus of left ventricle (HCC)    before 2003, while on Coumadin, No h/o CVA  . 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 Benay Spice, in remission  . On home oxygen therapy    "normally 2-3L; 24/7" (12/14/2015)  . OSA on CPAP   . PAF (paroxysmal atrial fibrillation) (HCC)    on Coumadin  . Presence of permanent cardiac pacemaker   . 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 Vitamin D deficiency 06/09/2012   historical    Medications:  PTA: Coumadin 2.5mg  daily except 5mg  on Sunday and Thursday.  Assessment: 75 yo F presented to ED with increased SOB and confusion x 3 days.  Pharmacy dosing coumadin for a-fib.    INR today: 3.51 - Likely trending up due to acute illness and 6 mg on Sunday.   No signs of bleeding noted. Patient was re-started on  amiodarone today which can increase warfarin levels. The patient started a diet yesterday which she has been tolerating very well.   Goal of  Therapy:  INR 2-3 Monitor platelets by anticoagulation protocol: Yes   Plan:  Hold coumadin tonight due to upward trend and likely interactions from re-started home meds.  Daily INR  Uvaldo Bristle, Sherian Rein D Pharmacy Resident  Pager [phone] 01/03/2016 10:02 AM

## 2016-01-03 NOTE — Progress Notes (Signed)
Report called to RN. Pt transferred to [facility] via wheelchair. Pts husband called and made aware of transfer. Pts home CPAP transferred with pt.  Also made RN aware that pts foley was removed at 1300 and has not voided since.

## 2016-01-03 NOTE — Care Management Note (Signed)
Case Management Note  Patient Details  Name: Destiny [name] MRN: [id number] Date of Birth: 1941-02-16  Subjective/Objective:     Admitted acute/chronic HF   Action/Plan: Patient lives at home with spouse; PCP Dr Gwyneth Revels; private insurance with Medicare/ [employer]; pharmacy of choice is [facility] [employer] [facility], pt receives a 3 month supply of medication. She has home oxygen with [employer], walker, CPAP and cane; Also scales at home to weigh herself daily.  Pt recently refused [facility] - however is currently enrolled in [place].   CM will continue to follow for DCP  Expected Discharge Date:               Expected Discharge Plan:  Home/Self Care  In-House Referral:      Discharge planning Services  CM Consult  Choice offered to:     Status of Service:  In process, will continue to follow  Karlyne Greenspan [id number] 01/03/2016, 9:09 AM

## 2016-01-03 NOTE — Care Management Note (Addendum)
Case Management Note  Patient Details  Name: Destiny [name] MRN: [id number] Date of Birth: 1941-02-15  Subjective/Objective:       Pt admitted with shortness of breath - altered mental status             Action/Plan:  PTA from home with husband - has CPAP and supplemental O2 in the home provided by [employer].  [employer] services CPAP machine.  CM spoke in detail with husband and pt; both stated that both oxygen machines in the home are functioning properly - "my wife's sickness was the reason the machine wasn't helping her like before"  Per progress note; pt may need BIPAP/sleep study however no order as of yet/ CM requested via physician sticky notes- will continue to follow for discharge needs   Expected Discharge Date:                  Expected Discharge Plan:     In-House Referral:     Discharge planning Services  CM Consult  Post Acute Care Choice:    Choice offered to:     DME Arranged:    DME Agency:     HH Arranged:    HH Agency:     Status of Service:  In process, will continue to follow  If discussed at Long Length of Stay Meetings, dates discussed:    Additional Comments:  Maryclare Labrador, RN 01/03/2016, 3:15 PM

## 2016-01-04 LAB — CBC
HEMATOCRIT: 29.6 % — AB (ref 36.0–46.0)
HEMOGLOBIN: 8.6 g/dL — AB (ref 12.0–15.0)
MCH: 28.7 pg (ref 26.0–34.0)
MCHC: 29.1 g/dL — AB (ref 30.0–36.0)
MCV: 98.7 fL (ref 78.0–100.0)
Platelets: 161 10*3/uL (ref 150–400)
RBC: 3 MIL/uL — ABNORMAL LOW (ref 3.87–5.11)
RDW: 16.3 % — AB (ref 11.5–15.5)
WBC: 6 10*3/uL (ref 4.0–10.5)

## 2016-01-04 LAB — BASIC METABOLIC PANEL
Anion gap: 7 (ref 5–15)
BUN: 60 mg/dL — AB (ref 6–20)
CALCIUM: 8.7 mg/dL — AB (ref 8.9–10.3)
CO2: 39 mmol/L — AB (ref 22–32)
Chloride: 89 mmol/L — ABNORMAL LOW (ref 101–111)
Creatinine, Ser: 2.58 mg/dL — ABNORMAL HIGH (ref 0.44–1.00)
GFR calc non Af Amer: 17 mL/min — ABNORMAL LOW (ref >60)
GFR, EST AFRICAN AMERICAN: 20 mL/min — AB (ref >60)
Glucose, Bld: 121 mg/dL — ABNORMAL HIGH (ref 65–99)
Potassium: 4.6 mmol/L (ref 3.5–5.1)
SODIUM: 135 mmol/L (ref 135–145)

## 2016-01-04 LAB — PROTIME-INR
INR: 3.79
Prothrombin Time: 38.3 seconds — ABNORMAL HIGH (ref 11.4–15.2)

## 2016-01-04 LAB — GLUCOSE, CAPILLARY
GLUCOSE-CAPILLARY: 203 mg/dL — AB (ref 65–99)
Glucose-Capillary: 132 mg/dL — ABNORMAL HIGH (ref 65–99)
Glucose-Capillary: 209 mg/dL — ABNORMAL HIGH (ref 65–99)

## 2016-01-04 MED ORDER — AMIODARONE HCL 200 MG PO TABS: 200.0000 mg | ORAL_TABLET | Freq: Every day | ORAL | Status: DC

## 2016-01-04 MED ORDER — INSULIN NPH (HUMAN) (ISOPHANE) 100 UNIT/ML ~~LOC~~ SUSP: 15.0000 [IU] | Freq: Two times a day (BID) | SUBCUTANEOUS | Status: DC

## 2016-01-04 MED ORDER — INSULIN ASPART 100 UNIT/ML ~~LOC~~ SOLN
0.0000 [IU] | Freq: Three times a day (TID) | SUBCUTANEOUS | Status: DC
Start: 2016-01-04 — End: 2016-01-09
  Administered 2016-01-04: 7 [IU] via SUBCUTANEOUS
  Administered 2016-01-05 (×2): 4 [IU] via SUBCUTANEOUS
  Administered 2016-01-06: 3 [IU] via SUBCUTANEOUS
  Administered 2016-01-06: 7 [IU] via SUBCUTANEOUS
  Administered 2016-01-07 – 2016-01-08 (×4): 3 [IU] via SUBCUTANEOUS
  Administered 2016-01-09: 11 [IU] via SUBCUTANEOUS
  Administered 2016-01-09: 3 [IU] via SUBCUTANEOUS

## 2016-01-04 MED ORDER — ESCITALOPRAM OXALATE 10 MG PO TABS: 10.0000 mg | ORAL_TABLET | Freq: Every day | ORAL | Status: DC

## 2016-01-04 MED ORDER — GABAPENTIN 300 MG PO CAPS
300.0000 mg | ORAL_CAPSULE | Freq: Every day | ORAL | Status: DC
  Administered 2016-01-04 – 2016-01-08 (×5): 300 mg via ORAL
  Filled 2016-01-04 (×5): qty 1

## 2016-01-04 MED ORDER — GABAPENTIN 300 MG PO CAPS: 300.0000 mg | ORAL_CAPSULE | Freq: Every day | ORAL | Status: DC

## 2016-01-04 MED ORDER — ALBUTEROL SULFATE (2.5 MG/3ML) 0.083% IN NEBU: 3.0000 mL | INHALATION_SOLUTION | RESPIRATORY_TRACT | Status: DC | PRN

## 2016-01-04 MED ORDER — INSULIN NPH (HUMAN) (ISOPHANE) 100 UNIT/ML ~~LOC~~ SUSP
15.0000 [IU] | Freq: Every day | SUBCUTANEOUS | Status: DC
  Administered 2016-01-04 – 2016-01-08 (×4): 15 [IU] via SUBCUTANEOUS
  Filled 2016-01-04 (×2): qty 10

## 2016-01-04 MED ORDER — INSULIN NPH (HUMAN) (ISOPHANE) 100 UNIT/ML ~~LOC~~ SUSP
20.0000 [IU] | Freq: Every day | SUBCUTANEOUS | Status: DC
  Administered 2016-01-05 – 2016-01-09 (×5): 20 [IU] via SUBCUTANEOUS
  Filled 2016-01-04 (×2): qty 10

## 2016-01-04 NOTE — Progress Notes (Signed)
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 She was D/C home on 8/5 on decreased demadex dose and experienced progressive worsening energy level over 3 days. She had been unable to wear her CPAP for 3 days due to associated desaturations.  She gained about 2 lbs.  8/20 her husband could barely wake her, and sought medical assistance. In the ED she was placed on NIPPV, ABG with hypercapnia and hypoxemia, pH 7.33. No reported infectious prodrome, WBC normal. After two hours she had changed little clinically.    Significant Events: 8/20 admit 8/21 PCCM assumed care 8/23 TRH resumed care   Subjective: The patient has significant difficulty with desaturations when trying C Pap last night.  She was transitioned to BiPAP and this problem promptly resolved.  She tolerated BiPAP well.  Today she complains of neuropathic pain in her left lower extremity which she reports is usually well-controlled with her Neurontin.  She denies chest pain nausea vomiting or abdominal pain.  She is motivated to get up into a bedside chair and to begin to move around.  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 - wgt 122.2kg at time of d/c 8/5 - actually appears relatively well compensated at present with weight below her recent discharge weight  Filed Weights   01/02/16 0430 01/03/16 0400 01/04/16 0403  Weight: 121 kg (266 lb 12.1 oz) 118.9 kg (262 lb 2 oz) 119.6 kg (263 lb 10.7 oz)    HTN Well-controlled at this time  Parox A fib on anti-coag Coumadin per pharmacy - rate controlled  AICD in place  MV regurgitation  Chronic renal insufficiency Creatinine is stable presently - follow trend - creatinine at time of discharge 8/5 was 2.85  Chronic anemia  Likely related to renal failure - baseline hemoglobin appears to be approximately 10 - trending down at present - will need to recheck in a.m.  DM CBG currently reasonably controlled  Hypothyroidism Continue home Synthroid  Morbid obesity - Body mass index is 45.26 kg/m. The advisability of further weight loss has been discussed with the patient  DVT prophylaxis: Warfarin per pharmacy Code Status: FULL CODE Family Communication: no family present at time of exam  Disposition Plan: SDU  Consultants:  PCCM  Antimicrobials:  None  Objective: Blood pressure 130/60, pulse 70, temperature 98.3 F (36.8 C), temperature source Oral, resp. rate (!) 22, height 5\' 4"  (1.626 m), weight 119.6 kg (263 lb 10.7 oz), SpO2 97 %.  Intake/Output Summary (Last 24 hours) at 01/04/16 1043 Last data filed at 01/04/16 0900  Gross per 24 hour  Intake  580 ml  Output              375 ml  Net              205 ml   Filed Weights   01/02/16 0430 01/03/16 0400 01/04/16 0403  Weight: 121 kg (266 lb 12.1 oz) 118.9 kg (262 lb 2 oz) 119.6 kg (263 lb 10.7 oz)    Examination: General: No acute respiratory distress At rest in bed Lungs: Very distant breath sounds in all fields due to body habitus - no appreciable crackles or wheezing Cardiovascular: Regular rate and rhythm without murmur gallop or  rub normal S1 and S2 Abdomen: Nontender, morbidly obese, soft, bowel sounds positive, no rebound, no ascites, no appreciable mass Extremities: No significant cyanosis, or clubbing - chronic-appearing 1+ edema bilateral lower extremities  CBC:  Recent Labs Lab 01/01/16 1110 01/03/16 0324 01/04/16 0534  WBC 6.9 8.3 6.0  NEUTROABS 4.4  --   --   HGB 9.4* 9.5* 8.6*  HCT 32.0* 32.6* 29.6*  MCV 101.9* 100.3* 98.7  PLT 162 165 [id number]   Basic Metabolic Panel:  Recent Labs Lab 01/01/16 1110 01/02/16 0325 01/03/16 0324 01/04/16 0534  NA 137 140 139 135  K 4.6 4.7 4.3 4.6  CL 94* 94* 91* 89*  CO2 34* 36* 40* 39*  GLUCOSE 128* 78 137* 121*  BUN 56* 55* 52* 60*  CREATININE 2.91* 2.83* 2.47* 2.58*  CALCIUM 8.9 9.1 9.0 8.7*  MG  --   --  2.0  --   PHOS  --   --  4.1  --    GFR: Estimated Creatinine Clearance: 24.7 mL/min (by C-G formula based on SCr of 2.58 mg/dL).  Liver Function Tests:  Recent Labs Lab 01/01/16 1110  AST 29  ALT 25  ALKPHOS 37*  BILITOT 0.8  PROT 6.3*  ALBUMIN 3.4*    Coagulation Profile:  Recent Labs Lab 01/01/16 1110 01/02/16 0325 01/03/16 0324 01/04/16 0534  INR 3.06 3.14 3.51 3.79    HbA1C: Hgb A1c MFr Bld  Date/Time Value Ref Range Status  12/14/2015 11:25 PM 6.6 (H) 4.8 - 5.6 % Final    Comment:    (NOTE)         Pre-diabetes: 5.7 - 6.4         Diabetes: >6.4         Glycemic control for adults with diabetes: <7.0   09/26/2014 03:34 AM 7.2 (H) 4.8 - 5.6 % Final    Comment:    (NOTE)         Pre-diabetes: 5.7 - 6.4         Diabetes: >6.4         Glycemic control for adults with diabetes: <7.0     CBG:  Recent Labs Lab 01/03/16 1215 01/03/16 1553 01/03/16 2005 01/03/16 2305 01/04/16 0402  GLUCAP 149* 208* 334* 193* 132*    Recent Results (from the past 240 hour(s))  MRSA PCR Screening     Status: None   Collection Time: 01/01/16  4:42 PM  Result Value Ref Range Status   MRSA by PCR NEGATIVE NEGATIVE Final     Comment:        The GeneXpert MRSA Assay (FDA approved for NASAL specimens only), is one component of a comprehensive MRSA colonization surveillance program. It is not intended to diagnose MRSA infection nor to guide or monitor treatment for MRSA infections.      Scheduled Meds: . amiodarone  200  mg Oral Daily  . atorvastatin  20 mg Oral q1800  . carvedilol  50 mg Oral BID  . escitalopram  10 mg Oral Daily  . fenofibrate  54 mg Oral Daily  . insulin aspart  0-9 Units Subcutaneous Q4H  . levothyroxine  25 mcg Oral QAC breakfast  . rOPINIRole  0.25 mg Oral QHS  . sodium chloride flush  3 mL Intravenous Q12H  . torsemide  40 mg Oral Daily  . Warfarin - Pharmacist Dosing Inpatient   Does not apply q1800     LOS: 3 days   Cherene Altes, MD [facility] Office  [phone] Pager - Text Page per Amion as per below:  On-Call/Text Page:      [email or web address]      password [email or web address]  If 7PM-7AM, please contact night-coverage [email or web address] Password [facility] 01/04/2016, 10:43 AM

## 2016-01-04 NOTE — Care Management Important Message (Signed)
Important Message  Patient Details  Name: Destiny [name] MRN: [id number] Date of Birth: 08/26/1940   Medicare Important Message Given:  Yes    [name] May 01/04/2016, 11:24 AM

## 2016-01-04 NOTE — Progress Notes (Signed)
Inpatient Diabetes Program Recommendations  AACE/ADA: New Consensus Statement on Inpatient Glycemic Control (2015)  Target Ranges:  Prepandial:   less than 140 mg/dL      Peak postprandial:   less than 180 mg/dL (1-2 hours)      Critically ill patients:  140 - 180 mg/dL   Lab Results  Component Value Date   GLUCAP 132 (H) 01/04/2016   HGBA1C 6.6 (H) 12/14/2015    Review of Glycemic Control:  Results for Destiny [name], Destiny [name] (MRN [id number]) as of 01/04/2016 11:01  Ref. Range 01/03/2016 07:46 01/03/2016 12:15 01/03/2016 15:53 01/03/2016 20:05 01/03/2016 23:05 01/04/2016 04:02  Glucose-Capillary Latest Ref Range: 65 - 99 mg/dL 139 (H) 149 (H) 208 (H) 334 (H) 193 (H) 132 (H)   Diabetes history: Type 2 diabetes Outpatient Diabetes medications: NPH 20-30 units in the AM and 10-15 units in the PM, Regular insulin 15 units tid with meals Current orders for Inpatient glycemic control:  Novolog sensitive q 4 hours Inpatient Diabetes Program Recommendations:    Please consider restarting NPH 20 units q AM and 15 units q PM.    Thanks, Adah Perl, RN, BC-ADM Inpatient Diabetes Coordinator Pager [phone]

## 2016-01-04 NOTE — Telephone Encounter (Signed)
Let patient spouse know we are thinking of them and to let us know if we can do anything for them

## 2016-01-04 NOTE — Progress Notes (Signed)
Patient has had trouble maintaining O2 sat on home cpap all night. Sat has ranged from 72% to 87%.  RT switched patient from nasal pillows to full facemask with no improvement. After no success with cpap, RT brought v60 bipap and placed on patient. Patient is resting comfortably on 50% with O2 sat 94%. RT will continue to monitor as needed.

## 2016-01-04 NOTE — Care Management Note (Addendum)
Case Management Note  Patient Details  Name: Destiny [name] MRN: [id number] Date of Birth: 08/01/1940  Subjective/Objective:  Patient lives at home with spouse, she has a home cpap machine, a rolling walker, a motorized scooter,  And a transporter chair.  Patient states it was some years ago when she had a sleep study done.  Patient is on 3 liters of oxygen with [employer] at home.  She has pcp, and transportation at dc.  NCM will cont to follow.   8/24- NCM spoke with patient ,she states she will need to talk to her husband about [facility] services if she need it, but she does not think she will need any hh services.  NCM will check with her tomorrow.                  Action/Plan:   Expected Discharge Date:                  Expected Discharge Plan:     In-House Referral:     Discharge planning Services  CM Consult  Post Acute Care Choice:    Choice offered to:     DME Arranged:    DME Agency:     HH Arranged:    HH Agency:     Status of Service:  In process, will continue to follow  If discussed at Long Length of Stay Meetings, dates discussed:    Additional Comments:  [name], RN 01/04/2016, 3:00 PM

## 2016-01-04 NOTE — Telephone Encounter (Signed)
FYI: Caller/spouse informed; [Mr. Viney] Thanked [place] for the call and stated he will let us know if we can help them in any way/SLS 08/23

## 2016-01-04 NOTE — Progress Notes (Signed)
Pt. Taken of Bipap & placed on [place] 3 L , RT will continue to monitor.

## 2016-01-04 NOTE — Progress Notes (Signed)
ANTICOAGULATION CONSULT NOTE - Follow-Up Consult   Pharmacy Consult for Warfarin Indication: atrial fibrillation  Allergies  Allergen Reactions  . Sulfonamide Derivatives Other (See Comments)    Unknown; "childhood allergy/mother"    Patient Measurements: Height: 5\' 4"  (162.6 cm) Weight: 263 lb 10.7 oz (119.6 kg) IBW/kg (Calculated) : 54.7  Vital Signs: Temp: 98.3 F (36.8 C) (08/23 0801) Temp Source: Oral (08/23 0801) BP: 130/60 (08/23 0801) Pulse Rate: 70 (08/23 0801)  Labs:  Recent Labs  01/01/16 1110 01/02/16 0325 01/03/16 0324 01/04/16 0534  HGB 9.4*  --  9.5* 8.6*  HCT 32.0*  --  32.6* 29.6*  PLT 162  --  165 161  LABPROT 32.3* 33.0* 36.1* 38.3*  INR 3.06 3.14 3.51 3.79  CREATININE 2.91* 2.83* 2.47* 2.58*    Estimated Creatinine Clearance: 24.7 mL/min (by C-G formula based on SCr of 2.58 mg/dL).   Medical History: Past Medical History:  Diagnosis Date  . AICD (automatic cardioverter/defibrillator) present   . Anemia, unspecified 12/13/2013  . Back pain, chronic    "just when I walk; mass on 3rd and 4th vertebrae right lower back"  . Biventricular implantable cardiac defibrillator in situ 2007, 2012   a. 2007;  b. 2012 Gen change: SJM 3231-40 Uni BiV ICD, ser # [id number].  Marland Kitchen CAD (coronary artery disease)    a. 05/2001 CABG x4: LIMA->LAD, VG->D1, VG->D2, VG->RCA.  . Cardiomyopathy, ischemic    a. 2012 s/p SJM 3231-40 Uni BiV ICD, ser # [id number].  . Carotid stenosis    a. 10/19/2011 carotid duplex - Mild hard plaque bilaterally. Stable 40-59% bilateral ICA stenosis. Carotid US (04/2013):  Bilateral 40-59% ICA.  F/u 1 year  . Cerumen impaction 10/28/2015  . Chronic combined systolic and diastolic CHF (congestive heart failure) (HCC)    a. EF as low as 25% in 2006;  b. EF 60-65% in 12/2011;  c. 02/2013 Echo: EF 35-40, mod mid-dist antsept HK, Gr 2 DD, mild LVH.  . CKD (chronic kidney disease), stage III   . 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 Dyslipidemia   . Gout ~ 08/2011  . Hypercalcemia 09/26/2013  . Hypertension   . Hypothyroidism   . Hypoxemia requiring supplemental oxygen   . LBBB (left bundle branch block)   . Morbid obesity with BMI of 45.0-49.9, adult (HCC)    Ht. 5'4". BMI 47.2  . Mural thrombus of left ventricle (HCC)    before 2003, while on Coumadin, No h/o CVA  . 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 Benay Spice, in remission  . On home oxygen therapy    "normally 2-3L; 24/7" (12/14/2015)  . OSA on CPAP   . PAF (paroxysmal atrial fibrillation) (HCC)    on Coumadin  . Presence of permanent cardiac pacemaker   . Retinopathy due to secondary diabetes ([place])    type II, uncontrolled  . Vitamin D deficiency 06/09/2012   historical    Medications:  PTA: Coumadin 2.5mg  daily except 5mg  on Sunday and Thursday.  Assessment: 75 yo F presented to ED with increased SOB and confusion x 3 days.  Pharmacy dosing PTA coumadin for a-fib. INR 3.06 on admit, trending up today 3.71 after held dose last night and several reduced doses. Noted started on amio this admit and diet started 8/22. Hg low stable, plt wnl, No signs of bleeding noted.  Goal of Therapy:  INR 2-3 Monitor platelets by  anticoagulation protocol: Yes   Plan:  Hold warfarin tonight Daily INR, monitor s/sx bleeding  Elicia Lamp, PharmD, [facility] Clinical Pharmacist Pager [phone] 01/04/2016 10:54 AM

## 2016-01-05 DIAGNOSIS — Z95810 Presence of automatic (implantable) cardiac defibrillator: Secondary | ICD-10-CM

## 2016-01-05 DIAGNOSIS — I5042 Chronic combined systolic (congestive) and diastolic (congestive) heart failure: Secondary | ICD-10-CM

## 2016-01-05 DIAGNOSIS — I480 Paroxysmal atrial fibrillation: Secondary | ICD-10-CM

## 2016-01-05 DIAGNOSIS — J9602 Acute respiratory failure with hypercapnia: Secondary | ICD-10-CM

## 2016-01-05 DIAGNOSIS — J9601 Acute respiratory failure with hypoxia: Secondary | ICD-10-CM

## 2016-01-05 LAB — COMPREHENSIVE METABOLIC PANEL
ALBUMIN: 3 g/dL — AB (ref 3.5–5.0)
ALK PHOS: 38 U/L (ref 38–126)
ALT: 28 U/L (ref 14–54)
ANION GAP: 7 (ref 5–15)
AST: 38 U/L (ref 15–41)
BILIRUBIN TOTAL: 1 mg/dL (ref 0.3–1.2)
BUN: 64 mg/dL — ABNORMAL HIGH (ref 6–20)
CALCIUM: 8.5 mg/dL — AB (ref 8.9–10.3)
CO2: 36 mmol/L — ABNORMAL HIGH (ref 22–32)
Chloride: 91 mmol/L — ABNORMAL LOW (ref 101–111)
Creatinine, Ser: 2.46 mg/dL — ABNORMAL HIGH (ref 0.44–1.00)
GFR, EST AFRICAN AMERICAN: 21 mL/min — AB (ref >60)
GFR, EST NON AFRICAN AMERICAN: 18 mL/min — AB (ref >60)
GLUCOSE: 127 mg/dL — AB (ref 65–99)
Potassium: 4.6 mmol/L (ref 3.5–5.1)
Sodium: 134 mmol/L — ABNORMAL LOW (ref 135–145)
TOTAL PROTEIN: 5.9 g/dL — AB (ref 6.5–8.1)

## 2016-01-05 LAB — GLUCOSE, CAPILLARY
GLUCOSE-CAPILLARY: 118 mg/dL — AB (ref 65–99)
Glucose-Capillary: 200 mg/dL — ABNORMAL HIGH (ref 65–99)
Glucose-Capillary: 181 mg/dL — ABNORMAL HIGH (ref 65–99)
Glucose-Capillary: 142 mg/dL — ABNORMAL HIGH (ref 65–99)

## 2016-01-05 LAB — CBC
HCT: 29 % — ABNORMAL LOW (ref 36.0–46.0)
HEMOGLOBIN: 8.9 g/dL — AB (ref 12.0–15.0)
MCH: 30 pg (ref 26.0–34.0)
MCHC: 30.7 g/dL (ref 30.0–36.0)
MCV: 97.6 fL (ref 78.0–100.0)
Platelets: 154 10*3/uL (ref 150–400)
RBC: 2.97 MIL/uL — ABNORMAL LOW (ref 3.87–5.11)
RDW: 16.2 % — ABNORMAL HIGH (ref 11.5–15.5)
WBC: 5.8 10*3/uL (ref 4.0–10.5)

## 2016-01-05 LAB — PROTIME-INR
INR: 2.78
Prothrombin Time: 29.9 seconds — ABNORMAL HIGH (ref 11.4–15.2)

## 2016-01-05 MED ORDER — LORAZEPAM 1 MG PO TABS
1.0000 mg | ORAL_TABLET | Freq: Every day | ORAL | Status: DC
  Administered 2016-01-05 – 2016-01-08 (×4): 1 mg via ORAL
  Filled 2016-01-05 (×4): qty 1

## 2016-01-05 MED ORDER — LORAZEPAM 0.5 MG PO TABS: 0.5000 mg | ORAL_TABLET | Freq: Every day | ORAL | Status: DC

## 2016-01-05 MED ORDER — WARFARIN SODIUM 2 MG PO TABS
2.0000 mg | ORAL_TABLET | Freq: Once | ORAL | Status: DC
Start: 2016-01-05 — End: 2016-01-06
  Filled 2016-01-05 (×2): qty 1

## 2016-01-05 NOTE — Progress Notes (Signed)
Patient husband brought in home CPAP with NASAL PILLOWS for the patient to wear. RT added piece to the circuit to bleed in oxygen for the patient. Patient's husband said that he would be able to apply the CPAP and if he had any issues he would alert RN to contact respiratory.  RT will continue to monitor.

## 2016-01-05 NOTE — Progress Notes (Addendum)
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 She had been unable to wear her CPAP for 3 days due to associated desaturations.  She gained about 2 lbs.  8/20 her husband could barely wake her, and sought medical assistance. In the ED she was placed on NIPPV, ABG with hypercapnia and hypoxemia, pH 7.33. No reported infectious prodrome, WBC normal. After two hours she had changed little clinically.     Subjective: 8/24 A/O 4, states uses nasal pillows at home with CPAP, secondary to claustrophobia. Patient with very poor understanding of disease process   Assessment & Plan:   Principal Problem:   Acute respiratory failure with hypoxia and hypercarbia (HCC) Active Problems:   Diabetes mellitus type 2 with retinopathy ([place])   Essential hypertension   Paroxysmal atrial fibrillation (HCC)   Automatic implantable cardioverter-defibrillator in situ   OSA (obstructive sleep apnea)   History of Cardiomyopathy, ischemic   Hypothyroidism   Acute on chronic combined systolic and diastolic heart failure, NYHA class 2 (HCC)   Acute encephalopathy   Obesity hypoventilation syndrome (HCC)   Acute on chronic respiratory failure with hypoxia and hypercarbia/OSA / OHS Change bipap to qhs mandatory and as needed during the day as recommended by Pulmonary - will need outpt BiPAP titration study - f/u w Dr Elsworth Soho - must have BiPAP arranged prior to discharge home and must assure her oxygen level required can be administered via her BiPAP at  home  Chronic combined systolic and diastolic CHF TEE 8/4 showed EF 40% + RV dysfxn as well as ruptured MV chord - wgt 122.2kg at time of d/c 8/5 - actually appears relatively well compensated at present with weight below her recent discharge weight -Strict in and out since admission -4.5 L -Daily weight Filed Weights   01/02/16 0430 01/03/16 0400 01/04/16 0403  Weight: 121 kg (266 lb 12.1 oz) 118.9 kg (262 lb 2 oz) 119.6 kg (263 lb 10.7 oz)  -Transfuse for hemoglobin<8 -8/4 Echocardiogram: See results below  HTN Well-controlled at this time  Parox A fib on anti-coag -Coumadin per pharmacy  - rate controlled  AICD in place  MV regurgitation  Chronic renal insufficiency(Cr at time of discharge 8/5 was 2.85) Lab Results  Component Value Date   CREATININE 2.62 (H) 01/05/2016   CREATININE 2.46 (H) 01/05/2016   CREATININE 2.58 (H) 01/04/2016  -Creatinine is stable presently ,follow trend -   Chronic anemia( baseline hemoglobin ~10) -Likely related to renal failure   Recent Labs Lab 01/01/16 1110 01/03/16 0324 01/04/16 0534 01/05/16 0213 01/06/16 0516  HGB 9.4* 9.5* 8.6* 8.9* 9.3*    DM   Hypothyroidism -TSH pending -Continue home Synthroid  Morbid obesity - Body mass index is 45.26 kg/m. The advisability of further weight loss has been discussed with the patient   Goals of care  -8/24 consulted palliative care: Patient with multiple hospital visits clear knees a greater level of care than can be obtained at home discuss short-term/long-term goals of care, assisted living vs  palliative care vs hospice   DVT prophylaxis: Coumadin per pharmacy Code Status: Full code Family Communication:  Disposition Plan:    Consultants:    Procedures/Significant Events:  8/4 TTE; LEFT VENTRICLE: EF = 40%. Global HK  RIGHT VENTRICLE: Moderately HK  LEFT ATRIUM: Dilated  LEFT ATRIAL APPENDAGE: Not visualized  RIGHT ATRIUM: Dilated  AORTIC VALVE:   Trileaflet. Mildly calcified. Left coronary cusp fused. No signifcant AS/AI  MITRAL VALVE:    Valve thickened. Mild MS. Mean gradient 65mm HG. MVA 1.73cm2. Ruptured secondary on anterior leaflet of MV with mild prolapse. Mild MR.  TRICUSPID VALVE: Normal. Moderate TR  PULMONIC VALVE: Grossly normal. No PI  INTERATRIAL SEPTUM: No PFO or ASD.  PERICARDIUM: No effusion  DESCENDING AORTA:Mild plaque Cultures   Antimicrobials:    Devices AICD  LINES / TUBES:      Continuous Infusions:    Objective: Vitals:   01/05/16 1920 01/05/16 2341 01/06/16 0428 01/06/16 0900  BP: 111/61 140/68 (!) 127/58 (!) 131/97  Pulse: 72 70 70 69  Resp: 16 18 15  (!) 22  Temp: 98.3 F (36.8 C) 99 F (37.2 C) 97.8 F (36.6 C)   TempSrc: Oral Oral Axillary   SpO2: 92% 93% 99% 95%  Weight:      Height:       No intake or output data in the 24 hours ending 01/06/16 1001 Filed Weights   01/02/16 0430 01/03/16 0400 01/04/16 0403  Weight: 121 kg (266 lb 12.1 oz) 118.9 kg (262 lb 2 oz) 119.6 kg (263 lb 10.7 oz)    Examination:  General: No acute respiratory distress Eyes: negative scleral hemorrhage, negative anisocoria, negative icterus ENT: Negative Runny nose, negative gingival bleeding, Neck:  Negative scars, masses, torticollis, lymphadenopathy, JVD Lungs: Poor air movement all lung fields, difficult to assess because of patient's size, wheezes or crackles Cardiovascular: Irregular irregular rhythm and rate, without murmur gallop or rub normal S1 and S2 Abdomen: MORBIDLY OBESE, negative abdominal pain, nondistended, positive soft, bowel sounds, no rebound, no ascites, no appreciable mass Extremities: No significant cyanosis, clubbing, or edema bilateral lower extremities Skin: Negative rashes, lesions, ulcers Psychiatric:  Very poor understanding of disease process  Central nervous system:  Cranial nerves II through XII intact, tongue/uvula midline, all extremities muscle strength  5/5, sensation intact throughout, finger nose finger bilateral within normal limits, quick finger touch bilateral within normal limits, negative Romberg sign, heel to shin bilateral within normal limits, standing on 1 foot bilateral within normal limits, walking on tiptoes within normal limits, walking on heels within normal limits, negative dysarthria, negative expressive aphasia, negative receptive aphasia.  .     Data Reviewed: Care during the described time interval was provided by me .  I have reviewed this patient's available data, including medical history, events of note, physical examination, and all test results as part of my evaluation. I have personally reviewed and interpreted all radiology studies.  CBC:  Recent Labs Lab 01/01/16 1110 01/03/16 0324 01/04/16 0534 01/05/16 0213 01/06/16 0516  WBC 6.9 8.3 6.0 5.8 4.9  NEUTROABS 4.4  --   --   --  2.9  HGB 9.4* 9.5* 8.6* 8.9* 9.3*  HCT 32.0* 32.6* 29.6* 29.0* 31.8*  MCV 101.9* 100.3* 98.7 97.6 99.4  PLT 162 165 161 154 0000000   Basic Metabolic Panel:  Recent Labs Lab 01/02/16 0325 01/03/16 0324 01/04/16 0534 01/05/16 0213 01/05/16 2357 01/06/16 0516  NA 140 139 135 134* 136  --   K 4.7 4.3  4.6 4.6 4.6  --   CL 94* 91* 89* 91* 90*  --   CO2 36* 40* 39* 36* 38*  --   GLUCOSE 78 137* 121* 127* 102*  --   BUN 55* 52* 60* 64* 65*  --   CREATININE 2.83* 2.47* 2.58* 2.46* 2.62*  --   CALCIUM 9.1 9.0 8.7* 8.5* 8.8*  --   MG  --  2.0  --   --   --  2.2  PHOS  --  4.1  --   --   --   --    GFR: Estimated Creatinine Clearance: 24.4 mL/min (by C-G formula based on SCr of 2.62 mg/dL). Liver Function Tests:  Recent Labs Lab 01/01/16 1110 01/05/16 0213  AST 29 38  ALT 25 28  ALKPHOS 37* 38  BILITOT 0.8 1.0  PROT 6.3* 5.9*  ALBUMIN 3.4* 3.0*   No results for input(s): LIPASE, AMYLASE in the last 168 hours. No results for input(s): AMMONIA in the last 168 hours. Coagulation Profile:  Recent Labs Lab  01/02/16 0325 01/03/16 0324 01/04/16 0534 01/05/16 0213 01/06/16 0516  INR 3.14 3.51 3.79 2.78 1.88   Cardiac Enzymes: No results for input(s): CKTOTAL, CKMB, CKMBINDEX, TROPONINI in the last 168 hours. BNP (last 3 results) No results for input(s): PROBNP in the last 8760 hours. HbA1C: No results for input(s): HGBA1C in the last 72 hours. CBG:  Recent Labs Lab 01/05/16 0739 01/05/16 1154 01/05/16 1701 01/05/16 2103 01/06/16 0859  GLUCAP 118* 200* 181* 142* 108*   Lipid Profile: No results for input(s): CHOL, HDL, LDLCALC, TRIG, CHOLHDL, LDLDIRECT in the last 72 hours. Thyroid Function Tests: No results for input(s): TSH, T4TOTAL, FREET4, T3FREE, THYROIDAB in the last 72 hours. Anemia Panel: No results for input(s): VITAMINB12, FOLATE, FERRITIN, TIBC, IRON, RETICCTPCT in the last 72 hours. Urine analysis:    Component Value Date/Time   COLORURINE YELLOW 12/15/2015 0107   APPEARANCEUR CLEAR 12/15/2015 0107   LABSPEC 1.016 12/15/2015 0107   PHURINE 5.5 12/15/2015 0107   GLUCOSEU NEGATIVE 12/15/2015 0107   GLUCOSEU NEGATIVE 01/13/2009 1526   HGBUR NEGATIVE 12/15/2015 0107   HGBUR large 05/17/2008 1325   BILIRUBINUR NEGATIVE 12/15/2015 0107   KETONESUR NEGATIVE 12/15/2015 0107   PROTEINUR NEGATIVE 12/15/2015 0107   UROBILINOGEN 0.2 09/25/2014 2336   NITRITE NEGATIVE 12/15/2015 0107   LEUKOCYTESUR SMALL (A) 12/15/2015 0107   Sepsis Labs: @LABRCNTIP (procalcitonin:4,lacticidven:4)  ) Recent Results (from the past 240 hour(s))  MRSA PCR Screening     Status: None   Collection Time: 01/01/16  4:42 PM  Result Value Ref Range Status   MRSA by PCR NEGATIVE NEGATIVE Final    Comment:        The GeneXpert MRSA Assay (FDA approved for NASAL specimens only), is one component of a comprehensive MRSA colonization surveillance program. It is not intended to diagnose MRSA infection nor to guide or monitor treatment for MRSA infections.          Radiology  Studies: No results found.      Scheduled Meds: . amiodarone  200 mg Oral Daily  . atorvastatin  20 mg Oral q1800  . carvedilol  50 mg Oral BID  . escitalopram  10 mg Oral Daily  . fenofibrate  54 mg Oral Daily  . gabapentin  300 mg Oral QHS  . insulin aspart  0-20 Units Subcutaneous TID WC  . insulin NPH Human  15 Units Subcutaneous QHS  . insulin NPH Human  20 Units  Subcutaneous QAC breakfast  . levothyroxine  25 mcg Oral QAC breakfast  . LORazepam  1 mg Oral QHS  . rOPINIRole  0.25 mg Oral QHS  . sodium chloride flush  3 mL Intravenous Q12H  . torsemide  40 mg Oral Daily  . warfarin  2 mg Oral ONCE-1800  . Warfarin - Pharmacist Dosing Inpatient   Does not apply q1800   Continuous Infusions:    LOS: 5 days    Time spent: 40 minutes    WOODS, Geraldo Docker, MD [facility] Pager [phone]   If 7PM-7AM, please contact night-coverage [email or web address] Password [email or web address] 01/06/2016, 10:01 AM

## 2016-01-05 NOTE — Progress Notes (Signed)
ANTICOAGULATION CONSULT NOTE - Follow-Up Consult   Pharmacy Consult for Warfarin Indication: atrial fibrillation  Allergies  Allergen Reactions  . Sulfonamide Derivatives Other (See Comments)    Unknown; "childhood allergy/mother"    Patient Measurements: Height: 5\' 4"  (162.6 cm) Weight: 263 lb 10.7 oz (119.6 kg) IBW/kg (Calculated) : 54.7  Vital Signs: Temp: 97.7 F (36.5 C) (08/24 0740) Temp Source: Oral (08/24 0740) BP: 135/69 (08/24 0740) Pulse Rate: 70 (08/24 0740)  Labs:  Recent Labs  01/03/16 0324 01/04/16 0534 01/05/16 0213  HGB 9.5* 8.6* 8.9*  HCT 32.6* 29.6* 29.0*  PLT 165 161 154  LABPROT 36.1* 38.3* 29.9*  INR 3.51 3.79 2.78  CREATININE 2.47* 2.58* 2.46*    Estimated Creatinine Clearance: 25.9 mL/min (by C-G formula based on SCr of 2.46 mg/dL).   Medical History: Past Medical History:  Diagnosis Date  . AICD (automatic cardioverter/defibrillator) present   . Anemia, unspecified 12/13/2013  . Back pain, chronic    "just when I walk; mass on 3rd and 4th vertebrae right lower back"  . Biventricular implantable cardiac defibrillator in situ 2007, 2012   a. 2007;  b. 2012 Gen change: SJM 3231-40 Uni BiV ICD, ser # [id number].  Marland Kitchen CAD (coronary artery disease)    a. 05/2001 CABG x4: LIMA->LAD, VG->D1, VG->D2, VG->RCA.  . Cardiomyopathy, ischemic    a. 2012 s/p SJM 3231-40 Uni BiV ICD, ser # [id number].  . Carotid stenosis    a. 10/19/2011 carotid duplex - Mild hard plaque bilaterally. Stable 40-59% bilateral ICA stenosis. Carotid US (04/2013):  Bilateral 40-59% ICA.  F/u 1 year  . Cerumen impaction 10/28/2015  . Chronic combined systolic and diastolic CHF (congestive heart failure) (HCC)    a. EF as low as 25% in 2006;  b. EF 60-65% in 12/2011;  c. 02/2013 Echo: EF 35-40, mod mid-dist antsept HK, Gr 2 DD, mild LVH.  . CKD (chronic kidney disease), stage III   . 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  Dyslipidemia   . Gout ~ 08/2011  . Hypercalcemia 09/26/2013  . Hypertension   . Hypothyroidism   . Hypoxemia requiring supplemental oxygen   . LBBB (left bundle branch block)   . Morbid obesity with BMI of 45.0-49.9, adult (HCC)    Ht. 5'4". BMI 47.2  . Mural thrombus of left ventricle (HCC)    before 2003, while on Coumadin, No h/o CVA  . 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 Benay Spice, in remission  . On home oxygen therapy    "normally 2-3L; 24/7" (12/14/2015)  . OSA on CPAP   . PAF (paroxysmal atrial fibrillation) (HCC)    on Coumadin  . Presence of permanent cardiac pacemaker   . Retinopathy due to secondary diabetes ([place])    type II, uncontrolled  . Vitamin D deficiency 06/09/2012   historical    Medications:  PTA: Coumadin 2.5mg  daily except 5mg  on Sunday and Thursday.  Assessment: 75 yo F presented to ED with increased SOB and confusion x 3 days.  Pharmacy dosing PTA coumadin for a-fib. INR 3.06 on admit, peak 3.79. Now therapeutic 2.78 after several held/reduced doses. Noted started on amio this admit and diet started 8/22. Hg low stable, plt wnl, No signs of bleeding noted.  Goal of Therapy:  INR 2-3 Monitor platelets by anticoagulation protocol: Yes   Plan:  Warfarin 2mg  x 1 dose tonight Daily INR, monitor s/sx  bleeding  Elicia Lamp, PharmD, BCPS Clinical Pharmacist Pager [phone] 01/05/2016 10:59 AM

## 2016-01-05 NOTE — Consult Note (Signed)
Consultation Note Date: 01/05/2016   Patient Name: Destiny [name]  DOB: Apr 23, 1941  MRN: [id number]  Age / Sex: 75 y.o., female  PCP: Destiny Lukes, MD Referring Physician: Allie Bossier, MD  Reason for Consultation: Establishing goals of care  HPI/Patient Profile: 75 y.o. 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 She was D/C home on 8/5 on decreased demadex dose.  Patient was admitted this time with progressive worsening energy level over 3 days. She had been unable to wear her CPAP for 3 days prior to this hospitalization due to associated desaturations. In the ED she was placed on NIPPV, in this hospitalization, she was transitioned to BiPAP Q HS. She has life limiting conditions of acute on chronic hypercapneic and hypoxic respiratory failure due to uncontrolled OSA/OHS and chronic combined systolic and diastolic CHF. She is to follow up with Dr Destiny [name] in the out patient setting. Palliative care consult placed for goals of care discussions.   The patient is resting in her chair. She does not appear to be dyspneic, she does not complain of pain. She is able to provide all aspects of her history taking. I introduced myself and palliative medicine as follows: Palliative medicine is specialized medical care for people living with serious illness. It focuses on providing relief from the symptoms and stress of a serious illness. The goal is to improve quality of life for both the patient and the family.  Discussed with patient about her overall conditions and disease trajectories  for CHF and OSA/OHS. See recommendations below, thank you for the consult.    NEXT OF KIN  Husband Destiny [name] [id number]   SUMMARY OF RECOMMENDATIONS    Patient understands that she has serious chronic illness, remains hopeful for some degree of medication titrations and stabilization, for now remains full code, has AICD, wishes to continue her care with Dr Destiny [name] and Dr Destiny [name] in the out patient setting.  Resume Ativan Q HS PRN, helps patient tolerate her CPAP better.  Does not want SNF rehab, agreeable to home with home health on D/C  Code Status/Advance Care Planning:  Full code    Symptom Management:    continue current treatment, see above   Palliative Prophylaxis:   Bowel Regimen  Psycho-social/Spiritual:   Desire for further Chaplaincy support:no  Additional Recommendations: Caregiving  Support/Resources  Prognosis:   Unable to determine  Discharge Planning: Home with Home Health      Primary Diagnoses: Present on Admission: . (Resolved) Acute on chronic respiratory failure ([place]) . History of Cardiomyopathy, ischemic . OSA (obstructive sleep apnea) . Diabetes mellitus type 2 with retinopathy ([place]) . Essential hypertension . ATRIAL FIBRILLATION, PAROXYSMAL .  Automatic implantable cardioverter-defibrillator in situ . Hypothyroidism . Acute encephalopathy . Acute on chronic respiratory failure with hypoxia and hypercapnia (HCC) . Obesity hypoventilation syndrome ([place]) . Acute on chronic combined systolic and diastolic heart failure, NYHA class 2 ([place])   I have reviewed the medical record, interviewed the patient and family, and examined the patient. The following aspects are pertinent.  Past Medical History:  Diagnosis Date  . AICD (automatic cardioverter/defibrillator) present   . Anemia, unspecified 12/13/2013  . Back pain, chronic    "just when I walk; mass on 3rd and 4th vertebrae right lower back"  . Biventricular implantable cardiac  defibrillator in situ 2007, 2012   a. 2007;  b. 2012 Gen change: SJM 3231-40 Uni BiV ICD, ser # [id number].  Marland Kitchen CAD (coronary artery disease)    a. 05/2001 CABG x4: LIMA->LAD, VG->D1, VG->D2, VG->RCA.  . Cardiomyopathy, ischemic    a. 2012 s/p SJM 3231-40 Uni BiV ICD, ser # [id number].  . Carotid stenosis    a. 10/19/2011 carotid duplex - Mild hard plaque bilaterally. Stable 40-59% bilateral ICA stenosis. Carotid US (04/2013):  Bilateral 40-59% ICA.  F/u 1 year  . Cerumen impaction 10/28/2015  . Chronic combined systolic and diastolic CHF (congestive heart failure) (HCC)    a. EF as low as 25% in 2006;  b. EF 60-65% in 12/2011;  c. 02/2013 Echo: EF 35-40, mod mid-dist antsept HK, Gr 2 DD, mild LVH.  . CKD (chronic kidney disease), stage III   . 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 Dyslipidemia   . Gout ~ 08/2011  . Hypercalcemia 09/26/2013  . Hypertension   . Hypothyroidism   . Hypoxemia requiring supplemental oxygen   . LBBB (left bundle branch block)   . Morbid obesity with BMI of 45.0-49.9, adult (HCC)    Ht. 5'4". BMI 47.2  . Mural thrombus of left ventricle (HCC)    before 2003, while on Coumadin, No h/o CVA  . 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 Benay Spice, in remission  . On home oxygen therapy    "normally 2-3L; 24/7" (12/14/2015)  . OSA on CPAP   . PAF (paroxysmal atrial fibrillation) (HCC)    on Coumadin  . Presence of permanent cardiac pacemaker   . Retinopathy due to secondary diabetes ([place])    type II, uncontrolled  . Vitamin D deficiency 06/09/2012   historical   Social History   Social History  . Marital status: Married    Spouse name: N/A  . Number of children: Y  . Years of education: N/A   Occupational History  . 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    Social History Main Topics  . Smoking status: Never Smoker  . Smokeless tobacco: Never Used  .  Alcohol use No  . Drug use: No  . Sexual activity: Not Asked     Comment: lives with husband, no major dietary restrictions   Other Topics Concern  . None   Social History Narrative   Lives in [place] with her husband.  She does not routinely exercise or adhere to any particular diet.     Family History  Problem Relation Age of Onset  . Kidney cancer Mother     kidney and female repo - died @ 68  . Asthma Mother   . Cancer Mother     gyn and renal  .  Heart disease Mother     CAD  . Heart disease Father     died @ 83  . Stroke Father   . Diabetes Father   . Hypertension Father   . Hyperlipidemia Father   . Heart attack Father   . Hypertension Son   . Kidney cancer Maternal Uncle   . Cancer Maternal Uncle   . Cirrhosis Maternal Grandmother     non alcohol  . Cancer Maternal Grandfather   . Kidney cancer Maternal Grandfather   . Hypertension Maternal Aunt    Scheduled Meds: . amiodarone  200 mg Oral Daily  . atorvastatin  20 mg Oral q1800  . carvedilol  50 mg Oral BID  . escitalopram  10 mg Oral Daily  . fenofibrate  54 mg Oral Daily  . gabapentin  300 mg Oral QHS  . insulin aspart  0-20 Units Subcutaneous TID WC  . insulin NPH Human  15 Units Subcutaneous QHS  . insulin NPH Human  20 Units Subcutaneous QAC breakfast  . levothyroxine  25 mcg Oral QAC breakfast  . LORazepam  0.5 mg Oral QHS  . rOPINIRole  0.25 mg Oral QHS  . sodium chloride flush  3 mL Intravenous Q12H  . torsemide  40 mg Oral Daily  . warfarin  2 mg Oral ONCE-1800  . Warfarin - Pharmacist Dosing Inpatient   Does not apply q1800   Continuous Infusions:  PRN Meds:.sodium chloride, acetaminophen, albuterol, fluticasone, ondansetron (ZOFRAN) IV, sodium chloride flush Medications Prior to Admission:  Prior to Admission medications   Medication Sig Start Date End Date Taking? Authorizing Provider  acetaminophen (TYLENOL) 500 MG tablet Take 1,000 mg by mouth 2 (two) times daily as needed for mild  pain.    Yes Historical Provider, MD  amiodarone (PACERONE) 200 MG tablet Take 200 mg by mouth daily.   Yes Historical Provider, MD  atorvastatin (LIPITOR) 20 MG tablet TAKE 1 TABLET EVERY DAY Patient taking differently: Take 20 mg by mouth daily 10/31/15  Yes Destiny Lukes, MD  carvedilol (COREG) 25 MG tablet TAKE 2 TABLETS TWICE DAILY Patient taking differently: Take 50 mg by mouth twice daily 08/30/15  Yes Jolaine Artist, MD  chlorpheniramine (CHLOR-TRIMETON) 4 MG tablet Take 4 mg by mouth 2 (two) times daily.   Yes Historical Provider, MD  escitalopram (LEXAPRO) 10 MG tablet TAKE 1 TABLET EVERY DAY Patient taking differently: Take 10 mg by mouth daily 11/29/15  Yes Destiny Lukes, MD  fenofibrate (TRICOR) 48 MG tablet TAKE 1 TABLET EVERY DAY Patient taking differently: Take 48 mg by mouth daily 11/16/15  Yes Jolaine Artist, MD  gabapentin (NEURONTIN) 300 MG capsule Take 300 mg by mouth at bedtime.   Yes Historical Provider, MD  glucose blood (FREESTYLE LITE) test strip DX: 250.60  Check sugars bid and as needed Patient taking differently: 1 each by Other route See admin instructions. Check blood sugar twice daily 05/22/13  Yes Destiny Lukes, MD  insulin NPH (HUMULIN N,NOVOLIN N) 100 UNIT/ML injection Inject 15-30 Units into the skin 2 (two) times daily before a meal. Take 20-30 units in the morning and 10-15 units in the evening   Yes Historical Provider, MD  insulin regular (NOVOLIN R,HUMULIN R) 100 units/mL injection Change to 15 units three times a with meals (to cover all meals--this is the same total amount but spread out) Patient taking differently: Inject 10-15 Units into the skin 2 (two) times daily before a meal. Change to 15  units three times a with meals (to cover all meals--this is the same total amount but spread out) 04/01/13  Yes Ivan Anchors Love, PA-C  levothyroxine (SYNTHROID, LEVOTHROID) 25 MCG tablet TAKE 1 TABLET EVERY DAY BEFORE BREAKFAST Patient taking differently: Take  25 mcg by mouth daily 11/29/15  Yes Destiny Lukes, MD  LORazepam (ATIVAN) 0.5 MG tablet Take 1 tablet (0.5 mg total) by mouth at bedtime. 10/03/15  Yes Destiny Lukes, MD  OXYGEN Inhale 3 L into the lungs continuous.   Yes Historical Provider, MD  rOPINIRole (REQUIP) 0.25 MG tablet Take 1 tablet (0.25 mg total) by mouth at bedtime. 12/17/15  Yes Reyne Dumas, MD  torsemide (DEMADEX) 20 MG tablet Take 2 tablets (40 mg total) by mouth daily. 12/17/15  Yes Reyne Dumas, MD  Vitamin D, Ergocalciferol, (DRISDOL) 50000 UNITS CAPS capsule Take 50,000 Units by mouth every Tuesday.    Yes Historical Provider, MD  warfarin (COUMADIN) 5 MG tablet TAKE AS DIRECTED BY ANTICOAGULATION CLINIC. Patient taking differently: TAKES 1 TAB (5 mg) ON SUN AND THURS, TAKES 1/2 TAB (2.5 mg) ALL OTHER DAYS 06/30/15  Yes Evans Lance, MD  albuterol (PROVENTIL HFA;VENTOLIN HFA) 108 (90 BASE) MCG/ACT inhaler Inhale 2 puffs into the lungs every 6 (six) hours as needed for wheezing or shortness of breath. 12/26/12   Destiny Lukes, MD  fluticasone (FLONASE) 50 MCG/ACT nasal spray Place 2 sprays into both nostrils daily as needed for allergies or rhinitis. As needed for nasal stuffiness. 01/13/14   Destiny Lukes, MD   Allergies  Allergen Reactions  . Sulfonamide Derivatives Other (See Comments)    Unknown; "childhood allergy/mother"   Review of Systems Denies pain, denies dyspnea.   Physical Exam Obese lady resting in her chair NAD S1 S2 Distant heart and lung sounds due to body habitus Abdomen distended 1+ edema Awake alert non focal  Vital Signs: BP (!) 113/55 (BP Location: Left Arm)   Pulse 70   Temp 98 F (36.7 C) (Oral)   Resp 14   Ht 5\' 4"  (1.626 m)   Wt 119.6 kg (263 lb 10.7 oz)   SpO2 93%   BMI 45.26 kg/m  Pain Assessment: No/denies pain   Pain Score: 0-No pain   SpO2: SpO2: 93 % O2 Device:SpO2: 93 % O2 Flow Rate: .O2 Flow Rate (L/min): 4 L/min  IO: Intake/output summary:   Intake/Output Summary  (Last 24 hours) at 01/05/16 1507 Last data filed at 01/05/16 0734  Gross per 24 hour  Intake              100 ml  Output              203 ml  Net             -103 ml    LBM: Last BM Date: 01/02/16 Baseline Weight: Weight: 121.6 kg (268 lb) Most recent weight: Weight: 119.6 kg (263 lb 10.7 oz)     Palliative Assessment/Data:   Flowsheet Rows   Flowsheet Row Most Recent Value  Intake Tab  Referral Department  Hospitalist  Unit at Time of Referral  Intermediate Care Unit  Palliative Care Primary Diagnosis  Cardiac  Palliative Care Type  New Palliative care  Reason for referral  Clarify Goals of Care  Date first seen by Palliative Care  01/05/16  Clinical Assessment  Palliative Performance Scale Score  30%  Pain Max last 24 hours  3  Pain Min Last 24 hours  2  Dyspnea Max Last 24 Hours  3  Dyspnea Min Last 24 hours  2  Nausea Max Last 24 Hours  3  Nausea Min Last 24 Hours  2  Psychosocial & Spiritual Assessment  Palliative Care Outcomes  Patient/Family meeting held?  Yes  Who was at the meeting?  patient herself who is decisional.   Palliative Care Outcomes  Clarified goals of care      Time In:  1330 Time Out:  1430 Time Total:  60 min  Greater than 50%  of this time was spent counseling and coordinating care related to the above assessment and plan.  Signed by: Loistine Chance, MD  [phone]  Please contact Palliative Medicine Team phone at [phone] for questions and concerns.  For individual provider: See Shea Evans

## 2016-01-06 ENCOUNTER — Other Ambulatory Visit: Payer: Self-pay

## 2016-01-06 DIAGNOSIS — I255 Ischemic cardiomyopathy: Secondary | ICD-10-CM

## 2016-01-06 DIAGNOSIS — I10 Essential (primary) hypertension: Secondary | ICD-10-CM

## 2016-01-06 DIAGNOSIS — E1122 Type 2 diabetes mellitus with diabetic chronic kidney disease: Secondary | ICD-10-CM

## 2016-01-06 DIAGNOSIS — E038 Other specified hypothyroidism: Secondary | ICD-10-CM

## 2016-01-06 LAB — BLOOD GAS, ARTERIAL
ACID-BASE EXCESS: 12.8 mmol/L — AB (ref 0.0–2.0)
Bicarbonate: 38.6 mEq/L — ABNORMAL HIGH (ref 20.0–24.0)
DELIVERY SYSTEMS: POSITIVE
DRAWN BY: 40415
Expiratory PAP: 10
FIO2: 50
O2 Saturation: 95.3 %
PATIENT TEMPERATURE: 98.6
PO2 ART: 80 mmHg (ref 80.0–100.0)
TCO2: 40.8 mmol/L (ref 0–100)
pCO2 arterial: 69 mmHg (ref 35.0–45.0)
pH, Arterial: 7.367 (ref 7.350–7.450)

## 2016-01-06 LAB — CBC WITH DIFFERENTIAL/PLATELET
Basophils Absolute: 0 10*3/uL (ref 0.0–0.1)
Basophils Relative: 0 %
EOS ABS: 0.2 10*3/uL (ref 0.0–0.7)
Eosinophils Relative: 3 %
HEMATOCRIT: 31.8 % — AB (ref 36.0–46.0)
HEMOGLOBIN: 9.3 g/dL — AB (ref 12.0–15.0)
LYMPHS ABS: 1.1 10*3/uL (ref 0.7–4.0)
Lymphocytes Relative: 22 %
MCH: 29.1 pg (ref 26.0–34.0)
MCHC: 29.2 g/dL — AB (ref 30.0–36.0)
MCV: 99.4 fL (ref 78.0–100.0)
MONOS PCT: 17 %
Monocytes Absolute: 0.8 10*3/uL (ref 0.1–1.0)
NEUTROS ABS: 2.9 10*3/uL (ref 1.7–7.7)
NEUTROS PCT: 58 %
Platelets: 176 10*3/uL (ref 150–400)
RBC: 3.2 MIL/uL — ABNORMAL LOW (ref 3.87–5.11)
RDW: 16.2 % — ABNORMAL HIGH (ref 11.5–15.5)
WBC: 4.9 10*3/uL (ref 4.0–10.5)

## 2016-01-06 LAB — BASIC METABOLIC PANEL
ANION GAP: 8 (ref 5–15)
BUN: 65 mg/dL — ABNORMAL HIGH (ref 6–20)
CALCIUM: 8.8 mg/dL — AB (ref 8.9–10.3)
CO2: 38 mmol/L — AB (ref 22–32)
Chloride: 90 mmol/L — ABNORMAL LOW (ref 101–111)
Creatinine, Ser: 2.62 mg/dL — ABNORMAL HIGH (ref 0.44–1.00)
GFR calc Af Amer: 20 mL/min — ABNORMAL LOW (ref >60)
GFR calc non Af Amer: 17 mL/min — ABNORMAL LOW (ref >60)
GLUCOSE: 102 mg/dL — AB (ref 65–99)
Potassium: 4.6 mmol/L (ref 3.5–5.1)
Sodium: 136 mmol/L (ref 135–145)

## 2016-01-06 LAB — GLUCOSE, CAPILLARY
GLUCOSE-CAPILLARY: 108 mg/dL — AB (ref 65–99)
GLUCOSE-CAPILLARY: 152 mg/dL — AB (ref 65–99)
Glucose-Capillary: 133 mg/dL — ABNORMAL HIGH (ref 65–99)
Glucose-Capillary: 231 mg/dL — ABNORMAL HIGH (ref 65–99)
Glucose-Capillary: 156 mg/dL — ABNORMAL HIGH (ref 65–99)

## 2016-01-06 LAB — PROTIME-INR
INR: 1.88
Prothrombin Time: 21.9 seconds — ABNORMAL HIGH (ref 11.4–15.2)

## 2016-01-06 LAB — MAGNESIUM: Magnesium: 2.2 mg/dL (ref 1.7–2.4)

## 2016-01-06 MED ORDER — LEVOTHYROXINE SODIUM 50 MCG PO TABS
50.0000 ug | ORAL_TABLET | Freq: Every day | ORAL | Status: DC
  Administered 2016-01-07 – 2016-01-09 (×3): 50 ug via ORAL
  Filled 2016-01-06 (×3): qty 1

## 2016-01-06 MED ORDER — WARFARIN SODIUM 4 MG PO TABS
4.0000 mg | ORAL_TABLET | Freq: Once | ORAL | Status: AC
  Administered 2016-01-06: 4 mg via ORAL
  Filled 2016-01-06: qty 1

## 2016-01-06 NOTE — Progress Notes (Signed)
ANTICOAGULATION CONSULT NOTE - Follow-Up Consult   Pharmacy Consult for Warfarin Indication: atrial fibrillation  Allergies  Allergen Reactions  . Sulfonamide Derivatives Other (See Comments)    Unknown; "childhood allergy/mother"    Patient Measurements: Height: 5\' 4"  (162.6 cm) Weight: 263 lb 10.7 oz (119.6 kg) IBW/kg (Calculated) : 54.7  Vital Signs: Temp: 97.8 F (36.6 C) (08/25 0428) Temp Source: Axillary (08/25 0428) BP: 131/97 (08/25 0900) Pulse Rate: 69 (08/25 0900)  Labs:  Recent Labs  01/04/16 0534 01/05/16 0213 01/05/16 2357 01/06/16 0516  HGB 8.6* 8.9*  --  9.3*  HCT 29.6* 29.0*  --  31.8*  PLT 161 154  --  176  LABPROT 38.3* 29.9*  --  21.9*  INR 3.79 2.78  --  1.88  CREATININE 2.58* 2.46* 2.62*  --     Estimated Creatinine Clearance: 24.4 mL/min (by C-G formula based on SCr of 2.62 mg/dL).   Medical History: Past Medical History:  Diagnosis Date  . AICD (automatic cardioverter/defibrillator) present   . Anemia, unspecified 12/13/2013  . Back pain, chronic    "just when I walk; mass on 3rd and 4th vertebrae right lower back"  . Biventricular implantable cardiac defibrillator in situ 2007, 2012   a. 2007;  b. 2012 Gen change: SJM 3231-40 Uni BiV ICD, ser # [id number].  Marland Kitchen CAD (coronary artery disease)    a. 05/2001 CABG x4: LIMA->LAD, VG->D1, VG->D2, VG->RCA.  . Cardiomyopathy, ischemic    a. 2012 s/p SJM 3231-40 Uni BiV ICD, ser # [id number].  . Carotid stenosis    a. 10/19/2011 carotid duplex - Mild hard plaque bilaterally. Stable 40-59% bilateral ICA stenosis. Carotid US (04/2013):  Bilateral 40-59% ICA.  F/u 1 year  . Cerumen impaction 10/28/2015  . Chronic combined systolic and diastolic CHF (congestive heart failure) (HCC)    a. EF as low as 25% in 2006;  b. EF 60-65% in 12/2011;  c. 02/2013 Echo: EF 35-40, mod mid-dist antsept HK, Gr 2 DD, mild LVH.  . CKD (chronic kidney disease), stage III   . DM (diabetes mellitus), type 2 with complications (HCC)     insulin dependent, retinopathy, neuropathy  . Dyslipidemia   . Gout ~ 08/2011  . Hypercalcemia 09/26/2013  . Hypertension   . Hypothyroidism   . Hypoxemia requiring supplemental oxygen   . LBBB (left bundle branch block)   . Morbid obesity with BMI of 45.0-49.9, adult (HCC)    Ht. 5'4". BMI 47.2  . Mural thrombus of left ventricle (HCC)    before 2003, while on Coumadin, No h/o CVA  . 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 On home oxygen therapy    "normally 2-3L; 24/7" (12/14/2015)  . OSA on CPAP   . PAF (paroxysmal atrial fibrillation) (HCC)    on Coumadin  . Presence of permanent cardiac pacemaker   . Retinopathy due to secondary diabetes ([place])    type II, uncontrolled  . Vitamin D deficiency 06/09/2012   historical    Medications:  PTA: Coumadin 2.5mg  daily except 5mg  on Sunday and Thursday.  Assessment: 75 yo F presented to ED with increased SOB and confusion x 3 days.  Pharmacy dosing PTA coumadin for a-fib. INR 3.06 on admit, peak 3.79. INR was back in therapeutic range after several held/reduced doses and warfarin was ordered for 8/24 - however, dose was not given for unclear reasons. INR now subtherapeutic 1.88 as a result.  Noted started on amio this admit and diet started 8/22. Hg low stable, plt wnl, No signs of bleeding noted.  Goal of Therapy:  INR 2-3 Monitor platelets by anticoagulation protocol: Yes   Plan:  Warfarin 4mg  x 1 dose tonight Daily INR, monitor s/sx bleeding  Elicia Lamp, PharmD, [facility] Clinical Pharmacist Pager [phone] 01/06/2016 10:31 AM

## 2016-01-06 NOTE — Progress Notes (Signed)
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 She had been unable to wear her CPAP for 3 days due to associated desaturations.  She gained about 2 lbs.  8/20 her husband could barely wake her, and sought medical assistance. In the ED she was placed on NIPPV, ABG with hypercapnia and hypoxemia, pH 7.33. No reported infectious prodrome, WBC normal. After two hours she had changed little clinically.     Subjective: 8/25 A/O 4, states uses nasal pillows at home with CPAP, secondary to claustrophobia. Patient with very poor understanding of disease process. After [facility] M spoke with patient and reinforced need for BiPAP patient finally agreed to try pulmonary test to night. Patient still absolutely refuses pulmonary rehabilitation.   Assessment & Plan:   Principal Problem:   Acute respiratory failure with hypoxia and hypercarbia (HCC) Active Problems:   Diabetes mellitus type 2 with retinopathy (HCC)   Essential hypertension   Paroxysmal atrial fibrillation (HCC)   Automatic implantable cardioverter-defibrillator in situ   OSA (obstructive sleep apnea)   History of Cardiomyopathy, ischemic   Hypothyroidism   Acute on chronic combined systolic and diastolic heart failure, NYHA class 2 (HCC)   Acute encephalopathy   Obesity hypoventilation syndrome (HCC)   Acute on chronic respiratory failure with hypoxia and hypercarbia/OSA / OHS -Per PC CM: Home w/ nocturnal NIPPV: settings will be PS 12, peep 6, rr 12 fio2 3.5  liters -Would cont diuresis as tolerated -We have set her up w/ follow up in our clinic with Dr. Elsworth Soho.  -Spoke with NCM Neoma Laming will set up [facility], PT, OT, RN, aide secondary to patient refusing any type of rehabilitation  Chronic combined systolic and diastolic CHF -TEE 8/4 showed EF 40% + RV dysfxn as well as ruptured MV chord  - wgt 122.2kg at time of d/c 8/5 -Demadex 40 mg daily -Strict in and out since admission -4.5 L -Daily weight Filed Weights   01/02/16 0430 01/03/16 0400 01/04/16 0403  Weight: 121 kg (266 lb 12.1 oz) 118.9 kg (262 lb 2 oz) 119.6 kg (263 lb 10.7 oz)  -Transfuse for hemoglobin<8 -8/4 Echocardiogram: See results below  HTN Well-controlled at this time  Parox A fib on anti-coag -Coumadin per pharmacy  - rate controlled  AICD in place  MV regurgitation  Chronic renal insufficiency(Cr at time of discharge 8/5 was 2.85) Lab Results  Component Value Date   CREATININE 2.62 (H) 01/05/2016   CREATININE 2.46 (H) 01/05/2016   CREATININE 2.58 (H) 01/04/2016  -Creatinine is stable presently ,follow trend -   Chronic anemia( baseline hemoglobin ~10) -Likely related to renal failure   Recent Labs Lab 01/01/16 1110 01/03/16 0324 01/04/16 0534 01/05/16 0213 01/06/16 0516  HGB 9.4* 9.5* 8.6* 8.9* 9.3*    DM Type 2 controlled with, complications -8/2 Hemoglobin A1c= 6.6  Hypothyroidism -8/3 TSH = 4.2  -Increase Synthroid 50 g daily recheck TSH level and 6-8 weeks  Morbid obesity - Body mass index is 45.26 kg/m. The advisability of further weight  loss has been discussed with the patient   Goals of care  -8/24 consulted palliative care: Patient with multiple hospital visits clear knees a greater level of care than can be obtained at home discuss short-term/long-term goals of care, assisted living vs palliative care vs hospice -8/25 patient ABSOLUTELY REFUSES pulmonary rehabilitation, or SNF   DVT prophylaxis: Coumadin per pharmacy Code  Status: Full code Family Communication:  Disposition Plan:    Consultants:    Procedures/Significant Events:  8/4 TTE; LEFT VENTRICLE: EF = 40%. Global HK  RIGHT VENTRICLE: Moderately HK  LEFT ATRIUM: Dilated  LEFT ATRIAL APPENDAGE: Not visualized  RIGHT ATRIUM: Dilated  AORTIC VALVE:  Trileaflet. Mildly calcified. Left coronary cusp fused. No signifcant AS/AI  MITRAL VALVE:    Valve thickened. Mild MS. Mean gradient 68mm HG. MVA 1.73cm2. Ruptured secondary on anterior leaflet of MV with mild prolapse. Mild MR.  TRICUSPID VALVE: Normal. Moderate TR  PULMONIC VALVE: Grossly normal. No PI  INTERATRIAL SEPTUM: No PFO or ASD.  PERICARDIUM: No effusion  DESCENDING AORTA:Mild plaque Cultures   Antimicrobials:    Devices AICD  LINES / TUBES:      Continuous Infusions:    Objective: Vitals:   01/05/16 1920 01/05/16 2341 01/06/16 0428 01/06/16 0900  BP: 111/61 140/68 (!) 127/58 (!) 131/97  Pulse: 72 70 70 69  Resp: 16 18 15  (!) 22  Temp: 98.3 F (36.8 C) 99 F (37.2 C) 97.8 F (36.6 C)   TempSrc: Oral Oral Axillary   SpO2: 92% 93% 99% 95%  Weight:      Height:       No intake or output data in the 24 hours ending 01/06/16 1001 Filed Weights   01/02/16 0430 01/03/16 0400 01/04/16 0403  Weight: 121 kg (266 lb 12.1 oz) 118.9 kg (262 lb 2 oz) 119.6 kg (263 lb 10.7 oz)    Examination:  General: No acute respiratory distress Eyes: negative scleral hemorrhage, negative anisocoria, negative icterus ENT: Negative Runny nose, negative gingival bleeding, Neck:  Negative scars, masses, torticollis, lymphadenopathy, JVD Lungs: Poor air movement all lung fields, difficult to assess because of patient's size, wheezes or crackles Cardiovascular: Irregular irregular rhythm and rate, without murmur gallop or rub normal S1 and S2 Abdomen: MORBIDLY OBESE, negative abdominal pain, nondistended, positive soft, bowel sounds, no rebound, no ascites, no  appreciable mass Extremities: No significant cyanosis, clubbing, or edema bilateral lower extremities Skin: Negative rashes, lesions, ulcers Psychiatric:  Very poor understanding of disease process  Central nervous system:  Cranial nerves II through XII intact, tongue/uvula midline, all extremities muscle strength 5/5, sensation intact throughout, finger nose finger bilateral within normal limits, quick finger touch bilateral within normal limits, negative Romberg sign, heel to shin bilateral within normal limits, standing on 1 foot bilateral within normal limits, walking on tiptoes within normal limits, walking on heels within normal limits, negative dysarthria, negative expressive aphasia, negative receptive aphasia.  .     Data Reviewed: Care during the described time interval was provided by me .  I have reviewed this patient's available data, including medical history, events of note, physical examination, and all test results as part of my evaluation. I have personally reviewed and interpreted all radiology studies.  CBC:  Recent Labs Lab 01/01/16 1110 01/03/16 0324 01/04/16 0534 01/05/16 0213 01/06/16 0516  WBC 6.9 8.3 6.0 5.8 4.9  NEUTROABS 4.4  --   --   --  2.9  HGB 9.4* 9.5* 8.6* 8.9* 9.3*  HCT 32.0* 32.6* 29.6*  29.0* 31.8*  MCV 101.9* 100.3* 98.7 97.6 99.4  PLT 162 165 161 154 0000000   Basic Metabolic Panel:  Recent Labs Lab 01/02/16 0325 01/03/16 0324 01/04/16 0534 01/05/16 0213 01/05/16 2357 01/06/16 0516  NA 140 139 135 134* 136  --   K 4.7 4.3 4.6 4.6 4.6  --   CL 94* 91* 89* 91* 90*  --   CO2 36* 40* 39* 36* 38*  --   GLUCOSE 78 137* 121* 127* 102*  --   BUN 55* 52* 60* 64* 65*  --   CREATININE 2.83* 2.47* 2.58* 2.46* 2.62*  --   CALCIUM 9.1 9.0 8.7* 8.5* 8.8*  --   MG  --  2.0  --   --   --  2.2  PHOS  --  4.1  --   --   --   --    GFR: Estimated Creatinine Clearance: 24.4 mL/min (by C-G formula based on SCr of 2.62 mg/dL). Liver Function  Tests:  Recent Labs Lab 01/01/16 1110 01/05/16 0213  AST 29 38  ALT 25 28  ALKPHOS 37* 38  BILITOT 0.8 1.0  PROT 6.3* 5.9*  ALBUMIN 3.4* 3.0*   No results for input(s): LIPASE, AMYLASE in the last 168 hours. No results for input(s): AMMONIA in the last 168 hours. Coagulation Profile:  Recent Labs Lab 01/02/16 0325 01/03/16 0324 01/04/16 0534 01/05/16 0213 01/06/16 0516  INR 3.14 3.51 3.79 2.78 1.88   Cardiac Enzymes: No results for input(s): CKTOTAL, CKMB, CKMBINDEX, TROPONINI in the last 168 hours. BNP (last 3 results) No results for input(s): PROBNP in the last 8760 hours. HbA1C: No results for input(s): HGBA1C in the last 72 hours. CBG:  Recent Labs Lab 01/05/16 0739 01/05/16 1154 01/05/16 1701 01/05/16 2103 01/06/16 0859  GLUCAP 118* 200* 181* 142* 108*   Lipid Profile: No results for input(s): CHOL, HDL, LDLCALC, TRIG, CHOLHDL, LDLDIRECT in the last 72 hours. Thyroid Function Tests: No results for input(s): TSH, T4TOTAL, FREET4, T3FREE, THYROIDAB in the last 72 hours. Anemia Panel: No results for input(s): VITAMINB12, FOLATE, FERRITIN, TIBC, IRON, RETICCTPCT in the last 72 hours. Urine analysis:    Component Value Date/Time   COLORURINE YELLOW 12/15/2015 0107   APPEARANCEUR CLEAR 12/15/2015 0107   LABSPEC 1.016 12/15/2015 0107   PHURINE 5.5 12/15/2015 0107   GLUCOSEU NEGATIVE 12/15/2015 0107   GLUCOSEU NEGATIVE 01/13/2009 1526   HGBUR NEGATIVE 12/15/2015 0107   HGBUR large 05/17/2008 1325   BILIRUBINUR NEGATIVE 12/15/2015 0107   KETONESUR NEGATIVE 12/15/2015 0107   PROTEINUR NEGATIVE 12/15/2015 0107   UROBILINOGEN 0.2 09/25/2014 2336   NITRITE NEGATIVE 12/15/2015 0107   LEUKOCYTESUR SMALL (A) 12/15/2015 0107   Sepsis Labs: @LABRCNTIP (procalcitonin:4,lacticidven:4)  ) Recent Results (from the past 240 hour(s))  MRSA PCR Screening     Status: None   Collection Time: 01/01/16  4:42 PM  Result Value Ref Range Status   MRSA by PCR NEGATIVE  NEGATIVE Final    Comment:        The GeneXpert MRSA Assay (FDA approved for NASAL specimens only), is one component of a comprehensive MRSA colonization surveillance program. It is not intended to diagnose MRSA infection nor to guide or monitor treatment for MRSA infections.          Radiology Studies: No results found.      Scheduled Meds: . amiodarone  200 mg Oral Daily  . atorvastatin  20 mg Oral q1800  . carvedilol  50 mg Oral BID  .  escitalopram  10 mg Oral Daily  . fenofibrate  54 mg Oral Daily  . gabapentin  300 mg Oral QHS  . insulin aspart  0-20 Units Subcutaneous TID WC  . insulin NPH Human  15 Units Subcutaneous QHS  . insulin NPH Human  20 Units Subcutaneous QAC breakfast  . levothyroxine  25 mcg Oral QAC breakfast  . LORazepam  1 mg Oral QHS  . rOPINIRole  0.25 mg Oral QHS  . sodium chloride flush  3 mL Intravenous Q12H  . torsemide  40 mg Oral Daily  . warfarin  2 mg Oral ONCE-1800  . Warfarin - Pharmacist Dosing Inpatient   Does not apply q1800   Continuous Infusions:    LOS: 5 days    Time spent: 40 minutes    Clarisa Danser, Geraldo Docker, MD [facility] Pager [phone]   If 7PM-7AM, please contact night-coverage [email or web address] Password [email or web address] 01/06/2016, 10:01 AM

## 2016-01-06 NOTE — Progress Notes (Signed)
PULMONARY / CRITICAL CARE MEDICINE   Name: Destiny [name] MRN: [id number] DOB: December 03, 1940    ADMISSION DATE:  01/01/2016 CONSULTATION DATE:  01/01/16  REFERRING MD:  Dr Cruzita Lederer  CHIEF COMPLAINT:   Assist w/ setting up home NIPPV  SUBJECTIVE:  No distress. Want to go home   VITAL SIGNS: BP (!) 146/56 (BP Location: Right Wrist)   Pulse 69   Temp 97.9 F (36.6 C) (Oral)   Resp (!) 21   Ht 5\' 4"  (1.626 m)   Wt 263 lb 10.7 oz (119.6 kg)   SpO2 95%   BMI 45.26 kg/m      INTAKE / OUTPUT: I/O last 3 completed shifts: In: 0  Out: 201 [Urine:200; Stool:1]  PHYSICAL EXAMINATION: General:  Morbidly obese woman, comfortable up in chair Neuro:  Awake, oriented. No focal def  HEENT: NCAT, no JVD  Cardiovascular:  Regular, very distant heart sounds Lungs:  Distant, no accessory use  Abdomen:  Large pannus, non-tender Musculoskeletal:  No deformities, trace pretibial edema Skin:  No rash  LABS:  BMET  Recent Labs Lab 01/04/16 0534 01/05/16 0213 01/05/16 2357  NA 135 134* 136  K 4.6 4.6 4.6  CL 89* 91* 90*  CO2 39* 36* 38*  BUN 60* 64* 65*  CREATININE 2.58* 2.46* 2.62*  GLUCOSE 121* 127* 102*    Electrolytes  Recent Labs Lab 01/03/16 0324 01/04/16 0534 01/05/16 0213 01/05/16 2357 01/06/16 0516  CALCIUM 9.0 8.7* 8.5* 8.8*  --   MG 2.0  --   --   --  2.2  PHOS 4.1  --   --   --   --     CBC  Recent Labs Lab 01/04/16 0534 01/05/16 0213 01/06/16 0516  WBC 6.0 5.8 4.9  HGB 8.6* 8.9* 9.3*  HCT 29.6* 29.0* 31.8*  PLT 161 154 176    Coag's  Recent Labs Lab 01/04/16 0534 01/05/16 0213 01/06/16 0516  INR 3.79 2.78 1.88    Sepsis Markers No results for input(s): LATICACIDVEN, PROCALCITON, O2SATVEN in the last 168 hours.  ABG  Recent Labs Lab 01/01/16 1128 01/01/16 1356 01/06/16 0458  PHART 7.326* 7.329* 7.367  PCO2ART 73.7* 76.4* 69.0*  PO2ART 57.0* 131.0* 80.0    Liver Enzymes  Recent Labs Lab 01/01/16 1110 01/05/16 0213  AST  29 38  ALT 25 28  ALKPHOS 37* 38  BILITOT 0.8 1.0  ALBUMIN 3.4* 3.0*    Cardiac Enzymes No results for input(s): TROPONINI, PROBNP in the last 168 hours.  Glucose  Recent Labs Lab 01/05/16 0739 01/05/16 1154 01/05/16 1701 01/05/16 2103 01/06/16 0859 01/06/16 1128  GLUCAP 118* 200* 181* 142* 108* 133*    Imaging No results found.   STUDIES:   CULTURES:  ANTIBIOTICS  SIGNIFICANT EVENTS:  LINES/TUBES:    ASSESSMENT / PLAN:  Acute on chronic hypercapneic and hypoxemic resp failure OSA / OHS  Secondary PAH Decompensated cor pulmonale  Acute on chronic systolic and diastolic CHF HTN A fib on anti-coag AICD in place MV regurgitation Chronic renal insufficiency Anticoagulation with coumadin DM hypothyroidism   DISCUSSION: 75 yo woman with Acute on chronic resp failure in setting acute on chronic diastolic and systolic CHF as well as OSA / OHS that appears to be under treated based on husband's report of her nocturnal saturations. Given these finding suspect the pathology here is as follows:  Under treated OSA/OHS w/ CPAP failure-->resulting in secondary PAH, then eventually decompensated cor pulmonale leading eventually to decompensated HF. She is  now better w/ volume removal and BIPAP.   Plan Home w/ nocturnal NIPPV: settings will be PS 12, peep 6, rr 12 fio2 3.5 liters Would cont diuresis as tolerated We have set her up w/ follow up in our clinic.  She can be discharged to home from our stand-point once this is arranged.   Erick Colace ACNP-BC [place] Pager # [phone] OR # [phone] if no answer   STAFF NOTE: I, Merrie Roof, MD FACP have personally reviewed patient's available data, including medical history, events of note, physical examination and test results as part of my evaluation. I have discussed with resident/NP and other care providers such as pharmacist, RN and RRT. In addition, I personally evaluated  patient and elicited key findings of: Awake in chair, no distress, good entry lung fields, without crackles, JVD wnl, CO2 baseline elevated indicating OHS component, no longer appropriate for cpap, RX BIPAP , see settings above, to follow up with Dr Elsworth Soho, and wlll need titration study as outpt likely, can dc, ensure no desat with ambulation Lavon Paganini. Titus Mould, MD, [place] Pgr: [facility] 01/06/2016 4:31 PM  \

## 2016-01-06 NOTE — Consult Note (Signed)
   [facility] CM Inpatient Consult   01/06/2016  CECI RAVENS 1941/02/04 [id number]   Patient evaluated for community based chronic disease management services with [facility] [facility] as a benefit of patient's Medicare Insurance was a re-admission.  The patient is a 75 yo morbidly obese WFPMHx Chronic Hypoxemic and Hypercapneic Resp failure,  Home 02, Chronic Combined HF, Cardiomyopathy with AICD, Parox A Fib, HTN, TEE 12/16/15 revealinga ruptured MV cord w regurgitation,  DM Type 2, CKD, Hypothyroidism per MD notes. Noted notes from the palliative care consults, as well. Patient had been previously outreached by [place] [facility] from the [facility] alert calls.  Spoke with patient at bedside today to  explain [place] [facility] services. Patient consent to post hospital follow up and signed the consent form.  Patient will receive post hospital discharge call and will be evaluated for monthly home visits for assessments and disease process education.  Left contact information and [facility] literature at bedside. Made Inpatient Case Manager aware that [facility] following. Of note, [facility] Care Management services does not replace or interfere with any services that are arranged by inpatient case management or social work.  For additional questions or referrals please contact:    Natividad Brood, RN BSN [facility] Liaison  [phone] business mobile phone Toll free office [phone]

## 2016-01-06 NOTE — Progress Notes (Signed)
Daily Progress Note   Patient Name: Destiny [name]       Date: 01/06/2016 DOB: 05/12/1941  Age: 75 y.o. MRN#: [id number] Attending Physician: Allie Bossier, MD Primary Care Physician: Penni Homans, MD Admit Date: 01/01/2016  Reason for Consultation/Follow-up: Establishing goals of care  Subjective:  on the bedside commode, husband at bedside, see discussions below:  Length of Stay: 5  Current Medications: Scheduled Meds:  . amiodarone  200 mg Oral Daily  . atorvastatin  20 mg Oral q1800  . carvedilol  50 mg Oral BID  . escitalopram  10 mg Oral Daily  . fenofibrate  54 mg Oral Daily  . gabapentin  300 mg Oral QHS  . insulin aspart  0-20 Units Subcutaneous TID WC  . insulin NPH Human  15 Units Subcutaneous QHS  . insulin NPH Human  20 Units Subcutaneous QAC breakfast  . [START ON 01/07/2016] levothyroxine  50 mcg Oral QAC breakfast  . LORazepam  1 mg Oral QHS  . rOPINIRole  0.25 mg Oral QHS  . sodium chloride flush  3 mL Intravenous Q12H  . torsemide  40 mg Oral Daily  . warfarin  2 mg Oral ONCE-1800  . Warfarin - Pharmacist Dosing Inpatient   Does not apply q1800    Continuous Infusions:    PRN Meds: sodium chloride, acetaminophen, albuterol, fluticasone, ondansetron (ZOFRAN) IV, sodium chloride flush  Physical Exam         Obese lady sitting in bedside commode NAD Awake alert s1 s2 Diminished Awake alert   Vital Signs: BP (!) 131/97 (BP Location: Right Arm)   Pulse 69   Temp 97.8 F (36.6 C) (Axillary)   Resp (!) 22   Ht 5\' 4"  (1.626 m)   Wt 119.6 kg (263 lb 10.7 oz)   SpO2 95%   BMI 45.26 kg/m  SpO2: SpO2: 95 % O2 Device: O2 Device: Nasal Cannula O2 Flow Rate: O2 Flow Rate (L/min): 4 L/min  Intake/output summary: No intake or output data in the 24 hours  ending 01/06/16 1014 LBM: Last BM Date: 01/02/16 Baseline Weight: Weight: 121.6 kg (268 lb) Most recent weight: Weight: 119.6 kg (263 lb 10.7 oz)       Palliative Assessment/Data:    Flowsheet Rows   Flowsheet Row Most Recent Value  Intake Tab  Referral Department  Hospitalist  Unit at Time of Referral  Intermediate Care Unit  Palliative Care Primary Diagnosis  Cardiac  Palliative Care Type  New Palliative care  Reason for referral  Clarify Goals of Care  Date first seen by Palliative Care  01/05/16  Clinical Assessment  Palliative Performance Scale Score  30%  Pain Max last 24 hours  3  Pain Min Last 24 hours  2  Dyspnea Max Last 24 Hours  3  Dyspnea Min Last 24 hours  2  Nausea Max Last 24 Hours  3  Nausea Min Last 24 Hours  2  Psychosocial & Spiritual Assessment  Palliative Care Outcomes  Patient/Family meeting held?  Yes  Who was at the meeting?  patient herself who is decisional.   Palliative Care Outcomes  Clarified goals of care      Patient Active Problem List   Diagnosis Date Noted  . Acute respiratory failure with hypoxia and hypercarbia ([place]) 01/01/2016  . Obesity hypoventilation syndrome ([place]) 01/01/2016  . Abdominal distention   . Debility 12/14/2015  . Acute encephalopathy 12/14/2015  . Cerumen impaction 10/28/2015  . Hypoglycemia 10/25/2014  . Medicare annual wellness visit, subsequent 09/12/2014  . Coagulopathy ([place]) 07/29/2014  . Morbid obesity ([place]) 07/29/2014  . Type 2 diabetes mellitus, controlled, with renal complications ([place]) [id number]  . Acute on chronic combined systolic and diastolic heart failure, NYHA class 2 ([place]) 07/28/2014  . Anemia 12/13/2013  . DOE (dyspnea on exertion) 11/30/2013  . Edema 09/26/2013  . Hypercalcemia 09/26/2013  . Encounter for therapeutic drug monitoring 06/10/2013  . Chronic systolic heart failure ([place]) 05/01/2013  . Chronic combined systolic and diastolic CHF (congestive heart failure) ([place]) 05/01/2013  .  Physical deconditioning 03/25/2013  . Hyperkalemia 03/12/2013  . Hypothyroidism 03/06/2013  . Vitamin D deficiency 06/09/2012  . History of Cardiomyopathy, ischemic   . OSA (obstructive sleep apnea) 09/05/2010  . LEG PAIN, BILATERAL 12/28/2009  . Diabetes mellitus type 2 with retinopathy ([place]) 10/28/2009  . Paroxysmal atrial fibrillation ([place]) 06/21/2009  . NHL (non-Hodgkin's lymphoma) ([place]) 04/21/2009  . MURAL THROMBUS, LEFT VENTRICLE 03/07/2009  . LYMPHEDEMA 03/07/2009  . LBBB 08/26/2008  . CVA 08/26/2008  . BACK PAIN, CHRONIC 08/26/2008  . Automatic implantable cardioverter-defibrillator in situ 08/26/2008  . UTI'S, RECURRENT 06/24/2008  . Carotid stenosis 05/20/2007  . Hyperlipidemia 03/19/2007  . Essential hypertension 11/20/2006    Palliative Care Assessment & Plan   Patient Profile:    Assessment:  acute on chronic combined systolic and diastolic heart failure OSA OHS Chronic state of hypercapnea Has AICD   Recommendations/Plan:  Family meeting with patient and husband in the patient's room this morning. Trajectories of her illness discussed. Goals and wishes elicited: remains full code, continue with med titration and AICD, needs home BiPAP( d/w case management), needs to f/u with Dr Elsworth Soho and Dr Haroldine Laws in the out patient setting, does not want SNF rehab, husband states he is her 24/7 caregiver. He is accepting of home health care and home PT. He is asking for PT consult here. She needs higher concentrator for her O2 at home.   No additional palliative recommendations at this time, goals are for any and all life maintaining life prolonging therapies to continue.    Code Status:    Code Status Orders        Start     Ordered   01/01/16 1545  Full code  Continuous     01/01/16 1544    Code Status  History    Date Active Date Inactive Code Status Order ID Comments User Context   12/14/2015  8:42 PM 12/17/2015  5:43 PM Full Code [id number]  Toy Baker, MD  Inpatient   03/12/2015  2:54 AM 03/13/2015  4:20 PM Full Code [id number]  Edwin Dada, MD Inpatient   11/27/2014 12:18 AM 11/28/2014  6:15 PM Full Code [id number]  Etta Quill, DO ED   10/25/2014  1:22 AM 10/29/2014  5:45 PM Full Code [id number]  Lavina Hamman, MD ED   09/26/2014  2:32 AM 09/30/2014  2:20 PM Full Code [id number]  Deneise Lever, MD ED   07/29/2014  1:33 AM 07/31/2014  5:48 PM Full Code [id number]  Rise Patience, MD Inpatient   03/25/2013  8:55 AM 04/01/2013  3:16 PM Partial Code [id number]  Bary Leriche, PA-C Inpatient   03/24/2013  5:43 PM 03/25/2013  8:52 AM DNR [id number]  Bary Leriche, PA-C Inpatient   03/20/2013 12:18 PM 03/24/2013  5:43 PM DNR [id number]  Wilhelmina Mcardle, MD Inpatient   03/05/2013  9:19 PM 03/20/2013 12:18 PM Full Code [id number]  Phillips Grout, MD Inpatient   12/27/2011  4:48 PM 12/28/2011  4:57 PM Full Code [id number]  Erline Hau, MD Inpatient       Prognosis:   Unable to determine  Discharge Planning:  Home with [place] was discussed with  Patient, patient's husband, bedside RN  Thank you for allowing the Palliative Medicine Team to assist in the care of this patient.   Time In: 9 Time Out: 925 Total Time 25 Prolonged Time Billed  no       Greater than 50%  of this time was spent counseling and coordinating care related to the above assessment and plan.  Loistine Chance, MD [id number] Please contact Palliative Medicine Team phone at [phone] for questions and concerns.

## 2016-01-06 NOTE — Care Management Note (Signed)
Case Management Note  Patient Details  Name: Destiny [name] MRN: [id number] Date of Birth: 25-Apr-1941  Subjective/Objective:    Patient lives at home with spouse, she has a home cpap machine, a rolling walker, a motorized scooter,  And a transporter chair.  Patient states it was some years ago when she had a sleep study done.  Patient is on 3 liters of oxygen with [employer] at home.  She has pcp, and transportation at dc. Patient will be with [facility] for [place] for [facility], PT, OT, and aide, was discussed with med director, referral made to [facility] with [facility].  Also referral for bipap faxed to [place] with order.   Oxsimetry will be done Friday night and weekend NCM will need to fax this information to [place].                    Action/Plan:   Expected Discharge Date:                  Expected Discharge Plan:  [place]  In-House Referral:     Discharge planning Services  CM Consult  Post Acute Care Choice:  Durable Medical Equipment, Home Health Choice offered to:  Patient  DME Arranged:    DME Agency:     HH Arranged:  RN, PT, OT, Nurse's Aide [place] Agency:  [employer])  Status of Service:  In process, will continue to follow  If discussed at Long Length of Stay Meetings, dates discussed:    Additional Comments:  [name], RN 01/06/2016, 4:49 PM

## 2016-01-07 LAB — BASIC METABOLIC PANEL
ANION GAP: 8 (ref 5–15)
BUN: 63 mg/dL — ABNORMAL HIGH (ref 6–20)
CHLORIDE: 94 mmol/L — AB (ref 101–111)
CO2: 35 mmol/L — AB (ref 22–32)
CREATININE: 2.75 mg/dL — AB (ref 0.44–1.00)
Calcium: 8.9 mg/dL (ref 8.9–10.3)
GFR calc non Af Amer: 16 mL/min — ABNORMAL LOW (ref >60)
GFR, EST AFRICAN AMERICAN: 19 mL/min — AB (ref >60)
Glucose, Bld: 173 mg/dL — ABNORMAL HIGH (ref 65–99)
POTASSIUM: 4.7 mmol/L (ref 3.5–5.1)
SODIUM: 137 mmol/L (ref 135–145)

## 2016-01-07 LAB — CBC WITH DIFFERENTIAL/PLATELET
BASOS PCT: 0 %
Basophils Absolute: 0 10*3/uL (ref 0.0–0.1)
Eosinophils Absolute: 0.1 10*3/uL (ref 0.0–0.7)
Eosinophils Relative: 3 %
HEMATOCRIT: 29.8 % — AB (ref 36.0–46.0)
HEMOGLOBIN: 8.6 g/dL — AB (ref 12.0–15.0)
LYMPHS ABS: 1 10*3/uL (ref 0.7–4.0)
Lymphocytes Relative: 21 %
MCH: 29.2 pg (ref 26.0–34.0)
MCHC: 28.9 g/dL — AB (ref 30.0–36.0)
MCV: 101 fL — ABNORMAL HIGH (ref 78.0–100.0)
MONOS PCT: 17 %
Monocytes Absolute: 0.8 10*3/uL (ref 0.1–1.0)
NEUTROS ABS: 2.6 10*3/uL (ref 1.7–7.7)
NEUTROS PCT: 59 %
Platelets: 172 10*3/uL (ref 150–400)
RBC: 2.95 MIL/uL — ABNORMAL LOW (ref 3.87–5.11)
RDW: 16.3 % — ABNORMAL HIGH (ref 11.5–15.5)
WBC: 4.5 10*3/uL (ref 4.0–10.5)

## 2016-01-07 LAB — GLUCOSE, CAPILLARY
GLUCOSE-CAPILLARY: 168 mg/dL — AB (ref 65–99)
Glucose-Capillary: 118 mg/dL — ABNORMAL HIGH (ref 65–99)
Glucose-Capillary: 124 mg/dL — ABNORMAL HIGH (ref 65–99)
Glucose-Capillary: 140 mg/dL — ABNORMAL HIGH (ref 65–99)
Glucose-Capillary: 174 mg/dL — ABNORMAL HIGH (ref 65–99)

## 2016-01-07 LAB — PROTIME-INR
INR: 1.55
INR: 1.47
PROTHROMBIN TIME: 17.9 s — AB (ref 11.4–15.2)
Prothrombin Time: 18.8 seconds — ABNORMAL HIGH (ref 11.4–15.2)

## 2016-01-07 LAB — MAGNESIUM: MAGNESIUM: 2 mg/dL (ref 1.7–2.4)

## 2016-01-07 MED ORDER — WARFARIN SODIUM 4 MG PO TABS
4.0000 mg | ORAL_TABLET | Freq: Once | ORAL | Status: DC
  Filled 2016-01-07: qty 1

## 2016-01-07 MED ORDER — WARFARIN SODIUM 5 MG PO TABS
2.5000 mg | ORAL_TABLET | Freq: Every day | ORAL | Status: DC
  Administered 2016-01-07 – 2016-01-09 (×3): 2.5 mg via ORAL
  Filled 2016-01-07 (×3): qty 1

## 2016-01-07 NOTE — Progress Notes (Signed)
Patient placed on bipap for hs per md order.  Settings titrated up to 16/8 for pt comfort and adequate VT.  Fi02 50% with sats 90-94%.  Bipap started upon completion of oximetry monitoring off of bipap.

## 2016-01-07 NOTE — Progress Notes (Signed)
Patients nocturnal sleep record took place from 2300 on 01/06/2016 and ended at 0100 on 01/07/2016. The patient remained on 3.5 liters of oxygen for the whole period of time due to the fact that she uses 2 liters of oxygen at home and has been requiring 3.5 liters while in the hospital. RN documented all desaturations 88% or less during period, which totaled 20 minutes in 2 hours. Of that 20 minutes, there was a period of 5 consecutive minutes of desaturation. Patient was very restless throughout the entire 2 hour period. Respiratory therapy will start patient on Bipap and RN will continue to monitor patient.

## 2016-01-07 NOTE — Progress Notes (Signed)
ANTICOAGULATION CONSULT NOTE - Follow-Up Consult   Pharmacy Consult for Warfarin Indication: atrial fibrillation  Allergies  Allergen Reactions  . Sulfonamide Derivatives Other (See Comments)    Unknown; "childhood allergy/mother"    Patient Measurements: Height: 5\' 4"  (162.6 cm) Weight: 262 lb 8 oz (119.1 kg) IBW/kg (Calculated) : 54.7  Vital Signs: Temp: 98 F (36.7 C) (08/26 1100) Temp Source: Oral (08/26 1100) BP: 129/60 (08/26 1100) Pulse Rate: 70 (08/26 1100)  Labs:  Recent Labs  01/05/16 0213 01/05/16 2357 01/06/16 0516 01/07/16 0451  HGB 8.9*  --  9.3* 8.6*  HCT 29.0*  --  31.8* 29.8*  PLT 154  --  176 172  LABPROT 29.9*  --  21.9* 18.8*  INR 2.78  --  1.88 1.55  CREATININE 2.46* 2.62*  --   --     Estimated Creatinine Clearance: 24.3 mL/min (by C-G formula based on SCr of 2.62 mg/dL).   Medical History: Past Medical History:  Diagnosis Date  . AICD (automatic cardioverter/defibrillator) present   . Anemia, unspecified 12/13/2013  . Back pain, chronic    "just when I walk; mass on 3rd and 4th vertebrae right lower back"  . Biventricular implantable cardiac defibrillator in situ 2007, 2012   a. 2007;  b. 2012 Gen change: SJM 3231-40 Uni BiV ICD, ser # [id number].  Marland Kitchen CAD (coronary artery disease)    a. 05/2001 CABG x4: LIMA->LAD, VG->D1, VG->D2, VG->RCA.  . Cardiomyopathy, ischemic    a. 2012 s/p SJM 3231-40 Uni BiV ICD, ser # [id number].  . Carotid stenosis    a. 10/19/2011 carotid duplex - Mild hard plaque bilaterally. Stable 40-59% bilateral ICA stenosis. Carotid US (04/2013):  Bilateral 40-59% ICA.  F/u 1 year  . Cerumen impaction 10/28/2015  . Chronic combined systolic and diastolic CHF (congestive heart failure) (HCC)    a. EF as low as 25% in 2006;  b. EF 60-65% in 12/2011;  c. 02/2013 Echo: EF 35-40, mod mid-dist antsept HK, Gr 2 DD, mild LVH.  . CKD (chronic kidney disease), stage III   . DM (diabetes mellitus), type 2 with complications (HCC)    insulin  dependent, retinopathy, neuropathy  . Dyslipidemia   . Gout ~ 08/2011  . Hypercalcemia 09/26/2013  . Hypertension   . Hypothyroidism   . Hypoxemia requiring supplemental oxygen   . LBBB (left bundle branch block)   . Morbid obesity with BMI of 45.0-49.9, adult (HCC)    Ht. 5'4". BMI 47.2  . Mural thrombus of left ventricle (HCC)    before 2003, while on Coumadin, No h/o CVA  . 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 Benay Spice, in remission  . On home oxygen therapy    "normally 2-3L; 24/7" (12/14/2015)  . OSA on CPAP   . PAF (paroxysmal atrial fibrillation) (HCC)    on Coumadin  . Presence of permanent cardiac pacemaker   . Retinopathy due to secondary diabetes ([place])    type II, uncontrolled  . Vitamin D deficiency 06/09/2012   historical    Medications:  PTA: Coumadin 2.5mg  daily except 5mg  on Sunday and Thursday.  Assessment: 75 yo F presented to ED with increased SOB and confusion x 3 days.  Pharmacy dosing PTA coumadin for a-fib. INR 3.06 on admit, peak 3.79. INR was back in therapeutic range after several held/reduced doses and warfarin was ordered for 8/24 - however, dose was not given for unclear reasons. INR now subtherapeutic- 1.55 after 4 mg  dose last night (8/25)- likely still reflective of missed dose.   Noted started on amio this admit and diet started 8/22. Hg low stable, plt wnl, No signs of bleeding noted.  Goal of Therapy:  INR 2-3 Monitor platelets by anticoagulation protocol: Yes   Plan:  Warfarin 2.5 mg  Recommend warfarin 2.5 mg daily until patient can be followed up by an outpatient provider.  Daily INR, monitor s/sx bleeding  Jearld Fenton D Pharmacy Resident Pager [phone] 01/07/2016 12:21 PM

## 2016-01-07 NOTE — Progress Notes (Signed)
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 She had been unable to wear her CPAP for 3 days due to associated desaturations.  She gained about 2 lbs.  8/20 her husband could barely wake her, and sought medical assistance. In the ED she was placed on NIPPV, ABG with hypercapnia and hypoxemia, pH 7.33. No reported infectious prodrome, WBC normal. After two hours she had changed little clinically.     Subjective: 8/26 A/O 4, patient has been state breathing significantly improved. Sitting comfortably in chair on 2 L O2 via [place]. Patient states has oxygen concentrator at home which goes up to 5 L but does not have a oxygen tank.   Assessment & Plan:   Principal Problem:   Acute on chronic respiratory failure with hypoxia and hypercapnia (HCC) Active Problems:   Diabetes mellitus type 2 with retinopathy (HCC)   Essential hypertension   Paroxysmal atrial fibrillation (HCC)   Automatic implantable cardioverter-defibrillator in situ   OSA (obstructive sleep apnea)   History of Cardiomyopathy, ischemic   Hypothyroidism   Acute on chronic combined systolic and diastolic heart failure, NYHA class 2 (HCC)   Acute encephalopathy   Obesity hypoventilation syndrome (HCC)   Acute on chronic respiratory failure with hypoxia and hypercarbia/OSA / OHS -Per PC CM: Home w/ nocturnal NIPPV: settings will be PS 12, peep 6, rr 12 fio2 3.5 liters -Would cont diuresis as tolerated -We have set her up w/ follow up in our clinic with Dr.  Elsworth Soho.  -Spoke with NCM Neoma Laming will set up HRI/THN, PT, OT, RN, aide secondary to patient refusing any type of rehabilitation  Chronic combined systolic and diastolic CHF -TEE 8/4 showed EF 40% + RV dysfxn as well as ruptured MV chord  - wgt 122.2kg at time of d/c 8/5 -Demadex 40 mg daily -Strict in and out since admission -4.4 L -Daily weight Filed Weights   01/03/16 0400 01/04/16 0403 01/07/16 0341  Weight: 118.9 kg (262 lb 2 oz) 119.6 kg (263 lb 10.7 oz) 119.1 kg (262 lb 8 oz)  -Transfuse for hemoglobin<8 -8/4 Echocardiogram: See results below  HTN Well-controlled at this time  Parox A fib on anti-coag -Coumadin per pharmacy  - rate controlled Recent Labs Lab 01/04/16 0534 01/05/16 0213 01/06/16 0516 01/07/16 0451 01/07/16 1853  INR 3.79 2.78 1.88 1.55 1.47   AICD in place  MV regurgitation  Chronic renal insufficiency(Cr at time of discharge 8/5 was 2.85) Lab Results  Component Value Date   CREATININE 2.75 (H) 01/07/2016   CREATININE 2.62 (H) 01/05/2016   CREATININE 2.46 (H) 01/05/2016  -Creatinine is stable presently ,follow trend -   Chronic anemia( baseline hemoglobin ~10) -Likely related to renal failure   Recent Labs Lab 01/03/16 0324 01/04/16 0534 01/05/16 0213 01/06/16 0516 01/07/16 0451  HGB 9.5* 8.6* 8.9* 9.3* 8.6*    DM Type 2 controlled with, complications -8/2 Hemoglobin A1c= 6.6  Hypothyroidism -8/3 TSH = 4.2  -Increase Synthroid 50 g daily recheck TSH level and 6-8 weeks  Morbid obesity - Body mass index is 45.26 kg/m. The advisability of further weight loss has been discussed with the patient   Goals of care  -8/24 consulted palliative care: Patient with multiple hospital visits clear knees a greater level of care than can be obtained at home discuss short-term/long-term goals of care, assisted living vs palliative care vs hospice -8/25 patient ABSOLUTELY REFUSES pulmonary rehabilitation, or SNF   DVT prophylaxis:  Coumadin per pharmacy Code Status: Full code Family Communication:  Disposition Plan:    Consultants:    Procedures/Significant Events:  8/4 TTE; -LVEF 40%. Global HK-RT VENTRICLE: Moderately HK -LEFT/ RIGHTATRIUM: Dilated -MITRAL VALVE:   Ruptured secondary on anterior leaflet of MV with mild prolapse. Mild MR. -TRICUSPID VALVE: Moderate regurgitation  Cultures   Antimicrobials:    Devices AICD  LINES / TUBES:      Continuous Infusions:    Objective: Vitals:   01/07/16 0341 01/07/16 0700 01/07/16 1100 01/07/16 1500  BP: 132/66 122/68 129/60 128/71  Pulse: 73 71 70 72  Resp: (!) 21 17 (!) 22 15  Temp: 98.7 F (37.1 C) 97.9 F (36.6 C) 98 F (36.7 C) 98 F (36.7 C)  TempSrc: Axillary Oral Oral Axillary  SpO2: 99%     Weight: 119.1 kg (262 lb 8 oz)     Height:        Intake/Output Summary (Last 24 hours) at 01/07/16 1810 Last data filed at 01/07/16 1752  Gross per 24 hour  Intake              843 ml  Output              375 ml  Net              468 ml   Filed Weights   01/03/16 0400 01/04/16 0403 01/07/16 0341  Weight: 118.9 kg (262 lb 2 oz) 119.6 kg (263 lb 10.7 oz) 119.1 kg (262 lb 8 oz)    Examination:  General: No acute respiratory distress Eyes: negative scleral hemorrhage, negative anisocoria, negative icterus ENT: Negative Runny nose, negative gingival bleeding, Neck:  Negative scars, masses, torticollis, lymphadenopathy, JVD Lungs: Poor air movement all lung fields, difficult to assess because of patient's size, wheezes or crackles Cardiovascular: Irregular irregular rhythm and rate, without murmur gallop or rub normal S1 and S2 Abdomen: MORBIDLY OBESE, negative abdominal pain, nondistended, positive soft, bowel sounds, no rebound, no ascites, no appreciable mass Extremities: No significant cyanosis, clubbing, or edema bilateral lower extremities Skin: Negative rashes, lesions, ulcers Psychiatric:  Very poor understanding of disease  process  Central nervous system:  Cranial nerves II through XII intact, tongue/uvula midline, all extremities muscle strength 5/5, sensation intact throughout, finger nose finger bilateral within normal limits, quick finger touch bilateral within normal limits, negative Romberg sign, heel to shin bilateral within normal limits, standing on 1 foot bilateral within normal limits, walking on tiptoes within normal limits, walking on heels within normal limits, negative dysarthria, negative expressive aphasia, negative receptive aphasia.  .     Data Reviewed: Care during the described time interval was provided by me .  I have reviewed this patient's available data, including medical history, events of note, physical examination, and all test results as part of my evaluation. I have personally reviewed and interpreted all radiology studies.  CBC:  Recent Labs Lab 01/01/16 1110 01/03/16 0324 01/04/16 0534 01/05/16 0213 01/06/16 0516 01/07/16 0451  WBC 6.9 8.3 6.0 5.8 4.9 4.5  NEUTROABS 4.4  --   --   --  2.9 2.6  HGB 9.4* 9.5* 8.6* 8.9* 9.3* 8.6*  HCT 32.0* 32.6* 29.6* 29.0* 31.8* 29.8*  MCV 101.9* 100.3* 98.7 97.6 99.4 101.0*  PLT 162 165 161 154 176 [id number]   Basic Metabolic Panel:  Recent Labs Lab 01/02/16 0325 01/03/16 0324 01/04/16 0534 01/05/16 0213 01/05/16 2357 01/06/16 0516  NA 140 139 135 134* 136  --   K 4.7 4.3 4.6 4.6 4.6  --   CL 94* 91* 89* 91* 90*  --   CO2 36* 40* 39* 36* 38*  --   GLUCOSE 78 137* 121* 127* 102*  --   BUN 55* 52* 60* 64* 65*  --   CREATININE 2.83* 2.47* 2.58* 2.46* 2.62*  --   CALCIUM 9.1 9.0 8.7* 8.5* 8.8*  --   MG  --  2.0  --   --   --  2.2  PHOS  --  4.1  --   --   --   --    GFR: Estimated Creatinine Clearance: 24.3 mL/min (by C-G formula based on SCr of 2.62 mg/dL). Liver Function Tests:  Recent Labs Lab 01/01/16 1110 01/05/16 0213  AST 29 38  ALT 25 28  ALKPHOS 37* 38  BILITOT 0.8 1.0  PROT 6.3* 5.9*  ALBUMIN 3.4* 3.0*   No  results for input(s): LIPASE, AMYLASE in the last 168 hours. No results for input(s): AMMONIA in the last 168 hours. Coagulation Profile:  Recent Labs Lab 01/03/16 0324 01/04/16 0534 01/05/16 0213 01/06/16 0516 01/07/16 0451  INR 3.51 3.79 2.78 1.88 1.55   Cardiac Enzymes: No results for input(s): CKTOTAL, CKMB, CKMBINDEX, TROPONINI in the last 168 hours. BNP (last 3 results) No results for input(s): PROBNP in the last 8760 hours. HbA1C: No results for input(s): HGBA1C in the last 72 hours. CBG:  Recent Labs Lab 01/06/16 2146 01/06/16 2226 01/07/16 0815 01/07/16 1116 01/07/16 1720  GLUCAP 156* 152* 118* 124* 140*   Lipid Profile: No results for input(s): CHOL, HDL, LDLCALC, TRIG, CHOLHDL, LDLDIRECT in the last 72 hours. Thyroid Function Tests: No results for input(s): TSH, T4TOTAL, FREET4, T3FREE, THYROIDAB in the last 72 hours. Anemia Panel: No results for input(s): VITAMINB12, FOLATE, FERRITIN, TIBC, IRON, RETICCTPCT in the last 72 hours. Urine analysis:    Component Value Date/Time   COLORURINE YELLOW 12/15/2015 0107   APPEARANCEUR CLEAR 12/15/2015 0107   LABSPEC 1.016 12/15/2015 0107   PHURINE 5.5 12/15/2015 0107   GLUCOSEU NEGATIVE 12/15/2015 0107   GLUCOSEU NEGATIVE 01/13/2009 1526   HGBUR NEGATIVE 12/15/2015 0107   HGBUR large 05/17/2008 1325   BILIRUBINUR NEGATIVE 12/15/2015 0107   KETONESUR NEGATIVE 12/15/2015 0107   PROTEINUR NEGATIVE 12/15/2015 0107   UROBILINOGEN 0.2 09/25/2014 2336   NITRITE NEGATIVE 12/15/2015 0107   LEUKOCYTESUR SMALL (A) 12/15/2015 0107   Sepsis Labs: @LABRCNTIP (procalcitonin:4,lacticidven:4)  ) Recent Results (from the past 240 hour(s))  MRSA PCR Screening     Status: None   Collection Time: 01/01/16  4:42 PM  Result Value Ref Range Status   MRSA by PCR NEGATIVE NEGATIVE Final    Comment:        The GeneXpert MRSA Assay (FDA approved for NASAL specimens only), is one component of a comprehensive MRSA  colonization surveillance program. It is not intended to diagnose MRSA infection nor to guide or monitor treatment for MRSA infections.          Radiology Studies: No results found.      Scheduled Meds: . amiodarone  200 mg Oral  Daily  . atorvastatin  20 mg Oral q1800  . carvedilol  50 mg Oral BID  . escitalopram  10 mg Oral Daily  . fenofibrate  54 mg Oral Daily  . gabapentin  300 mg Oral QHS  . insulin aspart  0-20 Units Subcutaneous TID WC  . insulin NPH Human  15 Units Subcutaneous QHS  . insulin NPH Human  20 Units Subcutaneous QAC breakfast  . levothyroxine  50 mcg Oral QAC breakfast  . LORazepam  1 mg Oral QHS  . rOPINIRole  0.25 mg Oral QHS  . sodium chloride flush  3 mL Intravenous Q12H  . torsemide  40 mg Oral Daily  . warfarin  2.5 mg Oral q1800  . Warfarin - Pharmacist Dosing Inpatient   Does not apply q1800   Continuous Infusions:    LOS: 6 days    Time spent: 40 minutes    WOODS, Geraldo Docker, MD [facility] Pager [phone]   If 7PM-7AM, please contact night-coverage [email or web address] Password [email or web address] 01/07/2016, 6:10 PM

## 2016-01-08 DIAGNOSIS — E039 Hypothyroidism, unspecified: Secondary | ICD-10-CM

## 2016-01-08 LAB — GLUCOSE, CAPILLARY
GLUCOSE-CAPILLARY: 98 mg/dL (ref 65–99)
Glucose-Capillary: 141 mg/dL — ABNORMAL HIGH (ref 65–99)
Glucose-Capillary: 146 mg/dL — ABNORMAL HIGH (ref 65–99)
Glucose-Capillary: 138 mg/dL — ABNORMAL HIGH (ref 65–99)

## 2016-01-08 LAB — PROTIME-INR
INR: 1.51
PROTHROMBIN TIME: 18.4 s — AB (ref 11.4–15.2)

## 2016-01-08 LAB — CBC WITH DIFFERENTIAL/PLATELET
Basophils Absolute: 0 10*3/uL (ref 0.0–0.1)
Basophils Relative: 0 %
EOS ABS: 0.2 10*3/uL (ref 0.0–0.7)
EOS PCT: 3 %
HCT: 31 % — ABNORMAL LOW (ref 36.0–46.0)
Hemoglobin: 9 g/dL — ABNORMAL LOW (ref 12.0–15.0)
LYMPHS ABS: 1.1 10*3/uL (ref 0.7–4.0)
Lymphocytes Relative: 22 %
MCH: 29.3 pg (ref 26.0–34.0)
MCHC: 29 g/dL — AB (ref 30.0–36.0)
MCV: 101 fL — ABNORMAL HIGH (ref 78.0–100.0)
MONO ABS: 0.8 10*3/uL (ref 0.1–1.0)
Monocytes Relative: 16 %
Neutro Abs: 3.1 10*3/uL (ref 1.7–7.7)
Neutrophils Relative %: 59 %
PLATELETS: 172 10*3/uL (ref 150–400)
RBC: 3.07 MIL/uL — AB (ref 3.87–5.11)
RDW: 16.4 % — AB (ref 11.5–15.5)
WBC: 5.3 10*3/uL (ref 4.0–10.5)

## 2016-01-08 LAB — BASIC METABOLIC PANEL
Anion gap: 7 (ref 5–15)
BUN: 62 mg/dL — AB (ref 6–20)
CALCIUM: 8.8 mg/dL — AB (ref 8.9–10.3)
CO2: 37 mmol/L — ABNORMAL HIGH (ref 22–32)
CREATININE: 2.42 mg/dL — AB (ref 0.44–1.00)
Chloride: 95 mmol/L — ABNORMAL LOW (ref 101–111)
GFR calc Af Amer: 22 mL/min — ABNORMAL LOW (ref >60)
GFR calc non Af Amer: 19 mL/min — ABNORMAL LOW (ref >60)
GLUCOSE: 111 mg/dL — AB (ref 65–99)
POTASSIUM: 4.3 mmol/L (ref 3.5–5.1)
SODIUM: 139 mmol/L (ref 135–145)

## 2016-01-08 LAB — MAGNESIUM: Magnesium: 2 mg/dL (ref 1.7–2.4)

## 2016-01-08 NOTE — Progress Notes (Signed)
SATURATION QUALIFICATIONS: (This note is used to comply with regulatory documentation for home oxygen)  Patient Saturations on Room Air at Rest = 80%  Patient Saturations on Room Air while Ambulating = Unable to assess  Patient Saturations on 3 Liters of oxygen while Ambulating = 88%  Please briefly explain why patient needs home oxygen:Patients saturation dropped quickly to 80% while on room air.  We did not attempt ambulation while on room air due to risk to patient.  Ambulated patient on 3L patient unable to maintain 02 saturation while ambulating.

## 2016-01-08 NOTE — Evaluation (Signed)
Physical Therapy Evaluation Patient Details Name: Destiny [name] MRN: [id number] DOB: 11/21/1940 Today's Date: 01/08/2016   History of Present Illness  Patient is a 75 yo female admitted 01/01/16 with acute hypoxic resp failure and hypercapnia, and AMS.    PMH:  CHF, morbid obesity, COPD on home O2, chronic hypoxemia/resp failure, OSA on CPAP, ICM, AICD, PAF, HTN, DM  Clinical Impression  Patient presents with problems listed below.  Will benefit from acute PT to maximize functional mobility prior to discharge home with husband.  Per husband, patient generally ambulates in the house up to 49' at a time.  Today, patient able to ambulate 30' before fatigued.  Recommend HHPT at d/c for continued therapy.    Follow Up Recommendations Home health PT;Supervision for mobility/OOB    Equipment Recommendations  3in1 (PT) (Needs wide/bariatric equipment)    Recommendations for Other Services       Precautions / Restrictions Precautions Precautions: Fall Precaution Comments: On O2 Restrictions Weight Bearing Restrictions: No      Mobility  Bed Mobility               General bed mobility comments: Patient in chair  Transfers Overall transfer level: Needs assistance Equipment used: Rolling walker (2 wheeled) Transfers: Sit to/from Stand Sit to Stand: Min assist         General transfer comment: Verbal cues for hand placement.  Assist for safety/balance, and to steady during transfers.  Ambulation/Gait Ambulation/Gait assistance: Min assist;+2 safety/equipment Ambulation Distance (Feet): 40 Feet (10' and 30' with 1 sitting rest break) Assistive device: Rolling walker (2 wheeled) Gait Pattern/deviations: Step-through pattern;Decreased step length - right;Decreased step length - left;Decreased stride length;Shuffle;Wide base of support Gait velocity: decreased Gait velocity interpretation: Below normal speed for age/gender General Gait Details: Patient demonstrates safe use of  RW.  Assist for safety/balance.  Fatigues very quickly.  Chair behind patient during gait for safety.  O2 sats dropped to 87% during gait on O2.  Returned to 96% with rest.  Stairs            Wheelchair Mobility    Modified Rankin (Stroke Patients Only)       Balance Overall balance assessment: Needs assistance         Standing balance support: Bilateral upper extremity supported Standing balance-Leahy Scale: Poor                               Pertinent Vitals/Pain Pain Assessment: No/denies pain    Home Living Family/patient expects to be discharged to:: Private residence Living Arrangements: Spouse/significant other Available Help at Discharge: Family;Available 24 hours/day Type of Home: House Home Access: Ramped entrance     Home Layout: One level Home Equipment: Walker - 4 wheels;Grab bars - tub/shower;Hand held shower head;Shower seat;Electric scooter;Walker - 2 wheels;Wheelchair - manual;Cane - single point      Prior Function Level of Independence: Independent with assistive device(s)         Comments: Ind ambulating household distances with 4 wheeled walker in home, scooter in community.  Dresses and showers Ind.       Hand Dominance   Dominant Hand: Right    Extremity/Trunk Assessment   Upper Extremity Assessment: Generalized weakness           Lower Extremity Assessment: Generalized weakness         Communication   Communication: No difficulties  Cognition Arousal/Alertness: Awake/alert Behavior During Therapy: WFL for  tasks assessed/performed Overall Cognitive Status: Within Functional Limits for tasks assessed                      General Comments      Exercises        Assessment/Plan    PT Assessment Patient needs continued PT services  PT Diagnosis Difficulty walking;Generalized weakness   PT Problem List Decreased strength;Decreased activity tolerance;Decreased balance;Decreased  mobility;Decreased knowledge of use of DME;Cardiopulmonary status limiting activity;Obesity  PT Treatment Interventions DME instruction;Gait training;Functional mobility training;Therapeutic activities;Therapeutic exercise;Patient/family education   PT Goals (Current goals can be found in the Care Plan section) Acute Rehab PT Goals Patient Stated Goal: to go home soon PT Goal Formulation: With patient/family Time For Goal Achievement: 01/15/16 Potential to Achieve Goals: Good    Frequency Min 3X/week   Barriers to discharge        Co-evaluation               End of Session Equipment Utilized During Treatment: Gait belt;Oxygen Activity Tolerance: Patient limited by fatigue Patient left: in chair;with call bell/phone within reach;with family/visitor present Nurse Communication: Mobility status         Time: [id number] PT Time Calculation (min) (ACUTE ONLY): 15 min   Charges:   PT Evaluation $PT Eval High Complexity: 1 Procedure     PT G CodesDespina Pole 01/14/16, 5:10 PM Carita Pian. Sanjuana Kava, [place] Pager [phone]

## 2016-01-08 NOTE — Progress Notes (Addendum)
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 She had been unable to wear her CPAP for 3 days due to associated desaturations.  She gained about 2 lbs.  8/20 her husband could barely wake her, and sought medical assistance. In the ED she was placed on NIPPV, ABG with hypercapnia and hypoxemia, pH 7.33. No reported infectious prodrome, WBC normal. After two hours she had changed little clinically.     Subjective: 8/27 A/O 4, patient has been state breathing significantly improved. Sitting comfortably in chair on 2 L O2 via [place]. Patient states has oxygen concentrator at home which goes up to 5 L but does not have a oxygen tank. Per husband tank is too heavy for her to use. States Dr. Elsworth Soho never instructed them that she needed BiPAP or to return to [facility] in 2016. Per husband patient was imperfect health had no CO2 retention problems until last month.    Assessment & Plan:   Principal Problem:   Acute on chronic respiratory failure with hypoxia and hypercapnia (HCC) Active Problems:   Diabetes mellitus type 2 with retinopathy (HCC)   Essential hypertension   Paroxysmal atrial fibrillation (HCC)   Automatic implantable cardioverter-defibrillator in situ   OSA (obstructive sleep apnea)   History of Cardiomyopathy, ischemic   Hypothyroidism   Acute on chronic combined systolic and diastolic heart failure, NYHA class 2 (HCC)   Acute encephalopathy   Obesity hypoventilation syndrome (HCC)   Acute on chronic respiratory failure  with hypoxia and hypercarbia/OSA / OHS/COPD -Per PC CM: Home w/ nocturnal NIPPV: settings will be PS 12, peep 6, rr 12 fio2 3.5 liters -Would cont diuresis as tolerated -We have set her up w/ follow up in our clinic with Dr. Elsworth Soho.  -Spoke with NCM Neoma Laming will set up [facility], PT, OT, RN, aide. -Patient and husband interested in pursuing Pulmonary Rehabilitation: Could address with Dr. Kara Mead PCCM -BIPAP home settings 16/8:  FiO2=40% -Patient has follow-up with NP Darlina Sicilian PCCM on 9/13 at 1000 -Patient has follow-up with Dr. Kara Mead PCCM on 10/3 at 1045 -[place]: (This note is used to comply with regulatory documentation for home oxygen)  Patient Saturations on Room Air at Rest = 80%  Patient Saturations on Room Air while Ambulating = Unable to assess  Patient Saturations on 3 Liters of oxygen while Ambulating = 88% NOTE ambulated <12 feet  Please briefly explain why patient needs home oxygen:Patients saturation dropped quickly to 80% while on room air.  We did not attempt ambulation while on room air due to risk to patient.  Ambulated patient on 3L patient unable to maintain 02 saturation while ambulating.   Chronic combined systolic and diastolic CHF -TEE 8/4 showed EF 40% + RV dysfxn as well as ruptured MV chord  - wgt 122.2kg at time of d/c 8/5 -Demadex 40 mg daily -Strict in and out since admission -4.4 L -Daily weight Filed Weights   01/04/16 0403 01/07/16 0341 01/08/16 0433  Weight: 119.6 kg (263 lb  10.7 oz) 119.1 kg (262 lb 8 oz) 118.8 kg (262 lb)  -Transfuse for hemoglobin<8 -8/4 Echocardiogram: See results below -Last seen by Dr. Glori Bickers 8/9. Schedule F/U appointment in 1-2 week with Dr Glori Bickers [facility]  HTN Well-controlled at this time  Parox A fib on anti-coag -Coumadin per pharmacy  - rate controlled Recent Labs Lab 01/05/16 0213 01/06/16 0516 01/07/16 0451 01/07/16 1853 01/08/16 0349  INR 2.78 1.88  1.55 1.47 1.51   AICD in place  MV regurgitation  Acute on Chronic renal Failure (Cr at time of discharge 8/5 was 2.85) Lab Results  Component Value Date   CREATININE 2.42 (H) 01/08/2016   CREATININE 2.75 (H) 01/07/2016   CREATININE 2.62 (H) 01/05/2016  -Creatinine is stable presently ,follow trend -   Chronic anemia( baseline hemoglobin ~10) -Likely related to renal failure  Recent Labs Lab 01/04/16 0534 01/05/16 0213 01/06/16 0516 01/07/16 0451 01/08/16 0349  HGB 8.6* 8.9* 9.3* 8.6* 9.0*    DM Type 2 controlled with, complications -8/2 Hemoglobin A1c= 6.6  Hypothyroidism -8/3 TSH = 4.2  -Increase Synthroid 50 g daily recheck TSH level and 6-8 weeks  Morbid obesity - Body mass index is 45.26 kg/m. The advisability of further weight loss has been discussed with the patient   Goals of care  -8/24 consulted palliative care: Patient with multiple hospital visits clear knees a greater level of care than can be obtained at home discuss short-term/long-term goals of care, assisted living vs palliative care vs hospice -8/27 interested in Pulmonary Rehabilitation   DVT prophylaxis: Coumadin per pharmacy Code Status: Full code Family Communication:  Disposition Plan:      Consultants:    Procedures/Significant Events:  8/4 TTE; -LVEF 40%. Global HK-RT VENTRICLE: Moderately HK -LEFT/ RIGHTATRIUM: Dilated -MITRAL VALVE:   Ruptured secondary on anterior leaflet of MV with mild prolapse. Mild MR. -TRICUSPID VALVE: Moderate regurgitation  Cultures   Antimicrobials:    Devices AICD  LINES / TUBES:      Continuous Infusions:    Objective: Vitals:   01/08/16 0433 01/08/16 0700 01/08/16 1100 01/08/16 1519  BP: (!) 114/92 112/80 (!) 131/57 135/61  Pulse: 70 65 70 70  Resp: 16 14 19  (!) 23  Temp: 98.4 F (36.9 C) 98.3 F (36.8 C) 98.1 F (36.7 C) 98.4 F (36.9 C)  TempSrc: Oral Oral Oral Oral  SpO2: 96%     Weight: 118.8 kg (262 lb)       Height:        Intake/Output Summary (Last 24 hours) at 01/08/16 1542 Last data filed at 01/08/16 0900  Gross per 24 hour  Intake              120 ml  Output              275 ml  Net             -155 ml   Filed Weights   01/04/16 0403 01/07/16 0341 01/08/16 0433  Weight: 119.6 kg (263 lb 10.7 oz) 119.1 kg (262 lb 8 oz) 118.8 kg (262 lb)    Examination:  General: A/O 4, positive chronic respiratory distress Eyes: negative scleral hemorrhage, negative anisocoria, negative icterus ENT: Negative Runny nose, negative gingival bleeding, Neck:  Negative scars, masses, torticollis, lymphadenopathy, JVD Lungs: Poor air movement all lung fields, difficult to assess because of patient's size, negative wheezes or crackles Cardiovascular: Irregular irregular rhythm and rate, without murmur gallop or rub normal S1 and S2 Abdomen:  MORBIDLY OBESE, negative abdominal pain, nondistended, positive soft, bowel sounds, no rebound, no ascites, no appreciable mass Extremities: No significant cyanosis, clubbing, or edema bilateral lower extremities Skin: Negative rashes, lesions, ulcers Psychiatric:  Very poor understanding of disease process  Central nervous system:  Cranial nerves II through XII intact, tongue/uvula midline, all extremities muscle strength 5/5, sensation intact throughout, negative dysarthria, negative expressive aphasia, negative receptive aphasia.  .     Data Reviewed: Care during the described time interval was provided by me .  I have reviewed this patient's available data, including medical history, events of note, physical examination, and all test results as part of my evaluation. I have personally reviewed and interpreted all radiology studies.  CBC:  Recent Labs Lab 01/04/16 0534 01/05/16 0213 01/06/16 0516 01/07/16 0451 01/08/16 0349  WBC 6.0 5.8 4.9 4.5 5.3  NEUTROABS  --   --  2.9 2.6 3.1  HGB 8.6* 8.9* 9.3* 8.6* 9.0*  HCT 29.6* 29.0* 31.8* 29.8* 31.0*  MCV  98.7 97.6 99.4 101.0* 101.0*  PLT 161 154 176 172 [id number]   Basic Metabolic Panel:  Recent Labs Lab 01/03/16 0324 01/04/16 0534 01/05/16 0213 01/05/16 2357 01/06/16 0516 01/07/16 1853 01/08/16 0349  NA 139 135 134* 136  --  137 139  K 4.3 4.6 4.6 4.6  --  4.7 4.3  CL 91* 89* 91* 90*  --  94* 95*  CO2 40* 39* 36* 38*  --  35* 37*  GLUCOSE 137* 121* 127* 102*  --  173* 111*  BUN 52* 60* 64* 65*  --  63* 62*  CREATININE 2.47* 2.58* 2.46* 2.62*  --  2.75* 2.42*  CALCIUM 9.0 8.7* 8.5* 8.8*  --  8.9 8.8*  MG 2.0  --   --   --  2.2 2.0 2.0  PHOS 4.1  --   --   --   --   --   --    GFR: Estimated Creatinine Clearance: 26.2 mL/min (by C-G formula based on SCr of 2.42 mg/dL). Liver Function Tests:  Recent Labs Lab 01/05/16 0213  AST 38  ALT 28  ALKPHOS 38  BILITOT 1.0  PROT 5.9*  ALBUMIN 3.0*   No results for input(s): LIPASE, AMYLASE in the last 168 hours. No results for input(s): AMMONIA in the last 168 hours. Coagulation Profile:  Recent Labs Lab 01/05/16 0213 01/06/16 0516 01/07/16 0451 01/07/16 1853 01/08/16 0349  INR 2.78 1.88 1.55 1.47 1.51   Cardiac Enzymes: No results for input(s): CKTOTAL, CKMB, CKMBINDEX, TROPONINI in the last 168 hours. BNP (last 3 results) No results for input(s): PROBNP in the last 8760 hours. HbA1C: No results for input(s): HGBA1C in the last 72 hours. CBG:  Recent Labs Lab 01/07/16 1720 01/07/16 2123 01/07/16 2211 01/08/16 0728 01/08/16 1128  GLUCAP 140* 174* 168* 98 141*   Lipid Profile: No results for input(s): CHOL, HDL, LDLCALC, TRIG, CHOLHDL, LDLDIRECT in the last 72 hours. Thyroid Function Tests: No results for input(s): TSH, T4TOTAL, FREET4, T3FREE, THYROIDAB in the last 72 hours. Anemia Panel: No results for input(s): VITAMINB12, FOLATE, FERRITIN, TIBC, IRON, RETICCTPCT in the last 72 hours. Urine analysis:    Component Value Date/Time   COLORURINE YELLOW 12/15/2015 0107   APPEARANCEUR CLEAR 12/15/2015 0107    LABSPEC 1.016 12/15/2015 0107   PHURINE 5.5 12/15/2015 0107   GLUCOSEU NEGATIVE 12/15/2015 0107   GLUCOSEU NEGATIVE 01/13/2009 1526   HGBUR NEGATIVE 12/15/2015 0107   HGBUR large 05/17/2008 1325   BILIRUBINUR NEGATIVE  12/15/2015 0107   KETONESUR NEGATIVE 12/15/2015 0107   PROTEINUR NEGATIVE 12/15/2015 0107   UROBILINOGEN 0.2 09/25/2014 2336   NITRITE NEGATIVE 12/15/2015 0107   LEUKOCYTESUR SMALL (A) 12/15/2015 0107   Sepsis Labs: @LABRCNTIP (procalcitonin:4,lacticidven:4)  ) Recent Results (from the past 240 hour(s))  MRSA PCR Screening     Status: None   Collection Time: 01/01/16  4:42 PM  Result Value Ref Range Status   MRSA by PCR NEGATIVE NEGATIVE Final    Comment:        The GeneXpert MRSA Assay (FDA approved for NASAL specimens only), is one component of a comprehensive MRSA colonization surveillance program. It is not intended to diagnose MRSA infection nor to guide or monitor treatment for MRSA infections.          Radiology Studies: No results found.      Scheduled Meds: . amiodarone  200 mg Oral Daily  . atorvastatin  20 mg Oral q1800  . carvedilol  50 mg Oral BID  . escitalopram  10 mg Oral Daily  . fenofibrate  54 mg Oral Daily  . gabapentin  300 mg Oral QHS  . insulin aspart  0-20 Units Subcutaneous TID WC  . insulin NPH Human  15 Units Subcutaneous QHS  . insulin NPH Human  20 Units Subcutaneous QAC breakfast  . levothyroxine  50 mcg Oral QAC breakfast  . LORazepam  1 mg Oral QHS  . rOPINIRole  0.25 mg Oral QHS  . sodium chloride flush  3 mL Intravenous Q12H  . torsemide  40 mg Oral Daily  . warfarin  2.5 mg Oral q1800  . Warfarin - Pharmacist Dosing Inpatient   Does not apply q1800   Continuous Infusions:    LOS: 7 days    Time spent: 40 minutes    Keriann Rankin, Geraldo Docker, MD [facility] Pager [phone]   If 7PM-7AM, please contact night-coverage [email or web address] Password [facility] 01/08/2016, 3:42 PM

## 2016-01-08 NOTE — Progress Notes (Signed)
ANTICOAGULATION CONSULT NOTE - Follow-Up Consult   Pharmacy Consult for Warfarin Indication: atrial fibrillation  Allergies  Allergen Reactions  . Sulfonamide Derivatives Other (See Comments)    Unknown; "childhood allergy/mother"    Patient Measurements: Height: 5\' 4"  (162.6 cm) Weight: 262 lb (118.8 kg) IBW/kg (Calculated) : 54.7  Vital Signs: Temp: 98.3 F (36.8 C) (08/27 0700) Temp Source: Oral (08/27 0700) BP: 112/80 (08/27 0700) Pulse Rate: 65 (08/27 0700)  Labs:  Recent Labs  01/05/16 2357  01/06/16 0516 01/07/16 0451 01/07/16 1853 01/08/16 0349  HGB  --   < > 9.3* 8.6*  --  9.0*  HCT  --   --  31.8* 29.8*  --  31.0*  PLT  --   --  176 172  --  172  LABPROT  --   < > 21.9* 18.8* 17.9* 18.4*  INR  --   < > 1.88 1.55 1.47 1.51  CREATININE 2.62*  --   --   --  2.75* 2.42*  < > = values in this interval not displayed.  Estimated Creatinine Clearance: 26.2 mL/min (by C-G formula based on SCr of 2.42 mg/dL).   Medical History: Past Medical History:  Diagnosis Date  . AICD (automatic cardioverter/defibrillator) present   . Anemia, unspecified 12/13/2013  . Back pain, chronic    "just when I walk; mass on 3rd and 4th vertebrae right lower back"  . Biventricular implantable cardiac defibrillator in situ 2007, 2012   a. 2007;  b. 2012 Gen change: SJM 3231-40 Uni BiV ICD, ser # [id number].  Marland Kitchen CAD (coronary artery disease)    a. 05/2001 CABG x4: LIMA->LAD, VG->D1, VG->D2, VG->RCA.  . Cardiomyopathy, ischemic    a. 2012 s/p SJM 3231-40 Uni BiV ICD, ser # [id number].  . Carotid stenosis    a. 10/19/2011 carotid duplex - Mild hard plaque bilaterally. Stable 40-59% bilateral ICA stenosis. Carotid US (04/2013):  Bilateral 40-59% ICA.  F/u 1 year  . Cerumen impaction 10/28/2015  . Chronic combined systolic and diastolic CHF (congestive heart failure) (HCC)    a. EF as low as 25% in 2006;  b. EF 60-65% in 12/2011;  c. 02/2013 Echo: EF 35-40, mod mid-dist antsept HK, Gr 2 DD, mild  LVH.  . CKD (chronic kidney disease), stage III   . 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 Dyslipidemia   . Gout ~ 08/2011  . Hypercalcemia 09/26/2013  . Hypertension   . Hypothyroidism   . Hypoxemia requiring supplemental oxygen   . LBBB (left bundle branch block)   . Morbid obesity with BMI of 45.0-49.9, adult (HCC)    Ht. 5'4". BMI 47.2  . Mural thrombus of left ventricle (HCC)    before 2003, while on Coumadin, No h/o CVA  . 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 Benay Spice, in remission  . On home oxygen therapy    "normally 2-3L; 24/7" (12/14/2015)  . OSA on CPAP   . PAF (paroxysmal atrial fibrillation) (HCC)    on Coumadin  . Presence of permanent cardiac pacemaker   . Retinopathy due to secondary diabetes ([place])    type II, uncontrolled  . Vitamin D deficiency 06/09/2012   historical    Medications:  PTA: Coumadin 2.5mg  daily except 5mg  on Sunday and Thursday.  Assessment: 75 yo F presented to ED with increased SOB and confusion x 3 days.  Pharmacy dosing PTA coumadin for  a-fib. INR 3.06 on admit, peak 3.79. INR was back in therapeutic range after several held/reduced doses and warfarin was ordered for 8/24 - however, dose was not given for unclear reasons. INR has increased slightly to 1.51 after 4 mg on 8/25 and 2.5 mg last night.   I will keep the patient on 2.5 mg daily due to her high INR on admit and her responsiveness to warfarin this admission. It is likely that the INR will rise again tomorrow reflective of the 4 mg dose given 8/25.   Hg low stable, plt wnl, No signs of bleeding noted.  Goal of Therapy:  INR 2-3 Monitor platelets by anticoagulation protocol: Yes   Plan:  Warfarin 2.5 mg x 1 tonight Spoke to Dr. Sherral Hammers and in anticipation of discharge would recommend warfarin 2.5 mg daily until patient can be followed up by an outpatient provider ( ~3 days  after discharge). Daily INR, monitor s/sx bleeding  Loetta Rough Pharmacy Resident Pager [phone] 01/08/2016 10:21 AM   I discussed / reviewed the pharmacy note by Dr. Nicole Kindred and I agree with the resident's findings and plans as documented.  [employer], Pharm.D., BCPS Clinical Pharmacist Pager [phone] 01/08/2016 12:20 PM

## 2016-01-09 LAB — GLUCOSE, CAPILLARY
GLUCOSE-CAPILLARY: 260 mg/dL — AB (ref 65–99)
Glucose-Capillary: 97 mg/dL (ref 65–99)
Glucose-Capillary: 141 mg/dL — ABNORMAL HIGH (ref 65–99)

## 2016-01-09 LAB — BASIC METABOLIC PANEL
Anion gap: 9 (ref 5–15)
BUN: 61 mg/dL — AB (ref 6–20)
CHLORIDE: 94 mmol/L — AB (ref 101–111)
CO2: 35 mmol/L — AB (ref 22–32)
CREATININE: 2.33 mg/dL — AB (ref 0.44–1.00)
Calcium: 8.9 mg/dL (ref 8.9–10.3)
GFR calc Af Amer: 23 mL/min — ABNORMAL LOW (ref >60)
GFR calc non Af Amer: 20 mL/min — ABNORMAL LOW (ref >60)
Glucose, Bld: 91 mg/dL (ref 65–99)
Potassium: 4.2 mmol/L (ref 3.5–5.1)
Sodium: 138 mmol/L (ref 135–145)

## 2016-01-09 LAB — MAGNESIUM: Magnesium: 2 mg/dL (ref 1.7–2.4)

## 2016-01-09 LAB — PROTIME-INR
INR: 1.54
Prothrombin Time: 18.6 seconds — ABNORMAL HIGH (ref 11.4–15.2)

## 2016-01-09 MED ORDER — WARFARIN SODIUM 4 MG PO TABS
4.0000 mg | ORAL_TABLET | Freq: Once | ORAL | Status: AC
  Administered 2016-01-09: 4 mg via ORAL
  Filled 2016-01-09: qty 1

## 2016-01-09 NOTE — Progress Notes (Signed)
ANTICOAGULATION CONSULT NOTE - Follow Up Consult  Pharmacy Consult for Coumadin Indication: atrial fibrillation  Allergies  Allergen Reactions  . Sulfonamide Derivatives Other (See Comments)    Unknown; "childhood allergy/mother"    Patient Measurements: Height: 5\' 4"  (162.6 cm) Weight: 261 lb 14.5 oz (118.8 kg) IBW/kg (Calculated) : 54.7  Vital Signs: Temp: 98 F (36.7 C) (08/28 0700) Temp Source: Oral (08/28 0700) BP: 149/67 (08/28 0416) Pulse Rate: 69 (08/28 0416)  Labs:  Recent Labs  01/07/16 0451 01/07/16 1853 01/08/16 0349 01/09/16 0240  HGB 8.6*  --  9.0*  --   HCT 29.8*  --  31.0*  --   PLT 172  --  172  --   LABPROT 18.8* 17.9* 18.4* 18.6*  INR 1.55 1.47 1.51 1.54  CREATININE  --  2.75* 2.42* 2.33*    Estimated Creatinine Clearance: 27.3 mL/min (by C-G formula based on SCr of 2.33 mg/dL).  Assessment:  Anticoagulation: Coumadin PTA for PAF, admit INR 3.06, peak 3.79. Restarted 8/25. INR now 1.54 - Noted amio restarted 8/22 (PTA) PTA Warfarin dose- 2.5 daily except 5 mg on Sun/Thurs   Goal of Therapy:  INR 2-3 Monitor platelets by anticoagulation protocol: Yes   Plan:  Warfarin 4mg  po x1 tonight to elevated INR Spoke to Dr. Sherral Hammers and recommended 2.5 daily until pt can be seen in clinic Daily INR, monitor s/sx bleeding   Carlyne Keehan S. Alford Highland, PharmD, BCPS Clinical Staff Pharmacist Pager [phone]  Eilene Ghazi Stillinger 01/09/2016,10:05 AM

## 2016-01-09 NOTE — Care Management Important Message (Signed)
Important Message  Patient Details  Name: Destiny [name] MRN: [id number] Date of Birth: 1941/01/31   Medicare Important Message Given:  Yes    Shigeko Manard Abena 01/09/2016, 12:12 PM

## 2016-01-09 NOTE — Care Management Note (Addendum)
Case Management Note  Patient Details  Name: Destiny [name] MRN: [id number] Date of Birth: [birth year]  Subjective/Objective:  NCM spoke with [name] at [place], she states they will need to set patient up with noninvasive ventilator, MD informed, she will fax orders over for MD.  NCM will notify [name] that patient is for dc noninvasive ventilator set up at patient's home.  Paper work signed, faxed to [employer],  Spouse is to meet [employer] rep at home to get niv set up.  Patient states her spouse will be transporting her home and he will bring her oxygen tank to transport her with.  Patient is also with [facility].                   Action/Plan:   Expected Discharge Date:                  Expected Discharge Plan:  [name]  In-House Referral:     Discharge planning Services  CM Consult  Post Acute Care Choice:  Durable Medical Equipment, Home Health Choice offered to:  Patient  DME Arranged:  [place], [name] DME Agency:  [name]  HH Arranged:  RN, PT, OT, Nurse's Aide [place] Agency:  [name])  Status of Service:  Completed, signed off  If discussed at [place] of Stay Meetings, dates discussed:    Additional Comments:  [name], RN 01/09/2016, 12:57 PM

## 2016-01-09 NOTE — Discharge Summary (Addendum)
DISCHARGE SUMMARY  Destiny [name]  MR#: [id number]  DOB:Jun 23, 1940  Date of Admission: 01/01/2016 Date of Discharge: 01/09/2016  Attending Physician:[name],Destiny T  Patient's BA:[id number], Destiny Levine, MD  Consults: PCCM  Disposition: D/C home at pt and husband request   Follow-up Appts: Follow-up Information    [name],KATHRYN, NP Follow up on 01/25/2016.   Specialty:  Pulmonary Disease Why:  10am  Contact information: [place] [address] [phone]        Destiny Noel., MD Follow up on 02/14/2016.   Specialty:  Pulmonary Disease Why:  at 1045 am  Contact information: [address] [address] [phone]        [name], Destiny Quince, MD. Schedule an appointment as soon as possible for a visit in 2 week(s).   Specialty:  Cardiology Why:  Last seen by Destiny. Glori [name] 8/9. Schedule F/U appointment in 1-2 week with Destiny [name] [facility] Contact information: [place] [address] [phone]        Destiny Homans, MD Follow up in 1 week(s).   Specialty:  Family Medicine Contact information: [address] [place] [address] [phone]        [place] Office. Call in 3 day(s).   Specialty:  Cardiology Why:  Arrange to have your INR checked in your usual coumadin clinic within 3 days. Contact information: [address] [place] [phone]         Home NIMV Rx: Patient requires noninvasive ventilator for chronic respiratory failure and COPD.  Without ventilator support at home, there is significant riks of CO2 retention and untimely re-admission to the hospital, or death.  A BIPAP will be ineffective in the home setting due to the lack of an auto rate.    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 She was D/C home on 8/5 on decreased demadex dose and experienced progressive worsening energy level over 3 days. She had been unable to wear her CPAP for 3 days due to associated desaturations.  She gained about 2 lbs.  8/20 her husband could barely wake her, and sought medical assistance. In the ED she was placed on NIPPV, ABG with hypercapnia and hypoxemia, pH 7.33. No reported infectious prodrome, WBC normal. After two hours she had changed little clinically.    Hospital Course:  Acute on chronic hypercapneic and hypoxemic resp failure due to uncontrolled OSA / OHS/COPD Change BIPAP to qhs mandatory and as needed during the day w/ auto rate as recommended by Pulmonary - f/u w Destiny [name] - NIMV arranged prior to discharge home as home BIPAP device offered did not allow for an auto rate   Chronic combined systolic and diastolic CHF  TEE 8/4 showed EF 40% + RV dysfxn as well as ruptured MV chord - wgt 122.2kg at time of d/c 8/5 - actually appears relatively well compensated at present with weight below her recent discharge weight - to f/u in CHF Clinic w/ Destiny. Carmelia [name] Weights   01/07/16 0341 01/08/16 0433 01/09/16 0500  Weight: 119.1 kg (262 lb 8 oz) 118.8 kg (262 lb) 118.8 kg (261 lb 14.5 oz)    HTN Well-controlled at time of  d/c   Parox A fib on anti-coag Coumadin per pharmacy - rate controlled  AICD in place  MV regurgitation  Chronic renal insufficiency Creatinine is stable presently - creatinine at time of discharge 8/5 was 2.85  Recent Labs Lab 01/05/16 0213 01/05/16 2357 01/07/16 1853 01/08/16 0349 01/09/16 0240  CREATININE 2.46* 2.62* 2.75* 2.42* 2.33*     Chronic anemia  Likely related to renal failure - baseline hemoglobin appears to be approximately 10 - stable at time of d/c   Recent Labs Lab 01/04/16 0534 01/05/16 0213 01/06/16 0516 01/07/16 0451 01/08/16 0349  HGB 8.6* 8.9* 9.3* 8.6* 9.0*    DM CBG controlled at time of d/c   Hypothyroidism Continue home Synthroid  Morbid obesity - Body mass index is 45.26 kg/m. The advisability of further weight loss has been discussed with the patient     Medication List    STOP taking these medications   chlorpheniramine 4 MG tablet Commonly known as:  CHLOR-TRIMETON     TAKE these medications   acetaminophen 500 MG tablet Commonly known as:  TYLENOL Take 1,000 mg by mouth 2 (two) times daily as needed for mild pain.   albuterol 108 (90 Base) MCG/ACT inhaler Commonly known as:  PROVENTIL HFA;VENTOLIN HFA Inhale 2 puffs into the lungs every 6 (six) hours as needed for wheezing or shortness of breath.   amiodarone 200 MG tablet Commonly known as:  PACERONE Take 200 mg by mouth daily.   atorvastatin 20 MG tablet Commonly known as:  LIPITOR TAKE 1 TABLET EVERY DAY What changed:  See the new instructions.   carvedilol 25 MG tablet Commonly known as:  COREG TAKE 2 TABLETS TWICE DAILY What changed:  See the new instructions.   escitalopram 10 MG tablet Commonly known as:  LEXAPRO TAKE 1 TABLET EVERY DAY What changed:  See the new instructions.   fenofibrate 48 MG tablet Commonly known as:  TRICOR TAKE 1 TABLET EVERY DAY What changed:  See the new instructions.   fluticasone 50 MCG/ACT nasal  spray Commonly known as:  FLONASE Place 2 sprays into both nostrils daily as needed for allergies or rhinitis. As needed for nasal stuffiness.   gabapentin 300 MG capsule Commonly known as:  NEURONTIN Take 300 mg by mouth at bedtime.   glucose blood test strip Commonly known as:  FREESTYLE LITE DX: 250.60  Check sugars bid and as needed What changed:  how much to take  how to take this  when to take this  additional instructions   insulin NPH Human 100 UNIT/ML injection Commonly known as:  HUMULIN N,NOVOLIN N Inject 15-30 Units into the skin 2 (two) times daily before a meal. Take 20-30 units in the morning and 10-15 units in the evening   insulin regular 100 units/mL injection Commonly known as:  NOVOLIN R,HUMULIN R Change to 15 units three times a with meals (to cover all meals--this is the same total amount but spread out) What changed:  how much to take  how to take this  when to take this  additional instructions   levothyroxine 25 MCG tablet Commonly known as:  SYNTHROID, LEVOTHROID TAKE 1 TABLET EVERY DAY BEFORE BREAKFAST What changed:  See the new instructions.   LORazepam 0.5 MG tablet Commonly known as:  ATIVAN Take 1 tablet (0.5 mg total) by mouth at bedtime.   OXYGEN Inhale 3 L into the lungs continuous.   rOPINIRole 0.25 MG tablet Commonly known as:  REQUIP Take 1 tablet (0.25 mg total) by mouth at bedtime.   torsemide 20 MG tablet Commonly known as:  DEMADEX Take 2 tablets (40 mg total) by mouth daily.   Vitamin D (Ergocalciferol) 50000 units Caps capsule Commonly known as:  DRISDOL Take 50,000 Units by mouth every Tuesday.   warfarin 5 MG tablet Commonly known as:  COUMADIN TAKE AS DIRECTED BY ANTICOAGULATION CLINIC. What changed:  See the new instructions.       Day of Discharge BP (!) 149/67 (BP Location: Left Wrist)   Pulse 70   Temp 98 F (36.7 C) (Oral)   Resp 18   Ht 5\' 4"  (1.626 m)   Wt 118.8 kg (261 lb 14.5 oz)    SpO2 97%   BMI 44.96 kg/m   Physical Exam: General: No acute respiratory distress Lungs: Clear to auscultation bilaterally without wheezes or crackles Cardiovascular: Regular rate and rhythm without murmur gallop or rub normal S1 and S2 Abdomen: Nontender, nondistended, soft, bowel sounds positive, no rebound, no ascites, no appreciable mass Extremities: No significant cyanosis, clubbing, or signif edema bilateral lower extremities  Basic Metabolic Panel:  Recent Labs Lab 01/03/16 0324  01/05/16 0213 01/05/16 2357 01/06/16 0516 01/07/16 1853 01/08/16 0349 01/09/16 0240  NA 139  < > 134* 136  --  137 139 138  K 4.3  < > 4.6 4.6  --  4.7 4.3 4.2  CL 91*  < > 91* 90*  --  94* 95* 94*  CO2 40*  < > 36* 38*  --  35* 37* 35*  GLUCOSE 137*  < > 127* 102*  --  173* 111* 91  BUN 52*  < > 64* 65*  --  63* 62* 61*  CREATININE 2.47*  < > 2.46* 2.62*  --  2.75* 2.42* 2.33*  CALCIUM 9.0  < > 8.5* 8.8*  --  8.9 8.8* 8.9  MG 2.0  --   --   --  2.2 2.0 2.0 2.0  PHOS 4.1  --   --   --   --   --   --   --   < > = values in this interval not displayed.  Liver Function Tests:  Recent Labs Lab 01/05/16 0213  AST 38  ALT 28  ALKPHOS 38  BILITOT 1.0  PROT 5.9*  ALBUMIN 3.0*    Coags:  Recent Labs Lab 01/06/16 0516 01/07/16 0451 01/07/16 1853 01/08/16 0349 01/09/16 0240  INR 1.88 1.55 1.47 1.51 1.54   CBC:  Recent Labs Lab 01/04/16 0534 01/05/16 0213 01/06/16 0516 01/07/16 0451 01/08/16 0349  WBC 6.0 5.8 4.9 4.5 5.3  NEUTROABS  --   --  2.9 2.6 3.1  HGB 8.6* 8.9* 9.3* 8.6* 9.0*  HCT 29.6* 29.0* 31.8* 29.8* 31.0*  MCV 98.7 97.6 99.4 101.0* 101.0*  PLT 161 154 176 172 172    BNP (last 3 results)  Recent Labs  12/05/15 1144 12/15/15 0423 01/01/16 1110  BNP 655.1* 758.8* 829.2*     CBG:  Recent  Labs Lab 01/08/16 1128 01/08/16 1654 01/08/16 2123 01/09/16 0720 01/09/16 1144  GLUCAP 141* 146* 138* 97 141*    Recent Results (from the past 240 hour(s))   MRSA PCR Screening     Status: None   Collection Time: 01/01/16  4:42 PM  Result Value Ref Range Status   MRSA by PCR NEGATIVE NEGATIVE Final    Comment:        The GeneXpert MRSA Assay (FDA approved for NASAL specimens only), is one component of a comprehensive MRSA colonization surveillance program. It is not intended to diagnose MRSA infection nor to guide or monitor treatment for MRSA infections.      Time spent in discharge (includes decision making & examination of pt): >30 minutes  01/09/2016, 2:00 PM   Cherene Altes, MD [facility] Office  [phone] Pager [phone]  On-Call/Text Page:      [email or web address]      password [facility]

## 2016-01-09 NOTE — Progress Notes (Signed)
Physical Therapy Treatment Patient Details Name: Destiny [name] MRN: [id number] DOB: 08-03-1940 Today's Date: 01/09/2016    History of Present Illness Patient is a 75 yo female admitted 01/01/16 with acute hypoxic resp failure and hypercapnia, and AMS.    PMH:  CHF, morbid obesity, COPD on home O2, chronic hypoxemia/resp failure, OSA on CPAP, ICM, AICD, PAF, HTN, DM    PT Comments    Pt able to increase ambulation distance today before sitting break. O2 to 85% on 3 L/min.  Con't to recommend HHPT.  Follow Up Recommendations  Home health PT;Supervision for mobility/OOB     Equipment Recommendations  3in1 (PT) (bariatric)    Recommendations for Other Services       Precautions / Restrictions Precautions Precautions: Fall Restrictions Weight Bearing Restrictions: No    Mobility  Bed Mobility               General bed mobility comments: Patient in chair  Transfers Overall transfer level: Needs assistance Equipment used: Rolling walker (2 wheeled) Transfers: Sit to/from Stand Sit to Stand: Supervision         General transfer comment: cues for hand placement  Ambulation/Gait Ambulation/Gait assistance: Min guard;+2 safety/equipment Ambulation Distance (Feet): 40 Feet (x 2) Assistive device: Rolling walker (2 wheeled) Gait Pattern/deviations: Step-through pattern;Decreased step length - right;Decreased step length - left;Wide base of support Gait velocity: decreased   General Gait Details: no LOB. o2 dropped to 85% on 3 L/min after first 40' of gait.  Sitting break then ambulated back on 4 L/min at 90%.  In room returned to 3 L/min and o2 in 90s at time of exit.   Stairs            Wheelchair Mobility    Modified Rankin (Stroke Patients Only)       Balance           Standing balance support: Bilateral upper extremity supported Standing balance-Leahy Scale: Poor Standing balance comment: relies on UE support                     Cognition Arousal/Alertness: Awake/alert Behavior During Therapy: WFL for tasks assessed/performed Overall Cognitive Status: Within Functional Limits for tasks assessed                      Exercises      General Comments        Pertinent Vitals/Pain Pain Assessment: No/denies pain    Home Living                      Prior Function            PT Goals (current goals can now be found in the care plan section) Acute Rehab PT Goals Patient Stated Goal: to go home soon PT Goal Formulation: With patient Time For Goal Achievement: 01/15/16 Potential to Achieve Goals: Good Progress towards PT goals: Progressing toward goals    Frequency  Min 3X/week    PT Plan Current plan remains appropriate    Co-evaluation             End of Session Equipment Utilized During Treatment: Gait belt;Oxygen Activity Tolerance: Patient tolerated treatment well Patient left: in chair;with call bell/phone within reach;Other (comment) (with lunch)     Time: [id number] PT Time Calculation (min) (ACUTE ONLY): 16 min  Charges:  $Gait Training: 8-22 mins  G Codes:      Destiny [name] 01/09/2016, 2:08 PM

## 2016-01-10 ENCOUNTER — Telehealth: Payer: Self-pay | Admitting: Family Medicine

## 2016-01-10 ENCOUNTER — Other Ambulatory Visit: Payer: Self-pay | Admitting: *Deleted

## 2016-01-10 ENCOUNTER — Ambulatory Visit (INDEPENDENT_AMBULATORY_CARE_PROVIDER_SITE_OTHER): Payer: Medicare Other | Admitting: Interventional Cardiology

## 2016-01-10 ENCOUNTER — Telehealth: Payer: Self-pay | Admitting: Behavioral Health

## 2016-01-10 ENCOUNTER — Encounter: Payer: Self-pay | Admitting: *Deleted

## 2016-01-10 ENCOUNTER — Other Ambulatory Visit: Payer: Self-pay

## 2016-01-10 DIAGNOSIS — J449 Chronic obstructive pulmonary disease, unspecified: Secondary | ICD-10-CM | POA: Diagnosis not present

## 2016-01-10 DIAGNOSIS — Z5181 Encounter for therapeutic drug level monitoring: Secondary | ICD-10-CM

## 2016-01-10 DIAGNOSIS — G4733 Obstructive sleep apnea (adult) (pediatric): Secondary | ICD-10-CM | POA: Diagnosis not present

## 2016-01-10 DIAGNOSIS — Z8572 Personal history of non-Hodgkin lymphomas: Secondary | ICD-10-CM | POA: Diagnosis not present

## 2016-01-10 DIAGNOSIS — I130 Hypertensive heart and chronic kidney disease with heart failure and stage 1 through stage 4 chronic kidney disease, or unspecified chronic kidney disease: Secondary | ICD-10-CM | POA: Diagnosis not present

## 2016-01-10 DIAGNOSIS — Z9181 History of falling: Secondary | ICD-10-CM | POA: Diagnosis not present

## 2016-01-10 DIAGNOSIS — E1140 Type 2 diabetes mellitus with diabetic neuropathy, unspecified: Secondary | ICD-10-CM | POA: Diagnosis not present

## 2016-01-10 DIAGNOSIS — Z9981 Dependence on supplemental oxygen: Secondary | ICD-10-CM | POA: Diagnosis not present

## 2016-01-10 DIAGNOSIS — Z6841 Body Mass Index (BMI) 40.0 and over, adult: Secondary | ICD-10-CM | POA: Diagnosis not present

## 2016-01-10 DIAGNOSIS — I480 Paroxysmal atrial fibrillation: Secondary | ICD-10-CM | POA: Diagnosis not present

## 2016-01-10 DIAGNOSIS — E11319 Type 2 diabetes mellitus with unspecified diabetic retinopathy without macular edema: Secondary | ICD-10-CM | POA: Diagnosis not present

## 2016-01-10 DIAGNOSIS — Z794 Long term (current) use of insulin: Secondary | ICD-10-CM | POA: Diagnosis not present

## 2016-01-10 DIAGNOSIS — J9612 Chronic respiratory failure with hypercapnia: Secondary | ICD-10-CM | POA: Diagnosis not present

## 2016-01-10 DIAGNOSIS — I5042 Chronic combined systolic (congestive) and diastolic (congestive) heart failure: Secondary | ICD-10-CM | POA: Diagnosis not present

## 2016-01-10 DIAGNOSIS — Z7901 Long term (current) use of anticoagulants: Secondary | ICD-10-CM | POA: Diagnosis not present

## 2016-01-10 DIAGNOSIS — Z951 Presence of aortocoronary bypass graft: Secondary | ICD-10-CM | POA: Diagnosis not present

## 2016-01-10 DIAGNOSIS — Z95810 Presence of automatic (implantable) cardiac defibrillator: Secondary | ICD-10-CM | POA: Diagnosis not present

## 2016-01-10 DIAGNOSIS — J9611 Chronic respiratory failure with hypoxia: Secondary | ICD-10-CM | POA: Diagnosis not present

## 2016-01-10 DIAGNOSIS — N183 Chronic kidney disease, stage 3 (moderate): Secondary | ICD-10-CM | POA: Diagnosis not present

## 2016-01-10 DIAGNOSIS — E1122 Type 2 diabetes mellitus with diabetic chronic kidney disease: Secondary | ICD-10-CM | POA: Diagnosis not present

## 2016-01-10 DIAGNOSIS — E039 Hypothyroidism, unspecified: Secondary | ICD-10-CM | POA: Diagnosis not present

## 2016-01-10 DIAGNOSIS — I2510 Atherosclerotic heart disease of native coronary artery without angina pectoris: Secondary | ICD-10-CM | POA: Diagnosis not present

## 2016-01-10 DIAGNOSIS — E662 Morbid (severe) obesity with alveolar hypoventilation: Secondary | ICD-10-CM | POA: Diagnosis not present

## 2016-01-10 DIAGNOSIS — R32 Unspecified urinary incontinence: Secondary | ICD-10-CM | POA: Diagnosis not present

## 2016-01-10 LAB — POCT INR: INR: 1.7

## 2016-01-10 NOTE — Patient Outreach (Signed)
[facility]) Care Management [facility] CM Telephone Outreach, Transition of Care Attempt #1 01/10/2016  Destiny [name] [birth year] [id number]  Successful telephone outreach to [name], husband of Destiny [name], 75 y/o female referred to [facility] for transition of care after two recent IP hospital visits August 2-5, 2017 for acute encephalopathy related to diuresis, polypharmacy, and hypercarbia; and August 20-28, 2017 for acute respiratory failure due to uncontrolled OSA/ OHS.  [facility] CM written consent was obtained by [facility] liaison during most recent hospitalization; HIPAA verified through patient's husband.  Today, Mr. Luff adamantly declined participation with [facility] CM services, stating that the patient "already has" everything they need and that [facility] was already involved and has visited patient today.  Mr. Bley stated that he nor his wife remembered "signing anything," and added, "if we did, it was just because we thought you were home care, too many people come in the hospital and it is just very confusing."    I attempted to explain [facility] CM services to Mr. [place], but he did not wish to speak on the phone today and asked that patient be taken off [facility] list, stating, "it is all just a waste of time."    Plan:  Will close this patient's case as requested and make patient's PCP aware of same.  Oneta Rack, RN, BSN, [facility] Care Management  [phone] '

## 2016-01-10 NOTE — Telephone Encounter (Signed)
Per the patient's spouse, "she's doing good right now and currently using a bi-pap machine". Declined TCM/Hospital Follow-up at this time. Advised patient's spouse that if they notice any alarming symptoms or worsening condition to call the office or seek the nearest emergency department. He verbalized understanding and did not have any questions or concerns prior to call ending.  Patient has two upcoming appointments on 01/23/16 with Dr. Charlett Blake for an annual physical & Medicare Wellness visit with RN.

## 2016-01-10 NOTE — Telephone Encounter (Signed)
I would say yes to AWV with RN same day as hospital follow up but say I do not want to commit to CPE as well. She has just been very sick so I will do everything she needs but to do all 3 visits would be very taxing.

## 2016-01-10 NOTE — Telephone Encounter (Signed)
Pt called in because appt (CPE) had to be rescheduled due to PCP out of office (01/23/16). Scheduled pt hospital fu for 01/12/16. Pt would like to know if she should go ahead and have wellness visit with nurse on that day before appt? She says that she also need her cpe, she would like to know if provider could complete CPE as well as hosp. Fu in same appt?

## 2016-01-11 ENCOUNTER — Telehealth: Payer: Self-pay | Admitting: Cardiology

## 2016-01-11 ENCOUNTER — Ambulatory Visit (INDEPENDENT_AMBULATORY_CARE_PROVIDER_SITE_OTHER): Payer: Medicare Other

## 2016-01-11 DIAGNOSIS — Z95810 Presence of automatic (implantable) cardiac defibrillator: Secondary | ICD-10-CM | POA: Diagnosis not present

## 2016-01-11 DIAGNOSIS — I5022 Chronic systolic (congestive) heart failure: Secondary | ICD-10-CM | POA: Diagnosis not present

## 2016-01-11 DIAGNOSIS — I5042 Chronic combined systolic (congestive) and diastolic (congestive) heart failure: Secondary | ICD-10-CM | POA: Diagnosis not present

## 2016-01-11 DIAGNOSIS — E662 Morbid (severe) obesity with alveolar hypoventilation: Secondary | ICD-10-CM | POA: Diagnosis not present

## 2016-01-11 DIAGNOSIS — I130 Hypertensive heart and chronic kidney disease with heart failure and stage 1 through stage 4 chronic kidney disease, or unspecified chronic kidney disease: Secondary | ICD-10-CM | POA: Diagnosis not present

## 2016-01-11 DIAGNOSIS — E1122 Type 2 diabetes mellitus with diabetic chronic kidney disease: Secondary | ICD-10-CM | POA: Diagnosis not present

## 2016-01-11 DIAGNOSIS — N183 Chronic kidney disease, stage 3 (moderate): Secondary | ICD-10-CM | POA: Diagnosis not present

## 2016-01-11 DIAGNOSIS — G4733 Obstructive sleep apnea (adult) (pediatric): Secondary | ICD-10-CM | POA: Diagnosis not present

## 2016-01-11 NOTE — Progress Notes (Signed)
EPIC Encounter for ICM Monitoring  Patient Name: Destiny [name] is a 75 y.o. female Date: 01/11/2016 Primary Care Physican: Penni Homans, MD Primary Cardiologist: [place] Electrophysiologist: Druscilla Brownie Weight: 254 lb  Bi-V Pacing:  >99%       Heart Failure questions reviewed, pt asymptomatic.  Patient reported hospitalization for COPD exacerbation and discharged 01/09/2016.   She reported she has a lot of follow up appointments.  Thoracic impedance above baseline due to patient was diuresed during hospitalization.    Recommendations:  No changes.  Advised to follow low sodium.  Repeat transmission in 2 weeks, 01/25/2016.    Follow-up plan: ICM clinic phone appointment on 01/25/2016.  Copy of ICM check sent to primary cardiologist and device physician.   ICM trend: 01/11/2016       [name], RN 01/11/2016 3:22 PM

## 2016-01-11 NOTE — Telephone Encounter (Signed)
Spoke with pt and reminded pt of remote transmission that is due today. Pt verbalized understanding.   

## 2016-01-12 ENCOUNTER — Ambulatory Visit (INDEPENDENT_AMBULATORY_CARE_PROVIDER_SITE_OTHER): Payer: Medicare Other | Admitting: Family Medicine

## 2016-01-12 ENCOUNTER — Encounter: Payer: Self-pay | Admitting: Family Medicine

## 2016-01-12 VITALS — BP 136/74 | HR 70 | Temp 98.7°F | Resp 18 | Wt 258.6 lb

## 2016-01-12 DIAGNOSIS — N289 Disorder of kidney and ureter, unspecified: Secondary | ICD-10-CM

## 2016-01-12 DIAGNOSIS — D649 Anemia, unspecified: Secondary | ICD-10-CM | POA: Diagnosis not present

## 2016-01-12 DIAGNOSIS — Z0000 Encounter for general adult medical examination without abnormal findings: Secondary | ICD-10-CM

## 2016-01-12 DIAGNOSIS — J9611 Chronic respiratory failure with hypoxia: Secondary | ICD-10-CM | POA: Diagnosis not present

## 2016-01-12 DIAGNOSIS — I10 Essential (primary) hypertension: Secondary | ICD-10-CM

## 2016-01-12 DIAGNOSIS — E1122 Type 2 diabetes mellitus with diabetic chronic kidney disease: Secondary | ICD-10-CM | POA: Diagnosis not present

## 2016-01-12 DIAGNOSIS — I6350 Cerebral infarction due to unspecified occlusion or stenosis of unspecified cerebral artery: Secondary | ICD-10-CM

## 2016-01-12 DIAGNOSIS — J449 Chronic obstructive pulmonary disease, unspecified: Secondary | ICD-10-CM

## 2016-01-12 NOTE — Progress Notes (Signed)
Pre visit review using our clinic review tool, if applicable. No additional management support is needed unless otherwise documented below in the visit note. 

## 2016-01-12 NOTE — Patient Instructions (Addendum)
Continue to eat heart healthy diet (full of fruits, vegetables, whole grains, lean protein, water--limit salt, fat, and sugar intake) and increase physical activity as tolerated.  Continue doing brain stimulating activities (puzzles, reading, adult coloring books, staying active) to keep memory sharp.   Chair exercises as tolerated.   Cerebral Hypoxia Cerebral hypoxia occurs when your brain does not get enough oxygen because of low blood flow. Cerebral hypoxia can affect parts of your brain or all of your brain. Without enough oxygen, brain cells can start to die within five minutes. Cerebral hypoxia can be mild or severe. You may have cerebral hypoxia in which your brain receives less oxygen but you continue to have blood flow. You also may have cerebral hypoxia that causes reduced oxygen along with interrupted blood flow. Not getting enough blood and oxygen can cause a stroke. Mild cerebral hypoxia may clear up completely. Severe cerebral hypoxia can cause permanent brain damage. CAUSES Mild cerebral hypoxia may be caused by:  Choking.  Breathing in smoke.  Being at extremely high altitudes for long periods of time or when your body is not used to being at moderately high altitudes.  Carbon monoxide poisoning.  Any disease or condition that prevents movement of the muscles that you need in order to breathe. Severe cerebral hypoxia may be caused by:  Head trauma.  Complications from general anesthetic.  Drug overdose.  Certain heart problems.  Drowning.  Suffocation.  Blood clot in the brain (stroke). RISK FACTORS Cerebral hypoxia is often unexpected and accidental. However, the following factors may increase your risk for cerebral hypoxia:  Being at higher risk for cardiovascular disease due to:  High blood pressure (hypertension).  High cholesterol.  Smoking.  Being female.  Being overweight or obese.  Being older in age.  A family history of heart  disease.  Lack of exercise.  Diabetes.  Stress.  Drinking too much alcohol.  Using illegal street drugs, such as cocaine and methamphetamines.  Participating in high-risk activities that can result in:  Head injury or trauma.  Drowning.  Suffocation.  Hiking at high altitudes.  Exposure to carbon monoxide.  Having surgery that requires anesthetic. SIGNS AND SYMPTOMS Signs and symptoms of cerebral hypoxia depend on the severity and the length of time that the brain has been without oxygen. Signs and symptoms of mild cerebral hypoxia may include:  Memory loss.  Not being able to pay attention.  Loss of coordination.  Poor judgment.  Headache. Signs and symptoms of severe cerebral hypoxia may include:  Lack of breathing.  A state of deep unconsciousness (coma).  Eyes that do not respond to light.  Seizures.  Numbness or weakness on one side of your body.  Slurred speech.  Facial drooping. DIAGNOSIS Your health care provider may diagnose cerebral hypoxia based on your symptoms and your medical history. Your health care provider will also do a physical exam. You may have tests to determine the cause of hypoxia and to find out how severe it is. These may include:  Blood tests to check the oxygen level in your blood.  Electrocardiogram (ECG) to check how well your heart is functioning.  Electroencephalogram (EEG) to test for abnormal brain activity or seizures.  Imaging studies to check your heart, lungs, and brain. These may include:  Echocardiogram to get an image of your heart.  MRI and CT scans to check for brain damage. TREATMENT Emergency treatment of cerebral hypoxia starts with taking measures to restore oxygen to your brain and  keep your body functioning (life support). You may:  Have a breathing tube put in to keep your airway open (intubation).  Get oxygen.  Have a machine to help you breathe (mechanical ventilation).  Get fluids or  blood through an IV line.  Take medicines to control blood pressure, heart rate, and seizures. Long-term treatment of cerebral hypoxia depends on the amount of brain damage. Treatment may include:  Physical therapy.  Mental health therapy.  Nutritional therapy. HOME CARE INSTRUCTIONS What you need to do at home will depend on the severity of the cerebral hypoxia. Follow the instructions that your health care provider gives to you. In general:  Keep all follow-up visits as directed by your health care provider. This is important. This includes any rehabilitation that your health care provider may suggest, such as:  Physical therapy.  Mental health therapy.  Nutritional therapy.  Take medicines only as directed by your health care provider.  Follow any restrictions that are given to you by your health care provider. SEEK IMMEDIATE MEDICAL CARE IF:  You have symptoms of cerebral hypoxia that change or get worse during recovery.  You have symptoms of cerebral hypoxia that return during or after recovery.  You have a seizure.  You have chest pain or have difficulty breathing.   This information is not intended to replace advice given to you by your health care provider. Make sure you discuss any questions you have with your health care provider.   Document Released: 04/20/2002 Document Revised: 05/21/2014 Document Reviewed: 12/09/2013 Elsevier Interactive Patient Education [employer].

## 2016-01-12 NOTE — Progress Notes (Signed)
Subjective:   [name] is a 75 y.o. female who presents for Medicare Annual (Subsequent) preventive examination.  Review of Systems:  No ROS.  Medicare Wellness Visit.  Cardiac Risk Factors include: advanced age (>88men, >54 women);diabetes mellitus;dyslipidemia;hypertension;obesity (BMI >30kg/m2);sedentary lifestyle  Sleep patterns: No sleep issues.    Home Safety/Smoke Alarms: Lives at home w/ husband who is primary caretaker. Feels safe in home. Smoke alarms in home. Living environment; residence and Firearm Safety: Stored in a safe place. Seat Belt Safety/Bike Helmet: Wears seat belt.   Counseling:   Eye Exam- Dr. Prudencio Burly yearly or as needed. Dental- No regular dentist. Sees only if issues. No dental/gum pain or sensitivity per pt.    Female:   Pap- Hysterectomy       Mammo- last 06/25/08      Dexa scan- Not on file. CCS- Never  Pt declines MMG, DEXA, and colonoscopy. Follow-up with PCP.     Objective:     Vitals: BP 136/74 (BP Location: Right Arm, Patient Position: Sitting, Cuff Size: Large)   Pulse 70   Temp 98.7 F (37.1 C) (Oral)   Resp 18   Wt 258 lb 9.6 oz (117.3 kg)   SpO2 91%   BMI 44.39 kg/m   Body mass index is 44.39 kg/m.   Tobacco History  Smoking Status  . Never Smoker  Smokeless Tobacco  . Never Used     Counseling given: Not Answered   Past Medical History:  Diagnosis Date  . AICD (automatic cardioverter/defibrillator) present   . Anemia, unspecified 12/13/2013  . Back pain, chronic    "just when I walk; mass on 3rd and 4th vertebrae right lower back"  . Biventricular implantable cardiac defibrillator in situ 2007, 2012   a. 2007;  b. 2012 Gen change: SJM 3231-40 Uni BiV ICD, ser # [id number].  Marland Kitchen CAD (coronary artery disease)    a. 05/2001 CABG x4: LIMA->LAD, VG->D1, VG->D2, VG->RCA.  . Cardiomyopathy, ischemic    a. 2012 s/p SJM 3231-40 Uni BiV ICD, ser # [id number].  . Carotid stenosis    a. 10/19/2011 carotid duplex - Mild hard plaque  bilaterally. Stable 40-59% bilateral ICA stenosis. Carotid US (04/2013):  Bilateral 40-59% ICA.  F/u 1 year  . Cerumen impaction 10/28/2015  . Chronic combined systolic and diastolic CHF (congestive heart failure) (HCC)    a. EF as low as 25% in 2006;  b. EF 60-65% in 12/2011;  c. 02/2013 Echo: EF 35-40, mod mid-dist antsept HK, Gr 2 DD, mild LVH.  . CKD (chronic kidney disease), stage III   . COPD (chronic obstructive pulmonary disease) ([place])   . 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 Dyslipidemia   . Gout ~ 08/2011  . Hypercalcemia 09/26/2013  . Hypertension   . Hypothyroidism   . Hypoxemia requiring supplemental oxygen   . LBBB (left bundle branch block)   . Morbid obesity with BMI of 45.0-49.9, adult (HCC)    Ht. 5'4". BMI 47.2  . Mural thrombus of left ventricle (HCC)    before 2003, while on Coumadin, No h/o CVA  . 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 Benay Spice, in remission  . On home oxygen therapy    "normally 2-3L; 24/7" (12/14/2015)  . OSA on CPAP   . PAF (paroxysmal atrial fibrillation) (HCC)    on Coumadin  . Presence of permanent cardiac pacemaker   .  Retinopathy due to secondary diabetes ([place])    type II, uncontrolled  . Vitamin D deficiency 06/09/2012   historical   Past Surgical History:  Procedure Laterality Date  . ABDOMINAL HYSTERECTOMY  1982  . CARDIAC CATHETERIZATION  06/03/01  . CARDIOVERSION N/A 07/30/2014   Procedure: CARDIOVERSION;  Surgeon: Jolaine Artist, MD;  Location: [facility] ENDOSCOPY;  Service: Cardiovascular;  Laterality: N/A;  . CARDIOVERSION N/A 11/25/2014   Procedure: CARDIOVERSION;  Surgeon: Evans Lance, MD;  Location: [facility];  Service: Cardiovascular;  Laterality: N/A;  . CATARACT EXTRACTION     left eye  . CHOLECYSTECTOMY  1970  . CORONARY ARTERY BYPASS GRAFT  2003   CABG X4  . INSERT / REPLACE / REMOVE PACEMAKER  2007; 2012    w/AICD  . REFRACTIVE SURGERY     right eye  . TEE WITHOUT CARDIOVERSION N/A 12/16/2015   Procedure: TRANSESOPHAGEAL ECHOCARDIOGRAM (TEE);  Surgeon: Jolaine Artist, MD;  [facility]COPY;  Service: Cardiovascular;  Laterality: N/A;  . TUBAL LIGATION  1972   Family History  Problem Relation Age of Onset  . Kidney cancer Mother     kidney and female repo - died @ 80  . Asthma Mother   . Cancer Mother     gyn and renal  . Heart disease Mother     CAD  . Heart disease Father     died @ 24  . Stroke Father   . Diabetes Father   . Hypertension Father   . Hyperlipidemia Father   . Heart attack Father   . Hypertension Son   . Kidney cancer Maternal Uncle   . Cancer Maternal Uncle   . Cirrhosis Maternal Grandmother     non alcohol  . Cancer Maternal Grandfather   . Kidney cancer Maternal Grandfather   . Hypertension Maternal Aunt    History  Sexual Activity  . Sexual activity: Not Currently    Comment: lives with husband, no major dietary restrictions    Outpatient Encounter Prescriptions as of 01/12/2016  Medication Sig  . acetaminophen (TYLENOL) 500 MG tablet Take 1,000 mg by mouth 2 (two) times daily as needed for mild pain.   Marland Kitchen albuterol (PROVENTIL HFA;VENTOLIN HFA) 108 (90 BASE) MCG/ACT inhaler Inhale 2 puffs into the lungs every 6 (six) hours as needed for wheezing or shortness of breath.  Marland Kitchen amiodarone (PACERONE) 200 MG tablet Take 200 mg by mouth daily.  Marland Kitchen atorvastatin (LIPITOR) 20 MG tablet TAKE 1 TABLET EVERY DAY (Patient taking differently: Take 20 mg by mouth daily)  . carvedilol (COREG) 25 MG tablet TAKE 2 TABLETS TWICE DAILY (Patient taking differently: Take 50 mg by mouth twice daily)  . escitalopram (LEXAPRO) 10 MG tablet TAKE 1 TABLET EVERY DAY (Patient taking differently: Take 10 mg by mouth daily)  . fenofibrate (TRICOR) 48 MG tablet TAKE 1 TABLET EVERY DAY (Patient taking differently: Take 48 mg by mouth daily)  . fluticasone (FLONASE) 50 MCG/ACT nasal  spray Place 2 sprays into both nostrils daily as needed for allergies or rhinitis. As needed for nasal stuffiness.  . gabapentin (NEURONTIN) 300 MG capsule Take 300 mg by mouth at bedtime.  Marland Kitchen glucose blood (FREESTYLE LITE) test strip DX: 250.60  Check sugars bid and as needed (Patient taking differently: 1 each by Other route See admin instructions. Check blood sugar twice daily)  . insulin NPH (HUMULIN N,NOVOLIN N) 100 UNIT/ML injection Inject 15-30 Units into the skin 2 (two) times daily before a meal. Take  20-30 units in the morning and 10-15 units in the evening  . insulin regular (NOVOLIN R,HUMULIN R) 100 units/mL injection Change to 15 units three times a with meals (to cover all meals--this is the same total amount but spread out) (Patient taking differently: Inject 10-15 Units into the skin 2 (two) times daily before a meal. Change to 15 units three times a with meals (to cover all meals--this is the same total amount but spread out))  . levothyroxine (SYNTHROID, LEVOTHROID) 25 MCG tablet TAKE 1 TABLET EVERY DAY BEFORE BREAKFAST (Patient taking differently: Take 25 mcg by mouth daily)  . LORazepam (ATIVAN) 0.5 MG tablet Take 1 tablet (0.5 mg total) by mouth at bedtime.  . OXYGEN Inhale 3 L into the lungs continuous.  Marland Kitchen rOPINIRole (REQUIP) 0.25 MG tablet Take 1 tablet (0.25 mg total) by mouth at bedtime.  . torsemide (DEMADEX) 20 MG tablet Take 2 tablets (40 mg total) by mouth daily.  . Vitamin D, Ergocalciferol, (DRISDOL) 50000 UNITS CAPS capsule Take 50,000 Units by mouth every Tuesday.   . warfarin (COUMADIN) 5 MG tablet TAKE AS DIRECTED BY ANTICOAGULATION CLINIC. (Patient taking differently: TAKES 1 TAB (5 mg) ON SUN AND THURS, TAKES 1/2 TAB (2.5 mg) ALL OTHER DAYS)   No facility-administered encounter medications on file as of 01/12/2016.     Activities of Daily Living In your present state of health, do you have any difficulty performing the following activities: 01/12/2016 01/09/2016    Hearing? N N  Vision? N N  Difficulty concentrating or making decisions? N N  Walking or climbing stairs? Y Y  Dressing or bathing? Y N  Doing errands, shopping? Y -  Conservation officer, nature and eating ? Y -  Using the Toilet? Y -  In the past six months, have you accidently leaked urine? Y -  Do you have problems with loss of bowel control? N -  Managing your Medications? N -  Managing your Finances? N -  Housekeeping or managing your Housekeeping? N -  Some recent data might be hidden    Patient Care Team: Mosie Lukes, MD as PCP - General (Family Medicine) Donato Heinz, MD as Consulting Physician (Nephrology) Evans Lance, MD as Consulting Physician (Cardiology) Larey Dresser, MD as Consulting Physician (Cardiology) Jacelyn Pi, MD as Consulting Physician (Endocrinology) Rigoberto Noel, MD as Consulting Physician (Pulmonary Disease)    Assessment:    Physical assessment deferred to PCP.  Exercise Activities and Dietary recommendations Current Exercise Habits: The patient does not participate in regular exercise at present, Exercise limited by: respiratory conditions(s) (Chronic respiratory failure)  Diet (meal preparation, eat out, water intake, caffeinated beverages, dairy products, fruits and vegetables): Trying to limit sodium intake. Does not cook. Trying to lose weight.  Breakfast: Cereal and OJ w/ medication Lunch: Sandwich and fruit w/ water. Dinner: [employer] Curd, may pick up something from restaurant     Goals    . Patient Stated          Independent w/ ADLs      Fall Risk Fall Risk  01/12/2016 08/31/2014  Falls in the past year? Yes Yes  Number falls in past yr: 1 1  Injury with Fall? No No   Depression Screen PHQ 2/9 Scores 01/12/2016 08/31/2014  PHQ - 2 Score 2 0  PHQ- 9 Score 3 -     Cognitive Testing MMSE - Mini Mental State Exam 01/12/2016  Orientation to time 5  Orientation to Place 5  Registration 3  Attention/ Calculation 5  Recall 3   Language- name 2 objects 2  Language- repeat 1  Language- follow 3 step command 3  Language- read & follow direction 1  Write a sentence 1  Copy design 1  Total score 30    Immunization History  Administered Date(s) Administered  . H1N1 05/17/2008  . Influenza Split 01/29/2012  . Influenza Whole 02/03/2007, 02/11/2009, 01/17/2010, 01/26/2013  . Influenza-Unspecified 01/12/2014, 02/13/2015  . Pneumococcal Conjugate-13 03/03/2015  . Pneumococcal Polysaccharide-23 12/23/2007, 03/11/2013  . Td 09/29/2001, 01/28/2012  . Tdap 10/01/2011  . Zoster 08/19/2007   Screening Tests Health Maintenance  Topic Date Due  . FOOT EXAM  10/13/2009  . URINE MICROALBUMIN  03/27/2012  . INFLUENZA VACCINE  12/13/2015  . MAMMOGRAM  01/11/2017 (Originally 01/03/2013)  . DEXA SCAN  01/11/2017 (Originally 09/20/2007)  . HEMOGLOBIN A1C  06/15/2016  . OPHTHALMOLOGY EXAM  08/24/2016  . COLONOSCOPY  04/02/2021  . TETANUS/TDAP  01/27/2022  . ZOSTAVAX  Completed  . PNA vac Low Risk Adult  Completed      Plan:    Continue to eat heart healthy diet (full of fruits, vegetables, whole grains, lean protein, water--limit salt, fat, and sugar intake) and increase physical activity as tolerated.  Continue doing brain stimulating activities (puzzles, reading, adult coloring books, staying active) to keep memory sharp.   Chair exercises as tolerated.   During the course of the visit the patient was educated and counseled about the following appropriate screening and preventive services:   Vaccines to include Pneumoccal, Influenza, Hepatitis B, Td, Zostavax, HCV  Cardiovascular Disease  Colorectal cancer screening  Bone density screening  Diabetes screening  Glaucoma screening  Mammography/PAP  Nutrition counseling   Patient Instructions (the written plan) was given to the patient.   Dorrene German, RN  01/12/2016  RN AWV note reviewed. Agree with documention and plan.  Penni Homans, MD

## 2016-01-12 NOTE — Progress Notes (Signed)
RN AWV note reviewed. Agree with documention and plan. Penni Homans, MD

## 2016-01-13 ENCOUNTER — Telehealth: Payer: Self-pay | Admitting: Family Medicine

## 2016-01-13 DIAGNOSIS — E662 Morbid (severe) obesity with alveolar hypoventilation: Secondary | ICD-10-CM | POA: Diagnosis not present

## 2016-01-13 DIAGNOSIS — E1122 Type 2 diabetes mellitus with diabetic chronic kidney disease: Secondary | ICD-10-CM | POA: Diagnosis not present

## 2016-01-13 DIAGNOSIS — I130 Hypertensive heart and chronic kidney disease with heart failure and stage 1 through stage 4 chronic kidney disease, or unspecified chronic kidney disease: Secondary | ICD-10-CM | POA: Diagnosis not present

## 2016-01-13 DIAGNOSIS — N183 Chronic kidney disease, stage 3 (moderate): Secondary | ICD-10-CM | POA: Diagnosis not present

## 2016-01-13 DIAGNOSIS — G4733 Obstructive sleep apnea (adult) (pediatric): Secondary | ICD-10-CM | POA: Diagnosis not present

## 2016-01-13 DIAGNOSIS — I5042 Chronic combined systolic (congestive) and diastolic (congestive) heart failure: Secondary | ICD-10-CM | POA: Diagnosis not present

## 2016-01-13 NOTE — Telephone Encounter (Signed)
Caller name:Amy  Relation to pt: PT from [place]  Call back number [phone]  Pharmacy:  Reason for call:  Wanted to inform PCP patient has oral temp. of 100.3 with no other symptoms. Please advise

## 2016-01-13 NOTE — Telephone Encounter (Signed)
If she is asymptomatic she should rest, increase fluids slightly and monitor every 4 hours or so. If spikes above 101 should consider getting looked at and/or if she gets symptomatic

## 2016-01-13 NOTE — Telephone Encounter (Signed)
Called [facility] left a detailed message of PCP instructions, but did ask her to call to confirm message received

## 2016-01-13 NOTE — Telephone Encounter (Signed)
Spoke to [facility] and message was received.

## 2016-01-16 DIAGNOSIS — I130 Hypertensive heart and chronic kidney disease with heart failure and stage 1 through stage 4 chronic kidney disease, or unspecified chronic kidney disease: Secondary | ICD-10-CM | POA: Diagnosis not present

## 2016-01-16 DIAGNOSIS — N183 Chronic kidney disease, stage 3 (moderate): Secondary | ICD-10-CM | POA: Diagnosis not present

## 2016-01-16 DIAGNOSIS — I5042 Chronic combined systolic (congestive) and diastolic (congestive) heart failure: Secondary | ICD-10-CM | POA: Diagnosis not present

## 2016-01-16 DIAGNOSIS — E662 Morbid (severe) obesity with alveolar hypoventilation: Secondary | ICD-10-CM | POA: Diagnosis not present

## 2016-01-16 DIAGNOSIS — E1122 Type 2 diabetes mellitus with diabetic chronic kidney disease: Secondary | ICD-10-CM | POA: Diagnosis not present

## 2016-01-16 DIAGNOSIS — G4733 Obstructive sleep apnea (adult) (pediatric): Secondary | ICD-10-CM | POA: Diagnosis not present

## 2016-01-18 ENCOUNTER — Ambulatory Visit (INDEPENDENT_AMBULATORY_CARE_PROVIDER_SITE_OTHER): Payer: Medicare Other | Admitting: Pharmacist

## 2016-01-18 ENCOUNTER — Inpatient Hospital Stay: Payer: Medicare Other | Admitting: Physician Assistant

## 2016-01-18 DIAGNOSIS — E1122 Type 2 diabetes mellitus with diabetic chronic kidney disease: Secondary | ICD-10-CM | POA: Diagnosis not present

## 2016-01-18 DIAGNOSIS — N183 Chronic kidney disease, stage 3 (moderate): Secondary | ICD-10-CM | POA: Diagnosis not present

## 2016-01-18 DIAGNOSIS — I480 Paroxysmal atrial fibrillation: Secondary | ICD-10-CM

## 2016-01-18 DIAGNOSIS — E662 Morbid (severe) obesity with alveolar hypoventilation: Secondary | ICD-10-CM | POA: Diagnosis not present

## 2016-01-18 DIAGNOSIS — I5042 Chronic combined systolic (congestive) and diastolic (congestive) heart failure: Secondary | ICD-10-CM | POA: Diagnosis not present

## 2016-01-18 DIAGNOSIS — Z5181 Encounter for therapeutic drug level monitoring: Secondary | ICD-10-CM

## 2016-01-18 DIAGNOSIS — I130 Hypertensive heart and chronic kidney disease with heart failure and stage 1 through stage 4 chronic kidney disease, or unspecified chronic kidney disease: Secondary | ICD-10-CM | POA: Diagnosis not present

## 2016-01-18 DIAGNOSIS — G4733 Obstructive sleep apnea (adult) (pediatric): Secondary | ICD-10-CM | POA: Diagnosis not present

## 2016-01-18 LAB — POCT INR: INR: 1.7

## 2016-01-19 ENCOUNTER — Encounter: Payer: Medicare Other | Admitting: Family Medicine

## 2016-01-19 DIAGNOSIS — E1122 Type 2 diabetes mellitus with diabetic chronic kidney disease: Secondary | ICD-10-CM | POA: Diagnosis not present

## 2016-01-19 DIAGNOSIS — I130 Hypertensive heart and chronic kidney disease with heart failure and stage 1 through stage 4 chronic kidney disease, or unspecified chronic kidney disease: Secondary | ICD-10-CM | POA: Diagnosis not present

## 2016-01-19 DIAGNOSIS — N183 Chronic kidney disease, stage 3 (moderate): Secondary | ICD-10-CM | POA: Diagnosis not present

## 2016-01-19 DIAGNOSIS — E662 Morbid (severe) obesity with alveolar hypoventilation: Secondary | ICD-10-CM | POA: Diagnosis not present

## 2016-01-19 DIAGNOSIS — I5042 Chronic combined systolic (congestive) and diastolic (congestive) heart failure: Secondary | ICD-10-CM | POA: Diagnosis not present

## 2016-01-19 DIAGNOSIS — G4733 Obstructive sleep apnea (adult) (pediatric): Secondary | ICD-10-CM | POA: Diagnosis not present

## 2016-01-20 ENCOUNTER — Telehealth: Payer: Self-pay | Admitting: Internal Medicine

## 2016-01-20 DIAGNOSIS — I130 Hypertensive heart and chronic kidney disease with heart failure and stage 1 through stage 4 chronic kidney disease, or unspecified chronic kidney disease: Secondary | ICD-10-CM | POA: Diagnosis not present

## 2016-01-20 DIAGNOSIS — E1122 Type 2 diabetes mellitus with diabetic chronic kidney disease: Secondary | ICD-10-CM | POA: Diagnosis not present

## 2016-01-20 DIAGNOSIS — E662 Morbid (severe) obesity with alveolar hypoventilation: Secondary | ICD-10-CM | POA: Diagnosis not present

## 2016-01-20 DIAGNOSIS — G4733 Obstructive sleep apnea (adult) (pediatric): Secondary | ICD-10-CM | POA: Diagnosis not present

## 2016-01-20 DIAGNOSIS — I5042 Chronic combined systolic (congestive) and diastolic (congestive) heart failure: Secondary | ICD-10-CM | POA: Diagnosis not present

## 2016-01-20 DIAGNOSIS — N183 Chronic kidney disease, stage 3 (moderate): Secondary | ICD-10-CM | POA: Diagnosis not present

## 2016-01-20 NOTE — Telephone Encounter (Signed)
Returned call to Kathlee Nations, [place] left a message for her to recheck pt's INR on 01/26/16 & call to [facility].  I left a message & phone call for RN to callback & call results, too.  Also, called the pt & informed her that I left a message for the nurse to check her INR on 01/26/16.

## 2016-01-20 NOTE — Telephone Encounter (Signed)
New Message:     She needs an order to do PT INR on 9-12 or 9-14 please.

## 2016-01-22 ENCOUNTER — Encounter: Payer: Self-pay | Admitting: Family Medicine

## 2016-01-22 DIAGNOSIS — J449 Chronic obstructive pulmonary disease, unspecified: Secondary | ICD-10-CM

## 2016-01-22 HISTORY — DX: Chronic obstructive pulmonary disease, unspecified: J44.9

## 2016-01-22 NOTE — Assessment & Plan Note (Signed)
Well controlled, no changes to meds. Encouraged heart healthy diet such as the DASH diet and exercise as tolerated.  °

## 2016-01-22 NOTE — Assessment & Plan Note (Signed)
hgba1c acceptable, minimize simple carbs. Increase exercise as tolerated. Continue current meds 

## 2016-01-22 NOTE — Assessment & Plan Note (Deleted)
Patient denies any difficulties at home. No trouble with ADLs, depression or falls. See EMR for functional status screen and depression screen. No recent changes to vision or hearing. Is UTD with immunizations. Is UTD with screening. Discussed Advanced Directives. Encouraged heart healthy diet, exercise as tolerated and adequate sleep. See patient's problem list for health risk factors to monitor. See AVS for preventative healthcare recommendation schedule. Given and reviewed copy of ACP documents from [place] Secretary of State and encouraged to complete and return 

## 2016-01-22 NOTE — Progress Notes (Signed)
[name], female   DOB: 29-Aug-1940, 75 y.o.   MRN: [id number]   Subjective:    [name], female    DOB: May 09, 1941, 75 y.o.   MRN: [id number]  Chief Complaint  Patient presents with  . Medicare Wellness  . Hospitalization Follow-up  . Blister    Water blister on back of neck from BiPap strap  . COPD    HPI Patient is in today for hospital follow up. She suffered with respiratory failure on Bipap so they sent her home on Non invasive ventilator which she is tolerating except for some irritation on her skin from the strap. Denies CP/palp/HA/congestion/fevers/GI or GU c/o. Taking meds as prescribed  Past Medical History:  Diagnosis Date  . AICD (automatic cardioverter/defibrillator) present   . Anemia, unspecified 12/13/2013  . Back pain, chronic    "just when I walk; mass on 3rd and 4th vertebrae right lower back"  . Biventricular implantable cardiac defibrillator in situ 2007, 2012   a. 2007;  b. 2012 Gen change: SJM 3231-40 Uni BiV ICD, ser # [id number].  Marland Kitchen CAD (coronary artery disease)    a. 05/2001 CABG x4: LIMA->LAD, VG->D1, VG->D2, VG->RCA.  . Cardiomyopathy, ischemic    a. 2012 s/p SJM 3231-40 Uni BiV ICD, ser # [id number].  . Carotid stenosis    a. 10/19/2011 carotid duplex - Mild hard plaque bilaterally. Stable 40-59% bilateral ICA stenosis. Carotid US (04/2013):  Bilateral 40-59% ICA.  F/u 1 year  . Cerumen impaction 10/28/2015  . Chronic combined systolic and diastolic CHF (congestive heart failure) (HCC)    a. EF as low as 25% in 2006;  b. EF 60-65% in 12/2011;  c. 02/2013 Echo: EF 35-40, mod mid-dist antsept HK, Gr 2 DD, mild LVH.  . CKD (chronic kidney disease), stage III   . COPD (chronic obstructive pulmonary disease) ([place])   . COPD, severe ([place]) 01/22/2016  . 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 Dyslipidemia   . Gout ~ 08/2011  . Hypercalcemia 09/26/2013  . Hypertension   .  Hypothyroidism   . Hypoxemia requiring supplemental oxygen   . LBBB (left bundle branch block)   . Morbid obesity with BMI of 45.0-49.9, adult (HCC)    Ht. 5'4". BMI 47.2  . Mural thrombus of left ventricle (HCC)    before 2003, while on Coumadin, No h/o CVA  . 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 Benay Spice, in remission  . On home oxygen therapy    "normally 2-3L; 24/7" (12/14/2015)  . OSA on CPAP   . PAF (paroxysmal atrial fibrillation) (HCC)    on Coumadin  . Presence of permanent cardiac pacemaker   . Retinopathy due to secondary diabetes ([place])    type II, uncontrolled  . Vitamin D deficiency 06/09/2012   historical    Past Surgical History:  Procedure Laterality Date  . ABDOMINAL HYSTERECTOMY  1982  . CARDIAC CATHETERIZATION  06/03/01  . CARDIOVERSION N/A 07/30/2014   Procedure: CARDIOVERSION;  Surgeon: Jolaine Artist, MD;  Location[facility] ENDOSCOPY;  Service: Cardiovascular;  Laterality: N/A;  . CARDIOVERSION N/A 11/25/2014   Procedure: CARDIOVERSION;  Surgeon: Evans Lance, MD;  Location: [place];  Service: Cardiovascular;  Laterality: N/A;  . CATARACT EXTRACTION     left eye  . CHOLECYSTECTOMY  1970  . CORONARY ARTERY BYPASS GRAFT  2003   CABG X4  .  INSERT / REPLACE / REMOVE PACEMAKER  2007; 2012   w/AICD  . REFRACTIVE SURGERY     right eye  . TEE WITHOUT CARDIOVERSION N/A 12/16/2015   Procedure: TRANSESOPHAGEAL ECHOCARDIOGRAM (TEE);  Surgeon: Jolaine Artist, MD;  Location: [facility] ENDOSCOPY;  Service: Cardiovascular;  Laterality: N/A;  . TUBAL LIGATION  1972    Family History  Problem Relation Age of Onset  . Kidney cancer Mother     kidney and female repo - died @ 93  . Asthma Mother   . Cancer Mother     gyn and renal  . Heart disease Mother     CAD  . Heart disease Father     died @ 33  . Stroke Father   . Diabetes Father   . Hypertension Father   . Hyperlipidemia Father   . Heart attack Father   .  Hypertension Son   . Kidney cancer Maternal Uncle   . Cancer Maternal Uncle   . Cirrhosis Maternal Grandmother     non alcohol  . Cancer Maternal Grandfather   . Kidney cancer Maternal Grandfather   . Hypertension Maternal Aunt     Social History   Social History  . Marital status: Married    Spouse name: N/A  . Number of children: Y  . Years of education: N/A   Occupational History  . 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    Social History Main Topics  . Smoking status: Never Smoker  . Smokeless tobacco: Never Used  . Alcohol use No  . Drug use: No  . Sexual activity: Not Currently     Comment: lives with husband, no major dietary restrictions   Other Topics Concern  . Not on file   Social History Narrative   Lives in [place] with her husband.  She does not routinely exercise or adhere to any particular diet.      Outpatient Medications Prior to Visit  Medication Sig Dispense Refill  . acetaminophen (TYLENOL) 500 MG tablet Take 1,000 mg by mouth 2 (two) times daily as needed for mild pain.     Marland Kitchen albuterol (PROVENTIL HFA;VENTOLIN HFA) 108 (90 BASE) MCG/ACT inhaler Inhale 2 puffs into the lungs every 6 (six) hours as needed for wheezing or shortness of breath. 1 Inhaler 1  . amiodarone (PACERONE) 200 MG tablet Take 200 mg by mouth daily.    Marland Kitchen atorvastatin (LIPITOR) 20 MG tablet TAKE 1 TABLET EVERY DAY (Patient taking differently: Take 20 mg by mouth daily) 90 tablet 1  . carvedilol (COREG) 25 MG tablet TAKE 2 TABLETS TWICE DAILY (Patient taking differently: Take 50 mg by mouth twice daily) 360 tablet 3  . escitalopram (LEXAPRO) 10 MG tablet TAKE 1 TABLET EVERY DAY (Patient taking differently: Take 10 mg by mouth daily) 90 tablet 1  . fenofibrate (TRICOR) 48 MG tablet TAKE 1 TABLET EVERY DAY (Patient taking differently: Take 48 mg by mouth daily) 90 tablet 3  . fluticasone (FLONASE) 50 MCG/ACT nasal spray Place 2 sprays into both nostrils daily as needed for  allergies or rhinitis. As needed for nasal stuffiness. 16 g 0  . gabapentin (NEURONTIN) 300 MG capsule Take 300 mg by mouth at bedtime.    Marland Kitchen glucose blood (FREESTYLE LITE) test strip DX: 250.60  Check sugars bid and as needed (Patient taking differently: 1 each by Other route See admin instructions. Check blood sugar twice daily) 100 each 2  . insulin NPH (HUMULIN N,NOVOLIN  N) 100 UNIT/ML injection Inject 15-30 Units into the skin 2 (two) times daily before a meal. Take 20-30 units in the morning and 10-15 units in the evening    . insulin regular (NOVOLIN R,HUMULIN R) 100 units/mL injection Change to 15 units three times a with meals (to cover all meals--this is the same total amount but spread out) (Patient taking differently: Inject 10-15 Units into the skin 2 (two) times daily before a meal. Change to 15 units three times a with meals (to cover all meals--this is the same total amount but spread out)) 10 mL 12  . levothyroxine (SYNTHROID, LEVOTHROID) 25 MCG tablet TAKE 1 TABLET EVERY DAY BEFORE BREAKFAST (Patient taking differently: Take 25 mcg by mouth daily) 90 tablet 1  . LORazepam (ATIVAN) 0.5 MG tablet Take 1 tablet (0.5 mg total) by mouth at bedtime. 90 tablet 1  . OXYGEN Inhale 3 L into the lungs continuous.    Marland Kitchen rOPINIRole (REQUIP) 0.25 MG tablet Take 1 tablet (0.25 mg total) by mouth at bedtime. 30 tablet 1  . torsemide (DEMADEX) 20 MG tablet Take 2 tablets (40 mg total) by mouth daily. 30 tablet 2  . Vitamin D, Ergocalciferol, (DRISDOL) 50000 UNITS CAPS capsule Take 50,000 Units by mouth every Tuesday.     . warfarin (COUMADIN) 5 MG tablet TAKE AS DIRECTED BY ANTICOAGULATION CLINIC. (Patient taking differently: TAKES 1 TAB (5 mg) ON SUN AND THURS, TAKES 1/2 TAB (2.5 mg) ALL OTHER DAYS) 70 tablet 1   No facility-administered medications prior to visit.     Allergies  Allergen Reactions  . Sulfonamide Derivatives Other (See Comments)    Unknown; "childhood allergy/mother"     Review of Systems  Constitutional: Positive for malaise/fatigue. Negative for fever.  HENT: Negative for congestion.   Eyes: Negative for blurred vision.  Respiratory: Positive for shortness of breath.   Cardiovascular: Negative for chest pain, palpitations and leg swelling.  Gastrointestinal: Negative for abdominal pain, blood in stool and nausea.  Genitourinary: Negative for dysuria and frequency.  Musculoskeletal: Negative for falls.  Skin: Positive for rash.  Neurological: Positive for weakness. Negative for dizziness, loss of consciousness and headaches.  Endo/Heme/Allergies: Negative for environmental allergies.  Psychiatric/Behavioral: Negative for depression. The patient is not nervous/anxious.        Objective:    Physical Exam  Constitutional: She is oriented to person, place, and time. She appears well-developed and well-nourished. No distress.  HENT:  Head: Normocephalic and atraumatic.  Nose: Nose normal.  Eyes: Right eye exhibits no discharge. Left eye exhibits no discharge.  Neck: Normal range of motion. Neck supple.  Cardiovascular: Normal rate and regular rhythm.   Murmur heard. Pulmonary/Chest: Effort normal and breath sounds normal.  Abdominal: Soft. Bowel sounds are normal. There is no tenderness.  Musculoskeletal: She exhibits no edema.  Neurological: She is alert and oriented to person, place, and time.  Skin: Skin is warm and dry. Rash noted.  Erythematous rash along back of neck.   Psychiatric: She has a normal mood and affect.  Nursing note and vitals reviewed.   BP 136/74 (BP Location: Right Arm, Patient Position: Sitting, Cuff Size: Large)   Pulse 70   Temp 98.7 F (37.1 C) (Oral)   Resp 18   Wt 258 lb 9.6 oz (117.3 kg)   SpO2 91%   BMI 44.39 kg/m  Wt Readings from Last 3 Encounters:  01/12/16 258 lb 9.6 oz (117.3 kg)  01/09/16 261 lb 14.5 oz (118.8 kg)  12/28/15  268 lb (121.6 kg)     Lab Results  Component Value Date   WBC 5.3  01/08/2016   HGB 9.0 (L) 01/08/2016   HCT 31.0 (L) 01/08/2016   PLT 172 01/08/2016   GLUCOSE 91 01/09/2016   CHOL 148 07/14/2015   TRIG 195.0 (H) 07/14/2015   HDL 40.30 07/14/2015   LDLDIRECT 64.8 05/11/2014   LDLCALC 68 07/14/2015   ALT 28 01/05/2016   AST 38 01/05/2016   NA 138 01/09/2016   K 4.2 01/09/2016   CL 94 (L) 01/09/2016   CREATININE 2.33 (H) 01/09/2016   BUN 61 (H) 01/09/2016   CO2 35 (H) 01/09/2016   TSH 4.270 12/15/2015   INR 1.7 01/18/2016   HGBA1C 6.6 (H) 12/14/2015   MICROALBUR 77.0 (H) 03/28/2011    Lab Results  Component Value Date   TSH 4.270 12/15/2015   Lab Results  Component Value Date   WBC 5.3 01/08/2016   HGB 9.0 (L) 01/08/2016   HCT 31.0 (L) 01/08/2016   MCV 101.0 (H) 01/08/2016   PLT 172 01/08/2016   Lab Results  Component Value Date   NA 138 01/09/2016   K 4.2 01/09/2016   CO2 35 (H) 01/09/2016   GLUCOSE 91 01/09/2016   BUN 61 (H) 01/09/2016   CREATININE 2.33 (H) 01/09/2016   BILITOT 1.0 01/05/2016   ALKPHOS 38 01/05/2016   AST 38 01/05/2016   ALT 28 01/05/2016   PROT 5.9 (L) 01/05/2016   ALBUMIN 3.0 (L) 01/05/2016   CALCIUM 8.9 01/09/2016   ANIONGAP 9 01/09/2016   GFR 18.77 (L) 07/14/2015   Lab Results  Component Value Date   CHOL 148 07/14/2015   Lab Results  Component Value Date   HDL 40.30 07/14/2015   Lab Results  Component Value Date   LDLCALC 68 07/14/2015   Lab Results  Component Value Date   TRIG 195.0 (H) 07/14/2015   Lab Results  Component Value Date   CHOLHDL 4 07/14/2015   Lab Results  Component Value Date   HGBA1C 6.6 (H) 12/14/2015       Assessment & Plan:   Problem List Items Addressed This Visit    Controlled type 2 diabetes mellitus with chronic kidney disease ([place]) (Chronic)    hgba1c acceptable, minimize simple carbs. Increase exercise as tolerated. Continue current meds      Essential hypertension    Well controlled, no changes to meds. Encouraged heart healthy diet such as the  DASH diet and exercise as tolerated.       Anemia   Relevant Orders   Comprehensive metabolic panel   CBC with Differential/Platelet   Medicare annual wellness visit, subsequent - Primary   Chronic respiratory failure ([place])    Was previously on Bipap but was recently hospitalized with respiratory failure and was sent home on a non invasive ventilator for her COPD and respiratory failure. She is tolerating the ventilator but the elastic strap is irritaing her skin. She is instructed to put some keflex under the strap. Will support orders via [employer] to keep her ventilator going.       COPD, severe ([place])    Is in need of non invasive ventilator support nightly.        Other Visit Diagnoses    Routine history and physical examination of adult       Relevant Orders   Comprehensive metabolic panel   CBC with Differential/Platelet   Renal insufficiency       Relevant Orders  Comprehensive metabolic panel   CBC with Differential/Platelet      I am having Ms. Martindale maintain her albuterol, insulin NPH Human, insulin regular, glucose blood, Vitamin D (Ergocalciferol), fluticasone, acetaminophen, warfarin, carvedilol, LORazepam, atorvastatin, fenofibrate, escitalopram, levothyroxine, OXYGEN, rOPINIRole, torsemide, gabapentin, and amiodarone.  No orders of the defined types were placed in this encounter.    Penni Homans, MD

## 2016-01-22 NOTE — Assessment & Plan Note (Signed)
Is in need of non invasive ventilator support nightly.

## 2016-01-22 NOTE — Assessment & Plan Note (Signed)
Was previously on Bipap but was recently hospitalized with respiratory failure and was sent home on a non invasive ventilator for her COPD and respiratory failure. She is tolerating the ventilator but the elastic strap is irritaing her skin. She is instructed to put some keflex under the strap. Will support orders via [employer] to keep her ventilator going.

## 2016-01-23 ENCOUNTER — Ambulatory Visit: Payer: Medicare Other | Admitting: *Deleted

## 2016-01-23 ENCOUNTER — Encounter: Payer: Medicare Other | Admitting: Family Medicine

## 2016-01-23 ENCOUNTER — Ambulatory Visit: Payer: Medicare Other

## 2016-01-24 DIAGNOSIS — E662 Morbid (severe) obesity with alveolar hypoventilation: Secondary | ICD-10-CM | POA: Diagnosis not present

## 2016-01-24 DIAGNOSIS — E1122 Type 2 diabetes mellitus with diabetic chronic kidney disease: Secondary | ICD-10-CM | POA: Diagnosis not present

## 2016-01-24 DIAGNOSIS — G4733 Obstructive sleep apnea (adult) (pediatric): Secondary | ICD-10-CM | POA: Diagnosis not present

## 2016-01-24 DIAGNOSIS — I5042 Chronic combined systolic (congestive) and diastolic (congestive) heart failure: Secondary | ICD-10-CM | POA: Diagnosis not present

## 2016-01-24 DIAGNOSIS — I130 Hypertensive heart and chronic kidney disease with heart failure and stage 1 through stage 4 chronic kidney disease, or unspecified chronic kidney disease: Secondary | ICD-10-CM | POA: Diagnosis not present

## 2016-01-24 DIAGNOSIS — N183 Chronic kidney disease, stage 3 (moderate): Secondary | ICD-10-CM | POA: Diagnosis not present

## 2016-01-25 ENCOUNTER — Ambulatory Visit (INDEPENDENT_AMBULATORY_CARE_PROVIDER_SITE_OTHER): Payer: Medicare Other | Admitting: Adult Health

## 2016-01-25 ENCOUNTER — Telehealth: Payer: Self-pay

## 2016-01-25 ENCOUNTER — Encounter: Payer: Self-pay | Admitting: Adult Health

## 2016-01-25 ENCOUNTER — Ambulatory Visit (INDEPENDENT_AMBULATORY_CARE_PROVIDER_SITE_OTHER): Payer: Medicare Other

## 2016-01-25 VITALS — BP 114/62 | HR 76 | Ht 64.0 in | Wt 248.0 lb

## 2016-01-25 DIAGNOSIS — I5022 Chronic systolic (congestive) heart failure: Secondary | ICD-10-CM

## 2016-01-25 DIAGNOSIS — I6350 Cerebral infarction due to unspecified occlusion or stenosis of unspecified cerebral artery: Secondary | ICD-10-CM

## 2016-01-25 DIAGNOSIS — Z23 Encounter for immunization: Secondary | ICD-10-CM

## 2016-01-25 DIAGNOSIS — G4733 Obstructive sleep apnea (adult) (pediatric): Secondary | ICD-10-CM | POA: Diagnosis not present

## 2016-01-25 DIAGNOSIS — J9611 Chronic respiratory failure with hypoxia: Secondary | ICD-10-CM

## 2016-01-25 DIAGNOSIS — Z95810 Presence of automatic (implantable) cardiac defibrillator: Secondary | ICD-10-CM

## 2016-01-25 NOTE — Telephone Encounter (Signed)
Remote ICM transmission received.  Attempted patient call and left message for return call.   

## 2016-01-25 NOTE — Progress Notes (Signed)
Chief Complaint  Patient presents with  . Hospitalization Follow-up    Discharged on 01/09/2016     Tests 2D echo 12/19/15>>> - Echo limited to parasternal viewes   EF appears normal. AV sclerosis   MAC with mild MR   No effusion.  Past medical hx Past Medical History:  Diagnosis Date  . AICD (automatic cardioverter/defibrillator) present   . Anemia, unspecified 12/13/2013  . Back pain, chronic    "just when I walk; mass on 3rd and 4th vertebrae right lower back"  . Biventricular implantable cardiac defibrillator in situ 2007, 2012   a. 2007;  b. 2012 Gen change: SJM 3231-40 Uni BiV ICD, ser # [id number].  Marland Kitchen CAD (coronary artery disease)    a. 05/2001 CABG x4: LIMA->LAD, VG->D1, VG->D2, VG->RCA.  . Cardiomyopathy, ischemic    a. 2012 s/p SJM 3231-40 Uni BiV ICD, ser # [id number].  . Carotid stenosis    a. 10/19/2011 carotid duplex - Mild hard plaque bilaterally. Stable 40-59% bilateral ICA stenosis. Carotid US (04/2013):  Bilateral 40-59% ICA.  F/u 1 year  . Cerumen impaction 10/28/2015  . Chronic combined systolic and diastolic CHF (congestive heart failure) (HCC)    a. EF as low as 25% in 2006;  b. EF 60-65% in 12/2011;  c. 02/2013 Echo: EF 35-40, mod mid-dist antsept HK, Gr 2 DD, mild LVH.  . CKD (chronic kidney disease), stage III   . COPD (chronic obstructive pulmonary disease) ([place])   . COPD, severe ([place]) 01/22/2016  . 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 Dyslipidemia   . Gout ~ 08/2011  . Hypercalcemia 09/26/2013  . Hypertension   . Hypothyroidism   . Hypoxemia requiring supplemental oxygen   . LBBB (left bundle branch block)   . Morbid obesity with BMI of 45.0-49.9, adult (HCC)    Ht. 5'4". BMI 47.2  . Mural thrombus of left ventricle (HCC)    before 2003, while on Coumadin, No h/o CVA  . 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 Benay Spice, in remission  . On home  oxygen therapy    "normally 2-3L; 24/7" (12/14/2015)  . OSA on CPAP   . PAF (paroxysmal atrial fibrillation) (HCC)    on Coumadin  . Presence of permanent cardiac pacemaker   . Retinopathy due to secondary diabetes ([place])    type II, uncontrolled  . Vitamin D deficiency 06/09/2012   historical     Past surgical hx, Allergies, Family hx, Social hx all reviewed.  Current Outpatient Prescriptions on File Prior to Visit  Medication Sig  . acetaminophen (TYLENOL) 500 MG tablet Take 1,000 mg by mouth 2 (two) times daily as needed for mild pain.   Marland Kitchen albuterol (PROVENTIL HFA;VENTOLIN HFA) 108 (90 BASE) MCG/ACT inhaler Inhale 2 puffs into the lungs every 6 (six) hours as needed for wheezing or shortness of breath.  Marland Kitchen amiodarone (PACERONE) 200 MG tablet Take 200 mg by mouth daily.  Marland Kitchen atorvastatin (LIPITOR) 20 MG tablet TAKE 1 TABLET EVERY DAY (Patient taking differently: Take 20 mg by mouth daily)  . carvedilol (COREG) 25 MG tablet TAKE 2 TABLETS TWICE DAILY (Patient taking differently: Take 50 mg by mouth twice daily)  . escitalopram (LEXAPRO) 10 MG tablet TAKE 1 TABLET EVERY DAY (Patient taking differently: Take 10 mg by mouth daily)  . fenofibrate (TRICOR) 48 MG tablet TAKE 1 TABLET EVERY DAY (Patient taking differently: Take 48 mg  by mouth daily)  . fluticasone (FLONASE) 50 MCG/ACT nasal spray Place 2 sprays into both nostrils daily as needed for allergies or rhinitis. As needed for nasal stuffiness.  . gabapentin (NEURONTIN) 300 MG capsule Take 300 mg by mouth at bedtime.  Marland Kitchen glucose blood (FREESTYLE LITE) test strip DX: 250.60  Check sugars bid and as needed (Patient taking differently: 1 each by Other route See admin instructions. Check blood sugar twice daily)  . insulin NPH (HUMULIN N,NOVOLIN N) 100 UNIT/ML injection Inject 15-30 Units into the skin 2 (two) times daily before a meal. Take 20-30 units in the morning and 10-15 units in the evening  . insulin regular (NOVOLIN R,HUMULIN R) 100  units/mL injection Change to 15 units three times a with meals (to cover all meals--this is the same total amount but spread out) (Patient taking differently: Inject 10-15 Units into the skin 2 (two) times daily before a meal. Change to 15 units three times a with meals (to cover all meals--this is the same total amount but spread out))  . levothyroxine (SYNTHROID, LEVOTHROID) 25 MCG tablet TAKE 1 TABLET EVERY DAY BEFORE BREAKFAST (Patient taking differently: Take 25 mcg by mouth daily)  . LORazepam (ATIVAN) 0.5 MG tablet Take 1 tablet (0.5 mg total) by mouth at bedtime.  . OXYGEN Inhale 3 L into the lungs continuous.  Marland Kitchen rOPINIRole (REQUIP) 0.25 MG tablet Take 1 tablet (0.25 mg total) by mouth at bedtime.  . torsemide (DEMADEX) 20 MG tablet Take 2 tablets (40 mg total) by mouth daily.  . Vitamin D, Ergocalciferol, (DRISDOL) 50000 UNITS CAPS capsule Take 50,000 Units by mouth every Tuesday.   . warfarin (COUMADIN) 5 MG tablet TAKE AS DIRECTED BY ANTICOAGULATION CLINIC. (Patient taking differently: TAKES 1 TAB (5 mg) ON SUN AND THURS, TAKES 1/2 TAB (2.5 mg) ALL OTHER DAYS)   No current facility-administered medications on file prior to visit.      Vital Signs BP 114/62 (BP Location: Left Arm, Cuff Size: Normal)   Pulse 76   Ht 5\' 4"  (1.626 m)   Wt 248 lb (112.5 kg)   SpO2 95%   BMI 42.57 kg/m   History of Present Illness Destiny [name] is a 75 y.o. female with hx untreated OSA/OHS, chronic diastolic heart failure, HTN, CKD with recent hospital admission 8/20-8/28 for acute on chronic respiratory failure secondary to acute on chronic combined CHF, decompensated OSA/OHS and resultant secondary PAH with decompensated cor pulmonale.  She improved with volume removal and bipap and was discharged with nocturnal bipap 12/6.   She returns today for hospital follow up.  She is feeling drastically better.  BLE edema is resolved.  SOB is much better.  Has been wearing qhs bipap although she continues to  work with home health on getting a better fitting head piece.  She has been wearing it every night. Weighs herself daily.  Would like a flu shot.  Denies chest pain, cough, BLE edema, SOB at rest.    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 Elsworth Soho in 4-6 months  Please contact office for sooner follow up if symptoms do not improve or worsen or seek emergency care      Nickolas Madrid, NP 01/25/2016  10:23 AM

## 2016-01-25 NOTE — Patient Instructions (Addendum)
Continue wearing bipap every night Wear oxygen at all times  Increase activity as you are able  Follow up with Dr. Elsworth Soho in 4-6 months  Please contact office for sooner follow up if symptoms do not improve or worsen or seek emergency care

## 2016-01-25 NOTE — Progress Notes (Signed)
EPIC Encounter for ICM Monitoring  Patient Name: Destiny [name] is a 75 y.o. female Date: 01/25/2016 Primary Care Physican: Penni Homans, MD Primary Cardiologist:Bensimhon Electrophysiologist: Lovena Le Dry Weight:    unknown  Bi-V Pacing:  >99%           Attempted ICM call and unable to reach.  Transmission reviewed.   Thoracic impedance normal and above baseline suggesting may be dryness.  Follow-up plan: ICM clinic phone appointment on 02/17/2016.  Copy of ICM check sent to cardiologist and device physician.   ICM trend: 01/25/2016       [name], RN 01/25/2016 10:53 AM

## 2016-01-26 ENCOUNTER — Ambulatory Visit (INDEPENDENT_AMBULATORY_CARE_PROVIDER_SITE_OTHER): Payer: Medicare Other | Admitting: Cardiovascular Disease

## 2016-01-26 DIAGNOSIS — I5042 Chronic combined systolic (congestive) and diastolic (congestive) heart failure: Secondary | ICD-10-CM | POA: Diagnosis not present

## 2016-01-26 DIAGNOSIS — G4733 Obstructive sleep apnea (adult) (pediatric): Secondary | ICD-10-CM | POA: Diagnosis not present

## 2016-01-26 DIAGNOSIS — I130 Hypertensive heart and chronic kidney disease with heart failure and stage 1 through stage 4 chronic kidney disease, or unspecified chronic kidney disease: Secondary | ICD-10-CM | POA: Diagnosis not present

## 2016-01-26 DIAGNOSIS — I480 Paroxysmal atrial fibrillation: Secondary | ICD-10-CM

## 2016-01-26 DIAGNOSIS — Z5181 Encounter for therapeutic drug level monitoring: Secondary | ICD-10-CM

## 2016-01-26 DIAGNOSIS — E662 Morbid (severe) obesity with alveolar hypoventilation: Secondary | ICD-10-CM | POA: Diagnosis not present

## 2016-01-26 DIAGNOSIS — N183 Chronic kidney disease, stage 3 (moderate): Secondary | ICD-10-CM | POA: Diagnosis not present

## 2016-01-26 DIAGNOSIS — E1122 Type 2 diabetes mellitus with diabetic chronic kidney disease: Secondary | ICD-10-CM | POA: Diagnosis not present

## 2016-01-26 LAB — POCT INR: INR: 2.2

## 2016-01-31 DIAGNOSIS — E1122 Type 2 diabetes mellitus with diabetic chronic kidney disease: Secondary | ICD-10-CM | POA: Diagnosis not present

## 2016-01-31 DIAGNOSIS — I5042 Chronic combined systolic (congestive) and diastolic (congestive) heart failure: Secondary | ICD-10-CM | POA: Diagnosis not present

## 2016-01-31 DIAGNOSIS — N183 Chronic kidney disease, stage 3 (moderate): Secondary | ICD-10-CM | POA: Diagnosis not present

## 2016-01-31 DIAGNOSIS — G4733 Obstructive sleep apnea (adult) (pediatric): Secondary | ICD-10-CM | POA: Diagnosis not present

## 2016-01-31 DIAGNOSIS — E662 Morbid (severe) obesity with alveolar hypoventilation: Secondary | ICD-10-CM | POA: Diagnosis not present

## 2016-01-31 DIAGNOSIS — I130 Hypertensive heart and chronic kidney disease with heart failure and stage 1 through stage 4 chronic kidney disease, or unspecified chronic kidney disease: Secondary | ICD-10-CM | POA: Diagnosis not present

## 2016-02-02 ENCOUNTER — Ambulatory Visit (INDEPENDENT_AMBULATORY_CARE_PROVIDER_SITE_OTHER): Payer: Medicare Other | Admitting: Cardiovascular Disease

## 2016-02-02 DIAGNOSIS — G4733 Obstructive sleep apnea (adult) (pediatric): Secondary | ICD-10-CM | POA: Diagnosis not present

## 2016-02-02 DIAGNOSIS — I5042 Chronic combined systolic (congestive) and diastolic (congestive) heart failure: Secondary | ICD-10-CM | POA: Diagnosis not present

## 2016-02-02 DIAGNOSIS — E662 Morbid (severe) obesity with alveolar hypoventilation: Secondary | ICD-10-CM | POA: Diagnosis not present

## 2016-02-02 DIAGNOSIS — E1122 Type 2 diabetes mellitus with diabetic chronic kidney disease: Secondary | ICD-10-CM | POA: Diagnosis not present

## 2016-02-02 DIAGNOSIS — I480 Paroxysmal atrial fibrillation: Secondary | ICD-10-CM

## 2016-02-02 DIAGNOSIS — I130 Hypertensive heart and chronic kidney disease with heart failure and stage 1 through stage 4 chronic kidney disease, or unspecified chronic kidney disease: Secondary | ICD-10-CM | POA: Diagnosis not present

## 2016-02-02 DIAGNOSIS — Z5181 Encounter for therapeutic drug level monitoring: Secondary | ICD-10-CM

## 2016-02-02 DIAGNOSIS — N183 Chronic kidney disease, stage 3 (moderate): Secondary | ICD-10-CM | POA: Diagnosis not present

## 2016-02-02 LAB — POCT INR: INR: 2.1

## 2016-02-06 DIAGNOSIS — J9612 Chronic respiratory failure with hypercapnia: Secondary | ICD-10-CM | POA: Diagnosis not present

## 2016-02-06 DIAGNOSIS — E669 Obesity, unspecified: Secondary | ICD-10-CM | POA: Diagnosis not present

## 2016-02-06 DIAGNOSIS — I129 Hypertensive chronic kidney disease with stage 1 through stage 4 chronic kidney disease, or unspecified chronic kidney disease: Secondary | ICD-10-CM | POA: Diagnosis not present

## 2016-02-06 DIAGNOSIS — D638 Anemia in other chronic diseases classified elsewhere: Secondary | ICD-10-CM | POA: Diagnosis not present

## 2016-02-06 DIAGNOSIS — D509 Iron deficiency anemia, unspecified: Secondary | ICD-10-CM | POA: Diagnosis not present

## 2016-02-06 DIAGNOSIS — N2581 Secondary hyperparathyroidism of renal origin: Secondary | ICD-10-CM | POA: Diagnosis not present

## 2016-02-06 DIAGNOSIS — N189 Chronic kidney disease, unspecified: Secondary | ICD-10-CM | POA: Diagnosis not present

## 2016-02-06 DIAGNOSIS — I4891 Unspecified atrial fibrillation: Secondary | ICD-10-CM | POA: Diagnosis not present

## 2016-02-06 DIAGNOSIS — I429 Cardiomyopathy, unspecified: Secondary | ICD-10-CM | POA: Diagnosis not present

## 2016-02-06 DIAGNOSIS — E1129 Type 2 diabetes mellitus with other diabetic kidney complication: Secondary | ICD-10-CM | POA: Diagnosis not present

## 2016-02-06 DIAGNOSIS — N184 Chronic kidney disease, stage 4 (severe): Secondary | ICD-10-CM | POA: Diagnosis not present

## 2016-02-06 DIAGNOSIS — G4733 Obstructive sleep apnea (adult) (pediatric): Secondary | ICD-10-CM | POA: Diagnosis not present

## 2016-02-06 DIAGNOSIS — J9611 Chronic respiratory failure with hypoxia: Secondary | ICD-10-CM | POA: Diagnosis not present

## 2016-02-09 DIAGNOSIS — I130 Hypertensive heart and chronic kidney disease with heart failure and stage 1 through stage 4 chronic kidney disease, or unspecified chronic kidney disease: Secondary | ICD-10-CM | POA: Diagnosis not present

## 2016-02-09 DIAGNOSIS — I5042 Chronic combined systolic (congestive) and diastolic (congestive) heart failure: Secondary | ICD-10-CM | POA: Diagnosis not present

## 2016-02-09 DIAGNOSIS — E662 Morbid (severe) obesity with alveolar hypoventilation: Secondary | ICD-10-CM | POA: Diagnosis not present

## 2016-02-09 DIAGNOSIS — G4733 Obstructive sleep apnea (adult) (pediatric): Secondary | ICD-10-CM | POA: Diagnosis not present

## 2016-02-09 DIAGNOSIS — E1122 Type 2 diabetes mellitus with diabetic chronic kidney disease: Secondary | ICD-10-CM | POA: Diagnosis not present

## 2016-02-09 DIAGNOSIS — N183 Chronic kidney disease, stage 3 (moderate): Secondary | ICD-10-CM | POA: Diagnosis not present

## 2016-02-13 ENCOUNTER — Other Ambulatory Visit (HOSPITAL_COMMUNITY): Payer: Self-pay | Admitting: *Deleted

## 2016-02-14 ENCOUNTER — Ambulatory Visit (HOSPITAL_COMMUNITY)
Admission: RE | Admit: 2016-02-14 | Discharge: 2016-02-14 | Disposition: A | Discharge status: Home / Self Care | Payer: Medicare Other | Source: Ambulatory Visit | Attending: Nephrology | Admitting: Nephrology

## 2016-02-14 ENCOUNTER — Ambulatory Visit: Payer: Medicare Other | Admitting: Pulmonary Disease

## 2016-02-14 DIAGNOSIS — D509 Iron deficiency anemia, unspecified: Secondary | ICD-10-CM | POA: Diagnosis not present

## 2016-02-14 MED ORDER — FERUMOXYTOL INJECTION 510 MG/17 ML
510.0000 mg | INTRAVENOUS | Status: DC
  Administered 2016-02-14: 510 mg via INTRAVENOUS
  Filled 2016-02-14: qty 17

## 2016-02-15 ENCOUNTER — Encounter: Payer: Self-pay | Admitting: Pulmonary Disease

## 2016-02-16 ENCOUNTER — Telehealth: Payer: Self-pay | Admitting: Internal Medicine

## 2016-02-16 ENCOUNTER — Ambulatory Visit (INDEPENDENT_AMBULATORY_CARE_PROVIDER_SITE_OTHER): Payer: Medicare Other | Admitting: Family Medicine

## 2016-02-16 ENCOUNTER — Encounter: Payer: Self-pay | Admitting: Family Medicine

## 2016-02-16 VITALS — BP 108/68 | HR 72 | Temp 98.3°F | Ht 64.0 in | Wt 247.5 lb

## 2016-02-16 DIAGNOSIS — J9622 Acute and chronic respiratory failure with hypercapnia: Secondary | ICD-10-CM

## 2016-02-16 DIAGNOSIS — L989 Disorder of the skin and subcutaneous tissue, unspecified: Secondary | ICD-10-CM

## 2016-02-16 DIAGNOSIS — M545 Low back pain: Secondary | ICD-10-CM

## 2016-02-16 DIAGNOSIS — I6350 Cerebral infarction due to unspecified occlusion or stenosis of unspecified cerebral artery: Secondary | ICD-10-CM | POA: Diagnosis not present

## 2016-02-16 DIAGNOSIS — Z794 Long term (current) use of insulin: Secondary | ICD-10-CM

## 2016-02-16 DIAGNOSIS — E11319 Type 2 diabetes mellitus with unspecified diabetic retinopathy without macular edema: Secondary | ICD-10-CM

## 2016-02-16 DIAGNOSIS — I10 Essential (primary) hypertension: Secondary | ICD-10-CM

## 2016-02-16 DIAGNOSIS — E782 Mixed hyperlipidemia: Secondary | ICD-10-CM

## 2016-02-16 MED ORDER — GABAPENTIN 300 MG PO CAPS: 600.0000 mg | ORAL_CAPSULE | Freq: Every day | ORAL | Status: DC

## 2016-02-16 MED ORDER — ROPINIROLE HCL 0.5 MG PO TABS: 0.5000 mg | ORAL_TABLET | Freq: Every day | ORAL | 1 refills | Status: DC

## 2016-02-16 NOTE — Telephone Encounter (Signed)
New message:    She needs a verbal order to check pt's PT tomorrow please.

## 2016-02-16 NOTE — Telephone Encounter (Signed)
Returned a call to Kathlee Nations with [facility] & gave orders to have pt's INR checked on 02/17/16 since pt has another Physician's appt today.

## 2016-02-16 NOTE — Patient Instructions (Addendum)
Tyenol/Acetaminophen 3000 mg in 24 hours max    Back Pain, Adult Back pain is very common in adults.The cause of back pain is rarely dangerous and the pain often gets better over time.The cause of your back pain may not be known. Some common causes of back pain include:  Strain of the muscles or ligaments supporting the spine.  Wear and tear (degeneration) of the spinal disks.  Arthritis.  Direct injury to the back. For many people, back pain may return. Since back pain is rarely dangerous, most people can learn to manage this condition on their own. HOME CARE INSTRUCTIONS Watch your back pain for any changes. The following actions may help to lessen any discomfort you are feeling:  Remain active. It is stressful on your back to sit or stand in one place for long periods of time. Do not sit, drive, or stand in one place for more than 30 minutes at a time. Take short walks on even surfaces as soon as you are able.Try to increase the length of time you walk each day.  Exercise regularly as directed by your health care provider. Exercise helps your back heal faster. It also helps avoid future injury by keeping your muscles strong and flexible.  Do not stay in bed.Resting more than 1-2 days can delay your recovery.  Pay attention to your body when you bend and lift. The most comfortable positions are those that put less stress on your recovering back. Always use proper lifting techniques, including:  Bending your knees.  Keeping the load close to your body.  Avoiding twisting.  Find a comfortable position to sleep. Use a firm mattress and lie on your side with your knees slightly bent. If you lie on your back, put a pillow under your knees.  Avoid feeling anxious or stressed.Stress increases muscle tension and can worsen back pain.It is important to recognize when you are anxious or stressed and learn ways to manage it, such as with exercise.  Take medicines only as directed by  your health care provider. Over-the-counter medicines to reduce pain and inflammation are often the most helpful.Your health care provider may prescribe muscle relaxant drugs.These medicines help dull your pain so you can more quickly return to your normal activities and healthy exercise.  Apply ice to the injured area:  Put ice in a plastic bag.  Place a towel between your skin and the bag.  Leave the ice on for 20 minutes, 2-3 times a day for the first 2-3 days. After that, ice and heat may be alternated to reduce pain and spasms.  Maintain a healthy weight. Excess weight puts extra stress on your back and makes it difficult to maintain good posture. SEEK MEDICAL CARE IF:  You have pain that is not relieved with rest or medicine.  You have increasing pain going down into the legs or buttocks.  You have pain that does not improve in one week.  You have night pain.  You lose weight.  You have a fever or chills. SEEK IMMEDIATE MEDICAL CARE IF:   You develop new bowel or bladder control problems.  You have unusual weakness or numbness in your arms or legs.  You develop nausea or vomiting.  You develop abdominal pain.  You feel faint.   This information is not intended to replace advice given to you by your health care provider. Make sure you discuss any questions you have with your health care provider.   Document Released: 04/30/2005 Document Revised:  05/21/2014 Document Reviewed: 09/01/2013 Elsevier Interactive Patient Education [employer].

## 2016-02-16 NOTE — Progress Notes (Signed)
Pre visit review using our clinic review tool, if applicable. No additional management support is needed unless otherwise documented below in the visit note. 

## 2016-02-17 ENCOUNTER — Ambulatory Visit (INDEPENDENT_AMBULATORY_CARE_PROVIDER_SITE_OTHER): Payer: Medicare Other

## 2016-02-17 ENCOUNTER — Ambulatory Visit (INDEPENDENT_AMBULATORY_CARE_PROVIDER_SITE_OTHER): Payer: Medicare Other | Admitting: Cardiology

## 2016-02-17 DIAGNOSIS — I5042 Chronic combined systolic (congestive) and diastolic (congestive) heart failure: Secondary | ICD-10-CM | POA: Diagnosis not present

## 2016-02-17 DIAGNOSIS — Z95810 Presence of automatic (implantable) cardiac defibrillator: Secondary | ICD-10-CM

## 2016-02-17 DIAGNOSIS — I5022 Chronic systolic (congestive) heart failure: Secondary | ICD-10-CM | POA: Diagnosis not present

## 2016-02-17 DIAGNOSIS — N183 Chronic kidney disease, stage 3 (moderate): Secondary | ICD-10-CM | POA: Diagnosis not present

## 2016-02-17 DIAGNOSIS — I480 Paroxysmal atrial fibrillation: Secondary | ICD-10-CM

## 2016-02-17 DIAGNOSIS — E662 Morbid (severe) obesity with alveolar hypoventilation: Secondary | ICD-10-CM | POA: Diagnosis not present

## 2016-02-17 DIAGNOSIS — Z5181 Encounter for therapeutic drug level monitoring: Secondary | ICD-10-CM

## 2016-02-17 DIAGNOSIS — I130 Hypertensive heart and chronic kidney disease with heart failure and stage 1 through stage 4 chronic kidney disease, or unspecified chronic kidney disease: Secondary | ICD-10-CM | POA: Diagnosis not present

## 2016-02-17 DIAGNOSIS — E1122 Type 2 diabetes mellitus with diabetic chronic kidney disease: Secondary | ICD-10-CM | POA: Diagnosis not present

## 2016-02-17 DIAGNOSIS — G4733 Obstructive sleep apnea (adult) (pediatric): Secondary | ICD-10-CM | POA: Diagnosis not present

## 2016-02-17 LAB — POCT INR: INR: 1.9

## 2016-02-17 NOTE — Progress Notes (Signed)
EPIC Encounter for ICM Monitoring  Patient Name: Destiny [name] is a 75 y.o. female Date: 02/17/2016 Primary Care Physican: Penni Homans, MD Primary Cardiologist:Bensimhon Electrophysiologist: Lovena Le Dry Weight: 245 lb  Bi-V Pacing:  >99%       Heart Failure questions reviewed, pt asymptomatic   Thoracic impedance normal   Recommendations: No changes.  Low sodium diet education provided.    Follow-up plan: ICM clinic phone appointment on 03/28/2016.  Copy of ICM check sent to device physician.   ICM trend: 02/17/2016       [name], RN 02/17/2016 9:04 AM

## 2016-02-18 DIAGNOSIS — L989 Disorder of the skin and subcutaneous tissue, unspecified: Secondary | ICD-10-CM | POA: Insufficient documentation

## 2016-02-18 NOTE — Assessment & Plan Note (Signed)
minimize simple carbs. Increase exercise as tolerated. Continue current meds, follows with Dr Chalmers Cater of endocrinology

## 2016-02-18 NOTE — Assessment & Plan Note (Signed)
Tolerating statin, encouraged heart healthy diet, avoid trans fats, minimize simple carbs and saturated fats. Increase exercise as tolerated 

## 2016-02-18 NOTE — Progress Notes (Signed)
[name], female   DOB: March 21, 1941, 75 y.o.   MRN: [id number]   Subjective:    [name], female    DOB: 01/11/1941, 75 y.o.   MRN: [id number]  Chief Complaint  Patient presents with  . Follow-up    HPI Patient is in today for follow up accompanied by her husband. She has been doing fairly well at home with her new noninvasive ventilator. She is noting recent flare in low back pain. She is having a hard time getting comfortable and getting some rest. No fevers or other acute new illness since last visit. Struggles with hip and leg discomfort as well. Denies CP/palp/HA/congestion/fevers/GI or GU c/o. Taking meds as prescribed  Past Medical History:  Diagnosis Date  . AICD (automatic cardioverter/defibrillator) present   . Anemia, unspecified 12/13/2013  . Back pain, chronic    "just when I walk; mass on 3rd and 4th vertebrae right lower back"  . Biventricular implantable cardiac defibrillator in situ 2007, 2012   a. 2007;  b. 2012 Gen change: SJM 3231-40 Uni BiV ICD, ser # [id number].  Marland Kitchen CAD (coronary artery disease)    a. 05/2001 CABG x4: LIMA->LAD, VG->D1, VG->D2, VG->RCA.  . Cardiomyopathy, ischemic    a. 2012 s/p SJM 3231-40 Uni BiV ICD, ser # [id number].  . Carotid stenosis    a. 10/19/2011 carotid duplex - Mild hard plaque bilaterally. Stable 40-59% bilateral ICA stenosis. Carotid US (04/2013):  Bilateral 40-59% ICA.  F/u 1 year  . Cerumen impaction 10/28/2015  . Chronic combined systolic and diastolic CHF (congestive heart failure) (HCC)    a. EF as low as 25% in 2006;  b. EF 60-65% in 12/2011;  c. 02/2013 Echo: EF 35-40, mod mid-dist antsept HK, Gr 2 DD, mild LVH.  . CKD (chronic kidney disease), stage III   . COPD (chronic obstructive pulmonary disease) ([place])   . COPD, severe ([place]) 01/22/2016  . 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 Dyslipidemia   . Gout ~ 08/2011  . Hypercalcemia 09/26/2013   . Hypertension   . Hypothyroidism   . Hypoxemia requiring supplemental oxygen   . LBBB (left bundle branch block)   . Morbid obesity with BMI of 45.0-49.9, adult (HCC)    Ht. 5'4". BMI 47.2  . Mural thrombus of left ventricle    before 2003, while on Coumadin, No h/o CVA  . 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 Benay Spice, in remission  . On home oxygen therapy    "normally 2-3L; 24/7" (12/14/2015)  . OSA on CPAP   . PAF (paroxysmal atrial fibrillation) (HCC)    on Coumadin  . Presence of permanent cardiac pacemaker   . Retinopathy due to secondary diabetes ([place])    type II, uncontrolled  . Vitamin D deficiency 06/09/2012   historical    Past Surgical History:  Procedure Laterality Date  . ABDOMINAL HYSTERECTOMY  1982  . CARDIAC CATHETERIZATION  06/03/01  . CARDIOVERSION N/A 07/30/2014   Procedure: CARDIOVERSION;  Surgeon: Jolaine Artist, MD;  Location[facility] ENDOSCOPY;  Service: Cardiovascular;  Laterality: N/A;  . CARDIOVERSION N/A 11/25/2014   Procedure: CARDIOVERSION;  Surgeon: Evans Lance, MD;  Location: [place];  Service: Cardiovascular;  Laterality: N/A;  . CATARACT EXTRACTION     left eye  . CHOLECYSTECTOMY  1970  . CORONARY ARTERY BYPASS GRAFT  2003   CABG X4  .  INSERT / REPLACE / REMOVE PACEMAKER  2007; 2012   w/AICD  . REFRACTIVE SURGERY     right eye  . TEE WITHOUT CARDIOVERSION N/A 12/16/2015   Procedure: TRANSESOPHAGEAL ECHOCARDIOGRAM (TEE);  Surgeon: Jolaine Artist, MD;  Location: [facility] ENDOSCOPY;  Service: Cardiovascular;  Laterality: N/A;  . TUBAL LIGATION  1972    Family History  Problem Relation Age of Onset  . Kidney cancer Mother     kidney and female repo - died @ 108  . Asthma Mother   . Cancer Mother     gyn and renal  . Heart disease Mother     CAD  . Heart disease Father     died @ 39  . Stroke Father   . Diabetes Father   . Hypertension Father   . Hyperlipidemia Father   . Heart attack  Father   . Hypertension Son   . Kidney cancer Maternal Uncle   . Cancer Maternal Uncle   . Cirrhosis Maternal Grandmother     non alcohol  . Cancer Maternal Grandfather   . Kidney cancer Maternal Grandfather   . Hypertension Maternal Aunt     Social History   Social History  . Marital status: Married    Spouse name: N/A  . Number of children: Y  . Years of education: N/A   Occupational History  . 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    Social History Main Topics  . Smoking status: Never Smoker  . Smokeless tobacco: Never Used  . Alcohol use No  . Drug use: No  . Sexual activity: Not Currently     Comment: lives with husband, no major dietary restrictions   Other Topics Concern  . Not on file   Social History Narrative   Lives in [place] with her husband.  She does not routinely exercise or adhere to any particular diet.      Outpatient Medications Prior to Visit  Medication Sig Dispense Refill  . acetaminophen (TYLENOL) 500 MG tablet Take 1,000 mg by mouth 2 (two) times daily as needed for mild pain.     Marland Kitchen albuterol (PROVENTIL HFA;VENTOLIN HFA) 108 (90 BASE) MCG/ACT inhaler Inhale 2 puffs into the lungs every 6 (six) hours as needed for wheezing or shortness of breath. 1 Inhaler 1  . amiodarone (PACERONE) 200 MG tablet Take 200 mg by mouth daily.    Marland Kitchen atorvastatin (LIPITOR) 20 MG tablet TAKE 1 TABLET EVERY DAY (Patient taking differently: Take 20 mg by mouth daily) 90 tablet 1  . carvedilol (COREG) 25 MG tablet TAKE 2 TABLETS TWICE DAILY (Patient taking differently: Take 50 mg by mouth twice daily) 360 tablet 3  . escitalopram (LEXAPRO) 10 MG tablet TAKE 1 TABLET EVERY DAY (Patient taking differently: Take 10 mg by mouth daily) 90 tablet 1  . fenofibrate (TRICOR) 48 MG tablet TAKE 1 TABLET EVERY DAY (Patient taking differently: Take 48 mg by mouth daily) 90 tablet 3  . fluticasone (FLONASE) 50 MCG/ACT nasal spray Place 2 sprays into both nostrils daily as  needed for allergies or rhinitis. As needed for nasal stuffiness. 16 g 0  . glucose blood (FREESTYLE LITE) test strip DX: 250.60  Check sugars bid and as needed (Patient taking differently: 1 each by Other route See admin instructions. Check blood sugar twice daily) 100 each 2  . insulin NPH (HUMULIN N,NOVOLIN N) 100 UNIT/ML injection Inject 15-30 Units into the skin 2 (two) times daily before a  meal. Take 20-30 units in the morning and 10-15 units in the evening    . insulin regular (NOVOLIN R,HUMULIN R) 100 units/mL injection Change to 15 units three times a with meals (to cover all meals--this is the same total amount but spread out) (Patient taking differently: Inject 10-15 Units into the skin 2 (two) times daily before a meal. Change to 15 units three times a with meals (to cover all meals--this is the same total amount but spread out)) 10 mL 12  . levothyroxine (SYNTHROID, LEVOTHROID) 25 MCG tablet TAKE 1 TABLET EVERY DAY BEFORE BREAKFAST (Patient taking differently: Take 25 mcg by mouth daily) 90 tablet 1  . LORazepam (ATIVAN) 0.5 MG tablet Take 1 tablet (0.5 mg total) by mouth at bedtime. 90 tablet 1  . OXYGEN Inhale 3 L into the lungs continuous.    . torsemide (DEMADEX) 20 MG tablet Take 2 tablets (40 mg total) by mouth daily. 30 tablet 2  . Vitamin D, Ergocalciferol, (DRISDOL) 50000 UNITS CAPS capsule Take 50,000 Units by mouth every Tuesday.     . warfarin (COUMADIN) 5 MG tablet TAKE AS DIRECTED BY ANTICOAGULATION CLINIC. (Patient taking differently: TAKES 1 TAB (5 mg) ON SUN AND THURS, TAKES 1/2 TAB (2.5 mg) ALL OTHER DAYS) 70 tablet 1  . gabapentin (NEURONTIN) 300 MG capsule Take 300 mg by mouth at bedtime.    Marland Kitchen rOPINIRole (REQUIP) 0.25 MG tablet Take 1 tablet (0.25 mg total) by mouth at bedtime. 30 tablet 1   No facility-administered medications prior to visit.     Allergies  Allergen Reactions  . Sulfonamide Derivatives Other (See Comments)    Unknown; "childhood allergy/mother"      Review of Systems  Constitutional: Positive for malaise/fatigue. Negative for fever.  HENT: Negative for congestion.   Eyes: Negative for blurred vision.  Respiratory: Positive for shortness of breath.   Cardiovascular: Negative for chest pain, palpitations and leg swelling.  Gastrointestinal: Negative for abdominal pain, blood in stool and nausea.  Genitourinary: Negative for dysuria and frequency.  Musculoskeletal: Positive for back pain and myalgias. Negative for falls.  Skin: Negative for rash.  Neurological: Positive for weakness. Negative for dizziness, loss of consciousness and headaches.  Endo/Heme/Allergies: Negative for environmental allergies.  Psychiatric/Behavioral: Negative for depression. The patient is not nervous/anxious.        Objective:    Physical Exam  Constitutional: She is oriented to person, place, and time. She appears well-developed and well-nourished. No distress.  HENT:  Head: Normocephalic and atraumatic.  Nose: Nose normal.  Eyes: Right eye exhibits no discharge. Left eye exhibits no discharge.  Neck: Normal range of motion. Neck supple.  Cardiovascular: Normal rate and regular rhythm.   Pulmonary/Chest: Effort normal and breath sounds normal.  Abdominal: Soft. Bowel sounds are normal. There is no tenderness.  Musculoskeletal: She exhibits edema.  Neurological: She is alert and oriented to person, place, and time.  Skin: Skin is warm and dry.  Psychiatric: She has a normal mood and affect.  Nursing note and vitals reviewed.   BP 108/68 (BP Location: Left Arm, Patient Position: Sitting, Cuff Size: Large)   Pulse 72   Temp 98.3 F (36.8 C) (Oral)   Ht 5\' 4"  (1.626 m)   Wt 247 lb 8 oz (112.3 kg)   SpO2 94%   BMI 42.48 kg/m  Wt Readings from Last 3 Encounters:  02/16/16 247 lb 8 oz (112.3 kg)  02/14/16 243 lb (110.2 kg)  01/25/16 248 lb (112.5 kg)  Lab Results  Component Value Date   WBC 5.3 01/08/2016   HGB 9.0 (L) 01/08/2016    HCT 31.0 (L) 01/08/2016   PLT 172 01/08/2016   GLUCOSE 91 01/09/2016   CHOL 148 07/14/2015   TRIG 195.0 (H) 07/14/2015   HDL 40.30 07/14/2015   LDLDIRECT 64.8 05/11/2014   LDLCALC 68 07/14/2015   ALT 28 01/05/2016   AST 38 01/05/2016   NA 138 01/09/2016   K 4.2 01/09/2016   CL 94 (L) 01/09/2016   CREATININE 2.33 (H) 01/09/2016   BUN 61 (H) 01/09/2016   CO2 35 (H) 01/09/2016   TSH 4.270 12/15/2015   INR 1.9 02/17/2016   HGBA1C 6.6 (H) 12/14/2015   MICROALBUR 77.0 (H) 03/28/2011    Lab Results  Component Value Date   TSH 4.270 12/15/2015   Lab Results  Component Value Date   WBC 5.3 01/08/2016   HGB 9.0 (L) 01/08/2016   HCT 31.0 (L) 01/08/2016   MCV 101.0 (H) 01/08/2016   PLT 172 01/08/2016   Lab Results  Component Value Date   NA 138 01/09/2016   K 4.2 01/09/2016   CO2 35 (H) 01/09/2016   GLUCOSE 91 01/09/2016   BUN 61 (H) 01/09/2016   CREATININE 2.33 (H) 01/09/2016   BILITOT 1.0 01/05/2016   ALKPHOS 38 01/05/2016   AST 38 01/05/2016   ALT 28 01/05/2016   PROT 5.9 (L) 01/05/2016   ALBUMIN 3.0 (L) 01/05/2016   CALCIUM 8.9 01/09/2016   ANIONGAP 9 01/09/2016   GFR 18.77 (L) 07/14/2015   Lab Results  Component Value Date   CHOL 148 07/14/2015   Lab Results  Component Value Date   HDL 40.30 07/14/2015   Lab Results  Component Value Date   LDLCALC 68 07/14/2015   Lab Results  Component Value Date   TRIG 195.0 (H) 07/14/2015   Lab Results  Component Value Date   CHOLHDL 4 07/14/2015   Lab Results  Component Value Date   HGBA1C 6.6 (H) 12/14/2015       Assessment & Plan:   Problem List Items Addressed This Visit    Backache (Chronic)    Struggling with sleep and getting comfortable. Encouraged to use Tylenol and try Lidocaine patches.       Diabetes mellitus type 2 with retinopathy (HCC)    minimize simple carbs. Increase exercise as tolerated. Continue current meds, follows with Dr Chalmers Cater of endocrinology      Hyperlipidemia     Tolerating statin, encouraged heart healthy diet, avoid trans fats, minimize simple carbs and saturated fats. Increase exercise as tolerated      Essential hypertension    Well controlled, no changes to meds. Encouraged heart healthy diet such as the DASH diet and exercise as tolerated.       Acute on chronic respiratory failure with hypercapnia (HCC)    Is doing better on her positive pressure noninvasive ventilator at home. She is not having any more trouble with the strap. Filled out a form for [employer] to keep her power on during outages.       Skin lesion of back - Primary    Irritated lesion on back referred to dermatology for further consideration      Relevant Orders   Ambulatory referral to Dermatology    Other Visit Diagnoses   None.     I have discontinued Ms. Meisenheimer's rOPINIRole. I have also changed her gabapentin. Additionally, I am having her start on rOPINIRole. Lastly, I am  having her maintain her albuterol, insulin NPH Human, insulin regular, glucose blood, Vitamin D (Ergocalciferol), fluticasone, acetaminophen, warfarin, carvedilol, LORazepam, atorvastatin, fenofibrate, escitalopram, levothyroxine, OXYGEN, torsemide, and amiodarone.  Meds ordered this encounter  Medications  . gabapentin (NEURONTIN) 300 MG capsule    Sig: Take 2 capsules (600 mg total) by mouth at bedtime.  Marland Kitchen rOPINIRole (REQUIP) 0.5 MG tablet    Sig: Take 1-2 tablets (0.5-1 mg total) by mouth at bedtime.    Dispense:  60 tablet    Refill:  1     Penni Homans, MD

## 2016-02-18 NOTE — Assessment & Plan Note (Addendum)
Is doing better on her positive pressure noninvasive ventilator at home. She is not having any more trouble with the strap. Filled out a form for [employer] to keep her power on during outages.

## 2016-02-18 NOTE — Assessment & Plan Note (Signed)
Well controlled, no changes to meds. Encouraged heart healthy diet such as the DASH diet and exercise as tolerated.  °

## 2016-02-18 NOTE — Assessment & Plan Note (Signed)
Struggling with sleep and getting comfortable. Encouraged to use Tylenol and try Lidocaine patches.

## 2016-02-18 NOTE — Assessment & Plan Note (Signed)
Irritated lesion on back referred to dermatology for further consideration

## 2016-02-21 ENCOUNTER — Ambulatory Visit (HOSPITAL_COMMUNITY)
Admission: RE | Admit: 2016-02-21 | Discharge: 2016-02-21 | Disposition: A | Discharge status: Home / Self Care | Payer: Medicare Other | Source: Ambulatory Visit | Attending: Nephrology | Admitting: Nephrology

## 2016-02-21 DIAGNOSIS — D509 Iron deficiency anemia, unspecified: Secondary | ICD-10-CM | POA: Insufficient documentation

## 2016-02-21 MED ORDER — SODIUM CHLORIDE 0.9 % IV SOLN
510.0000 mg | Freq: Once | INTRAVENOUS | Status: AC
  Administered 2016-02-21: 510 mg via INTRAVENOUS
  Filled 2016-02-21: qty 17

## 2016-02-29 ENCOUNTER — Ambulatory Visit (INDEPENDENT_AMBULATORY_CARE_PROVIDER_SITE_OTHER): Payer: Medicare Other | Admitting: Cardiology

## 2016-02-29 DIAGNOSIS — I130 Hypertensive heart and chronic kidney disease with heart failure and stage 1 through stage 4 chronic kidney disease, or unspecified chronic kidney disease: Secondary | ICD-10-CM | POA: Diagnosis not present

## 2016-02-29 DIAGNOSIS — N183 Chronic kidney disease, stage 3 (moderate): Secondary | ICD-10-CM | POA: Diagnosis not present

## 2016-02-29 DIAGNOSIS — E1122 Type 2 diabetes mellitus with diabetic chronic kidney disease: Secondary | ICD-10-CM | POA: Diagnosis not present

## 2016-02-29 DIAGNOSIS — G4733 Obstructive sleep apnea (adult) (pediatric): Secondary | ICD-10-CM | POA: Diagnosis not present

## 2016-02-29 DIAGNOSIS — I5042 Chronic combined systolic (congestive) and diastolic (congestive) heart failure: Secondary | ICD-10-CM | POA: Diagnosis not present

## 2016-02-29 DIAGNOSIS — I480 Paroxysmal atrial fibrillation: Secondary | ICD-10-CM

## 2016-02-29 DIAGNOSIS — E662 Morbid (severe) obesity with alveolar hypoventilation: Secondary | ICD-10-CM | POA: Diagnosis not present

## 2016-02-29 DIAGNOSIS — Z5181 Encounter for therapeutic drug level monitoring: Secondary | ICD-10-CM

## 2016-02-29 LAB — POCT INR: INR: 2.1

## 2016-03-01 ENCOUNTER — Telehealth: Payer: Self-pay | Admitting: *Deleted

## 2016-03-01 DIAGNOSIS — Z794 Long term (current) use of insulin: Secondary | ICD-10-CM

## 2016-03-01 DIAGNOSIS — N184 Chronic kidney disease, stage 4 (severe): Secondary | ICD-10-CM

## 2016-03-01 DIAGNOSIS — E1140 Type 2 diabetes mellitus with diabetic neuropathy, unspecified: Secondary | ICD-10-CM | POA: Insufficient documentation

## 2016-03-01 NOTE — Telephone Encounter (Signed)
Problem list updated as directed. In reviewing pt's last labs on 01/09/16, her GFR is 20. Would CKD Stage IV, GFR 15-29 ml/min (N18.4) be a more accurate diagnosis?   Lab Results  Component Value Date   CREATININE 2.33 (H) 01/09/2016   BUN 61 (H) 01/09/2016   NA 138 01/09/2016   K 4.2 01/09/2016   CL 94 (L) 01/09/2016   CO2 35 (H) 01/09/2016

## 2016-03-01 NOTE — Telephone Encounter (Signed)
-----   Message from Mosie Lukes, MD sent at 02/29/2016 10:10 PM EDT ----- Regarding: RE: [employer] Opportunity Yes to all questions. Can you do that or do I need to do it? Thanks Dr B ----- Message ----- From: Dorrene German, RN Sent: 02/28/2016   2:12 PM To: Mosie Lukes, MD, Gerilyn Nestle, RN Subject: RAF Opportunity                                Per chart review, pt has insulin dependent diabetes w/ CKD III, neuropathy, and retinopathy based on office visit notes and medications.   Can current diagnosis of Controlled type 2 diabetes mellitus with chronic kidney disease (HCC)(E11.22) and Diabetes mellitus type 2 with retinopathy ([place]) (E11.319) be updated/added to problem list as:  DM2 w/ diabetic chronic kidney disease =E11.22 Stage 3= N18.3 DM2 w/diabetic neuropathy, unspecified=E11.40 Z79.4-long term (current) use of insulin?   Can current diagnosis of CVA and NHL be updated to problem list as H/O CVA and H/O NHL?  Can current diagnosis of recurrent UTI be removed from problem list? Last urine culture was in 2014 and was negative.   Thanks, Hoyle Sauer

## 2016-03-01 NOTE — Telephone Encounter (Signed)
Problem List updated w/ CKD IV.

## 2016-03-01 NOTE — Telephone Encounter (Signed)
yes

## 2016-03-06 DIAGNOSIS — L820 Inflamed seborrheic keratosis: Secondary | ICD-10-CM | POA: Diagnosis not present

## 2016-03-07 DIAGNOSIS — L821 Other seborrheic keratosis: Secondary | ICD-10-CM | POA: Diagnosis not present

## 2016-03-15 ENCOUNTER — Telehealth: Payer: Self-pay | Admitting: Cardiology

## 2016-03-15 NOTE — Telephone Encounter (Signed)
Spoke w/ pt and requested that she send a manual transmission b/c her home monitor has not updated in at least 8 days.   

## 2016-03-19 ENCOUNTER — Ambulatory Visit (INDEPENDENT_AMBULATORY_CARE_PROVIDER_SITE_OTHER): Payer: Medicare Other | Admitting: Physician Assistant

## 2016-03-19 ENCOUNTER — Encounter: Payer: Self-pay | Admitting: Physician Assistant

## 2016-03-19 ENCOUNTER — Ambulatory Visit (HOSPITAL_BASED_OUTPATIENT_CLINIC_OR_DEPARTMENT_OTHER)
Admission: RE | Admit: 2016-03-19 | Discharge: 2016-03-19 | Disposition: A | Discharge status: Home / Self Care | Payer: Medicare Other | Source: Ambulatory Visit | Attending: Physician Assistant | Admitting: Physician Assistant

## 2016-03-19 VITALS — BP 102/58 | HR 79 | Temp 98.3°F | Resp 16 | Ht 64.0 in | Wt 247.2 lb

## 2016-03-19 DIAGNOSIS — M25559 Pain in unspecified hip: Secondary | ICD-10-CM | POA: Insufficient documentation

## 2016-03-19 DIAGNOSIS — G8929 Other chronic pain: Secondary | ICD-10-CM | POA: Insufficient documentation

## 2016-03-19 DIAGNOSIS — M479 Spondylosis, unspecified: Secondary | ICD-10-CM | POA: Diagnosis not present

## 2016-03-19 DIAGNOSIS — M160 Bilateral primary osteoarthritis of hip: Secondary | ICD-10-CM | POA: Insufficient documentation

## 2016-03-19 DIAGNOSIS — I6350 Cerebral infarction due to unspecified occlusion or stenosis of unspecified cerebral artery: Secondary | ICD-10-CM | POA: Diagnosis not present

## 2016-03-19 NOTE — Progress Notes (Signed)
Pre visit review using our clinic review tool, if applicable. No additional management support is needed unless otherwise documented below in the visit note/SLS  

## 2016-03-19 NOTE — Patient Instructions (Signed)
Please go downstairs for imaging. I will call with your results.  Please continue chronic medications as directed. If you are feeling better off of the Requip, we will continue to stay off of it.  If all looks good on imaging I would recommend some home physical therapy.  If symptoms recur, please call or come see [place].

## 2016-03-19 NOTE — Progress Notes (Signed)
Patient presents to clinic today c/o chronic hip pain with exacerbation occurring 2-3 weeks ago. Endorses at the time she was having intermittent aching pain in hips and thighs bilaterally. Denies radiation into lower extremities. Denies trauma or injury. Is mainly wheelchair bound. Denies numbness, tingling or weakness of extremity. Patient endorses taking Requip for RLS. Notes she did not note any improvement with the medication. PCP increased her Gabapentin with noted improvement in RLS symptoms. Patient states she stopped Requip and noted improvement in arthralgias of hips and legs bilaterally. Has history of non-hodgkin lymphoma diagnosed in 2010. Wants imaging of her hips to assess for arthritis as a cause of her symptoms.  Past Medical History:  Diagnosis Date  . AICD (automatic cardioverter/defibrillator) present   . Anemia, unspecified 12/13/2013  . Back pain, chronic    "just when I walk; mass on 3rd and 4th vertebrae right lower back"  . Biventricular implantable cardiac defibrillator in situ 2007, 2012   a. 2007;  b. 2012 Gen change: SJM 3231-40 Uni BiV ICD, ser # [id number].  Marland Kitchen CAD (coronary artery disease)    a. 05/2001 CABG x4: LIMA->LAD, VG->D1, VG->D2, VG->RCA.  . Cardiomyopathy, ischemic    a. 2012 s/p SJM 3231-40 Uni BiV ICD, ser # [id number].  . Carotid stenosis    a. 10/19/2011 carotid duplex - Mild hard plaque bilaterally. Stable 40-59% bilateral ICA stenosis. Carotid US (04/2013):  Bilateral 40-59% ICA.  F/u 1 year  . Cerumen impaction 10/28/2015  . Chronic combined systolic and diastolic CHF (congestive heart failure) (HCC)    a. EF as low as 25% in 2006;  b. EF 60-65% in 12/2011;  c. 02/2013 Echo: EF 35-40, mod mid-dist antsept HK, Gr 2 DD, mild LVH.  . CKD (chronic kidney disease), stage III   . COPD (chronic obstructive pulmonary disease) ([place])   . 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 Dyslipidemia   . Gout ~ 08/2011  . Hypercalcemia 09/26/2013  . Hypertension   . Hypothyroidism   . Hypoxemia requiring supplemental oxygen   . LBBB (left bundle branch block)   . Morbid obesity with BMI of 45.0-49.9, adult (HCC)    Ht. 5'4". BMI 47.2  . Mural thrombus of left ventricle    before 2003, while on Coumadin, No h/o CVA  . 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 Benay Spice, in remission  . On home oxygen therapy    "normally 2-3L; 24/7" (12/14/2015)  . OSA on CPAP   . PAF (paroxysmal atrial fibrillation) (HCC)    on Coumadin  . Presence of permanent cardiac pacemaker   . 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 Vitamin D deficiency 06/09/2012   historical    Current Outpatient Prescriptions on File Prior to Visit  Medication Sig Dispense Refill  . insulin NPH (HUMULIN N,NOVOLIN N) 100 UNIT/ML injection Inject 15-30 Units into the skin 2 (two) times daily before a meal. Take 20-30 units in the morning and 10-15 units in the evening    . insulin regular (NOVOLIN R,HUMULIN R) 100 units/mL injection Change to 15 units three times a with meals (to cover all meals--this is the same total amount but spread out) (Patient taking differently: Inject 10-15 Units into the skin 2 (two) times daily before a meal. Change to 15 units three times a with meals (  to cover all meals--this is the same total amount but spread out)) 10 mL 12  . levothyroxine (SYNTHROID, LEVOTHROID) 25 MCG tablet TAKE 1 TABLET EVERY DAY BEFORE BREAKFAST (Patient taking differently: Take 25 mcg by mouth daily) 90 tablet 1  . LORazepam (ATIVAN) 0.5 MG tablet Take 1 tablet (0.5 mg total) by mouth at bedtime. 90 tablet 1  . OXYGEN Inhale 3 L into the lungs continuous.    . torsemide (DEMADEX) 20 MG tablet Take 2 tablets (40 mg total) by mouth daily. 30 tablet 2  . Vitamin D, Ergocalciferol, (DRISDOL) 50000 UNITS CAPS capsule Take 50,000  Units by mouth every Tuesday.     . warfarin (COUMADIN) 5 MG tablet TAKE AS DIRECTED BY ANTICOAGULATION CLINIC. (Patient taking differently: TAKES 1 TAB (5 mg) ON SUN AND THURS, TAKES 1/2 TAB (2.5 mg) ALL OTHER DAYS) 70 tablet 1  . acetaminophen (TYLENOL) 500 MG tablet Take 1,000 mg by mouth 2 (two) times daily as needed for mild pain.     Marland Kitchen albuterol (PROVENTIL HFA;VENTOLIN HFA) 108 (90 BASE) MCG/ACT inhaler Inhale 2 puffs into the lungs every 6 (six) hours as needed for wheezing or shortness of breath. 1 Inhaler 1  . amiodarone (PACERONE) 200 MG tablet Take 200 mg by mouth daily.    Marland Kitchen atorvastatin (LIPITOR) 20 MG tablet TAKE 1 TABLET EVERY DAY (Patient taking differently: Take 20 mg by mouth daily) 90 tablet 1  . carvedilol (COREG) 25 MG tablet TAKE 2 TABLETS TWICE DAILY (Patient taking differently: Take 50 mg by mouth twice daily) 360 tablet 3  . escitalopram (LEXAPRO) 10 MG tablet TAKE 1 TABLET EVERY DAY (Patient taking differently: Take 10 mg by mouth daily) 90 tablet 1  . fenofibrate (TRICOR) 48 MG tablet TAKE 1 TABLET EVERY DAY (Patient taking differently: Take 48 mg by mouth daily) 90 tablet 3  . fluticasone (FLONASE) 50 MCG/ACT nasal spray Place 2 sprays into both nostrils daily as needed for allergies or rhinitis. As needed for nasal stuffiness. 16 g 0  . gabapentin (NEURONTIN) 300 MG capsule Take 2 capsules (600 mg total) by mouth at bedtime. (Patient taking differently: Take 300 mg by mouth 2 (two) times daily. )    . glucose blood (FREESTYLE LITE) test strip DX: 250.60  Check sugars bid and as needed (Patient taking differently: 1 each by Other route See admin instructions. Check blood sugar twice daily) 100 each 2  . rOPINIRole (REQUIP) 0.5 MG tablet Take 1-2 tablets (0.5-1 mg total) by mouth at bedtime. (Patient not taking: Reported on 03/19/2016) 60 tablet 1   No current facility-administered medications on file prior to visit.     Allergies  Allergen Reactions  . Sulfonamide  Derivatives Other (See Comments)    Unknown; "childhood allergy/mother"    Family History  Problem Relation Age of Onset  . Kidney cancer Mother     kidney and female repo - died @ 42  . Asthma Mother   . Cancer Mother     gyn and renal  . Heart disease Mother     CAD  . Heart disease Father     died @ 69  . Stroke Father   . Diabetes Father   . Hypertension Father   . Hyperlipidemia Father   . Heart attack Father   . Hypertension Son   . Kidney cancer Maternal Uncle   . Cancer Maternal Uncle   . Cirrhosis Maternal Grandmother     non alcohol  . Cancer Maternal Grandfather   .  Kidney cancer Maternal Grandfather   . Hypertension Maternal Aunt     Social History   Social History  . Marital status: Married    Spouse name: N/A  . Number of children: Y  . Years of education: N/A   Occupational History  . 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    Social History Main Topics  . Smoking status: Never Smoker  . Smokeless tobacco: Never Used  . Alcohol use No  . Drug use: No  . Sexual activity: Not Currently     Comment: lives with husband, no major dietary restrictions   Other Topics Concern  . None   Social History Narrative   Lives in [place] with her husband.  She does not routinely exercise or adhere to any particular diet.      Review of Systems - See HPI.  All other ROS are negative.  BP (!) 102/58 (BP Location: Right Arm, Patient Position: Sitting, Cuff Size: Large)   Pulse 79   Temp 98.3 F (36.8 C) (Oral)   Resp 16   Ht _0  (1.626 m)   Wt 247 lb 4 oz (112.2 kg)   SpO2 95% Comment: 3ltr/pm  BMI 42.44 kg/m   Physical Exam  Constitutional: She is oriented to person, place, and time and well-developed, well-nourished, and in no distress.  HENT:  Head: Normocephalic and atraumatic.  Eyes: Conjunctivae are normal.  Cardiovascular: Normal rate, regular rhythm, normal heart sounds and intact distal pulses.   Pulmonary/Chest: Effort normal  and breath sounds normal. No respiratory distress. She has no wheezes. She has no rales. She exhibits no tenderness.  Musculoskeletal:       Right hip: She exhibits tenderness. She exhibits normal strength and no bony tenderness.       Left hip: She exhibits tenderness. She exhibits normal strength and no bony tenderness.       Lumbar back: She exhibits normal range of motion, no tenderness and no pain.  Neurological: She is alert and oriented to person, place, and time.  Skin: Skin is warm and dry. No rash noted.  Psychiatric: Affect normal.  Vitals reviewed.  Recent Results (from the past 2160 hour(s))  POCT INR     Status: None   Collection Time: 12/28/15 10:42 AM  Result Value Ref Range   INR 3.2   Implantable device check     Status: None   Collection Time: 12/28/15  2:55 PM  Result Value Ref Range   Date Time Interrogation Session [id number]    Pulse Generator Manufacturer [employer]    Pulse Gen Model 3231-40 Unify    Pulse Gen Serial Number [id number]    Implantable Pulse Generator Type Cardiac Resynch Therapy Defibulator    Implantable Pulse Generator Implant Date [id number]    Implantable Lead Manufacturer [facility]    Implantable Lead Model 1058T    Implantable Lead Serial Number [id number]    Implantable Lead Implant Date [id number]    Implantable Lead Location [id number]    Implantable Lead Manufacturer [facility]    Implantable Lead Model 1788TC    Implantable Lead Serial Number [id number]    Implantable Lead Implant Date [id number]    Implantable Lead Location [id number]    Implantable Lead Manufacturer [facility]    Implantable Lead Model 7001    Implantable Lead Serial Number [id number]    Implantable Lead Implant Date [id number]    Implantable Lead Location [id number]    Lead Channel Setting Sensing Sensitivity 0.5 mV  Lead Channel Setting Pacing Amplitude 1.875    Lead Channel Setting Pacing Pulse Width 0.5 ms   Lead Channel Setting Pacing Amplitude 2.5 V   Lead Channel Setting Pacing Pulse  Width 0.6 ms   Lead Channel Setting Pacing Amplitude 2.125    Lead Channel Impedance Value 287.5 ohm   Lead Channel Sensing Intrinsic Amplitude 3.6 mV   Lead Channel Pacing Threshold Amplitude 0.75 V   Lead Channel Pacing Threshold Pulse Width 0.5 ms   Lead Channel Impedance Value 350.0 ohm   Lead Channel Sensing Intrinsic Amplitude 11.7 mV   Lead Channel Pacing Threshold Amplitude 0.75 V   Lead Channel Pacing Threshold Pulse Width 0.5 ms   HighPow Impedance [id number]    Lead Channel Impedance Value 262.5 ohm   Lead Channel Pacing Threshold Amplitude 1.25 V   Lead Channel Pacing Threshold Pulse Width 0.6 ms   Battery Remaining Longevity 15.6    Brady Statistic RA Percent Paced 99.84 %   Brady Statistic RV Percent Paced 99.91 %   Eval Rhythm AS,VS @ 36   Brain natriuretic peptide     Status: Abnormal   Collection Time: 01/01/16 11:10 AM  Result Value Ref Range   B Natriuretic Peptide 829.2 (H) 0.0 - 100.0 pg/mL  CBC with Differential     Status: Abnormal   Collection Time: 01/01/16 11:10 AM  Result Value Ref Range   WBC 6.9 4.0 - 10.5 K/uL   RBC 3.14 (L) 3.87 - 5.11 MIL/uL   Hemoglobin 9.4 (L) 12.0 - 15.0 g/dL   HCT 32.0 (L) 36.0 - 46.0 %   MCV 101.9 (H) 78.0 - 100.0 fL   MCH 29.9 26.0 - 34.0 pg   MCHC 29.4 (L) 30.0 - 36.0 g/dL   RDW 16.5 (H) 11.5 - 15.5 %   Platelets 162 150 - 400 K/uL   Neutrophils Relative % 64 %   Neutro Abs 4.4 1.7 - 7.7 K/uL   Lymphocytes Relative 17 %   Lymphs Abs 1.2 0.7 - 4.0 K/uL   Monocytes Relative 16 %   Monocytes Absolute 1.1 (H) 0.1 - 1.0 K/uL   Eosinophils Relative 3 %   Eosinophils Absolute 0.2 0.0 - 0.7 K/uL   Basophils Relative 0 %   Basophils Absolute 0.0 0.0 - 0.1 K/uL  Comprehensive metabolic panel     Status: Abnormal   Collection Time: 01/01/16 11:10 AM  Result Value Ref Range   Sodium 137 135 - 145 mmol/L   Potassium 4.6 3.5 - 5.1 mmol/L   Chloride 94 (L) 101 - 111 mmol/L   CO2 34 (H) 22 - 32 mmol/L   Glucose, Bld 128  (H) 65 - 99 mg/dL   BUN 56 (H) 6 - 20 mg/dL   Creatinine, Ser 2.91 (H) 0.44 - 1.00 mg/dL   Calcium 8.9 8.9 - 10.3 mg/dL   Total Protein 6.3 (L) 6.5 - 8.1 g/dL   Albumin 3.4 (L) 3.5 - 5.0 g/dL   AST 29 15 - 41 U/L   ALT 25 14 - 54 U/L   Alkaline Phosphatase 37 (L) 38 - 126 U/L   Total Bilirubin 0.8 0.3 - 1.2 mg/dL   GFR calc non Af Amer 15 (L) >60 mL/min   GFR calc Af Amer 17 (L) >60 mL/min    Comment: (NOTE) The eGFR has been calculated using the CKD EPI equation. This calculation has not been validated in all clinical situations. eGFR's persistently <60 mL/min signify possible Chronic Kidney Disease.  Anion gap 9 5 - 15  Protime-INR     Status: Abnormal   Collection Time: 01/01/16 11:10 AM  Result Value Ref Range   Prothrombin Time 32.3 (H) 11.4 - 15.2 seconds   INR 3.06   I-Stat Troponin, ED (not at [facility])     Status: None   Collection Time: 01/01/16 11:12 AM  Result Value Ref Range   Troponin i, poc 0.03 0.00 - 0.08 ng/mL   Comment 3            Comment: Due to the release kinetics of cTnI, a negative result within the first hours of the onset of symptoms does not rule out myocardial infarction with certainty. If myocardial infarction is still suspected, repeat the test at appropriate intervals.   I-Stat arterial blood gas, ED     Status: Abnormal   Collection Time: 01/01/16 11:28 AM  Result Value Ref Range   pH, Arterial 7.326 (L) 7.350 - 7.450   pCO2 arterial 73.7 (HH) 35.0 - 45.0 mmHg   pO2, Arterial 57.0 (L) 80.0 - 100.0 mmHg   Bicarbonate 38.5 (H) 20.0 - 24.0 mEq/L   TCO2 41 0 - 100 mmol/L   O2 Saturation 85.0 %   Acid-Base Excess 10.0 (H) 0.0 - 2.0 mmol/L   Patient temperature 98.6 F    Collection site RADIAL, ALLEN'S TEST ACCEPTABLE    Drawn by RT    Sample type ARTERIAL    Comment NOTIFIED PHYSICIAN   I-Stat arterial blood gas, ED     Status: Abnormal   Collection Time: 01/01/16  1:56 PM  Result Value Ref Range   pH, Arterial 7.329 (L) 7.350 - 7.450    pCO2 arterial 76.4 (HH) 35.0 - 45.0 mmHg   pO2, Arterial 131.0 (H) 80.0 - 100.0 mmHg   Bicarbonate 40.3 (H) 20.0 - 24.0 mEq/L   TCO2 43 0 - 100 mmol/L   O2 Saturation 99.0 %   Acid-Base Excess 12.0 (H) 0.0 - 2.0 mmol/L   Patient temperature 98.3 F    Collection site RADIAL, ALLEN'S TEST ACCEPTABLE    Drawn by RT    Sample type ARTERIAL    Comment NOTIFIED PHYSICIAN   Glucose, capillary     Status: None   Collection Time: 01/01/16  3:58 PM  Result Value Ref Range   Glucose-Capillary 80 65 - 99 mg/dL   Comment 1 Notify RN    Comment 2 Document in Chart   MRSA PCR Screening     Status: None   Collection Time: 01/01/16  4:42 PM  Result Value Ref Range   MRSA by PCR NEGATIVE NEGATIVE    Comment:        The GeneXpert MRSA Assay (FDA approved for NASAL specimens only), is one component of a comprehensive MRSA colonization surveillance program. It is not intended to diagnose MRSA infection nor to guide or monitor treatment for MRSA infections.   Glucose, capillary     Status: Abnormal   Collection Time: 01/01/16  8:02 PM  Result Value Ref Range   Glucose-Capillary 62 (L) 65 - 99 mg/dL   Comment 1 Notify RN   Glucose, capillary     Status: Abnormal   Collection Time: 01/01/16  8:33 PM  Result Value Ref Range   Glucose-Capillary 109 (H) 65 - 99 mg/dL   Comment 1 Document in Chart   Glucose, capillary     Status: None   Collection Time: 01/02/16 12:06 AM  Result Value Ref Range   Glucose-Capillary 67  65 - 99 mg/dL   Comment 1 Notify RN   Glucose, capillary     Status: Abnormal   Collection Time: 01/02/16 12:30 AM  Result Value Ref Range   Glucose-Capillary 155 (H) 65 - 99 mg/dL   Comment 1 Document in Chart   Basic metabolic panel     Status: Abnormal   Collection Time: 01/02/16  3:25 AM  Result Value Ref Range   Sodium 140 135 - 145 mmol/L   Potassium 4.7 3.5 - 5.1 mmol/L   Chloride 94 (L) 101 - 111 mmol/L   CO2 36 (H) 22 - 32 mmol/L   Glucose, Bld 78 65 - 99 mg/dL    BUN 55 (H) 6 - 20 mg/dL   Creatinine, Ser 2.83 (H) 0.44 - 1.00 mg/dL   Calcium 9.1 8.9 - 10.3 mg/dL   GFR calc non Af Amer 15 (L) >60 mL/min   GFR calc Af Amer 18 (L) >60 mL/min    Comment: (NOTE) The eGFR has been calculated using the CKD EPI equation. This calculation has not been validated in all clinical situations. eGFR's persistently <60 mL/min signify possible Chronic Kidney Disease.    Anion gap 10 5 - 15  Protime-INR     Status: Abnormal   Collection Time: 01/02/16  3:25 AM  Result Value Ref Range   Prothrombin Time 33.0 (H) 11.4 - 15.2 seconds   INR 3.14   Glucose, capillary     Status: Abnormal   Collection Time: 01/02/16  4:04 AM  Result Value Ref Range   Glucose-Capillary 128 (H) 65 - 99 mg/dL   Comment 1 Document in Chart   Glucose, capillary     Status: None   Collection Time: 01/02/16  7:44 AM  Result Value Ref Range   Glucose-Capillary 92 65 - 99 mg/dL   Comment 1 Document in Chart   Glucose, capillary     Status: None   Collection Time: 01/02/16 12:08 PM  Result Value Ref Range   Glucose-Capillary 84 65 - 99 mg/dL   Comment 1 Document in Chart   Glucose, capillary     Status: Abnormal   Collection Time: 01/02/16  3:27 PM  Result Value Ref Range   Glucose-Capillary 139 (H) 65 - 99 mg/dL   Comment 1 Document in Chart   Glucose, capillary     Status: Abnormal   Collection Time: 01/02/16  7:46 PM  Result Value Ref Range   Glucose-Capillary 181 (H) 65 - 99 mg/dL   Comment 1 Notify RN    Comment 2 Document in Chart   Glucose, capillary     Status: Abnormal   Collection Time: 01/02/16 11:29 PM  Result Value Ref Range   Glucose-Capillary 150 (H) 65 - 99 mg/dL   Comment 1 Notify RN    Comment 2 Document in Chart   Basic metabolic panel     Status: Abnormal   Collection Time: 01/03/16  3:24 AM  Result Value Ref Range   Sodium 139 135 - 145 mmol/L   Potassium 4.3 3.5 - 5.1 mmol/L   Chloride 91 (L) 101 - 111 mmol/L   CO2 40 (H) 22 - 32 mmol/L   Glucose,  Bld 137 (H) 65 - 99 mg/dL   BUN 52 (H) 6 - 20 mg/dL   Creatinine, Ser 2.47 (H) 0.44 - 1.00 mg/dL   Calcium 9.0 8.9 - 10.3 mg/dL   GFR calc non Af Amer 18 (L) >60 mL/min   GFR calc Af Amer 21 (  L) >60 mL/min    Comment: (NOTE) The eGFR has been calculated using the CKD EPI equation. This calculation has not been validated in all clinical situations. eGFR's persistently <60 mL/min signify possible Chronic Kidney Disease.    Anion gap 8 5 - 15  Protime-INR     Status: Abnormal   Collection Time: 01/03/16  3:24 AM  Result Value Ref Range   Prothrombin Time 36.1 (H) 11.4 - 15.2 seconds   INR 3.51   CBC     Status: Abnormal   Collection Time: 01/03/16  3:24 AM  Result Value Ref Range   WBC 8.3 4.0 - 10.5 K/uL   RBC 3.25 (L) 3.87 - 5.11 MIL/uL   Hemoglobin 9.5 (L) 12.0 - 15.0 g/dL   HCT 32.6 (L) 36.0 - 46.0 %   MCV 100.3 (H) 78.0 - 100.0 fL   MCH 29.2 26.0 - 34.0 pg   MCHC 29.1 (L) 30.0 - 36.0 g/dL   RDW 16.3 (H) 11.5 - 15.5 %   Platelets 165 150 - 400 K/uL  Magnesium     Status: None   Collection Time: 01/03/16  3:24 AM  Result Value Ref Range   Magnesium 2.0 1.7 - 2.4 mg/dL  Phosphorus     Status: None   Collection Time: 01/03/16  3:24 AM  Result Value Ref Range   Phosphorus 4.1 2.5 - 4.6 mg/dL  Glucose, capillary     Status: Abnormal   Collection Time: 01/03/16  3:45 AM  Result Value Ref Range   Glucose-Capillary 140 (H) 65 - 99 mg/dL   Comment 1 Repeat Test    Comment 2 Document in Chart   Glucose, capillary     Status: Abnormal   Collection Time: 01/03/16  7:46 AM  Result Value Ref Range   Glucose-Capillary 139 (H) 65 - 99 mg/dL   Comment 1 Document in Chart   Glucose, capillary     Status: Abnormal   Collection Time: 01/03/16 12:15 PM  Result Value Ref Range   Glucose-Capillary 149 (H) 65 - 99 mg/dL   Comment 1 Notify RN    Comment 2 Document in Chart   Glucose, capillary     Status: Abnormal   Collection Time: 01/03/16  3:53 PM  Result Value Ref Range    Glucose-Capillary 208 (H) 65 - 99 mg/dL   Comment 1 Notify RN    Comment 2 Document in Chart   Glucose, capillary     Status: Abnormal   Collection Time: 01/03/16  8:05 PM  Result Value Ref Range   Glucose-Capillary 334 (H) 65 - 99 mg/dL  Glucose, capillary     Status: Abnormal   Collection Time: 01/03/16 11:05 PM  Result Value Ref Range   Glucose-Capillary 193 (H) 65 - 99 mg/dL  Glucose, capillary     Status: Abnormal   Collection Time: 01/04/16  4:02 AM  Result Value Ref Range   Glucose-Capillary 132 (H) 65 - 99 mg/dL  Basic metabolic panel     Status: Abnormal   Collection Time: 01/04/16  5:34 AM  Result Value Ref Range   Sodium 135 135 - 145 mmol/L   Potassium 4.6 3.5 - 5.1 mmol/L   Chloride 89 (L) 101 - 111 mmol/L   CO2 39 (H) 22 - 32 mmol/L   Glucose, Bld 121 (H) 65 - 99 mg/dL   BUN 60 (H) 6 - 20 mg/dL   Creatinine, Ser 2.58 (H) 0.44 - 1.00 mg/dL   Calcium 8.7 (L) 8.9 -  10.3 mg/dL   GFR calc non Af Amer 17 (L) >60 mL/min   GFR calc Af Amer 20 (L) >60 mL/min    Comment: (NOTE) The eGFR has been calculated using the CKD EPI equation. This calculation has not been validated in all clinical situations. eGFR's persistently <60 mL/min signify possible Chronic Kidney Disease.    Anion gap 7 5 - 15  Protime-INR     Status: Abnormal   Collection Time: 01/04/16  5:34 AM  Result Value Ref Range   Prothrombin Time 38.3 (H) 11.4 - 15.2 seconds   INR 3.79   CBC     Status: Abnormal   Collection Time: 01/04/16  5:34 AM  Result Value Ref Range   WBC 6.0 4.0 - 10.5 K/uL   RBC 3.00 (L) 3.87 - 5.11 MIL/uL   Hemoglobin 8.6 (L) 12.0 - 15.0 g/dL   HCT 29.6 (L) 36.0 - 46.0 %   MCV 98.7 78.0 - 100.0 fL   MCH 28.7 26.0 - 34.0 pg   MCHC 29.1 (L) 30.0 - 36.0 g/dL   RDW 16.3 (H) 11.5 - 15.5 %   Platelets 161 150 - 400 K/uL  Glucose, capillary     Status: Abnormal   Collection Time: 01/04/16  5:46 PM  Result Value Ref Range   Glucose-Capillary 203 (H) 65 - 99 mg/dL  Glucose,  capillary     Status: Abnormal   Collection Time: 01/04/16  9:10 PM  Result Value Ref Range   Glucose-Capillary 209 (H) 65 - 99 mg/dL  Protime-INR     Status: Abnormal   Collection Time: 01/05/16  2:13 AM  Result Value Ref Range   Prothrombin Time 29.9 (H) 11.4 - 15.2 seconds   INR 2.78   Comprehensive metabolic panel     Status: Abnormal   Collection Time: 01/05/16  2:13 AM  Result Value Ref Range   Sodium 134 (L) 135 - 145 mmol/L   Potassium 4.6 3.5 - 5.1 mmol/L   Chloride 91 (L) 101 - 111 mmol/L   CO2 36 (H) 22 - 32 mmol/L   Glucose, Bld 127 (H) 65 - 99 mg/dL   BUN 64 (H) 6 - 20 mg/dL   Creatinine, Ser 2.46 (H) 0.44 - 1.00 mg/dL   Calcium 8.5 (L) 8.9 - 10.3 mg/dL   Total Protein 5.9 (L) 6.5 - 8.1 g/dL   Albumin 3.0 (L) 3.5 - 5.0 g/dL   AST 38 15 - 41 U/L   ALT 28 14 - 54 U/L   Alkaline Phosphatase 38 38 - 126 U/L   Total Bilirubin 1.0 0.3 - 1.2 mg/dL   GFR calc non Af Amer 18 (L) >60 mL/min   GFR calc Af Amer 21 (L) >60 mL/min    Comment: (NOTE) The eGFR has been calculated using the CKD EPI equation. This calculation has not been validated in all clinical situations. eGFR's persistently <60 mL/min signify possible Chronic Kidney Disease.    Anion gap 7 5 - 15  CBC     Status: Abnormal   Collection Time: 01/05/16  2:13 AM  Result Value Ref Range   WBC 5.8 4.0 - 10.5 K/uL   RBC 2.97 (L) 3.87 - 5.11 MIL/uL   Hemoglobin 8.9 (L) 12.0 - 15.0 g/dL   HCT 29.0 (L) 36.0 - 46.0 %   MCV 97.6 78.0 - 100.0 fL   MCH 30.0 26.0 - 34.0 pg   MCHC 30.7 30.0 - 36.0 g/dL   RDW 16.2 (H) 11.5 - 15.5 %  Platelets 154 150 - 400 K/uL  Glucose, capillary     Status: Abnormal   Collection Time: 01/05/16  7:39 AM  Result Value Ref Range   Glucose-Capillary 118 (H) 65 - 99 mg/dL   Comment 1 Notify RN    Comment 2 Document in Chart   Glucose, capillary     Status: Abnormal   Collection Time: 01/05/16 11:54 AM  Result Value Ref Range   Glucose-Capillary 200 (H) 65 - 99 mg/dL   Comment 1  Notify RN    Comment 2 Document in Chart   Glucose, capillary     Status: Abnormal   Collection Time: 01/05/16  5:01 PM  Result Value Ref Range   Glucose-Capillary 181 (H) 65 - 99 mg/dL  Glucose, capillary     Status: Abnormal   Collection Time: 01/05/16  9:03 PM  Result Value Ref Range   Glucose-Capillary 142 (H) 65 - 99 mg/dL  Basic metabolic panel     Status: Abnormal   Collection Time: 01/05/16 11:57 PM  Result Value Ref Range   Sodium 136 135 - 145 mmol/L   Potassium 4.6 3.5 - 5.1 mmol/L   Chloride 90 (L) 101 - 111 mmol/L   CO2 38 (H) 22 - 32 mmol/L   Glucose, Bld 102 (H) 65 - 99 mg/dL   BUN 65 (H) 6 - 20 mg/dL   Creatinine, Ser 2.62 (H) 0.44 - 1.00 mg/dL   Calcium 8.8 (L) 8.9 - 10.3 mg/dL   GFR calc non Af Amer 17 (L) >60 mL/min   GFR calc Af Amer 20 (L) >60 mL/min    Comment: (NOTE) The eGFR has been calculated using the CKD EPI equation. This calculation has not been validated in all clinical situations. eGFR's persistently <60 mL/min signify possible Chronic Kidney Disease.    Anion gap 8 5 - 15  Blood gas, arterial     Status: Abnormal   Collection Time: 01/06/16  4:58 AM  Result Value Ref Range   FIO2 50.00    Delivery systems CONTINUOUS POSITIVE AIRWAY PRESSURE    Expiratory PAP 10.00    pH, Arterial 7.367 7.350 - 7.450   pCO2 arterial 69.0 (HH) 35.0 - 45.0 mmHg    Comment: CRITICAL RESULT CALLED TO, READ BACK BY AND VERIFIED WITH: JESSICA MILLER RN _0  BY NAKISHA SARPY RRT ON 01/06/2016    pO2, Arterial 80.0 80.0 - 100.0 mmHg   Bicarbonate 38.6 (H) 20.0 - 24.0 mEq/L   TCO2 40.8 0 - 100 mmol/L   Acid-Base Excess 12.8 (H) 0.0 - 2.0 mmol/L   O2 Saturation 95.3 %   Patient temperature 98.6    Collection site RIGHT RADIAL    Drawn by [phone]    Sample type ARTERIAL DRAW    Allens test (pass/fail) PASS PASS  Protime-INR     Status: Abnormal   Collection Time: 01/06/16  5:16 AM  Result Value Ref Range   Prothrombin Time 21.9 (H) 11.4 - 15.2 seconds   INR  1.88   Magnesium     Status: None   Collection Time: 01/06/16  5:16 AM  Result Value Ref Range   Magnesium 2.2 1.7 - 2.4 mg/dL  CBC with Differential/Platelet     Status: Abnormal   Collection Time: 01/06/16  5:16 AM  Result Value Ref Range   WBC 4.9 4.0 - 10.5 K/uL   RBC 3.20 (L) 3.87 - 5.11 MIL/uL   Hemoglobin 9.3 (L) 12.0 - 15.0 g/dL   HCT 31.8 (L) 36.0 -  46.0 %   MCV 99.4 78.0 - 100.0 fL   MCH 29.1 26.0 - 34.0 pg   MCHC 29.2 (L) 30.0 - 36.0 g/dL   RDW 16.2 (H) 11.5 - 15.5 %   Platelets 176 150 - 400 K/uL   Neutrophils Relative % 58 %   Neutro Abs 2.9 1.7 - 7.7 K/uL   Lymphocytes Relative 22 %   Lymphs Abs 1.1 0.7 - 4.0 K/uL   Monocytes Relative 17 %   Monocytes Absolute 0.8 0.1 - 1.0 K/uL   Eosinophils Relative 3 %   Eosinophils Absolute 0.2 0.0 - 0.7 K/uL   Basophils Relative 0 %   Basophils Absolute 0.0 0.0 - 0.1 K/uL  Glucose, capillary     Status: Abnormal   Collection Time: 01/06/16  8:59 AM  Result Value Ref Range   Glucose-Capillary 108 (H) 65 - 99 mg/dL  Glucose, capillary     Status: Abnormal   Collection Time: 01/06/16 11:28 AM  Result Value Ref Range   Glucose-Capillary 133 (H) 65 - 99 mg/dL   Comment 1 Notify RN    Comment 2 Document in Chart   Glucose, capillary     Status: Abnormal   Collection Time: 01/06/16  5:26 PM  Result Value Ref Range   Glucose-Capillary 231 (H) 65 - 99 mg/dL  Glucose, capillary     Status: Abnormal   Collection Time: 01/06/16  9:46 PM  Result Value Ref Range   Glucose-Capillary 156 (H) 65 - 99 mg/dL   Comment 1 Notify RN   Glucose, capillary     Status: Abnormal   Collection Time: 01/06/16 10:26 PM  Result Value Ref Range   Glucose-Capillary 152 (H) 65 - 99 mg/dL   Comment 1 Document in Chart   Protime-INR     Status: Abnormal   Collection Time: 01/07/16  4:51 AM  Result Value Ref Range   Prothrombin Time 18.8 (H) 11.4 - 15.2 seconds   INR 1.55   CBC with Differential/Platelet     Status: Abnormal   Collection Time:  01/07/16  4:51 AM  Result Value Ref Range   WBC 4.5 4.0 - 10.5 K/uL   RBC 2.95 (L) 3.87 - 5.11 MIL/uL   Hemoglobin 8.6 (L) 12.0 - 15.0 g/dL   HCT 29.8 (L) 36.0 - 46.0 %   MCV 101.0 (H) 78.0 - 100.0 fL   MCH 29.2 26.0 - 34.0 pg   MCHC 28.9 (L) 30.0 - 36.0 g/dL   RDW 16.3 (H) 11.5 - 15.5 %   Platelets 172 150 - 400 K/uL   Neutrophils Relative % 59 %   Neutro Abs 2.6 1.7 - 7.7 K/uL   Lymphocytes Relative 21 %   Lymphs Abs 1.0 0.7 - 4.0 K/uL   Monocytes Relative 17 %   Monocytes Absolute 0.8 0.1 - 1.0 K/uL   Eosinophils Relative 3 %   Eosinophils Absolute 0.1 0.0 - 0.7 K/uL   Basophils Relative 0 %   Basophils Absolute 0.0 0.0 - 0.1 K/uL  Glucose, capillary     Status: Abnormal   Collection Time: 01/07/16  8:15 AM  Result Value Ref Range   Glucose-Capillary 118 (H) 65 - 99 mg/dL   Comment 1 Notify RN    Comment 2 Document in Chart   Glucose, capillary     Status: Abnormal   Collection Time: 01/07/16 11:16 AM  Result Value Ref Range   Glucose-Capillary 124 (H) 65 - 99 mg/dL   Comment 1 Notify RN  Comment 2 Document in Chart   Glucose, capillary     Status: Abnormal   Collection Time: 01/07/16  5:20 PM  Result Value Ref Range   Glucose-Capillary 140 (H) 65 - 99 mg/dL  Basic metabolic panel     Status: Abnormal   Collection Time: 01/07/16  6:53 PM  Result Value Ref Range   Sodium 137 135 - 145 mmol/L   Potassium 4.7 3.5 - 5.1 mmol/L   Chloride 94 (L) 101 - 111 mmol/L   CO2 35 (H) 22 - 32 mmol/L   Glucose, Bld 173 (H) 65 - 99 mg/dL   BUN 63 (H) 6 - 20 mg/dL   Creatinine, Ser 2.75 (H) 0.44 - 1.00 mg/dL   Calcium 8.9 8.9 - 10.3 mg/dL   GFR calc non Af Amer 16 (L) >60 mL/min   GFR calc Af Amer 19 (L) >60 mL/min    Comment: (NOTE) The eGFR has been calculated using the CKD EPI equation. This calculation has not been validated in all clinical situations. eGFR's persistently <60 mL/min signify possible Chronic Kidney Disease.    Anion gap 8 5 - 15  Magnesium     Status:  None   Collection Time: 01/07/16  6:53 PM  Result Value Ref Range   Magnesium 2.0 1.7 - 2.4 mg/dL  Protime-INR     Status: Abnormal   Collection Time: 01/07/16  6:53 PM  Result Value Ref Range   Prothrombin Time 17.9 (H) 11.4 - 15.2 seconds   INR 1.47   Glucose, capillary     Status: Abnormal   Collection Time: 01/07/16  9:23 PM  Result Value Ref Range   Glucose-Capillary 174 (H) 65 - 99 mg/dL  Glucose, capillary     Status: Abnormal   Collection Time: 01/07/16 10:11 PM  Result Value Ref Range   Glucose-Capillary 168 (H) 65 - 99 mg/dL   Comment 1 Notify RN   Protime-INR     Status: Abnormal   Collection Time: 01/08/16  3:49 AM  Result Value Ref Range   Prothrombin Time 18.4 (H) 11.4 - 15.2 seconds   INR 1.51   CBC with Differential/Platelet     Status: Abnormal   Collection Time: 01/08/16  3:49 AM  Result Value Ref Range   WBC 5.3 4.0 - 10.5 K/uL   RBC 3.07 (L) 3.87 - 5.11 MIL/uL   Hemoglobin 9.0 (L) 12.0 - 15.0 g/dL   HCT 31.0 (L) 36.0 - 46.0 %   MCV 101.0 (H) 78.0 - 100.0 fL   MCH 29.3 26.0 - 34.0 pg   MCHC 29.0 (L) 30.0 - 36.0 g/dL   RDW 16.4 (H) 11.5 - 15.5 %   Platelets 172 150 - 400 K/uL   Neutrophils Relative % 59 %   Neutro Abs 3.1 1.7 - 7.7 K/uL   Lymphocytes Relative 22 %   Lymphs Abs 1.1 0.7 - 4.0 K/uL   Monocytes Relative 16 %   Monocytes Absolute 0.8 0.1 - 1.0 K/uL   Eosinophils Relative 3 %   Eosinophils Absolute 0.2 0.0 - 0.7 K/uL   Basophils Relative 0 %   Basophils Absolute 0.0 0.0 - 0.1 K/uL  Basic metabolic panel     Status: Abnormal   Collection Time: 01/08/16  3:49 AM  Result Value Ref Range   Sodium 139 135 - 145 mmol/L   Potassium 4.3 3.5 - 5.1 mmol/L   Chloride 95 (L) 101 - 111 mmol/L   CO2 37 (H) 22 - 32 mmol/L  Glucose, Bld 111 (H) 65 - 99 mg/dL   BUN 62 (H) 6 - 20 mg/dL   Creatinine, Ser 2.42 (H) 0.44 - 1.00 mg/dL   Calcium 8.8 (L) 8.9 - 10.3 mg/dL   GFR calc non Af Amer 19 (L) >60 mL/min   GFR calc Af Amer 22 (L) >60 mL/min     Comment: (NOTE) The eGFR has been calculated using the CKD EPI equation. This calculation has not been validated in all clinical situations. eGFR's persistently <60 mL/min signify possible Chronic Kidney Disease.    Anion gap 7 5 - 15  Magnesium     Status: None   Collection Time: 01/08/16  3:49 AM  Result Value Ref Range   Magnesium 2.0 1.7 - 2.4 mg/dL  Glucose, capillary     Status: None   Collection Time: 01/08/16  7:28 AM  Result Value Ref Range   Glucose-Capillary 98 65 - 99 mg/dL   Comment 1 Notify RN    Comment 2 Document in Chart   Glucose, capillary     Status: Abnormal   Collection Time: 01/08/16 11:28 AM  Result Value Ref Range   Glucose-Capillary 141 (H) 65 - 99 mg/dL   Comment 1 Notify RN    Comment 2 Document in Chart   Glucose, capillary     Status: Abnormal   Collection Time: 01/08/16  4:54 PM  Result Value Ref Range   Glucose-Capillary 146 (H) 65 - 99 mg/dL   Comment 1 Notify RN    Comment 2 Document in Chart   Glucose, capillary     Status: Abnormal   Collection Time: 01/08/16  9:23 PM  Result Value Ref Range   Glucose-Capillary 138 (H) 65 - 99 mg/dL  Protime-INR     Status: Abnormal   Collection Time: 01/09/16  2:40 AM  Result Value Ref Range   Prothrombin Time 18.6 (H) 11.4 - 15.2 seconds   INR 7.86   Basic metabolic panel     Status: Abnormal   Collection Time: 01/09/16  2:40 AM  Result Value Ref Range   Sodium 138 135 - 145 mmol/L   Potassium 4.2 3.5 - 5.1 mmol/L   Chloride 94 (L) 101 - 111 mmol/L   CO2 35 (H) 22 - 32 mmol/L   Glucose, Bld 91 65 - 99 mg/dL   BUN 61 (H) 6 - 20 mg/dL   Creatinine, Ser 2.33 (H) 0.44 - 1.00 mg/dL   Calcium 8.9 8.9 - 10.3 mg/dL   GFR calc non Af Amer 20 (L) >60 mL/min   GFR calc Af Amer 23 (L) >60 mL/min    Comment: (NOTE) The eGFR has been calculated using the CKD EPI equation. This calculation has not been validated in all clinical situations. eGFR's persistently <60 mL/min signify possible Chronic  Kidney Disease.    Anion gap 9 5 - 15  Magnesium     Status: None   Collection Time: 01/09/16  2:40 AM  Result Value Ref Range   Magnesium 2.0 1.7 - 2.4 mg/dL  Glucose, capillary     Status: None   Collection Time: 01/09/16  7:20 AM  Result Value Ref Range   Glucose-Capillary 97 65 - 99 mg/dL   Comment 1 Notify RN    Comment 2 Document in Chart   Glucose, capillary     Status: Abnormal   Collection Time: 01/09/16 11:44 AM  Result Value Ref Range   Glucose-Capillary 141 (H) 65 - 99 mg/dL   Comment 1 Notify  RN    Comment 2 Document in Chart   Glucose, capillary     Status: Abnormal   Collection Time: 01/09/16  4:06 PM  Result Value Ref Range   Glucose-Capillary 260 (H) 65 - 99 mg/dL  POCT INR     Status: None   Collection Time: 01/10/16 12:00 AM  Result Value Ref Range   INR 1.7     Comment: [facility]  POCT INR     Status: None   Collection Time: 01/18/16 12:00 AM  Result Value Ref Range   INR 1.7     Comment: [facility]  POCT INR     Status: None   Collection Time: 01/26/16 12:00 AM  Result Value Ref Range   INR 2.2     Comment: [facility]  POCT INR     Status: None   Collection Time: 02/02/16 12:00 AM  Result Value Ref Range   INR 2.1     Comment: Kathlee Nations with [facility]  POCT INR     Status: None   Collection Time: 02/17/16 12:00 AM  Result Value Ref Range   INR 1.9     Comment: Kathlee Nations [facility] nurse  POCT INR     Status: None   Collection Time: 02/29/16 12:00 AM  Result Value Ref Range   INR 2.1     Comment: [place] nurse    Assessment/Plan: 1. Chronic hip pain, unspecified laterality X-ray of hips bilaterally obtained today revealing arthritic changes of hips, sacrum and sacroiliac joints bilaterally. Discussed that she will likely have flares from time to time. Discussed supportive measures and OTC pain medication with patient. Feel she would greatly benefit from home PT. Patient states she will give some thought to this. - DG HIPS BILAT WITH PELVIS 2V; Future   Leeanne Rio,  PA-C

## 2016-03-20 ENCOUNTER — Encounter: Payer: Self-pay | Admitting: Pulmonary Disease

## 2016-03-21 ENCOUNTER — Ambulatory Visit (INDEPENDENT_AMBULATORY_CARE_PROVIDER_SITE_OTHER): Payer: Medicare Other | Admitting: *Deleted

## 2016-03-21 DIAGNOSIS — I482 Chronic atrial fibrillation, unspecified: Secondary | ICD-10-CM

## 2016-03-21 DIAGNOSIS — Z5181 Encounter for therapeutic drug level monitoring: Secondary | ICD-10-CM | POA: Diagnosis not present

## 2016-03-21 DIAGNOSIS — Z8673 Personal history of transient ischemic attack (TIA), and cerebral infarction without residual deficits: Secondary | ICD-10-CM

## 2016-03-21 DIAGNOSIS — I6350 Cerebral infarction due to unspecified occlusion or stenosis of unspecified cerebral artery: Secondary | ICD-10-CM

## 2016-03-21 DIAGNOSIS — I480 Paroxysmal atrial fibrillation: Secondary | ICD-10-CM

## 2016-03-21 DIAGNOSIS — I4891 Unspecified atrial fibrillation: Secondary | ICD-10-CM

## 2016-03-21 LAB — POCT INR: INR: 2.1

## 2016-03-22 DIAGNOSIS — E039 Hypothyroidism, unspecified: Secondary | ICD-10-CM | POA: Diagnosis not present

## 2016-03-22 DIAGNOSIS — G609 Hereditary and idiopathic neuropathy, unspecified: Secondary | ICD-10-CM | POA: Diagnosis not present

## 2016-03-22 DIAGNOSIS — N189 Chronic kidney disease, unspecified: Secondary | ICD-10-CM | POA: Diagnosis not present

## 2016-03-22 DIAGNOSIS — I10 Essential (primary) hypertension: Secondary | ICD-10-CM | POA: Diagnosis not present

## 2016-03-22 DIAGNOSIS — E1165 Type 2 diabetes mellitus with hyperglycemia: Secondary | ICD-10-CM | POA: Diagnosis not present

## 2016-03-28 ENCOUNTER — Ambulatory Visit (INDEPENDENT_AMBULATORY_CARE_PROVIDER_SITE_OTHER): Payer: Medicare Other | Admitting: *Deleted

## 2016-03-28 ENCOUNTER — Telehealth: Payer: Self-pay | Admitting: Internal Medicine

## 2016-03-28 ENCOUNTER — Telehealth: Payer: Self-pay | Admitting: Cardiology

## 2016-03-28 DIAGNOSIS — Z95810 Presence of automatic (implantable) cardiac defibrillator: Secondary | ICD-10-CM

## 2016-03-28 DIAGNOSIS — I255 Ischemic cardiomyopathy: Secondary | ICD-10-CM | POA: Diagnosis not present

## 2016-03-28 DIAGNOSIS — I5022 Chronic systolic (congestive) heart failure: Secondary | ICD-10-CM

## 2016-03-28 NOTE — Telephone Encounter (Signed)
LMOVM reminding pt to send remote transmission.   

## 2016-03-28 NOTE — Progress Notes (Signed)
Remote ICD transmission.   

## 2016-03-28 NOTE — Telephone Encounter (Signed)
Pt returned called and thinks her device Transmitter is not working.  Pt would like a call back please.

## 2016-03-29 DIAGNOSIS — E1165 Type 2 diabetes mellitus with hyperglycemia: Secondary | ICD-10-CM | POA: Diagnosis not present

## 2016-03-29 NOTE — Telephone Encounter (Signed)
Spoke with patient.. See ICM note 

## 2016-03-29 NOTE — Progress Notes (Signed)
EPIC Encounter for ICM Monitoring  Patient Name: Destiny [name] is a 75 y.o. female Date: 03/29/2016 Primary Care Physican: Penni Homans, MD Primary Cardiologist:Bensimhon Electrophysiologist: Lovena Le Dry Weight:    244.6 lb  Bi-V Pacing:  >99%        Heart Failure questions reviewed, pt asymptomatic   Thoracic impedance normal   Recommendations: No changes.  Advised to limit salt intake to 2000 mg daily.  Encouraged to call for fluid symptoms.    Follow-up plan: ICM clinic phone appointment on 04/30/2016.  Copy of ICM check sent to device physician.   ICM trend: 03/28/2016       [name], RN 03/29/2016 12:43 PM

## 2016-04-04 ENCOUNTER — Encounter: Payer: Self-pay | Admitting: Cardiology

## 2016-04-10 ENCOUNTER — Other Ambulatory Visit: Payer: Self-pay | Admitting: Internal Medicine

## 2016-04-12 ENCOUNTER — Telehealth: Payer: Self-pay | Admitting: Pulmonary Disease

## 2016-04-12 NOTE — Telephone Encounter (Signed)
Per Dr Elsworth Soho: [facility] Compliance download showed good usage  Avg pressure setting is 24/9 No changes needed at this time.

## 2016-04-17 ENCOUNTER — Telehealth: Payer: Self-pay | Admitting: *Deleted

## 2016-04-17 NOTE — Telephone Encounter (Signed)
Results have been explained to patient, pt expressed understanding. Nothing further needed.  

## 2016-04-17 NOTE — Telephone Encounter (Signed)
Pt called stating that she had GI Upset and we  changed her appt to Friday and she states  will call again if she does not feel better

## 2016-04-19 ENCOUNTER — Encounter: Payer: Self-pay | Admitting: Family Medicine

## 2016-04-19 ENCOUNTER — Ambulatory Visit (INDEPENDENT_AMBULATORY_CARE_PROVIDER_SITE_OTHER): Payer: Medicare Other | Admitting: Family Medicine

## 2016-04-19 ENCOUNTER — Telehealth: Payer: Self-pay | Admitting: Family Medicine

## 2016-04-19 ENCOUNTER — Telehealth: Payer: Self-pay | Admitting: *Deleted

## 2016-04-19 ENCOUNTER — Other Ambulatory Visit: Payer: Self-pay | Admitting: Family Medicine

## 2016-04-19 VITALS — BP 116/60 | HR 70 | Temp 98.1°F | Wt 252.4 lb

## 2016-04-19 DIAGNOSIS — E782 Mixed hyperlipidemia: Secondary | ICD-10-CM

## 2016-04-19 DIAGNOSIS — E039 Hypothyroidism, unspecified: Secondary | ICD-10-CM

## 2016-04-19 DIAGNOSIS — E559 Vitamin D deficiency, unspecified: Secondary | ICD-10-CM

## 2016-04-19 DIAGNOSIS — N289 Disorder of kidney and ureter, unspecified: Secondary | ICD-10-CM

## 2016-04-19 DIAGNOSIS — Z794 Long term (current) use of insulin: Secondary | ICD-10-CM

## 2016-04-19 DIAGNOSIS — I6350 Cerebral infarction due to unspecified occlusion or stenosis of unspecified cerebral artery: Secondary | ICD-10-CM

## 2016-04-19 DIAGNOSIS — E11319 Type 2 diabetes mellitus with unspecified diabetic retinopathy without macular edema: Secondary | ICD-10-CM

## 2016-04-19 DIAGNOSIS — E1142 Type 2 diabetes mellitus with diabetic polyneuropathy: Secondary | ICD-10-CM

## 2016-04-19 DIAGNOSIS — J9622 Acute and chronic respiratory failure with hypercapnia: Secondary | ICD-10-CM

## 2016-04-19 DIAGNOSIS — I10 Essential (primary) hypertension: Secondary | ICD-10-CM

## 2016-04-19 DIAGNOSIS — I5042 Chronic combined systolic (congestive) and diastolic (congestive) heart failure: Secondary | ICD-10-CM

## 2016-04-19 DIAGNOSIS — N184 Chronic kidney disease, stage 4 (severe): Secondary | ICD-10-CM

## 2016-04-19 LAB — CBC WITH DIFFERENTIAL/PLATELET
BASOS ABS: 0 10*3/uL (ref 0.0–0.1)
Basophils Relative: 0.4 % (ref 0.0–3.0)
Eosinophils Absolute: 0.2 10*3/uL (ref 0.0–0.7)
Eosinophils Relative: 4.4 % (ref 0.0–5.0)
HCT: 30.6 % — ABNORMAL LOW (ref 36.0–46.0)
Hemoglobin: 10.4 g/dL — ABNORMAL LOW (ref 12.0–15.0)
LYMPHS ABS: 0.7 10*3/uL (ref 0.7–4.0)
LYMPHS PCT: 15.6 % (ref 12.0–46.0)
MCHC: 34 g/dL (ref 30.0–36.0)
MCV: 93.7 fl (ref 78.0–100.0)
MONOS PCT: 15.5 % — AB (ref 3.0–12.0)
Monocytes Absolute: 0.7 10*3/uL (ref 0.1–1.0)
NEUTROS PCT: 64.1 % (ref 43.0–77.0)
Neutro Abs: 3 10*3/uL (ref 1.4–7.7)
Platelets: 126 10*3/uL — ABNORMAL LOW (ref 150.0–400.0)
RBC: 3.27 Mil/uL — AB (ref 3.87–5.11)
RDW: 17.6 % — ABNORMAL HIGH (ref 11.5–15.5)
WBC: 4.7 10*3/uL (ref 4.0–10.5)

## 2016-04-19 LAB — LIPID PANEL
CHOL/HDL RATIO: 4
CHOLESTEROL: 131 mg/dL (ref 0–200)
HDL: 29.8 mg/dL — AB (ref >39.00)
LDL Cholesterol: 65 mg/dL (ref 0–99)
NonHDL: 101.66
TRIGLYCERIDES: 184 mg/dL — AB (ref 0.0–149.0)
VLDL: 36.8 mg/dL (ref 0.0–40.0)

## 2016-04-19 LAB — COMPREHENSIVE METABOLIC PANEL
ALK PHOS: 62 U/L (ref 39–117)
ALT: 21 U/L (ref 0–35)
AST: 25 U/L (ref 0–37)
Albumin: 3.3 g/dL — ABNORMAL LOW (ref 3.5–5.2)
BILIRUBIN TOTAL: 0.8 mg/dL (ref 0.2–1.2)
BUN: 51 mg/dL — ABNORMAL HIGH (ref 6–23)
CALCIUM: 9.7 mg/dL (ref 8.4–10.5)
CO2: 30 mEq/L (ref 19–32)
Chloride: 99 mEq/L (ref 96–112)
Creatinine, Ser: 3.32 mg/dL — ABNORMAL HIGH (ref 0.40–1.20)
GFR: 14.44 mL/min — AB (ref >60.00)
GLUCOSE: 159 mg/dL — AB (ref 70–99)
Potassium: 4.6 mEq/L (ref 3.5–5.1)
Sodium: 137 mEq/L (ref 135–145)
TOTAL PROTEIN: 6.2 g/dL (ref 6.0–8.3)

## 2016-04-19 LAB — VITAMIN D 25 HYDROXY (VIT D DEFICIENCY, FRACTURES): VITD: 35.72 ng/mL (ref 30.00–100.00)

## 2016-04-19 MED ORDER — DOXYCYCLINE HYCLATE 100 MG PO TABS: 100.0000 mg | ORAL_TABLET | Freq: Two times a day (BID) | ORAL | 0 refills | Status: DC

## 2016-04-19 MED ORDER — LORAZEPAM 0.5 MG PO TABS: 0.5000 mg | ORAL_TABLET | Freq: Every day | ORAL | 1 refills | Status: DC

## 2016-04-19 NOTE — Assessment & Plan Note (Signed)
On Levothyroxine, continue to monitor 

## 2016-04-19 NOTE — Assessment & Plan Note (Signed)
Vitamin D check today.  

## 2016-04-19 NOTE — Addendum Note (Signed)
Addended by: Sharon Seller B on: 04/19/2016 02:35 PM   Modules accepted: Orders

## 2016-04-19 NOTE — Assessment & Plan Note (Signed)
Follows with Dr Chalmers Cater of endocrinology last hgba1c 7.2

## 2016-04-19 NOTE — Telephone Encounter (Signed)
Relation to [id number] Call back [employer]   Reason for call:  Patient requesting lab orders at [facility]

## 2016-04-19 NOTE — Assessment & Plan Note (Signed)
Tolerating statin, encouraged heart healthy diet, avoid trans fats, minimize simple carbs and saturated fats. Increase exercise as tolerated 

## 2016-04-19 NOTE — Progress Notes (Signed)
Pre visit review using our clinic review tool, if applicable. No additional management support is needed unless otherwise documented below in the visit note. 

## 2016-04-19 NOTE — Patient Instructions (Signed)
Bananas, rice, applesuace and toast   Food Choices to Help Relieve Diarrhea, Adult When you have diarrhea, the foods you eat and your eating habits are very important. Choosing the right foods and drinks can help relieve diarrhea. Also, because diarrhea can last up to 7 days, you need to replace lost fluids and electrolytes (such as sodium, potassium, and chloride) in order to help prevent dehydration. What general guidelines do I need to follow?  Slowly drink 1 cup (8 oz) of fluid for each episode of diarrhea. If you are getting enough fluid, your urine will be clear or pale yellow.  Eat starchy foods. Some good choices include white rice, white toast, pasta, low-fiber cereal, baked potatoes (without the skin), saltine crackers, and bagels.  Avoid large servings of any cooked vegetables.  Limit fruit to two servings per day. A serving is  cup or 1 small piece.  Choose foods with less than 2 g of fiber per serving.  Limit fats to less than 8 tsp (38 g) per day.  Avoid fried foods.  Eat foods that have probiotics in them. Probiotics can be found in certain dairy products.  Avoid foods and beverages that may increase the speed at which food moves through the stomach and intestines (gastrointestinal tract). Things to avoid include:  High-fiber foods, such as dried fruit, raw fruits and vegetables, nuts, seeds, and whole grain foods.  Spicy foods and high-fat foods.  Foods and beverages sweetened with high-fructose corn syrup, honey, or sugar alcohols such as xylitol, sorbitol, and mannitol. What foods are recommended? Grains  White rice. White, [place], or pita breads (fresh or toasted), including plain rolls, buns, or bagels. White pasta. Saltine, soda, or graham crackers. Pretzels. Low-fiber cereal. Cooked cereals made with water (such as cornmeal, farina, or cream cereals). Plain muffins. Matzo. Melba toast. Zwieback. Vegetables  Potatoes (without the skin). Strained tomato and  vegetable juices. Most well-cooked and canned vegetables without seeds. Tender lettuce. Fruits  Cooked or canned applesauce, apricots, cherries, fruit cocktail, grapefruit, peaches, pears, or plums. Fresh bananas, apples without skin, cherries, grapes, cantaloupe, grapefruit, peaches, oranges, or plums. Meat and Other Protein Products  Baked or boiled chicken. Eggs. Tofu. Fish. Seafood. Smooth peanut butter. Ground or well-cooked tender beef, ham, veal, lamb, pork, or poultry. Dairy  Plain yogurt, kefir, and unsweetened liquid yogurt. Lactose-free milk, buttermilk, or soy milk. Plain hard cheese. Beverages  Sport drinks. Clear broths. Diluted fruit juices (except prune). Regular, caffeine-free sodas such as ginger ale. Water. Decaffeinated teas. Oral rehydration solutions. Sugar-free beverages not sweetened with sugar alcohols. Other  Bouillon, broth, or soups made from recommended foods. The items listed above may not be a complete list of recommended foods or beverages. Contact your dietitian for more options.  What foods are not recommended? Grains  Whole grain, whole wheat, bran, or rye breads, rolls, pastas, crackers, and cereals. Wild or brown rice. Cereals that contain more than 2 g of fiber per serving. Corn tortillas or taco shells. Cooked or dry oatmeal. Granola. Popcorn. Vegetables  Raw vegetables. Cabbage, broccoli, Brussels sprouts, artichokes, baked beans, beet greens, corn, kale, legumes, peas, sweet potatoes, and yams. Potato skins. Cooked spinach and cabbage. Fruits  Dried fruit, including raisins and dates. Raw fruits. Stewed or dried prunes. Fresh apples with skin, apricots, mangoes, pears, raspberries, and strawberries. Meat and Other Protein Products  Chunky peanut butter. Nuts and seeds. Beans and lentils. Berniece Salines. Dairy  High-fat cheeses. Milk, chocolate milk, and beverages made with milk, such as milk  shakes. Cream. Ice cream. Sweets and Desserts  Sweet rolls, doughnuts,  and sweet breads. Pancakes and waffles. Fats and Oils  Butter. Cream sauces. Margarine. Salad oils. Plain salad dressings. Olives. Avocados. Beverages  Caffeinated beverages (such as coffee, tea, soda, or energy drinks). Alcoholic beverages. Fruit juices with pulp. Prune juice. Soft drinks sweetened with high-fructose corn syrup or sugar alcohols. Other  Coconut. Hot sauce. Chili powder. Mayonnaise. Gravy. Cream-based or milk-based soups. The items listed above may not be a complete list of foods and beverages to avoid. Contact your dietitian for more information.  What should I do if I become dehydrated? Diarrhea can sometimes lead to dehydration. Signs of dehydration include dark urine and dry mouth and skin. If you think you are dehydrated, you should rehydrate with an oral rehydration solution. These solutions can be purchased at pharmacies, retail stores, or online. Drink -1 cup (120-240 mL) of oral rehydration solution each time you have an episode of diarrhea. If drinking this amount makes your diarrhea worse, try drinking smaller amounts more often. For example, drink 1-3 tsp (5-15 mL) every 5-10 minutes. A general rule for staying hydrated is to drink 1-2 L of fluid per day. Talk to your health care provider about the specific amount you should be drinking each day. Drink enough fluids to keep your urine clear or pale yellow. This information is not intended to replace advice given to you by your health care provider. Make sure you discuss any questions you have with your health care provider. Document Released: 07/21/2003 Document Revised: 10/06/2015 Document Reviewed: 03/23/2013 Elsevier Interactive Patient Education  2017 [employer].

## 2016-04-19 NOTE — Telephone Encounter (Signed)
Canceled lab appt. And put order in for the[facility] [place] lab.

## 2016-04-19 NOTE — Assessment & Plan Note (Signed)
Well controlled, no changes to meds. Encouraged heart healthy diet such as the DASH diet and exercise as tolerated.  °

## 2016-04-19 NOTE — Telephone Encounter (Signed)
[facility] lab reporting critical GFR @ 14.44

## 2016-04-19 NOTE — Assessment & Plan Note (Addendum)
Check cmp, patientnow following closely with nephrology

## 2016-04-20 ENCOUNTER — Other Ambulatory Visit: Payer: Medicare Other

## 2016-04-20 ENCOUNTER — Ambulatory Visit (INDEPENDENT_AMBULATORY_CARE_PROVIDER_SITE_OTHER): Payer: Medicare Other | Admitting: Pharmacist

## 2016-04-20 ENCOUNTER — Telehealth: Payer: Self-pay | Admitting: Emergency Medicine

## 2016-04-20 ENCOUNTER — Other Ambulatory Visit (INDEPENDENT_AMBULATORY_CARE_PROVIDER_SITE_OTHER): Payer: Medicare Other

## 2016-04-20 ENCOUNTER — Other Ambulatory Visit: Payer: Self-pay | Admitting: Family Medicine

## 2016-04-20 DIAGNOSIS — I6350 Cerebral infarction due to unspecified occlusion or stenosis of unspecified cerebral artery: Secondary | ICD-10-CM | POA: Diagnosis not present

## 2016-04-20 DIAGNOSIS — N289 Disorder of kidney and ureter, unspecified: Secondary | ICD-10-CM | POA: Diagnosis not present

## 2016-04-20 DIAGNOSIS — I482 Chronic atrial fibrillation, unspecified: Secondary | ICD-10-CM

## 2016-04-20 DIAGNOSIS — Z8673 Personal history of transient ischemic attack (TIA), and cerebral infarction without residual deficits: Secondary | ICD-10-CM

## 2016-04-20 DIAGNOSIS — I4891 Unspecified atrial fibrillation: Secondary | ICD-10-CM | POA: Diagnosis not present

## 2016-04-20 DIAGNOSIS — R7989 Other specified abnormal findings of blood chemistry: Secondary | ICD-10-CM

## 2016-04-20 DIAGNOSIS — I480 Paroxysmal atrial fibrillation: Secondary | ICD-10-CM

## 2016-04-20 DIAGNOSIS — Z5181 Encounter for therapeutic drug level monitoring: Secondary | ICD-10-CM | POA: Diagnosis not present

## 2016-04-20 LAB — COMPREHENSIVE METABOLIC PANEL
ALT: 22 U/L (ref 0–35)
AST: 28 U/L (ref 0–37)
Albumin: 3.5 g/dL (ref 3.5–5.2)
Alkaline Phosphatase: 64 U/L (ref 39–117)
BUN: 48 mg/dL — AB (ref 6–23)
CHLORIDE: 97 meq/L (ref 96–112)
CO2: 33 meq/L — AB (ref 19–32)
CREATININE: 3.43 mg/dL — AB (ref 0.40–1.20)
Calcium: 9.8 mg/dL (ref 8.4–10.5)
GFR: 13.91 mL/min — CL (ref >60.00)
GLUCOSE: 104 mg/dL — AB (ref 70–99)
Potassium: 4.7 mEq/L (ref 3.5–5.1)
SODIUM: 137 meq/L (ref 135–145)
Total Bilirubin: 0.9 mg/dL (ref 0.2–1.2)
Total Protein: 6.4 g/dL (ref 6.0–8.3)

## 2016-04-20 LAB — POCT INR: INR: 1.9

## 2016-04-20 NOTE — Telephone Encounter (Signed)
Gill called from [facility] with critical GFR of 13.91. The BUN 48 & Creat 3.43.

## 2016-04-20 NOTE — Telephone Encounter (Signed)
Dr. Charlett Blake reveiwed.

## 2016-04-21 LAB — CUP PACEART REMOTE DEVICE CHECK
Battery Voltage: 2.77 V
Brady Statistic AP VP Percent: 99 %
Brady Statistic AP VS Percent: 1 %
Brady Statistic AS VP Percent: 1 %
Date Time Interrogation Session: 20171115090020
HIGH POWER IMPEDANCE MEASURED VALUE: 52 Ohm
Implantable Lead Implant Date: 20070322
Implantable Lead Location: 753859
Lead Channel Impedance Value: 310 Ohm
Lead Channel Pacing Threshold Amplitude: 0.75 V
Lead Channel Pacing Threshold Amplitude: 1 V
Lead Channel Pacing Threshold Pulse Width: 0.5 ms
Lead Channel Pacing Threshold Pulse Width: 0.6 ms
Lead Channel Sensing Intrinsic Amplitude: 8.6 mV
Lead Channel Setting Pacing Pulse Width: 0.5 ms
Lead Channel Setting Sensing Sensitivity: 0.5 mV
MDC IDC LEAD IMPLANT DT: 20070322
MDC IDC LEAD IMPLANT DT: 20070322
MDC IDC LEAD LOCATION: 753858
MDC IDC LEAD LOCATION: 753860
MDC IDC LEAD MODEL: 7001
MDC IDC MSMT BATTERY REMAINING LONGEVITY: 14 mo
MDC IDC MSMT BATTERY REMAINING PERCENTAGE: 20 %
MDC IDC MSMT LEADCHNL LV IMPEDANCE VALUE: 330 Ohm
MDC IDC MSMT LEADCHNL RA PACING THRESHOLD AMPLITUDE: 0.875 V
MDC IDC MSMT LEADCHNL RA PACING THRESHOLD PULSEWIDTH: 0.5 ms
MDC IDC MSMT LEADCHNL RA SENSING INTR AMPL: 3.6 mV
MDC IDC MSMT LEADCHNL RV IMPEDANCE VALUE: 390 Ohm
MDC IDC PG IMPLANT DT: 20120426
MDC IDC SET LEADCHNL LV PACING AMPLITUDE: 2 V
MDC IDC SET LEADCHNL LV PACING PULSEWIDTH: 0.6 ms
MDC IDC SET LEADCHNL RA PACING AMPLITUDE: 1.875
MDC IDC SET LEADCHNL RV PACING AMPLITUDE: 2.5 V
MDC IDC STAT BRADY AS VS PERCENT: 0 %
MDC IDC STAT BRADY RA PERCENT PACED: 99 %
Pulse Gen Serial Number: 631685

## 2016-04-23 ENCOUNTER — Other Ambulatory Visit (INDEPENDENT_AMBULATORY_CARE_PROVIDER_SITE_OTHER): Payer: Medicare Other

## 2016-04-23 DIAGNOSIS — R7989 Other specified abnormal findings of blood chemistry: Secondary | ICD-10-CM | POA: Diagnosis not present

## 2016-04-23 LAB — COMPREHENSIVE METABOLIC PANEL
ALBUMIN: 3.5 g/dL (ref 3.5–5.2)
ALK PHOS: 66 U/L (ref 39–117)
ALT: 27 U/L (ref 0–35)
AST: 38 U/L — AB (ref 0–37)
BILIRUBIN TOTAL: 0.8 mg/dL (ref 0.2–1.2)
BUN: 49 mg/dL — ABNORMAL HIGH (ref 6–23)
CALCIUM: 9.6 mg/dL (ref 8.4–10.5)
CO2: 32 meq/L (ref 19–32)
CREATININE: 3.74 mg/dL — AB (ref 0.40–1.20)
Chloride: 96 mEq/L (ref 96–112)
GFR: 12.59 mL/min — AB (ref >60.00)
Glucose, Bld: 181 mg/dL — ABNORMAL HIGH (ref 70–99)
Potassium: 4.8 mEq/L (ref 3.5–5.1)
Sodium: 137 mEq/L (ref 135–145)
TOTAL PROTEIN: 6.4 g/dL (ref 6.0–8.3)

## 2016-04-23 NOTE — Telephone Encounter (Signed)
[facility] lab reporting critical GFR @ 12.59

## 2016-04-24 ENCOUNTER — Other Ambulatory Visit: Payer: Self-pay | Admitting: Family Medicine

## 2016-04-24 DIAGNOSIS — R7989 Other specified abnormal findings of blood chemistry: Secondary | ICD-10-CM

## 2016-04-24 DIAGNOSIS — R799 Abnormal finding of blood chemistry, unspecified: Secondary | ICD-10-CM

## 2016-04-24 DIAGNOSIS — N289 Disorder of kidney and ureter, unspecified: Secondary | ICD-10-CM

## 2016-04-27 ENCOUNTER — Telehealth: Payer: Self-pay | Admitting: Emergency Medicine

## 2016-04-27 ENCOUNTER — Other Ambulatory Visit (INDEPENDENT_AMBULATORY_CARE_PROVIDER_SITE_OTHER): Payer: Medicare Other

## 2016-04-27 DIAGNOSIS — R799 Abnormal finding of blood chemistry, unspecified: Secondary | ICD-10-CM

## 2016-04-27 DIAGNOSIS — Z0000 Encounter for general adult medical examination without abnormal findings: Secondary | ICD-10-CM | POA: Diagnosis not present

## 2016-04-27 DIAGNOSIS — D649 Anemia, unspecified: Secondary | ICD-10-CM | POA: Diagnosis not present

## 2016-04-27 DIAGNOSIS — N289 Disorder of kidney and ureter, unspecified: Secondary | ICD-10-CM

## 2016-04-27 DIAGNOSIS — R7989 Other specified abnormal findings of blood chemistry: Secondary | ICD-10-CM

## 2016-04-27 LAB — CBC WITH DIFFERENTIAL/PLATELET
BASOS PCT: 0.6 % (ref 0.0–3.0)
Basophils Absolute: 0 10*3/uL (ref 0.0–0.1)
EOS ABS: 0.2 10*3/uL (ref 0.0–0.7)
Eosinophils Relative: 4.9 % (ref 0.0–5.0)
HCT: 31.7 % — ABNORMAL LOW (ref 36.0–46.0)
Hemoglobin: 10.6 g/dL — ABNORMAL LOW (ref 12.0–15.0)
LYMPHS ABS: 0.9 10*3/uL (ref 0.7–4.0)
Lymphocytes Relative: 19.9 % (ref 12.0–46.0)
MCHC: 33.6 g/dL (ref 30.0–36.0)
MCV: 94.8 fl (ref 78.0–100.0)
MONO ABS: 0.7 10*3/uL (ref 0.1–1.0)
Monocytes Relative: 15.9 % — ABNORMAL HIGH (ref 3.0–12.0)
NEUTROS ABS: 2.6 10*3/uL (ref 1.4–7.7)
Neutrophils Relative %: 58.7 % (ref 43.0–77.0)
PLATELETS: 135 10*3/uL — AB (ref 150.0–400.0)
RBC: 3.34 Mil/uL — ABNORMAL LOW (ref 3.87–5.11)
RDW: 16.6 % — AB (ref 11.5–15.5)
WBC: 4.4 10*3/uL (ref 4.0–10.5)

## 2016-04-27 LAB — COMPREHENSIVE METABOLIC PANEL
ALT: 43 U/L — ABNORMAL HIGH (ref 0–35)
AST: 62 U/L — AB (ref 0–37)
Albumin: 3.5 g/dL (ref 3.5–5.2)
Alkaline Phosphatase: 74 U/L (ref 39–117)
BUN: 44 mg/dL — AB (ref 6–23)
CHLORIDE: 96 meq/L (ref 96–112)
CO2: 34 meq/L — AB (ref 19–32)
CREATININE: 3.73 mg/dL — AB (ref 0.40–1.20)
Calcium: 9.8 mg/dL (ref 8.4–10.5)
GFR: 12.62 mL/min — CL (ref >60.00)
Glucose, Bld: 225 mg/dL — ABNORMAL HIGH (ref 70–99)
Potassium: 4.8 mEq/L (ref 3.5–5.1)
SODIUM: 137 meq/L (ref 135–145)
Total Bilirubin: 0.8 mg/dL (ref 0.2–1.2)
Total Protein: 6 g/dL (ref 6.0–8.3)

## 2016-04-27 NOTE — Telephone Encounter (Signed)
See result note.  

## 2016-04-27 NOTE — Telephone Encounter (Signed)
Gill from [facility] lab called critical results for GFR 12.62. BUN is 44 & Creat is 3.73

## 2016-04-30 ENCOUNTER — Ambulatory Visit (INDEPENDENT_AMBULATORY_CARE_PROVIDER_SITE_OTHER): Payer: Medicare Other

## 2016-04-30 DIAGNOSIS — Z95810 Presence of automatic (implantable) cardiac defibrillator: Secondary | ICD-10-CM

## 2016-04-30 DIAGNOSIS — I5022 Chronic systolic (congestive) heart failure: Secondary | ICD-10-CM | POA: Diagnosis not present

## 2016-04-30 NOTE — Progress Notes (Signed)
EPIC Encounter for ICM Monitoring  Patient Name: CALYN SIVILS is a 75 y.o. female Date: 04/30/2016 Primary Care Physican: Penni Homans, MD Primary Cardiologist:Bensimhon Electrophysiologist: Lovena Le Nephrologist: [place] Dry Weight:244.5lb  Bi-V Pacing: >99%       Heart Failure questions reviewed, pt asymptomatic for fluid symptoms.  She has had a cold/congestion in the last week.   She said Dr Marval Regal has labs scheduled.   Thoracic impedance normal.  Was abnormal suggesting fluid accumulation from 12/3 to 12/10.  Labs: 04/27/2016 Creatinine 3.73, BUN 44, Potassium 4.8, Sodium 137, EGFR 12.62 04/23/2016 Creatinine 3.74, BUN 49, Potassium 4.8, Sodium 137, EGFR 12.59  04/20/2016 Creatinine 3.43, BUN 48, Potassium 4.7, Sodium 137, EGFR 13.91  04/19/2016 Creatinine 3.32, BUN 51, Potassium 4.6, Sodium 137, EGFR 14.44  01/09/2016 Creatinine 2.33, BUN 61, Potassium 4.2, Sodium 138  01/08/2016 Creatinine 2.42, BUN 62, Potassium 4.3, Sodium 139  01/07/2016 Creatinine 2.75, BUN 63, Potassium 4.7, Sodium 137  01/05/2016 Creatinine 2.62, BUN 65, Potassium 4.6, Sodium 136  01/04/2016 Creatinine 2.58, BUN 60, Potassium 4.6, Sodium 135  01/03/2016 Creatinine 2.47, BUN 60, Potassium 4.6, Sodium 135 01/02/2016 Creatinine 2.83, BUN 55, Potassium 4.7, Sodium 140  01/01/2016 Creatinine 2.91, BUN 56, Potassium 4.6, Sodium 137  12/17/2015 Creatinine 2.85, BUN 64, Potassium 5.0, Sodium 139  12/16/2015 Creatinine 3.16, BUN 67, Potassium 4.7, Sodium 137  12/15/2015 Creatinine 3.45, BUN 62, Potassium 5.0, Sodium 136  12/14/2015 Creatinine 3.51, BUN 62, Potassium 5.3, Sodium 133  12/05/2015 Creatinine 2.95, BUN 47, Potassium 4.0, Sodium 138  07/14/2015 Creatinine 2.65, BUN 48, Potassium 4.2, Sodium 140, EGFR 18.77  05/27/2015 Creatinine 3.00, BUN 53, Potassium 4.3, Sodium 140  Recommendations:  No changes.  She stated she knows to call ICM clinic or physician office if she feels she is  retaining fluid and only take extra Torsemide if directed to do so.  Reinforced to limit low salt food choices to 2000 mg day and limiting fluid intake to < 2 liters per day.  Encouraged to call for fluid symptoms.    Follow-up plan: ICM clinic phone appointment on 05/31/2016.  Copy of ICM check sent to device physician.   ICM trend: 04/30/2016       [name], RN 04/30/2016 1:21 PM

## 2016-05-13 NOTE — Assessment & Plan Note (Signed)
minimize simple carbs. Increase exercise as tolerated.  

## 2016-05-13 NOTE — Assessment & Plan Note (Signed)
Stable at present patient very limited by her disease states.

## 2016-05-13 NOTE — Progress Notes (Signed)
Patient ID: GUYNELL ELDEN, female   DOB: 07-18-1940, 75 y.o.   MRN: [id number]   Subjective:    Patient ID: ZALIAH FELDHAUS, female    DOB: [birth year], 75 y.o.   MRN: [id number]  Chief Complaint  Patient presents with  . Follow-up    6-8 week FU for skin lesions on back. States thart the area on her back is getting better.    HPI Patient is in today for follow up accompanied by her husband. She reports seeing her endocrinologist recently and her A1C was 7.2. She notes her am sugars are 100 to 120. She denies polyuria or polydipsia. She is tolerating her respirator. Does still have DOE and is essentially wheelchair bound. Denies CP/palp/SOB/HA/congestion/fevers/GI or GU c/o. Taking meds as prescribed  Past Medical History:  Diagnosis Date  . AICD (automatic cardioverter/defibrillator) present   . Anemia, unspecified 12/13/2013  . Back pain, chronic    "just when I walk; mass on 3rd and 4th vertebrae right lower back"  . Biventricular implantable cardiac defibrillator in situ 2007, 2012   a. 2007;  b. 2012 Gen change: SJM 3231-40 Uni BiV ICD, ser # [id number].  Marland Kitchen CAD (coronary artery disease)    a. 05/2001 CABG x4: LIMA->LAD, VG->D1, VG->D2, VG->RCA.  . Cardiomyopathy, ischemic    a. 2012 s/p SJM 3231-40 Uni BiV ICD, ser # [id number].  . Carotid stenosis    a. 10/19/2011 carotid duplex - Mild hard plaque bilaterally. Stable 40-59% bilateral ICA stenosis. Carotid US (04/2013):  Bilateral 40-59% ICA.  F/u 1 year  . Cerumen impaction 10/28/2015  . Chronic combined systolic and diastolic CHF (congestive heart failure) (HCC)    a. EF as low as 25% in 2006;  b. EF 60-65% in 12/2011;  c. 02/2013 Echo: EF 35-40, mod mid-dist antsept HK, Gr 2 DD, mild LVH.  . CKD (chronic kidney disease), stage III   . COPD (chronic obstructive pulmonary disease) ([place])   . COPD, severe ([place]) 01/22/2016  . 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 Dyslipidemia   .  Gout ~ 08/2011  . Hypercalcemia 09/26/2013  . Hypertension   . Hypothyroidism   . Hypoxemia requiring supplemental oxygen   . LBBB (left bundle branch block)   . Morbid obesity with BMI of 45.0-49.9, adult (HCC)    Ht. 5'4". BMI 47.2  . Mural thrombus of left ventricle    before 2003, while on Coumadin, No h/o CVA  . 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 Benay Spice, in remission  . On home oxygen therapy    "normally 2-3L; 24/7" (12/14/2015)  . OSA on CPAP   . PAF (paroxysmal atrial fibrillation) (HCC)    on Coumadin  . Presence of permanent cardiac pacemaker   . Retinopathy due to secondary diabetes ([place])    type II, uncontrolled  . Vitamin D deficiency 06/09/2012   historical    Past Surgical History:  Procedure Laterality Date  . ABDOMINAL HYSTERECTOMY  1982  . CARDIAC CATHETERIZATION  06/03/01  . CARDIOVERSION N/A 07/30/2014   Procedure: CARDIOVERSION;  Surgeon: Jolaine Artist, MD;  Location: [facility] ENDOSCOPY;  Service: Cardiovascular;  Laterality: N/A;  . CARDIOVERSION N/A 11/25/2014   Procedure: CARDIOVERSION;  Surgeon: Evans Lance, MD;  Location: [facility];  Service: Cardiovascular;  Laterality: N/A;  . CATARACT EXTRACTION     left eye  . CHOLECYSTECTOMY  1970  . CORONARY ARTERY  BYPASS GRAFT  2003   CABG X4  . INSERT / REPLACE / REMOVE PACEMAKER  2007; 2012   w/AICD  . REFRACTIVE SURGERY     right eye  . TEE WITHOUT CARDIOVERSION N/A 12/16/2015   Procedure: TRANSESOPHAGEAL ECHOCARDIOGRAM (TEE);  Surgeon: Jolaine Artist, MD;  Location: [facility] ENDOSCOPY;  Service: Cardiovascular;  Laterality: N/A;  . TUBAL LIGATION  1972    Family History  Problem Relation Age of Onset  . Kidney cancer Mother     kidney and female repo - died @ 60  . Asthma Mother   . Cancer Mother     gyn and renal  . Heart disease Mother     CAD  . Heart disease Father     died @ 58  . Stroke Father   . Diabetes Father   . Hypertension Father   .  Hyperlipidemia Father   . Heart attack Father   . Hypertension Son   . Kidney cancer Maternal Uncle   . Cancer Maternal Uncle   . Cirrhosis Maternal Grandmother     non alcohol  . Cancer Maternal Grandfather   . Kidney cancer Maternal Grandfather   . Hypertension Maternal Aunt     Social History   Social History  . Marital status: Married    Spouse name: N/A  . Number of children: Y  . Years of education: N/A   Occupational History  . 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    Social History Main Topics  . Smoking status: Never Smoker  . Smokeless tobacco: Never Used  . Alcohol use No  . Drug use: No  . Sexual activity: Not Currently     Comment: lives with husband, no major dietary restrictions   Other Topics Concern  . Not on file   Social History Narrative   Lives in [place] with her husband.  She does not routinely exercise or adhere to any particular diet.      Outpatient Medications Prior to Visit  Medication Sig Dispense Refill  . acetaminophen (TYLENOL) 500 MG tablet Take 1,000 mg by mouth 2 (two) times daily as needed for mild pain.     Marland Kitchen albuterol (PROVENTIL HFA;VENTOLIN HFA) 108 (90 BASE) MCG/ACT inhaler Inhale 2 puffs into the lungs every 6 (six) hours as needed for wheezing or shortness of breath. 1 Inhaler 1  . amiodarone (PACERONE) 200 MG tablet Take 200 mg by mouth daily.    Marland Kitchen atorvastatin (LIPITOR) 20 MG tablet TAKE 1 TABLET EVERY DAY (Patient taking differently: Take 20 mg by mouth daily) 90 tablet 1  . carvedilol (COREG) 25 MG tablet TAKE 2 TABLETS TWICE DAILY (Patient taking differently: Take 50 mg by mouth twice daily) 360 tablet 3  . escitalopram (LEXAPRO) 10 MG tablet TAKE 1 TABLET EVERY DAY (Patient taking differently: Take 10 mg by mouth daily) 90 tablet 1  . fenofibrate (TRICOR) 48 MG tablet TAKE 1 TABLET EVERY DAY (Patient taking differently: Take 48 mg by mouth daily) 90 tablet 3  . fluticasone (FLONASE) 50 MCG/ACT nasal spray Place  2 sprays into both nostrils daily as needed for allergies or rhinitis. As needed for nasal stuffiness. 16 g 0  . gabapentin (NEURONTIN) 300 MG capsule Take 2 capsules (600 mg total) by mouth at bedtime. (Patient taking differently: Take 300 mg by mouth 2 (two) times daily. )    . glucose blood (FREESTYLE LITE) test strip DX: 250.60  Check sugars bid and as  needed (Patient taking differently: 1 each by Other route See admin instructions. Check blood sugar twice daily) 100 each 2  . insulin NPH (HUMULIN N,NOVOLIN N) 100 UNIT/ML injection Inject 15-30 Units into the skin 2 (two) times daily before a meal. Take 40 units in the morning and 5 units in the evening    . insulin regular (NOVOLIN R,HUMULIN R) 100 units/mL injection Change to 15 units three times a with meals (to cover all meals--this is the same total amount but spread out) (Patient taking differently: Inject 10-15 Units into the skin 2 (two) times daily before a meal. Change to 15 units three times a with meals (to cover all meals--this is the same total amount but spread out)) 10 mL 12  . OXYGEN Inhale 3 L into the lungs continuous.    . torsemide (DEMADEX) 20 MG tablet Take 2 tablets (40 mg total) by mouth daily. 30 tablet 2  . Vitamin D, Ergocalciferol, (DRISDOL) 50000 UNITS CAPS capsule Take 50,000 Units by mouth every Tuesday.     . warfarin (COUMADIN) 5 MG tablet TAKE AS DIRECTED BY ANTICOAGULATION CLINIC 70 tablet 1  . levothyroxine (SYNTHROID, LEVOTHROID) 25 MCG tablet TAKE 1 TABLET EVERY DAY BEFORE BREAKFAST (Patient taking differently: Take 25 mcg by mouth daily) 90 tablet 1  . LORazepam (ATIVAN) 0.5 MG tablet Take 1 tablet (0.5 mg total) by mouth at bedtime. 90 tablet 1  . rOPINIRole (REQUIP) 0.5 MG tablet Take 1-2 tablets (0.5-1 mg total) by mouth at bedtime. (Patient not taking: Reported on 04/19/2016) 60 tablet 1   No facility-administered medications prior to visit.     Allergies  Allergen Reactions  . Sulfonamide  Derivatives Other (See Comments)    Unknown; "childhood allergy/mother"    Review of Systems  Constitutional: Positive for malaise/fatigue. Negative for fever.  HENT: Negative for congestion.   Eyes: Negative for blurred vision.  Respiratory: Positive for shortness of breath.   Cardiovascular: Positive for leg swelling. Negative for chest pain and palpitations.  Gastrointestinal: Negative for abdominal pain, blood in stool and nausea.  Genitourinary: Negative for dysuria and frequency.  Musculoskeletal: Positive for back pain. Negative for falls.  Skin: Negative for rash.  Neurological: Negative for dizziness, loss of consciousness and headaches.  Endo/Heme/Allergies: Negative for environmental allergies.  Psychiatric/Behavioral: Negative for depression. The patient is not nervous/anxious.        Objective:    Physical Exam  Constitutional: She is oriented to person, place, and time. She appears well-developed and well-nourished. No distress.  HENT:  Head: Normocephalic and atraumatic.  Nose: Nose normal.  Eyes: Right eye exhibits no discharge. Left eye exhibits no discharge.  Neck: Normal range of motion. Neck supple.  Cardiovascular: Normal rate and regular rhythm.   Murmur heard. Pulmonary/Chest: Effort normal and breath sounds normal.  Abdominal: Soft. Bowel sounds are normal. There is no tenderness.  Musculoskeletal: She exhibits edema.  1-2 + pedal edema b/l  Neurological: She is alert and oriented to person, place, and time.  Skin: Skin is warm and dry.  Psychiatric: She has a normal mood and affect.  Nursing note and vitals reviewed.   BP 116/60 (BP Location: Right Arm, Patient Position: Sitting, Cuff Size: Large)   Pulse 70   Temp 98.1 F (36.7 C) (Oral)   Wt 252 lb 6.4 oz (114.5 kg)   SpO2 93% Comment: With O2.  BMI 43.32 kg/m  Wt Readings from Last 3 Encounters:  04/19/16 252 lb 6.4 oz (114.5 kg)  03/19/16  247 lb 4 oz (112.2 kg)  02/21/16 244 lb (110.7  kg)     Lab Results  Component Value Date   WBC 4.4 04/27/2016   HGB 10.6 (L) 04/27/2016   HCT 31.7 (L) 04/27/2016   PLT 135.0 (L) 04/27/2016   GLUCOSE 225 (H) 04/27/2016   CHOL 131 04/19/2016   TRIG 184.0 (H) 04/19/2016   HDL 29.80 (L) 04/19/2016   LDLDIRECT 64.8 05/11/2014   LDLCALC 65 04/19/2016   ALT 43 (H) 04/27/2016   AST 62 (H) 04/27/2016   NA 137 04/27/2016   K 4.8 04/27/2016   CL 96 04/27/2016   CREATININE 3.73 (H) 04/27/2016   BUN 44 (H) 04/27/2016   CO2 34 (H) 04/27/2016   TSH 4.270 12/15/2015   INR 1.9 04/20/2016   HGBA1C 6.6 (H) 12/14/2015   MICROALBUR 77.0 (H) 03/28/2011    Lab Results  Component Value Date   TSH 4.270 12/15/2015   Lab Results  Component Value Date   WBC 4.4 04/27/2016   HGB 10.6 (L) 04/27/2016   HCT 31.7 (L) 04/27/2016   MCV 94.8 04/27/2016   PLT 135.0 (L) 04/27/2016   Lab Results  Component Value Date   NA 137 04/27/2016   K 4.8 04/27/2016   CO2 34 (H) 04/27/2016   GLUCOSE 225 (H) 04/27/2016   BUN 44 (H) 04/27/2016   CREATININE 3.73 (H) 04/27/2016   BILITOT 0.8 04/27/2016   ALKPHOS 74 04/27/2016   AST 62 (H) 04/27/2016   ALT 43 (H) 04/27/2016   PROT 6.0 04/27/2016   ALBUMIN 3.5 04/27/2016   CALCIUM 9.8 04/27/2016   ANIONGAP 9 01/09/2016   GFR 12.62 (LL) 04/27/2016   Lab Results  Component Value Date   CHOL 131 04/19/2016   Lab Results  Component Value Date   HDL 29.80 (L) 04/19/2016   Lab Results  Component Value Date   LDLCALC 65 04/19/2016   Lab Results  Component Value Date   TRIG 184.0 (H) 04/19/2016   Lab Results  Component Value Date   CHOLHDL 4 04/19/2016   Lab Results  Component Value Date   HGBA1C 6.6 (H) 12/14/2015       Assessment & Plan:   Problem List Items Addressed This Visit    Diabetes mellitus type 2 with retinopathy ([place]) - Primary    Follows with Dr Chalmers Cater of endocrinology last hgba1c 7.2      Hyperlipidemia    Tolerating statin, encouraged heart healthy diet, avoid trans  fats, minimize simple carbs and saturated fats. Increase exercise as tolerated      Relevant Orders   Lipid panel (Completed)   Essential hypertension    Well controlled, no changes to meds. Encouraged heart healthy diet such as the DASH diet and exercise as tolerated.       Relevant Orders   Comprehensive metabolic panel (Completed)   CBC w/Diff (Completed)   Vitamin D deficiency    Vitamin D check today      Relevant Orders   Vitamin D (25 hydroxy) (Completed)   Hypothyroidism    On Levothyroxine, continue to monitor      Relevant Medications   levothyroxine (SYNTHROID, LEVOTHROID) 50 MCG tablet   Chronic combined systolic and diastolic CHF (congestive heart failure) ([place])    Stable at present patient very limited by her disease states.      Acute on chronic respiratory failure with hypercapnia (HCC)    Patient now stable on noninvasive respirator at home  Diabetic neuropathy (HCC)    minimize simple carbs. Increase exercise as tolerated.       CKD (chronic kidney disease) stage 4, GFR 15-29 ml/min (HCC)    Check cmp, patientnow following closely with nephrology         I have discontinued Ms. Bozman's rOPINIRole. I am also having her start on doxycycline. Additionally, I am having her maintain her albuterol, insulin NPH Human, insulin regular, glucose blood, Vitamin D (Ergocalciferol), fluticasone, acetaminophen, carvedilol, atorvastatin, fenofibrate, escitalopram, OXYGEN, torsemide, amiodarone, gabapentin, warfarin, LORazepam, and levothyroxine.  Meds ordered this encounter  Medications  . LORazepam (ATIVAN) 0.5 MG tablet    Sig: Take 1 tablet (0.5 mg total) by mouth at bedtime.    Dispense:  90 tablet    Refill:  1  . levothyroxine (SYNTHROID, LEVOTHROID) 50 MCG tablet    Sig: Take 1 tablet (50 mcg total) by mouth daily.    Dispense:  90 tablet    Refill:  0  . doxycycline (VIBRA-TABS) 100 MG tablet    Sig: Take 1 tablet (100 mg total) by mouth 2 (two)  times daily.    Dispense:  20 tablet    Refill:  0    Penni Homans, MD

## 2016-05-13 NOTE — Assessment & Plan Note (Signed)
Patient now stable on noninvasive respirator at home

## 2016-05-15 DIAGNOSIS — N181 Chronic kidney disease, stage 1: Secondary | ICD-10-CM | POA: Diagnosis not present

## 2016-05-15 DIAGNOSIS — D509 Iron deficiency anemia, unspecified: Secondary | ICD-10-CM | POA: Diagnosis not present

## 2016-05-17 ENCOUNTER — Ambulatory Visit (INDEPENDENT_AMBULATORY_CARE_PROVIDER_SITE_OTHER): Payer: Medicare Other

## 2016-05-17 DIAGNOSIS — Z8673 Personal history of transient ischemic attack (TIA), and cerebral infarction without residual deficits: Secondary | ICD-10-CM

## 2016-05-17 DIAGNOSIS — Z5181 Encounter for therapeutic drug level monitoring: Secondary | ICD-10-CM | POA: Diagnosis not present

## 2016-05-17 DIAGNOSIS — I480 Paroxysmal atrial fibrillation: Secondary | ICD-10-CM

## 2016-05-17 LAB — POCT INR: INR: 2

## 2016-05-19 NOTE — Progress Notes (Signed)
Reviewed & agree with plan  

## 2016-05-21 DIAGNOSIS — N184 Chronic kidney disease, stage 4 (severe): Secondary | ICD-10-CM | POA: Diagnosis not present

## 2016-05-21 DIAGNOSIS — I4891 Unspecified atrial fibrillation: Secondary | ICD-10-CM | POA: Diagnosis not present

## 2016-05-21 DIAGNOSIS — G4733 Obstructive sleep apnea (adult) (pediatric): Secondary | ICD-10-CM | POA: Diagnosis not present

## 2016-05-21 DIAGNOSIS — D638 Anemia in other chronic diseases classified elsewhere: Secondary | ICD-10-CM | POA: Diagnosis not present

## 2016-05-21 DIAGNOSIS — D509 Iron deficiency anemia, unspecified: Secondary | ICD-10-CM | POA: Diagnosis not present

## 2016-05-21 DIAGNOSIS — E1129 Type 2 diabetes mellitus with other diabetic kidney complication: Secondary | ICD-10-CM | POA: Diagnosis not present

## 2016-05-21 DIAGNOSIS — N2581 Secondary hyperparathyroidism of renal origin: Secondary | ICD-10-CM | POA: Diagnosis not present

## 2016-05-21 DIAGNOSIS — I129 Hypertensive chronic kidney disease with stage 1 through stage 4 chronic kidney disease, or unspecified chronic kidney disease: Secondary | ICD-10-CM | POA: Diagnosis not present

## 2016-05-21 DIAGNOSIS — E669 Obesity, unspecified: Secondary | ICD-10-CM | POA: Diagnosis not present

## 2016-05-21 DIAGNOSIS — I429 Cardiomyopathy, unspecified: Secondary | ICD-10-CM | POA: Diagnosis not present

## 2016-05-21 DIAGNOSIS — N179 Acute kidney failure, unspecified: Secondary | ICD-10-CM | POA: Diagnosis not present

## 2016-05-21 LAB — HEPATIC FUNCTION PANEL
ALT: 67 U/L — AB (ref 7–35)
AST: 81 U/L — AB (ref 13–35)
Alkaline Phosphatase: 84 U/L (ref 25–125)
Bilirubin, Total: 0.5 mg/dL

## 2016-05-21 LAB — BASIC METABOLIC PANEL
BUN: 58 mg/dL — AB (ref 4–21)
CREATININE: 4.3 mg/dL — AB (ref 0.5–1.1)
GLUCOSE: 221 mg/dL
POTASSIUM: 4.4 mmol/L (ref 3.4–5.3)
SODIUM: 134 mmol/L — AB (ref 137–147)

## 2016-05-22 ENCOUNTER — Encounter: Payer: Self-pay | Admitting: Pulmonary Disease

## 2016-05-29 ENCOUNTER — Encounter: Payer: Self-pay | Admitting: Pulmonary Disease

## 2016-05-29 ENCOUNTER — Encounter: Payer: Self-pay | Admitting: Family Medicine

## 2016-05-29 ENCOUNTER — Ambulatory Visit (INDEPENDENT_AMBULATORY_CARE_PROVIDER_SITE_OTHER): Payer: Medicare Other | Admitting: Pulmonary Disease

## 2016-05-29 DIAGNOSIS — E039 Hypothyroidism, unspecified: Secondary | ICD-10-CM | POA: Diagnosis not present

## 2016-05-29 DIAGNOSIS — G4733 Obstructive sleep apnea (adult) (pediatric): Secondary | ICD-10-CM | POA: Diagnosis not present

## 2016-05-29 DIAGNOSIS — E7800 Pure hypercholesterolemia, unspecified: Secondary | ICD-10-CM | POA: Diagnosis not present

## 2016-05-29 NOTE — Assessment & Plan Note (Signed)
Check report on noninvasive ventilator from [place] nocturnal oximetry on 3 L oxygen during sleep Stay on 3 L oxygen until we get back to you  Weight loss encouraged, compliance with goal of at least 4-6 hrs every night is the expectation. Advised against medications with sedative side effects Cautioned against driving when sleepy - understanding that sleepiness will vary on a day to day basis

## 2016-05-29 NOTE — Patient Instructions (Signed)
Check report on noninvasive ventilator from [place] nocturnal oximetry on 3 L oxygen during sleep Stay on 3 L oxygen until we get back to you

## 2016-05-29 NOTE — Progress Notes (Signed)
   Subjective:    [name], female    DOB: [birth year], 76 y.o.   MRN: [id number]  HPI  76 year old never smoker for follow-up of obstructive sleep apnea , maintained on CPAP since 2003. She has a history of  ischemic cardiomyopathy, s/p biventricular ICD implantation.  She has a history of chronic congestive heart failure, with a chronically elevated BNP. She also has chronic renal insufficiency, is oxygen dependent, and is on chronic anticoagulation for paroxysmal atrial fibrillation.  She has never smoked, but was told around the age of 36 that she may have asthma. She is also morbidly obese, and is very deconditioned with musculoskeletal debility.  05/29/2016   Chief Complaint  Patient presents with  . Follow-up    4 mo f/u. Breathing has been ok since last visit. Denies SOB and chest pain.     She was maintained on CPAP for many years but after hospital admission in 12/2015 was placed on noninvasive ventilation 12/6 with full face mask with 5 L oxygen blended in  Her husband has been checking her saturations with an oximeter and has dropped her to 4 L. She seems to be stable on this. She takes Demadex 40 mg daily and her creatinine is increased to 4.3 on last check 1/8 ([place]) She was noted to be hypercalcemic and vitamin D supplements were stopped she is due for repeat blood work today  She uses a motorized scooter and is accompanied by her husband today  She has lost about 30 pounds to her current weight of 235 pounds   Significant tests/ events  NPSG 2003:  AHI 41/hr, cpap titrated to 10cm PFTs 10/2014 >>FEV1 48%, no BD response, TLC 50%, DLCO 44%  12/2015 adm for for acute on chronic respiratory failure secondary to acute on chronic combined CHF, decompensated OSA/OHS and resultant secondary PAH with decompensated cor pulmonale.  She improved with volume removal and bipap and was discharged with nocturnal bipap 12/6.     Review of Systems neg for any  significant sore throat, dysphagia, itching, sneezing, nasal congestion or excess/ purulent secretions, fever, chills, sweats, unintended wt loss, pleuritic or exertional cp, hempoptysis, orthopnea pnd or change in chronic leg swelling. Also denies presyncope, palpitations, heartburn, abdominal pain, nausea, vomiting, diarrhea or change in bowel or urinary habits, dysuria,hematuria, rash, arthralgias, visual complaints, headache, numbness weakness or ataxia.     Objective:   Physical Exam   Gen. Pleasant, obese, in no distress ENT - no lesions, no post nasal drip Neck: No JVD, no thyromegaly, no carotid bruits Lungs: no use of accessory muscles, no dullness to percussion, decreased without rales or rhonchi  Cardiovascular: Rhythm regular, heart sounds  normal, no murmurs or gallops, no peripheral edema Musculoskeletal: No deformities, no cyanosis or clubbing , no tremors        Assessment & Plan:

## 2016-05-31 ENCOUNTER — Ambulatory Visit (INDEPENDENT_AMBULATORY_CARE_PROVIDER_SITE_OTHER): Payer: Medicare Other

## 2016-05-31 DIAGNOSIS — Z95810 Presence of automatic (implantable) cardiac defibrillator: Secondary | ICD-10-CM

## 2016-05-31 DIAGNOSIS — I5022 Chronic systolic (congestive) heart failure: Secondary | ICD-10-CM | POA: Diagnosis not present

## 2016-06-04 DIAGNOSIS — N181 Chronic kidney disease, stage 1: Secondary | ICD-10-CM | POA: Diagnosis not present

## 2016-06-05 NOTE — Progress Notes (Signed)
Late entry for 06/04/2016 call.  EPIC Encounter for ICM Monitoring  Patient Name: Destiny [name] is a 76 y.o. female Date: 06/05/2016 Primary Care Physican: Penni Homans, MD Primary Cardiologist:Bensimhon Electrophysiologist: Lovena Le Nephrologist: Col[place] Dry Weight:232.4lb  Bi-V Pacing: >99%                                                  Heart Failure questions reviewed, pt asymptomatic for fluid symptoms.   She had office visit with Dr Marval Regal last week and Torsemide was adjusted due to Creatinine was 4.3.  He stopped all vitamins as well.  She had class on dialysis 2/2 and she thinks it may be related to nutrition and how to decrease Creatinine.    Thoracic impedance normal.   Labs: 05/21/2016 Creatinine 4.3, BUN 58, Potassium 4.4, Sodium 134 04/27/2016 Creatinine 3.73, BUN 44, Potassium 4.8, Sodium 137, EGFR 12.62 04/23/2016 Creatinine 3.74, BUN 49, Potassium 4.8, Sodium 137, EGFR 12.59  04/20/2016 Creatinine 3.43, BUN 48, Potassium 4.7, Sodium 137, EGFR 13.91  04/19/2016 Creatinine 3.32, BUN 51, Potassium 4.6, Sodium 137, EGFR 14.44  01/09/2016 Creatinine 2.33, BUN 61, Potassium 4.2, Sodium 138  01/08/2016 Creatinine 2.42, BUN 62, Potassium 4.3, Sodium 139  01/07/2016 Creatinine 2.75, BUN 63, Potassium 4.7, Sodium 137  01/05/2016 Creatinine 2.62, BUN 65, Potassium 4.6, Sodium 136  01/04/2016 Creatinine 2.58, BUN 60, Potassium 4.6, Sodium 135  01/03/2016 Creatinine 2.47, BUN 60, Potassium 4.6, Sodium 135 01/02/2016 Creatinine 2.83, BUN 55, Potassium 4.7, Sodium 140  01/01/2016 Creatinine 2.91, BUN 56, Potassium 4.6, Sodium 137  12/17/2015 Creatinine 2.85, BUN 64, Potassium 5.0, Sodium 139  12/16/2015 Creatinine 3.16, BUN 67, Potassium 4.7, Sodium 137  12/15/2015 Creatinine 3.45, BUN 62, Potassium 5.0, Sodium 136  12/14/2015 Creatinine 3.51, BUN 62, Potassium 5.3, Sodium 133  12/05/2015 Creatinine 2.95, BUN 47, Potassium 4.0, Sodium 138  07/14/2015  Creatinine 2.65, BUN 48, Potassium 4.2, Sodium 140, EGFR 18.77  05/27/2015 Creatinine 3.00, BUN 53, Potassium 4.3, Sodium 140  Recommendations: No changes. Reminded to limit dietary salt intake to 2000 mg/day and fluid intake to < 2 liters/day. Encouraged to call for fluid symptoms.  Follow-up plan: ICM clinic phone appointment on 06/19/2016 to recheck fluid levels since Torsemide has been decreased.   Copy of ICM check sent to primary cardiologist and device physician.   3 month ICM trend: 05/31/2016   1 Year ICM trend:      [name], RN 06/05/2016 8:29 AM

## 2016-06-12 ENCOUNTER — Encounter: Payer: Self-pay | Admitting: Pulmonary Disease

## 2016-06-13 ENCOUNTER — Telehealth: Payer: Self-pay | Admitting: Internal Medicine

## 2016-06-13 ENCOUNTER — Other Ambulatory Visit: Payer: Self-pay | Admitting: Family Medicine

## 2016-06-13 ENCOUNTER — Telehealth (HOSPITAL_COMMUNITY): Payer: Self-pay | Admitting: *Deleted

## 2016-06-13 DIAGNOSIS — I6523 Occlusion and stenosis of bilateral carotid arteries: Secondary | ICD-10-CM

## 2016-06-13 NOTE — Telephone Encounter (Signed)
Pt's husband called in asking if Dr. Haroldine Laws had ordered for patient to have carotid doppler test done at [facility] location.   According to her last office visit, Dr. Haroldine Laws does want her to have this test done prior to her follow up appointment with [place].   I spoke with husband and he appreciates the clarification with no further questions.  He will call [facility] to schedule test.

## 2016-06-14 ENCOUNTER — Ambulatory Visit (INDEPENDENT_AMBULATORY_CARE_PROVIDER_SITE_OTHER): Payer: Medicare Other | Admitting: *Deleted

## 2016-06-14 DIAGNOSIS — Z5181 Encounter for therapeutic drug level monitoring: Secondary | ICD-10-CM

## 2016-06-14 DIAGNOSIS — I480 Paroxysmal atrial fibrillation: Secondary | ICD-10-CM

## 2016-06-14 DIAGNOSIS — Z8673 Personal history of transient ischemic attack (TIA), and cerebral infarction without residual deficits: Secondary | ICD-10-CM | POA: Diagnosis not present

## 2016-06-14 DIAGNOSIS — I6523 Occlusion and stenosis of bilateral carotid arteries: Secondary | ICD-10-CM

## 2016-06-14 LAB — POCT INR: INR: 2.8

## 2016-06-15 DIAGNOSIS — J9611 Chronic respiratory failure with hypoxia: Secondary | ICD-10-CM | POA: Diagnosis not present

## 2016-06-19 ENCOUNTER — Other Ambulatory Visit: Payer: Self-pay

## 2016-06-19 ENCOUNTER — Ambulatory Visit (INDEPENDENT_AMBULATORY_CARE_PROVIDER_SITE_OTHER): Payer: Medicare Other

## 2016-06-19 DIAGNOSIS — Z95810 Presence of automatic (implantable) cardiac defibrillator: Secondary | ICD-10-CM

## 2016-06-19 DIAGNOSIS — N181 Chronic kidney disease, stage 1: Secondary | ICD-10-CM | POA: Diagnosis not present

## 2016-06-19 DIAGNOSIS — I5022 Chronic systolic (congestive) heart failure: Secondary | ICD-10-CM

## 2016-06-19 DIAGNOSIS — J9612 Chronic respiratory failure with hypercapnia: Secondary | ICD-10-CM

## 2016-06-20 ENCOUNTER — Encounter: Payer: Self-pay | Admitting: Pulmonary Disease

## 2016-06-21 NOTE — Progress Notes (Signed)
EPIC Encounter for ICM Monitoring  Patient Name: Destiny [name] is a 76 y.o. female Date: 06/21/2016 Primary Care Physican: Penni Homans, MD Primary Cardiologist:Bensimhon Electrophysiologist: Lovena Le Nephrologist: Coladonato Dry Weight:228lb  Bi-V Pacing: >99%      Heart Failure questions reviewed, pt asymptomatic   Thoracic impedance normal   Labs: 05/21/2016 Creatinine 4.3, BUN 58, Potassium 4.4, Sodium 134 04/27/2016 Creatinine 3.73, BUN 44, Potassium 4.8, Sodium 137, EGFR 12.62 04/23/2016 Creatinine 3.74, BUN 49, Potassium 4.8, Sodium 137, EGFR 12.59  04/20/2016 Creatinine 3.43, BUN 48, Potassium 4.7, Sodium 137, EGFR 13.91  04/19/2016 Creatinine 3.32, BUN 51, Potassium 4.6, Sodium 137, EGFR 14.44  01/09/2016 Creatinine 2.33, BUN 61, Potassium 4.2, Sodium 138  01/08/2016 Creatinine 2.42, BUN 62, Potassium 4.3, Sodium 139  01/07/2016 Creatinine 2.75, BUN 63, Potassium 4.7, Sodium 137  01/05/2016 Creatinine 2.62, BUN 65, Potassium 4.6, Sodium 136  01/04/2016 Creatinine 2.58, BUN 60, Potassium 4.6, Sodium 135  01/03/2016 Creatinine 2.47, BUN 60, Potassium 4.6, Sodium 135 01/02/2016 Creatinine 2.83, BUN 55, Potassium 4.7, Sodium 140  01/01/2016 Creatinine 2.91, BUN 56, Potassium 4.6, Sodium 137  12/17/2015 Creatinine 2.85, BUN 64, Potassium 5.0, Sodium 139  12/16/2015 Creatinine 3.16, BUN 67, Potassium 4.7, Sodium 137  12/15/2015 Creatinine 3.45, BUN 62, Potassium 5.0, Sodium 136  12/14/2015 Creatinine 3.51, BUN 62, Potassium 5.3, Sodium 133  12/05/2015 Creatinine 2.95, BUN 47, Potassium 4.0, Sodium 138  07/14/2015 Creatinine 2.65, BUN 48, Potassium 4.2, Sodium 140, EGFR 18.77  05/27/2015 Creatinine 3.00, BUN 53, Potassium 4.3, Sodium 140  Recommendations: No changes. Reminded to limit dietary salt intake to 2000 mg/day and fluid intake to < 2 liters/day. Encouraged to call for fluid symptoms.  Follow-up plan: ICM clinic phone appointment on 07/05/2016.  Copy of  ICM check sent to primary cardiologist and device physician.   3 month ICM trend: 06/19/2016   1 Year ICM trend:      [name], RN 06/21/2016 1:48 PM

## 2016-06-25 ENCOUNTER — Encounter: Payer: Self-pay | Admitting: Family Medicine

## 2016-06-25 ENCOUNTER — Ambulatory Visit (INDEPENDENT_AMBULATORY_CARE_PROVIDER_SITE_OTHER): Payer: Medicare Other | Admitting: Family Medicine

## 2016-06-25 ENCOUNTER — Ambulatory Visit (HOSPITAL_COMMUNITY)
Admission: RE | Admit: 2016-06-25 | Discharge: 2016-06-25 | Disposition: A | Discharge status: Home / Self Care | Payer: Medicare Other | Source: Ambulatory Visit | Attending: Cardiovascular Disease | Admitting: Cardiovascular Disease

## 2016-06-25 DIAGNOSIS — G4733 Obstructive sleep apnea (adult) (pediatric): Secondary | ICD-10-CM | POA: Diagnosis not present

## 2016-06-25 DIAGNOSIS — R32 Unspecified urinary incontinence: Secondary | ICD-10-CM | POA: Diagnosis not present

## 2016-06-25 DIAGNOSIS — I6523 Occlusion and stenosis of bilateral carotid arteries: Secondary | ICD-10-CM

## 2016-06-25 DIAGNOSIS — Z794 Long term (current) use of insulin: Secondary | ICD-10-CM

## 2016-06-25 DIAGNOSIS — E11319 Type 2 diabetes mellitus with unspecified diabetic retinopathy without macular edema: Secondary | ICD-10-CM

## 2016-06-25 DIAGNOSIS — E039 Hypothyroidism, unspecified: Secondary | ICD-10-CM

## 2016-06-25 DIAGNOSIS — E782 Mixed hyperlipidemia: Secondary | ICD-10-CM

## 2016-06-25 DIAGNOSIS — E559 Vitamin D deficiency, unspecified: Secondary | ICD-10-CM | POA: Diagnosis not present

## 2016-06-25 HISTORY — DX: Unspecified urinary incontinence: R32

## 2016-06-25 LAB — URINALYSIS WITH CULTURE, IF INDICATED
Bilirubin Urine: NEGATIVE
Hgb urine dipstick: NEGATIVE
Ketones, ur: NEGATIVE
LEUKOCYTES UA: NEGATIVE
Nitrite: NEGATIVE
RBC / HPF: NONE SEEN (ref 0–?)
SPECIFIC GRAVITY, URINE: 1.01 (ref 1.000–1.030)
Total Protein, Urine: NEGATIVE
Urine Glucose: NEGATIVE
Urobilinogen, UA: 0.2 (ref 0.0–1.0)
pH: 5.5 (ref 5.0–8.0)

## 2016-06-25 NOTE — Progress Notes (Signed)
Pre visit review using our clinic review tool, if applicable. No additional management support is needed unless otherwise documented below in the visit note. 

## 2016-06-25 NOTE — Assessment & Plan Note (Signed)
Tolerating statin, encouraged heart healthy diet, avoid trans fats, minimize simple carbs and saturated fats. Increase activity as tolerated

## 2016-06-25 NOTE — Assessment & Plan Note (Signed)
Has had her supplements stopped by nephrology. They are checking her labs so will request her last vitamin D check from them

## 2016-06-25 NOTE — Progress Notes (Signed)
[name], female   DOB: 04-10-1941, 76 y.o.   MRN: [id number]   Subjective:    [name], female    DOB: [birth year], 76 y.o.   MRN: [id number]  Chief Complaint  Patient presents with  . Diabetes    Diabetes  She presents for her follow-up diabetic visit. She has type 2 diabetes mellitus. Her disease course has been stable. Pertinent negatives for hypoglycemia include no headaches. Pertinent negatives for diabetes include no blurred vision and no chest pain.    Patient is in today for a follow up on diabetes. Patient also has a Hx of CHF, OSA, hypertension, carotid stenosis, cardiomyopathy, COPD, hypercapnia. No additional concerns noted. At this time. She has not had any hospitalizations since the last office visit. She continues to sleep well with her ventilator. She is essentially sedentary and wheelchair bound very little ambulation and has significant fatigue and sob when she is active which is her baseline. Denies CP/palp/HA/congestion/fevers/GI or GU c/o. Taking meds as prescribed  I acted as a Education administrator for Penni Homans, [place], [place]   Past Medical History:  Diagnosis Date  . AICD (automatic cardioverter/defibrillator) present   . Anemia, unspecified 12/13/2013  . Back pain, chronic    "just when I walk; mass on 3rd and 4th vertebrae right lower back"  . Biventricular implantable cardiac defibrillator in situ 2007, 2012   a. 2007;  b. 2012 Gen change: SJM 3231-40 Uni BiV ICD, ser # [id number].  Marland Kitchen CAD (coronary artery disease)    a. 05/2001 CABG x4: LIMA->LAD, VG->D1, VG->D2, VG->RCA.  . Cardiomyopathy, ischemic    a. 2012 s/p SJM 3231-40 Uni BiV ICD, ser # [id number].  . Carotid stenosis    a. 10/19/2011 carotid duplex - Mild hard plaque bilaterally. Stable 40-59% bilateral ICA stenosis. Carotid US (04/2013):  Bilateral 40-59% ICA.  F/u 1 year  . Cerumen impaction 10/28/2015  . Chronic combined systolic and diastolic CHF (congestive heart failure) (HCC)      a. EF as low as 25% in 2006;  b. EF 60-65% in 12/2011;  c. 02/2013 Echo: EF 35-40, mod mid-dist antsept HK, Gr 2 DD, mild LVH.  . CKD (chronic kidney disease), stage III   . COPD (chronic obstructive pulmonary disease) ([place])   . COPD, severe ([place]) 01/22/2016  . 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 Dyslipidemia   . Gout ~ 08/2011  . Hypercalcemia 09/26/2013  . Hypertension   . Hypothyroidism   . Hypoxemia requiring supplemental oxygen   . LBBB (left bundle branch block)   . Morbid obesity with BMI of 45.0-49.9, adult (HCC)    Ht. 5'4". BMI 47.2  . Mural thrombus of left ventricle    before 2003, while on Coumadin, No h/o CVA  . 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 Benay Spice, in remission  . On home oxygen therapy    "normally 2-3L; 24/7" (12/14/2015)  . OSA on CPAP   . PAF (paroxysmal atrial fibrillation) (HCC)    on Coumadin  . Presence of permanent cardiac pacemaker   . 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 Urinary incontinence 06/25/2016  . Vitamin D deficiency 06/09/2012   historical    Past Surgical History:  Procedure Laterality Date  . ABDOMINAL HYSTERECTOMY  1982  . CARDIAC CATHETERIZATION  06/03/01  . CARDIOVERSION N/A 07/30/2014  Procedure: CARDIOVERSION;  Surgeon: Jolaine Artist, MD;  Location: [facility] ENDOSCOPY;  Service: Cardiovascular;  Laterality: N/A;  . CARDIOVERSION N/A 11/25/2014   Procedure: CARDIOVERSION;  Surgeon: Evans Lance, MD;  Location: [place];  Service: Cardiovascular;  Laterality: N/A;  . CATARACT EXTRACTION     left eye  . CHOLECYSTECTOMY  1970  . CORONARY ARTERY BYPASS GRAFT  2003   CABG X4  . INSERT / REPLACE / REMOVE PACEMAKER  2007; 2012   w/AICD  . REFRACTIVE SURGERY     right eye  . TEE WITHOUT CARDIOVERSION N/A 12/16/2015   Procedure: TRANSESOPHAGEAL ECHOCARDIOGRAM (TEE);  Surgeon: Jolaine Artist, MD;  Location: [facility] ENDOSCOPY;  Service: Cardiovascular;  Laterality: N/A;  . TUBAL LIGATION  1972    Family History  Problem Relation Age of Onset  . Kidney cancer Mother     kidney and female repo - died @ 67  . Asthma Mother   . Cancer Mother     gyn and renal  . Heart disease Mother     CAD  . Heart disease Father     died @ 65  . Stroke Father   . Diabetes Father   . Hypertension Father   . Hyperlipidemia Father   . Heart attack Father   . Hypertension Son   . Kidney cancer Maternal Uncle   . Cancer Maternal Uncle   . Cirrhosis Maternal Grandmother     non alcohol  . Cancer Maternal Grandfather   . Kidney cancer Maternal Grandfather   . Hypertension Maternal Aunt     Social History   Social History  . Marital status: Married    Spouse name: N/A  . Number of children: Y  . Years of education: N/A   Occupational History  . 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    Social History Main Topics  . Smoking status: Never Smoker  . Smokeless tobacco: Never Used  . Alcohol use No  . Drug use: No  . Sexual activity: Not Currently     Comment: lives with husband, no major dietary restrictions   Other Topics Concern  . Not on file   Social History Narrative   Lives in [place] with her husband.  She does not routinely exercise or adhere to any particular diet.      Outpatient Medications Prior to Visit  Medication Sig Dispense Refill  . acetaminophen (TYLENOL) 500 MG tablet Take 1,000 mg by mouth 2 (two) times daily as needed for mild pain.     Marland Kitchen albuterol (PROVENTIL HFA;VENTOLIN HFA) 108 (90 BASE) MCG/ACT inhaler Inhale 2 puffs into the lungs every 6 (six) hours as needed for wheezing or shortness of breath. 1 Inhaler 1  . amiodarone (PACERONE) 200 MG tablet Take 200 mg by mouth daily.    Marland Kitchen atorvastatin (LIPITOR) 20 MG tablet TAKE 1 TABLET EVERY DAY (Patient taking differently: Take 20 mg by mouth daily) 90 tablet 1  . carvedilol (COREG) 25  MG tablet TAKE 2 TABLETS TWICE DAILY (Patient taking differently: Take 50 mg by mouth twice daily) 360 tablet 3  . escitalopram (LEXAPRO) 10 MG tablet TAKE 1 TABLET EVERY DAY (Patient taking differently: Take 10 mg by mouth daily) 90 tablet 1  . fenofibrate (TRICOR) 48 MG tablet TAKE 1 TABLET EVERY DAY (Patient taking differently: Take 48 mg by mouth daily) 90 tablet 3  . fluticasone (FLONASE) 50 MCG/ACT nasal spray Place 2 sprays into both nostrils daily  as needed for allergies or rhinitis. As needed for nasal stuffiness. 16 g 0  . gabapentin (NEURONTIN) 300 MG capsule Take 2 capsules (600 mg total) by mouth at bedtime. (Patient taking differently: Take 300 mg by mouth at bedtime. Take 1 tab po qhs.)    . glucose blood (FREESTYLE LITE) test strip DX: 250.60  Check sugars bid and as needed (Patient taking differently: 1 each by Other route See admin instructions. Check blood sugar twice daily) 100 each 2  . insulin NPH (HUMULIN N,NOVOLIN N) 100 UNIT/ML injection Inject 15-30 Units into the skin 2 (two) times daily before a meal. Take 40 units in the morning and 5 units in the evening    . insulin regular (NOVOLIN R,HUMULIN R) 100 units/mL injection Change to 15 units three times a with meals (to cover all meals--this is the same total amount but spread out) (Patient taking differently: Inject 10-15 Units into the skin 2 (two) times daily before a meal. Change to 15 units three times a with meals (to cover all meals--this is the same total amount but spread out)) 10 mL 12  . levothyroxine (SYNTHROID, LEVOTHROID) 25 MCG tablet Take 25 mcg by mouth daily before breakfast.    . LORazepam (ATIVAN) 0.5 MG tablet Take 1 tablet (0.5 mg total) by mouth at bedtime. 90 tablet 1  . OXYGEN Inhale 3 L into the lungs continuous.    . torsemide (DEMADEX) 20 MG tablet Take 2 tablets (40 mg total) by mouth daily. (Patient taking differently: Take 40 mg by mouth daily. Dr Marval Regal changed to 2 tablets daily on Friday,  Saturday & Sunday.  1 tablet daily Monday through Thursday.) 30 tablet 2  . warfarin (COUMADIN) 5 MG tablet TAKE AS DIRECTED BY ANTICOAGULATION CLINIC 70 tablet 1   No facility-administered medications prior to visit.     Allergies  Allergen Reactions  . Sulfonamide Derivatives Other (See Comments)    Unknown; "childhood allergy/mother"    Review of Systems  Constitutional: Positive for malaise/fatigue. Negative for fever.  HENT: Negative for congestion.   Eyes: Negative for blurred vision.  Respiratory: Positive for shortness of breath. Negative for cough.   Cardiovascular: Negative for chest pain, palpitations and leg swelling.  Gastrointestinal: Negative for vomiting.  Musculoskeletal: Negative for back pain.  Skin: Negative for rash.  Neurological: Negative for loss of consciousness and headaches.       Objective:    Physical Exam  Constitutional: She is oriented to person, place, and time. She appears well-developed and well-nourished. No distress.  HENT:  Head: Normocephalic and atraumatic.  Eyes: Conjunctivae are normal.  Neck: Normal range of motion. No thyromegaly present.  Cardiovascular: Normal rate and regular rhythm.   Pulmonary/Chest: Effort normal and breath sounds normal. She has no wheezes.  Abdominal: Soft. Bowel sounds are normal. There is no tenderness.  Musculoskeletal: She exhibits no edema or deformity.  Neurological: She is alert and oriented to person, place, and time.  Skin: Skin is warm and dry. She is not diaphoretic.  Psychiatric: She has a normal mood and affect.    BP 105/82 (BP Location: Left Wrist, Patient Position: Sitting, Cuff Size: Normal)   Pulse 70   Temp 98.4 F (36.9 C) (Oral)   Wt 232 lb 3.2 oz (105.3 kg)   SpO2 97% Comment: O2  BMI 39.86 kg/m  Wt Readings from Last 3 Encounters:  06/25/16 232 lb 3.2 oz (105.3 kg)  04/19/16 252 lb 6.4 oz (114.5 kg)  03/19/16 247  lb 4 oz (112.2 kg)     Lab Results  Component Value Date    WBC 4.4 04/27/2016   HGB 10.6 (L) 04/27/2016   HCT 31.7 (L) 04/27/2016   PLT 135.0 (L) 04/27/2016   GLUCOSE 225 (H) 04/27/2016   CHOL 131 04/19/2016   TRIG 184.0 (H) 04/19/2016   HDL 29.80 (L) 04/19/2016   LDLDIRECT 64.8 05/11/2014   LDLCALC 65 04/19/2016   ALT 67 (A) 05/21/2016   AST 81 (A) 05/21/2016   NA 134 (A) 05/21/2016   K 4.4 05/21/2016   CL 96 04/27/2016   CREATININE 4.3 (A) 05/21/2016   BUN 58 (A) 05/21/2016   CO2 34 (H) 04/27/2016   TSH 4.270 12/15/2015   INR 2.8 06/14/2016   HGBA1C 6.6 (H) 12/14/2015   MICROALBUR 77.0 (H) 03/28/2011    Lab Results  Component Value Date   TSH 4.270 12/15/2015   Lab Results  Component Value Date   WBC 4.4 04/27/2016   HGB 10.6 (L) 04/27/2016   HCT 31.7 (L) 04/27/2016   MCV 94.8 04/27/2016   PLT 135.0 (L) 04/27/2016   Lab Results  Component Value Date   NA 134 (A) 05/21/2016   K 4.4 05/21/2016   CO2 34 (H) 04/27/2016   GLUCOSE 225 (H) 04/27/2016   BUN 58 (A) 05/21/2016   CREATININE 4.3 (A) 05/21/2016   BILITOT 0.8 04/27/2016   ALKPHOS 84 05/21/2016   AST 81 (A) 05/21/2016   ALT 67 (A) 05/21/2016   PROT 6.0 04/27/2016   ALBUMIN 3.5 04/27/2016   CALCIUM 9.8 04/27/2016   ANIONGAP 9 01/09/2016   GFR 12.62 (LL) 04/27/2016   Lab Results  Component Value Date   CHOL 131 04/19/2016   Lab Results  Component Value Date   HDL 29.80 (L) 04/19/2016   Lab Results  Component Value Date   LDLCALC 65 04/19/2016   Lab Results  Component Value Date   TRIG 184.0 (H) 04/19/2016   Lab Results  Component Value Date   CHOLHDL 4 04/19/2016   Lab Results  Component Value Date   HGBA1C 6.6 (H) 12/14/2015       Assessment & Plan:   Problem List Items Addressed This Visit    Diabetes mellitus type 2 with retinopathy ([place])    minimize simple carbs. Increase exercise as tolerated. Continue current meds      Hyperlipidemia    Tolerating statin, encouraged heart healthy diet, avoid trans fats, minimize simple carbs  and saturated fats. Increase activity as tolerated      OSA (obstructive sleep apnea)   Vitamin D deficiency    Has had her supplements stopped by nephrology. They are checking her labs so will request her last vitamin D check from them      Hypothyroidism    On Levothyroxine, continue to monitor, will request levels from nephrology      Urinary incontinence   Relevant Orders   Urinalysis with Culture, if indicated (Completed)      I am having Ms. Coulon maintain her albuterol, insulin NPH Human, insulin regular, glucose blood, fluticasone, acetaminophen, carvedilol, atorvastatin, fenofibrate, escitalopram, OXYGEN, torsemide, amiodarone, gabapentin, warfarin, LORazepam, and levothyroxine.  No orders of the defined types were placed in this encounter.   CMA served as Education administrator during this visit. History, Physical and Plan performed by medical provider. Documentation and orders reviewed and attested to.  Penni Homans, MD

## 2016-06-25 NOTE — Patient Instructions (Addendum)
Try learning a new activity that will encourage your to remember what you've learned. Try PLAIN Mucinex (400-600mg ) in the morning and night for the next 2 weeks. Hyperglycemia Hyperglycemia occurs when the level of sugar (glucose) in the blood is too high. Glucose is a type of sugar that provides the body's main source of energy. Certain hormones (insulin and glucagon) control the level of glucose in the blood. Insulin lowers blood glucose, and glucagon increases blood glucose. Hyperglycemia can result from having too little insulin in the bloodstream, or from the body not responding normally to insulin. Hyperglycemia occurs most often in people who have diabetes (diabetes mellitus), but it can happen in people who do not have diabetes. It can develop quickly, and it can be life-threatening if it causes you to become severely dehydrated (diabetic ketoacidosis or hyperglycemic hyperosmolar state). Severe hyperglycemia is a medical emergency. What are the causes? If you have diabetes, hyperglycemia may be caused by:  Diabetes medicine.  Medicines that increase blood glucose or affect your diabetes control.  Not eating enough, or not eating often enough.  Changes in physical activity level.  Being sick or having an infection. If you have prediabetes or undiagnosed diabetes:  Hyperglycemia may be caused by those conditions. If you do not have diabetes, hyperglycemia may be caused by:  Certain medicines, including steroid medicines, beta-blockers, epinephrine, and thiazide diuretics.  Stress.  Serious illness.  Surgery.  Diseases of the pancreas.  Infection. What increases the risk? Hyperglycemia is more likely to develop in people who have risk factors for diabetes, such as:  Having a family member with diabetes.  Having a gene for type 1 diabetes that is passed from parent to child (inherited).  Living in an area with cold weather conditions.  Exposure to certain  viruses.  Certain conditions in which the body's disease-fighting (immune) system attacks itself (autoimmune disorders).  Being overweight or obese.  Having an inactive (sedentary) lifestyle.  Having been diagnosed with insulin resistance.  Having a history of prediabetes, gestational diabetes, or polycystic ovarian syndrome (PCOS).  Being of American-Indian, African-American, Hispanic/Latino, or Asian/Pacific Islander descent. What are the signs or symptoms? Hyperglycemia may not cause any symptoms. If you do have symptoms, they may include early warning signs, such as:  Increased thirst.  Hunger.  Feeling very tired.  Needing to urinate more often than usual.  Blurry vision. Other symptoms may develop if hyperglycemia gets worse, such as:  Dry mouth.  Loss of appetite.  Fruity-smelling breath.  Weakness.  Unexpected or rapid weight gain or weight loss.  Tingling or numbness in the hands or feet.  Headache.  Skin that does not quickly return to normal after being lightly pinched and released (poor skin turgor).  Abdominal pain.  Cuts or bruises that are slow to heal. How is this diagnosed? Hyperglycemia is diagnosed with a blood test to measure your blood glucose level. This blood test is usually done while you are having symptoms. Your health care provider may also do a physical exam and review your medical history. You may have more tests to determine the cause of your hyperglycemia, such as:  A fasting blood glucose (FBG) test. You will not be allowed to eat (you will fast) for at least 8 hours before a blood sample is taken.  An A1c (hemoglobin A1c) blood test. This provides information about blood glucose control over the previous 2-3 months.  An oral glucose tolerance test (OGTT). This measures your blood glucose at two times:  After fasting. This is your baseline blood glucose level.  Two hours after drinking a beverage that contains glucose. How is  this treated? Treatment depends on the cause of your hyperglycemia. Treatment may include:  Taking medicine to regulate your blood glucose levels. If you take insulin or other diabetes medicines, your medicine or dosage may be adjusted.  Lifestyle changes, such as exercising more, eating healthier foods, or losing weight.  Treating an illness or infection, if this caused your hyperglycemia.  Checking your blood glucose more often.  Stopping or reducing steroid medicines, if these caused your hyperglycemia. If your hyperglycemia becomes severe and it results in hyperglycemic hyperosmolar state, you must be hospitalized and given IV fluids. Follow these instructions at home: General instructions  Take over-the-counter and prescription medicines only as told by your health care provider.  Do not use any products that contain nicotine or tobacco, such as cigarettes and e-cigarettes. If you need help quitting, ask your health care provider.  Limit alcohol intake to no more than 1 drink per day for nonpregnant women and 2 drinks per day for men. One drink equals 12 oz of beer, 5 oz of wine, or 1 oz of hard liquor.  Learn to manage stress. If you need help with this, ask your health care provider.  Keep all follow-up visits as told by your health care provider. This is important. Eating and drinking  Maintain a healthy weight.  Exercise regularly, as directed by your health care provider.  Stay hydrated, especially when you exercise, get sick, or spend time in hot temperatures.  Eat healthy foods, such as:  Lean proteins.  Complex carbohydrates.  Fresh fruits and vegetables.  Low-fat dairy products.  Healthy fats.  Drink enough fluid to keep your urine clear or pale yellow. If you have diabetes:   Make sure you know the symptoms of hyperglycemia.  Follow your diabetes management plan, as told by your health care provider. Make sure you:  Take your insulin and medicines  as directed.  Follow your exercise plan.  Follow your meal plan. Eat on time, and do not skip meals.  Check your blood glucose as often as directed. Make sure to check your blood glucose before and after exercise. If you exercise longer or in a different way than usual, check your blood glucose more often.  Follow your sick day plan whenever you cannot eat or drink normally. Make this plan in advance with your health care provider.  Share your diabetes management plan with people in your workplace, school, and household.  Check your urine for ketones when you are ill and as told by your health care provider.  Carry a medical alert card or wear medical alert jewelry. Contact a health care provider if:  Your blood glucose is at or above 240 mg/dL (13.3 mmol/L) for 2 days in a row.  You have problems keeping your blood glucose in your target range.  You have frequent episodes of hyperglycemia. Get help right away if:  You have difficulty breathing.  You have a change in how you think, feel, or act (mental status).  You have nausea or vomiting that does not go away. These symptoms may represent a serious problem that is an emergency. Do not wait to see if the symptoms will go away. Get medical help right away. Call your local emergency services (911 in the [place] Do not drive yourself to the hospital.  Summary  Hyperglycemia occurs when the level of sugar (glucose) in  the blood is too high.  Hyperglycemia is diagnosed with a blood test to measure your blood glucose level. This blood test is usually done while you are having symptoms. Your health care provider may also do a physical exam and review your medical history.  If you have diabetes, follow your diabetes management plan as told by your health care provider.  Contact your health care provider if you have problems keeping your blood glucose in your target range. This information is not intended to replace advice given to you  by your health care provider. Make sure you discuss any questions you have with your health care provider. Document Released: 10/24/2000 Document Revised: 01/16/2016 Document Reviewed: 01/16/2016 Elsevier Interactive Patient Education  2017 [employer].

## 2016-06-25 NOTE — Assessment & Plan Note (Signed)
On Levothyroxine, continue to monitor, will request levels from nephrology

## 2016-06-26 DIAGNOSIS — D509 Iron deficiency anemia, unspecified: Secondary | ICD-10-CM | POA: Diagnosis not present

## 2016-06-26 DIAGNOSIS — N181 Chronic kidney disease, stage 1: Secondary | ICD-10-CM | POA: Diagnosis not present

## 2016-06-27 DIAGNOSIS — D509 Iron deficiency anemia, unspecified: Secondary | ICD-10-CM | POA: Diagnosis not present

## 2016-06-27 DIAGNOSIS — I129 Hypertensive chronic kidney disease with stage 1 through stage 4 chronic kidney disease, or unspecified chronic kidney disease: Secondary | ICD-10-CM | POA: Diagnosis not present

## 2016-06-27 DIAGNOSIS — N179 Acute kidney failure, unspecified: Secondary | ICD-10-CM | POA: Diagnosis not present

## 2016-06-27 DIAGNOSIS — I4891 Unspecified atrial fibrillation: Secondary | ICD-10-CM | POA: Diagnosis not present

## 2016-06-27 DIAGNOSIS — N184 Chronic kidney disease, stage 4 (severe): Secondary | ICD-10-CM | POA: Diagnosis not present

## 2016-06-27 DIAGNOSIS — J9611 Chronic respiratory failure with hypoxia: Secondary | ICD-10-CM | POA: Diagnosis not present

## 2016-06-27 DIAGNOSIS — E1129 Type 2 diabetes mellitus with other diabetic kidney complication: Secondary | ICD-10-CM | POA: Diagnosis not present

## 2016-06-27 DIAGNOSIS — I429 Cardiomyopathy, unspecified: Secondary | ICD-10-CM | POA: Diagnosis not present

## 2016-06-27 DIAGNOSIS — G4733 Obstructive sleep apnea (adult) (pediatric): Secondary | ICD-10-CM | POA: Diagnosis not present

## 2016-06-27 DIAGNOSIS — Z6841 Body Mass Index (BMI) 40.0 and over, adult: Secondary | ICD-10-CM | POA: Diagnosis not present

## 2016-06-27 DIAGNOSIS — N2581 Secondary hyperparathyroidism of renal origin: Secondary | ICD-10-CM | POA: Diagnosis not present

## 2016-06-28 ENCOUNTER — Other Ambulatory Visit: Payer: Self-pay | Admitting: Nephrology

## 2016-06-28 ENCOUNTER — Ambulatory Visit
Admission: RE | Admit: 2016-06-28 | Discharge: 2016-06-28 | Disposition: A | Discharge status: Home / Self Care | Payer: Medicare Other | Source: Ambulatory Visit | Attending: Nephrology | Admitting: Nephrology

## 2016-06-28 DIAGNOSIS — C8590 Non-Hodgkin lymphoma, unspecified, unspecified site: Secondary | ICD-10-CM

## 2016-06-28 DIAGNOSIS — R0602 Shortness of breath: Secondary | ICD-10-CM | POA: Diagnosis not present

## 2016-07-02 NOTE — Assessment & Plan Note (Addendum)
minimize simple carbs. Increase exercise as tolerated. Continue current meds  

## 2016-07-04 ENCOUNTER — Other Ambulatory Visit: Payer: Self-pay | Admitting: Nephrology

## 2016-07-04 DIAGNOSIS — Z1231 Encounter for screening mammogram for malignant neoplasm of breast: Secondary | ICD-10-CM

## 2016-07-05 ENCOUNTER — Ambulatory Visit (INDEPENDENT_AMBULATORY_CARE_PROVIDER_SITE_OTHER): Payer: Medicare Other | Admitting: *Deleted

## 2016-07-05 DIAGNOSIS — Z95810 Presence of automatic (implantable) cardiac defibrillator: Secondary | ICD-10-CM | POA: Diagnosis not present

## 2016-07-05 DIAGNOSIS — I5022 Chronic systolic (congestive) heart failure: Secondary | ICD-10-CM

## 2016-07-05 DIAGNOSIS — I255 Ischemic cardiomyopathy: Secondary | ICD-10-CM

## 2016-07-05 NOTE — Progress Notes (Signed)
Remote ICD transmission.   

## 2016-07-06 ENCOUNTER — Encounter: Payer: Self-pay | Admitting: Cardiology

## 2016-07-06 LAB — CUP PACEART REMOTE DEVICE CHECK
Battery Remaining Longevity: 10 mo
Battery Remaining Percentage: 14 %
Brady Statistic AP VP Percent: 99 %
Brady Statistic AS VS Percent: 1 %
HighPow Impedance: 52 Ohm
Implantable Lead Implant Date: 20070322
Implantable Lead Location: 753859
Implantable Lead Location: 753860
Implantable Lead Model: 7001
Lead Channel Impedance Value: 330 Ohm
Lead Channel Pacing Threshold Amplitude: 0.75 V
Lead Channel Pacing Threshold Amplitude: 1.25 V
Lead Channel Pacing Threshold Pulse Width: 0.5 ms
Lead Channel Sensing Intrinsic Amplitude: 12 mV
Lead Channel Sensing Intrinsic Amplitude: 3.6 mV
Lead Channel Setting Pacing Amplitude: 2 V
Lead Channel Setting Pacing Amplitude: 2.25 V
Lead Channel Setting Pacing Amplitude: 2.5 V
Lead Channel Setting Pacing Pulse Width: 0.5 ms
Lead Channel Setting Pacing Pulse Width: 0.6 ms
MDC IDC LEAD IMPLANT DT: 20070322
MDC IDC LEAD IMPLANT DT: 20070322
MDC IDC LEAD LOCATION: 753858
MDC IDC MSMT BATTERY VOLTAGE: 2.72 V
MDC IDC MSMT LEADCHNL LV IMPEDANCE VALUE: 340 Ohm
MDC IDC MSMT LEADCHNL LV PACING THRESHOLD PULSEWIDTH: 0.6 ms
MDC IDC MSMT LEADCHNL RV IMPEDANCE VALUE: 410 Ohm
MDC IDC MSMT LEADCHNL RV PACING THRESHOLD AMPLITUDE: 0.75 V
MDC IDC MSMT LEADCHNL RV PACING THRESHOLD PULSEWIDTH: 0.5 ms
MDC IDC PG IMPLANT DT: 20120426
MDC IDC SESS DTM: 20180222090034
MDC IDC SET LEADCHNL RV SENSING SENSITIVITY: 0.5 mV
MDC IDC STAT BRADY AP VS PERCENT: 1 %
MDC IDC STAT BRADY AS VP PERCENT: 1 %
MDC IDC STAT BRADY RA PERCENT PACED: 99 %
Pulse Gen Serial Number: 631685

## 2016-07-06 NOTE — Progress Notes (Signed)
EPIC Encounter for ICM Monitoring  Patient Name: Destiny [name] is a 76 y.o. female Date: 07/06/2016 Primary Care Physican: Penni Homans, MD Primary [place] Electrophysiologist: Lovena Le Nephrologist: [place] Dry Weight:229lb  Bi-V Pacing: >99%      Heart Failure questions reviewed, pt asymptomatic    Thoracic impedance normal but did have a few days in last 2 weeks that were abnormal suggesting fluid accumulation. She said she could tell she had some fluid but it balanced out and feeling fine now.  Prescribed: Torsemide and Dr Marval Regal prescribed 2 tablets daily on Friday, Saturday & Sunday. 1 tablet daily Monday through Thursday.  Labs: 05/21/2016 Creatinine 4.3, BUN 58, Potassium 4.4, Sodium 134 04/27/2016 Creatinine 3.73, BUN 44, Potassium 4.8, Sodium 137, EGFR 12.62 04/23/2016 Creatinine 3.74, BUN 49, Potassium 4.8, Sodium 137, EGFR 12.59  04/20/2016 Creatinine 3.43, BUN 48, Potassium 4.7, Sodium 137, EGFR 13.91  04/19/2016 Creatinine 3.32, BUN 51, Potassium 4.6, Sodium 137, EGFR 14.44  01/09/2016 Creatinine 2.33, BUN 61, Potassium 4.2, Sodium 138  01/08/2016 Creatinine 2.42, BUN 62, Potassium 4.3, Sodium 139  01/07/2016 Creatinine 2.75, BUN 63, Potassium 4.7, Sodium 137  01/05/2016 Creatinine 2.62, BUN 65, Potassium 4.6, Sodium 136  01/04/2016 Creatinine 2.58, BUN 60, Potassium 4.6, Sodium 135  01/03/2016 Creatinine 2.47, BUN 60, Potassium 4.6, Sodium 135 01/02/2016 Creatinine 2.83, BUN 55, Potassium 4.7, Sodium 140  01/01/2016 Creatinine 2.91, BUN 56, Potassium 4.6, Sodium 137  12/17/2015 Creatinine 2.85, BUN 64, Potassium 5.0, Sodium 139  12/16/2015 Creatinine 3.16, BUN 67, Potassium 4.7, Sodium 137  12/15/2015 Creatinine 3.45, BUN 62, Potassium 5.0, Sodium 136  12/14/2015 Creatinine 3.51, BUN 62, Potassium 5.3, Sodium 133  12/05/2015 Creatinine 2.95, BUN 47, Potassium 4.0, Sodium 138  07/14/2015 Creatinine 2.65, BUN 48, Potassium 4.2, Sodium  140, EGFR 18.77  05/27/2015 Creatinine 3.00, BUN 53, Potassium 4.3, Sodium 140   Recommendations: No changes. Reminded to limit dietary salt intake to 2000 mg/day and fluid intake to < 2 liters/day. Encouraged to call for fluid symptoms.  Follow-up plan: ICM clinic phone appointment on 08/06/2016.  Copy of ICM check sent to device physician.   3 month ICM trend: 07/06/2016   1 Year ICM trend:      [name], RN 07/06/2016 12:49 PM

## 2016-07-09 DIAGNOSIS — N181 Chronic kidney disease, stage 1: Secondary | ICD-10-CM | POA: Diagnosis not present

## 2016-07-09 NOTE — Telephone Encounter (Signed)
error 

## 2016-07-12 ENCOUNTER — Encounter (HOSPITAL_COMMUNITY): Payer: Self-pay | Admitting: Internal Medicine

## 2016-07-12 ENCOUNTER — Ambulatory Visit (HOSPITAL_COMMUNITY)
Admission: RE | Admit: 2016-07-12 | Discharge: 2016-07-12 | Disposition: A | Discharge status: Home / Self Care | Payer: Medicare Other | Source: Ambulatory Visit | Attending: Internal Medicine | Admitting: Internal Medicine

## 2016-07-12 ENCOUNTER — Ambulatory Visit (INDEPENDENT_AMBULATORY_CARE_PROVIDER_SITE_OTHER): Payer: Medicare Other | Admitting: *Deleted

## 2016-07-12 VITALS — BP 102/72 | HR 71 | Wt 234.8 lb

## 2016-07-12 DIAGNOSIS — Z8673 Personal history of transient ischemic attack (TIA), and cerebral infarction without residual deficits: Secondary | ICD-10-CM

## 2016-07-12 DIAGNOSIS — Z5181 Encounter for therapeutic drug level monitoring: Secondary | ICD-10-CM | POA: Diagnosis not present

## 2016-07-12 DIAGNOSIS — E785 Hyperlipidemia, unspecified: Secondary | ICD-10-CM | POA: Diagnosis not present

## 2016-07-12 DIAGNOSIS — Z79899 Other long term (current) drug therapy: Secondary | ICD-10-CM | POA: Insufficient documentation

## 2016-07-12 DIAGNOSIS — I5042 Chronic combined systolic (congestive) and diastolic (congestive) heart failure: Secondary | ICD-10-CM | POA: Diagnosis not present

## 2016-07-12 DIAGNOSIS — Z7901 Long term (current) use of anticoagulants: Secondary | ICD-10-CM | POA: Insufficient documentation

## 2016-07-12 DIAGNOSIS — I2510 Atherosclerotic heart disease of native coronary artery without angina pectoris: Secondary | ICD-10-CM

## 2016-07-12 DIAGNOSIS — Z951 Presence of aortocoronary bypass graft: Secondary | ICD-10-CM | POA: Diagnosis not present

## 2016-07-12 DIAGNOSIS — I5022 Chronic systolic (congestive) heart failure: Secondary | ICD-10-CM

## 2016-07-12 DIAGNOSIS — N184 Chronic kidney disease, stage 4 (severe): Secondary | ICD-10-CM | POA: Diagnosis not present

## 2016-07-12 DIAGNOSIS — E1122 Type 2 diabetes mellitus with diabetic chronic kidney disease: Secondary | ICD-10-CM | POA: Insufficient documentation

## 2016-07-12 DIAGNOSIS — I255 Ischemic cardiomyopathy: Secondary | ICD-10-CM | POA: Diagnosis not present

## 2016-07-12 DIAGNOSIS — I6523 Occlusion and stenosis of bilateral carotid arteries: Secondary | ICD-10-CM | POA: Insufficient documentation

## 2016-07-12 DIAGNOSIS — G4733 Obstructive sleep apnea (adult) (pediatric): Secondary | ICD-10-CM | POA: Insufficient documentation

## 2016-07-12 DIAGNOSIS — Z9889 Other specified postprocedural states: Secondary | ICD-10-CM | POA: Diagnosis not present

## 2016-07-12 DIAGNOSIS — I480 Paroxysmal atrial fibrillation: Secondary | ICD-10-CM

## 2016-07-12 LAB — POCT INR: INR: 1.5

## 2016-07-12 MED ORDER — CARVEDILOL 25 MG PO TABS: 25.0000 mg | ORAL_TABLET | Freq: Two times a day (BID) | ORAL | 3 refills | Status: DC

## 2016-07-12 NOTE — Patient Instructions (Signed)
Decrease Carvedilol to 25 mg Twice daily   We will contact you in 6 months to schedule your next appointment.

## 2016-07-12 NOTE — Progress Notes (Signed)
[name], female   DOB: 1941-01-08, 76 y.o.   MRN: [id number]    Advanced Heart Failure Clinic Note   PCP: Dr Charlett Blake Nephrologist: Dr Marval Regal EP: Dr Lovena Le  Pulmonary: Dr Gwenette Greet  HPI: Destiny [name] is a 76 y.o. female with a history of CAD, status post CABG in 2003, ischemic cardiomyopathy with variable ejection fractions noted in the past (echo in 12/2011 demonstrated normal LV function with an EF 60-65%), status post CRT-D, chronic combined systolic and diastolic CHF, paroxysmal atrial fibrillation, prior LV mural thrombus, on chronic Coumadin therapy, CKD, T2DM. She has a prior admission to the hospital for acute renal failure in the setting of rhabdomyolysis (while taking colchicine and fenofibrate). Carotid US (12/14): Bilateral ICA stenosis 40-59%.    She was admitted 10/23-11/11/14 with acute on chronic combined systolic and diastolic CHF. ECHO with worsening LV function with an EF of 35-40%. Hospital stay was complicated by a/c renal failure and respiratory failure. Had short term intubation. She was made a DNR/DNI.  Discharge weight 256 lbs. She is on home oxygen 2L by nasal cannula and CPAP at night, OHS/OSA.   Admitted 12/14/15 with AKI and hypoglycemia.  ICD interrogation showed no VT but impedance down suggesting fluid overload. She was thought to be dry on exam, especially with AKI.  Limited echo 12/15/15 with probable EF 40-45%, but concerning for mobile density attached to mitral valve.     TEE 12/16/15 showed EF 40% with RV dysfunction. Small ruptured MV chord and only mild MVR.   Overall feeling well. Over past 2 months struggling with rising creatinine and calcium. Baseline creatinine had been 2.5-3.0 and now up to near 5.0. Followed closely by Dr. Meredeth Ide. Volume status has been stable. Torsemide has been cut back.  Fri-Sun taking 40 daily and 20 daily on other days. No swelling, orthopnea or PND. Weight down to 227-230. Wearing new non-invasive ventilator  machine at night and CO2 has come down nicely.   Corvue-  MOD not functioning properly in clinic today.   ECHO (03/06/13): EF 35-40% ECHO (09/07/13): EF 40%, grade II diastolic dysfunction, mildly decreased RV systolic function with normal RV size. ECHO 10/2014:  EF ~30%. Floating MV chord - which is stable from previous echo. Carotid dopplers (1/16) with 40-59% bilateral ICA stenosis.  TEE 12/16/15 showed EF 40% with RV dysfunction. Small ruptured MV chord and only mild MVR.   Labs  05/01/13 K 5.4 Creatinine 1.46 Pro BNP 646  05/19/13 K 5.0 Creatinine 1.38 3/15 LDL 41, HDL 38, creatinine 1.7 4/15 K 5.2, creatinine 1.8 09/16/13 K 4.6 Creatinine 1.6 10/15 K 5.2, creatinine 1.6, LDL 71, HDL 33, TGs 425, TSH normal 12/15 LDL 65, TGs 337 03/12/15: K 4.2 Creatinine 2.70  ROS: All systems negative except as listed in HPI, PMH and Problem List.  Past Medical History:  Diagnosis Date  . AICD (automatic cardioverter/defibrillator) present   . Anemia, unspecified 12/13/2013  . Back pain, chronic    "just when I walk; mass on 3rd and 4th vertebrae right lower back"  . Biventricular implantable cardiac defibrillator in situ 2007, 2012   a. 2007;  b. 2012 Gen change: SJM 3231-40 Uni BiV ICD, ser # [id number].  Marland Kitchen CAD (coronary artery disease)    a. 05/2001 CABG x4: LIMA->LAD, VG->D1, VG->D2, VG->RCA.  . Cardiomyopathy, ischemic    a. 2012 s/p SJM 3231-40 Uni BiV ICD, ser # [id number].  . Carotid stenosis    a. 10/19/2011 carotid duplex - Mild  hard plaque bilaterally. Stable 40-59% bilateral ICA stenosis. Carotid US (04/2013):  Bilateral 40-59% ICA.  F/u 1 year  . Cerumen impaction 10/28/2015  . Chronic combined systolic and diastolic CHF (congestive heart failure) (HCC)    a. EF as low as 25% in 2006;  b. EF 60-65% in 12/2011;  c. 02/2013 Echo: EF 35-40, mod mid-dist antsept HK, Gr 2 DD, mild LVH.  . CKD (chronic kidney disease), stage III   . COPD (chronic obstructive pulmonary disease) ([place])   . COPD, severe  ([place]) 01/22/2016  . 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 Dyslipidemia   . Gout ~ 08/2011  . Hypercalcemia 09/26/2013  . Hypertension   . Hypothyroidism   . Hypoxemia requiring supplemental oxygen   . LBBB (left bundle branch block)   . Morbid obesity with BMI of 45.0-49.9, adult (HCC)    Ht. 5'4". BMI 47.2  . Mural thrombus of left ventricle    before 2003, while on Coumadin, No h/o CVA  . Non-Hodgkin's lymphoma of inguinal region ([place]) 02/2009   mass; left; B-type; Dr. Benay Spice, in remission  . On home oxygen therapy    "normally 2-3L; 24/7" (12/14/2015)  . OSA on CPAP   . PAF (paroxysmal atrial fibrillation) (HCC)    on Coumadin  . Presence of permanent cardiac pacemaker   . Retinopathy due to secondary diabetes ([place])    type II, uncontrolled  . Urinary incontinence 06/25/2016  . Vitamin D deficiency 06/09/2012   historical    Current Outpatient Prescriptions  Medication Sig Dispense Refill  . acetaminophen (TYLENOL) 500 MG tablet Take 1,000 mg by mouth 2 (two) times daily as needed for mild pain.     Marland Kitchen albuterol (PROVENTIL HFA;VENTOLIN HFA) 108 (90 BASE) MCG/ACT inhaler Inhale 2 puffs into the lungs every 6 (six) hours as needed for wheezing or shortness of breath. 1 Inhaler 1  . amiodarone (PACERONE) 200 MG tablet Take 200 mg by mouth daily.    . carvedilol (COREG) 25 MG tablet TAKE 2 TABLETS TWICE DAILY 360 tablet 3  . escitalopram (LEXAPRO) 10 MG tablet Take 10 mg by mouth daily.    . fluticasone (FLONASE) 50 MCG/ACT nasal spray Place 2 sprays into both nostrils daily as needed for allergies or rhinitis. As needed for nasal stuffiness. 16 g 0  . gabapentin (NEURONTIN) 300 MG capsule Take 300 mg by mouth at bedtime.    Marland Kitchen glucose blood (FREESTYLE LITE) test strip DX: 250.60  Check sugars bid and as needed (Patient taking differently: 1 each by Other route See admin instructions. Check blood sugar twice daily) 100  each 2  . insulin NPH (HUMULIN N,NOVOLIN N) 100 UNIT/ML injection Inject 15-30 Units into the skin 2 (two) times daily before a meal. Take 40 units in the morning and 5 units in the evening    . levothyroxine (SYNTHROID, LEVOTHROID) 25 MCG tablet Take 25 mcg by mouth daily before breakfast.    . LORazepam (ATIVAN) 0.5 MG tablet Take 1 tablet (0.5 mg total) by mouth at bedtime. 90 tablet 1  . OXYGEN Inhale 3 L into the lungs continuous.    . torsemide (DEMADEX) 20 MG tablet Take 20-40 mg by mouth as directed. 2 tablets daily on Friday, Saturday & Sunday.  1 tablet daily Monday through Thursday.    . warfarin (COUMADIN) 5 MG tablet TAKE AS DIRECTED BY ANTICOAGULATION CLINIC 70 tablet 1   No current facility-administered medications  for this encounter.     Vitals:   07/12/16 1030  BP: 102/72  Pulse: 71  SpO2: 97%  Weight: 234 lb 12 oz (106.5 kg)   Wt Readings from Last 3 Encounters:  07/12/16 234 lb 12 oz (106.5 kg)  06/25/16 232 lb 3.2 oz (105.3 kg)  04/19/16 252 lb 6.4 oz (114.5 kg)    PHYSICAL EXAM: General:  Obese, Elderly appearing, NAD;  Husband present. Arrived in scooter.  HEENT: normal; on 2 L continuous 02 via [place]. Neck: Thick, supple. JVP difficult to assess but does not appear elevated.  Carotids 2+ bilaterally; no bruits. No thyromegaly or lymphadenopathy noted.  Cor: PMI normal. RRR. No gallops or rubs.  2/6 early SEM RUSB. Lungs: Clear, normal effort.  Abdomen: Obese, soft, NT, ND, no HSM. No bruits or masses. +BS   Extremities: no cyanosis, clubbing, rash. Trace edema.  Neuro: A&Ox3, cranial nerves grossly intact. Moves all 4 extremities w/o difficulty. Affect pleasant.  ASSESSMENT & PLAN:  1) Chronic combined diastolic/systolic HF: Ischemic cardiomyopathy s/p CRT-D ([employer]). TEE 12/16/15 with EF 40%, RV dysfunction, and floating MV cord similar to previous Echos.  - Improved today  NYHA III symptoms. Volume status stable on exam. - Torsemide dose as directed by Dr.  Jessy Oto - With low BP cut carvedilol back to 25 bid to maximize renal perfusion - No spironolactone, ACE/ARB/ABNI with A/CKD - Reinforced the need and importance of daily weights, a low sodium diet, and fluid restriction (less than 2 L a day). Instructed to call the HF clinic if weight increases more than 3 lbs overnight or 5 lbs in a week. 2) PAF: S/P DC-CV 07/28/14.  - No AFib by recent corevue.  - Continue BB and coumadin. - No bleeding on coumadin. Coumadin clinic follows.  3) OHS/OSA - Continue nightly noninvasive ventilation and chronic home oxygen. I think the reduction in pCO@ has made a huge difference for her.   4) CAD: s/p CABG.   - Denies CP. No ASA with stable CAD and coumadin use. - Continue statin as below. 5) Hyperlipidemia:  - Atorvastatin recently stopped by Dr. Marval Regal. Would like to restart statin soon - Triglycerides very high in 02/2015, now off fenofibrate and atorva. - Further management per PCP. Can refer to lipid clinic as needed.  6) Carotid stenosis:  - Bilateral 1-39% stenosis - Repeat dopplers 05/2016.  7) Acute on CKD Stage III-IV:  Creatinine up to 5. Following with Dr. Marval Regal - Pending AVF fistula placement  Havard Radigan,MD 10:54 AM

## 2016-07-12 NOTE — Addendum Note (Signed)
Encounter addended by: Scarlette Calico, RN on: 07/12/2016 11:10 AM<BR>    Actions taken: Order list changed, Sign clinical note

## 2016-07-18 ENCOUNTER — Ambulatory Visit (INDEPENDENT_AMBULATORY_CARE_PROVIDER_SITE_OTHER): Payer: Medicare Other | Admitting: *Deleted

## 2016-07-18 DIAGNOSIS — I4891 Unspecified atrial fibrillation: Secondary | ICD-10-CM | POA: Diagnosis not present

## 2016-07-18 DIAGNOSIS — I480 Paroxysmal atrial fibrillation: Secondary | ICD-10-CM | POA: Diagnosis not present

## 2016-07-18 DIAGNOSIS — E1129 Type 2 diabetes mellitus with other diabetic kidney complication: Secondary | ICD-10-CM | POA: Diagnosis not present

## 2016-07-18 DIAGNOSIS — Z8673 Personal history of transient ischemic attack (TIA), and cerebral infarction without residual deficits: Secondary | ICD-10-CM | POA: Diagnosis not present

## 2016-07-18 DIAGNOSIS — G4733 Obstructive sleep apnea (adult) (pediatric): Secondary | ICD-10-CM | POA: Diagnosis not present

## 2016-07-18 DIAGNOSIS — N2581 Secondary hyperparathyroidism of renal origin: Secondary | ICD-10-CM | POA: Diagnosis not present

## 2016-07-18 DIAGNOSIS — I255 Ischemic cardiomyopathy: Secondary | ICD-10-CM

## 2016-07-18 DIAGNOSIS — E7800 Pure hypercholesterolemia, unspecified: Secondary | ICD-10-CM | POA: Diagnosis not present

## 2016-07-18 DIAGNOSIS — N179 Acute kidney failure, unspecified: Secondary | ICD-10-CM | POA: Diagnosis not present

## 2016-07-18 DIAGNOSIS — E039 Hypothyroidism, unspecified: Secondary | ICD-10-CM | POA: Diagnosis not present

## 2016-07-18 DIAGNOSIS — D638 Anemia in other chronic diseases classified elsewhere: Secondary | ICD-10-CM | POA: Diagnosis not present

## 2016-07-18 DIAGNOSIS — I129 Hypertensive chronic kidney disease with stage 1 through stage 4 chronic kidney disease, or unspecified chronic kidney disease: Secondary | ICD-10-CM | POA: Diagnosis not present

## 2016-07-18 DIAGNOSIS — Z5181 Encounter for therapeutic drug level monitoring: Secondary | ICD-10-CM

## 2016-07-18 DIAGNOSIS — E1165 Type 2 diabetes mellitus with hyperglycemia: Secondary | ICD-10-CM | POA: Diagnosis not present

## 2016-07-18 DIAGNOSIS — N184 Chronic kidney disease, stage 4 (severe): Secondary | ICD-10-CM | POA: Diagnosis not present

## 2016-07-18 DIAGNOSIS — I429 Cardiomyopathy, unspecified: Secondary | ICD-10-CM | POA: Diagnosis not present

## 2016-07-18 DIAGNOSIS — E669 Obesity, unspecified: Secondary | ICD-10-CM | POA: Diagnosis not present

## 2016-07-18 DIAGNOSIS — D509 Iron deficiency anemia, unspecified: Secondary | ICD-10-CM | POA: Diagnosis not present

## 2016-07-18 LAB — POCT INR: INR: 1.4

## 2016-07-23 ENCOUNTER — Other Ambulatory Visit: Payer: Self-pay | Admitting: Nephrology

## 2016-07-23 DIAGNOSIS — Z1231 Encounter for screening mammogram for malignant neoplasm of breast: Secondary | ICD-10-CM

## 2016-07-24 ENCOUNTER — Other Ambulatory Visit: Payer: Self-pay | Admitting: Vascular Surgery

## 2016-07-24 DIAGNOSIS — E7800 Pure hypercholesterolemia, unspecified: Secondary | ICD-10-CM | POA: Diagnosis not present

## 2016-07-24 DIAGNOSIS — E1165 Type 2 diabetes mellitus with hyperglycemia: Secondary | ICD-10-CM | POA: Diagnosis not present

## 2016-07-24 DIAGNOSIS — N185 Chronic kidney disease, stage 5: Secondary | ICD-10-CM

## 2016-07-24 DIAGNOSIS — E039 Hypothyroidism, unspecified: Secondary | ICD-10-CM | POA: Diagnosis not present

## 2016-07-24 DIAGNOSIS — Z01810 Encounter for preprocedural cardiovascular examination: Secondary | ICD-10-CM

## 2016-07-24 DIAGNOSIS — I10 Essential (primary) hypertension: Secondary | ICD-10-CM | POA: Diagnosis not present

## 2016-07-25 ENCOUNTER — Other Ambulatory Visit: Payer: Self-pay | Admitting: Internal Medicine

## 2016-07-25 ENCOUNTER — Telehealth: Payer: Self-pay | Admitting: Oncology

## 2016-07-25 NOTE — Telephone Encounter (Signed)
Appt has been scheduled for the pt to see Dr. Benay Spice on 3/27 at 2pm. Appt was scheduled w/Shaqueena at [facility]. Voiced understanding and will notify the pt of the appt date and time.

## 2016-07-26 ENCOUNTER — Ambulatory Visit (INDEPENDENT_AMBULATORY_CARE_PROVIDER_SITE_OTHER): Payer: Medicare Other | Admitting: *Deleted

## 2016-07-26 ENCOUNTER — Telehealth: Payer: Self-pay | Admitting: *Deleted

## 2016-07-26 DIAGNOSIS — Z8673 Personal history of transient ischemic attack (TIA), and cerebral infarction without residual deficits: Secondary | ICD-10-CM | POA: Diagnosis not present

## 2016-07-26 DIAGNOSIS — Z5181 Encounter for therapeutic drug level monitoring: Secondary | ICD-10-CM | POA: Diagnosis not present

## 2016-07-26 DIAGNOSIS — I480 Paroxysmal atrial fibrillation: Secondary | ICD-10-CM | POA: Diagnosis not present

## 2016-07-26 DIAGNOSIS — I255 Ischemic cardiomyopathy: Secondary | ICD-10-CM

## 2016-07-26 LAB — POCT INR: INR: 1.3

## 2016-07-26 NOTE — Telephone Encounter (Addendum)
Left message on voicemail for Destiny [name] with Dr. Marval Regal: Why has pt been referred to oncology?  Return call from [place] with [facility]: Message has been sent to MD. He is out of the office until next week. She will get back to [place] with reason for referral.

## 2016-07-27 ENCOUNTER — Telehealth: Payer: Self-pay | Admitting: Nurse Practitioner

## 2016-07-27 ENCOUNTER — Telehealth: Payer: Self-pay | Admitting: *Deleted

## 2016-07-27 NOTE — Telephone Encounter (Signed)
Received return call from [place] with Dr. Marval Regal: Pt is being referred for unexplained hypercalcemia ? reoccurrence. Informed her that pt is scheduled for 3/27 with Dr. Benay Spice.

## 2016-07-27 NOTE — Telephone Encounter (Signed)
Called patient and let then know appt change from 3/27 to 3/19. Patient is aware of new date and time.

## 2016-07-30 ENCOUNTER — Ambulatory Visit (HOSPITAL_BASED_OUTPATIENT_CLINIC_OR_DEPARTMENT_OTHER): Payer: Medicare Other

## 2016-07-30 ENCOUNTER — Ambulatory Visit (HOSPITAL_BASED_OUTPATIENT_CLINIC_OR_DEPARTMENT_OTHER): Payer: Medicare Other | Admitting: Nurse Practitioner

## 2016-07-30 ENCOUNTER — Telehealth: Payer: Self-pay | Admitting: Oncology

## 2016-07-30 VITALS — BP 108/50 | HR 72 | Temp 98.6°F | Resp 16 | Wt 234.0 lb

## 2016-07-30 DIAGNOSIS — Z8572 Personal history of non-Hodgkin lymphomas: Secondary | ICD-10-CM | POA: Diagnosis not present

## 2016-07-30 LAB — COMPREHENSIVE METABOLIC PANEL
ALT: 31 U/L (ref 0–55)
ANION GAP: 10 meq/L (ref 3–11)
AST: 33 U/L (ref 5–34)
Albumin: 3.1 g/dL — ABNORMAL LOW (ref 3.5–5.0)
Alkaline Phosphatase: 124 U/L (ref 40–150)
BILIRUBIN TOTAL: 0.99 mg/dL (ref 0.20–1.20)
BUN: 44 mg/dL — ABNORMAL HIGH (ref 7.0–26.0)
CHLORIDE: 97 meq/L — AB (ref 98–109)
CO2: 29 meq/L (ref 22–29)
Calcium: 10 mg/dL (ref 8.4–10.4)
Creatinine: 3.4 mg/dL (ref 0.6–1.1)
EGFR: 13 mL/min/{1.73_m2} — ABNORMAL LOW (ref >90)
Glucose: 131 mg/dl (ref 70–140)
Potassium: 4.2 mEq/L (ref 3.5–5.1)
Sodium: 137 mEq/L (ref 136–145)
Total Protein: 6.3 g/dL — ABNORMAL LOW (ref 6.4–8.3)

## 2016-07-30 NOTE — Progress Notes (Addendum)
[facility] OFFICE PROGRESS NOTE   Diagnosis:  Non-Hodgkin's lymphoma  INTERVAL HISTORY:   Destiny [name] is a 76 year old woman with a history of high-grade B-cell non-Hodgkin's lymphoma diagnosed 03/21/2009. She completed 6 cycles of CHOP/Rituxan. Adriamycin was deleted and etoposide substituted. She has multiple other medical problems as well. She was last seen in our office in March 2016. She was in clinical remission at that time and greater than 5 years out from diagnosis.  She was recently referred back to our office due to unexplained hypercalcemia. She feels well. No fevers or sweats. She has not noticed any enlarged lymph nodes. She has a good appetite. She has lost some weight which she reports is intentional. She denies constipation. No confusion. About 2-3 weeks ago she noted soreness at the left breast. She fell 2-3 days ago landing on the left breast. She reports being scheduled for a mammogram next week. She is maintained on continuous oxygen.  Objective:  Vital signs in last 24 hours:  Blood pressure (!) 108/50, pulse 72, temperature 98.6 F (37 C), temperature source Oral, resp. rate 16, weight 234 lb (106.1 kg), SpO2 95 %.    HEENT: Neck without mass. Lymphatics: No palpable cervical, supraclavicular, axillary or inguinal lymph nodes. Resp: Lungs clear bilaterally. Cardio: Regular rate and rhythm. GI: No organomegaly. Vascular: No leg edema. Neuro: Alert and oriented.  Skin: Multiple superficial abrasions skin beneath the left breast. Breasts: No mass palpated.    Lab Results: 07/30/2016: Sodium 137, potassium 4.2, CO2 29, BUN 44, currently 3.4, calcium 10, albumin 3.1  Medications: I have reviewed the patient's current medications.  Assessment/Plan: 1. Non-Hodgkin's lymphoma, high-grade B-cell lymphoma, involving left inguinal mass, status post core biopsy 03/21/2009. Staging PET scan on 03/16/2009 showed a left inguinal nodal conglomerate  measuring 7 x 6.3 cm with intense hypermetabolic activity with additional hypermetabolic tumor and mass-like swelling within the muscle groups extending to the left knee with a permeative appearance of the distal femoral metaphysis and femoral condyles. She completed 6 cycles of CHOP (Adriamycin was deleted and etoposide substituted) and rituximab with marked clinical and radiographic improvement. 2. Hospitalization 11/25 through 04/10/2009 with acute exacerbation of diastolic congestive heart failure, likely secondary to Lasix being held and intravenous fluids given with cycle 1 of chemotherapy. 3. Ischemic cardiomyopathy status post implantation of a biventricular ICD 08/02/2005 with improvement in the ejection fraction. 4. Insulin-dependent diabetes. 5. Hypertension. 6. Hypercholesterolemia.  7. Hypertriglyceridemia. 8. Sleep apnea on CPAP and home oxygen. 9. History of peripheral neuropathy, unchanged following chemotherapy. 10. Chronic low back pain followed by Dr. Nelva Bush. 11. Hospitalization September 2013 with acute CHF and cellulitis. 12. Admission with heart failure/ respiratory failure October 2014 13. Recent hypercalcemia, unexplained etiology. Per Dr. Elissa Hefty office note 07/18/2016 calcium level peaked at 13. Vitamin D and calcium supplements discontinued. Calcium slowly improved to 10.5. Appropriately low iPTH, negative SPEP, normal chest x-ray. She has been referred for a mammogram. Pending vitamin D levels and PTH related peptide. 14. Chronic kidney disease followed by Dr. Marval Regal   Disposition: Destiny [name] has a history of high-grade B-cell non-Hodgkin's lymphoma dating to November 2010. She completed 6 cycles of CHOP/Rituxan. Adriamycin was deleted and etoposide substituted. She remains in clinical remission from the non-Hodgkin's lymphoma.  She has had recent hypercalcemia, improved with discontinuation of vitamin D and calcium supplements. The etiology of the hypercalcemia  is unclear.  As noted above she remains in clinical remission from non-Hodgkin's lymphoma. There is no evidence of recurrent  lymphoma by history or physical exam. We will check a chemistry panel and LDH today. She is scheduled for a mammogram next week. Vitamin D and PTH related peptide labs were obtained recently. We do not have those results.  We will follow-up on the chemistry panel and LDH from today. Additional follow-up will be scheduled pending those results.  Patient seen with Dr. Benay Spice. 25 minutes were spent face-to-face at today's visit with the majority of that time involved in counseling/coordination of care.  Ned Card ANP/GNP-BC   07/30/2016  3:58 PM  This was a shared visit with Ned Card. Destiny [name] was interviewed and examined. She has a recent history of hypercalcemia. This appears to have improved with discontinuation of vitamin D and calcium supplements. There is no clinical evidence for a malignancy. No evidence of recurrent lymphoma. She is scheduled for continued follow-up with Dr. Marval Regal. I am available to see her as needed.  Julieanne Manson, M.D.

## 2016-07-30 NOTE — Telephone Encounter (Signed)
Patient sent to lab per 3/19 los. No f/u requested at this time.

## 2016-07-31 LAB — LACTATE DEHYDROGENASE: LDH: 312 U/L — AB (ref 125–245)

## 2016-08-01 ENCOUNTER — Telehealth: Payer: Self-pay | Admitting: *Deleted

## 2016-08-01 ENCOUNTER — Other Ambulatory Visit: Payer: Self-pay | Admitting: Nurse Practitioner

## 2016-08-01 NOTE — Telephone Encounter (Signed)
-----   Message from Owens Shark, NP sent at 08/01/2016  2:17 PM EDT ----- Please let Destiny [name] know the recent labs showed persistent mild hypercalcemia and elevated LDH. Dr. Benay Spice would like for her to return for repeat labs and an office visit in the next few weeks. I have placed the orders and sent a message to scheduling.

## 2016-08-01 NOTE — Telephone Encounter (Signed)
Left message in regards to lab results below- per pt ok to leave detailed messages on home number. Advised in message scheduling would be calling with an appointment for next week.

## 2016-08-02 ENCOUNTER — Ambulatory Visit (INDEPENDENT_AMBULATORY_CARE_PROVIDER_SITE_OTHER): Payer: Medicare Other | Admitting: *Deleted

## 2016-08-02 DIAGNOSIS — Z5181 Encounter for therapeutic drug level monitoring: Secondary | ICD-10-CM

## 2016-08-02 DIAGNOSIS — Z8673 Personal history of transient ischemic attack (TIA), and cerebral infarction without residual deficits: Secondary | ICD-10-CM | POA: Diagnosis not present

## 2016-08-02 DIAGNOSIS — I480 Paroxysmal atrial fibrillation: Secondary | ICD-10-CM | POA: Diagnosis not present

## 2016-08-02 DIAGNOSIS — I255 Ischemic cardiomyopathy: Secondary | ICD-10-CM

## 2016-08-02 LAB — POCT INR: INR: 1.4

## 2016-08-03 ENCOUNTER — Telehealth: Payer: Self-pay | Admitting: Oncology

## 2016-08-03 NOTE — Telephone Encounter (Signed)
Scheduled appt per sch message from Ned Card NP. Patient is aware of new time and date.

## 2016-08-06 ENCOUNTER — Ambulatory Visit (INDEPENDENT_AMBULATORY_CARE_PROVIDER_SITE_OTHER): Payer: Medicare Other

## 2016-08-06 DIAGNOSIS — Z95810 Presence of automatic (implantable) cardiac defibrillator: Secondary | ICD-10-CM

## 2016-08-06 DIAGNOSIS — I5022 Chronic systolic (congestive) heart failure: Secondary | ICD-10-CM

## 2016-08-06 NOTE — Progress Notes (Signed)
EPIC Encounter for ICM Monitoring  Patient Name: Destiny [name] is a 76 y.o. female Date: 08/06/2016 Primary Care Physican: Penni Homans, MD Primary [place] Electrophysiologist: Lovena Le Nephrologist: [place] Dry Weight:228lb  Bi-V Pacing: >99%      Heart Failure questions reviewed, pt asymptomatic.  Patient had a fall.    Thoracic impedance abnormal suggesting fluid accumulation from 08/01/2016 and back to baseline today, 08/06/2016.  Prescribed: Torsemide 20-40 mg by mouth as directed. 2 tablets daily on Friday, Saturday & Sunday. 1 tablet daily Monday through Thursday.  Labs: 07/30/2016 Creatinine 3.4, BUN 44, Potassium 4.2, Sodium 137 05/21/2016 Creatinine 4.3, BUN 58, Potassium 4.4, Sodium 134 04/27/2016 Creatinine 3.73, BUN 44, Potassium 4.8, Sodium 137, EGFR 12.62 04/23/2016 Creatinine 3.74, BUN 49, Potassium 4.8, Sodium 137, EGFR 12.59  04/20/2016 Creatinine 3.43, BUN 48, Potassium 4.7, Sodium 137, EGFR 13.91  04/19/2016 Creatinine 3.32, BUN 51, Potassium 4.6, Sodium 137, EGFR 14.44  01/09/2016 Creatinine 2.33, BUN 61, Potassium 4.2, Sodium 138  01/08/2016 Creatinine 2.42, BUN 62, Potassium 4.3, Sodium 139  01/07/2016 Creatinine 2.75, BUN 63, Potassium 4.7, Sodium 137  01/05/2016 Creatinine 2.62, BUN 65, Potassium 4.6, Sodium 136  01/04/2016 Creatinine 2.58, BUN 60, Potassium 4.6, Sodium 135  01/03/2016 Creatinine 2.47, BUN 60, Potassium 4.6, Sodium 135 01/02/2016 Creatinine 2.83, BUN 55, Potassium 4.7, Sodium 140  01/01/2016 Creatinine 2.91, BUN 56, Potassium 4.6, Sodium 137  12/17/2015 Creatinine 2.85, BUN 64, Potassium 5.0, Sodium 139  12/16/2015 Creatinine 3.16, BUN 67, Potassium 4.7, Sodium 137  12/15/2015 Creatinine 3.45, BUN 62, Potassium 5.0, Sodium 136  12/14/2015 Creatinine 3.51, BUN 62, Potassium 5.3, Sodium 133  12/05/2015 Creatinine 2.95, BUN 47, Potassium 4.0, Sodium 138  07/14/2015 Creatinine 2.65, BUN 48, Potassium 4.2, Sodium 140,  EGFR 18.77  05/27/2015 Creatinine 3.00, BUN 53, Potassium 4.3, Sodium 140  Recommendations: No changes. Reminded to limit dietary salt intake to 2000 mg/day and fluid intake to < 2 liters/day. Encouraged to call for fluid symptoms.  Follow-up plan: ICM clinic phone appointment on 09/06/2016.  Copy of ICM check sent to device physician.   3 month ICM trend: 08/06/2016   1 Year ICM trend:      [name], RN 08/06/2016 8:36 AM

## 2016-08-07 ENCOUNTER — Emergency Department (HOSPITAL_COMMUNITY): Payer: Medicare Other

## 2016-08-07 ENCOUNTER — Emergency Department (HOSPITAL_COMMUNITY)
Admission: EM | Admit: 2016-08-07 | Discharge: 2016-08-07 | Disposition: A | Discharge status: Home / Self Care | Payer: Medicare Other | Attending: Emergency Medicine | Admitting: Emergency Medicine

## 2016-08-07 ENCOUNTER — Ambulatory Visit: Payer: Medicare Other | Admitting: Oncology

## 2016-08-07 ENCOUNTER — Encounter (HOSPITAL_COMMUNITY): Payer: Self-pay

## 2016-08-07 DIAGNOSIS — Z95810 Presence of automatic (implantable) cardiac defibrillator: Secondary | ICD-10-CM | POA: Diagnosis not present

## 2016-08-07 DIAGNOSIS — S32028A Other fracture of second lumbar vertebra, initial encounter for closed fracture: Secondary | ICD-10-CM | POA: Insufficient documentation

## 2016-08-07 DIAGNOSIS — Y999 Unspecified external cause status: Secondary | ICD-10-CM | POA: Insufficient documentation

## 2016-08-07 DIAGNOSIS — I5042 Chronic combined systolic (congestive) and diastolic (congestive) heart failure: Secondary | ICD-10-CM | POA: Insufficient documentation

## 2016-08-07 DIAGNOSIS — E1140 Type 2 diabetes mellitus with diabetic neuropathy, unspecified: Secondary | ICD-10-CM | POA: Diagnosis not present

## 2016-08-07 DIAGNOSIS — Z951 Presence of aortocoronary bypass graft: Secondary | ICD-10-CM | POA: Insufficient documentation

## 2016-08-07 DIAGNOSIS — S32020A Wedge compression fracture of second lumbar vertebra, initial encounter for closed fracture: Secondary | ICD-10-CM | POA: Diagnosis not present

## 2016-08-07 DIAGNOSIS — I2510 Atherosclerotic heart disease of native coronary artery without angina pectoris: Secondary | ICD-10-CM | POA: Diagnosis not present

## 2016-08-07 DIAGNOSIS — Z7901 Long term (current) use of anticoagulants: Secondary | ICD-10-CM | POA: Insufficient documentation

## 2016-08-07 DIAGNOSIS — S0990XA Unspecified injury of head, initial encounter: Secondary | ICD-10-CM | POA: Insufficient documentation

## 2016-08-07 DIAGNOSIS — Y929 Unspecified place or not applicable: Secondary | ICD-10-CM | POA: Insufficient documentation

## 2016-08-07 DIAGNOSIS — W0110XA Fall on same level from slipping, tripping and stumbling with subsequent striking against unspecified object, initial encounter: Secondary | ICD-10-CM | POA: Insufficient documentation

## 2016-08-07 DIAGNOSIS — M25551 Pain in right hip: Secondary | ICD-10-CM | POA: Diagnosis not present

## 2016-08-07 DIAGNOSIS — M545 Low back pain: Secondary | ICD-10-CM | POA: Diagnosis not present

## 2016-08-07 DIAGNOSIS — E039 Hypothyroidism, unspecified: Secondary | ICD-10-CM | POA: Diagnosis not present

## 2016-08-07 DIAGNOSIS — J449 Chronic obstructive pulmonary disease, unspecified: Secondary | ICD-10-CM | POA: Diagnosis not present

## 2016-08-07 DIAGNOSIS — S79911A Unspecified injury of right hip, initial encounter: Secondary | ICD-10-CM | POA: Diagnosis not present

## 2016-08-07 DIAGNOSIS — I130 Hypertensive heart and chronic kidney disease with heart failure and stage 1 through stage 4 chronic kidney disease, or unspecified chronic kidney disease: Secondary | ICD-10-CM | POA: Diagnosis not present

## 2016-08-07 DIAGNOSIS — E1122 Type 2 diabetes mellitus with diabetic chronic kidney disease: Secondary | ICD-10-CM | POA: Diagnosis not present

## 2016-08-07 DIAGNOSIS — Z79899 Other long term (current) drug therapy: Secondary | ICD-10-CM | POA: Insufficient documentation

## 2016-08-07 DIAGNOSIS — N184 Chronic kidney disease, stage 4 (severe): Secondary | ICD-10-CM | POA: Insufficient documentation

## 2016-08-07 DIAGNOSIS — Z794 Long term (current) use of insulin: Secondary | ICD-10-CM | POA: Diagnosis not present

## 2016-08-07 DIAGNOSIS — S3992XA Unspecified injury of lower back, initial encounter: Secondary | ICD-10-CM | POA: Diagnosis not present

## 2016-08-07 DIAGNOSIS — Y939 Activity, unspecified: Secondary | ICD-10-CM | POA: Insufficient documentation

## 2016-08-07 MED ORDER — HYDROCODONE-ACETAMINOPHEN 5-325 MG PO TABS: 1.0000 | ORAL_TABLET | Freq: Four times a day (QID) | ORAL | 0 refills | Status: DC | PRN

## 2016-08-07 MED ORDER — HYDROCODONE-ACETAMINOPHEN 5-325 MG PO TABS
1.0000 | ORAL_TABLET | Freq: Once | ORAL | Status: AC
  Administered 2016-08-07: 1 via ORAL
  Filled 2016-08-07: qty 1

## 2016-08-07 NOTE — ED Notes (Signed)
Pt wheeled back from waiting area, assisted pt into the bed and onto monitor.

## 2016-08-07 NOTE — ED Triage Notes (Signed)
Pt states she fell on Friday morning, tripped on her oxygen tubing and hit her head. She also has right hip pain. Pt takes coumadin. Pt alert and oriented X4, states pain increases with movement.

## 2016-08-07 NOTE — Discharge Instructions (Signed)
Return here as needed.  Follow-up with your primary doctor. °

## 2016-08-07 NOTE — ED Notes (Signed)
Patient transported to CT 

## 2016-08-07 NOTE — ED Notes (Signed)
Pt back in room.  Orthostatic attempted, pt unable to stand.  No drop in b/p from lying to sitting.  Family at bedside.  Safety maintained.

## 2016-08-09 ENCOUNTER — Ambulatory Visit: Payer: Medicare Other

## 2016-08-10 NOTE — ED Provider Notes (Signed)
[facility] DEPT Provider Note   CSN: [id number] Arrival date & time: 08/07/16  1308     History   Chief Complaint Chief Complaint  Patient presents with  . Fall    HPI Destiny [name] is a 76 y.o. female.  HPI Patient presents to the emergency department with injuries from a fall that occurred this past Friday.  The patient states that she tripped over her oxygen tubing and she hit her head and also has right hip pain.  Patient states that movement and palpation makes the pain worse.  Patient states that her pain seemed to be getting worse and she had difficulty walking and does use a walker. The patient denies chest pain, shortness of breath, headache,blurred vision, neck pain, fever, cough, weakness, numbness, dizziness, anorexia, edema, abdominal pain, nausea, vomiting, diarrhea, rash, back pain, dysuria, hematemesis, bloody stool, near syncope, or syncope. Past Medical History:  Diagnosis Date  . AICD (automatic cardioverter/defibrillator) present   . Anemia, unspecified 12/13/2013  . Back pain, chronic    "just when I walk; mass on 3rd and 4th vertebrae right lower back"  . Biventricular implantable cardiac defibrillator in situ 2007, 2012   a. 2007;  b. 2012 Gen change: SJM 3231-40 Uni BiV ICD, ser # [id number].  Marland Kitchen CAD (coronary artery disease)    a. 05/2001 CABG x4: LIMA->LAD, VG->D1, VG->D2, VG->RCA.  . Cardiomyopathy, ischemic    a. 2012 s/p SJM 3231-40 Uni BiV ICD, ser # [id number].  . Carotid stenosis    a. 10/19/2011 carotid duplex - Mild hard plaque bilaterally. Stable 40-59% bilateral ICA stenosis. Carotid US (04/2013):  Bilateral 40-59% ICA.  F/u 1 year  . Cerumen impaction 10/28/2015  . Chronic combined systolic and diastolic CHF (congestive heart failure) (HCC)    a. EF as low as 25% in 2006;  b. EF 60-65% in 12/2011;  c. 02/2013 Echo: EF 35-40, mod mid-dist antsept HK, Gr 2 DD, mild LVH.  . CKD (chronic kidney disease), stage III   . COPD (chronic obstructive pulmonary  disease) ([place])   . 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 Dyslipidemia   . Gout ~ 08/2011  . Hypercalcemia 09/26/2013  . Hypertension   . Hypothyroidism   . Hypoxemia requiring supplemental oxygen   . LBBB (left bundle branch block)   . Morbid obesity with BMI of 45.0-49.9, adult (HCC)    Ht. 5'4". BMI 47.2  . Mural thrombus of left ventricle    before 2003, while on Coumadin, No h/o CVA  . Non-Hodgkin's lymphoma of inguinal region ([place]) 02/2009   mass; left; B-type; Dr. Benay Spice, in remission  . On home oxygen therapy    "normally 2-3L; 24/7" (12/14/2015)  . OSA on CPAP   . PAF (paroxysmal atrial fibrillation) (HCC)    on Coumadin  . Presence of permanent cardiac pacemaker   . Retinopathy due to secondary diabetes ([place])    type II, uncontrolled  . Urinary incontinence 06/25/2016  . Vitamin D deficiency 06/09/2012   historical    Patient Active Problem List   Diagnosis Date Noted  . Urinary incontinence 06/25/2016  . Diabetic neuropathy ([place]) 03/01/2016  . Current use of insulin ([place]) 03/01/2016  . CKD (chronic kidney disease) stage 4, GFR 15-29 ml/min (HCC) 03/01/2016  . Skin lesion of back 02/18/2016  . COPD, severe ([place]) 01/22/2016  . Obesity hypoventilation syndrome ([place]) 01/01/2016  . Abdominal distention   .  Debility 12/14/2015  . Chronic respiratory failure ([place]) 10/25/2014  . Hypoglycemia 10/25/2014  . Acute on chronic respiratory failure with hypercapnia ([place]) 09/26/2014  . Medicare annual wellness visit, subsequent 09/12/2014  . Coagulopathy ([place]) 07/29/2014  . Morbid obesity ([place]) 07/29/2014  . Controlled type 2 diabetes mellitus with chronic kidney disease ([place]) 07/28/2014  . Anemia 12/13/2013  . DOE (dyspnea on exertion) 11/30/2013  . Edema 09/26/2013  . Hypercalcemia 09/26/2013  . Encounter for therapeutic drug monitoring 06/10/2013  . Chronic systolic heart  failure ([place]) 05/01/2013  . Chronic combined systolic and diastolic CHF (congestive heart failure) ([place]) 05/01/2013  . Physical deconditioning 03/25/2013  . Hyperkalemia 03/12/2013  . Hypothyroidism 03/06/2013  . Vitamin D deficiency 06/09/2012  . History of Cardiomyopathy, ischemic   . OSA (obstructive sleep apnea) 09/05/2010  . LEG PAIN, BILATERAL 12/28/2009  . Diabetes mellitus type 2 with retinopathy ([place]) 10/28/2009  . Paroxysmal atrial fibrillation ([place]) 06/21/2009  . History of non-Hodgkin's lymphoma 04/21/2009  . MURAL THROMBUS, LEFT VENTRICLE 03/07/2009  . LYMPHEDEMA 03/07/2009  . LBBB 08/26/2008  . History of CVA (cerebrovascular accident) 08/26/2008  . Backache 08/26/2008  . Automatic implantable cardioverter-defibrillator in situ 08/26/2008  . Carotid stenosis 05/20/2007  . Hyperlipidemia 03/19/2007  . Essential hypertension 11/20/2006    Past Surgical History:  Procedure Laterality Date  . ABDOMINAL HYSTERECTOMY  1982  . CARDIAC CATHETERIZATION  06/03/01  . CARDIOVERSION N/A 07/30/2014   Procedure: CARDIOVERSION;  Surgeon: Jolaine Artist, MD;  Location: [facility] ENDOSCOPY;  Service: Cardiovascular;  Laterality: N/A;  . CARDIOVERSION N/A 11/25/2014   Procedure: CARDIOVERSION;  Surgeon: Evans Lance, MD;  Location: [facility];  Service: Cardiovascular;  Laterality: N/A;  . CATARACT EXTRACTION     left eye  . CHOLECYSTECTOMY  1970  . CORONARY ARTERY BYPASS GRAFT  2003   CABG X4  . INSERT / REPLACE / REMOVE PACEMAKER  2007; 2012   w/AICD  . REFRACTIVE SURGERY     right eye  . TEE WITHOUT CARDIOVERSION N/A 12/16/2015   Procedure: TRANSESOPHAGEAL ECHOCARDIOGRAM (TEE);  Surgeon: Jolaine Artist, MD;  Location: [facility] ENDOSCOPY;  Service: Cardiovascular;  Laterality: N/A;  . TUBAL LIGATION  1972    OB History    No data available       Home Medications    Prior to Admission medications   Medication Sig Start Date End Date Taking? Authorizing Provider    acetaminophen (TYLENOL) 500 MG tablet Take 1,000 mg by mouth 2 (two) times daily as needed for mild pain.     Historical Provider, MD  albuterol (PROVENTIL HFA;VENTOLIN HFA) 108 (90 BASE) MCG/ACT inhaler Inhale 2 puffs into the lungs every 6 (six) hours as needed for wheezing or shortness of breath. 12/26/12   Mosie Lukes, MD  amiodarone (PACERONE) 200 MG tablet Take 200 mg by mouth daily.    Historical Provider, MD  carvedilol (COREG) 25 MG tablet Take 1 tablet (25 mg total) by mouth 2 (two) times daily. 07/12/16   Jolaine Artist, MD  escitalopram (LEXAPRO) 10 MG tablet Take 10 mg by mouth daily.    Historical Provider, MD  fluticasone (FLONASE) 50 MCG/ACT nasal spray Place 2 sprays into both nostrils daily as needed for allergies or rhinitis. As needed for nasal stuffiness. 01/13/14   Mosie Lukes, MD  gabapentin (NEURONTIN) 300 MG capsule Take 300 mg by mouth at bedtime.    Historical Provider, MD  glucose blood (FREESTYLE LITE) test strip DX: 250.60  Check sugars  bid and as needed Patient taking differently: 1 each by Other route See admin instructions. Check blood sugar twice daily 05/22/13   Mosie Lukes, MD  HYDROcodone-acetaminophen (NORCO/VICODIN) 5-325 MG tablet Take 1 tablet by mouth every 6 (six) hours as needed for moderate pain. 08/07/16   Ritha Sampedro, PA-C  insulin NPH (HUMULIN N,NOVOLIN N) 100 UNIT/ML injection Inject 15-30 Units into the skin 2 (two) times daily before a meal. Take 40 units in the morning and 5 units in the evening    Historical Provider, MD  levothyroxine (SYNTHROID, LEVOTHROID) 25 MCG tablet Take 25 mcg by mouth daily before breakfast.    Historical Provider, MD  LORazepam (ATIVAN) 0.5 MG tablet Take 1 tablet (0.5 mg total) by mouth at bedtime. 04/19/16   Mosie Lukes, MD  OXYGEN Inhale 3 L into the lungs continuous.    Historical Provider, MD  torsemide (DEMADEX) 20 MG tablet Take 20-40 mg by mouth as directed. 2 tablets daily on Friday, Saturday &  Sunday.  1 tablet daily Monday through Thursday.    Historical Provider, MD  warfarin (COUMADIN) 5 MG tablet TAKE AS DIRECTED BY ANTICOAGULATION CLINIC 04/10/16   Evans Lance, MD    Family History Family History  Problem Relation Age of Onset  . Kidney cancer Mother     kidney and female repo - died @ 1  . Asthma Mother   . Cancer Mother     gyn and renal  . Heart disease Mother     CAD  . Heart disease Father     died @ 65  . Stroke Father   . Diabetes Father   . Hypertension Father   . Hyperlipidemia Father   . Heart attack Father   . Hypertension Son   . Kidney cancer Maternal Uncle   . Cancer Maternal Uncle   . Cirrhosis Maternal Grandmother     non alcohol  . Cancer Maternal Grandfather   . Kidney cancer Maternal Grandfather   . Hypertension Maternal Aunt     Social History Social History  Substance Use Topics  . Smoking status: Never Smoker  . Smokeless tobacco: Never Used  . Alcohol use No     Allergies   Sulfonamide derivatives   Review of Systems Review of Systems All other systems negative except as documented in the HPI. All pertinent positives and negatives as reviewed in the HPI.  Physical Exam Updated Vital Signs BP (!) 112/93 (BP Location: Right Arm)   Pulse 69   Temp 98.2 F (36.8 C) (Oral)   Resp 18   Ht 5' 4.5" (1.638 m)   Wt 105.7 kg   SpO2 98%   BMI 39.38 kg/m   Physical Exam  Constitutional: She is oriented to person, place, and time. She appears well-developed and well-nourished. No distress.  HENT:  Head: Normocephalic and atraumatic.  Mouth/Throat: Oropharynx is clear and moist.  Eyes: Pupils are equal, round, and reactive to light.  Neck: Normal range of motion. Neck supple.  Cardiovascular: Normal rate, regular rhythm and normal heart sounds.  Exam reveals no gallop and no friction rub.   No murmur heard. Pulmonary/Chest: Effort normal and breath sounds normal. No respiratory distress. She has no wheezes.  Abdominal:  Soft. Bowel sounds are normal. She exhibits no distension. There is no tenderness.  Musculoskeletal:       Right hip: She exhibits tenderness. She exhibits normal range of motion, normal strength, no bony tenderness, no swelling, no crepitus and no  deformity.       Lumbar back: She exhibits tenderness and pain. She exhibits no bony tenderness, no swelling, no edema, no deformity, no laceration, no spasm and normal pulse.       Back:  Neurological: She is alert and oriented to person, place, and time. She exhibits normal muscle tone. Coordination normal.  Skin: Skin is warm and dry. Capillary refill takes less than 2 seconds. No rash noted. No erythema.  Psychiatric: She has a normal mood and affect. Her behavior is normal.  Nursing note and vitals reviewed.    ED Treatments / Results  Labs (all labs ordered are listed, but only abnormal results are displayed) Labs Reviewed - No data to display  EKG  EKG Interpretation None       Radiology No results found.  Procedures Procedures (including critical care time)  Medications Ordered in ED Medications  HYDROcodone-acetaminophen (NORCO/VICODIN) 5-325 MG per tablet 1 tablet (1 tablet Oral Given 08/07/16 1423)     Initial Impression / Assessment and Plan / ED Course  I have reviewed the triage vital signs and the nursing notes.  Pertinent labs & imaging results that were available during my care of the patient were reviewed by me and considered in my medical decision making (see chart for details).     Patient has negative CT scans and will be referred back to her primary care doctor.  The patient was able to walk with assistance.  Patient does have a compression fracture of L2 with only 10% loss of height and did advise her to follow-up with her primary care doctor.  Patient agrees the plan and all questions were answered  Final Clinical Impressions(s) / ED Diagnoses   Final diagnoses:  Compression fracture of L2, closed,  initial encounter [facility])    New Prescriptions Discharge Medication List as of 08/07/2016  2:27 PM    START taking these medications   Details  HYDROcodone-acetaminophen (NORCO/VICODIN) 5-325 MG tablet Take 1 tablet by mouth every 6 (six) hours as needed for moderate pain., Starting Tue 08/07/2016, Print         [place], PA-C 08/10/16 1639    Virgel Manifold, MD 08/11/16 [phone]

## 2016-08-14 ENCOUNTER — Ambulatory Visit (INDEPENDENT_AMBULATORY_CARE_PROVIDER_SITE_OTHER): Payer: Medicare Other | Admitting: *Deleted

## 2016-08-14 DIAGNOSIS — Z8673 Personal history of transient ischemic attack (TIA), and cerebral infarction without residual deficits: Secondary | ICD-10-CM | POA: Diagnosis not present

## 2016-08-14 DIAGNOSIS — Z5181 Encounter for therapeutic drug level monitoring: Secondary | ICD-10-CM | POA: Diagnosis not present

## 2016-08-14 DIAGNOSIS — I480 Paroxysmal atrial fibrillation: Secondary | ICD-10-CM | POA: Diagnosis not present

## 2016-08-14 DIAGNOSIS — I255 Ischemic cardiomyopathy: Secondary | ICD-10-CM

## 2016-08-14 LAB — POCT INR: INR: 4

## 2016-08-20 ENCOUNTER — Ambulatory Visit (HOSPITAL_BASED_OUTPATIENT_CLINIC_OR_DEPARTMENT_OTHER): Payer: Medicare Other | Admitting: Oncology

## 2016-08-20 ENCOUNTER — Telehealth: Payer: Self-pay | Admitting: Oncology

## 2016-08-20 ENCOUNTER — Other Ambulatory Visit (HOSPITAL_BASED_OUTPATIENT_CLINIC_OR_DEPARTMENT_OTHER): Payer: Medicare Other

## 2016-08-20 VITALS — BP 119/40 | HR 72 | Temp 98.2°F | Resp 18 | Ht 64.5 in | Wt 229.0 lb

## 2016-08-20 DIAGNOSIS — M545 Low back pain: Secondary | ICD-10-CM

## 2016-08-20 DIAGNOSIS — I10 Essential (primary) hypertension: Secondary | ICD-10-CM | POA: Diagnosis not present

## 2016-08-20 DIAGNOSIS — N183 Chronic kidney disease, stage 3 (moderate): Secondary | ICD-10-CM | POA: Diagnosis not present

## 2016-08-20 DIAGNOSIS — E119 Type 2 diabetes mellitus without complications: Secondary | ICD-10-CM | POA: Diagnosis not present

## 2016-08-20 DIAGNOSIS — Z8572 Personal history of non-Hodgkin lymphomas: Secondary | ICD-10-CM | POA: Diagnosis not present

## 2016-08-20 DIAGNOSIS — Z9181 History of falling: Secondary | ICD-10-CM | POA: Diagnosis not present

## 2016-08-20 DIAGNOSIS — R740 Nonspecific elevation of levels of transaminase and lactic acid dehydrogenase [LDH]: Secondary | ICD-10-CM

## 2016-08-20 LAB — COMPREHENSIVE METABOLIC PANEL
ALBUMIN: 2.8 g/dL — AB (ref 3.5–5.0)
ALT: 25 U/L (ref 0–55)
AST: 33 U/L (ref 5–34)
Alkaline Phosphatase: 129 U/L (ref 40–150)
Anion Gap: 10 mEq/L (ref 3–11)
BUN: 44.6 mg/dL — AB (ref 7.0–26.0)
CALCIUM: 10 mg/dL (ref 8.4–10.4)
CHLORIDE: 102 meq/L (ref 98–109)
CO2: 27 mEq/L (ref 22–29)
CREATININE: 3.1 mg/dL — AB (ref 0.6–1.1)
EGFR: 14 mL/min/{1.73_m2} — ABNORMAL LOW (ref >90)
Glucose: 166 mg/dl — ABNORMAL HIGH (ref 70–140)
Potassium: 4.4 mEq/L (ref 3.5–5.1)
Sodium: 139 mEq/L (ref 136–145)
TOTAL PROTEIN: 5.7 g/dL — AB (ref 6.4–8.3)
Total Bilirubin: 0.82 mg/dL (ref 0.20–1.20)

## 2016-08-20 LAB — LACTATE DEHYDROGENASE: LDH: 307 U/L — AB (ref 125–245)

## 2016-08-20 MED ORDER — OXYCODONE-ACETAMINOPHEN 5-325 MG PO TABS
1.0000 | ORAL_TABLET | ORAL | 0 refills | Status: DC | PRN
Start: 2016-08-20 — End: 2016-09-06

## 2016-08-20 MED ORDER — OXYCODONE-ACETAMINOPHEN 5-325 MG PO TABS: 1.0000 | ORAL_TABLET | ORAL | 0 refills | Status: DC | PRN

## 2016-08-20 NOTE — Progress Notes (Signed)
[facility] OFFICE PROGRESS NOTE   Diagnosis: Non-Hodgkin's lymphoma  INTERVAL HISTORY:   Destiny [name] returns for scheduled visit. She fell on 08/07/2016 and landed on her back. She reports severe low back pain since the fall. She takes hydrocodone, but this does not relieve the pain. She was seen in the emergency room on 08/07/2016. A CT of the brain revealed no acute change. A CT of the lumbar spine revealed an L2 compression fracture. She reports Dr. Marval Regal has referred her for placement of a dialysis access. She is not sure whether she would agree to dialysis and plans to cancel this procedure. No fever, night sweats, or anorexia.  Objective:  Vital signs in last 24 hours:  Blood pressure (!) 119/40, pulse 72, temperature 98.2 F (36.8 C), temperature source Oral, resp. rate 18, height 5' 4.5" (1.638 m), weight 229 lb (103.9 kg), SpO2 98 %.    HEENT: Neck without mass Lymphatics: No cervical or supra-clavicular nodes Resp: Clear bilaterally Cardio: Regular rate and rhythm GI: Mild tenderness in the left upper abdomen Vascular: No leg edema Musculoskeletal: There is tenderness at the upper sacrum and lower lumbar spine.   Portacath/PICC-without erythema  Lab Results:  BUN 44.6, creatinine 3.1, potassium 4.4, calcium 10, albumin 2.8 [id number] Medications: I have reviewed the patient's current medications.  Assessment/Plan: 1. Non-Hodgkin's lymphoma, high-grade B-cell lymphoma, involving left inguinal mass, status post core biopsy 03/21/2009. Staging PET scan on 03/16/2009 showed a left inguinal nodal conglomerate measuring 7 x 6.3 cm with intense hypermetabolic activity with additional hypermetabolic tumor and mass-like swelling within the muscle groups extending to the left knee with a permeative appearance of the distal femoral metaphysis and femoral condyles. She completed 6 cycles of CHOP (Adriamycin was deleted and etoposide substituted) and rituximab  with marked clinical and radiographic improvement. 2. Hospitalization 11/25 through 04/10/2009 with acute exacerbation of diastolic congestive heart failure, likely secondary to Lasix being held and intravenous fluids given with cycle 1 of chemotherapy. 3. Ischemic cardiomyopathy status post implantation of a biventricular ICD 08/02/2005 with improvement in the ejection fraction. 4. Insulin-dependent diabetes. 5. Hypertension. 6. Hypercholesterolemia.  7. Hypertriglyceridemia. 8. Sleep apnea on CPAP and home oxygen. 9. History of peripheral neuropathy, unchanged following chemotherapy. 10. Chronic low back pain followed by Dr. Nelva Bush. 11. Hospitalization September 2013 with acute CHF and cellulitis. 12. Admission with heart failure/ respiratory failure October 2014 13. Recent hypercalcemia, unexplained etiology. Per Dr. Elissa Hefty office note 07/18/2016 calcium level peaked at 13. Vitamin D and calcium supplements discontinued. Calcium slowly improved to 10.5. Appropriately low iPTH, negative SPEP, normal chest x-ray. She has been referred for a mammogram. Pending vitamin D levels and PTH related peptide. 14. Chronic kidney disease followed by Dr. Marval Regal 15. Fall 08/07/2016-L2 compression fracture noted on CT    Disposition:  Destiny [name] remains in clinical remission from non-Hodgkin's lymphoma. The calcium is stable. I have a low clinical suspicion for progression of the lymphoma. The mild elevation of the LDH is nonspecific in this patient with multiple comorbid conditions.  She has severe pain at the lower back related to a recent fall. The pain may be related to the L2 fracture or a sacral fracture. I gave her a prescription for oxycodone to use as needed for pain. She will follow-up with Dr.Blyth if the pain persists.  She will return for an office and lab visit in 2 months.   She is considering whether she will agree to dialysis if needed in the future.  Betsy Coder,  MD  08/20/2016  3:14 PM

## 2016-08-20 NOTE — Telephone Encounter (Signed)
Gave patient avs report and appointments for June.  °

## 2016-08-21 ENCOUNTER — Telehealth: Payer: Self-pay | Admitting: *Deleted

## 2016-08-21 NOTE — Telephone Encounter (Signed)
Labs routed electronically to Dr. Marval Regal.

## 2016-08-23 ENCOUNTER — Ambulatory Visit: Payer: Medicare Other

## 2016-08-27 ENCOUNTER — Ambulatory Visit (INDEPENDENT_AMBULATORY_CARE_PROVIDER_SITE_OTHER): Payer: Medicare Other

## 2016-08-27 ENCOUNTER — Ambulatory Visit (INDEPENDENT_AMBULATORY_CARE_PROVIDER_SITE_OTHER): Payer: Medicare Other | Admitting: Family Medicine

## 2016-08-27 ENCOUNTER — Encounter: Payer: Self-pay | Admitting: Family Medicine

## 2016-08-27 VITALS — BP 104/47 | HR 70 | Temp 98.0°F | Resp 18 | Wt 230.0 lb

## 2016-08-27 DIAGNOSIS — Z794 Long term (current) use of insulin: Secondary | ICD-10-CM

## 2016-08-27 DIAGNOSIS — R5381 Other malaise: Secondary | ICD-10-CM

## 2016-08-27 DIAGNOSIS — E162 Hypoglycemia, unspecified: Secondary | ICD-10-CM | POA: Diagnosis not present

## 2016-08-27 DIAGNOSIS — Z5181 Encounter for therapeutic drug level monitoring: Secondary | ICD-10-CM | POA: Diagnosis not present

## 2016-08-27 DIAGNOSIS — Z8673 Personal history of transient ischemic attack (TIA), and cerebral infarction without residual deficits: Secondary | ICD-10-CM

## 2016-08-27 DIAGNOSIS — J449 Chronic obstructive pulmonary disease, unspecified: Secondary | ICD-10-CM

## 2016-08-27 DIAGNOSIS — E782 Mixed hyperlipidemia: Secondary | ICD-10-CM

## 2016-08-27 DIAGNOSIS — E11319 Type 2 diabetes mellitus with unspecified diabetic retinopathy without macular edema: Secondary | ICD-10-CM | POA: Diagnosis not present

## 2016-08-27 DIAGNOSIS — I255 Ischemic cardiomyopathy: Secondary | ICD-10-CM | POA: Diagnosis not present

## 2016-08-27 DIAGNOSIS — I10 Essential (primary) hypertension: Secondary | ICD-10-CM

## 2016-08-27 DIAGNOSIS — M8008XA Age-related osteoporosis with current pathological fracture, vertebra(e), initial encounter for fracture: Secondary | ICD-10-CM

## 2016-08-27 DIAGNOSIS — E559 Vitamin D deficiency, unspecified: Secondary | ICD-10-CM

## 2016-08-27 DIAGNOSIS — I480 Paroxysmal atrial fibrillation: Secondary | ICD-10-CM

## 2016-08-27 DIAGNOSIS — E039 Hypothyroidism, unspecified: Secondary | ICD-10-CM | POA: Diagnosis not present

## 2016-08-27 DIAGNOSIS — S32029G Unspecified fracture of second lumbar vertebra, subsequent encounter for fracture with delayed healing: Secondary | ICD-10-CM

## 2016-08-27 HISTORY — DX: Age-related osteoporosis with current pathological fracture, vertebra(e), initial encounter for fracture: M80.08XA

## 2016-08-27 LAB — CBC
HCT: 32.6 % — ABNORMAL LOW (ref 36.0–46.0)
HEMOGLOBIN: 11 g/dL — AB (ref 12.0–15.0)
MCHC: 33.9 g/dL (ref 30.0–36.0)
MCV: 93.5 fl (ref 78.0–100.0)
Platelets: 70 10*3/uL — ABNORMAL LOW (ref 150.0–400.0)
RBC: 3.48 Mil/uL — ABNORMAL LOW (ref 3.87–5.11)
RDW: 16.3 % — AB (ref 11.5–15.5)
WBC: 3 10*3/uL — ABNORMAL LOW (ref 4.0–10.5)

## 2016-08-27 LAB — COMPREHENSIVE METABOLIC PANEL
ALK PHOS: 128 U/L — AB (ref 39–117)
ALT: 15 U/L (ref 0–35)
AST: 28 U/L (ref 0–37)
Albumin: 3.1 g/dL — ABNORMAL LOW (ref 3.5–5.2)
BILIRUBIN TOTAL: 0.9 mg/dL (ref 0.2–1.2)
BUN: 42 mg/dL — AB (ref 6–23)
CO2: 31 meq/L (ref 19–32)
CREATININE: 3.02 mg/dL — AB (ref 0.40–1.20)
Calcium: 10.1 mg/dL (ref 8.4–10.5)
Chloride: 97 mEq/L (ref 96–112)
GFR: 16.09 mL/min — AB (ref >60.00)
GLUCOSE: 254 mg/dL — AB (ref 70–99)
Potassium: 4.7 mEq/L (ref 3.5–5.1)
SODIUM: 134 meq/L — AB (ref 135–145)
TOTAL PROTEIN: 6 g/dL (ref 6.0–8.3)

## 2016-08-27 LAB — POCT INR: INR: 3.4

## 2016-08-27 LAB — LDL CHOLESTEROL, DIRECT: Direct LDL: 93 mg/dL

## 2016-08-27 LAB — HEMOGLOBIN A1C: HEMOGLOBIN A1C: 6.2 % (ref 4.6–6.5)

## 2016-08-27 LAB — LIPID PANEL
Cholesterol: 163 mg/dL (ref 0–200)
HDL: 21.3 mg/dL — AB (ref >39.00)
NonHDL: 141.98
Total CHOL/HDL Ratio: 8
Triglycerides: 245 mg/dL — ABNORMAL HIGH (ref 0.0–149.0)
VLDL: 49 mg/dL — ABNORMAL HIGH (ref 0.0–40.0)

## 2016-08-27 LAB — TSH: TSH: 7.98 u[IU]/mL — AB (ref 0.35–4.50)

## 2016-08-27 NOTE — Assessment & Plan Note (Signed)
Largely immobile but prior to Vertebral fracture she was able to get around her home with walker now she needs significant assistance to function

## 2016-08-27 NOTE — Assessment & Plan Note (Signed)
[place] roughly 3 weeks ago and has had persistent 8 to 10 of 10 pain since then. Gets marginal relief from Percocet and Lidocaine patches. Due to persistent pain will refer to ortho for consideration of kyphoplasty at this time.

## 2016-08-27 NOTE — Assessment & Plan Note (Signed)
Check level today 

## 2016-08-27 NOTE — Patient Instructions (Addendum)
  No more than 3,000mg  of tylenol in a 24 hour period Tailbone Injury The tailbone (coccyx) is the small bone at the lower end of the spine. A tailbone injury may involve stretched ligaments, bruising, or a broken bone (fracture). Tailbone injuries can be painful, and some may take a long time to heal. What are the causes? This condition is often caused by falling and landing on the tailbone. Other causes include:  Repeated strain or friction from actions such as rowing and bicycling.  Childbirth. In some cases, the cause may not be known. What increases the risk? This condition is more common in women than in men. What are the signs or symptoms? Symptoms of this condition include:  Pain in the lower back, especially when sitting.  Pain or difficulty when standing up from a sitting position.  Bruising in the tailbone area.  Painful bowel movements.  In women, pain during intercourse. How is this diagnosed? This condition may be diagnosed based on your symptoms and a physical exam. X-rays may be taken if a fracture is suspected. You may also have other tests, such as a CT scan or MRI. How is this treated? This condition may be treated with medicines to help relieve your pain. Most tailbone injuries heal on their own in 4-6 weeks. However, recovery time may be longer if the injury involves a fracture. Follow these instructions at home:  Take medicines only as directed by your health care provider.  If directed, apply ice to the injured area:  Put ice in a plastic bag.  Place a towel between your skin and the bag.  Leave the ice on for 20 minutes, 2-3 times per day for the first 1-2 days.  Sit on a large, rubber or inflated ring or cushion to ease your pain. Lean forward when you are sitting to help decrease discomfort.  Avoid sitting for long periods of time.  Increase your activity as the pain allows. Perform any exercises that are recommended by your health care provider  or physical therapist.  If you have pain during bowel movements, use stool softeners as directed by your health care provider.  Eat a diet that includes plenty of fiber to help prevent constipation.  Keep all follow-up visits as directed by your health care provider. This is important. How is this prevented? Wear appropriate padding and sports gear when bicycling and rowing. This can help to prevent developing an injury that is caused by repeated strain or friction. Contact a health care provider if:  Your pain becomes worse.  Your bowel movements cause a great deal of discomfort.  You are unable to have a bowel movement.  You have uncontrolled urine loss (urinary incontinence).  You have a fever. This information is not intended to replace advice given to you by your health care provider. Make sure you discuss any questions you have with your health care provider. Document Released: 04/27/2000 Document Revised: 12/29/2015 Document Reviewed: 04/26/2014 Elsevier Interactive Patient Education  2017 [employer].

## 2016-08-27 NOTE — Assessment & Plan Note (Signed)
On Levothyroxine, continue to monitor 

## 2016-08-27 NOTE — Assessment & Plan Note (Signed)
hgba1c acceptable, minimize simple carbs. Increase exercise as tolerated. Continue current meds 

## 2016-08-27 NOTE — Assessment & Plan Note (Signed)
Well controlled, no changes to meds. Encouraged heart healthy diet such as the DASH diet and exercise as tolerated.  °

## 2016-08-27 NOTE — Assessment & Plan Note (Signed)
Using noninvasive respirator at home with good rest and results

## 2016-08-27 NOTE — Progress Notes (Signed)
Subjective:  I acted as a Education administrator for Dr. Charlett Blake. [place], [place]   [name], female    DOB: [birth year], 76 y.o.   MRN: [id number]  Chief Complaint  Patient presents with  . Follow-up  . Diabetes  . Hyperlipidemia    Diabetes  She presents for her follow-up diabetic visit. She has type 2 diabetes mellitus. Pertinent negatives for hypoglycemia include no headaches. Associated symptoms include weakness. Pertinent negatives for diabetes include no blurred vision and no chest pain.  Hyperlipidemia  Pertinent negatives include no chest pain or shortness of breath.    Patient is in today for a 2 month follow up on diabetes, hyperlipidemia and other medical concerns. Patient states that she is in severe pain. Patient fell and has a L2 fx. Will refer to orthopedic doctor. Patient states she is taking Percocet with some relief. Pain scale is 10/10. Patient fell several times in the past month but her last follow-up was roughly 3 weeks ago and the pain started at that time and has not dissipated. She has trouble maintaining a comfortable position. She denies any new bowel or bladder incontinence. No significant complaints of radiculopathy but her ADLs have become much more difficult. She has trouble arising from a sitting position. She is highly immobile but prior to this injury was able to get up and use a walker throughout her house to get to the restroom etc. now needs significant help. No other new or acute complaints. Initially her sugars increased with her pain but have now improved once again. She continues to do well with her respirator qhs and has not had any further hospitalizations as a result  Patient Care Team: Mosie Lukes, MD as PCP - General (Family Medicine) Donato Heinz, MD as Consulting Physician (Nephrology) Evans Lance, MD as Consulting Physician (Cardiology) Larey Dresser, MD as Consulting Physician (Cardiology) Jacelyn Pi, MD as Consulting  Physician (Endocrinology) Rigoberto Noel, MD as Consulting Physician (Pulmonary Disease)   Past Medical History:  Diagnosis Date  . AICD (automatic cardioverter/defibrillator) present   . Anemia, unspecified 12/13/2013  . Back pain, chronic    "just when I walk; mass on 3rd and 4th vertebrae right lower back"  . Biventricular implantable cardiac defibrillator in situ 2007, 2012   a. 2007;  b. 2012 Gen change: SJM 3231-40 Uni BiV ICD, ser # [id number].  Marland Kitchen CAD (coronary artery disease)    a. 05/2001 CABG x4: LIMA->LAD, VG->D1, VG->D2, VG->RCA.  . Cardiomyopathy, ischemic    a. 2012 s/p SJM 3231-40 Uni BiV ICD, ser # [id number].  . Carotid stenosis    a. 10/19/2011 carotid duplex - Mild hard plaque bilaterally. Stable 40-59% bilateral ICA stenosis. Carotid US (04/2013):  Bilateral 40-59% ICA.  F/u 1 year  . Cerumen impaction 10/28/2015  . Chronic combined systolic and diastolic CHF (congestive heart failure) (HCC)    a. EF as low as 25% in 2006;  b. EF 60-65% in 12/2011;  c. 02/2013 Echo: EF 35-40, mod mid-dist antsept HK, Gr 2 DD, mild LVH.  . CKD (chronic kidney disease), stage III   . COPD (chronic obstructive pulmonary disease) ([place])   . 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 Dyslipidemia   . Gout ~ 08/2011  . Hypercalcemia 09/26/2013  . Hypertension   . Hypothyroidism   . Hypoxemia requiring supplemental oxygen   . LBBB (left bundle  branch block)   . Morbid obesity with BMI of 45.0-49.9, adult (HCC)    Ht. 5'4". BMI 47.2  . Mural thrombus of left ventricle    before 2003, while on Coumadin, No h/o CVA  . 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 Benay Spice, in remission  . On home oxygen therapy    "normally 2-3L; 24/7" (12/14/2015)  . OSA on CPAP   . PAF (paroxysmal atrial fibrillation) (HCC)    on Coumadin  . Presence of permanent cardiac pacemaker   .  Retinopathy due to secondary diabetes ([place])    type II, uncontrolled  . Urinary incontinence 06/25/2016  . Vertebral fracture, osteoporotic ([place]) 08/27/2016  . Vitamin D deficiency 06/09/2012   historical    Past Surgical History:  Procedure Laterality Date  . ABDOMINAL HYSTERECTOMY  1982  . CARDIAC CATHETERIZATION  06/03/01  . CARDIOVERSION N/A 07/30/2014   Procedure: CARDIOVERSION;  Surgeon: Jolaine Artist, MD;  Location[facility] ENDOSCOPY;  Service: Cardiovascular;  Laterality: N/A;  . CARDIOVERSION N/A 11/25/2014   Procedure: CARDIOVERSION;  Surgeon: Evans Lance, MD;  Location: [place];  Service: Cardiovascular;  Laterality: N/A;  . CATARACT EXTRACTION     left eye  . CHOLECYSTECTOMY  1970  . CORONARY ARTERY BYPASS GRAFT  2003   CABG X4  . INSERT / REPLACE / REMOVE PACEMAKER  2007; 2012   w/AICD  . REFRACTIVE SURGERY     right eye  . TEE WITHOUT CARDIOVERSION N/A 12/16/2015   Procedure: TRANSESOPHAGEAL ECHOCARDIOGRAM (TEE);  Surgeon: Jolaine Artist, MD;  Location: [facility] ENDOSCOPY;  Service: Cardiovascular;  Laterality: N/A;  . TUBAL LIGATION  1972    Family History  Problem Relation Age of Onset  . Kidney cancer Mother     kidney and female repo - died @ 100  . Asthma Mother   . Cancer Mother     gyn and renal  . Heart disease Mother     CAD  . Heart disease Father     died @ 79  . Stroke Father   . Diabetes Father   . Hypertension Father   . Hyperlipidemia Father   . Heart attack Father   . Hypertension Son   . Kidney cancer Maternal Uncle   . Cancer Maternal Uncle   . Cirrhosis Maternal Grandmother     non alcohol  . Cancer Maternal Grandfather   . Kidney cancer Maternal Grandfather   . Hypertension Maternal Aunt     Social History   Social History  . Marital status: Married    Spouse name: N/A  . Number of children: Y  . Years of education: N/A   Occupational History  . 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    Social History Main Topics    . Smoking status: Never Smoker  . Smokeless tobacco: Never Used  . Alcohol use No  . Drug use: No  . Sexual activity: Not Currently     Comment: lives with husband, no major dietary restrictions   Other Topics Concern  . Not on file   Social History Narrative   Lives in [place] with her husband.  She does not routinely exercise or adhere to any particular diet.      Outpatient Medications Prior to Visit  Medication Sig Dispense Refill  . acetaminophen (TYLENOL) 500 MG tablet Take 1,000 mg by mouth 2 (two) times daily as needed for mild pain.     Marland Kitchen  albuterol (PROVENTIL HFA;VENTOLIN HFA) 108 (90 BASE) MCG/ACT inhaler Inhale 2 puffs into the lungs every 6 (six) hours as needed for wheezing or shortness of breath. 1 Inhaler 1  . amiodarone (PACERONE) 200 MG tablet Take 200 mg by mouth daily.    . carvedilol (COREG) 25 MG tablet Take 1 tablet (25 mg total) by mouth 2 (two) times daily. 360 tablet 3  . escitalopram (LEXAPRO) 10 MG tablet Take 10 mg by mouth daily.    . fluticasone (FLONASE) 50 MCG/ACT nasal spray Place 2 sprays into both nostrils daily as needed for allergies or rhinitis. As needed for nasal stuffiness. 16 g 0  . gabapentin (NEURONTIN) 300 MG capsule Take 300 mg by mouth at bedtime.    Marland Kitchen glucose blood (FREESTYLE LITE) test strip DX: 250.60  Check sugars bid and as needed (Patient taking differently: 1 each by Other route See admin instructions. Check blood sugar twice daily) 100 each 2  . insulin NPH (HUMULIN N,NOVOLIN N) 100 UNIT/ML injection Inject 15-30 Units into the skin 2 (two) times daily before a meal. Take 40 units in the morning and 5 units in the evening    . levothyroxine (SYNTHROID, LEVOTHROID) 25 MCG tablet Take 25 mcg by mouth daily before breakfast.    . LORazepam (ATIVAN) 0.5 MG tablet Take 1 tablet (0.5 mg total) by mouth at bedtime. 90 tablet 1  . oxyCODONE-acetaminophen (PERCOCET/ROXICET) 5-325 MG tablet Take 1 tablet by mouth every 4 (four) hours as  needed for severe pain. 30 tablet 0  . OXYGEN Inhale 3 L into the lungs continuous.    . torsemide (DEMADEX) 20 MG tablet Take 20-40 mg by mouth as directed. 2 tablets daily on Friday, Saturday & Sunday.  1 tablet daily Monday through Thursday.    . warfarin (COUMADIN) 5 MG tablet TAKE AS DIRECTED BY ANTICOAGULATION CLINIC 70 tablet 1  . HYDROcodone-acetaminophen (NORCO/VICODIN) 5-325 MG tablet Take 1 tablet by mouth every 6 (six) hours as needed for moderate pain. 15 tablet 0   No facility-administered medications prior to visit.     Allergies  Allergen Reactions  . Sulfonamide Derivatives Other (See Comments)    Unknown; "childhood allergy/mother"    Review of Systems  Constitutional: Positive for malaise/fatigue. Negative for fever.  HENT: Negative for congestion.   Eyes: Negative for blurred vision.  Respiratory: Negative for cough and shortness of breath.   Cardiovascular: Negative for chest pain, palpitations and leg swelling.  Gastrointestinal: Negative for vomiting.  Musculoskeletal: Positive for back pain.  Skin: Negative for rash.  Neurological: Positive for weakness. Negative for loss of consciousness and headaches.       Objective:    Physical Exam  Constitutional: She is oriented to person, place, and time. She appears well-developed and well-nourished. No distress.  HENT:  Head: Normocephalic and atraumatic.  Eyes: Conjunctivae are normal.  Neck: Normal range of motion. No thyromegaly present.  Cardiovascular:  Murmur heard. 2/6 Diastolic murmur  Pulmonary/Chest: Effort normal and breath sounds normal. She has no wheezes.  Abdominal: Soft. Bowel sounds are normal. There is no tenderness.  Musculoskeletal: Normal range of motion. She exhibits no edema or deformity.  Neurological: She is alert and oriented to person, place, and time.  Skin: Skin is warm and dry. She is not diaphoretic.  Psychiatric: She has a normal mood and affect.    BP (!) 104/47 (BP  Location: Left Arm, Patient Position: Sitting, Cuff Size: Normal)   Pulse 70   Temp 98 F (  36.7 C) (Oral)   Resp 18   Wt 230 lb (104.3 kg)   SpO2 96%   BMI 38.87 kg/m  Wt Readings from Last 3 Encounters:  08/27/16 230 lb (104.3 kg)  08/20/16 229 lb (103.9 kg)  08/07/16 233 lb (105.7 kg)   BP Readings from Last 3 Encounters:  08/27/16 (!) 104/47  08/20/16 (!) 119/40  08/07/16 (!) 112/93     Immunization History  Administered Date(s) Administered  . H1N1 05/17/2008  . Influenza Split 01/29/2012  . Influenza Whole 02/03/2007, 02/11/2009, 01/17/2010, 01/26/2013  . Influenza,inj,Quad PF,36+ Mos 01/25/2016  . Influenza-Unspecified 01/12/2014, 02/13/2015  . Pneumococcal Conjugate-13 03/03/2015  . Pneumococcal Polysaccharide-23 12/23/2007, 03/11/2013  . Td 09/29/2001, 01/28/2012  . Tdap 10/01/2011  . Zoster 08/19/2007    Health Maintenance  Topic Date Due  . HEMOGLOBIN A1C  06/15/2016  . OPHTHALMOLOGY EXAM  08/24/2016  . MAMMOGRAM  01/11/2017 (Originally 01/03/2013)  . DEXA SCAN  01/11/2017 (Originally 09/20/2007)  . URINE MICROALBUMIN  06/25/2017 (Originally 03/27/2012)  . INFLUENZA VACCINE  12/12/2016  . FOOT EXAM  06/25/2017  . COLONOSCOPY  04/02/2021  . TETANUS/TDAP  01/27/2022  . PNA vac Low Risk Adult  Completed    Lab Results  Component Value Date   WBC 4.4 04/27/2016   HGB 10.6 (L) 04/27/2016   HCT 31.7 (L) 04/27/2016   PLT 135.0 (L) 04/27/2016   GLUCOSE 166 (H) 08/20/2016   CHOL 131 04/19/2016   TRIG 184.0 (H) 04/19/2016   HDL 29.80 (L) 04/19/2016   LDLDIRECT 64.8 05/11/2014   LDLCALC 65 04/19/2016   ALT 25 08/20/2016   AST 33 08/20/2016   NA 139 08/20/2016   K 4.4 08/20/2016   CL 96 04/27/2016   CREATININE 3.1 (HH) 08/20/2016   BUN 44.6 (H) 08/20/2016   CO2 27 08/20/2016   TSH 4.270 12/15/2015   INR 4.0 08/14/2016   HGBA1C 6.6 (H) 12/14/2015   MICROALBUR 77.0 (H) 03/28/2011    Lab Results  Component Value Date   TSH 4.270 12/15/2015   Lab  Results  Component Value Date   WBC 4.4 04/27/2016   HGB 10.6 (L) 04/27/2016   HCT 31.7 (L) 04/27/2016   MCV 94.8 04/27/2016   PLT 135.0 (L) 04/27/2016   Lab Results  Component Value Date   NA 139 08/20/2016   K 4.4 08/20/2016   CHLORIDE 102 08/20/2016   CO2 27 08/20/2016   GLUCOSE 166 (H) 08/20/2016   BUN 44.6 (H) 08/20/2016   CREATININE 3.1 (HH) 08/20/2016   BILITOT 0.82 08/20/2016   ALKPHOS 129 08/20/2016   AST 33 08/20/2016   ALT 25 08/20/2016   PROT 5.7 (L) 08/20/2016   ALBUMIN 2.8 (L) 08/20/2016   CALCIUM 10.0 08/20/2016   ANIONGAP 10 08/20/2016   EGFR 14 (L) 08/20/2016   GFR 12.62 (LL) 04/27/2016   Lab Results  Component Value Date   CHOL 131 04/19/2016   Lab Results  Component Value Date   HDL 29.80 (L) 04/19/2016   Lab Results  Component Value Date   LDLCALC 65 04/19/2016   Lab Results  Component Value Date   TRIG 184.0 (H) 04/19/2016   Lab Results  Component Value Date   CHOLHDL 4 04/19/2016   Lab Results  Component Value Date   HGBA1C 6.6 (H) 12/14/2015         Assessment & Plan:   Problem List Items Addressed This Visit    Diabetes mellitus type 2 with retinopathy ([place])    hgba1c acceptable,  minimize simple carbs. Increase exercise as tolerated. Continue current meds      Hyperlipidemia   Relevant Orders   Lipid panel   Essential hypertension - Primary    Well controlled, no changes to meds. Encouraged heart healthy diet such as the DASH diet and exercise as tolerated.       Relevant Orders   CBC   Comprehensive metabolic panel   TSH   Vitamin D deficiency    Check level today      Relevant Orders   Vitamin D (25 hydroxy)   Hypothyroidism    On Levothyroxine, continue to monitor      RESOLVED: Hypoglycemia   Relevant Orders   Hemoglobin A1c   Debility    Largely immobile but prior to Vertebral fracture she was able to get around her home with walker now she needs significant assistance to function      COPD, severe  ([place])    Using noninvasive respirator at home with good rest and results      Vertebral fracture, osteoporotic ([place])    Fell roughly 3 weeks ago and has had persistent 8 to 10 of 10 pain since then. Gets marginal relief from Percocet and Lidocaine patches. Due to persistent pain will refer to ortho for consideration of kyphoplasty at this time.         I have discontinued Ms. Stieg's HYDROcodone-acetaminophen. I am also having her maintain her albuterol, insulin NPH Human, glucose blood, fluticasone, acetaminophen, OXYGEN, amiodarone, warfarin, LORazepam, levothyroxine, gabapentin, escitalopram, torsemide, carvedilol, and oxyCODONE-acetaminophen.  No orders of the defined types were placed in this encounter.   CMA served as Education administrator during this visit. History, Physical and Plan performed by medical provider. Documentation and orders reviewed and attested to.  Penni Homans, MD

## 2016-08-27 NOTE — Progress Notes (Signed)
Pre visit review using our clinic review tool, if applicable. No additional management support is needed unless otherwise documented below in the visit note. 

## 2016-08-28 ENCOUNTER — Other Ambulatory Visit: Payer: Self-pay | Admitting: Family Medicine

## 2016-08-28 LAB — VITAMIN D 25 HYDROXY (VIT D DEFICIENCY, FRACTURES): VITD: 29.36 ng/mL — ABNORMAL LOW (ref 30.00–100.00)

## 2016-08-29 ENCOUNTER — Other Ambulatory Visit (HOSPITAL_COMMUNITY): Payer: Self-pay | Admitting: Interventional Radiology

## 2016-09-04 ENCOUNTER — Emergency Department (HOSPITAL_COMMUNITY): Payer: Medicare Other

## 2016-09-04 ENCOUNTER — Telehealth: Payer: Self-pay | Admitting: *Deleted

## 2016-09-04 ENCOUNTER — Inpatient Hospital Stay (HOSPITAL_COMMUNITY)
Admission: EM | Admit: 2016-09-04 | Discharge: 2016-09-06 | DRG: 543 | Disposition: A | Discharge status: Home Health | Payer: Medicare Other | Attending: Nephrology | Admitting: Nephrology

## 2016-09-04 ENCOUNTER — Encounter (HOSPITAL_COMMUNITY): Payer: Self-pay | Admitting: Emergency Medicine

## 2016-09-04 DIAGNOSIS — S32020A Wedge compression fracture of second lumbar vertebra, initial encounter for closed fracture: Secondary | ICD-10-CM

## 2016-09-04 DIAGNOSIS — J9612 Chronic respiratory failure with hypercapnia: Secondary | ICD-10-CM | POA: Diagnosis not present

## 2016-09-04 DIAGNOSIS — Z6841 Body Mass Index (BMI) 40.0 and over, adult: Secondary | ICD-10-CM | POA: Diagnosis not present

## 2016-09-04 DIAGNOSIS — N3000 Acute cystitis without hematuria: Secondary | ICD-10-CM | POA: Diagnosis present

## 2016-09-04 DIAGNOSIS — S32020G Wedge compression fracture of second lumbar vertebra, subsequent encounter for fracture with delayed healing: Secondary | ICD-10-CM | POA: Diagnosis not present

## 2016-09-04 DIAGNOSIS — M533 Sacrococcygeal disorders, not elsewhere classified: Secondary | ICD-10-CM | POA: Diagnosis not present

## 2016-09-04 DIAGNOSIS — J9611 Chronic respiratory failure with hypoxia: Secondary | ICD-10-CM | POA: Diagnosis not present

## 2016-09-04 DIAGNOSIS — I2510 Atherosclerotic heart disease of native coronary artery without angina pectoris: Secondary | ICD-10-CM | POA: Diagnosis present

## 2016-09-04 DIAGNOSIS — M545 Low back pain: Secondary | ICD-10-CM | POA: Diagnosis not present

## 2016-09-04 DIAGNOSIS — I480 Paroxysmal atrial fibrillation: Secondary | ICD-10-CM | POA: Diagnosis present

## 2016-09-04 DIAGNOSIS — M109 Gout, unspecified: Secondary | ICD-10-CM | POA: Diagnosis present

## 2016-09-04 DIAGNOSIS — Z9049 Acquired absence of other specified parts of digestive tract: Secondary | ICD-10-CM

## 2016-09-04 DIAGNOSIS — Z8249 Family history of ischemic heart disease and other diseases of the circulatory system: Secondary | ICD-10-CM

## 2016-09-04 DIAGNOSIS — Z90710 Acquired absence of both cervix and uterus: Secondary | ICD-10-CM

## 2016-09-04 DIAGNOSIS — S3993XA Unspecified injury of pelvis, initial encounter: Secondary | ICD-10-CM | POA: Diagnosis not present

## 2016-09-04 DIAGNOSIS — I5042 Chronic combined systolic (congestive) and diastolic (congestive) heart failure: Secondary | ICD-10-CM | POA: Diagnosis not present

## 2016-09-04 DIAGNOSIS — I482 Chronic atrial fibrillation: Secondary | ICD-10-CM | POA: Diagnosis not present

## 2016-09-04 DIAGNOSIS — D696 Thrombocytopenia, unspecified: Secondary | ICD-10-CM | POA: Diagnosis present

## 2016-09-04 DIAGNOSIS — Z7901 Long term (current) use of anticoagulants: Secondary | ICD-10-CM

## 2016-09-04 DIAGNOSIS — Z79899 Other long term (current) drug therapy: Secondary | ICD-10-CM

## 2016-09-04 DIAGNOSIS — Z951 Presence of aortocoronary bypass graft: Secondary | ICD-10-CM

## 2016-09-04 DIAGNOSIS — M4854XA Collapsed vertebra, not elsewhere classified, thoracic region, initial encounter for fracture: Secondary | ICD-10-CM | POA: Diagnosis not present

## 2016-09-04 DIAGNOSIS — J449 Chronic obstructive pulmonary disease, unspecified: Secondary | ICD-10-CM | POA: Diagnosis present

## 2016-09-04 DIAGNOSIS — B9620 Unspecified Escherichia coli [E. coli] as the cause of diseases classified elsewhere: Secondary | ICD-10-CM | POA: Diagnosis present

## 2016-09-04 DIAGNOSIS — Z9981 Dependence on supplemental oxygen: Secondary | ICD-10-CM | POA: Diagnosis not present

## 2016-09-04 DIAGNOSIS — I130 Hypertensive heart and chronic kidney disease with heart failure and stage 1 through stage 4 chronic kidney disease, or unspecified chronic kidney disease: Secondary | ICD-10-CM | POA: Diagnosis present

## 2016-09-04 DIAGNOSIS — I5022 Chronic systolic (congestive) heart failure: Secondary | ICD-10-CM | POA: Diagnosis not present

## 2016-09-04 DIAGNOSIS — S3992XA Unspecified injury of lower back, initial encounter: Secondary | ICD-10-CM | POA: Diagnosis not present

## 2016-09-04 DIAGNOSIS — Z833 Family history of diabetes mellitus: Secondary | ICD-10-CM

## 2016-09-04 DIAGNOSIS — G4733 Obstructive sleep apnea (adult) (pediatric): Secondary | ICD-10-CM | POA: Diagnosis present

## 2016-09-04 DIAGNOSIS — Z8571 Personal history of Hodgkin lymphoma: Secondary | ICD-10-CM

## 2016-09-04 DIAGNOSIS — E119 Type 2 diabetes mellitus without complications: Secondary | ICD-10-CM | POA: Diagnosis not present

## 2016-09-04 DIAGNOSIS — E1122 Type 2 diabetes mellitus with diabetic chronic kidney disease: Secondary | ICD-10-CM | POA: Diagnosis present

## 2016-09-04 DIAGNOSIS — Z823 Family history of stroke: Secondary | ICD-10-CM

## 2016-09-04 DIAGNOSIS — Z794 Long term (current) use of insulin: Secondary | ICD-10-CM

## 2016-09-04 DIAGNOSIS — M4856XA Collapsed vertebra, not elsewhere classified, lumbar region, initial encounter for fracture: Secondary | ICD-10-CM | POA: Diagnosis present

## 2016-09-04 DIAGNOSIS — G8929 Other chronic pain: Secondary | ICD-10-CM | POA: Diagnosis present

## 2016-09-04 DIAGNOSIS — Z9221 Personal history of antineoplastic chemotherapy: Secondary | ICD-10-CM

## 2016-09-04 DIAGNOSIS — E039 Hypothyroidism, unspecified: Secondary | ICD-10-CM | POA: Diagnosis present

## 2016-09-04 DIAGNOSIS — D72819 Decreased white blood cell count, unspecified: Secondary | ICD-10-CM | POA: Diagnosis present

## 2016-09-04 DIAGNOSIS — I255 Ischemic cardiomyopathy: Secondary | ICD-10-CM | POA: Diagnosis present

## 2016-09-04 DIAGNOSIS — E11649 Type 2 diabetes mellitus with hypoglycemia without coma: Secondary | ICD-10-CM | POA: Diagnosis present

## 2016-09-04 DIAGNOSIS — N184 Chronic kidney disease, stage 4 (severe): Secondary | ICD-10-CM | POA: Diagnosis not present

## 2016-09-04 DIAGNOSIS — M549 Dorsalgia, unspecified: Secondary | ICD-10-CM | POA: Diagnosis present

## 2016-09-04 DIAGNOSIS — Z825 Family history of asthma and other chronic lower respiratory diseases: Secondary | ICD-10-CM

## 2016-09-04 DIAGNOSIS — R791 Abnormal coagulation profile: Secondary | ICD-10-CM | POA: Diagnosis present

## 2016-09-04 DIAGNOSIS — Z8051 Family history of malignant neoplasm of kidney: Secondary | ICD-10-CM

## 2016-09-04 DIAGNOSIS — S22000A Wedge compression fracture of unspecified thoracic vertebra, initial encounter for closed fracture: Secondary | ICD-10-CM | POA: Diagnosis present

## 2016-09-04 DIAGNOSIS — R102 Pelvic and perineal pain: Secondary | ICD-10-CM | POA: Diagnosis not present

## 2016-09-04 DIAGNOSIS — Z9851 Tubal ligation status: Secondary | ICD-10-CM

## 2016-09-04 DIAGNOSIS — E11319 Type 2 diabetes mellitus with unspecified diabetic retinopathy without macular edema: Secondary | ICD-10-CM | POA: Diagnosis present

## 2016-09-04 DIAGNOSIS — Z8572 Personal history of non-Hodgkin lymphomas: Secondary | ICD-10-CM

## 2016-09-04 DIAGNOSIS — Z95810 Presence of automatic (implantable) cardiac defibrillator: Secondary | ICD-10-CM

## 2016-09-04 DIAGNOSIS — Z9842 Cataract extraction status, left eye: Secondary | ICD-10-CM

## 2016-09-04 DIAGNOSIS — R748 Abnormal levels of other serum enzymes: Secondary | ICD-10-CM | POA: Diagnosis present

## 2016-09-04 DIAGNOSIS — Z882 Allergy status to sulfonamides status: Secondary | ICD-10-CM

## 2016-09-04 DIAGNOSIS — S32000A Wedge compression fracture of unspecified lumbar vertebra, initial encounter for closed fracture: Secondary | ICD-10-CM

## 2016-09-04 HISTORY — DX: Chronic kidney disease, stage 4 (severe): N18.4

## 2016-09-04 HISTORY — DX: Wedge compression fracture of unspecified lumbar vertebra, initial encounter for closed fracture: S32.000A

## 2016-09-04 LAB — CBC WITH DIFFERENTIAL/PLATELET
BASOS PCT: 0 %
Basophils Absolute: 0 10*3/uL (ref 0.0–0.1)
EOS ABS: 0 10*3/uL (ref 0.0–0.7)
EOS PCT: 2 %
HCT: 33.1 % — ABNORMAL LOW (ref 36.0–46.0)
Hemoglobin: 10.6 g/dL — ABNORMAL LOW (ref 12.0–15.0)
LYMPHS ABS: 0.9 10*3/uL (ref 0.7–4.0)
Lymphocytes Relative: 37 %
MCH: 30.4 pg (ref 26.0–34.0)
MCHC: 32 g/dL (ref 30.0–36.0)
MCV: 94.8 fL (ref 78.0–100.0)
MONO ABS: 0.3 10*3/uL (ref 0.1–1.0)
MONOS PCT: 12 %
Neutro Abs: 1.2 10*3/uL — ABNORMAL LOW (ref 1.7–7.7)
Neutrophils Relative %: 49 %
Platelets: 71 10*3/uL — ABNORMAL LOW (ref 150–400)
RBC: 3.49 MIL/uL — ABNORMAL LOW (ref 3.87–5.11)
RDW: 16.8 % — AB (ref 11.5–15.5)
WBC: 2.5 10*3/uL — ABNORMAL LOW (ref 4.0–10.5)

## 2016-09-04 LAB — COMPREHENSIVE METABOLIC PANEL
ALK PHOS: 117 U/L (ref 38–126)
ALT: 20 U/L (ref 14–54)
ANION GAP: 6 (ref 5–15)
AST: 35 U/L (ref 15–41)
Albumin: 2.8 g/dL — ABNORMAL LOW (ref 3.5–5.0)
BUN: 36 mg/dL — ABNORMAL HIGH (ref 6–20)
CALCIUM: 9.7 mg/dL (ref 8.9–10.3)
CO2: 29 mmol/L (ref 22–32)
CREATININE: 2.75 mg/dL — AB (ref 0.44–1.00)
Chloride: 101 mmol/L (ref 101–111)
GFR calc Af Amer: 19 mL/min — ABNORMAL LOW (ref >60)
GFR, EST NON AFRICAN AMERICAN: 16 mL/min — AB (ref >60)
Glucose, Bld: 130 mg/dL — ABNORMAL HIGH (ref 65–99)
Potassium: 3.7 mmol/L (ref 3.5–5.1)
SODIUM: 136 mmol/L (ref 135–145)
TOTAL PROTEIN: 5.5 g/dL — AB (ref 6.5–8.1)
Total Bilirubin: 0.8 mg/dL (ref 0.3–1.2)

## 2016-09-04 LAB — URINALYSIS, ROUTINE W REFLEX MICROSCOPIC
Bilirubin Urine: NEGATIVE
GLUCOSE, UA: NEGATIVE mg/dL
Hgb urine dipstick: NEGATIVE
KETONES UR: NEGATIVE mg/dL
NITRITE: NEGATIVE
PH: 5 (ref 5.0–8.0)
Protein, ur: NEGATIVE mg/dL
SPECIFIC GRAVITY, URINE: 1.011 (ref 1.005–1.030)

## 2016-09-04 LAB — LIPASE, BLOOD: LIPASE: 194 U/L — AB (ref 11–51)

## 2016-09-04 LAB — GLUCOSE, CAPILLARY: Glucose-Capillary: 82 mg/dL (ref 65–99)

## 2016-09-04 LAB — CBG MONITORING, ED: Glucose-Capillary: 60 mg/dL — ABNORMAL LOW (ref 65–99)

## 2016-09-04 LAB — APTT: aPTT: 102 seconds — ABNORMAL HIGH (ref 24–36)

## 2016-09-04 LAB — PROTIME-INR
INR: 4.76 — AB
PROTHROMBIN TIME: 46 s — AB (ref 11.4–15.2)

## 2016-09-04 MED ORDER — ESCITALOPRAM OXALATE 10 MG PO TABS
10.0000 mg | ORAL_TABLET | Freq: Every day | ORAL | Status: DC
  Administered 2016-09-05 – 2016-09-06 (×2): 10 mg via ORAL
  Filled 2016-09-04 (×2): qty 1

## 2016-09-04 MED ORDER — LEVOTHYROXINE SODIUM 25 MCG PO TABS
25.0000 ug | ORAL_TABLET | Freq: Every day | ORAL | Status: DC
  Administered 2016-09-05 – 2016-09-06 (×2): 25 ug via ORAL
  Filled 2016-09-04 (×2): qty 1

## 2016-09-04 MED ORDER — ALBUTEROL SULFATE (2.5 MG/3ML) 0.083% IN NEBU: 2.5000 mg | INHALATION_SOLUTION | Freq: Four times a day (QID) | RESPIRATORY_TRACT | Status: DC | PRN

## 2016-09-04 MED ORDER — CARVEDILOL 12.5 MG PO TABS
25.0000 mg | ORAL_TABLET | Freq: Two times a day (BID) | ORAL | Status: DC
  Administered 2016-09-04 – 2016-09-06 (×4): 25 mg via ORAL
  Filled 2016-09-04 (×5): qty 2

## 2016-09-04 MED ORDER — GABAPENTIN 300 MG PO CAPS
300.0000 mg | ORAL_CAPSULE | Freq: Every day | ORAL | Status: DC
  Administered 2016-09-04 – 2016-09-05 (×2): 300 mg via ORAL
  Filled 2016-09-04 (×2): qty 1

## 2016-09-04 MED ORDER — FLUTICASONE PROPIONATE 50 MCG/ACT NA SUSP
2.0000 | Freq: Every day | NASAL | Status: DC | PRN
  Filled 2016-09-04: qty 16

## 2016-09-04 MED ORDER — OXYCODONE-ACETAMINOPHEN 5-325 MG PO TABS
1.0000 | ORAL_TABLET | ORAL | Status: DC | PRN
Start: 2016-09-04 — End: 2016-09-06
  Administered 2016-09-04 – 2016-09-06 (×8): 1 via ORAL
  Filled 2016-09-04 (×9): qty 1

## 2016-09-04 MED ORDER — AMIODARONE HCL 200 MG PO TABS
200.0000 mg | ORAL_TABLET | Freq: Every day | ORAL | Status: DC
  Administered 2016-09-05 – 2016-09-06 (×2): 200 mg via ORAL
  Filled 2016-09-04 (×2): qty 1

## 2016-09-04 MED ORDER — LORAZEPAM 0.5 MG PO TABS
0.5000 mg | ORAL_TABLET | Freq: Every day | ORAL | Status: DC
  Administered 2016-09-04 – 2016-09-05 (×2): 0.5 mg via ORAL
  Filled 2016-09-04 (×2): qty 1

## 2016-09-04 MED ORDER — DEXTROSE 5 % IV SOLN
1.0000 g | INTRAVENOUS | Status: DC
  Administered 2016-09-04 – 2016-09-05 (×2): 1 g via INTRAVENOUS
  Filled 2016-09-04 (×3): qty 10

## 2016-09-04 MED ORDER — INSULIN ASPART 100 UNIT/ML ~~LOC~~ SOLN
0.0000 [IU] | Freq: Three times a day (TID) | SUBCUTANEOUS | Status: DC
  Administered 2016-09-05 – 2016-09-06 (×3): 3 [IU] via SUBCUTANEOUS
  Administered 2016-09-06: 5 [IU] via SUBCUTANEOUS

## 2016-09-04 NOTE — H&P (Signed)
History and Physical  [name] WCH:[id number] DOB: 1940-12-22 DOA: 09/04/2016  Referring physician: EDP PCP: Penni Homans, MD   Chief Complaint: back pain, not able to ambulate, not able to sleep, pain is tolerable laying flat, pain is excruciating with anymovement   HPI: Destiny [name] is a 76 y.o. female   With h/o cad s/p cabg in 2003, h/oPAF on coumadin, h/oChronic combined systolic and diastolic CHF,  h/o biv ICD with improvement in EF,  h/o CKD IV, h/o chronic hypercapneic and hypoxemic resp failure due to uncontrolled OSA / OHSon home 02, and nightly  noninvasive mechanical ventilation. h/o nonhodgkin's lymphoma s/p [facility] in 2010 and currently in remission per recent oncology in 08/2016,  She reported she had poor balance and fell  On 3/27 ,she was evaluated in the ED found to have compression fracture, she was discharged from the ED with pmd follow up, but she has progressive pain, she was evaluated by pmd was referred to IR for possible intervention, today she presented to the ED due to progressive back pain, not able to ambulate, not able to sleep, she also c/o dysuria, no fever. No chest pain,no sob,  no n/v, no lower extremity edema.   ED course: her vital is stable, wbc 2.5, hgb 10.6, platelet 71, cr 2.75, lipase 194, elevated INR at 4.6, cr spine obtained showed Progressive superior endplate compression fracture at L2. ua concerning for uti,  IR consulted due to concern of uti, thrombocytopenia, elevated inr, recommend to admit to hospitalist service for medical treatment and pain control, inr will get bone scan first to decided on possible intervention.  Review of Systems:  Detail per HPI, Review of systems are otherwise negative  Past Medical History:  Diagnosis Date  . AICD (automatic cardioverter/defibrillator) present   . Anemia, unspecified 12/13/2013  . Back pain, chronic    "just when I walk; mass on 3rd and 4th vertebrae right lower back"  . Biventricular  implantable cardiac defibrillator in situ 2007, 2012   a. 2007;  b. 2012 Gen change: SJM 3231-40 Uni BiV ICD, ser # [id number].  Marland Kitchen CAD (coronary artery disease)    a. 05/2001 CABG x4: LIMA->LAD, VG->D1, VG->D2, VG->RCA.  . Cardiomyopathy, ischemic    a. 2012 s/p SJM 3231-40 Uni BiV ICD, ser # [id number].  . Carotid stenosis    a. 10/19/2011 carotid duplex - Mild hard plaque bilaterally. Stable 40-59% bilateral ICA stenosis. Carotid US (04/2013):  Bilateral 40-59% ICA.  F/u 1 year  . Cerumen impaction 10/28/2015  . Chronic combined systolic and diastolic CHF (congestive heart failure) (HCC)    a. EF as low as 25% in 2006;  b. EF 60-65% in 12/2011;  c. 02/2013 Echo: EF 35-40, mod mid-dist antsept HK, Gr 2 DD, mild LVH.  . CKD (chronic kidney disease), stage III   . COPD (chronic obstructive pulmonary disease) ([place])   . 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 Dyslipidemia   . Gout ~ 08/2011  . Hypercalcemia 09/26/2013  . Hypertension   . Hypothyroidism   . Hypoxemia requiring supplemental oxygen   . LBBB (left bundle branch block)   . Morbid obesity with BMI of 45.0-49.9, adult (HCC)    Ht. 5'4". BMI 47.2  . Mural thrombus of left ventricle    before 2003, while on Coumadin, No h/o CVA  . Non-Hodgkin's lymphoma of inguinal region [facility]) 02/2009  mass; left; B-type; Dr. Benay Spice, in remission  . On home oxygen therapy    "normally 2-3L; 24/7" (12/14/2015)  . OSA on CPAP   . PAF (paroxysmal atrial fibrillation) (HCC)    on Coumadin  . Presence of permanent cardiac pacemaker   . Retinopathy due to secondary diabetes ([place])    type II, uncontrolled  . Urinary incontinence 06/25/2016  . Vertebral fracture, osteoporotic ([place]) 08/27/2016  . Vitamin D deficiency 06/09/2012   historical   Past Surgical History:  Procedure Laterality Date  . ABDOMINAL HYSTERECTOMY  1982  . CARDIAC CATHETERIZATION  06/03/01  .  CARDIOVERSION N/A 07/30/2014   Procedure: CARDIOVERSION;  Surgeon: Jolaine Artist, MD;  Location: [facility] ENDOSCOPY;  Service: Cardiovascular;  Laterality: N/A;  . CARDIOVERSION N/A 11/25/2014   Procedure: CARDIOVERSION;  Surgeon: Evans Lance, MD;  Location: [place];  Service: Cardiovascular;  Laterality: N/A;  . CATARACT EXTRACTION     left eye  . CHOLECYSTECTOMY  1970  . CORONARY ARTERY BYPASS GRAFT  2003   CABG X4  . INSERT / REPLACE / REMOVE PACEMAKER  2007; 2012   w/AICD  . REFRACTIVE SURGERY     right eye  . TEE WITHOUT CARDIOVERSION N/A 12/16/2015   Procedure: TRANSESOPHAGEAL ECHOCARDIOGRAM (TEE);  Surgeon: Jolaine Artist, MD;  Location: [facility] ENDOSCOPY;  Service: Cardiovascular;  Laterality: N/A;  . TUBAL LIGATION  1972   Social History:  reports that she has never smoked. She has never used smokeless tobacco. She reports that she does not drink alcohol or use drugs. Patient lives at home& not able to walk due to severe back pain recently  Allergies  Allergen Reactions  . Sulfonamide Derivatives Other (See Comments)    Unknown; "childhood allergy/mother"    Family History  Problem Relation Age of Onset  . Kidney cancer Mother     kidney and female repo - died @ 93  . Asthma Mother   . Cancer Mother     gyn and renal  . Heart disease Mother     CAD  . Heart disease Father     died @ 88  . Stroke Father   . Diabetes Father   . Hypertension Father   . Hyperlipidemia Father   . Heart attack Father   . Hypertension Son   . Kidney cancer Maternal Uncle   . Cancer Maternal Uncle   . Cirrhosis Maternal Grandmother     non alcohol  . Cancer Maternal Grandfather   . Kidney cancer Maternal Grandfather   . Hypertension Maternal Aunt       Prior to Admission medications   Medication Sig Start Date End Date Taking? Authorizing Provider  acetaminophen (TYLENOL) 500 MG tablet Take 1,000 mg by mouth 2 (two) times daily as needed for mild pain.     Historical  Provider, MD  albuterol (PROVENTIL HFA;VENTOLIN HFA) 108 (90 BASE) MCG/ACT inhaler Inhale 2 puffs into the lungs every 6 (six) hours as needed for wheezing or shortness of breath. 12/26/12   Mosie Lukes, MD  amiodarone (PACERONE) 200 MG tablet Take 200 mg by mouth daily.    Historical Provider, MD  carvedilol (COREG) 25 MG tablet Take 1 tablet (25 mg total) by mouth 2 (two) times daily. 07/12/16   Jolaine Artist, MD  escitalopram (LEXAPRO) 10 MG tablet Take 10 mg by mouth daily.    Historical Provider, MD  fluticasone (FLONASE) 50 MCG/ACT nasal spray Place 2 sprays into both nostrils daily as needed for  allergies or rhinitis. As needed for nasal stuffiness. 01/13/14   Mosie Lukes, MD  gabapentin (NEURONTIN) 300 MG capsule Take 300 mg by mouth at bedtime.    Historical Provider, MD  glucose blood (FREESTYLE LITE) test strip DX: 250.60  Check sugars bid and as needed Patient taking differently: 1 each by Other route See admin instructions. Check blood sugar twice daily 05/22/13   Mosie Lukes, MD  insulin NPH (HUMULIN N,NOVOLIN N) 100 UNIT/ML injection Inject 15-30 Units into the skin 2 (two) times daily before a meal. Take 40 units in the morning and 5 units in the evening    Historical Provider, MD  levothyroxine (SYNTHROID, LEVOTHROID) 50 MCG tablet Take 50 mcg by mouth daily before breakfast.    Historical Provider, MD  LORazepam (ATIVAN) 0.5 MG tablet Take 1 tablet (0.5 mg total) by mouth at bedtime. 04/19/16   Mosie Lukes, MD  oxyCODONE-acetaminophen (PERCOCET/ROXICET) 5-325 MG tablet Take 1 tablet by mouth every 4 (four) hours as needed for severe pain. 08/20/16   Ladell Pier, MD  OXYGEN Inhale 3 L into the lungs continuous.    Historical Provider, MD  torsemide (DEMADEX) 20 MG tablet Take 20-40 mg by mouth as directed. 2 tablets daily on Friday, Saturday & Sunday.  1 tablet daily Monday through Thursday.    Historical Provider, MD  warfarin (COUMADIN) 5 MG tablet TAKE AS DIRECTED BY  ANTICOAGULATION CLINIC 04/10/16   Evans Lance, MD    Physical Exam: BP 119/65   Pulse 95   Temp 98 F (36.7 C) (Oral)   Resp 18   Ht 5' (1.524 m)   Wt 101.2 kg (223 lb)   SpO2 99%   BMI 43.55 kg/m   General:  NAD, laying on bed, aaox3 Eyes: PERRL ENT: unremarkable Neck: supple, no JVD Cardiovascular: RRR Respiratory: CTABL Abdomen: soft/ND/ND, positive bowel sounds Skin: no rash Musculoskeletal:  No edema Psychiatric: calm/cooperative Neurologic: limited exam due to pain with any movement          Labs on Admission:  Basic Metabolic Panel:  Recent Labs Lab 09/04/16 1044  NA 136  K 3.7  CL 101  CO2 29  GLUCOSE 130*  BUN 36*  CREATININE 2.75*  CALCIUM 9.7   Liver Function Tests:  Recent Labs Lab 09/04/16 1044  AST 35  ALT 20  ALKPHOS 117  BILITOT 0.8  PROT 5.5*  ALBUMIN 2.8*    Recent Labs Lab 09/04/16 1044  LIPASE 194*   No results for input(s): AMMONIA in the last 168 hours. CBC:  Recent Labs Lab 09/04/16 1044  WBC 2.5*  NEUTROABS 1.2*  HGB 10.6*  HCT 33.1*  MCV 94.8  PLT 71*   Cardiac Enzymes: No results for input(s): CKTOTAL, CKMB, CKMBINDEX, TROPONINI in the last 168 hours.  BNP (last 3 results)  Recent Labs  12/05/15 1144 12/15/15 0423 01/01/16 1110  BNP 655.1* 758.8* 829.2*    ProBNP (last 3 results) No results for input(s): PROBNP in the last 8760 hours.  CBG: No results for input(s): GLUCAP in the last 168 hours.  Radiological Exams on Admission: Dg Sacrum/coccyx  Result Date: 09/04/2016 CLINICAL DATA:  Pain following fall EXAM: SACRUM AND COCCYX - 2+ VIEW COMPARISON:  CT pelvis August 09, 2011 FINDINGS: Frontal, tilt frontal, and lateral views were obtained. There is a small avulsion along the anterior mid coccyx. No other fracture. No diastases. There is osteoarthritic change in both sacroiliac joints. No erosive change. IMPRESSION: Small avulsion  arising from the anterior mid coccyx. No other fracture. No  diastases. Degenerative type change noted in each sacroiliac joint. Electronically Signed   By: Lowella Grip III M.D.   On: 09/04/2016 09:52   Ct Lumbar Spine Wo Contrast  Result Date: 09/04/2016 CLINICAL DATA:  Progressive low back pain after fall 3-4 weeks ago. Bilateral hip pain. Increased difficulty with ambulation. EXAM: CT LUMBAR SPINE WITHOUT CONTRAST TECHNIQUE: Multidetector CT imaging of the lumbar spine was performed without intravenous contrast administration. Multiplanar CT image reconstructions were also generated. COMPARISON:  CT of the lumbar spine 08/07/2016. FINDINGS: Segmentation: 5 non rib-bearing lumbar type vertebral bodies are present. Alignment: Grade 1 anterolisthesis at L4-5 is stable. AP alignment is otherwise anatomic. Vertebrae: A progressive superior endplate compression fracture is noted L2. The fracture extends into the more inferior aspects 7 vertebral body than on the prior exam. No significant collapse is evident on supine imaging. There is no significant retropulsion of bone. Paraspinal and other soft tissues: Calcifications are again seen within an enlarged spleen. Marked renal atrophy is noted bilaterally. Atherosclerotic changes are present in the aorta and branch vessels. No significant adenopathy is present. Disc levels: T12-L1: Mild facet hypertrophy and leftward disc bulging is present without significant stenosis. L1-2: A leftward disc bulging and bilateral facet hypertrophy is again noted. Disc bulging is slightly worse than on the prior study. Mild left subarticular and foraminal narrowing is evident. L2-3:  Mild facet hypertrophy is noted bilaterally. L3-4: Moderate facet hypertrophy is present bilaterally. A leftward disc bulge is noted. There is no significant interval change. L4-5: Advanced facet hypertrophy is present. A broad-based disc protrusion is again seen. Severe central and moderate bilateral foraminal stenosis is evident bilaterally. L5-S1: Moderate  facet spurring is present bilaterally. There is no significant stenosis. IMPRESSION: 1. Progressive superior endplate compression fracture at L2. Although there is no significant retropulsion of bone, disc disease is worse on the left, leading to left subarticular and foraminal narrowing at L1-2. 2. No significant progression in loss of height at L2-1 supine images. Upright images may demonstrate ossified. 3. Severe central and moderate bilateral foraminal stenosis at L4-5. 4. Multilevel facet degenerative change. 5.  Aortic Atherosclerosis (ICD10-I70.0). Electronically Signed   By: San Morelle M.D.   On: 09/04/2016 11:07   Ct Pelvis Wo Contrast  Result Date: 09/04/2016 CLINICAL DATA:  Pelvic pain, low back pain, BILATERAL hip pain, fell 3-4 weeks ago, worsening low back pain after mechanical fall EXAM: CT PELVIS WITHOUT CONTRAST TECHNIQUE: Multidetector CT imaging of the pelvis was performed following the standard protocol without intravenous contrast. Sagittal and coronal MPR images reconstructed from axial data set. COMPARISON:  RIGHT hip CT 08/07/2016 FINDINGS: Urinary Tract: Visualize distal ureters and urinary bladder unremarkable Bowel: Sigmoid diverticulosis without evidence of diverticulitis. Normal appendix. Remaining visualized bowel loops unremarkable Vascular/Lymphatic: Atherosclerotic calcifications throughout the iliac and femoral arteries bilaterally. Scattered pelvic phleboliths Reproductive: Uterus surgically absent. Nonvisualization of ovaries. Other: Minimal amount of nonspecific free fluid. No free air or inflammatory process Musculoskeletal: Diffuse osseous demineralization. Minimal degenerative changes at pubic symphysis and RIGHT hip joint. Significant degenerative facet disease changes at visualized lower lumbar spine. Multifactorial spinal stenosis at L4-L5, present since 2011. No fracture or bone destruction seen. IMPRESSION: Osseous demineralization with mild degenerative  changes of the RIGHT hip joint with more advanced facet degenerative changes at the visualized lower lumbar spine. Multifactorial spinal stenosis at L4-L5. No acute bony abnormalities. Sigmoid diverticulosis. Minimal nonspecific free pelvic fluid. Electronically Signed  By: Lavonia Dana M.D.   On: 09/04/2016 10:53     Assessment/Plan Present on Admission: . Compression fracture of body of thoracic vertebra (HCC)  L2 compression fracture/significant back pain Pain control, will follow IR recommendation.  UTI? ua is concerning for uti, she does report dysuria, urine culture and blood culture ordered, empirically start rocephin  Leukopenia, thrombocytopeia: Wbc 2.5, plt 71  worrisome due to h/o nonhodgkin's lymphoma, but per recent oncology note, she is in remission, will treat uti, if counts does not improving , need to close follow up with oncology or inpatient consult oncology.   CKD IV: euvolemic, cr at baseline, renal dosing meds  Elevated lipase in CKD patient  Lipase was ordered in the ED patient denies gi symptom, lft wnl, check lipid panel and repeat lipase in am  Insulin dependent dm2, a1c 6.2 on 08/27/2016 She had an episode of hypoglycemia in the ED due to " not eating in the ED", corrected Hold long acting insulin, start on ssi  afib on coumadin with supratherapeutic INR 4.6 , no acute bleeding Continue coreg/amodraon Hold coumadin for now, will not give vitamin k, no urgert procedure planned   Chronic combined systolic and diastolic CHF,  h/o biv ICD with improvement in EF,  euvolemic on exam, no edema, lung clear Hold Demadex for now, consider resume in 1-2 days depends on cr and volume status  h/o cad s/p cabg in 2003, no acute issues continue home meds.  chronic hypercapneic and hypoxemic resp failure due to uncontrolled OSA / OHS on home 02, and nightly  noninvasive mechanical ventilation. Will continue. No acute issues.  Morbid obesity:  Body mass index is  43.55 kg/m.  DVT prophylaxis: supratherapeutic inr  Consultants: interventional radiology  Code Status: full   Family Communication:  Patient   Disposition Plan: admit to med surg  Time spent: 48mins  Kycen Spalla MD, PhD [facility] Pager [phone] If 7PM-7AM, please contact night-coverage at [email or web address], password [facility]

## 2016-09-04 NOTE — ED Notes (Signed)
Patient transported to X-ray 

## 2016-09-04 NOTE — Care Management Note (Signed)
Case Management Note  Patient Details  Name: Destiny [name] MRN: [id number] Date of Birth: August 15, 1940  Subjective/Objective:                  From home with spouse. /75 y.o. female with extensive past medical history, pertinent PMH includes insulin-dependent T2DM, non-Hodgkin's lymphoma (in remission), hypertension, chronic systolic heart failure, paroxysmal atrial fibrillation on Coumadin, CABG, pacemaker presents to the ED with worsening bilateral lower back pain worse on the right that radiates to bilateral hips.  Action/Plan: Follow for disposition needs.   Expected Discharge Date:  09/06/16               Expected Discharge Plan:  [place]  In-House Referral:  NA  Discharge planning Services  CM Consult  Post Acute Care Choice:    Choice offered to:     DME Arranged:    DME Agency:     HH Arranged:    HH Agency:     Status of Service:  In process, will continue to follow  If discussed at Long Length of Stay Meetings, dates discussed:    Additional Comments:  Fuller Mandril, RN 09/04/2016, 2:10 PM

## 2016-09-04 NOTE — Telephone Encounter (Signed)
Pt's husband called to inform [facility] that the pt was currently in the ER and will be admitted to the Hospital.  He stated they are planning to do a back surgery while the pt is in the [place] that while in the Hospital they will manage the INR & he verbalized understanding. Also, advised to pt call back after pt is d/c from [place] with any updates & he verbalized understanding.

## 2016-09-04 NOTE — ED Notes (Signed)
Patient transported to CT 

## 2016-09-04 NOTE — ED Notes (Signed)
Pt CBG was 60, notified Jessica(RN)

## 2016-09-04 NOTE — ED Triage Notes (Signed)
Pt states she fell sometime at the end of March and was seen here for same. Pt states her lower back has been hurting since and now hurting into her right hip. Pt states yesterday she was also having burning sensation with urination. Pt ambulates at home with walker. Pt states it has been more difficult to ambulate due to pain. Pt also wears 2L [place] O2 at all times.

## 2016-09-04 NOTE — ED Notes (Signed)
Pt given peanut butter crackers and sprite; tolerated well

## 2016-09-04 NOTE — ED Provider Notes (Signed)
Medical screening examination/treatment/procedure(s) were conducted as a shared visit with non-physician practitioner(s) and myself.  I personally evaluated the patient during the encounter.  76 year old female here with a known lumbar vertebral fracture that seems to be having worsening pain and deconditioning secondary to the pain. She also has recently had some dysuria. On exam she is neurologically intact she can move all of her extremities she has good sensation in lower extremity is. Abdomen is benign. Her vital signs are within normal limits. Heart regular rate and rhythm with no murmurs rubs or gallops. Her lungs are clear to auscultation bilaterally. Suspect her symptoms are likely related to lumbar fracture we will reimage to make sure is not worsening. She has kyphoplasty consultation scheduled for next week. We'll also treat for urinary tract infection, and attempt pain control and if we achieve it, we will get care manager involved to get physical therapy so she doesn't get worse over the next couple weeks or she is waiting to get her procedure.   Merrily Pew, MD 09/05/16 [phone]

## 2016-09-04 NOTE — Progress Notes (Signed)
IR aware of request for possible VP/KP.  Patient with a history of lymphoma who fell 3-4 weeks ago and was noted to have an L2 compression fracture.  She has been treated conservatively, but presents secondary to worsening pain.  She is noted to have a possible UTI with a urine cx pending.  She is also noted to have thrombocytopenia at 71K and leukopenia at 2.5.  Her lymphoma is supposedly in remission.  She has a CT of her lumbar spine as she can't have an MRI secondary to her pacemaker.  She does appear to have a fracture, but Dr. Earleen Newport would like a bone scan to confirm this is acute and not chronic to determine if this is treatable.  She also takes coumadin and an INR is pending.  If her urine cx were to come back positive, we would not be able to proceed at this time with a procedure due to her risk for the cement becoming infected.  We will await the bone scan and follow her labs and cultures to determine candidacy for a possible VP/KP.  Destiny [name] E 3:31 PM 09/04/2016

## 2016-09-04 NOTE — ED Notes (Signed)
Admitting at bedside 

## 2016-09-04 NOTE — ED Notes (Signed)
Admitting alerted to abnormal labs

## 2016-09-04 NOTE — ED Provider Notes (Signed)
[facility]-EMERGENCY DEPT Provider Note   CSN: [id number] Arrival date & time: 09/04/16  0720     History   Chief Complaint Chief Complaint  Patient presents with  . Back Pain    HPI Destiny [name] is a 76 y.o. female with extensive past medical history, pertinent PMH includes insulin-dependent T2DM, non-Hodgkin's lymphoma (in remission), hypertension, chronic systolic heart failure, paroxysmal atrial fibrillation on Coumadin, CABG, pacemaker presents to the ED with worsening bilateral lower back pain worse on the right that radiates to bilateral hips.  When severe, pain radiates to bilateral lower abdomen. Symptoms started suddenly after mechanical fall that occurred last month, she was evaluated in the ED on 07/24/16 after the fall. CT scans of lumbar spine and hip showed lumbar compression fracture, she was discharged with Vicodin and PCP follow-up. Patient notes that over the last few weeks her pain has worsened, she has had decreased mobility at home due to pain. She has been needing assistance by her husband to ambulate and get out/in bed.  Over the last 2 days pain has been at its highest intensity and she has been unable to sleep. Alleviating factors include vicodin (prescribed by ED) and oxycodone (prescribed by cancer doctor) and tylenol. Aggravating factors include direct palpation, going from sitting to standing, trunk movements.  Patient  reports dysuria times one this morning.   No recent fevers, night sweats, chills, unintentional weight loss, URI symptoms, nausea, vomiting, diarrhea, constipation. No recent falls since 07/24/16. No lower extremity weakness. No saddle anesthesia, tingling or numbness to the lower extremities.  Of note, patient recently seen by PCP (Dr. Darrow Bussing) on 08/27/16 and referred to orthopedics for persistent back pain. Patient seen by oncology on 08/20/16 (Dr. Thornton Papas) for hypercalcemia, lab work returned and she continues to be in clinical remission from  non-Hodgkin's lymphoma.    HPI  Past Medical History:  Diagnosis Date  . AICD (automatic cardioverter/defibrillator) present   . Anemia, unspecified 12/13/2013  . Back pain, chronic    "just when I walk; mass on 3rd and 4th vertebrae right lower back"  . Biventricular implantable cardiac defibrillator in situ 2007, 2012   a. 2007;  b. 2012 Gen change: SJM 3231-40 Uni BiV ICD, ser # [id number].  Marland Kitchen CAD (coronary artery disease)    a. 05/2001 CABG x4: LIMA->LAD, VG->D1, VG->D2, VG->RCA.  . Cardiomyopathy, ischemic    a. 2012 s/p SJM 3231-40 Uni BiV ICD, ser # [id number].  . Carotid stenosis    a. 10/19/2011 carotid duplex - Mild hard plaque bilaterally. Stable 40-59% bilateral ICA stenosis. Carotid US (04/2013):  Bilateral 40-59% ICA.  F/u 1 year  . Cerumen impaction 10/28/2015  . Chronic combined systolic and diastolic CHF (congestive heart failure) (HCC)    a. EF as low as 25% in 2006;  b. EF 60-65% in 12/2011;  c. 02/2013 Echo: EF 35-40, mod mid-dist antsept HK, Gr 2 DD, mild LVH.  . CKD (chronic kidney disease), stage III   . COPD (chronic obstructive pulmonary disease) (HCC)   . 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 Dyslipidemia   . Gout ~ 08/2011  . Hypercalcemia 09/26/2013  . Hypertension   . Hypothyroidism   . Hypoxemia requiring supplemental oxygen   . LBBB (left bundle branch block)   . Morbid obesity with BMI of 45.0-49.9, adult (HCC)    Ht. 5'4". BMI 47.2  . Mural thrombus  of left ventricle    before 2003, while on Coumadin, No h/o CVA  . Non-Hodgkin's lymphoma of inguinal region ([place]) 02/2009   mass; left; B-type; Dr. Benay Spice, in remission  . On home oxygen therapy    "normally 2-3L; 24/7" (12/14/2015)  . OSA on CPAP   . PAF (paroxysmal atrial fibrillation) (HCC)    on Coumadin  . Presence of permanent cardiac pacemaker   . 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 Urinary incontinence 06/25/2016  . Vertebral fracture, osteoporotic ([place]) 08/27/2016  . Vitamin D deficiency 06/09/2012   historical    Patient Active Problem List   Diagnosis Date Noted  . Vertebral fracture, osteoporotic ([place]) 08/27/2016  . Urinary incontinence 06/25/2016  . Diabetic neuropathy ([place]) 03/01/2016  . Current use of insulin ([place]) 03/01/2016  . CKD (chronic kidney disease) stage 4, GFR 15-29 ml/min (HCC) 03/01/2016  . Skin lesion of back 02/18/2016  . COPD, severe ([place]) 01/22/2016  . Obesity hypoventilation syndrome ([place]) 01/01/2016  . Debility 12/14/2015  . Chronic respiratory failure ([place]) 10/25/2014  . Medicare annual wellness visit, subsequent 09/12/2014  . Coagulopathy ([place]) 07/29/2014  . Morbid obesity ([place]) 07/29/2014  . Controlled type 2 diabetes mellitus with chronic kidney disease ([place]) 07/28/2014  . Anemia 12/13/2013  . DOE (dyspnea on exertion) 11/30/2013  . Edema 09/26/2013  . Hypercalcemia 09/26/2013  . Encounter for therapeutic drug monitoring 06/10/2013  . Chronic systolic heart failure ([place]) 05/01/2013  . Chronic combined systolic and diastolic CHF (congestive heart failure) ([place]) 05/01/2013  . Physical deconditioning 03/25/2013  . Hyperkalemia 03/12/2013  . Hypothyroidism 03/06/2013  . Vitamin D deficiency 06/09/2012  . History of Cardiomyopathy, ischemic   . OSA (obstructive sleep apnea) 09/05/2010  . LEG PAIN, BILATERAL 12/28/2009  . Diabetes mellitus type 2 with retinopathy ([place]) 10/28/2009  . Paroxysmal atrial fibrillation ([place]) 06/21/2009  . History of non-Hodgkin's lymphoma 04/21/2009  . MURAL THROMBUS, LEFT VENTRICLE 03/07/2009  . LYMPHEDEMA 03/07/2009  . LBBB 08/26/2008  . History of CVA (cerebrovascular accident) 08/26/2008  . Backache 08/26/2008  . Automatic implantable cardioverter-defibrillator in situ 08/26/2008  . Carotid stenosis 05/20/2007  . Hyperlipidemia 03/19/2007  . Essential hypertension 11/20/2006     Past Surgical History:  Procedure Laterality Date  . ABDOMINAL HYSTERECTOMY  1982  . CARDIAC CATHETERIZATION  06/03/01  . CARDIOVERSION N/A 07/30/2014   Procedure: CARDIOVERSION;  Surgeon: Jolaine Artist, MD;  Location: [facility] ENDOSCOPY;  Service: Cardiovascular;  Laterality: N/A;  . CARDIOVERSION N/A 11/25/2014   Procedure: CARDIOVERSION;  Surgeon: Evans Lance, MD;  Location: [place];  Service: Cardiovascular;  Laterality: N/A;  . CATARACT EXTRACTION     left eye  . CHOLECYSTECTOMY  1970  . CORONARY ARTERY BYPASS GRAFT  2003   CABG X4  . INSERT / REPLACE / REMOVE PACEMAKER  2007; 2012   w/AICD  . REFRACTIVE SURGERY     right eye  . TEE WITHOUT CARDIOVERSION N/A 12/16/2015   Procedure: TRANSESOPHAGEAL ECHOCARDIOGRAM (TEE);  Surgeon: Jolaine Artist, MD;  Location[facility] ENDOSCOPY;  Service: Cardiovascular;  Laterality: N/A;  . TUBAL LIGATION  1972    OB History    No data available       Home Medications    Prior to Admission medications   Medication Sig Start Date End Date Taking? Authorizing Provider  acetaminophen (TYLENOL) 500 MG tablet Take 1,000 mg by mouth 2 (two) times daily as needed for mild pain.  Historical Provider, MD  albuterol (PROVENTIL HFA;VENTOLIN HFA) 108 (90 BASE) MCG/ACT inhaler Inhale 2 puffs into the lungs every 6 (six) hours as needed for wheezing or shortness of breath. 12/26/12   Bradd Canary, MD  amiodarone (PACERONE) 200 MG tablet Take 200 mg by mouth daily.    Historical Provider, MD  carvedilol (COREG) 25 MG tablet Take 1 tablet (25 mg total) by mouth 2 (two) times daily. 07/12/16   Dolores Patty, MD  escitalopram (LEXAPRO) 10 MG tablet Take 10 mg by mouth daily.    Historical Provider, MD  fluticasone (FLONASE) 50 MCG/ACT nasal spray Place 2 sprays into both nostrils daily as needed for allergies or rhinitis. As needed for nasal stuffiness. 01/13/14   Bradd Canary, MD  gabapentin (NEURONTIN) 300 MG capsule Take 300 mg by mouth at  bedtime.    Historical Provider, MD  glucose blood (FREESTYLE LITE) test strip DX: 250.60  Check sugars bid and as needed Patient taking differently: 1 each by Other route See admin instructions. Check blood sugar twice daily 05/22/13   Bradd Canary, MD  insulin NPH (HUMULIN N,NOVOLIN N) 100 UNIT/ML injection Inject 15-30 Units into the skin 2 (two) times daily before a meal. Take 40 units in the morning and 5 units in the evening    Historical Provider, MD  levothyroxine (SYNTHROID, LEVOTHROID) 50 MCG tablet Take 50 mcg by mouth daily before breakfast.    Historical Provider, MD  LORazepam (ATIVAN) 0.5 MG tablet Take 1 tablet (0.5 mg total) by mouth at bedtime. 04/19/16   Bradd Canary, MD  oxyCODONE-acetaminophen (PERCOCET/ROXICET) 5-325 MG tablet Take 1 tablet by mouth every 4 (four) hours as needed for severe pain. 08/20/16   Ladene Artist, MD  OXYGEN Inhale 3 L into the lungs continuous.    Historical Provider, MD  torsemide (DEMADEX) 20 MG tablet Take 20-40 mg by mouth as directed. 2 tablets daily on Friday, Saturday & Sunday.  1 tablet daily Monday through Thursday.    Historical Provider, MD  warfarin (COUMADIN) 5 MG tablet TAKE AS DIRECTED BY ANTICOAGULATION CLINIC 04/10/16   Marinus Maw, MD    Family History Family History  Problem Relation Age of Onset  . Kidney cancer Mother     kidney and female repo - died @ 35  . Asthma Mother   . Cancer Mother     gyn and renal  . Heart disease Mother     CAD  . Heart disease Father     died @ 40  . Stroke Father   . Diabetes Father   . Hypertension Father   . Hyperlipidemia Father   . Heart attack Father   . Hypertension Son   . Kidney cancer Maternal Uncle   . Cancer Maternal Uncle   . Cirrhosis Maternal Grandmother     non alcohol  . Cancer Maternal Grandfather   . Kidney cancer Maternal Grandfather   . Hypertension Maternal Aunt     Social History Social History  Substance Use Topics  . Smoking status: Never Smoker    . Smokeless tobacco: Never Used  . Alcohol use No     Allergies   Sulfonamide derivatives   Review of Systems Review of Systems  Constitutional: Positive for chills. Negative for fatigue and fever.  HENT: Negative for congestion, rhinorrhea and sore throat.   Respiratory: Negative for cough, chest tightness and shortness of breath.   Cardiovascular: Negative for chest pain, palpitations and leg swelling.  Gastrointestinal: Positive for abdominal pain. Negative for blood in stool, constipation, diarrhea and nausea.  Endocrine: Negative for polydipsia, polyphagia and polyuria.  Musculoskeletal: Positive for back pain and gait problem.  Skin: Negative for wound.  Allergic/Immunologic: Positive for immunocompromised state.  Neurological: Negative for weakness, light-headedness and numbness.     Physical Exam Updated Vital Signs BP 119/65   Pulse 95   Temp 98 F (36.7 C) (Oral)   Resp 18   Ht 5' (1.524 m)   Wt 101.2 kg   SpO2 99%   BMI 43.55 kg/m   Physical Exam  Constitutional: She is oriented to person, place, and time. She appears well-developed and well-nourished.  HENT:  Head: Normocephalic and atraumatic.  Nose: Nose normal.  Mouth/Throat: Oropharynx is clear and moist. No oropharyngeal exudate.  Eyes: Conjunctivae and EOM are normal. Pupils are equal, round, and reactive to light.  Neck: Normal range of motion. Neck supple. No JVD present.  Cardiovascular: Normal rate, regular rhythm and intact distal pulses.   No murmur heard. Pulmonary/Chest: Effort normal and breath sounds normal. She has no wheezes. She has no rales.  Abdominal:  +Surgical abdominal scars noted.  Large abdomen Active bowel sounds throughout.  Abdomen is soft, non tender without distention, rigidity, guarding or rebound.  No suprapubic tenderness. No CVAT.   Musculoskeletal: Normal range of motion. She exhibits tenderness. She exhibits no deformity.  Gait normal but slow, needs 1  assistance Decreased active ROM of lumbar spine including flexion, extension, lateral bend and rotation secondary to pain +Midline lumbar spine tenderness with lumbar paraspinal muscular tenderness  +R SI joints and sciatic notch tender.  +Faber (right) +Pain with R hip internal/external rotation Full passive hip, knee and ankle ROM bilaterally.  Negative SLR. Negative Stinchfield test.   Lymphadenopathy:    She has no cervical adenopathy.  Neurological: She is alert and oriented to person, place, and time. No sensory deficit.  5/5 strength with hip flexion and extension, bilaterally.  5/5 strength with knee flexion and extension, bilaterally.  5/5 strength with ankle dorsiflexion and plantar flexion, bilaterally.  Sensation to light touch intact in the distribution of the obturator nerve, lateral cutaneous nerve, femoral nerve, common fibular nerve.   Foot: sensation to light touch intact in the distribution of the saphenous nerve, medial plantar nerve, lateral plantar nerve, bilaterally.   Skin: Skin is warm and dry. Capillary refill takes less than 2 seconds.  Psychiatric: She has a normal mood and affect. Her behavior is normal. Judgment and thought content normal.  Nursing note and vitals reviewed.    ED Treatments / Results  Labs (all labs ordered are listed, but only abnormal results are displayed) Labs Reviewed  URINALYSIS, ROUTINE W REFLEX MICROSCOPIC - Abnormal; Notable for the following:       Result Value   Leukocytes, UA MODERATE (*)    Bacteria, UA RARE (*)    Squamous Epithelial / LPF 0-5 (*)    All other components within normal limits  CBC WITH DIFFERENTIAL/PLATELET - Abnormal; Notable for the following:    WBC 2.5 (*)    RBC 3.49 (*)    Hemoglobin 10.6 (*)    HCT 33.1 (*)    RDW 16.8 (*)    Platelets 71 (*)    Neutro Abs 1.2 (*)    All other components within normal limits  COMPREHENSIVE METABOLIC PANEL - Abnormal; Notable for the following:    Glucose,  Bld 130 (*)    BUN 36 (*)  Creatinine, Ser 2.75 (*)    Total Protein 5.5 (*)    Albumin 2.8 (*)    GFR calc non Af Amer 16 (*)    GFR calc Af Amer 19 (*)    All other components within normal limits  LIPASE, BLOOD - Abnormal; Notable for the following:    Lipase 194 (*)    All other components within normal limits  URINE CULTURE  PROTIME-INR  APTT    EKG  EKG Interpretation None       Radiology Dg Sacrum/coccyx  Result Date: 09/04/2016 CLINICAL DATA:  Pain following fall EXAM: SACRUM AND COCCYX - 2+ VIEW COMPARISON:  CT pelvis August 09, 2011 FINDINGS: Frontal, tilt frontal, and lateral views were obtained. There is a small avulsion along the anterior mid coccyx. No other fracture. No diastases. There is osteoarthritic change in both sacroiliac joints. No erosive change. IMPRESSION: Small avulsion arising from the anterior mid coccyx. No other fracture. No diastases. Degenerative type change noted in each sacroiliac joint. Electronically Signed   By: Lowella Grip III M.D.   On: 09/04/2016 09:52   Ct Lumbar Spine Wo Contrast  Result Date: 09/04/2016 CLINICAL DATA:  Progressive low back pain after fall 3-4 weeks ago. Bilateral hip pain. Increased difficulty with ambulation. EXAM: CT LUMBAR SPINE WITHOUT CONTRAST TECHNIQUE: Multidetector CT imaging of the lumbar spine was performed without intravenous contrast administration. Multiplanar CT image reconstructions were also generated. COMPARISON:  CT of the lumbar spine 08/07/2016. FINDINGS: Segmentation: 5 non rib-bearing lumbar type vertebral bodies are present. Alignment: Grade 1 anterolisthesis at L4-5 is stable. AP alignment is otherwise anatomic. Vertebrae: A progressive superior endplate compression fracture is noted L2. The fracture extends into the more inferior aspects 7 vertebral body than on the prior exam. No significant collapse is evident on supine imaging. There is no significant retropulsion of bone. Paraspinal and  other soft tissues: Calcifications are again seen within an enlarged spleen. Marked renal atrophy is noted bilaterally. Atherosclerotic changes are present in the aorta and branch vessels. No significant adenopathy is present. Disc levels: T12-L1: Mild facet hypertrophy and leftward disc bulging is present without significant stenosis. L1-2: A leftward disc bulging and bilateral facet hypertrophy is again noted. Disc bulging is slightly worse than on the prior study. Mild left subarticular and foraminal narrowing is evident. L2-3:  Mild facet hypertrophy is noted bilaterally. L3-4: Moderate facet hypertrophy is present bilaterally. A leftward disc bulge is noted. There is no significant interval change. L4-5: Advanced facet hypertrophy is present. A broad-based disc protrusion is again seen. Severe central and moderate bilateral foraminal stenosis is evident bilaterally. L5-S1: Moderate facet spurring is present bilaterally. There is no significant stenosis. IMPRESSION: 1. Progressive superior endplate compression fracture at L2. Although there is no significant retropulsion of bone, disc disease is worse on the left, leading to left subarticular and foraminal narrowing at L1-2. 2. No significant progression in loss of height at L2-1 supine images. Upright images may demonstrate ossified. 3. Severe central and moderate bilateral foraminal stenosis at L4-5. 4. Multilevel facet degenerative change. 5.  Aortic Atherosclerosis (ICD10-I70.0). Electronically Signed   By: San Morelle M.D.   On: 09/04/2016 11:07   Ct Pelvis Wo Contrast  Result Date: 09/04/2016 CLINICAL DATA:  Pelvic pain, low back pain, BILATERAL hip pain, fell 3-4 weeks ago, worsening low back pain after mechanical fall EXAM: CT PELVIS WITHOUT CONTRAST TECHNIQUE: Multidetector CT imaging of the pelvis was performed following the standard protocol without intravenous contrast. Sagittal and  coronal MPR images reconstructed from axial data set.  COMPARISON:  RIGHT hip CT 08/07/2016 FINDINGS: Urinary Tract: Visualize distal ureters and urinary bladder unremarkable Bowel: Sigmoid diverticulosis without evidence of diverticulitis. Normal appendix. Remaining visualized bowel loops unremarkable Vascular/Lymphatic: Atherosclerotic calcifications throughout the iliac and femoral arteries bilaterally. Scattered pelvic phleboliths Reproductive: Uterus surgically absent. Nonvisualization of ovaries. Other: Minimal amount of nonspecific free fluid. No free air or inflammatory process Musculoskeletal: Diffuse osseous demineralization. Minimal degenerative changes at pubic symphysis and RIGHT hip joint. Significant degenerative facet disease changes at visualized lower lumbar spine. Multifactorial spinal stenosis at L4-L5, present since 2011. No fracture or bone destruction seen. IMPRESSION: Osseous demineralization with mild degenerative changes of the RIGHT hip joint with more advanced facet degenerative changes at the visualized lower lumbar spine. Multifactorial spinal stenosis at L4-L5. No acute bony abnormalities. Sigmoid diverticulosis. Minimal nonspecific free pelvic fluid. Electronically Signed   By: Lavonia Dana M.D.   On: 09/04/2016 10:53    Procedures Procedures (including critical care time)  Medications Ordered in ED Medications - No data to display   Initial Impression / Assessment and Plan / ED Course  I have reviewed the triage vital signs and the nursing notes.  Pertinent labs & imaging results that were available during my care of the patient were reviewed by me and considered in my medical decision making (see chart for details).  Clinical Course as of Sep 04 1433  Tue Sep 04, 2016  1049 IMPRESSION: Small avulsion arising from the anterior mid coccyx. No other fracture. No diastases. Degenerative type change noted in each sacroiliac joint. DG Sacrum/Coccyx [CG]  1113 IMPRESSION: Osseous demineralization with mild degenerative  changes of the RIGHT hip joint with more advanced facet degenerative changes at the visualized lower lumbar spine. Multifactorial spinal stenosis at L4-L5. No acute bony abnormalities. Sigmoid diverticulosis. Minimal nonspecific free pelvic fluid. CT Pelvis Wo Contrast [CG]  1115 IMPRESSION: 1. Progressive superior endplate compression fracture at L2. Although there is no significant retropulsion of bone, disc disease is worse on the left, leading to left subarticular and foraminal narrowing at L1-2. 2. No significant progression in loss of height at L2-1 supine images. Upright images may demonstrate ossified. 3. Severe central and moderate bilateral foraminal stenosis at L4-5. 4. Multilevel facet degenerative change. 5. Aortic Atherosclerosis (ICD10-I70.0). CT Lumbar Spine Wo Contrast [CG]  1115 Leukocytes, UA: (!) MODERATE [CG]  1115 WBC, UA: TOO NUMEROUS TO COUNT [CG]  1115 Bacteria, UA: (!) RARE [CG]  1116 WBC: (!) 2.5 [CG]  1116 Hemoglobin: (!) 10.6 [CG]  1156 EGFR (Non-African Amer.): (!) 16 [CG]  1157 Creatinine: (!) 2.75 [CG]  1157 Lipase: (!) 194 [CG]    Clinical Course User Index [CG] Kinnie Feil, PA-C   76 year old female with extensive past medical history presents with worsening bilateral low back pain worse on the right side. Symptoms started after mechanical fall on 08/03/16, she was seen in the ED on 08/07/16 and found to have a compression fracture at L2 with 10% height compression on CT scan. Pain has been progressively worsening, patient has been unable to sleep due to the pain the past 2 days. Patient reports significant decrease in mobility at home, requires husband to assist her to get in and out of bed, walk, go to the commode. Patient has been taking Vicodin and oxycodone with temporary, mild relief of pain. No recent falls since 08/03/16. Patient has h/o non-hodgkin's lymphoma, was recently evaluated by oncology and determined to be in clinical remission.  Patient has been referred to interventional radiologist and has upcoming appointment on 09/12/2016 with IR.  On my exam vital signs are within normal limits. RRR. Lungs CTAP. Abdomen is soft, NTND.  No suprapubic tenderness or CVAT. Patient has midline lumbar spinous process tenderness, right SI joint tenderness, right sciatic notch tenderness. Positive Faber test on the right side. Negative SLR bilaterally. Neurological exam in the bilateral lower extremities is normal. No focal neurological deficits or weakness in LEs. Given progressive worsening of pain, i'll repeat CT pelvis and lumbar spine, and also get plain films of sacrum and coccyx to rule out fracture.  Plain films revealed small avulsion from the anterior mid coccyx, otherwise no sacral fracture. CT lumbar spine showed a progressive compression fracture at L2 and now severe central foraminal stenosis at L4-L5. Imaging shows progression of lumbar spine injury which explains worsening pain and decrease in mobility and ADLs at home. I will consult IR for recommendations.   1:15PM: Spoke to Dr. Miles Costain who recommends admission for pain control, IR evaluation in ED and likely kyphoplasty in the next 1-2 days.  Also reasonable to discharge patient with adequate pain control and close f/u with IR appointment on 09/12/16.  Will discuss with patient.   2:00 PM: Patient and husband agreeable for admission.  Will need abx for UTI and pain control as inpatient.  IR evaluation and management order in. Pending IR eval and admission. Patient on coumadin, pending PT/INR  Patient, ED treatment and discharge plan was discussed with supervising physician who also evaluated the patient and is agreeable with plan.    Final Clinical Impressions(s) / ED Diagnoses   Final diagnoses:  Chronic right-sided low back pain without sciatica    New Prescriptions New Prescriptions   No medications on file     Arlean Hopping 09/04/16 [place], MD 09/05/16 [phone]

## 2016-09-05 ENCOUNTER — Inpatient Hospital Stay (HOSPITAL_COMMUNITY): Payer: Medicare Other

## 2016-09-05 DIAGNOSIS — S32020G Wedge compression fracture of second lumbar vertebra, subsequent encounter for fracture with delayed healing: Secondary | ICD-10-CM

## 2016-09-05 DIAGNOSIS — N3000 Acute cystitis without hematuria: Secondary | ICD-10-CM

## 2016-09-05 DIAGNOSIS — S32020A Wedge compression fracture of second lumbar vertebra, initial encounter for closed fracture: Secondary | ICD-10-CM

## 2016-09-05 DIAGNOSIS — D696 Thrombocytopenia, unspecified: Secondary | ICD-10-CM

## 2016-09-05 LAB — COMPREHENSIVE METABOLIC PANEL
ALT: 19 U/L (ref 14–54)
AST: 34 U/L (ref 15–41)
Albumin: 2.7 g/dL — ABNORMAL LOW (ref 3.5–5.0)
Alkaline Phosphatase: 110 U/L (ref 38–126)
Anion gap: 9 (ref 5–15)
BILIRUBIN TOTAL: 1.3 mg/dL — AB (ref 0.3–1.2)
BUN: 35 mg/dL — AB (ref 6–20)
CHLORIDE: 102 mmol/L (ref 101–111)
CO2: 27 mmol/L (ref 22–32)
CREATININE: 3.02 mg/dL — AB (ref 0.44–1.00)
Calcium: 9.5 mg/dL (ref 8.9–10.3)
GFR, EST AFRICAN AMERICAN: 17 mL/min — AB (ref >60)
GFR, EST NON AFRICAN AMERICAN: 14 mL/min — AB (ref >60)
Glucose, Bld: 163 mg/dL — ABNORMAL HIGH (ref 65–99)
Potassium: 4.2 mmol/L (ref 3.5–5.1)
Sodium: 138 mmol/L (ref 135–145)
TOTAL PROTEIN: 5.6 g/dL — AB (ref 6.5–8.1)

## 2016-09-05 LAB — GLUCOSE, CAPILLARY
GLUCOSE-CAPILLARY: 76 mg/dL (ref 65–99)
GLUCOSE-CAPILLARY: 190 mg/dL — AB (ref 65–99)
GLUCOSE-CAPILLARY: 163 mg/dL — AB (ref 65–99)

## 2016-09-05 LAB — CBC WITH DIFFERENTIAL/PLATELET
Basophils Absolute: 0 10*3/uL (ref 0.0–0.1)
Basophils Relative: 0 %
EOS ABS: 0.1 10*3/uL (ref 0.0–0.7)
Eosinophils Relative: 2 %
HEMATOCRIT: 34 % — AB (ref 36.0–46.0)
Hemoglobin: 11.2 g/dL — ABNORMAL LOW (ref 12.0–15.0)
LYMPHS ABS: 0.9 10*3/uL (ref 0.7–4.0)
LYMPHS PCT: 31 %
MCH: 31.7 pg (ref 26.0–34.0)
MCHC: 32.9 g/dL (ref 30.0–36.0)
MCV: 96.3 fL (ref 78.0–100.0)
MONOS PCT: 14 %
Monocytes Absolute: 0.4 10*3/uL (ref 0.1–1.0)
NEUTROS PCT: 53 %
Neutro Abs: 1.5 10*3/uL — ABNORMAL LOW (ref 1.7–7.7)
Platelets: 73 10*3/uL — ABNORMAL LOW (ref 150–400)
RBC: 3.53 MIL/uL — AB (ref 3.87–5.11)
RDW: 17.2 % — ABNORMAL HIGH (ref 11.5–15.5)
WBC: 2.8 10*3/uL — ABNORMAL LOW (ref 4.0–10.5)

## 2016-09-05 LAB — PROTIME-INR
INR: 4.46 — AB
PROTHROMBIN TIME: 43.6 s — AB (ref 11.4–15.2)

## 2016-09-05 LAB — LIPID PANEL
CHOLESTEROL: 154 mg/dL (ref 0–200)
HDL: 21 mg/dL — AB (ref >40)
LDL CALC: 88 mg/dL (ref 0–99)
TRIGLYCERIDES: 226 mg/dL — AB (ref <150)
Total CHOL/HDL Ratio: 7.3 RATIO
VLDL: 45 mg/dL — AB (ref 0–40)

## 2016-09-05 LAB — LIPASE, BLOOD: Lipase: 146 U/L — ABNORMAL HIGH (ref 11–51)

## 2016-09-05 MED ORDER — TECHNETIUM TC 99M MEDRONATE IV KIT
25.0000 | PACK | Freq: Once | INTRAVENOUS | Status: AC | PRN
  Administered 2016-09-05: 25 via INTRAVENOUS

## 2016-09-05 NOTE — Progress Notes (Signed)
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 Patient had fall on March 2017 when she was evaluated in the ER found to have compression fracture at that time she has discharge with outpatient PCP follow-up. Patient reported that she was evaluated by PCP and was referred to interventional radiology for possible intervention. The pain became so severe that she could not wait therefore decided to come to ER. In the ER imaging studies consistent with compression fracture of L2. UA concerning for UTI, IR was consulted and admitted for further evaluation.  Assessment & Plan:  # Intractable back pain due to compression fracture of body of thoracic vertebra [facility]): Evaluated by IR, getting bone scan. Follow-up results.  Patient is controlled with oxycodone. PT OT therapy and supportive care. Follow-up IR for their plan.  #Paroxysmal atrial fibrillation with supratherapeutic INR: Comment on hold. Monitor INR. No sign of bleeding. Monitor heart rate.  On Coreg and amiodarone.  #Diabetes: Continue current insulin regimen. Monitor blood sugar level.  #Urinary tract infection, likely acute cystitis without hematuria: Urine culture growing Escherichia coli. Currently on ceftriaxone.  #Hypothyroidism: Continue Synthroid.  #COPD with chronic hypoxic respiratory failure: Continue oxygen, breathing treatment. Recommended outpatient follow-up with  pulmonologist.  # Thrombocytopenia: Stable platelet count. Patient has history of Hodgkin's lymphoma and follow-up with hematologist/oncologist outpatient. Continue to monitor.  DVT prophylaxis: Systemic anticoagulation Code Status: Full code Family Communication: Patient's husband at bedside Disposition Plan: Likely discharge home with home care. PT OT ordered.    Consultants:   IR  Procedures: None Antimicrobials: IV ceftriaxone since April 24  Subjective: Patient was seen and examined at bedside. Reported doing well. Pain is better. Denied headache, dizziness, nausea, vomiting, chest pain or shortness of breath.  Objective: Vitals:   09/04/16 1824 09/05/16 0100 09/05/16 0555 09/05/16 1003  BP: (!) 102/45 (!) 141/82 103/67 (!) 106/33  Pulse: 72 71 62 69  Resp: 20 20 20 20   Temp: 97.6 F (36.4 C) 98.4 F (36.9 C) 97.8 F (36.6 C) 98.7 F (37.1 C)  TempSrc: Oral Oral Oral Oral  SpO2: 100% 93% 97% 98%  Weight:      Height:        Intake/Output Summary (Last 24 hours) at 09/05/16 1523 Last data filed at 09/05/16 1100  Gross per 24 hour  Intake              600 ml  Output              600 ml  Net                0 ml   Filed Weights   09/04/16 0727  Weight: 101.2 kg (223 lb)    Examination:  General exam: Appears calm and comfortable  Respiratory system: Clear to auscultation. Respiratory effort normal. No wheezing or crackle Cardiovascular system: S1 & S2 heard, RRR.  No pedal edema. Gastrointestinal system: Abdomen is nondistended, soft and nontender. Normal bowel sounds heard. Central nervous system: Alert and oriented. No focal neurological deficits.  Extremities: Symmetric 5 x 5 power. Skin: No rashes, lesions or ulcers   Data Reviewed: I have personally reviewed following labs and imaging studies  CBC:  Recent Labs Lab 09/04/16 1044 09/05/16 0857  WBC 2.5* 2.8*  NEUTROABS 1.2* 1.5*  HGB 10.6* 11.2*  HCT 33.1* 34.0*  MCV 94.8 96.3  PLT 71* 73*    Basic Metabolic Panel:  Recent Labs Lab 09/04/16 1044 09/05/16 0857  NA 136 138  K 3.7 4.2  CL 101 102  CO2 29 27  GLUCOSE 130* 163*  BUN 36* 35*  CREATININE 2.75* 3.02*  CALCIUM 9.7 9.5   GFR: Estimated Creatinine Clearance: 17.8 mL/min (A) (by C-G formula based on SCr of 3.02 mg/dL (H)). Liver Function Tests:  Recent Labs Lab 09/04/16 1044 09/05/16 0857  AST 35 34  ALT 20 19  ALKPHOS 117 110  BILITOT 0.8 1.3*  PROT 5.5* 5.6*  ALBUMIN 2.8* 2.7*    Recent Labs Lab 09/04/16 1044 09/05/16 0857  LIPASE 194* 146*   No results for input(s): AMMONIA in the last 168 hours. Coagulation Profile:  Recent Labs Lab 09/04/16 1608 09/05/16 0857  INR 4.76* 4.46*   Cardiac Enzymes: No results for input(s): CKTOTAL, CKMB, CKMBINDEX, TROPONINI in the last 168 hours. BNP (last 3 results) No results for input(s): PROBNP in the last 8760 hours. HbA1C: No results for input(s): HGBA1C in the last 72 hours. CBG:  Recent Labs Lab 09/04/16 1658 09/04/16 1835 09/05/16 0634 09/05/16 1201  GLUCAP 60* 82 76 190*   Lipid Profile:  Recent Labs  09/05/16 0857  CHOL 154  HDL 21*  LDLCALC 88  TRIG 226*  CHOLHDL 7.3   Thyroid Function Tests: No results for input(s): TSH, T4TOTAL, FREET4, T3FREE, THYROIDAB in the last 72 hours. Anemia Panel: No results for input(s): VITAMINB12, FOLATE, FERRITIN, TIBC, IRON, RETICCTPCT in the last 72 hours. Sepsis Labs: No results for input(s): PROCALCITON, LATICACIDVEN in the last 168 hours.  Recent Results (from the past 240 hour(s))  Urine culture     Status: Abnormal (Preliminary result)   Collection Time: 09/04/16  9:23 AM  Result Value Ref Range Status   Specimen Description URINE, CLEAN CATCH  Final   Special Requests NONE  Final   Culture (A)  Final    >=100,000 COLONIES/mL ESCHERICHIA COLI SUSCEPTIBILITIES TO FOLLOW    Report Status PENDING  Incomplete  Blood culture (routine x 2)     Status: None (Preliminary result)    Collection Time: 09/04/16  6:55 PM  Result Value Ref Range Status   Specimen Description BLOOD LEFT ANTECUBITAL  Final   Special Requests   Final    BOTTLES DRAWN AEROBIC ONLY Blood Culture adequate volume PATIENT ON FOLLOWING ROCEPHIN   Culture NO GROWTH < 24 HOURS  Final   Report Status PENDING  Incomplete  Blood culture (routine x 2)     Status: None (Preliminary result)   Collection Time: 09/04/16  7:05 PM  Result Value Ref Range Status   Specimen Description BLOOD LEFT HAND  Final   Special Requests   Final    IN PEDIATRIC BOTTLE Blood Culture adequate volume PATIENT ON FOLLOWING ROCEPHIN   Culture NO GROWTH < 24 HOURS  Final   Report Status PENDING  Incomplete         Radiology Studies: Dg Sacrum/coccyx  Result Date: 09/04/2016 CLINICAL DATA:  Pain following fall EXAM: SACRUM AND COCCYX - 2+ VIEW COMPARISON:  CT pelvis August 09, 2011 FINDINGS: Frontal, tilt frontal, and  lateral views were obtained. There is a small avulsion along the anterior mid coccyx. No other fracture. No diastases. There is osteoarthritic change in both sacroiliac joints. No erosive change. IMPRESSION: Small avulsion arising from the anterior mid coccyx. No other fracture. No diastases. Degenerative type change noted in each sacroiliac joint. Electronically Signed   By: Lowella Grip III M.D.   On: 09/04/2016 09:52   Ct Lumbar Spine Wo Contrast  Result Date: 09/04/2016 CLINICAL DATA:  Progressive low back pain after fall 3-4 weeks ago. Bilateral hip pain. Increased difficulty with ambulation. EXAM: CT LUMBAR SPINE WITHOUT CONTRAST TECHNIQUE: Multidetector CT imaging of the lumbar spine was performed without intravenous contrast administration. Multiplanar CT image reconstructions were also generated. COMPARISON:  CT of the lumbar spine 08/07/2016. FINDINGS: Segmentation: 5 non rib-bearing lumbar type vertebral bodies are present. Alignment: Grade 1 anterolisthesis at L4-5 is stable. AP alignment is  otherwise anatomic. Vertebrae: A progressive superior endplate compression fracture is noted L2. The fracture extends into the more inferior aspects 7 vertebral body than on the prior exam. No significant collapse is evident on supine imaging. There is no significant retropulsion of bone. Paraspinal and other soft tissues: Calcifications are again seen within an enlarged spleen. Marked renal atrophy is noted bilaterally. Atherosclerotic changes are present in the aorta and branch vessels. No significant adenopathy is present. Disc levels: T12-L1: Mild facet hypertrophy and leftward disc bulging is present without significant stenosis. L1-2: A leftward disc bulging and bilateral facet hypertrophy is again noted. Disc bulging is slightly worse than on the prior study. Mild left subarticular and foraminal narrowing is evident. L2-3:  Mild facet hypertrophy is noted bilaterally. L3-4: Moderate facet hypertrophy is present bilaterally. A leftward disc bulge is noted. There is no significant interval change. L4-5: Advanced facet hypertrophy is present. A broad-based disc protrusion is again seen. Severe central and moderate bilateral foraminal stenosis is evident bilaterally. L5-S1: Moderate facet spurring is present bilaterally. There is no significant stenosis. IMPRESSION: 1. Progressive superior endplate compression fracture at L2. Although there is no significant retropulsion of bone, disc disease is worse on the left, leading to left subarticular and foraminal narrowing at L1-2. 2. No significant progression in loss of height at L2-1 supine images. Upright images may demonstrate ossified. 3. Severe central and moderate bilateral foraminal stenosis at L4-5. 4. Multilevel facet degenerative change. 5.  Aortic Atherosclerosis (ICD10-I70.0). Electronically Signed   By: San Morelle M.D.   On: 09/04/2016 11:07   Ct Pelvis Wo Contrast  Result Date: 09/04/2016 CLINICAL DATA:  Pelvic pain, low back pain,  BILATERAL hip pain, fell 3-4 weeks ago, worsening low back pain after mechanical fall EXAM: CT PELVIS WITHOUT CONTRAST TECHNIQUE: Multidetector CT imaging of the pelvis was performed following the standard protocol without intravenous contrast. Sagittal and coronal MPR images reconstructed from axial data set. COMPARISON:  RIGHT hip CT 08/07/2016 FINDINGS: Urinary Tract: Visualize distal ureters and urinary bladder unremarkable Bowel: Sigmoid diverticulosis without evidence of diverticulitis. Normal appendix. Remaining visualized bowel loops unremarkable Vascular/Lymphatic: Atherosclerotic calcifications throughout the iliac and femoral arteries bilaterally. Scattered pelvic phleboliths Reproductive: Uterus surgically absent. Nonvisualization of ovaries. Other: Minimal amount of nonspecific free fluid. No free air or inflammatory process Musculoskeletal: Diffuse osseous demineralization. Minimal degenerative changes at pubic symphysis and RIGHT hip joint. Significant degenerative facet disease changes at visualized lower lumbar spine. Multifactorial spinal stenosis at L4-L5, present since 2011. No fracture or bone destruction seen. IMPRESSION: Osseous demineralization with mild degenerative changes of the RIGHT hip joint  with more advanced facet degenerative changes at the visualized lower lumbar spine. Multifactorial spinal stenosis at L4-L5. No acute bony abnormalities. Sigmoid diverticulosis. Minimal nonspecific free pelvic fluid. Electronically Signed   By: Lavonia Dana M.D.   On: 09/04/2016 10:53        Scheduled Meds: . amiodarone  200 mg Oral Daily  . carvedilol  25 mg Oral BID  . escitalopram  10 mg Oral Daily  . gabapentin  300 mg Oral QHS  . insulin aspart  0-15 Units Subcutaneous TID WC  . levothyroxine  25 mcg Oral QAC breakfast  . LORazepam  0.5 mg Oral QHS   Continuous Infusions: . cefTRIAXone (ROCEPHIN)  IV Stopped (09/04/16 1921)     LOS: 1 day    Jawanza Zambito Tanna Furry, MD [facility] Pager [phone]  If 7PM-7AM, please contact night-coverage [email or web address] Password [facility] 09/05/2016, 3:23 PM

## 2016-09-05 NOTE — Progress Notes (Signed)
lab called in critical lab values of PT 43.6 & INR 4.46. MD page notified. Will continue to monitor.

## 2016-09-05 NOTE — Progress Notes (Signed)
Patient getting bone scan to determine candidacy for a VP/KP.  Her urine CX reveals a UTI with >100,000 colonies of gram negative rods.  We can not proceed with a procedure until she has been treated for several days.  At this point, Monday would be the earliest we could potentially proceed if she is a candidate.  If the primary service feels she requires inpatient hospitalization for pain control, then she would require admission until next week.  If her pain is better controlled, then she could follow up with [place] as an outpatient for evaluation and management.  We will follow her results and care.  Oberia Beaudoin E 3:11 PM 09/05/2016

## 2016-09-05 NOTE — Progress Notes (Signed)
ANTICOAGULATION CONSULT NOTE - Follow Up Consult  Pharmacy Consult for Coumadin Indication: atrial fibrillation  Allergies  Allergen Reactions  . Sulfonamide Derivatives Other (See Comments)    Reaction unknown; "childhood allergy/mother"    Patient Measurements: Height: 5' (152.4 cm) Weight: 223 lb (101.2 kg) IBW/kg (Calculated) : 45.5  Vital Signs: Temp: 98.7 F (37.1 C) (04/25 1003) Temp Source: Oral (04/25 1003) BP: 106/33 (04/25 1003) Pulse Rate: 69 (04/25 1003)  Labs:  Recent Labs  09/04/16 1044 09/04/16 1608 09/05/16 0857  HGB 10.6*  --  11.2*  HCT 33.1*  --  34.0*  PLT 71*  --  73*  APTT  --  102*  --   LABPROT  --  46.0* 43.6*  INR  --  4.76* 4.46*  CREATININE 2.75*  --  3.02*    Estimated Creatinine Clearance: 17.8 mL/min (A) (by C-G formula based on SCr of 3.02 mg/dL (H)).   Medications:  Scheduled:  . amiodarone  200 mg Oral Daily  . carvedilol  25 mg Oral BID  . escitalopram  10 mg Oral Daily  . gabapentin  300 mg Oral QHS  . insulin aspart  0-15 Units Subcutaneous TID WC  . levothyroxine  25 mcg Oral QAC breakfast  . LORazepam  0.5 mg Oral QHS    Assessment: 76yo female with AFib, on Coumadin pta.  Pt admitted due to back pain and may require surgery.  INR is supratherapeutic, last dose of Coumadin on 4/24 pta.  Pharmacy to coordinate with IR as to when to resume Coumadin, but currently INR preventing this.  Goal of Therapy:  INR 2-3 Monitor platelets by anticoagulation protocol: Yes   Plan:  Daily INR F/U plans for surgery vs resume Coumadin when INR allows  Gracy Bruins, [facility]

## 2016-09-06 ENCOUNTER — Ambulatory Visit (INDEPENDENT_AMBULATORY_CARE_PROVIDER_SITE_OTHER): Payer: Medicare Other

## 2016-09-06 DIAGNOSIS — I5022 Chronic systolic (congestive) heart failure: Secondary | ICD-10-CM | POA: Diagnosis not present

## 2016-09-06 DIAGNOSIS — Z95810 Presence of automatic (implantable) cardiac defibrillator: Secondary | ICD-10-CM | POA: Diagnosis not present

## 2016-09-06 LAB — GLUCOSE, CAPILLARY
GLUCOSE-CAPILLARY: 168 mg/dL — AB (ref 65–99)
GLUCOSE-CAPILLARY: 218 mg/dL — AB (ref 65–99)
Glucose-Capillary: 147 mg/dL — ABNORMAL HIGH (ref 65–99)

## 2016-09-06 LAB — URINE CULTURE

## 2016-09-06 LAB — PROTIME-INR
INR: 3.54
Prothrombin Time: 36.3 seconds — ABNORMAL HIGH (ref 11.4–15.2)

## 2016-09-06 MED ORDER — CEPHALEXIN 250 MG PO CAPS: 250.0000 mg | ORAL_CAPSULE | Freq: Two times a day (BID) | ORAL | 0 refills | Status: AC

## 2016-09-06 MED ORDER — OXYCODONE-ACETAMINOPHEN 5-325 MG PO TABS: 1.0000 | ORAL_TABLET | Freq: Three times a day (TID) | ORAL | 0 refills | Status: DC | PRN

## 2016-09-06 NOTE — Care Management Note (Signed)
Case Management Note  Patient Details  Name: Destiny [name] MRN: [id number] Date of Birth: 16-Feb-1941  Subjective/Objective:                    Action/Plan: Pt discharging home with orders for [facility] services. CM met with the patient and her husband and provided them a list of [place] agencies. They selected [facility]. Brad with [facility] notified and accepted the referral.   Husband will provide transportation home. Pt is oxygen dependent and they use [employer] for her supplies and oxygen. Husband states he has oxygen tank in car for trip home.   Husband states he feels they have all needed DME at home.   Expected Discharge Date:  09/06/16               Expected Discharge Plan:  [place]  In-House Referral:  NA  Discharge planning Services  CM Consult  Post Acute Care Choice:  Home Health Choice offered to:  Patient, Spouse  DME Arranged:    DME Agency:     HH Arranged:  RN, PT, OT, Nurse's Aide [place] Agency:  [place]  Status of Service:  Completed, signed off  If discussed at [place] of Stay Meetings, dates discussed:    Additional Comments:  Pollie Friar, RN 09/06/2016, 11:32 AM

## 2016-09-06 NOTE — Evaluation (Signed)
Physical Therapy Evaluation Patient Details Name: Destiny [name] MRN: [id number] DOB: 07-08-1940 Today's Date: 09/06/2016   History of Present Illness  Pt is a 76 y/o female admitted secondary to intractable back pain. Pt found to have an L2 compression fx. Plan is for pt to follow as an outpatient for kyphoplasty. PMH including but not limited to COPD (on 2L of O2 at all times), AICD, CAD s/p CABG x4 in 2003, CHF, CKD, COPD, DM, HTN and PAF.  Clinical Impression  Pt presented sitting EOB, awake and willing to participate in therapy session. Prior to admission, pt reported that she ambulates with use of RW, dependent on supplemental O2 (2L at all times) and requires assistance from her husband with LB dressing and bathing. Pt ambulated within the room with use of RW and min guard. Pt ambulated on 2L of O2 with SPO2 decreasing to low 80's. Pt required sitting rest break x1 minute with instruction in pursed-lip breathing for O2 to recover to low 90's. Pt would continue to benefit from skilled physical therapy services via HHPT to address the below listed limitations in order to improve overall safety and independence with functional mobility.     Follow Up Recommendations Home health PT    Equipment Recommendations  None recommended by PT;Other (comment) (pt has all necessary DME at home)    Recommendations for Other Services       Precautions / Restrictions Precautions Precautions: Fall Precaution Comments: reviewed back precautions with patient - OT provided back precautions handout Restrictions Weight Bearing Restrictions: No      Mobility  Bed Mobility               General bed mobility comments: pt sitting EOB with PT entered room  Transfers Overall transfer level: Needs assistance Equipment used: Rolling walker (2 wheeled) Transfers: Sit to/from Stand Sit to Stand: Min guard         General transfer comment: increased time, VC'ing for bilateral hand placement  (although pt with preference to pull up on RW with bilateral UEs), min guard for safety  Ambulation/Gait Ambulation/Gait assistance: Min guard Ambulation Distance (Feet): 15 Feet Assistive device: Rolling walker (2 wheeled) Gait Pattern/deviations: Step-to pattern;Step-through pattern;Decreased step length - right;Decreased step length - left;Decreased stride length;Shuffle Gait velocity: decreased Gait velocity interpretation: Below normal speed for age/gender General Gait Details: very slow cadence, mild instability but no LOB or need for physical assistance, min guard for safety. Pt ambulated on 2L of O2 with SPO2 decreasing to low 80's, with quick recovery to low 90's with sitting rest break of approximately one minute  Stairs            Wheelchair Mobility    Modified Rankin (Stroke Patients Only)       Balance Overall balance assessment: Needs assistance Sitting-balance support: Feet supported Sitting balance-Leahy Scale: Fair     Standing balance support: During functional activity;Bilateral upper extremity supported Standing balance-Leahy Scale: Poor Standing balance comment: pt reliant on bilateral UEs on RW                             Pertinent Vitals/Pain Pain Assessment: 0-10 Pain Score: 7  Pain Location: lower back and bilateral LEs Pain Descriptors / Indicators: Throbbing;Sore Pain Intervention(s): Monitored during session;Repositioned    Home Living Family/patient expects to be discharged to:: Private residence Living Arrangements: Spouse/significant other Available Help at Discharge: Family;Available 24 hours/day Type of Home: House Home Access:  Ramped entrance     Home Layout: One level Home Equipment: Clinical cytogeneticist - 4 wheels;Bedside commode;Grab bars - toilet;Grab bars - tub/shower;Transport chair;Hand held shower head      Prior Function Level of Independence: Needs assistance   Gait / Transfers Assistance Needed: ambulates  with use of RW  ADL's / Homemaking Assistance Needed: husband assists with LB dressing and bathing        Hand Dominance   Dominant Hand: Right    Extremity/Trunk Assessment   Upper Extremity Assessment Upper Extremity Assessment: Defer to OT evaluation    Lower Extremity Assessment Lower Extremity Assessment: Generalized weakness       Communication   Communication: No difficulties  Cognition Arousal/Alertness: Awake/alert Behavior During Therapy: WFL for tasks assessed/performed Overall Cognitive Status: Within Functional Limits for tasks assessed                                        General Comments      Exercises     Assessment/Plan    PT Assessment All further PT needs can be met in the next venue of care  PT Problem List Decreased activity tolerance;Decreased balance;Decreased mobility;Decreased coordination;Decreased knowledge of use of DME;Decreased safety awareness;Decreased knowledge of precautions;Cardiopulmonary status limiting activity;Pain       PT Treatment Interventions      PT Goals (Current goals can be found in the Care Plan section)  Acute Rehab PT Goals Patient Stated Goal: return home, decrease pain    Frequency     Barriers to discharge        Co-evaluation PT/OT/SLP Co-Evaluation/Treatment: Yes Reason for Co-Treatment: For patient/therapist safety;To address functional/ADL transfers PT goals addressed during session: Mobility/safety with mobility;Balance;Proper use of DME         End of Session Equipment Utilized During Treatment: Gait belt;Oxygen Activity Tolerance: Patient limited by fatigue Patient left: in chair;with call bell/phone within reach;with family/visitor present Nurse Communication: Mobility status PT Visit Diagnosis: Other abnormalities of gait and mobility (R26.89);Pain Pain - part of body:  (back)    Time: 1450-1519 PT Time Calculation (min) (ACUTE ONLY): 29 min   Charges:   PT  Evaluation $PT Eval Moderate Complexity: 1 Procedure     PT G Codes:        Sherie Don, PT, DPT [place] 09/06/2016, 3:45 PM

## 2016-09-06 NOTE — Evaluation (Signed)
Occupational Therapy Evaluation Patient Details Name: Destiny [name] MRN: [id number] DOB: 12/04/1940 Today's Date: 09/06/2016    History of Present Illness Pt is a 76 y/o female admitted secondary to intractable back pain. Pt found to have an L2 compression fx. Plan is for pt to follow as an outpatient for kyphoplasty. PMH including but not limited to COPD (on 2L of O2 at all times), AICD, CAD s/p CABG x4 in 2003, CHF, CKD, COPD, DM, HTN and PAF.   Clinical Impression   PTA Pt mod A for ADL and mobility with RW. Pt currently with increased pain, but functioning at baseline. Pt plans to follow up with MD on Monday for potential surgical intervention. No OT needs at this time. Pt and husband with no questions at the end of session, they have appropriate DME at home. OT to sign off at this time, thank you for this referral.     Follow Up Recommendations  No OT follow up;Supervision - Intermittent    Equipment Recommendations  None recommended by OT (Pt has appropriate DME)    Recommendations for Other Services       Precautions / Restrictions Precautions Precautions: Fall Precaution Comments: reviewed back precautions with patient - provided back precautions handout to educate for pain management Restrictions Weight Bearing Restrictions: No      Mobility Bed Mobility               General bed mobility comments: pt sitting EOB with PT entered room  Transfers Overall transfer level: Needs assistance Equipment used: Rolling walker (2 wheeled) Transfers: Sit to/from Stand Sit to Stand: Min guard         General transfer comment: increased time, VC'ing for bilateral hand placement (although pt with preference to pull up on RW with bilateral UEs), min guard for safety    Balance Overall balance assessment: Needs assistance Sitting-balance support: Feet supported Sitting balance-Leahy Scale: Fair     Standing balance support: During functional activity;Bilateral upper  extremity supported Standing balance-Leahy Scale: Poor Standing balance comment: pt reliant on bilateral UEs on RW                           ADL either performed or assessed with clinical judgement   ADL Overall ADL's : At baseline                                       General ADL Comments: increased pain, but Pt states that she is able to perform like before and was already getting help from her husband for LB bathing and dressing     Vision Baseline Vision/History: Wears glasses Wears Glasses: At all times Patient Visual Report: No change from baseline Vision Assessment?: No apparent visual deficits     Perception     Praxis      Pertinent Vitals/Pain Pain Assessment: 0-10 Pain Score: 7  Pain Location: lower back and bilateral LEs Pain Descriptors / Indicators: Throbbing;Sore Pain Intervention(s): Monitored during session;Repositioned     Hand Dominance Right   Extremity/Trunk Assessment Upper Extremity Assessment Upper Extremity Assessment: Generalized weakness   Lower Extremity Assessment Lower Extremity Assessment: Defer to PT evaluation       Communication Communication Communication: No difficulties   Cognition Arousal/Alertness: Awake/alert Behavior During Therapy: WFL for tasks assessed/performed Overall Cognitive Status: Within Functional Limits for tasks assessed  General Comments       Exercises     Shoulder Instructions      Home Living Family/patient expects to be discharged to:: Private residence Living Arrangements: Spouse/significant other Available Help at Discharge: Family;Available 24 hours/day Type of Home: House Home Access: Ramped entrance     Home Layout: One level     Bathroom Shower/Tub: Occupational psychologist: Handicapped height     Home Equipment: Clinical cytogeneticist - 4 wheels;Bedside commode;Grab bars - toilet;Grab bars -  tub/shower;Transport chair;Hand held shower head          Prior Functioning/Environment Level of Independence: Needs assistance  Gait / Transfers Assistance Needed: ambulates with use of RW ADL's / Homemaking Assistance Needed: husband assists with LB dressing and bathing            OT Problem List: Decreased activity tolerance;Pain;Decreased safety awareness      OT Treatment/Interventions:      OT Goals(Current goals can be found in the care plan section) Acute Rehab OT Goals Patient Stated Goal: return home, decrease pain OT Goal Formulation: With patient Time For Goal Achievement: 09/20/16 Potential to Achieve Goals: Good  OT Frequency:     Barriers to D/C:            Co-evaluation PT/OT/SLP Co-Evaluation/Treatment: Yes Reason for Co-Treatment: For patient/therapist safety;To address functional/ADL transfers PT goals addressed during session: Mobility/safety with mobility;Balance;Proper use of DME OT goals addressed during session: ADL's and self-care      End of Session Equipment Utilized During Treatment: Gait belt;Rolling walker;Oxygen Nurse Communication: Mobility status;Precautions  Activity Tolerance: Patient tolerated treatment well Patient left: in chair;with call bell/phone within reach;with family/visitor present  OT Visit Diagnosis: Unsteadiness on feet (R26.81);Muscle weakness (generalized) (M62.81);Pain Pain - Right/Left:  (central) Pain - part of body:  (back)                Time: 5697-9480 OT Time Calculation (min): 23 min Charges:  OT General Charges $OT Visit: 1 Procedure OT Evaluation $OT Eval Low Complexity: 1 Procedure G-Codes:     Hulda Humphrey OTR/L [facility] 09/06/2016, 4:31 PM

## 2016-09-06 NOTE — Progress Notes (Signed)
DC instructions completed with patient and husband. Discharge scripts and AVS given to husband. Discharged via wheelchair using home portable oxygen.

## 2016-09-06 NOTE — Discharge Summary (Addendum)
Physician Discharge Summary  Destiny [name] LZJ:[id number] DOB: 1941-02-03 DOA: 09/04/2016  PCP: Penni Homans, MD  Admit date: 09/04/2016 Discharge date: 09/06/2016  Admitted From:home Disposition:home  Recommendations for Outpatient Follow-up:  1. Follow up with PCP in 1-2 weeks 2. Please obtain BMP/CBC in one week  Home Health:yes Equipment/Devices:no Discharge Condition:stable CODE STATUS:full Diet recommendation:heart healthy/carb modified.  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 Patient had fall on March 2017 when she was evaluated in the ER found to have compression fracture at that time she has discharge with outpatient PCP follow-up. Patient reported that she was evaluated by PCP and was referred to interventional radiology for possible intervention. The pain became so severe that she could not wait therefore decided to come to ER. In the ER imaging studies consistent with compression fracture of L2. UA concerning for UTI, IR was consulted and admitted for further evaluation.  # Intractable back pain due to compression fracture of body of thoracic vertebra [facility]): Evaluated by IR, s/p bone scan. Evaluated by interventional radiology, unable to do kyphoplasty now because of UTI and elevated INR. The patient's pain is improved with oxycodone as needed. She is able to ambulate. Home care services ordered on discharge. Patient will be followed up by IR for kyphoplasty as outpatient. I recommended patient and her husband  to call to confirm the appointment and time. They verbalized understanding. I also discussed with Kacie from IR regarding the discharge plan and follow ups.  #Paroxysmal atrial fibrillation with supratherapeutic INR: I recommended to hold Coumadin until Monday when patient might have kyphoplasty. I recommended to resume Coumadin after the procedure. Patient and family verbalized understanding. No sign of bleeding. Monitor heart rate.  On Coreg and amiodarone.  #Diabetes: Continue current insulin regimen. Monitor blood sugar level.  #Urinary tract infection, likely acute cystitis without hematuria: Urine culture growing Escherichia coli. Discharging with 4 more days of Keflex. Clinically improved.  #Hypothyroidism: Continue Synthroid.  #COPD with chronic hypoxic respiratory failure: Continue oxygen, breathing treatment. Recommended outpatient follow-up with pulmonologist.  # Thrombocytopenia: Stable platelet count. Patient has history of Hodgkin's lymphoma and follow-up with hematologist/oncologist outpatient. Continue to monitor.  Patient is clinically improved. Pain medications ordered. Antibiotics for UTI. Outpatient follow-up with IR for the procedure.   Discharge Diagnoses:  Active Problems:   Compression fracture of body of thoracic vertebra (HCC)   Back pain   Closed compression fracture of L2 lumbar vertebra (HCC)   Thrombocytopenia (HCC)    Discharge Instructions  Discharge Instructions    Call MD for:  difficulty breathing, headache or visual disturbances    Complete by:  As directed    Call MD for:  extreme fatigue    Complete by:  As directed    Call MD for:  hives    Complete by:  As directed    Call MD for:  persistant dizziness or light-headedness    Complete by:  As directed    Call MD for:  persistant nausea and vomiting    Complete by:  As directed    Call MD for:  severe uncontrolled pain    Complete by:  As directed    Call MD for:  temperature >100.4     Complete by:  As directed    Diet - low sodium heart healthy    Complete by:  As directed    Discharge instructions    Complete by:  As directed    Please hold coumadin until Monday 09/10/2016. Please call interventional radiology to confirm the date and timing of the procedure.   Increase activity slowly    Complete by:  As directed      Allergies as of 09/06/2016      Reactions   Sulfonamide Derivatives Other (See Comments)   Reaction unknown; "childhood allergy/mother"      Medication List    TAKE these medications   acetaminophen 500 MG tablet Commonly known as:  TYLENOL Take 1,000 mg by mouth 2 (two) times daily as needed for mild pain.   albuterol 108 (90 Base) MCG/ACT inhaler Commonly known as:  PROVENTIL HFA;VENTOLIN HFA Inhale 2 puffs into the lungs every 6 (six) hours as needed for wheezing or shortness of breath.   amiodarone 200 MG tablet Commonly known as:  PACERONE Take 200 mg by mouth daily.   carvedilol 25 MG tablet Commonly known as:  COREG Take 1 tablet (25 mg total) by mouth 2 (two) times daily.   cephALEXin 250 MG capsule Commonly known as:  KEFLEX Take 1 capsule (250 mg total) by mouth 2 (two) times daily.   escitalopram 10 MG tablet Commonly known as:  LEXAPRO Take 10 mg by mouth daily.   fluticasone 50 MCG/ACT nasal spray Commonly known as:  FLONASE Place 2 sprays into both nostrils daily as needed for allergies or rhinitis. As needed for nasal stuffiness. What changed:  additional instructions   gabapentin 300 MG capsule Commonly known as:  NEURONTIN Take 300 mg by mouth at bedtime.   glucose blood test strip Commonly known as:  FREESTYLE LITE DX: 250.60  Check sugars bid and as needed What changed:  how much to take  how to take this  when to take this  additional instructions   HUMULIN R 250 units/2.85mL (100 units/mL) injection Generic drug:  insulin regular Inject 5 Units into the skin daily before breakfast.   insulin  NPH Human 100 UNIT/ML injection Commonly known as:  HUMULIN N,NOVOLIN N Inject 7-40 Units into the skin See admin instructions. 40 units in the morning before breakfast then 7 units in the evening before supper/evening meal   levothyroxine 25 MCG tablet Commonly known as:  SYNTHROID, LEVOTHROID Take 25 mcg by mouth daily before breakfast.   LORazepam 0.5 MG tablet Commonly known as:  ATIVAN Take 1 tablet (0.5 mg total) by mouth at bedtime.   oxyCODONE-acetaminophen 5-325 MG tablet Commonly known as:  PERCOCET/ROXICET Take 1 tablet by mouth every 8 (eight) hours as needed for severe pain. What changed:  when to take this   OXYGEN Inhale 3 L into the lungs continuous.   torsemide 20 MG tablet Commonly known as:  DEMADEX Take 20-40 mg by mouth See admin instructions. 40 mg in the morning on Sun/Fri/Sat and 20 mg on Mon/Tues/Wed/Thurs   warfarin 5 MG tablet Commonly known as:  COUMADIN TAKE AS DIRECTED BY ANTICOAGULATION CLINIC What changed:  See the new instructions.      Follow-up Information    Penni Homans, MD. Schedule an appointment as soon as possible for a visit in 1 week(s).   Specialty:  Family Medicine Contact information: [address]  [place] [address] [phone]        Corrie Mckusick, DO Follow up on 09/10/2016.   Specialty:  Interventional Radiology Why:  please call the office to confirm the appointment and time Contact information: [address]  [place] [address] [phone]          Allergies  Allergen Reactions  . Sulfonamide Derivatives Other (See Comments)    Reaction unknown; "childhood allergy/mother"    Consultations: IR  Procedures/Studies: Bone scan  Subjective: Patient was seen and examined at bedside. Reported pain is better with the medications. Able to ambulate. Denied fever, chills, nausea, vomiting, chest pain or shortness of breath. Husband at bedside.  Discharge Exam: Vitals:    09/06/16 0506 09/06/16 1032  BP: (!) 104/36 (!) 97/36  Pulse: 70 73  Resp: 20 18  Temp: 98.2 F (36.8 C) 98.1 F (36.7 C)   Vitals:   09/05/16 2126 09/06/16 0059 09/06/16 0506 09/06/16 1032  BP: (!) 120/34 (!) 119/37 (!) 104/36 (!) 97/36  Pulse: 72 73 70 73  Resp: 20 20 20 18   Temp: 98.2 F (36.8 C) 98.3 F (36.8 C) 98.2 F (36.8 C) 98.1 F (36.7 C)  TempSrc: Oral Axillary Oral Oral  SpO2: 97% 96% 96% 97%  Weight:      Height:        General: Pt is alert, awake, not in acute distress Cardiovascular: RRR, S1/S2 +, no rubs, no gallops Respiratory: CTA bilaterally, no wheezing, no rhonchi Abdominal: Soft, NT, ND, bowel sounds + Extremities: no edema, no cyanosis    The results of significant diagnostics from this hospitalization (including imaging, microbiology, ancillary and laboratory) are listed below for reference.     Microbiology: Recent Results (from the past 240 hour(s))  Urine culture     Status: Abnormal   Collection Time: 09/04/16  9:23 AM  Result Value Ref Range Status   Specimen Description URINE, CLEAN CATCH  Final   Special Requests NONE  Final   Culture >=100,000 COLONIES/mL ESCHERICHIA COLI (A)  Final   Report Status 09/06/2016 FINAL  Final   Organism ID, Bacteria ESCHERICHIA COLI (A)  Final      Susceptibility   Escherichia coli - MIC*    AMPICILLIN <=2 SENSITIVE Sensitive     CEFAZOLIN <=4 SENSITIVE Sensitive     CEFTRIAXONE <=1 SENSITIVE Sensitive     CIPROFLOXACIN <=0.25 SENSITIVE Sensitive     GENTAMICIN <=1 SENSITIVE Sensitive     IMIPENEM <=0.25 SENSITIVE Sensitive     NITROFURANTOIN <=16 SENSITIVE Sensitive     TRIMETH/SULFA <=20 SENSITIVE Sensitive     AMPICILLIN/SULBACTAM <=2 SENSITIVE Sensitive     PIP/TAZO <=4 SENSITIVE Sensitive     Extended ESBL NEGATIVE Sensitive     * >=100,000 COLONIES/mL ESCHERICHIA COLI  Blood culture (routine x 2)     Status: None (Preliminary result)   Collection Time: 09/04/16  6:55 PM  Result Value Ref  Range Status   Specimen Description BLOOD LEFT ANTECUBITAL  Final   Special Requests   Final    BOTTLES DRAWN AEROBIC ONLY Blood Culture adequate volume PATIENT ON FOLLOWING ROCEPHIN   Culture NO GROWTH < 24 HOURS  Final   Report Status PENDING  Incomplete  Blood culture (routine x 2)     Status: None (Preliminary result)   Collection Time: 09/04/16  7:05 PM  Result Value Ref Range Status   Specimen Description BLOOD LEFT HAND  Final   Special Requests   Final  IN PEDIATRIC BOTTLE Blood Culture adequate volume PATIENT ON FOLLOWING ROCEPHIN   Culture NO GROWTH < 24 HOURS  Final   Report Status PENDING  Incomplete     Labs: BNP (last 3 results)  Recent Labs  12/05/15 1144 12/15/15 0423 01/01/16 1110  BNP 655.1* 758.8* 211.9*   Basic Metabolic Panel:  Recent Labs Lab 09/04/16 1044 09/05/16 0857  NA 136 138  K 3.7 4.2  CL 101 102  CO2 29 27  GLUCOSE 130* 163*  BUN 36* 35*  CREATININE 2.75* 3.02*  CALCIUM 9.7 9.5   Liver Function Tests:  Recent Labs Lab 09/04/16 1044 09/05/16 0857  AST 35 34  ALT 20 19  ALKPHOS 117 110  BILITOT 0.8 1.3*  PROT 5.5* 5.6*  ALBUMIN 2.8* 2.7*    Recent Labs Lab 09/04/16 1044 09/05/16 0857  LIPASE 194* 146*   No results for input(s): AMMONIA in the last 168 hours. CBC:  Recent Labs Lab 09/04/16 1044 09/05/16 0857  WBC 2.5* 2.8*  NEUTROABS 1.2* 1.5*  HGB 10.6* 11.2*  HCT 33.1* 34.0*  MCV 94.8 96.3  PLT 71* 73*   Cardiac Enzymes: No results for input(s): CKTOTAL, CKMB, CKMBINDEX, TROPONINI in the last 168 hours. BNP: Invalid input(s): POCBNP CBG:  Recent Labs Lab 09/05/16 0634 09/05/16 1201 09/05/16 1703 09/06/16 0056 09/06/16 0611  GLUCAP 76 190* 163* 147* 168*   D-Dimer No results for input(s): DDIMER in the last 72 hours. Hgb A1c No results for input(s): HGBA1C in the last 72 hours. Lipid Profile  Recent Labs  09/05/16 0857  CHOL 154  HDL 21*  LDLCALC 88  TRIG 226*  CHOLHDL 7.3   Thyroid  function studies No results for input(s): TSH, T4TOTAL, T3FREE, THYROIDAB in the last 72 hours.  Invalid input(s): FREET3 Anemia work up No results for input(s): VITAMINB12, FOLATE, FERRITIN, TIBC, IRON, RETICCTPCT in the last 72 hours. Urinalysis    Component Value Date/Time   COLORURINE YELLOW 09/04/2016 0923   APPEARANCEUR CLEAR 09/04/2016 0923   LABSPEC 1.011 09/04/2016 0923   PHURINE 5.0 09/04/2016 0923   GLUCOSEU NEGATIVE 09/04/2016 0923   GLUCOSEU NEGATIVE 06/25/2016 1002   HGBUR NEGATIVE 09/04/2016 0923   HGBUR large 05/17/2008 1325   BILIRUBINUR NEGATIVE 09/04/2016 0923   KETONESUR NEGATIVE 09/04/2016 0923   PROTEINUR NEGATIVE 09/04/2016 0923   UROBILINOGEN 0.2 06/25/2016 1002   NITRITE NEGATIVE 09/04/2016 0923   LEUKOCYTESUR MODERATE (A) 09/04/2016 0923   Sepsis Labs Invalid input(s): PROCALCITONIN,  WBC,  LACTICIDVEN Microbiology Recent Results (from the past 240 hour(s))  Urine culture     Status: Abnormal   Collection Time: 09/04/16  9:23 AM  Result Value Ref Range Status   Specimen Description URINE, CLEAN CATCH  Final   Special Requests NONE  Final   Culture >=100,000 COLONIES/mL ESCHERICHIA COLI (A)  Final   Report Status 09/06/2016 FINAL  Final   Organism ID, Bacteria ESCHERICHIA COLI (A)  Final      Susceptibility   Escherichia coli - MIC*    AMPICILLIN <=2 SENSITIVE Sensitive     CEFAZOLIN <=4 SENSITIVE Sensitive     CEFTRIAXONE <=1 SENSITIVE Sensitive     CIPROFLOXACIN <=0.25 SENSITIVE Sensitive     GENTAMICIN <=1 SENSITIVE Sensitive     IMIPENEM <=0.25 SENSITIVE Sensitive     NITROFURANTOIN <=16 SENSITIVE Sensitive     TRIMETH/SULFA <=20 SENSITIVE Sensitive     AMPICILLIN/SULBACTAM <=2 SENSITIVE Sensitive     PIP/TAZO <=4 SENSITIVE Sensitive     Extended ESBL  NEGATIVE Sensitive     * >=100,000 COLONIES/mL ESCHERICHIA COLI  Blood culture (routine x 2)     Status: None (Preliminary result)   Collection Time: 09/04/16  6:55 PM  Result Value Ref  Range Status   Specimen Description BLOOD LEFT ANTECUBITAL  Final   Special Requests   Final    BOTTLES DRAWN AEROBIC ONLY Blood Culture adequate volume PATIENT ON FOLLOWING ROCEPHIN   Culture NO GROWTH < 24 HOURS  Final   Report Status PENDING  Incomplete  Blood culture (routine x 2)     Status: None (Preliminary result)   Collection Time: 09/04/16  7:05 PM  Result Value Ref Range Status   Specimen Description BLOOD LEFT HAND  Final   Special Requests   Final    IN PEDIATRIC BOTTLE Blood Culture adequate volume PATIENT ON FOLLOWING ROCEPHIN   Culture NO GROWTH < 24 HOURS  Final   Report Status PENDING  Incomplete     Time coordinating discharge: 31 minutes  SIGNED:   Rosita Fire, MD  [facility] 09/06/2016, 10:51 AM  If 7PM-7AM, please contact night-coverage [email or web address] Password [email or web address]

## 2016-09-07 ENCOUNTER — Other Ambulatory Visit (HOSPITAL_COMMUNITY): Payer: Self-pay | Admitting: Interventional Radiology

## 2016-09-07 ENCOUNTER — Telehealth: Payer: Self-pay | Admitting: Behavioral Health

## 2016-09-07 DIAGNOSIS — Z95810 Presence of automatic (implantable) cardiac defibrillator: Secondary | ICD-10-CM | POA: Diagnosis not present

## 2016-09-07 DIAGNOSIS — J449 Chronic obstructive pulmonary disease, unspecified: Secondary | ICD-10-CM | POA: Diagnosis not present

## 2016-09-07 DIAGNOSIS — M4850XA Collapsed vertebra, not elsewhere classified, site unspecified, initial encounter for fracture: Principal | ICD-10-CM

## 2016-09-07 DIAGNOSIS — I480 Paroxysmal atrial fibrillation: Secondary | ICD-10-CM | POA: Diagnosis not present

## 2016-09-07 DIAGNOSIS — Z6841 Body Mass Index (BMI) 40.0 and over, adult: Secondary | ICD-10-CM | POA: Diagnosis not present

## 2016-09-07 DIAGNOSIS — Z9181 History of falling: Secondary | ICD-10-CM | POA: Diagnosis not present

## 2016-09-07 DIAGNOSIS — S32020D Wedge compression fracture of second lumbar vertebra, subsequent encounter for fracture with routine healing: Secondary | ICD-10-CM | POA: Diagnosis not present

## 2016-09-07 DIAGNOSIS — J9612 Chronic respiratory failure with hypercapnia: Secondary | ICD-10-CM | POA: Diagnosis not present

## 2016-09-07 DIAGNOSIS — Z951 Presence of aortocoronary bypass graft: Secondary | ICD-10-CM | POA: Diagnosis not present

## 2016-09-07 DIAGNOSIS — N184 Chronic kidney disease, stage 4 (severe): Secondary | ICD-10-CM | POA: Diagnosis not present

## 2016-09-07 DIAGNOSIS — J9611 Chronic respiratory failure with hypoxia: Secondary | ICD-10-CM | POA: Diagnosis not present

## 2016-09-07 DIAGNOSIS — Z9981 Dependence on supplemental oxygen: Secondary | ICD-10-CM | POA: Diagnosis not present

## 2016-09-07 DIAGNOSIS — Z8572 Personal history of non-Hodgkin lymphomas: Secondary | ICD-10-CM | POA: Diagnosis not present

## 2016-09-07 DIAGNOSIS — I5042 Chronic combined systolic (congestive) and diastolic (congestive) heart failure: Secondary | ICD-10-CM | POA: Diagnosis not present

## 2016-09-07 DIAGNOSIS — I2510 Atherosclerotic heart disease of native coronary artery without angina pectoris: Secondary | ICD-10-CM | POA: Diagnosis not present

## 2016-09-07 DIAGNOSIS — Z794 Long term (current) use of insulin: Secondary | ICD-10-CM | POA: Diagnosis not present

## 2016-09-07 DIAGNOSIS — Z7901 Long term (current) use of anticoagulants: Secondary | ICD-10-CM | POA: Diagnosis not present

## 2016-09-07 DIAGNOSIS — I130 Hypertensive heart and chronic kidney disease with heart failure and stage 1 through stage 4 chronic kidney disease, or unspecified chronic kidney disease: Secondary | ICD-10-CM | POA: Diagnosis not present

## 2016-09-07 DIAGNOSIS — S22000D Wedge compression fracture of unspecified thoracic vertebra, subsequent encounter for fracture with routine healing: Secondary | ICD-10-CM | POA: Diagnosis not present

## 2016-09-07 DIAGNOSIS — IMO0001 Reserved for inherently not codable concepts without codable children: Secondary | ICD-10-CM

## 2016-09-07 DIAGNOSIS — Z9221 Personal history of antineoplastic chemotherapy: Secondary | ICD-10-CM | POA: Diagnosis not present

## 2016-09-07 DIAGNOSIS — D696 Thrombocytopenia, unspecified: Secondary | ICD-10-CM | POA: Diagnosis not present

## 2016-09-07 DIAGNOSIS — E1122 Type 2 diabetes mellitus with diabetic chronic kidney disease: Secondary | ICD-10-CM | POA: Diagnosis not present

## 2016-09-07 NOTE — Telephone Encounter (Signed)
Transition Care Management Follow-up Telephone Call  PCP: Penni Homans, MD  Admit date: 09/04/2016 Discharge date: 09/06/2016  Admitted From:home Disposition:home  Recommendations for Outpatient Follow-up:  1. Follow up with PCP in 1-2 weeks 2. Please obtain BMP/CBC in one week  Home Health:yes Equipment/Devices:no Discharge Condition:stable   How have you been since you were released from the hospital? Patient stated, "I'm taking pain medication & sitting up in my chair; the pain has sub-sided; I slept good last night".   Do you understand why you were in the hospital? yes   Do you understand the discharge instructions? yes   Where were you discharged to? Home   Items Reviewed:  Medications reviewed: yes  Allergies reviewed: yes  Dietary changes reviewed: yes, heart healthy & carb-modified diet  Referrals reviewed: yes, Follow up with PCP in 1-2 weeks; Please obtain BMP/CBC in one week   Functional Questionnaire:   Activities of Daily Living (ADLs):   She states they are independent in the following: ambulation, bathing and hygiene, feeding, continence, grooming, toileting and dressing States they require assistance with the following: None   Any transportation issues/concerns?: no   Any patient concerns? no   Confirmed importance and date/time of follow-up visits scheduled yes, 09/18/16 at 11:15 AM  Provider Appointment booked with Dr. Charlett Blake.  Confirmed with patient if condition begins to worsen call PCP or go to the ER.  Patient was given the office number and encouraged to call back with question or concerns.  : yes

## 2016-09-07 NOTE — Progress Notes (Signed)
EPIC Encounter for ICM Monitoring  Patient Name: Destiny [name] is a 76 y.o. female Date: 09/07/2016 Primary Care Physican: Penni Homans, MD Primary Cardiologist: [place] Electrophysiologist: Lovena Le Nephrologist: [place] Dry Weight: Last known weight228lb  Bi-V Pacing: >99% Battery Longevity: 8.2 months      Heart Failure questions reviewed, pt asymptomatic for fluid.   Hospitalized for back pain and discharged 4/26.  She will be returning to the hospital on 09/10/2016 for back procedure but unsure when she will be discharged.     Thoracic impedance abnormal suggesting fluid accumulation in relation to hospitalization but starting to trend back toward baseline.  Prescribed: Torsemide 20-40 mg by mouth as directed. 2 tablets daily on Friday, Saturday & Sunday, 1 tablet daily Monday through Thursday.  Labs: 09/05/2016 Creatinine 3.02, BUN 35,    Potassium 4.2, Sodium 138, EGFR 14-17 09/04/2016 Creatinine 2.75, BUN 36,    Potassium 3.7, Sodium 136, EGFR 16-19  08/27/2016 Creatinine 3.02, BUN 42,    Potassium 4.7, Sodium 134  08/20/2016 Creatinine 3.1,   BUN 44.6, Potassium 4.4, Sodium 139  07/30/2016 Creatinine 3.4,   BUN 44,    Potassium 4.2, Sodium 137 05/21/2016 Creatinine 4.3,   BUN 58,    Potassium 4.4, Sodium 134 04/27/2016 Creatinine 3.73, BUN 44,    Potassium 4.8, Sodium 137, EGFR 12.62 04/23/2016 Creatinine 3.74, BUN 49,    Potassium 4.8, Sodium 137, EGFR 12.59  04/20/2016 Creatinine 3.43, BUN 48,    Potassium 4.7, Sodium 137, EGFR 13.91  04/19/2016 Creatinine 3.32, BUN 51,    Potassium 4.6, Sodium 137, EGFR 14.44  01/09/2016 Creatinine 2.33, BUN 61,    Potassium 4.2, Sodium 138  01/08/2016 Creatinine 2.42, BUN 62,    Potassium 4.3, Sodium 139  01/07/2016 Creatinine 2.75, BUN 63,    Potassium 4.7, Sodium 137  01/05/2016 Creatinine 2.62, BUN 65,    Potassium 4.6, Sodium 136  01/04/2016 Creatinine 2.58, BUN 60,    Potassium 4.6, Sodium 135  01/03/2016 Creatinine 2.47,  BUN 60,    Potassium 4.6, Sodium 135 01/02/2016 Creatinine 2.83, BUN 55,    Potassium 4.7, Sodium 140  01/01/2016 Creatinine 2.91, BUN 56,    Potassium 4.6, Sodium 137  12/17/2015 Creatinine 2.85, BUN 64,    Potassium 5.0, Sodium 139  12/16/2015 Creatinine 3.16, BUN 67,    Potassium 4.7, Sodium 137  12/15/2015 Creatinine 3.45, BUN 62,    Potassium 5.0, Sodium 136  12/14/2015 Creatinine 3.51, BUN 62,    Potassium 5.3, Sodium 133  12/05/2015 Creatinine 2.95, BUN 47,    Potassium 4.0, Sodium 138  07/14/2015 Creatinine 2.65, BUN 48,    Potassium 4.2, Sodium 140, EGFR 18.77  05/27/2015 Creatinine 3.00, BUN 53,    Potassium 4.3, Sodium 140  Recommendations: Advised patient to monitor for fluid symptoms after discharge to home since she will be given fluids during the back procedure.  Advised to call if she has fluid symptoms.   Follow-up plan: ICM clinic phone appointment on 09/17/2016 to recheck fluid levels.    Copy of ICM check sent to primary cardiologist and device physician.   3 month ICM trend: 09/07/2016      1 Year ICM trend:      [name], RN 09/07/2016 8:24 AM

## 2016-09-09 LAB — CULTURE, BLOOD (ROUTINE X 2)
CULTURE: NO GROWTH
Culture: NO GROWTH

## 2016-09-10 ENCOUNTER — Other Ambulatory Visit: Payer: Self-pay | Admitting: Radiology

## 2016-09-10 ENCOUNTER — Other Ambulatory Visit: Payer: Self-pay | Admitting: General Surgery

## 2016-09-10 ENCOUNTER — Encounter (HOSPITAL_COMMUNITY): Payer: Self-pay

## 2016-09-10 ENCOUNTER — Ambulatory Visit (HOSPITAL_COMMUNITY)
Admission: RE | Admit: 2016-09-10 | Discharge: 2016-09-10 | Disposition: A | Discharge status: Home / Self Care | Payer: Medicare Other | Source: Ambulatory Visit | Attending: Interventional Radiology | Admitting: Interventional Radiology

## 2016-09-10 DIAGNOSIS — IMO0001 Reserved for inherently not codable concepts without codable children: Secondary | ICD-10-CM

## 2016-09-10 DIAGNOSIS — E11319 Type 2 diabetes mellitus with unspecified diabetic retinopathy without macular edema: Secondary | ICD-10-CM | POA: Diagnosis not present

## 2016-09-10 DIAGNOSIS — S32029A Unspecified fracture of second lumbar vertebra, initial encounter for closed fracture: Secondary | ICD-10-CM | POA: Insufficient documentation

## 2016-09-10 DIAGNOSIS — E1140 Type 2 diabetes mellitus with diabetic neuropathy, unspecified: Secondary | ICD-10-CM | POA: Diagnosis not present

## 2016-09-10 DIAGNOSIS — Z794 Long term (current) use of insulin: Secondary | ICD-10-CM | POA: Diagnosis not present

## 2016-09-10 DIAGNOSIS — I2510 Atherosclerotic heart disease of native coronary artery without angina pectoris: Secondary | ICD-10-CM | POA: Diagnosis not present

## 2016-09-10 DIAGNOSIS — J449 Chronic obstructive pulmonary disease, unspecified: Secondary | ICD-10-CM | POA: Diagnosis not present

## 2016-09-10 DIAGNOSIS — Z539 Procedure and treatment not carried out, unspecified reason: Secondary | ICD-10-CM | POA: Insufficient documentation

## 2016-09-10 DIAGNOSIS — E039 Hypothyroidism, unspecified: Secondary | ICD-10-CM | POA: Insufficient documentation

## 2016-09-10 DIAGNOSIS — N184 Chronic kidney disease, stage 4 (severe): Secondary | ICD-10-CM | POA: Diagnosis not present

## 2016-09-10 DIAGNOSIS — Z951 Presence of aortocoronary bypass graft: Secondary | ICD-10-CM | POA: Insufficient documentation

## 2016-09-10 DIAGNOSIS — I447 Left bundle-branch block, unspecified: Secondary | ICD-10-CM | POA: Diagnosis not present

## 2016-09-10 DIAGNOSIS — N390 Urinary tract infection, site not specified: Secondary | ICD-10-CM | POA: Diagnosis not present

## 2016-09-10 DIAGNOSIS — I6523 Occlusion and stenosis of bilateral carotid arteries: Secondary | ICD-10-CM | POA: Diagnosis not present

## 2016-09-10 DIAGNOSIS — I255 Ischemic cardiomyopathy: Secondary | ICD-10-CM | POA: Insufficient documentation

## 2016-09-10 DIAGNOSIS — Z8249 Family history of ischemic heart disease and other diseases of the circulatory system: Secondary | ICD-10-CM | POA: Insufficient documentation

## 2016-09-10 DIAGNOSIS — Z6839 Body mass index (BMI) 39.0-39.9, adult: Secondary | ICD-10-CM | POA: Insufficient documentation

## 2016-09-10 DIAGNOSIS — Z7901 Long term (current) use of anticoagulants: Secondary | ICD-10-CM | POA: Insufficient documentation

## 2016-09-10 DIAGNOSIS — E1122 Type 2 diabetes mellitus with diabetic chronic kidney disease: Secondary | ICD-10-CM | POA: Diagnosis not present

## 2016-09-10 DIAGNOSIS — E559 Vitamin D deficiency, unspecified: Secondary | ICD-10-CM | POA: Insufficient documentation

## 2016-09-10 DIAGNOSIS — Y92009 Unspecified place in unspecified non-institutional (private) residence as the place of occurrence of the external cause: Secondary | ICD-10-CM | POA: Diagnosis not present

## 2016-09-10 DIAGNOSIS — E785 Hyperlipidemia, unspecified: Secondary | ICD-10-CM | POA: Insufficient documentation

## 2016-09-10 DIAGNOSIS — M4850XA Collapsed vertebra, not elsewhere classified, site unspecified, initial encounter for fracture: Secondary | ICD-10-CM

## 2016-09-10 DIAGNOSIS — I130 Hypertensive heart and chronic kidney disease with heart failure and stage 1 through stage 4 chronic kidney disease, or unspecified chronic kidney disease: Secondary | ICD-10-CM | POA: Insufficient documentation

## 2016-09-10 DIAGNOSIS — Z95810 Presence of automatic (implantable) cardiac defibrillator: Secondary | ICD-10-CM | POA: Insufficient documentation

## 2016-09-10 DIAGNOSIS — W1839XA Other fall on same level, initial encounter: Secondary | ICD-10-CM | POA: Diagnosis not present

## 2016-09-10 DIAGNOSIS — M109 Gout, unspecified: Secondary | ICD-10-CM | POA: Insufficient documentation

## 2016-09-10 DIAGNOSIS — Z9981 Dependence on supplemental oxygen: Secondary | ICD-10-CM | POA: Insufficient documentation

## 2016-09-10 DIAGNOSIS — G8929 Other chronic pain: Secondary | ICD-10-CM | POA: Insufficient documentation

## 2016-09-10 DIAGNOSIS — Z7951 Long term (current) use of inhaled steroids: Secondary | ICD-10-CM | POA: Insufficient documentation

## 2016-09-10 DIAGNOSIS — M4856XA Collapsed vertebra, not elsewhere classified, lumbar region, initial encounter for fracture: Secondary | ICD-10-CM | POA: Diagnosis not present

## 2016-09-10 DIAGNOSIS — I5042 Chronic combined systolic (congestive) and diastolic (congestive) heart failure: Secondary | ICD-10-CM | POA: Insufficient documentation

## 2016-09-10 DIAGNOSIS — Z882 Allergy status to sulfonamides status: Secondary | ICD-10-CM | POA: Insufficient documentation

## 2016-09-10 DIAGNOSIS — G4733 Obstructive sleep apnea (adult) (pediatric): Secondary | ICD-10-CM | POA: Insufficient documentation

## 2016-09-10 DIAGNOSIS — I480 Paroxysmal atrial fibrillation: Secondary | ICD-10-CM | POA: Diagnosis not present

## 2016-09-10 DIAGNOSIS — M545 Low back pain: Secondary | ICD-10-CM | POA: Diagnosis not present

## 2016-09-10 LAB — URINALYSIS, COMPLETE (UACMP) WITH MICROSCOPIC
Bilirubin Urine: NEGATIVE
GLUCOSE, UA: NEGATIVE mg/dL
HGB URINE DIPSTICK: NEGATIVE
Ketones, ur: NEGATIVE mg/dL
LEUKOCYTES UA: NEGATIVE
NITRITE: NEGATIVE
PROTEIN: NEGATIVE mg/dL
SPECIFIC GRAVITY, URINE: 1.018 (ref 1.005–1.030)
pH: 5 (ref 5.0–8.0)

## 2016-09-10 LAB — BASIC METABOLIC PANEL
ANION GAP: 6 (ref 5–15)
BUN: 31 mg/dL — AB (ref 6–20)
CHLORIDE: 102 mmol/L (ref 101–111)
CO2: 27 mmol/L (ref 22–32)
Calcium: 9.3 mg/dL (ref 8.9–10.3)
Creatinine, Ser: 2.32 mg/dL — ABNORMAL HIGH (ref 0.44–1.00)
GFR calc Af Amer: 23 mL/min — ABNORMAL LOW (ref >60)
GFR calc non Af Amer: 20 mL/min — ABNORMAL LOW (ref >60)
GLUCOSE: 124 mg/dL — AB (ref 65–99)
POTASSIUM: 4.9 mmol/L (ref 3.5–5.1)
Sodium: 135 mmol/L (ref 135–145)

## 2016-09-10 LAB — CBC
HEMATOCRIT: 30.7 % — AB (ref 36.0–46.0)
HEMOGLOBIN: 10.2 g/dL — AB (ref 12.0–15.0)
MCH: 31.6 pg (ref 26.0–34.0)
MCHC: 33.2 g/dL (ref 30.0–36.0)
MCV: 95 fL (ref 78.0–100.0)
Platelets: 67 10*3/uL — ABNORMAL LOW (ref 150–400)
RBC: 3.23 MIL/uL — ABNORMAL LOW (ref 3.87–5.11)
RDW: 17.2 % — AB (ref 11.5–15.5)
WBC: 3 10*3/uL — AB (ref 4.0–10.5)

## 2016-09-10 LAB — PROTIME-INR
INR: 1.42
Prothrombin Time: 17.4 seconds — ABNORMAL HIGH (ref 11.4–15.2)

## 2016-09-10 LAB — GLUCOSE, CAPILLARY: Glucose-Capillary: 99 mg/dL (ref 65–99)

## 2016-09-10 LAB — APTT: aPTT: 43 seconds — ABNORMAL HIGH (ref 24–36)

## 2016-09-10 MED ORDER — CEFAZOLIN SODIUM-DEXTROSE 2-4 GM/100ML-% IV SOLN: 2.0000 g | INTRAVENOUS | Status: DC

## 2016-09-10 MED ORDER — SODIUM CHLORIDE 0.9 % IV SOLN
INTRAVENOUS | Status: DC
  Administered 2016-09-10: 11:00:00 via INTRAVENOUS

## 2016-09-10 MED ORDER — CEFAZOLIN SODIUM-DEXTROSE 2-4 GM/100ML-% IV SOLN
INTRAVENOUS | Status: AC
  Filled 2016-09-10: qty 100

## 2016-09-10 NOTE — H&P (Signed)
Chief Complaint: Patient was seen in consultation today for lumbar 2 vertebroplasty/kyphoplasty at the request of Dr Katheran James  Referring Physician(s): Dr Katheran James  Supervising Physician: Luanne Bras  Patient Status: [facility] - Out-pt  History of Present Illness: Destiny [name] is a 76 y.o. female   Pt fell at home 08/04/16---fell while trying to pick up something from floor Was seen in ED 3/27 Xrays revealed L2 fracture and was released with pain meds and referral to Dr Estanislado Pandy Before pt could be seen she returned to ED 09/04/16 in severe pain and was admitted for pain control. Determined to have UTI and INR was 4.76 secondary coumadin meds. Referral/order for inpt Vertebroplasty/kyphoplasty--- INR slowly was trending down; could not safely perform procedure INR 3.5 4/26 at DC from hospital. Was determined best to DC and return when INR wnl and UTI had been treated Last dose antibiotics was today. Last coumadin was 4/25 Labs pending  NM study 4/25: IMPRESSION: Limited bone scan with SPECT demonstrates intense uptake at the L2 level compatible with acute/subacute compression fracture  Scheduled now for L2 VP/KP if UTI cleared and INR wnl   Past Medical History:  Diagnosis Date  . AICD (automatic cardioverter/defibrillator) present   . Anemia, unspecified 12/13/2013  . Back pain, chronic    "just when I walk; mass on 3rd and 4th vertebrae right lower back"  . Biventricular implantable cardiac defibrillator in situ 2007, 2012   a. 2007;  b. 2012 Gen change: SJM 3231-40 Uni BiV ICD, ser # [id number].  Marland Kitchen CAD (coronary artery disease)    a. 05/2001 CABG x4: LIMA->LAD, VG->D1, VG->D2, VG->RCA.  . Cardiomyopathy, ischemic    a. 2012 s/p SJM 3231-40 Uni BiV ICD, ser # [id number].  . Carotid stenosis    a. 10/19/2011 carotid duplex - Mild hard plaque bilaterally. Stable 40-59% bilateral ICA stenosis. Carotid US (04/2013):  Bilateral 40-59% ICA.  F/u 1 year  . Cerumen impaction  10/28/2015  . Chronic combined systolic and diastolic CHF (congestive heart failure) (HCC)    a. EF as low as 25% in 2006;  b. EF 60-65% in 12/2011;  c. 02/2013 Echo: EF 35-40, mod mid-dist antsept HK, Gr 2 DD, mild LVH.  . CKD (chronic kidney disease), stage IV ([place])    hx/notes 09/04/2016  . COPD (chronic obstructive pulmonary disease) ([place])   . COPD, severe ([place]) 01/22/2016  . 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 Dyslipidemia   . Gout ~ 08/2011  . Hypercalcemia 09/26/2013  . Hypertension   . Hypothyroidism   . Hypoxemia requiring supplemental oxygen   . LBBB (left bundle branch block)   . Lumbar compression fracture ([place])    L2/notes 09/04/2016  . Morbid obesity with BMI of 45.0-49.9, adult (HCC)    Ht. 5'4". BMI 47.2  . Mural thrombus of left ventricle    before 2003, while on Coumadin, No h/o CVA  . 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 Benay Spice, in remission  . On home oxygen therapy    "normally 2-3L; 24/7" (12/14/2015); "2L; 24/7" (09/04/2016)  . OSA on CPAP   . PAF (paroxysmal atrial fibrillation) (HCC)    on Coumadin  . Presence of permanent cardiac pacemaker   . Retinopathy due to secondary diabetes ([place])    type II, uncontrolled  . Urinary incontinence 06/25/2016  . Vertebral fracture, osteoporotic ([place]) 08/27/2016  . Vitamin D  deficiency 06/09/2012   historical    Past Surgical History:  Procedure Laterality Date  . ABDOMINAL HYSTERECTOMY  1982  . CARDIAC CATHETERIZATION  06/03/01  . CARDIOVERSION N/A 07/30/2014   Procedure: CARDIOVERSION;  Surgeon: Jolaine Artist, MD;  Location: [facility] ENDOSCOPY;  Service: Cardiovascular;  Laterality: N/A;  . CARDIOVERSION N/A 11/25/2014   Procedure: CARDIOVERSION;  Surgeon: Evans Lance, MD;  Location: [place];  Service: Cardiovascular;  Laterality: N/A;  . CATARACT EXTRACTION     left eye  . CHOLECYSTECTOMY  1970  .  CORONARY ARTERY BYPASS GRAFT  2003   CABG X4  . INSERT / REPLACE / REMOVE PACEMAKER  2007; 2012   w/AICD  . REFRACTIVE SURGERY     right eye  . TEE WITHOUT CARDIOVERSION N/A 12/16/2015   Procedure: TRANSESOPHAGEAL ECHOCARDIOGRAM (TEE);  Surgeon: Jolaine Artist, MD;  Location[facility]COPY;  Service: Cardiovascular;  Laterality: N/A;  . TUBAL LIGATION  1972    Allergies: Sulfonamide derivatives  Medications: Prior to Admission medications   Medication Sig Start Date End Date Taking? Authorizing Provider  acetaminophen (TYLENOL) 500 MG tablet Take 1,000 mg by mouth 2 (two) times daily as needed for mild pain.     Historical Provider, MD  albuterol (PROVENTIL HFA;VENTOLIN HFA) 108 (90 BASE) MCG/ACT inhaler Inhale 2 puffs into the lungs every 6 (six) hours as needed for wheezing or shortness of breath. 12/26/12   Mosie Lukes, MD  amiodarone (PACERONE) 200 MG tablet Take 200 mg by mouth daily.    Historical Provider, MD  carvedilol (COREG) 25 MG tablet Take 1 tablet (25 mg total) by mouth 2 (two) times daily. 07/12/16   Jolaine Artist, MD  cephALEXin (KEFLEX) 250 MG capsule Take 1 capsule (250 mg total) by mouth 2 (two) times daily. 09/06/16 09/10/16  Dron Tanna Furry, MD  escitalopram (LEXAPRO) 10 MG tablet Take 10 mg by mouth daily.    Historical Provider, MD  fluticasone (FLONASE) 50 MCG/ACT nasal spray Place 2 sprays into both nostrils daily as needed for allergies or rhinitis. As needed for nasal stuffiness. Patient taking differently: Place 2 sprays into both nostrils daily as needed for allergies or rhinitis.  01/13/14   Mosie Lukes, MD  gabapentin (NEURONTIN) 300 MG capsule Take 300 mg by mouth at bedtime.    Historical Provider, MD  glucose blood (FREESTYLE LITE) test strip DX: 250.60  Check sugars bid and as needed Patient taking differently: 1 each by Other route See admin instructions. Check blood sugar twice daily 05/22/13   Mosie Lukes, MD  insulin NPH (HUMULIN  N,NOVOLIN N) 100 UNIT/ML injection Inject 7-40 Units into the skin See admin instructions. 40 units in the morning before breakfast then 7 units in the evening before supper/evening meal    Historical Provider, MD  insulin regular (HUMULIN R) 250 units/2.31mL (100 units/mL) injection Inject 5 Units into the skin daily before breakfast.    Historical Provider, MD  levothyroxine (SYNTHROID, LEVOTHROID) 25 MCG tablet Take 25 mcg by mouth daily before breakfast.    Historical Provider, MD  LORazepam (ATIVAN) 0.5 MG tablet Take 1 tablet (0.5 mg total) by mouth at bedtime. 04/19/16   Mosie Lukes, MD  oxyCODONE-acetaminophen (PERCOCET/ROXICET) 5-325 MG tablet Take 1 tablet by mouth every 8 (eight) hours as needed for severe pain. 09/06/16   Dron Tanna Furry, MD  OXYGEN Inhale 3 L into the lungs continuous.     Historical Provider, MD  torsemide (DEMADEX) 20 MG  tablet Take 20-40 mg by mouth See admin instructions. 40 mg in the morning on Sun/Fri/Sat and 20 mg on Mon/Tues/Wed/Thurs    Historical Provider, MD  warfarin (COUMADIN) 5 MG tablet TAKE AS DIRECTED BY ANTICOAGULATION CLINIC Patient taking differently: Take 5 mg by mouth in the morning on Sun/Tues/Wed/Fr/Sat and 2.5 mg on Mon/Thurs 04/10/16   Evans Lance, MD     Family History  Problem Relation Age of Onset  . Kidney cancer Mother     kidney and female repo - died @ 75  . Asthma Mother   . Cancer Mother     gyn and renal  . Heart disease Mother     CAD  . Heart disease Father     died @ 56  . Stroke Father   . Diabetes Father   . Hypertension Father   . Hyperlipidemia Father   . Heart attack Father   . Hypertension Son   . Kidney cancer Maternal Uncle   . Cancer Maternal Uncle   . Cirrhosis Maternal Grandmother     non alcohol  . Cancer Maternal Grandfather   . Kidney cancer Maternal Grandfather   . Hypertension Maternal Aunt     Social History   Social History  . Marital status: Married    Spouse name: N/A  . Number  of children: Y  . Years of education: N/A   Occupational History  . 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    Social History Main Topics  . Smoking status: Never Smoker  . Smokeless tobacco: Never Used  . Alcohol use No  . Drug use: No  . Sexual activity: Not Currently     Comment: lives with husband, no major dietary restrictions   Other Topics Concern  . None   Social History Narrative   Lives in [place] with her husband.  She does not routinely exercise or adhere to any particular diet.      Review of Systems: A 12 point ROS discussed and pertinent positives are indicated in the HPI above.  All other systems are negative.  Review of Systems  Constitutional: Positive for activity change. Negative for appetite change, fatigue and fever.  Respiratory: Negative for cough and shortness of breath.   Cardiovascular: Negative for chest pain.  Gastrointestinal: Negative for abdominal pain.  Musculoskeletal: Positive for back pain and gait problem.  Neurological: Positive for weakness.  Psychiatric/Behavioral: Negative for behavioral problems and confusion.    Vital Signs: BP (!) 128/58 (BP Location: Right Arm)   Pulse 71   Temp 97.7 F (36.5 C) (Oral)   Ht 5\' 4"  (1.626 m)   Wt 228 lb (103.4 kg)   SpO2 98%   BMI 39.14 kg/m   Physical Exam  Constitutional: She is oriented to person, place, and time.  Cardiovascular: Normal rate and regular rhythm.   Murmur heard. Pulmonary/Chest: Effort normal and breath sounds normal. She has no wheezes.  Abdominal: Soft. Bowel sounds are normal. There is no tenderness.  Musculoskeletal: Normal range of motion.  Mid to low back pain  Neurological: She is alert and oriented to person, place, and time.  Skin: Skin is warm and dry.  Psychiatric: She has a normal mood and affect. Her behavior is normal. Judgment and thought content normal.  Nursing note and vitals reviewed.   Mallampati Score:  MD Evaluation Airway:  WNL Heart: WNL Abdomen: WNL Chest/ Lungs: WNL ASA  Classification: 3 Mallampati/Airway Score: One  Imaging: Dg  Sacrum/coccyx  Result Date: 09/04/2016 CLINICAL DATA:  Pain following fall EXAM: SACRUM AND COCCYX - 2+ VIEW COMPARISON:  CT pelvis August 09, 2011 FINDINGS: Frontal, tilt frontal, and lateral views were obtained. There is a small avulsion along the anterior mid coccyx. No other fracture. No diastases. There is osteoarthritic change in both sacroiliac joints. No erosive change. IMPRESSION: Small avulsion arising from the anterior mid coccyx. No other fracture. No diastases. Degenerative type change noted in each sacroiliac joint. Electronically Signed   By: Lowella Grip III M.D.   On: 09/04/2016 09:52   Ct Lumbar Spine Wo Contrast  Result Date: 09/04/2016 CLINICAL DATA:  Progressive low back pain after fall 3-4 weeks ago. Bilateral hip pain. Increased difficulty with ambulation. EXAM: CT LUMBAR SPINE WITHOUT CONTRAST TECHNIQUE: Multidetector CT imaging of the lumbar spine was performed without intravenous contrast administration. Multiplanar CT image reconstructions were also generated. COMPARISON:  CT of the lumbar spine 08/07/2016. FINDINGS: Segmentation: 5 non rib-bearing lumbar type vertebral bodies are present. Alignment: Grade 1 anterolisthesis at L4-5 is stable. AP alignment is otherwise anatomic. Vertebrae: A progressive superior endplate compression fracture is noted L2. The fracture extends into the more inferior aspects 7 vertebral body than on the prior exam. No significant collapse is evident on supine imaging. There is no significant retropulsion of bone. Paraspinal and other soft tissues: Calcifications are again seen within an enlarged spleen. Marked renal atrophy is noted bilaterally. Atherosclerotic changes are present in the aorta and branch vessels. No significant adenopathy is present. Disc levels: T12-L1: Mild facet hypertrophy and leftward disc bulging is present  without significant stenosis. L1-2: A leftward disc bulging and bilateral facet hypertrophy is again noted. Disc bulging is slightly worse than on the prior study. Mild left subarticular and foraminal narrowing is evident. L2-3:  Mild facet hypertrophy is noted bilaterally. L3-4: Moderate facet hypertrophy is present bilaterally. A leftward disc bulge is noted. There is no significant interval change. L4-5: Advanced facet hypertrophy is present. A broad-based disc protrusion is again seen. Severe central and moderate bilateral foraminal stenosis is evident bilaterally. L5-S1: Moderate facet spurring is present bilaterally. There is no significant stenosis. IMPRESSION: 1. Progressive superior endplate compression fracture at L2. Although there is no significant retropulsion of bone, disc disease is worse on the left, leading to left subarticular and foraminal narrowing at L1-2. 2. No significant progression in loss of height at L2-1 supine images. Upright images may demonstrate ossified. 3. Severe central and moderate bilateral foraminal stenosis at L4-5. 4. Multilevel facet degenerative change. 5.  Aortic Atherosclerosis (ICD10-I70.0). Electronically Signed   By: San Morelle M.D.   On: 09/04/2016 11:07   Ct Pelvis Wo Contrast  Result Date: 09/04/2016 CLINICAL DATA:  Pelvic pain, low back pain, BILATERAL hip pain, fell 3-4 weeks ago, worsening low back pain after mechanical fall EXAM: CT PELVIS WITHOUT CONTRAST TECHNIQUE: Multidetector CT imaging of the pelvis was performed following the standard protocol without intravenous contrast. Sagittal and coronal MPR images reconstructed from axial data set. COMPARISON:  RIGHT hip CT 08/07/2016 FINDINGS: Urinary Tract: Visualize distal ureters and urinary bladder unremarkable Bowel: Sigmoid diverticulosis without evidence of diverticulitis. Normal appendix. Remaining visualized bowel loops unremarkable Vascular/Lymphatic: Atherosclerotic calcifications  throughout the iliac and femoral arteries bilaterally. Scattered pelvic phleboliths Reproductive: Uterus surgically absent. Nonvisualization of ovaries. Other: Minimal amount of nonspecific free fluid. No free air or inflammatory process Musculoskeletal: Diffuse osseous demineralization. Minimal degenerative changes at pubic symphysis and RIGHT hip joint. Significant degenerative facet disease changes at  visualized lower lumbar spine. Multifactorial spinal stenosis at L4-L5, present since 2011. No fracture or bone destruction seen. IMPRESSION: Osseous demineralization with mild degenerative changes of the RIGHT hip joint with more advanced facet degenerative changes at the visualized lower lumbar spine. Multifactorial spinal stenosis at L4-L5. No acute bony abnormalities. Sigmoid diverticulosis. Minimal nonspecific free pelvic fluid. Electronically Signed   By: Lavonia Dana M.D.   On: 09/04/2016 10:53   Nm Bone W/spect  Result Date: 09/05/2016 CLINICAL DATA:  76 year old female with a history of lumbar back pain. Prior CT scan demonstrates L2 compression fracture which is indeterminate age. Planning study for potential vertebral augmentation, as the patient cannot undergo MRI. EXAM: NM BONE SCAN AND SPECT IMAGING TECHNIQUE: After intravenous injection of radiopharmaceutical, delayed planar images were obtained in multiple projections. Additionally, delayed triplanar SPECT images were obtained through the area of interest. RADIOPHARMACEUTICALS:  25 mCi Tc-4m MDP COMPARISON:  CT 09/04/2016 FINDINGS: Nuclear medicine bone scan with planar imaging anterior and posterior as well as single photon emission computed tomography demonstrate intense uptake at the L2 level corresponding to the fracture cleft on prior CT. Activity at the L4-L5 facets, compatible with degenerative changes IMPRESSION: Limited bone scan with SPECT demonstrates intense uptake at the L2 level compatible with acute/subacute compression fracture.  Electronically Signed   By: Corrie Mckusick D.O.   On: 09/05/2016 16:57    Labs:  CBC:  Recent Labs  04/27/16 1031 08/27/16 1055 09/04/16 1044 09/05/16 0857  WBC 4.4 3.0* 2.5* 2.8*  HGB 10.6* 11.0* 10.6* 11.2*  HCT 31.7* 32.6* 33.1* 34.0*  PLT 135.0* 70.0* 71* 73*    COAGS:  Recent Labs  08/27/16 1224 09/04/16 1608 09/05/16 0857 09/06/16 0811  INR 3.4 4.76* 4.46* 3.54  APTT  --  102*  --   --     BMP:  Recent Labs  01/08/16 0349 01/09/16 0240  04/27/16 1031  08/20/16 1431 08/27/16 1055 09/04/16 1044 09/05/16 0857  NA 139 138  < > 137  < > 139 134* 136 138  K 4.3 4.2  < > 4.8  < > 4.4 4.7 3.7 4.2  CL 95* 94*  < > 96  --   --  97 101 102  CO2 37* 35*  < > 34*  < > 27 31 29 27   GLUCOSE 111* 91  < > 225*  < > 166* 254* 130* 163*  BUN 62* 61*  < > 44*  < > 44.6* 42* 36* 35*  CALCIUM 8.8* 8.9  < > 9.8  < > 10.0 10.1 9.7 9.5  CREATININE 2.42* 2.33*  < > 3.73*  < > 3.1* 3.02* 2.75* 3.02*  GFRNONAA 19* 20*  --   --   --   --   --  16* 14*  GFRAA 22* 23*  --   --   --   --   --  19* 17*  < > = values in this interval not displayed.  LIVER FUNCTION TESTS:  Recent Labs  08/20/16 1431 08/27/16 1055 09/04/16 1044 09/05/16 0857  BILITOT 0.82 0.9 0.8 1.3*  AST 33 28 35 34  ALT 25 15 20 19   ALKPHOS 129 128* 117 110  PROT 5.7* 6.0 5.5* 5.6*  ALBUMIN 2.8* 3.1* 2.8* 2.7*    TUMOR MARKERS: No results for input(s): AFPTM, CEA, CA199, CHROMGRNA in the last 8760 hours.  Assessment and Plan:  Acute L2 fracture  Severe back pain Scheduled for Vertebroplasty/kyphoplasty Checking INR and UA now If  all clear and INR wnl---will move forward with procedure today. Risks and Benefits discussed with the patient including, but not limited to education regarding the natural healing process of compression fractures without intervention, bleeding, infection, cement migration which may cause spinal cord damage, paralysis, pulmonary embolism or even death. All of the patient's  questions were answered, patient is agreeable to proceed. Consent signed and in chart.  Thank you for this interesting consult.  I greatly enjoyed meeting SANVI EHLER and look forward to participating in their care.  A copy of this report was sent to the requesting provider on this date.  Electronically Signed: Monia Sabal A 09/10/2016, 10:46 AM   I spent a total of  30 Minutes   in face to face in clinical consultation, greater than 50% of which was counseling/coordinating care for L2 VP/KP

## 2016-09-11 ENCOUNTER — Encounter (HOSPITAL_COMMUNITY): Payer: Self-pay

## 2016-09-11 ENCOUNTER — Encounter (HOSPITAL_COMMUNITY): Payer: Medicare Other

## 2016-09-11 ENCOUNTER — Other Ambulatory Visit: Payer: Self-pay | Admitting: General Surgery

## 2016-09-11 ENCOUNTER — Encounter: Payer: Medicare Other | Admitting: Vascular Surgery

## 2016-09-11 ENCOUNTER — Ambulatory Visit (HOSPITAL_COMMUNITY)
Admission: RE | Admit: 2016-09-11 | Discharge: 2016-09-11 | Disposition: A | Discharge status: Home / Self Care | Payer: Medicare Other | Source: Ambulatory Visit | Attending: Interventional Radiology | Admitting: Interventional Radiology

## 2016-09-11 ENCOUNTER — Other Ambulatory Visit (HOSPITAL_COMMUNITY): Payer: Medicare Other

## 2016-09-11 DIAGNOSIS — E1140 Type 2 diabetes mellitus with diabetic neuropathy, unspecified: Secondary | ICD-10-CM | POA: Insufficient documentation

## 2016-09-11 DIAGNOSIS — I6523 Occlusion and stenosis of bilateral carotid arteries: Secondary | ICD-10-CM | POA: Insufficient documentation

## 2016-09-11 DIAGNOSIS — M4856XA Collapsed vertebra, not elsewhere classified, lumbar region, initial encounter for fracture: Secondary | ICD-10-CM | POA: Diagnosis not present

## 2016-09-11 DIAGNOSIS — Z95810 Presence of automatic (implantable) cardiac defibrillator: Secondary | ICD-10-CM | POA: Insufficient documentation

## 2016-09-11 DIAGNOSIS — M109 Gout, unspecified: Secondary | ICD-10-CM | POA: Diagnosis not present

## 2016-09-11 DIAGNOSIS — G8929 Other chronic pain: Secondary | ICD-10-CM | POA: Diagnosis not present

## 2016-09-11 DIAGNOSIS — W1839XA Other fall on same level, initial encounter: Secondary | ICD-10-CM | POA: Insufficient documentation

## 2016-09-11 DIAGNOSIS — G4733 Obstructive sleep apnea (adult) (pediatric): Secondary | ICD-10-CM | POA: Insufficient documentation

## 2016-09-11 DIAGNOSIS — I480 Paroxysmal atrial fibrillation: Secondary | ICD-10-CM | POA: Insufficient documentation

## 2016-09-11 DIAGNOSIS — N184 Chronic kidney disease, stage 4 (severe): Secondary | ICD-10-CM | POA: Diagnosis not present

## 2016-09-11 DIAGNOSIS — E039 Hypothyroidism, unspecified: Secondary | ICD-10-CM | POA: Insufficient documentation

## 2016-09-11 DIAGNOSIS — Z951 Presence of aortocoronary bypass graft: Secondary | ICD-10-CM | POA: Insufficient documentation

## 2016-09-11 DIAGNOSIS — I5042 Chronic combined systolic (congestive) and diastolic (congestive) heart failure: Secondary | ICD-10-CM | POA: Diagnosis not present

## 2016-09-11 DIAGNOSIS — I2510 Atherosclerotic heart disease of native coronary artery without angina pectoris: Secondary | ICD-10-CM | POA: Insufficient documentation

## 2016-09-11 DIAGNOSIS — Z8572 Personal history of non-Hodgkin lymphomas: Secondary | ICD-10-CM | POA: Insufficient documentation

## 2016-09-11 DIAGNOSIS — I447 Left bundle-branch block, unspecified: Secondary | ICD-10-CM | POA: Insufficient documentation

## 2016-09-11 DIAGNOSIS — Z794 Long term (current) use of insulin: Secondary | ICD-10-CM | POA: Diagnosis not present

## 2016-09-11 DIAGNOSIS — Z7901 Long term (current) use of anticoagulants: Secondary | ICD-10-CM | POA: Insufficient documentation

## 2016-09-11 DIAGNOSIS — M545 Low back pain: Secondary | ICD-10-CM | POA: Diagnosis not present

## 2016-09-11 DIAGNOSIS — Z6841 Body Mass Index (BMI) 40.0 and over, adult: Secondary | ICD-10-CM | POA: Diagnosis not present

## 2016-09-11 DIAGNOSIS — I130 Hypertensive heart and chronic kidney disease with heart failure and stage 1 through stage 4 chronic kidney disease, or unspecified chronic kidney disease: Secondary | ICD-10-CM | POA: Insufficient documentation

## 2016-09-11 DIAGNOSIS — Z8249 Family history of ischemic heart disease and other diseases of the circulatory system: Secondary | ICD-10-CM | POA: Insufficient documentation

## 2016-09-11 DIAGNOSIS — S32029A Unspecified fracture of second lumbar vertebra, initial encounter for closed fracture: Secondary | ICD-10-CM | POA: Insufficient documentation

## 2016-09-11 DIAGNOSIS — E11319 Type 2 diabetes mellitus with unspecified diabetic retinopathy without macular edema: Secondary | ICD-10-CM | POA: Insufficient documentation

## 2016-09-11 DIAGNOSIS — E785 Hyperlipidemia, unspecified: Secondary | ICD-10-CM | POA: Diagnosis not present

## 2016-09-11 DIAGNOSIS — J449 Chronic obstructive pulmonary disease, unspecified: Secondary | ICD-10-CM | POA: Diagnosis not present

## 2016-09-11 DIAGNOSIS — Y92009 Unspecified place in unspecified non-institutional (private) residence as the place of occurrence of the external cause: Secondary | ICD-10-CM | POA: Insufficient documentation

## 2016-09-11 DIAGNOSIS — D696 Thrombocytopenia, unspecified: Secondary | ICD-10-CM | POA: Diagnosis not present

## 2016-09-11 DIAGNOSIS — E559 Vitamin D deficiency, unspecified: Secondary | ICD-10-CM | POA: Diagnosis not present

## 2016-09-11 DIAGNOSIS — M898X9 Other specified disorders of bone, unspecified site: Secondary | ICD-10-CM | POA: Diagnosis not present

## 2016-09-11 DIAGNOSIS — E1122 Type 2 diabetes mellitus with diabetic chronic kidney disease: Secondary | ICD-10-CM | POA: Diagnosis not present

## 2016-09-11 DIAGNOSIS — Z882 Allergy status to sulfonamides status: Secondary | ICD-10-CM | POA: Insufficient documentation

## 2016-09-11 DIAGNOSIS — Z9981 Dependence on supplemental oxygen: Secondary | ICD-10-CM | POA: Insufficient documentation

## 2016-09-11 HISTORY — PX: IR KYPHO LUMBAR INC FX REDUCE BONE BX UNI/BIL CANNULATION INC/IMAGING: IMG5519

## 2016-09-11 LAB — CBC WITH DIFFERENTIAL/PLATELET
Basophils Absolute: 0 10*3/uL (ref 0.0–0.1)
Basophils Relative: 0 %
EOS PCT: 3 %
Eosinophils Absolute: 0.1 10*3/uL (ref 0.0–0.7)
HCT: 32.1 % — ABNORMAL LOW (ref 36.0–46.0)
Hemoglobin: 10.4 g/dL — ABNORMAL LOW (ref 12.0–15.0)
LYMPHS ABS: 0.7 10*3/uL (ref 0.7–4.0)
LYMPHS PCT: 21 %
MCH: 30.7 pg (ref 26.0–34.0)
MCHC: 32.4 g/dL (ref 30.0–36.0)
MCV: 94.7 fL (ref 78.0–100.0)
MONO ABS: 0.5 10*3/uL (ref 0.1–1.0)
MONOS PCT: 13 %
Neutro Abs: 2.2 10*3/uL (ref 1.7–7.7)
Neutrophils Relative %: 62 %
PLATELETS: 69 10*3/uL — AB (ref 150–400)
RBC: 3.39 MIL/uL — ABNORMAL LOW (ref 3.87–5.11)
RDW: 17.2 % — AB (ref 11.5–15.5)
WBC: 3.5 10*3/uL — ABNORMAL LOW (ref 4.0–10.5)

## 2016-09-11 LAB — GLUCOSE, CAPILLARY
GLUCOSE-CAPILLARY: 175 mg/dL — AB (ref 65–99)
GLUCOSE-CAPILLARY: 170 mg/dL — AB (ref 65–99)

## 2016-09-11 MED ORDER — TOBRAMYCIN SULFATE 1.2 G IJ SOLR
INTRAMUSCULAR | Status: AC | PRN
  Administered 2016-09-11: 1.2 g via TOPICAL

## 2016-09-11 MED ORDER — CEFAZOLIN SODIUM-DEXTROSE 2-4 GM/100ML-% IV SOLN
2.0000 g | INTRAVENOUS | Status: AC
  Administered 2016-09-11: 2 g via INTRAVENOUS

## 2016-09-11 MED ORDER — MIDAZOLAM HCL 2 MG/2ML IJ SOLN
INTRAMUSCULAR | Status: AC | PRN
Start: 2016-09-11 — End: 2016-09-11
  Administered 2016-09-11: 1 mg via INTRAVENOUS

## 2016-09-11 MED ORDER — MIDAZOLAM HCL 2 MG/2ML IJ SOLN
INTRAMUSCULAR | Status: AC
  Filled 2016-09-11: qty 2

## 2016-09-11 MED ORDER — SODIUM CHLORIDE 0.9 % IV SOLN: INTRAVENOUS | Status: AC

## 2016-09-11 MED ORDER — FENTANYL CITRATE (PF) 100 MCG/2ML IJ SOLN
INTRAMUSCULAR | Status: AC | PRN
  Administered 2016-09-11: 25 ug via INTRAVENOUS

## 2016-09-11 MED ORDER — BUPIVACAINE HCL (PF) 0.25 % IJ SOLN
INTRAMUSCULAR | Status: AC
  Filled 2016-09-11: qty 30

## 2016-09-11 MED ORDER — CEFAZOLIN SODIUM-DEXTROSE 2-4 GM/100ML-% IV SOLN
INTRAVENOUS | Status: AC
  Administered 2016-09-11: 2 g via INTRAVENOUS
  Filled 2016-09-11: qty 100

## 2016-09-11 MED ORDER — IOPAMIDOL (ISOVUE-300) INJECTION 61%
INTRAVENOUS | Status: AC
  Administered 2016-09-11: 10 mL
  Filled 2016-09-11: qty 50

## 2016-09-11 MED ORDER — BUPIVACAINE HCL (PF) 0.25 % IJ SOLN
INTRAMUSCULAR | Status: AC | PRN
  Administered 2016-09-11: 20 mL

## 2016-09-11 MED ORDER — HYDROMORPHONE HCL 1 MG/ML IJ SOLN
INTRAMUSCULAR | Status: AC
  Filled 2016-09-11: qty 1

## 2016-09-11 MED ORDER — FENTANYL CITRATE (PF) 100 MCG/2ML IJ SOLN
INTRAMUSCULAR | Status: AC
  Filled 2016-09-11: qty 2

## 2016-09-11 MED ORDER — HYDROMORPHONE HCL 1 MG/ML IJ SOLN
INTRAMUSCULAR | Status: AC | PRN
  Administered 2016-09-11: 1 mg via INTRAVENOUS

## 2016-09-11 MED ORDER — TOBRAMYCIN SULFATE 1.2 G IJ SOLR
INTRAMUSCULAR | Status: AC
  Filled 2016-09-11: qty 1.2

## 2016-09-11 MED ORDER — SODIUM CHLORIDE 0.9 % IV SOLN: INTRAVENOUS | Status: DC

## 2016-09-11 NOTE — Discharge Instructions (Signed)
1. No stooping,bending or lifting more than 10 lbs for 2 weeks. 2.Use walker to ambulate for 2 weeks. 3.No driving nfor 2 weeks. 4.RTC PRN  In 2 weeks.  KYPHOPLASTY/VERTEBROPLASTY DISCHARGE INSTRUCTIONS  Medications: (check all that apply)     Resume all home medications as before procedure.       Resume your (aspirin/Plavix/Coumadin) on per MD        Continue your pain medications as prescribed as needed.  Over the next 3-5 days, decrease your pain medication as tolerated.  Over the counter medications (i.e. Tylenol, ibuprofen, and aleve) may be substituted once severe/moderate pain symptoms have subsided.   Wound Care: - Bandages may be removed the day following your procedure.  You may get your incision wet once bandages are removed.  Bandaids may be used to cover the incisions until scab formation.  Topical ointments are optional.  - If you develop a fever greater than 101 degrees, have increased skin redness at the incision sites or pus-like oozing from incisions occurring within 1 week of the procedure, contact radiology at [phone] or [phone].  - Ice pack to back for 15-20 minutes 2-3 time per day for first 2-3 days post procedure.  The ice will expedite muscle healing and help with the pain from the incisions.   Activity: - Bedrest today with limited activity for 24 hours post procedure.  - No driving for 48 hours.  - Increase your activity as tolerated after bedrest (with assistance if necessary).  - Refrain from any strenuous activity or heavy lifting (greater than 10 lbs.).   Follow up: - Contact radiology at [phone] or [phone] if any questions/concerns.  - A physician assistant from radiology will contact you in approximately 1 week.  - If a biopsy was performed at the time of your procedure, your referring physician should receive the results in usually 2-3 days.

## 2016-09-11 NOTE — H&P (Signed)
Chief Complaint: L2 compression fracture  Referring Physician: Dr. Katheran James  Supervising Physician: Luanne Bras  Patient Status: [name] - Out-pt  HPI: Destiny [name] is a 76 y.o. female   Pt fell at home 08/04/16---fell while trying to pick up something from floor Was seen in ED 3/27 Xrays revealed L2 fracture and was released with pain meds and referral to Dr Estanislado Pandy Before pt could be seen she returned to ED 09/04/16 in severe pain and was admitted for pain control. Determined to have UTI and INR was 4.76 secondary coumadin meds. Referral/order for inpt Vertebroplasty/kyphoplasty--- INR slowly was trending down; could not safely perform procedure INR 3.5 4/26 at DC from hospital. Was determined best to DC and return when INR wnl and UTI had been treated Last dose antibiotics was today. Last coumadin was 4/25 Labs pending  NM study 4/25: IMPRESSION: Limited bone scan with SPECT demonstrates intense uptake at the L2 level compatible with acute/subacute compression fracture  Scheduled now for L2 VP/KP if UTI cleared and INR wnl  Past Medical History:  Past Medical History:  Diagnosis Date  . AICD (automatic cardioverter/defibrillator) present   . Anemia, unspecified 12/13/2013  . Back pain, chronic    "just when I walk; mass on 3rd and 4th vertebrae right lower back"  . Biventricular implantable cardiac defibrillator in situ 2007, 2012   a. 2007;  b. 2012 Gen change: SJM 3231-40 Uni BiV ICD, ser # [id number].  Marland Kitchen CAD (coronary artery disease)    a. 05/2001 CABG x4: LIMA->LAD, VG->D1, VG->D2, VG->RCA.  . Cardiomyopathy, ischemic    a. 2012 s/p SJM 3231-40 Uni BiV ICD, ser # [id number].  . Carotid stenosis    a. 10/19/2011 carotid duplex - Mild hard plaque bilaterally. Stable 40-59% bilateral ICA stenosis. Carotid US (04/2013):  Bilateral 40-59% ICA.  F/u 1 year  . Cerumen impaction 10/28/2015  . Chronic combined systolic and diastolic CHF (congestive heart failure) (HCC)      a. EF as low as 25% in 2006;  b. EF 60-65% in 12/2011;  c. 02/2013 Echo: EF 35-40, mod mid-dist antsept HK, Gr 2 DD, mild LVH.  . CKD (chronic kidney disease), stage IV ([place])    hx/notes 09/04/2016  . COPD (chronic obstructive pulmonary disease) ([place])   . COPD, severe ([place]) 01/22/2016  . 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 Dyslipidemia   . Gout ~ 08/2011  . Hypercalcemia 09/26/2013  . Hypertension   . Hypothyroidism   . Hypoxemia requiring supplemental oxygen   . LBBB (left bundle branch block)   . Lumbar compression fracture ([place])    L2/notes 09/04/2016  . Morbid obesity with BMI of 45.0-49.9, adult (HCC)    Ht. 5'4". BMI 47.2  . Mural thrombus of left ventricle    before 2003, while on Coumadin, No h/o CVA  . 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 Benay Spice, in remission  . On home oxygen therapy    "normally 2-3L; 24/7" (12/14/2015); "2L; 24/7" (09/04/2016)  . OSA on CPAP   . PAF (paroxysmal atrial fibrillation) (HCC)    on Coumadin  . Presence of permanent cardiac pacemaker   . Retinopathy due to secondary diabetes ([place])    type II, uncontrolled  . Urinary incontinence 06/25/2016  . Vertebral fracture, osteoporotic ([place]) 08/27/2016  . Vitamin D deficiency 06/09/2012   historical    Past Surgical History:  Past Surgical  History:  Procedure Laterality Date  . ABDOMINAL HYSTERECTOMY  1982  . CARDIAC CATHETERIZATION  06/03/01  . CARDIOVERSION N/A 07/30/2014   Procedure: CARDIOVERSION;  Surgeon: Jolaine Artist, MD;  Location: [facility] ENDOSCOPY;  Service: Cardiovascular;  Laterality: N/A;  . CARDIOVERSION N/A 11/25/2014   Procedure: CARDIOVERSION;  Surgeon: Evans Lance, MD;  Location: [facility];  Service: Cardiovascular;  Laterality: N/A;  . CATARACT EXTRACTION     left eye  . CHOLECYSTECTOMY  1970  . CORONARY ARTERY BYPASS GRAFT  2003   CABG X4  . INSERT / REPLACE /  REMOVE PACEMAKER  2007; 2012   w/AICD  . REFRACTIVE SURGERY     right eye  . TEE WITHOUT CARDIOVERSION N/A 12/16/2015   Procedure: TRANSESOPHAGEAL ECHOCARDIOGRAM (TEE);  Surgeon: Jolaine Artist, MD;  Location: [facility] ENDOSCOPY;  Service: Cardiovascular;  Laterality: N/A;  . TUBAL LIGATION  1972    Family History:  Family History  Problem Relation Age of Onset  . Kidney cancer Mother     kidney and female repo - died @ 24  . Asthma Mother   . Cancer Mother     gyn and renal  . Heart disease Mother     CAD  . Heart disease Father     died @ 39  . Stroke Father   . Diabetes Father   . Hypertension Father   . Hyperlipidemia Father   . Heart attack Father   . Hypertension Son   . Kidney cancer Maternal Uncle   . Cancer Maternal Uncle   . Cirrhosis Maternal Grandmother     non alcohol  . Cancer Maternal Grandfather   . Kidney cancer Maternal Grandfather   . Hypertension Maternal Aunt     Social History:  reports that she has never smoked. She has never used smokeless tobacco. She reports that she does not drink alcohol or use drugs.  Allergies:  Allergies  Allergen Reactions  . Sulfonamide Derivatives Other (See Comments)    Reaction unknown; "childhood allergy/mother"    Medications: Medications reviewed in epic  Please HPI for pertinent positives, otherwise complete 10 system ROS negative.  Mallampati Score: MD Evaluation Airway: WNL Heart: WNL Abdomen: WNL Chest/ Lungs: WNL ASA  Classification: 3 Mallampati/Airway Score: One  Physical Exam: BP (!) 142/63 (BP Location: Left Arm)   Pulse 70   Resp 15   SpO2 98%  There is no height or weight on file to calculate BMI. General: pleasant, obese white female who is laying in bed in NAD HEENT: head is normocephalic, atraumatic.  Sclera are noninjected.  PERRL.  Ears and nose without any masses or lesions.  Mouth is pink and moist Heart: regular, rate, and rhythm.  Normal s1,s2. +murmur No obvious gallops, or rubs  noted.  Palpable radial pulses bilaterally Lungs: CTAB, no wheezes, rhonchi, or rales noted.  Respiratory effort nonlabored Abd: soft, NT, ND, +BS, no masses, hernias, or organomegaly Psych: A&Ox3 with an appropriate affect.   Labs: Results for orders placed or performed during the hospital encounter of 09/11/16 (from the past 48 hour(s))  CBC with Differential/Platelet     Status: Abnormal   Collection Time: 09/11/16 11:20 AM  Result Value Ref Range   WBC 3.5 (L) 4.0 - 10.5 K/uL   RBC 3.39 (L) 3.87 - 5.11 MIL/uL   Hemoglobin 10.4 (L) 12.0 - 15.0 g/dL   HCT 32.1 (L) 36.0 - 46.0 %   MCV 94.7 78.0 - 100.0 fL   MCH 30.7 26.0 -  34.0 pg   MCHC 32.4 30.0 - 36.0 g/dL   RDW 17.2 (H) 11.5 - 15.5 %   Platelets 69 (L) 150 - 400 K/uL    Comment: SPECIMEN CHECKED FOR CLOTS CONSISTENT WITH PREVIOUS RESULT    Neutrophils Relative % 62 %   Neutro Abs 2.2 1.7 - 7.7 K/uL   Lymphocytes Relative 21 %   Lymphs Abs 0.7 0.7 - 4.0 K/uL   Monocytes Relative 13 %   Monocytes Absolute 0.5 0.1 - 1.0 K/uL   Eosinophils Relative 3 %   Eosinophils Absolute 0.1 0.0 - 0.7 K/uL   Basophils Relative 0 %   Basophils Absolute 0.0 0.0 - 0.1 K/uL  Glucose, capillary     Status: Abnormal   Collection Time: 09/11/16 12:15 PM  Result Value Ref Range   Glucose-Capillary 175 (H) 65 - 99 mg/dL   *Note: Due to a large number of results and/or encounters for the requested time period, some results have not been displayed. A complete set of results can be found in Results Review.    Imaging: No results found.  Assessment/Plan 1. L2 compression fracture -we will plan to proceed today with a KP vs VP as she was cancelled yesterday due to a scheduling issues with an emergency case. -due to her history of NHL and recently thrombocytopenia, etc, I have called Dr. Benay Spice who has ok'd [place] to do a biopsy of this fracture area prior to fixing it to make sure she doesn't have any evidence of recurrence.   -labs and vitals  have been reviewed. -Risks and Benefits discussed with the patient including, but not limited to education regarding the natural healing process of compression fractures without intervention, bleeding, infection, cement migration which may cause spinal cord damage, paralysis, pulmonary embolism or even death. All of the patient's questions were answered, patient is agreeable to proceed. Consent signed and in chart.  Thank you for this interesting consult.  I greatly enjoyed meeting TAMALYN WADSWORTH and look forward to participating in their care.  A copy of this report was sent to the requesting provider on this date.  Electronically Signed: Henreitta Cea 09/11/2016, 12:19 PM   I spent a total of    25 Minutes in face to face in clinical consultation, greater than 50% of which was counseling/coordinating care for L2 compression fracture

## 2016-09-11 NOTE — Procedures (Signed)
S/P L2 balloon KP with biopsy

## 2016-09-11 NOTE — Progress Notes (Signed)
Assumed care of pt from Duayne Cal, RN. Assessment documented.

## 2016-09-12 ENCOUNTER — Encounter (HOSPITAL_COMMUNITY): Payer: Self-pay | Admitting: Interventional Radiology

## 2016-09-12 ENCOUNTER — Ambulatory Visit (INDEPENDENT_AMBULATORY_CARE_PROVIDER_SITE_OTHER): Payer: Medicare Other | Admitting: Interventional Cardiology

## 2016-09-12 DIAGNOSIS — Z8673 Personal history of transient ischemic attack (TIA), and cerebral infarction without residual deficits: Secondary | ICD-10-CM

## 2016-09-12 DIAGNOSIS — S32020D Wedge compression fracture of second lumbar vertebra, subsequent encounter for fracture with routine healing: Secondary | ICD-10-CM | POA: Diagnosis not present

## 2016-09-12 DIAGNOSIS — I130 Hypertensive heart and chronic kidney disease with heart failure and stage 1 through stage 4 chronic kidney disease, or unspecified chronic kidney disease: Secondary | ICD-10-CM | POA: Diagnosis not present

## 2016-09-12 DIAGNOSIS — I5042 Chronic combined systolic (congestive) and diastolic (congestive) heart failure: Secondary | ICD-10-CM | POA: Diagnosis not present

## 2016-09-12 DIAGNOSIS — E1122 Type 2 diabetes mellitus with diabetic chronic kidney disease: Secondary | ICD-10-CM | POA: Diagnosis not present

## 2016-09-12 DIAGNOSIS — I480 Paroxysmal atrial fibrillation: Secondary | ICD-10-CM

## 2016-09-12 DIAGNOSIS — I2510 Atherosclerotic heart disease of native coronary artery without angina pectoris: Secondary | ICD-10-CM | POA: Diagnosis not present

## 2016-09-12 DIAGNOSIS — S22000D Wedge compression fracture of unspecified thoracic vertebra, subsequent encounter for fracture with routine healing: Secondary | ICD-10-CM | POA: Diagnosis not present

## 2016-09-12 DIAGNOSIS — Z5181 Encounter for therapeutic drug level monitoring: Secondary | ICD-10-CM

## 2016-09-14 ENCOUNTER — Telehealth: Payer: Self-pay | Admitting: Family Medicine

## 2016-09-14 ENCOUNTER — Other Ambulatory Visit (HOSPITAL_COMMUNITY): Payer: Medicare Other

## 2016-09-14 DIAGNOSIS — I130 Hypertensive heart and chronic kidney disease with heart failure and stage 1 through stage 4 chronic kidney disease, or unspecified chronic kidney disease: Secondary | ICD-10-CM | POA: Diagnosis not present

## 2016-09-14 DIAGNOSIS — S22000D Wedge compression fracture of unspecified thoracic vertebra, subsequent encounter for fracture with routine healing: Secondary | ICD-10-CM | POA: Diagnosis not present

## 2016-09-14 DIAGNOSIS — I5042 Chronic combined systolic (congestive) and diastolic (congestive) heart failure: Secondary | ICD-10-CM | POA: Diagnosis not present

## 2016-09-14 DIAGNOSIS — I2510 Atherosclerotic heart disease of native coronary artery without angina pectoris: Secondary | ICD-10-CM | POA: Diagnosis not present

## 2016-09-14 DIAGNOSIS — S32020D Wedge compression fracture of second lumbar vertebra, subsequent encounter for fracture with routine healing: Secondary | ICD-10-CM | POA: Diagnosis not present

## 2016-09-14 DIAGNOSIS — E1122 Type 2 diabetes mellitus with diabetic chronic kidney disease: Secondary | ICD-10-CM | POA: Diagnosis not present

## 2016-09-14 NOTE — Telephone Encounter (Signed)
[place] [phone]   Called in to request orders to work with pt.   Frequency    1 x week for 1 week   2 x a week for 3 weeks

## 2016-09-14 NOTE — Telephone Encounter (Signed)
HHRN informed of verbal ok per PCP 

## 2016-09-17 ENCOUNTER — Telehealth: Payer: Self-pay | Admitting: *Deleted

## 2016-09-17 ENCOUNTER — Ambulatory Visit (INDEPENDENT_AMBULATORY_CARE_PROVIDER_SITE_OTHER): Payer: Self-pay

## 2016-09-17 DIAGNOSIS — I5022 Chronic systolic (congestive) heart failure: Secondary | ICD-10-CM

## 2016-09-17 DIAGNOSIS — Z95810 Presence of automatic (implantable) cardiac defibrillator: Secondary | ICD-10-CM

## 2016-09-17 NOTE — Progress Notes (Signed)
EPIC Encounter for ICM Monitoring  Patient Name: DARSHA ZUMSTEIN is a 76 y.o. female Date: 09/17/2016 Primary Care Physican: Mosie Lukes, MD Primary Cardiologist: [place] Electrophysiologist: Lovena Le Nephrologist: [place] Weight: Last known weight228lb  Bi-V Pacing: >99% Battery Longevity: 7.2 months          Heart Failure questions reviewed, pt denied fluid symptoms.  Hospitalized for back pain and discharged 4/26   Thoracic impedance close to baseline today but has been abnormal suggesting fluid accumulation from 09/04/2016 to 09/13/2016 and 09/15/2016 to 09/16/2016.  Prescribed and confirmed: Torsemide 20-40 mg by mouth as directed. 2 tablets daily on Friday, Saturday &Sunday, 1 tablet daily Monday through Thursday.  Labs: 09/05/2016 Creatinine 3.02, BUN 35,    Potassium 4.2, Sodium 138, EGFR 14-17 09/04/2016 Creatinine 2.75, BUN 36,    Potassium 3.7, Sodium 136, EGFR 16-19  08/27/2016 Creatinine 3.02, BUN 42,    Potassium 4.7, Sodium 134  08/20/2016 Creatinine 3.1,   BUN 44.6, Potassium 4.4, Sodium 139  07/30/2016 Creatinine 3.4,   BUN 44,    Potassium 4.2, Sodium 137 05/21/2016 Creatinine 4.3,   BUN 58,    Potassium 4.4, Sodium 134 04/27/2016 Creatinine 3.73, BUN 44,    Potassium 4.8, Sodium 137, EGFR 12.62 04/23/2016 Creatinine 3.74, BUN 49,    Potassium 4.8, Sodium 137, EGFR 12.59  04/20/2016 Creatinine 3.43, BUN 48,    Potassium 4.7, Sodium 137, EGFR 13.91  04/19/2016 Creatinine 3.32, BUN 51,    Potassium 4.6, Sodium 137, EGFR 14.44  01/09/2016 Creatinine 2.33, BUN 61,    Potassium 4.2, Sodium 138  01/08/2016 Creatinine 2.42, BUN 62,    Potassium 4.3, Sodium 139  01/07/2016 Creatinine 2.75, BUN 63,    Potassium 4.7, Sodium 137  01/05/2016 Creatinine 2.62, BUN 65,    Potassium 4.6, Sodium 136  01/04/2016 Creatinine 2.58, BUN 60,    Potassium 4.6, Sodium 135  01/03/2016 Creatinine 2.47, BUN 60,    Potassium 4.6, Sodium 135 01/02/2016 Creatinine 2.83, BUN 55,     Potassium 4.7, Sodium 140  01/01/2016 Creatinine 2.91, BUN 56,    Potassium 4.6, Sodium 137  12/17/2015 Creatinine 2.85, BUN 64,    Potassium 5.0, Sodium 139  12/16/2015 Creatinine 3.16, BUN 67,    Potassium 4.7, Sodium 137  12/15/2015 Creatinine 3.45, BUN 62,    Potassium 5.0, Sodium 136  12/14/2015 Creatinine 3.51, BUN 62,    Potassium 5.3, Sodium 133   Recommendations: No changes. Patient asked if she could decrease the Furosemide due to her incontinence and advised she would should discuss with Dr Haroldine Laws.  She will discuss with Dr Charlett Blake at her appointment tomorrow.  Explained since 07/18/2016 there have been total of 36 days that suggests she's had fluid accumulation.  Advised if Furosemide is decreased then she will need to monitor very closely regarding the amount of salt she eats daily and should limit salt intake to 2000 mg/day.   Encouraged to call for fluid symptoms or use local ER for any urgent symptoms.  Follow-up plan: ICM clinic phone appointment on 10/09/2016.    Copy of ICM check sent to primary cardiologist and device physician.   3 month ICM trend: 09/17/2016   Direct Trend Viewer   1 Year ICM trend:      [name], RN 09/17/2016 8:38 AM

## 2016-09-17 NOTE — Telephone Encounter (Signed)
Received PT Orders d/t Decreased Functional Mobility; forwarded to provider for signature/SLS 05/07

## 2016-09-18 ENCOUNTER — Ambulatory Visit (INDEPENDENT_AMBULATORY_CARE_PROVIDER_SITE_OTHER): Payer: Self-pay | Admitting: Internal Medicine

## 2016-09-18 ENCOUNTER — Ambulatory Visit (INDEPENDENT_AMBULATORY_CARE_PROVIDER_SITE_OTHER): Payer: Medicare Other | Admitting: Family Medicine

## 2016-09-18 ENCOUNTER — Encounter: Payer: Self-pay | Admitting: Family Medicine

## 2016-09-18 DIAGNOSIS — Z8781 Personal history of (healed) traumatic fracture: Secondary | ICD-10-CM | POA: Diagnosis not present

## 2016-09-18 DIAGNOSIS — M8008XD Age-related osteoporosis with current pathological fracture, vertebra(e), subsequent encounter for fracture with routine healing: Secondary | ICD-10-CM

## 2016-09-18 DIAGNOSIS — S22000D Wedge compression fracture of unspecified thoracic vertebra, subsequent encounter for fracture with routine healing: Secondary | ICD-10-CM | POA: Diagnosis not present

## 2016-09-18 DIAGNOSIS — I255 Ischemic cardiomyopathy: Secondary | ICD-10-CM | POA: Diagnosis not present

## 2016-09-18 DIAGNOSIS — M8088XD Other osteoporosis with current pathological fracture, vertebra(e), subsequent encounter for fracture with routine healing: Secondary | ICD-10-CM | POA: Diagnosis not present

## 2016-09-18 DIAGNOSIS — S32020D Wedge compression fracture of second lumbar vertebra, subsequent encounter for fracture with routine healing: Secondary | ICD-10-CM | POA: Diagnosis not present

## 2016-09-18 DIAGNOSIS — I10 Essential (primary) hypertension: Secondary | ICD-10-CM | POA: Diagnosis not present

## 2016-09-18 DIAGNOSIS — Z8673 Personal history of transient ischemic attack (TIA), and cerebral infarction without residual deficits: Secondary | ICD-10-CM

## 2016-09-18 DIAGNOSIS — I480 Paroxysmal atrial fibrillation: Secondary | ICD-10-CM

## 2016-09-18 DIAGNOSIS — N184 Chronic kidney disease, stage 4 (severe): Secondary | ICD-10-CM | POA: Diagnosis not present

## 2016-09-18 DIAGNOSIS — E1122 Type 2 diabetes mellitus with diabetic chronic kidney disease: Secondary | ICD-10-CM | POA: Diagnosis not present

## 2016-09-18 DIAGNOSIS — I130 Hypertensive heart and chronic kidney disease with heart failure and stage 1 through stage 4 chronic kidney disease, or unspecified chronic kidney disease: Secondary | ICD-10-CM | POA: Diagnosis not present

## 2016-09-18 DIAGNOSIS — I2510 Atherosclerotic heart disease of native coronary artery without angina pectoris: Secondary | ICD-10-CM | POA: Diagnosis not present

## 2016-09-18 DIAGNOSIS — Z5181 Encounter for therapeutic drug level monitoring: Secondary | ICD-10-CM

## 2016-09-18 DIAGNOSIS — D696 Thrombocytopenia, unspecified: Secondary | ICD-10-CM

## 2016-09-18 DIAGNOSIS — I5042 Chronic combined systolic (congestive) and diastolic (congestive) heart failure: Secondary | ICD-10-CM | POA: Diagnosis not present

## 2016-09-18 HISTORY — DX: Personal history of (healed) traumatic fracture: Z87.81

## 2016-09-18 LAB — CBC
HEMATOCRIT: 30.5 % — AB (ref 36.0–46.0)
HEMOGLOBIN: 10.1 g/dL — AB (ref 12.0–15.0)
MCHC: 33.2 g/dL (ref 30.0–36.0)
MCV: 95.7 fl (ref 78.0–100.0)
Platelets: 71 10*3/uL — ABNORMAL LOW (ref 150.0–400.0)
RBC: 3.19 Mil/uL — ABNORMAL LOW (ref 3.87–5.11)
RDW: 17.3 % — AB (ref 11.5–15.5)
WBC: 2.3 10*3/uL — ABNORMAL LOW (ref 4.0–10.5)

## 2016-09-18 LAB — POCT INR: INR: 3.2

## 2016-09-18 NOTE — Assessment & Plan Note (Signed)
Well controlled, no changes to meds. Encouraged heart healthy diet such as the DASH diet and exercise as tolerated.  °

## 2016-09-18 NOTE — Assessment & Plan Note (Signed)
Good response to Kyphoplasty. Pain now much better.

## 2016-09-18 NOTE — Assessment & Plan Note (Signed)
Encouraged DASH diet, decrease po intake and increase exercise as tolerated. Needs 7-8 hours of sleep nightly. Avoid trans fats, eat small, frequent meals every 4-5 hours with lean proteins, complex carbs and healthy fats. Minimize simple carbs, consider bariatric consult 

## 2016-09-18 NOTE — Progress Notes (Signed)
Pre visit review using our clinic review tool, if applicable. No additional management support is needed unless otherwise documented below in the visit note. 

## 2016-09-18 NOTE — Assessment & Plan Note (Signed)
hgba1c acceptable, minimize simple carbs. Increase exercise as tolerated. Continue current meds, has had to hold the insulin due to low appetite a couple times and no spikes or serious drops. Highest in last week 170, lowest 44 when did not skip insulin. Report if appetite does not return

## 2016-09-18 NOTE — Assessment & Plan Note (Deleted)
Good response to Kyphoplasty. Pain now much better.

## 2016-09-18 NOTE — Patient Instructions (Addendum)
Encouraged increased hydration and fiber in diet. Daily probiotics. If bowels not moving can use MOM 2 tbls po in 4 oz of warm prune juice by mouth every 2-3 days. If no results then call for further consideration Constipation, Adult Constipation is when a person has fewer bowel movements in a week than normal, has difficulty having a bowel movement, or has stools that are dry, hard, or larger than normal. Constipation may be caused by an underlying condition. It may become worse with age if a person takes certain medicines and does not take in enough fluids. Follow these instructions at home: Eating and drinking    Eat foods that have a lot of fiber, such as fresh fruits and vegetables, whole grains, and beans.  Limit foods that are high in fat, low in fiber, or overly processed, such as french fries, hamburgers, cookies, candies, and soda.  Drink enough fluid to keep your urine clear or pale yellow. General instructions   Exercise regularly or as told by your health care provider.  Go to the restroom when you have the urge to go. Do not hold it in.  Take over-the-counter and prescription medicines only as told by your health care provider. These include any fiber supplements.  Practice pelvic floor retraining exercises, such as deep breathing while relaxing the lower abdomen and pelvic floor relaxation during bowel movements.  Watch your condition for any changes.  Keep all follow-up visits as told by your health care provider. This is important. Contact a health care provider if:  You have pain that gets worse.  You have a fever.  You do not have a bowel movement after 4 days.  You vomit.  You are not hungry.  You lose weight.  You are bleeding from the anus.  You have thin, pencil-like stools. Get help right away if:  You have a fever and your symptoms suddenly get worse.  You leak stool or have blood in your stool.  Your abdomen is bloated.  You have severe pain  in your abdomen.  You feel dizzy or you faint. This information is not intended to replace advice given to you by your health care provider. Make sure you discuss any questions you have with your health care provider. Document Released: 01/27/2004 Document Revised: 11/18/2015 Document Reviewed: 10/19/2015 Elsevier Interactive Patient Education  2017 [place] in 4 hours with  Dulcolax suppository pr, may repeat again in 4 more hours as needed. Seek care if symptoms worsen. Consider daily Miralax and/or Dulcolax if symptoms persist.   MIralax/Benefiber twice daily as a baseline

## 2016-09-18 NOTE — Progress Notes (Signed)
[name], female   DOB: [birth year], 76 y.o.   MRN: [id number]   Subjective:  I acted as a Education administrator for Penni Homans, MD. [place], [place]   [name], female    DOB: 25-Oct-1940, 76 y.o.   MRN: [id number]  Chief Complaint  Patient presents with  . Hospitalization Follow-up    Admitted on 09/04/2016.    HPI  Patient is in today for a Hospital follow up. Patient was admitted into the Hospital on 09/04/2016 for chronic right-sided low back pain. Also states that she had surgery on 09/11/2016. Patient has a Hx of HTN, OSA, chronic systolic heart failure, obesity, hyperlipidemia. Patient has no additional acute concerns noted at this time. After a fall at home she suffered from a lumbar vertebral fracture and pain was uncontrollable after a kyphoplasty with Dr Estanislado Pandy of IR her pain is well controlled. Still has some sciatica symptoms but they are improved. Her appetite is diminished and her bowels have been sluggish but no n/v. Only intermittent cramping. No bloody or tarry stool. Denies CP/palp/HA/congestion/fevers or GU c/o. Taking meds as prescribed  Patient Care Team: Mosie Lukes, MD as PCP - General (Family Medicine) Donato Heinz, MD as Consulting Physician (Nephrology) Evans Lance, MD as Consulting Physician (Cardiology) Larey Dresser, MD as Consulting Physician (Cardiology) Jacelyn Pi, MD as Consulting Physician (Endocrinology) Rigoberto Noel, MD as Consulting Physician (Pulmonary Disease)   Past Medical History:  Diagnosis Date  . AICD (automatic cardioverter/defibrillator) present   . Anemia, unspecified 12/13/2013  . Back pain, chronic    "just when I walk; mass on 3rd and 4th vertebrae right lower back"  . Biventricular implantable cardiac defibrillator in situ 2007, 2012   a. 2007;  b. 2012 Gen change: SJM 3231-40 Uni BiV ICD, ser # [id number].  Marland Kitchen CAD (coronary artery disease)    a. 05/2001 CABG x4: LIMA->LAD, VG->D1, VG->D2,  VG->RCA.  . Cardiomyopathy, ischemic    a. 2012 s/p SJM 3231-40 Uni BiV ICD, ser # [id number].  . Carotid stenosis    a. 10/19/2011 carotid duplex - Mild hard plaque bilaterally. Stable 40-59% bilateral ICA stenosis. Carotid US (04/2013):  Bilateral 40-59% ICA.  F/u 1 year  . Cerumen impaction 10/28/2015  . Chronic combined systolic and diastolic CHF (congestive heart failure) (HCC)    a. EF as low as 25% in 2006;  b. EF 60-65% in 12/2011;  c. 02/2013 Echo: EF 35-40, mod mid-dist antsept HK, Gr 2 DD, mild LVH.  . CKD (chronic kidney disease), stage IV ([place])    hx/notes 09/04/2016  . COPD (chronic obstructive pulmonary disease) ([place])   . COPD, severe ([place]) 01/22/2016  . 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 Dyslipidemia   . Gout ~ 08/2011  . History of vertebral fracture 09/18/2016  . Hypercalcemia 09/26/2013  . Hypertension   . Hypothyroidism   . Hypoxemia requiring supplemental oxygen   . LBBB (left bundle branch block)   . Lumbar compression fracture ([place])    L2/notes 09/04/2016  . Morbid obesity with BMI of 45.0-49.9, adult (HCC)    Ht. 5'4". BMI 47.2  . Mural thrombus of left ventricle    before 2003, while on Coumadin, No h/o CVA  . 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 Benay Spice, in remission  . On home oxygen therapy    "normally 2-3L; 24/7" (12/14/2015); "2L; 24/7" (  09/04/2016)  . OSA on CPAP   . PAF (paroxysmal atrial fibrillation) (HCC)    on Coumadin  . Presence of permanent cardiac pacemaker   . Retinopathy due to secondary diabetes ([place])    type II, uncontrolled  . Urinary incontinence 06/25/2016  . Vertebral fracture, osteoporotic ([place]) 08/27/2016  . Vitamin D deficiency 06/09/2012   historical    Past Surgical History:  Procedure Laterality Date  . ABDOMINAL HYSTERECTOMY  1982  . CARDIAC CATHETERIZATION  06/03/01  . CARDIOVERSION N/A 07/30/2014   Procedure:  CARDIOVERSION;  Surgeon: Jolaine Artist, MD;  Location: [facility] ENDOSCOPY;  Service: Cardiovascular;  Laterality: N/A;  . CARDIOVERSION N/A 11/25/2014   Procedure: CARDIOVERSION;  Surgeon: Evans Lance, MD;  Location: [place];  Service: Cardiovascular;  Laterality: N/A;  . CATARACT EXTRACTION     left eye  . CHOLECYSTECTOMY  1970  . CORONARY ARTERY BYPASS GRAFT  2003   CABG X4  . INSERT / REPLACE / REMOVE PACEMAKER  2007; 2012   w/AICD  . IR KYPHO LUMBAR INC FX REDUCE BONE BX UNI/BIL CANNULATION INC/IMAGING  09/11/2016  . REFRACTIVE SURGERY     right eye  . TEE WITHOUT CARDIOVERSION N/A 12/16/2015   Procedure: TRANSESOPHAGEAL ECHOCARDIOGRAM (TEE);  Surgeon: Jolaine Artist, MD;  Location: [facility] ENDOSCOPY;  Service: Cardiovascular;  Laterality: N/A;  . TUBAL LIGATION  1972    Family History  Problem Relation Age of Onset  . Kidney cancer Mother     kidney and female repo - died @ 38  . Asthma Mother   . Cancer Mother     gyn and renal  . Heart disease Mother     CAD  . Heart disease Father     died @ 51  . Stroke Father   . Diabetes Father   . Hypertension Father   . Hyperlipidemia Father   . Heart attack Father   . Hypertension Son   . Kidney cancer Maternal Uncle   . Cancer Maternal Uncle   . Cirrhosis Maternal Grandmother     non alcohol  . Cancer Maternal Grandfather   . Kidney cancer Maternal Grandfather   . Hypertension Maternal Aunt     Social History   Social History  . Marital status: Married    Spouse name: N/A  . Number of children: Y  . Years of education: N/A   Occupational History  . 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    Social History Main Topics  . Smoking status: Never Smoker  . Smokeless tobacco: Never Used  . Alcohol use No  . Drug use: No  . Sexual activity: Not Currently     Comment: lives with husband, no major dietary restrictions   Other Topics Concern  . Not on file   Social History Narrative   Lives in [place]  with her husband.  She does not routinely exercise or adhere to any particular diet.      Outpatient Medications Prior to Visit  Medication Sig Dispense Refill  . acetaminophen (TYLENOL) 500 MG tablet Take 1,000 mg by mouth 2 (two) times daily as needed for mild pain.     Marland Kitchen albuterol (PROVENTIL HFA;VENTOLIN HFA) 108 (90 BASE) MCG/ACT inhaler Inhale 2 puffs into the lungs every 6 (six) hours as needed for wheezing or shortness of breath. 1 Inhaler 1  . amiodarone (PACERONE) 200 MG tablet Take 200 mg by mouth daily.    . carvedilol (COREG) 25 MG  tablet Take 1 tablet (25 mg total) by mouth 2 (two) times daily. 360 tablet 3  . escitalopram (LEXAPRO) 10 MG tablet Take 10 mg by mouth daily.    . fluticasone (FLONASE) 50 MCG/ACT nasal spray Place 2 sprays into both nostrils daily as needed for allergies or rhinitis. As needed for nasal stuffiness. (Patient taking differently: Place 2 sprays into both nostrils daily as needed for allergies or rhinitis. ) 16 g 0  . gabapentin (NEURONTIN) 300 MG capsule Take 300 mg by mouth at bedtime.    Marland Kitchen glucose blood (FREESTYLE LITE) test strip DX: 250.60  Check sugars bid and as needed (Patient taking differently: 1 each by Other route See admin instructions. Check blood sugar twice daily) 100 each 2  . insulin NPH (HUMULIN N,NOVOLIN N) 100 UNIT/ML injection Inject 7-40 Units into the skin See admin instructions. 40 units in the morning before breakfast then 7 units in the evening before supper/evening meal    . insulin regular (HUMULIN R) 250 units/2.51m (100 units/mL) injection Inject 5 Units into the skin daily before breakfast.    . levothyroxine (SYNTHROID, LEVOTHROID) 25 MCG tablet Take 25 mcg by mouth daily before breakfast.    . LORazepam (ATIVAN) 0.5 MG tablet Take 1 tablet (0.5 mg total) by mouth at bedtime. 90 tablet 1  . OXYGEN Inhale 3 L into the lungs continuous.     . torsemide (DEMADEX) 20 MG tablet Take 20-40 mg by mouth See admin instructions. 40 mg  in the morning on Sun/Fri/Sat and 20 mg on Mon/Tues/Wed/Thurs    . warfarin (COUMADIN) 5 MG tablet TAKE AS DIRECTED BY ANTICOAGULATION CLINIC (Patient taking differently: Take 5 mg by mouth in the morning on Sun/Tues/Wed/Fr/Sat and 2.5 mg on Mon/Thurs) 70 tablet 1  . oxyCODONE-acetaminophen (PERCOCET/ROXICET) 5-325 MG tablet Take 1 tablet by mouth every 8 (eight) hours as needed for severe pain. (Patient not taking: Reported on 09/18/2016) 8 tablet 0   No facility-administered medications prior to visit.     Allergies  Allergen Reactions  . Sulfonamide Derivatives Other (See Comments)    Reaction unknown; "childhood allergy/mother"    Review of Systems  Constitutional: Positive for malaise/fatigue. Negative for fever.  HENT: Negative for congestion.   Eyes: Negative for blurred vision.  Respiratory: Negative for cough and shortness of breath.   Cardiovascular: Negative for chest pain, palpitations and leg swelling.  Gastrointestinal: Negative for vomiting.  Musculoskeletal: Positive for back pain.  Skin: Negative for rash.  Neurological: Negative for loss of consciousness and headaches.  Endo/Heme/Allergies: Bruises/bleeds easily.       Objective:    Physical Exam  Constitutional: She is oriented to person, place, and time. She appears well-developed and well-nourished. No distress.  HENT:  Head: Normocephalic and atraumatic.  Nose: Nose normal.  Eyes: Conjunctivae are normal. Right eye exhibits no discharge. Left eye exhibits no discharge.  Neck: Normal range of motion. Neck supple. No thyromegaly present.  Cardiovascular: Normal rate and regular rhythm.   No murmur heard. Pulmonary/Chest: Effort normal and breath sounds normal. She has no wheezes.  Abdominal: Soft. Bowel sounds are normal. There is no tenderness.  Musculoskeletal: She exhibits no edema or deformity.  Neurological: She is alert and oriented to person, place, and time.  Skin: Skin is warm and dry. She is not  diaphoretic.  Psychiatric: She has a normal mood and affect.  Nursing note and vitals reviewed.   BP 107/83 (BP Location: Left Wrist, Patient Position: Sitting, Cuff Size: Normal)  Pulse 70   Temp 98.6 F (37 C) (Oral)   Wt 227 lb 12.8 oz (103.3 kg)   SpO2 100% Comment: O2  BMI 39.10 kg/m  Wt Readings from Last 3 Encounters:  09/18/16 227 lb 12.8 oz (103.3 kg)  09/10/16 228 lb (103.4 kg)  09/04/16 223 lb (101.2 kg)   BP Readings from Last 3 Encounters:  09/18/16 107/83  09/11/16 127/88  09/10/16 (!) 128/58     Immunization History  Administered Date(s) Administered  . H1N1 05/17/2008  . Influenza Split 01/29/2012  . Influenza Whole 02/03/2007, 02/11/2009, 01/17/2010, 01/26/2013  . Influenza,inj,Quad PF,36+ Mos 01/25/2016  . Influenza-Unspecified 01/12/2014, 02/13/2015  . Pneumococcal Conjugate-13 03/03/2015  . Pneumococcal Polysaccharide-23 12/23/2007, 03/11/2013  . Td 09/29/2001, 01/28/2012  . Tdap 10/01/2011  . Zoster 08/19/2007    Health Maintenance  Topic Date Due  . MAMMOGRAM  01/11/2017 (Originally 01/03/2013)  . DEXA SCAN  01/11/2017 (Originally 09/20/2007)  . URINE MICROALBUMIN  06/25/2017 (Originally 03/27/2012)  . INFLUENZA VACCINE  12/12/2016  . HEMOGLOBIN A1C  02/26/2017  . FOOT EXAM  06/25/2017  . OPHTHALMOLOGY EXAM  09/18/2017  . COLONOSCOPY  04/02/2021  . TETANUS/TDAP  01/27/2022  . PNA vac Low Risk Adult  Completed    Lab Results  Component Value Date   WBC 3.5 (L) 09/11/2016   HGB 10.4 (L) 09/11/2016   HCT 32.1 (L) 09/11/2016   PLT 69 (L) 09/11/2016   GLUCOSE 124 (H) 09/10/2016   CHOL 154 09/05/2016   TRIG 226 (H) 09/05/2016   HDL 21 (L) 09/05/2016   LDLDIRECT 93.0 08/27/2016   LDLCALC 88 09/05/2016   ALT 19 09/05/2016   AST 34 09/05/2016   NA 135 09/10/2016   K 4.9 09/10/2016   CL 102 09/10/2016   CREATININE 2.32 (H) 09/10/2016   BUN 31 (H) 09/10/2016   CO2 27 09/10/2016   TSH 7.98 (H) 08/27/2016   INR 1.42 09/10/2016   HGBA1C  6.2 08/27/2016   MICROALBUR 77.0 (H) 03/28/2011    Lab Results  Component Value Date   TSH 7.98 (H) 08/27/2016   Lab Results  Component Value Date   WBC 3.5 (L) 09/11/2016   HGB 10.4 (L) 09/11/2016   HCT 32.1 (L) 09/11/2016   MCV 94.7 09/11/2016   PLT 69 (L) 09/11/2016   Lab Results  Component Value Date   NA 135 09/10/2016   K 4.9 09/10/2016   CHLORIDE 102 08/20/2016   CO2 27 09/10/2016   GLUCOSE 124 (H) 09/10/2016   BUN 31 (H) 09/10/2016   CREATININE 2.32 (H) 09/10/2016   BILITOT 1.3 (H) 09/05/2016   ALKPHOS 110 09/05/2016   AST 34 09/05/2016   ALT 19 09/05/2016   PROT 5.6 (L) 09/05/2016   ALBUMIN 2.7 (L) 09/05/2016   CALCIUM 9.3 09/10/2016   ANIONGAP 6 09/10/2016   EGFR 14 (L) 08/20/2016   GFR 16.09 (L) 08/27/2016   Lab Results  Component Value Date   CHOL 154 09/05/2016   Lab Results  Component Value Date   HDL 21 (L) 09/05/2016   Lab Results  Component Value Date   LDLCALC 88 09/05/2016   Lab Results  Component Value Date   TRIG 226 (H) 09/05/2016   Lab Results  Component Value Date   CHOLHDL 7.3 09/05/2016   Lab Results  Component Value Date   HGBA1C 6.2 08/27/2016         Assessment & Plan:   Problem List Items Addressed This Visit    Controlled type 2  diabetes mellitus with chronic kidney disease (HCC) (Chronic)    hgba1c acceptable, minimize simple carbs. Increase exercise as tolerated. Continue current meds, has had to hold the insulin due to low appetite a couple times and no spikes or serious drops. Highest in last week 170, lowest 44 when did not skip insulin. Report if appetite does not return      Morbid obesity (HCC) (Chronic)    Encouraged DASH diet, decrease po intake and increase exercise as tolerated. Needs 7-8 hours of sleep nightly. Avoid trans fats, eat small, frequent meals every 4-5 hours with lean proteins, complex carbs and healthy fats. Minimize simple carbs, consider bariatric consult.      Essential hypertension      Well controlled, no changes to meds. Encouraged heart healthy diet such as the DASH diet and exercise as tolerated.       CKD (chronic kidney disease) stage 4, GFR 15-29 ml/min (HCC)    Follows with [facility]. stable      RESOLVED: Vertebral fracture, osteoporotic (HCC)   Thrombocytopenia ([place])    Worse recently. Check cbc today      Relevant Orders   CBC   History of vertebral fracture    Good response to Kyphoplasty. Pain now much better.          I have discontinued Ms. Reiling's oxyCODONE-acetaminophen. I am also having her maintain her albuterol, insulin NPH Human, glucose blood, fluticasone, acetaminophen, OXYGEN, amiodarone, warfarin, LORazepam, gabapentin, escitalopram, torsemide, carvedilol, insulin regular, and levothyroxine.  No orders of the defined types were placed in this encounter.   CMA served as Education administrator during this visit. History, Physical and Plan performed by medical provider. Documentation and orders reviewed and attested to.  Penni Homans, MD

## 2016-09-18 NOTE — Assessment & Plan Note (Signed)
Worse recently. Check cbc today

## 2016-09-18 NOTE — Assessment & Plan Note (Signed)
Follows with [facility] stable 

## 2016-09-20 ENCOUNTER — Telehealth: Payer: Self-pay | Admitting: Family Medicine

## 2016-09-20 DIAGNOSIS — E1122 Type 2 diabetes mellitus with diabetic chronic kidney disease: Secondary | ICD-10-CM | POA: Diagnosis not present

## 2016-09-20 DIAGNOSIS — S32020D Wedge compression fracture of second lumbar vertebra, subsequent encounter for fracture with routine healing: Secondary | ICD-10-CM | POA: Diagnosis not present

## 2016-09-20 DIAGNOSIS — S22000D Wedge compression fracture of unspecified thoracic vertebra, subsequent encounter for fracture with routine healing: Secondary | ICD-10-CM | POA: Diagnosis not present

## 2016-09-20 DIAGNOSIS — I5042 Chronic combined systolic (congestive) and diastolic (congestive) heart failure: Secondary | ICD-10-CM | POA: Diagnosis not present

## 2016-09-20 DIAGNOSIS — I130 Hypertensive heart and chronic kidney disease with heart failure and stage 1 through stage 4 chronic kidney disease, or unspecified chronic kidney disease: Secondary | ICD-10-CM | POA: Diagnosis not present

## 2016-09-20 DIAGNOSIS — I2510 Atherosclerotic heart disease of native coronary artery without angina pectoris: Secondary | ICD-10-CM | POA: Diagnosis not present

## 2016-09-20 NOTE — Telephone Encounter (Signed)
Caller name: Anne Ng  Relation to pt: PT from [facility]  Call back number: [phone]   Reason for call:  Patient cancelled OT and PT for this week and only allowed Nursing visit this week due to patient not feeling well, patient states she would like to possible resume Monday or Tuesday OT and PT.

## 2016-09-21 NOTE — Telephone Encounter (Signed)
Noted.   I called PT from [facility] she just wanted [place] to know that the pt refused PT and OT and she will try again next week.   PC

## 2016-09-25 ENCOUNTER — Telehealth (HOSPITAL_COMMUNITY): Payer: Self-pay

## 2016-09-25 ENCOUNTER — Ambulatory Visit (INDEPENDENT_AMBULATORY_CARE_PROVIDER_SITE_OTHER): Payer: Self-pay | Admitting: Cardiology

## 2016-09-25 DIAGNOSIS — I480 Paroxysmal atrial fibrillation: Secondary | ICD-10-CM

## 2016-09-25 DIAGNOSIS — I130 Hypertensive heart and chronic kidney disease with heart failure and stage 1 through stage 4 chronic kidney disease, or unspecified chronic kidney disease: Secondary | ICD-10-CM | POA: Diagnosis not present

## 2016-09-25 DIAGNOSIS — I2510 Atherosclerotic heart disease of native coronary artery without angina pectoris: Secondary | ICD-10-CM | POA: Diagnosis not present

## 2016-09-25 DIAGNOSIS — S32020D Wedge compression fracture of second lumbar vertebra, subsequent encounter for fracture with routine healing: Secondary | ICD-10-CM | POA: Diagnosis not present

## 2016-09-25 DIAGNOSIS — Z5181 Encounter for therapeutic drug level monitoring: Secondary | ICD-10-CM

## 2016-09-25 DIAGNOSIS — Z8673 Personal history of transient ischemic attack (TIA), and cerebral infarction without residual deficits: Secondary | ICD-10-CM

## 2016-09-25 DIAGNOSIS — E1122 Type 2 diabetes mellitus with diabetic chronic kidney disease: Secondary | ICD-10-CM | POA: Diagnosis not present

## 2016-09-25 DIAGNOSIS — S22000D Wedge compression fracture of unspecified thoracic vertebra, subsequent encounter for fracture with routine healing: Secondary | ICD-10-CM | POA: Diagnosis not present

## 2016-09-25 DIAGNOSIS — I5042 Chronic combined systolic (congestive) and diastolic (congestive) heart failure: Secondary | ICD-10-CM | POA: Diagnosis not present

## 2016-09-25 LAB — POCT INR: INR: 5.8

## 2016-09-25 NOTE — Telephone Encounter (Signed)
Called to schedule 2 wk f/u, left message for pt to return call. AW 

## 2016-09-26 DIAGNOSIS — E1122 Type 2 diabetes mellitus with diabetic chronic kidney disease: Secondary | ICD-10-CM | POA: Diagnosis not present

## 2016-09-26 DIAGNOSIS — S22000D Wedge compression fracture of unspecified thoracic vertebra, subsequent encounter for fracture with routine healing: Secondary | ICD-10-CM | POA: Diagnosis not present

## 2016-09-26 DIAGNOSIS — I130 Hypertensive heart and chronic kidney disease with heart failure and stage 1 through stage 4 chronic kidney disease, or unspecified chronic kidney disease: Secondary | ICD-10-CM | POA: Diagnosis not present

## 2016-09-26 DIAGNOSIS — I5042 Chronic combined systolic (congestive) and diastolic (congestive) heart failure: Secondary | ICD-10-CM | POA: Diagnosis not present

## 2016-09-26 DIAGNOSIS — S32020D Wedge compression fracture of second lumbar vertebra, subsequent encounter for fracture with routine healing: Secondary | ICD-10-CM | POA: Diagnosis not present

## 2016-09-26 DIAGNOSIS — I2510 Atherosclerotic heart disease of native coronary artery without angina pectoris: Secondary | ICD-10-CM | POA: Diagnosis not present

## 2016-09-28 ENCOUNTER — Emergency Department (HOSPITAL_COMMUNITY)
Admission: EM | Admit: 2016-09-28 | Discharge: 2016-09-28 | Disposition: A | Discharge status: Home / Self Care | Payer: Medicare Other | Attending: Emergency Medicine | Admitting: Emergency Medicine

## 2016-09-28 ENCOUNTER — Telehealth: Payer: Self-pay | Admitting: Family Medicine

## 2016-09-28 ENCOUNTER — Ambulatory Visit: Payer: Medicare Other | Admitting: Medical

## 2016-09-28 ENCOUNTER — Emergency Department (HOSPITAL_COMMUNITY): Payer: Medicare Other

## 2016-09-28 ENCOUNTER — Encounter (HOSPITAL_COMMUNITY): Payer: Self-pay | Admitting: Emergency Medicine

## 2016-09-28 DIAGNOSIS — Z794 Long term (current) use of insulin: Secondary | ICD-10-CM | POA: Diagnosis not present

## 2016-09-28 DIAGNOSIS — R197 Diarrhea, unspecified: Secondary | ICD-10-CM | POA: Diagnosis not present

## 2016-09-28 DIAGNOSIS — Z8673 Personal history of transient ischemic attack (TIA), and cerebral infarction without residual deficits: Secondary | ICD-10-CM | POA: Diagnosis not present

## 2016-09-28 DIAGNOSIS — I2510 Atherosclerotic heart disease of native coronary artery without angina pectoris: Secondary | ICD-10-CM | POA: Insufficient documentation

## 2016-09-28 DIAGNOSIS — Z7901 Long term (current) use of anticoagulants: Secondary | ICD-10-CM | POA: Insufficient documentation

## 2016-09-28 DIAGNOSIS — J449 Chronic obstructive pulmonary disease, unspecified: Secondary | ICD-10-CM | POA: Diagnosis not present

## 2016-09-28 DIAGNOSIS — Z95810 Presence of automatic (implantable) cardiac defibrillator: Secondary | ICD-10-CM | POA: Diagnosis not present

## 2016-09-28 DIAGNOSIS — I5042 Chronic combined systolic (congestive) and diastolic (congestive) heart failure: Secondary | ICD-10-CM | POA: Diagnosis not present

## 2016-09-28 DIAGNOSIS — R1084 Generalized abdominal pain: Secondary | ICD-10-CM | POA: Diagnosis not present

## 2016-09-28 DIAGNOSIS — E039 Hypothyroidism, unspecified: Secondary | ICD-10-CM | POA: Diagnosis not present

## 2016-09-28 DIAGNOSIS — E1122 Type 2 diabetes mellitus with diabetic chronic kidney disease: Secondary | ICD-10-CM | POA: Diagnosis not present

## 2016-09-28 DIAGNOSIS — Z951 Presence of aortocoronary bypass graft: Secondary | ICD-10-CM | POA: Diagnosis not present

## 2016-09-28 DIAGNOSIS — R188 Other ascites: Secondary | ICD-10-CM | POA: Diagnosis not present

## 2016-09-28 DIAGNOSIS — N184 Chronic kidney disease, stage 4 (severe): Secondary | ICD-10-CM | POA: Insufficient documentation

## 2016-09-28 DIAGNOSIS — R109 Unspecified abdominal pain: Secondary | ICD-10-CM

## 2016-09-28 DIAGNOSIS — I130 Hypertensive heart and chronic kidney disease with heart failure and stage 1 through stage 4 chronic kidney disease, or unspecified chronic kidney disease: Secondary | ICD-10-CM | POA: Insufficient documentation

## 2016-09-28 LAB — COMPREHENSIVE METABOLIC PANEL
ALT: 14 U/L (ref 14–54)
AST: 31 U/L (ref 15–41)
Albumin: 3 g/dL — ABNORMAL LOW (ref 3.5–5.0)
Alkaline Phosphatase: 124 U/L (ref 38–126)
Anion gap: 8 (ref 5–15)
BUN: 20 mg/dL (ref 6–20)
CHLORIDE: 103 mmol/L (ref 101–111)
CO2: 26 mmol/L (ref 22–32)
CREATININE: 2.19 mg/dL — AB (ref 0.44–1.00)
Calcium: 9 mg/dL (ref 8.9–10.3)
GFR calc Af Amer: 24 mL/min — ABNORMAL LOW (ref >60)
GFR calc non Af Amer: 21 mL/min — ABNORMAL LOW (ref >60)
Glucose, Bld: 173 mg/dL — ABNORMAL HIGH (ref 65–99)
POTASSIUM: 4.4 mmol/L (ref 3.5–5.1)
SODIUM: 137 mmol/L (ref 135–145)
Total Bilirubin: 1.3 mg/dL — ABNORMAL HIGH (ref 0.3–1.2)
Total Protein: 6 g/dL — ABNORMAL LOW (ref 6.5–8.1)

## 2016-09-28 LAB — CBC
HEMATOCRIT: 33.5 % — AB (ref 36.0–46.0)
Hemoglobin: 10.8 g/dL — ABNORMAL LOW (ref 12.0–15.0)
MCH: 30.9 pg (ref 26.0–34.0)
MCHC: 32.2 g/dL (ref 30.0–36.0)
MCV: 96 fL (ref 78.0–100.0)
PLATELETS: 76 10*3/uL — AB (ref 150–400)
RBC: 3.49 MIL/uL — AB (ref 3.87–5.11)
RDW: 17.1 % — ABNORMAL HIGH (ref 11.5–15.5)
WBC: 3.4 10*3/uL — ABNORMAL LOW (ref 4.0–10.5)

## 2016-09-28 LAB — PROTIME-INR
INR: 2.12
Prothrombin Time: 24.1 seconds — ABNORMAL HIGH (ref 11.4–15.2)

## 2016-09-28 LAB — LIPASE, BLOOD: LIPASE: 123 U/L — AB (ref 11–51)

## 2016-09-28 MED ORDER — DIPHENOXYLATE-ATROPINE 2.5-0.025 MG PO TABS: 1.0000 | ORAL_TABLET | Freq: Four times a day (QID) | ORAL | 0 refills | Status: DC | PRN

## 2016-09-28 MED ORDER — TRAMADOL HCL 50 MG PO TABS: 50.0000 mg | ORAL_TABLET | Freq: Three times a day (TID) | ORAL | 0 refills | Status: DC | PRN

## 2016-09-28 MED ORDER — DICYCLOMINE HCL 20 MG PO TABS: 20.0000 mg | ORAL_TABLET | Freq: Two times a day (BID) | ORAL | 0 refills | Status: DC | PRN

## 2016-09-28 MED ORDER — MORPHINE SULFATE (PF) 4 MG/ML IV SOLN
2.0000 mg | Freq: Once | INTRAVENOUS | Status: AC
Start: 2016-09-28 — End: 2016-09-28
  Administered 2016-09-28: 2 mg via INTRAVENOUS
  Filled 2016-09-28: qty 1

## 2016-09-28 MED ORDER — ONDANSETRON HCL 4 MG/2ML IJ SOLN
4.0000 mg | Freq: Once | INTRAMUSCULAR | Status: AC
  Administered 2016-09-28: 4 mg via INTRAVENOUS
  Filled 2016-09-28: qty 2

## 2016-09-28 MED ORDER — SODIUM CHLORIDE 0.9 % IV BOLUS (SEPSIS)
500.0000 mL | Freq: Once | INTRAVENOUS | Status: AC
  Administered 2016-09-28: 500 mL via INTRAVENOUS

## 2016-09-28 NOTE — Telephone Encounter (Signed)
Patient's spouse called patient is having loose stools and cannot get them to stop wanting to know what they can try to get this resolved

## 2016-09-28 NOTE — ED Triage Notes (Addendum)
States had surgery on her back may 1 and she was given antiobiotics , she reaction to them and had diarrhea, now diarrhea is worse  And she has lower abd pain and she feels like she has no control, is on home o2 2-3 ml [place]. pt  Is on coumadin  For heart issues, denies dysuria or blood in stool

## 2016-09-28 NOTE — ED Provider Notes (Signed)
[facility] DEPT Provider Note   CSN: [id number] Arrival date & time: 09/28/16  1403     History   Chief Complaint Chief Complaint  Patient presents with  . Diarrhea    HPI Destiny [name] is a 76 y.o. female.  HPI Patient presents with several weeks of loose stools. Denies blood in the stool. She's had episodic abdominal pain but states her abdominal pain has improved. She had kyphoplasty done on 5/1 and thinks that the diarrhea worsened after the procedure. Patient states she had several doses of Ancef and Keflex during that time. She thinks that the antibiotics may have caused the diarrhea. He's had no fever or chills. Denies nausea or vomiting. Denies blood in her stool. Has intermittently used Kaopectate with little improvement. Denies weakness or numbness. States her low back pain has improved since the surgery. Past Medical History:  Diagnosis Date  . AICD (automatic cardioverter/defibrillator) present   . Anemia, unspecified 12/13/2013  . Back pain, chronic    "just when I walk; mass on 3rd and 4th vertebrae right lower back"  . Biventricular implantable cardiac defibrillator in situ 2007, 2012   a. 2007;  b. 2012 Gen change: SJM 3231-40 Uni BiV ICD, ser # [id number].  Marland Kitchen CAD (coronary artery disease)    a. 05/2001 CABG x4: LIMA->LAD, VG->D1, VG->D2, VG->RCA.  . Cardiomyopathy, ischemic    a. 2012 s/p SJM 3231-40 Uni BiV ICD, ser # [id number].  . Carotid stenosis    a. 10/19/2011 carotid duplex - Mild hard plaque bilaterally. Stable 40-59% bilateral ICA stenosis. Carotid US (04/2013):  Bilateral 40-59% ICA.  F/u 1 year  . Cerumen impaction 10/28/2015  . Chronic combined systolic and diastolic CHF (congestive heart failure) (HCC)    a. EF as low as 25% in 2006;  b. EF 60-65% in 12/2011;  c. 02/2013 Echo: EF 35-40, mod mid-dist antsept HK, Gr 2 DD, mild LVH.  . CKD (chronic kidney disease), stage IV ([place])    hx/notes 09/04/2016  . COPD (chronic obstructive pulmonary disease) ([place])   .  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 Dyslipidemia   . Gout ~ 08/2011  . History of vertebral fracture 09/18/2016  . Hypercalcemia 09/26/2013  . Hypertension   . Hypothyroidism   . Hypoxemia requiring supplemental oxygen   . LBBB (left bundle branch block)   . Lumbar compression fracture ([place])    L2/notes 09/04/2016  . Morbid obesity with BMI of 45.0-49.9, adult (HCC)    Ht. 5'4". BMI 47.2  . Mural thrombus of left ventricle    before 2003, while on Coumadin, No h/o CVA  . 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 Benay Spice, in remission  . On home oxygen therapy    "normally 2-3L; 24/7" (12/14/2015); "2L; 24/7" (09/04/2016)  . OSA on CPAP   . PAF (paroxysmal atrial fibrillation) (HCC)    on Coumadin  . Presence of permanent cardiac pacemaker   . 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 Urinary incontinence 06/25/2016  . Vertebral fracture, osteoporotic ([place]) 08/27/2016  . Vitamin D deficiency 06/09/2012   historical    Patient Active Problem List   Diagnosis Date Noted  . History of vertebral fracture 09/18/2016  . Closed compression fracture of L2 lumbar vertebra ([place])   . Thrombocytopenia ([place])   . Compression fracture of body  of thoracic vertebra ([place]) 09/04/2016  . Back pain 09/04/2016  . Urinary incontinence 06/25/2016  . Diabetic neuropathy ([place]) 03/01/2016  . Current use of insulin ([place]) 03/01/2016  . CKD (chronic kidney disease) stage 4, GFR 15-29 ml/min (HCC) 03/01/2016  . Skin lesion of back 02/18/2016  . COPD, severe ([place]) 01/22/2016  . Obesity hypoventilation syndrome ([place]) 01/01/2016  . Debility 12/14/2015  . Chronic respiratory failure ([place]) 10/25/2014  . Medicare annual wellness visit, subsequent 09/12/2014  . Coagulopathy ([place]) 07/29/2014  . Morbid obesity ([place]) 07/29/2014  . Controlled type 2  diabetes mellitus with chronic kidney disease ([place]) 07/28/2014  . Anemia 12/13/2013  . DOE (dyspnea on exertion) 11/30/2013  . Edema 09/26/2013  . Hypercalcemia 09/26/2013  . Encounter for therapeutic drug monitoring 06/10/2013  . Chronic systolic heart failure ([place]) 05/01/2013  . Chronic combined systolic and diastolic CHF (congestive heart failure) ([place]) 05/01/2013  . Physical deconditioning 03/25/2013  . Hyperkalemia 03/12/2013  . Hypothyroidism 03/06/2013  . Vitamin D deficiency 06/09/2012  . History of Cardiomyopathy, ischemic   . OSA (obstructive sleep apnea) 09/05/2010  . LEG PAIN, BILATERAL 12/28/2009  . Diabetes mellitus type 2 with retinopathy ([place]) 10/28/2009  . Paroxysmal atrial fibrillation ([place]) 06/21/2009  . History of non-Hodgkin's lymphoma 04/21/2009  . MURAL THROMBUS, LEFT VENTRICLE 03/07/2009  . LYMPHEDEMA 03/07/2009  . LBBB 08/26/2008  . History of CVA (cerebrovascular accident) 08/26/2008  . Backache 08/26/2008  . Automatic implantable cardioverter-defibrillator in situ 08/26/2008  . Acute cystitis without hematuria 05/17/2008  . Carotid stenosis 05/20/2007  . Hyperlipidemia 03/19/2007  . Essential hypertension 11/20/2006    Past Surgical History:  Procedure Laterality Date  . ABDOMINAL HYSTERECTOMY  1982  . CARDIAC CATHETERIZATION  06/03/01  . CARDIOVERSION N/A 07/30/2014   Procedure: CARDIOVERSION;  Surgeon: Jolaine Artist, MD;  Location: [facility] ENDOSCOPY;  Service: Cardiovascular;  Laterality: N/A;  . CARDIOVERSION N/A 11/25/2014   Procedure: CARDIOVERSION;  Surgeon: Evans Lance, MD;  Location: [facility];  Service: Cardiovascular;  Laterality: N/A;  . CATARACT EXTRACTION     left eye  . CHOLECYSTECTOMY  1970  . CORONARY ARTERY BYPASS GRAFT  2003   CABG X4  . INSERT / REPLACE / REMOVE PACEMAKER  2007; 2012   w/AICD  . IR KYPHO LUMBAR INC FX REDUCE BONE BX UNI/BIL CANNULATION INC/IMAGING  09/11/2016  . REFRACTIVE SURGERY     right eye  . TEE  WITHOUT CARDIOVERSION N/A 12/16/2015   Procedure: TRANSESOPHAGEAL ECHOCARDIOGRAM (TEE);  Surgeon: Jolaine Artist, MD;  Location: [facility] ENDOSCOPY;  Service: Cardiovascular;  Laterality: N/A;  . TUBAL LIGATION  1972    OB History    No data available       Home Medications    Prior to Admission medications   Medication Sig Start Date End Date Taking? Authorizing Provider  acetaminophen (TYLENOL) 500 MG tablet Take 1,000 mg by mouth 2 (two) times daily as needed for mild pain.     [provider]  albuterol (PROVENTIL HFA;VENTOLIN HFA) 108 (90 BASE) MCG/ACT inhaler Inhale 2 puffs into the lungs every 6 (six) hours as needed for wheezing or shortness of breath. 12/26/12   Mosie Lukes, MD  amiodarone (PACERONE) 200 MG tablet Take 200 mg by mouth daily.    [provider]  carvedilol (COREG) 25 MG tablet Take 1 tablet (25 mg total) by mouth 2 (two) times daily. 07/12/16   Bensimhon, Shaune Pascal, MD  dicyclomine (BENTYL) 20 MG tablet Take 1 tablet (20 mg  total) by mouth 2 (two) times daily as needed for spasms. 09/28/16   Julianne Rice, MD  diphenoxylate-atropine (LOMOTIL) 2.5-0.025 MG tablet Take 1 tablet by mouth 4 (four) times daily as needed for diarrhea or loose stools. 09/28/16   Julianne Rice, MD  escitalopram (LEXAPRO) 10 MG tablet Take 10 mg by mouth daily.    [provider]  fluticasone (FLONASE) 50 MCG/ACT nasal spray Place 2 sprays into both nostrils daily as needed for allergies or rhinitis. As needed for nasal stuffiness. Patient taking differently: Place 2 sprays into both nostrils daily as needed for allergies or rhinitis.  01/13/14   Mosie Lukes, MD  gabapentin (NEURONTIN) 300 MG capsule Take 300 mg by mouth at bedtime.    [provider]  glucose blood (FREESTYLE LITE) test strip DX: 250.60  Check sugars bid and as needed Patient taking differently: 1 each by Other route See admin instructions. Check blood sugar twice daily 05/22/13    Mosie Lukes, MD  insulin NPH (HUMULIN N,NOVOLIN N) 100 UNIT/ML injection Inject 7-40 Units into the skin See admin instructions. 40 units in the morning before breakfast then 7 units in the evening before supper/evening meal    [provider]  insulin regular (HUMULIN R) 250 units/2.93mL (100 units/mL) injection Inject 5 Units into the skin daily before breakfast.    [provider]  levothyroxine (SYNTHROID, LEVOTHROID) 25 MCG tablet Take 25 mcg by mouth daily before breakfast.    [provider]  LORazepam (ATIVAN) 0.5 MG tablet Take 1 tablet (0.5 mg total) by mouth at bedtime. 04/19/16   Mosie Lukes, MD  OXYGEN Inhale 3 L into the lungs continuous.     [provider]  torsemide (DEMADEX) 20 MG tablet Take 20-40 mg by mouth See admin instructions. 40 mg in the morning on Sun/Fri/Sat and 20 mg on Mon/Tues/Wed/Thurs    [provider]  traMADol (ULTRAM) 50 MG tablet Take 1 tablet (50 mg total) by mouth 3 (three) times daily as needed for severe pain. 09/28/16   Julianne Rice, MD  warfarin (COUMADIN) 5 MG tablet TAKE AS DIRECTED BY ANTICOAGULATION CLINIC Patient taking differently: Take 5 mg by mouth in the morning on Sun/Tues/Wed/Fr/Sat and 2.5 mg on Mon/Thurs 04/10/16   Evans Lance, MD    Family History Family History  Problem Relation Age of Onset  . Kidney cancer Mother        kidney and female repo - died @ 68  . Asthma Mother   . Cancer Mother        gyn and renal  . Heart disease Mother        CAD  . Heart disease Father        died @ 49  . Stroke Father   . Diabetes Father   . Hypertension Father   . Hyperlipidemia Father   . Heart attack Father   . Hypertension Son   . Kidney cancer Maternal Uncle   . Cancer Maternal Uncle   . Cirrhosis Maternal Grandmother        non alcohol  . Cancer Maternal Grandfather   . Kidney cancer Maternal Grandfather   . Hypertension Maternal Aunt     Social History Social History    Substance Use Topics  . Smoking status: Never Smoker  . Smokeless tobacco: Never Used  . Alcohol use No     Allergies   Sulfonamide derivatives   Review of Systems Review of Systems  Constitutional:  Negative for chills, fatigue and fever.  Respiratory: Negative for cough and shortness of breath.   Cardiovascular: Negative for chest pain.  Gastrointestinal: Positive for abdominal pain and diarrhea. Negative for blood in stool, constipation, nausea and vomiting.  Genitourinary: Negative for dysuria, flank pain, frequency and pelvic pain.  Musculoskeletal: Negative for back pain, joint swelling, myalgias, neck pain and neck stiffness.  Skin: Negative for rash and wound.  Neurological: Negative for dizziness, weakness and numbness.  All other systems reviewed and are negative.    Physical Exam Updated Vital Signs BP (!) 153/75   Pulse 70   Temp 97.8 F (36.6 C) (Oral)   Resp (!) 22   SpO2 98%   Physical Exam  Constitutional: She is oriented to person, place, and time. She appears well-developed and well-nourished. No distress.  HENT:  Head: Normocephalic and atraumatic.  Mouth/Throat: Oropharynx is clear and moist.  Eyes: EOM are normal. Pupils are equal, round, and reactive to light.  Neck: Normal range of motion. Neck supple.  Cardiovascular: Normal rate and regular rhythm.  Exam reveals no gallop and no friction rub.   No murmur heard. Pulmonary/Chest: Effort normal and breath sounds normal.  Abdominal: Soft. Bowel sounds are normal. There is tenderness (very mild diffuse abdominal tenderness without focality.). There is no rebound and no guarding.  No rebound or guarding. Normal bowel sounds.  Musculoskeletal: Normal range of motion. She exhibits no edema or tenderness.  No CVA tenderness. No midline thoracic or lumbar tenderness.  Neurological: She is alert and oriented to person, place, and time.  Moving all extremities without focal deficit.  Skin: Skin is warm  and dry. Capillary refill takes less than 2 seconds. No rash noted. No erythema.  Psychiatric: She has a normal mood and affect. Her behavior is normal.  Nursing note and vitals reviewed.    ED Treatments / Results  Labs (all labs ordered are listed, but only abnormal results are displayed) Labs Reviewed  LIPASE, BLOOD - Abnormal; Notable for the following:       Result Value   Lipase 123 (*)    All other components within normal limits  COMPREHENSIVE METABOLIC PANEL - Abnormal; Notable for the following:    Glucose, Bld 173 (*)    Creatinine, Ser 2.19 (*)    Total Protein 6.0 (*)    Albumin 3.0 (*)    Total Bilirubin 1.3 (*)    GFR calc non Af Amer 21 (*)    GFR calc Af Amer 24 (*)    All other components within normal limits  CBC - Abnormal; Notable for the following:    WBC 3.4 (*)    RBC 3.49 (*)    Hemoglobin 10.8 (*)    HCT 33.5 (*)    RDW 17.1 (*)    Platelets 76 (*)    All other components within normal limits  PROTIME-INR - Abnormal; Notable for the following:    Prothrombin Time 24.1 (*)    All other components within normal limits    EKG  EKG Interpretation None       Radiology Ct Abdomen Pelvis Wo Contrast  Result Date: 09/28/2016 CLINICAL DATA:  Diarrhea over the last week. Bilateral lower quadrant pain. EXAM: CT ABDOMEN AND PELVIS WITHOUT CONTRAST TECHNIQUE: Multidetector CT imaging of the abdomen and pelvis was performed following the standard protocol without IV contrast. COMPARISON:  09/04/2016. FINDINGS: Lower chest: Small pleural effusions. Dependent pulmonary atelectasis. Hepatobiliary: No liver abnormality seen without contrast. Previous cholecystectomy. Pancreas:  Normal Spleen: Calcified granuloma is.  No significant finding. Adrenals/Urinary Tract: Adrenal glands are normal. Kidneys show chronic atrophy/ cortical volume loss. No sign of obstruction, stone disease or mass lesion. 1 cm cyst at the lower pole on the left. Bladder appears unremarkable.  Stomach/Bowel: Moderate amount of ascites, largely collected around the liver and in the pelvis. There is diverticulosis, possibly with mild diverticulitis in the sigmoid region. This is not definite as there is some adjacent ascites. No small bowel abnormality is seen. Vascular/Lymphatic: Aortic atherosclerosis. No aneurysm. Diffuse vascular calcification typical of chronic diabetes. IVC is normal. No retroperitoneal adenopathy. Reproductive: Previous hysterectomy.  No pelvic mass. Other: No free air. Musculoskeletal: Chronic spinal degenerative changes. Previously augmented fracture of L2. IMPRESSION: Small to moderate amount of ascites, freely distributed. No free air. Diverticulosis of the sigmoid colon. Cannot rule out mild diverticulitis. No advanced diverticulitis. Aortic atherosclerosis. Diffuse vascular calcification typical of diabetes. Chronic renal atrophy. Small pleural effusions with mild dependent atelectasis. Electronically Signed   By: Nelson Chimes M.D.   On: 09/28/2016 17:10    Procedures Procedures (including critical care time)  Medications Ordered in ED Medications  sodium chloride 0.9 % bolus 500 mL (500 mLs Intravenous New Bag/Given 09/28/16 1604)  morphine 4 MG/ML injection 2 mg (2 mg Intravenous Given 09/28/16 1607)  ondansetron (ZOFRAN) injection 4 mg (4 mg Intravenous Given 09/28/16 1605)     Initial Impression / Assessment and Plan / ED Course  I have reviewed the triage vital signs and the nursing notes.  Pertinent labs & imaging results that were available during my care of the patient were reviewed by me and considered in my medical decision making (see chart for details).     Patient with chronic pancytopenia which is unchanged. Chronic renal insufficiency which is unchanged. Chronically elevated lipase. CT with diverticulosis and "cannot ruled out mild diverticulitis" of the sigmoid colon. Repeat exam with palpation in the left lower quadrant without any tenderness.  Patient has no epigastric tenderness. Patient states she is feeling much better. Unable to produce stool in the emergency department to send for C. difficile. Patient is advised to take sample to her primary physician's office. We'll hold on treating with antibiotics given the patient is asymptomatic. Changed since the need to return immediately for any worsening of her pain, fever, blood in her stool, persistent vomiting or for any concerns.  Final Clinical Impressions(s) / ED Diagnoses   Final diagnoses:  Diarrhea, unspecified type  Generalized abdominal pain    New Prescriptions New Prescriptions   DICYCLOMINE (BENTYL) 20 MG TABLET    Take 1 tablet (20 mg total) by mouth 2 (two) times daily as needed for spasms.   DIPHENOXYLATE-ATROPINE (LOMOTIL) 2.5-0.025 MG TABLET    Take 1 tablet by mouth 4 (four) times daily as needed for diarrhea or loose stools.   TRAMADOL (ULTRAM) 50 MG TABLET    Take 1 tablet (50 mg total) by mouth 3 (three) times daily as needed for severe pain.     Julianne Rice, MD 09/28/16 [phone]

## 2016-09-28 NOTE — Discharge Instructions (Signed)
Take a stool sample to primary MD for evaluation for C. difficile. Return immediately to the emergency department for worsening abdominal pain, persistent vomiting, fever, blood in the stool or for any concerns.

## 2016-10-01 ENCOUNTER — Telehealth: Payer: Self-pay | Admitting: *Deleted

## 2016-10-01 ENCOUNTER — Ambulatory Visit (INDEPENDENT_AMBULATORY_CARE_PROVIDER_SITE_OTHER): Payer: Medicare Other | Admitting: Internal Medicine

## 2016-10-01 DIAGNOSIS — I130 Hypertensive heart and chronic kidney disease with heart failure and stage 1 through stage 4 chronic kidney disease, or unspecified chronic kidney disease: Secondary | ICD-10-CM | POA: Diagnosis not present

## 2016-10-01 DIAGNOSIS — I5042 Chronic combined systolic (congestive) and diastolic (congestive) heart failure: Secondary | ICD-10-CM | POA: Diagnosis not present

## 2016-10-01 DIAGNOSIS — Z8673 Personal history of transient ischemic attack (TIA), and cerebral infarction without residual deficits: Secondary | ICD-10-CM

## 2016-10-01 DIAGNOSIS — E1122 Type 2 diabetes mellitus with diabetic chronic kidney disease: Secondary | ICD-10-CM | POA: Diagnosis not present

## 2016-10-01 DIAGNOSIS — S22000D Wedge compression fracture of unspecified thoracic vertebra, subsequent encounter for fracture with routine healing: Secondary | ICD-10-CM | POA: Diagnosis not present

## 2016-10-01 DIAGNOSIS — I480 Paroxysmal atrial fibrillation: Secondary | ICD-10-CM

## 2016-10-01 DIAGNOSIS — I2510 Atherosclerotic heart disease of native coronary artery without angina pectoris: Secondary | ICD-10-CM | POA: Diagnosis not present

## 2016-10-01 DIAGNOSIS — S32020D Wedge compression fracture of second lumbar vertebra, subsequent encounter for fracture with routine healing: Secondary | ICD-10-CM | POA: Diagnosis not present

## 2016-10-01 DIAGNOSIS — Z5181 Encounter for therapeutic drug level monitoring: Secondary | ICD-10-CM

## 2016-10-01 LAB — POCT INR: INR: 2

## 2016-10-01 NOTE — Telephone Encounter (Signed)
Stool sample brought in today does not appear to be diarrhea or loose watery type. Sample consist of two or three small formed pellet size type stool that may not be enough or be able to be processed due lab requirements.

## 2016-10-01 NOTE — Telephone Encounter (Signed)
I did speak with Destiny [name] and she does understand and will come back for stool kits if loose stools return. KMP

## 2016-10-01 NOTE — Telephone Encounter (Signed)
I sent a note over the weekend regarding tests I wanted to have ordered if she was still having diarrhea. Stool for Cdiff, stool for culture, stool for O and P and stool for WBC. She was in hospital over the weekend with diarrhea

## 2016-10-01 NOTE — Telephone Encounter (Signed)
I agree if it is not loose they will not run the sample. Please let patient know they will not run it if it is not runny. She should let us know if it returns so she can come in for sample cups

## 2016-10-01 NOTE — Telephone Encounter (Signed)
Pt brought in a sterile container with stool in it.. No lab orders currently in chart, please advise.

## 2016-10-02 DIAGNOSIS — I130 Hypertensive heart and chronic kidney disease with heart failure and stage 1 through stage 4 chronic kidney disease, or unspecified chronic kidney disease: Secondary | ICD-10-CM | POA: Diagnosis not present

## 2016-10-02 DIAGNOSIS — I2510 Atherosclerotic heart disease of native coronary artery without angina pectoris: Secondary | ICD-10-CM | POA: Diagnosis not present

## 2016-10-02 DIAGNOSIS — I5042 Chronic combined systolic (congestive) and diastolic (congestive) heart failure: Secondary | ICD-10-CM | POA: Diagnosis not present

## 2016-10-02 DIAGNOSIS — E1122 Type 2 diabetes mellitus with diabetic chronic kidney disease: Secondary | ICD-10-CM | POA: Diagnosis not present

## 2016-10-02 DIAGNOSIS — S32020D Wedge compression fracture of second lumbar vertebra, subsequent encounter for fracture with routine healing: Secondary | ICD-10-CM | POA: Diagnosis not present

## 2016-10-02 DIAGNOSIS — S22000D Wedge compression fracture of unspecified thoracic vertebra, subsequent encounter for fracture with routine healing: Secondary | ICD-10-CM | POA: Diagnosis not present

## 2016-10-03 DIAGNOSIS — I130 Hypertensive heart and chronic kidney disease with heart failure and stage 1 through stage 4 chronic kidney disease, or unspecified chronic kidney disease: Secondary | ICD-10-CM | POA: Diagnosis not present

## 2016-10-03 DIAGNOSIS — E1122 Type 2 diabetes mellitus with diabetic chronic kidney disease: Secondary | ICD-10-CM | POA: Diagnosis not present

## 2016-10-03 DIAGNOSIS — S32020D Wedge compression fracture of second lumbar vertebra, subsequent encounter for fracture with routine healing: Secondary | ICD-10-CM | POA: Diagnosis not present

## 2016-10-03 DIAGNOSIS — I2510 Atherosclerotic heart disease of native coronary artery without angina pectoris: Secondary | ICD-10-CM | POA: Diagnosis not present

## 2016-10-03 DIAGNOSIS — I5042 Chronic combined systolic (congestive) and diastolic (congestive) heart failure: Secondary | ICD-10-CM | POA: Diagnosis not present

## 2016-10-03 DIAGNOSIS — S22000D Wedge compression fracture of unspecified thoracic vertebra, subsequent encounter for fracture with routine healing: Secondary | ICD-10-CM | POA: Diagnosis not present

## 2016-10-04 DIAGNOSIS — I5042 Chronic combined systolic (congestive) and diastolic (congestive) heart failure: Secondary | ICD-10-CM | POA: Diagnosis not present

## 2016-10-04 DIAGNOSIS — I2510 Atherosclerotic heart disease of native coronary artery without angina pectoris: Secondary | ICD-10-CM | POA: Diagnosis not present

## 2016-10-04 DIAGNOSIS — I130 Hypertensive heart and chronic kidney disease with heart failure and stage 1 through stage 4 chronic kidney disease, or unspecified chronic kidney disease: Secondary | ICD-10-CM | POA: Diagnosis not present

## 2016-10-04 DIAGNOSIS — S22000D Wedge compression fracture of unspecified thoracic vertebra, subsequent encounter for fracture with routine healing: Secondary | ICD-10-CM | POA: Diagnosis not present

## 2016-10-04 DIAGNOSIS — E1122 Type 2 diabetes mellitus with diabetic chronic kidney disease: Secondary | ICD-10-CM | POA: Diagnosis not present

## 2016-10-04 DIAGNOSIS — S32020D Wedge compression fracture of second lumbar vertebra, subsequent encounter for fracture with routine healing: Secondary | ICD-10-CM | POA: Diagnosis not present

## 2016-10-09 ENCOUNTER — Telehealth: Payer: Self-pay

## 2016-10-09 ENCOUNTER — Ambulatory Visit (INDEPENDENT_AMBULATORY_CARE_PROVIDER_SITE_OTHER): Payer: Medicare Other | Admitting: *Deleted

## 2016-10-09 DIAGNOSIS — Z95810 Presence of automatic (implantable) cardiac defibrillator: Secondary | ICD-10-CM

## 2016-10-09 DIAGNOSIS — I5022 Chronic systolic (congestive) heart failure: Secondary | ICD-10-CM | POA: Diagnosis not present

## 2016-10-09 NOTE — Progress Notes (Signed)
EPIC Encounter for ICM Monitoring  Patient Name: Destiny [name] is a 76 y.o. female Date: 10/09/2016 Primary Care Physican: Mosie Lukes, MD Primary Cardiologist: Cecil Electrophysiologist: Lovena Le Nephrologist: [place] Dry Weight: Last known weight228lb  Bi-V Pacing: >99% Battery Longevity: 6.2 months      Attempted call to patient and unable to reach.  Left detailed message regarding transmission.  Transmission reviewed.    Thoracic impedance normal but was abnormal suggesting fluid accumulation from 08/31/2016 to 09/19/2016.  Prescribed and confirmed: Torsemide 20-40 mg by mouth as directed. 2 tablets daily on Friday, Saturday &Sunday, 1 tablet daily Monday through Thursday.  Labs: 09/05/2016 Creatinine 3.02, BUN 35, Potassium 4.2, Sodium 138, EGFR 14-17 09/04/2016 Creatinine 2.75, BUN 36, Potassium 3.7, Sodium 136, EGFR 16-19  08/27/2016 Creatinine 3.02, BUN 42, Potassium 4.7, Sodium 134  08/20/2016 Creatinine 3.1, BUN 44.6, Potassium 4.4, Sodium 139  07/30/2016 Creatinine 3.4, BUN 44, Potassium 4.2, Sodium 137 05/21/2016 Creatinine 4.3, BUN 58, Potassium 4.4, Sodium 134 04/27/2016 Creatinine 3.73, BUN 44, Potassium 4.8, Sodium 137, EGFR 12.62 04/23/2016 Creatinine 3.74, BUN 49, Potassium 4.8, Sodium 137, EGFR 12.59  04/20/2016 Creatinine 3.43, BUN 48, Potassium 4.7, Sodium 137, EGFR 13.91  04/19/2016 Creatinine 3.32, BUN 51, Potassium 4.6, Sodium 137, EGFR 14.44  01/09/2016 Creatinine 2.33, BUN 61, Potassium 4.2, Sodium 138  01/08/2016 Creatinine 2.42, BUN 62, Potassium 4.3, Sodium 139  01/07/2016 Creatinine 2.75, BUN 63, Potassium 4.7, Sodium 137  01/05/2016 Creatinine 2.62, BUN 65, Potassium 4.6, Sodium 136  01/04/2016 Creatinine 2.58, BUN 60, Potassium 4.6, Sodium 135  01/03/2016 Creatinine 2.47, BUN 60, Potassium 4.6, Sodium 135 01/02/2016 Creatinine 2.83, BUN 55, Potassium 4.7, Sodium 140  01/01/2016  Creatinine 2.91, BUN 56, Potassium 4.6, Sodium 137  12/17/2015 Creatinine 2.85, BUN 64, Potassium 5.0, Sodium 139   Recommendations:Left voice mail with ICM number and encouraged to call for fluid symptoms.  Follow-up plan: ICM clinic phone appointment on 11/09/2016.    Copy of ICM check sent to device physician.   3 month ICM trend: 10/09/2016   1 Year ICM trend:      [name], RN 10/09/2016 3:01 PM

## 2016-10-09 NOTE — Telephone Encounter (Signed)
Remote ICM transmission received.  Attempted patient call and left detailed message regarding transmission and next ICM scheduled for 11/09/2016.  Advised to return call for any fluid symptoms or questions.    

## 2016-10-10 ENCOUNTER — Ambulatory Visit (INDEPENDENT_AMBULATORY_CARE_PROVIDER_SITE_OTHER): Payer: Medicare Other | Admitting: Interventional Cardiology

## 2016-10-10 DIAGNOSIS — D509 Iron deficiency anemia, unspecified: Secondary | ICD-10-CM | POA: Diagnosis not present

## 2016-10-10 DIAGNOSIS — E1122 Type 2 diabetes mellitus with diabetic chronic kidney disease: Secondary | ICD-10-CM | POA: Diagnosis not present

## 2016-10-10 DIAGNOSIS — N181 Chronic kidney disease, stage 1: Secondary | ICD-10-CM | POA: Diagnosis not present

## 2016-10-10 DIAGNOSIS — Z8673 Personal history of transient ischemic attack (TIA), and cerebral infarction without residual deficits: Secondary | ICD-10-CM

## 2016-10-10 DIAGNOSIS — I2510 Atherosclerotic heart disease of native coronary artery without angina pectoris: Secondary | ICD-10-CM | POA: Diagnosis not present

## 2016-10-10 DIAGNOSIS — S32020D Wedge compression fracture of second lumbar vertebra, subsequent encounter for fracture with routine healing: Secondary | ICD-10-CM | POA: Diagnosis not present

## 2016-10-10 DIAGNOSIS — I130 Hypertensive heart and chronic kidney disease with heart failure and stage 1 through stage 4 chronic kidney disease, or unspecified chronic kidney disease: Secondary | ICD-10-CM | POA: Diagnosis not present

## 2016-10-10 DIAGNOSIS — Z5181 Encounter for therapeutic drug level monitoring: Secondary | ICD-10-CM

## 2016-10-10 DIAGNOSIS — I5042 Chronic combined systolic (congestive) and diastolic (congestive) heart failure: Secondary | ICD-10-CM | POA: Diagnosis not present

## 2016-10-10 DIAGNOSIS — I480 Paroxysmal atrial fibrillation: Secondary | ICD-10-CM

## 2016-10-10 DIAGNOSIS — S22000D Wedge compression fracture of unspecified thoracic vertebra, subsequent encounter for fracture with routine healing: Secondary | ICD-10-CM | POA: Diagnosis not present

## 2016-10-10 LAB — POCT INR: INR: 2.4

## 2016-10-12 LAB — CUP PACEART REMOTE DEVICE CHECK
Battery Remaining Longevity: 6 mo
Brady Statistic AP VP Percent: 99 %
Brady Statistic AS VP Percent: 1 %
Brady Statistic AS VS Percent: 1 %
Brady Statistic RA Percent Paced: 99 %
HighPow Impedance: 43 Ohm
Implantable Lead Implant Date: 20070322
Implantable Lead Location: 753859
Implantable Lead Location: 753860
Lead Channel Impedance Value: 310 Ohm
Lead Channel Impedance Value: 350 Ohm
Lead Channel Pacing Threshold Amplitude: 0.75 V
Lead Channel Pacing Threshold Amplitude: 0.875 V
Lead Channel Pacing Threshold Amplitude: 1 V
Lead Channel Pacing Threshold Pulse Width: 0.6 ms
Lead Channel Sensing Intrinsic Amplitude: 7.6 mV
Lead Channel Setting Pacing Amplitude: 2 V
Lead Channel Setting Pacing Pulse Width: 0.5 ms
Lead Channel Setting Pacing Pulse Width: 0.6 ms
Lead Channel Setting Sensing Sensitivity: 0.5 mV
MDC IDC LEAD IMPLANT DT: 20070322
MDC IDC LEAD IMPLANT DT: 20070322
MDC IDC LEAD LOCATION: 753858
MDC IDC MSMT BATTERY REMAINING PERCENTAGE: 9 %
MDC IDC MSMT BATTERY VOLTAGE: 2.66 V
MDC IDC MSMT LEADCHNL RA IMPEDANCE VALUE: 290 Ohm
MDC IDC MSMT LEADCHNL RA PACING THRESHOLD PULSEWIDTH: 0.5 ms
MDC IDC MSMT LEADCHNL RA SENSING INTR AMPL: 3.6 mV
MDC IDC MSMT LEADCHNL RV PACING THRESHOLD PULSEWIDTH: 0.5 ms
MDC IDC PG IMPLANT DT: 20120426
MDC IDC PG SERIAL: 631685
MDC IDC SESS DTM: 20180529092558
MDC IDC SET LEADCHNL LV PACING AMPLITUDE: 2 V
MDC IDC SET LEADCHNL RV PACING AMPLITUDE: 2.5 V
MDC IDC STAT BRADY AP VS PERCENT: 1 %

## 2016-10-14 ENCOUNTER — Other Ambulatory Visit: Payer: Self-pay | Admitting: Internal Medicine

## 2016-10-15 ENCOUNTER — Telehealth: Payer: Self-pay | Admitting: Family Medicine

## 2016-10-15 ENCOUNTER — Ambulatory Visit (HOSPITAL_BASED_OUTPATIENT_CLINIC_OR_DEPARTMENT_OTHER): Payer: Medicare Other | Admitting: Nurse Practitioner

## 2016-10-15 ENCOUNTER — Other Ambulatory Visit (HOSPITAL_BASED_OUTPATIENT_CLINIC_OR_DEPARTMENT_OTHER): Payer: Medicare Other

## 2016-10-15 VITALS — BP 112/44 | HR 70 | Temp 98.2°F | Resp 18 | Ht 64.0 in

## 2016-10-15 DIAGNOSIS — I10 Essential (primary) hypertension: Secondary | ICD-10-CM

## 2016-10-15 DIAGNOSIS — M545 Low back pain: Secondary | ICD-10-CM

## 2016-10-15 DIAGNOSIS — E119 Type 2 diabetes mellitus without complications: Secondary | ICD-10-CM

## 2016-10-15 DIAGNOSIS — G8929 Other chronic pain: Secondary | ICD-10-CM

## 2016-10-15 DIAGNOSIS — N183 Chronic kidney disease, stage 3 (moderate): Secondary | ICD-10-CM

## 2016-10-15 DIAGNOSIS — N189 Chronic kidney disease, unspecified: Secondary | ICD-10-CM | POA: Diagnosis not present

## 2016-10-15 DIAGNOSIS — Z8572 Personal history of non-Hodgkin lymphomas: Secondary | ICD-10-CM

## 2016-10-15 LAB — COMPREHENSIVE METABOLIC PANEL
ALBUMIN: 2.7 g/dL — AB (ref 3.5–5.0)
ALK PHOS: 112 U/L (ref 40–150)
ALT: 14 U/L (ref 0–55)
ANION GAP: 8 meq/L (ref 3–11)
AST: 30 U/L (ref 5–34)
BUN: 31.2 mg/dL — AB (ref 7.0–26.0)
CALCIUM: 8.2 mg/dL — AB (ref 8.4–10.4)
CO2: 27 mEq/L (ref 22–29)
Chloride: 100 mEq/L (ref 98–109)
Creatinine: 2.4 mg/dL — ABNORMAL HIGH (ref 0.6–1.1)
EGFR: 20 mL/min/{1.73_m2} — AB (ref >90)
Glucose: 165 mg/dl — ABNORMAL HIGH (ref 70–140)
POTASSIUM: 4.1 meq/L (ref 3.5–5.1)
Sodium: 135 mEq/L — ABNORMAL LOW (ref 136–145)
Total Bilirubin: 0.64 mg/dL (ref 0.20–1.20)
Total Protein: 5.4 g/dL — ABNORMAL LOW (ref 6.4–8.3)

## 2016-10-15 LAB — LACTATE DEHYDROGENASE: LDH: 299 U/L — AB (ref 125–245)

## 2016-10-15 NOTE — Telephone Encounter (Signed)
Relation to IR:CVEL Call back [place]: [place], [place] [phone] (Phone) [phone] (Fax)     Reason for call:  Patient lvm requesting a refill LORazepam (ATIVAN) 0.5 MG tablet

## 2016-10-15 NOTE — Progress Notes (Addendum)
[facility] OFFICE PROGRESS NOTE   Diagnosis:  Non-Hodgkin's lymphoma  INTERVAL HISTORY:   Destiny [name] returns as scheduled. She underwent a kyphoplasty procedure 09/12/2016. She noted fairly immediate improvement in back pain. She continues to be unsteady and notes that when she loses her balance she typically falls backward. Last week she had 2 episodes of a low-grade fever. None since. No associated symptoms. She denies any night sweats.  Objective:  Vital signs in last 24 hours:  Blood pressure (!) 112/44, pulse 70, temperature 98.2 F (36.8 C), temperature source Oral, resp. rate 18, height 5\' 4"  (1.626 m), SpO2 100 %.    HEENT: No thrush or ulcers. Lymphatics: No palpable cervical, supra clavicular or axillary lymph nodes. Unable to examine the inguinal regions as she was unable to maneuver safely onto the exam table. Resp: Lungs clear bilaterally. Cardio: Regular rate and rhythm. GI: Abdomen soft and nontender. Vascular: No leg edema.    Lab Results:  Lab Results  Component Value Date   WBC 3.4 (L) 09/28/2016   HGB 10.8 (L) 09/28/2016   HCT 33.5 (L) 09/28/2016   MCV 96.0 09/28/2016   PLT 76 (L) 09/28/2016   NEUTROABS 2.2 09/11/2016    Imaging:  No results found.  Medications: I have reviewed the patient's current medications.  Assessment/Plan: 1. Non-Hodgkin's lymphoma, high-grade B-cell lymphoma, involving left inguinal mass, status post core biopsy 03/21/2009. Staging PET scan on 03/16/2009 showed a left inguinal nodal conglomerate measuring 7 x 6.3 cm with intense hypermetabolic activity with additional hypermetabolic tumor and mass-like swelling within the muscle groups extending to the left knee with a permeative appearance of the distal femoral metaphysis and femoral condyles. She completed 6 cycles of CHOP (Adriamycin was deleted and etoposide substituted) and rituximab with marked clinical and radiographic improvement. 2. Hospitalization  11/25 through 04/10/2009 with acute exacerbation of diastolic congestive heart failure, likely secondary to Lasix being held and intravenous fluids given with cycle 1 of chemotherapy. 3. Ischemic cardiomyopathy status post implantation of a biventricular ICD 08/02/2005 with improvement in the ejection fraction. 4. Insulin-dependent diabetes. 5. Hypertension. 6. Hypercholesterolemia.  7. Hypertriglyceridemia. 8. Sleep apnea on CPAP and home oxygen. 9. History of peripheral neuropathy, unchanged following chemotherapy. 10. Chronic low back pain followed by Dr. Nelva Bush. 11. Hospitalization September 2013 with acute CHF and cellulitis. 12. Admission with heart failure/ respiratory failure October 2014 13. Recent hypercalcemia, unexplained etiology. Per Dr. Standley Dakins note 07/18/2016 calcium level peaked at 13. Vitamin D and calcium supplements discontinued. Calcium slowly improved to 10.5. Appropriately low iPTH, negative SPEP, normal chest x-ray. She has been referred for a mammogram. Pendingvitamin D levels and PTH related peptide. 14. Chronic kidney disease followed by Dr. Marval Regal 15. Fall 08/07/2016-L2 compression fracture noted on CT; status post kyphoplasty 09/12/2016. Bone biopsy showed benign bone fragments. Bone marrow with trilineage hematopoiesis. Scattered noncaseating granulomata. AFB and PAS stains negative for microorganisms.   Disposition: Destiny [name] appears unchanged. She remains in clinical remission from non-Hodgkin's lymphoma.   The bone biopsy obtained at the time of the recent kyphoplasty showed noncaseating granulomata. Question sarcoidosis. Question if the hypercalcemia could be related to this. The calcium level is normal on today's labs.  We did not schedule formal follow-up in our office. We will be happy to see her in the future as needed.  Patient seen with Dr. Benay Spice.    Ned Card ANP/GNP-BC   10/15/2016  11:37 AM  This was a shared visit with  Ned Card. Destiny [name]  is in clinical remission from non-Hodgkin's lymphoma. A bone biopsy obtained at the time of a kyphoplasty revealed noncaseating granulomata. The hypercalcemia noted over the past few months may be related to sarcoidosis. She will continue follow-up of the renal failure and calcium with nephrology.  I am available to see her in the future as needed.  Julieanne Manson, M.D.

## 2016-10-16 ENCOUNTER — Encounter: Payer: Self-pay | Admitting: Family Medicine

## 2016-10-16 MED ORDER — LORAZEPAM 0.5 MG PO TABS: 0.5000 mg | ORAL_TABLET | Freq: Every day | ORAL | 1 refills | Status: DC

## 2016-10-16 NOTE — Telephone Encounter (Signed)
Requesting:   lorazepam Contract     03/08/2014 UDS    none Last OV     09/18/2016----10/23/2016 Last Refill   #90 with 1 refill on 04/19/2016  Please Advise

## 2016-10-16 NOTE — Telephone Encounter (Signed)
Printed prescription/and contract Put at the front ready for pickup   Patient will pickup/complete all at her [place] on 10/23/2016 as difficult for her to get in.

## 2016-10-16 NOTE — Telephone Encounter (Signed)
OK to refill but needs UDS and contract 

## 2016-10-18 DIAGNOSIS — S32020D Wedge compression fracture of second lumbar vertebra, subsequent encounter for fracture with routine healing: Secondary | ICD-10-CM | POA: Diagnosis not present

## 2016-10-18 DIAGNOSIS — I2510 Atherosclerotic heart disease of native coronary artery without angina pectoris: Secondary | ICD-10-CM | POA: Diagnosis not present

## 2016-10-18 DIAGNOSIS — S22000D Wedge compression fracture of unspecified thoracic vertebra, subsequent encounter for fracture with routine healing: Secondary | ICD-10-CM | POA: Diagnosis not present

## 2016-10-18 DIAGNOSIS — I5042 Chronic combined systolic (congestive) and diastolic (congestive) heart failure: Secondary | ICD-10-CM | POA: Diagnosis not present

## 2016-10-18 DIAGNOSIS — E1122 Type 2 diabetes mellitus with diabetic chronic kidney disease: Secondary | ICD-10-CM | POA: Diagnosis not present

## 2016-10-18 DIAGNOSIS — I130 Hypertensive heart and chronic kidney disease with heart failure and stage 1 through stage 4 chronic kidney disease, or unspecified chronic kidney disease: Secondary | ICD-10-CM | POA: Diagnosis not present

## 2016-10-19 ENCOUNTER — Telehealth: Payer: Self-pay | Admitting: Oncology

## 2016-10-19 ENCOUNTER — Encounter: Payer: Self-pay | Admitting: Cardiology

## 2016-10-19 NOTE — Telephone Encounter (Signed)
No los per 10/15/16 visit.

## 2016-10-23 ENCOUNTER — Ambulatory Visit (INDEPENDENT_AMBULATORY_CARE_PROVIDER_SITE_OTHER): Payer: Medicare Other | Admitting: Family Medicine

## 2016-10-23 ENCOUNTER — Encounter: Payer: Self-pay | Admitting: Family Medicine

## 2016-10-23 DIAGNOSIS — E11319 Type 2 diabetes mellitus with unspecified diabetic retinopathy without macular edema: Secondary | ICD-10-CM | POA: Diagnosis not present

## 2016-10-23 DIAGNOSIS — Z8572 Personal history of non-Hodgkin lymphomas: Secondary | ICD-10-CM

## 2016-10-23 DIAGNOSIS — E039 Hypothyroidism, unspecified: Secondary | ICD-10-CM

## 2016-10-23 DIAGNOSIS — N184 Chronic kidney disease, stage 4 (severe): Secondary | ICD-10-CM

## 2016-10-23 DIAGNOSIS — Z794 Long term (current) use of insulin: Secondary | ICD-10-CM

## 2016-10-23 DIAGNOSIS — I255 Ischemic cardiomyopathy: Secondary | ICD-10-CM | POA: Diagnosis not present

## 2016-10-23 DIAGNOSIS — E662 Morbid (severe) obesity with alveolar hypoventilation: Secondary | ICD-10-CM

## 2016-10-23 NOTE — Progress Notes (Signed)
Subjective:  I acted as a Education administrator for Dr. Charlett Blake. [place], [place]  [name], female    DOB: Apr 30, 1941, 76 y.o.   MRN: [id number]  Chief Complaint  Patient presents with  . Follow-up    HPI  Patient is in today for a follow up after her back surgery. Patietn states she has been doing well since the surgery. She reports her back pain is essentially resolved status post her kyphoplasty. She continues to have hip pain most notably on the right but that is stable and long-term. Not worsening. No acute complaints or recent febrile illness. No recent hospitalization. She continues to do well with noninvasive respiratory support daily at bedtime. She is sleeping better. Denies CP/palp/SOB/HA/congestion/fevers/GI or GU c/o. Taking meds as prescribed  Patient Care Team: Mosie Lukes, MD as PCP - General (Family Medicine) Donato Heinz, MD as Consulting Physician (Nephrology) Evans Lance, MD as Consulting Physician (Cardiology) Larey Dresser, MD as Consulting Physician (Cardiology) Jacelyn Pi, MD as Consulting Physician (Endocrinology) Rigoberto Noel, MD as Consulting Physician (Pulmonary Disease)   Past Medical History:  Diagnosis Date  . AICD (automatic cardioverter/defibrillator) present   . Anemia, unspecified 12/13/2013  . Back pain, chronic    "just when I walk; mass on 3rd and 4th vertebrae right lower back"  . Biventricular implantable cardiac defibrillator in situ 2007, 2012   a. 2007;  b. 2012 Gen change: SJM 3231-40 Uni BiV ICD, ser # [id number].  Marland Kitchen CAD (coronary artery disease)    a. 05/2001 CABG x4: LIMA->LAD, VG->D1, VG->D2, VG->RCA.  . Cardiomyopathy, ischemic    a. 2012 s/p SJM 3231-40 Uni BiV ICD, ser # [id number].  . Carotid stenosis    a. 10/19/2011 carotid duplex - Mild hard plaque bilaterally. Stable 40-59% bilateral ICA stenosis. Carotid US (04/2013):  Bilateral 40-59% ICA.  F/u 1 year  . Cerumen impaction 10/28/2015  . Chronic combined systolic  and diastolic CHF (congestive heart failure) (HCC)    a. EF as low as 25% in 2006;  b. EF 60-65% in 12/2011;  c. 02/2013 Echo: EF 35-40, mod mid-dist antsept HK, Gr 2 DD, mild LVH.  . CKD (chronic kidney disease), stage IV ([place])    hx/notes 09/04/2016  . COPD (chronic obstructive pulmonary disease) ([place])   . 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 Dyslipidemia   . Gout ~ 08/2011  . History of vertebral fracture 09/18/2016  . Hypercalcemia 09/26/2013  . Hypertension   . Hypothyroidism   . Hypoxemia requiring supplemental oxygen   . LBBB (left bundle branch block)   . Lumbar compression fracture ([place])    L2/notes 09/04/2016  . Morbid obesity with BMI of 45.0-49.9, adult (HCC)    Ht. 5'4". BMI 47.2  . Mural thrombus of left ventricle    before 2003, while on Coumadin, No h/o CVA  . Non-Hodgkin's lymphoma of inguinal region ([place]) 02/2009   mass; left; B-type; Dr. Benay Spice, in remission  . On home oxygen therapy    "normally 2-3L; 24/7" (12/14/2015); "2L; 24/7" (09/04/2016)  . OSA on CPAP   . PAF (paroxysmal atrial fibrillation) (HCC)    on Coumadin  . Presence of permanent cardiac pacemaker   . 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 Urinary incontinence 06/25/2016  . Vertebral fracture, osteoporotic ([place]) 08/27/2016  . Vitamin D deficiency 06/09/2012  historical    Past Surgical History:  Procedure Laterality Date  . ABDOMINAL HYSTERECTOMY  1982  . CARDIAC CATHETERIZATION  06/03/01  . CARDIOVERSION N/A 07/30/2014   Procedure: CARDIOVERSION;  Surgeon: Jolaine Artist, MD;  Location: [facility] ENDOSCOPY;  Service: Cardiovascular;  Laterality: N/A;  . CARDIOVERSION N/A 11/25/2014   Procedure: CARDIOVERSION;  Surgeon: Evans Lance, MD;  Location: [facility];  Service: Cardiovascular;  Laterality: N/A;  . CATARACT EXTRACTION     left eye  . CHOLECYSTECTOMY  1970    . CORONARY ARTERY BYPASS GRAFT  2003   CABG X4  . INSERT / REPLACE / REMOVE PACEMAKER  2007; 2012   w/AICD  . IR KYPHO LUMBAR INC FX REDUCE BONE BX UNI/BIL CANNULATION INC/IMAGING  09/11/2016  . REFRACTIVE SURGERY     right eye  . TEE WITHOUT CARDIOVERSION N/A 12/16/2015   Procedure: TRANSESOPHAGEAL ECHOCARDIOGRAM (TEE);  Surgeon: Jolaine Artist, MD;  Location: [facility] ENDOSCOPY;  Service: Cardiovascular;  Laterality: N/A;  . TUBAL LIGATION  1972    Family History  Problem Relation Age of Onset  . Kidney cancer Mother        kidney and female repo - died @ 8  . Asthma Mother   . Cancer Mother        gyn and renal  . Heart disease Mother        CAD  . Heart disease Father        died @ 53  . Stroke Father   . Diabetes Father   . Hypertension Father   . Hyperlipidemia Father   . Heart attack Father   . Hypertension Son   . Kidney cancer Maternal Uncle   . Cancer Maternal Uncle   . Cirrhosis Maternal Grandmother        non alcohol  . Cancer Maternal Grandfather   . Kidney cancer Maternal Grandfather   . Hypertension Maternal Aunt     Social History   Social History  . Marital status: Married    Spouse name: N/A  . Number of children: Y  . Years of education: N/A   Occupational History  . 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    Social History Main Topics  . Smoking status: Never Smoker  . Smokeless tobacco: Never Used  . Alcohol use No  . Drug use: No  . Sexual activity: Not Currently     Comment: lives with husband, no major dietary restrictions   Other Topics Concern  . Not on file   Social History Narrative   Lives in [place] with her husband.  She does not routinely exercise or adhere to any particular diet.      Outpatient Medications Prior to Visit  Medication Sig Dispense Refill  . acetaminophen (TYLENOL) 500 MG tablet Take 1,000 mg by mouth 2 (two) times daily as needed for mild pain.     Marland Kitchen albuterol (PROVENTIL HFA;VENTOLIN HFA) 108 (90  BASE) MCG/ACT inhaler Inhale 2 puffs into the lungs every 6 (six) hours as needed for wheezing or shortness of breath. 1 Inhaler 1  . amiodarone (PACERONE) 200 MG tablet Take 200 mg by mouth daily.    . carvedilol (COREG) 25 MG tablet Take 1 tablet (25 mg total) by mouth 2 (two) times daily. 360 tablet 3  . dicyclomine (BENTYL) 20 MG tablet Take 1 tablet (20 mg total) by mouth 2 (two) times daily as needed for spasms. 30 tablet 0  . diphenoxylate-atropine (  LOMOTIL) 2.5-0.025 MG tablet Take 1 tablet by mouth 4 (four) times daily as needed for diarrhea or loose stools. 30 tablet 0  . escitalopram (LEXAPRO) 10 MG tablet Take 10 mg by mouth daily.    . fluticasone (FLONASE) 50 MCG/ACT nasal spray Place 2 sprays into both nostrils daily as needed for allergies or rhinitis. As needed for nasal stuffiness. (Patient taking differently: Place 2 sprays into both nostrils daily as needed for allergies or rhinitis. ) 16 g 0  . gabapentin (NEURONTIN) 300 MG capsule Take 300 mg by mouth at bedtime.    Marland Kitchen glucose blood (FREESTYLE LITE) test strip DX: 250.60  Check sugars bid and as needed (Patient taking differently: 1 each by Other route See admin instructions. Check blood sugar twice daily) 100 each 2  . insulin NPH (HUMULIN N,NOVOLIN N) 100 UNIT/ML injection Inject 7-40 Units into the skin See admin instructions. 40 units in the morning before breakfast then 7 units in the evening before supper/evening meal    . insulin regular (HUMULIN R) 250 units/2.63mL (100 units/mL) injection Inject 5 Units into the skin daily before breakfast.    . levothyroxine (SYNTHROID, LEVOTHROID) 25 MCG tablet Take 25 mcg by mouth daily before breakfast.    . OXYGEN Inhale 2 L into the lungs continuous.     . torsemide (DEMADEX) 20 MG tablet Take 20-40 mg by mouth See admin instructions. 40 mg in the morning on Sun/Fri/Sat and 20 mg on Mon/Tues/Wed/Thurs    . warfarin (COUMADIN) 5 MG tablet TAKE AS DIRECTED BY ANTICOAGULATION CLINIC.  90 tablet 1  . LORazepam (ATIVAN) 0.5 MG tablet Take 1 tablet (0.5 mg total) by mouth at bedtime. 90 tablet 1  . traMADol (ULTRAM) 50 MG tablet Take 1 tablet (50 mg total) by mouth 3 (three) times daily as needed for severe pain. (Patient not taking: Reported on 10/15/2016) 10 tablet 0   No facility-administered medications prior to visit.     Allergies  Allergen Reactions  . Sulfonamide Derivatives Other (See Comments)    Reaction unknown; "childhood allergy/mother"    Review of Systems  Constitutional: Positive for malaise/fatigue. Negative for fever.  HENT: Negative for congestion.   Eyes: Negative for blurred vision.  Respiratory: Negative for shortness of breath.   Cardiovascular: Negative for chest pain, palpitations and leg swelling.  Gastrointestinal: Negative for abdominal pain, blood in stool and nausea.  Genitourinary: Negative for dysuria and frequency.  Musculoskeletal: Positive for joint pain. Negative for falls.  Skin: Negative for rash.  Neurological: Negative for dizziness, loss of consciousness and headaches.  Endo/Heme/Allergies: Negative for environmental allergies.  Psychiatric/Behavioral: Negative for depression. The patient is not nervous/anxious.        Objective:    Physical Exam  Constitutional: She is oriented to person, place, and time. She appears well-developed and well-nourished. No distress.  HENT:  Head: Normocephalic and atraumatic.  Nose: Nose normal.  Eyes: Right eye exhibits no discharge. Left eye exhibits no discharge.  Neck: Normal range of motion. Neck supple.  Cardiovascular: Normal rate and regular rhythm.   Murmur heard. Pulmonary/Chest: Effort normal and breath sounds normal.  Abdominal: Soft. Bowel sounds are normal. There is no tenderness.  Musculoskeletal: She exhibits no edema.  Neurological: She is alert and oriented to person, place, and time.  Skin: Skin is warm and dry.  Psychiatric: She has a normal mood and affect.    Nursing note and vitals reviewed.   BP (!) 114/37 (BP Location: Left Arm, Patient Position:  Sitting, Cuff Size: Normal)   Pulse 69   Temp 98.5 F (36.9 C) (Oral)   Resp 18   Wt 226 lb 3.2 oz (102.6 kg)   SpO2 98%   BMI 38.83 kg/m  Wt Readings from Last 3 Encounters:  10/23/16 226 lb 3.2 oz (102.6 kg)  09/18/16 227 lb 12.8 oz (103.3 kg)  09/10/16 228 lb (103.4 kg)   BP Readings from Last 3 Encounters:  10/23/16 (!) 114/37  10/15/16 (!) 112/44  09/28/16 115/73     Immunization History  Administered Date(s) Administered  . H1N1 05/17/2008  . Influenza Split 01/29/2012  . Influenza Whole 02/03/2007, 02/11/2009, 01/17/2010, 01/26/2013  . Influenza,inj,Quad PF,36+ Mos 01/25/2016  . Influenza-Unspecified 01/12/2014, 02/13/2015  . Pneumococcal Conjugate-13 03/03/2015  . Pneumococcal Polysaccharide-23 12/23/2007, 03/11/2013  . Td 09/29/2001, 01/28/2012  . Tdap 10/01/2011  . Zoster 08/19/2007    Health Maintenance  Topic Date Due  . MAMMOGRAM  01/11/2017 (Originally 01/03/2013)  . DEXA SCAN  01/11/2017 (Originally 09/20/2007)  . URINE MICROALBUMIN  06/25/2017 (Originally 03/27/2012)  . INFLUENZA VACCINE  12/12/2016  . HEMOGLOBIN A1C  02/26/2017  . FOOT EXAM  06/25/2017  . OPHTHALMOLOGY EXAM  09/18/2017  . COLONOSCOPY  04/02/2021  . TETANUS/TDAP  01/27/2022  . PNA vac Low Risk Adult  Completed    Lab Results  Component Value Date   WBC 3.4 (L) 09/28/2016   HGB 10.8 (L) 09/28/2016   HCT 33.5 (L) 09/28/2016   PLT 76 (L) 09/28/2016   GLUCOSE 165 (H) 10/15/2016   CHOL 154 09/05/2016   TRIG 226 (H) 09/05/2016   HDL 21 (L) 09/05/2016   LDLDIRECT 93.0 08/27/2016   LDLCALC 88 09/05/2016   ALT 14 10/15/2016   AST 30 10/15/2016   NA 135 (L) 10/15/2016   K 4.1 10/15/2016   CL 103 09/28/2016   CREATININE 2.4 (H) 10/15/2016   BUN 31.2 (H) 10/15/2016   CO2 27 10/15/2016   TSH 7.98 (H) 08/27/2016   INR 2.4 10/10/2016   HGBA1C 6.2 08/27/2016   MICROALBUR 77.0 (H)  03/28/2011    Lab Results  Component Value Date   TSH 7.98 (H) 08/27/2016   Lab Results  Component Value Date   WBC 3.4 (L) 09/28/2016   HGB 10.8 (L) 09/28/2016   HCT 33.5 (L) 09/28/2016   MCV 96.0 09/28/2016   PLT 76 (L) 09/28/2016   Lab Results  Component Value Date   NA 135 (L) 10/15/2016   K 4.1 10/15/2016   CHLORIDE 100 10/15/2016   CO2 27 10/15/2016   GLUCOSE 165 (H) 10/15/2016   BUN 31.2 (H) 10/15/2016   CREATININE 2.4 (H) 10/15/2016   BILITOT 0.64 10/15/2016   ALKPHOS 112 10/15/2016   AST 30 10/15/2016   ALT 14 10/15/2016   PROT 5.4 (L) 10/15/2016   ALBUMIN 2.7 (L) 10/15/2016   CALCIUM 8.2 (L) 10/15/2016   ANIONGAP 8 10/15/2016   EGFR 20 (L) 10/15/2016   GFR 16.09 (L) 08/27/2016   Lab Results  Component Value Date   CHOL 154 09/05/2016   Lab Results  Component Value Date   HDL 21 (L) 09/05/2016   Lab Results  Component Value Date   LDLCALC 88 09/05/2016   Lab Results  Component Value Date   TRIG 226 (H) 09/05/2016   Lab Results  Component Value Date   CHOLHDL 7.3 09/05/2016   Lab Results  Component Value Date   HGBA1C 6.2 08/27/2016         Assessment & Plan:  Problem List Items Addressed This Visit    History of non-Hodgkin's lymphoma    Has been released from oncology and is doing very well.       Diabetes mellitus type 2 with retinopathy (HCC)     minimize simple carbs. Increase exercise as tolerated. Continue current meds      Hypothyroidism    On Levothyroxine, continue to monitor      Obesity hypoventilation syndrome ([place])    Is doing much better on respirator and sleeping  Better, had been using Ativan for sleep but feels she can manage without it. We will try a course of Melatonin as needed for now.       CKD (chronic kidney disease) stage 4, GFR 15-29 ml/min ([place])    Follows with nephrology will request recoreds         I have discontinued Ms. Brummitt's traMADol and LORazepam. I am also having her maintain her  albuterol, insulin NPH Human, glucose blood, fluticasone, acetaminophen, OXYGEN, amiodarone, gabapentin, escitalopram, torsemide, carvedilol, insulin regular, levothyroxine, dicyclomine, diphenoxylate-atropine, and warfarin.  No orders of the defined types were placed in this encounter.   CMA served as Education administrator during this visit. History, Physical and Plan performed by medical provider. Documentation and orders reviewed and attested to.  Penni Homans, MD

## 2016-10-23 NOTE — Assessment & Plan Note (Signed)
Is doing much better on respirator and sleeping  Better, had been using Ativan for sleep but feels she can manage without it. We will try a course of Melatonin as needed for now.

## 2016-10-23 NOTE — Assessment & Plan Note (Signed)
On Levothyroxine, continue to monitor 

## 2016-10-23 NOTE — Assessment & Plan Note (Signed)
Follows with nephrology will request recoreds

## 2016-10-23 NOTE — Patient Instructions (Addendum)
Melatonin 2 mg to 10 mg at bed for sleep as needed  Benefiber twice daily  Insomnia Insomnia is a sleep disorder that makes it difficult to fall asleep or to stay asleep. Insomnia can cause tiredness (fatigue), low energy, difficulty concentrating, mood swings, and poor performance at work or school. There are three different ways to classify insomnia:  Difficulty falling asleep.  Difficulty staying asleep.  Waking up too early in the morning.  Any type of insomnia can be long-term (chronic) or short-term (acute). Both are common. Short-term insomnia usually lasts for three months or less. Chronic insomnia occurs at least three times a week for longer than three months. What are the causes? Insomnia may be caused by another condition, situation, or substance, such as:  Anxiety.  Certain medicines.  Gastroesophageal reflux disease (GERD) or other gastrointestinal conditions.  Asthma or other breathing conditions.  Restless legs syndrome, sleep apnea, or other sleep disorders.  Chronic pain.  Menopause. This may include hot flashes.  Stroke.  Abuse of alcohol, tobacco, or illegal drugs.  Depression.  Caffeine.  Neurological disorders, such as Alzheimer disease.  An overactive thyroid (hyperthyroidism).  The cause of insomnia may not be known. What increases the risk? Risk factors for insomnia include:  Gender. Women are more commonly affected than men.  Age. Insomnia is more common as you get older.  Stress. This may involve your professional or personal life.  Income. Insomnia is more common in people with lower income.  Lack of exercise.  Irregular work schedule or night shifts.  Traveling between different time zones.  What are the signs or symptoms? If you have insomnia, trouble falling asleep or trouble staying asleep is the main symptom. This may lead to other symptoms, such as:  Feeling fatigued.  Feeling nervous about going to sleep.  Not  feeling rested in the morning.  Having trouble concentrating.  Feeling irritable, anxious, or depressed.  How is this treated? Treatment for insomnia depends on the cause. If your insomnia is caused by an underlying condition, treatment will focus on addressing the condition. Treatment may also include:  Medicines to help you sleep.  Counseling or therapy.  Lifestyle adjustments.  Follow these instructions at home:  Take medicines only as directed by your health care provider.  Keep regular sleeping and waking hours. Avoid naps.  Keep a sleep diary to help you and your health care provider figure out what could be causing your insomnia. Include: ? When you sleep. ? When you wake up during the night. ? How well you sleep. ? How rested you feel the next day. ? Any side effects of medicines you are taking. ? What you eat and drink.  Make your bedroom a comfortable place where it is easy to fall asleep: ? Put up shades or special blackout curtains to block light from outside. ? Use a white noise machine to block noise. ? Keep the temperature cool.  Exercise regularly as directed by your health care provider. Avoid exercising right before bedtime.  Use relaxation techniques to manage stress. Ask your health care provider to suggest some techniques that may work well for you. These may include: ? Breathing exercises. ? Routines to release muscle tension. ? Visualizing peaceful scenes.  Cut back on alcohol, caffeinated beverages, and cigarettes, especially close to bedtime. These can disrupt your sleep.  Do not overeat or eat spicy foods right before bedtime. This can lead to digestive discomfort that can make it hard for you to sleep.  Limit screen use before bedtime. This includes: ? Watching TV. ? Using your smartphone, tablet, and computer.  Stick to a routine. This can help you fall asleep faster. Try to do a quiet activity, brush your teeth, and go to bed at the same  time each night.  Get out of bed if you are still awake after 15 minutes of trying to sleep. Keep the lights down, but try reading or doing a quiet activity. When you feel sleepy, go back to bed.  Make sure that you drive carefully. Avoid driving if you feel very sleepy.  Keep all follow-up appointments as directed by your health care provider. This is important. Contact a health care provider if:  You are tired throughout the day or have trouble in your daily routine due to sleepiness.  You continue to have sleep problems or your sleep problems get worse. Get help right away if:  You have serious thoughts about hurting yourself or someone else. This information is not intended to replace advice given to you by your health care provider. Make sure you discuss any questions you have with your health care provider. Document Released: 04/27/2000 Document Revised: 09/30/2015 Document Reviewed: 01/29/2014 Elsevier Interactive Patient Education  [employer].

## 2016-10-23 NOTE — Assessment & Plan Note (Signed)
Has been released from oncology and is doing very well.

## 2016-10-23 NOTE — Assessment & Plan Note (Signed)
minimize simple carbs. Increase exercise as tolerated. Continue current meds  

## 2016-10-24 ENCOUNTER — Ambulatory Visit (INDEPENDENT_AMBULATORY_CARE_PROVIDER_SITE_OTHER): Payer: Medicare Other | Admitting: Cardiovascular Disease

## 2016-10-24 DIAGNOSIS — S32020D Wedge compression fracture of second lumbar vertebra, subsequent encounter for fracture with routine healing: Secondary | ICD-10-CM | POA: Diagnosis not present

## 2016-10-24 DIAGNOSIS — E1122 Type 2 diabetes mellitus with diabetic chronic kidney disease: Secondary | ICD-10-CM | POA: Diagnosis not present

## 2016-10-24 DIAGNOSIS — I480 Paroxysmal atrial fibrillation: Secondary | ICD-10-CM

## 2016-10-24 DIAGNOSIS — I5042 Chronic combined systolic (congestive) and diastolic (congestive) heart failure: Secondary | ICD-10-CM | POA: Diagnosis not present

## 2016-10-24 DIAGNOSIS — Z8673 Personal history of transient ischemic attack (TIA), and cerebral infarction without residual deficits: Secondary | ICD-10-CM

## 2016-10-24 DIAGNOSIS — S22000D Wedge compression fracture of unspecified thoracic vertebra, subsequent encounter for fracture with routine healing: Secondary | ICD-10-CM | POA: Diagnosis not present

## 2016-10-24 DIAGNOSIS — Z5181 Encounter for therapeutic drug level monitoring: Secondary | ICD-10-CM

## 2016-10-24 DIAGNOSIS — I2510 Atherosclerotic heart disease of native coronary artery without angina pectoris: Secondary | ICD-10-CM | POA: Diagnosis not present

## 2016-10-24 DIAGNOSIS — I130 Hypertensive heart and chronic kidney disease with heart failure and stage 1 through stage 4 chronic kidney disease, or unspecified chronic kidney disease: Secondary | ICD-10-CM | POA: Diagnosis not present

## 2016-10-24 LAB — POCT INR: INR: 2.6

## 2016-10-31 DIAGNOSIS — E1122 Type 2 diabetes mellitus with diabetic chronic kidney disease: Secondary | ICD-10-CM | POA: Diagnosis not present

## 2016-10-31 DIAGNOSIS — S32020D Wedge compression fracture of second lumbar vertebra, subsequent encounter for fracture with routine healing: Secondary | ICD-10-CM | POA: Diagnosis not present

## 2016-10-31 DIAGNOSIS — I2510 Atherosclerotic heart disease of native coronary artery without angina pectoris: Secondary | ICD-10-CM | POA: Diagnosis not present

## 2016-10-31 DIAGNOSIS — S22000D Wedge compression fracture of unspecified thoracic vertebra, subsequent encounter for fracture with routine healing: Secondary | ICD-10-CM | POA: Diagnosis not present

## 2016-10-31 DIAGNOSIS — I130 Hypertensive heart and chronic kidney disease with heart failure and stage 1 through stage 4 chronic kidney disease, or unspecified chronic kidney disease: Secondary | ICD-10-CM | POA: Diagnosis not present

## 2016-10-31 DIAGNOSIS — I5042 Chronic combined systolic (congestive) and diastolic (congestive) heart failure: Secondary | ICD-10-CM | POA: Diagnosis not present

## 2016-11-01 DIAGNOSIS — N390 Urinary tract infection, site not specified: Secondary | ICD-10-CM | POA: Diagnosis not present

## 2016-11-05 ENCOUNTER — Telehealth: Payer: Self-pay | Admitting: Family Medicine

## 2016-11-05 ENCOUNTER — Telehealth: Payer: Self-pay

## 2016-11-05 NOTE — Telephone Encounter (Signed)
Returned call to patient as requested by voice mail.  She is requesting a letter signed by Dr Lovena Le providing all the conditions that Dr Lovena Le has treated. She requested to call her at [phone] when it is ready and prefers to have her husband pick it up at the office.  Advised would send request to Dr Tanna Furry nurse for follow up.

## 2016-11-05 NOTE — Telephone Encounter (Signed)
Caller name:Jadzia Charrier Relationship to patient: Can be reached:[phone] Pharmacy:  Reason for call:needs a letter stating her current and pass medical history. Please call when ready for pick up.

## 2016-11-09 ENCOUNTER — Ambulatory Visit (INDEPENDENT_AMBULATORY_CARE_PROVIDER_SITE_OTHER): Payer: Medicare Other

## 2016-11-09 DIAGNOSIS — Z95810 Presence of automatic (implantable) cardiac defibrillator: Secondary | ICD-10-CM | POA: Diagnosis not present

## 2016-11-09 DIAGNOSIS — I5022 Chronic systolic (congestive) heart failure: Secondary | ICD-10-CM | POA: Diagnosis not present

## 2016-11-09 NOTE — Telephone Encounter (Signed)
Please advise 

## 2016-11-09 NOTE — Progress Notes (Signed)
EPIC Encounter for ICM Monitoring  Patient Name: Destiny [name] is a 76 y.o. female Date: 11/09/2016 Primary Care Physican: Mosie Lukes, MD Primary Cardiologist: [place] Electrophysiologist: Lovena Le Nephrologist: [place] Dry Weight: Last known weight228lb  Bi-V Pacing: >99% Battery Longevity: 5.3 months      Heart Failure questions reviewed, pt asymptomatic.   Thoracic impedance normal.  Prescribed and confirmed: Torsemide 20-40 mg by mouth as directed. 2 tablets daily on Friday, Saturday &Sunday, 1 tablet daily Monday through Thursday.  Labs: 10/15/2016 Creatinine 2.4,   BUN 31.2,Potassium 4.1, Sodium 135 09/28/2016 Creatinine 2.19, BUN 20, Potassium 4.4, Sodium 137, EGFR 21-24 09/10/2016 Creatinine 2.32, BUN 31, Potassium 4.9, Sodium 135, EGFR 20-23 09/05/2016 Creatinine 3.02, BUN 35, Potassium 4.2, Sodium 138, EGFR 14-17 09/04/2016 Creatinine 2.75, BUN 36, Potassium 3.7, Sodium 136, EGFR 16-19  08/27/2016 Creatinine 3.02, BUN 42, Potassium 4.7, Sodium 134  08/20/2016 Creatinine 3.1, BUN 44.6, Potassium 4.4, Sodium 139  07/30/2016 Creatinine 3.4, BUN 44, Potassium 4.2, Sodium 137 05/21/2016 Creatinine 4.3, BUN 58, Potassium 4.4, Sodium 134 04/27/2016 Creatinine 3.73, BUN 44, Potassium 4.8, Sodium 137, EGFR 12.62 04/23/2016 Creatinine 3.74, BUN 49, Potassium 4.8, Sodium 137, EGFR 12.59  04/20/2016 Creatinine 3.43, BUN 48, Potassium 4.7, Sodium 137, EGFR 13.91  04/19/2016 Creatinine 3.32, BUN 51, Potassium 4.6, Sodium 137, EGFR 14.44  01/09/2016 Creatinine 2.33, BUN 61, Potassium 4.2, Sodium 138   Recommendations: No changes.   Encouraged to call for fluid symptoms.  Follow-up plan: ICM clinic phone appointment on 12/10/2016.  Office appointment scheduled 01/18/2017 with Dr. Lovena Le.  Copy of ICM check sent to device physician.   3 month ICM trend: 11/09/2016   1 Year ICM trend:      [name],  RN 11/09/2016 8:58 AM

## 2016-11-09 NOTE — Telephone Encounter (Signed)
Please write her a letter containing her PMH off of problemlist

## 2016-11-13 NOTE — Telephone Encounter (Signed)
Letter typed and put in front for pick up patient made aware.   pc

## 2016-11-14 ENCOUNTER — Other Ambulatory Visit: Payer: Self-pay | Admitting: Family Medicine

## 2016-11-15 ENCOUNTER — Telehealth: Payer: Self-pay

## 2016-11-15 NOTE — Telephone Encounter (Signed)
Called, spoke with pt. Pt informed she would like a list of her medical conditions. Informed I could print off her last office note with Dr. Lovena Le from 12/28/15, which contains the information requested. Informed I will have it at the check-in desk for her to pickup. Pt verbalized understanding and thanked me for my assistance.

## 2016-11-16 ENCOUNTER — Ambulatory Visit (INDEPENDENT_AMBULATORY_CARE_PROVIDER_SITE_OTHER): Payer: Medicare Other | Admitting: *Deleted

## 2016-11-16 DIAGNOSIS — Z5181 Encounter for therapeutic drug level monitoring: Secondary | ICD-10-CM

## 2016-11-16 DIAGNOSIS — Z8673 Personal history of transient ischemic attack (TIA), and cerebral infarction without residual deficits: Secondary | ICD-10-CM | POA: Diagnosis not present

## 2016-11-16 DIAGNOSIS — I480 Paroxysmal atrial fibrillation: Secondary | ICD-10-CM | POA: Diagnosis not present

## 2016-11-16 DIAGNOSIS — I255 Ischemic cardiomyopathy: Secondary | ICD-10-CM

## 2016-11-16 LAB — POCT INR: INR: 2.7

## 2016-12-02 ENCOUNTER — Other Ambulatory Visit: Payer: Self-pay | Admitting: Family Medicine

## 2016-12-10 ENCOUNTER — Ambulatory Visit (INDEPENDENT_AMBULATORY_CARE_PROVIDER_SITE_OTHER): Payer: Medicare Other | Admitting: *Deleted

## 2016-12-10 ENCOUNTER — Ambulatory Visit (INDEPENDENT_AMBULATORY_CARE_PROVIDER_SITE_OTHER): Payer: Medicare Other

## 2016-12-10 DIAGNOSIS — Z95810 Presence of automatic (implantable) cardiac defibrillator: Secondary | ICD-10-CM | POA: Diagnosis not present

## 2016-12-10 DIAGNOSIS — I255 Ischemic cardiomyopathy: Secondary | ICD-10-CM

## 2016-12-10 DIAGNOSIS — I5022 Chronic systolic (congestive) heart failure: Secondary | ICD-10-CM | POA: Diagnosis not present

## 2016-12-10 NOTE — Progress Notes (Signed)
EPIC Encounter for ICM Monitoring  Patient Name: Destiny [name] is a 76 y.o. female Date: 12/10/2016 Primary Care Physican: Mosie Lukes, MD Primary Cardiologist: [place] Electrophysiologist: Lovena Le Nephrologist: [place] Dry Weight: Last known weight228lb  Bi-V Pacing: >99% Battery Longevity: 4.3 months        Heart Failure questions reviewed, pt asymptomatic.   Thoracic impedance normal.  Prescribed dosage: Torsemide 20-40 mg by mouth as directed, 2 tablets daily on Friday, Saturday &Sunday, 1 tablet daily Monday through Thursday.  Labs: 10/15/2016 Creatinine 2.4,   BUN 31.2,Potassium 4.1, Sodium 135 09/28/2016 Creatinine 2.19, BUN 20, Potassium 4.4, Sodium 137, EGFR 21-24 09/10/2016 Creatinine 2.32, BUN 31, Potassium 4.9, Sodium 135, EGFR 20-23 09/05/2016 Creatinine 3.02, BUN 35, Potassium 4.2, Sodium 138, EGFR 14-17 09/04/2016 Creatinine 2.75, BUN 36, Potassium 3.7, Sodium 136, EGFR 16-19  08/27/2016 Creatinine 3.02, BUN 42, Potassium 4.7, Sodium 134  08/20/2016 Creatinine 3.1, BUN 44.6, Potassium 4.4, Sodium 139  07/30/2016 Creatinine 3.4, BUN 44, Potassium 4.2, Sodium 137 05/21/2016 Creatinine 4.3, BUN 58, Potassium 4.4, Sodium 134 04/27/2016 Creatinine 3.73, BUN 44, Potassium 4.8, Sodium 137, EGFR 12.62 04/23/2016 Creatinine 3.74, BUN 49, Potassium 4.8, Sodium 137, EGFR 12.59  04/20/2016 Creatinine 3.43, BUN 48, Potassium 4.7, Sodium 137, EGFR 13.91  04/19/2016 Creatinine 3.32, BUN 51, Potassium 4.6, Sodium 137, EGFR 14.44  01/09/2016 Creatinine 2.33, BUN 61, Potassium 4.2, Sodium 138   Recommendations: No changes.  Encouraged to call for fluid symptoms.  Follow-up plan: ICM clinic phone appointment on 01/10/2017.  Office appointment scheduled 01/18/2017 with Dr. Lovena Le.  Copy of ICM check sent to device physician.   3 month ICM trend: 12/10/2016   1 Year ICM trend:      [name], RN 12/10/2016 8:52  AM

## 2016-12-11 NOTE — Progress Notes (Signed)
Remote ICD transmission.   

## 2016-12-12 ENCOUNTER — Other Ambulatory Visit: Payer: Self-pay | Admitting: Cardiovascular Disease

## 2016-12-12 ENCOUNTER — Ambulatory Visit (INDEPENDENT_AMBULATORY_CARE_PROVIDER_SITE_OTHER): Payer: Medicare Other | Admitting: *Deleted

## 2016-12-12 DIAGNOSIS — Z5181 Encounter for therapeutic drug level monitoring: Secondary | ICD-10-CM | POA: Diagnosis not present

## 2016-12-12 DIAGNOSIS — Z8673 Personal history of transient ischemic attack (TIA), and cerebral infarction without residual deficits: Secondary | ICD-10-CM

## 2016-12-12 DIAGNOSIS — I255 Ischemic cardiomyopathy: Secondary | ICD-10-CM

## 2016-12-12 DIAGNOSIS — I480 Paroxysmal atrial fibrillation: Secondary | ICD-10-CM | POA: Diagnosis not present

## 2016-12-12 LAB — POCT INR: INR: 8

## 2016-12-12 LAB — PROTIME-INR
INR: 7.1 — AB (ref 0.8–1.2)
PROTHROMBIN TIME: 66.6 s — AB (ref 9.1–12.0)

## 2016-12-13 ENCOUNTER — Telehealth: Payer: Self-pay | Admitting: Emergency Medicine

## 2016-12-13 LAB — PROTIME-INR
INR: 6.4 (ref 0.8–1.2)
Prothrombin Time: 59.5 s — ABNORMAL HIGH (ref 9.1–12.0)

## 2016-12-13 NOTE — Telephone Encounter (Signed)
Pt inappropriately went to [place] for INR draw immediately following INR appt in Coumadin clinic - based on old order in epic. Per [facility] INR from our visit would be more accurate due to way lab was run and timing. Thus we did not dose this result.

## 2016-12-13 NOTE — Telephone Encounter (Signed)
Received page from [employer] this morning that the patient had a critical INR value of 6.4.  She goes to the Coumadin clinic and this appears to be improved from yesterday. I will forward this message to the Coumadin Clinic for further management. She will need to withhold her Coumadin for another 1-2 days.

## 2016-12-14 ENCOUNTER — Encounter: Payer: Self-pay | Admitting: Cardiology

## 2016-12-17 ENCOUNTER — Ambulatory Visit (INDEPENDENT_AMBULATORY_CARE_PROVIDER_SITE_OTHER): Payer: Medicare Other | Admitting: *Deleted

## 2016-12-17 DIAGNOSIS — I480 Paroxysmal atrial fibrillation: Secondary | ICD-10-CM

## 2016-12-17 DIAGNOSIS — I255 Ischemic cardiomyopathy: Secondary | ICD-10-CM

## 2016-12-17 DIAGNOSIS — Z5181 Encounter for therapeutic drug level monitoring: Secondary | ICD-10-CM

## 2016-12-17 DIAGNOSIS — Z8673 Personal history of transient ischemic attack (TIA), and cerebral infarction without residual deficits: Secondary | ICD-10-CM

## 2016-12-17 LAB — POCT INR: INR: 1.5

## 2016-12-19 DIAGNOSIS — I429 Cardiomyopathy, unspecified: Secondary | ICD-10-CM | POA: Diagnosis not present

## 2016-12-19 DIAGNOSIS — N179 Acute kidney failure, unspecified: Secondary | ICD-10-CM | POA: Diagnosis not present

## 2016-12-19 DIAGNOSIS — D638 Anemia in other chronic diseases classified elsewhere: Secondary | ICD-10-CM | POA: Diagnosis not present

## 2016-12-19 DIAGNOSIS — E669 Obesity, unspecified: Secondary | ICD-10-CM | POA: Diagnosis not present

## 2016-12-19 DIAGNOSIS — N2581 Secondary hyperparathyroidism of renal origin: Secondary | ICD-10-CM | POA: Diagnosis not present

## 2016-12-19 DIAGNOSIS — G4733 Obstructive sleep apnea (adult) (pediatric): Secondary | ICD-10-CM | POA: Diagnosis not present

## 2016-12-19 DIAGNOSIS — N184 Chronic kidney disease, stage 4 (severe): Secondary | ICD-10-CM | POA: Diagnosis not present

## 2016-12-19 DIAGNOSIS — D509 Iron deficiency anemia, unspecified: Secondary | ICD-10-CM | POA: Diagnosis not present

## 2016-12-19 DIAGNOSIS — E1129 Type 2 diabetes mellitus with other diabetic kidney complication: Secondary | ICD-10-CM | POA: Diagnosis not present

## 2016-12-19 DIAGNOSIS — I4891 Unspecified atrial fibrillation: Secondary | ICD-10-CM | POA: Diagnosis not present

## 2016-12-19 DIAGNOSIS — I129 Hypertensive chronic kidney disease with stage 1 through stage 4 chronic kidney disease, or unspecified chronic kidney disease: Secondary | ICD-10-CM | POA: Diagnosis not present

## 2016-12-26 ENCOUNTER — Ambulatory Visit (INDEPENDENT_AMBULATORY_CARE_PROVIDER_SITE_OTHER): Payer: Medicare Other | Admitting: Pharmacist

## 2016-12-26 ENCOUNTER — Encounter: Payer: Self-pay | Admitting: Internal Medicine

## 2016-12-26 DIAGNOSIS — I255 Ischemic cardiomyopathy: Secondary | ICD-10-CM

## 2016-12-26 DIAGNOSIS — I480 Paroxysmal atrial fibrillation: Secondary | ICD-10-CM

## 2016-12-26 DIAGNOSIS — Z8673 Personal history of transient ischemic attack (TIA), and cerebral infarction without residual deficits: Secondary | ICD-10-CM

## 2016-12-26 DIAGNOSIS — Z5181 Encounter for therapeutic drug level monitoring: Secondary | ICD-10-CM

## 2016-12-26 LAB — POCT INR: INR: 3.5

## 2017-01-04 ENCOUNTER — Ambulatory Visit (INDEPENDENT_AMBULATORY_CARE_PROVIDER_SITE_OTHER): Payer: Medicare Other | Admitting: *Deleted

## 2017-01-04 DIAGNOSIS — Z8673 Personal history of transient ischemic attack (TIA), and cerebral infarction without residual deficits: Secondary | ICD-10-CM

## 2017-01-04 DIAGNOSIS — I480 Paroxysmal atrial fibrillation: Secondary | ICD-10-CM

## 2017-01-04 DIAGNOSIS — I255 Ischemic cardiomyopathy: Secondary | ICD-10-CM

## 2017-01-04 DIAGNOSIS — Z5181 Encounter for therapeutic drug level monitoring: Secondary | ICD-10-CM

## 2017-01-04 LAB — POCT INR: INR: 3.2

## 2017-01-10 ENCOUNTER — Ambulatory Visit (INDEPENDENT_AMBULATORY_CARE_PROVIDER_SITE_OTHER): Payer: Medicare Other | Admitting: *Deleted

## 2017-01-10 DIAGNOSIS — I5022 Chronic systolic (congestive) heart failure: Secondary | ICD-10-CM | POA: Diagnosis not present

## 2017-01-10 DIAGNOSIS — Z95810 Presence of automatic (implantable) cardiac defibrillator: Secondary | ICD-10-CM

## 2017-01-10 DIAGNOSIS — I255 Ischemic cardiomyopathy: Secondary | ICD-10-CM

## 2017-01-10 NOTE — Progress Notes (Signed)
Remote ICD transmission.   

## 2017-01-10 NOTE — Progress Notes (Signed)
EPIC Encounter for ICM Monitoring  Patient Name: Destiny [name] is a 76 y.o. female Date: 01/10/2017 Primary Care Physican: Mosie Lukes, MD Primary Cardiologist: [place] Electrophysiologist: Lovena Le Nephrologist: [place] Dry Weight: 233lb  Bi-V Pacing: >99% Battery Longevity: 4.43month      Heart Failure questions reviewed, pt asymptomatic with weight gain and swelling of ankles which is better today   Thoracic impedance appears to be at baseline today but has been abnormal suggesting fluid accumulation for 18 days during month of August.  Prescribed dosage: Torsemide 20-40 mg by mouth as directed, 2 tablets daily on Friday, Saturday &Sunday, 1 tablet daily Monday through Thursday.  Labs: 10/15/2016 Creatinine 2.4, BUN 31.2,Potassium 4.1, Sodium 135 09/28/2016 Creatinine 2.19, BUN 20, Potassium 4.4, Sodium 137, EGFR 21-24 09/10/2016 Creatinine 2.32, BUN 31, Potassium 4.9, Sodium 135, EGFR 20-23 09/05/2016 Creatinine 3.02, BUN 35, Potassium 4.2, Sodium 138, EGFR 14-17 09/04/2016 Creatinine 2.75, BUN 36, Potassium 3.7, Sodium 136, EGFR 16-19  08/27/2016 Creatinine 3.02, BUN 42, Potassium 4.7, Sodium 134  08/20/2016 Creatinine 3.1, BUN 44.6, Potassium 4.4, Sodium 139  07/30/2016 Creatinine 3.4, BUN 44, Potassium 4.2, Sodium 137 05/21/2016 Creatinine 4.3, BUN 58, Potassium 4.4, Sodium 134 04/27/2016 Creatinine 3.73, BUN 44, Potassium 4.8, Sodium 137, EGFR 12.62  Recommendations: She has been taking extra Torsemide when needed.  She said she thinks she has been retaining fluid because she made vegetable soup with canned vegetables.   Follow-up plan: ICM clinic phone appointment on 02/19/2017.  Office appointment scheduled 01/18/2017 with Dr. TLovena Le  Advised her she is due to make an appointment with Dr BHaroldine Laws Copy of ICM check sent to Dr. TLovena Leand Dr. BHaroldine Laws   3 month ICM trend: 01/10/2017   1 Year ICM trend:       LRosalene Billings RN 01/10/2017 3:25 PM

## 2017-01-15 LAB — CUP PACEART REMOTE DEVICE CHECK
Battery Remaining Longevity: 4 mo
Battery Remaining Percentage: 6 %
Battery Voltage: 2.63 V
Brady Statistic AP VP Percent: 99 %
Brady Statistic AS VP Percent: 1 %
HIGH POWER IMPEDANCE MEASURED VALUE: 44 Ohm
Implantable Lead Implant Date: 20070322
Implantable Lead Location: 753858
Implantable Lead Location: 753860
Implantable Pulse Generator Implant Date: 20120426
Lead Channel Impedance Value: 310 Ohm
Lead Channel Pacing Threshold Amplitude: 0.75 V
Lead Channel Pacing Threshold Amplitude: 0.875 V
Lead Channel Pacing Threshold Amplitude: 1 V
Lead Channel Pacing Threshold Pulse Width: 0.5 ms
Lead Channel Sensing Intrinsic Amplitude: 0.9 mV
Lead Channel Setting Pacing Amplitude: 2 V
Lead Channel Setting Pacing Amplitude: 2.5 V
Lead Channel Setting Sensing Sensitivity: 0.5 mV
MDC IDC LEAD IMPLANT DT: 20070322
MDC IDC LEAD IMPLANT DT: 20070322
MDC IDC LEAD LOCATION: 753859
MDC IDC MSMT LEADCHNL LV PACING THRESHOLD PULSEWIDTH: 0.6 ms
MDC IDC MSMT LEADCHNL RA IMPEDANCE VALUE: 290 Ohm
MDC IDC MSMT LEADCHNL RA SENSING INTR AMPL: 3.6 mV
MDC IDC MSMT LEADCHNL RV IMPEDANCE VALUE: 360 Ohm
MDC IDC MSMT LEADCHNL RV PACING THRESHOLD PULSEWIDTH: 0.5 ms
MDC IDC PG SERIAL: 631685
MDC IDC SESS DTM: 20180730094815
MDC IDC SET LEADCHNL LV PACING PULSEWIDTH: 0.6 ms
MDC IDC SET LEADCHNL RA PACING AMPLITUDE: 2 V
MDC IDC SET LEADCHNL RV PACING PULSEWIDTH: 0.5 ms
MDC IDC STAT BRADY AP VS PERCENT: 1 %
MDC IDC STAT BRADY AS VS PERCENT: 1 %
MDC IDC STAT BRADY RA PERCENT PACED: 99 %

## 2017-01-18 ENCOUNTER — Encounter (INDEPENDENT_AMBULATORY_CARE_PROVIDER_SITE_OTHER): Payer: Self-pay

## 2017-01-18 ENCOUNTER — Encounter: Payer: Self-pay | Admitting: Internal Medicine

## 2017-01-18 ENCOUNTER — Ambulatory Visit (INDEPENDENT_AMBULATORY_CARE_PROVIDER_SITE_OTHER): Payer: Medicare Other | Admitting: *Deleted

## 2017-01-18 ENCOUNTER — Ambulatory Visit (INDEPENDENT_AMBULATORY_CARE_PROVIDER_SITE_OTHER): Payer: Medicare Other | Admitting: Internal Medicine

## 2017-01-18 VITALS — BP 118/60 | HR 70 | Ht 64.0 in | Wt 232.8 lb

## 2017-01-18 DIAGNOSIS — Z5181 Encounter for therapeutic drug level monitoring: Secondary | ICD-10-CM

## 2017-01-18 DIAGNOSIS — Z8673 Personal history of transient ischemic attack (TIA), and cerebral infarction without residual deficits: Secondary | ICD-10-CM | POA: Diagnosis not present

## 2017-01-18 DIAGNOSIS — I2589 Other forms of chronic ischemic heart disease: Secondary | ICD-10-CM

## 2017-01-18 DIAGNOSIS — I255 Ischemic cardiomyopathy: Secondary | ICD-10-CM

## 2017-01-18 DIAGNOSIS — I480 Paroxysmal atrial fibrillation: Secondary | ICD-10-CM

## 2017-01-18 DIAGNOSIS — I5022 Chronic systolic (congestive) heart failure: Secondary | ICD-10-CM

## 2017-01-18 LAB — POCT INR: INR: 2.4

## 2017-01-18 NOTE — Progress Notes (Signed)
HPI Mrs. Ferch returns today for follow-up. She is a morbidly obese 76 year old woman with chronic systolic heart failure, and ischemic cardiomyopathy status post bypass surgery, chronic renal insufficiency, status post biventricular ICD implantation. The patient has had problems with atrial fibrillation in the past, and we reluctantly placed her on amiodarone despite her underlying COPD. She has done well with maintaining sinus rhythm. She is approaching elective replacement. Her creatinine is in the 2-1/2 range. She has had no ICD shocks. Her volume status has remained fairly stable. Allergies  Allergen Reactions  . Sulfonamide Derivatives Other (See Comments)    Reaction unknown; "childhood allergy/mother"     Current Outpatient Prescriptions  Medication Sig Dispense Refill  . acetaminophen (TYLENOL) 500 MG tablet Take 1,000 mg by mouth 2 (two) times daily as needed for mild pain.     Marland Kitchen albuterol (PROVENTIL HFA;VENTOLIN HFA) 108 (90 BASE) MCG/ACT inhaler Inhale 2 puffs into the lungs every 6 (six) hours as needed for wheezing or shortness of breath. 1 Inhaler 1  . amiodarone (PACERONE) 200 MG tablet Take 200 mg by mouth daily.    . carvedilol (COREG) 25 MG tablet Take 1 tablet (25 mg total) by mouth 2 (two) times daily. 360 tablet 3  . dicyclomine (BENTYL) 20 MG tablet Take 1 tablet (20 mg total) by mouth 2 (two) times daily as needed for spasms. 30 tablet 0  . diphenoxylate-atropine (LOMOTIL) 2.5-0.025 MG tablet Take 1 tablet by mouth 4 (four) times daily as needed for diarrhea or loose stools. 30 tablet 0  . escitalopram (LEXAPRO) 10 MG tablet TAKE 1 TABLET EVERY DAY 90 tablet 1  . fluticasone (FLONASE) 50 MCG/ACT nasal spray Place 2 sprays into both nostrils daily as needed for allergies or rhinitis. As needed for nasal stuffiness. (Patient taking differently: Place 2 sprays into both nostrils daily as needed for allergies or rhinitis. ) 16 g 0  . gabapentin (NEURONTIN) 300 MG  capsule Take 300 mg by mouth at bedtime.    Marland Kitchen glucose blood (FREESTYLE LITE) test strip DX: 250.60  Check sugars bid and as needed (Patient taking differently: 1 each by Other route See admin instructions. Check blood sugar twice daily) 100 each 2  . insulin NPH (HUMULIN N,NOVOLIN N) 100 UNIT/ML injection Inject 7-40 Units into the skin See admin instructions. 40 units in the morning before breakfast then 7 units in the evening before supper/evening meal    . insulin regular (HUMULIN R) 250 units/2.33mL (100 units/mL) injection Inject 5 Units into the skin daily before breakfast.    . levothyroxine (SYNTHROID, LEVOTHROID) 25 MCG tablet TAKE 1 TABLET EVERY DAY BEFORE BREAKFAST 90 tablet 1  . OXYGEN Inhale 2 L into the lungs continuous.     . torsemide (DEMADEX) 20 MG tablet Take 20-40 mg by mouth See admin instructions. 40 mg in the morning on Sun/Fri/Sat and 20 mg on Mon/Tues/Wed/Thurs    . warfarin (COUMADIN) 5 MG tablet TAKE AS DIRECTED BY ANTICOAGULATION CLINIC. 90 tablet 1   No current facility-administered medications for this visit.      Past Medical History:  Diagnosis Date  . AICD (automatic cardioverter/defibrillator) present   . Anemia, unspecified 12/13/2013  . Back pain, chronic    "just when I walk; mass on 3rd and 4th vertebrae right lower back"  . Biventricular implantable cardiac defibrillator in situ 2007, 2012   a. 2007;  b. 2012 Gen change: SJM 3231-40 Uni BiV ICD, ser # [id number].  Marland Kitchen CAD (  coronary artery disease)    a. 05/2001 CABG x4: LIMA->LAD, VG->D1, VG->D2, VG->RCA.  . Cardiomyopathy, ischemic    a. 2012 s/p SJM 3231-40 Uni BiV ICD, ser # [id number].  . Carotid stenosis    a. 10/19/2011 carotid duplex - Mild hard plaque bilaterally. Stable 40-59% bilateral ICA stenosis. Carotid US (04/2013):  Bilateral 40-59% ICA.  F/u 1 year  . Cerumen impaction 10/28/2015  . Chronic combined systolic and diastolic CHF (congestive heart failure) (HCC)    a. EF as low as 25% in 2006;  b. EF  60-65% in 12/2011;  c. 02/2013 Echo: EF 35-40, mod mid-dist antsept HK, Gr 2 DD, mild LVH.  . CKD (chronic kidney disease), stage IV ([place])    hx/notes 09/04/2016  . COPD (chronic obstructive pulmonary disease) ([place])   . COPD, severe ([place]) 01/22/2016  . 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 Dyslipidemia   . Gout ~ 08/2011  . History of vertebral fracture 09/18/2016  . Hypercalcemia 09/26/2013  . Hypertension   . Hypothyroidism   . Hypoxemia requiring supplemental oxygen   . LBBB (left bundle branch block)   . Lumbar compression fracture ([place])    L2/notes 09/04/2016  . Morbid obesity with BMI of 45.0-49.9, adult (HCC)    Ht. 5'4". BMI 47.2  . Mural thrombus of left ventricle    before 2003, while on Coumadin, No h/o CVA  . 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 Benay Spice, in remission  . On home oxygen therapy    "normally 2-3L; 24/7" (12/14/2015); "2L; 24/7" (09/04/2016)  . OSA on CPAP   . PAF (paroxysmal atrial fibrillation) (HCC)    on Coumadin  . Presence of permanent cardiac pacemaker   . Retinopathy due to secondary diabetes ([place])    type II, uncontrolled  . Urinary incontinence 06/25/2016  . Vertebral fracture, osteoporotic ([place]) 08/27/2016  . Vitamin D deficiency 06/09/2012   historical    ROS:   All systems reviewed and negative except as noted in the HPI.   Past Surgical History:  Procedure Laterality Date  . ABDOMINAL HYSTERECTOMY  1982  . CARDIAC CATHETERIZATION  06/03/01  . CARDIOVERSION N/A 07/30/2014   Procedure: CARDIOVERSION;  Surgeon: Jolaine Artist, MD;  Location: [facility] ENDOSCOPY;  Service: Cardiovascular;  Laterality: N/A;  . CARDIOVERSION N/A 11/25/2014   Procedure: CARDIOVERSION;  Surgeon: Evans Lance, MD;  Location: [facility];  Service: Cardiovascular;  Laterality: N/A;  . CATARACT EXTRACTION     left eye  . CHOLECYSTECTOMY  1970  . CORONARY  ARTERY BYPASS GRAFT  2003   CABG X4  . INSERT / REPLACE / REMOVE PACEMAKER  2007; 2012   w/AICD  . IR KYPHO LUMBAR INC FX REDUCE BONE BX UNI/BIL CANNULATION INC/IMAGING  09/11/2016  . REFRACTIVE SURGERY     right eye  . TEE WITHOUT CARDIOVERSION N/A 12/16/2015   Procedure: TRANSESOPHAGEAL ECHOCARDIOGRAM (TEE);  Surgeon: Jolaine Artist, MD;  Location[facility] END[facility]COPY;  Service: Cardiovascular;  Laterality: N/A;  . TUBAL LIGATION  1972     Family History  Problem Relation Age of Onset  . Kidney cancer Mother        kidney and female repo - died @ 10  . Asthma Mother   . Cancer Mother        gyn and renal  . Heart disease Mother        CAD  .  Heart disease Father        died @ 38  . Stroke Father   . Diabetes Father   . Hypertension Father   . Hyperlipidemia Father   . Heart attack Father   . Hypertension Son   . Kidney cancer Maternal Uncle   . Cancer Maternal Uncle   . Cirrhosis Maternal Grandmother        non alcohol  . Cancer Maternal Grandfather   . Kidney cancer Maternal Grandfather   . Hypertension Maternal Aunt      Social History   Social History  . Marital status: Married    Spouse name: N/A  . Number of children: Y  . Years of education: N/A   Occupational History  . 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    Social History Main Topics  . Smoking status: Never Smoker  . Smokeless tobacco: Never Used  . Alcohol use No  . Drug use: No  . Sexual activity: Not Currently     Comment: lives with husband, no major dietary restrictions   Other Topics Concern  . Not on file   Social History Narrative   Lives in [place] with her husband.  She does not routinely exercise or adhere to any particular diet.       BP 118/60   Pulse 70   Ht 5\' 4"  (1.626 m)   Wt 232 lb 12.8 oz (105.6 kg)   BMI 39.96 kg/m   Physical Exam:  Well appearing NAD HEENT: Unremarkable Neck:  No JVD, no thyromegally Lymphatics:  No adenopathy Back:  No CVA  tenderness Lungs:  Clear HEART:  Regular rate rhythm, no murmurs, no rubs, no clicks Abd:  soft, positive bowel sounds, no organomegally, no rebound, no guarding Ext:  2 plus pulses, no edema, no cyanosis, no clubbing Skin:  No rashes no nodules Neuro:  CN II through XII intact, motor grossly intact  DEVICE  Normal device function.  See PaceArt for details.   Assess/Plan: 1. Chronic systolic heart failure - her symptoms are class II. She will continue a low-sodium diet and her medical therapy. 2. Biventricular ICD - her biventricular St. Jude ICD is approaching elective replacement. When she trips elective replacement, we will schedule her for a down grade from a biventricular ICD to a biventricular pacemaker as she has had no intercurrent ICD therapies and has multiple comorbidities. 3. Morbid obesity - as before, we have encouraged the patient to lose weight. 4. Oxygen-dependent COPD - she will continue her bronchodilators. She does not smoke.  Cristopher Peru, M.D.

## 2017-01-18 NOTE — Patient Instructions (Signed)
Medication Instructions:  Your physician recommends that you continue on your current medications as directed. Please refer to the Current Medication list given to you today.  Labwork: None ordered.  Testing/Procedures: None ordered.  Follow-Up: Your physician wants you to follow-up in: one year with Dr. Lovena Le.   You will receive a reminder letter in the mail two months in advance. If you don't receive a letter, please call our office to schedule the follow-up appointment.  Remote monitoring is used to monitor your ICD from home. This monitoring reduces the number of office visits required to check your device to one time per year. It allows [place] to keep an eye on the functioning of your device to ensure it is working properly. You are scheduled for a device check from home on 02/19/2017 and 04/11/2017. You may send your transmission at any time that day. If you have a wireless device, the transmission will be sent automatically. After your physician reviews your transmission, you will receive a postcard with your next transmission date.    Any Other Special Instructions Will Be Listed Below (If Applicable).     If you need a refill on your cardiac medications before your next appointment, please call your pharmacy.

## 2017-01-22 ENCOUNTER — Encounter: Payer: Self-pay | Admitting: Cardiology

## 2017-01-24 ENCOUNTER — Ambulatory Visit (INDEPENDENT_AMBULATORY_CARE_PROVIDER_SITE_OTHER): Payer: Medicare Other | Admitting: Pulmonary Disease

## 2017-01-24 ENCOUNTER — Encounter: Payer: Self-pay | Admitting: Pulmonary Disease

## 2017-01-24 DIAGNOSIS — J9611 Chronic respiratory failure with hypoxia: Secondary | ICD-10-CM

## 2017-01-24 DIAGNOSIS — I255 Ischemic cardiomyopathy: Secondary | ICD-10-CM

## 2017-01-24 DIAGNOSIS — G4733 Obstructive sleep apnea (adult) (pediatric): Secondary | ICD-10-CM

## 2017-01-24 NOTE — Assessment & Plan Note (Signed)
We will review report from [place] on your sleep machine.  Weight loss encouraged, compliance with goal of at least 4-6 hrs every night is the expectation. Advised against medications with sedative side effects Cautioned against driving when sleepy - understanding that sleepiness will vary on a day to day basis

## 2017-01-24 NOTE — Progress Notes (Signed)
   Subjective:    [name], female    DOB: 1941-02-24, 76 y.o.   MRN: [id number]  HPI  77 yo obese,never smoker for follow-up of severe obstructive sleep apnea. She has a history of ischemic cardiomyopathy, s/p biventricular ICD implantation.  She has a history of chronic congestive heart failure, with a chronically elevated BNP. She also has chronic renal insufficiency, is oxygen dependent, and is on chronic anticoagulation for paroxysmal atrial fibrillation.  She has never smoked, but was told around the age of 30 that she may have asthma.   She was maintained on CPAP since 2003 but after hospital admission in 12/2015 was placed on noninvasive ventilation 12/6 with full face mask with 5 L oxygen blended in Accompanied by her husband today Last download was checked on 03/2016 and showed pressure of 24/9. She now uses 2 L of oxygen blended in and has been doing well. No interim acute visits or hospitalization. She is compliant with NIV and oxygen. Continues to use a motorized wheelchair due to limited mobility. Denies wheezing is compliant with her diuretics and denies pedal edema, orthopnea or paroxysmal nocturnal dyspnea, shortness of breath is at baseline    Significant tests/ events  NPSG 2003: AHI 41/hr, cpap titrated to 10cm PFTs 10/2014 >>FEV1 48%, no BD response, TLC 50%, DLCO 44%  12/2015 adm for for acute on chronic respiratory failure secondary to acute on chronic combined CHF, decompensated OSA/OHS and resultant secondary PAH with decompensated cor pulmonale. She improved with volume removal and bipap and was discharged with nocturnal bipap 12/6.    Review of Systems neg for any significant sore throat, dysphagia, itching, sneezing, nasal congestion or excess/ purulent secretions, fever, chills, sweats, unintended wt loss, pleuritic or exertional cp, hempoptysis, orthopnea pnd or change in chronic leg swelling. Also denies presyncope, palpitations,  heartburn, abdominal pain, nausea, vomiting, diarrhea or change in bowel or urinary habits, dysuria,hematuria, rash, arthralgias, visual complaints, headache, numbness weakness or ataxia.     Objective:   Physical Exam  Gen. Pleasant, obese, in no distress ENT - no lesions, no post nasal drip Neck: No JVD, no thyromegaly, no carotid bruits Lungs: no use of accessory muscles, no dullness to percussion, decreased without rales or rhonchi  Cardiovascular: Rhythm regular, heart sounds  normal, no murmurs or gallops, no peripheral edema Musculoskeletal: No deformities, no cyanosis or clubbing , no tremors       Assessment & Plan:

## 2017-01-24 NOTE — Assessment & Plan Note (Addendum)
Flu shot recommended Ct 2l o2 blended into machine Ct NIV during sleep -this is really helped her and decreased hospitalizations

## 2017-01-24 NOTE — Patient Instructions (Signed)
We will review report from [place] on your sleep machine. Flu shot recommended

## 2017-01-25 LAB — CUP PACEART INCLINIC DEVICE CHECK
Brady Statistic RV Percent Paced: 99.8 %
Date Time Interrogation Session: 20180907145014
HighPow Impedance: 39 Ohm
Implantable Lead Implant Date: 20070322
Implantable Lead Implant Date: 20070322
Implantable Lead Implant Date: 20070322
Implantable Lead Location: 753858
Implantable Lead Model: 7001
Lead Channel Impedance Value: 300 Ohm
Lead Channel Pacing Threshold Amplitude: 0.75 V
Lead Channel Pacing Threshold Pulse Width: 0.5 ms
Lead Channel Setting Pacing Amplitude: 2 V
Lead Channel Setting Pacing Amplitude: 2.375
Lead Channel Setting Pacing Amplitude: 2.5 V
Lead Channel Setting Pacing Pulse Width: 0.5 ms
Lead Channel Setting Pacing Pulse Width: 0.6 ms
MDC IDC LEAD LOCATION: 753859
MDC IDC LEAD LOCATION: 753860
MDC IDC MSMT BATTERY REMAINING LONGEVITY: 3 mo
MDC IDC MSMT LEADCHNL LV PACING THRESHOLD AMPLITUDE: 0.75 V
MDC IDC MSMT LEADCHNL LV PACING THRESHOLD PULSEWIDTH: 0.6 ms
MDC IDC MSMT LEADCHNL RA IMPEDANCE VALUE: 287.5 Ohm
MDC IDC MSMT LEADCHNL RA PACING THRESHOLD AMPLITUDE: 1 V
MDC IDC MSMT LEADCHNL RA PACING THRESHOLD PULSEWIDTH: 0.5 ms
MDC IDC MSMT LEADCHNL RV IMPEDANCE VALUE: 362.5 Ohm
MDC IDC MSMT LEADCHNL RV SENSING INTR AMPL: 11.7 mV
MDC IDC PG IMPLANT DT: 20120426
MDC IDC PG SERIAL: 631685
MDC IDC SET LEADCHNL RV SENSING SENSITIVITY: 0.5 mV
MDC IDC STAT BRADY RA PERCENT PACED: 99.62 %

## 2017-01-28 ENCOUNTER — Ambulatory Visit: Payer: Medicare Other | Admitting: Family Medicine

## 2017-01-29 ENCOUNTER — Other Ambulatory Visit: Payer: Self-pay

## 2017-01-29 MED ORDER — GABAPENTIN 300 MG PO CAPS: 300.0000 mg | ORAL_CAPSULE | Freq: Every day | ORAL | 0 refills | Status: AC

## 2017-01-29 NOTE — Telephone Encounter (Signed)
Faxed 90d Gabapentin to [facility]/thx dmf

## 2017-01-31 ENCOUNTER — Encounter: Payer: Self-pay | Admitting: Family Medicine

## 2017-01-31 ENCOUNTER — Ambulatory Visit (INDEPENDENT_AMBULATORY_CARE_PROVIDER_SITE_OTHER): Payer: Medicare Other | Admitting: Family Medicine

## 2017-01-31 VITALS — BP 90/58 | HR 70 | Temp 97.8°F | Ht 64.0 in | Wt 232.0 lb

## 2017-01-31 DIAGNOSIS — N184 Chronic kidney disease, stage 4 (severe): Secondary | ICD-10-CM | POA: Diagnosis not present

## 2017-01-31 DIAGNOSIS — I10 Essential (primary) hypertension: Secondary | ICD-10-CM | POA: Diagnosis not present

## 2017-01-31 DIAGNOSIS — G4733 Obstructive sleep apnea (adult) (pediatric): Secondary | ICD-10-CM | POA: Diagnosis not present

## 2017-01-31 DIAGNOSIS — E11319 Type 2 diabetes mellitus with unspecified diabetic retinopathy without macular edema: Secondary | ICD-10-CM | POA: Diagnosis not present

## 2017-01-31 DIAGNOSIS — I255 Ischemic cardiomyopathy: Secondary | ICD-10-CM

## 2017-01-31 DIAGNOSIS — Z794 Long term (current) use of insulin: Secondary | ICD-10-CM

## 2017-01-31 DIAGNOSIS — E039 Hypothyroidism, unspecified: Secondary | ICD-10-CM

## 2017-01-31 DIAGNOSIS — E1122 Type 2 diabetes mellitus with diabetic chronic kidney disease: Secondary | ICD-10-CM | POA: Diagnosis not present

## 2017-01-31 DIAGNOSIS — E559 Vitamin D deficiency, unspecified: Secondary | ICD-10-CM

## 2017-01-31 DIAGNOSIS — Z23 Encounter for immunization: Secondary | ICD-10-CM

## 2017-01-31 DIAGNOSIS — E782 Mixed hyperlipidemia: Secondary | ICD-10-CM

## 2017-01-31 LAB — COMPREHENSIVE METABOLIC PANEL
ALT: 10 U/L (ref 0–35)
AST: 19 U/L (ref 0–37)
Albumin: 3.2 g/dL — ABNORMAL LOW (ref 3.5–5.2)
Alkaline Phosphatase: 78 U/L (ref 39–117)
BUN: 47 mg/dL — AB (ref 6–23)
CALCIUM: 8.8 mg/dL (ref 8.4–10.5)
CO2: 30 meq/L (ref 19–32)
CREATININE: 3.07 mg/dL — AB (ref 0.40–1.20)
Chloride: 102 mEq/L (ref 96–112)
GFR: 15.77 mL/min — ABNORMAL LOW (ref >60.00)
GLUCOSE: 141 mg/dL — AB (ref 70–99)
Potassium: 4.9 mEq/L (ref 3.5–5.1)
SODIUM: 135 meq/L (ref 135–145)
Total Bilirubin: 0.6 mg/dL (ref 0.2–1.2)
Total Protein: 5.7 g/dL — ABNORMAL LOW (ref 6.0–8.3)

## 2017-01-31 LAB — LIPID PANEL
Cholesterol: 122 mg/dL (ref 0–200)
HDL: 25.7 mg/dL — ABNORMAL LOW (ref >39.00)
LDL CALC: 57 mg/dL (ref 0–99)
NONHDL: 96.14
TRIGLYCERIDES: 198 mg/dL — AB (ref 0.0–149.0)
Total CHOL/HDL Ratio: 5
VLDL: 39.6 mg/dL (ref 0.0–40.0)

## 2017-01-31 LAB — CBC WITH DIFFERENTIAL/PLATELET
BASOS PCT: 0.5 % (ref 0.0–3.0)
Basophils Absolute: 0 10*3/uL (ref 0.0–0.1)
EOS PCT: 2.7 % (ref 0.0–5.0)
Eosinophils Absolute: 0.1 10*3/uL (ref 0.0–0.7)
HCT: 32 % — ABNORMAL LOW (ref 36.0–46.0)
HEMOGLOBIN: 10.4 g/dL — AB (ref 12.0–15.0)
Lymphocytes Relative: 20.8 % (ref 12.0–46.0)
Lymphs Abs: 0.7 10*3/uL (ref 0.7–4.0)
MCHC: 32.7 g/dL (ref 30.0–36.0)
MCV: 96.6 fl (ref 78.0–100.0)
MONO ABS: 0.5 10*3/uL (ref 0.1–1.0)
MONOS PCT: 14.5 % — AB (ref 3.0–12.0)
Neutro Abs: 2 10*3/uL (ref 1.4–7.7)
Neutrophils Relative %: 61.5 % (ref 43.0–77.0)
Platelets: 64 10*3/uL — ABNORMAL LOW (ref 150.0–400.0)
RBC: 3.31 Mil/uL — AB (ref 3.87–5.11)
RDW: 16.6 % — AB (ref 11.5–15.5)
WBC: 3.2 10*3/uL — ABNORMAL LOW (ref 4.0–10.5)

## 2017-01-31 LAB — TSH: TSH: 6.83 u[IU]/mL — AB (ref 0.35–4.50)

## 2017-01-31 LAB — HEMOGLOBIN A1C: Hgb A1c MFr Bld: 5 % (ref 4.6–6.5)

## 2017-01-31 NOTE — Assessment & Plan Note (Signed)
hgba1c acceptable, minimize simple carbs. Increase exercise as tolerated. Continue current meds 

## 2017-01-31 NOTE — Patient Instructions (Signed)

## 2017-01-31 NOTE — Assessment & Plan Note (Signed)
Well controlled, no changes to meds. Encouraged heart healthy diet such as the DASH diet and exercise as tolerated.  °

## 2017-01-31 NOTE — Assessment & Plan Note (Signed)
Encouraged heart healthy diet, increase exercise, avoid trans fats, consider a krill oil cap daily 

## 2017-02-01 MED ORDER — LEVOTHYROXINE SODIUM 88 MCG PO TABS: 88.0000 ug | ORAL_TABLET | Freq: Every day | ORAL | 1 refills | Status: AC

## 2017-02-03 NOTE — Assessment & Plan Note (Signed)
stable °

## 2017-02-03 NOTE — Progress Notes (Signed)
[name], female   DOB: May 16, 1940, 76 y.o.   MRN: [id number]   Subjective:    [name], female    DOB: May 11, 1941, 76 y.o.   MRN: [id number]  Chief Complaint  Patient presents with  . Follow-up    Pt states that she's been "shaking and jerking for 2 months" Pt's husbands states that "it's like a nervous shake.   . Diabetes    HPI Patient is in today for follow up and accompanied by her husband. Is due for a pacemaker replacement in December. She feels well today. No recent febrile illness or hospitalization. Denies CP/palp/SOB/HA/congestion/fevers/GI or GU c/o. Taking meds as prescribed  Past Medical History:  Diagnosis Date  . AICD (automatic cardioverter/defibrillator) present   . Anemia, unspecified 12/13/2013  . Back pain, chronic    "just when I walk; mass on 3rd and 4th vertebrae right lower back"  . Biventricular implantable cardiac defibrillator in situ 2007, 2012   a. 2007;  b. 2012 Gen change: SJM 3231-40 Uni BiV ICD, ser # [id number].  Marland Kitchen CAD (coronary artery disease)    a. 05/2001 CABG x4: LIMA->LAD, VG->D1, VG->D2, VG->RCA.  . Cardiomyopathy, ischemic    a. 2012 s/p SJM 3231-40 Uni BiV ICD, ser # [id number].  . Carotid stenosis    a. 10/19/2011 carotid duplex - Mild hard plaque bilaterally. Stable 40-59% bilateral ICA stenosis. Carotid US (04/2013):  Bilateral 40-59% ICA.  F/u 1 year  . Cerumen impaction 10/28/2015  . Chronic combined systolic and diastolic CHF (congestive heart failure) (HCC)    a. EF as low as 25% in 2006;  b. EF 60-65% in 12/2011;  c. 02/2013 Echo: EF 35-40, mod mid-dist antsept HK, Gr 2 DD, mild LVH.  . CKD (chronic kidney disease), stage IV ([place])    hx/notes 09/04/2016  . COPD (chronic obstructive pulmonary disease) ([place])   . COPD, severe ([place]) 01/22/2016  . 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 Dyslipidemia   . Gout ~ 08/2011  . History of vertebral fracture  09/18/2016  . Hypercalcemia 09/26/2013  . Hypertension   . Hypothyroidism   . Hypoxemia requiring supplemental oxygen   . LBBB (left bundle branch block)   . Lumbar compression fracture ([place])    L2/notes 09/04/2016  . Morbid obesity with BMI of 45.0-49.9, adult (HCC)    Ht. 5'4". BMI 47.2  . Mural thrombus of left ventricle    before 2003, while on Coumadin, No h/o CVA  . 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 Benay Spice, in remission  . On home oxygen therapy    "normally 2-3L; 24/7" (12/14/2015); "2L; 24/7" (09/04/2016)  . OSA on CPAP   . PAF (paroxysmal atrial fibrillation) (HCC)    on Coumadin  . Presence of permanent cardiac pacemaker   . Retinopathy due to secondary diabetes ([place])    type II, uncontrolled  . Urinary incontinence 06/25/2016  . Vertebral fracture, osteoporotic ([place]) 08/27/2016  . Vitamin D deficiency 06/09/2012   historical    Past Surgical History:  Procedure Laterality Date  . ABDOMINAL HYSTERECTOMY  1982  . CARDIAC CATHETERIZATION  06/03/01  . CARDIOVERSION N/A 07/30/2014   Procedure: CARDIOVERSION;  Surgeon: Jolaine Artist, MD;  [facility] ENDOSCOPY;  Service: Cardiovascular;  Laterality: N/A;  . CARDIOVERSION N/A 11/25/2014   Procedure: CARDIOVERSION;  Surgeon: Evans Lance, MD;  Location: [facility];  Service: Cardiovascular;  Laterality: N/A;  . CATARACT EXTRACTION     left eye  . CHOLECYSTECTOMY  1970  . CORONARY ARTERY BYPASS GRAFT  2003   CABG X4  . INSERT / REPLACE / REMOVE PACEMAKER  2007; 2012   w/AICD  . IR KYPHO LUMBAR INC FX REDUCE BONE BX UNI/BIL CANNULATION INC/IMAGING  09/11/2016  . REFRACTIVE SURGERY     right eye  . TEE WITHOUT CARDIOVERSION N/A 12/16/2015   Procedure: TRANSESOPHAGEAL ECHOCARDIOGRAM (TEE);  Surgeon: Jolaine Artist, MD;  Location: [facility] ENDOSCOPY;  Service: Cardiovascular;  Laterality: N/A;  . TUBAL LIGATION  1972    Family History  Problem Relation Age of Onset  . Kidney  cancer Mother        kidney and female repo - died @ 81  . Asthma Mother   . Cancer Mother        gyn and renal  . Heart disease Mother        CAD  . Heart disease Father        died @ 50  . Stroke Father   . Diabetes Father   . Hypertension Father   . Hyperlipidemia Father   . Heart attack Father   . Hypertension Son   . Kidney cancer Maternal Uncle   . Cancer Maternal Uncle   . Cirrhosis Maternal Grandmother        non alcohol  . Cancer Maternal Grandfather   . Kidney cancer Maternal Grandfather   . Hypertension Maternal Aunt     Social History   Social History  . Marital status: Married    Spouse name: N/A  . Number of children: Y  . Years of education: N/A   Occupational History  . 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    Social History Main Topics  . Smoking status: Never Smoker  . Smokeless tobacco: Never Used  . Alcohol use No  . Drug use: No  . Sexual activity: Not Currently     Comment: lives with husband, no major dietary restrictions   Other Topics Concern  . Not on file   Social History Narrative   Lives in [place] with her husband.  She does not routinely exercise or adhere to any particular diet.      Outpatient Medications Prior to Visit  Medication Sig Dispense Refill  . acetaminophen (TYLENOL) 500 MG tablet Take 1,000 mg by mouth 2 (two) times daily as needed for mild pain.     Marland Kitchen albuterol (PROVENTIL HFA;VENTOLIN HFA) 108 (90 BASE) MCG/ACT inhaler Inhale 2 puffs into the lungs every 6 (six) hours as needed for wheezing or shortness of breath. 1 Inhaler 1  . amiodarone (PACERONE) 200 MG tablet Take 200 mg by mouth daily.    . carvedilol (COREG) 25 MG tablet Take 1 tablet (25 mg total) by mouth 2 (two) times daily. 360 tablet 3  . dicyclomine (BENTYL) 20 MG tablet Take 1 tablet (20 mg total) by mouth 2 (two) times daily as needed for spasms. 30 tablet 0  . diphenoxylate-atropine (LOMOTIL) 2.5-0.025 MG tablet Take 1 tablet by mouth 4  (four) times daily as needed for diarrhea or loose stools. 30 tablet 0  . escitalopram (LEXAPRO) 10 MG tablet TAKE 1 TABLET EVERY DAY 90 tablet 1  . fluticasone (FLONASE) 50 MCG/ACT nasal spray Place 2 sprays into both nostrils daily as needed for allergies or rhinitis. As needed for nasal stuffiness. (Patient taking differently: Place 2  sprays into both nostrils daily as needed for allergies or rhinitis. ) 16 g 0  . gabapentin (NEURONTIN) 300 MG capsule Take 1 capsule (300 mg total) by mouth at bedtime. 90 capsule 0  . glucose blood (FREESTYLE LITE) test strip DX: 250.60  Check sugars bid and as needed (Patient taking differently: 1 each by Other route See admin instructions. Check blood sugar twice daily) 100 each 2  . insulin NPH (HUMULIN N,NOVOLIN N) 100 UNIT/ML injection Inject 7-40 Units into the skin See admin instructions. 40 units in the morning before breakfast then 7 units in the evening before supper/evening meal    . insulin regular (HUMULIN R) 250 units/2.2m (100 units/mL) injection Inject 5 Units into the skin daily before breakfast.    . OXYGEN Inhale 2 L into the lungs continuous.     . torsemide (DEMADEX) 20 MG tablet Take 20-40 mg by mouth See admin instructions. 40 mg in the morning on Sun/Fri/Sat and 20 mg on Mon/Tues/Wed/Thurs    . warfarin (COUMADIN) 5 MG tablet TAKE AS DIRECTED BY ANTICOAGULATION CLINIC. 90 tablet 1  . levothyroxine (SYNTHROID, LEVOTHROID) 25 MCG tablet TAKE 1 TABLET EVERY DAY BEFORE BREAKFAST 90 tablet 1   No facility-administered medications prior to visit.     Allergies  Allergen Reactions  . Sulfonamide Derivatives Other (See Comments)    Reaction unknown; "childhood allergy/mother"    Review of Systems  Constitutional: Positive for malaise/fatigue. Negative for fever.  HENT: Negative for congestion.   Eyes: Negative for blurred vision.  Respiratory: Positive for shortness of breath.   Cardiovascular: Negative for chest pain, palpitations and  leg swelling.  Gastrointestinal: Negative for abdominal pain, blood in stool and nausea.  Genitourinary: Negative for dysuria and frequency.  Musculoskeletal: Negative for falls.  Skin: Negative for rash.  Neurological: Negative for dizziness, loss of consciousness and headaches.  Endo/Heme/Allergies: Negative for environmental allergies.  Psychiatric/Behavioral: Negative for depression. The patient is not nervous/anxious.        Objective:    Physical Exam  Constitutional: She is oriented to person, place, and time. She appears well-developed and well-nourished. No distress.  HENT:  Head: Normocephalic and atraumatic.  Nose: Nose normal.  Eyes: Right eye exhibits no discharge. Left eye exhibits no discharge.  Neck: Normal range of motion. Neck supple.  Cardiovascular: Normal rate.   No murmur heard. Pulmonary/Chest: Effort normal and breath sounds normal.  Abdominal: Soft. Bowel sounds are normal. There is no tenderness.  Musculoskeletal: She exhibits no edema.  Neurological: She is alert and oriented to person, place, and time.  Skin: Skin is warm and dry.  Psychiatric: She has a normal mood and affect.  Nursing note and vitals reviewed.   BP (!) 90/58   Pulse 70   Temp 97.8 F (36.6 C) (Oral)   Ht 5' 4" (1.626 m)   Wt 232 lb (105.2 kg)   SpO2 96%   BMI 39.82 kg/m  Wt Readings from Last 3 Encounters:  01/31/17 232 lb (105.2 kg)  01/24/17 232 lb (105.2 kg)  01/18/17 232 lb 12.8 oz (105.6 kg)     Lab Results  Component Value Date   WBC 3.2 (L) 01/31/2017   HGB 10.4 (L) 01/31/2017   HCT 32.0 (L) 01/31/2017   PLT 64.0 (L) 01/31/2017   GLUCOSE 141 (H) 01/31/2017   CHOL 122 01/31/2017   TRIG 198.0 (H) 01/31/2017   HDL 25.70 (L) 01/31/2017   LDLDIRECT 93.0 08/27/2016   LDLCALC 57 01/31/2017   ALT  10 01/31/2017   AST 19 01/31/2017   NA 135 01/31/2017   K 4.9 01/31/2017   CL 102 01/31/2017   CREATININE 3.07 (H) 01/31/2017   BUN 47 (H) 01/31/2017   CO2 30  01/31/2017   TSH 6.83 (H) 01/31/2017   INR 2.4 01/18/2017   HGBA1C 5.0 01/31/2017   MICROALBUR 77.0 (H) 03/28/2011    Lab Results  Component Value Date   TSH 6.83 (H) 01/31/2017   Lab Results  Component Value Date   WBC 3.2 (L) 01/31/2017   HGB 10.4 (L) 01/31/2017   HCT 32.0 (L) 01/31/2017   MCV 96.6 01/31/2017   PLT 64.0 (L) 01/31/2017   Lab Results  Component Value Date   NA 135 01/31/2017   K 4.9 01/31/2017   CHLORIDE 100 10/15/2016   CO2 30 01/31/2017   GLUCOSE 141 (H) 01/31/2017   BUN 47 (H) 01/31/2017   CREATININE 3.07 (H) 01/31/2017   BILITOT 0.6 01/31/2017   ALKPHOS 78 01/31/2017   AST 19 01/31/2017   ALT 10 01/31/2017   PROT 5.7 (L) 01/31/2017   ALBUMIN 3.2 (L) 01/31/2017   CALCIUM 8.8 01/31/2017   ANIONGAP 8 10/15/2016   EGFR 20 (L) 10/15/2016   GFR 15.77 (L) 01/31/2017   Lab Results  Component Value Date   CHOL 122 01/31/2017   Lab Results  Component Value Date   HDL 25.70 (L) 01/31/2017   Lab Results  Component Value Date   LDLCALC 57 01/31/2017   Lab Results  Component Value Date   TRIG 198.0 (H) 01/31/2017   Lab Results  Component Value Date   CHOLHDL 5 01/31/2017   Lab Results  Component Value Date   HGBA1C 5.0 01/31/2017       Assessment & Plan:   Problem List Items Addressed This Visit    Controlled type 2 diabetes mellitus with chronic kidney disease ([place]) (Chronic)    hgba1c acceptable, minimize simple carbs. Increase exercise as tolerated. Continue current meds      Diabetes mellitus type 2 with retinopathy ([place])    hgba1c acceptable, minimize simple carbs. Increase exercise as tolerated. Continue current meds      Relevant Orders   Hemoglobin A1c (Completed)   Hyperlipidemia    Encouraged heart healthy diet, increase exercise, avoid trans fats, consider a krill oil cap daily      Relevant Orders   Lipid panel (Completed)   Essential hypertension    Well controlled, no changes to meds. Encouraged heart healthy  diet such as the DASH diet and exercise as tolerated.       Relevant Orders   CBC with Differential/Platelet (Completed)   Comprehensive metabolic panel (Completed)   TSH (Completed)   OSA (obstructive sleep apnea)    continues to do well on noninvasive ventilation.      Vitamin D deficiency    Continue supplements      Hypothyroidism    On Levothyroxine, continue to monitor      Relevant Medications   levothyroxine (SYNTHROID, LEVOTHROID) 88 MCG tablet   CKD (chronic kidney disease) stage 4, GFR 15-29 ml/min (HCC)    stable       Other Visit Diagnoses    Need for immunization against influenza    -  Primary   Relevant Orders   Flu vaccine HIGH DOSE PF (Fluzone High dose)      I have discontinued Ms. Hoar's levothyroxine. I am also having her start on levothyroxine. Additionally, I am having her maintain her  albuterol, insulin NPH Human, glucose blood, fluticasone, acetaminophen, OXYGEN, amiodarone, torsemide, carvedilol, insulin regular, dicyclomine, diphenoxylate-atropine, warfarin, escitalopram, and gabapentin.  Meds ordered this encounter  Medications  . levothyroxine (SYNTHROID, LEVOTHROID) 88 MCG tablet    Sig: Take 1 tablet (88 mcg total) by mouth daily.    Dispense:  90 tablet    Refill:  1    CMA served as Education administrator during this visit. History, Physical and Plan performed by medical provider. Documentation and orders reviewed and attested to.  Penni Homans, MD

## 2017-02-03 NOTE — Assessment & Plan Note (Signed)
Continue supplements

## 2017-02-03 NOTE — Assessment & Plan Note (Signed)
continues to do well on noninvasive ventilation.

## 2017-02-03 NOTE — Assessment & Plan Note (Signed)
On Levothyroxine, continue to monitor 

## 2017-02-04 ENCOUNTER — Ambulatory Visit (INDEPENDENT_AMBULATORY_CARE_PROVIDER_SITE_OTHER): Payer: Medicare Other | Admitting: *Deleted

## 2017-02-04 DIAGNOSIS — Z5181 Encounter for therapeutic drug level monitoring: Secondary | ICD-10-CM | POA: Diagnosis not present

## 2017-02-04 DIAGNOSIS — Z8673 Personal history of transient ischemic attack (TIA), and cerebral infarction without residual deficits: Secondary | ICD-10-CM | POA: Diagnosis not present

## 2017-02-04 DIAGNOSIS — I480 Paroxysmal atrial fibrillation: Secondary | ICD-10-CM | POA: Diagnosis not present

## 2017-02-04 LAB — POCT INR: INR: 3.5

## 2017-02-05 LAB — CUP PACEART REMOTE DEVICE CHECK
Implantable Lead Implant Date: 20070322
Implantable Lead Location: 753858
MDC IDC LEAD IMPLANT DT: 20070322
MDC IDC LEAD IMPLANT DT: 20070322
MDC IDC LEAD LOCATION: 753859
MDC IDC LEAD LOCATION: 753860
MDC IDC PG IMPLANT DT: 20120426
MDC IDC PG SERIAL: 631685
MDC IDC SESS DTM: 20180925093029

## 2017-02-15 ENCOUNTER — Ambulatory Visit (HOSPITAL_COMMUNITY)
Admission: RE | Admit: 2017-02-15 | Discharge: 2017-02-15 | Disposition: A | Discharge status: Home / Self Care | Payer: Medicare Other | Source: Ambulatory Visit | Attending: Internal Medicine | Admitting: Internal Medicine

## 2017-02-15 ENCOUNTER — Encounter (HOSPITAL_COMMUNITY): Payer: Self-pay | Admitting: Internal Medicine

## 2017-02-15 VITALS — BP 116/58 | HR 74 | Wt 236.0 lb

## 2017-02-15 DIAGNOSIS — Z66 Do not resuscitate: Secondary | ICD-10-CM | POA: Insufficient documentation

## 2017-02-15 DIAGNOSIS — Z950 Presence of cardiac pacemaker: Secondary | ICD-10-CM | POA: Insufficient documentation

## 2017-02-15 DIAGNOSIS — Z7901 Long term (current) use of anticoagulants: Secondary | ICD-10-CM | POA: Insufficient documentation

## 2017-02-15 DIAGNOSIS — Z794 Long term (current) use of insulin: Secondary | ICD-10-CM | POA: Diagnosis not present

## 2017-02-15 DIAGNOSIS — Z951 Presence of aortocoronary bypass graft: Secondary | ICD-10-CM | POA: Diagnosis not present

## 2017-02-15 DIAGNOSIS — Z79899 Other long term (current) drug therapy: Secondary | ICD-10-CM | POA: Insufficient documentation

## 2017-02-15 DIAGNOSIS — I2510 Atherosclerotic heart disease of native coronary artery without angina pectoris: Secondary | ICD-10-CM | POA: Diagnosis not present

## 2017-02-15 DIAGNOSIS — Z8572 Personal history of non-Hodgkin lymphomas: Secondary | ICD-10-CM | POA: Insufficient documentation

## 2017-02-15 DIAGNOSIS — M109 Gout, unspecified: Secondary | ICD-10-CM | POA: Diagnosis not present

## 2017-02-15 DIAGNOSIS — I130 Hypertensive heart and chronic kidney disease with heart failure and stage 1 through stage 4 chronic kidney disease, or unspecified chronic kidney disease: Secondary | ICD-10-CM | POA: Insufficient documentation

## 2017-02-15 DIAGNOSIS — I5022 Chronic systolic (congestive) heart failure: Secondary | ICD-10-CM

## 2017-02-15 DIAGNOSIS — Z9889 Other specified postprocedural states: Secondary | ICD-10-CM | POA: Insufficient documentation

## 2017-02-15 DIAGNOSIS — I480 Paroxysmal atrial fibrillation: Secondary | ICD-10-CM | POA: Diagnosis not present

## 2017-02-15 DIAGNOSIS — M6282 Rhabdomyolysis: Secondary | ICD-10-CM | POA: Diagnosis not present

## 2017-02-15 DIAGNOSIS — E1122 Type 2 diabetes mellitus with diabetic chronic kidney disease: Secondary | ICD-10-CM | POA: Insufficient documentation

## 2017-02-15 DIAGNOSIS — N179 Acute kidney failure, unspecified: Secondary | ICD-10-CM | POA: Insufficient documentation

## 2017-02-15 DIAGNOSIS — G8929 Other chronic pain: Secondary | ICD-10-CM | POA: Insufficient documentation

## 2017-02-15 DIAGNOSIS — J449 Chronic obstructive pulmonary disease, unspecified: Secondary | ICD-10-CM | POA: Insufficient documentation

## 2017-02-15 DIAGNOSIS — G4733 Obstructive sleep apnea (adult) (pediatric): Secondary | ICD-10-CM | POA: Diagnosis not present

## 2017-02-15 DIAGNOSIS — N184 Chronic kidney disease, stage 4 (severe): Secondary | ICD-10-CM | POA: Insufficient documentation

## 2017-02-15 DIAGNOSIS — E559 Vitamin D deficiency, unspecified: Secondary | ICD-10-CM | POA: Insufficient documentation

## 2017-02-15 DIAGNOSIS — I6523 Occlusion and stenosis of bilateral carotid arteries: Secondary | ICD-10-CM | POA: Diagnosis not present

## 2017-02-15 DIAGNOSIS — I255 Ischemic cardiomyopathy: Secondary | ICD-10-CM | POA: Diagnosis not present

## 2017-02-15 DIAGNOSIS — E785 Hyperlipidemia, unspecified: Secondary | ICD-10-CM | POA: Insufficient documentation

## 2017-02-15 DIAGNOSIS — E11649 Type 2 diabetes mellitus with hypoglycemia without coma: Secondary | ICD-10-CM | POA: Diagnosis not present

## 2017-02-15 DIAGNOSIS — I340 Nonrheumatic mitral (valve) insufficiency: Secondary | ICD-10-CM | POA: Diagnosis not present

## 2017-02-15 DIAGNOSIS — I5042 Chronic combined systolic (congestive) and diastolic (congestive) heart failure: Secondary | ICD-10-CM | POA: Diagnosis not present

## 2017-02-15 DIAGNOSIS — Z6841 Body Mass Index (BMI) 40.0 and over, adult: Secondary | ICD-10-CM | POA: Diagnosis not present

## 2017-02-15 MED ORDER — METOLAZONE 2.5 MG PO TABS: 2.5000 mg | ORAL_TABLET | Freq: Once | ORAL | 0 refills | Status: DC

## 2017-02-15 MED ORDER — ATORVASTATIN CALCIUM 10 MG PO TABS: 10.0000 mg | ORAL_TABLET | Freq: Every day | ORAL | 3 refills | Status: DC

## 2017-02-15 MED ORDER — CARVEDILOL 12.5 MG PO TABS: 12.5000 mg | ORAL_TABLET | Freq: Two times a day (BID) | ORAL | 3 refills | Status: DC

## 2017-02-15 NOTE — Progress Notes (Signed)
[name], female   DOB: 1941/05/13, 76 y.o.   MRN: [id number]    Advanced Heart Failure Clinic Note   PCP: Dr Charlett Blake Nephrologist: Dr Marval Regal EP: Dr Lovena Le  Pulmonary: Dr Gwenette Greet  HPI: [name] is a 76 y.o. female with a history of CAD, status post CABG in 2003, ischemic cardiomyopathy with variable ejection fractions noted in the past (echo in 12/2011 demonstrated normal LV function with an EF 60-65%), status post CRT-D, chronic combined systolic and diastolic CHF, paroxysmal atrial fibrillation, prior LV mural thrombus, on chronic Coumadin therapy, CKD, T2DM. She has a prior admission to the hospital for acute renal failure in the setting of rhabdomyolysis (while taking colchicine and fenofibrate). Carotid US (12/14): Bilateral ICA stenosis 40-59%.    She was admitted 10/23-11/11/14 with acute on chronic combined systolic and diastolic CHF. ECHO with worsening LV function with an EF of 35-40%. Hospital stay was complicated by a/c renal failure and respiratory failure. Had short term intubation. She was made a DNR/DNI.  Discharge weight 256 lbs. She is on home oxygen 2L by nasal cannula and CPAP at night, OHS/OSA.   Admitted 12/14/15 with AKI and hypoglycemia.  ICD interrogation showed no VT but impedance down suggesting fluid overload. She was thought to be dry on exam, especially with AKI.  Limited echo 12/15/15 with probable EF 40-45%, but concerning for mobile density attached to mitral valve.     TEE 12/16/15 showed EF 40% with RV dysfunction. Small ruptured MV chord and only mild MVR.   Overall feeling well. Creatinine has stabilized between 2.5-3.0 Following with Dr. Marval Regal. Ankle swelling improved. Taking torsemide 20 mg daily and 40mg  F, Sat and Sun. Gets very fatigued by Sunday. No orthopnea or PND. Weight climbing slowly from 230-236. Wearing new non-invasive ventilator machine at night and CO2 has come down nicely.   Corvue-  Volume up. No VT. No AF Personally  reviewed   ECHO (03/06/13): EF 35-40% ECHO (09/07/13): EF 40%, grade II diastolic dysfunction, mildly decreased RV systolic function with normal RV size. ECHO 10/2014:  EF ~30%. Floating MV chord - which is stable from previous echo. Carotid dopplers (1/16) with 40-59% bilateral ICA stenosis.  TEE 12/16/15 showed EF 40% with RV dysfunction. Small ruptured MV chord and only mild MVR.   Labs  05/01/13 K 5.4 Creatinine 1.46 Pro BNP 646  05/19/13 K 5.0 Creatinine 1.38 3/15 LDL 41, HDL 38, creatinine 1.7 4/15 K 5.2, creatinine 1.8 09/16/13 K 4.6 Creatinine 1.6 10/15 K 5.2, creatinine 1.6, LDL 71, HDL 33, TGs 425, TSH normal 12/15 LDL 65, TGs 337 03/12/15: K 4.2 Creatinine 2.70  ROS: All systems negative except as listed in HPI, PMH and Problem List.  Past Medical History:  Diagnosis Date  . AICD (automatic cardioverter/defibrillator) present   . Anemia, unspecified 12/13/2013  . Back pain, chronic    "just when I walk; mass on 3rd and 4th vertebrae right lower back"  . Biventricular implantable cardiac defibrillator in situ 2007, 2012   a. 2007;  b. 2012 Gen change: SJM 3231-40 Uni BiV ICD, ser # [id number].  Marland Kitchen CAD (coronary artery disease)    a. 05/2001 CABG x4: LIMA->LAD, VG->D1, VG->D2, VG->RCA.  . Cardiomyopathy, ischemic    a. 2012 s/p SJM 3231-40 Uni BiV ICD, ser # [id number].  . Carotid stenosis    a. 10/19/2011 carotid duplex - Mild hard plaque bilaterally. Stable 40-59% bilateral ICA stenosis. Carotid US (04/2013):  Bilateral 40-59% ICA.  F/u 1  year  . Cerumen impaction 10/28/2015  . Chronic combined systolic and diastolic CHF (congestive heart failure) (HCC)    a. EF as low as 25% in 2006;  b. EF 60-65% in 12/2011;  c. 02/2013 Echo: EF 35-40, mod mid-dist antsept HK, Gr 2 DD, mild LVH.  . CKD (chronic kidney disease), stage IV ([place])    hx/notes 09/04/2016  . COPD (chronic obstructive pulmonary disease) ([place])   . COPD, severe ([place]) 01/22/2016  . DM (diabetes mellitus), type 2 with complications  (HCC)    insulin dependent, retinopathy, neuropathy  . Dyslipidemia   . Gout ~ 08/2011  . History of vertebral fracture 09/18/2016  . Hypercalcemia 09/26/2013  . Hypertension   . Hypothyroidism   . Hypoxemia requiring supplemental oxygen   . LBBB (left bundle branch block)   . Lumbar compression fracture ([place])    L2/notes 09/04/2016  . Morbid obesity with BMI of 45.0-49.9, adult (HCC)    Ht. 5'4". BMI 47.2  . Mural thrombus of left ventricle    before 2003, while on Coumadin, No h/o CVA  . 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 Benay Spice, in remission  . On home oxygen therapy    "normally 2-3L; 24/7" (12/14/2015); "2L; 24/7" (09/04/2016)  . OSA on CPAP   . PAF (paroxysmal atrial fibrillation) (HCC)    on Coumadin  . Presence of permanent cardiac pacemaker   . Retinopathy due to secondary diabetes ([place])    type II, uncontrolled  . Urinary incontinence 06/25/2016  . Vertebral fracture, osteoporotic ([place]) 08/27/2016  . Vitamin D deficiency 06/09/2012   historical    Current Outpatient Prescriptions  Medication Sig Dispense Refill  . acetaminophen (TYLENOL) 500 MG tablet Take 1,000 mg by mouth 2 (two) times daily as needed for mild pain.     Marland Kitchen albuterol (PROVENTIL HFA;VENTOLIN HFA) 108 (90 BASE) MCG/ACT inhaler Inhale 2 puffs into the lungs every 6 (six) hours as needed for wheezing or shortness of breath. 1 Inhaler 1  . amiodarone (PACERONE) 200 MG tablet Take 200 mg by mouth daily.    . carvedilol (COREG) 25 MG tablet Take 1 tablet (25 mg total) by mouth 2 (two) times daily. 360 tablet 3  . dicyclomine (BENTYL) 20 MG tablet Take 1 tablet (20 mg total) by mouth 2 (two) times daily as needed for spasms. 30 tablet 0  . diphenoxylate-atropine (LOMOTIL) 2.5-0.025 MG tablet Take 1 tablet by mouth 4 (four) times daily as needed for diarrhea or loose stools. 30 tablet 0  . escitalopram (LEXAPRO) 10 MG tablet TAKE 1 TABLET EVERY DAY 90 tablet 1  .  fluticasone (FLONASE) 50 MCG/ACT nasal spray Place 2 sprays into both nostrils daily as needed for allergies or rhinitis. As needed for nasal stuffiness. (Patient taking differently: Place 2 sprays into both nostrils daily as needed for allergies or rhinitis. ) 16 g 0  . gabapentin (NEURONTIN) 300 MG capsule Take 1 capsule (300 mg total) by mouth at bedtime. 90 capsule 0  . glucose blood (FREESTYLE LITE) test strip DX: 250.60  Check sugars bid and as needed (Patient taking differently: 1 each by Other route See admin instructions. Check blood sugar twice daily) 100 each 2  . insulin NPH (HUMULIN N,NOVOLIN N) 100 UNIT/ML injection Inject 7-40 Units into the skin See admin instructions. 40 units in the morning before breakfast then 7 units in the evening before supper/evening meal    . insulin regular (HUMULIN R) 250 units/2.72mL (  100 units/mL) injection Inject 5 Units into the skin daily before breakfast.    . levothyroxine (SYNTHROID, LEVOTHROID) 88 MCG tablet Take 1 tablet (88 mcg total) by mouth daily. 90 tablet 1  . levothyroxine (SYNTHROID, LEVOTHROID) 88 MCG tablet Take 88 mcg by mouth daily before breakfast.    . OXYGEN Inhale 2 L into the lungs continuous.     . torsemide (DEMADEX) 20 MG tablet Take 20-40 mg by mouth See admin instructions. 40 mg in the morning on Sun/Fri/Sat and 20 mg on Mon/Tues/Wed/Thurs    . warfarin (COUMADIN) 5 MG tablet TAKE AS DIRECTED BY ANTICOAGULATION CLINIC. 90 tablet 1   No current facility-administered medications for this encounter.     Vitals:   02/15/17 1101  BP: (!) 116/58  Pulse: 74  SpO2: 100%  Weight: 236 lb (107 kg)   Wt Readings from Last 3 Encounters:  02/15/17 236 lb (107 kg)  01/31/17 232 lb (105.2 kg)  01/24/17 232 lb (105.2 kg)    PHYSICAL EXAM: General:  Obese, Elderly appearing, NAD;  Husband present. Arrived in scooter. On O2 HEENT: normal Neck: supple.JVP to jaw  Carotids 2+ bilat; no bruits. No lymphadenopathy or thryomegaly  appreciated. Cor: PMI nondisplaced. Regular rate & rhythm.2/^ SEM at RUBS  Lungs: mild basilar crackles  Abdomen: obese soft, nontender, nondistended. No hepatosplenomegaly. No bruits or masses. Good bowel sounds. Extremities: no cyanosis, clubbing, rash, 1-2+ edema Neuro: alert & orientedx3, cranial nerves grossly intact. moves all 4 extremities w/o difficulty. Affect pleasant   ASSESSMENT & PLAN:  1) Chronic combined diastolic/systolic HF: Ischemic cardiomyopathy s/p CRT-D ([employer]). TEE 12/16/15 with EF 40%, RV dysfunction, and floating MV cord similar to previous Echos.  - Ongoing NYHA III symptoms. Volume up on exam and Corevue (interrogated personally) . - Continue current torsemide dose for now. Add metolazone 2.5 as needed for weight >= 235. May need to increase torsemide slightly as well - Will cut carvedilol back to 12.5 bid to maximize renal perfusion - No spironolactone, ACE/ARB/ABNI with A/CKD - Reinforced the need and importance of daily weights, a low sodium diet, and fluid restriction (less than 2 L a day). Instructed to call the HF clinic if weight increases more than 3 lbs overnight or 5 lbs in a week. 2) PAF: S/P DC-CV 07/28/14.  - No AF by corevue today  - Continue BB and coumadin. - No bleeding on coumadin. Coumadin clinic follows.  3) OHS/OSA - Continue nightly noninvasive ventilation and chronic home oxygen. This has helped tremendously.  4) CAD: s/p CABG.   - No s/s ischemia. No ASA with stable CAD and coumadin use. - Restart atorva at 10mg  daily  5) Hyperlipidemia:  - Will restart atorva 10 - Further management of TGs per PCP. Can refer to lipid clinic as needed.  6) Carotid stenosis:  - Bilateral 1-39% stenosis - Repeat as needed 7) CKD Stage III-IV:  - Creatinine has stabilized 2.5-3.0 range. Follows with Dr. Marval Regal.  Bensimhon, Daniel,MD 11:13 AM

## 2017-02-15 NOTE — Patient Instructions (Addendum)
Decrease Carvedilol 12.5 mg (1 tab) daily  Metolozone 2.5 (1 tab) daily  Atorvastatin 10 mg (1 tab) every evening   Your physician has requested that you have an echocardiogram. Echocardiography is a painless test that uses sound waves to create images of your heart. It provides your doctor with information about the size and shape of your heart and how well your heart's chambers and valves are working. This procedure takes approximately one hour. There are no restrictions for this procedure.   Your physician recommends that you schedule a follow-up appointment in: 4 months ( we will call you)

## 2017-02-19 ENCOUNTER — Ambulatory Visit (INDEPENDENT_AMBULATORY_CARE_PROVIDER_SITE_OTHER): Payer: Medicare Other

## 2017-02-19 ENCOUNTER — Telehealth: Payer: Self-pay

## 2017-02-19 DIAGNOSIS — I5022 Chronic systolic (congestive) heart failure: Secondary | ICD-10-CM

## 2017-02-19 DIAGNOSIS — Z95810 Presence of automatic (implantable) cardiac defibrillator: Secondary | ICD-10-CM | POA: Diagnosis not present

## 2017-02-19 DIAGNOSIS — N181 Chronic kidney disease, stage 1: Secondary | ICD-10-CM | POA: Diagnosis not present

## 2017-02-19 DIAGNOSIS — D631 Anemia in chronic kidney disease: Secondary | ICD-10-CM | POA: Diagnosis not present

## 2017-02-19 NOTE — Telephone Encounter (Signed)
Remote ICM transmission received.  Attempted call to patient and left message to return call. 

## 2017-02-19 NOTE — Progress Notes (Signed)
EPIC Encounter for ICM Monitoring  Patient Name: Destiny [name] is a 76 y.o. female Date: 02/19/2017 Primary Care Physican: Mosie Lukes, MD Primary Cardiologist: [place] Electrophysiologist: Lovena Le Nephrologist: [place] Dry Weight: Previous ICM weight 233lb (10/5 office weight 236 lbs) Bi-V Pacing: >99% Battery Longevity: <75month      ,Attempted call to patient and unable to reach.  Left message to return call.  Transmission reviewed.    Thoracic impedance abnormal suggesting fluid accumulation which was addressed by Dr BHaroldine Lawsat office visit on 10/5 by prescribing 1 dose of Metolazone.   Prescribed dosage: Torsemide 20-40 mg by mouth as directed, 2 tablets daily on Friday, Saturday &Sunday, 1 tablet daily Monday through Thursday.   Dr Bensimhon's office note 10/5 says adding metolazone 2.5 as needed for weight >= 235 but in medication list it is only a 1 time dose.  Labs: 01/31/2017 Creatinine 3.07, BUN 47,    Potassium 4.9, Sodium 135 10/15/2016 Creatinine 2.4, BUN 31.2,Potassium 4.1, Sodium 135 09/28/2016 Creatinine 2.19, BUN 20, Potassium 4.4, Sodium 137, EGFR 21-24 09/10/2016 Creatinine 2.32, BUN 31, Potassium 4.9, Sodium 135, EGFR 20-23 09/05/2016 Creatinine 3.02, BUN 35, Potassium 4.2, Sodium 138, EGFR 14-17 09/04/2016 Creatinine 2.75, BUN 36, Potassium 3.7, Sodium 136, EGFR 16-19  08/27/2016 Creatinine 3.02, BUN 42, Potassium 4.7, Sodium 134  08/20/2016 Creatinine 3.1, BUN 44.6, Potassium 4.4, Sodium 139  07/30/2016 Creatinine 3.4, BUN 44, Potassium 4.2, Sodium 137 05/21/2016 Creatinine 4.3, BUN 58, Potassium 4.4, Sodium 134 04/27/2016 Creatinine 3.73, BUN 44, Potassium 4.8, Sodium 137, EGFR 12.62  Recommendations: NONE - Unable to reach patient   Follow-up plan: ICM clinic phone appointment on 03/05/2017.    Copy of ICM check sent to Dr. TLovena Leand Dr. BHaroldine Laws  3 month ICM trend: 02/19/2017   1 Year ICM trend:       LRosalene Billings RN 02/19/2017 7:59 AM

## 2017-02-22 NOTE — Progress Notes (Addendum)
Spoke with patient.  She stated she is feeling better after taking the 1 time dose of Metolazone.  Weight today is 234 lbs which is up 1 lb from yesterday.  Leg swell during the day but go down at night.  She said she thought Dr Haroldine Laws was going to prescribe the Metolazone for home use as needed but the pharmacy only gave them a 1 time dose.  Advised will send a message to Dr Bensimhon's office to clarify if she should have a Metolazone prescription for as needed.  She prefers the [employer] at [facility].  Per Dr Bensimhon's office note 10/5 it reads that he was adding metolazone 2.5 as needed for weight >= 235 but in medication list it is a 1 time dose.

## 2017-02-26 ENCOUNTER — Telehealth (HOSPITAL_COMMUNITY): Payer: Self-pay | Admitting: *Deleted

## 2017-02-26 ENCOUNTER — Other Ambulatory Visit (HOSPITAL_COMMUNITY): Payer: Self-pay

## 2017-02-26 MED ORDER — METOLAZONE 2.5 MG PO TABS: 2.5000 mg | ORAL_TABLET | ORAL | 3 refills | Status: DC | PRN

## 2017-02-26 NOTE — Telephone Encounter (Signed)
Pt was only give 1 tab of Metolazone and is supposed to take it as needed, needs new rx sent in, med sent

## 2017-02-27 ENCOUNTER — Telehealth: Payer: Self-pay | Admitting: *Deleted

## 2017-02-27 ENCOUNTER — Ambulatory Visit (INDEPENDENT_AMBULATORY_CARE_PROVIDER_SITE_OTHER): Payer: Medicare Other

## 2017-02-27 ENCOUNTER — Ambulatory Visit (HOSPITAL_COMMUNITY)
Admission: RE | Admit: 2017-02-27 | Discharge: 2017-02-27 | Disposition: A | Discharge status: Home / Self Care | Payer: Medicare Other | Source: Ambulatory Visit | Attending: Nephrology | Admitting: Nephrology

## 2017-02-27 DIAGNOSIS — Z5181 Encounter for therapeutic drug level monitoring: Secondary | ICD-10-CM

## 2017-02-27 DIAGNOSIS — Z8673 Personal history of transient ischemic attack (TIA), and cerebral infarction without residual deficits: Secondary | ICD-10-CM | POA: Diagnosis not present

## 2017-02-27 DIAGNOSIS — I480 Paroxysmal atrial fibrillation: Secondary | ICD-10-CM

## 2017-02-27 DIAGNOSIS — N184 Chronic kidney disease, stage 4 (severe): Secondary | ICD-10-CM | POA: Diagnosis not present

## 2017-02-27 DIAGNOSIS — D631 Anemia in chronic kidney disease: Secondary | ICD-10-CM | POA: Diagnosis not present

## 2017-02-27 LAB — POCT HEMOGLOBIN-HEMACUE: Hemoglobin: 9.8 g/dL — ABNORMAL LOW (ref 12.0–15.0)

## 2017-02-27 LAB — POCT INR: INR: 6.8

## 2017-02-27 LAB — PROTIME-INR
INR: 5.7 — AB (ref 0.8–1.2)
PROTHROMBIN TIME: 60 s — AB (ref 9.1–12.0)

## 2017-02-27 MED ORDER — DARBEPOETIN ALFA 60 MCG/0.3ML IJ SOSY
PREFILLED_SYRINGE | INTRAMUSCULAR | Status: AC
  Administered 2017-02-27: 60 ug via SUBCUTANEOUS
  Filled 2017-02-27: qty 0.3

## 2017-02-27 MED ORDER — SODIUM CHLORIDE 0.9 % IV SOLN
510.0000 mg | Freq: Once | INTRAVENOUS | Status: AC
  Administered 2017-02-27: 510 mg via INTRAVENOUS
  Filled 2017-02-27: qty 17

## 2017-02-27 MED ORDER — DARBEPOETIN ALFA 60 MCG/0.3ML IJ SOSY
60.0000 ug | PREFILLED_SYRINGE | INTRAMUSCULAR | Status: DC
  Administered 2017-02-27: 60 ug via SUBCUTANEOUS

## 2017-02-27 NOTE — Telephone Encounter (Signed)
ICD remote transmission- alert for ERI.  LMOM to return call to [facility].

## 2017-02-27 NOTE — Telephone Encounter (Signed)
Destiny [name] made aware CRTD is ERI 02/21/17. She is aware someone will call to arrange f/u to discuss generator replacement. Scheduling aware.

## 2017-03-05 ENCOUNTER — Ambulatory Visit (INDEPENDENT_AMBULATORY_CARE_PROVIDER_SITE_OTHER): Payer: Self-pay

## 2017-03-05 DIAGNOSIS — I5022 Chronic systolic (congestive) heart failure: Secondary | ICD-10-CM

## 2017-03-05 DIAGNOSIS — Z95810 Presence of automatic (implantable) cardiac defibrillator: Secondary | ICD-10-CM

## 2017-03-05 NOTE — Progress Notes (Signed)
EPIC Encounter for ICM Monitoring  Patient Name: Destiny [name] is a 76 y.o. female Date: 03/05/2017 Primary Care Physican: Mosie Lukes, MD Primary Cardiologist: [place] Electrophysiologist: Lovena Le Nephrologist: [place] Dry Weight:  Last weight 234lb  Bi-V Pacing: >99% Battery Longevity: Reached ERI 02/21/2017       Spoke with husband.  Heart Failure questions reviewed, pt asymptomatic.   Thoracic impedance normal.  Prescribed dosage: Torsemide 20-40 mg by mouth as directed, 2 tablets daily on Friday, Saturday &Sunday, 1 tablet daily Monday through Thursday.   Metolazone 2.5 1 tablet as needed for weight >= 235 lbs  Labs: 01/31/2017 Creatinine 3.07, BUN 47,    Potassium 4.9, Sodium 135 10/15/2016 Creatinine 2.4, BUN 31.2,Potassium 4.1, Sodium 135 09/28/2016 Creatinine 2.19, BUN 20, Potassium 4.4, Sodium 137, EGFR 21-24 09/10/2016 Creatinine 2.32, BUN 31, Potassium 4.9, Sodium 135, EGFR 20-23 09/05/2016 Creatinine 3.02, BUN 35, Potassium 4.2, Sodium 138, EGFR 14-17 09/04/2016 Creatinine 2.75, BUN 36, Potassium 3.7, Sodium 136, EGFR 16-19  08/27/2016 Creatinine 3.02, BUN 42, Potassium 4.7, Sodium 134  08/20/2016 Creatinine 3.1, BUN 44.6, Potassium 4.4, Sodium 139  07/30/2016 Creatinine 3.4, BUN 44, Potassium 4.2, Sodium 137 05/21/2016 Creatinine 4.3, BUN 58, Potassium 4.4, Sodium 134 04/27/2016 Creatinine 3.73, BUN 44, Potassium 4.8, Sodium 137, EGFR 12.62  Recommendations: No changes.    Encouraged to call for fluid symptoms.  Follow-up plan: ICM clinic phone appointment on 03/22/2017.  Office appointment scheduled 03/27/2017 with Dr. Lovena Le.  Copy of ICM check sent to Dr. Lovena Le.   3 month ICM trend: 03/05/2017   1 Year ICM trend:      [name], RN 03/05/2017 10:34 AM

## 2017-03-06 ENCOUNTER — Ambulatory Visit (INDEPENDENT_AMBULATORY_CARE_PROVIDER_SITE_OTHER): Payer: Medicare Other | Admitting: Pharmacist

## 2017-03-06 DIAGNOSIS — Z8673 Personal history of transient ischemic attack (TIA), and cerebral infarction without residual deficits: Secondary | ICD-10-CM

## 2017-03-06 DIAGNOSIS — I480 Paroxysmal atrial fibrillation: Secondary | ICD-10-CM

## 2017-03-06 DIAGNOSIS — Z5181 Encounter for therapeutic drug level monitoring: Secondary | ICD-10-CM

## 2017-03-06 LAB — POCT INR: INR: 3.2

## 2017-03-14 ENCOUNTER — Encounter (HOSPITAL_COMMUNITY): Payer: Self-pay | Admitting: Emergency Medicine

## 2017-03-14 ENCOUNTER — Emergency Department (HOSPITAL_COMMUNITY): Payer: Medicare Other

## 2017-03-14 ENCOUNTER — Emergency Department (HOSPITAL_BASED_OUTPATIENT_CLINIC_OR_DEPARTMENT_OTHER): Payer: Medicare Other

## 2017-03-14 ENCOUNTER — Emergency Department (HOSPITAL_COMMUNITY)
Admission: EM | Admit: 2017-03-14 | Discharge: 2017-03-14 | Disposition: A | Discharge status: Home / Self Care | Payer: Medicare Other | Attending: Emergency Medicine | Admitting: Emergency Medicine

## 2017-03-14 DIAGNOSIS — Z8572 Personal history of non-Hodgkin lymphomas: Secondary | ICD-10-CM | POA: Diagnosis not present

## 2017-03-14 DIAGNOSIS — Z79899 Other long term (current) drug therapy: Secondary | ICD-10-CM | POA: Insufficient documentation

## 2017-03-14 DIAGNOSIS — Z951 Presence of aortocoronary bypass graft: Secondary | ICD-10-CM | POA: Diagnosis not present

## 2017-03-14 DIAGNOSIS — M79609 Pain in unspecified limb: Secondary | ICD-10-CM

## 2017-03-14 DIAGNOSIS — E1122 Type 2 diabetes mellitus with diabetic chronic kidney disease: Secondary | ICD-10-CM | POA: Diagnosis not present

## 2017-03-14 DIAGNOSIS — Z950 Presence of cardiac pacemaker: Secondary | ICD-10-CM | POA: Diagnosis not present

## 2017-03-14 DIAGNOSIS — I130 Hypertensive heart and chronic kidney disease with heart failure and stage 1 through stage 4 chronic kidney disease, or unspecified chronic kidney disease: Secondary | ICD-10-CM | POA: Insufficient documentation

## 2017-03-14 DIAGNOSIS — N184 Chronic kidney disease, stage 4 (severe): Secondary | ICD-10-CM | POA: Insufficient documentation

## 2017-03-14 DIAGNOSIS — E039 Hypothyroidism, unspecified: Secondary | ICD-10-CM | POA: Diagnosis not present

## 2017-03-14 DIAGNOSIS — J449 Chronic obstructive pulmonary disease, unspecified: Secondary | ICD-10-CM | POA: Diagnosis not present

## 2017-03-14 DIAGNOSIS — R21 Rash and other nonspecific skin eruption: Secondary | ICD-10-CM | POA: Diagnosis present

## 2017-03-14 DIAGNOSIS — Z7901 Long term (current) use of anticoagulants: Secondary | ICD-10-CM | POA: Diagnosis not present

## 2017-03-14 DIAGNOSIS — R233 Spontaneous ecchymoses: Secondary | ICD-10-CM | POA: Insufficient documentation

## 2017-03-14 DIAGNOSIS — I5042 Chronic combined systolic (congestive) and diastolic (congestive) heart failure: Secondary | ICD-10-CM | POA: Insufficient documentation

## 2017-03-14 DIAGNOSIS — I2510 Atherosclerotic heart disease of native coronary artery without angina pectoris: Secondary | ICD-10-CM | POA: Insufficient documentation

## 2017-03-14 DIAGNOSIS — Z794 Long term (current) use of insulin: Secondary | ICD-10-CM | POA: Insufficient documentation

## 2017-03-14 LAB — I-STAT CG4 LACTIC ACID, ED
Lactic Acid, Venous: 1.36 mmol/L (ref 0.5–1.9)
Lactic Acid, Venous: 1.01 mmol/L (ref 0.5–1.9)

## 2017-03-14 LAB — COMPREHENSIVE METABOLIC PANEL
ALK PHOS: 92 U/L (ref 38–126)
ALT: 18 U/L (ref 14–54)
AST: 33 U/L (ref 15–41)
Albumin: 3 g/dL — ABNORMAL LOW (ref 3.5–5.0)
Anion gap: 11 (ref 5–15)
BILIRUBIN TOTAL: 1.1 mg/dL (ref 0.3–1.2)
BUN: 49 mg/dL — AB (ref 6–20)
CALCIUM: 8.3 mg/dL — AB (ref 8.9–10.3)
CO2: 23 mmol/L (ref 22–32)
CREATININE: 3.42 mg/dL — AB (ref 0.44–1.00)
Chloride: 104 mmol/L (ref 101–111)
GFR calc Af Amer: 14 mL/min — ABNORMAL LOW (ref >60)
GFR, EST NON AFRICAN AMERICAN: 12 mL/min — AB (ref >60)
Glucose, Bld: 115 mg/dL — ABNORMAL HIGH (ref 65–99)
Potassium: 4.4 mmol/L (ref 3.5–5.1)
Sodium: 138 mmol/L (ref 135–145)
TOTAL PROTEIN: 5.4 g/dL — AB (ref 6.5–8.1)

## 2017-03-14 LAB — CBC WITH DIFFERENTIAL/PLATELET
BASOS ABS: 0 10*3/uL (ref 0.0–0.1)
BASOS PCT: 0 %
EOS ABS: 0 10*3/uL (ref 0.0–0.7)
EOS PCT: 0 %
HCT: 35.1 % — ABNORMAL LOW (ref 36.0–46.0)
Hemoglobin: 11 g/dL — ABNORMAL LOW (ref 12.0–15.0)
LYMPHS ABS: 1.1 10*3/uL (ref 0.7–4.0)
Lymphocytes Relative: 15 %
MCH: 30.1 pg (ref 26.0–34.0)
MCHC: 31.3 g/dL (ref 30.0–36.0)
MCV: 95.9 fL (ref 78.0–100.0)
Monocytes Absolute: 1 10*3/uL (ref 0.1–1.0)
Monocytes Relative: 13 %
Neutro Abs: 5.3 10*3/uL (ref 1.7–7.7)
Neutrophils Relative %: 71 %
PLATELETS: 69 10*3/uL — AB (ref 150–400)
RBC: 3.66 MIL/uL — ABNORMAL LOW (ref 3.87–5.11)
RDW: 16 % — ABNORMAL HIGH (ref 11.5–15.5)
WBC: 7.5 10*3/uL (ref 4.0–10.5)

## 2017-03-14 LAB — PROTIME-INR
INR: 2.64
PROTHROMBIN TIME: 28 s — AB (ref 11.4–15.2)

## 2017-03-14 NOTE — ED Provider Notes (Signed)
[facility] EMERGENCY DEPARTMENT Provider Note   CSN: [id number] Arrival date & time: 03/14/17  0957     History   Chief Complaint Chief Complaint  Patient presents with  . Leg Pain    HPI Destiny [name] is a 76 y.o. female.  Patient with history of diabetes with bilateral lower peripheral neuropathy, thrombocytopenia, previous CABG, biventricular cardiac defibrillator implant, chronic kidney disease, COPD, chronic oxygen use, paroxysmal atrial fibrillation on Coumadin and amiodarone-- presents with complaint of rash and leg pain starting yesterday as a red spot behind the calf and then spreading up to the knee.  Patient has multiple other medical problems which are at baseline.  She denies any fevers or vomiting.  Patient with shortness of breath unchanged.  She denies bleeding from the gums, other significant bruising, blood in the urine, blood in the stool.  Patient states she was recently started on a statin. The onset of this condition was acute. The course is constant. Aggravating factors: none. Alleviating factors: none.        Past Medical History:  Diagnosis Date  . AICD (automatic cardioverter/defibrillator) present   . Anemia, unspecified 12/13/2013  . Back pain, chronic    "just when I walk; mass on 3rd and 4th vertebrae right lower back"  . Biventricular implantable cardiac defibrillator in situ 2007, 2012   a. 2007;  b. 2012 Gen change: SJM 3231-40 Uni BiV ICD, ser # [id number].  Marland Kitchen CAD (coronary artery disease)    a. 05/2001 CABG x4: LIMA->LAD, VG->D1, VG->D2, VG->RCA.  . Cardiomyopathy, ischemic    a. 2012 s/p SJM 3231-40 Uni BiV ICD, ser # [id number].  . Carotid stenosis    a. 10/19/2011 carotid duplex - Mild hard plaque bilaterally. Stable 40-59% bilateral ICA stenosis. Carotid US (04/2013):  Bilateral 40-59% ICA.  F/u 1 year  . Cerumen impaction 10/28/2015  . Chronic combined systolic and diastolic CHF (congestive heart failure) (HCC)    a. EF as low  as 25% in 2006;  b. EF 60-65% in 12/2011;  c. 02/2013 Echo: EF 35-40, mod mid-dist antsept HK, Gr 2 DD, mild LVH.  . CKD (chronic kidney disease), stage IV ([place])    hx/notes 09/04/2016  . COPD (chronic obstructive pulmonary disease) ([place])   . COPD, severe ([place]) 01/22/2016  . 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 Dyslipidemia   . Gout ~ 08/2011  . History of vertebral fracture 09/18/2016  . Hypercalcemia 09/26/2013  . Hypertension   . Hypothyroidism   . Hypoxemia requiring supplemental oxygen   . LBBB (left bundle branch block)   . Lumbar compression fracture ([place])    L2/notes 09/04/2016  . Morbid obesity with BMI of 45.0-49.9, adult (HCC)    Ht. 5'4". BMI 47.2  . Mural thrombus of left ventricle    before 2003, while on Coumadin, No h/o CVA  . 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 Benay Spice, in remission  . On home oxygen therapy    "normally 2-3L; 24/7" (12/14/2015); "2L; 24/7" (09/04/2016)  . OSA on CPAP   . PAF (paroxysmal atrial fibrillation) (HCC)    on Coumadin  . Presence of permanent cardiac pacemaker   . 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 Urinary incontinence 06/25/2016  . Vertebral fracture, osteoporotic ([place]) 08/27/2016  . Vitamin D deficiency 06/09/2012   historical    Patient  Active Problem List   Diagnosis Date Noted  . History of vertebral fracture 09/18/2016  . Closed compression fracture of L2 lumbar vertebra ([place])   . Thrombocytopenia ([place])   . Compression fracture of body of thoracic vertebra ([place]) 09/04/2016  . Back pain 09/04/2016  . Urinary incontinence 06/25/2016  . Diabetic neuropathy ([place]) 03/01/2016  . Current use of insulin ([place]) 03/01/2016  . CKD (chronic kidney disease) stage 4, GFR 15-29 ml/min (HCC) 03/01/2016  . Skin lesion of back 02/18/2016  . COPD, severe ([place]) 01/22/2016  . Obesity hypoventilation  syndrome ([place]) 01/01/2016  . Debility 12/14/2015  . Chronic respiratory failure ([place]) 10/25/2014  . Medicare annual wellness visit, subsequent 09/12/2014  . Coagulopathy ([place]) 07/29/2014  . Morbid obesity ([place]) 07/29/2014  . Controlled type 2 diabetes mellitus with chronic kidney disease ([place]) 07/28/2014  . Anemia 12/13/2013  . DOE (dyspnea on exertion) 11/30/2013  . Edema 09/26/2013  . Hypercalcemia 09/26/2013  . Encounter for therapeutic drug monitoring 06/10/2013  . Chronic systolic heart failure ([place]) 05/01/2013  . Chronic combined systolic and diastolic CHF (congestive heart failure) ([place]) 05/01/2013  . Physical deconditioning 03/25/2013  . Hyperkalemia 03/12/2013  . Hypothyroidism 03/06/2013  . Vitamin D deficiency 06/09/2012  . History of Cardiomyopathy, ischemic   . OSA (obstructive sleep apnea) 09/05/2010  . LEG PAIN, BILATERAL 12/28/2009  . Diabetes mellitus type 2 with retinopathy ([place]) 10/28/2009  . Paroxysmal atrial fibrillation ([place]) 06/21/2009  . History of non-Hodgkin's lymphoma 04/21/2009  . MURAL THROMBUS, LEFT VENTRICLE 03/07/2009  . LYMPHEDEMA 03/07/2009  . LBBB 08/26/2008  . History of CVA (cerebrovascular accident) 08/26/2008  . Backache 08/26/2008  . Automatic implantable cardioverter-defibrillator in situ 08/26/2008  . Acute cystitis without hematuria 05/17/2008  . Carotid stenosis 05/20/2007  . Hyperlipidemia 03/19/2007  . Essential hypertension 11/20/2006    Past Surgical History:  Procedure Laterality Date  . ABDOMINAL HYSTERECTOMY  1982  . CARDIAC CATHETERIZATION  06/03/01  . CARDIOVERSION N/A 07/30/2014   Procedure: CARDIOVERSION;  Surgeon: Jolaine Artist, MD;  Location: [facility] ENDOSCOPY;  Service: Cardiovascular;  Laterality: N/A;  . CARDIOVERSION N/A 11/25/2014   Procedure: CARDIOVERSION;  Surgeon: Evans Lance, MD;  Location: [facility];  Service: Cardiovascular;  Laterality: N/A;  . CATARACT EXTRACTION     left eye  . CHOLECYSTECTOMY   1970  . CORONARY ARTERY BYPASS GRAFT  2003   CABG X4  . INSERT / REPLACE / REMOVE PACEMAKER  2007; 2012   w/AICD  . IR KYPHO LUMBAR INC FX REDUCE BONE BX UNI/BIL CANNULATION INC/IMAGING  09/11/2016  . REFRACTIVE SURGERY     right eye  . TEE WITHOUT CARDIOVERSION N/A 12/16/2015   Procedure: TRANSESOPHAGEAL ECHOCARDIOGRAM (TEE);  Surgeon: Jolaine Artist, MD;  Location: [facility] ENDOSCOPY;  Service: Cardiovascular;  Laterality: N/A;  . TUBAL LIGATION  1972    OB History    No data available       Home Medications    Prior to Admission medications   Medication Sig Start Date End Date Taking? Authorizing Provider  acetaminophen (TYLENOL) 500 MG tablet Take 1,000 mg by mouth 2 (two) times daily as needed for mild pain.     [provider]  albuterol (PROVENTIL HFA;VENTOLIN HFA) 108 (90 BASE) MCG/ACT inhaler Inhale 2 puffs into the lungs every 6 (six) hours as needed for wheezing or shortness of breath. 12/26/12   Mosie Lukes, MD  amiodarone (PACERONE) 200 MG tablet Take 200 mg by mouth daily.    [provider]  atorvastatin (LIPITOR) 10 MG tablet Take 1 tablet (10 mg total) by mouth daily. 02/15/17   Bensimhon, Shaune Pascal, MD  carvedilol (COREG) 12.5 MG tablet Take 1 tablet (12.5 mg total) by mouth 2 (two) times daily. 02/15/17   Bensimhon, Shaune Pascal, MD  dicyclomine (BENTYL) 20 MG tablet Take 1 tablet (20 mg total) by mouth 2 (two) times daily as needed for spasms. 09/28/16   Julianne Rice, MD  diphenoxylate-atropine (LOMOTIL) 2.5-0.025 MG tablet Take 1 tablet by mouth 4 (four) times daily as needed for diarrhea or loose stools. 09/28/16   Julianne Rice, MD  escitalopram (LEXAPRO) 10 MG tablet TAKE 1 TABLET EVERY DAY 11/15/16   Mosie Lukes, MD  fluticasone [facility]) 50 MCG/ACT nasal spray Place 2 sprays into both nostrils daily as needed for allergies or rhinitis. As needed for nasal stuffiness. Patient taking differently: Place 2 sprays into both nostrils daily as needed  for allergies or rhinitis.  01/13/14   Mosie Lukes, MD  gabapentin (NEURONTIN) 300 MG capsule Take 1 capsule (300 mg total) by mouth at bedtime. 01/29/17   Mosie Lukes, MD  glucose blood (FREESTYLE LITE) test strip DX: 250.60  Check sugars bid and as needed Patient taking differently: 1 each by Other route See admin instructions. Check blood sugar twice daily 05/22/13   Mosie Lukes, MD  insulin NPH (HUMULIN N,NOVOLIN N) 100 UNIT/ML injection Inject 7-40 Units into the skin See admin instructions. 40 units in the morning before breakfast then 7 units in the evening before supper/evening meal    [provider]  insulin regular (HUMULIN R) 250 units/2.72mL (100 units/mL) injection Inject 5 Units into the skin daily before breakfast.    [provider]  levothyroxine (SYNTHROID, LEVOTHROID) 88 MCG tablet Take 1 tablet (88 mcg total) by mouth daily. 02/01/17   Mosie Lukes, MD  levothyroxine (SYNTHROID, LEVOTHROID) 88 MCG tablet Take 88 mcg by mouth daily before breakfast.    [provider]  metolazone (ZAROXOLYN) 2.5 MG tablet Take 1 tablet (2.5 mg total) by mouth as needed (for weight greater than 235 lb). 02/26/17   Bensimhon, Shaune Pascal, MD  OXYGEN Inhale 2 L into the lungs continuous.     [provider]  torsemide (DEMADEX) 20 MG tablet Take 20-40 mg by mouth See admin instructions. 40 mg in the morning on Sun/Fri/Sat and 20 mg on Mon/Tues/Wed/Thurs    [provider]  warfarin (COUMADIN) 5 MG tablet TAKE AS DIRECTED BY ANTICOAGULATION CLINIC. 10/15/16   Evans Lance, MD    Family History Family History  Problem Relation Age of Onset  . Kidney cancer Mother        kidney and female repo - died @ 23  . Asthma Mother   . Cancer Mother        gyn and renal  . Heart disease Mother        CAD  . Heart disease Father        died @ 1  . Stroke Father   . Diabetes Father   . Hypertension Father   . Hyperlipidemia Father   . Heart attack  Father   . Hypertension Son   . Kidney cancer Maternal Uncle   . Cancer Maternal Uncle   . Cirrhosis Maternal Grandmother        non alcohol  . Cancer Maternal Grandfather   . Kidney cancer Maternal Grandfather   . Hypertension Maternal Aunt  Social History Social History  Substance Use Topics  . Smoking status: Never Smoker  . Smokeless tobacco: Never Used  . Alcohol use No     Allergies   Sulfonamide derivatives   Review of Systems Review of Systems  Constitutional: Negative for fever.  HENT: Negative for rhinorrhea and sore throat.   Eyes: Negative for redness.  Respiratory: Positive for shortness of breath (baseline). Negative for cough.   Cardiovascular: Negative for chest pain.  Gastrointestinal: Negative for abdominal pain, blood in stool, diarrhea, nausea and vomiting.  Genitourinary: Negative for dysuria and hematuria.  Musculoskeletal: Negative for myalgias.  Skin: Positive for rash.  Neurological: Negative for headaches.     Physical Exam Updated Vital Signs BP 100/80 (BP Location: Right Arm)   Pulse 70   Temp 99 F (37.2 C) (Oral)   Resp 19   SpO2 97%   Physical Exam  Constitutional: She appears well-developed and well-nourished.  HENT:  Head: Normocephalic and atraumatic.  Mouth/Throat: Oropharynx is clear and moist.  No intraoral lesions.   Eyes: Conjunctivae are normal. Right eye exhibits no discharge. Left eye exhibits no discharge.  Neck: Normal range of motion. Neck supple.  Cardiovascular: Normal rate, regular rhythm and normal heart sounds.   Pulmonary/Chest: Effort normal. No respiratory distress. She has no wheezes. She has no rales.  Abdominal: Soft. There is no tenderness.  Neurological: She is alert.  Skin: Skin is warm and dry. Rash noted. There is erythema.  Irregular petechial rash from the toes to the knee on just the right leg.  Mild warmth and swelling compared to the opposite side.  Psychiatric: She has a normal mood and  affect.  Nursing note and vitals reviewed.            ED Treatments / Results  Labs (all labs ordered are listed, but only abnormal results are displayed) Labs Reviewed  COMPREHENSIVE METABOLIC PANEL - Abnormal; Notable for the following:       Result Value   Glucose, Bld 115 (*)    BUN 49 (*)    Creatinine, Ser 3.42 (*)    Calcium 8.3 (*)    Total Protein 5.4 (*)    Albumin 3.0 (*)    GFR calc non Af Amer 12 (*)    GFR calc Af Amer 14 (*)    All other components within normal limits  CBC WITH DIFFERENTIAL/PLATELET - Abnormal; Notable for the following:    RBC 3.66 (*)    Hemoglobin 11.0 (*)    HCT 35.1 (*)    RDW 16.0 (*)    Platelets 69 (*)    All other components within normal limits  PROTIME-INR - Abnormal; Notable for the following:    Prothrombin Time 28.0 (*)    All other components within normal limits  I-STAT CG4 LACTIC ACID, ED  I-STAT CG4 LACTIC ACID, ED    EKG  EKG Interpretation None       Radiology Dg Chest 2 View  Result Date: 03/14/2017 CLINICAL DATA:  New onset of redness and pain in the right lower extremity. No cardiopulmonary symptoms. History of coronary artery disease, CHF, COPD. EXAM: CHEST  2 VIEW COMPARISON:  PA and lateral chest x-ray of June 28, 2016 FINDINGS: The study was performed in a wheelchair. The lung volumes are low. The cardiac silhouette is enlarged. The pulmonary vascularity is engorged. The interstitial markings are increased. I cannot exclude a small left pleural effusion. The ICD is in stable position. The sternal wires  are intact. There is calcification in the wall of the thoracic aorta. There is multilevel degenerative disc disease of the thoracic spine. IMPRESSION: Findings consistent with mild CHF and interstitial edema. No acute pneumonia. Thoracic aortic atherosclerosis. Electronically Signed   By: David  Martinique M.D.   On: 03/14/2017 11:01    Procedures Procedures (including critical care time)  Medications  Ordered in ED Medications - No data to display   Initial Impression / Assessment and Plan / ED Course  I have reviewed the triage vital signs and the nursing notes.  Pertinent labs & imaging results that were available during my care of the patient were reviewed by me and considered in my medical decision making (see chart for details).     Patient seen and examined. Work-up initiated. Reviewed with Dr. Dayna Barker who has seen patient.   Vital signs reviewed and are as follows: BP 100/80 (BP Location: Right Arm)   Pulse 72   Temp 99 F (37.2 C) (Oral)   Resp 18   SpO2 99%   DVT US ordered and is negative.   Plan: home with PCP f/u.   Pt urged to return with worsening pain, worsening swelling, expanding area of redness or streaking up extremity, fever, or any other concerns. Pt verbalizes understanding and agrees with plan.   Final Clinical Impressions(s) / ED Diagnoses   Final diagnoses:  Petechial rash   Rash of unilateral lower extremity.  Low concern for cellulitis.  Workup unremarkable.  No DVT.  Thrombocytopenia at baseline, no other symptoms of bleeding.  Creatinine elevated, but also at baseline.  Patient discussed and seen by Dr. Dayna Barker.  No indications for admission at this time but patient will require close PCP follow-up.  New Prescriptions Discharge Medication List as of 03/14/2017  2:51 PM       Carlisle Cater, PA-C 03/15/17 1500    Mesner, Corene Cornea, MD 03/20/17 [phone]

## 2017-03-14 NOTE — ED Notes (Signed)
Pt stable, ambulatory, states understanding of discharge instructions 

## 2017-03-14 NOTE — Discharge Instructions (Signed)
Please read and follow all provided instructions.  Your diagnoses today include:  1. Petechial rash    Tests performed today include:  Vital signs. See below for your results today.   Blood counts and electrolytes  Coumadin level - 2.6  Ultrasound - does not show any blood clots   Medications prescribed:   None  Take any prescribed medications only as directed.   Home care instructions:  Follow any educational materials contained in this packet. Keep affected area above the level of your heart when possible. Do not apply alcohol or hydrogen peroxide. Cover the area if it draining or weeping.   Follow-up instructions: Please follow-up with your primary care provider in the next 3 days for further evaluation of your symptoms and recheck.   Return instructions:  Return to the Emergency Department if you have:  Fever  Worsening symptoms  Worsening pain  Worsening swelling  Redness of the skin that moves away from the affected area, especially if it streaks away from the affected area   Any other emergent concerns  Your vital signs today were: BP 115/60    Pulse 75    Temp 99 F (37.2 C) (Oral)    Resp 18    SpO2 95%  If your blood pressure (BP) was elevated above 135/85 this visit, please have this repeated by your doctor within one month. --------------

## 2017-03-14 NOTE — ED Triage Notes (Signed)
Pt reports new redness on pain in RLE, started yesterday "like a red baseball" behind calf. Lower leg and foot red, tight, hot to touch.  Pt denies SOB, fevers, chills, reports recent changes to Coumadin regime.

## 2017-03-14 NOTE — Progress Notes (Signed)
Preliminary results by tech - Right Lower Ext. Venous Duplex Completed. Negative for deep and superficial vein thrombosis. Dex Blakely, BS, RDMS, RVT  

## 2017-03-14 NOTE — ED Notes (Signed)
Patient transported to Ultrasound 

## 2017-03-14 NOTE — ED Provider Notes (Signed)
Medical screening examination/treatment/procedure(s) were conducted as a shared visit with non-physician practitioner(s) and myself.  I personally evaluated the patient during the encounter.  76 year old female with multiple comorbidities presents the emergency department with a acute onset right lower extremity rash.  Started yesterday and progressively worse until today.  No new shortness of breath.  No chest pain, fever, nausea, vomiting or other associated symptoms.  Never had anything like this before. On exam she has what appears to be petechiae in her right lower extremity into distribution as depicted by the photos.  She has a good DP pulse on that side.  Her right leg does seem to be a little more swollen than her left however this could be related to bypass that she has had.  Her INR has been therapeutic on her Coumadin. Symptoms could be just venous stasis however is relatively rapid onset for that.  Her platelets are at her normal range as are the rest of her labs make an TTP, ITP at HUS unlikely especially with the distribution of only been on one leg.  Plan will be to evaluate with ultrasound to make sure there is no DVT there has this is okay should continue follow-up with a primary doctor or possibly vascular as needed.    Merrily Pew, MD 03/20/17 [phone]

## 2017-03-15 ENCOUNTER — Ambulatory Visit (INDEPENDENT_AMBULATORY_CARE_PROVIDER_SITE_OTHER): Payer: Medicare Other | Admitting: Cardiovascular Disease

## 2017-03-15 DIAGNOSIS — Z8673 Personal history of transient ischemic attack (TIA), and cerebral infarction without residual deficits: Secondary | ICD-10-CM

## 2017-03-15 DIAGNOSIS — Z5181 Encounter for therapeutic drug level monitoring: Secondary | ICD-10-CM

## 2017-03-15 DIAGNOSIS — I480 Paroxysmal atrial fibrillation: Secondary | ICD-10-CM | POA: Diagnosis not present

## 2017-03-19 DIAGNOSIS — E7800 Pure hypercholesterolemia, unspecified: Secondary | ICD-10-CM | POA: Diagnosis not present

## 2017-03-19 DIAGNOSIS — E039 Hypothyroidism, unspecified: Secondary | ICD-10-CM | POA: Diagnosis not present

## 2017-03-19 DIAGNOSIS — G609 Hereditary and idiopathic neuropathy, unspecified: Secondary | ICD-10-CM | POA: Diagnosis not present

## 2017-03-19 DIAGNOSIS — E1165 Type 2 diabetes mellitus with hyperglycemia: Secondary | ICD-10-CM | POA: Diagnosis not present

## 2017-03-19 DIAGNOSIS — I10 Essential (primary) hypertension: Secondary | ICD-10-CM | POA: Diagnosis not present

## 2017-03-19 DIAGNOSIS — N189 Chronic kidney disease, unspecified: Secondary | ICD-10-CM | POA: Diagnosis not present

## 2017-03-22 ENCOUNTER — Telehealth: Payer: Self-pay | Admitting: Family Medicine

## 2017-03-22 ENCOUNTER — Ambulatory Visit (INDEPENDENT_AMBULATORY_CARE_PROVIDER_SITE_OTHER): Payer: Medicare Other

## 2017-03-22 DIAGNOSIS — I5022 Chronic systolic (congestive) heart failure: Secondary | ICD-10-CM | POA: Diagnosis not present

## 2017-03-22 DIAGNOSIS — Z95810 Presence of automatic (implantable) cardiac defibrillator: Secondary | ICD-10-CM

## 2017-03-22 MED ORDER — DIPHENOXYLATE-ATROPINE 2.5-0.025 MG PO TABS: 1.0000 | ORAL_TABLET | Freq: Four times a day (QID) | ORAL | 0 refills | Status: DC | PRN

## 2017-03-22 NOTE — Telephone Encounter (Signed)
rx faxed

## 2017-03-22 NOTE — Progress Notes (Signed)
EPIC Encounter for ICM Monitoring  Patient Name: Destiny [name] is a 76 y.o. female Date: 03/22/2017 Primary Care Physican: Mosie Lukes, MD Primary Cardiologist: [place] Electrophysiologist: Lovena Le Nephrologist: [place] Weight:  233lb  Bi-V Pacing: >99% Battery Longevity: Reached ERI 02/21/2017         Heart Failure questions reviewed, pt asymptomatic.  Patient reported having diarrhea intermittently for the last couple of months, some days worse than others.    [place]: Thoracic impedance normal.  Prescribed dosage: Torsemide 20-40 mg by mouth as directed, 2 tablets daily on Friday, Saturday &Sunday, 1 tablet daily Monday through Thursday.Metolazone 2.5 1 tablet as needed for weight >= 235 lbs  Labs: 01/31/2017 Creatinine 3.07, BUN 47, Potassium 4.9, Sodium 135 10/15/2016 Creatinine 2.4, BUN 31.2,Potassium 4.1, Sodium 135 09/28/2016 Creatinine 2.19, BUN 20, Potassium 4.4, Sodium 137, EGFR 21-24 09/10/2016 Creatinine 2.32, BUN 31, Potassium 4.9, Sodium 135, EGFR 20-23 09/05/2016 Creatinine 3.02, BUN 35, Potassium 4.2, Sodium 138, EGFR 14-17 09/04/2016 Creatinine 2.75, BUN 36, Potassium 3.7, Sodium 136, EGFR 16-19  08/27/2016 Creatinine 3.02, BUN 42, Potassium 4.7, Sodium 134  08/20/2016 Creatinine 3.1, BUN 44.6, Potassium 4.4, Sodium 139  07/30/2016 Creatinine 3.4, BUN 44, Potassium 4.2, Sodium 137 05/21/2016 Creatinine 4.3, BUN 58, Potassium 4.4, Sodium 134 04/27/2016 Creatinine 3.73, BUN 44, Potassium 4.8, Sodium 137, EGFR 12.62  Recommendations: No changes.  Advised to use ER for any urgent symptoms  Encouraged to call for fluid symptoms.  Follow-up plan: ICM clinic phone appointment on 04/30/2017. Office appointment scheduled 03/27/2017 with Dr. Lovena Le.  Copy of ICM check sent to Dr. Lovena Le.   3 month ICM trend: 03/22/2017    1 Year ICM trend:       [name], RN 03/22/2017 9:32 AM

## 2017-03-22 NOTE — Telephone Encounter (Signed)
[facility] - Client [facility] Patient Name: Destiny [name] DOB: 1940/05/29 Initial Comment Caller states his wife is needing medication. She has severe diarrhea. She was on hold at the office and got disconnected. Nurse Assessment Nurse: Ardine Bjork, RN, [place] Date/Time (Eastern Time): 03/22/2017 4:38:43 PM Confirm and document reason for call. If symptomatic, describe symptoms. ---Caller states his wife is needing medication. She has severe diarrhea. She was on hold at the office and got disconnected. Sxs began yesterday. Denies other sxs. Does the patient have any new or worsening symptoms? ---Yes Will a triage be completed? ---Yes Related visit to physician within the last 2 weeks? ---No Does the PT have any chronic conditions? (i.e. diabetes, asthma, etc.) ---Yes List chronic conditions. ---Hx gallbladder removal 1970's,kidney dis, HTN, DM, CHF. Is this a behavioral health or substance abuse call? ---No Guidelines Guideline Title Affirmed Question Affirmed Notes Diarrhea [1] SEVERE diarrhea (e.g., 7 or more times / day more than normal) AND [2] age > 60 years Final Disposition User See Physician within 4 Hours (or PCP triage) Ardine Bjork, RN, [place] Comments Call to office at pt request for med to be called in to [facility] [place]-states will have nurse call pt regarding med request. pt informed that if office MD chooses not to call in med, to be seen in UC next 3-4 hrs-verbalizes understanding. Referrals [facility] [place] Disagree/Comply [place] Understands Yes PreDisposition Call Doctor Call Id: [phone]

## 2017-03-22 NOTE — Telephone Encounter (Signed)
°  Relation to [facility]:VOZD Call back [place]: [place], [place] - [address] [phone] (Phone) [phone] (Fax)    Reason for call:  Patient requesting diarrhea medication diphenoxylate-atropine (LOMOTIL) 2.5-0.025 MG tablet , due to loose stools, patient was transferred to team health patient declined and stated she would like her Rx sent today, please advise

## 2017-03-26 NOTE — Telephone Encounter (Signed)
Follow up call made to patient states she still has some stomach gripeing and diarrhea but it is better than it was. States she was taking Immodium but was tod it was not good for her kidneys. Says she is waiting on a call from her Kidney doctor regarding this. Offered to make appointment for patient, states she does not feel she can make it to the appointment. Advised patient I would forward information to provider.

## 2017-03-26 NOTE — Telephone Encounter (Signed)
She should come in or go to ER if worsens.

## 2017-03-26 NOTE — Telephone Encounter (Signed)
Patient notified. Agreed.

## 2017-03-27 ENCOUNTER — Ambulatory Visit (INDEPENDENT_AMBULATORY_CARE_PROVIDER_SITE_OTHER): Payer: Medicare Other | Admitting: Internal Medicine

## 2017-03-27 ENCOUNTER — Encounter (INDEPENDENT_AMBULATORY_CARE_PROVIDER_SITE_OTHER): Payer: Self-pay

## 2017-03-27 ENCOUNTER — Encounter: Payer: Self-pay | Admitting: Internal Medicine

## 2017-03-27 ENCOUNTER — Ambulatory Visit (HOSPITAL_COMMUNITY)
Admission: RE | Admit: 2017-03-27 | Discharge: 2017-03-27 | Disposition: A | Discharge status: Home / Self Care | Payer: Medicare Other | Source: Ambulatory Visit | Attending: Nephrology | Admitting: Nephrology

## 2017-03-27 VITALS — BP 128/68 | HR 108 | Ht 64.0 in | Wt 234.0 lb

## 2017-03-27 VITALS — BP 112/50 | HR 70 | Temp 98.1°F | Resp 20

## 2017-03-27 DIAGNOSIS — R0602 Shortness of breath: Secondary | ICD-10-CM | POA: Diagnosis not present

## 2017-03-27 DIAGNOSIS — I110 Hypertensive heart disease with heart failure: Secondary | ICD-10-CM | POA: Diagnosis not present

## 2017-03-27 DIAGNOSIS — N184 Chronic kidney disease, stage 4 (severe): Secondary | ICD-10-CM

## 2017-03-27 DIAGNOSIS — J9621 Acute and chronic respiratory failure with hypoxia: Secondary | ICD-10-CM | POA: Diagnosis not present

## 2017-03-27 DIAGNOSIS — N186 End stage renal disease: Secondary | ICD-10-CM | POA: Diagnosis not present

## 2017-03-27 DIAGNOSIS — I255 Ischemic cardiomyopathy: Secondary | ICD-10-CM | POA: Diagnosis not present

## 2017-03-27 DIAGNOSIS — R0600 Dyspnea, unspecified: Secondary | ICD-10-CM | POA: Diagnosis not present

## 2017-03-27 DIAGNOSIS — G9341 Metabolic encephalopathy: Secondary | ICD-10-CM | POA: Diagnosis not present

## 2017-03-27 DIAGNOSIS — I132 Hypertensive heart and chronic kidney disease with heart failure and with stage 5 chronic kidney disease, or end stage renal disease: Secondary | ICD-10-CM | POA: Diagnosis not present

## 2017-03-27 DIAGNOSIS — I509 Heart failure, unspecified: Secondary | ICD-10-CM | POA: Diagnosis not present

## 2017-03-27 DIAGNOSIS — J189 Pneumonia, unspecified organism: Secondary | ICD-10-CM | POA: Diagnosis not present

## 2017-03-27 DIAGNOSIS — I5043 Acute on chronic combined systolic (congestive) and diastolic (congestive) heart failure: Secondary | ICD-10-CM | POA: Diagnosis not present

## 2017-03-27 DIAGNOSIS — I5022 Chronic systolic (congestive) heart failure: Secondary | ICD-10-CM

## 2017-03-27 LAB — CBC
HCT: 35.7 % — ABNORMAL LOW (ref 36.0–46.0)
Hemoglobin: 11.3 g/dL — ABNORMAL LOW (ref 12.0–15.0)
MCH: 29.5 pg (ref 26.0–34.0)
MCHC: 31.7 g/dL (ref 30.0–36.0)
MCV: 93.2 fL (ref 78.0–100.0)
PLATELETS: 89 10*3/uL — AB (ref 150–400)
RBC: 3.83 MIL/uL — ABNORMAL LOW (ref 3.87–5.11)
RDW: 14.9 % (ref 11.5–15.5)
WBC: 5.3 10*3/uL (ref 4.0–10.5)

## 2017-03-27 LAB — RENAL FUNCTION PANEL
ALBUMIN: 2.8 g/dL — AB (ref 3.5–5.0)
Anion gap: 6 (ref 5–15)
BUN: 57 mg/dL — AB (ref 6–20)
CO2: 24 mmol/L (ref 22–32)
CREATININE: 3.18 mg/dL — AB (ref 0.44–1.00)
Calcium: 8.6 mg/dL — ABNORMAL LOW (ref 8.9–10.3)
Chloride: 104 mmol/L (ref 101–111)
GFR calc Af Amer: 15 mL/min — ABNORMAL LOW (ref >60)
GFR, EST NON AFRICAN AMERICAN: 13 mL/min — AB (ref >60)
Glucose, Bld: 141 mg/dL — ABNORMAL HIGH (ref 65–99)
PHOSPHORUS: 2.8 mg/dL (ref 2.5–4.6)
Potassium: 4.8 mmol/L (ref 3.5–5.1)
SODIUM: 134 mmol/L — AB (ref 135–145)

## 2017-03-27 MED ORDER — DARBEPOETIN ALFA 60 MCG/0.3ML IJ SOSY
PREFILLED_SYRINGE | INTRAMUSCULAR | Status: AC
  Administered 2017-03-27: 60 ug via SUBCUTANEOUS
  Filled 2017-03-27: qty 0.3

## 2017-03-27 MED ORDER — DARBEPOETIN ALFA 60 MCG/0.3ML IJ SOSY
60.0000 ug | PREFILLED_SYRINGE | INTRAMUSCULAR | Status: DC
  Administered 2017-03-27: 60 ug via SUBCUTANEOUS

## 2017-03-27 NOTE — Patient Instructions (Addendum)
Medication Instructions:  Your physician recommends that you continue on your current medications as directed. Please refer to the Current Medication list given to you today.  Labwork: You will get a BMP CBC and PT/INR  Testing/Procedures: You will be scheduled for a generator change with a change from BIV ICD to BIV PPM.  Follow-Up: You will follow up with device clinic in 10-14 days for a wound check after your procedure.  You will follow up with Dr. Lovena Le 91 days after your procedure.  Any Other Special Instructions Will Be Listed Below (If Applicable).  Please arrive at the [facility] main entrance of [place] hospital at:  1:30 pm on April 02, 2017.   You may have a light breakfast prior to 8:00 am.  Nothing to eat after 8:00 am.   Do not take any medications the morning of the procedure- No insulin, no fluid pills  Hold your WARFARIN for 2 days prior to your procedure.  Last dose will be November 17 your evening dose. Plan for one night stay-but you may be discharged the same day as your procedure. You will need someone to drive you home at discharge    If you need a refill on your cardiac medications before your next appointment, please call your pharmacy.

## 2017-03-27 NOTE — Progress Notes (Signed)
HPI Destiny [name] returns today for ongoing evaluation and management of her BiV ICD. She is a pleasant 76 yo man with chronic systolic heart failure, COPD on oxygen, and PAF who has maintained NSR on amiodarone. She has been bothered by diarrhea. The patient has not had any ICD shocks since her device was placed and she has severe lung disease. She has required oxygen for years. She has been bothered by diarrhea the last couple of weeks. It is usually better in the afternoons. Allergies  Allergen Reactions  . Sulfonamide Derivatives Other (See Comments)    Reaction unknown; "childhood allergy/mother"     Current Outpatient Medications  Medication Sig Dispense Refill  . acetaminophen (TYLENOL) 500 MG tablet Take 1,000 mg by mouth at bedtime as needed for mild pain.     Marland Kitchen albuterol (PROVENTIL HFA;VENTOLIN HFA) 108 (90 BASE) MCG/ACT inhaler Inhale 2 puffs into the lungs every 6 (six) hours as needed for wheezing or shortness of breath. 1 Inhaler 1  . amiodarone (PACERONE) 200 MG tablet Take 200 mg by mouth daily.    Marland Kitchen atorvastatin (LIPITOR) 10 MG tablet Take 1 tablet (10 mg total) by mouth daily. 30 tablet 3  . carvedilol (COREG) 12.5 MG tablet Take 1 tablet (12.5 mg total) by mouth 2 (two) times daily. 60 tablet 3  . dicyclomine (BENTYL) 20 MG tablet Take 1 tablet (20 mg total) by mouth 2 (two) times daily as needed for spasms. 30 tablet 0  . diphenoxylate-atropine (LOMOTIL) 2.5-0.025 MG tablet Take 1 tablet 4 (four) times daily as needed by mouth for diarrhea or loose stools. 30 tablet 0  . escitalopram (LEXAPRO) 10 MG tablet TAKE 1 TABLET EVERY DAY 90 tablet 1  . fluticasone (FLONASE) 50 MCG/ACT nasal spray Place 2 sprays into both nostrils daily as needed for allergies or rhinitis. As needed for nasal stuffiness. (Patient taking differently: Place 2 sprays into both nostrils daily as needed for allergies or rhinitis. ) 16 g 0  . gabapentin (NEURONTIN) 300 MG capsule Take 1 capsule (300  mg total) by mouth at bedtime. 90 capsule 0  . glucose blood (FREESTYLE LITE) test strip DX: 250.60  Check sugars bid and as needed (Patient taking differently: 1 each by Other route See admin instructions. Check blood sugar twice daily) 100 each 2  . insulin NPH (HUMULIN N,NOVOLIN N) 100 UNIT/ML injection Inject 30 Units into the skin daily before breakfast.     . insulin regular (HUMULIN R) 250 units/2.36mL (100 units/mL) injection Inject 5 Units into the skin daily before breakfast.    . levothyroxine (SYNTHROID, LEVOTHROID) 88 MCG tablet Take 1 tablet (88 mcg total) by mouth daily. 90 tablet 1  . metolazone (ZAROXOLYN) 2.5 MG tablet Take 1 tablet (2.5 mg total) by mouth as needed (for weight greater than 235 lb). 10 tablet 3  . OXYGEN Inhale 2 L into the lungs continuous.     . torsemide (DEMADEX) 20 MG tablet Take 20-40 mg by mouth See admin instructions. 40 mg in the morning on Sun/Fri/Sat and 20 mg on Mon/Tues/Wed/Thurs    . warfarin (COUMADIN) 5 MG tablet TAKE AS DIRECTED BY ANTICOAGULATION CLINIC. 90 tablet 1  . warfarin (COUMADIN) 5 MG tablet Take 2.5-5 mg by mouth See admin instructions. 2.5 mg (  1/2 tablet ) everyday except on Monday. Monday, pt takes 5 mg ( 1 tablet )     No current facility-administered medications for this visit.    Facility-Administered Medications Ordered  in Other Visits  Medication Dose Route Frequency Provider Last Rate Last Dose  . Darbepoetin Alfa (ARANESP) injection 60 mcg  60 mcg Subcutaneous Q28 days Donato Heinz, MD   60 mcg at 03/27/17 1311     Past Medical History:  Diagnosis Date  . AICD (automatic cardioverter/defibrillator) present   . Anemia, unspecified 12/13/2013  . Back pain, chronic    "just when I walk; mass on 3rd and 4th vertebrae right lower back"  . Biventricular implantable cardiac defibrillator in situ 2007, 2012   a. 2007;  b. 2012 Gen change: SJM 3231-40 Uni BiV ICD, ser # [id number].  Marland Kitchen CAD (coronary artery disease)    a.  05/2001 CABG x4: LIMA->LAD, VG->D1, VG->D2, VG->RCA.  . Cardiomyopathy, ischemic    a. 2012 s/p SJM 3231-40 Uni BiV ICD, ser # [id number].  . Carotid stenosis    a. 10/19/2011 carotid duplex - Mild hard plaque bilaterally. Stable 40-59% bilateral ICA stenosis. Carotid US (04/2013):  Bilateral 40-59% ICA.  F/u 1 year  . Cerumen impaction 10/28/2015  . Chronic combined systolic and diastolic CHF (congestive heart failure) (HCC)    a. EF as low as 25% in 2006;  b. EF 60-65% in 12/2011;  c. 02/2013 Echo: EF 35-40, mod mid-dist antsept HK, Gr 2 DD, mild LVH.  . CKD (chronic kidney disease), stage IV ([place])    hx/notes 09/04/2016  . COPD (chronic obstructive pulmonary disease) ([place])   . COPD, severe ([place]) 01/22/2016  . 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 Dyslipidemia   . Gout ~ 08/2011  . History of vertebral fracture 09/18/2016  . Hypercalcemia 09/26/2013  . Hypertension   . Hypothyroidism   . Hypoxemia requiring supplemental oxygen   . LBBB (left bundle branch block)   . Lumbar compression fracture ([place])    L2/notes 09/04/2016  . Morbid obesity with BMI of 45.0-49.9, adult (HCC)    Ht. 5'4". BMI 47.2  . Mural thrombus of left ventricle    before 2003, while on Coumadin, No h/o CVA  . 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 Benay Spice, in remission  . On home oxygen therapy    "normally 2-3L; 24/7" (12/14/2015); "2L; 24/7" (09/04/2016)  . OSA on CPAP   . PAF (paroxysmal atrial fibrillation) (HCC)    on Coumadin  . Presence of permanent cardiac pacemaker   . Retinopathy due to secondary diabetes ([place])    type II, uncontrolled  . Urinary incontinence 06/25/2016  . Vertebral fracture, osteoporotic ([place]) 08/27/2016  . Vitamin D deficiency 06/09/2012   historical    ROS:   All systems reviewed and negative except as noted in the HPI.   Past Surgical History:  Procedure Laterality Date  .  ABDOMINAL HYSTERECTOMY  1982  . CARDIAC CATHETERIZATION  06/03/01  . CATARACT EXTRACTION     left eye  . CHOLECYSTECTOMY  1970  . CORONARY ARTERY BYPASS GRAFT  2003   CABG X4  . INSERT / REPLACE / REMOVE PACEMAKER  2007; 2012   w/AICD  . IR KYPHO LUMBAR INC FX REDUCE BONE BX UNI/BIL CANNULATION INC/IMAGING  09/11/2016  . REFRACTIVE SURGERY     right eye  . TUBAL LIGATION  1972     Family History  Problem Relation Age of Onset  . Kidney cancer Mother        kidney and female repo - died @ 3  . Asthma  Mother   . Cancer Mother        gyn and renal  . Heart disease Mother        CAD  . Heart disease Father        died @ 64  . Stroke Father   . Diabetes Father   . Hypertension Father   . Hyperlipidemia Father   . Heart attack Father   . Hypertension Son   . Kidney cancer Maternal Uncle   . Cancer Maternal Uncle   . Cirrhosis Maternal Grandmother        non alcohol  . Cancer Maternal Grandfather   . Kidney cancer Maternal Grandfather   . Hypertension Maternal Aunt      Social History   Socioeconomic History  . Marital status: Married    Spouse name: Not on file  . Number of children: Y  . Years of education: Not on file  . Highest education level: Not on file  Social Needs  . Financial resource strain: Not on file  . Food insecurity - worry: Not on file  . Food insecurity - inability: Not on file  . Transportation needs - medical: Not on file  . Transportation needs - non-medical: Not on file  Occupational History  . 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   Tobacco Use  . Smoking status: Never Smoker  . Smokeless tobacco: Never Used  Substance and Sexual Activity  . Alcohol use: No  . Drug use: No  . Sexual activity: Not Currently    Comment: lives with husband, no major dietary restrictions  Other Topics Concern  . Not on file  Social History Narrative   Lives in [place] with her husband.  She does not  routinely exercise or adhere to any particular diet.       BP 128/68   Pulse (!) 108   Ht 5\' 4"  (1.626 m)   Wt 234 lb (106.1 kg) Comment: pt could'nt weight. Told weight  SpO2 93%   BMI 40.17 kg/m   Physical Exam:  Morbidly obese appearing 76 yo woman, NAD HEENT: Unremarkable Neck:  6 cm JVD, no thyromegally Lymphatics:  No adenopathy Back:  No CVA tenderness Lungs:  Clear with no wheezes HEART:  Regular rate rhythm, no murmurs, no rubs, no clicks Abd:  soft, positive bowel sounds, no organomegally, no rebound, no guarding Ext:  2 plus pulses, no edema, no cyanosis, no clubbing Skin:  No rashes no nodules Neuro:  CN II through XII intact, motor grossly intact  EKG - NSR with BiV Pacing  DEVICE  Normal device function.  See PaceArt for details. ERI  Assess/Plan: 1. Chronic systolic heart failure - her symptoms are multifactorial and class 2. She will continue her current meds.  2. ICD - her St. Jude BiV ICD is working normally. She has reached eri. Will schedule generator change out next week if diarrhea is improved. With her multiple comorbidities, and never having been shocked, I have recommended a BiV PPM be placed. 3. Obesity - she has not lost weight. She will be strongly encouraged to do so.  Mikle Bosworth.D.

## 2017-03-27 NOTE — Addendum Note (Signed)
Addended by: Willeen Cass A on: 03/27/2017 03:39 PM   Modules accepted: Orders

## 2017-03-28 LAB — CBC WITH DIFFERENTIAL/PLATELET
BASOS: 0 %
Basophils Absolute: 0 10*3/uL (ref 0.0–0.2)
EOS (ABSOLUTE): 0 10*3/uL (ref 0.0–0.4)
Eos: 1 %
HEMATOCRIT: 35.4 % (ref 34.0–46.6)
Hemoglobin: 11.5 g/dL (ref 11.1–15.9)
Immature Grans (Abs): 0 10*3/uL (ref 0.0–0.1)
Immature Granulocytes: 0 %
LYMPHS: 19 %
Lymphocytes Absolute: 0.9 10*3/uL (ref 0.7–3.1)
MCH: 30.3 pg (ref 26.6–33.0)
MCHC: 32.5 g/dL (ref 31.5–35.7)
MCV: 93 fL (ref 79–97)
MONOCYTES: 14 %
MONOS ABS: 0.7 10*3/uL (ref 0.1–0.9)
NEUTROS ABS: 3.1 10*3/uL (ref 1.4–7.0)
NEUTROS PCT: 66 %
Platelets: 96 10*3/uL — CL (ref 150–379)
RBC: 3.79 x10E6/uL (ref 3.77–5.28)
RDW: 14.8 % (ref 12.3–15.4)
WBC: 4.8 10*3/uL (ref 3.4–10.8)

## 2017-03-28 LAB — PROTIME-INR
INR: 2.8 — ABNORMAL HIGH (ref 0.8–1.2)
Prothrombin Time: 27.2 s — ABNORMAL HIGH (ref 9.1–12.0)

## 2017-03-28 LAB — BASIC METABOLIC PANEL
BUN / CREAT RATIO: 20 (ref 12–28)
BUN: 59 mg/dL — ABNORMAL HIGH (ref 8–27)
CO2: 24 mmol/L (ref 20–29)
Calcium: 8.7 mg/dL (ref 8.7–10.3)
Chloride: 103 mmol/L (ref 96–106)
Creatinine, Ser: 3.01 mg/dL — ABNORMAL HIGH (ref 0.57–1.00)
GFR, EST AFRICAN AMERICAN: 17 mL/min/{1.73_m2} — AB (ref >59)
GFR, EST NON AFRICAN AMERICAN: 15 mL/min/{1.73_m2} — AB (ref >59)
Glucose: 137 mg/dL — ABNORMAL HIGH (ref 65–99)
POTASSIUM: 4.9 mmol/L (ref 3.5–5.2)
SODIUM: 137 mmol/L (ref 134–144)

## 2017-03-29 ENCOUNTER — Encounter (HOSPITAL_COMMUNITY): Payer: Self-pay | Admitting: *Deleted

## 2017-03-29 ENCOUNTER — Inpatient Hospital Stay (HOSPITAL_COMMUNITY)
Admission: EM | Admit: 2017-03-29 | Discharge: 2017-04-19 | DRG: 264 | Disposition: A | Discharge status: Hospice | Payer: Medicare Other | Attending: Internal Medicine | Admitting: Internal Medicine

## 2017-03-29 ENCOUNTER — Emergency Department (HOSPITAL_COMMUNITY): Payer: Medicare Other

## 2017-03-29 ENCOUNTER — Other Ambulatory Visit: Payer: Self-pay

## 2017-03-29 DIAGNOSIS — Z9981 Dependence on supplemental oxygen: Secondary | ICD-10-CM

## 2017-03-29 DIAGNOSIS — N179 Acute kidney failure, unspecified: Secondary | ICD-10-CM | POA: Diagnosis not present

## 2017-03-29 DIAGNOSIS — I5043 Acute on chronic combined systolic (congestive) and diastolic (congestive) heart failure: Secondary | ICD-10-CM | POA: Diagnosis present

## 2017-03-29 DIAGNOSIS — I350 Nonrheumatic aortic (valve) stenosis: Secondary | ICD-10-CM | POA: Diagnosis present

## 2017-03-29 DIAGNOSIS — I501 Left ventricular failure: Secondary | ICD-10-CM | POA: Insufficient documentation

## 2017-03-29 DIAGNOSIS — D509 Iron deficiency anemia, unspecified: Secondary | ICD-10-CM | POA: Diagnosis not present

## 2017-03-29 DIAGNOSIS — Z66 Do not resuscitate: Secondary | ICD-10-CM | POA: Diagnosis present

## 2017-03-29 DIAGNOSIS — N184 Chronic kidney disease, stage 4 (severe): Secondary | ICD-10-CM | POA: Diagnosis not present

## 2017-03-29 DIAGNOSIS — R140 Abdominal distension (gaseous): Secondary | ICD-10-CM | POA: Diagnosis not present

## 2017-03-29 DIAGNOSIS — K5900 Constipation, unspecified: Secondary | ICD-10-CM | POA: Diagnosis present

## 2017-03-29 DIAGNOSIS — Z4901 Encounter for fitting and adjustment of extracorporeal dialysis catheter: Secondary | ICD-10-CM | POA: Diagnosis not present

## 2017-03-29 DIAGNOSIS — L8990 Pressure ulcer of unspecified site, unspecified stage: Secondary | ICD-10-CM | POA: Diagnosis present

## 2017-03-29 DIAGNOSIS — E871 Hypo-osmolality and hyponatremia: Secondary | ICD-10-CM | POA: Diagnosis present

## 2017-03-29 DIAGNOSIS — J440 Chronic obstructive pulmonary disease with acute lower respiratory infection: Secondary | ICD-10-CM | POA: Diagnosis present

## 2017-03-29 DIAGNOSIS — Z8051 Family history of malignant neoplasm of kidney: Secondary | ICD-10-CM

## 2017-03-29 DIAGNOSIS — E1159 Type 2 diabetes mellitus with other circulatory complications: Secondary | ICD-10-CM | POA: Diagnosis not present

## 2017-03-29 DIAGNOSIS — E1151 Type 2 diabetes mellitus with diabetic peripheral angiopathy without gangrene: Secondary | ICD-10-CM | POA: Diagnosis present

## 2017-03-29 DIAGNOSIS — E039 Hypothyroidism, unspecified: Secondary | ICD-10-CM | POA: Diagnosis present

## 2017-03-29 DIAGNOSIS — D631 Anemia in chronic kidney disease: Secondary | ICD-10-CM | POA: Diagnosis not present

## 2017-03-29 DIAGNOSIS — G9341 Metabolic encephalopathy: Secondary | ICD-10-CM | POA: Diagnosis not present

## 2017-03-29 DIAGNOSIS — J189 Pneumonia, unspecified organism: Secondary | ICD-10-CM | POA: Diagnosis not present

## 2017-03-29 DIAGNOSIS — R4020 Unspecified coma: Secondary | ICD-10-CM | POA: Diagnosis not present

## 2017-03-29 DIAGNOSIS — K7460 Unspecified cirrhosis of liver: Secondary | ICD-10-CM | POA: Diagnosis present

## 2017-03-29 DIAGNOSIS — Z833 Family history of diabetes mellitus: Secondary | ICD-10-CM

## 2017-03-29 DIAGNOSIS — Z992 Dependence on renal dialysis: Secondary | ICD-10-CM | POA: Diagnosis not present

## 2017-03-29 DIAGNOSIS — Z90710 Acquired absence of both cervix and uterus: Secondary | ICD-10-CM

## 2017-03-29 DIAGNOSIS — K760 Fatty (change of) liver, not elsewhere classified: Secondary | ICD-10-CM | POA: Diagnosis present

## 2017-03-29 DIAGNOSIS — J9601 Acute respiratory failure with hypoxia: Secondary | ICD-10-CM | POA: Diagnosis not present

## 2017-03-29 DIAGNOSIS — D6869 Other thrombophilia: Secondary | ICD-10-CM | POA: Diagnosis not present

## 2017-03-29 DIAGNOSIS — Z825 Family history of asthma and other chronic lower respiratory diseases: Secondary | ICD-10-CM

## 2017-03-29 DIAGNOSIS — I480 Paroxysmal atrial fibrillation: Secondary | ICD-10-CM | POA: Diagnosis present

## 2017-03-29 DIAGNOSIS — R109 Unspecified abdominal pain: Secondary | ICD-10-CM | POA: Diagnosis not present

## 2017-03-29 DIAGNOSIS — Z9049 Acquired absence of other specified parts of digestive tract: Secondary | ICD-10-CM

## 2017-03-29 DIAGNOSIS — Z8349 Family history of other endocrine, nutritional and metabolic diseases: Secondary | ICD-10-CM

## 2017-03-29 DIAGNOSIS — R748 Abnormal levels of other serum enzymes: Secondary | ICD-10-CM | POA: Diagnosis present

## 2017-03-29 DIAGNOSIS — R0902 Hypoxemia: Secondary | ICD-10-CM

## 2017-03-29 DIAGNOSIS — D649 Anemia, unspecified: Secondary | ICD-10-CM | POA: Diagnosis present

## 2017-03-29 DIAGNOSIS — R4182 Altered mental status, unspecified: Secondary | ICD-10-CM | POA: Diagnosis not present

## 2017-03-29 DIAGNOSIS — I2510 Atherosclerotic heart disease of native coronary artery without angina pectoris: Secondary | ICD-10-CM | POA: Diagnosis present

## 2017-03-29 DIAGNOSIS — Z794 Long term (current) use of insulin: Secondary | ICD-10-CM | POA: Diagnosis not present

## 2017-03-29 DIAGNOSIS — E782 Mixed hyperlipidemia: Secondary | ICD-10-CM | POA: Diagnosis not present

## 2017-03-29 DIAGNOSIS — Z95810 Presence of automatic (implantable) cardiac defibrillator: Secondary | ICD-10-CM | POA: Diagnosis present

## 2017-03-29 DIAGNOSIS — Z9889 Other specified postprocedural states: Secondary | ICD-10-CM

## 2017-03-29 DIAGNOSIS — G4733 Obstructive sleep apnea (adult) (pediatric): Secondary | ICD-10-CM | POA: Diagnosis present

## 2017-03-29 DIAGNOSIS — E11319 Type 2 diabetes mellitus with unspecified diabetic retinopathy without macular edema: Secondary | ICD-10-CM | POA: Diagnosis present

## 2017-03-29 DIAGNOSIS — R188 Other ascites: Secondary | ICD-10-CM | POA: Diagnosis present

## 2017-03-29 DIAGNOSIS — J449 Chronic obstructive pulmonary disease, unspecified: Secondary | ICD-10-CM | POA: Diagnosis present

## 2017-03-29 DIAGNOSIS — E785 Hyperlipidemia, unspecified: Secondary | ICD-10-CM | POA: Diagnosis present

## 2017-03-29 DIAGNOSIS — N390 Urinary tract infection, site not specified: Secondary | ICD-10-CM | POA: Diagnosis present

## 2017-03-29 DIAGNOSIS — M549 Dorsalgia, unspecified: Secondary | ICD-10-CM | POA: Diagnosis present

## 2017-03-29 DIAGNOSIS — M7989 Other specified soft tissue disorders: Secondary | ICD-10-CM | POA: Diagnosis not present

## 2017-03-29 DIAGNOSIS — E46 Unspecified protein-calorie malnutrition: Secondary | ICD-10-CM | POA: Diagnosis present

## 2017-03-29 DIAGNOSIS — Z79899 Other long term (current) drug therapy: Secondary | ICD-10-CM

## 2017-03-29 DIAGNOSIS — J90 Pleural effusion, not elsewhere classified: Secondary | ICD-10-CM | POA: Diagnosis not present

## 2017-03-29 DIAGNOSIS — I10 Essential (primary) hypertension: Secondary | ICD-10-CM | POA: Diagnosis present

## 2017-03-29 DIAGNOSIS — Z515 Encounter for palliative care: Secondary | ICD-10-CM | POA: Diagnosis not present

## 2017-03-29 DIAGNOSIS — I272 Pulmonary hypertension, unspecified: Secondary | ICD-10-CM | POA: Diagnosis present

## 2017-03-29 DIAGNOSIS — E872 Acidosis: Secondary | ICD-10-CM | POA: Diagnosis not present

## 2017-03-29 DIAGNOSIS — E1142 Type 2 diabetes mellitus with diabetic polyneuropathy: Secondary | ICD-10-CM | POA: Diagnosis present

## 2017-03-29 DIAGNOSIS — I110 Hypertensive heart disease with heart failure: Secondary | ICD-10-CM | POA: Diagnosis not present

## 2017-03-29 DIAGNOSIS — Z6841 Body Mass Index (BMI) 40.0 and over, adult: Secondary | ICD-10-CM

## 2017-03-29 DIAGNOSIS — F419 Anxiety disorder, unspecified: Secondary | ICD-10-CM | POA: Diagnosis present

## 2017-03-29 DIAGNOSIS — I132 Hypertensive heart and chronic kidney disease with heart failure and with stage 5 chronic kidney disease, or end stage renal disease: Principal | ICD-10-CM | POA: Diagnosis present

## 2017-03-29 DIAGNOSIS — I255 Ischemic cardiomyopathy: Secondary | ICD-10-CM | POA: Diagnosis present

## 2017-03-29 DIAGNOSIS — R0600 Dyspnea, unspecified: Secondary | ICD-10-CM | POA: Diagnosis not present

## 2017-03-29 DIAGNOSIS — N189 Chronic kidney disease, unspecified: Secondary | ICD-10-CM | POA: Diagnosis not present

## 2017-03-29 DIAGNOSIS — E1122 Type 2 diabetes mellitus with diabetic chronic kidney disease: Secondary | ICD-10-CM | POA: Diagnosis present

## 2017-03-29 DIAGNOSIS — Z9114 Patient's other noncompliance with medication regimen: Secondary | ICD-10-CM

## 2017-03-29 DIAGNOSIS — N186 End stage renal disease: Secondary | ICD-10-CM | POA: Diagnosis not present

## 2017-03-29 DIAGNOSIS — Z8249 Family history of ischemic heart disease and other diseases of the circulatory system: Secondary | ICD-10-CM

## 2017-03-29 DIAGNOSIS — J80 Acute respiratory distress syndrome: Secondary | ICD-10-CM | POA: Diagnosis not present

## 2017-03-29 DIAGNOSIS — E8770 Fluid overload, unspecified: Secondary | ICD-10-CM | POA: Diagnosis not present

## 2017-03-29 DIAGNOSIS — R0603 Acute respiratory distress: Secondary | ICD-10-CM

## 2017-03-29 DIAGNOSIS — E875 Hyperkalemia: Secondary | ICD-10-CM | POA: Diagnosis present

## 2017-03-29 DIAGNOSIS — Z87310 Personal history of (healed) osteoporosis fracture: Secondary | ICD-10-CM

## 2017-03-29 DIAGNOSIS — G9340 Encephalopathy, unspecified: Secondary | ICD-10-CM | POA: Diagnosis not present

## 2017-03-29 DIAGNOSIS — Z823 Family history of stroke: Secondary | ICD-10-CM

## 2017-03-29 DIAGNOSIS — N185 Chronic kidney disease, stage 5: Secondary | ICD-10-CM | POA: Diagnosis not present

## 2017-03-29 DIAGNOSIS — Z882 Allergy status to sulfonamides status: Secondary | ICD-10-CM

## 2017-03-29 DIAGNOSIS — J181 Lobar pneumonia, unspecified organism: Secondary | ICD-10-CM | POA: Diagnosis not present

## 2017-03-29 DIAGNOSIS — R269 Unspecified abnormalities of gait and mobility: Secondary | ICD-10-CM | POA: Diagnosis present

## 2017-03-29 DIAGNOSIS — J9811 Atelectasis: Secondary | ICD-10-CM | POA: Diagnosis present

## 2017-03-29 DIAGNOSIS — R197 Diarrhea, unspecified: Secondary | ICD-10-CM | POA: Diagnosis present

## 2017-03-29 DIAGNOSIS — Z8572 Personal history of non-Hodgkin lymphomas: Secondary | ICD-10-CM

## 2017-03-29 DIAGNOSIS — I509 Heart failure, unspecified: Secondary | ICD-10-CM

## 2017-03-29 DIAGNOSIS — R918 Other nonspecific abnormal finding of lung field: Secondary | ICD-10-CM | POA: Diagnosis not present

## 2017-03-29 DIAGNOSIS — J9621 Acute and chronic respiratory failure with hypoxia: Secondary | ICD-10-CM | POA: Diagnosis not present

## 2017-03-29 DIAGNOSIS — F329 Major depressive disorder, single episode, unspecified: Secondary | ICD-10-CM | POA: Diagnosis present

## 2017-03-29 DIAGNOSIS — Z7901 Long term (current) use of anticoagulants: Secondary | ICD-10-CM

## 2017-03-29 DIAGNOSIS — R0602 Shortness of breath: Secondary | ICD-10-CM | POA: Diagnosis not present

## 2017-03-29 DIAGNOSIS — D696 Thrombocytopenia, unspecified: Secondary | ICD-10-CM | POA: Diagnosis present

## 2017-03-29 DIAGNOSIS — I361 Nonrheumatic tricuspid (valve) insufficiency: Secondary | ICD-10-CM | POA: Diagnosis not present

## 2017-03-29 DIAGNOSIS — G8929 Other chronic pain: Secondary | ICD-10-CM | POA: Diagnosis present

## 2017-03-29 LAB — BASIC METABOLIC PANEL
ANION GAP: 6 (ref 5–15)
BUN: 60 mg/dL — ABNORMAL HIGH (ref 6–20)
CO2: 24 mmol/L (ref 22–32)
Calcium: 8.5 mg/dL — ABNORMAL LOW (ref 8.9–10.3)
Chloride: 102 mmol/L (ref 101–111)
Creatinine, Ser: 3.1 mg/dL — ABNORMAL HIGH (ref 0.44–1.00)
GFR calc Af Amer: 16 mL/min — ABNORMAL LOW (ref >60)
GFR, EST NON AFRICAN AMERICAN: 14 mL/min — AB (ref >60)
GLUCOSE: 169 mg/dL — AB (ref 65–99)
POTASSIUM: 5.6 mmol/L — AB (ref 3.5–5.1)
Sodium: 132 mmol/L — ABNORMAL LOW (ref 135–145)

## 2017-03-29 LAB — CUP PACEART INCLINIC DEVICE CHECK
Battery Remaining Longevity: 0 mo
HighPow Impedance: 39.0578
Implantable Lead Implant Date: 20070322
Implantable Lead Location: 753859
Implantable Lead Model: 7001
Implantable Pulse Generator Implant Date: 20120426
Lead Channel Pacing Threshold Amplitude: 1 V
Lead Channel Pacing Threshold Pulse Width: 0.5 ms
Lead Channel Sensing Intrinsic Amplitude: 11.7 mV
Lead Channel Setting Pacing Amplitude: 2 V
Lead Channel Setting Pacing Amplitude: 2.5 V
Lead Channel Setting Pacing Pulse Width: 0.5 ms
Lead Channel Setting Pacing Pulse Width: 0.6 ms
MDC IDC LEAD IMPLANT DT: 20070322
MDC IDC LEAD IMPLANT DT: 20070322
MDC IDC LEAD LOCATION: 753858
MDC IDC LEAD LOCATION: 753860
MDC IDC MSMT LEADCHNL LV IMPEDANCE VALUE: 287.5 Ohm
MDC IDC MSMT LEADCHNL LV PACING THRESHOLD AMPLITUDE: 1 V
MDC IDC MSMT LEADCHNL LV PACING THRESHOLD PULSEWIDTH: 0.6 ms
MDC IDC MSMT LEADCHNL RA IMPEDANCE VALUE: 287.5 Ohm
MDC IDC MSMT LEADCHNL RA PACING THRESHOLD AMPLITUDE: 1.25 V
MDC IDC MSMT LEADCHNL RA PACING THRESHOLD PULSEWIDTH: 0.5 ms
MDC IDC MSMT LEADCHNL RA SENSING INTR AMPL: 3.6 mV
MDC IDC MSMT LEADCHNL RV IMPEDANCE VALUE: 375 Ohm
MDC IDC SESS DTM: 20181114210656
MDC IDC SET LEADCHNL RA PACING AMPLITUDE: 2.375
MDC IDC SET LEADCHNL RV SENSING SENSITIVITY: 0.5 mV
MDC IDC STAT BRADY RA PERCENT PACED: 99.8 %
MDC IDC STAT BRADY RV PERCENT PACED: 99.86 %
Pulse Gen Serial Number: 631685

## 2017-03-29 LAB — CBC
HEMATOCRIT: 35.2 % — AB (ref 36.0–46.0)
HEMOGLOBIN: 11.2 g/dL — AB (ref 12.0–15.0)
MCH: 29.2 pg (ref 26.0–34.0)
MCHC: 31.8 g/dL (ref 30.0–36.0)
MCV: 91.7 fL (ref 78.0–100.0)
Platelets: 94 10*3/uL — ABNORMAL LOW (ref 150–400)
RBC: 3.84 MIL/uL — ABNORMAL LOW (ref 3.87–5.11)
RDW: 14.9 % (ref 11.5–15.5)
WBC: 6.6 10*3/uL (ref 4.0–10.5)

## 2017-03-29 LAB — PROTIME-INR
INR: 2.71
PROTHROMBIN TIME: 28.5 s — AB (ref 11.4–15.2)

## 2017-03-29 LAB — LACTIC ACID, PLASMA
Lactic Acid, Venous: 1.1 mmol/L (ref 0.5–1.9)
Lactic Acid, Venous: 1.2 mmol/L (ref 0.5–1.9)

## 2017-03-29 LAB — GLUCOSE, CAPILLARY: GLUCOSE-CAPILLARY: 139 mg/dL — AB (ref 65–99)

## 2017-03-29 LAB — MRSA PCR SCREENING: MRSA BY PCR: NEGATIVE

## 2017-03-29 LAB — BRAIN NATRIURETIC PEPTIDE: B Natriuretic Peptide: 1158.1 pg/mL — ABNORMAL HIGH (ref 0.0–100.0)

## 2017-03-29 MED ORDER — WARFARIN SODIUM 2.5 MG PO TABS
2.5000 mg | ORAL_TABLET | Freq: Every day | ORAL | Status: DC
  Administered 2017-03-30 – 2017-03-31 (×2): 2.5 mg via ORAL
  Filled 2017-03-29 (×6): qty 1

## 2017-03-29 MED ORDER — DEXTROSE 5 % IV SOLN
500.0000 mg | Freq: Once | INTRAVENOUS | Status: AC
  Administered 2017-03-29: 500 mg via INTRAVENOUS
  Filled 2017-03-29: qty 500

## 2017-03-29 MED ORDER — IPRATROPIUM-ALBUTEROL 0.5-2.5 (3) MG/3ML IN SOLN: 3.0000 mL | Freq: Four times a day (QID) | RESPIRATORY_TRACT | Status: DC | PRN

## 2017-03-29 MED ORDER — INSULIN NPH (HUMAN) (ISOPHANE) 100 UNIT/ML ~~LOC~~ SUSP
15.0000 [IU] | Freq: Every day | SUBCUTANEOUS | Status: DC
  Administered 2017-03-30 – 2017-04-03 (×5): 15 [IU] via SUBCUTANEOUS
  Filled 2017-03-29 (×2): qty 10

## 2017-03-29 MED ORDER — CEFTRIAXONE SODIUM 1 G IJ SOLR
1.0000 g | Freq: Once | INTRAMUSCULAR | Status: AC
  Administered 2017-03-29: 1 g via INTRAVENOUS
  Filled 2017-03-29: qty 10

## 2017-03-29 MED ORDER — FUROSEMIDE 10 MG/ML IJ SOLN
40.0000 mg | Freq: Once | INTRAMUSCULAR | Status: AC
  Administered 2017-03-29: 40 mg via INTRAVENOUS
  Filled 2017-03-29: qty 4

## 2017-03-29 MED ORDER — ONDANSETRON HCL 4 MG/2ML IJ SOLN: 4.0000 mg | Freq: Four times a day (QID) | INTRAMUSCULAR | Status: DC | PRN

## 2017-03-29 MED ORDER — WARFARIN - PHYSICIAN DOSING INPATIENT
Freq: Every day | Status: DC
  Administered 2017-03-31: 18:00:00

## 2017-03-29 MED ORDER — SODIUM CHLORIDE 0.9% FLUSH
3.0000 mL | Freq: Two times a day (BID) | INTRAVENOUS | Status: DC
  Administered 2017-03-29 – 2017-04-19 (×30): 3 mL via INTRAVENOUS

## 2017-03-29 MED ORDER — ESCITALOPRAM OXALATE 10 MG PO TABS
10.0000 mg | ORAL_TABLET | Freq: Every day | ORAL | Status: DC
  Administered 2017-03-29 – 2017-04-16 (×20): 10 mg via ORAL
  Filled 2017-03-29 (×19): qty 1

## 2017-03-29 MED ORDER — DIPHENOXYLATE-ATROPINE 2.5-0.025 MG PO TABS: 1.0000 | ORAL_TABLET | Freq: Four times a day (QID) | ORAL | Status: DC | PRN

## 2017-03-29 MED ORDER — ATORVASTATIN CALCIUM 10 MG PO TABS
10.0000 mg | ORAL_TABLET | Freq: Every day | ORAL | Status: DC
  Administered 2017-03-29 – 2017-04-17 (×18): 10 mg via ORAL
  Filled 2017-03-29 (×20): qty 1

## 2017-03-29 MED ORDER — SODIUM CHLORIDE 0.9 % IV SOLN: 250.0000 mL | INTRAVENOUS | Status: DC | PRN

## 2017-03-29 MED ORDER — DEXTROSE 5 % IV SOLN
500.0000 mg | INTRAVENOUS | Status: DC
  Administered 2017-03-30: 500 mg via INTRAVENOUS
  Filled 2017-03-29 (×2): qty 500

## 2017-03-29 MED ORDER — SODIUM CHLORIDE 0.9% FLUSH: 3.0000 mL | INTRAVENOUS | Status: DC | PRN

## 2017-03-29 MED ORDER — FLUTICASONE PROPIONATE 50 MCG/ACT NA SUSP
2.0000 | Freq: Every day | NASAL | Status: DC | PRN
  Filled 2017-03-29: qty 16

## 2017-03-29 MED ORDER — AMIODARONE HCL 200 MG PO TABS
200.0000 mg | ORAL_TABLET | Freq: Every day | ORAL | Status: DC
  Administered 2017-03-31 – 2017-04-19 (×20): 200 mg via ORAL
  Filled 2017-03-29 (×22): qty 1

## 2017-03-29 MED ORDER — DICYCLOMINE HCL 20 MG PO TABS: 20.0000 mg | ORAL_TABLET | Freq: Two times a day (BID) | ORAL | Status: DC | PRN

## 2017-03-29 MED ORDER — IPRATROPIUM-ALBUTEROL 0.5-2.5 (3) MG/3ML IN SOLN: 3.0000 mL | Freq: Four times a day (QID) | RESPIRATORY_TRACT | Status: DC

## 2017-03-29 MED ORDER — LEVOTHYROXINE SODIUM 88 MCG PO TABS
88.0000 ug | ORAL_TABLET | Freq: Every day | ORAL | Status: DC
  Administered 2017-03-30 – 2017-04-19 (×20): 88 ug via ORAL
  Filled 2017-03-29 (×20): qty 1

## 2017-03-29 MED ORDER — CARVEDILOL 12.5 MG PO TABS
12.5000 mg | ORAL_TABLET | Freq: Two times a day (BID) | ORAL | Status: DC
  Administered 2017-03-29 – 2017-04-05 (×13): 12.5 mg via ORAL
  Filled 2017-03-29 (×14): qty 1

## 2017-03-29 MED ORDER — DEXTROSE 5 % IV SOLN
1.0000 g | INTRAVENOUS | Status: DC
  Administered 2017-03-30 – 2017-04-02 (×4): 1 g via INTRAVENOUS
  Filled 2017-03-29 (×4): qty 10

## 2017-03-29 MED ORDER — ACETAMINOPHEN 325 MG PO TABS
650.0000 mg | ORAL_TABLET | ORAL | Status: DC | PRN
  Administered 2017-04-02 – 2017-04-16 (×4): 650 mg via ORAL
  Filled 2017-03-29 (×4): qty 2

## 2017-03-29 MED ORDER — GABAPENTIN 300 MG PO CAPS
300.0000 mg | ORAL_CAPSULE | Freq: Every day | ORAL | Status: DC
  Administered 2017-03-29 – 2017-04-05 (×7): 300 mg via ORAL
  Filled 2017-03-29 (×8): qty 1

## 2017-03-29 NOTE — ED Provider Notes (Signed)
[facility] EMERGENCY DEPARTMENT Provider Note   CSN: [id number] Arrival date & time: 03/29/17  9470     History   Chief Complaint Chief Complaint  Patient presents with  . Shortness of Breath    HPI Destiny [name] is a 76 y.o. female.  The history is provided by the patient. No language interpreter was used.  Shortness of Breath  This is a new problem. The average episode lasts 3 days. The problem occurs intermittently.The current episode started more than 2 days ago. The problem has been gradually worsening. Pertinent negatives include no fever. It is unknown what precipitated the problem. She has tried nothing for the symptoms. The treatment provided no relief. She has had prior hospitalizations. Associated medical issues include heart failure.  Patient complains of feeling short of breath she has a history of CHF.  She denies any swelling.  She is concerned about her pacemaker as she has been told that she needs to have the battery replaced she is scheduled for replacement on 1120.  Patient's husband reports patient has been having frequent diarrhea and frequent urination.  He reports patient is not able to control urine or stool.  He is concerned that patient may be dehydrated.  patient mouth is dry and lips are cracked.  Past Medical History:  Diagnosis Date  . AICD (automatic cardioverter/defibrillator) present   . Anemia, unspecified 12/13/2013  . Back pain, chronic    "just when I walk; mass on 3rd and 4th vertebrae right lower back"  . Biventricular implantable cardiac defibrillator in situ 2007, 2012   a. 2007;  b. 2012 Gen change: SJM 3231-40 Uni BiV ICD, ser # [id number].  Marland Kitchen CAD (coronary artery disease)    a. 05/2001 CABG x4: LIMA->LAD, VG->D1, VG->D2, VG->RCA.  . Cardiomyopathy, ischemic    a. 2012 s/p SJM 3231-40 Uni BiV ICD, ser # [id number].  . Carotid stenosis    a. 10/19/2011 carotid duplex - Mild hard plaque bilaterally. Stable 40-59% bilateral ICA  stenosis. Carotid US (04/2013):  Bilateral 40-59% ICA.  F/u 1 year  . Cerumen impaction 10/28/2015  . Chronic combined systolic and diastolic CHF (congestive heart failure) (HCC)    a. EF as low as 25% in 2006;  b. EF 60-65% in 12/2011;  c. 02/2013 Echo: EF 35-40, mod mid-dist antsept HK, Gr 2 DD, mild LVH.  . CKD (chronic kidney disease), stage IV ([place])    hx/notes 09/04/2016  . COPD (chronic obstructive pulmonary disease) ([place])   . 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 Dyslipidemia   . Gout ~ 08/2011  . History of vertebral fracture 09/18/2016  . Hypercalcemia 09/26/2013  . Hypertension   . Hypothyroidism   . Hypoxemia requiring supplemental oxygen   . LBBB (left bundle branch block)   . Lumbar compression fracture ([place])    L2/notes 09/04/2016  . Morbid obesity with BMI of 45.0-49.9, adult (HCC)    Ht. 5'4". BMI 47.2  . Mural thrombus of left ventricle    before 2003, while on Coumadin, No h/o CVA  . 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 Benay Spice, in remission  . On home oxygen therapy    "normally 2-3L; 24/7" (12/14/2015); "2L; 24/7" (09/04/2016)  . OSA on CPAP   . PAF (paroxysmal atrial fibrillation) (HCC)    on Coumadin  . Presence of permanent cardiac pacemaker   .  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 Urinary incontinence 06/25/2016  . Vertebral fracture, osteoporotic ([place]) 08/27/2016  . Vitamin D deficiency 06/09/2012   historical    Patient Active Problem List   Diagnosis Date Noted  . History of vertebral fracture 09/18/2016  . Closed compression fracture of L2 lumbar vertebra ([place])   . Thrombocytopenia ([place])   . Compression fracture of body of thoracic vertebra ([place]) 09/04/2016  . Back pain 09/04/2016  . Urinary incontinence 06/25/2016  . Diabetic neuropathy ([place]) 03/01/2016  . Current use of  insulin ([place]) 03/01/2016  . CKD (chronic kidney disease) stage 4, GFR 15-29 ml/min (HCC) 03/01/2016  . Skin lesion of back 02/18/2016  . COPD, severe ([place]) 01/22/2016  . Obesity hypoventilation syndrome ([place]) 01/01/2016  . Debility 12/14/2015  . Chronic respiratory failure ([place]) 10/25/2014  . Medicare annual wellness visit, subsequent 09/12/2014  . Coagulopathy ([place]) 07/29/2014  . Morbid obesity ([place]) 07/29/2014  . Controlled type 2 diabetes mellitus with chronic kidney disease ([place]) 07/28/2014  . Anemia 12/13/2013  . DOE (dyspnea on exertion) 11/30/2013  . Edema 09/26/2013  . Hypercalcemia 09/26/2013  . Encounter for therapeutic drug monitoring 06/10/2013  . Chronic systolic heart failure ([place]) 05/01/2013  . Chronic combined systolic and diastolic CHF (congestive heart failure) ([place]) 05/01/2013  . Physical deconditioning 03/25/2013  . Hyperkalemia 03/12/2013  . Hypothyroidism 03/06/2013  . Vitamin D deficiency 06/09/2012  . History of Cardiomyopathy, ischemic   . OSA (obstructive sleep apnea) 09/05/2010  . LEG PAIN, BILATERAL 12/28/2009  . Diabetes mellitus type 2 with retinopathy ([place]) 10/28/2009  . Paroxysmal atrial fibrillation ([place]) 06/21/2009  . History of non-Hodgkin's lymphoma 04/21/2009  . MURAL THROMBUS, LEFT VENTRICLE 03/07/2009  . LYMPHEDEMA 03/07/2009  . LBBB 08/26/2008  . History of CVA (cerebrovascular accident) 08/26/2008  . Backache 08/26/2008  . Automatic implantable cardioverter-defibrillator in situ 08/26/2008  . Acute cystitis without hematuria 05/17/2008  . Carotid stenosis 05/20/2007  . Hyperlipidemia 03/19/2007  . Essential hypertension 11/20/2006    Past Surgical History:  Procedure Laterality Date  . ABDOMINAL HYSTERECTOMY  1982  . CARDIAC CATHETERIZATION  06/03/01  . CARDIOVERSION N/A 11/25/2014   Performed by Evans Lance, MD at [facility] ENDOSCOPY  . CARDIOVERSION N/A 07/30/2014   Performed by Jolaine Artist, MD at [facility] ENDOSCOPY  . CATARACT  EXTRACTION     left eye  . CHOLECYSTECTOMY  1970  . CORONARY ARTERY BYPASS GRAFT  2003   CABG X4  . INSERT / REPLACE / REMOVE PACEMAKER  2007; 2012   w/AICD  . IR KYPHO LUMBAR INC FX REDUCE BONE BX UNI/BIL CANNULATION INC/IMAGING  09/11/2016  . REFRACTIVE SURGERY     right eye  . TRANSESOPHAGEAL ECHOCARDIOGRAM (TEE) N/A 12/16/2015   Performed by Jolaine Artist, MD at [place]  . TUBAL LIGATION  1972    OB History    No data available       Home Medications    Prior to Admission medications   Medication Sig Start Date End Date Taking? Authorizing Provider  acetaminophen (TYLENOL) 500 MG tablet Take 1,000 mg every 6 (six) hours as needed by mouth for mild pain or headache.     [provider]  albuterol (PROVENTIL HFA;VENTOLIN HFA) 108 (90 BASE) MCG/ACT inhaler Inhale 2 puffs into the lungs every 6 (six) hours as needed for wheezing or shortness of breath. 12/26/12   Mosie Lukes, MD  amiodarone (PACERONE) 200 MG tablet Take  200 mg by mouth daily.    [provider]  atorvastatin (LIPITOR) 10 MG tablet Take 1 tablet (10 mg total) by mouth daily. 02/15/17   Bensimhon, Shaune Pascal, MD  carvedilol (COREG) 12.5 MG tablet Take 1 tablet (12.5 mg total) by mouth 2 (two) times daily. 02/15/17   Bensimhon, Shaune Pascal, MD  dicyclomine (BENTYL) 20 MG tablet Take 1 tablet (20 mg total) by mouth 2 (two) times daily as needed for spasms. 09/28/16   Julianne Rice, MD  diphenoxylate-atropine (LOMOTIL) 2.5-0.025 MG tablet Take 1 tablet 4 (four) times daily as needed by mouth for diarrhea or loose stools. 03/22/17   Mosie Lukes, MD  escitalopram (LEXAPRO) 10 MG tablet TAKE 1 TABLET EVERY DAY Patient taking differently: Take 10 mg by mouth daily 11/15/16   Mosie Lukes, MD  fluticasone [facility]) 50 MCG/ACT nasal spray Place 2 sprays into both nostrils daily as needed for allergies or rhinitis. As needed for nasal stuffiness. Patient taking differently: Place 2 sprays daily as  needed into both nostrils for allergies or rhinitis.  01/13/14   Mosie Lukes, MD  gabapentin (NEURONTIN) 300 MG capsule Take 1 capsule (300 mg total) by mouth at bedtime. 01/29/17   Mosie Lukes, MD  glucose blood (FREESTYLE LITE) test strip DX: 250.60  Check sugars bid and as needed Patient not taking: Reported on 03/28/2017 05/22/13   Mosie Lukes, MD  insulin NPH (HUMULIN N,NOVOLIN N) 100 UNIT/ML injection Inject 15 Units daily before breakfast into the skin.     [provider]  levothyroxine (SYNTHROID, LEVOTHROID) 88 MCG tablet Take 1 tablet (88 mcg total) by mouth daily. 02/01/17   Mosie Lukes, MD  metolazone (ZAROXOLYN) 2.5 MG tablet Take 1 tablet (2.5 mg total) by mouth as needed (for weight greater than 235 lb). Patient not taking: Reported on 03/28/2017 02/26/17   Bensimhon, Shaune Pascal, MD  OXYGEN Inhale 3 L continuous into the lungs.     [provider]  torsemide (DEMADEX) 20 MG tablet Take 20 mg daily by mouth.     [provider]  warfarin (COUMADIN) 5 MG tablet TAKE AS DIRECTED BY ANTICOAGULATION CLINIC. Patient taking differently: Take 5 mg by mouth daily on Monday. Take 2.5 mg by mouth daily on all other days 10/15/16   Evans Lance, MD    Family History Family History  Problem Relation Age of Onset  . Kidney cancer Mother        kidney and female repo - died @ 90  . Asthma Mother   . Cancer Mother        gyn and renal  . Heart disease Mother        CAD  . Heart disease Father        died @ 45  . Stroke Father   . Diabetes Father   . Hypertension Father   . Hyperlipidemia Father   . Heart attack Father   . Hypertension Son   . Kidney cancer Maternal Uncle   . Cancer Maternal Uncle   . Cirrhosis Maternal Grandmother        non alcohol  . Cancer Maternal Grandfather   . Kidney cancer Maternal Grandfather   . Hypertension Maternal Aunt     Social History Social History   Tobacco Use  . Smoking status: Never Smoker  .  Smokeless tobacco: Never Used  Substance Use Topics  . Alcohol use: No  . Drug use: No  Allergies   Sulfonamide derivatives   Review of Systems Review of Systems  Constitutional: Negative for fever.  Respiratory: Positive for shortness of breath.   All other systems reviewed and are negative.    Physical Exam Updated Vital Signs BP (!) 116/54   Pulse 70   Temp 98.6 F (37 C) (Oral)   Resp (!) 22   Ht 5\' 4"  (1.626 m)   Wt 106.1 kg (234 lb)   SpO2 97%   BMI 40.17 kg/m   Physical Exam  Constitutional: She is oriented to person, place, and time. She appears well-developed and well-nourished.  HENT:  Head: Normocephalic.  Mouth/Throat: Oropharynx is clear and moist.  Dry cracked lips  Eyes: EOM are normal. Pupils are equal, round, and reactive to light.  Neck: Normal range of motion.  Cardiovascular: Normal rate.  Pulmonary/Chest: Effort normal. She has decreased breath sounds.  Abdominal: Soft. She exhibits no distension.  Musculoskeletal: Normal range of motion.       Right lower leg: Normal.  Neurological: She is alert and oriented to person, place, and time.  Skin: Skin is warm.  Psychiatric: She has a normal mood and affect.  Nursing note and vitals reviewed.    ED Treatments / Results  Labs (all labs ordered are listed, but only abnormal results are displayed) Labs Reviewed  BASIC METABOLIC PANEL - Abnormal; Notable for the following components:      Result Value   Sodium 132 (*)    Potassium 5.6 (*)    Glucose, Bld 169 (*)    BUN 60 (*)    Creatinine, Ser 3.10 (*)    Calcium 8.5 (*)    GFR calc non Af Amer 14 (*)    GFR calc Af Amer 16 (*)    All other components within normal limits  CBC - Abnormal; Notable for the following components:   RBC 3.84 (*)    Hemoglobin 11.2 (*)    HCT 35.2 (*)    Platelets 94 (*)    All other components within normal limits  URINALYSIS, ROUTINE W REFLEX MICROSCOPIC  BRAIN NATRIURETIC PEPTIDE  CBG  MONITORING, ED    EKG  EKG Interpretation  Date/Time:  Friday March 29 2017 09:41:18 EST Ventricular Rate:  70 PR Interval:    QRS Duration: 186 QT Interval:  482 QTC Calculation: 520 R Axis:   -163 Text Interpretation:  AV dual-paced rhythm No significant change since last tracing Confirmed by Blanchie Dessert [phone]) on 03/29/2017 11:19:34 AM       Radiology No results found.  Procedures Procedures (including critical care time)  Medications Ordered in ED Medications - No data to display   Initial Impression / Assessment and Plan / ED Course  I have reviewed the triage vital signs and the nursing notes.  Pertinent labs & imaging results that were available during my care of the patient were reviewed by me and considered in my medical decision making (see chart for details).     Pt's husband has not given her lasix in 2 days.  Pt is more short of breath than usual.  Pt has increased 02 at home.  Pt appears to have CHF on chest xray but also may have pneumonia.  Pt given Rocephin and Zithromax.    Hospitalist consulted for admission Final Clinical Impressions(s) / ED Diagnoses   Final diagnoses:  Dyspnea, unspecified type  Acute congestive heart failure, unspecified heart failure type ([place])  Community acquired pneumonia, unspecified laterality  ED Discharge Orders    None      I spoke to hospitalist who will admit. Fransico Meadow, PA-C 03/29/17 [place], [place] 03/29/17 1452    Blanchie Dessert, MD 03/29/17 2002

## 2017-03-29 NOTE — ED Notes (Signed)
Patient transported to X-ray 

## 2017-03-29 NOTE — ED Notes (Signed)
ED Provider at bedside. 

## 2017-03-29 NOTE — H&P (Signed)
History and Physical    Destiny [name]:[id number] DOB: 27-Aug-1940 DOA: 03/29/2017  PCP: Mosie Lukes, MD Dr. Lovena Le is Cardiologist  Patient coming from: Home  Chief Complaint: Dyspnea  HPI: Destiny [name] is a obese 76 y.o. 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  Apparently she has been struggling with diarrhea for the last 2 weeks and she decided it would be best to discontinue her home torsemide on account of possible dehydration.  She appears to have had good oral intake with no nausea or vomiting.  She states that she has not had any diarrhea today and denies any fevers or chills at home along with any abdominal cramping.  She denies any weight gain or recent lower extremity edema.  Of note, she is very concerned about the battery in her pacemaker/AICD for which she requires replacement soon on 11/20.   ED Course: In the emergency department, she was noted to have a chest x-ray with bilateral pleural effusions as well as cardiomegaly suggestive of volume overload.  She has been started on a one-time dose of IV Lasix.  Additionally, the chest x-ray demonstrates a right-sided pneumonia for which she has also been started on IV azithromycin and Rocephin.  Her nasal cannula has been increased to 4 L on account of some noted hypoxemia.  She appears to be in very mild respiratory distress but does struggle with full sentences.  Review of Systems: As per HPI otherwise 10 point review of systems negative.   Past Medical History:  Diagnosis Date  . AICD (automatic cardioverter/defibrillator) present   . Anemia, unspecified 12/13/2013  . Back pain, chronic    "just when I walk; mass on 3rd and 4th vertebrae right lower  back"  . Biventricular implantable cardiac defibrillator in situ 2007, 2012   a. 2007;  b. 2012 Gen change: SJM 3231-40 Uni BiV ICD, ser # [id number].  Marland Kitchen CAD (coronary artery disease)    a. 05/2001 CABG x4: LIMA->LAD, VG->D1, VG->D2, VG->RCA.  . Cardiomyopathy, ischemic    a. 2012 s/p SJM 3231-40 Uni BiV ICD, ser # [id number].  . Carotid stenosis    a. 10/19/2011 carotid duplex - Mild hard plaque bilaterally. Stable 40-59% bilateral ICA stenosis. Carotid US (04/2013):  Bilateral 40-59% ICA.  F/u 1 year  . Cerumen impaction 10/28/2015  . Chronic combined systolic and diastolic CHF (congestive heart failure) (HCC)    a. EF as low as 25% in 2006;  b. EF 60-65% in 12/2011;  c. 02/2013 Echo: EF 35-40, mod mid-dist antsept HK, Gr 2 DD, mild LVH.  . CKD (chronic kidney disease), stage IV ([place])    hx/notes 09/04/2016  . COPD (chronic obstructive pulmonary disease) ([place])   . COPD, severe ([place]) 01/22/2016  . 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 Dyslipidemia   . Gout ~ 08/2011  . History of vertebral fracture 09/18/2016  . Hypercalcemia 09/26/2013  . Hypertension   . Hypothyroidism   . Hypoxemia requiring supplemental oxygen   . LBBB (left bundle branch block)   . Lumbar compression fracture ([place])    L2/notes 09/04/2016  . Morbid obesity with BMI of 45.0-49.9, adult (HCC)    Ht. 5'4".  BMI 47.2  . Mural thrombus of left ventricle    before 2003, while on Coumadin, No h/o CVA  . 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 Benay Spice, in remission  . On home oxygen therapy    "normally 2-3L; 24/7" (12/14/2015); "2L; 24/7" (09/04/2016)  . OSA on CPAP   . PAF (paroxysmal atrial fibrillation) (HCC)    on Coumadin  . Presence of permanent cardiac pacemaker   . Retinopathy due to secondary diabetes ([place])    type II, uncontrolled  . Urinary incontinence 06/25/2016  . Vertebral fracture, osteoporotic ([place]) 08/27/2016   . Vitamin D deficiency 06/09/2012   historical    Past Surgical History:  Procedure Laterality Date  . ABDOMINAL HYSTERECTOMY  1982  . CARDIAC CATHETERIZATION  06/03/01  . CARDIOVERSION N/A 11/25/2014   Performed by Evans Lance, MD at [facility] ENDOSCOPY  . CARDIOVERSION N/A 07/30/2014   Performed by Jolaine Artist, MD at [facility] ENDOSCOPY  . CATARACT EXTRACTION     left eye  . CHOLECYSTECTOMY  1970  . CORONARY ARTERY BYPASS GRAFT  2003   CABG X4  . INSERT / REPLACE / REMOVE PACEMAKER  2007; 2012   w/AICD  . IR KYPHO LUMBAR INC FX REDUCE BONE BX UNI/BIL CANNULATION INC/IMAGING  09/11/2016  . REFRACTIVE SURGERY     right eye  . TRANSESOPHAGEAL ECHOCARDIOGRAM (TEE) N/A 12/16/2015   Performed by Jolaine Artist, MD at [facility]  . TUBAL LIGATION  1972     reports that  has never smoked. she has never used smokeless tobacco. She reports that she does not drink alcohol or use drugs.  Allergies  Allergen Reactions  . Sulfonamide Derivatives Other (See Comments)    Reaction unknown; "childhood allergy/mother"    Family History  Problem Relation Age of Onset  . Kidney cancer Mother        kidney and female repo - died @ 9  . Asthma Mother   . Cancer Mother        gyn and renal  . Heart disease Mother        CAD  . Heart disease Father        died @ 9  . Stroke Father   . Diabetes Father   . Hypertension Father   . Hyperlipidemia Father   . Heart attack Father   . Hypertension Son   . Kidney cancer Maternal Uncle   . Cancer Maternal Uncle   . Cirrhosis Maternal Grandmother        non alcohol  . Cancer Maternal Grandfather   . Kidney cancer Maternal Grandfather   . Hypertension Maternal Aunt     Prior to Admission medications   Medication Sig Start Date End Date Taking? Authorizing Provider  acetaminophen (TYLENOL) 500 MG tablet Take 1,000 mg every 6 (six) hours as needed by mouth for mild pain or headache.    Yes [provider]  albuterol (PROVENTIL  HFA;VENTOLIN HFA) 108 (90 BASE) MCG/ACT inhaler Inhale 2 puffs into the lungs every 6 (six) hours as needed for wheezing or shortness of breath. 12/26/12  Yes Mosie Lukes, MD  amiodarone (PACERONE) 200 MG tablet Take 200 mg by mouth daily.   Yes [provider]  atorvastatin (LIPITOR) 10 MG tablet Take 1 tablet (10 mg total) by mouth daily. 02/15/17  Yes Bensimhon, Shaune Pascal, MD  carvedilol (COREG) 12.5 MG tablet Take 1 tablet (12.5 mg total) by mouth 2 (two)  times daily. 02/15/17  Yes Bensimhon, Shaune Pascal, MD  dicyclomine (BENTYL) 20 MG tablet Take 1 tablet (20 mg total) by mouth 2 (two) times daily as needed for spasms. 09/28/16  Yes Julianne Rice, MD  diphenoxylate-atropine (LOMOTIL) 2.5-0.025 MG tablet Take 1 tablet 4 (four) times daily as needed by mouth for diarrhea or loose stools. 03/22/17  Yes Mosie Lukes, MD  escitalopram (LEXAPRO) 10 MG tablet TAKE 1 TABLET EVERY DAY Patient taking differently: Take 10 mg by mouth daily 11/15/16  Yes Mosie Lukes, MD  fluticasone (FLONASE) 50 MCG/ACT nasal spray Place 2 sprays into both nostrils daily as needed for allergies or rhinitis. As needed for nasal stuffiness. Patient taking differently: Place 2 sprays daily as needed into both nostrils for allergies or rhinitis.  01/13/14  Yes Mosie Lukes, MD  gabapentin (NEURONTIN) 300 MG capsule Take 1 capsule (300 mg total) by mouth at bedtime. 01/29/17  Yes Mosie Lukes, MD  glucose blood (FREESTYLE LITE) test strip DX: 250.60  Check sugars bid and as needed 05/22/13  Yes Mosie Lukes, MD  insulin NPH (HUMULIN N,NOVOLIN N) 100 UNIT/ML injection Inject 15 Units daily before breakfast into the skin.    Yes [provider]  levothyroxine (SYNTHROID, LEVOTHROID) 88 MCG tablet Take 1 tablet (88 mcg total) by mouth daily. 02/01/17  Yes Mosie Lukes, MD  OXYGEN Inhale 3 L continuous into the lungs.    Yes [provider]  torsemide (DEMADEX) 20 MG tablet Take 20 mg See admin  instructions by mouth. Friday, Saturday, Sunday take 40 mg Monday- Thursday ( 20 mg )   Yes [provider]  warfarin (COUMADIN) 5 MG tablet TAKE AS DIRECTED BY ANTICOAGULATION CLINIC. Patient taking differently: Take 5 mg by mouth daily on Monday. Take 2.5 mg by mouth daily on all other days 10/15/16  Yes Evans Lance, MD  metolazone (ZAROXOLYN) 2.5 MG tablet Take 1 tablet (2.5 mg total) by mouth as needed (for weight greater than 235 lb). Patient not taking: Reported on 03/28/2017 02/26/17   Jolaine Artist, MD    Physical Exam: Vitals:   03/29/17 1315 03/29/17 1415 03/29/17 1500 03/29/17 1515  BP: (!) 110/50 130/83 (!) 113/45 (!) 120/46  Pulse: 70 73 69 69  Resp: (!) 22 19 17 16   Temp:      TempSrc:      SpO2: 97% 100% 100% 100%  Weight:      Height:        Constitutional: NAD, calm, comfortable; obese Vitals:   03/29/17 1315 03/29/17 1415 03/29/17 1500 03/29/17 1515  BP: (!) 110/50 130/83 (!) 113/45 (!) 120/46  Pulse: 70 73 69 69  Resp: (!) 22 19 17 16   Temp:      TempSrc:      SpO2: 97% 100% 100% 100%  Weight:      Height:       Eyes: PERRL, lids and conjunctivae normal ENMT: Mucous membranes are moist. Posterior pharynx clear of any exudate or lesions.Normal dentition.  Neck: normal, supple, no masses, no thyromegaly Respiratory: clear to auscultation bilaterally, no wheezing, no crackles. Normal respiratory effort. No accessory muscle use. On 4L [place]; baseline 2L Cardiovascular: Regular rate and rhythm, no murmurs / rubs / gallops. Trace bilateral ext edema.  2+ pedal pulses. No carotid bruits.  Abdomen: no tenderness, no masses palpated. No hepatosplenomegaly. Bowel sounds positive.  Musculoskeletal: no clubbing / cyanosis. No joint deformity upper and lower extremities. Good ROM, no  contractures. Normal muscle tone.  Skin: no rashes, lesions, ulcers. No induration Neurologic: CN 2-12 grossly intact. Sensation intact, DTR normal. Strength 5/5 in all 4.    Psychiatric: Normal judgment and insight. Alert and oriented x 3. Normal mood.    Labs on Admission: I have personally reviewed following labs and imaging studies  CBC: Recent Labs  Lab 03/27/17 1224 03/27/17 1559 03/29/17 0947  WBC 5.3 4.8 6.6  NEUTROABS  --  3.1  --   HGB 11.3* 11.5 11.2*  HCT 35.7* 35.4 35.2*  MCV 93.2 93 91.7  PLT 89* 96* 94*   Basic Metabolic Panel: Recent Labs  Lab 03/27/17 1224 03/27/17 1559 03/29/17 0947  NA 134* 137 132*  K 4.8 4.9 5.6*  CL 104 103 102  CO2 24 24 24   GLUCOSE 141* 137* 169*  BUN 57* 59* 60*  CREATININE 3.18* 3.01* 3.10*  CALCIUM 8.6* 8.7 8.5*  PHOS 2.8  --   --    GFR: Estimated Creatinine Clearance: 18.9 mL/min (A) (by C-G formula based on SCr of 3.1 mg/dL (H)). Liver Function Tests: Recent Labs  Lab 03/27/17 1224  ALBUMIN 2.8*   No results for input(s): LIPASE, AMYLASE in the last 168 hours. No results for input(s): AMMONIA in the last 168 hours. Coagulation Profile: Recent Labs  Lab 03/27/17 1559  INR 2.8*   Cardiac Enzymes: No results for input(s): CKTOTAL, CKMB, CKMBINDEX, TROPONINI in the last 168 hours. BNP (last 3 results) No results for input(s): PROBNP in the last 8760 hours. HbA1C: No results for input(s): HGBA1C in the last 72 hours. CBG: No results for input(s): GLUCAP in the last 168 hours. Lipid Profile: No results for input(s): CHOL, HDL, LDLCALC, TRIG, CHOLHDL, LDLDIRECT in the last 72 hours. Thyroid Function Tests: No results for input(s): TSH, T4TOTAL, FREET4, T3FREE, THYROIDAB in the last 72 hours. Anemia Panel: No results for input(s): VITAMINB12, FOLATE, FERRITIN, TIBC, IRON, RETICCTPCT in the last 72 hours. Urine analysis:    Component Value Date/Time   COLORURINE YELLOW 09/10/2016 1055   APPEARANCEUR HAZY (A) 09/10/2016 1055   LABSPEC 1.018 09/10/2016 1055   PHURINE 5.0 09/10/2016 1055   GLUCOSEU NEGATIVE 09/10/2016 1055   GLUCOSEU NEGATIVE 06/25/2016 1002   HGBUR NEGATIVE  09/10/2016 1055   HGBUR large 05/17/2008 1325   BILIRUBINUR NEGATIVE 09/10/2016 1055   KETONESUR NEGATIVE 09/10/2016 1055   PROTEINUR NEGATIVE 09/10/2016 1055   UROBILINOGEN 0.2 06/25/2016 1002   NITRITE NEGATIVE 09/10/2016 1055   LEUKOCYTESUR NEGATIVE 09/10/2016 1055    Radiological Exams on Admission: Dg Chest 2 View  Result Date: 03/29/2017 CLINICAL DATA:  Shortness of Breath EXAM: CHEST  2 VIEW COMPARISON:  March 14, 2017 FINDINGS: There are pleural effusions bilaterally. There is patchy alveolar opacity in the right mid and lower lung zones. There is atelectasis in the left base. Heart is mildly enlarged with pulmonary vascularity within normal limits. Pacemaker leads are attached to the right atrium and middle cardiac vein. Patient is status post coronary artery bypass grafting. No adenopathy. No bone lesions. IMPRESSION: Mild cardiomegaly with pleural effusions bilaterally. Airspace opacity right mid lower lung zones, suspicious for pneumonia. Left base atelectasis. Pacemaker leads attached to right atrium and middle cardiac vein. Electronically Signed   By: Lowella Grip III M.D.   On: 03/29/2017 13:17    Assessment/Plan Principal Problem:   Acute on chronic combined systolic and diastolic CHF (congestive heart failure) (HCC) Active Problems:   Diabetes mellitus type 2 with retinopathy ([place])   Hyperlipidemia  Essential hypertension   Automatic implantable cardioverter-defibrillator in situ   Hypothyroidism   Anemia   Morbid obesity (HCC)   COPD, severe (HCC)   CKD (chronic kidney disease) stage 4, GFR 15-29 ml/min (HCC)   Community acquired pneumonia   Acute hypoxemic respiratory failure (HCC)   Acute hypoxemic respiratory failure secondary to acute on chronic combined systolic and diastolic CHF-likely due to medication noncompliance -Lasix IV for diuresis x 1 dose today; hold home torsemide -Hold off on further diuresis until response assessed and pt noted to have  stable BP readings; could hopefully resume in am if good response - 2D echocardiogram -O2 via nasal cannula with possible need for BiPAP if respiratory distress worsens -Continue to monitor in stepdown unit  Community-acquired pneumonia -Cultures pending -Urine antigens -Rocephin and azithromycin for empiric coverage  COPD on home oxygen - Continue oxygen and wean as tolerated to baseline of 2 L nasal cannula -No active wheezing noted, DuoNeb's every 6 hours prn  CKD stage IV -Baseline creatinine between 3 and 3.5 -Assess response to diuresis with Lasix; may need nephrology consultation if patient does not diurese well  DMII -Continue current home insulin regimen -Carb modified diet  Prior mural thrombus to LV -On Coumadin for anticoagulation; therapeutic INR 2.8 -INR in AM   Recent diarrhea -Stool studies with Cdiff   DVT prophylaxis:On Coumadin-therapeutic Code Status: Full Family Communication: None Disposition Plan: 24-48 hours after diuresis Consults called: None Admission status: Inpt/SDU   Cliffton Spradley D Manuella Ghazi DO [facility] Pager [phone]  If 7PM-7AM, please contact night-coverage [email or web address] Password [facility]  03/29/2017, 3:33 PM

## 2017-03-29 NOTE — ED Triage Notes (Addendum)
Pt in c/o onset SOB today & weakness, denies pain, pt seen by cardiology yesterday, pt scheduled to have [place] pacemaker replaced next week, pt has CHF, pt takes diuretic, A&O x4, pt uses 3 L [place] at baseline, speaks in short sentences, pt c/o diarrhea ongoing for a week

## 2017-03-30 DIAGNOSIS — J449 Chronic obstructive pulmonary disease, unspecified: Secondary | ICD-10-CM

## 2017-03-30 DIAGNOSIS — Z794 Long term (current) use of insulin: Secondary | ICD-10-CM

## 2017-03-30 DIAGNOSIS — Z95810 Presence of automatic (implantable) cardiac defibrillator: Secondary | ICD-10-CM

## 2017-03-30 DIAGNOSIS — J189 Pneumonia, unspecified organism: Secondary | ICD-10-CM

## 2017-03-30 DIAGNOSIS — R0600 Dyspnea, unspecified: Secondary | ICD-10-CM

## 2017-03-30 DIAGNOSIS — E11319 Type 2 diabetes mellitus with unspecified diabetic retinopathy without macular edema: Secondary | ICD-10-CM

## 2017-03-30 DIAGNOSIS — D509 Iron deficiency anemia, unspecified: Secondary | ICD-10-CM

## 2017-03-30 DIAGNOSIS — I509 Heart failure, unspecified: Secondary | ICD-10-CM

## 2017-03-30 DIAGNOSIS — J181 Lobar pneumonia, unspecified organism: Secondary | ICD-10-CM

## 2017-03-30 DIAGNOSIS — I10 Essential (primary) hypertension: Secondary | ICD-10-CM

## 2017-03-30 DIAGNOSIS — J9601 Acute respiratory failure with hypoxia: Secondary | ICD-10-CM

## 2017-03-30 DIAGNOSIS — N184 Chronic kidney disease, stage 4 (severe): Secondary | ICD-10-CM

## 2017-03-30 DIAGNOSIS — E039 Hypothyroidism, unspecified: Secondary | ICD-10-CM

## 2017-03-30 DIAGNOSIS — E782 Mixed hyperlipidemia: Secondary | ICD-10-CM

## 2017-03-30 DIAGNOSIS — L8990 Pressure ulcer of unspecified site, unspecified stage: Secondary | ICD-10-CM

## 2017-03-30 LAB — URINALYSIS, ROUTINE W REFLEX MICROSCOPIC
Bilirubin Urine: NEGATIVE
GLUCOSE, UA: NEGATIVE mg/dL
HGB URINE DIPSTICK: NEGATIVE
KETONES UR: NEGATIVE mg/dL
Nitrite: NEGATIVE
PROTEIN: NEGATIVE mg/dL
Specific Gravity, Urine: 1.013 (ref 1.005–1.030)
pH: 5 (ref 5.0–8.0)

## 2017-03-30 LAB — BASIC METABOLIC PANEL
Anion gap: 9 (ref 5–15)
BUN: 61 mg/dL — AB (ref 6–20)
CALCIUM: 8.5 mg/dL — AB (ref 8.9–10.3)
CHLORIDE: 101 mmol/L (ref 101–111)
CO2: 23 mmol/L (ref 22–32)
CREATININE: 3.01 mg/dL — AB (ref 0.44–1.00)
GFR, EST AFRICAN AMERICAN: 17 mL/min — AB (ref >60)
GFR, EST NON AFRICAN AMERICAN: 14 mL/min — AB (ref >60)
Glucose, Bld: 118 mg/dL — ABNORMAL HIGH (ref 65–99)
Potassium: 5.1 mmol/L (ref 3.5–5.1)
SODIUM: 133 mmol/L — AB (ref 135–145)

## 2017-03-30 LAB — HIV ANTIBODY (ROUTINE TESTING W REFLEX): HIV SCREEN 4TH GENERATION: NONREACTIVE

## 2017-03-30 LAB — GLUCOSE, CAPILLARY
GLUCOSE-CAPILLARY: 151 mg/dL — AB (ref 65–99)
GLUCOSE-CAPILLARY: 151 mg/dL — AB (ref 65–99)
Glucose-Capillary: 141 mg/dL — ABNORMAL HIGH (ref 65–99)
Glucose-Capillary: 168 mg/dL — ABNORMAL HIGH (ref 65–99)

## 2017-03-30 LAB — PROTIME-INR
INR: 2.73
PROTHROMBIN TIME: 28.7 s — AB (ref 11.4–15.2)

## 2017-03-30 LAB — STREP PNEUMONIAE URINARY ANTIGEN: STREP PNEUMO URINARY ANTIGEN: NEGATIVE

## 2017-03-30 MED ORDER — SODIUM CHLORIDE 0.9 % IN NEBU
3.0000 mL | INHALATION_SOLUTION | Freq: Two times a day (BID) | RESPIRATORY_TRACT | Status: DC
  Administered 2017-04-01 – 2017-04-02 (×4): 3 mL via RESPIRATORY_TRACT
  Filled 2017-03-30 (×4): qty 3

## 2017-03-30 MED ORDER — IPRATROPIUM-ALBUTEROL 0.5-2.5 (3) MG/3ML IN SOLN: 3.0000 mL | Freq: Four times a day (QID) | RESPIRATORY_TRACT | Status: DC

## 2017-03-30 MED ORDER — OXYMETAZOLINE HCL 0.05 % NA SOLN
1.0000 | Freq: Two times a day (BID) | NASAL | Status: DC
  Administered 2017-03-30 – 2017-04-01 (×5): 1 via NASAL
  Filled 2017-03-30: qty 15

## 2017-03-30 MED ORDER — TORSEMIDE 20 MG PO TABS
20.0000 mg | ORAL_TABLET | ORAL | Status: DC
  Administered 2017-04-01 – 2017-04-02 (×2): 20 mg via ORAL
  Filled 2017-03-30 (×3): qty 1

## 2017-03-30 MED ORDER — TORSEMIDE 20 MG PO TABS
40.0000 mg | ORAL_TABLET | ORAL | Status: DC
  Administered 2017-03-31: 40 mg via ORAL
  Filled 2017-03-30: qty 2

## 2017-03-30 MED ORDER — IPRATROPIUM-ALBUTEROL 0.5-2.5 (3) MG/3ML IN SOLN
3.0000 mL | RESPIRATORY_TRACT | Status: DC | PRN
  Administered 2017-04-01: 3 mL via RESPIRATORY_TRACT
  Filled 2017-03-30 (×2): qty 3

## 2017-03-30 MED ORDER — TORSEMIDE 20 MG PO TABS: 20.0000 mg | ORAL_TABLET | ORAL | Status: DC

## 2017-03-30 NOTE — Progress Notes (Signed)
PROGRESS NOTE  Destiny [name] VOH:[id number] DOB: [birth year] DOA: 03/29/2017 PCP: Mosie Lukes, MD  HPI/Recap of past 24 hours: Destiny [name] is a obese 76 y.o. 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  Per her husband this morning, she had diarrhea at home for about 2 weeks which has resolved since she has been in the hospital.   There were no acute events overnight. This morning the patient endorses nasal stuffiness which makes her very uncomfortable. Afrin has worked in the past and it is ordered. She denies chest pain. CXR reviewed and showed right lower lobe infiltrates and bilateral pleural effusions. Speech therapy to r/o dysphagia. She is on 2-3 L O2 supplementation during the day and BIPAP at night.   Assessment/Plan: Principal Problem:   Acute on chronic combined systolic and diastolic CHF (congestive heart failure) (HCC) Active Problems:   Diabetes mellitus type 2 with retinopathy ([place])   Hyperlipidemia   Essential hypertension   Automatic implantable cardioverter-defibrillator in situ   Hypothyroidism   Anemia   Morbid obesity (HCC)   COPD, severe (HCC)   CKD (chronic kidney disease) stage 4, GFR 15-29 ml/min ([place])   Community acquired pneumonia   Acute hypoxemic respiratory failure (HCC)   Pressure injury of skin  Acute on chronic hypoxic respiratory failure most likely 2/2 to CAP, present on admission vs acute on chronic combined systolic and diastolic CHF-likely due to medication noncompliance -Lasix IV for diuresis x 1 dose -restart home torsemide -EF 25-30% (10/20/14) post biv AICD -Repeat echo -BNP 1.158 (03/29/17) from 829 (01/01/16) -On 2-3L O2 continuously at home and BIPAP at  night -Continue O2 supplement to maintain O2 saturation 92% or greater -BIPAP at night  Community-acquired pneumonia, present on admission -Cultures blood x2 in process -Urine antigens -Rocephin and azithromycin for empiric coverage -MRSA negative  Asymptomatic pyuria, present on admission -U/A positive 03/29/17 -Denies symptoms -afebrile, no leukocytosis, lactic acid 1.2  COPD on home oxygen - Continue oxygen and wean as tolerated to baseline of 2-3 L nasal cannula - No active wheezing noted, DuoNeb's q 6 hours, hypersaline nebs daily  CKD stage IV -Baseline creatinine between 3 and 3.5 -Assess response to diuresis with Lasix; may need nephrology consultation if patient does not diuresed well -closely monitor urine output -Follows with nephrology as outpatient  Insulin dependent DMII -Continue current home insulin regimen -Carb modified diet -hypoglycemic protocol as needed  Prior mural thrombus to LV -On Coumadin for anticoagulation; therapeutic INR 2.73 -INR in AM   Recent diarrhea -Stool studies with Cdiff pending -contact precaution until stools negative  Depression/anxiety -stable -escitalopram  Essential HTN -stable -amiodarone, carvedilol  Diabetic polyneuropathy -stable -gabapentin  Code Status: Full  Family Communication: Spouse at bedside. All questions answered to his satisfaction  Disposition Plan: Will stay another midnight to continue treatment for CAP, present on admission and suspected acute pulmonary edema.   Consultants:  None  Procedures:  None  Antimicrobials:  IV azithromycin and IV ceftriaxone, day#1  DVT prophylaxis:  Therapeutic on warfarin   Objective: Vitals:   03/30/17 0400 03/30/17 0406 03/30/17 0500 03/30/17 0600  BP: (!) 135/55  (!) 122/50 (!) 139/56  Pulse: 74  73 74  Resp: (!) 23  (!) 24 (!)  22  Temp:  97.9 F (36.6 C)    TempSrc:  Axillary    SpO2: 93%  100% 100%  Weight:  107.9 kg (237 lb 14  oz)    Height:        Intake/Output Summary (Last 24 hours) at 03/30/2017 0802 Last data filed at 03/30/2017 0000 Gross per 24 hour  Intake 153 ml  Output 100 ml  Net 53 ml   Filed Weights   03/29/17 0943 03/29/17 1842 03/30/17 0406  Weight: 106.1 kg (234 lb) 107.9 kg (237 lb 14 oz) 107.9 kg (237 lb 14 oz)    Exam:   General:  76 yo CF WD WN on nasal cannula 3L in NAD  Cardiovascular: RRR with no rubs or gallop  Respiratory: Diffused crackles bilaterally. No wheezes noted  Abdomen: Obese soft NT ND NBS x4 quadrants.  Musculoskeletal: 2/4 pulses in all 4 extremities  Skin: pressure ulcer, present on admission  Psychiatry: Mood is appropriate for condition and setting.   Data Reviewed: CBC: Recent Labs  Lab 03/27/17 1224 03/27/17 1559 03/29/17 0947  WBC 5.3 4.8 6.6  NEUTROABS  --  3.1  --   HGB 11.3* 11.5 11.2*  HCT 35.7* 35.4 35.2*  MCV 93.2 93 91.7  PLT 89* 96* 94*   Basic Metabolic Panel: Recent Labs  Lab 03/27/17 1224 03/27/17 1559 03/29/17 0947 03/30/17 0411  NA 134* 137 132* 133*  K 4.8 4.9 5.6* 5.1  CL 104 103 102 101  CO2 24 24 24 23   GLUCOSE 141* 137* 169* 118*  BUN 57* 59* 60* 61*  CREATININE 3.18* 3.01* 3.10* 3.01*  CALCIUM 8.6* 8.7 8.5* 8.5*  PHOS 2.8  --   --   --    GFR: Estimated Creatinine Clearance: 19.7 mL/min (A) (by C-G formula based on SCr of 3.01 mg/dL (H)). Liver Function Tests: Recent Labs  Lab 03/27/17 1224  ALBUMIN 2.8*   No results for input(s): LIPASE, AMYLASE in the last 168 hours. No results for input(s): AMMONIA in the last 168 hours. Coagulation Profile: Recent Labs  Lab 03/27/17 1559 03/29/17 2124 03/30/17 0411  INR 2.8* 2.71 2.73   Cardiac Enzymes: No results for input(s): CKTOTAL, CKMB, CKMBINDEX, TROPONINI in the last 168 hours. BNP (last 3 results) No results for input(s): PROBNP in the last 8760 hours. HbA1C: No results for input(s): HGBA1C in the last 72 hours. CBG: Recent Labs  Lab  03/29/17 2107  GLUCAP 139*   Lipid Profile: No results for input(s): CHOL, HDL, LDLCALC, TRIG, CHOLHDL, LDLDIRECT in the last 72 hours. Thyroid Function Tests: No results for input(s): TSH, T4TOTAL, FREET4, T3FREE, THYROIDAB in the last 72 hours. Anemia Panel: No results for input(s): VITAMINB12, FOLATE, FERRITIN, TIBC, IRON, RETICCTPCT in the last 72 hours. Urine analysis:    Component Value Date/Time   COLORURINE YELLOW 03/29/2017 2348   APPEARANCEUR HAZY (A) 03/29/2017 2348   LABSPEC 1.013 03/29/2017 2348   PHURINE 5.0 03/29/2017 2348   GLUCOSEU NEGATIVE 03/29/2017 2348   GLUCOSEU NEGATIVE 06/25/2016 1002   HGBUR NEGATIVE 03/29/2017 2348   HGBUR large 05/17/2008 1325   BILIRUBINUR NEGATIVE 03/29/2017 [place] 03/29/2017 2348   PROTEINUR NEGATIVE 03/29/2017 2348   UROBILINOGEN 0.2 06/25/2016 1002   NITRITE NEGATIVE 03/29/2017 2348   LEUKOCYTESUR LARGE (A) 03/29/2017 2348   Sepsis Labs: @LABRCNTIP (procalcitonin:4,lacticidven:4)  ) Recent Results (from the past 240 hour(s))  MRSA PCR Screening     Status: None   Collection Time: 03/29/17  6:46 PM  Result Value Ref Range Status   MRSA by PCR NEGATIVE NEGATIVE Final    Comment:        The GeneXpert MRSA Assay (FDA approved for NASAL specimens only), is one component of a comprehensive MRSA colonization surveillance program. It is not intended to diagnose MRSA infection nor to guide or monitor treatment for MRSA infections.       Studies: Dg Chest 2 View  Result Date: 03/29/2017 CLINICAL DATA:  Shortness of Breath EXAM: CHEST  2 VIEW COMPARISON:  March 14, 2017 FINDINGS: There are pleural effusions bilaterally. There is patchy alveolar opacity in the right mid and lower lung zones. There is atelectasis in the left base. Heart is mildly enlarged with pulmonary vascularity within normal limits. Pacemaker leads are attached to the right atrium and middle cardiac vein. Patient is status post coronary  artery bypass grafting. No adenopathy. No bone lesions. IMPRESSION: Mild cardiomegaly with pleural effusions bilaterally. Airspace opacity right mid lower lung zones, suspicious for pneumonia. Left base atelectasis. Pacemaker leads attached to right atrium and middle cardiac vein. Electronically Signed   By: Lowella Grip III M.D.   On: 03/29/2017 13:17    Scheduled Meds: . amiodarone  200 mg Oral Daily  . atorvastatin  10 mg Oral q1800  . carvedilol  12.5 mg Oral BID  . escitalopram  10 mg Oral Daily  . gabapentin  300 mg Oral QHS  . insulin NPH Human  15 Units Subcutaneous QAC breakfast  . levothyroxine  88 mcg Oral QAC breakfast  . sodium chloride flush  3 mL Intravenous Q12H  . warfarin  2.5 mg Oral q1800  . Warfarin - Physician Dosing Inpatient   Does not apply q1800    Continuous Infusions: . sodium chloride    . azithromycin    . cefTRIAXone (ROCEPHIN)  IV       LOS: 1 day     Kayleen Memos, MD [facility] Pager [phone]  If 7PM-7AM, please contact night-coverage [email or web address] Password [email or web address] 03/30/2017, 8:02 AM

## 2017-03-30 NOTE — Evaluation (Signed)
Clinical/Bedside Swallow Evaluation Patient Details  Name: Destiny [name] MRN: [id number] Date of Birth: 1940/10/28  Today's Date: 03/30/2017 Time: SLP Start Time (ACUTE ONLY): 1025 SLP Stop Time (ACUTE ONLY): 1040 SLP Time Calculation (min) (ACUTE ONLY): 15 min  Past Medical History:  Past Medical History:  Diagnosis Date  . AICD (automatic cardioverter/defibrillator) present   . Anemia, unspecified 12/13/2013  . Back pain, chronic    "just when I walk; mass on 3rd and 4th vertebrae right lower back"  . Biventricular implantable cardiac defibrillator in situ 2007, 2012   a. 2007;  b. 2012 Gen change: SJM 3231-40 Uni BiV ICD, ser # [id number].  Marland Kitchen CAD (coronary artery disease)    a. 05/2001 CABG x4: LIMA->LAD, VG->D1, VG->D2, VG->RCA.  . Cardiomyopathy, ischemic    a. 2012 s/p SJM 3231-40 Uni BiV ICD, ser # [id number].  . Carotid stenosis    a. 10/19/2011 carotid duplex - Mild hard plaque bilaterally. Stable 40-59% bilateral ICA stenosis. Carotid US (04/2013):  Bilateral 40-59% ICA.  F/u 1 year  . Cerumen impaction 10/28/2015  . Chronic combined systolic and diastolic CHF (congestive heart failure) (HCC)    a. EF as low as 25% in 2006;  b. EF 60-65% in 12/2011;  c. 02/2013 Echo: EF 35-40, mod mid-dist antsept HK, Gr 2 DD, mild LVH.  . CKD (chronic kidney disease), stage IV ([place])    hx/notes 09/04/2016  . COPD (chronic obstructive pulmonary disease) ([place])   . COPD, severe ([place]) 01/22/2016  . 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 Dyslipidemia   . Gout ~ 08/2011  . History of vertebral fracture 09/18/2016  . Hypercalcemia 09/26/2013  . Hypertension   . Hypothyroidism   . Hypoxemia requiring supplemental oxygen   . LBBB (left bundle branch block)   . Lumbar compression fracture ([place])    L2/notes 09/04/2016  . Morbid obesity with BMI of 45.0-49.9, adult (HCC)    Ht. 5'4". BMI 47.2  . Mural thrombus of left ventricle    before 2003,  while on Coumadin, No h/o CVA  . 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 Benay Spice, in remission  . On home oxygen therapy    "normally 2-3L; 24/7" (12/14/2015); "2L; 24/7" (09/04/2016)  . OSA on CPAP   . PAF (paroxysmal atrial fibrillation) (HCC)    on Coumadin  . Presence of permanent cardiac pacemaker   . Retinopathy due to secondary diabetes ([place])    type II, uncontrolled  . Urinary incontinence 06/25/2016  . Vertebral fracture, osteoporotic ([place]) 08/27/2016  . Vitamin D deficiency 06/09/2012   historical   Past Surgical History:  Past Surgical History:  Procedure Laterality Date  . ABDOMINAL HYSTERECTOMY  1982  . CARDIAC CATHETERIZATION  06/03/01  . CARDIOVERSION N/A 11/25/2014   Performed by Evans Lance, MD at [facility] ENDOSCOPY  . CARDIOVERSION N/A 07/30/2014   Performed by Jolaine Artist, MD at [facility] ENDOSCOPY  . CATARACT EXTRACTION     left eye  . CHOLECYSTECTOMY  1970  . CORONARY ARTERY BYPASS GRAFT  2003   CABG X4  . INSERT / REPLACE / REMOVE PACEMAKER  2007; 2012   w/AICD  . IR KYPHO LUMBAR INC FX REDUCE BONE BX UNI/BIL CANNULATION INC/IMAGING  09/11/2016  . REFRACTIVE SURGERY     right eye  . TRANSESOPHAGEAL ECHOCARDIOGRAM (TEE) N/A 12/16/2015   Performed by Jolaine Artist, MD at [place]  .  TUBAL LIGATION  1972   HPI:  Destiny [name] a [id number] y.o.femalewith medical history significant ofchronic systolic and diastolic CHF with AICD, COPD on home oxygen of 2 L, CKD stage IV, anticoagulation with Coumadin with prior LV mural thrombus, and diabetes who presented to the emergency department with worsening shortness of breath that began approximately 2 days ago. Apparently she has been struggling with diarrhea for the last 2 weeks and she decided it would be best to discontinue her home torsemide on account of possible dehydration. CXR 03/29/17 revealed airspace opacity right mid lower lung zones, suspicious for pneumonia.    Assessment / Plan / Recommendation Clinical Impression  Pt presents with oropharyngeal swallow appearing within functional limits with adequate airway protection at bedside. No overt signs of aspiration observed despite challenging with consecutive straw sips of thin liquids. Pt's respiratory rate stable in low-mid 20s throughout assessment, though dyspnea noted at baseline. Despite this, respiratory/swallow coordination appears adequate. Xerostomia noted, which does appear to result in prolonged mastication/clearance of solids. Pt clears with liquid wash. She is at mild risk for aspiration due to her respiratory status; educated pt re: general aspiration precautions, avoiding PO intake if respiratory rate is elevated (above 30). Recommend regular diet with thin liquids, meds whole with liquid. No further skilled ST needs identified; will s/o.  SLP Visit Diagnosis: Dysphagia, unspecified (R13.10)    Aspiration Risk  Mild aspiration risk    Diet Recommendation Regular;Thin liquid   Liquid Administration via: Cup;Straw Medication Administration: Whole meds with liquid Supervision: Patient able to self feed Compensations: Slow rate;Small sips/bites    Other  Recommendations Oral Care Recommendations: Oral care BID   Follow up Recommendations None      Frequency and Duration            Prognosis Prognosis for Safe Diet Advancement: Good      Swallow Study   General Date of Onset: 03/29/17 HPI: Destiny [name] a [id number] y.o.femalewith medical history significant ofchronic systolic and diastolic CHF with AICD, COPD on home oxygen of 2 L, CKD stage IV, anticoagulation with Coumadin with prior LV mural thrombus, and diabetes who presented to the emergency department with worsening shortness of breath that began approximately 2 days ago. Apparently she has been struggling with diarrhea for the last 2 weeks and she decided it would be best to discontinue her home torsemide on account  of possible dehydration. CXR 03/29/17 revealed airspace opacity right mid lower lung zones, suspicious for pneumonia. Type of Study: Bedside Swallow Evaluation Previous Swallow Assessment: none in chart Diet Prior to this Study: Regular;Thin liquids Temperature Spikes Noted: No Respiratory Status: Nasal cannula History of Recent Intubation: No Behavior/Cognition: Alert;Cooperative Oral Cavity Assessment: Dry Oral Care Completed by SLP: No Oral Cavity - Dentition: Adequate natural dentition Vision: Functional for self-feeding Self-Feeding Abilities: Able to feed self Patient Positioning: Upright in bed Baseline Vocal Quality: Normal Volitional Cough: Strong Volitional Swallow: Unable to elicit(xerostomia)    Oral/Motor/Sensory Function Overall Oral Motor/Sensory Function: Within functional limits   Ice Chips Ice chips: Within functional limits   Thin Liquid Thin Liquid: Within functional limits Presentation: Cup;Self Fed;Straw    Nectar Thick Nectar Thick Liquid: Not tested   Honey Thick Honey Thick Liquid: Not tested   Puree Puree: Within functional limits Presentation: Self Fed;Spoon   Solid   GO   Solid: Within functional limits Other Comments: mildly prolonged mastication, suspect due to xerostomia       Deneise Lever, MS, CCC-SLP Speech-Language  Pathologist [place] 03/30/2017,11:19 AM

## 2017-03-31 ENCOUNTER — Inpatient Hospital Stay (HOSPITAL_COMMUNITY): Payer: Medicare Other

## 2017-03-31 ENCOUNTER — Other Ambulatory Visit: Payer: Self-pay

## 2017-03-31 DIAGNOSIS — I361 Nonrheumatic tricuspid (valve) insufficiency: Secondary | ICD-10-CM

## 2017-03-31 LAB — CBC
HCT: 33 % — ABNORMAL LOW (ref 36.0–46.0)
Hemoglobin: 10.5 g/dL — ABNORMAL LOW (ref 12.0–15.0)
MCH: 29 pg (ref 26.0–34.0)
MCHC: 31.8 g/dL (ref 30.0–36.0)
MCV: 91.2 fL (ref 78.0–100.0)
Platelets: 104 10*3/uL — ABNORMAL LOW (ref 150–400)
RBC: 3.62 MIL/uL — ABNORMAL LOW (ref 3.87–5.11)
RDW: 14.9 % (ref 11.5–15.5)
WBC: 6.4 10*3/uL (ref 4.0–10.5)

## 2017-03-31 LAB — BASIC METABOLIC PANEL
Anion gap: 9 (ref 5–15)
BUN: 66 mg/dL — AB (ref 6–20)
CALCIUM: 8.4 mg/dL — AB (ref 8.9–10.3)
CO2: 23 mmol/L (ref 22–32)
CREATININE: 3.33 mg/dL — AB (ref 0.44–1.00)
Chloride: 99 mmol/L — ABNORMAL LOW (ref 101–111)
GFR calc Af Amer: 15 mL/min — ABNORMAL LOW (ref >60)
GFR, EST NON AFRICAN AMERICAN: 13 mL/min — AB (ref >60)
GLUCOSE: 137 mg/dL — AB (ref 65–99)
Potassium: 5.3 mmol/L — ABNORMAL HIGH (ref 3.5–5.1)
Sodium: 131 mmol/L — ABNORMAL LOW (ref 135–145)

## 2017-03-31 LAB — GLUCOSE, CAPILLARY
Glucose-Capillary: 123 mg/dL — ABNORMAL HIGH (ref 65–99)
Glucose-Capillary: 185 mg/dL — ABNORMAL HIGH (ref 65–99)

## 2017-03-31 LAB — ECHOCARDIOGRAM COMPLETE
Height: 64 in
Weight: 3809.55 oz

## 2017-03-31 LAB — PROTIME-INR
INR: 2.96
PROTHROMBIN TIME: 30.6 s — AB (ref 11.4–15.2)

## 2017-03-31 LAB — C DIFFICILE QUICK SCREEN W PCR REFLEX
C DIFFICLE (CDIFF) ANTIGEN: NEGATIVE
C Diff interpretation: NOT DETECTED
C Diff toxin: NEGATIVE

## 2017-03-31 MED ORDER — SODIUM POLYSTYRENE SULFONATE 15 GM/60ML PO SUSP
15.0000 g | Freq: Once | ORAL | Status: AC
  Administered 2017-03-31: 15 g via ORAL
  Filled 2017-03-31: qty 60

## 2017-03-31 MED ORDER — FUROSEMIDE 10 MG/ML IJ SOLN
40.0000 mg | Freq: Once | INTRAMUSCULAR | Status: AC
  Administered 2017-03-31: 40 mg via INTRAVENOUS
  Filled 2017-03-31: qty 4

## 2017-03-31 MED ORDER — AZITHROMYCIN 500 MG PO TABS
500.0000 mg | ORAL_TABLET | Freq: Every day | ORAL | Status: DC
  Administered 2017-03-31 – 2017-04-02 (×3): 500 mg via ORAL
  Filled 2017-03-31 (×4): qty 1

## 2017-03-31 NOTE — Progress Notes (Signed)
Text message sent to Dr [place] Crane husband states patient says was scheduled for Pacemaker replacement and to hold Comadin tonight tomorrow,  Please advise.

## 2017-03-31 NOTE — Plan of Care (Signed)
Discussed with patient and family plan of care for the evening, pain management and importance of using the bedside commode for bowel movements and full bladder voiding with some teach back displayed.

## 2017-03-31 NOTE — Progress Notes (Signed)
Pharmacist Heart Failure Core Measure Documentation  Assessment: Destiny [name] has an EF documented as 25-30% on TTE by 03/31/17.  Rationale: Heart failure patients with left ventricular systolic dysfunction (LVSD) and an EF < 40% should be prescribed an angiotensin converting enzyme inhibitor (ACEI) or angiotensin receptor blocker (ARB) at discharge unless a contraindication is documented in the medical record.  This patient is not currently on an ACEI or ARB for HF.  This note is being placed in the record in order to provide documentation that a contraindication to the use of these agents is present for this encounter.  ACE Inhibitor or Angiotensin Receptor Blocker is contraindicated (specify all that apply)  []   ACEI allergy AND ARB allergy []   Angioedema []   Moderate or severe aortic stenosis []   Hyperkalemia []   Hypotension []   Renal artery stenosis [x]   Worsening renal function, preexisting renal disease or dysfunction   Lawson Radar 03/31/2017 2:47 PM

## 2017-03-31 NOTE — Progress Notes (Signed)
PROGRESS NOTE  Destiny FRIESZ MVE:[id number] DOB: 06-21-1940 DOA: 03/29/2017 PCP: Mosie Lukes, MD  HPI/Recap of past 24 hours: Destiny [name] is a obese 76 y.o. 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  Per her husband, she had diarrhea at home for about 2 weeks which has resolved since she has been in the hospital.   There were no acute events reported overnight. Passed swallow evaluation by speech therapist.  This morning the patient stated that her breathing is improved.  Patient is anuric and renal function is worsening. Will contact nephrology to evaluate.  Assessment/Plan: Principal Problem:   Acute on chronic combined systolic and diastolic CHF (congestive heart failure) (HCC) Active Problems:   Diabetes mellitus type 2 with retinopathy ([place])   Hyperlipidemia   Essential hypertension   Automatic implantable cardioverter-defibrillator in situ   Hypothyroidism   Anemia   Morbid obesity (HCC)   COPD, severe (HCC)   CKD (chronic kidney disease) stage 4, GFR 15-29 ml/min ([place])   Community acquired pneumonia   Acute hypoxemic respiratory failure (HCC)   Pressure injury of skin  Acute on chronic hypoxic respiratory failure most likely 2/2 to CAP, present on admission vs acute on chronic combined systolic and diastolic CHF-likely due to medication noncompliance -Lasix IV for diuresis x 1 dose -hold home torsemide due to advanced CKD -EF 25-30% (10/20/14) post biv AICD -Repeat echo -BNP 1158 (03/29/17) from 829 (01/01/16) -On 2-3L O2 continuously at home and BIPAP at night -Continue O2 supplement to maintain O2 saturation 92% or greater -BIPAP at night  Community-acquired pneumonia, present on  admission -Cultures blood x2 no growth 2 days -IV Rocephin and IV azithromycin for empiric coverage day#2 - MRSA negative  Hyperkalemia in the setting of advanced chronic kidney disease -kayexalate 15 gm once -K+ 5.3 -bmp am  Asymptomatic pyuria, present on admission -U/A positive 03/29/17 -Denies symptoms -afebrile, no leukocytosis, lactic acid 1.2  COPD on home oxygen - Continue oxygen and wean as tolerated to baseline of 2-3 L nasal cannula - No active wheezing noted, DuoNeb's q 6 hours, hypersaline nebs daily  CKD stage IV -creatinine 3.33 from 3.03 3.62 from 3,84 -Baseline creatinine between 3 and 3.5 -Assess response to diuresis with Lasix; may need nephrology consultation if patient does not diuresed well -closely monitor urine output -Follows with nephrology as outpatient  Normocytic anemia Most likely 2/2 to advanced chronic kidney disease -stable 10.5 from 11.2 -no sign of obvious bleed -cbc am  Insulin dependent DMII -Continue current home insulin regimen -Carb modified diet -hypoglycemic protocol as needed  Prior mural thrombus to LV -On Coumadin for anticoagulation; therapeutic INR 2.96 -INR in AM   Recent diarrhea -Stool studies with Cdiff negative  Depression/anxiety -stable -escitalopram  Essential HTN -stable -amiodarone, carvedilol  Diabetic polyneuropathy -stable -gabapentin  Code Status: Full  Family Communication: Spouse at bedside. All questions answered to his satisfaction  Disposition Plan: Will stay another midnight to continue treatment for CAP, present on admission and suspected acute pulmonary edema.   Consultants:  None  Procedures:  None  Antimicrobials:  IV azithromycin and IV ceftriaxone, day#2  DVT prophylaxis:  Therapeutic on warfarin   Objective: Vitals:   03/31/17 0500 03/31/17 0600 03/31/17 0700 03/31/17 0743  BP: 108/74 125/69 (!) 116/51   Pulse: 67 69 72 71  Resp: (!) 22 19 18    Temp:     98.1 F (36.7 C)  TempSrc:    Oral  SpO2: 96% 97% 97%   Weight:      Height:        Intake/Output Summary (Last 24 hours) at 03/31/2017 0812 Last data filed at 03/31/2017 0600 Gross per 24 hour  Intake 670 ml  Output 350 ml  Net 320 ml   Filed Weights   03/29/17 1842 03/30/17 0406 03/31/17 0110  Weight: 107.9 kg (237 lb 14 oz) 107.9 kg (237 lb 14 oz) 108 kg (238 lb 1.6 oz)    Exam:   General:  77 yo CF WD WN on nasal cannula 3L in NAD  Cardiovascular: RRR with no rubs or gallop  Respiratory: Mild iffused crackles bilaterally. No wheezes noted  Abdomen: Obese soft NT ND NBS x4 quadrants.  Musculoskeletal: 2/4 pulses in all 4 extremities  Skin: pressure ulcer, present on admission  Psychiatry: Mood is appropriate for condition and setting.   Data Reviewed: CBC: Recent Labs  Lab 03/27/17 1224 03/27/17 1559 03/29/17 0947 03/31/17 0338  WBC 5.3 4.8 6.6 6.4  NEUTROABS  --  3.1  --   --   HGB 11.3* 11.5 11.2* 10.5*  HCT 35.7* 35.4 35.2* 33.0*  MCV 93.2 93 91.7 91.2  PLT 89* 96* 94* 671*   Basic Metabolic Panel: Recent Labs  Lab 03/27/17 1224 03/27/17 1559 03/29/17 0947 03/30/17 0411 03/31/17 0338  NA 134* 137 132* 133* 131*  K 4.8 4.9 5.6* 5.1 5.3*  CL 104 103 102 101 99*  CO2 24 24 24 23 23   GLUCOSE 141* 137* 169* 118* 137*  BUN 57* 59* 60* 61* 66*  CREATININE 3.18* 3.01* 3.10* 3.01* 3.33*  CALCIUM 8.6* 8.7 8.5* 8.5* 8.4*  PHOS 2.8  --   --   --   --    GFR: Estimated Creatinine Clearance: 17.8 mL/min (A) (by C-G formula based on SCr of 3.33 mg/dL (H)). Liver Function Tests: Recent Labs  Lab 03/27/17 1224  ALBUMIN 2.8*   No results for input(s): LIPASE, AMYLASE in the last 168 hours. No results for input(s): AMMONIA in the last 168 hours. Coagulation Profile: Recent Labs  Lab 03/27/17 1559 03/29/17 2124 03/30/17 0411 03/31/17 0338  INR 2.8* 2.71 2.73 2.96   Cardiac Enzymes: No results for input(s): CKTOTAL, CKMB, CKMBINDEX,  TROPONINI in the last 168 hours. BNP (last 3 results) No results for input(s): PROBNP in the last 8760 hours. HbA1C: No results for input(s): HGBA1C in the last 72 hours. CBG: Recent Labs  Lab 03/30/17 0821 03/30/17 1213 03/30/17 1708 03/30/17 2220 03/31/17 0743  GLUCAP 141* 168* 151* 151* 123*   Lipid Profile: No results for input(s): CHOL, HDL, LDLCALC, TRIG, CHOLHDL, LDLDIRECT in the last 72 hours. Thyroid Function Tests: No results for input(s): TSH, T4TOTAL, FREET4, T3FREE, THYROIDAB in the last 72 hours. Anemia Panel: No results for input(s): VITAMINB12, FOLATE, FERRITIN, TIBC, IRON, RETICCTPCT in the last 72 hours. Urine analysis:    Component Value Date/Time   COLORURINE YELLOW 03/29/2017 2348   APPEARANCEUR HAZY (A) 03/29/2017 2348   LABSPEC 1.013 03/29/2017 2348   PHURINE 5.0 03/29/2017 2348   GLUCOSEU NEGATIVE 03/29/2017 2348   GLUCOSEU NEGATIVE 06/25/2016 1002   HGBUR NEGATIVE 03/29/2017 2348   HGBUR large 05/17/2008 [place] 03/29/2017 [place] 03/29/2017 2348   PROTEINUR  NEGATIVE 03/29/2017 2348   UROBILINOGEN 0.2 06/25/2016 1002   NITRITE NEGATIVE 03/29/2017 2348   LEUKOCYTESUR LARGE (A) 03/29/2017 2348   Sepsis Labs: @LABRCNTIP (procalcitonin:4,lacticidven:4)  ) Recent Results (from the past 240 hour(s))  Culture, blood (routine x 2) Call MD if unable to obtain prior to antibiotics being given     Status: None (Preliminary result)   Collection Time: 03/29/17  3:45 PM  Result Value Ref Range Status   Specimen Description BLOOD LEFT ANTECUBITAL  Final   Special Requests   Final    BOTTLES DRAWN AEROBIC AND ANAEROBIC Blood Culture adequate volume   Culture NO GROWTH 1 DAY  Final   Report Status PENDING  Incomplete  Culture, blood (routine x 2) Call MD if unable to obtain prior to antibiotics being given     Status: None (Preliminary result)   Collection Time: 03/29/17  4:10 PM  Result Value Ref Range Status    Specimen Description BLOOD RIGHT ARM  Final   Special Requests   Final    BOTTLES DRAWN AEROBIC AND ANAEROBIC Blood Culture adequate volume   Culture NO GROWTH 1 DAY  Final   Report Status PENDING  Incomplete  MRSA PCR Screening     Status: None   Collection Time: 03/29/17  6:46 PM  Result Value Ref Range Status   MRSA by PCR NEGATIVE NEGATIVE Final    Comment:        The GeneXpert MRSA Assay (FDA approved for NASAL specimens only), is one component of a comprehensive MRSA colonization surveillance program. It is not intended to diagnose MRSA infection nor to guide or monitor treatment for MRSA infections.       Studies: No results found.  Scheduled Meds: . amiodarone  200 mg Oral Daily  . atorvastatin  10 mg Oral q1800  . carvedilol  12.5 mg Oral BID  . escitalopram  10 mg Oral Daily  . gabapentin  300 mg Oral QHS  . insulin NPH Human  15 Units Subcutaneous QAC breakfast  . levothyroxine  88 mcg Oral QAC breakfast  . oxymetazoline  1 spray Each Nare BID  . sodium chloride  3 mL Nebulization BID  . sodium chloride flush  3 mL Intravenous Q12H  . [START ON 04/01/2017] torsemide  20 mg Oral Once per day on Mon Tue Wed Thu  . torsemide  40 mg Oral Once per day on Sun Fri Sat  . warfarin  2.5 mg Oral q1800  . Warfarin - Physician Dosing Inpatient   Does not apply q1800    Continuous Infusions: . sodium chloride    . azithromycin Stopped (03/30/17 1844)  . cefTRIAXone (ROCEPHIN)  IV Stopped (03/30/17 1808)     LOS: 2 days     Kayleen Memos, MD [facility] Pager [phone]  If 7PM-7AM, please contact night-coverage [email or web address] Password [email or web address] 03/31/2017, 8:12 AM

## 2017-03-31 NOTE — Progress Notes (Signed)
Called earlier this am to restart iv;  Pt was up in the chair eating breakfast; pt and her husband requested IV Team come back later;   Pt having ECHO done at bedside at present;  Will be at least another 30 minutes per tech;   Will return again when able;    Raynelle Fanning RN IVTeam

## 2017-03-31 NOTE — Progress Notes (Signed)
PHARMACIST - PHYSICIAN COMMUNICATION DR:   [place] Crane CONCERNING: Antibiotic IV to Oral Route Change Policy  RECOMMENDATION: This patient is receiving Azithromycin by the intravenous route.  Based on criteria approved by the Pharmacy and Therapeutics Committee, the antibiotic(s) is/are being converted to the equivalent oral dose form(s).   DESCRIPTION: These criteria include:  Patient being treated for a respiratory tract infection, urinary tract infection, cellulitis or clostridium difficile associated diarrhea if on metronidazole  The patient is not neutropenic and does not exhibit a GI malabsorption state  The patient is eating (either orally or via tube) and/or has been taking other orally administered medications for a least 24 hours  The patient is improving clinically and has a Tmax < 100.5  If you have questions about this conversion, please contact the Pharmacy Department  []   [phone] )  [facility] []   [phone] )  [facility] [x]   [phone] )  [facility] []   [phone] )  [facility] []   [phone] )  [place], PharmD, BCPS Pager: [phone] 12:30 PM

## 2017-03-31 NOTE — Plan of Care (Signed)
Discussed with patient and family plan of care for the evening, pain management and good habits with oxygen , eating and coping with anxiety with some teach back displayed.

## 2017-03-31 NOTE — Progress Notes (Signed)
  Echocardiogram 2D Echocardiogram has been performed.  Destiny [name] T Destiny [name] 03/31/2017, 12:33 PM

## 2017-04-01 ENCOUNTER — Inpatient Hospital Stay (HOSPITAL_COMMUNITY): Payer: Medicare Other

## 2017-04-01 ENCOUNTER — Telehealth: Payer: Self-pay | Admitting: Internal Medicine

## 2017-04-01 DIAGNOSIS — R0902 Hypoxemia: Secondary | ICD-10-CM

## 2017-04-01 DIAGNOSIS — Z4502 Encounter for adjustment and management of automatic implantable cardiac defibrillator: Secondary | ICD-10-CM

## 2017-04-01 LAB — BASIC METABOLIC PANEL
Anion gap: 11 (ref 5–15)
BUN: 69 mg/dL — ABNORMAL HIGH (ref 6–20)
CHLORIDE: 101 mmol/L (ref 101–111)
CO2: 20 mmol/L — ABNORMAL LOW (ref 22–32)
Calcium: 8.6 mg/dL — ABNORMAL LOW (ref 8.9–10.3)
Creatinine, Ser: 3.6 mg/dL — ABNORMAL HIGH (ref 0.44–1.00)
GFR calc Af Amer: 13 mL/min — ABNORMAL LOW (ref >60)
GFR calc non Af Amer: 12 mL/min — ABNORMAL LOW (ref >60)
Glucose, Bld: 113 mg/dL — ABNORMAL HIGH (ref 65–99)
POTASSIUM: 4.9 mmol/L (ref 3.5–5.1)
Sodium: 132 mmol/L — ABNORMAL LOW (ref 135–145)

## 2017-04-01 LAB — LEGIONELLA PNEUMOPHILA SEROGP 1 UR AG: L. pneumophila Serogp 1 Ur Ag: NEGATIVE

## 2017-04-01 LAB — CBC
HEMATOCRIT: 35.2 % — AB (ref 36.0–46.0)
Hemoglobin: 11.6 g/dL — ABNORMAL LOW (ref 12.0–15.0)
MCH: 29.8 pg (ref 26.0–34.0)
MCHC: 33 g/dL (ref 30.0–36.0)
MCV: 90.5 fL (ref 78.0–100.0)
Platelets: 107 10*3/uL — ABNORMAL LOW (ref 150–400)
RBC: 3.89 MIL/uL (ref 3.87–5.11)
RDW: 15.3 % (ref 11.5–15.5)
WBC: 8.4 10*3/uL (ref 4.0–10.5)

## 2017-04-01 LAB — PROTIME-INR
INR: 3.53
Prothrombin Time: 35.1 seconds — ABNORMAL HIGH (ref 11.4–15.2)

## 2017-04-01 MED ORDER — IPRATROPIUM-ALBUTEROL 0.5-2.5 (3) MG/3ML IN SOLN
3.0000 mL | Freq: Four times a day (QID) | RESPIRATORY_TRACT | Status: DC
  Administered 2017-04-01 – 2017-04-19 (×64): 3 mL via RESPIRATORY_TRACT
  Filled 2017-04-01 (×66): qty 3

## 2017-04-01 MED ORDER — OXYMETAZOLINE HCL 0.05 % NA SOLN
1.0000 | Freq: Two times a day (BID) | NASAL | Status: DC | PRN
  Administered 2017-04-02: 1 via NASAL
  Filled 2017-04-01: qty 15

## 2017-04-01 MED ORDER — METHYLPREDNISOLONE SODIUM SUCC 40 MG IJ SOLR
40.0000 mg | Freq: Two times a day (BID) | INTRAMUSCULAR | Status: AC
  Administered 2017-04-01 – 2017-04-03 (×4): 40 mg via INTRAVENOUS
  Filled 2017-04-01 (×4): qty 1

## 2017-04-01 MED ORDER — SALINE SPRAY 0.65 % NA SOLN
1.0000 | NASAL | Status: DC | PRN
  Administered 2017-04-07 – 2017-04-12 (×2): 1 via NASAL
  Filled 2017-04-01: qty 44

## 2017-04-01 MED ORDER — OXYMETAZOLINE HCL 0.05 % NA SOLN: 1.0000 | Freq: Two times a day (BID) | NASAL | Status: DC

## 2017-04-01 NOTE — Telephone Encounter (Signed)
Trish, hospital cardmaster, will ask Dr Curt Bears to check on this, pt's husband aware.

## 2017-04-01 NOTE — Telephone Encounter (Signed)
Trish to notify Dr [place] Crane re message .Adonis Housekeeper

## 2017-04-01 NOTE — Telephone Encounter (Signed)
New Message     Pt to have pacemaker replaced tomorrow   Are they to stop her coumadin?

## 2017-04-01 NOTE — Telephone Encounter (Signed)
New Message  Pt husband call requesting to speak with RN. He states pt is currently in the hospital. He states that a Dr. [place] Crane from [facility] has tried to contact Dr. Lovena Le about pts procedure on 11/20. Please call back to discuss

## 2017-04-01 NOTE — Progress Notes (Cosign Needed)
EP service is made aware of patient's current admission for pneumonia.  Device reached ERI 02/21/17, generator replacement is not urgent at this time, and I anticipate we will reschedule her procedure given current hospitalization.  No need to hold coumadin tonight regardless, procedure can be done with therapeutic INR.   I will make Dr. Lovena Le aware Tommye Standard, PA-C

## 2017-04-01 NOTE — Progress Notes (Signed)
Placed pt on home bipap unit. RT will continue to monitor.

## 2017-04-01 NOTE — Progress Notes (Signed)
Physician notified: [place] Crane, C At: 1456  Regarding: pt had bloody nose, hemostasis achieved after two tissues. OK for sea Breeze nasal spray? on bubbler for O2 and afrin.  Awaiting return response.   Returned Response at: 1457  Order(s): DC afrin, order PRN saline nasal spray

## 2017-04-01 NOTE — Progress Notes (Signed)
PROGRESS NOTE  Destiny [name] RAQ:[id number] DOB: Apr 28, 1941 DOA: 03/29/2017 PCP: Mosie Lukes, MD  HPI/Recap of past 24 hours: Destiny [name] is a obese 76 y.o. 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  Per her husband, she had diarrhea at home for about 2 weeks which has resolved since she has been in the hospital.   There were no acute events reported overnight.  Pt was seen and examined with her husband at her bedside. She reports her breathing is improved. However she feels significantly short of breath when she moves from the bed to the chair. Last 2D echo LVEF 25-30% (03/31/17). Has biv AICD in place.   Patient reports pacemaker replacement planned 04/02/17. The Probation officer confirmed with Dr Tanna Furry office. Called the cardiologist on call who will evaluate the pt before making a decision to hold coumadin.   Assessment/Plan: Principal Problem:   Acute on chronic combined systolic and diastolic CHF (congestive heart failure) (HCC) Active Problems:   Diabetes mellitus type 2 with retinopathy ([place])   Hyperlipidemia   Essential hypertension   Automatic implantable cardioverter-defibrillator in situ   Hypothyroidism   Anemia   Morbid obesity (HCC)   COPD, severe (HCC)   CKD (chronic kidney disease) stage 4, GFR 15-29 ml/min ([place])   Community acquired pneumonia   Acute hypoxemic respiratory failure (HCC)   Pressure injury of skin  Acute on chronic hypoxic respiratory failure most likely 2/2 to CAP, present on admission vs acute on chronic combined systolic and diastolic CHF-likely due to medication noncompliance -Lasix IV for diuresis x 1 dose -hold home torsemide due to advanced CKD and worsening -defer to  cardiology for diuresis -EF 25-30% 03/31/17 and 10/20/14 post biv AICD -BNP 1158 (03/29/17) from 829 (01/01/16) -On 2-3L O2 continuously at home and BIPAP at night -Continue O2 supplement to maintain O2 saturation 92% or greater -BIPAP at night  Heart failure with reduced EF 25-30% post Biv AICD -last echo 03/31/17 LVEF 25-30% -Reported planned AICD replacement 04/02/17 -confirmed by Dr Tanna Furry office -cardiology consulted 04/01/17. We appreciate recommendations -asa, statin, beta blocker  Community-acquired pneumonia, present on admission -Cultures blood x2 no growth 3 days -IV Rocephin and IV azithromycin for empiric coverage day#3 - MRSA negative  Hyperkalemia in the setting of advanced chronic kidney disease -resolved -received kayexalate 15 gm once 03/31/17 -K+ 4.9 from 5.3 -bmp am  Asymptomatic pyuria, present on admission -U/A positive 03/29/17 -Denies symptoms -afebrile, no leukocytosis, lactic acid 1.2  COPD on home oxygen - Continue oxygen and wean as tolerated to baseline of 2-3 L nasal cannula - No active wheezing noted, DuoNeb's q 6 hours, hypersaline nebs daily - IV solumedrol 40 mg BID x 4 doses starting 04/01/17  CKD stage IV -creatinine worsening cr 3.60 from 3.33 from 3.03 3.62 from 3.84 -Baseline creatinine 3.5 -nephrology consultation if patient does not diuresed well -closely monitor urine output -Follows with nephrology as outpatient  Normocytic anemia Most likely 2/2 to advanced chronic kidney disease -stable 11.6 from 10.5 from 11.2 -no sign of obvious bleed -cbc am  Insulin dependent DMII -Continue current home insulin regimen -Carb modified diet -hypoglycemic protocol as needed  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 All questions answered to his satisfaction  Disposition Plan: Will stay another midnight to continue treatment for CAP, present on admission and suspected acute pulmonary edema.   Consultants:  None  Procedures:  None  Antimicrobials:  IV azithromycin and IV ceftriaxone, day#3  DVT prophylaxis:  Therapeutic on warfarin   Objective: Vitals:   04/01/17 0300 04/01/17 0400 04/01/17 0402 04/01/17 0500  BP:   (!) 100/57   Pulse: 68 71  70  Resp: 20 19  16   Temp:  (!) 97.4 F (36.3 C)    TempSrc:  Axillary    SpO2: 92% 90%  91%  Weight:   109.4 kg (241 lb 2.9 oz)   Height:        Intake/Output Summary (Last 24 hours) at 04/01/2017 0824 Last data filed at 04/01/2017 0400 Gross per 24 hour  Intake 280 ml  Output 401 ml  Net -121 ml   Filed Weights   03/30/17 0406 03/31/17 0110 04/01/17 0402  Weight: 107.9 kg (237 lb 14 oz) 108 kg (238 lb 1.6 oz) 109.4 kg (241 lb 2.9 oz)    Exam:   General:  77 yo CF WD WN on nasal cannula 3L in NAD  Cardiovascular: RRR with no rubs or gallop  Respiratory: Mild diffused crackles bilaterally. No wheezes noted  Abdomen: Obese soft NT ND NBS x4 quadrants.  Musculoskeletal: 2/4 pulses in all 4 extremities  Skin: pressure ulcer, present on admission  Psychiatry: Mood is appropriate for condition and setting.   Data Reviewed: CBC: Recent Labs  Lab 03/27/17 1224 03/27/17 1559 03/29/17 0947 03/31/17 0338 04/01/17 0508  WBC 5.3 4.8 6.6 6.4 8.4  NEUTROABS  --  3.1  --   --   --   HGB 11.3* 11.5 11.2* 10.5* 11.6*  HCT 35.7* 35.4 35.2* 33.0* 35.2*  MCV 93.2 93 91.7 91.2 90.5  PLT 89* 96* 94* 104* 408*   Basic Metabolic Panel: Recent Labs  Lab 03/27/17 1224 03/27/17 1559 03/29/17 0947 03/30/17 0411 03/31/17 0338 04/01/17 0508  NA 134* 137 132* 133* 131* 132*  K 4.8 4.9 5.6* 5.1 5.3* 4.9  CL  104 103 102 101 99* 101  CO2 24 24 24 23 23  20*  GLUCOSE 141* 137* 169* 118* 137* 113*  BUN 57* 59* 60* 61* 66* 69*  CREATININE 3.18* 3.01* 3.10* 3.01* 3.33* 3.60*  CALCIUM 8.6* 8.7 8.5* 8.5* 8.4* 8.6*  PHOS 2.8  --   --   --   --   --    GFR: Estimated Creatinine Clearance: 16.6 mL/min (A) (by C-G formula based on SCr of 3.6 mg/dL (H)). Liver Function Tests: Recent Labs  Lab 03/27/17 1224  ALBUMIN 2.8*   No results for input(s): LIPASE, AMYLASE in the last 168 hours. No results for input(s): AMMONIA in the last 168 hours. Coagulation Profile: Recent Labs  Lab 03/27/17 1559 03/29/17 2124 03/30/17 0411 03/31/17 0338  INR 2.8* 2.71 2.73 2.96   Cardiac Enzymes: No results for input(s): CKTOTAL, CKMB, CKMBINDEX, TROPONINI in the last 168 hours. BNP (last 3 results) No results for input(s): PROBNP in the last 8760 hours. HbA1C: No results for input(s): HGBA1C in the last 72 hours. CBG: Recent Labs  Lab 03/30/17 1213 03/30/17 1708 03/30/17 2220 03/31/17 0743 03/31/17 1400  GLUCAP 168* 151* 151* 123*  185*   Lipid Profile: No results for input(s): CHOL, HDL, LDLCALC, TRIG, CHOLHDL, LDLDIRECT in the last 72 hours. Thyroid Function Tests: No results for input(s): TSH, T4TOTAL, FREET4, T3FREE, THYROIDAB in the last 72 hours. Anemia Panel: No results for input(s): VITAMINB12, FOLATE, FERRITIN, TIBC, IRON, RETICCTPCT in the last 72 hours. Urine analysis:    Component Value Date/Time   COLORURINE YELLOW 03/29/2017 2348   APPEARANCEUR HAZY (A) 03/29/2017 2348   LABSPEC 1.013 03/29/2017 2348   PHURINE 5.0 03/29/2017 2348   GLUCOSEU NEGATIVE 03/29/2017 2348   GLUCOSEU NEGATIVE 06/25/2016 1002   HGBUR NEGATIVE 03/29/2017 2348   HGBUR large 05/17/2008 1325   BILIRUBINUR NEGATIVE 03/29/2017 2348   KETONESUR NEGATIVE 03/29/2017 2348   PROTEINUR NEGATIVE 03/29/2017 2348   UROBILINOGEN 0.2 06/25/2016 1002   NITRITE NEGATIVE 03/29/2017 2348   LEUKOCYTESUR LARGE (A) 03/29/2017  2348   Sepsis Labs: @LABRCNTIP (procalcitonin:4,lacticidven:4)  ) Recent Results (from the past 240 hour(s))  Culture, blood (routine x 2) Call MD if unable to obtain prior to antibiotics being given     Status: None (Preliminary result)   Collection Time: 03/29/17  3:45 PM  Result Value Ref Range Status   Specimen Description BLOOD LEFT ANTECUBITAL  Final   Special Requests   Final    BOTTLES DRAWN AEROBIC AND ANAEROBIC Blood Culture adequate volume   Culture NO GROWTH 2 DAYS  Final   Report Status PENDING  Incomplete  Culture, blood (routine x 2) Call MD if unable to obtain prior to antibiotics being given     Status: None (Preliminary result)   Collection Time: 03/29/17  4:10 PM  Result Value Ref Range Status   Specimen Description BLOOD RIGHT ARM  Final   Special Requests   Final    BOTTLES DRAWN AEROBIC AND ANAEROBIC Blood Culture adequate volume   Culture NO GROWTH 2 DAYS  Final   Report Status PENDING  Incomplete  MRSA PCR Screening     Status: None   Collection Time: 03/29/17  6:46 PM  Result Value Ref Range Status   MRSA by PCR NEGATIVE NEGATIVE Final    Comment:        The GeneXpert MRSA Assay (FDA approved for NASAL specimens only), is one component of a comprehensive MRSA colonization surveillance program. It is not intended to diagnose MRSA infection nor to guide or monitor treatment for MRSA infections.   C difficile quick scan w PCR reflex     Status: None   Collection Time: 03/30/17 11:39 PM  Result Value Ref Range Status   C Diff antigen NEGATIVE NEGATIVE Final   C Diff toxin NEGATIVE NEGATIVE Final   C Diff interpretation No C. difficile detected.  Final      Studies: No results found.  Scheduled Meds: . amiodarone  200 mg Oral Daily  . atorvastatin  10 mg Oral q1800  . azithromycin  500 mg Oral Daily  . carvedilol  12.5 mg Oral BID  . escitalopram  10 mg Oral Daily  . gabapentin  300 mg Oral QHS  . insulin NPH Human  15 Units Subcutaneous QAC  breakfast  . levothyroxine  88 mcg Oral QAC breakfast  . oxymetazoline  1 spray Each Nare BID  . sodium chloride  3 mL Nebulization BID  . sodium chloride flush  3 mL Intravenous Q12H  . torsemide  20 mg Oral Once per day on Mon Tue Wed Thu  . warfarin  2.5 mg Oral q1800  . Warfarin - Physician  Dosing Inpatient   Does not apply q1800    Continuous Infusions: . sodium chloride    . cefTRIAXone (ROCEPHIN)  IV Stopped (03/31/17 1842)     LOS: 3 days     Kayleen Memos, MD [facility] Pager [phone]  If 7PM-7AM, please contact night-coverage [email or web address] Password [email or web address] 04/01/2017, 8:24 AM

## 2017-04-01 NOTE — Progress Notes (Signed)
RT asked pt if she wanted to wear her home bipap unit tonight, pt stated she did. Pt husband said he would place it on her whenever she was ready for bed. RT will continue to monitor. Marland Kitchen

## 2017-04-02 ENCOUNTER — Encounter (HOSPITAL_COMMUNITY)
Admission: EM | Disposition: A | Discharge status: Hospice | Payer: Self-pay | Source: Home / Self Care | Attending: Internal Medicine

## 2017-04-02 ENCOUNTER — Encounter (HOSPITAL_COMMUNITY): Payer: Self-pay | Admitting: Interventional Radiology

## 2017-04-02 ENCOUNTER — Inpatient Hospital Stay (HOSPITAL_COMMUNITY): Payer: Medicare Other

## 2017-04-02 ENCOUNTER — Ambulatory Visit (HOSPITAL_COMMUNITY): Admission: RE | Admit: 2017-04-02 | Payer: Medicare Other | Source: Ambulatory Visit | Admitting: Internal Medicine

## 2017-04-02 DIAGNOSIS — R34 Anuria and oliguria: Secondary | ICD-10-CM

## 2017-04-02 DIAGNOSIS — Z9889 Other specified postprocedural states: Secondary | ICD-10-CM

## 2017-04-02 DIAGNOSIS — I5043 Acute on chronic combined systolic (congestive) and diastolic (congestive) heart failure: Secondary | ICD-10-CM

## 2017-04-02 HISTORY — PX: IR THORACENTESIS ASP PLEURAL SPACE W/IMG GUIDE: IMG5380

## 2017-04-02 LAB — GLUCOSE, PLEURAL OR PERITONEAL FLUID: Glucose, Fluid: 222 mg/dL

## 2017-04-02 LAB — BASIC METABOLIC PANEL
ANION GAP: 9 (ref 5–15)
BUN: 74 mg/dL — ABNORMAL HIGH (ref 6–20)
CO2: 22 mmol/L (ref 22–32)
Calcium: 8.3 mg/dL — ABNORMAL LOW (ref 8.9–10.3)
Chloride: 99 mmol/L — ABNORMAL LOW (ref 101–111)
Creatinine, Ser: 3.65 mg/dL — ABNORMAL HIGH (ref 0.44–1.00)
GFR calc Af Amer: 13 mL/min — ABNORMAL LOW (ref >60)
GFR calc non Af Amer: 11 mL/min — ABNORMAL LOW (ref >60)
Glucose, Bld: 205 mg/dL — ABNORMAL HIGH (ref 65–99)
POTASSIUM: 5.1 mmol/L (ref 3.5–5.1)
SODIUM: 130 mmol/L — AB (ref 135–145)

## 2017-04-02 LAB — CBC
HEMATOCRIT: 32.6 % — AB (ref 36.0–46.0)
HEMOGLOBIN: 10.4 g/dL — AB (ref 12.0–15.0)
MCH: 28.7 pg (ref 26.0–34.0)
MCHC: 31.9 g/dL (ref 30.0–36.0)
MCV: 90.1 fL (ref 78.0–100.0)
Platelets: 85 10*3/uL — ABNORMAL LOW (ref 150–400)
RBC: 3.62 MIL/uL — ABNORMAL LOW (ref 3.87–5.11)
RDW: 14.8 % (ref 11.5–15.5)
WBC: 2.7 10*3/uL — ABNORMAL LOW (ref 4.0–10.5)

## 2017-04-02 LAB — PROTIME-INR
INR: 3.86
PROTHROMBIN TIME: 37.6 s — AB (ref 11.4–15.2)

## 2017-04-02 LAB — BODY FLUID CELL COUNT WITH DIFFERENTIAL
Eos, Fluid: 0 %
LYMPHS FL: 50 %
Monocyte-Macrophage-Serous Fluid: 35 % — ABNORMAL LOW (ref 50–90)
Neutrophil Count, Fluid: 15 % (ref 0–25)
OTHER CELLS FL: 0 %
WBC FLUID: 320 uL (ref 0–1000)

## 2017-04-02 LAB — GIARDIA/CRYPTOSPORIDIUM EIA
CRYPTOSPORIDIUM EIA: NEGATIVE
GIARDIA AG STL: NEGATIVE

## 2017-04-02 LAB — GLUCOSE, CAPILLARY
GLUCOSE-CAPILLARY: 130 mg/dL — AB (ref 65–99)
Glucose-Capillary: 216 mg/dL — ABNORMAL HIGH (ref 65–99)
Glucose-Capillary: 315 mg/dL — ABNORMAL HIGH (ref 65–99)

## 2017-04-02 LAB — GRAM STAIN: Gram Stain: NONE SEEN

## 2017-04-02 LAB — PROTEIN, PLEURAL OR PERITONEAL FLUID

## 2017-04-02 LAB — LACTATE DEHYDROGENASE, PLEURAL OR PERITONEAL FLUID: LD, Fluid: 64 U/L — ABNORMAL HIGH (ref 3–23)

## 2017-04-02 LAB — PROCALCITONIN: PROCALCITONIN: 0.22 ng/mL

## 2017-04-02 LAB — ALBUMIN, PLEURAL OR PERITONEAL FLUID: Albumin, Fluid: 1.2 g/dL

## 2017-04-02 SURGERY — BIV PACEMAKER REVISION

## 2017-04-02 MED ORDER — FUROSEMIDE 10 MG/ML IJ SOLN
60.0000 mg | Freq: Once | INTRAMUSCULAR | Status: AC
  Administered 2017-04-02: 60 mg via INTRAVENOUS
  Filled 2017-04-02: qty 6

## 2017-04-02 MED ORDER — LIDOCAINE HCL (PF) 2 % IJ SOLN
INTRAMUSCULAR | Status: AC | PRN
  Administered 2017-04-02: 10 mL

## 2017-04-02 MED ORDER — LIDOCAINE 2% (20 MG/ML) 5 ML SYRINGE
INTRAMUSCULAR | Status: AC
  Filled 2017-04-02: qty 10

## 2017-04-02 MED ORDER — WARFARIN - PHARMACIST DOSING INPATIENT: Freq: Every day | Status: DC

## 2017-04-02 NOTE — Progress Notes (Signed)
PROGRESS NOTE  Destiny [name] HYQ:[id number] DOB: [birth year] DOA: 03/29/2017 PCP: Mosie Lukes, MD  HPI/Recap of past 24 hours: Destiny [name] is a obese 76 y.o. 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  Per her husband, she had diarrhea at home for about 2 weeks which has resolved since she has been in the hospital.   There were no acute events reported overnight.  Pt was seen and examined with her husband at her bedside. She reports her breathing is improved. However she feels significantly short of breath when she moves from the bed to the chair. Last 2D echo LVEF 25-30% (03/31/17). Has biv AICD in place.   No recommendation to hold coumadin per the pt's cardiology team. Continue current management. Positive fluid balance. Lasix IV 60 mg once. Thoracentesis by IR planned today.  Pt denies chest pain. Dyspnea is the same worsened by movement in the bed or moving from bed to chair. Admits to intermittent, minimal dry cough.   Assessment/Plan: Principal Problem:   Acute on chronic combined systolic and diastolic CHF (congestive heart failure) (HCC) Active Problems:   Diabetes mellitus type 2 with retinopathy ([place])   Hyperlipidemia   Essential hypertension   Automatic implantable cardioverter-defibrillator in situ   Hypothyroidism   Anemia   Morbid obesity (HCC)   COPD, severe (HCC)   CKD (chronic kidney disease) stage 4, GFR 15-29 ml/min ([place])   Community acquired pneumonia   Acute hypoxemic respiratory failure (HCC)   Pressure injury of skin  Acute on chronic hypoxic respiratory failure most likely 2/2 to CAP, present on admission vs acute on chronic combined systolic and diastolic CHF-likely due to  medication noncompliance s/p right thoracentesis 04/02/17 -Lasix IV for diuresis x 1 dose IV 60 mg -resume torsemide due to worsening pulmonary edema -EF 25-30% 03/31/17 and 10/20/14 post biv AICD -BNP 1158 (03/29/17) from 829 (01/01/16) -On 2-3L O2 continuously at home and BIPAP at night -Continue O2 supplement to maintain O2 saturation 92% or greater -BIPAP at night -Right thoracentesis 04/02/17 with 900 cc yellow fluid removed  Heart failure with reduced EF 25-30% post Biv AICD -last echo 03/31/17 LVEF 25-30% -AICD replacement scheduled today 04/02/17 rescheduled; confirmed by Dr Tanna Furry office -cardiology consulted 04/01/17. We appreciate recommendations -asa, statin, beta blocker -Right thoracentesis 04/02/17 with 900 cc yellow fluid removed  Acute pulmonary edema and bilateral pleural effusions, present on admission -post thoracentesis 04/02/17 -900 cc yellow fluid removed -continue diurese -strict I&Os, daily weight, fluid and salt restriction  Hyponatremia  -most likely from chronic diuretic use -fluid restriction -continue diuresis to avoid worsening pulmonary edema -BMP am  Community-acquired pneumonia, present on admission -Cultures blood x2 no growth 4 days -IV Rocephin and IV azithromycin for empiric coverage completed 4 days - MRSA negative - lactic acid negative; procalcitonin - no active sign of infection, no aspiration- DC antibiotics  Hyperkalemia in the setting of advanced chronic kidney disease -resolved -received kayexalate 15 gm once 03/31/17 -bmp am  Asymptomatic pyuria, present on admission -U/A positive 03/29/17 -Denies symptoms -afebrile, no leukocytosis, lactic acid 1.2  COPD on home oxygen - Continue oxygen and wean as tolerated to baseline of 2-3 L nasal cannula - No active wheezing noted, DuoNeb's q 6 hours, hypersaline  nebs daily - IV solumedrol 40 mg BID x 4 doses starting 04/01/17 - BIPAP at night  Anuria in the setting of CKD  stage IV -creatinine worsening cr 3.65 from 3.60 from 3.33 from 3.03 3.62 from 3.84 -Baseline creatinine 3.8 -No urine output recorded -nephrology consultation  -closely monitor urine output -Follows with nephrology as outpatient  Normocytic anemia Most likely 2/2 to advanced chronic kidney disease -stable -no sign of obvious bleed -cbc am  Insulin dependent DMII -Continue current home insulin regimen -Carb modified diet -hypoglycemic protocol as needed  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 All questions answered.  Disposition Plan: Will stay another midnight to continue diurese for acute pulmonary edema and pleural effusions.   Consultants:  None  Procedures:  None  Antimicrobials:  IV azithromycin and IV ceftriaxone, day#3  DVT prophylaxis:  Therapeutic on warfarin   Objective: Vitals:   04/01/17 2341 04/02/17 0136 04/02/17 0342 04/02/17 0806  BP: (!) 112/56  112/65 (!) 149/71  Pulse: 69  70 72  Resp: 16  15 19   Temp: 98.4 F (36.9 C)  98.4 F (36.9 C) (!) 97.3 F (36.3 C)  TempSrc: Axillary  Axillary Oral  SpO2: 90% 92% 90% 96%  Weight:   109.1 kg (240 lb 8.4 oz)   Height:        Intake/Output Summary (Last 24 hours) at 04/02/2017 0844 Last data filed at 04/02/2017 0200 Gross per 24 hour  Intake 920 ml  Output -  Net 920 ml   Filed Weights   03/31/17 0110 04/01/17 0402 04/02/17 0342  Weight: 108 kg (238 lb 1.6 oz) 109.4 kg (241 lb 2.9 oz) 109.1 kg (240 lb 8.4 oz)    Exam:   General:  76 yo CF WD WN on nasal cannula 3L in  NAD  Cardiovascular: RRR with no rubs or gallop  Respiratory: Mild diffused crackles bilaterally. No wheezes noted  Abdomen: Obese soft NT ND NBS x4 quadrants.  Musculoskeletal: 2/4 pulses in all 4 extremities  Skin: pressure ulcer, present on admission  Psychiatry: Mood is appropriate for condition and setting.   Data Reviewed: CBC: Recent Labs  Lab 03/27/17 1559 03/29/17 0947 03/31/17 0338 04/01/17 0508 04/02/17 0610  WBC 4.8 6.6 6.4 8.4 2.7*  NEUTROABS 3.1  --   --   --   --   HGB 11.5 11.2* 10.5* 11.6* 10.4*  HCT 35.4 35.2* 33.0* 35.2* 32.6*  MCV 93 91.7 91.2 90.5 90.1  PLT 96* 94* 104* 107* 85*   Basic Metabolic Panel: Recent Labs  Lab 03/27/17 1224  03/29/17 0947 03/30/17 0411 03/31/17 0338 04/01/17 0508 04/02/17 0610  NA 134*   < > 132* 133* 131* 132* 130*  K 4.8   < > 5.6* 5.1 5.3* 4.9 5.1  CL 104   < > 102 101 99* 101 99*  CO2 24   < > 24 23 23  20* 22  GLUCOSE 141*   < > 169* 118* 137* 113* 205*  BUN 57*   < > 60* 61* 66* 69* 74*  CREATININE 3.18*   < > 3.10* 3.01* 3.33* 3.60* 3.65*  CALCIUM 8.6*   < > 8.5* 8.5* 8.4* 8.6*  8.3*  PHOS 2.8  --   --   --   --   --   --    < > = values in this interval not displayed.   GFR: Estimated Creatinine Clearance: 16.3 mL/min (A) (by C-G formula based on SCr of 3.65 mg/dL (H)). Liver Function Tests: Recent Labs  Lab 03/27/17 1224  ALBUMIN 2.8*   No results for input(s): LIPASE, AMYLASE in the last 168 hours. No results for input(s): AMMONIA in the last 168 hours. Coagulation Profile: Recent Labs  Lab 03/29/17 2124 03/30/17 0411 03/31/17 0338 04/01/17 1616 04/02/17 0610  INR 2.71 2.73 2.96 3.53 3.86   Cardiac Enzymes: No results for input(s): CKTOTAL, CKMB, CKMBINDEX, TROPONINI in the last 168 hours. BNP (last 3 results) No results for input(s): PROBNP in the last 8760 hours. HbA1C: No results for input(s): HGBA1C in the last 72 hours. CBG: Recent Labs  Lab 03/30/17 1708 03/30/17 2220  03/31/17 0743 03/31/17 1400 04/01/17 0855  GLUCAP 151* 151* 123* 185* 130*   Lipid Profile: No results for input(s): CHOL, HDL, LDLCALC, TRIG, CHOLHDL, LDLDIRECT in the last 72 hours. Thyroid Function Tests: No results for input(s): TSH, T4TOTAL, FREET4, T3FREE, THYROIDAB in the last 72 hours. Anemia Panel: No results for input(s): VITAMINB12, FOLATE, FERRITIN, TIBC, IRON, RETICCTPCT in the last 72 hours. Urine analysis:    Component Value Date/Time   COLORURINE YELLOW 03/29/2017 2348   APPEARANCEUR HAZY (A) 03/29/2017 2348   LABSPEC 1.013 03/29/2017 2348   PHURINE 5.0 03/29/2017 2348   GLUCOSEU NEGATIVE 03/29/2017 2348   GLUCOSEU NEGATIVE 06/25/2016 1002   HGBUR NEGATIVE 03/29/2017 2348   HGBUR large 05/17/2008 1325   BILIRUBINUR NEGATIVE 03/29/2017 2348   KETONESUR NEGATIVE 03/29/2017 2348   PROTEINUR NEGATIVE 03/29/2017 2348   UROBILINOGEN 0.2 06/25/2016 1002   NITRITE NEGATIVE 03/29/2017 2348   LEUKOCYTESUR LARGE (A) 03/29/2017 2348   Sepsis Labs: @LABRCNTIP (procalcitonin:4,lacticidven:4)  ) Recent Results (from the past 240 hour(s))  Culture, blood (routine x 2) Call MD if unable to obtain prior to antibiotics being given     Status: None (Preliminary result)   Collection Time: 03/29/17  3:45 PM  Result Value Ref Range Status   Specimen Description BLOOD LEFT ANTECUBITAL  Final   Special Requests   Final    BOTTLES DRAWN AEROBIC AND ANAEROBIC Blood Culture adequate volume   Culture NO GROWTH 3 DAYS  Final   Report Status PENDING  Incomplete  Culture, blood (routine x 2) Call MD if unable to obtain prior to antibiotics being given     Status: None (Preliminary result)   Collection Time: 03/29/17  4:10 PM  Result Value Ref Range Status   Specimen Description BLOOD RIGHT ARM  Final   Special Requests   Final    BOTTLES DRAWN AEROBIC AND ANAEROBIC Blood Culture adequate volume   Culture NO GROWTH 3 DAYS  Final   Report Status PENDING  Incomplete  MRSA PCR Screening      Status: None   Collection Time: 03/29/17  6:46 PM  Result Value Ref Range Status   MRSA by PCR NEGATIVE NEGATIVE Final    Comment:        The GeneXpert MRSA Assay (FDA approved for NASAL specimens only), is one component of a comprehensive MRSA colonization surveillance program. It is not intended to diagnose MRSA infection nor to guide or monitor treatment for MRSA infections.   C difficile quick scan w PCR reflex     Status: None  Collection Time: 03/30/17 11:39 PM  Result Value Ref Range Status   C Diff antigen NEGATIVE NEGATIVE Final   C Diff toxin NEGATIVE NEGATIVE Final   C Diff interpretation No C. difficile detected.  Final  Giardia/Cryptosporidium EIA     Status: None (Preliminary result)   Collection Time: 03/30/17 11:39 PM  Result Value Ref Range Status   Giardia Ag, Stl Negative Negative Final    Comment: (NOTE) Performed At: [facility] [place], [place] [id number] Rush Farmer MD [id number]    Cryptosporidium EIA PENDING  Incomplete   Source of Sample STOOL  Final      Studies: Dg Chest Port 1 View  Result Date: 04/01/2017 CLINICAL DATA:  Shortness of breath, history of COPD, non-Hodgkin's lymphoma, previous CVA, diabetes, nonsmoker. EXAM: PORTABLE CHEST 1 VIEW COMPARISON:  Chest x-ray of March 29, 2017 FINDINGS: The lungs are mildly hypoinflated. The interstitial markings are increased bilaterally. There is a small right pleural effusion. The pulmonary vascularity is engorged. The cardiac silhouette is enlarged. The patient has undergone previous CABG. The ICD is in stable position. There is calcification in the wall of the aortic arch. The observed bony thorax is unremarkable. IMPRESSION: CHF with mild to moderate interstitial edema. Small right pleural effusion. No definite pneumonia. Thoracic aortic atherosclerosis. Electronically Signed   By: David  Martinique M.D.   On: 04/01/2017 09:15    Scheduled Meds: . amiodarone  200  mg Oral Daily  . atorvastatin  10 mg Oral q1800  . azithromycin  500 mg Oral Daily  . carvedilol  12.5 mg Oral BID  . escitalopram  10 mg Oral Daily  . furosemide  60 mg Intravenous Once  . gabapentin  300 mg Oral QHS  . insulin NPH Human  15 Units Subcutaneous QAC breakfast  . ipratropium-albuterol  3 mL Nebulization Q6H  . levothyroxine  88 mcg Oral QAC breakfast  . methylPREDNISolone (SOLU-MEDROL) injection  40 mg Intravenous Q12H  . sodium chloride  3 mL Nebulization BID  . sodium chloride flush  3 mL Intravenous Q12H  . torsemide  20 mg Oral Once per day on Mon Tue Wed Thu  . warfarin  2.5 mg Oral q1800  . Warfarin - Physician Dosing Inpatient   Does not apply q1800    Continuous Infusions: . sodium chloride    . cefTRIAXone (ROCEPHIN)  IV Stopped (04/01/17 1803)     LOS: 4 days     Kayleen Memos, MD [facility] Pager [phone]  If 7PM-7AM, please contact night-coverage [email or web address] Password [facility] 04/02/2017, 8:44 AM

## 2017-04-02 NOTE — Care Management Important Message (Signed)
Important Message  Patient Details  Name: Destiny [name] MRN: [id number] Date of Birth: [birth year]   Medicare Important Message Given:  Yes    [name] Abena 04/02/2017, 9:05 AM

## 2017-04-02 NOTE — Procedures (Signed)
PROCEDURE SUMMARY:  Successful US guided right diagnostic and therapeutic thoracentesis. Yielded 900 mL of hazy, yellow fluid. Pt tolerated procedure well. No immediate complications.  Specimen was sent for labs. CXR ordered.  Docia Barrier PA-C 04/02/2017 2:46 PM

## 2017-04-02 NOTE — Progress Notes (Signed)
Pt had been placed on home bipap unit when RT got to pt room. Pt currently tolerating well at this time. RT will continue to monitor.

## 2017-04-02 NOTE — Progress Notes (Signed)
Progress Note  Patient Name: Destiny [name] Date of Encounter: 04/02/2017  Primary Cardiologist: Dr. Reine Just  Subjective   Still c/o shortness of breath. "I am hungry"  Inpatient Medications    Scheduled Meds: . amiodarone  200 mg Oral Daily  . atorvastatin  10 mg Oral q1800  . azithromycin  500 mg Oral Daily  . carvedilol  12.5 mg Oral BID  . escitalopram  10 mg Oral Daily  . gabapentin  300 mg Oral QHS  . insulin NPH Human  15 Units Subcutaneous QAC breakfast  . ipratropium-albuterol  3 mL Nebulization Q6H  . levothyroxine  88 mcg Oral QAC breakfast  . methylPREDNISolone (SOLU-MEDROL) injection  40 mg Intravenous Q12H  . sodium chloride  3 mL Nebulization BID  . sodium chloride flush  3 mL Intravenous Q12H  . torsemide  20 mg Oral Once per day on Mon Tue Wed Thu  . warfarin  2.5 mg Oral q1800  . Warfarin - Physician Dosing Inpatient   Does not apply q1800   Continuous Infusions: . sodium chloride    . cefTRIAXone (ROCEPHIN)  IV Stopped (04/01/17 1803)   PRN Meds: sodium chloride, acetaminophen, dicyclomine, diphenoxylate-atropine, fluticasone, ipratropium-albuterol, ondansetron (ZOFRAN) IV, oxymetazoline, sodium chloride, sodium chloride flush   Vital Signs    Vitals:   04/02/17 0136 04/02/17 0342 04/02/17 0806 04/02/17 0927  BP:  112/65 (!) 149/71 (!) 147/63  Pulse:  70 72 71  Resp:  15 19   Temp:  98.4 F (36.9 C) (!) 97.3 F (36.3 C)   TempSrc:  Axillary Oral   SpO2: 92% 90% 96%   Weight:  240 lb 8.4 oz (109.1 kg)    Height:        Intake/Output Summary (Last 24 hours) at 04/02/2017 0940 Last data filed at 04/02/2017 0200 Gross per 24 hour  Intake 680 ml  Output -  Net 680 ml   Filed Weights   03/31/17 0110 04/01/17 0402 04/02/17 0342  Weight: 238 lb 1.6 oz (108 kg) 241 lb 2.9 oz (109.4 kg) 240 lb 8.4 oz (109.1 kg)    Telemetry    AV paced - Personally Reviewed  ECG    AV paced - Personally Reviewed  Physical Exam   GEN: No acute  distress.   Neck: 7 cm JVD Cardiac: RRR, no murmurs, rubs, or gallops.  Respiratory: scattered rales in the bases bilaterally. GI: Soft, nontender, non-distended  MS: No edema; No deformity. Neuro:  Nonfocal  Psych: Normal affect   Labs    Chemistry Recent Labs  Lab 03/27/17 1224  03/31/17 0338 04/01/17 0508 04/02/17 0610  NA 134*   < > 131* 132* 130*  K 4.8   < > 5.3* 4.9 5.1  CL 104   < > 99* 101 99*  CO2 24   < > 23 20* 22  GLUCOSE 141*   < > 137* 113* 205*  BUN 57*   < > 66* 69* 74*  CREATININE 3.18*   < > 3.33* 3.60* 3.65*  CALCIUM 8.6*   < > 8.4* 8.6* 8.3*  ALBUMIN 2.8*  --   --   --   --   GFRNONAA 13*   < > 13* 12* 11*  GFRAA 15*   < > 15* 13* 13*  ANIONGAP 6   < > 9 11 9    < > = values in this interval not displayed.     Hematology Recent Labs  Lab 03/31/17 [phone] 04/01/17 6010 04/02/17  0610  WBC 6.4 8.4 2.7*  RBC 3.62* 3.89 3.62*  HGB 10.5* 11.6* 10.4*  HCT 33.0* 35.2* 32.6*  MCV 91.2 90.5 90.1  MCH 29.0 29.8 28.7  MCHC 31.8 33.0 31.9  RDW 14.9 15.3 14.8  PLT 104* 107* 85*    Cardiac EnzymesNo results for input(s): TROPONINI in the last 168 hours. No results for input(s): TROPIPOC in the last 168 hours.   BNP Recent Labs  Lab 03/29/17 0947  BNP 1,158.1*     DDimer No results for input(s): DDIMER in the last 168 hours.   Radiology    Dg Chest Port 1 View  Result Date: 04/01/2017 CLINICAL DATA:  Shortness of breath, history of COPD, non-Hodgkin's lymphoma, previous CVA, diabetes, nonsmoker. EXAM: PORTABLE CHEST 1 VIEW COMPARISON:  Chest x-ray of March 29, 2017 FINDINGS: The lungs are mildly hypoinflated. The interstitial markings are increased bilaterally. There is a small right pleural effusion. The pulmonary vascularity is engorged. The cardiac silhouette is enlarged. The patient has undergone previous CABG. The ICD is in stable position. There is calcification in the wall of the aortic arch. The observed bony thorax is unremarkable.  IMPRESSION: CHF with mild to moderate interstitial edema. Small right pleural effusion. No definite pneumonia. Thoracic aortic atherosclerosis. Electronically Signed   By: David  Martinique M.D.   On: 04/01/2017 09:15    Cardiac Studies   none  Patient Profile     76 y.o. female admitted with COPD/CHF/possible pneumonia  Assessment & Plan    1. ICD at [facility] - she was scheduled to undergo down grade from a BiV ICD to a BiV PPM. We will hold off on this until she has recovered and discharged home. 2. Dyspnea - suspect that this is multifactorial. Continue current treatment. I am concerned about volume overload as well but her creatinine is elevated  3. Acute on chronic renal failure, stage 5 - her creatinine is stable. She will likely need to discuss HD at some point.   For questions or updates, please contact [place] Please consult [email or web address] for contact info under Cardiology/STEMI.      Signed, Cristopher Peru, MD  04/02/2017, 9:40 AM  Patient ID: Daneil Dolin, female   DOB: March 01, 1941, 76 y.o.   MRN: [id number]

## 2017-04-02 NOTE — Progress Notes (Signed)
ANTICOAGULATION CONSULT NOTE - Follow Up Consult  Pharmacy Consult for Warfarin Indication: atrial fibrillation and mural thrombus  Allergies  Allergen Reactions  . Sulfonamide Derivatives Other (See Comments)    Reaction unknown; "childhood allergy/mother"    Patient Measurements: Height: 5\' 4"  (162.6 cm) Weight: 240 lb 8.4 oz (109.1 kg) IBW/kg (Calculated) : 54.7  Vital Signs: Temp: 97.3 F (36.3 C) (11/20 0806) Temp Source: Oral (11/20 0806) BP: 147/63 (11/20 0927) Pulse Rate: 71 (11/20 0927)  Labs: Recent Labs    03/31/17 0338 04/01/17 0508 04/01/17 1616 04/02/17 0610  HGB 10.5* 11.6*  --  10.4*  HCT 33.0* 35.2*  --  32.6*  PLT 104* 107*  --  85*  LABPROT 30.6*  --  35.1* 37.6*  INR 2.96  --  3.53 3.86  CREATININE 3.33* 3.60*  --  3.65*    Estimated Creatinine Clearance: 16.3 mL/min (A) (by C-G formula based on SCr of 3.65 mg/dL (H)).   Medications:  Coumadin 2.5mg  daily given 11/17 - 11/18  Assessment: 76 year old female admitted with shortness of breath. She is on chronic anticoagulation with Coumadin for atrial fibrillation and history of a mural thrombus. Her INR has been therapeutic during admission but is now supratherapeutic.  Coumadin was held 11/19. Her hemoglobin and platelet count are decreased today. No bleeding complications reported.  Goal of Therapy:  INR 2-3 Monitor platelets by anticoagulation protocol: Yes   Plan:  No Coumadin today Daily PT/INR monitoring Monitor for bleeding complications  Norva Riffle 04/02/2017,10:38 AM

## 2017-04-02 NOTE — Care Management Note (Signed)
Case Management Note  Patient Details  Name: Destiny [name] MRN: [id number] Date of Birth: 1940-08-08  Subjective/Objective:   From home, presents with COPD/CHF/possible pna, acute on chronic renal failure stage 5, will need to discuss HD at some point per MD note.                       Action/Plan: NCM will follow for dc needs.   Expected Discharge Date:  04/02/17               Expected Discharge Plan:     In-House Referral:     Discharge planning Services  CM Consult  Post Acute Care Choice:    Choice offered to:     DME Arranged:    DME Agency:     HH Arranged:    HH Agency:     Status of Service:  In process, will continue to follow  If discussed at Long Length of Stay Meetings, dates discussed:    Additional Comments:  [name], RN 04/02/2017, 4:56 PM

## 2017-04-03 LAB — BASIC METABOLIC PANEL
ANION GAP: 9 (ref 5–15)
BUN: 85 mg/dL — ABNORMAL HIGH (ref 6–20)
CHLORIDE: 97 mmol/L — AB (ref 101–111)
CO2: 22 mmol/L (ref 22–32)
Calcium: 8.3 mg/dL — ABNORMAL LOW (ref 8.9–10.3)
Creatinine, Ser: 3.64 mg/dL — ABNORMAL HIGH (ref 0.44–1.00)
GFR calc Af Amer: 13 mL/min — ABNORMAL LOW (ref >60)
GFR calc non Af Amer: 11 mL/min — ABNORMAL LOW (ref >60)
GLUCOSE: 328 mg/dL — AB (ref 65–99)
POTASSIUM: 5.1 mmol/L (ref 3.5–5.1)
Sodium: 128 mmol/L — ABNORMAL LOW (ref 135–145)

## 2017-04-03 LAB — CULTURE, BLOOD (ROUTINE X 2)
Culture: NO GROWTH
Culture: NO GROWTH
Special Requests: ADEQUATE
Special Requests: ADEQUATE

## 2017-04-03 LAB — CBC
HEMATOCRIT: 31.5 % — AB (ref 36.0–46.0)
Hemoglobin: 10.2 g/dL — ABNORMAL LOW (ref 12.0–15.0)
MCH: 28.8 pg (ref 26.0–34.0)
MCHC: 32.4 g/dL (ref 30.0–36.0)
MCV: 89 fL (ref 78.0–100.0)
Platelets: 94 10*3/uL — ABNORMAL LOW (ref 150–400)
RBC: 3.54 MIL/uL — AB (ref 3.87–5.11)
RDW: 14.9 % (ref 11.5–15.5)
WBC: 3.2 10*3/uL — AB (ref 4.0–10.5)

## 2017-04-03 LAB — GLUCOSE, CAPILLARY
Glucose-Capillary: 334 mg/dL — ABNORMAL HIGH (ref 65–99)
Glucose-Capillary: 387 mg/dL — ABNORMAL HIGH (ref 65–99)

## 2017-04-03 LAB — PROTIME-INR
INR: 3.8
Prothrombin Time: 37.2 seconds — ABNORMAL HIGH (ref 11.4–15.2)

## 2017-04-03 LAB — PH, BODY FLUID: pH, Body Fluid: 7.9

## 2017-04-03 MED ORDER — FUROSEMIDE 10 MG/ML IJ SOLN
120.0000 mg | Freq: Once | INTRAVENOUS | Status: AC
  Administered 2017-04-03: 120 mg via INTRAVENOUS
  Filled 2017-04-03: qty 12

## 2017-04-03 MED ORDER — FUROSEMIDE 10 MG/ML IJ SOLN
120.0000 mg | Freq: Two times a day (BID) | INTRAVENOUS | Status: AC
  Administered 2017-04-03 – 2017-04-04 (×2): 120 mg via INTRAVENOUS
  Filled 2017-04-03: qty 12
  Filled 2017-04-03: qty 10

## 2017-04-03 NOTE — Progress Notes (Signed)
Inpatient Diabetes Program Recommendations  AACE/ADA: New Consensus Statement on Inpatient Glycemic Control (2015)  Target Ranges:  Prepandial:   less than 140 mg/dL      Peak postprandial:   less than 180 mg/dL (1-2 hours)      Critically ill patients:  140 - 180 mg/dL    Results for Destiny [name], Destiny [name] (MRN [id number]) as of 04/03/2017 11:07  Ref. Range 04/02/2017 09:16 04/02/2017 20:58 04/03/2017 07:52  Glucose-Capillary Latest Ref Range: 65 - 99 mg/dL 216 (H) 315 (H) 334 (H)   Admit with: Dyspnea  History: DM, CKD, CHF, COPD  Home DM Meds: NPH Insulin- 15 units daily  Current Insulin Orders: NPH Insulin- 15 units daily       MD- Note patient was getting Solumedrol.  Last dose given this morning at 3am.  No more steroids ordered at present.  Please consider starting Novolog Moderate Correction Scale/ SSI (0-15 units) TID AC + HS       --Will follow patient during hospitalization--  Wyn Quaker RN, MSN, CDE Diabetes Coordinator Inpatient Glycemic Control Team Team Pager: [phone] (8a-5p)

## 2017-04-03 NOTE — Progress Notes (Signed)
ANTICOAGULATION CONSULT NOTE - Follow Up Consult  Pharmacy Consult for Warfarin Indication: atrial fibrillation and mural thrombus  Allergies  Allergen Reactions  . Sulfonamide Derivatives Other (See Comments)    Reaction unknown; "childhood allergy/mother"    Patient Measurements: Height: 5\' 4"  (162.6 cm) Weight: 240 lb 8.4 oz (109.1 kg) IBW/kg (Calculated) : 54.7  Vital Signs: Temp: 97.3 F (36.3 C) (11/21 0754) Temp Source: Oral (11/21 0754) BP: 127/58 (11/21 0938) Pulse Rate: 70 (11/21 0938)  Labs: Recent Labs    04/01/17 0508 04/01/17 1616 04/02/17 0610 04/03/17 0414  HGB 11.6*  --  10.4* 10.2*  HCT 35.2*  --  32.6* 31.5*  PLT 107*  --  85* 94*  LABPROT  --  35.1* 37.6* 37.2*  INR  --  3.53 3.86 3.80  CREATININE 3.60*  --  3.65* 3.64*    Estimated Creatinine Clearance: 16.4 mL/min (A) (by C-G formula based on SCr of 3.64 mg/dL (H)).   Medications:  Coumadin 2.5mg  daily given 11/17 - 11/18  Assessment: 76 year old female admitted with shortness of breath. She is on chronic anticoagulation with Coumadin for atrial fibrillation and history of a mural thrombus. Her INR remains supratherapeutic today, likely due to low flow from CHF and acute illness.  Her last Coumadin dose was given on 11/18. Her hemoglobin and platelet count are decreased today. No bleeding complications reported.  Goal of Therapy:  INR 2-3 Monitor platelets by anticoagulation protocol: Yes   Plan:  No Coumadin today Daily PT/INR monitoring Monitor for bleeding complications  Norva Riffle 04/03/2017,10:35 AM

## 2017-04-03 NOTE — Progress Notes (Signed)
Progress Note  Patient Name: Destiny [name] Date of Encounter: 04/03/2017  Primary Cardiologist: Dr. Haroldine Laws  Subjective   Less SOB today, easier to talk, no CP  Inpatient Medications    Scheduled Meds: . amiodarone  200 mg Oral Daily  . atorvastatin  10 mg Oral q1800  . carvedilol  12.5 mg Oral BID  . escitalopram  10 mg Oral Daily  . gabapentin  300 mg Oral QHS  . insulin NPH Human  15 Units Subcutaneous QAC breakfast  . ipratropium-albuterol  3 mL Nebulization Q6H  . levothyroxine  88 mcg Oral QAC breakfast  . sodium chloride  3 mL Nebulization BID  . sodium chloride flush  3 mL Intravenous Q12H  . Warfarin - Pharmacist Dosing Inpatient   Does not apply q1800   Continuous Infusions: . sodium chloride    . furosemide     PRN Meds: sodium chloride, acetaminophen, dicyclomine, diphenoxylate-atropine, fluticasone, ipratropium-albuterol, lidocaine, ondansetron (ZOFRAN) IV, oxymetazoline, sodium chloride, sodium chloride flush   Vital Signs    Vitals:   04/03/17 0317 04/03/17 0337 04/03/17 0744 04/03/17 0754  BP: (!) 119/56  123/63 123/63  Pulse: 72 70 71 73  Resp: 18 20 14 19   Temp: (!) 97.4 F (36.3 C)  (!) 94.3 F (34.6 C) (!) 97.3 F (36.3 C)  TempSrc: Axillary  Oral Oral  SpO2: 94% 96% 97% 99%  Weight:  240 lb 8.4 oz (109.1 kg)    Height:        Intake/Output Summary (Last 24 hours) at 04/03/2017 0821 Last data filed at 04/03/2017 0125 Gross per 24 hour  Intake 212 ml  Output -  Net 212 ml   Filed Weights   04/01/17 0402 04/02/17 0342 04/03/17 0337  Weight: 241 lb 2.9 oz (109.4 kg) 240 lb 8.4 oz (109.1 kg) 240 lb 8.4 oz (109.1 kg)    Telemetry    AV paced - Personally Reviewed  ECG    No new EKGs - Personally Reviewed  Physical Exam   GEN: No acute distress.   Neck: + JVD Cardiac: RRR,soft SMs, no rubs, or gallops.  Respiratory: diminished throughout, soft crackles right base. GI: Soft, nontender, non-distended, onese MS: 1+ edema;  No deformity. Neuro:  Nonfocal  Psych: Normal affect   Labs    Chemistry Recent Labs  Lab 03/27/17 1224  04/01/17 0508 04/02/17 0610 04/03/17 0414  NA 134*   < > 132* 130* 128*  K 4.8   < > 4.9 5.1 5.1  CL 104   < > 101 99* 97*  CO2 24   < > 20* 22 22  GLUCOSE 141*   < > 113* 205* 328*  BUN 57*   < > 69* 74* 85*  CREATININE 3.18*   < > 3.60* 3.65* 3.64*  CALCIUM 8.6*   < > 8.6* 8.3* 8.3*  ALBUMIN 2.8*  --   --   --   --   GFRNONAA 13*   < > 12* 11* 11*  GFRAA 15*   < > 13* 13* 13*  ANIONGAP 6   < > 11 9 9    < > = values in this interval not displayed.     Hematology Recent Labs  Lab 04/01/17 0508 04/02/17 0610 04/03/17 0414  WBC 8.4 2.7* 3.2*  RBC 3.89 3.62* 3.54*  HGB 11.6* 10.4* 10.2*  HCT 35.2* 32.6* 31.5*  MCV 90.5 90.1 89.0  MCH 29.8 28.7 28.8  MCHC 33.0 31.9 32.4  RDW 15.3 14.8  14.9  PLT 107* 85* 94*    Cardiac EnzymesNo results for input(s): TROPONINI in the last 168 hours. No results for input(s): TROPIPOC in the last 168 hours.   BNP Recent Labs  Lab 03/29/17 0947  BNP 1,158.1*     DDimer No results for input(s): DDIMER in the last 168 hours.   Radiology    Dg Chest 1 View Result Date: 04/02/2017 CLINICAL DATA:  Shortness of breath, post thoracentesis EXAM: CHEST 1 VIEW COMPARISON:  04/01/2017 FINDINGS: Small right pleural effusion.  No pneumothorax is seen. Left lung base is obscured. Cardiomegaly.  Mild interstitial edema. Left subclavian ICD. Median sternotomy. IMPRESSION: No pneumothorax is seen status post thoracentesis. Cardiomegaly with mild interstitial edema and small right pleural effusion. Electronically Signed   By: Julian Hy M.D.   On: 04/02/2017 13:03   Ir Thoracentesis Asp Pleural Space W/img Guide Result Date: 04/02/2017 INDICATION: Patient with right pleural effusion. Request is made for diagnostic and therapeutic right thoracentesis. EXAM: ULTRASOUND GUIDED DIAGNOSTIC AND THERAPEUTIC RIGHT THORACENTESIS MEDICATIONS: 10  mL 2% lidocaine COMPLICATIONS: None immediate. PROCEDURE: An ultrasound guided thoracentesis was thoroughly discussed with the patient and questions answered. The benefits, risks, alternatives and complications were also discussed. The patient understands and wishes to proceed with the procedure. Written consent was obtained. Ultrasound was performed to localize and mark an adequate pocket of fluid in the right chest. The area was then prepped and draped in the normal sterile fashion. 2% Lidocaine was used for local anesthesia. Under ultrasound guidance a Safe-T-Centesis catheter was introduced. Thoracentesis was performed. The catheter was removed and a dressing applied. FINDINGS: A total of approximately 900 mL of hazy, yellow fluid was removed. Samples were sent to the laboratory as requested by the clinical team. IMPRESSION: Successful ultrasound guided diagnostic and therapeutic right thoracentesis yielding 900 mL of pleural fluid. Read by:  Brynda Greathouse PA-C Electronically Signed   By: Aletta Edouard M.D.   On: 04/02/2017 16:13    Cardiac Studies   03/31/17: TTE Study Conclusions - Left ventricle: The cavity size was normal. There was moderate   concentric hypertrophy. Systolic function was severely reduced.   The estimated ejection fraction was in the range of 25% to 30%.   Diffuse hypokinesis. - Aortic valve: Mean gradient (S): 18 mm Hg. Peak gradient (S): 34   mm Hg. Valve area (VTI): 0.74 cm^2. Valve area (Vmax): 0.72 cm^2.   Valve area (Vmean): 0.76 cm^2. - Mitral valve: Calcified annulus. Moderately thickened, severely   calcified leaflets . The findings are consistent with mild   stenosis. There was mild regurgitation. Mean gradient (D): 4 mm   Hg. Valve area by continuity equation (using LVOT flow): 0.73   cm^2. - Left atrium: The atrium was moderately dilated. - Right ventricle: The cavity size was severely dilated. Wall   thickness was normal. Systolic function was moderately  reduced. - Right atrium: The atrium was mildly dilated. - Tricuspid valve: There was moderate regurgitation. - Pulmonary arteries: Systolic pressure was moderately increased.   PA peak pressure: 61 mm Hg (S). - Inferior vena cava: The vessel was dilated. The respirophasic   diameter changes were blunted (< 50%), consistent with elevated   central venous pressure Impressions: - When compared to the prior study from 02/11/2016 LVEF has   decreased from 40% to 25-30%. Transaortic gradients are in the   moderate aortic stenosis range, however AVA consistent with low   flow severe aortic stenosis.   Right ventricle is severely  dilated and systolic function at   least moderately decreased. Moderate pulmonary hypertension.  Patient Profile     76 y.o. 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 ICD at elective replacement interval (02/21/17) Planned for device change to CRT-P, this can be delayed, patient will be contacted by Dr. Tanna Furry RN post discharge to re-schedule  2. Respiratory failure 3. Pneumonia 4. Fluid OL (chronic CHF systolic) 5. COPD (on home O2) --s/p right thora yesterday  900 mL of hazy, yellow fluid (lab pending) -- no out put charted yesterday though reported + urine, much unable to be recorded -- weight up --nephrology on board    Will defer diuresis to their service  6. Hx of LV thrombus on warfarin     INR 3.80, pharmacy on board     No warfarin 2 days     H/H stable    EP service will follow from [place], please recall if needed     For questions or updates, please contact [place] Please consult [email or web address] for contact info under Cardiology/STEMI.      Signed, Baldwin Jamaica, PA-C  04/03/2017, 8:21 AM    EP  Attending  Patient seen and examined. Agree with above. The patient is doing well after lasix and thoracentesis. I have recommended she continue her diuretic, anti-biotics transitioning to oral and follow renal function which is currently stable. Her prognosis is guarded and dependent on her renal function and lungs.  Mikle Bosworth.D.

## 2017-04-03 NOTE — Progress Notes (Signed)
PROGRESS NOTE    Destiny [name]  LPF:[id number] DOB: 04-04-1941 DOA: 03/29/2017 PCP: Mosie Lukes, MD      Brief Narrative:  76 yo F CHF EF 20% with AICD, COPD on home oxygen of 2 L, CKD stage IV, anticoagulation with Coumadin with prior LV mural thrombus, and DM who presents with few days dyspnea.   Treated with 4 days ceftriaxone and azithromycin, no improvement.  Attempted diuresis but very little urine output, poorly quantified.       Assessment & Plan:  Principal Problem:   Acute on chronic combined systolic and diastolic CHF (congestive heart failure) (HCC) Active Problems:   Diabetes mellitus type 2 with retinopathy ([place])   Hyperlipidemia   Essential hypertension   Automatic implantable cardioverter-defibrillator in situ   Hypothyroidism   Anemia   Morbid obesity (HCC)   COPD, severe (HCC)   CKD (chronic kidney disease) stage 4, GFR 15-29 ml/min ([place])   Community acquired pneumonia   Acute hypoxemic respiratory failure (HCC)   Pressure injury of skin   Acute on chronic hypoxic respiratory failure from congestive heart failure, pleural effusion Improved after thoracentesis yesterday -IV Lasix per nephrology - Hold other diuretics -Strict I/O -Daily BMP -NIMV at night  Acute on chronic systolic CHF -IV Lasix -Monitor respiratory status and UOP -Continue aspirin, statin, BB  Hyponatremia Hypervolemic, worsening -Daily BMP  Suspected Community Acquired pneumonia Completed course of antibiotics.  Pleural fluid with elevated LDH, low protein.  Meets Light's criteria, but suspect this is not parapneumonic.  Chronic COPD -Continue Solumedrol -Continue bronchodilators  Diabetes -Continue NPH -Continue SSI  Atrial fibrillation -Continue amiodarone, Coreg, warfarin  Hypothyroidism -Continue levothyroxine  Other medications -Continue Bentyl -Continue Lexapro -Continue gabapentin     DVT prophylaxis: N/A Code Status: FULL Family  Communication: Husband Disposition Plan: Carefully monitor UOP and continue diuresis.   Consultants:   Nephrology, EP  Procedures:   Thoracentesis  Antimicrobials:   Ceftraixone and azithromycin, completed 4 days    Subjective: Feeling better after thora.  No new chest pain, fever, confusion, sputum production.    Objective: Vitals:   04/03/17 0843 04/03/17 0938 04/03/17 1143 04/03/17 1417  BP:  (!) 127/58 (!) 127/59   Pulse:  70 70   Resp:   19   Temp:   (!) 97.5 F (36.4 C)   TempSrc:   Oral   SpO2: 99%  98% 96%  Weight:      Height:        Intake/Output Summary (Last 24 hours) at 04/03/2017 1649 Last data filed at 04/03/2017 1145 Gross per 24 hour  Intake 572 ml  Output 150 ml  Net 422 ml   Filed Weights   04/01/17 0402 04/02/17 0342 04/03/17 0337  Weight: 109.4 kg (241 lb 2.9 oz) 109.1 kg (240 lb 8.4 oz) 109.1 kg (240 lb 8.4 oz)    Examination: General appearance: Elderly obese adult female, alert and in no acute distress.   HEENT: Anicteric, conjunctiva pink, lids and lashes normal. No nasal deformity, discharge, epistaxis.  Lips moist.   Skin: Warm and dry.  No jaundice.  No suspicious rashes or lesions. Cardiac: RRR, nl S1-S2, no murmurs appreciated.  Capillary refill is brisk.  JVP not visible.  Mild LE edema.  Radia pulses 2+ and symmetric. Respiratory: Normal respiratory rate and rhythm.  CTAB without rales or wheezes. Abdomen: Abdomen soft.  No TTP. No ascites, distension, hepatosplenomegaly.   MSK: No deformities or effusions. Neuro: Awake and alert.  EOMI,  moves all extremities. Speech fluent.    Psych: Sensorium intact and responding to questions, attention normal. Affect normal.  Judgment and insight appear normal.    Data Reviewed: I have personally reviewed following labs and imaging studies:  CBC: Recent Labs  Lab 03/29/17 0947 03/31/17 0338 04/01/17 0508 04/02/17 0610 04/03/17 0414  WBC 6.6 6.4 8.4 2.7* 3.2*  HGB 11.2* 10.5*  11.6* 10.4* 10.2*  HCT 35.2* 33.0* 35.2* 32.6* 31.5*  MCV 91.7 91.2 90.5 90.1 89.0  PLT 94* 104* 107* 85* 94*   Basic Metabolic Panel: Recent Labs  Lab 03/30/17 0411 03/31/17 0338 04/01/17 0508 04/02/17 0610 04/03/17 0414  NA 133* 131* 132* 130* 128*  K 5.1 5.3* 4.9 5.1 5.1  CL 101 99* 101 99* 97*  CO2 23 23 20* 22 22  GLUCOSE 118* 137* 113* 205* 328*  BUN 61* 66* 69* 74* 85*  CREATININE 3.01* 3.33* 3.60* 3.65* 3.64*  CALCIUM 8.5* 8.4* 8.6* 8.3* 8.3*   GFR: Estimated Creatinine Clearance: 16.4 mL/min (A) (by C-G formula based on SCr of 3.64 mg/dL (H)). Liver Function Tests: No results for input(s): AST, ALT, ALKPHOS, BILITOT, PROT, ALBUMIN in the last 168 hours. No results for input(s): LIPASE, AMYLASE in the last 168 hours. No results for input(s): AMMONIA in the last 168 hours. Coagulation Profile: Recent Labs  Lab 03/30/17 0411 03/31/17 0338 04/01/17 1616 04/02/17 0610 04/03/17 0414  INR 2.73 2.96 3.53 3.86 3.80   Cardiac Enzymes: No results for input(s): CKTOTAL, CKMB, CKMBINDEX, TROPONINI in the last 168 hours. BNP (last 3 results) No results for input(s): PROBNP in the last 8760 hours. HbA1C: No results for input(s): HGBA1C in the last 72 hours. CBG: Recent Labs  Lab 04/01/17 0855 04/02/17 0916 04/02/17 2058 04/03/17 0752 04/03/17 1141  GLUCAP 130* 216* 315* 334* 387*   Lipid Profile: No results for input(s): CHOL, HDL, LDLCALC, TRIG, CHOLHDL, LDLDIRECT in the last 72 hours. Thyroid Function Tests: No results for input(s): TSH, T4TOTAL, FREET4, T3FREE, THYROIDAB in the last 72 hours. Anemia Panel: No results for input(s): VITAMINB12, FOLATE, FERRITIN, TIBC, IRON, RETICCTPCT in the last 72 hours. Urine analysis:    Component Value Date/Time   COLORURINE YELLOW 03/29/2017 2348   APPEARANCEUR HAZY (A) 03/29/2017 2348   LABSPEC 1.013 03/29/2017 2348   PHURINE 5.0 03/29/2017 2348   GLUCOSEU NEGATIVE 03/29/2017 2348   GLUCOSEU NEGATIVE 06/25/2016  1002   HGBUR NEGATIVE 03/29/2017 2348   HGBUR large 05/17/2008 1325   BILIRUBINUR NEGATIVE 03/29/2017 [place] 03/29/2017 2348   PROTEINUR NEGATIVE 03/29/2017 2348   UROBILINOGEN 0.2 06/25/2016 1002   NITRITE NEGATIVE 03/29/2017 2348   LEUKOCYTESUR LARGE (A) 03/29/2017 2348   Sepsis Labs: @LABRCNTIP (procalcitonin:4,lacticidven:4)  ) Recent Results (from the past 240 hour(s))  Culture, blood (routine x 2) Call MD if unable to obtain prior to antibiotics being given     Status: None   Collection Time: 03/29/17  3:45 PM  Result Value Ref Range Status   Specimen Description BLOOD LEFT ANTECUBITAL  Final   Special Requests   Final    BOTTLES DRAWN AEROBIC AND ANAEROBIC Blood Culture adequate volume   Culture NO GROWTH 5 DAYS  Final   Report Status 04/03/2017 FINAL  Final  Culture, blood (routine x 2) Call MD if unable to obtain prior to antibiotics being given     Status: None   Collection Time: 03/29/17  4:10 PM  Result Value Ref Range Status   Specimen Description BLOOD RIGHT ARM  Final   Special Requests   Final    BOTTLES DRAWN AEROBIC AND ANAEROBIC Blood Culture adequate volume   Culture NO GROWTH 5 DAYS  Final   Report Status 04/03/2017 FINAL  Final  MRSA PCR Screening     Status: None   Collection Time: 03/29/17  6:46 PM  Result Value Ref Range Status   MRSA by PCR NEGATIVE NEGATIVE Final    Comment:        The GeneXpert MRSA Assay (FDA approved for NASAL specimens only), is one component of a comprehensive MRSA colonization surveillance program. It is not intended to diagnose MRSA infection nor to guide or monitor treatment for MRSA infections.   C difficile quick scan w PCR reflex     Status: None   Collection Time: 03/30/17 11:39 PM  Result Value Ref Range Status   C Diff antigen NEGATIVE NEGATIVE Final   C Diff toxin NEGATIVE NEGATIVE Final   C Diff interpretation No C. difficile detected.  Final  Giardia/Cryptosporidium EIA     Status: None     Collection Time: 03/30/17 11:39 PM  Result Value Ref Range Status   Giardia Ag, Stl Negative Negative Final   Cryptosporidium EIA Negative Negative Final    Comment: (NOTE) Performed At: [facility] [place], [place] [id number] Rush Farmer MD QQ:[id number]    Source of Sample STOOL  Final  Culture, body fluid-bottle     Status: None (Preliminary result)   Collection Time: 04/02/17 12:59 PM  Result Value Ref Range Status   Specimen Description FLUID RIGHT PLEURAL  Final   Special Requests NONE  Final   Culture NO GROWTH 1 DAY  Final   Report Status PENDING  Incomplete  Gram stain     Status: None   Collection Time: 04/02/17 12:59 PM  Result Value Ref Range Status   Specimen Description FLUID RIGHT PLEURAL  Final   Special Requests NONE  Final   Gram Stain NO WBC SEEN NO ORGANISMS SEEN   Final   Report Status 04/02/2017 FINAL  Final         Radiology Studies: Dg Chest 1 View  Result Date: 04/02/2017 CLINICAL DATA:  Shortness of breath, post thoracentesis EXAM: CHEST 1 VIEW COMPARISON:  04/01/2017 FINDINGS: Small right pleural effusion.  No pneumothorax is seen. Left lung base is obscured. Cardiomegaly.  Mild interstitial edema. Left subclavian ICD. Median sternotomy. IMPRESSION: No pneumothorax is seen status post thoracentesis. Cardiomegaly with mild interstitial edema and small right pleural effusion. Electronically Signed   By: Julian Hy M.D.   On: 04/02/2017 13:03   Ir Thoracentesis Asp Pleural Space W/img Guide  Result Date: 04/02/2017 INDICATION: Patient with right pleural effusion. Request is made for diagnostic and therapeutic right thoracentesis. EXAM: ULTRASOUND GUIDED DIAGNOSTIC AND THERAPEUTIC RIGHT THORACENTESIS MEDICATIONS: 10 mL 2% lidocaine COMPLICATIONS: None immediate. PROCEDURE: An ultrasound guided thoracentesis was thoroughly discussed with the patient and questions answered. The benefits, risks, alternatives and  complications were also discussed. The patient understands and wishes to proceed with the procedure. Written consent was obtained. Ultrasound was performed to localize and mark an adequate pocket of fluid in the right chest. The area was then prepped and draped in the normal sterile fashion. 2% Lidocaine was used for local anesthesia. Under ultrasound guidance a Safe-T-Centesis catheter was introduced. Thoracentesis was performed. The catheter was removed and a dressing applied. FINDINGS: A total of approximately 900 mL of hazy, yellow fluid was removed. Samples were sent to the  laboratory as requested by the clinical team. IMPRESSION: Successful ultrasound guided diagnostic and therapeutic right thoracentesis yielding 900 mL of pleural fluid. Read by:  Brynda Greathouse PA-C Electronically Signed   By: Aletta Edouard M.D.   On: 04/02/2017 16:13        Scheduled Meds: . amiodarone  200 mg Oral Daily  . atorvastatin  10 mg Oral q1800  . carvedilol  12.5 mg Oral BID  . escitalopram  10 mg Oral Daily  . gabapentin  300 mg Oral QHS  . insulin NPH Human  15 Units Subcutaneous QAC breakfast  . ipratropium-albuterol  3 mL Nebulization Q6H  . levothyroxine  88 mcg Oral QAC breakfast  . sodium chloride flush  3 mL Intravenous Q12H  . Warfarin - Pharmacist Dosing Inpatient   Does not apply q1800   Continuous Infusions: . sodium chloride    . furosemide Stopped (04/03/17 1438)     LOS: 5 days    Time spent: 25 minutes    Edwin Dada, MD [facility] Pager [phone]  If 7PM-7AM, please contact night-coverage [email or web address] Password [facility] 04/03/2017, 4:49 PM

## 2017-04-03 NOTE — Consult Note (Signed)
[facility] Consultation Note Requesting Physician:  Dr. Nevada Crane Primary Nephrologist: Dr. Marval Regal Reason for Consult: "anuria"   HPI: The patient is a 76 y.o. year-old w/complicated PMH DM, MO, h/o non-Hodgkins's lymphoma, chronic systolic/diastolic HF with Right HF, PAF (chronic anticoagulation), OHS/OSA on NIMV, pacer/ICD, CAD prior CABG, HLD, anemia on Aranesp. Pt with known advanced CKD Stage 5 (GFR baseline 12-13 at last [facility] labs 02/19/18) followed by Dr. Marval Regal. Multiple prior episodes AKI on CKD usually cardiorenal in nature. Admitted 03/30/17 w/ progressive SOB, bilateral pleural effusions on CXR + R PNA. Had R thoracentesis of 900 ml on 11/19 with some relief in breathing and improvement in O2sats.    Creatinine on admission 3 (better than most recent baseline Oct 2018) ) and has crept up to 3.6 or so since 11/19. Pt is reported to be "anuric" though I/O records have been somewhat in doubt. We are called to see. Initially started on home torsemide of 20 (and has been getting on 4 days a week), then past 3-4 days 3 doses of lasix 40, 40, 60, 120 last night. Has developed mild hyponatremia (128) and K around 5.1. Weight up 3 kg since admission. We were asked to see.  Per pt she is NOT anuric. Has been wearing the external collection device - poor fit, leakage. Currently just voiding in the bed and just did. Husband says he keeps bedside commode right beside her chair at home and helps her back and forth. She says her UOP is "more" past couple of days than on admission. Breathing some better since thoracentesis. No anorexia or nausea.   Notably pt's opinion has vacillated regarding eventual renal replacement therapy. At her August appt with Dr. Marval Regal indicated was not interested in pursuing AVF/AVG at that time, but states would do HD if she needed to.   Creatinine summary Date/Time Value  04/03/2017 04:14 AM 3.64 (H)  04/02/2017 06:10 AM 3.65 (H)  04/01/2017 05:08 AM 3.60 (H)  03/31/2017  03:38 AM 3.33 (H)  03/30/2017 04:11 AM 3.01 (H)  03/29/2017 09:47 AM 3.10 (H)  03/27/2017 03:59 PM 3.01 (H)  03/27/2017 12:24 PM 3.18 (H)  03/14/2017 10:12 AM 3.42 (H)  01/31/2017 02:01 PM 3.07 (H)  09/28/2016 02:38 PM 2.19 (H)  09/10/2016 10:55 AM 2.32 (H)  09/05/2016 08:57 AM 3.02 (H)  09/04/2016 10:44 AM 2.75 (H)  08/27/2016 10:55 AM 3.02 (H)  04/27/2016 10:31 AM 3.73 (H)  04/23/2016 11:47 AM 3.74 (H)  04/20/2016 11:24 AM 3.43 (H)  04/19/2016 09:12 AM 3.32 (H)   Past Medical History:  Diagnosis Date  . AICD (automatic cardioverter/defibrillator) present   . Anemia, unspecified 12/13/2013  . Back pain, chronic    "just when I walk; mass on 3rd and 4th vertebrae right lower back"  . Biventricular implantable cardiac defibrillator in situ 2007, 2012   a. 2007;  b. 2012 Gen change: SJM 3231-40 Uni BiV ICD, ser # [id number].  Marland Kitchen CAD (coronary artery disease)    a. 05/2001 CABG x4: LIMA->LAD, VG->D1, VG->D2, VG->RCA.  . Cardiomyopathy, ischemic    a. 2012 s/p SJM 3231-40 Uni BiV ICD, ser # [id number].  . Carotid stenosis    a. 10/19/2011 carotid duplex - Mild hard plaque bilaterally. Stable 40-59% bilateral ICA stenosis. Carotid US (04/2013):  Bilateral 40-59% ICA.  F/u 1 year  . Cerumen impaction 10/28/2015  . Chronic combined systolic and diastolic CHF (congestive heart failure) (HCC)    a. EF as low as 25% in 2006;  b. EF 60-65% in  12/2011;  c. 02/2013 Echo: EF 35-40, mod mid-dist antsept HK, Gr 2 DD, mild LVH.  . CKD (chronic kidney disease), stage IV ([place])    hx/notes 09/04/2016  . COPD (chronic obstructive pulmonary disease) ([place])   . COPD, severe ([place]) 01/22/2016  . 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 Dyslipidemia   . Gout ~ 08/2011  . History of vertebral fracture 09/18/2016  . Hypercalcemia 09/26/2013  . Hypertension   . Hypothyroidism   . Hypoxemia requiring supplemental oxygen   . LBBB (left bundle branch block)    . Lumbar compression fracture ([place])    L2/notes 09/04/2016  . Morbid obesity with BMI of 45.0-49.9, adult (HCC)    Ht. 5'4". BMI 47.2  . Mural thrombus of left ventricle    before 2003, while on Coumadin, No h/o CVA  . Non-Hodgkin's lymphoma of inguinal region ([place]) 02/2009   mass; left; B-type; Dr. Benay Spice, in remission  . On home oxygen therapy    "normally 2-3L; 24/7" (12/14/2015); "2L; 24/7" (09/04/2016)  . OSA on CPAP   . PAF (paroxysmal atrial fibrillation) (HCC)    on Coumadin  . Presence of permanent cardiac pacemaker   . Retinopathy due to secondary diabetes ([place])    type II, uncontrolled  . Urinary incontinence 06/25/2016  . Vertebral fracture, osteoporotic ([place]) 08/27/2016  . Vitamin D deficiency 06/09/2012   historical    Past Surgical History:  Procedure Laterality Date  . ABDOMINAL HYSTERECTOMY  1982  . CARDIAC CATHETERIZATION  06/03/01  . CARDIOVERSION N/A 07/30/2014   Procedure: CARDIOVERSION;  Surgeon: Jolaine Artist, MD;  Location[facility] END[facility]COPY;  Service: Cardiovascular;  Laterality: N/A;  . CARDIOVERSION N/A 11/25/2014   Procedure: CARDIOVERSION;  Surgeon: Evans Lance, MD;  Location: [place];  Service: Cardiovascular;  Laterality: N/A;  . CATARACT EXTRACTION     left eye  . CHOLECYSTECTOMY  1970  . CORONARY ARTERY BYPASS GRAFT  2003   CABG X4  . INSERT / REPLACE / REMOVE PACEMAKER  2007; 2012   w/AICD  . IR KYPHO LUMBAR INC FX REDUCE BONE BX UNI/BIL CANNULATION INC/IMAGING  09/11/2016  . IR THORACENTESIS ASP PLEURAL SPACE W/IMG GUIDE  04/02/2017  . REFRACTIVE SURGERY     right eye  . TEE WITHOUT CARDIOVERSION N/A 12/16/2015   Procedure: TRANSESOPHAGEAL ECHOCARDIOGRAM (TEE);  Surgeon: Jolaine Artist, MD;  Location: [facility] ENDOSCOPY;  Service: Cardiovascular;  Laterality: N/A;  . TUBAL LIGATION  1972    Family History  Problem Relation Age of Onset  . Kidney cancer Mother        kidney and female repo - died @ 46  . Asthma Mother   . Cancer Mother         gyn and renal  . Heart disease Mother        CAD  . Heart disease Father        died @ 10  . Stroke Father   . Diabetes Father   . Hypertension Father   . Hyperlipidemia Father   . Heart attack Father   . Hypertension Son   . Kidney cancer Maternal Uncle   . Cancer Maternal Uncle   . Cirrhosis Maternal Grandmother        non alcohol  . Cancer Maternal Grandfather   . Kidney cancer Maternal Grandfather   . Hypertension Maternal Aunt    Social History:  reports that  has never smoked. she has  never used smokeless tobacco. She reports that she does not drink alcohol or use drugs.   Allergies  Allergen Reactions  . Sulfonamide Derivatives Other (See Comments)    Reaction unknown; "childhood allergy/mother"     Prior to Admission medications   Medication Sig Start Date End Date Taking? Authorizing Provider  acetaminophen (TYLENOL) 500 MG tablet Take 1,000 mg every 6 (six) hours as needed by mouth for mild pain or headache.    Yes [provider]  albuterol (PROVENTIL HFA;VENTOLIN HFA) 108 (90 BASE) MCG/ACT inhaler Inhale 2 puffs into the lungs every 6 (six) hours as needed for wheezing or shortness of breath. 12/26/12  Yes Mosie Lukes, MD  amiodarone (PACERONE) 200 MG tablet Take 200 mg by mouth daily.   Yes [provider]  atorvastatin (LIPITOR) 10 MG tablet Take 1 tablet (10 mg total) by mouth daily. 02/15/17  Yes Bensimhon, Shaune Pascal, MD  carvedilol (COREG) 12.5 MG tablet Take 1 tablet (12.5 mg total) by mouth 2 (two) times daily. 02/15/17  Yes Bensimhon, Shaune Pascal, MD  dicyclomine (BENTYL) 20 MG tablet Take 1 tablet (20 mg total) by mouth 2 (two) times daily as needed for spasms. 09/28/16  Yes Julianne Rice, MD  diphenoxylate-atropine (LOMOTIL) 2.5-0.025 MG tablet Take 1 tablet 4 (four) times daily as needed by mouth for diarrhea or loose stools. 03/22/17  Yes Mosie Lukes, MD  escitalopram (LEXAPRO) 10 MG tablet TAKE 1 TABLET EVERY DAY Patient  taking differently: Take 10 mg by mouth daily 11/15/16  Yes Mosie Lukes, MD  fluticasone (FLONASE) 50 MCG/ACT nasal spray Place 2 sprays into both nostrils daily as needed for allergies or rhinitis. As needed for nasal stuffiness. Patient taking differently: Place 2 sprays daily as needed into both nostrils for allergies or rhinitis.  01/13/14  Yes Mosie Lukes, MD  gabapentin (NEURONTIN) 300 MG capsule Take 1 capsule (300 mg total) by mouth at bedtime. 01/29/17  Yes Mosie Lukes, MD  glucose blood (FREESTYLE LITE) test strip DX: 250.60  Check sugars bid and as needed 05/22/13  Yes Mosie Lukes, MD  insulin NPH (HUMULIN N,NOVOLIN N) 100 UNIT/ML injection Inject 15 Units daily before breakfast into the skin.    Yes [provider]  levothyroxine (SYNTHROID, LEVOTHROID) 88 MCG tablet Take 1 tablet (88 mcg total) by mouth daily. 02/01/17  Yes Mosie Lukes, MD  OXYGEN Inhale 3 L continuous into the lungs.    Yes [provider]  torsemide (DEMADEX) 20 MG tablet Take 20 mg See admin instructions by mouth. Friday, Saturday, Sunday take 40 mg Monday- Thursday ( 20 mg )   Yes [provider]  warfarin (COUMADIN) 5 MG tablet TAKE AS DIRECTED BY ANTICOAGULATION CLINIC. Patient taking differently: Take 5 mg by mouth daily on Monday. Take 2.5 mg by mouth daily on all other days 10/15/16  Yes Evans Lance, MD  metolazone (ZAROXOLYN) 2.5 MG tablet Take 1 tablet (2.5 mg total) by mouth as needed (for weight greater than 235 lb). Patient not taking: Reported on 03/28/2017 02/26/17   Bensimhon, Shaune Pascal, MD    Inpatient medications: . amiodarone  200 mg Oral Daily  . atorvastatin  10 mg Oral q1800  . carvedilol  12.5 mg Oral BID  . escitalopram  10 mg Oral Daily  . gabapentin  300 mg Oral QHS  . insulin NPH Human  15 Units Subcutaneous QAC breakfast  . ipratropium-albuterol  3 mL Nebulization Q6H  . levothyroxine  88 mcg Oral QAC breakfast  . sodium chloride  3 mL  Nebulization BID  . sodium chloride flush  3 mL Intravenous Q12H  . torsemide  20 mg Oral Once per day on Mon Tue Wed Thu  . Warfarin - Pharmacist Dosing Inpatient   Does not apply q1800    Review of Systems Per HPI   Physical Exam:  Blood pressure (!) 119/56, pulse 70, temperature (!) 97.4 F (36.3 C), temperature source Axillary, resp. rate 20, height 5\' 4"  (1.626 m), weight 109.1 kg (240 lb 8.4 oz), SpO2 96 %.  Gen: Pale appearing WF, wearing her BIPAP unit - removed. Very pleasant, soft spoken (husbadn in with her) Lines/tubes: PIV only Skin: no rash, cyanosis L [place] ICD non inflamed non tender Neck: + JVD Chest:  Breath sounds still diminished R chest, left no def crackles Heart: S1S2 No S3 Abdomen obese, large pannus, no focal tenderness 1+ pitting posterior thigh edema, trace r>L pretibial edema Long scar healed RLE (old V harvest site) Neuro: alert, Ox3, no focal deficit  Recent Labs  Lab 03/27/17 1224 03/27/17 1559 03/29/17 0947 03/30/17 0411 03/31/17 0338 04/01/17 0508 04/02/17 0610 04/03/17 0414  NA 134* 137 132* 133* 131* 132* 130* 128*  K 4.8 4.9 5.6* 5.1 5.3* 4.9 5.1 5.1  CL 104 103 102 101 99* 101 99* 97*  CO2 24 24 24 23 23  20* 22 22  GLUCOSE 141* 137* 169* 118* 137* 113* 205* 328*  BUN 57* 59* 60* 61* 66* 69* 74* 85*  CREATININE 3.18* 3.01* 3.10* 3.01* 3.33* 3.60* 3.65* 3.64*  CALCIUM 8.6* 8.7 8.5* 8.5* 8.4* 8.6* 8.3* 8.3*  PHOS 2.8  --   --   --   --   --   --   --     Recent Labs  Lab 03/27/17 1224  ALBUMIN 2.8*    Recent Labs  Lab 03/27/17 1559  03/31/17 0338 04/01/17 0508 04/02/17 0610 04/03/17 0414  WBC 4.8   < > 6.4 8.4 2.7* 3.2*  NEUTROABS 3.1  --   --   --   --   --   HGB 11.5   < > 10.5* 11.6* 10.4* 10.2*  HCT 35.4   < > 33.0* 35.2* 32.6* 31.5*  MCV 93   < > 91.2 90.5 90.1 89.0  PLT 96*   < > 104* 107* 85* 94*   < > = values in this interval not displayed.    Recent Labs  Lab 03/31/17 0743 03/31/17 1400 04/01/17 0855  04/02/17 0916 04/02/17 2058  GLUCAP 123* 185* 130* 216* 315*    Xrays/Other Studies: Dg Chest 1 View  Result Date: 04/02/2017 CLINICAL DATA:  Shortness of breath, post thoracentesis EXAM: CHEST 1 VIEW COMPARISON:  04/01/2017 FINDINGS: Small right pleural effusion.  No pneumothorax is seen. Left lung base is obscured. Cardiomegaly.  Mild interstitial edema. Left subclavian ICD. Median sternotomy. IMPRESSION: No pneumothorax is seen status post thoracentesis. Cardiomegaly with mild interstitial edema and small right pleural effusion. Electronically Signed   By: Julian Hy M.D.   On: 04/02/2017 13:03   Dg Chest Port 1 View  Result Date: 04/01/2017 CLINICAL DATA:  Shortness of breath, history of COPD, non-Hodgkin's lymphoma, previous CVA, diabetes, nonsmoker. EXAM: PORTABLE CHEST 1 VIEW COMPARISON:  Chest x-ray of March 29, 2017 FINDINGS: The lungs are mildly hypoinflated. The interstitial markings are increased bilaterally. There is a small right pleural effusion. The pulmonary vascularity is engorged. The cardiac silhouette is enlarged. The patient  has undergone previous CABG. The ICD is in stable position. There is calcification in the wall of the aortic arch. The observed bony thorax is unremarkable. IMPRESSION: CHF with mild to moderate interstitial edema. Small right pleural effusion. No definite pneumonia. Thoracic aortic atherosclerosis. Electronically Signed   By: David  Martinique M.D.   On: 04/01/2017 09:15   Ir Thoracentesis Asp Pleural Space W/img Guide  Result Date: 04/02/2017 INDICATION: Patient with right pleural effusion. Request is made for diagnostic and therapeutic right thoracentesis. EXAM: ULTRASOUND GUIDED DIAGNOSTIC AND THERAPEUTIC RIGHT THORACENTESIS MEDICATIONS: 10 mL 2% lidocaine COMPLICATIONS: None immediate. PROCEDURE: An ultrasound guided thoracentesis was thoroughly discussed with the patient and questions answered. The benefits, risks, alternatives and  complications were also discussed. The patient understands and wishes to proceed with the procedure. Written consent was obtained. Ultrasound was performed to localize and mark an adequate pocket of fluid in the right chest. The area was then prepped and draped in the normal sterile fashion. 2% Lidocaine was used for local anesthesia. Under ultrasound guidance a Safe-T-Centesis catheter was introduced. Thoracentesis was performed. The catheter was removed and a dressing applied. FINDINGS: A total of approximately 900 mL of hazy, yellow fluid was removed. Samples were sent to the laboratory as requested by the clinical team. IMPRESSION: Successful ultrasound guided diagnostic and therapeutic right thoracentesis yielding 900 mL of pleural fluid. Read by:  Brynda Greathouse PA-C Electronically Signed   By: Aletta Edouard M.D.   On: 04/02/2017 16:13    Background: 76 y.o. year-old w/complicated PMH DM, MO, h/o non-Hodgkins's lymphoma, chronic systolic/diastolic HF with Right HF, PAF (chronic anticoagulation), OHS/OSA on NIMV, pacer/ICD, CAD prior CABG, HLD, anemia on Aranesp. Known advanced CKD Stage 5 (GFR baseline 12-13 at last [facility] labs 02/19/18) followed by Dr. Marval Regal. Multiple prior AKI on CKD usually cardiorenal. Admitted 03/30/17 w/ progressive SOB, bilateral pleural effusions on CXR + R PNA. R thoracentesis of 900 ml on 11/19 with some relief in breathing.  Creatinine on admission 3 (better than most recent baseline) up to 3.6 or so since 11/19, reported to be anuric though I/O records have been somewhat in doubt. Only modest doses IV lasix. Now mild hyponatremia and K around 5.1. Weight up 3 kg since admission. We are asked to see.   Assessment/Recommendations  1. CKD 5 - pt clearly NOT anuric, but unable to accurately assess UOP and pt in need of some diuresis. Creatinine is at her most recent baseline (3.6 October with Dr. Geanie Kenning) 1. Favor placement of foley catheter for just a couple of days as we  give some sl higher IV lasix doses. 2. Trend creatinine. Has had GFR <15 for some time now, is without dialysis indications currently, hard to know which way renal fx will go with diuresis (but if HF is a part of the picture could actually improve with diuresis) 3. Went over with pt and husband again the merits of having AVF in place ahead of need for HD and she will think on it 2. Systolic and diastolic HF with ECFV overload - Pt about 6 lb above her usual "dry" weight and with some edema. CXR post thoracentesis improved aeration but still with interstitial edema.  1. Hold po torsemide.  2. Lasix 120 IV BID for 2 doses and assess UOP response.   3. Hypervolemic hyponatremia - should improve with diuresis 4. Hyperkalemia - mild. K restrict (renal diet) 5. Acute on chronic hypoxic resp failure. Multifactorial. Volume component as addressed above. Some improvement post thoraccentesis  6. CAP  - completed 4 days rocephin and azithro, CXR 11/20 no infiltrate 7. Anemia  - 2/2 CKD. s/p Aranesp 60 (gets Q[facility] at [facility])  on 03/27/17 and recent dose Feraheme. Hb down some since adm but has not dropped under 10 8. Diabetes - per primary service  Jamal Maes,  MD [facility] [phone] pager 04/03/2017, 6:25 AM

## 2017-04-04 DIAGNOSIS — N185 Chronic kidney disease, stage 5: Secondary | ICD-10-CM

## 2017-04-04 LAB — PROTIME-INR
INR: 3.52
PROTHROMBIN TIME: 35 s — AB (ref 11.4–15.2)

## 2017-04-04 LAB — RENAL FUNCTION PANEL
ALBUMIN: 2.5 g/dL — AB (ref 3.5–5.0)
Anion gap: 8 (ref 5–15)
BUN: 92 mg/dL — AB (ref 6–20)
CALCIUM: 8.1 mg/dL — AB (ref 8.9–10.3)
CO2: 22 mmol/L (ref 22–32)
CREATININE: 3.52 mg/dL — AB (ref 0.44–1.00)
Chloride: 97 mmol/L — ABNORMAL LOW (ref 101–111)
GFR, EST AFRICAN AMERICAN: 14 mL/min — AB (ref >60)
GFR, EST NON AFRICAN AMERICAN: 12 mL/min — AB (ref >60)
Glucose, Bld: 394 mg/dL — ABNORMAL HIGH (ref 65–99)
Phosphorus: 4.5 mg/dL (ref 2.5–4.6)
Potassium: 5.1 mmol/L (ref 3.5–5.1)
SODIUM: 127 mmol/L — AB (ref 135–145)

## 2017-04-04 LAB — GLUCOSE, CAPILLARY
GLUCOSE-CAPILLARY: 346 mg/dL — AB (ref 65–99)
GLUCOSE-CAPILLARY: 113 mg/dL — AB (ref 65–99)
Glucose-Capillary: 285 mg/dL — ABNORMAL HIGH (ref 65–99)
Glucose-Capillary: 174 mg/dL — ABNORMAL HIGH (ref 65–99)

## 2017-04-04 LAB — PATHOLOGIST SMEAR REVIEW

## 2017-04-04 MED ORDER — FUROSEMIDE 10 MG/ML IJ SOLN
160.0000 mg | Freq: Three times a day (TID) | INTRAVENOUS | Status: DC
  Administered 2017-04-04 – 2017-04-06 (×5): 160 mg via INTRAVENOUS
  Filled 2017-04-04: qty 16
  Filled 2017-04-04: qty 2
  Filled 2017-04-04 (×2): qty 16
  Filled 2017-04-04: qty 10
  Filled 2017-04-04 (×2): qty 16

## 2017-04-04 MED ORDER — INSULIN ASPART 100 UNIT/ML ~~LOC~~ SOLN
0.0000 [IU] | Freq: Three times a day (TID) | SUBCUTANEOUS | Status: DC
  Administered 2017-04-04: 3 [IU] via SUBCUTANEOUS
  Administered 2017-04-04: 8 [IU] via SUBCUTANEOUS
  Administered 2017-04-04: 11 [IU] via SUBCUTANEOUS
  Administered 2017-04-06 (×2): 3 [IU] via SUBCUTANEOUS

## 2017-04-04 MED ORDER — INSULIN ASPART 100 UNIT/ML ~~LOC~~ SOLN
0.0000 [IU] | Freq: Every day | SUBCUTANEOUS | Status: DC
  Administered 2017-04-05: 2 [IU] via SUBCUTANEOUS

## 2017-04-04 MED ORDER — METOLAZONE 5 MG PO TABS
10.0000 mg | ORAL_TABLET | Freq: Once | ORAL | Status: AC
  Administered 2017-04-04: 10 mg via ORAL
  Filled 2017-04-04: qty 2

## 2017-04-04 MED ORDER — INSULIN NPH (HUMAN) (ISOPHANE) 100 UNIT/ML ~~LOC~~ SUSP
20.0000 [IU] | Freq: Every day | SUBCUTANEOUS | Status: DC
  Administered 2017-04-04: 20 [IU] via SUBCUTANEOUS
  Filled 2017-04-04: qty 10

## 2017-04-04 MED ORDER — LOPERAMIDE HCL 2 MG PO CAPS
2.0000 mg | ORAL_CAPSULE | Freq: Every day | ORAL | Status: DC
  Administered 2017-04-04 – 2017-04-15 (×9): 2 mg via ORAL
  Filled 2017-04-04 (×13): qty 1

## 2017-04-04 NOTE — Progress Notes (Signed)
Pt UOP cont to have low UOP, 200 ml for this shift, increased SOB , per-orbital edema noted, MD notified, order for QHS bi-pap received, pt and spouse aware of new orders.

## 2017-04-04 NOTE — Progress Notes (Signed)
[facility] Rounding Note  Subjective/Interval History:  Not diuresing well at all on 120 BID lasix Having her usual post prandial loose stools (takes daily imodium at home) Dyspneic after getting cleaned up this AM  Objective Vital signs in last 24 hours: Vitals:   04/04/17 0009 04/04/17 0242 04/04/17 0542 04/04/17 0920  BP:   (!) 118/56   Pulse: 70 70 76   Resp: (!) 21 18 (!) 21   Temp:   (!) 97.4 F (36.3 C)   TempSrc:   Oral   SpO2: 93% 94% 93% 97%  Weight:   109.9 kg (242 lb 4.6 oz)   Height:       Weight change: 0.8 kg (1 lb 12.2 oz)  Intake/Output Summary (Last 24 hours) at 04/04/2017 1103 Last data filed at 04/04/2017 0900 Gross per 24 hour  Intake 540 ml  Output 350 ml  Net 190 ml   Physical Exam:  Blood pressure (!) 118/56, pulse 76, temperature (!) 97.4 F (36.3 C), temperature source Oral, resp. rate (!) 21, height 5\' 4"  (1.626 m), weight 109.9 kg (242 lb 4.6 oz), SpO2 97 %.  Pale appearing WF, some ^ WOB Lines/tubes: PIV only Skin: no rash, cyanosis L [place] ICD non inflamed non tender Neck: + JVD Chest:  Breath sounds still diminished R chest, left no def crackles Heart: S1S2 No S3 Abdomen obese, large pannus, no focal tenderness 1+ pitting posterior thigh edema, trace r>L pretibial edema Long scar healed RLE (old V harvest site) with edema of scar  Recent Labs  Lab 03/29/17 0947 03/30/17 0411 03/31/17 0338 04/01/17 0508 04/02/17 0610 04/03/17 0414 04/04/17 0329  NA 132* 133* 131* 132* 130* 128* 127*  K 5.6* 5.1 5.3* 4.9 5.1 5.1 5.1  CL 102 101 99* 101 99* 97* 97*  CO2 24 23 23  20* 22 22 22   GLUCOSE 169* 118* 137* 113* 205* 328* 394*  BUN 60* 61* 66* 69* 74* 85* 92*  CREATININE 3.10* 3.01* 3.33* 3.60* 3.65* 3.64* 3.52*  CALCIUM 8.5* 8.5* 8.4* 8.6* 8.3* 8.3* 8.1*  PHOS  --   --   --   --   --   --  4.5    Recent Labs  Lab 04/04/17 0329  ALBUMIN 2.5*    Recent Labs  Lab 03/31/17 0338 04/01/17 0508 04/02/17 0610 04/03/17 0414  WBC 6.4 8.4  2.7* 3.2*  HGB 10.5* 11.6* 10.4* 10.2*  HCT 33.0* 35.2* 32.6* 31.5*  MCV 91.2 90.5 90.1 89.0  PLT 104* 107* 85* 94*    Recent Labs  Lab 04/02/17 0916 04/02/17 2058 04/03/17 0752 04/03/17 1141 04/04/17 0745  GLUCAP 216* 315* 334* 387* 346*   Studies/Results: Dg Chest 1 View  Result Date: 04/02/2017 CLINICAL DATA:  Shortness of breath, post thoracentesis EXAM: CHEST 1 VIEW COMPARISON:  04/01/2017 FINDINGS: Small right pleural effusion.  No pneumothorax is seen. Left lung base is obscured. Cardiomegaly.  Mild interstitial edema. Left subclavian ICD. Median sternotomy. IMPRESSION: No pneumothorax is seen status post thoracentesis. Cardiomegaly with mild interstitial edema and small right pleural effusion. Electronically Signed   By: Julian Hy M.D.   On: 04/02/2017 13:03   Ir Thoracentesis Asp Pleural Space W/img Guide  Result Date: 04/02/2017 INDICATION: Patient with right pleural effusion. Request is made for diagnostic and therapeutic right thoracentesis. EXAM: ULTRASOUND GUIDED DIAGNOSTIC AND THERAPEUTIC RIGHT THORACENTESIS MEDICATIONS: 10 mL 2% lidocaine COMPLICATIONS: None immediate. PROCEDURE: An ultrasound guided thoracentesis was thoroughly discussed with the patient and questions answered. The benefits, risks,  alternatives and complications were also discussed. The patient understands and wishes to proceed with the procedure. Written consent was obtained. Ultrasound was performed to localize and mark an adequate pocket of fluid in the right chest. The area was then prepped and draped in the normal sterile fashion. 2% Lidocaine was used for local anesthesia. Under ultrasound guidance a Safe-T-Centesis catheter was introduced. Thoracentesis was performed. The catheter was removed and a dressing applied. FINDINGS: A total of approximately 900 mL of hazy, yellow fluid was removed. Samples were sent to the laboratory as requested by the clinical team. IMPRESSION: Successful  ultrasound guided diagnostic and therapeutic right thoracentesis yielding 900 mL of pleural fluid. Read by:  Brynda Greathouse PA-C Electronically Signed   By: Aletta Edouard M.D.   On: 04/02/2017 16:13   Medications: . sodium chloride     . amiodarone  200 mg Oral Daily  . atorvastatin  10 mg Oral q1800  . carvedilol  12.5 mg Oral BID  . escitalopram  10 mg Oral Daily  . gabapentin  300 mg Oral QHS  . insulin aspart  0-15 Units Subcutaneous TID WC  . insulin aspart  0-5 Units Subcutaneous QHS  . insulin NPH Human  20 Units Subcutaneous QAC breakfast  . ipratropium-albuterol  3 mL Nebulization Q6H  . levothyroxine  88 mcg Oral QAC breakfast  . sodium chloride flush  3 mL Intravenous Q12H  . Warfarin - Pharmacist Dosing Inpatient   Does not apply q1800   Background: 76 y.o. year-old w/complicated PMH DM, MO, h/o non-Hodgkins's lymphoma, chronic systolic/diastolic HF with Right HF, PAF (chronic anticoagulation), OHS/OSA on NIMV, pacer/ICD, CAD prior CABG, HLD, anemia on Aranesp. Known advanced CKD Stage 5 (GFR baseline 12-13 at last [facility] labs 02/19/18) followed by Dr. Marval Regal. Multiple prior AKI on CKD usually cardiorenal. Admitted 03/30/17 w/ progressive SOB, bilateral pleural effusions on CXR + R PNA. R thoracentesis of 900 ml on 11/19.  Creatinine on admission 3 (better than most recent baseline) up to 3.6 or so since 11/19, reported to be anuric though I/O records have been somewhat in doubt. Only modest doses IV lasix. Mild hyponatremia and K around 5.1. Weight up 3 kg since admission. We are asked to see.  1. CKD 5 - Not anuric, but remains oliguric on lasix 120 BID. Renal fx no different. Creatinine is at her most recent baseline (3.6 October with Dr. Geanie Kenning) 1. Increase lasix 160 IV TID and add dose of po metolazone for today 2. Continue to trend creatinine, UOP 3. Advised pt and husband that if she proves to be a diuretic failure she may in fact, as noted by Dr. Lovena Le, need dialysis  sooner rather than later.  Has had GFR <15 for some time now, diuretic refractoriness could become her dialysis indication) 2. Systolic and diastolic HF with ECFV overload - Pt about 6 lb above her usual "dry" weight and with some edema. CXR post thoracentesis improved aeration but still with interstitial edema.  1. Holding po torsemide.  2. Lasix 120 IV BID for 2 doses inadequate UOP response - ^ 160 TID and add dose metolazone tdoay 3. Hypervolemic hyponatremia - should improve if diureses 4. Hyperkalemia - mild. K restrict (renal diet) 5. Acute on chronic hypoxic resp failure. Multifactorial. Volume component as addressed above. Some improvement post thoracentesis 6. CAP  - completed 4 days rocephin and azithro, CXR 11/20 no infiltrate 7. Anemia  - 2/2 CKD. s/p Aranesp 60 (gets Q4weeks at [facility])  on 03/27/17 and  recent dose Feraheme. Hb down some since adm but has not dropped under 10 8. Diabetes - per primary  9. Post prandial diarrhea - takes imodium at home. Ordered 10. H/o Non-Hodgkins lymphoma  Jamal Maes, MD [facility] [phone] pager 04/04/2017, 11:03 AM

## 2017-04-04 NOTE — Progress Notes (Signed)
PROGRESS NOTE    Destiny [name]  IWP:[id number] DOB: Aug 03, 1940 DOA: 03/29/2017 PCP: Mosie Lukes, MD      Brief Narrative:  76 yo F CHF EF 20% with AICD, COPD on home oxygen of 2 L, CKD stage IV, anticoagulation with Coumadin with prior LV mural thrombus, and DM who presents with few days dyspnea.   Treated with 4 days ceftriaxone and azithromycin, no improvement.  Attempted diuresis but very little urine output, poorly quantified.  Urine foley placed yesterday.     Assessment & Plan:  Principal Problem:   Acute on chronic combined systolic and diastolic CHF (congestive heart failure) (HCC) Active Problems:   Diabetes mellitus type 2 with retinopathy (HCC)   Hyperlipidemia   Essential hypertension   Automatic implantable cardioverter-defibrillator in situ   Hypothyroidism   Anemia   Morbid obesity (HCC)   COPD, severe (HCC)   CKD (chronic kidney disease) stage 4, GFR 15-29 ml/min ([place])   Community acquired pneumonia   Acute hypoxemic respiratory failure (HCC)   Pressure injury of skin   Acute on chronic hypoxic respiratory failure from congestive heart failure, pleural effusion Improved somewhat after thoracentesis yesterday.   -IV Lasix per nephrology -Hold other diuretics -Strict I/O with foley -Daily BMP -NIMV at night  CKD V not on HD Only about 15cc/hr urine yesterday since placement of foley. -Will defer augmenting diuresis to Nephrology  Acute on chronic systolic CHF -IV Lasix -Monitor respiratory status and UOP -Continue aspirin, statin, BB  Hyponatremia Hypervolemic, worsening again. -Daily BMP  Suspected Community Acquired pneumonia Completed course of antibiotics.  Pleural fluid with elevated LDH, low protein.  Meets Light's criteria, but suspect this is not parapneumonic.  Chronic COPD -Continue Solumedrol -Continue bronchodilators  Diabetes -Continue NPH, increase dose -Start SSI  Atrial fibrillation -Continue amiodarone, Coreg,  warfarin  Hypothyroidism -Continue levothyroxine  Other medications -Continue Bentyl -Continue Lexapro -Continue gabapentin     DVT prophylaxis: N/A Code Status: FULL Family Communication: Husband Disposition Plan: Carefully monitor UOP and continue diuresis.   Consultants:   Nephrology, EP  Procedures:   Thoracentesis  Antimicrobials:   Ceftraixone and azithromycin, completed 4 days    Subjective: Stable, no improvement in breathing.  Still no chest pain, fever, confusion, sputum production.    Objective: Vitals:   04/03/17 1939 04/04/17 0009 04/04/17 0242 04/04/17 0542  BP:    (!) 118/56  Pulse: 72 70 70 76  Resp: 19 (!) 21 18 (!) 21  Temp:    (!) 97.4 F (36.3 C)  TempSrc:    Oral  SpO2: 96% 93% 94% 93%  Weight:    109.9 kg (242 lb 4.6 oz)  Height:        Intake/Output Summary (Last 24 hours) at 04/04/2017 0755 Last data filed at 04/04/2017 0748 Gross per 24 hour  Intake 660 ml  Output 350 ml  Net 310 ml   Filed Weights   04/02/17 0342 04/03/17 0337 04/04/17 0542  Weight: 109.1 kg (240 lb 8.4 oz) 109.1 kg (240 lb 8.4 oz) 109.9 kg (242 lb 4.6 oz)    Examination: General appearance: Elderly obese adult female, alert and appears somewhat Oout of breath.   HEENT: Anicteric, conjunctiva pink, lids and lashes normal. No nasal deformity, discharge, epistaxis.  Lips moist.   Skin: Warm and dry.  No jaundice.  No suspicious rashes or lesions. Cardiac: RRR, nl S1-S2, no murmurs appreciated.  Capillary refill is brisk.  JVP not visible.  Trace LE edema.  Radia  pulses 2+ and symmetric. Respiratory: Breathless, tachypneic.  CTAB without rales or wheezes. Abdomen: Abdomen soft.  No TTP. No ascites, distension, hepatosplenomegaly.   MSK: No deformities or effusions. Neuro: Awake and alert.  EOMI, moves all extremities. Speech fluent.    Psych: Sensorium intact and responding to questions, attention normal. Affect normal.  Judgment and insight appear  normal.    Data Reviewed: I have personally reviewed following labs and imaging studies:  CBC: Recent Labs  Lab 03/29/17 0947 03/31/17 0338 04/01/17 0508 04/02/17 0610 04/03/17 0414  WBC 6.6 6.4 8.4 2.7* 3.2*  HGB 11.2* 10.5* 11.6* 10.4* 10.2*  HCT 35.2* 33.0* 35.2* 32.6* 31.5*  MCV 91.7 91.2 90.5 90.1 89.0  PLT 94* 104* 107* 85* 94*   Basic Metabolic Panel: Recent Labs  Lab 03/31/17 0338 04/01/17 0508 04/02/17 0610 04/03/17 0414 04/04/17 0329  NA 131* 132* 130* 128* 127*  K 5.3* 4.9 5.1 5.1 5.1  CL 99* 101 99* 97* 97*  CO2 23 20* 22 22 22   GLUCOSE 137* 113* 205* 328* 394*  BUN 66* 69* 74* 85* 92*  CREATININE 3.33* 3.60* 3.65* 3.64* 3.52*  CALCIUM 8.4* 8.6* 8.3* 8.3* 8.1*  PHOS  --   --   --   --  4.5   GFR: Estimated Creatinine Clearance: 17 mL/min (A) (by C-G formula based on SCr of 3.52 mg/dL (H)). Liver Function Tests: Recent Labs  Lab 04/04/17 0329  ALBUMIN 2.5*   No results for input(s): LIPASE, AMYLASE in the last 168 hours. No results for input(s): AMMONIA in the last 168 hours. Coagulation Profile: Recent Labs  Lab 03/31/17 0338 04/01/17 1616 04/02/17 0610 04/03/17 0414 04/04/17 0329  INR 2.96 3.53 3.86 3.80 3.52   Cardiac Enzymes: No results for input(s): CKTOTAL, CKMB, CKMBINDEX, TROPONINI in the last 168 hours. BNP (last 3 results) No results for input(s): PROBNP in the last 8760 hours. HbA1C: No results for input(s): HGBA1C in the last 72 hours. CBG: Recent Labs  Lab 04/02/17 0916 04/02/17 2058 04/03/17 0752 04/03/17 1141 04/04/17 0745  GLUCAP 216* 315* 334* 387* 346*   Lipid Profile: No results for input(s): CHOL, HDL, LDLCALC, TRIG, CHOLHDL, LDLDIRECT in the last 72 hours. Thyroid Function Tests: No results for input(s): TSH, T4TOTAL, FREET4, T3FREE, THYROIDAB in the last 72 hours. Anemia Panel: No results for input(s): VITAMINB12, FOLATE, FERRITIN, TIBC, IRON, RETICCTPCT in the last 72 hours. Urine analysis:     Component Value Date/Time   COLORURINE YELLOW 03/29/2017 2348   APPEARANCEUR HAZY (A) 03/29/2017 2348   LABSPEC 1.013 03/29/2017 2348   PHURINE 5.0 03/29/2017 2348   GLUCOSEU NEGATIVE 03/29/2017 2348   GLUCOSEU NEGATIVE 06/25/2016 1002   HGBUR NEGATIVE 03/29/2017 2348   HGBUR large 05/17/2008 1325   BILIRUBINUR NEGATIVE 03/29/2017 [place] 03/29/2017 2348   PROTEINUR NEGATIVE 03/29/2017 2348   UROBILINOGEN 0.2 06/25/2016 1002   NITRITE NEGATIVE 03/29/2017 2348   LEUKOCYTESUR LARGE (A) 03/29/2017 2348   Sepsis Labs: @LABRCNTIP (procalcitonin:4,lacticidven:4)  ) Recent Results (from the past 240 hour(s))  Culture, blood (routine x 2) Call MD if unable to obtain prior to antibiotics being given     Status: None   Collection Time: 03/29/17  3:45 PM  Result Value Ref Range Status   Specimen Description BLOOD LEFT ANTECUBITAL  Final   Special Requests   Final    BOTTLES DRAWN AEROBIC AND ANAEROBIC Blood Culture adequate volume   Culture NO GROWTH 5 DAYS  Final   Report Status 04/03/2017 FINAL  Final  Culture, blood (routine x 2) Call MD if unable to obtain prior to antibiotics being given     Status: None   Collection Time: 03/29/17  4:10 PM  Result Value Ref Range Status   Specimen Description BLOOD RIGHT ARM  Final   Special Requests   Final    BOTTLES DRAWN AEROBIC AND ANAEROBIC Blood Culture adequate volume   Culture NO GROWTH 5 DAYS  Final   Report Status 04/03/2017 FINAL  Final  MRSA PCR Screening     Status: None   Collection Time: 03/29/17  6:46 PM  Result Value Ref Range Status   MRSA by PCR NEGATIVE NEGATIVE Final    Comment:        The GeneXpert MRSA Assay (FDA approved for NASAL specimens only), is one component of a comprehensive MRSA colonization surveillance program. It is not intended to diagnose MRSA infection nor to guide or monitor treatment for MRSA infections.   C difficile quick scan w PCR reflex     Status: None   Collection  Time: 03/30/17 11:39 PM  Result Value Ref Range Status   C Diff antigen NEGATIVE NEGATIVE Final   C Diff toxin NEGATIVE NEGATIVE Final   C Diff interpretation No C. difficile detected.  Final  Giardia/Cryptosporidium EIA     Status: None   Collection Time: 03/30/17 11:39 PM  Result Value Ref Range Status   Giardia Ag, Stl Negative Negative Final   Cryptosporidium EIA Negative Negative Final    Comment: (NOTE) Performed At: [facility] [place], [place] [id number] Rush Farmer MD CX:[id number]    Source of Sample STOOL  Final  Culture, body fluid-bottle     Status: None (Preliminary result)   Collection Time: 04/02/17 12:59 PM  Result Value Ref Range Status   Specimen Description FLUID RIGHT PLEURAL  Final   Special Requests NONE  Final   Culture NO GROWTH 1 DAY  Final   Report Status PENDING  Incomplete  Gram stain     Status: None   Collection Time: 04/02/17 12:59 PM  Result Value Ref Range Status   Specimen Description FLUID RIGHT PLEURAL  Final   Special Requests NONE  Final   Gram Stain NO WBC SEEN NO ORGANISMS SEEN   Final   Report Status 04/02/2017 FINAL  Final         Radiology Studies: Dg Chest 1 View  Result Date: 04/02/2017 CLINICAL DATA:  Shortness of breath, post thoracentesis EXAM: CHEST 1 VIEW COMPARISON:  04/01/2017 FINDINGS: Small right pleural effusion.  No pneumothorax is seen. Left lung base is obscured. Cardiomegaly.  Mild interstitial edema. Left subclavian ICD. Median sternotomy. IMPRESSION: No pneumothorax is seen status post thoracentesis. Cardiomegaly with mild interstitial edema and small right pleural effusion. Electronically Signed   By: Julian Hy M.D.   On: 04/02/2017 13:03   Ir Thoracentesis Asp Pleural Space W/img Guide  Result Date: 04/02/2017 INDICATION: Patient with right pleural effusion. Request is made for diagnostic and therapeutic right thoracentesis. EXAM: ULTRASOUND GUIDED DIAGNOSTIC AND  THERAPEUTIC RIGHT THORACENTESIS MEDICATIONS: 10 mL 2% lidocaine COMPLICATIONS: None immediate. PROCEDURE: An ultrasound guided thoracentesis was thoroughly discussed with the patient and questions answered. The benefits, risks, alternatives and complications were also discussed. The patient understands and wishes to proceed with the procedure. Written consent was obtained. Ultrasound was performed to localize and mark an adequate pocket of fluid in the right chest. The area was then prepped and draped in the normal sterile  fashion. 2% Lidocaine was used for local anesthesia. Under ultrasound guidance a Safe-T-Centesis catheter was introduced. Thoracentesis was performed. The catheter was removed and a dressing applied. FINDINGS: A total of approximately 900 mL of hazy, yellow fluid was removed. Samples were sent to the laboratory as requested by the clinical team. IMPRESSION: Successful ultrasound guided diagnostic and therapeutic right thoracentesis yielding 900 mL of pleural fluid. Read by:  Brynda Greathouse PA-C Electronically Signed   By: Aletta Edouard M.D.   On: 04/02/2017 16:13        Scheduled Meds: . amiodarone  200 mg Oral Daily  . atorvastatin  10 mg Oral q1800  . carvedilol  12.5 mg Oral BID  . escitalopram  10 mg Oral Daily  . gabapentin  300 mg Oral QHS  . insulin NPH Human  15 Units Subcutaneous QAC breakfast  . ipratropium-albuterol  3 mL Nebulization Q6H  . levothyroxine  88 mcg Oral QAC breakfast  . sodium chloride flush  3 mL Intravenous Q12H  . Warfarin - Pharmacist Dosing Inpatient   Does not apply q1800   Continuous Infusions: . sodium chloride       LOS: 6 days    Time spent: 20 minutes    Edwin Dada, MD [facility] Pager [phone]  If 7PM-7AM, please contact night-coverage [email or web address] Password [facility] 04/04/2017, 7:55 AM

## 2017-04-04 NOTE — Progress Notes (Signed)
ANTICOAGULATION CONSULT NOTE - Follow Up Consult  Pharmacy Consult for Warfarin Indication: atrial fibrillation and mural thrombus  Allergies  Allergen Reactions  . Sulfonamide Derivatives Other (See Comments)    Reaction unknown; "childhood allergy/mother"    Patient Measurements: Height: 5\' 4"  (162.6 cm) Weight: 242 lb 4.6 oz (109.9 kg) IBW/kg (Calculated) : 54.7  Vital Signs: Temp: 97.4 F (36.3 C) (11/22 0542) Temp Source: Oral (11/22 0542) BP: 118/56 (11/22 0542) Pulse Rate: 76 (11/22 0542)  Labs: Recent Labs    04/02/17 0610 04/03/17 0414 04/04/17 0329  HGB 10.4* 10.2*  --   HCT 32.6* 31.5*  --   PLT 85* 94*  --   LABPROT 37.6* 37.2* 35.0*  INR 3.86 3.80 3.52  CREATININE 3.65* 3.64* 3.52*    Estimated Creatinine Clearance: 17 mL/min (A) (by C-G formula based on SCr of 3.52 mg/dL (H)).   Medications:  Coumadin 2.5mg  daily given 11/17 - 11/18  Assessment: 76 year old female admitted with shortness of breath. She is on chronic anticoagulation with Coumadin for atrial fibrillation and history of a mural thrombus. Her INR remains supratherapeutic today, likely due to low flow from CHF and acute illness.  Her last Coumadin dose was given on 11/18. Her hemoglobin and platelet count are low. No bleeding complications reported.  Goal of Therapy:  INR 2-3 Monitor platelets by anticoagulation protocol: Yes   Plan:  No Coumadin today Daily PT/INR monitoring Monitor for bleeding complications  Jacobb Alen A Princella Jaskiewicz 04/04/2017,11:06 AM

## 2017-04-04 NOTE — Progress Notes (Signed)
Start of the shift pt started to desaturate and call was made to respiratory for bipap orders. Rt at bedside with V60 machine and placed pt on 12/6, 40% pt is saturating 97% and in no distress and looks much comfortable at this time. RT will continue to follow per protocol.

## 2017-04-04 NOTE — Progress Notes (Signed)
Progress Note  Patient Name: Destiny [name] Date of Encounter: 04/04/2017  Primary Cardiologist: Dr. Reine Just  Subjective   Dyspnea about the same. Not much response to diuretics at least based on her weight  Inpatient Medications    Scheduled Meds: . amiodarone  200 mg Oral Daily  . atorvastatin  10 mg Oral q1800  . carvedilol  12.5 mg Oral BID  . escitalopram  10 mg Oral Daily  . gabapentin  300 mg Oral QHS  . insulin aspart  0-15 Units Subcutaneous TID WC  . insulin aspart  0-5 Units Subcutaneous QHS  . insulin NPH Human  20 Units Subcutaneous QAC breakfast  . ipratropium-albuterol  3 mL Nebulization Q6H  . levothyroxine  88 mcg Oral QAC breakfast  . sodium chloride flush  3 mL Intravenous Q12H  . Warfarin - Pharmacist Dosing Inpatient   Does not apply q1800   Continuous Infusions: . sodium chloride     PRN Meds: sodium chloride, acetaminophen, dicyclomine, diphenoxylate-atropine, fluticasone, ipratropium-albuterol, lidocaine, ondansetron (ZOFRAN) IV, oxymetazoline, sodium chloride, sodium chloride flush   Vital Signs    Vitals:   04/03/17 1939 04/04/17 0009 04/04/17 0242 04/04/17 0542  BP:    (!) 118/56  Pulse: 72 70 70 76  Resp: 19 (!) 21 18 (!) 21  Temp:    (!) 97.4 F (36.3 C)  TempSrc:    Oral  SpO2: 96% 93% 94% 93%  Weight:    242 lb 4.6 oz (109.9 kg)  Height:        Intake/Output Summary (Last 24 hours) at 04/04/2017 0900 Last data filed at 04/04/2017 0748 Gross per 24 hour  Intake 660 ml  Output 350 ml  Net 310 ml   Filed Weights   04/02/17 0342 04/03/17 0337 04/04/17 0542  Weight: 240 lb 8.4 oz (109.1 kg) 240 lb 8.4 oz (109.1 kg) 242 lb 4.6 oz (109.9 kg)    Telemetry    nsr with AV pacing - Personally Reviewed  ECG    none - Personally Reviewed  Physical Exam   GEN: No acute distress.   Neck: 6 cm JVD Cardiac: RRR, no murmurs, rubs, or gallops.  Respiratory: scattered basilar rales but decreased breath sounds GI: Soft, nontender,  non-distended obese MS: no lower extremity edema; No deformity., right hand is swollen and right arm is bruised Neuro:  Nonfocal  Psych: Normal affect   Labs    Chemistry Recent Labs  Lab 04/02/17 0610 04/03/17 0414 04/04/17 0329  NA 130* 128* 127*  K 5.1 5.1 5.1  CL 99* 97* 97*  CO2 22 22 22   GLUCOSE 205* 328* 394*  BUN 74* 85* 92*  CREATININE 3.65* 3.64* 3.52*  CALCIUM 8.3* 8.3* 8.1*  ALBUMIN  --   --  2.5*  GFRNONAA 11* 11* 12*  GFRAA 13* 13* 14*  ANIONGAP 9 9 8      Hematology Recent Labs  Lab 04/01/17 0508 04/02/17 0610 04/03/17 0414  WBC 8.4 2.7* 3.2*  RBC 3.89 3.62* 3.54*  HGB 11.6* 10.4* 10.2*  HCT 35.2* 32.6* 31.5*  MCV 90.5 90.1 89.0  MCH 29.8 28.7 28.8  MCHC 33.0 31.9 32.4  RDW 15.3 14.8 14.9  PLT 107* 85* 94*    Cardiac EnzymesNo results for input(s): TROPONINI in the last 168 hours. No results for input(s): TROPIPOC in the last 168 hours.   BNP Recent Labs  Lab 03/29/17 0947  BNP 1,158.1*     DDimer No results for input(s): DDIMER in the last  168 hours.   Radiology    Dg Chest 1 View  Result Date: 04/02/2017 CLINICAL DATA:  Shortness of breath, post thoracentesis EXAM: CHEST 1 VIEW COMPARISON:  04/01/2017 FINDINGS: Small right pleural effusion.  No pneumothorax is seen. Left lung base is obscured. Cardiomegaly.  Mild interstitial edema. Left subclavian ICD. Median sternotomy. IMPRESSION: No pneumothorax is seen status post thoracentesis. Cardiomegaly with mild interstitial edema and small right pleural effusion. Electronically Signed   By: Julian Hy M.D.   On: 04/02/2017 13:03   Ir Thoracentesis Asp Pleural Space W/img Guide  Result Date: 04/02/2017 INDICATION: Patient with right pleural effusion. Request is made for diagnostic and therapeutic right thoracentesis. EXAM: ULTRASOUND GUIDED DIAGNOSTIC AND THERAPEUTIC RIGHT THORACENTESIS MEDICATIONS: 10 mL 2% lidocaine COMPLICATIONS: None immediate. PROCEDURE: An ultrasound guided  thoracentesis was thoroughly discussed with the patient and questions answered. The benefits, risks, alternatives and complications were also discussed. The patient understands and wishes to proceed with the procedure. Written consent was obtained. Ultrasound was performed to localize and mark an adequate pocket of fluid in the right chest. The area was then prepped and draped in the normal sterile fashion. 2% Lidocaine was used for local anesthesia. Under ultrasound guidance a Safe-T-Centesis catheter was introduced. Thoracentesis was performed. The catheter was removed and a dressing applied. FINDINGS: A total of approximately 900 mL of hazy, yellow fluid was removed. Samples were sent to the laboratory as requested by the clinical team. IMPRESSION: Successful ultrasound guided diagnostic and therapeutic right thoracentesis yielding 900 mL of pleural fluid. Read by:  Brynda Greathouse PA-C Electronically Signed   By: Aletta Edouard M.D.   On: 04/02/2017 16:13    Cardiac Studies   none  Patient Profile     76 y.o. female admitted with respiratory failure  Assessment & Plan    1. Acute on chronic systolic/diastolic heart failure - she is tolerating her diuretic dose but no improvement in the weight. Note/agree with plans for metolazone in addition to demadex 2. Pleural effusion - s/p thoracentesis 3. COPD - she has been oxygen dependent for many years.  4. Stage 5 renal failure - her creatinine is stable despite escalating diuretic therapy. I will defer dosing to renal team. I suspect HD will ultimately be needed sooner than later.  For questions or updates, please contact [place] Please consult [email or web address] for contact info under Cardiology/STEMI.      Signed, Cristopher Peru, MD  04/04/2017, 9:00 AM  Patient ID: Daneil Dolin, female   DOB: 1940-05-31, 76 y.o.   MRN: [id number]

## 2017-04-05 ENCOUNTER — Encounter (HOSPITAL_COMMUNITY): Payer: Self-pay | Admitting: Diagnostic Radiology

## 2017-04-05 ENCOUNTER — Inpatient Hospital Stay (HOSPITAL_COMMUNITY): Payer: Medicare Other

## 2017-04-05 DIAGNOSIS — N179 Acute kidney failure, unspecified: Secondary | ICD-10-CM

## 2017-04-05 HISTORY — PX: IR FLUORO GUIDE CV LINE LEFT: IMG2282

## 2017-04-05 HISTORY — PX: IR US GUIDE VASC ACCESS LEFT: IMG2389

## 2017-04-05 LAB — RENAL FUNCTION PANEL
ANION GAP: 10 (ref 5–15)
Albumin: 2.8 g/dL — ABNORMAL LOW (ref 3.5–5.0)
BUN: 96 mg/dL — ABNORMAL HIGH (ref 6–20)
CALCIUM: 8.4 mg/dL — AB (ref 8.9–10.3)
CHLORIDE: 97 mmol/L — AB (ref 101–111)
CO2: 21 mmol/L — AB (ref 22–32)
Creatinine, Ser: 3.65 mg/dL — ABNORMAL HIGH (ref 0.44–1.00)
GFR calc non Af Amer: 11 mL/min — ABNORMAL LOW (ref >60)
GFR, EST AFRICAN AMERICAN: 13 mL/min — AB (ref >60)
GLUCOSE: 107 mg/dL — AB (ref 65–99)
POTASSIUM: 5.1 mmol/L (ref 3.5–5.1)
Phosphorus: 4.5 mg/dL (ref 2.5–4.6)
Sodium: 128 mmol/L — ABNORMAL LOW (ref 135–145)

## 2017-04-05 LAB — IRON AND TIBC
IRON: 23 ug/dL — AB (ref 28–170)
Saturation Ratios: 16 % (ref 10.4–31.8)
TIBC: 147 ug/dL — ABNORMAL LOW (ref 250–450)
UIBC: 124 ug/dL

## 2017-04-05 LAB — GLUCOSE, CAPILLARY
GLUCOSE-CAPILLARY: 137 mg/dL — AB (ref 65–99)
GLUCOSE-CAPILLARY: 175 mg/dL — AB (ref 65–99)
Glucose-Capillary: 123 mg/dL — ABNORMAL HIGH (ref 65–99)
Glucose-Capillary: 209 mg/dL — ABNORMAL HIGH (ref 65–99)

## 2017-04-05 LAB — TROPONIN I
TROPONIN I: 0.03 ng/mL — AB (ref <0.03)
TROPONIN I: 0.03 ng/mL — AB (ref <0.03)

## 2017-04-05 LAB — HEPATITIS B SURFACE ANTIGEN: HEP B S AG: NEGATIVE

## 2017-04-05 LAB — PROTIME-INR
INR: 2.68
Prothrombin Time: 28.3 seconds — ABNORMAL HIGH (ref 11.4–15.2)

## 2017-04-05 MED ORDER — GERHARDT'S BUTT CREAM
TOPICAL_CREAM | Freq: Three times a day (TID) | CUTANEOUS | Status: DC
  Administered 2017-04-05 – 2017-04-19 (×26): via TOPICAL
  Filled 2017-04-05: qty 1

## 2017-04-05 MED ORDER — HEPARIN SODIUM (PORCINE) 1000 UNIT/ML IJ SOLN
INTRAMUSCULAR | Status: AC
  Filled 2017-04-05: qty 1

## 2017-04-05 MED ORDER — DARBEPOETIN ALFA 60 MCG/0.3ML IJ SOSY
60.0000 ug | PREFILLED_SYRINGE | INTRAMUSCULAR | Status: DC
  Administered 2017-04-05: 60 ug via INTRAVENOUS
  Filled 2017-04-05 (×2): qty 0.3

## 2017-04-05 MED ORDER — LIDOCAINE HCL 1 % IJ SOLN
INTRAMUSCULAR | Status: AC | PRN
  Administered 2017-04-05: 10 mL

## 2017-04-05 MED ORDER — DARBEPOETIN ALFA 60 MCG/0.3ML IJ SOSY
PREFILLED_SYRINGE | INTRAMUSCULAR | Status: AC
  Administered 2017-04-05: 60 ug via INTRAVENOUS
  Filled 2017-04-05: qty 0.3

## 2017-04-05 MED ORDER — CARVEDILOL 6.25 MG PO TABS
6.2500 mg | ORAL_TABLET | Freq: Two times a day (BID) | ORAL | Status: DC
  Administered 2017-04-05: 6.25 mg via ORAL
  Filled 2017-04-05: qty 1

## 2017-04-05 MED ORDER — MIDODRINE HCL 5 MG PO TABS
10.0000 mg | ORAL_TABLET | ORAL | Status: DC | PRN
  Administered 2017-04-05 – 2017-04-17 (×7): 10 mg via ORAL
  Filled 2017-04-05 (×4): qty 2

## 2017-04-05 MED ORDER — PHYTONADIONE 5 MG PO TABS
5.0000 mg | ORAL_TABLET | Freq: Once | ORAL | Status: AC
  Administered 2017-04-05: 5 mg via ORAL
  Filled 2017-04-05: qty 1

## 2017-04-05 MED ORDER — LIDOCAINE HCL (PF) 2 % IJ SOLN: INTRAMUSCULAR | Status: AC | PRN

## 2017-04-05 MED ORDER — LIDOCAINE HCL 1 % IJ SOLN
INTRAMUSCULAR | Status: AC
  Filled 2017-04-05: qty 20

## 2017-04-05 MED ORDER — NEPRO/CARBSTEADY PO LIQD
237.0000 mL | Freq: Two times a day (BID) | ORAL | Status: DC
  Administered 2017-04-07 – 2017-04-11 (×3): 237 mL via ORAL

## 2017-04-05 MED ORDER — ACETAMINOPHEN 325 MG PO TABS
ORAL_TABLET | ORAL | Status: AC
  Filled 2017-04-05: qty 2

## 2017-04-05 NOTE — Procedures (Signed)
I have personally attended this patient's dialysis session.   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

## 2017-04-05 NOTE — Progress Notes (Signed)
RN asked for a second opinion regarding pt with increase anxiety and stress while on Bipap. CXR ordered. No other interventions from RRT. Advised RN to page MD with CXR results. No new orders at this time.

## 2017-04-05 NOTE — Progress Notes (Signed)
[facility] Rounding Note  Subjective/Interval History:  Poor diuretic response to 160 IV + metolazone and worsening resp distress Soft BP's Will need to proceed with dialysis INR too high for [facility] - but IR will place temp cath (can tunnel next week)  Objective Vital signs in last 24 hours: Vitals:   04/04/17 2026 04/05/17 0213 04/05/17 0438 04/05/17 0950  BP: 103/61  115/60   Pulse:      Resp:      Temp: 97.8 F (36.6 C)  98.3 F (36.8 C)   TempSrc: Axillary  Axillary   SpO2: 95% 96% 96% 96%  Weight:   110.1 kg (242 lb 11.6 oz)   Height:       Weight change: 0.2 kg (7.1 oz)  Intake/Output Summary (Last 24 hours) at 04/05/2017 1032 Last data filed at 04/05/2017 0620 Gross per 24 hour  Intake 132 ml  Output 225 ml  Net -93 ml   Physical Exam:  Blood pressure 115/60, pulse 72, temperature 98.3 F (36.8 C), temperature source Axillary, resp. rate (!) 22, height 5\' 4"  (1.626 m), weight 110.1 kg (242 lb 11.6 oz), SpO2 96 %.  Pale appearing WF, marked ^ WOB, getting nebs then back on BIPAP Lines/tubes: PIV only Skin: no rash, cyanosis. Pale, pasty L [place] ICD non inflamed non tender Neck: + JVD to jaw angle Breath sounds still diminished R chest, left no def crackles Exp wheezes S1S2 No S3 Abdomen obese, large pannus, no focal tenderness 1+ pitting posterior thigh edema, trace r>L pretibial edema all unchanged Long scar healed RLE (old V harvest site) with edema of scar  Recent Labs  Lab 03/30/17 0411 03/31/17 0338 04/01/17 0508 04/02/17 0610 04/03/17 0414 04/04/17 0329 04/05/17 0310  NA 133* 131* 132* 130* 128* 127* 128*  K 5.1 5.3* 4.9 5.1 5.1 5.1 5.1  CL 101 99* 101 99* 97* 97* 97*  CO2 23 23 20* 22 22 22  21*  GLUCOSE 118* 137* 113* 205* 328* 394* 107*  BUN 61* 66* 69* 74* 85* 92* 96*  CREATININE 3.01* 3.33* 3.60* 3.65* 3.64* 3.52* 3.65*  CALCIUM 8.5* 8.4* 8.6* 8.3* 8.3* 8.1* 8.4*  PHOS  --   --   --   --   --  4.5 4.5    Recent Labs  Lab 04/04/17 0329  04/05/17 0310  ALBUMIN 2.5* 2.8*    Recent Labs  Lab 03/31/17 0338 04/01/17 0508 04/02/17 0610 04/03/17 0414  WBC 6.4 8.4 2.7* 3.2*  HGB 10.5* 11.6* 10.4* 10.2*  HCT 33.0* 35.2* 32.6* 31.5*  MCV 91.2 90.5 90.1 89.0  PLT 104* 107* 85* 94*    Recent Labs  Lab 04/04/17 0745 04/04/17 1139 04/04/17 1614 04/04/17 2130 04/05/17 0807  GLUCAP 346* 285* 174* 113* 123*   Studies/Results: Dg Chest Port 1 View  Result Date: 04/05/2017 CLINICAL DATA:  Acute respiratory distress. EXAM: PORTABLE CHEST 1 VIEW COMPARISON:  04/02/2017 FINDINGS: Postoperative changes in the mediastinum. Cardiac pacemaker. Shallow inspiration. Cardiac enlargement. Diffuse pulmonary infiltrates, greater on the right. This likely represents edema although multifocal pneumonia could also have this appearance. Bilateral pleural effusions are probable. Progression since previous study. Calcified and tortuous aorta. Degenerative changes in the spine. Old right rib fractures. IMPRESSION: Cardiac enlargement with diffuse pulmonary infiltrates, likely edema, and bilateral pleural effusions with progression since previous study. Electronically Signed   By: Lucienne Capers M.D.   On: 04/05/2017 01:06   Medications: . sodium chloride    . furosemide Stopped (04/05/17 0703)   .  amiodarone  200 mg Oral Daily  . atorvastatin  10 mg Oral q1800  . carvedilol  12.5 mg Oral BID  . escitalopram  10 mg Oral Daily  . gabapentin  300 mg Oral QHS  . Gerhardt's butt cream   Topical TID  . insulin aspart  0-15 Units Subcutaneous TID WC  . insulin aspart  0-5 Units Subcutaneous QHS  . insulin NPH Human  20 Units Subcutaneous QAC breakfast  . ipratropium-albuterol  3 mL Nebulization Q6H  . levothyroxine  88 mcg Oral QAC breakfast  . loperamide  2 mg Oral Daily  . sodium chloride flush  3 mL Intravenous Q12H   Background: 76 y.o. year-old w/complicated PMH DM, MO, h/o non-Hodgkins's lymphoma, chronic systolic/diastolic HF with  Right HF, PAF (chronic anticoagulation), OHS/OSA on NIMV, pacer/ICD, CAD prior CABG, HLD, anemia on Aranesp. Known advanced CKD Stage 5 (GFR baseline 12-13 at last [facility] labs 02/19/18) followed by Dr. Marval Regal. Multiple prior AKI on CKD usually cardiorenal. Admitted 03/30/17 w/ progressive SOB, bilateral pleural effusions on CXR + R PNA. R thoracentesis of 900 ml on 11/19.  Creatinine on admission 3 (better than most recent baseline) up to 3.6 or so since 11/19, reported to be anuric though I/O records have been somewhat in doubt. Only modest doses IV lasix. Mild hyponatremia and K around 5.1. Weight up 3 kg since admission. We are asked to see.  1. CKD 5 w/volume overload and progressive dyspnea 1. Failed ceiling dose diuretics 2. This is her tipping point to ESRD and wishes to proceed with HD  3. INR too high for [facility] bu IR willing to place temp cath (tunnel next week) (will need to stay off coumadin for procedures) 4. HD daily after temp in 5. All discussed with pt and husband 6. Vein mapping ordered 7. VVS consult on Monday 8. CLIP for outpt 9. I think will be really tough for her with EF of 25-30% plus severe R HF 10. Will require midodrine for BP support with HD 11. Reduce carvedilol to 6.25 BID in hopes of better BP for UF  2. Systolic (EF 38-18) and diastolic HF with ECFV overload   1. CXR post thoracentesis improved aeration but still with interstitial edema.  2. Failure of diuretics. 3. See #1 above  3. Hypervolemic hyponatremia - should improve with HD  4. CKD-MBD - establish PTH status  5. Hyperkalemia - mild. K restricted renal diet. Dialysis  6. Acute on chronic hypoxic resp failure. Multifactorial.  1. Volume component as addressed above.  2. Some improvement post thoracentesis  7. CAP  - completed 4 days rocephin and azithro, CXR 11/20 no infiltrate  8. Anemia  - 2/2 CKD. s/p Aranesp 60 (gets Q4weeks at [facility])  on 03/27/17 and recent dose Feraheme.  1. Hb down some since  adm but has not dropped under 10 2. Start Aranesp with HD  9. Diabetes - per primary  10. Post prandial diarrhea - takes imodium at home. Ordered 11. H/o Non-Hodgkins lymphoma  Jamal Maes, MD [facility] [phone] pager 04/05/2017, 10:32 AM

## 2017-04-05 NOTE — Progress Notes (Signed)
Initial Nutrition Assessment  DOCUMENTATION CODES:   Morbid obesity  INTERVENTION:    Nepro Shake po BID, each supplement provides 425 kcal and 19 grams protein  NUTRITION DIAGNOSIS:   Inadequate oral intake related to poor appetite as evidenced by per patient/family report.  GOAL:   Patient will meet greater than or equal to 90% of their needs  MONITOR:   PO intake, Supplement acceptance, Skin  REASON FOR ASSESSMENT:   Malnutrition Screening Tool    ASSESSMENT:   76 yo female with PMH of carotid stenosis, LBBB, HLD, HTN, CHF, CAD, cardiomyopathy, retinopathy, DM-2, hypothyroidism, NHL, OSA, PAF, morbid obesity, pacemaker, vitamin D deficiency, CKD who was admitted on 11/16 with dyspnea, decreased UOP, CHF exacerbation.  Patient reports that she has been eating very poorly since admission. Husband reports fluid overload has impacted her appetite. She has been consuming < 50% of meals since admission. She drank an Ensure for lunch today, agreed to switch to Nepro shake. Labs and medications reviewed. Sodium 128 (L), potassium and phosphorus WNL Weight trending up since admission due to volume retention. Starting on HD today.  NUTRITION - FOCUSED PHYSICAL EXAM:    Most Recent Value  Orbital Region  No depletion  Upper Arm Region  No depletion  Thoracic and Lumbar Region  No depletion  Buccal Region  No depletion  Temple Region  No depletion  Clavicle Bone Region  No depletion  Clavicle and Acromion Bone Region  No depletion  Scapular Bone Region  Unable to assess  Dorsal Hand  No depletion  Patellar Region  No depletion  Anterior Thigh Region  No depletion  Posterior Calf Region  No depletion  Edema (RD Assessment)  Moderate  Hair  Reviewed  Eyes  Reviewed  Mouth  Reviewed  Skin  Reviewed  Nails  Reviewed       Diet Order:  Diet renal/carb modified with fluid restriction Diet-HS Snack? Nothing; Room service appropriate? Yes; Fluid consistency:  Thin  EDUCATION NEEDS:   No education needs have been identified at this time  Skin:  Skin Assessment: Skin Integrity Issues: Skin Integrity Issues:: Stage I Stage I: buttocks  Last BM:  11/22  Height:   Ht Readings from Last 1 Encounters:  03/29/17 5\' 4"  (1.626 m)    Weight:   Wt Readings from Last 1 Encounters:  04/05/17 242 lb 11.6 oz (110.1 kg)    Ideal Body Weight:  54.5 kg  BMI:  Body mass index is 41.66 kg/m.  Estimated Nutritional Needs:   Kcal:  2000-2200  Protein:  90-105 gm  Fluid:  1.2 L   Molli Barrows, RD, LDN, [place] Pager [phone] After Hours Pager [phone]

## 2017-04-05 NOTE — Plan of Care (Addendum)
Pt experiencing acute resp distress requiring increase O2 demand from chronic 3L. Order obtained to start pt on BiPAP. During shift pt became anxious and distressed while on BiPAP. Pt's breathing was labored, RRT suggested CXR. UOP about 163mL in foley after lasix infusion. MD made aware of findings and told to continue to monitor at this time, no new orders received and no change in pt's care plan. Pt stable while resting. O2 sats 96-98% on BiPAP.

## 2017-04-05 NOTE — Progress Notes (Signed)
RT placed patient on BIPAP. Patient is tolerating well at this time. RN is aware. RT will monitor as needed.

## 2017-04-05 NOTE — Progress Notes (Signed)
Patient arrived to unit by bed.  Reviewed treatment plan and this RN agrees with plan.  Report received from bedside RN, Gwen.  Consent obtained.  Patient A & O X 4.   Lung sounds diminished to ausculation in all fields. BLE 3+ pitting edema. Cardiac:  A/V paced.  Removed caps and cleansed LIJ catheter with chlorhedxidine.  Aspirated ports of heparin and flushed them with saline per protocol.  Connected and secured lines, initiated treatment at 1424.  UF Goal of 3500 mL and net fluid removal 3 L.  Will continue to monitor.

## 2017-04-05 NOTE — Progress Notes (Signed)
PROGRESS NOTE    Destiny [name]  YSA:[id number] DOB: 1941-04-11 DOA: 03/29/2017 PCP: Mosie Lukes, MD      Brief Narrative:  76 yo F CHF EF 20% with AICD, COPD on home oxygen of 2 L, CKD stage IV, anticoagulation with Coumadin with prior LV mural thrombus, and DM who presents with few days dyspnea.   Treated with 4 days ceftriaxone and azithromycin, no improvement.  Attempted diuresis but very little urine output, poorly quantified.  Urine foley placed and UOP low.  Progressively more dyspneic.     Assessment & Plan:  Principal Problem:   Acute on chronic combined systolic and diastolic CHF (congestive heart failure) (HCC) Active Problems:   Diabetes mellitus type 2 with retinopathy ([place])   Hyperlipidemia   Essential hypertension   Automatic implantable cardioverter-defibrillator in situ   Hypothyroidism   Anemia   Morbid obesity (HCC)   COPD, severe (HCC)   CKD (chronic kidney disease) stage 4, GFR 15-29 ml/min ([place])   Community acquired pneumonia   Acute hypoxemic respiratory failure (HCC)   Pressure injury of skin   Acute on chronic hypoxic respiratory failure from congestive heart failure, pleural effusion Worsening despite escalating Lasix.  Desats with exertion.  Poor UOP. -Will transfer to SDU status and BiPAP PRN -May continue IV Lasix, but will discuss placing tunneled cath with IR today -Strict I/O with foley -Daily BMP   CKD V not on HD Still only ~200 cc UOP per 24 hrs yesterday, despite Lasix 160 x3 and Zaroxolyn -D/w Neph, will place Tunneled cath and plan for HD when able -Continue BiPAP PRN -Reverse INR for procedure  Acute on chronic systolic CHF -As above -Continue aspirin, statin, BB  Hyponatremia Hypervolemic, stable. -Daily BMP  Suspected Community Acquired pneumonia Completed course of antibiotics.  Pleural fluid with elevated LDH, low protein.  Meets Light's criteria, but suspect this is not parapneumonic.  Chronic COPD -Stop  Steroids -Continue bronchodilators  Diabetes -Continue NPH, increase dose -Continue SSI  Atrial fibrillation CHADS2Vasc 4 at least.  On warfarin, amio. -Hold warfarin, given Vit K for tunneled Cath -Continue amiodarone, Coreg  Hypothyroidism -Continue levothyroxine  Other medications -Continue Bentyl -Continue Lexapro -Continue gabapentin     DVT prophylaxis: N/A Code Status: FULL Family Communication: Husband Disposition Plan: Carefully monitor UOP and continue diuresis.  Reverse INR, and place tunneled cath for HD   Consultants:   Nephrology, EP, IR  Procedures:   Thoracentesis  Antimicrobials:   Ceftraixone and azithromycin, completed 4 days    Subjective: Sleeping.  Dyspneic with any exertion.  Puffy and edematous.  No new fever, chills, cough, dysuria.    Objective: Vitals:   04/04/17 1900 04/04/17 2026 04/05/17 0213 04/05/17 0438  BP:  103/61  115/60  Pulse:      Resp:      Temp:  97.8 F (36.6 C)  98.3 F (36.8 C)  TempSrc:  Axillary  Axillary  SpO2: 99% 95% 96% 96%  Weight:    110.1 kg (242 lb 11.6 oz)  Height:        Intake/Output Summary (Last 24 hours) at 04/05/2017 0843 Last data filed at 04/05/2017 3235 Gross per 24 hour  Intake 372 ml  Output 225 ml  Net 147 ml   Filed Weights   04/03/17 0337 04/04/17 0542 04/05/17 0438  Weight: 109.1 kg (240 lb 8.4 oz) 109.9 kg (242 lb 4.6 oz) 110.1 kg (242 lb 11.6 oz)    Examination: General appearance: Elderly obese adult female,  sleeping, on BiPAP.   HEENT: Anicteric, conjunctiva pink, lids and lashes normal. No nasal deformity, discharge, epistaxis.  Lips moist.   Skin: Warm and dry.  No jaundice.  No suspicious rashes or lesions. Cardiac: RRR, nl S1-S2, no murmurs appreciated.  Capillary refill is brisk.  JVP not visible.  Trace LE edema.  Radia pulses 2+ and symmetric. Respiratory: BiPAP.  Diminished throughout, rales. Abdomen: Abdomen soft.  No TTP. No ascites, distension,  hepatosplenomegaly.   MSK: No deformities or effusions. Neuro: Occasional myoclonic jerks.  Sleeping. Psych: Unable to assess    Data Reviewed: I have personally reviewed following labs and imaging studies:  CBC: Recent Labs  Lab 03/29/17 0947 03/31/17 0338 04/01/17 0508 04/02/17 0610 04/03/17 0414  WBC 6.6 6.4 8.4 2.7* 3.2*  HGB 11.2* 10.5* 11.6* 10.4* 10.2*  HCT 35.2* 33.0* 35.2* 32.6* 31.5*  MCV 91.7 91.2 90.5 90.1 89.0  PLT 94* 104* 107* 85* 94*   Basic Metabolic Panel: Recent Labs  Lab 04/01/17 0508 04/02/17 0610 04/03/17 0414 04/04/17 0329 04/05/17 0310  NA 132* 130* 128* 127* 128*  K 4.9 5.1 5.1 5.1 5.1  CL 101 99* 97* 97* 97*  CO2 20* 22 22 22  21*  GLUCOSE 113* 205* 328* 394* 107*  BUN 69* 74* 85* 92* 96*  CREATININE 3.60* 3.65* 3.64* 3.52* 3.65*  CALCIUM 8.6* 8.3* 8.3* 8.1* 8.4*  PHOS  --   --   --  4.5 4.5   GFR: Estimated Creatinine Clearance: 16.4 mL/min (A) (by C-G formula based on SCr of 3.65 mg/dL (H)). Liver Function Tests: Recent Labs  Lab 04/04/17 0329 04/05/17 0310  ALBUMIN 2.5* 2.8*   No results for input(s): LIPASE, AMYLASE in the last 168 hours. No results for input(s): AMMONIA in the last 168 hours. Coagulation Profile: Recent Labs  Lab 04/01/17 1616 04/02/17 0610 04/03/17 0414 04/04/17 0329 04/05/17 0310  INR 3.53 3.86 3.80 3.52 2.68   Cardiac Enzymes: No results for input(s): CKTOTAL, CKMB, CKMBINDEX, TROPONINI in the last 168 hours. BNP (last 3 results) No results for input(s): PROBNP in the last 8760 hours. HbA1C: No results for input(s): HGBA1C in the last 72 hours. CBG: Recent Labs  Lab 04/04/17 0745 04/04/17 1139 04/04/17 1614 04/04/17 2130 04/05/17 0807  GLUCAP 346* 285* 174* 113* 123*   Lipid Profile: No results for input(s): CHOL, HDL, LDLCALC, TRIG, CHOLHDL, LDLDIRECT in the last 72 hours. Thyroid Function Tests: No results for input(s): TSH, T4TOTAL, FREET4, T3FREE, THYROIDAB in the last 72  hours. Anemia Panel: No results for input(s): VITAMINB12, FOLATE, FERRITIN, TIBC, IRON, RETICCTPCT in the last 72 hours. Urine analysis:    Component Value Date/Time   COLORURINE YELLOW 03/29/2017 2348   APPEARANCEUR HAZY (A) 03/29/2017 2348   LABSPEC 1.013 03/29/2017 2348   PHURINE 5.0 03/29/2017 2348   GLUCOSEU NEGATIVE 03/29/2017 2348   GLUCOSEU NEGATIVE 06/25/2016 1002   HGBUR NEGATIVE 03/29/2017 2348   HGBUR large 05/17/2008 1325   BILIRUBINUR NEGATIVE 03/29/2017 [place] 03/29/2017 2348   PROTEINUR NEGATIVE 03/29/2017 2348   UROBILINOGEN 0.2 06/25/2016 1002   NITRITE NEGATIVE 03/29/2017 2348   LEUKOCYTESUR LARGE (A) 03/29/2017 2348   Sepsis Labs: @LABRCNTIP (procalcitonin:4,lacticidven:4)  ) Recent Results (from the past 240 hour(s))  Culture, blood (routine x 2) Call MD if unable to obtain prior to antibiotics being given     Status: None   Collection Time: 03/29/17  3:45 PM  Result Value Ref Range Status   Specimen Description BLOOD LEFT ANTECUBITAL  Final   Special Requests   Final    BOTTLES DRAWN AEROBIC AND ANAEROBIC Blood Culture adequate volume   Culture NO GROWTH 5 DAYS  Final   Report Status 04/03/2017 FINAL  Final  Culture, blood (routine x 2) Call MD if unable to obtain prior to antibiotics being given     Status: None   Collection Time: 03/29/17  4:10 PM  Result Value Ref Range Status   Specimen Description BLOOD RIGHT ARM  Final   Special Requests   Final    BOTTLES DRAWN AEROBIC AND ANAEROBIC Blood Culture adequate volume   Culture NO GROWTH 5 DAYS  Final   Report Status 04/03/2017 FINAL  Final  MRSA PCR Screening     Status: None   Collection Time: 03/29/17  6:46 PM  Result Value Ref Range Status   MRSA by PCR NEGATIVE NEGATIVE Final    Comment:        The GeneXpert MRSA Assay (FDA approved for NASAL specimens only), is one component of a comprehensive MRSA colonization surveillance program. It is not intended to diagnose  MRSA infection nor to guide or monitor treatment for MRSA infections.   C difficile quick scan w PCR reflex     Status: None   Collection Time: 03/30/17 11:39 PM  Result Value Ref Range Status   C Diff antigen NEGATIVE NEGATIVE Final   C Diff toxin NEGATIVE NEGATIVE Final   C Diff interpretation No C. difficile detected.  Final  Giardia/Cryptosporidium EIA     Status: None   Collection Time: 03/30/17 11:39 PM  Result Value Ref Range Status   Giardia Ag, Stl Negative Negative Final   Cryptosporidium EIA Negative Negative Final    Comment: (NOTE) Performed At: [facility] [place], [place] [id number] Rush Farmer MD [id number]    Source of Sample STOOL  Final  Culture, body fluid-bottle     Status: None (Preliminary result)   Collection Time: 04/02/17 12:59 PM  Result Value Ref Range Status   Specimen Description FLUID RIGHT PLEURAL  Final   Special Requests NONE  Final   Culture NO GROWTH 2 DAYS  Final   Report Status PENDING  Incomplete  Gram stain     Status: None   Collection Time: 04/02/17 12:59 PM  Result Value Ref Range Status   Specimen Description FLUID RIGHT PLEURAL  Final   Special Requests NONE  Final   Gram Stain NO WBC SEEN NO ORGANISMS SEEN   Final   Report Status 04/02/2017 FINAL  Final         Radiology Studies: Dg Chest Port 1 View  Result Date: 04/05/2017 CLINICAL DATA:  Acute respiratory distress. EXAM: PORTABLE CHEST 1 VIEW COMPARISON:  04/02/2017 FINDINGS: Postoperative changes in the mediastinum. Cardiac pacemaker. Shallow inspiration. Cardiac enlargement. Diffuse pulmonary infiltrates, greater on the right. This likely represents edema although multifocal pneumonia could also have this appearance. Bilateral pleural effusions are probable. Progression since previous study. Calcified and tortuous aorta. Degenerative changes in the spine. Old right rib fractures. IMPRESSION: Cardiac enlargement with diffuse pulmonary  infiltrates, likely edema, and bilateral pleural effusions with progression since previous study. Electronically Signed   By: Lucienne Capers M.D.   On: 04/05/2017 01:06        Scheduled Meds: . amiodarone  200 mg Oral Daily  . atorvastatin  10 mg Oral q1800  . carvedilol  12.5 mg Oral BID  . escitalopram  10 mg Oral Daily  . gabapentin  300 mg Oral QHS  . insulin aspart  0-15 Units Subcutaneous TID WC  . insulin aspart  0-5 Units Subcutaneous QHS  . insulin NPH Human  20 Units Subcutaneous QAC breakfast  . ipratropium-albuterol  3 mL Nebulization Q6H  . levothyroxine  88 mcg Oral QAC breakfast  . loperamide  2 mg Oral Daily  . phytonadione  5 mg Oral Once  . sodium chloride flush  3 mL Intravenous Q12H   Continuous Infusions: . sodium chloride    . furosemide Stopped (04/05/17 0703)     LOS: 7 days    Time spent: 30 minutes    Edwin Dada, MD [facility] Pager [phone]  If 7PM-7AM, please contact night-coverage [email or web address] Password [facility] 04/05/2017, 8:43 AM

## 2017-04-05 NOTE — Procedures (Signed)
Placement of left jugular Temp Cath. Tip at SVC/RA junction.  Minimal blood loss and no immediate complication.  Catheter is ready to use.

## 2017-04-05 NOTE — Progress Notes (Signed)
Dialysis treatment completed.  3500 mL ultrafiltrated.  3000 mL net fluid removal.  Patient status unchanged. Lung sounds diminished to ausculation in all fields. BLE 3+ pitting edema. Cardiac: A/V paced.  Cleansed LIJ catheter with chlorhexidine.  Disconnected lines and flushed ports with saline per protocol.  Ports locked with heparin and capped per protocol.    Report given to bedside, RN Gwen.

## 2017-04-06 ENCOUNTER — Other Ambulatory Visit: Payer: Self-pay

## 2017-04-06 LAB — PROTIME-INR
INR: 1.54
PROTHROMBIN TIME: 18.3 s — AB (ref 11.4–15.2)

## 2017-04-06 LAB — RENAL FUNCTION PANEL
Albumin: 2.3 g/dL — ABNORMAL LOW (ref 3.5–5.0)
Anion gap: 7 (ref 5–15)
BUN: 70 mg/dL — AB (ref 6–20)
CHLORIDE: 99 mmol/L — AB (ref 101–111)
CO2: 24 mmol/L (ref 22–32)
CREATININE: 2.71 mg/dL — AB (ref 0.44–1.00)
Calcium: 7.8 mg/dL — ABNORMAL LOW (ref 8.9–10.3)
GFR calc Af Amer: 19 mL/min — ABNORMAL LOW (ref >60)
GFR calc non Af Amer: 16 mL/min — ABNORMAL LOW (ref >60)
GLUCOSE: 173 mg/dL — AB (ref 65–99)
Phosphorus: 4.2 mg/dL (ref 2.5–4.6)
Potassium: 4.1 mmol/L (ref 3.5–5.1)
Sodium: 130 mmol/L — ABNORMAL LOW (ref 135–145)

## 2017-04-06 LAB — GLUCOSE, CAPILLARY
GLUCOSE-CAPILLARY: 128 mg/dL — AB (ref 65–99)
GLUCOSE-CAPILLARY: 91 mg/dL (ref 65–99)
Glucose-Capillary: 156 mg/dL — ABNORMAL HIGH (ref 65–99)
Glucose-Capillary: 115 mg/dL — ABNORMAL HIGH (ref 65–99)

## 2017-04-06 LAB — HEPARIN LEVEL (UNFRACTIONATED): Heparin Unfractionated: <0.1 IU/mL — ABNORMAL LOW (ref 0.30–0.70)

## 2017-04-06 LAB — TROPONIN I: TROPONIN I: 0.04 ng/mL — AB (ref <0.03)

## 2017-04-06 LAB — HEPATITIS B SURFACE ANTIBODY,QUALITATIVE: HEP B S AB: NONREACTIVE

## 2017-04-06 LAB — HEPATITIS B CORE ANTIBODY, TOTAL: HEP B C TOTAL AB: NEGATIVE

## 2017-04-06 MED ORDER — ACETAMINOPHEN 325 MG PO TABS
ORAL_TABLET | ORAL | Status: AC
  Filled 2017-04-06: qty 2

## 2017-04-06 MED ORDER — LORAZEPAM 2 MG/ML IJ SOLN
0.5000 mg | Freq: Four times a day (QID) | INTRAMUSCULAR | Status: DC | PRN
  Administered 2017-04-06 – 2017-04-11 (×2): 1 mg via INTRAVENOUS
  Administered 2017-04-12: 0.5 mg via INTRAVENOUS
  Filled 2017-04-06 (×3): qty 1

## 2017-04-06 MED ORDER — SODIUM CHLORIDE 0.9 % IV SOLN
125.0000 mg | Freq: Every day | INTRAVENOUS | Status: AC
  Administered 2017-04-06 – 2017-04-11 (×6): 125 mg via INTRAVENOUS
  Filled 2017-04-06 (×6): qty 10

## 2017-04-06 MED ORDER — MIDODRINE HCL 5 MG PO TABS
ORAL_TABLET | ORAL | Status: AC
  Filled 2017-04-06: qty 2

## 2017-04-06 MED ORDER — CARVEDILOL 6.25 MG PO TABS
6.2500 mg | ORAL_TABLET | Freq: Two times a day (BID) | ORAL | Status: DC
  Administered 2017-04-06 – 2017-04-19 (×19): 6.25 mg via ORAL
  Filled 2017-04-06 (×20): qty 1

## 2017-04-06 MED ORDER — CARVEDILOL 3.125 MG PO TABS
3.1250 mg | ORAL_TABLET | Freq: Two times a day (BID) | ORAL | Status: DC
  Filled 2017-04-06: qty 1

## 2017-04-06 MED ORDER — INSULIN NPH (HUMAN) (ISOPHANE) 100 UNIT/ML ~~LOC~~ SUSP
15.0000 [IU] | Freq: Every day | SUBCUTANEOUS | Status: DC
  Administered 2017-04-06 – 2017-04-07 (×2): 15 [IU] via SUBCUTANEOUS
  Filled 2017-04-06: qty 10

## 2017-04-06 MED ORDER — HEPARIN (PORCINE) IN NACL 100-0.45 UNIT/ML-% IJ SOLN
1750.0000 [IU]/h | INTRAMUSCULAR | Status: DC
  Administered 2017-04-06: 1200 [IU]/h via INTRAVENOUS
  Administered 2017-04-06: 1500 [IU]/h via INTRAVENOUS
  Filled 2017-04-06 (×2): qty 250

## 2017-04-06 MED ORDER — HEPARIN BOLUS VIA INFUSION
4000.0000 [IU] | Freq: Once | INTRAVENOUS | Status: AC
  Administered 2017-04-06: 4000 [IU] via INTRAVENOUS
  Filled 2017-04-06: qty 4000

## 2017-04-06 MED ORDER — HYDROMORPHONE HCL 1 MG/ML IJ SOLN
0.5000 mg | Freq: Once | INTRAMUSCULAR | Status: AC
  Administered 2017-04-06: 0.5 mg via INTRAVENOUS
  Filled 2017-04-06: qty 0.5

## 2017-04-06 MED ORDER — GABAPENTIN 100 MG PO CAPS
100.0000 mg | ORAL_CAPSULE | Freq: Every day | ORAL | Status: DC
  Administered 2017-04-06 – 2017-04-10 (×5): 100 mg via ORAL
  Filled 2017-04-06 (×5): qty 1

## 2017-04-06 NOTE — Plan of Care (Signed)
Patient extremely exhausted and restless from HD and procedures the past two days. Gave medications and got patient ready for bed in a timely matter. BiPap on and patient sleeping for the night.

## 2017-04-06 NOTE — Progress Notes (Signed)
Progress Note  Patient Name: Destiny [name] Date of Encounter: 04/06/2017  Primary Cardiologist: Dr. Reine Just  Patient Profile     76 y.o. 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 amiodarone  200 mg Oral Daily  . atorvastatin  10 mg Oral q1800  . carvedilol  3.125 mg Oral BID WC  . darbepoetin (ARANESP) injection - DIALYSIS  60 mcg Intravenous Q Fri-HD  . escitalopram  10 mg Oral Daily  . feeding supplement (NEPRO CARB STEADY)  237 mL Oral BID BM  . gabapentin  100 mg Oral QHS  . Gerhardt's butt cream   Topical TID  . insulin aspart  0-15 Units Subcutaneous TID WC  . insulin aspart  0-5 Units Subcutaneous QHS  . insulin NPH Human  15 Units Subcutaneous QAC breakfast  . ipratropium-albuterol  3 mL Nebulization Q6H  . levothyroxine  88 mcg Oral QAC breakfast  . loperamide  2 mg Oral Daily  . sodium chloride flush  3 mL Intravenous Q12H   Continuous Infusions: . sodium chloride     PRN Meds: sodium chloride, acetaminophen, dicyclomine, diphenoxylate-atropine, fluticasone, ipratropium-albuterol, midodrine, ondansetron (ZOFRAN) IV, oxymetazoline, sodium chloride, sodium chloride flush   Vital Signs    Vitals:   04/06/17 0019 04/06/17 0225 04/06/17 0624 04/06/17 0625  BP: (!) 107/53  112/63 112/63  Pulse: 70   73  Resp: 16   14  Temp:    98 F (36.7 C)  TempSrc:    Axillary  SpO2: 95% 97%  99%  Weight:      Height:        Intake/Output Summary (Last 24 hours) at 04/06/2017 0804 Last data filed at 04/06/2017 6761 Gross per 24 hour  Intake 142 ml  Output 3850 ml  Net  -3708 ml   Filed Weights   04/05/17 0438 04/05/17 1417 04/05/17 1724  Weight: 242 lb 11.6 oz (110.1 kg) 242 lb 11.6 oz (110.1 kg) 236 lb 1.8 oz (107.1 kg)    Telemetry    V pacing  Can not discern A capture - Personally Reviewed  ECG    From 11/19 can non discern A capture  - Personally Reviewed  Physical Exam   Well developed and Morbidly obese with O2 in place Neck supple t Clear Regular rate and rhythm, no murmurs or gallops Abd-soft with active BS No Clubbing cyanosis 1+edema Skin-warm and dry A & Oriented  Grossly normal sensory and motor function    Labs    Chemistry Recent Labs  Lab 04/04/17 0329 04/05/17 0310 04/06/17 0419  NA 127* 128* 130*  K 5.1 5.1 4.1  CL 97* 97* 99*  CO2 22 21* 24  GLUCOSE 394* 107* 173*  BUN 92* 96* 70*  CREATININE 3.52* 3.65* 2.71*  CALCIUM 8.1* 8.4* 7.8*  ALBUMIN 2.5* 2.8* 2.3*  GFRNONAA 12* 11* 16*  GFRAA 14* 13* 19*  ANIONGAP 8 10 7      Hematology Recent Labs  Lab 04/01/17 0508 04/02/17 0610 04/03/17 0414  WBC 8.4 2.7* 3.2*  RBC 3.89 3.62* 3.54*  HGB 11.6* 10.4* 10.2*  HCT 35.2* 32.6* 31.5*  MCV 90.5 90.1 89.0  MCH 29.8 28.7 28.8  MCHC 33.0 31.9 32.4  RDW 15.3 14.8 14.9  PLT 107* 85* 94*    Cardiac Enzymes Recent Labs  Lab 04/05/17 1313 04/05/17 1435 04/05/17 2301  TROPONINI 0.03* 0.03* 0.04*   No results for input(s): TROPIPOC in the last 168 hours.   BNP No results for input(s): BNP, PROBNP in the last 168 hours.   DDimer No results for input(s): DDIMER in the last 168 hours.   Radiology    Ir Fluoro Guide Cv Line Left  Result Date: 04/05/2017 INDICATION: Chronic kidney disease and fluid overload. Patient needs urgent dialysis. Plan for non tunneled catheter followed by a tunneled catheter when the patient's INR level has normalized. EXAM: FLUOROSCOPIC AND ULTRASOUND GUIDED PLACEMENT OF A NON-TUNNELED DIALYSIS CATHETER Physician: Stephan Minister. Henn, MD MEDICATIONS: None ANESTHESIA/SEDATION: None  FLUOROSCOPY TIME:  Fluoroscopy Time: 24 seconds, 5 mGy COMPLICATIONS: None immediate. PROCEDURE: Informed consent was obtained for catheter placement. The patient was placed supine on the interventional table. Ultrasound confirmed a patent left internal jugular vein. Ultrasound images were obtained for documentation. The left side of the neck was prepped and draped in a sterile fashion. The left side of the neck was anesthetized with 1% lidocaine. Maximal barrier sterile technique was utilized including caps, mask, sterile gowns, sterile gloves, sterile drape, hand hygiene and skin antiseptic. A small incision was made with #11 blade scalpel. A 21 gauge needle directed into the left internal jugular vein with ultrasound guidance. A micropuncture dilator set was placed. A 20 cm Mahurkar catheter was selected. The catheter was advanced over a wire and positioned at the superior cavoatrial junction. Fluoroscopic images were obtained for documentation. Both dialysis lumens were found to aspirate and flush well. The proper amount of heparin was flushed in both lumens. The central venous lumen was flushed with normal saline. Catheter was sutured to skin. FINDINGS: Catheter tip at the superior cavoatrial junction. IMPRESSION: Successful placement of a left jugular non-tunneled dialysis catheter using ultrasound and fluoroscopic guidance. Electronically Signed   By: Markus Daft M.D.   On: 04/05/2017 15:04   Ir US Guide Vasc Access Left  Result Date: 04/05/2017 INDICATION: Chronic kidney disease and fluid overload. Patient needs urgent dialysis. Plan for non tunneled catheter followed by a tunneled catheter when the patient's INR level has normalized. EXAM: FLUOROSCOPIC AND ULTRASOUND GUIDED PLACEMENT OF A NON-TUNNELED DIALYSIS CATHETER Physician: Stephan Minister. Henn, MD MEDICATIONS: None ANESTHESIA/SEDATION: None FLUOROSCOPY TIME:  Fluoroscopy Time: 24 seconds, 5 mGy COMPLICATIONS: None immediate. PROCEDURE: Informed consent  was obtained for catheter placement. The patient was placed supine on the interventional table. Ultrasound confirmed a patent left internal jugular vein. Ultrasound images were obtained for documentation. The left side of the neck was prepped and draped in a sterile fashion. The left side of the neck was anesthetized with 1% lidocaine. Maximal barrier sterile technique was utilized including caps, mask, sterile gowns, sterile gloves, sterile drape, hand hygiene and skin antiseptic. A small incision was made with #11 blade scalpel. A 21 gauge needle directed into the left internal jugular vein with ultrasound guidance. A micropuncture dilator set was placed. A 20 cm Mahurkar catheter was selected. The catheter was advanced over a wire and positioned at the superior cavoatrial junction. Fluoroscopic images were obtained for documentation. Both dialysis lumens were found to aspirate and flush well. The proper amount of heparin was flushed in both lumens. The central  venous lumen was flushed with normal saline. Catheter was sutured to skin. FINDINGS: Catheter tip at the superior cavoatrial junction. IMPRESSION: Successful placement of a left jugular non-tunneled dialysis catheter using ultrasound and fluoroscopic guidance. Electronically Signed   By: Markus Daft M.D.   On: 04/05/2017 15:04   Dg Chest Port 1 View  Result Date: 04/05/2017 CLINICAL DATA:  Acute respiratory distress. EXAM: PORTABLE CHEST 1 VIEW COMPARISON:  04/02/2017 FINDINGS: Postoperative changes in the mediastinum. Cardiac pacemaker. Shallow inspiration. Cardiac enlargement. Diffuse pulmonary infiltrates, greater on the right. This likely represents edema although multifocal pneumonia could also have this appearance. Bilateral pleural effusions are probable. Progression since previous study. Calcified and tortuous aorta. Degenerative changes in the spine. Old right rib fractures. IMPRESSION: Cardiac enlargement with diffuse pulmonary infiltrates,  likely edema, and bilateral pleural effusions with progression since previous study. Electronically Signed   By: Lucienne Capers M.D.   On: 04/05/2017 01:06    Cardiac Studies   none    Assessment & Plan    Congestive heart failure  A/C  Ischemic and nonischemic Cardiomyopathy  Biventricular ( RV was normal 4/15)   Aortic stenosis ( mild but artificially low 2/2 LV dysfunction ??)  Renal failure A/C  Morbidly obese  Atrial fibrillation paroxysmal   CRT at ERI  Volume status is improved Will get 12 lead as cannot discern atrial capture  ie is she in AFib May need DB input re low gradient AS  Without symptoms of ischemia      Signed, Virl Axe, MD  04/06/2017, 8:04 AM  Patient ID: Daneil Dolin, female   DOB: 26-Dec-1940, 76 y.o.   MRN: [id number]

## 2017-04-06 NOTE — Procedures (Signed)
I have personally attended this patient's dialysis session.   HD #2 for new ESRD UF goal 2-3 liters BP holding up so far  Jamal Maes, MD [facility] [phone] Pager 04/06/2017, 10:07 AM

## 2017-04-06 NOTE — Progress Notes (Signed)
ANTICOAGULATION CONSULT NOTE - Initial Consult  Pharmacy Consult for Heparin Indication: atrial fibrillation   Allergies  Allergen Reactions  . Sulfonamide Derivatives Other (See Comments)    Reaction unknown; "childhood allergy/mother"    Patient Measurements: Height: 5\' 4"  (162.6 cm) Weight: 236 lb 1.8 oz (107.1 kg) IBW/kg (Calculated) : 54.7  Heparin Dosing weight: 80 kg  Vital Signs: Temp: 98 F (36.7 C) (11/24 0625) Temp Source: Axillary (11/24 0625) BP: 112/63 (11/24 0625) Pulse Rate: 73 (11/24 0625)  Labs: Recent Labs    04/04/17 0329 04/05/17 0310 04/05/17 1313 04/05/17 1435 04/05/17 2301 04/06/17 0419  LABPROT 35.0* 28.3*  --   --   --  18.3*  INR 3.52 2.68  --   --   --  1.54  CREATININE 3.52* 3.65*  --   --   --  2.71*  TROPONINI  --   --  0.03* 0.03* 0.04*  --     Estimated Creatinine Clearance: 21.8 mL/min (A) (by C-G formula based on SCr of 2.71 mg/dL (H)).    Assessment: 76 year old female admitted with shortness of breath. She is on chronic anticoagulation with Coumadin for atrial fibrillation and history of a mural thrombus. Coumadin was being held since INR has been supratherapeutic and Vit K 5 mg given in anticipation for right tunneled cath procedure. Pharmacy has been consulted to dose heparin while coumadin is off and as a bridge back to coumadin.   INR 1.54 this AM  Goal of Therapy:  Heparin level 0.3-0.7 Monitor platelets by anticoagulation protocol: Yes   Plan:  Heparin 4000 units IV bolus x 1 Heparin 1200 units/hr HL in 8 hours, then daily Daily CBC  Leroy Libman, PharmD Pharmacy Resident Pager: [phone]

## 2017-04-06 NOTE — Progress Notes (Signed)
[facility] for Heparin Indication: atrial fibrillation   Allergies  Allergen Reactions  . Sulfonamide Derivatives Other (See Comments)    Reaction unknown; "childhood allergy/mother"    Patient Measurements: Height: 5\' 4"  (162.6 cm) Weight: 231 lb 7.7 oz (105 kg) IBW/kg (Calculated) : 54.7  Heparin Dosing weight: 80 kg  Vital Signs: Temp: 98.2 F (36.8 C) (11/24 1232) Temp Source: Oral (11/24 1232) BP: 120/70 (11/24 1232) Pulse Rate: 74 (11/24 1232)  Labs: Recent Labs    04/04/17 0329 04/05/17 0310 04/05/17 1313 04/05/17 1435 04/05/17 2301 04/06/17 0419 04/06/17 1542  LABPROT 35.0* 28.3*  --   --   --  18.3*  --   INR 3.52 2.68  --   --   --  1.54  --   HEPARINUNFRC  --   --   --   --   --   --  <0.10*  CREATININE 3.52* 3.65*  --   --   --  2.71*  --   TROPONINI  --   --  0.03* 0.03* 0.04*  --   --     Estimated Creatinine Clearance: 21.5 mL/min (A) (by C-G formula based on SCr of 2.71 mg/dL (H)).    Assessment: 76 year old female admitted with shortness of breath. She is on chronic anticoagulation with Coumadin for atrial fibrillation and history of a mural thrombus. Coumadin was being held since INR has been supratherapeutic and Vit K 5 mg given in anticipation for right tunneled cath procedure. Pharmacy has been consulted to dose heparin while coumadin is off and as a bridge back to coumadin.   INR 1.54 this AM Initial heparin level < 0.10  Goal of Therapy:  Heparin level 0.3-0.7 Monitor platelets by anticoagulation protocol: Yes   Plan:  Heparin to 1500 units / hr Next level in Am  Thank you Anette Guarneri, PharmD [phone]

## 2017-04-06 NOTE — Plan of Care (Signed)
Respiratory rate and effort improved with fluid removal post HD

## 2017-04-06 NOTE — Progress Notes (Addendum)
[facility] Rounding Note  Subjective/Interval History:  IR placed L IJ temp cath yesterday 1st HD well tolerated and 3 liters of fluid off Getting 2nd dialysis today for another 2-3 liters Says less dyspneic but visibly SOB on HD  Objective Vital signs in last 24 hours: Vitals:   04/06/17 0019 04/06/17 0225 04/06/17 0624 04/06/17 0625  BP: (!) 107/53  112/63 112/63  Pulse: 70   73  Resp: 16   14  Temp:    98 F (36.7 C)  TempSrc:    Axillary  SpO2: 95% 97%  99%  Weight:      Height:       Weight change: 0 kg (0 lb)  Intake/Output Summary (Last 24 hours) at 04/06/2017 0951 Last data filed at 04/06/2017 0622 Gross per 24 hour  Intake 142 ml  Output 3850 ml  Net -3708 ml   Physical Exam:  Blood pressure 112/63, pulse 73, temperature 98 F (36.7 C), temperature source Axillary, resp. rate 14, height 5\' 4"  (1.626 m), weight 107.1 kg (236 lb 1.8 oz), SpO2 99 %.  Pale appearing WF, WOB less but still appears dyspneic Seen on hD L IJ temp cath in use for HD L IJ temp HD cath (IR 11/23) L  ICD non inflamed non tender Neck: + JVD to jaw angle Anteriorly a little clearer S1S2 No S3 Abdomen obese, large pannus, no focal tenderness 1+ pitting posterior thigh edema, trace r>L pretibial edema all unchanged Long scar healed RLE (old V harvest site) with edema of scar  Recent Labs  Lab 03/31/17 0338 04/01/17 0508 04/02/17 0610 04/03/17 0414 04/04/17 0329 04/05/17 0310 04/06/17 0419  NA 131* 132* 130* 128* 127* 128* 130*  K 5.3* 4.9 5.1 5.1 5.1 5.1 4.1  CL 99* 101 99* 97* 97* 97* 99*  CO2 23 20* 22 22 22  21* 24  GLUCOSE 137* 113* 205* 328* 394* 107* 173*  BUN 66* 69* 74* 85* 92* 96* 70*  CREATININE 3.33* 3.60* 3.65* 3.64* 3.52* 3.65* 2.71*  CALCIUM 8.4* 8.6* 8.3* 8.3* 8.1* 8.4* 7.8*  PHOS  --   --   --   --  4.5 4.5 4.2    Recent Labs  Lab 04/04/17 0329 04/05/17 0310 04/06/17 0419  ALBUMIN 2.5* 2.8* 2.3*    Recent Labs  Lab 03/31/17 0338 04/01/17 0508  04/02/17 0610 04/03/17 0414  WBC 6.4 8.4 2.7* 3.2*  HGB 10.5* 11.6* 10.4* 10.2*  HCT 33.0* 35.2* 32.6* 31.5*  MCV 91.2 90.5 90.1 89.0  PLT 104* 107* 85* 94*   Iron/TIBC/Ferritin/ %Sat    Component Value Date/Time   IRON 23 (L) 04/05/2017 1718   TIBC 147 (L) 04/05/2017 1718   FERRITIN 225.2 12/18/2011 0750   IRONPCTSAT 16 04/05/2017 1718    Recent Labs  Lab 04/05/17 0807 04/05/17 1134 04/05/17 1839 04/05/17 2251 04/06/17 0729  GLUCAP 123* 137* 175* 209* 156*   Studies/Results: Ir Fluoro Guide Cv Line Left  Result Date: 04/05/2017 INDICATION: Chronic kidney disease and fluid overload. Patient needs urgent dialysis. Plan for non tunneled catheter followed by a tunneled catheter when the patient's INR level has normalized. EXAM: FLUOROSCOPIC AND ULTRASOUND GUIDED PLACEMENT OF A NON-TUNNELED DIALYSIS CATHETER Physician: Stephan Minister. Henn, MD MEDICATIONS: None ANESTHESIA/SEDATION: None FLUOROSCOPY TIME:  Fluoroscopy Time: 24 seconds, 5 mGy COMPLICATIONS: None immediate. PROCEDURE: Informed consent was obtained for catheter placement. The patient was placed supine on the interventional table. Ultrasound confirmed a patent left internal jugular vein. Ultrasound images were obtained for documentation.  The left side of the neck was prepped and draped in a sterile fashion. The left side of the neck was anesthetized with 1% lidocaine. Maximal barrier sterile technique was utilized including caps, mask, sterile gowns, sterile gloves, sterile drape, hand hygiene and skin antiseptic. A small incision was made with #11 blade scalpel. A 21 gauge needle directed into the left internal jugular vein with ultrasound guidance. A micropuncture dilator set was placed. A 20 cm Mahurkar catheter was selected. The catheter was advanced over a wire and positioned at the superior cavoatrial junction. Fluoroscopic images were obtained for documentation. Both dialysis lumens were found to aspirate and flush well. The  proper amount of heparin was flushed in both lumens. The central venous lumen was flushed with normal saline. Catheter was sutured to skin. FINDINGS: Catheter tip at the superior cavoatrial junction. IMPRESSION: Successful placement of a left jugular non-tunneled dialysis catheter using ultrasound and fluoroscopic guidance. Electronically Signed   By: Markus Daft M.D.   On: 04/05/2017 15:04   Ir US Guide Vasc Access Left  Result Date: 04/05/2017 INDICATION: Chronic kidney disease and fluid overload. Patient needs urgent dialysis. Plan for non tunneled catheter followed by a tunneled catheter when the patient's INR level has normalized. EXAM: FLUOROSCOPIC AND ULTRASOUND GUIDED PLACEMENT OF A NON-TUNNELED DIALYSIS CATHETER Physician: Stephan Minister. Henn, MD MEDICATIONS: None ANESTHESIA/SEDATION: None FLUOROSCOPY TIME:  Fluoroscopy Time: 24 seconds, 5 mGy COMPLICATIONS: None immediate. PROCEDURE: Informed consent was obtained for catheter placement. The patient was placed supine on the interventional table. Ultrasound confirmed a patent left internal jugular vein. Ultrasound images were obtained for documentation. The left side of the neck was prepped and draped in a sterile fashion. The left side of the neck was anesthetized with 1% lidocaine. Maximal barrier sterile technique was utilized including caps, mask, sterile gowns, sterile gloves, sterile drape, hand hygiene and skin antiseptic. A small incision was made with #11 blade scalpel. A 21 gauge needle directed into the left internal jugular vein with ultrasound guidance. A micropuncture dilator set was placed. A 20 cm Mahurkar catheter was selected. The catheter was advanced over a wire and positioned at the superior cavoatrial junction. Fluoroscopic images were obtained for documentation. Both dialysis lumens were found to aspirate and flush well. The proper amount of heparin was flushed in both lumens. The central venous lumen was flushed with normal saline.  Catheter was sutured to skin. FINDINGS: Catheter tip at the superior cavoatrial junction. IMPRESSION: Successful placement of a left jugular non-tunneled dialysis catheter using ultrasound and fluoroscopic guidance. Electronically Signed   By: Markus Daft M.D.   On: 04/05/2017 15:04   Dg Chest Port 1 View  Result Date: 04/05/2017 CLINICAL DATA:  Acute respiratory distress. EXAM: PORTABLE CHEST 1 VIEW COMPARISON:  04/02/2017 FINDINGS: Postoperative changes in the mediastinum. Cardiac pacemaker. Shallow inspiration. Cardiac enlargement. Diffuse pulmonary infiltrates, greater on the right. This likely represents edema although multifocal pneumonia could also have this appearance. Bilateral pleural effusions are probable. Progression since previous study. Calcified and tortuous aorta. Degenerative changes in the spine. Old right rib fractures. IMPRESSION: Cardiac enlargement with diffuse pulmonary infiltrates, likely edema, and bilateral pleural effusions with progression since previous study. Electronically Signed   By: Lucienne Capers M.D.   On: 04/05/2017 01:06   Medications: . sodium chloride    . heparin 1,200 Units/hr (04/06/17 0837)   . acetaminophen      . amiodarone  200 mg Oral Daily  . atorvastatin  10 mg Oral q1800  .  carvedilol  3.125 mg Oral BID WC  . darbepoetin (ARANESP) injection - DIALYSIS  60 mcg Intravenous Q Fri-HD  . escitalopram  10 mg Oral Daily  . feeding supplement (NEPRO CARB STEADY)  237 mL Oral BID BM  . gabapentin  100 mg Oral QHS  . Gerhardt's butt cream   Topical TID  . insulin aspart  0-15 Units Subcutaneous TID WC  . insulin aspart  0-5 Units Subcutaneous QHS  . insulin NPH Human  15 Units Subcutaneous QAC breakfast  . ipratropium-albuterol  3 mL Nebulization Q6H  . levothyroxine  88 mcg Oral QAC breakfast  . loperamide  2 mg Oral Daily  . midodrine      . sodium chloride flush  3 mL Intravenous Q12H   Background: 76 y.o. year-old w/complicated PMH DM, [place],  h/o non-Hodgkins's lymphoma, chronic systolic/diastolic HF with Right HF, PAF (chronic anticoagulation), OHS/OSA on NIMV, pacer/ICD, CAD prior CABG, HLD, anemia on Aranesp. Known advanced CKD Stage 5 (GFR baseline 12-13 at last [facility] labs 02/19/18) followed by Dr. Marval Regal. Multiple prior AKI on CKD usually cardiorenal. Admitted 03/30/17 w/ progressive SOB, bilateral pleural effusions on CXR + R PNA. R thoracentesis of 900 ml on 11/19.  Creatinine on admission 3 (better than most recent baseline) up to 3.6 or so since 11/19, reported to be anuric though I/O records have been somewhat in doubt. Only modest doses IV lasix. Mild hyponatremia and K around 5.1. Weight up 3 kg since admission. We are asked to see.  1. CKD 5 w/volume overload and progressive dyspnea 1. Failed ceiling dose diuretics =  her tipping point to ESRD and wishes to proceed with HD  2. INR too high for TDC so IR placed L IJ temp cath 3. HD #1 yesterday, 3L off - better tolerated than I thought it would be 4. Vein map 5. Consult [facility] on Monday 6. CLIP for outpt 7. Supporting BP with midodrine pre and mid 8. Reduced carvedilol to 6.25 BID in hopes of better BP for UF  2. Systolic (EF 42-59) and diastolic HF with ECFV overload   1. CXR post thoracentesis improved aeration but still with interstitial edema.  2. Failure of diuretics. 3. See #1 above  3. Hypervolemic hyponatremia - should improve with HD  4. CKD-MBD - establish PTH status  5. Acute on chronic hypoxic resp failure. Multifactorial.  1. Volume component as addressed above.  2. Some improvement post thoracentesis 3. Should improve further with HD  6. CAP  - completed 4 days rocephin and azithro, CXR 11/20 no infiltrate  7. Anemia  - 2/2 CKD. s/p Aranesp 60 (gets Q4weeks at [facility])  on 03/27/17 and recent dose Feraheme.  1. Hb down some since adm but has not dropped under 10 2. Started Aranesp with HD 3. Ferrlecit load for low TSat  8. Diabetes - per primary   9. Post prandial diarrhea - takes imodium at home. Ordered 10. H/o Non-Hodgkins lymphoma  Jamal Maes, MD [facility] [phone] pager 04/06/2017, 9:51 AM

## 2017-04-06 NOTE — Plan of Care (Signed)
Patient exhausted from the day. 10/10 back pain and restless. Paged MD to receive pain medication.

## 2017-04-06 NOTE — Progress Notes (Signed)
PROGRESS NOTE    Destiny [name]  YBO:[id number] DOB: [birth year] DOA: 03/29/2017 PCP: Mosie Lukes, MD      Brief Narrative:  76 yo F CHF EF 20% with AICD, COPD on home oxygen of 2 L, CKD stage IV, anticoagulation with Coumadin with prior LV mural thrombus, and DM who presents with few days dyspnea.   Treated with 4 days ceftriaxone and azithromycin, no improvement.  Attempted diuresis but very little urine output, poorly quantified.  Urine foley placed and UOP low.  Progressively more dyspneic.     Assessment & Plan:  Principal Problem:   Acute on chronic combined systolic and diastolic CHF (congestive heart failure) (HCC) Active Problems:   Diabetes mellitus type 2 with retinopathy ([place])   Hyperlipidemia   Essential hypertension   Automatic implantable cardioverter-defibrillator in situ   Hypothyroidism   Anemia   Morbid obesity (HCC)   COPD, severe (HCC)   CKD (chronic kidney disease) stage 4, GFR 15-29 ml/min ([place])   Community acquired pneumonia   Acute hypoxemic respiratory failure (HCC)   Pressure injury of skin   Acute on chronic hypoxic respiratory failure from congestive heart failure, pleural effusion, ESRD Failed diuretics, elected to begin HD in consultation with Nephrology. Temp cath in [place] placed yesterday.   -HD per Nephrology -Stop diuretics   ESRD now on HD -HD per Nephrology -MBD w/u, CLIP, Aranesp per Nephrology -I can help arrange VVS on Monday  Acute on chronic systolic CHF -As above -Continue aspirin, statin, BB -BB dose adjusted down  Hyponatremia Improved -Daily BMP  Suspected Community Acquired pneumonia Completed course of antibiotics.  Pleural fluid with elevated LDH, low protein.  Meets Light's criteria, but suspect this is not parapneumonic.  Chronic COPD -Continue bronchodilators PRN  Diabetes -Continu NPH, decreased dose now on HD -Continue SSI  Atrial fibrillation CHADS2Vasc 4 at least.  On warfarin, amio. -Hold  warfarin, given Vit K for tunneled cath -Heparin bridge -Continue amiodarone, Coreg  Hypothyroidism -Continue levothyroxine  Other medications -Continue Bentyl -Continue Lexapro -Continue gabapentin, dose decreased now on HD  Elevated troponin Heart stretch in ESRD, doubt ACS      DVT prophylaxis: N/A Code Status: FULL Family Communication: Husband Disposition Plan: HD, CLIP, VVS consult.  Home when CLIP process complete.      Consultants:   Nephrology, EP, IR  Procedures:   Thoracentesis  HD  Antimicrobials:   Ceftraixone and azithromycin, completed 4 days    Subjective: On HD machine.  Apepars dyspneic, uncomfortable, but states her breathing is better.  No new chest pain, fever, cough, sputum, confusion.  Objective: Vitals:   04/06/17 0930 04/06/17 1000 04/06/17 1030 04/06/17 1100  BP: 120/60 (!) 124/56 124/61 125/61  Pulse: 78 76 73 70  Resp: 18 16 16 17   Temp:      TempSrc:      SpO2: 100% 100%    Weight:      Height:        Intake/Output Summary (Last 24 hours) at 04/06/2017 1227 Last data filed at 04/06/2017 2778 Gross per 24 hour  Intake 142 ml  Output 3850 ml  Net -3708 ml   Filed Weights   04/05/17 1417 04/05/17 1724 04/06/17 0900  Weight: 110.1 kg (242 lb 11.6 oz) 107.1 kg (236 lb 1.8 oz) 107 kg (235 lb 14.3 oz)    Examination: General appearance: Elderly obese adult female, on HD machine, interactive, oriented.   HEENT: Anicteric, conjunctiva pink, lids and lashes normal. No more periorbital  puffiness.  No nasal deformity, discharge, epistaxis.  Lips moist.   Skin: Warm and dry.  No jaundice.  No suspicious rashes or lesions.  Edema improved. Cardiac: RRR, nl S1-S2, SEM noted.  Capillary refill is brisk.  JVP not visible.  Trace LE edema.  Radia pulses 2+ and symmetric. Respiratory: Diminshed thoguhout, no rales, no wheezes. Abdomen: Abdomen soft.  No TTP. No ascites, distension, hepatosplenomegaly.   MSK: No deformities or  effusions. Neuro: Oriented, globally very weak.  Facies symmetric, moves upper exttremities equally. Psych: Affect flat.    Data Reviewed: I have personally reviewed following labs and imaging studies:  CBC: Recent Labs  Lab 03/31/17 0338 04/01/17 0508 04/02/17 0610 04/03/17 0414  WBC 6.4 8.4 2.7* 3.2*  HGB 10.5* 11.6* 10.4* 10.2*  HCT 33.0* 35.2* 32.6* 31.5*  MCV 91.2 90.5 90.1 89.0  PLT 104* 107* 85* 94*   Basic Metabolic Panel: Recent Labs  Lab 04/02/17 0610 04/03/17 0414 04/04/17 0329 04/05/17 0310 04/06/17 0419  NA 130* 128* 127* 128* 130*  K 5.1 5.1 5.1 5.1 4.1  CL 99* 97* 97* 97* 99*  CO2 22 22 22  21* 24  GLUCOSE 205* 328* 394* 107* 173*  BUN 74* 85* 92* 96* 70*  CREATININE 3.65* 3.64* 3.52* 3.65* 2.71*  CALCIUM 8.3* 8.3* 8.1* 8.4* 7.8*  PHOS  --   --  4.5 4.5 4.2   GFR: Estimated Creatinine Clearance: 21.7 mL/min (A) (by C-G formula based on SCr of 2.71 mg/dL (H)). Liver Function Tests: Recent Labs  Lab 04/04/17 0329 04/05/17 0310 04/06/17 0419  ALBUMIN 2.5* 2.8* 2.3*   No results for input(s): LIPASE, AMYLASE in the last 168 hours. No results for input(s): AMMONIA in the last 168 hours. Coagulation Profile: Recent Labs  Lab 04/02/17 0610 04/03/17 0414 04/04/17 0329 04/05/17 0310 04/06/17 0419  INR 3.86 3.80 3.52 2.68 1.54   Cardiac Enzymes: Recent Labs  Lab 04/05/17 1313 04/05/17 1435 04/05/17 2301  TROPONINI 0.03* 0.03* 0.04*   BNP (last 3 results) No results for input(s): PROBNP in the last 8760 hours. HbA1C: No results for input(s): HGBA1C in the last 72 hours. CBG: Recent Labs  Lab 04/05/17 0807 04/05/17 1134 04/05/17 1839 04/05/17 2251 04/06/17 0729  GLUCAP 123* 137* 175* 209* 156*   Lipid Profile: No results for input(s): CHOL, HDL, LDLCALC, TRIG, CHOLHDL, LDLDIRECT in the last 72 hours. Thyroid Function Tests: No results for input(s): TSH, T4TOTAL, FREET4, T3FREE, THYROIDAB in the last 72 hours. Anemia  Panel: Recent Labs    04/05/17 1718  TIBC 147*  IRON 23*   Urine analysis:    Component Value Date/Time   COLORURINE YELLOW 03/29/2017 2348   APPEARANCEUR HAZY (A) 03/29/2017 2348   LABSPEC 1.013 03/29/2017 2348   PHURINE 5.0 03/29/2017 2348   GLUCOSEU NEGATIVE 03/29/2017 2348   GLUCOSEU NEGATIVE 06/25/2016 1002   HGBUR NEGATIVE 03/29/2017 2348   HGBUR large 05/17/2008 [place] 03/29/2017 [place] 03/29/2017 2348   PROTEINUR NEGATIVE 03/29/2017 2348   UROBILINOGEN 0.2 06/25/2016 1002   NITRITE NEGATIVE 03/29/2017 2348   LEUKOCYTESUR LARGE (A) 03/29/2017 2348   Sepsis Labs: @LABRCNTIP (procalcitonin:4,lacticidven:4)  ) Recent Results (from the past 240 hour(s))  Culture, blood (routine x 2) Call MD if unable to obtain prior to antibiotics being given     Status: None   Collection Time: 03/29/17  3:45 PM  Result Value Ref Range Status   Specimen Description BLOOD LEFT ANTECUBITAL  Final   Special Requests  Final    BOTTLES DRAWN AEROBIC AND ANAEROBIC Blood Culture adequate volume   Culture NO GROWTH 5 DAYS  Final   Report Status 04/03/2017 FINAL  Final  Culture, blood (routine x 2) Call MD if unable to obtain prior to antibiotics being given     Status: None   Collection Time: 03/29/17  4:10 PM  Result Value Ref Range Status   Specimen Description BLOOD RIGHT ARM  Final   Special Requests   Final    BOTTLES DRAWN AEROBIC AND ANAEROBIC Blood Culture adequate volume   Culture NO GROWTH 5 DAYS  Final   Report Status 04/03/2017 FINAL  Final  MRSA PCR Screening     Status: None   Collection Time: 03/29/17  6:46 PM  Result Value Ref Range Status   MRSA by PCR NEGATIVE NEGATIVE Final    Comment:        The GeneXpert MRSA Assay (FDA approved for NASAL specimens only), is one component of a comprehensive MRSA colonization surveillance program. It is not intended to diagnose MRSA infection nor to guide or monitor treatment for MRSA  infections.   C difficile quick scan w PCR reflex     Status: None   Collection Time: 03/30/17 11:39 PM  Result Value Ref Range Status   C Diff antigen NEGATIVE NEGATIVE Final   C Diff toxin NEGATIVE NEGATIVE Final   C Diff interpretation No C. difficile detected.  Final  Giardia/Cryptosporidium EIA     Status: None   Collection Time: 03/30/17 11:39 PM  Result Value Ref Range Status   Giardia Ag, Stl Negative Negative Final   Cryptosporidium EIA Negative Negative Final    Comment: (NOTE) Performed At: [facility] [place], [place] [id number] Rush Farmer MD WU:[id number]    Source of Sample STOOL  Final  Culture, body fluid-bottle     Status: None (Preliminary result)   Collection Time: 04/02/17 12:59 PM  Result Value Ref Range Status   Specimen Description FLUID RIGHT PLEURAL  Final   Special Requests NONE  Final   Culture NO GROWTH 3 DAYS  Final   Report Status PENDING  Incomplete  Gram stain     Status: None   Collection Time: 04/02/17 12:59 PM  Result Value Ref Range Status   Specimen Description FLUID RIGHT PLEURAL  Final   Special Requests NONE  Final   Gram Stain NO WBC SEEN NO ORGANISMS SEEN   Final   Report Status 04/02/2017 FINAL  Final         Radiology Studies: Ir Fluoro Guide Cv Line Left  Result Date: 04/05/2017 INDICATION: Chronic kidney disease and fluid overload. Patient needs urgent dialysis. Plan for non tunneled catheter followed by a tunneled catheter when the patient's INR level has normalized. EXAM: FLUOROSCOPIC AND ULTRASOUND GUIDED PLACEMENT OF A NON-TUNNELED DIALYSIS CATHETER Physician: Stephan Minister. Henn, MD MEDICATIONS: None ANESTHESIA/SEDATION: None FLUOROSCOPY TIME:  Fluoroscopy Time: 24 seconds, 5 mGy COMPLICATIONS: None immediate. PROCEDURE: Informed consent was obtained for catheter placement. The patient was placed supine on the interventional table. Ultrasound confirmed a patent left internal jugular vein. Ultrasound  images were obtained for documentation. The left side of the neck was prepped and draped in a sterile fashion. The left side of the neck was anesthetized with 1% lidocaine. Maximal barrier sterile technique was utilized including caps, mask, sterile gowns, sterile gloves, sterile drape, hand hygiene and skin antiseptic. A small incision was made with #11 blade scalpel. A 21 gauge needle  directed into the left internal jugular vein with ultrasound guidance. A micropuncture dilator set was placed. A 20 cm Mahurkar catheter was selected. The catheter was advanced over a wire and positioned at the superior cavoatrial junction. Fluoroscopic images were obtained for documentation. Both dialysis lumens were found to aspirate and flush well. The proper amount of heparin was flushed in both lumens. The central venous lumen was flushed with normal saline. Catheter was sutured to skin. FINDINGS: Catheter tip at the superior cavoatrial junction. IMPRESSION: Successful placement of a left jugular non-tunneled dialysis catheter using ultrasound and fluoroscopic guidance. Electronically Signed   By: Markus Daft M.D.   On: 04/05/2017 15:04   Ir US Guide Vasc Access Left  Result Date: 04/05/2017 INDICATION: Chronic kidney disease and fluid overload. Patient needs urgent dialysis. Plan for non tunneled catheter followed by a tunneled catheter when the patient's INR level has normalized. EXAM: FLUOROSCOPIC AND ULTRASOUND GUIDED PLACEMENT OF A NON-TUNNELED DIALYSIS CATHETER Physician: Stephan Minister. Henn, MD MEDICATIONS: None ANESTHESIA/SEDATION: None FLUOROSCOPY TIME:  Fluoroscopy Time: 24 seconds, 5 mGy COMPLICATIONS: None immediate. PROCEDURE: Informed consent was obtained for catheter placement. The patient was placed supine on the interventional table. Ultrasound confirmed a patent left internal jugular vein. Ultrasound images were obtained for documentation. The left side of the neck was prepped and draped in a sterile fashion. The  left side of the neck was anesthetized with 1% lidocaine. Maximal barrier sterile technique was utilized including caps, mask, sterile gowns, sterile gloves, sterile drape, hand hygiene and skin antiseptic. A small incision was made with #11 blade scalpel. A 21 gauge needle directed into the left internal jugular vein with ultrasound guidance. A micropuncture dilator set was placed. A 20 cm Mahurkar catheter was selected. The catheter was advanced over a wire and positioned at the superior cavoatrial junction. Fluoroscopic images were obtained for documentation. Both dialysis lumens were found to aspirate and flush well. The proper amount of heparin was flushed in both lumens. The central venous lumen was flushed with normal saline. Catheter was sutured to skin. FINDINGS: Catheter tip at the superior cavoatrial junction. IMPRESSION: Successful placement of a left jugular non-tunneled dialysis catheter using ultrasound and fluoroscopic guidance. Electronically Signed   By: Markus Daft M.D.   On: 04/05/2017 15:04   Dg Chest Port 1 View  Result Date: 04/05/2017 CLINICAL DATA:  Acute respiratory distress. EXAM: PORTABLE CHEST 1 VIEW COMPARISON:  04/02/2017 FINDINGS: Postoperative changes in the mediastinum. Cardiac pacemaker. Shallow inspiration. Cardiac enlargement. Diffuse pulmonary infiltrates, greater on the right. This likely represents edema although multifocal pneumonia could also have this appearance. Bilateral pleural effusions are probable. Progression since previous study. Calcified and tortuous aorta. Degenerative changes in the spine. Old right rib fractures. IMPRESSION: Cardiac enlargement with diffuse pulmonary infiltrates, likely edema, and bilateral pleural effusions with progression since previous study. Electronically Signed   By: Lucienne Capers M.D.   On: 04/05/2017 01:06        Scheduled Meds: . acetaminophen      . amiodarone  200 mg Oral Daily  . atorvastatin  10 mg Oral q1800  .  carvedilol  6.25 mg Oral BID WC  . darbepoetin (ARANESP) injection - DIALYSIS  60 mcg Intravenous Q Fri-HD  . escitalopram  10 mg Oral Daily  . feeding supplement (NEPRO CARB STEADY)  237 mL Oral BID BM  . gabapentin  100 mg Oral QHS  . Gerhardt's butt cream   Topical TID  . insulin aspart  0-15 Units Subcutaneous  TID WC  . insulin aspart  0-5 Units Subcutaneous QHS  . insulin NPH Human  15 Units Subcutaneous QAC breakfast  . ipratropium-albuterol  3 mL Nebulization Q6H  . levothyroxine  88 mcg Oral QAC breakfast  . loperamide  2 mg Oral Daily  . midodrine      . sodium chloride flush  3 mL Intravenous Q12H   Continuous Infusions: . sodium chloride    . ferric gluconate (FERRLECIT/NULECIT) IV    . heparin 1,200 Units/hr (04/06/17 0837)     LOS: 8 days    Time spent: 15 minutes    Edwin Dada, MD [facility] Pager [phone]  If 7PM-7AM, please contact night-coverage [email or web address] Password [facility] 04/06/2017, 12:27 PM

## 2017-04-06 NOTE — Procedures (Deleted)
TPE #5 of 7 1 plasma volume Albumin For 2 units of blood today No BM yet  Jamal Maes, MD [place] Pager 04/06/2017, 1:56 PM

## 2017-04-07 LAB — RENAL FUNCTION PANEL
ALBUMIN: 2.5 g/dL — AB (ref 3.5–5.0)
ANION GAP: 9 (ref 5–15)
BUN: 44 mg/dL — AB (ref 6–20)
CALCIUM: 7.7 mg/dL — AB (ref 8.9–10.3)
CO2: 24 mmol/L (ref 22–32)
CREATININE: 2.24 mg/dL — AB (ref 0.44–1.00)
Chloride: 100 mmol/L — ABNORMAL LOW (ref 101–111)
GFR calc Af Amer: 24 mL/min — ABNORMAL LOW (ref >60)
GFR calc non Af Amer: 20 mL/min — ABNORMAL LOW (ref >60)
GLUCOSE: 87 mg/dL (ref 65–99)
PHOSPHORUS: 3.1 mg/dL (ref 2.5–4.6)
Potassium: 3.9 mmol/L (ref 3.5–5.1)
SODIUM: 133 mmol/L — AB (ref 135–145)

## 2017-04-07 LAB — CULTURE, BODY FLUID W GRAM STAIN -BOTTLE: Culture: NO GROWTH

## 2017-04-07 LAB — CBC
HCT: 33.9 % — ABNORMAL LOW (ref 36.0–46.0)
HEMOGLOBIN: 11.1 g/dL — AB (ref 12.0–15.0)
MCH: 29.3 pg (ref 26.0–34.0)
MCHC: 32.7 g/dL (ref 30.0–36.0)
MCV: 89.4 fL (ref 78.0–100.0)
PLATELETS: 85 10*3/uL — AB (ref 150–400)
RBC: 3.79 MIL/uL — ABNORMAL LOW (ref 3.87–5.11)
RDW: 15.1 % (ref 11.5–15.5)
WBC: 6.9 10*3/uL (ref 4.0–10.5)

## 2017-04-07 LAB — HEPARIN LEVEL (UNFRACTIONATED): HEPARIN UNFRACTIONATED: 0.2 [IU]/mL — AB (ref 0.30–0.70)

## 2017-04-07 LAB — GLUCOSE, CAPILLARY
GLUCOSE-CAPILLARY: 96 mg/dL (ref 65–99)
Glucose-Capillary: 128 mg/dL — ABNORMAL HIGH (ref 65–99)
Glucose-Capillary: 128 mg/dL — ABNORMAL HIGH (ref 65–99)
Glucose-Capillary: 178 mg/dL — ABNORMAL HIGH (ref 65–99)

## 2017-04-07 LAB — PROTIME-INR
INR: 1.44
Prothrombin Time: 17.4 seconds — ABNORMAL HIGH (ref 11.4–15.2)

## 2017-04-07 MED ORDER — INSULIN ASPART 100 UNIT/ML ~~LOC~~ SOLN
0.0000 [IU] | Freq: Three times a day (TID) | SUBCUTANEOUS | Status: DC
  Administered 2017-04-07 (×2): 1 [IU] via SUBCUTANEOUS
  Administered 2017-04-08: 2 [IU] via SUBCUTANEOUS
  Administered 2017-04-08 – 2017-04-09 (×3): 1 [IU] via SUBCUTANEOUS
  Administered 2017-04-11 – 2017-04-13 (×4): 2 [IU] via SUBCUTANEOUS
  Administered 2017-04-14: 1 [IU] via SUBCUTANEOUS
  Administered 2017-04-14: 2 [IU] via SUBCUTANEOUS
  Administered 2017-04-14 – 2017-04-16 (×5): 1 [IU] via SUBCUTANEOUS

## 2017-04-07 MED ORDER — INSULIN NPH (HUMAN) (ISOPHANE) 100 UNIT/ML ~~LOC~~ SUSP
10.0000 [IU] | Freq: Every day | SUBCUTANEOUS | Status: DC
  Administered 2017-04-08 – 2017-04-16 (×4): 10 [IU] via SUBCUTANEOUS
  Filled 2017-04-07 (×2): qty 10

## 2017-04-07 NOTE — Evaluation (Signed)
Physical Therapy Evaluation Patient Details Name: Destiny [name] MRN: [id number] DOB: 1940/08/03 Today's Date: 04/07/2017   History of Present Illness  Pt is a 76 y.o. female who presented to the ED with 2 day history of worsening shortness of breath. Found to have B pleural effusions and volume overload. Attempted diuresis but ultimately required HD, new dx ESRD.  PMH significant for CHF with EF 20% with AICD, COPD on home oxygen of 2L, CKD stage IV, obesity, HTN, anemia, pressure injury, and diabetes.   Clinical Impression  Patient presents with problems listed below.  Will benefit from acute PT to maximize functional mobility prior to discharge.  PTA patient was ambulatory with rollator.  Today, patient limited due to DOE.  Feel patient would benefit from SNF at d/c for longer period of recovery.    Follow Up Recommendations SNF;Supervision/Assistance - 24 hour    Equipment Recommendations  Wheelchair (measurements PT);Wheelchair cushion (measurements PT)    Recommendations for Other Services       Precautions / Restrictions Precautions Precautions: Fall Precaution Comments: Watch O2 saturation Restrictions Weight Bearing Restrictions: No      Mobility  Bed Mobility Overal bed mobility: Needs Assistance Bed Mobility: Rolling Rolling: Max assist Sidelying to sit: Max assist;Total assist       General bed mobility comments: Patient reports she only needs a little help to roll, however required mod-max assist.  Declined further mobility due to DOE.  Sats remaining in 90's.  Transfers                 General transfer comment: unsafe to attempt  Ambulation/Gait                Stairs            Wheelchair Mobility    Modified Rankin (Stroke Patients Only)       Balance Overall balance assessment: (will continue to assess)                                           Pertinent Vitals/Pain Pain Assessment: No/denies pain Faces  Pain Scale: Hurts a little bit Pain Location: back/sacrum with bed mobility Pain Descriptors / Indicators: Discomfort Pain Intervention(s): Monitored during session;Repositioned    Home Living Family/patient expects to be discharged to:: Private residence Living Arrangements: Spouse/significant other Available Help at Discharge: Family;Available 24 hours/day Type of Home: House Home Access: Ramped entrance     Home Layout: One level Home Equipment: Clinical cytogeneticist - 4 wheels;Bedside commode;Grab bars - toilet;Grab bars - tub/shower;Transport chair;Hand held shower head;Cane - single point Additional Comments: Will continue to assess home set-up    Prior Function Level of Independence: Independent with assistive device(s);Needs assistance   Gait / Transfers Assistance Needed: Uses Rollator  ADL's / Homemaking Assistance Needed: reported independence with ADL but on previous admission reported assistance from husband.         Hand Dominance   Dominant Hand: Right    Extremity/Trunk Assessment   Upper Extremity Assessment Upper Extremity Assessment: Defer to OT evaluation    Lower Extremity Assessment Lower Extremity Assessment: Generalized weakness;RLE deficits/detail RLE Deficits / Details: Patient reports decreased sensation Rt foot - plantar surface.  Strength 3/5 ankle and knee movements; 3-/5 hip flexion. RLE Sensation: decreased light touch RLE Coordination: decreased gross motor LLE Deficits / Details: Strength 3/5 ankle and knee movements; 3-/5 hip flexion.  Communication   Communication: No difficulties  Cognition Arousal/Alertness: Awake/alert Behavior During Therapy: Anxious Overall Cognitive Status: No family/caregiver present to determine baseline cognitive functioning                                 General Comments: Patient reports "I just had a reaction to a medicine they gave me."  Patient with increased WOB.  RN to room to  investigate - no reaction had occurred.  Patient received breathing treatment with resultant decrease of WOB.  Patient slow to answer questions and follow commands.  O2 sats remained in 90's during session.      General Comments General comments (skin integrity, edema, etc.): SpO2 >90% throughout session on 3 L O2 via [place].     Exercises     Assessment/Plan    PT Assessment Patient needs continued PT services  PT Problem List Decreased strength;Decreased activity tolerance;Decreased balance;Decreased mobility;Decreased knowledge of use of DME;Cardiopulmonary status limiting activity;Obesity       PT Treatment Interventions DME instruction;Gait training;Functional mobility training;Therapeutic activities;Therapeutic exercise;Balance training;Patient/family education    PT Goals (Current goals can be found in the Care Plan section)  Acute Rehab PT Goals Patient Stated Goal: to get better and go home PT Goal Formulation: With patient Time For Goal Achievement: 04/21/17 Potential to Achieve Goals: Fair    Frequency Min 3X/week   Barriers to discharge        Co-evaluation               AM-PAC PT "6 Clicks" Daily Activity  Outcome Measure Difficulty turning over in bed (including adjusting bedclothes, sheets and blankets)?: Unable Difficulty moving from lying on back to sitting on the side of the bed? : Unable Difficulty sitting down on and standing up from a chair with arms (e.g., wheelchair, bedside commode, etc,.)?: Unable Help needed moving to and from a bed to chair (including a wheelchair)?: A Lot Help needed walking in hospital room?: Total Help needed climbing 3-5 steps with a railing? : Total 6 Click Score: 7    End of Session Equipment Utilized During Treatment: Oxygen Activity Tolerance: Patient limited by fatigue;Treatment limited secondary to medical complications (Comment)(Limited by increased WOB.) Patient left: in bed;with call bell/phone within reach;with  bed alarm set Nurse Communication: Need for lift equipment PT Visit Diagnosis: Unsteadiness on feet (R26.81);Muscle weakness (generalized) (M62.81);Difficulty in walking, not elsewhere classified (R26.2)    Time: 6767-2094 PT Time Calculation (min) (ACUTE ONLY): 20 min   Charges:   PT Evaluation $PT Eval Moderate Complexity: 1 Mod     PT G Codes:        Carita Pian. Sanjuana Kava, [facility] Acute Rehab Services Pager [place] 04/07/2017, 5:23 PM

## 2017-04-07 NOTE — Progress Notes (Signed)
PROGRESS NOTE    Destiny [name]  HEN:[id number] DOB: 09-11-1940 DOA: 03/29/2017 PCP: Mosie Lukes, MD      Brief Narrative:  76 yo F CHF EF 20% with AICD, COPD on home oxygen of 2 L, CKD stage IV, anticoagulation with Coumadin with prior LV mural thrombus, and DM who presents with few days dyspnea.   Treated with 4 days ceftriaxone and azithromycin, no improvement.  Attempted diuresis but very little urine output, so started dialysis.     Assessment & Plan:  Principal Problem:   Acute on chronic combined systolic and diastolic CHF (congestive heart failure) (HCC) Active Problems:   Diabetes mellitus type 2 with retinopathy ([place])   Hyperlipidemia   Essential hypertension   Automatic implantable cardioverter-defibrillator in situ   Hypothyroidism   Anemia   Morbid obesity (HCC)   COPD, severe (HCC)   CKD (chronic kidney disease) stage 4, GFR 15-29 ml/min ([place])   Community acquired pneumonia   Acute hypoxemic respiratory failure (HCC)   Pressure injury of skin   Acute on chronic hypoxic respiratory failure from congestive heart failure, pleural effusion, ESRD Failed diuretics, elected to begin HD in consultation with Nephrology. Temp cath in [place] placed.   -HD per Nephrology   ESRD now on HD -HD per Nephrology -MBD w/u, CLIP, Aranesp per Nephrology -I can help arrange VVS on Monday as well as tunneled catheter   Acute on chronic systolic CHF -As above -Continue aspirin, statin, BB -BB dose adjusted down  Hyponatremia Improved -Daily BMP  Suspected Community Acquired pneumonia Completed course of antibiotics.  Pleural fluid with elevated LDH, low protein.  Meets Light's criteria, but suspect this is not parapneumonic.  Chronic COPD -Continue bronchodilators PRN  Diabetes -Continu NPH, decrease dose again -Continue SSI  Atrial fibrillation CHADS2Vasc 4 at least, but never stroke, LV thrombus years ago.  On warfarin, amio. -Hold warfarin, for tunneled  cath -Overall low day to day risk of stroke, in light of thrombocytopenia, stop heparin, can just restart warfarin after tunn cath placement -Continue amiodarone, Coreg  Hypothyroidism -Continue levothyroxine  Other medications -Continue Bentyl -Continue Lexapro -Continue gabapentin, dose decreased now on HD  Elevated troponin Heart stretch in ESRD, doubt ACS      DVT prophylaxis: N/A Code Status: FULL Family Communication: Husband Disposition Plan: HD, CLIP, VVS consult.  Home when CLIP process complete.      Consultants:   Nephrology, EP, IR  Procedures:   Thoracentesis  HD  Antimicrobials:   Ceftraixone and azithromycin, completed 4 days    Subjective: Feeling better.  Appears very tired, weak.  No new chest pain, fever, cough, sputum, confusion.  Objective: Vitals:   04/07/17 0111 04/07/17 0422 04/07/17 0532 04/07/17 0738  BP:   114/60 (!) 119/54  Pulse: 73 80 74 69  Resp: 16 17 17 19   Temp:   97.9 F (36.6 C)   TempSrc:   Axillary   SpO2: 94% 98% 95% 97%  Weight:   103.1 kg (227 lb 4.7 oz)   Height:        Intake/Output Summary (Last 24 hours) at 04/07/2017 1050 Last data filed at 04/07/2017 0547 Gross per 24 hour  Intake 388.95 ml  Output 2350 ml  Net -1961.05 ml   Filed Weights   04/06/17 0900 04/06/17 1232 04/07/17 0532  Weight: 107 kg (235 lb 14.3 oz) 105 kg (231 lb 7.7 oz) 103.1 kg (227 lb 4.7 oz)    Examination: General appearance: Elderly obese adult female, interactive,  oriented.   HEENT: Anicteric, conjunctiva pink, lids and lashes normal. No more periorbital puffiness.  No nasal deformity, discharge, epistaxis.  Lips moist.   Skin: Warm and dry.  No jaundice.  No suspicious rashes or lesions.  Edema improved. Cardiac: RRR, nl S1-S2, SEM noted.  Capillary refill is brisk.  JVP not visible.  Trace LE edema.  Radia pulses 2+ and symmetric. Respiratory: Very labored breathing, appears tired.  Rales in bases.  Diminshed thoguhout,  no wheezes. Abdomen: Abdomen soft.  No TTP. No ascites, distension, hepatosplenomegaly.   MSK: No deformities or effusions. Neuro: Oriented, globally very weak.  Facies symmetric, moves upper exttremities equally. Psych: Affect flat.    Data Reviewed: I have personally reviewed following labs and imaging studies:  CBC: Recent Labs  Lab 04/01/17 0508 04/02/17 0610 04/03/17 0414 04/07/17 0332  WBC 8.4 2.7* 3.2* 6.9  HGB 11.6* 10.4* 10.2* 11.1*  HCT 35.2* 32.6* 31.5* 33.9*  MCV 90.5 90.1 89.0 89.4  PLT 107* 85* 94* 85*   Basic Metabolic Panel: Recent Labs  Lab 04/03/17 0414 04/04/17 0329 04/05/17 0310 04/06/17 0419 04/07/17 0332  NA 128* 127* 128* 130* 133*  K 5.1 5.1 5.1 4.1 3.9  CL 97* 97* 97* 99* 100*  CO2 22 22 21* 24 24  GLUCOSE 328* 394* 107* 173* 87  BUN 85* 92* 96* 70* 44*  CREATININE 3.64* 3.52* 3.65* 2.71* 2.24*  CALCIUM 8.3* 8.1* 8.4* 7.8* 7.7*  PHOS  --  4.5 4.5 4.2 3.1   GFR: Estimated Creatinine Clearance: 25.8 mL/min (A) (by C-G formula based on SCr of 2.24 mg/dL (H)). Liver Function Tests: Recent Labs  Lab 04/04/17 0329 04/05/17 0310 04/06/17 0419 04/07/17 0332  ALBUMIN 2.5* 2.8* 2.3* 2.5*   No results for input(s): LIPASE, AMYLASE in the last 168 hours. No results for input(s): AMMONIA in the last 168 hours. Coagulation Profile: Recent Labs  Lab 04/03/17 0414 04/04/17 0329 04/05/17 0310 04/06/17 0419 04/07/17 0332  INR 3.80 3.52 2.68 1.54 1.44   Cardiac Enzymes: Recent Labs  Lab 04/05/17 1313 04/05/17 1435 04/05/17 2301  TROPONINI 0.03* 0.03* 0.04*   BNP (last 3 results) No results for input(s): PROBNP in the last 8760 hours. HbA1C: No results for input(s): HGBA1C in the last 72 hours. CBG: Recent Labs  Lab 04/06/17 0729 04/06/17 1311 04/06/17 1647 04/06/17 1957 04/07/17 0737  GLUCAP 156* 115* 128* 91 96   Lipid Profile: No results for input(s): CHOL, HDL, LDLCALC, TRIG, CHOLHDL, LDLDIRECT in the last 72  hours. Thyroid Function Tests: No results for input(s): TSH, T4TOTAL, FREET4, T3FREE, THYROIDAB in the last 72 hours. Anemia Panel: Recent Labs    04/05/17 1718  TIBC 147*  IRON 23*   Urine analysis:    Component Value Date/Time   COLORURINE YELLOW 03/29/2017 2348   APPEARANCEUR HAZY (A) 03/29/2017 2348   LABSPEC 1.013 03/29/2017 2348   PHURINE 5.0 03/29/2017 2348   GLUCOSEU NEGATIVE 03/29/2017 2348   GLUCOSEU NEGATIVE 06/25/2016 1002   HGBUR NEGATIVE 03/29/2017 2348   HGBUR large 05/17/2008 [place] 03/29/2017 [place] 03/29/2017 2348   PROTEINUR NEGATIVE 03/29/2017 2348   UROBILINOGEN 0.2 06/25/2016 1002   NITRITE NEGATIVE 03/29/2017 2348   LEUKOCYTESUR LARGE (A) 03/29/2017 2348   Sepsis Labs: @LABRCNTIP (procalcitonin:4,lacticidven:4)  ) Recent Results (from the past 240 hour(s))  Culture, blood (routine x 2) Call MD if unable to obtain prior to antibiotics being given     Status: None   Collection Time:  03/29/17  3:45 PM  Result Value Ref Range Status   Specimen Description BLOOD LEFT ANTECUBITAL  Final   Special Requests   Final    BOTTLES DRAWN AEROBIC AND ANAEROBIC Blood Culture adequate volume   Culture NO GROWTH 5 DAYS  Final   Report Status 04/03/2017 FINAL  Final  Culture, blood (routine x 2) Call MD if unable to obtain prior to antibiotics being given     Status: None   Collection Time: 03/29/17  4:10 PM  Result Value Ref Range Status   Specimen Description BLOOD RIGHT ARM  Final   Special Requests   Final    BOTTLES DRAWN AEROBIC AND ANAEROBIC Blood Culture adequate volume   Culture NO GROWTH 5 DAYS  Final   Report Status 04/03/2017 FINAL  Final  MRSA PCR Screening     Status: None   Collection Time: 03/29/17  6:46 PM  Result Value Ref Range Status   MRSA by PCR NEGATIVE NEGATIVE Final    Comment:        The GeneXpert MRSA Assay (FDA approved for NASAL specimens only), is one component of a comprehensive MRSA  colonization surveillance program. It is not intended to diagnose MRSA infection nor to guide or monitor treatment for MRSA infections.   C difficile quick scan w PCR reflex     Status: None   Collection Time: 03/30/17 11:39 PM  Result Value Ref Range Status   C Diff antigen NEGATIVE NEGATIVE Final   C Diff toxin NEGATIVE NEGATIVE Final   C Diff interpretation No C. difficile detected.  Final  Giardia/Cryptosporidium EIA     Status: None   Collection Time: 03/30/17 11:39 PM  Result Value Ref Range Status   Giardia Ag, Stl Negative Negative Final   Cryptosporidium EIA Negative Negative Final    Comment: (NOTE) Performed At: [facility] [place], [place] [id number] Rush Farmer MD PX:[phone]    Source of Sample STOOL  Final  Culture, body fluid-bottle     Status: None (Preliminary result)   Collection Time: 04/02/17 12:59 PM  Result Value Ref Range Status   Specimen Description FLUID RIGHT PLEURAL  Final   Special Requests NONE  Final   Culture NO GROWTH 4 DAYS  Final   Report Status PENDING  Incomplete  Gram stain     Status: None   Collection Time: 04/02/17 12:59 PM  Result Value Ref Range Status   Specimen Description FLUID RIGHT PLEURAL  Final   Special Requests NONE  Final   Gram Stain NO WBC SEEN NO ORGANISMS SEEN   Final   Report Status 04/02/2017 FINAL  Final         Radiology Studies: Ir Fluoro Guide Cv Line Left  Result Date: 04/05/2017 INDICATION: Chronic kidney disease and fluid overload. Patient needs urgent dialysis. Plan for non tunneled catheter followed by a tunneled catheter when the patient's INR level has normalized. EXAM: FLUOROSCOPIC AND ULTRASOUND GUIDED PLACEMENT OF A NON-TUNNELED DIALYSIS CATHETER Physician: Stephan Minister. Henn, MD MEDICATIONS: None ANESTHESIA/SEDATION: None FLUOROSCOPY TIME:  Fluoroscopy Time: 24 seconds, 5 mGy COMPLICATIONS: None immediate. PROCEDURE: Informed consent was obtained for catheter placement.  The patient was placed supine on the interventional table. Ultrasound confirmed a patent left internal jugular vein. Ultrasound images were obtained for documentation. The left side of the neck was prepped and draped in a sterile fashion. The left side of the neck was anesthetized with 1% lidocaine. Maximal barrier sterile technique was utilized including caps,  mask, sterile gowns, sterile gloves, sterile drape, hand hygiene and skin antiseptic. A small incision was made with #11 blade scalpel. A 21 gauge needle directed into the left internal jugular vein with ultrasound guidance. A micropuncture dilator set was placed. A 20 cm Mahurkar catheter was selected. The catheter was advanced over a wire and positioned at the superior cavoatrial junction. Fluoroscopic images were obtained for documentation. Both dialysis lumens were found to aspirate and flush well. The proper amount of heparin was flushed in both lumens. The central venous lumen was flushed with normal saline. Catheter was sutured to skin. FINDINGS: Catheter tip at the superior cavoatrial junction. IMPRESSION: Successful placement of a left jugular non-tunneled dialysis catheter using ultrasound and fluoroscopic guidance. Electronically Signed   By: Markus Daft M.D.   On: 04/05/2017 15:04   Ir US Guide Vasc Access Left  Result Date: 04/05/2017 INDICATION: Chronic kidney disease and fluid overload. Patient needs urgent dialysis. Plan for non tunneled catheter followed by a tunneled catheter when the patient's INR level has normalized. EXAM: FLUOROSCOPIC AND ULTRASOUND GUIDED PLACEMENT OF A NON-TUNNELED DIALYSIS CATHETER Physician: Stephan Minister. Henn, MD MEDICATIONS: None ANESTHESIA/SEDATION: None FLUOROSCOPY TIME:  Fluoroscopy Time: 24 seconds, 5 mGy COMPLICATIONS: None immediate. PROCEDURE: Informed consent was obtained for catheter placement. The patient was placed supine on the interventional table. Ultrasound confirmed a patent left internal jugular  vein. Ultrasound images were obtained for documentation. The left side of the neck was prepped and draped in a sterile fashion. The left side of the neck was anesthetized with 1% lidocaine. Maximal barrier sterile technique was utilized including caps, mask, sterile gowns, sterile gloves, sterile drape, hand hygiene and skin antiseptic. A small incision was made with #11 blade scalpel. A 21 gauge needle directed into the left internal jugular vein with ultrasound guidance. A micropuncture dilator set was placed. A 20 cm Mahurkar catheter was selected. The catheter was advanced over a wire and positioned at the superior cavoatrial junction. Fluoroscopic images were obtained for documentation. Both dialysis lumens were found to aspirate and flush well. The proper amount of heparin was flushed in both lumens. The central venous lumen was flushed with normal saline. Catheter was sutured to skin. FINDINGS: Catheter tip at the superior cavoatrial junction. IMPRESSION: Successful placement of a left jugular non-tunneled dialysis catheter using ultrasound and fluoroscopic guidance. Electronically Signed   By: Markus Daft M.D.   On: 04/05/2017 15:04        Scheduled Meds: . amiodarone  200 mg Oral Daily  . atorvastatin  10 mg Oral q1800  . carvedilol  6.25 mg Oral BID WC  . darbepoetin (ARANESP) injection - DIALYSIS  60 mcg Intravenous Q Fri-HD  . escitalopram  10 mg Oral Daily  . feeding supplement (NEPRO CARB STEADY)  237 mL Oral BID BM  . gabapentin  100 mg Oral QHS  . Gerhardt's butt cream   Topical TID  . insulin aspart  0-15 Units Subcutaneous TID WC  . insulin aspart  0-5 Units Subcutaneous QHS  . insulin NPH Human  15 Units Subcutaneous QAC breakfast  . ipratropium-albuterol  3 mL Nebulization Q6H  . levothyroxine  88 mcg Oral QAC breakfast  . loperamide  2 mg Oral Daily  . sodium chloride flush  3 mL Intravenous Q12H   Continuous Infusions: . sodium chloride    . ferric gluconate  (FERRLECIT/NULECIT) IV 125 mg (04/06/17 1310)  . heparin 1,750 Units/hr (04/07/17 0956)     LOS: 9 days  Time spent: 25 minutes    Edwin Dada, MD [facility] Pager [phone]  If 7PM-7AM, please contact night-coverage [email or web address] Password [email or web address] 04/07/2017, 10:50 AM

## 2017-04-07 NOTE — Progress Notes (Addendum)
[place] for Heparin Indication: atrial fibrillation   Allergies  Allergen Reactions  . Sulfonamide Derivatives Other (See Comments)    Reaction unknown; "childhood allergy/mother"    Patient Measurements: Height: 5\' 4"  (162.6 cm) Weight: 227 lb 4.7 oz (103.1 kg) IBW/kg (Calculated) : 54.7  Heparin Dosing weight: 80 kg  Vital Signs: Temp: 97.9 F (36.6 C) (11/25 0532) Temp Source: Axillary (11/25 0532) BP: 119/54 (11/25 0738) Pulse Rate: 69 (11/25 0738)  Labs: Recent Labs    04/05/17 0310 04/05/17 1313 04/05/17 1435 04/05/17 2301 04/06/17 0419 04/06/17 1542 04/07/17 0332 04/07/17 0816  HGB  --   --   --   --   --   --  11.1*  --   HCT  --   --   --   --   --   --  33.9*  --   PLT  --   --   --   --   --   --  85*  --   LABPROT 28.3*  --   --   --  18.3*  --  17.4*  --   INR 2.68  --   --   --  1.54  --  1.44  --   HEPARINUNFRC  --   --   --   --   --  <0.10*  --  0.20*  CREATININE 3.65*  --   --   --  2.71*  --  2.24*  --   TROPONINI  --  0.03* 0.03* 0.04*  --   --   --   --     Estimated Creatinine Clearance: 25.8 mL/min (A) (by C-G formula based on SCr of 2.24 mg/dL (H)).   Assessment: 76 year old female admitted with shortness of breath. She is on chronic anticoagulation with Coumadin for atrial fibrillation and history of a mural thrombus. Coumadin was being held since INR has been supratherapeutic and Vit K 5 mg given in anticipation for right tunneled cath procedure. Pharmacy has been consulted to dose heparin while coumadin is off and as a bridge back to coumadin.   CBC stable, no bleeding reported.   INR 1.44 this AM Initial heparin level <0.10 Second heparin level 0.20  Goal of Therapy:  Heparin level 0.3-0.7 Monitor platelets by anticoagulation protocol: Yes   Plan:  Increase heparin to 1750 units/hr Heparin level in 8 hours Daily heparin levels and CBC   Leroy Libman, PharmD Pharmacy Resident Pager:  [phone]   Addendum: -  Heparin discontinued per MD. Plans to resume warfarin after tunneled catheter placement on 11/26.

## 2017-04-07 NOTE — Progress Notes (Signed)
Pt requested to be place back on bipap for the night. RT will continue to monitor.

## 2017-04-07 NOTE — Evaluation (Signed)
Occupational Therapy Evaluation Patient Details Name: Destiny [name] MRN: [id number] DOB: 19-Feb-1941 Today's Date: 04/07/2017    History of Present Illness Pt is a 76 y.o. female who presented to the ED with 2 day history of worsening shortness of breath. Found to have B pleural effusions and volume overload. Attempted diuresis but ultimately required HD. PMH significant for CHF with EF 20% with AICD, COPD on home oxygen of 2L, CKD stage IV, anticoagulation with Coumadin with prior LV mural thrombus, and diabetes.    Clinical Impression   Pt limited this session by dyspnea with even bed level activities and generalized weakness although vital signs were stable throughout session on 3L O2 via [place]. She reports completing functional mobility and ADL transfers with rollator and no physical assistance PTA. She currently requires set-up for bed level self-feeding and grooming tasks but total assist for all other ADL tasks. Attempted to sit at EOB with max-total assistance and pt unable to assume fully upright position. She is very motivated to improve her independence and ability to participate in ADL. She would benefit from continued OT services while admitted to improve independence with ADL and functional mobility. Recommend SNF level rehabilitation post-acute D/C to maximize recovery.     Follow Up Recommendations  SNF;Supervision/Assistance - 24 hour    Equipment Recommendations  Other (comment)(TBD at next venue of care)    Recommendations for Other Services       Precautions / Restrictions Precautions Precautions: Fall Precaution Comments: Watch O2 saturation Restrictions Weight Bearing Restrictions: No      Mobility Bed Mobility Overal bed mobility: Needs Assistance Bed Mobility: Rolling;Sidelying to Sit Rolling: Max assist;Mod assist(Max to the R; mod to the L) Sidelying to sit: Max assist;Total assist       General bed mobility comments: Able to initiate rise to upright  position but unable to assume fully upright positioning.   Transfers                 General transfer comment: unsafe to attempt    Balance Overall balance assessment: (will continue to assess)                                         ADL either performed or assessed with clinical judgement   ADL Overall ADL's : Needs assistance/impaired Eating/Feeding: Set up;Bed level   Grooming: Set up;Bed level   Upper Body Bathing: Total assistance;Bed level   Lower Body Bathing: Total assistance;Bed level   Upper Body Dressing : Total assistance;Bed level   Lower Body Dressing: Total assistance;Bed level                 General ADL Comments: Pt attempted to sit at EOB and unable to assume fully upright positioning despite max assist. Dyspnea with even bed level activity.      Vision Baseline Vision/History: Wears glasses Wears Glasses: Reading only Patient Visual Report: No change from baseline Vision Assessment?: No apparent visual deficits     Perception     Praxis      Pertinent Vitals/Pain Pain Assessment: Faces Faces Pain Scale: Hurts a little bit Pain Location: back/sacrum with bed mobility Pain Descriptors / Indicators: Discomfort Pain Intervention(s): Monitored during session;Repositioned     Hand Dominance     Extremity/Trunk Assessment Upper Extremity Assessment Upper Extremity Assessment: Generalized weakness(Grossly 4/5)   Lower Extremity Assessment Lower Extremity Assessment: Defer to  PT evaluation       Communication Communication Communication: No difficulties   Cognition Arousal/Alertness: Awake/alert Behavior During Therapy: WFL for tasks assessed/performed Overall Cognitive Status: No family/caregiver present to determine baseline cognitive functioning                                 General Comments: Pt sleepy and slow to respond at times.    General Comments  SpO2 >90% throughout session on 3 L O2  via [place].     Exercises     Shoulder Instructions      Home Living Family/patient expects to be discharged to:: Private residence Living Arrangements: Spouse/significant other Available Help at Discharge: Family;Available 24 hours/day Type of Home: House             Bathroom Shower/Tub: Walk-in Corporate treasurer Toilet: Handicapped height     Home Equipment: Clinical cytogeneticist - 4 wheels;Bedside commode;Grab bars - toilet;Grab bars - tub/shower;Transport chair;Hand held shower head   Additional Comments: Will continue to assess home set-up      Prior Functioning/Environment Level of Independence: Needs assistance  Gait / Transfers Assistance Needed: Uses Rollator ADL's / Homemaking Assistance Needed: reported independence with ADL but on previous admission reported assistance from husband.             OT Problem List: Decreased strength;Decreased activity tolerance;Impaired balance (sitting and/or standing);Decreased safety awareness;Decreased knowledge of use of DME or AE;Decreased knowledge of precautions;Cardiopulmonary status limiting activity      OT Treatment/Interventions: Self-care/ADL training;Therapeutic exercise;Energy conservation;DME and/or AE instruction;Therapeutic activities;Patient/family education;Balance training    OT Goals(Current goals can be found in the care plan section) Acute Rehab OT Goals Patient Stated Goal: to get better and go home OT Goal Formulation: With patient Time For Goal Achievement: 04/21/17 Potential to Achieve Goals: Good ADL Goals Pt Will Perform Grooming: with set-up;sitting Pt Will Transfer to Toilet: with mod assist;stand pivot transfer;bedside commode Pt Will Perform Toileting - Clothing Manipulation and hygiene: with mod assist;sit to/from stand Additional ADL Goal #1: Pt will complete bed mobility in preparation for ADL seated at EOB with overall min assist. Additional ADL Goal #2: Pt will verbalize 3 strategies to  conserve energy during morning ADL routine.  OT Frequency: Min 2X/week   Barriers to D/C:            Co-evaluation              AM-PAC PT "6 Clicks" Daily Activity     Outcome Measure Help from another person eating meals?: A Little Help from another person taking care of personal grooming?: A Little Help from another person toileting, which includes using toliet, bedpan, or urinal?: Total Help from another person bathing (including washing, rinsing, drying)?: Total Help from another person to put on and taking off regular upper body clothing?: Total Help from another person to put on and taking off regular lower body clothing?: Total 6 Click Score: 10   End of Session Equipment Utilized During Treatment: Oxygen Nurse Communication: Mobility status  Activity Tolerance: Patient tolerated treatment well Patient left: in bed;with call bell/phone within reach;with bed alarm set  OT Visit Diagnosis: Other abnormalities of gait and mobility (R26.89);Muscle weakness (generalized) (M62.81)                Time: 1510-1530 OT Time Calculation (min): 20 min Charges:  OT General Charges $OT Visit: 1 Visit OT Evaluation $OT Eval Moderate  Complexity: 1 Mod G-Codes:     Norman Herrlich, MS OTR/L  Pager: [place] A Maitri Schnoebelen 04/07/2017, 4:15 PM

## 2017-04-07 NOTE — Progress Notes (Signed)
Progress Note  Patient Name: Destiny [name] Date of Encounter: 04/07/2017  Primary Cardiologist: Dr. Reine Just  Patient Profile     76 y.o. 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 amiodarone  200 mg Oral Daily  . atorvastatin  10 mg Oral q1800  . carvedilol  6.25 mg Oral BID WC  . darbepoetin (ARANESP) injection - DIALYSIS  60 mcg Intravenous Q Fri-HD  . escitalopram  10 mg Oral Daily  . feeding supplement (NEPRO CARB STEADY)  237 mL Oral BID BM  . gabapentin  100 mg Oral QHS  . Gerhardt's butt cream   Topical TID  . insulin aspart  0-9 Units Subcutaneous TID WC  . [START ON 04/08/2017] insulin NPH Human  10 Units Subcutaneous QAC breakfast  . ipratropium-albuterol  3 mL Nebulization Q6H  . levothyroxine  88 mcg Oral QAC breakfast  . loperamide  2 mg Oral Daily  . sodium chloride flush  3 mL Intravenous Q12H   Continuous Infusions: . sodium chloride    . ferric gluconate (FERRLECIT/NULECIT) IV 125 mg (04/06/17 1310)  . heparin 1,750 Units/hr (04/07/17 0956)   PRN Meds: sodium chloride, acetaminophen, dicyclomine, diphenoxylate-atropine, fluticasone, ipratropium-albuterol, LORazepam, midodrine, ondansetron (ZOFRAN) IV, sodium chloride, sodium chloride flush   Vital Signs    Vitals:   04/07/17 0111 04/07/17 0422 04/07/17 0532 04/07/17 0738  BP:   114/60 (!) 119/54  Pulse: 73 80 74 69  Resp: 16 17 17 19   Temp:   97.9 F (36.6 C)   TempSrc:   Axillary   SpO2: 94% 98% 95% 97%  Weight:   227 lb 4.7 oz (103.1 kg)   Height:        Intake/Output Summary (Last 24  hours) at 04/07/2017 1124 Last data filed at 04/07/2017 0547 Gross per 24 hour  Intake 388.95 ml  Output 2350 ml  Net -1961.05 ml   Filed Weights   04/06/17 0900 04/06/17 1232 04/07/17 0532  Weight: 235 lb 14.3 oz (107 kg) 231 lb 7.7 oz (105 kg) 227 lb 4.7 oz (103.1 kg)    Telemetry    V pacing  Can not discern A capture - Personally Reviewed  ECG    Personally reviewed  AV pacing spikes w V capture but cannot tell re Atrial capture  Compared to prev tracings where intracardiac signals confirm atrial activity no change   Physical Exam   Well developed and Morbidly obese with O2 in place Neck supple t Clear Regular rate and rhythm, no murmurs or gallops Abd-soft with active BS No Clubbing cyanosis 1+edema Skin-warm and dry A & Oriented  Grossly normal sensory and motor function    Labs    Chemistry Recent Labs  Lab 04/05/17 0310 04/06/17 0419 04/07/17 0332  NA 128* 130* 133*  K 5.1 4.1 3.9  CL 97* 99* 100*  CO2 21* 24 24  GLUCOSE 107* 173* 87  BUN 96* 70* 44*  CREATININE 3.65* 2.71* 2.24*  CALCIUM 8.4* 7.8* 7.7*  ALBUMIN 2.8* 2.3* 2.5*  GFRNONAA 11* 16* 20*  GFRAA  13* 19* 24*  ANIONGAP 10 7 9      Hematology Recent Labs  Lab 04/02/17 0610 04/03/17 0414 04/07/17 0332  WBC 2.7* 3.2* 6.9  RBC 3.62* 3.54* 3.79*  HGB 10.4* 10.2* 11.1*  HCT 32.6* 31.5* 33.9*  MCV 90.1 89.0 89.4  MCH 28.7 28.8 29.3  MCHC 31.9 32.4 32.7  RDW 14.8 14.9 15.1  PLT 85* 94* 85*    Cardiac Enzymes Recent Labs  Lab 04/05/17 1313 04/05/17 1435 04/05/17 2301  TROPONINI 0.03* 0.03* 0.04*   No results for input(s): TROPIPOC in the last 168 hours.   BNP No results for input(s): BNP, PROBNP in the last 168 hours.   DDimer No results for input(s): DDIMER in the last 168 hours.   Radiology    Ir Fluoro Guide Cv Line Left  Result Date: 04/05/2017 INDICATION: Chronic kidney disease and fluid overload. Patient needs urgent dialysis. Plan for non tunneled catheter  followed by a tunneled catheter when the patient's INR level has normalized. EXAM: FLUOROSCOPIC AND ULTRASOUND GUIDED PLACEMENT OF A NON-TUNNELED DIALYSIS CATHETER Physician: Stephan Minister. Henn, MD MEDICATIONS: None ANESTHESIA/SEDATION: None FLUOROSCOPY TIME:  Fluoroscopy Time: 24 seconds, 5 mGy COMPLICATIONS: None immediate. PROCEDURE: Informed consent was obtained for catheter placement. The patient was placed supine on the interventional table. Ultrasound confirmed a patent left internal jugular vein. Ultrasound images were obtained for documentation. The left side of the neck was prepped and draped in a sterile fashion. The left side of the neck was anesthetized with 1% lidocaine. Maximal barrier sterile technique was utilized including caps, mask, sterile gowns, sterile gloves, sterile drape, hand hygiene and skin antiseptic. A small incision was made with #11 blade scalpel. A 21 gauge needle directed into the left internal jugular vein with ultrasound guidance. A micropuncture dilator set was placed. A 20 cm Mahurkar catheter was selected. The catheter was advanced over a wire and positioned at the superior cavoatrial junction. Fluoroscopic images were obtained for documentation. Both dialysis lumens were found to aspirate and flush well. The proper amount of heparin was flushed in both lumens. The central venous lumen was flushed with normal saline. Catheter was sutured to skin. FINDINGS: Catheter tip at the superior cavoatrial junction. IMPRESSION: Successful placement of a left jugular non-tunneled dialysis catheter using ultrasound and fluoroscopic guidance. Electronically Signed   By: Markus Daft M.D.   On: 04/05/2017 15:04   Ir US Guide Vasc Access Left  Result Date: 04/05/2017 INDICATION: Chronic kidney disease and fluid overload. Patient needs urgent dialysis. Plan for non tunneled catheter followed by a tunneled catheter when the patient's INR level has normalized. EXAM: FLUOROSCOPIC AND ULTRASOUND  GUIDED PLACEMENT OF A NON-TUNNELED DIALYSIS CATHETER Physician: Stephan Minister. Henn, MD MEDICATIONS: None ANESTHESIA/SEDATION: None FLUOROSCOPY TIME:  Fluoroscopy Time: 24 seconds, 5 mGy COMPLICATIONS: None immediate. PROCEDURE: Informed consent was obtained for catheter placement. The patient was placed supine on the interventional table. Ultrasound confirmed a patent left internal jugular vein. Ultrasound images were obtained for documentation. The left side of the neck was prepped and draped in a sterile fashion. The left side of the neck was anesthetized with 1% lidocaine. Maximal barrier sterile technique was utilized including caps, mask, sterile gowns, sterile gloves, sterile drape, hand hygiene and skin antiseptic. A small incision was made with #11 blade scalpel. A 21 gauge needle directed into the left internal jugular vein with ultrasound guidance. A micropuncture dilator set was placed. A 20 cm Mahurkar catheter was selected. The catheter was advanced over a wire and  positioned at the superior cavoatrial junction. Fluoroscopic images were obtained for documentation. Both dialysis lumens were found to aspirate and flush well. The proper amount of heparin was flushed in both lumens. The central venous lumen was flushed with normal saline. Catheter was sutured to skin. FINDINGS: Catheter tip at the superior cavoatrial junction. IMPRESSION: Successful placement of a left jugular non-tunneled dialysis catheter using ultrasound and fluoroscopic guidance. Electronically Signed   By: Markus Daft M.D.   On: 04/05/2017 15:04    Cardiac Studies   none    Assessment & Plan    Congestive heart failure  A/C  Ischemic and nonischemic Cardiomyopathy  Biventricular ( RV was normal 4/15)   Aortic stenosis ( mild but artificially low 2/2 LV dysfunction ??)  Renal failure A/C  Morbidly obese  Atrial fibrillation paroxysmal   CRT St [employer] at [facility]  Thrombocytopenia    Still cannot be sure re Atrial capture   Will interrogate device in am  Volume status improved with dialysis  Will review with DB re ? Low gradient AS   Holding sinus  Reviewed with Dr Loleta Books re stopping heparin given chronic thrombocytopenia  Have low Plts  Ever been investigated      Signed, Virl Axe, MD  04/07/2017, 11:24 AM  [name], female   DOB: 09/18/1940, 76 y.o.   MRN: [id number]

## 2017-04-07 NOTE — Progress Notes (Addendum)
[facility] Rounding Note  Subjective/Interval History:  IR placed L IJ temp cath 11/23 S/p 2 HD treatments with net off of 3.5 1st TMT and 2L yesterday Currently sleeping soundly on BIPAP so I did not awaken  Objective Vital signs in last 24 hours: Vitals:   04/07/17 0111 04/07/17 0422 04/07/17 0532 04/07/17 0738  BP:   114/60 (!) 119/54  Pulse: 73 80 74 69  Resp: 16 17 17 19   Temp:   97.9 F (36.6 C)   TempSrc:   Axillary   SpO2: 94% 98% 95% 97%  Weight:   103.1 kg (227 lb 4.7 oz)   Height:       Weight change: -3.1 kg (-13.3 oz)  Intake/Output Summary (Last 24 hours) at 04/07/2017 0751 Last data filed at 04/07/2017 0547 Gross per 24 hour  Intake 388.95 ml  Output 2350 ml  Net -1961.05 ml   Physical Exam:  Blood pressure (!) 119/54, pulse 69, temperature 97.9 F (36.6 C), temperature source Axillary, resp. rate 19, height 5\' 4"  (1.626 m), weight 103.1 kg (227 lb 4.7 oz), SpO2 97 %. I will return to examine her - sleeping soundly with reports that was exhausted when finally got to sleep Exam yesterday Pale appearing WF, WOB less but still appears dyspneic Seen on hD L IJ temp cath in use for HD L IJ temp HD cath (IR 11/23) L [place] ICD non inflamed non tender Neck: + JVD to jaw angle Anteriorly a little clearer S1S2 No S3 Abdomen obese, large pannus, no focal tenderness 1+ pitting posterior thigh edema, trace r>L pretibial edema all unchanged Long scar healed RLE (old V harvest site) with edema of scar  Recent Labs  Lab 04/01/17 0508 04/02/17 0610 04/03/17 0414 04/04/17 0329 04/05/17 0310 04/06/17 0419 04/07/17 0332  NA 132* 130* 128* 127* 128* 130* 133*  K 4.9 5.1 5.1 5.1 5.1 4.1 3.9  CL 101 99* 97* 97* 97* 99* 100*  CO2 20* 22 22 22  21* 24 24  GLUCOSE 113* 205* 328* 394* 107* 173* 87  BUN 69* 74* 85* 92* 96* 70* 44*  CREATININE 3.60* 3.65* 3.64* 3.52* 3.65* 2.71* 2.24*  CALCIUM 8.6* 8.3* 8.3* 8.1* 8.4* 7.8* 7.7*  PHOS  --   --   --  4.5 4.5 4.2 3.1    Recent  Labs  Lab 04/05/17 0310 04/06/17 0419 04/07/17 0332  ALBUMIN 2.8* 2.3* 2.5*    Recent Labs  Lab 04/01/17 0508 04/02/17 0610 04/03/17 0414 04/07/17 0332  WBC 8.4 2.7* 3.2* 6.9  HGB 11.6* 10.4* 10.2* 11.1*  HCT 35.2* 32.6* 31.5* 33.9*  MCV 90.5 90.1 89.0 89.4  PLT 107* 85* 94* 85*   Iron/TIBC/Ferritin/ %Sat    Component Value Date/Time   IRON 23 (L) 04/05/2017 1718   TIBC 147 (L) 04/05/2017 1718   FERRITIN 225.2 12/18/2011 0750   IRONPCTSAT 16 04/05/2017 1718    Recent Labs  Lab 04/06/17 0729 04/06/17 1311 04/06/17 1647 04/06/17 1957 04/07/17 0737  GLUCAP 156* 115* 128* 91 96   Studies/Results: Ir Fluoro Guide Cv Line Left  Result Date: 04/05/2017 INDICATION: Chronic kidney disease and fluid overload. Patient needs urgent dialysis. Plan for non tunneled catheter followed by a tunneled catheter when the patient's INR level has normalized. EXAM: FLUOROSCOPIC AND ULTRASOUND GUIDED PLACEMENT OF A NON-TUNNELED DIALYSIS CATHETER Physician: Stephan Minister. Henn, MD MEDICATIONS: None ANESTHESIA/SEDATION: None FLUOROSCOPY TIME:  Fluoroscopy Time: 24 seconds, 5 mGy COMPLICATIONS: None immediate. PROCEDURE: Informed consent was obtained for catheter placement. The  patient was placed supine on the interventional table. Ultrasound confirmed a patent left internal jugular vein. Ultrasound images were obtained for documentation. The left side of the neck was prepped and draped in a sterile fashion. The left side of the neck was anesthetized with 1% lidocaine. Maximal barrier sterile technique was utilized including caps, mask, sterile gowns, sterile gloves, sterile drape, hand hygiene and skin antiseptic. A small incision was made with #11 blade scalpel. A 21 gauge needle directed into the left internal jugular vein with ultrasound guidance. A micropuncture dilator set was placed. A 20 cm Mahurkar catheter was selected. The catheter was advanced over a wire and positioned at the superior cavoatrial  junction. Fluoroscopic images were obtained for documentation. Both dialysis lumens were found to aspirate and flush well. The proper amount of heparin was flushed in both lumens. The central venous lumen was flushed with normal saline. Catheter was sutured to skin. FINDINGS: Catheter tip at the superior cavoatrial junction. IMPRESSION: Successful placement of a left jugular non-tunneled dialysis catheter using ultrasound and fluoroscopic guidance. Electronically Signed   By: Markus Daft M.D.   On: 04/05/2017 15:04   Ir US Guide Vasc Access Left  Result Date: 04/05/2017 INDICATION: Chronic kidney disease and fluid overload. Patient needs urgent dialysis. Plan for non tunneled catheter followed by a tunneled catheter when the patient's INR level has normalized. EXAM: FLUOROSCOPIC AND ULTRASOUND GUIDED PLACEMENT OF A NON-TUNNELED DIALYSIS CATHETER Physician: Stephan Minister. Henn, MD MEDICATIONS: None ANESTHESIA/SEDATION: None FLUOROSCOPY TIME:  Fluoroscopy Time: 24 seconds, 5 mGy COMPLICATIONS: None immediate. PROCEDURE: Informed consent was obtained for catheter placement. The patient was placed supine on the interventional table. Ultrasound confirmed a patent left internal jugular vein. Ultrasound images were obtained for documentation. The left side of the neck was prepped and draped in a sterile fashion. The left side of the neck was anesthetized with 1% lidocaine. Maximal barrier sterile technique was utilized including caps, mask, sterile gowns, sterile gloves, sterile drape, hand hygiene and skin antiseptic. A small incision was made with #11 blade scalpel. A 21 gauge needle directed into the left internal jugular vein with ultrasound guidance. A micropuncture dilator set was placed. A 20 cm Mahurkar catheter was selected. The catheter was advanced over a wire and positioned at the superior cavoatrial junction. Fluoroscopic images were obtained for documentation. Both dialysis lumens were found to aspirate and  flush well. The proper amount of heparin was flushed in both lumens. The central venous lumen was flushed with normal saline. Catheter was sutured to skin. FINDINGS: Catheter tip at the superior cavoatrial junction. IMPRESSION: Successful placement of a left jugular non-tunneled dialysis catheter using ultrasound and fluoroscopic guidance. Electronically Signed   By: Markus Daft M.D.   On: 04/05/2017 15:04   Medications: . sodium chloride    . ferric gluconate (FERRLECIT/NULECIT) IV 125 mg (04/06/17 1310)  . heparin 1,500 Units/hr (04/06/17 2333)   . amiodarone  200 mg Oral Daily  . atorvastatin  10 mg Oral q1800  . carvedilol  6.25 mg Oral BID WC  . darbepoetin (ARANESP) injection - DIALYSIS  60 mcg Intravenous Q Fri-HD  . escitalopram  10 mg Oral Daily  . feeding supplement (NEPRO CARB STEADY)  237 mL Oral BID BM  . gabapentin  100 mg Oral QHS  . Gerhardt's butt cream   Topical TID  . insulin aspart  0-15 Units Subcutaneous TID WC  . insulin aspart  0-5 Units Subcutaneous QHS  . insulin NPH Human  15 Units  Subcutaneous QAC breakfast  . ipratropium-albuterol  3 mL Nebulization Q6H  . levothyroxine  88 mcg Oral QAC breakfast  . loperamide  2 mg Oral Daily  . sodium chloride flush  3 mL Intravenous Q12H   Background: 76 y.o. year-old w/complicated PMH DM, MO, h/o non-Hodgkins's lymphoma, chronic systolic/diastolic HF with Right HF, PAF (chronic anticoagulation), OHS/OSA on NIMV, pacer/ICD, CAD prior CABG, HLD, anemia on Aranesp. Known advanced CKD Stage 5 (GFR baseline 12-13 at last [facility] labs 02/19/17) followed by Dr. Marval Regal. Multiple prior AKI on CKD usually cardiorenal. Admitted 03/30/17 w/ progressive SOB, bilateral pleural effusions on CXR + R PNA. R thoracentesis of 900 ml on 11/19.  Failed diuretics. HD initiated for new ESRD  1. CKD 5 w/volume overload and progressive dyspnea 1. Failed ceiling dose diuretics =  her tipping point to ESRD  2. INR too high for TDC so IR placed L IJ  temp cath 3. HD X 2 net of 5.5 liters off and weight down 6 kg  4. 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 Vein map ordered 6. Consult [facility] on Monday (will need plt support for AVF/AVG) (has left ICD so will need to be right side) 7. CLIP for outpt 8. Supporting BP with midodrine pre and mid 9. Reduced carvedilol to 6.25 BID in hopes of better BP for UF 10. Plan HD again on Monday  2. Systolic (EF 00-92) and diastolic HF with ECFV overload   1. CXR post thoracentesis improved aeration  2. S/p removal 5.5 L fluid  3. Hypervolemic hyponatremia - improving with HD  4. Thrombocytopenia plts chronically low  5. CKD-MBD - establish PTH status (pending since 11/23)  6. Acute on chronic hypoxic resp failure. Multifactorial.  1. Volume component as addressed above.  2. Some improvement post thoracentesis 3. Improving with HD  7. CAP  - completed 4 days rocephin and azithro, CXR 11/20 no infiltrate  8. Anemia  - 2/2 CKD. s/p Aranesp 60 (gets Q4weeks at [facility])  on 03/27/17 and recent dose Feraheme.  1. Started Aranesp with HD 2. Ferrlecit load for low TSat  9. Diabetes - per primary  10. Post prandial diarrhea - takes imodium at home. Ordered 11. H/o Non-Hodgkins lymphoma  Destiny Maes, MD [facility] [phone] pager 04/07/2017, 7:51 AM

## 2017-04-08 ENCOUNTER — Inpatient Hospital Stay (HOSPITAL_COMMUNITY): Payer: Medicare Other

## 2017-04-08 ENCOUNTER — Encounter (HOSPITAL_COMMUNITY): Payer: Self-pay | Admitting: Physician Assistant

## 2017-04-08 DIAGNOSIS — N186 End stage renal disease: Secondary | ICD-10-CM

## 2017-04-08 DIAGNOSIS — J449 Chronic obstructive pulmonary disease, unspecified: Secondary | ICD-10-CM

## 2017-04-08 DIAGNOSIS — Z992 Dependence on renal dialysis: Secondary | ICD-10-CM

## 2017-04-08 DIAGNOSIS — I509 Heart failure, unspecified: Secondary | ICD-10-CM

## 2017-04-08 LAB — CBC
HCT: 33.5 % — ABNORMAL LOW (ref 36.0–46.0)
Hemoglobin: 10.7 g/dL — ABNORMAL LOW (ref 12.0–15.0)
MCH: 28.9 pg (ref 26.0–34.0)
MCHC: 31.9 g/dL (ref 30.0–36.0)
MCV: 90.5 fL (ref 78.0–100.0)
PLATELETS: 96 10*3/uL — AB (ref 150–400)
RBC: 3.7 MIL/uL — AB (ref 3.87–5.11)
RDW: 15.5 % (ref 11.5–15.5)
WBC: 7.8 10*3/uL (ref 4.0–10.5)

## 2017-04-08 LAB — GLUCOSE, CAPILLARY
GLUCOSE-CAPILLARY: 145 mg/dL — AB (ref 65–99)
GLUCOSE-CAPILLARY: 145 mg/dL — AB (ref 65–99)
Glucose-Capillary: 152 mg/dL — ABNORMAL HIGH (ref 65–99)
Glucose-Capillary: 127 mg/dL — ABNORMAL HIGH (ref 65–99)

## 2017-04-08 MED ORDER — MIDODRINE HCL 5 MG PO TABS
ORAL_TABLET | ORAL | Status: AC
  Filled 2017-04-08: qty 2

## 2017-04-08 NOTE — NC FL2 (Signed)
[place] LEVEL OF CARE SCREENING TOOL     IDENTIFICATION  Patient Name: Destiny [name] Birthdate: 1941-02-22 Sex: female Admission Date (Current Location): 03/29/2017  [facility] and [place] Number:  Herbalist and Address:  [facility], [place] [address] [place], [place] [address]      Provider Number: [id number]  Attending Physician Name and Address:  Edwin Dada, *  Relative Name and Phone Number:  [name], spouse, [phone]    Current Level of Care: Hospital Recommended Level of Care: [place] Prior Approval Number:    Date Approved/Denied:   PASRR Number: [id number]  Discharge Plan: SNF    Current Diagnoses: Patient Active Problem List   Diagnosis Date Noted  . Pressure injury of skin 03/30/2017  . Pulmonary edema cardiac cause ([place]) 03/29/2017  . Community acquired pneumonia 03/29/2017  . Acute hypoxemic respiratory failure ([place]) 03/29/2017  . History of vertebral fracture 09/18/2016  . Closed compression fracture of L2 lumbar vertebra ([place])   . Thrombocytopenia ([place])   . Compression fracture of body of thoracic vertebra ([place]) 09/04/2016  . Back pain 09/04/2016  . Urinary incontinence 06/25/2016  . Diabetic neuropathy ([place]) 03/01/2016  . Current use of insulin ([place]) 03/01/2016  . CKD (chronic kidney disease) stage 4, GFR 15-29 ml/min (HCC) 03/01/2016  . Skin lesion of back 02/18/2016  . COPD, severe ([place]) 01/22/2016  . Obesity hypoventilation syndrome ([place]) 01/01/2016  . Debility 12/14/2015  . Chronic respiratory failure ([place]) 10/25/2014  . Medicare annual wellness visit, subsequent 09/12/2014  . Coagulopathy ([place]) 07/29/2014  . Morbid obesity ([place]) 07/29/2014  . Controlled type 2 diabetes mellitus with chronic kidney disease ([place]) 07/28/2014  . Anemia 12/13/2013  . DOE (dyspnea on exertion) 11/30/2013  . Edema 09/26/2013  . Hypercalcemia 09/26/2013  . Encounter for  therapeutic drug monitoring 06/10/2013  . Chronic systolic heart failure ([place]) 05/01/2013  . Acute on chronic combined systolic and diastolic CHF (congestive heart failure) ([employer]) 05/01/2013  . Physical deconditioning 03/25/2013  . Hyperkalemia 03/12/2013  . Hypothyroidism 03/06/2013  . Vitamin D deficiency 06/09/2012  . History of Cardiomyopathy, ischemic   . OSA (obstructive sleep apnea) 09/05/2010  . LEG PAIN, BILATERAL 12/28/2009  . Diabetes mellitus type 2 with retinopathy ([place]) 10/28/2009  . Paroxysmal atrial fibrillation ([place]) 06/21/2009  . History of non-Hodgkin's lymphoma 04/21/2009  . MURAL THROMBUS, LEFT VENTRICLE 03/07/2009  . LYMPHEDEMA 03/07/2009  . LBBB 08/26/2008  . History of CVA (cerebrovascular accident) 08/26/2008  . Backache 08/26/2008  . Automatic implantable cardioverter-defibrillator in situ 08/26/2008  . Acute cystitis without hematuria 05/17/2008  . Carotid stenosis 05/20/2007  . Hyperlipidemia 03/19/2007  . Essential hypertension 11/20/2006    Orientation RESPIRATION BLADDER Height & Weight     Self, Time, Situation, Place  O2, Other (Comment)(nasal cannula 3L/ bipap) Incontinent Weight: 223 lb 8.7 oz (101.4 kg) Height:  5\' 4"  (162.6 cm)  BEHAVIORAL SYMPTOMS/MOOD NEUROLOGICAL BOWEL NUTRITION STATUS      Incontinent Diet(renal/carb modified)  AMBULATORY STATUS COMMUNICATION OF NEEDS Skin   Total Care Verbally PU Stage and Appropriate Care(PU astage II, buttocks, foam dressing)                       Personal Care Assistance Level of Assistance  Bathing, Feeding, Dressing Bathing Assistance: Maximum assistance Feeding assistance: Limited assistance Dressing Assistance: Maximum assistance     Functional Limitations Info  Sight, Hearing, Speech Sight Info: Impaired(glasses) Hearing Info: Adequate  Speech Info: Adequate    SPECIAL CARE FACTORS FREQUENCY  PT (By licensed PT), OT (By licensed OT)     PT Frequency: 5x/week OT Frequency:  5x/week            Contractures Contractures Info: Not present    Additional Factors Info  Code Status, Allergies, Psychotropic, Insulin Sliding Scale Code Status Info: Full Allergies Info:  Sulfonamide Derivatives Psychotropic Info: lexapro Insulin Sliding Scale Info: insulin novolog 3x/day with meals, insulin humulin daily before breakfast       Current Medications (04/08/2017):  This is the current hospital active medication list Current Facility-Administered Medications  Medication Dose Route Frequency Provider Last Rate Last Dose  . 0.9 %  sodium chloride infusion  250 mL Intravenous PRN Manuella Ghazi, Pratik D, DO      . acetaminophen (TYLENOL) tablet 650 mg  650 mg Oral Q4H PRN Heath Lark D, DO   650 mg at 04/06/17 0936  . amiodarone (PACERONE) tablet 200 mg  200 mg Oral Daily Manuella Ghazi, Pratik D, DO   200 mg at 04/08/17 0947  . atorvastatin (LIPITOR) tablet 10 mg  10 mg Oral q1800 Manuella Ghazi, Pratik D, DO   10 mg at 04/07/17 1807  . carvedilol (COREG) tablet 6.25 mg  6.25 mg Oral BID WC Danford, Suann Larry, MD   6.25 mg at 04/07/17 1807  . Darbepoetin Alfa (ARANESP) injection 60 mcg  60 mcg Intravenous Q Ralene Ok, MD   60 mcg at 04/05/17 1700  . dicyclomine (BENTYL) tablet 20 mg  20 mg Oral BID PRN Manuella Ghazi, Pratik D, DO      . diphenoxylate-atropine (LOMOTIL) 2.5-0.025 MG per tablet 1 tablet  1 tablet Oral QID PRN Manuella Ghazi, Pratik D, DO      . escitalopram (LEXAPRO) tablet 10 mg  10 mg Oral Daily Manuella Ghazi, Pratik D, DO   10 mg at 04/08/17 0947  . feeding supplement (NEPRO CARB STEADY) liquid 237 mL  237 mL Oral BID BM Danford, Suann Larry, MD   237 mL at 04/07/17 1604  . ferric gluconate (NULECIT) 125 mg in sodium chloride 0.9 % 100 mL IVPB  125 mg Intravenous Daily Jamal Maes, MD   Stopped at 04/07/17 1329  . fluticasone (FLONASE) 50 MCG/ACT nasal spray 2 spray  2 spray Each Nare Daily PRN Manuella Ghazi, Pratik D, DO      . gabapentin (NEURONTIN) capsule 100 mg  100 mg Oral QHS Edwin Dada, MD   100 mg at 04/07/17 2020  . Gerhardt's butt cream   Topical TID Edwin Dada, MD      . insulin aspart (novoLOG) injection 0-9 Units  0-9 Units Subcutaneous TID WC Edwin Dada, MD   1 Units at 04/08/17 [phone]  . insulin NPH Human (HUMULIN N,NOVOLIN N) injection 10 Units  10 Units Subcutaneous QAC breakfast Danford, Suann Larry, MD      . ipratropium-albuterol (DUONEB) 0.5-2.5 (3) MG/3ML nebulizer solution 3 mL  3 mL Nebulization Q2H PRN Irene Pap N, DO   3 mL at 04/01/17 0916  . ipratropium-albuterol (DUONEB) 0.5-2.5 (3) MG/3ML nebulizer solution 3 mL  3 mL Nebulization Q6H Hall, Carole N, DO   3 mL at 04/08/17 0923  . levothyroxine (SYNTHROID, LEVOTHROID) tablet 88 mcg  88 mcg Oral QAC breakfast Heath Lark D, DO   88 mcg at 04/08/17 0947  . loperamide (IMODIUM) capsule 2 mg  2 mg Oral Daily Jamal Maes, MD   2 mg at 04/08/17 0947  .  LORazepam (ATIVAN) injection 0.5-1 mg  0.5-1 mg Intravenous Q6H PRN Opyd, Ilene Qua, MD   1 mg at 04/06/17 2352  . midodrine (PROAMATINE) tablet 10 mg  10 mg Oral PRN Jamal Maes, MD   10 mg at 04/08/17 0948  . ondansetron (ZOFRAN) injection 4 mg  4 mg Intravenous Q6H PRN Manuella Ghazi, Pratik D, DO      . sodium chloride (OCEAN) 0.65 % nasal spray 1 spray  1 spray Each Nare PRN Irene Pap N, DO   1 spray at 04/07/17 0844  . sodium chloride flush (NS) 0.9 % injection 3 mL  3 mL Intravenous Q12H Shah, Pratik D, DO   3 mL at 04/07/17 2021  . sodium chloride flush (NS) 0.9 % injection 3 mL  3 mL Intravenous PRN Heath Lark D, DO         Discharge Medications: Please see discharge summary for a list of discharge medications.  Relevant Imaging Results:  Relevant Lab Results:   Additional Information SSN: [id number]  Estanislado Emms, LCSW

## 2017-04-08 NOTE — Progress Notes (Signed)
Background: 76 y.o.year-old w/complicated PMH DM, MO, h/o non-Hodgkins's lymphoma, chronic systolic/diastolic HF with Right HF, PAF (chronic anticoagulation), OHS/OSA on NIMV, pacer/ICD, CAD prior CABG, HLD, anemia on Aranesp. Known advanced CKD Stage 5 (GFR baseline 12-13 at last [facility] labs 02/19/17) followed by Dr. Marval Regal. Multiple prior AKI on CKD usually cardiorenal. Admitted 03/30/17 w/ progressive SOB, bilateral pleural effusions on CXR + R PNA. R thoracentesis of 900 ml on 11/19.  Failed diuretics. HD initiated for new ESRD  1. New ESRD w/volume overload and progressive dyspnea 1. VVS/Dr Early has seen (may need plt support for AVF/AVG & has left ICD so will need to be right side).  Will remove more fluid and reeval 2. CLIP for outpt 3. Supporting BP with midodrine pre and mid 4. Plan HD again on Monday & TDC via IR  2. Systolic (EF 54-00) and diastolic HF with ECFV overload  1. CXR post thoracentesis improved aeration  3. Hypervolemic hyponatremia- improving with HD 4. Thrombocytopenia plts chronically low 5. CKD-MBD - establish PTH status (pending since 11/23) 6. Acute on chronic hypoxic resp failure. Multifactorial.  7. CAP- completed 4 days rocephin and azithro, CXR 11/20 no infiltrate 8. Anemia- 2/2 CKD. s/p Aranesp 60 (gets Q4weeks at [facility]) on 03/27/17 and recent dose Feraheme.  1. Started Aranesp with HD &Ferrlecit load for low TSat 9. Diabetes-  10. Post prandial diarrhea - takes imodium at home. 11. H/o Non-Hodgkins lymphoma  Subjective: Interval History: On BiPAP and v lethargic  Objective: Vital signs in last 24 hours: Temp:  [97.8 F (36.6 C)-99.4 F (37.4 C)] 99.4 F (37.4 C) (11/26 0513) Pulse Rate:  [49-73] 70 (11/26 0513) Resp:  [15-24] 16 (11/26 0513) BP: (96-132)/(48-80) 132/56 (11/26 0513) SpO2:  [91 %-100 %] 99 % (11/26 0513) FiO2 (%):  [40 %] 40 % (11/26 0513) Weight:  [101.4 kg (223 lb 8.7 oz)] 101.4 kg (223 lb 8.7 oz) (11/26  0513) Weight change: -5.6 kg (-5.5 oz)  Intake/Output from previous day: 11/25 0701 - 11/26 0700 In: 393.2 [P.O.:170; I.V.:113.2; IV Piggyback:110] Out: 50 [Urine:50] Intake/Output this shift: No intake/output data recorded.  General appearance: lethargic but arouses..baseline response according to husband Resp: diminished Cardio: distant Extremities: edema 2-3+ bilat UEs, tr bilat LEs  Lab Results: Recent Labs    04/07/17 0332  WBC 6.9  HGB 11.1*  HCT 33.9*  PLT 85*   BMET:  Recent Labs    04/06/17 0419 04/07/17 0332  NA 130* 133*  K 4.1 3.9  CL 99* 100*  CO2 24 24  GLUCOSE 173* 87  BUN 70* 44*  CREATININE 2.71* 2.24*  CALCIUM 7.8* 7.7*   No results for input(s): PTH in the last 72 hours. Iron Studies:  Recent Labs    04/05/17 1718  IRON 23*  TIBC 147*   Studies/Results: No results found.  Scheduled: . amiodarone  200 mg Oral Daily  . atorvastatin  10 mg Oral q1800  . carvedilol  6.25 mg Oral BID WC  . darbepoetin (ARANESP) injection - DIALYSIS  60 mcg Intravenous Q Fri-HD  . escitalopram  10 mg Oral Daily  . feeding supplement (NEPRO CARB STEADY)  237 mL Oral BID BM  . gabapentin  100 mg Oral QHS  . Gerhardt's butt cream   Topical TID  . insulin aspart  0-9 Units Subcutaneous TID WC  . insulin NPH Human  10 Units Subcutaneous QAC breakfast  . ipratropium-albuterol  3 mL Nebulization Q6H  . levothyroxine  88 mcg Oral QAC  breakfast  . loperamide  2 mg Oral Daily  . sodium chloride flush  3 mL Intravenous Q12H     LOS: 10 days   Estanislado Emms 04/08/2017,9:31 AM

## 2017-04-08 NOTE — Progress Notes (Signed)
PROGRESS NOTE    Destiny [name]  UEA:[id number] DOB: [birth year] DOA: 03/29/2017 PCP: Mosie Lukes, MD      Brief Narrative:  76 yo F CHF EF 20% with AICD, COPD on home oxygen of 2 L, CKD stage IV, anticoagulation with Coumadin with prior LV mural thrombus, and DM who presents with few days dyspnea.   Treated with 4 days ceftriaxone and azithromycin, no improvement.  Attempted diuresis but very little urine output, so started dialysis.     Assessment & Plan:  Principal Problem:   Acute on chronic combined systolic and diastolic CHF (congestive heart failure) (HCC) Active Problems:   Diabetes mellitus type 2 with retinopathy ([place])   Hyperlipidemia   Essential hypertension   Automatic implantable cardioverter-defibrillator in situ   Hypothyroidism   Anemia   Morbid obesity (HCC)   COPD, severe (HCC)   CKD (chronic kidney disease) stage 4, GFR 15-29 ml/min ([place])   Community acquired pneumonia   Acute hypoxemic respiratory failure (HCC)   Pressure injury of skin   Acute on chronic hypoxic respiratory failure from congestive heart failure, pleural effusion, ESRD Failed diuretics, elected to begin HD in consultation with Nephrology. Temp cath in [place] placed.   -HD per Nephrology -IR have been consulted for tunneled Cath, has ICD on left -[facility] have been consulted re: permanent vascular access, see their note for barriers   ESRD now on HD -HD per Nephrology -MBD w/u, CLIP, Aranesp per Nephrology  Acute on chronic systolic CHF -As above -Continue aspirin, statin, BB  Hyponatremia Improved -Daily BMP  Suspected Community Acquired pneumonia Completed course of antibiotics.  Pleural fluid with elevated LDH, low protein.  Meets Light's criteria, but suspect this is not parapneumonic.  Chronic COPD -Continue bronchodilators PRN  Diabetes -Continu NPH, decrease dose again -Continue SSI  Atrial fibrillation CHADS2Vasc 4 at least, but never stroke, LV thrombus years  ago.  On warfarin, amio. -Hold warfarin, for tunneled cath, restart after placement -Continue amiodarone, Coreg  Hypothyroidism -Continue levothyroxine  Other medications -Continue Bentyl -Continue Lexapro -Continue gabapentin, dose decreased now on HD  Elevated troponin Heart stretch in ESRD, doubt ACS  Thrombocytopenia Is chronic and unchanged over last 12 months.  In setting of NHL in remission, followed by Dr. Benay Spice.  No cirrhosis on last CT imaging.           DVT prophylaxis: N/A Code Status: FULL Family Communication: Husband Disposition Plan: HD, CLIP, VVS consult.  Home when CLIP process complete.      Consultants:   Nephrology, EP, IR  Procedures:   Thoracentesis  HD  Antimicrobials:   Ceftraixone and azithromycin, completed 4 days    Subjective: Appears listless and moribund, but states she feels better.  No new chest pain, fever, cough, sputum, confusion.  Objective: Vitals:   04/08/17 0138 04/08/17 0200 04/08/17 0300 04/08/17 0513  BP:    (!) 132/56  Pulse:  68 69 70  Resp:  15 16 16   Temp:    99.4 F (37.4 C)  TempSrc:    Axillary  SpO2: 100% 99% 98% 99%  Weight:    101.4 kg (223 lb 8.7 oz)  Height:        Intake/Output Summary (Last 24 hours) at 04/08/2017 1247 Last data filed at 04/08/2017 0814 Gross per 24 hour  Intake 283 ml  Output -  Net 283 ml   Filed Weights   04/06/17 1232 04/07/17 0532 04/08/17 0513  Weight: 105 kg (231 lb 7.7 oz) 103.1  kg (227 lb 4.7 oz) 101.4 kg (223 lb 8.7 oz)    Examination: General appearance: Elderly obese adult female, interactive, oriented just tired, wiped out.   HEENT: Anicteric, conjunctiva pink, lids and lashes normal. No more periorbital puffiness.  No nasal deformity, discharge, epistaxis.  Lips moist.   Skin: Warm and dry.  No jaundice.  No suspicious rashes or lesions.  Periorbital edema improved. Cardiac: RRR, nl S1-S2, SEM noted.  Capillary refill is brisk.  JVP not visible.   Trace LE edema.  Radia pulses 2+ and symmetric. Respiratory: Very labored breathing, appears tired.  Diminshed thoughout, no wheezes. Abdomen: Abdomen soft.  No TTP. No ascites, distension, hepatosplenomegaly.   MSK: No deformities or effusions. Neuro: Oriented, globally very weak.  Facies symmetric, moves upper exttremities equally. Psych: Affect flat.    Data Reviewed: I have personally reviewed following labs and imaging studies:  CBC: Recent Labs  Lab 04/02/17 0610 04/03/17 0414 04/07/17 0332  WBC 2.7* 3.2* 6.9  HGB 10.4* 10.2* 11.1*  HCT 32.6* 31.5* 33.9*  MCV 90.1 89.0 89.4  PLT 85* 94* 85*   Basic Metabolic Panel: Recent Labs  Lab 04/03/17 0414 04/04/17 0329 04/05/17 0310 04/06/17 0419 04/07/17 0332  NA 128* 127* 128* 130* 133*  K 5.1 5.1 5.1 4.1 3.9  CL 97* 97* 97* 99* 100*  CO2 22 22 21* 24 24  GLUCOSE 328* 394* 107* 173* 87  BUN 85* 92* 96* 70* 44*  CREATININE 3.64* 3.52* 3.65* 2.71* 2.24*  CALCIUM 8.3* 8.1* 8.4* 7.8* 7.7*  PHOS  --  4.5 4.5 4.2 3.1   GFR: Estimated Creatinine Clearance: 25.5 mL/min (A) (by C-G formula based on SCr of 2.24 mg/dL (H)). Liver Function Tests: Recent Labs  Lab 04/04/17 0329 04/05/17 0310 04/06/17 0419 04/07/17 0332  ALBUMIN 2.5* 2.8* 2.3* 2.5*   No results for input(s): LIPASE, AMYLASE in the last 168 hours. No results for input(s): AMMONIA in the last 168 hours. Coagulation Profile: Recent Labs  Lab 04/03/17 0414 04/04/17 0329 04/05/17 0310 04/06/17 0419 04/07/17 0332  INR 3.80 3.52 2.68 1.54 1.44   Cardiac Enzymes: Recent Labs  Lab 04/05/17 1313 04/05/17 1435 04/05/17 2301  TROPONINI 0.03* 0.03* 0.04*   BNP (last 3 results) No results for input(s): PROBNP in the last 8760 hours. HbA1C: No results for input(s): HGBA1C in the last 72 hours. CBG: Recent Labs  Lab 04/07/17 1136 04/07/17 1701 04/07/17 2233 04/08/17 0802 04/08/17 1107  GLUCAP 128* 128* 178* 145* 152*   Lipid Profile: No results  for input(s): CHOL, HDL, LDLCALC, TRIG, CHOLHDL, LDLDIRECT in the last 72 hours. Thyroid Function Tests: No results for input(s): TSH, T4TOTAL, FREET4, T3FREE, THYROIDAB in the last 72 hours. Anemia Panel: Recent Labs    04/05/17 1718  TIBC 147*  IRON 23*   Urine analysis:    Component Value Date/Time   COLORURINE YELLOW 03/29/2017 2348   APPEARANCEUR HAZY (A) 03/29/2017 2348   LABSPEC 1.013 03/29/2017 2348   PHURINE 5.0 03/29/2017 2348   GLUCOSEU NEGATIVE 03/29/2017 2348   GLUCOSEU NEGATIVE 06/25/2016 1002   HGBUR NEGATIVE 03/29/2017 2348   HGBUR large 05/17/2008 [place] 03/29/2017 [place] 03/29/2017 2348   PROTEINUR NEGATIVE 03/29/2017 2348   UROBILINOGEN 0.2 06/25/2016 1002   NITRITE NEGATIVE 03/29/2017 2348   LEUKOCYTESUR LARGE (A) 03/29/2017 2348   Sepsis Labs: @LABRCNTIP (procalcitonin:4,lacticidven:4)  ) Recent Results (from the past 240 hour(s))  Culture, blood (routine x 2) Call MD if unable to  obtain prior to antibiotics being given     Status: None   Collection Time: 03/29/17  3:45 PM  Result Value Ref Range Status   Specimen Description BLOOD LEFT ANTECUBITAL  Final   Special Requests   Final    BOTTLES DRAWN AEROBIC AND ANAEROBIC Blood Culture adequate volume   Culture NO GROWTH 5 DAYS  Final   Report Status 04/03/2017 FINAL  Final  Culture, blood (routine x 2) Call MD if unable to obtain prior to antibiotics being given     Status: None   Collection Time: 03/29/17  4:10 PM  Result Value Ref Range Status   Specimen Description BLOOD RIGHT ARM  Final   Special Requests   Final    BOTTLES DRAWN AEROBIC AND ANAEROBIC Blood Culture adequate volume   Culture NO GROWTH 5 DAYS  Final   Report Status 04/03/2017 FINAL  Final  MRSA PCR Screening     Status: None   Collection Time: 03/29/17  6:46 PM  Result Value Ref Range Status   MRSA by PCR NEGATIVE NEGATIVE Final    Comment:        The GeneXpert MRSA Assay (FDA approved  for NASAL specimens only), is one component of a comprehensive MRSA colonization surveillance program. It is not intended to diagnose MRSA infection nor to guide or monitor treatment for MRSA infections.   C difficile quick scan w PCR reflex     Status: None   Collection Time: 03/30/17 11:39 PM  Result Value Ref Range Status   C Diff antigen NEGATIVE NEGATIVE Final   C Diff toxin NEGATIVE NEGATIVE Final   C Diff interpretation No C. difficile detected.  Final  Giardia/Cryptosporidium EIA     Status: None   Collection Time: 03/30/17 11:39 PM  Result Value Ref Range Status   Giardia Ag, Stl Negative Negative Final   Cryptosporidium EIA Negative Negative Final    Comment: (NOTE) Performed At: [facility] [address] [place], [place] [id number] Rush Farmer MD OE:[id number]    Source of Sample STOOL  Final  Culture, body fluid-bottle     Status: None   Collection Time: 04/02/17 12:59 PM  Result Value Ref Range Status   Specimen Description FLUID RIGHT PLEURAL  Final   Special Requests NONE  Final   Culture NO GROWTH 5 DAYS  Final   Report Status 04/07/2017 FINAL  Final  Gram stain     Status: None   Collection Time: 04/02/17 12:59 PM  Result Value Ref Range Status   Specimen Description FLUID RIGHT PLEURAL  Final   Special Requests NONE  Final   Gram Stain NO WBC SEEN NO ORGANISMS SEEN   Final   Report Status 04/02/2017 FINAL  Final         Radiology Studies: No results found.      Scheduled Meds: . amiodarone  200 mg Oral Daily  . atorvastatin  10 mg Oral q1800  . carvedilol  6.25 mg Oral BID WC  . darbepoetin (ARANESP) injection - DIALYSIS  60 mcg Intravenous Q Fri-HD  . escitalopram  10 mg Oral Daily  . feeding supplement (NEPRO CARB STEADY)  237 mL Oral BID BM  . gabapentin  100 mg Oral QHS  . Gerhardt's butt cream   Topical TID  . insulin aspart  0-9 Units Subcutaneous TID WC  . insulin NPH Human  10 Units Subcutaneous QAC breakfast  .  ipratropium-albuterol  3 mL Nebulization Q6H  . levothyroxine  88  mcg Oral QAC breakfast  . loperamide  2 mg Oral Daily  . sodium chloride flush  3 mL Intravenous Q12H   Continuous Infusions: . sodium chloride    . ferric gluconate (FERRLECIT/NULECIT) IV Stopped (04/07/17 1329)     LOS: 10 days    Time spent: 25 minutes    Edwin Dada, MD [facility] Pager [phone]  If 7PM-7AM, please contact night-coverage [email or web address] Password [facility] 04/08/2017, 12:47 PM

## 2017-04-08 NOTE — H&P (Signed)
Chief Complaint: Patient was seen in consultation today for  Chief Complaint  Patient presents with  . Shortness of Breath    Referring Physician(s): Myrene Buddy  Supervising Physician: Markus Daft  Patient Status: [facility] - In-pt  History of Present Illness: Destiny [name] is a 76 y.o. 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  In the emergency department, she was noted to have a chest x-ray with bilateral pleural effusions as well as cardiomegaly suggestive of volume overload.    Additionally, the chest x-ray demonstrates a right-sided pneumonia for which she has also been started on IV azithromycin and Rocephin.  Lab work revealed creatinine 3.10 and potassium 5.6.  She underwent placement of a temporary HD catheter by Dr. Anselm Pancoast on 04/05/2017.  We are asked to know convert this to a tunneled HD catheter.  She has an  AICD on the left.  Anticoagulation has been held and INR is 1.4.  Past Medical History:  Diagnosis Date  . AICD (automatic cardioverter/defibrillator) present   . Anemia, unspecified 12/13/2013  . Back pain, chronic    "just when I walk; mass on 3rd and 4th vertebrae right lower back"  . Biventricular implantable cardiac defibrillator in situ 2007, 2012   a. 2007;  b. 2012 Gen change: SJM 3231-40 Uni BiV ICD, ser # [id number].  Marland Kitchen CAD (coronary artery disease)    a. 05/2001 CABG x4: LIMA->LAD, VG->D1, VG->D2, VG->RCA.  . Cardiomyopathy, ischemic    a. 2012 s/p SJM 3231-40 Uni BiV ICD, ser # [id number].  . Carotid stenosis    a. 10/19/2011 carotid duplex - Mild hard plaque bilaterally. Stable 40-59% bilateral ICA stenosis. Carotid US (04/2013):  Bilateral 40-59% ICA.  F/u 1 year  . Cerumen impaction 10/28/2015  .  Chronic combined systolic and diastolic CHF (congestive heart failure) (HCC)    a. EF as low as 25% in 2006;  b. EF 60-65% in 12/2011;  c. 02/2013 Echo: EF 35-40, mod mid-dist antsept HK, Gr 2 DD, mild LVH.  . CKD (chronic kidney disease), stage IV ([place])    hx/notes 09/04/2016  . COPD (chronic obstructive pulmonary disease) ([place])   . COPD, severe ([place]) 01/22/2016  . 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 Dyslipidemia   . Gout ~ 08/2011  . History of vertebral fracture 09/18/2016  . Hypercalcemia 09/26/2013  . Hypertension   . Hypothyroidism   . Hypoxemia requiring supplemental oxygen   . LBBB (left bundle branch block)   . Lumbar compression fracture ([place])    L2/notes 09/04/2016  . Morbid obesity with BMI of 45.0-49.9, adult (HCC)    Ht. 5'4". BMI 47.2  . Mural thrombus of left ventricle    before 2003, while on Coumadin, No h/o CVA  . 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 Benay Spice, in remission  . On home oxygen therapy    "normally 2-3L; 24/7" (12/14/2015); "2L; 24/7" (09/04/2016)  . OSA on CPAP   . PAF (paroxysmal atrial fibrillation) (HCC)    on Coumadin  . Presence of permanent cardiac pacemaker   . Retinopathy due to secondary diabetes ([place])    type II, uncontrolled  . Urinary incontinence 06/25/2016  .  Vertebral fracture, osteoporotic ([place]) 08/27/2016  . Vitamin D deficiency 06/09/2012   historical    Past Surgical History:  Procedure Laterality Date  . ABDOMINAL HYSTERECTOMY  1982  . CARDIAC CATHETERIZATION  06/03/01  . CARDIOVERSION N/A 07/30/2014   Procedure: CARDIOVERSION;  Surgeon: Jolaine Artist, MD;  Location: [facility] ENDOSCOPY;  Service: Cardiovascular;  Laterality: N/A;  . CARDIOVERSION N/A 11/25/2014   Procedure: CARDIOVERSION;  Surgeon: Evans Lance, MD;  Location: [place];  Service: Cardiovascular;  Laterality: N/A;  . CATARACT EXTRACTION     left eye    . CHOLECYSTECTOMY  1970  . CORONARY ARTERY BYPASS GRAFT  2003   CABG X4  . INSERT / REPLACE / REMOVE PACEMAKER  2007; 2012   w/AICD  . IR FLUORO GUIDE CV LINE LEFT  04/05/2017  . IR KYPHO LUMBAR INC FX REDUCE BONE BX UNI/BIL CANNULATION INC/IMAGING  09/11/2016  . IR THORACENTESIS ASP PLEURAL SPACE W/IMG GUIDE  04/02/2017  . IR US GUIDE VASC ACCESS LEFT  04/05/2017  . REFRACTIVE SURGERY     right eye  . TEE WITHOUT CARDIOVERSION N/A 12/16/2015   Procedure: TRANSESOPHAGEAL ECHOCARDIOGRAM (TEE);  Surgeon: Jolaine Artist, MD;  Location: [facility] ENDOSCOPY;  Service: Cardiovascular;  Laterality: N/A;  . TUBAL LIGATION  1972    Allergies: Sulfonamide derivatives  Medications: Prior to Admission medications   Medication Sig Start Date End Date Taking? Authorizing Provider  acetaminophen (TYLENOL) 500 MG tablet Take 1,000 mg every 6 (six) hours as needed by mouth for mild pain or headache.    Yes [provider]  albuterol (PROVENTIL HFA;VENTOLIN HFA) 108 (90 BASE) MCG/ACT inhaler Inhale 2 puffs into the lungs every 6 (six) hours as needed for wheezing or shortness of breath. 12/26/12  Yes Mosie Lukes, MD  amiodarone (PACERONE) 200 MG tablet Take 200 mg by mouth daily.   Yes [provider]  atorvastatin (LIPITOR) 10 MG tablet Take 1 tablet (10 mg total) by mouth daily. 02/15/17  Yes Bensimhon, Shaune Pascal, MD  carvedilol (COREG) 12.5 MG tablet Take 1 tablet (12.5 mg total) by mouth 2 (two) times daily. 02/15/17  Yes Bensimhon, Shaune Pascal, MD  dicyclomine (BENTYL) 20 MG tablet Take 1 tablet (20 mg total) by mouth 2 (two) times daily as needed for spasms. 09/28/16  Yes Julianne Rice, MD  diphenoxylate-atropine (LOMOTIL) 2.5-0.025 MG tablet Take 1 tablet 4 (four) times daily as needed by mouth for diarrhea or loose stools. 03/22/17  Yes Mosie Lukes, MD  escitalopram (LEXAPRO) 10 MG tablet TAKE 1 TABLET EVERY DAY Patient taking differently: Take 10 mg by mouth daily 11/15/16  Yes  Mosie Lukes, MD  fluticasone (FLONASE) 50 MCG/ACT nasal spray Place 2 sprays into both nostrils daily as needed for allergies or rhinitis. As needed for nasal stuffiness. Patient taking differently: Place 2 sprays daily as needed into both nostrils for allergies or rhinitis.  01/13/14  Yes Mosie Lukes, MD  gabapentin (NEURONTIN) 300 MG capsule Take 1 capsule (300 mg total) by mouth at bedtime. 01/29/17  Yes Mosie Lukes, MD  glucose blood (FREESTYLE LITE) test strip DX: 250.60  Check sugars bid and as needed 05/22/13  Yes Mosie Lukes, MD  insulin NPH (HUMULIN N,NOVOLIN N) 100 UNIT/ML injection Inject 15 Units daily before breakfast into the skin.    Yes [provider]  levothyroxine (SYNTHROID, LEVOTHROID) 88 MCG tablet Take 1 tablet (88 mcg total) by mouth daily. 02/01/17  Yes Mosie Lukes, MD  OXYGEN Inhale 3 L continuous into the lungs.    Yes [provider]  torsemide (DEMADEX) 20 MG tablet Take 20 mg See admin instructions by mouth. Friday, Saturday, Sunday take 40 mg Monday- Thursday ( 20 mg )   Yes [provider]  warfarin (COUMADIN) 5 MG tablet TAKE AS DIRECTED BY ANTICOAGULATION CLINIC. Patient taking differently: Take 5 mg by mouth daily on Monday. Take 2.5 mg by mouth daily on all other days 10/15/16  Yes Evans Lance, MD  metolazone (ZAROXOLYN) 2.5 MG tablet Take 1 tablet (2.5 mg total) by mouth as needed (for weight greater than 235 lb). Patient not taking: Reported on 03/28/2017 02/26/17   Bensimhon, Shaune Pascal, MD     Family History  Problem Relation Age of Onset  . Kidney cancer Mother        kidney and female repo - died @ 3  . Asthma Mother   . Cancer Mother        gyn and renal  . Heart disease Mother        CAD  . Heart disease Father        died @ 54  . Stroke Father   . Diabetes Father   . Hypertension Father   . Hyperlipidemia Father   . Heart attack Father   . Hypertension Son   . Kidney cancer Maternal Uncle   .  Cancer Maternal Uncle   . Cirrhosis Maternal Grandmother        non alcohol  . Cancer Maternal Grandfather   . Kidney cancer Maternal Grandfather   . Hypertension Maternal Aunt     Social History   Socioeconomic History  . Marital status: Married    Spouse name: None  . Number of children: Y  . Years of education: None  . Highest education level: None  Social Needs  . Financial resource strain: None  . Food insecurity - worry: None  . Food insecurity - inability: None  . Transportation needs - medical: None  . Transportation needs - non-medical: None  Occupational History  . 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   Tobacco Use  . Smoking status: Never Smoker  . Smokeless tobacco: Never Used  Substance and Sexual Activity  . Alcohol use: No  . Drug use: No  . Sexual activity: Not Currently    Birth control/protection: None    Comment: lives with husband, no major dietary restrictions  Other Topics Concern  . None  Social History Narrative   Lives in [place] with her husband.  She does not routinely exercise or adhere to any particular diet.      Review of Systems: A 12 point ROS discussed  Review of Systems  Constitutional: Positive for activity change.  HENT: Negative.   Respiratory: Negative.   Cardiovascular: Negative.   Gastrointestinal: Negative.   Genitourinary: Negative.   Musculoskeletal: Negative.   Skin: Negative.   Neurological: Negative.   Hematological: Negative.   Psychiatric/Behavioral: Negative.     Vital Signs: BP (!) 132/56 (BP Location: Right Arm) Comment: reviewed  Pulse 70   Temp 99.4 F (37.4 C) (Axillary)   Resp 16   Ht 5\' 4"  (1.626 m)   Wt 223 lb 8.7 oz (101.4 kg)   SpO2 99%   BMI 38.37 kg/m   Physical Exam  Constitutional:  Obese, NAD. Lethargic  HENT:  Head: Normocephalic and atraumatic.  Eyes: EOM are normal.  Neck: Normal range of motion.  Cardiovascular: Normal rate,  regular rhythm and normal heart sounds.  Pulmonary/Chest: Effort normal.  Diminished breath sounds bilaterally  Abdominal: Soft.  Skin: Skin is warm and dry.  Vitals reviewed.    Imaging: Dg Chest 1 View  Result Date: 04/02/2017 CLINICAL DATA:  Shortness of breath, post thoracentesis EXAM: CHEST 1 VIEW COMPARISON:  04/01/2017 FINDINGS: Small right pleural effusion.  No pneumothorax is seen. Left lung base is obscured. Cardiomegaly.  Mild interstitial edema. Left subclavian ICD. Median sternotomy. IMPRESSION: No pneumothorax is seen status post thoracentesis. Cardiomegaly with mild interstitial edema and small right pleural effusion. Electronically Signed   By: Julian Hy M.D.   On: 04/02/2017 13:03   Dg Chest 2 View  Result Date: 03/29/2017 CLINICAL DATA:  Shortness of Breath EXAM: CHEST  2 VIEW COMPARISON:  March 14, 2017 FINDINGS: There are pleural effusions bilaterally. There is patchy alveolar opacity in the right mid and lower lung zones. There is atelectasis in the left base. Heart is mildly enlarged with pulmonary vascularity within normal limits. Pacemaker leads are attached to the right atrium and middle cardiac vein. Patient is status post coronary artery bypass grafting. No adenopathy. No bone lesions. IMPRESSION: Mild cardiomegaly with pleural effusions bilaterally. Airspace opacity right mid lower lung zones, suspicious for pneumonia. Left base atelectasis. Pacemaker leads attached to right atrium and middle cardiac vein. Electronically Signed   By: Lowella Grip III M.D.   On: 03/29/2017 13:17   Dg Chest 2 View  Result Date: 03/14/2017 CLINICAL DATA:  New onset of redness and pain in the right lower extremity. No cardiopulmonary symptoms. History of coronary artery disease, CHF, COPD. EXAM: CHEST  2 VIEW COMPARISON:  PA and lateral chest x-ray of June 28, 2016 FINDINGS: The study was performed in a wheelchair. The lung volumes are low. The cardiac silhouette is  enlarged. The pulmonary vascularity is engorged. The interstitial markings are increased. I cannot exclude a small left pleural effusion. The ICD is in stable position. The sternal wires are intact. There is calcification in the wall of the thoracic aorta. There is multilevel degenerative disc disease of the thoracic spine. IMPRESSION: Findings consistent with mild CHF and interstitial edema. No acute pneumonia. Thoracic aortic atherosclerosis. Electronically Signed   By: David  Martinique M.D.   On: 03/14/2017 11:01   Ir Fluoro Guide Cv Line Left  Result Date: 04/05/2017 INDICATION: Chronic kidney disease and fluid overload. Patient needs urgent dialysis. Plan for non tunneled catheter followed by a tunneled catheter when the patient's INR level has normalized. EXAM: FLUOROSCOPIC AND ULTRASOUND GUIDED PLACEMENT OF A NON-TUNNELED DIALYSIS CATHETER Physician: Stephan Minister. Henn, MD MEDICATIONS: None ANESTHESIA/SEDATION: None FLUOROSCOPY TIME:  Fluoroscopy Time: 24 seconds, 5 mGy COMPLICATIONS: None immediate. PROCEDURE: Informed consent was obtained for catheter placement. The patient was placed supine on the interventional table. Ultrasound confirmed a patent left internal jugular vein. Ultrasound images were obtained for documentation. The left side of the neck was prepped and draped in a sterile fashion. The left side of the neck was anesthetized with 1% lidocaine. Maximal barrier sterile technique was utilized including caps, mask, sterile gowns, sterile gloves, sterile drape, hand hygiene and skin antiseptic. A small incision was made with #11 blade scalpel. A 21 gauge needle directed into the left internal jugular vein with ultrasound guidance. A micropuncture dilator set was placed. A 20 cm Mahurkar catheter was selected. The catheter was advanced over a wire and positioned at the superior cavoatrial junction. Fluoroscopic  images were obtained for documentation. Both dialysis lumens were found to aspirate and flush  well. The proper amount of heparin was flushed in both lumens. The central venous lumen was flushed with normal saline. Catheter was sutured to skin. FINDINGS: Catheter tip at the superior cavoatrial junction. IMPRESSION: Successful placement of a left jugular non-tunneled dialysis catheter using ultrasound and fluoroscopic guidance. Electronically Signed   By: Markus Daft M.D.   On: 04/05/2017 15:04   Ir US Guide Vasc Access Left  Result Date: 04/05/2017 INDICATION: Chronic kidney disease and fluid overload. Patient needs urgent dialysis. Plan for non tunneled catheter followed by a tunneled catheter when the patient's INR level has normalized. EXAM: FLUOROSCOPIC AND ULTRASOUND GUIDED PLACEMENT OF A NON-TUNNELED DIALYSIS CATHETER Physician: Stephan Minister. Henn, MD MEDICATIONS: None ANESTHESIA/SEDATION: None FLUOROSCOPY TIME:  Fluoroscopy Time: 24 seconds, 5 mGy COMPLICATIONS: None immediate. PROCEDURE: Informed consent was obtained for catheter placement. The patient was placed supine on the interventional table. Ultrasound confirmed a patent left internal jugular vein. Ultrasound images were obtained for documentation. The left side of the neck was prepped and draped in a sterile fashion. The left side of the neck was anesthetized with 1% lidocaine. Maximal barrier sterile technique was utilized including caps, mask, sterile gowns, sterile gloves, sterile drape, hand hygiene and skin antiseptic. A small incision was made with #11 blade scalpel. A 21 gauge needle directed into the left internal jugular vein with ultrasound guidance. A micropuncture dilator set was placed. A 20 cm Mahurkar catheter was selected. The catheter was advanced over a wire and positioned at the superior cavoatrial junction. Fluoroscopic images were obtained for documentation. Both dialysis lumens were found to aspirate and flush well. The proper amount of heparin was flushed in both lumens. The central venous lumen was flushed with normal  saline. Catheter was sutured to skin. FINDINGS: Catheter tip at the superior cavoatrial junction. IMPRESSION: Successful placement of a left jugular non-tunneled dialysis catheter using ultrasound and fluoroscopic guidance. Electronically Signed   By: Markus Daft M.D.   On: 04/05/2017 15:04   Dg Chest Port 1 View  Result Date: 04/05/2017 CLINICAL DATA:  Acute respiratory distress. EXAM: PORTABLE CHEST 1 VIEW COMPARISON:  04/02/2017 FINDINGS: Postoperative changes in the mediastinum. Cardiac pacemaker. Shallow inspiration. Cardiac enlargement. Diffuse pulmonary infiltrates, greater on the right. This likely represents edema although multifocal pneumonia could also have this appearance. Bilateral pleural effusions are probable. Progression since previous study. Calcified and tortuous aorta. Degenerative changes in the spine. Old right rib fractures. IMPRESSION: Cardiac enlargement with diffuse pulmonary infiltrates, likely edema, and bilateral pleural effusions with progression since previous study. Electronically Signed   By: Lucienne Capers M.D.   On: 04/05/2017 01:06   Dg Chest Port 1 View  Result Date: 04/01/2017 CLINICAL DATA:  Shortness of breath, history of COPD, non-Hodgkin's lymphoma, previous CVA, diabetes, nonsmoker. EXAM: PORTABLE CHEST 1 VIEW COMPARISON:  Chest x-ray of March 29, 2017 FINDINGS: The lungs are mildly hypoinflated. The interstitial markings are increased bilaterally. There is a small right pleural effusion. The pulmonary vascularity is engorged. The cardiac silhouette is enlarged. The patient has undergone previous CABG. The ICD is in stable position. There is calcification in the wall of the aortic arch. The observed bony thorax is unremarkable. IMPRESSION: CHF with mild to moderate interstitial edema. Small right pleural effusion. No definite pneumonia. Thoracic aortic atherosclerosis. Electronically Signed   By: David  Martinique M.D.   On: 04/01/2017 09:15   Ir Thoracentesis  Asp Pleural Space W/img  Guide  Result Date: 04/02/2017 INDICATION: Patient with right pleural effusion. Request is made for diagnostic and therapeutic right thoracentesis. EXAM: ULTRASOUND GUIDED DIAGNOSTIC AND THERAPEUTIC RIGHT THORACENTESIS MEDICATIONS: 10 mL 2% lidocaine COMPLICATIONS: None immediate. PROCEDURE: An ultrasound guided thoracentesis was thoroughly discussed with the patient and questions answered. The benefits, risks, alternatives and complications were also discussed. The patient understands and wishes to proceed with the procedure. Written consent was obtained. Ultrasound was performed to localize and mark an adequate pocket of fluid in the right chest. The area was then prepped and draped in the normal sterile fashion. 2% Lidocaine was used for local anesthesia. Under ultrasound guidance a Safe-T-Centesis catheter was introduced. Thoracentesis was performed. The catheter was removed and a dressing applied. FINDINGS: A total of approximately 900 mL of hazy, yellow fluid was removed. Samples were sent to the laboratory as requested by the clinical team. IMPRESSION: Successful ultrasound guided diagnostic and therapeutic right thoracentesis yielding 900 mL of pleural fluid. Read by:  Brynda Greathouse PA-C Electronically Signed   By: Aletta Edouard M.D.   On: 04/02/2017 16:13    Labs:  CBC: Recent Labs    04/01/17 0508 04/02/17 0610 04/03/17 0414 04/07/17 0332  WBC 8.4 2.7* 3.2* 6.9  HGB 11.6* 10.4* 10.2* 11.1*  HCT 35.2* 32.6* 31.5* 33.9*  PLT 107* 85* 94* 85*    COAGS: Recent Labs    09/04/16 1608  09/10/16 1055  04/04/17 0329 04/05/17 0310 04/06/17 0419 04/07/17 0332  INR 4.76*   < > 1.42   < > 3.52 2.68 1.54 1.44  APTT 102*  --  43*  --   --   --   --   --    < > = values in this interval not displayed.    BMP: Recent Labs    04/04/17 0329 04/05/17 0310 04/06/17 0419 04/07/17 0332  NA 127* 128* 130* 133*  K 5.1 5.1 4.1 3.9  CL 97* 97* 99* 100*  CO2 22  21* 24 24  GLUCOSE 394* 107* 173* 87  BUN 92* 96* 70* 44*  CALCIUM 8.1* 8.4* 7.8* 7.7*  CREATININE 3.52* 3.65* 2.71* 2.24*  GFRNONAA 12* 11* 16* 20*  GFRAA 14* 13* 19* 24*    LIVER FUNCTION TESTS: Recent Labs    09/28/16 1438 10/15/16 1037 01/31/17 1401 03/14/17 1012  04/04/17 0329 04/05/17 0310 04/06/17 0419 04/07/17 0332  BILITOT 1.3* 0.64 0.6 1.1  --   --   --   --   --   AST 31 30 19  33  --   --   --   --   --   ALT 14 14 10 18   --   --   --   --   --   ALKPHOS 124 112 78 92  --   --   --   --   --   PROT 6.0* 5.4* 5.7* 5.4*  --   --   --   --   --   ALBUMIN 3.0* 2.7* 3.2* 3.0*   < > 2.5* 2.8* 2.3* 2.5*   < > = values in this interval not displayed.    TUMOR MARKERS: No results for input(s): AFPTM, CEA, CA199, CHROMGRNA in the last 8760 hours.  Assessment and Plan:  CKD with fluid overload.  Now with need for hemodialysis.  Will convert temporary catheter to tunneled HD catheter tomorrow.  Risks and benefits discussed with the patient including, but not limited to bleeding, infection, vascular injury, pneumothorax which  may require chest tube placement, air embolism or even death.  All of the patient's questions were answered, patient is agreeable to proceed. Consent signed and in chart.  Thank you for this interesting consult.  I greatly enjoyed meeting Destiny [name] and look forward to participating in their care.  A copy of this report was sent to the requesting provider on this date.  Electronically Signed: Murrell Redden, PA-C 04/08/2017, 11:20 AM   I spent a total of 20 Minutes in face to face in clinical consultation, greater than 50% of which was counseling/coordinating care for tunneled HD catheter.

## 2017-04-08 NOTE — Care Management Important Message (Signed)
Important Message  Patient Details  Name: Destiny [name] MRN: [id number] Date of Birth: [birth year]   Medicare Important Message Given:  Yes    [name] May 04/08/2017, 10:47 AM

## 2017-04-08 NOTE — Progress Notes (Signed)
Took ferric gluconate infusion to HD to be given during treatment. I will continue to monitor the patient closely.  Saddie Benders RN

## 2017-04-08 NOTE — Progress Notes (Signed)
Progress Note  Patient Name: MARYLAND STELL Date of Encounter: 04/08/2017  Primary Cardiologist: Dr. Reine Just  Subjective   Remains somnolent. She was not this way last week. She has undergone HD twice.  Inpatient Medications    Scheduled Meds: . amiodarone  200 mg Oral Daily  . atorvastatin  10 mg Oral q1800  . carvedilol  6.25 mg Oral BID WC  . darbepoetin (ARANESP) injection - DIALYSIS  60 mcg Intravenous Q Fri-HD  . escitalopram  10 mg Oral Daily  . feeding supplement (NEPRO CARB STEADY)  237 mL Oral BID BM  . gabapentin  100 mg Oral QHS  . Gerhardt's butt cream   Topical TID  . insulin aspart  0-9 Units Subcutaneous TID WC  . insulin NPH Human  10 Units Subcutaneous QAC breakfast  . ipratropium-albuterol  3 mL Nebulization Q6H  . levothyroxine  88 mcg Oral QAC breakfast  . loperamide  2 mg Oral Daily  . sodium chloride flush  3 mL Intravenous Q12H   Continuous Infusions: . sodium chloride    . ferric gluconate (FERRLECIT/NULECIT) IV Stopped (04/07/17 1329)   PRN Meds: sodium chloride, acetaminophen, dicyclomine, diphenoxylate-atropine, fluticasone, ipratropium-albuterol, LORazepam, midodrine, ondansetron (ZOFRAN) IV, sodium chloride, sodium chloride flush   Vital Signs    Vitals:   04/08/17 0138 04/08/17 0200 04/08/17 0300 04/08/17 0513  BP:    (!) 132/56  Pulse:  68 69 70  Resp:  15 16 16   Temp:    99.4 F (37.4 C)  TempSrc:    Axillary  SpO2: 100% 99% 98% 99%  Weight:    223 lb 8.7 oz (101.4 kg)  Height:        Intake/Output Summary (Last 24 hours) at 04/08/2017 1152 Last data filed at 04/08/2017 0814 Gross per 24 hour  Intake 393.17 ml  Output -  Net 393.17 ml   Filed Weights   04/06/17 1232 04/07/17 0532 04/08/17 0513  Weight: 231 lb 7.7 oz (105 kg) 227 lb 4.7 oz (103.1 kg) 223 lb 8.7 oz (101.4 kg)    Telemetry    nsr with AV pacing - Personally Reviewed  ECG    NSR with AV pacing - Personally Reviewed  Physical Exam   GEN: No acute  distress but somnolent   Neck: 6 cm JVD Cardiac: RRR, no murmurs, rubs, or gallops.  Respiratory: Clear to auscultation bilaterally. GI: Soft, nontender, non-distended  MS: No edema; No deformity. Neuro:  Nonfocal  Psych: blunted.  Labs    Chemistry Recent Labs  Lab 04/05/17 0310 04/06/17 0419 04/07/17 0332  NA 128* 130* 133*  K 5.1 4.1 3.9  CL 97* 99* 100*  CO2 21* 24 24  GLUCOSE 107* 173* 87  BUN 96* 70* 44*  CREATININE 3.65* 2.71* 2.24*  CALCIUM 8.4* 7.8* 7.7*  ALBUMIN 2.8* 2.3* 2.5*  GFRNONAA 11* 16* 20*  GFRAA 13* 19* 24*  ANIONGAP 10 7 9      Hematology Recent Labs  Lab 04/02/17 0610 04/03/17 0414 04/07/17 0332  WBC 2.7* 3.2* 6.9  RBC 3.62* 3.54* 3.79*  HGB 10.4* 10.2* 11.1*  HCT 32.6* 31.5* 33.9*  MCV 90.1 89.0 89.4  MCH 28.7 28.8 29.3  MCHC 31.9 32.4 32.7  RDW 14.8 14.9 15.1  PLT 85* 94* 85*    Cardiac Enzymes Recent Labs  Lab 04/05/17 1313 04/05/17 1435 04/05/17 2301  TROPONINI 0.03* 0.03* 0.04*   No results for input(s): TROPIPOC in the last 168 hours.   BNPNo results for input(s):  BNP, PROBNP in the last 168 hours.   DDimer No results for input(s): DDIMER in the last 168 hours.   Radiology    No results found.  Cardiac Studies   none  Patient Profile     76 y.o. female admitted with dyspnea, failure to thrive, possible pneumonia now with ESRD, s/p HD, now somnolent  Assessment & Plan    1. Acute on chronic systolic/diastolic heart failure - she has undergone HD after IV diuretic therapy unsuccessful in removing enough fluid. Dr. Luther Parody note reviewed. 2. ICD - she is at [facility] but other issues have precluded our ability to change out her generator. Will follow. 3. PAF - she is maintaining NSR. I have reviewed her old ECG where we new that she was in NSR despite low atrial voltages. Will follow.   Branae Crail,M.D.  For questions or updates, please contact [place] Please consult [email or web address] for contact info under  Cardiology/STEMI.      Signed, Cristopher Peru, MD  04/08/2017, 11:52 AM  Patient ID: Daneil Dolin, female   DOB: 10-05-1940, 76 y.o.   MRN: [id number]

## 2017-04-08 NOTE — Consult Note (Signed)
[facility] [place]  Patient name: Destiny [name] MRN: [id number] DOB: 07-11-1940 Sex: female  REASON FOR CONSULT: Discussed access for hemodialysis  HPI: Destiny [name] is a 76 y.o. female, who is today for discussion of access for hemodialysis.  Patient is very lethargic with CPAP mask in place.  Her husband is present and I discussed this with him as well.  Patient has long history of renal insufficiency has been followed as an outpatient.  Presents now with a combination of congestive heart failure and renal failure.  Had temporary catheter placed by interventional radiology and had successful dialysis x2 via this catheter.  We are consulted for permanent access.  Interventional radiology has been consulted for conversion of her temporary catheter to a permanent catheter today.  Left side AICD and therefore would avoid left arm for access..  She is morbidly obese.  Has extensive past medical history as seen below.  Past Medical History:  Diagnosis Date  . AICD (automatic cardioverter/defibrillator) present   . Anemia, unspecified 12/13/2013  . Back pain, chronic    "just when I walk; mass on 3rd and 4th vertebrae right lower back"  . Biventricular implantable cardiac defibrillator in situ 2007, 2012   a. 2007;  b. 2012 Gen change: SJM 3231-40 Uni BiV ICD, ser # [id number].  Marland Kitchen CAD (coronary artery disease)    a. 05/2001 CABG x4: LIMA->LAD, VG->D1, VG->D2, VG->RCA.  . Cardiomyopathy, ischemic    a. 2012 s/p SJM 3231-40 Uni BiV ICD, ser # [id number].  . Carotid stenosis    a. 10/19/2011 carotid duplex - Mild hard plaque bilaterally. Stable 40-59% bilateral ICA stenosis. Carotid US (04/2013):  Bilateral 40-59% ICA.  F/u 1 year  . Cerumen impaction 10/28/2015  . Chronic combined systolic and diastolic CHF (congestive heart failure) (HCC)    a. EF as low as 25% in 2006;  b. EF 60-65% in 12/2011;  c. 02/2013 Echo: EF 35-40, mod mid-dist antsept HK,  Gr 2 DD, mild LVH.  . CKD (chronic kidney disease), stage IV ([place])    hx/notes 09/04/2016  . COPD (chronic obstructive pulmonary disease) ([place])   . COPD, severe ([place]) 01/22/2016  . 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 Dyslipidemia   . Gout ~ 08/2011  . History of vertebral fracture 09/18/2016  . Hypercalcemia 09/26/2013  . Hypertension   . Hypothyroidism   . Hypoxemia requiring supplemental oxygen   . LBBB (left bundle branch block)   . Lumbar compression fracture ([place])    L2/notes 09/04/2016  . Morbid obesity with BMI of 45.0-49.9, adult (HCC)    Ht. 5'4". BMI 47.2  . Mural thrombus of left ventricle    before 2003, while on Coumadin, No h/o CVA  . 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 Benay Spice, in remission  . On home oxygen therapy    "normally 2-3L; 24/7" (12/14/2015); "2L; 24/7" (09/04/2016)  . OSA on CPAP   . PAF (paroxysmal atrial fibrillation) (HCC)    on Coumadin  . Presence of permanent cardiac pacemaker   . Retinopathy due to secondary diabetes ([place])    type II, uncontrolled  . Urinary incontinence 06/25/2016  . Vertebral fracture, osteoporotic ([place]) 08/27/2016  . Vitamin D deficiency 06/09/2012   historical    Family History  Problem Relation Age of Onset  . Kidney cancer Mother  kidney and female repo - died @ 60  . Asthma Mother   . Cancer Mother        gyn and renal  . Heart disease Mother        CAD  . Heart disease Father        died @ 14  . Stroke Father   . Diabetes Father   . Hypertension Father   . Hyperlipidemia Father   . Heart attack Father   . Hypertension Son   . Kidney cancer Maternal Uncle   . Cancer Maternal Uncle   . Cirrhosis Maternal Grandmother        non alcohol  . Cancer Maternal Grandfather   . Kidney cancer Maternal Grandfather   . Hypertension Maternal Aunt     SOCIAL HISTORY: Social History   Socioeconomic  History  . Marital status: Married    Spouse name: Not on file  . Number of children: Y  . Years of education: Not on file  . Highest education level: Not on file  Social Needs  . Financial resource strain: Not on file  . Food insecurity - worry: Not on file  . Food insecurity - inability: Not on file  . Transportation needs - medical: Not on file  . Transportation needs - non-medical: Not on file  Occupational History  . 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   Tobacco Use  . Smoking status: Never Smoker  . Smokeless tobacco: Never Used  Substance and Sexual Activity  . Alcohol use: No  . Drug use: No  . Sexual activity: Not Currently    Birth control/protection: None    Comment: lives with husband, no major dietary restrictions  Other Topics Concern  . Not on file  Social History Narrative   Lives in [place] with her husband.  She does not routinely exercise or adhere to any particular diet.      Allergies  Allergen Reactions  . Sulfonamide Derivatives Other (See Comments)    Reaction unknown; "childhood allergy/mother"    Current Facility-Administered Medications  Medication Dose Route Frequency Provider Last Rate Last Dose  . 0.9 %  sodium chloride infusion  250 mL Intravenous PRN Manuella Ghazi, Pratik D, DO      . acetaminophen (TYLENOL) tablet 650 mg  650 mg Oral Q4H PRN Heath Lark D, DO   650 mg at 04/06/17 0936  . amiodarone (PACERONE) tablet 200 mg  200 mg Oral Daily Manuella Ghazi, Pratik D, DO   200 mg at 04/07/17 0839  . atorvastatin (LIPITOR) tablet 10 mg  10 mg Oral q1800 Manuella Ghazi, Pratik D, DO   10 mg at 04/07/17 1807  . carvedilol (COREG) tablet 6.25 mg  6.25 mg Oral BID WC Danford, Suann Larry, MD   6.25 mg at 04/07/17 1807  . Darbepoetin Alfa (ARANESP) injection 60 mcg  60 mcg Intravenous Q Ralene Ok, MD   60 mcg at 04/05/17 1700  . dicyclomine (BENTYL) tablet 20 mg  20 mg Oral BID PRN Manuella Ghazi, Pratik D, DO      .  diphenoxylate-atropine (LOMOTIL) 2.5-0.025 MG per tablet 1 tablet  1 tablet Oral QID PRN Manuella Ghazi, Pratik D, DO      . escitalopram (LEXAPRO) tablet 10 mg  10 mg Oral Daily Manuella Ghazi, Pratik D, DO   10 mg at 04/07/17 0839  . feeding supplement (NEPRO CARB STEADY) liquid 237 mL  237 mL Oral BID BM Myrene Buddy  P, MD   237 mL at 04/07/17 1604  . ferric gluconate (NULECIT) 125 mg in sodium chloride 0.9 % 100 mL IVPB  125 mg Intravenous Daily Jamal Maes, MD   Stopped at 04/07/17 1329  . fluticasone (FLONASE) 50 MCG/ACT nasal spray 2 spray  2 spray Each Nare Daily PRN Manuella Ghazi, Pratik D, DO      . gabapentin (NEURONTIN) capsule 100 mg  100 mg Oral QHS Edwin Dada, MD   100 mg at 04/07/17 2020  . Gerhardt's butt cream   Topical TID Edwin Dada, MD      . insulin aspart (novoLOG) injection 0-9 Units  0-9 Units Subcutaneous TID WC Edwin Dada, MD   1 Units at 04/07/17 1815  . insulin NPH Human (HUMULIN N,NOVOLIN N) injection 10 Units  10 Units Subcutaneous QAC breakfast Danford, Suann Larry, MD      . ipratropium-albuterol (DUONEB) 0.5-2.5 (3) MG/3ML nebulizer solution 3 mL  3 mL Nebulization Q2H PRN Irene Pap N, DO   3 mL at 04/01/17 0916  . ipratropium-albuterol (DUONEB) 0.5-2.5 (3) MG/3ML nebulizer solution 3 mL  3 mL Nebulization Q6H Hall, Carole N, DO   3 mL at 04/08/17 0138  . levothyroxine (SYNTHROID, LEVOTHROID) tablet 88 mcg  88 mcg Oral QAC breakfast Heath Lark D, DO   88 mcg at 04/07/17 0840  . loperamide (IMODIUM) capsule 2 mg  2 mg Oral Daily Jamal Maes, MD   2 mg at 04/07/17 0839  . LORazepam (ATIVAN) injection 0.5-1 mg  0.5-1 mg Intravenous Q6H PRN Opyd, Ilene Qua, MD   1 mg at 04/06/17 2352  . midodrine (PROAMATINE) tablet 10 mg  10 mg Oral PRN Jamal Maes, MD   10 mg at 04/06/17 0935  . ondansetron (ZOFRAN) injection 4 mg  4 mg Intravenous Q6H PRN Manuella Ghazi, Pratik D, DO      . sodium chloride (OCEAN) 0.65 % nasal spray 1 spray  1 spray Each Nare  PRN Irene Pap N, DO   1 spray at 04/07/17 0844  . sodium chloride flush (NS) 0.9 % injection 3 mL  3 mL Intravenous Q12H Shah, Pratik D, DO   3 mL at 04/07/17 2021  . sodium chloride flush (NS) 0.9 % injection 3 mL  3 mL Intravenous PRN Manuella Ghazi, Pratik D, DO        REVIEW OF SYSTEMS:  Reviewed in her history and physical with nothing to add  PHYSICAL EXAM: Vitals:   04/08/17 0138 04/08/17 0200 04/08/17 0300 04/08/17 0513  BP:    (!) 132/56  Pulse:  68 69 70  Resp:  15 16 16   Temp:    99.4 F (37.4 C)  TempSrc:    Axillary  SpO2: 100% 99% 98% 99%  Weight:    223 lb 8.7 oz (101.4 kg)  Height:        GENERAL: The patient is a well-nourished female, in no acute distress. The vital signs are documented above. CARDIOVASCULAR: She does not have a right radial pulse.  Does have a right ulnar pulse.  2+ right brachial pulse.  Very small surface veins bilaterally.  Extensive pitting edema in both upper extremities. PULMONARY: There is good air exchange  ABDOMEN: Soft and non-tender  MUSCULOSKELETAL: There are no major deformities or cyanosis. NEUROLOGIC: No focal weakness or paresthesias are detected. SKIN: There are no ulcers or rashes noted. PSYCHIATRIC: The patient has a normal affect.  DATA:  Vein mapping pending  MEDICAL ISSUES: Discussed with the  patient's husband.  Patient lethargic and unable to participate in the discussion.  I do not feel that she is appropriate level of optimization for elective access.  Discussed this with Dr. Florene Glen as well with [facility]  Would recommend proceeding with tunneled catheter with interventional radiology as planned.  Will follow and plan eventual right arm access when she has had improvement in her edema.  We will review vein mapping but feels it is very unlikely that she would be a fistula candidate due to her morbid obesity.  In all likelihood will require a right upper arm AV graft.  Will make further recommendations pending  vein map.  Of note she does have chronically low platelets and this may need to be addressed around the time of her surgery.   Rosetta Posner, MD FACS [facility] Tel [phone] Pager [phone]

## 2017-04-08 NOTE — Progress Notes (Signed)
Right  Upper Extremity Vein Map    Cephalic  Segment Diameter Depth Comment  1. Axilla 65mm 69mm   2. Mid upper arm 4.71mm 6.1mm   3. Above AC 52mm 8.62mm   4. In [facility] 5.14mm 50mm   5. Below AC 5.56mm 42mm   6. Mid forearm 3.61mm 65mm branch  7. Wrist 4.58mm 7.73mm    mm mm    mm mm    mm mm    Basilic  Segment Diameter Depth Comment  1. Axilla 6.8mm 22mm   2. Mid upper arm 5.53mm 62mm   3. Above [facility] 5.14mm 29mm branch  4. In [facility] 3.50mm 6.57mm   5. Below AC 3.45mm 43mm branch  6. Mid forearm 4.35mm 61mm   7. Wrist 3.42mm 53mm    mm mm    mm mm    mm mm    Left Upper Extremity Vein Map    Cephalic  Segment Diameter Depth Comment  1. Axilla 4.7mm 30mm   2. Mid upper arm 4.30mm 28mm   3. Above AC 4.15mm mm   4. In [facility] 6.57mm 1mm branch  5. Below AC 3.69mm 3.74mm   6. Mid forearm 19mm 49mm   7. Wrist mm mm bandages   mm mm    mm mm    mm mm    Basilic  Segment Diameter Depth Comment  1. Axilla 89mm 23mm   2. Mid upper arm 4.83mm 78mm branch  3. Above AC 4.27mm 8.41mm   4. In [facility] 4.62mm 76mm   5. Below AC 5.60mm 47mm   6. Mid forearm 5.45mm 59mm   7. Wrist mm mm bandages   mm mm    mm mm    mm mm

## 2017-04-08 NOTE — Clinical Social Work Note (Signed)
Clinical Social Work Assessment  Patient Details  Name: Destiny [name] MRN: [id number] Date of Birth: [birth year]  Date of referral:  04/08/17               Reason for consult:  Discharge Planning, Facility Placement                Permission sought to share information with:  Facility Sport and exercise psychologist, Family Supports Permission granted to share information::  No(patient did not respond verbally to [place] but appeared alert, spouse at bedside)  Name::     [name]  Agency::  SNFs  Relationship::  spouse  Contact Information:  [phone]  Housing/Transportation Living arrangements for the past 2 months:  Single Family Home Source of Information:  Spouse Patient Interpreter Needed:  None Criminal Activity/Legal Involvement Pertinent to Current Situation/Hospitalization:  No - Comment as needed Significant Relationships:  Spouse Lives with:  Spouse Do you feel safe going back to the place where you live?  Yes Need for family participation in patient care:  Yes (Comment)  Care giving concerns: Patient from home with spouse.    Social Worker assessment / plan: CSW met with patient and spouse at bedside. Patient did not respond verbally to [place] but appeared alert. Spouse had questions about SNF v CIR, as patient was previously in CIR several years ago. CSW discussed PT recommendations and SNF referral process, as well as insurance coverage for SNF. Spouse agreeable to SNF and prefers [facility]. CSW sent out initial referrals and will follow for discharge planning. Note patient will need HD clip if in need of HD outpatient, and will need placement at SNF that can provide transport to HD.   Employment status:  Retired Forensic scientist:  [employer] PT Recommendations:  [place] / Referral to community resources:  [place]  Patient/Family's Response to care: Spouse appreciative of care.  Patient/Family's Understanding of and Emotional  Response to Diagnosis, Current Treatment, and Prognosis: Spouse with understanding of patient's condition and agreeable to SNF placement.  Emotional Assessment Appearance:  Appears stated age Attitude/Demeanor/Rapport:  Unable to Assess Affect (typically observed):  Unable to Assess Orientation:  Oriented to Self, Oriented to Place, Oriented to  Time, Oriented to Situation Alcohol / Substance use:  Not Applicable Psych involvement (Current and /or in the community):  No (Comment)  Discharge Needs  Concerns to be addressed:  Discharge Planning Concerns Readmission within the last 30 days:  No Current discharge risk:  Physical Impairment Barriers to Discharge:  Continued Medical Work up   Estanislado Emms, LCSW 04/08/2017, 3:08 PM

## 2017-04-09 ENCOUNTER — Inpatient Hospital Stay (HOSPITAL_COMMUNITY): Payer: Medicare Other

## 2017-04-09 ENCOUNTER — Telehealth: Payer: Self-pay

## 2017-04-09 LAB — GLUCOSE, CAPILLARY
GLUCOSE-CAPILLARY: 116 mg/dL — AB (ref 65–99)
Glucose-Capillary: 104 mg/dL — ABNORMAL HIGH (ref 65–99)
Glucose-Capillary: 129 mg/dL — ABNORMAL HIGH (ref 65–99)
Glucose-Capillary: 132 mg/dL — ABNORMAL HIGH (ref 65–99)

## 2017-04-09 LAB — CBC
HEMATOCRIT: 32.9 % — AB (ref 36.0–46.0)
HEMOGLOBIN: 10.3 g/dL — AB (ref 12.0–15.0)
MCH: 28.9 pg (ref 26.0–34.0)
MCHC: 31.3 g/dL (ref 30.0–36.0)
MCV: 92.2 fL (ref 78.0–100.0)
PLATELETS: 63 10*3/uL — AB (ref 150–400)
RBC: 3.57 MIL/uL — AB (ref 3.87–5.11)
RDW: 15.6 % — AB (ref 11.5–15.5)
WBC: 7.1 10*3/uL (ref 4.0–10.5)

## 2017-04-09 LAB — RENAL FUNCTION PANEL
ANION GAP: 7 (ref 5–15)
Albumin: 2.5 g/dL — ABNORMAL LOW (ref 3.5–5.0)
Albumin: 2.8 g/dL — ABNORMAL LOW (ref 3.5–5.0)
Anion gap: 7 (ref 5–15)
BUN: 33 mg/dL — AB (ref 6–20)
BUN: 44 mg/dL — ABNORMAL HIGH (ref 6–20)
CALCIUM: 8.2 mg/dL — AB (ref 8.9–10.3)
CHLORIDE: 99 mmol/L — AB (ref 101–111)
CO2: 27 mmol/L (ref 22–32)
CO2: 28 mmol/L (ref 22–32)
CREATININE: 2.04 mg/dL — AB (ref 0.44–1.00)
Calcium: 7.8 mg/dL — ABNORMAL LOW (ref 8.9–10.3)
Chloride: 99 mmol/L — ABNORMAL LOW (ref 101–111)
Creatinine, Ser: 2.7 mg/dL — ABNORMAL HIGH (ref 0.44–1.00)
GFR calc non Af Amer: 16 mL/min — ABNORMAL LOW (ref >60)
GFR calc non Af Amer: 23 mL/min — ABNORMAL LOW (ref >60)
GFR, EST AFRICAN AMERICAN: 19 mL/min — AB (ref >60)
GFR, EST AFRICAN AMERICAN: 26 mL/min — AB (ref >60)
GLUCOSE: 110 mg/dL — AB (ref 65–99)
GLUCOSE: 131 mg/dL — AB (ref 65–99)
PHOSPHORUS: 3.8 mg/dL (ref 2.5–4.6)
POTASSIUM: 4.3 mmol/L (ref 3.5–5.1)
Phosphorus: 2.6 mg/dL (ref 2.5–4.6)
Potassium: 4.2 mmol/L (ref 3.5–5.1)
SODIUM: 133 mmol/L — AB (ref 135–145)
SODIUM: 134 mmol/L — AB (ref 135–145)

## 2017-04-09 LAB — PARATHYROID HORMONE, INTACT (NO CA): PTH: 109 pg/mL — ABNORMAL HIGH (ref 15–65)

## 2017-04-09 MED ORDER — ALTEPLASE 2 MG IJ SOLR
2.0000 mg | Freq: Once | INTRAMUSCULAR | Status: AC
  Administered 2017-04-09: 2 mg

## 2017-04-09 MED ORDER — CEFAZOLIN SODIUM-DEXTROSE 2-4 GM/100ML-% IV SOLN
2.0000 g | INTRAVENOUS | Status: AC
  Administered 2017-04-10: 2 g via INTRAVENOUS

## 2017-04-09 MED ORDER — LIDOCAINE HCL 1 % IJ SOLN
INTRAMUSCULAR | Status: AC
  Filled 2017-04-09: qty 20

## 2017-04-09 MED ORDER — HEPARIN SODIUM (PORCINE) 1000 UNIT/ML IJ SOLN
INTRAMUSCULAR | Status: AC
  Filled 2017-04-09: qty 1

## 2017-04-09 MED ORDER — MIDODRINE HCL 5 MG PO TABS
ORAL_TABLET | ORAL | Status: AC
  Administered 2017-04-13: 10 mg via ORAL
  Filled 2017-04-09: qty 2

## 2017-04-09 MED ORDER — ALTEPLASE 2 MG IJ SOLR
INTRAMUSCULAR | Status: AC
  Filled 2017-04-09: qty 2

## 2017-04-09 MED ORDER — ALTEPLASE 2 MG IJ SOLR
INTRAMUSCULAR | Status: AC
  Administered 2017-04-09: 2 mg
  Filled 2017-04-09: qty 2

## 2017-04-09 NOTE — Progress Notes (Signed)
Subjective: Interval History: none.  Successful hemodialysis.  The left IJ temporary catheter yesterday.  Plan for tunneled catheter with interventional radiology today.  Objective: Vital signs in last 24 hours: Temp:  [97.7 F (36.5 C)-98.3 F (36.8 C)] 97.8 F (36.6 C) (11/27 0624) Pulse Rate:  [70-76] 72 (11/27 0624) Resp:  [17-21] 18 (11/27 0624) BP: (107-151)/(45-70) 107/54 (11/27 0624) SpO2:  [88 %-99 %] 97 % (11/27 0813) FiO2 (%):  [40 %] 40 % (11/27 0813) Weight:  [221 lb 12.5 oz (100.6 kg)-226 lb 3.1 oz (102.6 kg)] 222 lb 10.6 oz (101 kg) (11/27 0347)  Intake/Output from previous day: 11/26 0701 - 11/27 0700 In: 300 [P.O.:300] Out: 2000  Intake/Output this shift: No intake/output data recorded.  Edema in her right arm today.  Has improved.  Lab Results: Recent Labs    04/07/17 0332 04/08/17 2302  WBC 6.9 7.8  HGB 11.1* 10.7*  HCT 33.9* 33.5*  PLT 85* 96*   BMET Recent Labs    04/07/17 0332 04/09/17 0004  NA 133* 134*  K 3.9 4.2  CL 100* 99*  CO2 24 28  GLUCOSE 87 110*  BUN 44* 33*  CREATININE 2.24* 2.04*  CALCIUM 7.7* 7.8*    Studies/Results: Dg Chest 1 View  Result Date: 04/02/2017 CLINICAL DATA:  Shortness of breath, post thoracentesis EXAM: CHEST 1 VIEW COMPARISON:  04/01/2017 FINDINGS: Small right pleural effusion.  No pneumothorax is seen. Left lung base is obscured. Cardiomegaly.  Mild interstitial edema. Left subclavian ICD. Median sternotomy. IMPRESSION: No pneumothorax is seen status post thoracentesis. Cardiomegaly with mild interstitial edema and small right pleural effusion. Electronically Signed   By: Julian Hy M.D.   On: 04/02/2017 13:03   Dg Chest 2 View  Result Date: 03/29/2017 CLINICAL DATA:  Shortness of Breath EXAM: CHEST  2 VIEW COMPARISON:  March 14, 2017 FINDINGS: There are pleural effusions bilaterally. There is patchy alveolar opacity in the right mid and lower lung zones. There is atelectasis in the left base.  Heart is mildly enlarged with pulmonary vascularity within normal limits. Pacemaker leads are attached to the right atrium and middle cardiac vein. Patient is status post coronary artery bypass grafting. No adenopathy. No bone lesions. IMPRESSION: Mild cardiomegaly with pleural effusions bilaterally. Airspace opacity right mid lower lung zones, suspicious for pneumonia. Left base atelectasis. Pacemaker leads attached to right atrium and middle cardiac vein. Electronically Signed   By: Lowella Grip III M.D.   On: 03/29/2017 13:17   Dg Chest 2 View  Result Date: 03/14/2017 CLINICAL DATA:  New onset of redness and pain in the right lower extremity. No cardiopulmonary symptoms. History of coronary artery disease, CHF, COPD. EXAM: CHEST  2 VIEW COMPARISON:  PA and lateral chest x-ray of June 28, 2016 FINDINGS: The study was performed in a wheelchair. The lung volumes are low. The cardiac silhouette is enlarged. The pulmonary vascularity is engorged. The interstitial markings are increased. I cannot exclude a small left pleural effusion. The ICD is in stable position. The sternal wires are intact. There is calcification in the wall of the thoracic aorta. There is multilevel degenerative disc disease of the thoracic spine. IMPRESSION: Findings consistent with mild CHF and interstitial edema. No acute pneumonia. Thoracic aortic atherosclerosis. Electronically Signed   By: David  Martinique M.D.   On: 03/14/2017 11:01   Ir Fluoro Guide Cv Line Left  Result Date: 04/05/2017 INDICATION: Chronic kidney disease and fluid overload. Patient needs urgent dialysis. Plan for non tunneled catheter followed  by a tunneled catheter when the patient's INR level has normalized. EXAM: FLUOROSCOPIC AND ULTRASOUND GUIDED PLACEMENT OF A NON-TUNNELED DIALYSIS CATHETER Physician: Stephan Minister. Henn, MD MEDICATIONS: None ANESTHESIA/SEDATION: None FLUOROSCOPY TIME:  Fluoroscopy Time: 24 seconds, 5 mGy COMPLICATIONS: None immediate.  PROCEDURE: Informed consent was obtained for catheter placement. The patient was placed supine on the interventional table. Ultrasound confirmed a patent left internal jugular vein. Ultrasound images were obtained for documentation. The left side of the neck was prepped and draped in a sterile fashion. The left side of the neck was anesthetized with 1% lidocaine. Maximal barrier sterile technique was utilized including caps, mask, sterile gowns, sterile gloves, sterile drape, hand hygiene and skin antiseptic. A small incision was made with #11 blade scalpel. A 21 gauge needle directed into the left internal jugular vein with ultrasound guidance. A micropuncture dilator set was placed. A 20 cm Mahurkar catheter was selected. The catheter was advanced over a wire and positioned at the superior cavoatrial junction. Fluoroscopic images were obtained for documentation. Both dialysis lumens were found to aspirate and flush well. The proper amount of heparin was flushed in both lumens. The central venous lumen was flushed with normal saline. Catheter was sutured to skin. FINDINGS: Catheter tip at the superior cavoatrial junction. IMPRESSION: Successful placement of a left jugular non-tunneled dialysis catheter using ultrasound and fluoroscopic guidance. Electronically Signed   By: Markus Daft M.D.   On: 04/05/2017 15:04   Ir US Guide Vasc Access Left  Result Date: 04/05/2017 INDICATION: Chronic kidney disease and fluid overload. Patient needs urgent dialysis. Plan for non tunneled catheter followed by a tunneled catheter when the patient's INR level has normalized. EXAM: FLUOROSCOPIC AND ULTRASOUND GUIDED PLACEMENT OF A NON-TUNNELED DIALYSIS CATHETER Physician: Stephan Minister. Henn, MD MEDICATIONS: None ANESTHESIA/SEDATION: None FLUOROSCOPY TIME:  Fluoroscopy Time: 24 seconds, 5 mGy COMPLICATIONS: None immediate. PROCEDURE: Informed consent was obtained for catheter placement. The patient was placed supine on the  interventional table. Ultrasound confirmed a patent left internal jugular vein. Ultrasound images were obtained for documentation. The left side of the neck was prepped and draped in a sterile fashion. The left side of the neck was anesthetized with 1% lidocaine. Maximal barrier sterile technique was utilized including caps, mask, sterile gowns, sterile gloves, sterile drape, hand hygiene and skin antiseptic. A small incision was made with #11 blade scalpel. A 21 gauge needle directed into the left internal jugular vein with ultrasound guidance. A micropuncture dilator set was placed. A 20 cm Mahurkar catheter was selected. The catheter was advanced over a wire and positioned at the superior cavoatrial junction. Fluoroscopic images were obtained for documentation. Both dialysis lumens were found to aspirate and flush well. The proper amount of heparin was flushed in both lumens. The central venous lumen was flushed with normal saline. Catheter was sutured to skin. FINDINGS: Catheter tip at the superior cavoatrial junction. IMPRESSION: Successful placement of a left jugular non-tunneled dialysis catheter using ultrasound and fluoroscopic guidance. Electronically Signed   By: Markus Daft M.D.   On: 04/05/2017 15:04   Dg Chest Port 1 View  Result Date: 04/05/2017 CLINICAL DATA:  Acute respiratory distress. EXAM: PORTABLE CHEST 1 VIEW COMPARISON:  04/02/2017 FINDINGS: Postoperative changes in the mediastinum. Cardiac pacemaker. Shallow inspiration. Cardiac enlargement. Diffuse pulmonary infiltrates, greater on the right. This likely represents edema although multifocal pneumonia could also have this appearance. Bilateral pleural effusions are probable. Progression since previous study. Calcified and tortuous aorta. Degenerative changes in the spine. Old right rib  fractures. IMPRESSION: Cardiac enlargement with diffuse pulmonary infiltrates, likely edema, and bilateral pleural effusions with progression since  previous study. Electronically Signed   By: Lucienne Capers M.D.   On: 04/05/2017 01:06   Dg Chest Port 1 View  Result Date: 04/01/2017 CLINICAL DATA:  Shortness of breath, history of COPD, non-Hodgkin's lymphoma, previous CVA, diabetes, nonsmoker. EXAM: PORTABLE CHEST 1 VIEW COMPARISON:  Chest x-ray of March 29, 2017 FINDINGS: The lungs are mildly hypoinflated. The interstitial markings are increased bilaterally. There is a small right pleural effusion. The pulmonary vascularity is engorged. The cardiac silhouette is enlarged. The patient has undergone previous CABG. The ICD is in stable position. There is calcification in the wall of the aortic arch. The observed bony thorax is unremarkable. IMPRESSION: CHF with mild to moderate interstitial edema. Small right pleural effusion. No definite pneumonia. Thoracic aortic atherosclerosis. Electronically Signed   By: David  Martinique M.D.   On: 04/01/2017 09:15   Ir Thoracentesis Asp Pleural Space W/img Guide  Result Date: 04/02/2017 INDICATION: Patient with right pleural effusion. Request is made for diagnostic and therapeutic right thoracentesis. EXAM: ULTRASOUND GUIDED DIAGNOSTIC AND THERAPEUTIC RIGHT THORACENTESIS MEDICATIONS: 10 mL 2% lidocaine COMPLICATIONS: None immediate. PROCEDURE: An ultrasound guided thoracentesis was thoroughly discussed with the patient and questions answered. The benefits, risks, alternatives and complications were also discussed. The patient understands and wishes to proceed with the procedure. Written consent was obtained. Ultrasound was performed to localize and mark an adequate pocket of fluid in the right chest. The area was then prepped and draped in the normal sterile fashion. 2% Lidocaine was used for local anesthesia. Under ultrasound guidance a Safe-T-Centesis catheter was introduced. Thoracentesis was performed. The catheter was removed and a dressing applied. FINDINGS: A total of approximately 900 mL of hazy, yellow  fluid was removed. Samples were sent to the laboratory as requested by the clinical team. IMPRESSION: Successful ultrasound guided diagnostic and therapeutic right thoracentesis yielding 900 mL of pleural fluid. Read by:  Brynda Greathouse PA-C Electronically Signed   By: Aletta Edouard M.D.   On: 04/02/2017 16:13   Anti-infectives: Anti-infectives (From admission, onward)   Start     Dose/Rate Route Frequency Ordered Stop   03/31/17 1400  azithromycin (ZITHROMAX) tablet 500 mg  Status:  Discontinued     500 mg Oral Daily 03/31/17 1232 04/02/17 1744   03/30/17 1630  azithromycin (ZITHROMAX) 500 mg in dextrose 5 % 250 mL IVPB  Status:  Discontinued     500 mg 250 mL/hr over 60 Minutes Intravenous Every 24 hours 03/29/17 1502 03/31/17 1231   03/30/17 1600  cefTRIAXone (ROCEPHIN) 1 g in dextrose 5 % 50 mL IVPB  Status:  Discontinued     1 g 100 mL/hr over 30 Minutes Intravenous Every 24 hours 03/29/17 1502 04/02/17 1744   03/29/17 1430  cefTRIAXone (ROCEPHIN) 1 g in dextrose 5 % 50 mL IVPB     1 g 100 mL/hr over 30 Minutes Intravenous  Once 03/29/17 1424 03/29/17 1647   03/29/17 1430  azithromycin (ZITHROMAX) 500 mg in dextrose 5 % 250 mL IVPB     500 mg 250 mL/hr over 60 Minutes Intravenous  Once 03/29/17 1424 03/29/17 1716      Assessment/Plan: s/p Procedure(s): BIV PACEMAKER REVISION (N/A) Gust with patient's husband present.  Patient still minimally responsive.  Vein map yesterday revealed good caliber cephalic vein throughout on the right.  Will be up a candidate for fistula attempts.  We will plan this during this  admission as she continues to improve   LOS: 11 days   Immaculate Crutcher 04/09/2017, 8:19 AM

## 2017-04-09 NOTE — Progress Notes (Signed)
Physical Therapy Treatment Patient Details Name: Destiny [name] MRN: [id number] DOB: 1940/08/28 Today's Date: 04/09/2017    History of Present Illness Pt is a 76 y.o. female who presented to the ED with 2 day history of worsening shortness of breath. Found to have B pleural effusions and volume overload. Attempted diuresis but ultimately required HD, new dx ESRD.  PMH significant for CHF with EF 20% with AICD, COPD on home oxygen of 2L, CKD stage IV, obesity, HTN, anemia, pressure injury, and diabetes.     PT Comments    Patient required +2 assist for functional transfers and bed mobility. Pt was able to take a few side steps before fatigued. Continue to progress as tolerated with anticipated d/c to SNF for further skilled PT services.     Follow Up Recommendations  SNF;Supervision/Assistance - 24 hour     Equipment Recommendations  Wheelchair (measurements PT);Wheelchair cushion (measurements PT)    Recommendations for Other Services       Precautions / Restrictions Precautions Precautions: Fall Precaution Comments: Watch O2 saturation Restrictions Weight Bearing Restrictions: No    Mobility  Bed Mobility Overal bed mobility: Needs Assistance Bed Mobility: Sit to Supine;Supine to Sit     Supine to sit: Max assist;HOB elevated Sit to supine: Mod assist;+2 for physical assistance   General bed mobility comments: cues for sequencing and use of rail and bed pad   Transfers Overall transfer level: Needs assistance Equipment used: Rolling walker (2 wheeled) Transfers: Sit to/from Stand Sit to Stand: Max assist;+2 physical assistance         General transfer comment: cues for safe hand placement and technique; assist to power up into standing  Ambulation/Gait             General Gait Details: pt was able to take 3 side steps toward [facility] and fatigued quickly; cues for posture and use of AD with assist for balance   Stairs            Wheelchair Mobility     Modified Rankin (Stroke Patients Only)       Balance                                            Cognition Arousal/Alertness: Awake/alert Behavior During Therapy: Anxious Overall Cognitive Status: No family/caregiver present to determine baseline cognitive functioning                                        Exercises      General Comments        Pertinent Vitals/Pain Pain Assessment: No/denies pain Pain Intervention(s): Monitored during session    Home Living                      Prior Function            PT Goals (current goals can now be found in the care plan section) Acute Rehab PT Goals Patient Stated Goal: to get better and go home PT Goal Formulation: With patient Time For Goal Achievement: 04/21/17 Potential to Achieve Goals: Fair Progress towards PT goals: Progressing toward goals    Frequency    Min 3X/week      PT Plan Current plan remains appropriate    Co-evaluation  AM-PAC PT "6 Clicks" Daily Activity  Outcome Measure  Difficulty turning over in bed (including adjusting bedclothes, sheets and blankets)?: Unable Difficulty moving from lying on back to sitting on the side of the bed? : Unable Difficulty sitting down on and standing up from a chair with arms (e.g., wheelchair, bedside commode, etc,.)?: Unable Help needed moving to and from a bed to chair (including a wheelchair)?: A Lot Help needed walking in hospital room?: Total Help needed climbing 3-5 steps with a railing? : Total 6 Click Score: 7    End of Session Equipment Utilized During Treatment: Oxygen;Gait belt Activity Tolerance: Patient limited by fatigue Patient left: with call bell/phone within reach;with bed alarm set;in bed Nurse Communication: Mobility status PT Visit Diagnosis: Unsteadiness on feet (R26.81);Muscle weakness (generalized) (M62.81);Difficulty in walking, not elsewhere classified (R26.2)      Time: 9390-3009 PT Time Calculation (min) (ACUTE ONLY): 25 min  Charges:  $Therapeutic Activity: 23-37 mins                    G Codes:       Earney Navy, PTA Pager: [phone]     Darliss Cheney 04/09/2017, 5:05 PM

## 2017-04-09 NOTE — Progress Notes (Signed)
Pt to dialysis via transport in stable condition, report called.

## 2017-04-09 NOTE — Progress Notes (Signed)
Background: 76 y.o.year-old w/complicated PMH DM, MO, h/o non-Hodgkins's lymphoma, chronic systolic/diastolic HF with Right HF, PAF (chronic anticoagulation), OHS/OSA on NIMV, pacer/ICD, CAD prior CABG, HLD, anemia on Aranesp. Known advanced CKD Stage 5 (GFR baseline 12-13 at last [facility] labs 02/19/17) followed by Dr. Marval Regal. Multiple prior AKI on CKD usually cardiorenal. Admitted 03/30/17 w/ progressive SOB, bilateral pleural effusions on CXR + R PNA. R thoracentesis of 900 ml on 11/19. Failed diuretics. HD initiated for new ESRD  1. New ESRD w/volume overload and progressive dyspnea 1. [facility]/Dr Early has seen( for AVF & has left ICD so will need to be right side). Optimize volume status 2. CLIP for outpt 3. Supporting BP with midodrine pre and mid 4. Plan HD again after [facility] via IR  2. Systolic (EF 20-35) and diastolic HF with ECFV overload 1. CXR post thoracentesis improved aeration 4. Thrombocytopenia plts chronically low 5. CKD-MBD - establish PTH status 6. Acute on chronic hypoxic resp failure. Multifactorial.  8. Anemia- 2/2 CKD. s/p Aranesp 60 (gets Q4weeks at [facility]) on 03/27/17 and recent dose Feraheme.  9.   H/o Non-Hodgkins lymphoma  Subjective: Interval History: Tolerated HD 2000cc yest  Objective: Vital signs in last 24 hours: Temp:  [97.7 F (36.5 C)-98.3 F (36.8 C)] 97.8 F (36.6 C) (11/27 0624) Pulse Rate:  [70-76] 70 (11/27 0956) Resp:  [17-21] 18 (11/27 0624) BP: (107-151)/(45-70) 108/60 (11/27 0956) SpO2:  [88 %-99 %] 97 % (11/27 0813) FiO2 (%):  [40 %] 40 % (11/27 0813) Weight:  [100.6 kg (221 lb 12.5 oz)-102.6 kg (226 lb 3.1 oz)] 101 kg (222 lb 10.6 oz) (11/27 0624) Weight change: 1.2 kg (2 lb 10.3 oz)  Intake/Output from previous day: 11/26 0701 - 11/27 0700 In: 300 [P.O.:300] Out: 2000  Intake/Output this shift: No intake/output data recorded.  General appearance: more alert today Resp: sl dim anteriorly Extremities: edema improved  significantly in both upper extremities  Lab Results: Recent Labs    04/07/17 0332 04/08/17 2302  WBC 6.9 7.8  HGB 11.1* 10.7*  HCT 33.9* 33.5*  PLT 85* 96*   BMET:  Recent Labs    04/07/17 0332 04/09/17 0004  NA 133* 134*  K 3.9 4.2  CL 100* 99*  CO2 24 28  GLUCOSE 87 110*  BUN 44* 33*  CREATININE 2.24* 2.04*  CALCIUM 7.7* 7.8*   No results for input(s): PTH in the last 72 hours. Iron Studies: No results for input(s): IRON, TIBC, TRANSFERRIN, FERRITIN in the last 72 hours. Studies/Results: No results found.  Scheduled: . amiodarone  200 mg Oral Daily  . atorvastatin  10 mg Oral q1800  . carvedilol  6.25 mg Oral BID WC  . darbepoetin (ARANESP) injection - DIALYSIS  60 mcg Intravenous Q Fri-HD  . escitalopram  10 mg Oral Daily  . feeding supplement (NEPRO CARB STEADY)  237 mL Oral BID BM  . gabapentin  100 mg Oral QHS  . Gerhardt's butt cream   Topical TID  . insulin aspart  0-9 Units Subcutaneous TID WC  . insulin NPH Human  10 Units Subcutaneous QAC breakfast  . ipratropium-albuterol  3 mL Nebulization Q6H  . levothyroxine  88 mcg Oral QAC breakfast  . loperamide  2 mg Oral Daily  . sodium chloride flush  3 mL Intravenous Q12H     LOS: 11 days   Estanislado Emms 04/09/2017,10:04 AM

## 2017-04-09 NOTE — Care Management Note (Signed)
Case Management Note  Patient Details  Name: Destiny [name] MRN: [id number] Date of Birth: 1941/04/13  Subjective/Objective: Pt presented for CHF and CKD Stage IV. Initiated on IV Lasix and failed diuresis. New to HD and plan will be for a tunneled cath via IR. CLIP in process.                    Action/Plan: CSW is following for disposition needs. Plan for SNF at [facility]. CSW did reach out to [place] in HD to make her aware of SNF at [facility]. CM will continue to monitor for additional needs.   Expected Discharge Date:  04/02/17               Expected Discharge Plan:  Skilled Nursing Facility  In-House Referral:  Clinical Social Work  Discharge planning Services  CM Consult  Post Acute Care Choice:  NA Choice offered to:  NA  DME Arranged:  N/A DME Agency:  NA  HH Arranged:  NA HH Agency:  NA  Status of Service:  Completed, signed off  If discussed at [employer] of Stay Meetings, dates discussed:    Additional Comments:  Bethena Roys, RN 04/09/2017, 10:44 AM

## 2017-04-09 NOTE — Plan of Care (Signed)
Pt was dialized 11/26 into 11/27 and was able to get 2 liters off with minimal complications.

## 2017-04-09 NOTE — Telephone Encounter (Signed)
Outreach made to husband.  Discussed Pt being discharged to [facility] soon from hospital.  Discussed Pt upcoming need for gen change as Pt has reached ERI.  Gave husband this nurse name and # to call if Pt discharged home.  Will check in with husband weekly-tentatively plan to replace generator by end of December at the latest.  Husband indicates understanding.   Will continue to closely monitor.

## 2017-04-09 NOTE — Progress Notes (Signed)
CSW confirmed [facility] SNF will have bed for patient when medically ready. SNF will provide transportation for patient to HD while admitted there. CSW updated Freda Munro in inpatient dialysis. CSW to follow and support with discharge.  Estanislado Emms, [place]

## 2017-04-09 NOTE — Progress Notes (Signed)
PROGRESS NOTE    Destiny [name]  BDZ:[id number] DOB: 05/16/1940 DOA: 03/29/2017 PCP: Mosie Lukes, MD      Brief Narrative:  76 yo F CHF EF 20% with AICD, COPD on home oxygen of 2 L, CKD stage IV, anticoagulation with Coumadin with prior LV mural thrombus, and DM who presents with few days dyspnea.   Treated with 4 days ceftriaxone and azithromycin, no improvement.  Attempted diuresis but very little urine output, so started dialysis.     Assessment & Plan:  Principal Problem:   Acute on chronic combined systolic and diastolic CHF (congestive heart failure) (HCC) Active Problems:   Diabetes mellitus type 2 with retinopathy ([place])   Hyperlipidemia   Essential hypertension   Automatic implantable cardioverter-defibrillator in situ   Hypothyroidism   Anemia   Morbid obesity (HCC)   COPD, severe (HCC)   CKD (chronic kidney disease) stage 4, GFR 15-29 ml/min ([place])   Community acquired pneumonia   Acute hypoxemic respiratory failure (HCC)   Pressure injury of skin   Acute on chronic hypoxic respiratory failure from congestive heart failure, pleural effusion, ESRD Failed diuretics, elected to begin HD in consultation with Nephrology. Temp cath in [place] placed.   -HD per Nephrology -Tunneled cath today -[facility]VS have been consulted re: permanent vascular access, see their note for barriers   ESRD now on HD She and I discussed Palliative Care today, but she asked that they not be consulted. -HD per Nephrology -MBD w/u, CLIP, Aranesp per Nephrology  Acute on chronic systolic CHF -As above -Continue aspirin, statin, BB  Hyponatremia Improved -Daily BMP  Suspected Community Acquired pneumonia Completed course of antibiotics.  Pleural fluid with elevated LDH, low protein.  Meets Light's criteria, but suspect this is not parapneumonic.  Chronic COPD -Continue bronchodilators PRN  Diabetes -Continu NPH -Continue SSI  Atrial fibrillation CHADS2Vasc 4 at least, but never  stroke, LV thrombus years ago.  On warfarin, amio. -Hold warfarin, for tunneled cath, restart after placement -Continue amiodarone, Coreg  Hypothyroidism -Continue levothyroxine  Other medications -Continue Bentyl -Continue Lexapro -Continue gabapentin, dose decreased now on HD  Elevated troponin Heart stretch in ESRD, doubt ACS  Thrombocytopenia Is chronic and unchanged over last 12 months.  In setting of NHL in remission, followed by Dr. Benay Spice.  No cirrhosis on last CT imaging.           DVT prophylaxis: N/A Code Status: FULL Family Communication: Husband Disposition Plan: HD, CLIP, VVS consult.  Home when CLIP process complete.      Consultants:   Nephrology, EP, IR  Procedures:   Thoracentesis  HD  Antimicrobials:   Ceftraixone and azithromycin, completed 4 days    Subjective: Appears listless and moribund, but states she feels fine/better.  No new chest pain, fever, cough, sputum, confusion.  Objective: Vitals:   04/09/17 1754 04/09/17 1950 04/09/17 2043 04/09/17 2137  BP: (!) 129/49  (!) 120/50   Pulse: 70 72 70   Resp:  17 17   Temp:   97.8 F (36.6 C)   TempSrc:   Oral   SpO2:  94% 96% 96%  Weight:      Height:        Intake/Output Summary (Last 24 hours) at 04/09/2017 2334 Last data filed at 04/09/2017 2044 Gross per 24 hour  Intake 110 ml  Output 2000 ml  Net -1890 ml   Filed Weights   04/08/17 2215 04/09/17 0149 04/09/17 0624  Weight: 102.6 kg (226 lb 3.1 oz)  100.6 kg (221 lb 12.5 oz) 101 kg (222 lb 10.6 oz)    Examination: General appearance: Elderly obese adult female, interactive, oriented just appears extremely tired.   HEENT: Anicteric, conjunctiva pink, lids and lashes normal. No more periorbital puffiness.  No nasal deformity, discharge, epistaxis.  Lips moist.   Skin: Warm and dry.  No jaundice.  No suspicious rashes or lesions.  Periorbital edema improved. Cardiac: RRR, nl S1-S2, SEM noted.  Capillary refill is  brisk.  JVP not visible.  Trace LE edema.  Radia pulses 2+ and symmetric. Respiratory: Very labored breathing, appears tired.  Diminshed thoughout, no wheezes. Abdomen: Abdomen soft.  No TTP. No ascites, distension, hepatosplenomegaly.   MSK: No deformities or effusions. Neuro: Oriented, globally very weak.  Facies symmetric, moves upper exttremities equally. Psych: Affect flat.    Data Reviewed: I have personally reviewed following labs and imaging studies:  CBC: Recent Labs  Lab 04/03/17 0414 04/07/17 0332 04/08/17 2302 04/09/17 0500  WBC 3.2* 6.9 7.8 7.1  HGB 10.2* 11.1* 10.7* 10.3*  HCT 31.5* 33.9* 33.5* 32.9*  MCV 89.0 89.4 90.5 92.2  PLT 94* 85* 96* 63*   Basic Metabolic Panel: Recent Labs  Lab 04/05/17 0310 04/06/17 0419 04/07/17 0332 04/09/17 0004 04/09/17 0549  NA 128* 130* 133* 134* 133*  K 5.1 4.1 3.9 4.2 4.3  CL 97* 99* 100* 99* 99*  CO2 21* 24 24 28 27   GLUCOSE 107* 173* 87 110* 131*  BUN 96* 70* 44* 33* 44*  CREATININE 3.65* 2.71* 2.24* 2.04* 2.70*  CALCIUM 8.4* 7.8* 7.7* 7.8* 8.2*  PHOS 4.5 4.2 3.1 2.6 3.8   GFR: Estimated Creatinine Clearance: 21.1 mL/min (A) (by C-G formula based on SCr of 2.7 mg/dL (H)). Liver Function Tests: Recent Labs  Lab 04/05/17 0310 04/06/17 0419 04/07/17 0332 04/09/17 0004 04/09/17 0549  ALBUMIN 2.8* 2.3* 2.5* 2.8* 2.5*   No results for input(s): LIPASE, AMYLASE in the last 168 hours. No results for input(s): AMMONIA in the last 168 hours. Coagulation Profile: Recent Labs  Lab 04/03/17 0414 04/04/17 0329 04/05/17 0310 04/06/17 0419 04/07/17 0332  INR 3.80 3.52 2.68 1.54 1.44   Cardiac Enzymes: Recent Labs  Lab 04/05/17 1313 04/05/17 1435 04/05/17 2301  TROPONINI 0.03* 0.03* 0.04*   BNP (last 3 results) No results for input(s): PROBNP in the last 8760 hours. HbA1C: No results for input(s): HGBA1C in the last 72 hours. CBG: Recent Labs  Lab 04/08/17 2114 04/09/17 0803 04/09/17 1110  04/09/17 1610 04/09/17 2150  GLUCAP 127* 104* 116* 129* 132*   Lipid Profile: No results for input(s): CHOL, HDL, LDLCALC, TRIG, CHOLHDL, LDLDIRECT in the last 72 hours. Thyroid Function Tests: No results for input(s): TSH, T4TOTAL, FREET4, T3FREE, THYROIDAB in the last 72 hours. Anemia Panel: No results for input(s): VITAMINB12, FOLATE, FERRITIN, TIBC, IRON, RETICCTPCT in the last 72 hours. Urine analysis:    Component Value Date/Time   COLORURINE YELLOW 03/29/2017 2348   APPEARANCEUR HAZY (A) 03/29/2017 2348   LABSPEC 1.013 03/29/2017 2348   PHURINE 5.0 03/29/2017 2348   GLUCOSEU NEGATIVE 03/29/2017 2348   GLUCOSEU NEGATIVE 06/25/2016 1002   HGBUR NEGATIVE 03/29/2017 2348   HGBUR large 05/17/2008 [place] 03/29/2017 [place] 03/29/2017 2348   PROTEINUR NEGATIVE 03/29/2017 2348   UROBILINOGEN 0.2 06/25/2016 1002   NITRITE NEGATIVE 03/29/2017 2348   LEUKOCYTESUR LARGE (A) 03/29/2017 2348   Sepsis Labs: @LABRCNTIP (procalcitonin:4,lacticidven:4)  ) Recent Results (from the past 240 hour(s))  C  difficile quick scan w PCR reflex     Status: None   Collection Time: 03/30/17 11:39 PM  Result Value Ref Range Status   C Diff antigen NEGATIVE NEGATIVE Final   C Diff toxin NEGATIVE NEGATIVE Final   C Diff interpretation No C. difficile detected.  Final  Giardia/Cryptosporidium EIA     Status: None   Collection Time: 03/30/17 11:39 PM  Result Value Ref Range Status   Giardia Ag, Stl Negative Negative Final   Cryptosporidium EIA Negative Negative Final    Comment: (NOTE) Performed At: [facility] [address] [place], [place] [id number] Rush Farmer MD BS:[id number]    Source of Sample STOOL  Final  Culture, body fluid-bottle     Status: None   Collection Time: 04/02/17 12:59 PM  Result Value Ref Range Status   Specimen Description FLUID RIGHT PLEURAL  Final   Special Requests NONE  Final   Culture NO GROWTH 5 DAYS  Final    Report Status 04/07/2017 FINAL  Final  Gram stain     Status: None   Collection Time: 04/02/17 12:59 PM  Result Value Ref Range Status   Specimen Description FLUID RIGHT PLEURAL  Final   Special Requests NONE  Final   Gram Stain NO WBC SEEN NO ORGANISMS SEEN   Final   Report Status 04/02/2017 FINAL  Final         Radiology Studies: No results found.      Scheduled Meds: . amiodarone  200 mg Oral Daily  . atorvastatin  10 mg Oral q1800  . carvedilol  6.25 mg Oral BID WC  . darbepoetin (ARANESP) injection - DIALYSIS  60 mcg Intravenous Q Fri-HD  . escitalopram  10 mg Oral Daily  . feeding supplement (NEPRO CARB STEADY)  237 mL Oral BID BM  . gabapentin  100 mg Oral QHS  . Gerhardt's butt cream   Topical TID  . insulin aspart  0-9 Units Subcutaneous TID WC  . insulin NPH Human  10 Units Subcutaneous QAC breakfast  . ipratropium-albuterol  3 mL Nebulization Q6H  . levothyroxine  88 mcg Oral QAC breakfast  . loperamide  2 mg Oral Daily  . midodrine      . sodium chloride flush  3 mL Intravenous Q12H   Continuous Infusions: . sodium chloride    . [START ON 04/12/2017]  ceFAZolin (ANCEF) IV    . ferric gluconate (FERRLECIT/NULECIT) IV Stopped (04/09/17 1355)     LOS: 11 days    Time spent: 25 minutes    Edwin Dada, MD [facility] Pager [phone]  If 7PM-7AM, please contact night-coverage [email or web address] Password [facility] 04/09/2017, 11:34 AM

## 2017-04-10 ENCOUNTER — Inpatient Hospital Stay (HOSPITAL_COMMUNITY): Payer: Medicare Other

## 2017-04-10 ENCOUNTER — Encounter (HOSPITAL_COMMUNITY): Payer: Self-pay | Admitting: Interventional Radiology

## 2017-04-10 DIAGNOSIS — R0603 Acute respiratory distress: Secondary | ICD-10-CM

## 2017-04-10 HISTORY — PX: IR FLUORO GUIDE CV LINE RIGHT: IMG2283

## 2017-04-10 HISTORY — PX: IR US GUIDE VASC ACCESS RIGHT: IMG2390

## 2017-04-10 LAB — RENAL FUNCTION PANEL
ALBUMIN: 2.4 g/dL — AB (ref 3.5–5.0)
Anion gap: 9 (ref 5–15)
BUN: 48 mg/dL — ABNORMAL HIGH (ref 6–20)
CALCIUM: 8.2 mg/dL — AB (ref 8.9–10.3)
CHLORIDE: 99 mmol/L — AB (ref 101–111)
CO2: 25 mmol/L (ref 22–32)
CREATININE: 2.76 mg/dL — AB (ref 0.44–1.00)
GFR, EST AFRICAN AMERICAN: 18 mL/min — AB (ref >60)
GFR, EST NON AFRICAN AMERICAN: 16 mL/min — AB (ref >60)
Glucose, Bld: 110 mg/dL — ABNORMAL HIGH (ref 65–99)
PHOSPHORUS: 3.7 mg/dL (ref 2.5–4.6)
Potassium: 4.1 mmol/L (ref 3.5–5.1)
SODIUM: 133 mmol/L — AB (ref 135–145)

## 2017-04-10 LAB — CBC
HCT: 31.9 % — ABNORMAL LOW (ref 36.0–46.0)
Hemoglobin: 10.1 g/dL — ABNORMAL LOW (ref 12.0–15.0)
MCH: 28.9 pg (ref 26.0–34.0)
MCHC: 31.7 g/dL (ref 30.0–36.0)
MCV: 91.1 fL (ref 78.0–100.0)
PLATELETS: 63 10*3/uL — AB (ref 150–400)
RBC: 3.5 MIL/uL — AB (ref 3.87–5.11)
RDW: 15.8 % — AB (ref 11.5–15.5)
WBC: 6.6 10*3/uL (ref 4.0–10.5)

## 2017-04-10 LAB — GLUCOSE, CAPILLARY
GLUCOSE-CAPILLARY: 109 mg/dL — AB (ref 65–99)
GLUCOSE-CAPILLARY: 115 mg/dL — AB (ref 65–99)
GLUCOSE-CAPILLARY: 114 mg/dL — AB (ref 65–99)

## 2017-04-10 MED ORDER — LIDOCAINE-PRILOCAINE 2.5-2.5 % EX CREA: 1.0000 "application " | TOPICAL_CREAM | CUTANEOUS | Status: DC | PRN

## 2017-04-10 MED ORDER — FENTANYL CITRATE (PF) 100 MCG/2ML IJ SOLN
INTRAMUSCULAR | Status: AC
  Filled 2017-04-10: qty 4

## 2017-04-10 MED ORDER — SODIUM CHLORIDE 0.9 % IV SOLN: 100.0000 mL | INTRAVENOUS | Status: DC | PRN

## 2017-04-10 MED ORDER — MIDAZOLAM HCL 2 MG/2ML IJ SOLN
INTRAMUSCULAR | Status: AC | PRN
  Administered 2017-04-10: 1 mg via INTRAVENOUS

## 2017-04-10 MED ORDER — ALTEPLASE 2 MG IJ SOLR: 2.0000 mg | Freq: Once | INTRAMUSCULAR | Status: DC | PRN

## 2017-04-10 MED ORDER — HEPARIN SODIUM (PORCINE) 5000 UNIT/ML IJ SOLN
5000.0000 [IU] | Freq: Three times a day (TID) | INTRAMUSCULAR | Status: DC
  Administered 2017-04-10 – 2017-04-12 (×4): 5000 [IU] via SUBCUTANEOUS
  Filled 2017-04-10 (×7): qty 1

## 2017-04-10 MED ORDER — LIDOCAINE HCL (PF) 1 % IJ SOLN
INTRAMUSCULAR | Status: AC
  Filled 2017-04-10: qty 30

## 2017-04-10 MED ORDER — LIDOCAINE HCL (PF) 1 % IJ SOLN
INTRAMUSCULAR | Status: AC | PRN
  Administered 2017-04-10: 10 mL

## 2017-04-10 MED ORDER — LIDOCAINE HCL (PF) 1 % IJ SOLN: 5.0000 mL | INTRAMUSCULAR | Status: DC | PRN

## 2017-04-10 MED ORDER — PENTAFLUOROPROP-TETRAFLUOROETH EX AERO: 1.0000 "application " | INHALATION_SPRAY | CUTANEOUS | Status: DC | PRN

## 2017-04-10 MED ORDER — FENTANYL CITRATE (PF) 100 MCG/2ML IJ SOLN
INTRAMUSCULAR | Status: AC | PRN
  Administered 2017-04-10 (×2): 25 ug via INTRAVENOUS

## 2017-04-10 MED ORDER — HEPARIN SODIUM (PORCINE) 1000 UNIT/ML IJ SOLN
INTRAMUSCULAR | Status: AC
  Filled 2017-04-10: qty 1

## 2017-04-10 MED ORDER — MIDAZOLAM HCL 2 MG/2ML IJ SOLN
INTRAMUSCULAR | Status: AC
  Filled 2017-04-10: qty 4

## 2017-04-10 MED ORDER — CEFAZOLIN SODIUM-DEXTROSE 2-4 GM/100ML-% IV SOLN
INTRAVENOUS | Status: AC
  Filled 2017-04-10: qty 100

## 2017-04-10 MED ORDER — HEPARIN SODIUM (PORCINE) 1000 UNIT/ML DIALYSIS
1000.0000 [IU] | INTRAMUSCULAR | Status: DC | PRN
  Administered 2017-04-10: 1000 [IU] via INTRAVENOUS_CENTRAL
  Filled 2017-04-10: qty 1

## 2017-04-10 NOTE — Progress Notes (Signed)
At Dr A Powell's request Interventional Radiology here at bedside to remove left IJ temporary hemodialysis catheter.  Pt id'ed by name and DOB.  Heparin removed from catheter, stitches cut and removed, catheter removed and pressure held until hemostasis achieved, area dressed with gauze and tape. Pt educated regarding keeping head of bed elevated.  jkc/wja

## 2017-04-10 NOTE — Progress Notes (Signed)
OT Cancellation Note  Patient Details Name: Destiny [name] MRN: [id number] DOB: Aug 25, 1940   Cancelled Treatment:    Reason Eval/Treat Not Completed: Patient at procedure or test/ unavailable(pt in IR)  Malka So 04/10/2017, 2:09 PM  04/10/2017 Nestor Lewandowsky, OTR/L Pager: [phone]

## 2017-04-10 NOTE — Progress Notes (Signed)
Rt note- increased IPAP for low VT, laying flat for procedure.

## 2017-04-10 NOTE — Progress Notes (Signed)
RT note- Patient taken to IR for new catheter, tolerated well, back to room at this time, report given to therapist.

## 2017-04-10 NOTE — Progress Notes (Signed)
PROGRESS NOTE    DAREN DOSWELL  KDX:[id number] DOB: 14-Jul-1940 DOA: 03/29/2017 PCP: Mosie Lukes, MD      Brief Narrative:  76 yo F CHF EF 20% with AICD, COPD on home oxygen of 2 L, CKD stage IV, anticoagulation with Coumadin with prior LV mural thrombus, and DM who presents with few days dyspnea.   Treated with 4 days ceftriaxone and azithromycin, no improvement.  Attempted diuresis but very little urine output, so started dialysis and tolerating well.  HD planned today. Pt seen and examined at her bedside. Denies chest pain, dyspnea or palpitations.   Assessment & Plan:  Principal Problem:   Acute on chronic combined systolic and diastolic CHF (congestive heart failure) (HCC) Active Problems:   Diabetes mellitus type 2 with retinopathy (HCC)   Hyperlipidemia   Essential hypertension   Automatic implantable cardioverter-defibrillator in situ   Hypothyroidism   Anemia   Morbid obesity (HCC)   COPD, severe (HCC)   CKD (chronic kidney disease) stage 4, GFR 15-29 ml/min ([place])   Community acquired pneumonia   Acute hypoxemic respiratory failure (HCC)   Pressure injury of skin   Acute on chronic hypoxic respiratory failure from congestive heart failure, pleural effusion, ESRD -Failed diuretics, elected to begin HD in consultation with Nephrology. Temp cath in [place] placed.   -HD per Nephrology -Tunneled cath today -VVS have been consulted re: permanent vascular access, see their note for barriers  ESRD now on HD -HD per Nephrology -MBD w/u, CLIP, Aranesp per Nephrology  Acute on chronic systolic CHF -As above -Continue aspirin, statin, BB  Hyponatremia Improved -Daily BMP  Suspected Community Acquired pneumonia Completed course of antibiotics.  Pleural fluid with elevated LDH, low protein.  Meets Light's criteria, but suspect this is not parapneumonic.  Chronic COPD -Continue bronchodilators PRN  Diabetes -Continu NPH -Continue SSI  Atrial  fibrillation CHADS2Vasc 4 at least, but never stroke, LV thrombus years ago.  On warfarin, amio. -Hold warfarin, for tunneled cath, restart after placement -Continue amiodarone, Coreg  Hypothyroidism -Continue levothyroxine  Other medications -Continue Bentyl -Continue Lexapro -Continue gabapentin, dose decreased now on HD  Elevated troponin Heart stretch in ESRD, doubt ACS  Thrombocytopenia Is chronic and unchanged over last 12 months.  In setting of NHL in remission, followed by Dr. Benay Spice.  No cirrhosis on last CT imaging.  plt 63    DVT prophylaxis: heparin sq 5000 u TID Code Status: FULL Family Communication: Husband Disposition Plan: HD, CLIP, VVS consult.  Home when CLIP process complete.   Consultants:   Nephrology, EP, IR  Procedures:   Thoracentesis  HD  Antimicrobials:   Ceftraixone and azithromycin, completed 4 days    Subjective: Appears listless and moribund, but states she feels fine/better.  No new chest pain, fever, cough, sputum, confusion.  Objective: Vitals:   04/10/17 0824 04/10/17 0840 04/10/17 0845 04/10/17 0850  BP: (!) 167/69 (!) 146/64 133/60 137/63  Pulse: 70 70 71 70  Resp: 15 19 15 15   Temp:      TempSrc:      SpO2: 100% 96% 96% 97%  Weight:      Height:        Intake/Output Summary (Last 24 hours) at 04/10/2017 0851 Last data filed at 04/10/2017 9767 Gross per 24 hour  Intake 50 ml  Output -383 ml  Net 433 ml   Filed Weights   04/09/17 2330 04/09/17 2355 04/10/17 0448  Weight: 102 kg (224 lb 13.9 oz) 102 kg (224 lb  13.9 oz) 101.3 kg (223 lb 5.2 oz)    Examination: General appearance: Elderly obese adult female, interactive, oriented just appears extremely tired.   HEENT: Anicteric, conjunctiva pink, lids and lashes normal. No more periorbital puffiness.  No nasal deformity, discharge, epistaxis.  Lips moist.   Skin: Warm and dry.  No jaundice.  No suspicious rashes or lesions.  Periorbital edema  improved. Cardiac: RRR, nl S1-S2, SEM noted.  Capillary refill is brisk.  JVP not visible.  Trace LE edema.  Radia pulses 2+ and symmetric. Respiratory: Very labored breathing, appears tired.  Diminshed thoughout, no wheezes. Abdomen: Abdomen soft.  No TTP. No ascites, distension, hepatosplenomegaly.   MSK: No deformities or effusions. Neuro: Oriented, globally very weak.  Facies symmetric, moves upper exttremities equally. Psych: Affect flat.    Data Reviewed: I have personally reviewed following labs and imaging studies:  CBC: Recent Labs  Lab 04/07/17 0332 04/08/17 2302 04/09/17 0500 04/10/17 0456  WBC 6.9 7.8 7.1 6.6  HGB 11.1* 10.7* 10.3* 10.1*  HCT 33.9* 33.5* 32.9* 31.9*  MCV 89.4 90.5 92.2 91.1  PLT 85* 96* 63* 63*   Basic Metabolic Panel: Recent Labs  Lab 04/06/17 0419 04/07/17 0332 04/09/17 0004 04/09/17 0549 04/10/17 0456  NA 130* 133* 134* 133* 133*  K 4.1 3.9 4.2 4.3 4.1  CL 99* 100* 99* 99* 99*  CO2 24 24 28 27 25   GLUCOSE 173* 87 110* 131* 110*  BUN 70* 44* 33* 44* 48*  CREATININE 2.71* 2.24* 2.04* 2.70* 2.76*  CALCIUM 7.8* 7.7* 7.8* 8.2* 8.2*  PHOS 4.2 3.1 2.6 3.8 3.7   GFR: Estimated Creatinine Clearance: 20.7 mL/min (A) (by C-G formula based on SCr of 2.76 mg/dL (H)). Liver Function Tests: Recent Labs  Lab 04/06/17 0419 04/07/17 0332 04/09/17 0004 04/09/17 0549 04/10/17 0456  ALBUMIN 2.3* 2.5* 2.8* 2.5* 2.4*   No results for input(s): LIPASE, AMYLASE in the last 168 hours. No results for input(s): AMMONIA in the last 168 hours. Coagulation Profile: Recent Labs  Lab 04/04/17 0329 04/05/17 0310 04/06/17 0419 04/07/17 0332  INR 3.52 2.68 1.54 1.44   Cardiac Enzymes: Recent Labs  Lab 04/05/17 1313 04/05/17 1435 04/05/17 2301  TROPONINI 0.03* 0.03* 0.04*   BNP (last 3 results) No results for input(s): PROBNP in the last 8760 hours. HbA1C: No results for input(s): HGBA1C in the last 72 hours. CBG: Recent Labs  Lab  04/09/17 0803 04/09/17 1110 04/09/17 1610 04/09/17 2150 04/10/17 0751  GLUCAP 104* 116* 129* 132* 109*   Lipid Profile: No results for input(s): CHOL, HDL, LDLCALC, TRIG, CHOLHDL, LDLDIRECT in the last 72 hours. Thyroid Function Tests: No results for input(s): TSH, T4TOTAL, FREET4, T3FREE, THYROIDAB in the last 72 hours. Anemia Panel: No results for input(s): VITAMINB12, FOLATE, FERRITIN, TIBC, IRON, RETICCTPCT in the last 72 hours. Urine analysis:    Component Value Date/Time   COLORURINE YELLOW 03/29/2017 2348   APPEARANCEUR HAZY (A) 03/29/2017 2348   LABSPEC 1.013 03/29/2017 2348   PHURINE 5.0 03/29/2017 2348   GLUCOSEU NEGATIVE 03/29/2017 2348   GLUCOSEU NEGATIVE 06/25/2016 1002   HGBUR NEGATIVE 03/29/2017 2348   HGBUR large 05/17/2008 [place] 03/29/2017 [place] 03/29/2017 2348   PROTEINUR NEGATIVE 03/29/2017 2348   UROBILINOGEN 0.2 06/25/2016 1002   NITRITE NEGATIVE 03/29/2017 2348   LEUKOCYTESUR LARGE (A) 03/29/2017 2348   Sepsis Labs: @LABRCNTIP (procalcitonin:4,lacticidven:4)  ) Recent Results (from the past 240 hour(s))  Culture, body fluid-bottle     Status:  None   Collection Time: 04/02/17 12:59 PM  Result Value Ref Range Status   Specimen Description FLUID RIGHT PLEURAL  Final   Special Requests NONE  Final   Culture NO GROWTH 5 DAYS  Final   Report Status 04/07/2017 FINAL  Final  Gram stain     Status: None   Collection Time: 04/02/17 12:59 PM  Result Value Ref Range Status   Specimen Description FLUID RIGHT PLEURAL  Final   Special Requests NONE  Final   Gram Stain NO WBC SEEN NO ORGANISMS SEEN   Final   Report Status 04/02/2017 FINAL  Final         Radiology Studies: No results found.      Scheduled Meds: . amiodarone  200 mg Oral Daily  . atorvastatin  10 mg Oral q1800  . carvedilol  6.25 mg Oral BID WC  . darbepoetin (ARANESP) injection - DIALYSIS  60 mcg Intravenous Q Fri-HD  . escitalopram   10 mg Oral Daily  . feeding supplement (NEPRO CARB STEADY)  237 mL Oral BID BM  . gabapentin  100 mg Oral QHS  . Gerhardt's butt cream   Topical TID  . insulin aspart  0-9 Units Subcutaneous TID WC  . insulin NPH Human  10 Units Subcutaneous QAC breakfast  . ipratropium-albuterol  3 mL Nebulization Q6H  . levothyroxine  88 mcg Oral QAC breakfast  . loperamide  2 mg Oral Daily  . sodium chloride flush  3 mL Intravenous Q12H   Continuous Infusions: . sodium chloride    . [START ON 04/12/2017]  ceFAZolin (ANCEF) IV 2 g (04/10/17 0829)  . ferric gluconate (FERRLECIT/NULECIT) IV Stopped (04/09/17 1355)     LOS: 12 days    Time spent: 25 minutes    Kayleen Memos, MD [facility] Pager [phone]  If 7PM-7AM, please contact night-coverage [email or web address] Password [facility] 04/10/2017, 11:34 AM

## 2017-04-10 NOTE — Progress Notes (Signed)
Received pt from [facility], RN, HD tx was started at 1652 w/o problem, pull/push/flush equally w/o problem, VSS, will cont to monitor while on HD tx

## 2017-04-10 NOTE — Progress Notes (Signed)
Background: 76 y.o.year-old w/complicated PMH DM, MO, h/o non-Hodgkins's lymphoma, chronic systolic/diastolic HF with Right HF, PAF (chronic anticoagulation), OHS/OSA on NIMV, pacer/ICD, CAD prior CABG, HLD, anemia on Aranesp. Known advanced CKD Stage 5 (GFR baseline 12-13 at last [facility] labs 02/19/17) followed by Dr. Marval Regal. Multiple prior AKI on CKD usually cardiorenal. Admitted 03/30/17 w/ progressive SOB, bilateral pleural effusions on CXR + R PNA. R thoracentesis of 900 ml on 11/19. Failed diuretics. HD initiated for new ESRD  1. New ESRDw/volume overload and progressive dyspnea 1. VVS/Dr Early has seen( for AVF &has left ICD so will need to be right side). Optimize volume status 2. CLIP for outpt 3. Supporting BP with midodrine pre and mid 4. Plan HD today 2. Systolic (EF 82-50) and diastolic HF with ECFV overload, post thoracentesis improved aeration-volume improved 4. 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 Acute on chronic hypoxic resp failure. Multifactorial.  8. Anemia- 2/2 CKD. s/p Aranesp 60 (gets Q4weeks at [facility]) on 03/27/17 and recent dose Feraheme.  9.   H/o Non-Hodgkins lymphoma  Subjective: Interval History: S/P TDC today Objective: Vital signs in last 24 hours: Temp:  [97.8 F (36.6 C)-98.2 F (36.8 C)] 97.8 F (36.6 C) (11/28 1425) Pulse Rate:  [69-74] 72 (11/28 1425) Resp:  [14-19] 15 (11/28 1425) BP: (107-167)/(44-69) 129/57 (11/28 1425) SpO2:  [94 %-100 %] 100 % (11/28 1425) FiO2 (%):  [40 %] 40 % (11/28 0752) Weight:  [101.3 kg (223 lb 5.2 oz)-102 kg (224 lb 13.9 oz)] 101.3 kg (223 lb 5.2 oz) (11/28 0448) Weight change: -0.6 kg (-5.2 oz)  Intake/Output from previous day: 11/27 0701 - 11/28 0700 In: 50 [P.O.:50] Out: -383  Intake/Output this shift: No intake/output data recorded.  General appearance: alert and cooperative Resp: clear to auscultation bilaterally Chest wall: R TDC, l CW bruise Extremities: edema less  edema  Lab Results: Recent Labs    04/09/17 0500 04/10/17 0456  WBC 7.1 6.6  HGB 10.3* 10.1*  HCT 32.9* 31.9*  PLT 63* 63*   BMET:  Recent Labs    04/09/17 0549 04/10/17 0456  NA 133* 133*  K 4.3 4.1  CL 99* 99*  CO2 27 25  GLUCOSE 131* 110*  BUN 44* 48*  CREATININE 2.70* 2.76*  CALCIUM 8.2* 8.2*   No results for input(s): PTH in the last 72 hours. Iron Studies: No results for input(s): IRON, TIBC, TRANSFERRIN, FERRITIN in the last 72 hours. Studies/Results: Ir Fluoro Guide Cv Line Right  Result Date: 04/10/2017 INDICATION: End-stage renal disease. In need of tunneled dialysis catheter placement for continuation of dialysis. EXAM: TUNNELED CENTRAL VENOUS HEMODIALYSIS CATHETER PLACEMENT WITH ULTRASOUND AND FLUOROSCOPIC GUIDANCE MEDICATIONS: Ancef 2 gm IV . The antibiotic was given in an appropriate time interval prior to skin puncture. ANESTHESIA/SEDATION: Versed 1 mg IV; Fentanyl 50 mcg IV; Moderate Sedation Time:  12 minutes The patient was continuously monitored during the procedure by the interventional radiology nurse under my direct supervision. FLUOROSCOPY TIME:  Fluoroscopy Time: 30 seconds (8 mGy). COMPLICATIONS: None immediate. PROCEDURE: Informed written consent was obtained from the patient after a discussion of the risks, benefits, and alternatives to treatment. Questions regarding the procedure were encouraged and answered. The right neck and chest were prepped with chlorhexidine in a sterile fashion, and a sterile drape was applied covering the operative field. Maximum barrier sterile technique with sterile gowns and gloves were used for the procedure. A timeout was performed prior to the initiation of the procedure. After  creating a small venotomy incision, a micropuncture kit was utilized to access the internal jugular vein. Real-time ultrasound guidance was utilized for vascular access including the acquisition of a permanent ultrasound image documenting patency of  the accessed vessel. The microwire was utilized to measure appropriate catheter length. A stiff Glidewire was advanced to the level of the IVC and the micropuncture sheath was exchanged for a peel-away sheath. A palindrome tunneled hemodialysis catheter measuring 19 cm from tip to cuff was tunneled in a retrograde fashion from the anterior chest wall to the venotomy incision. The catheter was then placed through the peel-away sheath with tips ultimately positioned within the superior aspect of the right atrium. Final catheter positioning was confirmed and documented with a spot radiographic image. The catheter aspirates and flushes normally. The catheter was flushed with appropriate volume heparin dwells. The catheter exit site was secured with a 0-Prolene retention suture. The venotomy incision was closed with an interrupted 4-0 Vicryl, Dermabond and Steri-strips. Dressings were applied. The patient tolerated the procedure well without immediate post procedural complication. IMPRESSION: Successful placement of 19 cm tip to cuff tunneled hemodialysis catheter via the right internal jugular vein with tips terminating within the superior aspect of the right atrium. The catheter is ready for immediate use. Electronically Signed   By: Sandi Mariscal M.D.   On: 04/10/2017 09:03   Ir US Guide Vasc Access Right  Result Date: 04/10/2017 INDICATION: End-stage renal disease. In need of tunneled dialysis catheter placement for continuation of dialysis. EXAM: TUNNELED CENTRAL VENOUS HEMODIALYSIS CATHETER PLACEMENT WITH ULTRASOUND AND FLUOROSCOPIC GUIDANCE MEDICATIONS: Ancef 2 gm IV . The antibiotic was given in an appropriate time interval prior to skin puncture. ANESTHESIA/SEDATION: Versed 1 mg IV; Fentanyl 50 mcg IV; Moderate Sedation Time:  12 minutes The patient was continuously monitored during the procedure by the interventional radiology nurse under my direct supervision. FLUOROSCOPY TIME:  Fluoroscopy Time: 30 seconds  (8 mGy). COMPLICATIONS: None immediate. PROCEDURE: Informed written consent was obtained from the patient after a discussion of the risks, benefits, and alternatives to treatment. Questions regarding the procedure were encouraged and answered. The right neck and chest were prepped with chlorhexidine in a sterile fashion, and a sterile drape was applied covering the operative field. Maximum barrier sterile technique with sterile gowns and gloves were used for the procedure. A timeout was performed prior to the initiation of the procedure. After creating a small venotomy incision, a micropuncture kit was utilized to access the internal jugular vein. Real-time ultrasound guidance was utilized for vascular access including the acquisition of a permanent ultrasound image documenting patency of the accessed vessel. The microwire was utilized to measure appropriate catheter length. A stiff Glidewire was advanced to the level of the IVC and the micropuncture sheath was exchanged for a peel-away sheath. A palindrome tunneled hemodialysis catheter measuring 19 cm from tip to cuff was tunneled in a retrograde fashion from the anterior chest wall to the venotomy incision. The catheter was then placed through the peel-away sheath with tips ultimately positioned within the superior aspect of the right atrium. Final catheter positioning was confirmed and documented with a spot radiographic image. The catheter aspirates and flushes normally. The catheter was flushed with appropriate volume heparin dwells. The catheter exit site was secured with a 0-Prolene retention suture. The venotomy incision was closed with an interrupted 4-0 Vicryl, Dermabond and Steri-strips. Dressings were applied. The patient tolerated the procedure well without immediate post procedural complication. IMPRESSION: Successful placement of 19 cm tip  to cuff tunneled hemodialysis catheter via the right internal jugular vein with tips terminating within the  superior aspect of the right atrium. The catheter is ready for immediate use. Electronically Signed   By: Sandi Mariscal M.D.   On: 04/10/2017 09:03   Scheduled: . amiodarone  200 mg Oral Daily  . atorvastatin  10 mg Oral q1800  . carvedilol  6.25 mg Oral BID WC  . darbepoetin (ARANESP) injection - DIALYSIS  60 mcg Intravenous Q Fri-HD  . escitalopram  10 mg Oral Daily  . feeding supplement (NEPRO CARB STEADY)  237 mL Oral BID BM  . gabapentin  100 mg Oral QHS  . Gerhardt's butt cream   Topical TID  . insulin aspart  0-9 Units Subcutaneous TID WC  . insulin NPH Human  10 Units Subcutaneous QAC breakfast  . ipratropium-albuterol  3 mL Nebulization Q6H  . levothyroxine  88 mcg Oral QAC breakfast  . loperamide  2 mg Oral Daily  . sodium chloride flush  3 mL Intravenous Q12H     LOS: 12 days   Estanislado Emms 04/10/2017,3:37 PM

## 2017-04-10 NOTE — Procedures (Signed)
Pre-procedure Diagnosis: ESRD Post-procedure Diagnosis: Same  Successful placement of tunneled HD catheter with tips terminating within the superior aspect of the right atrium.    Complications: None Immediate  EBL: Minimal   The catheter is ready for immediate use.   Jay Margert Edsall, MD Pager #: [phone]   

## 2017-04-10 NOTE — Progress Notes (Signed)
HD tx completed @ 2025 w/o problem, UF  Goal met, blood rinsed back, VSS, report called to Primary RN, put pt in for transport to take back to [place] at primary nurse request

## 2017-04-10 NOTE — Progress Notes (Signed)
Cathflo were instilled and let dwell for 25mins in pt.'s HD catheter port X2 per MD order. After 58mins of start in HD machine, arterial pressure was going up every few seconds and when checked for patency. HD catheter were flushing but cannot aspirate blood. Nephrologist orderted to send the pt to room. Pt is not in any acute distress.

## 2017-04-11 LAB — BASIC METABOLIC PANEL
Anion gap: 8 (ref 5–15)
BUN: 26 mg/dL — ABNORMAL HIGH (ref 6–20)
CALCIUM: 7.9 mg/dL — AB (ref 8.9–10.3)
CHLORIDE: 99 mmol/L — AB (ref 101–111)
CO2: 26 mmol/L (ref 22–32)
CREATININE: 2.04 mg/dL — AB (ref 0.44–1.00)
GFR, EST AFRICAN AMERICAN: 26 mL/min — AB (ref >60)
GFR, EST NON AFRICAN AMERICAN: 23 mL/min — AB (ref >60)
Glucose, Bld: 94 mg/dL (ref 65–99)
Potassium: 3.6 mmol/L (ref 3.5–5.1)
SODIUM: 133 mmol/L — AB (ref 135–145)

## 2017-04-11 LAB — GLUCOSE, CAPILLARY
GLUCOSE-CAPILLARY: 154 mg/dL — AB (ref 65–99)
GLUCOSE-CAPILLARY: 152 mg/dL — AB (ref 65–99)
GLUCOSE-CAPILLARY: 140 mg/dL — AB (ref 65–99)
Glucose-Capillary: 103 mg/dL — ABNORMAL HIGH (ref 65–99)

## 2017-04-11 LAB — CBC
HEMATOCRIT: 31.1 % — AB (ref 36.0–46.0)
HEMOGLOBIN: 10 g/dL — AB (ref 12.0–15.0)
MCH: 29.2 pg (ref 26.0–34.0)
MCHC: 32.2 g/dL (ref 30.0–36.0)
MCV: 90.7 fL (ref 78.0–100.0)
Platelets: 58 10*3/uL — ABNORMAL LOW (ref 150–400)
RBC: 3.43 MIL/uL — ABNORMAL LOW (ref 3.87–5.11)
RDW: 15.9 % — ABNORMAL HIGH (ref 11.5–15.5)
WBC: 6.9 10*3/uL (ref 4.0–10.5)

## 2017-04-11 MED ORDER — GABAPENTIN 300 MG PO CAPS
300.0000 mg | ORAL_CAPSULE | Freq: Every day | ORAL | Status: DC
  Administered 2017-04-11 – 2017-04-16 (×6): 300 mg via ORAL
  Filled 2017-04-11 (×7): qty 1

## 2017-04-11 MED ORDER — NEPRO/CARBSTEADY PO LIQD
237.0000 mL | Freq: Three times a day (TID) | ORAL | Status: DC
  Administered 2017-04-11 – 2017-04-16 (×7): 237 mL via ORAL

## 2017-04-11 MED ORDER — DEXTROSE 5 % IV SOLN: 1.5000 g | INTRAVENOUS | Status: DC

## 2017-04-11 MED ORDER — WARFARIN SODIUM 5 MG PO TABS
5.0000 mg | ORAL_TABLET | Freq: Once | ORAL | Status: AC
  Administered 2017-04-11: 5 mg via ORAL
  Filled 2017-04-11: qty 1

## 2017-04-11 MED ORDER — WARFARIN - PHARMACIST DOSING INPATIENT
Freq: Every day | Status: DC
  Administered 2017-04-11 – 2017-04-16 (×2)

## 2017-04-11 NOTE — Progress Notes (Signed)
PROGRESS NOTE    Destiny [name]  TMY:[id number] DOB: 07-Aug-1940 DOA: 03/29/2017 PCP: Mosie Lukes, MD    Brief Narrative:  76 yo F CHF EF 20% with AICD, COPD on home oxygen of 2 L, CKD stage IV, anticoagulation with Coumadin with prior LV mural thrombus, and DM who presents with few days dyspnea.   Treated with 4 days ceftriaxone and azithromycin, no improvement.  Attempted diuresis but very little urine output, so started dialysis and tolerating well.  HD yesterday 04/10/17. Pt seen and examined at her bedside. Denies chest pain, dyspnea or palpitations. Very deconditioned. Becomes dyspneic with minimal exertion.   Assessment & Plan:  Principal Problem:   Acute on chronic combined systolic and diastolic CHF (congestive heart failure) (HCC) Active Problems:   Diabetes mellitus type 2 with retinopathy (HCC)   Hyperlipidemia   Essential hypertension   Automatic implantable cardioverter-defibrillator in situ   Hypothyroidism   Anemia   Morbid obesity (HCC)   COPD, severe (HCC)   CKD (chronic kidney disease) stage 4, GFR 15-29 ml/min ([place])   Community acquired pneumonia   Acute hypoxemic respiratory failure (HCC)   Pressure injury of skin   Acute on chronic hypoxic respiratory failure from chronic systolic congestive heart failure, pleural effusion, fluid overload -Failed diuretics, elected to begin HD in consultation with Nephrology.  -improved after dialysis -Temp cath in LIJ placed.   -HD per Nephrology -thoracentesis right  900 cc removed 04/02/17. Pleural fluid with elevated LDH, low protein.  Meets Light's criteria, but suspect this is not parapneumonic.  ESRD now on HD -HD per Nephrology -MBD w/u, CLIP, Aranesp per Nephrology  Acute on chronic systolic CHF -As above -Continue aspirin, statin, BB, HD  Hyponatremia -Improved -Na+ 133 -Daily BMP  Deconditioning/ambulatory dysfunction -PT/OT evaluate and treat -OOB to chair with every shift -fall  precaution  Suspected Community Acquired pneumonia, present on admission Completed course of antibiotics.    Chronic COPD -Continue bronchodilators PRN  Diabetes -Continu NPH -Continue SSI -bmp  Atrial fibrillation CHADS2Vasc 4 at least, but never stroke, LV thrombus years ago.  On warfarin, amiodarone. -Hold warfarin, for tunneled cath, restart after placement -Continue amiodarone, Coreg -INR am -cbc am  Hypothyroidism -Continue levothyroxine  Other medications -Continue Bentyl -Continue Lexapro -Continue gabapentin, dose decreased now on HD  Elevated troponin Heart stretch in ESRD, doubt ACS  Thrombocytopenia Is chronic and unchanged over last 12 months.  In setting of NHL in remission, followed by Dr. Benay Spice.  No cirrhosis on last CT imaging.  plt 63    DVT prophylaxis: heparin sq 5000 u TID Code Status: FULL Family Communication: Husband Disposition Plan: HD, CLIP, VVS consult.  Home when CLIP process complete.   Consultants:   Nephrology, EP, IR  Procedures:   Thoracentesis  HD  Antimicrobials:   Ceftraixone and azithromycin, completed 4 days    Subjective: Appears listless and moribund, but states she feels fine/better.  No new chest pain, fever, cough, sputum, confusion.  Objective: Vitals:   04/11/17 0822 04/11/17 0840 04/11/17 1404 04/11/17 1451  BP:  (!) 127/50 (!) 136/58   Pulse:  70 74   Resp:   20   Temp:   97.8 F (36.6 C)   TempSrc:   Oral   SpO2: 91%  90% 91%  Weight:      Height:        Intake/Output Summary (Last 24 hours) at 04/11/2017 1714 Last data filed at 04/11/2017 1000 Gross per 24 hour  Intake 240  ml  Output 3004 ml  Net -2764 ml   Filed Weights   04/10/17 1652 04/10/17 2032 04/11/17 0534  Weight: 101.3 kg (223 lb 5.2 oz) 98.3 kg (216 lb 11.4 oz) 96.6 kg (212 lb 15.4 oz)    Examination: General appearance: Elderly obese adult female, interactive, oriented just appears extremely tired.   HEENT:  Anicteric, conjunctiva pink, lids and lashes normal. No more periorbital puffiness.  No nasal deformity, discharge, epistaxis.  Lips moist.   Skin: Warm and dry.  No jaundice.  No suspicious rashes or lesions.  Periorbital edema improved. Cardiac: RRR, nl S1-S2, SEM noted.  Capillary refill is brisk.  JVP not visible.  Trace LE edema.  Radia pulses 2+ and symmetric. Respiratory: Very labored breathing, appears tired.  Diminshed thoughout, no wheezes. Abdomen: Abdomen soft.  No TTP. No ascites, distension, hepatosplenomegaly.   MSK: No deformities or effusions. Neuro: Oriented, globally very weak.  Facies symmetric, moves upper exttremities equally. Psych: Affect flat.    Data Reviewed: I have personally reviewed following labs and imaging studies:  CBC: Recent Labs  Lab 04/07/17 0332 04/08/17 2302 04/09/17 0500 04/10/17 0456 04/11/17 0310  WBC 6.9 7.8 7.1 6.6 6.9  HGB 11.1* 10.7* 10.3* 10.1* 10.0*  HCT 33.9* 33.5* 32.9* 31.9* 31.1*  MCV 89.4 90.5 92.2 91.1 90.7  PLT 85* 96* 63* 63* 58*   Basic Metabolic Panel: Recent Labs  Lab 04/06/17 0419 04/07/17 0332 04/09/17 0004 04/09/17 0549 04/10/17 0456 04/11/17 0310  NA 130* 133* 134* 133* 133* 133*  K 4.1 3.9 4.2 4.3 4.1 3.6  CL 99* 100* 99* 99* 99* 99*  CO2 _0 GLUCOSE 173* 87 110* 131* 110* 94  BUN 70* 44* 33* 44* 48* 26*  CREATININE 2.71* 2.24* 2.04* 2.70* 2.76* 2.04*  CALCIUM 7.8* 7.7* 7.8* 8.2* 8.2* 7.9*  PHOS 4.2 3.1 2.6 3.8 3.7  --    GFR: Estimated Creatinine Clearance: 27.3 mL/min (A) (by C-G formula based on SCr of 2.04 mg/dL (H)). Liver Function Tests: Recent Labs  Lab 04/06/17 0419 04/07/17 0332 04/09/17 0004 04/09/17 0549 04/10/17 0456  ALBUMIN 2.3* 2.5* 2.8* 2.5* 2.4*   No results for input(s): LIPASE, AMYLASE in the last 168 hours. No results for input(s): AMMONIA in the last 168 hours. Coagulation Profile: Recent Labs  Lab 04/05/17 0310 04/06/17 0419 04/07/17 0332  INR 2.68  1.54 1.44   Cardiac Enzymes: Recent Labs  Lab 04/05/17 1313 04/05/17 1435 04/05/17 2301  TROPONINI 0.03* 0.03* 0.04*   BNP (last 3 results) No results for input(s): PROBNP in the last 8760 hours. HbA1C: No results for input(s): HGBA1C in the last 72 hours. CBG: Recent Labs  Lab 04/10/17 0751 04/10/17 1100 04/10/17 1605 04/11/17 0810 04/11/17 1136  GLUCAP 109* 115* 114* 103* 154*   Lipid Profile: No results for input(s): CHOL, HDL, LDLCALC, TRIG, CHOLHDL, LDLDIRECT in the last 72 hours. Thyroid Function Tests: No results for input(s): TSH, T4TOTAL, FREET4, T3FREE, THYROIDAB in the last 72 hours. Anemia Panel: No results for input(s): VITAMINB12, FOLATE, FERRITIN, TIBC, IRON, RETICCTPCT in the last 72 hours. Urine analysis:    Component Value Date/Time   COLORURINE YELLOW 03/29/2017 2348   APPEARANCEUR HAZY (A) 03/29/2017 2348   LABSPEC 1.013 03/29/2017 2348   PHURINE 5.0 03/29/2017 2348   GLUCOSEU NEGATIVE 03/29/2017 2348   GLUCOSEU NEGATIVE 06/25/2016 1002   HGBUR NEGATIVE 03/29/2017 2348   HGBUR large 05/17/2008 1325   [place] 03/29/2017 2348   KETONESUR NEGATIVE  03/29/2017 2348   PROTEINUR NEGATIVE 03/29/2017 2348   UROBILINOGEN 0.2 06/25/2016 1002   NITRITE NEGATIVE 03/29/2017 2348   LEUKOCYTESUR LARGE (A) 03/29/2017 2348   Sepsis Labs: _0 (procalcitonin:4,lacticidven:4)  ) Recent Results (from the past 240 hour(s))  Culture, body fluid-bottle     Status: None   Collection Time: 04/02/17 12:59 PM  Result Value Ref Range Status   Specimen Description FLUID RIGHT PLEURAL  Final   Special Requests NONE  Final   Culture NO GROWTH 5 DAYS  Final   Report Status 04/07/2017 FINAL  Final  Gram stain     Status: None   Collection Time: 04/02/17 12:59 PM  Result Value Ref Range Status   Specimen Description FLUID RIGHT PLEURAL  Final   Special Requests NONE  Final   Gram Stain NO WBC SEEN NO ORGANISMS SEEN   Final   Report Status  04/02/2017 FINAL  Final         Radiology Studies: Ir Fluoro Guide Cv Line Right  Result Date: 04/10/2017 INDICATION: End-stage renal disease. In need of tunneled dialysis catheter placement for continuation of dialysis. EXAM: TUNNELED CENTRAL VENOUS HEMODIALYSIS CATHETER PLACEMENT WITH ULTRASOUND AND FLUOROSCOPIC GUIDANCE MEDICATIONS: Ancef 2 gm IV . The antibiotic was given in an appropriate time interval prior to skin puncture. ANESTHESIA/SEDATION: Versed 1 mg IV; Fentanyl 50 mcg IV; Moderate Sedation Time:  12 minutes The patient was continuously monitored during the procedure by the interventional radiology nurse under my direct supervision. FLUOROSCOPY TIME:  Fluoroscopy Time: 30 seconds (8 mGy). COMPLICATIONS: None immediate. PROCEDURE: Informed written consent was obtained from the patient after a discussion of the risks, benefits, and alternatives to treatment. Questions regarding the procedure were encouraged and answered. The right neck and chest were prepped with chlorhexidine in a sterile fashion, and a sterile drape was applied covering the operative field. Maximum barrier sterile technique with sterile gowns and gloves were used for the procedure. A timeout was performed prior to the initiation of the procedure. After creating a small venotomy incision, a micropuncture kit was utilized to access the internal jugular vein. Real-time ultrasound guidance was utilized for vascular access including the acquisition of a permanent ultrasound image documenting patency of the accessed vessel. The microwire was utilized to measure appropriate catheter length. A stiff Glidewire was advanced to the level of the IVC and the micropuncture sheath was exchanged for a peel-away sheath. A palindrome tunneled hemodialysis catheter measuring 19 cm from tip to cuff was tunneled in a retrograde fashion from the anterior chest wall to the venotomy incision. The catheter was then placed through the peel-away  sheath with tips ultimately positioned within the superior aspect of the right atrium. Final catheter positioning was confirmed and documented with a spot radiographic image. The catheter aspirates and flushes normally. The catheter was flushed with appropriate volume heparin dwells. The catheter exit site was secured with a 0-Prolene retention suture. The venotomy incision was closed with an interrupted 4-0 Vicryl, Dermabond and Steri-strips. Dressings were applied. The patient tolerated the procedure well without immediate post procedural complication. IMPRESSION: Successful placement of 19 cm tip to cuff tunneled hemodialysis catheter via the right internal jugular vein with tips terminating within the superior aspect of the right atrium. The catheter is ready for immediate use. Electronically Signed   By: Sandi Mariscal M.D.   On: 04/10/2017 09:03   Ir US Guide Vasc Access Right  Result Date: 04/10/2017 INDICATION: End-stage renal disease. In need of tunneled dialysis catheter placement for  continuation of dialysis. EXAM: TUNNELED CENTRAL VENOUS HEMODIALYSIS CATHETER PLACEMENT WITH ULTRASOUND AND FLUOROSCOPIC GUIDANCE MEDICATIONS: Ancef 2 gm IV . The antibiotic was given in an appropriate time interval prior to skin puncture. ANESTHESIA/SEDATION: Versed 1 mg IV; Fentanyl 50 mcg IV; Moderate Sedation Time:  12 minutes The patient was continuously monitored during the procedure by the interventional radiology nurse under my direct supervision. FLUOROSCOPY TIME:  Fluoroscopy Time: 30 seconds (8 mGy). COMPLICATIONS: None immediate. PROCEDURE: Informed written consent was obtained from the patient after a discussion of the risks, benefits, and alternatives to treatment. Questions regarding the procedure were encouraged and answered. The right neck and chest were prepped with chlorhexidine in a sterile fashion, and a sterile drape was applied covering the operative field. Maximum barrier sterile technique with  sterile gowns and gloves were used for the procedure. A timeout was performed prior to the initiation of the procedure. After creating a small venotomy incision, a micropuncture kit was utilized to access the internal jugular vein. Real-time ultrasound guidance was utilized for vascular access including the acquisition of a permanent ultrasound image documenting patency of the accessed vessel. The microwire was utilized to measure appropriate catheter length. A stiff Glidewire was advanced to the level of the IVC and the micropuncture sheath was exchanged for a peel-away sheath. A palindrome tunneled hemodialysis catheter measuring 19 cm from tip to cuff was tunneled in a retrograde fashion from the anterior chest wall to the venotomy incision. The catheter was then placed through the peel-away sheath with tips ultimately positioned within the superior aspect of the right atrium. Final catheter positioning was confirmed and documented with a spot radiographic image. The catheter aspirates and flushes normally. The catheter was flushed with appropriate volume heparin dwells. The catheter exit site was secured with a 0-Prolene retention suture. The venotomy incision was closed with an interrupted 4-0 Vicryl, Dermabond and Steri-strips. Dressings were applied. The patient tolerated the procedure well without immediate post procedural complication. IMPRESSION: Successful placement of 19 cm tip to cuff tunneled hemodialysis catheter via the right internal jugular vein with tips terminating within the superior aspect of the right atrium. The catheter is ready for immediate use. Electronically Signed   By: Sandi Mariscal M.D.   On: 04/10/2017 09:03        Scheduled Meds: . amiodarone  200 mg Oral Daily  . atorvastatin  10 mg Oral q1800  . carvedilol  6.25 mg Oral BID WC  . darbepoetin (ARANESP) injection - DIALYSIS  60 mcg Intravenous Q Fri-HD  . escitalopram  10 mg Oral Daily  . feeding supplement (NEPRO CARB  STEADY)  237 mL Oral TID BM  . gabapentin  300 mg Oral QHS  . Gerhardt's butt cream   Topical TID  . heparin subcutaneous  5,000 Units Subcutaneous Q8H  . insulin aspart  0-9 Units Subcutaneous TID WC  . insulin NPH Human  10 Units Subcutaneous QAC breakfast  . ipratropium-albuterol  3 mL Nebulization Q6H  . levothyroxine  88 mcg Oral QAC breakfast  . loperamide  2 mg Oral Daily  . sodium chloride flush  3 mL Intravenous Q12H   Continuous Infusions: . sodium chloride    . [START ON 04/12/2017] cefUROXime (ZINACEF)  IV       LOS: 13 days    Time spent: 25 minutes    Kayleen Memos, MD [facility] Pager [phone]  If 7PM-7AM, please contact night-coverage [email or web address] Password [facility] 04/11/2017, 11:34 AM

## 2017-04-11 NOTE — Care Management Important Message (Signed)
Important Message  Patient Details  Name: Destiny [name] MRN: [id number] Date of Birth: 1940-08-17   Medicare Important Message Given:  Yes    [name] May 04/11/2017, 9:27 AM

## 2017-04-11 NOTE — Progress Notes (Signed)
Occupational Therapy Treatment Patient Details Name: Destiny [name] MRN: [id number] DOB: 1940-08-23 Today's Date: 04/11/2017    History of present illness Pt is a 76 y.o. female who presented to the ED with 2 day history of worsening shortness of breath. Found to have B pleural effusions and volume overload. Attempted diuresis but ultimately required HD, new dx ESRD.  PMH significant for CHF with EF 20% with AICD, COPD on home oxygen of 2L, CKD stage IV, obesity, HTN, anemia, pressure injury, and diabetes.    OT comments  Pt continues to demonstrate poor activity tolerance, but VSS on 4L 02 during activity. Pt able to ambulate short distance and stand at sink in attempt to comb her hair. Requires +2 assist for all mobility. SNF remains the appropriate d/c disposition.  Follow Up Recommendations  SNF;Supervision/Assistance - 24 hour    Equipment Recommendations       Recommendations for Other Services      Precautions / Restrictions Precautions Precautions: Fall Precaution Comments: Watch O2 saturation       Mobility Bed Mobility Overal bed mobility: Needs Assistance Bed Mobility: Rolling;Sidelying to Sit Rolling: Max assist Sidelying to sit: +2 for physical assistance;Max assist       General bed mobility comments: cues for sequencing and use of rail and bed pad, assist for all aspects  Transfers Overall transfer level: Needs assistance Equipment used: Rolling walker (2 wheeled) Transfers: Sit to/from Stand Sit to Stand: +2 physical assistance;Mod assist;Max assist         General transfer comment: cues for hand placement, assist to rise and steady    Balance Overall balance assessment: Needs assistance   Sitting balance-Leahy Scale: Fair Sitting balance - Comments: worked on mid range reaching     Standing balance-Leahy Scale: Poor Standing balance comment: requires B UE support and min to mod assist                           ADL either performed  or assessed with clinical judgement   ADL Overall ADL's : Needs assistance/impaired Eating/Feeding: Maximal assistance;Sitting Eating/Feeding Details (indicate cue type and reason): needs foam build up Grooming: Brushing hair;Sitting;Maximal assistance                   Toilet Transfer: +2 for physical assistance;Moderate assistance;Ambulation;RW   Toileting- Clothing Manipulation and Hygiene: +2 for physical assistance;Total assistance;Sit to/from stand       Functional mobility during ADLs: +2 for physical assistance;Moderate assistance;Rolling walker General ADL Comments: Pt able to walk x 5 feet.     Vision       Perception     Praxis      Cognition Arousal/Alertness: Awake/alert Behavior During Therapy: Flat affect Overall Cognitive Status: No family/caregiver present to determine baseline cognitive functioning                                 General Comments: Pt with slow processing, requires multimodal cues for direction following        Exercises     Shoulder Instructions       General Comments      Pertinent Vitals/ Pain       Pain Assessment: No/denies pain  Home Living  Prior Functioning/Environment              Frequency  Min 2X/week        Progress Toward Goals  OT Goals(current goals can now be found in the care plan section)  Progress towards OT goals: Progressing toward goals  Acute Rehab OT Goals Patient Stated Goal: to get better and go home OT Goal Formulation: With patient Time For Goal Achievement: 04/21/17 Potential to Achieve Goals: Good  Plan Discharge plan remains appropriate    Co-evaluation    PT/OT/SLP Co-Evaluation/Treatment: Yes Reason for Co-Treatment: For patient/therapist safety;To address functional/ADL transfers   OT goals addressed during session: ADL's and self-care      AM-PAC PT "6 Clicks" Daily Activity     Outcome  Measure   Help from another person eating meals?: A Lot Help from another person taking care of personal grooming?: A Lot Help from another person toileting, which includes using toliet, bedpan, or urinal?: Total Help from another person bathing (including washing, rinsing, drying)?: Total Help from another person to put on and taking off regular upper body clothing?: Total Help from another person to put on and taking off regular lower body clothing?: Total 6 Click Score: 8    End of Session Equipment Utilized During Treatment: Oxygen;Gait belt;Rolling walker(4L)  OT Visit Diagnosis: Other abnormalities of gait and mobility (R26.89);Muscle weakness (generalized) (M62.81)   Activity Tolerance Patient tolerated treatment well   Patient Left in chair;with call bell/phone within reach;with chair alarm set   Nurse Communication          Time: 8588-5027 OT Time Calculation (min): 31 min  Charges: OT General Charges $OT Visit: 1 Visit OT Treatments $Self Care/Home Management : 8-22 mins  04/11/2017 Destiny [name], Destiny [name] Pager: [phone] Werner Lean Haze Boyden 04/11/2017, 4:45 PM

## 2017-04-11 NOTE — Progress Notes (Signed)
RT placed pt on BIPAP. Vital signs are stable and pt tolerating well. RT to cont to monitor.

## 2017-04-11 NOTE — Progress Notes (Signed)
Background: 76 y.o.year-old w/complicated PMH DM, MO, h/o non-Hodgkins's lymphoma, chronic systolic/diastolic HF with Right HF, PAF (chronic anticoagulation), OHS/OSA on NIMV, pacer/ICD, CAD prior CABG, HLD, anemia on Aranesp. Known advanced CKD Stage 5 (GFR baseline 12-13 at last [facility] labs 02/19/17) followed by Dr. Marval Regal. Multiple prior AKI on CKD usually cardiorenal. Admitted 03/30/17 w/ progressive SOB, bilateral pleural effusions on CXR + R PNA. R thoracentesis of 900 ml on 11/19. Failed diuretics. HD initiated for new ESRD  1. New ESRDw/volume overload and progressive dyspnea (weight down 22 pounds) 1. [facility]/Dr Early has seen( for AVF &has left ICD so will need to be right side).Optimize volume status 2.  For outpt TTS at [facility] 3. Supporting BP with midodrine pre and mid 4. Plan HD on hold a couple of days, give ativan pre treatment, decide about continuation but I think a lot has happened to her in a short period of time 2. Systolic (EF 66-29) and diastolic HF with ECFV overload, post thoracentesis improved aeration-volume improved 4. 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 Acute on chronic hypoxic resp failure. Multifactorial.  8. Anemia- 2/2 CKD. s/p Aranesp 60 (gets Q4weeks at [facility]) on 03/27/17 and recent dose Feraheme.  9.H/o Non-Hodgkins lymphoma   Subjective: Interval History: Rough couple of days, will think about plans re: future HD.  Some anxiety and use to take ativan.  Objective: Vital signs in last 24 hours: Temp:  [97.8 F (36.6 C)-98.8 F (37.1 C)] 98.8 F (37.1 C) (11/29 0534) Pulse Rate:  [65-76] 70 (11/29 0840) Resp:  [15-24] 19 (11/29 0534) BP: (107-162)/(30-68) 127/50 (11/29 0840) SpO2:  [91 %-100 %] 91 % (11/29 0822) FiO2 (%):  [40 %] 40 % (11/29 0308) Weight:  [96.6 kg (212 lb 15.4 oz)-101.3 kg (223 lb 5.2 oz)] 96.6 kg (212 lb 15.4 oz) (11/29 0534) Weight change: -0.7 kg (-8.7 oz)  Intake/Output from previous  day: 11/28 0701 - 11/29 0700 In: 0  Out: 3004  Intake/Output this shift: No intake/output data recorded.  General appearance: alert and cooperative Resp: clear to auscultation bilaterally GI: soft, non-tender; bowel sounds normal; no masses,  no organomegaly Extremities: edema less edema in UEs and LEs  Lab Results: Recent Labs    04/10/17 0456 04/11/17 0310  WBC 6.6 6.9  HGB 10.1* 10.0*  HCT 31.9* 31.1*  PLT 63* 58*   BMET:  Recent Labs    04/10/17 0456 04/11/17 0310  NA 133* 133*  K 4.1 3.6  CL 99* 99*  CO2 25 26  GLUCOSE 110* 94  BUN 48* 26*  CREATININE 2.76* 2.04*  CALCIUM 8.2* 7.9*   No results for input(s): PTH in the last 72 hours. Iron Studies: No results for input(s): IRON, TIBC, TRANSFERRIN, FERRITIN in the last 72 hours. Studies/Results: Ir Fluoro Guide Cv Line Right  Result Date: 04/10/2017 INDICATION: End-stage renal disease. In need of tunneled dialysis catheter placement for continuation of dialysis. EXAM: TUNNELED CENTRAL VENOUS HEMODIALYSIS CATHETER PLACEMENT WITH ULTRASOUND AND FLUOROSCOPIC GUIDANCE MEDICATIONS: Ancef 2 gm IV . The antibiotic was given in an appropriate time interval prior to skin puncture. ANESTHESIA/SEDATION: Versed 1 mg IV; Fentanyl 50 mcg IV; Moderate Sedation Time:  12 minutes The patient was continuously monitored during the procedure by the interventional radiology nurse under my direct supervision. FLUOROSCOPY TIME:  Fluoroscopy Time: 30 seconds (8 mGy). COMPLICATIONS: None immediate. PROCEDURE: Informed written consent was obtained from the patient after a discussion of the risks, benefits, and alternatives to treatment. Questions  regarding the procedure were encouraged and answered. The right neck and chest were prepped with chlorhexidine in a sterile fashion, and a sterile drape was applied covering the operative field. Maximum barrier sterile technique with sterile gowns and gloves were used for the procedure. A timeout was  performed prior to the initiation of the procedure. After creating a small venotomy incision, a micropuncture kit was utilized to access the internal jugular vein. Real-time ultrasound guidance was utilized for vascular access including the acquisition of a permanent ultrasound image documenting patency of the accessed vessel. The microwire was utilized to measure appropriate catheter length. A stiff Glidewire was advanced to the level of the IVC and the micropuncture sheath was exchanged for a peel-away sheath. A palindrome tunneled hemodialysis catheter measuring 19 cm from tip to cuff was tunneled in a retrograde fashion from the anterior chest wall to the venotomy incision. The catheter was then placed through the peel-away sheath with tips ultimately positioned within the superior aspect of the right atrium. Final catheter positioning was confirmed and documented with a spot radiographic image. The catheter aspirates and flushes normally. The catheter was flushed with appropriate volume heparin dwells. The catheter exit site was secured with a 0-Prolene retention suture. The venotomy incision was closed with an interrupted 4-0 Vicryl, Dermabond and Steri-strips. Dressings were applied. The patient tolerated the procedure well without immediate post procedural complication. IMPRESSION: Successful placement of 19 cm tip to cuff tunneled hemodialysis catheter via the right internal jugular vein with tips terminating within the superior aspect of the right atrium. The catheter is ready for immediate use. Electronically Signed   By: Sandi Mariscal M.D.   On: 04/10/2017 09:03   Ir US Guide Vasc Access Right  Result Date: 04/10/2017 INDICATION: End-stage renal disease. In need of tunneled dialysis catheter placement for continuation of dialysis. EXAM: TUNNELED CENTRAL VENOUS HEMODIALYSIS CATHETER PLACEMENT WITH ULTRASOUND AND FLUOROSCOPIC GUIDANCE MEDICATIONS: Ancef 2 gm IV . The antibiotic was given in an  appropriate time interval prior to skin puncture. ANESTHESIA/SEDATION: Versed 1 mg IV; Fentanyl 50 mcg IV; Moderate Sedation Time:  12 minutes The patient was continuously monitored during the procedure by the interventional radiology nurse under my direct supervision. FLUOROSCOPY TIME:  Fluoroscopy Time: 30 seconds (8 mGy). COMPLICATIONS: None immediate. PROCEDURE: Informed written consent was obtained from the patient after a discussion of the risks, benefits, and alternatives to treatment. Questions regarding the procedure were encouraged and answered. The right neck and chest were prepped with chlorhexidine in a sterile fashion, and a sterile drape was applied covering the operative field. Maximum barrier sterile technique with sterile gowns and gloves were used for the procedure. A timeout was performed prior to the initiation of the procedure. After creating a small venotomy incision, a micropuncture kit was utilized to access the internal jugular vein. Real-time ultrasound guidance was utilized for vascular access including the acquisition of a permanent ultrasound image documenting patency of the accessed vessel. The microwire was utilized to measure appropriate catheter length. A stiff Glidewire was advanced to the level of the IVC and the micropuncture sheath was exchanged for a peel-away sheath. A palindrome tunneled hemodialysis catheter measuring 19 cm from tip to cuff was tunneled in a retrograde fashion from the anterior chest wall to the venotomy incision. The catheter was then placed through the peel-away sheath with tips ultimately positioned within the superior aspect of the right atrium. Final catheter positioning was confirmed and documented with a spot radiographic image. The catheter aspirates and  flushes normally. The catheter was flushed with appropriate volume heparin dwells. The catheter exit site was secured with a 0-Prolene retention suture. The venotomy incision was closed with an  interrupted 4-0 Vicryl, Dermabond and Steri-strips. Dressings were applied. The patient tolerated the procedure well without immediate post procedural complication. IMPRESSION: Successful placement of 19 cm tip to cuff tunneled hemodialysis catheter via the right internal jugular vein with tips terminating within the superior aspect of the right atrium. The catheter is ready for immediate use. Electronically Signed   By: Sandi Mariscal M.D.   On: 04/10/2017 09:03    Scheduled: . amiodarone  200 mg Oral Daily  . atorvastatin  10 mg Oral q1800  . carvedilol  6.25 mg Oral BID WC  . darbepoetin (ARANESP) injection - DIALYSIS  60 mcg Intravenous Q Fri-HD  . escitalopram  10 mg Oral Daily  . feeding supplement (NEPRO CARB STEADY)  237 mL Oral BID BM  . gabapentin  100 mg Oral QHS  . Gerhardt's butt cream   Topical TID  . heparin subcutaneous  5,000 Units Subcutaneous Q8H  . insulin aspart  0-9 Units Subcutaneous TID WC  . insulin NPH Human  10 Units Subcutaneous QAC breakfast  . ipratropium-albuterol  3 mL Nebulization Q6H  . levothyroxine  88 mcg Oral QAC breakfast  . loperamide  2 mg Oral Daily  . sodium chloride flush  3 mL Intravenous Q12H     LOS: 13 days   Estanislado Emms 04/11/2017,11:40 AM

## 2017-04-11 NOTE — Progress Notes (Signed)
Nutrition Follow-up  DOCUMENTATION CODES:   Obesity unspecified  INTERVENTION:    Increase Nepro to TID, each supplement provides 425 kcal and 19 grams protein  NUTRITION DIAGNOSIS:   Inadequate oral intake related to poor appetite as evidenced by per patient/family report.  Ongoing  GOAL:   Patient will meet greater than or equal to 90% of their needs  Unmet  MONITOR:   PO intake, Supplement acceptance, Skin  ASSESSMENT:   76 yo female with PMH of carotid stenosis, LBBB, HLD, HTN, CHF, CAD, cardiomyopathy, retinopathy, DM-2, hypothyroidism, NHL, OSA, PAF, morbid obesity, pacemaker, vitamin D deficiency, CKD who was admitted on 11/16 with dyspnea, decreased UOP, CHF exacerbation.  PO intake remains poor. Patient is consuming </= 25% of meals. She is being offered Nepro Shakes BID between meals. NPO tomorrow for potential procedure.   Patient is deciding about continuation of HD. Labs reviewed. Sodium 133 (L) Medications reviewed. Weight down 30 lbs since admission with fluid removal from HD I/O - 7.5 L since admission  Diet Order:  Diet renal/carb modified with fluid restriction Diet-HS Snack? Nothing; Room service appropriate? Yes; Fluid consistency: Thin Diet NPO time specified Except for: Sips with Meds  EDUCATION NEEDS:   Not appropriate for education at this time  Skin:  Skin Assessment: Skin Integrity Issues: Skin Integrity Issues:: Stage I Stage I: buttocks  Last BM:  11/29  Height:   Ht Readings from Last 1 Encounters:  03/29/17 5\' 4"  (1.626 m)    Weight:   Wt Readings from Last 1 Encounters:  04/11/17 212 lb 15.4 oz (96.6 kg)   04/05/17 242 lb 11.6 oz (110.1 kg)    Ideal Body Weight:  54.5 kg  BMI:  Body mass index is 36.56 kg/m.  Estimated Nutritional Needs:   Kcal:  2000-2200  Protein:  90-105 gm  Fluid:  1.2 L   Molli Barrows, RD, LDN, [place] Pager [phone] After Hours Pager [phone]

## 2017-04-11 NOTE — Progress Notes (Signed)
[place] for Heparin Indication: atrial fibrillation   Allergies  Allergen Reactions  . Sulfonamide Derivatives Other (See Comments)    UNSPECIFIED REACTION OF CHILDHOOD [per Mother]    Patient Measurements: Height: 5\' 4"  (162.6 cm) Weight: 212 lb 15.4 oz (96.6 kg) IBW/kg (Calculated) : 54.7  Heparin Dosing weight: 80 kg  Vital Signs: Temp: 97.8 F (36.6 C) (11/29 1404) Temp Source: Oral (11/29 1404) BP: 136/58 (11/29 1404) Pulse Rate: 74 (11/29 1404)  Labs: Recent Labs    04/09/17 0500 04/09/17 0549 04/10/17 0456 04/11/17 0310  HGB 10.3*  --  10.1* 10.0*  HCT 32.9*  --  31.9* 31.1*  PLT 63*  --  63* 58*  CREATININE  --  2.70* 2.76* 2.04*    Estimated Creatinine Clearance: 27.3 mL/min (A) (by C-G formula based on SCr of 2.04 mg/dL (H)).    Assessment: 76 year old female admitted with shortness of breath. She is on chronic anticoagulation with Coumadin for atrial fibrillation and history of a mural thrombus. Coumadin was being held since INR has been supratherapeutic and Vit K 5 mg given in anticipation for right tunneled cath procedure.   Last INR 1.44 on 11/25  On SQ heparin currently with TCP  Goal of Therapy:  INR 2 - 3 Monitor platelets by anticoagulation protocol: Yes   Plan:  Continue sq hep Warfarin 5 mg x 1 Daily INR  Levester Fresh, PharmD, BCPS, BCCCP Clinical Pharmacist Clinical phone for 04/11/2017 from 7a-3:30p: [phone] If after 3:30p, please call main pharmacy at: [phone] 04/11/2017 5:38 PM

## 2017-04-11 NOTE — Progress Notes (Signed)
Patient came back from dialysis last night.  Dialysis was able to get 3 L out, patient looked very uncomfortable after dialysis, asked if she was in pain, she stated no.  Patient was put on Bipap.  Patient has not IV access, I asked husband that we needed an IV access.  The husband declined this service and would like to wait until he speaks with MD in the am to see what is the next step.  He stated that he was a little upset that everything kept being delayed and that his wife looked so tired with everything we were doing.

## 2017-04-12 ENCOUNTER — Encounter (HOSPITAL_COMMUNITY)
Admission: EM | Disposition: A | Discharge status: Hospice | Payer: Self-pay | Source: Home / Self Care | Attending: Internal Medicine

## 2017-04-12 ENCOUNTER — Inpatient Hospital Stay (HOSPITAL_COMMUNITY): Payer: Medicare Other

## 2017-04-12 DIAGNOSIS — R140 Abdominal distension (gaseous): Secondary | ICD-10-CM

## 2017-04-12 LAB — BASIC METABOLIC PANEL
Anion gap: 10 (ref 5–15)
BUN: 34 mg/dL — AB (ref 6–20)
CHLORIDE: 100 mmol/L — AB (ref 101–111)
CO2: 21 mmol/L — AB (ref 22–32)
CREATININE: 2.7 mg/dL — AB (ref 0.44–1.00)
Calcium: 8.1 mg/dL — ABNORMAL LOW (ref 8.9–10.3)
GFR calc Af Amer: 19 mL/min — ABNORMAL LOW (ref >60)
GFR calc non Af Amer: 16 mL/min — ABNORMAL LOW (ref >60)
Glucose, Bld: 112 mg/dL — ABNORMAL HIGH (ref 65–99)
Potassium: 4.8 mmol/L (ref 3.5–5.1)
Sodium: 131 mmol/L — ABNORMAL LOW (ref 135–145)

## 2017-04-12 LAB — CBC
HEMATOCRIT: 31.6 % — AB (ref 36.0–46.0)
HEMOGLOBIN: 9.9 g/dL — AB (ref 12.0–15.0)
MCH: 28.5 pg (ref 26.0–34.0)
MCHC: 31.3 g/dL (ref 30.0–36.0)
MCV: 91.1 fL (ref 78.0–100.0)
PLATELETS: 49 10*3/uL — AB (ref 150–400)
RBC: 3.47 MIL/uL — AB (ref 3.87–5.11)
RDW: 16.3 % — ABNORMAL HIGH (ref 11.5–15.5)
WBC: 4.2 10*3/uL (ref 4.0–10.5)

## 2017-04-12 LAB — GLUCOSE, CAPILLARY
GLUCOSE-CAPILLARY: 193 mg/dL — AB (ref 65–99)
Glucose-Capillary: 86 mg/dL (ref 65–99)
Glucose-Capillary: 105 mg/dL — ABNORMAL HIGH (ref 65–99)

## 2017-04-12 LAB — PROTIME-INR
INR: 1.12
Prothrombin Time: 14.3 seconds (ref 11.4–15.2)

## 2017-04-12 LAB — HEPARIN LEVEL (UNFRACTIONATED): Heparin Unfractionated: 0.17 IU/mL — ABNORMAL LOW (ref 0.30–0.70)

## 2017-04-12 SURGERY — ARTERIOVENOUS (AV) FISTULA CREATION
Anesthesia: Monitor Anesthesia Care | Laterality: Right

## 2017-04-12 MED ORDER — DARBEPOETIN ALFA 60 MCG/0.3ML IJ SOSY
60.0000 ug | PREFILLED_SYRINGE | INTRAMUSCULAR | Status: DC
  Administered 2017-04-13: 60 ug via INTRAVENOUS

## 2017-04-12 MED ORDER — HEPARIN (PORCINE) IN NACL 100-0.45 UNIT/ML-% IJ SOLN
1500.0000 [IU]/h | INTRAMUSCULAR | Status: DC
  Administered 2017-04-12: 1000 [IU]/h via INTRAVENOUS
  Administered 2017-04-13: 1200 [IU]/h via INTRAVENOUS
  Administered 2017-04-14: 1300 [IU]/h via INTRAVENOUS
  Administered 2017-04-15: 1500 [IU]/h via INTRAVENOUS
  Filled 2017-04-12 (×3): qty 250

## 2017-04-12 MED ORDER — LORAZEPAM 0.5 MG PO TABS
0.5000 mg | ORAL_TABLET | ORAL | Status: DC | PRN
  Administered 2017-04-13: 0.5 mg via ORAL

## 2017-04-12 MED ORDER — WARFARIN SODIUM 5 MG PO TABS
5.0000 mg | ORAL_TABLET | Freq: Once | ORAL | Status: AC
  Administered 2017-04-12: 5 mg via ORAL
  Filled 2017-04-12: qty 1

## 2017-04-12 NOTE — Progress Notes (Addendum)
Late Entry Note for 11/29  Pt significantly deconditioned, but worked hard throughout the session.  Pt needing maximal assist of 1 or 2 persons for most mobility.   04/11/17 2208  PT Visit Information  Last PT Received On 04/11/17  Assistance Needed +2  PT/OT/SLP Co-Evaluation/Treatment Yes  Reason for Co-Treatment Complexity of the patient's impairments (multi-system involvement)  PT goals addressed during session Mobility/safety with mobility  History of Present Illness Pt is a 75 y.o. female who presented to the ED with 2 day history of worsening shortness of breath. Found to have B pleural effusions and volume overload. Attempted diuresis but ultimately required HD, new dx ESRD.  PMH significant for CHF with EF 20% with AICD, COPD on home oxygen of 2L, CKD stage IV, obesity, HTN, anemia, pressure injury, and diabetes.   Subjective Data  Patient Stated Goal to get better and go home  Precautions  Precautions Fall  Precaution Comments Watch O2 saturation  Pain Assessment  Pain Assessment Faces  Faces Pain Scale 2  Pain Location back/sacrum with bed mobility  Pain Descriptors / Indicators Discomfort  Pain Intervention(s) Monitored during session  Cognition  Arousal/Alertness Awake/alert  Behavior During Therapy Flat affect  Overall Cognitive Status No family/caregiver present to determine baseline cognitive functioning  General Comments Pt with slow processing, requires multimodal cues for direction following  Bed Mobility  Overal bed mobility Needs Assistance  Bed Mobility Rolling;Sidelying to Sit  Rolling Max assist  Sidelying to sit +2 for physical assistance;Max assist  General bed mobility comments cues for sequencing and use of rail and bed pad, assist for all aspects  Transfers  Overall transfer level Needs assistance  Equipment used Rolling walker (2 wheeled)  Transfers Sit to/from Stand  Sit to Stand +2 physical assistance;Mod assist;Max assist  General transfer  comment cues for hand placement, assist to rise and steady  Ambulation/Gait  Ambulation/Gait assistance Max assist;+2 physical assistance  Ambulation Distance (Feet) 5 Feet  Assistive device Rolling walker (2 wheeled)  Gait Pattern/deviations Step-to pattern;Decreased step length - right;Decreased step length - left  General Gait Details small unsteady steps with significant support  Gait velocity interpretation Below normal speed for age/gender  Balance  Overall balance assessment Needs assistance  Sitting-balance support No upper extremity supported  Sitting balance-Leahy Scale Fair  Sitting balance - Comments worked on mid range reaching  Standing balance-Leahy Scale Poor  Standing balance comment requires B UE support and min to mod assist  PT - End of Session  Activity Tolerance Patient limited by fatigue  Patient left with call bell/phone within reach;with bed alarm set;in bed  Nurse Communication Mobility status  PT - Assessment/Plan  PT Plan Current plan remains appropriate  PT Visit Diagnosis Unsteadiness on feet (R26.81);Muscle weakness (generalized) (M62.81);Difficulty in walking, not elsewhere classified (R26.2)  PT Frequency (ACUTE ONLY) Min 3X/week  Follow Up Recommendations SNF;Supervision/Assistance - 24 hour  PT equipment Wheelchair (measurements PT);Wheelchair cushion (measurements PT)  AM-PAC PT "6 Clicks" Daily Activity Outcome Measure  Difficulty turning over in bed (including adjusting bedclothes, sheets and blankets)? 1  Difficulty moving from lying on back to sitting on the side of the bed?  1  Difficulty sitting down on and standing up from a chair with arms (e.g., wheelchair, bedside commode, etc,.)? 1  Help needed moving to and from a bed to chair (including a wheelchair)? 2  Help needed walking in hospital room? 2  Help needed climbing 3-5 steps with a railing?  1  6 Click  Score 8  Mobility G Code  CM  PT Goal Progression  Progress towards PT goals  Progressing toward goals  Acute Rehab PT Goals  PT Goal Formulation With patient  Time For Goal Achievement 04/21/17  Potential to Achieve Goals Fair  PT Time Calculation  PT Start Time (ACUTE ONLY) 1558  PT Stop Time (ACUTE ONLY) 1635  PT Time Calculation (min) (ACUTE ONLY) 37 min  PT General Charges  $$ ACUTE PT VISIT 1 Visit   04/12/2017  Donnella Sham, PT [phone]  (pager)

## 2017-04-12 NOTE — Progress Notes (Signed)
[name], female   DOB: 25-Apr-1941, 76 y.o.   MRN: [id number] Making some improvement from a medical standpoint.  Coumadin has been restarted.  Can plan right arm fistula Destiny [name] next week if still hospitalized or as an outpatient but will have to stop Coumadin

## 2017-04-12 NOTE — Progress Notes (Signed)
Background: 76 y.o.year-old w/complicated PMH DM, MO, h/o non-Hodgkins's lymphoma, chronic systolic/diastolic HF with Right HF, PAF (chronic anticoagulation), OHS/OSA on NIMV, pacer/ICD, CAD prior CABG, HLD, anemia on Aranesp. Known advanced CKD Stage 5 (GFR baseline 12-13 at last [facility] labs 02/19/17) followed by Dr. Marval Regal. Multiple prior AKI on CKD usually cardiorenal. Admitted 03/30/17 w/ progressive SOB, bilateral pleural effusions on CXR + R PNA. R thoracentesis of 900 ml on 11/19. Failed diuretics. HD initiated for new ESRD  1. New ESRDw/volume overload and progressive dyspnea (weight down 22 pounds) 1. [facility]/Dr Early has seen( for AVF &has left ICD so will need to be right side).Optimize volume status 2.  For outpt TTS at [place] 3. Supporting BP with midodrine pre and mid 4. They are agreeable to HD.  Will do Sat. 2. Systolic (EF 10-93) and diastolic HF with ECFV overload,post thoracentesis improved aeration-volume improved 4. 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 Acute on chronic hypoxic resp failure. Multifactorial.  8. Anemia- 2/2 CKD. s/p Aranesp 60 (gets Q4weeks at [facility]) on 03/27/17 and recent dose Feraheme.  9.H/o Non-Hodgkins lymphoma   Subjective: Interval History: Husband upset about needle sticks and cancellation of surgery  Objective: Vital signs in last 24 hours: Temp:  [97.7 F (36.5 C)-98.9 F (37.2 C)] 97.7 F (36.5 C) (11/30 1149) Pulse Rate:  [70-76] 70 (11/30 1149) Resp:  [13-21] 18 (11/30 1149) BP: (103-136)/(44-58) 131/56 (11/30 1149) SpO2:  [90 %-100 %] 98 % (11/30 1149) FiO2 (%):  [40 %] 40 % (11/30 0552) Weight:  [100.2 kg (220 lb 14.4 oz)] 100.2 kg (220 lb 14.4 oz) (11/30 0552) Weight change: -1.1 kg (-6.8 oz)  Intake/Output from previous day: 11/29 0701 - 11/30 0700 In: 360 [P.O.:360] Out: -  Intake/Output this shift: No intake/output data recorded.  General appearance: more alert today Chest wall: no  tenderness Cardio: regular rate and rhythm, S1, S2 normal, no murmur, click, rub or gallop Gen weakness, less edema  Lab Results: Recent Labs    04/11/17 0310 04/12/17 0904  WBC 6.9 4.2  HGB 10.0* 9.9*  HCT 31.1* 31.6*  PLT 58* 49*   BMET:  Recent Labs    04/11/17 0310 04/12/17 0736  NA 133* 131*  K 3.6 4.8  CL 99* 100*  CO2 26 21*  GLUCOSE 94 112*  BUN 26* 34*  CREATININE 2.04* 2.70*  CALCIUM 7.9* 8.1*   No results for input(s): PTH in the last 72 hours. Iron Studies: No results for input(s): IRON, TIBC, TRANSFERRIN, FERRITIN in the last 72 hours. Studies/Results: No results found.  Scheduled: . amiodarone  200 mg Oral Daily  . atorvastatin  10 mg Oral q1800  . carvedilol  6.25 mg Oral BID WC  . darbepoetin (ARANESP) injection - DIALYSIS  60 mcg Intravenous Q Fri-HD  . escitalopram  10 mg Oral Daily  . feeding supplement (NEPRO CARB STEADY)  237 mL Oral TID BM  . gabapentin  300 mg Oral QHS  . Gerhardt's butt cream   Topical TID  . heparin subcutaneous  5,000 Units Subcutaneous Q8H  . insulin aspart  0-9 Units Subcutaneous TID WC  . insulin NPH Human  10 Units Subcutaneous QAC breakfast  . ipratropium-albuterol  3 mL Nebulization Q6H  . levothyroxine  88 mcg Oral QAC breakfast  . loperamide  2 mg Oral Daily  . sodium chloride flush  3 mL Intravenous Q12H  . Warfarin - Pharmacist Dosing Inpatient   Does not apply [phone]  LOS: 14 days   Estanislado Emms 04/12/2017,12:51 PM

## 2017-04-12 NOTE — Progress Notes (Addendum)
PROGRESS NOTE    Destiny [name]  EQA:[id number] DOB: [birth year] DOA: 03/29/2017 PCP: Mosie Lukes, MD    Brief Narrative:  76 yo F CHF EF 20% with AICD, COPD on home oxygen of 2 L, CKD stage IV, anticoagulation with Coumadin with prior LV mural thrombus, and DM who presents with few days dyspnea.   Treated with 4 days ceftriaxone and azithromycin, no improvement.  Attempted diuresis but very little urine output, so started dialysis and tolerating well.  Pt seen and examined with her husband at her bedside. She denies any chest pain, palpitations or dyspnea while laying still in the bed. The writer spoke with the vascular team. Plan is to possibly obtain fistula in the outpatient setting. Not a good candidate at this time due to very poor overall health. Patient back on coumadin and being bridged with pharmacy management. Patient is very deconditioned and get dyspneic even with minimal exertion as with sitting up in bed. Palliative care consulted.   Assessment & Plan:  Principal Problem:   Acute on chronic combined systolic and diastolic CHF (congestive heart failure) (HCC) Active Problems:   Diabetes mellitus type 2 with retinopathy (HCC)   Hyperlipidemia   Essential hypertension   Automatic implantable cardioverter-defibrillator in situ   Hypothyroidism   Anemia   Morbid obesity (HCC)   COPD, severe (HCC)   CKD (chronic kidney disease) stage 4, GFR 15-29 ml/min ([place])   Community acquired pneumonia   Acute hypoxemic respiratory failure (HCC)   Pressure injury of skin   Acute on chronic hypoxic respiratory failure most likely 2/2 to chronic systolic congestive heart failure EF 20%, pleural effusion, fluid overload -Failed diuretics, elected to begin HD in consultation with Nephrology.  -respiratory status improved after dialysis -Temp cath in LIJ placed.   -HD per Nephrology -Not a good candidate for fistula placement at this time. -right thoracentesis with  900 cc body fluid  removed 04/02/17. Pleural fluid with elevated LDH, low protein.  Meets Light's criteria, but suspect this is not parapneumonic. -continue O2 supplementation to maintain O2 saturation 92% or greater -Bipap at night  ESRD now on HD -HD per Nephrology -MBD w/u, CLIP, Aranesp per Nephrology -Not a good candidate for fistula placement per vascular surgery team -Possible fistula placement in the outpatient setting  Atrial fibrillation CHADS2Vasc 4 at least, but never stroke, LV thrombus years ago. -warfarin, amiodarone. -resume warfarin, to bridge with heparin drip -Pharmacy management of heparin drip appreciated -Continue amiodarone, Coreg -INR 1.12 -cbc, INR am  Subtherapeutic INR -INR 1.12 (04/12/17) -coumadin bridged with heparin drip -pharmacy management of coumadin and heparin drip -INR am  Chronic thrombocytopenia -plt 49 from 58k -progressively getting worse -no sign of gross bleeding -if continues to trend down consult hematology -peripheral smear ordered -In setting of NHL in remission, followed by Dr. Benay Spice.   -No cirrhosis on last CT imaging.  -cbc am  Abdominal distention -unclear etiology -complete abdominal ultrasound 04/12/17 -cmp am to assess LFTs  Normocytic anemia -most likely 2/2 to anemia of chronic disease with ESRD -hg stable at 9. Baseline 9-10 -no sign of overt bleeding -cbc am  Acute on chronic systolic CHF -As above -Continue aspirin, statin, BB, HD  Hyponatremia -Improved -Na+ 131 from 133 -fluid restriction -renal diet with 1200 cc fluid restriction -BMP am  Deconditioning/ambulatory dysfunction -PT/OT evaluate and treat -OOB to chair with every shift -fall precaution  Suspected Community Acquired pneumonia, present on admission Completed course of antibiotics.    Chronic COPD -  Continue bronchodilators PRN  Diabetes -Continu NPH -Continue SSI -bmp  Hypothyroidism -Continue levothyroxine  Other medications -Continue  Bentyl -Continue Lexapro -Continue gabapentin, dose decreased now on HD  Elevated troponin Heart stretch in ESRD, doubt ACS Peaked at 0.04 (04/05/17)  DVT prophylaxis: heparin drip bridging coumadin Code Status: FULL Family Communication: Husband at bedside. All questions answered to his satisfaction. Disposition Plan: will stay another midnight to improve INR.   Consultants:   Nephrology, EP, IR  Procedures:   Thoracentesis  HD  Antimicrobials:   Ceftraixone and azithromycin, completed 4 days    Subjective: Appears listless and moribund, but states she feels fine/better.  No new chest pain, fever, cough, sputum, confusion.  Objective: Vitals:   04/11/17 2023 04/11/17 2043 04/11/17 2300 04/12/17 0552  BP:  (!) 131/50 (!) 103/44 (!) 125/58  Pulse:  70 70 76  Resp:  20 (!) 21 13  Temp:  98.2 F (36.8 C)  98.9 F (37.2 C)  TempSrc:  Oral  Axillary  SpO2: 95% 94% 96%   Weight:    100.2 kg (220 lb 14.4 oz)  Height:        Intake/Output Summary (Last 24 hours) at 04/12/2017 0818 Last data filed at 04/11/2017 2230 Gross per 24 hour  Intake 360 ml  Output -  Net 360 ml   Filed Weights   04/10/17 2032 04/11/17 0534 04/12/17 0552  Weight: 98.3 kg (216 lb 11.4 oz) 96.6 kg (212 lb 15.4 oz) 100.2 kg (220 lb 14.4 oz)    Examination: General appearance: Elderly obese adult female, interactive, oriented just appears extremely tired.   HEENT: Anicteric, conjunctiva pink, lids and lashes normal. No more periorbital puffiness.  No nasal deformity, discharge, epistaxis.  Lips moist.   Skin: Warm and dry.  No jaundice.  No suspicious rashes or lesions.  Periorbital edema improved. Cardiac: RRR, nl S1-S2, SEM noted.  Capillary refill is brisk.  JVP not visible.  Trace LE edema.  Radia pulses 2+ and symmetric. Respiratory: Very labored breathing, appears tired.  Diminshed thoughout, no wheezes. Abdomen: Abdomen soft.  No TTP. No ascites, distension, hepatosplenomegaly.     MSK: No deformities or effusions. Neuro: Oriented, globally very weak.  Facies symmetric, moves upper exttremities equally. Psych: Affect flat.    Data Reviewed: I have personally reviewed following labs and imaging studies:  CBC: Recent Labs  Lab 04/07/17 0332 04/08/17 2302 04/09/17 0500 04/10/17 0456 04/11/17 0310  WBC 6.9 7.8 7.1 6.6 6.9  HGB 11.1* 10.7* 10.3* 10.1* 10.0*  HCT 33.9* 33.5* 32.9* 31.9* 31.1*  MCV 89.4 90.5 92.2 91.1 90.7  PLT 85* 96* 63* 63* 58*   Basic Metabolic Panel: Recent Labs  Lab 04/06/17 0419 04/07/17 0332 04/09/17 0004 04/09/17 0549 04/10/17 0456 04/11/17 0310  NA 130* 133* 134* 133* 133* 133*  K 4.1 3.9 4.2 4.3 4.1 3.6  CL 99* 100* 99* 99* 99* 99*  CO2 '24 24 28 27 25 26  '$ GLUCOSE 173* 87 110* 131* 110* 94  BUN 70* 44* 33* 44* 48* 26*  CREATININE 2.71* 2.24* 2.04* 2.70* 2.76* 2.04*  CALCIUM 7.8* 7.7* 7.8* 8.2* 8.2* 7.9*  PHOS 4.2 3.1 2.6 3.8 3.7  --    GFR: Estimated Creatinine Clearance: 27.8 mL/min (A) (by C-G formula based on SCr of 2.04 mg/dL (H)). Liver Function Tests: Recent Labs  Lab 04/06/17 0419 04/07/17 0332 04/09/17 0004 04/09/17 0549 04/10/17 0456  ALBUMIN 2.3* 2.5* 2.8* 2.5* 2.4*   No results for input(s): LIPASE, AMYLASE in the  last 168 hours. No results for input(s): AMMONIA in the last 168 hours. Coagulation Profile: Recent Labs  Lab 04/06/17 0419 04/07/17 0332 04/12/17 0736  INR 1.54 1.44 1.12   Cardiac Enzymes: Recent Labs  Lab 04/05/17 1313 04/05/17 1435 04/05/17 2301  TROPONINI 0.03* 0.03* 0.04*   BNP (last 3 results) No results for input(s): PROBNP in the last 8760 hours. HbA1C: No results for input(s): HGBA1C in the last 72 hours. CBG: Recent Labs  Lab 04/11/17 0810 04/11/17 1136 04/11/17 1714 04/11/17 2216 04/12/17 0747  GLUCAP 103* 154* 152* 140* 86   Lipid Profile: No results for input(s): CHOL, HDL, LDLCALC, TRIG, CHOLHDL, LDLDIRECT in the last 72 hours. Thyroid Function  Tests: No results for input(s): TSH, T4TOTAL, FREET4, T3FREE, THYROIDAB in the last 72 hours. Anemia Panel: No results for input(s): VITAMINB12, FOLATE, FERRITIN, TIBC, IRON, RETICCTPCT in the last 72 hours. Urine analysis:    Component Value Date/Time   COLORURINE YELLOW 03/29/2017 2348   APPEARANCEUR HAZY (A) 03/29/2017 2348   LABSPEC 1.013 03/29/2017 2348   PHURINE 5.0 03/29/2017 2348   GLUCOSEU NEGATIVE 03/29/2017 2348   GLUCOSEU NEGATIVE 06/25/2016 1002   HGBUR NEGATIVE 03/29/2017 2348   HGBUR large 05/17/2008 1325   BILIRUBINUR NEGATIVE 03/29/2017 [place] 03/29/2017 2348   PROTEINUR NEGATIVE 03/29/2017 2348   UROBILINOGEN 0.2 06/25/2016 1002   NITRITE NEGATIVE 03/29/2017 2348   LEUKOCYTESUR LARGE (A) 03/29/2017 2348   Sepsis Labs: '@LABRCNTIP'$ (procalcitonin:4,lacticidven:4)  ) Recent Results (from the past 240 hour(s))  Culture, body fluid-bottle     Status: None   Collection Time: 04/02/17 12:59 PM  Result Value Ref Range Status   Specimen Description FLUID RIGHT PLEURAL  Final   Special Requests NONE  Final   Culture NO GROWTH 5 DAYS  Final   Report Status 04/07/2017 FINAL  Final  Gram stain     Status: None   Collection Time: 04/02/17 12:59 PM  Result Value Ref Range Status   Specimen Description FLUID RIGHT PLEURAL  Final   Special Requests NONE  Final   Gram Stain NO WBC SEEN NO ORGANISMS SEEN   Final   Report Status 04/02/2017 FINAL  Final         Radiology Studies: Ir Fluoro Guide Cv Line Right  Result Date: 04/10/2017 INDICATION: End-stage renal disease. In need of tunneled dialysis catheter placement for continuation of dialysis. EXAM: TUNNELED CENTRAL VENOUS HEMODIALYSIS CATHETER PLACEMENT WITH ULTRASOUND AND FLUOROSCOPIC GUIDANCE MEDICATIONS: Ancef 2 gm IV . The antibiotic was given in an appropriate time interval prior to skin puncture. ANESTHESIA/SEDATION: Versed 1 mg IV; Fentanyl 50 mcg IV; Moderate Sedation Time:  12 minutes  The patient was continuously monitored during the procedure by the interventional radiology nurse under my direct supervision. FLUOROSCOPY TIME:  Fluoroscopy Time: 30 seconds (8 mGy). COMPLICATIONS: None immediate. PROCEDURE: Informed written consent was obtained from the patient after a discussion of the risks, benefits, and alternatives to treatment. Questions regarding the procedure were encouraged and answered. The right neck and chest were prepped with chlorhexidine in a sterile fashion, and a sterile drape was applied covering the operative field. Maximum barrier sterile technique with sterile gowns and gloves were used for the procedure. A timeout was performed prior to the initiation of the procedure. After creating a small venotomy incision, a micropuncture kit was utilized to access the internal jugular vein. Real-time ultrasound guidance was utilized for vascular access including the acquisition of a permanent ultrasound image documenting patency of the accessed  vessel. The microwire was utilized to measure appropriate catheter length. A stiff Glidewire was advanced to the level of the IVC and the micropuncture sheath was exchanged for a peel-away sheath. A palindrome tunneled hemodialysis catheter measuring 19 cm from tip to cuff was tunneled in a retrograde fashion from the anterior chest wall to the venotomy incision. The catheter was then placed through the peel-away sheath with tips ultimately positioned within the superior aspect of the right atrium. Final catheter positioning was confirmed and documented with a spot radiographic image. The catheter aspirates and flushes normally. The catheter was flushed with appropriate volume heparin dwells. The catheter exit site was secured with a 0-Prolene retention suture. The venotomy incision was closed with an interrupted 4-0 Vicryl, Dermabond and Steri-strips. Dressings were applied. The patient tolerated the procedure well without immediate post  procedural complication. IMPRESSION: Successful placement of 19 cm tip to cuff tunneled hemodialysis catheter via the right internal jugular vein with tips terminating within the superior aspect of the right atrium. The catheter is ready for immediate use. Electronically Signed   By: Sandi Mariscal M.D.   On: 04/10/2017 09:03   Ir US Guide Vasc Access Right  Result Date: 04/10/2017 INDICATION: End-stage renal disease. In need of tunneled dialysis catheter placement for continuation of dialysis. EXAM: TUNNELED CENTRAL VENOUS HEMODIALYSIS CATHETER PLACEMENT WITH ULTRASOUND AND FLUOROSCOPIC GUIDANCE MEDICATIONS: Ancef 2 gm IV . The antibiotic was given in an appropriate time interval prior to skin puncture. ANESTHESIA/SEDATION: Versed 1 mg IV; Fentanyl 50 mcg IV; Moderate Sedation Time:  12 minutes The patient was continuously monitored during the procedure by the interventional radiology nurse under my direct supervision. FLUOROSCOPY TIME:  Fluoroscopy Time: 30 seconds (8 mGy). COMPLICATIONS: None immediate. PROCEDURE: Informed written consent was obtained from the patient after a discussion of the risks, benefits, and alternatives to treatment. Questions regarding the procedure were encouraged and answered. The right neck and chest were prepped with chlorhexidine in a sterile fashion, and a sterile drape was applied covering the operative field. Maximum barrier sterile technique with sterile gowns and gloves were used for the procedure. A timeout was performed prior to the initiation of the procedure. After creating a small venotomy incision, a micropuncture kit was utilized to access the internal jugular vein. Real-time ultrasound guidance was utilized for vascular access including the acquisition of a permanent ultrasound image documenting patency of the accessed vessel. The microwire was utilized to measure appropriate catheter length. A stiff Glidewire was advanced to the level of the IVC and the micropuncture  sheath was exchanged for a peel-away sheath. A palindrome tunneled hemodialysis catheter measuring 19 cm from tip to cuff was tunneled in a retrograde fashion from the anterior chest wall to the venotomy incision. The catheter was then placed through the peel-away sheath with tips ultimately positioned within the superior aspect of the right atrium. Final catheter positioning was confirmed and documented with a spot radiographic image. The catheter aspirates and flushes normally. The catheter was flushed with appropriate volume heparin dwells. The catheter exit site was secured with a 0-Prolene retention suture. The venotomy incision was closed with an interrupted 4-0 Vicryl, Dermabond and Steri-strips. Dressings were applied. The patient tolerated the procedure well without immediate post procedural complication. IMPRESSION: Successful placement of 19 cm tip to cuff tunneled hemodialysis catheter via the right internal jugular vein with tips terminating within the superior aspect of the right atrium. The catheter is ready for immediate use. Electronically Signed   By: Sandi Mariscal  M.D.   On: 04/10/2017 09:03        Scheduled Meds: . amiodarone  200 mg Oral Daily  . atorvastatin  10 mg Oral q1800  . carvedilol  6.25 mg Oral BID WC  . darbepoetin (ARANESP) injection - DIALYSIS  60 mcg Intravenous Q Fri-HD  . escitalopram  10 mg Oral Daily  . feeding supplement (NEPRO CARB STEADY)  237 mL Oral TID BM  . gabapentin  300 mg Oral QHS  . Gerhardt's butt cream   Topical TID  . heparin subcutaneous  5,000 Units Subcutaneous Q8H  . insulin aspart  0-9 Units Subcutaneous TID WC  . insulin NPH Human  10 Units Subcutaneous QAC breakfast  . ipratropium-albuterol  3 mL Nebulization Q6H  . levothyroxine  88 mcg Oral QAC breakfast  . loperamide  2 mg Oral Daily  . sodium chloride flush  3 mL Intravenous Q12H  . Warfarin - Pharmacist Dosing Inpatient   Does not apply q1800   Continuous Infusions: . sodium  chloride    . cefUROXime (ZINACEF)  IV       LOS: 14 days    Time spent: 25 minutes    Kayleen Memos, MD [facility] Pager [phone]  If 7PM-7AM, please contact night-coverage [email or web address] Password [email or web address] 04/12/2017, 11:34 AM

## 2017-04-12 NOTE — Progress Notes (Signed)
ANTICOAGULATION CONSULT NOTE - Follow Up Consult  Pharmacy Consult for Coumadin and Heparin bridge Indication: atrial fibrillation  Allergies  Allergen Reactions  . Sulfonamide Derivatives Other (See Comments)    UNSPECIFIED REACTION OF CHILDHOOD [per Mother]    Patient Measurements: Height: 5\' 4"  (162.6 cm) Weight: 220 lb 14.4 oz (100.2 kg) IBW/kg (Calculated) : 54.7 Heparin Dosing Weight: 80 kg  Vital Signs: Temp: 97.7 F (36.5 C) (11/30 1149) Temp Source: Oral (11/30 1149) BP: 131/56 (11/30 1149) Pulse Rate: 70 (11/30 1149)  Labs: Recent Labs    04/10/17 0456 04/11/17 0310 04/12/17 0736 04/12/17 0904  HGB 10.1* 10.0*  --  9.9*  HCT 31.9* 31.1*  --  31.6*  PLT 63* 58*  --  49*  LABPROT  --   --  14.3  --   INR  --   --  1.12  --   CREATININE 2.76* 2.04* 2.70*  --     Estimated Creatinine Clearance: 21 mL/min (A) (by C-G formula based on SCr of 2.7 mg/dL (H)).  Assessment:   76 yr old female on Coumadin for atrial fibrillation and history of mural thrombus.   INR therapeutic (2.71) on admit 11/16; INR up to 3.53 on 11/19 and Coumadin held. Vitamin K 5 mg PO given on 11/23 when INR down to 2.68.  IJ line placed 11/23 for dialysis access.  Coumadin had continued on hold, anticipating AVF placement 11/30, but procedure has been postponed, and Coumadin was resumed last night with 5 mg. Has been on sq heparin since 11/28, but now to change to IV heparin.  Hx thrombocytopenia. Platelet count 94 on admit, has trended down to 49 today. No bleeding noted.  Discussed with Dr. Nevada Crane; plan IV heparin as long as platelet count remains >20, or if any s/sx bleeding.    Will begin IV heparin conservatively, without bolus. Last sq dose ~6am today.   INR 1.12 today after Coumadin 5 mg on 11/29.    Coumadin regimen PTA:  2.5 mg daily except 5 mg on Mondays.  Goal of Therapy:  INR 2-3 Heparin level 0.3-0.5 units/ml (lower end of therapeutic range) Monitor platelets by anticoagulation  protocol: Yes   Plan:   Heparin sq discontinued.  Begin IV heparin at 1000 units/hr (~12 units/kg adjusted BW/hr)  Heparin level ~8 hrs after drip begins.  Target low therapeutic heparin levels.  Coumadin 5 mg again today.  Daily heparin level, PT/INR and CBC.  Monitor for an s/sx bleeding.  Arty Baumgartner,  Pager: [phone] 04/12/2017,3:13 PM

## 2017-04-12 NOTE — Progress Notes (Signed)
CSW met with patient's spouse. Spouse discussed concerns about patient's treatment and how starting dialysis has been difficult on patient. Spouse with concerns for patient's ability to tolerate the multiple blood draws and HD treatments. Spouse indicated he and patient are agreeable for patient to continue HD. Spouse aware plan for fistula placement outpatient. Spouse remains agreeable to discharge to [facility]. CSW to follow and support.  Estanislado Emms, [place]

## 2017-04-12 NOTE — Care Management Note (Signed)
Case Management Note  Patient Details  Name: Destiny [name] MRN: [id number] Date of Birth: [birth year]  Subjective/Objective: Pt presented for CHF 2/2 CKD Stage IV. Initiated on IV Diuresis- Failed now New start to HD. Tunneled Cath placed. Planned for [facility]. Clip in Process.                  Action/Plan: CSW following for disposition needs. CM will continue to monitor as well.   Expected Discharge Date:             Expected Discharge Plan:  Skilled Nursing Facility  In-House Referral:  Clinical Social Work  Discharge planning Services  CM Consult  Post Acute Care Choice:  NA Choice offered to:  NA  DME Arranged:  N/A DME Agency:  NA  HH Arranged:  NA HH Agency:  NA  Status of Service:  Completed, signed off  If discussed at [place] of Stay Meetings, dates discussed:    Additional Comments:  Bethena Roys, RN 04/12/2017, 2:44 PM

## 2017-04-12 NOTE — Progress Notes (Signed)
Occupational Therapy Treatment Patient Details Name: Destiny [name] MRN: [id number] DOB: 1940/07/20 Today's Date: 04/12/2017    History of present illness Pt is a 76 y.o. female who presented to the ED with 2 day history of worsening shortness of breath. Found to have B pleural effusions and volume overload. Attempted diuresis but ultimately required HD, new dx ESRD.  PMH significant for CHF with EF 20% with AICD, COPD on home oxygen of 2L, CKD stage IV, obesity, HTN, anemia, pressure injury, and diabetes.    OT comments  Sat EOB with max assist for bed mobility and performed self feeding with foam build up and one grooming task with moderate assist. Husband bedside and stating pt is not at her baseline in cognition.   Follow Up Recommendations  SNF;Supervision/Assistance - 24 hour    Equipment Recommendations       Recommendations for Other Services      Precautions / Restrictions Precautions Precautions: Fall       Mobility Bed Mobility Overal bed mobility: Needs Assistance Bed Mobility: Rolling;Sidelying to Sit;Sit to Sidelying Rolling: Max assist Sidelying to sit: Max assist     Sit to sidelying: Max assist General bed mobility comments: cues for sequencing and use of rail and bed pad, assist for all aspects  Transfers                      Balance Overall balance assessment: Needs assistance   Sitting balance-Leahy Scale: Fair                                     ADL either performed or assessed with clinical judgement   ADL Overall ADL's : Needs assistance/impaired Eating/Feeding: Moderate assistance;Sitting Eating/Feeding Details (indicate cue type and reason): issued foam build up and educated husband in use and to order finger food for pt Grooming: Brushing hair;Moderate assistance;Sitting Grooming Details (indicate cue type and reason): pt fatigues with UE use                                     Vision        Perception     Praxis      Cognition Arousal/Alertness: Awake/alert Behavior During Therapy: Flat affect Overall Cognitive Status: Impaired/Different from baseline Area of Impairment: Orientation;Memory;Attention;Following commands;Problem solving                 Orientation Level: Disoriented to;Time;Situation Current Attention Level: Sustained Memory: Decreased short-term memory Following Commands: Follows one step commands with increased time;Follows one step commands inconsistently(and multimodal cues)     Problem Solving: Slow processing;Decreased initiation;Difficulty sequencing;Requires verbal cues;Requires tactile cues General Comments: husband reports pt is not at her baseline in cognition        Exercises     Shoulder Instructions       General Comments      Pertinent Vitals/ Pain       Pain Assessment: No/denies pain  Home Living                                          Prior Functioning/Environment              Frequency  Min 2X/week  Progress Toward Goals  OT Goals(current goals can now be found in the care plan section)  Progress towards OT goals: Progressing toward goals  Acute Rehab OT Goals Patient Stated Goal: to get better and go home OT Goal Formulation: With patient Time For Goal Achievement: 04/21/17 Potential to Achieve Goals: Good  Plan Discharge plan remains appropriate    Co-evaluation                 AM-PAC PT "6 Clicks" Daily Activity     Outcome Measure   Help from another person eating meals?: A Lot Help from another person taking care of personal grooming?: A Lot Help from another person toileting, which includes using toliet, bedpan, or urinal?: Total Help from another person bathing (including washing, rinsing, drying)?: Total Help from another person to put on and taking off regular upper body clothing?: Total Help from another person to put on and taking off regular lower  body clothing?: Total 6 Click Score: 8    End of Session Equipment Utilized During Treatment: Oxygen  OT Visit Diagnosis: Other abnormalities of gait and mobility (R26.89);Muscle weakness (generalized) (M62.81)   Activity Tolerance Patient tolerated treatment well   Patient Left in bed;with call bell/phone within reach;with family/visitor present   Nurse Communication          Time: 9794-8016 OT Time Calculation (min): 24 min  Charges: OT General Charges $OT Visit: 1 Visit OT Treatments $Self Care/Home Management : 8-22 mins $Therapeutic Activity: 8-22 mins  04/12/2017 Nestor Lewandowsky, OTR/L Pager: [phone]  Malka So 04/12/2017, 11:26 AM

## 2017-04-13 LAB — CBC
HCT: 30.7 % — ABNORMAL LOW (ref 36.0–46.0)
HEMOGLOBIN: 9.7 g/dL — AB (ref 12.0–15.0)
MCH: 28.9 pg (ref 26.0–34.0)
MCHC: 31.6 g/dL (ref 30.0–36.0)
MCV: 91.4 fL (ref 78.0–100.0)
Platelets: 56 10*3/uL — ABNORMAL LOW (ref 150–400)
RBC: 3.36 MIL/uL — ABNORMAL LOW (ref 3.87–5.11)
RDW: 16.5 % — ABNORMAL HIGH (ref 11.5–15.5)
WBC: 4.2 10*3/uL (ref 4.0–10.5)

## 2017-04-13 LAB — GLUCOSE, CAPILLARY
GLUCOSE-CAPILLARY: 155 mg/dL — AB (ref 65–99)
GLUCOSE-CAPILLARY: 138 mg/dL — AB (ref 65–99)
Glucose-Capillary: 152 mg/dL — ABNORMAL HIGH (ref 65–99)
Glucose-Capillary: 122 mg/dL — ABNORMAL HIGH (ref 65–99)

## 2017-04-13 LAB — COMPREHENSIVE METABOLIC PANEL
ALBUMIN: 2.2 g/dL — AB (ref 3.5–5.0)
ALK PHOS: 98 U/L (ref 38–126)
ALT: 13 U/L — ABNORMAL LOW (ref 14–54)
ANION GAP: 9 (ref 5–15)
AST: 33 U/L (ref 15–41)
BUN: 36 mg/dL — ABNORMAL HIGH (ref 6–20)
CALCIUM: 8.1 mg/dL — AB (ref 8.9–10.3)
CO2: 25 mmol/L (ref 22–32)
Chloride: 98 mmol/L — ABNORMAL LOW (ref 101–111)
Creatinine, Ser: 3.15 mg/dL — ABNORMAL HIGH (ref 0.44–1.00)
GFR calc Af Amer: 16 mL/min — ABNORMAL LOW (ref >60)
GFR calc non Af Amer: 14 mL/min — ABNORMAL LOW (ref >60)
GLUCOSE: 161 mg/dL — AB (ref 65–99)
POTASSIUM: 4.1 mmol/L (ref 3.5–5.1)
SODIUM: 132 mmol/L — AB (ref 135–145)
Total Bilirubin: 0.8 mg/dL (ref 0.3–1.2)
Total Protein: 4.6 g/dL — ABNORMAL LOW (ref 6.5–8.1)

## 2017-04-13 LAB — PROTIME-INR
INR: 1.33
PROTHROMBIN TIME: 16.4 s — AB (ref 11.4–15.2)

## 2017-04-13 LAB — HEPARIN LEVEL (UNFRACTIONATED): Heparin Unfractionated: 0.5 IU/mL (ref 0.30–0.70)

## 2017-04-13 LAB — SAVE SMEAR

## 2017-04-13 MED ORDER — WARFARIN SODIUM 5 MG PO TABS: 5.0000 mg | ORAL_TABLET | Freq: Once | ORAL | Status: DC

## 2017-04-13 MED ORDER — ALTEPLASE 2 MG IJ SOLR
2.0000 mg | Freq: Once | INTRAMUSCULAR | Status: DC | PRN
Start: 2017-04-13 — End: 2017-04-13

## 2017-04-13 MED ORDER — WARFARIN SODIUM 5 MG PO TABS
5.0000 mg | ORAL_TABLET | Freq: Once | ORAL | Status: AC
  Administered 2017-04-13: 5 mg via ORAL
  Filled 2017-04-13: qty 1

## 2017-04-13 MED ORDER — LIDOCAINE HCL (PF) 1 % IJ SOLN: 5.0000 mL | INTRAMUSCULAR | Status: DC | PRN

## 2017-04-13 MED ORDER — MIDODRINE HCL 5 MG PO TABS
ORAL_TABLET | ORAL | Status: AC
  Filled 2017-04-13: qty 2

## 2017-04-13 MED ORDER — LORAZEPAM 0.5 MG PO TABS
ORAL_TABLET | ORAL | Status: AC
  Filled 2017-04-13: qty 1

## 2017-04-13 MED ORDER — DARBEPOETIN ALFA 60 MCG/0.3ML IJ SOSY
PREFILLED_SYRINGE | INTRAMUSCULAR | Status: AC
  Filled 2017-04-13: qty 0.3

## 2017-04-13 MED ORDER — HEPARIN SODIUM (PORCINE) 1000 UNIT/ML DIALYSIS: 1000.0000 [IU] | INTRAMUSCULAR | Status: DC | PRN

## 2017-04-13 MED ORDER — LIDOCAINE-PRILOCAINE 2.5-2.5 % EX CREA: 1.0000 "application " | TOPICAL_CREAM | CUTANEOUS | Status: DC | PRN

## 2017-04-13 MED ORDER — SODIUM CHLORIDE 0.9 % IV SOLN: 100.0000 mL | INTRAVENOUS | Status: DC | PRN

## 2017-04-13 MED ORDER — HEPARIN SODIUM (PORCINE) 1000 UNIT/ML DIALYSIS: 20.0000 [IU]/kg | INTRAMUSCULAR | Status: DC | PRN

## 2017-04-13 MED ORDER — PENTAFLUOROPROP-TETRAFLUOROETH EX AERO: 1.0000 "application " | INHALATION_SPRAY | CUTANEOUS | Status: DC | PRN

## 2017-04-13 NOTE — Consult Note (Signed)
Consultation Note Date: 04/13/2017   Patient Name: Destiny [name]  DOB: 31-Jul-1940  MRN: [id number]  Age / Sex: 76 y.o., female  PCP: Mosie Lukes, MD Referring Physician: Kayleen Memos, DO  Reason for Consultation: Establishing goals of care  HPI/Patient Profile: 76 y.o. female     admitted on 03/29/2017   Clinical Assessment and Goals of Care:  76 yo lady, lives at home with husband, has several underlying conditions of NHL, CHF, has ICD, PAF, OSA/OSA, stage V CKD, was admitted with worsening ESRD, volume overload, was having progressive dyspnea at home, patient has been started on HD, she is also being considered for placement of R arm fistula soon, she is to go to [facility] facility on D/C.   A palliative consult has been placed for additional discussions.   The patient has just returned from dialysis, she appears, weak, chronically ill, deconditioned, she is not confused on my exam, recognizes husband present in the room, is able to answer appropriately. She denies pain, does appear to have some baseline dyspnea.   I introduced myself and palliative care as follows: Palliative medicine is specialized medical care for people living with serious illness. It focuses on providing relief from the symptoms and stress of a serious illness. The goal is to improve quality of life for both the patient and the family.  Patient's husband is feeding her lunch, patient tolerating POs. Husband expresses his thoughts, wants the fistula to be placed soon if possible. He knows she is going to [facility] facility towards the end of this hospitalization.   Goals, wishes and values important to the patient and her husband attempted to be explored. The patient elects continuation of current therapies, we gently discussed about burden of multiple illnesses. For now, patient wishes to continue with full code, full scope  treatment. Husband is asking about how she will be transported to dialysis from her SNF. All of their questions answered to the best of my ability, attempted early conversations regarding scope of palliative care and how it can help. See recommendations below, thank you for the consult.   NEXT OF KIN  husband.   SUMMARY OF RECOMMENDATIONS    full code/full scope To go to [facility] on D/C Recommend palliative care to follow at SNF for additional support  Code Status/Advance Care Planning:  Full code    Symptom Management:    as above   Palliative Prophylaxis:   Bowel Regimen  Additional Recommendations (Limitations, Scope, Preferences):  Full Scope Treatment  Psycho-social/Spiritual:   Desire for further Chaplaincy support:no  Additional Recommendations: Caregiving  Support/Resources  Prognosis:   Unable to determine  Discharge Planning: [place] for rehab with Palliative care service follow-up      Primary Diagnoses: Present on Admission: . Morbid obesity ([place]) . Diabetes mellitus type 2 with retinopathy ([place]) . Hyperlipidemia . Essential hypertension . Automatic implantable cardioverter-defibrillator in situ . Hypothyroidism . Anemia . CKD (chronic kidney disease) stage 4, GFR 15-29 ml/min (HCC) . COPD, severe ([place])  I have reviewed the medical record, interviewed the patient and family, and examined the patient. The following aspects are pertinent.  Past Medical History:  Diagnosis Date  . AICD (automatic cardioverter/defibrillator) present   . Anemia, unspecified 12/13/2013  . Back pain, chronic    "just when I walk; mass on 3rd and 4th vertebrae right lower back"  . Biventricular implantable cardiac defibrillator in situ 2007, 2012   a. 2007;  b. 2012 Gen change: SJM 3231-40 Uni BiV ICD, ser # [id number].  Marland Kitchen CAD (coronary artery disease)    a. 05/2001 CABG x4: LIMA->LAD, VG->D1, VG->D2, VG->RCA.  . Cardiomyopathy, ischemic    a.  2012 s/p SJM 3231-40 Uni BiV ICD, ser # [id number].  . Carotid stenosis    a. 10/19/2011 carotid duplex - Mild hard plaque bilaterally. Stable 40-59% bilateral ICA stenosis. Carotid US (04/2013):  Bilateral 40-59% ICA.  F/u 1 year  . Cerumen impaction 10/28/2015  . Chronic combined systolic and diastolic CHF (congestive heart failure) (HCC)    a. EF as low as 25% in 2006;  b. EF 60-65% in 12/2011;  c. 02/2013 Echo: EF 35-40, mod mid-dist antsept HK, Gr 2 DD, mild LVH.  . CKD (chronic kidney disease), stage IV ([place])    hx/notes 09/04/2016  . COPD (chronic obstructive pulmonary disease) ([place])   . COPD, severe ([place]) 01/22/2016  . 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 Dyslipidemia   . Gout ~ 08/2011  . History of vertebral fracture 09/18/2016  . Hypercalcemia 09/26/2013  . Hypertension   . Hypothyroidism   . Hypoxemia requiring supplemental oxygen   . LBBB (left bundle branch block)   . Lumbar compression fracture ([place])    L2/notes 09/04/2016  . Morbid obesity with BMI of 45.0-49.9, adult (HCC)    Ht. 5'4". BMI 47.2  . Mural thrombus of left ventricle    before 2003, while on Coumadin, No h/o CVA  . 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 Benay Spice, in remission  . On home oxygen therapy    "normally 2-3L; 24/7" (12/14/2015); "2L; 24/7" (09/04/2016)  . OSA on CPAP   . PAF (paroxysmal atrial fibrillation) (HCC)    on Coumadin  . Presence of permanent cardiac pacemaker   . Retinopathy due to secondary diabetes ([place])    type II, uncontrolled  . Urinary incontinence 06/25/2016  . Vertebral fracture, osteoporotic ([place]) 08/27/2016  . Vitamin D deficiency 06/09/2012   historical   Social History   Socioeconomic History  . Marital status: Married    Spouse name: None  . Number of children: Y  . Years of education: None  . Highest education level: None  Social Needs  . Financial resource  strain: None  . Food insecurity - worry: None  . Food insecurity - inability: None  . Transportation needs - medical: None  . Transportation needs - non-medical: None  Occupational History  . 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   Tobacco Use  . Smoking status: Never Smoker  . Smokeless tobacco: Never Used  Substance and Sexual Activity  . Alcohol use: No  . Drug use: No  . Sexual activity: Not Currently    Birth control/protection: None    Comment: lives with husband, no major dietary restrictions  Other Topics Concern  . None  Social History Narrative   [place].  She does not routinely exercise or adhere to any particular diet.     Family History  Problem Relation Age of Onset  . Kidney cancer Mother        kidney and female repo - died @ 22  . Asthma Mother   . Cancer Mother        gyn and renal  . Heart disease Mother        CAD  . Heart disease Father        died @ 53  . Stroke Father   . Diabetes Father   . Hypertension Father   . Hyperlipidemia Father   . Heart attack Father   . Hypertension Son   . Kidney cancer Maternal Uncle   . Cancer Maternal Uncle   . Cirrhosis Maternal Grandmother        non alcohol  . Cancer Maternal Grandfather   . Kidney cancer Maternal Grandfather   . Hypertension Maternal Aunt    Scheduled Meds: . amiodarone  200 mg Oral Daily  . atorvastatin  10 mg Oral q1800  . carvedilol  6.25 mg Oral BID WC  . darbepoetin (ARANESP) injection - DIALYSIS  60 mcg Intravenous Q Sat-HD  . escitalopram  10 mg Oral Daily  . feeding supplement (NEPRO CARB STEADY)  237 mL Oral TID BM  . gabapentin  300 mg Oral QHS  . Gerhardt's butt cream   Topical TID  . insulin aspart  0-9 Units Subcutaneous TID WC  . insulin NPH Human  10 Units Subcutaneous QAC breakfast  . ipratropium-albuterol  3 mL Nebulization Q6H  . levothyroxine  88 mcg Oral QAC breakfast  . loperamide  2 mg Oral  Daily  . sodium chloride flush  3 mL Intravenous Q12H  . Warfarin - Pharmacist Dosing Inpatient   Does not apply q1800   Continuous Infusions: . sodium chloride    . heparin 1,200 Units/hr (04/13/17 0042)   PRN Meds:.sodium chloride, acetaminophen, dicyclomine, diphenoxylate-atropine, fluticasone, ipratropium-albuterol, LORazepam, LORazepam, midodrine, ondansetron (ZOFRAN) IV, sodium chloride, sodium chloride flush Medications Prior to Admission:  Prior to Admission medications   Medication Sig Start Date End Date Taking? Authorizing Provider  acetaminophen (TYLENOL) 500 MG tablet Take 1,000 mg every 6 (six) hours as needed by mouth for mild pain or headache.    Yes [provider]  albuterol (PROVENTIL HFA;VENTOLIN HFA) 108 (90 BASE) MCG/ACT inhaler Inhale 2 puffs into the lungs every 6 (six) hours as needed for wheezing or shortness of breath. 12/26/12  Yes Mosie Lukes, MD  amiodarone (PACERONE) 200 MG tablet Take 200 mg by mouth daily.   Yes [provider]  atorvastatin (LIPITOR) 10 MG tablet Take 1 tablet (10 mg total) by mouth daily. 02/15/17  Yes Bensimhon, Shaune Pascal, MD  carvedilol (COREG) 12.5 MG tablet Take 1 tablet (12.5 mg total) by mouth 2 (two) times daily. 02/15/17  Yes Bensimhon, Shaune Pascal, MD  dicyclomine (BENTYL) 20 MG tablet Take 1 tablet (20 mg total) by mouth 2 (two) times daily as needed for spasms. 09/28/16  Yes Julianne Rice, MD  diphenoxylate-atropine (LOMOTIL) 2.5-0.025 MG tablet Take 1 tablet 4 (four) times daily as needed by mouth for diarrhea or loose stools. 03/22/17  Yes Mosie Lukes, MD  escitalopram (LEXAPRO) 10 MG tablet TAKE 1 TABLET EVERY DAY Patient taking differently: Take 10 mg by mouth daily 11/15/16  Yes Mosie Lukes, MD  fluticasone (FLONASE) 50 MCG/ACT nasal spray Place 2  sprays into both nostrils daily as needed for allergies or rhinitis. As needed for nasal stuffiness. Patient taking differently: Place 2 sprays daily as needed  into both nostrils for allergies or rhinitis.  01/13/14  Yes Mosie Lukes, MD  gabapentin (NEURONTIN) 300 MG capsule Take 1 capsule (300 mg total) by mouth at bedtime. 01/29/17  Yes Mosie Lukes, MD  glucose blood (FREESTYLE LITE) test strip DX: 250.60  Check sugars bid and as needed 05/22/13  Yes Mosie Lukes, MD  insulin NPH (HUMULIN N,NOVOLIN N) 100 UNIT/ML injection Inject 15 Units daily before breakfast into the skin.    Yes [provider]  levothyroxine (SYNTHROID, LEVOTHROID) 88 MCG tablet Take 1 tablet (88 mcg total) by mouth daily. 02/01/17  Yes Mosie Lukes, MD  OXYGEN Inhale 3 L continuous into the lungs.    Yes [provider]  torsemide (DEMADEX) 20 MG tablet Take 20 mg See admin instructions by mouth. Friday, Saturday, Sunday take 40 mg Monday- Thursday ( 20 mg )   Yes [provider]  warfarin (COUMADIN) 5 MG tablet TAKE AS DIRECTED BY ANTICOAGULATION CLINIC. Patient taking differently: Take 5 mg by mouth daily on Monday. Take 2.5 mg by mouth daily on all other days 10/15/16  Yes Evans Lance, MD  metolazone (ZAROXOLYN) 2.5 MG tablet Take 1 tablet (2.5 mg total) by mouth as needed (for weight greater than 235 lb). Patient not taking: Reported on 03/28/2017 02/26/17   Bensimhon, Shaune Pascal, MD   Allergies  Allergen Reactions  . Sulfonamide Derivatives Other (See Comments)    UNSPECIFIED REACTION OF CHILDHOOD [per Mother]   Review of Systems Denies pain.   Physical Exam Elderly appearing lady Appears weak and deconditioned Awake alert after returning from HD Regular work of breathing S1 S2 Edema Non focal  Vital Signs: BP (!) 130/47 (BP Location: Left Leg)   Pulse 71   Temp 98.8 F (37.1 C)   Resp (!) 21   Ht 5\' 4"  (1.626 m)   Wt 97 kg (213 lb 13.5 oz)   SpO2 98%   BMI 36.71 kg/m  Pain Assessment: No/denies pain   Pain Score: 0-No pain   SpO2: SpO2: 98 % O2 Device:SpO2: 98 % O2 Flow Rate: .O2 Flow Rate (L/min): 4  L/min  IO: Intake/output summary:   Intake/Output Summary (Last 24 hours) at 04/13/2017 1322 Last data filed at 04/13/2017 1105 Gross per 24 hour  Intake 10.5 ml  Output 2500 ml  Net -2489.5 ml    LBM: Last BM Date: 04/11/17 Baseline Weight: Weight: 106.1 kg (234 lb) Most recent weight: Weight: 97 kg (213 lb 13.5 oz)     Palliative Assessment/Data:   PPS 40%  Time In:  12 Time Out:  1300 Time Total:  60  Greater than 50%  of this time was spent counseling and coordinating care related to the above assessment and plan.  Signed by: Loistine Chance, MD  [phone]  Please contact Palliative Medicine Team phone at [phone] for questions and concerns.  For individual provider: See Shea Evans

## 2017-04-13 NOTE — Progress Notes (Signed)
ANTICOAGULATION CONSULT NOTE - Follow Up Consult  Pharmacy Consult for Coumadin and Heparin bridge Indication: atrial fibrillation  Allergies  Allergen Reactions  . Sulfonamide Derivatives Other (See Comments)    UNSPECIFIED REACTION OF CHILDHOOD [per Mother]    Patient Measurements: Height: 5\' 4"  (162.6 cm) Weight: 213 lb 13.5 oz (97 kg) IBW/kg (Calculated) : 54.7 Heparin Dosing Weight: 80 kg  Vital Signs: Temp: 98.8 F (37.1 C) (12/01 1144) Temp Source: Oral (12/01 1105) BP: 130/47 (12/01 1144) Pulse Rate: 71 (12/01 1144)  Labs: Recent Labs    04/11/17 0310 04/12/17 0736 04/12/17 0904 04/12/17 2256 04/13/17 0326 04/13/17 1144  HGB 10.0*  --  9.9*  --  9.7*  --   HCT 31.1*  --  31.6*  --  30.7*  --   PLT 58*  --  49*  --  56*  --   LABPROT  --  14.3  --   --  16.4*  --   INR  --  1.12  --   --  1.33  --   HEPARINUNFRC  --   --   --  0.17*  --  0.50  CREATININE 2.04* 2.70*  --   --  3.15*  --     Estimated Creatinine Clearance: 17.7 mL/min (A) (by C-G formula based on SCr of 3.15 mg/dL (H)).  Assessment:   76 yr old female on Coumadin for atrial fibrillation and history of mural thrombus.   INR therapeutic (2.71) on admit 11/16; INR up to 3.53 on 11/19 and Coumadin held. Vitamin K 5 mg PO given on 11/23 when INR down to 2.68.  IJ line placed 11/23 for dialysis access.  Coumadin had continued on hold, anticipating AVF placement 11/30, but procedure has been postponed, and Coumadin was resumed last night with 5 mg. Has been on sq heparin since 11/28, but now to change to IV heparin.  Hx thrombocytopenia. Platelet count 94 on admit, trended down to 49 on 11/30. No bleeding noted.  Discussed with Dr. [place] Crane; plan IV heparin as long as platelet count remains >20, or if any s/sx bleeding.    Heparin begun conservatively on 11/30. Initial heparin level 0.17 (low) on 1000 units/hr and infusion rate increased to 1200 units/hr. Heparin level is now therapeutic (0.50). No further  drop in platelet count.    INR 1.33 after Coumadin 5 mg x 2 days.    Noted plan for AV fistula early next week if still hospitalized or as outpatient. Will need to stop Coumadin.  Discussed with Dr. [place] Crane.  Procedure not anticipated to be this coming week, to continue Coumadin.    Coumadin regimen PTA:  2.5 mg daily except 5 mg on Mondays.  Goal of Therapy:  INR 2-3 Heparin level 0.3-0.5 units/ml (lower end of therapeutic range) Monitor platelets by anticoagulation protocol: Yes   Plan:   Continue heparin drip at 1200 units/hr.  Target low therapeutic heparin levels.  Coumadin 5 mg again today.  Daily heparin level, PT/INR and CBC.  Monitor for an s/sx bleeding.  Arty Baumgartner, [place] Pager: [phone] 04/13/2017,1:25 PM

## 2017-04-13 NOTE — Plan of Care (Signed)
Pt's husband states that patients appetite is improving and pt has been eating more of her meals

## 2017-04-13 NOTE — Progress Notes (Signed)
PROGRESS NOTE    Destiny [name]  NTZ:[id number] DOB: 04-16-1941 DOA: 03/29/2017 PCP: Mosie Lukes, MD    Brief Narrative:  76 yo F CHF EF 20% with AICD, COPD on home oxygen of 2 L, CKD stage IV, anticoagulation with Coumadin with prior LV mural thrombus, and DM who presents with few days dyspnea.   Treated with 4 days ceftriaxone and azithromycin, no improvement.  Attempted diuresis but very little urine output, so started dialysis and tolerating well.  Pt seen and examined with her husband at her bedside. She denies any chest pain, palpitations or dyspnea while laying still in the bed. The writer spoke with the vascular team. Plan is to possibly obtain fistula in the outpatient setting. Not a good candidate at this time due to very poor overall health. Patient back on coumadin and being bridged with pharmacy management. Patient is very deconditioned and get dyspneic even with minimal exertion as with sitting up in bed. Palliative care consulted.  Patient was seen and examined in her bed at dialysis unit. Denies any pain, or dyspnea. Somnolent. INR subtherapeutic on coumadin.   Assessment & Plan:  Principal Problem:   Acute on chronic combined systolic and diastolic CHF (congestive heart failure) (HCC) Active Problems:   Diabetes mellitus type 2 with retinopathy (HCC)   Hyperlipidemia   Essential hypertension   Automatic implantable cardioverter-defibrillator in situ   Hypothyroidism   Anemia   Morbid obesity (HCC)   COPD, severe (HCC)   CKD (chronic kidney disease) stage 4, GFR 15-29 ml/min ([place])   Community acquired pneumonia   Acute hypoxemic respiratory failure (HCC)   Pressure injury of skin   Acute on chronic hypoxic respiratory failure most likely 2/2 to chronic systolic congestive heart failure EF 20%, pleural effusion, fluid overload -Failed diuretics, elected to begin HD in consultation with Nephrology.  -respiratory status improved after dialysis -Temp cath in [facility]  placed.   -HD per Nephrology -Not a good candidate for fistula placement at this time per vascular team. -right thoracentesis with 900 cc body fluid removed 04/02/17. Pleural fluid with elevated LDH, low protein.  Meets Light's criteria, but suspect this is not parapneumonic. -continue O2 supplementation to maintain O2 saturation 92% or greater -Bipap at night  ESRD now on HD -HD per Nephrology -MBD w/u, CLIP, Aranesp per Nephrology -Not a good candidate for fistula placement per vascular surgery team -Possible fistula placement in the outpatient setting  Atrial fibrillation CHADS2Vasc 4 at least, but never stroke, LV thrombus years ago. -warfarin, amiodarone. -resume warfarin, to bridge with heparin drip -Pharmacy management of heparin drip appreciated -Continue amiodarone, Coreg -INR 1.33 from 1.12 -cbc, INR am  Subtherapeutic INR -improving INR 1.33 from INR 1.12 (04/12/17) -coumadin bridged with heparin drip -pharmacy management of coumadin and heparin drip -INR am  Chronic thrombocytopenia -improving  -plt 56 from 49 from 58k -no sign of gross bleeding -if continues to trend down consult hematology -peripheral smear ordered -In setting of NHL in remission, followed by Dr. Benay Spice.   -No cirrhosis on last CT imaging.  -cbc am  Abdominal distention 2/2 to moderate ascites, suspected hepatic steatosis vs chronic liver disease -abdominal ultrasound 04/12/17: 1. Moderate intra-abdominal ascites. 2. Increased hepatic echogenicity may reflect hepatic steatosis or other chronic liver disease. 3. Splenomegaly. 4. Bilateral renal atrophy with parenchymal thinning consistent with chronic medical renal disease.  -hepatitis panel, hep B HIV negative (03/2017) -cmp am   Normocytic anemia -most likely 2/2 to anemia of chronic disease with  ESRD -hg stable at 9. Baseline 9-10 -no sign of overt bleeding -cbc am  Acute on chronic systolic CHF -As above -Continue aspirin,  statin, BB, HD  Hyponatremia -Improving -Na+ 131 from 133 -fluid restriction -renal diet with 1200 cc fluid restriction -BMP am  Deconditioning/ambulatory dysfunction -PT/OT evaluate and treat -OOB to chair with every shift -fall precaution  Suspected Community Acquired pneumonia, present on admission Completed course of antibiotics.    Chronic COPD -Continue bronchodilators PRN  Diabetes -Continu NPH -Continue SSI -bmp  Hypothyroidism -Continue levothyroxine  Other medications -Continue Bentyl -Continue Lexapro -Continue gabapentin, dose decreased now on HD  Elevated troponin Heart stretch in ESRD, doubt ACS Peaked at 0.04 (04/05/17)  DVT prophylaxis: heparin drip bridging coumadin Code Status: FULL Family Communication: No family member at bedside Disposition Plan: will stay another midnight to improve INR level.   Consultants:   Nephrology, EP, IR  Procedures:   Thoracentesis  HD  Antimicrobials:   Ceftraixone and azithromycin, completed 4 days    Objective: Vitals:   04/13/17 1030 04/13/17 1045 04/13/17 1105 04/13/17 1144  BP: (!) 136/59 (!) 131/54 (!) 134/55 (!) 130/47  Pulse: 73 70 70 71  Resp:   19 (!) 21  Temp:   97.7 F (36.5 C) 98.8 F (37.1 C)  TempSrc:   Oral   SpO2:   98% 98%  Weight:   97 kg (213 lb 13.5 oz)   Height:        Intake/Output Summary (Last 24 hours) at 04/13/2017 1325 Last data filed at 04/13/2017 1105 Gross per 24 hour  Intake 10.5 ml  Output 2500 ml  Net -2489.5 ml   Filed Weights   04/13/17 0634 04/13/17 0725 04/13/17 1105  Weight: 101.1 kg (222 lb 14.2 oz) 99.6 kg (219 lb 9.3 oz) 97 kg (213 lb 13.5 oz)    Examination: General appearance: Elderly obese adult female, interactive, oriented just appears extremely tired.   HEENT: Anicteric, conjunctiva pink, lids and lashes normal. No more periorbital puffiness.  No nasal deformity, discharge, epistaxis.  Lips moist.   Skin: Warm and dry.  No jaundice.  No  suspicious rashes or lesions.  Periorbital edema improved. Cardiac: RRR, nl S1-S2, SEM noted.  Capillary refill is brisk.  JVP not visible.  Trace LE edema.  Radia pulses 2+ and symmetric. Respiratory: labored breathing, appears tired.  Diminshed thoughout, no wheezes. Abdomen: Abdomen soft.  No TTP. No ascites, distension, hepatosplenomegaly.   MSK: No deformities or effusions. Neuro: Oriented, globally very weak.  Facies symmetric, moves upper exttremities equally. Psych: Affect flat.    Data Reviewed: I have personally reviewed following labs and imaging studies:  CBC: Recent Labs  Lab 04/09/17 0500 04/10/17 0456 04/11/17 0310 04/12/17 0904 04/13/17 0326  WBC 7.1 6.6 6.9 4.2 4.2  HGB 10.3* 10.1* 10.0* 9.9* 9.7*  HCT 32.9* 31.9* 31.1* 31.6* 30.7*  MCV 92.2 91.1 90.7 91.1 91.4  PLT 63* 63* 58* 49* 56*   Basic Metabolic Panel: Recent Labs  Lab 04/07/17 0332 04/09/17 0004 04/09/17 0549 04/10/17 0456 04/11/17 0310 04/12/17 0736 04/13/17 0326  NA 133* 134* 133* 133* 133* 131* 132*  K 3.9 4.2 4.3 4.1 3.6 4.8 4.1  CL 100* 99* 99* 99* 99* 100* 98*  CO2 24 28 27 25 26  21* 25  GLUCOSE 87 110* 131* 110* 94 112* 161*  BUN 44* 33* 44* 48* 26* 34* 36*  CREATININE 2.24* 2.04* 2.70* 2.76* 2.04* 2.70* 3.15*  CALCIUM 7.7* 7.8* 8.2* 8.2* 7.9* 8.1*  8.1*  PHOS 3.1 2.6 3.8 3.7  --   --   --    GFR: Estimated Creatinine Clearance: 17.7 mL/min (A) (by C-G formula based on SCr of 3.15 mg/dL (H)). Liver Function Tests: Recent Labs  Lab 04/07/17 0332 04/09/17 0004 04/09/17 0549 04/10/17 0456 04/13/17 0326  AST  --   --   --   --  33  ALT  --   --   --   --  13*  ALKPHOS  --   --   --   --  98  BILITOT  --   --   --   --  0.8  PROT  --   --   --   --  4.6*  ALBUMIN 2.5* 2.8* 2.5* 2.4* 2.2*   No results for input(s): LIPASE, AMYLASE in the last 168 hours. No results for input(s): AMMONIA in the last 168 hours. Coagulation Profile: Recent Labs  Lab 04/07/17 0332 04/12/17 0736  04/13/17 0326  INR 1.44 1.12 1.33   Cardiac Enzymes: No results for input(s): CKTOTAL, CKMB, CKMBINDEX, TROPONINI in the last 168 hours. BNP (last 3 results) No results for input(s): PROBNP in the last 8760 hours. HbA1C: No results for input(s): HGBA1C in the last 72 hours. CBG: Recent Labs  Lab 04/12/17 0747 04/12/17 1142 04/12/17 1601 04/12/17 2107 04/13/17 1151  GLUCAP 86 105* 193* 152* 122*   Lipid Profile: No results for input(s): CHOL, HDL, LDLCALC, TRIG, CHOLHDL, LDLDIRECT in the last 72 hours. Thyroid Function Tests: No results for input(s): TSH, T4TOTAL, FREET4, T3FREE, THYROIDAB in the last 72 hours. Anemia Panel: No results for input(s): VITAMINB12, FOLATE, FERRITIN, TIBC, IRON, RETICCTPCT in the last 72 hours. Urine analysis:    Component Value Date/Time   COLORURINE YELLOW 03/29/2017 2348   APPEARANCEUR HAZY (A) 03/29/2017 2348   LABSPEC 1.013 03/29/2017 2348   PHURINE 5.0 03/29/2017 2348   GLUCOSEU NEGATIVE 03/29/2017 2348   GLUCOSEU NEGATIVE 06/25/2016 1002   HGBUR NEGATIVE 03/29/2017 2348   HGBUR large 05/17/2008 1325   BILIRUBINUR NEGATIVE 03/29/2017 [place] 03/29/2017 2348   PROTEINUR NEGATIVE 03/29/2017 2348   UROBILINOGEN 0.2 06/25/2016 1002   NITRITE NEGATIVE 03/29/2017 2348   LEUKOCYTESUR LARGE (A) 03/29/2017 2348   Sepsis Labs: @LABRCNTIP (procalcitonin:4,lacticidven:4)  ) No results found for this or any previous visit (from the past 240 hour(s)).       Radiology Studies: US Abdomen Complete  Result Date: 04/12/2017 CLINICAL DATA:  Abdominal bloating. EXAM: ABDOMEN ULTRASOUND COMPLETE COMPARISON:  Abdominal CT 09/28/2016 FINDINGS: Gallbladder: Not visualized, fluid in the gallbladder fossa. Common bile duct: Diameter: 5 mm. Liver: No focal lesion identified. Increased in parenchymal echogenicity. No definite capsular irregularity. Portal vein is patent on color Doppler imaging with normal direction of blood flow  towards the liver. IVC: No abnormality visualized. Pancreas: Not well evaluated due to bowel gas. Spleen: Enlarged measuring 15.9 x 14.9 x 8.5 cm. Echogenic foci consistent with calcifications as seen on prior CT. Right Kidney: Length: 10.4 cm. Thinning of renal parenchyma with increased renal echogenicity. Complex cystic lesion in the mid kidney measures 1.8 cm with probable internal septations. Echogenic structures the hila likely vascular. No hydronephrosis. Left Kidney: Length: 9.0 cm. Thinning of renal parenchyma with increased renal echogenicity. No focal lesion. No hydronephrosis. Abdominal aorta: No aneurysm visualized. Technically limited evaluation due to bowel gas. Other findings: Moderate intra-abdominal ascites. IMPRESSION: 1. Moderate intra-abdominal ascites. 2. Increased hepatic echogenicity may reflect hepatic steatosis or other chronic liver  disease. 3. Splenomegaly. 4. Bilateral renal atrophy with parenchymal thinning consistent with chronic medical renal disease. Electronically Signed   By: Jeb Levering M.D.   On: 04/12/2017 22:18        Scheduled Meds: . amiodarone  200 mg Oral Daily  . atorvastatin  10 mg Oral q1800  . carvedilol  6.25 mg Oral BID WC  . darbepoetin (ARANESP) injection - DIALYSIS  60 mcg Intravenous Q Sat-HD  . escitalopram  10 mg Oral Daily  . feeding supplement (NEPRO CARB STEADY)  237 mL Oral TID BM  . gabapentin  300 mg Oral QHS  . Gerhardt's butt cream   Topical TID  . insulin aspart  0-9 Units Subcutaneous TID WC  . insulin NPH Human  10 Units Subcutaneous QAC breakfast  . ipratropium-albuterol  3 mL Nebulization Q6H  . levothyroxine  88 mcg Oral QAC breakfast  . loperamide  2 mg Oral Daily  . sodium chloride flush  3 mL Intravenous Q12H  . warfarin  5 mg Oral ONCE-1800  . Warfarin - Pharmacist Dosing Inpatient   Does not apply q1800   Continuous Infusions: . sodium chloride    . heparin 1,200 Units/hr (04/13/17 0042)     LOS: 15 days     Time spent: 25 minutes    Kayleen Memos, MD [facility] Pager [phone]  If 7PM-7AM, please contact night-coverage [email or web address] Password [facility] 04/13/2017, 11:34 AM

## 2017-04-13 NOTE — Procedures (Signed)
Tolerating HD treatment today.  Lethargic but arouses.  No hemodynamic instability. Destiny [name]

## 2017-04-13 NOTE — Progress Notes (Addendum)
ANTICOAGULATION CONSULT NOTE - Follow Up Consult  Pharmacy Consult for Coumadin and Heparin bridge Indication: atrial fibrillation  Allergies  Allergen Reactions  . Sulfonamide Derivatives Other (See Comments)    UNSPECIFIED REACTION OF CHILDHOOD [per Mother]   Patient Measurements: Height: 5\' 4"  (162.6 cm) Weight: 220 lb 14.4 oz (100.2 kg) IBW/kg (Calculated) : 54.7 Heparin Dosing Weight: 80 kg  Vital Signs: Temp: 98.6 F (37 C) (11/30 2104) Temp Source: Oral (11/30 2104) BP: 127/56 (11/30 2006) Pulse Rate: 73 (11/30 2006)  Labs: Recent Labs    04/10/17 0456 04/11/17 0310 04/12/17 0736 04/12/17 0904 04/12/17 2256  HGB 10.1* 10.0*  --  9.9*  --   HCT 31.9* 31.1*  --  31.6*  --   PLT 63* 58*  --  49*  --   LABPROT  --   --  14.3  --   --   INR  --   --  1.12  --   --   HEPARINUNFRC  --   --   --   --  0.17*  CREATININE 2.76* 2.04* 2.70*  --   --     Estimated Creatinine Clearance: 21 mL/min (A) (by C-G formula based on SCr of 2.7 mg/dL (H)).  Assessment: 76 yr old female on PTA Coumadin for atrial fibrillation and history of mural thrombus. Pharmacy consulted to dose heparin bridge to Coumadin (previously on hold this admission for procedures).  Heparin level is subtherapeutic for low goal at 0.17 and no issues with infusion per RN. No new CBC, however, noted thrombocytopenia which is chronic for patient. MD aware per notes. RN reports no concerns with bleeding at this time.   Aiming for low goal given thrombocytopenia.   Goal of Therapy:  INR 2-3 Heparin level 0.3-0.5 units/ml  Monitor platelets by anticoagulation protocol: Yes   Plan:  Increase heparin gtt to 1200 units/hr (no bolus) Heparin level in 8 hrs  Daily heparin level, INR, and CBC Monitor closely for s/s bleeding and further downtrend in plts  Lavonda Jumbo, PharmD Clinical Pharmacist 04/13/17 12:17 AM

## 2017-04-14 LAB — CBC
HEMATOCRIT: 30.4 % — AB (ref 36.0–46.0)
Hemoglobin: 9.7 g/dL — ABNORMAL LOW (ref 12.0–15.0)
MCH: 29.5 pg (ref 26.0–34.0)
MCHC: 31.9 g/dL (ref 30.0–36.0)
MCV: 92.4 fL (ref 78.0–100.0)
Platelets: 44 10*3/uL — ABNORMAL LOW (ref 150–400)
RBC: 3.29 MIL/uL — ABNORMAL LOW (ref 3.87–5.11)
RDW: 17 % — AB (ref 11.5–15.5)
WBC: 5.2 10*3/uL (ref 4.0–10.5)

## 2017-04-14 LAB — GLUCOSE, CAPILLARY
GLUCOSE-CAPILLARY: 147 mg/dL — AB (ref 65–99)
GLUCOSE-CAPILLARY: 146 mg/dL — AB (ref 65–99)
GLUCOSE-CAPILLARY: 176 mg/dL — AB (ref 65–99)
Glucose-Capillary: 166 mg/dL — ABNORMAL HIGH (ref 65–99)

## 2017-04-14 LAB — PROTIME-INR
INR: 1.38
Prothrombin Time: 16.9 seconds — ABNORMAL HIGH (ref 11.4–15.2)

## 2017-04-14 LAB — HEPARIN LEVEL (UNFRACTIONATED)
Heparin Unfractionated: 0.22 IU/mL — ABNORMAL LOW (ref 0.30–0.70)
Heparin Unfractionated: 0.28 IU/mL — ABNORMAL LOW (ref 0.30–0.70)

## 2017-04-14 MED ORDER — RENA-VITE PO TABS
1.0000 | ORAL_TABLET | Freq: Every day | ORAL | Status: DC
  Administered 2017-04-14 – 2017-04-18 (×2): 1 via ORAL
  Filled 2017-04-14 (×4): qty 1

## 2017-04-14 MED ORDER — CEFAZOLIN SODIUM-DEXTROSE 2-4 GM/100ML-% IV SOLN
2.0000 g | Freq: Once | INTRAVENOUS | Status: AC
  Administered 2017-04-14: 2 g via INTRAVENOUS
  Filled 2017-04-14: qty 100

## 2017-04-14 NOTE — Progress Notes (Signed)
PROGRESS NOTE    Destiny [name]  DZH:[id number] DOB: 1940/07/27 DOA: 03/29/2017 PCP: Mosie Lukes, MD    Brief Narrative:  76 yo F CHF EF 20% with AICD, COPD on home oxygen of 2 L, CKD stage IV, anticoagulation with Coumadin with prior LV mural thrombus, and DM who presents with few days dyspnea.   Treated with 4 days ceftriaxone and azithromycin, no improvement.  Attempted diuresis but very little urine output, so started dialysis and tolerating well.  Pt seen and examined with her husband at her bedside. She denies any chest pain, palpitations or dyspnea while laying still in the bed. The writer spoke with the vascular team. Plan is to possibly obtain fistula in the outpatient setting. Not a good candidate at this time due to very poor overall health. Patient back on coumadin and being bridged with pharmacy management. Patient is very deconditioned and get dyspneic even with minimal exertion as with sitting up in bed. Palliative care consulted.  No acute events overnight. Pt seen and examined with her husband at her bedside. She denies chest pain, abd pain or dyspnea while she is still in bed.   Assessment & Plan:  Principal Problem:   Acute on chronic combined systolic and diastolic CHF (congestive heart failure) (HCC) Active Problems:   Diabetes mellitus type 2 with retinopathy (HCC)   Hyperlipidemia   Essential hypertension   Automatic implantable cardioverter-defibrillator in situ   Hypothyroidism   Anemia   Morbid obesity (HCC)   COPD, severe (HCC)   CKD (chronic kidney disease) stage 4, GFR 15-29 ml/min ([place])   Community acquired pneumonia   Acute hypoxemic respiratory failure (HCC)   Pressure injury of skin   Acute on chronic hypoxic respiratory failure most likely 2/2 to chronic systolic congestive heart failure EF 20%, pleural effusion, fluid overload -Failed diuretics, elected to begin HD in consultation with Nephrology.  -respiratory status improved after  dialysis -Temp cath in LIJ placed.   -HD per Nephrology -Not a good candidate for fistula placement at this time per vascular team. -right thoracentesis with 900 cc body fluid removed 04/02/17. Pleural fluid with elevated LDH, low protein.  Meets Light's criteria, but suspect this is not parapneumonic. 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 -warfarin, amiodarone. -resume warfarin, to bridge with heparin drip -Pharmacy management of heparin drip appreciated -Continue amiodarone, Coreg -INR 1.38 from 1.33 from 1.12 -cbc, INR am  Subtherapeutic INR -improving INR 1.38 from 1.33 from INR 1.12 (04/12/17) -coumadin bridged with heparin drip -pharmacy management of coumadin and heparin drip -INR am  Chronic thrombocytopenia -stable -plt 44 from 56 from 49 from 58k -no sign of gross bleeding -if continues to trend down consult hematology -peripheral smear ordered -In setting of NHL in remission, followed by Dr. Benay Spice.   -No cirrhosis on last CT imaging.  -cbc am  Abdominal distention 2/2 to moderate ascites, suspected hepatic steatosis vs chronic liver disease -abdominal ultrasound 04/12/17: 1. Moderate intra-abdominal ascites. 2. Increased hepatic echogenicity may reflect hepatic steatosis or other chronic liver disease. 3. Splenomegaly. 4. Bilateral renal atrophy with parenchymal thinning consistent with chronic medical renal disease.  -hepatitis panel, hep B HIV negative (03/2017) -cmp  am   Normocytic anemia -most likely 2/2 to anemia of chronic disease with ESRD -hg stable at 9.7. Baseline 9-10 -no sign of overt bleeding -cbc  am  Acute on chronic systolic CHF -As above -Continue aspirin, statin, BB, HD  Hyponatremia -Improving -Na+ 132 from 131 from 133 -fluid restriction -renal diet with 1200 cc fluid restriction -BMP am  Deconditioning/ambulatory dysfunction -PT/OT evaluate and treat -OOB to chair with every shift -fall precaution  Suspected Community Acquired pneumonia, present on admission Completed course of antibiotics.    Chronic COPD -Continue bronchodilators PRN  Diabetes -Continu NPH -Continue SSI -bmp  Hypothyroidism -Continue levothyroxine  Other medications -Continue Bentyl -Continue Lexapro -Continue gabapentin, dose decreased now on HD  Elevated troponin Heart stretch in ESRD, doubt ACS Peaked at 0.04 (04/05/17)  DVT prophylaxis: heparin drip bridging coumadin Code Status: FULL Family Communication: No family member at bedside Disposition Plan: will stay another midnight to improve INR level.   Consultants:   Nephrology, EP, IR  Procedures:   Thoracentesis  HD  Antimicrobials:   Ceftraixone and azithromycin, completed 4 days    Objective: Vitals:   04/13/17 2141 04/14/17 0557 04/14/17 0802 04/14/17 0803  BP:  (!) 114/47    Pulse:  72  69  Resp:  20  17  Temp:  98.7 F (37.1 C)    TempSrc:  Axillary    SpO2: 96% 98% 97% 97%  Weight:  96.4 kg (212 lb 8.4 oz)    Height:        Intake/Output Summary (Last 24 hours) at 04/14/2017 0830 Last data filed at 04/14/2017 0700 Gross per 24 hour  Intake 738.32 ml  Output 2500 ml  Net -1761.68 ml   Filed Weights   04/13/17 0725 04/13/17 1105 04/14/17 0557  Weight: 99.6 kg (219 lb 9.3 oz) 97 kg (213 lb 13.5 oz) 96.4 kg (212 lb 8.4 oz)    Examination: General appearance: Elderly obese adult female, interactive, oriented just appears extremely tired.   HEENT: Anicteric, conjunctiva pink, lids and lashes normal. No more periorbital puffiness.  No nasal deformity, discharge, epistaxis.  Lips moist.    Skin: Warm and dry.  No jaundice.  No suspicious rashes or lesions.  Periorbital edema improved. Cardiac: RRR, nl S1-S2, SEM noted.  Capillary refill is brisk.  JVP not visible.  Trace LE edema.  Radial pulses 2+ and symmetric. Respiratory: labored breathing, appears tired.  Diminshed thoughout, no wheezes. Abdomen: Abdomen soft.  No TTP. No ascites, distension, hepatosplenomegaly.   MSK: No lower extremity edema. 2/4 pulses at dorsalis pedis bilaterally. Neuro: Oriented, globally very weak.  Facies symmetric, moves upper exttremities equally. Psych: Affect flat.   Data Reviewed: I have personally reviewed following labs and imaging studies:  CBC: Recent Labs  Lab 04/10/17 0456 04/11/17 0310 04/12/17 0904 04/13/17 0326 04/14/17 0313  WBC 6.6 6.9 4.2 4.2 5.2  HGB 10.1* 10.0* 9.9* 9.7* 9.7*  HCT 31.9* 31.1* 31.6* 30.7* 30.4*  MCV 91.1 90.7 91.1 91.4 92.4  PLT 63* 58* 49* 56* 44*   Basic Metabolic Panel: Recent Labs  Lab 04/09/17 0004 04/09/17 0549 04/10/17 0456 04/11/17 0310 04/12/17 0736 04/13/17 0326  NA 134* 133* 133* 133* 131* 132*  K 4.2 4.3 4.1 3.6 4.8 4.1  CL 99* 99* 99* 99* 100* 98*  CO2 28 27 25 26  21* 25  GLUCOSE 110* 131* 110* 94 112* 161*  BUN 33* 44* 48* 26* 34* 36*  CREATININE 2.04* 2.70* 2.76* 2.04* 2.70* 3.15*  CALCIUM 7.8*  8.2* 8.2* 7.9* 8.1* 8.1*  PHOS 2.6 3.8 3.7  --   --   --    GFR: Estimated Creatinine Clearance: 17.7 mL/min (A) (by C-G formula based on SCr of 3.15 mg/dL (H)). Liver Function Tests: Recent Labs  Lab 04/09/17 0004 04/09/17 0549 04/10/17 0456 04/13/17 0326  AST  --   --   --  33  ALT  --   --   --  13*  ALKPHOS  --   --   --  98  BILITOT  --   --   --  0.8  PROT  --   --   --  4.6*  ALBUMIN 2.8* 2.5* 2.4* 2.2*   No results for input(s): LIPASE, AMYLASE in the last 168 hours. No results for input(s): AMMONIA in the last 168 hours. Coagulation Profile: Recent Labs  Lab 04/12/17 0736 04/13/17 0326 04/14/17 0313  INR  1.12 1.33 1.38   Cardiac Enzymes: No results for input(s): CKTOTAL, CKMB, CKMBINDEX, TROPONINI in the last 168 hours. BNP (last 3 results) No results for input(s): PROBNP in the last 8760 hours. HbA1C: No results for input(s): HGBA1C in the last 72 hours. CBG: Recent Labs  Lab 04/12/17 2107 04/13/17 1151 04/13/17 1627 04/13/17 2108 04/14/17 0742  GLUCAP 152* 122* 155* 138* 147*   Lipid Profile: No results for input(s): CHOL, HDL, LDLCALC, TRIG, CHOLHDL, LDLDIRECT in the last 72 hours. Thyroid Function Tests: No results for input(s): TSH, T4TOTAL, FREET4, T3FREE, THYROIDAB in the last 72 hours. Anemia Panel: No results for input(s): VITAMINB12, FOLATE, FERRITIN, TIBC, IRON, RETICCTPCT in the last 72 hours. Urine analysis:    Component Value Date/Time   COLORURINE YELLOW 03/29/2017 2348   APPEARANCEUR HAZY (A) 03/29/2017 2348   LABSPEC 1.013 03/29/2017 2348   PHURINE 5.0 03/29/2017 2348   GLUCOSEU NEGATIVE 03/29/2017 2348   GLUCOSEU NEGATIVE 06/25/2016 1002   HGBUR NEGATIVE 03/29/2017 2348   HGBUR large 05/17/2008 1325   BILIRUBINUR NEGATIVE 03/29/2017 [place] 03/29/2017 2348   PROTEINUR NEGATIVE 03/29/2017 2348   UROBILINOGEN 0.2 06/25/2016 1002   NITRITE NEGATIVE 03/29/2017 2348   LEUKOCYTESUR LARGE (A) 03/29/2017 2348   Sepsis Labs: @LABRCNTIP (procalcitonin:4,lacticidven:4)  ) No results found for this or any previous visit (from the past 240 hour(s)).       Radiology Studies: US Abdomen Complete  Result Date: 04/12/2017 CLINICAL DATA:  Abdominal bloating. EXAM: ABDOMEN ULTRASOUND COMPLETE COMPARISON:  Abdominal CT 09/28/2016 FINDINGS: Gallbladder: Not visualized, fluid in the gallbladder fossa. Common bile duct: Diameter: 5 mm. Liver: No focal lesion identified. Increased in parenchymal echogenicity. No definite capsular irregularity. Portal vein is patent on color Doppler imaging with normal direction of blood flow towards the liver. IVC:  No abnormality visualized. Pancreas: Not well evaluated due to bowel gas. Spleen: Enlarged measuring 15.9 x 14.9 x 8.5 cm. Echogenic foci consistent with calcifications as seen on prior CT. Right Kidney: Length: 10.4 cm. Thinning of renal parenchyma with increased renal echogenicity. Complex cystic lesion in the mid kidney measures 1.8 cm with probable internal septations. Echogenic structures the hila likely vascular. No hydronephrosis. Left Kidney: Length: 9.0 cm. Thinning of renal parenchyma with increased renal echogenicity. No focal lesion. No hydronephrosis. Abdominal aorta: No aneurysm visualized. Technically limited evaluation due to bowel gas. Other findings: Moderate intra-abdominal ascites. IMPRESSION: 1. Moderate intra-abdominal ascites. 2. Increased hepatic echogenicity may reflect hepatic steatosis or other chronic liver disease. 3. Splenomegaly. 4. Bilateral renal atrophy with parenchymal thinning consistent with chronic medical renal  disease. Electronically Signed   By: Jeb Levering M.D.   On: 04/12/2017 22:18        Scheduled Meds: . amiodarone  200 mg Oral Daily  . atorvastatin  10 mg Oral q1800  . carvedilol  6.25 mg Oral BID WC  . darbepoetin (ARANESP) injection - DIALYSIS  60 mcg Intravenous Q Sat-HD  . escitalopram  10 mg Oral Daily  . feeding supplement (NEPRO CARB STEADY)  237 mL Oral TID BM  . gabapentin  300 mg Oral QHS  . Gerhardt's butt cream   Topical TID  . insulin aspart  0-9 Units Subcutaneous TID WC  . insulin NPH Human  10 Units Subcutaneous QAC breakfast  . ipratropium-albuterol  3 mL Nebulization Q6H  . levothyroxine  88 mcg Oral QAC breakfast  . loperamide  2 mg Oral Daily  . sodium chloride flush  3 mL Intravenous Q12H  . Warfarin - Pharmacist Dosing Inpatient   Does not apply q1800   Continuous Infusions: . sodium chloride    . heparin 1,300 Units/hr (04/14/17 0553)     LOS: 16 days    Time spent: 25 minutes    Kayleen Memos, MD [facility] Pager [phone]  If 7PM-7AM, please contact night-coverage [email or web address] Password [facility] 04/14/2017, 11:34 AM

## 2017-04-14 NOTE — Progress Notes (Signed)
  Progress Note    04/14/2017 12:35 PM * No surgery date entered *  Subjective:  No acute issues, breathing much improved  Vitals:   04/14/17 0802 04/14/17 0803  BP:    Pulse:  69  Resp:  17  Temp:    SpO2: 97% 97%    Physical Exam: aaox3 Non labored respirations Minimal edema rue Right IJ tunneled catheter in place   CBC    Component Value Date/Time   WBC 5.2 04/14/2017 0313   RBC 3.29 (L) 04/14/2017 0313   HGB 9.7 (L) 04/14/2017 0313   HGB 11.5 03/27/2017 1559   HGB 12.7 07/26/2011 1443   HCT 30.4 (L) 04/14/2017 0313   HCT 35.4 03/27/2017 1559   HCT 37.0 07/26/2011 1443   PLT 44 (L) 04/14/2017 0313   PLT 96 (LL) 03/27/2017 1559   MCV 92.4 04/14/2017 0313   MCV 93 03/27/2017 1559   MCV 91.5 07/26/2011 1443   MCH 29.5 04/14/2017 0313   MCHC 31.9 04/14/2017 0313   RDW 17.0 (H) 04/14/2017 0313   RDW 14.8 03/27/2017 1559   RDW 14.0 07/26/2011 1443   LYMPHSABS 0.9 03/27/2017 1559   LYMPHSABS 2.0 07/26/2011 1443   MONOABS 1.0 03/14/2017 1012   MONOABS 0.5 07/26/2011 1443   EOSABS 0.0 03/27/2017 1559   BASOSABS 0.0 03/27/2017 1559   BASOSABS 0.0 07/26/2011 1443    BMET    Component Value Date/Time   NA 132 (L) 04/13/2017 0326   NA 137 03/27/2017 1559   NA 135 (L) 10/15/2016 1037   K 4.1 04/13/2017 0326   K 4.1 10/15/2016 1037   CL 98 (L) 04/13/2017 0326   CO2 25 04/13/2017 0326   CO2 27 10/15/2016 1037   GLUCOSE 161 (H) 04/13/2017 0326   GLUCOSE 165 (H) 10/15/2016 1037   GLUCOSE 276 (H) 04/22/2006 0900   BUN 36 (H) 04/13/2017 0326   BUN 59 (H) 03/27/2017 1559   BUN 31.2 (H) 10/15/2016 1037   CREATININE 3.15 (H) 04/13/2017 0326   CREATININE 2.4 (H) 10/15/2016 1037   CALCIUM 8.1 (L) 04/13/2017 0326   CALCIUM 8.2 (L) 10/15/2016 1037   GFRNONAA 14 (L) 04/13/2017 0326   GFRAA 16 (L) 04/13/2017 0326    INR    Component Value Date/Time   INR 1.38 04/14/2017 0313   INR 2.6 07/18/2010 1336     Intake/Output Summary (Last 24 hours) at 04/14/2017  1235 Last data filed at 04/14/2017 0700 Gross per 24 hour  Intake 738.32 ml  Output -  Net 738.32 ml     Assessment:  76 y.o. female in need of permanent hd access, inr subtherapeutic   Plan: Post for tentative right arm avf vs avg tomorrow Hold coumadin, ok to continue heparin until call to OR Npo past midnight   Destiny [name] C. Donzetta Matters, MD [facility] [place] [facility] Office: [phone] Pager: [phone]  04/14/2017 12:35 PM

## 2017-04-14 NOTE — Progress Notes (Signed)
Background: 76 y.o.year-old w/complicated PMH DM, MO, h/o non-Hodgkins's lymphoma, chronic systolic/diastolic HF with Right HF, PAF (chronic anticoagulation), OHS/OSA on NIMV, pacer/ICD, CAD prior CABG, HLD, anemia on Aranesp. Known advanced CKD Stage 5 (GFR baseline 12-13 at last [facility] labs 02/19/17) followed by Dr. Marval Regal. Multiple prior AKI on CKD usually cardiorenal. Admitted 03/30/17 w/ progressive SOB, bilateral pleural effusions on CXR + R PNA. R thoracentesis of 900 ml on 11/19. Failed diuretics. HD initiated for new ESRD  1. New ESRDw/volume overload and progressive dyspnea(weight down 22 pounds) 1. [facility]/Dr Early has seen( for AVF &has left ICD so will need to be right side).For poss AVF monday 2. [facility]TT[facility]; last HD yesterday 3. Supporting BP with midodrine pre and mid  2. Systolic (EF 84-13) and diastolic HF with ECFV overload,post thoracentesis improved aeration-volume improved 4. 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 Acute on chronic hypoxic resp failure. Multifactorial & severe  8. Anemia- 2/2 CKD. s/p Aranesp 60 (gets Q4weeks at [facility]) on 03/27/17 and recent dose Feraheme.  9.H/o Non-Hodgkins lymphoma   Subjective: Interval History: none.  Objective: Vital signs in last 24 hours: Temp:  [98.5 F (36.9 C)-98.7 F (37.1 C)] 98.7 F (37.1 C) (12/02 1423) Pulse Rate:  [69-72] 70 (12/02 1423) Resp:  [17-20] 20 (12/02 1423) BP: (107-118)/(40-47) 107/40 (12/02 1423) SpO2:  [92 %-98 %] 95 % (12/02 1511) FiO2 (%):  [40 %] 40 % (12/02 0802) Weight:  [96.4 kg (212 lb 8.4 oz)] 96.4 kg (212 lb 8.4 oz) (12/02 0557) Weight change: -1.5 kg (-4.9 oz)  Intake/Output from previous day: 12/01 0701 - 12/02 0700 In: 738.3 [I.V.:738.3] Out: 2500  Intake/Output this shift: Total I/O In: 60 [P.O.:60] Out: -   General appearance: alert and cooperative Resp: clear to auscultation bilaterally Chest wall: no tenderness Cardio: regular  rate and rhythm, S1, S2 normal, no murmur, click, rub or gallop GI: obese, protub Extremities: much improved reduction in edema  Lab Results: Recent Labs    04/13/17 0326 04/14/17 0313  WBC 4.2 5.2  HGB 9.7* 9.7*  HCT 30.7* 30.4*  PLT 56* 44*   BMET:  Recent Labs    04/12/17 0736 04/13/17 0326  NA 131* 132*  K 4.8 4.1  CL 100* 98*  CO2 21* 25  GLUCOSE 112* 161*  BUN 34* 36*  CREATININE 2.70* 3.15*  CALCIUM 8.1* 8.1*   No results for input(s): PTH in the last 72 hours. Iron Studies: No results for input(s): IRON, TIBC, TRANSFERRIN, FERRITIN in the last 72 hours. Studies/Results: US Abdomen Complete  Result Date: 04/12/2017 CLINICAL DATA:  Abdominal bloating. EXAM: ABDOMEN ULTRASOUND COMPLETE COMPARISON:  Abdominal CT 09/28/2016 FINDINGS: Gallbladder: Not visualized, fluid in the gallbladder fossa. Common bile duct: Diameter: 5 mm. Liver: No focal lesion identified. Increased in parenchymal echogenicity. No definite capsular irregularity. Portal vein is patent on color Doppler imaging with normal direction of blood flow towards the liver. IVC: No abnormality visualized. Pancreas: Not well evaluated due to bowel gas. Spleen: Enlarged measuring 15.9 x 14.9 x 8.5 cm. Echogenic foci consistent with calcifications as seen on prior CT. Right Kidney: Length: 10.4 cm. Thinning of renal parenchyma with increased renal echogenicity. Complex cystic lesion in the mid kidney measures 1.8 cm with probable internal septations. Echogenic structures the hila likely vascular. No hydronephrosis. Left Kidney: Length: 9.0 cm. Thinning of renal parenchyma with increased renal echogenicity. No focal lesion. No hydronephrosis. Abdominal aorta: No aneurysm visualized. Technically limited evaluation due to bowel gas.  Other findings: Moderate intra-abdominal ascites. IMPRESSION: 1. Moderate intra-abdominal ascites. 2. Increased hepatic echogenicity may reflect hepatic steatosis or other chronic liver disease.  3. Splenomegaly. 4. Bilateral renal atrophy with parenchymal thinning consistent with chronic medical renal disease. Electronically Signed   By: Jeb Levering M.D.   On: 04/12/2017 22:18   Scheduled: . amiodarone  200 mg Oral Daily  . atorvastatin  10 mg Oral q1800  . carvedilol  6.25 mg Oral BID WC  . darbepoetin (ARANESP) injection - DIALYSIS  60 mcg Intravenous Q Sat-HD  . escitalopram  10 mg Oral Daily  . feeding supplement (NEPRO CARB STEADY)  237 mL Oral TID BM  . gabapentin  300 mg Oral QHS  . Gerhardt's butt cream   Topical TID  . insulin aspart  0-9 Units Subcutaneous TID WC  . insulin NPH Human  10 Units Subcutaneous QAC breakfast  . ipratropium-albuterol  3 mL Nebulization Q6H  . levothyroxine  88 mcg Oral QAC breakfast  . loperamide  2 mg Oral Daily  . sodium chloride flush  3 mL Intravenous Q12H  . Warfarin - Pharmacist Dosing Inpatient   Does not apply q1800      LOS: 16 days   Estanislado Emms 04/14/2017,3:48 PM

## 2017-04-14 NOTE — H&P (View-Only) (Signed)
  Progress Note    04/14/2017 12:35 PM * No surgery date entered *  Subjective:  No acute issues, breathing much improved  Vitals:   04/14/17 0802 04/14/17 0803  BP:    Pulse:  69  Resp:  17  Temp:    SpO2: 97% 97%    Physical Exam: aaox3 Non labored respirations Minimal edema rue Right IJ tunneled catheter in place   CBC    Component Value Date/Time   WBC 5.2 04/14/2017 0313   RBC 3.29 (L) 04/14/2017 0313   HGB 9.7 (L) 04/14/2017 0313   HGB 11.5 03/27/2017 1559   HGB 12.7 07/26/2011 1443   HCT 30.4 (L) 04/14/2017 0313   HCT 35.4 03/27/2017 1559   HCT 37.0 07/26/2011 1443   PLT 44 (L) 04/14/2017 0313   PLT 96 (LL) 03/27/2017 1559   MCV 92.4 04/14/2017 0313   MCV 93 03/27/2017 1559   MCV 91.5 07/26/2011 1443   MCH 29.5 04/14/2017 0313   MCHC 31.9 04/14/2017 0313   RDW 17.0 (H) 04/14/2017 0313   RDW 14.8 03/27/2017 1559   RDW 14.0 07/26/2011 1443   LYMPHSABS 0.9 03/27/2017 1559   LYMPHSABS 2.0 07/26/2011 1443   MONOABS 1.0 03/14/2017 1012   MONOABS 0.5 07/26/2011 1443   EOSABS 0.0 03/27/2017 1559   BASOSABS 0.0 03/27/2017 1559   BASOSABS 0.0 07/26/2011 1443    BMET    Component Value Date/Time   NA 132 (L) 04/13/2017 0326   NA 137 03/27/2017 1559   NA 135 (L) 10/15/2016 1037   K 4.1 04/13/2017 0326   K 4.1 10/15/2016 1037   CL 98 (L) 04/13/2017 0326   CO2 25 04/13/2017 0326   CO2 27 10/15/2016 1037   GLUCOSE 161 (H) 04/13/2017 0326   GLUCOSE 165 (H) 10/15/2016 1037   GLUCOSE 276 (H) 04/22/2006 0900   BUN 36 (H) 04/13/2017 0326   BUN 59 (H) 03/27/2017 1559   BUN 31.2 (H) 10/15/2016 1037   CREATININE 3.15 (H) 04/13/2017 0326   CREATININE 2.4 (H) 10/15/2016 1037   CALCIUM 8.1 (L) 04/13/2017 0326   CALCIUM 8.2 (L) 10/15/2016 1037   GFRNONAA 14 (L) 04/13/2017 0326   GFRAA 16 (L) 04/13/2017 0326    INR    Component Value Date/Time   INR 1.38 04/14/2017 0313   INR 2.6 07/18/2010 1336     Intake/Output Summary (Last 24 hours) at 04/14/2017  1235 Last data filed at 04/14/2017 0700 Gross per 24 hour  Intake 738.32 ml  Output -  Net 738.32 ml     Assessment:  76 y.o. female in need of permanent hd access, inr subtherapeutic   Plan: Post for tentative right arm avf vs avg tomorrow Hold coumadin, ok to continue heparin until call to OR Npo past midnight   Andrewjames Weirauch C. Donzetta Matters, MD [facility] [place] Office: [phone] Pager: [phone]  04/14/2017 12:35 PM

## 2017-04-14 NOTE — Progress Notes (Signed)
ANTICOAGULATION CONSULT NOTE - Follow Up Consult  Pharmacy Consult for Coumadin and Heparin bridge Indication: atrial fibrillation  Allergies  Allergen Reactions  . Sulfonamide Derivatives Other (See Comments)    UNSPECIFIED REACTION OF CHILDHOOD [per Mother]    Patient Measurements: Height: 5\' 4"  (162.6 cm) Weight: 212 lb 8.4 oz (96.4 kg) IBW/kg (Calculated) : 54.7 Heparin Dosing Weight: 80 kg  Vital Signs: Temp: 98.7 F (37.1 C) (12/02 1423) Temp Source: Oral (12/02 1423) BP: 107/40 (12/02 1423) Pulse Rate: 70 (12/02 1423)  Labs: Recent Labs    04/12/17 0736  04/12/17 0904  04/13/17 0326 04/13/17 1144 04/14/17 0313 04/14/17 1350  HGB  --    < > 9.9*  --  9.7*  --  9.7*  --   HCT  --   --  31.6*  --  30.7*  --  30.4*  --   PLT  --   --  49*  --  56*  --  44*  --   LABPROT 14.3  --   --   --  16.4*  --  16.9*  --   INR 1.12  --   --   --  1.33  --  1.38  --   HEPARINUNFRC  --   --   --    < >  --  0.50 0.22* 0.28*  CREATININE 2.70*  --   --   --  3.15*  --   --   --    < > = values in this interval not displayed.    Estimated Creatinine Clearance: 17.7 mL/min (A) (by C-G formula based on SCr of 3.15 mg/dL (H)).  Assessment:   76 yr old female on Coumadin for atrial fibrillation and history of mural thrombus.   INR therapeutic (2.71) on admit 11/16; INR up to 3.53 on 11/19 and Coumadin held. Vitamin K 5 mg PO given on 11/23 when INR down to 2.68.  IJ line placed 11/23 for dialysis access.  Coumadin had continued on hold, anticipating AVF placement 11/30, but procedure has been postponed, and Coumadin was resumed last night with 5 mg. Has been on sq heparin since 11/28, but now to change to IV heparin.  Hx thrombocytopenia. Platelet count 94 on admit, trended down to 49 on 11/30. No bleeding noted.  Discussed with Dr. Nevada Crane; plan IV heparin as long as platelet count remains >20, or if any s/sx bleeding.    Heparin begun conservatively on 11/30. Heparin level therapeutic  (0.50) on 12/1, but subtherapeutic (0.22) this morning on 1200 units/hr and rate increased to 1300 units/hr. Heparin level remains low this afternoon (0.28).  Platelet count 44K. No bleeding reported.    INR 1.38 after Coumadin 5 mg x 3 days.  Coumadin now on hold for AV fistula tomorrow.    Coumadin regimen PTA:  2.5 mg daily except 5 mg on Mondays.  Goal of Therapy:  INR 2-3 Heparin level 0.3-0.5 units/ml (lower end of therapeutic range) Monitor platelets by anticoagulation protocol: Yes   Plan:   Increase heparin drip to 1400 units/hr.  Heparin level ~8hrs after increase.  Noted heparin can continue to OR on 12/3 per Dr. Donzetta Matters.  Target low therapeutic heparin levels.  No Coumadin today; on hold for procedure 12/3  Daily heparin level, PT/INR and CBC.  Monitor for an s/sx bleeding.  Arty Baumgartner, [place] Pager: [phone] 04/14/2017,4:10 PM

## 2017-04-14 NOTE — Progress Notes (Signed)
ANTICOAGULATION CONSULT NOTE - Follow Up Consult  Pharmacy Consult for Coumadin and Heparin bridge Indication: atrial fibrillation  Allergies  Allergen Reactions  . Sulfonamide Derivatives Other (See Comments)    UNSPECIFIED REACTION OF CHILDHOOD [per Mother]   Patient Measurements: Height: 5\' 4"  (162.6 cm) Weight: 213 lb 13.5 oz (97 kg) IBW/kg (Calculated) : 54.7 Heparin Dosing Weight: 80 kg  Vital Signs: Temp: 98.5 F (36.9 C) (12/01 2102) Temp Source: Axillary (12/01 2102) BP: 118/41 (12/01 2102) Pulse Rate: 71 (12/01 2102)  Labs: Recent Labs    04/12/17 0736  04/12/17 0904 04/12/17 2256 04/13/17 0326 04/13/17 1144 04/14/17 0313  HGB  --    < > 9.9*  --  9.7*  --  9.7*  HCT  --   --  31.6*  --  30.7*  --  30.4*  PLT  --   --  49*  --  56*  --  44*  LABPROT 14.3  --   --   --  16.4*  --  16.9*  INR 1.12  --   --   --  1.33  --  1.38  HEPARINUNFRC  --   --   --  0.17*  --  0.50 0.22*  CREATININE 2.70*  --   --   --  3.15*  --   --    < > = values in this interval not displayed.    Estimated Creatinine Clearance: 17.7 mL/min (A) (by C-G formula based on SCr of 3.15 mg/dL (H)).  Assessment: 76 yr old female on PTA Coumadin for atrial fibrillation and history of mural thrombus. Pharmacy consulted to dose heparin bridge to Coumadin (previously on hold this admission for procedures).  Heparin level is subtherapeutic for low goal at 0.22 and no issues with infusion per RN. Hgb stable and plts downtrending from 56 > 44. Patient with chronic thrombocytopenia and MD aware per notes. RN reports no concerns with bleeding at this time.   Aiming for low goal given thrombocytopenia.   Goal of Therapy:  INR 2-3 Heparin level 0.3-0.5 units/ml (lower end of therapeutic range) Monitor platelets by anticoagulation protocol: Yes   Plan:  Increase heparin gtt to 1300 units/hr Heparin level in 8 hrs Daily heparin level and CBC Monitor for s/s bleeding   Lavonda Jumbo,  PharmD Clinical Pharmacist 04/14/17 5:39 AM

## 2017-04-15 ENCOUNTER — Telehealth: Payer: Self-pay | Admitting: Vascular Surgery

## 2017-04-15 ENCOUNTER — Inpatient Hospital Stay (HOSPITAL_COMMUNITY): Payer: Medicare Other | Admitting: Certified Registered Nurse Anesthetist

## 2017-04-15 ENCOUNTER — Encounter (HOSPITAL_COMMUNITY)
Admission: EM | Disposition: A | Discharge status: Hospice | Payer: Self-pay | Source: Home / Self Care | Attending: Internal Medicine

## 2017-04-15 DIAGNOSIS — I770 Arteriovenous fistula, acquired: Secondary | ICD-10-CM

## 2017-04-15 DIAGNOSIS — N185 Chronic kidney disease, stage 5: Secondary | ICD-10-CM

## 2017-04-15 HISTORY — PX: AV FISTULA PLACEMENT: SHX1204

## 2017-04-15 LAB — CBC
HCT: 31.5 % — ABNORMAL LOW (ref 36.0–46.0)
Hemoglobin: 9.8 g/dL — ABNORMAL LOW (ref 12.0–15.0)
MCH: 28.8 pg (ref 26.0–34.0)
MCHC: 31.1 g/dL (ref 30.0–36.0)
MCV: 92.6 fL (ref 78.0–100.0)
Platelets: 66 10*3/uL — ABNORMAL LOW (ref 150–400)
RBC: 3.4 MIL/uL — ABNORMAL LOW (ref 3.87–5.11)
RDW: 17.3 % — ABNORMAL HIGH (ref 11.5–15.5)
WBC: 6 10*3/uL (ref 4.0–10.5)

## 2017-04-15 LAB — GLUCOSE, CAPILLARY
GLUCOSE-CAPILLARY: 130 mg/dL — AB (ref 65–99)
GLUCOSE-CAPILLARY: 150 mg/dL — AB (ref 65–99)
Glucose-Capillary: 134 mg/dL — ABNORMAL HIGH (ref 65–99)
Glucose-Capillary: 145 mg/dL — ABNORMAL HIGH (ref 65–99)
Glucose-Capillary: 152 mg/dL — ABNORMAL HIGH (ref 65–99)

## 2017-04-15 LAB — HEPARIN LEVEL (UNFRACTIONATED)
HEPARIN UNFRACTIONATED: 0.43 [IU]/mL (ref 0.30–0.70)
Heparin Unfractionated: 0.29 IU/mL — ABNORMAL LOW (ref 0.30–0.70)

## 2017-04-15 LAB — PROTIME-INR
INR: 1.56
PROTHROMBIN TIME: 18.5 s — AB (ref 11.4–15.2)

## 2017-04-15 SURGERY — ARTERIOVENOUS (AV) FISTULA CREATION
Anesthesia: Monitor Anesthesia Care | Laterality: Right

## 2017-04-15 MED ORDER — WARFARIN SODIUM 5 MG PO TABS
5.0000 mg | ORAL_TABLET | Freq: Once | ORAL | Status: AC
  Administered 2017-04-16: 5 mg via ORAL
  Filled 2017-04-15 (×2): qty 1

## 2017-04-15 MED ORDER — LIDOCAINE-EPINEPHRINE (PF) 1 %-1:200000 IJ SOLN
INTRAMUSCULAR | Status: DC | PRN
  Administered 2017-04-15: 30 mL

## 2017-04-15 MED ORDER — FENTANYL CITRATE (PF) 100 MCG/2ML IJ SOLN: 25.0000 ug | INTRAMUSCULAR | Status: DC | PRN

## 2017-04-15 MED ORDER — ONDANSETRON HCL 4 MG/2ML IJ SOLN
INTRAMUSCULAR | Status: DC | PRN
  Administered 2017-04-15: 4 mg via INTRAVENOUS

## 2017-04-15 MED ORDER — PROPOFOL 500 MG/50ML IV EMUL
INTRAVENOUS | Status: DC | PRN
  Administered 2017-04-15: 30 ug/kg/min via INTRAVENOUS

## 2017-04-15 MED ORDER — 0.9 % SODIUM CHLORIDE (POUR BTL) OPTIME
TOPICAL | Status: DC | PRN
  Administered 2017-04-15: 1000 mL

## 2017-04-15 MED ORDER — HEPARIN (PORCINE) IN NACL 100-0.45 UNIT/ML-% IJ SOLN
1400.0000 [IU]/h | INTRAMUSCULAR | Status: DC
  Administered 2017-04-16: 1500 [IU]/h via INTRAVENOUS
  Administered 2017-04-17: 1400 [IU]/h via INTRAVENOUS
  Filled 2017-04-15: qty 250

## 2017-04-15 MED ORDER — PHENYLEPHRINE HCL 10 MG/ML IJ SOLN
INTRAMUSCULAR | Status: DC | PRN
  Administered 2017-04-15: 120 ug via INTRAVENOUS
  Administered 2017-04-15: 80 ug via INTRAVENOUS

## 2017-04-15 MED ORDER — SODIUM CHLORIDE 0.9 % IV SOLN
INTRAVENOUS | Status: DC | PRN
  Administered 2017-04-15: 500 mL

## 2017-04-15 MED ORDER — CEFUROXIME SODIUM 1.5 G IV SOLR
INTRAVENOUS | Status: AC
  Filled 2017-04-15: qty 1.5

## 2017-04-15 MED ORDER — CEFAZOLIN SODIUM-DEXTROSE 2-4 GM/100ML-% IV SOLN
INTRAVENOUS | Status: AC
  Filled 2017-04-15: qty 100

## 2017-04-15 MED ORDER — SODIUM CHLORIDE 0.9 % IV SOLN
INTRAVENOUS | Status: DC
  Administered 2017-04-15: 10:00:00 via INTRAVENOUS

## 2017-04-15 MED ORDER — CEFUROXIME SODIUM 1.5 G IV SOLR
1.5000 g | Freq: Once | INTRAVENOUS | Status: AC
  Administered 2017-04-15: 1.5 g via INTRAVENOUS

## 2017-04-15 MED ORDER — FENTANYL CITRATE (PF) 250 MCG/5ML IJ SOLN
INTRAMUSCULAR | Status: AC
  Filled 2017-04-15: qty 5

## 2017-04-15 SURGICAL SUPPLY — 34 items
ADH SKN CLS APL DERMABOND .7 (GAUZE/BANDAGES/DRESSINGS) ×1
ARMBAND PINK RESTRICT EXTREMIT (MISCELLANEOUS) ×6 IMPLANT
CANISTER SUCT 3000ML PPV (MISCELLANEOUS) ×3 IMPLANT
CANNULA VESSEL 3MM 2 BLNT TIP (CANNULA) ×3 IMPLANT
CLIP LIGATING EXTRA MED SLVR (CLIP) ×3 IMPLANT
CLIP LIGATING EXTRA SM BLUE (MISCELLANEOUS) ×3 IMPLANT
COVER PROBE W GEL 5X96 (DRAPES) ×3 IMPLANT
DECANTER SPIKE VIAL GLASS SM (MISCELLANEOUS) ×3 IMPLANT
DERMABOND ADVANCED (GAUZE/BANDAGES/DRESSINGS) ×2
DERMABOND ADVANCED .7 DNX12 (GAUZE/BANDAGES/DRESSINGS) ×1 IMPLANT
ELECT REM PT RETURN 9FT ADLT (ELECTROSURGICAL) ×3
ELECTRODE REM PT RTRN 9FT ADLT (ELECTROSURGICAL) ×1 IMPLANT
GLOVE BIO SURGEON STRL SZ 6.5 (GLOVE) ×2 IMPLANT
GLOVE BIO SURGEON STRL SZ7 (GLOVE) ×3 IMPLANT
GLOVE BIO SURGEONS STRL SZ 6.5 (GLOVE) ×1
GLOVE BIOGEL PI IND STRL 6 (GLOVE) ×1 IMPLANT
GLOVE BIOGEL PI IND STRL 6.5 (GLOVE) ×1 IMPLANT
GLOVE BIOGEL PI INDICATOR 6 (GLOVE) ×2
GLOVE BIOGEL PI INDICATOR 6.5 (GLOVE) ×2
GLOVE SS BIOGEL STRL SZ 7.5 (GLOVE) ×1 IMPLANT
GLOVE SUPERSENSE BIOGEL SZ 7.5 (GLOVE) ×2
GOWN STRL REUS W/ TWL LRG LVL3 (GOWN DISPOSABLE) ×3 IMPLANT
GOWN STRL REUS W/TWL LRG LVL3 (GOWN DISPOSABLE) ×9
KIT BASIN OR (CUSTOM PROCEDURE TRAY) ×3 IMPLANT
KIT ROOM TURNOVER OR (KITS) ×3 IMPLANT
NS IRRIG 1000ML POUR BTL (IV SOLUTION) ×3 IMPLANT
PACK CV ACCESS (CUSTOM PROCEDURE TRAY) ×3 IMPLANT
PAD ARMBOARD 7.5X6 YLW CONV (MISCELLANEOUS) ×6 IMPLANT
SUT PROLENE 6 0 CC (SUTURE) ×6 IMPLANT
SUT VIC AB 3-0 SH 27 (SUTURE) ×3
SUT VIC AB 3-0 SH 27X BRD (SUTURE) ×1 IMPLANT
TOWEL GREEN STERILE (TOWEL DISPOSABLE) ×3 IMPLANT
UNDERPAD 30X30 (UNDERPADS AND DIAPERS) ×3 IMPLANT
WATER STERILE IRR 1000ML POUR (IV SOLUTION) ×3 IMPLANT

## 2017-04-15 NOTE — Progress Notes (Signed)
Placed on tele to monitor  

## 2017-04-15 NOTE — Telephone Encounter (Signed)
Sched lab 05/24/16 at 4:00 and MD 05/28/16 at 9:30. Lm on hm#.

## 2017-04-15 NOTE — Transfer of Care (Signed)
Immediate Anesthesia Transfer of Care Note  Patient: Destiny [name]  Procedure(s) Performed: ARTERIOVENOUS (AV) FISTULA CREATION RIGHT ARM (Right )  Patient Location: PACU  Anesthesia Type:MAC  Level of Consciousness: awake, alert  and patient cooperative  Airway & Oxygen Therapy: Patient Spontanous Breathing and Patient connected to face mask oxygen  Post-op Assessment: Report given to RN and Post -op Vital signs reviewed and stable  Post vital signs: Reviewed and stable  Last Vitals:  Vitals:   04/15/17 0900 04/15/17 1240  BP:  (P) 133/60  Pulse:    Resp:    Temp:    SpO2: 91%     Last Pain:  Vitals:   04/15/17 0906  TempSrc:   PainSc: 0-No pain      Patients Stated Pain Goal: 0 (12/26/46 1856)  Complications: No apparent anesthesia complications

## 2017-04-15 NOTE — Progress Notes (Signed)
PROGRESS NOTE    Destiny [name]  NWG:[id number] DOB: June 13, 1940 DOA: 03/29/2017 PCP: Mosie Lukes, MD    Brief Narrative:  76 yo F CHF EF 20% with AICD, COPD on home oxygen of 2 L, CKD stage IV, anticoagulation with Coumadin with prior LV mural thrombus, and DM who presents with few days dyspnea.   Treated with 4 days ceftriaxone and azithromycin, no improvement.  Attempted diuresis but very little urine output, so started dialysis and tolerating well.  Pt seen and examined with her husband at her bedside. She denies any chest pain, palpitations or dyspnea while laying still in the bed. The writer spoke with the vascular team. Plan is to possibly obtain fistula in the outpatient setting. Not a good candidate at this time due to very poor overall health. Patient back on coumadin and being bridged with pharmacy management. Patient is very deconditioned and get dyspneic even with minimal exertion as with sitting up in bed. Palliative care consulted.  No acute events overnight. POD # 0 post AV fistula placement.  Pt seen and examined at her bedside with husband present. Has no acute complaints. Right AV fistula in place.  Assessment & Plan:  Principal Problem:   Acute on chronic combined systolic and diastolic CHF (congestive heart failure) (HCC) Active Problems:   Diabetes mellitus type 2 with retinopathy ([place])   Hyperlipidemia   Essential hypertension   Automatic implantable cardioverter-defibrillator in situ   Hypothyroidism   Anemia   Morbid obesity (HCC)   COPD, severe (HCC)   CKD (chronic kidney disease) stage 4, GFR 15-29 ml/min ([place])   Community acquired pneumonia   Acute hypoxemic respiratory failure (HCC)   Pressure injury of skin   Acute on chronic hypoxic respiratory failure most likely 2/2 to chronic systolic congestive heart failure EF 20%, pleural effusion, fluid overload -Failed diuretics, elected to begin HD in consultation with Nephrology.  -respiratory status  improved after dialysis -Temp cath in [facility] present -HD per Nephrology -R AV fistula placed in 04/15/17 -right thoracentesis with 900 cc body fluid removed 04/02/17. Pleural fluid with elevated LDH, low protein.  Meets Light's criteria, but suspect this is not parapneumonic. 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 -warfarin, amiodarone. -resume warfarin, to bridge with heparin drip -Pharmacy management of heparin drip appreciated -Continue amiodarone, Coreg -INR 1.38 from 1.33 from 1.12 -cbc, INR am  Subtherapeutic INR -improving INR 1.38 from 1.33 from INR 1.12 (04/12/17) -coumadin bridged with heparin drip -pharmacy management of coumadin and heparin drip -INR am  Chronic thrombocytopenia -stable -plt 44 from 56 from 49 from 58k -no sign of gross bleeding -if continues to trend down consult hematology -peripheral smear ordered -In setting of NHL in remission, followed by Dr. Benay Spice.   -No cirrhosis on last CT imaging.  -cbc am  Abdominal distention 2/2 to moderate ascites, suspected hepatic steatosis vs chronic liver disease -abdominal ultrasound 04/12/17: 1. Moderate intra-abdominal ascites. 2. Increased hepatic echogenicity may reflect hepatic steatosis or other chronic liver disease. 3. Splenomegaly. 4. Bilateral renal atrophy with parenchymal thinning consistent with chronic medical renal disease.  -hepatitis panel, hep B HIV negative (03/2017) -cmp am   Normocytic anemia -most likely 2/2 to anemia of chronic disease with ESRD -hg stable at 9.7. Baseline 9-10 -no  sign of overt bleeding -cbc am  Acute on chronic systolic CHF -As above -Continue aspirin, statin, BB, HD  Hyponatremia -Improving -Na+ 132 from 131 from  133 -fluid restriction -renal diet with 1200 cc fluid restriction -BMP am  Deconditioning/ambulatory dysfunction -PT/OT evaluate and treat -OOB to chair with every shift -fall precaution  Suspected Community Acquired pneumonia, present on admission Completed course of antibiotics.    Chronic COPD -Continue bronchodilators PRN  Diabetes -Continu NPH -Continue SSI -bmp  Hypothyroidism -Continue levothyroxine  Other medications -Continue Bentyl -Continue Lexapro -Continue gabapentin, dose decreased now on HD  Elevated troponin Heart stretch in ESRD, doubt ACS Peaked at 0.04 (04/05/17)  DVT prophylaxis: heparin drip bridging coumadin Code Status: FULL Family Communication: No family member at bedside Disposition Plan: will stay another midnight to improve INR level.   Consultants:   Nephrology, EP, IR  Procedures:   Thoracentesis  HD  Right AV fistula placement 04/15/17  Antimicrobials:   Ceftraixone and azithromycin, completed 4 days    Objective: Vitals:   04/15/17 1435 04/15/17 1440 04/15/17 1441 04/15/17 1501  BP:   (!) 104/51 101/62  Pulse:  73  72  Resp:  (!) 21  18  Temp: (!) 97 F (36.1 C)   (!) 97.5 F (36.4 C)  TempSrc:    Axillary  SpO2:  95%  96%  Weight:      Height:        Intake/Output Summary (Last 24 hours) at 04/15/2017 1505 Last data filed at 04/15/2017 1400 Gross per 24 hour  Intake 731.17 ml  Output 5 ml  Net 726.17 ml   Filed Weights   04/13/17 1105 04/14/17 0557 04/15/17 0459  Weight: 97 kg (213 lb 13.5 oz) 96.4 kg (212 lb 8.4 oz) 99.8 kg (220 lb 0.3 oz)    Examination: General appearance: Elderly obese adult female, interactive, oriented just appears extremely tired.   HEENT: Anicteric, conjunctiva pink, lids and lashes normal. No more periorbital puffiness.  No nasal deformity, discharge, epistaxis.  Lips moist.   Skin: Warm and dry.  No jaundice.  No suspicious rashes or lesions.  Periorbital edema  improved. Cardiac: RRR, nl S1-S2, SEM noted.  Capillary refill is brisk.  JVP not visible.  Trace LE edema.  Radial pulses 2+ and symmetric. Respiratory: labored breathing, appears tired.  Diminshed thoughout, no wheezes. Abdomen: Abdomen soft.  No TTP. No ascites, distension, hepatosplenomegaly.   MSK: No lower extremity edema. 2/4 pulses at dorsalis pedis bilaterally. Right AV fistula at crease of right arm. Neuro: Oriented, globally very weak.  Facies symmetric, moves upper exttremities equally. Psych: Affect flat.   Data Reviewed: I have personally reviewed following labs and imaging studies:  CBC: Recent Labs  Lab 04/11/17 0310 04/12/17 0904 04/13/17 0326 04/14/17 0313 04/15/17 0514  WBC 6.9 4.2 4.2 5.2 6.0  HGB 10.0* 9.9* 9.7* 9.7* 9.8*  HCT 31.1* 31.6* 30.7* 30.4* 31.5*  MCV 90.7 91.1 91.4 92.4 92.6  PLT 58* 49* 56* 44* 66*   Basic Metabolic Panel: Recent Labs  Lab 04/09/17 0004 04/09/17 0549 04/10/17 0456 04/11/17 0310 04/12/17 0736 04/13/17 0326  NA 134* 133* 133* 133* 131* 132*  K 4.2 4.3 4.1 3.6 4.8 4.1  CL 99* 99* 99* 99* 100* 98*  CO2 28 27 25 26  21* 25  GLUCOSE 110* 131* 110* 94 112* 161*  BUN 33* 44* 48* 26* 34* 36*  CREATININE 2.04* 2.70* 2.76* 2.04* 2.70* 3.15*  CALCIUM 7.8* 8.2* 8.2* 7.9* 8.1* 8.1*  PHOS  2.6 3.8 3.7  --   --   --    GFR: Estimated Creatinine Clearance: 18 mL/min (A) (by C-G formula based on SCr of 3.15 mg/dL (H)). Liver Function Tests: Recent Labs  Lab 04/09/17 0004 04/09/17 0549 04/10/17 0456 04/13/17 0326  AST  --   --   --  33  ALT  --   --   --  13*  ALKPHOS  --   --   --  98  BILITOT  --   --   --  0.8  PROT  --   --   --  4.6*  ALBUMIN 2.8* 2.5* 2.4* 2.2*   No results for input(s): LIPASE, AMYLASE in the last 168 hours. No results for input(s): AMMONIA in the last 168 hours. Coagulation Profile: Recent Labs  Lab 04/12/17 0736 04/13/17 0326 04/14/17 0313 04/15/17 0514  INR 1.12 1.33 1.38 1.56   Cardiac  Enzymes: No results for input(s): CKTOTAL, CKMB, CKMBINDEX, TROPONINI in the last 168 hours. BNP (last 3 results) No results for input(s): PROBNP in the last 8760 hours. HbA1C: No results for input(s): HGBA1C in the last 72 hours. CBG: Recent Labs  Lab 04/14/17 1708 04/14/17 2222 04/15/17 0725 04/15/17 1035 04/15/17 1248  GLUCAP 146* 176* 130* 134* 150*   Lipid Profile: No results for input(s): CHOL, HDL, LDLCALC, TRIG, CHOLHDL, LDLDIRECT in the last 72 hours. Thyroid Function Tests: No results for input(s): TSH, T4TOTAL, FREET4, T3FREE, THYROIDAB in the last 72 hours. Anemia Panel: No results for input(s): VITAMINB12, FOLATE, FERRITIN, TIBC, IRON, RETICCTPCT in the last 72 hours. Urine analysis:    Component Value Date/Time   COLORURINE YELLOW 03/29/2017 2348   APPEARANCEUR HAZY (A) 03/29/2017 2348   LABSPEC 1.013 03/29/2017 2348   PHURINE 5.0 03/29/2017 2348   GLUCOSEU NEGATIVE 03/29/2017 2348   GLUCOSEU NEGATIVE 06/25/2016 1002   HGBUR NEGATIVE 03/29/2017 2348   HGBUR large 05/17/2008 1325   BILIRUBINUR NEGATIVE 03/29/2017 [place] 03/29/2017 2348   PROTEINUR NEGATIVE 03/29/2017 2348   UROBILINOGEN 0.2 06/25/2016 1002   NITRITE NEGATIVE 03/29/2017 2348   LEUKOCYTESUR LARGE (A) 03/29/2017 2348   Sepsis Labs: @LABRCNTIP (procalcitonin:4,lacticidven:4)  ) No results found for this or any previous visit (from the past 240 hour(s)).       Radiology Studies: No results found.      Scheduled Meds: . amiodarone  200 mg Oral Daily  . atorvastatin  10 mg Oral q1800  . carvedilol  6.25 mg Oral BID WC  . darbepoetin (ARANESP) injection - DIALYSIS  60 mcg Intravenous Q Sat-HD  . escitalopram  10 mg Oral Daily  . feeding supplement (NEPRO CARB STEADY)  237 mL Oral TID BM  . gabapentin  300 mg Oral QHS  . Gerhardt's butt cream   Topical TID  . insulin aspart  0-9 Units Subcutaneous TID WC  . insulin NPH Human  10 Units Subcutaneous QAC breakfast   . ipratropium-albuterol  3 mL Nebulization Q6H  . levothyroxine  88 mcg Oral QAC breakfast  . loperamide  2 mg Oral Daily  . multivitamin  1 tablet Oral QHS  . sodium chloride flush  3 mL Intravenous Q12H  . Warfarin - Pharmacist Dosing Inpatient   Does not apply q1800   Continuous Infusions: . sodium chloride    . sodium chloride 10 mL/hr at 04/15/17 1026  . heparin 1,500 Units/hr (04/15/17 0716)     LOS: 17 days    Time spent: 25 minutes  Kayleen Memos, MD [facility] Pager [phone]  If 7PM-7AM, please contact night-coverage [email or web address] Password [email or web address] 04/15/2017, 11:34 AM

## 2017-04-15 NOTE — Interval H&P Note (Signed)
History and Physical Interval Note:  04/15/2017 10:53 AM  Destiny [name]  has presented today for surgery, with the diagnosis of ESRD  The various methods of treatment have been discussed with the patient and family. After consideration of risks, benefits and other options for treatment, the patient has consented to  Procedure(s): ARTERIOVENOUS (AV) FISTULA CREATION VS. GRAFT RIGHT ARM (Right) as a surgical intervention .  The patient's history has been reviewed, patient examined, no change in status, stable for surgery.  I have reviewed the patient's chart and labs.  Questions were answered to the patient's satisfaction.     Curt Jews

## 2017-04-15 NOTE — Care Management Important Message (Signed)
Important Message  Patient Details  Name: Destiny [name] MRN: [id number] Date of Birth: 07/20/1940   Medicare Important Message Given:  Yes    Hensley Treat Abena 04/15/2017, 11:03 AM

## 2017-04-15 NOTE — Progress Notes (Signed)
ANTICOAGULATION CONSULT NOTE - Follow Up Consult  Pharmacy Consult for Coumadin and Heparin bridge Indication: atrial fibrillation  Allergies  Allergen Reactions  . Sulfonamide Derivatives Other (See Comments)    UNSPECIFIED REACTION OF CHILDHOOD [per Mother]    Patient Measurements: Height: 5\' 4"  (162.6 cm) Weight: 212 lb 8.4 oz (96.4 kg) IBW/kg (Calculated) : 54.7 Heparin Dosing Weight: 80 kg  Vital Signs: Temp: 98.2 F (36.8 C) (12/02 2130) Temp Source: Oral (12/02 2130) BP: 106/46 (12/02 2130) Pulse Rate: 70 (12/02 2130)  Labs: Recent Labs    04/12/17 0736  04/12/17 0904  04/13/17 0326  04/14/17 0313 04/14/17 1350 04/15/17 0006  HGB  --    < > 9.9*  --  9.7*  --  9.7*  --   --   HCT  --   --  31.6*  --  30.7*  --  30.4*  --   --   PLT  --   --  49*  --  56*  --  44*  --   --   LABPROT 14.3  --   --   --  16.4*  --  16.9*  --   --   INR 1.12  --   --   --  1.33  --  1.38  --   --   HEPARINUNFRC  --   --   --    < >  --    < > 0.22* 0.28* 0.29*  CREATININE 2.70*  --   --   --  3.15*  --   --   --   --    < > = values in this interval not displayed.    Estimated Creatinine Clearance: 17.7 mL/min (A) (by C-G formula based on SCr of 3.15 mg/dL (H)).  Assessment: 76 yr old female onPTACoumadin for atrial fibrillation and history of mural thrombus. Pharmacy consulted to dose heparin bridge to Coumadin (previously on hold this admission for procedures).  Heparin level slightly subtherapeutic at 0.29 (drawn at ~6 hrs) and no infusion issues per RN. No new CBC, however, no bleeding reported.   Noted Coumadin now on hold for procedure per Vascular Surgery.   Coumadin regimen PTA:  2.5 mg daily except 5 mg on Mondays.  Goal of Therapy:  INR 2-3 Heparin level 0.3-0.5 units/ml (lower end of therapeutic range) Monitor platelets by anticoagulation protocol: Yes   Plan:  Increase heparin gtt to 1500 units/hr Heparin level in 8 hrs Daily heparin level and  CBC Monitor for s/s bleeding F/u surgery/postoperative plans  Lavonda Jumbo, PharmD Clinical Pharmacist 04/15/17 12:53 AM

## 2017-04-15 NOTE — Op Note (Signed)
    OPERATIVE REPORT  DATE OF SURGERY: 04/15/2017  PATIENT: Destiny [name], 76 y.o. female MRN: [id number]  DOB: 1940-07-30  PRE-OPERATIVE DIAGNOSIS: End-stage renal disease  POST-OPERATIVE DIAGNOSIS:  Same  PROCEDURE: Right upper arm brachiocephalic AV fistula creation  SURGEON:  Curt Jews, M.D.  PHYSICIAN ASSISTANT: Matt Eveland PA-C  ANESTHESIA: Local with sedation  EBL: Minimal ml  Total I/O In: 300 [I.V.:300] Out: 5 [Blood:5]  BLOOD ADMINISTERED: None  DRAINS: None  SPECIMEN: None  COUNTS CORRECT:  YES  PLAN OF CARE: PACU  PATIENT DISPOSITION:  PACU - hemodynamically stable  PROCEDURE DETAILS: The patient was taken to the operating placed in supine position where the area of the right arm was prepped and draped in the usual sterile fashion.  SonoSite ultrasound was used to visualize the cephalic vein which was of good caliber at the antecubital space.  Much smaller in the forearm.  Using local anesthesia incision was carried down to isolate the cephalic vein which was of good caliber.  Tributary branches were ligated with 3-0 and 4-0 silk ties and divided.  The brachial artery was identified through the same incision and was of good caliber.  The vein was ligated distally and divided.  The vein was gently dilated.  The brachial artery was occluded proximally distally and a small arteriotomy was created the vein was cut to the appropriate length and was spatulated and sewn end-to-side to the artery with a running 6-0 Prolene suture.  Clamps were removed and excellent thrill was noted.  The wounds were irrigated with saline and hemostasis obtained with cautery.  The patient did not have a palpable radial pulse at the beginning the procedure.  Did have good biphasic flow with only minimal augmentation with occlusion of the fistula in the radial and ulnar left wrist.  The wound was closed with 3-0 Vicryl in the subcutaneous tissue.  Sterile dressing was applied and the patient  was transferred to the recovery room in stable condition   Rosetta Posner, M.D., [facility] 04/15/2017 12:34 PM

## 2017-04-15 NOTE — Progress Notes (Signed)
Report given to philip rn as caregiver 

## 2017-04-15 NOTE — Progress Notes (Signed)
Subjective:  Back from the OR s/p AVF placement- she looks terrible  Objective Vital signs in last 24 hours: Vitals:   04/15/17 1435 04/15/17 1440 04/15/17 1441 04/15/17 1501  BP:   (!) 104/51 101/62  Pulse:  73  72  Resp:  (!) 21  18  Temp: (!) 97 F (36.1 C)   (!) 97.5 F (36.4 C)  TempSrc:    Axillary  SpO2:  95%  96%  Weight:      Height:       Weight change: 0.2 kg (7.1 oz)  Intake/Output Summary (Last 24 hours) at 04/15/2017 1540 Last data filed at 04/15/2017 1400 Gross per 24 hour  Intake 731.17 ml  Output 5 ml  Net 726.17 ml    Background: 76 y.o.year-old w/complicated PMH DM, MO, h/o NHL, chronic systolic/diastolic HF with Right HF, PAF on anticoagulation, OHS/OSA on NIMV, pacer/ICD, CAD prior CABG, HLD,  Known advanced CKD Stage 5  followed by Dr. Marval Regal PTA. Multiple prior AKI on CKD usually cardiorenal. Admitted 03/30/17 w/ progressive SOB, bilateral pleural effusions on CXR + R PNA. R thoracentesis of 900 ml on 11/19. Failed diuretics. HD initiated   1. New ESRDw/volume overload and progressive dyspnea(weight down 22 pounds) 1. [facility]/Dr Early has seen( for AVF &has left ICD so will need to be right side).S/P AVF today 2. [facility]; last HD Sat- will plan for tomorrow here, then can go to [place] as soon as Thursday- I have doubts as to whether pt can be strong enough to withstand continued HD- for OP she will need to be able to sit up for about 6 hours and she has not done anything near to that.  Palliative care met with them, they want to continue everything- not sure is realistic  3. Supporting BP with midodrine pre and mid  2. Systolic (EF 76-54) and diastolic HF with ECFV overload,post thoracentesis improved aeration-volume improved.  On midodrine as well as coreg low dose - still overloaded  4. Thrombocytopenia plts chronically low- 66 today 5. 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 Acute on chronic hypoxic resp failure. Multifactorial  & severe  8. Anemia- 2/2 CKD. s/p Aranesp 60 (gets Q4weeks at [facility]) on 03/27/17 and recent dose Feraheme.       Destiny [name] A    Labs: Basic Metabolic Panel: Recent Labs  Lab 04/09/17 0004 04/09/17 0549 04/10/17 0456 04/11/17 0310 04/12/17 0736 04/13/17 0326  NA 134* 133* 133* 133* 131* 132*  K 4.2 4.3 4.1 3.6 4.8 4.1  CL 99* 99* 99* 99* 100* 98*  CO2 '28 27 25 26 '$ 21* 25  GLUCOSE 110* 131* 110* 94 112* 161*  BUN 33* 44* 48* 26* 34* 36*  CREATININE 2.04* 2.70* 2.76* 2.04* 2.70* 3.15*  CALCIUM 7.8* 8.2* 8.2* 7.9* 8.1* 8.1*  PHOS 2.6 3.8 3.7  --   --   --    Liver Function Tests: Recent Labs  Lab 04/09/17 0549 04/10/17 0456 04/13/17 0326  AST  --   --  33  ALT  --   --  13*  ALKPHOS  --   --  98  BILITOT  --   --  0.8  PROT  --   --  4.6*  ALBUMIN 2.5* 2.4* 2.2*   No results for input(s): LIPASE, AMYLASE in the last 168 hours. No results for input(s): AMMONIA in the last 168 hours. CBC: Recent Labs  Lab 04/11/17 0310 04/12/17 0904 04/13/17 0326 04/14/17 0313 04/15/17  0514  WBC 6.9 4.2 4.2 5.2 6.0  HGB 10.0* 9.9* 9.7* 9.7* 9.8*  HCT 31.1* 31.6* 30.7* 30.4* 31.5*  MCV 90.7 91.1 91.4 92.4 92.6  PLT 58* 49* 56* 44* 66*   Cardiac Enzymes: No results for input(s): CKTOTAL, CKMB, CKMBINDEX, TROPONINI in the last 168 hours. CBG: Recent Labs  Lab 04/14/17 1708 04/14/17 2222 04/15/17 0725 04/15/17 1035 04/15/17 1248  GLUCAP 146* 176* 130* 134* 150*    Iron Studies: No results for input(s): IRON, TIBC, TRANSFERRIN, FERRITIN in the last 72 hours. Studies/Results: No results found. Medications: Infusions: . sodium chloride    . sodium chloride 10 mL/hr at 04/15/17 1026  . heparin 1,500 Units/hr (04/15/17 0716)    Scheduled Medications: . amiodarone  200 mg Oral Daily  . atorvastatin  10 mg Oral q1800  . carvedilol  6.25 mg Oral BID WC  . darbepoetin (ARANESP) injection - DIALYSIS  60 mcg Intravenous Q Sat-HD  . escitalopram  10 mg Oral  Daily  . feeding supplement (NEPRO CARB STEADY)  237 mL Oral TID BM  . gabapentin  300 mg Oral QHS  . Gerhardt's butt cream   Topical TID  . insulin aspart  0-9 Units Subcutaneous TID WC  . insulin NPH Human  10 Units Subcutaneous QAC breakfast  . ipratropium-albuterol  3 mL Nebulization Q6H  . levothyroxine  88 mcg Oral QAC breakfast  . loperamide  2 mg Oral Daily  . multivitamin  1 tablet Oral QHS  . sodium chloride flush  3 mL Intravenous Q12H  . Warfarin - Pharmacist Dosing Inpatient   Does not apply q1800    have reviewed scheduled and prn medications.  Physical Exam: General: obese, still out of it from surgery- husband at bedside Heart: RRR Lungs: CBS bilat Abdomen: obese, soft Extremities: pitting edema Dialysis Access: right chest cath and right upper arm AVF- can feel thrill     04/15/2017,3:40 PM  LOS: 17 days

## 2017-04-15 NOTE — Anesthesia Procedure Notes (Signed)
Procedure Name: MAC Date/Time: 04/15/2017 11:15 AM Performed by: White, Amedeo Plenty, CRNA Pre-anesthesia Checklist: Patient identified, Emergency Drugs available, Suction available, Patient being monitored and Timeout performed Patient Re-evaluated:Patient Re-evaluated prior to induction Oxygen Delivery Method: Simple face mask

## 2017-04-15 NOTE — Progress Notes (Signed)
ANTICOAGULATION CONSULT NOTE - Follow Up Consult  Pharmacy Consult for Coumadin and Heparin bridge Indication: atrial fibrillation  Allergies  Allergen Reactions  . Sulfonamide Derivatives Other (See Comments)    UNSPECIFIED REACTION OF CHILDHOOD [per Mother]    Patient Measurements: Height: 5\' 4"  (162.6 cm) Weight: 220 lb 0.3 oz (99.8 kg) IBW/kg (Calculated) : 54.7 Heparin Dosing Weight: 80 kg  Vital Signs: Temp: 97.5 F (36.4 C) (12/03 1501) Temp Source: Axillary (12/03 1501) BP: 116/55 (12/03 1651) Pulse Rate: 72 (12/03 1501)  Labs: Recent Labs    04/13/17 0326  04/14/17 0313 04/14/17 1350 04/15/17 0006 04/15/17 0514 04/15/17 0823  HGB 9.7*  --  9.7*  --   --  9.8*  --   HCT 30.7*  --  30.4*  --   --  31.5*  --   PLT 56*  --  44*  --   --  66*  --   LABPROT 16.4*  --  16.9*  --   --  18.5*  --   INR 1.33  --  1.38  --   --  1.56  --   HEPARINUNFRC  --    < > 0.22* 0.28* 0.29*  --  0.43  CREATININE 3.15*  --   --   --   --   --   --    < > = values in this interval not displayed.    Estimated Creatinine Clearance: 18 mL/min (A) (by C-G formula based on SCr of 3.15 mg/dL (H)).  Assessment:   76 yr old female on Coumadin prior to admission for atrial fibrillation and history of mural thrombus.   INR therapeutic (2.71) on admit 11/16; INR up to 3.53 on 11/19 and Coumadin held. Vitamin K 5 mg PO given on 11/23 when INR down to 2.68.  IJ line placed 11/23 for dialysis access. Coumadin had continued on hold, anticipating AVF placement 11/30, but was resumed on 11/29 when procedure was postponed and IV heparin begun conservatively on 11/30.  Hx thrombocytopenia. Platelet count 94 on admit, trended down to 49 on 11/30, up to 66K today. No bleeding noted.  Discussed with Dr. Nevada Crane on 11/30; plan IV heparin as long as platelet count remains >20, or if any s/sx bleeding.       Now s/p AV fistula today. Okay to resume IV heparin and Coumadin tonight per Dr. Donnetta Hutching.       Last  heparin level was therapeutic (0.43) ~8:30am on 1500 units/hr, just before going for procedure.  Drip held for procedure, 6000 units given in OR.     INR 1.56 after Coumadin 5 mg x 3 days, then held on 12/2 for procedure today.     Coumadin regimen PTA:  2.5 mg daily except 5 mg on Mondays.  Goal of Therapy:  INR 2-3 Heparin level 0.3-0.5 units/ml (lower end of therapeutic range) Monitor platelets by anticoagulation protocol: Yes   Plan:   Resume heparin drip at 1600 units/hr.  Heparin level ~8hrs after drip resumes  Continue to target low therapeutic heparin levels.  Coumadin 5 mg x 1 tonight.  Daily heparin level, PT/INR and CBC.  Monitor for an s/sx bleeding.  Arty Baumgartner, [place] Pager: [phone] 04/15/2017,6:35 PM

## 2017-04-15 NOTE — Anesthesia Preprocedure Evaluation (Signed)
Anesthesia Evaluation  Patient identified by MRN, date of birth, ID band Patient awake    Reviewed: Allergy & Precautions, H&P , Patient's Chart, lab work & pertinent test results  Airway Mallampati: II  TM Distance: >3 FB Neck ROM: full    Dental no notable dental hx.    Pulmonary    Pulmonary exam normal breath sounds clear to auscultation       Cardiovascular Exercise Tolerance: Good hypertension,  Rhythm:regular Rate:Normal     Neuro/Psych    GI/Hepatic   Endo/Other  diabetes  Renal/GU      Musculoskeletal   Abdominal   Peds  Hematology   Anesthesia Other Findings Low plts Hx of CHF Hx of resp failure   LVEF  40% to 25-30%.    moderate aortic stenosis range, however AVA consistent with low flow severe aortic stenosis.   Right ventricle is severely dilated and systolic function at   least moderately decreased.  Moderate pulmonary hypertension.  Reproductive/Obstetrics                             Anesthesia Physical Anesthesia Plan  ASA: III  Anesthesia Plan: MAC   Post-op Pain Management:    Induction: Intravenous  PONV Risk Score and Plan:   Airway Management Planned: Mask and Natural Airway  Additional Equipment:   Intra-op Plan:   Post-operative Plan:   Informed Consent: I have reviewed the patients History and Physical, chart, labs and discussed the procedure including the risks, benefits and alternatives for the proposed anesthesia with the patient or authorized representative who has indicated his/her understanding and acceptance.     Plan Discussed with:   Anesthesia Plan Comments:         Anesthesia Quick Evaluation

## 2017-04-15 NOTE — Telephone Encounter (Signed)
-----   Message from Mena Goes, RN sent at 04/15/2017 12:56 PM EST ----- Regarding: 4-6 weeks w/ duplex   ----- Message ----- From: Iline Oven Sent: 04/15/2017  12:36 PM To: [facility] Charge Pool  Can you schedule an appt with Dr. Donnetta Hutching with a fistula duplex in about 4-6 weeks.  PO R arm brachiocephalic fistula creation. Thanks, [facility]

## 2017-04-15 NOTE — Progress Notes (Signed)
PT Cancellation Note  Patient Details Name: MICAL BRUN MRN: [id number] DOB: [birth year]   Cancelled Treatment:    Reason Eval/Treat Not Completed: Patient at procedure or test/unavailable. Pt in OR for AVF placement.   Lorriane Shire 04/15/2017, 10:25 AM

## 2017-04-15 NOTE — Progress Notes (Signed)
OT Cancellation Note  Patient Details Name: Destiny [name] MRN: [id number] DOB: 08/27/1940   Cancelled Treatment:    Reason Eval/Treat Not Completed: Patient at procedure or test/ unavailable; will follow up as schedule permits.   Lou Cal, OT Pager [phone] 04/15/2017   [name] 04/15/2017, 11:48 AM

## 2017-04-16 ENCOUNTER — Encounter (HOSPITAL_COMMUNITY): Payer: Self-pay | Admitting: Vascular Surgery

## 2017-04-16 ENCOUNTER — Ambulatory Visit: Payer: Medicare Other

## 2017-04-16 LAB — CBC
HCT: 33.1 % — ABNORMAL LOW (ref 36.0–46.0)
Hemoglobin: 10 g/dL — ABNORMAL LOW (ref 12.0–15.0)
MCH: 28.4 pg (ref 26.0–34.0)
MCHC: 30.2 g/dL (ref 30.0–36.0)
MCV: 94 fL (ref 78.0–100.0)
PLATELETS: 68 10*3/uL — AB (ref 150–400)
RBC: 3.52 MIL/uL — AB (ref 3.87–5.11)
RDW: 17.6 % — ABNORMAL HIGH (ref 11.5–15.5)
WBC: 6.9 10*3/uL (ref 4.0–10.5)

## 2017-04-16 LAB — GLUCOSE, CAPILLARY
GLUCOSE-CAPILLARY: 138 mg/dL — AB (ref 65–99)
Glucose-Capillary: 148 mg/dL — ABNORMAL HIGH (ref 65–99)
Glucose-Capillary: 117 mg/dL — ABNORMAL HIGH (ref 65–99)
Glucose-Capillary: 122 mg/dL — ABNORMAL HIGH (ref 65–99)

## 2017-04-16 LAB — HEPARIN LEVEL (UNFRACTIONATED)
HEPARIN UNFRACTIONATED: 0.28 [IU]/mL — AB (ref 0.30–0.70)
Heparin Unfractionated: 0.62 IU/mL (ref 0.30–0.70)

## 2017-04-16 LAB — PROTIME-INR
INR: 1.66
Prothrombin Time: 19.4 seconds — ABNORMAL HIGH (ref 11.4–15.2)

## 2017-04-16 MED ORDER — WARFARIN SODIUM 2.5 MG PO TABS
2.5000 mg | ORAL_TABLET | Freq: Once | ORAL | Status: AC
  Administered 2017-04-16: 2.5 mg via ORAL
  Filled 2017-04-16: qty 1

## 2017-04-16 NOTE — Progress Notes (Signed)
Subjective:  No change- is 2+ assist with everything Objective Vital signs in last 24 hours: Vitals:   04/15/17 2215 04/16/17 0147 04/16/17 0453 04/16/17 0859  BP: (!) 126/53  (!) 118/44 (!) 118/53  Pulse: 70  70   Resp: 17  11   Temp:   97.8 F (36.6 C)   TempSrc:   Axillary   SpO2: 95% 95% 96%   Weight:   99.5 kg (219 lb 5.7 oz)   Height:       Weight change: -0.3 kg (-10.6 oz)  Intake/Output Summary (Last 24 hours) at 04/16/2017 1200 Last data filed at 04/16/2017 0836 Gross per 24 hour  Intake 1025.66 ml  Output 5 ml  Net 1020.66 ml    Background: 76 y.o.year-old w/complicated PMH DM, MO, h/o NHL, chronic systolic/diastolic HF with Right HF, PAF on anticoagulation, OHS/OSA on NIMV, pacer/ICD, CAD prior CABG, HLD,  Known advanced CKD Stage 5  followed by Dr. Marval Regal PTA. Multiple prior AKI on CKD usually cardiorenal. Admitted 03/30/17 w/ progressive SOB, bilateral pleural effusions on CXR + R PNA. R thoracentesis of 900 ml on 11/19. Failed diuretics. HD initiated   1. New ESRDw/volume overload and progressive dyspnea(weight down 22 pounds) 1. S/P AVF 12/3 2. [facility]; last HD Sat- will plan for today here, TRYING to transition to OP unit at some point- I have doubts as to whether pt can be strong enough to withstand continued HD- for OP she will need to be able to sit up for about 6 hours and she has not done anything near to that.  Palliative care met with them, they want to continue everything- not sure is realistic- her primary nephrologist as an OP Dr. Loletha Grayer will talk to them later today  3. Supporting BP with midodrine pre and mid  2. Systolic (EF 35-70) and diastolic HF with ECFV overload,post thoracentesis improved aeration-volume improved.  On midodrine as well as coreg low dose - still overloaded - will try to support HD with albumin here today  4. Thrombocytopenia plts chronically low- 68 today 5. 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 Acute on  chronic hypoxic resp failure. Multifactorial & severe  8. Anemia- 2/2 CKD. s/p Aranesp 60 every Saturday  and recent dose Feraheme.       Dellanira Dillow A    Labs: Basic Metabolic Panel: Recent Labs  Lab 04/10/17 0456 04/11/17 0310 04/12/17 0736 04/13/17 0326  NA 133* 133* 131* 132*  K 4.1 3.6 4.8 4.1  CL 99* 99* 100* 98*  CO2 25 26 21* 25  GLUCOSE 110* 94 112* 161*  BUN 48* 26* 34* 36*  CREATININE 2.76* 2.04* 2.70* 3.15*  CALCIUM 8.2* 7.9* 8.1* 8.1*  PHOS 3.7  --   --   --    Liver Function Tests: Recent Labs  Lab 04/10/17 0456 04/13/17 0326  AST  --  33  ALT  --  13*  ALKPHOS  --  98  BILITOT  --  0.8  PROT  --  4.6*  ALBUMIN 2.4* 2.2*   No results for input(s): LIPASE, AMYLASE in the last 168 hours. No results for input(s): AMMONIA in the last 168 hours. CBC: Recent Labs  Lab 04/12/17 0904 04/13/17 0326 04/14/17 0313 04/15/17 0514 04/16/17 0138  WBC 4.2 4.2 5.2 6.0 6.9  HGB 9.9* 9.7* 9.7* 9.8* 10.0*  HCT 31.6* 30.7* 30.4* 31.5* 33.1*  MCV 91.1 91.4 92.4 92.6 94.0  PLT 49* 56* 44* 66* 68*  Cardiac Enzymes: No results for input(s): CKTOTAL, CKMB, CKMBINDEX, TROPONINI in the last 168 hours. CBG: Recent Labs  Lab 04/15/17 1248 04/15/17 1613 04/15/17 2140 04/16/17 0749 04/16/17 1123  GLUCAP 150* 145* 152* 138* 148*    Iron Studies: No results for input(s): IRON, TIBC, TRANSFERRIN, FERRITIN in the last 72 hours. Studies/Results: No results found. Medications: Infusions: . sodium chloride    . sodium chloride 10 mL/hr at 04/16/17 0650  . heparin 1,500 Units/hr (04/16/17 0911)    Scheduled Medications: . amiodarone  200 mg Oral Daily  . atorvastatin  10 mg Oral q1800  . carvedilol  6.25 mg Oral BID WC  . darbepoetin (ARANESP) injection - DIALYSIS  60 mcg Intravenous Q Sat-HD  . escitalopram  10 mg Oral Daily  . feeding supplement (NEPRO CARB STEADY)  237 mL Oral TID BM  . gabapentin  300 mg Oral QHS  . Gerhardt's butt cream    Topical TID  . insulin aspart  0-9 Units Subcutaneous TID WC  . insulin NPH Human  10 Units Subcutaneous QAC breakfast  . ipratropium-albuterol  3 mL Nebulization Q6H  . levothyroxine  88 mcg Oral QAC breakfast  . loperamide  2 mg Oral Daily  . multivitamin  1 tablet Oral QHS  . sodium chloride flush  3 mL Intravenous Q12H  . Warfarin - Pharmacist Dosing Inpatient   Does not apply q1800    have reviewed scheduled and prn medications.  Physical Exam: General: obese, somnolent - barely arousable Heart: RRR Lungs: CBS bilat Abdomen: obese, soft Extremities: pitting edema Dialysis Access: right chest cath and right upper arm AVF- can feel thrill     04/16/2017,12:00 PM  LOS: 18 days

## 2017-04-16 NOTE — Progress Notes (Signed)
ANTICOAGULATION CONSULT NOTE - Follow Up Consult  Pharmacy Consult for Coumadin and Heparin bridge Indication: atrial fibrillation  Allergies  Allergen Reactions  . Sulfonamide Derivatives Other (See Comments)    UNSPECIFIED REACTION OF CHILDHOOD [per Mother]    Patient Measurements: Height: 5\' 4"  (162.6 cm) Weight: 219 lb 5.7 oz (99.5 kg) IBW/kg (Calculated) : 54.7 Heparin Dosing Weight: 80 kg  Vital Signs: Temp: 98 F (36.7 C) (12/04 1312) Temp Source: Axillary (12/04 1312) BP: 108/41 (12/04 1312) Pulse Rate: 70 (12/04 1312)  Labs: Recent Labs    04/14/17 0313  04/15/17 0006 04/15/17 0514 04/15/17 0823 04/16/17 0138  HGB 9.7*  --   --  9.8*  --  10.0*  HCT 30.4*  --   --  31.5*  --  33.1*  PLT 44*  --   --  66*  --  68*  LABPROT 16.9*  --   --  18.5*  --  19.4*  INR 1.38  --   --  1.56  --  1.66  HEPARINUNFRC 0.22*   < > 0.29*  --  0.43 0.28*   < > = values in this interval not displayed.    Estimated Creatinine Clearance: 18 mL/min (A) (by C-G formula based on SCr of 3.15 mg/dL (H)).  Assessment: 76 yr old female on Coumadin prior to admission for atrial fibrillation and history of mural thrombus. INR was therapeutic on admit then increased. Surgical procedure was needed so on 11/23 patient received Vit K 5mg  po x1. Coumadin was held 11/20 - 11/28, then resumed 11/29 to 12/1. Coumadin was held again 12/2 for AV fistula placement 12/3.   Pharmacy was consulted to resume Coumadin and Heparin bridge on 12/3 PM however patient was too sleepy to receive dose and dose was delayed being given until 12/4 at [facility].   Heparin level at 10 AM has been delayed to be drawn just prior to hemodialysis.  INR today was 1.66 - sub-therapeutic for goal.   Coumadin regimen PTA:  2.5 mg daily except 5 mg on Mondays.  Goal of Therapy:  INR 2-3 Heparin level 0.3-0.5 units/ml (lower end of therapeutic range) Monitor platelets by anticoagulation protocol: Yes   Plan:   Continue  heparin drip at 1500 units/hr.  Follow-up HL just prior to HD  Continue to target low therapeutic heparin levels.  Coumadin 2.5 mg x 1 tonight (lower dose due to recent 5mg  dose at 5AM).  Daily heparin level, PT/INR and CBC.  Monitor for an s/sx bleeding.  Sloan Leiter, PharmD, BCPS, BCCCP Clinical Pharmacist Clinical phone 04/16/2017 until 4PM [phone] After hours, please call #[phone] 04/16/2017,3:48 PM

## 2017-04-16 NOTE — Progress Notes (Signed)
[name], female   DOB: 03-04-1941, 76 y.o.   MRN: [id number] Comfortable this morning.  Husband is room with patient.  Antecubital incision healing in nice thrill above the antecubital incision.  I will see her back in our office in 1 month for further follow-up.  We will not follow actively as an inpatient.  Please call if we can assist.

## 2017-04-16 NOTE — Progress Notes (Signed)
Physical Therapy Treatment Patient Details Name: Destiny [name] MRN: [id number] DOB: 1940/08/27 Today's Date: 04/16/2017    History of Present Illness Pt is a 76 y.o. female who presented to the ED with 2 day history of worsening shortness of breath. Found to have B pleural effusions and volume overload. Attempted diuresis but ultimately required HD, new dx ESRD. AV graft placed 12/3.  PMH significant for CHF with EF 20% with AICD, COPD on home oxygen of 2L, CKD stage IV, obesity, HTN, anemia, pressure injury, and diabetes.     PT Comments    Pt with limited progress d/t lethargy, did arouse with multi-modal stim and movement although difficulty maintaining incr level of arousal; pt to have HD today and is on T/TH/S schedule now so will continue efforts to see on MWF or as schedule allows  VS:  HR 70s, SpO2 90s, RR 16-21   Follow Up Recommendations  SNF;Supervision/Assistance - 24 hour     Equipment Recommendations  Other (comment)(defer to SNF)    Recommendations for Other Services       Precautions / Restrictions Precautions Precautions: Fall Restrictions Weight Bearing Restrictions: No    Mobility  Bed Mobility Overal bed mobility: Needs Assistance Bed Mobility: Rolling;Sidelying to Sit;Sit to Sidelying Rolling: +2 for safety/equipment;Total assist;+2 for physical assistance Sidelying to sit: +2 for physical assistance;+2 for safety/equipment;Total assist Supine to sit: +2 for physical assistance;Total assist Sit to supine: +2 for physical assistance;+2 for safety/equipment;Total assist   General bed mobility comments: multi-modal cues to incr level of arousal;  bed pad used to  assist for all aspects, pt unable to self assist d/t lethargy and global weakness  Transfers Overall transfer level: Needs assistance Equipment used: Rolling walker (2 wheeled) Transfers: Sit to/from Stand Sit to Stand: +2 physical assistance;Max assist;+2 safety/equipment         General  transfer comment: attmepted, pt with some WBing LEs but unable to maintain, unable to extend trunk  or use UEs tol assist self  Ambulation/Gait             General Gait Details: unable this session   Stairs            Wheelchair Mobility    Modified Rankin (Stroke Patients Only)       Balance Overall balance assessment: Needs assistance   Sitting balance-Leahy Scale: Poor Sitting balance - Comments: multi-directional LOB EOB today, requiring multi-modal cues to maintain midline control as well as physical assist Postural control: Left lateral lean   Standing balance-Leahy Scale: Zero Standing balance comment: pt unable to stand upright                            Cognition Arousal/Alertness: Lethargic Behavior During Therapy: Flat affect Overall Cognitive Status: Impaired/Different from baseline Area of Impairment: Orientation;Memory;Attention;Following commands;Problem solving                 Orientation Level: Disoriented to;Time;Situation Current Attention Level: Focused Memory: Decreased short-term memory Following Commands: Follows one step commands inconsistently(unable to follow commands )     Problem Solving: Slow processing;Decreased initiation;Difficulty sequencing;Requires verbal cues;Requires tactile cues General Comments: husband reports pt is not at her baseline in cognition      Exercises      General Comments General comments (skin integrity, edema, etc.): heels floated d/t reddened areas      Pertinent Vitals/Pain Pain Assessment: Faces Faces Pain Scale: No hurt    Home Living  Prior Function            PT Goals (current goals can now be found in the care plan section) Acute Rehab PT Goals Patient Stated Goal: to get better and go home PT Goal Formulation: With patient Time For Goal Achievement: 04/21/17 Potential to Achieve Goals: Fair Progress towards PT goals: Not progressing  toward goals - comment(too lethargic)    Frequency    Min 3X/week      PT Plan Current plan remains appropriate    Co-evaluation PT/OT/SLP Co-Evaluation/Treatment: Yes Reason for Co-Treatment: For patient/therapist safety PT goals addressed during session: Mobility/safety with mobility OT goals addressed during session: ADL's and self-care;Strengthening/ROM      AM-PAC PT "6 Clicks" Daily Activity  Outcome Measure  Difficulty turning over in bed (including adjusting bedclothes, sheets and blankets)?: Unable Difficulty moving from lying on back to sitting on the side of the bed? : Unable Difficulty sitting down on and standing up from a chair with arms (e.g., wheelchair, bedside commode, etc,.)?: Unable Help needed moving to and from a bed to chair (including a wheelchair)?: Total Help needed walking in hospital room?: Total Help needed climbing 3-5 steps with a railing? : Total 6 Click Score: 6    End of Session Equipment Utilized During Treatment: Oxygen;Gait belt Activity Tolerance: Patient limited by lethargy;Patient limited by fatigue Patient left: with call bell/phone within reach;with family/visitor present;with bed alarm set   PT Visit Diagnosis: Unsteadiness on feet (R26.81);Muscle weakness (generalized) (M62.81);Difficulty in walking, not elsewhere classified (R26.2)     Time: 1030-1056 PT Time Calculation (min) (ACUTE ONLY): 26 min  Charges:  $Therapeutic Activity: 8-22 mins                    G Codes:          Mickeal Daws 05/16/2017, 11:25 AM

## 2017-04-16 NOTE — Plan of Care (Signed)
Pt endorses dyspnea at rest. Dependent of ADLs

## 2017-04-16 NOTE — Progress Notes (Signed)
PROGRESS NOTE    Destiny [name]  AOZ:[id number] DOB: [birth year] DOA: 03/29/2017 PCP: Mosie Lukes, MD    Brief Narrative:  76 yo F CHF EF 20% with AICD, COPD on home oxygen of 2 L, CKD stage IV, anticoagulation with Coumadin with prior LV mural thrombus, and DM who presents with few days dyspnea.   Treated with 4 days ceftriaxone and azithromycin, no improvement.  Attempted diuresis but very little urine output, so started dialysis and tolerating well.  Fistula was placed by vascular surgery.  Patient back on coumadin and being bridged with pharmacy management. Patient is very deconditioned and get dyspneic even with minimal exertion as with sitting up in bed. Palliative care consulted.  Subjective Patient noted to be very lethargic.  Arousable but goes right back to sleep. Husband is at the bedside.  Apparently patient received gabapentin earlier this morning.  Assessment & Plan:  Principal Problem:   Acute on chronic combined systolic and diastolic CHF (congestive heart failure) (HCC) Active Problems:   Diabetes mellitus type 2 with retinopathy ([place])   Hyperlipidemia   Essential hypertension   Automatic implantable cardioverter-defibrillator in situ   Hypothyroidism   Anemia   Morbid obesity (HCC)   COPD, severe (HCC)   CKD (chronic kidney disease) stage 4, GFR 15-29 ml/min ([place])   Community acquired pneumonia   Acute hypoxemic respiratory failure (HCC)   Pressure injury of skin   Acute on chronic hypoxic respiratory failure most likely 2/2 to chronic systolic congestive heart failure EF 20%, pleural effusion, fluid overload -Failed diuretics, elected to begin HD in consultation with Nephrology.  -respiratory status improved after dialysis -Temp cath in [facility]J present -HD per Nephrology -right thoracentesis with 900 cc body fluid removed 04/02/17. Pleural fluid with elevated LDH, low protein.  Meets Light's criteria, but suspect this is not parapneumonic. -continue O2  supplementation to maintain O2 saturation 92% or greater -Bipap at night  ESRD now on HD -HD per Nephrology -MBD w/u, CLIP, Aranesp per Nephrology -R AV fistula 04/15/17 -Seen by palliative medicine as well.  Lethargy Possibly due to medications.  She was given gabapentin later than usual time.  Continue to monitor closely.  Avoid sedative medications.  Vital signs are stable.  Atrial fibrillation CHADS2Vasc 4 at least, but no reported stroke, LV thrombus years ago. -warfarin, amiodarone. -resumed warfarin, to bridge with heparin drip -Pharmacy management of heparin drip -Continue amiodarone, Coreg -INR is slowly climbing.  Chronic thrombocytopenia -stable -no sign of gross bleeding -In setting of NHL in remission, followed by Dr. Benay Spice.   -No cirrhosis on last CT imaging.   Abdominal distention 2/2 to moderate ascites, suspected hepatic steatosis vs chronic liver disease -abdominal ultrasound 04/12/17: 1. Moderate intra-abdominal ascites. 2. Increased hepatic echogenicity may reflect hepatic steatosis or other chronic liver disease. 3. Splenomegaly. 4. Bilateral renal atrophy with parenchymal thinning consistent with chronic medical renal disease.  -hepatitis panel, hep B HIV negative (03/2017) LFTs have been normal.  Normocytic anemia -most likely 2/2 to anemia of chronic disease with ESRD -hemoglobin has been stable.  Acute on chronic systolic CHF -Volume management with hemodialysis. -Continue aspirin, statin, BB, HD  Hyponatremia -Stable -fluid restriction -renal diet with 1200 cc fluid restriction  Deconditioning/ambulatory dysfunction -Seen by physical and occupational therapy.  Skilled nursing facility is recommended. -OOB to chair with every shift -fall precaution  Suspected Community Acquired pneumonia, present on admission Completed course of antibiotics.    Chronic COPD -Continue bronchodilators PRN  Diabetes -Continu NPH -Continue  SSI -Monitor CBGs  Hypothyroidism -Continue levothyroxine  Other medications -Continue Bentyl -Continue Lexapro -Continue gabapentin, dose decreased now on HD  Elevated troponin Likely due to demand ischemia Peaked at 0.04 (04/05/17)  DVT prophylaxis: heparin drip bridging coumadin Code Status: FULL Family Communication: Discussed with husband at bedside Disposition Plan: Await therapeutic INR.  Will need placement to skilled nursing facility.   Consultants:   Nephrology, EP, IR, palliative medicine  Procedures:   Thoracentesis  HD  Right AV fistula placement 04/15/17  Antimicrobials:   Completed course of antibiotics.   Objective: Vitals:   04/16/17 0147 04/16/17 0453 04/16/17 0859 04/16/17 1312  BP:  (!) 118/44 (!) 118/53 (!) 108/41  Pulse:  70  70  Resp:  11  14  Temp:  97.8 F (36.6 C)  98 F (36.7 C)  TempSrc:  Axillary  Axillary  SpO2: 95% 96%  98%  Weight:  99.5 kg (219 lb 5.7 oz)    Height:        Intake/Output Summary (Last 24 hours) at 04/16/2017 1332 Last data filed at 04/16/2017 1315 Gross per 24 hour  Intake 1085.66 ml  Output -  Net 1085.66 ml   Filed Weights   04/14/17 0557 04/15/17 0459 04/16/17 0453  Weight: 96.4 kg (212 lb 8.4 oz) 99.8 kg (220 lb 0.3 oz) 99.5 kg (219 lb 5.7 oz)    Examination: General appearance: Awake alert.  No distress. lethargic Cardiac: S1-S2 normal regular.  No S3-S4.  No rubs murmurs or bruit  Respiratory: Poor effort.  Diminished air entry at the bases.  No crackles or wheezing. Abdomen: Abdomen is soft.  Nontender nondistended Neuro: Lethargic.  Arousable.  Moving all extremities.      Data Reviewed: I have personally reviewed following labs and imaging studies:  CBC: Recent Labs  Lab 04/12/17 0904 04/13/17 0326 04/14/17 0313 04/15/17 0514 04/16/17 0138  WBC 4.2 4.2 5.2 6.0 6.9  HGB 9.9* 9.7* 9.7* 9.8* 10.0*  HCT 31.6* 30.7* 30.4* 31.5* 33.1*  MCV 91.1 91.4 92.4 92.6 94.0  PLT 49* 56* 44*  66* 68*   Basic Metabolic Panel: Recent Labs  Lab 04/10/17 0456 04/11/17 0310 04/12/17 0736 04/13/17 0326  NA 133* 133* 131* 132*  K 4.1 3.6 4.8 4.1  CL 99* 99* 100* 98*  CO2 25 26 21* 25  GLUCOSE 110* 94 112* 161*  BUN 48* 26* 34* 36*  CREATININE 2.76* 2.04* 2.70* 3.15*  CALCIUM 8.2* 7.9* 8.1* 8.1*  PHOS 3.7  --   --   --    GFR: Estimated Creatinine Clearance: 18 mL/min (A) (by C-G formula based on SCr of 3.15 mg/dL (H)). Liver Function Tests: Recent Labs  Lab 04/10/17 0456 04/13/17 0326  AST  --  33  ALT  --  13*  ALKPHOS  --  98  BILITOT  --  0.8  PROT  --  4.6*  ALBUMIN 2.4* 2.2*    Coagulation Profile: Recent Labs  Lab 04/12/17 0736 04/13/17 0326 04/14/17 0313 04/15/17 0514 04/16/17 0138  INR 1.12 1.33 1.38 1.56 1.66   CBG: Recent Labs  Lab 04/15/17 1248 04/15/17 1613 04/15/17 2140 04/16/17 0749 04/16/17 1123  GLUCAP 150* 145* 152* 138* 148*      Scheduled Meds: . amiodarone  200 mg Oral Daily  . atorvastatin  10 mg Oral q1800  . carvedilol  6.25 mg Oral BID WC  . darbepoetin (ARANESP) injection - DIALYSIS  60 mcg Intravenous Q Sat-HD  . escitalopram  10 mg Oral  Daily  . feeding supplement (NEPRO CARB STEADY)  237 mL Oral TID BM  . gabapentin  300 mg Oral QHS  . Gerhardt's butt cream   Topical TID  . insulin aspart  0-9 Units Subcutaneous TID WC  . insulin NPH Human  10 Units Subcutaneous QAC breakfast  . ipratropium-albuterol  3 mL Nebulization Q6H  . levothyroxine  88 mcg Oral QAC breakfast  . loperamide  2 mg Oral Daily  . multivitamin  1 tablet Oral QHS  . sodium chloride flush  3 mL Intravenous Q12H  . Warfarin - Pharmacist Dosing Inpatient   Does not apply q1800   Continuous Infusions: . sodium chloride    . sodium chloride 10 mL/hr at 04/16/17 0650  . heparin 1,500 Units/hr (04/16/17 0911)     LOS: 18 days    Time spent: 25 minutes    Bonnielee Haff, MD [facility] Pager [phone]  If 7PM-7AM, please  contact night-coverage [email or web address] Password [email or web address] 04/16/2017, 11:34 AM

## 2017-04-16 NOTE — Progress Notes (Signed)
ANTICOAGULATION CONSULT NOTE - Follow Up Consult  Pharmacy Consult for Heparin bridge Indication: atrial fibrillation  Allergies  Allergen Reactions  . Sulfonamide Derivatives Other (See Comments)    UNSPECIFIED REACTION OF CHILDHOOD [per Mother]    Patient Measurements: Height: 5\' 4"  (162.6 cm) Weight: 219 lb 5.7 oz (99.5 kg) IBW/kg (Calculated) : 54.7 Heparin Dosing Weight: 80 kg  Vital Signs: Temp: 98 F (36.7 C) (12/04 1312) Temp Source: Axillary (12/04 1312) BP: 108/41 (12/04 1312) Pulse Rate: 70 (12/04 1312)  Labs: Recent Labs    04/14/17 0313  04/15/17 0514 04/15/17 0823 04/16/17 0138 04/16/17 1847  HGB 9.7*  --  9.8*  --  10.0*  --   HCT 30.4*  --  31.5*  --  33.1*  --   PLT 44*  --  66*  --  68*  --   LABPROT 16.9*  --  18.5*  --  19.4*  --   INR 1.38  --  1.56  --  1.66  --   HEPARINUNFRC 0.22*   < >  --  0.43 0.28* 0.62   < > = values in this interval not displayed.    Estimated Creatinine Clearance: 18 mL/min (A) (by C-G formula based on SCr of 3.15 mg/dL (H)).  Assessment: 76 yr old female on Coumadin prior to admission for atrial fibrillation and history of mural thrombus. Pharmacy managing heparin bridge.  Heparin level was to be drawn just prior to hemodialysis however she was not going to be dialyzed until 2a per HD unit. I did not want to delay the draw until then so rescheduled heparin level.  Heparin level is supratherapeutic at 0.62 on 1500 units/hr. No bleeding noted.   Goal of Therapy:  Heparin level 0.3-0.5 units/ml (lower end of therapeutic range) Monitor platelets by anticoagulation protocol: Yes   Plan:   Decrease heparin drip to 1400 units/hr  Heparin level to be drawn prior to HD which will be an early draw  Continue to target low therapeutic heparin levels  Daily heparin level, PT/INR and CBC  Monitor for an s/sx bleeding   Renold Genta, PharmD, [place] - [phone] 04/16/2017 8:20 PM

## 2017-04-16 NOTE — Anesthesia Postprocedure Evaluation (Signed)
Anesthesia Post Note  Patient: Destiny [name]  Procedure(s) Performed: ARTERIOVENOUS (AV) FISTULA CREATION RIGHT ARM (Right )     Patient location during evaluation: PACU Anesthesia Type: MAC Level of consciousness: awake and alert Pain management: pain level controlled Vital Signs Assessment: post-procedure vital signs reviewed and stable Respiratory status: spontaneous breathing, nonlabored ventilation, respiratory function stable and patient connected to nasal cannula oxygen Cardiovascular status: stable and blood pressure returned to baseline Postop Assessment: no apparent nausea or vomiting Anesthetic complications: no    Last Vitals:  Vitals:   04/16/17 0453 04/16/17 0859  BP: (!) 118/44 (!) 118/53  Pulse: 70   Resp: 11   Temp: 36.6 C   SpO2: 96%     Last Pain:  Vitals:   04/16/17 0954  TempSrc:   PainSc: 0-No pain                 [place] Wardlow EDWARD

## 2017-04-16 NOTE — Progress Notes (Signed)
ANTICOAGULATION CONSULT NOTE - Follow Up Consult  Pharmacy Consult for heparin Indication: atrial fibrillation  Labs: Recent Labs    04/14/17 0313  04/15/17 0006 04/15/17 0514 04/15/17 0823 04/16/17 0138  HGB 9.7*  --   --  9.8*  --  10.0*  HCT 30.4*  --   --  31.5*  --  33.1*  PLT 44*  --   --  66*  --  68*  LABPROT 16.9*  --   --  18.5*  --  19.4*  INR 1.38  --   --  1.56  --  1.66  HEPARINUNFRC 0.22*   < > 0.29*  --  0.43 0.28*   < > = values in this interval not displayed.    Assessment/Plan:  76yo female slightly subtherapeutic on heparin after resuming, likely needs more time to accumulate. Will continue gtt at current rate for now and check additional level.   Wynona Neat, PharmD, BCPS  04/16/2017,3:53 AM

## 2017-04-16 NOTE — Progress Notes (Signed)
Occupational Therapy Treatment Patient Details Name: Destiny [name] MRN: [id number] DOB: [birth year] Today's Date: 04/16/2017    History of present illness Pt is a 76 y.o. female who presented to the ED with 2 day history of worsening shortness of breath. Found to have B pleural effusions and volume overload. Attempted diuresis but ultimately required HD, new dx ESRD. AV graft placed 12/3.  PMH significant for CHF with EF 20% with AICD, COPD on home oxygen of 2L, CKD stage IV, obesity, HTN, anemia, pressure injury, and diabetes.    OT comments  Pt with fatigue and lethargy this visit, requiring cold wash cloth on face and sternal rub to awaken. Requiring +2 total assist for bed mobility and demonstrating poor sitting balance at EOB. Attempted to stand x 1, but pt unable to lift trunk. Continues to be appropriate for post acute rehab.  Follow Up Recommendations  SNF;Supervision/Assistance - 24 hour    Equipment Recommendations       Recommendations for Other Services      Precautions / Restrictions Precautions Precautions: Fall       Mobility Bed Mobility Overal bed mobility: Needs Assistance Bed Mobility: Rolling;Supine to Sit;Sit to Supine Rolling: Total assist;+2 for physical assistance   Supine to sit: +2 for physical assistance;Total assist Sit to supine: +2 for physical assistance;Total assist   General bed mobility comments: pt not offering any assistance due to lethargy, weakness  Transfers Overall transfer level: Needs assistance Equipment used: Rolling walker (2 wheeled) Transfers: Sit to/from Stand Sit to Stand: +2 physical assistance;Max assist         General transfer comment: attempted to stand, pt unable to lift trunk    Balance Overall balance assessment: Needs assistance   Sitting balance-Leahy Scale: Poor   Postural control: Left lateral lean   Standing balance-Leahy Scale: Zero Standing balance comment: pt unable to stand upright                           ADL either performed or assessed with clinical judgement   ADL Overall ADL's : Needs assistance/impaired     Grooming: Brushing hair;Sitting;Total assistance                                       Vision       Perception     Praxis      Cognition Arousal/Alertness: Lethargic Behavior During Therapy: Flat affect Overall Cognitive Status: Impaired/Different from baseline Area of Impairment: Orientation;Attention;Memory;Following commands;Problem solving                 Orientation Level: Disoriented to;Time;Situation Current Attention Level: Focused Memory: Decreased short-term memory Following Commands: (pt not following commands)     Problem Solving: Slow processing;Decreased initiation;Difficulty sequencing;Requires verbal cues;Requires tactile cues          Exercises     Shoulder Instructions       General Comments      Pertinent Vitals/ Pain       Pain Assessment: Faces Faces Pain Scale: No hurt  Home Living                                          Prior Functioning/Environment  Frequency  Min 2X/week        Progress Toward Goals  OT Goals(current goals can now be found in the care plan section)  Progress towards OT goals: Not progressing toward goals - comment(lethargic)  Acute Rehab OT Goals Patient Stated Goal: to get better and go home OT Goal Formulation: With patient Time For Goal Achievement: 04/21/17 Potential to Achieve Goals: Good  Plan Discharge plan remains appropriate    Co-evaluation    PT/OT/SLP Co-Evaluation/Treatment: Yes Reason for Co-Treatment: For patient/therapist safety   OT goals addressed during session: ADL's and self-care;Strengthening/ROM      AM-PAC PT "6 Clicks" Daily Activity     Outcome Measure   Help from another person eating meals?: Total Help from another person taking care of personal grooming?: Total Help from  another person toileting, which includes using toliet, bedpan, or urinal?: Total Help from another person bathing (including washing, rinsing, drying)?: Total Help from another person to put on and taking off regular upper body clothing?: Total Help from another person to put on and taking off regular lower body clothing?: Total 6 Click Score: 6    End of Session Equipment Utilized During Treatment: Gait belt;Oxygen  OT Visit Diagnosis: Other abnormalities of gait and mobility (R26.89);Muscle weakness (generalized) (M62.81)   Activity Tolerance Patient limited by fatigue;Patient limited by lethargy   Patient Left in bed;with call bell/phone within reach;with family/visitor present   Nurse Communication Mobility status        Time: 0981-1914 OT Time Calculation (min): 29 min  Charges: OT General Charges $OT Visit: 1 Visit OT Treatments $Therapeutic Activity: 8-22 mins  04/16/2017 Nestor Lewandowsky, OTR/L Pager: [phone] Werner Lean, [place] 04/16/2017, 11:17 AM

## 2017-04-16 NOTE — Progress Notes (Signed)
CSW continuing to follow for SNF placement. Patient has bed at [facility]. CSW to support with discharge when medically stable.  Estanislado Emms, [place]

## 2017-04-17 ENCOUNTER — Other Ambulatory Visit: Payer: Self-pay

## 2017-04-17 ENCOUNTER — Inpatient Hospital Stay (HOSPITAL_COMMUNITY): Payer: Medicare Other

## 2017-04-17 DIAGNOSIS — Z992 Dependence on renal dialysis: Principal | ICD-10-CM

## 2017-04-17 DIAGNOSIS — G9340 Encephalopathy, unspecified: Secondary | ICD-10-CM

## 2017-04-17 DIAGNOSIS — N186 End stage renal disease: Secondary | ICD-10-CM

## 2017-04-17 DIAGNOSIS — Z48812 Encounter for surgical aftercare following surgery on the circulatory system: Secondary | ICD-10-CM

## 2017-04-17 LAB — CBC
HEMATOCRIT: 31.7 % — AB (ref 36.0–46.0)
HEMOGLOBIN: 9.7 g/dL — AB (ref 12.0–15.0)
MCH: 28.5 pg (ref 26.0–34.0)
MCHC: 30.6 g/dL (ref 30.0–36.0)
MCV: 93.2 fL (ref 78.0–100.0)
Platelets: 91 10*3/uL — ABNORMAL LOW (ref 150–400)
RBC: 3.4 MIL/uL — ABNORMAL LOW (ref 3.87–5.11)
RDW: 17.9 % — ABNORMAL HIGH (ref 11.5–15.5)
WBC: 6.5 10*3/uL (ref 4.0–10.5)

## 2017-04-17 LAB — BASIC METABOLIC PANEL
ANION GAP: 11 (ref 5–15)
BUN: 41 mg/dL — ABNORMAL HIGH (ref 6–20)
CHLORIDE: 96 mmol/L — AB (ref 101–111)
CO2: 26 mmol/L (ref 22–32)
Calcium: 8.6 mg/dL — ABNORMAL LOW (ref 8.9–10.3)
Creatinine, Ser: 4.34 mg/dL — ABNORMAL HIGH (ref 0.44–1.00)
GFR calc Af Amer: 11 mL/min — ABNORMAL LOW (ref >60)
GFR, EST NON AFRICAN AMERICAN: 9 mL/min — AB (ref >60)
GLUCOSE: 105 mg/dL — AB (ref 65–99)
POTASSIUM: 4.1 mmol/L (ref 3.5–5.1)
Sodium: 133 mmol/L — ABNORMAL LOW (ref 135–145)

## 2017-04-17 LAB — HEPATIC FUNCTION PANEL
ALBUMIN: 2.5 g/dL — AB (ref 3.5–5.0)
ALK PHOS: 102 U/L (ref 38–126)
ALT: 7 U/L — ABNORMAL LOW (ref 14–54)
AST: 45 U/L — AB (ref 15–41)
Bilirubin, Direct: 0.5 mg/dL (ref 0.1–0.5)
Indirect Bilirubin: 0.8 mg/dL (ref 0.3–0.9)
TOTAL PROTEIN: 5.4 g/dL — AB (ref 6.5–8.1)
Total Bilirubin: 1.3 mg/dL — ABNORMAL HIGH (ref 0.3–1.2)

## 2017-04-17 LAB — T4, FREE: Free T4: 2.04 ng/dL — ABNORMAL HIGH (ref 0.61–1.12)

## 2017-04-17 LAB — GLUCOSE, CAPILLARY
GLUCOSE-CAPILLARY: 98 mg/dL (ref 65–99)
Glucose-Capillary: 90 mg/dL (ref 65–99)
Glucose-Capillary: 98 mg/dL (ref 65–99)
Glucose-Capillary: 109 mg/dL — ABNORMAL HIGH (ref 65–99)

## 2017-04-17 LAB — BLOOD GAS, ARTERIAL
ACID-BASE EXCESS: 0.6 mmol/L (ref 0.0–2.0)
Bicarbonate: 26.2 mmol/L (ref 20.0–28.0)
Drawn by: 52601
O2 CONTENT: 5 L/min
O2 SAT: 90.1 %
PCO2 ART: 53.7 mmHg — AB (ref 32.0–48.0)
PO2 ART: 61.3 mmHg — AB (ref 83.0–108.0)
Patient temperature: 98.6
pH, Arterial: 7.309 — ABNORMAL LOW (ref 7.350–7.450)

## 2017-04-17 LAB — VITAMIN B12: VITAMIN B 12: 1261 pg/mL — AB (ref 180–914)

## 2017-04-17 LAB — TSH: TSH: 0.807 u[IU]/mL (ref 0.350–4.500)

## 2017-04-17 LAB — PROTIME-INR
INR: 2.48
Prothrombin Time: 26.6 seconds — ABNORMAL HIGH (ref 11.4–15.2)

## 2017-04-17 LAB — AMMONIA: Ammonia: 73 umol/L — ABNORMAL HIGH (ref 9–35)

## 2017-04-17 MED ORDER — LACTULOSE 10 GM/15ML PO SOLN
10.0000 g | Freq: Three times a day (TID) | ORAL | Status: DC
  Administered 2017-04-17 (×2): 10 g via ORAL
  Filled 2017-04-17 (×2): qty 15

## 2017-04-17 MED ORDER — WARFARIN 1.25 MG HALF TABLET
1.2500 mg | ORAL_TABLET | Freq: Once | ORAL | Status: AC
  Administered 2017-04-17: 1.25 mg via ORAL
  Filled 2017-04-17: qty 1

## 2017-04-17 NOTE — Plan of Care (Signed)
Pt lethargic. Total assist. Remains dependent of all ADLs.

## 2017-04-17 NOTE — Progress Notes (Addendum)
PT Cancellation Note  Patient Details Name: Destiny [name] MRN: [id number] DOB: Jan 21, 1941   Cancelled Treatment:    Reason Eval/Treat Not Completed: Medical issues which prohibited therapy. Pt underwent HD overnight. More lethargic and difficult to arouse this AM. Pt currently on bipap. Spoke with RN who advised to hold PT and re-attempt at later date. She underwent CT this AM and currently has order for EEG.    Lorriane Shire 04/17/2017, 11:41 AM

## 2017-04-17 NOTE — Care Management Important Message (Signed)
Important Message  Patient Details  Name: Destiny [name] MRN: [id number] Date of Birth: 07-01-1940   Medicare Important Message Given:  Yes    [name] May 04/17/2017, 10:35 AM

## 2017-04-17 NOTE — Progress Notes (Signed)
. Subjective:  Just finished with HD this AM- had desat b/c bipap was taken off and she was still sleeping.  Removed 3 liters- did not have BP drop.  Very somnolent- few words spoken  Objective Vital signs in last 24 hours: Vitals:   04/17/17 0430 04/17/17 0500 04/17/17 0530 04/17/17 0600  BP: (!) 122/44 (!) 119/43 (!) 130/43 139/62  Pulse: 72 72 70 70  Resp: '12 12 14   '$ Temp:      TempSrc:      SpO2: 98% 99% 100%   Weight:      Height:       Weight change: -0.7 kg (-8.7 oz)  Intake/Output Summary (Last 24 hours) at 04/17/2017 0722 Last data filed at 04/16/2017 2208 Gross per 24 hour  Intake 1151.32 ml  Output -  Net 1151.32 ml    Background: 76 y.o.year-old w PMH DM, MO, h/o NHL, chronic bivent HF with  PAF on anticoagulation, OHS/OSA on NIMV, pacer/ICD, CAD prior CABG, HLD,  Known advanced CKD Stage 5  followed by Dr. Marval Regal PTA. Multiple prior AKI on CKD usually cardiorenal. Admitted 03/30/17 w/ progressive SOB, bilateral pleural effusions, Failed diuretics. HD initiated week of Thanksgiving  1. New ESRDw/volume overload and progressive dyspnea- HD initiated around 11/19-11/20  1. S/P AVF 12/3 2. [facility];  Have been doing onTTS here, TRYING to transition to OP unit at some point- I have doubts as to whether pt can be strong enough to withstand continued HD- for OP she will need to be able to sit up for about 6 hours and she has not done anything near to that.  Palliative care met with them, they want to continue everything- not sure is realistic- cont discussions 3. Supporting BP with midodrine pre and mid  2. Systolic (EF 07-37) and diastolic HF with ECFV overload,post thoracentesis improved aeration-volume improved.  On midodrine as well as coreg low dose - still overloaded - will try to support HD with albumin here  4. Thrombocytopenia plts chronically low- 91 today- no heparin with HD 5. 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 Acute on  chronic hypoxic resp failure. Multifactorial & severe  8. Anemia- 2/2 CKD. s/p Aranesp 60 every Saturday  and recent dose Feraheme. Hgb stable around [place]: Basic Metabolic Panel: Recent Labs  Lab 04/12/17 0736 04/13/17 0326 04/17/17 0302  NA 131* 132* 133*  K 4.8 4.1 4.1  CL 100* 98* 96*  CO2 21* 25 26  GLUCOSE 112* 161* 105*  BUN 34* 36* 41*  CREATININE 2.70* 3.15* 4.34*  CALCIUM 8.1* 8.1* 8.6*   Liver Function Tests: Recent Labs  Lab 04/13/17 0326  AST 33  ALT 13*  ALKPHOS 98  BILITOT 0.8  PROT 4.6*  ALBUMIN 2.2*   No results for input(s): LIPASE, AMYLASE in the last 168 hours. No results for input(s): AMMONIA in the last 168 hours. CBC: Recent Labs  Lab 04/13/17 0326 04/14/17 0313 04/15/17 0514 04/16/17 0138 04/17/17 0302  WBC 4.2 5.2 6.0 6.9 6.5  HGB 9.7* 9.7* 9.8* 10.0* 9.7*  HCT 30.7* 30.4* 31.5* 33.1* 31.7*  MCV 91.4 92.4 92.6 94.0 93.2  PLT 56* 44* 66* 68* 91*   Cardiac Enzymes: No results for input(s): CKTOTAL, CKMB, CKMBINDEX, TROPONINI in the last 168 hours. CBG: Recent Labs  Lab 04/15/17 2140 04/16/17 0749 04/16/17 1123 04/16/17 1608 04/16/17 2118  GLUCAP 152* 138* 148* 117* 122*  Iron Studies: No results for input(s): IRON, TIBC, TRANSFERRIN, FERRITIN in the last 72 hours. Studies/Results: No results found. Medications: Infusions: . sodium chloride    . sodium chloride 10 mL/hr at 04/16/17 2208  . heparin 1,400 Units/hr (04/17/17 0146)    Scheduled Medications: . amiodarone  200 mg Oral Daily  . atorvastatin  10 mg Oral q1800  . carvedilol  6.25 mg Oral BID WC  . darbepoetin (ARANESP) injection - DIALYSIS  60 mcg Intravenous Q Sat-HD  . escitalopram  10 mg Oral Daily  . feeding supplement (NEPRO CARB STEADY)  237 mL Oral TID BM  . gabapentin  300 mg Oral QHS  . Gerhardt's butt cream   Topical TID  . insulin aspart  0-9 Units Subcutaneous TID WC  . insulin NPH Human  10 Units  Subcutaneous QAC breakfast  . ipratropium-albuterol  3 mL Nebulization Q6H  . levothyroxine  88 mcg Oral QAC breakfast  . loperamide  2 mg Oral Daily  . multivitamin  1 tablet Oral QHS  . sodium chloride flush  3 mL Intravenous Q12H  . Warfarin - Pharmacist Dosing Inpatient   Does not apply q1800    have reviewed scheduled and prn medications.  Physical Exam: General: obese, somnolent - barely arousable Heart: RRR Lungs: CBS bilat Abdomen: obese, soft Extremities: pitting edema Dialysis Access: right chest cath and right upper arm AVF- can feel thrill     04/17/2017,7:22 AM  LOS: 19 days

## 2017-04-17 NOTE — Progress Notes (Signed)
RT transported pt to and from 6E28 to CT 2 on bipap. Pt stable throughout with no complications. VS within normal limits. Pt to remain on bipap for approximately 1 hr to help her breathing and then return to [place] when pt is ready. RT will continue to monitor.

## 2017-04-17 NOTE — Progress Notes (Signed)
EEG Completed; Results Pending  

## 2017-04-17 NOTE — Progress Notes (Signed)
Nutrition Follow-up  DOCUMENTATION CODES:   Obesity unspecified  INTERVENTION:    Continue to offer Nepro to TID as alertness and respiratory status allows, each supplement provides 425 kcal and 19 grams protein  NUTRITION DIAGNOSIS:   Inadequate oral intake related to poor appetite as evidenced by per patient/family report.  Ongoing  GOAL:   Patient will meet greater than or equal to 90% of their needs  Unmet  MONITOR:   PO intake, Supplement acceptance, Skin  ASSESSMENT:   76 yo female with PMH of carotid stenosis, LBBB, HLD, HTN, CHF, CAD, cardiomyopathy, retinopathy, DM-2, hypothyroidism, NHL, OSA, PAF, morbid obesity, pacemaker, vitamin D deficiency, CKD who was admitted on 11/16 with dyspnea, decreased UOP, CHF exacerbation.  PO intake remains poor. Patient is consuming 0-50% of meals due to lethargy and decreased alertness. She has been more somnolent today. She is being offered Nepro Shakes TID between meals. Palliative Care team following. Plans to continue aggressive support and HD s/p family meeting on 12/1. May not be realistic to continue HD per review of MD notes. Labs reviewed. Sodium 133 (L) Medications reviewed and include lactulose, rena-vit.  Diet Order:  Diet renal with fluid restriction Fluid restriction: 1200 mL Fluid; Room service appropriate? Yes; Fluid consistency: Thin  EDUCATION NEEDS:   Not appropriate for education at this time  Skin:  Skin Assessment: Skin Integrity Issues: Skin Integrity Issues:: Stage II Stage I: N/A Stage II: buttocks  Last BM:  11/29  Height:   Ht Readings from Last 1 Encounters:  03/29/17 5\' 4"  (1.626 m)    Weight:   Wt Readings from Last 1 Encounters:  04/17/17 217 lb 13 oz (98.8 kg)   04/05/17 242 lb 11.6 oz (110.1 kg)    Ideal Body Weight:  54.5 kg  BMI:  Body mass index is 37.39 kg/m.  Estimated Nutritional Needs:   Kcal:  2000-2200  Protein:  90-105 gm  Fluid:  1.2 L   Molli Barrows, RD, LDN, [place] Pager [phone] After Hours Pager [phone]

## 2017-04-17 NOTE — Progress Notes (Signed)
RT transported on BiPAP from Hem[facility] to 6E28. Placed on [place] when we returned. Called report to [place], RT

## 2017-04-17 NOTE — Progress Notes (Signed)
Pt somnolent, awakes to repeated voice and soft touch but falls back to sleep quickly, moaning but no understandable communication, lab reporting pt is very difficult to acquire blood sample, Dr Posey Pronto and Renal MD notified, order for labs from HD cath per IV team received, multiple orders orders for CT, Xray, ABGs, EEG, and labs acknowledged, on bipap at present.  Edward Qualia RN

## 2017-04-17 NOTE — Progress Notes (Signed)
[facility] Progress Note  Patient: Destiny [name] FKC:[id number]   PCP: Mosie Lukes, MD DOB: 12/05/1940   DOA: 03/29/2017   DOS: 04/17/2017   Date of Service: the patient was seen and examined on 04/17/2017  Subjective: still confused and lethargic.  Unable to follow commands.  Answers all questions with no.  Keeps on moaning.  RN thinks that the patient may have some back pain.  Brief hospital course: Pt. 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  Prolonged stay in the hospital.  Initially treated with IV antibiotics without any improvement.  Diuresis was attempted but urine output was not significant due to chronic kidney disease.  Nephrology consulted and the patient is started on HD as a new ESRD during the stay.  AV fistula placed.  Started developing progressive lethargy over last few days thought to be metabolic. Currently further plan is further workup for acute encephalopathy.  Assessment and Plan: 1.  Acute metabolic encephalopathy. Acute uncompensated primary respiratory acidosis History of COPD.  Multifactorial, ESRD with uremia, recent infection treated with antibiotics, medication use gabapentin, Lexapro, Xanax. Poor p.o. intake and malnutrition also playing a role. Has morbid obesity and OSA and in the past hypercarbia has caused her lethargy. Currently I will check CT head, ammonia, EEG for further workup. Monitor in stepdown unit. ABG shows acute and compensated hypercarbia.  On BiPAP.  Will monitor. With psychotropic medications.  2.  Acute on chronic hypoxic respiratory failure. Acute on chronic combined CHF. Morbid  obesity. ESRD. Ejection fraction 20%. Abdominal distention with fluid overload. Aggressive diuresis was attempted on admission but did not tolerated. No improvement in urine output with that as well. Nephrology consulted due to chronic kidney disease and started on hemodialysis as a new ESRD patient. Has a temp cath and AV fistula placed on 04/15/2017. Also underwent right thoracentesis, meeting light's criteria for exudative infusion although felt less likely parapneumonic. No growth on culture.  3.  Hypothyroidism. TSH have been elevated in the past, recheck TSH and free T4. Continue levothyroxine for now.  4.  Type 2 diabetes mellitus. Patient with poor p.o. intake due to mentation changes. Holding NPH. Continue sliding scale insulin.  5.  Community-acquired pneumonia. Blood cultures so far negative. Treated with ceftriaxone and azithromycin during the early stay in the hospital. Monitor.  6.  Moderate ascites. Hypoalbuminemia. Third spacing. Likely hepatic steatosis as well as poor p.o. intake. Ultrasound on 04/12/2017 no acute abnormality.  Chronic hepatic cirrhosis. Hepatitis panel, hep B, HIV negative. LFTs are normal as well. Should improve with hemodialysis with volume status improvement.  7.  Mood disorder. Holding oral psychotropic medications in the event of hypercarbia. Monitor.  8.  Essential hypertension. Continue Coreg.  9.  Constipation. Patient is on Lomotil as well as Imodium at home. Lomotil has been on hold, Imodium will also Check x-ray abdomen.  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 History of non-Hodgkin's lymphoma followed by Dr. Ammie Dalton.  Patient has chronically low platelets as well as anemia. No active bleeding here.  11.  Paroxysmal A. fib. On Coumadin, INR therapeutic stop heparin bridging. Also on amiodarone. Currently normal sinus rhythm.  Diet: Renal diet DVT Prophylaxis: on therapeutic anticoagulation.  Advance goals of care discussion:  Full code.  Palliative care consulted 04/13/2017 family wants to continue full treatment and aggressive care.  Family Communication: family was present at bedside, at the time of interview. The pt provided permission to discuss medical plan with the family. Opportunity was given to ask question and all questions were answered satisfactorily.   Disposition:  Discharge to be determined.   Consultants: vascular surgery, Palliative care, nephrology Procedures: HD, AV fistula placement  Antibiotics: Anti-infectives (From admission, onward)   Start     Dose/Rate Route Frequency Ordered Stop   04/15/17 1100  cefUROXime (ZINACEF) 1.5 g in dextrose 5 % 50 mL IVPB     1.5 g 100 mL/hr over 30 Minutes Intravenous  Once 04/15/17 1059 04/15/17 1900   04/15/17 1051  dextrose 5 % with cefUROXime (ZINACEF) ADS Med    Comments:  Scronce, Trina   : cabinet override      04/15/17 1051 04/15/17 1125   04/15/17 1029  ceFAZolin (ANCEF) 2-4 GM/100ML-% IVPB  Status:  Discontinued    Comments:  Scronce, Trina   : cabinet override      04/15/17 1029 04/15/17 1054   04/14/17 1245  ceFAZolin (ANCEF) IVPB 2g/100 mL premix     2 g 200 mL/hr over 30 Minutes Intravenous  Once 04/14/17 1234 04/14/17 1854   04/12/17 0800  ceFAZolin (ANCEF) IVPB 2g/100 mL premix     2 g 200 mL/hr over 30 Minutes Intravenous To Short Stay 04/09/17 1656 04/10/17 0900   04/12/17 0600  cefUROXime (ZINACEF) 1.5 g in dextrose 5 % 50 mL IVPB  Status:  Discontinued     1.5 g 100 mL/hr over 30 Minutes Intravenous To Short Stay 04/11/17 0834 04/13/17 0600   03/31/17 1400  azithromycin (ZITHROMAX) tablet 500 mg  Status:  Discontinued     500 mg Oral Daily 03/31/17 1232 04/02/17 1744   03/30/17 1630  azithromycin (ZITHROMAX) 500 mg in dextrose 5 % 250 mL IVPB  Status:  Discontinued     500 mg 250 mL/hr over 60 Minutes Intravenous Every 24 hours 03/29/17 1502 03/31/17 1231   03/30/17 1600  cefTRIAXone (ROCEPHIN) 1 g in dextrose 5 % 50 mL IVPB   Status:  Discontinued     1 g 100 mL/hr over 30 Minutes Intravenous Every 24 hours 03/29/17 1502 04/02/17 1744   03/29/17 1430  cefTRIAXone (ROCEPHIN) 1 g in dextrose 5 % 50 mL IVPB     1 g 100 mL/hr over 30 Minutes Intravenous  Once 03/29/17 1424 03/29/17 1647   03/29/17 1430  azithromycin (ZITHROMAX) 500 mg in dextrose 5 % 250 mL IVPB     500 mg 250 mL/hr over 60 Minutes Intravenous  Once 03/29/17 1424 03/29/17 1716       Objective: Physical Exam: Vitals:   04/17/17 0500 04/17/17 0530 04/17/17 0600 04/17/17 0736  BP: (!) 119/43 (!) 130/43 139/62   Pulse: 72 70 70 76  Resp: 12 14  18   Temp:      TempSrc:      SpO2: 99% 100%  95%  Weight:      Height:        Intake/Output Summary (Last 24 hours) at 04/17/2017 0948 Last data filed at 04/17/2017 0853 Gross per 24 hour  Intake 791.32 ml  Output -  Net 791.32 ml   Filed Weights   04/15/17 0459 04/16/17 0453 04/17/17 0238  Weight: 99.8 kg (220 lb 0.3 oz) 99.5 kg (219 lb 5.7 oz) 98.8  kg (217 lb 13 oz)   General: Alert, Awake and disoriented, not following commands. Appear in moderate distress, affect irritable Eyes: PERRL, Conjunctiva normal ENT: Oral Mucosa clear dry. Neck: difficult to assess JVD, no Abnormal Mass Or lumps Cardiovascular: S1 and S2 Present, no Murmur, Peripheral Pulses Present Respiratory: increased respiratory effort, Bilateral Air entry equal and Decreased, no use of accessory muscle, basal Crackles, no wheezes Abdomen: Bowel Sound present, Soft and mild tenderness, no hernia Skin: no redness, no Rash, no induration Extremities: bilateral Pedal edema, no calf tenderness Neurologic: Grossly no focal neuro deficit.  Spontaneously moving all extremities.  Reflexes difficult to elicit.  Not following any command.  Data Reviewed: CBC: Recent Labs  Lab 04/13/17 0326 04/14/17 0313 04/15/17 0514 04/16/17 0138 04/17/17 0302  WBC 4.2 5.2 6.0 6.9 6.5  HGB 9.7* 9.7* 9.8* 10.0* 9.7*  HCT 30.7* 30.4* 31.5*  33.1* 31.7*  MCV 91.4 92.4 92.6 94.0 93.2  PLT 56* 44* 66* 68* 91*   Basic Metabolic Panel: Recent Labs  Lab 04/11/17 0310 04/12/17 0736 04/13/17 0326 04/17/17 0302  NA 133* 131* 132* 133*  K 3.6 4.8 4.1 4.1  CL 99* 100* 98* 96*  CO2 26 21* 25 26  GLUCOSE 94 112* 161* 105*  BUN 26* 34* 36* 41*  CREATININE 2.04* 2.70* 3.15* 4.34*  CALCIUM 7.9* 8.1* 8.1* 8.6*    Liver Function Tests: Recent Labs  Lab 04/13/17 0326  AST 33  ALT 13*  ALKPHOS 98  BILITOT 0.8  PROT 4.6*  ALBUMIN 2.2*   No results for input(s): LIPASE, AMYLASE in the last 168 hours. No results for input(s): AMMONIA in the last 168 hours. Coagulation Profile: Recent Labs  Lab 04/13/17 0326 04/14/17 0313 04/15/17 0514 04/16/17 0138 04/17/17 0302  INR 1.33 1.38 1.56 1.66 2.48   Cardiac Enzymes: No results for input(s): CKTOTAL, CKMB, CKMBINDEX, TROPONINI in the last 168 hours. BNP (last 3 results) No results for input(s): PROBNP in the last 8760 hours. CBG: Recent Labs  Lab 04/16/17 0749 04/16/17 1123 04/16/17 1608 04/16/17 2118 04/17/17 0745  GLUCAP 138* 148* 117* 122* 90   Studies: No results found.  Scheduled Meds: . amiodarone  200 mg Oral Daily  . atorvastatin  10 mg Oral q1800  . carvedilol  6.25 mg Oral BID WC  . darbepoetin (ARANESP) injection - DIALYSIS  60 mcg Intravenous Q Sat-HD  . feeding supplement (NEPRO CARB STEADY)  237 mL Oral TID BM  . Gerhardt's butt cream   Topical TID  . insulin aspart  0-9 Units Subcutaneous TID WC  . ipratropium-albuterol  3 mL Nebulization Q6H  . levothyroxine  88 mcg Oral QAC breakfast  . multivitamin  1 tablet Oral QHS  . sodium chloride flush  3 mL Intravenous Q12H  . warfarin  1.25 mg Oral ONCE-1800  . Warfarin - Pharmacist Dosing Inpatient   Does not apply q1800   Continuous Infusions: . sodium chloride    . sodium chloride 10 mL/hr at 04/16/17 2208   PRN Meds: sodium chloride, acetaminophen, dicyclomine, diphenoxylate-atropine,  fluticasone, ipratropium-albuterol, midodrine, ondansetron (ZOFRAN) IV, sodium chloride, sodium chloride flush  Time spent: The patient is critically ill with multiple organ systems failure and requires high complexity decision making for assessment and support, frequent evaluation and titration of therapies. Critical Care Time devoted to patient care services described in this note is 40 minutes   Author: Berle Mull, MD [facility] Pager: [phone] 04/17/2017 9:48 AM  If 7PM-7AM, please contact night-coverage at [email or web address],  password [facility]

## 2017-04-17 NOTE — Progress Notes (Signed)
HD tx initiated via HD cath w/o problem, pull/push/flush equally w/o problem, VSS, will cont to monitor while on HD tx 

## 2017-04-17 NOTE — Procedures (Signed)
ELECTROENCEPHALOGRAM REPORT  Date of Study: 04/17/2017  Patient's Name: Destiny [name] MRN: [id number] Date of Birth: May 27, 1940  Referring Provider: Dr. Berle Mull  Clinical History: This is a 76 year old woman with acute encephalopathy  Medications: acetaminophen (TYLENOL) tablet 650 mg  amiodarone (PACERONE) tablet 200 mg  atorvastatin (LIPITOR) tablet 10 mg  carvedilol (COREG) tablet 6.25 mg  Darbepoetin Alfa (ARANESP) injection 60 mcg  dicyclomine (BENTYL) tablet 20 mg  diphenoxylate-atropine (LOMOTIL) 2.5-0.025 MG per tablet 1 tablet  fluticasone (FLONASE) 50 MCG/ACT nasal spray 2 spray  insulin aspart (novoLOG) injection 0-9 Units  lactulose (CHRONULAC) 10 GM/15ML solution 10 g  levothyroxine (SYNTHROID, LEVOTHROID) tablet 88 mcg  midodrine (PROAMATINE) tablet 10 mg  multivitamin (RENA-VIT) tablet 1 tablet  ondansetron (ZOFRAN) injection 4 mg  warfarin (COUMADIN) tablet 1.25 mg   Technical Summary: A multichannel digital EEG recording measured by the international 10-20 system with electrodes applied with paste and impedances below 5000 ohms performed as portable with EKG monitoring in an awake and confused patient.  Hyperventilation and photic stimulation were not performed.  The digital EEG was referentially recorded, reformatted, and digitally filtered in a variety of bipolar and referential montages for optimal display.   Description: The patient is awake and confused during the recording. There is no clear posterior dominant rhythm. The background consists of a large amount of diffuse 4-5 Hz theta and 2-3 Hz delta slowing with triphasic waves seen. Normal sleep architecture is not seen. Hyperventilation and photic stimulation were not performed. There were no epileptiform discharges or electrographic seizures seen.    EKG lead was unremarkable.  Impression: This awake EEG is abnormal due to moderate diffuse background slowing with triphasic waves.  Clinical  Correlation of the above findings indicates diffuse cerebral dysfunction that is non-specific in etiology and can be seen with hypoxic/ischemic injury, toxic/metabolic encephalopathies, neurodegenerative disorders, or medication effect. Triphasic waves are typically seen with hepatic encephalopathy but can be seen with other metabolic encephalopathies as well.  The absence of epileptiform discharges does not rule out a clinical diagnosis of epilepsy.  Clinical correlation is advised.   Ellouise Newer, M.D.

## 2017-04-17 NOTE — Progress Notes (Signed)
ANTICOAGULATION CONSULT NOTE - Follow Up Consult  Pharmacy Consult for Coumadin Indication: atrial fibrillation  Allergies  Allergen Reactions  . Sulfonamide Derivatives Other (See Comments)    UNSPECIFIED REACTION OF CHILDHOOD [per Mother]    Patient Measurements: Height: 5\' 4"  (162.6 cm) Weight: 217 lb 13 oz (98.8 kg) IBW/kg (Calculated) : 54.7 Heparin Dosing Weight: 80 kg  Vital Signs: Temp: 97.3 F (36.3 C) (12/05 0238) Temp Source: Axillary (12/05 0238) BP: 139/62 (12/05 0600) Pulse Rate: 76 (12/05 0736)  Labs: Recent Labs    04/15/17 0514 04/15/17 0823 04/16/17 0138 04/16/17 1847 04/17/17 0302  HGB 9.8*  --  10.0*  --  9.7*  HCT 31.5*  --  33.1*  --  31.7*  PLT 66*  --  68*  --  91*  LABPROT 18.5*  --  19.4*  --  26.6*  INR 1.56  --  1.66  --  2.48  HEPARINUNFRC  --  0.43 0.28* 0.62  --   CREATININE  --   --   --   --  4.34*    Assessment: 76 yr old female on Coumadin prior to admission for atrial fibrillation and history of mural thrombus. INR was therapeutic on admit then increased. Surgical procedure was needed so on 11/23 patient received Vit K 5mg  po x1. Coumadin was held 11/20 - 11/28, then resumed 11/29 to 12/1. Coumadin was held again 12/2 for AV fistula placement 12/3.   INR therapeutic this morning trending from 1.66 > 2.48. Hgb 9.7 and PLTc improved to 91.  Coumadin regimen PTA:  2.5 mg daily except 5 mg on Mondays.  Goal of Therapy:  INR 2-3 Monitor platelets by anticoagulation protocol: Yes   Plan:  1. Stop heparin infusion with therapeutic INR 2. Coumadin 1.25 mg x 1 this evening  3. Daily INR   Vincenza Hews, PharmD, BCPS 04/17/2017, 8:14 AM Clinical phone 04/18/2017 until 3:30 PM - [phone]

## 2017-04-17 NOTE — Progress Notes (Signed)
ABG drawn and sent to lab.  Right brachial, held 6 minutes.  5L .

## 2017-04-18 LAB — CBC WITH DIFFERENTIAL/PLATELET
Basophils Absolute: 0 10*3/uL (ref 0.0–0.1)
Basophils Relative: 0 %
EOS ABS: 0 10*3/uL (ref 0.0–0.7)
EOS PCT: 0 %
HCT: 30.8 % — ABNORMAL LOW (ref 36.0–46.0)
Hemoglobin: 9.5 g/dL — ABNORMAL LOW (ref 12.0–15.0)
LYMPHS ABS: 1.5 10*3/uL (ref 0.7–4.0)
LYMPHS PCT: 31 %
MCH: 29.1 pg (ref 26.0–34.0)
MCHC: 30.8 g/dL (ref 30.0–36.0)
MCV: 94.5 fL (ref 78.0–100.0)
MONO ABS: 0.7 10*3/uL (ref 0.1–1.0)
MONOS PCT: 14 %
NEUTROS PCT: 55 %
Neutro Abs: 2.8 10*3/uL (ref 1.7–7.7)
PLATELETS: 81 10*3/uL — AB (ref 150–400)
RBC: 3.26 MIL/uL — ABNORMAL LOW (ref 3.87–5.11)
RDW: 18.4 % — ABNORMAL HIGH (ref 11.5–15.5)
WBC: 5 10*3/uL (ref 4.0–10.5)

## 2017-04-18 LAB — COMPREHENSIVE METABOLIC PANEL
ALT: 7 U/L — ABNORMAL LOW (ref 14–54)
ANION GAP: 11 (ref 5–15)
AST: 37 U/L (ref 15–41)
Albumin: 2.3 g/dL — ABNORMAL LOW (ref 3.5–5.0)
Alkaline Phosphatase: 94 U/L (ref 38–126)
BUN: 22 mg/dL — ABNORMAL HIGH (ref 6–20)
CALCIUM: 8.1 mg/dL — AB (ref 8.9–10.3)
CHLORIDE: 96 mmol/L — AB (ref 101–111)
CO2: 24 mmol/L (ref 22–32)
Creatinine, Ser: 3.13 mg/dL — ABNORMAL HIGH (ref 0.44–1.00)
GFR, EST AFRICAN AMERICAN: 16 mL/min — AB (ref >60)
GFR, EST NON AFRICAN AMERICAN: 14 mL/min — AB (ref >60)
Glucose, Bld: 100 mg/dL — ABNORMAL HIGH (ref 65–99)
Potassium: 3.7 mmol/L (ref 3.5–5.1)
SODIUM: 131 mmol/L — AB (ref 135–145)
Total Bilirubin: 1.2 mg/dL (ref 0.3–1.2)
Total Protein: 4.8 g/dL — ABNORMAL LOW (ref 6.5–8.1)

## 2017-04-18 LAB — GLUCOSE, CAPILLARY
GLUCOSE-CAPILLARY: 106 mg/dL — AB (ref 65–99)
GLUCOSE-CAPILLARY: 192 mg/dL — AB (ref 65–99)
Glucose-Capillary: 87 mg/dL (ref 65–99)
Glucose-Capillary: 198 mg/dL — ABNORMAL HIGH (ref 65–99)

## 2017-04-18 LAB — PROTIME-INR
INR: 2.57
PROTHROMBIN TIME: 27.4 s — AB (ref 11.4–15.2)

## 2017-04-18 LAB — AMMONIA: AMMONIA: 78 umol/L — AB (ref 9–35)

## 2017-04-18 LAB — MAGNESIUM: MAGNESIUM: 1.8 mg/dL (ref 1.7–2.4)

## 2017-04-18 MED ORDER — LORAZEPAM 0.5 MG PO TABS: 0.5000 mg | ORAL_TABLET | Freq: Four times a day (QID) | ORAL | Status: DC | PRN

## 2017-04-18 MED ORDER — ALBUMIN HUMAN 25 % IV SOLN
INTRAVENOUS | Status: AC
  Filled 2017-04-18: qty 100

## 2017-04-18 MED ORDER — OXYCODONE-ACETAMINOPHEN 5-325 MG PO TABS: 1.0000 | ORAL_TABLET | ORAL | Status: DC | PRN

## 2017-04-18 MED ORDER — GABAPENTIN 300 MG PO CAPS
300.0000 mg | ORAL_CAPSULE | Freq: Every day | ORAL | Status: DC
  Administered 2017-04-18: 300 mg via ORAL
  Filled 2017-04-18: qty 1

## 2017-04-18 MED ORDER — ESCITALOPRAM OXALATE 10 MG PO TABS
10.0000 mg | ORAL_TABLET | Freq: Every day | ORAL | Status: DC
  Administered 2017-04-19: 10 mg via ORAL
  Filled 2017-04-18: qty 1

## 2017-04-18 MED ORDER — LORAZEPAM 2 MG/ML IJ SOLN
0.5000 mg | Freq: Four times a day (QID) | INTRAMUSCULAR | Status: DC | PRN
  Administered 2017-04-18: 0.5 mg via INTRAVENOUS
  Filled 2017-04-18: qty 1

## 2017-04-18 MED ORDER — LACTULOSE 10 GM/15ML PO SOLN
20.0000 g | Freq: Three times a day (TID) | ORAL | Status: DC
  Administered 2017-04-18 (×2): 20 g via ORAL
  Filled 2017-04-18 (×3): qty 30

## 2017-04-18 MED ORDER — MORPHINE SULFATE (PF) 4 MG/ML IV SOLN: 4.0000 mg | INTRAVENOUS | Status: DC | PRN

## 2017-04-18 MED ORDER — WARFARIN SODIUM 2.5 MG PO TABS
2.5000 mg | ORAL_TABLET | Freq: Once | ORAL | Status: AC
  Administered 2017-04-18: 2.5 mg via ORAL
  Filled 2017-04-18: qty 1

## 2017-04-18 MED ORDER — LORAZEPAM 2 MG/ML PO CONC: 0.5000 mg | Freq: Four times a day (QID) | ORAL | Status: DC | PRN

## 2017-04-18 MED ORDER — ALBUMIN HUMAN 25 % IV SOLN
25.0000 g | Freq: Once | INTRAVENOUS | Status: AC
  Administered 2017-04-18: 25 g via INTRAVENOUS

## 2017-04-18 NOTE — Progress Notes (Signed)
PT Cancellation Note  Patient Details Name: Destiny [name] MRN: [id number] DOB: June 28, 1940   Cancelled Treatment:    Reason Eval/Treat Not Completed: Patient declined, no reason specified;Medical issues which prohibited therapy.  Pt not looking well at all.  Pt, husband and respiratory therapy all concurred. 04/18/2017  Donnella Sham, [place] [phone]  (pager)   Tessie Fass Trayton Szabo 04/18/2017, 5:08 PM

## 2017-04-18 NOTE — Progress Notes (Signed)
RT placed pt on BIPAP per MD. Mask secure. Mouth care done by RN. No breakdown noted on face

## 2017-04-18 NOTE — Progress Notes (Signed)
. Subjective:  Pt seen on HD - first time this week she has been alert enough to have a conversation- crying- saying she wants to quit.  I asked her how she thought this was going "Its not going well"  I told her we can take her off early today - if she did not want to do HD anymore she would pass away from her kidneys "FOrgive me but I dont care"  I told her she needed to share this with her husband  Objective Vital signs in last 24 hours: Vitals:   04/18/17 1030 04/18/17 1100 04/18/17 1130 04/18/17 1200  BP: (!) 121/41 (!) 131/46 (!) 124/57 (!) 128/55  Pulse: 70 69 71 68  Resp:      Temp:      TempSrc:      SpO2:      Weight:      Height:       Weight change: -3.3 kg (-4.4 oz)  Intake/Output Summary (Last 24 hours) at 04/18/2017 1214 Last data filed at 04/18/2017 0000 Gross per 24 hour  Intake 30 ml  Output -  Net 30 ml    Background: 76 y.o.year-old w PMH DM, MO, h/o NHL, chronic bivent HF with  PAF on anticoagulation, OHS/OSA on NIMV, pacer/ICD, CAD prior CABG, HLD,  Known advanced CKD Stage 5  followed by Dr. Marval Regal PTA. Multiple prior AKI on CKD usually cardiorenal. Admitted 03/30/17 w/ progressive SOB, bilateral pleural effusions, Failed diuretics. HD initiated week of Thanksgiving  1. New ESRDw/volume overload and progressive dyspnea- HD initiated around 11/19-11/20  1. S/P AVF 12/3 2. [facility];  Have been doing onTTS here, TRYING to transition to OP unit at some point- I have doubts as to whether pt can be strong enough to withstand continued HD- for OP she will need to be able to sit up for about 6 hours and she has not done anything near to that.  Palliative care met with them, they wanted to continue everything at the time.  Pt with pretty clear wishes to the opposite at this time- told her to talk to her husband- I concur that this has not gone well and dont see her having an improved QOL with the addition of HD- she has been on for 2 weeks and  if anything she looks worse  3.  Supporting BP with midodrine pre and mid  2. Systolic (EF 63-87) and diastolic HF with ECFV overload,post thoracentesis improved aeration-volume improved.  On midodrine as well as coreg low dose - still overloaded - will try to support HD with albumin here  4. Thrombocytopenia plts chronically low- 81 today- no heparin with HD 5. 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 Acute on chronic hypoxic resp failure. Multifactorial & severe  8. Anemia- 2/2 CKD. s/p Aranesp 60 every Saturday  and recent dose Feraheme. Hgb stable around 10    Genevive Printup A    Labs: Basic Metabolic Panel: Recent Labs  Lab 04/13/17 0326 04/17/17 0302 04/18/17 0459  NA 132* 133* 131*  K 4.1 4.1 3.7  CL 98* 96* 96*  CO2 '25 26 24  '$ GLUCOSE 161* 105* 100*  BUN 36* 41* 22*  CREATININE 3.15* 4.34* 3.13*  CALCIUM 8.1* 8.6* 8.1*   Liver Function Tests: Recent Labs  Lab 04/13/17 0326 04/17/17 0930 04/18/17 0459  AST 33 45* 37  ALT 13* 7* 7*  ALKPHOS 98 102 94  BILITOT 0.8 1.3* 1.2  PROT 4.6* 5.4*  4.8*  ALBUMIN 2.2* 2.5* 2.3*   No results for input(s): LIPASE, AMYLASE in the last 168 hours. Recent Labs  Lab 04/17/17 0930 04/18/17 0459  AMMONIA 73* 78*   CBC: Recent Labs  Lab 04/14/17 0313 04/15/17 0514 04/16/17 0138 04/17/17 0302 04/18/17 0459  WBC 5.2 6.0 6.9 6.5 5.0  NEUTROABS  --   --   --   --  2.8  HGB 9.7* 9.8* 10.0* 9.7* 9.5*  HCT 30.4* 31.5* 33.1* 31.7* 30.8*  MCV 92.4 92.6 94.0 93.2 94.5  PLT 44* 66* 68* 91* 81*   Cardiac Enzymes: No results for input(s): CKTOTAL, CKMB, CKMBINDEX, TROPONINI in the last 168 hours. CBG: Recent Labs  Lab 04/17/17 0745 04/17/17 1128 04/17/17 1639 04/17/17 2145 04/18/17 0749  GLUCAP 90 98 98 109* 87    Iron Studies: No results for input(s): IRON, TIBC, TRANSFERRIN, FERRITIN in the last 72 hours. Studies/Results: Ct Head Wo Contrast  Result Date: 04/17/2017 CLINICAL DATA:  Altered level  of consciousness. EXAM: CT HEAD WITHOUT CONTRAST TECHNIQUE: Contiguous axial images were obtained from the base of the skull through the vertex without intravenous contrast. COMPARISON:  08/07/2016 FINDINGS: Brain: There is atrophy and chronic small vessel disease changes. No acute intracranial abnormality. Specifically, no hemorrhage, hydrocephalus, mass lesion, acute infarction, or significant intracranial injury. Vascular: No hyperdense vessel or unexpected calcification. Skull: No acute calvarial abnormality. Sinuses/Orbits: No acute finding. Other: None IMPRESSION: No acute intracranial abnormality. Atrophy, chronic microvascular disease. Electronically Signed   By: Rolm Baptise M.D.   On: 04/17/2017 10:08   Dg Abd Portable 1v  Result Date: 04/17/2017 CLINICAL DATA:  Abdominal pain.  Dialysis patient EXAM: PORTABLE ABDOMEN - 1 VIEW COMPARISON:  CT 09/28/2016 FINDINGS: Nonobstructive bowel gas pattern. Ill-defined density left upper quadrant could be contrast filled bowel loops or could be outside of the patient such is a foreign body. No recent CT scan has been done at this location. Chronic fracture with vertebroplasty L2. No acute skeletal abnormality. AICD. Central line in the right atrium for dialysis IMPRESSION: Nonobstructive bowel gas pattern. Ill-defined density left upper quadrant could be opacified bowel loops, question recent abdominal CT at outside location. Atherosclerotic disease Compression fracture and vertebroplasty L2 Electronically Signed   By: Franchot Gallo M.D.   On: 04/17/2017 10:04   Medications: Infusions: . sodium chloride    . sodium chloride 10 mL/hr at 04/16/17 2208    Scheduled Medications: . amiodarone  200 mg Oral Daily  . atorvastatin  10 mg Oral q1800  . carvedilol  6.25 mg Oral BID WC  . darbepoetin (ARANESP) injection - DIALYSIS  60 mcg Intravenous Q Sat-HD  . feeding supplement (NEPRO CARB STEADY)  237 mL Oral TID BM  . Gerhardt's butt cream   Topical TID   . insulin aspart  0-9 Units Subcutaneous TID WC  . ipratropium-albuterol  3 mL Nebulization Q6H  . lactulose  20 g Oral TID  . levothyroxine  88 mcg Oral QAC breakfast  . multivitamin  1 tablet Oral QHS  . sodium chloride flush  3 mL Intravenous Q12H  . warfarin  2.5 mg Oral ONCE-1800  . Warfarin - Pharmacist Dosing Inpatient   Does not apply q1800    have reviewed scheduled and prn medications.  Physical Exam: General: alert, see above Heart: RRR Lungs: CBS bilat Abdomen: obese, soft Extremities: pitting edema Dialysis Access: right chest cath and right upper arm AVF- can feel thrill     04/18/2017,12:14 PM  LOS: 20 days

## 2017-04-18 NOTE — Procedures (Signed)
Patient was seen on dialysis and the procedure was supervised.  BFR 400  Via PC BP is  128/55.   Patient crying- saying I want to quit- took off bipap- pt wants to stop HD treatment early today.  I took opportunity to discuss further- see my PN for details  Erhard Senske A 04/18/2017

## 2017-04-18 NOTE — Plan of Care (Signed)
Pt remains dependent of all ADLs. Lethargic, drowsy, and difficult to arouse.

## 2017-04-18 NOTE — Progress Notes (Signed)
[facility] Progress Note  Patient: Destiny [name] DGU:[id number]   PCP: Mosie Lukes, MD DOB: 08-30-1940   DOA: 03/29/2017   DOS: 04/18/2017   Date of Service: the patient was seen and examined on 04/18/2017  Subjective: Patient initially was lethargic in the morning.  Later on on HD after being on BiPAP for 2 hours patient was more awake and was thirsty and wanted something to eat.  Later in the afternoon the patient mentioned that I do not care if I die but I cannot take it anymore.  She wanted to stop hemodialysis.  Husband was at bedside during this conversation.  Brief hospital course: Pt. 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  Prolonged stay in the hospital.  Initially treated with IV antibiotics without any improvement.  Diuresis was attempted but urine output was not significant due to chronic kidney disease.  Nephrology consulted and the patient is started on HD as a new ESRD during the stay.  AV fistula placed.  Started developing progressive lethargy over last few days thought to be metabolic. Currently further plan is to control anxiety, monitor overnight and await for family to make a decision.  Assessment and Plan: 1.  Acute metabolic encephalopathy. Acute uncompensated primary respiratory acidosis History of COPD.  Multifactorial, ESRD with uremia, recent infection treated with antibiotics, medication use gabapentin, Lexapro, Xanax. Poor p.o. intake and malnutrition also playing a role. Has morbid obesity and OSA and in the past hypercarbia has caused her lethargy. CT scan of the head is negative for any acute abnormality.   Ammonia level elevated.  Patient on lactulose.  EEG shows metabolic encephalopathy. Monitor in stepdown unit. ABG shows acute and compensated hypercarbia.  On BiPAP.  Will monitor. Her psychotropic medications were on hold but due to severe anxiety we will resume them.  2.  Acute on chronic hypoxic respiratory failure. Acute on chronic combined CHF. Morbid obesity. ESRD. Ejection fraction 20%. Abdominal distention with fluid overload. Aggressive diuresis was attempted on admission but did not tolerated. No improvement in urine output with that as well. Nephrology consulted due to chronic kidney disease and started on hemodialysis as a new ESRD patient. Has a temp cath and AV fistula placed on 04/15/2017. Also underwent right thoracentesis, meeting light's criteria for exudative infusion although felt less likely parapneumonic. No growth on culture.  3.  Hypothyroidism. TSH have been elevated in the past, recheck TSH and free T4. Continue levothyroxine for now.  4.  Type 2 diabetes mellitus. Patient with poor p.o. intake due to mentation changes. Holding NPH. Continue sliding scale insulin.  5.  Community-acquired pneumonia. Blood cultures so far negative. Treated with ceftriaxone and azithromycin during the early stay in the hospital. Monitor.  6.  Moderate ascites. Hypoalbuminemia. Third spacing. Likely hepatic steatosis as well as poor p.o. intake. Ultrasound on 04/12/2017 no acute abnormality.  Chronic hepatic cirrhosis. Hepatitis panel, hep B, HIV negative. LFTs are normal as well. Should improve with hemodialysis with volume status improvement.  7.  Mood disorder. Holding oral psychotropic medications in the event of hypercarbia. Monitor.  8.  Essential hypertension. Continue Coreg.  9.  Constipation. Patient is on Lomotil as well as  Imodium at home. Lomotil has been on hold, Imodium will also X-ray abdomen shows no evidence of obstruction.  Started on  lactulose.  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 History of non-Hodgkin's lymphoma followed by Dr. Ammie Dalton.  Patient has chronically low platelets as well as anemia. No active bleeding here.  11.  Paroxysmal A. fib. On Coumadin, INR therapeutic stop heparin bridging. Also on amiodarone. Currently normal sinus rhythm.  Diet: Renal diet DVT Prophylaxis: on therapeutic anticoagulation.  Advance goals of care discussion: Full code.  Palliative care consulted 04/13/2017 family wants to continue full treatment and aggressive care. Had a lengthy discussion with husband at 04/18/2017.  Patient wants to stop hemodialysis.  In that event husband wants to follow what the patient wishes but wants to discuss with her 3 son.  Inform husband that patient's prognosis is very poor with or without hemodialysis and with resumption of psychotropic medication or CO2 narcosis can get worse.  Husband understands that. Husband wants patient to be transitioned to DNR/DNI from full code. Husband wants to reevaluate her in the morning and make a decision from there regarding consideration for complete comfort with inpatient hospice versus continuing current care. Husband agrees with patient about no more hemodialysis.  Family Communication: family was present at bedside, at the time of interview. The pt provided permission to discuss medical plan with the family. Opportunity was given to ask question and all questions were answered satisfactorily.   Disposition:  Discharge to be determined.   Consultants: vascular surgery, Palliative care, nephrology Procedures: HD, AV fistula placement  Antibiotics: Anti-infectives (From admission, onward)   Start     Dose/Rate Route Frequency Ordered Stop   04/15/17 1100  cefUROXime (ZINACEF) 1.5 g in dextrose 5 % 50 mL IVPB     1.5 g 100 mL/hr over 30 Minutes Intravenous  Once 04/15/17 1059 04/15/17 1900   04/15/17 1051  dextrose 5 % with cefUROXime (ZINACEF) ADS Med    Comments:   Scronce, Trina   : cabinet override      04/15/17 1051 04/15/17 1125   04/15/17 1029  ceFAZolin (ANCEF) 2-4 GM/100ML-% IVPB  Status:  Discontinued    Comments:  Scronce, Trina   : cabinet override      04/15/17 1029 04/15/17 1054   04/14/17 1245  ceFAZolin (ANCEF) IVPB 2g/100 mL premix     2 g 200 mL/hr over 30 Minutes Intravenous  Once 04/14/17 1234 04/14/17 1854   04/12/17 0800  ceFAZolin (ANCEF) IVPB 2g/100 mL premix     2 g 200 mL/hr over 30 Minutes Intravenous To Short Stay 04/09/17 1656 04/10/17 0900   04/12/17 0600  cefUROXime (ZINACEF) 1.5 g in dextrose 5 % 50 mL IVPB  Status:  Discontinued     1.5 g 100 mL/hr over 30 Minutes Intravenous To Short Stay 04/11/17 0834 04/13/17 0600   03/31/17 1400  azithromycin (ZITHROMAX) tablet 500 mg  Status:  Discontinued     500 mg Oral Daily 03/31/17 1232 04/02/17 1744   03/30/17 1630  azithromycin (ZITHROMAX) 500 mg in dextrose 5 % 250 mL IVPB  Status:  Discontinued     500 mg 250 mL/hr over 60 Minutes Intravenous Every 24 hours 03/29/17 1502 03/31/17 1231   03/30/17 1600  cefTRIAXone (ROCEPHIN) 1 g in dextrose 5 % 50 mL IVPB  Status:  Discontinued     1 g 100 mL/hr over 30 Minutes Intravenous Every 24 hours 03/29/17 1502 04/02/17 1744   03/29/17 1430  cefTRIAXone (ROCEPHIN) 1 g in dextrose  5 % 50 mL IVPB     1 g 100 mL/hr over 30 Minutes Intravenous  Once 03/29/17 1424 03/29/17 1647   03/29/17 1430  azithromycin (ZITHROMAX) 500 mg in dextrose 5 % 250 mL IVPB     500 mg 250 mL/hr over 60 Minutes Intravenous  Once 03/29/17 1424 03/29/17 1716       Objective: Physical Exam: Vitals:   04/18/17 1457 04/18/17 1532 04/18/17 1708 04/18/17 1718  BP: (!) 137/46     Pulse: 70  70 70  Resp: 20  (!) 21 19  Temp: 97.8 F (36.6 C)     TempSrc: Axillary     SpO2: 90% 90% 94% 96%  Weight:      Height:        Intake/Output Summary (Last 24 hours) at 04/18/2017 1841 Last data filed at 04/18/2017 1213 Gross per 24 hour  Intake 30 ml  Output  2668 ml  Net -2638 ml   Filed Weights   04/18/17 0541 04/18/17 0826 04/18/17 1213  Weight: 95.5 kg (210 lb 8.6 oz) 94.6 kg (208 lb 8.9 oz) 91.4 kg (201 lb 8 oz)   General: Alert, Awake and oriented to place and person, following commands Appear in moderate distress, affect irritable Eyes: PERRL, Conjunctiva normal ENT: Oral Mucosa clear dry. Neck: difficult to assess JVD, no Abnormal Mass Or lumps Cardiovascular: S1 and S2 Present, no Murmur, Peripheral Pulses Present Respiratory: increased respiratory effort, Bilateral Air entry equal and Decreased, no use of accessory muscle, basal Crackles, no wheezes Abdomen: Bowel Sound present, Soft and mild tenderness, no hernia Skin: no redness, no Rash, no induration Extremities: bilateral Pedal edema, no calf tenderness Neurologic: Grossly no focal neuro deficit.  Spontaneously moving all extremities.  Reflexes difficult to elicit.  Not following any command.  Data Reviewed: CBC: Recent Labs  Lab 04/14/17 0313 04/15/17 0514 04/16/17 0138 04/17/17 0302 04/18/17 0459  WBC 5.2 6.0 6.9 6.5 5.0  NEUTROABS  --   --   --   --  2.8  HGB 9.7* 9.8* 10.0* 9.7* 9.5*  HCT 30.4* 31.5* 33.1* 31.7* 30.8*  MCV 92.4 92.6 94.0 93.2 94.5  PLT 44* 66* 68* 91* 81*   Basic Metabolic Panel: Recent Labs  Lab 04/12/17 0736 04/13/17 0326 04/17/17 0302 04/18/17 0459  NA 131* 132* 133* 131*  K 4.8 4.1 4.1 3.7  CL 100* 98* 96* 96*  CO2 21* 25 26 24   GLUCOSE 112* 161* 105* 100*  BUN 34* 36* 41* 22*  CREATININE 2.70* 3.15* 4.34* 3.13*  CALCIUM 8.1* 8.1* 8.6* 8.1*  MG  --   --   --  1.8    Liver Function Tests: Recent Labs  Lab 04/13/17 0326 04/17/17 0930 04/18/17 0459  AST 33 45* 37  ALT 13* 7* 7*  ALKPHOS 98 102 94  BILITOT 0.8 1.3* 1.2  PROT 4.6* 5.4* 4.8*  ALBUMIN 2.2* 2.5* 2.3*   No results for input(s): LIPASE, AMYLASE in the last 168 hours. Recent Labs  Lab 04/17/17 0930 04/18/17 0459  AMMONIA 73* 78*   Coagulation  Profile: Recent Labs  Lab 04/14/17 0313 04/15/17 0514 04/16/17 0138 04/17/17 0302 04/18/17 0459  INR 1.38 1.56 1.66 2.48 2.57   Cardiac Enzymes: No results for input(s): CKTOTAL, CKMB, CKMBINDEX, TROPONINI in the last 168 hours. BNP (last 3 results) No results for input(s): PROBNP in the last 8760 hours. CBG: Recent Labs  Lab 04/17/17 1639 04/17/17 2145 04/18/17 0749 04/18/17 1243 04/18/17 1637  GLUCAP 98 109*  87 106* 192*   Studies: No results found.  Scheduled Meds: . amiodarone  200 mg Oral Daily  . atorvastatin  10 mg Oral q1800  . carvedilol  6.25 mg Oral BID WC  . darbepoetin (ARANESP) injection - DIALYSIS  60 mcg Intravenous Q Sat-HD  . escitalopram  10 mg Oral Daily  . feeding supplement (NEPRO CARB STEADY)  237 mL Oral TID BM  . gabapentin  300 mg Oral QHS  . Gerhardt's butt cream   Topical TID  . insulin aspart  0-9 Units Subcutaneous TID WC  . ipratropium-albuterol  3 mL Nebulization Q6H  . lactulose  20 g Oral TID  . levothyroxine  88 mcg Oral QAC breakfast  . multivitamin  1 tablet Oral QHS  . sodium chloride flush  3 mL Intravenous Q12H  . warfarin  2.5 mg Oral ONCE-1800  . Warfarin - Pharmacist Dosing Inpatient   Does not apply q1800   Continuous Infusions: . sodium chloride    . sodium chloride 10 mL/hr at 04/16/17 2208   PRN Meds: sodium chloride, acetaminophen, fluticasone, ipratropium-albuterol, LORazepam, LORazepam, midodrine, morphine injection, ondansetron (ZOFRAN) IV, oxyCODONE-acetaminophen, sodium chloride, sodium chloride flush  Time spent: The patient is critically ill with multiple organ systems failure and requires high complexity decision making for assessment and support, frequent evaluation and titration of therapies. Critical Care Time devoted to patient care services described in this note is 40 minutes   Author: Berle Mull, MD [facility] Pager: [phone] 04/18/2017 6:41 PM  If 7PM-7AM, please contact  night-coverage at [email or web address], password [name]

## 2017-04-18 NOTE — Progress Notes (Signed)
ANTICOAGULATION CONSULT NOTE - Follow Up Consult  Pharmacy Consult for Coumadin Indication: atrial fibrillation  Allergies  Allergen Reactions  . Sulfonamide Derivatives Other (See Comments)    UNSPECIFIED REACTION OF CHILDHOOD [per Mother]    Patient Measurements: Height: 5\' 4"  (162.6 cm) Weight: 210 lb 8.6 oz (95.5 kg) IBW/kg (Calculated) : 54.7 Heparin Dosing Weight: 80 kg  Vital Signs: Temp: 98.8 F (37.1 C) (12/06 0541) Temp Source: Axillary (12/06 0541) BP: 110/48 (12/06 0541) Pulse Rate: 71 (12/06 0541)  Labs: Recent Labs    04/16/17 0138 04/16/17 1847 04/17/17 0302 04/18/17 0459  HGB 10.0*  --  9.7* 9.5*  HCT 33.1*  --  31.7* 30.8*  PLT 68*  --  91* 81*  LABPROT 19.4*  --  26.6* 27.4*  INR 1.66  --  2.48 2.57  HEPARINUNFRC 0.28* 0.62  --   --   CREATININE  --   --  4.34* 3.13*    Assessment: 76 yr old female on Coumadin prior to admission for atrial fibrillation and history of mural thrombus. INR was therapeutic on admit then increased. Surgical procedure was needed so on 11/23 patient received Vit K 5mg  po x1. Coumadin was held 11/20 - 11/28, then resumed 11/29 to 12/1. Coumadin was held again 12/2 for AV fistula placement 12/3.   INR remains therapeutic this morning and beginning to plateau out. Hgb 9.5, PLTc remains low stable.    Coumadin regimen PTA:  2.5 mg daily except 5 mg on Mondays.  Goal of Therapy:  INR 2-3 Monitor platelets by anticoagulation protocol: Yes   Plan:  1. Coumadin 2.5 mg x 1 this evening  2. Daily INR   Vincenza Hews, PharmD, BCPS 04/18/2017, 8:28 AM Clinical phone 04/18/2017 until 3:30 PM - [phone]

## 2017-04-18 NOTE — Care Management Note (Signed)
Case Management Note  Patient Details  Name: Destiny [name] MRN: [id number] Date of Birth: [birth year]  Subjective/Objective:     Acute metabolic encephalopathy, ESRD-new start HD, acute on chronic hypoxic resp failure              Action/Plan: Discharge Planning: NCM spoke to pt's husband at bedside. Pt was unable to speak due to pain. Husband states pt does not want dialysis and he wanted to discuss with attending. Contacted attending. Notified [facility] to cancel referral.    Expected Discharge Date:  04/02/17               Expected Discharge Plan:  [place]  In-House Referral:  Clinical Social Work  Discharge planning Services  CM Consult  Post Acute Care Choice:  NA Choice offered to:  NA  DME Arranged:  N/A DME Agency:  NA  HH Arranged:  NA HH Agency:  NA  Status of Service:  In process, will continue to follow  If discussed at Long Length of Stay Meetings, dates discussed:  04/18/2017  Additional Comments:  Erenest Rasher, RN 04/18/2017, 3:44 PM

## 2017-04-18 NOTE — Progress Notes (Signed)
Transported pt on BIPAP 44/5,14% with no complications. Neb tx done. Mask secure. No breakdown on face

## 2017-04-19 LAB — GLUCOSE, CAPILLARY
GLUCOSE-CAPILLARY: 169 mg/dL — AB (ref 65–99)
Glucose-Capillary: 146 mg/dL — ABNORMAL HIGH (ref 65–99)

## 2017-04-19 LAB — PROTIME-INR
INR: 2.3
PROTHROMBIN TIME: 25.1 s — AB (ref 11.4–15.2)

## 2017-04-19 MED ORDER — HALOPERIDOL LACTATE 2 MG/ML PO CONC
0.5000 mg | ORAL | Status: DC | PRN
  Filled 2017-04-19: qty 0.3

## 2017-04-19 MED ORDER — NYSTATIN 100000 UNIT/GM EX POWD
Freq: Three times a day (TID) | CUTANEOUS | Status: DC | PRN
  Filled 2017-04-19: qty 15

## 2017-04-19 MED ORDER — OXYCODONE HCL 20 MG/ML PO CONC: 5.0000 mg | ORAL | Status: DC | PRN

## 2017-04-19 MED ORDER — CARVEDILOL 6.25 MG PO TABS: 6.2500 mg | ORAL_TABLET | Freq: Two times a day (BID) | ORAL | 0 refills | Status: AC

## 2017-04-19 MED ORDER — HALOPERIDOL 0.5 MG PO TABS
0.5000 mg | ORAL_TABLET | ORAL | Status: DC | PRN
  Filled 2017-04-19: qty 1

## 2017-04-19 MED ORDER — NEPRO/CARBSTEADY PO LIQD: 237.0000 mL | Freq: Three times a day (TID) | ORAL | 0 refills | Status: AC

## 2017-04-19 MED ORDER — ONDANSETRON 4 MG PO TBDP
4.0000 mg | ORAL_TABLET | Freq: Four times a day (QID) | ORAL | Status: DC | PRN
  Filled 2017-04-19: qty 1

## 2017-04-19 MED ORDER — LORAZEPAM 2 MG/ML IJ SOLN
1.0000 mg | INTRAMUSCULAR | Status: DC | PRN
  Administered 2017-04-19: 1 mg via INTRAVENOUS
  Filled 2017-04-19: qty 1

## 2017-04-19 MED ORDER — LORAZEPAM 1 MG PO TABS: 1.0000 mg | ORAL_TABLET | ORAL | Status: DC | PRN

## 2017-04-19 MED ORDER — ACETAMINOPHEN 325 MG PO TABS: 650.0000 mg | ORAL_TABLET | Freq: Four times a day (QID) | ORAL | Status: DC | PRN

## 2017-04-19 MED ORDER — ONDANSETRON HCL 4 MG/2ML IJ SOLN: 4.0000 mg | Freq: Four times a day (QID) | INTRAMUSCULAR | Status: DC | PRN

## 2017-04-19 MED ORDER — LACTULOSE 10 GM/15ML PO SOLN: 20.0000 g | Freq: Three times a day (TID) | ORAL | 0 refills | Status: AC

## 2017-04-19 MED ORDER — BIOTENE DRY MOUTH MT LIQD: 15.0000 mL | OROMUCOSAL | Status: DC | PRN

## 2017-04-19 MED ORDER — GLYCOPYRROLATE 0.2 MG/ML IJ SOLN: 0.2000 mg | INTRAMUSCULAR | Status: DC | PRN

## 2017-04-19 MED ORDER — SODIUM CHLORIDE 0.9% FLUSH: 3.0000 mL | INTRAVENOUS | Status: DC | PRN

## 2017-04-19 MED ORDER — SODIUM CHLORIDE 0.9 % IV SOLN: 250.0000 mL | INTRAVENOUS | Status: DC | PRN

## 2017-04-19 MED ORDER — GLYCOPYRROLATE 1 MG PO TABS: 1.0000 mg | ORAL_TABLET | ORAL | Status: DC | PRN

## 2017-04-19 MED ORDER — SODIUM CHLORIDE 0.9% FLUSH
3.0000 mL | Freq: Two times a day (BID) | INTRAVENOUS | Status: DC
  Administered 2017-04-19: 3 mL via INTRAVENOUS

## 2017-04-19 MED ORDER — POLYVINYL ALCOHOL 1.4 % OP SOLN
1.0000 [drp] | Freq: Four times a day (QID) | OPHTHALMIC | Status: DC | PRN
  Filled 2017-04-19: qty 15

## 2017-04-19 MED ORDER — LORAZEPAM 2 MG/ML PO CONC: 1.0000 mg | ORAL | Status: DC | PRN

## 2017-04-19 MED ORDER — HYDROMORPHONE HCL 1 MG/ML IJ SOLN: 0.5000 mg | INTRAMUSCULAR | Status: DC | PRN

## 2017-04-19 MED ORDER — HALOPERIDOL LACTATE 5 MG/ML IJ SOLN: 0.5000 mg | INTRAMUSCULAR | Status: DC | PRN

## 2017-04-19 MED ORDER — ACETAMINOPHEN 650 MG RE SUPP: 650.0000 mg | Freq: Four times a day (QID) | RECTAL | Status: DC | PRN

## 2017-04-19 NOTE — Progress Notes (Signed)
  Request to remove tunneled HD catheter, however patient's INR is 2.3.  According to the Interventional Radiology Anticoagulation Guidelines, the INR needs to be <  Or = to 1.5 to safely remove the catheter.  Patient has decided to stop hemodialysis and be discharged with hospice care.  There is no real need to remove the catheter, however, if the patient and/or family insists on removal, she can return as an outpatient for removal.    She would need to hold her Warfarin x 5 day prior to procedure.   WENDY S BLAIR PA-C 04/19/2017 1:35 PM

## 2017-04-19 NOTE — Progress Notes (Signed)
Occupational Therapy Treatment and Discharge Patient Details Name: Destiny [name] MRN: [id number] DOB: July 04, 1940 Today's Date: 04/19/2017    History of present illness Pt is a 76 y.o. female who presented to the ED with 2 day history of worsening shortness of breath. Found to have B pleural effusions and volume overload. Attempted diuresis but ultimately required HD, new dx ESRD. AV graft placed 12/3.  PMH significant for CHF with EF 20% with AICD, COPD on home oxygen of 2L, CKD stage IV, obesity, HTN, anemia, pressure injury, and diabetes.    OT comments  Pt with lethargy. Assisted with positioning in bed. Per husband, pt has decided to stop HD and he is in agreement. OT signing off.  Follow Up Recommendations  Supervision/Assistance - 24 hour(Hospice )    Equipment Recommendations       Recommendations for Other Services      Precautions / Restrictions Precautions Precautions: Fall Precaution Comments: Watch O2 saturation       Mobility Bed Mobility Overal bed mobility: Needs Assistance Bed Mobility: Rolling Rolling: +2 for physical assistance;Total assist         General bed mobility comments: pulled up in bed with +2 total assist and floated heels on pillow  Transfers                      Balance                                           ADL either performed or assessed with clinical judgement   ADL                                               Vision       Perception     Praxis      Cognition Arousal/Alertness: Lethargic Behavior During Therapy: Flat affect Overall Cognitive Status: Impaired/Different from baseline                                 General Comments: pt not communicative        Exercises     Shoulder Instructions       General Comments      Pertinent Vitals/ Pain       Pain Assessment: Faces Faces Pain Scale: No hurt  Home Living                                           Prior Functioning/Environment              Frequency           Progress Toward Goals  OT Goals(current goals can now be found in the care plan section)  Progress towards OT goals: (Pt's desire is for comfort.)  Acute Rehab OT Goals Patient Stated Goal: for pt to be comfortable--husband  Plan Other (comment)(husband verbalizing desire for comfort care)    Co-evaluation                 AM-PAC PT "6 Clicks" Daily Activity     Outcome Measure  Help from another person eating meals?: Total Help from another person taking care of personal grooming?: Total Help from another person toileting, which includes using toliet, bedpan, or urinal?: Total Help from another person bathing (including washing, rinsing, drying)?: Total Help from another person to put on and taking off regular upper body clothing?: Total Help from another person to put on and taking off regular lower body clothing?: Total 6 Click Score: 6    End of Session Equipment Utilized During Treatment: Oxygen  OT Visit Diagnosis: Muscle weakness (generalized) (M62.81)   Activity Tolerance Patient limited by lethargy   Patient Left in bed;with call bell/phone within reach;with family/visitor present   Nurse Communication          Time: 1025-1040 OT Time Calculation (min): 15 min  Charges: OT General Charges $OT Visit: 1 Visit OT Treatments $Therapeutic Activity: 8-22 mins  04/19/2017 Nestor Lewandowsky, OTR/L Pager: [place], Haze Boyden 04/19/2017, 10:45 AM

## 2017-04-19 NOTE — Progress Notes (Signed)
I see plan is to stop HD and to pursue residential hospice.  After talking to her yesterday I know that this is what she wants to do.  I will inform her primary nephrologist- renal service will sign off.   Destiny [name] A

## 2017-04-19 NOTE — Progress Notes (Addendum)
RT came to do neb tx. Pt unavailable at this time. CNA and staff at the bedside. Pt bathing. RT will continue to follow

## 2017-04-19 NOTE — Progress Notes (Signed)
CSW made aware that patient and family have decided to stop dialysis treatments and would like to move forward with residential hospice placement. CSW met with patient and spouse at bedside. Patient appeared uncomfortable in bed, moaning, and did not participate in conversation.   Spouse agreeable to hospice placement and prefers [facility]. Spouse indicated other hospice facilities will be a hardship to travel to, due to distance. CSW made spouse aware that if [facility] does not have bed, another facility will have to be considered.  CSW made referral to [facility], and will follow for discharge planning to hospice. CSW to make additional referrals if no beds at [facility].  Estanislado Emms, [place]

## 2017-04-19 NOTE — Progress Notes (Signed)
Patient will discharge to [facility]. Anticipated discharge date: 04/19/17 Family notified: Madelina Sanda, spouse Transportation by: [facility]  Nurse to call report to [phone].   CSW signing off.  Estanislado Emms, [place]  Clinical Social Worker

## 2017-04-19 NOTE — Discharge Summary (Addendum)
[facility] Discharge Summary   Patient: Destiny [name] CBJ:[id number]   PCP: Mosie Lukes, MD DOB: 1941-01-03   Date of admission: 03/29/2017   Date of discharge:  04/19/2017    Discharge Diagnoses:  Principal Problem:   Acute on chronic combined systolic and diastolic CHF (congestive heart failure) (HCC) Active Problems:   Diabetes mellitus type 2 with retinopathy ([place])   Hyperlipidemia   Essential hypertension   Automatic implantable cardioverter-defibrillator in situ   Hypothyroidism   Anemia   Morbid obesity ([place])   Acute encephalopathy   COPD, severe (HCC)   CKD (chronic kidney disease) stage 4, GFR 15-29 ml/min ([place])   Community acquired pneumonia   Acute hypoxemic respiratory failure (HCC)   Pressure injury of skin   Admitted From: home Disposition: Inpatient hospice  Recommendations for Outpatient Follow-up:  1. Establish care with inpatient hospice.  Contact information for after-discharge care    Destination    [facility] .   Service:  Skilled Nursing Contact information: [address] [place] [phone]             Diet recommendation: Comfort.  General diet  Activity: The patient is advised to gradually reintroduce usual activities.  Discharge Condition: stable  Code Status: DNR/DNI  History of present illness: As per the H and P dictated on admission, "Destiny [name] is a obese 76 y.o. 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  Apparently she has been struggling with diarrhea for the last 2 weeks and she decided it would be best to discontinue her home torsemide on account of possible dehydration.  She appears to  have had good oral intake with no nausea or vomiting.  She states that she has not had any diarrhea today and denies any fevers or chills at home along with any abdominal cramping.  She denies any weight gain or recent lower extremity edema.  Of note, she is very concerned about the battery in her pacemaker/AICD for which she requires replacement soon on 11/20. "  Hospital Course:  Summary of her active problems in the hospital is as following. 1.  Acute metabolic encephalopathy. Acute uncompensated primary respiratory acidosis History of COPD.  Multifactorial, ESRD with uremia, recent infection treated with antibiotics, medication use gabapentin, Lexapro, Xanax. Poor p.o. intake and malnutrition also playing a role. Has morbid obesity and OSA and in the past hypercarbia has caused her lethargy. CT scan of the head is negative for any acute abnormality.  Ammonia level elevated.  Patient on lactulose.  EEG shows metabolic encephalopathy. Monitor in stepdown unit. ABG shows acute and compensated hypercarbia.  On BiPAP.  Will monitor. Her psychotropic medications were on hold but due to severe anxiety we will resume them due to transition to complete comfort.  2.  Acute on chronic hypoxic respiratory failure. Acute on chronic combined CHF. Morbid obesity. ESRD. Ejection fraction 20%. Abdominal distention with fluid overload. Aggressive diuresis was attempted on admission but did not tolerated. No improvement in urine output with that as well. Nephrology consulted due to chronic kidney disease and started on hemodialysis as a new ESRD patient. Has a temp cath and AV fistula placed on 04/15/2017. Also underwent right thoracentesis, meeting light's  criteria for exudative infusion although felt less likely parapneumonic. No growth on culture. Patient has AICD, [employer] person was consulted to turn it off, now that the patient has transition to complete comfort.  3.  Hypothyroidism. TSH  have been elevated in the past, recheck TSH and free T4. Continue levothyroxine for now.  4.  Type 2 diabetes mellitus. Patient with poor p.o. intake due to mentation changes. Holding insulin  5.  Community-acquired pneumonia. Blood cultures so far negative. Treated with ceftriaxone and azithromycin during the early stay in the hospital.  6.  Moderate ascites. Hypoalbuminemia. Third spacing. Likely hepatic steatosis as well as poor p.o. intake. Ultrasound on 04/12/2017 no acute abnormality.  Chronic hepatic cirrhosis. Hepatitis panel, hep B, HIV negative. LFTs are normal as well.  7.  Mood disorder. Holding oral psychotropic medications in the event of hypercarbia. Monitor.  8.  Essential hypertension. Continue Coreg.  9.  Constipation. Hyperammonemia Patient is on Lomotil as well as Imodium at home. Lomotil has been on hold, Imodium will also X-ray abdomen shows no evidence of obstruction.  Started on lactulose.  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 History of non-Hodgkin's lymphoma followed by Dr. Ammie Dalton.  Patient has chronically low platelets as well as anemia. No active bleeding here.  11.  Paroxysmal A. fib. On Coumadin, INR therapeutic stop heparin bridging. Also on amiodarone. Currently normal sinus rhythm.  12.  Goals of care discussion. Palliative care consulted 04/13/2017 family wants to continue full treatment and aggressive care. Had a lengthy discussion with husband at 04/18/2017.  Patient wants to stop hemodialysis.  In that event husband wants to follow what the patient wishes but wants to discuss with her 3 son.  Inform husband that patient's prognosis is very poor with or without hemodialysis and with resumption of psychotropic medication or CO2 narcosis can get worse.  Husband understands that. Husband wants patient to be transitioned to DNR/DNI from full code. Husband agrees with patient about no more hemodialysis. After discussing with patient's son  husband in the morning decided to transition patient to complete comfort with intention to move her to inpatient hospice. He would like the patient to continue on BiPAP as much as she can tolerate and would like to bring her own device to the hospice facility.   On the day of the discharge the patient's symptoms were controlled, and no other acute medical condition were reported by patient. the patient was felt safe to be discharge at [facility] with patient hospice.  Procedures and Results:  EEG  Right upper arm AV fistula creation  TDC placement IR guided  Hemodialysis  Ultrasound-guided thoracentesis  Echocardiogram  Consultations:  Interventional radiology  Nephrology  Palliative care  DISCHARGE MEDICATION: Allergies as of 04/19/2017      Reactions   Sulfonamide Derivatives Other (See Comments)   UNSPECIFIED REACTION OF CHILDHOOD [per Mother]      Medication List    STOP taking these medications   amiodarone 200 MG tablet Commonly known as:  PACERONE   atorvastatin 10 MG tablet Commonly known as:  LIPITOR   dicyclomine 20 MG tablet Commonly known as:  BENTYL   diphenoxylate-atropine 2.5-0.025 MG tablet Commonly known as:  LOMOTIL   glucose blood test strip Commonly known as:  FREESTYLE LITE   insulin NPH Human 100 UNIT/ML injection Commonly known as:  HUMULIN N,NOVOLIN N   metolazone 2.5 MG tablet Commonly known as:  ZAROXOLYN   OXYGEN   torsemide 20 MG tablet Commonly known as:  DEMADEX  warfarin 5 MG tablet Commonly known as:  COUMADIN     TAKE these medications   acetaminophen 500 MG tablet Commonly known as:  TYLENOL Take 1,000 mg every 6 (six) hours as needed by mouth for mild pain or headache.   albuterol 108 (90 Base) MCG/ACT inhaler Commonly known as:  PROVENTIL HFA;VENTOLIN HFA Inhale 2 puffs into the lungs every 6 (six) hours as needed for wheezing or shortness of breath.   carvedilol 6.25 MG tablet Commonly known as:   COREG Take 1 tablet (6.25 mg total) by mouth 2 (two) times daily with a meal. What changed:    medication strength  how much to take  when to take this   escitalopram 10 MG tablet Commonly known as:  LEXAPRO TAKE 1 TABLET EVERY DAY What changed:    how much to take  how to take this  when to take this   feeding supplement (NEPRO CARB STEADY) Liqd Take 237 mLs by mouth 3 (three) times daily between meals.   fluticasone 50 MCG/ACT nasal spray Commonly known as:  FLONASE Place 2 sprays into both nostrils daily as needed for allergies or rhinitis. As needed for nasal stuffiness. What changed:  additional instructions   gabapentin 300 MG capsule Commonly known as:  NEURONTIN Take 1 capsule (300 mg total) by mouth at bedtime.   lactulose 10 GM/15ML solution Commonly known as:  CHRONULAC Take 30 mLs (20 g total) by mouth 3 (three) times daily.   levothyroxine 88 MCG tablet Commonly known as:  SYNTHROID, LEVOTHROID Take 1 tablet (88 mcg total) by mouth daily.      Allergies  Allergen Reactions  . Sulfonamide Derivatives Other (See Comments)    UNSPECIFIED REACTION OF CHILDHOOD [per Mother]   Discharge Instructions    Diet general   Complete by:  As directed    Increase activity slowly   Complete by:  As directed      Discharge Exam: Filed Weights   04/18/17 0826 04/18/17 1213 04/19/17 0553  Weight: 94.6 kg (208 lb 8.9 oz) 91.4 kg (201 lb 8 oz) 92.4 kg (203 lb 11.3 oz)   Vitals:   04/19/17 0553 04/19/17 1109  BP: (!) 118/48 (!) 124/48  Pulse: 74 69  Resp: 20 19  Temp: 97.9 F (36.6 C) 98 F (36.7 C)  SpO2: 97% 96%   General: Appear in mild distress, no Rash; Oral Mucosa dry. Cardiovascular: S1 and S2 Present, aortic systolic Murmur, no JVD Respiratory: Bilateral Air entry present and basal Crackles, no wheezes Abdomen: Bowel Sound present, Soft and mild tenderness Extremities: bilateral Pedal edema, no calf tenderness Neurology: Agitated and  confused  The results of significant diagnostics from this hospitalization (including imaging, microbiology, ancillary and laboratory) are listed below for reference.    Significant Diagnostic Studies: Dg Chest 1 View  Result Date: 04/02/2017 CLINICAL DATA:  Shortness of breath, post thoracentesis EXAM: CHEST 1 VIEW COMPARISON:  04/01/2017 FINDINGS: Small right pleural effusion.  No pneumothorax is seen. Left lung base is obscured. Cardiomegaly.  Mild interstitial edema. Left subclavian ICD. Median sternotomy. IMPRESSION: No pneumothorax is seen status post thoracentesis. Cardiomegaly with mild interstitial edema and small right pleural effusion. Electronically Signed   By: Julian Hy M.D.   On: 04/02/2017 13:03   Dg Chest 2 View  Result Date: 03/29/2017 CLINICAL DATA:  Shortness of Breath EXAM: CHEST  2 VIEW COMPARISON:  March 14, 2017 FINDINGS: There are pleural effusions bilaterally. There is patchy alveolar opacity in the right  mid and lower lung zones. There is atelectasis in the left base. Heart is mildly enlarged with pulmonary vascularity within normal limits. Pacemaker leads are attached to the right atrium and middle cardiac vein. Patient is status post coronary artery bypass grafting. No adenopathy. No bone lesions. IMPRESSION: Mild cardiomegaly with pleural effusions bilaterally. Airspace opacity right mid lower lung zones, suspicious for pneumonia. Left base atelectasis. Pacemaker leads attached to right atrium and middle cardiac vein. Electronically Signed   By: Lowella Grip III M.D.   On: 03/29/2017 13:17   Ct Head Wo Contrast  Result Date: 04/17/2017 CLINICAL DATA:  Altered level of consciousness. EXAM: CT HEAD WITHOUT CONTRAST TECHNIQUE: Contiguous axial images were obtained from the base of the skull through the vertex without intravenous contrast. COMPARISON:  08/07/2016 FINDINGS: Brain: There is atrophy and chronic small vessel disease changes. No acute intracranial  abnormality. Specifically, no hemorrhage, hydrocephalus, mass lesion, acute infarction, or significant intracranial injury. Vascular: No hyperdense vessel or unexpected calcification. Skull: No acute calvarial abnormality. Sinuses/Orbits: No acute finding. Other: None IMPRESSION: No acute intracranial abnormality. Atrophy, chronic microvascular disease. Electronically Signed   By: Rolm Baptise M.D.   On: 04/17/2017 10:08   US Abdomen Complete  Result Date: 04/12/2017 CLINICAL DATA:  Abdominal bloating. EXAM: ABDOMEN ULTRASOUND COMPLETE COMPARISON:  Abdominal CT 09/28/2016 FINDINGS: Gallbladder: Not visualized, fluid in the gallbladder fossa. Common bile duct: Diameter: 5 mm. Liver: No focal lesion identified. Increased in parenchymal echogenicity. No definite capsular irregularity. Portal vein is patent on color Doppler imaging with normal direction of blood flow towards the liver. IVC: No abnormality visualized. Pancreas: Not well evaluated due to bowel gas. Spleen: Enlarged measuring 15.9 x 14.9 x 8.5 cm. Echogenic foci consistent with calcifications as seen on prior CT. Right Kidney: Length: 10.4 cm. Thinning of renal parenchyma with increased renal echogenicity. Complex cystic lesion in the mid kidney measures 1.8 cm with probable internal septations. Echogenic structures the hila likely vascular. No hydronephrosis. Left Kidney: Length: 9.0 cm. Thinning of renal parenchyma with increased renal echogenicity. No focal lesion. No hydronephrosis. Abdominal aorta: No aneurysm visualized. Technically limited evaluation due to bowel gas. Other findings: Moderate intra-abdominal ascites. IMPRESSION: 1. Moderate intra-abdominal ascites. 2. Increased hepatic echogenicity may reflect hepatic steatosis or other chronic liver disease. 3. Splenomegaly. 4. Bilateral renal atrophy with parenchymal thinning consistent with chronic medical renal disease. Electronically Signed   By: Jeb Levering M.D.   On: 04/12/2017  22:18   Ir Fluoro Guide Cv Line Left  Result Date: 04/05/2017 INDICATION: Chronic kidney disease and fluid overload. Patient needs urgent dialysis. Plan for non tunneled catheter followed by a tunneled catheter when the patient's INR level has normalized. EXAM: FLUOROSCOPIC AND ULTRASOUND GUIDED PLACEMENT OF A NON-TUNNELED DIALYSIS CATHETER Physician: Stephan Minister. Henn, MD MEDICATIONS: None ANESTHESIA/SEDATION: None FLUOROSCOPY TIME:  Fluoroscopy Time: 24 seconds, 5 mGy COMPLICATIONS: None immediate. PROCEDURE: Informed consent was obtained for catheter placement. The patient was placed supine on the interventional table. Ultrasound confirmed a patent left internal jugular vein. Ultrasound images were obtained for documentation. The left side of the neck was prepped and draped in a sterile fashion. The left side of the neck was anesthetized with 1% lidocaine. Maximal barrier sterile technique was utilized including caps, mask, sterile gowns, sterile gloves, sterile drape, hand hygiene and skin antiseptic. A small incision was made with #11 blade scalpel. A 21 gauge needle directed into the left internal jugular vein with ultrasound guidance. A micropuncture dilator set was placed. A 20  cm Mahurkar catheter was selected. The catheter was advanced over a wire and positioned at the superior cavoatrial junction. Fluoroscopic images were obtained for documentation. Both dialysis lumens were found to aspirate and flush well. The proper amount of heparin was flushed in both lumens. The central venous lumen was flushed with normal saline. Catheter was sutured to skin. FINDINGS: Catheter tip at the superior cavoatrial junction. IMPRESSION: Successful placement of a left jugular non-tunneled dialysis catheter using ultrasound and fluoroscopic guidance. Electronically Signed   By: Markus Daft M.D.   On: 04/05/2017 15:04   Ir Fluoro Guide Cv Line Right  Result Date: 04/10/2017 INDICATION: End-stage renal disease. In need of  tunneled dialysis catheter placement for continuation of dialysis. EXAM: TUNNELED CENTRAL VENOUS HEMODIALYSIS CATHETER PLACEMENT WITH ULTRASOUND AND FLUOROSCOPIC GUIDANCE MEDICATIONS: Ancef 2 gm IV . The antibiotic was given in an appropriate time interval prior to skin puncture. ANESTHESIA/SEDATION: Versed 1 mg IV; Fentanyl 50 mcg IV; Moderate Sedation Time:  12 minutes The patient was continuously monitored during the procedure by the interventional radiology nurse under my direct supervision. FLUOROSCOPY TIME:  Fluoroscopy Time: 30 seconds (8 mGy). COMPLICATIONS: None immediate. PROCEDURE: Informed written consent was obtained from the patient after a discussion of the risks, benefits, and alternatives to treatment. Questions regarding the procedure were encouraged and answered. The right neck and chest were prepped with chlorhexidine in a sterile fashion, and a sterile drape was applied covering the operative field. Maximum barrier sterile technique with sterile gowns and gloves were used for the procedure. A timeout was performed prior to the initiation of the procedure. After creating a small venotomy incision, a micropuncture kit was utilized to access the internal jugular vein. Real-time ultrasound guidance was utilized for vascular access including the acquisition of a permanent ultrasound image documenting patency of the accessed vessel. The microwire was utilized to measure appropriate catheter length. A stiff Glidewire was advanced to the level of the IVC and the micropuncture sheath was exchanged for a peel-away sheath. A palindrome tunneled hemodialysis catheter measuring 19 cm from tip to cuff was tunneled in a retrograde fashion from the anterior chest wall to the venotomy incision. The catheter was then placed through the peel-away sheath with tips ultimately positioned within the superior aspect of the right atrium. Final catheter positioning was confirmed and documented with a spot radiographic  image. The catheter aspirates and flushes normally. The catheter was flushed with appropriate volume heparin dwells. The catheter exit site was secured with a 0-Prolene retention suture. The venotomy incision was closed with an interrupted 4-0 Vicryl, Dermabond and Steri-strips. Dressings were applied. The patient tolerated the procedure well without immediate post procedural complication. IMPRESSION: Successful placement of 19 cm tip to cuff tunneled hemodialysis catheter via the right internal jugular vein with tips terminating within the superior aspect of the right atrium. The catheter is ready for immediate use. Electronically Signed   By: Sandi Mariscal M.D.   On: 04/10/2017 09:03   Ir US Guide Vasc Access Left  Result Date: 04/05/2017 INDICATION: Chronic kidney disease and fluid overload. Patient needs urgent dialysis. Plan for non tunneled catheter followed by a tunneled catheter when the patient's INR level has normalized. EXAM: FLUOROSCOPIC AND ULTRASOUND GUIDED PLACEMENT OF A NON-TUNNELED DIALYSIS CATHETER Physician: Stephan Minister. Henn, MD MEDICATIONS: None ANESTHESIA/SEDATION: None FLUOROSCOPY TIME:  Fluoroscopy Time: 24 seconds, 5 mGy COMPLICATIONS: None immediate. PROCEDURE: Informed consent was obtained for catheter placement. The patient was placed supine on the interventional table. Ultrasound confirmed a  patent left internal jugular vein. Ultrasound images were obtained for documentation. The left side of the neck was prepped and draped in a sterile fashion. The left side of the neck was anesthetized with 1% lidocaine. Maximal barrier sterile technique was utilized including caps, mask, sterile gowns, sterile gloves, sterile drape, hand hygiene and skin antiseptic. A small incision was made with #11 blade scalpel. A 21 gauge needle directed into the left internal jugular vein with ultrasound guidance. A micropuncture dilator set was placed. A 20 cm Mahurkar catheter was selected. The catheter was  advanced over a wire and positioned at the superior cavoatrial junction. Fluoroscopic images were obtained for documentation. Both dialysis lumens were found to aspirate and flush well. The proper amount of heparin was flushed in both lumens. The central venous lumen was flushed with normal saline. Catheter was sutured to skin. FINDINGS: Catheter tip at the superior cavoatrial junction. IMPRESSION: Successful placement of a left jugular non-tunneled dialysis catheter using ultrasound and fluoroscopic guidance. Electronically Signed   By: Markus Daft M.D.   On: 04/05/2017 15:04   Ir US Guide Vasc Access Right  Result Date: 04/10/2017 INDICATION: End-stage renal disease. In need of tunneled dialysis catheter placement for continuation of dialysis. EXAM: TUNNELED CENTRAL VENOUS HEMODIALYSIS CATHETER PLACEMENT WITH ULTRASOUND AND FLUOROSCOPIC GUIDANCE MEDICATIONS: Ancef 2 gm IV . The antibiotic was given in an appropriate time interval prior to skin puncture. ANESTHESIA/SEDATION: Versed 1 mg IV; Fentanyl 50 mcg IV; Moderate Sedation Time:  12 minutes The patient was continuously monitored during the procedure by the interventional radiology nurse under my direct supervision. FLUOROSCOPY TIME:  Fluoroscopy Time: 30 seconds (8 mGy). COMPLICATIONS: None immediate. PROCEDURE: Informed written consent was obtained from the patient after a discussion of the risks, benefits, and alternatives to treatment. Questions regarding the procedure were encouraged and answered. The right neck and chest were prepped with chlorhexidine in a sterile fashion, and a sterile drape was applied covering the operative field. Maximum barrier sterile technique with sterile gowns and gloves were used for the procedure. A timeout was performed prior to the initiation of the procedure. After creating a small venotomy incision, a micropuncture kit was utilized to access the internal jugular vein. Real-time ultrasound guidance was utilized for  vascular access including the acquisition of a permanent ultrasound image documenting patency of the accessed vessel. The microwire was utilized to measure appropriate catheter length. A stiff Glidewire was advanced to the level of the IVC and the micropuncture sheath was exchanged for a peel-away sheath. A palindrome tunneled hemodialysis catheter measuring 19 cm from tip to cuff was tunneled in a retrograde fashion from the anterior chest wall to the venotomy incision. The catheter was then placed through the peel-away sheath with tips ultimately positioned within the superior aspect of the right atrium. Final catheter positioning was confirmed and documented with a spot radiographic image. The catheter aspirates and flushes normally. The catheter was flushed with appropriate volume heparin dwells. The catheter exit site was secured with a 0-Prolene retention suture. The venotomy incision was closed with an interrupted 4-0 Vicryl, Dermabond and Steri-strips. Dressings were applied. The patient tolerated the procedure well without immediate post procedural complication. IMPRESSION: Successful placement of 19 cm tip to cuff tunneled hemodialysis catheter via the right internal jugular vein with tips terminating within the superior aspect of the right atrium. The catheter is ready for immediate use. Electronically Signed   By: Sandi Mariscal M.D.   On: 04/10/2017 09:03   Dg Chest [facility]  1 View  Result Date: 04/05/2017 CLINICAL DATA:  Acute respiratory distress. EXAM: PORTABLE CHEST 1 VIEW COMPARISON:  04/02/2017 FINDINGS: Postoperative changes in the mediastinum. Cardiac pacemaker. Shallow inspiration. Cardiac enlargement. Diffuse pulmonary infiltrates, greater on the right. This likely represents edema although multifocal pneumonia could also have this appearance. Bilateral pleural effusions are probable. Progression since previous study. Calcified and tortuous aorta. Degenerative changes in the spine. Old right rib  fractures. IMPRESSION: Cardiac enlargement with diffuse pulmonary infiltrates, likely edema, and bilateral pleural effusions with progression since previous study. Electronically Signed   By: Lucienne Capers M.D.   On: 04/05/2017 01:06   Dg Chest Port 1 View  Result Date: 04/01/2017 CLINICAL DATA:  Shortness of breath, history of COPD, non-Hodgkin's lymphoma, previous CVA, diabetes, nonsmoker. EXAM: PORTABLE CHEST 1 VIEW COMPARISON:  Chest x-ray of March 29, 2017 FINDINGS: The lungs are mildly hypoinflated. The interstitial markings are increased bilaterally. There is a small right pleural effusion. The pulmonary vascularity is engorged. The cardiac silhouette is enlarged. The patient has undergone previous CABG. The ICD is in stable position. There is calcification in the wall of the aortic arch. The observed bony thorax is unremarkable. IMPRESSION: CHF with mild to moderate interstitial edema. Small right pleural effusion. No definite pneumonia. Thoracic aortic atherosclerosis. Electronically Signed   By: David  Martinique M.D.   On: 04/01/2017 09:15   Dg Abd Portable 1v  Result Date: 04/17/2017 CLINICAL DATA:  Abdominal pain.  Dialysis patient EXAM: PORTABLE ABDOMEN - 1 VIEW COMPARISON:  CT 09/28/2016 FINDINGS: Nonobstructive bowel gas pattern. Ill-defined density left upper quadrant could be contrast filled bowel loops or could be outside of the patient such is a foreign body. No recent CT scan has been done at this location. Chronic fracture with vertebroplasty L2. No acute skeletal abnormality. AICD. Central line in the right atrium for dialysis IMPRESSION: Nonobstructive bowel gas pattern. Ill-defined density left upper quadrant could be opacified bowel loops, question recent abdominal CT at outside location. Atherosclerotic disease Compression fracture and vertebroplasty L2 Electronically Signed   By: Franchot Gallo M.D.   On: 04/17/2017 10:04   Ir Thoracentesis Asp Pleural Space W/img  Guide  Result Date: 04/02/2017 INDICATION: Patient with right pleural effusion. Request is made for diagnostic and therapeutic right thoracentesis. EXAM: ULTRASOUND GUIDED DIAGNOSTIC AND THERAPEUTIC RIGHT THORACENTESIS MEDICATIONS: 10 mL 2% lidocaine COMPLICATIONS: None immediate. PROCEDURE: An ultrasound guided thoracentesis was thoroughly discussed with the patient and questions answered. The benefits, risks, alternatives and complications were also discussed. The patient understands and wishes to proceed with the procedure. Written consent was obtained. Ultrasound was performed to localize and mark an adequate pocket of fluid in the right chest. The area was then prepped and draped in the normal sterile fashion. 2% Lidocaine was used for local anesthesia. Under ultrasound guidance a Safe-T-Centesis catheter was introduced. Thoracentesis was performed. The catheter was removed and a dressing applied. FINDINGS: A total of approximately 900 mL of hazy, yellow fluid was removed. Samples were sent to the laboratory as requested by the clinical team. IMPRESSION: Successful ultrasound guided diagnostic and therapeutic right thoracentesis yielding 900 mL of pleural fluid. Read by:  Brynda Greathouse PA-C Electronically Signed   By: Aletta Edouard M.D.   On: 04/02/2017 16:13    Microbiology: No results found for this or any previous visit (from the past 240 hour(s)).   Labs: CBC: Recent Labs  Lab 04/14/17 0313 04/15/17 0514 04/16/17 0138 04/17/17 0302 04/18/17 0459  WBC 5.2 6.0 6.9 6.5 5.0  NEUTROABS  --   --   --   --  2.8  HGB 9.7* 9.8* 10.0* 9.7* 9.5*  HCT 30.4* 31.5* 33.1* 31.7* 30.8*  MCV 92.4 92.6 94.0 93.2 94.5  PLT 44* 66* 68* 91* 81*   Basic Metabolic Panel: Recent Labs  Lab 04/13/17 0326 04/17/17 0302 04/18/17 0459  NA 132* 133* 131*  K 4.1 4.1 3.7  CL 98* 96* 96*  CO2 '25 26 24  '$ GLUCOSE 161* 105* 100*  BUN 36* 41* 22*  CREATININE 3.15* 4.34* 3.13*  CALCIUM 8.1* 8.6* 8.1*  MG   --   --  1.8   Liver Function Tests: Recent Labs  Lab 04/13/17 0326 04/17/17 0930 04/18/17 0459  AST 33 45* 37  ALT 13* 7* 7*  ALKPHOS 98 102 94  BILITOT 0.8 1.3* 1.2  PROT 4.6* 5.4* 4.8*  ALBUMIN 2.2* 2.5* 2.3*   No results for input(s): LIPASE, AMYLASE in the last 168 hours. Recent Labs  Lab 04/17/17 0930 04/18/17 0459  AMMONIA 73* 78*   Cardiac Enzymes: No results for input(s): CKTOTAL, CKMB, CKMBINDEX, TROPONINI in the last 168 hours. BNP (last 3 results) Recent Labs    03/29/17 0947  BNP 1,158.1*   CBG: Recent Labs  Lab 04/18/17 1243 04/18/17 1637 04/18/17 2200 04/19/17 0728 04/19/17 1149  GLUCAP 106* 192* 198* 146* 169*   Time spent: 35 minutes  Signed:  Berle Mull  [facility]  04/19/2017  , 2:02 PM

## 2017-04-19 NOTE — Progress Notes (Signed)
CM consult for inpatient hospice. Coordinated by CSW, please follow their notes for disposition.

## 2017-04-19 NOTE — Progress Notes (Signed)
Pt discharged to [facility], report called to [place] at receiving facility. Pt drowsy upon departure, awakening to verbal stimuli. Husband aware of transfer. [facility] rep in around 11am to turn off ICD. IR unable to pull R chest HD line due to INR. Kila aware. PIVs removed. PTAR in to pick up pt, pt left unit via stretcher at 1650.

## 2017-04-19 NOTE — Progress Notes (Signed)
[facility] Liaison:  RN visit Received request from Destiny [name], for family interest in [facility] with request for transfer today.  Chart reviewed.  Met with Destiny [name], husband, to confirm interest and explain services.  Family agreeable to transfer today.  Destiny [name], is aware.  Registration paperwork completed at 1159am today.  Dr. Orpah Melter to assume care per family request.  Please fax discharge summary to [phone].  RN, please call report to [phone].  Please arrange transport for patient to arrive as soon as possible, after debibrillator is turned off.    Thank you for this referral.   Edyth Gunnels, RN, [facility] Liaison [phone]  All hospital liaisons are now on [place].

## 2017-04-24 ENCOUNTER — Encounter (HOSPITAL_COMMUNITY): Payer: Medicare Other

## 2017-05-14 DEATH — deceased

## 2017-05-16 ENCOUNTER — Ambulatory Visit: Payer: Medicare Other | Admitting: Family Medicine

## 2017-05-17 ENCOUNTER — Ambulatory Visit: Payer: Medicare Other | Admitting: Family Medicine

## 2017-05-24 ENCOUNTER — Encounter (HOSPITAL_COMMUNITY): Payer: Medicare Other

## 2017-05-28 ENCOUNTER — Ambulatory Visit: Payer: Medicare Other | Admitting: Vascular Surgery

## 2017-07-05 ENCOUNTER — Encounter: Payer: Medicare Other | Admitting: Internal Medicine

## 2017-08-07 IMAGING — CT CT ABD-PELV W/O CM
2 of 4 series · 17 of 46 positions shown, 19 images · non-contrast
Comparison: 09/04/2016.

CLINICAL DATA: Diarrhea over the last week. Bilateral lower
quadrant pain.

EXAM:
CT ABDOMEN AND PELVIS WITHOUT CONTRAST
TECHNIQUE: Multidetector CT imaging of the abdomen and pelvis was performed
following the standard protocol without IV contrast.

[Series 3: a/p w/o 5mm · axial · non-contrast · 0.98mm/px · z∈[-498,+2]mm · 14 of 110 slices shown, 16 images]
[im 5/110  soft-tissue]
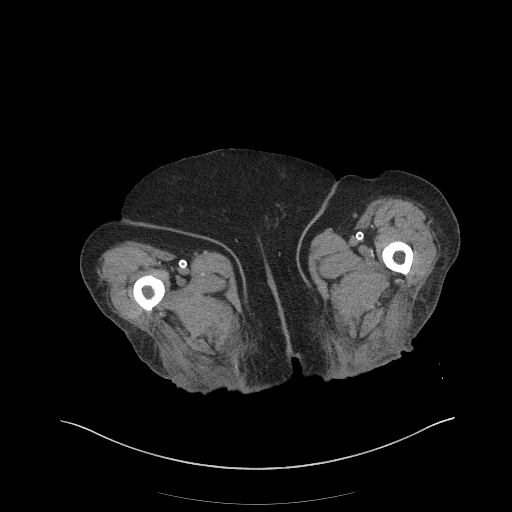
[im 5/110  bone]
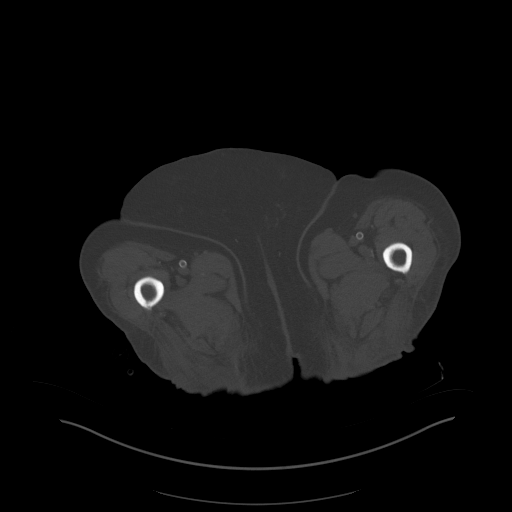
[im 14/110  soft-tissue]
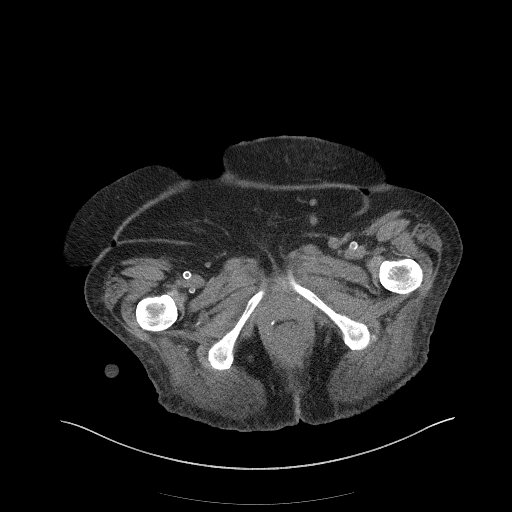
[im 23/110  soft-tissue]
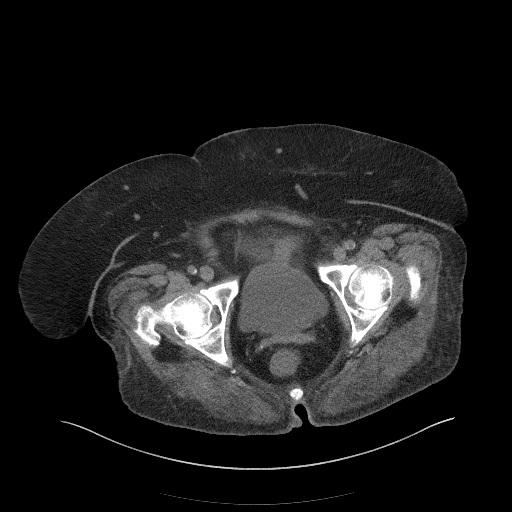
[im 28/110  soft-tissue]
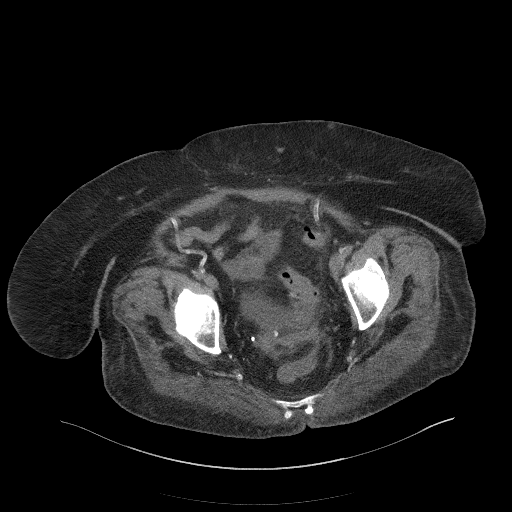
[im 37/110  soft-tissue]
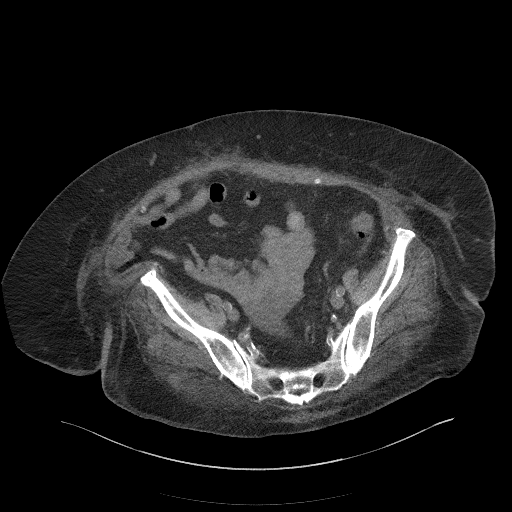
[im 46/110  soft-tissue]
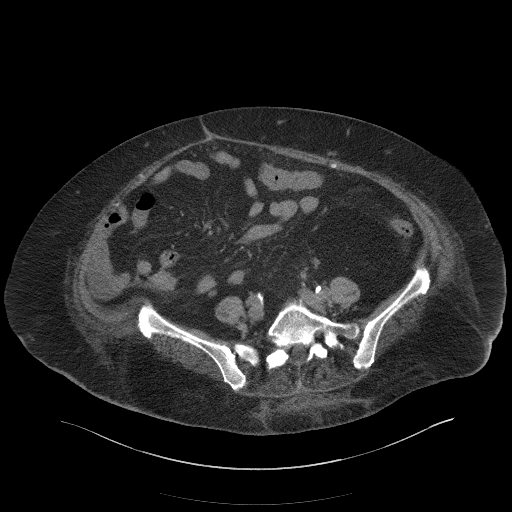
[im 50/110  soft-tissue]
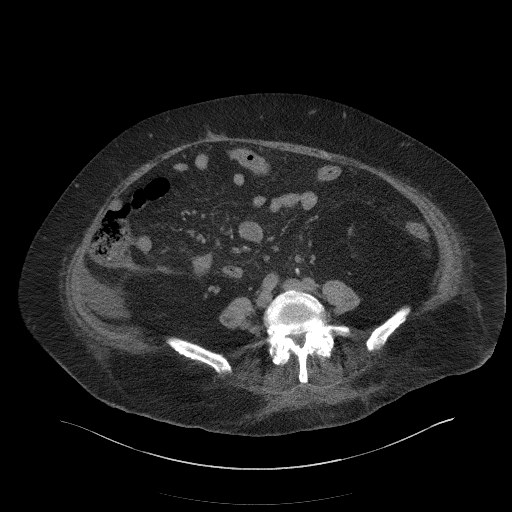
[im 60/110  soft-tissue]
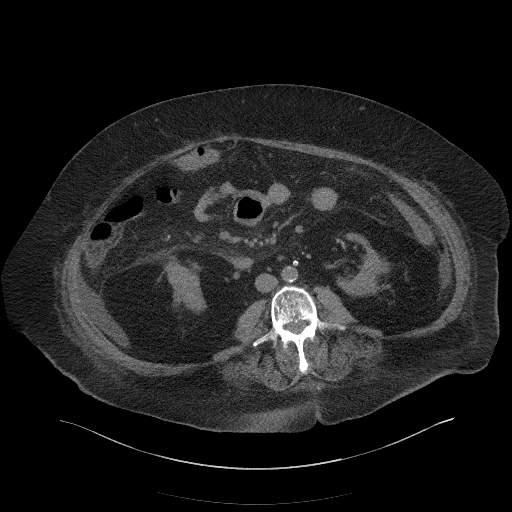
[im 64/110  soft-tissue]
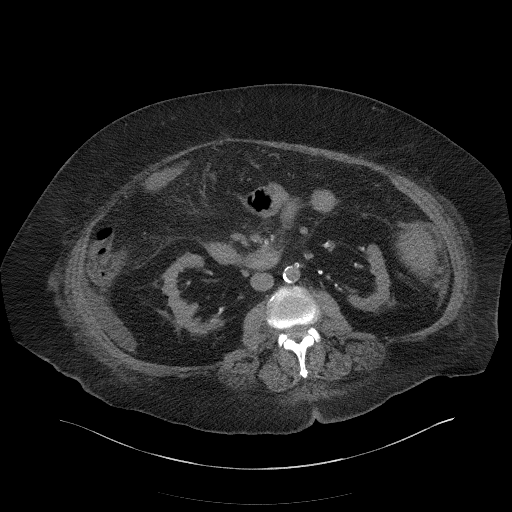
[im 64/110  bone]
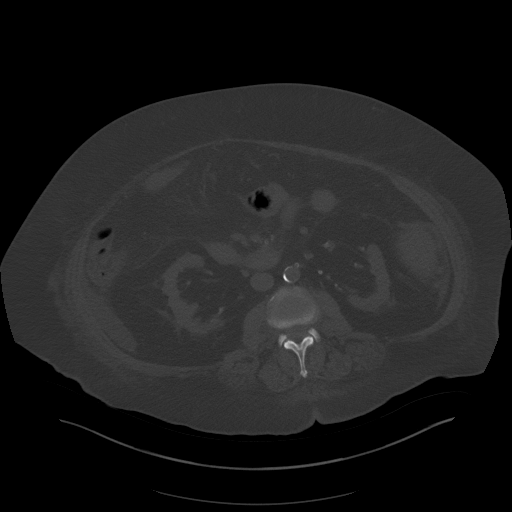
[im 73/110  soft-tissue]
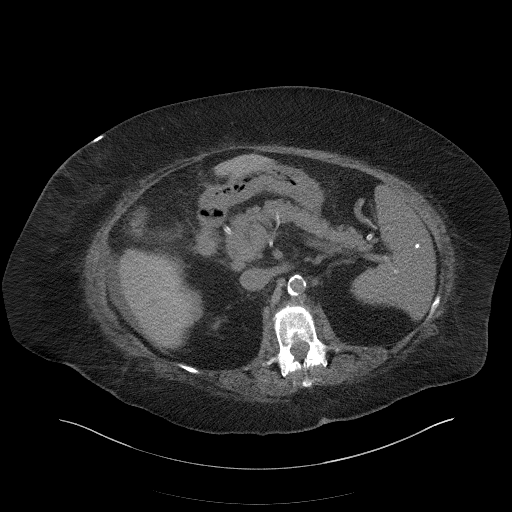
[im 82/110  soft-tissue]
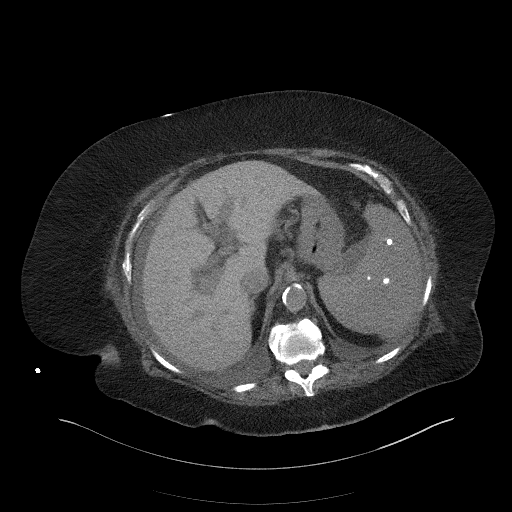
[im 87/110  soft-tissue]
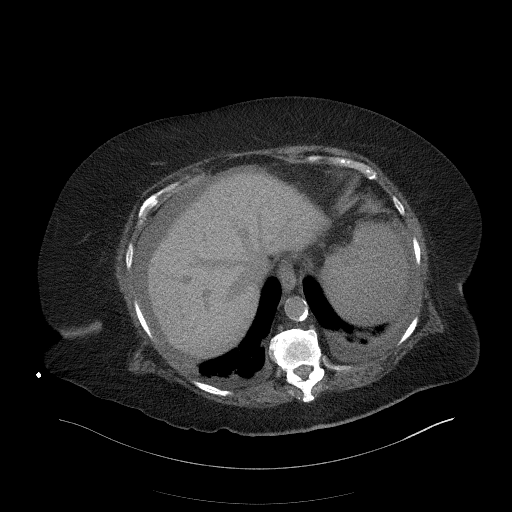
[im 96/110  soft-tissue]
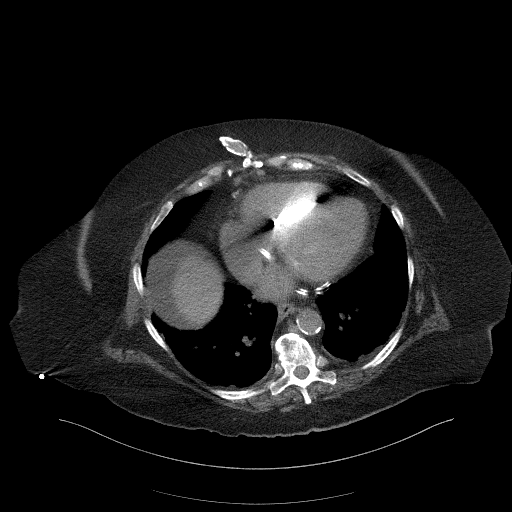
[im 105/110  soft-tissue]
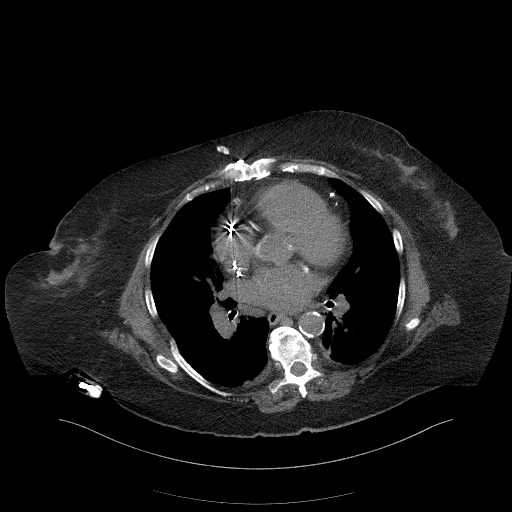

[Series 6: a/p w/o cor · coronal · non-contrast · 1.07mm/px · 3 of 180 slices shown]
[im 60/180  soft-tissue]
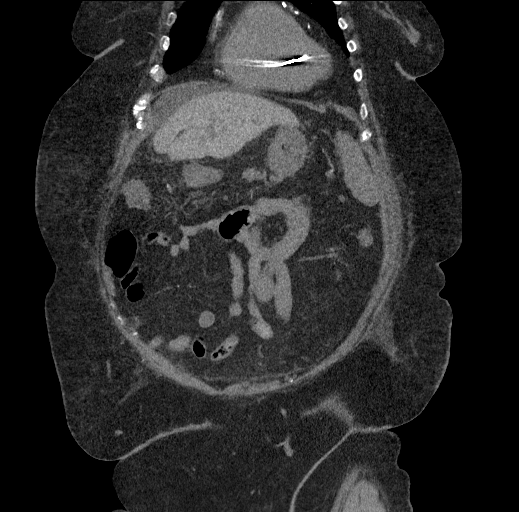
[im 80/180  soft-tissue]
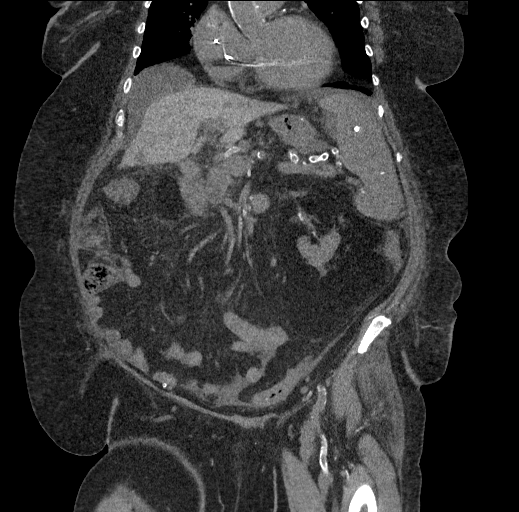
[im 100/180  soft-tissue]
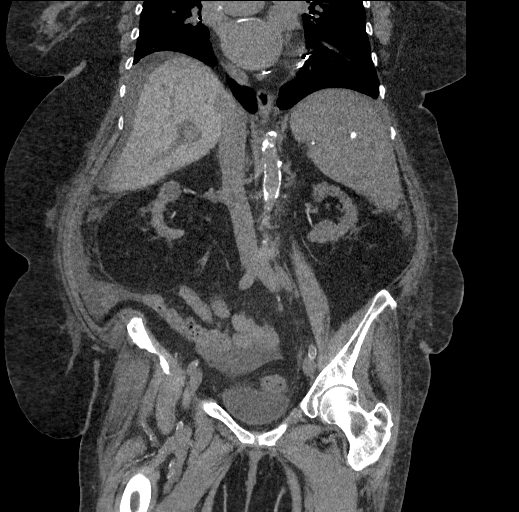

[17 of 46 positions shown; findings below may reference images not displayed]

FINDINGS: Lower chest: Small pleural effusions. Dependent pulmonary
atelectasis.

Hepatobiliary: No liver abnormality seen without contrast. Previous
cholecystectomy.

Pancreas: Normal

Spleen: Calcified granuloma is.  No significant finding.

Adrenals/Urinary Tract: Adrenal glands are normal. Kidneys show
chronic atrophy/ cortical volume loss. No sign of obstruction, stone
disease or mass lesion. 1 cm cyst at the lower pole on the left.
Bladder appears unremarkable.

Stomach/Bowel: Moderate amount of ascites, largely collected around
the liver and in the pelvis. There is diverticulosis, possibly with
mild diverticulitis in the sigmoid region. This is not definite as
there is some adjacent ascites. No small bowel abnormality is seen.

Vascular/Lymphatic: Aortic atherosclerosis. No aneurysm. Diffuse
vascular calcification typical of chronic diabetes. IVC is normal.
No retroperitoneal adenopathy.

Reproductive: Previous hysterectomy.  No pelvic mass.

Other: No free air.

Musculoskeletal: Chronic spinal degenerative changes. Previously
augmented fracture of L2.
IMPRESSION: Small to moderate amount of ascites, freely distributed. No free
air.

Diverticulosis of the sigmoid colon. Cannot rule out mild
diverticulitis. No advanced diverticulitis.

Aortic atherosclerosis. Diffuse vascular calcification typical of
diabetes.

Chronic renal atrophy.

Small pleural effusions with mild dependent atelectasis.
